# Patient Record
Sex: Female | Born: 1955 | Race: White | Marital: Married
Health system: Midwestern US, Academic
[De-identification: ages and names within clinical notes are randomized; demographics above are authoritative.]

## PROBLEM LIST (undated history)

## (undated) DIAGNOSIS — I5032 Chronic diastolic (congestive) heart failure: Principal | ICD-10-CM

## (undated) DIAGNOSIS — J302 Other seasonal allergic rhinitis: Secondary | ICD-10-CM

## (undated) DIAGNOSIS — N6452 Nipple discharge: Secondary | ICD-10-CM

## (undated) DIAGNOSIS — H8109 Meniere's disease, unspecified ear: Secondary | ICD-10-CM

## (undated) DIAGNOSIS — E1142 Type 2 diabetes mellitus with diabetic polyneuropathy: Secondary | ICD-10-CM

## (undated) DIAGNOSIS — K219 Gastro-esophageal reflux disease without esophagitis: Secondary | ICD-10-CM

## (undated) DIAGNOSIS — I10 Essential (primary) hypertension: Secondary | ICD-10-CM

## (undated) DIAGNOSIS — N1832 Chronic kidney disease, stage 3b (HCC): Secondary | ICD-10-CM

## (undated) DIAGNOSIS — H9311 Tinnitus, right ear: Secondary | ICD-10-CM

## (undated) DIAGNOSIS — R0602 Shortness of breath: Secondary | ICD-10-CM

## (undated) DIAGNOSIS — I2583 Coronary atherosclerosis due to lipid rich plaque: Secondary | ICD-10-CM

## (undated) DIAGNOSIS — G4710 Hypersomnia, unspecified: Secondary | ICD-10-CM

## (undated) DIAGNOSIS — K5900 Constipation, unspecified: Secondary | ICD-10-CM

## (undated) DIAGNOSIS — R0789 Other chest pain: Secondary | ICD-10-CM

## (undated) DIAGNOSIS — I2510 Atherosclerotic heart disease of native coronary artery without angina pectoris: Secondary | ICD-10-CM

## (undated) DIAGNOSIS — F419 Anxiety disorder, unspecified: Secondary | ICD-10-CM

## (undated) DIAGNOSIS — G4700 Insomnia, unspecified: Secondary | ICD-10-CM

## (undated) DIAGNOSIS — J309 Allergic rhinitis, unspecified: Secondary | ICD-10-CM

## (undated) DIAGNOSIS — C4359 Malignant melanoma of other part of trunk: Principal | ICD-10-CM

## (undated) DIAGNOSIS — F32A Depression, unspecified: Secondary | ICD-10-CM

## (undated) DIAGNOSIS — R9389 Abnormal findings on diagnostic imaging of other specified body structures: Secondary | ICD-10-CM

## (undated) DIAGNOSIS — I2089 Other forms of angina pectoris: Secondary | ICD-10-CM

## (undated) DIAGNOSIS — M51369 Other intervertebral disc degeneration, lumbar region without mention of lumbar back pain or lower extremity pain: Secondary | ICD-10-CM

## (undated) DIAGNOSIS — M545 Low back pain, unspecified: Secondary | ICD-10-CM

## (undated) DIAGNOSIS — R52 Pain, unspecified: Secondary | ICD-10-CM

## (undated) DIAGNOSIS — H9390 Unspecified disorder of ear, unspecified ear: Secondary | ICD-10-CM

## (undated) DIAGNOSIS — G609 Hereditary and idiopathic neuropathy, unspecified: Secondary | ICD-10-CM

## (undated) DIAGNOSIS — M5136 Other intervertebral disc degeneration, lumbar region: Secondary | ICD-10-CM

## (undated) DIAGNOSIS — E669 Obesity, unspecified: Secondary | ICD-10-CM

## (undated) DIAGNOSIS — M5134 Other intervertebral disc degeneration, thoracic region: Secondary | ICD-10-CM

## (undated) DIAGNOSIS — H6503 Acute serous otitis media, bilateral: Secondary | ICD-10-CM

## (undated) DIAGNOSIS — M5125 Other intervertebral disc displacement, thoracolumbar region: Secondary | ICD-10-CM

## (undated) DIAGNOSIS — L309 Dermatitis, unspecified: Secondary | ICD-10-CM

## (undated) DIAGNOSIS — N179 Acute kidney failure, unspecified: Secondary | ICD-10-CM

## (undated) DIAGNOSIS — E559 Vitamin D deficiency, unspecified: Secondary | ICD-10-CM

## (undated) DIAGNOSIS — M25511 Pain in right shoulder: Secondary | ICD-10-CM

## (undated) DIAGNOSIS — M791 Myalgia, unspecified site: Secondary | ICD-10-CM

## (undated) DIAGNOSIS — R112 Nausea with vomiting, unspecified: Secondary | ICD-10-CM

## (undated) DIAGNOSIS — Z1231 Encounter for screening mammogram for malignant neoplasm of breast: Secondary | ICD-10-CM

## (undated) DIAGNOSIS — R109 Unspecified abdominal pain: Secondary | ICD-10-CM

## (undated) DIAGNOSIS — M533 Sacrococcygeal disorders, not elsewhere classified: Secondary | ICD-10-CM

## (undated) DIAGNOSIS — S93492A Sprain of other ligament of left ankle, initial encounter: Secondary | ICD-10-CM

## (undated) DIAGNOSIS — E785 Hyperlipidemia, unspecified: Secondary | ICD-10-CM

## (undated) DIAGNOSIS — K9509 Other complications of gastric band procedure: Secondary | ICD-10-CM

## (undated) DIAGNOSIS — R899 Unspecified abnormal finding in specimens from other organs, systems and tissues: Secondary | ICD-10-CM

## (undated) DIAGNOSIS — L304 Erythema intertrigo: Secondary | ICD-10-CM

## (undated) DIAGNOSIS — M7918 Myalgia, other site: Secondary | ICD-10-CM

## (undated) DIAGNOSIS — Z6841 Body Mass Index (BMI) 40.0 and over, adult: Principal | ICD-10-CM

## (undated) DIAGNOSIS — R079 Chest pain, unspecified: Secondary | ICD-10-CM

## (undated) DIAGNOSIS — J069 Acute upper respiratory infection, unspecified: Secondary | ICD-10-CM

## (undated) DIAGNOSIS — M5124 Other intervertebral disc displacement, thoracic region: Secondary | ICD-10-CM

## (undated) DIAGNOSIS — N1831 Chronic kidney disease, stage 3a (HCC): Principal | ICD-10-CM

## (undated) DIAGNOSIS — Z1211 Encounter for screening for malignant neoplasm of colon: Secondary | ICD-10-CM

## (undated) DIAGNOSIS — M549 Dorsalgia, unspecified: Secondary | ICD-10-CM

## (undated) DIAGNOSIS — G8929 Other chronic pain: Secondary | ICD-10-CM

## (undated) DIAGNOSIS — M79671 Pain in right foot: Secondary | ICD-10-CM

## (undated) DIAGNOSIS — H60312 Diffuse otitis externa, left ear: Secondary | ICD-10-CM

## (undated) DIAGNOSIS — M79642 Pain in left hand: Secondary | ICD-10-CM

## (undated) DIAGNOSIS — I25118 Atherosclerotic heart disease of native coronary artery with other forms of angina pectoris: Secondary | ICD-10-CM

## (undated) DIAGNOSIS — R509 Fever, unspecified: Secondary | ICD-10-CM

## (undated) DIAGNOSIS — S62102D Fracture of unspecified carpal bone, left wrist, subsequent encounter for fracture with routine healing: Secondary | ICD-10-CM

## (undated) DIAGNOSIS — I208 Other forms of angina pectoris: Secondary | ICD-10-CM

## (undated) DIAGNOSIS — B372 Candidiasis of skin and nail: Secondary | ICD-10-CM

## (undated) DIAGNOSIS — E119 Type 2 diabetes mellitus without complications: Secondary | ICD-10-CM

## (undated) LAB — EKG 12-LEAD
Atrial Rate: 70 {beats}/min
P Axis: 54 degrees
P-R Interval: 136 ms
Q-T Interval: 432 ms
QRS Duration: 76 ms
QTc Calculation (Bazett): 466 ms
R Axis: -5 degrees
T Axis: 48 degrees
Ventricular Rate: 70 {beats}/min

## (undated) MED FILL — HYDROcodone/APAP 5-325MG TAB: 5-325 MG | 5 days supply | Qty: 20 | Fill #0 | Status: AC

## (undated) MED FILL — LINEZOLID 600MG TABS: 600 MG | 10 days supply | Qty: 20 | Fill #0 | Status: AC

---

## 2006-06-06 ENCOUNTER — Emergency Department (HOSPITAL_COMMUNITY): Admission: EM | Admit: 2006-06-06 | Discharge: 2006-06-06 | Payer: Self-pay | Admitting: Emergency Medicine

## 2008-10-27 ENCOUNTER — Emergency Department (HOSPITAL_BASED_OUTPATIENT_CLINIC_OR_DEPARTMENT_OTHER): Admission: EM | Admit: 2008-10-27 | Discharge: 2008-10-27 | Payer: Self-pay | Admitting: Emergency Medicine

## 2009-01-23 ENCOUNTER — Encounter

## 2009-01-23 NOTE — Progress Notes (Signed)
The pt is here for a f/u visit s/p adj band 9 months ago.  She started having abd pain on her right side and some vomiting at evening meal time.  After further history she is eating much too quickly at dinner time causing her occassional nausea.  Her hx of pain is c/w a pulled muscle esp. This far out from surgery.  She is still smoking which I explianed will substantially increase her risk of ulcer and erosion.      ON exam:  Her abdomen is soft and nontender.  The pain is not reproducible with palpation.  She states it hurts when she moves and lifts/moves heavy objects.  There is no erythema or wound infection which would indicate erosion.        A/P.  She was given dietary counseling re:  slowiong her eating down and told to contact us if her pain does not start improving.  We will keep her visit for 3 months from now to reasses.Marland Kitchen

## 2009-02-21 NOTE — Progress Notes (Signed)
Sheritta gained 0.6 lbs over 1 month. Healthy behaviors: portion control. Goal: Refocus. Replace protein shake drunk every day with protein-rich foods. Eat frequently, every 3-4 hours so as not to feel famished and want to overeat at night. Recommended checking out www.bariatriceating.com and www.realizemysuccess.com for recipe ideas. Recommended using the Realize website to document food intake to have analyzed by dietitians to improve weight loss outcomes. Handouts given: none. Will follow up as necessary.

## 2009-02-21 NOTE — Progress Notes (Signed)
The patient is here for a 10 month follow up status adj band.  She is having decreased restriction and increased hunger.  She underwent 30 minutes of dietary counseling and I have discussed, reviewed and agree with the dietary plan of action. She will need a fill today.  F/u in May at 1 year    The port was identified by palpation and needle aspiration to confirm needle placement in reservoir.  The fluid aspirated and injected easily.  0.5ccs were added to the band.  The patient was given dietary instructions and was able to tolerate water before leaving the office.  The patient was instructed to call for any problems.

## 2009-05-02 NOTE — Progress Notes (Signed)
Dietary Assessment Note    Vitals: Filed Vitals:    05/02/2009  2:27 PM   BP: 146/73   Pulse: 76   Height: 5\' 6"  (1.676 m)   Weight: 242 lb 5 oz (109.912 kg)      Patient lost 2.5 lbs over 2 months.    Labs reviewed: no lab studies available for review at time of visit    Protein intake: 60-80 grams/day     Fluid intake: 48-64 oz/day    Multivitamin/mineral intake: equate - how many/day: 2    Calcium intake: 1000 mg    Other: none    Exercise: Yes. How much: 2-3.5 miles a day. What kind: walking    Nutrition Assessment: 1 year post-op visit. Is engaging in some emotional eating around stress in her life. Was given a referral to a psychologist for emotional eating issues.    Food Intolerances/issues: none    Client Concerns: slow weight loss    Goals: Try fiber supplement or pearls IC     Plan: Will follow up in 3 months or as necessary.      Wilson Singer

## 2009-05-02 NOTE — Progress Notes (Signed)
The patient is here for a 1 year follow up status adj band.  She is doing well with diet, except for some emotional eating.  We have referred her to a outpatient psych for counseling.  Getting around 60gm of protein.  Exercise is 4-5 times /week.  She underwent 30 minutes of dietary counseling and I have discussed, reviewed and agree with the dietary plan of action.  She is having decreased restriction and increased hunger.  She will need a fill today.    She is off all her medications.      She will f/u in 6 months.  I spent over of counseling time with the patient.

## 2009-05-02 NOTE — Progress Notes (Signed)
Discussed referral to psychologist, patient agrees.  Also recommended f/u with PCP to discuss smoking cessation, possible use of Chantix.

## 2009-05-03 LAB — COMPREHENSIVE METABOLIC PANEL
ALT: 34 U/L (ref 10–40)
AST: 26 U/L (ref 15–37)
Albumin/Globulin Ratio: 1.4 (ref 1.1–2.2)
Albumin: 4.4 g/dL (ref 3.4–5.0)
Alkaline Phosphatase: 103 U/L — ABNORMAL HIGH (ref 25–100)
BUN: 15 mg/dL (ref 7–18)
CO2: 27 meq/L (ref 21–32)
Calcium: 10.1 mg/dL (ref 8.3–10.6)
Chloride: 108 meq/L (ref 99–110)
Creatinine: 0.9 mg/dL (ref 0.6–1.1)
GFR Est, African/Amer: >60
GFR, Estimated: >60 (ref >60)
Glucose: 86 mg/dL (ref 70–99)
Potassium: 4.1 meq/L (ref 3.5–5.1)
Sodium: 143 meq/L (ref 136–145)
Total Bilirubin: 0.5 mg/dL (ref 0.0–1.0)
Total Protein: 7.6 g/dL (ref 6.4–8.2)

## 2009-05-03 LAB — CBC
Hematocrit: 43.2 % (ref 36.0–48.0)
Hemoglobin: 14.4 g/dL (ref 12.0–16.0)
MCH: 28.8 pg (ref 26–34)
MCHC: 33.4 g/dL (ref 31–36)
MCV: 86.3 fl (ref 80–100)
MPV: 9.2 fl (ref 5.0–10.5)
Platelets: 208 10*3 (ref 135–450)
RBC: 5.01 10*6 (ref 4.0–5.2)
RDW: 14.1 % (ref 11.5–14.5)
WBC: 9.9 10*3 (ref 4.0–11.0)

## 2009-05-03 LAB — LIPID PANEL
Cholesterol, Total: 216 mg/dl — ABNORMAL HIGH (ref <200)
HDL: 53 mg/dL (ref 40–60)
LDL Calculated: 136 mg/dl — ABNORMAL HIGH (ref <100)
Triglycerides: 135 mg/dl (ref <150)
VLDL Cholesterol Calculated: 27 mg/dl

## 2009-05-03 LAB — IRON AND TIBC
Iron % Saturation: 18 % (ref 15–50)
Iron: 72 ug/dL (ref 35–150)
TIBC: 394 ug/dL (ref 260–445)

## 2009-05-03 LAB — FERRITIN: Ferritin: 61.9 ng/mL (ref 10–291)

## 2009-05-03 LAB — TSH: TSH: 1.71 u[IU]/mL (ref 0.35–5.5)

## 2009-05-03 LAB — VITAMIN B12 & FOLATE
Folate: >24 ng/ml
Vitamin B-12: 1809 pg/mL — ABNORMAL HIGH (ref 211–911)

## 2009-05-03 LAB — HEMOGLOBIN A1C
A1c: 5.3 (ref 4.0–6.0)
eAG: 105.4 mg/dl

## 2009-05-06 LAB — VITAMIN A
RETINYL PALMITATE: 0.03 mg/L (ref 0.00–0.10)
Vitamin A, Interp: NORMAL
Vitamin A: 0.67 mg/L (ref 0.30–1.20)

## 2009-05-06 LAB — ZINC: Zinc: 80 ug/dL (ref 60–120)

## 2009-05-06 LAB — VITAMIN D 25 HYDROXY: Vit D, 25-Hydroxy: 29 ng/mL — ABNORMAL LOW (ref 30–80)

## 2009-05-07 LAB — VITAMIN B1: Vit. B1, Plasma: 21 nmol/L (ref 8–30)

## 2009-07-07 DEATH — deceased

## 2009-08-07 NOTE — Progress Notes (Signed)
The patient is a 53 y.o. female who returns today for follow up. We discussed how her weight affects her overall health including:  Patient Active Problem List   Diagnoses Code   ??? Back Pain 724.5E   ??? Deep Vein Thrombosis 453.40J   ??? Depression 311L   ??? GERD (Gastroesophageal Reflux Disease) 530.81S   ??? High Cholesterol 272.0AY   ??? Hypertension 401.9AJ   ??? Pulmonary Embolism 415.19AD   ??? Nausea & Vomiting 787.01H   ??? Abdominal  Pain, Other Specified Site 789.09   ??? Morbid Obesity 278.01        The patient's current Body mass index is 39.71 kg/(m^2). (08/07/2009).  Since her last visit she has gained 3.5 lbs over 4 months. Zoua underwent thirty minutes of dietary counseling, and I have reviewed and agree with the dietary counseling, and I have reviewed and agree with the diet plan. There are no changes in the patients medical history or physical exam.   I spent a total of 15 minutes with the a patient and  Greater than 50% was face to face counseling time with the patient to discuss the issues covered in this note.    She has had some emontional eating issues with her mother passing away 1 1/2 months ago.  Not getting enough protein but exercinsg a great deal.  Needs to quit smoking and tighten up diet a bit.      The patient will follow up in 2 months

## 2009-08-07 NOTE — Progress Notes (Signed)
Dietary Assessment Note    Vitals: Filed Vitals:    08/07/2009 10:35 AM   BP: 161/89   Pulse: 74   Resp: 18   Height: 5\' 6"  (1.676 m)   Weight: 246 lb (111.585 kg)      Patient gained 3.5 lbs over 3 months.    Labs reviewed: no lab studies available for review at time of visit    Protein intake: Patient not tracking     Fluid intake: 48-64 oz/day    Multivitamin/mineral intake: Centrum equivalent - how many/day: 2    Calcium intake: 1000 mg calcium citrate    Other: Vitamin D3 and Citrucel    Exercise: Yes. How much: 3-4. What kind: treadmill and weights and racquetball.    Nutrition Assessment: band fill visit. Not tracking protein. Is craving sweets. Fluid intake optimal and compliant with MV/M.    Food Intolerances/issues: breads    Client Concerns: slow weight loss    Goals: Track protein, eliminate sweets    Plan: Will follow up as necessary.      Wilson Singer

## 2009-10-17 LAB — CBC
Hematocrit: 43.3 % (ref 36.0–48.0)
Hemoglobin: 14.6 g/dL (ref 12.0–16.0)
MCH: 29.1 pg (ref 26–34)
MCHC: 33.7 g/dL (ref 31–36)
MCV: 86.4 fl (ref 80–100)
MPV: 9.8 fl (ref 5.0–10.5)
Platelets: 209 10*3 (ref 135–450)
RBC: 5.01 10*6 (ref 4.0–5.2)
RDW: 14.3 % (ref 11.5–14.5)
WBC: 9.2 10*3 (ref 4.0–11.0)

## 2009-10-17 LAB — PHOSPHORUS: Phosphorus: 3.7 mg/dL (ref 2.5–4.9)

## 2009-10-17 LAB — TSH
T4 Free: 1.12 ng/dL (ref 0.9–1.8)
TSH: 1.45 u[IU]/mL (ref 0.35–5.5)

## 2009-10-17 LAB — LIPID PANEL
Cholesterol, Total: 224 mg/dl — ABNORMAL HIGH (ref <200)
HDL: 55 mg/dL (ref 40–60)
LDL Calculated: 139 mg/dl — ABNORMAL HIGH (ref <100)
Triglycerides: 155 mg/dl — ABNORMAL HIGH (ref <150)
VLDL Cholesterol Calculated: 31 mg/dl

## 2009-10-17 LAB — BASIC METABOLIC PANEL
BUN: 12 mg/dL (ref 7–18)
CO2: 29 meq/L (ref 21–32)
Calcium: 10.6 mg/dL (ref 8.3–10.6)
Chloride: 107 meq/L (ref 99–110)
Creatinine: 0.9 mg/dL (ref 0.6–1.1)
GFR Est, African/Amer: >60
GFR, Estimated: >60 (ref >60)
Glucose: 89 mg/dL (ref 70–99)
Potassium: 4.6 meq/L (ref 3.5–5.1)
Sodium: 144 meq/L (ref 136–145)

## 2009-10-17 LAB — HEPATIC FUNCTION PANEL
ALT: 35 U/L (ref 10–40)
AST: 27 U/L (ref 15–37)
Albumin: 4.5 g/dL (ref 3.4–5.0)
Alkaline Phosphatase: 109 U/L
Bilirubin, Direct: 0.14 mg/dL (ref 0.0–0.3)
Bilirubin, Indirect: 0.4 mg/dL (ref 0.0–1.0)
Total Bilirubin: 0.5 mg/dL (ref 0.0–1.0)
Total Protein: 7.6 g/dL (ref 6.4–8.2)

## 2009-10-17 LAB — T4, FREE: T4 Free: 1.12 ng/dL (ref 0.9–1.8)

## 2009-10-17 LAB — VITAMIN B12 & FOLATE
Folate: >24 ng/ml
Vitamin B-12: 955 pg/mL — ABNORMAL HIGH (ref 211–911)

## 2009-10-20 LAB — VITAMIN D 25 HYDROXY: Vit D, 25-Hydroxy: 33 ng/mL (ref 30–80)

## 2009-10-23 MED ORDER — TECHNETIUM TC 99M SESTAMIBI - CARDIOLITE
Freq: Once | Status: AC
Start: 2009-10-23 — End: 2009-10-23
  Administered 2009-10-23: 15:00:00 30 via INTRAVENOUS

## 2009-10-23 MED ORDER — TECHNETIUM TC 99M TETROFOSMIN IV KIT
Freq: Once | INTRAVENOUS | Status: AC
Start: 2009-10-23 — End: 2009-10-23
  Administered 2009-10-23: 14:00:00 10 via INTRAVENOUS

## 2009-10-23 NOTE — Progress Notes (Signed)
Stress test complete. Patient tolerated well. Windi Toro L Sanii Kukla,RN

## 2009-10-23 NOTE — Progress Notes (Signed)
Patient instructed on lexiscan protocol stress test procedure including possible side effects and adverse reactions. Patient verbalizes knowledge and understanding and denies having any questions.  Lynnleigh Soden L Jacoby Ritsema, RN

## 2009-10-24 MED FILL — NORMAL SALINE FLUSH 0.9 % IJ SOLN: 0.9 % | INTRAMUSCULAR | Qty: 10

## 2009-10-25 MED FILL — LEXISCAN 0.4 MG/5ML IV SOLN: 0.4 MG/5ML | INTRAVENOUS | Qty: 5

## 2009-11-07 NOTE — Progress Notes (Signed)
Dietary Assessment Note    Vitals: Filed Vitals:    11/07/2009  3:33 PM   BP: 157/90   Pulse: 81   Resp: 16   Height: 5\' 6"  (1.676 m)   Weight: 243 lb (110.224 kg)      Patient lost 3 lbs over the past three months.    Labs reviewed: labs are reviewed, up to date and normal    Protein intake: <60 grams/day     Fluid intake: 48-64 oz/day    Multivitamin/mineral intake: 2 MVI a day    Calcium intake: calcium citrate 1200mg     Other: none    Exercise: No. How much: NA. What kind: NA    Nutrition Assessment: Pt states she is very hungry and is not feeling any restriction. She states her sleeping pattern is off and she is eating larger amounts much less often than before. Her protein intake is not adequate and she is noticing some hair thinning. Encouraged pt to eat 4-5 times a day no more than 4 hours apart. Pt states she is drinking about one pop per week.    Food Intolerances/issues: hard breads    Client Concerns: not feeling restriction- fears band is almost full    Goals: cut out pop, take 30 mins to eat, eat 4-5 times a day    Plan: follow up as needed      Allin Frix [facility]

## 2009-11-07 NOTE — Progress Notes (Signed)
The patient is here for post-operative visit following a 19 month follow up status adj band.  She has been having a great deal of stress.  She has started smoking again.  She is drinking diet soda a great deal and is not exercising much.  She underwent an evaluation for cardiac problems due to her stress.  Her work up was negative (per patient)  We discussed how her weight affects their health and possible medical problems, including.    Past Medical History   Diagnosis Date   . Chronic back pain    . Deep vein thrombosis    . Depression    . GERD (gastroesophageal reflux disease)    . Hyperlipidemia    . HYPERTENSION    . Pulmonary embolism    . Obesity          Filed Vitals:    11/07/2009  3:33 PM   BP: 157/90   Pulse: 81   Resp: 16         Abdomen:  Soft NT/ND, no rebound/gaurding.  Wounds healing well.      The patient underwent 30 minutes of dietary counseling and I have discussed, reviewed and agree with the dietary plan of action.   We discussed the importance of following the dietary recommendations.  Also, we discussed the benefits of attending support groups and the online support program.    All questions were answered.    The patient will follow up in 2 months and have reviewed blood work.    I spent over 20 minutes of face to face counseling time with the patient.

## 2010-02-06 IMAGING — CR DG HIP (WITH OR WITHOUT PELVIS) 2-3V*L*
3 series · 3 of 3 positions shown · non-contrast
Comparison: None

CLINICAL DATA: Left leg pain.  Fall.

LEFT HIP - COMPLETE 2+ VIEW

[t pelvis a.p.]
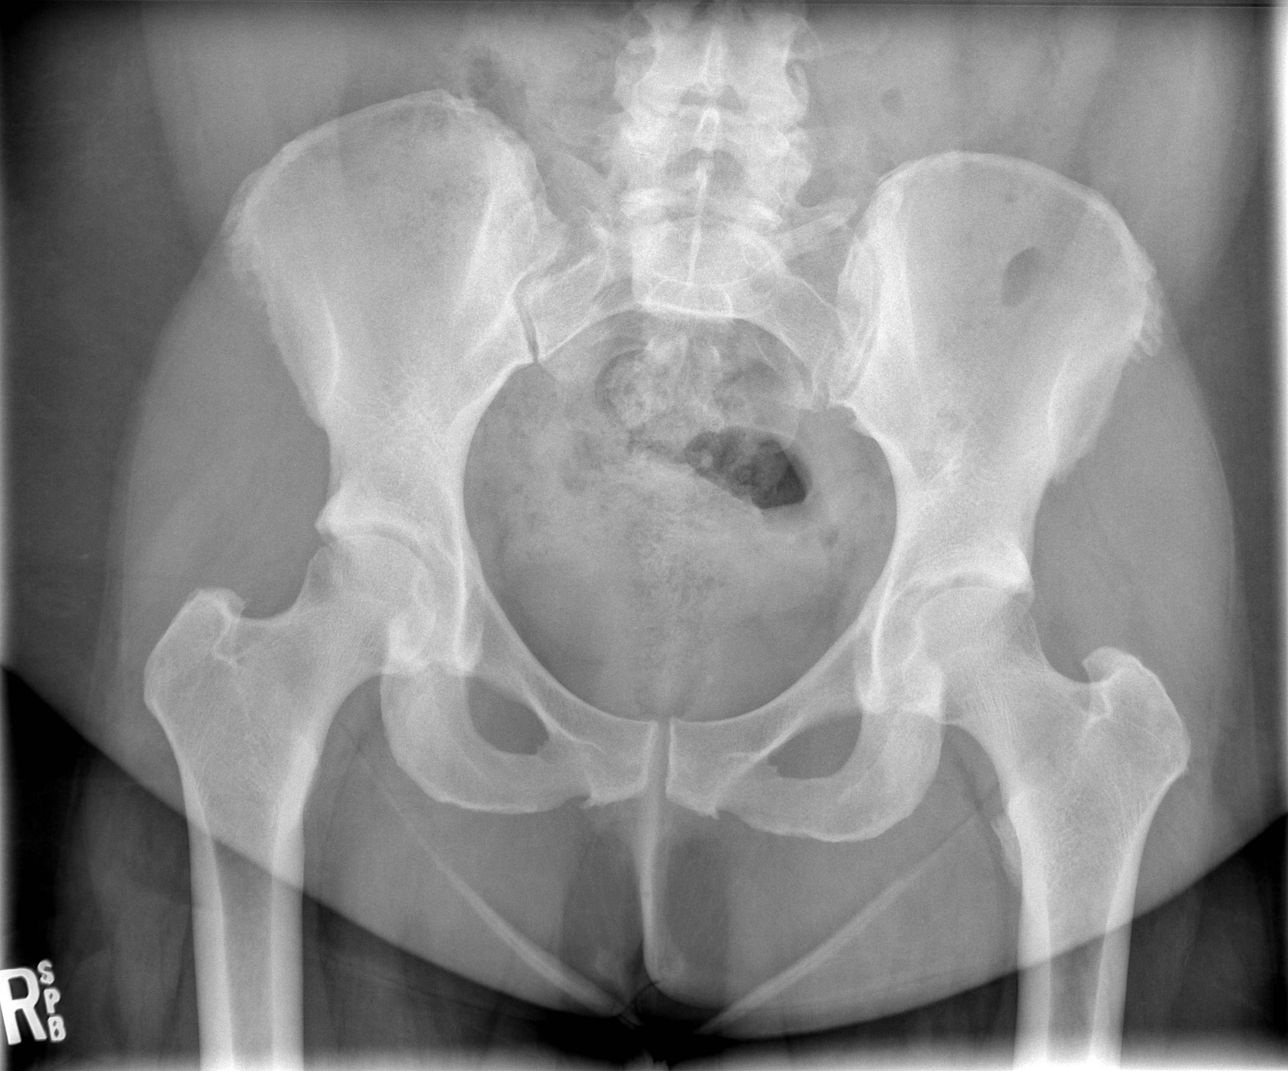

[t hip ap left]
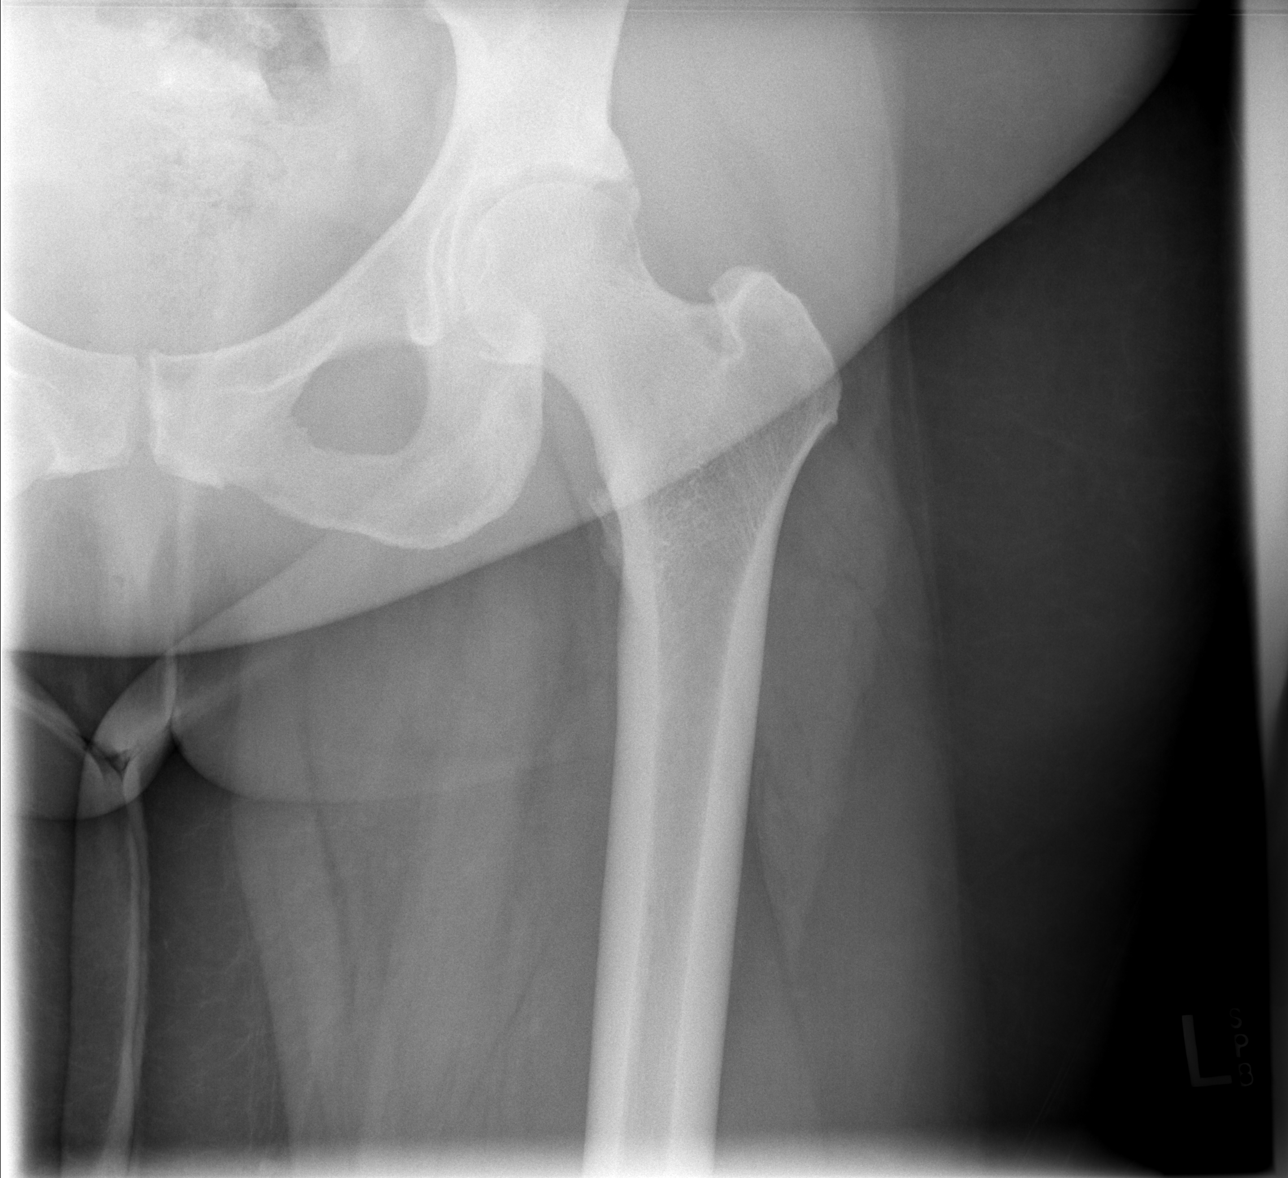

[t hip frog leg left]
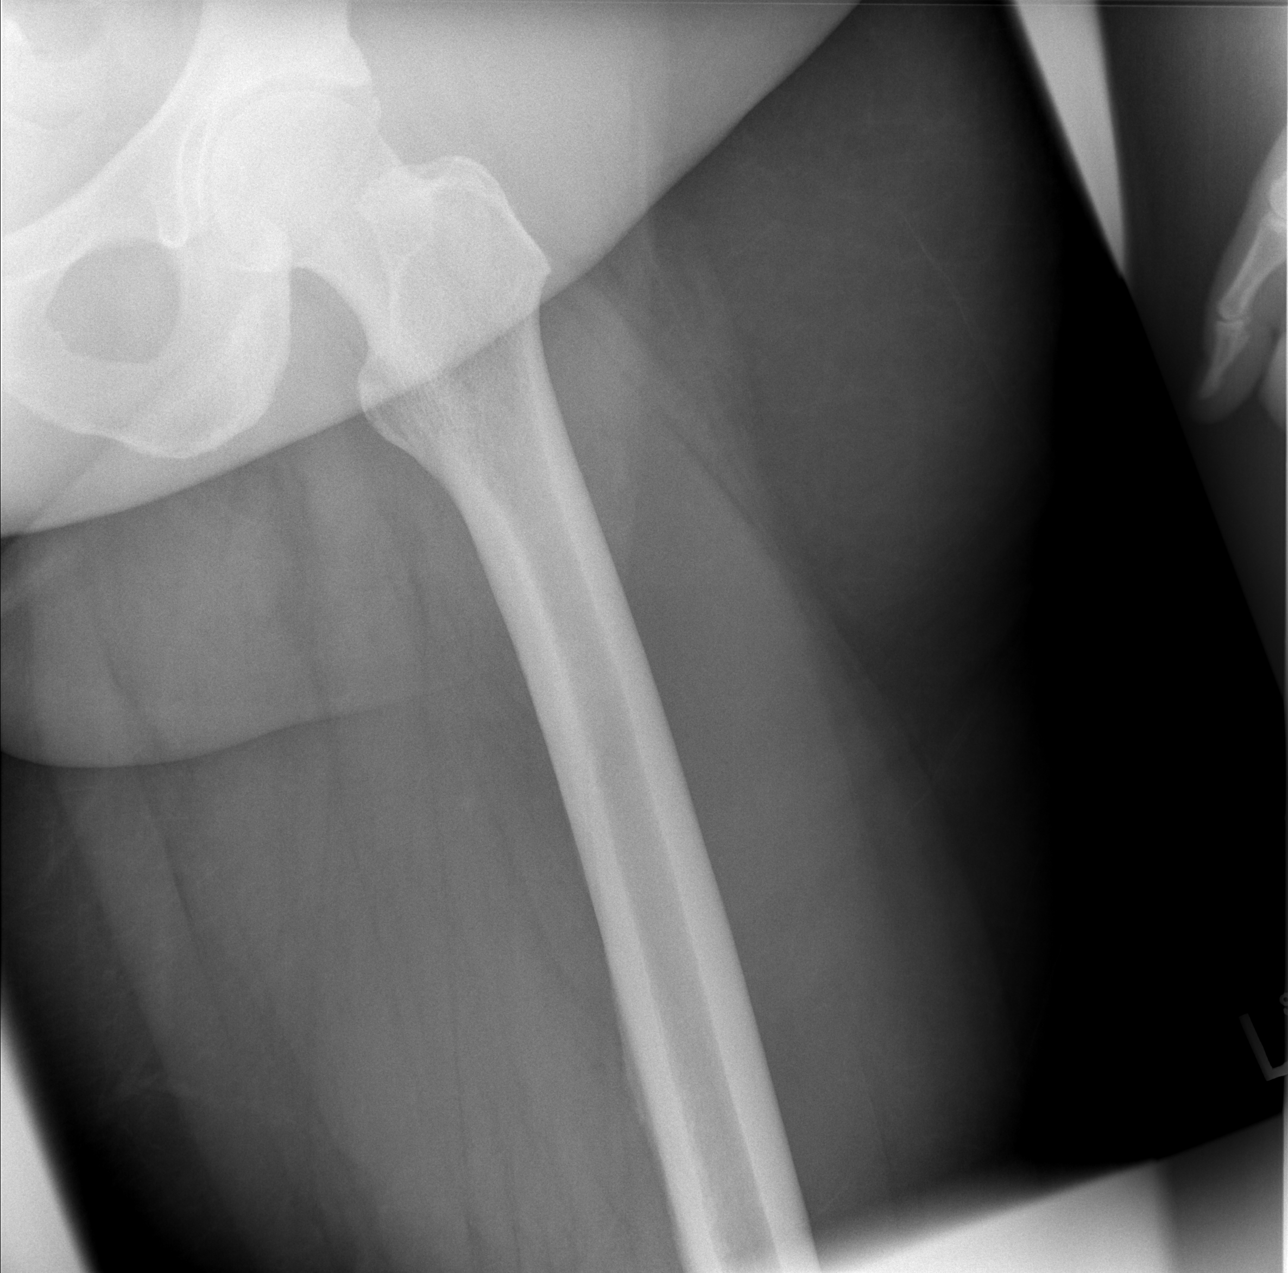

[3 of 3 positions shown; findings below may reference images not displayed]

FINDINGS: Pelvic rings appear intact.  Both hips appear located
with symmetric joint spaces.  Enthesopathy is noted at the
parasymphyseal pubis and iliac crests bilaterally.  The cortical
irregularity is present along the left lesser trochanter on the
frontal view but this appears normal on the lateral view.  No
fracture is identified.
IMPRESSION: No acute osseous trauma to the pelvis or left hip.

## 2010-02-06 IMAGING — CR DG KNEE COMPLETE 4+V*L*
4 series · 4 of 4 positions shown · non-contrast
Comparison: None available

CLINICAL DATA: Fall off ladder.  Left leg pain.

LEFT KNEE - COMPLETE 4+ VIEW

[t knee ap left]
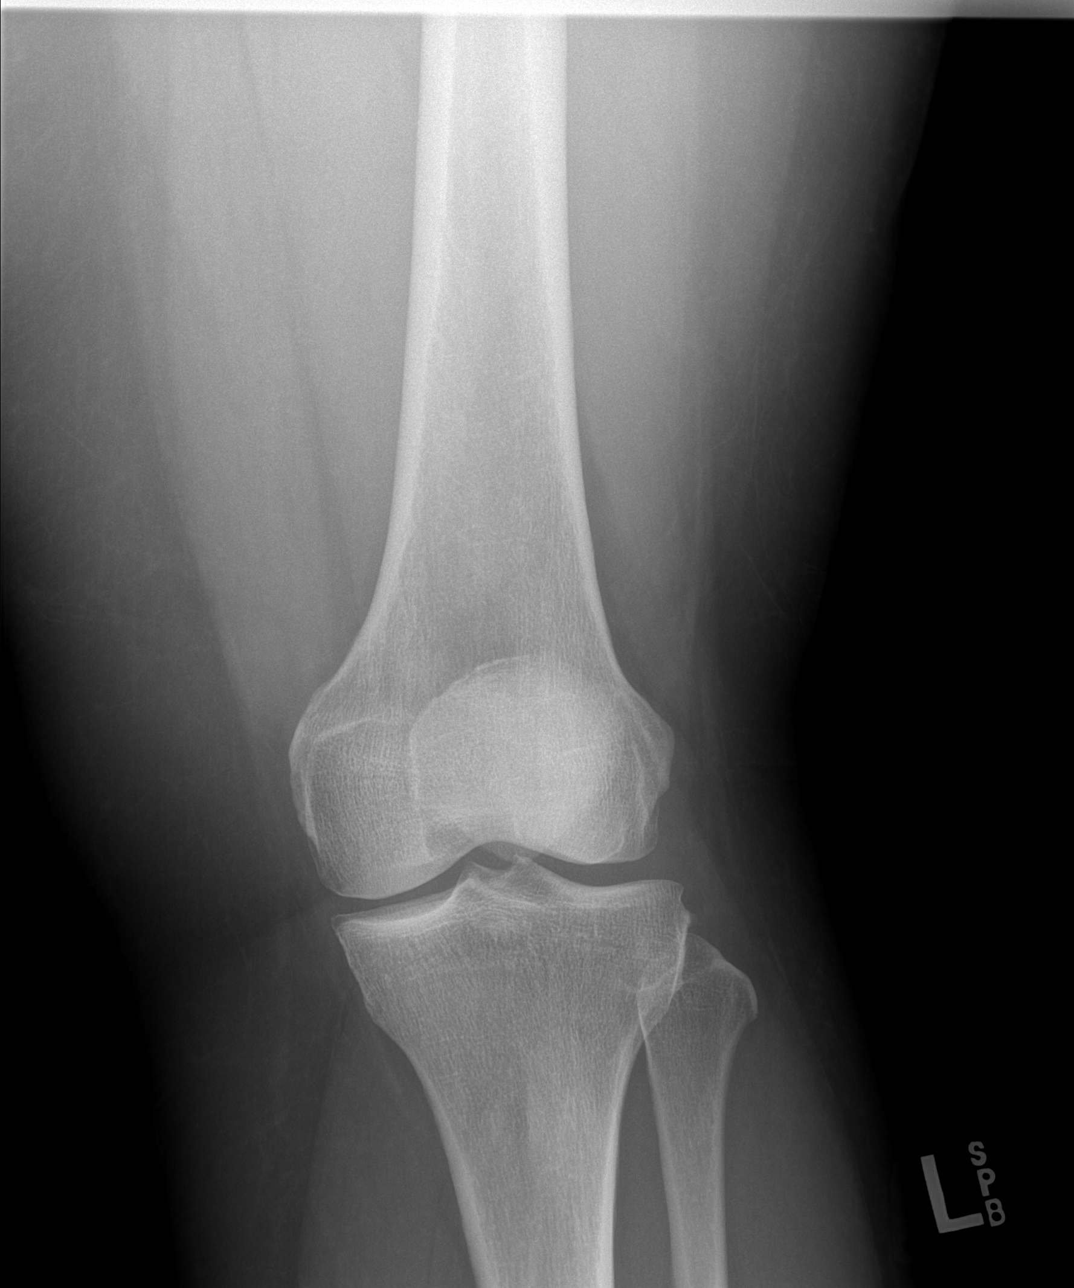

[t knee oblique left (1 of 2)]
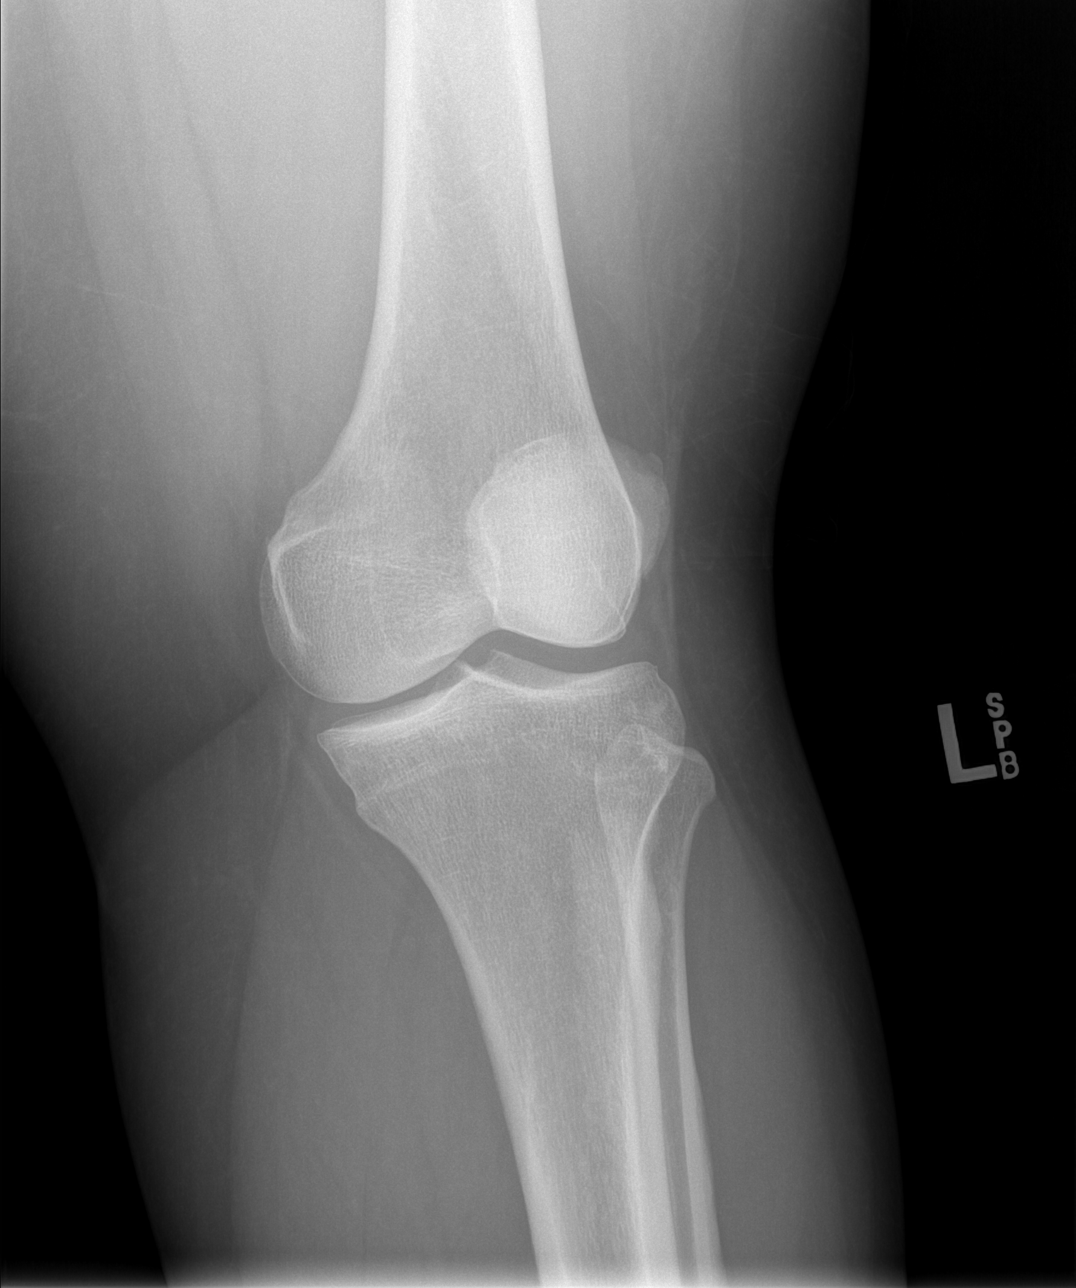

[t knee oblique left (2 of 2)]
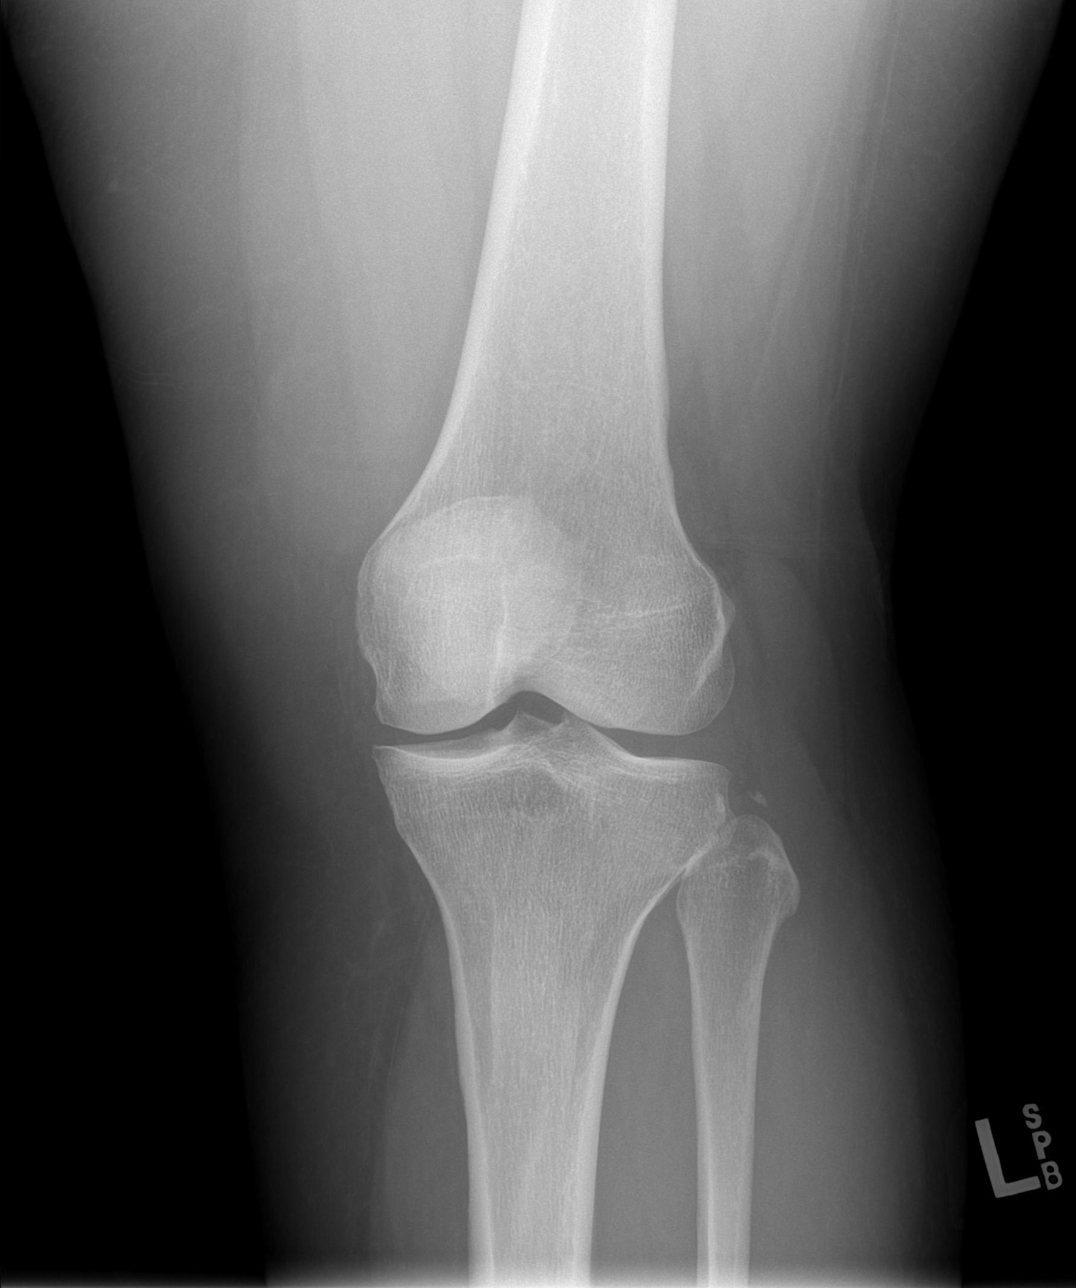

[t knee lat left]
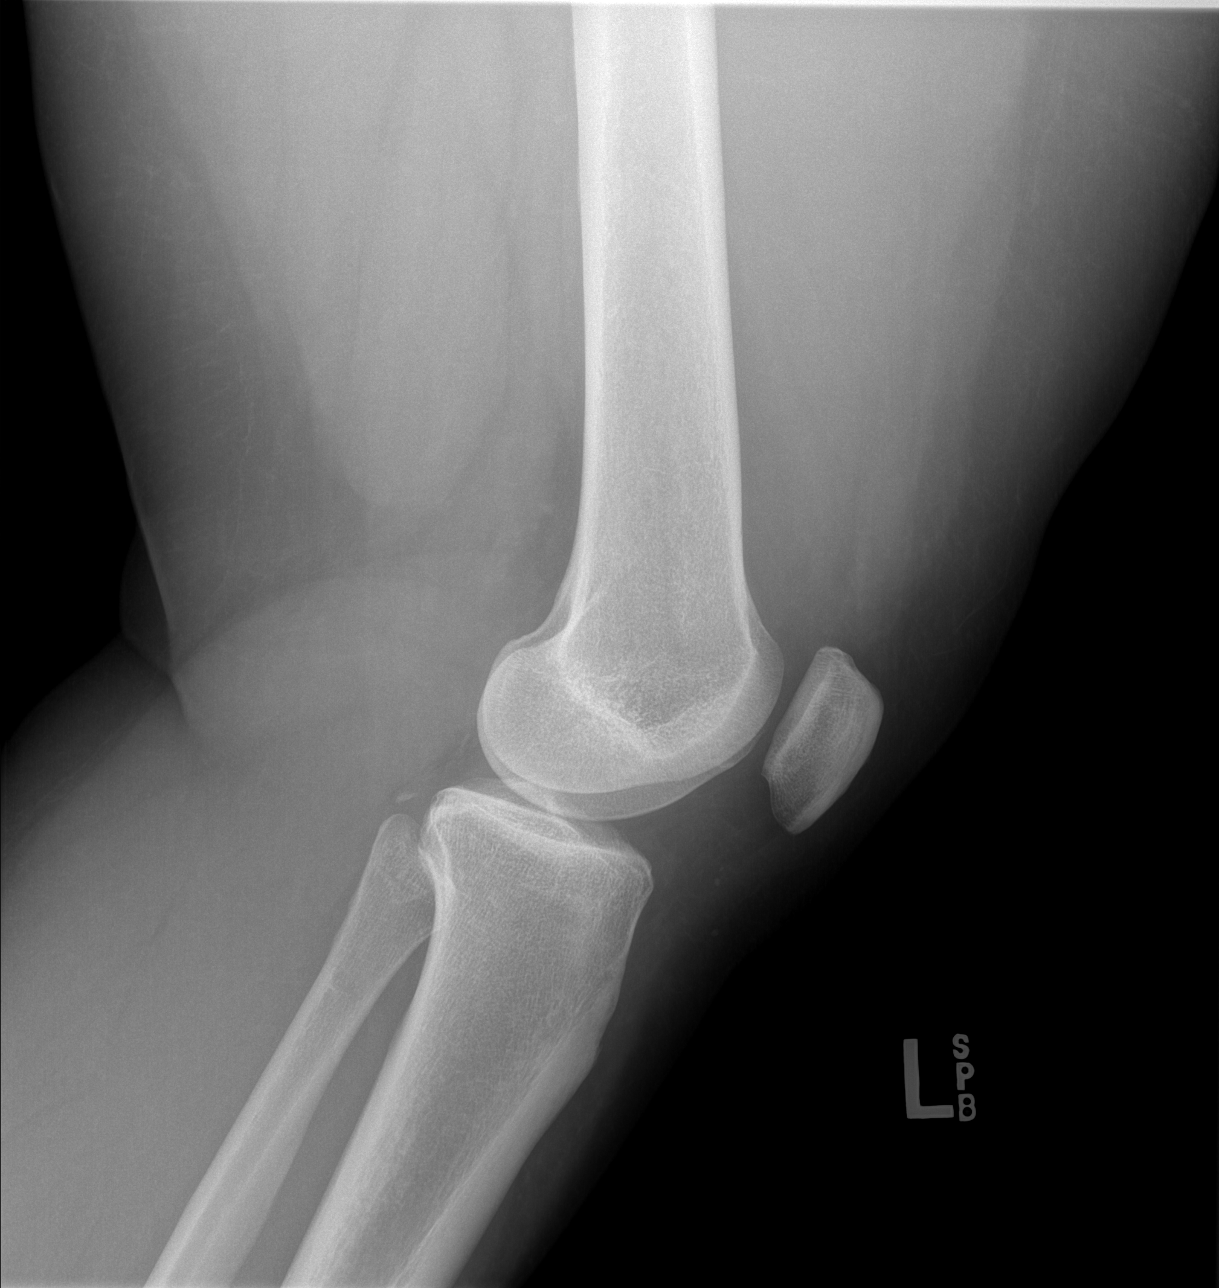

[4 of 4 positions shown; findings below may reference images not displayed]

FINDINGS: Anatomic alignment of the left knee.  No fracture or
osteonecrosis.  Tiny focus of ossification is noted adjacent to the
fibular head.  This is superior to the fibula on the lateral view.
This could represent a tiny avulsion.  Based on the margins, this
is age indeterminate.  No radiographic evidence of effusion.
IMPRESSION: 1.  No definite evidence of acute osseous trauma.
2.  Age indeterminate bone fragment adjacent fibular head possibly
representing avulsion.  Correlate clinically with site of
tenderness.

## 2010-02-06 NOTE — Progress Notes (Signed)
Dietary Assessment Note    Vitals: Filed Vitals:    02/06/2010  3:34 PM   BP: 177/89   Pulse: 89   Resp: 18   Height: 5\' 6"  (1.676 m)   Weight: 248 lb 3 oz (112.577 kg)      Patient gained 5 lbs over the past 3 months.    Labs reviewed: no lab studies available for review at time of visit    Protein intake: 60-80 grams/day     Fluid intake: 48-64 oz/day    Multivitamin/mineral intake: centrum    Calcium intake: calcium citrate 1200mg     Other: Vitamin D3    Exercise: Yes. How much: daily. What kind: video and walking    Nutrition Assessment:  post-op visit. Pt is eating only a few times daily. She is exercising everyday with an exercise video and walking daily. She is most likely under consuming and often waiting much longer than 4-5 hours between meals. She is not meeting her protein needs.     Food Intolerances/issues: none    Client Concerns: not losing    Goals: eat 4-5 times daily, include a protein at each meal, send diet recall after 1 month    Plan: follow up in 1 month      Cristie HemAshleigh N Melchor Kirchgessner

## 2010-02-06 NOTE — Progress Notes (Signed)
The patient is here for post-operative visit following a 2 year follow up status adj band.  She has had some nausea and diarrhea.  Does not seem to have any relation to her band.  She is down 60lbs since her surgery.  She is increasing her exercise.  Not eating meals frequent enough.  Needs to quit smoking. Overall doing well.    We discussed how her weight affects their health and possible medical problems, including.    Past Medical History   Diagnosis Date   ??? Chronic back pain    ??? Deep vein thrombosis    ??? Depression    ??? GERD (gastroesophageal reflux disease)    ??? Hyperlipidemia    ??? HYPERTENSION    ??? Pulmonary embolism    ??? Obesity          Filed Vitals:    02/06/2010  3:34 PM   BP: 177/89   Pulse: 89   Resp: 18         Abdomen:  Soft NT/ND, no rebound/gaurding.  Wounds healing well.      The patient underwent 30 minutes of dietary counseling and I have discussed, reviewed and agree with the dietary plan of action.   We discussed the importance of following the dietary recommendations.  Also, we discussed the benefits of attending support groups and the online support program.    All questions were answered.    The patient will follow up in 1 months and will send for blood work.    I spent over 20 minutes of face to face counseling time with the patient.

## 2010-02-17 NOTE — Telephone Encounter (Signed)
Pt emailed on 02/13/10 with a detailed food recall, asking for suggestions. The food recall is in the patient's record. My response email was as follows:    Hi Rachael Mays,  I looked over your diet recall, and the other dietitian, Trudie BucklerHeidi, looked through it as well. Here are some of the things we noticed:    There were a few times near the beginning of your recall where you were waiting a little too long to eat. Once on 3/3 from 2pm-9pm and once on 3/4 from 12am -6pm. Make sure that at any point you are not awake and waiting longer than 4-5 hours to eat.     The zone protein bar is approximately 200calories, 15g of sugar and 7g of fat. Given that it only provides 14g of protein, this is not a good choice. In general, protein bars are usually not a good choice. The only one that we have found that we will accept is Pure Protein. Don't be confused, this is not a great choice, but it is acceptable 1-2 times per week max.    Also, the chai tea. Where is that from? We had one patient ask for chai tea that was a good choice and it was very difficult to find. Usually, they are high in sugar, and if it is a latte from [employer]bucks or another coffee shop it is usually a bit worse.    Lastly, we noticed that the majority of your meals contain high fat or high sugar items. This may be what is holding you back the most. There also seem to be a lot of fried foods. Even in small portions, fried foods are unacceptable because they contain a lot of calories in the small amount.    Here are some examples of fried foods from your menu: chips, chimichanga, white castle, onion rings, hush puppies, fried fish, chicken livers    Additionally, high fat items from your menu: donuts (also high sugar) potato or macaroni salad, tartar sauce, mayonnaise, fruit pizza (also high sugar)    In general, aim to choose more whole foods. Examples: baked chicken, low fat ground meats, lean pork tenderloin, baked or broiled fish, tuna, whole grain breads, rice, or  pasta without high fat sauces, steamed or boiled vegetables (keep in mind that corn and peas have more carb than other vegetables), eggs, yogurt, low fat cheese/ cottage cheese, etc.     I hope this helps!

## 2010-03-26 NOTE — Progress Notes (Signed)
Subjective:      Patient ID: Rachael Mays is a 54 y.o. female.    HPI - 2 yrs s/p gastric banding    Review of Systems   Constitutional: Negative.    HENT: Negative.    Eyes: Negative.    Respiratory: Negative.    Cardiovascular: Negative.    Gastrointestinal: Negative.    Genitourinary: Negative.    Musculoskeletal: Negative.    Neurological: Negative.    Hematological: Negative.    Psychiatric/Behavioral: Negative.        Objective:   Physical Exam   Constitutional: She is oriented to person, place, and time. She appears well-developed and well-nourished.   HENT:   Head: Normocephalic.   Eyes: Conjunctivae are normal. Pupils are equal, round, and reactive to light.   Neck: Normal range of motion.   Pulmonary/Chest: Effort normal.   Abdominal: Soft.        Incisions well healed, port palpable.   Musculoskeletal: Normal range of motion.   Neurological: She is alert and oriented to person, place, and time.   Skin: Skin is warm and dry.   Psychiatric: She has a normal mood and affect. Her behavior is normal. Judgment and thought content normal.       Assessment:            Plan:      Patient is 2 yrs s/p gastric banding, down 58.3lbs.  She denies n/v/dysphagia or reflux.  She is doing well, she does struggle with dietary compliance at times.  She did start Lexapro for depression and has noticed an improvement in mood.  She did meet with registered dietitian for continued follow up.  I agree with recommendations and plan.  She does report increased hunger and less restriction, will add fluid to band.  The port was identified by palpation and needle aspiration to confirm needle placement in reservoir.?? The fluid aspirated and injected easily.?? 0.25ccs were added to the band for a total of 10.25ccs.?? The patient was given post adjustment dietary instructions and was able to tolerate water without difficulty before leaving the office.?? The patient was instructed to call for any problems.  Will recheck bloodwork.  I have  spent over 15 min counseling patient.  We will see her back in 2-3 months for continued follow up.

## 2010-03-26 NOTE — Progress Notes (Signed)
Dietary Assessment Note    Vitals:   Filed Vitals:    03/26/10 1540   BP: 151/77   Pulse: 66   Resp: 16   Height: 5\' 6"  (1.676 m)   Weight: 241 lb 2 oz (109.374 kg)    Patient lost 7.1 lbs over the past month.    Labs reviewed: labs are reviewed, up to date and normal    Protein intake: 60-80 grams/day     Fluid intake: 48-64 oz/day    Multivitamin/mineral intake: Centrum equivalent 2 a day    Calcium intake: calcium citrate 2 a day    Other: Vitamin D3    Exercise: Yes. How much: 2 miles a day. What kind: walking    Nutrition Assessment: 1 month follow up post-op visit. Pt states that she is eating about every 3 hours but gets sick sometimes because she is too full.  She states that she is tighter in the morning and does not always eat anything first thing in the morning.  She states that she eats sweets everyday as that is something that she has bee accustomed to all of her life.  She also consistently is eating protein bars.    Food Intolerances/issues: none    Client Concerns: Feels like she needs a fill today as she is eating more than a cup of food at the time.    Goals: Decrease sweets to not more than once a week.  Decrease use of protein bars to once or twice a month.  Keep a diet recall to assess food intake.    Plan: Follow up as needed.      Azzan Butler

## 2010-03-27 LAB — CBC
Hematocrit: 45.7 % (ref 36.0–48.0)
Hemoglobin: 15.1 gm/dl (ref 12.0–16.0)
MCH: 28.8 pg (ref 26–34)
MCHC: 33.1 gm/dl (ref 31–36)
MCV: 87.1 fl (ref 80–100)
MPV: 9.7 fl (ref 5.0–10.5)
Platelets: 193 10*3 (ref 135–450)
RBC: 5.25 10*6 — ABNORMAL HIGH (ref 4.0–5.2)
RDW: 14.1 % (ref 11.5–14.5)
WBC: 7.9 10*3 (ref 4.0–11.0)

## 2010-03-27 LAB — IRON AND TIBC
Iron Saturation: 24 % (ref 15–50)
Iron: 93 ug/dl (ref 35–150)
TIBC: 397 ug/dl (ref 260–445)

## 2010-03-27 LAB — HEMOGLOBIN A1C
A1c: 5.2 (ref 4.0–6.0)
eAG: 102.5 mg/dl

## 2010-03-27 LAB — LIPID PANEL
Cholesterol, Total: 219 mg/dl — ABNORMAL HIGH (ref <200)
HDL: 53 mg/dl (ref 40–60)
LDL Calculated: 141 mg/dl — ABNORMAL HIGH (ref <100)
Triglycerides: 126 mg/dl (ref <150)
VLDL Cholesterol Calculated: 25 mg/dl

## 2010-03-27 LAB — COMPREHENSIVE METABOLIC PANEL
ALT: 31 U/L (ref 10–40)
AST: 27 U/L (ref 15–37)
Albumin/Globulin Ratio: 1.4 (ref 1.1–2.2)
Albumin: 4.7 gm/dl (ref 3.4–5.0)
Alkaline Phosphatase: 115 U/L (ref 45–129)
BUN: 15 mg/dl (ref 7–18)
CO2: 28 mEq/L (ref 21–32)
Calcium: 10 mg/dl (ref 8.3–10.6)
Chloride: 107 mEq/L (ref 99–110)
Creatinine: 0.8 mg/dl (ref 0.6–1.1)
GFR Est, African/Amer: >60
GFR, Estimated: >60 (ref >60)
Glucose: 99 mg/dl (ref 70–99)
Potassium: 4 mEq/L (ref 3.5–5.1)
Sodium: 142 mEq/L (ref 136–145)
Total Bilirubin: 0.6 mg/dl (ref 0.0–1.0)
Total Protein: 8 gm/dl (ref 6.4–8.2)

## 2010-03-27 LAB — TSH: TSH: 1.31 u[IU]/mL (ref 0.35–5.5)

## 2010-03-27 LAB — VITAMIN B12 & FOLATE
Folate: >24 ng/ml
Vitamin B-12: 820 pg/ml (ref 211–911)

## 2010-03-28 LAB — VITAMIN D 25 HYDROXY: Vit D, 25-Hydroxy: 39 ng/ml (ref 30–80)

## 2010-03-30 LAB — VITAMIN B1: Vit. B1, Plasma: 21 nmol/L (ref 8–30)

## 2010-04-01 NOTE — Patient Instructions (Signed)
Name_______________________________________Printed:____________________  Date and time of surgery__5/3 0800______________________Arrival Time:_0645_______________   1. Do not eat or drink anything after 12 midnight (or____hours) prior to surgery. This includes no water, chewing gum or mints.   2. Take the following pills will a small sip of water on the morning of surgery_albuterol____________________________________________________   3. Aspirin, Ibuprofen, Advil, Naproxen, Vitamin E and other Anti-inflammatory products should be stopped for 5 days before surgery or as directed by your physician.   4. Check with your Doctor regarding stopping Plavix, Coumadin, Lovenox, Fragmin or other blood thinners   5. Do not smoke, and do not drink any alcoholic beverages 24 hours prior to surgery.  This includes NA Beer.   6. You may brush your teeth and gargle the morning of surgery.  DO NOT SWALLOW WATER   7. You MUST make arrangements for a responsible adult to take you home after your surgery. You will not be allowed to leave alone or drive yourself home.  It is strongly suggested someone stay with you the first 24 hrs. Your surgery will be cancelled if you do not have a ride home.   8. A parent/legal guardian must accompany a child scheduled for surgery and plan to stay at the hospital until the child is discharged.  Please do not bring other children with you.   9. Please wear simple, loose fitting clothing to the hospital.  Do not bring valuables (money, credit cards, checkbooks, etc.) Do not wear any makeup (including no eye makeup) or nail polish on your fingers or toes.   10. DO NOT wear any jewelry or piercings on day of surgery.  All body piercing jewelry must be removed.   11. If you have ___dentures, they will be removed before going to the OR; we will provide you a container.  If you wear ___contact lenses or ___glasses, they will be removed; please bring a case for them.   12. Please see your family  doctor/pediatrician for a history & physical and/or concerning medications.  Bring any test results/reports from your physician's office.   PCP__________________Phone___________H&P Appt. Date________   13. Remember to bring Blood Bank bracelet to the hospital on the day of surgery.   14. If you have a Living Will and Durable Power of Attorney for Healthcare, please bring in a copy.   15. Notify your Surgeon if you develop any illness between now and surgery  time, cough, cold, fever, sore throat, nausea, vomiting, etc.  Please notify your surgeon if you experience dizziness, shortness of breath or blurred vision between now & the time of your surgery   16. DO NOT shave your operative site 96 hours prior to surgery. For face & neck surgery, men may use an electric razor 48 hours prior to surgery.   17. Shower the night before surgery with ___Antibacterial soap ___Hibiclens   18. To provide excellent care visitors will be limited to one in the room at any given time.               19.  Please bring picture ID and insurance card.    20.  Visit our web site for additional information:  [email or web address].                21.During flu season no children under the age of 58 are permitted in the hospital for the safety of all patients.    Other___________________________________________________________     *Please call pre admission testing if you any further questions  Dareen Piano         [phone]   [place] [phone]   Vickii Chafe            [phone]    [place]  [phone]   [facility]   [phone]                                                                                                        Patient/Rep____________________                                                                       Per phone                                                             PRE OP INSTRUCTIONS

## 2010-04-08 MED ORDER — ONDANSETRON HCL 4 MG/2ML IJ SOLN
4 MG/2ML | Freq: Once | INTRAMUSCULAR | Status: AC | PRN
Start: 2010-04-08 — End: 2010-04-08

## 2010-04-08 MED ORDER — MIDAZOLAM HCL 1 MG/ML IJ SOLN
1 | INTRAMUSCULAR | Status: DC | PRN
Start: 2010-04-08 — End: 2010-04-09

## 2010-04-08 MED ORDER — HYDROMORPHONE HCL 2 MG/ML IJ SOLN
2 MG/ML | INTRAMUSCULAR | Status: DC | PRN
Start: 2010-04-08 — End: 2010-04-09

## 2010-04-08 MED ORDER — LIDOCAINE HCL (PF) 1 % IJ SOLN
1 | Freq: Once | INTRAMUSCULAR | Status: AC | PRN
Start: 2010-04-08 — End: 2010-04-08

## 2010-04-08 MED ORDER — HYDRALAZINE HCL 20 MG/ML IJ SOLN
20 MG/ML | INTRAMUSCULAR | Status: DC | PRN
Start: 2010-04-08 — End: 2010-04-09

## 2010-04-08 MED ORDER — LABETALOL HCL 5 MG/ML IV SOLN
5 MG/ML | INTRAVENOUS | Status: DC | PRN
Start: 2010-04-08 — End: 2010-04-09

## 2010-04-08 MED ADMIN — lactated ringers infusion: INTRAVENOUS | @ 12:00:00 | NDC 00338011704

## 2010-04-08 NOTE — Progress Notes (Signed)
ALL DISCHARGE INFORMATION EXPLAINED TO PT AND HER SISTER (PER PT'S PERMISSION) AND BOTH VOICED UNDERSTANDING. PT'S ABD. IS SOFT AND SHE IS PASSING FLATUS WITHOUT PROBLEM. INFORMATION EXPLAINED INCLUDED POC, PAIN MANAGEMENT, DIET AND ACTIVITY.

## 2010-04-08 NOTE — Anesthesia Pre-Procedure Evaluation (Signed)
Rachael Mays     Anesthesia Evaluation     Patient summary reviewed and Nursing notes reviewed    No history of anesthetic complications   Airway   Mallampati: II  TM distance: >3 FB  Neck ROM: full  Dental - normal exam     Pulmonary - normal exam   (+) asthma, shortness of breath,   (-) sleep apnea  ROS comment: History of pulm embolism s/p IVC filter  Smoking for 15 years  Cardiovascular - normal exam  Exercise tolerance: good  (-) hypertension, past MI, CAD    Neuro/Psych    GI/Hepatic/Renal    (-) GERD, PUD, hepatitis, liver disease, renal disease    Endo/Other    (-) hypothyroidism    Comments: Hyertension,GERD ,hyperlipidemia controlled with lap banding done a year ago.  Abdominal  - normal exam              Anesthesia Plan    ASA 3     MAC   (NPO after midnight)  Anesthetic plan and risks discussed with patient.    Plan discussed with CRNA.        Johney Maine  04/08/2010

## 2010-04-08 NOTE — Op Note (Signed)
[facility]  Endoscopy Note    Patient: Rachael Mays  DOB: 02-05-1956  Acct#: [id number]    Procedure: Colonoscopy    Date:  04/08/2010    Surgeon:  Dollene Primrose, MD    Referring Physician:  Kathrin Greathouse    Preoperative Diagnosis:    1) Personal history of colon polyps         2) Family history of colon cancer    Postoperative Diagnosis:  1) Normal colon.       2) Internal hemorrhoids    Anesthesia:  TIVA/MAC per anesthesia     Indications: This is a 54 y.o. year old female who presents today with previous adenomatous polyp  family history of colon cancer and rectal bleeding.      Procedure:   An informed consent was obtained from the patient after explanation of indications, benefits, possible risks and complications of the procedure.  The patient was then taken to the endoscopy suite, placed in the left lateral decubitus position, and the above IV anesthesia was administered.    A digital rectal examination was performed and revealed negative without mass, lesions or tenderness.      The Olympus CFQ-180-AL video colonoscope was placed in the patient's rectum under digital direction and advanced to the cecum. The cecum was identified by characteristic anatomy and ballottment.  The prep was good.      The scope was then withdrawn back through the cecum, ascending, transverse, descending and sigmoid colons.  Carefull circumferential examination of the mucosa in these areas demonstrated normal colonic mucosa throughout.   The scope was then withdrawn into the rectum and retroflexed.  The retroflexed view of the anal verge and rectum demonstrates no abnormalities.  The scope was straightened, the colon was decompressed and the scope was withdrawn from the patient.  The patient tolerated the procedure well and was taken to the PACU in good condition.    Impression:  Normal colonoscopy to the cecum with no evidence of neoplasia, diverticular disease or mucosal abnormality.    Recommendations:  Repeat  colonoscopy in 5 years.  Follow up with primary care physician.  Will have patient evaluated in office for hemorrhoid banding    Dollene Primrose, MD   [facility]  04/08/2010

## 2010-04-08 NOTE — Progress Notes (Signed)
PT TO PACU-PT RESPONDS TO VOICE-REPORT RECEIVED AND MONITORS IN PLACE.

## 2010-04-08 NOTE — Progress Notes (Signed)
PT MOVED TO PHASE 2.

## 2010-04-08 NOTE — Anesthesia Post-Procedure Evaluation (Signed)
Anesthesia Post-op Note    Patient: Rachael Mays    Procedure(s) Performed: colonoscopy    Anesthesia type: MAC    Patient location: PACU    Post-op pain: Adequate analgesia.        Post-op assessment: no apparent anesthetic complications.    Cardiovascular, respiratory function and airway patency are stable.    Nausea and Vomiting are controlled. Adequate perioperative fluid hydration.    Post-op vital signs: stable    BP 165/84   Pulse 74   Temp(Src) 99.4 ??F (37.4 ??C) (Temporal)   Resp 22   Ht 5\' 6"  (1.676 m)   Wt 240 lb (108.863 kg)   BMI 38.74 kg/m2   SpO2 97%   Breastfeeding? No     Level of consciousness: awake.  Mental status returned to baseline    Complications: none    Patient has met criteria for discharge.       Johney Maine, MD  04/08/2010

## 2010-04-08 NOTE — Progress Notes (Signed)
Report given to J. Benzing RN to complete hand-off procedure.

## 2010-04-08 NOTE — Discharge Instructions (Signed)
ANESTHESIA DISCHARGE INSTRUCTIONS     Wear your seatbelt home.   You are under the influence of drugs-do not drink alcohol,drive,operate machinery,or make any important decisions for 24 hours.Children should not ride [employer] or play on gym sets  for 24 hours after surgery.   A responsible adult needs to be with you for 24 hours.   You may experience lightheadedness,dizziness,or sleepiness following surgery.   Rest at home today- increase activity as tolerated.   Progress slowly to a regular diet unless your physician has instructed you otherwise.Drink plenty of water.   If nausea becomes a problem call your physician.   Coughing,sore throat,and muscle aches are other side effects of anesthesia,and should improve with time.   Do not drive,operate machinery while taking narcotics.       ENDOSCOPY DISCHARGE INSTRUCTIONS     You may experience some lightheadedness for the next several hours.   Plan on quiet relaxation for the rest of today.   A responsible adult needs to stay with you today.   Because of the medications you received today-do not drive,operate machinery,or sign any contractual agreement for the next 24 hours.   Do not drink any alcoholic beverages or take any unprescribed medications tonight.   Eat bland food and avoid anything greasy or spicy initially-progress to your normal diet gradually.   Diet restrictions as instructed.   If you have any of the following problems, notify your physician or return to the hospital emergency room : fever, chills, excessive bleeding, excessive vomiting, difficulty swallowing, uncontrolled pain, increased abdominal distention, shortness of breath or any other problems.   If you have a sore throat, you may use lozenges or salt water gargles.   Call your doctor for pathology results and/or follow up appointment.     CALL DR Daphine Deutscher FOR ANY QUESTIONS OR CONCERNS AT [phone]        Colonoscopy   WHAT YOU SHOULD KNOW:    A colonoscopy is a  procedure that examines the colon (large bowel). Caregivers use a colonoscope, which is a soft, bendable tube with a light and tiny camera on the end. A colonoscope takes pictures of the inside of the colon and may be seen on a TV-like screen. The colon is the long tube that connects the small bowel with the anus (opening through which stool passes). The colon absorbs water from digested foods and turns the digested food into stool. It stores the stool until it passes out through your anus.             In a colonoscopy, diseases and conditions that affect the colon may be diagnosed. These may include diseases that cause changes in bowel movements (BM), bleeding, blockages, inflammation (swelling), or infections. It is also used to look for polyps (growths) or early signs of cancer (tumor) in the colon or rectum (rear end). A biopsy may be done where a small amount of tissue is taken from the bowel and examined. Your caregiver may also remove stool that is blocking your colon. With a colonoscopy, conditions of the colon, such as blockages or polyps, may be diagnosed and treated, and their symptoms relieved.   AFTER YOU LEAVE:   Medicines:    Keep a written list of the medicines you take, the amounts, and when and why you take them. Bring the list of your medicines or the pill bottles when you see your caregivers. Learn why you take each medicine. Ask your caregiver for information about your medicine.  Do not use any medicines, over-the-counter drugs, vitamins, herbs, or food supplements without first talking to caregivers.     Always take your medicine as directed by caregivers. Call your caregiver if you think your medicines are not helping or if you feel you are having side effects. Do not quit taking your medicines until you discuss it with your caregiver. If you are taking medicine that makes you drowsy, do not drive or use heavy equipment.    Pain medicine: You may be given medicine to take at home to take  away or decrease pain. Your caregiver will tell you how much to take and how often to take it. Take the medicine exactly as directed by your caregiver. Do not wait until the pain is too bad before taking your medicine. The medicine may not work as well at controlling your pain if you wait too long to take it. Tell caregivers if the pain medicine does not help, or if your pain comes back too soon.      Stool softeners: You may be given stool softeners to soften your bowel movements, making them easier to pass.   Ask your caregiver when to return for a follow-up visit. Keep all appointments. Write down any questions you may have. This way you will remember to ask these questions during your next visit.   Ask your caregiver when the results of your procedure will be available.    Bowel movements: Exercise such as walking can help you have regular bowel movements. Including foods such as fruit, bran, and prune juice, and drinking enough water can also help. Caregivers may give you fiber medicine or a stool softener to help make your BMs softer and more regular.   Diet: Eat a variety of healthy foods from all the food groups every day. Include whole grain bread, cereal, rice and pasta. Eat a variety of fruits and vegetables, including dark green and orange vegetables and legumes (dry beans). Include dairy products such as low-fat milk, yogurt and cheese. Choose protein sources such as lean meat and poultry (chicken), fish, beans, eggs and nuts. Ask your caregiver how many servings of fats, oils, and sweets you may have each day, and if you need to be on a special diet.   Drinking liquids: Men 52 years old and older should drink about 3 Liters of liquid each day (about 13 eight-ounce cups). Women 52 years old and older should drink about 2 Liters of liquid each day (about 9 eight-ounce cups). Good choices for most people to drink include water, juice, and milk. If you are used to drinking liquids that contain caffeine,  such as coffee, these can also be counted in your daily liquid amount. Some food items such as soup and fruit also add liquid to your diet. Ask your caregiver how much liquid you should have each day.   Rest: You may feel like resting more. Slowly start to do more each day. Rest when you feel it is needed.   CONTACT A CAREGIVER IF:    You have a feeling of being too full or bloated.     You have a fever (increased body temperature).      You have nausea (upset stomach) or vomiting (throwing up).     You are unable to have a BM.     You have questions or concerns about your procedure, condition, or care.   SEEK CARE IMMEDIATELY IF:    You have problems having a bowel movement or  passing urine.     You have trouble breathing all of a sudden.     Your abdomen becomes tender and hard.     Your signs and symptoms are getting worse.     Your stools are black or have blood in them.     Your vomit (throw up) has blood or bile in it.   Copyright  2009. [employer]. All rights reserved. Information is for End User's use only and may not be sold, redistributed or otherwise used for commercial purposes.  The above information is an educational aid only. It is not intended as medical advice for individual conditions or treatments. Talk to your doctor, nurse or pharmacist before following any medical regimen to see if it is safe and effective for you.

## 2010-04-08 NOTE — Progress Notes (Signed)
Teaching / education initiated regarding perioperative experience, expectations, and pain management during stay. Patient verbalized understanding.

## 2010-04-08 NOTE — H&P (Signed)
[facility]    Pre-operative History and Physical    Patient: Rachael Mays  DOB: [birth year]  Acct#: [id number]    History Obtained From:  patient, electronic medical record    HISTORY OF PRESENT ILLNESS:    The patient is a 54 y.o. female with significant past medical history of asthma who presents with previous adenomatous polyp, family history of colon cancer, rectal bleeding.    Past Medical History:        Diagnosis Date   ??? Chronic back pain    ??? Deep vein thrombosis    ??? Depression    ??? GERD (gastroesophageal reflux disease)      NO LONGER SINCE LAP   ??? Hyperlipidemia    ??? Pulmonary embolism    ??? Obesity    ??? HYPERTENSION      no problems since lap SURGERY     Past Surgical History:        Procedure Date   ??? Cesarean section    ??? Cholecystectomy    ??? Hysterectomy    ??? Shoulder surgery    ??? Lap band 05/01/08     Dr. Suzi Roots   ??? Back surgery      neck  plates     Medications Prior to Admission:   Current Outpatient Prescriptions on File Prior to Encounter   Medication Sig Dispense Refill   ??? escitalopram (LEXAPRO) 10 MG tablet Take 10 mg by mouth daily.         ??? Multiple Vitamin (MULTIVITAMIN PO) Take  by mouth.       ??? albuterol (PROVENTIL;VENTOLIN) 90 MCG/ACT inhaler Inhale 2 puffs into the lungs every 6 hours as needed.       ??? calcium carbonate (OSCAL) 500 MG TABS tablet Take 500 mg by mouth daily.       ??? Cholecalciferol (VITAMIN D) 2000 UNIT TABS Take  by mouth.            Allergies:  Talwin    History of allergic reaction to anesthesia:  No    Social History:   TOBACCO:   reports that she has been smoking cigarettes.  She has a 17.5 pack-year smoking history. She has never used smokeless tobacco.  ETOH:   reports that she drinks alcohol.  DRUGS:   reports that she does not use illicit drugs.    Family History:       Problem Relation Age of Onset   ??? Diabetes Other    ??? High Blood Pressure Other    ??? Obesity Other    ??? Heart Disease Mother    ??? Cancer Father      colon       PHYSICAL  EXAM:      BP 165/84   Pulse 74   Temp(Src) 99.4 ??F (37.4 ??C) (Temporal)   Resp 22   Ht 5\' 6"  (1.676 m)   Wt 240 lb (108.863 kg)   BMI 38.74 kg/m2   SpO2 97%   Breastfeeding? No I        Heart:  Normal apical impulse, regular rate and rhythm, normal S1 and S2, no S3 or S4, and no murmur noted    Lungs:  No increased work of breathing, good air exchange, clear to auscultation bilaterally, no crackles or wheezing    Abdomen:  Normal bowel sounds, soft, non-distended, non-tender, no masses palpated, no hepatosplenomegally      ASA Grade:  ASA 3 - Patient with  moderate systemic disease with functional limitations      ASSESSMENT AND PLAN:    1.  Patient is a 54 y.o. female here for colonoscopy with anesthesia  2.  Procedure options, risks and benefits reviewed with patient.  Patient expresses understanding.

## 2010-04-08 NOTE — Progress Notes (Signed)
PT DENIES ANY CONCERNS-UNDERSTANDS THAT SHE MUST PASS FLATUS BUR HAS NOT YET-ABD SOFT WITH ACTIVE BOWEL SOUNDS. FAMILY AT BEDSIDE. DR Daphine DeutscherMARTIN IN TO SHARE REPORT WITH PT AND HER FAMILY.

## 2010-04-08 NOTE — Progress Notes (Signed)
Reviewed problem list, assessment, H&P note and checklist preoperatively.

## 2010-04-09 MED FILL — FENTANYL CITRATE 0.05 MG/ML IJ SOLN: 0.05 MG/ML | INTRAMUSCULAR | Qty: 5

## 2010-04-09 MED FILL — PROPOFOL 10 MG/ML IV EMUL: 10 MG/ML | INTRAVENOUS | Qty: 40

## 2011-03-31 LAB — COMPREHENSIVE METABOLIC PANEL
ALT: 31 U/L (ref 10–40)
AST: 28 U/L (ref 15–37)
Albumin/Globulin Ratio: 1.4 (ref 1.1–2.2)
Albumin: 4.3 gm/dl (ref 3.4–5.0)
Alkaline Phosphatase: 107 U/L (ref 45–129)
BUN: 16 mg/dl (ref 7–18)
CO2: 27 mEq/L (ref 21–32)
Calcium: 9.7 mg/dl (ref 8.3–10.6)
Chloride: 110 mEq/L (ref 99–110)
Creatinine: 0.9 mg/dl (ref 0.6–1.1)
GFR Est, African/Amer: >60
GFR, Estimated: >60 (ref >60)
Glucose: 85 mg/dl (ref 70–99)
Potassium: 4.4 mEq/L (ref 3.5–5.1)
Sodium: 143 mEq/L (ref 136–145)
Total Bilirubin: 0.6 mg/dl (ref 0.0–1.0)
Total Protein: 7.3 gm/dl (ref 6.4–8.2)

## 2011-03-31 LAB — CBC WITH AUTO DIFFERENTIAL
Basophils %: 0.4 % (ref 0.0–2.0)
Basophils Absolute: 0 10*3 (ref 0.0–0.2)
Eosinophils %: 3.7 % (ref 0.0–5.0)
Eosinophils Absolute: 0.3 10*3 (ref 0.0–0.6)
Granulocyte Absolute Count: 4.9 10*3 (ref 1.7–7.7)
Hematocrit: 41.8 % (ref 36.0–48.0)
Hemoglobin: 14.1 gm/dl (ref 12.0–16.0)
Lymphocytes %: 34.2 % (ref 25.0–40.0)
Lymphocytes Absolute: 3.1 10*3 (ref 1.0–5.1)
MCH: 28.8 pg (ref 26–34)
MCHC: 33.6 gm/dl (ref 31–36)
MCV: 85.5 fl (ref 80–100)
MPV: 9.6 fl (ref 5.0–10.5)
Monocytes %: 7.3 % (ref 0.0–12.0)
Monocytes Absolute: 0.7 10*3 (ref 0.0–1.3)
Platelets: 181 10*3 (ref 135–450)
RBC: 4.89 10*6 (ref 4.0–5.2)
RDW: 14.5 % (ref 11.5–14.5)
Segs Relative: 54.4 % (ref 42.0–63.0)
WBC: 9.1 10*3 (ref 4.0–11.0)

## 2011-03-31 LAB — LIPID PANEL
Cholesterol, Total: 204 mg/dl — ABNORMAL HIGH (ref <200)
HDL: 52 mg/dl (ref 40–60)
LDL Calculated: 125 mg/dl — ABNORMAL HIGH (ref <100)
Triglycerides: 132 mg/dl (ref <150)
VLDL Cholesterol Calculated: 26 mg/dl

## 2011-03-31 LAB — TSH: TSH: 1.42 u[IU]/mL (ref 0.35–5.5)

## 2011-05-15 ENCOUNTER — Inpatient Hospital Stay
Admit: 2011-05-15 | Disposition: A | Discharge status: Home / Self Care | Source: Home / Self Care | Admitting: Family Medicine

## 2011-05-15 LAB — POCT CHEM BASIC W ICA
GFR Est, African/Amer: >60
GFR, Estimated: >60 (ref >60)
POC BUN: 15 mg/dl (ref 7–18)
POC CO2: 22 mEq/L (ref 21–32)
POC Chloride: 108 mEq/L (ref 99–110)
POC Creatinine: 0.8 mg/dl (ref 0.6–1.1)
POC Glucose: 88 mg/dl (ref 70–99)
POC Ionized Calcium: 1.05 mmol/L — ABNORMAL LOW (ref 1.12–1.32)
POC Potassium: 3.5 mEq/L (ref 3.5–5.1)
POC Sodium: 142 mEq/L (ref 136–145)

## 2011-05-15 LAB — CBC WITH AUTO DIFFERENTIAL
Basophils %: 0.7 % (ref 0.0–2.0)
Basophils Absolute: 0.1 10*3 (ref 0.0–0.2)
Eosinophils %: 3.2 % (ref 0.0–5.0)
Eosinophils Absolute: 0.3 10*3 (ref 0.0–0.6)
Granulocyte Absolute Count: 5.1 10*3 (ref 1.7–7.7)
Hematocrit: 42.7 % (ref 36.0–48.0)
Hemoglobin: 14 gm/dl (ref 12.0–16.0)
Lymphocytes %: 38.1 % (ref 25.0–40.0)
Lymphocytes Absolute: 3.9 10*3 (ref 1.0–5.1)
MCH: 28.8 pg (ref 26–34)
MCHC: 32.8 gm/dl (ref 31–36)
MCV: 87.8 fl (ref 80–100)
MPV: 9.2 fl (ref 5.0–10.5)
Monocytes %: 7.9 % (ref 0.0–12.0)
Monocytes Absolute: 0.8 10*3 (ref 0.0–1.3)
Platelets: 206 10*3 (ref 135–450)
RBC: 4.86 10*6 (ref 4.0–5.2)
RDW: 14.3 % (ref 11.5–14.5)
Segs Relative: 50.1 % (ref 42.0–63.0)
WBC: 10.2 10*3 (ref 4.0–11.0)

## 2011-05-15 LAB — TROPONIN: Troponin I: <0.01 ng/ml

## 2011-05-15 LAB — POCT TROPONIN: POC Troponin I: 0.01 ng/ml (ref 0.00–0.10)

## 2011-05-15 LAB — PROTIME-INR
INR: 0.95
Protime: 10.9 s

## 2011-05-15 LAB — APTT: aPTT: 28.3 s

## 2011-05-15 MED ORDER — ONDANSETRON HCL 4 MG/2ML IJ SOLN
4 MG/2ML | Freq: Once | INTRAMUSCULAR | Status: AC
Start: 2011-05-15 — End: 2011-05-15
  Administered 2011-05-15: 05:00:00 via INTRAVENOUS

## 2011-05-15 MED ORDER — NITROGLYCERIN 0.4 MG SL SUBL
0.4 MG | SUBLINGUAL | Status: AC | PRN
Start: 2011-05-15 — End: 2011-05-15
  Administered 2011-05-15 (×3): 0.4 mg via SUBLINGUAL

## 2011-05-15 MED ORDER — OMEPRAZOLE 20 MG PO CPDR
20 MG | ORAL_CAPSULE | Freq: Every day | ORAL | Status: DC
Start: 2011-05-15 — End: 2011-11-07

## 2011-05-15 MED ORDER — SODIUM CHLORIDE 0.9 % IV SOLN
0.9 % | INTRAVENOUS | Status: DC
Start: 2011-05-15 — End: 2011-05-15
  Administered 2011-05-15: 05:00:00 via INTRAVENOUS

## 2011-05-15 MED ADMIN — technetium sestamibi (CARDIOLITE) injection 30 milli Curie: INTRAVENOUS | @ 19:00:00

## 2011-05-15 MED ADMIN — technetium tetrofosmin (Tc-MYOVIEW) injection 10 milli Curie: INTRAVENOUS | @ 14:00:00 | NDC 17156002405

## 2011-05-15 MED ADMIN — ondansetron (ZOFRAN) injection 4 mg: INTRAVENOUS | @ 17:00:00 | NDC 00703722101

## 2011-05-15 MED ADMIN — ioversol (OPTIRAY) 68 % injection 125 mL: INTRAVENOUS | @ 06:00:00 | NDC 00019132306

## 2011-05-15 MED ADMIN — acetaminophen (TYLENOL) tablet 650 mg: ORAL | @ 05:00:00 | NDC 50580050130

## 2011-05-15 MED ADMIN — omeprazole (PRILOSEC) capsule 20 mg: ORAL | @ 13:00:00 | NDC 51079000701

## 2011-05-15 MED ADMIN — HYDROmorphone (DILAUDID) injection 2 mg: 2 mg | INTRAVENOUS | @ 13:00:00 | NDC 00409131230

## 2011-05-15 MED ADMIN — HYDROmorphone (DILAUDID) injection 2 mg: 2 mg | INTRAVENOUS | @ 21:00:00 | NDC 00409131230

## 2011-05-15 MED ADMIN — HYDROmorphone (DILAUDID) injection 2 mg: 2 mg | INTRAVENOUS | @ 09:00:00 | NDC 00409131230

## 2011-05-15 MED FILL — NITROSTAT 0.4 MG SL SUBL: 0.4 MG | SUBLINGUAL | Qty: 1

## 2011-05-15 MED FILL — HYDROMORPHONE HCL 2 MG/ML IJ SOLN: 2 MG/ML | INTRAMUSCULAR | Qty: 1

## 2011-05-15 MED FILL — ONDANSETRON HCL 4 MG/2ML IJ SOLN: 4 MG/2ML | INTRAMUSCULAR | Qty: 2

## 2011-05-15 MED FILL — SODIUM CHLORIDE 0.9 % IV SOLN: 0.9 % | INTRAVENOUS | Qty: 1000

## 2011-05-15 MED FILL — OMEPRAZOLE 20 MG PO CPDR: 20 mg | ORAL | Qty: 1

## 2011-05-15 MED FILL — ACETAMINOPHEN 325 MG PO TABS: 325 MG | ORAL | Qty: 2

## 2011-05-15 MED FILL — NORMAL SALINE FLUSH 0.9 % IJ SOLN: 0.9 % | INTRAMUSCULAR | Qty: 10

## 2011-05-15 NOTE — Progress Notes (Signed)
Call placed to DR [facility] answering service re stress lab results

## 2011-05-15 NOTE — Progress Notes (Signed)
Patient discharged following review of discharge instructions. Patient denied any questions regarding discharge instructions, medications and verbalized understanding.. Patient discharged to home. Patient taken to car in wheelchair.  Macie Burows RN

## 2011-05-15 NOTE — Progress Notes (Signed)
Returns from stress lab  Denies pain states nausea some better

## 2011-05-15 NOTE — Progress Notes (Signed)
Cont to discomfort -chest into mid back steady ache not associated with sob or nausea  Med with dilaudid as that helped before  gxt explained

## 2011-05-15 NOTE — Progress Notes (Signed)
Pt reports same back discomfort-hard aching no associted sob or nausea requests pain shot and dilaudid given

## 2011-05-15 NOTE — H&P (Signed)
PATIENT NAME:                 PA #:            MR #DEALIE, KOELZER                 [id number]       [id number]            ATTENDING PHYSICIAN:                  ADM DATE:   DIS DATE:          Guss Bunde, MD                   05/15/2011                     DATE OF BIRTH:   AGE:           PATIENT TYPE:     RM #:              05/07/1956       54             IPF               [id number]                  HISTORY OF PRESENT ILLNESS:  This is a 55 year old female who presented to  the emergency room with severe sudden onset of back and chest pain.  She says  she was in her normal state of health, when she went to work.  She did not do  anything.  She was just doing her normal job, not a big deal.  She does not  recall any injury and all of the sudden she had severe, sharp, stabbing pain  across her bra line and all the way across her back.  It was really severe  for a time.  She felt short of breath.  It, kind of, felt like when she had a  pulmonary embolus in the past.   She got, kind of, diaphoretic and nauseous  with it but then it, kind of, calmed down but never really went away.  She  continued to work.  It did not get worse.  She actually was trying to walk  around to see if she could get it to go away, and it would not get better but  it did not get worse on activity.  Then she thought it was finally easing up  some and then came on again, much more strongly this time.  Into the chest as  well and down both arms, and she became concerned and so came into the  emergency room.  She states that otherwise she has been in her normal state  of heath.  She has had a little bit of allergy symptoms recently, but not a  big deal.  No cough, cold, fever, chills, nausea, vomiting, diarrhea.  Bowels  and bladder have been working okay until all of this started.  She did state  that when this all happened initially she had several episodes of watery  diarrhea.  No blood associated with the pain.       PAST MEDICAL  HISTORY:  Is significant for DVT after surgery with history of  PE when she was on OCPs.  She actually had a filter placed before she had her  Lap-Band surgery.       ALLERGIES:  She is allergic to TALWIN which causes hallucinations.       SOCIAL HISTORY:  She smokes less than a pack a day, does not drink alcohol.   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     PREVIOUS SURGERIES:  Include C-spine fusion, cholecystectomy, Lap-Band,  [place] filter, a lumpectomy which was benign in her left breast, a right  rotator cuff and a hysterectomy.     FAMILY HISTORY:  Ovarian cancer, breast cancer, colon cancer and coronary  artery disease in mother that died at 72 and maternal grandmother that died  in her late 66s.  She also had her father than had a heart attack as well.     MEDICATIONS:  Include: 1.  Allergy medications, over the counter.  2.  Citalopram 20 mg a day.  3.  Multivitamin.  4.  Calcium.       PHYSICAL EXAMINATION:  GENERAL:  Alert, pleasant, oriented x4, in no acute distress.  She still has  a little bit of back pain but no chest pain.  It has been gone since she was  in the ER.  VITAL SIGNS:  She is afebrile, respirations 18, pulse 58, blood pressure  148/80, 96% on room air, 233 pounds.  HEENT:  Pupils equal, round and reactive to light.  Extraocular movements are  intact; conjunctivae are slightly pink and watery.  Throat is clear.  NECK:  No adenopathy.  No thyromegaly.  No bruit.  LUNGS:  Are clear.  HEART:  Regular rate and rhythm without murmurs.  ABDOMEN:  Soft, nontender, nondistended without organomegaly and normal bowel  sounds.  EXTREMITIES:  Without edema.  BACK:  Is nontender to palpation.  SKIN:  No rashes or lesions are noted.     LABORATORY STUDIES:  She had 2 sets of negative cardiac enzymes, normal EKG,  all taken while she was having the pain in the ER.  She had normal chemistry  and CBC.  She had a CAT scan of the chest which showed no acute pulmonary  embolism, no aortic  dissection and otherwise was a normal CAT scan of the  chest.     IMPRESSION:  Chest pain and thoracic back pain.  We have ruled out all the  serious causes such as dissection, pulmonary embolus.  She has a stress test  later today, although her enzymes and electrocardiogram looked fine while she  was having the pain.  It could be gastrointestinal related, maybe esophageal  spasm.  It did not relieve with nitroglycerin.  She is felling better now,  has had no recurrence of the pain other than some lingering back tenderness.   If her stress test is negative, will plan on sending her home and I will  start her on a proton pump inhibitor.                                            Haislee Corso Georgina Snell, MD     [id number]  DD: 05/15/2011 14:28   DT: 05/15/2011 16:24   Job #: [id number]  CC: Guss Bunde, MD  CC: Usha Slager Georgina Snell, MD

## 2011-05-15 NOTE — Plan of Care (Signed)
Problem: KNOWLEDGE DEFICIT  Goal: Patient/S.O. demonstrates understanding of disease process, treatment plan, medications, and discharge instructions.  Intervention: PROVIDE TEACHING AT LEVEL OF UNDERSTANDING  Pt instructed on poc and gxt

## 2011-05-15 NOTE — Progress Notes (Signed)
Admission complete, see doc flow sheet. Patient resting in bed. VSS. Patient denies any needs at this time. Patient encouraged to call if needs arise.  Macie Burows RN

## 2011-05-15 NOTE — Progress Notes (Signed)
Returns from resting scan  No chest discomfort at this time

## 2011-05-15 NOTE — Progress Notes (Signed)
Patient instructed on Bruce Protocol Stress Test Procedure including possible side effects and adverse reactions.  Verbalizes knowledge and understanding and denies having any questions.

## 2011-05-15 NOTE — Progress Notes (Signed)
To stress lab by wc  No chest discomfort butt does co nausea-zofran given

## 2011-05-15 NOTE — Discharge Instructions (Signed)
Your information:  Name: Rachael Mays  DOB: May 24, 1956    Your instructions:    Thank you for choosing [facility]!  You will receive a survey in the mail regarding the care you received during your stay.  Your input is valuable to [place]. Our goal is that the care you received was excellent. We hope that you will definitely recommend [place] to your friends and family and choose [place] for your future healthcare needs.      What to do after you leave the hospital:    Recommended diet:general    Recommended activity: as tolerated    If you experience any of the following symptoms CHEST DISCOMFORT, DIFFICULTY BREATHING, NAUSEA , DIZZINESS, UNUSUAL WEAKNESSplease follow up with DR SLOUGH    Follow-up with DR SLOUGH  [phone].    The following personal items were collected during your admission and were returned to you:    Valuables  Dentures: None  Vision - Corrective Lenses: Glasses  Hearing Aid: None  Jewelry: Ring;Bracelet  Body Piercings Removed: Yes  Clothing: Footwear;Pants;Shirt;Undergarments (Comment)  Were All Patient Medications Collected?: Not Applicable  Other Valuables: Cell phone  Valuables Given To: Patient  Provide Name(s) of Who Valuable(s) Were Given To: none  Responsible person(s) in the waiting room?: none    Information obtained by:  By signing below, I understand that if any problems occur once I leave the hospital I am to contact MY DOCTOR OR GO TO THE EMERGENCY ROOM  I understand and acknowledge receipt of the instructions indicated above.     Tips to Help You Stop Smoking   Cigarette smoking is a preventable cause of death in the [place]. If you have thought about quitting but haven't been able to, here are some reasons why you should and some ways to do it.    Here's Why   Quitting smoking now can decrease your risk of getting smoking-related illnesses like:     Heart disease      Stroke     Several types of cancer, including:      Lung     Mouth     Esophagus     Larynx     Bladder      Pancreas     Kidney     Chronic lung diseases:      Bronchitis     Emphysema     Asthma     Cataracts      Macular degeneration      Thyroid conditions      Hearing loss      Erectile dysfunction      Dementia     Osteoporosis   Here's How    Once you've decided to quit smoking, set your target quit date a few weeks away. In the time leading up to your quit day, try some of these ideas offered by the Tobacco Control Research Branch of the National Cancer Institute to help you successfully quit smoking.    For the best results, work with your doctor. Together, you can test your lung function and compare the results to those of a nonsmoking person. The results can be given to you as your lung age. Finding out your lung age right after having the test done may help you to stop smoking.    Your doctor can also discuss with you all of your options and refer you to smoking-cessation support groups. You may wish to use nicotine replacement (gum, patches, inhaler) or one of  the prescription medications that have been shown to increase quit rates and prolong abstinence from smoking. But whatever you and your doctor decide on these matters, it will still be you who decides when an how to quit. Here are some techniques:    Switch Brands      Switch to a brand you find distasteful.       Change to a brand that is low in tar and nicotine a couple of weeks before your target quit date. This will help change your smoking behavior. However,  do not  smoke more cigarettes, inhale them more often or more deeply, or place your fingertips over the holes in the filters. All of these actions will increase your nicotine intake, and the idea is to get your body used to functioning without nicotine.      Cut Down the Number of Cigarettes You Smoke      Smoke only half of each cigarette.       Each day, postpone the lighting of your first cigarette by one hour.       Decide you'll only smoke during odd or even hours of  the day.       Decide beforehand how many cigarettes you'll smoke during the day. For each additional cigarette, give a dollar to your favorite charity.       Change your eating habits to help you cut down. For example, drink milk, which many people consider incompatible with smoking. End meals or snacks with something that won't lead to a cigarette.       Reach for a glass of juice instead of a cigarette for a "pick-me-up."       Remember: Cutting down can help you quit, but it's not a substitute for quitting. If you're down to about seven cigarettes a day, it's time to set your target quit date, and get ready to stick to it.      Don't Smoke "Automatically"      Smoke only those cigarettes you  really  want. Catch yourself before you light up a cigarette out of pure habit.       Don't empty your ashtrays. This will remind you of how many cigarettes you've smoked each day, and the sight and the smell of stale cigarettes butts will be very unpleasant.       Make yourself aware of each cigarette by using the opposite hand or putting cigarettes in an unfamiliar location or a different pocket to break the automatic reach.       If you light up many times during the day without even thinking about it, try to look in a mirror each time you put a match to your cigarette. You may decide you don't need it.      Make Smoking Inconvenient      Stop buying cigarettes by the carton. Wait until one pack is empty before you buy another.       Stop carrying cigarettes with you at home or at work. Make them difficult to get to.      Make Smoking Unpleasant      Smoke only under circumstances that aren't especially pleasurable for you. If you like to smoke with others, smoke alone. Turn your chair to an empty corner and focus only on the cigarette you are smoking and all its many negative effects.       Collect all your cigarette butts in one large glass container as a visual reminder of the  filth made by smoking.       Just Before Quitting      Practice going without cigarettes.       Don't think of  never  smoking again. Think of quitting in terms of  one day at a time  .       Tell yourself you won't smoke today, and then don't.       Clean your clothes to rid them of the cigarette smell, which can linger a long time.      On the Day You Quit      Throw away all your cigarettes and matches. Hide Engineer, mining.       Visit the dentist and have your teeth cleaned to get rid of tobacco stains. Notice how nice they look and resolve to keep them that way.       Make a list of things you'd like to buy for yourself or someone else. Estimate the cost in terms of packs of cigarettes, and put the money aside to buy these presents.       Keep very busy on the big day. Go to the movies, exercise, take long walks, or go bike riding.       Remind your family and friends that this is your quit date, and ask them to help you over the rough spots of the first couple of days and weeks.       Buy yourself a treat or do something special to celebrate.      Telephone and Internet Support     Telephone, web-, and computer-based programs can offer you the support that you need to quit and to stay smoke-free. You can find many programs online, like the American Lung Association's Freedom from Smoking .    Immediately After Quitting      Develop a clean, fresh, nonsmoking environment around yourselfat work and at home. Buy yourself flowersyou may be surprised how much you can enjoy their scent now.       The first few days after you quit, spend as much free time as possible in places where smoking isn't allowed, such as libraries, museums, theaters, department stores, and churches.       Drink large quantities of water and fruit juice (but avoid sodas that contain caffeine).       Try to avoid alcohol, coffee, and other beverages that you associate with cigarette smoking.       Strike up conversation instead of a Microbiologist  for a cigarette.       If you miss the sensation of having a cigarette in your hand, play with something elsea pencil, a paper clip, a marble.      If you miss having something in your mouth, try toothpicks or a fake cigarette.  Your information:  Name: Rachael Mays  DOB: March 10, 1956    Your instructions:        What to do after you leave the hospital:    Recommended diet:general    Recommended activity: astolerated    If you experience any of the following symptoms chest discomfort difficulty breathing, dizziness, unusual weakness nausea please follow up with DR SLOUGH    Follow-up with DR SLOUGH  1 week   [phone]    The following personal items were collected during your admission and were returned to you:    Valuables  Dentures: None  Vision - Corrective Lenses: Glasses  Hearing Aid: None  Jewelry: Ring;Bracelet  Body Piercings Removed: Yes  Clothing: Footwear;Pants;Shirt;Undergarments (Comment)  Were All Patient Medications Collected?: Not Applicable  Other Valuables: Cell phone  Valuables Given To: Patient  Provide Name(s) of Who Valuable(s) Were Given To: none  Responsible person(s) in the waiting room?: none    Information obtained by:   By signing below, I understand that if any problems occur once I leave the hospital I am to contact MY DOCTOR OR GO TO THE EMERGENCY ROOM I understand and acknowledge receipt of the instructions indicated above.

## 2011-05-15 NOTE — Progress Notes (Signed)
Stress test complete. Patient tolerated well. Darielys Giglia C Dale Ribeiro,RN

## 2011-05-15 NOTE — Progress Notes (Signed)
To stress lab by bed with tele

## 2011-05-15 NOTE — H&P (Signed)
1) CP-stress test today, if neg home later today    [id number]

## 2011-05-15 NOTE — ED Provider Notes (Signed)
CHIEF COMPLAINT  Chief Complaint   Patient presents with   ??? Chest Pain     c/o having an "attack" this morning @ 1000 "like someone was sticking knives in my back" then returned again tonight "right where my bra goes in the back"; feels "drained"; also c/o chest pain     HPI  Rachael Mays is a 55 y.o. female who presents Complaining of back and chest pain. At about 10:30 this morning she had an episode where she developed pain across her borderline in her back that lasted about 45 minutes it was a sharp intense pain that was worse with inspiration and made her feel short of breath. He was intense for a couple of hours but gradually eased up and never went away completely. At about 4:30 the pain started getting worse again. At one point she did get diaphoretic with it. On her way to the hospital she developed some tightness in the anterior chest that radiated down both arms to the elbows. That started about 15 minutes prior to arrival and has almost gone away on its own. Today she has also had some nausea and has had watery diarrhea about 5 times and brown in color no vomiting. The pain in her back feels similar to when she had a pulmonary embolism about 22 years ago when she was on birth control pills. She eventually ended up getting an IVC filter and also developed a DVT in 2003 after having some neck surgery. She had a normal imaging stress test in 2010 minutes not any other cardiac workup since then. She denies any cough. Denies any recent leg swelling or pain.  REVIEW OF SYSTEMS   constituitional- no fever, , GI- no abdominal pain, vomiting, diarrhea GU- no dysuria, Skin- no rash  ,remainder of Review of systems reviewed and otherwise negative.   PAST MEDICAL HISTORY  Past Medical History   Diagnosis Date   ??? Chronic back pain    ??? Deep vein thrombosis    ??? Depression    ??? GERD (gastroesophageal reflux disease)      NO LONGER SINCE LAP   ??? Hyperlipidemia    ??? Pulmonary embolism    ??? Obesity      hx of; had  lap band   ??? Hypertension      no problems since lap SURGERY     FAMILY HISTORY  Family History   Problem Relation Age of Onset   ??? Diabetes Other    ??? High Blood Pressure Other    ??? Obesity Other    ??? Heart Disease Mother    ??? Cancer Father      colon     SOCIAL HISTORY  History     Social History   ??? Marital Status: Married     Spouse Name: N/A     Number of Children: N/A   ??? Years of Education: N/A     Social History Main Topics   ??? Smoking status: Current Everyday Smoker -- 0.5 packs/day for 35 years     Types: Cigarettes   ??? Smokeless tobacco: Never Used    Comment: started smoking again   ??? Alcohol Use: No   ??? Drug Use: No   ??? Sexually Active: None     Other Topics Concern   ??? None     Social History Narrative   ??? None     SURGICAL HISTORY  Past Surgical History   Procedure Date   ??? Cesarean  section    ??? Cholecystectomy    ??? Hysterectomy    ??? Shoulder surgery    ??? Lap band 05/01/08     Dr. Suzi Roots   ??? Back surgery      neck  plates   ??? Colonoscopy 04/08/10     CURRENT MEDICATIONS  Current Outpatient Rx   Name Route Sig Dispense Refill   ??? CALCIUM CARBONATE 600 MG PO TABS Oral Take 1 tablet by mouth 2 times daily.       ??? VITAMIN D 1000 UNITS PO CAPS Oral Take 1,000 Units by mouth 2 times daily.       ??? CELEXA PO Oral Take 1 tablet by mouth nightly.       ??? MULTIVITAMIN PO Oral Take 1 tablet by mouth 2 times daily.     ??? ALBUTEROL 90 MCG/ACT IN AERS Inhalation Inhale 2 puffs into the lungs every 6 hours as needed.       ALLERGIES  Allergies   Allergen Reactions   ??? Talwin Rash     IMMUNIZATIONS; Noncontributory  PHYSICAL EXAM  VITAL SIGNS: BP 191/84   Pulse 67   Temp(Src) 98.1 ??F (36.7 ??C) (Oral)   Resp 18   Ht 5\' 6"  (1.676 m)   Wt 230 lb (104.327 kg)   BMI 37.12 kg/m2   SpO2 96%  Constitutional: Alert 55 y.o. female who does not appear toxic or acutely ill.  HENT: Normocephalic, Atraumatic, Bilateral external ears normal, Oropharynx moist, Nose normal.   Eyes: PERRLA, EOMI, Conjunctiva normal, No discharge.    Neck: Normal range of motion, no JVD or meningismus.   Cardiovascular: Normal heart rate, Normal rhythm, No murmurs, No rubs, No gallops.   Thorax & Lungs: Normal breath sounds, No respiratory distress, No wheezing, No chest tenderness.   Abdomen: Soft, No tenderness, No masses, No pulsatile masses.   Skin: Warm, Dry, No erythema, No rash.   Back: No tenderness, No CVA tenderness.   Genitalia: Deferred  Rectal: Deferred.   Extremities: Intact distal pulses, No edema, No tenderness, No cyanosis, No clubbing.   Musculoskeletal: Good range of motion in all major joints. No tenderness to palpation or major deformities noted.   Neurologic: Alert, Normal motor function, Normal sensory function, No focal deficits noted. Speech normal  Psychiatric: Affect normal, Mood normal.   DIAGNOSTIC RESULTS ed labs   Labs Reviewed   POCT CHEM BASIC W ICA - Abnormal; Notable for the following:     POC Ionized Calcium 1.05 (*)     All other components within normal limits    Narrative:     Decreased pH due to localized lactic acid production may cause increased ionized calcium. Increased pH due to loss of CO2 from the sample may cause decreased ionized calcium.   CBC WITH AUTO DIFFERENTIAL   PROTIME-INR   APTT   POCT TROPONIN         EKG read by me in the absence of a cardiologist shows: Sinus rhythm of 70, normal PR interval, QT interval 466 ms, axis -5??, no evidence of acute ischemia or injury pattern  RADIOLOGY Read by radiology and directly visualized by me: CT scan of the chest shows no evidence of aortic aneurysm aortic dissection or pulmonary artery embolism some atelectasis was present  ED COURSE: After 3 sublingual nitroglycerin she is pain-free and remained pain-free she was also given some nitro paste  PROCEDURES: None  CRITICAL CARE: None  CONSULTATIONS: I discussed the case with Dr. Ellwood Handler  who is giving admitting orders  MEDICAL DECISION MAKING: Pleuritic back pain, with her prior history of pulmonary embolism I felt we  needed to rule this out and i beliave the CT scan has adequately Done that, She has also had some anterior chest pain with shortness of breath and diaphoresis which is concerning for acute coronary syndrome she would seem to be "at low risk with her normal MP I study in November of 2010 but she does have risk factors of tobacco use, hyperlipidemia and age and I feel should she needs to be ruled out for this, At this time it does not appear that she has aortic dissection, pulmonary embolism, myocarditis, pericarditis, pneumothorax  IMPRESSION Chest pain rule out acute coronary syndrome  PLAN Admission    Please note that this chart was generated using Dragon dictation software. Although every effort was made to ensure the accuracy of this automated transcription, some errors in transcription may have occurred    Cyntha Brickman A Jamia Hoban  05/15/2011  12:15 AM        Shari Prows, MD  05/15/11 [phone]

## 2011-05-15 NOTE — Progress Notes (Signed)
MYOVIEW GXT   05/15/2011    Rachael Mays 55 y.o.     Patient walked on Treadmill for 10"35 minutes using bruce protocol.     There was no chest discomfort or ischemia.    <1 mm ST depression    Findings:  Normal test    Myoview pending

## 2011-05-18 MED FILL — SODIUM CHLORIDE 0.9 % IV SOLN: 0.9 % | INTRAVENOUS | Qty: 500

## 2011-11-03 LAB — POC URINE WITH MICROSCOPIC
Bilirubin Urine: 0 mg/dL
Blood, Urine: NEGATIVE
Glucose, Ur: NEGATIVE
Ketones, Urine: NEGATIVE
Leukocytes, UA: NEGATIVE
Nitrite, Urine: NEGATIVE
Protein, UA: NEGATIVE
Specific Gravity, UA: 1.03 (ref 1.005–1.030)
Urobilinogen, Urine: NORMAL
pH, UA: 6 (ref 4.5–8.0)

## 2011-11-03 MED ORDER — FLUCONAZOLE 150 MG PO TABS
150 MG | ORAL_TABLET | ORAL | Status: AC
Start: 2011-11-03 — End: 2011-11-25

## 2011-11-03 NOTE — Progress Notes (Signed)
Pt here today for ongoing rash located under breasts & groin. Itching cycles. Also pt c/o low back pain just the last 2 days and states her urine is a bright yellow in color.

## 2011-11-03 NOTE — Progress Notes (Signed)
Subjective:      Patient ID: Rachael Mays is a 55 y.o. female.    HPI    Presents with multiple complaints.  Made appt for rash under bilateral breasts and in groin.  Rx for nystatin caused burning and itching so this was only used x 1 day.  Uses Gold Bond powder and this seems to help but rash not entirely resolved.    Low back pain x 2-3 days.  Works at [employer] and [employer].  Think this is cause of sx but has noticed that urine is bright yellow.    Review of Systems    Objective:   Physical Exam   Constitutional: She appears well-developed and well-nourished.   Cardiovascular: Normal rate, regular rhythm and normal heart sounds.    Pulmonary/Chest: Effort normal and breath sounds normal. She has no wheezes. She has no rales.   Abdominal: There is no CVA tenderness.   Musculoskeletal:        Thoracic back: She exhibits no bony tenderness, no pain and no spasm.        Lumbar back: She exhibits no bony tenderness, no pain and no spasm.   Skin:        Skin under bilateral breasts with areas approx 5cm x 7cm of erythema; erythema bilateral groin under panus   Psychiatric: She has a normal mood and affect. Her behavior is normal.       Assessment:     1. Intertrigo  fluconazole (DIFLUCAN) 150 MG tablet   2. Urine abnormality  POC URINE with Microscopic   3. Back pain         Plan:   Continue Gold Bond for intertrigo and add diflucan once weekly x 4 weeks.  Check LFTs.  U/a normal but specific gravity 1.030.  Increase po fluids.  Back pain likely musculoskeletal.  Call if no improvement with heat, stretching.

## 2011-11-07 ENCOUNTER — Inpatient Hospital Stay
Admit: 2011-11-07 | Discharge: 2011-11-08 | Disposition: A | Discharge status: Home / Self Care | Attending: Emergency Medicine

## 2011-11-07 MED ADMIN — Tdap (ADACEL) injection 0.5 mL: INTRAMUSCULAR | NDC 49281040010

## 2011-11-07 MED FILL — LIDOCAINE HCL (PF) 1 % IJ SOLN: 1 % | INTRAMUSCULAR | Qty: 30

## 2011-11-07 MED FILL — ADACEL 5-2-15.5 LF-MCG/0.5 IM SUSP: INTRAMUSCULAR | Qty: 0.5

## 2011-11-07 NOTE — Discharge Instructions (Signed)
Suture removal in 7 days    Laceration Care  A laceration is a cut or lesion that goes through all layers of the skin and into the tissue just beneath the skin.    BEFORE THE PROCEDURE  The laceration will be inspected by your caregiver for amount and extent of tissue damage, for bleeding, for evidence of foreign bodies (pieces of dirt, glass, etc.), and for cleanliness. Pain medications can be used if necessary. The wound will then be cleansed to help prevent infection. Sutures, staples or skin adhesive strips will be used to close the wound, stop bleeding and speed healing. Sometimes this will decrease the likelihood of infection and bleeding.  LET YOUR CAREGIVERS KNOW ABOUT THE FOLLOWING:   Allergies.   Medications taken including herbs, eye drops, over the counter medications, and creams.    Use of steroids (by mouth or creams).    Previous problems with anesthetics or novocaine.   Possibility of pregnancy, if this applies.   History of blood clots (thrombophlebitis).    History of bleeding or blood problems.    Previous surgery.    Other health problems.    RISKS AND COMPLICATIONS  Most lacerations heal fully. The healing time required varies depending on location. Complications of a laceration can include pain, bleeding, infection, dehiscence (splitting open or separation of the wound edges) and scar formation. The likelihood of complication depends on wound complexity, location, and on how the laceration occurred.  HOME CARE INSTRUCTIONS   If you were given a dressing, you should change it at least once a day, or as instructed by your caregiver. If the bandage sticks, soak it off with water.    Twice a day, wash the area with soap and water and rinse with plain water to remove all soap. Pat (do not rub) dry with a clean towel. Look for signs of infection (see below).    Re-apply prescribed creams or ointments as instructed. This will help prevent infection. This also helps keep the bandage from  sticking.    If the bandage becomes wet, dirty, or develops a foul smell, change it as soon as possible.    Only take over-the-counter or prescription medicines for pain, discomfort, or fever as directed by your doctor.    Have your sutures, staples or skin adhesive strips removed as instructed. Skin adhesive strips will peel off from the outer edges toward the center and will eventually fall off. Report to your caregiver if the strips are falling off and the wound does not appear fully healed.   SEEK MEDICAL CARE IF:   There is redness, swelling, or increasing pain in the wound.    There is a red line that goes up your arm or leg.    Pus is coming from wound.    You develop an unexplained oral temperature above 102 F (38.9 C), or as your caregiver suggests.    You notice a foul smell coming from the wound or dressing.    There is a breaking open of the wound (edges not staying together) before or after sutures have been removed.    You notice something coming out of the wound such as wood or glass.    The wound is on your hand or foot and you find that you are unable to properly move a finger or toe.    There is severe swelling around the wound causing pain and numbness or a change in color in your arm, hand, leg, or foot.  SEEK IMMEDIATE MEDICAL CARE IF:   Pain is not controlled with prescribed medication or with acetaminophen or ibuprofen.    There is severe swelling around the wound site.    The wound splits open and bleeding recurs.    You experience worsening numbness, weakness, or loss of function of any joint around or beyond the wound.    You develop painful lumps near the wound or on the skin anywhere on your body.   If you did not receive a tetanus shot today because you did not recall when your last one was given, make sure to check with your caregiver when you have your sutures removed to determine if you need one.  MAKE SURE YOU:    Understand these instructions.    Will watch  your condition.    Will get help right away if you are not doing well or get worse.   Document Released: 11/23/2005 Document Re-Released: 02/17/2010  [facility] Patient Information 2012 [place].

## 2011-11-07 NOTE — ED Provider Notes (Signed)
HPI  Chief Complaint:    Chief Complaint   Patient presents with   ??? Hand Injury     WORK INJURY 430 PM. PT CUT LEFT THUMB ON SERRATED KNIFE THAT SHE WAS USING TO SLICE BREAD       Nursing Notes, Past Medical Hx, Past Surgical Hx, Social Hx, Allergies, and Family Hx were all reviewed and agreed with or any disagreements were addressed in the HPI.    HPI:    This is a  55 y.o. female that presents to the emergency room with a left thumb laceration.  She states that she accidentally cut her thumb at approximately 1630 with a bread knife at work.  She currently rates the pain at 2 of 10.  She denies any numbness, tingling or weakness.  Her tetanus is not up-to-date.      Review of Systems   Skin: Positive for wound.   All other systems reviewed and are negative.          Physical Exam   Nursing note and vitals reviewed.  Constitutional: She is oriented to person, place, and time. She appears well-developed and well-nourished.   HENT:   Head: Normocephalic and atraumatic.   Neck: Normal range of motion. Neck supple.   Pulmonary/Chest: Effort normal. No respiratory distress.   Abdominal: She exhibits no distension.   Musculoskeletal:        Left hand: She exhibits tenderness and laceration. She exhibits normal range of motion, no bony tenderness, normal two-point discrimination, normal capillary refill, no deformity and no swelling. normal sensation noted. Normal strength noted.        Hands:  Neurological: She is alert and oriented to person, place, and time.   Skin: Skin is warm and dry. No rash noted. She is not diaphoretic. No erythema. No pallor.   Psychiatric: She has a normal mood and affect. Her behavior is normal. Judgment and thought content normal.         Lac Repair  Date/Time: 11/07/2011 7:03 PM  Performed by: Ivor Reining R  Authorized by: Sabino Niemann T  Consent: Verbal consent obtained.  Risks and benefits: risks, benefits and alternatives were discussed  Consent given by: patient  Patient identity  confirmed: verbally with patient  Body area: upper extremity  Location details: left thumb  Laceration length: 1 cm  Foreign bodies: no foreign bodies  Tendon involvement: none  Nerve involvement: none  Vascular damage: no  Anesthesia: local infiltration  Local anesthetic: lidocaine 1% without epinephrine  Anesthetic total: 1.5 ml  Patient sedated: no  Preparation: Patient was prepped and draped in the usual sterile fashion.  Irrigation solution: saline  Irrigation method: jet lavage  Amount of cleaning: standard  Debridement: none  Degree of undermining: none  Skin closure: 5-0 nylon  Number of sutures: 4  Technique: simple  Approximation: close  Approximation difficulty: simple  Dressing: antibiotic ointment and tube gauze  Patient tolerance: Patient tolerated the procedure well with no immediate complications.          MDM    MEDICAL DECISION MAKING    Vitals:    Filed Vitals:    11/07/11 1757   BP: 154/80   Pulse: 72   Temp: 98.2 ??F (36.8 ??C)   Resp: 20   Height: 5\' 6"  (1.676 m)   Weight: 220 lb (99.791 kg)   SpO2: 99%       LABS: Labs Reviewed - No data to display     Remainder of  labs reviewed and were negative at this time or not returned at the time of this note.        EKG: N/A    X-RAYS: The attending, Bo Merino, MD, and I have reviewed radiologic plain film image(s).  The plain films will be read or overread by the radiologist.  ALL OTHER NON-PLAIN FILM IMAGES SUCH AS CT, ULTRASOUND AND MRI HAVE BEEN READ BY THE RADIOLOGIST.        No results found.     PROCEDURES: N/A    CRITICAL CARE:  As a Corporate treasurer I am not authorized to provide.  Please see the attending note for any critical care.    CONSULTATIONS: N/A    MEDICAL DECISION MAKING / ED COURSE:    The patient's tetanus was updated.  She was educated on laceration care and signs and symptoms of infection.  She was advised on suture removal in 7 days.  She was instructed to followup with her doctor in 3 days for wound recheck.  She was  instructed to return to the emergency room for new or worse symptoms, or any signs for infection    The patient tolerated their visit well.  They were seen and evaluated by the attending physician who agreed with the assessment and plan.  The patient and / or the family were informed of the results of any tests, a time was given to answer questions, a plan was proposed and they agreed with plan.      CLINICAL IMPRESSION:  1. Thumb laceration        PATIENT REFERRED TO:    Lum Keas, MD  [address] [place] [address]  [phone]          DISCHARGE MEDICATIONS:  New Prescriptions    No medications on file       (Please note the MDM and HPI sections of this note were completed with a voice recognition program.  Efforts were made to edit the dictations but occasionally words are mis-transcribed.)    Garald Balding, CNP  11/07/11 1942

## 2011-11-14 ENCOUNTER — Inpatient Hospital Stay
Admit: 2011-11-14 | Discharge: 2011-11-14 | Disposition: A | Discharge status: Home / Self Care | Attending: Emergency Medicine

## 2011-11-14 MED ORDER — SULFAMETHOXAZOLE-TRIMETHOPRIM 800-160 MG PO TABS
800-160 MG | ORAL_TABLET | Freq: Two times a day (BID) | ORAL | Status: AC
Start: 2011-11-14 — End: 2011-11-24

## 2011-11-14 MED ORDER — CEPHALEXIN 500 MG PO CAPS
500 MG | ORAL_CAPSULE | Freq: Four times a day (QID) | ORAL | Status: AC
Start: 2011-11-14 — End: 2011-11-24

## 2011-11-14 MED FILL — DOUBLE ANTIBIOTIC 500-10000 UNIT/GM EX OINT: 500-10000 UNIT/GM | CUTANEOUS | Qty: 1

## 2011-11-14 NOTE — Discharge Instructions (Signed)
Wound Infection  A wound infection has happened because germs (bacteria) have started growing in the wound. All wounds have some bacteria in them. An infection can occur when the skin has been damaged either by accident or surgically. A surgical example of this would be when your caregiver cuts your skin to remove a mole. If stitches need to be removed, and a wound infection develops, the stitches may have to be removed early. This means that the entire wound may break apart. This may look terrible to you but it is not dangerous. It will heal from the inside to the outside.  Wound infection is more common and can be more complicated for diabetics and for those patients with weakened immune systems (such as for those receiving treatment for cancer).  HOME CARE INSTRUCTIONS   Rinse your wound with warm, soapy water four times per day, or as directed by your caregiver.    When through washing, gently pat the wound dry with a soft fabric. As the wound is healing, millions of tiny new blood vessels are forming. Do not rub the wound. This may cause bleeding. Apply dressing to wound as directed.    Only take over-the-counter or prescription medicines for pain, discomfort, or fever as directed by your caregiver.    Do not soak wound, as in bathing or swimming, until it is healed or as directed by your caregiver. Avoid work out exercises that make you sweat heavily.    During healing the wound may itch. You may use over the counter antihistamines in a dosage as recommended on package. If this medication makes you sleepy then take it as a dose at bedtime.    If your caregiver has prescribed a medication that kills germs (antibiotics), take your medication as directed. Take it for the entire length of time it was prescribed, even if the wound appears to be doing well.   SEEK IMMEDIATE MEDICAL CARE IF:   There is increased swelling or increased pain near the wound.    There is increasing redness (inflammation) around  the wound.    There is an increase in the amount of pus coming from the wound.    More of the wound breaks open if only a small number of the stitches were removed.    You or your child has an oral temperature above 102 F (38.9 C), not controlled by medicine.    Your baby is older than 3 months with a rectal temperature of 102 F (38.9 C) or higher.    Your baby is 40 months old or younger with a rectal temperature of 100.4 F (38 C) or higher.   Return to your caregiver's office in seven days or as directed to have your wound rechecked.  Document Released: 09/20/2009   [facility] Patient Information 2012 [place].

## 2011-11-14 NOTE — ED Provider Notes (Signed)
I have personally performed and/or participated in the history, exam and medical decision making and agree with all pertinent clinical information.  I have also reviewed and agree with the past medical, family and social history unless otherwise noted.      Bo Merino, MD  11/14/11 [phone]

## 2011-11-14 NOTE — ED Provider Notes (Signed)
HPI  Chief Complaint:    Chief Complaint   Patient presents with   . Wound Infection     Pt. states she had stiches put in here last saturday, no wound looks infected and painful.       Nursing Notes, Past Medical Hx, Past Surgical Hx, Social Hx, Allergies, and Family Hx were all reviewed and agreed with or any disagreements were addressed in the HPI.    HPI:    This is a  55 y.o. female presents to the emergency department complaining of sutures that are infected. She states that she had stitches that were placed in her thumb this was one week ago. She states that since then she has had no issues until 3 days ago when she developed drainage. Denies any red streaking she does have increasing pain over these areas. She states she can move her left thumb with minimal difficulty.    Past Medical/Surgical History:      Diagnosis Date   . Chronic back pain    . Deep vein thrombosis    . Depression    . GERD (gastroesophageal reflux disease)      NO LONGER SINCE LAP   . Hyperlipidemia    . Pulmonary embolism    . Obesity      hx of; had lap band   . Hypertension      no problems since lap SURGERY         Procedure Date   . Cesarean section    . Cholecystectomy    . Hysterectomy    . Shoulder surgery    . Lap band 05/01/08     Dr. Suzi Roots   . Back surgery      neck  plates   . Colonoscopy 04/08/10       Medications:  Previous Medications    ALBUTEROL (PROVENTIL;VENTOLIN) 90 MCG/ACT INHALER    Inhale 2 puffs into the lungs every 6 hours as needed.    CALCIUM CARBONATE 600 MG TABS TABLET    Take 1 tablet by mouth 2 times daily.      CITALOPRAM (CELEXA) 20 MG TABLET    Take 10 mg by mouth nightly.      FLUCONAZOLE (DIFLUCAN) 150 MG TABLET    Take 1 tablet by mouth once a week for 4 doses.    MULTIPLE VITAMIN (MULTIVITAMIN PO)    Take 1 tablet by mouth 2 times daily.    VITAMIN D (CHOLECALCIFEROL) 1000 UNITS CAPS CAPSULE    Take 1,000 Units by mouth 2 times daily.           Review of Systems    Negative for weakness of tingling  headaches neck pain vomiting back pain remainder of review systems reviewed and is negative  Physical Exam   Nursing note and vitals reviewed.  Constitutional: She is oriented to person, place, and time. She appears well-developed and well-nourished. No distress.   HENT:   Head: Normocephalic and atraumatic.   Right Ear: External ear normal.   Left Ear: External ear normal.   Nose: Nose normal.   Eyes: Right eye exhibits no discharge. Left eye exhibits no discharge.   Neck: Normal range of motion. Neck supple.   Pulmonary/Chest: Effort normal.   Musculoskeletal: Normal range of motion.        Over her left thumb she does have what appears to be 4 sutures that are in place. There is some drainage and some erythema at the base of the  nail. The nailbed is intact there is no lymphangitic streaking she does have good flexion and extension of the IP joint.   Neurological: She is alert and oriented to person, place, and time.   Skin: Skin is warm and dry. She is not diaphoretic.   Psychiatric: She has a normal mood and affect. Her behavior is normal.         Procedures      MDM      MEDICAL DECISION MAKING    Vitals:    Filed Vitals:    11/14/11 1151   BP: 136/73   Pulse: 68   Temp: 98.5 F (36.9 C)   TempSrc: Oral   Resp: 16   Height: 5\' 6"  (1.676 m)   Weight: 220 lb (99.791 kg)   SpO2: 95%       LABS: Labs Reviewed - No data to display     Remainder of labs reviewed and were negative at this time or not returned at the time of this note.    Orders:  Orders Placed This Encounter   Procedures   . Nursing communication       EKG: N/A    X-RAYS: The attending, Bo Merino, MD, and I have reviewed radiologic plain film image(s).  The plain films will be read or overread by the radiologist.  ALL OTHER NON-PLAIN FILM IMAGES SUCH AS CT, ULTRASOUND AND MRI HAVE BEEN READ BY THE RADIOLOGIST.        No results found.     PROCEDURES: Sutures are removed without difficulty  CRITICAL CARE:  As a Mid-Level Provider I am not  authorized to provide.  Please see the attending note for any critical care.    CONSULTATIONS: N/A    MEDICAL DECISION MAKING / ED COURSE:      This patient does have some draining areas. This appears to be basically draining paronychia from a wound infection from sutures. There is no lymphangitic streaking she does have good range of motion movement of the thumb. She will be placed on antibiotic she'll followup with her physician 2 days return for any worsening symptoms.    The patient tolerated their visit well.  They were seen and evaluated by the attending physician who agreed with the assessment and plan.  The patient and / or the family were informed of the results of any tests, a time was given to answer questions, a plan was proposed and they agreed with plan.      CLINICAL IMPRESSION:  1. Wound infection    2. Encounter for removal of sutures        PATIENT REFERRED TO:  Lum Keas, MD  [address] [place] [address]  [phone]          DISCHARGE MEDICATIONS:  New Prescriptions    CEPHALEXIN (KEFLEX) 500 MG CAPSULE    Take 1 capsule by mouth 4 times daily for 10 days.    SULFAMETHOXAZOLE-TRIMETHOPRIM (BACTRIM DS) 800-160 MG PER TABLET    Take 1 tablet by mouth 2 times daily for 10 days.       (Please note the MDM and HPI sections of this note were completed with a voice recognition program.  Efforts were made to edit the dictations but occasionally words are mis-transcribed.)      Marrion Coy, PA  11/14/11 1438

## 2011-11-14 NOTE — ED Notes (Signed)
Pt is alert oriented time three. 4 Stitches to her left thumb noted. Around the wound it is red and swallow and is draining whitish fluid from the wound. Call light in reach.     Merri Brunette, RN  11/14/11 1209

## 2011-11-25 MED ORDER — HYDROCHLOROTHIAZIDE 25 MG PO TABS
25 MG | ORAL_TABLET | Freq: Every day | ORAL | Status: DC
Start: 2011-11-25 — End: 2012-02-04

## 2011-12-16 MED ORDER — FLUOCINONIDE 0.05 % EX CREA
0.05 % | CUTANEOUS | Status: DC
Start: 2011-12-16 — End: 2011-12-23

## 2011-12-16 NOTE — Telephone Encounter (Signed)
Requesting Fluocinodine .5% to be refilled

## 2011-12-23 MED ORDER — AMOXICILLIN 250 MG PO CHEW
250 MG | ORAL_TABLET | Freq: Three times a day (TID) | ORAL | Status: AC
Start: 2011-12-23 — End: 2012-01-02

## 2011-12-23 NOTE — Progress Notes (Signed)
Subjective:      Patient ID: Rachael Mays is a 56 y.o. female.    URI   The current episode started in the past 7 days. The problem has been unchanged. There has been no fever. Associated symptoms include congestion, coughing and headaches. Pertinent negatives include no ear pain. She has tried decongestant for the symptoms.       Review of Systems   HENT: Positive for congestion. Negative for ear pain.    Respiratory: Positive for cough.    Neurological: Positive for headaches.     Allergies   Allergen Reactions   . Nystatin Other (See Comments)     "welts"       . Talwin Rash       Objective:   Physical Exam   Constitutional: She appears well-developed and well-nourished. She is cooperative. No distress.   HENT:   Right Ear: Tympanic membrane and ear canal normal.   Left Ear: Tympanic membrane and ear canal normal.   Nose: Mucosal edema and rhinorrhea (purulent) present. Right sinus exhibits frontal sinus tenderness. Right sinus exhibits no maxillary sinus tenderness. Left sinus exhibits frontal sinus tenderness. Left sinus exhibits no maxillary sinus tenderness.   Mouth/Throat: Oropharynx is clear and moist and mucous membranes are normal.   Pulmonary/Chest: Effort normal and breath sounds normal.   Lymphadenopathy:     She has no cervical adenopathy.   Neurological: She is alert.       Assessment:      Rachael Mays was seen today for uri.    Diagnoses and associated orders for this visit:    Sinusitis    Other Orders  - amoxicillin (AMOXIL) 250 MG chewable tablet; Take 2 tablets by mouth 3 times daily for 10 days.               Plan:

## 2011-12-25 MED ORDER — PROAIR HFA 108 (90 BASE) MCG/ACT IN AERS
108 (90 Base) MCG/ACT | RESPIRATORY_TRACT | Status: DC
Start: 2011-12-25 — End: 2012-03-16

## 2012-02-04 MED ORDER — HYDROCHLOROTHIAZIDE 25 MG PO TABS
25 MG | ORAL_TABLET | Freq: Every day | ORAL | Status: DC
Start: 2012-02-04 — End: 2012-04-14

## 2012-02-08 MED ORDER — CITALOPRAM HYDROBROMIDE 20 MG PO TABS
20 MG | ORAL_TABLET | ORAL | Status: DC
Start: 2012-02-08 — End: 2012-03-16

## 2012-03-04 MED ORDER — ALPRAZOLAM 0.5 MG PO TABS
0.5 MG | ORAL_TABLET | Freq: Three times a day (TID) | ORAL | Status: DC | PRN
Start: 2012-03-04 — End: 2012-09-20

## 2012-03-16 MED ORDER — NYSTATIN 100000 UNIT/GM EX POWD
100000 UNIT/GM | CUTANEOUS | Status: DC
Start: 2012-03-16 — End: 2012-06-24

## 2012-03-16 MED ORDER — FLUCONAZOLE 100 MG PO TABS
100 MG | ORAL_TABLET | ORAL | Status: AC
Start: 2012-03-16 — End: 2012-03-30

## 2012-03-16 MED ORDER — ESCITALOPRAM OXALATE 20 MG PO TABS
20 MG | ORAL_TABLET | Freq: Every day | ORAL | Status: DC
Start: 2012-03-16 — End: 2012-03-18

## 2012-03-16 NOTE — Progress Notes (Signed)
Subjective:      Patient ID: Rachael Mays is a 56 y.o. female.    HPI  Patient is here for a rash on her lower abdomen and groin area.  She has seen Dr Mack Hook for the rash and has been treated but the rash never goes completely away.    Still having CP, has had it everyday since in hospital in June, workup then neg.  When takes xanax helps.  Describes as a tightness in chest.  States doesn't feel citalopram working for anxiety.  Thinks lexapro worked better.  Happens everyday, some days worse than others.          Review of Systems    Objective:   Physical Exam  Filed Vitals:    03/16/12 1401   BP: 140/80   Weight: 214 lb (97.07 kg)   Alert and oriented x 4 NAD, well nourished, well hydrated, well developed.  Lung clear  CV rrr  Under panus with erythematous moist rash with well defined borders, also into groing bilaterally    Assessment:     Rachael Mays was seen today for rash.    Diagnoses and associated orders for this visit:    Anxiety  Sounds like anxiety given daily for a year and neg workup last summer, change back to lexapro as felt worked better in past, f/u with TS in a month and see how doing.    Intertrigo  Trial oral meds again, once calmed down then try powder, recheck labs r/o DM, if cont to have may need to try to PA surgery to get rid of pannus    Chest pain  Does not sound cardiac, more anxiety, as above    Other Orders  - fluconazole (DIFLUCAN) 100 MG tablet; Take 2 day one then 1 daily x 2 weeks  - nystatin (MYCOSTATIN) powder; Apply 3 times daily.  - escitalopram (LEXAPRO) 20 MG tablet; Take 1 tablet by mouth daily.

## 2012-03-17 LAB — CBC
Hematocrit: 43.9 % (ref 36.0–48.0)
Hemoglobin: 14.5 gm/dl (ref 12.0–16.0)
MCH: 28.7 pg (ref 26–34)
MCHC: 33 gm/dl (ref 31–36)
MCV: 86.9 fl (ref 80–100)
MPV: 9.9 fl (ref 5.0–10.5)
Platelets: 208 10*3 (ref 135–450)
RBC: 5.05 10*6 (ref 4.0–5.2)
RDW: 14.3 % (ref 11.5–14.5)
WBC: 8.5 10*3 (ref 4.0–11.0)

## 2012-03-17 LAB — COMPREHENSIVE METABOLIC PANEL
ALT: 26 U/L (ref 10–40)
AST: 23 U/L (ref 15–37)
Albumin/Globulin Ratio: 1.3 (ref 1.1–2.2)
Albumin: 4.3 gm/dl (ref 3.4–5.0)
Alkaline Phosphatase: 95 U/L (ref 45–129)
BUN: 15 mg/dl (ref 7–18)
CO2: 30 mEq/L (ref 21–32)
Calcium: 9.9 mg/dl (ref 8.3–10.6)
Chloride: 107 mEq/L (ref 99–110)
Creatinine: 0.9 mg/dl (ref 0.6–1.1)
GFR Est, African/Amer: >60
GFR, Estimated: >60 (ref >60)
Glucose: 85 mg/dl (ref 70–99)
Potassium: 3.9 mEq/L (ref 3.5–5.1)
Sodium: 143 mEq/L (ref 136–145)
Total Bilirubin: 0.7 mg/dl (ref 0.0–1.0)
Total Protein: 7.5 gm/dl (ref 6.4–8.2)

## 2012-03-17 LAB — LIPID PANEL
Cholesterol, Total: 195 mg/dl (ref <200)
HDL: 62 mg/dl — ABNORMAL HIGH (ref 40–60)
LDL Calculated: 115 mg/dl — ABNORMAL HIGH (ref <100)
Triglycerides: 89 mg/dl (ref <150)
VLDL Cholesterol Calculated: 18 mg/dl

## 2012-03-17 NOTE — ED Provider Notes (Signed)
HPI  Chief Complaint:    Chief Complaint   Patient presents with   . Constipation     c/o constipation for two weeks but nothing per rectum in 3 days; states she's tried laxative, saline enema, 3 suppositories, 1 mineral oil enema, prunes, MOM, all without relief; EXPECTATION: "relief"       Nursing Notes, Past Medical Hx, Past Surgical Hx, Social Hx, Allergies, and Family Hx were all reviewed and agreed with or any disagreements were addressed in the HPI.    HPI:  (Location, Duration, Timing, Severity, Quality, Assoc Sx, Context, Modifying factors)  This is a  56 y.o. female presents with complaint of being constipated for the past several weeks.  She passed a small amount of stool 3 days ago, and perfomed a rectal exam on herself and could feel stool that felt stuck.  She tried using a mineral oil enema, eating prunes, and taking laxatives today without success.  She continues to pass flatus.  Denies abdominal pain.    Past Medical/Surgical History:      Diagnosis Date   . Chronic back pain    . Deep vein thrombosis    . Depression    . GERD (gastroesophageal reflux disease)      NO LONGER SINCE LAP   . Pulmonary embolism    . Obesity      hx of; had lap band   . Hypertension      no problems since lap SURGERY   . Hyperlipidemia      hx; resolved with lap band         Procedure Laterality Date   . Cesarean section     . Cholecystectomy     . Hysterectomy     . Shoulder surgery     . Lap band  05/01/08     Dr. Suzi Roots   . Back surgery       neck  plates   . Colonoscopy  04/08/10       Medications:  Previous Medications    ALBUTEROL (PROVENTIL;VENTOLIN) 90 MCG/ACT INHALER    Inhale 2 puffs into the lungs every 6 hours as needed.    ALPRAZOLAM (XANAX) 0.5 MG TABLET    Take 1 tablet by mouth 3 times daily as needed for Sleep or Anxiety.    CALCIUM CARBONATE 600 MG TABS TABLET    Take 1 tablet by mouth 2 times daily.      ESCITALOPRAM (LEXAPRO) 10 MG TABLET    Take 10 mg by mouth daily.    FLUCONAZOLE (DIFLUCAN) 100 MG  TABLET    Take 2 day one then 1 daily x 2 weeks    HYDROCHLOROTHIAZIDE (HYDRODIURIL) 25 MG TABLET    Take 1 tablet by mouth daily.    MULTIPLE VITAMIN (MULTIVITAMIN PO)    Take 1 tablet by mouth 2 times daily.    NYSTATIN (MYCOSTATIN) POWDER    Apply 3 times daily.    VITAMIN D (CHOLECALCIFEROL) 1000 UNITS CAPS CAPSULE    Take 1,000 Units by mouth 2 times daily.         Review of Systems   Constitutional: Negative for fever and chills.   Respiratory: Negative for shortness of breath.    Cardiovascular: Negative for chest pain.   Gastrointestinal: Positive for constipation. Negative for nausea, vomiting, abdominal pain and diarrhea.   Skin: Negative for pallor.   All other systems reviewed and are negative.        Physical Exam  Nursing note and vitals reviewed.  Constitutional: She is oriented to person, place, and time. She appears well-developed and well-nourished.   HENT:   Head: Normocephalic and atraumatic.   Neck: Normal range of motion. Neck supple.   Pulmonary/Chest: Effort normal. No respiratory distress.   Abdominal: Soft. She exhibits no distension and no mass. There is no tenderness. There is no rebound and no guarding.   Genitourinary:   Normal rectal tone.  Non-inflammed external hemorrhoids present.  Stool impaction present within the rectum with some manual disimpaction performed.  Stool broken up manually as well.   Musculoskeletal: Normal range of motion.   Neurological: She is alert and oriented to person, place, and time.   Skin: She is not diaphoretic. No pallor.       Procedures    MDM  MEDICAL DECISION MAKING    Vitals:    Filed Vitals:    03/17/12 2353   BP: 141/89   Pulse: 75   Temp: 99 F (37.2 C)   TempSrc: Oral   Resp: 18   Height: 5\' 4"  (1.626 m)   Weight: 213 lb (96.616 kg)   SpO2: 97%       LABS: Labs Reviewed - No data to display     Remainder of labs reviewed and were negative at this time or were not returned as of this dictation.    EKG: N/A    X-RAYS: I Drue Flirt, MD have  reviewed radiologic plain film image(s).  The plain films will be read or overread by the radiologist.  ALL OTHER NON-PLAIN FILM IMAGES SUCH AS CT, ULTRASOUND AND MRI HAVE BEEN READ BY THE RADIOLOGIST.        No results found.     MEDICAL DECISION MAKING / ED COURSE:      Patient presents with complaint of constipation with stool impaction present.  Manually disimpacted with some success, however unable to reach the majority of stool.  Abdomen is soft and benign without tenderness.  Do not suspect bowel obstruction at this time.  Soap suds enema given in the ER with good result and patient passed some stool.  She still feels some rectal pressure and that more stool is present.  Will prescribe GoLytely for home use, and follow up with Dr. Mack Hook as directed.  Return precautions given.    The patient tolerated their visit well. The patient and / or the family were informed of the results of any tests, a time was given to answer questions, a plan was proposed and they agreed with plan.      CLINICAL IMPRESSION:  1. Constipation          DISCHARGE MEDICATIONS:  New Prescriptions    POLYETHYLENE GLYCOL (GOLYTELY) 236 G SUSPENSION    Take 4,000 mLs by mouth once for 1 dose.       (Please note the MDM and HPI sections of this note were completed with a voice recognition program.  Efforts were made to edit the dictations but occasionally words are mis-transcribed.)        Drue Flirt, MD  03/18/12 [phone]

## 2012-03-18 ENCOUNTER — Inpatient Hospital Stay
Admit: 2012-03-18 | Discharge: 2012-03-18 | Disposition: A | Discharge status: Home / Self Care | Attending: Emergency Medicine

## 2012-03-18 MED ORDER — PEG 3350-KCL-NABCB-NACL-NASULF 236 G PO SOLR
236 g | Freq: Once | ORAL | Status: AC
Start: 2012-03-18 — End: 2012-03-18

## 2012-03-18 NOTE — Discharge Instructions (Signed)
Start drinking the GoLytely until bowel movements progress.  If you have any increasing abdominal pain or distension/bloating, vomiting, fever or other concerns, return to the ER.  Follow up with DR. Slough as directed.    Constipation in Adults  Constipation is having fewer than 2 bowel movements per week. Usually, the stools are hard. As we grow older, constipation is more common. If you try to fix constipation with laxatives, the problem may get worse. This is because laxatives taken over a long period of time make the colon muscles weaker. A low-fiber diet, not taking in enough fluids, and taking some medicines may make these problems worse.  MEDICATIONS THAT MAY CAUSE CONSTIPATION   Water pills (diuretics).   Calcium channel blockers (used to control blood pressure and for the heart).   Certain pain medicines (narcotics).   Anticholinergics.   Anti-inflammatory agents.   Antacids that contain aluminum.  DISEASES THAT CONTRIBUTE TO CONSTIPATION   Diabetes.   Parkinson's disease.   Dementia.   Stroke.   Depression.   Illnesses that cause problems with salt and water metabolism.  HOME CARE INSTRUCTIONS    Constipation is usually best cared for without medicines. Increasing dietary fiber and eating more fruits and vegetables is the best way to manage constipation.   Slowly increase fiber intake to 25 to 38 grams per day. Whole grains, fruits, vegetables, and legumes are good sources of fiber. A dietitian can further help you incorporate high-fiber foods into your diet.   Drink enough water and fluids to keep your urine clear or pale yellow.   A fiber supplement may be added to your diet if you cannot get enough fiber from foods.   Increasing your activities also helps improve regularity.   Suppositories, as suggested by your caregiver, will also help. If you are using antacids, such as aluminum or calcium containing products, it will be helpful to switch to products containing magnesium if your  caregiver says it is okay.   If you have been given a liquid injection (enema) today, this is only a temporary measure. It should not be relied on for treatment of longstanding (chronic) constipation.   Stronger measures, such as magnesium sulfate, should be avoided if possible. This may cause uncontrollable diarrhea. Using magnesium sulfate may not allow you time to make it to the bathroom.  SEEK IMMEDIATE MEDICAL CARE IF:    There is bright red blood in the stool.   The constipation stays for more than 4 days.   There is belly (abdominal) or rectal pain.   You do not seem to be getting better.   You have any questions or concerns.  MAKE SURE YOU:    Understand these instructions.   Will watch your condition.   Will get help right away if you are not doing well or get worse.  Document Released: 08/21/2004 Document Revised: 11/12/2011 Document Reviewed: 10/27/2011  [facility] Patient Information 2012 [place], [place].

## 2012-03-18 NOTE — ED Notes (Signed)
Pt discharge to home in stable condition.  All belongings in tow including discharge paperwork. Family at bedside.       Michiel Sites, RN  03/18/12 [phone]

## 2012-03-19 LAB — ZINC: Zinc: 81 ug/dl (ref 60–120)

## 2012-04-14 MED ORDER — LUBIPROSTONE 8 MCG PO CAPS
8 MCG | ORAL_CAPSULE | Freq: Two times a day (BID) | ORAL | Status: DC
Start: 2012-04-14 — End: 2012-04-26

## 2012-04-14 MED ORDER — HYDROCHLOROTHIAZIDE 25 MG PO TABS
25 MG | ORAL_TABLET | Freq: Every day | ORAL | Status: DC
Start: 2012-04-14 — End: 2012-12-22

## 2012-04-14 NOTE — Progress Notes (Signed)
Subjective:      Patient ID: Rachael Mays is a 56 y.o. female.    HPI  Patient presents with persistent rash.  Rash initially in groin, under panus, and beneath bilateral breasts.  Tx with diflucan helps but symptoms recurrent.  Nystatin tends to be irritating.  Patient hoping that symptoms would qualify her for panus removal (patient is s/p lap band).      Constipation also problematic.  Last colonoscopy 1 year ago.  Recently required disimpaction in ER.  Takes lactulose prn.  Sx present since lap band surgery.      Mood improved on lexapro.  Prefers this as opposed to citalopram.    Review of Systems    Objective:   Physical Exam   Constitutional: She is oriented to person, place, and time. She appears well-developed and well-nourished. No distress.   Neurological: She is oriented to person, place, and time.   Skin:   Beneath panus, mild erythema c/w intertrigo       Assessment:     1. Constipation  lubiprostone (AMITIZA) 8 MCG CAPS capsule   2. Intertrigo     3. Depression     4. Hypertension  hydrochlorothiazide (HYDRODIURIL) 25 MG tablet       Plan:     Referral to dermatology for opinion regarding preventative measures for recurrent intertrigo.  Hopefully, surgery can be avoided.  Add amitiza for chronic constipation.  Lexapro for depression.  HCTZ 3 month supply sent to pharmacy.

## 2012-04-26 ENCOUNTER — Telehealth

## 2012-04-26 MED ORDER — LUBIPROSTONE 24 MCG PO CAPS
24 MCG | ORAL_CAPSULE | Freq: Two times a day (BID) | ORAL | Status: DC
Start: 2012-04-26 — End: 2012-04-29

## 2012-04-26 NOTE — Telephone Encounter (Signed)
Ignore my previous message.  Change amitiza to bid.  When was last colonoscopy?  If no previous, needs referral GI.  Let me know.  Thanks!

## 2012-04-26 NOTE — Telephone Encounter (Signed)
Pt said she had a colonoscopy 2 years ago, and she is to repeat in 5 years.  Called in increased dose

## 2012-04-26 NOTE — Telephone Encounter (Signed)
TS prescribed Amitiza to treat constipation.  States it is not helping. She feels like she may even be worse than before.  Has been taking it BID. Believes she needs to up the dose or change meds.  Please advise

## 2012-04-26 NOTE — Telephone Encounter (Signed)
May try linzess daily.  When was colonoscopy (not seeing it in chart).  Needs GI referral.

## 2012-04-29 ENCOUNTER — Telehealth

## 2012-04-29 MED ORDER — LUBIPROSTONE 24 MCG PO CAPS
24 MCG | ORAL_CAPSULE | Freq: Two times a day (BID) | ORAL | Status: DC
Start: 2012-04-29 — End: 2012-04-29

## 2012-04-29 MED ORDER — LUBIPROSTONE 24 MCG PO CAPS
24 MCG | ORAL_CAPSULE | Freq: Two times a day (BID) | ORAL | Status: DC
Start: 2012-04-29 — End: 2012-12-15

## 2012-04-29 NOTE — Telephone Encounter (Signed)
Pt said that she spoke with TS on Tues, Amitiza was supposed to be sent to [facility].  [facility] said they do not have this rx.  (shows that it was sent).  [place] [facility] [place], requesting for this to be called into [facility] today.  Please call her when this is called in so that she can go p/u the rx.

## 2012-05-19 MED ORDER — GABAPENTIN 100 MG PO CAPS
100 MG | ORAL_CAPSULE | Freq: Every evening | ORAL | Status: DC
Start: 2012-05-19 — End: 2012-05-31

## 2012-05-19 NOTE — Progress Notes (Signed)
Subjective:      Patient ID: Rachael Mays is a 56 y.o. female.    HPIPatient presents today she's had off and on head neck and bilateral shoulder discomfort for quite some time.  She denies any radicular symptoms.  She had a cardiac evaluation year ago.  She feels her symptoms are worsened throughout the day but she always has discomfort.  She describes discomfort discomfort as a tightness in her neck that sometimes radiates down to her shoulders and both her arms.  She denies any shortness of breath or pleuritic pain to this her symptoms do not worsen with neck range of motion.  She has tried anti-inflammatory medications with no improvement.    Review of Systems  Allergies   Allergen Reactions   . Nystatin Other (See Comments)     "welts"       . Talwin Rash       Objective:   Physical Exam   Constitutional: She appears well-developed and well-nourished. No distress.   HENT:   Right Ear: Tympanic membrane, external ear and ear canal normal.   Left Ear: Tympanic membrane, external ear and ear canal normal.   Nose: Nose normal.   Mouth/Throat: Oropharynx is clear and moist. No oropharyngeal exudate.   Musculoskeletal:        Cervical back: She exhibits spasm. She exhibits no bony tenderness, no edema and no deformity.   Neurological: She has normal strength. No sensory deficit.   Reflex Scores:       Tricep reflexes are 2+ on the right side and 2+ on the left side.       Bicep reflexes are 2+ on the right side and 2+ on the left side.  Skin: She is not diaphoretic.       Assessment:      Rachael Mays was seen today for jaw pain.    Diagnoses and associated orders for this visit:    Cervical radiculopathy    Other Orders  - gabapentin (NEURONTIN) 100 MG capsule; Take 1 capsule by mouth nightly.               Plan:      Patient is to call back in 2 weeks if she does not make any improvement with the neck symptoms which increase the gabapentin 300 mg.

## 2012-05-31 NOTE — Progress Notes (Signed)
Subjective:      Patient ID: Rachael Mays is a 56 y.o. female.    HPI Two month history of bilateral jaw, neck, and arm pain.  Pain is constant but worse at times.  When pain becomes really bad feels nauseated.  Palpitations periodically lasting up to one hour.  Pain can occur at rest or with activity.  Associated at times with chest pain.  Neurontin did not help with symptoms.  No injury or triggering activity.  Sx associated with posterior headache, fatigue, dizziness.    Extremely worried due to family hx that underlying etiology of sx could be cardiac.  Stress test 06/2011 showed no evidence of ischemia.      FHx:  Mom MI @ 61, PGF and MGM with MI, brother with cardiomyopathy    Review of Systems    Objective:   Physical Exam   Constitutional: She is oriented to person, place, and time. She appears well-developed and well-nourished. No distress.   Neck: Neck supple. No thyromegaly present.   Cardiovascular: Normal rate, regular rhythm and normal heart sounds.    No murmur heard.  Pulmonary/Chest: Effort normal and breath sounds normal. She has no wheezes. She has no rales.   Neurological: She is alert and oriented to person, place, and time.   Psychiatric: She has a normal mood and affect. Her behavior is normal.       Assessment:     1. Chest pain  EKG 12 Lead       Plan:   Referral to cardiology for evaluation for possible heart cath.

## 2012-06-02 MED ORDER — VARENICLINE TARTRATE 0.5 MG X 11 & 1 MG X 42 PO MISC
0.5 MG X 11 & 1 MG X 42 | ORAL_TABLET | ORAL | Status: DC
Start: 2012-06-02 — End: 2012-10-20

## 2012-06-02 MED ORDER — VARENICLINE TARTRATE 1 MG PO TABS
1 MG | ORAL_TABLET | Freq: Two times a day (BID) | ORAL | Status: DC
Start: 2012-06-02 — End: 2012-12-15

## 2012-06-02 NOTE — Progress Notes (Signed)
[facility]   Cardiovascular Evaluation    PATIENT: Rachael Mays  DATE: 06/02/2012  MRN: [id number]  CSN: [id number]  DOB: 10/14/1956    Primary Care Doctor: TERESA M O'BRIEN SLOUGH, MD    Reason for evaluation:   Established New Doctor      History of present illness:   Rachael Mays is a 56 y.o. who presents with complaints of palpitations associated with chest and jaw pain. She also has dizziness which she describes as a spinning sensation. Her chest discomfort is described as a heaviness with radiation of an achiness in her jaw. She states that this discomfort occurs with exertion but is not relieved with rest. It continues all day until she goes to sleep. It can also occur at rest. She denies DOE/SOB. She has recently lost 100 pounds. She has had a normal stress test and echo in the past. 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 She is not on Coumadin any longer.     Cardiac Testing: I have reviewed the findings below.    ECHO: 11/10: Normal LV, Borderline LVH  STRESS TEST: 6/12: Normal myocardial perfusion        Patient Active Problem List   Diagnosis   ??? Back Pain   ??? Deep Vein Thrombosis   ??? Depression   ??? High Cholesterol   ??? Hypertension   ??? Pulmonary Embolism   ??? Abdominal  Pain, Other Specified Site   ??? Morbid Obesity   ??? Status post gastric banding       Past Medical History: has a past medical history of Chronic back pain; Deep vein thrombosis; Depression; GERD (gastroesophageal reflux disease); Pulmonary embolism; Obesity; Hypertension; and Hyperlipidemia.  Surgical History:   has past surgical history that includes Cesarean section; Cholecystectomy; Hysterectomy; shoulder surgery; Lap Band (05/01/08); back surgery; Colonoscopy (04/08/10); Spine surgery; and other surgical history. Social History: reports that she has been smoking Cigarettes.  She has a 17.5 pack-year smoking history. She has never used smokeless tobacco. She reports that she does not drink alcohol or use  illicit drugs.   Family History:No evidence for sudden cardiac death or premature CAD    Current Outpatient Prescriptions   Medication Sig Dispense Refill   ??? lubiprostone (AMITIZA) 24 MCG capsule Take 1 capsule by mouth 2 times daily (with meals).  180 capsule  1   ??? cyclobenzaprine (FLEXERIL) 10 MG tablet Take 10 mg by mouth 3 times daily as needed.       ??? hydrochlorothiazide (HYDRODIURIL) 25 MG tablet Take 1 tablet by mouth daily.  90 tablet  3   ??? escitalopram (LEXAPRO) 10 MG tablet Take 10 mg by mouth daily.       ??? nystatin (MYCOSTATIN) powder Apply 3 times daily.  1 Bottle  12   ??? ALPRAZolam (XANAX) 0.5 MG tablet Take 1 tablet by mouth 3 times daily as needed for Sleep or Anxiety.  30 tablet  0   ??? calcium carbonate 600 MG TABS tablet Take 1 tablet by mouth 2 times daily.         ??? Vitamin D (CHOLECALCIFEROL) 1000 UNITS CAPS capsule Take 1,000 Units by mouth 2 times daily.         ??? Multiple Vitamin (MULTIVITAMIN PO) Take 1 tablet by mouth 2 times daily.       ??? albuterol (PROVENTIL;VENTOLIN) 90 MCG/ACT inhaler Inhale 2 puffs into the lungs every 6 hours as needed.           No current facility-administered medications for this visit.     Allergies:Nystatin and Talwin     Review of Systems:   All 12 point review of symptoms completed. Pertinent positives identified in the HPI, all other review of symptoms negative as below.    Physical Examination:    Filed Vitals:    06/02/12 0849   BP: 108/64   Pulse: 60    Weight: 210 lb (95.255 kg)     Wt Readings from Last 3 Encounters:   06/02/12 210 lb (95.255 kg)   05/31/12 208 lb 3.2 oz (94.439 kg)   05/19/12 209 lb (94.802 kg)         General Appearance:  Alert, cooperative, no distress, appears stated age   Head:  Normocephalic, without obvious abnormality, atraumatic   Eyes:  PERRL, conjunctiva/corneas clear       Nose: Nares normal, no drainage or sinus tenderness   Throat: Lips, mucosa, and tongue normal   Neck: Supple, symmetrical, trachea midline, no  adenopathy, thyroid: not enlarged, symmetric, no tenderness/mass/nodules, no carotid bruit or JVD       Lungs:   Clear to auscultation bilaterally, respirations unlabored   Chest Wall:  No tenderness or deformity   Heart:  Regular rate and rhythm, S1, S2 normal, 1/6 SEM LLSB murmur, rub or gallop   Abdomen:   Soft, non-tender, bowel sounds active all four quadrants,  no masses, no organomegaly           Extremities: Extremities normal, atraumatic, no cyanosis or edema   Pulses: 2+ and symmetric   Skin: Skin color, texture, turgor normal, no rashes or lesions   Pysch: Normal mood and affect   Neurologic: Normal gross motor and sensory exam.         Labs  CBC:   Lab Results   Component Value Date    WBC 8.5 03/16/2012    RBC 5.05 03/16/2012    HGB 14.5 03/16/2012    HCT 43.9 03/16/2012    MCV 86.9 03/16/2012    RDW 14.3 03/16/2012    PLT 208 03/16/2012     CMP:    Lab Results   Component Value Date    NA 143 03/16/2012    K 3.9 03/16/2012    CL 107 03/16/2012    CO2 30 03/16/2012    BUN 15 03/16/2012    CREATININE 0.9 03/16/2012    CREATININE 0.8 05/15/2011    GFRAA >60 03/16/2012    AGRATIO 1.3 03/16/2012    GLUCOSE 85 03/16/2012    PROT 7.5 03/16/2012    PROT 7.3 03/31/2011    CALCIUM 9.9 03/16/2012    BILITOT 0.70 03/16/2012    ALKPHOS 95 03/16/2012    AST 23 03/16/2012    ALT 26 03/16/2012     PT/INR:    No components found with this basename: PTPATIENT, PTINR     Lab Results   Component Value Date    TROPONINI <0.01 05/15/2011     No components found with this basename: CHLPL     Lab Results   Component Value Date    TRIG 89 03/16/2012    TRIG 132 03/31/2011    TRIG 126 03/26/2010     Lab Results   Component Value Date    HDL 62* 03/16/2012    HDL 52 03/31/2011    HDL 53 03/26/2010     Lab Results   Component Value Date    LDLCALC 115* 03/16/2012    LDLCALC 125*   03/31/2011    LDLCALC 141* 03/26/2010     Lab Results   Component Value Date    LABVLDL 18 03/16/2012    LABVLDL 26 03/31/2011    LABVLDL 25 03/26/2010     Lab Results   Component Value Date       ALT 26 03/16/2012    AST 23 03/16/2012    ALKPHOS 95 03/16/2012    BILITOT 0.70 03/16/2012       Assessment:  1. HIGH CHOLESTEROL    2. Hypertension    3. Pulmonary embolism        Plan: I talked to Mrs. Froio at length regarding her symptoms. Due to her previous normal stress test but her concerning symptoms, I feel that a coronary angiogram would be appropriate. We discussed the risks, benefits and imponderables. We will apply a 24 hour Holter monitor today to assess her palpitations.       All questions and concerns were addressed to the patient/family. Alternatives to my treatment were discussed. The note was completed using EMR. Every effort was made to ensure accuracy; however, inadvertent computerized transcription errors may be present.    Suzi Hernan, MD,  FACC, FSCAI  [phone] Main central office  [phone] [place] office  [phone] [place] office  06/02/2012  8:57 AM

## 2012-06-02 NOTE — Communication Body (Signed)
[facility]   Cardiovascular Evaluation    PATIENT: Rachael Mays  DATE: 06/02/2012  MRN: [id number]  CSN: [id number]  DOB: Apr 10, 1956    Primary Care Doctor: Margart Sickles SLOUGH, MD    Reason for evaluation:   Established New Doctor      History of present illness:   Rachael Mays is a 56 y.o. who presents with complaints of palpitations associated with chest and jaw pain. She also has dizziness which she describes as a spinning sensation. Her chest discomfort is described as a heaviness with radiation of an achiness in her jaw. She states that this discomfort occurs with exertion but is not relieved with rest. It continues all day until she goes to sleep. It can also occur at rest. She denies DOE/SOB. She has recently lost 100 pounds. She has had a normal stress test and echo in the past. 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 She is not on Coumadin any longer.     Cardiac Testing: I have reviewed the findings below.    ECHO: 11/10: Normal LV, Borderline LVH  STRESS TEST: 6/12: Normal myocardial perfusion        Patient Active Problem List   Diagnosis   ??? Back Pain   ??? Deep Vein Thrombosis   ??? Depression   ??? High Cholesterol   ??? Hypertension   ??? Pulmonary Embolism   ??? Abdominal  Pain, Other Specified Site   ??? Morbid Obesity   ??? Status post gastric banding       Past Medical History: has a past medical history of Chronic back pain; Deep vein thrombosis; Depression; GERD (gastroesophageal reflux disease); Pulmonary embolism; Obesity; Hypertension; and Hyperlipidemia.  Surgical History:   has past surgical history that includes Cesarean section; Cholecystectomy; Hysterectomy; shoulder surgery; Lap Band (05/01/08); back surgery; Colonoscopy (04/08/10); Spine surgery; and other surgical history. Social History: reports that she has been smoking Cigarettes.  She has a 17.5 pack-year smoking history. She has never used smokeless tobacco. She reports that she does not drink alcohol or use  illicit drugs.   Family History:No evidence for sudden cardiac death or premature CAD    Current Outpatient Prescriptions   Medication Sig Dispense Refill   ??? lubiprostone (AMITIZA) 24 MCG capsule Take 1 capsule by mouth 2 times daily (with meals).  180 capsule  1   ??? cyclobenzaprine (FLEXERIL) 10 MG tablet Take 10 mg by mouth 3 times daily as needed.       ??? hydrochlorothiazide (HYDRODIURIL) 25 MG tablet Take 1 tablet by mouth daily.  90 tablet  3   ??? escitalopram (LEXAPRO) 10 MG tablet Take 10 mg by mouth daily.       ??? nystatin (MYCOSTATIN) powder Apply 3 times daily.  1 Bottle  12   ??? ALPRAZolam (XANAX) 0.5 MG tablet Take 1 tablet by mouth 3 times daily as needed for Sleep or Anxiety.  30 tablet  0   ??? calcium carbonate 600 MG TABS tablet Take 1 tablet by mouth 2 times daily.         ??? Vitamin D (CHOLECALCIFEROL) 1000 UNITS CAPS capsule Take 1,000 Units by mouth 2 times daily.         ??? Multiple Vitamin (MULTIVITAMIN PO) Take 1 tablet by mouth 2 times daily.       ??? albuterol (PROVENTIL;VENTOLIN) 90 MCG/ACT inhaler Inhale 2 puffs into the lungs every 6 hours as needed.  No current facility-administered medications for this visit.     Allergies:Nystatin and Talwin     Review of Systems:   All 12 point review of symptoms completed. Pertinent positives identified in the HPI, all other review of symptoms negative as below.    Physical Examination:    Filed Vitals:    06/02/12 0849   BP: 108/64   Pulse: 60    Weight: 210 lb (95.255 kg)     Wt Readings from Last 3 Encounters:   06/02/12 210 lb (95.255 kg)   05/31/12 208 lb 3.2 oz (94.439 kg)   05/19/12 209 lb (94.802 kg)         General Appearance:  Alert, cooperative, no distress, appears stated age   Head:  Normocephalic, without obvious abnormality, atraumatic   Eyes:  PERRL, conjunctiva/corneas clear       Nose: Nares normal, no drainage or sinus tenderness   Throat: Lips, mucosa, and tongue normal   Neck: Supple, symmetrical, trachea midline, no  adenopathy, thyroid: not enlarged, symmetric, no tenderness/mass/nodules, no carotid bruit or JVD       Lungs:   Clear to auscultation bilaterally, respirations unlabored   Chest Wall:  No tenderness or deformity   Heart:  Regular rate and rhythm, S1, S2 normal, 1/6 SEM LLSB murmur, rub or gallop   Abdomen:   Soft, non-tender, bowel sounds active all four quadrants,  no masses, no organomegaly           Extremities: Extremities normal, atraumatic, no cyanosis or edema   Pulses: 2+ and symmetric   Skin: Skin color, texture, turgor normal, no rashes or lesions   Pysch: Normal mood and affect   Neurologic: Normal gross motor and sensory exam.         Labs  CBC:   Lab Results   Component Value Date    WBC 8.5 03/16/2012    RBC 5.05 03/16/2012    HGB 14.5 03/16/2012    HCT 43.9 03/16/2012    MCV 86.9 03/16/2012    RDW 14.3 03/16/2012    PLT 208 03/16/2012     CMP:    Lab Results   Component Value Date    NA 143 03/16/2012    K 3.9 03/16/2012    CL 107 03/16/2012    CO2 30 03/16/2012    BUN 15 03/16/2012    CREATININE 0.9 03/16/2012    CREATININE 0.8 05/15/2011    GFRAA >60 03/16/2012    AGRATIO 1.3 03/16/2012    GLUCOSE 85 03/16/2012    PROT 7.5 03/16/2012    PROT 7.3 03/31/2011    CALCIUM 9.9 03/16/2012    BILITOT 0.70 03/16/2012    ALKPHOS 95 03/16/2012    AST 23 03/16/2012    ALT 26 03/16/2012     PT/INR:    No components found with this basename: PTPATIENT, PTINR     Lab Results   Component Value Date    TROPONINI <0.01 05/15/2011     No components found with this basename: CHLPL     Lab Results   Component Value Date    TRIG 89 03/16/2012    TRIG 132 03/31/2011    TRIG 126 03/26/2010     Lab Results   Component Value Date    HDL 62* 03/16/2012    HDL 52 03/31/2011    HDL 53 03/26/2010     Lab Results   Component Value Date    LDLCALC 115* 03/16/2012    LDLCALC 125*  03/31/2011    LDLCALC 141* 03/26/2010     Lab Results   Component Value Date    LABVLDL 18 03/16/2012    LABVLDL 26 03/31/2011    LABVLDL 25 03/26/2010     Lab Results   Component Value Date       ALT 26 03/16/2012    AST 23 03/16/2012    ALKPHOS 95 03/16/2012    BILITOT 0.70 03/16/2012       Assessment:  1. HIGH CHOLESTEROL    2. Hypertension    3. Pulmonary embolism        Plan: I talked to Mrs. Stavros at length regarding her symptoms. Due to her previous normal stress test but her concerning symptoms, I feel that a coronary angiogram would be appropriate. We discussed the risks, benefits and imponderables. We will apply a 24 hour Holter monitor today to assess her palpitations.       All questions and concerns were addressed to the patient/family. Alternatives to my treatment were discussed. The note was completed using EMR. Every effort was made to ensure accuracy; however, inadvertent computerized transcription errors may be present.    Wonda Cerise, MD,  [facility], [place]  [phone] Main central office  [phone] [facility] office  [phone] [facility] office  06/02/2012  8:57 AM

## 2012-06-03 LAB — WBC: WBC: 8 10*3/uL (ref 4.0–11.0)

## 2012-06-03 LAB — PLATELET COUNT: Platelets: 181 10*3/uL (ref 135–450)

## 2012-06-03 MED FILL — MIDAZOLAM HCL 2 MG/2ML IJ SOLN: 2 MG/ML | INTRAMUSCULAR | Qty: 2

## 2012-06-03 MED FILL — DIPHENHYDRAMINE HCL 50 MG/ML IJ SOLN: 50 MG/ML | INTRAMUSCULAR | Qty: 1

## 2012-06-03 MED FILL — HEPARIN (PORCINE) IN NACL 2-0.9 UNIT/ML-% IJ SOLN: INTRAMUSCULAR | Qty: 1000

## 2012-06-03 MED FILL — FENTANYL CITRATE 0.05 MG/ML IJ SOLN: 0.05 MG/ML | INTRAMUSCULAR | Qty: 2

## 2012-06-03 MED FILL — SODIUM CHLORIDE 0.9 % IV SOLN: 0.9 % | INTRAVENOUS | Qty: 50

## 2012-06-03 MED FILL — LIDOCAINE HCL (PF) 1 % IJ SOLN: 1 % | INTRAMUSCULAR | Qty: 30

## 2012-06-03 MED FILL — NORMAL SALINE FLUSH 0.9 % IV SOLN: 0.9 % | INTRAVENOUS | Qty: 10

## 2012-06-03 NOTE — H&P (Signed)
[facility]   Cardiovascular Evaluation    PATIENT: Rachael Mays  DATE: 06/03/2012  MRN: [id number]  CSN: [id number]  DOB: [birth year]    Primary Care Doctor: Margart Sickles SLOUGH, MD    Reason for evaluation:   No chief complaint on file.      History of present illness:   Rachael Mays is a 56 y.o. who presents with complaints of palpitations associated with chest and jaw pain. She also has dizziness which she describes as a spinning sensation. Her chest discomfort is described as a heaviness with radiation of an achiness in her jaw. She states that this discomfort occurs with exertion but is not relieved with rest. It continues all day until she goes to sleep. It can also occur at rest. She denies DOE/SOB. She has recently lost 100 pounds. She has had a normal stress test and echo in the past. 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 She is not on Coumadin any longer.     Cardiac Testing: I have reviewed the findings below.    ECHO: 11/10: Normal LV, Borderline LVH  STRESS TEST: 6/12: Normal myocardial perfusion        Patient Active Problem List   Diagnosis   ??? Back Pain   ??? Deep Vein Thrombosis   ??? Depression   ??? High Cholesterol   ??? Hypertension   ??? Pulmonary Embolism   ??? Abdominal  Pain, Other Specified Site   ??? Morbid Obesity   ??? Status post gastric banding       Past Medical History: has a past medical history of Chronic back pain; Deep vein thrombosis; Depression; GERD (gastroesophageal reflux disease); Pulmonary embolism; Obesity; Hypertension; and Hyperlipidemia.  Surgical History:   has past surgical history that includes Cesarean section; Cholecystectomy; Hysterectomy; shoulder surgery; Lap Band (05/01/08); back surgery; Colonoscopy (04/08/10); Spine surgery; and other surgical history. Social History: reports that she has been smoking Cigarettes.  She has a 17.5 pack-year smoking history. She has never used smokeless tobacco. She reports that she does not drink alcohol  or use illicit drugs.   Family History:No evidence for sudden cardiac death or premature CAD    Current Outpatient Prescriptions   Medication Sig Dispense Refill   ??? lubiprostone (AMITIZA) 24 MCG capsule Take 1 capsule by mouth 2 times daily (with meals).  180 capsule  1   ??? cyclobenzaprine (FLEXERIL) 10 MG tablet Take 10 mg by mouth 3 times daily as needed.       ??? hydrochlorothiazide (HYDRODIURIL) 25 MG tablet Take 1 tablet by mouth daily.  90 tablet  3   ??? escitalopram (LEXAPRO) 10 MG tablet Take 10 mg by mouth daily.       ??? nystatin (MYCOSTATIN) powder Apply 3 times daily.  1 Bottle  12   ??? ALPRAZolam (XANAX) 0.5 MG tablet Take 1 tablet by mouth 3 times daily as needed for Sleep or Anxiety.  30 tablet  0   ??? calcium carbonate 600 MG TABS tablet Take 1 tablet by mouth 2 times daily.         ??? Vitamin D (CHOLECALCIFEROL) 1000 UNITS CAPS capsule Take 1,000 Units by mouth 2 times daily.         ??? Multiple Vitamin (MULTIVITAMIN PO) Take 1 tablet by mouth 2 times daily.       ??? albuterol (PROVENTIL;VENTOLIN) 90 MCG/ACT inhaler Inhale 2 puffs into the lungs every 6 hours as  needed.       ??? varenicline (CHANTIX STARTING MONTH PAK) 0.5 MG X 11 & 1 MG X 42 tablet Take by mouth.  53 tablet  0   ??? varenicline (CHANTIX CONTINUING MONTH PAK) 1 MG tablet Take 1 tablet by mouth 2 times daily.  60 tablet  3     Current Facility-Administered Medications   Medication Dose Route Frequency Provider Last Rate Last Dose   ??? sodium chloride 0.9 % infusion              Allergies:Talwin     Review of Systems:   All 12 point review of symptoms completed. Pertinent positives identified in the HPI, all other review of symptoms negative as below.    Physical Examination:    There were no vitals filed for this visit.       Wt Readings from Last 3 Encounters:   06/02/12 210 lb (95.255 kg)   05/31/12 208 lb 3.2 oz (94.439 kg)   05/19/12 209 lb (94.802 kg)         General Appearance:  Alert, cooperative, no distress, appears stated age   Head:   Normocephalic, without obvious abnormality, atraumatic   Eyes:  PERRL, conjunctiva/corneas clear       Nose: Nares normal, no drainage or sinus tenderness   Throat: Lips, mucosa, and tongue normal   Neck: Supple, symmetrical, trachea midline, no adenopathy, thyroid: not enlarged, symmetric, no tenderness/mass/nodules, no carotid bruit or JVD       Lungs:   Clear to auscultation bilaterally, respirations unlabored   Chest Wall:  No tenderness or deformity   Heart:  Regular rate and rhythm, S1, S2 normal, 1/6 SEM LLSB murmur, rub or gallop   Abdomen:   Soft, non-tender, bowel sounds active all four quadrants,  no masses, no organomegaly           Extremities: Extremities normal, atraumatic, no cyanosis or edema   Pulses: 2+ and symmetric   Skin: Skin color, texture, turgor normal, no rashes or lesions   Pysch: Normal mood and affect   Neurologic: Normal gross motor and sensory exam.         Labs  CBC:   Lab Results   Component Value Date    WBC 8.0 06/03/2012    RBC 5.05 03/16/2012    HGB 14.5 03/16/2012    HCT 43.9 03/16/2012    MCV 86.9 03/16/2012    RDW 14.3 03/16/2012    PLT 181 06/03/2012     CMP:    Lab Results   Component Value Date    NA 143 03/16/2012    K 3.9 03/16/2012    CL 107 03/16/2012    CO2 30 03/16/2012    BUN 15 03/16/2012    CREATININE 0.9 03/16/2012    CREATININE 0.8 05/15/2011    GFRAA >60 03/16/2012    AGRATIO 1.3 03/16/2012    GLUCOSE 85 03/16/2012    PROT 7.5 03/16/2012    PROT 7.3 03/31/2011    CALCIUM 9.9 03/16/2012    BILITOT 0.70 03/16/2012    ALKPHOS 95 03/16/2012    AST 23 03/16/2012    ALT 26 03/16/2012     PT/INR:    No components found with this basename: PTPATIENT,  PTINR     Lab Results   Component Value Date    TROPONINI <0.01 05/15/2011     No components found with this basename: CHLPL     Lab Results   Component Value Date  TRIG 89 03/16/2012    TRIG 132 03/31/2011    TRIG 126 03/26/2010     Lab Results   Component Value Date    HDL 62* 03/16/2012    HDL 52 03/31/2011    HDL 53 03/26/2010     Lab Results      Component Value Date    LDLCALC 115* 03/16/2012    LDLCALC 125* 03/31/2011    LDLCALC 141* 03/26/2010     Lab Results   Component Value Date    LABVLDL 18 03/16/2012    LABVLDL 26 03/31/2011    LABVLDL 25 03/26/2010     Lab Results   Component Value Date    ALT 26 03/16/2012    AST 23 03/16/2012    ALKPHOS 95 03/16/2012    BILITOT 0.70 03/16/2012       Assessment:  1. HIGH CHOLESTEROL    2. Hypertension    3. Pulmonary embolism        Plan: I talked to Mrs. Kelso at length regarding her symptoms. Due to her previous normal stress test but her concerning symptoms, I feel that a coronary angiogram would be appropriate. We discussed the risks, benefits and imponderables. We will apply a 24 hour Holter monitor today to assess her palpitations.       All questions and concerns were addressed to the patient/family. Alternatives to my treatment were discussed. The note was completed using EMR. Every effort was made to ensure accuracy; however, inadvertent computerized transcription errors may be present.    Wonda Cerise, MD,  [facility], [place]  [phone] Main central office  [phone] [facility] office  [phone] [facility] office  06/03/2012  8:52 AM

## 2012-06-03 NOTE — Discharge Instructions (Signed)
LEFT HEART CATHETERIZATION    Care of your puncture site:   Remove bandage 24 hours after the procedure.   May shower in 24 hours but do not sit in a bathtub/pool of water for 5 days or until the wound is healed.   Inspect the site daily and gently clean using soap and water while standing in the shower.   Dry thoroughly and apply a Band-Aid that covers the entire site. Do not apply powder or lotion.    Normal Observations:   Soreness or tenderness which may last one week.   Mild oozing from the incision site.   Possible bruising that could last 2 weeks.    Activity:   You may resume driving 24 hours following the procedure.   You may resume normal activity in 5 days or after the wound heals.   Avoid lifting more than 10 pounds for 5 days or until the wound heals.   Avoid strenuous exercise or activity for 1 week.      Nutrition:   Regular diet    Drink at least 8 to 10 glasses of decaffeinated, non-alcoholic fluid for the next 24 hours to flush the x-ray dye used for your angiogram out of your body.    Call your doctor immediately if your condition worsens, for any other concerns, for a follow-up appointment or if you experience any of the following:   Significant bleeding that does not stop after 10 minutes of applying firm pressure on the puncture site.   Increased swelling on the groin or leg.   Unusual pain, numbness, or tingling of the groin or down the leg.   Any signs of infection such as: redness, yellow drainage at the site, swelling or pain.    Other Instructions:   Hold Metformin or Metformin containing drugs for 48 hours after procedure.

## 2012-06-03 NOTE — Brief Op Note (Signed)
Patient:  Rachael Mays   DOB:   1956/04/12    A pre-sedation re-evaluation was performed immediately prior to beginning of  the procedure.  Procedure: Left Heart Catheterization  Medications: Procedural sedation with minimal conscious sedation  Complications: None  Estimated Blood Loss: Minimal  Specimens: Were not obtained    Cath:  Normal coronaries and LVEF    Plan:  1. Continue Holter monitor        Witt Medication and Procedural Reconciliation:  I agree that the documented medications and procedures performed are true.  The medications were given under my order.  The procedures were performed under my direct supervision.

## 2012-06-03 NOTE — Plan of Care (Signed)
H&P Update    I have reviewed the history and physical and examined the patient and find no relevant changes.   I have reviewed with the patient and/or family the risks, benefits, and alternatives to the procedure.    Pre-sedation Assessment    Patient:  Rachael Mays   DOB:   1956/11/11  Intended Procedure: Left heart Catheterization      Witt nurses notes reviewed and agreed.  Medications reviewed  Allergies:   Allergies   Allergen Reactions   ??? Talwin Rash         Pre-Procedure Assessment/Plan:  ASA 2 - Patient with mild systemic disease with no functional limitations    Level of Sedation Plan:Mild sedation    Post Procedure plan: Return to same level of care

## 2012-06-06 NOTE — Telephone Encounter (Signed)
Received refill request for amitiza but pt should have plenty Amitiza left. Does she need a short term supply?

## 2012-06-06 NOTE — Telephone Encounter (Signed)
Pt advised Rx is at [facility]

## 2012-06-13 NOTE — Telephone Encounter (Signed)
Pt wants to know if she should start back on the Neurontin?  Please advise

## 2012-06-13 NOTE — Telephone Encounter (Signed)
lmtc

## 2012-06-14 MED ORDER — GABAPENTIN 300 MG PO CAPS
300 MG | ORAL_CAPSULE | Freq: Every evening | ORAL | Status: DC
Start: 2012-06-14 — End: 2012-10-21

## 2012-06-14 NOTE — Telephone Encounter (Signed)
Pt states it was helping and wants to know if she can have an increase in strength

## 2012-06-14 NOTE — Telephone Encounter (Signed)
Neurontin 100mg  nightly x 3 days, then 300mg  nightly.

## 2012-06-14 NOTE — Telephone Encounter (Signed)
Spoke with pt, she said she is still having jaw pain.  She had stopped the neurotin and wants to  Know if its ok to go back on it.  Please advise.  Call pt on cell- [phone].  Uses [facility] in [place]

## 2012-06-14 NOTE — Telephone Encounter (Signed)
Didn't think this helped with jaw pain in past so wouldn't restart it.

## 2012-06-17 NOTE — Addendum Note (Signed)
Addended by: Dion Saucier on: 06/17/2012 08:13 AM     Modules accepted: Orders

## 2012-06-24 NOTE — Progress Notes (Signed)
[name]     Outpatient Follow Up Note    Subjective:   CHIEF COMPLAINT / HPI: Hospital Follow Up secondary to undergoing a cardiac cath on June 28th    Hospital record has been reviewed  Hospital Course progressed as follows: elective procedure for complaints of jaw pain and dizziness. Her cath showed essentially normal epicardial arteries. She had worn a Holter monitor: NSR, brief episodes of SVT longest at 4 bts.      Rachael Mays is 56 y.o. female who presents today for a routine follow up after a recent hospitalization related to the above mentioned issues.  Since the time of discharge, the patient admits their symptoms have improved. She continues to have "bouts" when her jaw hurts. It has lessened in frequency from experiencing daily to 3-4 x / week. She denies chest discomfort/heaviness.      There is no SOB/DOE. The patient has occasional palpitations which makes her feel drained/tired. The frequency has decreased from a regular pattern (qod) to couple times in the past few weeks.   These symptoms are improving over the last few weeks.   With regard to medication therapy the patient has been compliant with prescribed regimen. They have tolerated therapy to date.     Past Medical History   Diagnosis Date   ??? Chronic back pain    ??? Deep vein thrombosis    ??? Depression    ??? GERD (gastroesophageal reflux disease)      NO LONGER SINCE LAP   ??? Pulmonary embolism    ??? Obesity      hx of; had lap band   ??? Hypertension      no problems since lap SURGERY   ??? Hyperlipidemia      hx; resolved with lap band     Social History:    History   Smoking status   ??? Current Everyday Smoker -- 0.50 packs/day for 35 years   ??? Types: Cigarettes   Smokeless tobacco   ??? Never Used   Comment: Less than a half a PPD     Current Medications:  Current Outpatient Prescriptions   Medication Sig Dispense Refill   ??? escitalopram (LEXAPRO) 20 MG tablet Take 20 mg by mouth daily.       ??? gabapentin (NEURONTIN) 300 MG  capsule Take 1 capsule by mouth nightly.  30 capsule  3   ??? varenicline (CHANTIX STARTING MONTH PAK) 0.5 MG X 11 & 1 MG X 42 tablet Take by mouth.  53 tablet  0   ??? varenicline (CHANTIX CONTINUING MONTH PAK) 1 MG tablet Take 1 tablet by mouth 2 times daily.  60 tablet  3   ??? lubiprostone (AMITIZA) 24 MCG capsule Take 1 capsule by mouth 2 times daily (with meals).  180 capsule  1   ??? cyclobenzaprine (FLEXERIL) 10 MG tablet Take 10 mg by mouth 3 times daily as needed.       ??? hydrochlorothiazide (HYDRODIURIL) 25 MG tablet Take 1 tablet by mouth daily.  90 tablet  3   ??? ALPRAZolam (XANAX) 0.5 MG tablet Take 1 tablet by mouth 3 times daily as needed for Sleep or Anxiety.  30 tablet  0   ??? calcium carbonate 600 MG TABS tablet Take 1 tablet by mouth 2 times daily.         ??? Vitamin D (CHOLECALCIFEROL) 1000 UNITS CAPS capsule Take 1,000 Units by mouth 2 times daily.         ???   Multiple Vitamin (MULTIVITAMIN PO) Take 1 tablet by mouth 2 times daily.       ??? albuterol (PROVENTIL;VENTOLIN) 90 MCG/ACT inhaler Inhale 2 puffs into the lungs every 6 hours as needed.         No current facility-administered medications for this visit.     REVIEW OF SYSTEMS:   CONSTITUTIONAL: No major weight gain or loss, fatigue, weakness, night sweats or fever. There's been no change in energy level, sleep pattern, or activity level.     HEENT: No new vision difficulties or ringing in the ears.  RESPIRATORY: No new SOB, PND, orthopnea or cough.   CARDIOVASCULAR: See HPI  GI: No nausea, vomiting, diarrhea, constipation, abdominal pain or changes in bowel habits.  GU: No urinary frequency, urgency, incontinence hematuria or dysuria.  SKIN: No cyanosis or skin lesions.  MUSCULOSKELETAL: No new muscle or joint pain.  NEUROLOGICAL: No syncope or TIA-like symptoms.  PSYCHIATRIC: No anxiety, pain, insomnia or depression    Objective:   PHYSICAL EXAM:    Filed Vitals:    06/24/12 0916 06/24/12 0941   BP: 130/62 132/62   Pulse: 60    Height: 5' 6" (1.676  m)    Weight: 212 lb (96.163 kg)    SpO2: 98%          VITALS:  BP 130/62   Pulse 60   Ht 5' 6" (1.676 m)   Wt 212 lb (96.163 kg)   BMI 34.23 kg/m2   SpO2 98%    CONSTITUTIONAL: Cooperative, no apparent distress, and appears well nourished / developed  NEUROLOGIC:  Awake and orientated to person, place and time.  PSYCH: Calm affect.  SKIN: Warm and dry.  HEENT: Sclera non-icteric, normocephalic, neck supple, no elevation of JVP, normal carotid pulses with no bruits and thyroid normal size.  LUNGS:  No increased work of breathing and clear to auscultation, no crackles or wheezing.  CARDIOVASCULAR:  Regular rate 60 and rhythm with no murmurs, gallops, rubs, or abnormal heart sounds, normal PMI. The apical impulses not displaced.                               Heart tones are crisp and normal                                                                                            Cervical veins are not engorged                 JVP less than 8 cm H2O                                                                              The carotid upstroke is normal in amplitude and contour without delay or bruit    ABDOMEN:    Normal bowel sounds, non-distended and non-tender to palpation   EXT: No edema, no calf tenderness. Pulses are present bilaterally.    DATA:    Lab Results   Component Value Date    ALT 26 03/16/2012    AST 23 03/16/2012    ALKPHOS 95 03/16/2012    BILITOT 0.70 03/16/2012     Lab Results   Component Value Date    CREATININE 0.9 03/16/2012    BUN 15 03/16/2012    NA 143 03/16/2012    K 3.9 03/16/2012    CL 107 03/16/2012    CO2 30 03/16/2012     Lab Results   Component Value Date    TSH 1.42 03/31/2011     Lab Results   Component Value Date    WBC 8.0 06/03/2012    HGB 14.5 03/16/2012    HCT 43.9 03/16/2012    MCV 86.9 03/16/2012    PLT 181 06/03/2012     No components found with this basename: CHLPL     Lab Results   Component Value Date    TRIG 89 03/16/2012    TRIG 132 03/31/2011    TRIG 126 03/26/2010     Lab Results      Component Value Date    HDL 62* 03/16/2012    HDL 52 03/31/2011    HDL 53 03/26/2010     Lab Results   Component Value Date    LDLCALC 115* 03/16/2012    LDLCALC 125* 03/31/2011    LDLCALC 141* 03/26/2010     Lab Results   Component Value Date    LABVLDL 18 03/16/2012    LABVLDL 26 03/31/2011    LABVLDL 25 03/26/2010     Radiology Review:  Pertinent images / reports were reviewed as a part of this visit and reveals the following:    Last Echo: Nov '10:  Right Ventricular Dimension-  Left Ventricular Dimension- 4.9/1.9  Posterior Wall Thickness- 1.3  Septal Wall Thickness- 1.3  Aortic Root- 3.3  Aortic Cusps Separation- 2.3  Left Atrial Dimension- 3.7    FINDINGS- This is a technically adequate 2-dimensional  echocardiogram with Doppler. Left ventricular size is normal. There  may be mild left ventricular hypertrophy. Left ventricular function  is normal. Ejection fraction is in the 60% range. Mitral valve is  normal. Left atrium is normal. Aortic valve is sclerotic. Aortic  root size is normal. Right ventricular size and function is normal.  Right atrium is normal. Tricuspid and pulmonic valves are  structurally normal. There is no pericardial effusion. Doppler study  fails to reveal significant valvular insufficiency.    IMPRESSION- Normal left ventricular function. Borderline left  ventricular hypertrophy.    Last Angiogram: 06/03/12:  Cath:   Normal coronaries and LVEF   Plan:   1. Continue Holter monitor      Assessment:     1. Other chest pain   *improved  *essentially normal epicardial arteries by cath   2. Palpitations   *improved: cannot identify food triggers  *Holter: NSR , short runs atrial tachy at which time did not correlate with her symptoms of dizziness and jaw pain   3. Essential hypertension, benign   *controlled   4. Other and unspecified hyperlipidemia   *HDL high; LDL reasonable   5. Tobacco abuse   *suboptimal: using Chantix       Patient  is stable since hospital discharge.    Plan: CPM   F/U in six  months; if   palpitations worsen in freq/duration she will call and come in for an EKG     I have addressed the patient's cardiac risk factors and adjusted pharmacologic treatment as needed. In addition, I have reinforced the need for patient directed risk factor modification.    Further evaluation will be based upon the patient's clinical course and testing results.    All questions and concerns were addressed to the patient. Alternatives to  treatment were discussed.     The patient  currently  is smoking. The risks related to smoking were reviewed with the patient. Recommend maintaining a smoke-free lifestyle. Products available for smoking cessation are being used: Chantix starter pack.    Dual Antiplatelet therapy has not been recommended / prescribed for this patient.     diet discussed: 6 small meals day d/t lap band procedure '10  Exercise program discussed: work related as a cook: [employer]'    Thank you for allowing to us to participate in the care of Gurbani S Lupinski.      [facility]

## 2012-06-24 NOTE — Communication Body (Signed)
[facility]     Outpatient Follow Up Note    Subjective:   CHIEF COMPLAINT / HPI: Hospital Follow Up secondary to undergoing a cardiac cath on June 28th    Hospital record has been reviewed  Hospital Course progressed as follows: elective procedure for complaints of jaw pain and dizziness. Her cath showed essentially normal epicardial arteries. She had worn a Holter monitor: NSR, brief episodes of SVT longest at 4 bts.      Rachael Mays is 56 y.o. female who presents today for a routine follow up after a recent hospitalization related to the above mentioned issues.  Since the time of discharge, the patient admits their symptoms have improved. She continues to have "bouts" when her jaw hurts. It has lessened in frequency from experiencing daily to 3-4 x / week. She denies chest discomfort/heaviness.      There is no SOB/DOE. The patient has occasional palpitations which makes her feel drained/tired. The frequency has decreased from a regular pattern (qod) to couple times in the past few weeks.   These symptoms are improving over the last few weeks.   With regard to medication therapy the patient has been compliant with prescribed regimen. They have tolerated therapy to date.     Past Medical History   Diagnosis Date   ??? Chronic back pain    ??? Deep vein thrombosis    ??? Depression    ??? GERD (gastroesophageal reflux disease)      NO LONGER SINCE LAP   ??? Pulmonary embolism    ??? Obesity      hx of; had lap band   ??? Hypertension      no problems since lap SURGERY   ??? Hyperlipidemia      hx; resolved with lap band     Social History:    History   Smoking status   ??? Current Everyday Smoker -- 0.50 packs/day for 35 years   ??? Types: Cigarettes   Smokeless tobacco   ??? Never Used   Comment: Less than a half a PPD     Current Medications:  Current Outpatient Prescriptions   Medication Sig Dispense Refill   ??? escitalopram (LEXAPRO) 20 MG tablet Take 20 mg by mouth daily.       ??? gabapentin (NEURONTIN) 300 MG  capsule Take 1 capsule by mouth nightly.  30 capsule  3   ??? varenicline (CHANTIX STARTING MONTH PAK) 0.5 MG X 11 & 1 MG X 42 tablet Take by mouth.  53 tablet  0   ??? varenicline (CHANTIX CONTINUING MONTH PAK) 1 MG tablet Take 1 tablet by mouth 2 times daily.  60 tablet  3   ??? lubiprostone (AMITIZA) 24 MCG capsule Take 1 capsule by mouth 2 times daily (with meals).  180 capsule  1   ??? cyclobenzaprine (FLEXERIL) 10 MG tablet Take 10 mg by mouth 3 times daily as needed.       ??? hydrochlorothiazide (HYDRODIURIL) 25 MG tablet Take 1 tablet by mouth daily.  90 tablet  3   ??? ALPRAZolam (XANAX) 0.5 MG tablet Take 1 tablet by mouth 3 times daily as needed for Sleep or Anxiety.  30 tablet  0   ??? calcium carbonate 600 MG TABS tablet Take 1 tablet by mouth 2 times daily.         ??? Vitamin D (CHOLECALCIFEROL) 1000 UNITS CAPS capsule Take 1,000 Units by mouth 2 times daily.         ???  Multiple Vitamin (MULTIVITAMIN PO) Take 1 tablet by mouth 2 times daily.       ??? albuterol (PROVENTIL;VENTOLIN) 90 MCG/ACT inhaler Inhale 2 puffs into the lungs every 6 hours as needed.         No current facility-administered medications for this visit.     REVIEW OF SYSTEMS:   CONSTITUTIONAL: No major weight gain or loss, fatigue, weakness, night sweats or fever. There's been no change in energy level, sleep pattern, or activity level.     HEENT: No new vision difficulties or ringing in the ears.  RESPIRATORY: No new SOB, PND, orthopnea or cough.   CARDIOVASCULAR: See HPI  GI: No nausea, vomiting, diarrhea, constipation, abdominal pain or changes in bowel habits.  GU: No urinary frequency, urgency, incontinence hematuria or dysuria.  SKIN: No cyanosis or skin lesions.  MUSCULOSKELETAL: No new muscle or joint pain.  NEUROLOGICAL: No syncope or TIA-like symptoms.  PSYCHIATRIC: No anxiety, pain, insomnia or depression    Objective:   PHYSICAL EXAM:    Filed Vitals:    06/24/12 0916 06/24/12 0941   BP: 130/62 132/62   Pulse: 60    Height: 5\' 6"  (1.676  m)    Weight: 212 lb (96.163 kg)    SpO2: 98%          VITALS:  BP 130/62   Pulse 60   Ht 5\' 6"  (1.676 m)   Wt 212 lb (96.163 kg)   BMI 34.23 kg/m2   SpO2 98%    CONSTITUTIONAL: Cooperative, no apparent distress, and appears well nourished / developed  NEUROLOGIC:  Awake and orientated to person, place and time.  PSYCH: Calm affect.  SKIN: Warm and dry.  HEENT: Sclera non-icteric, normocephalic, neck supple, no elevation of JVP, normal carotid pulses with no bruits and thyroid normal size.  LUNGS:  No increased work of breathing and clear to auscultation, no crackles or wheezing.  CARDIOVASCULAR:  Regular rate 60 and rhythm with no murmurs, gallops, rubs, or abnormal heart sounds, normal PMI. The apical impulses not displaced.                               Heart tones are crisp and normal                                                                                            Cervical veins are not engorged                 JVP less than 8 cm H2O                                                                              The carotid upstroke is normal in amplitude and contour without delay or bruit    ABDOMEN:  Normal bowel sounds, non-distended and non-tender to palpation   EXT: No edema, no calf tenderness. Pulses are present bilaterally.    DATA:    Lab Results   Component Value Date    ALT 26 03/16/2012    AST 23 03/16/2012    ALKPHOS 95 03/16/2012    BILITOT 0.70 03/16/2012     Lab Results   Component Value Date    CREATININE 0.9 03/16/2012    BUN 15 03/16/2012    NA 143 03/16/2012    K 3.9 03/16/2012    CL 107 03/16/2012    CO2 30 03/16/2012     Lab Results   Component Value Date    TSH 1.42 03/31/2011     Lab Results   Component Value Date    WBC 8.0 06/03/2012    HGB 14.5 03/16/2012    HCT 43.9 03/16/2012    MCV 86.9 03/16/2012    PLT 181 06/03/2012     No components found with this basename: CHLPL     Lab Results   Component Value Date    TRIG 89 03/16/2012    TRIG 132 03/31/2011    TRIG 126 03/26/2010     Lab Results      Component Value Date    HDL 62* 03/16/2012    HDL 52 03/31/2011    HDL 53 03/26/2010     Lab Results   Component Value Date    LDLCALC 115* 03/16/2012    LDLCALC 125* 03/31/2011    LDLCALC 141* 03/26/2010     Lab Results   Component Value Date    LABVLDL 18 03/16/2012    LABVLDL 26 03/31/2011    LABVLDL 25 03/26/2010     Radiology Review:  Pertinent images / reports were reviewed as a part of this visit and reveals the following:    Last Echo: Nov '10:  Right Ventricular Dimension-  Left Ventricular Dimension- 4.9/1.9  Posterior Wall Thickness- 1.3  Septal Wall Thickness- 1.3  Aortic Root- 3.3  Aortic Cusps Separation- 2.3  Left Atrial Dimension- 3.7    FINDINGS- This is a technically adequate 2-dimensional  echocardiogram with Doppler. Left ventricular size is normal. There  may be mild left ventricular hypertrophy. Left ventricular function  is normal. Ejection fraction is in the 60% range. Mitral valve is  normal. Left atrium is normal. Aortic valve is sclerotic. Aortic  root size is normal. Right ventricular size and function is normal.  Right atrium is normal. Tricuspid and pulmonic valves are  structurally normal. There is no pericardial effusion. Doppler study  fails to reveal significant valvular insufficiency.    IMPRESSION- Normal left ventricular function. Borderline left  ventricular hypertrophy.    Last Angiogram: 06/03/12:  Cath:   Normal coronaries and LVEF   Plan:   1. Continue Holter monitor      Assessment:     1. Other chest pain   *improved  *essentially normal epicardial arteries by cath   2. Palpitations   *improved: cannot identify food triggers  *Holter: NSR , short runs atrial tachy at which time did not correlate with her symptoms of dizziness and jaw pain   3. Essential hypertension, benign   *controlled   4. Other and unspecified hyperlipidemia   *HDL high; LDL reasonable   5. Tobacco abuse   *suboptimal: using Chantix       Patient  is stable since hospital discharge.    Plan: CPM   F/U in six  months; if  palpitations worsen in freq/duration she will call and come in for an EKG     I have addressed the patient's cardiac risk factors and adjusted pharmacologic treatment as needed. In addition, I have reinforced the need for patient directed risk factor modification.    Further evaluation will be based upon the patient's clinical course and testing results.    All questions and concerns were addressed to the patient. Alternatives to  treatment were discussed.     The patient  currently  is smoking. The risks related to smoking were reviewed with the patient. Recommend maintaining a smoke-free lifestyle. Products available for smoking cessation are being used: Chantix starter pack.    Dual Antiplatelet therapy has not been recommended / prescribed for this patient.     diet discussed: 6 small meals day d/t lap band procedure '10  Exercise program discussed: work related as a cook: [employer]'    Thank you for allowing to [place] to participate in the care of DELVINA MIZZELL.      [employer]

## 2012-09-20 MED ORDER — ALPRAZOLAM 0.5 MG PO TABS
0.5 MG | ORAL_TABLET | Freq: Two times a day (BID) | ORAL | Status: DC | PRN
Start: 2012-09-20 — End: 2012-10-20

## 2012-10-20 LAB — POC URINE WITH MICROSCOPIC
Bilirubin Urine: 0 mg/dL
Blood, Urine: POSITIVE
Glucose, Ur: NEGATIVE
Ketones, Urine: NEGATIVE
Leukocytes, UA: NEGATIVE
Nitrite, Urine: NEGATIVE
Protein, UA: POSITIVE — AB
Specific Gravity, UA: 1.025 (ref 1.005–1.030)
Urobilinogen, Urine: NORMAL
pH, UA: 6.5 (ref 4.5–8.0)

## 2012-10-20 MED ORDER — ALPRAZOLAM 0.5 MG PO TABS
0.5 MG | ORAL_TABLET | Freq: Two times a day (BID) | ORAL | Status: DC | PRN
Start: 2012-10-20 — End: 2013-08-22

## 2012-10-20 NOTE — Progress Notes (Signed)
Patient is here for dizziness, shakiness, headache, and right upper abdominal pain times 3 days.

## 2012-10-20 NOTE — Progress Notes (Signed)
Subjective:      Patient ID: Rachael Mays is a 57 y.o. female.    HPI  3 day ago felt drained.  Experienced right flank pain at that time and was dizzy and had trouble communicating.  Felt like she might pass out.  Took a xanax and that "took the edge off".  Yesterday evening felt similar symptoms.  BP normal and blood sugar 124 per patient.  Still feeling shaky at times and fatigued.    Review of Systems    Objective:   Physical Exam   Constitutional: She is oriented to person, place, and time. She appears well-developed and well-nourished. No distress.   Cardiovascular: Normal rate, regular rhythm and normal heart sounds.    No murmur heard.  Pulmonary/Chest: Effort normal and breath sounds normal. She has no wheezes. She has no rales.   Abdominal: Bowel sounds are normal. She exhibits no mass. There is no tenderness. There is no guarding.   Neurological: She is alert and oriented to person, place, and time.   Skin: Skin is warm and dry.   Psychiatric: She has a normal mood and affect. Her behavior is normal.       Assessment:     1. Fever         Plan:   Uncertain etiology of symptoms.  Only abnormal finding is slightly elevated temperature.  Check cmp, cbc with diff, TSH, blood cultures x 2, urine culture.  To ER with worsening symtoms.

## 2012-10-21 LAB — CBC WITH AUTO DIFFERENTIAL
Basophils %: 0.6 %
Basophils Absolute: 0 10*3/uL (ref 0.0–0.2)
Eosinophils %: 2.4 %
Eosinophils Absolute: 0.2 10*3/uL (ref 0.0–0.6)
Hematocrit: 43.6 % (ref 36.0–48.0)
Hemoglobin: 14.2 g/dL (ref 12.0–16.0)
Lymphocytes %: 31.6 %
Lymphocytes Absolute: 2.4 10*3/uL (ref 1.0–5.1)
MCH: 28.5 pg (ref 26.0–34.0)
MCHC: 32.5 g/dL (ref 31.0–36.0)
MCV: 87.5 fL (ref 80.0–100.0)
MPV: 8.9 fL (ref 5.0–10.5)
Monocytes %: 8 %
Monocytes Absolute: 0.6 10*3/uL (ref 0.0–1.3)
Neutrophils %: 57.4 %
Neutrophils Absolute: 4.4 10*3/uL (ref 1.7–7.7)
Platelets: 216 10*3/uL (ref 135–450)
RBC: 4.98 M/uL (ref 4.00–5.20)
RDW: 13.5 % (ref 12.4–15.4)
WBC: 7.8 10*3/uL (ref 4.0–11.0)

## 2012-10-21 LAB — COMPREHENSIVE METABOLIC PANEL
ALT: 27 U/L (ref 10–40)
AST: 32 U/L (ref 15–37)
Albumin/Globulin Ratio: 1.3 (ref 1.1–2.2)
Albumin: 4.4 g/dL (ref 3.4–5.0)
Alkaline Phosphatase: 92 U/L (ref 45–129)
BUN: 15 mg/dL (ref 7–18)
CO2: 29 mEq/L (ref 21–32)
Calcium: 10.3 mg/dL (ref 8.3–10.6)
Chloride: 106 mEq/L (ref 99–110)
Creatinine: 0.9 mg/dL (ref 0.6–1.1)
GFR African American: >60 (ref >60)
GFR Non-African American: >60 (ref >60)
Globulin: 3 g/dL
Glucose: 106 mg/dL — ABNORMAL HIGH (ref 70–99)
Potassium: 3.9 mEq/L (ref 3.5–5.1)
Sodium: 142 mEq/L (ref 136–145)
Total Bilirubin: 0.6 mg/dL (ref 0.00–1.00)
Total Protein: 7.7 g/dL (ref 6.4–8.2)

## 2012-10-21 LAB — TSH,NO REFLEX: TSH: 1.36 u[IU]/mL (ref 0.27–4.20)

## 2012-10-21 MED ORDER — GABAPENTIN 300 MG PO CAPS
300 MG | ORAL_CAPSULE | Freq: Every evening | ORAL | Status: DC
Start: 2012-10-21 — End: 2013-05-16

## 2012-10-21 NOTE — Telephone Encounter (Signed)
Pt said that TS was supposed to call this in for her yesterday when she was in the office.  She is requesting for 90 day supply - this is cheaper on her.

## 2012-12-15 MED ORDER — METHYLPREDNISOLONE ACETATE 80 MG/ML IJ SUSP
80 | Freq: Once | INTRAMUSCULAR | Status: AC
Start: 2012-12-15 — End: 2012-12-15
  Administered 2012-12-15: 21:00:00 via INTRAMUSCULAR

## 2012-12-15 NOTE — Progress Notes (Signed)
Subjective:      Patient ID: Rachael Mays is a 57 y.o. female.    HPI  Patient c/o productive cough, nasal congestion, feels short breath, dizzy feeling on and off.  Possible fever, but never checked.  Symptoms started about 3 months ago. She feels like it is stuck in her neck. She feels SOB at times. Her cough is dry and nasal drainage is clear. No n/v/d. She has taken advil cold/sinus, sudafed.  Review of Systems   Constitutional: Positive for fever (??) and fatigue. Negative for chills and appetite change.   HENT: Positive for congestion. Negative for ear pain, sore throat and neck pain.    Eyes: Negative for pain.   Respiratory: Positive for cough and shortness of breath. Negative for wheezing.    Gastrointestinal: Positive for diarrhea (resolved). Negative for nausea and vomiting.   Skin: Negative for rash.   Neurological: Positive for dizziness and headaches. Negative for weakness.       Objective:   Physical Exam   Constitutional: She is oriented to person, place, and time. Vital signs are normal. She appears well-developed and well-nourished. She is cooperative.   HENT:   Right Ear: Tympanic membrane and ear canal normal.   Left Ear: Tympanic membrane and ear canal normal.   Nose: Mucosal edema present. Right sinus exhibits no maxillary sinus tenderness and no frontal sinus tenderness. Left sinus exhibits no maxillary sinus tenderness and no frontal sinus tenderness.   Mouth/Throat: Oropharynx is clear and moist and mucous membranes are normal.   Neck: Neck supple.   Cardiovascular: Normal rate, regular rhythm and normal heart sounds.    Pulmonary/Chest: Effort normal and breath sounds normal. No respiratory distress. She has no decreased breath sounds.   Lymphadenopathy:     She has no cervical adenopathy.   Neurological: She is alert and oriented to person, place, and time.       Assessment:      Rachael Mays was seen today for uri.    Diagnoses and associated orders for this visit:    Allergic  rhinitis  - methylPREDNISolone acetate (DEPO-MEDROL) injection 80 mg; Inject 1 mL into the muscle once.               Plan:      Call if not improving. Take zyrtec daily.

## 2012-12-15 NOTE — Progress Notes (Signed)
I have reviewed the history and physical note and findings.

## 2012-12-15 NOTE — Patient Instructions (Addendum)
Rachael Mays was seen today for uri.    Diagnoses and associated orders for this visit:    Allergic rhinitis  - methylPREDNISolone acetate (DEPO-MEDROL) injection 80 mg; Inject 1 mL into the muscle once.         Call if not improving. Try zyrtec daily for at least a week.

## 2012-12-19 NOTE — Telephone Encounter (Signed)
Yes, fine.  Forgot to add to list.

## 2012-12-19 NOTE — Telephone Encounter (Signed)
This patient states that you ok'd her friend, Ned GraceBeverly Moore, as a NP???  Is that correct??  Did she say what insurance she has; [name] did not know.

## 2012-12-20 NOTE — Telephone Encounter (Signed)
Left Message to call back

## 2012-12-20 NOTE — Telephone Encounter (Signed)
Notified "ok" to take Ned GraceBeverly Moore as a NP.

## 2012-12-22 ENCOUNTER — Encounter

## 2012-12-22 MED ORDER — HYDROCHLOROTHIAZIDE 25 MG PO TABS
25 MG | ORAL_TABLET | Freq: Every day | ORAL | Status: DC
Start: 2012-12-22 — End: 2013-06-07

## 2012-12-28 NOTE — Progress Notes (Signed)
[facility]     Outpatient Follow Up Note    Subjective:   CHIEF COMPLAINT / HPI:Dizziness, palpitations      Hospital Course progressed as follows: 05/2012 elective procedure for complaints of jaw pain and dizziness. Her cath showed essentially normal epicardial arteries. She had worn a Holter monitor: NSR, brief episodes of SVT longest at 4 bts.      Rachael Mays is 57 y.o. female who presents today for complaints of dizziness.     There is no SOB/DOE. The patient has occasional palpitations which makes her feel drained/tired to the point where she feels that she has to sit down. She has also experienced dizziness. She has not smoked for 5 months with the help of Chantix. She has gained some weight since but is planning on exercising more.         Past Medical History   Diagnosis Date   ??? Chronic back pain    ??? Deep vein thrombosis    ??? Depression    ??? GERD (gastroesophageal reflux disease)      NO LONGER SINCE LAP   ??? Pulmonary embolism    ??? Obesity      hx of; had lap band   ??? Hypertension      no problems since lap SURGERY   ??? Hyperlipidemia      hx; resolved with lap band     Social History:    History   Smoking status   ??? Former Smoker -- 0.50 packs/day for 35 years   ??? Types: Cigarettes   ??? Quit date: 08/07/2012   Smokeless tobacco   ??? Never Used     Comment: Less than a half a PPD     Current Medications:  Current Outpatient Prescriptions   Medication Sig Dispense Refill   ??? hydrochlorothiazide (HYDRODIURIL) 25 MG tablet Take 1 tablet by mouth daily.  90 tablet  1   ??? gabapentin (NEURONTIN) 300 MG capsule Take 1 capsule by mouth nightly.  90 capsule  1   ??? ALPRAZolam (XANAX) 0.5 MG tablet Take 1 tablet by mouth 2 times daily as needed for Sleep or Anxiety.  60 tablet  0   ??? escitalopram (LEXAPRO) 20 MG tablet Take 20 mg by mouth daily.       ??? calcium carbonate 600 MG TABS tablet Take 1 tablet by mouth 2 times daily.         ??? Vitamin D (CHOLECALCIFEROL) 1000 UNITS CAPS capsule Take 1,000  Units by mouth 2 times daily.         ??? Multiple Vitamin (MULTIVITAMIN PO) Take 1 tablet by mouth 2 times daily.       ??? albuterol (PROVENTIL;VENTOLIN) 90 MCG/ACT inhaler Inhale 2 puffs into the lungs every 6 hours as needed.         No current facility-administered medications for this visit.     REVIEW OF SYSTEMS:   CONSTITUTIONAL: No major weight gain or loss, fatigue, weakness, night sweats or fever. There's been no change in energy level, sleep pattern, or activity level.     HEENT: No new vision difficulties or ringing in the ears.  RESPIRATORY: No new SOB, PND, orthopnea or cough.   CARDIOVASCULAR: See HPI  GI: No nausea, vomiting, diarrhea, constipation, abdominal pain or changes in bowel habits.  GU: No urinary frequency, urgency, incontinence hematuria or dysuria.  SKIN: No cyanosis or skin lesions.  MUSCULOSKELETAL: No new muscle or joint pain.  NEUROLOGICAL: No   syncope or TIA-like symptoms.  PSYCHIATRIC: No anxiety, pain, insomnia or depression    Objective:   PHYSICAL EXAM:    Filed Vitals:    12/28/12 1306   BP: 158/94   Pulse: 62   Height: 5' 6" (1.676 m)   Weight: 217 lb (98.431 kg)         VITALS:  BP 158/94   Pulse 62   Ht 5' 6" (1.676 m)   Wt 217 lb (98.431 kg)   BMI 35.04 kg/m2    CONSTITUTIONAL: Cooperative, no apparent distress, and appears well nourished / developed  NEUROLOGIC:  Awake and orientated to person, place and time.  PSYCH: Calm affect.  SKIN: Warm and dry.  HEENT: Sclera non-icteric, normocephalic, neck supple, no elevation of JVP, normal carotid pulses with no bruits and thyroid normal size.  LUNGS:  No increased work of breathing and clear to auscultation, no crackles or wheezing.  CARDIOVASCULAR:  Regular rate 60 and rhythm with no murmurs, gallops, rubs, or abnormal heart sounds, normal PMI. The apical impulses not displaced.                               Heart tones are crisp and normal                                                                                               Cervical veins are not engorged                 JVP less than 8 cm H2O                                                                              The carotid upstroke is normal in amplitude and contour without delay or bruit    ABDOMEN:  Normal bowel sounds, non-distended and non-tender to palpation   EXT: No edema, no calf tenderness. Pulses are present bilaterally.    DATA:    Lab Results   Component Value Date    ALT 27 10/20/2012    AST 32 10/20/2012    ALKPHOS 92 10/20/2012    BILITOT 0.60 10/20/2012     Lab Results   Component Value Date    CREATININE 0.9 10/20/2012    BUN 15 10/20/2012    NA 142 10/20/2012    K 3.9 10/20/2012    CL 106 10/20/2012    CO2 29 10/20/2012     Lab Results   Component Value Date    TSH 1.36 10/20/2012     Lab Results   Component Value Date    WBC 7.8 10/20/2012    HGB 14.2 10/20/2012    HCT 43.6 10/20/2012    MCV 87.5 10/20/2012    PLT 216 10/20/2012       No components found with this basename: CHLPL     Lab Results   Component Value Date    TRIG 89 03/16/2012    TRIG 132 03/31/2011    TRIG 126 03/26/2010     Lab Results   Component Value Date    HDL 62* 03/16/2012    HDL 52 03/31/2011    HDL 53 03/26/2010     Lab Results   Component Value Date    LDLCALC 115* 03/16/2012    LDLCALC 125* 03/31/2011    LDLCALC 141* 03/26/2010     Lab Results   Component Value Date    LABVLDL 18 03/16/2012    LABVLDL 26 03/31/2011    LABVLDL 25 03/26/2010     Radiology Review:  Pertinent images / reports were reviewed as a part of this visit and reveals the following:    Last Echo: Nov '10:  Right Ventricular Dimension-  Left Ventricular Dimension- 4.9/1.9  Posterior Wall Thickness- 1.3  Septal Wall Thickness- 1.3  Aortic Root- 3.3  Aortic Cusps Separation- 2.3  Left Atrial Dimension- 3.7    FINDINGS- This is a technically adequate 2-dimensional  echocardiogram with Doppler. Left ventricular size is normal. There  may be mild left ventricular hypertrophy. Left ventricular function  is normal. Ejection fraction  is in the 60% range. Mitral valve is  normal. Left atrium is normal. Aortic valve is sclerotic. Aortic  root size is normal. Right ventricular size and function is normal.  Right atrium is normal. Tricuspid and pulmonic valves are  structurally normal. There is no pericardial effusion. Doppler study  fails to reveal significant valvular insufficiency.    IMPRESSION- Normal left ventricular function. Borderline left  ventricular hypertrophy.    Last Angiogram: 06/03/12:  Cath:   Normal coronaries and LVEF   Plan:   1. Continue Holter monitor      Assessment:     1. Other chest pain   *improved  *essentially normal epicardial arteries by cath   2. Palpitations   *Holter: NSR , short runs atrial tachy at which time did not correlate with her symptoms of dizziness and jaw pain. Will have her 30 day event recorder.   3. Essential hypertension, benign   High today. She states that it is well controlled at home. Will continue to monitor.   4. Other and unspecified hyperlipidemia   *HDL high; LDL reasonable   5. Tobacco abuse   Tobacco free for 5 months.   6. Dizziness: 30 day event recorder.    .    Plan: 30 day event recorder and will follow up in 6 weeks.     Thank you for allowing to us to participate in the care of Rachael Mays.      [facility]

## 2012-12-28 NOTE — Communication Body (Signed)
[facility]     Outpatient Follow Up Note    Subjective:   CHIEF COMPLAINT / WUJ:[id number], palpitations      Hospital Course progressed as follows: 05/2012 elective procedure for complaints of jaw pain and dizziness. Her cath showed essentially normal epicardial arteries. She had worn a Holter monitor: NSR, brief episodes of SVT longest at 4 bts.      Rachael Mays is 57 y.o. female who presents today for complaints of dizziness.     There is no SOB/DOE. The patient has occasional palpitations which makes her feel drained/tired to the point where she feels that she has to sit down. She has also experienced dizziness. She has not smoked for 5 months with the help of Chantix. She has gained some weight since but is planning on exercising more.         Past Medical History   Diagnosis Date   ??? Chronic back pain    ??? Deep vein thrombosis    ??? Depression    ??? GERD (gastroesophageal reflux disease)      NO LONGER SINCE LAP   ??? Pulmonary embolism    ??? Obesity      hx of; had lap band   ??? Hypertension      no problems since lap SURGERY   ??? Hyperlipidemia      hx; resolved with lap band     Social History:    History   Smoking status   ??? Former Smoker -- 0.50 packs/day for 35 years   ??? Types: Cigarettes   ??? Quit date: 08/07/2012   Smokeless tobacco   ??? Never Used     Comment: Less than a half a PPD     Current Medications:  Current Outpatient Prescriptions   Medication Sig Dispense Refill   ??? hydrochlorothiazide (HYDRODIURIL) 25 MG tablet Take 1 tablet by mouth daily.  90 tablet  1   ??? gabapentin (NEURONTIN) 300 MG capsule Take 1 capsule by mouth nightly.  90 capsule  1   ??? ALPRAZolam (XANAX) 0.5 MG tablet Take 1 tablet by mouth 2 times daily as needed for Sleep or Anxiety.  60 tablet  0   ??? escitalopram (LEXAPRO) 20 MG tablet Take 20 mg by mouth daily.       ??? calcium carbonate 600 MG TABS tablet Take 1 tablet by mouth 2 times daily.         ??? Vitamin D (CHOLECALCIFEROL) 1000 UNITS CAPS capsule Take 1,000  Units by mouth 2 times daily.         ??? Multiple Vitamin (MULTIVITAMIN PO) Take 1 tablet by mouth 2 times daily.       ??? albuterol (PROVENTIL;VENTOLIN) 90 MCG/ACT inhaler Inhale 2 puffs into the lungs every 6 hours as needed.         No current facility-administered medications for this visit.     REVIEW OF SYSTEMS:   CONSTITUTIONAL: No major weight gain or loss, fatigue, weakness, night sweats or fever. There's been no change in energy level, sleep pattern, or activity level.     HEENT: No new vision difficulties or ringing in the ears.  RESPIRATORY: No new SOB, PND, orthopnea or cough.   CARDIOVASCULAR: See HPI  GI: No nausea, vomiting, diarrhea, constipation, abdominal pain or changes in bowel habits.  GU: No urinary frequency, urgency, incontinence hematuria or dysuria.  SKIN: No cyanosis or skin lesions.  MUSCULOSKELETAL: No new muscle or joint pain.  NEUROLOGICAL: No  syncope or TIA-like symptoms.  PSYCHIATRIC: No anxiety, pain, insomnia or depression    Objective:   PHYSICAL EXAM:    Filed Vitals:    12/28/12 1306   BP: 158/94   Pulse: 62   Height: 5\' 6"  (1.676 m)   Weight: 217 lb (98.431 kg)         VITALS:  BP 158/94   Pulse 62   Ht 5\' 6"  (1.676 m)   Wt 217 lb (98.431 kg)   BMI 35.04 kg/m2    CONSTITUTIONAL: Cooperative, no apparent distress, and appears well nourished / developed  NEUROLOGIC:  Awake and orientated to person, place and time.  PSYCH: Calm affect.  SKIN: Warm and dry.  HEENT: Sclera non-icteric, normocephalic, neck supple, no elevation of JVP, normal carotid pulses with no bruits and thyroid normal size.  LUNGS:  No increased work of breathing and clear to auscultation, no crackles or wheezing.  CARDIOVASCULAR:  Regular rate 60 and rhythm with no murmurs, gallops, rubs, or abnormal heart sounds, normal PMI. The apical impulses not displaced.                               Heart tones are crisp and normal                                                                                               Cervical veins are not engorged                 JVP less than 8 cm H2O                                                                              The carotid upstroke is normal in amplitude and contour without delay or bruit    ABDOMEN:  Normal bowel sounds, non-distended and non-tender to palpation   EXT: No edema, no calf tenderness. Pulses are present bilaterally.    DATA:    Lab Results   Component Value Date    ALT 27 10/20/2012    AST 32 10/20/2012    ALKPHOS 92 10/20/2012    BILITOT 0.60 10/20/2012     Lab Results   Component Value Date    CREATININE 0.9 10/20/2012    BUN 15 10/20/2012    NA 142 10/20/2012    K 3.9 10/20/2012    CL 106 10/20/2012    CO2 29 10/20/2012     Lab Results   Component Value Date    TSH 1.36 10/20/2012     Lab Results   Component Value Date    WBC 7.8 10/20/2012    HGB 14.2 10/20/2012    HCT 43.6 10/20/2012    MCV 87.5 10/20/2012    PLT 216 10/20/2012  No components found with this basename: CHLPL     Lab Results   Component Value Date    TRIG 89 03/16/2012    TRIG 132 03/31/2011    TRIG 126 03/26/2010     Lab Results   Component Value Date    HDL 62* 03/16/2012    HDL 52 03/31/2011    HDL 53 03/26/2010     Lab Results   Component Value Date    LDLCALC 115* 03/16/2012    LDLCALC 125* 03/31/2011    LDLCALC 141* 03/26/2010     Lab Results   Component Value Date    LABVLDL 18 03/16/2012    LABVLDL 26 03/31/2011    LABVLDL 25 03/26/2010     Radiology Review:  Pertinent images / reports were reviewed as a part of this visit and reveals the following:    Last Echo: Nov '10:  Right Ventricular Dimension-  Left Ventricular Dimension- 4.9/1.9  Posterior Wall Thickness- 1.3  Septal Wall Thickness- 1.3  Aortic Root- 3.3  Aortic Cusps Separation- 2.3  Left Atrial Dimension- 3.7    FINDINGS- This is a technically adequate 2-dimensional  echocardiogram with Doppler. Left ventricular size is normal. There  may be mild left ventricular hypertrophy. Left ventricular function  is normal. Ejection fraction  is in the 60% range. Mitral valve is  normal. Left atrium is normal. Aortic valve is sclerotic. Aortic  root size is normal. Right ventricular size and function is normal.  Right atrium is normal. Tricuspid and pulmonic valves are  structurally normal. There is no pericardial effusion. Doppler study  fails to reveal significant valvular insufficiency.    IMPRESSION- Normal left ventricular function. Borderline left  ventricular hypertrophy.    Last Angiogram: 06/03/12:  Cath:   Normal coronaries and LVEF   Plan:   1. Continue Holter monitor      Assessment:     1. Other chest pain   *improved  *essentially normal epicardial arteries by cath   2. Palpitations   *Holter: NSR , short runs atrial tachy at which time did not correlate with her symptoms of dizziness and jaw pain. Will have her 30 day event recorder.   3. Essential hypertension, benign   High today. She states that it is well controlled at home. Will continue to monitor.   4. Other and unspecified hyperlipidemia   *HDL high; LDL reasonable   5. Tobacco abuse   Tobacco free for 5 months.   6. Dizziness: 30 day event recorder.    .    Plan: 30 day event recorder and will follow up in 6 weeks.     Thank you for allowing to [place] to participate in the care of Rachael Mays.      [facility]

## 2013-01-17 MED ORDER — FLUOCINONIDE 0.05 % EX CREA
0.05 % | CUTANEOUS | Status: DC
Start: 2013-01-17 — End: 2013-04-03

## 2013-02-07 NOTE — Addendum Note (Signed)
Addended by: Fanny Skates on: 02/07/2013 04:31 PM     Modules accepted: Orders

## 2013-02-23 LAB — CBC WITH AUTO DIFFERENTIAL
Basophils %: 0.4 %
Basophils Absolute: 0 10*3/uL (ref 0.0–0.2)
Eosinophils %: 6.5 %
Eosinophils Absolute: 0.7 10*3/uL — ABNORMAL HIGH (ref 0.0–0.6)
Hematocrit: 42.6 % (ref 36.0–48.0)
Hemoglobin: 13.9 g/dL (ref 12.0–16.0)
Lymphocytes %: 33.7 %
Lymphocytes Absolute: 3.5 10*3/uL (ref 1.0–5.1)
MCH: 28.4 pg (ref 26.0–34.0)
MCHC: 32.7 g/dL (ref 31.0–36.0)
MCV: 86.8 fL (ref 80.0–100.0)
MPV: 9 fL (ref 5.0–10.5)
Monocytes %: 8.8 %
Monocytes Absolute: 0.9 10*3/uL (ref 0.0–1.3)
Neutrophils %: 50.6 %
Neutrophils Absolute: 5.2 10*3/uL (ref 1.7–7.7)
Platelets: 210 10*3/uL (ref 135–450)
RBC: 4.91 M/uL (ref 4.00–5.20)
RDW: 14.4 % (ref 12.4–15.4)
WBC: 10.4 10*3/uL (ref 4.0–11.0)

## 2013-02-23 LAB — COMPREHENSIVE METABOLIC PANEL
ALT: 27 U/L (ref 10–40)
AST: 20 U/L (ref 15–37)
Albumin/Globulin Ratio: 1.3 (ref 1.1–2.2)
Albumin: 4 g/dL (ref 3.4–5.0)
Alkaline Phosphatase: 102 U/L (ref 40–129)
BUN: 19 mg/dL (ref 7–20)
CO2: 26 mmol/L (ref 21–32)
Calcium: 10.2 mg/dL (ref 8.3–10.6)
Chloride: 103 mmol/L (ref 99–110)
Creatinine: 1 mg/dL (ref 0.6–1.1)
GFR African American: >60 (ref >60)
GFR Non-African American: >60 (ref >60)
Globulin: 3.1 g/dL
Glucose: 111 mg/dL — ABNORMAL HIGH (ref 70–99)
Potassium: 4.3 mmol/L (ref 3.5–5.1)
Sodium: 144 mmol/L (ref 136–145)
Total Bilirubin: 0.4 mg/dL (ref 0.0–1.0)
Total Protein: 7.1 g/dL (ref 6.4–8.2)

## 2013-02-23 LAB — TSH: TSH: 1.29 u[IU]/mL (ref 0.27–4.20)

## 2013-02-23 LAB — CORTISOL TOTAL: Cortisol: 13 ug/dL

## 2013-02-23 NOTE — Progress Notes (Signed)
Subjective:      Patient ID: Rachael Mays is a 57 y.o. female.    HPI  Patient has had episodes over the past several months where she feels suddenly nervous or anxious, "like an animal just ran out in front of me while I was driving".  Associated with dizziness.  Symptoms last 20-30 minutes.  She then feels shaky and physically drained for the rest of the day.  Associated with loss of balance at times.  Not provoked by any activity.  Not associated with headache.  No nausea or emesis.  No chest pain but feels heart race at times.  Feels fatigued.      Patient has had month long event recorder, cardiology consultation, and heart catheterization.  Etiology of symptoms remains unclear.         Review of Systems    Objective:   Physical Exam   Constitutional: She is oriented to person, place, and time. She appears well-developed and well-nourished. No distress.   Eyes: EOM are normal. Pupils are equal, round, and reactive to light.   Cardiovascular: Normal rate, regular rhythm and normal heart sounds.    No murmur heard.  Pulmonary/Chest: Effort normal and breath sounds normal. She has no wheezes. She has no rales.   Musculoskeletal:   5/5 bilateral upper extremity strength   Neurological: She is alert and oriented to person, place, and time. No cranial nerve deficit.   Psychiatric: She has a normal mood and affect. Her behavior is normal.       Assessment:     1. Fatigue    2. Palpitations    3. Dizziness        Plan:   Check cmp, cbc with diff, tsh, cortisol, and plasma metanephrines.  If lab w/u negative, will refer to ENT for further evaluation.

## 2013-02-23 NOTE — Progress Notes (Signed)
Pt is here due to dizziness, she states they have been getting worse, makes her feels like she's going to pass out and gets" jitters real bad"  and has been getting a ringing in her right ear for the last 2 weeks.

## 2013-02-28 LAB — METANEPHRINES PLASMA FREE
Metanephrine, Free: 0.14 nmol/L (ref 0.00–0.49)
Normetanephrine, Free: 0.39 nmol/L (ref 0.00–0.89)

## 2013-03-10 NOTE — Progress Notes (Signed)
Dietary Assessment Note    Vitals:   Filed Vitals:    03/10/13 1038   BP: 135/85   Pulse: 67   Resp: 18   Height: 5\' 6"  (1.676 m)   Weight: 227 lb 8 oz (103.193 kg)    Patient lost 13.7 lbs over the past 3 years.    Labs reviewed: no lab studies available for review at time of visit- no vitamin labs available    Protein intake: <60 grams/day     Fluid intake: 48-64 oz/day    Multivitamin/mineral intake: centrum equilavent 1 daily    Calcium intake: calcium citrate 2 daily    Other: Vitamin D3    Exercise: Yes. How much: daily. What kind: cardio, weights    Nutrition Assessment: 5 year post-op visit. Pt states she she has been struggling with her weight recently regardless of diet change. She has noticed a change in her exercise but plans to increase this and has recently. She states she stopped smoking 7 months ago. She is eating 2 meals and 2 snacks daily. Reviewed diet recall. Overall food choices seem good. She is doing well including protein at meals but not always at snacks- made recommendations. She is avoiding fried foods and is baking her meats.    Food Intolerances/issues: none    Client Concerns: would like to consider switching to sleeve    Goals: include protein with snacks- provided list    Plan: follow up monthly      Cristie HemAshleigh N Cassady Turano

## 2013-03-10 NOTE — Patient Instructions (Signed)
Patient received dietary handouts and education.

## 2013-03-10 NOTE — Progress Notes (Signed)
Subjective:      Patient ID: Rachael Mays is a 57 y.o. female.    HPI - 5 yrs s/p band    Review of Systems   Constitutional: Negative.    HENT: Negative.    Eyes: Negative.    Respiratory: Negative.    Cardiovascular: Negative.    Gastrointestinal: Negative.    Endocrine: Negative.    Genitourinary: Negative.    Allergic/Immunologic: Negative.    Neurological: Negative.    Hematological: Negative.    Psychiatric/Behavioral: Negative.        Objective:   Physical Exam   Constitutional: She is oriented to person, place, and time. She appears well-developed and well-nourished.   HENT:   Head: Normocephalic and atraumatic.   Eyes: Conjunctivae are normal. Pupils are equal, round, and reactive to light.   Neck: Normal range of motion. Neck supple.   Pulmonary/Chest: Effort normal.   Abdominal: Soft.   Musculoskeletal: Normal range of motion.   Neurological: She is alert and oriented to person, place, and time.   Skin: Skin is warm and dry.   Psychiatric: She has a normal mood and affect. Her behavior is normal. Judgment and thought content normal.       Assessment:            Plan:      Patient is 5 yrs s/p band, down 72lbs.  She is doing well, denies n/v/dsyphagia or reflux.  She is doing well with diet and exercise despite gastric banding.  She has 10.25ccs in her band.  Her BMI is still 36.72 and she has significant comorbidities of HTN, chronic back pain, depression and h/o PE.  She has stopped smoking 7 months ago.  Encouraged continued smoking cessation.  She is exercising, walking daily at least , she also does cardio and weights at the gym.  Discussed preop work up for sleeve gastrectomy.  We will see her back in 1 month for continued follow up.  I have spent over 15 min counseling patient.

## 2013-03-29 NOTE — Patient Instructions (Signed)
1. Proceed with audiogram and ENG testing.  This testing will take about 90 minutes, in this office.  2. You must not take any pain medication, sleeping pills, sedative, antihistamines, anxiety or depression medications, alcohol, or any medications that can make you drowsy for 48 to 72 hours before the ENG test.  3. Someone will need to drive you home after the ENG testing.   4. Wear comfortable loose fitting clothing on the day of testing.

## 2013-03-29 NOTE — Progress Notes (Signed)
[facility]   [facility]         PCP:  No primary provider on file.    Chief Complaint   Patient presents with   ??? Dizziness       HISTORY OF PRESENT ILLNESS:  Location: Ears head  Quality: dizziness ===> feel lightheadedness, everything spins, feel as if have to sit, if don't sit I stagger.    Severity: severe.  Duration: 6 months.  Timing: off and on, daily, 3 to 4 times, lasts 2 to 10 minutes.  Context:  Comes when sitting or moving or active.   Modifying factors:  No ppt factors, no alleviating or aggravating factors.    Associated symptoms: shakes, and heart races.  She stated that she often smells and tastes blood when she sneezes or coughs.      REVIEW OF SYSTEMS:  Constitutional:  Denied current fever, chills, and weight loss.     Eyes:   Denied current double vision, blurred vision, and ocular pain.     Ears, Nose, Throat:  Tinnitus, right ear for 6 months, comes and goes, not synchronous with dizziness.   Always feels stopped up and stuffy, nasal obstruction, feels obstructed at night after lies down, and nasal dyspnea.  Denied current otorrhea, otalgia, hearing loss, epistaxis, post nasal drainage, rhinorrhea, sore throat and chronic hoarseness.    Cardiovascular:  Denied current chest pains, palpitations, and syncope.     Respiratory:  Denied current dyspnea, shortness of breath, wheezing, hemoptysis, and chronic cough.   Gastrointestinal:  Denied current dysphagia, nausea, and vomiting.      Genitourinary:  Denied current dysuria and hematuria.  Musculoskeletal:  Denied current pain in muscles or bones or joints; Denied arthritis.     Integumentary (skin):  Rash, all over, due to eczema.  Denied current hives and non-healing skin ulcers/lesions.     Neurologic:  Denied current numbness, weakness or paralysis of any body parts.  Denied chronic or frequently recurrent headache.  Denied history of stroke.  Denied current mental confusion, loss of consciousness, difficulty with speech  or swallowing.     Psychiatric:  Reported anxiety disorder, treated by Dr. Mack Hook, Lexapro.  Denied current depression disorder, and panic disorder.    Endocrine:  Denied current diabetes and thyroid disorders.     Hematologic/Lymphatic:  Denied current anemia, unexplained or excessive bruising, or prolonged and excessive bleeding. Denied current enlarged lymph nodes.     Allergic/Immunologic:  H/O environmental allergies, sneezing, itching, rhinorrhea, off and on for past week.  Denied current hives, and frequent infections.         PAST MEDICAL, FAMILY, AND SOCIAL HISTORY:    Past Medical History   Diagnosis Date   ??? Anxiety    ??? Asthma    ??? Depression    ??? Hypertension    ??? Obesity        Past Surgical History   Procedure Laterality Date   ??? Cholecystectomy     ??? Colonoscopy     ??? Cesarean section     ??? Hysterectomy     ??? Spine surgery     ??? Upper gastrointestinal endoscopy         Family History   Problem Relation Age of Onset   ??? Arthritis Mother    ??? Cancer Mother    ??? Depression Mother    ??? Heart Disease Mother    ??? High Blood Pressure Mother    ??? Heart Disease Maternal Grandmother    ???  Early Death Paternal Grandmother    ??? Heart Disease Paternal Grandfather        History     Social History   ??? Marital Status: Married     Spouse Name: N/A     Number of Children: N/A   ??? Years of Education: N/A     Occupational History   ??? Not on file.     Social History Main Topics   ??? Smoking status: Former Smoker     Quit date: 07/19/2012   ??? Smokeless tobacco: Not on file   ??? Alcohol Use: Not on file   ??? Drug Use: Not on file   ??? Sexually Active: Not on file     Other Topics Concern   ??? Not on file     Social History Narrative   ??? No narrative on file       EXAMINATION, COMPREHENSIVE:  Filed Vitals:    03/29/13 1358   BP: 136/71   Pulse: 60     Constitutional:  ?? Vitals signs reviewed.  ?? General appearance: Well developed, well nourished, no apparent deformities.  ?? Ability to communicate/quality of Voice: Communicated  without difficulty.  Normal voice.  No hoarseness.  No hot potato voice.       Head and Face:  ?? Inspection: Normocephalic.  No evidence of trauma.  No tenderness.  Normal overall appearance with no scars, lesions or masses.  ?? Sinuses: The maxillary and frontal sinuses were nontender, bilaterally.         ?? Salivary glands:  Parotid, submandibular and sublingual glands normal.    ?? Facial strength, motion: Normal and equal for all five branches bilaterally.      Eyes:   ?? Extraocular motion: intact, normal primary gaze alignment.  Vision grossly intact to finger count.  No nystagmus at any point of gaze.     Ears, nose, mouth, & throat:  ?? Otoscopic exam:   Right ear:  External auditory canal was normal. Tympanic membrane was normal.   Left ear:  External auditory canal was normal. Tympanic membrane was normal.    ?? Hearing assessment:  Able to hear finger rub and soft voice bilaterally.  Tuning fork tests: Weber midline and Rinne air > bone bilaterally, 512 Hertz tuning fork.    ?? External Ear/Nose: Normal pinnae. Normal external nose. No scars, lesions or masses.  ?? Nose: The nasal mucosa, septum, inferior turbinates, and secretions were normal. No pus or polyps were seen.       ?? Lips, teeth and gums:  Unremarkable.   ?? Oropharynx: Oral mucosa, hard and soft palates, tongue, and pharynx were normal.    ?? Inspection of pharyngeal walls and pyriform sinuses:  Normal with no pooling of saliva asymmetry or lesions.   ?? Indirect Laryngoscopy, mirror examination:  The epiglottis, false vocal cords, true vocal cords,  mobility of the larynx, supraglottis, piriform sinuses, and base of tongue appeared normal.     ?? Nasopharynx, mirror examination:  mucosa, adenoids, posterior choanae, and eustachian tubes appeared normal.      Neck:   ?? Neck:  No masses, abnormal appearance, asymmetry or abnormal tracheal position.    ?? Thyroid:  No enlargement, tenderness or mass.       Respiratory:  ?? Chest, inspection:  Normal  symmetrical expansion, and respiratory effort.    ?? Lungs, auscultation: clear, with normal breath sounds and no rales, rhonchi, or rubs.     Cardiovascular:  ?? Heart,  auscultation:  normal S1 and S2.  No abnormal sounds or murmurs.      Lymphatic:   ?? Palpation of lymph nodes, cervical, facial and supraclavicular: Nontender, No lymphadenopathy.      Neurological/Psychiatric:    ?? Cranial nerves:  II - XII intact, with no apparent deficits.  ?? Orientation to time, place, and person: Oriented x 3.  ?? Mood and Affect: Normal.  No evidence of depression, anxiety or agitation.    Cerebellar testing: Finger to nose, heel to shin, and rapid alternating motion intact.  Rhomberg negative.

## 2013-04-03 ENCOUNTER — Encounter

## 2013-04-03 MED ORDER — CLOTRIMAZOLE 1 % EX CREA
1 % | CUTANEOUS | Status: AC
Start: 2013-04-03 — End: 2013-04-10

## 2013-04-03 NOTE — Progress Notes (Signed)
Subjective:      Patient ID: Rachael Mays is a 57 y.o. female.    HPI  Pain on tailbone when sitting over the last 1-2 weeks.  No injury.  Tenderness after sitting for prolonged periods of time.  Pain relieved by adjusting position.      Rash on abdomen that is itchy and circular.    Review of Systems    Objective:   Physical Exam   Constitutional: She is oriented to person, place, and time. She appears well-developed and well-nourished. No distress.   Cardiovascular: Normal rate, regular rhythm and normal heart sounds.    Pulmonary/Chest: Effort normal and breath sounds normal. She has no wheezes. She has no rales.   Musculoskeletal:   Tenderness (slight) in sacral spine but no tenderness directly over the coccyx   Neurological: She is alert and oriented to person, place, and time.   Skin:        Psychiatric: She has a normal mood and affect. Her behavior is normal.       Assessment:     1. Coccyodynia  X-ray sacrum and coccyx   2. Tinea corporis  clotrimazole (LOTRIMIN) 1 % cream        Plan:   Check xray sacrum and coccyx.  Donut and ibuprofen for relief.  Clotrimazole for suspected tinea on abdomen.

## 2013-04-06 NOTE — Telephone Encounter (Signed)
NGS does not require precert for psych eval

## 2013-04-11 NOTE — Progress Notes (Signed)
Rachael Mays presented for her presurgical psychological evaluation on 04/11/13. The evaluation consisted of a clinical interview, the Eating Habits Checklist, and the [employer] [facility] Diagnostic (MBMD).    Based on the evaluation, Rachael Mays is considered to be an appropriate candidate for bariatric surgery from a psychological standpoint. She acknowledges longstanding but intermittent depressive symptoms and anxiety which she attributes to marital stressors. She is currently prescribed Lexapro 20mg  and Xanax .5mg  prn by her PCP. She notes she rarely takes the Xanax, as the Lexapro is quite effective in managing her symptoms. She reports no history of psychological intervention. She denies a history of suicidal ideation or suicide attempts, and has never been hospitalized psychiatrically. There is no indication of chemical abuse or dependence, with the exception of a history of nicotine dependence. She states she quit smoking in 2013 after having smoked for 35-40 years; she reports no history of relapse. She denies a history of trauma.    Rachael Mays has never been diagnosed with an eating disorder, and her responses in the interview and on the Eating Habits Checklist do not warrant a clinical diagnosis. She acknowledges a strong tendency to overeat in response to emotional stress and occasionally to boredom. She endorsed self-punitive thoughts and feelings related to her weight and/or eating behaviors that likely heighten her preoccupation with food. She reports binge eating twice/month on average, but denies any history of purging behavior. While she has lost almost 100 lbs. since her lap band procedure in 2009, she reports chronic complications and a current lack of restriction. 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 She was last employed in 2013 and indicated she is looking for  work, although she states her husband would prefer that she not work. She currently resides with her second husband of 12 years. She maintains regular contact with her two grown daughters and her grandson.    Rachael Mays completed the MBMD as part of the evaluation. Her profile is valid. Results indicate inactivity as a possible problem area, and eating as a likely problem area at this time. There is no indication of acute psychiatric distress, including anxiety, depression, emotional lability, or cognitive dysfunction. Her profile is characterized by a sociable and self-assured manner that is only likely to give way under conditions of significant or prolonged stress. As a medical patient, her initial response to illness is likely to be one of denial; however, once she begins to take her illness seriously, she will be motivated to follow a prescribed treatment regimen, if only to alleviate her symptoms and regain a sense of invulnerability. She is inclined to be friendly and cooperative with providers, although some individuals with this profile struggle to follow the rules and expectations of others unless they consider them worthwhile and/or once their symptoms abate. Providers should reinforce the importance of her role in maintaining her self-care in order to draw upon her desire for self-mastery and enhance compliance. Ms. Cohrs endorsed more social isolation than the typical patient. Coping assets reflected in her profile include: functional competence, optimism, spiritual faith, medication conscientiousness, and openness to receiving feedback and/or discussing matters pertaining to her health.     Rachael Mays exhibited adequate awareness of the risks of bariatric surgery; however, she believes the benefits will outweigh the potential risks, as she hopes to reinforce and continue to make health improvements as she has  since her lap band surgery in 2009. She reports realistic expectations for the  procedure, as her overall goals include improved health and a goal weight of 150 lbs. She understands the need for permanent lifestyle change, including dietary modifications and a regular exercise program, and expressed confidence in her ability to implement the necessary changes. She expressed a commitment to comply with treatment recommendations through this office. She identified her husband and her daughters as a good support system in her efforts to meet her weight management goals.    In summary, Rachael Mays is considered to be an appropriate candidate for bariatric surgery from a psychological standpoint. She was encouraged to participate in support group activities through [facility], and to consult with our staff should she experience any significant post-surgical mood changes or have difficulty modifying her eating behaviors. Feel free to consult with me as needed with any further questions regarding this evaluation.

## 2013-04-17 NOTE — Progress Notes (Signed)
The patient is here for post-operative visit following a 4 year follow up status adj band.  She is interested in converting to a sleeve.  She is overall doing well, needs to eat more times/day.  She has done well with weight loss and is down a total of 72lbs since her surgery.  Does have pain at port site and does have food get stuck on occasion.    We discussed how her weight affects their health and possible medical problems, including.    Past Medical History   Diagnosis Date   ??? Chronic back pain    ??? Deep vein thrombosis    ??? Depression    ??? GERD (gastroesophageal reflux disease)      NO LONGER SINCE LAP   ??? Pulmonary embolism    ??? Obesity      hx of; had lap band   ??? Hypertension      no problems since lap SURGERY   ??? Hyperlipidemia      hx; resolved with lap band       Filed Vitals:    04/17/13 1534   BP: 141/79   Pulse: 57   Resp: 18       Abdomen:  Soft NT/ND, no rebound/gaurding.  Wounds healing well.      The patient underwent 30 minutes of dietary counseling and I have discussed, reviewed and agree with the dietary plan of action.   We discussed the importance of following the dietary recommendations.  Also, we discussed the benefits of attending support groups and the online support program.    All questions were answered.    She is also have a workup for vertigo.    Will get EGD to eval band.    The patient will follow up in 1 months    I spent over 20 minutes of face to face counseling time with the patient.

## 2013-04-17 NOTE — Patient Instructions (Signed)
Patient received dietary handouts and education.

## 2013-04-17 NOTE — Progress Notes (Signed)
Rachael Mays gained 2.5 lbs over the past month.  She feels that the band is not working.  Her portions are larger.  She is eating 3 times a day.  She eats an egg with 2 slices of toast with butter and jelly for breakfast.  She skips lunch but she might snack on popcorn.  She normally has a meat, potato with veggie and bread for dinner.  She might get up in the middle of the night to eat cake or cookies.   She avoids soda but she is drinking some caffeine.  She is walking daily.  Goal: Make sure she is eating 5-6 times a day. Handouts given: none. Will follow up as necessary.

## 2013-04-24 ENCOUNTER — Telehealth

## 2013-04-24 NOTE — Telephone Encounter (Signed)
Not sure what underlying cause is.  Could do MRI to better evaluate if pain persists.  Usually, insurance requires 6 weeks with no improvement.

## 2013-04-24 NOTE — Telephone Encounter (Signed)
Please advise

## 2013-04-24 NOTE — Telephone Encounter (Signed)
Pt wants to know what the cause is.. She said the pain has gotten worse.  Can not sit for long periods of time.

## 2013-04-24 NOTE — Telephone Encounter (Signed)
Okay for vicodin 5/325, 1 tab po q 4 hours prn pain, #30.  Let us know if this helps.

## 2013-04-24 NOTE — Telephone Encounter (Signed)
Pt called stating that she was recently in for tail bone pain.   Pt states she was prescribed some ibuprofen for pain.   Pt states her pain has gotten even worse and needs advice on what to do about this situation.   Please advise.

## 2013-04-25 MED ORDER — HYDROCODONE-ACETAMINOPHEN 5-325 MG PO TABS
5-325 MG | ORAL_TABLET | ORAL | Status: DC | PRN
Start: 2013-04-25 — End: 2013-07-14

## 2013-04-25 NOTE — Telephone Encounter (Signed)
Called in Vicodin.  MRI faxed to [facility] for precert and schedule.

## 2013-04-25 NOTE — Telephone Encounter (Signed)
Pt is calling back wanting to speak with the nurse about her tail bone issue.   Pt states she really don't like taking medication and would like to have an MRI. Please advise

## 2013-04-27 NOTE — Patient Instructions (Addendum)
1. Proceed with MRI scan of the brain with contrast.  2. We will consider vestibular rehabilitation referral/process, if vestibular exercises are not effective.  3. Avoid working at heights, on ladders or scaffolding or near the edges of platforms or using heavy or complicated machinery or operating a motorized vehicle if you are experiencing dizziness.    4. You should return for an annual hearing test to monitor your hearing, and whenever your hearing further decreases significantly. .  5. You should employ the tinnitus masking techniques and strategies we discussed, as needed.  6. You should avoid exposure to excessively high levels of noise/sound and use hearing protection measures we discussed, as needed, if such exposure is unavoidable.  7. You should never clean your ears with a Q-tip, cotton tipped applicator, safety pin, or any other instrument.  I recommend only use of one the several ear wax removal kits available "over the counter" if you feel a need to try to remove ear wax.   No other methods should be self used for this purpose as there is danger of injury to the ear and risk of irreparable and irreversible permanent hearing loss.  8. Return in about 6 weeks (around 06/08/2013) for recheck and sooner if condition worsens.      =========================================================          VESTIBULAR (DIZZINESS) EXERCISES        The following exercises are recommended to gradually decrease your positional vertigo and dizziness.    Perform these exercises once or twice a day.  Sit on a pad or blanket on the floor or on your bed.  Rapidly lie back on your back with your head straight in the middle position.  If you become dizzy, remain in that position and allow it to subside.  Next, sit up quickly with head in the same position.  Again, if you are dizzy, allow it to subside.  Repeat five or six times.  Then, repeat the entire sequence with your head turned to the right and, then, to the left.  Repeat  the sequence lying on your right side and on your left side.  Next, you should assume whatever position or make whatever motion tends to cause your dizziness and repeat 5-6 times.    Generally, you will find that after 4-6 repetitions, you will no longer become dizzy with that position or motion.      Gradually, these exercises will help your brain to compensate for, or ignore, the improper signals from your malfunctioning inner ear, which make you feel dizzy.  When that occurs, you will begin to experience less dizziness.    Please contact the office if you have any further questions or concerns.

## 2013-04-27 NOTE — Progress Notes (Signed)
AUDIOGRAM:  Normal.    ENG:   Normal.    COUNSELING:  Audiogram and ENG results reviewed and discussed with Rinaldo Cloud.   I discussed the possible etiology, vestibular dysfunction and impairment and possible disability, vestibular exercises, possible benefits of vestibular rehabilitation therapy, activity and safety precautions, fall prevention and avoidance, and need for continued follow-up.  I discussed possible etiologies of dizziness including, but not limited to: Meniere's disease, vestibular neuronitis, acute labyrinthitis, idiopathic peripheral vestibular dysfunction, benign paroxysmal positional vertigo, autoimmune hearing loss and vestibular dysfunction, multiple sclerosis, acoustic neuroma, CNS neoplasm, other CNS disease.      IMPRESSION:   1. Dizziness  MRI Brain W WO Contrast   2. Tinnitus, subjective, right  MRI Brain W WO Contrast       PLAN:  Patient Instructions   1. Proceed with MRI scan of the brain with contrast.  2. We will consider vestibular rehabilitation referral/process, if vestibular exercises are not effective.  3. Avoid working at heights, on ladders or scaffolding or near the edges of platforms or using heavy or complicated machinery or operating a motorized vehicle if you are experiencing dizziness.    4. You should return for an annual hearing test to monitor your hearing, and whenever your hearing further decreases significantly. .  5. You should employ the tinnitus masking techniques and strategies we discussed, as needed.  6. You should avoid exposure to excessively high levels of noise/sound and use hearing protection measures we discussed, as needed, if such exposure is unavoidable.  7. You should never clean your ears with a Q-tip, cotton tipped applicator, safety pin, or any other instrument.  I recommend only use of one the several ear wax removal kits available "over the counter" if you feel a need to try to remove ear wax.   No other methods should be self used for this purpose  as there is danger of injury to the ear and risk of irreparable and irreversible permanent hearing loss.  8. Return in about 6 weeks (around 06/08/2013) for recheck and sooner if condition worsens.      =========================================================          VESTIBULAR (DIZZINESS) EXERCISES        The following exercises are recommended to gradually decrease your positional vertigo and dizziness.    Perform these exercises once or twice a day.  Sit on a pad or blanket on the floor or on your bed.  Rapidly lie back on your back with your head straight in the middle position.  If you become dizzy, remain in that position and allow it to subside.  Next, sit up quickly with head in the same position.  Again, if you are dizzy, allow it to subside.  Repeat five or six times.  Then, repeat the entire sequence with your head turned to the right and, then, to the left.  Repeat the sequence lying on your right side and on your left side.  Next, you should assume whatever position or make whatever motion tends to cause your dizziness and repeat 5-6 times.    Generally, you will find that after 4-6 repetitions, you will no longer become dizzy with that position or motion.      Gradually, these exercises will help your brain to compensate for, or ignore, the improper signals from your malfunctioning inner ear, which make you feel dizzy.  When that occurs, you will begin to experience less dizziness.    Please contact the office if you have  any further questions or concerns.          Total visit time =  26 minutes; Counseling time = 26 minutes.

## 2013-05-02 ENCOUNTER — Encounter

## 2013-05-02 MED ORDER — ESCITALOPRAM OXALATE 20 MG PO TABS
20 MG | ORAL_TABLET | ORAL | Status: DC
Start: 2013-05-02 — End: 2013-05-18

## 2013-05-02 MED ORDER — GADOVERSETAMIDE 330.9 MG/ML IV SOLN
330.9 MG/ML | Freq: Once | INTRAVENOUS | Status: AC
Start: 2013-05-02 — End: 2013-05-02
  Administered 2013-05-02: 22:00:00 20 mL via INTRAVENOUS

## 2013-05-02 MED ADMIN — sodium chloride (PF) 0.9 % injection 10 mL: 10 mL | INTRAVENOUS | @ 22:00:00

## 2013-05-02 MED FILL — NORMAL SALINE FLUSH 0.9 % IV SOLN: 0.9 % | INTRAVENOUS | Qty: 10

## 2013-05-05 NOTE — Telephone Encounter (Addendum)
You may inform her that it was normal.  Per my last office visit note, she is to return on or about 06/08/13 for FU appt.

## 2013-05-05 NOTE — Telephone Encounter (Signed)
Patient phoned office requesting her MRI results. MRI result are final. I informed Patient we would call her back once they have been read by Dr. Mare Loan.  [phone] (home) [phone] (work)

## 2013-05-08 NOTE — Telephone Encounter (Signed)
Patient phoned office and was informed her results were normal. She has a follow up with [place] June 07, 2013.

## 2013-05-08 NOTE — Telephone Encounter (Signed)
Left message on patients home phone [phone], to phone our office back. (regarding MRI results and to schedule a f/u appt around the beginning of July)

## 2013-05-09 NOTE — Telephone Encounter (Signed)
Reassuring that MRI is normal.  Resolution can take weeks to months.  If debilitating, could refer to pain mgmt or PT.

## 2013-05-09 NOTE — Telephone Encounter (Signed)
Notified pt

## 2013-05-09 NOTE — Telephone Encounter (Signed)
Pt states she was told by our office that her MRI was negative, but says her tailbone pain in horrible and wants to know what to do next.  Specialist??

## 2013-05-10 NOTE — Telephone Encounter (Signed)
NPR for band fill w/ NGS

## 2013-05-16 MED ORDER — GABAPENTIN 300 MG PO CAPS
300 MG | ORAL_CAPSULE | ORAL | Status: DC
Start: 2013-05-16 — End: 2013-08-05

## 2013-05-18 NOTE — Progress Notes (Signed)
Rachael Mays gained 2 lbs over the past month.  Pt wishes to have the sleeve.  She is not feeling restriction and her portions are larger.  She is cutting back on her high sugar and high fat consumption.  She is eating 2 meals a day and 2 snacks a day.  She has an egg with toast and goetta for breakfast and she will snack on veggies mid morning.  She skips lunch and she might snack on crackers and chips in the afternoon.  She has chili with 1/2 grilled cheese.  She might snack on ff popcorn in the evening.  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 Goal: Include lunch and have protein at her afternoon snack. Handouts given: none. Will follow up as necessary.

## 2013-05-18 NOTE — Progress Notes (Signed)
The patient is a 57 y.o. female who returns today for follow up. We discussed how her weight affects her overall health including:  Patient Active Problem List   Diagnosis   ??? Back Pain   ??? Deep Vein Thrombosis   ??? Depression   ??? High Cholesterol   ??? Hypertension   ??? Pulmonary Embolism   ??? Abdominal  Pain, Other Specified Site   ??? Morbid Obesity   ??? Status post gastric banding   ??? Vertigo   ??? Dizziness   ??? Tinnitus, subjective      The patient's current Body mass index is 37.46 kg/(m^2). (05/18/2013).  Since her last visit she has gained 2 lbs over 1 month. Rachael Mays underwent thirty minutes of dietary counseling, and I have reviewed and agree with the dietary counseling, and I have reviewed and agree with the diet plan. There are no changes in the patients medical history or physical exam.   She is wokring on  Changes.  She has done well with the band and is down 70lbs, but wants to have a sleeve as she wants to lose more weight.    I spent a total of 20 minutes with the a patient and  Greater than 50% was face to face counseling time with the patient to discuss the issues covered in this note.    The patient will follow up in 1 month

## 2013-05-18 NOTE — Patient Instructions (Addendum)
Name_______________________________________Printed:____________________  Date and time of surgery____6/13/14  0900____________________Arrival Time:____0730 main hosp.____________   1. Do not eat or drink anything after 12 midnight (or____hours) prior to surgery. This includes no water, chewing gum or mints.   2. Take the following pills with a small sip of water on the morning of surgery__ Omeprazole,then zyrtec, xanax,pain pill and albuteral if needed__________________________________________________   3. Aspirin, Ibuprofen, Advil, Naproxen, Vitamin E and other Anti-inflammatory products should be stopped for 5 days before surgery or as directed by your physician.   4. Check with your Doctor regarding stopping Plavix, Coumadin,Eliquis, Lovenox,Effient,Pradaxa,Xarelto, Fragmin or other blood thinners and follow their instructions.   5. Do not smoke, and do not drink any alcoholic beverages 24 hours prior to surgery.  This includes NA Beer.   6. You may brush your teeth and gargle the morning of surgery.  DO NOT SWALLOW WATER   7. You MUST make arrangements for a responsible adult to take you home after your surgery. You will not be allowed to leave alone or drive yourself home.  It is strongly suggested someone stay with you the first 24 hrs. Your surgery will be cancelled if you do not have a ride home.   8. A parent/legal guardian must accompany a child scheduled for surgery and plan to stay at the hospital until the child is discharged.  Please do not bring other children with you.   9. Please wear simple, loose fitting clothing to the hospital.  Do not bring valuables (money, credit cards, checkbooks, etc.) Do not wear any makeup (including no eye makeup) or nail polish on your fingers or toes.             10. DO NOT wear any jewelry or piercings on day of surgery.  All body piercing jewelry must be removed.             11. If you have ___dentures, they will be removed before going to the OR; we will provide you  a container.  If you wear ___contact lenses or ___glasses, they will be removed; please bring a case for them.             12. Please see your family doctor/pediatrician for a history & physical and/or concerning medications.  Bring any test results/reports from your physician's office.   PCP__[facility]________________Phone___________H&P Appt. Date________             13 If you  have a Living Will and Durable Power of Attorney for Healthcare, please bring in a copy.             14. Notify your Surgeon if you develop any illness between now and surgery  time, cough, cold, fever, sore throat, nausea, vomiting, etc.  Please notify your surgeon if you experience dizziness, shortness of breath or blurred vision between now & the time of your surgery             15. DO NOT shave your operative site 96 hours prior to surgery. For face & neck surgery, men may use an electric razor 48 hours prior to surgery.             16. Shower the night before surgery with _x__Antibacterial soap ___Hibiclens             17. To provide excellent care visitors will be limited to one in the room at any given time.             18.  Please bring picture ID and insurance card.             19.  Visit our web site for additional information:  [email or web address]              20.During flu season no children under the age of 62 are permitted in the hospital for the safety of all patients.                              21. If you take a long acting insulin in the evening only  take half of your usual  dose the night  before your procedure              22. If you use a c-pap please bring DOS if staying overnight,             23.For your convenience [facility] has a pharmacy on site to fill your prescriptions.             24. If you use oxygen and have a portable tank please bring it  with you the DOS             25. Other ____________________________________________     *Please call pre admission testing if you any further questions   [place]          [place]   [place]    [place]  [id number]   [place]       All above information reviewed with patient in person or by phone.Patient verbalizes understanding.All questions and concerns addressed.                                                                                                 Patient/Rep____________________                                                                                                                                    PRE OP INSTRUCTIONS

## 2013-05-18 NOTE — Patient Instructions (Signed)
Patient received dietary handouts and education.

## 2013-05-19 LAB — COMPREHENSIVE METABOLIC PANEL
ALT: 19 U/L (ref 10–40)
AST: 25 U/L (ref 15–37)
Albumin/Globulin Ratio: 1.3 (ref 1.1–2.2)
Albumin: 3.9 g/dL (ref 3.4–5.0)
Alkaline Phosphatase: 84 U/L (ref 40–129)
BUN: 16 mg/dL (ref 7–20)
CO2: 26 mmol/L (ref 21–32)
Calcium: 8.9 mg/dL (ref 8.3–10.6)
Chloride: 103 mmol/L (ref 99–110)
Creatinine: 0.8 mg/dL (ref 0.6–1.1)
GFR African American: >60 (ref >60)
GFR Non-African American: >60 (ref >60)
Globulin: 2.9 g/dL
Glucose: 89 mg/dL (ref 70–99)
Potassium: 3.5 mmol/L (ref 3.5–5.1)
Sodium: 140 mmol/L (ref 136–145)
Total Bilirubin: 0.3 mg/dL (ref 0.0–1.0)
Total Protein: 6.8 g/dL (ref 6.4–8.2)

## 2013-05-19 LAB — CBC
Hematocrit: 38.5 % (ref 36.0–48.0)
Hemoglobin: 12.8 g/dL (ref 12.0–16.0)
MCH: 28.4 pg (ref 26.0–34.0)
MCHC: 33.1 g/dL (ref 31.0–36.0)
MCV: 85.8 fL (ref 80.0–100.0)
MPV: 9.1 fL (ref 5.0–10.5)
Platelets: 190 10*3/uL (ref 135–450)
RBC: 4.49 M/uL (ref 4.00–5.20)
RDW: 13.3 % (ref 12.4–15.4)
WBC: 7.3 10*3/uL (ref 4.0–11.0)

## 2013-05-19 MED ORDER — ONDANSETRON HCL 4 MG/2ML IJ SOLN
4 | Freq: Once | INTRAMUSCULAR | Status: AC | PRN
Start: 2013-05-19 — End: 2013-05-19

## 2013-05-19 MED ORDER — LIDOCAINE HCL (PF) 1 % IJ SOLN
1 | Freq: Once | INTRAMUSCULAR | Status: AC | PRN
Start: 2013-05-19 — End: 2013-05-19

## 2013-05-19 MED ADMIN — lactated ringers infusion: INTRAVENOUS | @ 12:00:00 | NDC 00338011704

## 2013-05-19 MED FILL — LACTATED RINGERS IV SOLN: INTRAVENOUS | Qty: 1000

## 2013-05-19 NOTE — Progress Notes (Signed)
Patient/report received from [place] [facility], vss ,a/o

## 2013-05-19 NOTE — Anesthesia Pre-Procedure Evaluation (Signed)
Rachael Mays     Anesthesia Evaluation     Patient summary reviewed    No history of anesthetic complications   Airway   Mallampati: II  TM distance: >3 FB  Neck ROM: full  Dental - normal exam     Pulmonary - normal exam    breath sounds clear to auscultation  (+) asthma (uses albuterol 2 times per month),   Cardiovascular - normal exam  (+) hypertension,   (-) pacemaker, CABG/stent, angina, CHF    Rhythm: regular  Rate: normal  ROS comment: Cath 2013 nrl coronary arteries  Patient states she wore halter monitor for a month following cath and was ok  2012 NM EF 55% WNL    Neuro/Psych    Comments: 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  Now on gabapentin.  No episodes since  GI/Hepatic/Renal    (+) GERD (omeprazole taken today),     Endo/Other    Abdominal   (+) obese,                   Allergies: Nystatin; Nystatin; Talwin; and Pentazocine lactate    NPO Status: more than 8 hrs                           Anesthesia Plan    ASA 3     general   (2 puffs of albuterol prophylaxis  Patient verbalizes understanding that there is the possibility of recall with TIVA.  Patient verbalizes her wishes to proceed with planned anesthetic.)  intravenous induction   Anesthetic plan and risks discussed with patient.    Plan discussed with CRNA.          Minerva Areola  05/19/2013

## 2013-05-19 NOTE — Progress Notes (Signed)
Patient d/c'd to home with daughter via w/c to car per [facility] PCA ,stable condition

## 2013-05-19 NOTE — Progress Notes (Addendum)
Pt arrived from endo to pacu. Awake. Resps adequate on RA. VSS. Abdomen soft.

## 2013-05-19 NOTE — Progress Notes (Addendum)
Teaching / education initiated regarding perioperative experience, expectations, and pain management during stay. Patient verbalized understanding.Report given to endoscopy RN to complete hand-off procedure.

## 2013-05-19 NOTE — Op Note (Signed)
Procedures  Preoperative Diagnosis:  GERD    Post-operative Diagnosis: band erosion, erosion into distal esophagus    Procedure: Esophagogastroduodenoscopy     Surgeon: Suzi Roots    ANES:  Diprivan    Complications:  None    EBL:  None    Indications:  The patient has done well with her lap band, but wishes to have a sleeve done to have further weight loss.  I explained all risks and benefits, and she requested to proceed with surgery.      PROCEDURE:  The patient was taken to the endoscopy suite and under monitored conditions was given adequate anesthesia until the patient was sedated.  The endoscope was passed easily into the oropharynx and into the upper esophagus.  The esophagus was completely normal and the z-line was at 40cm.  Just proximal to the GE jxn, there was a small abnormality in the esophagus.  After view, the band had eroded into the distal esophagus.  The scope was passed through the band, the scope entered the stomach easily which distended well.  The band was also, approx 50% eroded into the stomach.  It appears the band has slipped proximally and eroded into the stomach on one side and into the distal esophagus on the other end. The scope was passed through the pylorus and the duodenal bulb and 1st portion of the duodenum were within normal limits.  No abnormalities identified.  The body of the stomach was carefully inspected and there were no abnormalities identified.    The scope was retroflexed and no there was no evidence of a hiatal hernia.  There was no gastritis in the body of the stomach.  The patient tolerated the procedure well and was transferred to the PACU.  I personally performed this procedure.

## 2013-05-19 NOTE — Progress Notes (Signed)
Pt awake. Resps adequate on RA. VSS. Abdomen soft. Pain tolerable. Phase 1 discharge criteria met--seen by anesthesia.

## 2013-05-19 NOTE — H&P (Signed)
Subjective    Subjective    The patient is a 57 y.o. female presenting with a long standing history of GERD.  The patient is in the process of the evaluation regarding weight loss surgery.  Rachael Mays's current Body mass index is Body mass index is 37.42 kg/(m^2).. (@TODAY @).    [name] has band and wants conversion.  Needs to r/o erosion  Weight Loss Program History:  The patient states she has numerous attempts at diet and exercise..       Comorbid Conditions:  Significant diseases affecting this patient are Past Medical History   No diagnosis found.    Review of Systems -as above, otherwise negative        Objective     Physical Exam   Constitutional: she is oriented. Vital signs are normal. she appears well-developed and well-nourished. she is active.   HENT:   Head: Normocephalic.   Neck: No mass and no thyromegaly present.   Cardiovascular: Normal rate, regular rhythm, S1 normal and S2 normal.    Pulmonary/Chest: Effort normal and breath sounds normal.   Abdominal: Soft. Normal appearance. There is no organomegaly. No tenderness. She has no rigidity, no rebound, no guarding and no Murphy's sign.   Musculoskeletal:        Right lower leg: Normal.she  exhibits no tenderness and no edema.        Left lower leg: Normal. she exhibits no tenderness and no edema.   Lymphadenopathy:     she has no cervical adenopathy.   Neurological: she is alert and oriented.   Skin: Skin is warm, dry and intact.   Psychiatric: she has a normal mood and affect. Speech is normal and behavior is normal. Judgment and thought content normal. Cognition and memory are normal.     Assessment    Patient Active Problem List   Diagnosis   ??? Back Pain   ??? Deep Vein Thrombosis   ??? Depression   ??? High Cholesterol   ??? Hypertension   ??? Pulmonary Embolism   ??? Abdominal  Pain, Other Specified Site   ??? Morbid Obesity   ??? Status post gastric banding   ??? Vertigo   ??? Dizziness   ??? Tinnitus, subjective           Plan  I have explained all risk and  benefits of an EGD and the patient understands and requests to proceed.   Plan for EGD today

## 2013-05-19 NOTE — Discharge Instructions (Signed)
Call Dr. Northup for any problems and to schedule followup appt-[phone].    ENDOSCOPY DISCHARGE INSTRUCTIONS     You may experience some lightheadedness for the next several hours.   Plan on quiet relaxation for the rest of today.   A responsible adult needs to stay with you today.   Because of the medications you received today-do not drive,operate machinery,or sign any contractual agreement for the next 24 hours.   Do not drink any alcoholic beverages or take any unprescribed medications tonight.   Eat bland food and avoid anything greasy or spicy initially-progress to your normal diet gradually.   Diet restrictions as instructed.   If you have any of the following problems, notify your physician or return to the hospital emergency room : fever, chills, excessive bleeding, excessive vomiting, difficulty swallowing, uncontrolled pain, increased abdominal distention, shortness of breath or any other problems.   If you had a polyp removed, avoid strenuous activity for 48 hours.Avoid the use of aspirin or related compounds for one week, unless otherwise instructed by your physician.   If you have a sore throat, you may use lozenges or salt water gargles.   If you had a bronchoscopy and you experience sudden or continued shortness of breath, chest pain, spitting up or vomiting blood, notify your physician or return to the hospital emergency room.   Call your doctor for pathology results and/or follow up appointment.    ANESTHESIA DISCHARGE INSTRUCTIONS     Wear your seatbelt home.   You are under the influence of drugs-do not drink alcohol,drive,operate machinery,or make any important decisions or sign any legal documentsfor 24 hours   Children should not ride bikes,skate boards or play on gym sets  for 24 hours after surgery.   A responsible adult needs to be with you for 24 hours.   You may experience lightheadedness,dizziness,or sleepiness following surgery.   Rest at home today- increase activity  as tolerated.   Progress slowly to a regular diet unless your physician has instructed you otherwise.Drink plenty of water.   If nausea becomes a problem call your physician.   Coughing,sore throat,and muscle aches are other side effects of anesthesia,and should improve with time.   Do not drive,operate machinery while taking narcotics.

## 2013-05-19 NOTE — Progress Notes (Signed)
Discharge instructions reviewed and understanding verbalized per pt/family with copy given. All home medications/new prescriptions have been reviewed, questions answered and patient/family state understanding. Medication information sheet provided for new prescriptions received when applicable

## 2013-05-20 MED FILL — XYLOCAINE 2 % IJ SOLN: 2 % | INTRAMUSCULAR | Qty: 5

## 2013-05-20 MED FILL — PROPOFOL 10 MG/ML IV EMUL: 10 MG/ML | INTRAVENOUS | Qty: 20

## 2013-05-25 NOTE — Telephone Encounter (Signed)
Patient is calling wanting to know if Dr. Suzi RootsNorthup found out anything on her band yet. Please call her back at [phone]

## 2013-05-25 NOTE — Telephone Encounter (Signed)
Cieara please call her and explain that he is out of town until next week

## 2013-05-29 NOTE — Telephone Encounter (Signed)
Left a message for patient to call our office

## 2013-05-29 NOTE — Telephone Encounter (Signed)
Patient is wanting to know if Dr. Suzi RootsNorthup found anything out about her band with the EGD. Please call her at [phone]

## 2013-05-30 ENCOUNTER — Encounter

## 2013-05-30 MED ORDER — MONTELUKAST SODIUM 10 MG PO TABS
10 MG | ORAL_TABLET | Freq: Every day | ORAL | Status: DC
Start: 2013-05-30 — End: 2013-12-09

## 2013-05-30 MED ORDER — FLUOCINONIDE 0.05 % EX CREA
0.05 % | CUTANEOUS | Status: DC
Start: 2013-05-30 — End: 2014-03-05

## 2013-05-30 MED ORDER — INTERDRY AG TEXTILE 10"X12' EX MISC
Freq: Every day | CUTANEOUS | Status: DC
Start: 2013-05-30 — End: 2013-05-30

## 2013-05-30 MED ORDER — INTERDRY AG TEXTILE 10"X12' EX MISC
Freq: Every day | CUTANEOUS | Status: DC
Start: 2013-05-30 — End: 2013-09-14

## 2013-05-30 NOTE — Progress Notes (Signed)
The patient is here for a rash on her abdomen.  She says we have treated her for this before but the rash is getting bigger.

## 2013-05-30 NOTE — Telephone Encounter (Signed)
Spoke to patient, she said Dr Suzi Roots was going to speak with some other surgeons for suggestions on this case. I left a message for Dr Suzi Roots to call the patient to discuss options

## 2013-05-30 NOTE — Telephone Encounter (Signed)
Spoke with pt, refaxed rx to [facility]

## 2013-05-30 NOTE — Telephone Encounter (Signed)
LM for pt to call.  I was going to let the pt know we will send the RX to [facility] to see what the cost would be there if that is ok with her.

## 2013-05-30 NOTE — Telephone Encounter (Signed)
How much at [facility]?

## 2013-05-30 NOTE — Telephone Encounter (Signed)
The Interdry AG textile dressing costs $95.  Please send new rx for much cheaper dressing.

## 2013-05-30 NOTE — Progress Notes (Signed)
Subjective:      Patient ID: Rachael Mays is a 57 y.o. female.    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  Nystatin is irritating but gold bond helps.  Rash on anterior abdomen x 2 months.  Topical antifungal did not help.  Scalp gets eczema and lidex helps.      Itchy watery eyes during allergy season.  Zyrtec helps some.      Review of Systems    Objective:   Physical Exam   Constitutional: She is oriented to person, place, and time. She appears well-developed and well-nourished. No distress.   Neurological: She is alert and oriented to person, place, and time.   Skin:   Skin folds with erythema and macerated tissue; right anterior abdomen with approx 2.5cm diameter circular area of erythema; scalp with dry, red tissue at hairline periphery   Psychiatric: She has a normal mood and affect. Her behavior is normal.       Assessment:     1. Allergic rhinitis  montelukast (SINGULAIR) 10 MG tablet   2. Eczema  fluocinonide (LIDEX) 0.05 % cream   3. Intertrigo  Wound Dressings (INTERDRY AG TEXTILE 10"X12') MISC     Plan:     For intertrigo, add interdry to gold bond.  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  Call if no improvement and can try systemic antifungal.  Add singulair for seasonal allergies.

## 2013-05-31 NOTE — Telephone Encounter (Signed)
LM for pt to call

## 2013-05-31 NOTE — Telephone Encounter (Signed)
[facility] called and said they received a prescription for Interdry ag; pharmacy states they cannot get this for the pt and recommends the prescription be sent elsewhere.

## 2013-05-31 NOTE — Telephone Encounter (Signed)
Pt states rx is about $100 at [facility] and she will not pay for this. Is there anything else that can be rx that is less expensive?

## 2013-06-01 NOTE — Telephone Encounter (Signed)
No substitute.  Can find this at online pharmacy--about $30 for a roll.

## 2013-06-01 NOTE — Progress Notes (Signed)
The patient is a 57 y.o. female who returns today for follow up. We discussed how her weight affects her overall health including:  Patient Active Problem List   Diagnosis   ??? Back Pain   ??? Deep Vein Thrombosis   ??? Depression   ??? High Cholesterol   ??? Hypertension   ??? Pulmonary Embolism   ??? Abdominal  Pain, Other Specified Site   ??? Morbid Obesity   ??? Status post gastric banding   ??? Vertigo   ??? Dizziness   ??? Tinnitus, subjective      The patient's current Body mass index is 37.22 kg/(m^2). (06/01/2013).  Since her last visit she has stable / unchanged. Sakia underwent thirty minutes of dietary counseling, and I have reviewed and agree with the dietary counseling, and I have reviewed and agree with the diet plan. There are no changes in the patients medical history or physical exam.     Reviewed her EGD with her.  She has an asymptomatic band erosion.  She has a portion of the band eroded into the esophagus and also into the stomach.  Her band will need to be removed.  I explained that the band erosion in the esophagus is very atypical.  There certainly are increased risks to this band removal more so than typical.  I discussed options with her including stent on the table following removal vs. Get UGI and possible stent in the AM if leak.  Will discuss with GI and plan for band removal.  Also, with the degree of erosion, it may be very difficult to remove and may require gastrotomy.    I spent a total of 35 minutes with the a patient and  Greater than 50% was face to face counseling time with the patient to discuss the issues covered in this note.        The patient will follow up in 1 month

## 2013-06-02 NOTE — Telephone Encounter (Signed)
Notified pt to try online.

## 2013-06-07 ENCOUNTER — Encounter

## 2013-06-07 MED ORDER — LIPOFLAVONOID PO TABS
ORAL_TABLET | ORAL | Status: DC
Start: 2013-06-07 — End: 2013-07-14

## 2013-06-07 MED ORDER — HYDROCHLOROTHIAZIDE 25 MG PO TABS
25 MG | ORAL_TABLET | Freq: Every day | ORAL | Status: DC
Start: 2013-06-07 — End: 2013-07-30

## 2013-06-07 NOTE — Patient Instructions (Addendum)
1. Start Lipoflavonoid 2 tablets three times daily for 60 days, then 1 tablet three times daily.  2. You already are being treated with a diuretic, hydrochlorothiazide (Hidrodiuril), which is the standard treatment for Meniere's syndrome/disease.  This may be blunting the severity of your symptoms.  It is important to continue this therapy.  Please advise me if you are ever taken off of this medication by another physician.

## 2013-06-07 NOTE — Progress Notes (Signed)
REVIEW OF IMAGES:       I reviewed the images of the MRI scan of the brain/head, with contrast, showing no evidence of acoustic neuroma.  There was some mild mucosal thickening in the maxillary sinuses.  I agree with the radiologist's report.      INTERVAL HISTORY:    Rachael Mays stated that she is "the same" as she was at previous visits.  She has had no improvement, but also no worsening of symptoms.  She provided additional history, stating that she experiences a decrease in her hearing during episodes of dizziness.  She has no tinnitus or aural fullness, however.  Occupational:  Home maker.        EXAMINATION:    TMs intact with no perforations      COUNSELING:    I discussed the results of the MRI.  I discussed Meniere's syndrome/disease, natural history, nature of illness, symptoms, and treatment alternatives, especially pharmacologic therapy with a diuretic and lipoflavonoid.  I discussed vestibular rehabilitation/therapy.       IMPRESSION:    1. Meniere syndrome, unspecified laterality     episodic dizziness and fluctuant hearing loss.       RECOMMENDATIONS AND PLAN:  Patient Instructions   1. Start Lipoflavonoid 2 tablets three times daily for 60 days, then 1 tablet three times daily.  2. You already are being treated with a diuretic, hydrochlorothiazide (Hidrodiuril), which is the standard treatment for Meniere's syndrome/disease.  This may be blunting the severity of your symptoms.  It is important to continue this therapy.  Please advise me if you are ever taken off of this medication by another physician.      Total visit time = 25 minutes; Counseling time = 25 minutes.

## 2013-06-08 NOTE — Telephone Encounter (Signed)
Per Jasmine DecemberSharon she called and verified w/ [facility] that band removal ([phone]) NPR for precert.

## 2013-06-12 NOTE — Patient Instructions (Addendum)
Name_______________________________________Printed:____________________  Date and time of surgery__7-30-14______________________Arrival Time:_1000 main_______________   1. Do not eat or drink anything after 12 midnight (or____hours) prior to surgery. This includes no water, chewing gum or mints.   2. Take the following pills with a small sip of water on the morning of surgery____zantac, xanax, albuterol prn_________________________________________________   3. Aspirin, Ibuprofen, Advil, Naproxen, Vitamin E and other Anti-inflammatory products should be stopped for 5 days before surgery or as directed by your physician.   4. Check with your Doctor regarding stopping Plavix, Coumadin,Eliquis, Lovenox,Effient,Pradaxa,Xarelto, Fragmin or other blood thinners and follow their instructions.   5. Do not smoke, and do not drink any alcoholic beverages 24 hours prior to surgery.  This includes NA Beer.   6. You may brush your teeth and gargle the morning of surgery.  DO NOT SWALLOW WATER   7. You MUST make arrangements for a responsible adult to take you home after your surgery. You will not be allowed to leave alone or drive yourself home.  It is strongly suggested someone stay with you the first 24 hrs. Your surgery will be cancelled if you do not have a ride home.   8. A parent/legal guardian must accompany a child scheduled for surgery and plan to stay at the hospital until the child is discharged.  Please do not bring other children with you.   9. Please wear simple, loose fitting clothing to the hospital.  Do not bring valuables (money, credit cards, checkbooks, etc.) Do not wear any makeup (including no eye makeup) or nail polish on your fingers or toes.             10. DO NOT wear any jewelry or piercings on day of surgery.  All body piercing jewelry must be removed.             11. If you have ___dentures, they will be removed before going to the OR; we will provide you a container.  If you wear ___contact lenses or  ___glasses, they will be removed; please bring a case for them.             12. Please see your family doctor/pediatrician for a history & physical and/or concerning medications.  Bring any test results/reports from your physician's office.   PCP__________________Phone___________H&P Appt. Date________             13 If you  have a Living Will and Durable Power of Attorney for Healthcare, please bring in a copy.             14. Notify your Surgeon if you develop any illness between now and surgery  time, cough, cold, fever, sore throat, nausea, vomiting, etc.  Please notify your surgeon if you experience dizziness, shortness of breath or blurred vision between now & the time of your surgery             15. DO NOT shave your operative site 96 hours prior to surgery. For face & neck surgery, men may use an electric razor 48 hours prior to surgery.             16. Shower the night before surgery with ___Antibacterial soap ___Hibiclens             17. To provide excellent care visitors will be limited to one in the room at any given time.             18.  Please bring picture ID and insurance card.  19.  Visit our web site for additional information:  [email or web address]              20.During flu season no children under the age of 47 are permitted in the hospital for the safety of all patients.                              21. If you take a long acting insulin in the evening only  take half of your usual  dose the night  before your procedure              22. If you use a c-pap please bring DOS if staying overnight,             23.For your convenience [facility] has a pharmacy on site to fill your prescriptions.             24. If you use oxygen and have a portable tank please bring it  with you the DOS             25. Other ____________________________________________     *Please call pre admission testing if you any further questions   [facility]         [place]   [address]    [address]  [phone]   [place]       All above information reviewed with patient in person or by phone.Patient verbalizes understanding.All questions and concerns addressed.                                                                                                 Patient/Rep____________________                                                                                                                                    PRE OP INSTRUCTIONS

## 2013-06-13 NOTE — Telephone Encounter (Signed)
Pt called in regards to surgery tomorrow. Pt states she was not seen @ her appt yesterday. Pt has questions regarding surgery and wanted to know if CJN has talked to the GI Dr yet? I gave information to Thurston Holenne and CJN will contact pt.

## 2013-06-27 LAB — TYPE AND SCREEN
ABO/Rh: A NEG
Antibody Screen: NEGATIVE

## 2013-06-27 LAB — CBC WITH AUTO DIFFERENTIAL
Basophils %: 0.5 %
Basophils Absolute: 0 10*3/uL (ref 0.0–0.2)
Eosinophils %: 4.3 %
Eosinophils Absolute: 0.3 10*3/uL (ref 0.0–0.6)
Hematocrit: 38.9 % (ref 36.0–48.0)
Hemoglobin: 13.2 g/dL (ref 12.0–16.0)
Lymphocytes %: 32.8 %
Lymphocytes Absolute: 2.6 10*3/uL (ref 1.0–5.1)
MCH: 28.7 pg (ref 26.0–34.0)
MCHC: 34 g/dL (ref 31.0–36.0)
MCV: 84.5 fL (ref 80.0–100.0)
MPV: 9.9 fL (ref 5.0–10.5)
Monocytes %: 8.6 %
Monocytes Absolute: 0.7 10*3/uL (ref 0.0–1.3)
Neutrophils %: 53.8 %
Neutrophils Absolute: 4.2 10*3/uL (ref 1.7–7.7)
Platelets: 184 10*3/uL (ref 135–450)
RBC: 4.61 M/uL (ref 4.00–5.20)
RDW: 13.9 % (ref 12.4–15.4)
WBC: 7.8 10*3/uL (ref 4.0–11.0)

## 2013-06-28 LAB — COMPREHENSIVE METABOLIC PANEL
ALT: 27 U/L (ref 10–40)
AST: 24 U/L (ref 15–37)
Albumin/Globulin Ratio: 1.3 (ref 1.1–2.2)
Albumin: 3.9 g/dL (ref 3.4–5.0)
Alkaline Phosphatase: 90 U/L (ref 40–129)
BUN: 16 mg/dL (ref 7–20)
CO2: 27 mmol/L (ref 21–32)
Calcium: 9.6 mg/dL (ref 8.3–10.6)
Chloride: 102 mmol/L (ref 99–110)
Creatinine: 0.8 mg/dL (ref 0.6–1.1)
GFR African American: >60 (ref >60)
GFR Non-African American: >60 (ref >60)
Globulin: 3.1 g/dL
Glucose: 106 mg/dL — ABNORMAL HIGH (ref 70–99)
Potassium: 3.6 mmol/L (ref 3.5–5.1)
Sodium: 140 mmol/L (ref 136–145)
Total Bilirubin: 0.4 mg/dL (ref 0.0–1.0)
Total Protein: 7 g/dL (ref 6.4–8.2)

## 2013-06-28 LAB — EKG 12-LEAD
Atrial Rate: 60 {beats}/min
P Axis: 77 degrees
P-R Interval: 126 ms
Q-T Interval: 450 ms
QRS Duration: 72 ms
QTc Calculation (Bazett): 450 ms
R Axis: -10 degrees
T Axis: 25 degrees
Ventricular Rate: 60 {beats}/min

## 2013-06-29 NOTE — Progress Notes (Signed)
Preoperative Consultation    Rachael Mays  Date of Birth:  1956-03-04  Date of Service:  06/29/2013    Chief Complaint   Patient presents with   ??? Pre-op Exam       This patient presents to the office today for a preoperative consultation at the request of surgeon, Dr. Suzi Roots, who plans on performing lap band removal on July 30 at [facility].  On prep EGD for a desired gastric sleeve, Dr. Suzi Roots discovered that the current lap band had eroded into the esophagus.  Removal of this is planned by Dr. Suzi Roots with the cooperation of Dr. Daphine Deutscher, GI.      Patient reports reflux symptoms and malaise.  Her hearing loss and ringing in ears if followed by ENT and improved with lipoflavenoid.    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 Back Pain   ??? Deep Vein Thrombosis   ??? Depression   ??? High Cholesterol   ??? Hypertension   ??? Pulmonary Embolism   ??? Abdominal  Pain, Other Specified Site   ??? Morbid Obesity   ??? Status post gastric banding   ??? Vertigo   ??? Dizziness   ??? Tinnitus, subjective   ??? Meniere syndrome       Past Surgical History   Procedure Laterality Date   ??? Colonoscopy     ??? Upper gastrointestinal endoscopy     ??? Cesarean section     ??? Cholecystectomy     ??? Hysterectomy     ??? Shoulder surgery     ??? Lap band  05/01/08     Dr. Suzi Roots   ??? Back surgery       neck  plates   ??? Colonoscopy  04/08/10   ??? Spine surgery     ??? Other surgical history       [place] Filter: curently in place   ??? Upper gastrointestinal endoscopy  05/19/13     Current Outpatient Prescriptions   Medication Sig Dispense Refill   ??? hydrochlorothiazide (HYDRODIURIL) 25 MG tablet Take 1 tablet by mouth daily.  90 tablet  1   ??? Vitamins-Lipotropics  (LIPOFLAVONOID) TABS Take two tablets by mouth three times a day for 60 days, then one tablet three times a day.  60 tablet  0   ??? ranitidine (ZANTAC) 150 MG tablet Take 150 mg by mouth 2 times daily.       ??? montelukast (SINGULAIR) 10 MG tablet Take 1 tablet by mouth daily.  30 tablet  3   ??? fluocinonide (LIDEX) 0.05 % cream Apply topically 2 times daily.  1 Tube  1   ??? Wound Dressings (INTERDRY AG TEXTILE 10"X12') MISC Apply 1 each topically daily. Pt would like a call with price before filled  1 each  1   ??? gabapentin (NEURONTIN) 300 MG capsule TAKE ONE CAPSULE BY MOUTH NIGHTLY  90 capsule  0   ??? HYDROcodone-acetaminophen (NORCO) 5-325 MG per tablet Take 1 tablet by mouth every 4 hours as needed for Pain.  30 tablet  0   ??? cetirizine (ZYRTEC ALLERGY) 10 MG tablet Take 10 mg by mouth daily.       ???  ALPRAZolam (XANAX) 0.5 MG tablet Take 1 tablet by mouth 2 times daily as needed for Sleep or Anxiety.  60 tablet  0   ??? escitalopram (LEXAPRO) 20 MG tablet Take 20 mg by mouth nightly.       ??? calcium carbonate 600 MG TABS tablet Take 1 tablet by mouth 2 times daily.         ??? Vitamin D (CHOLECALCIFEROL) 1000 UNITS CAPS capsule Take 1,000 Units by mouth 2 times daily.         ??? Multiple Vitamin (MULTIVITAMIN PO) Take 1 tablet by mouth 2 times daily.       ??? albuterol (PROVENTIL;VENTOLIN) 90 MCG/ACT inhaler Inhale 2 puffs into the lungs every 6 hours as needed.         No current facility-administered medications for this visit.     Allergies   Allergen Reactions   ??? Nystatin Other (See Comments)     "welts"       ??? Nystatin Other (See Comments)     Yeast infection     ??? Talwin [Pentazocine] Other (See Comments)     dizzy   ??? Pentazocine Lactate Rash     TALWIN     No outpatient prescriptions have been marked as taking for the 06/29/13 encounter (Office Visit) with Lum Keas, MD.       History   Substance Use Topics   ??? Smoking status: Former Smoker -- 0.50 packs/day for 35 years     Types: Cigarettes      Quit date: 08/07/2012   ??? Smokeless tobacco: Never Used   ??? Alcohol Use: No       Review of Systems  A comprehensive review of systems was negative except for what was noted in the HPI.     Physical Exam   There were no vitals taken for this visit.      Constitutional: She is oriented to person, place, and time. She appears well-developed and well-nourished. No distress.   HENT:   Head: Normocephalic and atraumatic.   Mouth/Throat: Uvula is midline, oropharynx is clear and moist and mucous membranes are normal.   Eyes: Conjunctivae and EOM are normal. Pupils are equal, round, and reactive to light.   Neck: Trachea normal and normal range of motion. Neck supple.  Carotid bruit is not present. No mass and no thyromegaly present.   Cardiovascular: Normal rate, regular rhythm, normal heart sounds and intact distal pulses.  Exam reveals no gallop and no friction rub.  No murmur heard.  Pulmonary/Chest: Effort normal and breath sounds normal. No respiratory distress. She has no wheezes. She has no rales.   Abdominal: Soft. Normal aorta and bowel sounds are normal. She exhibits no distension and no mass. There is no hepatosplenomegaly. No tenderness.   Musculoskeletal: She exhibits no edema and no tenderness.   Neurological: She is alert and oriented to person, place, and time. She has normal strength. No cranial nerve deficit or sensory deficit. Coordination and gait normal.   Skin: Skin is warm and dry. No rash noted. No erythema.     EKG Interpretation:  Sinus rhythm, rate 60 bpm with PACs     Assessment:       There are no diagnoses linked to this encounter.  57 y.o. patient      1. Preop examination    2. Pulmonary Embolism    3. Hypertension    4. Morbid obesity    5. Status post gastric banding  6. Meniere syndrome, unspecified laterality         Plan:     1. Preoperative workup as follows: ECG  2. Change in medication regimen before surgery:  Hold lipoflavenoid until after surgery  3. No contraindications to  planned surgery.  Will call Dr. Suzi Roots to discuss extended DVT prophylaxis after surgery due to history of DVT and PE.  Would recommend 4 weeks of prophylactic Lovenox postop.

## 2013-06-30 LAB — LIPID PANEL
Cholesterol, Total: 217 mg/dL — ABNORMAL HIGH (ref 0–199)
HDL: 60 mg/dL (ref 40–60)
LDL Calculated: 138 mg/dL — ABNORMAL HIGH (ref <100)
Triglycerides: 95 mg/dL (ref 0–150)
VLDL Cholesterol Calculated: 19 mg/dL

## 2013-06-30 NOTE — Telephone Encounter (Signed)
Dr. Mack HookSlough left a voicemail regarding this patient, she has band removal scheduled on 07/05/13. Dr. Mack HookSlough said the patient has history of DVT and would like to speak with Dr. Suzi RootsNorthup about her. Dr. Mack HookSlough phone number is [phone] office and her cell is [phone]

## 2013-07-03 NOTE — Telephone Encounter (Signed)
Dr. Suzi Roots notified

## 2013-07-05 ENCOUNTER — Inpatient Hospital Stay
Admit: 2013-07-05 | Disposition: A | Discharge status: Home / Self Care | Source: Ambulatory Visit | Attending: Surgery | Admitting: Surgery

## 2013-07-05 MED ORDER — LIDOCAINE HCL (PF) 1 % IJ SOLN
1 | Freq: Once | INTRAMUSCULAR | Status: DC | PRN
Start: 2013-07-05 — End: 2013-07-05

## 2013-07-05 MED ORDER — DIPHENHYDRAMINE HCL 50 MG/ML IJ SOLN
50 | Freq: Once | INTRAMUSCULAR | Status: DC | PRN
Start: 2013-07-05 — End: 2013-07-05

## 2013-07-05 MED ORDER — FENTANYL CITRATE 0.05 MG/ML IJ SOLN
0.05 | INTRAMUSCULAR | Status: DC | PRN
Start: 2013-07-05 — End: 2013-07-05

## 2013-07-05 MED ORDER — HYDROMORPHONE HCL PF 2 MG/ML IJ SOLN
2 | INTRAMUSCULAR | Status: DC | PRN
Start: 2013-07-05 — End: 2013-07-05
  Administered 2013-07-05 (×2): 0.25 mg via INTRAVENOUS

## 2013-07-05 MED ORDER — HYDROCODONE-ACETAMINOPHEN 5-325 MG PO TABS
5-325 | ORAL | Status: DC | PRN
Start: 2013-07-05 — End: 2013-07-05

## 2013-07-05 MED ORDER — ACETAMINOPHEN 325 MG PO TABS
325 | ORAL | Status: DC | PRN
Start: 2013-07-05 — End: 2013-07-16

## 2013-07-05 MED ADMIN — diphenhydrAMINE (BENADRYL) injection 25 mg: INTRAVENOUS | NDC 63323066401

## 2013-07-05 MED ADMIN — ceFAZolin (ANCEF) 2 g in dextrose 3% 50mL IVPB (duplex): INTRAVENOUS | @ 12:00:00 | NDC 00264310511

## 2013-07-05 MED ADMIN — lidocaine 1 % (PF) injection 0.5 mL: INTRADERMAL | @ 11:00:00 | NDC 63323020102

## 2013-07-05 MED ADMIN — enoxaparin (LOVENOX) injection 30 mg: SUBCUTANEOUS | NDC 00075801301

## 2013-07-05 MED ADMIN — scopolamine (TRANSDERM-SCOP) 1.5 MG 1 patch: TRANSDERMAL | @ 11:00:00 | NDC 10019055388

## 2013-07-05 MED ADMIN — ondansetron (ZOFRAN) 4 MG/2ML injection: INTRAVENOUS | @ 14:00:00 | NDC 23155037831

## 2013-07-05 MED ADMIN — enoxaparin (LOVENOX) injection 30 mg: SUBCUTANEOUS | @ 11:00:00 | NDC 00075801301

## 2013-07-05 MED ADMIN — HYDROmorphone (DILAUDID) 10 mg/50 mL PCA: INTRAVENOUS | @ 14:00:00 | NDC 00000000124

## 2013-07-05 MED ADMIN — lactated ringers infusion: INTRAVENOUS | @ 11:00:00 | NDC 00338011704

## 2013-07-05 MED ADMIN — lactated ringers infusion: INTRAVENOUS | @ 19:00:00 | NDC 00338011704

## 2013-07-05 MED ADMIN — lactated ringers infusion: INTRAVENOUS | @ 14:00:00 | NDC 00338011704

## 2013-07-05 MED ADMIN — ondansetron (ZOFRAN) injection 4 mg: INTRAVENOUS | NDC 23155037831

## 2013-07-05 MED ADMIN — promethazine (PHENERGAN) injection 6.25 mg: INTRAVENOUS | @ 15:00:00 | NDC 00641092821

## 2013-07-05 MED FILL — DIPHENHYDRAMINE HCL 50 MG/ML IJ SOLN: 50 MG/ML | INTRAMUSCULAR | Qty: 1

## 2013-07-05 MED FILL — NORMAL SALINE FLUSH 0.9 % IV SOLN: 0.9 % | INTRAVENOUS | Qty: 10

## 2013-07-05 MED FILL — CEFAZOLIN SODIUM-DEXTROSE 2-3 GM-% IV SOLR: 2-3 GM-% | INTRAVENOUS | Qty: 2

## 2013-07-05 MED FILL — LOVENOX 30 MG/0.3ML SC SOLN: 30 MG/0.3ML | SUBCUTANEOUS | Qty: 0.3

## 2013-07-05 MED FILL — LACTATED RINGERS IV SOLN: INTRAVENOUS | Qty: 1000

## 2013-07-05 MED FILL — BUPIVACAINE-EPINEPHRINE (PF) 0.25-1:200000 % IJ SOLN: INTRAMUSCULAR | Qty: 30

## 2013-07-05 MED FILL — TRANSDERM-SCOP (1.5 MG) 1 MG/3DAYS TD PT72: 1 MG/3DAYS | TRANSDERMAL | Qty: 1

## 2013-07-05 MED FILL — HYDROMORPHONE 0.2 MG/ML 50 ML PCA: Qty: 50

## 2013-07-05 MED FILL — HYDROMORPHONE HCL PF 2 MG/ML IJ SOLN: 2 MG/ML | INTRAMUSCULAR | Qty: 1

## 2013-07-05 MED FILL — NORMAL SALINE FLUSH 0.9 % IV SOLN: 0.9 % | INTRAVENOUS | Qty: 30

## 2013-07-05 MED FILL — ONDANSETRON HCL 4 MG/2ML IJ SOLN: 4 MG/2ML | INTRAMUSCULAR | Qty: 2

## 2013-07-05 MED FILL — PROMETHAZINE HCL 25 MG/ML IJ SOLN: 25 MG/ML | INTRAMUSCULAR | Qty: 1

## 2013-07-05 NOTE — Progress Notes (Signed)
Room ready, pt. Stable, pain controlled with PCA, continues with some nausea, pt. States it is tolerable, floor notified of transfer

## 2013-07-05 NOTE — Progress Notes (Signed)
Medicated with zofran, benadryl.  Titrated oxygen off.  Patient 94% on room air.  Family at bedside.

## 2013-07-05 NOTE — Progress Notes (Signed)
Patient assessment, H&P, problem list, and check list has been reviewed.

## 2013-07-05 NOTE — Progress Notes (Signed)
Patient arrived to 4tower at 1300 from PACU, A&Ox4, sleeping but arouses easily, on 2 L NC, end tital co2 monitor on, pca dilaudid infusing prn, LR 250 x6 hours then was decreased to 125 ml/hr at 1600.  Oriented patient to room and call light, family at bedside.  Assisted patient to get out of bed and walk room, voided 300 ml, tolerated ambulation well.  Abdomen soft with 3 lap sites intact with dermabond no drainage, JP to right side of abdomen to bulb suction with minimal output.  Showed her how to use IS.  Bed alarm on, call light in reach.

## 2013-07-05 NOTE — Plan of Care (Signed)
Problem: SAFETY  Goal: Free from accidental physical injury  Outcome: Ongoing  D:  Patient continues to be a fall risk  A:  Bed alarm on, reminded to call before ambulation  R:  Patient calls before ambulation, Will continue to assess fall risk status and report changes.          Problem: PAIN  Goal: Patient???s pain/discomfort is manageable  Outcome: Ongoing  Patient c/o abdominal pain, on dilaudid pca pump, well controlled.  Will continue to assess and report changes.

## 2013-07-05 NOTE — Progress Notes (Addendum)
Teaching / education initiated regarding perioperative experience, expectations, and pain management during stay. Patient verbalized understanding.Possible EGD added to consent, pt. Agreeable.Report given to OR RN to complete hand-off procedure.

## 2013-07-05 NOTE — Progress Notes (Signed)
Assisted patient to bathroom and back to bed, ast x1 with daughter standby assist.  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  Will ambulate in hall tonight.  Requesting nausea and itching medicines.

## 2013-07-05 NOTE — Progress Notes (Signed)
Family updated

## 2013-07-05 NOTE — H&P (Signed)
Subjective    The patient Rachael Mays is a 57 y.o. female presenting on day of surgery for band removal.  Patient has band erosion into stomach and esphagus. The patient has completed all of the pre-operative evaluation and is felt to be a good candidate for surgery by our multidisciplinary team.  I have explained all risks/benefits of the surgical procedure and answered all questions.  The patient understands and has requested to proceed with surgery.  There are no changes to HPI, PMH, other history or physical exam from the original H&P. The patient's current Body mass index is Body mass index is 37.66 kg/(m^2).           Comorbid Conditions:    Past Medical History   Diagnosis Date   ??? Anxiety    ??? Asthma    ??? Obesity    ??? Chronic back pain    ??? Deep vein thrombosis    ??? Depression    ??? GERD (gastroesophageal reflux disease)      NO LONGER SINCE LAP   ??? Pulmonary embolism (HCC)    ??? Obesity      hx of; had lap band   ??? Hyperlipidemia      hx; resolved with lap band   ??? Back pain 01/23/2009   ??? GERD (gastroesophageal reflux disease) 01/23/2009   ??? HIGH CHOLESTEROL 01/23/2009   ??? Hypertension    ??? Hypertension          Past Surgical History   Procedure Laterality Date   ??? Colonoscopy     ??? Upper gastrointestinal endoscopy     ??? Cesarean section     ??? Cholecystectomy     ??? Hysterectomy     ??? Shoulder surgery     ??? Lap band  05/01/08     Dr. Suzi Roots   ??? Back surgery       neck  plates   ??? Colonoscopy  04/08/10   ??? Spine surgery     ??? Other surgical history       [place] Filter: curently in place   ??? Upper gastrointestinal endoscopy  05/19/13       Allergies   Allergen Reactions   ??? Nystatin Other (See Comments)     Yeast infection     ??? Talwin [Pentazocine] Other (See Comments)     dizzy       Family History   Problem Relation Age of Onset   ??? Arthritis Mother    ??? Cancer Mother    ??? Depression Mother    ??? Heart Disease Mother    ??? High Blood Pressure Mother    ??? Heart Disease Maternal Grandmother    ??? Early Death  Paternal Grandmother    ??? Heart Disease Paternal Grandfather    ??? Diabetes Other    ??? High Blood Pressure Other    ??? Obesity Other    ??? Heart Disease Mother 46     MI    ??? Ovarian Cancer Mother 62   ??? Cancer Father 39     colon           Objective     Physical Exam   Constitutional: she is oriented. Vital signs are normal. she appears well-developed and well-nourished. she is active.   HENT:   Head: Normocephalic.   Neck: No mass and no thyromegaly present.   Cardiovascular: Normal rate, regular rhythm, S1 normal and S2 normal.    Pulmonary/Chest: Effort normal and  breath sounds normal.   Abdominal: Soft. Normal appearance. There is no organomegaly. No tenderness. she has no rigidity, no rebound, no guarding and no Murphy's sign.   Musculoskeletal:        Right lower leg: Normal. she exhibits no tenderness and no edema.        Left lower leg: Normal. she exhibits no tenderness and no edema.   Lymphadenopathy:     she has no cervical adenopathy.   Neurological: she is alert and oriented.   Skin: Skin is warm, dry and intact.   Psychiatric: she has a normal mood and affect. she speech is normal and behavior is normal. Judgment and thought content normal. Cognition and memory are normal.     Assessment    Patient Active Problem List     Patient Active Problem List   Diagnosis   ??? Back Pain   ??? Deep Vein Thrombosis   ??? Depression   ??? High Cholesterol   ??? Hypertension   ??? Pulmonary Embolism   ??? Abdominal  Pain, Other Specified Site   ??? Morbid Obesity   ??? Status post gastric banding   ??? Vertigo   ??? Dizziness   ??? Tinnitus, subjective   ??? Meniere syndrome         Plan  Have had discussion with patient and w/ GI re: her erosion.  The plan will be to remove the band today and get UGI in AM.  If leak does not seal, will plan for stent in AM.  Discussed other option of stent during surgery, but she may not need a stent and could put her at risk of stent complication.  She and her family understands and requests to proceed with  surgery    Proceed with Bariatric Surgery

## 2013-07-05 NOTE — Progress Notes (Signed)
From OR arousable, respirations easy, abdomen soft, having nausea and pain, medicated by CRNA

## 2013-07-05 NOTE — Anesthesia Pre-Procedure Evaluation (Addendum)
Rachael Mays     Anesthesia Evaluation     Patient summary reviewed    No history of anesthetic complications   Airway   Mallampati: II  TM distance: >3 FB  Neck ROM: full  Comment: Mouth opening 3 FB  Dental - normal exam     Pulmonary - normal exam    breath sounds clear to auscultation  (+) asthma (uses albuterol 2 times per month, last attack 1 month ago,  inhaler this AM),   (-) pneumonia, COPD, shortness of breath, recent URI, sleep apnea  ROS comment: H/O PE  Cardiovascular - normal exam  Exercise tolerance: good  (+) hypertension well controlled, dysrhythmias (PVC),   (-) pacemaker, valvular problems/murmurs, past MI, CAD, CABG/stent, angina, CHF, orthopnea, PND, DOE, murmur    ECG reviewed  Rhythm: irregular  Rate: normal  ROS comment: Cath 2013 nrl coronary arteries  Patient states she wore halter monitor for a month following cath and was ok  2012 NM EF 55% WNL  ROS comment: HLD,Chronic Chest pain with negative cardiac workup,Cath 2013 nrl coronary arteries  Patient states she wore halter monitor for a month following cath and was ok  2012 NM EF 55% WNL  ECHO 2010 Normal function with LVH  Beta Blocker:  Not on Beta Blocker    Neuro/Psych    (+) seizures well controlled, psychiatric history (Depression)  (-) neuromuscular disease, TIA, CVA, headaches  Comments: Meneires DiseaseS/P ACDF B/L,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  Now on gabapentin.  No episodes since  GI/Hepatic/Renal    (+) GERD (omeprazole taken today) well controlled,   (-) PUD, hepatitis, liver disease, renal disease    Comments: Possible esophageal ulcer    Endo/Other - negative ROS   Abdominal   (+) obese,                     Allergies: Nystatin and Talwin    NPO Status: more than 8 hrs                           Anesthesia Plan    ASA 3     general   (Plan:GA, risks and benefits discussed;standard ASA monitor will be used; antiemetics and IV Narcotic will be used to control PONV and postoperative pain respectively; patient will be  recovered in PACU.  )  intravenous induction   Anesthetic plan and risks discussed with patient.  Use of blood products discussed with patient whom consented to blood products.   Plan discussed with CRNA.          Ciara Kagan Militante-Hilvano  07/05/2013

## 2013-07-05 NOTE — Progress Notes (Signed)
Patient desired to walk.  Patient was assisted up to standing position.  After assessing patient's ability to stand and ambulate safely, patient cleared to walk with daughters x2.  Patient tolerated x2 complete laps in hall, to bathroom and back to bed.

## 2013-07-05 NOTE — Anesthesia Post-Procedure Evaluation (Signed)
Anesthesia Post-op Note    Patient: Rachael Mays    Procedure(s) Performed: Lap Gastric Band Removal    Anesthesia type: General    Patient location: PACU    Post-op pain: Adequate analgesia    Post-op assessment: no apparent anesthetic complications    Last Vitals: BP 111/72   Pulse 83   Temp(Src) 97.6 ??F (36.4 ??C) (Oral)   Resp 16   Wt 233 lb 4 oz (105.8 kg)   BMI 37.66 kg/m2   SpO2 96%    Post-op vital signs: stable    Aldrete Phase I: Aldrete Score: 8    Aldrete Phase II:      Post-op Nausea & Vomiting: no nausea and no vomiting    Post-op Hydration: euvolemic    Level of consciousness: awake, alert  and oriented    Complications: none    Ashley Royalty, MD  2:43 PM

## 2013-07-05 NOTE — Op Note (Signed)
Brief Postoperative Note    Rachael Mays is a 57 y.o. female        Pre-operative Diagnosis: band erosion    Post-operative Diagnosis: Same    Procedure/Anesthesia: Laparoscopic Adjustable Band Removal w/ sub q components, lysis of adhesions,    Surgeons/Assistants: Darrall Dears    Estimated Blood Loss: Minimal    Complications: none    INDICATIONS  She had previous band placed and was identified to have a band erosion into the stomach and esophagus by previous workup  Explained she had increased risk of complication due to previous surgery. I had explained all of the risk, benefits and possible complications.  The patient understood and requested to proceed with the procedure.    PROCEDURE    The patient was given appropriate doses of antibiotics and lovenox.  The patient was taken to the OR and placed in a supine position then prepped and draped in a sterile fashion.  A left upper quadrant incision was made and a veres needle was inserted.  After adequate pneumoperitoneum was obtained, a 5mm trocar was inserted.  This was followed by a endoscope which confirmed intra-abdominal placement and there was no injury on initial trocar placement.  Additional ports were placed in the standard positions under direct vision.    A significant amount of dissection was done around the stomach.  There was an intense reaction around the stomach and an extensive adhesiolysis was done which took >75% of the case time.  The tubing was tracked back to the area of the band.  The scar was taken down until the band could be identified.  The band was eventually identified after further dissection. The buckle was identified and transected.  The band was removed and removed from the abdomen.  The anatomy was very difficult to identify due to the inflammation and I could not for certain identify a hold in the stomach.  There was a very raw area that was over sewn with multiple interrupted vicry sutures. Due to the significant  scarring I could not identify the area of the esophagus to see any anatomy in this area.  A drain was left in place.    There was no bleeding.  The band was removed completely under direct vision.  The abdomen was slightly deflated and the ports were removed under direct vision    The incision was opened and the port was removed with cautery.  All parts of the band/tubing/port were removed.    The subcutaneous tissue was closed with 3-0 vicryl and the skin was closed with 4-0 vicryl.  Dermabond was applied.  The patient tolerated the procedure well, all counts were correct and I personally performed this procedure.

## 2013-07-05 NOTE — Progress Notes (Signed)
VSS, respirations easy, abdomen soft, medicated for pain and nausea, seen by anesthesia, meets criteria for pacu discharge

## 2013-07-06 LAB — CBC WITH AUTO DIFFERENTIAL
Basophils %: 0.2 %
Basophils Absolute: 0 10*3/uL (ref 0.0–0.2)
Eosinophils %: 0 %
Eosinophils Absolute: 0 10*3/uL (ref 0.0–0.6)
Hematocrit: 36.3 % (ref 36.0–48.0)
Hemoglobin: 12 g/dL (ref 12.0–16.0)
Lymphocytes %: 9.1 %
Lymphocytes Absolute: 1.3 10*3/uL (ref 1.0–5.1)
MCH: 28.4 pg (ref 26.0–34.0)
MCHC: 33.1 g/dL (ref 31.0–36.0)
MCV: 85.8 fL (ref 80.0–100.0)
MPV: 8.9 fL (ref 5.0–10.5)
Monocytes %: 7 %
Monocytes Absolute: 1 10*3/uL (ref 0.0–1.3)
Neutrophils %: 83.7 %
Neutrophils Absolute: 11.6 10*3/uL — ABNORMAL HIGH (ref 1.7–7.7)
Platelets: 184 10*3/uL (ref 135–450)
RBC: 4.23 M/uL (ref 4.00–5.20)
RDW: 14.7 % (ref 12.4–15.4)
WBC: 13.9 10*3/uL — ABNORMAL HIGH (ref 4.0–11.0)

## 2013-07-06 LAB — COMPREHENSIVE METABOLIC PANEL
ALT: 81 U/L — ABNORMAL HIGH (ref 10–40)
AST: 72 U/L — ABNORMAL HIGH (ref 15–37)
Albumin/Globulin Ratio: 1.1 (ref 1.1–2.2)
Albumin: 3.5 g/dL (ref 3.4–5.0)
Alkaline Phosphatase: 86 U/L (ref 40–129)
BUN: 18 mg/dL (ref 7–20)
CO2: 33 mmol/L — ABNORMAL HIGH (ref 21–32)
Calcium: 9.2 mg/dL (ref 8.3–10.6)
Chloride: 103 mmol/L (ref 99–110)
Creatinine: 0.8 mg/dL (ref 0.6–1.1)
GFR African American: >60 (ref >60)
GFR Non-African American: >60 (ref >60)
Globulin: 3.1 g/dL
Glucose: 115 mg/dL — ABNORMAL HIGH (ref 70–99)
Potassium: 4.2 mmol/L (ref 3.5–5.1)
Sodium: 141 mmol/L (ref 136–145)
Total Bilirubin: 1 mg/dL (ref 0.0–1.0)
Total Protein: 6.6 g/dL (ref 6.4–8.2)

## 2013-07-06 MED ORDER — OXYCODONE-ACETAMINOPHEN 5-325 MG/5ML PO SOLN
5-325 | ORAL | Status: DC | PRN
Start: 2013-07-06 — End: 2013-07-16
  Administered 2013-07-07: 23:00:00 5 mL via ORAL

## 2013-07-06 MED ADMIN — lactated ringers infusion: INTRAVENOUS | @ 17:00:00 | NDC 00338011704

## 2013-07-06 MED ADMIN — enoxaparin (LOVENOX) injection 30 mg: SUBCUTANEOUS | @ 12:00:00 | NDC 00075801301

## 2013-07-06 MED ADMIN — lactated ringers infusion: INTRAVENOUS | @ 01:00:00 | NDC 00338011704

## 2013-07-06 MED ADMIN — diphenhydrAMINE (BENADRYL) injection 25 mg: INTRAVENOUS | @ 15:00:00 | NDC 00641037621

## 2013-07-06 MED ADMIN — iohexol (OMNIPAQUE 350) solution 50 mL: @ 12:00:00 | NDC 00407141489

## 2013-07-06 MED ADMIN — diphenhydrAMINE (BENADRYL) injection 25 mg: INTRAVENOUS | @ 21:00:00 | NDC 00641037621

## 2013-07-06 MED ADMIN — diphenhydrAMINE (BENADRYL) injection 25 mg: INTRAVENOUS | @ 07:00:00 | NDC 00641037621

## 2013-07-06 MED ADMIN — HYDROmorphone (DILAUDID) 10 mg/50 mL PCA: INTRAVENOUS | @ 17:00:00 | NDC 00000000124

## 2013-07-06 MED ADMIN — promethazine (PHENERGAN) injection 12.5 mg: INTRAVENOUS | @ 12:00:00 | NDC 00641092821

## 2013-07-06 MED ADMIN — oxyCODONE-acetaminophen (ROXICET) 5-325 MG/5ML solution 10 mL: 10 mL | ORAL | @ 22:00:00 | NDC 00054864816

## 2013-07-06 MED FILL — PROMETHAZINE HCL 25 MG/ML IJ SOLN: 25 MG/ML | INTRAMUSCULAR | Qty: 1

## 2013-07-06 MED FILL — LACTATED RINGERS IV SOLN: INTRAVENOUS | Qty: 1000

## 2013-07-06 MED FILL — HYDROMORPHONE 0.2 MG/ML 50 ML PCA: Qty: 50

## 2013-07-06 MED FILL — DIPHENHYDRAMINE HCL 50 MG/ML IJ SOLN: 50 MG/ML | INTRAMUSCULAR | Qty: 1

## 2013-07-06 MED FILL — MIDAZOLAM HCL 2 MG/2ML IJ SOLN: 2 MG/ML | INTRAMUSCULAR | Qty: 2

## 2013-07-06 MED FILL — FENTANYL CITRATE 0.05 MG/ML IJ SOLN: 0.05 MG/ML | INTRAMUSCULAR | Qty: 5

## 2013-07-06 MED FILL — HYDROMORPHONE HCL PF 2 MG/ML IJ SOLN: 2 MG/ML | INTRAMUSCULAR | Qty: 1

## 2013-07-06 MED FILL — ROXICET 5-325 MG/5ML PO SOLN: 5-325 MG/5ML | ORAL | Qty: 10

## 2013-07-06 MED FILL — NORMAL SALINE FLUSH 0.9 % IV SOLN: 0.9 % | INTRAVENOUS | Qty: 10

## 2013-07-06 MED FILL — LOVENOX 30 MG/0.3ML SC SOLN: 30 MG/0.3ML | SUBCUTANEOUS | Qty: 0.3

## 2013-07-06 NOTE — Progress Notes (Signed)
New order from Dr.Northup.  Patient provided with clear liquids.

## 2013-07-06 NOTE — Progress Notes (Signed)
Phenergan given ivp, see MAR for nausea.  Will monitor.

## 2013-07-06 NOTE — Progress Notes (Signed)
Patient to bathrooma nd back up in hall.  Will check on benedryl again.

## 2013-07-06 NOTE — Progress Notes (Signed)
paitent sent to xray in wheelchair.

## 2013-07-06 NOTE — Progress Notes (Signed)
Patient suspected IV leaking. Iv redressed, no leaking present. No s/s infiltration. Will monitor

## 2013-07-06 NOTE — Progress Notes (Signed)
Benadryl given ivp, see MAR for itching.  patient bathed per daughter.  No further needs.

## 2013-07-06 NOTE — Anesthesia Post-Procedure Evaluation (Signed)
Anesthesia Post-op Note    Patient: Rachael Mays    Procedure(s) Performed: Laparoscopy removal gastric band    Anesthesia type: General    Patient location: [facility] Floor    Post-op pain: Adequate analgesia.        Post-op assessment: no apparent anesthetic complications.    Cardiovascular, respiratory function and airway patency are stable.    Nausea and Vomiting are controlled. Adequate perioperative fluid hydration.    Post-op vital signs: stable    BP 111/68   Pulse 66   Temp(Src) 98.6 ??F (37 ??C) (Oral)   Resp 20   Ht 5\' 6"  (1.676 m)   Wt 233 lb (105.688 kg)   BMI 37.63 kg/m2   SpO2 92%     Level of consciousness: awake, alert  and oriented.  Mental status returned to baseline    Complications: none    Patient has met criteria for discharge.       Meredith Mody, CRNA  07/06/2013

## 2013-07-06 NOTE — Progress Notes (Signed)
Patient walked 2 laps in hall with daughter.  Back to bed, benedryl given, interdry applied to skin folds per request.  JP drain emptied. Call light in  reach.

## 2013-07-06 NOTE — Progress Notes (Signed)
Patient resting in bed.  Dr.Norhtup around to see patient.  Continue NPO except ice.  Patient aware.  Will monitor.

## 2013-07-06 NOTE — Progress Notes (Signed)
NUTRITION RISK ASSESSMENT  Admission Date: 07/05/2013 (consult for diet instruction for post-lab band removal    Rachael Mays is a 57 y.o. female admitted for removal of gastric band d/t erosion.  Pt is currently NPO post-op.  Denies need for nutrition intervention @ this time.  Has no further nutrition concerns to report.    Patient Active Problem List    Diagnosis Date Noted   ??? Complication of gastric banding 07/05/2013   ??? 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 Vertigo 04/11/2013   ??? Dizziness 03/29/2013   ??? 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 Morbid obesity 02/21/2009   ??? Back Pain 01/23/2009   ??? Deep Vein Thrombosis 01/23/2009   ??? Depression 01/23/2009   ??? High Cholesterol 01/23/2009   ??? Hypertension 01/23/2009   ??? Pulmonary Embolism 01/23/2009   ??? Abdominal  Pain, Other Specified Site 01/23/2009     Past Medical History   Diagnosis Date   ??? Anxiety    ??? Asthma    ??? Obesity    ??? Chronic back pain    ??? Deep vein thrombosis    ??? Depression    ??? GERD (gastroesophageal reflux disease)      NO LONGER SINCE LAP   ??? Pulmonary embolism (HCC)    ??? Obesity      hx of; had lap band   ??? Hyperlipidemia      hx; resolved with lap band   ??? Back pain 01/23/2009   ??? GERD (gastroesophageal reflux disease) 01/23/2009   ??? HIGH CHOLESTEROL 01/23/2009   ??? Hypertension    ??? Hypertension        Nutrition-focused Physical Findings                   Skin/Edema:  Skin Integrity (WDL): Within Defined Limits              Food/Nutrition-Related History  Pre-Admission/Home Diet: General         Allergies as of 07/05/2013 - Review Complete 07/05/2013   Allergen Reaction Noted   ??? Nystatin Other (See Comments) 03/29/2013   ??? Talwin [pentazocine] Other (See Comments) 03/29/2013       PO Diet Orders  Current diet order: Diet NPO Effective Now, Except for  Nursing Recorded PO:      Anthropometric Measures   Height: 5\' 6"  (167.6 cm)   Weight: 233 lb (105.688 kg)               Wt Readings from Last 50 Encounters:    07/05/13 233 lb (105.688 kg)   06/29/13 231 lb (104.781 kg)   06/12/13 225 lb (102.059 kg)   06/07/13 228 lb 8 oz (103.647 kg)   05/30/13 229 lb (103.874 kg)   05/22/13 230 lb 8 oz (104.554 kg)   05/19/13 231 lb 11.3 oz (105.1 kg)   05/18/13 225 lb (102.059 kg)   05/18/13 232 lb (105.235 kg)   04/27/13 229 lb 3.2 oz (103.964 kg)   04/17/13 230 lb (104.327 kg)   04/03/13 231 lb (104.781 kg)   03/29/13 228 lb 8 oz (103.647 kg)   03/10/13 227 lb 8 oz (103.193 kg)   02/23/13 227 lb 9.6 oz (103.239 kg)   12/28/12 217 lb (98.431 kg)   12/15/12 216 lb (97.977 kg)   10/20/12 213 lb (96.616 kg)   06/24/12 212 lb (96.163 kg)   06/02/12 210 lb (95.255 kg)   05/31/12 208  lb 3.2 oz (94.439 kg)   05/19/12 209 lb (94.802 kg)   04/14/12 211 lb (95.709 kg)   03/17/12 213 lb (96.616 kg)   03/16/12 214 lb (97.07 kg)   12/23/11 223 lb (101.152 kg)   11/14/11 220 lb (99.791 kg)   11/07/11 220 lb (99.791 kg)   11/03/11 226 lb (102.513 kg)   05/15/11 233 lb 12.8 oz (106.051 kg)   04/08/10 240 lb (108.863 kg)   04/01/10 240 lb (108.863 kg)   03/26/10 241 lb 2 oz (109.374 kg)   02/06/10 248 lb 3 oz (112.577 kg)   11/07/09 243 lb (110.224 kg)   10/23/09 235 lb (106.595 kg)   08/07/09 246 lb (111.585 kg)   05/02/09 242 lb 5 oz (109.912 kg)   02/21/09 245 lb (111.131 kg)   01/23/09 244 lb 4 oz (110.791 kg)   11/19/08 243 lb 9 oz (110.479 kg)       Body mass index is 37.63 kg/(m^2).     Patient with no significant nutrition risk factors at this time.  Will reassess in 4 - 7 days.   Consult dietitian if intervention is needed before that time.      Tzipora Mcinroy R Aayat Hajjar RD, LD  Pager: [phone]

## 2013-07-06 NOTE — Progress Notes (Signed)
roxicet given po, see MAR for pain in abdomen at 8/10.

## 2013-07-06 NOTE — Progress Notes (Signed)
Patient back from xray.  Asleep in bed.  Daughter at bedside.

## 2013-07-06 NOTE — Progress Notes (Signed)
Patient ambulating.  Pain controlled.  No nv    Filed Vitals:    07/06/13 1847   BP: 109/67   Pulse: 72   Temp: 99.5 ??F (37.5 ??C)   Resp: 12     Abd: soft minimal incision tenderness    AP:  POD#1 s/p eroded band removal  Doing well  UGI is normal  Will start clear liquds and observe until tomorrow

## 2013-07-06 NOTE — Progress Notes (Signed)
Patient denies needs.  Dark nail polish removed on pulse ox finger and sensor reapplied.  Oxygen titrated to effect at 2Lpm 95%.

## 2013-07-06 NOTE — Progress Notes (Signed)
Am assessment complete.  Patient complains of "soreness" in abdomen.  Using PCA dilaudid for pain control.  Incisions x3 with dermabond dry and intact.  JP drain intact with serosanginous drainage present.  Patient encouraged to use IS.  States she has been up walking throughout the night.  Assist to BR and back to side of bed.  Awaiting transport for UGI

## 2013-07-07 LAB — CBC
Hematocrit: 36.5 % (ref 36.0–48.0)
Hemoglobin: 11.9 g/dL — ABNORMAL LOW (ref 12.0–16.0)
MCH: 28.3 pg (ref 26.0–34.0)
MCHC: 32.7 g/dL (ref 31.0–36.0)
MCV: 86.5 fL (ref 80.0–100.0)
MPV: 8.6 fL (ref 5.0–10.5)
Platelets: 165 10*3/uL (ref 135–450)
RBC: 4.22 M/uL (ref 4.00–5.20)
RDW: 14.2 % (ref 12.4–15.4)
WBC: 8.5 10*3/uL (ref 4.0–11.0)

## 2013-07-07 MED ADMIN — furosemide (LASIX) injection 20 mg: INTRAVENOUS | @ 20:00:00 | NDC 00409610202

## 2013-07-07 MED ADMIN — enoxaparin (LOVENOX) injection 30 mg: SUBCUTANEOUS | NDC 00075801301

## 2013-07-07 MED ADMIN — enoxaparin (LOVENOX) injection 30 mg: SUBCUTANEOUS | @ 02:00:00 | NDC 00075801301

## 2013-07-07 MED ADMIN — ondansetron (ZOFRAN) injection 4 mg: INTRAVENOUS | @ 19:00:00 | NDC 23155037831

## 2013-07-07 MED ADMIN — promethazine (PHENERGAN) injection 12.5 mg: INTRAVENOUS | @ 13:00:00 | NDC 00641092821

## 2013-07-07 MED ADMIN — ondansetron (ZOFRAN) injection 4 mg: INTRAVENOUS | @ 11:00:00 | NDC 23155037831

## 2013-07-07 MED ADMIN — enoxaparin (LOVENOX) injection 30 mg: SUBCUTANEOUS | @ 13:00:00 | NDC 00075801301

## 2013-07-07 MED ADMIN — lactated ringers infusion: INTRAVENOUS | @ 10:00:00 | NDC 00338011704

## 2013-07-07 MED ADMIN — sodium chloride flush 0.9 % injection 10 mL: INTRAVENOUS

## 2013-07-07 MED ADMIN — oxyCODONE-acetaminophen (ROXICET) 5-325 MG/5ML solution 10 mL: 10 mL | ORAL | @ 11:00:00 | NDC 00054864816

## 2013-07-07 MED ADMIN — oxyCODONE-acetaminophen (ROXICET) 5-325 MG/5ML solution 10 mL: 10 mL | ORAL | @ 19:00:00 | NDC 00054864816

## 2013-07-07 MED ADMIN — oxyCODONE-acetaminophen (ROXICET) 5-325 MG/5ML solution 10 mL: 10 mL | ORAL | @ 07:00:00 | NDC 00054864816

## 2013-07-07 MED ADMIN — oxyCODONE-acetaminophen (ROXICET) 5-325 MG/5ML solution 10 mL: 10 mL | ORAL | @ 15:00:00 | NDC 00054864816

## 2013-07-07 MED ADMIN — oxyCODONE-acetaminophen (ROXICET) 5-325 MG/5ML solution 10 mL: 10 mL | ORAL | @ 03:00:00 | NDC 00054864816

## 2013-07-07 MED ADMIN — morphine (PF) injection 2 mg: 2 mg | INTRAVENOUS | @ 13:00:00 | NDC 00409189001

## 2013-07-07 MED ADMIN — morphine (PF) injection 2 mg: 2 mg | INTRAVENOUS | @ 04:00:00 | NDC 00409189001

## 2013-07-07 MED ADMIN — morphine (PF) injection 2 mg: 2 mg | INTRAVENOUS | @ 19:00:00 | NDC 00409189001

## 2013-07-07 MED FILL — ROXICET 5-325 MG/5ML PO SOLN: 5-325 MG/5ML | ORAL | Qty: 10

## 2013-07-07 MED FILL — TRANSDERM-SCOP (1.5 MG) 1 MG/3DAYS TD PT72: 1 MG/3DAYS | TRANSDERMAL | Qty: 1

## 2013-07-07 MED FILL — MORPHINE SULFATE (PF) 2 MG/ML IV SOLN: 2 mg/mL | INTRAVENOUS | Qty: 1

## 2013-07-07 MED FILL — LACTATED RINGERS IV SOLN: INTRAVENOUS | Qty: 1000

## 2013-07-07 MED FILL — NORMAL SALINE FLUSH 0.9 % IV SOLN: 0.9 % | INTRAVENOUS | Qty: 10

## 2013-07-07 MED FILL — PROMETHAZINE HCL 25 MG/ML IJ SOLN: 25 MG/ML | INTRAMUSCULAR | Qty: 1

## 2013-07-07 MED FILL — FUROSEMIDE 10 MG/ML IJ SOLN: 10 MG/ML | INTRAMUSCULAR | Qty: 2

## 2013-07-07 MED FILL — LOVENOX 30 MG/0.3ML SC SOLN: 30 MG/0.3ML | SUBCUTANEOUS | Qty: 0.3

## 2013-07-07 MED FILL — ONDANSETRON HCL 4 MG/2ML IJ SOLN: 4 MG/2ML | INTRAMUSCULAR | Qty: 2

## 2013-07-07 MED FILL — DEXAMETHASONE SODIUM PHOSPHATE 4 MG/ML IJ SOLN: 4 MG/ML | INTRAMUSCULAR | Qty: 1

## 2013-07-07 MED FILL — GLYCOPYRROLATE 0.2 MG/ML IJ SOLN: 0.2 MG/ML | INTRAMUSCULAR | Qty: 1

## 2013-07-07 MED FILL — ROCURONIUM BROMIDE 50 MG/5ML IV SOLN: 50 MG/5ML | INTRAVENOUS | Qty: 5

## 2013-07-07 MED FILL — XYLOCAINE 2 % IJ SOLN: 2 % | INTRAMUSCULAR | Qty: 10

## 2013-07-07 MED FILL — MORPHINE SULFATE (PF) 2 MG/ML IV SOLN: 2 MG/ML | INTRAVENOUS | Qty: 1

## 2013-07-07 MED FILL — PROPOFOL 10 MG/ML IV EMUL: 10 MG/ML | INTRAVENOUS | Qty: 20

## 2013-07-07 MED FILL — NEOSTIGMINE METHYLSULFATE 1 MG/ML IJ SOLN: 1 MG/ML | INTRAMUSCULAR | Qty: 10

## 2013-07-07 NOTE — Progress Notes (Signed)
roxicet given po, see MAR.  Temp noted at 101.5

## 2013-07-07 NOTE — Consults (Signed)
Rachael Mays CONSULT NOTE    07/07/2013 4:10 PM    Patient Information: Rachael Mays   Date of Admit:  07/05/2013  Primary Care Physician:  Lum Keas, MD  Requesting Physician:  Darrall Dears, MD    Reason for consult:   Medical evaluation and recommendations for hypoxia.    Chief complaint:  SOB    History of Present Illness:  Rachael Mays is a 57 y.o. female on Darrall Dears, MD service who was admitted on 07/05/2013 for removal of lap band.  The surgery was done without immediate complication on 7/30.  She required supplemental O2 during the night at 2L.  Today she c/o some chest tightness and was noted to be extremely hypoxic on room air with an spO2 in the 50%s.  Mays have been consulted for emergent evaluation of her hypoxia.    A CXR was done but is not yet read by the Radiologist.  She was given 20mg  IV lasix already and has urinated approx .  She denies chest pain and has not noticed any wheezing.  She still feels SOB and reports chest tightness.    She has been dx with asthma in the past.  She reports very good control of her symptoms chronically and is not on any controller medications.  She uses her albuteral inhaler very rarely, 1-2x/ month.    She has a remote hx of PE in the 1980s that was attributed to oral contraceptive use.    History obtained from patient.   Old records show a Left Heart Cath 06/03/12: EF 65% with normal coronaries.    REVIEW OF SYSTEMS:   Constitutional:  Negative for fever, chills or night sweats  Eyes:  Negative for exudate, itching  Ears:  Negative for tinnitus   Nose:  Negative for rhinorrhea, epistaxis  Mouth/Throat:  Negative for hoarseness, sore throat.  Respiratory:   See hopi  Cardiovascular: Negative for chest pain, palpitations   Gastrointestinal:  Negative for nausea, vomiting, diarrhea  Genitourinary:  Negative for polyuria, dysuria   Hematologic/Lymphatic:  Negative for  bleeding tendency, easy  bruising  Musculoskeletal:  Negative for myalgias, arthralgias  Neurologic:  Negative for  confusion,dysarthria.  Skin :  Negative for itching, rash  Psychiatric:  Negative for depression, anxiety.  Endocrine:  Negative for polydipsia, polyuria, heat /cold intolerance.    Past Medical History:   has a past medical history of Anxiety; Asthma; Obesity; Chronic back pain; Deep vein thrombosis; Depression; GERD (gastroesophageal reflux disease); Pulmonary embolism (HCC); Obesity; Hyperlipidemia; Back pain; GERD (gastroesophageal reflux disease); HIGH CHOLESTEROL; Hypertension; and Hypertension.     Past Surgical History:   has past surgical history that includes Colonoscopy; Upper gastrointestinal endoscopy; Cesarean section; Cholecystectomy; Hysterectomy; shoulder surgery; Lap Band (05/01/08); back surgery; Colonoscopy (04/08/10); Spine surgery; other surgical history; Upper gastrointestinal endoscopy (05/19/13); and other surgical history (07/05/13).     Medications:  ??? furosemide       ??? furosemide  20 mg Intravenous Once   ??? sodium chloride flush  10 mL Intravenous Q12H [facility]   ??? scopolamine  1 patch Transdermal Q72H   ??? enoxaparin  30 mg Subcutaneous Q12H SCH       Allergies:  Allergies   Allergen Reactions   ??? Nystatin Other (See Comments)     Yeast infection     ??? Talwin [Pentazocine] Other (See Comments)     dizzy        Social History:  reports that she quit smoking about 10 months ago. Her smoking use included Cigarettes. She has a 17.5 pack-year smoking history. She has never used smokeless tobacco. She reports that she does not drink alcohol or use illicit drugs.     Family History:  family history includes Arthritis in her mother; Cancer in her mother; Cancer (age of onset: 63) in her father; Depression in her mother; Diabetes in an other family member; Early Death in her paternal grandmother; Heart Disease in her maternal grandmother, mother, and paternal grandfather; Heart Disease (age of onset: 14) in her  mother; High Blood Pressure in her mother and another family member; Obesity in an other family member; and Ovarian Cancer (age of onset: 35) in her mother.    Physical Exam:  BP 138/71   Pulse 99   Temp(Src) 100.4 ??F (38 ??C) (Temporal)   Resp 22   Ht 5\' 6"  (1.676 m)   Wt 233 lb (105.688 kg)   BMI 37.63 kg/m2   SpO2 93%    General appearance: Appears comfortable but breathing is noticeably labored. Well nourished  Eyes: Sclera clear, pupils equal  ENT: Moist mucus membranes, no thrush  Neck: Trachea midline, symmetrical  Cardiovascular: Regular rhythm, normal S1, S2. No murmur, gallop, rub. No edema in  lower extremities  Respiratory: bibasilar rales.   No wheeze. Good inspiratory effort  Gastrointestinal:  abd not assessed.  Musculoskeletal: No cyanosis in digits, warm extremities  Neurologic: Cranial nerves grossly intact, no motor or speech deficits.  Psychiatric: Normal affect. Alert and oriented to time, place and person.  Skin: Warm, dry, normal turgor, no rash    Labs:  CBC:   Lab Results   Component Value Date    WBC 8.5 07/07/2013    RBC 4.22 07/07/2013    HGB 11.9 07/07/2013    HCT 36.5 07/07/2013    MCV 86.5 07/07/2013    MCH 28.3 07/07/2013    MCHC 32.7 07/07/2013    RDW 14.2 07/07/2013    PLT 165 07/07/2013    MPV 8.6 07/07/2013     BMP:    Lab Results   Component Value Date    NA 141 07/06/2013    K 4.2 07/06/2013    CL 103 07/06/2013    CO2 33 07/06/2013    BUN 18 07/06/2013    CREATININE 0.8 07/06/2013    CREATININE 0.8 05/15/2011    CALCIUM 9.2 07/06/2013    GFRAA >60 07/06/2013    GFRAA >60 03/16/2012    LABGLOM >60 07/06/2013    GLUCOSE 115 07/06/2013         CXR:  Per my review shows moderate pulmonary vascular congestion      Problem List:    Principal Problem:    Complication of gastric banding  Active Problems:    Morbid obesity       Assessment & Plan:     1. Hypoxia secondary to pulmonary edema: likely iatrogenic from aggressive perioperative IVF administration.  Normal EF and renal function.  D/c IVF.  Total of 40mg  IV  lasix x 1 now.  Check weight.  Re-assess after diuresis.    Will Follow.  Thank you for the opportunity to participate in the care of your patient.    Leane Platt, NP   07/07/2013 4:10 PM      I have reviewed the midlevel's findings and I agree with the above assessment and plan. I have personally performed a face to face diagnostic evaluation on this  patient. History and Exam by me shows:    Rachael Mays is seen at the bedside. History as per above  Pt was seen after receiving 40mg  Lasix, has since had 1.1L of urine out and is symptomatically feeling better. Sats had additionally improved to 94% on 4L (had been on 6L with sats of 91% prior to diuresis).  CXR was reviewed and is consistent with bilateral hila congestion consistent with Fluid overload    ??     Plan:  1. Continue with Diuresis, plan on repeat Lasix tomorrow am, will watch electrolytes while diuresing, labs in am  2. Symptomatically improving at this time, anticipate continued improvement in symptoms  3. Encouraged continued and consistent incentive spirometry use  Care plan discussed with patient and family at bedside, Nursing staff updated    Thank you for the above consult, will follow patient along with you    Orland Dec, MD   07/07/2013 5:54 PM

## 2013-07-07 NOTE — Progress Notes (Signed)
Patient ambulated hallways.

## 2013-07-07 NOTE — Progress Notes (Signed)
Patient c/o pain and nausea.  Morphine and Phenergan given ivp, see MAR. Patient demonstrated use of IS.  Congested cough noted, no sputum.  Will monitor.

## 2013-07-07 NOTE — Progress Notes (Signed)
Leane Platt NP around to see patient.  20mg  lasix ordered.  Patient 94% on 5LNC.  Continues to use IS.

## 2013-07-07 NOTE — Progress Notes (Addendum)
Spoke with Dr.Northup, STAT CXR ordered,  Lasix given per order.  patinet voided 400cc.  New consult for the hospitalist.

## 2013-07-07 NOTE — Plan of Care (Signed)
Problem: PAIN  Goal: Patient???s pain/discomfort is manageable  Outcome: Ongoing  Patient able to use pain rating scale 0/10 adequately without problems. Pain medications explained along with frequency and when to notify the nurse with possible side effects. Verbalizes understanding. Call light within reach. Resting quietly in bed. Denies needs at present.

## 2013-07-07 NOTE — Progress Notes (Signed)
Nausea relieved by zofran.  See emar.    Family in room.  Denies shortness of breath

## 2013-07-07 NOTE — Progress Notes (Signed)
The patient having incisional pain pain.  Ambulating frequently in the hall.  Having some nausea. Feeling hungry.    Filed Vitals:    07/07/13 0732   BP: 125/67   Pulse: 99   Temp: 99.9 ??F (37.7 ??C)   Resp: 16     Abd: soft incision /ND incision healing well      AP:  POD#2 s/p band removal    Will decrease her IVF  Her low grade temp I think is from her lungs.  Rechecked WBC and decreased to 8.5.  Her JP output is also just serous fluid.    Will cont liquids.  Hold d/c until pain better control and tolerating liquids w/o nausea.

## 2013-07-07 NOTE — Progress Notes (Signed)
Patient asleep in bed.  O2 sats low in the 60's.  Patient woke up, sat on the side of the bed.  Oxygen placed at 5L/NC, oxygen level increased to 90%.  Patient using IS. Patient denies any chest pain.  Will call Dr. Suzi Roots

## 2013-07-07 NOTE — Progress Notes (Signed)
ABG being drawn by respiratory.

## 2013-07-07 NOTE — Progress Notes (Signed)
abg x2 rt radial attempted. Both samples clotted. Will cont to monitor pt. Charge rn aware.

## 2013-07-07 NOTE — Progress Notes (Signed)
Patient resting in bed with eyes closed.  No needs at current.  Call light in reach.

## 2013-07-07 NOTE — Progress Notes (Signed)
Am assessment complete.  Patient sitting up in chair.  Encouraged to use IS.  Incisions to abdomen x3 dry and intact with derma-bond.  JP drain in place with serosang. Drainage present.  Call light in reach.  No needs at current

## 2013-07-07 NOTE — Progress Notes (Signed)
Patient bathed.  Patient resting in bed.  Respirations even and unlabored.  No signs of respiratory distress.  No signs of pain noted.  Call light in easy view and reach.  Fall precautions in place.

## 2013-07-07 NOTE — Progress Notes (Signed)
Patient ambulating in room.  roxicet given po, see MAR for pain in abdomen at 7/10

## 2013-07-08 LAB — CBC
Hematocrit: 36.8 % (ref 36.0–48.0)
Hemoglobin: 12.2 g/dL (ref 12.0–16.0)
MCH: 28.4 pg (ref 26.0–34.0)
MCHC: 33.1 g/dL (ref 31.0–36.0)
MCV: 85.8 fL (ref 80.0–100.0)
MPV: 8.8 fL (ref 5.0–10.5)
Platelets: 154 10*3/uL (ref 135–450)
RBC: 4.28 M/uL (ref 4.00–5.20)
RDW: 13.8 % (ref 12.4–15.4)
WBC: 6.8 10*3/uL (ref 4.0–11.0)

## 2013-07-08 LAB — BASIC METABOLIC PANEL
BUN: 10 mg/dL (ref 7–20)
CO2: 31 mmol/L (ref 21–32)
Calcium: 8.8 mg/dL (ref 8.3–10.6)
Chloride: 98 mmol/L — ABNORMAL LOW (ref 99–110)
Creatinine: 0.7 mg/dL (ref 0.6–1.1)
GFR African American: >60 (ref >60)
GFR Non-African American: >60 (ref >60)
Glucose: 110 mg/dL — ABNORMAL HIGH (ref 70–99)
Potassium: 3 mmol/L — ABNORMAL LOW (ref 3.5–5.1)
Sodium: 138 mmol/L (ref 136–145)

## 2013-07-08 MED ADMIN — furosemide (LASIX) tablet 40 mg: ORAL | @ 14:00:00 | NDC 00904579761

## 2013-07-08 MED ADMIN — potassium chloride (KAYCIEL) 20 MEQ/15ML (10%) solution 20 mEq: ORAL | @ 14:00:00 | NDC 00121146530

## 2013-07-08 MED ADMIN — enoxaparin (LOVENOX) injection 30 mg: SUBCUTANEOUS | @ 13:00:00 | NDC 00075801301

## 2013-07-08 MED ADMIN — sodium chloride flush 0.9 % injection 10 mL: INTRAVENOUS | @ 09:00:00

## 2013-07-08 MED ADMIN — potassium chloride (KAYCIEL) 20 MEQ/15ML (10%) solution 20 mEq: ORAL | @ 21:00:00 | NDC 00121146530

## 2013-07-08 MED ADMIN — ondansetron (ZOFRAN-ODT) disintegrating tablet 4 mg: ORAL | @ 22:00:00 | NDC 00781523806

## 2013-07-08 MED ADMIN — diphenhydrAMINE (BENADRYL) injection 25 mg: INTRAVENOUS | @ 09:00:00 | NDC 00641037621

## 2013-07-08 MED ADMIN — sodium chloride flush 0.9 % injection 10 mL: INTRAVENOUS | @ 02:00:00

## 2013-07-08 MED ADMIN — ondansetron (ZOFRAN-ODT) disintegrating tablet 4 mg: ORAL | @ 17:00:00 | NDC 00781523806

## 2013-07-08 MED ADMIN — ondansetron (ZOFRAN) injection 4 mg: INTRAVENOUS | @ 02:00:00 | NDC 23155037831

## 2013-07-08 MED ADMIN — sodium chloride flush 0.9 % injection 10 mL: INTRAVENOUS | @ 13:00:00

## 2013-07-08 MED ADMIN — ondansetron (ZOFRAN) injection 4 mg: INTRAVENOUS | @ 13:00:00 | NDC 23155037831

## 2013-07-08 MED ADMIN — oxyCODONE-acetaminophen (ROXICET) 5-325 MG/5ML solution 10 mL: 10 mL | ORAL | @ 08:00:00 | NDC 00054864816

## 2013-07-08 MED ADMIN — oxyCODONE-acetaminophen (ROXICET) 5-325 MG/5ML solution 10 mL: 10 mL | ORAL | @ 13:00:00 | NDC 00054864816

## 2013-07-08 MED ADMIN — oxyCODONE-acetaminophen (ROXICET) 5-325 MG/5ML solution 10 mL: 10 mL | ORAL | @ 21:00:00 | NDC 00054864816

## 2013-07-08 MED ADMIN — oxyCODONE-acetaminophen (ROXICET) 5-325 MG/5ML solution 10 mL: 10 mL | ORAL | @ 02:00:00 | NDC 00054864816

## 2013-07-08 MED FILL — ROXICET 5-325 MG/5ML PO SOLN: 5-325 MG/5ML | ORAL | Qty: 10

## 2013-07-08 MED FILL — NORMAL SALINE FLUSH 0.9 % IV SOLN: 0.9 % | INTRAVENOUS | Qty: 10

## 2013-07-08 MED FILL — LOVENOX 30 MG/0.3ML SC SOLN: 30 MG/0.3ML | SUBCUTANEOUS | Qty: 0.3

## 2013-07-08 MED FILL — DIPHENHYDRAMINE HCL 50 MG/ML IJ SOLN: 50 MG/ML | INTRAMUSCULAR | Qty: 1

## 2013-07-08 MED FILL — FUROSEMIDE 40 MG PO TABS: 40 MG | ORAL | Qty: 1

## 2013-07-08 MED FILL — POTASSIUM CHLORIDE 20 MEQ/15ML (10%) PO SOLN: 20 MEQ/15ML (10%) | ORAL | Qty: 30

## 2013-07-08 MED FILL — ONDANSETRON 4 MG PO TBDP: 4 MG | ORAL | Qty: 1

## 2013-07-08 MED FILL — ONDANSETRON HCL 4 MG/2ML IJ SOLN: 4 MG/2ML | INTRAMUSCULAR | Qty: 2

## 2013-07-08 NOTE — Progress Notes (Addendum)
Patient given pain medication for complaints of pain.  Encouraged patient to ambulate and use IS at this time while she is awake.  Patient complaining of small amount of nausea, not time for any nausea medication at this time.  Patient taking in clear liquids pretty frequently, encouraged patient to space out intake and to ambulate more often.  Dymond Gutt E Nesiah Jump

## 2013-07-08 NOTE — Progress Notes (Signed)
Patient out of bed walking in hallway.  Gait steady.  Family present.

## 2013-07-08 NOTE — Progress Notes (Signed)
Notified regarding temperature of 102.6. Reviewed Dr. Judithann Sheen note from today regarding fever. Normal WBC this am. No tachycardia and not currently with hypotension. CXR from 07/07/13 reviewed.     Plan: Will repeat CXR, obtain blood cultures and UA at this time. Obtain CBC in am. No indication for antibiotics pending above evaluation.

## 2013-07-08 NOTE — Progress Notes (Signed)
Patient resting in bed with eyes closed, no signs of distress, daughter with eyes closed at bedside, will let patient continue to rest at this time, call light in reach  [facility] E Deyci Gesell

## 2013-07-08 NOTE — Progress Notes (Signed)
Patient resting in bed.  Respirations even and unlabored.  No signs of respiratory distress.  No signs of pain noted.  Call light in easy view and reach.  Fall precautions in place.

## 2013-07-08 NOTE — Progress Notes (Addendum)
[facility] PROGRESS NOTE    07/08/2013 9:49 AM        Name: LAYONNA DOBIE .              Admitted: 07/05/2013  Primary Care Provider: Margart Sickles SLOUGH, MD (Tel: [phone])      Subjective:  Marland Kitchen    Breathing better s/p diuresis.   Weight was up 12lbs in 48 hours.  She lost 5lbs with diuresis.  No chest pain.    Reviewed interval ancillary notes    Current Medications    furosemide (LASIX) injection 40 mg Once   potassium chloride (KAYCIEL) 20 MEQ/15ML (10%) solution 20 mEq Q4H   morphine (PF) injection 2 mg Q2H PRN   oxyCODONE-acetaminophen (ROXICET) 5-325 MG/5ML solution 10 mL Q4H PRN   oxyCODONE-acetaminophen (ROXICET) 5-325 MG/5ML solution 5 mL Q4H PRN   sodium chloride flush 0.9 % injection 10 mL Q12H SCH   sodium chloride flush 0.9 % injection 10 mL PRN   acetaminophen (TYLENOL) tablet 650 mg Q4H PRN   naloxone (NARCAN) injection 0.4 mg PRN   ondansetron (ZOFRAN) injection 4 mg Q6H PRN   scopolamine (TRANSDERM-SCOP) 1.5 MG 1 patch Q72H   enoxaparin (LOVENOX) injection 30 mg Q12H SCH   diphenhydrAMINE (BENADRYL) injection 25 mg Q6H PRN   promethazine (PHENERGAN) injection 12.5 mg Q6H PRN       Objective:  BP 109/67   Pulse 65   Temp(Src) 97.8 ??F (36.6 ??C) (Oral)   Resp 16   Ht 5\' 6"  (1.676 m)   Wt 240 lb 4.8 oz (108.999 kg)   BMI 38.8 kg/m2   SpO2 96%    Intake/Output Summary (Last 24 hours) at 07/08/13 0949  Last data filed at 07/08/13 0530   Gross per 24 hour   Intake 1388.33 ml   Output    965 ml   Net 423.33 ml    Wt Readings from Last 3 Encounters:   07/08/13 240 lb 4.8 oz (108.999 kg)   06/29/13 231 lb (104.781 kg)   06/12/13 225 lb (102.059 kg)       General appearance:  Appears comfortable  Eyes: Sclera clear. Pupils equal.  ENT: Moist oral mucosa. Trachea midline, no adenopathy.  Cardiovascular: Regular rhythm, normal S1, S2. No murmur. No edema in lower extremities  Respiratory: Not using accessory muscles. Good inspiratory effort. Still with bibasilar rales, no wheeze.   GI:  Abdomen soft, no tenderness, not distended, normal bowel sounds  Musculoskeletal: No cyanosis in digits, neck supple  Neurology: CN 2-12 grossly intact. No speech or motor deficits  Psych: Normal affect. Alert and oriented in time, place and person  Skin: Warm, dry, normal turgor    Labs and Tests:  CBC:   Recent Labs      07/06/13   0633  07/07/13   0912  07/08/13   0546   WBC  13.9*  8.5  6.8   HGB  12.0  11.9*  12.2   PLT  184  165  154     BMP:  Recent Labs      07/06/13   0633  07/08/13   0546   NA  141  138   K  4.2  3.0*   CL  103  98*   CO2  33*  31   BUN  18  10   CREATININE  0.8  0.7   GLUCOSE  115*  110*     Hepatic: Recent Labs  07/06/13   0633   AST  72*   ALT  81*   BILITOT  1.0   ALKPHOS  86     Problem List  Principal Problem:    Complication of gastric banding  Active Problems:    Morbid obesity       Assessment & Plan:   1. Postoperative pulmonary edema with hypoxia: feeling better with diuresis.  Weight still up 7lbs since admission.  Will give one more dose Lasix 40mg  PO (IV access lost).  Wean O2.  2. Acute resp failure: O2 requirements decreasing.  Wean to room air.  3. Hypokalemia: due to lasix.  Replace with kayciel.    Diet: Diet NPO Effective Now, Except for  Code:Full Code    Leane Platt, NP   07/08/2013 9:49 AM    I have reviewed the midlevel's findings and I agree with the above assessment and plan. I have personally performed a face to face diagnostic evaluation on this patient. History and Exam by me shows:    Physical exam  BP 109/67   Pulse 65   Temp(Src) 97.8 ??F (36.6 ??C) (Oral)   Resp 16   Ht 5\' 6"  (1.676 m)   Wt 240 lb 4.8 oz (108.999 kg)   BMI 38.8 kg/m2   SpO2 98%  Chest: Clear to auscultations bilaterally.  CVS: S1S2 RRR, No MRG  Abd: Soft, NT, ND, +BS    Plan:  1. Continue on the management as discussed above      Lolita Lenz, MD   07/08/2013 3:26 PM

## 2013-07-08 NOTE — Plan of Care (Signed)
Problem: SAFETY  Goal: Free from accidental physical injury  Outcome: Ongoing  Goal: Free from intentional harm  Outcome: Ongoing    Problem: DAILY CARE  Goal: Daily care needs are met  Outcome: Ongoing    Problem: PAIN  Goal: Patient???s pain/discomfort is manageable  Outcome: Ongoing    Problem: SKIN INTEGRITY  Goal: Skin integrity is maintained or improved  Outcome: Ongoing    Problem: KNOWLEDGE DEFICIT  Goal: Patient/S.O. demonstrates understanding of disease process, treatment plan, medications, and discharge instructions.  Outcome: Ongoing    Problem: DISCHARGE BARRIERS  Goal: Patient???s continuum of care needs are met  Outcome: Ongoing    Problem: Discharge Planning  Goal: Knowledge of discharge instructions  Outcome: Ongoing  Goal: Able to perform ADL  Outcome: Ongoing  Goal: Absence of vomiting  Outcome: Ongoing  Goal: Knowledge of medication management  Outcome: Ongoing    Problem: Pain - Acute  Goal: Reduction in pain sensation  Outcome: Ongoing  Goal: Communication of presence of pain  Outcome: Ongoing    Problem: Nausea/Vomiting  Goal: Absence of nausea/vomiting  Outcome: Ongoing    Problem: Gas Exchange - Impaired  Goal: Adequate oxygenation  Outcome: Ongoing  Goal: Absence of hypoxia  Outcome: Ongoing  Goal: Spontaneous ventilation  Outcome: Ongoing    Problem: Body Temperature - Risk of, Imbalanced  Goal: Body temperature within specified parameters  Outcome: Ongoing  Goal: Absence of shivering  Outcome: Ongoing    Problem: Urinary Retention  Goal: Urinary elimination within specified parameters  Outcome: Ongoing    Problem: Fluid Volume - Risk of, Imbalanced  Goal: Absence of imbalanced fluid volume signs and symptoms  Outcome: Ongoing  Goal: Absence of active bleeding, surgical site  Outcome: Ongoing  Goal: Balanced intake and output  Outcome: Ongoing  Goal: Hemoglobin within specified parameters  Outcome: Ongoing    Problem: Bowel Function - Altered  Goal: Bowel function within specified  parameters  Outcome: Ongoing  Goal: Active bowel sounds  Outcome: Ongoing  Goal: Bowel elimination within specified parameters  Outcome: Ongoing    Problem: Infection - Risk of, Surgical Site Infection  Goal: Absence of surgical site infection  Outcome: Ongoing    Problem: Injury - Risk of, Adverse Drug Event  Goal: Absence of adverse drug events  Outcome: Ongoing  Goal: Absence of medication errors  Outcome: Ongoing  Goal: Knowledge of prescribed medications  Outcome: Ongoing    Problem: Mental Status - Risk of, Impaired  Goal: Mental status restored to baseline  Outcome: Ongoing    Problem: Nutrition Deficit - Risk of  Goal: Adequate nutritional intake  Outcome: Ongoing    Problem: Pressure Ulcer - Risk of  Goal: Absence of pressure ulcer  Outcome: Ongoing    Problem: Tissue Perfusion - Cardiopulmonary, Altered  Goal: Circulatory function within specified parameters  Outcome: Ongoing  Goal: Cardiac rhythm stable  Outcome: Ongoing    Problem: Venous Thromboembolism - Risk of  Goal: Absence of venous thromboembolism  Outcome: Ongoing  Goal: Knowledge of venous thromboembolism prevention  Outcome: Ongoing    Problem: Falls - Risk of  Goal: Absence of falls  Outcome: Ongoing

## 2013-07-08 NOTE — Progress Notes (Signed)
Patient with elevated temperature.  Filed Vitals:    07/08/13 2301   BP: 148/70   Pulse: 78   Temp: 102.6 ??F (39.2 ??C)   Resp: 16     Page placed to hospitalist awaiting call back.

## 2013-07-08 NOTE — Progress Notes (Signed)
Patient out of bed bowel movement 2x over night.  Large amounts of flatus.  Patient resting in bed.  Respirations even and unlabored.  No signs of respiratory distress.  No signs of pain noted.  Call light in easy view and reach.  Fall precautions in place.

## 2013-07-08 NOTE — Progress Notes (Signed)
45 minutes spent with patient during morning assessment.  Plan of care reviewed in detail, importance of frequent ambulation and use of IS reviewed with patient.  Patient and daughter at bedside verbally understand.  Patient on 4 liters of oxygen, monitor on finger, very loose, new monitor applied, oxygen turned off, patient able to remain above 92 % on room air and while ambulating in hallway, but once patient back in bed resting oxygen dropped to 88 %, 2 liters of oxygen applied, encouraged more frequent ambulation and deep breathing.  Lareen Mullings E Ami Thornsberry

## 2013-07-08 NOTE — Progress Notes (Signed)
Encouraged patient to ambulate in hallway, family at bedside also encouraging patient.  Rachael Mays E Rachael Mays

## 2013-07-08 NOTE — Progress Notes (Signed)
Patient out of bed to bathroom.  Linens changed.  Patient bathed.  Oral care  Done.  Patient tolerated well.  Denies needs at this time.  Patient tolerated applesauce.  Patient states food helped her nausea and headache.

## 2013-07-08 NOTE — Progress Notes (Signed)
Patient given zofran po for complaints of nausea, patient ambulated 2 laps in hallway with family, tolerated very well.  Oxygen saturation remain above 96 % on room air during and after ambulation.  Patients family request patient have phenergan, explained to family that she needs to try to not be sedated and try zofran first.  Patient with no emesis and family at bedside giving patient food.  Encouraged patient to decrease all stimulation and rest to allow nausea to pass.  And to only sip on water if she is nauseated.  Explained to patient that when she goes home she will be discharged with zofran and roxice for pain and nausea.Rachael Mays Rachael Mays

## 2013-07-08 NOTE — Progress Notes (Signed)
Patient given zofran po for nausea, states her nausea comes and goes and has not been too bad.  Patient ambulated in room, tolerated well.   Zachery Niswander E Geni Skorupski

## 2013-07-08 NOTE — Progress Notes (Signed)
Patient out of bed to walk in hall.  Gait steady.  Family in room.

## 2013-07-08 NOTE — Progress Notes (Signed)
Feeling better today.  +flatus and BM.  Pain less.  Ambulating and tolerating liquids.  Feels hungry and wants to advance diet.    Filed Vitals:    07/08/13 0710   BP: 109/67   Pulse: 65   Temp: 97.8 ??F (36.6 ??C)   Resp: 16     Abd: soft +incision tenderness/ND  Wound healing well  Minimal JP output yesteray, just serous      AP:  S/p band removal  Clinically appears to be improving  Had fever last night likely from atelectasis.  Encourage IS use. Is currently less than 1000 on IS  Increase activity.  If nausea remains under control, can advance to soft diet.

## 2013-07-08 NOTE — Plan of Care (Signed)
Problem: PAIN  Goal: Patient???s pain/discomfort is manageable  Outcome: Ongoing  Patient able to use pain rating scale 0/10 adequately without problems. Pain medications explained along with frequency and when to notify the nurse with possible side effects. Verbalizes understanding. Call light within reach. Resting quietly in bed. Denies needs at present.

## 2013-07-08 NOTE — Progress Notes (Signed)
Patient resting in bed.  Respirations even and unlabored.  No signs of respiratory distress.  No signs of pain noted.  Call light in easy view and reach.  Fall precautions in place.   Eating mashed potatos tolerating well.

## 2013-07-09 LAB — BASIC METABOLIC PANEL
BUN: 8 mg/dL (ref 7–20)
CO2: 35 mmol/L — ABNORMAL HIGH (ref 21–32)
Calcium: 8.6 mg/dL (ref 8.3–10.6)
Chloride: 99 mmol/L (ref 99–110)
Creatinine: 0.8 mg/dL (ref 0.6–1.1)
GFR African American: >60 (ref >60)
GFR Non-African American: >60 (ref >60)
Glucose: 111 mg/dL — ABNORMAL HIGH (ref 70–99)
Potassium: 3.9 mmol/L (ref 3.5–5.1)
Sodium: 137 mmol/L (ref 136–145)

## 2013-07-09 LAB — CBC WITH AUTO DIFFERENTIAL
Basophils %: 0.7 %
Basophils Absolute: 0 10*3/uL (ref 0.0–0.2)
Eosinophils %: 1.2 %
Eosinophils Absolute: 0.1 10*3/uL (ref 0.0–0.6)
Hematocrit: 36 % (ref 36.0–48.0)
Hemoglobin: 11.9 g/dL — ABNORMAL LOW (ref 12.0–16.0)
Lymphocytes %: 15 %
Lymphocytes Absolute: 0.8 10*3/uL — ABNORMAL LOW (ref 1.0–5.1)
MCH: 28 pg (ref 26.0–34.0)
MCHC: 33.1 g/dL (ref 31.0–36.0)
MCV: 84.8 fL (ref 80.0–100.0)
MPV: 8.6 fL (ref 5.0–10.5)
Monocytes %: 8.3 %
Monocytes Absolute: 0.4 10*3/uL (ref 0.0–1.3)
Neutrophils %: 74.8 %
Neutrophils Absolute: 3.8 10*3/uL (ref 1.7–7.7)
Platelets: 169 10*3/uL (ref 135–450)
RBC: 4.25 M/uL (ref 4.00–5.20)
RDW: 13.9 % (ref 12.4–15.4)
WBC: 5 10*3/uL (ref 4.0–11.0)

## 2013-07-09 LAB — URINALYSIS WITH REFLEX TO CULTURE
Glucose, Ur: NEGATIVE mg/dL
Ketones, Urine: 15 mg/dL — AB
Leukocyte Esterase, Urine: NEGATIVE
Nitrite, Urine: NEGATIVE
Protein, UA: 30 mg/dL — AB
Specific Gravity, UA: >=1.03 (ref 1.005–1.030)
Urobilinogen, Urine: >=8 E.U./dL — AB (ref <2.0)
pH, UA: 7 (ref 5.0–8.0)

## 2013-07-09 LAB — MICROSCOPIC URINALYSIS

## 2013-07-09 MED ADMIN — scopolamine (TRANSDERM-SCOP) 1.5 MG 1 patch: TRANSDERMAL | NDC 10019055388

## 2013-07-09 MED ADMIN — sodium chloride (PF) 0.9 % injection 20 mL: INTRAVENOUS | @ 16:00:00

## 2013-07-09 MED ADMIN — enoxaparin (LOVENOX) injection 30 mg: SUBCUTANEOUS | NDC 00075801301

## 2013-07-09 MED ADMIN — sodium chloride flush 0.9 % injection 10 mL: INTRAVENOUS | @ 13:00:00

## 2013-07-09 MED ADMIN — furosemide (LASIX) tablet 40 mg: ORAL | NDC 00904579761

## 2013-07-09 MED ADMIN — scopolamine (TRANSDERM-SCOP) 1.5 MG 1 patch: TRANSDERMAL | @ 19:00:00 | NDC 10019055388

## 2013-07-09 MED ADMIN — furosemide (LASIX) tablet 40 mg: ORAL | @ 12:00:00 | NDC 51079007320

## 2013-07-09 MED ADMIN — iohexol (OMNIPAQUE 240) injection 50 mL: ORAL | @ 15:00:00 | NDC 00407141230

## 2013-07-09 MED ADMIN — diphenhydrAMINE (BENADRYL) injection 25 mg: INTRAVENOUS | @ 15:00:00 | NDC 00641037621

## 2013-07-09 MED ADMIN — ioversol (OPTIRAY) 68 % injection 125 mL: INTRAVENOUS | @ 16:00:00 | NDC 00019132381

## 2013-07-09 MED ADMIN — piperacillin-tazobactam (ZOSYN) IVPB 3.375 g: INTRAVENOUS | @ 18:00:00 | NDC 00206886101

## 2013-07-09 MED ADMIN — 0.9 % sodium chloride infusion: INTRAVENOUS | @ 13:00:00 | NDC 00338004904

## 2013-07-09 MED ADMIN — promethazine (PHENERGAN) injection 12.5 mg: INTRAVENOUS | @ 23:00:00 | NDC 00641092821

## 2013-07-09 MED ADMIN — enoxaparin (LOVENOX) injection 30 mg: SUBCUTANEOUS | @ 12:00:00 | NDC 00075801301

## 2013-07-09 MED ADMIN — oxyCODONE-acetaminophen (ROXICET) 5-325 MG/5ML solution 10 mL: 10 mL | ORAL | @ 11:00:00 | NDC 00054864816

## 2013-07-09 MED ADMIN — oxyCODONE-acetaminophen (ROXICET) 5-325 MG/5ML solution 10 mL: 10 mL | ORAL | @ 03:00:00 | NDC 00054864816

## 2013-07-09 MED ADMIN — morphine (PF) injection 2 mg: 2 mg | INTRAVENOUS | @ 23:00:00 | NDC 00409189001

## 2013-07-09 MED ADMIN — morphine (PF) injection 2 mg: 2 mg | INTRAVENOUS | @ 18:00:00 | NDC 00409189001

## 2013-07-09 MED FILL — FUROSEMIDE 40 MG PO TABS: 40 MG | ORAL | Qty: 1

## 2013-07-09 MED FILL — NORMAL SALINE FLUSH 0.9 % IV SOLN: 0.9 % | INTRAVENOUS | Qty: 10

## 2013-07-09 MED FILL — SODIUM CHLORIDE 0.9 % IV SOLN: 0.9 % | INTRAVENOUS | Qty: 1000

## 2013-07-09 MED FILL — ROXICET 5-325 MG/5ML PO SOLN: 5-325 MG/5ML | ORAL | Qty: 10

## 2013-07-09 MED FILL — NORMAL SALINE FLUSH 0.9 % IV SOLN: 0.9 % | INTRAVENOUS | Qty: 20

## 2013-07-09 MED FILL — PROMETHAZINE HCL 25 MG/ML IJ SOLN: 25 MG/ML | INTRAMUSCULAR | Qty: 1

## 2013-07-09 MED FILL — TRANSDERM-SCOP (1.5 MG) 1 MG/3DAYS TD PT72: 1 MG/3DAYS | TRANSDERMAL | Qty: 1

## 2013-07-09 MED FILL — OMNIPAQUE 240 MG/ML IJ SOLN: 240 MG/ML | INTRAMUSCULAR | Qty: 50

## 2013-07-09 MED FILL — DIPHENHYDRAMINE HCL 50 MG/ML IJ SOLN: 50 MG/ML | INTRAMUSCULAR | Qty: 1

## 2013-07-09 MED FILL — LOVENOX 30 MG/0.3ML SC SOLN: 30 MG/0.3ML | SUBCUTANEOUS | Qty: 0.3

## 2013-07-09 MED FILL — MORPHINE SULFATE (PF) 2 MG/ML IV SOLN: 2 mg/mL | INTRAVENOUS | Qty: 1

## 2013-07-09 NOTE — Progress Notes (Signed)
Out of bed walking in hallway.  Gait steady. Family present.   Encouraged po intake.  Patient states her mouth is "so dry my lips are sticking to my teeth."  Encouraged small amounts often of water.  Advised patient to monitor for nausea and sip water.

## 2013-07-09 NOTE — Progress Notes (Signed)
VSS, pt states that she gets bradycardic and o2 sats drop in the evening at times here. Pt does not wear a cpap at home and does not know if this happens at home or not. Pt also has been having low grade temps, educated pt on the use of IS. Encouraged frequent use. Pt demonstrated x 10. Tolerated well. Will re-assess o2 throughout the night. Denies any needs at this time. Call light in reach, bed alarm on, family at the bedside.

## 2013-07-09 NOTE — Progress Notes (Signed)
PM assessment complete, see flowsheet. POC discussed with pt and family. Pt up in bed demonstrating correct use of IS. Tolerating well. Pt verbalizes understanding of plan. Denies any needs at this time. Call light in reach, bed alarm on, will continue to monitor.

## 2013-07-09 NOTE — Progress Notes (Signed)
Patient off continuous pulse oximeter per nursing. Patient's SpO2 is 94% on room air. May we discontinue this order?  Thank you,   York Ram, RRT

## 2013-07-09 NOTE — Progress Notes (Signed)
Reviewed CT scan with radiologist.    CT has ? Of gastric leak near cardia/ EG jxn.  See some contrast in the wall of stomach but no extravasation or abscess.    Discussed option of reoperation vs. Stenting.    I think in the face of the inflammation due to the band, this would be very difficult to fix.  The drain on CT appears to be in excellent position and there is no GI contents coming out of drain.     Will get GI to see her for potential to place partially covered stent for this to heal.    Start ABX.

## 2013-07-09 NOTE — Progress Notes (Signed)
Patient resting in room this am, BP 129/76   Pulse 71   Temp(Src) 101.2 ??F (38.4 ??C) (Oral)   Resp 16   Ht 5\' 6"  (1.676 m)   Wt 236 lb (107.049 kg)   BMI 38.11 kg/m2   SpO2 94%  No c/o nausea, c/o 7/10 abdominal pain and mild HA.  Made NPO per Dr. Suzi Roots and CT chest/abdomen ordered.  #20 gauge IV started, NS 75 ml/hr infusing.  Daughters at bedside, call light in reach.

## 2013-07-09 NOTE — Progress Notes (Signed)
Pt resting in bed with eyes closed. No s/s of distress. Breathing is easy and unlabored. Call light in reach, will continue to monitor

## 2013-07-09 NOTE — Progress Notes (Signed)
[place] [facility] PROGRESS NOTE    07/09/2013 9:04 AM        Name: UNDRA TREMBATH .              Admitted: 07/05/2013  Primary Care Provider: Margart Sickles SLOUGH, MD (Tel: [phone])      Subjective:  Marland Kitchen    Breathing much better.  Having fevers and associated chills and myalgias.  Reports loose diarrhea.  No dysuria.    Reviewed interval ancillary notes    Current Medications    ondansetron (ZOFRAN-ODT) disintegrating tablet 4 mg Q6H PRN   furosemide (LASIX) tablet 40 mg Daily   morphine (PF) injection 2 mg Q2H PRN   oxyCODONE-acetaminophen (ROXICET) 5-325 MG/5ML solution 10 mL Q4H PRN   oxyCODONE-acetaminophen (ROXICET) 5-325 MG/5ML solution 5 mL Q4H PRN   sodium chloride flush 0.9 % injection 10 mL Q12H SCH   sodium chloride flush 0.9 % injection 10 mL PRN   acetaminophen (TYLENOL) tablet 650 mg Q4H PRN   naloxone (NARCAN) injection 0.4 mg PRN   scopolamine (TRANSDERM-SCOP) 1.5 MG 1 patch Q72H   enoxaparin (LOVENOX) injection 30 mg Q12H SCH   diphenhydrAMINE (BENADRYL) injection 25 mg Q6H PRN   promethazine (PHENERGAN) injection 12.5 mg Q6H PRN       Objective:  BP 129/76   Pulse 71   Temp(Src) 101.2 ??F (38.4 ??C) (Oral)   Resp 16   Ht 5\' 6"  (1.676 m)   Wt 236 lb (107.049 kg)   BMI 38.11 kg/m2   SpO2 98%    Intake/Output Summary (Last 24 hours) at 07/09/13 0904  Last data filed at 07/09/13 0500   Gross per 24 hour   Intake    325 ml   Output    795 ml   Net   -470 ml      Wt Readings from Last 3 Encounters:   07/09/13 236 lb (107.049 kg)   06/29/13 231 lb (104.781 kg)   06/12/13 225 lb (102.059 kg)       General appearance:  Appears comfortable  Eyes: Sclera clear. Pupils equal.  ENT: Moist oral mucosa. Trachea midline, no adenopathy.  Cardiovascular: Regular rhythm, normal S1, S2. No murmur. No edema in lower extremities  Respiratory: Not using accessory muscles. Good inspiratory effort. Still with bibasilar rales, no wheeze.   GI: Abdomen soft, mild left side tenderness, not distended, normal  bowel sounds.  Surgical incisions unremarkable, no drainage or erythema.  JP in place with serosanguinous fluid.  Musculoskeletal: No cyanosis in digits, neck supple  Neurology: CN 2-12 grossly intact. No speech or motor deficits  Psych: Normal affect. Alert and oriented in time, place and person  Skin: Warm, dry, normal turgor    Labs and Tests:  CBC:   Recent Labs      07/07/13   0912  07/08/13   0546  07/09/13   0554   WBC  8.5  6.8  5.0   HGB  11.9*  12.2  11.9*   PLT  165  154  169     BMP:    Recent Labs      07/08/13   0546  07/09/13   0553   NA  138  137   K  3.0*  3.9   CL  98*  99   CO2  31  35*   BUN  10  8   CREATININE  0.7  0.8   GLUCOSE  110*  111*  Hepatic: No results found for this basename: AST, ALT, ALB, BILITOT, ALKPHOS,  in the last 72 hours  Problem List  Principal Problem:    Complication of gastric banding  Active Problems:    Morbid obesity       Assessment & Plan:   1. Postoperative pulmonary edema with hypoxia: resolved.  No further lasix needed.  Tolerating room air.  Continue IS, daily weights, monitor I&O.  2. Fever: check stool for cdiff.  CXR good.  Urine ok.  Blood cultures obtained.  CT abd/pelvis ordered by surgery.  3. Acute resp failure: resolved.  4. Hypokalemia: resolved.    Diet: Diet NPO Effective Now, Except for  Code:Full Code    Marquesa Rath, NP   07/09/2013 9:04 AM

## 2013-07-09 NOTE — Progress Notes (Signed)
Patient resting in bed.  Respirations even and unlabored.  No signs of respiratory distress.  No signs of pain noted.  Call light in easy view and reach.  Fall precautions in place.   Patient states she ate mashed potatoes, fruit, gram crackers, peanut butter, and denies nausea.

## 2013-07-09 NOTE — Progress Notes (Signed)
Patient having less pain today.  Nausea decreased.  +flatus and loose stool.  Ambulating in halls and doing better with IS.      Filed Vitals:    07/09/13 0654   BP:    Pulse:    Temp: 101.2 ??F (38.4 ??C)   Resp:      Abd: soft, decreased tenderness.  Pain localizing to left side incision where fascial defect was closed. No peritoneal signs.    AP:  S/p band removal for eroded band.    Patient with fever.  Not tachycardic.  WBCs are normal.  Clinincally she looks to be improving.  Her JP output is minimal.  Also is serosanguinous.  No bile or c/w GI leak.    Will get CT scan of chest/abdomen to r/o leak.  If this is negative, will consider treating for ? Mild UTI

## 2013-07-09 NOTE — Progress Notes (Signed)
Oral contrast started now, spoke with CT, plan to send her down at 1130

## 2013-07-10 MED ADMIN — enoxaparin (LOVENOX) injection 30 mg: SUBCUTANEOUS | @ 01:00:00 | NDC 00075801301

## 2013-07-10 MED ADMIN — piperacillin-tazobactam (ZOSYN) IVPB 3.375 g: INTRAVENOUS | @ 01:00:00 | NDC 00206886101

## 2013-07-10 MED ADMIN — 0.9 % sodium chloride infusion: INTRAVENOUS | @ 17:00:00 | NDC 00338004904

## 2013-07-10 MED ADMIN — piperacillin-tazobactam (ZOSYN) IVPB 3.375 g: INTRAVENOUS | @ 05:00:00 | NDC 00206886101

## 2013-07-10 MED ADMIN — piperacillin-tazobactam (ZOSYN) IVPB 3.375 g: INTRAVENOUS | @ 17:00:00 | NDC 00206886101

## 2013-07-10 MED ADMIN — promethazine (PHENERGAN) injection 12.5 mg: INTRAVENOUS | @ 18:00:00 | NDC 00641092821

## 2013-07-10 MED ADMIN — albuterol (PROVENTIL HFA;VENTOLIN HFA) 108 (90 BASE) MCG/ACT inhaler 2 puff: RESPIRATORY_TRACT | @ 23:00:00 | NDC 09999999900

## 2013-07-10 MED ADMIN — piperacillin-tazobactam (ZOSYN) IVPB 3.375 g: INTRAVENOUS | @ 22:00:00 | NDC 00206886101

## 2013-07-10 MED ADMIN — piperacillin-tazobactam (ZOSYN) IVPB 3.375 g: INTRAVENOUS | @ 10:00:00 | NDC 00206886101

## 2013-07-10 MED ADMIN — promethazine (PHENERGAN) injection 12.5 mg: INTRAVENOUS | @ 12:00:00 | NDC 00641092821

## 2013-07-10 MED ADMIN — enoxaparin (LOVENOX) injection 30 mg: SUBCUTANEOUS | @ 14:00:00 | NDC 00075801301

## 2013-07-10 MED ADMIN — furosemide (LASIX) tablet 40 mg: ORAL | @ 14:00:00 | NDC 00904579761

## 2013-07-10 MED ADMIN — morphine (PF) injection 2 mg: 2 mg | INTRAVENOUS | @ 18:00:00 | NDC 00409189001

## 2013-07-10 MED ADMIN — morphine (PF) injection 2 mg: 2 mg | INTRAVENOUS | @ 22:00:00 | NDC 00409189001

## 2013-07-10 MED ADMIN — morphine (PF) injection 2 mg: 2 mg | INTRAVENOUS | @ 12:00:00 | NDC 00409189001

## 2013-07-10 MED FILL — MORPHINE SULFATE (PF) 2 MG/ML IV SOLN: 2 mg/mL | INTRAVENOUS | Qty: 1

## 2013-07-10 MED FILL — ONDANSETRON 4 MG PO TBDP: 4 MG | ORAL | Qty: 1

## 2013-07-10 MED FILL — PROMETHAZINE HCL 25 MG/ML IJ SOLN: 25 MG/ML | INTRAMUSCULAR | Qty: 1

## 2013-07-10 MED FILL — FUROSEMIDE 40 MG PO TABS: 40 MG | ORAL | Qty: 1

## 2013-07-10 MED FILL — PROAIR HFA 108 (90 BASE) MCG/ACT IN AERS: 108 (90 Base) MCG/ACT | RESPIRATORY_TRACT | Qty: 8.5

## 2013-07-10 MED FILL — NORMAL SALINE FLUSH 0.9 % IV SOLN: 0.9 % | INTRAVENOUS | Qty: 10

## 2013-07-10 MED FILL — ZOSYN 3-0.375 GM/50ML IV SOLN: INTRAVENOUS | Qty: 50

## 2013-07-10 MED FILL — SODIUM CHLORIDE 0.9 % IV SOLN: 0.9 % | INTRAVENOUS | Qty: 1000

## 2013-07-10 MED FILL — LOVENOX 30 MG/0.3ML SC SOLN: 30 MG/0.3ML | SUBCUTANEOUS | Qty: 0.3

## 2013-07-10 NOTE — Progress Notes (Signed)
VSS. Pt O2 sats have been above 93. Pt wants to walk in halls. Ambulated in halls x 2 laps. Tolerated well. Back to bed. No complications. Denies any needs at this time. Call light in reach, bed alarm on, will continue to monitor.

## 2013-07-10 NOTE — Progress Notes (Signed)
Dr. Kaylor into see patient; see chart for notes and any orders.

## 2013-07-10 NOTE — Progress Notes (Signed)
Pt voiced need for pain medication. Pt medicated with morphine as ordered per pt request (See MAR) and phenergan for c/o nausea (See MAR) for pain rating 9/10. Will re-assess pain.

## 2013-07-10 NOTE — Progress Notes (Signed)
Dr. Northup into see patient; see chart for notes and any orders.

## 2013-07-10 NOTE — Progress Notes (Signed)
[place] [facility] PROGRESS NOTE    07/10/2013 10:46 PM        Name: Rachael Mays .              Admitted: 07/05/2013  Primary Care Provider: Margart Sickles SLOUGH, MD (Tel: [phone])      Subjective:  .  Seen earlier today.    Overall she is feeling much better than in previous days.  Breathing much better.  Fevers and chills are less.  No dysuria.    Reviewed interval ancillary notes    Current Medications    albuterol (PROVENTIL HFA;VENTOLIN HFA) 108 (90 BASE) MCG/ACT inhaler 2 puff Q4H PRN   0.9 % sodium chloride infusion Continuous   sodium chloride (PF) 0.9 % injection 20 mL PRN   piperacillin-tazobactam (ZOSYN) IVPB 3.375 g Q6H   [START ON 07/11/2013] scopolamine (TRANSDERM-SCOP) 1.5 MG 1 patch Q72H   ondansetron (ZOFRAN-ODT) disintegrating tablet 4 mg Q6H PRN   furosemide (LASIX) tablet 40 mg Daily   morphine (PF) injection 2 mg Q2H PRN   oxyCODONE-acetaminophen (ROXICET) 5-325 MG/5ML solution 10 mL Q4H PRN   oxyCODONE-acetaminophen (ROXICET) 5-325 MG/5ML solution 5 mL Q4H PRN   sodium chloride flush 0.9 % injection 10 mL Q12H SCH   sodium chloride flush 0.9 % injection 10 mL PRN   acetaminophen (TYLENOL) tablet 650 mg Q4H PRN   naloxone (NARCAN) injection 0.4 mg PRN   enoxaparin (LOVENOX) injection 30 mg Q12H SCH   diphenhydrAMINE (BENADRYL) injection 25 mg Q6H PRN   promethazine (PHENERGAN) injection 12.5 mg Q6H PRN         Objective:  Blood pressure 104/59, pulse 83, temperature 99.1 ??F (37.3 ??C), temperature source Oral, resp. rate 16, height 5\' 6"  (1.676 m), weight 236 lb (107.049 kg), SpO2 96 %.     Intake/Output Summary (Last 24 hours) at 07/10/13 2245  Last data filed at 07/10/13 1755   Gross per 24 hour   Intake   1195 ml   Output   2730 ml   Net  -1535 ml        General appearance:  Appears comfortable, sitting up in the chair.  Looks much better than in previous days.  Eyes: Sclera clear. Pupils equal.  ENT: Moist oral mucosa. Trachea midline, no adenopathy.  Cardiovascular: Regular  rhythm, normal S1, S2. No murmur. No edema in lower extremities  Respiratory: Not using accessory muscles. Good inspiratory effort. No wheeze, no rales.  Good aeration to all lung fields.   GI: Abdomen soft, mild left side tenderness, not distended, normal bowel sounds.  Surgical incisions unremarkable, no drainage or erythema.  JP in place with serosanguinous fluid.  Musculoskeletal: No cyanosis in digits, neck supple  Neurology: CN 2-12 grossly intact. No speech or motor deficits  Psych: Normal affect. Alert and oriented in time, place and person  Skin: Warm, dry, normal turgor    Labs and Tests:  CBC:   Recent Labs      07/07/13   0912  07/08/13   0546  07/09/13   0554   WBC  8.5  6.8  5.0   HGB  11.9*  12.2  11.9*   PLT  165  154  169     BMP:    Recent Labs      07/08/13   0546  07/09/13   0553   NA  138  137   K  3.0*  3.9   CL  98*  99  CO2  31  35*   BUN  10  8   CREATININE  0.7  0.8   GLUCOSE  110*  111*     Hepatic: No results found for this basename: AST, ALT, ALB, BILITOT, ALKPHOS,  in the last 72 hours  Problem List  Principal Problem:    Complication of gastric banding  Active Problems:    Morbid obesity       Assessment & Plan:   1. Postoperative pulmonary edema with hypoxia: resolved.  No further lasix needed.  Tolerating room air.    2. Fever: CT chest showed RUL pneumonia vs aspiration pneumonitis.  On Zosyn.  Fevers improving.  Transition to PO augmentin on discharge.. Could prescribe the compounded liquid formulation.  3. Possible gastric leak: GI will scope tomorrow and place a stent if needed.  4. Acute resp failure: resolved.  5. Hypokalemia: resolved.    Diet: Diet NPO Effective Now, Except for  Code:Full Code    07/10/2013 10:44 PM

## 2013-07-10 NOTE — Progress Notes (Signed)
Per an RN patient given IV Morphine for c/o abdomen pain.

## 2013-07-10 NOTE — Progress Notes (Signed)
Pt back in bed, in to make sure pt was positioned comfortably. Bed alarm reactivated. Denies any needs at this time. Call light in reach

## 2013-07-10 NOTE — Consults (Signed)
Gastroenterology Consult Note      Patient: Rachael Mays  DOB: 10/06/1956  Acct#:    Req: Dr. Suzi Roots  Indication: complication of gastric band      Date:  07/10/2013    Subjective:       History of Present Illness  Patient is a 57 yo  female admitted with COMPLICATIONS OF GASTRIC BAND who is seen in consult for complication of gastric band. She had a remote gastric band and then was having pain.  She was subsequently discovered to have a band erosion.  She was taken to the OR on 7/30 for removal of the hardware.  There was a lot of scar tissue in the area but the device was removed. She subsequently had fevers and although UGI did not reveal any leak, CT showed some contrast in the gastric wall but no leak.  We are asked to consider esophageal stent if possible.       She does have some abdominal pain but says it is better a little each day. She is also having nausea.         Past Medical History   Diagnosis Date   ??? Anxiety    ??? Asthma    ??? Chronic back pain    ??? Deep vein thrombosis    ??? Depression    ??? GERD (gastroesophageal reflux disease)      NO LONGER SINCE LAP   ??? Pulmonary embolism (HCC)    ??? Obesity      hx of; had lap band   ??? Hyperlipidemia      hx; resolved with lap band   ??? GERD (gastroesophageal reflux disease) 01/23/2009   ??? Hypertension       Past Surgical History   Procedure Laterality Date   ??? Colonoscopy     ??? Upper gastrointestinal endoscopy     ??? Cesarean section     ??? Cholecystectomy     ??? Hysterectomy     ??? Shoulder surgery     ??? Lap band  05/01/08     Dr. Suzi Roots   ??? Back surgery       neck  plates   ??? Colonoscopy  04/08/10   ??? Spine surgery     ??? Other surgical history       [place] Filter: curently in place   ??? Upper gastrointestinal endoscopy  05/19/13   ??? Other surgical history  07/05/13     lap band removal          Admission Meds  No current facility-administered medications on file prior to encounter.     Current Outpatient Prescriptions on File Prior to Encounter      Medication Sig Dispense Refill   ??? hydrochlorothiazide (HYDRODIURIL) 25 MG tablet Take 1 tablet by mouth daily.  90 tablet  1   ??? ranitidine (ZANTAC) 150 MG tablet Take 150 mg by mouth 2 times daily.       ??? montelukast (SINGULAIR) 10 MG tablet Take 1 tablet by mouth daily.  30 tablet  3   ??? fluocinonide (LIDEX) 0.05 % cream Apply topically 2 times daily.  1 Tube  1   ??? Wound Dressings (INTERDRY AG TEXTILE 10"X12') MISC Apply 1 each topically daily. Pt would like a call with price before filled  1 each  1   ??? gabapentin (NEURONTIN) 300 MG capsule TAKE ONE CAPSULE BY MOUTH NIGHTLY  90 capsule  0   ??? HYDROcodone-acetaminophen (NORCO) 5-325 MG per  tablet Take 1 tablet by mouth every 4 hours as needed for Pain.  30 tablet  0   ??? cetirizine (ZYRTEC ALLERGY) 10 MG tablet Take 10 mg by mouth daily.       ??? ALPRAZolam (XANAX) 0.5 MG tablet Take 1 tablet by mouth 2 times daily as needed for Sleep or Anxiety.  60 tablet  0   ??? escitalopram (LEXAPRO) 20 MG tablet Take 20 mg by mouth nightly.       ??? calcium carbonate 600 MG TABS tablet Take 1 tablet by mouth 2 times daily.         ??? Vitamin D (CHOLECALCIFEROL) 1000 UNITS CAPS capsule Take 2,000 Units by mouth daily.       ??? Multiple Vitamin (MULTIVITAMIN PO) Take 1 tablet by mouth daily.       ??? albuterol (PROVENTIL;VENTOLIN) 90 MCG/ACT inhaler Inhale 2 puffs into the lungs every 6 hours as needed.       ??? Vitamins-Lipotropics (LIPOFLAVONOID) TABS Take two tablets by mouth three times a day for 60 days, then one tablet three times a day.  60 tablet  0           Allergies  Allergies   Allergen Reactions   ??? Nystatin Other (See Comments)     Yeast infection     ??? Talwin [Pentazocine] Other (See Comments)     dizzy      Social   History   Substance Use Topics   ??? Smoking status: Former Smoker -- 0.50 packs/day for 35 years     Types: Cigarettes     Quit date: 08/07/2012   ??? Smokeless tobacco: Never Used   ??? Alcohol Use: No        Family History   Problem Relation Age of Onset    ??? Arthritis Mother    ??? Cancer Mother    ??? Depression Mother    ??? Heart Disease Mother    ??? High Blood Pressure Mother    ??? Heart Disease Maternal Grandmother    ??? Early Death Paternal Grandmother    ??? Heart Disease Paternal Grandfather    ??? Diabetes Other    ??? High Blood Pressure Other    ??? Obesity Other    ??? Heart Disease Mother 54     MI    ??? Ovarian Cancer Mother 41   ??? Cancer Father 74     colon          Review of Systems  10 systems neg except for that in hpi       Physical Exam  Blood pressure 118/70, pulse 65, temperature 99.2 ??F (37.3 ??C), temperature source Oral, resp. rate 16, height 5\' 6"  (1.676 m), weight 236 lb (107.049 kg), SpO2 92 %.    General appearance: alert, cooperative, no distress, appears stated age  Eyes: Anicteric  Head: Normocephalic, without obvious abnormality  Lungs: clear to auscultation bilaterally, Normal Effort  Heart: regular rate and rhythm, normal S1 and S2, no murmurs or rubs  Abdomen: soft, epigastric tenderness no rebound. Bowel sounds normal. No masses,  no organomegaly.  + scars and incisions.    Extremities: atraumatic, no cyanosis or edema  Skin: warm and dry, no jaundice  Neuro: Grossly intact, A&OX3  5/5 grip bue  Mood good affect congruent  No rash      Data Review:    Recent Labs      07/07/13   0912  07/08/13   0546  07/09/13   0554   WBC  8.5  6.8  5.0   HGB  11.9*  12.2  11.9*   HCT  36.5  36.8  36.0   MCV  86.5  85.8  84.8   PLT  165  154  169     Recent Labs      07/08/13   0546  07/09/13   0553   NA  138  137   K  3.0*  3.9   CL  98*  99   CO2  31  35*   BUN  10  8   CREATININE  0.7  0.8       Ct report noted.              Assessment:     Principal Problem:    Complication of gastric banding  Active Problems:    Morbid obesity    1. Complication of gastric banding- gastric band erosion s/p operative removal of device.  There was no leak on imaging but pt with fevers and some contrast in the gastric wall.        Recommendations:     1. Will review imaging with  radiology to see if the area in question could be covered by an esophageal stent.   2. If so an EGD with stent can be attempted tomorrow.  If not, may benefit from TPN and npo status for several days then repeat imaging.     Kellie Moor, MD  [facility]

## 2013-07-10 NOTE — Progress Notes (Signed)
Per an RN patient given IV Phenergan for c/o nausea and IV Dilaudid for c/o abdomen pain. Patient is up in a chair. Daughter remains at bedside. Call light in reach. Patient remains NPO. Patient expresses no other needs.

## 2013-07-10 NOTE — Progress Notes (Signed)
Patient "feeling better each day".  Ambulating and using IS.  Pain is decreased.  However, she describes her pain as a 9 but she is sitting in a chair watching TV and appears very comfortable.  Feels very hungry.  Mild nausea, but is decreased.      Filed Vitals:    07/10/13 0646   BP: 118/70   Pulse: 65   Temp: 99.2 ??F (37.3 ??C)   Resp: 16         Abd:  Soft minimal incision tenderness/ND.  Wounds healing well      AP:  S/p band removal   No further fever. Vitals stable.  Serous JP output.  ? Of leak on CT scan     Appreciate Dr Celedonio Miyamoto seeing patient.  Evaluate for placement of a stent as soon as possible.

## 2013-07-10 NOTE — Progress Notes (Signed)
Patient is resting in bed. Daily assessment completed. Patient given AM medications. Patient remains NPO; patient has mouth swabs at bedside for mouth care. Family at bedside. IS encouraged. Patient expresses no needs at this time. Call light in reach.

## 2013-07-10 NOTE — Progress Notes (Signed)
Per an RN patient given IV Morphine for c/o abdomen pain and IV Phenergan for c/o nausea.

## 2013-07-10 NOTE — Progress Notes (Signed)
Pt up in halls ambulating with daughter. Steady gait. Tolerating well.

## 2013-07-10 NOTE — Progress Notes (Signed)
In to re-assess pain, pt now rates pain 4/10, states relief from medication given. Denies any needs at this time. Call light in reach, bed alarm on, daughter at the bedside.

## 2013-07-10 NOTE — Progress Notes (Signed)
PM assessment complete, see flowsheet. POC discussed with pt and family. Both verbalize understanding. Denies any needs at this time. Call light in reach, bed alarm on, will continue to monitor.

## 2013-07-11 MED ORDER — LIDOCAINE HCL (PF) 1 % IJ SOLN
1 | Freq: Once | INTRAMUSCULAR | Status: AC | PRN
Start: 2013-07-11 — End: 2013-07-11

## 2013-07-11 MED ADMIN — furosemide (LASIX) tablet 40 mg: ORAL | @ 13:00:00 | NDC 00904579761

## 2013-07-11 MED ADMIN — 0.9 % sodium chloride infusion: INTRAVENOUS | @ 13:00:00 | NDC 00338004904

## 2013-07-11 MED ADMIN — enoxaparin (LOVENOX) injection 30 mg: SUBCUTANEOUS | NDC 00075801301

## 2013-07-11 MED ADMIN — piperacillin-tazobactam (ZOSYN) IVPB 3.375 g: INTRAVENOUS | @ 05:00:00 | NDC 00206886101

## 2013-07-11 MED ADMIN — promethazine (PHENERGAN) injection 12.5 mg: INTRAVENOUS | @ 02:00:00 | NDC 00641092821

## 2013-07-11 MED ADMIN — piperacillin-tazobactam (ZOSYN) IVPB 3.375 g: INTRAVENOUS | @ 17:00:00 | NDC 00206886101

## 2013-07-11 MED ADMIN — albuterol (PROVENTIL HFA;VENTOLIN HFA) 108 (90 BASE) MCG/ACT inhaler 2 puff: RESPIRATORY_TRACT | @ 06:00:00 | NDC 00173068220

## 2013-07-11 MED ADMIN — promethazine (PHENERGAN) injection 12.5 mg: INTRAVENOUS | @ 22:00:00 | NDC 00641092821

## 2013-07-11 MED ADMIN — piperacillin-tazobactam (ZOSYN) IVPB 3.375 g: INTRAVENOUS | @ 10:00:00 | NDC 00206886101

## 2013-07-11 MED ADMIN — enoxaparin (LOVENOX) injection 30 mg: SUBCUTANEOUS | @ 13:00:00 | NDC 00075801301

## 2013-07-11 MED ADMIN — promethazine (PHENERGAN) injection 12.5 mg: INTRAVENOUS | @ 10:00:00 | NDC 00641092821

## 2013-07-11 MED ADMIN — morphine (PF) injection 2 mg: 2 mg | INTRAVENOUS | @ 02:00:00 | NDC 00409189001

## 2013-07-11 MED ADMIN — morphine (PF) injection 2 mg: 2 mg | INTRAVENOUS | @ 05:00:00 | NDC 00409189001

## 2013-07-11 MED ADMIN — morphine (PF) injection 4 mg: 4 mg | INTRAVENOUS | @ 15:00:00 | NDC 00409189101

## 2013-07-11 MED ADMIN — morphine (PF) injection 4 mg: 4 mg | INTRAVENOUS | @ 22:00:00 | NDC 00409189101

## 2013-07-11 MED ADMIN — morphine (PF) injection 4 mg: 4 mg | INTRAVENOUS | @ 07:00:00 | NDC 00409189101

## 2013-07-11 MED ADMIN — morphine (PF) injection 4 mg: 4 mg | INTRAVENOUS | @ 10:00:00 | NDC 00409189101

## 2013-07-11 MED FILL — PROMETHAZINE HCL 25 MG/ML IJ SOLN: 25 MG/ML | INTRAMUSCULAR | Qty: 1

## 2013-07-11 MED FILL — FUROSEMIDE 40 MG PO TABS: 40 MG | ORAL | Qty: 1

## 2013-07-11 MED FILL — LOVENOX 30 MG/0.3ML SC SOLN: 30 MG/0.3ML | SUBCUTANEOUS | Qty: 0.3

## 2013-07-11 MED FILL — NORMAL SALINE FLUSH 0.9 % IV SOLN: 0.9 % | INTRAVENOUS | Qty: 10

## 2013-07-11 MED FILL — MORPHINE SULFATE (PF) 4 MG/ML IV SOLN: 4 mg/mL | INTRAVENOUS | Qty: 1

## 2013-07-11 MED FILL — SODIUM CHLORIDE 0.9 % IV SOLN: 0.9 % | INTRAVENOUS | Qty: 1000

## 2013-07-11 MED FILL — CLINIMIX E/DEXTROSE (5/20) 5 % IV SOLN: 5 % | INTRAVENOUS | Qty: 2000

## 2013-07-11 MED FILL — MORPHINE SULFATE (PF) 2 MG/ML IV SOLN: 2 mg/mL | INTRAVENOUS | Qty: 1

## 2013-07-11 NOTE — Progress Notes (Signed)
[place] [facility] PROGRESS NOTE    07/11/2013 2:23 PM        Name: Rachael Mays .              Admitted: 07/05/2013  Primary Care Provider: Margart Sickles SLOUGH, MD (Tel: [phone])      Subjective:  .  C/o some intermittent nausea and incisional pain, but comfortable currently. Family present in the room.    Reviewed interval ancillary notes    Current Medications    morphine (PF) injection 4 mg Q3H PRN   lidocaine 1 % (PF) injection 5 mL Once PRN   sodium chloride flush 0.9 % injection 10 mL Q12H SCH   sodium chloride flush 0.9 % injection 10 mL PRN   montelukast (SINGULAIR) tablet 10 mg Daily   escitalopram (LEXAPRO) tablet 10 mg Nightly   PN-Adult Premix 5/20 - Standard Electrolytes - Central Line Continuous TPN   glucose (GLUTOSE) 40 % oral gel 15 g PRN   dextrose 50 % solution 12.5 g PRN   glucagon (rDNA) injection 1 mg PRN   dextrose 5 % solution PRN   insulin lispro (HUMALOG) injection pen 0-6 Units Q4H   albuterol (PROVENTIL HFA;VENTOLIN HFA) 108 (90 BASE) MCG/ACT inhaler 2 puff Q4H PRN   0.9 % sodium chloride infusion Continuous   sodium chloride (PF) 0.9 % injection 20 mL PRN   piperacillin-tazobactam (ZOSYN) IVPB 3.375 g Q6H   scopolamine (TRANSDERM-SCOP) 1.5 MG 1 patch Q72H   ondansetron (ZOFRAN-ODT) disintegrating tablet 4 mg Q6H PRN   furosemide (LASIX) tablet 40 mg Daily   morphine (PF) injection 2 mg Q2H PRN   oxyCODONE-acetaminophen (ROXICET) 5-325 MG/5ML solution 10 mL Q4H PRN   oxyCODONE-acetaminophen (ROXICET) 5-325 MG/5ML solution 5 mL Q4H PRN   sodium chloride flush 0.9 % injection 10 mL Q12H SCH   sodium chloride flush 0.9 % injection 10 mL PRN   acetaminophen (TYLENOL) tablet 650 mg Q4H PRN   naloxone (NARCAN) injection 0.4 mg PRN   enoxaparin (LOVENOX) injection 30 mg Q12H SCH   diphenhydrAMINE (BENADRYL) injection 25 mg Q6H PRN   promethazine (PHENERGAN) injection 12.5 mg Q6H PRN       Objective:  BP 120/70   Pulse 72   Temp(Src) 99.4 ??F (37.4 ??C) (Oral)   Resp 16   Ht 5'  6" (1.676 m)   Wt 236 lb (107.049 kg)   BMI 38.11 kg/m2   SpO2 93%    Intake/Output Summary (Last 24 hours) at 07/11/13 1423  Last data filed at 07/11/13 1400   Gross per 24 hour   Intake   1856 ml   Output   3540 ml   Net  -1684 ml    Wt Readings from Last 3 Encounters:   07/09/13 236 lb (107.049 kg)   06/29/13 231 lb (104.781 kg)   06/12/13 225 lb (102.059 kg)       General appearance:  Appears comfortable  Eyes: Sclera clear. Pupils equal.  ENT: Moist oral mucosa. Trachea midline, no adenopathy.  Cardiovascular: Regular rhythm, normal S1, S2. No murmur. No edema in lower extremities  Respiratory: Not using accessory muscles. Good inspiratory effort. Clear to auscultation bilaterally, no wheeze or crackles.   GI: Abdomen soft,  Epigastric surgical drain site tenderness, not distended, normal bowel sounds, small amount of serosanginous drainage in collection device  Musculoskeletal: No cyanosis in digits, neck supple  Neurology: CN 2-12 grossly intact. No speech or motor deficits  Psych: Normal affect. Alert and  oriented in time, place and person  Skin: Warm, dry, normal turgor    Labs and Tests:  CBC:   Recent Labs      07/09/13   0554   WBC  5.0   HGB  11.9*   PLT  169     BMP:  Recent Labs      07/09/13   0553   NA  137   K  3.9   CL  99   CO2  35*   BUN  8   CREATININE  0.8   GLUCOSE  111*     Hepatic: No results found for this basename: AST, ALT, ALB, BILITOT, ALKPHOS,  in the last 72 hours    Discussed care with family  I viewed Chest CT results:  Sm. Bilateral Pleural Effusions with RUQ Airspace     Problem List  Principal Problem:    Complication of gastric banding  Active Problems:    Morbid obesity       Assessment & Plan:   1. Complications of Gastric Banding s/p removal of band: To remain NPO with TPN started per Dr. Celedonio Miyamoto  2.   Possible RUQ Pneumonia vs.atelectasis:  Continue Zosyn Day # 2  3.   DVT/ Pul Emboli prophylaxis: on Lovenox BID         2. Diet: Diet NPO, After Midnight, Except For  PN-Adult  Premix 5/20 - Standard Electrolytes - Central Line  Code:Full Code    MADONNA DECHRISTOPHER, NP   07/11/2013 2:23 PM

## 2013-07-11 NOTE — Progress Notes (Signed)
Pain medication given per pt request see MAR. Valetta Fuller

## 2013-07-11 NOTE — Progress Notes (Signed)
Pt voiced need for pain medication. Pt medicated with morphine as ordered per pt request (See MAR) for pain rating 10/10. Ice pack refreshed, pt demonstrating correct use of IS x 10. Encouraged frequent use. Denies any needs at this time. Call light in reach, bed alarm on.

## 2013-07-11 NOTE — Progress Notes (Signed)
Patient is resting comfortably. No c/o pain. Family remains at bedside. Patient continues to use IS and ambulate. No needs expressed.

## 2013-07-11 NOTE — Progress Notes (Signed)
Consent signed for PICC line placement.

## 2013-07-11 NOTE — Progress Notes (Signed)
Shift assessment completed. No s/s of distress noted. VSS. No complaints of pain at this time. Bed locked and in low position. Side rails up x 2. Bedside table and call light within reach. Pt states no further needs at this time. Will continue to monitor. Allexis Bordenave L Aashi Derrington

## 2013-07-11 NOTE — Plan of Care (Signed)
Problem: PAIN  Goal: Patient???s pain/discomfort is manageable  Outcome: Ongoing  Pt able to communicate pain using 0-10 pain scale. Medications given per pt request as ordered for pain. Pt states pain is managed thus far with medications given. Will continue to monitor and assess pain.

## 2013-07-11 NOTE — Progress Notes (Signed)
Message sent to hospitalist for pain 10/10. Awaiting return call.

## 2013-07-11 NOTE — Consults (Signed)
Clinical Pharmacy Note:    Pharmacy consulted to dose TPN.     Dietician recs: pending    Will start central standard Clinimix E 5/20 TPN @ 40 ml/hr. K, Mg, Phos levels ordered daily. POC glucose orderd q4h as well as low dose sliding scale insulin for coverage.     Will monitor daily and adjust as needed.     Kalika Smay PharmD, BCPS

## 2013-07-11 NOTE — Progress Notes (Signed)
Per an RN patient given IV Morphine for c/o abdomen pain and IV Phenergan for c/o nausea.

## 2013-07-11 NOTE — Progress Notes (Signed)
Patient is resting in bed. Daily assessment completed. Patient given AM medication with explanations on each. Patient denies any pain at this time. Call light in reach. Family at bedside. Patient expresses no needs. Patient remains NPO. JP drain intact.

## 2013-07-11 NOTE — Progress Notes (Signed)
Air mattress set up for patient d/t c/o the regular bed hurting her back and coccyx.

## 2013-07-11 NOTE — Progress Notes (Signed)
Dr. Northup into see patient; see chart for notes and any orders.

## 2013-07-11 NOTE — Progress Notes (Signed)
Since dose of morphine increased to 4mg , pt states pain is much more controlled. Up in bed watching tv, denies any needs at this time. Call light in reach, bed alarm on, will continue to monitor.

## 2013-07-11 NOTE — Progress Notes (Signed)
Call back received from hospitalist. Orders given to increase morphine to 4mg . Updated pt and husband.

## 2013-07-11 NOTE — Progress Notes (Signed)
Medical NP Madonna into see patient; see chart for notes and any orders.

## 2013-07-11 NOTE — Progress Notes (Signed)
Per an RN patient given IV Morphine for c/o abdomen pain.

## 2013-07-11 NOTE — Plan of Care (Signed)
Problem: SAFETY  Goal: Free from accidental physical injury  No injury noted.    Problem: PAIN  Goal: Patient???s pain/discomfort is manageable  Patient is able to rate pain on 0-10 pain scale. Pain level tolerable with PRN pain medications. Patient aware of medications, side effects and PRN schedule. Will continue to assess.        Problem: SKIN INTEGRITY  Goal: Skin integrity is maintained or improved  See doc flow sheets.    Problem: Nausea/Vomiting  Goal: Absence of nausea/vomiting  scoplolamine patch in place. No complaints of either.

## 2013-07-11 NOTE — Progress Notes (Signed)
Patient has some incisional pain at left side incision.  Unchanged from yesterday.  No n/v.  Ambulating and using IS    Filed Vitals:    07/11/13 0719   BP: 120/70   Pulse: 72   Temp: 99.4 ??F (37.4 ??C)   Resp: 16     Abd: soft.  Pain at left incision only.  ND      AP:  S/p band removal    Discussed EGD/ Stent w/ Dr. Celedonio Miyamoto.  After further review, he does not think the stent would be beneficial with the location of the ? Leak on CT  Will plan on PICC placment with TPN  Set up for home tpn and abx

## 2013-07-11 NOTE — Progress Notes (Signed)
Dr. Kaylor into see patient; see chart for notes and any orders.

## 2013-07-11 NOTE — Progress Notes (Signed)
[facility]  Gastroenterology Progress Note  Rachael Mays is a 57 y.o. female patient.  No diagnosis found.    SUBJECTIVE:  Some "soreness" in the abdomen. nausea    Physical    VITALS:  BP 120/70   Pulse 72   Temp(Src) 99.4 ??F (37.4 ??C) (Oral)   Resp 16   Ht 5\' 6"  (1.676 m)   Wt 236 lb (107.049 kg)   BMI 38.11 kg/m2   SpO2 93%  TEMPERATURE:  Current - Temp: 99.4 ??F (37.4 ??C); Max - Temp  Avg: 99.5 ??F (37.5 ??C)  Min: 99 ??F (37.2 ??C)  Max: 100.8 ??F (38.2 ??C)    Abdomen soft, ND, mild ttp. No rebound, no HSM, Bowel sounds normal   aaox 3 anicteric no jaundice  5/5 grip bue.   rrr  Data      Recent Labs      07/09/13   0554   WBC  5.0   HGB  11.9*   HCT  36.0   MCV  84.8   PLT  169     Recent Labs      07/09/13   0553   NA  137   K  3.9   CL  99   CO2  35*   BUN  8   CREATININE  0.8         ASSESSMENT   1. Complication of gastric band- small defect in gastric mucosa but it is out of reach of an esophageal stent.  D/w pt and Dr. Suzi Roots        PLAN    1. Npo, tpn, conservative mgt.  Repeat imaging in the future.    2. Call with questions.     Kellie Moor, MD  [facility]

## 2013-07-11 NOTE — Progress Notes (Signed)
IV removed d/t leaking and redness at site. New IV placed in R FA. No complications. Fluids reconnected and infusing.

## 2013-07-11 NOTE — Progress Notes (Signed)
Patient is resting comfortably with no c/o pain at this time. JP drain remains in place and draining. Family is at bedside. Call light in reach. Patient remains NPO. The PICC nurse is on the floor and will be starting patients PICC line shortly. Patient expresses no needs at this time.Marland Kitchen

## 2013-07-12 LAB — BASIC METABOLIC PANEL
BUN: 6 mg/dL — ABNORMAL LOW (ref 7–20)
CO2: 31 mmol/L (ref 21–32)
Calcium: 8.1 mg/dL — ABNORMAL LOW (ref 8.3–10.6)
Chloride: 99 mmol/L (ref 99–110)
Creatinine: 0.5 mg/dL — ABNORMAL LOW (ref 0.6–1.1)
GFR African American: >60 (ref >60)
GFR Non-African American: >60 (ref >60)
Glucose: 135 mg/dL — ABNORMAL HIGH (ref 70–99)
Potassium: 3.1 mmol/L — ABNORMAL LOW (ref 3.5–5.1)
Sodium: 136 mmol/L (ref 136–145)

## 2013-07-12 LAB — COMPREHENSIVE METABOLIC PANEL
ALT: 38 U/L (ref 10–40)
AST: 30 U/L (ref 15–37)
Albumin/Globulin Ratio: 1 — ABNORMAL LOW (ref 1.1–2.2)
Albumin: 2.9 g/dL — ABNORMAL LOW (ref 3.4–5.0)
Alkaline Phosphatase: 104 U/L (ref 40–129)
BUN: 6 mg/dL — ABNORMAL LOW (ref 7–20)
CO2: 30 mmol/L (ref 21–32)
Calcium: 8.1 mg/dL — ABNORMAL LOW (ref 8.3–10.6)
Chloride: 100 mmol/L (ref 99–110)
Creatinine: 0.6 mg/dL (ref 0.6–1.1)
GFR African American: >60 (ref >60)
GFR Non-African American: >60 (ref >60)
Globulin: 2.8 g/dL
Glucose: 143 mg/dL — ABNORMAL HIGH (ref 70–99)
Potassium: 2.6 mmol/L — CL (ref 3.5–5.1)
Sodium: 135 mmol/L — ABNORMAL LOW (ref 136–145)
Total Bilirubin: 0.6 mg/dL (ref 0.0–1.0)
Total Protein: 5.7 g/dL — ABNORMAL LOW (ref 6.4–8.2)

## 2013-07-12 LAB — POCT GLUCOSE
POC Glucose: 75 mg/dl (ref 70–99)
POC Glucose: 120 mg/dl — ABNORMAL HIGH (ref 70–99)
POC Glucose: 119 mg/dl — ABNORMAL HIGH (ref 70–99)
POC Glucose: 123 mg/dl — ABNORMAL HIGH (ref 70–99)
POC Glucose: 122 mg/dl — ABNORMAL HIGH (ref 70–99)
POC Glucose: 117 mg/dl — ABNORMAL HIGH (ref 70–99)

## 2013-07-12 LAB — PHOSPHORUS: Phosphorus: 2.5 mg/dL (ref 2.5–4.9)

## 2013-07-12 LAB — MAGNESIUM: Magnesium: 2 mg/dL (ref 1.80–2.40)

## 2013-07-12 MED ADMIN — PN-Adult Premix 5/20 - Standard Electrolytes - Central Line: INTRAVENOUS | @ 01:00:00 | NDC 00338112504

## 2013-07-12 MED ADMIN — enoxaparin (LOVENOX) injection 30 mg: SUBCUTANEOUS | @ 14:00:00 | NDC 00075801301

## 2013-07-12 MED ADMIN — potassium chloride 20 mEq/50 mL IVPB (Central Line): INTRAVENOUS | @ 17:00:00 | NDC 00338070341

## 2013-07-12 MED ADMIN — piperacillin-tazobactam (ZOSYN) IVPB 3.375 g: INTRAVENOUS | @ 19:00:00 | NDC 00206886101

## 2013-07-12 MED ADMIN — piperacillin-tazobactam (ZOSYN) IVPB 3.375 g: INTRAVENOUS | @ 01:00:00 | NDC 00206886101

## 2013-07-12 MED ADMIN — escitalopram (LEXAPRO) tablet 10 mg: ORAL | @ 01:00:00 | NDC 68084061711

## 2013-07-12 MED ADMIN — enoxaparin (LOVENOX) injection 30 mg: SUBCUTANEOUS | @ 01:00:00 | NDC 00075801301

## 2013-07-12 MED ADMIN — 0.9 % sodium chloride infusion: INTRAVENOUS | @ 05:00:00 | NDC 00338004904

## 2013-07-12 MED ADMIN — PN-Adult Premix 5/20 - Standard Electrolytes - Central Line: INTRAVENOUS | @ 22:00:00 | NDC 63323014397

## 2013-07-12 MED ADMIN — 0.9 % sodium chloride infusion: INTRAVENOUS | @ 16:00:00 | NDC 00338004904

## 2013-07-12 MED ADMIN — piperacillin-tazobactam (ZOSYN) IVPB 3.375 g: INTRAVENOUS | @ 05:00:00 | NDC 00206886101

## 2013-07-12 MED ADMIN — montelukast (SINGULAIR) tablet 10 mg: ORAL | @ 01:00:00 | NDC 68084062011

## 2013-07-12 MED ADMIN — sodium chloride flush 0.9 % injection 10 mL: INTRAVENOUS | @ 01:00:00

## 2013-07-12 MED ADMIN — furosemide (LASIX) tablet 40 mg: ORAL | @ 14:00:00 | NDC 00904579761

## 2013-07-12 MED ADMIN — piperacillin-tazobactam (ZOSYN) IVPB 3.375 g: INTRAVENOUS | @ 10:00:00 | NDC 00206886101

## 2013-07-12 MED ADMIN — montelukast (SINGULAIR) tablet 10 mg: ORAL | @ 14:00:00 | NDC 68084062011

## 2013-07-12 MED ADMIN — potassium chloride 20 mEq/50 mL IVPB (Central Line): INTRAVENOUS | @ 20:00:00 | NDC 00338070341

## 2013-07-12 MED ADMIN — scopolamine (TRANSDERM-SCOP) 1.5 MG 1 patch: TRANSDERMAL | @ 02:00:00 | NDC 10019055388

## 2013-07-12 MED ADMIN — promethazine (PHENERGAN) injection 12.5 mg: INTRAVENOUS | @ 16:00:00 | NDC 00641092821

## 2013-07-12 MED ADMIN — morphine (PF) injection 4 mg: 4 mg | INTRAVENOUS | @ 14:00:00 | NDC 00409189101

## 2013-07-12 MED ADMIN — morphine (PF) injection 4 mg: 4 mg | INTRAVENOUS | @ 02:00:00 | NDC 00409189101

## 2013-07-12 MED ADMIN — morphine (PF) injection 4 mg: 4 mg | INTRAVENOUS | @ 06:00:00 | NDC 00409189101

## 2013-07-12 MED ADMIN — morphine (PF) injection 4 mg: 4 mg | INTRAVENOUS | @ 10:00:00 | NDC 00409189101

## 2013-07-12 MED ADMIN — morphine (PF) injection 4 mg: 4 mg | INTRAVENOUS | @ 19:00:00 | NDC 00409189101

## 2013-07-12 MED ADMIN — morphine (PF) injection 4 mg: 4 mg | INTRAVENOUS | @ 22:00:00 | NDC 00409189101

## 2013-07-12 MED FILL — ZOSYN 3-0.375 GM/50ML IV SOLN: INTRAVENOUS | Qty: 50

## 2013-07-12 MED FILL — SINGULAIR 10 MG PO TABS: 10 MG | ORAL | Qty: 1

## 2013-07-12 MED FILL — POTASSIUM CHLORIDE 10 MEQ/100ML IV SOLN: 10 MEQ/0ML | INTRAVENOUS | Qty: 100

## 2013-07-12 MED FILL — SODIUM CHLORIDE 0.9 % IV SOLN: 0.9 % | INTRAVENOUS | Qty: 1000

## 2013-07-12 MED FILL — NORMAL SALINE FLUSH 0.9 % IV SOLN: 0.9 % | INTRAVENOUS | Qty: 10

## 2013-07-12 MED FILL — LEXAPRO 10 MG PO TABS: 10 MG | ORAL | Qty: 1

## 2013-07-12 MED FILL — PROMETHAZINE HCL 25 MG/ML IJ SOLN: 25 MG/ML | INTRAMUSCULAR | Qty: 1

## 2013-07-12 MED FILL — MORPHINE SULFATE (PF) 4 MG/ML IV SOLN: 4 mg/mL | INTRAVENOUS | Qty: 1

## 2013-07-12 MED FILL — LOVENOX 30 MG/0.3ML SC SOLN: 30 MG/0.3ML | SUBCUTANEOUS | Qty: 0.3

## 2013-07-12 MED FILL — NORMAL SALINE FLUSH 0.9 % IV SOLN: 0.9 % | INTRAVENOUS | Qty: 40

## 2013-07-12 MED FILL — FUROSEMIDE 40 MG PO TABS: 40 MG | ORAL | Qty: 1

## 2013-07-12 MED FILL — POTASSIUM CHLORIDE 20 MEQ/50ML IV SOLN: 20 MEQ/50ML | INTRAVENOUS | Qty: 50

## 2013-07-12 MED FILL — ONDANSETRON 4 MG PO TBDP: 4 MG | ORAL | Qty: 1

## 2013-07-12 MED FILL — CLINIMIX E/DEXTROSE (5/20) 5 % IV SOLN: 5 % | INTRAVENOUS | Qty: 2000

## 2013-07-12 MED FILL — TRANSDERM-SCOP (1.5 MG) 1 MG/3DAYS TD PT72: 1 MG/3DAYS | TRANSDERMAL | Qty: 1

## 2013-07-12 NOTE — Progress Notes (Signed)
[place] [facility] PROGRESS NOTE    07/12/2013 1:06 PM        Name: NIDYA BOUYER .              Admitted: 07/05/2013  Primary Care Provider: Margart Sickles SLOUGH, MD (Tel: [phone])      Subjective:    Patient is resting comfortably in bed. "Did not sleep well last pm". Denies pain and dyspnea. Ambulating in room to BR when needed.      Reviewed interval ancillary notes    Current Medications    PN-Adult Premix 5/20 - Standard Electrolytes - Central Line Continuous TPN   potassium chloride 20 mEq/50 mL IVPB (Central Line) Q2H   morphine (PF) injection 4 mg Q3H PRN   sodium chloride flush 0.9 % injection 10 mL Q12H SCH   sodium chloride flush 0.9 % injection 10 mL PRN   montelukast (SINGULAIR) tablet 10 mg Daily   escitalopram (LEXAPRO) tablet 10 mg Nightly   PN-Adult Premix 5/20 - Standard Electrolytes - Central Line Continuous TPN   glucose (GLUTOSE) 40 % oral gel 15 g PRN   dextrose 50 % solution 12.5 g PRN   glucagon (rDNA) injection 1 mg PRN   dextrose 5 % solution PRN   insulin lispro (HUMALOG) injection pen 0-6 Units Q4H   albuterol (PROVENTIL HFA;VENTOLIN HFA) 108 (90 BASE) MCG/ACT inhaler 2 puff Q4H PRN   0.9 % sodium chloride infusion Continuous   sodium chloride (PF) 0.9 % injection 20 mL PRN   piperacillin-tazobactam (ZOSYN) IVPB 3.375 g Q6H   scopolamine (TRANSDERM-SCOP) 1.5 MG 1 patch Q72H   ondansetron (ZOFRAN-ODT) disintegrating tablet 4 mg Q6H PRN   furosemide (LASIX) tablet 40 mg Daily   morphine (PF) injection 2 mg Q2H PRN   oxyCODONE-acetaminophen (ROXICET) 5-325 MG/5ML solution 10 mL Q4H PRN   oxyCODONE-acetaminophen (ROXICET) 5-325 MG/5ML solution 5 mL Q4H PRN   acetaminophen (TYLENOL) tablet 650 mg Q4H PRN   naloxone (NARCAN) injection 0.4 mg PRN   enoxaparin (LOVENOX) injection 30 mg Q12H SCH   diphenhydrAMINE (BENADRYL) injection 25 mg Q6H PRN   promethazine (PHENERGAN) injection 12.5 mg Q6H PRN       Objective:  BP 113/73   Pulse 67   Temp(Src) 98.3 ??F (36.8 ??C) (Oral)    Resp 18   Ht 5\' 6"  (1.676 m)   Wt 236 lb (107.049 kg)   BMI 38.11 kg/m2   SpO2 94%    Intake/Output Summary (Last 24 hours) at 07/12/13 1306  Last data filed at 07/12/13 2956   Gross per 24 hour   Intake   2746 ml   Output    850 ml   Net   1896 ml    Wt Readings from Last 3 Encounters:   07/09/13 236 lb (107.049 kg)   06/29/13 231 lb (104.781 kg)   06/12/13 225 lb (102.059 kg)       General appearance:  Appears comfortable  Eyes: Sclera clear. Pupils equal.  ENT: Moist oral mucosa. Trachea midline, no adenopathy.  Cardiovascular: Regular rhythm, normal S1, S2. No murmur. No edema in lower extremities  Respiratory: Not using accessory muscles. Good inspiratory effort. Clear to auscultation bilaterally, no wheeze or crackles.   GI: Abdomen soft, moderate tenderness around drainage tube, not distended, normal bowel sounds. Incision sites dry without erythema  Musculoskeletal: No cyanosis in digits, neck supple  Neurology: CN 2-12 grossly intact. No speech or motor deficits  Psych: Normal affect. Alert and oriented in  time, place and person  Skin: Warm, dry, normal turgor    Labs and Tests:  CBC: No results found for this basename: WBC, HGB, PLT,  in the last 72 hours  BMP:  Recent Labs      07/12/13   1030   NA  135*   K  2.6*   CL  100   CO2  30   BUN  6*   CREATININE  0.6   GLUCOSE  143*     Hepatic: Recent Labs      07/12/13   1030   AST  30   ALT  38   BILITOT  0.6   ALKPHOS  104       Discussed care with patient  Discussed plan with Dr. Freddie Apley    Problem List  Principal Problem:    Complication of gastric banding  Active Problems:    Morbid obesity       Assessment & Plan:   1. Complications of Gastric Banding s/p Band removal: Generally improving on TPN. Continue Zosyn Day #3  2. Hypokalemia: Will replace K+ and monitor electrolytes    Diet: Diet NPO, After Midnight, Except For  PN-Adult Premix 5/20 - Standard Electrolytes - Central Line  PN-Adult Premix 5/20 - Standard Electrolytes - Central Line  Code:Full  Code    Prudencio Burly, NP   07/12/2013 1:06 PM

## 2013-07-12 NOTE — Progress Notes (Signed)
Lab draw sent to lab, labeled and nurse patient verified in room.   Ander Slade

## 2013-07-12 NOTE — Progress Notes (Signed)
Assessment complete. VSS. Lung sounds clear with unlabored breathing, abdomen is soft with lap sites that are CD&I, JP is patent and draining serosanguinous fluid. Patient states her pain level is 9/10, pain medication given, see MAR. POC discussed with patient, pt verbalizes understanding. Call light within reach, will continue to monitor.  Rachael Mays

## 2013-07-12 NOTE — Progress Notes (Signed)
Blood draw sent to lab, nurse verify with patient and arm band in room.  Ander Slade

## 2013-07-12 NOTE — Progress Notes (Signed)
Pt resting in bed, with eyes closed. No s/s of distress noted. Pain medication given per pt request. VSS. Bed locked and in low position. Call light and bedside table within reach. Will continue to monitor and handoff to oncoming shift. Valetta Fuller

## 2013-07-12 NOTE — Progress Notes (Signed)
Discharge Planning:    Left message for Rachael Mays at [facility] regarding need for TPN at home.  Rachael Mays to investigate pt's insurance benefits for home TPN. [name]

## 2013-07-12 NOTE — Progress Notes (Signed)
Shift assessment completed. No s/s of distress noted. VSS. No complaints of pain at this time. Pt ambulating in the hallway with her husband at this time, tolerates very well. Bed locked and in low position. Side rails up x 2. Bedside table and call light within reach. Pt states no further needs at this time. Will continue to monitor. Valetta Fuller

## 2013-07-12 NOTE — Progress Notes (Signed)
Pain medication given per pt request, see MAR. Guido Comp L Melis Trochez

## 2013-07-12 NOTE — Progress Notes (Addendum)
Pt resting in bed. No distress noted. No complaints of pain at this time. Bed locked and in low position. Side rails up x2. Call light and bedside table within reach.Zaheer Wageman L Antwine Agosto

## 2013-07-12 NOTE — Progress Notes (Signed)
Spoke with Thurston Hole NP with Dr. Suzi Roots and no new orders on patient in room 4472 regarding headache. Notified Thurston Hole of panic Potassium level on patient in room 4473 Rachael Mays. Thurston Hole stated she will notify Dr. Suzi Roots of potassium level and call back.  Rachael Mays

## 2013-07-12 NOTE — Progress Notes (Signed)
Feeling better.  No n/v.  +flatus.  Feels hungry and wants to eat.  abmulating well.  Filed Vitals:    07/12/13 1139   BP: 113/73   Pulse: 67   Temp: 98.3 ??F (36.8 ??C)   Resp: 18     Abd: soft minimal incision tenderness/nd  JP with minimal serous output.  No bile or purulent fluid      AP:  S/p band removal  ? Of small gastric leak  On TPN and NPO  Hypokalemic  Will recheck and adjust TPN per pharmacy    Cont ambualation/ IS use  Home when stable on TPN

## 2013-07-12 NOTE — Progress Notes (Signed)
NUTRITION THERAPY ASSESSMENT     Admission date:07/05/2013      Timepoint: f/u assessment (consult for PN)    Rachael Michaelisamela S Mays is a 57 y.o. female who is admitted to have lap band removed d/t erosion.  Pt has been NPO x 7 days.  PN has been started @ 40 ml/hr.    OBJECTIVE DATA  Patient Active Problem List    Diagnosis Date Noted   ??? Complication of gastric banding 07/05/2013   ??? 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 Vertigo 04/11/2013   ??? Dizziness 03/29/2013   ??? 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 Morbid obesity 02/21/2009   ??? Back Pain 01/23/2009   ??? Deep Vein Thrombosis 01/23/2009   ??? Depression 01/23/2009   ??? High Cholesterol 01/23/2009   ??? Hypertension 01/23/2009   ??? Pulmonary Embolism 01/23/2009   ??? Abdominal  Pain, Other Specified Site 01/23/2009     Past Medical History   Diagnosis Date   ??? Anxiety    ??? Asthma    ??? Chronic back pain    ??? Deep vein thrombosis    ??? Depression    ??? GERD (gastroesophageal reflux disease)      NO LONGER SINCE LAP   ??? Pulmonary embolism (HCC)    ??? Obesity      hx of; had lap band   ??? Hyperlipidemia      hx; resolved with lap band   ??? GERD (gastroesophageal reflux disease) 01/23/2009   ??? Hypertension        Labs  Recent Labs      07/12/13   1030   NA  135*   K  2.6*   CL  100   CO2  30   BUN  6*   CREATININE  0.6   GLUCOSE  143*     Lab Results   Component Value Date    PHOS 2.5 07/12/2013       Recent Labs      07/12/13   1030   AST  30   ALT  38   BILITOT  0.6   ALKPHOS  104     Lab Results   Component Value Date    LABALBU 2.9 07/12/2013      Lab Results   Component Value Date    TRIG 95 06/30/2013    HDL 60 06/30/2013    HDL 52 03/31/2011    LDLCALC 138 06/30/2013    LABVLDL 19 06/30/2013     No results found for this basename: LIPASE,  in the last 72 hours  Lab Results   Component Value Date    LABA1C 5.2 03/26/2010       Medications  Continuous Medications:    ??? PN-Adult Premix 5/20 - Standard Electrolytes - Central Line     ??? PN-Adult Premix 5/20 -  Standard Electrolytes - Central Line 40 mL/hr at 07/11/13 2036   ??? dextrose     ??? sodium chloride 75 mL/hr at 07/12/13 1134     Scheduled Medications:   ??? potassium chloride  20 mEq Intravenous Q2H   ??? sodium chloride flush  10 mL Intravenous Q12H SCH   ??? montelukast  10 mg Oral Daily   ??? escitalopram  10 mg Oral Nightly   ??? insulin lispro  0-6 Units Subcutaneous Q4H   ??? piperacillin-tazobactam  3.375 g Intravenous Q6H   ??? scopolamine  1 patch Transdermal Q72H   ???  furosemide  40 mg Oral Daily   ??? enoxaparin  30 mg Subcutaneous Q12H [facility]       Anthropometric Measures   Height:  (167.6 cm)   Current Weight: Weight: 236 lb (107.049 kg)   Admission weight: 233 lb 4 oz (105.8 kg)  Weight Method: Actual  If applicable: n/a  Usual Body Weight:  lb       Ideal Body Weight:  lb      % Ideal Body Weight:  %  Adjusted Body Weight:  lb    Body mass index is 38.11 kg/(m^2).   Classified as:  Less than 18.5 Underweight  18.5-24.9 Normal Weight  25-29.9 Overweight  30-34.9 Obese Class I  35-39.9 Obese Class II  Greater than or equal to 40 Obese Class III    Patient Vitals for the past 96 hrs (Last 3 readings):   Weight   07/09/13 0300 236 lb (107.049 kg)     Wt Readings from Last 50 Encounters:   07/09/13 236 lb (107.049 kg)   06/29/13 231 lb (104.781 kg)   06/12/13 225 lb (102.059 kg)   06/07/13 228 lb 8 oz (103.647 kg)   05/30/13 229 lb (103.874 kg)   05/22/13 230 lb 8 oz (104.554 kg)   05/19/13 231 lb 11.3 oz (105.1 kg)   05/18/13 225 lb (102.059 kg)   05/18/13 232 lb (105.235 kg)   04/27/13 229 lb 3.2 oz (103.964 kg)   04/17/13 230 lb (104.327 kg)   04/03/13 231 lb (104.781 kg)   03/29/13 228 lb 8 oz (103.647 kg)   03/10/13 227 lb 8 oz (103.193 kg)   02/23/13 227 lb 9.6 oz (103.239 kg)   12/28/12 217 lb (98.431 kg)   12/15/12 216 lb (97.977 kg)   10/20/12 213 lb (96.616 kg)   06/24/12 212 lb (96.163 kg)   06/02/12 210 lb (95.255 kg)   05/31/12 208 lb 3.2 oz (94.439 kg)   05/19/12 209 lb (94.802 kg)   04/14/12 211 lb  (95.709 kg)   03/17/12 213 lb (96.616 kg)   03/16/12 214 lb (97.07 kg)   12/23/11 223 lb (101.152 kg)   11/14/11 220 lb (99.791 kg)   11/07/11 220 lb (99.791 kg)   11/03/11 226 lb (102.513 kg)   05/15/11 233 lb 12.8 oz (106.051 kg)   04/08/10 240 lb (108.863 kg)   04/01/10 240 lb (108.863 kg)   03/26/10 241 lb 2 oz (109.374 kg)   02/06/10 248 lb 3 oz (112.577 kg)   11/07/09 243 lb (110.224 kg)   10/23/09 235 lb (106.595 kg)   08/07/09 246 lb (111.585 kg)   05/02/09 242 lb 5 oz (109.912 kg)   02/21/09 245 lb (111.131 kg)   01/23/09 244 lb 4 oz (110.791 kg)   11/19/08 243 lb 9 oz (110.479 kg)       Comparative Standards  71 kg  Weight Used: adj wt  Estimated Calorie Needs: 1775-2130 (based on 25- 30kcal/kg)  Estimated Protein Needs: 85-106 (based on 1.2- 1.5gm/kg)  Estimated Fluid Needs: 1 mL/kcal    Nutrition-focused Physical Findings           Emesis:    none reported  Stool:    Stool Appearance: Loose    Skin/Edema:  Skin Integrity (WDL): Exceptions to WDL   Location: abdomen Skin Integrity: Other (Comment) (lap sites to abdomen)     Chewing / Swallowing:   no issues reported  Mental Status / Barriers:  no issues  reported    Food/Nutrition-Related History  Pre-Admission / Home Diet:  Pre-Admission/Home Diet: General   Home Supplements / Herbals:    none noted  Food Restrictions / Cultural Requests:    none noted  Allergies as of 07/05/2013 - Review Complete 07/05/2013   Allergen Reaction Noted   ??? Nystatin Other (See Comments) 03/29/2013   ??? Talwin [pentazocine] Other (See Comments) 03/29/2013       PO Diet Orders  Current diet order: Diet NPO, After Midnight, Except For  PN-Adult Premix 5/20 - Standard Electrolytes - Central Line  PN-Adult Premix 5/20 - Standard Electrolytes - Central Line   Current supplement order:  none  Feeding Assistance:        Feeding: Able to feed self    Room Service: selective  Nursing Recorded PO Intake:   n/a  Patient / Family Reported PO Intake: n/a  Observed PO Intake: n/a  Pain does  not affect PO intake.    Tube Feeding Order / Parenteral Order  PN @ 40 ml/hr      Intake/Output Summary (Last 24 hours) at 07/12/13 1217  Last data filed at 07/12/13 0981   Gross per 24 hour   Intake   2746 ml   Output    850 ml   Net   1896 ml       Intake vs. Needs: inadequate d/t NPO with PN    NUTRITION DIAGNOSIS and GOAL  Nutrition Deficit Risk.   ?? Problem: inadequate intake from PN  ?? Etiology/related to: PN not yet to goal  ?? Symptoms/Signs/as evidenced by: PN meeting less than 100% of needs     Goal: PN will provide adequate nutrition until pt can tolerate po intake  ?? Initiated: 8/6       ?? Progress: N/A       ?? Resolved: N/A    EDUCATION  NA      MALNUTRITION  Per AND/ASPEN guidelines, patient does not meet criteria for malnutrition.    INTERVENTION HISTORY  8/6 initial assessment      NUTRITION RECOMMENDATIONS / PRESCRIPTION / MONITORING / EVALUATION  1. rec'd goal rate for PN @ 83 ml/hr to provide 1992 ml; 1753 kcal, 100 g pro & 2.6 dex load  2. Diet advancement as tolerated (rec'd GI soft)    Pt is at high nutritional risk. Will follow up in 2-3 days to monitor PN tolerance vs ability to advance diet.    Rachael Mays RD, LD  Pager: [phone]

## 2013-07-12 NOTE — Plan of Care (Signed)
Problem: SAFETY  Goal: Free from accidental physical injury  No injury noted. Precautions in place.    Problem: PAIN  Goal: Patient???s pain/discomfort is manageable  Patient is able to rate pain on 0-10 pain scale. Pain level tolerable with PRN pain medications. Patient aware of medications, side effects and PRN schedule. Will continue to assess.        Problem: SKIN INTEGRITY  Goal: Skin integrity is maintained or improved  Surgical site to abdomen, dressing clean;dry;intact.    Problem: Nausea/Vomiting  Goal: Absence of nausea/vomiting  No complaints of either.    Problem: Bowel Function - Altered  Goal: Bowel function within specified parameters  Passing some gas, no BM, hypoactive bowel sounds.    Problem: Falls - Risk of  Goal: Absence of falls  No falls noted. Precautions in place.

## 2013-07-13 LAB — POCT GLUCOSE
POC Glucose: 123 mg/dl — ABNORMAL HIGH (ref 70–99)
POC Glucose: 134 mg/dl — ABNORMAL HIGH (ref 70–99)
POC Glucose: 115 mg/dl — ABNORMAL HIGH (ref 70–99)
POC Glucose: 101 mg/dl — ABNORMAL HIGH (ref 70–99)
POC Glucose: 115 mg/dl — ABNORMAL HIGH (ref 70–99)
POC Glucose: 107 mg/dl — ABNORMAL HIGH (ref 70–99)

## 2013-07-13 LAB — COMPREHENSIVE METABOLIC PANEL
ALT: 32 U/L (ref 10–40)
AST: 21 U/L (ref 15–37)
Albumin/Globulin Ratio: 1.1 (ref 1.1–2.2)
Albumin: 2.9 g/dL — ABNORMAL LOW (ref 3.4–5.0)
Alkaline Phosphatase: 96 U/L (ref 40–129)
BUN: 7 mg/dL (ref 7–20)
CO2: 30 mmol/L (ref 21–32)
Calcium: 8.4 mg/dL (ref 8.3–10.6)
Chloride: 105 mmol/L (ref 99–110)
Creatinine: 0.5 mg/dL — ABNORMAL LOW (ref 0.6–1.1)
GFR African American: >60 (ref >60)
GFR Non-African American: >60 (ref >60)
Globulin: 2.7 g/dL
Glucose: 139 mg/dL — ABNORMAL HIGH (ref 70–99)
Potassium: 2.9 mmol/L — CL (ref 3.5–5.1)
Sodium: 141 mmol/L (ref 136–145)
Total Bilirubin: 0.5 mg/dL (ref 0.0–1.0)
Total Protein: 5.6 g/dL — ABNORMAL LOW (ref 6.4–8.2)

## 2013-07-13 MED ADMIN — nystatin (MYCOSTATIN) powder: TOPICAL | @ 03:00:00 | NDC 00574200815

## 2013-07-13 MED ADMIN — enoxaparin (LOVENOX) injection 30 mg: SUBCUTANEOUS | @ 13:00:00 | NDC 00075801301

## 2013-07-13 MED ADMIN — nystatin (MYCOSTATIN) powder: TOPICAL | @ 14:00:00 | NDC 00574200815

## 2013-07-13 MED ADMIN — piperacillin-tazobactam (ZOSYN) IVPB 3.375 g: INTRAVENOUS | @ 02:00:00 | NDC 00206886101

## 2013-07-13 MED ADMIN — furosemide (LASIX) tablet 40 mg: ORAL | @ 13:00:00 | NDC 00904579761

## 2013-07-13 MED ADMIN — piperacillin-tazobactam (ZOSYN) IVPB 3.375 g: INTRAVENOUS | @ 17:00:00 | NDC 00206886101

## 2013-07-13 MED ADMIN — piperacillin-tazobactam (ZOSYN) IVPB 3.375 g: INTRAVENOUS | @ 10:00:00 | NDC 00206886101

## 2013-07-13 MED ADMIN — potassium chloride 10 mEq/100 mL IVPB (Peripheral Line): INTRAVENOUS | @ 13:00:00 | NDC 00338070948

## 2013-07-13 MED ADMIN — sodium chloride flush 0.9 % injection 10 mL: INTRAVENOUS | @ 01:00:00

## 2013-07-13 MED ADMIN — potassium chloride (KAYCIEL) 20 MEQ/15ML (10%) solution 40 mEq: ORAL | @ 14:00:00 | NDC 00121146530

## 2013-07-13 MED ADMIN — piperacillin-tazobactam (ZOSYN) IVPB 3.375 g: INTRAVENOUS | @ 22:00:00 | NDC 00206886101

## 2013-07-13 MED ADMIN — montelukast (SINGULAIR) tablet 10 mg: ORAL | @ 13:00:00 | NDC 68084062011

## 2013-07-13 MED ADMIN — potassium chloride 10 mEq/100 mL IVPB (Peripheral Line): INTRAVENOUS | @ 20:00:00 | NDC 00338070948

## 2013-07-13 MED ADMIN — potassium chloride 10 mEq/100 mL IVPB (Peripheral Line): INTRAVENOUS | @ 11:00:00 | NDC 00338070948

## 2013-07-13 MED ADMIN — potassium chloride 20 mEq/50 mL IVPB (Central Line): INTRAVENOUS | @ 02:00:00 | NDC 00338070341

## 2013-07-13 MED ADMIN — potassium chloride 10 mEq/100 mL IVPB (Peripheral Line): INTRAVENOUS | @ 21:00:00 | NDC 00338070948

## 2013-07-13 MED ADMIN — 0.9 % sodium chloride infusion: INTRAVENOUS | @ 10:00:00 | NDC 00338004904

## 2013-07-13 MED ADMIN — enoxaparin (LOVENOX) injection 30 mg: SUBCUTANEOUS | @ 01:00:00 | NDC 00075801301

## 2013-07-13 MED ADMIN — escitalopram (LEXAPRO) tablet 10 mg: ORAL | @ 01:00:00 | NDC 68084061711

## 2013-07-13 MED ADMIN — PN-Adult Premix 5/20 - Standard Electrolytes - Central Line: INTRAVENOUS | @ 21:00:00 | NDC 00409665305

## 2013-07-13 MED ADMIN — potassium chloride 10 mEq/100 mL IVPB (Peripheral Line): INTRAVENOUS | @ 17:00:00 | NDC 00338070948

## 2013-07-13 MED ADMIN — piperacillin-tazobactam (ZOSYN) IVPB 3.375 g: INTRAVENOUS | @ 05:00:00 | NDC 00206886101

## 2013-07-13 MED ADMIN — potassium chloride 10 mEq/100 mL IVPB (Peripheral Line): INTRAVENOUS | @ 15:00:00 | NDC 00338070948

## 2013-07-13 MED ADMIN — sodium chloride flush 0.9 % injection 10 mL: INTRAVENOUS | @ 13:00:00

## 2013-07-13 MED ADMIN — morphine (PF) injection 4 mg: 4 mg | INTRAVENOUS | @ 02:00:00 | NDC 00409189101

## 2013-07-13 MED ADMIN — morphine (PF) injection 4 mg: 4 mg | INTRAVENOUS | @ 07:00:00 | NDC 00409189101

## 2013-07-13 MED ADMIN — morphine (PF) injection 4 mg: 4 mg | INTRAVENOUS | @ 17:00:00 | NDC 00409189101

## 2013-07-13 MED ADMIN — morphine (PF) injection 4 mg: 4 mg | INTRAVENOUS | @ 11:00:00 | NDC 00409189101

## 2013-07-13 MED ADMIN — morphine (PF) injection 4 mg: 4 mg | INTRAVENOUS | @ 21:00:00 | NDC 00409189101

## 2013-07-13 MED FILL — MORPHINE SULFATE (PF) 4 MG/ML IV SOLN: 4 mg/mL | INTRAVENOUS | Qty: 1

## 2013-07-13 MED FILL — ZOSYN 3-0.375 GM/50ML IV SOLN: INTRAVENOUS | Qty: 50

## 2013-07-13 MED FILL — LEXAPRO 10 MG PO TABS: 10 MG | ORAL | Qty: 1

## 2013-07-13 MED FILL — POTASSIUM CHLORIDE 10 MEQ/100ML IV SOLN: 10 MEQ/0ML | INTRAVENOUS | Qty: 100

## 2013-07-13 MED FILL — NORMAL SALINE FLUSH 0.9 % IV SOLN: 0.9 % | INTRAVENOUS | Qty: 10

## 2013-07-13 MED FILL — LOVENOX 30 MG/0.3ML SC SOLN: 30 MG/0.3ML | SUBCUTANEOUS | Qty: 0.3

## 2013-07-13 MED FILL — SINGULAIR 10 MG PO TABS: 10 MG | ORAL | Qty: 1

## 2013-07-13 MED FILL — SODIUM CHLORIDE 0.9 % IV SOLN: 0.9 % | INTRAVENOUS | Qty: 1000

## 2013-07-13 MED FILL — NORMAL SALINE FLUSH 0.9 % IV SOLN: 0.9 % | INTRAVENOUS | Qty: 40

## 2013-07-13 MED FILL — MYCOSTATIN 100000 UNIT/GM EX POWD: 100000 UNIT/GM | CUTANEOUS | Qty: 15

## 2013-07-13 MED FILL — POTASSIUM CHLORIDE 20 MEQ/15ML (10%) PO SOLN: 20 MEQ/15ML (10%) | ORAL | Qty: 30

## 2013-07-13 MED FILL — FUROSEMIDE 40 MG PO TABS: 40 MG | ORAL | Qty: 1

## 2013-07-13 MED FILL — POTASSIUM CHLORIDE 20 MEQ/50ML IV SOLN: 20 MEQ/50ML | INTRAVENOUS | Qty: 50

## 2013-07-13 MED FILL — CLINIMIX E/DEXTROSE (5/20) 5 % IV SOLN: 5 % | INTRAVENOUS | Qty: 2000

## 2013-07-13 NOTE — Plan of Care (Signed)
Problem: SAFETY  Goal: Free from accidental physical injury  No injury noted. Precautions in place.    Problem: PAIN  Goal: Patient???s pain/discomfort is manageable  Patient is able to rate pain on 0-10 pain scale. Pain level tolerable with PRN pain medications. Patient aware of medications, side effects and PRN schedule. Will continue to assess.        Problem: SKIN INTEGRITY  Goal: Skin integrity is maintained or improved  Lap sites WDL, Drain site WDL    Problem: Nausea/Vomiting  Goal: Absence of nausea/vomiting  No complaints of nausea or vomiting.    Problem: Bowel Function - Altered  Goal: Bowel function within specified parameters  Bowel sounds audible, pt is passing gas.    Problem: Falls - Risk of  Goal: Absence of falls  No falls noted. Precautions in place.

## 2013-07-13 NOTE — Progress Notes (Signed)
Pt seem and examined  S/p lap band removal  On TPN  No new complaints  Abdomen is soft, minimally tender  Vitals stable    Continue TPN  Ambulate

## 2013-07-13 NOTE — Progress Notes (Signed)
Pt resting in bed, with eyes closed. No s/s of distress noted. No S/S of pain  Noted at this time. VSS. Bed locked and in low position. Call light and bedside table within reach. Will continue to monitor and handoff to oncoming shift. Rachael Mays L Rachael Mays

## 2013-07-13 NOTE — Progress Notes (Signed)
[facility] PROGRESS NOTE    07/13/2013 9:05 AM        Name: HADEN CAVENAUGH .              Admitted: 07/05/2013  Primary Care Provider: Margart Sickles SLOUGH, MD (Tel: [phone])      Subjective:      Hospitalists are following for medical management  She is S/P  band removal due to possible leak    She has been up walking in the halls  Remains NPO on TPN  She is hungry and wants to know she will be able to eat    Reviewed interval ancillary notes    Current Medications    potassium chloride SA (K-DUR;KLOR-CON M) tablet 40 mEq PRN   Or    potassium chloride (KAYCIEL) 20 MEQ/15ML (10%) solution 40 mEq PRN   Or    potassium chloride 10 mEq/100 mL IVPB (Peripheral Line) PRN   potassium chloride (KAYCIEL) 20 MEQ/15ML (10%) solution 40 mEq Once   PN-Adult Premix 5/20 - Standard Electrolytes - Central Line Continuous TPN   PN-Adult Premix 5/20 - Standard Electrolytes - Central Line Continuous TPN   nystatin (MYCOSTATIN) powder BID   morphine (PF) injection 4 mg Q3H PRN   sodium chloride flush 0.9 % injection 10 mL Q12H SCH   sodium chloride flush 0.9 % injection 10 mL PRN   montelukast (SINGULAIR) tablet 10 mg Daily   escitalopram (LEXAPRO) tablet 10 mg Nightly   glucose (GLUTOSE) 40 % oral gel 15 g PRN   dextrose 50 % solution 12.5 g PRN   glucagon (rDNA) injection 1 mg PRN   dextrose 5 % solution PRN   insulin lispro (HUMALOG) injection pen 0-6 Units Q4H   albuterol (PROVENTIL HFA;VENTOLIN HFA) 108 (90 BASE) MCG/ACT inhaler 2 puff Q4H PRN   0.9 % sodium chloride infusion Continuous   sodium chloride (PF) 0.9 % injection 20 mL PRN   piperacillin-tazobactam (ZOSYN) IVPB 3.375 g Q6H   scopolamine (TRANSDERM-SCOP) 1.5 MG 1 patch Q72H   ondansetron (ZOFRAN-ODT) disintegrating tablet 4 mg Q6H PRN   furosemide (LASIX) tablet 40 mg Daily   morphine (PF) injection 2 mg Q2H PRN   oxyCODONE-acetaminophen (ROXICET) 5-325 MG/5ML solution 10 mL Q4H PRN   oxyCODONE-acetaminophen (ROXICET) 5-325 MG/5ML solution 5  mL Q4H PRN   acetaminophen (TYLENOL) tablet 650 mg Q4H PRN   naloxone (NARCAN) injection 0.4 mg PRN   enoxaparin (LOVENOX) injection 30 mg Q12H SCH   diphenhydrAMINE (BENADRYL) injection 25 mg Q6H PRN   promethazine (PHENERGAN) injection 12.5 mg Q6H PRN       Objective:  BP 133/75   Pulse 67   Temp(Src) 98.4 ??F (36.9 ??C) (Oral)   Resp 18   Ht 5\' 6"  (1.676 m)   Wt 236 lb (107.049 kg)   BMI 38.11 kg/m2   SpO2 97%    Intake/Output Summary (Last 24 hours) at 07/13/13 0905  Last data filed at 07/13/13 0740   Gross per 24 hour   Intake   2851 ml   Output    930 ml   Net   1921 ml    Wt Readings from Last 3 Encounters:   07/09/13 236 lb (107.049 kg)   06/29/13 231 lb (104.781 kg)   06/12/13 225 lb (102.059 kg)       General appearance:  Appears comfortable in bed. She is obese  Eyes: Sclera clear. Pupils equal.  ENT: Moist oral mucosa. Trachea midline, no adenopathy.  Cardiovascular: Regular rhythm, normal S1, S2. No murmur. No edema in lower extremities  Respiratory: Not using accessory muscles,  decreased in bases  GI: Abdomen soft with active bowel sounds, JP with serous drainage. Bruising noted at incision  Musculoskeletal: No cyanosis in digits, neck supple  Neurology: CN 2-12 grossly intact. No speech or motor deficits  Psych: Normal affect. Alert and oriented in time, place and person  Skin: Warm, dry, normal turgor    Labs and Tests:  CBC: No results found for this basename: WBC, HGB, PLT,  in the last 72 hours  BMP:  Recent Labs      07/12/13   1030  07/12/13   1530  07/13/13   0610   NA  135*  136  141   K  2.6*  3.1*  2.9*   CL  100  99  105   CO2  30  31  30    BUN  6*  6*  7   CREATININE  0.6  0.5*  0.5*   GLUCOSE  143*  135*  139*     Hepatic: Recent Labs      07/12/13   1030  07/13/13   0610   AST  30  21   ALT  38  32   BILITOT  0.6  0.5   ALKPHOS  104  96     Results for KATRINIA, STRAKER (MRN [id number]) as of 07/13/2013 09:08   Ref. Range 07/12/2013 16:21 07/12/2013 21:02 07/13/2013 00:00 07/13/2013 03:59 07/13/2013  08:03   POC Glucose Latest Range: 70-99 mg/dl 098 (H) 119 (H) 147 (H) 115 (H) 101 (H)     CT chest from 8/3    IMPRESSION:    Small bilateral pleural effusions and basilar opacities most  consistent with atelectasis. Pneumonia is less likely.    Focus of airspace disease in the peripheral right upper lobe. This  may represent pneumonia or aspiration pneumonitis. Followup CT  chest is recommended in 3 months.      Problem List  Principal Problem:    Complication of gastric banding  Active Problems:    Morbid obesity       Assessment & Plan:   1. S/P band removal:  Care per surgery and GI.  She has been started on TPN  2. Hypokalemia:  On replacement therapy,  RX to consider adding more K to TPN  3. Possible PNA vx atelectasis: currently on zosyn.  CT suggests follow up CT scan in 3 months.  Use of IS and ambulation was encouraged.    Diet: Diet NPO, After Midnight, Except For  PN-Adult Premix 5/20 - Standard Electrolytes - Central Line  PN-Adult Premix 5/20 - Standard Electrolytes - Central Line  Code:Full Code    Eluzer Howdeshell, NP   07/13/2013 9:05 AM

## 2013-07-13 NOTE — Plan of Care (Signed)
Problem: KNOWLEDGE DEFICIT  Goal: Patient/S.O. demonstrates understanding of disease process, treatment plan, medications, and discharge instructions.  Pt is educated about potassium protocol.

## 2013-07-13 NOTE — Progress Notes (Signed)
[facility] Received home care referral. Requested to check home infusion/TPN benefits. Referral to [employer] [phone] to run benefits per payer. Pt has coverage: deduct met, 80% coverage til 3500. max oop (2890. Met) then 100% coverage. Discharge planner notified.

## 2013-07-13 NOTE — Progress Notes (Signed)
Shift assessment completed. No s/s of distress noted. VSS. No complaints of pain at this time. Complaining of some itching will let the MD know. Bed locked and in low position. Side rails up x 2. Bedside table and call light within reach. Pt states no further needs at this time. Visitor at bedside. Will continue to monitor. Valetta Fuller

## 2013-07-13 NOTE — Progress Notes (Signed)
Am assessment done, pt is alert and oriented, incision dressing clean, dry, intact, pt is on IV K replacement and one time dose of oral K this am, Pain at a tolerable level. Rachael Mays

## 2013-07-14 LAB — COMPREHENSIVE METABOLIC PANEL
ALT: 32 U/L (ref 10–40)
AST: 23 U/L (ref 15–37)
Albumin/Globulin Ratio: 0.9 — ABNORMAL LOW (ref 1.1–2.2)
Albumin: 3 g/dL — ABNORMAL LOW (ref 3.4–5.0)
Alkaline Phosphatase: 92 U/L (ref 40–129)
BUN: 9 mg/dL (ref 7–20)
CO2: 27 mmol/L (ref 21–32)
Calcium: 8.5 mg/dL (ref 8.3–10.6)
Chloride: 102 mmol/L (ref 99–110)
Creatinine: 0.6 mg/dL (ref 0.6–1.1)
GFR African American: >60 (ref >60)
GFR Non-African American: >60 (ref >60)
Globulin: 3.3 g/dL
Glucose: 132 mg/dL — ABNORMAL HIGH (ref 70–99)
Potassium: 3.3 mmol/L — ABNORMAL LOW (ref 3.5–5.1)
Sodium: 138 mmol/L (ref 136–145)
Total Bilirubin: 0.4 mg/dL (ref 0.0–1.0)
Total Protein: 6.3 g/dL — ABNORMAL LOW (ref 6.4–8.2)

## 2013-07-14 LAB — POCT GLUCOSE
POC Glucose: 118 mg/dl — ABNORMAL HIGH (ref 70–99)
POC Glucose: 120 mg/dl — ABNORMAL HIGH (ref 70–99)
POC Glucose: 121 mg/dl — ABNORMAL HIGH (ref 70–99)
POC Glucose: 105 mg/dl — ABNORMAL HIGH (ref 70–99)
POC Glucose: 105 mg/dl — ABNORMAL HIGH (ref 70–99)
POC Glucose: 120 mg/dl — ABNORMAL HIGH (ref 70–99)

## 2013-07-14 MED ORDER — ONDANSETRON HCL 4 MG PO TABS
4 MG | ORAL_TABLET | Freq: Three times a day (TID) | ORAL | Status: DC | PRN
Start: 2013-07-14 — End: 2013-09-14

## 2013-07-14 MED ORDER — AMOXICILLIN-POT CLAVULANATE 875-125 MG PO TABS
875-125 MG | ORAL_TABLET | Freq: Two times a day (BID) | ORAL | Status: AC
Start: 2013-07-14 — End: 2013-07-24

## 2013-07-14 MED ORDER — POTASSIUM CHLORIDE 20 MEQ/15ML (10%) PO LIQD
20 MEQ/15ML (10%) | Freq: Two times a day (BID) | ORAL | Status: DC
Start: 2013-07-14 — End: 2013-07-30

## 2013-07-14 MED ORDER — OXYCODONE-ACETAMINOPHEN 5-325 MG PO TABS
5-325 MG | ORAL_TABLET | ORAL | Status: AC | PRN
Start: 2013-07-14 — End: 2013-07-21

## 2013-07-14 MED ORDER — ENOXAPARIN SODIUM 30 MG/0.3ML SC SOLN
30 MG/0.3ML | Freq: Every day | SUBCUTANEOUS | Status: DC
Start: 2013-07-14 — End: 2013-08-20

## 2013-07-14 MED ADMIN — 0.9 % sodium chloride infusion: INTRAVENOUS | @ 09:00:00 | NDC 00338004904

## 2013-07-14 MED ADMIN — escitalopram (LEXAPRO) tablet 10 mg: ORAL | @ 01:00:00 | NDC 68084061711

## 2013-07-14 MED ADMIN — diphenhydrAMINE (BENADRYL) injection 25 mg: INTRAVENOUS | @ 01:00:00 | NDC 00641037621

## 2013-07-14 MED ADMIN — nystatin (MYCOSTATIN) powder: TOPICAL | NDC 00574200815

## 2013-07-14 MED ADMIN — sodium chloride flush 0.9 % injection 10 mL: INTRAVENOUS | @ 12:00:00

## 2013-07-14 MED ADMIN — nystatin (MYCOSTATIN) powder: TOPICAL | @ 12:00:00 | NDC 00574200815

## 2013-07-14 MED ADMIN — enoxaparin (LOVENOX) injection 30 mg: SUBCUTANEOUS | @ 12:00:00 | NDC 00075801301

## 2013-07-14 MED ADMIN — furosemide (LASIX) tablet 40 mg: ORAL | @ 12:00:00 | NDC 00904579761

## 2013-07-14 MED ADMIN — piperacillin-tazobactam (ZOSYN) IVPB 3.375 g: INTRAVENOUS | @ 23:00:00 | NDC 00206886101

## 2013-07-14 MED ADMIN — piperacillin-tazobactam (ZOSYN) IVPB 3.375 g: INTRAVENOUS | @ 16:00:00 | NDC 00206886101

## 2013-07-14 MED ADMIN — piperacillin-tazobactam (ZOSYN) IVPB 3.375 g: INTRAVENOUS | @ 04:00:00 | NDC 00206886101

## 2013-07-14 MED ADMIN — potassium chloride (KAYCIEL) 20 MEQ/15ML (10%) solution 40 mEq: ORAL | @ 14:00:00 | NDC 00121146530

## 2013-07-14 MED ADMIN — ondansetron (ZOFRAN-ODT) disintegrating tablet 4 mg: ORAL | @ 21:00:00 | NDC 68001024616

## 2013-07-14 MED ADMIN — potassium chloride (KAYCIEL) 20 MEQ/15ML (10%) solution 40 mEq: ORAL | @ 19:00:00 | NDC 00121146530

## 2013-07-14 MED ADMIN — montelukast (SINGULAIR) tablet 10 mg: ORAL | @ 12:00:00 | NDC 68084062011

## 2013-07-14 MED ADMIN — PN-Adult Premix 5/20 - Standard Electrolytes - Central Line: INTRAVENOUS | @ 22:00:00 | NDC 63323014397

## 2013-07-14 MED ADMIN — piperacillin-tazobactam (ZOSYN) IVPB 3.375 g: INTRAVENOUS | @ 10:00:00 | NDC 00206886101

## 2013-07-14 MED ADMIN — enoxaparin (LOVENOX) injection 30 mg: SUBCUTANEOUS | @ 01:00:00 | NDC 00075801301

## 2013-07-14 MED ADMIN — fluconazole (DIFLUCAN) 200 mg in 0.9 % NaCl 100 mL premix IVPB: INTRAVENOUS | NDC 00338604648

## 2013-07-14 MED ADMIN — magnesium sulfate 1 g in dextrose 5% 100 mL IVPB (premix): INTRAVENOUS | @ 19:00:00 | NDC 00409672723

## 2013-07-14 MED ADMIN — sodium chloride flush 0.9 % injection 10 mL: INTRAVENOUS | @ 01:00:00

## 2013-07-14 MED ADMIN — morphine (PF) injection 4 mg: 4 mg | INTRAVENOUS | @ 03:00:00 | NDC 00409189101

## 2013-07-14 MED ADMIN — morphine (PF) injection 4 mg: 4 mg | INTRAVENOUS | @ 06:00:00 | NDC 00409189101

## 2013-07-14 MED ADMIN — morphine (PF) injection 4 mg: 4 mg | INTRAVENOUS | @ 21:00:00 | NDC 00409189101

## 2013-07-14 MED FILL — MORPHINE SULFATE (PF) 4 MG/ML IV SOLN: 4 mg/mL | INTRAVENOUS | Qty: 1

## 2013-07-14 MED FILL — NORMAL SALINE FLUSH 0.9 % IV SOLN: 0.9 % | INTRAVENOUS | Qty: 10

## 2013-07-14 MED FILL — FLUCONAZOLE IN SODIUM CHLORIDE 200-0.9 MG/100ML-% IV SOLN: INTRAVENOUS | Qty: 100

## 2013-07-14 MED FILL — CLINIMIX E/DEXTROSE (5/20) 5 % IV SOLN: 5 % | INTRAVENOUS | Qty: 2000

## 2013-07-14 MED FILL — POTASSIUM CHLORIDE 20 MEQ/15ML (10%) PO SOLN: 20 MEQ/15ML (10%) | ORAL | Qty: 30

## 2013-07-14 MED FILL — ONDANSETRON 4 MG PO TBDP: 4 MG | ORAL | Qty: 1

## 2013-07-14 MED FILL — NORMAL SALINE FLUSH 0.9 % IV SOLN: 0.9 % | INTRAVENOUS | Qty: 40

## 2013-07-14 MED FILL — LEXAPRO 10 MG PO TABS: 10 MG | ORAL | Qty: 1

## 2013-07-14 MED FILL — SINGULAIR 10 MG PO TABS: 10 MG | ORAL | Qty: 1

## 2013-07-14 MED FILL — LOVENOX 30 MG/0.3ML SC SOLN: 30 MG/0.3ML | SUBCUTANEOUS | Qty: 0.3

## 2013-07-14 MED FILL — FUROSEMIDE 40 MG PO TABS: 40 MG | ORAL | Qty: 1

## 2013-07-14 MED FILL — MAGNESIUM SULFATE IN D5W 10-5 MG/ML-% IV SOLN: 10-5 MG/ML-% | INTRAVENOUS | Qty: 100

## 2013-07-14 MED FILL — ZOSYN 3-0.375 GM/50ML IV SOLN: INTRAVENOUS | Qty: 50

## 2013-07-14 MED FILL — SODIUM CHLORIDE 0.9 % IV SOLN: 0.9 % | INTRAVENOUS | Qty: 1000

## 2013-07-14 MED FILL — DIPHENHYDRAMINE HCL 50 MG/ML IJ SOLN: 50 MG/ML | INTRAMUSCULAR | Qty: 1

## 2013-07-14 NOTE — Progress Notes (Signed)
Assessment completed -see flowchart. Declines pain intervention. Abdominal dressing CD+I. JP in place to bulb suction, bloody drainage noted. Has been up walking in room frequently. Declines nausea. Has been using IS frequently. Voiding frequently, multiple loose BMs. Call bell in reach. Denies further needs. POC reviewed. Will continue to monitor.

## 2013-07-14 NOTE — Progress Notes (Signed)
[facility]  Aware discharge cancelled. [facility] [phone] in place for home infusion. Will need to call/fax final discharge orders to [facility] upon discharge. Discharge planner notified.

## 2013-07-14 NOTE — Progress Notes (Addendum)
[facility] Received home care referral. Spoke with pt re: home care plan of care/services. Agreeable. Pt aware of home TPN benefits. [employer] [phone] in place for home infusion.   Aware of discharge with home care. Home care/infusion orders faxed. Will plan start of care this pm.

## 2013-07-14 NOTE — Telephone Encounter (Signed)
Patient is s/p band removal 7/30/154, she is being discharged today. Her daughter Judeth Cornfield has concerns about incisions, drain tube, and potassium levels etc. She would like to speak with Dr Suzi Roots about these issues. Dr Suzi Roots spoke to her today

## 2013-07-14 NOTE — Progress Notes (Signed)
Morphine for pain -see MAR. Compression stockings on.

## 2013-07-14 NOTE — Progress Notes (Signed)
Pt resting in bed, with eyes closed. No s/s of distress noted. No S/S of pain  Noted at this time. VSS. Bed locked and in low position. Call light and bedside table within reach. Will continue to monitor and handoff to oncoming shift. Palin Tristan L Abdon Petrosky

## 2013-07-14 NOTE — Progress Notes (Signed)
Note discharge plan. OK to discharge with potassium as currently instituted. Medicine will sign off. Thank you for giving [place] the opportunity to participate in the care of this patient.

## 2013-07-14 NOTE — Discharge Summary (Signed)
Physician Discharge Summary     Patient ID:  Rachael Mays  [id number]  57 y.o.  June 04, 1956    Admit date: 07/05/2013    Discharge date and time: No discharge date for patient encounter.     Admitting Physician: Darrall Dears, MD     Discharge Physician: Suzi Roots    Admission Diagnoses: COMPLICATIONS OF GASTRIC BAND    Discharge Diagnoses: complication of gastric banding/band erosion    Admission Condition: good    Discharged Condition: good    Indication for Admission: Band Erosion    Hospital Course: per chart  Patient had lap band removal and surgery went well.  Had a post op fever.  CT scan showed a ? Of a gastric leak where the stomach may not have sealed.  clinincally looks very good.  Will treat as a leak with NPO and TPN.  Repeat UGI next week.    Consults: hospitalist    Significant Diagnostic Studies: Imaging    Treatments: surgery: band removal    Discharge Exam:  Per chart    Disposition: home    Patient Instructions:   Current Discharge Medication List      START taking these medications    Details   enoxaparin (LOVENOX) 30 MG/0.3ML injection Inject 0.4 mLs into the skin daily.  Qty: 40 mL, Refills: 0      oxyCODONE-acetaminophen (PERCOCET) 5-325 MG per tablet Take 1 tablet by mouth every 4 hours as needed for Pain for up to 7 days.  Qty: 30 tablet, Refills: 0      ondansetron (ZOFRAN) 4 MG tablet Take 1 tablet by mouth every 8 hours as needed for Nausea or Vomiting.  Qty: 30 tablet, Refills: 0      amoxicillin-clavulanate (AUGMENTIN) 875-125 MG per tablet Take 1 tablet by mouth 2 times daily for 10 days.  Qty: 20 tablet, Refills: 0      potassium chloride (KAYCIEL) 20 MEQ/15ML (10%) solution Take 30 mLs by mouth 2 times daily.  Qty: 600 mL, Refills: 0         CONTINUE these medications which have NOT CHANGED    Details   hydrochlorothiazide (HYDRODIURIL) 25 MG tablet Take 1 tablet by mouth daily.  Qty: 90 tablet, Refills: 1    Associated Diagnoses: Hypertension      ranitidine (ZANTAC) 150 MG  tablet Take 150 mg by mouth 2 times daily.      montelukast (SINGULAIR) 10 MG tablet Take 1 tablet by mouth daily.  Qty: 30 tablet, Refills: 3    Associated Diagnoses: Allergic rhinitis      fluocinonide (LIDEX) 0.05 % cream Apply topically 2 times daily.  Qty: 1 Tube, Refills: 1    Associated Diagnoses: Eczema      Wound Dressings (INTERDRY AG TEXTILE 10"X12') MISC Apply 1 each topically daily. Pt would like a call with price before filled  Qty: 1 each, Refills: 1    Associated Diagnoses: Intertrigo      gabapentin (NEURONTIN) 300 MG capsule TAKE ONE CAPSULE BY MOUTH NIGHTLY  Qty: 90 capsule, Refills: 0      cetirizine (ZYRTEC ALLERGY) 10 MG tablet Take 10 mg by mouth daily.      ALPRAZolam (XANAX) 0.5 MG tablet Take 1 tablet by mouth 2 times daily as needed for Sleep or Anxiety.  Qty: 60 tablet, Refills: 0    Comments: Called to [employer] [facility]      escitalopram (LEXAPRO) 20 MG tablet Take 20 mg by mouth nightly.  calcium carbonate 600 MG TABS tablet Take 1 tablet by mouth 2 times daily.        albuterol (PROVENTIL;VENTOLIN) 90 MCG/ACT inhaler Inhale 2 puffs into the lungs every 6 hours as needed.         STOP taking these medications       Vitamins-Lipotropics (LIPOFLAVONOID) TABS Comments:   Reason for Stopping:         HYDROcodone-acetaminophen (NORCO) 5-325 MG per tablet Comments:   Reason for Stopping:         Vitamin D (CHOLECALCIFEROL) 1000 UNITS CAPS capsule Comments:   Reason for Stopping:         Multiple Vitamin (MULTIVITAMIN PO) Comments:   Reason for Stopping:             Activity: no heavy lifting for 6 weeks  Diet: NPO/ TPN  Wound Care: keep wound clean and dry    Follow-up with Dr. Suzi Roots in 6 day.    Signed:  CJN   07/14/2013  10:46 AM

## 2013-07-14 NOTE — Progress Notes (Signed)
Pt wanted to stay one more night until home situation is set up, called Dr Suzi Roots, said it was ok to hold discharge. Rachael Mays

## 2013-07-14 NOTE — Progress Notes (Signed)
Am assessment done, pt is comfortable, am medications given. Ernest Haber

## 2013-07-14 NOTE — Progress Notes (Signed)
Patient pain controlled.  No n/v.  Ambulating and using IS use    Filed Vitals:    07/14/13 0655   BP: 119/70   Pulse: 66   Temp: 98 ??F (36.7 ??C)   Resp: 20     Abd: soft +mild incision tendernss at left incision/nd    JP minimal serous output    AP:  S/p band removal    ? Of small gastric leak.  She has been afebrile for 72+hrs  Will treat as if there is a small leak.  TPN/NPO  Hypokalemia- adjust per TPN    Will d/c on lovenox, oral abx, TPN and KCL replacement    Will get repeat UGI next week.  IF no leak, will pull JP and start liquids.  See in offie next thurs

## 2013-07-14 NOTE — Progress Notes (Signed)
Dressing changed at shift change. Rachael Mays

## 2013-07-15 LAB — POCT GLUCOSE
POC Glucose: 102 mg/dl — ABNORMAL HIGH (ref 70–99)
POC Glucose: 103 mg/dl — ABNORMAL HIGH (ref 70–99)
POC Glucose: 105 mg/dl — ABNORMAL HIGH (ref 70–99)
POC Glucose: 111 mg/dl — ABNORMAL HIGH (ref 70–99)
POC Glucose: 115 mg/dl — ABNORMAL HIGH (ref 70–99)
POC Glucose: 133 mg/dl — ABNORMAL HIGH (ref 70–99)

## 2013-07-15 LAB — BASIC METABOLIC PANEL
BUN: 10 mg/dL (ref 7–20)
CO2: 28 mmol/L (ref 21–32)
Calcium: 8.7 mg/dL (ref 8.3–10.6)
Chloride: 101 mmol/L (ref 99–110)
Creatinine: 0.7 mg/dL (ref 0.6–1.1)
GFR African American: >60 (ref >60)
GFR Non-African American: >60 (ref >60)
Glucose: 95 mg/dL (ref 70–99)
Potassium: 4 mmol/L (ref 3.5–5.1)
Sodium: 136 mmol/L (ref 136–145)

## 2013-07-15 MED ORDER — CEPHALEXIN 500 MG PO CAPS
500 MG | ORAL_CAPSULE | Freq: Four times a day (QID) | ORAL | Status: DC
Start: 2013-07-15 — End: 2013-07-15

## 2013-07-15 MED ADMIN — nystatin (MYCOSTATIN) powder: TOPICAL | @ 14:00:00 | NDC 00574200815

## 2013-07-15 MED ADMIN — sodium chloride flush 0.9 % injection 10 mL: INTRAVENOUS | @ 14:00:00

## 2013-07-15 MED ADMIN — piperacillin-tazobactam (ZOSYN) IVPB 3.375 g: INTRAVENOUS | @ 22:00:00 | NDC 00206886101

## 2013-07-15 MED ADMIN — enoxaparin (LOVENOX) injection 30 mg: SUBCUTANEOUS | @ 14:00:00 | NDC 00075801301

## 2013-07-15 MED ADMIN — PN-Adult Premix 5/20 - Standard Electrolytes - Central Line: INTRAVENOUS | @ 22:00:00 | NDC 00409665305

## 2013-07-15 MED ADMIN — piperacillin-tazobactam (ZOSYN) IVPB 3.375 g: INTRAVENOUS | @ 11:00:00 | NDC 00206886101

## 2013-07-15 MED ADMIN — escitalopram (LEXAPRO) tablet 10 mg: ORAL | @ 01:00:00 | NDC 68084061711

## 2013-07-15 MED ADMIN — enoxaparin (LOVENOX) injection 30 mg: SUBCUTANEOUS | @ 01:00:00 | NDC 00075801301

## 2013-07-15 MED ADMIN — montelukast (SINGULAIR) tablet 10 mg: ORAL | @ 14:00:00 | NDC 68084062011

## 2013-07-15 MED ADMIN — fluconazole (DIFLUCAN) 200 mg in 0.9 % NaCl 100 mL premix IVPB: INTRAVENOUS | @ 01:00:00 | NDC 00069003802

## 2013-07-15 MED ADMIN — promethazine (PHENERGAN) injection 12.5 mg: INTRAVENOUS | @ 14:00:00 | NDC 00641092821

## 2013-07-15 MED ADMIN — piperacillin-tazobactam (ZOSYN) IVPB 3.375 g: INTRAVENOUS | @ 05:00:00 | NDC 00206886101

## 2013-07-15 MED ADMIN — potassium chloride (KAYCIEL) 20 MEQ/15ML (10%) solution 20 mEq: ORAL | @ 14:00:00 | NDC 00121146530

## 2013-07-15 MED ADMIN — nystatin (MYCOSTATIN) powder: TOPICAL | @ 01:00:00 | NDC 00574200815

## 2013-07-15 MED ADMIN — scopolamine (TRANSDERM-SCOP) 1.5 MG 1 patch: TRANSDERMAL | @ 01:00:00 | NDC 10019055388

## 2013-07-15 MED ADMIN — PN-Adult Premix 5/20 - Standard Electrolytes - Central Line: INTRAVENOUS | @ 23:00:00 | NDC 00409665305

## 2013-07-15 MED ADMIN — sodium chloride flush 0.9 % injection 10 mL: INTRAVENOUS | @ 01:00:00

## 2013-07-15 MED ADMIN — furosemide (LASIX) tablet 40 mg: ORAL | @ 14:00:00 | NDC 00904579761

## 2013-07-15 MED ADMIN — diphenhydrAMINE (BENADRYL) injection 25 mg: INTRAVENOUS | @ 07:00:00 | NDC 00641037621

## 2013-07-15 MED ADMIN — 0.9 % sodium chloride infusion: INTRAVENOUS | @ 01:00:00 | NDC 00338004904

## 2013-07-15 MED ADMIN — oxyCODONE-acetaminophen (ROXICET) 5-325 MG/5ML solution 10 mL: 10 mL | ORAL | @ 16:00:00 | NDC 00054864816

## 2013-07-15 MED ADMIN — morphine (PF) injection 2 mg: 2 mg | INTRAVENOUS | @ 21:00:00 | NDC 00409189001

## 2013-07-15 MED ADMIN — morphine (PF) injection 4 mg: 4 mg | INTRAVENOUS | @ 07:00:00 | NDC 00409189101

## 2013-07-15 MED ADMIN — morphine (PF) injection 4 mg: 4 mg | INTRAVENOUS | @ 03:00:00 | NDC 00409189101

## 2013-07-15 MED ADMIN — morphine (PF) injection 4 mg: 4 mg | INTRAVENOUS | @ 14:00:00 | NDC 00409189101

## 2013-07-15 MED FILL — MORPHINE SULFATE (PF) 4 MG/ML IV SOLN: 4 mg/mL | INTRAVENOUS | Qty: 1

## 2013-07-15 MED FILL — NORMAL SALINE FLUSH 0.9 % IV SOLN: 0.9 % | INTRAVENOUS | Qty: 10

## 2013-07-15 MED FILL — TRANSDERM-SCOP (1.5 MG) 1 MG/3DAYS TD PT72: 1 MG/3DAYS | TRANSDERMAL | Qty: 1

## 2013-07-15 MED FILL — LOVENOX 30 MG/0.3ML SC SOLN: 30 MG/0.3ML | SUBCUTANEOUS | Qty: 0.3

## 2013-07-15 MED FILL — MORPHINE SULFATE (PF) 2 MG/ML IV SOLN: 2 mg/mL | INTRAVENOUS | Qty: 1

## 2013-07-15 MED FILL — SINGULAIR 10 MG PO TABS: 10 MG | ORAL | Qty: 1

## 2013-07-15 MED FILL — ROXICET 5-325 MG/5ML PO SOLN: 5-325 MG/5ML | ORAL | Qty: 10

## 2013-07-15 MED FILL — SODIUM CHLORIDE 0.9 % IV SOLN: 0.9 % | INTRAVENOUS | Qty: 1000

## 2013-07-15 MED FILL — CLINIMIX E/DEXTROSE (5/20) 5 % IV SOLN: 5 % | INTRAVENOUS | Qty: 2000

## 2013-07-15 MED FILL — DIPHENHYDRAMINE HCL 50 MG/ML IJ SOLN: 50 MG/ML | INTRAMUSCULAR | Qty: 1

## 2013-07-15 MED FILL — LEXAPRO 10 MG PO TABS: 10 MG | ORAL | Qty: 1

## 2013-07-15 MED FILL — FLUCONAZOLE IN SODIUM CHLORIDE 200-0.9 MG/100ML-% IV SOLN: INTRAVENOUS | Qty: 100

## 2013-07-15 MED FILL — PROMETHAZINE HCL 25 MG/ML IJ SOLN: 25 MG/ML | INTRAMUSCULAR | Qty: 1

## 2013-07-15 MED FILL — FUROSEMIDE 40 MG PO TABS: 40 MG | ORAL | Qty: 1

## 2013-07-15 MED FILL — POTASSIUM CHLORIDE 20 MEQ/15ML (10%) PO SOLN: 20 MEQ/15ML (10%) | ORAL | Qty: 30

## 2013-07-15 NOTE — Progress Notes (Signed)
Discharge Planning:  Tubbed a home care list to [facility], RN to give to patient.   Left a message for the [employer] Pharmacist to call me regarding the TPN that is scheduled to be delivered, awaiting response.  Will Continue to be followed through discharge.  Margarita Mail, MSW, [place]

## 2013-07-15 NOTE — Progress Notes (Signed)
Discharge Planning:  Spoke with Jerolyn Center from [facility], informed Felipe Drone that patient will be discharged today, on TPN and will need a visit from the nurse today.   Tamkia stated that she will call [employer] to inquire when they will be dropping off the TPN to patient's home, and will schedule a time for the nurse to visit with patient today, informed Hospital doctor, Electrical engineer.  Awaiting return telephone call from [facility].  Will Continue to be followed through discharge.  Margarita Mail, MSW, [place]

## 2013-07-15 NOTE — Progress Notes (Signed)
Patient resting in bed, A&Ox4, BP 102/65   Pulse 56   Temp(Src) 98 ??F (36.7 ??C) (Oral)   Resp 16   Ht 5\' 6"  (1.676 m)   Wt 236 lb (107.049 kg)   BMI 38.11 kg/m2   SpO2 95%  Dr. Rozanna Boer in to see patient and packed left abdominal incision with iodoform packing.  Iv morphine given during packing of wound.  Discharge orders in place.  Faxed coc home care forms including tpn orders to [employer] and [facility].  Spoke with [employer] and they said they would be delivering TPN at 1700-1800 tonight.  JP to bulb suction.  TPN currently infusing.  Call light in reach.

## 2013-07-15 NOTE — Plan of Care (Signed)
Problem: SAFETY  Goal: Free from accidental physical injury  Outcome: Ongoing  Free from injury so far this shift. Appropriate fall precautions in place. Call bell in reach. ID band on. Will continue to monitor.         Problem: PAIN  Goal: Patient???s pain/discomfort is manageable  Outcome: Ongoing  Medicated with morphine for pain -see MAR. patient calls with pain medication needs. Is up frequently ambulating in room. Ice pack for pain per patient request. Will continue to monitor.    Problem: SKIN INTEGRITY  Goal: Skin integrity is maintained or improved  Outcome: Ongoing  Turns and repositions self. Up frequently and walks in room. Skin clean and dry. Will continue to monitor.

## 2013-07-15 NOTE — Progress Notes (Signed)
Discharge Planning:  Left a telephone message with [employer]'s Answering Service, awaiting return call.  Will Continue to be followed through discharge.  Margarita Mail, MSW, [place]

## 2013-07-15 NOTE — Progress Notes (Signed)
Discharge Planning:  Received a return telephone call from Maurice at [employer], stating that agency can deliver the TPN to patient's home around 7:00 p.m. , informed Hospital doctor, Electrical engineer.  Spoke with Lupita Leash, nurse at [facility], they will not be able to see this patient today at discharge, due not having enough staff.  Lupita Leash suggested to hold discharge or find another home care agency.   Informed Amber, floor nurse that I will attempt to secure another home care agency.  Will Continue to be followed through discharge.    Jenny Reichmann, MSW, LSW

## 2013-07-15 NOTE — Progress Notes (Signed)
Discharge Planning:  Received a telephone call from Montague at [facility], agency does not have nursing.  Plan is for patient to discharge on 07/16/13 with [facility] and [facility], unable to secure nursing services for patient today.   Spoke with Misty Stanley from [employer], informed Misty Stanley that patient will not be discharged on today.  Misty Stanley stated that TPN can be delivered on tomorrow.   Will Continue to be followed through discharge.  Margarita Mail, MSW, [place]

## 2013-07-15 NOTE — Discharge Instructions (Signed)
All home medications have been reviewed, questions answered and patient voiced understanding    What to do after you leave the hospital:    Recommended diet: Follow Dr Judithann Sheen diet instructions, no eating or drinking until you follow up with Dr. Suzi Roots, you may have few ice chips to keep mouth moist, and it is ok to take medications by mouth.    Recommended activity: Walk as often as you feel able; Increase your activity slowly; Do not lift anything heavier than 10 pounds until okay by Dr. Suzi Roots; Avoid strenous chores, such as vacuuming or lifting full bags of garbage until okay with Dr. Suzi Roots  - Keep incisions clean and dry. Wash the incision gently with mild soap and warm water. Gently pat incision dry with a towel.  - No driving for 2 weeks after surgery or while you are getting narcotic pain medications      If you experience any of the following symptoms: fever greater than 101; fast pulse; night sweats; persistant pain, nausea, or vomitting after eating; diarrhea beyond the first week after you leave the hospital; pain in your upper back, chest, or left shoulder; persistant hiccups;  please follow up with Dr Suzi Roots.    Follow-up with Dr Suzi Roots in in 2 weeks. Office number [phone]    We always want to make sure you have the help you need when you go home.  If you do not feel you have the help you need when you go home then please ask your nurse or discharge planner prior to leaving the hospital.    We thank you for choosing [facility].  We hope that you had a positive experience and that we provided the very best care during your stay.     By signing below, I understand that if any problems occur once I leave the hospital I am to contact Dr Suzi Roots.  I understand and acknowledge receipt of the instructions indicated above.     [facility] [phone]   [facility] [phone] in place for home infusion.    Peripherally Inserted Central Catheter (PICC): After Your Visit  Your  Care Instructions  A peripherally inserted central catheter (PICC) is a soft, flexible tube that runs under your skin from a vein in your arm to a large vein near your heart. One end of the catheter stays outside your body. It is a type of central venous catheter, or central venous line. You may have it for weeks or months.  A PICC is used to give you medicine, blood products, nutrients, or fluids. A PICC makes doing these things more comfortable for you because they are put directly into the catheter. So you will not be stuck with a needle every time. A PICC also can be used to draw blood for tests. The end of the PICC sometimes has two or three openings so that you can get more than one type of fluid or medicine at a time.  Your doctor may give you medicine to make you feel relaxed. You may feel a little pain when your doctor numbs your arm. Your doctor will then thread the catheter up a vein in your arm to a larger vein. You will not feel any pain. The doctor may use stitches or other devices to hold the catheter in place where it exits your arm.  After the procedure, the site may be sore for a day or two.  Follow-up care is a key part of your  treatment and safety. Be sure to make and go to all appointments, and call your doctor if you are having problems. It's also a good idea to know your test results and keep a list of the medicines you take.  How can you care for yourself at home?   Do not wear jewelry, such as necklaces, that can catch on the catheter.   If the catheter breaks, follow the instructions your doctor gave you. If you have no instructions, clamp or tie off the catheter. Then see a doctor as soon as possible.   To help prevent infection, take a shower instead of a bath. Do not go swimming with the catheter.   Try to keep the area dry. When you shower, cover the area with waterproof material, such as plastic wrap.   Never touch the open end of the catheter if the cap is off.   Never use  scissors, knives, pins, or other sharp objects near the catheter or other tubing.   If your catheter has a clamp, keep it clamped when you are not using it.   Fasten or tape the catheter to your body to prevent pulling or dangling.   Avoid clothing that rubs or pulls on your catheter.   Avoid bending or crimping your catheter.   Always wash your hands before you touch your catheter.   Wear loose clothing over the catheter for the first 10 to 14 days. When getting dressed, be careful not to pull on the catheter.  When should you call for help?  Call 911 anytime you think you may need emergency care. For example, call if:   You passed out (lost consciousness).   You have severe trouble breathing.   You have sudden chest pain and shortness of breath, or you cough up blood.   You have a fast or uneven pulse.  Call your doctor now or seek immediate medical care if:   You have signs of infection, such as:   Increased pain, swelling, warmth, or redness.   Red streaks leading from the area.   Pus or blood draining from the area.   A fever.   You have swelling in your face, chest, neck, or arm on the side where the catheter is.   You have signs of a blood clot, such as bulging veins near the catheter.   Your catheter is leaking, cracked, or clogged.   You feel resistance when you inject medicine or fluids into your catheter.   Your catheter is out of place. This may happen after severe coughing or vomiting, or if you pull on the catheter.   You have chest pain or shortness of breath.  Watch closely for changes in your health, and be sure to contact your doctor if:   You have any concerns about your catheter.  How can you care for yourself at home? Fluid overload  You will get more details on the specific medicines your doctor prescribes.  Weight   Weigh yourself without clothing at the same time each day. Record your weight. Call your doctor if you gain more than 3 pounds in 2 to 3 days. A sudden  weight gain may mean that your 'overload' is getting worse.  Activity level   Start light exercise (if your doctor says it is okay). Even if you can only do a small amount, exercise will help you get stronger, have more energy, and manage your weight and your stress. Walking is an easy way to get  exercise. Start out by walking a little more than you did before. Bit by bit, increase the amount you walk.   When you exercise, watch for signs that your heart is working too hard. You are pushing yourself too hard if you cannot talk while you are exercising. If you become short of breath or dizzy or have chest pain, stop, sit down, and rest.   If you feel "wiped out" the day after you exercise, walk slower or for a shorter distance until you can work up to a better pace.   Get enough rest at night. Sleeping with 1 or 2 pillows under your upper body and head may help you breathe easier.  Lifestyle changes   Do not smoke. Smoking can make a heart condition worse. If you need help quitting, talk to your doctor about stop-smoking programs and medicines. These can increase your chances of quitting for good. Quitting smoking may be the most important step you can take to protect your heart.  When should you call for help?  Call 911 if you have symptoms of fluid overload such as:   You have severe trouble breathing.   You cough up pink, foamy mucus.   You have a new irregular or rapid heartbeat.  Call your doctor now or seek immediate medical care if:   You have new or increased shortness of breath.   You are dizzy or lightheaded, or you feel like you may faint.   You have sudden weight gain, such as 3 pounds or more in 2 to 3 days.   You have increased swelling in your legs, ankles, or feet.   You are suddenly so tired or weak that you cannot do your usual activities.  Watch closely for changes in your health, and be sure to contact your doctor if:   You develop new symptoms.  Surgical Drain Care: After Your  Visit  What is a surgical drain?  After a surgery, fluid may collect inside your body in the surgical area. This makes an infection or other problems more likely. A surgical drain allows the fluid to flow out.  The doctor will put a thin rubber tube into the area of your body where the fluid is likely to collect. The rubber tube will carry the fluid outside your body. The most common type of surgical drain carries the fluid into a collection bulb that you empty. This is called a Jackson-Pratt drain. The drain uses suction created by the bulb to pull the fluid from your body into the bulb.  The rubber tube will probably be held in place by one or two stitches in your skin. Most people attach the bulb with a safety pin to clothing or near the bandage so that it doesn't flip around or pull on the stitches.  When you first get the drain, the fluid will be bloody. It will change color from red to pink to a light yellow or clear as the wound heals and the fluid starts to go away.  How can you care for yourself at home?  Fluid collection  Follow any instructions your doctor gives you. How often you empty the bulb depends on how much fluid is draining. Empty the bulb when it is half full.  To empty the bulb:   Wash your hands with soap and water.   Take the plug out of the bulb.   Empty the bulb. If your doctor asks you to measure the fluid, empty the fluid into  a measuring cup, and write down how much you collected.   Clean the plug and put it back into the bulb.   Use alcohol to clean the plug.   Squeeze the bulb until it is flat. This removes all the air from the bulb. You may need to put the bulb on a table or a counter to flatten it.   Keep the bulb flat and put the plug in.   The bulb should stay flat after you put the plug back in. This creates the suction that pulls the fluid into the bulb.   Empty the fluid into the toilet.   Wash your hands.  Bandage care  You may have a bandage. Your doctor will tell  you how often to change it.   Wash your hands with soap and water.   Take off the bandage from around the drain.   Clean the drain site and the skin around it with soap and water. Use gauze or a cotton swab.   When the site is dry, put on a new bandage.  Drain care  Squeezing or "milking" the tube can help prevent clogs so that it drains correctly. Your doctor will tell you when you need to do this. In general, you do this when:   You see a clot in the tube that is preventing fluid from draining. The clot may look like a dark, stringy lining.   You see fluid leaking around the tube where it goes into the skin.   You think there is no suction in the drain.  When should you call for help?  Call your doctor now or seek immediate medical care if:   You have signs of infection, such as:   Increased pain, swelling, warmth, or redness around the area.   Red streaks leading from the area.   Pus draining from the area.   A fever.   You see a sudden change in the color or smell of the drainage.   The tube is coming loose where it leaves your skin.  Watch closely for changes in your health, and be sure to contact your doctor if:   You see a lot of fluid around the drain.

## 2013-07-15 NOTE — Progress Notes (Signed)
Patient will stay another night in hospital(see social work notes).  Patient ok with that.  Cyclic tpn started at 1800 at 50 ml/hr then increased to 190 ml/hr at 1900.  IVF stopped during increased rate of tpn.

## 2013-07-15 NOTE — Progress Notes (Signed)
Discharge Planning:  Met with patient to obtain other home care choices.  Patient would like referrals made to: [facility], not in office until Monday, 8/11, [facility], left a telephone message, [facility], spoke with Claris Che, agency can not accept this patient, no nurse available.  Spoke with Tammy from [facility], agency does not accept [facility], neither does [facility] per Tammy, which is the agency that they use.  Informed Hospital doctor, RN, she will inform patient, discharge most likely on tomorrow with [facility] and [facility].   Left a telephone message for Misty Stanley at [facility] to inform her that patient will be discharged on 07/16/13 per Joice Lofts, RN floor nurse.  Informed Judeth Cornfield, Press photographer.  Left a message for Burna Mortimer, RN with [facility] to follow up.  Will Continue to be followed through discharge.  Margarita Mail, MSW, [place]

## 2013-07-15 NOTE — Progress Notes (Signed)
Discharge Planning:  Received a telephone call from Tamkia at [facility] stating that she is still awaiting a return telephone call from [facility] as to when they will deliver the TPN, Felipe Drone will keep me updated.  Informed Hospital doctor, Electrical engineer.  Will Continue to be followed through discharge.  Margarita Mail, MSW, [place]

## 2013-07-15 NOTE — Progress Notes (Signed)
PM assessment complete, see flowsheet. POC discussed with and family. Both verbalize understanding. Denies any needs at this time. Call light in reach, bed alarm on, will continue to monitor.

## 2013-07-15 NOTE — Progress Notes (Signed)
Discharge Planning:  Received a telephone call from [place] at [facility], informed Misty Stanley that home care agency has not been secured as of yet.  Misty Stanley stated that she spoke with patient and with Hospital doctor, Charity fundraiser.   Will keep [facility] Informed.  Will Continue to be followed through discharge.  Margarita Mail, MSW, [place]

## 2013-07-15 NOTE — Progress Notes (Signed)
Patient given list of home care companies to chose from as [facility] is unable to come to her house tonight.  Family looking over choices.  Patient updated at this point that [facility] can deliver the TPN, we just need to find a home care company to come out tonight.  Pharmacy made aware here that patient may or may not need TPN here inpatient tonight.  Will continue to update patient of changes.

## 2013-07-15 NOTE — Progress Notes (Signed)
[facility]   [facility]         Pt seen and examined    S/p lap band removal for erosion     Denies any nausea, vomiting. Pain well controlled.     Filed Vitals:    07/14/13 2340 07/15/13 0306 07/15/13 0721 07/15/13 0923   BP: 121/75 115/73 102/65    Pulse: 60 59 56    Temp: 99.3 ??F (37.4 ??C) 99 ??F (37.2 ??C) 98 ??F (36.7 ??C)    TempSrc: Oral Oral Oral    Resp: 16 16 16     Height:       Weight:       SpO2: 98% 95% 96% 95%       Abdomen is soft, appropriately tender, incisions stable with erythema and drainage from left port site, 1 cm part of that wound opened and drained and packed.     Data  CBC:   Lab Results   Component Value Date    WBC 5.0 07/09/2013    RBC 4.25 07/09/2013    HGB 11.9 07/09/2013    HCT 36.0 07/09/2013    MCV 84.8 07/09/2013    MCH 28.0 07/09/2013    MCHC 33.1 07/09/2013    RDW 13.9 07/09/2013    PLT 169 07/09/2013    MPV 8.6 07/09/2013     CMP:    Lab Results   Component Value Date    NA 136 07/15/2013    K 4.0 07/15/2013    CL 101 07/15/2013    CO2 28 07/15/2013    BUN 10 07/15/2013    CREATININE 0.7 07/15/2013    CREATININE 0.8 05/15/2011    GFRAA >60 07/15/2013    GFRAA >60 03/16/2012    AGRATIO 0.9 07/14/2013    LABGLOM >60 07/15/2013    GLUCOSE 95 07/15/2013    PROT 6.3 07/14/2013    PROT 7.3 03/31/2011    LABALBU 3.0 07/14/2013    CALCIUM 8.7 07/15/2013    BILITOT 0.4 07/14/2013    ALKPHOS 92 07/14/2013    AST 23 07/14/2013    ALT 32 07/14/2013       Current Inpatient Medications    Current facility-administered medications:PN-Adult Premix 5/20 - Standard Electrolytes - Central Line, , Intravenous, Continuous TPN  potassium chloride (KAYCIEL) 20 MEQ/15ML (10%) solution 20 mEq, 20 mEq, Oral, Daily  fluconazole (DIFLUCAN) 200 mg in 0.9 % NaCl 100 mL premix IVPB, 200 mg, Intravenous, Q24H  nystatin (MYCOSTATIN) powder, , Topical, BID  morphine (PF) injection 4 mg, 4 mg, Intravenous, Q3H PRN  sodium chloride flush 0.9 % injection 10 mL, 10 mL, Intravenous, Q12H SCH  sodium chloride flush 0.9 % injection 10  mL, 10 mL, Intravenous, PRN  montelukast (SINGULAIR) tablet 10 mg, 10 mg, Oral, Daily  escitalopram (LEXAPRO) tablet 10 mg, 10 mg, Oral, Nightly  glucose (GLUTOSE) 40 % oral gel 15 g, 15 g, Oral, PRN  dextrose 50 % solution 12.5 g, 12.5 g, Intravenous, PRN  glucagon (rDNA) injection 1 mg, 1 mg, Intramuscular, PRN  dextrose 5 % solution, 100 mL/hr, Intravenous, PRN  insulin lispro (HUMALOG) injection pen 0-6 Units, 0-6 Units, Subcutaneous, Q4H  albuterol (PROVENTIL HFA;VENTOLIN HFA) 108 (90 BASE) MCG/ACT inhaler 2 puff, 2 puff, Inhalation, Q4H PRN  0.9 % sodium chloride infusion, , Intravenous, Continuous  sodium chloride (PF) 0.9 % injection 20 mL, 20 mL, Intravenous, PRN  piperacillin-tazobactam (ZOSYN) IVPB 3.375 g, 3.375 g, Intravenous, Q6H  scopolamine (TRANSDERM-SCOP) 1.5 MG  1 patch, 1 patch, Transdermal, Q72H  ondansetron (ZOFRAN-ODT) disintegrating tablet 4 mg, 4 mg, Oral, Q6H PRN  furosemide (LASIX) tablet 40 mg, 40 mg, Oral, Daily  morphine (PF) injection 2 mg, 2 mg, Intravenous, Q2H PRN  oxyCODONE-acetaminophen (ROXICET) 5-325 MG/5ML solution 10 mL, 10 mL, Oral, Q4H PRN  oxyCODONE-acetaminophen (ROXICET) 5-325 MG/5ML solution 5 mL, 5 mL, Oral, Q4H PRN  acetaminophen (TYLENOL) tablet 650 mg, 650 mg, Oral, Q4H PRN  naloxone (NARCAN) injection 0.4 mg, 0.4 mg, Intravenous, PRN  enoxaparin (LOVENOX) injection 30 mg, 30 mg, Subcutaneous, Q12H SCH  diphenhydrAMINE (BENADRYL) injection 25 mg, 25 mg, Intravenous, Q6H PRN  promethazine (PHENERGAN) injection 12.5 mg, 12.5 mg, Intravenous, Q6H PRN    Patient Active Problem List   Diagnosis   ??? Back Pain   ??? Deep Vein Thrombosis   ??? Depression   ??? High Cholesterol   ??? Hypertension   ??? Pulmonary Embolism   ??? Abdominal  Pain, Other Specified Site   ??? Morbid obesity   ??? Status post gastric banding   ??? Vertigo   ??? Dizziness   ??? Tinnitus, subjective   ??? Meniere syndrome   ??? Complication of gastric banding         A/P  Rachael Mays is a 57 y.o. female s/p lap band removal  , Stable overall.  Plan for home on TPN and wound packing QD.

## 2013-07-15 NOTE — Progress Notes (Signed)
Morphine for pain, benadryl for itching -see MAR. Will get ice chips per patient request. IS use encouraged.

## 2013-07-16 LAB — BASIC METABOLIC PANEL
BUN: 19 mg/dL (ref 7–20)
CO2: 27 mmol/L (ref 21–32)
Calcium: 9.1 mg/dL (ref 8.3–10.6)
Chloride: 101 mmol/L (ref 99–110)
Creatinine: 0.7 mg/dL (ref 0.6–1.1)
GFR African American: >60 (ref >60)
GFR Non-African American: >60 (ref >60)
Glucose: 81 mg/dL (ref 70–99)
Potassium: 4.9 mmol/L (ref 3.5–5.1)
Sodium: 136 mmol/L (ref 136–145)

## 2013-07-16 LAB — POCT GLUCOSE
POC Glucose: 105 mg/dl — ABNORMAL HIGH (ref 70–99)
POC Glucose: 108 mg/dl — ABNORMAL HIGH (ref 70–99)
POC Glucose: 108 mg/dl — ABNORMAL HIGH (ref 70–99)
POC Glucose: 80 mg/dl (ref 70–99)
POC Glucose: 84 mg/dl (ref 70–99)

## 2013-07-16 MED ADMIN — sodium chloride flush 0.9 % injection 10 mL: INTRAVENOUS | @ 13:00:00

## 2013-07-16 MED ADMIN — escitalopram (LEXAPRO) tablet 10 mg: ORAL | @ 01:00:00 | NDC 68084061711

## 2013-07-16 MED ADMIN — enoxaparin (LOVENOX) injection 30 mg: SUBCUTANEOUS | @ 13:00:00 | NDC 00075801301

## 2013-07-16 MED ADMIN — enoxaparin (LOVENOX) injection 30 mg: SUBCUTANEOUS | @ 01:00:00 | NDC 00075801301

## 2013-07-16 MED ADMIN — piperacillin-tazobactam (ZOSYN) IVPB 3.375 g: INTRAVENOUS | @ 06:00:00 | NDC 00206886101

## 2013-07-16 MED ADMIN — fluconazole (DIFLUCAN) 200 mg in 0.9 % NaCl 100 mL premix IVPB: INTRAVENOUS | @ 01:00:00 | NDC 00338604648

## 2013-07-16 MED ADMIN — nystatin (MYCOSTATIN) powder: TOPICAL | @ 01:00:00 | NDC 00574200815

## 2013-07-16 MED ADMIN — nystatin (MYCOSTATIN) powder: TOPICAL | @ 13:00:00 | NDC 00574200815

## 2013-07-16 MED ADMIN — piperacillin-tazobactam (ZOSYN) IVPB 3.375 g: INTRAVENOUS | @ 10:00:00 | NDC 00206886101

## 2013-07-16 MED ADMIN — diphenhydrAMINE (BENADRYL) injection 25 mg: INTRAVENOUS | @ 01:00:00 | NDC 00641037621

## 2013-07-16 MED ADMIN — diphenhydrAMINE (BENADRYL) injection 25 mg: INTRAVENOUS | @ 08:00:00 | NDC 00641037621

## 2013-07-16 MED ADMIN — montelukast (SINGULAIR) tablet 10 mg: ORAL | @ 13:00:00 | NDC 68084062011

## 2013-07-16 MED ADMIN — furosemide (LASIX) tablet 40 mg: ORAL | @ 13:00:00 | NDC 00904579761

## 2013-07-16 MED ADMIN — sodium chloride flush 0.9 % injection 10 mL: INTRAVENOUS | @ 01:00:00

## 2013-07-16 MED ADMIN — morphine (PF) injection 2 mg: 2 mg | INTRAVENOUS | @ 08:00:00 | NDC 00409189001

## 2013-07-16 MED ADMIN — morphine (PF) injection 2 mg: 2 mg | INTRAVENOUS | @ 13:00:00 | NDC 00409189001

## 2013-07-16 MED ADMIN — morphine (PF) injection 4 mg: 4 mg | INTRAVENOUS | @ 06:00:00 | NDC 00409189101

## 2013-07-16 MED ADMIN — morphine (PF) injection 4 mg: 4 mg | INTRAVENOUS | @ 01:00:00 | NDC 00409189101

## 2013-07-16 MED FILL — MORPHINE SULFATE (PF) 2 MG/ML IV SOLN: 2 mg/mL | INTRAVENOUS | Qty: 1

## 2013-07-16 MED FILL — NORMAL SALINE FLUSH 0.9 % IV SOLN: 0.9 % | INTRAVENOUS | Qty: 10

## 2013-07-16 MED FILL — SINGULAIR 10 MG PO TABS: 10 MG | ORAL | Qty: 1

## 2013-07-16 MED FILL — LOVENOX 30 MG/0.3ML SC SOLN: 30 MG/0.3ML | SUBCUTANEOUS | Qty: 0.3

## 2013-07-16 MED FILL — DIPHENHYDRAMINE HCL 50 MG/ML IJ SOLN: 50 MG/ML | INTRAMUSCULAR | Qty: 1

## 2013-07-16 MED FILL — LEXAPRO 10 MG PO TABS: 10 MG | ORAL | Qty: 1

## 2013-07-16 MED FILL — ZOSYN 3-0.375 GM/50ML IV SOLN: INTRAVENOUS | Qty: 50

## 2013-07-16 MED FILL — MORPHINE SULFATE (PF) 4 MG/ML IV SOLN: 4 mg/mL | INTRAVENOUS | Qty: 1

## 2013-07-16 MED FILL — FLUCONAZOLE IN SODIUM CHLORIDE 200-0.9 MG/100ML-% IV SOLN: INTRAVENOUS | Qty: 100

## 2013-07-16 MED FILL — SODIUM CHLORIDE 0.9 % IV SOLN: 0.9 % | INTRAVENOUS | Qty: 1000

## 2013-07-16 MED FILL — MORPHINE SULFATE (PF) 4 MG/ML IV SOLN: 4 MG/ML | INTRAVENOUS | Qty: 1

## 2013-07-16 MED FILL — FUROSEMIDE 40 MG PO TABS: 40 MG | ORAL | Qty: 1

## 2013-07-16 NOTE — Plan of Care (Signed)
Problem: PAIN  Goal: Patient???s pain/discomfort is manageable  Outcome: Ongoing  Pt able to communicate pain using 0-10 pain scale. Medications given per pt request as ordered for pain. Pt states pain is managed thus far with medications given. Will continue to monitor and assess pain.     Problem: Falls - Risk of  Goal: Absence of falls  Outcome: Ongoing  Pt free from falls this shift. Fall precautions in place: orange blanket visible, fall armband in place, call light in reach, bed alarm on, pt educated to use call light for assistance.

## 2013-07-16 NOTE — Progress Notes (Signed)
Pt seen and examined  Home health not available until today  Wound much better with drainage and packing   DC home

## 2013-07-16 NOTE — Progress Notes (Addendum)
I called and spoke with Burna Mortimer at [facility] home care to inform her the patient would be discharged today and need a nurse to start cyclic tpn at 1900 tonight.  Reminded her that patient had discharge order yesterday but could not go home as [facility] did not have a nurse to send to her house yesterday.  Burna Mortimer said there is a nurse scheduled to come to patient's house tonight, she just needs to page on-call nurse now to verify and find out which nurse it will be.  [facility] called to notify of discharge and need for tpn to be delivered to her house tonight.  Order will be faxed now.    1020  [facility] called back and I spoke with Burna Mortimer.  She said there is a nurse scheduled to be at patient's house this evening to administer TPN.  Discharge paperwork complete, abdominal dressing packed with iodoform strips.  Awaiting family to come pick her up.    1230  Patient given discharge instructions including to f/u with Dr. Suzi Roots in 4 days of Upper GI xray.  Also instructed on dressing care, JP drain care, lovenox administration to be done by home health nurse.  Home care to come out tonight to administer cyclic tpn.  Patient verbalized understanding, discharged home with husband.

## 2013-07-16 NOTE — Progress Notes (Signed)
Pt resting in bed with eyes closed. VSS. No s/s of distress. Breathing is easy and unlabored. Call light in reach, will continue to monitor.

## 2013-07-17 NOTE — Telephone Encounter (Signed)
Dr Suzi Roots spoke to the pharmacist

## 2013-07-18 LAB — CBC WITH AUTO DIFFERENTIAL
Basophils %: 0.4 %
Basophils Absolute: 0 10*3/uL (ref 0.0–0.2)
Eosinophils %: 2.6 %
Eosinophils Absolute: 0.3 10*3/uL (ref 0.0–0.6)
Hematocrit: 40.7 % (ref 36.0–48.0)
Hemoglobin: 13.1 g/dL (ref 12.0–16.0)
Lymphocytes %: 15.9 %
Lymphocytes Absolute: 1.6 10*3/uL (ref 1.0–5.1)
MCH: 27.6 pg (ref 26.0–34.0)
MCHC: 32.2 g/dL (ref 31.0–36.0)
MCV: 85.8 fL (ref 80.0–100.0)
MPV: 8.2 fL (ref 5.0–10.5)
Monocytes %: 10.3 %
Monocytes Absolute: 1.1 10*3/uL (ref 0.0–1.3)
Neutrophils %: 70.8 %
Neutrophils Absolute: 7.3 10*3/uL (ref 1.7–7.7)
Platelets: 369 10*3/uL (ref 135–450)
RBC: 4.74 M/uL (ref 4.00–5.20)
RDW: 14.7 % (ref 12.4–15.4)
WBC: 10.3 10*3/uL (ref 4.0–11.0)

## 2013-07-18 LAB — COMPREHENSIVE METABOLIC PANEL
ALT: 41 U/L — ABNORMAL HIGH (ref 10–40)
AST: 27 U/L (ref 15–37)
Albumin/Globulin Ratio: 1 — ABNORMAL LOW (ref 1.1–2.2)
Albumin: 3.6 g/dL (ref 3.4–5.0)
Alkaline Phosphatase: 112 U/L (ref 40–129)
BUN: 29 mg/dL — ABNORMAL HIGH (ref 7–20)
CO2: 25 mmol/L (ref 21–32)
Calcium: 9.5 mg/dL (ref 8.3–10.6)
Chloride: 100 mmol/L (ref 99–110)
Creatinine: 0.9 mg/dL (ref 0.6–1.1)
GFR African American: >60 (ref >60)
GFR Non-African American: >60 (ref >60)
Globulin: 3.6 g/dL
Glucose: 88 mg/dL (ref 70–99)
Potassium: 4.8 mmol/L (ref 3.5–5.1)
Sodium: 137 mmol/L (ref 136–145)
Total Bilirubin: 0.6 mg/dL (ref 0.0–1.0)
Total Protein: 7.2 g/dL (ref 6.4–8.2)

## 2013-07-18 LAB — OSMOLALITY: Osmolality: 297 mOsm/kg — ABNORMAL HIGH (ref 275–295)

## 2013-07-18 LAB — TRANSFERRIN: Transferrin: 297 mg/dL (ref 200.0–360.0)

## 2013-07-18 LAB — PREALBUMIN: Prealbumin: 16.4 mg/dL — ABNORMAL LOW (ref 20.0–40.0)

## 2013-07-18 LAB — PHOSPHORUS: Phosphorus: 3.8 mg/dL (ref 2.5–4.9)

## 2013-07-18 LAB — MAGNESIUM: Magnesium: 2.6 mg/dL — ABNORMAL HIGH (ref 1.80–2.40)

## 2013-07-18 LAB — TRIGLYCERIDES: Triglycerides: 231 mg/dL — ABNORMAL HIGH (ref 0–150)

## 2013-07-18 NOTE — Telephone Encounter (Signed)
Okay.

## 2013-07-18 NOTE — Telephone Encounter (Signed)
Patient just had surgery on 07/05/13 and just got out of the hospital on 07/16/13. She cancelled the appt for 6 week follow-up tomorrow due to complications after surgery. Her daughter rescheduled appt to 08/09/2013 but would like for her to be seen next week if appts open up.

## 2013-07-20 NOTE — Telephone Encounter (Signed)
Patients husband called wanting some questions answered. Pt is wanting to eat and wants to know when she can eat? Also pt has a drain tube in and is wanting to know when that will be removed. Please call and advise pt or husband Teacher, adult education([name]).

## 2013-07-20 NOTE — Patient Instructions (Signed)
Name_______________________________________Printed:____________________  Date and time of surgery_8/15 1230_______________________Arrival Time:_1100 masc_______________   1. Do not eat or drink anything after 12 midnight (or____hours) prior to surgery. This includes no water, chewing gum or mints.   2. Take the following pills with a small sip of water on the morning of surgery_inhaler pain med if needed____________________________________________________   3. Aspirin, Ibuprofen, Advil, Naproxen, Vitamin E and other Anti-inflammatory products should be stopped for 5 days before surgery or as directed by your physician.   4. Check with your Doctor regarding stopping Plavix, Coumadin,Eliquis, Lovenox,Effient,Pradaxa,Xarelto, Fragmin or other blood thinners and follow their instructions.   5. Do not smoke, and do not drink any alcoholic beverages 24 hours prior to surgery.  This includes NA Beer.   6. You may brush your teeth and gargle the morning of surgery.  DO NOT SWALLOW WATER   7. You MUST make arrangements for a responsible adult to take you home after your surgery. You will not be allowed to leave alone or drive yourself home.  It is strongly suggested someone stay with you the first 24 hrs. Your surgery will be cancelled if you do not have a ride home.   8. A parent/legal guardian must accompany a child scheduled for surgery and plan to stay at the hospital until the child is discharged.  Please do not bring other children with you.   9. Please wear simple, loose fitting clothing to the hospital.  Do not bring valuables (money, credit cards, checkbooks, etc.) Do not wear any makeup (including no eye makeup) or nail polish on your fingers or toes.             10. DO NOT wear any jewelry or piercings on day of surgery.  All body piercing jewelry must be removed.             11. If you have ___dentures, they will be removed before going to the OR; we will provide you a container.  If you wear ___contact lenses or  ___glasses, they will be removed; please bring a case for them.             12. Please see your family doctor/pediatrician for a history & physical and/or concerning medications.  Bring any test results/reports from your physician's office.   PCP__________________Phone___________H&P Appt. Date________             13 If you  have a Living Will and Durable Power of Attorney for Healthcare, please bring in a copy.             14. Notify your Surgeon if you develop any illness between now and surgery  time, cough, cold, fever, sore throat, nausea, vomiting, etc.  Please notify your surgeon if you experience dizziness, shortness of breath or blurred vision between now & the time of your surgery             15. DO NOT shave your operative site 96 hours prior to surgery. For face & neck surgery, men may use an electric razor 48 hours prior to surgery.             16. Shower the night before surgery with ___Antibacterial soap ___Hibiclens             17. To provide excellent care visitors will be limited to one in the room at any given time.             18.  Please bring picture ID and insurance  card.             19.  Visit our web site for additional information:  [email or web address]              20.During flu season no children under the age of 75 are permitted in the hospital for the safety of all patients.                              21. If you take a long acting insulin in the evening only  take half of your usual  dose the night  before your procedure              22. If you use a c-pap please bring DOS if staying overnight,             23.For your convenience [facility] has a pharmacy on site to fill your prescriptions.             24. If you use oxygen and have a portable tank please bring it  with you the DOS             25. Other ____________________________________________     *Please call pre admission testing if you any further questions   Dareen Piano         [phone]   [place] [phone]   Vickii Chafe             [phone]    [place]  [phone]   [facility]   [phone]       All above information reviewed with patient in person or by phone.Patient verbalizes understanding.All questions and concerns addressed.                                                                                                 Patient/Rep_per phone w/husband___________________                                                                                                                                    PRE OP INSTRUCTIONS

## 2013-07-20 NOTE — Telephone Encounter (Signed)
Dr. Suzi Roots, Can you answer these questions or call the patient back to discuss since you told her not to come in today.  Thanks.  Thurston Hole

## 2013-07-21 ENCOUNTER — Encounter

## 2013-07-21 MED ORDER — HYDROMORPHONE HCL PF 2 MG/ML IJ SOLN
2 | INTRAMUSCULAR | Status: DC | PRN
Start: 2013-07-21 — End: 2013-07-22

## 2013-07-21 MED ORDER — LIDOCAINE HCL (PF) 1 % IJ SOLN
1 | Freq: Once | INTRAMUSCULAR | Status: AC | PRN
Start: 2013-07-21 — End: 2013-07-21

## 2013-07-21 MED ORDER — OMEPRAZOLE 40 MG PO CPDR
40 MG | ORAL_CAPSULE | Freq: Every day | ORAL | Status: DC
Start: 2013-07-21 — End: 2016-04-09

## 2013-07-21 MED ORDER — OXYCODONE-ACETAMINOPHEN 5-325 MG PO TABS
5-325 MG | ORAL_TABLET | Freq: Four times a day (QID) | ORAL | Status: DC | PRN
Start: 2013-07-21 — End: 2013-07-30

## 2013-07-21 MED ORDER — METOCLOPRAMIDE HCL 5 MG/ML IJ SOLN
5 | Freq: Once | INTRAMUSCULAR | Status: AC | PRN
Start: 2013-07-21 — End: 2013-07-21

## 2013-07-21 MED ORDER — PROMETHAZINE HCL 25 MG PO TABS
25 MG | ORAL_TABLET | Freq: Four times a day (QID) | ORAL | Status: DC | PRN
Start: 2013-07-21 — End: 2013-07-30

## 2013-07-21 MED ADMIN — ondansetron (ZOFRAN) 4 MG/2ML injection: INTRAVENOUS | @ 20:00:00 | NDC 23155037831

## 2013-07-21 MED ADMIN — metoclopramide (REGLAN) injection 10 mg: INTRAVENOUS | @ 17:00:00 | NDC 00703450201

## 2013-07-21 MED ADMIN — pantoprazole (PROTONIX) injection 40 mg: INTRAVENOUS | @ 22:00:00 | NDC 00008400101

## 2013-07-21 MED ADMIN — promethazine (PHENERGAN) injection 12.5 mg: INTRAVENOUS | @ 22:00:00 | NDC 00641092821

## 2013-07-21 MED ADMIN — scopolamine (TRANSDERM-SCOP) 1.5 MG 1 patch: TRANSDERMAL | @ 16:00:00 | NDC 10019055388

## 2013-07-21 MED ADMIN — promethazine (PHENERGAN) injection 12.5 mg: INTRAVENOUS | @ 21:00:00 | NDC 00641092821

## 2013-07-21 MED ADMIN — metoclopramide (REGLAN) injection 10 mg: INTRAVENOUS | NDC 00703450201

## 2013-07-21 MED ADMIN — lactated ringers infusion: INTRAVENOUS | @ 16:00:00 | NDC 00338011704

## 2013-07-21 MED ADMIN — promethazine (PHENERGAN) 25 MG/ML injection: INTRAVENOUS | @ 21:00:00 | NDC 00641092821

## 2013-07-21 MED ADMIN — HYDROmorphone HCl PF (DILAUDID) injection 0.5 mg: 0.5 mg | INTRAVENOUS | @ 18:00:00 | NDC 00409131230

## 2013-07-21 MED FILL — NORMAL SALINE FLUSH 0.9 % IV SOLN: 0.9 % | INTRAVENOUS | Qty: 20

## 2013-07-21 MED FILL — HYDROMORPHONE HCL PF 2 MG/ML IJ SOLN: 2 MG/ML | INTRAMUSCULAR | Qty: 1

## 2013-07-21 MED FILL — ONDANSETRON HCL 4 MG/2ML IJ SOLN: 4 MG/2ML | INTRAMUSCULAR | Qty: 2

## 2013-07-21 MED FILL — METOCLOPRAMIDE HCL 5 MG/ML IJ SOLN: 5 MG/ML | INTRAMUSCULAR | Qty: 2

## 2013-07-21 MED FILL — TRANSDERM-SCOP (1.5 MG) 1 MG/3DAYS TD PT72: 1 MG/3DAYS | TRANSDERMAL | Qty: 1

## 2013-07-21 MED FILL — LACTATED RINGERS IV SOLN: INTRAVENOUS | Qty: 1000

## 2013-07-21 MED FILL — NORMAL SALINE FLUSH 0.9 % IV SOLN: 0.9 % | INTRAVENOUS | Qty: 10

## 2013-07-21 MED FILL — PROMETHAZINE HCL 25 MG/ML IJ SOLN: 25 MG/ML | INTRAMUSCULAR | Qty: 1

## 2013-07-21 MED FILL — PROTONIX 40 MG IV SOLR: 40 MG | INTRAVENOUS | Qty: 40

## 2013-07-21 NOTE — Progress Notes (Signed)
Scope number verified for HLD via label printout prior to use. H and P and consent completed. Verified using 2 person system. Update completed prior to procedure Family in waiting room.

## 2013-07-21 NOTE — Anesthesia Pre-Procedure Evaluation (Signed)
Rachael Mays     Anesthesia Evaluation     Patient summary reviewed and Nursing notes reviewed    Airway   Mallampati: II  TM distance: >3 FB  Neck ROM: full  Dental - normal exam     Pulmonary - normal exam   (+) asthma,   ROS comment: Hx of tobagism 17.5 pk yr hx  Cardiovascular - normal exam  (+) hypertension,     NYHA Classification: II  ECG reviewed  Beta Blocker:  Not on Beta Blocker    Neuro/Psych    (+) psychiatric history  GI/Hepatic/Renal    (+) GERD,     Endo/Other - negative ROS   Abdominal  - normal exam                  Allergies: Nystatin and Talwin    NPO Status:                              Anesthesia Plan    ASA 3     general   (GEN OETA)  intravenous induction   Anesthetic plan and risks discussed with patient.  Use of blood products discussed with whom consented to blood products.   Plan discussed with CRNA.          Rachael Mays  07/21/2013

## 2013-07-21 NOTE — Progress Notes (Signed)
Admitted to pacu from endo per cart awakening. Respirations normal & symmetrical. O2 per nc at 4 lpm. Abdomen rounded & soft. JP patent with dsg to site dry & intact. Dsg left abdomen dry & intact. Denies pain. C/O nausea. Fall risk assessment done. Bed in low position.

## 2013-07-21 NOTE — Progress Notes (Signed)
Patient more awake, less nauseated. Patient transitioned to phase II level of care.

## 2013-07-21 NOTE — Progress Notes (Signed)
Dr Daphine DeutscherMartin here to see pt. Will return to endo to remove stent.

## 2013-07-21 NOTE — Progress Notes (Signed)
Dr. Daphine DeutscherMartin here. Returned to endoscopy for procedure.

## 2013-07-21 NOTE — Progress Notes (Signed)
Patient resting with her eyes closed, doesn't exhibit any signs of nausea or vomiting at this time. Patient's husband out to dinner, will pursue sending patient home when he returns.

## 2013-07-21 NOTE — H&P (Signed)
[facility]   Pre-operative History and Physical    Patient: Rachael Mays  DOB: 05/20/1956  Acct#: [id number]    History Obtained From:  patient, electronic medical record    HISTORY OF PRESENT ILLNESS:    The patient is a 57 y.o. female with significant past medical history of GERD s/p recent removal of lap band who presents with persisting leak from GE junction for esophageal stenting  Past Medical History:        Diagnosis Date   ??? Anxiety    ??? Asthma    ??? Chronic back pain    ??? Deep vein thrombosis    ??? Depression    ??? GERD (gastroesophageal reflux disease)      NO LONGER SINCE LAP   ??? Pulmonary embolism (HCC)    ??? Obesity      hx of; had lap band   ??? Hyperlipidemia      hx; resolved with lap band   ??? GERD (gastroesophageal reflux disease) 01/23/2009   ??? Hypertension      Past Surgical History:        Procedure Laterality Date   ??? Colonoscopy     ??? Upper gastrointestinal endoscopy     ??? Cesarean section     ??? Cholecystectomy     ??? Hysterectomy     ??? Shoulder surgery     ??? Lap band  05/01/08     Dr. Suzi Roots   ??? Back surgery       neck  plates   ??? Colonoscopy  04/08/10   ??? Spine surgery     ??? Other surgical history       [place] Filter: curently in place   ??? Upper gastrointestinal endoscopy  05/19/13   ??? Other surgical history  07/05/13     lap band removal     Medications Prior to Admission:   Current Outpatient Prescriptions on File Prior to Encounter   Medication Sig Dispense Refill   ??? NONFORMULARY TPN overnight per picc line       ??? enoxaparin (LOVENOX) 30 MG/0.3ML injection Inject 0.4 mLs into the skin daily.  40 mL  0   ??? oxyCODONE-acetaminophen (PERCOCET) 5-325 MG per tablet Take 1 tablet by mouth every 4 hours as needed for Pain for up to 7 days.  30 tablet  0   ??? ondansetron (ZOFRAN) 4 MG tablet Take 1 tablet by mouth every 8 hours as needed for Nausea or Vomiting.  30 tablet  0   ??? amoxicillin-clavulanate (AUGMENTIN) 875-125 MG per tablet Take 1 tablet by mouth 2 times daily for 10 days.   20 tablet  0   ??? potassium chloride (KAYCIEL) 20 MEQ/15ML (10%) solution Take 30 mLs by mouth 2 times daily.  600 mL  0   ??? hydrochlorothiazide (HYDRODIURIL) 25 MG tablet Take 1 tablet by mouth daily.  90 tablet  1   ??? ranitidine (ZANTAC) 150 MG tablet Take 150 mg by mouth 2 times daily.       ??? montelukast (SINGULAIR) 10 MG tablet Take 1 tablet by mouth daily.  30 tablet  3   ??? fluocinonide (LIDEX) 0.05 % cream Apply topically 2 times daily.  1 Tube  1   ??? Wound Dressings (INTERDRY AG TEXTILE 10"X12') MISC Apply 1 each topically daily. Pt would like a call with price before filled  1 each  1   ??? gabapentin (NEURONTIN) 300 MG capsule TAKE ONE CAPSULE BY  MOUTH NIGHTLY  90 capsule  0   ??? cetirizine (ZYRTEC ALLERGY) 10 MG tablet Take 10 mg by mouth daily.       ??? ALPRAZolam (XANAX) 0.5 MG tablet Take 1 tablet by mouth 2 times daily as needed for Sleep or Anxiety.  60 tablet  0   ??? escitalopram (LEXAPRO) 20 MG tablet Take 20 mg by mouth nightly.       ??? calcium carbonate 600 MG TABS tablet Take 1 tablet by mouth 2 times daily.         ??? albuterol (PROVENTIL;VENTOLIN) 90 MCG/ACT inhaler Inhale 2 puffs into the lungs every 6 hours as needed.         No current facility-administered medications on file prior to encounter.     Allergies:  Nystatin and Talwin    History of allergic reaction to anesthesia:  No    Social History:   TOBACCO:   reports that she quit smoking about a year ago. Her smoking use included Cigarettes. She has a 17.5 pack-year smoking history. She has never used smokeless tobacco.  ETOH:   reports that she does not drink alcohol.  DRUGS:   reports that she does not use illicit drugs.  Family History:       Problem Relation Age of Onset   ??? Arthritis Mother    ??? Cancer Mother    ??? Depression Mother    ??? Heart Disease Mother    ??? High Blood Pressure Mother    ??? Heart Disease Maternal Grandmother    ??? Early Death Paternal Grandmother    ??? Heart Disease Paternal Grandfather    ??? Diabetes Other    ??? High  Blood Pressure Other    ??? Obesity Other    ??? Heart Disease Mother 85     MI    ??? Ovarian Cancer Mother 89   ??? Cancer Father 56     colon       PHYSICAL EXAM:      BP 133/73   Pulse 71   Temp(Src) 98.2 ??F (36.8 ??C) (Temporal)   Resp 16   SpO2 97% I        Heart:  Normal apical impulse, regular rate and rhythm, normal S1 and S2, no S3 or S4, and no murmur noted    Lungs:  No increased work of breathing, good air exchange, clear to auscultation bilaterally, no crackles or wheezing    Abdomen:  Normal bowel sounds, soft, non-distended, non-tender, no masses palpated, no hepatosplenomegally      ASA Grade:  ASA 3 - Patient with moderate systemic disease with functional limitations    Mallampati Class:  Class I: Soft palate, uvula, fauces, pillars visible  __________  Class II: Soft palate, uvula, fauces visible  ____X_____   Class III: Soft palate, base of uvula visible  __________  Class IV: Hard palate only visible   __________      ASSESSMENT AND PLAN:    1.  Patient is a 57 y.o. female here for EGD with esophageal stent placement under anesthesia.  2.  Procedure options, risks and benefits reviewed with patient.  Patient expresses understanding.     Kandice Robinsons, MD  [facility]  07/21/2013

## 2013-07-21 NOTE — Progress Notes (Signed)
Teaching/ education completed for home care including pain management, wound care and infection control. Patient verbalized understanding.

## 2013-07-21 NOTE — Progress Notes (Signed)
Dr Martin here to see pt.

## 2013-07-21 NOTE — Progress Notes (Signed)
Admitted to pacu from endo per cart awakening. O2 per mask at 4 lpm. BBS clear. Abdomen soft. Denies pain. Fall risk assessment done. Bed in low position.

## 2013-07-21 NOTE — Progress Notes (Signed)
Patient up to bathroom with assist, passed lots of gas and a small amount of loose brown stool. No further "retching" although patient cont with severe nausea.

## 2013-07-21 NOTE — Progress Notes (Signed)
Dr Daphine Deutscher stated that he reviewed the patient's xray and it "looks great". Patient still supposed to be discharged to home as soon as nausea settles down

## 2013-07-21 NOTE — Op Note (Signed)
[facility]  Endoscopy Note    Patient: Rachael Mays  DOB: 11-15-1956  Acct#: [id number]    Procedure: Esophagogastroduodenoscopy with replacement of esophageal stent    Date:  07/21/2013     Surgeon:  Dollene Primrose, MD    Referring Physician:   Zetta Bills    Preoperative Diagnosis:   1) Migrated esophageal stent  2) Gastric fistula    Postoperative Diagnosis:   Same    Anesthesia:  General per ETT.    Indications: This is a 57 y.o. year old female who presents today with gastric fistula treated earlier today with esophageal stent placement.  Here to remove covered stent and replace with partially covered stent.    Description of Procedure:  Informed consent was obtained from the patient after explanation of indications, benefits and possible risks and complications of the procedure.  The patient was then taken to the endoscopy suite, placed in the left lateral decubitus position and the above IV sedation was administrered.    The Olympus videoendoscope was placed in the patient's mouth and under direct visualization passed into the esophagus.  Visualization of the esophagus demonstrated normal mucosa.   The previously placed stent was at the GE junction and impacted against the contralateral gastric wall.  The proximal edge of the migrated esophageal stent was grasped with a grasping forceps and removed by withdrawing the scope.    The Olympus endoscope was reintroduced through the mouth into the esophagus.  The esophageal mucosa appeared normal.  The scope was then advanced into the stomach.    The pylorus was patent and the scope was advanced into the duodenum.  Visualization of the duodenal bulb demonstrated normal mucosa.  The second portion of the duodenum demonstrated normal mucosa.    The scope was withdrawn into stomach.    Using fluoroscopy, the length of the distal esophagus and gastroesophageal (GE) junction was marked and confirmed endoscopically.  A new 23 mm width x 125 mm  length partially-covered expandable metal mesh (WallFlex Esophageal) stent was successfully deployed across the distal esophagus/GE junction into the proximal stomach, again under fluoroscopic guidance.  Proper placement was confirmed with endoscopy.   The proximal edge of the stent was "anchored" to the proximal esophagus by grasping the suture on the proximal edge of the stent with a hemoclip and deploying the clip agains the esophageal wall.    The esophagus and stomach were then decompressed and the scope was completely withdrawn.    The patient tolerated the procedure well and was taken to the post anesthesia care unit in good condition.    Impression:   Successful replacement of esophageal stent with partially-covered stent      Recommendations:  1) Clear liquid diet as tolerated.     2) Soft diet per discharge instructions when feeling better.     3) Omeprazole 40 mg daily.     4) Repeat EGD for stent removal in 2 - 4 weeks.      Dollene Primrose, MD  [facility]

## 2013-07-21 NOTE — Progress Notes (Signed)
Zofran 4mg  ivp for c/o nausea & emesis of clear liquids. Portable chest xray done.

## 2013-07-21 NOTE — Progress Notes (Signed)
Assisted to bathroom to attempt to have bm. Report to B Hursell RN.

## 2013-07-21 NOTE — Progress Notes (Signed)
Has been up to br to have a scant loose stool with flatus - also almost cont retching with small amount green tinged emesis - returned to cart - phenrgan 6.25 mg given iv per anesthesia order

## 2013-07-21 NOTE — Progress Notes (Signed)
Dozing - nausea cont but no recent retching - handoff report to Concepcion Elk RN

## 2013-07-21 NOTE — Progress Notes (Signed)
Portable chest xray done.

## 2013-07-21 NOTE — Progress Notes (Signed)
Dilaudid titrated for c/o increasing pain.

## 2013-07-21 NOTE — Progress Notes (Signed)
Dr Daphine DeutscherMartin here to speak with pt & husband. States pain relieved to level 7 which she states is tolerable. O2 d/c'ed.

## 2013-07-21 NOTE — Progress Notes (Signed)
Reglan 10mg iv for c/o nausea.

## 2013-07-21 NOTE — Op Note (Signed)
[facility]  Endoscopy Note    Patient: Rachael Mays  DOB: Nov 16, 1956  Acct#: [id number]    Procedure: Esophagogastroduodenoscopy with esophageal stent placement    Date:  07/21/2013     Surgeon:  Dollene Primrose, MD    Referring Physician:   Zetta Bills    Preoperative Diagnosis:   Gastric fistula    Postoperative Diagnosis:   Gastric fistula    Anesthesia:  General per ETT    Indications: This is a 57 y.o. year old female who presents today with gastric fistula s/p eroded lap band removal.      Description of Procedure:  Informed consent was obtained from the patient after explanation of indications, benefits and possible risks and complications of the procedure.  The patient was then taken to the endoscopy suite, placed in the left lateral decubitus position and the above IV sedation was administrered.    The Olympus videoendoscope was placed in the patient's mouth and under direct visualization passed into the esophagus.  Visualization of the esophagus demonstrated normal mucosa.     The scope was then advanced into the stomach.    Visualization of the gastric body and antrum demonstrated normal mucosa.  A retroflexed exam of the gastric cardia and fundus demonstrated a single "pinhole" defect 1 cm distal to GE junction in cardia perhaps c/w fistula opening.  The pylorus was patent and the scope was advanced into the duodenum.  Visualization of the duodenal bulb demonstrated normal mucosa.  The second portion of the duodenum demonstrated normal.    Using fluoroscopy, the length of the distal esophgus and gastroesophageal (GE) junction was marked and confirmed endoscopically.  A 23 mm width x 125 mm length fully-covered expandable metal mesh (WallFlex Esophageal) stent was successfully deployed across the distal esophagus and gastroesophageal junction into the proximal stomach again under fluoroscopic guidance.  Proper placement was confirmed with endoscopy.     The scope was then withdrawn  back into the stomach, it was decompressed, and the scope was completely withdrawn.    The patient tolerated the procedure well and was taken to the post anesthesia care unit in good condition.    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      Recommendations: 1) Continue present meds and TPN.     2) Reinstitution of diet per Dr. Suzi Roots.     3) When diet restarted, should be soft diet as per discharge instruction.     4) EGD in 8 weeks to remove stent -- would get UGI prior to stent removal.    Dollene Primrose, MD  [facility]

## 2013-07-21 NOTE — Discharge Instructions (Signed)
Call Dr. Daphine DeutscherMartin for any problems and to schedule followup appointment-[phone].  Clear liquids today & advance to soft diet tomorrow if ok.  CLEAR LIQUID DIET    Your doctor has prescribed a clear liquid diet for you. This temporary diet is often prescribed right before or after surgery or medical testing. The clear liquid diet is easy to digest and helps the body gradually get used to food again.    FOODS ON CLEAR LIQUID DIET INCLUDE:    Water  Fruit juices without pulp, such as filtered juices or pulp free lemonade  Fruit punch or fruit drinks without pulp or pieces  Hot or cold coffee or tea (do not add milk or creamers of any type)  Sodas such as lemon-lime, cola, ginger ale  Sports drinks  Clear soup- bouillion  Plain or flavored gelatin (do not add fruits or toppings)  Frozen juice bars made from clear juices (no fruit pieces)  Popsicles                    Nutrition Guidelines Following Esophageal Stent Placement    Why Do I Need This Diet  After your stent placement, this diet is necessary to make foods easy to swallow. The texture of your food needs to be altered to a moist/soft consistency so foods will go down your esophagus with ease.    General Guidelines  . Chew all food thoroughly.  . Eat 5-6 small meals per day if needed.  . Eat slowly and take small bites.  . Sit upright while eating.  . Drink fluids in between meals if you feel "full" with meals.  . Remain in an upright position at least 30-60 minutes after eating.  . Foods should always be prepared so that they are moist, soft, and easily swallowed.  . If food ever feels "stuck" in your throat take a couple sips of Coca-Cola (not Pepsi)* This may                                                     help dislodge food from your esophagus. You may want to repeat this throughout the day, especially before and after each meal.  . If you are having trouble maintaining your weight, you may need to drink nutritional supplements (see below) or homemade  milkshakes as snacks/meal replacements. If you need ideas, ask to  meet with the "GI" nutritionist.    Food Group    Yes Foods     Avoid  Milk and Dairy Products   Milk--all kinds    Ice cream or yogurt with chunks of  Yogurt, custard, ice cream         fruit or nuts  Soft or melted cheese  Cottage cheese, cream cheese    Meat and Meat Substitutes   Soft eggs    Dry poultry  Tofu     Peanut butter  Casseroles    All tough red and white meats  Moist Fish  Strained baby meats (for easy preparation)  All other meats must be bite-size or ground--  suggest adding a gravy or sauce.    Fruits     All juices     Fresh fruits with skins--plums,  All canned fruits         peaches, oranges, apricots  Fresh fruits peeled--bananas  Dried fruits  Stewed dried fruits.  Strained baby fruits    Vegetables    Well-cooked soft or pureed   Raw vegetables  Should be "fork-tender"  Strained baby vegetables    Bread and Starchy Foods  Cooked cereal     Hard bread with thick crust  Mashed potatoes, sweet potatoes/yams  Dry cereals without milk  Baked potato without skin    Potato chips  Soft, moist rice     Popcorn  Noodles, macaroni, spaghetti    Crackers  Dry cereals softened in milk  Pancakes softened with syrup/butter  Waffles softened with syrup/butter  Crackers or breads added to soups    Fats     Butter, margarine, mayonnaise  Bacon  Salad dressings    Nuts  Gravy     Deep fried, crispy food  Cream: sour, whipping, coffee    Desserts     [place], ice cream, [place] ice, frozen  Cookies  Gelatin, puddings, mousse, custard  Pie crust  All cake type desserts     Meal Ideas      Breakfast  . Cereal softened with whole milk, canned fruit. Orange juice to drink.  . Scrambled eggs made with cheese and butter. Coffee to drink (creamer and sugar).  Corrie Mck[place] made with whole milk, yogurt, and banana. Grape juice to drink.  . Pancakes or [place] toast with butter, syrup, or fruit sauce. Orange juice to drink.      Lunch  . Creamed or vegetable  soup, applesauce with cinnamon and sugar. Ice tea to drink.  . Egg salad/tuna salad on soft, crust-less, buttered bread, melon. Lemonade to drink.  . Ground beef/pork with gravy, rice, and creamed corn or cooked/soft carrots. Milk to drink.  . Pasta or potato salad, soup or stew, canned/soft fruit. Water to drink.      Dinner  . Ground chicken with gravy, mashed potatoes with butter/cheese, soft green beans. Milk to drink.  Jobie Quaker and cheese, "bite-sized" hot dog with ketchup/mustard, baked beans. Ice tea to drink.  Marland Kitchen Spaghetti with ground meat sauce, soft cooked vegetables with cheese sauce. Water to drink.  Brien Mates or omelet made with cheese, spinach, or other cooked vegetables. Lemonade to drink.            *Try drinking sips of Coke if food feels stuck!    ENDOSCOPY DISCHARGE INSTRUCTIONS     You may experience some lightheadedness for the next several hours.   Plan on quiet relaxation for the rest of today.   A responsible adult needs to stay with you today.   Because of the medications you received today-do not drive,operate machinery,or sign any contractual agreement for the next 24 hours.   Do not drink any alcoholic beverages or take any unprescribed medications tonight.   Diet restrictions as instructed.   If you have any of the following problems, notify your physician or return to the hospital emergency room : fever, chills, excessive bleeding, excessive vomiting, difficulty swallowing, uncontrolled pain, increased abdominal distention, shortness of breath or any other problems.   If you had a polyp removed, avoid strenuous activity for 48 hours.Avoid the use of aspirin or related compounds for one week, unless otherwise instructed by your physician.   If you have a sore throat, you may use lozenges or salt water gargles.    ANESTHESIA DISCHARGE INSTRUCTIONS     Wear your seatbelt home.   You are under the influence of drugs-do not drink alcohol,drive,operate machinery,or make  any  important decisions or sign any legal documentsfor 24 hours   A responsible adult needs to be with you for 24 hours.   You may experience lightheadedness,dizziness,or sleepiness following surgery.   Rest at home today- increase activity as tolerated.   Progress slowly to a regular diet unless your physician has instructed you otherwise.Drink plenty of water.   If nausea becomes a problem call your physician.   Coughing,sore throat,and muscle aches are other side effects of anesthesia,and should improve with time.   Do not drive,operate machinery while taking narcotics.

## 2013-07-21 NOTE — Progress Notes (Signed)
Patient back to bed, 12.5 mg phenergan repeated as ordered by Dr Daphine Deutscher. Dr Daphine Deutscher at patient's bedside, scripts given for pain med and phenergan for home.

## 2013-07-21 NOTE — Progress Notes (Signed)
Patient cont without emesis. Cont to wait for patient's husband to return.

## 2013-07-22 ENCOUNTER — Inpatient Hospital Stay
Admit: 2013-07-22 | Disposition: A | Discharge status: Home / Self Care | Source: Home / Self Care | Admitting: Family Medicine

## 2013-07-22 LAB — CBC WITH AUTO DIFFERENTIAL
Basophils %: 0.3 %
Basophils Absolute: 0 10*3/uL (ref 0.0–0.2)
Eosinophils %: 0 %
Eosinophils Absolute: 0 10*3/uL (ref 0.0–0.6)
Hematocrit: 39.3 % (ref 36.0–48.0)
Hemoglobin: 12.8 g/dL (ref 12.0–16.0)
Lymphocytes %: 5.7 %
Lymphocytes Absolute: 0.7 10*3/uL — ABNORMAL LOW (ref 1.0–5.1)
MCH: 27.5 pg (ref 26.0–34.0)
MCHC: 32.6 g/dL (ref 31.0–36.0)
MCV: 84.4 fL (ref 80.0–100.0)
MPV: 7.9 fL (ref 5.0–10.5)
Monocytes %: 6 %
Monocytes Absolute: 0.7 10*3/uL (ref 0.0–1.3)
Neutrophils %: 88 %
Neutrophils Absolute: 10.1 10*3/uL — ABNORMAL HIGH (ref 1.7–7.7)
Platelets: 407 10*3/uL (ref 135–450)
RBC: 4.66 M/uL (ref 4.00–5.20)
RDW: 14.2 % (ref 12.4–15.4)
WBC: 11.5 10*3/uL — ABNORMAL HIGH (ref 4.0–11.0)

## 2013-07-22 LAB — BASIC METABOLIC PANEL
BUN: 17 mg/dL (ref 7–20)
CO2: 20 mmol/L — ABNORMAL LOW (ref 21–32)
Calcium: 9.9 mg/dL (ref 8.3–10.6)
Chloride: 96 mmol/L — ABNORMAL LOW (ref 99–110)
Creatinine: 0.7 mg/dL (ref 0.6–1.1)
GFR African American: >60 (ref >60)
GFR Non-African American: >60 (ref >60)
Glucose: 270 mg/dL — ABNORMAL HIGH (ref 70–99)
Potassium: 3.8 mmol/L (ref 3.5–5.1)
Sodium: 135 mmol/L — ABNORMAL LOW (ref 136–145)

## 2013-07-22 LAB — URINALYSIS
Bilirubin Urine: NEGATIVE mg/dL
Glucose, Ur: NEGATIVE mg/dL
Ketones, Urine: NEGATIVE mg/dL
Leukocyte Esterase, Urine: NEGATIVE
Nitrite, Urine: NEGATIVE
Protein, UA: 100 mg/dL — AB
Specific Gravity, UA: 1.025 (ref 1.005–1.030)
Urobilinogen, Urine: 0.2 E.U./dL (ref <2.0)
pH, UA: 7 (ref 5.0–8.0)

## 2013-07-22 LAB — HEPATIC FUNCTION PANEL
ALT: 53 U/L — ABNORMAL HIGH (ref 10–40)
AST: 40 U/L — ABNORMAL HIGH (ref 15–37)
Albumin: 3.8 g/dL (ref 3.4–5.0)
Alkaline Phosphatase: 127 U/L (ref 40–129)
Bilirubin, Direct: 0.3 mg/dL (ref 0.0–0.3)
Bilirubin, Indirect: 0.2 mg/dL (ref 0.0–1.0)
Total Bilirubin: 0.5 mg/dL (ref 0.0–1.0)
Total Protein: 8.5 g/dL — ABNORMAL HIGH (ref 6.4–8.2)

## 2013-07-22 LAB — POCT GLUCOSE
POC Glucose: 115 mg/dl — ABNORMAL HIGH (ref 70–99)
POC Glucose: 120 mg/dl — ABNORMAL HIGH (ref 70–99)
POC Glucose: 178 mg/dl — ABNORMAL HIGH (ref 70–99)

## 2013-07-22 LAB — MICROSCOPIC URINALYSIS
RBC, UA: NONE SEEN /HPF (ref 0–2)
WBC, UA: NONE SEEN /HPF (ref 0–5)

## 2013-07-22 LAB — LIPASE: Lipase: 56 U/L (ref 13.0–60.0)

## 2013-07-22 MED ORDER — ENALAPRILAT 1.25 MG/ML IV INJ
1.25 | Freq: Four times a day (QID) | INTRAVENOUS | Status: DC | PRN
Start: 2013-07-22 — End: 2013-08-02
  Administered 2013-07-22 – 2013-07-23 (×4): via INTRAVENOUS

## 2013-07-22 MED ORDER — HYDROMORPHONE 0.5MG/0.5ML IJ SOLN
1 | Status: DC | PRN
Start: 2013-07-22 — End: 2013-08-02

## 2013-07-22 MED ORDER — ACETAMINOPHEN 325 MG PO TABS
325 | ORAL | Status: DC | PRN
Start: 2013-07-22 — End: 2013-08-02

## 2013-07-22 MED ADMIN — ondansetron (ZOFRAN) injection 4 mg: INTRAVENOUS | @ 11:00:00 | NDC 23155037831

## 2013-07-22 MED ADMIN — sodium chloride flush 0.9 % injection 10 mL: INTRAVENOUS | @ 13:00:00

## 2013-07-22 MED ADMIN — 0.9 % sodium chloride infusion: INTRAVENOUS | @ 21:00:00 | NDC 00338004904

## 2013-07-22 MED ADMIN — HYDROmorphone HCl PF (DILAUDID) injection SOLN 1 mg: INTRAVENOUS | @ 09:00:00 | NDC 00409128331

## 2013-07-22 MED ADMIN — ampicillin-sulbactam (UNASYN) 1.5 g in sodium chloride 0.9 % 50 mL IVPB: INTRAVENOUS | @ 21:00:00 | NDC 00264180031

## 2013-07-22 MED ADMIN — 0.9 % sodium chloride infusion: INTRAVENOUS | @ 12:00:00 | NDC 00338004904

## 2013-07-22 MED ADMIN — ondansetron (ZOFRAN) injection 4 mg: INTRAVENOUS | @ 19:00:00 | NDC 23155037831

## 2013-07-22 MED ADMIN — 0.9 % sodium chloride infusion: INTRAVENOUS | @ 11:00:00 | NDC 00338004904

## 2013-07-22 MED ADMIN — 0.9 % sodium chloride infusion: INTRAVENOUS | @ 07:00:00 | NDC 00338004904

## 2013-07-22 MED ADMIN — ondansetron (ZOFRAN) injection 4 mg: INTRAVENOUS | @ 07:00:00 | NDC 23155037831

## 2013-07-22 MED ADMIN — sodium chloride flush 0.9 % injection 10 mL: INTRAVENOUS | @ 09:00:00

## 2013-07-22 MED ADMIN — famotidine premixed 20mg in NS 50mL ivpb: INTRAVENOUS | @ 14:00:00 | NDC 00338519741

## 2013-07-22 MED ADMIN — HYDROmorphone HCl PF (DILAUDID) injection SOLN 1 mg: INTRAVENOUS | @ 07:00:00 | NDC 00409128331

## 2013-07-22 MED ADMIN — promethazine (PHENERGAN) injection 12.5 mg: INTRAVENOUS | @ 21:00:00 | NDC 00641092821

## 2013-07-22 MED ADMIN — enoxaparin (LOVENOX) injection 40 mg: SUBCUTANEOUS | @ 13:00:00 | NDC 00075801301

## 2013-07-22 MED ADMIN — promethazine (PHENERGAN) 25 MG/ML injection: INTRAVENOUS | @ 09:00:00 | NDC 00641092821

## 2013-07-22 MED ADMIN — ioversol (OPTIRAY) 68 % injection 125 mL: INTRAVENOUS | @ 09:00:00 | NDC 00019132381

## 2013-07-22 MED ADMIN — promethazine (PHENERGAN) injection 12.5 mg: INTRAVENOUS | @ 16:00:00 | NDC 00641092821

## 2013-07-22 MED ADMIN — HYDROmorphone HCl PF (DILAUDID) injection SOLN 1 mg: INTRAVENOUS | @ 11:00:00 | NDC 00409128331

## 2013-07-22 MED ADMIN — PN-Adult Premix 5/20 - Standard Electrolytes - Central Line: INTRAVENOUS | @ 22:00:00 | NDC 00338112504

## 2013-07-22 MED ADMIN — ondansetron (ZOFRAN) injection 4 mg: INTRAVENOUS | @ 12:00:00 | NDC 23155037831

## 2013-07-22 MED ADMIN — ampicillin-sulbactam (UNASYN) 1.5 g in sodium chloride 0.9 % 50 mL IVPB: INTRAVENOUS | @ 13:00:00 | NDC 00264180031

## 2013-07-22 MED ADMIN — promethazine (PHENERGAN) injection 12.5 mg: INTRAVENOUS | @ 09:00:00 | NDC 00641149535

## 2013-07-22 MED ADMIN — HYDROmorphone HCl PF (DILAUDID) injection SOLN 1 mg: 1 mg | INTRAVENOUS | @ 13:00:00 | NDC 00409128331

## 2013-07-22 MED ADMIN — HYDROmorphone HCl PF (DILAUDID) injection SOLN 1 mg: 1 mg | INTRAVENOUS | @ 22:00:00 | NDC 00409128331

## 2013-07-22 MED ADMIN — HYDROmorphone HCl PF (DILAUDID) injection SOLN 1 mg: 1 mg | INTRAVENOUS | @ 16:00:00 | NDC 00409128331

## 2013-07-22 MED ADMIN — HYDROmorphone HCl PF (DILAUDID) injection SOLN 1 mg: 1 mg | INTRAVENOUS | @ 19:00:00 | NDC 00409128331

## 2013-07-22 MED FILL — ENALAPRILAT 1.25 MG/ML IV INJ: 1.25 MG/ML | INTRAVENOUS | Qty: 1

## 2013-07-22 MED FILL — UNASYN 1.5 (1-0.5) G IJ SOLR: 1.5 (1-0.5) g | INTRAMUSCULAR | Qty: 1.5

## 2013-07-22 MED FILL — ONDANSETRON HCL 4 MG/2ML IJ SOLN: 4 MG/2ML | INTRAMUSCULAR | Qty: 2

## 2013-07-22 MED FILL — HYDROMORPHONE HCL PF 1 MG/ML IJ SOLN: 1 MG/ML | INTRAMUSCULAR | Qty: 1

## 2013-07-22 MED FILL — NORMAL SALINE FLUSH 0.9 % IV SOLN: 0.9 % | INTRAVENOUS | Qty: 40

## 2013-07-22 MED FILL — FAMOTIDINE PREMIXED 20-0.9 MG/50ML-% IV SOLN: INTRAVENOUS | Qty: 1

## 2013-07-22 MED FILL — LOVENOX 30 MG/0.3ML SC SOLN: 30 MG/0.3ML | SUBCUTANEOUS | Qty: 0.6

## 2013-07-22 MED FILL — FENTANYL CITRATE 0.05 MG/ML IJ SOLN: 0.05 MG/ML | INTRAMUSCULAR | Qty: 5

## 2013-07-22 MED FILL — SODIUM CHLORIDE 0.9 % IV SOLN: 0.9 % | INTRAVENOUS | Qty: 1000

## 2013-07-22 MED FILL — CLINIMIX/DEXTROSE (5/20) 5 % IV SOLN: 5 % | INTRAVENOUS | Qty: 2000

## 2013-07-22 MED FILL — PROAIR HFA 108 (90 BASE) MCG/ACT IN AERS: 108 (90 Base) MCG/ACT | RESPIRATORY_TRACT | Qty: 8.5

## 2013-07-22 MED FILL — HUMALOG KWIKPEN 100 UNIT/ML SC SOPN: 100 UNIT/ML | SUBCUTANEOUS | Qty: 3

## 2013-07-22 MED FILL — CLINIMIX E/DEXTROSE (5/25) 5 % IV SOLN: 5 % | INTRAVENOUS | Qty: 2000

## 2013-07-22 MED FILL — NORMAL SALINE FLUSH 0.9 % IV SOLN: 0.9 % | INTRAVENOUS | Qty: 10

## 2013-07-22 MED FILL — PROMETHAZINE HCL 25 MG/ML IJ SOLN: 25 MG/ML | INTRAMUSCULAR | Qty: 1

## 2013-07-22 MED FILL — CLINIMIX E/DEXTROSE (5/20) 5 % IV SOLN: 5 % | INTRAVENOUS | Qty: 2000

## 2013-07-22 NOTE — ED Notes (Signed)
Pt states her nausea is not better, reports pain is the same, fluids infusing upon admission.      Rogue Bussing, RN  07/22/13 [phone]

## 2013-07-22 NOTE — ED Notes (Signed)
Pt medicated per MD order. No other needs expressed. Bed alarm in place and engaged. Bed in lowest position and locked. Call light in reach.      Seward Meth, RN  07/22/13 [phone]

## 2013-07-22 NOTE — Progress Notes (Signed)
Dressing to left lower abdomen changed, packed with iodiform, dressing to JP drain changed. Patient tolerated well.

## 2013-07-22 NOTE — Progress Notes (Signed)
PM assessment complete, see flowsheet. POC discussed with pt. Pt verbalizes understanding. Denies any needs at this time. Call light in reach, bed alarm on, will continue to monitor.

## 2013-07-22 NOTE — Progress Notes (Signed)
C/o nausea & pain, given phenergan & dilaudid, see MAR.

## 2013-07-22 NOTE — ED Notes (Signed)
Pt medicated per MD order. On O2 sensor. Bed alarm in place and engaged. Bed in lowest position and locked. Call light in reach.  Family at bedside.    Seward Meth, RN  07/22/13 [phone]

## 2013-07-22 NOTE — Consults (Signed)
Gastroenterology Consult Note      Patient: Rachael Mays  DOB: [birth year]  Acct#:  [id number]    Date:  07/22/2013    Subjective:       History of Present Illness  Patient is a 57 y.o. Caucasian female admitted with NAUSEA AND VOMITING who is seen in consult for same.  She is s/p lap band removal <2 weeks ago after band erosion into proximal stomach/distal esophagus noted.  Removal was complicated by a persistent small leak from proximal gastric cardia per UGI.  She was initially managed with NPO, home TPN, but repeat UGI showed persisting leak.  She underwent EGD with placement of an expandable metal mesh stent yesterday (07/21/2013) in hopes of occluding/covering fistula opening to allow spontaneous closure.  We placed a fully-covered stent initially.  She developed chest pain in the PACU and CXR demonstrated that the stent migrated into stomach.  The EGD was repeated, the fully-covered stent was removed, and a new "partially-covered" stent was placed across distal esophagus and gastroesophageal junction.  She experienced nausea thereafter, that was controlled with meds and was allowed to go home.  Overnight her nausea worsened and subxyphoid chest discomfort returned.  Her symptoms worsened despite po analgesics and antiemetics and she returned to ED.  She is admitted for symptom control.  Since admission, nausea and pain are improved with IV Meds.  She denies CP, SOB, fever or chills.        Past Medical History   Diagnosis Date   ??? Anxiety    ??? Asthma    ??? Chronic back pain    ??? Deep vein thrombosis    ??? Depression    ??? GERD (gastroesophageal reflux disease)      NO LONGER SINCE LAP   ??? Pulmonary embolism (HCC)    ??? Obesity      hx of; had lap band   ??? Hyperlipidemia      hx; resolved with lap band   ??? GERD (gastroesophageal reflux disease) 01/23/2009   ??? Hypertension       Past Surgical History   Procedure Laterality Date   ??? Colonoscopy     ??? Upper gastrointestinal endoscopy     ??? Cesarean  section     ??? Cholecystectomy     ??? Hysterectomy     ??? Shoulder surgery     ??? Lap band  05/01/08     Dr. Suzi Roots   ??? Back surgery       neck  plates   ??? Colonoscopy  04/08/10   ??? Spine surgery     ??? Other surgical history       [place] Filter: curently in place   ??? Upper gastrointestinal endoscopy  05/19/13   ??? Other surgical history  07/05/13     lap band removal   ??? Upper gastrointestinal endoscopy  07/21/13     ESOPHAGEAL STENT PLACEMENT   ??? Upper gastrointestinal endoscopy  07/21/13     WITH EXCHANGE OF ESOPHAGEAL STENT      Past Endoscopic History: See HPI.    Admission Meds  No current facility-administered medications on file prior to encounter.     Current Outpatient Prescriptions on File Prior to Encounter   Medication Sig Dispense Refill   ??? omeprazole (PRILOSEC) 40 MG capsule Take 1 capsule by mouth daily.  30 capsule  3   ??? oxyCODONE-acetaminophen (PERCOCET) 5-325 MG per tablet Take 1 tablet by mouth every 6 hours as needed for Pain.  20 tablet  0   ??? promethazine (PHENERGAN) 25 MG tablet Take 1 tablet by mouth every 6 hours as needed for Nausea for up to 7 days.  40 tablet  0   ??? NONFORMULARY TPN overnight per picc line       ??? enoxaparin (LOVENOX) 30 MG/0.3ML injection Inject 0.4 mLs into the skin daily.  40 mL  0   ??? ondansetron (ZOFRAN) 4 MG tablet Take 1 tablet by mouth every 8 hours as needed for Nausea or Vomiting.  30 tablet  0   ??? amoxicillin-clavulanate (AUGMENTIN) 875-125 MG per tablet Take 1 tablet by mouth 2 times daily for 10 days.  20 tablet  0   ??? potassium chloride (KAYCIEL) 20 MEQ/15ML (10%) solution Take 30 mLs by mouth 2 times daily.  600 mL  0   ??? hydrochlorothiazide (HYDRODIURIL) 25 MG tablet Take 1 tablet by mouth daily.  90 tablet  1   ??? ranitidine (ZANTAC) 150 MG tablet Take 150 mg by mouth 2 times daily.       ??? montelukast (SINGULAIR) 10 MG tablet Take 1 tablet by mouth daily.  30 tablet  3   ??? fluocinonide (LIDEX) 0.05 % cream Apply topically 2 times daily.  1 Tube  1   ??? Wound  Dressings (INTERDRY AG TEXTILE 10"X12') MISC Apply 1 each topically daily. Pt would like a call with price before filled  1 each  1   ??? gabapentin (NEURONTIN) 300 MG capsule TAKE ONE CAPSULE BY MOUTH NIGHTLY  90 capsule  0   ??? cetirizine (ZYRTEC ALLERGY) 10 MG tablet Take 10 mg by mouth daily.       ??? ALPRAZolam (XANAX) 0.5 MG tablet Take 1 tablet by mouth 2 times daily as needed for Sleep or Anxiety.  60 tablet  0   ??? escitalopram (LEXAPRO) 20 MG tablet Take 20 mg by mouth nightly.       ??? calcium carbonate 600 MG TABS tablet Take 1 tablet by mouth 2 times daily.         ??? albuterol (PROVENTIL;VENTOLIN) 90 MCG/ACT inhaler Inhale 2 puffs into the lungs every 6 hours as needed.           Patient denies ASA, NSAID use.    Allergies  Allergies   Allergen Reactions   ??? Nystatin Other (See Comments)     Yeast infection     ??? Talwin [Pentazocine] Other (See Comments)     dizzy      Social   History   Substance Use Topics   ??? Smoking status: Former Smoker -- 0.50 packs/day for 35 years     Types: Cigarettes     Quit date: 08/07/2012   ??? Smokeless tobacco: Never Used   ??? Alcohol Use: No        Family History   Problem Relation Age of Onset   ??? Arthritis Mother    ??? Cancer Mother    ??? Depression Mother    ??? High Blood Pressure Mother    ??? Heart Disease Maternal Grandmother    ??? Early Death Paternal Grandmother    ??? Heart Disease Paternal Grandfather    ??? Diabetes Other    ??? High Blood Pressure Other    ??? Obesity Other    ??? Heart Disease Mother 39     MI    ??? Ovarian Cancer Mother 37   ??? Cancer Father 17     colon   ??? Cancer  Mother       Review of Systems  Pertinent items are noted in HPI.       Physical Exam  Blood pressure 153/80, pulse 99, temperature 98.8 ??F (37.1 ??C), temperature source Oral, resp. rate 16, height 5\' 6"  (1.676 m), weight 220 lb (99.791 kg), SpO2 97 %, not currently breastfeeding.    General appearance: alert, cooperative, no distress, appears stated age  Eyes: Anicteric  Head: Normocephalic, without  obvious abnormality  Lungs: clear to auscultation bilaterally, Normal Effort  Heart: regular rate and rhythm, normal S1 and S2, no murmurs or rubs  Abdomen: soft, non-tender. Bowel sounds normal. No masses,  no organomegaly.  There is healing wound in LUQ and drain in epigastrium.  Extremities: atraumatic, no cyanosis or edema  Skin: warm and dry, no jaundice  Neuro: Grossly intact, A&OX3      Data Review:    Recent Labs      07/22/13   0300   WBC  11.5*   HGB  12.8   HCT  39.3   MCV  84.4   PLT  407     Recent Labs      07/22/13   0300   NA  135*   K  3.8   CL  96*   CO2  20*   BUN  17   CREATININE  0.7     Recent Labs      07/22/13   0300   AST  40*   ALT  53*   BILIDIR  0.3   BILITOT  0.5   ALKPHOS  127     Recent Labs      07/22/13   0300   LIPASE  56.0     No results found for this basename: PROTIME, INR,  in the last 72 hours  No results found for this basename: PTT,  in the last 72 hours  No results found for this basename: OCCULTBLD,  in the last 72 hours    Imaging Studies:                            CT-scan of abdomen and pelvis:  1. Esophagogastric stent placed, the stent in the expected location. Again, it is not clear whether the gastric defect will be sealed by this or not. 2. Continued evidence of small bubble of extraluminal gas noted adjacent to the gastric defect in the region of the surgical drain. 3. Mildly enlarged lymph node within the left periaortic space which presumably is reactive. This should be followed up to ensure stability or resolution. 4. Stable right adrenal adenoma.                   Assessment:     Principal Problem:    Nausea & vomiting  Active Problems:    Abdominal  pain, other specified site    Morbid obesity    Meniere syndrome    Complication of gastric banding    Wound infection (HCC)    Chest pain, Nausea and vomiting s/p Esophageal Stent placement - suspect result of stent in place +/- residual effects from 2 anesthesias yesterday.  Agree with admission for pain and  nausea control.  No apparent complication of stent on CT, so would leave in for 2 - 4 weeks in hopes of closing gastric fistula.  Would keep NPO or on clear liquids for now until she notes significant symptomatic improvement.  Thereafter, consider  advancing diet.  Would like to repeat UGI perhaps Monday to see of fistula persists with stent in place.    Recommendations:   1) Clear liquids ok from GI standpoint when nausea controlled.  2) IV Dilaudid, Zofran and Phenergan for symptom relief.  3) IV Protonix and then po PPI on discharge.  4) Consider UGI Monday to re-evaluate stent and fistula depending on her progress.    Thanks for consult.    Kandice Robinsons, MD  [facility]  07/22/2013

## 2013-07-22 NOTE — Progress Notes (Signed)
Admission complete, oriented to room & call light system. Very nauseas at this time. Given zofran for nausea & vasotec for elevated BP, see MAR. Friend at bedside. TPN running, patient started at home at 1130 & runs for 10 hours, will stop at 0930.

## 2013-07-22 NOTE — ED Notes (Signed)
Pt vomiting, Dr. Merrilyn Puma made aware, orders received for phenergan.    Toniann Ket, RN  07/22/13 [phone]

## 2013-07-22 NOTE — Progress Notes (Signed)
Given dilaudid for pain 8/10, see MAR. TPN infusing.

## 2013-07-22 NOTE — ED Notes (Signed)
Pt reports pain level 9/10 at this time.    Seward Meth, RN  07/22/13 [phone]

## 2013-07-22 NOTE — ED Notes (Signed)
Pt requested to stay in her gown and not a hospital gown.     Rogue Bussing, RN  07/22/13 [phone]

## 2013-07-22 NOTE — Consults (Signed)
Subjective    The patient is a 57 y.o. female being seen regarding inpatient consult for abdominal pain.  Rachael Mays is well known to [place] and had a band erosion and underwent lap band removal < 2 weeks ago.  Typically, these areas seal following removal.  However, she has a persistent, very small leak which is contained.  She has been stable on TPN, NPO and has a JP in place which is not draining.  She was readmitted yesterday following stent placement w/ n/v and pain.  She feels decreased pain today, but still having nausea.      Comorbid Conditions:  Significant diseases affecting this patient are   Past Medical History   Diagnosis Date   ??? Anxiety    ??? Asthma    ??? Chronic back pain    ??? Deep vein thrombosis    ??? Depression    ??? GERD (gastroesophageal reflux disease)      NO LONGER SINCE LAP   ??? Pulmonary embolism (HCC)    ??? Obesity      hx of; had lap band   ??? Hyperlipidemia      hx; resolved with lap band   ??? GERD (gastroesophageal reflux disease) 01/23/2009   ??? Hypertension    .    Review of Systems - Negative except per HPI.    Allergies:  Allergies   Allergen Reactions   ??? Nystatin Other (See Comments)     Yeast infection     ??? Talwin [Pentazocine] Other (See Comments)     dizzy       Past Surgical History:  Past Surgical History   Procedure Laterality Date   ??? Colonoscopy     ??? Upper gastrointestinal endoscopy     ??? Cesarean section     ??? Cholecystectomy     ??? Hysterectomy     ??? Shoulder surgery     ??? Lap band  05/01/08     Dr. Suzi Roots   ??? Back surgery       neck  plates   ??? Colonoscopy  04/08/10   ??? Spine surgery     ??? Other surgical history       [place] Filter: curently in place   ??? Upper gastrointestinal endoscopy  05/19/13   ??? Other surgical history  07/05/13     lap band removal   ??? Upper gastrointestinal endoscopy  07/21/13     ESOPHAGEAL STENT PLACEMENT   ??? Upper gastrointestinal endoscopy  07/21/13     WITH EXCHANGE OF ESOPHAGEAL STENT       Family History:  Family History   Problem Relation Age of Onset    ??? Arthritis Mother    ??? Cancer Mother    ??? Depression Mother    ??? High Blood Pressure Mother    ??? Heart Disease Maternal Grandmother    ??? Early Death Paternal Grandmother    ??? Heart Disease Paternal Grandfather    ??? Diabetes Other    ??? High Blood Pressure Other    ??? Obesity Other    ??? Heart Disease Mother 52     MI    ??? Ovarian Cancer Mother 4   ??? Cancer Father 27     colon   ??? Cancer Mother        Social History:  History     Social History   ??? Marital Status: Married     Spouse Name: N/A     Number of Children:  N/A   ??? Years of Education: N/A     Occupational History   ??? Not on file.     Social History Main Topics   ??? Smoking status: Former Smoker -- 0.50 packs/day for 35 years     Types: Cigarettes     Quit date: 08/07/2012   ??? Smokeless tobacco: Never Used   ??? Alcohol Use: No   ??? Drug Use: No   ??? Sexual Activity: Not on file     Other Topics Concern   ??? Not on file     Social History Narrative    ** Merged History Encounter **          Objective     Physical Exam   Constitutional:  Vital signs are normal. The patient appears well-developed and well-nourished.   HENT:   Head: Normocephalic.   Neck: No mass and no thyromegaly present.   Cardiovascular: Normal rate, regular rhythm, S1 normal and S2 normal.    Pulmonary/Chest: Effort normal and breath sounds normal.   Abdominal: Soft. Normal appearance. There is no organomegaly. No tenderness. There is no rigidity, no rebound, no guarding and no Murphy's sign.   Musculoskeletal:        Right lower leg: Normal. No tenderness and no edema.        Left lower leg: Normal. No tenderness and no edema.   Lymphadenopathy:     No cervical adenopathy, No Exrtemity Adenopathy.   Neurological: The patient is alert and oriented.   Skin: Skin is warm, dry and intact.   Psychiatric: The patient has a normal mood and affect. Speech is normal and behavior is normal. Judgment and thought content normal. Cognition and memory are normal.     Assessment    Patient Active Problem List    Diagnosis   ??? Back Pain   ??? Deep Vein Thrombosis   ??? Depression   ??? High Cholesterol   ??? Hypertension   ??? Pulmonary Embolism   ??? Nausea & vomiting   ??? Abdominal  pain, other specified site   ??? Morbid obesity   ??? Status post gastric banding   ??? Vertigo   ??? Dizziness   ??? Tinnitus, subjective   ??? Meniere syndrome   ??? Complication of gastric banding   ??? Wound infection (HCC)       Plan  S/p stent.  Stent looks ok per Dr. Daphine Deutscher.  There is frequently some pain and nausea associated with stent placement.  Hopefully would decrease with some time.  She very much needs to keep the stent in as this should allow the small area to heal.   Increase ambulation/ IS use  Would continue TPN  Will follow

## 2013-07-22 NOTE — Progress Notes (Signed)
Pt up in halls ambulating with husband, tolerating OK.

## 2013-07-22 NOTE — ED Provider Notes (Signed)
HPI  57 year old female presents with complaint of chest and abdominal pain and nausea and vomiting.  Patient states that 2 weeks ago she had a LAP-BAND removed by Dr. Dwain Sarna secondary to erosion into the esophagus and stomach.  She states she had perforation at that time.  Patient states that today she had esophageal stent placed by Dr. Daphine Deutscher.  After the procedure she had increased chest pain.  He took her back removed that stent in place a 2nd.  Patient was doing well after the 2nd procedure until she went home and has been nauseous and vomiting since that time.  Patient has a PICC line which she is receiving TPN through.  She has a abdominal wall JP drain and an area of abdominal wall packing is being monitored by Dr. Dwain Sarna.  Patient denies fevers or chills.  She states she just feels terrible.  She states she wants the esophageal stent out.    Review of Systems  Remainder of ROS reviewed and Negative except as noted above in the HPI.          Nursing notes reviewed and I agree with above.    Physical Exam  BP 171/86   Pulse 102   Temp(Src) 98 ??F (36.7 ??C) (Axillary)   Resp 18   Ht 5\' 6"  (1.676 m)   Wt 220 lb (99.791 kg)   BMI 35.53 kg/m2   SpO2 98%  Patient appears in moderate distress secondary to pain, age appropriate.  Interacts with the examiner in a normal age appropriate fashion.  Pupils round reactive to light, 3mm bilaterally nonicteric.  Clear speech, tacky mucous membranes, tongue midline. No elevation of floor of mouth.  No significant neck adenopathy, trachea midline, no obvious thyromegaly.  Neck supple, no signs of meningismus.  Lungs clear bilaterally. No wheezes, crackles, or rhonchi.  Heart tachycardic and regular rhythm. No murmurs, gallops, or rubs.  Abdomen soft, TTP in the epigastrium, JP drain in place in the mid abdomen with serous drainage in place, small incision in the left upper abdomen with packing, no surrounding erythema, serosanguinous drainage noted., nondistended. No  hepatosplenomegaly. No pulsatile masses.  Abdomen without rebound, guarding, or signs of peritonitis.  Nontender at Mcburney's point. No Murphy sign.  Skin without rashes or any other lesions.   Moves all 4 extremities purposefully. +2/4 DP and radial pulses.  NO C/C/E  No back tenderness.    Procedures    MDM  57 year old female presents with complaint of abdominal pain, chest pain nausea and vomiting.  Patient had stenting procedure of the esophagus yesterday.  Since she got home she has had intractable pain nausea and vomiting.    Patient with a benign abdominal exam.  I have low suspicion for ACS.  I do feel this is likely secondary to the procedure today.  Labs for evaluation shows slightly elevated white blood cell count.  She does have a slightly elevated but glucose and LFTs.  Chest x-ray shows no evidence of free air in the abdomen.    CT scan obtained to rule out microperforation or other acute abnormality.  Patient has had 3 rounds of pain and nausea medicine with only minimal improvement in her pain.  Her nausea has improved and she is no longer vomiting.  Given her symptoms I do feel she would benefit from admission for further evaluation treatment.  I spoke with Dr. Thora Lance, on-call for Dr. Mack Hook who will admit for further evaluation and treatment.  He is aware patient may  need GI and surgical consultation.    Impression:  Abdominal pain  Intractable nausea and vomiting    Labs  CBC with WBC at 11.5, ANC at 10.1  BMP with CO2 at 20, glucose at 270  LFT with ALT at 53, AST at 40  Lipase wnl    Radiology  The chest x-ray 1  Views  interpreted by me in the absence of a radiologist shows.  Normal Heart size.  Normal Mediastinum.  PICC line in place in the right atrium  Clear lungs bilaterally without evidence of focal infiltrate or failure.  No effusions.  Bony structures are unremarkable.  No pneumothorax identified.    EXAM: CT CHEST ABD PELVIS W CONTRAST - interpreted by [facility] radiology  HISTORY: chest  and abd pain  TECHNIQUE: Spiral CT scanning of the chest, abdomen and pelvis after the uneventful administration of IV contrast.  PRIORS: None.  FINDINGS:  Chest: The lungs show no discrete parenchymal mass, focal consolidation or pleural effusions. No apparent pneumothorax.  Small calcified granuloma in the right lung base.  Stent noted within the distal esophagus and extending into the stomach grossly intact and with low density material within  the lumen. No apparent aneurysm, pseudoaneurysm or aortic dissection. No apparent lymph node enlargement or axillary  adenopathy.  Abdomen: Percutaneous catheter tubing in the upper abdomen extending to the lesser curvature of the stomach. Illdefined  focus of soft tissue edema and emphysema noted in the left upper quadrant subcutaneous tissues, with possible  stent in and partially ring enhancing, low-density collection measuring approximately 2 x 1 cm, which may be postsurgical.  Gallbladder surgically absent with mild biliary duct prominence. Liver, spleen, pancreas, kidneys and adrenal glands are  within normal limits. IVC filter noted.  Pelvis: Visualized bowel grossly unremarkable. Appendix within normal limits. No significant free peritoneal fluid or  apparent adenopathy. Abdominal aorta is non-aneurysmal.  IMPRESSION:  1. Postsurgical changes, including esophageal stent and possible residual edema, emphysema and small seroma or  inflammatory collection in the left upper quadrant abdominal wall subcutaneous tissues.  2. No other acute findings.  Electronically Signed by: Wynetta Fines M.D.    EKG Interpretation.        Tonna Boehringer, MD  07/22/13 [phone]

## 2013-07-22 NOTE — Plan of Care (Signed)
Problem: Pain  Goal: Reduction in pain sensation  Outcome: Ongoing  Patient pain being managed. Reports decrease in pain. Respirations regular, easy, and unlabored.

## 2013-07-22 NOTE — Progress Notes (Addendum)
Clinical Pharmacy Note: TPN    Pharmacy consulted to dose TPN.  Will start standard central TPN Clinimix E 5/20   Will resume patient's cyclic rate at home:  Start at 6pm at 50ml x 1hr  Then 143ml/hr for 10 hours  Then 40ml/hr x1 hr    MVI and trace elements will be on Mon, Wed and Fri due to drug shortages.   Start lipids 8/18 1800 as patient has been on long-term PN. TG levels 6am 8/18. Do not give if TG > 400  Started q 4hr accuchecks with medium dose correction Humalog scale for coverage.  Daily renal function panel, Magnesium, and Phosphorus will be drawn while on TPN    Will monitor daily and adjust as needed.  Thank you for the consult,  Milas Hock PharmD

## 2013-07-22 NOTE — ED Notes (Signed)
Pt requesting more pain meds, Dr. Merrilyn Puma made aware, states will order additional pain meds.     Toniann Ket, RN  07/22/13 [phone]

## 2013-07-22 NOTE — Progress Notes (Signed)
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 rate 15, height 5\' 6"  (1.676 m), weight 220 lb (99.791 kg), SpO2 97 %, not currently breastfeeding.    obese,  alert and in moderate distress  clear to auscultation bilaterally  Cor RRR  normal bowel sounds, nontender and drain epigastrium and wound with small open area and packing left abdomen  Edema: No    Data  CBC:   Lab Results   Component Value Date    WBC 11.5 07/22/2013    RBC 4.66 07/22/2013    HGB 12.8 07/22/2013    HCT 39.3 07/22/2013    MCV 84.4 07/22/2013    MCH 27.5 07/22/2013    MCHC 32.6 07/22/2013    RDW 14.2 07/22/2013    PLT 407 07/22/2013    MPV 7.9 07/22/2013     BMP:    Lab Results   Component Value Date    NA 135 07/22/2013    K 3.8 07/22/2013    CL 96 07/22/2013    CO2 20 07/22/2013    BUN 17 07/22/2013    LABALBU 3.8 07/22/2013    CREATININE 0.7 07/22/2013    CREATININE 0.8 05/15/2011    CALCIUM 9.9 07/22/2013    GFRAA >60 07/22/2013    GFRAA >60 03/16/2012    LABGLOM >60 07/22/2013    GLUCOSE 270 07/22/2013         ASSESSMENT AND PLAN:    Patient Active Problem List   Diagnosis   ??? Back Pain   ??? Deep Vein Thrombosis   ??? Depression   ??? High Cholesterol   ??? Hypertension   ??? Pulmonary Embolism   ??? Nausea & vomiting   ??? Abdominal  pain, other specified site   ??? Morbid obesity   ??? Status post gastric banding   ??? Vertigo   ??? Dizziness   ??? Tinnitus, subjective   ??? Meniere syndrome   ??? Complication of gastric banding   ??? Wound infection (HCC)       ATB ordered IV due to  being unable to swallow, there is a suggestion of new infiltrate Left base.

## 2013-07-22 NOTE — H&P (Signed)
PATIENT NAME:                 PA #:            MR #Rachael Mays, Rachael Mays                 [id number]       [id number]            ATTENDING PHYSICIAN:                  ADM DATE:   DIS DATE:          Virl Cagey, MD               07/22/2013                     DATE OF BIRTH:   AGE:           PATIENT TYPE:     RM #:              1956/09/15       57             IPF               [id number]                  HISTORY OF PRESENT ILLNESS:  The patient presented to the emergency room with  abdominal pain, nausea, and vomiting.  A little over 2 weeks ago she had her  lap band removed because of erosion in the esophagus and the stomach.   Apparently there was a perforation at that time.  She has been unable to  swallow since that time and has been on nightly TPN.  Yesterday, she  apparently came in order to have an esophageal stent placed because of some  concerns about the patient having an erosion near the esophageal junction.   She states that on going home she had continued nausea and vomiting.  She has  also been unable to swallow anything by mouth and is spitting saliva.  She  also had uncontrolled pain, complaining of pain in the epigastric area.  The  patient had the lap band placed a long time ago and apparently was being  considered for other weight loss surgery when it was found that the lap band  has eroded and was no longer in the proper place.       PAST MEDICAL HISTORY:  She has had cervical disk surgery in the past.  She  has had routine colonoscopy done.  She apparently has a [place] filter in  place.  Her original lap band apparently was placed in 2009.  She has had  previous C-sections, previous hysterectomy.       ALLERGIES:  TALWIN CAUSES HALLUCINATIONS.     MEDICATIONS AT HOME:  Prior to admission:    1.  Omeprazole 40 mg a day.  2.  Percocet 1 every 6 hours as needed for pain.  3.  Phenergan 25 mg every 6 hours as needed.  4.  Lovenox 30 mg daily.  5.  Zofran 4 mg every 8 hours as needed for  vomiting.   6.  Augmentin 875/125 mg twice a day.  7.  Potassium chloride 10% solution, 30 mL twice a day.  8.  Singulair 10 mg a day.  9.  She  apparently has been off her Neurontin 300 mg at bedtime, which she  has taken for neuropathic pain.  She complains of numbness of both thumbs  from her previous cervical disk problems.    10.  She has been on Lexapro 20 mg a day and taking that with sips of water.      11.  She is also taking an albuterol inhaler as needed.       She reports having low-grade fever at home of about 100.  She has been  spitting up some white-colored sputum.  She is a former smoker having stopped  about a year ago.  She is a nondrinker.      FAMILY HISTORY:  Pertinent for her mother having diabetes and heart disease,  father having colon cancer.  Mother apparently also had ovarian cancer.   Mother died of an MI at age 42.  The patient reports no difficulty with  vision or hearing.  She has had dizziness.  She has been on  hydrochlorothiazide as an outpatient for her dizziness.  She states that she  has a history of DVT and pulmonary embolus a number of years ago when she was  on birth control pills.  She reports having diarrhea.  She has been using TPN  at night.  She was in the hospital for an extended time, just released  shortly.       PHYSICAL EXAMINATION:  GENERAL:  Alert middle-age female.  VITAL SIGNS:  Temperature 99.1, pulse 100, blood pressure 163/84,  respirations 15.  HEENT:  Sclerae are nonicteric.  Pupils are equal, round, and respond to  light.  Mouth and pharynx is normal.  She is spitting clear saliva.   LUNGS:  Clear; no rales are heard.  HEART:  Normal sinus rhythm.  ABDOMEN:  Appears soft.  She has a drain noted in the epigastric area and she  has a small open area of her incision in the left abdomen with some packing  in place; no significant surrounding erythema present.  The lower abdomen is  without any tenderness.    EXTREMITIES:  Full pulses, no edema.      LABORATORY  STUDIES:  Blood sugar 270; this is probably related to her TPN.   ALT is 53, AST 40, total protein 8.5, white count 11,500 with 88%  neutrophils.       CT of the chest was reported to be normal verbally.  Final report is still  pending.  Upper GI a couple days ago showed a small leak in the anterior  aspect of the cardia.      ASSESSMENT:  Pain, nausea, and vomiting, uncontrolled; history of leak to  gastroesophageal junction; previous erosion of lap band, obesity, Menieres  disease, family history of diabetes mellitus, possible pneumonia.      PLAN:  We will change her antibiotics to IV because of the possible  pneumonia, inability to swallow; consult with gastroenterology and Dr.  Dwain Sarna.  We will order her TPN continuously, provide pain medicine and  antiemetics.                                          Virl Cagey, MD     [id number]  DD: 07/22/2013 07:55   DT: 07/22/2013 10:32   Job #: [id number]  CC: Virl Cagey, MD

## 2013-07-22 NOTE — ED Notes (Signed)
Dr Thora Lance here to see pt.  Report called to 4T    Rogue Bussing, RN  07/22/13 [phone]

## 2013-07-22 NOTE — Progress Notes (Signed)
Given Vasotec for elevated BP, see MAR.

## 2013-07-22 NOTE — ED Notes (Signed)
Bed alarm in place and engaged. Bed in lowest position and locked. Call light in reach.      Seward Meth, RN  07/22/13 [phone]

## 2013-07-22 NOTE — Progress Notes (Signed)
Patient resting in bed, eyes closed. Call light within reach. No signs of acute distress. Respirations easy and unlabored.

## 2013-07-22 NOTE — Consults (Signed)
NUTRITION THERAPY ASSESSMENT     Admission date:07/22/2013      Timepoint: Initial-consult    Rachael Mays is a 57 y.o. female admitted d/t had esophageal stent placed yesterday presents with uncontrolled pain and nausea and vomiting, unable to swallow; She was recently admitted 7/30 for gastric band erosion requiring removal. She was started on PN during that admission. Has been on nightly TPN at home.     RD consult for PN. Currently NPO on PN @ 40 ml/hr.     OBJECTIVE DATA  Patient Active Problem List    Diagnosis Date Noted   ??? Wound infection (HCC) 07/22/2013   ??? Complication of gastric banding 07/05/2013   ??? 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 Vertigo 04/11/2013   ??? Dizziness 03/29/2013   ??? 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 Morbid obesity 02/21/2009   ??? Back Pain 01/23/2009   ??? Deep Vein Thrombosis 01/23/2009   ??? Depression 01/23/2009   ??? High Cholesterol 01/23/2009   ??? Hypertension 01/23/2009   ??? Pulmonary Embolism 01/23/2009   ??? Nausea & vomiting 01/23/2009   ??? Abdominal  pain, other specified site 01/23/2009     Past Medical History   Diagnosis Date   ??? Anxiety    ??? Asthma    ??? Chronic back pain    ??? Deep vein thrombosis    ??? Depression    ??? GERD (gastroesophageal reflux disease)      NO LONGER SINCE LAP   ??? Pulmonary embolism (HCC)    ??? Obesity      hx of; had lap band   ??? Hyperlipidemia      hx; resolved with lap band   ??? GERD (gastroesophageal reflux disease) 01/23/2009   ??? Hypertension        Labs  Recent Labs      07/22/13   0300   NA  135*   K  3.8   CL  96*   CO2  20*   BUN  17   CREATININE  0.7   GLUCOSE  270*     Lab Results   Component Value Date    PHOS 3.8 07/17/2013       Recent Labs      07/22/13   0300   AST  40*   ALT  53*   BILIDIR  0.3   BILITOT  0.5   ALKPHOS  127     Lab Results   Component Value Date    LABALBU 3.8 07/22/2013      Lab Results   Component Value Date    TRIG 231 07/17/2013    HDL 60 06/30/2013    HDL 52 03/31/2011    LDLCALC 138  06/30/2013    LABVLDL 19 06/30/2013     Recent Labs      07/22/13   0300   LIPASE  56.0     Lab Results   Component Value Date    LABA1C 5.2 03/26/2010       Medications  Continuous Medications:    ??? sodium chloride 100 mL/hr at 07/22/13 0818   ??? Adult Clinimix-E TPN - Central Line (Standard)       Scheduled Medications:   ??? enoxaparin  40 mg Subcutaneous Daily   ??? citalopram  40 mg Oral Daily   ??? gabapentin  300 mg Oral Nightly   ??? montelukast  10 mg Oral Daily   ??? sodium chloride  flush  10 mL Intravenous Q12H [facility]   ??? ampicillin-sulbactam  1.5 g Intravenous Q12H   ??? famotidine  20 mg Intravenous Q12H [facility]       Anthropometric Measures   Height: 5\' 6"  (167.6 cm)   Current Weight: Weight: 220 lb (99.791 kg)   Admission weight: 220 lb (99.791 kg)     If applicable: n/a  Usual Body Weight:  lb       Ideal Body Weight: 130 lb      % Ideal Body Weight: 169 %  Adjusted Body Weight: 69 lb    Body mass index is 35.53 kg/(m^2).   Classified as:  Less than 18.5 Underweight  18.5-24.9 Normal Weight  25-29.9 Overweight  30-34.9 Obese Class I  35-39.9 Obese Class II  Greater than or equal to 40 Obese Class III    Patient Vitals for the past 96 hrs (Last 3 readings):   Weight   07/22/13 0224 220 lb (99.791 kg)     Wt Readings from Last 50 Encounters:   07/22/13 220 lb (99.791 kg)   07/20/13 205 lb (92.987 kg)   07/09/13 236 lb (107.049 kg)   06/29/13 231 lb (104.781 kg)   06/12/13 225 lb (102.059 kg)   06/07/13 228 lb 8 oz (103.647 kg)   05/30/13 229 lb (103.874 kg)   05/22/13 230 lb 8 oz (104.554 kg)   05/19/13 231 lb 11.3 oz (105.1 kg)   05/18/13 225 lb (102.059 kg)   05/18/13 232 lb (105.235 kg)   04/27/13 229 lb 3.2 oz (103.964 kg)   04/17/13 230 lb (104.327 kg)   04/03/13 231 lb (104.781 kg)   03/29/13 228 lb 8 oz (103.647 kg)   03/10/13 227 lb 8 oz (103.193 kg)   02/23/13 227 lb 9.6 oz (103.239 kg)   12/28/12 217 lb (98.431 kg)   12/15/12 216 lb (97.977 kg)   10/20/12 213 lb (96.616 kg)   06/24/12 212 lb (96.163 kg)   06/02/12  210 lb (95.255 kg)   05/31/12 208 lb 3.2 oz (94.439 kg)   05/19/12 209 lb (94.802 kg)   04/14/12 211 lb (95.709 kg)   03/17/12 213 lb (96.616 kg)   03/16/12 214 lb (97.07 kg)   12/23/11 223 lb (101.152 kg)   11/14/11 220 lb (99.791 kg)   11/07/11 220 lb (99.791 kg)   11/03/11 226 lb (102.513 kg)   05/15/11 233 lb 12.8 oz (106.051 kg)   04/08/10 240 lb (108.863 kg)   04/01/10 240 lb (108.863 kg)   03/26/10 241 lb 2 oz (109.374 kg)   02/06/10 248 lb 3 oz (112.577 kg)   11/07/09 243 lb (110.224 kg)   10/23/09 235 lb (106.595 kg)   08/07/09 246 lb (111.585 kg)   05/02/09 242 lb 5 oz (109.912 kg)   02/21/09 245 lb (111.131 kg)   01/23/09 244 lb 4 oz (110.791 kg)   11/19/08 243 lb 9 oz (110.479 kg)       Comparative Standards   Weight Used: adjusted bodyweight 69 kg   Estimated Calorie Needs: 1725-2070 kcal (based on 25- 30 kcal/kg)  Estimated Protein Needs: 83-104g (based on 1.2- 1.5gm/kg)  Estimated Fluid Needs: 1725-2070 ml (based on 1 mL/kcal)    Nutrition-focused Physical Findings           Emesis:   none reported  Stool:      none reported  Skin/Edema:  Skin Integrity (WDL): Within Defined Limits (open incision on left side of  abdomen with packing)           Chewing / Swallowing:  Can't swallow s/p esoph. Stent placed 8/14.   Mental Status / Barriers:  no issues reported    Food/Nutrition-Related History  Pre-Admission / Home Diet:  Pre-Admission/Home Diet: NPO unknown  Home Supplements / Herbals:    none noted  Food Restrictions / Cultural Requests:    none noted  Allergies as of 07/22/2013 - Review Complete 07/22/2013   Allergen Reaction Noted   ??? Nystatin Other (See Comments) 03/29/2013   ??? Talwin [pentazocine] Other (See Comments) 03/29/2013       PO Diet Orders  Current diet order: Diet NPO Effective Now, Except for  Adult Clinimix-E TPN - Central Line (Standard)   Current supplement order:  none  Feeding Assistance:        Feeding:  (NPO)    Room Service: selective  Nursing Recorded PO Intake:   n/a  Patient /  Family Reported PO Intake: n/a  Observed PO Intake: n/a  Pain does not affect PO intake.    Tube Feeding Order / Parenteral Order  none    No intake or output data in the 24 hours ending 07/22/13 0843    Intake vs. Needs: intake less than needs     NUTRITION DIAGNOSIS and GOAL  Nutrition Deficit Risk.   ?? Problem: altered GI function  ?? Etiology/related to: NPO  ?? Symptoms/Signs/as evidenced by: on HPN; s/p esoph stent and diff swallowing; had lap band removal 2weeks ago.      Goal: patient will tolerate PN @ goal rate or diet adv to meet nutrition needs.   ?? Initiated: 08/16       ?? Progress: N/A       ?? Resolved: N/A    EDUCATION  n/a      MALNUTRITION  Per AND/ASPEN guidelines, patient does not meet criteria for malnutrition.    INTERVENTION HISTORY  08/16 initial assessment      NUTRITION RECOMMENDATIONS / PRESCRIPTION / MONITORING / EVALUATION  1. Consider PN @ goal rate 83 ml/hr continuous to meet 100% of needs. This provides 1755 kcal, 100 g protein and 2.76 dex load.   2. Please start lipids Mon 8/18 as patient has been on long-term PN. Obtain current TG levels. Do not give if TG > 400  3. If desire to transition PN to cyclic PM, recommend 50 ml/hr taper up/down first and last hour; 190 ml/hr x 10 hours.   4. Monitor for advancement of PN to goal rate and patient tolerance.     Pt is at high nutritional risk. Will follow up in 3-4 days to monitor PN advanced to goal rate to meet 100% of needs.     Vella Kohler RD, LD  Pager: [phone]

## 2013-07-22 NOTE — ED Notes (Signed)
Pt to CT via stretcher with CT tech.    Seward Meth, RN  07/22/13 [phone]

## 2013-07-23 LAB — POCT GLUCOSE
POC Glucose: 167 mg/dl — ABNORMAL HIGH (ref 70–99)
POC Glucose: 111 mg/dl — ABNORMAL HIGH (ref 70–99)
POC Glucose: 129 mg/dl — ABNORMAL HIGH (ref 70–99)
POC Glucose: 130 mg/dl — ABNORMAL HIGH (ref 70–99)
POC Glucose: 99 mg/dl (ref 70–99)

## 2013-07-23 LAB — CBC WITH AUTO DIFFERENTIAL
Basophils %: 0.5 %
Basophils Absolute: 0.1 10*3/uL (ref 0.0–0.2)
Eosinophils %: 0.8 %
Eosinophils Absolute: 0.1 10*3/uL (ref 0.0–0.6)
Hematocrit: 41.2 % (ref 36.0–48.0)
Hemoglobin: 13.3 g/dL (ref 12.0–16.0)
Lymphocytes %: 17.2 %
Lymphocytes Absolute: 2.2 10*3/uL (ref 1.0–5.1)
MCH: 27.8 pg (ref 26.0–34.0)
MCHC: 32.4 g/dL (ref 31.0–36.0)
MCV: 85.9 fL (ref 80.0–100.0)
MPV: 8.5 fL (ref 5.0–10.5)
Monocytes %: 9 %
Monocytes Absolute: 1.1 10*3/uL (ref 0.0–1.3)
Neutrophils %: 72.5 %
Neutrophils Absolute: 9.1 10*3/uL — ABNORMAL HIGH (ref 1.7–7.7)
Platelets: 363 10*3/uL (ref 135–450)
RBC: 4.8 M/uL (ref 4.00–5.20)
RDW: 13.8 % (ref 12.4–15.4)
WBC: 12.6 10*3/uL — ABNORMAL HIGH (ref 4.0–11.0)

## 2013-07-23 LAB — BASIC METABOLIC PANEL
BUN: 16 mg/dL (ref 7–20)
CO2: 26 mmol/L (ref 21–32)
Calcium: 9.4 mg/dL (ref 8.3–10.6)
Chloride: 101 mmol/L (ref 99–110)
Creatinine: 0.6 mg/dL (ref 0.6–1.1)
GFR African American: >60 (ref >60)
GFR Non-African American: >60 (ref >60)
Glucose: 120 mg/dL — ABNORMAL HIGH (ref 70–99)
Potassium: 4.2 mmol/L (ref 3.5–5.1)
Sodium: 137 mmol/L (ref 136–145)

## 2013-07-23 LAB — PHOSPHORUS: Phosphorus: 1.9 mg/dL — ABNORMAL LOW (ref 2.5–4.9)

## 2013-07-23 LAB — MAGNESIUM: Magnesium: 2.1 mg/dL (ref 1.80–2.40)

## 2013-07-23 MED ADMIN — ondansetron (ZOFRAN) injection 4 mg: INTRAVENOUS | @ 21:00:00 | NDC 23155037831

## 2013-07-23 MED ADMIN — sodium chloride flush 0.9 % injection 10 mL: INTRAVENOUS | @ 13:00:00

## 2013-07-23 MED ADMIN — ampicillin-sulbactam (UNASYN) 1.5 g in sodium chloride 0.9 % 50 mL IVPB: INTRAVENOUS | @ 13:00:00 | NDC 00264180031

## 2013-07-23 MED ADMIN — ondansetron (ZOFRAN) injection 4 mg: INTRAVENOUS | @ 14:00:00 | NDC 23155037831

## 2013-07-23 MED ADMIN — ampicillin-sulbactam (UNASYN) 1.5 g in sodium chloride 0.9 % 50 mL IVPB: INTRAVENOUS | @ 05:00:00 | NDC 00264180031

## 2013-07-23 MED ADMIN — dextrose 5 % in lactated ringers infusion: INTRAVENOUS | @ 14:00:00 | NDC 00338012504

## 2013-07-23 MED ADMIN — ondansetron (ZOFRAN) injection 4 mg: INTRAVENOUS | @ 01:00:00 | NDC 23155037831

## 2013-07-23 MED ADMIN — ondansetron (ZOFRAN) injection 4 mg: INTRAVENOUS | @ 07:00:00 | NDC 00781301095

## 2013-07-23 MED ADMIN — insulin lispro (HUMALOG) injection pen 0-12 Units: SUBCUTANEOUS | @ 05:00:00 | NDC 00002879901

## 2013-07-23 MED ADMIN — PN-Adult Premix 5/20 - Standard Electrolytes - Central Line: INTRAVENOUS | @ 22:00:00 | NDC 00338112504

## 2013-07-23 MED ADMIN — insulin lispro (HUMALOG) injection pen 0-6 Units: SUBCUTANEOUS | NDC 00002879901

## 2013-07-23 MED ADMIN — sodium phosphate 15 mmol in dextrose 5 % 250 mL IVPB: INTRAVENOUS | @ 17:00:00 | NDC 00517340525

## 2013-07-23 MED ADMIN — famotidine premixed 20mg in NS 50mL ivpb: INTRAVENOUS | @ 13:00:00 | NDC 00338519741

## 2013-07-23 MED ADMIN — famotidine premixed 20mg in NS 50mL ivpb: INTRAVENOUS | NDC 00338519741

## 2013-07-23 MED ADMIN — enoxaparin (LOVENOX) injection 40 mg: SUBCUTANEOUS | @ 13:00:00 | NDC 00075801301

## 2013-07-23 MED ADMIN — ampicillin-sulbactam (UNASYN) 1.5 g in sodium chloride 0.9 % 50 mL IVPB: INTRAVENOUS | @ 22:00:00 | NDC 00264180031

## 2013-07-23 MED ADMIN — HYDROmorphone HCl PF (DILAUDID) injection SOLN 1 mg: 1 mg | INTRAVENOUS | @ 07:00:00 | NDC 00409255201

## 2013-07-23 MED ADMIN — HYDROmorphone HCl PF (DILAUDID) injection SOLN 1 mg: 1 mg | INTRAVENOUS | @ 22:00:00 | NDC 00409128331

## 2013-07-23 MED ADMIN — HYDROmorphone HCl PF (DILAUDID) injection SOLN 1 mg: 1 mg | INTRAVENOUS | @ 01:00:00 | NDC 00409128331

## 2013-07-23 MED ADMIN — HYDROmorphone HCl PF (DILAUDID) injection SOLN 1 mg: 1 mg | INTRAVENOUS | @ 18:00:00 | NDC 00409128331

## 2013-07-23 MED ADMIN — HYDROmorphone HCl PF (DILAUDID) injection SOLN 1 mg: 1 mg | INTRAVENOUS | @ 14:00:00 | NDC 00409128331

## 2013-07-23 MED ADMIN — HYDROmorphone HCl PF (DILAUDID) injection SOLN 1 mg: 1 mg | INTRAVENOUS | @ 04:00:00 | NDC 00409128331

## 2013-07-23 MED ADMIN — HYDROmorphone HCl PF (DILAUDID) injection SOLN 1 mg: 1 mg | INTRAVENOUS | @ 11:00:00 | NDC 00409128331

## 2013-07-23 MED FILL — FAMOTIDINE PREMIXED 20-0.9 MG/50ML-% IV SOLN: INTRAVENOUS | Qty: 1

## 2013-07-23 MED FILL — NORMAL SALINE FLUSH 0.9 % IV SOLN: 0.9 % | INTRAVENOUS | Qty: 10

## 2013-07-23 MED FILL — HYDROMORPHONE HCL PF 1 MG/ML IJ SOLN: 1 MG/ML | INTRAMUSCULAR | Qty: 1

## 2013-07-23 MED FILL — UNASYN 1.5 (1-0.5) G IJ SOLR: 1.5 (1-0.5) g | INTRAMUSCULAR | Qty: 1.5

## 2013-07-23 MED FILL — ONDANSETRON HCL 4 MG/2ML IJ SOLN: 4 MG/2ML | INTRAMUSCULAR | Qty: 2

## 2013-07-23 MED FILL — SODIUM CHLORIDE 0.9 % IV SOLN: 0.9 % | INTRAVENOUS | Qty: 1000

## 2013-07-23 MED FILL — ENALAPRILAT 1.25 MG/ML IV INJ: 1.25 MG/ML | INTRAVENOUS | Qty: 1

## 2013-07-23 MED FILL — SODIUM PHOSPHATE 3 MMOLE/ML IV SOLN: 3 MMOLE/ML | INTRAVENOUS | Qty: 5

## 2013-07-23 MED FILL — LOVENOX 30 MG/0.3ML SC SOLN: 30 MG/0.3ML | SUBCUTANEOUS | Qty: 0.6

## 2013-07-23 MED FILL — DEXTROSE IN LACTATED RINGERS 5 % IV SOLN: 5 % | INTRAVENOUS | Qty: 1000

## 2013-07-23 MED FILL — CLINIMIX E/DEXTROSE (5/20) 5 % IV SOLN: 5 % | INTRAVENOUS | Qty: 2000

## 2013-07-23 NOTE — Progress Notes (Signed)
System Downtime Recovery  The EMR experienced a system downtime.  This downtime occurred on August 17th at 1 AM from 0100 to 0350. During this downtime paper charting was completed by me.  The following was documented on paper during the downtime and is now being back-entered: MAR, Flowsheets, & progress notes.

## 2013-07-23 NOTE — Plan of Care (Signed)
Problem: Pain  Goal: Reduction in pain sensation  Outcome: Ongoing  Patient pain being managed. Reports decrease in pain. Respirations regular, easy, and unlabored.

## 2013-07-23 NOTE — Progress Notes (Signed)
Assessment complete, see flowsheet. Patient c/o 7/10 pain in abdomen, states pain medicine helped. Dressing to abdomen CDI. JP drain in place, minimal output. BP elevated, given Vasotec & did bring BP down some. Will continue to monitor.

## 2013-07-23 NOTE — Progress Notes (Signed)
C/o pain & nausea, given zofran & dilaudid, see MAR.

## 2013-07-23 NOTE — Progress Notes (Signed)
[facility]  Gastroenterology Progress Note    Rachael Mays is a 57 y.o. female patient.  Principal Problem:    Nausea & vomiting  Active Problems:    Abdominal  pain, other specified site    Morbid obesity    Meniere syndrome    Complication of gastric banding    Wound infection (HCC)      SUBJECTIVE:  She feels better since IV Pepcid added this AM.  Still nausea and pyrosis, but better.    ROS: Denies dyspnea or cough.    Gastrointestinal ROS:  See above.    Physical    VITALS:  BP 160/74   Pulse 78   Temp(Src) 98.4 ??F (36.9 ??C) (Oral)   Resp 18   Ht 5\' 6"  (1.676 m)   Wt 220 lb (99.791 kg)   BMI 35.53 kg/m2   SpO2 98%  TEMPERATURE:  Current - Temp: 98.4 ??F (36.9 ??C); Max - Temp  Avg: 98.8 ??F (37.1 ??C)  Min: 98.2 ??F (36.8 ??C)  Max: 99.4 ??F (37.4 ??C)    NAD  RRR, Nl s1s2  Lungs CTA Bilaterally, normal effort  Abdomen soft, ND, NT, no HSM, Bowel sounds normal.    Data    Data Review:    Recent Labs      07/22/13   0300  07/23/13   0659   WBC  11.5*  12.6*   HGB  12.8  13.3   HCT  39.3  41.2   MCV  84.4  85.9   PLT  407  363     Recent Labs      07/22/13   0300  07/23/13   0659   NA  135*  137   K  3.8  4.2   CL  96*  101   CO2  20*  26   PHOS   --   1.9*   BUN  17  16   CREATININE  0.7  0.6     Recent Labs      07/22/13   0300   AST  40*   ALT  53*   BILIDIR  0.3   BILITOT  0.5   ALKPHOS  127     Recent Labs      07/22/13   0300   LIPASE  56.0     No results found for this basename: PROTIME, INR,  in the last 72 hours  No results found for this basename: PTT,  in the last 72 hours          ASSESSMENT : Chest pain, Nausea and vomiting s/p Esophageal Stent placement - suspect result of stent in place.  No apparent complication of stent on CT, so would leave in for 2 - 4 weeks in hopes of closing gastric fistula. She is improved today.  Will give trial of clears and plan UGI in AM.      PLAN  :  1) Clear liquids ok from GI standpoint.     2) IV Dilaudid, Zofran and Phenergan for symptom relief.     3) IV Protonix and  Pepcid for now.     4) Proceed with UGI Monday to re-evaluate stent and fistula.    Kandice Robinsons, MD  [facility]  07/23/2013

## 2013-07-23 NOTE — Plan of Care (Signed)
Problem: Pain  Goal: Control of acute pain  Outcome: Ongoing  Pt able to communicate pain using 0-10 pain scale. Medications given per pt request as ordered for pain. Ice therapy and repositioning used as non pharmacological interventions for pain mgmt. Will continue to monitor and assess pain.

## 2013-07-23 NOTE — Plan of Care (Signed)
Problem: Falls - Risk of  Goal: Absence of falls  Outcome: Ongoing  Medium fall risk. Patient will remain free of fall during stay.Room free of clutter,Bed low position.Encouraged to call nurse when needs to get out of the bed.Personal belongings and call light in reach.

## 2013-07-23 NOTE — Progress Notes (Signed)
Dressing to abdomen changed, packed with iodiform. Dressing around JP changed as well. Patient tolerated well.

## 2013-07-23 NOTE — Progress Notes (Signed)
Ambulating the room,gait steady.Family at bed side.Call light in reach.

## 2013-07-23 NOTE — Progress Notes (Signed)
Department of Family Practice  Adult Daily Progress Note      SUBJECTIVE  Still with nausea and emesis this AM.    OBJECTIVE    Physical    BP 172/82   Pulse 81   Temp(Src) 98.6 ??F (37 ??C) (Oral)   Resp 16   Ht 5\' 6"  (1.676 m)   Wt 220 lb (99.791 kg)   BMI 35.53 kg/m2   SpO2 98%         CONSTITUTIONAL:   alert and in mild distress  CHEST:  clear to auscultation bilaterally  CARDIOVASCULAR:  Cor RRR and No murmurs  ABDOMEN:  soft and normal bowel sounds  EXTREMITIES:  none edema bilateral    Data    CBC:   Lab Results   Component Value Date    WBC 12.6 07/23/2013    RBC 4.80 07/23/2013    HGB 13.3 07/23/2013    HCT 41.2 07/23/2013    MCV 85.9 07/23/2013    MCH 27.8 07/23/2013    MCHC 32.4 07/23/2013    RDW 13.8 07/23/2013    PLT 363 07/23/2013    MPV 8.5 07/23/2013     BMP:    Lab Results   Component Value Date    NA 137 07/23/2013    K 4.2 07/23/2013    CL 101 07/23/2013    CO2 26 07/23/2013    BUN 16 07/23/2013    LABALBU 3.8 07/22/2013    CREATININE 0.6 07/23/2013    CREATININE 0.8 05/15/2011    CALCIUM 9.4 07/23/2013    GFRAA >60 07/23/2013    GFRAA >60 03/16/2012    LABGLOM >60 07/23/2013    GLUCOSE 120 07/23/2013     PT/INR:    Lab Results   Component Value Date    PROTIME 10.9 05/15/2011    INR 0.95 05/15/2011       Labs & X-rays results reviewed      ASSESSMENT AND PLAN    Principal Problem:    Nausea & vomiting  Active Problems:    Abdominal  pain, other specified site    Morbid obesity    Meniere syndrome    Complication of gastric banding    Wound infection (HCC)        Will cont with IV fluid support   Agree with UGI Monday  Cont antiemetics

## 2013-07-23 NOTE — Progress Notes (Signed)
Clinical Pharmacy Note    Continue TPN at cyclic rate.   Phosphorus level = 1.9 Give Sodium phophate IVPB x1 today  Per TPN policy  Milas Hock PharmD

## 2013-07-23 NOTE — Progress Notes (Signed)
Still having nausea, but improved from yesterday.  No further emesis.  Pain is controlled on meds.  Ambulating this am.    Filed Vitals:    07/23/13 1024   BP: 160/74   Pulse: 78   Temp: 98.4 ??F (36.9 ??C)   Resp: 18     Abd: soft, wound looks good.         AP:  S/p band removal for erosion.  Stent placement to resolve area of contained leak  Clear liquids as tolerated  Cont TPN  UGI in AM

## 2013-07-24 LAB — CBC WITH AUTO DIFFERENTIAL
Basophils %: 0.4 %
Basophils Absolute: 0.1 10*3/uL (ref 0.0–0.2)
Eosinophils %: 1.4 %
Eosinophils Absolute: 0.2 10*3/uL (ref 0.0–0.6)
Hematocrit: 39.1 % (ref 36.0–48.0)
Hemoglobin: 12.7 g/dL (ref 12.0–16.0)
Lymphocytes %: 18.8 %
Lymphocytes Absolute: 2.3 10*3/uL (ref 1.0–5.1)
MCH: 27.7 pg (ref 26.0–34.0)
MCHC: 32.5 g/dL (ref 31.0–36.0)
MCV: 85 fL (ref 80.0–100.0)
MPV: 8.2 fL (ref 5.0–10.5)
Monocytes %: 10 %
Monocytes Absolute: 1.2 10*3/uL (ref 0.0–1.3)
Neutrophils %: 69.4 %
Neutrophils Absolute: 8.4 10*3/uL — ABNORMAL HIGH (ref 1.7–7.7)
Platelets: 383 10*3/uL (ref 135–450)
RBC: 4.59 M/uL (ref 4.00–5.20)
RDW: 14.3 % (ref 12.4–15.4)
WBC: 12.2 10*3/uL — ABNORMAL HIGH (ref 4.0–11.0)

## 2013-07-24 LAB — BASIC METABOLIC PANEL
BUN: 17 mg/dL (ref 7–20)
CO2: 26 mmol/L (ref 21–32)
Calcium: 9.3 mg/dL (ref 8.3–10.6)
Chloride: 100 mmol/L (ref 99–110)
Creatinine: 0.5 mg/dL — ABNORMAL LOW (ref 0.6–1.1)
GFR African American: >60 (ref >60)
GFR Non-African American: >60 (ref >60)
Glucose: 121 mg/dL — ABNORMAL HIGH (ref 70–99)
Potassium: 3.6 mmol/L (ref 3.5–5.1)
Sodium: 134 mmol/L — ABNORMAL LOW (ref 136–145)

## 2013-07-24 LAB — POCT GLUCOSE
POC Glucose: 166 mg/dl — ABNORMAL HIGH (ref 70–99)
POC Glucose: 142 mg/dl — ABNORMAL HIGH (ref 70–99)
POC Glucose: 100 mg/dl — ABNORMAL HIGH (ref 70–99)
POC Glucose: 117 mg/dl — ABNORMAL HIGH (ref 70–99)
POC Glucose: 101 mg/dl — ABNORMAL HIGH (ref 70–99)
POC Glucose: 94 mg/dl (ref 70–99)

## 2013-07-24 LAB — PHOSPHORUS: Phosphorus: 2.4 mg/dL — ABNORMAL LOW (ref 2.5–4.9)

## 2013-07-24 LAB — MAGNESIUM: Magnesium: 2.1 mg/dL (ref 1.80–2.40)

## 2013-07-24 LAB — TRIGLYCERIDES: Triglycerides: 102 mg/dL (ref 0–150)

## 2013-07-24 MED ADMIN — fat emulsion 20 % infusion 250 mL: INTRAVENOUS | @ 22:00:00 | NDC 00338051902

## 2013-07-24 MED ADMIN — ampicillin-sulbactam (UNASYN) 1.5 g in sodium chloride 0.9 % 50 mL IVPB: INTRAVENOUS | @ 22:00:00 | NDC 00264180031

## 2013-07-24 MED ADMIN — cloNIDine (CATAPRES) 0.1 MG/24HR 1 patch: TRANSDERMAL | @ 13:00:00 | NDC 00597003134

## 2013-07-24 MED ADMIN — PN-Adult Premix 5/20 - Standard Electrolytes - Central Line: INTRAVENOUS | @ 22:00:00 | NDC 63323014397

## 2013-07-24 MED ADMIN — enoxaparin (LOVENOX) injection 40 mg: SUBCUTANEOUS | @ 14:00:00 | NDC 00075801301

## 2013-07-24 MED ADMIN — famotidine premixed 20mg in NS 50mL ivpb: INTRAVENOUS | @ 15:00:00 | NDC 00338519741

## 2013-07-24 MED ADMIN — dextrose 5 % in lactated ringers infusion: INTRAVENOUS | @ 09:00:00 | NDC 00338012504

## 2013-07-24 MED ADMIN — ondansetron (ZOFRAN) injection 4 mg: INTRAVENOUS | @ 13:00:00 | NDC 23155037831

## 2013-07-24 MED ADMIN — ampicillin-sulbactam (UNASYN) 1.5 g in sodium chloride 0.9 % 50 mL IVPB: INTRAVENOUS | @ 06:00:00 | NDC 00264180031

## 2013-07-24 MED ADMIN — promethazine (PHENERGAN) injection 12.5 mg: INTRAVENOUS | @ 17:00:00 | NDC 00641092821

## 2013-07-24 MED ADMIN — promethazine (PHENERGAN) injection 12.5 mg: INTRAVENOUS | @ 21:00:00 | NDC 00641092821

## 2013-07-24 MED ADMIN — insulin lispro (HUMALOG) injection pen 0-12 Units: SUBCUTANEOUS | @ 01:00:00 | NDC 00002879901

## 2013-07-24 MED ADMIN — sodium chloride flush 0.9 % injection 10 mL: INTRAVENOUS | @ 01:00:00

## 2013-07-24 MED ADMIN — ampicillin-sulbactam (UNASYN) 1.5 g in sodium chloride 0.9 % 50 mL IVPB: INTRAVENOUS | @ 14:00:00 | NDC 00264180031

## 2013-07-24 MED ADMIN — dextrose 5 % in lactated ringers infusion: INTRAVENOUS | @ 21:00:00 | NDC 00338012504

## 2013-07-24 MED ADMIN — famotidine premixed 20mg in NS 50mL ivpb: INTRAVENOUS | @ 01:00:00 | NDC 00338519741

## 2013-07-24 MED ADMIN — ondansetron (ZOFRAN) injection 4 mg: INTRAVENOUS | @ 23:00:00 | NDC 23155037831

## 2013-07-24 MED ADMIN — sodium phosphate 15 mmol in dextrose 5 % 250 mL IVPB: INTRAVENOUS | @ 17:00:00 | NDC 00517340525

## 2013-07-24 MED ADMIN — ondansetron (ZOFRAN) injection 4 mg: INTRAVENOUS | @ 07:00:00 | NDC 23155037831

## 2013-07-24 MED ADMIN — HYDROmorphone HCl PF (DILAUDID) injection SOLN 1 mg: 1 mg | INTRAVENOUS | @ 01:00:00 | NDC 00409128331

## 2013-07-24 MED ADMIN — HYDROmorphone HCl PF (DILAUDID) injection SOLN 1 mg: 1 mg | INTRAVENOUS | @ 13:00:00 | NDC 00409128331

## 2013-07-24 MED ADMIN — HYDROmorphone HCl PF (DILAUDID) injection SOLN 1 mg: 1 mg | INTRAVENOUS | @ 10:00:00 | NDC 00409128331

## 2013-07-24 MED ADMIN — HYDROmorphone HCl PF (DILAUDID) injection SOLN 1 mg: 1 mg | INTRAVENOUS | @ 23:00:00 | NDC 00409128331

## 2013-07-24 MED ADMIN — HYDROmorphone HCl PF (DILAUDID) injection SOLN 1 mg: 1 mg | INTRAVENOUS | @ 07:00:00 | NDC 00409128331

## 2013-07-24 MED ADMIN — HYDROmorphone HCl PF (DILAUDID) injection SOLN 1 mg: 1 mg | INTRAVENOUS | @ 20:00:00 | NDC 00409128331

## 2013-07-24 MED ADMIN — HYDROmorphone HCl PF (DILAUDID) injection SOLN 1 mg: 1 mg | INTRAVENOUS | @ 04:00:00 | NDC 00409128331

## 2013-07-24 MED ADMIN — HYDROmorphone HCl PF (DILAUDID) injection SOLN 1 mg: 1 mg | INTRAVENOUS | @ 16:00:00 | NDC 00409128331

## 2013-07-24 MED FILL — HYDROMORPHONE HCL PF 1 MG/ML IJ SOLN: 1 MG/ML | INTRAMUSCULAR | Qty: 1

## 2013-07-24 MED FILL — ONDANSETRON HCL 4 MG/2ML IJ SOLN: 4 MG/2ML | INTRAMUSCULAR | Qty: 2

## 2013-07-24 MED FILL — PROMETHAZINE HCL 25 MG/ML IJ SOLN: 25 MG/ML | INTRAMUSCULAR | Qty: 1

## 2013-07-24 MED FILL — CLONIDINE HCL 0.1 MG/24HR TD PTWK: 0.1 MG/24HR | TRANSDERMAL | Qty: 1

## 2013-07-24 MED FILL — NORMAL SALINE FLUSH 0.9 % IV SOLN: 0.9 % | INTRAVENOUS | Qty: 10

## 2013-07-24 MED FILL — UNASYN 1.5 (1-0.5) G IJ SOLR: 1.5 (1-0.5) g | INTRAMUSCULAR | Qty: 1.5

## 2013-07-24 MED FILL — DEXTROSE IN LACTATED RINGERS 5 % IV SOLN: 5 % | INTRAVENOUS | Qty: 1000

## 2013-07-24 MED FILL — INTRALIPID 20 % IV EMUL: 20 % | INTRAVENOUS | Qty: 250

## 2013-07-24 MED FILL — FAMOTIDINE PREMIXED 20-0.9 MG/50ML-% IV SOLN: INTRAVENOUS | Qty: 1

## 2013-07-24 MED FILL — CLINIMIX E/DEXTROSE (5/20) 5 % IV SOLN: 5 % | INTRAVENOUS | Qty: 2000

## 2013-07-24 MED FILL — LOVENOX 30 MG/0.3ML SC SOLN: 30 MG/0.3ML | SUBCUTANEOUS | Qty: 0.6

## 2013-07-24 MED FILL — SODIUM PHOSPHATE 3 MMOLE/ML IV SOLN: 3 MMOLE/ML | INTRAVENOUS | Qty: 5

## 2013-07-24 NOTE — Progress Notes (Signed)
Patient in bed awake resting,respiratory easy.This shift pt still having nausea and pain,more nauseous and with emesis with movements(ambulation).Keep NPO for UG today.No need at this time, call light in reach, bed alarm on

## 2013-07-24 NOTE — Progress Notes (Signed)
Patient in bed awake alert, respiratory easy.Denies any current needs at this time, call light in reach, family at bed side

## 2013-07-24 NOTE — Progress Notes (Signed)
Dr. Daphine Deutscher at bedside, stated to encourage clear liquids, patient states she will try but everything makes her nauseated.  Ander Slade

## 2013-07-24 NOTE — Progress Notes (Signed)
Assessment complete. VSS. Lung sounds clear with unlabored breathing. Abdomen is soft, tender to touch, patient states she is still nauseated and rating pain level 7/10 after dilaudid given. Dressing to abdomen is CD&I. Call light within reach and bed alarm on, will continue to monitor.  Rachael Mays

## 2013-07-24 NOTE — Progress Notes (Signed)
[facility]  Gastroenterology Progress Note    Rachael Mays is a 57 y.o. female patient.  1. Abdominal pain    2. Nausea and vomiting        SUBJECTIVE:  Still nausea and subxyphoid pain.  Finished UGI short time ago.    ROS:  Cardiovascular ROS: positive for - chest pain  negative for - dyspnea on exertion, murmur or shortness of breath  Gastrointestinal ROS: positive for - abdominal pain, appetite loss and nausea/vomiting  negative for - diarrhea, hematemesis or melena  Respiratory ROS: no cough, shortness of breath, or wheezing    Physical    VITALS:  BP 168/87   Pulse 78   Temp(Src) 98.9 ??F (37.2 ??C) (Oral)   Resp 18   Ht 5\' 6"  (1.676 m)   Wt 226 lb (102.513 kg)   BMI 36.49 kg/m2   SpO2 97%  TEMPERATURE:  Current - Temp: 98.9 ??F (37.2 ??C); Max - Temp  Avg: 98.8 ??F (37.1 ??C)  Min: 97.4 ??F (36.3 ??C)  Max: 99.6 ??F (37.6 ??C)    NAD  RRR, Nl s1s2  Lungs CTA Bilaterally, normal effort  Abdomen soft, ND, NT, no HSM, Bowel sounds normal  AAOx3, No asterixis     Data    CBC:   Lab Results   Component Value Date    WBC 12.2 07/24/2013    RBC 4.59 07/24/2013    HGB 12.7 07/24/2013    HCT 39.1 07/24/2013    MCV 85.0 07/24/2013    MCH 27.7 07/24/2013    MCHC 32.5 07/24/2013    RDW 14.3 07/24/2013    PLT 383 07/24/2013    MPV 8.2 07/24/2013     Hepatic Function Panel:    Lab Results   Component Value Date    ALKPHOS 127 07/22/2013    ALT 53 07/22/2013    AST 40 07/22/2013    PROT 8.5 07/22/2013    PROT 7.3 03/31/2011    BILITOT 0.5 07/22/2013    BILIDIR 0.3 07/22/2013    IBILI 0.2 07/22/2013         Radiology Review:  UGI:    Unchanged position of the esophageal expandable mesh   stent as compared to earlier CT study of 07/22/2013. The distal end   of the stent is projecting directly along the lateral wall of the   stomach. No extravasation was observed.             ASSESSMENT :  Chest pain, Nausea and vomiting s/p Esophageal Stent placement - suspect result of stent in place. No apparent complication of stent on CT or UGI so would leave in for 2  - 4 weeks in hopes of closing gastric fistula. She seems mildy improved today. Will give trial of clears.    PLAN :   1) Clear liquids ok from GI standpoint.     2) IV Dilaudid, Zofran and Phenergan for symptom relief.     3) IV Protonix and Pepcid for now.    Kandice Robinsons, MD  [facility]  07/24/2013

## 2013-07-24 NOTE — Progress Notes (Signed)
Pain better now per pt.Rechecked BP 155/78,HR 88.Keep NPO,husband at bed side

## 2013-07-24 NOTE — Progress Notes (Signed)
Dilaudid given for pain  zofran given for nausea

## 2013-07-24 NOTE — Progress Notes (Signed)
[phone] this pt prior to admit with [facility] for home TPN. Discharge planner notified.  Will follow.

## 2013-07-24 NOTE — Progress Notes (Signed)
Patient continues to state she is in pain and nauseated, emesis consist of thick mucous. Daughter at bedside, will continue to monitor.  Rachael Mays

## 2013-07-24 NOTE — Progress Notes (Signed)
Patient states she is unable to go down to xray for UGI due to the amount of nausea she is having, will medicate and xray notified.   Ander Slade

## 2013-07-24 NOTE — Progress Notes (Signed)
Pain and nausea medication given to patient, patient stated she thinks she can do UGI, will call xray and send patient down.  Ander Slade

## 2013-07-24 NOTE — Progress Notes (Signed)
Department of Family Practice  Adult Daily Progress Note      SUBJECTIVE  Still with nausea and  Pain, blood pressure is remaining high, has continual heartburn    OBJECTIVE    Physical    BP 159/76   Pulse 71   Temp(Src) 97.4 ??F (36.3 ??C) (Temporal)   Resp 16   Ht 5\' 6"  (1.676 m)   Wt 226 lb (102.513 kg)   BMI 36.49 kg/m2   SpO2 99%         CONSTITUTIONAL:   alert and in mild distress  CHEST:  clear to auscultation bilaterally  CARDIOVASCULAR:  Cor RRR and No murmurs  ABDOMEN:  soft and normal bowel sounds  EXTREMITIES:  none edema bilateral    Data    CBC:   Lab Results   Component Value Date    WBC 12.2 07/24/2013    RBC 4.59 07/24/2013    HGB 12.7 07/24/2013    HCT 39.1 07/24/2013    MCV 85.0 07/24/2013    MCH 27.7 07/24/2013    MCHC 32.5 07/24/2013    RDW 14.3 07/24/2013    PLT 383 07/24/2013    MPV 8.2 07/24/2013     BMP:    Lab Results   Component Value Date    NA 134 07/24/2013    K 3.6 07/24/2013    CL 100 07/24/2013    CO2 26 07/24/2013    BUN 17 07/24/2013    LABALBU 3.8 07/22/2013    CREATININE 0.5 07/24/2013    CREATININE 0.8 05/15/2011    CALCIUM 9.3 07/24/2013    GFRAA >60 07/24/2013    GFRAA >60 03/16/2012    LABGLOM >60 07/24/2013    GLUCOSE 121 07/24/2013     PT/INR:    Lab Results   Component Value Date    PROTIME 10.9 05/15/2011    INR 0.95 05/15/2011       Labs & X-rays results reviewed      ASSESSMENT AND PLAN    Principal Problem:    Nausea & vomiting  Active Problems:    Abdominal  pain, other specified site    Morbid obesity    Meniere syndrome    Complication of gastric banding    Wound infection (HCC)  HTN      Add catapres patch

## 2013-07-25 LAB — POCT GLUCOSE
POC Glucose: 146 mg/dl — ABNORMAL HIGH (ref 70–99)
POC Glucose: 196 mg/dl — ABNORMAL HIGH (ref 70–99)
POC Glucose: 126 mg/dl — ABNORMAL HIGH (ref 70–99)
POC Glucose: 116 mg/dl — ABNORMAL HIGH (ref 70–99)
POC Glucose: 110 mg/dl — ABNORMAL HIGH (ref 70–99)
POC Glucose: 80 mg/dl (ref 70–99)

## 2013-07-25 LAB — BASIC METABOLIC PANEL
BUN: 16 mg/dL (ref 7–20)
CO2: 28 mmol/L (ref 21–32)
Calcium: 8.9 mg/dL (ref 8.3–10.6)
Chloride: 101 mmol/L (ref 99–110)
Creatinine: 0.6 mg/dL (ref 0.6–1.1)
GFR African American: >60 (ref >60)
GFR Non-African American: >60 (ref >60)
Glucose: 108 mg/dL — ABNORMAL HIGH (ref 70–99)
Potassium: 3.5 mmol/L (ref 3.5–5.1)
Sodium: 137 mmol/L (ref 136–145)

## 2013-07-25 LAB — PHOSPHORUS: Phosphorus: 3.2 mg/dL (ref 2.5–4.9)

## 2013-07-25 LAB — MAGNESIUM: Magnesium: 2.3 mg/dL (ref 1.80–2.40)

## 2013-07-25 MED ADMIN — promethazine (PHENERGAN) injection 12.5 mg: INTRAVENOUS | @ 14:00:00 | NDC 00641092821

## 2013-07-25 MED ADMIN — ampicillin-sulbactam (UNASYN) 1.5 g in sodium chloride 0.9 % 50 mL IVPB: INTRAVENOUS | @ 21:00:00 | NDC 00264180031

## 2013-07-25 MED ADMIN — ondansetron (ZOFRAN) injection 4 mg: INTRAVENOUS | @ 06:00:00 | NDC 23155037831

## 2013-07-25 MED ADMIN — dextrose 5 % in lactated ringers infusion: INTRAVENOUS | @ 18:00:00 | NDC 00338012504

## 2013-07-25 MED ADMIN — promethazine (PHENERGAN) injection 12.5 mg: INTRAVENOUS | @ 18:00:00 | NDC 00641092821

## 2013-07-25 MED ADMIN — ALPRAZolam (XANAX) tablet 0.5 mg: ORAL | @ 04:00:00 | NDC 68084067211

## 2013-07-25 MED ADMIN — promethazine (PHENERGAN) injection 12.5 mg: INTRAVENOUS | @ 21:00:00 | NDC 00641092821

## 2013-07-25 MED ADMIN — promethazine (PHENERGAN) injection 12.5 mg: INTRAVENOUS | @ 01:00:00 | NDC 00641092821

## 2013-07-25 MED ADMIN — enoxaparin (LOVENOX) injection 40 mg: SUBCUTANEOUS | @ 13:00:00 | NDC 00075801401

## 2013-07-25 MED ADMIN — PN-Adult Premix 5/20 - Standard Electrolytes - Central Line: INTRAVENOUS | @ 21:00:00 | NDC 00338112504

## 2013-07-25 MED ADMIN — promethazine (PHENERGAN) injection 12.5 mg: INTRAVENOUS | @ 09:00:00 | NDC 00641092821

## 2013-07-25 MED ADMIN — promethazine (PHENERGAN) injection 12.5 mg: INTRAVENOUS | @ 05:00:00 | NDC 00641092821

## 2013-07-25 MED ADMIN — insulin lispro (HUMALOG) injection pen 0-12 Units: SUBCUTANEOUS | @ 05:00:00 | NDC 00002879901

## 2013-07-25 MED ADMIN — ondansetron (ZOFRAN) injection 4 mg: INTRAVENOUS | @ 13:00:00 | NDC 23155037831

## 2013-07-25 MED ADMIN — sodium chloride flush 0.9 % injection 10 mL: INTRAVENOUS | @ 02:00:00

## 2013-07-25 MED ADMIN — citalopram (CELEXA) tablet 40 mg: ORAL | @ 13:00:00 | NDC 68084074411

## 2013-07-25 MED ADMIN — famotidine premixed 20mg in NS 50mL ivpb: INTRAVENOUS | @ 14:00:00 | NDC 00338519741

## 2013-07-25 MED ADMIN — ampicillin-sulbactam (UNASYN) 1.5 g in sodium chloride 0.9 % 50 mL IVPB: INTRAVENOUS | @ 13:00:00 | NDC 00264180031

## 2013-07-25 MED ADMIN — ampicillin-sulbactam (UNASYN) 1.5 g in sodium chloride 0.9 % 50 mL IVPB: INTRAVENOUS | @ 05:00:00 | NDC 00264180031

## 2013-07-25 MED ADMIN — famotidine premixed 20mg in NS 50mL ivpb: INTRAVENOUS | @ 02:00:00 | NDC 00338519741

## 2013-07-25 MED ADMIN — sodium chloride flush 0.9 % injection 10 mL: INTRAVENOUS

## 2013-07-25 MED ADMIN — HYDROmorphone HCl PF (DILAUDID) injection SOLN 1 mg: 1 mg | INTRAVENOUS | @ 18:00:00 | NDC 00409128331

## 2013-07-25 MED ADMIN — HYDROmorphone HCl PF (DILAUDID) injection SOLN 1 mg: 1 mg | INTRAVENOUS | NDC 00409128331

## 2013-07-25 MED ADMIN — HYDROmorphone HCl PF (DILAUDID) injection SOLN 1 mg: 1 mg | INTRAVENOUS | @ 21:00:00 | NDC 00409128331

## 2013-07-25 MED ADMIN — HYDROmorphone HCl PF (DILAUDID) injection SOLN 1 mg: 1 mg | INTRAVENOUS | @ 06:00:00 | NDC 00409128331

## 2013-07-25 MED ADMIN — HYDROmorphone HCl PF (DILAUDID) injection SOLN 1 mg: 1 mg | INTRAVENOUS | @ 02:00:00 | NDC 00409128331

## 2013-07-25 MED ADMIN — HYDROmorphone HCl PF (DILAUDID) injection SOLN 1 mg: 1 mg | INTRAVENOUS | @ 10:00:00 | NDC 00409128331

## 2013-07-25 MED ADMIN — HYDROmorphone HCl PF (DILAUDID) injection SOLN 1 mg: 1 mg | INTRAVENOUS | @ 14:00:00 | NDC 00409128331

## 2013-07-25 MED FILL — CLINIMIX E/DEXTROSE (5/20) 5 % IV SOLN: 5 % | INTRAVENOUS | Qty: 2000

## 2013-07-25 MED FILL — NORMAL SALINE FLUSH 0.9 % IV SOLN: 0.9 % | INTRAVENOUS | Qty: 10

## 2013-07-25 MED FILL — FAMOTIDINE PREMIXED 20-0.9 MG/50ML-% IV SOLN: INTRAVENOUS | Qty: 1

## 2013-07-25 MED FILL — XYLOCAINE 2 % IJ SOLN: 2 % | INTRAMUSCULAR | Qty: 10

## 2013-07-25 MED FILL — UNASYN 1.5 (1-0.5) G IJ SOLR: 1.5 (1-0.5) g | INTRAMUSCULAR | Qty: 1.5

## 2013-07-25 MED FILL — ONDANSETRON HCL 4 MG/2ML IJ SOLN: 4 MG/2ML | INTRAMUSCULAR | Qty: 2

## 2013-07-25 MED FILL — PROPOFOL 10 MG/ML IV EMUL: 10 MG/ML | INTRAVENOUS | Qty: 20

## 2013-07-25 MED FILL — PROMETHAZINE HCL 25 MG/ML IJ SOLN: 25 MG/ML | INTRAMUSCULAR | Qty: 1

## 2013-07-25 MED FILL — HYDROMORPHONE HCL PF 1 MG/ML IJ SOLN: 1 MG/ML | INTRAMUSCULAR | Qty: 1

## 2013-07-25 MED FILL — QUELICIN 20 MG/ML IJ SOLN: 20 MG/ML | INTRAMUSCULAR | Qty: 10

## 2013-07-25 MED FILL — DEXTROSE IN LACTATED RINGERS 5 % IV SOLN: 5 % | INTRAVENOUS | Qty: 1000

## 2013-07-25 MED FILL — NORMAL SALINE FLUSH 0.9 % IV SOLN: 0.9 % | INTRAVENOUS | Qty: 30

## 2013-07-25 MED FILL — CITALOPRAM HYDROBROMIDE 20 MG PO TABS: 20 MG | ORAL | Qty: 2

## 2013-07-25 MED FILL — NORMAL SALINE FLUSH 0.9 % IV SOLN: 0.9 % | INTRAVENOUS | Qty: 20

## 2013-07-25 MED FILL — LOVENOX 30 MG/0.3ML SC SOLN: 30 MG/0.3ML | SUBCUTANEOUS | Qty: 0.6

## 2013-07-25 MED FILL — ALPRAZOLAM 0.5 MG PO TABS: 0.5 MG | ORAL | Qty: 1

## 2013-07-25 NOTE — Progress Notes (Signed)
Pt given phenergan 12.5 mg ivp for nausea and 1 mg dilaudid ivp for c/o 7/10 abd pain. Pt up to bathroom for positive void and back to bed independently. Continues to tolerate clear liquid diet.

## 2013-07-25 NOTE — Progress Notes (Addendum)
[facility]  Gastroenterology Progress Note    Rachael Mays is a 57 y.o. female patient.  1. Abdominal pain    2. Nausea and vomiting        SUBJECTIVE:  Still nausea and subxyphoid pain.  No vomiting. Taking in some clear liquids.    ROS:  Cardiovascular ROS: positive for - chest pain  negative for - dyspnea on exertion, murmur or shortness of breath  Gastrointestinal ROS: positive for - abdominal pain, appetite loss and nausea/vomiting  negative for - diarrhea, hematemesis or melena  Respiratory ROS: no cough, shortness of breath, or wheezing    Physical    VITALS:  BP 158/90   Pulse 86   Temp(Src) 99 ??F (37.2 ??C) (Oral)   Resp 19   Ht 5\' 6"  (1.676 m)   Wt 225 lb (102.059 kg)   BMI 36.33 kg/m2   SpO2 97%  TEMPERATURE:  Current - Temp: 99 ??F (37.2 ??C); Max - Temp  Avg: 98.8 ??F (37.1 ??C)  Min: 98.3 ??F (36.8 ??C)  Max: 99 ??F (37.2 ??C)    NAD  RRR, Nl s1s2  Lungs CTA Bilaterally, normal effort  Abdomen soft, ND, mild epigastric tenderness, no HSM, Bowel sounds normal  AAOx3, No asterixis     Data    CBC:   Lab Results   Component Value Date    WBC 12.2 07/24/2013    RBC 4.59 07/24/2013    HGB 12.7 07/24/2013    HCT 39.1 07/24/2013    MCV 85.0 07/24/2013    MCH 27.7 07/24/2013    MCHC 32.5 07/24/2013    RDW 14.3 07/24/2013    PLT 383 07/24/2013    MPV 8.2 07/24/2013     Hepatic Function Panel:    Lab Results   Component Value Date    ALKPHOS 127 07/22/2013    ALT 53 07/22/2013    AST 40 07/22/2013    PROT 8.5 07/22/2013    PROT 7.3 03/31/2011    BILITOT 0.5 07/22/2013    BILIDIR 0.3 07/22/2013    IBILI 0.2 07/22/2013         Radiology Review:  UGI:    Unchanged position of the esophageal expandable mesh   stent as compared to earlier CT study of 07/22/2013. The distal end   of the stent is projecting directly along the lateral wall of the   stomach. No extravasation was observed.             ASSESSMENT :  Chest pain, Nausea and vomiting s/p Esophageal Stent placement - suspect result of stent in place. No apparent complication of stent on  CT or UGI so would leave in for 2 - 4 weeks in hopes of closing gastric fistula.      PLAN :   1) Clear liquids ok from GI standpoint.     2) IV Dilaudid, Zofran and Phenergan for symptom relief.     3) IV Protonix and Pepcid for now.    Soyla Dryer, PA-C  [facility]    [facility] STAFF  I have personally performed a face to face diagnostic evaluation on this patient.  I have interviewed and examined the patient and I agree with the findings and recommended plan of care.  In summary, my findings and plan are the following: Looks better, but states feeling same.  Now taking Clears.  Agree with Dr. Suzi Roots to attempt to convert to po meds and antiemetics.  Diet as  tolerated ok from GI standpoint.    Kandice Robinsons, MD  [facility]  07/25/2013

## 2013-07-25 NOTE — Progress Notes (Signed)
Patient ambulating the hallway at this time,gait steady,standby assist with daughter

## 2013-07-25 NOTE — Progress Notes (Signed)
Gave 4 mg zofran ivp for c/o persistent nausea. Pt is awake alert and oriented. Completed assessment at this time.

## 2013-07-25 NOTE — Progress Notes (Signed)
Patient in bed awake alert, respiratory easy.Denies any current needs at this time, call light in reach

## 2013-07-25 NOTE — Progress Notes (Signed)
Department of Family Practice  Adult Daily Progress Note      SUBJECTIVE  Still with nausea and  Pain, blood pressure is remaining high, has continual heartburn, vomiting has stopped, oral intake minimal    OBJECTIVE    Physical    BP 158/90   Pulse 86   Temp(Src) 99 ??F (37.2 ??C) (Oral)   Resp 19   Ht 5\' 6"  (1.676 m)   Wt 225 lb (102.059 kg)   BMI 36.33 kg/m2   SpO2 97%         CONSTITUTIONAL:   alert and in mild distress  CHEST:  clear to auscultation bilaterally  CARDIOVASCULAR:  Cor RRR and No murmurs  ABDOMEN:  soft and normal bowel sounds  EXTREMITIES:  none edema bilateral    Data    CBC:   Lab Results   Component Value Date    WBC 12.2 07/24/2013    RBC 4.59 07/24/2013    HGB 12.7 07/24/2013    HCT 39.1 07/24/2013    MCV 85.0 07/24/2013    MCH 27.7 07/24/2013    MCHC 32.5 07/24/2013    RDW 14.3 07/24/2013    PLT 383 07/24/2013    MPV 8.2 07/24/2013     BMP:    Lab Results   Component Value Date    NA 137 07/25/2013    K 3.5 07/25/2013    CL 101 07/25/2013    CO2 28 07/25/2013    BUN 16 07/25/2013    LABALBU 3.8 07/22/2013    CREATININE 0.6 07/25/2013    CREATININE 0.8 05/15/2011    CALCIUM 8.9 07/25/2013    GFRAA >60 07/25/2013    GFRAA >60 03/16/2012    LABGLOM >60 07/25/2013    GLUCOSE 108 07/25/2013     PT/INR:    Lab Results   Component Value Date    PROTIME 10.9 05/15/2011    INR 0.95 05/15/2011       Labs & X-rays results reviewed      ASSESSMENT AND PLAN    Principal Problem:    Nausea & vomiting  Active Problems:    Abdominal  pain, other specified site    Morbid obesity    Meniere syndrome    Complication of gastric banding    Wound infection (HCC)    HTN (hypertension)    Hyperglycemia  HTN    Recheck CXR to evaluate left base, no change in catapres as it was just started yesterday

## 2013-07-25 NOTE — Progress Notes (Signed)
Gave 12.5 mg phenergan in 9cc ns ivp for c/o nausea, gave 1 mg dilaudid ivp for c/o 7/10 abd pain. Pt had several visitors today, looks much better but still requiring frequent meds.

## 2013-07-25 NOTE — Progress Notes (Signed)
The patient still having pain and nausea, but seems improved from yesterday.  Not taking much PO.  Ambulating.    Filed Vitals:    07/25/13 0719   BP: 158/90   Pulse: 86   Temp: 99 ??F (37.2 ??C)   Resp: 19     Abd:  Soft nt/nd      AP:  S/p stent for small gastric leak  UGI looks good from yesterday.  Stent in place and contrast goes through easily.  Try to wean to oral pain medications  Liquids as tolerated

## 2013-07-25 NOTE — Progress Notes (Signed)
Patient in bed awake alert,breathing easily.Nausea better per pt.Complained of abdominal pain 7/10-dilaudid 1 mg IVP given see MAR,other needs attended, call light in reach

## 2013-07-25 NOTE — Progress Notes (Signed)
Gave 12.5 mg phenergan in 9cc ns for c/o nausea, gave 1 mg dilaudid ivp for pain. pepcid IVPB hung late as it was previously unavailable from pharmacy.

## 2013-07-26 LAB — POCT GLUCOSE
POC Glucose: 160 mg/dl — ABNORMAL HIGH (ref 70–99)
POC Glucose: 153 mg/dl — ABNORMAL HIGH (ref 70–99)
POC Glucose: 90 mg/dl (ref 70–99)
POC Glucose: 92 mg/dl (ref 70–99)
POC Glucose: 137 mg/dl — ABNORMAL HIGH (ref 70–99)
POC Glucose: 77 mg/dl (ref 70–99)

## 2013-07-26 MED ORDER — OXYCODONE-ACETAMINOPHEN 5-325 MG/5ML PO SOLN
5-325 | ORAL | Status: DC | PRN
Start: 2013-07-26 — End: 2013-08-02
  Administered 2013-07-26: 16:00:00 5 mL via ORAL

## 2013-07-26 MED ADMIN — ondansetron (ZOFRAN) injection 4 mg: INTRAVENOUS | @ 20:00:00 | NDC 23155037831

## 2013-07-26 MED ADMIN — ALPRAZolam (XANAX) tablet 0.5 mg: ORAL | @ 02:00:00 | NDC 68084067211

## 2013-07-26 MED ADMIN — ampicillin-sulbactam (UNASYN) 1.5 g in sodium chloride 0.9 % 50 mL IVPB: INTRAVENOUS | @ 06:00:00 | NDC 00264180031

## 2013-07-26 MED ADMIN — insulin lispro (HUMALOG) injection pen 0-12 Units: SUBCUTANEOUS | @ 01:00:00 | NDC 00002879901

## 2013-07-26 MED ADMIN — citalopram (CELEXA) tablet 40 mg: ORAL | @ 13:00:00 | NDC 68084074411

## 2013-07-26 MED ADMIN — promethazine (PHENERGAN) injection 12.5 mg: INTRAVENOUS | @ 10:00:00 | NDC 00641092821

## 2013-07-26 MED ADMIN — PN-Adult Premix 5/20 - Standard Electrolytes - Central Line: INTRAVENOUS | @ 22:00:00 | NDC 63323014397

## 2013-07-26 MED ADMIN — ampicillin-sulbactam (UNASYN) 1.5 g in sodium chloride 0.9 % 50 mL IVPB: INTRAVENOUS | @ 21:00:00 | NDC 00264180031

## 2013-07-26 MED ADMIN — promethazine (PHENERGAN) injection 12.5 mg: INTRAVENOUS | @ 02:00:00 | NDC 00641092821

## 2013-07-26 MED ADMIN — famotidine premixed 20mg in NS 50mL ivpb: INTRAVENOUS | @ 01:00:00 | NDC 00338519741

## 2013-07-26 MED ADMIN — ampicillin-sulbactam (UNASYN) 1.5 g in sodium chloride 0.9 % 50 mL IVPB: INTRAVENOUS | @ 13:00:00 | NDC 00264180031

## 2013-07-26 MED ADMIN — ondansetron (ZOFRAN) injection 4 mg: INTRAVENOUS | @ 04:00:00 | NDC 23155037831

## 2013-07-26 MED ADMIN — enoxaparin (LOVENOX) injection 40 mg: SUBCUTANEOUS | @ 13:00:00 | NDC 00075801401

## 2013-07-26 MED ADMIN — famotidine premixed 20mg in NS 50mL ivpb: INTRAVENOUS | @ 14:00:00 | NDC 00338519741

## 2013-07-26 MED ADMIN — dextrose 5 % in lactated ringers infusion: INTRAVENOUS | @ 21:00:00 | NDC 00338012504

## 2013-07-26 MED ADMIN — dextrose 5 % in lactated ringers infusion: INTRAVENOUS | @ 10:00:00 | NDC 00338012504

## 2013-07-26 MED ADMIN — oxyCODONE-acetaminophen (ROXICET) 5-325 MG/5ML solution 10 mL: 10 mL | ORAL | @ 20:00:00 | NDC 00054864816

## 2013-07-26 MED ADMIN — HYDROmorphone HCl PF (DILAUDID) injection SOLN 1 mg: 1 mg | INTRAVENOUS | @ 10:00:00 | NDC 00409128331

## 2013-07-26 MED ADMIN — HYDROmorphone HCl PF (DILAUDID) injection SOLN 1 mg: 1 mg | INTRAVENOUS | @ 14:00:00 | NDC 00409128331

## 2013-07-26 MED ADMIN — HYDROmorphone HCl PF (DILAUDID) injection SOLN 1 mg: 1 mg | INTRAVENOUS | @ 04:00:00 | NDC 00409128331

## 2013-07-26 MED FILL — NORMAL SALINE FLUSH 0.9 % IV SOLN: 0.9 % | INTRAVENOUS | Qty: 10

## 2013-07-26 MED FILL — NORMAL SALINE FLUSH 0.9 % IV SOLN: 0.9 % | INTRAVENOUS | Qty: 20

## 2013-07-26 MED FILL — ONDANSETRON HCL 4 MG/2ML IJ SOLN: 4 MG/2ML | INTRAMUSCULAR | Qty: 2

## 2013-07-26 MED FILL — FAMOTIDINE PREMIXED 20-0.9 MG/50ML-% IV SOLN: INTRAVENOUS | Qty: 1

## 2013-07-26 MED FILL — HYDROMORPHONE HCL PF 1 MG/ML IJ SOLN: 1 MG/ML | INTRAMUSCULAR | Qty: 1

## 2013-07-26 MED FILL — PROMETHAZINE HCL 25 MG/ML IJ SOLN: 25 MG/ML | INTRAMUSCULAR | Qty: 1

## 2013-07-26 MED FILL — CITALOPRAM HYDROBROMIDE 20 MG PO TABS: 20 MG | ORAL | Qty: 2

## 2013-07-26 MED FILL — UNASYN 1.5 (1-0.5) G IJ SOLR: 1.5 (1-0.5) g | INTRAMUSCULAR | Qty: 1.5

## 2013-07-26 MED FILL — LOVENOX 40 MG/0.4ML SC SOLN: 40 MG/0.4ML | SUBCUTANEOUS | Qty: 0.4

## 2013-07-26 MED FILL — DEXTROSE IN LACTATED RINGERS 5 % IV SOLN: 5 % | INTRAVENOUS | Qty: 1000

## 2013-07-26 MED FILL — ALPRAZOLAM 0.5 MG PO TABS: 0.5 MG | ORAL | Qty: 1

## 2013-07-26 MED FILL — CLINIMIX E/DEXTROSE (5/20) 5 % IV SOLN: 5 % | INTRAVENOUS | Qty: 2000

## 2013-07-26 MED FILL — ROXICET 5-325 MG/5ML PO SOLN: 5-325 MG/5ML | ORAL | Qty: 5

## 2013-07-26 MED FILL — ROXICET 5-325 MG/5ML PO SOLN: 5-325 MG/5ML | ORAL | Qty: 10

## 2013-07-26 NOTE — Progress Notes (Signed)
Assessment complete, VSS, BS hypoactive, pt states she is passing flatus abd burping, small amt of drainage noted on abd dressing, pt c/o heartburn and nausea, will give zofran when due and scheduled pepcid, see MAR, encouraged pt to ambulate, pt voiced understanding, family at the bedside, call light and belongings in reach, will continue monitoring.  Ranelle OysterAllison Renee Celestina Gironda

## 2013-07-26 NOTE — Progress Notes (Signed)
The patient still having pain and nausea.  She clinically appears very comfortable.  Ambulating  Filed Vitals:    07/27/13 0710   BP: 147/77   Pulse: 78   Temp: 98.3 ??F (36.8 ??C)   Resp: 16     Abd: soft nt/nd  Wound looks good      AP: stent in place  Wean off IV pain meds  Can have liquids as tolerated.  Increase activity

## 2013-07-26 NOTE — Progress Notes (Signed)
RN needs to change IVF bag,patient was asleep,now awakens and complained of abdominal pain 8/10 and nausea.PICC line both lumens good blood return.Dilaudid and phenergan IVP given per pt request,no further needs at this time, call light in reach

## 2013-07-26 NOTE — Consults (Signed)
NUTRITION THERAPY ASSESSMENT     Admission date:07/22/2013      Timepoint:f/u assessment     Rachael Mays is a 57 y.o. female admitted d/t had esophageal stent placed yesterday presents with uncontrolled pain and nausea and vomiting, unable to swallow; She was recently admitted 7/30 for gastric band erosion requiring removal. She was started on PN during that admission. Has been on nightly TPN at home.     RD consult for PN. Currently NPO on PN @ 40 ml/hr.     8/20:  Pt is tolerating small amounts of clear liquids.  Cyclic PN tolerated well @ goal.     OBJECTIVE DATA  Patient Active Problem List    Diagnosis Date Noted   ??? HTN (hypertension) 07/24/2013   ??? Hyperglycemia 07/24/2013   ??? Wound infection (HCC) 07/22/2013   ??? Complication of gastric banding 07/05/2013   ??? 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 Vertigo 04/11/2013   ??? Dizziness 03/29/2013   ??? 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 Morbid obesity 02/21/2009   ??? Back Pain 01/23/2009   ??? Deep Vein Thrombosis 01/23/2009   ??? Depression 01/23/2009   ??? High Cholesterol 01/23/2009   ??? Hypertension 01/23/2009   ??? Pulmonary Embolism 01/23/2009   ??? Nausea & vomiting 01/23/2009   ??? Abdominal  pain, other specified site 01/23/2009     Past Medical History   Diagnosis Date   ??? Anxiety    ??? Asthma    ??? Chronic back pain    ??? Deep vein thrombosis    ??? Depression    ??? GERD (gastroesophageal reflux disease)      NO LONGER SINCE LAP   ??? Pulmonary embolism (HCC)    ??? Obesity      hx of; had lap band   ??? Hyperlipidemia      hx; resolved with lap band   ??? GERD (gastroesophageal reflux disease) 01/23/2009   ??? Hypertension        Labs  Recent Labs      07/24/13   0631  07/25/13   0550   NA  134*  137   K  3.6  3.5   CL  100  101   CO2  26  28   BUN  17  16   CREATININE  0.5*  0.6   GLUCOSE  121*  108*     Lab Results   Component Value Date    PHOS 3.2 07/25/2013       No results found for this basename: AST, ALT, ALB, BILIDIR, BILITOT, ALKPHOS,  in  the last 72 hours  Lab Results   Component Value Date    LABALBU 3.8 07/22/2013      Lab Results   Component Value Date    TRIG 102 07/24/2013    HDL 60 06/30/2013    HDL 52 03/31/2011    LDLCALC 138 06/30/2013    LABVLDL 19 06/30/2013     No results found for this basename: LIPASE,  in the last 72 hours  Lab Results   Component Value Date    LABA1C 5.2 03/26/2010       Medications  Continuous Medications:      ??? PN-Adult Premix 5/20 - Standard Electrolytes - Central Line     ??? dextrose 5% in lactated ringers 100 mL/hr at 07/26/13 0620   ??? dextrose     ??? fat emulsion 250 mL (07/24/13 1805)  Scheduled Medications:   ??? enoxaparin  40 mg Subcutaneous Daily   ??? cloNIDine  1 patch Transdermal Weekly   ??? insulin lispro  0-12 Units Subcutaneous Q4H [facility]   ??? citalopram  40 mg Oral Daily   ??? sodium chloride flush  10 mL Intravenous Q12H [facility]   ??? famotidine  20 mg Intravenous Q12H [facility]   ??? ampicillin-sulbactam  1.5 g Intravenous Q8H       Anthropometric Measures   Height: 5\' 6"  (167.6 cm)   Current Weight: Weight: 240 lb 8.4 oz (109.1 kg)   Admission weight: 220 lb (99.791 kg)  Weight Method: Actual (standing scale)  If applicable: n/a  Usual Body Weight:  lb       Ideal Body Weight: 130 lb      % Ideal Body Weight: 169 %  Adjusted Body Weight: 69 lb    Body mass index is 38.84 kg/(m^2).   Classified as:  Less than 18.5 Underweight  18.5-24.9 Normal Weight  25-29.9 Overweight  30-34.9 Obese Class I  35-39.9 Obese Class II  Greater than or equal to 40 Obese Class III    Patient Vitals for the past 96 hrs (Last 3 readings):   Weight   07/26/13 0635 240 lb 8.4 oz (109.1 kg)   07/25/13 0618 225 lb (102.059 kg)   07/24/13 0733 226 lb (102.513 kg)     Wt Readings from Last 50 Encounters:   07/26/13 240 lb 8.4 oz (109.1 kg)   07/20/13 205 lb (92.987 kg)   07/09/13 236 lb (107.049 kg)   06/29/13 231 lb (104.781 kg)   06/12/13 225 lb (102.059 kg)   06/07/13 228 lb 8 oz (103.647 kg)   05/30/13 229 lb (103.874 kg)   05/22/13 230 lb 8 oz  (104.554 kg)   05/19/13 231 lb 11.3 oz (105.1 kg)   05/18/13 225 lb (102.059 kg)   05/18/13 232 lb (105.235 kg)   04/27/13 229 lb 3.2 oz (103.964 kg)   04/17/13 230 lb (104.327 kg)   04/03/13 231 lb (104.781 kg)   03/29/13 228 lb 8 oz (103.647 kg)   03/10/13 227 lb 8 oz (103.193 kg)   02/23/13 227 lb 9.6 oz (103.239 kg)   12/28/12 217 lb (98.431 kg)   12/15/12 216 lb (97.977 kg)   10/20/12 213 lb (96.616 kg)   06/24/12 212 lb (96.163 kg)   06/02/12 210 lb (95.255 kg)   05/31/12 208 lb 3.2 oz (94.439 kg)   05/19/12 209 lb (94.802 kg)   04/14/12 211 lb (95.709 kg)   03/17/12 213 lb (96.616 kg)   03/16/12 214 lb (97.07 kg)   12/23/11 223 lb (101.152 kg)   11/14/11 220 lb (99.791 kg)   11/07/11 220 lb (99.791 kg)   11/03/11 226 lb (102.513 kg)   05/15/11 233 lb 12.8 oz (106.051 kg)   04/08/10 240 lb (108.863 kg)   04/01/10 240 lb (108.863 kg)   03/26/10 241 lb 2 oz (109.374 kg)   02/06/10 248 lb 3 oz (112.577 kg)   11/07/09 243 lb (110.224 kg)   10/23/09 235 lb (106.595 kg)   08/07/09 246 lb (111.585 kg)   05/02/09 242 lb 5 oz (109.912 kg)   02/21/09 245 lb (111.131 kg)   01/23/09 244 lb 4 oz (110.791 kg)   11/19/08 243 lb 9 oz (110.479 kg)       Comparative Standards   Weight Used: adjusted bodyweight 69 kg   Estimated Calorie Needs: 1610-9604  kcal (based on 25- 30 kcal/kg)  Estimated Protein Needs: 83-104g (based on 1.2- 1.5gm/kg)  Estimated Fluid Needs: 1725-2070 ml (based on 1 mL/kcal)    Nutrition-focused Physical Findings           Emesis: Emesis Appearance: Green (minimal amount)   Stool:      none reported  Skin/Edema:  Skin Integrity (WDL): Exceptions to WDL (dressing on L side of abdomen)           Chewing / Swallowing:  Can't swallow s/p esoph. Stent placed 8/14.   Mental Status / Barriers:  no issues reported    Food/Nutrition-Related History  Pre-Admission / Home Diet:  Pre-Admission/Home Diet: NPO unknown  Home Supplements / Herbals:    none noted  Food Restrictions / Cultural Requests:    none  noted  Allergies as of 07/22/2013 - Review Complete 07/22/2013   Allergen Reaction Noted   ??? Nystatin Other (See Comments) 03/29/2013   ??? Talwin [pentazocine] Other (See Comments) 03/29/2013       PO Diet Orders  Current diet order: DIET CLEAR LIQUID;  PN-Adult Premix 5/20 - Standard Electrolytes - Central Line   Current supplement order:  none  Feeding Assistance:        Feeding:  (NPO)    Room Service: selective  Nursing Recorded PO Intake: PO Meals Eaten (%): 51 - 75%   Patient / Family Reported PO Intake: n/a  Observed PO Intake: n/a  Pain does not affect PO intake.    Tube Feeding Order / Parenteral Order  Cyclic PN:  50 ml taper plus 190 ml/hr x 10 hr      Intake/Output Summary (Last 24 hours) at 07/26/13 1402  Last data filed at 07/26/13 16100622   Gross per 24 hour   Intake   3058 ml   Output   1100 ml   Net   1958 ml       Intake vs. Needs: adequate with PN @ goal    NUTRITION DIAGNOSIS and GOAL  Nutrition Deficit Risk.   ?? Problem: altered GI function  ?? Etiology/related to: NPO  ?? Symptoms/Signs/as evidenced by: on HPN; s/p esoph stent and diff swallowing; had lap band removal 2weeks ago.      Goal: patient will tolerate PN @ goal rate or diet adv to meet nutrition needs.   ?? Initiated: 08/16       ?? Progress: 8/20: PN cyclic @ goal  ?? Resolved: N/A    EDUCATION  n/a      MALNUTRITION  Per AND/ASPEN guidelines, patient does not meet criteria for malnutrition.    INTERVENTION HISTORY  08/16 initial assessment  8/20 f/u assessment      NUTRITION RECOMMENDATIONS / PRESCRIPTION / MONITORING / EVALUATION  1.  PN @ goal rate 83 ml/hr continuous to meet 100% of needs. This provides 1755 kcal, 100 g protein and 2.76 dex load.   2. Please start lipids Mon 8/18 as patient has been on long-term PN. Obtain current TG levels. Do not give if TG > 400  3. If desire to transition PN to cyclic PM, recommend 50 ml/hr taper up/down first and last hour; 190 ml/hr x 10 hours.   4. Monitor for advancement of PN to goal rate and  patient tolerance.   5. Monitor tolerance to diet as advanced beyond clears.    Pt is at mod-high nutritional risk. Will follow up in 3-5 days to monitor PN advanced to goal rate to meet 100% of  needs.     Moksh Loomer R Corabelle Spackman RD, LD  Pager: [phone]

## 2013-07-26 NOTE — Progress Notes (Signed)
Pt c/o continued nausea, gave IV phenergan, see MAR.  Rachael Mays

## 2013-07-26 NOTE — Progress Notes (Signed)
[facility]  Gastroenterology Progress Note    Rachael Mays is a 57 y.o. female patient.  1. Abdominal pain    2. Nausea and vomiting        SUBJECTIVE:  Still nausea and pain.  States regurgitated orange jello and popsickle yesterday.    ROS:  Cardiovascular ROS: positive for - chest pain  negative for - rapid heart rate or shortness of breath  Gastrointestinal ROS: positive for - abdominal pain, constipation and nausea/vomiting  negative for - diarrhea or melena  Respiratory ROS: no cough, shortness of breath, or wheezing    Physical    VITALS:  BP 138/77   Pulse 83   Temp(Src) 98 ??F (36.7 ??C) (Oral)   Resp 16   Ht 5\' 6"  (1.676 m)   Wt 240 lb 8.4 oz (109.1 kg)   BMI 38.84 kg/m2   SpO2 98%  TEMPERATURE:  Current - Temp: 98 ??F (36.7 ??C); Max - Temp  Avg: 98.5 ??F (36.9 ??C)  Min: 97.9 ??F (36.6 ??C)  Max: 99.4 ??F (37.4 ??C)    NAD  RRR, Nl s1s2  Lungs CTA Bilaterally, normal effort  Abdomen soft, ND, subjective tenderness in epigastrium unchanged, no HSM, Bowel sounds normal  AAOx3, No asterixis     Data    CBC:   Lab Results   Component Value Date    WBC 12.2 07/24/2013    RBC 4.59 07/24/2013    HGB 12.7 07/24/2013    HCT 39.1 07/24/2013    MCV 85.0 07/24/2013    MCH 27.7 07/24/2013    MCHC 32.5 07/24/2013    RDW 14.3 07/24/2013    PLT 383 07/24/2013    MPV 8.2 07/24/2013     Hepatic Function Panel:    Lab Results   Component Value Date    ALKPHOS 127 07/22/2013    ALT 53 07/22/2013    AST 40 07/22/2013    PROT 8.5 07/22/2013    PROT 7.3 03/31/2011    BILITOT 0.5 07/22/2013    BILIDIR 0.3 07/22/2013    IBILI 0.2 07/22/2013         ASSESSMENT :  Chest pain, Nausea and vomiting s/p Esophageal Stent placement - suspect result of stent in place. No apparent complication of stent on CT or UGI so would leave in for 2 - 4 weeks in hopes of closing gastric fistula.   Instructed her to sit in chair when taking po and immediately stand and walk after eating/drinking to see if avoids regurgitation.    PLAN :   1) Clear liquids as tolerated.       2)  Attempt to transition from V Dilaudid, Zofran and Phenergan to po roxicet liquid, Zofran SL/Phenergan tabs for symptom relief.     3) Protonix and Pepcid for now.     4) Continue TPN.    Kandice Robinsons, MD  [facility]  07/26/2013

## 2013-07-26 NOTE — Progress Notes (Signed)
Gave zofran for nausea, see MAR.  Sheliah Fiorillo Renee Deanthony Maull

## 2013-07-26 NOTE — Progress Notes (Signed)
Pt ambulated in the hallway with family 2 large laps, tolerated well.  Ranelle OysterAllison Renee Peretz Thieme

## 2013-07-26 NOTE — Progress Notes (Signed)
Department of Family Practice  Adult Daily Progress Note      SUBJECTIVE  Still with nausea and abd pain.  No emesis since last PM.  BP elevated  But not critical.    OBJECTIVE    Physical    BP 138/77   Pulse 83   Temp(Src) 98 ??F (36.7 ??C) (Oral)   Resp 16   Ht 5\' 6"  (1.676 m)   Wt 240 lb 8.4 oz (109.1 kg)   BMI 38.84 kg/m2   SpO2 98%         CONSTITUTIONAL:   alert  CHEST:  clear to auscultation bilaterally  CARDIOVASCULAR:  Cor RRR  ABDOMEN:  soft and normal bowel sounds  EXTREMITIES:  none edema bilateral    Data    CBC:   Lab Results   Component Value Date    WBC 12.2 07/24/2013    RBC 4.59 07/24/2013    HGB 12.7 07/24/2013    HCT 39.1 07/24/2013    MCV 85.0 07/24/2013    MCH 27.7 07/24/2013    MCHC 32.5 07/24/2013    RDW 14.3 07/24/2013    PLT 383 07/24/2013    MPV 8.2 07/24/2013     BMP:    Lab Results   Component Value Date    NA 137 07/25/2013    K 3.5 07/25/2013    CL 101 07/25/2013    CO2 28 07/25/2013    BUN 16 07/25/2013    LABALBU 3.8 07/22/2013    CREATININE 0.6 07/25/2013    CREATININE 0.8 05/15/2011    CALCIUM 8.9 07/25/2013    GFRAA >60 07/25/2013    GFRAA >60 03/16/2012    LABGLOM >60 07/25/2013    GLUCOSE 108 07/25/2013     PT/INR:    Lab Results   Component Value Date    PROTIME 10.9 05/15/2011    INR 0.95 05/15/2011       Labs & X-rays results reviewed      ASSESSMENT AND PLAN    Principal Problem:    Nausea & vomiting  Active Problems:    Abdominal  pain, other specified site    Morbid obesity    Meniere syndrome    Complication of gastric banding    Wound infection (HCC)    HTN (hypertension)    Hyperglycemia        Will restart BP meds when tolerating diet  Cont IV fluids

## 2013-07-26 NOTE — Plan of Care (Signed)
Problem: Falls - Risk of  Intervention: Fall risk assessment  Pt moderate risk for falls  Intervention: Fall precautions      Patient placed on fall Precautions per Morse Fall Risk Assessment Scale. Fall risk armband on, orange blanket placed on foot of bed, S.A.F.E. sign displayed at door. Bed in locked and in lowest position, bed alarm armed and audible, call light and bedside table in reach. Patient acknowledges the need to call before getting out of bed.        Goal: Absence of falls  Outcome: Ongoing  Pt will remain free from falls

## 2013-07-26 NOTE — Progress Notes (Signed)
1 mg dilaudid given for pain of 9. No other needs voiced. Call light in reach. Will monitor.

## 2013-07-26 NOTE — Progress Notes (Signed)
5 mg roxicet given for pain of 10.Marland Kitchen Pt laying in bed. Call light in reach. Will monitor.

## 2013-07-26 NOTE — Progress Notes (Signed)
10 mg roxicet given for pain of 10. Encouraged pt to get up and walk and sit in chair. Pt verbalizes understanding. Call light in reach. Will monitor.

## 2013-07-26 NOTE — Progress Notes (Signed)
Assessment completed and documented. Pt sitting up on cell phone. JP draining clear, yellow fluid. Pt claims to have had BM last night. Call light i reach; will monitor.

## 2013-07-27 LAB — BASIC METABOLIC PANEL
BUN: 19 mg/dL (ref 7–20)
CO2: 28 mmol/L (ref 21–32)
Calcium: 8.7 mg/dL (ref 8.3–10.6)
Chloride: 103 mmol/L (ref 99–110)
Creatinine: 0.6 mg/dL (ref 0.6–1.1)
GFR African American: >60 (ref >60)
GFR Non-African American: >60 (ref >60)
Glucose: 104 mg/dL — ABNORMAL HIGH (ref 70–99)
Potassium: 3.5 mmol/L (ref 3.5–5.1)
Sodium: 138 mmol/L (ref 136–145)

## 2013-07-27 LAB — POCT GLUCOSE
POC Glucose: 131 mg/dl — ABNORMAL HIGH (ref 70–99)
POC Glucose: 134 mg/dl — ABNORMAL HIGH (ref 70–99)
POC Glucose: 141 mg/dl — ABNORMAL HIGH (ref 70–99)
POC Glucose: 94 mg/dl (ref 70–99)
POC Glucose: 105 mg/dl — ABNORMAL HIGH (ref 70–99)
POC Glucose: 91 mg/dl (ref 70–99)

## 2013-07-27 LAB — PHOSPHORUS: Phosphorus: 3.5 mg/dL (ref 2.5–4.9)

## 2013-07-27 LAB — MAGNESIUM: Magnesium: 2.1 mg/dL (ref 1.80–2.40)

## 2013-07-27 MED ORDER — DEXTROSE-SODIUM CHLORIDE 5-0.45 % IV SOLN
5-0.45 | INTRAVENOUS | Status: DC
Start: 2013-07-27 — End: 2013-07-28
  Administered 2013-07-27 (×2): via INTRAVENOUS

## 2013-07-27 MED ADMIN — ampicillin-sulbactam (UNASYN) 1.5 g in sodium chloride 0.9 % 50 mL IVPB: INTRAVENOUS | @ 21:00:00 | NDC 00264180031

## 2013-07-27 MED ADMIN — promethazine (PHENERGAN) tablet 12.5 mg: ORAL | @ 21:00:00 | NDC 68084015511

## 2013-07-27 MED ADMIN — insulin lispro (HUMALOG) injection pen 0-12 Units: SUBCUTANEOUS | @ 08:00:00 | NDC 00002879901

## 2013-07-27 MED ADMIN — PN-Adult Premix 5/20 - Standard Electrolytes - Central Line: INTRAVENOUS | @ 22:00:00 | NDC 00338112504

## 2013-07-27 MED ADMIN — ampicillin-sulbactam (UNASYN) 1.5 g in sodium chloride 0.9 % 50 mL IVPB: INTRAVENOUS | @ 15:00:00 | NDC 00264180031

## 2013-07-27 MED ADMIN — citalopram (CELEXA) tablet 40 mg: ORAL | @ 13:00:00 | NDC 68084074411

## 2013-07-27 MED ADMIN — promethazine (PHENERGAN) injection 12.5 mg: INTRAVENOUS | @ 11:00:00 | NDC 00641092821

## 2013-07-27 MED ADMIN — famotidine premixed 20mg in NS 50mL ivpb: INTRAVENOUS | @ 02:00:00 | NDC 00338519741

## 2013-07-27 MED ADMIN — dextrose 5 % in lactated ringers infusion: INTRAVENOUS | @ 07:00:00 | NDC 00338012504

## 2013-07-27 MED ADMIN — ampicillin-sulbactam (UNASYN) 1.5 g in sodium chloride 0.9 % 50 mL IVPB: INTRAVENOUS | @ 06:00:00 | NDC 00264180031

## 2013-07-27 MED ADMIN — pantoprazole (PROTONIX) injection 40 mg: INTRAVENOUS | @ 14:00:00 | NDC 00008400101

## 2013-07-27 MED ADMIN — enoxaparin (LOVENOX) injection 40 mg: SUBCUTANEOUS | @ 13:00:00 | NDC 00075801401

## 2013-07-27 MED ADMIN — sodium chloride flush 0.9 % injection 10 mL: INTRAVENOUS | @ 01:00:00

## 2013-07-27 MED ADMIN — ondansetron (ZOFRAN) injection 4 mg: INTRAVENOUS | @ 02:00:00 | NDC 23155037831

## 2013-07-27 MED ADMIN — sodium chloride flush 0.9 % injection 10 mL: INTRAVENOUS | @ 14:00:00

## 2013-07-27 MED ADMIN — promethazine (PHENERGAN) injection 12.5 mg: INTRAVENOUS | @ 03:00:00 | NDC 00641092821

## 2013-07-27 MED ADMIN — ondansetron (ZOFRAN-ODT) disintegrating tablet 8 mg: ORAL | @ 14:00:00 | NDC 00378773493

## 2013-07-27 MED ADMIN — fat emulsion 20 % infusion 250 mL: INTRAVENOUS | @ 23:00:00 | NDC 00338051902

## 2013-07-27 MED ADMIN — ondansetron (ZOFRAN) injection 4 mg: INTRAVENOUS | @ 08:00:00 | NDC 23155037831

## 2013-07-27 MED ADMIN — oxyCODONE-acetaminophen (ROXICET) 5-325 MG/5ML solution 10 mL: 10 mL | ORAL | @ 08:00:00 | NDC 00054864816

## 2013-07-27 MED ADMIN — oxyCODONE-acetaminophen (ROXICET) 5-325 MG/5ML solution 10 mL: 10 mL | ORAL | @ 13:00:00 | NDC 00054864816

## 2013-07-27 MED ADMIN — oxyCODONE-acetaminophen (ROXICET) 5-325 MG/5ML solution 10 mL: 10 mL | ORAL | @ 01:00:00 | NDC 00054864816

## 2013-07-27 MED FILL — ONDANSETRON HCL 4 MG/2ML IJ SOLN: 4 MG/2ML | INTRAMUSCULAR | Qty: 2

## 2013-07-27 MED FILL — UNASYN 1.5 (1-0.5) G IJ SOLR: 1.5 (1-0.5) g | INTRAMUSCULAR | Qty: 1.5

## 2013-07-27 MED FILL — NORMAL SALINE FLUSH 0.9 % IV SOLN: 0.9 % | INTRAVENOUS | Qty: 10

## 2013-07-27 MED FILL — ROXICET 5-325 MG/5ML PO SOLN: 5-325 MG/5ML | ORAL | Qty: 10

## 2013-07-27 MED FILL — INTRALIPID 20 % IV EMUL: 20 % | INTRAVENOUS | Qty: 250

## 2013-07-27 MED FILL — PROMETHAZINE HCL 25 MG/ML IJ SOLN: 25 MG/ML | INTRAMUSCULAR | Qty: 1

## 2013-07-27 MED FILL — LOVENOX 40 MG/0.4ML SC SOLN: 40 MG/0.4ML | SUBCUTANEOUS | Qty: 0.4

## 2013-07-27 MED FILL — FAMOTIDINE PREMIXED 20-0.9 MG/50ML-% IV SOLN: INTRAVENOUS | Qty: 1

## 2013-07-27 MED FILL — PROTONIX 40 MG IV SOLR: 40 MG | INTRAVENOUS | Qty: 40

## 2013-07-27 MED FILL — CITALOPRAM HYDROBROMIDE 20 MG PO TABS: 20 MG | ORAL | Qty: 2

## 2013-07-27 MED FILL — DEXTROSE-NACL 5-0.45 % IV SOLN: INTRAVENOUS | Qty: 1000

## 2013-07-27 MED FILL — CLINIMIX E/DEXTROSE (5/20) 5 % IV SOLN: 5 % | INTRAVENOUS | Qty: 2000

## 2013-07-27 MED FILL — PROMETHAZINE HCL 25 MG PO TABS: 25 MG | ORAL | Qty: 1

## 2013-07-27 MED FILL — DEXTROSE IN LACTATED RINGERS 5 % IV SOLN: 5 % | INTRAVENOUS | Qty: 1000

## 2013-07-27 MED FILL — ONDANSETRON 8 MG PO TBDP: 8 MG | ORAL | Qty: 1

## 2013-07-27 NOTE — Progress Notes (Signed)
Handoff received from [employer]. Introduced self to pt. All questions answered.

## 2013-07-27 NOTE — Progress Notes (Signed)
12.5 mg phenergan given for nausea.. Family in  Room. Call light in reahc. Will monitor.

## 2013-07-27 NOTE — Progress Notes (Signed)
10 mg roxicet given for pain of 9... Will monitor. Call lgiht in reach

## 2013-07-27 NOTE — Progress Notes (Signed)
Assessment complete, VSS, BS active, pt is passing flatus, replace abd dressing gauze and covered with tegaderm so pt can get washed up this evening, moderate amt of serosanguinous drainage noted on dry dressing, iodoform packing remains in place, will change later tonight, husband at the bedside, call light and belongings in reach, will continue monitoring.  Ranelle OysterAllison Renee Jalen Oberry

## 2013-07-27 NOTE — Progress Notes (Signed)
Phenergan given for nausea and heartburn, see MAR.  Ranelle OysterAllison Renee Handsome Anglin

## 2013-07-27 NOTE — Plan of Care (Signed)
Problem: Pain  Goal: Reduction in pain sensation  Outcome: Ongoing  Encouraged pt to rate pain on 0-10 scale pain controled with roxicet. And progressive ambulation

## 2013-07-27 NOTE — Progress Notes (Signed)
Changed abd dressing/packing, repacked with iodoform, covered with dry dressing, changed drain sponge, pt tolerated well, pt states she feels like she is getting spasms in her throat, states they come and go, declines pain med at this time, family at the bedside, will continue monitoring.  Ranelle OysterAllison Renee Peace Noyes

## 2013-07-27 NOTE — Progress Notes (Addendum)
[facility]  Gastroenterology Progress Note    Rachael Mays is a 57 y.o. female patient.  1. Abdominal pain    2. Nausea and vomiting        SUBJECTIVE:  Still nausea and pain. Has been getting roxicet instead of IV dilaudid.  Had some clear liquids.     ROS:  Cardiovascular ROS: positive for - chest pain  negative for - rapid heart rate or shortness of breath  Gastrointestinal ROS: positive for - abdominal pain, constipation and nausea/vomiting  negative for - diarrhea or melena  Respiratory ROS: no cough, shortness of breath, or wheezing    Physical    VITALS:  BP 147/77   Pulse 78   Temp(Src) 98.3 ??F (36.8 ??C) (Oral)   Resp 16   Ht 5\' 6"  (1.676 m)   Wt 231 lb 14.4 oz (105.189 kg)   BMI 37.45 kg/m2   SpO2 100%  TEMPERATURE:  Current - Temp: 98.3 ??F (36.8 ??C); Max - Temp  Avg: 98.4 ??F (36.9 ??C)  Min: 98 ??F (36.7 ??C)  Max: 99 ??F (37.2 ??C)    NAD  RRR, Nl s1s2  Lungs CTA Bilaterally, normal effort  Abdomen soft, ND, subjective tenderness in epigastrium unchanged, no HSM, Bowel sounds normal  AAOx3, No asterixis     Data    CBC:   Lab Results   Component Value Date    WBC 12.2 07/24/2013    RBC 4.59 07/24/2013    HGB 12.7 07/24/2013    HCT 39.1 07/24/2013    MCV 85.0 07/24/2013    MCH 27.7 07/24/2013    MCHC 32.5 07/24/2013    RDW 14.3 07/24/2013    PLT 383 07/24/2013    MPV 8.2 07/24/2013     Hepatic Function Panel:    Lab Results   Component Value Date    ALKPHOS 127 07/22/2013    ALT 53 07/22/2013    AST 40 07/22/2013    PROT 8.5 07/22/2013    PROT 7.3 03/31/2011    BILITOT 0.5 07/22/2013    BILIDIR 0.3 07/22/2013    IBILI 0.2 07/22/2013         ASSESSMENT :  Chest pain, Nausea and vomiting s/p Esophageal Stent placement - suspect result of stent in place. No apparent complication of stent on CT or UGI so would leave in for 2 - 4 weeks in hopes of closing gastric fistula.   Instructed her to sit in chair when taking po and immediately stand and walk after eating/drinking to see if avoids regurgitation.    PLAN :   1) Clear liquids as  tolerated.       2) Transitioning from IV Dilaudid, Zofran and Phenergan to po roxicet liquid, Zofran SL/Phenergan tabs for symptom relief.     3) Protonix (ordered) and Pepcid for now.     4) Continue TPN.    Soyla Dryer, PA-C  [facility]     GI STAFF  I have personally performed a face to face diagnostic evaluation on this patient.  I have interviewed and examined the patient and I agree with the findings and recommended plan of care.  In summary, my findings and plan are the following: She is tolerating liquids including mashed potatoes and milkshakes in small amounts.  Abd soft and nontender.  Will change to po meds and advance diet today.  Would plan discharge in AM if tolerates po.  Below is "Stent diet"  Nutrition Guidelines Following Esophageal Stent Placement    Why Do I Need This Diet  After your stent placement, this diet is necessary to make foods easy to swallow. The texture of your food needs to be altered to a moist/soft consistency so foods will go down your esophagus with ease.    General Guidelines  ??? Chew all food thoroughly.  ??? Eat 5-6 small meals per day if needed.  ??? Eat slowly and take small bites.  ??? Sit upright while eating.  ??? Drink fluids in between meals if you feel ???full??? with meals.  ??? Remain in an upright position at least 30-60 minutes after eating.  ??? Foods should always be prepared so that they are moist, soft, and easily swallowed.  ??? If food ever feels ???stuck??? in your throat take a couple sips of Coca-Cola?? (not Pepsi??)* This may                                                     help dislodge food from your esophagus. You may want to repeat this throughout the day, especially before and after each meal.  ??? If you are having trouble maintaining your weight, you may need to drink nutritional supplements (see below) or homemade milkshakes as snacks/meal replacements. If you need ideas, ask to  meet with the ???GI??? nutritionist.    Food  Group    Yes Foods     Avoid  Milk and Dairy Products   Milk--all kinds    Ice cream or yogurt with chunks of  Yogurt, custard, ice cream         fruit or nuts  Soft or melted cheese  Cottage cheese, cream cheese    Meat and Meat Substitutes   Soft eggs    Dry poultry  Tofu     Peanut butter  Casseroles    All tough red and white meats  Moist Fish  Strained baby meats (for easy preparation)  All other meats must be bite-size or ground--  suggest adding a gravy or sauce.    Fruits     All juices     Fresh fruits with skins--plums,  All canned fruits         peaches, oranges, apricots  Fresh fruits peeled--bananas   Dried fruits  Stewed dried fruits.  Strained baby fruits    Vegetables    Well-cooked soft or pureed   Raw vegetables  Should be ???fork-tender???  Strained baby vegetables    Bread and Starchy Foods  Cooked cereal     Hard bread with thick crust  Mashed potatoes, sweet potatoes/yams  Dry cereals without milk  Baked potato without skin    Potato chips  Soft, moist rice     Popcorn  Noodles, macaroni, spaghetti    Crackers  Dry cereals softened in milk  Pancakes softened with syrup/butter  Waffles softened with syrup/butter  Crackers or breads added to soups    Fats     Butter, margarine, mayonnaise  Bacon  Salad dressings    Nuts  Gravy     Deep fried, crispy food  Cream: sour, whipping, coffee    Desserts     Sherbet, ice cream, [place] ice, frozen  Cookies  Gelatin, puddings, mousse, custard  Pie crust  All cake type desserts  Meal Ideas      Breakfast  ??? Cereal softened with whole milk, canned fruit. Orange juice to drink.  ??? Scrambled eggs made with cheese and butter. Coffee to drink (creamer and sugar).  ??? Oatmeal made with whole milk, yogurt, and banana. Grape juice to drink.  ??? Pancakes or [place] toast with butter, syrup, or fruit sauce. Orange juice to drink.      Lunch  ??? Creamed or vegetable soup, applesauce with cinnamon and sugar. Ice tea to drink.  ??? Egg salad/tuna salad on soft, crust-less,  buttered bread, melon. Lemonade to drink.  ??? Ground beef/pork with gravy, rice, and creamed corn or cooked/soft carrots. Milk to drink.  ??? Pasta or potato salad, soup or stew, canned/soft fruit. Water to drink.      Dinner  ??? Ground chicken with gravy, mashed potatoes with butter/cheese, soft green beans. Milk to drink.  ??? Macaroni and cheese, ???bite-sized??? hot dog with ketchup/mustard, baked beans. Ice tea to drink.  ??? Spaghetti with ground meat sauce, soft cooked vegetables with cheese sauce. Water to drink.  ??? Quiche or omelet made with cheese, spinach, or other cooked vegetables. Lemonade to drink.            *Try drinking sips of Coke if food feels stuck!       Kandice Robinsons, MD  [facility]  07/27/2013

## 2013-07-27 NOTE — Progress Notes (Signed)
Assessment completed and documented. Pt claims to have had BM this AM. Good bowel sounds. Had discussion regarding walking in halls and Drs orders to sit up to eat and walk afterwards. Call light in reach. Will monitor.

## 2013-07-27 NOTE — Progress Notes (Signed)
Pt c/o 10/10 abd pain, Nikki RN gave roxicet, see MAR.  Rachael Mays

## 2013-07-27 NOTE — Progress Notes (Signed)
Shift re-assessment complete, see flowsheet. POC discussed with pt. Pt verbalizes understanding. Denies any needs at this time. Call light in reach, family at the bedside.

## 2013-07-27 NOTE — Progress Notes (Signed)
Able to flush pt PICC, no bloos return will call MD for cathflo and notify lab to drawn pt this AM.  Ranelle OysterAllison Mays Rachael Mays

## 2013-07-27 NOTE — Progress Notes (Signed)
Department of Family Practice  Adult Daily Progress Note      SUBJECTIVE  Still with nausea and abd pain.  Emesis this AM.  BP elevated  but not critical.    OBJECTIVE    Physical    BP 147/77   Pulse 78   Temp(Src) 98.3 ??F (36.8 ??C) (Oral)   Resp 16   Ht 5\' 6"  (1.676 m)   Wt 231 lb 14.4 oz (105.189 kg)   BMI 37.45 kg/m2   SpO2 100%         CONSTITUTIONAL:   alert  CHEST:  clear to auscultation bilaterally  CARDIOVASCULAR:  Cor RRR  ABDOMEN:  soft and normal bowel sounds  EXTREMITIES:  none edema bilateral    Data    CBC:   Lab Results   Component Value Date    WBC 12.2 07/24/2013    RBC 4.59 07/24/2013    HGB 12.7 07/24/2013    HCT 39.1 07/24/2013    MCV 85.0 07/24/2013    MCH 27.7 07/24/2013    MCHC 32.5 07/24/2013    RDW 14.3 07/24/2013    PLT 383 07/24/2013    MPV 8.2 07/24/2013     BMP:    Lab Results   Component Value Date    NA 137 07/25/2013    K 3.5 07/25/2013    CL 101 07/25/2013    CO2 28 07/25/2013    BUN 16 07/25/2013    LABALBU 3.8 07/22/2013    CREATININE 0.6 07/25/2013    CREATININE 0.8 05/15/2011    CALCIUM 8.9 07/25/2013    GFRAA >60 07/25/2013    GFRAA >60 03/16/2012    LABGLOM >60 07/25/2013    GLUCOSE 108 07/25/2013     PT/INR:    Lab Results   Component Value Date    PROTIME 10.9 05/15/2011    INR 0.95 05/15/2011       Labs & X-rays results reviewed      ASSESSMENT AND PLAN    Principal Problem:    Nausea & vomiting  Active Problems:    Abdominal  pain, other specified site    Morbid obesity    Meniere syndrome    Complication of gastric banding    Wound infection (HCC)    HTN (hypertension)    Hyperglycemia        Will restart BP meds when tolerating diet  Change IV fluids to D5.45  Diet per GI and surgery

## 2013-07-27 NOTE — Progress Notes (Signed)
Pt also c/o nausea, gave zofran, see MA.  Rachael OysterAllison Mays Rachael Mays

## 2013-07-27 NOTE — Progress Notes (Signed)
Gave Roxicet for abd pain, see MAR.  Ranelle OysterAllison Renee Celine Dishman

## 2013-07-28 LAB — POCT GLUCOSE
POC Glucose: 88 mg/dl (ref 70–99)
POC Glucose: 139 mg/dl — ABNORMAL HIGH (ref 70–99)
POC Glucose: 150 mg/dl — ABNORMAL HIGH (ref 70–99)
POC Glucose: 137 mg/dl — ABNORMAL HIGH (ref 70–99)
POC Glucose: 73 mg/dl (ref 70–99)
POC Glucose: 94 mg/dl (ref 70–99)
POC Glucose: 97 mg/dl (ref 70–99)

## 2013-07-28 LAB — BASIC METABOLIC PANEL
BUN: 15 mg/dL (ref 7–20)
CO2: 27 mmol/L (ref 21–32)
Calcium: 8.3 mg/dL (ref 8.3–10.6)
Chloride: 101 mmol/L (ref 99–110)
Creatinine: 0.6 mg/dL (ref 0.6–1.1)
GFR African American: >60 (ref >60)
GFR Non-African American: >60 (ref >60)
Glucose: 152 mg/dL — ABNORMAL HIGH (ref 70–99)
Potassium: 3.1 mmol/L — ABNORMAL LOW (ref 3.5–5.1)
Sodium: 135 mmol/L — ABNORMAL LOW (ref 136–145)

## 2013-07-28 MED ORDER — POTASSIUM CHLORIDE 20 MEQ/15ML (10%) PO LIQD
20 | Freq: Two times a day (BID) | ORAL | Status: DC
Start: 2013-07-28 — End: 2013-07-28

## 2013-07-28 MED ORDER — KCL IN DEXTROSE-NACL 20-5-0.45 MEQ/L-%-% IV SOLN
20-5-0.45 | INTRAVENOUS | Status: DC
Start: 2013-07-28 — End: 2013-08-02
  Administered 2013-07-28 – 2013-08-02 (×6): via INTRAVENOUS

## 2013-07-28 MED ADMIN — pantoprazole (PROTONIX) injection 40 mg: INTRAVENOUS | @ 14:00:00 | NDC 00008400101

## 2013-07-28 MED ADMIN — sodium chloride flush 0.9 % injection 10 mL: INTRAVENOUS | @ 02:00:00

## 2013-07-28 MED ADMIN — pantoprazole (PROTONIX) tablet 40 mg: ORAL | @ 20:00:00 | NDC 68084065711

## 2013-07-28 MED ADMIN — ampicillin-sulbactam (UNASYN) 1.5 g in sodium chloride 0.9 % 50 mL IVPB: INTRAVENOUS | @ 05:00:00 | NDC 00264180031

## 2013-07-28 MED ADMIN — PN-Adult Premix 5/20 - Standard Electrolytes - Central Line: INTRAVENOUS | @ 22:00:00 | NDC 63323014397

## 2013-07-28 MED ADMIN — magnesium hydroxide (MILK OF MAGNESIA) 400 MG/5ML suspension 30 mL: ORAL | @ 04:00:00 | NDC 00121043130

## 2013-07-28 MED ADMIN — promethazine (PHENERGAN) tablet 12.5 mg: ORAL | @ 10:00:00 | NDC 68084015511

## 2013-07-28 MED ADMIN — scopolamine (TRANSDERM-SCOP) 1.5 MG 1 patch: TRANSDERMAL | @ 04:00:00 | NDC 10019055388

## 2013-07-28 MED ADMIN — citalopram (CELEXA) tablet 40 mg: ORAL | @ 14:00:00 | NDC 68084074411

## 2013-07-28 MED ADMIN — ALPRAZolam (XANAX) tablet 0.5 mg: ORAL | @ 06:00:00 | NDC 68084067211

## 2013-07-28 MED ADMIN — ampicillin-sulbactam (UNASYN) 1.5 g in sodium chloride 0.9 % 50 mL IVPB: INTRAVENOUS | @ 14:00:00 | NDC 00264180031

## 2013-07-28 MED ADMIN — enoxaparin (LOVENOX) injection 40 mg: SUBCUTANEOUS | @ 14:00:00 | NDC 00075801401

## 2013-07-28 MED ADMIN — sucralfate (CARAFATE) tablet 1 g: ORAL | @ 20:00:00 | NDC 68084059301

## 2013-07-28 MED ADMIN — pantoprazole (PROTONIX) injection 40 mg: INTRAVENOUS | @ 02:00:00 | NDC 00008400101

## 2013-07-28 MED ADMIN — promethazine (PHENERGAN) tablet 12.5 mg: ORAL | @ 05:00:00 | NDC 68084015511

## 2013-07-28 MED ADMIN — ampicillin-sulbactam (UNASYN) 1.5 g in sodium chloride 0.9 % 50 mL IVPB: INTRAVENOUS | @ 22:00:00 | NDC 00264180031

## 2013-07-28 MED ADMIN — oxyCODONE-acetaminophen (ROXICET) 5-325 MG/5ML solution 10 mL: 10 mL | ORAL | @ 05:00:00 | NDC 00054864816

## 2013-07-28 MED ADMIN — oxyCODONE-acetaminophen (ROXICET) 5-325 MG/5ML solution 10 mL: 10 mL | ORAL | @ 01:00:00 | NDC 00054864816

## 2013-07-28 MED ADMIN — oxyCODONE-acetaminophen (ROXICET) 5-325 MG/5ML solution 10 mL: 10 mL | ORAL | @ 10:00:00 | NDC 00054864816

## 2013-07-28 MED ADMIN — oxyCODONE-acetaminophen (ROXICET) 5-325 MG/5ML solution 10 mL: 10 mL | ORAL | @ 19:00:00 | NDC 00054864816

## 2013-07-28 MED FILL — PROMETHAZINE HCL 25 MG PO TABS: 25 MG | ORAL | Qty: 1

## 2013-07-28 MED FILL — CLINIMIX E/DEXTROSE (5/20) 5 % IV SOLN: 5 % | INTRAVENOUS | Qty: 2000

## 2013-07-28 MED FILL — NORMAL SALINE FLUSH 0.9 % IV SOLN: 0.9 % | INTRAVENOUS | Qty: 10

## 2013-07-28 MED FILL — PROTONIX 40 MG IV SOLR: 40 MG | INTRAVENOUS | Qty: 40

## 2013-07-28 MED FILL — LOVENOX 40 MG/0.4ML SC SOLN: 40 MG/0.4ML | SUBCUTANEOUS | Qty: 0.4

## 2013-07-28 MED FILL — ROXICET 5-325 MG/5ML PO SOLN: 5-325 MG/5ML | ORAL | Qty: 10

## 2013-07-28 MED FILL — MILK OF MAGNESIA 7.75 % PO SUSP: 7.75 % | ORAL | Qty: 30

## 2013-07-28 MED FILL — SUCRALFATE 1 G PO TABS: 1 GM | ORAL | Qty: 1

## 2013-07-28 MED FILL — UNASYN 1.5 (1-0.5) G IJ SOLR: 1.5 (1-0.5) g | INTRAMUSCULAR | Qty: 1.5

## 2013-07-28 MED FILL — DEXTROSE-NACL 5-0.45 % IV SOLN: INTRAVENOUS | Qty: 1000

## 2013-07-28 MED FILL — KCL IN DEXTROSE-NACL 20-5-0.45 MEQ/L-%-% IV SOLN: INTRAVENOUS | Qty: 1000

## 2013-07-28 MED FILL — CITALOPRAM HYDROBROMIDE 20 MG PO TABS: 20 MG | ORAL | Qty: 2

## 2013-07-28 MED FILL — PANTOPRAZOLE SODIUM 40 MG PO TBEC: 40 MG | ORAL | Qty: 1

## 2013-07-28 MED FILL — TRANSDERM-SCOP (1.5 MG) 1 MG/3DAYS TD PT72: 1 MG/3DAYS | TRANSDERMAL | Qty: 1

## 2013-07-28 MED FILL — ALPRAZOLAM 0.5 MG PO TABS: 0.5 MG | ORAL | Qty: 1

## 2013-07-28 NOTE — Progress Notes (Signed)
Pain still with some pain, but seems to be comfortable and greatly improved.  Still having nausea and not taking in much PO    Filed Vitals:    07/28/13 0722   BP: 126/74   Pulse: 76   Temp: 98 ??F (36.7 ??C)   Resp: 16   ]     Abd: soft nt/nd wound looks good.      AP:  S/p stent placement with nausea and pain related to the stent    Ok for liquids and soft diet as tolerated.  On oral pain mediation and pain appears to be well controlled.    Can d/c home when tolerating liquids/soft food.      Will follow

## 2013-07-28 NOTE — Progress Notes (Signed)
Department of Family Practice  Adult Daily Progress Note      SUBJECTIVE  Still with nausea and abd pain.  Emesis this AM.  Potassium level low.    OBJECTIVE    Physical    BP 126/74   Pulse 76   Temp(Src) 98 ??F (36.7 ??C) (Oral)   Resp 16   Ht 5\' 6"  (1.676 m)   Wt 246 lb 4.1 oz (111.7 kg)   BMI 39.77 kg/m2   SpO2 96%         CONSTITUTIONAL:   alert  CHEST:  clear to auscultation bilaterally  CARDIOVASCULAR:  Cor RRR  ABDOMEN:  soft and normal bowel sounds  EXTREMITIES:  none edema bilateral    Data    CBC:   Lab Results   Component Value Date    WBC 12.2 07/24/2013    RBC 4.59 07/24/2013    HGB 12.7 07/24/2013    HCT 39.1 07/24/2013    MCV 85.0 07/24/2013    MCH 27.7 07/24/2013    MCHC 32.5 07/24/2013    RDW 14.3 07/24/2013    PLT 383 07/24/2013    MPV 8.2 07/24/2013     BMP:    Lab Results   Component Value Date    NA 135 07/28/2013    K 3.1 07/28/2013    CL 101 07/28/2013    CO2 27 07/28/2013    BUN 15 07/28/2013    LABALBU 3.8 07/22/2013    CREATININE 0.6 07/28/2013    CREATININE 0.8 05/15/2011    CALCIUM 8.3 07/28/2013    GFRAA >60 07/28/2013    GFRAA >60 03/16/2012    LABGLOM >60 07/28/2013    GLUCOSE 152 07/28/2013     PT/INR:    Lab Results   Component Value Date    PROTIME 10.9 05/15/2011    INR 0.95 05/15/2011       Labs & X-rays results reviewed      ASSESSMENT AND PLAN    Principal Problem:    Nausea & vomiting  Active Problems:    Abdominal  pain, other specified site    Morbid obesity    Meniere syndrome    Complication of gastric banding    Wound infection (HCC)    HTN (hypertension)    Hyperglycemia        Will add potassium oral and change IV  Change IV fluids to D5.45  Diet per GI and surgery  Home when tolerating diet better

## 2013-07-28 NOTE — Progress Notes (Signed)
Pt c/o reflux t/o afternoon. Contacted Dr Suzi Roots regarding. Order for po prilosec 40 mg bid and order for sucralfate noted.

## 2013-07-28 NOTE — Other (Signed)
07/28/13 0000   Cardiac   Telemetry Monitor On No   Wound 07/22/13 Surgical dehiscence Abdomen Lower;Left   Date First Assessed/Time First Assessed: 07/22/13 1537   Wound Type: Surgical dehiscence  Wound Event: Surgical  Location: Abdomen  Wound Location Orientation: Lower;Left  Pre-existing: Yes   Peri-wound Assessment Dry;Pink   Peri-Wound Moisture Bleeding;Painful   Peri-Wound Color Pink   Assessment Granulation tissue   Dressing Status Old drainage   Dressing Changed Changed/New   Dressing/Treatment Packing;Dry dressing;Tegaderm   Drainage Amount Small   Drainage Description Serosanguinous   Odor None   Open Drain Midline Abdomen   Placement Date/Time: 07/22/13 0829   Timeout: Patient  Orientation: Midline  Location: Abdomen   Site Description Healing   Dressing Status Old drainage;Other (Comment)  (New dressing applied)   Drainage Appearance Yellow   Pain Assessment   Clinical Progression Not changed   Response to Pain Intervention Patient Satisfied   Dressings changed as ordered, pt tolerated well. Denies any needs at this time. Call light in reach, bed alarm on, will continue to monitor.

## 2013-07-28 NOTE — Progress Notes (Signed)
Assessment completed and documented. JP draining yellow clear fluid. TPN infusing. Husband bedside. Call light i reach. Will monitor.  No needs at this time

## 2013-07-28 NOTE — Telephone Encounter (Signed)
Liquid Potassium was ordered for pt this morning. Pt states this makes her vomit & requesting for alternative. States she will take the pill form. Please call Kathlene NovemberMike

## 2013-07-28 NOTE — Progress Notes (Signed)
Dr Suzi Roots in to see pt, pt reported continuing nausea. No new orders at this time. Pt is awake alert and oriented. Reports good sleep overnight. states pain has been adequately controlled with po meds. Able to follow commands t/o assessment.

## 2013-07-28 NOTE — Progress Notes (Signed)
Pt requesting Xanax, medicated per pt request (See MAR). Pt with no c/o n/v at this time. States she would like to "try and rest" positioned for comfort. Denies any needs at this time. Call light in reach, bed alarm on, will continue to monitor.

## 2013-07-28 NOTE — Progress Notes (Signed)
Notified by nursing that pt may want to change home care agencies.  Attempted to meet with pt to discuss this.  Pt states she cannot get her head together.  Asked that social worker come back later.  Will meet with pt as time permits. [name]

## 2013-07-28 NOTE — Plan of Care (Signed)
Problem: Falls - Risk of  Intervention: Fall risk assessment  Pt low risk for falls  Intervention: Toileting assistance  Pt will remain independent    Goal: Absence of falls  Outcome: Ongoing  Pt will remain free from falls

## 2013-07-28 NOTE — Progress Notes (Signed)
[facility]  Notified per Nat/nurse manager pt request another home care agency. Susan/discharge planner notified.

## 2013-07-28 NOTE — Progress Notes (Signed)
Pt refusing liquid potassium stating it makes her vomit. States she would be willing to take in pill form. Call placed to Dr Tyler Aas to report this. Awaiting callback.

## 2013-07-28 NOTE — Progress Notes (Signed)
[facility]  Gastroenterology Progress Note    Rachael Mays is a 57 y.o. female patient.  Principal Problem:    Nausea & vomiting  Active Problems:    Abdominal  pain, other specified site    Morbid obesity    Meniere syndrome    Complication of gastric banding    Wound infection (HCC)    HTN (hypertension)    Hyperglycemia      SUBJECTIVE:  Feeling better.  Eating chips and other food in small amounts.  No vomiting.     ROS:  Denies cough, SOB.    Gastrointestinal ROS: positive for - abdominal pain, appetite loss and nausea  negative for - diarrhea, hematemesis or melena    Physical    VITALS:  BP 134/75   Pulse 68   Temp(Src) 98.3 ??F (36.8 ??C) (Oral)   Resp 18   Ht 5\' 6"  (1.676 m)   Wt 246 lb 4.1 oz (111.7 kg)   BMI 39.77 kg/m2   SpO2 96%  TEMPERATURE:  Current - Temp: 98.3 ??F (36.8 ??C); Max - Temp  Avg: 98.6 ??F (37 ??C)  Min: 98 ??F (36.7 ??C)  Max: 99 ??F (37.2 ??C)    NAD  RRR, Nl s1s2  Lungs CTA Bilaterally, normal effort  Abdomen soft, ND, NT, no HSM, Bowel sounds normal.    Data    Data Review:    No results found for this basename: WBC, HGB, HCT, MCV, PLT,  in the last 72 hours  Recent Labs      07/27/13   0802  07/28/13   0609   NA  138  135*   K  3.5  3.1*   CL  103  101   CO2  28  27   PHOS  3.5   --    BUN  19  15   CREATININE  0.6  0.6     No results found for this basename: AST, ALT, ALB, BILIDIR, BILITOT, ALKPHOS,  in the last 72 hours  No results found for this basename: LIPASE, AMYLASE,  in the last 72 hours  No results found for this basename: PROTIME, INR,  in the last 72 hours  No results found for this basename: PTT,  in the last 72 hours          ASSESSMENT :  Chest pain, Nausea and vomiting s/p Esophageal Stent placement - suspect result of stent in place. No apparent complication of stent on CT or UGI so would leave in for 2 - 4 weeks in hopes of closing gastric fistula. She is improved and tolerating po.      PLAN :   1) Soft diet as tolerated.      2) Continue roxicet liquid, Zofran SL/Phenergan  tabs for symptom relief.     3) Protonix (ordered) and Pepcid for now.     4) TPN per Dr. Suzi Roots.      5) Repeat EGD in 2 - 4 weeks for stent removal and repeat UGI.   I gave patient my card to schedule.     6) OK to discharge from GI standpoint.    Kandice Robinsons, MD  [facility]  07/28/2013

## 2013-07-28 NOTE — Telephone Encounter (Signed)
Called patient and spoke with her husband yesterday. I offered her an appt for today. Husband said that she is currently in the hospital, so she will keep her appt for 08/09/2013. I removed her from the waiting list.

## 2013-07-28 NOTE — Progress Notes (Signed)
Pt resting in bed with eyes closed. VSS. No s/s of distress. Breathing is easy and unlabored. Call light in reach, will continue to monitor

## 2013-07-29 LAB — BASIC METABOLIC PANEL
BUN: 16 mg/dL (ref 7–20)
CO2: 27 mmol/L (ref 21–32)
Calcium: 8.4 mg/dL (ref 8.3–10.6)
Chloride: 103 mmol/L (ref 99–110)
Creatinine: 0.6 mg/dL (ref 0.6–1.1)
GFR African American: >60 (ref >60)
GFR Non-African American: >60 (ref >60)
Glucose: 112 mg/dL — ABNORMAL HIGH (ref 70–99)
Potassium: 4 mmol/L (ref 3.5–5.1)
Sodium: 135 mmol/L — ABNORMAL LOW (ref 136–145)

## 2013-07-29 LAB — POCT GLUCOSE
POC Glucose: 110 mg/dl — ABNORMAL HIGH (ref 70–99)
POC Glucose: 136 mg/dl — ABNORMAL HIGH (ref 70–99)
POC Glucose: 154 mg/dl — ABNORMAL HIGH (ref 70–99)
POC Glucose: 172 mg/dl — ABNORMAL HIGH (ref 70–99)
POC Glucose: 86 mg/dl (ref 70–99)
POC Glucose: 88 mg/dl (ref 70–99)

## 2013-07-29 MED ADMIN — citalopram (CELEXA) tablet 40 mg: ORAL | @ 14:00:00 | NDC 68084074411

## 2013-07-29 MED ADMIN — insulin lispro (HUMALOG) injection pen 0-12 Units: SUBCUTANEOUS | @ 01:00:00 | NDC 00002879901

## 2013-07-29 MED ADMIN — ampicillin-sulbactam (UNASYN) 1.5 g in sodium chloride 0.9 % 50 mL IVPB: INTRAVENOUS | @ 14:00:00 | NDC 00264180031

## 2013-07-29 MED ADMIN — ondansetron (ZOFRAN) injection 4 mg: INTRAVENOUS | @ 21:00:00 | NDC 23155037831

## 2013-07-29 MED ADMIN — promethazine (PHENERGAN) tablet 12.5 mg: ORAL | @ 18:00:00 | NDC 68084015511

## 2013-07-29 MED ADMIN — promethazine (PHENERGAN) tablet 12.5 mg: ORAL | @ 08:00:00 | NDC 68084015511

## 2013-07-29 MED ADMIN — sucralfate (CARAFATE) tablet 1 g: ORAL | @ 20:00:00 | NDC 68084059301

## 2013-07-29 MED ADMIN — pantoprazole (PROTONIX) tablet 40 mg: ORAL | @ 20:00:00 | NDC 68084065711

## 2013-07-29 MED ADMIN — promethazine (PHENERGAN) tablet 12.5 mg: ORAL | @ 22:00:00 | NDC 68084015511

## 2013-07-29 MED ADMIN — sucralfate (CARAFATE) tablet 1 g: ORAL | @ 14:00:00 | NDC 68084059301

## 2013-07-29 MED ADMIN — insulin lispro (HUMALOG) injection pen 0-12 Units: SUBCUTANEOUS | @ 08:00:00 | NDC 00002879901

## 2013-07-29 MED ADMIN — enoxaparin (LOVENOX) injection 40 mg: SUBCUTANEOUS | @ 14:00:00 | NDC 00075801401

## 2013-07-29 MED ADMIN — ampicillin-sulbactam (UNASYN) 1.5 g in sodium chloride 0.9 % 50 mL IVPB: INTRAVENOUS | @ 05:00:00 | NDC 00264180031

## 2013-07-29 MED ADMIN — sucralfate (CARAFATE) tablet 1 g: ORAL | @ 02:00:00 | NDC 68084059301

## 2013-07-29 MED ADMIN — promethazine (PHENERGAN) tablet 12.5 mg: ORAL | @ 14:00:00 | NDC 68084015511

## 2013-07-29 MED ADMIN — sucralfate (CARAFATE) tablet 1 g: ORAL | @ 08:00:00 | NDC 68084059301

## 2013-07-29 MED ADMIN — ampicillin-sulbactam (UNASYN) 1.5 g in sodium chloride 0.9 % 50 mL IVPB: INTRAVENOUS | @ 21:00:00 | NDC 00264180031

## 2013-07-29 MED ADMIN — pantoprazole (PROTONIX) tablet 40 mg: ORAL | @ 10:00:00 | NDC 68084065711

## 2013-07-29 MED ADMIN — PN-Adult Premix 5/20 - Standard Electrolytes - Central Line: INTRAVENOUS | @ 22:00:00 | NDC 00338112504

## 2013-07-29 MED ADMIN — oxyCODONE-acetaminophen (ROXICET) 5-325 MG/5ML solution 10 mL: 10 mL | ORAL | @ 01:00:00 | NDC 00054864816

## 2013-07-29 MED ADMIN — oxyCODONE-acetaminophen (ROXICET) 5-325 MG/5ML solution 10 mL: 10 mL | ORAL | @ 08:00:00 | NDC 00054864816

## 2013-07-29 MED ADMIN — HYDROmorphone HCl PF (DILAUDID) injection SOLN 1 mg: 1 mg | INTRAVENOUS | NDC 00409128331

## 2013-07-29 MED FILL — LOVENOX 40 MG/0.4ML SC SOLN: 40 MG/0.4ML | SUBCUTANEOUS | Qty: 0.4

## 2013-07-29 MED FILL — SUCRALFATE 1 G PO TABS: 1 GM | ORAL | Qty: 1

## 2013-07-29 MED FILL — UNASYN 1.5 (1-0.5) G IJ SOLR: 1.5 (1-0.5) g | INTRAMUSCULAR | Qty: 1.5

## 2013-07-29 MED FILL — PROMETHAZINE HCL 25 MG PO TABS: 25 MG | ORAL | Qty: 1

## 2013-07-29 MED FILL — CITALOPRAM HYDROBROMIDE 20 MG PO TABS: 20 MG | ORAL | Qty: 2

## 2013-07-29 MED FILL — PANTOPRAZOLE SODIUM 40 MG PO TBEC: 40 MG | ORAL | Qty: 1

## 2013-07-29 MED FILL — ROXICET 5-325 MG/5ML PO SOLN: 5-325 MG/5ML | ORAL | Qty: 10

## 2013-07-29 MED FILL — NORMAL SALINE FLUSH 0.9 % IV SOLN: 0.9 % | INTRAVENOUS | Qty: 10

## 2013-07-29 MED FILL — ONDANSETRON HCL 4 MG/2ML IJ SOLN: 4 MG/2ML | INTRAMUSCULAR | Qty: 2

## 2013-07-29 MED FILL — HYDROMORPHONE HCL PF 1 MG/ML IJ SOLN: 1 MG/ML | INTRAMUSCULAR | Qty: 1

## 2013-07-29 MED FILL — KCL IN DEXTROSE-NACL 20-5-0.45 MEQ/L-%-% IV SOLN: INTRAVENOUS | Qty: 1000

## 2013-07-29 MED FILL — CLINIMIX E/DEXTROSE (5/20) 5 % IV SOLN: 5 % | INTRAVENOUS | Qty: 2000

## 2013-07-29 NOTE — Progress Notes (Signed)
Pt states little relief from zofran  Will give phenergan PO now that pt is not vomiting

## 2013-07-29 NOTE — Plan of Care (Signed)
Problem: Falls - Risk of  Goal: Absence of falls  Pt to remain free of falls during stay in the hospital. Bed locked in lowest position. Fall precautions initiated.    Problem: Pain  Goal: Reduction in pain sensation  Rn to assess pt's pain accordingly and will medicate with pain meds per md order.

## 2013-07-29 NOTE — Plan of Care (Signed)
Problem: Pain  Goal: Reduction in pain sensation  Outcome: Ongoing  Pt able to rate pain on scale 0-10 without difficulty. Pain managed with  repositioning to acceptable level per pt.

## 2013-07-29 NOTE — Progress Notes (Signed)
Department of Family Practice  Adult Daily Progress Note      SUBJECTIVE  Still with nausea and abd pain.  Emesis this AM.  Potassium level improved and BP stable    OBJECTIVE    Physical    BP 111/64   Pulse 71   Temp(Src) 98.3 ??F (36.8 ??C) (Oral)   Resp 16   Ht 5\' 6"  (1.676 m)   Wt 232 lb 6.4 oz (105.416 kg)   BMI 37.53 kg/m2   SpO2 97%         CONSTITUTIONAL:   alert  CHEST:  clear to auscultation bilaterally  CARDIOVASCULAR:  Cor RRR  ABDOMEN:  soft and normal bowel sounds  EXTREMITIES:  none edema bilateral    Data    CBC:   Lab Results   Component Value Date    WBC 12.2 07/24/2013    RBC 4.59 07/24/2013    HGB 12.7 07/24/2013    HCT 39.1 07/24/2013    MCV 85.0 07/24/2013    MCH 27.7 07/24/2013    MCHC 32.5 07/24/2013    RDW 14.3 07/24/2013    PLT 383 07/24/2013    MPV 8.2 07/24/2013     BMP:    Lab Results   Component Value Date    NA 135 07/29/2013    K 4.0 07/29/2013    CL 103 07/29/2013    CO2 27 07/29/2013    BUN 16 07/29/2013    LABALBU 3.8 07/22/2013    CREATININE 0.6 07/29/2013    CREATININE 0.8 05/15/2011    CALCIUM 8.4 07/29/2013    GFRAA >60 07/29/2013    GFRAA >60 03/16/2012    LABGLOM >60 07/29/2013    GLUCOSE 112 07/29/2013     PT/INR:    Lab Results   Component Value Date    PROTIME 10.9 05/15/2011    INR 0.95 05/15/2011       Labs & X-rays results reviewed      ASSESSMENT AND PLAN    Principal Problem:    Nausea & vomiting  Active Problems:    Abdominal  pain, other specified site    Morbid obesity    Meniere syndrome    Complication of gastric banding    Wound infection (HCC)    HTN (hypertension)    Hyperglycemia        Diet per GI and surgery  Home when tolerating diet better  No change of meds for now

## 2013-07-29 NOTE — Progress Notes (Signed)
12.5 mg phenergan PO given to pt for nausea and 10 mg roxicet given for pain of 8. Call light i reach. Will monitor.

## 2013-07-29 NOTE — Progress Notes (Signed)
Phenergan given for nausea  Pt states hasnt really eaten anything but wants to try once nausea subsides  Spitting up thick phlegm

## 2013-07-29 NOTE — Progress Notes (Signed)
zofran given  Will monitor

## 2013-07-29 NOTE — Progress Notes (Signed)
Spoke to Dr. Riley Lam concerning pt vomiting again unable to take in PO intake, zofran ordered, will try and monitor

## 2013-07-29 NOTE — Progress Notes (Signed)
Blood drawn and sent to lab' Pt tolerated well. No needs at this time. Call light in reach will montir.

## 2013-07-29 NOTE — Progress Notes (Signed)
Pt on bed, awake, alert, oriented x4, medicated w/ Dilaudid IV for complain of pain, unable to her po pain med. Assessment completed. Needs attended. Rn will continue to monitor pt. Call light within reach.

## 2013-07-29 NOTE — Progress Notes (Signed)
Met with pt again.  Pt now states she would like to continue using [facility] and [facility].  Asked pt to notify nursing if she changes her mind. Rachael Mays

## 2013-07-30 LAB — BASIC METABOLIC PANEL
BUN: 14 mg/dL (ref 7–20)
CO2: 27 mmol/L (ref 21–32)
Calcium: 8.5 mg/dL (ref 8.3–10.6)
Chloride: 103 mmol/L (ref 99–110)
Creatinine: 0.6 mg/dL (ref 0.6–1.1)
GFR African American: >60 (ref >60)
GFR Non-African American: >60 (ref >60)
Glucose: 134 mg/dL — ABNORMAL HIGH (ref 70–99)
Potassium: 3.7 mmol/L (ref 3.5–5.1)
Sodium: 137 mmol/L (ref 136–145)

## 2013-07-30 LAB — POCT GLUCOSE
POC Glucose: 104 mg/dl — ABNORMAL HIGH (ref 70–99)
POC Glucose: 107 mg/dl — ABNORMAL HIGH (ref 70–99)
POC Glucose: 127 mg/dl — ABNORMAL HIGH (ref 70–99)
POC Glucose: 180 mg/dl — ABNORMAL HIGH (ref 70–99)
POC Glucose: 76 mg/dl (ref 70–99)
POC Glucose: 92 mg/dl (ref 70–99)

## 2013-07-30 MED ORDER — MAGNESIUM HYDROXIDE 400 MG/5ML PO SUSP
400 MG/5ML | Freq: Every day | ORAL | Status: DC | PRN
Start: 2013-07-30 — End: 2014-03-12

## 2013-07-30 MED ORDER — SUCRALFATE 1 G PO TABS
1 GM | ORAL_TABLET | Freq: Four times a day (QID) | ORAL | Status: DC
Start: 2013-07-30 — End: 2014-04-20

## 2013-07-30 MED ORDER — CLONIDINE HCL 0.1 MG/24HR TD PTWK
0.1 MG/24HR | MEDICATED_PATCH | TRANSDERMAL | Status: DC
Start: 2013-07-30 — End: 2013-08-20

## 2013-07-30 MED ORDER — SCOPOLAMINE 1 MG/3DAYS TD PT72
1 MG/3DAYS | MEDICATED_PATCH | TRANSDERMAL | Status: DC
Start: 2013-07-30 — End: 2014-02-27

## 2013-07-30 MED ORDER — OXYCODONE-ACETAMINOPHEN 5-325 MG/5ML PO SOLN
5-325 MG/5ML | ORAL | Status: DC
Start: 2013-07-30 — End: 2013-09-14

## 2013-07-30 MED ORDER — PROMETHAZINE HCL 25 MG PO TABS
25 MG | ORAL_TABLET | Freq: Four times a day (QID) | ORAL | Status: DC | PRN
Start: 2013-07-30 — End: 2013-09-14

## 2013-07-30 MED ADMIN — sucralfate (CARAFATE) tablet 1 g: ORAL | @ 13:00:00 | NDC 68084059301

## 2013-07-30 MED ADMIN — citalopram (CELEXA) tablet 40 mg: ORAL | @ 13:00:00 | NDC 68084074411

## 2013-07-30 MED ADMIN — promethazine (PHENERGAN) tablet 12.5 mg: ORAL | @ 13:00:00 | NDC 68084015511

## 2013-07-30 MED ADMIN — insulin lispro (HUMALOG) injection pen 0-12 Units: SUBCUTANEOUS | @ 01:00:00 | NDC 00002879901

## 2013-07-30 MED ADMIN — promethazine (PHENERGAN) tablet 12.5 mg: ORAL | @ 09:00:00 | NDC 68084015511

## 2013-07-30 MED ADMIN — promethazine (PHENERGAN) tablet 12.5 mg: ORAL | @ 20:00:00 | NDC 68084015511

## 2013-07-30 MED ADMIN — ALPRAZolam (XANAX) tablet 0.5 mg: ORAL | @ 04:00:00 | NDC 68084067211

## 2013-07-30 MED ADMIN — sucralfate (CARAFATE) tablet 1 g: ORAL | @ 01:00:00 | NDC 68084059301

## 2013-07-30 MED ADMIN — sucralfate (CARAFATE) tablet 1 g: ORAL | @ 07:00:00 | NDC 68084059301

## 2013-07-30 MED ADMIN — ampicillin-sulbactam (UNASYN) 1.5 g in sodium chloride 0.9 % 50 mL IVPB: INTRAVENOUS | @ 04:00:00 | NDC 00264180031

## 2013-07-30 MED ADMIN — promethazine (PHENERGAN) tablet 12.5 mg: ORAL | @ 04:00:00 | NDC 68084015511

## 2013-07-30 MED ADMIN — pantoprazole (PROTONIX) tablet 40 mg: ORAL | @ 09:00:00 | NDC 00008060701

## 2013-07-30 MED ADMIN — ampicillin-sulbactam (UNASYN) 1.5 g in sodium chloride 0.9 % 50 mL IVPB: INTRAVENOUS | @ 13:00:00 | NDC 00264180031

## 2013-07-30 MED ADMIN — ampicillin-sulbactam (UNASYN) 1.5 g in sodium chloride 0.9 % 50 mL IVPB: INTRAVENOUS | @ 22:00:00 | NDC 00264180031

## 2013-07-30 MED ADMIN — PN-Adult Premix 5/20 - Standard Electrolytes - Central Line: INTRAVENOUS | @ 22:00:00 | NDC 00338112504

## 2013-07-30 MED ADMIN — enoxaparin (LOVENOX) injection 40 mg: SUBCUTANEOUS | @ 13:00:00 | NDC 00075801401

## 2013-07-30 MED ADMIN — ondansetron (ZOFRAN) injection 4 mg: INTRAVENOUS | @ 16:00:00 | NDC 23155037831

## 2013-07-30 MED ADMIN — oxyCODONE-acetaminophen (ROXICET) 5-325 MG/5ML solution 10 mL: 10 mL | ORAL | @ 09:00:00 | NDC 00054864816

## 2013-07-30 MED ADMIN — oxyCODONE-acetaminophen (ROXICET) 5-325 MG/5ML solution 10 mL: 10 mL | ORAL | @ 16:00:00 | NDC 00054864816

## 2013-07-30 MED ADMIN — HYDROmorphone HCl PF (DILAUDID) injection SOLN 1 mg: 1 mg | INTRAVENOUS | @ 04:00:00 | NDC 00409128331

## 2013-07-30 MED FILL — ONDANSETRON HCL 4 MG/2ML IJ SOLN: 4 MG/2ML | INTRAMUSCULAR | Qty: 2

## 2013-07-30 MED FILL — PROMETHAZINE HCL 25 MG PO TABS: 25 MG | ORAL | Qty: 1

## 2013-07-30 MED FILL — TRANSDERM-SCOP (1.5 MG) 1 MG/3DAYS TD PT72: 1 MG/3DAYS | TRANSDERMAL | Qty: 1

## 2013-07-30 MED FILL — SUCRALFATE 1 G PO TABS: 1 GM | ORAL | Qty: 1

## 2013-07-30 MED FILL — KCL IN DEXTROSE-NACL 20-5-0.45 MEQ/L-%-% IV SOLN: INTRAVENOUS | Qty: 1000

## 2013-07-30 MED FILL — ROXICET 5-325 MG/5ML PO SOLN: 5-325 MG/5ML | ORAL | Qty: 10

## 2013-07-30 MED FILL — NORMAL SALINE FLUSH 0.9 % IV SOLN: 0.9 % | INTRAVENOUS | Qty: 10

## 2013-07-30 MED FILL — CITALOPRAM HYDROBROMIDE 20 MG PO TABS: 20 MG | ORAL | Qty: 2

## 2013-07-30 MED FILL — HYDROMORPHONE HCL PF 1 MG/ML IJ SOLN: 1 MG/ML | INTRAMUSCULAR | Qty: 1

## 2013-07-30 MED FILL — ALPRAZOLAM 0.5 MG PO TABS: 0.5 MG | ORAL | Qty: 1

## 2013-07-30 MED FILL — LOVENOX 40 MG/0.4ML SC SOLN: 40 MG/0.4ML | SUBCUTANEOUS | Qty: 0.4

## 2013-07-30 MED FILL — UNASYN 1.5 (1-0.5) G IJ SOLR: 1.5 (1-0.5) g | INTRAMUSCULAR | Qty: 1.5

## 2013-07-30 MED FILL — CLINIMIX E/DEXTROSE (5/20) 5 % IV SOLN: 5 % | INTRAVENOUS | Qty: 2000

## 2013-07-30 MED FILL — PANTOPRAZOLE SODIUM 40 MG PO TBEC: 40 MG | ORAL | Qty: 1

## 2013-07-30 NOTE — Progress Notes (Signed)
Department of Family Practice  Adult Daily Progress Note      SUBJECTIVE      Feeling little better, still nausea but better, trying to eat some BF.    OBJECTIVE    Physical  SpO2: 96 %     Patient Vitals for the past 24 hrs:   BP Temp Temp src Pulse Resp SpO2 Weight   07/30/13 0653 151/77 mmHg 98.6 ??F (37 ??C) Oral 77 16 96 % -   07/30/13 0500 - - - - - - 233 lb 1.6 oz (105.733 kg)   07/30/13 0300 136/76 mmHg 98.5 ??F (36.9 ??C) Oral 65 16 - -   07/29/13 2215 135/76 mmHg 99 ??F (37.2 ??C) Oral 66 20 97 % -   07/29/13 1840 135/72 mmHg 100.2 ??F (37.9 ??C) Oral 58 16 96 % -   07/29/13 1420 156/77 mmHg 98.7 ??F (37.1 ??C) Oral 74 16 98 % -     Alert and oriented x 4 NAD, Obese, well hydrated, well developed.  Lung clear  CV rrr  No edema      Data    CBC:   Lab Results   Component Value Date    WBC 12.2 07/24/2013    RBC 4.59 07/24/2013    HGB 12.7 07/24/2013    HCT 39.1 07/24/2013    MCV 85.0 07/24/2013    MCH 27.7 07/24/2013    MCHC 32.5 07/24/2013    RDW 14.3 07/24/2013    PLT 383 07/24/2013    MPV 8.2 07/24/2013     BMP:    Lab Results   Component Value Date    NA 137 07/30/2013    K 3.7 07/30/2013    CL 103 07/30/2013    CO2 27 07/30/2013    BUN 14 07/30/2013    LABALBU 3.8 07/22/2013    CREATININE 0.6 07/30/2013    CREATININE 0.8 05/15/2011    CALCIUM 8.5 07/30/2013    GFRAA >60 07/30/2013    GFRAA >60 03/16/2012    LABGLOM >60 07/30/2013    GLUCOSE 134 07/30/2013       ASSESSMENT AND PLAN:    Principal Problem:    Nausea & vomiting  Active Problems:    Abdominal  pain, other specified site    Morbid obesity    Meniere syndrome    Complication of gastric banding    Wound infection (HCC)    HTN (hypertension)    Hyperglycemia    Home when able to tol eating w/o vomiting  BP well controlled  Sugars well controlled

## 2013-07-30 NOTE — Plan of Care (Signed)
Problem: Nausea/Vomiting  Goal: Absence of nausea/vomiting  Outcome: Ongoing  Nausea somewhat uncontrolled but phenergan and zforan provide some relief  Less vomiting today although little po intake so far

## 2013-07-30 NOTE — Progress Notes (Addendum)
Discharge Planning : called Terri at [facility], patient is active with their services.Spoke to patient nurse Leonarda Salon ) to get copy of AVS ready in.addition to COC will be on the floor to fax it to Terri at [facility], Jeannie Done RN CASE MANAGER      Faxed papers to Terri at [phone] and she confirmed receipt of the discharge papers. Notified the her that nurse said patient can be a possible discharge, Will call her befor 5.00 pm to let her know if patient is discharged. Will continue to be followed through discharge. Jeannie Done RN CASE MANAGER    Pt called and stated patient will discharge today around 4.00pm. Call ed Terri and notify her of the discharge time as discussed. Will continue to be followed through discharge. Jeannie Done RN CASE MANAGER    4.50 PM : Received a call from patient nurse about patient concerns about TPN tonight. Called [facility] to confirm and Sandi after hours coordinator said somebody will be out to hang the TPN tonight.     5.00pm Called patient's nurse and informed her [facility] response. She stated that patient said she does not have a phone tonight and will want to leave in AM. [facility] and left them a message.

## 2013-07-30 NOTE — Progress Notes (Signed)
Pt to be d/c'd today around 1600 when ride can pick her up  D/c planner notified who will notify home care company  abd dressing changed to JP drain which had small amount serosanguinous drainage on old dressing  LLQ dressing removed which was saturated with bloody drainage, packing removed, repacked with iodiform packing and new dry dressing applied and secured with tegaderm  PICC line dressing changed as well  Pt tolerated all well

## 2013-07-30 NOTE — Progress Notes (Signed)
roxicet given for pain  zofran given for nausea  Pt still has not vomited but still very nauseas, will monitor

## 2013-07-30 NOTE — Progress Notes (Signed)
No change in status. Pt resting at this time. Medicated w/ Roxicet po for pain and Phenergan for nausea. Will continue to monitor pt. Call light within reach.

## 2013-07-30 NOTE — Progress Notes (Signed)
Medicated pt w/ Dilaudid IV for pain, brought Roxicet po but unable to take it due to nausea.

## 2013-07-30 NOTE — Other (Signed)
07/30/13 0948   Provider Notification   Reason for Communication Evaluate   Provider Name [facility]   Provider Notification Physician   Method of Communication Call   Response Waiting for response   Notification Time [phone]   what would you like done with abdominal dressing/packing/jp drain upon discharge?    9:54 AM: spoke to Dr. Suzi Roots: leave JP in, dry dressing changes twice daily, pt has home health set up

## 2013-07-30 NOTE — Progress Notes (Addendum)
SW paged to ensure home care set up for impending possible d/c today, will wait for call back    10:04 AM: Spoke to [place] with discharge planning, she will call [facility] and call back with fax number to ensure care set up for possible d/c

## 2013-07-30 NOTE — Progress Notes (Addendum)
Morning assessment completed and medications given. Pain level stated at 0 at this time. Lungs clear bs active bm yesterday. Nausea persisting but with less vomiting last night. Phenergan given po now. Breakfast ordered will monitor for intake. Pt up independently to bathe and toilet. Abd dressing CDI with jp draining only small amount yellow-tinged clear drainage. POC discussed for the day and any concerns addressed. No further needs at this time. Call light in reach. Will continue to monitor  Shani Fitch A Lucendia Herrlich

## 2013-07-30 NOTE — Progress Notes (Signed)
Pt decided to stay tonight although feeling better, no one at home able to manage TPN, pt to leave tomorrow

## 2013-07-30 NOTE — Progress Notes (Signed)
Assessment completed. Dressing to abd dry and intact. Patient has +bowel sounds. Patient voiced concerns over being discharged in the morning. I told the patient I will speak with the dayshift nurse about her concerns and make sure discharge planning is completed.

## 2013-07-31 LAB — POCT GLUCOSE
POC Glucose: 173 mg/dl — ABNORMAL HIGH (ref 70–99)
POC Glucose: 135 mg/dl — ABNORMAL HIGH (ref 70–99)
POC Glucose: 119 mg/dl — ABNORMAL HIGH (ref 70–99)
POC Glucose: 90 mg/dl (ref 70–99)
POC Glucose: 127 mg/dl — ABNORMAL HIGH (ref 70–99)
POC Glucose: 92 mg/dl (ref 70–99)

## 2013-07-31 LAB — BASIC METABOLIC PANEL
BUN: 15 mg/dL (ref 7–20)
CO2: 27 mmol/L (ref 21–32)
Calcium: 8.8 mg/dL (ref 8.3–10.6)
Chloride: 103 mmol/L (ref 99–110)
Creatinine: 0.6 mg/dL (ref 0.6–1.1)
GFR African American: >60 (ref >60)
GFR Non-African American: >60 (ref >60)
Glucose: 119 mg/dL — ABNORMAL HIGH (ref 70–99)
Potassium: 3.8 mmol/L (ref 3.5–5.1)
Sodium: 137 mmol/L (ref 136–145)

## 2013-07-31 MED ADMIN — pantoprazole (PROTONIX) tablet 40 mg: ORAL | @ 22:00:00 | NDC 00008060701

## 2013-07-31 MED ADMIN — aluminum & magnesium hydroxide-simethicone (MAALOX) 200-200-20 MG/5ML suspension 30 mL: ORAL | @ 06:00:00 | NDC 00121176130

## 2013-07-31 MED ADMIN — PN-Adult Premix 5/20 - Standard Electrolytes - Central Line: INTRAVENOUS | @ 23:00:00 | NDC 63323014397

## 2013-07-31 MED ADMIN — citalopram (CELEXA) tablet 40 mg: ORAL | @ 14:00:00 | NDC 68084074411

## 2013-07-31 MED ADMIN — pantoprazole (PROTONIX) tablet 40 mg: ORAL | @ 10:00:00 | NDC 00008060701

## 2013-07-31 MED ADMIN — sucralfate (CARAFATE) tablet 1 g: ORAL | @ 19:00:00 | NDC 68084059301

## 2013-07-31 MED ADMIN — ampicillin-sulbactam (UNASYN) 1.5 g in sodium chloride 0.9 % 50 mL IVPB: INTRAVENOUS | @ 06:00:00 | NDC 00264180031

## 2013-07-31 MED ADMIN — enoxaparin (LOVENOX) injection 40 mg: SUBCUTANEOUS | @ 14:00:00 | NDC 00075801401

## 2013-07-31 MED ADMIN — sucralfate (CARAFATE) tablet 1 g: ORAL | @ 01:00:00 | NDC 68084059301

## 2013-07-31 MED ADMIN — promethazine (PHENERGAN) tablet 12.5 mg: ORAL | @ 06:00:00 | NDC 68084015511

## 2013-07-31 MED ADMIN — sucralfate (CARAFATE) tablet 1 g: ORAL | @ 10:00:00 | NDC 68084059301

## 2013-07-31 MED ADMIN — scopolamine (TRANSDERM-SCOP) 1.5 MG 1 patch: TRANSDERMAL | @ 01:00:00 | NDC 10019055388

## 2013-07-31 MED ADMIN — ampicillin-sulbactam (UNASYN) 1.5 g in sodium chloride 0.9 % 50 mL IVPB: INTRAVENOUS | @ 22:00:00 | NDC 00264180031

## 2013-07-31 MED ADMIN — fat emulsion 20 % infusion 250 mL: INTRAVENOUS | @ 23:00:00 | NDC 00338051902

## 2013-07-31 MED ADMIN — cloNIDine (CATAPRES) 0.1 MG/24HR 1 patch: TRANSDERMAL | @ 15:00:00 | NDC 00597003134

## 2013-07-31 MED ADMIN — aluminum & magnesium hydroxide-simethicone (MAALOX) 200-200-20 MG/5ML suspension 30 mL: ORAL | @ 19:00:00 | NDC 00121176130

## 2013-07-31 MED ADMIN — insulin lispro (HUMALOG) injection pen 0-12 Units: SUBCUTANEOUS | @ 02:00:00 | NDC 00002879901

## 2013-07-31 MED ADMIN — sucralfate (CARAFATE) tablet 1 g: ORAL | @ 14:00:00 | NDC 68084059301

## 2013-07-31 MED ADMIN — ampicillin-sulbactam (UNASYN) 1.5 g in sodium chloride 0.9 % 50 mL IVPB: INTRAVENOUS | @ 14:00:00 | NDC 00264180031

## 2013-07-31 MED ADMIN — oxyCODONE-acetaminophen (ROXICET) 5-325 MG/5ML solution 10 mL: 10 mL | ORAL | @ 16:00:00 | NDC 00054864816

## 2013-07-31 MED ADMIN — HYDROmorphone HCl PF (DILAUDID) injection SOLN 1 mg: 1 mg | INTRAVENOUS | @ 01:00:00 | NDC 00409128331

## 2013-07-31 MED ADMIN — HYDROmorphone HCl PF (DILAUDID) injection SOLN 1 mg: 1 mg | INTRAVENOUS | @ 06:00:00 | NDC 00409128331

## 2013-07-31 MED ADMIN — HYDROmorphone HCl PF (DILAUDID) injection SOLN 1 mg: 1 mg | INTRAVENOUS | @ 10:00:00 | NDC 00409128331

## 2013-07-31 MED FILL — UNASYN 1.5 (1-0.5) G IJ SOLR: 1.5 (1-0.5) g | INTRAMUSCULAR | Qty: 1.5

## 2013-07-31 MED FILL — CLINIMIX E/DEXTROSE (5/20) 5 % IV SOLN: 5 % | INTRAVENOUS | Qty: 2000

## 2013-07-31 MED FILL — SUCRALFATE 1 G PO TABS: 1 GM | ORAL | Qty: 1

## 2013-07-31 MED FILL — NORMAL SALINE FLUSH 0.9 % IV SOLN: 0.9 % | INTRAVENOUS | Qty: 10

## 2013-07-31 MED FILL — CLONIDINE HCL 0.1 MG/24HR TD PTWK: 0.1 MG/24HR | TRANSDERMAL | Qty: 1

## 2013-07-31 MED FILL — HYDROMORPHONE HCL PF 1 MG/ML IJ SOLN: 1 MG/ML | INTRAMUSCULAR | Qty: 1

## 2013-07-31 MED FILL — LOVENOX 40 MG/0.4ML SC SOLN: 40 MG/0.4ML | SUBCUTANEOUS | Qty: 0.4

## 2013-07-31 MED FILL — PROMETHAZINE HCL 25 MG PO TABS: 25 MG | ORAL | Qty: 1

## 2013-07-31 MED FILL — PANTOPRAZOLE SODIUM 40 MG PO TBEC: 40 MG | ORAL | Qty: 1

## 2013-07-31 MED FILL — INTRALIPID 20 % IV EMUL: 20 % | INTRAVENOUS | Qty: 250

## 2013-07-31 MED FILL — CITALOPRAM HYDROBROMIDE 20 MG PO TABS: 20 MG | ORAL | Qty: 2

## 2013-07-31 MED FILL — ROXICET 5-325 MG/5ML PO SOLN: 5-325 MG/5ML | ORAL | Qty: 10

## 2013-07-31 MED FILL — MAG-AL PLUS 200-200-20 MG/5ML PO LIQD: 200-200-20 MG/5ML | ORAL | Qty: 30

## 2013-07-31 NOTE — Discharge Instructions (Addendum)
[facility] [phone] in place for home infusion.   [facility] for dressing changes [phone]

## 2013-07-31 NOTE — Progress Notes (Signed)
[facility]  Continue to follow for home care.  [facility] [phone] in place for home infusion.

## 2013-07-31 NOTE — Progress Notes (Signed)
S:  Continues to have severe epigastric burning, relieved with dilaudid and maalox; ate yesterday without emesis; stools are loose    O:  BP 120/72   Pulse 71   Temp(Src) 98.3 ??F (36.8 ??C) (Oral)   Resp 16   Ht 5\' 6"  (1.676 m)   Wt 233 lb 1.6 oz (105.733 kg)   BMI 37.64 kg/m2   SpO2 98%  gen-alert, no distress  cvs-rrr, no murmur  resp-lungs cta bilaterally  Ext-no edema  abd-+bs, soft, drain in place with serous drainage    Lab Results   Component Value Date    CREATININE 0.6 07/31/2013    BUN 15 07/31/2013    NA 137 07/31/2013    K 3.8 07/31/2013    CL 103 07/31/2013    CO2 27 07/31/2013     FBS:  104, 76, 173, 135, 119, 90    A/P:  57 y.o. female  1.  Abdominal pain-s/p complicated lap band removal; required dilaudid overnight; continue maalox; TPN per surgery; home when pain controlled   2.  HTN-controlled

## 2013-07-31 NOTE — Progress Notes (Signed)
Patient medicated for epigastric pain. Labs were drawn and PICC was flushed.

## 2013-07-31 NOTE — Plan of Care (Signed)
Problem: Falls - Risk of  Goal: Absence of falls  Outcome: Completed Date Met:  07/31/13  Patient has steady gait. Room is free from clutter. Call light in reach. Patient is a low fall risk    Problem: Nausea/Vomiting  Goal: Adequate nutritional intake  Outcome: Ongoing  Patient has cyclic TPN for nutritional support.

## 2013-07-31 NOTE — Progress Notes (Signed)
States burning in esophagus from ches down to abdomen.   Refuses pain medication at present.

## 2013-07-31 NOTE — Consults (Signed)
NUTRITION THERAPY ASSESSMENT     Admission date:07/22/2013      Timepoint:f/u assessment     Rachael Mays is a 57 y.o. female admitted d/t had esophageal stent placed yesterday presents with uncontrolled pain and nausea and vomiting, unable to swallow; She was recently admitted 7/30 for gastric band erosion requiring removal. She was started on PN during that admission. Has been on nightly TPN at home.     RD consult for PN. Currently NPO on PN @ 40 ml/hr.     8/25:  Po intake is improved per pt.  Reports had bites of egg & hash browns, a piece of toast, & choc milk for bkf.  Cyclic PN will con't until pt able to consume adequate nutrition with po alone.     8/20:  Pt is tolerating small amounts of clear liquids.  Cyclic PN tolerated well @ goal.     OBJECTIVE DATA  Patient Active Problem List    Diagnosis Date Noted   ??? HTN (hypertension) 07/24/2013   ??? Hyperglycemia 07/24/2013   ??? Wound infection (HCC) 07/22/2013   ??? Complication of gastric banding 07/05/2013   ??? 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 Vertigo 04/11/2013   ??? Dizziness 03/29/2013   ??? 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 Morbid obesity 02/21/2009   ??? Back Pain 01/23/2009   ??? Deep Vein Thrombosis 01/23/2009   ??? Depression 01/23/2009   ??? High Cholesterol 01/23/2009   ??? Hypertension 01/23/2009   ??? Pulmonary Embolism 01/23/2009   ??? Nausea & vomiting 01/23/2009   ??? Abdominal  pain, other specified site 01/23/2009     Past Medical History   Diagnosis Date   ??? Anxiety    ??? Asthma    ??? Chronic back pain    ??? Deep vein thrombosis    ??? Depression    ??? GERD (gastroesophageal reflux disease)      NO LONGER SINCE LAP   ??? Pulmonary embolism (HCC)    ??? Obesity      hx of; had lap band   ??? Hyperlipidemia      hx; resolved with lap band   ??? GERD (gastroesophageal reflux disease) 01/23/2009   ??? Hypertension        Labs  Recent Labs      07/29/13   0610  07/30/13   0520  07/31/13   0621   NA  135*  137  137   K  4.0  3.7  3.8   CL   103  103  103   CO2  BUN  CREATININE  0.6  0.6  0.6   GLUCOSE  112*  134*  119*     Lab Results   Component Value Date    PHOS 3.5 07/27/2013       No results found for this basename: AST, ALT, ALB, BILIDIR, BILITOT, ALKPHOS,  in the last 72 hours  Lab Results   Component Value Date    LABALBU 3.8 07/22/2013      Lab Results   Component Value Date    TRIG 102 07/24/2013    HDL 60 06/30/2013    HDL 52 03/31/2011    LDLCALC 138 06/30/2013    LABVLDL 19 06/30/2013     No results found for this basename: LIPASE,  in the last 72 hours  Lab Results  Component Value Date    LABA1C 5.2 03/26/2010       Medications  Continuous Medications:      ??? PN-Adult Premix 5/20 - Standard Electrolytes - Central Line     ??? dextrose 5% and 0.45% NaCl with KCl 20 mEq 100 mL/hr at 07/30/13 1150   ??? dextrose     ??? fat emulsion 250 mL (07/27/13 1848)     Scheduled Medications:   ??? pantoprazole  40 mg Oral BID AC   ??? sucralfate  1 g Oral Q6H   ??? scopolamine  1 patch Transdermal Q72H   ??? enoxaparin  40 mg Subcutaneous Daily   ??? cloNIDine  1 patch Transdermal Weekly   ??? insulin lispro  0-12 Units Subcutaneous Q4H [facility]   ??? citalopram  40 mg Oral Daily   ??? sodium chloride flush  10 mL Intravenous Q12H SCH   ??? ampicillin-sulbactam  1.5 g Intravenous Q8H       Anthropometric Measures   Height: 5\' 6"  (167.6 cm)   Current Weight: Weight: 233 lb 1.6 oz (105.733 kg)   Admission weight: 220 lb (99.791 kg)  Weight Method: Actual  If applicable: n/a  Usual Body Weight:  lb       Ideal Body Weight: 130 lb      % Ideal Body Weight: 169 %  Adjusted Body Weight: 69 lb    Body mass index is 37.64 kg/(m^2).   Classified as:  Less than 18.5 Underweight  18.5-24.9 Normal Weight  25-29.9 Overweight  30-34.9 Obese Class I  35-39.9 Obese Class II  Greater than or equal to 40 Obese Class III    Patient Vitals for the past 96 hrs (Last 3 readings):   Weight   07/30/13 0500 233 lb 1.6 oz (105.733 kg)   07/29/13 0525 232 lb 6.4 oz (105.416 kg)    07/28/13 0714 246 lb 4.1 oz (111.7 kg)     Wt Readings from Last 50 Encounters:   07/30/13 233 lb 1.6 oz (105.733 kg)   07/20/13 205 lb (92.987 kg)   07/09/13 236 lb (107.049 kg)   06/29/13 231 lb (104.781 kg)   06/12/13 225 lb (102.059 kg)   06/07/13 228 lb 8 oz (103.647 kg)   05/30/13 229 lb (103.874 kg)   05/22/13 230 lb 8 oz (104.554 kg)   05/19/13 231 lb 11.3 oz (105.1 kg)   05/18/13 225 lb (102.059 kg)   05/18/13 232 lb (105.235 kg)   04/27/13 229 lb 3.2 oz (103.964 kg)   04/17/13 230 lb (104.327 kg)   04/03/13 231 lb (104.781 kg)   03/29/13 228 lb 8 oz (103.647 kg)   03/10/13 227 lb 8 oz (103.193 kg)   02/23/13 227 lb 9.6 oz (103.239 kg)   12/28/12 217 lb (98.431 kg)   12/15/12 216 lb (97.977 kg)   10/20/12 213 lb (96.616 kg)   06/24/12 212 lb (96.163 kg)   06/02/12 210 lb (95.255 kg)   05/31/12 208 lb 3.2 oz (94.439 kg)   05/19/12 209 lb (94.802 kg)   04/14/12 211 lb (95.709 kg)   03/17/12 213 lb (96.616 kg)   03/16/12 214 lb (97.07 kg)   12/23/11 223 lb (101.152 kg)   11/14/11 220 lb (99.791 kg)   11/07/11 220 lb (99.791 kg)   11/03/11 226 lb (102.513 kg)   05/15/11 233 lb 12.8 oz (106.051 kg)   04/08/10 240 lb (108.863 kg)   04/01/10 240 lb (108.863 kg)   03/26/10  241 lb 2 oz (109.374 kg)   02/06/10 248 lb 3 oz (112.577 kg)   11/07/09 243 lb (110.224 kg)   10/23/09 235 lb (106.595 kg)   08/07/09 246 lb (111.585 kg)   05/02/09 242 lb 5 oz (109.912 kg)   02/21/09 245 lb (111.131 kg)   01/23/09 244 lb 4 oz (110.791 kg)   11/19/08 243 lb 9 oz (110.479 kg)       Comparative Standards   Weight Used: adjusted bodyweight 69 kg   Estimated Calorie Needs: 1725-2070 kcal (based on 25- 30 kcal/kg)  Estimated Protein Needs: 83-104g (based on 1.2- 1.5gm/kg)  Estimated Fluid Needs: 1725-2070 ml (based on 1 mL/kcal)    Nutrition-focused Physical Findings           Emesis: Emesis Appearance: Green (minimal amount)   Stool:      none reported  Skin/Edema:  Skin Integrity (WDL): Within Defined Limits   Location: abd Skin  Integrity: Other (Comment) (dressing left side abd)     Chewing / Swallowing:  Can't swallow s/p esoph. Stent placed 8/14.   Mental Status / Barriers:  no issues reported    Food/Nutrition-Related History  Pre-Admission / Home Diet:  Pre-Admission/Home Diet: NPO   Home Supplements / Herbals:    none noted  Food Restrictions / Cultural Requests:    none noted  Allergies as of 07/22/2013 - Review Complete 07/22/2013   Allergen Reaction Noted   ??? Nystatin Other (See Comments) 03/29/2013   ??? Talwin [pentazocine] Other (See Comments) 03/29/2013       PO Diet Orders  Current diet order: DIET GI SOFT;  PN-Adult Premix 5/20 - Standard Electrolytes - Central Line   Current supplement order:  none  Feeding Assistance:        Feeding: Able to feed self    Room Service: selective  Nursing Recorded PO Intake: PO Meals Eaten (%): 26 - 50%   Patient / Family Reported PO Intake: n/a  Observed PO Intake: n/a  Pain does not affect PO intake.    Tube Feeding Order / Parenteral Order  Cyclic PN:  50 ml taper plus 190 ml/hr x 10 hr      Intake/Output Summary (Last 24 hours) at 07/31/13 1343  Last data filed at 07/31/13 1213   Gross per 24 hour   Intake   3625 ml   Output      0 ml   Net   3625 ml       Intake vs. Needs: adequate with PN @ goal    NUTRITION DIAGNOSIS and GOAL  Nutrition Deficit Risk.   ?? Problem: altered GI function  ?? Etiology/related to: NPO  ?? Symptoms/Signs/as evidenced by: on HPN; s/p esoph stent and diff swallowing; had lap band removal 2weeks ago.      Goal: patient will tolerate PN @ goal rate or diet adv to meet nutrition needs.   ?? Initiated: 08/16       ?? Progress: 8/20: PN cyclic @ goal  ?? Resolved: 8/25:  Starting to eat better; PN remains @ goal.    EDUCATION  8/25:  Reviewed basic GI soft guidelines.    MALNUTRITION  Per AND/ASPEN guidelines, patient does not meet criteria for malnutrition.    INTERVENTION HISTORY  08/16 initial assessment  8/20 f/u assessment  8/25 f/u assessment      NUTRITION  RECOMMENDATIONS / PRESCRIPTION / MONITORING / EVALUATION  1.  PN @ goal:  cyclic PM, recommend 50 ml/hr taper  up/down first and last hour; 190 ml/hr x 10 hours.   2. Monitor for advancement of PN to goal rate and patient tolerance.   3. Monitor tolerance to diet as advanced beyond clears.    Pt is at moderate nutritional risk. Will follow up in 4-7 days to monitor PN advanced to goal rate to meet 100% of needs.     Icelyn Navarrete R Kyndel Egger RD, LD  Pager: [phone]

## 2013-07-31 NOTE — Progress Notes (Signed)
Packing removed from wound with  Small amount of bloody drainage noted.  Wound repacked with iodoform gauze .

## 2013-07-31 NOTE — Progress Notes (Signed)
Shift assessment completed, see flow sheet. No s/s of distress, no needs at this time. Call light in reach, fall precaution in place.

## 2013-07-31 NOTE — Progress Notes (Signed)
Patient is having epigastric discomfort. I gave her dilaudid earlier in the night for it, but it has returned. I spoke with the on call doctor, new orders received. I gave patient maalox and dilaudid.

## 2013-07-31 NOTE — Progress Notes (Signed)
Maalox given for c/o  Burning from "ribs to ribs and chest to torso"

## 2013-08-01 LAB — CBC WITH AUTO DIFFERENTIAL
Basophils %: 0.8 %
Basophils Absolute: 0.1 10*3/uL (ref 0.0–0.2)
Eosinophils %: 5.6 %
Eosinophils Absolute: 0.5 10*3/uL (ref 0.0–0.6)
Hematocrit: 35.7 % — ABNORMAL LOW (ref 36.0–48.0)
Hemoglobin: 11.6 g/dL — ABNORMAL LOW (ref 12.0–16.0)
Lymphocytes %: 32.4 %
Lymphocytes Absolute: 3.1 10*3/uL (ref 1.0–5.1)
MCH: 27.8 pg (ref 26.0–34.0)
MCHC: 32.6 g/dL (ref 31.0–36.0)
MCV: 85.3 fL (ref 80.0–100.0)
MPV: 8.5 fL (ref 5.0–10.5)
Monocytes %: 12.4 %
Monocytes Absolute: 1.2 10*3/uL (ref 0.0–1.3)
Neutrophils %: 48.8 %
Neutrophils Absolute: 4.6 10*3/uL (ref 1.7–7.7)
Platelets: 161 10*3/uL (ref 135–450)
RBC: 4.18 M/uL (ref 4.00–5.20)
RDW: 14 % (ref 12.4–15.4)
WBC: 9.5 10*3/uL (ref 4.0–11.0)

## 2013-08-01 LAB — POCT GLUCOSE
POC Glucose: 163 mg/dl — ABNORMAL HIGH (ref 70–99)
POC Glucose: 155 mg/dl — ABNORMAL HIGH (ref 70–99)
POC Glucose: 111 mg/dl — ABNORMAL HIGH (ref 70–99)
POC Glucose: 102 mg/dl — ABNORMAL HIGH (ref 70–99)
POC Glucose: 102 mg/dl — ABNORMAL HIGH (ref 70–99)
POC Glucose: 78 mg/dl (ref 70–99)

## 2013-08-01 LAB — BASIC METABOLIC PANEL
BUN: 15 mg/dL (ref 7–20)
CO2: 31 mmol/L (ref 21–32)
Calcium: 9.3 mg/dL (ref 8.3–10.6)
Chloride: 99 mmol/L (ref 99–110)
Creatinine: 0.7 mg/dL (ref 0.6–1.1)
GFR African American: >60 (ref >60)
GFR Non-African American: >60 (ref >60)
Glucose: 80 mg/dL (ref 70–99)
Potassium: 4.8 mmol/L (ref 3.5–5.1)
Sodium: 134 mmol/L — ABNORMAL LOW (ref 136–145)

## 2013-08-01 LAB — AMYLASE: Amylase: 45 U/L (ref 25–115)

## 2013-08-01 LAB — LIPASE: Lipase: 57 U/L (ref 13.0–60.0)

## 2013-08-01 MED ADMIN — ALPRAZolam (XANAX) tablet 0.5 mg: ORAL | @ 12:00:00 | NDC 68084067211

## 2013-08-01 MED ADMIN — sucralfate (CARAFATE) tablet 1 g: ORAL | @ 22:00:00 | NDC 51079075301

## 2013-08-01 MED ADMIN — ampicillin-sulbactam (UNASYN) 1.5 g in sodium chloride 0.9 % 50 mL IVPB: INTRAVENOUS | @ 12:00:00 | NDC 00264180031

## 2013-08-01 MED ADMIN — sodium chloride flush 0.9 % injection 10 mL: INTRAVENOUS | @ 03:00:00

## 2013-08-01 MED ADMIN — pantoprazole (PROTONIX) 80 mg in sodium chloride 0.9 % 100 mL infusion: INTRAVENOUS | @ 03:00:00 | NDC 00338004938

## 2013-08-01 MED ADMIN — sodium chloride flush 0.9 % injection 10 mL: INTRAVENOUS | @ 12:00:00

## 2013-08-01 MED ADMIN — insulin lispro (HUMALOG) injection pen 0-12 Units: SUBCUTANEOUS | @ 02:00:00 | NDC 00002879901

## 2013-08-01 MED ADMIN — aluminum & magnesium hydroxide-simethicone (MAALOX) 200-200-20 MG/5ML suspension 30 mL: ORAL | @ 02:00:00 | NDC 00121176130

## 2013-08-01 MED ADMIN — ampicillin-sulbactam (UNASYN) 1.5 g in sodium chloride 0.9 % 50 mL IVPB: INTRAVENOUS | @ 06:00:00 | NDC 00264180031

## 2013-08-01 MED ADMIN — insulin lispro (HUMALOG) injection pen 0-12 Units: SUBCUTANEOUS | @ 06:00:00 | NDC 00002879901

## 2013-08-01 MED ADMIN — iohexol (OMNIPAQUE 240) injection 50 mL: ORAL | @ 17:00:00 | NDC 00407141230

## 2013-08-01 MED ADMIN — sucralfate (CARAFATE) tablet 1 g: ORAL | @ 02:00:00 | NDC 51079087101

## 2013-08-01 MED ADMIN — pantoprazole (PROTONIX) 80 mg in sodium chloride 0.9 % 100 mL infusion: INTRAVENOUS | @ 22:00:00 | NDC 00338004938

## 2013-08-01 MED ADMIN — citalopram (CELEXA) tablet 40 mg: ORAL | @ 12:00:00 | NDC 68084074411

## 2013-08-01 MED ADMIN — aluminum & magnesium hydroxide-simethicone (MAALOX) 200-200-20 MG/5ML suspension 30 mL: ORAL | @ 09:00:00 | NDC 00121176130

## 2013-08-01 MED ADMIN — sucralfate (CARAFATE) tablet 1 g: ORAL | @ 09:00:00 | NDC 68084059301

## 2013-08-01 MED ADMIN — ampicillin-sulbactam (UNASYN) 1.5 g in sodium chloride 0.9 % 50 mL IVPB: INTRAVENOUS | @ 22:00:00 | NDC 00264180031

## 2013-08-01 MED ADMIN — enoxaparin (LOVENOX) injection 40 mg: SUBCUTANEOUS | @ 12:00:00 | NDC 00075801401

## 2013-08-01 MED ADMIN — pantoprazole (PROTONIX) 80 mg in sodium chloride 0.9 % 100 mL infusion: INTRAVENOUS | @ 12:00:00 | NDC 00338004938

## 2013-08-01 MED ADMIN — ioversol (OPTIRAY) 68 % injection 100 mL: INTRAVENOUS | @ 19:00:00 | NDC 00019132383

## 2013-08-01 MED ADMIN — sucralfate (CARAFATE) tablet 1 g: ORAL | @ 12:00:00 | NDC 51079087101

## 2013-08-01 MED ADMIN — promethazine (PHENERGAN) tablet 12.5 mg: ORAL | @ 12:00:00 | NDC 68084015511

## 2013-08-01 MED ADMIN — promethazine (PHENERGAN) tablet 12.5 mg: ORAL | @ 17:00:00 | NDC 68084015511

## 2013-08-01 MED ADMIN — oxyCODONE-acetaminophen (ROXICET) 5-325 MG/5ML solution 10 mL: 10 mL | ORAL | @ 12:00:00 | NDC 00054864816

## 2013-08-01 MED ADMIN — oxyCODONE-acetaminophen (ROXICET) 5-325 MG/5ML solution 10 mL: 10 mL | ORAL | @ 17:00:00 | NDC 00054864816

## 2013-08-01 MED ADMIN — HYDROmorphone HCl PF (DILAUDID) injection SOLN 1 mg: 1 mg | INTRAVENOUS | @ 06:00:00 | NDC 00409128331

## 2013-08-01 MED ADMIN — HYDROmorphone HCl PF (DILAUDID) injection SOLN 1 mg: 1 mg | INTRAVENOUS | @ 02:00:00 | NDC 00409128331

## 2013-08-01 MED FILL — PROTONIX 40 MG IV SOLR: 40 MG | INTRAVENOUS | Qty: 80

## 2013-08-01 MED FILL — SUCRALFATE 1 G PO TABS: 1 GM | ORAL | Qty: 1

## 2013-08-01 MED FILL — PROMETHAZINE HCL 25 MG PO TABS: 25 MG | ORAL | Qty: 1

## 2013-08-01 MED FILL — MAG-AL PLUS 200-200-20 MG/5ML PO LIQD: 200-200-20 MG/5ML | ORAL | Qty: 30

## 2013-08-01 MED FILL — ROXICET 5-325 MG/5ML PO SOLN: 5-325 MG/5ML | ORAL | Qty: 10

## 2013-08-01 MED FILL — CLINIMIX E/DEXTROSE (5/20) 5 % IV SOLN: 5 % | INTRAVENOUS | Qty: 2000

## 2013-08-01 MED FILL — CITALOPRAM HYDROBROMIDE 20 MG PO TABS: 20 MG | ORAL | Qty: 2

## 2013-08-01 MED FILL — ALPRAZOLAM 0.5 MG PO TABS: 0.5 MG | ORAL | Qty: 1

## 2013-08-01 MED FILL — NORMAL SALINE FLUSH 0.9 % IV SOLN: 0.9 % | INTRAVENOUS | Qty: 10

## 2013-08-01 MED FILL — UNASYN 1.5 (1-0.5) G IJ SOLR: 1.5 (1-0.5) g | INTRAMUSCULAR | Qty: 1.5

## 2013-08-01 MED FILL — LOVENOX 40 MG/0.4ML SC SOLN: 40 MG/0.4ML | SUBCUTANEOUS | Qty: 0.4

## 2013-08-01 MED FILL — HYDROMORPHONE HCL PF 1 MG/ML IJ SOLN: 1 MG/ML | INTRAMUSCULAR | Qty: 1

## 2013-08-01 MED FILL — OMNIPAQUE 240 MG/ML IJ SOLN: 240 MG/ML | INTRAMUSCULAR | Qty: 50

## 2013-08-01 MED FILL — NORMAL SALINE FLUSH 0.9 % IV SOLN: 0.9 % | INTRAVENOUS | Qty: 20

## 2013-08-01 NOTE — Progress Notes (Addendum)
[facility]  Gastroenterology Progress Note    Rachael Mays is a 57 y.o. female patient.  Principal Problem:    Nausea & vomiting  Active Problems:    Abdominal  pain, other specified site    Morbid obesity    Meniere syndrome    Complication of gastric banding    Wound infection (HCC)    HTN (hypertension)    Hyperglycemia      SUBJECTIVE:  She reports stable pain from the chest to the umbilicus. Nausea persists and is stable. No vomiting in the past few days. She ate half of a quesadilla and some peas over several hours for dinner last night.     ROS:  Denies cough, SOB.    Gastrointestinal ROS: positive for - abdominal pain, appetite loss and nausea  negative for - diarrhea, hematemesis or melena    Physical    VITALS:  BP 152/78   Pulse 78   Temp(Src) 99.2 ??F (37.3 ??C) (Oral)   Resp 18   Ht 5\' 6"  (1.676 m)   Wt 240 lb 4.8 oz (109 kg)   BMI 38.8 kg/m2   SpO2 96%  TEMPERATURE:  Current - Temp: 99.2 ??F (37.3 ??C); Max - Temp  Avg: 98.8 ??F (37.1 ??C)  Min: 98.2 ??F (36.8 ??C)  Max: 99.2 ??F (37.3 ??C)    NAD  RRR, Nl s1s2  Lungs CTA Bilaterally, normal effort  Abdomen soft, ND, mild epigastric tenderness,  No guarding or rebound, no HSM, Bowel sounds normal.    Data    Data Review:    Recent Labs      08/01/13   0454   WBC  9.5   HGB  11.6*   HCT  35.7*   MCV  85.3   PLT  161     Recent Labs      07/30/13   0520  07/31/13   0621  08/01/13   0513   NA  137  137  134*   K  3.7  3.8  4.8   CL  103  103  99   CO2  27  27  31    BUN  14  15  15    CREATININE  0.6  0.6  0.7     No results found for this basename: AST, ALT, ALB, BILIDIR, BILITOT, ALKPHOS,  in the last 72 hours  No results found for this basename: LIPASE, AMYLASE,  in the last 72 hours  No results found for this basename: PROTIME, INR,  in the last 72 hours  No results found for this basename: PTT,  in the last 72 hours          ASSESSMENT :  Chest pain, Nausea and vomiting s/p Esophageal Stent placement - suspect result of stent in place. No apparent complication of  stent on prior CT or UGI so would leave in for 2 - 4 weeks in hopes of closing gastric fistula. Pain is stable but she is still requiring pain medications. She is tolerating po.      PLAN :   1) NPO     2) Continue PRN roxicet liquid, Zofran SL/Phenergan tabs for symptom relief.     3) Protonix and Pepcid      4) TPN per Dr. Suzi Roots.     5) check CT chest and abdomen W IV and PO (water soluble) contrast to eval for leak and eval for position of stent.     Soyla Dryer, PA-C  [facility]    I have personally performed a face to face diagnostic evaluation on this patient.  I have interviewed and examined the patient and I agree with the findings and recommended plan of care.  In summary, my findings and plan are the following: appears comfortable, abd soft, with continued sx will recheck imaging.      Karrie Doffing, MD  [facility]

## 2013-08-01 NOTE — Progress Notes (Signed)
[facility]  Continue to follow for home care.  [facility] [phone] in place for home infusion.

## 2013-08-01 NOTE — Progress Notes (Signed)
Patient PICC line fell out. IV started in left forearm.

## 2013-08-01 NOTE — Progress Notes (Signed)
Patient updated on plan of care for CT today, oral contrast started, patient given pain and nausea medication prior to starting PO contrast.  Patient sitting up in bed, no other needs stated at this time.    Rachael Mays E Arden Tinoco

## 2013-08-01 NOTE — Plan of Care (Signed)
Problem: Pain  Goal: Reduction in pain sensation  Outcome: Ongoing  Goal: Control of acute pain  Outcome: Ongoing  Goal: Control of chronic pain  Outcome: Ongoing    Problem: Nausea/Vomiting  Goal: Absence of nausea/vomiting  Outcome: Ongoing  Goal: Able to drink  Outcome: Ongoing  Goal: Able to eat  Outcome: Ongoing  Goal: Adequate nutritional intake  Outcome: Ongoing

## 2013-08-01 NOTE — Progress Notes (Signed)
Morning assessment completed, patient given pain and anxiety medication per patient request, plan of care reviewed, patient states the burning sensation in her throat seems to have gotten better with the IV protonix.  Plan of care to only use oral pain and nausea medication to help prep patient for home soon, patient verbally understands.  Encouraged patient to be up ambulating more frequently and using IS more.  Patient verbally understands.  Patient concerned to go home due to how much pain and nausea she is still experiencing.    Jathniel Smeltzer E Zailyn Rowser

## 2013-08-01 NOTE — Progress Notes (Signed)
Dr. Suzi Roots in to see patient to help ease patient anxiety, Dressing and JP site dressing changed while doctor at bedside.  Patient still okay to go home from surgery stand point, Dr. Cherene Altes for GI in to see patient, will do further work up today to rule out any other complications.  Patient with questions regarding when she will go home. Patient requests to have JP removed, explained importance of leaving drain in until after stent removed with follow up procedure.     Juliene Kirsh E Belvin Gauss

## 2013-08-01 NOTE — Progress Notes (Signed)
Patient medicated for pain.

## 2013-08-01 NOTE — Progress Notes (Signed)
S:  Continues to have severe epigastric burning, relieved some with dilaudid and maalox; little po yesterday; PICC fell out overnight    O:  BP 152/78   Pulse 78   Temp(Src) 99.2 ??F (37.3 ??C) (Oral)   Resp 18   Ht 5\' 6"  (1.676 m)   Wt 240 lb 4.8 oz (109 kg)   BMI 38.8 kg/m2   SpO2 96%  gen-alert, no distress  cvs-rrr, no murmur  resp-lungs cta bilaterally  Ext-no edema  abd-+bs, soft, drain in place with serous drainage    Lab Results   Component Value Date    CREATININE 0.7 08/01/2013    BUN 15 08/01/2013    NA 134* 08/01/2013    K 4.8 08/01/2013    CL 99 08/01/2013    CO2 31 08/01/2013     FBS:  127, 92, 163, 155, 111, 102    A/P:  57 y.o. female  1.  Abdominal pain-s/p complicated lap band removal; continues to require dilaudid for pain control; continue maalox; PPI ggt; TPN per surgery; check amylase and lipase; appreciate GI reevaluation; replace PICC for TPN  2.  HTN-up this morning but overall controlled

## 2013-08-01 NOTE — Progress Notes (Signed)
Patient tolerated PO intake the past 2 days with no emesis.  Continues to have mild nausea.  Complains of diffuse abdominal pain from sternum to pubis.  States Rachael Mays pain is a 7, but appears very comfortable.    Filed Vitals:    08/01/13 0714   BP: 152/78   Pulse: 78   Temp: 99.2 ??F (37.3 ??C)   Resp: 18     Abd: soft, NT/ND.  Wound in LUQ at port site looks good. No erythema.    JP- minimal serous output      AP:  S/p band removal with contained, small leak  Stent in place. Most of Rachael Mays symptoms are likely related to having the stent in place  Liquids/ diet as tolerated  Cont TPN until oral intake adequate/ replacing PICC today  Wean off IV pain meds, and plan to d/c  Will follow

## 2013-08-01 NOTE — Plan of Care (Signed)
Problem: Nausea/Vomiting  Goal: Able to drink  Outcome: Ongoing  Patient is able to drink. There are complaints of epigastric pain.

## 2013-08-01 NOTE — Progress Notes (Signed)
Patient back to room from CT, updated PICC line nurse.  Rachael Mays E [name]

## 2013-08-01 NOTE — Progress Notes (Signed)
Called and updated Dr. Cherene Altes on patients request for a regular diet.  Instructed patient on plan of care for full liquid diet tonight and GI to reevaluate in the morning.  Patient verbally understands.  Patient given oral pain medication and nausea medication, still waiting on picc line to be replaced.  Rachael Mays

## 2013-08-01 NOTE — Progress Notes (Signed)
Patient to CT in wheelchair, to have PICC placed once returned to floor.  Rachael Mays E Rachael Mays

## 2013-08-02 LAB — BASIC METABOLIC PANEL
BUN: 19 mg/dL (ref 7–20)
CO2: 28 mmol/L (ref 21–32)
Calcium: 8.8 mg/dL (ref 8.3–10.6)
Chloride: 102 mmol/L (ref 99–110)
Creatinine: 0.7 mg/dL (ref 0.6–1.1)
GFR African American: >60 (ref >60)
GFR Non-African American: >60 (ref >60)
Glucose: 123 mg/dL — ABNORMAL HIGH (ref 70–99)
Potassium: 3.7 mmol/L (ref 3.5–5.1)
Sodium: 135 mmol/L — ABNORMAL LOW (ref 136–145)

## 2013-08-02 LAB — POCT GLUCOSE
POC Glucose: 80 mg/dl (ref 70–99)
POC Glucose: 194 mg/dl — ABNORMAL HIGH (ref 70–99)
POC Glucose: 126 mg/dl — ABNORMAL HIGH (ref 70–99)
POC Glucose: 94 mg/dl (ref 70–99)
POC Glucose: 86 mg/dl (ref 70–99)

## 2013-08-02 MED ADMIN — promethazine (PHENERGAN) tablet 12.5 mg: ORAL | @ 03:00:00 | NDC 68084015511

## 2013-08-02 MED ADMIN — sodium chloride flush 0.9 % injection 10 mL: INTRAVENOUS | @ 03:00:00

## 2013-08-02 MED ADMIN — enoxaparin (LOVENOX) injection 40 mg: SUBCUTANEOUS | @ 14:00:00 | NDC 00075801401

## 2013-08-02 MED ADMIN — ampicillin-sulbactam (UNASYN) 1.5 g in sodium chloride 0.9 % 50 mL IVPB: INTRAVENOUS | @ 07:00:00 | NDC 00264180031

## 2013-08-02 MED ADMIN — sodium chloride flush 0.9 % injection 10 mL: INTRAVENOUS | @ 14:00:00

## 2013-08-02 MED ADMIN — sucralfate (CARAFATE) tablet 1 g: ORAL | @ 19:00:00 | NDC 68084059301

## 2013-08-02 MED ADMIN — sucralfate (CARAFATE) tablet 1 g: ORAL | @ 07:00:00 | NDC 51079075301

## 2013-08-02 MED ADMIN — ampicillin-sulbactam (UNASYN) 1.5 g in sodium chloride 0.9 % 50 mL IVPB: INTRAVENOUS | @ 14:00:00 | NDC 00264180031

## 2013-08-02 MED ADMIN — pantoprazole (PROTONIX) 80 mg in sodium chloride 0.9 % 100 mL infusion: INTRAVENOUS | @ 07:00:00 | NDC 00338004938

## 2013-08-02 MED ADMIN — insulin lispro (HUMALOG) injection pen 0-12 Units: SUBCUTANEOUS | @ 08:00:00 | NDC 00002879901

## 2013-08-02 MED ADMIN — promethazine (PHENERGAN) tablet 12.5 mg: ORAL | @ 07:00:00 | NDC 68084015511

## 2013-08-02 MED ADMIN — PN-Adult Premix 5/20 - Standard Electrolytes - Central Line: INTRAVENOUS | @ 03:00:00 | NDC 00338112504

## 2013-08-02 MED ADMIN — sucralfate (CARAFATE) tablet 1 g: ORAL | @ 14:00:00 | NDC 51079075301

## 2013-08-02 MED ADMIN — sucralfate (CARAFATE) tablet 1 g: ORAL | @ 03:00:00 | NDC 51079075301

## 2013-08-02 MED ADMIN — citalopram (CELEXA) tablet 40 mg: ORAL | @ 14:00:00 | NDC 68084074411

## 2013-08-02 MED ADMIN — oxyCODONE-acetaminophen (ROXICET) 5-325 MG/5ML solution 10 mL: 10 mL | ORAL | @ 07:00:00 | NDC 00054864816

## 2013-08-02 MED ADMIN — oxyCODONE-acetaminophen (ROXICET) 5-325 MG/5ML solution 10 mL: 10 mL | ORAL | @ 17:00:00 | NDC 00054864816

## 2013-08-02 MED ADMIN — oxyCODONE-acetaminophen (ROXICET) 5-325 MG/5ML solution 10 mL: 10 mL | ORAL | @ 03:00:00 | NDC 00054864816

## 2013-08-02 MED ADMIN — HYDROmorphone HCl PF (DILAUDID) injection SOLN 1 mg: 1 mg | INTRAVENOUS | @ 14:00:00 | NDC 00409128331

## 2013-08-02 MED FILL — KCL IN DEXTROSE-NACL 20-5-0.45 MEQ/L-%-% IV SOLN: INTRAVENOUS | Qty: 1000

## 2013-08-02 MED FILL — ROXICET 5-325 MG/5ML PO SOLN: 5-325 MG/5ML | ORAL | Qty: 10

## 2013-08-02 MED FILL — UNASYN 1.5 (1-0.5) G IJ SOLR: 1.5 (1-0.5) g | INTRAMUSCULAR | Qty: 1.5

## 2013-08-02 MED FILL — CITALOPRAM HYDROBROMIDE 20 MG PO TABS: 20 MG | ORAL | Qty: 2

## 2013-08-02 MED FILL — SUCRALFATE 1 G PO TABS: 1 GM | ORAL | Qty: 1

## 2013-08-02 MED FILL — LOVENOX 40 MG/0.4ML SC SOLN: 40 MG/0.4ML | SUBCUTANEOUS | Qty: 0.4

## 2013-08-02 MED FILL — NORMAL SALINE FLUSH 0.9 % IV SOLN: 0.9 % | INTRAVENOUS | Qty: 10

## 2013-08-02 MED FILL — PROTONIX 40 MG IV SOLR: 40 MG | INTRAVENOUS | Qty: 80

## 2013-08-02 MED FILL — MYCOSTATIN 100000 UNIT/GM EX POWD: 100000 UNIT/GM | CUTANEOUS | Qty: 15

## 2013-08-02 MED FILL — PROMETHAZINE HCL 25 MG PO TABS: 25 MG | ORAL | Qty: 1

## 2013-08-02 MED FILL — HYDROMORPHONE HCL PF 1 MG/ML IJ SOLN: 1 MG/ML | INTRAMUSCULAR | Qty: 1

## 2013-08-02 MED FILL — TRANSDERM-SCOP (1.5 MG) 1 MG/3DAYS TD PT72: 1 MG/3DAYS | TRANSDERMAL | Qty: 1

## 2013-08-02 NOTE — Progress Notes (Addendum)
[facility]  Gastroenterology Progress Note    Rachael Mays is a 57 y.o. female patient.  Principal Problem:    Nausea & vomiting  Active Problems:    Abdominal  pain, other specified site    Morbid obesity    Meniere syndrome    Complication of gastric banding    Wound infection (HCC)    HTN (hypertension)    Hyperglycemia      SUBJECTIVE:  Eager to go home.  Nausea and epigastric and chest burning are stable. Tolerating full liquids without vomiting.     ROS:  Denies cough, SOB.    Gastrointestinal ROS: positive for - abdominal pain, appetite loss and nausea  negative for - diarrhea, hematemesis or melena    Physical    VITALS:  BP 138/65   Pulse 76   Temp(Src) 97.7 ??F (36.5 ??C) (Oral)   Resp 16   Ht 5\' 6"  (1.676 m)   Wt 240 lb 4.8 oz (109 kg)   BMI 38.8 kg/m2   SpO2 97%  TEMPERATURE:  Current - Temp: 97.7 ??F (36.5 ??C); Max - Temp  Avg: 98.7 ??F (37.1 ??C)  Min: 97.7 ??F (36.5 ??C)  Max: 99.3 ??F (37.4 ??C)    NAD  RRR, Nl s1s2  Lungs CTA Bilaterally, normal effort  Abdomen soft, ND, mild epigastric tenderness,  No guarding or rebound, no HSM, Bowel sounds normal.    Data    Data Review:    Recent Labs      08/01/13   0454   WBC  9.5   HGB  11.6*   HCT  35.7*   MCV  85.3   PLT  161     Recent Labs      07/31/13   0621  08/01/13   0513  08/02/13   0645   NA  137  134*  135*   K  3.8  4.8  3.7   CL  103  99  102   CO2  27  31  28    BUN  15  15  19    CREATININE  0.6  0.7  0.7     No results found for this basename: AST, ALT, ALB, BILIDIR, BILITOT, ALKPHOS,  in the last 72 hours  Recent Labs      08/01/13   0513   LIPASE  57.0   AMYLASE  45     No results found for this basename: PROTIME, INR,  in the last 72 hours  No results found for this basename: PTT,  in the last 72 hours    CT chest and abdomen W IV and PO contrast: 08/01/13  IMPRESSION:      No evidence of residual or recurrent leak or contrast   extravasation with a well-positioned esophagogastric stent and   well placed adjacent external stent.         ASSESSMENT :   Chest pain, Nausea and vomiting s/p Esophageal Stent placement - suspect result of stent in place. No apparent complication of stent on prior CT or UGI so would leave in for 2 - 4 weeks in hopes of closing gastric fistula. Pain is stable. She is tolerating po. CT 07/12/13 with stent in position and no leak.     PLAN :   1) Would stick to a full liquid diet     2) Continue PRN PO pain meds and antiemetics     3) Protonix and Pepcid      4)  TPN per Dr. Suzi Roots.     5) card given to pt to call and set up endoscopy with Dr. Daphine Deutscher for stent removal in the next 2-4 weeks.        Soyla Dryer, PA-C  [facility]    I have personally performed a face to face diagnostic evaluation on this patient.  I have interviewed and examined the patient and I agree with the findings and recommended plan of care.  In summary, my findings and plan are the following: tolerating full liquid, abd exam soft, mild/stable tender but overall benign exam, ct yesterday with stent good position/no leak, discussed full liquid diet and already has tpn to maintain nutrition, can advance to very soft diet over time as continues improve, then f/u stent reeval with dr Daphine Deutscher as scheduled. Dc plan noted, call if needed.      Karrie Doffing, MD  [facility]

## 2013-08-02 NOTE — Progress Notes (Signed)
Department of Family Practice  Adult Daily Progress Note      SUBJECTIVE  Tolerating diet with no emesis.    OBJECTIVE    Physical    BP 138/65   Pulse 76   Temp(Src) 97.7 ??F (36.5 ??C) (Oral)   Resp 16   Ht 5\' 6"  (1.676 m)   Wt 240 lb 4.8 oz (109 kg)   BMI 38.8 kg/m2   SpO2 97%         CONSTITUTIONAL:   alert and  not in acute distress  CHEST:  clear to auscultation bilaterally  CARDIOVASCULAR:  Cor RRR  ABDOMEN:  soft, non-tender, without masses or organomegaly and normal bowel sounds  EXTREMITIES:  none edema bilateral    Data    CBC:   Lab Results   Component Value Date    WBC 9.5 08/01/2013    RBC 4.18 08/01/2013    HGB 11.6 08/01/2013    HCT 35.7 08/01/2013    MCV 85.3 08/01/2013    MCH 27.8 08/01/2013    MCHC 32.6 08/01/2013    RDW 14.0 08/01/2013    PLT 161 08/01/2013    MPV 8.5 08/01/2013     BMP:    Lab Results   Component Value Date    NA 135 08/02/2013    K 3.7 08/02/2013    CL 102 08/02/2013    CO2 28 08/02/2013    BUN 19 08/02/2013    LABALBU 3.8 07/22/2013    CREATININE 0.7 08/02/2013    CREATININE 0.8 05/15/2011    CALCIUM 8.8 08/02/2013    GFRAA >60 08/02/2013    GFRAA >60 03/16/2012    LABGLOM >60 08/02/2013    GLUCOSE 123 08/02/2013     PT/INR:    Lab Results   Component Value Date    PROTIME 10.9 05/15/2011    INR 0.95 05/15/2011       Labs & X-rays results reviewed      ASSESSMENT AND PLAN    Principal Problem:    Nausea & vomiting  Active Problems:    Abdominal  pain, other specified site    Morbid obesity    Meniere syndrome    Complication of gastric banding    Wound infection (HCC)    HTN (hypertension)    Hyperglycemia        Home today  Note dictated

## 2013-08-02 NOTE — Progress Notes (Signed)
Patient transported via W/C to lobby for discharge to home.  Correction, patient did not have a medication filled in pharmacy.

## 2013-08-02 NOTE — Progress Notes (Signed)
Abdominal dressing and packing removed from wound.  Small amount of bloody drainage on dressing.  Iodoform gauze packed into wound and covered with 4x4's and secured with Medipore tape.  Patient tolerated well.

## 2013-08-02 NOTE — Progress Notes (Signed)
Medicated with Roxicet 10mg  po.

## 2013-08-02 NOTE — Progress Notes (Signed)
Assessment complete.  Patient rates pain "6/10".  Denies nausea.  PICC LUA D&I, flushes without difficulty.  JP drain patent with yellow drainage.  Call light in reach.

## 2013-08-02 NOTE — Plan of Care (Signed)
Problem: Nausea/Vomiting  Goal: Able to eat  Outcome: Ongoing  Patient eat a meal that her daughter brought in, no complaints of nausea.

## 2013-08-02 NOTE — Progress Notes (Signed)
Patient resting comfortably in bed.  Respirations easy and unlabored, no signs of distress.  Call light in reach.

## 2013-08-02 NOTE — Progress Notes (Signed)
[facility] Aware of discharge with home care. [facility] [phone] in place for home infusion.    Home care/TPN orders faxed.

## 2013-08-02 NOTE — Discharge Summary (Signed)
PATIENT NAME:                 PA #:            MR #Rachael Mays, Rachael Mays                 [id number]       [id number]            ATTENDING PHYSICIAN:                  ADM DATE:   DIS DATE:          Virl Cagey, MD               07/22/2013  08/02/2013         DATE OF BIRTH:   AGE:           PATIENT TYPE:                         02/23/1956       57             IPF                                      DISCHARGE DIAGNOSES:   1.  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  2.  Wound infection.  3.  Hypertension.  4.  Hyperglycemia.  5.  Menieres disease.  6.  Morbid obesity.  7.  Abdominal pain.  8.  Nausea and vomiting.  9.  Status post gastric banding.     DISCHARGE MEDICATIONS:   1.  Include Roxicet 5 to 10 mL q.i.d. p.r.n. for pain.  2.  Scopolamine patch every 72 hours.  3.  Clonidine 0.1 mg patch weekly.  4.  Milk of magnesia p.r.n.  5.  Carafate q.i.d.   6.  Phenergan 25 mg q.i.d. p.r.n.  7.  Prilosec 40 mg daily.  8.  TPN overnight.  9.  Zofran 4 mg t.i.d. p.r.n.  10.  Singulair 10 mg daily.  11.  Lidex cream p.r.n.  12.  Gabapentin 300 mg nightly.  13.  Zyrtec 10 mg daily.  14.  Xanax 0.5 mg b.i.d. p.r.n.  15.  Lexapro 20 mg daily.  16.  Albuterol inhaler 2 puffs q.i.d. p.r.n.     She was advised to stay off her Kayexalate, hydrochlorothiazide, ranitidine,  calcium and Augmentin.      FOLLOWUP:  She is going to follow up with surgery in the next week, Dr. Roderic Ovens  and with Dr. Daphine Deutscher.       HOSPITAL COURSE:  This is a patient who presented to the emergency room with  abdominal pain, nausea and vomiting.  She had a lap band removed 2 weeks  prior.  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  Since that time she has had difficulty with swallowing, keeping  fluids/liquids down.  She had a gastric sleeve placed 1 day prior to  admission but unfortunately she still continued to have persistent vomiting  and therefore required  admission to the hospital.  During the hospitalization  she received TPN, anti-emetics and pain medication.       She slowly but  surely improved throughout the hospitalization.  By the time  of discharge she was able to tolerate a soft diet, had no further emesis and  her pain was much more manageable.  We thought she was medically stable to be  discharged home and follow up accordingly.                                            Marnee Spring, MD     [id number]  DD: 08/02/2013 07:35   DT: 08/02/2013 09:05   Job #: [id number]  CC: Virl Cagey, MD  CC: Marnee Spring, MD  CC: Harold Barban, MD

## 2013-08-02 NOTE — Progress Notes (Signed)
Discharge instructions given to patient.  Patient verbalized understanding.  Copy of instructions along with medication filled in outpatient pharmacy and a prescription given to patient.

## 2013-08-05 NOTE — ED Notes (Signed)
Assisted pt up to bathroom. Small amount of emesis noted in pan - greenish color fluid. IV infusing without difficulty. Pain meds given per orders. Siderails x2 up with bed in lowest position and locked. Call bell in reach.    Pamala Hurry, RN  08/05/13 2155

## 2013-08-05 NOTE — ED Notes (Signed)
Pt resting with pain easing but still there. To be admitted to 5T - will call report. No vomiting noted at present. IV infused without difficulty.    Pamala Hurry, RN  08/05/13 [phone]

## 2013-08-05 NOTE — ED Notes (Signed)
MD in to examine pt. Blood drawn and sent to lab. CXR obtained. Pt with clear small amount of emesis noted in pan. Family at bedside.    Pamala Hurry, RN  08/05/13 2056

## 2013-08-05 NOTE — ED Provider Notes (Signed)
The history is provided by the patient.        [facility] ED  Emergency Department Encounter    Pt Name: Rachael Mays  MRN: [id number]  Birthdate 1956/01/23  Date of evaluation: 08/05/2013  Provider: Arline Asp, PA    Chief Complaint   Patient presents with   ??? Emesis     discharged on Wednesday after being in the hospital since around July 29; can not keep anything down; called PCP and told to come here for admission       Nursing Notes, Past Medical Hx, Past Surgical Hx, Social Hx, Allergies, and Family Hx were all reviewed and agreed with or any disagreements were addressed in the HPI.    HPI:  (Location, Duration, Timing, Severity, Quality, Assoc Sx, Context, Modifying factors)  This is a  57 y.o. female      [facility] ED  Emergency Department Encounter    Pt Name: Rachael Mays  MRN: [id number]  Birthdate May 16, 1956  Date of evaluation: 08/05/2013  Provider: Arline Asp, PA    Chief Complaint   Patient presents with   ??? Emesis     discharged on Wednesday after being in the hospital since around July 29; can not keep anything down; called PCP and told to come here for admission       Nursing Notes, Past Medical Hx, Past Surgical Hx, Social Hx, Allergies, and Family Hx were all reviewed and agreed with or any disagreements were addressed in the HPI.    HPI:  (Location, Duration, Timing, Severity, Quality, Assoc Sx, Context, Modifying factors)  This is a  57 y.o. female presents emergency department complaining of abdominal pain, nausea and vomiting.  She is unable to keep any fluids down.  She had surgery to place an esophageal stent early last month and since then she has had difficulty with nausea vomiting and abdominal pain.  She was recently admitted to the hospital on July 29 and was discharged 2 days ago.  She states that she got home and since has been having difficulty holding anything down.  She is having generalized abdominal pain that she rates as a 9/10.  It's  stabbing in crampy sensation.  She denies any blood in vomitus.  She has felt a little feverish and chilled today.  No cough or chest pain.  She contacted her family physician and was told to come to the hospital and be admitted since she is still unable to keep anything down.  She denies any other complaints.    Past Medical/Surgical History:      Diagnosis Date   ??? Anxiety    ??? Asthma    ??? Chronic back pain    ??? Deep vein thrombosis    ??? Depression    ??? GERD (gastroesophageal reflux disease)      NO LONGER SINCE LAP   ??? Pulmonary embolism (HCC)    ??? Obesity      hx of; had lap band   ??? Hyperlipidemia      hx; resolved with lap band   ??? GERD (gastroesophageal reflux disease) 01/23/2009   ??? Hypertension          Procedure Laterality Date   ??? Colonoscopy     ??? Upper gastrointestinal endoscopy     ??? Cesarean section     ??? Cholecystectomy     ??? Hysterectomy     ??? Shoulder surgery     ??? Lap band  05/01/08     Dr. Suzi Roots   ??? Back surgery       neck  plates   ??? Colonoscopy  04/08/10   ??? Spine surgery     ??? Other surgical history       [place] Filter: curently in place   ??? Upper gastrointestinal endoscopy  05/19/13   ??? Other surgical history  07/05/13     lap band removal   ??? Upper gastrointestinal endoscopy  07/21/13     ESOPHAGEAL STENT PLACEMENT   ??? Upper gastrointestinal endoscopy  07/21/13     WITH EXCHANGE OF ESOPHAGEAL STENT       Medications:  No current facility-administered medications on file prior to encounter.     Current Outpatient Prescriptions on File Prior to Encounter   Medication Sig Dispense Refill   ??? oxyCODONE-acetaminophen (ROXICET) 5-325 MG/5ML solution 5-10 mL Q4 hr prn pain  300 mL  0   ??? scopolamine (TRANSDERM-SCOP) 1.5 MG Place 1 patch onto the skin every 72 hours.  3 patch  12   ??? promethazine (PHENERGAN) 25 MG tablet Take 1 tablet by mouth every 6 hours as needed for Nausea.  40 tablet  0   ??? cloNIDine (CATAPRES) 0.1 MG/24HR Place 1 patch onto the skin once a week.  4 patch  3   ??? magnesium  hydroxide (MILK OF MAGNESIA) 400 MG/5ML suspension Take 30 mLs by mouth daily as needed for Constipation.       ??? sucralfate (CARAFATE) 1 GM tablet Take 1 tablet by mouth every 6 hours.  120 tablet  3   ??? omeprazole (PRILOSEC) 40 MG capsule Take 1 capsule by mouth daily.  30 capsule  3   ??? NONFORMULARY TPN overnight per picc line       ??? enoxaparin (LOVENOX) 30 MG/0.3ML injection Inject 0.4 mLs into the skin daily.  40 mL  0   ??? ondansetron (ZOFRAN) 4 MG tablet Take 1 tablet by mouth every 8 hours as needed for Nausea or Vomiting.  30 tablet  0   ??? montelukast (SINGULAIR) 10 MG tablet Take 1 tablet by mouth daily.  30 tablet  3   ??? fluocinonide (LIDEX) 0.05 % cream Apply topically 2 times daily.  1 Tube  1   ??? Wound Dressings (INTERDRY AG TEXTILE 10"X12') MISC Apply 1 each topically daily. Pt would like a call with price before filled  1 each  1   ??? cetirizine (ZYRTEC ALLERGY) 10 MG tablet Take 10 mg by mouth daily.       ??? ALPRAZolam (XANAX) 0.5 MG tablet Take 1 tablet by mouth 2 times daily as needed for Sleep or Anxiety.  60 tablet  0   ??? escitalopram (LEXAPRO) 20 MG tablet Take 20 mg by mouth nightly.       ??? albuterol (PROVENTIL;VENTOLIN) 90 MCG/ACT inhaler Inhale 2 puffs into the lungs every 6 hours as needed.             Past Medical/Surgical History:      Diagnosis Date   ??? Anxiety    ??? Asthma    ??? Chronic back pain    ??? Deep vein thrombosis    ??? Depression    ??? GERD (gastroesophageal reflux disease)      NO LONGER SINCE LAP   ??? Pulmonary embolism (HCC)    ??? Obesity      hx of; had lap band   ??? Hyperlipidemia      hx;  resolved with lap band   ??? GERD (gastroesophageal reflux disease) 01/23/2009   ??? Hypertension          Procedure Laterality Date   ??? Colonoscopy     ??? Upper gastrointestinal endoscopy     ??? Cesarean section     ??? Cholecystectomy     ??? Hysterectomy     ??? Shoulder surgery     ??? Lap band  05/01/08     Dr. Suzi Roots   ??? Back surgery       neck  plates   ??? Colonoscopy  04/08/10   ??? Spine surgery     ???  Other surgical history       [place] Filter: curently in place   ??? Upper gastrointestinal endoscopy  05/19/13   ??? Other surgical history  07/05/13     lap band removal   ??? Upper gastrointestinal endoscopy  07/21/13     ESOPHAGEAL STENT PLACEMENT   ??? Upper gastrointestinal endoscopy  07/21/13     WITH EXCHANGE OF ESOPHAGEAL STENT       Medications:  No current facility-administered medications on file prior to encounter.     Current Outpatient Prescriptions on File Prior to Encounter   Medication Sig Dispense Refill   ??? oxyCODONE-acetaminophen (ROXICET) 5-325 MG/5ML solution 5-10 mL Q4 hr prn pain  300 mL  0   ??? scopolamine (TRANSDERM-SCOP) 1.5 MG Place 1 patch onto the skin every 72 hours.  3 patch  12   ??? promethazine (PHENERGAN) 25 MG tablet Take 1 tablet by mouth every 6 hours as needed for Nausea.  40 tablet  0   ??? cloNIDine (CATAPRES) 0.1 MG/24HR Place 1 patch onto the skin once a week.  4 patch  3   ??? magnesium hydroxide (MILK OF MAGNESIA) 400 MG/5ML suspension Take 30 mLs by mouth daily as needed for Constipation.       ??? sucralfate (CARAFATE) 1 GM tablet Take 1 tablet by mouth every 6 hours.  120 tablet  3   ??? omeprazole (PRILOSEC) 40 MG capsule Take 1 capsule by mouth daily.  30 capsule  3   ??? NONFORMULARY TPN overnight per picc line       ??? enoxaparin (LOVENOX) 30 MG/0.3ML injection Inject 0.4 mLs into the skin daily.  40 mL  0   ??? ondansetron (ZOFRAN) 4 MG tablet Take 1 tablet by mouth every 8 hours as needed for Nausea or Vomiting.  30 tablet  0   ??? montelukast (SINGULAIR) 10 MG tablet Take 1 tablet by mouth daily.  30 tablet  3   ??? fluocinonide (LIDEX) 0.05 % cream Apply topically 2 times daily.  1 Tube  1   ??? Wound Dressings (INTERDRY AG TEXTILE 10"X12') MISC Apply 1 each topically daily. Pt would like a call with price before filled  1 each  1   ??? cetirizine (ZYRTEC ALLERGY) 10 MG tablet Take 10 mg by mouth daily.       ??? ALPRAZolam (XANAX) 0.5 MG tablet Take 1 tablet by mouth 2 times daily as needed  for Sleep or Anxiety.  60 tablet  0   ??? escitalopram (LEXAPRO) 20 MG tablet Take 20 mg by mouth nightly.       ??? albuterol (PROVENTIL;VENTOLIN) 90 MCG/ACT inhaler Inhale 2 puffs into the lungs every 6 hours as needed.             Review of Systems   Constitutional: Positive for fever and chills.  HENT: Negative for neck pain and neck stiffness.    Respiratory: Negative for shortness of breath.    Cardiovascular: Negative for chest pain.   Gastrointestinal: Positive for nausea, vomiting and abdominal pain. Negative for diarrhea.   Musculoskeletal: Negative for back pain.   Skin: Negative for rash.   Neurological: Negative for dizziness, light-headedness and headaches.   All other systems reviewed and are negative.        Physical Exam   Constitutional: She is cooperative.  Non-toxic appearance.   HENT:   Mouth/Throat: Mucous membranes are normal.   Eyes: Conjunctivae are normal. No scleral icterus.   Neck: Phonation normal. Neck supple.   Cardiovascular: Normal rate and regular rhythm.    Pulmonary/Chest: Effort normal and breath sounds normal.   Abdominal: Soft. Bowel sounds are normal. There is generalized tenderness. There is no rigidity, no rebound and no guarding.   Neurological: She is alert.   Skin: Skin is warm and dry.   Psychiatric: She has a normal mood and affect.   Nursing note and vitals reviewed.      Procedures    MDM    MEDICAL DECISION MAKING    Vitals:    Filed Vitals:    08/05/13 2017 08/05/13 2150   BP: 137/63 132/65   Pulse: 80 91   Temp: 99 ??F (37.2 ??C)    TempSrc: Oral    Resp: 16 18   Height: 5\' 6"  (1.676 m)    Weight: 215 lb (97.523 kg)    SpO2: 97% 95%       LABS:   Labs Reviewed   HEPATIC FUNCTION PANEL - Abnormal; Notable for the following:     Alb 3.2 (*)     Alkaline Phosphatase 150 (*)     ALT 71 (*)     AST 51 (*)     All other components within normal limits   CBC WITH AUTO DIFFERENTIAL   LIPASE   URINALYSIS   POCT CHEM BASIC W ICA   POCT VENOUS        Remainder of labs reviewed and  were negative at this time or not returned at the time of this note.    Orders:  Orders Placed This Encounter   Procedures   ??? XR Chest Portable   ??? CBC Auto Differential   ??? Lipase   ??? Hepatic Function Panel   ??? Urinalysis   ??? Inpatient consult to Internal Medicine   ??? POCT chem basic w/ ICA   ??? POCT Venous   ??? Patient status Admit as Inpatient       EKG:   none    X-RAYS:   none        MEDICAL DECISION MAKING / ED COURSE:    Patient was given the following medications:  Medications   0.9 % sodium chloride infusion (1,000 mL/hr Intravenous New Bag 08/05/13 2140)   HYDROmorphone HCl PF (DILAUDID) injection SOLN 1 mg (1 mg Intravenous Given 08/05/13 2149)   ondansetron (ZOFRAN) injection 4 mg (4 mg Intravenous Given 08/05/13 2149)       The patient continues to have persistent nausea and some dry heaves here even though she was given Zofran.  I discussed the case with Dr. Mack Hook her family physician and she will be admitted.    The patient tolerated their visit well.  They were seen and evaluated by the attending physician who agreed with the assessment and plan.  The patient and / or the family  were informed of the results of any tests, a time was given to answer questions, a plan was proposed and they agreed with plan.      CLINICAL IMPRESSION:    1. Intractable vomiting    2. Intractable abdominal pain        DISPOSITION Admitted    PATIENT REFERRED TO:  Lum Keas, MD  [address] [place] [address]  [phone]            DISCHARGE MEDICATIONS:  New Prescriptions    No medications on file       (Please note the MDM and HPI sections of this note were completed with a voice recognition program.  Efforts were made to edit the dictations but occasionally words are mis-transcribed.Arline Asp, PA  08/05/13 [phone]

## 2013-08-05 NOTE — ED Notes (Signed)
Report given to 5T RN - Alyssa. Pt to be transferred to RM 575.    Pamala Hurry, RN  08/05/13 [phone]

## 2013-08-05 NOTE — Progress Notes (Signed)
Pt arrived to RM 5575 in stable condition. Pt complaining of 9/10 abdominal pain. Admission complete. Family at bedside. Pt oriented to room and surroundings. Call light in reach. Will cont to monitor.

## 2013-08-05 NOTE — ED Provider Notes (Signed)
I have personally performed a face to face diagnostic evaluation on this patient. I have fully participated in the care of this patient. I have reviewed and agree with all pertinent clinical information including history, physical exam, diagnostic tests, and the plan.     HPI: Rachael Mays presented with   Chief Complaint   Patient presents with   ??? Emesis     discharged on Wednesday after being in the hospital since around July 29; can not keep anything down; called PCP and told to come here for admission     Review of Systems: See PA note  Vital Signs: BP 137/63   Pulse 80   Temp(Src) 99 ??F (37.2 ??C) (Oral)   Resp 16   Ht 5\' 6"  (1.676 m)   Wt 215 lb (97.523 kg)   BMI 34.72 kg/m2   SpO2 97%    Alert 57 y.o. female who does not appear toxic or acutely ill  HENT: Atraumatic, oral mucosa moist  Neck: Grossly normal ROM  Chest/Lungs: respiratory effort normal , LCTAB  HRRR  Abdomen: soft, NT, ND  Musculoskeletal: Grossly normal ROM  Skin: No palor or diaphoresis    Medical Decision Making and Plan:  Pertinent Labs & Imaging studies reviewed. (See chart for details)  I evaluated this patient at 2040.    Patient with, but patient is from lap band removal with ulceration of stomach and esophagus.  She is on home TPN.  She is had intractable nausea and vomiting for last 2 days.  We'll obtain baseline labs.  Will contact primary care for possible admission.    X-ray chest single view interpreted by myself in the absence of a radiologist shows normal mediastinum, PICC line in place in the superior vena cava, no acute infiltrate, no failure, no collapsed lung.  I agree with PA Kendell Bane assessment and plan.        Tonna Boehringer, MD  08/05/13 2145

## 2013-08-06 ENCOUNTER — Inpatient Hospital Stay: Admit: 2013-08-06 | Discharge: 2013-08-06 | Source: Home / Self Care | Admitting: Family Medicine

## 2013-08-06 LAB — URINALYSIS
Bilirubin Urine: NEGATIVE mg/dL
Blood, Urine: NEGATIVE
Glucose, Ur: NEGATIVE mg/dL
Ketones, Urine: NEGATIVE mg/dL
Leukocyte Esterase, Urine: NEGATIVE
Nitrite, Urine: NEGATIVE
Protein, UA: NEGATIVE mg/dL
Specific Gravity, UA: 1.025 (ref 1.005–1.030)
Urobilinogen, Urine: 1 E.U./dL (ref <2.0)
pH, UA: 6 (ref 5.0–8.0)

## 2013-08-06 LAB — CBC WITH AUTO DIFFERENTIAL
Basophils %: 0.6 %
Basophils Absolute: 0.1 10*3/uL (ref 0.0–0.2)
Eosinophils %: 2 %
Eosinophils Absolute: 0.2 10*3/uL (ref 0.0–0.6)
Hematocrit: 38.6 % (ref 36.0–48.0)
Hemoglobin: 12.4 g/dL (ref 12.0–16.0)
Lymphocytes %: 18.1 %
Lymphocytes Absolute: 1.5 10*3/uL (ref 1.0–5.1)
MCH: 27.5 pg (ref 26.0–34.0)
MCHC: 32.2 g/dL (ref 31.0–36.0)
MCV: 85.3 fL (ref 80.0–100.0)
MPV: 8.5 fL (ref 5.0–10.5)
Monocytes %: 7.3 %
Monocytes Absolute: 0.6 10*3/uL (ref 0.0–1.3)
Neutrophils %: 72 %
Neutrophils Absolute: 6.1 10*3/uL (ref 1.7–7.7)
Platelets: 228 10*3/uL (ref 135–450)
RBC: 4.53 M/uL (ref 4.00–5.20)
RDW: 14.6 % (ref 12.4–15.4)
WBC: 8.5 10*3/uL (ref 4.0–11.0)

## 2013-08-06 LAB — HEPATIC FUNCTION PANEL
ALT: 71 U/L — ABNORMAL HIGH (ref 10–40)
AST: 51 U/L — ABNORMAL HIGH (ref 15–37)
Albumin: 3.2 g/dL — ABNORMAL LOW (ref 3.4–5.0)
Alkaline Phosphatase: 150 U/L — ABNORMAL HIGH (ref 40–129)
Bilirubin, Direct: 0.1 mg/dL (ref 0.0–0.3)
Bilirubin, Indirect: 0.3 mg/dL (ref 0.0–1.0)
Total Bilirubin: 0.4 mg/dL (ref 0.0–1.0)
Total Protein: 7 g/dL (ref 6.4–8.2)

## 2013-08-06 LAB — POCT VENOUS
CO2: 21 mEq/L (ref 21–32)
Calcium, Ionized: 1.15 mmol/L (ref 1.12–1.32)
GFR African American: >60 (ref >60)
GFR Non-African American: >60 (ref >60)
POC BUN: 17 mg/dL (ref 7–18)
POC Chloride: 104 mEq/L (ref 99–110)
POC Creatinine: 0.8 mg/dL (ref 0.6–1.1)
POC Glucose: 99 mg/dl (ref 70–99)
POC Potassium: 4 mEq/L (ref 3.5–5.1)
POC Sodium: 138 mEq/L (ref 136–145)

## 2013-08-06 LAB — BASIC METABOLIC PANEL
BUN: 13 mg/dL (ref 7–20)
CO2: 25 mmol/L (ref 21–32)
Calcium: 8.9 mg/dL (ref 8.3–10.6)
Chloride: 101 mmol/L (ref 99–110)
Creatinine: 0.6 mg/dL (ref 0.6–1.1)
GFR African American: >60 (ref >60)
GFR Non-African American: >60 (ref >60)
Glucose: 99 mg/dL (ref 70–99)
Potassium: 3.6 mmol/L (ref 3.5–5.1)
Sodium: 135 mmol/L — ABNORMAL LOW (ref 136–145)

## 2013-08-06 LAB — LIPASE: Lipase: 32 U/L (ref 13.0–60.0)

## 2013-08-06 MED ORDER — ACETAMINOPHEN 325 MG PO TABS
325 | ORAL | Status: DC | PRN
Start: 2013-08-06 — End: 2013-08-20

## 2013-08-06 MED ORDER — HYDROMORPHONE HCL PF 1 MG/ML IJ SOLN
1 | INTRAMUSCULAR | Status: DC | PRN
Start: 2013-08-06 — End: 2013-08-20
  Administered 2013-08-06 – 2013-08-20 (×73): 1 mg via INTRAVENOUS

## 2013-08-06 MED ADMIN — 0.9 % sodium chloride infusion: INTRAVENOUS | @ 02:00:00 | NDC 00338004904

## 2013-08-06 MED ADMIN — sucralfate (CARAFATE) 1 GM/10ML suspension 1 g: ORAL | @ 21:00:00 | NDC 68094017159

## 2013-08-06 MED ADMIN — montelukast (SINGULAIR) tablet 10 mg: ORAL | @ 13:00:00 | NDC 68084062011

## 2013-08-06 MED ADMIN — citalopram (CELEXA) tablet 40 mg: ORAL | @ 13:00:00 | NDC 68084074411

## 2013-08-06 MED ADMIN — dextrose 5 % and 0.9 % NaCl with KCl 20 mEq infusion: INTRAVENOUS | @ 19:00:00 | NDC 00338080304

## 2013-08-06 MED ADMIN — sodium chloride flush 0.9 % injection 10 mL: INTRAVENOUS | @ 13:00:00

## 2013-08-06 MED ADMIN — dextrose 5 % and 0.9 % NaCl with KCl 20 mEq infusion: INTRAVENOUS | @ 04:00:00 | NDC 00338080304

## 2013-08-06 MED ADMIN — sucralfate (CARAFATE) tablet 1 g: ORAL | @ 15:00:00 | NDC 00591078001

## 2013-08-06 MED ADMIN — promethazine (PHENERGAN) injection 25 mg: INTRAVENOUS | @ 21:00:00 | NDC 00641092821

## 2013-08-06 MED ADMIN — ondansetron (ZOFRAN) injection 4 mg: INTRAVENOUS | @ 02:00:00 | NDC 23155037831

## 2013-08-06 MED ADMIN — pantoprazole (PROTONIX) tablet 40 mg: ORAL | @ 13:00:00 | NDC 68084065711

## 2013-08-06 MED ADMIN — ondansetron (ZOFRAN) injection 4 mg: INTRAVENOUS | @ 17:00:00 | NDC 23155037831

## 2013-08-06 MED ADMIN — promethazine (PHENERGAN) injection 25 mg: INTRAVENOUS | @ 06:00:00 | NDC 00641092821

## 2013-08-06 MED ADMIN — enoxaparin (LOVENOX) injection 40 mg: SUBCUTANEOUS | @ 04:00:00 | NDC 00075801301

## 2013-08-06 MED ADMIN — PN-Adult Premix 5/20 - Standard Electrolytes - Central Line: INTRAVENOUS | @ 22:00:00 | NDC 00338112504

## 2013-08-06 MED ADMIN — cloNIDine (CATAPRES) 0.1 MG/24HR 1 patch: TRANSDERMAL | @ 13:00:00 | NDC 00555100916

## 2013-08-06 MED ADMIN — promethazine (PHENERGAN) injection 25 mg: INTRAVENOUS | @ 15:00:00 | NDC 00641092821

## 2013-08-06 MED ADMIN — HYDROmorphone HCl PF (DILAUDID) injection SOLN 1 mg: INTRAVENOUS | @ 02:00:00 | NDC 00409128331

## 2013-08-06 MED ADMIN — cetirizine (ZYRTEC) tablet 10 mg: ORAL | @ 13:00:00 | NDC 51079059701

## 2013-08-06 MED FILL — NORMAL SALINE FLUSH 0.9 % IV SOLN: 0.9 % | INTRAVENOUS | Qty: 10

## 2013-08-06 MED FILL — SODIUM CHLORIDE 0.9 % IV SOLN: 0.9 % | INTRAVENOUS | Qty: 1000

## 2013-08-06 MED FILL — HYDROMORPHONE HCL PF 1 MG/ML IJ SOLN: 1 MG/ML | INTRAMUSCULAR | Qty: 1

## 2013-08-06 MED FILL — ONDANSETRON HCL 4 MG/2ML IJ SOLN: 4 MG/2ML | INTRAMUSCULAR | Qty: 2

## 2013-08-06 MED FILL — PROMETHAZINE HCL 25 MG/ML IJ SOLN: 25 MG/ML | INTRAMUSCULAR | Qty: 1

## 2013-08-06 MED FILL — NORMAL SALINE FLUSH 0.9 % IV SOLN: 0.9 % | INTRAVENOUS | Qty: 30

## 2013-08-06 MED FILL — KCL IN DEXTROSE-NACL 20-5-0.9 MEQ/L-%-% IV SOLN: INTRAVENOUS | Qty: 1000

## 2013-08-06 MED FILL — SUCRALFATE 1 G PO TABS: 1 GM | ORAL | Qty: 1

## 2013-08-06 MED FILL — PANTOPRAZOLE SODIUM 40 MG PO TBEC: 40 MG | ORAL | Qty: 1

## 2013-08-06 MED FILL — SINGULAIR 10 MG PO TABS: 10 MG | ORAL | Qty: 1

## 2013-08-06 MED FILL — CLONIDINE HCL 0.1 MG/24HR TD PTWK: 0.1 MG/24HR | TRANSDERMAL | Qty: 1

## 2013-08-06 MED FILL — PROAIR HFA 108 (90 BASE) MCG/ACT IN AERS: 108 (90 Base) MCG/ACT | RESPIRATORY_TRACT | Qty: 8.5

## 2013-08-06 MED FILL — SUCRALFATE 1 GM/10ML PO SUSP: 1 GM/0ML | ORAL | Qty: 10

## 2013-08-06 MED FILL — CETIRIZINE HCL 10 MG PO TABS: 10 MG | ORAL | Qty: 1

## 2013-08-06 MED FILL — CITALOPRAM HYDROBROMIDE 20 MG PO TABS: 20 MG | ORAL | Qty: 2

## 2013-08-06 MED FILL — CLINIMIX E/DEXTROSE (5/20) 5 % IV SOLN: 5 % | INTRAVENOUS | Qty: 2000

## 2013-08-06 MED FILL — LOVENOX 30 MG/0.3ML SC SOLN: 30 MG/0.3ML | SUBCUTANEOUS | Qty: 0.6

## 2013-08-06 NOTE — Plan of Care (Signed)
Problem: Falls - Risk of  Goal: Absence of falls  Patient free of falls this shift. Bed low, locked, and alarmed at all times. Call light is always within reach. Orange blanket on bed, arm band on wrist, and fall sign posted in the room.

## 2013-08-06 NOTE — Progress Notes (Signed)
Spoke with Dr. Mack Hook about patient NPO status, wants her to remain NPO. Patient aware and agreeable.

## 2013-08-06 NOTE — Progress Notes (Signed)
Patient reported nausea, Phenergan given. Dr. Rozanna Boer ordered JP drain to come out. JP drain removed, pt tolerated well, dry dressing applied. Sterile dressing change done to surgical wound on abdomen. Pt resting in bed, denies any needs at this time. Call light within reach, will continue to monitor.

## 2013-08-06 NOTE — Progress Notes (Signed)
Shift assessment complete. VSS, breath sounds clear and unlabored. Dressing over JP drain, clean and intact. Dressing over surgical wound intact with scant drainage. Morning medications given. Pt reports pain 10/10, medication given, see MAR. Patient resting in bed, denies any needs at this time. Call light within reach, will continue to monitor.

## 2013-08-06 NOTE — Plan of Care (Signed)
Problem: Falls - Risk of  Goal: Absence of falls  Outcome: Ongoing  Fall assessment completed this shift.  Bed/chair alarm on entire shift. Non skid slippers on.  Call light in reach.        Problem: Risk for Impaired Skin Integrity  Goal: Tissue integrity - skin and mucous membranes  Structural intactness and normal physiological function of skin and  mucous membranes.   Outcome: Ongoing  Skin assessment completed, pt turns self, encouraged pt to reposition Q2 hours.

## 2013-08-06 NOTE — Progress Notes (Signed)
Bedside handoff completed with Hansel Starling, RN. Pt resting in bed, denies any needs at this time.

## 2013-08-06 NOTE — Progress Notes (Signed)
PRN dilaudid given for 10/10 abdominal pain. See eMAR. Labs drawn from PICC without difficulty. Sample sent to lab. Call light in reach. Bed alarm engaged. Will cont to monitor.

## 2013-08-06 NOTE — Progress Notes (Signed)
Pt seen and examined  S/p lap band removal for erosion   S/p endo stent   Still having nausea  Left port site wound healing well and almost healed  Recommend GI evaluation   No new  surgical recs at this point

## 2013-08-06 NOTE — Progress Notes (Signed)
Called Dr. Mack Hook regarding BS at 175 and no insulin orders while on TPN. Received order for low dose humalog every 6 hours, see eMAR.

## 2013-08-06 NOTE — Progress Notes (Signed)
PRN phenergan given for nausea. See eMAR.

## 2013-08-06 NOTE — Progress Notes (Signed)
Prn dilaudid and phenergan administered per pt request for pain and nausea, will monitor.

## 2013-08-06 NOTE — Progress Notes (Signed)
Pt reported nausea and pain 9/10, Zofran and Dilaudid given. Patient up to shower with assistance from daughters. Returned to bed, bed alarm on. Denies any needs at this time. Call light within reach, will continue to monitor.

## 2013-08-06 NOTE — Progress Notes (Signed)
VSS. BS 138 requiring no insulin at this time, will monitor.

## 2013-08-06 NOTE — Progress Notes (Signed)
Bedside handoff complete with Alyssa, RN. Patient in bed resting with eyes closed. Call light within reach, will continue to monitor.

## 2013-08-06 NOTE — Plan of Care (Signed)
Problem: Falls - Risk of  Goal: Absence of falls  Outcome: Ongoing  Pt successfully demonstrates steady ambulation with no weakness noted. Pt educated to notify this RN with any assistance needed, verbalizes understanding.     Problem: Risk for Impaired Skin Integrity  Goal: Tissue integrity - skin and mucous membranes  Structural intactness and normal physiological function of skin and  mucous membranes.   Outcome: Ongoing  Pt able to turn self to prevent skin breakdown, educated to notify this RN with any assistance with turning needed, will monitor.

## 2013-08-06 NOTE — Progress Notes (Signed)
Scheduled medications given per order. PRN dilaudid given for 9/10 pain. See eMAR. Dressing changed to L lower abdominal wound. Call light in reach. Bed alarm engaged. Will cont to monitor.

## 2013-08-06 NOTE — Progress Notes (Signed)
Shift assessment complete, VSS, pt c/o of pain 9/10 in abdomen, will administer pain med when available, A&O. Respirations even and unlabored, encouraged to cough and deep breathe. Positive bowel sounds in all four quadrants, pt experiencing nausea, prn Zofran administered. BS 175, page to MD regarding insulin orders if needed. Wound dressing to abdomen clean and intact. Plan of care discussed with pt, pt denies any further needs at this time, call light within reach.

## 2013-08-06 NOTE — H&P (Signed)
PATIENT NAME:                 PA #:            MR #Rachael Mays, Rachael Mays                 [id number]       [id number]            ATTENDING PHYSICIAN:                  ADM DATE:   DIS DATE:          Lavena Bullion SLOUGH, MD              08/05/2013                     DATE OF BIRTH:   AGE:           PATIENT TYPE:     RM #:              21-Sep-1956       57             IPF               [id number]                  REASON FOR ADMISSION:  Persistent abdominal pain with intractable nausea and  emesis.      HISTORY OF PRESENT ILLNESS:  The patient is a 57 year old female with a past  medical history significant for pulmonary embolism, morbid obesity,  hypertension, hyperlipidemia.  She also has a history of a Lap-Band procedure  and underwent a laparoscopic adjustable band removal and lysis of adhesions  on July 05, 2013, by Dr. Suzi Roots.  Since that time, the patient has had  considerable difficulty with recovery.  She has required EGD with esophageal  stent placement.  That was done by Dr. Daphine Deutscher on July 21, 2013.  The  patient has had persistent abdominal pain, nausea, and emesis since her  surgery.  She has required TPN which has been given even at home.  On the day  of admission the patient presented to the emergency room with a 24-hour  history of increased nausea and emesis and persistent abdominal pain.  She  was unable to tolerate anything by mouth and is on TPN; however, she  continues to have dry heaves.  The patient denies fever.  She has not had any  cough or shortness of breath.  She has not had any wheezing. She has not had  any bowel changes. She does report persistent abdominal pain, especially in  the epigastric area, but can radiate lower actually down the length of the  stomach down to the pubic area.       REVIEW OF SYSTEMS:   Negative, except for what is described in HPI.      PAST MEDICAL HISTORY:  Significant for PE, morbid obesity, vertigo,  hypertension, hyperlipidemia.      PAST SURGICAL  HISTORY:  Lap-Band in 2009, back surgery, [place] filter  placement, Lap-Band removal July 05, 2013, EGD with stent July 21, 2013.      FAMILY HISTORY:  Mom with a history of arthritis, cancer, depression and  hypertension.  Father with a history of colon cancer.       SOCIAL HISTORY:  The patient  smokes a half pack a day for 35 years.  No  alcohol use.       ALLERGIES:  POLLEN.      MEDICATIONS PRIOR TO ADMISSION:   1.  Roxicet solution 5/325.   2.  Transdermal scopolamine patch 1.5 mg every 72 hours.   3.  Phenergan 25 mg orally every 6 hours.   4.  Catapres 0.1 mg every 24 hours weekly.   5.  Milk of Magnesia as needed.   6.  Carafate 1 g every 6 hours.   7.  Prilosec 40 mg daily.   8.  Lovenox 40 mg subcutaneous daily.   9.  Zofran 4 mg every 8 hours as needed.   10.  Singulair 10 mg daily.   11.  Lidex.   12.  Zyrtec 10 mg daily.   13.  Xanax 0.5 mg b.i.d.   14.  Lexapro 20 mg nightly.   15.  Proventil inhaler 2 puffs inhaled every 6 hours as needed.   16.  Interdry topical.      PHYSICAL EXAMINATION:  VITAL SIGNS:  Temperature is 98.5, T-max since admission 100.2, respiratory  rate 16, pulse 76, blood pressure 142/80, O2 saturation is 96% on room air.   GENERAL:  The patient is alert and in no acute distress.  She appears  comfortable and is conversational.    CARDIOVASCULAR:  Regular rate and rhythm.  No murmur.   RESPIRATORY:  Lungs are clear to auscultation bilaterally.    ABDOMEN:  Positive bowel sounds, soft, nontender, obese.  Dressings in place  status post surgery removing post surgical drain this morning.   EXTREMITIES:  No edema of bilateral lower extremities.    SKIN:  Without rash.   NEUROLOGIC:  Cranial nerves 2 through 12 are grossly intact.   PSYCHIATRIC:  Normal mood and affect.       LABORATORY AND IMAGING STUDIES:  CBC on admission:  White count of 8500,  hemoglobin of 12.4 and 38.6, platelets of 226.  Lipase was normal at 32.   LFTs were slightly out of range.  ALT and AST were at 71  and 51 respectively.   Alkaline phosphatase elevated at 150, albumin low at 3.2.  Basic metabolic  panel:  Sodium 135, potassium 3.6, chloride 101, CO2 of 25, BUN 13 and  creatinine 0.6 respectively.  GFR of  greater than 60 and calcium of 8.9.     Imaging studies since admission:  Chest x-ray showed good position of the  left-sided PICC line, no evidence of pneumothorax.      ASSESSMENT AND PLAN:  The patient is a 57 year old female who has had  persistent nausea, vomiting and abdominal pain since Lap-Band removal in July  of 2014.  She is readmitted for the same.  She has past medical history  significant for pulmonary embolism, obesity, hypertension, hyperlipidemia.   1.  Abdominal pain.  The patient has been made NPO.  She is being hydrated  with IV fluids until her TPN can be arranged.  Vomiting and nutrition and  surgery have been consulted for this.  She has been given Dilaudid as needed  for pain and is currently comfortable.  We will ask GI to see the patient in  consultation so they can continue their management.  Their care, of course,  is appreciated.   2.  Nausea. Continue antiemetics.  The patient, again, comfortable this  morning.  Dutch Gray SLOUGH, MD     [id number]  DD: 08/06/2013 12:55   DT: 08/06/2013 15:19   Job #: [id number]  CC: Dutch Gray SLOUGH, MD  CC: Harold Barban, MD

## 2013-08-06 NOTE — Progress Notes (Signed)
Patient reporting pain 8/10, Dilaudid given. Transport here to take pt to X-ray.

## 2013-08-07 LAB — POCT GLUCOSE
POC Glucose: 175 mg/dl — ABNORMAL HIGH (ref 70–99)
POC Glucose: 138 mg/dl — ABNORMAL HIGH (ref 70–99)
POC Glucose: 136 mg/dl — ABNORMAL HIGH (ref 70–99)
POC Glucose: 84 mg/dl (ref 70–99)
POC Glucose: 98 mg/dl (ref 70–99)
POC Glucose: 93 mg/dl (ref 70–99)

## 2013-08-07 LAB — BASIC METABOLIC PANEL
BUN: 15 mg/dL (ref 7–20)
CO2: 26 mmol/L (ref 21–32)
Calcium: 8.2 mg/dL — ABNORMAL LOW (ref 8.3–10.6)
Chloride: 103 mmol/L (ref 99–110)
Creatinine: 0.5 mg/dL — ABNORMAL LOW (ref 0.6–1.1)
GFR African American: >60 (ref >60)
GFR Non-African American: >60 (ref >60)
Glucose: 120 mg/dL — ABNORMAL HIGH (ref 70–99)
Potassium: 3.7 mmol/L (ref 3.5–5.1)
Sodium: 135 mmol/L — ABNORMAL LOW (ref 136–145)

## 2013-08-07 LAB — CBC WITH AUTO DIFFERENTIAL
Basophils %: 0.4 %
Basophils Absolute: 0 10*3/uL (ref 0.0–0.2)
Eosinophils %: 3.7 %
Eosinophils Absolute: 0.3 10*3/uL (ref 0.0–0.6)
Hematocrit: 32.8 % — ABNORMAL LOW (ref 36.0–48.0)
Hemoglobin: 10.7 g/dL — ABNORMAL LOW (ref 12.0–16.0)
Lymphocytes %: 21.9 %
Lymphocytes Absolute: 2 10*3/uL (ref 1.0–5.1)
MCH: 27.5 pg (ref 26.0–34.0)
MCHC: 32.7 g/dL (ref 31.0–36.0)
MCV: 84.3 fL (ref 80.0–100.0)
MPV: 8.3 fL (ref 5.0–10.5)
Monocytes %: 10.7 %
Monocytes Absolute: 1 10*3/uL (ref 0.0–1.3)
Neutrophils %: 63.3 %
Neutrophils Absolute: 5.7 10*3/uL (ref 1.7–7.7)
Platelets: 187 10*3/uL (ref 135–450)
RBC: 3.9 M/uL — ABNORMAL LOW (ref 4.00–5.20)
RDW: 14.2 % (ref 12.4–15.4)
WBC: 9 10*3/uL (ref 4.0–11.0)

## 2013-08-07 LAB — PHOSPHORUS: Phosphorus: 3.3 mg/dL (ref 2.5–4.9)

## 2013-08-07 LAB — MAGNESIUM: Magnesium: 1.8 mg/dL (ref 1.80–2.40)

## 2013-08-07 MED ADMIN — PN-Adult Premix 5/20 - Standard Electrolytes - Central Line: INTRAVENOUS | @ 22:00:00 | NDC 63323014397

## 2013-08-07 MED ADMIN — gabapentin (NEURONTIN) capsule 300 mg: ORAL | @ 01:00:00 | NDC 68084056311

## 2013-08-07 MED ADMIN — sodium chloride flush 0.9 % injection 10 mL: INTRAVENOUS | @ 01:00:00

## 2013-08-07 MED ADMIN — promethazine (PHENERGAN) injection 25 mg: INTRAVENOUS | @ 04:00:00 | NDC 00641092821

## 2013-08-07 MED ADMIN — sucralfate (CARAFATE) 1 GM/10ML suspension 1 g: ORAL | @ 22:00:00 | NDC 68094017159

## 2013-08-07 MED ADMIN — promethazine (PHENERGAN) injection 25 mg: INTRAVENOUS | @ 13:00:00 | NDC 00641092821

## 2013-08-07 MED ADMIN — ALPRAZolam (XANAX) tablet 0.5 mg: ORAL | @ 06:00:00 | NDC 68084067211

## 2013-08-07 MED ADMIN — ondansetron (ZOFRAN) injection 4 mg: INTRAVENOUS | @ 01:00:00 | NDC 23155037831

## 2013-08-07 MED ADMIN — sucralfate (CARAFATE) 1 GM/10ML suspension 1 g: ORAL | @ 04:00:00 | NDC 66689079001

## 2013-08-07 MED ADMIN — sucralfate (CARAFATE) 1 GM/10ML suspension 1 g: ORAL | @ 11:00:00 | NDC 66689079001

## 2013-08-07 MED ADMIN — pantoprazole (PROTONIX) injection 40 mg: INTRAVENOUS | @ 13:00:00 | NDC 00008400101

## 2013-08-07 MED ADMIN — pantoprazole (PROTONIX) injection 40 mg: INTRAVENOUS | @ 01:00:00 | NDC 00008400101

## 2013-08-07 MED ADMIN — montelukast (SINGULAIR) tablet 10 mg: ORAL | @ 13:00:00 | NDC 68084062011

## 2013-08-07 MED ADMIN — enoxaparin (LOVENOX) injection 40 mg: SUBCUTANEOUS | @ 01:00:00 | NDC 00075062430

## 2013-08-07 MED ADMIN — ondansetron (ZOFRAN) injection 4 mg: INTRAVENOUS | @ 08:00:00 | NDC 23155037831

## 2013-08-07 MED ADMIN — sucralfate (CARAFATE) 1 GM/10ML suspension 1 g: ORAL | @ 15:00:00 | NDC 68094017159

## 2013-08-07 MED ADMIN — citalopram (CELEXA) tablet 40 mg: ORAL | @ 13:00:00 | NDC 68084074411

## 2013-08-07 MED ADMIN — promethazine (PHENERGAN) injection 25 mg: INTRAVENOUS | @ 19:00:00 | NDC 00641092821

## 2013-08-07 MED ADMIN — cetirizine (ZYRTEC) tablet 10 mg: ORAL | @ 13:00:00 | NDC 51079059701

## 2013-08-07 MED FILL — CETIRIZINE HCL 10 MG PO TABS: 10 MG | ORAL | Qty: 1

## 2013-08-07 MED FILL — CLINIMIX E/DEXTROSE (5/20) 5 % IV SOLN: 5 % | INTRAVENOUS | Qty: 2000

## 2013-08-07 MED FILL — NORMAL SALINE FLUSH 0.9 % IV SOLN: 0.9 % | INTRAVENOUS | Qty: 10

## 2013-08-07 MED FILL — SUCRALFATE 1 GM/10ML PO SUSP: 1 GM/0ML | ORAL | Qty: 10

## 2013-08-07 MED FILL — SINGULAIR 10 MG PO TABS: 10 MG | ORAL | Qty: 1

## 2013-08-07 MED FILL — PROTONIX 40 MG IV SOLR: 40 MG | INTRAVENOUS | Qty: 40

## 2013-08-07 MED FILL — HYDROMORPHONE HCL PF 1 MG/ML IJ SOLN: 1 MG/ML | INTRAMUSCULAR | Qty: 1

## 2013-08-07 MED FILL — LOVENOX 30 MG/0.3ML SC SOLN: 30 MG/0.3ML | SUBCUTANEOUS | Qty: 0.6

## 2013-08-07 MED FILL — NORMAL SALINE FLUSH 0.9 % IV SOLN: 0.9 % | INTRAVENOUS | Qty: 20

## 2013-08-07 MED FILL — PROMETHAZINE HCL 25 MG/ML IJ SOLN: 25 MG/ML | INTRAMUSCULAR | Qty: 1

## 2013-08-07 MED FILL — ONDANSETRON HCL 4 MG/2ML IJ SOLN: 4 MG/2ML | INTRAMUSCULAR | Qty: 2

## 2013-08-07 MED FILL — NORMAL SALINE FLUSH 0.9 % IV SOLN: 0.9 % | INTRAVENOUS | Qty: 30

## 2013-08-07 MED FILL — CITALOPRAM HYDROBROMIDE 20 MG PO TABS: 20 MG | ORAL | Qty: 2

## 2013-08-07 MED FILL — HUMALOG KWIKPEN 100 UNIT/ML SC SOPN: 100 UNIT/ML | SUBCUTANEOUS | Qty: 3

## 2013-08-07 MED FILL — ALPRAZOLAM 0.5 MG PO TABS: 0.5 MG | ORAL | Qty: 1

## 2013-08-07 MED FILL — GABAPENTIN 300 MG PO CAPS: 300 MG | ORAL | Qty: 1

## 2013-08-07 NOTE — Progress Notes (Signed)
FSBS 180. Covered per SSI. PRN phenergan given for nausea. See eMAR. Dressing change to L abdomen. Pt tolerated well. Call light in reach. Will cont to monitor.

## 2013-08-07 NOTE — Progress Notes (Signed)
Pt resting in bed, alert to voice, denies pain or needs. Appears in no distress. Will continue to monitor.     Electronically signed by Girtha RmLaura N Almer Littleton, RN, BSN on 08/07/2013 at 2:39 PM

## 2013-08-07 NOTE — Progress Notes (Signed)
Prn dilaudid administered per pt request for pain in abd and Zofran for nausea, will continue to monitor.

## 2013-08-07 NOTE — Progress Notes (Signed)
Pt resting in bed, reports pain in abd 10/10, PRN dilaudid given see eMAR. PRN phenergan given for nausea, see eMAR. All other medications administered as ordered. Dressing change completed to abd. JP site healing, dry dressing placed over top. Open wound to left abd packed with iodoform and dry dressing placed overtop, granulation noted in wound, minimal bloody drainage. VSS. Shift assessment complete. Call light in reach. Will continue to monitor.     Electronically signed by Girtha RmLaura N Velencia Lenart, RN, BSN on 08/07/2013 at 9:21 AM

## 2013-08-07 NOTE — Progress Notes (Signed)
Rachael Mays is a 57 y.o. female patient.    Current Facility-Administered Medications   Medication Dose Route Frequency Provider Last Rate Last Dose   ??? pantoprazole (PROTONIX) injection 40 mg  40 mg Intravenous BID Durene Romans O'Toole Jr., DO   40 mg at 08/06/13 2046   ??? sucralfate (CARAFATE) 1 GM/10ML suspension 1 g  1 g Oral Q6H [facility] Terrance M O'Toole Jr., DO   1 g at 08/07/13 9604   ??? insulin lispro (HUMALOG) injection pen 0-6 Units  0-6 Units Subcutaneous Q6H Lum Keas, MD       ??? albuterol (PROVENTIL HFA;VENTOLIN HFA) 108 (90 BASE) MCG/ACT inhaler 2 puff  2 puff Inhalation Q6H PRN Lum Keas, MD       ??? ALPRAZolam Prudy Feeler) tablet 0.5 mg  0.5 mg Oral BID PRN Lum Keas, MD   0.5 mg at 08/07/13 0138   ??? cetirizine (ZYRTEC) tablet 10 mg  10 mg Oral Daily Lum Keas, MD   10 mg at 08/06/13 5409   ??? cloNIDine (CATAPRES) 0.1 MG/24HR 1 patch  1 patch Transdermal Weekly Lum Keas, MD   1 patch at 08/06/13 [phone]   ??? citalopram (CELEXA) tablet 40 mg  40 mg Oral Daily Lum Keas, MD   40 mg at 08/06/13 1478   ??? gabapentin (NEURONTIN) capsule 300 mg  300 mg Oral Nightly Lum Keas, MD   300 mg at 08/06/13 2046   ??? montelukast (SINGULAIR) tablet 10 mg  10 mg Oral Daily Lum Keas, MD   10 mg at 08/06/13 0857   ??? [START ON 08/08/2013] scopolamine (TRANSDERM-SCOP) 1.5 MG 1 patch  1 patch Transdermal Q72H Lum Keas, MD       ??? sodium chloride flush 0.9 % injection 10 mL  10 mL Intravenous Q12H [facility] Lum Keas, MD   10 mL at 08/06/13 2046   ??? sodium chloride flush 0.9 % injection 10 mL  10 mL Intravenous PRN Lum Keas, MD       ??? acetaminophen (TYLENOL) tablet 650 mg  650 mg Oral Q4H PRN Lum Keas, MD       ??? ondansetron [facility]) injection 4 mg  4 mg Intravenous Q6H PRN Lum Keas, MD   4 mg at 08/07/13 0346   ??? promethazine (PHENERGAN) injection 25 mg  25 mg  Intravenous Q6H PRN Lum Keas, MD   25 mg at 08/06/13 2345   ??? HYDROmorphone HCl PF (DILAUDID) injection SOLN 1 mg  1 mg Intravenous Q4H PRN Lum Keas, MD   1 mg at 08/07/13 0346   ??? enoxaparin (LOVENOX) injection 40 mg  40 mg Subcutaneous Nightly Lum Keas, MD   40 mg at 08/06/13 2049     Allergies   Allergen Reactions   ??? Talwin [Pentazocine] Other (See Comments)     dizzy     Active Problems:    * No active hospital problems. *    Blood pressure 101/52, pulse 71, temperature 98.4 ??F (36.9 ??C), temperature source Oral, resp. rate 16, height 5\' 6"  (1.676 m), weight 215 lb (97.523 kg), SpO2 95 %.    Subjective:  Symptoms:  (Pt cont to have abd discomfort and nausea.  Some dry heaves last night but nothing today.  ).      Objective:  General Appearance:  In  no acute distress.    Vital signs: (most recent): Blood pressure 101/52, pulse 71, temperature 98.4 ??F (36.9 ??C), temperature source Oral, resp. rate 16, height 5\' 6"  (1.676 m), weight 215 lb (97.523 kg), SpO2 95 %.  Vital signs are normal.  No fever.    Output: Producing urine.    HEENT: Normal HEENT exam.    Lungs:  Normal effort.  Breath sounds clear to auscultation.    Heart: Normal rate.    Abdomen: Abdomen is soft and non-distended.  Hypoactive bowel sounds.   There is no abdominal tenderness tenderness.       Assessment:  (Clinically about the same.  Tolerating hyperal.  GI eval and poss EGD).       Guss Bunde, MD  08/07/2013

## 2013-08-07 NOTE — Progress Notes (Signed)
Pt resting in bed, eyes closed and resp easy, appears in no pain or distress. Will continue to monitor.     Electronically signed by Girtha RmLaura N Meaghann Choo, RN, BSN on 08/07/2013 at 10:12 AM

## 2013-08-07 NOTE — Progress Notes (Signed)
Report and bedside handoff complete with Marchelle FolksAmanda, RN. Pt resting in bed, eyes closed and resp easy, appears in no pain or distress. Will continue to monitor.     Electronically signed by Girtha RmLaura N Aidden Markovic, RN, BSN on 08/07/2013 at 7:27 AM

## 2013-08-07 NOTE — Progress Notes (Signed)
Prn xanax administered per pt request, will monitor.

## 2013-08-07 NOTE — Progress Notes (Signed)
Pt resting in bed, eyes closed and resp easy. Call light in reach. Will continue to monitor.     Electronically signed by Girtha RmLaura N Antuan Limes, RN, BSN on 08/07/2013 at 1:16 PM

## 2013-08-07 NOTE — Progress Notes (Signed)
PRN dilaudid given for pain. See eMAR.

## 2013-08-07 NOTE — Consults (Signed)
NUTRITION THERAPY ASSESSMENT     Admission date:08/05/2013      Timepoint: Initial, consult for home TPN    JAPJI KOK is a 57 y.o. female       REASON FOR ADMISSION / MEDICAL HISTORY:  Pt admitted for emesis.    Hx of lap band (04/2008), band removed (06/2013), metal mesh stent stomach (07/2013), HTN, back pain    SUBJECTIVE & INTERVAL HISTORY:  9/1 pt NPO and on PN at 190 cc/hr x 10 hr with 1 hr x 50cc/hr taper up and down. Per med record, pt s/p lap band removal after band erosion, pt cont w/ N&V, on home PN. Records reveal 10 lb wt loss x 2 months (5% BW).     Previous admission:  07/2013 WANDY BOSSLER is a 57 y.o. female admitted d/t had esophageal stent placed yesterday presents with uncontrolled pain and nausea and vomiting, unable to swallow; She was recently admitted 7/30 for gastric band erosion requiring removal. She was started on PN during that admission. Has been on nightly TPN at home. RD consult for PN. Currently NPO on PN @ 40 ml/hr.   8/25: Po intake is improved per pt. Reports had bites of egg & hash browns, a piece of toast, & choc milk for bkf. Cyclic PN will con't until pt able to consume adequate nutrition with po alone.   8/20: Pt is tolerating small amounts of clear liquids. Cyclic PN tolerated well @ goal. Cyclic PN: 50 ml taper plus 782 ml/hr x 10 hr    OBJECTIVE DATA  Patient Active Problem List    Diagnosis Date Noted   ??? HTN (hypertension) 07/24/2013   ??? Hyperglycemia 07/24/2013   ??? Wound infection (HCC) 07/22/2013   ??? Complication of gastric banding 07/05/2013   ??? 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 Vertigo 04/11/2013   ??? Dizziness 03/29/2013   ??? 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 Morbid obesity 02/21/2009   ??? Back Pain 01/23/2009   ??? Deep Vein Thrombosis 01/23/2009   ??? Depression 01/23/2009   ??? High Cholesterol 01/23/2009   ??? Hypertension 01/23/2009   ??? Pulmonary Embolism 01/23/2009   ??? Nausea & vomiting 01/23/2009   ??? Abdominal  pain, other  specified site 01/23/2009     Past Medical History   Diagnosis Date   ??? Anxiety    ??? Asthma    ??? Chronic back pain    ??? Deep vein thrombosis    ??? Depression    ??? GERD (gastroesophageal reflux disease)      NO LONGER SINCE LAP   ??? Pulmonary embolism (HCC)    ??? Obesity      hx of; had lap band   ??? Hyperlipidemia      hx; resolved with lap band   ??? GERD (gastroesophageal reflux disease) 01/23/2009   ??? Hypertension        Labs  Recent Labs      08/05/13   2057  08/06/13   0500  08/07/13   0655   NA   --   135*  135*   K   --   3.6  3.7   CL   --   101  103   CO2  21  25  26    BUN   --   13  15   CREATININE  0.8  0.6  0.5*   GLUCOSE   --   99  120*     Lab Results   Component Value Date    PHOS 3.3 08/07/2013       Recent Labs      08/05/13   2045   AST  51*   ALT  71*   BILIDIR  0.1   BILITOT  0.4   ALKPHOS  150*     Lab Results   Component Value Date    LABALBU 3.2 08/05/2013      Lab Results   Component Value Date    TRIG 102 07/24/2013    HDL 60 06/30/2013    HDL 52 03/31/2011    LDLCALC 138 06/30/2013    LABVLDL 19 06/30/2013     Recent Labs      08/05/13   2045   LIPASE  32.0     Lab Results   Component Value Date    LABA1C 5.2 03/26/2010       Medications  Continuous Medications:    ??? PN-Adult Premix 5/20 - Standard Electrolytes - [place]       Scheduled Medications:   ??? pantoprazole  40 mg Intravenous BID   ??? sucralfate  1 g Oral Q6H [facility]   ??? insulin lispro  0-6 Units Subcutaneous Q6H   ??? cetirizine  10 mg Oral Daily   ??? cloNIDine  1 patch Transdermal Weekly   ??? citalopram  40 mg Oral Daily   ??? gabapentin  300 mg Oral Nightly   ??? montelukast  10 mg Oral Daily   ??? [START ON 08/08/2013] scopolamine  1 patch Transdermal Q72H   ??? sodium chloride flush  10 mL Intravenous Q12H [facility]   ??? enoxaparin  40 mg Subcutaneous Nightly       Anthropometric Measures   Height: 5\' 6"  (167.6 cm)   Current Weight: Weight: 215 lb (97.523 kg)   Admission weight: 215 lb (97.523 kg)  Weight Method: Stated  If applicable: n/a  Usual Body Weight:  lb        Ideal Body Weight:  lb      % Ideal Body Weight:  %  Adjusted Body Weight:  lb    Body mass index is 34.72 kg/(m^2).   Classified as:  Less than 18.5 Underweight  18.5-24.9 Normal Weight  25-29.9 Overweight  30-34.9 Obese Class I  35-39.9 Obese Class II  Greater than or equal to 40 Obese Class III    Patient Vitals for the past 96 hrs (Last 3 readings):   Weight   08/05/13 2017 215 lb (97.523 kg)     Wt Readings from Last 50 Encounters:   08/05/13 215 lb (97.523 kg)   08/01/13 240 lb 4.8 oz (109 kg)   07/20/13 205 lb (92.987 kg)   07/09/13 236 lb (107.049 kg)   06/29/13 231 lb (104.781 kg)   06/12/13 225 lb (102.059 kg)   06/07/13 228 lb 8 oz (103.647 kg)   05/30/13 229 lb (103.874 kg)   05/22/13 230 lb 8 oz (104.554 kg)   05/19/13 231 lb 11.3 oz (105.1 kg)   05/18/13 225 lb (102.059 kg)   05/18/13 232 lb (105.235 kg)   04/27/13 229 lb 3.2 oz (103.964 kg)   04/17/13 230 lb (104.327 kg)   04/03/13 231 lb (104.781 kg)   03/29/13 228 lb 8 oz (103.647 kg)   03/10/13 227 lb 8 oz (103.193 kg)   02/23/13 227 lb 9.6 oz (103.239 kg)   12/28/12 217 lb (98.431 kg)   12/15/12  216 lb (97.977 kg)   10/20/12 213 lb (96.616 kg)   06/24/12 212 lb (96.163 kg)   06/02/12 210 lb (95.255 kg)   05/31/12 208 lb 3.2 oz (94.439 kg)   05/19/12 209 lb (94.802 kg)   04/14/12 211 lb (95.709 kg)   03/17/12 213 lb (96.616 kg)   03/16/12 214 lb (97.07 kg)   12/23/11 223 lb (101.152 kg)   11/14/11 220 lb (99.791 kg)   11/07/11 220 lb (99.791 kg)   11/03/11 226 lb (102.513 kg)   05/15/11 233 lb 12.8 oz (106.051 kg)   04/08/10 240 lb (108.863 kg)   04/01/10 240 lb (108.863 kg)   03/26/10 241 lb 2 oz (109.374 kg)   02/06/10 248 lb 3 oz (112.577 kg)   11/07/09 243 lb (110.224 kg)   10/23/09 235 lb (106.595 kg)   08/07/09 246 lb (111.585 kg)   05/02/09 242 lb 5 oz (109.912 kg)   02/21/09 245 lb (111.131 kg)   01/23/09 244 lb 4 oz (110.791 kg)   11/19/08 243 lb 9 oz (110.479 kg)       Comparative Standards    Weight Used: 69 kg ABW  Estimated  Calorie Needs: 1700-2000 (based on 25- 30kcal/kg)  Estimated Protein Needs: 82-103 (based on 1.2- 1.5gm/kg)  Estimated Fluid Needs: 1700-2000 (based on 18mL/kcal)    Nutrition-focused Physical Findings           Emesis:   on admission  Stool:       none reported  Skin/Edema:  Skin Integrity (WDL): Exceptions to WDL   Location: abd Skin Integrity: Other (Comment) (old JP site to abd and open wound to abd)     Chewing / Swallowing:   no issues reported  Mental Status / Barriers:  no issues reported    Food/Nutrition-Related History  Pre-Admission / Home Diet:  Pre-Admission/Home Diet: General   Home Supplements / Herbals:    none noted  Food Restrictions / Cultural Requests:    none noted  Allergies as of 08/05/2013 - Review Complete 08/05/2013   Allergen Reaction Noted   ??? Talwin [pentazocine] Other (See Comments) 03/29/2013       PO Diet Orders  Current diet order: Diet NPO Effective Now, Except for  PN-Adult Premix 5/20 - Standard Electrolytes - Central Line  Feeding Assistance:        Feeding: Able to feed self    Room Service: selective  Nursing Recorded PO Intake:     Patient / Family Reported PO Intake: n/a  Observed PO Intake: n/a  Pain and PO intake: n/a.    Tube Feeding Order / Parenteral Order  PN goal 190 cc/hr x 10 hr w/ 1 hr x 50 cc/hr taper up and down providing 1753 kcal, 99.6 g protein      Intake/Output Summary (Last 24 hours) at 08/07/13 1504  Last data filed at 08/06/13 1842   Gross per 24 hour   Intake   1250 ml   Output      0 ml   Net   1250 ml       Intake vs. Needs: meets needs w/ PN    NUTRITION DIAGNOSIS and GOAL  Nutrition Deficit Risk.   ?? Problem: adequate nutrient intake  ?? Etiology/related to: n/a  ?? Symptoms/Signs/as evidenced by: n/a    Goal: pt will tolerate adv of diet and consume greater than 50% of meals.   Initiated: 9/1     Progress: n/a  Resolved: n/a    EDUCATION  n/a      MALNUTRITION  Per AND/ASPEN guidelines, patient does not meet criteria for malnutrition, will monitor  for additional RD, RN and MD documentation re: weight status, nutritional intake, fluid accumulation, possible loss of body fat or muscle mass, or possible reduced grip strength.    INTERVENTION HISTORY  9/1 initial assessment    NUTRITION PRESCRIPTION / RECOMMENDATIONS / MONITORING / EVALUATION   1. Monitor for adv of diet   2. Will initiate oral nutr suppl if diet adv and po less than 50%  3. Continue PN at goal rate 190 cc/hr x 10 hr with 1 hr x 50 cc/hr taper up and down  4. Monitor treatment plan, weight, electrolytes, & po intake    Pt is at high nutritional risk. Will follow up in 2-4 days to monitor nutrition.  Consult dietitian if intervention is needed before that time.    Borghild Thaker MEd, RD, LD

## 2013-08-07 NOTE — Progress Notes (Signed)
Assessment complete. See doc flowsheets. Pt complains of 10/10 abdominal pain and nausea. PRN dilaudid and phenergan given. See eMAR. FSBS 93. Call light in reach. Husband at bedside. Will cont to monitor.

## 2013-08-07 NOTE — Progress Notes (Signed)
TPN hung and infusing without complications. See eMAR. Visitors at bedside. Call light in reach. Will cont to monitor.

## 2013-08-07 NOTE — Plan of Care (Signed)
Problem: Falls - Risk of  Goal: Absence of falls  Outcome: Ongoing  Pt remains free from falls. Able to ambulate independently. Fall precautions in place. Will continue to monitor.     Problem: Risk for Impaired Skin Integrity  Goal: Tissue integrity - skin and mucous membranes  Structural intactness and normal physiological function of skin and  mucous membranes.   JP site healing. Wound packed to left abd. Dressing changed this am. No further evidence of skin breakdown. Will continue to monitor.     Problem: Nutrition Deficit  Intervention: Nutritional intake assessment  Pt remains NPO with nocturnal TPN feedings. Blood glucose wnl. Will continue to monitor.

## 2013-08-07 NOTE — Progress Notes (Signed)
TPN stopped per eMAR.  Blood drawn and sent to lab, CLC2000 changed, flushed well with good blood return, no complications.

## 2013-08-07 NOTE — Consults (Signed)
Gastroenterology Consult Note      Patient: Rachael Mays  DOB: 01-28-1956  Acct#:      Date:  08/07/2013    Subjective:       History of Present Illness  Patient is a 57 y.o. Caucasian female admitted with EMESIS [phone] who is seen in consult for the same. She is s/p lap band removal after band erosion into proximal stomach/distal esophagus noted. Removal was complicated by a persistent small leak from proximal gastric cardia per UGI. She was initially managed with NPO, home TPN, but repeat UGI showed persisting leak. 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 She developed chest pain in the PACU and CXR demonstrated that the stent migrated into stomach. The EGD was repeated, the fully-covered stent was removed, and a new "partially-covered" stent was placed across distal esophagus and gastroesophageal junction. Despite this she has continued to have pc N/V and pain and has been unable to manage at home. She denies any new symptoms.  She is admitted for symptom control. She denies any fever, hematemesis or melena.         Past Medical History   Diagnosis Date   ??? Anxiety    ??? Asthma    ??? Chronic back pain    ??? Deep vein thrombosis    ??? Depression    ??? GERD (gastroesophageal reflux disease)      NO LONGER SINCE LAP   ??? Pulmonary embolism (HCC)    ??? Obesity      hx of; had lap band   ??? Hyperlipidemia      hx; resolved with lap band   ??? GERD (gastroesophageal reflux disease) 01/23/2009   ??? Hypertension       Past Surgical History   Procedure Laterality Date   ??? Colonoscopy     ??? Upper gastrointestinal endoscopy     ??? Cesarean section     ??? Cholecystectomy     ??? Hysterectomy     ??? Shoulder surgery     ??? Lap band  05/01/08     Dr. Suzi Roots   ??? Back surgery       neck  plates   ??? Colonoscopy  04/08/10   ??? Spine surgery     ??? Other surgical history       [place] Filter: curently in place   ??? Upper  gastrointestinal endoscopy  05/19/13   ??? Other surgical history  07/05/13     lap band removal   ??? Upper gastrointestinal endoscopy  07/21/13     ESOPHAGEAL STENT PLACEMENT   ??? Upper gastrointestinal endoscopy  07/21/13     WITH EXCHANGE OF ESOPHAGEAL STENT      Past Endoscopic History: as above    Admission Meds  No current facility-administered medications on file prior to encounter.     Current Outpatient Prescriptions on File Prior to Encounter   Medication Sig Dispense Refill   ??? oxyCODONE-acetaminophen (ROXICET) 5-325 MG/5ML solution 5-10 mL Q4 hr prn pain  300 mL  0   ??? scopolamine (TRANSDERM-SCOP) 1.5 MG Place 1 patch onto the skin every 72 hours.  3 patch  12   ??? promethazine (PHENERGAN) 25 MG tablet Take 1 tablet by mouth every 6 hours as needed for Nausea.  40 tablet  0   ??? cloNIDine (CATAPRES) 0.1 MG/24HR Place 1 patch onto the skin once a week.  4 patch  3   ??? magnesium hydroxide (MILK OF MAGNESIA) 400 MG/5ML suspension Take 30 mLs by mouth daily as needed for Constipation.       ??? sucralfate (CARAFATE) 1 GM tablet Take 1 tablet by mouth every 6 hours.  120 tablet  3   ??? omeprazole (PRILOSEC) 40 MG capsule Take 1 capsule by mouth daily.  30 capsule  3   ??? NONFORMULARY TPN overnight per picc line       ??? enoxaparin (LOVENOX) 30 MG/0.3ML injection Inject 0.4 mLs into the skin daily.  40 mL  0   ??? ondansetron (ZOFRAN) 4 MG tablet Take 1 tablet by mouth every 8 hours as needed for Nausea or Vomiting.  30 tablet  0   ??? montelukast (SINGULAIR) 10 MG tablet Take 1 tablet by mouth daily.  30 tablet  3   ??? fluocinonide (LIDEX) 0.05 % cream Apply topically 2 times daily.  1 Tube  1   ??? Wound Dressings (INTERDRY AG TEXTILE 10"X12') MISC Apply 1 each topically daily. Pt would like a call with price before filled  1 each  1   ??? cetirizine (ZYRTEC ALLERGY) 10 MG tablet Take 10 mg by mouth daily.       ??? ALPRAZolam (XANAX) 0.5 MG tablet Take 1 tablet by mouth 2 times daily as needed for Sleep or Anxiety.  60 tablet  0      ??? escitalopram (LEXAPRO) 20 MG tablet Take 20 mg by mouth nightly.       ??? albuterol (PROVENTIL;VENTOLIN) 90 MCG/ACT inhaler Inhale 2 puffs into the lungs every 6 hours as needed.           Patient denies ASA, NSAID use.    Allergies  Allergies   Allergen Reactions   ??? Talwin [Pentazocine] Other (See Comments)     dizzy      Social   History   Substance Use Topics   ??? Smoking status: Former Smoker -- 0.50 packs/day for 35 years     Types: Cigarettes     Quit date: 08/07/2012   ??? Smokeless tobacco: Never Used   ??? Alcohol Use: No        Family History   Problem Relation Age of Onset   ??? Arthritis Mother    ??? Cancer Mother    ??? Depression Mother    ??? High Blood Pressure Mother    ??? Heart Disease Maternal Grandmother    ??? Early Death Paternal Grandmother    ??? Heart Disease Paternal Grandfather    ??? Diabetes Other    ??? High Blood Pressure Other    ??? Obesity Other    ??? Heart Disease Mother 53     MI    ??? Ovarian Cancer Mother 46   ??? Cancer Father 66     colon   ??? Cancer Mother       No family history of colon cancer, Crohn's disease, or ulcerative colitis.    Review of Systems  Pertinent items are noted in HPI.       Physical Exam  Blood pressure 103/66, pulse 65, temperature 98.5 ??F (36.9 ??C), temperature source Temporal, resp. rate 12, height 5\' 6"  (1.676 m), weight 215 lb (97.523 kg), SpO2 95 %.    General appearance: alert, cooperative, no distress, appears stated age  Eyes: Anicteric  Head: Normocephalic, without obvious abnormality  Lungs: clear to auscultation bilaterally, Normal Effort  Heart: regular rate and rhythm, normal S1 and S2, no murmurs  or rubs  Abdomen: soft, mild tender epi. Bowel sounds normal. No masses,  no organomegaly.   Extremities: atraumatic, no cyanosis or edema  Skin: warm and dry, no jaundice  Neuro: Grossly intact, A&OX3      Data Review:    Recent Labs      08/05/13   2045  08/07/13   0655   WBC  8.5  9.0   HGB  12.4  10.7*   HCT  38.6  32.8*   MCV  85.3  84.3   PLT  228  187     Recent  Labs      08/05/13   2057  08/06/13   0500  08/07/13   0655   NA   --   135*  135*   K   --   3.6  3.7   CL   --   101  103   CO2  21  25  26    PHOS   --    --   3.3   BUN   --   13  15   CREATININE  0.8  0.6  0.5*     Recent Labs      08/05/13   2045   AST  51*   ALT  71*   BILIDIR  0.1   BILITOT  0.4   ALKPHOS  150*     Recent Labs      08/05/13   2045   LIPASE  32.0     No results found for this basename: PROTIME, INR,  in the last 72 hours  No results found for this basename: PTT,  in the last 72 hours  No results found for this basename: OCCULTBLD,  in the last 72 hours    Imaging Studies:                           CXR:   Impression:   1. Stable appearance and position of the esophagogastric   expandable mesh stent.   2. Good position of the left-sided PICC line.   3. Minimal atelectasis at the left lung base. No evidence of   failure , segmental infiltrate or free fluid.   4. No evidence of free air beneath the hemidiaphragms.   5. Indwelling inferior vena cava filter                    Assessment:     Active Problems:    * No active hospital problems. *    Persistent abd pain and N/V despite exchanging stent. No new symptoms, just not improving. ? All symptoms due to stent. Last UGI was 2 weeks ago.     Recommendations:   Repeat UGI to assess placement, r/o leak.    Brigid Re MD

## 2013-08-07 NOTE — Progress Notes (Signed)
Report and bedside handoff complete with Alissa, RN.     Electronically signed by Tija Biss N Quenisha Lovins, RN, BSN on 08/07/2013 at 3:27 PM

## 2013-08-07 NOTE — Progress Notes (Signed)
Pt resting in bed, denies pain or needs. VSS. Medications given as ordered. Will continue to monitor.     Electronically signed by Girtha RmLaura N Antwuan Eckley, RN, BSN on 08/07/2013 at 11:23 AM

## 2013-08-08 LAB — POCT GLUCOSE
POC Glucose: 180 mg/dl — ABNORMAL HIGH (ref 70–99)
POC Glucose: 130 mg/dl — ABNORMAL HIGH (ref 70–99)
POC Glucose: 85 mg/dl (ref 70–99)
POC Glucose: 104 mg/dl — ABNORMAL HIGH (ref 70–99)
POC Glucose: 78 mg/dl (ref 70–99)

## 2013-08-08 LAB — CBC WITH AUTO DIFFERENTIAL
Basophils %: 0.9 %
Basophils Absolute: 0.1 10*3/uL (ref 0.0–0.2)
Eosinophils %: 7.2 %
Eosinophils Absolute: 0.6 10*3/uL (ref 0.0–0.6)
Hematocrit: 34.4 % — ABNORMAL LOW (ref 36.0–48.0)
Hemoglobin: 11.2 g/dL — ABNORMAL LOW (ref 12.0–16.0)
Lymphocytes %: 25.4 %
Lymphocytes Absolute: 2.2 10*3/uL (ref 1.0–5.1)
MCH: 27.6 pg (ref 26.0–34.0)
MCHC: 32.4 g/dL (ref 31.0–36.0)
MCV: 85 fL (ref 80.0–100.0)
MPV: 8.3 fL (ref 5.0–10.5)
Monocytes %: 11.1 %
Monocytes Absolute: 1 10*3/uL (ref 0.0–1.3)
Neutrophils %: 55.4 %
Neutrophils Absolute: 4.8 10*3/uL (ref 1.7–7.7)
Platelets: 201 10*3/uL (ref 135–450)
RBC: 4.05 M/uL (ref 4.00–5.20)
RDW: 14.1 % (ref 12.4–15.4)
WBC: 8.6 10*3/uL (ref 4.0–11.0)

## 2013-08-08 LAB — BASIC METABOLIC PANEL
BUN: 20 mg/dL (ref 7–20)
CO2: 24 mmol/L (ref 21–32)
Calcium: 7.7 mg/dL — ABNORMAL LOW (ref 8.3–10.6)
Chloride: 106 mmol/L (ref 99–110)
Creatinine: 0.5 mg/dL — ABNORMAL LOW (ref 0.6–1.1)
GFR African American: >60 (ref >60)
GFR Non-African American: >60 (ref >60)
Glucose: 90 mg/dL (ref 70–99)
Potassium: 3.5 mmol/L (ref 3.5–5.1)
Sodium: 138 mmol/L (ref 136–145)

## 2013-08-08 MED ADMIN — sucralfate (CARAFATE) 1 GM/10ML suspension 1 g: ORAL | @ 23:00:00 | NDC 68094017159

## 2013-08-08 MED ADMIN — sodium chloride flush 0.9 % injection 10 mL: INTRAVENOUS | @ 15:00:00

## 2013-08-08 MED ADMIN — sodium chloride flush 0.9 % injection 10 mL: INTRAVENOUS | @ 13:00:00

## 2013-08-08 MED ADMIN — montelukast (SINGULAIR) tablet 10 mg: ORAL | @ 15:00:00 | NDC 68084062011

## 2013-08-08 MED ADMIN — PN-Adult Premix 5/20 - Standard Electrolytes - Central Line: INTRAVENOUS | @ 23:00:00 | NDC 00338112504

## 2013-08-08 MED ADMIN — promethazine (PHENERGAN) injection 25 mg: INTRAVENOUS | @ 07:00:00 | NDC 00641092821

## 2013-08-08 MED ADMIN — sucralfate (CARAFATE) 1 GM/10ML suspension 1 g: ORAL | @ 15:00:00 | NDC 00121074710

## 2013-08-08 MED ADMIN — insulin lispro (HUMALOG) injection pen 0-6 Units: SUBCUTANEOUS | @ 01:00:00 | NDC 00002879901

## 2013-08-08 MED ADMIN — sucralfate (CARAFATE) 1 GM/10ML suspension 1 g: ORAL | @ 09:00:00 | NDC 68094017159

## 2013-08-08 MED ADMIN — gabapentin (NEURONTIN) capsule 300 mg: ORAL | @ 01:00:00 | NDC 68084056311

## 2013-08-08 MED ADMIN — enoxaparin (LOVENOX) injection 40 mg: SUBCUTANEOUS | @ 01:00:00 | NDC 00075801301

## 2013-08-08 MED ADMIN — pantoprazole (PROTONIX) injection 40 mg: INTRAVENOUS | @ 01:00:00 | NDC 00008400101

## 2013-08-08 MED ADMIN — ondansetron (ZOFRAN) injection 4 mg: INTRAVENOUS | @ 13:00:00 | NDC 23155037831

## 2013-08-08 MED ADMIN — sodium chloride flush 0.9 % injection 10 mL: INTRAVENOUS | @ 17:00:00

## 2013-08-08 MED ADMIN — promethazine (PHENERGAN) injection 25 mg: INTRAVENOUS | @ 19:00:00 | NDC 00143986922

## 2013-08-08 MED ADMIN — ondansetron (ZOFRAN) injection 4 mg: INTRAVENOUS | @ 23:00:00 | NDC 23155037831

## 2013-08-08 MED ADMIN — iohexol (OMNIPAQUE 350) solution 50 mL: ORAL | @ 13:00:00 | NDC 00407141489

## 2013-08-08 MED ADMIN — sodium chloride flush 0.9 % injection 10 mL: INTRAVENOUS | @ 14:00:00

## 2013-08-08 MED ADMIN — sodium chloride flush 0.9 % injection 10 mL: INTRAVENOUS | @ 01:00:00

## 2013-08-08 MED ADMIN — promethazine (PHENERGAN) injection 25 mg: INTRAVENOUS | @ 01:00:00 | NDC 00641092821

## 2013-08-08 MED ADMIN — sucralfate (CARAFATE) 1 GM/10ML suspension 1 g: ORAL | @ 04:00:00 | NDC 68094017159

## 2013-08-08 MED ADMIN — scopolamine (TRANSDERM-SCOP) 1.5 MG 1 patch: TRANSDERMAL | @ 13:00:00 | NDC 10019055301

## 2013-08-08 MED ADMIN — pantoprazole (PROTONIX) injection 40 mg: INTRAVENOUS | @ 15:00:00 | NDC 00008400101

## 2013-08-08 MED ADMIN — citalopram (CELEXA) tablet 40 mg: ORAL | @ 15:00:00 | NDC 68084074411

## 2013-08-08 MED ADMIN — cetirizine (ZYRTEC) tablet 10 mg: ORAL | @ 15:00:00 | NDC 51079059701

## 2013-08-08 MED FILL — SINGULAIR 10 MG PO TABS: 10 MG | ORAL | Qty: 1

## 2013-08-08 MED FILL — ONDANSETRON HCL 4 MG/2ML IJ SOLN: 4 MG/2ML | INTRAMUSCULAR | Qty: 2

## 2013-08-08 MED FILL — NORMAL SALINE FLUSH 0.9 % IV SOLN: 0.9 % | INTRAVENOUS | Qty: 10

## 2013-08-08 MED FILL — GABAPENTIN 300 MG PO CAPS: 300 MG | ORAL | Qty: 1

## 2013-08-08 MED FILL — PROTONIX 40 MG IV SOLR: 40 MG | INTRAVENOUS | Qty: 40

## 2013-08-08 MED FILL — LOVENOX 30 MG/0.3ML SC SOLN: 30 MG/0.3ML | SUBCUTANEOUS | Qty: 0.6

## 2013-08-08 MED FILL — HYDROMORPHONE HCL PF 1 MG/ML IJ SOLN: 1 MG/ML | INTRAMUSCULAR | Qty: 1

## 2013-08-08 MED FILL — SUCRALFATE 1 GM/10ML PO SUSP: 1 GM/0ML | ORAL | Qty: 10

## 2013-08-08 MED FILL — TRANSDERM-SCOP (1.5 MG) 1 MG/3DAYS TD PT72: 1 MG/3DAYS | TRANSDERMAL | Qty: 1

## 2013-08-08 MED FILL — CETIRIZINE HCL 10 MG PO TABS: 10 MG | ORAL | Qty: 1

## 2013-08-08 MED FILL — CLINIMIX E/DEXTROSE (5/20) 5 % IV SOLN: 5 % | INTRAVENOUS | Qty: 2000

## 2013-08-08 MED FILL — PROMETHAZINE HCL 25 MG/ML IJ SOLN: 25 MG/ML | INTRAMUSCULAR | Qty: 1

## 2013-08-08 MED FILL — NORMAL SALINE FLUSH 0.9 % IV SOLN: 0.9 % | INTRAVENOUS | Qty: 20

## 2013-08-08 MED FILL — CITALOPRAM HYDROBROMIDE 20 MG PO TABS: 20 MG | ORAL | Qty: 2

## 2013-08-08 MED FILL — NORMAL SALINE FLUSH 0.9 % IV SOLN: 0.9 % | INTRAVENOUS | Qty: 50

## 2013-08-08 NOTE — Progress Notes (Signed)
Patient returned from upper GI. Patient states abdominal pain is 10/10. Dilaudid given at this time.

## 2013-08-08 NOTE — Progress Notes (Signed)
TPN started at this time.

## 2013-08-08 NOTE — Progress Notes (Signed)
Scheduled medications at this time.

## 2013-08-08 NOTE — Progress Notes (Signed)
Patient states pain is 9/10. Dilaudid given at this time.

## 2013-08-08 NOTE — Progress Notes (Signed)
PRN zofran and dilaudid given along with scheduled sucralfate for pt c/o nausea and abdominal pain rated 10/10 (see MAR). Pt sitting up in bed, family visiting at bedside. Pt requested to know whether or not she could have anything by mouth to eat/drink. Page sent to Dr. Altamese Carolina. Awaiting response, pt aware. Pt denies further wants or needs at this time. Low bed, call light w/in reach. Will continue to monitor. Electronically signed by Valma Cava, RN on 08/08/2013 at 7:02 PM

## 2013-08-08 NOTE — Progress Notes (Signed)
PRN dilaudid given for pain along with scheduled medications. See eMAR. TPN rate decreased to 18ml/hr x1 hour. Will cont to monitor.

## 2013-08-08 NOTE — Progress Notes (Signed)
PICC dressing changed at this time.

## 2013-08-08 NOTE — Progress Notes (Signed)
Bedside handoff report completed with Pat, RN.

## 2013-08-08 NOTE — Progress Notes (Signed)
[phone] with this pt prior to admit with [facility] [phone] for home TPN. Discharge planner notified.   Will follow.

## 2013-08-08 NOTE — Progress Notes (Signed)
VS obtained. PRN dilaudid given for 10/10 pain. Scheduled medications given per order. See eMAR. Call light in reach. Will cont to monitor.

## 2013-08-08 NOTE — Progress Notes (Signed)
Dressing changed at this time.

## 2013-08-08 NOTE — Progress Notes (Signed)
Patient assisted c/ bath and linen change at this time.

## 2013-08-08 NOTE — Plan of Care (Signed)
Problem: Risk for Impaired Skin Integrity  Goal: Tissue integrity - skin and mucous membranes  Structural intactness and normal physiological function of skin and  mucous membranes.   Pt. Encouraged and assisted with turning and repositioning frequently in bed. Skin is checked every shift and PRN.        Problem: Nutrition Deficit  Intervention: Nutritional intake assessment  Pt is NPO. Pt is on cyclic TPN. Pt tolerating well.

## 2013-08-08 NOTE — Progress Notes (Signed)
Bedside handoff report completed with Alyssa, RN.

## 2013-08-08 NOTE — Progress Notes (Signed)
Call to GI about pt wanting to eat. Return call from on call MD. No food or drink tonight. Informed pt. Verbalized understanding. Liana Camerer RN

## 2013-08-08 NOTE — Progress Notes (Signed)
Patient off floor for testing.      Labs reviewed:  Lab Results   Component Value Date    CREATININE 0.5* 08/08/2013    BUN 20 08/08/2013    NA 138 08/08/2013    K 3.5 08/08/2013    CL 106 08/08/2013    CO2 24 08/08/2013     Lab Results   Component Value Date    WBC 8.6 08/08/2013    HGB 11.2* 08/08/2013    HCT 34.4* 08/08/2013    MCV 85.0 08/08/2013    PLT 201 08/08/2013     Will f/u results of UGI this afternoon.

## 2013-08-08 NOTE — Progress Notes (Signed)
Complains of pain at 10/10 in chest and back. Medicated with dilaudid and zofran. Will continue to assess. Kinsler Soeder RN

## 2013-08-08 NOTE — Progress Notes (Signed)
VS obtained. FSBS 130. PRN phenergan given for nausea. See eMAR.

## 2013-08-08 NOTE — Progress Notes (Signed)
Assessment complete. See doc flowsheet. Lung sounds are clear, bowel sounds are active in all 4 quadrants. Dressing to abdomen is clean, dry and intact. Reports pain at 7/10. TPN infusing. picc line flushed with blood return noted.  Call light in reach. Belongings in reach. Marcianna Daily RN

## 2013-08-08 NOTE — Consults (Signed)
[facility]  Gastroenterology Progress Note    Rachael Mays is a 57 y.o. female patient.  1. Intractable vomiting    2. Intractable abdominal pain        SUBJECTIVE:  No change in symptoms. Still with chest pain and abd pain from mid chest to umbilicus.     ROS:  Cardiovascular ROS: positive for - chest pain  Gastrointestinal ROS: positive for - abdominal pain  Respiratory ROS: no cough, shortness of breath, or wheezing    Physical    VITALS:  BP 101/56   Pulse 75   Temp(Src) 98.5 ??F (36.9 ??C) (Oral)   Resp 16   Ht 5\' 6"  (1.676 m)   Wt 215 lb (97.523 kg)   BMI 34.72 kg/m2   SpO2 98%  TEMPERATURE:  Current - Temp: 98.5 ??F (36.9 ??C); Max - Temp  Avg: 98.2 ??F (36.8 ??C)  Min: 97.8 ??F (36.6 ??C)  Max: 98.5 ??F (36.9 ??C)    NAD  RRR, Nl s1s2  Lungs CTA Bilaterally, normal effort  Abdomen soft, ND, tender epi, no HSM, Bowel sounds normal  AAOx3, No asterixis     Data    CBC:   Lab Results   Component Value Date    WBC 8.6 08/08/2013    RBC 4.05 08/08/2013    HGB 11.2 08/08/2013    HCT 34.4 08/08/2013    MCV 85.0 08/08/2013    MCH 27.6 08/08/2013    MCHC 32.4 08/08/2013    RDW 14.1 08/08/2013    PLT 201 08/08/2013    MPV 8.3 08/08/2013     Hepatic Function Panel:    Lab Results   Component Value Date    ALKPHOS 150 08/05/2013    ALT 71 08/05/2013    AST 51 08/05/2013    PROT 7.0 08/05/2013    PROT 7.3 03/31/2011    BILITOT 0.4 08/05/2013    BILIDIR 0.1 08/05/2013    IBILI 0.3 08/05/2013         Radiology Review:        ASSESSMENT     Persistent pain - little changes since stent placement for fundal leak.      PLAN     UGI to assess placement of stent and to help exclude persistent leak.    Brigid Re MD

## 2013-08-08 NOTE — Progress Notes (Signed)
Patient assisted to restroom at this time. Shift assessment.

## 2013-08-08 NOTE — Progress Notes (Signed)
Patient states she has abdominal pain and nausea. Phenergan given via PICC at this time. See MAR.

## 2013-08-08 NOTE — Progress Notes (Signed)
Shift assessment and medications at this time.

## 2013-08-09 LAB — POCT GLUCOSE
POC Glucose: 130 mg/dl — ABNORMAL HIGH (ref 70–99)
POC Glucose: 138 mg/dl — ABNORMAL HIGH (ref 70–99)
POC Glucose: 127 mg/dl — ABNORMAL HIGH (ref 70–99)
POC Glucose: 94 mg/dl (ref 70–99)
POC Glucose: 100 mg/dl — ABNORMAL HIGH (ref 70–99)

## 2013-08-09 LAB — CBC WITH AUTO DIFFERENTIAL
Basophils %: 0.5 %
Basophils Absolute: 0 10*3/uL (ref 0.0–0.2)
Eosinophils %: 7.2 %
Eosinophils Absolute: 0.5 10*3/uL (ref 0.0–0.6)
Hematocrit: 32.9 % — ABNORMAL LOW (ref 36.0–48.0)
Hemoglobin: 10.8 g/dL — ABNORMAL LOW (ref 12.0–16.0)
Lymphocytes %: 25.7 %
Lymphocytes Absolute: 1.8 10*3/uL (ref 1.0–5.1)
MCH: 27.8 pg (ref 26.0–34.0)
MCHC: 32.7 g/dL (ref 31.0–36.0)
MCV: 85 fL (ref 80.0–100.0)
MPV: 8.6 fL (ref 5.0–10.5)
Monocytes %: 11.8 %
Monocytes Absolute: 0.8 10*3/uL (ref 0.0–1.3)
Neutrophils %: 54.8 %
Neutrophils Absolute: 3.8 10*3/uL (ref 1.7–7.7)
Platelets: 200 10*3/uL (ref 135–450)
RBC: 3.87 M/uL — ABNORMAL LOW (ref 4.00–5.20)
RDW: 13.9 % (ref 12.4–15.4)
WBC: 7 10*3/uL (ref 4.0–11.0)

## 2013-08-09 LAB — BASIC METABOLIC PANEL
BUN: 23 mg/dL — ABNORMAL HIGH (ref 7–20)
CO2: 29 mmol/L (ref 21–32)
Calcium: 8.7 mg/dL (ref 8.3–10.6)
Chloride: 103 mmol/L (ref 99–110)
Creatinine: 0.7 mg/dL (ref 0.6–1.1)
GFR African American: >60 (ref >60)
GFR Non-African American: >60 (ref >60)
Glucose: 120 mg/dL — ABNORMAL HIGH (ref 70–99)
Potassium: 4.2 mmol/L (ref 3.5–5.1)
Sodium: 137 mmol/L (ref 136–145)

## 2013-08-09 MED ORDER — OXYCODONE-ACETAMINOPHEN 5-325 MG/5ML PO SOLN
5-325 | ORAL | Status: DC | PRN
Start: 2013-08-09 — End: 2013-08-20
  Administered 2013-08-14 – 2013-08-19 (×4): 10 mL via ORAL

## 2013-08-09 MED ADMIN — alteplase (CATHFLO) injection 2 mg: @ 21:00:00 | NDC 50242004164

## 2013-08-09 MED ADMIN — ondansetron (ZOFRAN) injection 4 mg: INTRAVENOUS | @ 04:00:00 | NDC 23155037831

## 2013-08-09 MED ADMIN — gabapentin (NEURONTIN) capsule 300 mg: ORAL | NDC 68084056311

## 2013-08-09 MED ADMIN — sucralfate (CARAFATE) 1 GM/10ML suspension 1 g: ORAL | @ 22:00:00 | NDC 00121074710

## 2013-08-09 MED ADMIN — PN-Adult Premix 5/20 - Standard Electrolytes - Central Line: INTRAVENOUS | @ 22:00:00 | NDC 63323014397

## 2013-08-09 MED ADMIN — sodium chloride flush 0.9 % injection 10 mL: INTRAVENOUS

## 2013-08-09 MED ADMIN — promethazine (PHENERGAN) injection 25 mg: INTRAVENOUS | @ 22:00:00 | NDC 00641092821

## 2013-08-09 MED ADMIN — cetirizine (ZYRTEC) tablet 10 mg: ORAL | @ 13:00:00 | NDC 51079059701

## 2013-08-09 MED ADMIN — montelukast (SINGULAIR) tablet 10 mg: ORAL | @ 13:00:00 | NDC 00006011728

## 2013-08-09 MED ADMIN — alteplase (CATHFLO) injection 2 mg: @ 19:00:00 | NDC 50242004164

## 2013-08-09 MED ADMIN — promethazine (PHENERGAN) injection 25 mg: INTRAVENOUS | @ 16:00:00 | NDC 00641092821

## 2013-08-09 MED ADMIN — promethazine (PHENERGAN) injection 25 mg: INTRAVENOUS | @ 08:00:00 | NDC 00641092821

## 2013-08-09 MED ADMIN — sodium chloride flush 0.9 % injection 10 mL: INTRAVENOUS | @ 13:00:00

## 2013-08-09 MED ADMIN — ondansetron (ZOFRAN) injection 4 mg: INTRAVENOUS | @ 23:00:00 | NDC 23155037831

## 2013-08-09 MED ADMIN — sucralfate (CARAFATE) 1 GM/10ML suspension 1 g: ORAL | @ 16:00:00 | NDC 00121074710

## 2013-08-09 MED ADMIN — sodium chloride flush 0.9 % injection 10 mL: INTRAVENOUS | @ 04:00:00

## 2013-08-09 MED ADMIN — pantoprazole (PROTONIX) injection 40 mg: INTRAVENOUS | @ 13:00:00 | NDC 00008400101

## 2013-08-09 MED ADMIN — pantoprazole (PROTONIX) injection 40 mg: INTRAVENOUS | NDC 00008400101

## 2013-08-09 MED ADMIN — enoxaparin (LOVENOX) injection 40 mg: SUBCUTANEOUS | @ 02:00:00 | NDC 00075801301

## 2013-08-09 MED ADMIN — citalopram (CELEXA) tablet 40 mg: ORAL | @ 13:00:00 | NDC 68084074411

## 2013-08-09 MED ADMIN — sucralfate (CARAFATE) 1 GM/10ML suspension 1 g: ORAL | @ 04:00:00 | NDC 00121074710

## 2013-08-09 MED ADMIN — ondansetron (ZOFRAN) injection 4 mg: INTRAVENOUS | @ 13:00:00 | NDC 23155037831

## 2013-08-09 MED ADMIN — sucralfate (CARAFATE) 1 GM/10ML suspension 1 g: ORAL | @ 10:00:00 | NDC 00121074710

## 2013-08-09 MED FILL — NORMAL SALINE FLUSH 0.9 % IV SOLN: 0.9 % | INTRAVENOUS | Qty: 30

## 2013-08-09 MED FILL — NORMAL SALINE FLUSH 0.9 % IV SOLN: 0.9 % | INTRAVENOUS | Qty: 20

## 2013-08-09 MED FILL — PROMETHAZINE HCL 25 MG/ML IJ SOLN: 25 MG/ML | INTRAMUSCULAR | Qty: 1

## 2013-08-09 MED FILL — ONDANSETRON HCL 4 MG/2ML IJ SOLN: 4 MG/2ML | INTRAMUSCULAR | Qty: 2

## 2013-08-09 MED FILL — NORMAL SALINE FLUSH 0.9 % IV SOLN: 0.9 % | INTRAVENOUS | Qty: 60

## 2013-08-09 MED FILL — SINGULAIR 10 MG PO TABS: 10 MG | ORAL | Qty: 1

## 2013-08-09 MED FILL — SUCRALFATE 1 GM/10ML PO SUSP: 1 GM/0ML | ORAL | Qty: 10

## 2013-08-09 MED FILL — LOVENOX 30 MG/0.3ML SC SOLN: 30 MG/0.3ML | SUBCUTANEOUS | Qty: 0.6

## 2013-08-09 MED FILL — GABAPENTIN 300 MG PO CAPS: 300 MG | ORAL | Qty: 1

## 2013-08-09 MED FILL — CLINIMIX E/DEXTROSE (5/20) 5 % IV SOLN: 5 % | INTRAVENOUS | Qty: 2000

## 2013-08-09 MED FILL — NORMAL SALINE FLUSH 0.9 % IV SOLN: 0.9 % | INTRAVENOUS | Qty: 10

## 2013-08-09 MED FILL — HYDROMORPHONE HCL PF 1 MG/ML IJ SOLN: 1 MG/ML | INTRAMUSCULAR | Qty: 1

## 2013-08-09 MED FILL — PROTONIX 40 MG IV SOLR: 40 MG | INTRAVENOUS | Qty: 40

## 2013-08-09 MED FILL — CATHFLO ACTIVASE 2 MG IJ SOLR: 2 MG | INTRAMUSCULAR | Qty: 2

## 2013-08-09 MED FILL — CETIRIZINE HCL 10 MG PO TABS: 10 MG | ORAL | Qty: 1

## 2013-08-09 MED FILL — ROXICET 5-325 MG/5ML PO SOLN: 5-325 MG/5ML | ORAL | Qty: 10

## 2013-08-09 MED FILL — CITALOPRAM HYDROBROMIDE 20 MG PO TABS: 20 MG | ORAL | Qty: 2

## 2013-08-09 NOTE — Progress Notes (Signed)
Bedside handoff completed with Lynsie, RN.

## 2013-08-09 NOTE — Progress Notes (Signed)
TPN hung and infusing per orders. Positive blood return on PICC. Prn Phenergan administered per pt request. Pt denies further needs.

## 2013-08-09 NOTE — Progress Notes (Signed)
Shift assessment completed, see doc flowsheets. Night medications given, see MAR. No other needs expressed at this time. Dilaudid given as ordered, see MAR. Rating pain 10/10. Family in room. Call light is within reach. Will continue to monitor

## 2013-08-09 NOTE — Progress Notes (Signed)
Bedside handoff completed with Pat, RN.

## 2013-08-09 NOTE — Progress Notes (Signed)
VSS. Pt just showered. Dressing change completed. Pt tolerated well. C/o nausea. Prn Phenergan administered. Denies further needs.

## 2013-08-09 NOTE — Progress Notes (Signed)
Head to toe assessment completed. Pt c/o abdominal pain and nausea. Prn medication administered. Dressings to abdomen clean, dry, and intact. Pt given menu for full liquid diet. Declines further needs. Will continue to monitor.

## 2013-08-09 NOTE — Progress Notes (Signed)
Cath-flo unsuccessful. Pt given prn Dilaudid per request. Rating abdominal pain 10/10.

## 2013-08-09 NOTE — Progress Notes (Signed)
Insulin given as ordered, see MAR. No other needs at this time.

## 2013-08-09 NOTE — Plan of Care (Signed)
Problem: Nausea/Vomiting  Goal: Absence of nausea/vomiting  Outcome: Ongoing  Pt has remained from from vomiting. Prn interventions utilizing to prevent and rid patient of nausea.    Problem: Pain  Goal: Reduction in pain sensation  Outcome: Ongoing  Pt alert and able to effectively voice the presence of pain. Utilizes PRN pain medications as needed. Non-pharmacologic pain interventions discussed with patient. Pain frequently assessed and reassessed. Calls placed to MD if needed.

## 2013-08-09 NOTE — Progress Notes (Signed)
[facility]  Gastroenterology Progress Note    Rachael Mays is a 57 y.o. female patient.  1. Intractable vomiting    2. Intractable abdominal pain        SUBJECTIVE:  Still with pain but looks more comfortable. Wants to eat.     ROS:  Cardiovascular ROS: positive for - chest pain  Gastrointestinal ROS: abdominal pain, no change in bowel habits, or black or bloody stools  Respiratory ROS:     Physical    VITALS:  BP 107/49   Pulse 69   Temp(Src) 98.6 ??F (37 ??C) (Oral)   Resp 16   Ht 5\' 6"  (1.676 m)   Wt 215 lb (97.523 kg)   BMI 34.72 kg/m2   SpO2 99%  TEMPERATURE:  Current - Temp: 98.6 ??F (37 ??C); Max - Temp  Avg: 98.5 ??F (36.9 ??C)  Min: 97.9 ??F (36.6 ??C)  Max: 99 ??F (37.2 ??C)    NAD  RRR, Nl s1s2  Lungs CTA Bilaterally, normal effort  Abdomen soft, ND, NT, no HSM, Bowel sounds normal  AAOx3, No asterixis     Data    CBC:   Lab Results   Component Value Date    WBC 7.0 08/09/2013    RBC 3.87 08/09/2013    HGB 10.8 08/09/2013    HCT 32.9 08/09/2013    MCV 85.0 08/09/2013    MCH 27.8 08/09/2013    MCHC 32.7 08/09/2013    RDW 13.9 08/09/2013    PLT 200 08/09/2013    MPV 8.6 08/09/2013     Hepatic Function Panel:    Lab Results   Component Value Date    ALKPHOS 150 08/05/2013    ALT 71 08/05/2013    AST 51 08/05/2013    PROT 7.0 08/05/2013    PROT 7.3 03/31/2011    BILITOT 0.4 08/05/2013    BILIDIR 0.1 08/05/2013    IBILI 0.3 08/05/2013           ASSESSMENT     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 Pt has not tolerated stent since placed despite changing. Has now had for 2.5 weeks. Pt feels pain is no better and perhaps is getting worse. UGI shows stent to be in good position.       PLAN      Will discuss with surgical team but may need to remove stent and proceed with surgery if leak persists.     Brigid Re MD

## 2013-08-09 NOTE — Progress Notes (Signed)
Department of Family Practice  Adult Daily Progress Note      SUBJECTIVE    Nausea little better but still getting off and on, meds help.  No vomiting or dry heaves but has been NPO.  Still with "the worse heartburn ever".  Dilaudid helps for 2 hrs.      OBJECTIVE    Physical  SpO2: 99 %     Patient Vitals for the past 24 hrs:   BP Temp Temp src Pulse Resp SpO2   08/09/13 0746 107/49 mmHg 98.6 ??F (37 ??C) Oral 69 16 99 %   08/09/13 0414 113/67 mmHg 98.5 ??F (36.9 ??C) Oral 76 16 95 %   08/08/13 2336 91/48 mmHg 99 ??F (37.2 ??C) - 59 16 -   08/08/13 1929 113/69 mmHg 97.9 ??F (36.6 ??C) Oral 59 16 100 %   08/08/13 1500 105/61 mmHg 98.2 ??F (36.8 ??C) Oral 56 16 100 %   08/08/13 1100 92/57 mmHg 98.5 ??F (36.9 ??C) Oral 68 16 99 %     Alert and oriented x 4 NAD, Obese, well hydrated, well developed.  Lung clear  cv RRR  No edema    Data    CBC:   Lab Results   Component Value Date    WBC 7.0 08/09/2013    RBC 3.87 08/09/2013    HGB 10.8 08/09/2013    HCT 32.9 08/09/2013    MCV 85.0 08/09/2013    MCH 27.8 08/09/2013    MCHC 32.7 08/09/2013    RDW 13.9 08/09/2013    PLT 200 08/09/2013    MPV 8.6 08/09/2013     BMP:    Lab Results   Component Value Date    NA 137 08/09/2013    K 4.2 08/09/2013    CL 103 08/09/2013    CO2 29 08/09/2013    BUN 23 08/09/2013    LABALBU 3.2 08/05/2013    CREATININE 0.7 08/09/2013    CREATININE 0.8 08/05/2013    CALCIUM 8.7 08/09/2013    GFRAA >60 08/09/2013    GFRAA >60 03/16/2012    LABGLOM >60 08/09/2013    GLUCOSE 120 08/09/2013       ASSESSMENT AND PLAN:  Abd/chest pain and vomiting-    Discussed with GI, surgery to see, add roxicet orally, dilaudid for breakthru, on carafate and PPI BID, on TPN, monitor

## 2013-08-09 NOTE — Progress Notes (Signed)
Blood glucose at 127, no insulin coverage per order. Rates pain at 8/10, medicated with dilaudid and phenergan for nausea. Denies other needs at this time. Call light in reach. Eddy Liszewski RN

## 2013-08-09 NOTE — Progress Notes (Signed)
Rounded on pt. Pt currently resting in bed with eyes closed. Respirations even and unlabored. Appears comfortable. Call light in reach.

## 2013-08-09 NOTE — Progress Notes (Signed)
Rounded on pt. Pt resting in bed with eyes closed. Respirations even and unlabored. Appears comfortable.

## 2013-08-09 NOTE — Progress Notes (Signed)
Dilaudid and phenergan given as ordered, see MAR. C/o pain rating 10/10 and nausea.

## 2013-08-09 NOTE — Progress Notes (Signed)
VSS. Cath-flo instilled. Will monitor. Pt denies needs.

## 2013-08-09 NOTE — Progress Notes (Signed)
Attempted to draw labs from picc line. Only able to get minimal blood. Flushed both lumen multiple times with small amount of blood back. Will have lab draw labs. Verley Pariseau RN

## 2013-08-10 LAB — CBC WITH AUTO DIFFERENTIAL
Basophils %: 1 %
Basophils Absolute: 0.1 10*3/uL (ref 0.0–0.2)
Eosinophils %: 6.3 %
Eosinophils Absolute: 0.5 10*3/uL (ref 0.0–0.6)
Hematocrit: 33.5 % — ABNORMAL LOW (ref 36.0–48.0)
Hemoglobin: 11.2 g/dL — ABNORMAL LOW (ref 12.0–16.0)
Lymphocytes %: 26.5 %
Lymphocytes Absolute: 2.1 10*3/uL (ref 1.0–5.1)
MCH: 27.9 pg (ref 26.0–34.0)
MCHC: 33.3 g/dL (ref 31.0–36.0)
MCV: 83.7 fL (ref 80.0–100.0)
MPV: 8.3 fL (ref 5.0–10.5)
Monocytes %: 12 %
Monocytes Absolute: 0.9 10*3/uL (ref 0.0–1.3)
Neutrophils %: 54.2 %
Neutrophils Absolute: 4.2 10*3/uL (ref 1.7–7.7)
Platelets: 211 10*3/uL (ref 135–450)
RBC: 4 M/uL (ref 4.00–5.20)
RDW: 14.4 % (ref 12.4–15.4)
WBC: 7.8 10*3/uL (ref 4.0–11.0)

## 2013-08-10 LAB — BASIC METABOLIC PANEL
BUN: 21 mg/dL — ABNORMAL HIGH (ref 7–20)
CO2: 27 mmol/L (ref 21–32)
Calcium: 8.6 mg/dL (ref 8.3–10.6)
Chloride: 101 mmol/L (ref 99–110)
Creatinine: 0.6 mg/dL (ref 0.6–1.1)
GFR African American: >60 (ref >60)
GFR Non-African American: >60 (ref >60)
Glucose: 96 mg/dL (ref 70–99)
Potassium: 4 mmol/L (ref 3.5–5.1)
Sodium: 134 mmol/L — ABNORMAL LOW (ref 136–145)

## 2013-08-10 LAB — POCT GLUCOSE
POC Glucose: 149 mg/dl — ABNORMAL HIGH (ref 70–99)
POC Glucose: 127 mg/dl — ABNORMAL HIGH (ref 70–99)
POC Glucose: 110 mg/dl — ABNORMAL HIGH (ref 70–99)
POC Glucose: 92 mg/dl (ref 70–99)

## 2013-08-10 MED ADMIN — enoxaparin (LOVENOX) injection 40 mg: SUBCUTANEOUS | NDC 00075801401

## 2013-08-10 MED ADMIN — promethazine (PHENERGAN) injection 25 mg: INTRAVENOUS | @ 22:00:00 | NDC 00641092821

## 2013-08-10 MED ADMIN — pantoprazole (PROTONIX) injection 40 mg: INTRAVENOUS | NDC 00008400101

## 2013-08-10 MED ADMIN — promethazine (PHENERGAN) injection 25 mg: INTRAVENOUS | @ 16:00:00 | NDC 00641092821

## 2013-08-10 MED ADMIN — cetirizine (ZYRTEC) tablet 10 mg: ORAL | @ 13:00:00 | NDC 51079059701

## 2013-08-10 MED ADMIN — sodium chloride flush 0.9 % injection 10 mL: INTRAVENOUS

## 2013-08-10 MED ADMIN — sucralfate (CARAFATE) 1 GM/10ML suspension 1 g: ORAL | @ 22:00:00 | NDC 00121074710

## 2013-08-10 MED ADMIN — gabapentin (NEURONTIN) capsule 300 mg: ORAL | NDC 68084056311

## 2013-08-10 MED ADMIN — promethazine (PHENERGAN) injection 25 mg: INTRAVENOUS | @ 10:00:00 | NDC 00641092821

## 2013-08-10 MED ADMIN — sucralfate (CARAFATE) 1 GM/10ML suspension 1 g: ORAL | @ 16:00:00 | NDC 66689079001

## 2013-08-10 MED ADMIN — ondansetron (ZOFRAN) injection 4 mg: INTRAVENOUS | @ 08:00:00 | NDC 23155037831

## 2013-08-10 MED ADMIN — sucralfate (CARAFATE) 1 GM/10ML suspension 1 g: ORAL | @ 04:00:00 | NDC 00121074710

## 2013-08-10 MED ADMIN — pantoprazole (PROTONIX) injection 40 mg: INTRAVENOUS | @ 13:00:00 | NDC 00008400101

## 2013-08-10 MED ADMIN — sodium chloride flush 0.9 % injection 10 mL: INTRAVENOUS | @ 13:00:00

## 2013-08-10 MED ADMIN — sucralfate (CARAFATE) 1 GM/10ML suspension 1 g: ORAL | @ 09:00:00 | NDC 00121074710

## 2013-08-10 MED ADMIN — PN-Adult Premix 5/20 - Standard Electrolytes - Central Line: INTRAVENOUS | @ 22:00:00 | NDC 00338112504

## 2013-08-10 MED ADMIN — montelukast (SINGULAIR) tablet 10 mg: ORAL | @ 13:00:00 | NDC 68084062011

## 2013-08-10 MED ADMIN — ondansetron (ZOFRAN) injection 4 mg: INTRAVENOUS | @ 18:00:00 | NDC 23155037831

## 2013-08-10 MED ADMIN — insulin lispro (HUMALOG) injection pen 0-6 Units: SUBCUTANEOUS | @ 02:00:00 | NDC 00002879901

## 2013-08-10 MED ADMIN — citalopram (CELEXA) tablet 40 mg: ORAL | @ 13:00:00 | NDC 68084074411

## 2013-08-10 MED ADMIN — promethazine (PHENERGAN) injection 25 mg: INTRAVENOUS | @ 04:00:00 | NDC 00641092821

## 2013-08-10 MED ADMIN — ALPRAZolam (XANAX) tablet 0.5 mg: ORAL | @ 21:00:00 | NDC 68084067211

## 2013-08-10 MED FILL — CLINIMIX E/DEXTROSE (5/20) 5 % IV SOLN: 5 % | INTRAVENOUS | Qty: 2000

## 2013-08-10 MED FILL — HYDROMORPHONE HCL PF 1 MG/ML IJ SOLN: 1 MG/ML | INTRAMUSCULAR | Qty: 1

## 2013-08-10 MED FILL — ONDANSETRON HCL 4 MG/2ML IJ SOLN: 4 MG/2ML | INTRAMUSCULAR | Qty: 2

## 2013-08-10 MED FILL — PROTONIX 40 MG IV SOLR: 40 MG | INTRAVENOUS | Qty: 40

## 2013-08-10 MED FILL — SUCRALFATE 1 GM/10ML PO SUSP: 1 GM/0ML | ORAL | Qty: 10

## 2013-08-10 MED FILL — PROMETHAZINE HCL 25 MG/ML IJ SOLN: 25 MG/ML | INTRAMUSCULAR | Qty: 1

## 2013-08-10 MED FILL — CETIRIZINE HCL 10 MG PO TABS: 10 MG | ORAL | Qty: 1

## 2013-08-10 MED FILL — NORMAL SALINE FLUSH 0.9 % IV SOLN: 0.9 % | INTRAVENOUS | Qty: 20

## 2013-08-10 MED FILL — NORMAL SALINE FLUSH 0.9 % IV SOLN: 0.9 % | INTRAVENOUS | Qty: 10

## 2013-08-10 MED FILL — SINGULAIR 10 MG PO TABS: 10 MG | ORAL | Qty: 1

## 2013-08-10 MED FILL — GABAPENTIN 300 MG PO CAPS: 300 MG | ORAL | Qty: 1

## 2013-08-10 MED FILL — LOVENOX 40 MG/0.4ML SC SOLN: 40 MG/0.4ML | SUBCUTANEOUS | Qty: 0.4

## 2013-08-10 MED FILL — NORMAL SALINE FLUSH 0.9 % IV SOLN: 0.9 % | INTRAVENOUS | Qty: 60

## 2013-08-10 MED FILL — CITALOPRAM HYDROBROMIDE 20 MG PO TABS: 20 MG | ORAL | Qty: 2

## 2013-08-10 MED FILL — ALPRAZOLAM 0.5 MG PO TABS: 0.5 MG | ORAL | Qty: 1

## 2013-08-10 NOTE — Progress Notes (Signed)
Bedside handoff report complete with Britt Boozer, RN. Pt resting in bed, appears comfortable. Will continue to monitor. Call light in reach.

## 2013-08-10 NOTE — Progress Notes (Signed)
Bedside handoff with Jaime, RN. Pt resting in bed at this time. Pt does not express any needs at this time. Bed in lowest position, bed alarm engaged and call light in reach

## 2013-08-10 NOTE — Progress Notes (Signed)
VSS. Zofran and dilaudid prn IV meds administered for nausea and pain. Pt denies any further needs at this time. Will continue to monitor. Call light in reach.

## 2013-08-10 NOTE — Progress Notes (Signed)
VSS. Pt resting comfortably in bed. No needs at this time. Call light within reach. Will continue to monitor.

## 2013-08-10 NOTE — Progress Notes (Signed)
S:  Continues to have burning pain from epigastrium to navel; tolerated can of sprite yesterday without emesis; no retching though nausea continues; requiring IV pain medication for pain relief    O:  BP 107/68   Pulse 73   Temp(Src) 98.6 ??F (37 ??C) (Oral)   Resp 16   Ht 5\' 6"  (1.676 m)   Wt 215 lb (97.523 kg)   BMI 34.72 kg/m2   SpO2 98%  gen-alert, no distress  cvs-rrr, no murmur  resp-cta bilaterally  abd-+bs, soft, nt, nd    Lab Results   Component Value Date    CREATININE 0.6 08/10/2013    BUN 21* 08/10/2013    NA 134* 08/10/2013    K 4.0 08/10/2013    CL 101 08/10/2013    CO2 27 08/10/2013     Lab Results   Component Value Date    WBC 7.8 08/10/2013    HGB 11.2* 08/10/2013    HCT 33.5* 08/10/2013    MCV 83.7 08/10/2013    PLT 211 08/10/2013     A/P:  57 y.o. female  1.  Abdominal pain and nausea-continue pain control, TPN, and clears as tolerated; surgery and GI recs appreciated

## 2013-08-10 NOTE — Plan of Care (Signed)
Problem: Falls - Risk of  Goal: Absence of falls  Outcome: Ongoing  Pt does not score as a high fall risk.  Standard fall precautions in place. Will reassess fall risk each shift and PRN.

## 2013-08-10 NOTE — Progress Notes (Signed)
[facility]  Gastroenterology Progress Note    Rachael Mays is a 57 y.o. female patient.  1. Intractable vomiting    2. Intractable abdominal pain        SUBJECTIVE:  Still with pain but looks comfortable. Tolerating diet. No new c/o.     ROS:  Cardiovascular ROS: positive for - chest pain  Gastrointestinal ROS: abdominal pain, no change in bowel habits, or black or bloody stools  Respiratory ROS:     Physical    VITALS:  BP 107/68   Pulse 73   Temp(Src) 98.6 ??F (37 ??C) (Oral)   Resp 16   Ht 5\' 6"  (1.676 m)   Wt 215 lb (97.523 kg)   BMI 34.72 kg/m2   SpO2 98%  TEMPERATURE:  Current - Temp: 98.6 ??F (37 ??C); Max - Temp  Avg: 98.4 ??F (36.9 ??C)  Min: 98.1 ??F (36.7 ??C)  Max: 98.6 ??F (37 ??C)    NAD  RRR, Nl s1s2  Lungs CTA Bilaterally, normal effort  Abdomen soft, ND, NT, no HSM, Bowel sounds normal  AAOx3, No asterixis     Data    CBC:   Lab Results   Component Value Date    WBC 7.8 08/10/2013    RBC 4.00 08/10/2013    HGB 11.2 08/10/2013    HCT 33.5 08/10/2013    MCV 83.7 08/10/2013    MCH 27.9 08/10/2013    MCHC 33.3 08/10/2013    RDW 14.4 08/10/2013    PLT 211 08/10/2013    MPV 8.3 08/10/2013     Hepatic Function Panel:    Lab Results   Component Value Date    ALKPHOS 150 08/05/2013    ALT 71 08/05/2013    AST 51 08/05/2013    PROT 7.0 08/05/2013    PROT 7.3 03/31/2011    BILITOT 0.4 08/05/2013    BILIDIR 0.1 08/05/2013    IBILI 0.3 08/05/2013           ASSESSMENT     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 Pt has not tolerated stent since placed despite changing. Has now had for 2.5 weeks. Pt feels pain is no better. UGI shows stent to be in good position. She is tolerating a diet. I explained that it would be preferable to wait another 7-10 days to pull stent since if we pull it and the leak persists, she will require an involved surgery. She understands and does not object to waiting but would like better outpt pain control.       PLAN      Pt is tolerating a diet. If we can control pain with oral meds, ok from GI standpoint for  d/c. D/w Dr Suzi Roots.      Brigid Re MD

## 2013-08-10 NOTE — Progress Notes (Signed)
Shift assessment complete; pt resting comfortably in bed.

## 2013-08-10 NOTE — Progress Notes (Signed)
Pt resting comfortably in bed. No needs at this time. Call light within reach. Will continue to monitor.

## 2013-08-10 NOTE — Progress Notes (Signed)
Labs obtained, zofran and dilaudid given as ordered, see MAR. BS is WNL no coverage required. Will continue to monitor Wallie Char, RN

## 2013-08-10 NOTE — Progress Notes (Signed)
Bedside handoff with Lynsie, RN. Pt resting in bed at this time. Pt does not express any needs at this time. Bed in lowest position and call light in reach

## 2013-08-11 LAB — POCT GLUCOSE
POC Glucose: 100 mg/dl — ABNORMAL HIGH (ref 70–99)
POC Glucose: 107 mg/dl — ABNORMAL HIGH (ref 70–99)
POC Glucose: 119 mg/dl — ABNORMAL HIGH (ref 70–99)
POC Glucose: 142 mg/dl — ABNORMAL HIGH (ref 70–99)
POC Glucose: 142 mg/dl — ABNORMAL HIGH (ref 70–99)
POC Glucose: 84 mg/dl (ref 70–99)
POC Glucose: 95 mg/dl (ref 70–99)

## 2013-08-11 LAB — MAGNESIUM: Magnesium: 2.2 mg/dL (ref 1.80–2.40)

## 2013-08-11 LAB — CBC WITH AUTO DIFFERENTIAL
Basophils %: 0.5 %
Basophils Absolute: 0 10*3/uL (ref 0.0–0.2)
Eosinophils %: 6.2 %
Eosinophils Absolute: 0.4 10*3/uL (ref 0.0–0.6)
Hematocrit: 33.2 % — ABNORMAL LOW (ref 36.0–48.0)
Hemoglobin: 10.8 g/dL — ABNORMAL LOW (ref 12.0–16.0)
Lymphocytes %: 23.9 %
Lymphocytes Absolute: 1.7 10*3/uL (ref 1.0–5.1)
MCH: 27.5 pg (ref 26.0–34.0)
MCHC: 32.4 g/dL (ref 31.0–36.0)
MCV: 84.9 fL (ref 80.0–100.0)
MPV: 8 fL (ref 5.0–10.5)
Monocytes %: 11.5 %
Monocytes Absolute: 0.8 10*3/uL (ref 0.0–1.3)
Neutrophils %: 57.9 %
Neutrophils Absolute: 4.1 10*3/uL (ref 1.7–7.7)
Platelets: 226 10*3/uL (ref 135–450)
RBC: 3.9 M/uL — ABNORMAL LOW (ref 4.00–5.20)
RDW: 14 % (ref 12.4–15.4)
WBC: 7.1 10*3/uL (ref 4.0–11.0)

## 2013-08-11 LAB — BASIC METABOLIC PANEL
BUN: 19 mg/dL (ref 7–20)
CO2: 28 mmol/L (ref 21–32)
Calcium: 8.8 mg/dL (ref 8.3–10.6)
Chloride: 101 mmol/L (ref 99–110)
Creatinine: 0.8 mg/dL (ref 0.6–1.1)
GFR African American: >60 (ref >60)
GFR Non-African American: >60 (ref >60)
Glucose: 112 mg/dL — ABNORMAL HIGH (ref 70–99)
Potassium: 4.2 mmol/L (ref 3.5–5.1)
Sodium: 137 mmol/L (ref 136–145)

## 2013-08-11 LAB — PHOSPHORUS: Phosphorus: 3.4 mg/dL (ref 2.5–4.9)

## 2013-08-11 MED ADMIN — promethazine (PHENERGAN) injection 25 mg: INTRAVENOUS | @ 04:00:00 | NDC 00641092821

## 2013-08-11 MED ADMIN — ondansetron (ZOFRAN) injection 4 mg: INTRAVENOUS | @ 22:00:00 | NDC 23155037831

## 2013-08-11 MED ADMIN — montelukast (SINGULAIR) tablet 10 mg: ORAL | @ 13:00:00 | NDC 68084062011

## 2013-08-11 MED ADMIN — ondansetron (ZOFRAN) injection 4 mg: INTRAVENOUS | @ 02:00:00 | NDC 23155037831

## 2013-08-11 MED ADMIN — insulin lispro (HUMALOG) injection pen 0-6 Units: SUBCUTANEOUS | @ 08:00:00 | NDC 00002879901

## 2013-08-11 MED ADMIN — citalopram (CELEXA) tablet 40 mg: ORAL | @ 13:00:00 | NDC 68084074411

## 2013-08-11 MED ADMIN — PN-Adult Premix 5/20 - Standard Electrolytes - Central Line: INTRAVENOUS | @ 22:00:00 | NDC 63323014397

## 2013-08-11 MED ADMIN — sucralfate (CARAFATE) 1 GM/10ML suspension 1 g: ORAL | @ 17:00:00 | NDC 00121074710

## 2013-08-11 MED ADMIN — sucralfate (CARAFATE) 1 GM/10ML suspension 1 g: ORAL | @ 10:00:00 | NDC 00121074710

## 2013-08-11 MED ADMIN — ondansetron (ZOFRAN) injection 4 mg: INTRAVENOUS | @ 15:00:00 | NDC 23155037831

## 2013-08-11 MED ADMIN — sodium chloride flush 0.9 % injection 10 mL: INTRAVENOUS | @ 13:00:00

## 2013-08-11 MED ADMIN — scopolamine (TRANSDERM-SCOP) 1.5 MG 1 patch: TRANSDERMAL | @ 13:00:00 | NDC 10019055301

## 2013-08-11 MED ADMIN — gabapentin (NEURONTIN) capsule 300 mg: ORAL | @ 02:00:00 | NDC 68084056311

## 2013-08-11 MED ADMIN — cetirizine (ZYRTEC) tablet 10 mg: ORAL | @ 13:00:00 | NDC 51079059701

## 2013-08-11 MED ADMIN — sucralfate (CARAFATE) 1 GM/10ML suspension 1 g: ORAL | @ 22:00:00 | NDC 00121074710

## 2013-08-11 MED ADMIN — promethazine (PHENERGAN) injection 25 mg: INTRAVENOUS | @ 20:00:00 | NDC 00641092821

## 2013-08-11 MED ADMIN — ondansetron (ZOFRAN) injection 4 mg: INTRAVENOUS | @ 09:00:00 | NDC 23155037831

## 2013-08-11 MED ADMIN — pantoprazole (PROTONIX) injection 40 mg: INTRAVENOUS | @ 02:00:00 | NDC 00008400101

## 2013-08-11 MED ADMIN — pantoprazole (PROTONIX) injection 40 mg: INTRAVENOUS | @ 13:00:00 | NDC 00008400101

## 2013-08-11 MED ADMIN — sodium chloride flush 0.9 % injection 10 mL: INTRAVENOUS | @ 02:00:00

## 2013-08-11 MED ADMIN — sucralfate (CARAFATE) 1 GM/10ML suspension 1 g: ORAL | @ 04:00:00 | NDC 00121074710

## 2013-08-11 MED ADMIN — promethazine (PHENERGAN) injection 25 mg: INTRAVENOUS | @ 11:00:00 | NDC 00641092821

## 2013-08-11 MED ADMIN — enoxaparin (LOVENOX) injection 40 mg: SUBCUTANEOUS | @ 02:00:00 | NDC 00075801401

## 2013-08-11 MED FILL — ONDANSETRON HCL 4 MG/2ML IJ SOLN: 4 MG/2ML | INTRAMUSCULAR | Qty: 2

## 2013-08-11 MED FILL — PROMETHAZINE HCL 25 MG/ML IJ SOLN: 25 MG/ML | INTRAMUSCULAR | Qty: 1

## 2013-08-11 MED FILL — CITALOPRAM HYDROBROMIDE 20 MG PO TABS: 20 MG | ORAL | Qty: 2

## 2013-08-11 MED FILL — PROTONIX 40 MG IV SOLR: 40 MG | INTRAVENOUS | Qty: 40

## 2013-08-11 MED FILL — HYDROMORPHONE HCL PF 1 MG/ML IJ SOLN: 1 MG/ML | INTRAMUSCULAR | Qty: 1

## 2013-08-11 MED FILL — NORMAL SALINE FLUSH 0.9 % IV SOLN: 0.9 % | INTRAVENOUS | Qty: 20

## 2013-08-11 MED FILL — NORMAL SALINE FLUSH 0.9 % IV SOLN: 0.9 % | INTRAVENOUS | Qty: 10

## 2013-08-11 MED FILL — SUCRALFATE 1 GM/10ML PO SUSP: 1 GM/0ML | ORAL | Qty: 10

## 2013-08-11 MED FILL — LOVENOX 40 MG/0.4ML SC SOLN: 40 MG/0.4ML | SUBCUTANEOUS | Qty: 0.4

## 2013-08-11 MED FILL — TRANSDERM-SCOP (1.5 MG) 1 MG/3DAYS TD PT72: 1 MG/3DAYS | TRANSDERMAL | Qty: 1

## 2013-08-11 MED FILL — CLINIMIX E/DEXTROSE (5/20) 5 % IV SOLN: 5 % | INTRAVENOUS | Qty: 2000

## 2013-08-11 MED FILL — CETIRIZINE HCL 10 MG PO TABS: 10 MG | ORAL | Qty: 1

## 2013-08-11 MED FILL — SINGULAIR 10 MG PO TABS: 10 MG | ORAL | Qty: 1

## 2013-08-11 MED FILL — GABAPENTIN 300 MG PO CAPS: 300 MG | ORAL | Qty: 1

## 2013-08-11 MED FILL — NORMAL SALINE FLUSH 0.9 % IV SOLN: 0.9 % | INTRAVENOUS | Qty: 30

## 2013-08-11 NOTE — Progress Notes (Signed)
This RN rounded on pt. Pt awake in bed playing on tablet. Denies any other needs at this time. Call light in reach.

## 2013-08-11 NOTE — Progress Notes (Signed)
[facility]  Gastroenterology Progress Note    Rachael Mays is a 57 y.o. female patient.  1. Intractable vomiting    2. Intractable abdominal pain        SUBJECTIVE:  Still with pain but looks comfortable. Tolerating diet. No new c/o.     ROS:  Cardiovascular ROS: positive for - chest pain  Gastrointestinal ROS: abdominal pain, no change in bowel habits, or black or bloody stools  Respiratory ROS:     Physical    VITALS:  BP 117/71   Pulse 69   Temp(Src) 98.6 ??F (37 ??C) (Oral)   Resp 16   Ht 5\' 6"  (1.676 m)   Wt 215 lb (97.523 kg)   BMI 34.72 kg/m2   SpO2 96%  TEMPERATURE:  Current - Temp: 98.6 ??F (37 ??C); Max - Temp  Avg: 98.4 ??F (36.9 ??C)  Min: 98 ??F (36.7 ??C)  Max: 98.6 ??F (37 ??C)    NAD  RRR, Nl s1s2  Lungs CTA Bilaterally, normal effort  Abdomen soft, ND, NT, no HSM, Bowel sounds normal  AAOx3, No asterixis     Data    CBC:   Lab Results   Component Value Date    WBC 7.1 08/11/2013    RBC 3.90 08/11/2013    HGB 10.8 08/11/2013    HCT 33.2 08/11/2013    MCV 84.9 08/11/2013    MCH 27.5 08/11/2013    MCHC 32.4 08/11/2013    RDW 14.0 08/11/2013    PLT 226 08/11/2013    MPV 8.0 08/11/2013     Hepatic Function Panel:    Lab Results   Component Value Date    ALKPHOS 150 08/05/2013    ALT 71 08/05/2013    AST 51 08/05/2013    PROT 7.0 08/05/2013    PROT 7.3 03/31/2011    BILITOT 0.4 08/05/2013    BILIDIR 0.1 08/05/2013    IBILI 0.3 08/05/2013           ASSESSMENT / PLAN    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 Pt has not tolerated stent since placed despite changing. Has now had for 22 days. Pt feels pain is no better. UGI shows stent to be in good position. She is tolerating a diet. I explained that it would be preferable to wait another 7-10 days to pull stent. If, however, she is not able to tolerate over the weekend, I'll d/w Dr Daphine Deutscher who will be back in town and consider removing early next week with a f/u UGI.     Brigid Re MD

## 2013-08-11 NOTE — Progress Notes (Signed)
Pt in bed, denies any needs at this time. Will continue to monitor. Call light in reach.

## 2013-08-11 NOTE — Progress Notes (Signed)
[facility]  Continue to follow for home care.  [facility] [phone] in place for home infusion.

## 2013-08-11 NOTE — Progress Notes (Signed)
Assessment complete and morning medications given. Pt awake in bed watching tv. PRN dilaudid for pain rated 10/10.  Pt denies any other needs at this time. Call light in reach.

## 2013-08-11 NOTE — Plan of Care (Signed)
Problem: Nausea/Vomiting  Goal: Absence of nausea/vomiting  Outcome: Ongoing  Pt will remain free from N/V. Pt denies N/V at this time. PRN phenergan and zofran as needed.     Problem: Pain  Goal: Reduction in pain sensation  Outcome: Ongoing  Pt can rate pain on a 0-10 scale. Pt pain will remain at a level that is tolerable to pt. PRN pain medication as needed.

## 2013-08-11 NOTE — Progress Notes (Signed)
PRN phenergan IV med administered for nausea.

## 2013-08-11 NOTE — Progress Notes (Signed)
Prn dilaudid 1mg  IV med administered for pain. Pt denies any further needs at this time. Will continue to monitor. Call light in reach.

## 2013-08-11 NOTE — Progress Notes (Signed)
Shift assessment completed and documented per flow sheet.  Scheduled medications given according to e-MAR.  PRN Dilaudid given for pain rated at 10/10.  Red and purple lumen of PICC line flushed; TPN infusing w/o complication.  Dressings to abdomen and CDI.  Family visiting at bedside.  No other needs.  Call light and bedside table within reach.

## 2013-08-11 NOTE — Progress Notes (Signed)
This RN rounded on pt, pt awake in bed visiting with family. PRN dilaudid and zofran given. TPN started at 24ml/hr. Pt denies any other needs. Call light in reach.

## 2013-08-11 NOTE — Plan of Care (Signed)
Problem: Nausea/Vomiting  Goal: Absence of nausea/vomiting  Outcome: Ongoing  Patient has PRN Zofran and Phenergan for the relief of nausea.    Problem: Pain  Goal: Reduction in pain sensation  Outcome: Ongoing  Assess patient???s pain level and expectations for pain relief.  RN will administer analgesics as ordered, evaluating effectiveness thereafter.   Patient has PRN Dilaudid for pain relief and is able to request it when needed.

## 2013-08-11 NOTE — Plan of Care (Signed)
Problem: Falls - Risk of  Goal: Absence of falls  Pt will remain free of falls and use call light to notify RN of needs, call light in reach, bed in low position with brakes on and  top 2 side rails up.  Bed alarm on as needed.        Problem: Pain  Goal: Control of acute pain  Pain/discomfort being managed with PRN analgesics per MD orders. Pt able to express presence and absence of pain and rate pain appropriately using numerical scale.

## 2013-08-11 NOTE — Consults (Signed)
NUTRITION THERAPY ASSESSMENT     Admission date:08/05/2013      Timepoint: f/u eval     Rachael Mays is a 57 y.o. female  Pt admitted for emesis.  Hx of lap band (04/2008), band removed (06/2013), metal mesh stent stomach (07/2013), HTN, back pain    09/05- Patient diet was advanced to GI Soft and ensure started, however patient N/V continues and PO intake remains minimal. Patient reports less N today; had just ordered breakfast when RD arrived. Feeling hungry. Continues on PN overnight - 50 ml/hr taper up/down and 166 ml/hr x 12 hours.     9/1 Initial, consult for home TPN; pt NPO and on PN at 190 cc/hr x 10 hr with 1 hr x 50cc/hr taper up and down. Per med record, pt s/p lap band removal after band erosion, pt cont w/ N&V, on home PN. Records reveal 10 lb wt loss x 2 months (5% BW).     Previous admission:  07/2013 Rachael Mays is a 58 y.o. female admitted d/t had esophageal stent placed yesterday presents with uncontrolled pain and nausea and vomiting, unable to swallow; She was recently admitted 7/30 for gastric band erosion requiring removal. She was started on PN during that admission. Has been on nightly TPN at home. RD consult for PN. Currently NPO on PN @ 40 ml/hr.   8/25: Po intake is improved per pt. Reports had bites of egg & hash browns, a piece of toast, & choc milk for bkf. Cyclic PN will con't until pt able to consume adequate nutrition with po alone.   8/20: Pt is tolerating small amounts of clear liquids. Cyclic PN tolerated well @ goal. Cyclic PN: 50 ml taper plus 161 ml/hr x 10 hr    OBJECTIVE DATA  Patient Active Problem List    Diagnosis Date Noted   ??? HTN (hypertension) 07/24/2013   ??? Hyperglycemia 07/24/2013   ??? Wound infection (HCC) 07/22/2013   ??? Complication of gastric banding 07/05/2013   ??? 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 Vertigo 04/11/2013   ??? Dizziness 03/29/2013   ??? 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 Morbid obesity 02/21/2009   ??? Back Pain  01/23/2009   ??? Deep Vein Thrombosis 01/23/2009   ??? Depression 01/23/2009   ??? High Cholesterol 01/23/2009   ??? Hypertension 01/23/2009   ??? Pulmonary Embolism 01/23/2009   ??? Nausea & vomiting 01/23/2009   ??? Abdominal  pain, other specified site 01/23/2009     Past Medical History   Diagnosis Date   ??? Anxiety    ??? Asthma    ??? Chronic back pain    ??? Deep vein thrombosis    ??? Depression    ??? GERD (gastroesophageal reflux disease)      NO LONGER SINCE LAP   ??? Pulmonary embolism (HCC)    ??? Obesity      hx of; had lap band   ??? Hyperlipidemia      hx; resolved with lap band   ??? GERD (gastroesophageal reflux disease) 01/23/2009   ??? Hypertension        Labs  Recent Labs      08/09/13   0553  08/10/13   0405  08/11/13   0550   NA  137  134*  137   K  4.2  4.0  4.2   CL  103  101  101   CO2  29  27  28   BUN  23*  21*  19   CREATININE  0.7  0.6  0.8   GLUCOSE  120*  96  112*     Lab Results   Component Value Date    PHOS 3.4 08/11/2013       No results found for this basename: AST, ALT, ALB, BILIDIR, BILITOT, ALKPHOS,  in the last 72 hours  Lab Results   Component Value Date    LABALBU 3.2 08/05/2013      Lab Results   Component Value Date    TRIG 102 07/24/2013    HDL 60 06/30/2013    HDL 52 03/31/2011    LDLCALC 138 06/30/2013    LABVLDL 19 06/30/2013     No results found for this basename: LIPASE,  in the last 72 hours  Lab Results   Component Value Date    LABA1C 5.2 03/26/2010       Medications  Continuous Medications:      ??? PN-Adult Premix 5/20 - Standard Electrolytes - Central Line       Scheduled Medications:   ??? pantoprazole  40 mg Intravenous BID   ??? sucralfate  1 g Oral Q6H SCH   ??? insulin lispro  0-6 Units Subcutaneous Q6H   ??? cetirizine  10 mg Oral Daily   ??? cloNIDine  1 patch Transdermal Weekly   ??? citalopram  40 mg Oral Daily   ??? gabapentin  300 mg Oral Nightly   ??? montelukast  10 mg Oral Daily   ??? scopolamine  1 patch Transdermal Q72H   ??? sodium chloride flush  10 mL Intravenous Q12H [facility]   ??? enoxaparin  40 mg Subcutaneous  Nightly       Anthropometric Measures   Height: 5\' 6"  (167.6 cm)   Current Weight: Weight: 215 lb (97.523 kg)   Admission weight: 215 lb (97.523 kg)  Weight Method: Stated  If applicable: n/a  Usual Body Weight:  lb       Ideal Body Weight:  lb      % Ideal Body Weight:  %  Adjusted Body Weight:  lb    Body mass index is 34.72 kg/(m^2).   Classified as:  Less than 18.5 Underweight  18.5-24.9 Normal Weight  25-29.9 Overweight  30-34.9 Obese Class I  35-39.9 Obese Class II  Greater than or equal to 40 Obese Class III    No data found.    Wt Readings from Last 50 Encounters:   08/05/13 215 lb (97.523 kg)   08/01/13 240 lb 4.8 oz (109 kg)   07/20/13 205 lb (92.987 kg)   07/09/13 236 lb (107.049 kg)   06/29/13 231 lb (104.781 kg)   06/12/13 225 lb (102.059 kg)   06/07/13 228 lb 8 oz (103.647 kg)   05/30/13 229 lb (103.874 kg)   05/22/13 230 lb 8 oz (104.554 kg)   05/19/13 231 lb 11.3 oz (105.1 kg)   05/18/13 225 lb (102.059 kg)   05/18/13 232 lb (105.235 kg)   04/27/13 229 lb 3.2 oz (103.964 kg)   04/17/13 230 lb (104.327 kg)   04/03/13 231 lb (104.781 kg)   03/29/13 228 lb 8 oz (103.647 kg)   03/10/13 227 lb 8 oz (103.193 kg)   02/23/13 227 lb 9.6 oz (103.239 kg)   12/28/12 217 lb (98.431 kg)   12/15/12 216 lb (97.977 kg)   10/20/12 213 lb (96.616 kg)   06/24/12 212 lb (96.163 kg)  06/02/12 210 lb (95.255 kg)   05/31/12 208 lb 3.2 oz (94.439 kg)   05/19/12 209 lb (94.802 kg)   04/14/12 211 lb (95.709 kg)   03/17/12 213 lb (96.616 kg)   03/16/12 214 lb (97.07 kg)   12/23/11 223 lb (101.152 kg)   11/14/11 220 lb (99.791 kg)   11/07/11 220 lb (99.791 kg)   11/03/11 226 lb (102.513 kg)   05/15/11 233 lb 12.8 oz (106.051 kg)   04/08/10 240 lb (108.863 kg)   04/01/10 240 lb (108.863 kg)   03/26/10 241 lb 2 oz (109.374 kg)   02/06/10 248 lb 3 oz (112.577 kg)   11/07/09 243 lb (110.224 kg)   10/23/09 235 lb (106.595 kg)   08/07/09 246 lb (111.585 kg)   05/02/09 242 lb 5 oz (109.912 kg)   02/21/09 245 lb (111.131 kg)   01/23/09  244 lb 4 oz (110.791 kg)   11/19/08 243 lb 9 oz (110.479 kg)       Comparative Standards    Weight Used: 69 kg ABW  Estimated Calorie Needs: 1700-2000 (based on 25- 30kcal/kg)  Estimated Protein Needs: 82-103 (based on 1.2- 1.5gm/kg)  Estimated Fluid Needs: 1700-2000 (based on 29mL/kcal)    Nutrition-focused Physical Findings           Emesis:   on admission  Stool:       none reported  Skin/Edema:  Skin Integrity (WDL): Exceptions to WDL   Location: abd Skin Integrity: Other (Comment) (dressing)     Chewing / Swallowing:   no issues reported  Mental Status / Barriers:  no issues reported    Food/Nutrition-Related History  Pre-Admission / Home Diet:  Pre-Admission/Home Diet: General   Home Supplements / Herbals:    none noted  Food Restrictions / Cultural Requests:    none noted  Allergies as of 08/05/2013 - Review Complete 08/05/2013   Allergen Reaction Noted   ??? Talwin [pentazocine] Other (See Comments) 03/29/2013       PO Diet Orders  Current diet order: DIET GI SOFT;  Dietary Nutrition Supplements: Standard Oral Supplement  PN-Adult Premix 5/20 - Standard Electrolytes - Central Line  Feeding Assistance:        Feeding: Able to feed self    Room Service: selective  Nursing Recorded PO Intake: PO Meals Eaten (%): 26 - 50%   Patient / Family Reported PO Intake: eating breakfast this AM; feeling hungry  Observed PO Intake: eating eggs and hashbrowns   Pain and PO intake: n/a.    Tube Feeding Order / Parenteral Order  PN goal 166 ml/hr x 12 hours w/ 1 hr x 50 cc/hr taper up and down providing 1753 kcal, 99.6 g protein      Intake/Output Summary (Last 24 hours) at 08/11/13 1517  Last data filed at 08/11/13 1315   Gross per 24 hour   Intake    240 ml   Output      0 ml   Net    240 ml       Intake vs. Needs: meets needs w/ PN    NUTRITION DIAGNOSIS and GOAL  Nutrition Deficit Risk.   ?? Problem: adequate nutrient intake  ?? Etiology/related to: n/a  ?? Symptoms/Signs/as evidenced by: n/a    Goal: pt will tolerate adv of  diet and consume greater than 50% of meals.   Initiated: 9/1     Progress: n/a     Resolved: n/a    EDUCATION  n/a  MALNUTRITION  Per AND/ASPEN guidelines, patient does not meet criteria for malnutrition, will monitor for additional RD, RN and MD documentation re: weight status, nutritional intake, fluid accumulation, possible loss of body fat or muscle mass, or possible reduced grip strength.    INTERVENTION HISTORY  9/1 initial assessment    NUTRITION PRESCRIPTION / RECOMMENDATIONS / MONITORING / EVALUATION   1. Continue current diet and encourage PO intake.   2. Continue PN at goal rate 166 ml/hr x 10 hours with 1 hr x 50 cc/hr taper up and down      Pt is at moderate- high nutritional risk. Will follow up in 3-5 days to monitor nutrition.  Consult dietitian if intervention is needed  before that time.    Vella Kohler RD, LD  Pager [phone]

## 2013-08-11 NOTE — Progress Notes (Signed)
PRN phenergan IV med administered for nausea.

## 2013-08-11 NOTE — Progress Notes (Signed)
This RN rounded on pt. PRN phenergan given for nausea. Pt awake in bed visiting with her family. Denies any other needs. Call light in reach.

## 2013-08-11 NOTE — Progress Notes (Signed)
Rachael Mays is a 57 y.o. female patient.    Current Facility-Administered Medications   Medication Dose Route Frequency Provider Last Rate Last Dose   ??? oxyCODONE-acetaminophen (ROXICET) 5-325 MG/5ML solution 10 mL  10 mL Oral Q4H PRN Galen Manila, MD       ??? pantoprazole (PROTONIX) injection 40 mg  40 mg Intravenous BID Durene Romans O'Toole Jr., DO   40 mg at 08/10/13 2142   ??? sucralfate (CARAFATE) 1 GM/10ML suspension 1 g  1 g Oral Q6H [facility] Terrance Milda Smart., DO   1 g at 08/11/13 1610   ??? insulin lispro (HUMALOG) injection pen 0-6 Units  0-6 Units Subcutaneous Q6H Lum Keas, MD   1 Units at 08/11/13 9604   ??? albuterol (PROVENTIL HFA;VENTOLIN HFA) 108 (90 BASE) MCG/ACT inhaler 2 puff  2 puff Inhalation Q6H PRN Lum Keas, MD       ??? ALPRAZolam Prudy Feeler) tablet 0.5 mg  0.5 mg Oral BID PRN Lum Keas, MD   0.5 mg at 08/10/13 1645   ??? cetirizine (ZYRTEC) tablet 10 mg  10 mg Oral Daily Lum Keas, MD   10 mg at 08/10/13 5409   ??? cloNIDine (CATAPRES) 0.1 MG/24HR 1 patch  1 patch Transdermal Weekly Lum Keas, MD   1 patch at 08/06/13 [phone]   ??? citalopram (CELEXA) tablet 40 mg  40 mg Oral Daily Lum Keas, MD   40 mg at 08/10/13 1478   ??? gabapentin (NEURONTIN) capsule 300 mg  300 mg Oral Nightly Lum Keas, MD   300 mg at 08/10/13 2141   ??? montelukast (SINGULAIR) tablet 10 mg  10 mg Oral Daily Lum Keas, MD   10 mg at 08/10/13 0857   ??? scopolamine (TRANSDERM-SCOP) 1.5 MG 1 patch  1 patch Transdermal Q72H Lum Keas, MD   1 patch at 08/08/13 0831   ??? sodium chloride flush 0.9 % injection 10 mL  10 mL Intravenous Q12H [facility] Lum Keas, MD   10 mL at 08/10/13 2142   ??? sodium chloride flush 0.9 % injection 10 mL  10 mL Intravenous PRN Lum Keas, MD   10 mL at 08/08/13 2342   ??? acetaminophen (TYLENOL) tablet 650 mg  650 mg Oral Q4H PRN Lum Keas, MD       ???  ondansetron [facility]) injection 4 mg  4 mg Intravenous Q6H PRN Lum Keas, MD   4 mg at 08/11/13 0447   ??? promethazine (PHENERGAN) injection 25 mg  25 mg Intravenous Q6H PRN Lum Keas, MD   25 mg at 08/11/13 2956   ??? HYDROmorphone HCl PF (DILAUDID) injection SOLN 1 mg  1 mg Intravenous Q4H PRN Lum Keas, MD   1 mg at 08/11/13 0329   ??? enoxaparin (LOVENOX) injection 40 mg  40 mg Subcutaneous Nightly Lum Keas, MD   40 mg at 08/10/13 2142     Allergies   Allergen Reactions   ??? Talwin [Pentazocine] Other (See Comments)     dizzy     Active Problems:    * No active hospital problems. *    Blood pressure 117/71, pulse 69, temperature 98.6 ??F (37 ??C), temperature source Oral, resp. rate 16, height 5\' 6"  (1.676 m), weight 215 lb (97.523 kg), SpO2 96 %.    Subjective:  Symptoms:  (  Pt's procedure was 3 weeks ago today. She feels she is not doing any better and actually little worse. States the pain is worse; nausea persists; vomited again this AM.  Very frustrated.).      Objective:  General Appearance:  Uncomfortable.    Vital signs: (most recent): Blood pressure 117/71, pulse 69, temperature 98.6 ??F (37 ??C), temperature source Oral, resp. rate 16, height 5\' 6"  (1.676 m), weight 215 lb (97.523 kg), SpO2 96 %.  Vital signs are normal.  No fever.    Output: Producing urine and producing stool.    HEENT: Normal HEENT exam.    Lungs:  Normal effort.    Heart: Normal rate.    Extremities: There is no dependent edema.    Abdomen: Abdomen is soft.  There is epigastric (tenderness s guarding) tenderness.    Pulses: Distal pulses are intact.      Assessment:  (Cont abd pain, nausea, vomiting.  Will discuss c GI and surgery).       Guss Bunde, MD  08/11/2013

## 2013-08-11 NOTE — Progress Notes (Signed)
This RN rounded on pt. Pt awake in bed watching tv. Prn dilaudid given for pain. Denies any other needs. Call light in reach.

## 2013-08-11 NOTE — Progress Notes (Signed)
Bedside handoff report complete with Marylene Land, RN. Pt in bed, denies any further needs.

## 2013-08-11 NOTE — Progress Notes (Signed)
Bedside handoff and report completed with Britta Mccreedy, RN

## 2013-08-11 NOTE — Progress Notes (Signed)
Patient still with complaitnts of pain and n/v.  Seems very comfortable again today.  Has been drinking sprite today without problem, however, I stressed to her to avoid carbonated beverages with a stent.    Filed Vitals:    08/11/13 0718   BP: 117/71   Pulse: 69   Temp: 98.6 ??F (37 ??C)   Resp: 16     Abd: soft nt/nd  Wound healing well      AP:  I agree completely with Dr. Tressa Busman plan.  She should attempt to keep the stent as long as tolerated to increase likelihood of healing.  If she can not tolerate as an outpatient, I agree to have GI remove the stent.    I was clear with the patient today, if the leak does not heal with the stent, she will likely require a total gastrectomy to repair defect which would not be an optimal option for her.    No other recommendations at this time  Will follow

## 2013-08-11 NOTE — Progress Notes (Signed)
PRN Phenergan given for nausea.  Routine VSS.  No other needs.  Call light and bedside table within reach.

## 2013-08-12 LAB — POCT GLUCOSE
POC Glucose: 126 mg/dl — ABNORMAL HIGH (ref 70–99)
POC Glucose: 104 mg/dl — ABNORMAL HIGH (ref 70–99)
POC Glucose: 97 mg/dl (ref 70–99)
POC Glucose: 85 mg/dl (ref 70–99)

## 2013-08-12 LAB — CBC WITH AUTO DIFFERENTIAL
Basophils %: 0.5 %
Basophils Absolute: 0 10*3/uL (ref 0.0–0.2)
Eosinophils %: 6 %
Eosinophils Absolute: 0.5 10*3/uL (ref 0.0–0.6)
Hematocrit: 34.7 % — ABNORMAL LOW (ref 36.0–48.0)
Hemoglobin: 11.3 g/dL — ABNORMAL LOW (ref 12.0–16.0)
Lymphocytes %: 25.4 %
Lymphocytes Absolute: 2 10*3/uL (ref 1.0–5.1)
MCH: 27.6 pg (ref 26.0–34.0)
MCHC: 32.6 g/dL (ref 31.0–36.0)
MCV: 84.5 fL (ref 80.0–100.0)
MPV: 7.9 fL (ref 5.0–10.5)
Monocytes %: 10.5 %
Monocytes Absolute: 0.8 10*3/uL (ref 0.0–1.3)
Neutrophils %: 57.6 %
Neutrophils Absolute: 4.4 10*3/uL (ref 1.7–7.7)
Platelets: 244 10*3/uL (ref 135–450)
RBC: 4.11 M/uL (ref 4.00–5.20)
RDW: 13.8 % (ref 12.4–15.4)
WBC: 7.7 10*3/uL (ref 4.0–11.0)

## 2013-08-12 LAB — BASIC METABOLIC PANEL
BUN: 16 mg/dL (ref 7–20)
CO2: 30 mmol/L (ref 21–32)
Calcium: 9.1 mg/dL (ref 8.3–10.6)
Chloride: 98 mmol/L — ABNORMAL LOW (ref 99–110)
Creatinine: 0.7 mg/dL (ref 0.6–1.1)
GFR African American: >60 (ref >60)
GFR Non-African American: >60 (ref >60)
Glucose: 111 mg/dL — ABNORMAL HIGH (ref 70–99)
Potassium: 4.5 mmol/L (ref 3.5–5.1)
Sodium: 134 mmol/L — ABNORMAL LOW (ref 136–145)

## 2013-08-12 LAB — MAGNESIUM: Magnesium: 2 mg/dL (ref 1.80–2.40)

## 2013-08-12 LAB — PHOSPHORUS: Phosphorus: 3.8 mg/dL (ref 2.5–4.9)

## 2013-08-12 MED ADMIN — sucralfate (CARAFATE) 1 GM/10ML suspension 1 g: ORAL | @ 11:00:00 | NDC 00121074710

## 2013-08-12 MED ADMIN — alteplase (CATHFLO) injection 2 mg: @ 14:00:00 | NDC 50242004164

## 2013-08-12 MED ADMIN — citalopram (CELEXA) tablet 40 mg: ORAL | @ 14:00:00 | NDC 68084074411

## 2013-08-12 MED ADMIN — gabapentin (NEURONTIN) capsule 300 mg: ORAL | @ 02:00:00 | NDC 53746010201

## 2013-08-12 MED ADMIN — sucralfate (CARAFATE) 1 GM/10ML suspension 1 g: ORAL | @ 17:00:00 | NDC 00121074710

## 2013-08-12 MED ADMIN — ondansetron (ZOFRAN) injection 4 mg: INTRAVENOUS | @ 17:00:00 | NDC 23155037831

## 2013-08-12 MED ADMIN — sodium chloride flush 0.9 % injection 10 mL: INTRAVENOUS | @ 02:00:00

## 2013-08-12 MED ADMIN — sodium chloride flush 0.9 % injection 10 mL: INTRAVENOUS | @ 14:00:00

## 2013-08-12 MED ADMIN — ondansetron (ZOFRAN) injection 4 mg: INTRAVENOUS | @ 07:00:00 | NDC 23155037831

## 2013-08-12 MED ADMIN — sodium chloride flush 0.9 % injection 10 mL: INTRAVENOUS | @ 12:00:00

## 2013-08-12 MED ADMIN — sodium chloride flush 0.9 % injection 10 mL: INTRAVENOUS | @ 17:00:00

## 2013-08-12 MED ADMIN — promethazine (PHENERGAN) injection 25 mg: INTRAVENOUS | @ 21:00:00 | NDC 00641092821

## 2013-08-12 MED ADMIN — pantoprazole (PROTONIX) injection 40 mg: INTRAVENOUS | @ 02:00:00 | NDC 00008400101

## 2013-08-12 MED ADMIN — sodium chloride flush 0.9 % injection 10 mL: INTRAVENOUS | @ 21:00:00

## 2013-08-12 MED ADMIN — ondansetron (ZOFRAN) injection 4 mg: INTRAVENOUS | @ 23:00:00 | NDC 23155037831

## 2013-08-12 MED ADMIN — cetirizine (ZYRTEC) tablet 10 mg: ORAL | @ 14:00:00 | NDC 51079059701

## 2013-08-12 MED ADMIN — enoxaparin (LOVENOX) injection 40 mg: SUBCUTANEOUS | @ 02:00:00 | NDC 00075801401

## 2013-08-12 MED ADMIN — montelukast (SINGULAIR) tablet 10 mg: ORAL | @ 14:00:00 | NDC 68084062011

## 2013-08-12 MED ADMIN — sucralfate (CARAFATE) 1 GM/10ML suspension 1 g: ORAL | @ 04:00:00 | NDC 00121074710

## 2013-08-12 MED ADMIN — promethazine (PHENERGAN) injection 25 mg: INTRAVENOUS | @ 12:00:00 | NDC 00641092821

## 2013-08-12 MED ADMIN — PN-Adult Premix 5/20 - Standard Electrolytes - Central Line: INTRAVENOUS | @ 23:00:00 | NDC 00338112504

## 2013-08-12 MED ADMIN — ondansetron (ZOFRAN) injection 4 mg: INTRAVENOUS | @ 11:00:00 | NDC 23155037831

## 2013-08-12 MED ADMIN — sodium chloride flush 0.9 % injection 10 mL: INTRAVENOUS | @ 19:00:00

## 2013-08-12 MED ADMIN — sucralfate (CARAFATE) 1 GM/10ML suspension 1 g: ORAL | @ 23:00:00 | NDC 00121074710

## 2013-08-12 MED ADMIN — promethazine (PHENERGAN) injection 25 mg: INTRAVENOUS | @ 04:00:00 | NDC 00641092821

## 2013-08-12 MED ADMIN — pantoprazole (PROTONIX) injection 40 mg: INTRAVENOUS | @ 14:00:00 | NDC 00008400101

## 2013-08-12 MED FILL — ONDANSETRON HCL 4 MG/2ML IJ SOLN: 4 MG/2ML | INTRAMUSCULAR | Qty: 2

## 2013-08-12 MED FILL — NORMAL SALINE FLUSH 0.9 % IV SOLN: 0.9 % | INTRAVENOUS | Qty: 10

## 2013-08-12 MED FILL — PROMETHAZINE HCL 25 MG/ML IJ SOLN: 25 MG/ML | INTRAMUSCULAR | Qty: 1

## 2013-08-12 MED FILL — HYDROMORPHONE HCL PF 1 MG/ML IJ SOLN: 1 MG/ML | INTRAMUSCULAR | Qty: 1

## 2013-08-12 MED FILL — CATHFLO ACTIVASE 2 MG IJ SOLR: 2 MG | INTRAMUSCULAR | Qty: 2

## 2013-08-12 MED FILL — SUCRALFATE 1 GM/10ML PO SUSP: 1 GM/0ML | ORAL | Qty: 10

## 2013-08-12 MED FILL — NORMAL SALINE FLUSH 0.9 % IV SOLN: 0.9 % | INTRAVENOUS | Qty: 20

## 2013-08-12 MED FILL — CETIRIZINE HCL 10 MG PO TABS: 10 MG | ORAL | Qty: 1

## 2013-08-12 MED FILL — CLINIMIX E/DEXTROSE (5/20) 5 % IV SOLN: 5 % | INTRAVENOUS | Qty: 2000

## 2013-08-12 MED FILL — PROTONIX 40 MG IV SOLR: 40 MG | INTRAVENOUS | Qty: 40

## 2013-08-12 MED FILL — CITALOPRAM HYDROBROMIDE 20 MG PO TABS: 20 MG | ORAL | Qty: 2

## 2013-08-12 MED FILL — SINGULAIR 10 MG PO TABS: 10 MG | ORAL | Qty: 1

## 2013-08-12 MED FILL — GABAPENTIN 300 MG PO CAPS: 300 MG | ORAL | Qty: 1

## 2013-08-12 MED FILL — NORMAL SALINE FLUSH 0.9 % IV SOLN: 0.9 % | INTRAVENOUS | Qty: 50

## 2013-08-12 MED FILL — LOVENOX 40 MG/0.4ML SC SOLN: 40 MG/0.4ML | SUBCUTANEOUS | Qty: 0.4

## 2013-08-12 NOTE — Plan of Care (Signed)
Problem: Falls - Risk of  Goal: Absence of falls  Pt remains free from falls at this time.

## 2013-08-12 NOTE — Progress Notes (Signed)
Nurse retracted cath-flo from PICC line, no blood return noted. Will call MD    Zada Girt

## 2013-08-12 NOTE — Progress Notes (Signed)
Pt medicated for pain, see mar. Blood return now noted with both lumens of PICC line. Second dose of cathflo not given    Zada Girt

## 2013-08-12 NOTE — Progress Notes (Signed)
cathflo started in red lumen    Zada Girt

## 2013-08-12 NOTE — Progress Notes (Signed)
Cath flo checked, sill no blood return noted. Will continue to monitor    Rachael Mays

## 2013-08-12 NOTE — Progress Notes (Signed)
Assessment complete. VSS. Pt resting in bed resting in bed respirations even and easy. Call light in reach. No needs expressed at this time.

## 2013-08-12 NOTE — Progress Notes (Signed)
Spoke with Dr. Mack Hook regarding patient's PICC line; see new orders for Cathflow.

## 2013-08-12 NOTE — Progress Notes (Signed)
Called lab to come and draw morning blood work peripherally.

## 2013-08-12 NOTE — Progress Notes (Signed)
Pt assessment complete. Pt nauseated at this time, requested phenergan, given, see mar. TPN stopped per order. PICC flushing well. Pt in bed, call light within reach. Denies any needs at this time.    Rachael Mays

## 2013-08-12 NOTE — Progress Notes (Signed)
Bedside handoff and report completed with Thurston Hole, RN.  Patient awake in bed; denies needs at this time.  Call light and bedside table within reach.

## 2013-08-12 NOTE — Progress Notes (Signed)
Pt given am medications, see mar. Pt medicated for pain, see mar    Rachael Mays

## 2013-08-12 NOTE — Progress Notes (Signed)
No blood return from either Lumen of PICC line.  Call placed to MD to get Cath-flow order.

## 2013-08-12 NOTE — Progress Notes (Signed)
Call placed to MD to receive another order for cath flo. Pt given zofran for nausea, see mar. Pt denies any other needs at this time    Rachael Mays

## 2013-08-12 NOTE — Progress Notes (Signed)
S:  Continues to have burning pain in epigastric region; clears tolerated yesterday; emesis this morning brown (ate chocolate covered fruit yesterday)    O:  BP 111/60   Pulse 66   Temp(Src) 98.6 ??F (37 ??C) (Oral)   Resp 16   Ht 5\' 6"  (1.676 m)   Wt 215 lb (97.523 kg)   BMI 34.72 kg/m2   SpO2 98%  gen-alert, no distress  cvs-rrr, no murmur  resp-cta bilaterally  abd-+bs, soft, nt, nd    Lab Results   Component Value Date    CREATININE 0.7 08/12/2013    BUN 16 08/12/2013    NA 134* 08/12/2013    K 4.5 08/12/2013    CL 98* 08/12/2013    CO2 30 08/12/2013     Lab Results   Component Value Date    WBC 7.7 08/12/2013    HGB 11.3* 08/12/2013    HCT 34.7* 08/12/2013    MCV 84.5 08/12/2013    PLT 244 08/12/2013     A/P:  57 y.o. female  1.  Abdominal pain and nausea-continue pain control, TPN, and clears as tolerated; surgery and GI recs appreciated

## 2013-08-12 NOTE — Progress Notes (Signed)
Visual rounding completed.  Patient asleep in bed with no signs of distress.  Breathing even and unlabored.  Will continue to monitor. Call light and bedside table in reach.

## 2013-08-12 NOTE — Progress Notes (Signed)
[facility]  Gastroenterology Progress Note  Rachael Mays is a 57 y.o. female patient.  1. Intractable vomiting    2. Intractable abdominal pain        SUBJECTIVE:  Says the epigastric burning is about the same, maybe a little worse.  tol some po.     Physical    VITALS:  BP 100/64   Pulse 72   Temp(Src) 98.8 ??F (37.1 ??C) (Oral)   Resp 16   Ht 5\' 6"  (1.676 m)   Wt 215 lb (97.523 kg)   BMI 34.72 kg/m2   SpO2 95%  TEMPERATURE:  Current - Temp: 98.8 ??F (37.1 ??C); Max - Temp  Avg: 98.5 ??F (36.9 ??C)  Min: 98.1 ??F (36.7 ??C)  Max: 98.8 ??F (37.1 ??C)    Abdomen soft, ND, mild discomfort with palpation epig.  No r/g. , no HSM, Bowel sounds normal   aaox 3 anicteric no jaundice.   Cta bilat nl effort.     Data      Recent Labs      08/10/13   0405  08/11/13   0550  08/12/13   0657   WBC  7.8  7.1  7.7   HGB  11.2*  10.8*  11.3*   HCT  33.5*  33.2*  34.7*   MCV  83.7  84.9  84.5   PLT  211  226  244     Recent Labs      08/10/13   0405  08/11/13   0550  08/12/13   0657   NA  134*  137  134*   K  4.0  4.2  4.5   CL  101  101  98*   CO2  27  28  30    PHOS   --   3.4  3.8   BUN  21*  19  16   CREATININE  0.6  0.8  0.7         ASSESSMENT   1. Gastric leak s/p lap band removal and endoscopic stent placement.  Still with burning discomfort likely from the stent.  D/w her as the others have, can d/w Dr. Daphine Deutscher upon his return if she wishes stent to be removed.       Cont current mgt in the interim.     Kellie Moor, MD  [facility]

## 2013-08-12 NOTE — Progress Notes (Addendum)
Pt bathed by her daughters. Pt medicated for pain and nausea, see mar. Dressing to abd changed due to drainage, packing not changed.     Rachael Mays

## 2013-08-12 NOTE — Progress Notes (Signed)
Order received to try cathflo again on PICC line    Zada Girt

## 2013-08-12 NOTE — Progress Notes (Signed)
Bedside handoff completed with  Anne RN. Pt resting in bed respirations even and easy. Call light in reach. No needs expressed at this time.

## 2013-08-12 NOTE — Progress Notes (Signed)
Pt medicated for nausea with phenergan, see mar    Zada Girt

## 2013-08-12 NOTE — Progress Notes (Signed)
PRN zofran given for nausea and PRN Diladid given for pain rated at 9/10.  No 0400 insulin given, as blood sugar was 126.

## 2013-08-12 NOTE — Progress Notes (Signed)
Morning medication given and PRN Zofran for nausea.

## 2013-08-13 LAB — POCT GLUCOSE
POC Glucose: 134 mg/dl — ABNORMAL HIGH (ref 70–99)
POC Glucose: 160 mg/dl — ABNORMAL HIGH (ref 70–99)
POC Glucose: 118 mg/dl — ABNORMAL HIGH (ref 70–99)
POC Glucose: 94 mg/dl (ref 70–99)
POC Glucose: 81 mg/dl (ref 70–99)
POC Glucose: 85 mg/dl (ref 70–99)

## 2013-08-13 LAB — CBC WITH AUTO DIFFERENTIAL
Basophils %: 1.3 %
Basophils Absolute: 0.1 10*3/uL (ref 0.0–0.2)
Eosinophils %: 5.7 %
Eosinophils Absolute: 0.4 10*3/uL (ref 0.0–0.6)
Hematocrit: 33.1 % — ABNORMAL LOW (ref 36.0–48.0)
Hemoglobin: 10.9 g/dL — ABNORMAL LOW (ref 12.0–16.0)
Lymphocytes %: 25.5 %
Lymphocytes Absolute: 1.8 10*3/uL (ref 1.0–5.1)
MCH: 27.6 pg (ref 26.0–34.0)
MCHC: 32.9 g/dL (ref 31.0–36.0)
MCV: 83.9 fL (ref 80.0–100.0)
MPV: 8.1 fL (ref 5.0–10.5)
Monocytes %: 10.3 %
Monocytes Absolute: 0.7 10*3/uL (ref 0.0–1.3)
Neutrophils %: 57.2 %
Neutrophils Absolute: 4.1 10*3/uL (ref 1.7–7.7)
Platelets: 228 10*3/uL (ref 135–450)
RBC: 3.94 M/uL — ABNORMAL LOW (ref 4.00–5.20)
RDW: 13.8 % (ref 12.4–15.4)
WBC: 7.1 10*3/uL (ref 4.0–11.0)

## 2013-08-13 LAB — BASIC METABOLIC PANEL
BUN: 19 mg/dL (ref 7–20)
CO2: 27 mmol/L (ref 21–32)
Calcium: 8.7 mg/dL (ref 8.3–10.6)
Chloride: 102 mmol/L (ref 99–110)
Creatinine: 0.7 mg/dL (ref 0.6–1.1)
GFR African American: >60 (ref >60)
GFR Non-African American: >60 (ref >60)
Glucose: 145 mg/dL — ABNORMAL HIGH (ref 70–99)
Potassium: 4 mmol/L (ref 3.5–5.1)
Sodium: 137 mmol/L (ref 136–145)

## 2013-08-13 LAB — MAGNESIUM: Magnesium: 2 mg/dL (ref 1.80–2.40)

## 2013-08-13 LAB — PHOSPHORUS: Phosphorus: 3.7 mg/dL (ref 2.5–4.9)

## 2013-08-13 MED ADMIN — promethazine (PHENERGAN) injection 25 mg: INTRAVENOUS | @ 05:00:00 | NDC 00641092821

## 2013-08-13 MED ADMIN — PN-Adult Premix 5/20 - Standard Electrolytes - Central Line: INTRAVENOUS | @ 22:00:00 | NDC 00338112504

## 2013-08-13 MED ADMIN — montelukast (SINGULAIR) tablet 10 mg: ORAL | @ 13:00:00 | NDC 68084062011

## 2013-08-13 MED ADMIN — sucralfate (CARAFATE) 1 GM/10ML suspension 1 g: ORAL | @ 21:00:00 | NDC 00121074710

## 2013-08-13 MED ADMIN — promethazine (PHENERGAN) injection 25 mg: INTRAVENOUS | @ 13:00:00 | NDC 00641092821

## 2013-08-13 MED ADMIN — sucralfate (CARAFATE) 1 GM/10ML suspension 1 g: ORAL | @ 17:00:00 | NDC 66689079001

## 2013-08-13 MED ADMIN — sodium chloride flush 0.9 % injection: @ 13:00:00

## 2013-08-13 MED ADMIN — pantoprazole (PROTONIX) injection 40 mg: INTRAVENOUS | @ 13:00:00 | NDC 00008400101

## 2013-08-13 MED ADMIN — ondansetron (ZOFRAN) injection 4 mg: INTRAVENOUS | @ 08:00:00 | NDC 23155037831

## 2013-08-13 MED ADMIN — promethazine (PHENERGAN) injection 25 mg: INTRAVENOUS | @ 20:00:00 | NDC 00641092821

## 2013-08-13 MED ADMIN — sucralfate (CARAFATE) 1 GM/10ML suspension 1 g: ORAL | @ 10:00:00 | NDC 68094017159

## 2013-08-13 MED ADMIN — ondansetron (ZOFRAN) injection 4 mg: INTRAVENOUS | @ 23:00:00 | NDC 23155037831

## 2013-08-13 MED ADMIN — sodium chloride flush 0.9 % injection 10 mL: INTRAVENOUS | @ 01:00:00

## 2013-08-13 MED ADMIN — gabapentin (NEURONTIN) capsule 300 mg: ORAL | @ 01:00:00 | NDC 68084056311

## 2013-08-13 MED ADMIN — cetirizine (ZYRTEC) tablet 10 mg: ORAL | @ 13:00:00 | NDC 51079059701

## 2013-08-13 MED ADMIN — enoxaparin (LOVENOX) injection 40 mg: SUBCUTANEOUS | @ 01:00:00 | NDC 00075801401

## 2013-08-13 MED ADMIN — sucralfate (CARAFATE) 1 GM/10ML suspension 1 g: ORAL | @ 04:00:00 | NDC 68094017159

## 2013-08-13 MED ADMIN — cloNIDine (CATAPRES) 0.1 MG/24HR 1 patch: TRANSDERMAL | @ 14:00:00 | NDC 00555100916

## 2013-08-13 MED ADMIN — citalopram (CELEXA) tablet 40 mg: ORAL | @ 13:00:00 | NDC 68084074411

## 2013-08-13 MED ADMIN — pantoprazole (PROTONIX) injection 40 mg: INTRAVENOUS | @ 01:00:00 | NDC 00008400101

## 2013-08-13 MED ADMIN — sodium chloride flush 0.9 % injection 10 mL: INTRAVENOUS | @ 13:00:00

## 2013-08-13 MED FILL — NORMAL SALINE FLUSH 0.9 % IV SOLN: 0.9 % | INTRAVENOUS | Qty: 10

## 2013-08-13 MED FILL — LOVENOX 40 MG/0.4ML SC SOLN: 40 MG/0.4ML | SUBCUTANEOUS | Qty: 0.4

## 2013-08-13 MED FILL — HYDROMORPHONE HCL PF 1 MG/ML IJ SOLN: 1 MG/ML | INTRAMUSCULAR | Qty: 1

## 2013-08-13 MED FILL — PROTONIX 40 MG IV SOLR: 40 MG | INTRAVENOUS | Qty: 40

## 2013-08-13 MED FILL — ONDANSETRON HCL 4 MG/2ML IJ SOLN: 4 MG/2ML | INTRAMUSCULAR | Qty: 2

## 2013-08-13 MED FILL — SUCRALFATE 1 GM/10ML PO SUSP: 1 GM/0ML | ORAL | Qty: 10

## 2013-08-13 MED FILL — PROMETHAZINE HCL 25 MG/ML IJ SOLN: 25 MG/ML | INTRAMUSCULAR | Qty: 1

## 2013-08-13 MED FILL — CLONIDINE HCL 0.1 MG/24HR TD PTWK: 0.1 MG/24HR | TRANSDERMAL | Qty: 1

## 2013-08-13 MED FILL — CITALOPRAM HYDROBROMIDE 20 MG PO TABS: 20 MG | ORAL | Qty: 2

## 2013-08-13 MED FILL — SINGULAIR 10 MG PO TABS: 10 MG | ORAL | Qty: 1

## 2013-08-13 MED FILL — CETIRIZINE HCL 10 MG PO TABS: 10 MG | ORAL | Qty: 1

## 2013-08-13 MED FILL — GABAPENTIN 300 MG PO CAPS: 300 MG | ORAL | Qty: 1

## 2013-08-13 MED FILL — NORMAL SALINE FLUSH 0.9 % IV SOLN: 0.9 % | INTRAVENOUS | Qty: 30

## 2013-08-13 MED FILL — CLINIMIX E/DEXTROSE (5/20) 5 % IV SOLN: 5 % | INTRAVENOUS | Qty: 2000

## 2013-08-13 MED FILL — NORMAL SALINE FLUSH 0.9 % IV SOLN: 0.9 % | INTRAVENOUS | Qty: 50

## 2013-08-13 NOTE — Progress Notes (Signed)
Shift assessment complete. VSS. Lung sounds CTA. Abdomen soft, bowels active. Applied new dressing and packing to surgical wound on lower, left abdomen. Denies any other needs at this time. Bed alarm engaged. Call light within reach.

## 2013-08-13 NOTE — Progress Notes (Signed)
Pt complain of pain PRN dilaudid given see MAR

## 2013-08-13 NOTE — Progress Notes (Signed)
Medicated for pain with dilaudid, see eMAR. Elwyn Lade 12:40 PM

## 2013-08-13 NOTE — Progress Notes (Signed)
Pt complain of pain and nausea PRN dilaudid and zofran given see MAR

## 2013-08-13 NOTE — Progress Notes (Signed)
Bedside handoff completed with  Erin RN.

## 2013-08-13 NOTE — Plan of Care (Signed)
Problem: Falls - Risk of  Goal: Absence of falls  Outcome: Ongoing  Patient free of falls at this time, able to ambulate independently, will call for assistance if needed    Problem: Nausea/Vomiting  Goal: Absence of nausea/vomiting  Outcome: Ongoing  Patient continues to have nausea, patient is now asleep, will continue to monitor and medicate PRN

## 2013-08-13 NOTE — Progress Notes (Signed)
Report and bedside handoff with Denny Peon, Rn complete.

## 2013-08-13 NOTE — Progress Notes (Signed)
Patient given dilaudid per request, see eMAR, denies further needs. Elwyn Lade 5:14 PM

## 2013-08-13 NOTE — Progress Notes (Signed)
Rounded on patient, vitals stable, given phenergen per request. Elwyn Lade 3:39 PM

## 2013-08-13 NOTE — Progress Notes (Signed)
Morning assessment and med pass complete. Patient given phenergen for nausea, see eMAR. Page sent out to MD to clarify dressing changes. Elwyn Lade 9:12 AM

## 2013-08-13 NOTE — Progress Notes (Signed)
Spoke with Dr. Rozanna Boer regarding dressing changes. He said that if it is draining a lot, we should do aquacel dressings once a day. If it is not draining a lot, do wet to dry dressings BID. This RN assessed wound, measures 2cm x .5cm x 1.3 cm deep. This RN cleansed wound with saline, performed wet to dry dressing. Elwyn Lade 10:44 AM

## 2013-08-13 NOTE — Progress Notes (Signed)
Report and bedside handoff complete with Luther Parody and [place] RNs. Elwyn Lade 7:20 PM

## 2013-08-13 NOTE — Progress Notes (Signed)
Patient with complaint of pain, 10/10. Medicated as per order, see MAR. Up to bathroom, voided at this time.Rachael Mays

## 2013-08-13 NOTE — Progress Notes (Signed)
Pt still on the fence about stent removal.  Will d/w Dr. Daphine Deutscher.     Kellie Moor, MD  [facility]

## 2013-08-13 NOTE — Progress Notes (Signed)
TPN increased to 149mL/hr per orders, given zofran per request. Elwyn Lade 6:57 PM

## 2013-08-13 NOTE — Progress Notes (Signed)
Rachael Mays is a 57 y.o. female patient.    Current Facility-Administered Medications   Medication Dose Route Frequency Provider Last Rate Last Dose   ??? alteplase (CATHFLO) injection 2 mg  2 mg Intracatheter Once Guss Bunde, MD       ??? oxyCODONE-acetaminophen (ROXICET) 5-325 MG/5ML solution 10 mL  10 mL Oral Q4H PRN Galen Manila, MD       ??? pantoprazole (PROTONIX) injection 40 mg  40 mg Intravenous BID Durene Romans O'Toole Jr., DO   40 mg at 08/12/13 2053   ??? sucralfate (CARAFATE) 1 GM/10ML suspension 1 g  1 g Oral Q6H [facility] Terrance Milda Smart., DO   1 g at 08/13/13 0545   ??? insulin lispro (HUMALOG) injection pen 0-6 Units  0-6 Units Subcutaneous Q6H Lum Keas, MD   1 Units at 08/11/13 1610   ??? albuterol (PROVENTIL HFA;VENTOLIN HFA) 108 (90 BASE) MCG/ACT inhaler 2 puff  2 puff Inhalation Q6H PRN Lum Keas, MD       ??? ALPRAZolam Prudy Feeler) tablet 0.5 mg  0.5 mg Oral BID PRN Lum Keas, MD   0.5 mg at 08/10/13 1645   ??? cetirizine (ZYRTEC) tablet 10 mg  10 mg Oral Daily Lum Keas, MD   10 mg at 08/12/13 9604   ??? cloNIDine (CATAPRES) 0.1 MG/24HR 1 patch  1 patch Transdermal Weekly Lum Keas, MD   1 patch at 08/06/13 [phone]   ??? citalopram (CELEXA) tablet 40 mg  40 mg Oral Daily Lum Keas, MD   40 mg at 08/12/13 8119   ??? gabapentin (NEURONTIN) capsule 300 mg  300 mg Oral Nightly Lum Keas, MD   300 mg at 08/12/13 2053   ??? montelukast (SINGULAIR) tablet 10 mg  10 mg Oral Daily Lum Keas, MD   10 mg at 08/12/13 1478   ??? scopolamine (TRANSDERM-SCOP) 1.5 MG 1 patch  1 patch Transdermal Q72H Lum Keas, MD   1 patch at 08/11/13 [phone]   ??? sodium chloride flush 0.9 % injection 10 mL  10 mL Intravenous Q12H [facility] Lum Keas, MD   10 mL at 08/12/13 2054   ??? sodium chloride flush 0.9 % injection 10 mL  10 mL Intravenous PRN Lum Keas, MD   10 mL at 08/12/13 1708   ??? acetaminophen  (TYLENOL) tablet 650 mg  650 mg Oral Q4H PRN Lum Keas, MD       ??? ondansetron [facility]) injection 4 mg  4 mg Intravenous Q6H PRN Lum Keas, MD   4 mg at 08/13/13 0402   ??? promethazine (PHENERGAN) injection 25 mg  25 mg Intravenous Q6H PRN Lum Keas, MD   25 mg at 08/13/13 0124   ??? HYDROmorphone HCl PF (DILAUDID) injection SOLN 1 mg  1 mg Intravenous Q4H PRN Lum Keas, MD   1 mg at 08/13/13 0402   ??? enoxaparin (LOVENOX) injection 40 mg  40 mg Subcutaneous Nightly Lum Keas, MD   40 mg at 08/12/13 2053     Allergies   Allergen Reactions   ??? Talwin [Pentazocine] Other (See Comments)     dizzy     Active Problems:    * No active hospital problems. *    Blood pressure 117/67, pulse 79, temperature 98.6 ??F (37 ??C), temperature source Oral, resp. rate  16, height 5\' 6"  (1.676 m), weight 215 lb (97.523 kg), SpO2 99 %.    Subjective:  Symptoms:  (Cont to have pain, nausea, c interm vomiting.  Feels no different than she has for past week).      Objective  Assessment:  (Will see what Dr Daphine Deutscher plans this week. Pt anxious to have something done at this time since not improving c conservative tx.  Orders and labs reviewed.Nothing new to add.).       Guss Bunde, MD  08/13/2013

## 2013-08-13 NOTE — Progress Notes (Signed)
Scheduled medication given, see MAR. Pt c/o of 10/10 pain and nausea, PRN dilaudid and phenergan given. Pt denies any further needs. Call light within reach.

## 2013-08-13 NOTE — Progress Notes (Signed)
TPN hung at this time, going at 90mL/hr per orders for first hour. Elwyn Lade 5:52 PM

## 2013-08-13 NOTE — Progress Notes (Signed)
Report and bedside handoff complete with Norberta Keens. Patient asleep at this time, respirations even and unlabored. Elwyn Lade. 7:28 AM

## 2013-08-14 LAB — CBC WITH AUTO DIFFERENTIAL
Basophils %: 0.8 %
Basophils Absolute: 0.1 10*3/uL (ref 0.0–0.2)
Eosinophils %: 6.2 %
Eosinophils Absolute: 0.4 10*3/uL (ref 0.0–0.6)
Hematocrit: 35 % — ABNORMAL LOW (ref 36.0–48.0)
Hemoglobin: 11.6 g/dL — ABNORMAL LOW (ref 12.0–16.0)
Lymphocytes %: 29.7 %
Lymphocytes Absolute: 2.1 10*3/uL (ref 1.0–5.1)
MCH: 27.8 pg (ref 26.0–34.0)
MCHC: 33 g/dL (ref 31.0–36.0)
MCV: 84.1 fL (ref 80.0–100.0)
MPV: 8.2 fL (ref 5.0–10.5)
Monocytes %: 10.4 %
Monocytes Absolute: 0.7 10*3/uL (ref 0.0–1.3)
Neutrophils %: 52.9 %
Neutrophils Absolute: 3.8 10*3/uL (ref 1.7–7.7)
Platelets: 251 10*3/uL (ref 135–450)
RBC: 4.17 M/uL (ref 4.00–5.20)
RDW: 13.8 % (ref 12.4–15.4)
WBC: 7.2 10*3/uL (ref 4.0–11.0)

## 2013-08-14 LAB — BASIC METABOLIC PANEL
BUN: 19 mg/dL (ref 7–20)
CO2: 29 mmol/L (ref 21–32)
Calcium: 9.4 mg/dL (ref 8.3–10.6)
Chloride: 103 mmol/L (ref 99–110)
Creatinine: 0.7 mg/dL (ref 0.6–1.1)
GFR African American: >60 (ref >60)
GFR Non-African American: >60 (ref >60)
Glucose: 108 mg/dL — ABNORMAL HIGH (ref 70–99)
Potassium: 4.6 mmol/L (ref 3.5–5.1)
Sodium: 139 mmol/L (ref 136–145)

## 2013-08-14 LAB — POCT GLUCOSE
POC Glucose: 97 mg/dl (ref 70–99)
POC Glucose: 128 mg/dl — ABNORMAL HIGH (ref 70–99)
POC Glucose: 92 mg/dl (ref 70–99)
POC Glucose: 93 mg/dl (ref 70–99)
POC Glucose: 81 mg/dl (ref 70–99)

## 2013-08-14 LAB — PHOSPHORUS: Phosphorus: 3.8 mg/dL (ref 2.5–4.9)

## 2013-08-14 LAB — MAGNESIUM: Magnesium: 2.2 mg/dL (ref 1.80–2.40)

## 2013-08-14 MED ADMIN — sodium chloride flush 0.9 % injection 10 mL: INTRAVENOUS | @ 05:00:00

## 2013-08-14 MED ADMIN — sodium chloride flush 0.9 % injection 10 mL: INTRAVENOUS | @ 02:00:00

## 2013-08-14 MED ADMIN — ondansetron (ZOFRAN) injection 4 mg: INTRAVENOUS | @ 12:00:00 | NDC 23155037831

## 2013-08-14 MED ADMIN — sucralfate (CARAFATE) 1 GM/10ML suspension 1 g: ORAL | @ 17:00:00 | NDC 00121074710

## 2013-08-14 MED ADMIN — promethazine (PHENERGAN) injection 25 mg: INTRAVENOUS | @ 08:00:00 | NDC 00641092821

## 2013-08-14 MED ADMIN — sodium chloride flush 0.9 % injection 10 mL: INTRAVENOUS | @ 08:00:00

## 2013-08-14 MED ADMIN — pantoprazole (PROTONIX) injection 40 mg: INTRAVENOUS | @ 02:00:00 | NDC 00008400101

## 2013-08-14 MED ADMIN — pantoprazole (PROTONIX) injection 40 mg: INTRAVENOUS | @ 12:00:00 | NDC 00008400101

## 2013-08-14 MED ADMIN — PN-Adult Premix 5/20 - Standard Electrolytes - Central Line: INTRAVENOUS | @ 22:00:00 | NDC 63323014397

## 2013-08-14 MED ADMIN — promethazine (PHENERGAN) injection 25 mg: INTRAVENOUS | @ 02:00:00 | NDC 00641092821

## 2013-08-14 MED ADMIN — ondansetron (ZOFRAN) injection 4 mg: INTRAVENOUS | @ 05:00:00 | NDC 23155037831

## 2013-08-14 MED ADMIN — montelukast (SINGULAIR) tablet 10 mg: ORAL | @ 12:00:00 | NDC 68084062011

## 2013-08-14 MED ADMIN — enoxaparin (LOVENOX) injection 40 mg: SUBCUTANEOUS | @ 02:00:00 | NDC 00075801401

## 2013-08-14 MED ADMIN — scopolamine (TRANSDERM-SCOP) 1.5 MG 1 patch: TRANSDERMAL | @ 12:00:00 | NDC 10019055388

## 2013-08-14 MED ADMIN — ondansetron (ZOFRAN) injection 4 mg: INTRAVENOUS | @ 20:00:00 | NDC 23155037831

## 2013-08-14 MED ADMIN — sucralfate (CARAFATE) 1 GM/10ML suspension 1 g: ORAL | @ 05:00:00 | NDC 00121074710

## 2013-08-14 MED ADMIN — sodium chloride flush 0.9 % injection 10 mL: INTRAVENOUS | @ 12:00:00

## 2013-08-14 MED ADMIN — promethazine (PHENERGAN) injection 25 mg: INTRAVENOUS | @ 23:00:00 | NDC 00641092821

## 2013-08-14 MED ADMIN — sucralfate (CARAFATE) 1 GM/10ML suspension 1 g: ORAL | @ 10:00:00 | NDC 00121074710

## 2013-08-14 MED ADMIN — gabapentin (NEURONTIN) capsule 300 mg: ORAL | @ 02:00:00 | NDC 53746010201

## 2013-08-14 MED ADMIN — citalopram (CELEXA) tablet 40 mg: ORAL | @ 12:00:00 | NDC 68084074411

## 2013-08-14 MED ADMIN — cetirizine (ZYRTEC) tablet 10 mg: ORAL | @ 12:00:00 | NDC 51079059701

## 2013-08-14 MED ADMIN — sucralfate (CARAFATE) 1 GM/10ML suspension 1 g: ORAL | @ 22:00:00 | NDC 00121074710

## 2013-08-14 MED FILL — PROMETHAZINE HCL 25 MG/ML IJ SOLN: 25 MG/ML | INTRAMUSCULAR | Qty: 1

## 2013-08-14 MED FILL — CETIRIZINE HCL 10 MG PO TABS: 10 MG | ORAL | Qty: 1

## 2013-08-14 MED FILL — HYDROMORPHONE HCL PF 1 MG/ML IJ SOLN: 1 MG/ML | INTRAMUSCULAR | Qty: 1

## 2013-08-14 MED FILL — PROTONIX 40 MG IV SOLR: 40 MG | INTRAVENOUS | Qty: 40

## 2013-08-14 MED FILL — NORMAL SALINE FLUSH 0.9 % IV SOLN: 0.9 % | INTRAVENOUS | Qty: 10

## 2013-08-14 MED FILL — TRANSDERM-SCOP (1.5 MG) 1 MG/3DAYS TD PT72: 1 MG/3DAYS | TRANSDERMAL | Qty: 1

## 2013-08-14 MED FILL — NORMAL SALINE FLUSH 0.9 % IV SOLN: 0.9 % | INTRAVENOUS | Qty: 40

## 2013-08-14 MED FILL — NORMAL SALINE FLUSH 0.9 % IV SOLN: 0.9 % | INTRAVENOUS | Qty: 20

## 2013-08-14 MED FILL — CITALOPRAM HYDROBROMIDE 20 MG PO TABS: 20 MG | ORAL | Qty: 2

## 2013-08-14 MED FILL — SINGULAIR 10 MG PO TABS: 10 MG | ORAL | Qty: 1

## 2013-08-14 MED FILL — ONDANSETRON HCL 4 MG/2ML IJ SOLN: 4 MG/2ML | INTRAMUSCULAR | Qty: 2

## 2013-08-14 MED FILL — ROXICET 5-325 MG/5ML PO SOLN: 5-325 MG/5ML | ORAL | Qty: 10

## 2013-08-14 MED FILL — ALPRAZOLAM 0.5 MG PO TABS: 0.5 MG | ORAL | Qty: 1

## 2013-08-14 MED FILL — SUCRALFATE 1 GM/10ML PO SUSP: 1 GM/0ML | ORAL | Qty: 10

## 2013-08-14 MED FILL — GABAPENTIN 300 MG PO CAPS: 300 MG | ORAL | Qty: 1

## 2013-08-14 MED FILL — CLINIMIX E/DEXTROSE (5/20) 5 % IV SOLN: 5 % | INTRAVENOUS | Qty: 2000

## 2013-08-14 MED FILL — LOVENOX 40 MG/0.4ML SC SOLN: 40 MG/0.4ML | SUBCUTANEOUS | Qty: 0.4

## 2013-08-14 NOTE — Progress Notes (Signed)
Prn dilaudid given for pain 10/10 at this time.

## 2013-08-14 NOTE — Progress Notes (Signed)
SHIREEN RAYBURN is a 57 y.o. female patient.    Current Facility-Administered Medications   Medication Dose Route Frequency Provider Last Rate Last Dose   ??? alteplase (CATHFLO) injection 2 mg  2 mg Intracatheter Once Guss Bunde, MD       ??? oxyCODONE-acetaminophen (ROXICET) 5-325 MG/5ML solution 10 mL  10 mL Oral Q4H PRN Galen Manila, MD   10 mL at 08/14/13 0358   ??? pantoprazole (PROTONIX) injection 40 mg  40 mg Intravenous BID Durene Romans O'Toole Jr., DO   40 mg at 08/13/13 2147   ??? sucralfate (CARAFATE) 1 GM/10ML suspension 1 g  1 g Oral Q6H [facility] Terrance Milda Smart., DO   1 g at 08/14/13 1610   ??? insulin lispro (HUMALOG) injection pen 0-6 Units  0-6 Units Subcutaneous Q6H Lum Keas, MD   1 Units at 08/11/13 9604   ??? albuterol (PROVENTIL HFA;VENTOLIN HFA) 108 (90 BASE) MCG/ACT inhaler 2 puff  2 puff Inhalation Q6H PRN Lum Keas, MD       ??? ALPRAZolam Prudy Feeler) tablet 0.5 mg  0.5 mg Oral BID PRN Lum Keas, MD   0.5 mg at 08/10/13 1645   ??? cetirizine (ZYRTEC) tablet 10 mg  10 mg Oral Daily Lum Keas, MD   10 mg at 08/13/13 5409   ??? cloNIDine (CATAPRES) 0.1 MG/24HR 1 patch  1 patch Transdermal Weekly Lum Keas, MD   1 patch at 08/13/13 1000   ??? citalopram (CELEXA) tablet 40 mg  40 mg Oral Daily Lum Keas, MD   40 mg at 08/13/13 8119   ??? gabapentin (NEURONTIN) capsule 300 mg  300 mg Oral Nightly Lum Keas, MD   300 mg at 08/13/13 2147   ??? montelukast (SINGULAIR) tablet 10 mg  10 mg Oral Daily Lum Keas, MD   10 mg at 08/13/13 0857   ??? scopolamine (TRANSDERM-SCOP) 1.5 MG 1 patch  1 patch Transdermal Q72H Lum Keas, MD   1 patch at 08/11/13 [phone]   ??? sodium chloride flush 0.9 % injection 10 mL  10 mL Intravenous Q12H [facility] Lum Keas, MD   10 mL at 08/13/13 2148   ??? sodium chloride flush 0.9 % injection 10 mL  10 mL Intravenous PRN Lum Keas, MD   10 mL at 08/14/13 0359    ??? acetaminophen (TYLENOL) tablet 650 mg  650 mg Oral Q4H PRN Lum Keas, MD       ??? ondansetron [facility]) injection 4 mg  4 mg Intravenous Q6H PRN Lum Keas, MD   4 mg at 08/14/13 0037   ??? promethazine (PHENERGAN) injection 25 mg  25 mg Intravenous Q6H PRN Lum Keas, MD   25 mg at 08/14/13 0358   ??? HYDROmorphone HCl PF (DILAUDID) injection SOLN 1 mg  1 mg Intravenous Q4H PRN Lum Keas, MD   1 mg at 08/14/13 0240   ??? enoxaparin (LOVENOX) injection 40 mg  40 mg Subcutaneous Nightly Lum Keas, MD   40 mg at 08/13/13 2147     Allergies   Allergen Reactions   ??? Talwin [Pentazocine] Other (See Comments)     dizzy     Active Problems:    * No active hospital problems. *    Blood pressure 102/64, pulse 64, temperature 98.4 ??F (36.9 ??C), temperature source  Oral, resp. rate 16, height 5\' 6"  (1.676 m), weight 215 lb (97.523 kg), SpO2 96 %.    Subjective:  Symptoms:  (No significant change in abd pain and nausea).      Objective  Assessment:  (Cont current tx.  Orders and labs reviewed.).       Guss Bunde, MD  08/14/2013

## 2013-08-14 NOTE — Progress Notes (Signed)
Vital signs assessed and stable. Pt denies needs at this time.

## 2013-08-14 NOTE — Progress Notes (Signed)
Pt requested prn pain medication. Rating pain 10/10. Also c/o nausea. Medications administered. Pt denies further needs. Call light in reach.

## 2013-08-14 NOTE — Plan of Care (Signed)
Problem: Falls - Risk of  Goal: Absence of falls  Outcome: Ongoing  Pt will remain free from falls.  Bed will remain in lowest position, wheels locked.  Room will remain free of clutter.  Fall precautions in place.    Problem: Risk for Impaired Skin Integrity  Goal: Tissue integrity - skin and mucous membranes  Structural intactness and normal physiological function of skin and  mucous membranes.   Outcome: Met This Shift  Assess skin each shift.  Check for incontinence every 2 hours.  Turn and reposition every 2 hours.   No skin breakdown this shift.

## 2013-08-14 NOTE — Progress Notes (Signed)
Prn zofran given for nausea at this time.

## 2013-08-14 NOTE — Progress Notes (Signed)
Pt continues to rest quietly in bed. Appears comfortable. Respirations even and unlabored. Call light in reach.

## 2013-08-14 NOTE — Progress Notes (Signed)
Rounded on pt. Pt currently resting in bed with eyes closed. Appears comfortable. Respirations even and unlabored. Call light in reach.

## 2013-08-14 NOTE — Progress Notes (Signed)
Head to toe assessment completed. Pt c/o abdominal pain rated 9/10 along with nausea. Prn medication administered. Pt reports no BM in three days, will discuss with MD. Pt denies further needs. Call light in reach.

## 2013-08-14 NOTE — Progress Notes (Signed)
TPN hung and infusing. Pt resting in bed. Denies needs.

## 2013-08-14 NOTE — Progress Notes (Signed)
Shift assessment complete. VSS. A&O. Pain 10/10 in mid chest. Prn dilaudid given at this time. Respirations even and unlabored. Bowel sounds active in all 4 quadrants. C/o nausea. Prn zofran give at this time. Palpable pedal pulses noted. Cap refill brisk, no edema noted. Dressings to abdomen clean, dry, intact.POC discussed with patient. Pt denies any further needs. Call light within reach.

## 2013-08-14 NOTE — Progress Notes (Signed)
Patient up to shower. Back in bed. Family at bedside. Call light within reach.

## 2013-08-14 NOTE — Progress Notes (Signed)
Bedside handoff completed with Caitlin, RN.

## 2013-08-14 NOTE — Progress Notes (Signed)
Prn oxycodone given for pain 9/10 at this time. Prn phenergan given for nausea at this time.

## 2013-08-14 NOTE — Progress Notes (Signed)
[facility]  Gastroenterology Progress Note  Rachael Mays is a 57 y.o. female patient.  1. Intractable vomiting    2. Intractable abdominal pain        SUBJECTIVE:  She is tol po.  Says the burning and pain are tolerable with medication.  She wishes to wait til the 30 day to have the stent removed.     Physical    VITALS:  BP 102/64   Pulse 64   Temp(Src) 98.4 ??F (36.9 ??C) (Oral)   Resp 16   Ht 5\' 6"  (1.676 m)   Wt 215 lb (97.523 kg)   BMI 34.72 kg/m2   SpO2 96%  TEMPERATURE:  Current - Temp: 98.4 ??F (36.9 ??C); Max - Temp  Avg: 98.3 ??F (36.8 ??C)  Min: 98.1 ??F (36.7 ??C)  Max: 98.4 ??F (36.9 ??C)    Abdomen soft, ND, NT, no HSM, Bowel sounds normal   Mood good affect congruent  5/5 grip bue  cta bilat nl effort.     Data      Recent Labs      08/12/13   0657  08/13/13   0545  08/14/13   0616   WBC  7.7  7.1  7.2   HGB  11.3*  10.9*  11.6*   HCT  34.7*  33.1*  35.0*   MCV  84.5  83.9  84.1   PLT  244  228  251     Recent Labs      08/12/13   0657  08/13/13   0545  08/14/13   0616   NA  134*  137  139   K  4.5  4.0  4.6   CL  98*  102  103   CO2  30  27  29    PHOS  3.8  3.7  3.8   BUN  16  19  19    CREATININE  0.7  0.7  0.7           ASSESSMENT   1. Gastric leak s/p lap band removal and endoscopic stent placement. Still with burning discomfort likely from the stent.   She wishes to cont current mgt.       Cont current mgt in the interim.   Will d/w Dr. Daphine Deutscher about stent removal around day 30.     Kellie Moor, MD  [facility]

## 2013-08-14 NOTE — Progress Notes (Signed)
This RN agrees with all previous charting done by Caitlin, RN.

## 2013-08-15 LAB — POCT GLUCOSE
POC Glucose: 128 mg/dl — ABNORMAL HIGH (ref 70–99)
POC Glucose: 167 mg/dl — ABNORMAL HIGH (ref 70–99)
POC Glucose: 89 mg/dl (ref 70–99)
POC Glucose: 108 mg/dl — ABNORMAL HIGH (ref 70–99)
POC Glucose: 86 mg/dl (ref 70–99)

## 2013-08-15 LAB — PHOSPHORUS: Phosphorus: 3.4 mg/dL (ref 2.5–4.9)

## 2013-08-15 LAB — CBC WITH AUTO DIFFERENTIAL
Basophils %: 0.5 %
Basophils Absolute: 0 10*3/uL (ref 0.0–0.2)
Eosinophils %: 4 %
Eosinophils Absolute: 0.3 10*3/uL (ref 0.0–0.6)
Hematocrit: 35.4 % — ABNORMAL LOW (ref 36.0–48.0)
Hemoglobin: 11.4 g/dL — ABNORMAL LOW (ref 12.0–16.0)
Lymphocytes %: 25.9 %
Lymphocytes Absolute: 2 10*3/uL (ref 1.0–5.1)
MCH: 27.1 pg (ref 26.0–34.0)
MCHC: 32.3 g/dL (ref 31.0–36.0)
MCV: 84 fL (ref 80.0–100.0)
MPV: 8.2 fL (ref 5.0–10.5)
Monocytes %: 11.6 %
Monocytes Absolute: 0.9 10*3/uL (ref 0.0–1.3)
Neutrophils %: 58 %
Neutrophils Absolute: 4.4 10*3/uL (ref 1.7–7.7)
Platelets: 237 10*3/uL (ref 135–450)
RBC: 4.21 M/uL (ref 4.00–5.20)
RDW: 13.8 % (ref 12.4–15.4)
WBC: 7.7 10*3/uL (ref 4.0–11.0)

## 2013-08-15 LAB — BASIC METABOLIC PANEL
BUN: 20 mg/dL (ref 7–20)
CO2: 28 mmol/L (ref 21–32)
Calcium: 9.1 mg/dL (ref 8.3–10.6)
Chloride: 102 mmol/L (ref 99–110)
Creatinine: 0.7 mg/dL (ref 0.6–1.1)
GFR African American: >60 (ref >60)
GFR Non-African American: >60 (ref >60)
Glucose: 92 mg/dL (ref 70–99)
Potassium: 4.4 mmol/L (ref 3.5–5.1)
Sodium: 138 mmol/L (ref 136–145)

## 2013-08-15 LAB — MAGNESIUM: Magnesium: 2.1 mg/dL (ref 1.80–2.40)

## 2013-08-15 MED ADMIN — ondansetron (ZOFRAN) injection 4 mg: INTRAVENOUS | @ 02:00:00 | NDC 23155037831

## 2013-08-15 MED ADMIN — sucralfate (CARAFATE) 1 GM/10ML suspension 1 g: ORAL | @ 17:00:00 | NDC 00121074710

## 2013-08-15 MED ADMIN — ondansetron (ZOFRAN) injection 4 mg: INTRAVENOUS | @ 09:00:00 | NDC 23155037831

## 2013-08-15 MED ADMIN — PN-Adult Premix 5/20 - Standard Electrolytes - Central Line: INTRAVENOUS | @ 21:00:00 | NDC 00338112504

## 2013-08-15 MED ADMIN — montelukast (SINGULAIR) tablet 10 mg: ORAL | @ 14:00:00 | NDC 68084062011

## 2013-08-15 MED ADMIN — pantoprazole (PROTONIX) injection 40 mg: INTRAVENOUS | @ 03:00:00 | NDC 00008092355

## 2013-08-15 MED ADMIN — sodium chloride flush 0.9 % injection 10 mL: INTRAVENOUS | @ 05:00:00

## 2013-08-15 MED ADMIN — sucralfate (CARAFATE) 1 GM/10ML suspension 1 g: ORAL | @ 22:00:00 | NDC 00121074710

## 2013-08-15 MED ADMIN — sodium chloride flush 0.9 % injection 10 mL: INTRAVENOUS | @ 07:00:00

## 2013-08-15 MED ADMIN — sucralfate (CARAFATE) 1 GM/10ML suspension 1 g: ORAL | @ 10:00:00 | NDC 00121074710

## 2013-08-15 MED ADMIN — pantoprazole (PROTONIX) injection 40 mg: INTRAVENOUS | @ 14:00:00 | NDC 00008400101

## 2013-08-15 MED ADMIN — enoxaparin (LOVENOX) injection 40 mg: SUBCUTANEOUS | @ 03:00:00 | NDC 00075801401

## 2013-08-15 MED ADMIN — cetirizine (ZYRTEC) tablet 10 mg: ORAL | @ 14:00:00 | NDC 51079059701

## 2013-08-15 MED ADMIN — sucralfate (CARAFATE) 1 GM/10ML suspension 1 g: ORAL | @ 04:00:00 | NDC 00121074710

## 2013-08-15 MED ADMIN — sodium chloride flush 0.9 % injection 10 mL: INTRAVENOUS | @ 14:00:00

## 2013-08-15 MED ADMIN — promethazine (PHENERGAN) injection 25 mg: INTRAVENOUS | @ 05:00:00 | NDC 00641092821

## 2013-08-15 MED ADMIN — sodium chloride flush 0.9 % injection 10 mL: INTRAVENOUS | @ 09:00:00

## 2013-08-15 MED ADMIN — sodium chloride flush 0.9 % injection 10 mL: INTRAVENOUS | @ 16:00:00

## 2013-08-15 MED ADMIN — insulin lispro (HUMALOG) injection pen 0-6 Units: SUBCUTANEOUS | @ 09:00:00 | NDC 00002879901

## 2013-08-15 MED ADMIN — gabapentin (NEURONTIN) capsule 300 mg: ORAL | @ 02:00:00 | NDC 53746010201

## 2013-08-15 MED ADMIN — sodium chloride flush 0.9 % injection 10 mL: INTRAVENOUS | @ 02:00:00

## 2013-08-15 MED ADMIN — citalopram (CELEXA) tablet 40 mg: ORAL | @ 14:00:00 | NDC 68084074411

## 2013-08-15 MED ADMIN — ondansetron (ZOFRAN) injection 4 mg: INTRAVENOUS | @ 21:00:00 | NDC 23155037831

## 2013-08-15 MED FILL — CETIRIZINE HCL 10 MG PO TABS: 10 MG | ORAL | Qty: 1

## 2013-08-15 MED FILL — NORMAL SALINE FLUSH 0.9 % IV SOLN: 0.9 % | INTRAVENOUS | Qty: 10

## 2013-08-15 MED FILL — HYDROMORPHONE HCL PF 1 MG/ML IJ SOLN: 1 MG/ML | INTRAMUSCULAR | Qty: 1

## 2013-08-15 MED FILL — ONDANSETRON HCL 4 MG/2ML IJ SOLN: 4 MG/2ML | INTRAMUSCULAR | Qty: 2

## 2013-08-15 MED FILL — PROMETHAZINE HCL 25 MG/ML IJ SOLN: 25 MG/ML | INTRAMUSCULAR | Qty: 1

## 2013-08-15 MED FILL — NORMAL SALINE FLUSH 0.9 % IV SOLN: 0.9 % | INTRAVENOUS | Qty: 20

## 2013-08-15 MED FILL — ROXICET 5-325 MG/5ML PO SOLN: 5-325 MG/5ML | ORAL | Qty: 10

## 2013-08-15 MED FILL — PROTONIX 40 MG IV SOLR: 40 MG | INTRAVENOUS | Qty: 40

## 2013-08-15 MED FILL — CITALOPRAM HYDROBROMIDE 20 MG PO TABS: 20 MG | ORAL | Qty: 2

## 2013-08-15 MED FILL — SINGULAIR 10 MG PO TABS: 10 MG | ORAL | Qty: 1

## 2013-08-15 MED FILL — CLINIMIX E/DEXTROSE (5/20) 5 % IV SOLN: 5 % | INTRAVENOUS | Qty: 2000

## 2013-08-15 MED FILL — SUCRALFATE 1 GM/10ML PO SUSP: 1 GM/0ML | ORAL | Qty: 10

## 2013-08-15 MED FILL — NORMAL SALINE FLUSH 0.9 % IV SOLN: 0.9 % | INTRAVENOUS | Qty: 30

## 2013-08-15 MED FILL — LOVENOX 40 MG/0.4ML SC SOLN: 40 MG/0.4ML | SUBCUTANEOUS | Qty: 0.4

## 2013-08-15 MED FILL — GABAPENTIN 300 MG PO CAPS: 300 MG | ORAL | Qty: 1

## 2013-08-15 NOTE — Consults (Signed)
NUTRITION THERAPY ASSESSMENT     Admission date:08/05/2013      Timepoint: f/u eval     Rachael Mays is a 57 y.o. female  Pt admitted for emesis.  Hx of lap band (04/2008), band removed (06/2013), metal mesh stent stomach (07/2013), HTN, back pain.    9/9- Pt eating very little of Gi soft diet.  Dislikes ensure but is willing to try carnation instant bkft.  Cyclic PN continues.  Possible stent removal at the end of the week.    09/05- Patient diet was advanced to GI Soft and ensure started, however patient N/V continues and PO intake remains minimal. Patient reports less N today; had just ordered breakfast when RD arrived. Feeling hungry. Continues on PN overnight - 50 ml/hr taper up/down and 166 ml/hr x 12 hours.     9/1 Initial, consult for home TPN; pt NPO and on PN at 190 cc/hr x 10 hr with 1 hr x 50cc/hr taper up and down. Per med record, pt s/p lap band removal after band erosion, pt cont w/ N&V, on home PN. Records reveal 10 lb wt loss x 2 months (5% BW).     Previous admission:  07/2013 Rachael Mays is a 57 y.o. female admitted d/t had esophageal stent placed yesterday presents with uncontrolled pain and nausea and vomiting, unable to swallow; She was recently admitted 7/30 for gastric band erosion requiring removal. She was started on PN during that admission. Has been on nightly TPN at home. RD consult for PN. Currently NPO on PN @ 40 ml/hr.   8/25: Po intake is improved per pt. Reports had bites of egg & hash browns, a piece of toast, & choc milk for bkf. Cyclic PN will con't until pt able to consume adequate nutrition with po alone.   8/20: Pt is tolerating small amounts of clear liquids. Cyclic PN tolerated well @ goal. Cyclic PN: 50 ml taper plus 161 ml/hr x 10 hr    OBJECTIVE DATA  Patient Active Problem List    Diagnosis Date Noted   ??? HTN (hypertension) 07/24/2013   ??? Hyperglycemia 07/24/2013   ??? Wound infection (HCC) 07/22/2013   ??? Complication of gastric banding 07/05/2013   ??? 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 Vertigo 04/11/2013   ??? Dizziness 03/29/2013   ??? 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 Morbid obesity 02/21/2009   ??? Back Pain 01/23/2009   ??? Deep Vein Thrombosis 01/23/2009   ??? Depression 01/23/2009   ??? High Cholesterol 01/23/2009   ??? Hypertension 01/23/2009   ??? Pulmonary Embolism 01/23/2009   ??? Nausea & vomiting 01/23/2009   ??? Abdominal  pain, other specified site 01/23/2009     Past Medical History   Diagnosis Date   ??? Anxiety    ??? Asthma    ??? Chronic back pain    ??? Deep vein thrombosis    ??? Depression    ??? GERD (gastroesophageal reflux disease)      NO LONGER SINCE LAP   ??? Pulmonary embolism (HCC)    ??? Obesity      hx of; had lap band   ??? Hyperlipidemia      hx; resolved with lap band   ??? GERD (gastroesophageal reflux disease) 01/23/2009   ??? Hypertension        Labs  Recent Labs      08/13/13   0545  08/14/13   0616  08/15/13  0547   NA  137  139  138   K  4.0  4.6  4.4   CL  102  103  102   CO2  27  29  28    BUN  19  19  20    CREATININE  0.7  0.7  0.7   GLUCOSE  145*  108*  92     Lab Results   Component Value Date    PHOS 3.4 08/15/2013       No results found for this basename: AST, ALT, ALB, BILIDIR, BILITOT, ALKPHOS,  in the last 72 hours  Lab Results   Component Value Date    LABALBU 3.2 08/05/2013      Lab Results   Component Value Date    TRIG 102 07/24/2013    HDL 60 06/30/2013    HDL 52 03/31/2011    LDLCALC 138 06/30/2013    LABVLDL 19 06/30/2013     No results found for this basename: LIPASE,  in the last 72 hours  Lab Results   Component Value Date    LABA1C 5.2 03/26/2010       Medications  Continuous Medications:      ??? PN-Adult Premix 5/20 - Standard Electrolytes - Central Line       Scheduled Medications:   ??? alteplase  2 mg Intracatheter Once   ??? pantoprazole  40 mg Intravenous BID   ??? sucralfate  1 g Oral Q6H SCH   ??? insulin lispro  0-6 Units Subcutaneous Q6H   ??? cetirizine  10 mg Oral Daily   ??? cloNIDine  1 patch Transdermal Weekly   ??? citalopram   40 mg Oral Daily   ??? gabapentin  300 mg Oral Nightly   ??? montelukast  10 mg Oral Daily   ??? scopolamine  1 patch Transdermal Q72H   ??? sodium chloride flush  10 mL Intravenous Q12H [facility]   ??? enoxaparin  40 mg Subcutaneous Nightly       Anthropometric Measures   Height: 5\' 6"  (167.6 cm)   Current Weight: Weight: 215 lb (97.523 kg)   Admission weight: 215 lb (97.523 kg)  Weight Method: Stated  If applicable: n/a  Usual Body Weight:  lb       Ideal Body Weight:  lb      % Ideal Body Weight:  %  Adjusted Body Weight:  lb    Body mass index is 34.72 kg/(m^2).   Classified as:  Less than 18.5 Underweight  18.5-24.9 Normal Weight  25-29.9 Overweight  30-34.9 Obese Class I  35-39.9 Obese Class II  Greater than or equal to 40 Obese Class III    No data found.    Wt Readings from Last 50 Encounters:   08/05/13 215 lb (97.523 kg)   08/01/13 240 lb 4.8 oz (109 kg)   07/20/13 205 lb (92.987 kg)   07/09/13 236 lb (107.049 kg)   06/29/13 231 lb (104.781 kg)   06/12/13 225 lb (102.059 kg)   06/07/13 228 lb 8 oz (103.647 kg)   05/30/13 229 lb (103.874 kg)   05/22/13 230 lb 8 oz (104.554 kg)   05/19/13 231 lb 11.3 oz (105.1 kg)   05/18/13 225 lb (102.059 kg)   05/18/13 232 lb (105.235 kg)   04/27/13 229 lb 3.2 oz (103.964 kg)   04/17/13 230 lb (104.327 kg)   04/03/13 231 lb (104.781 kg)   03/29/13 228 lb 8 oz (103.647 kg)   03/10/13  227 lb 8 oz (103.193 kg)   02/23/13 227 lb 9.6 oz (103.239 kg)   12/28/12 217 lb (98.431 kg)   12/15/12 216 lb (97.977 kg)   10/20/12 213 lb (96.616 kg)   06/24/12 212 lb (96.163 kg)   06/02/12 210 lb (95.255 kg)   05/31/12 208 lb 3.2 oz (94.439 kg)   05/19/12 209 lb (94.802 kg)   04/14/12 211 lb (95.709 kg)   03/17/12 213 lb (96.616 kg)   03/16/12 214 lb (97.07 kg)   12/23/11 223 lb (101.152 kg)   11/14/11 220 lb (99.791 kg)   11/07/11 220 lb (99.791 kg)   11/03/11 226 lb (102.513 kg)   05/15/11 233 lb 12.8 oz (106.051 kg)   04/08/10 240 lb (108.863 kg)   04/01/10 240 lb (108.863 kg)   03/26/10 241 lb 2 oz  (109.374 kg)   02/06/10 248 lb 3 oz (112.577 kg)   11/07/09 243 lb (110.224 kg)   10/23/09 235 lb (106.595 kg)   08/07/09 246 lb (111.585 kg)   05/02/09 242 lb 5 oz (109.912 kg)   02/21/09 245 lb (111.131 kg)   01/23/09 244 lb 4 oz (110.791 kg)   11/19/08 243 lb 9 oz (110.479 kg)       Comparative Standards    Weight Used: 69 kg ABW  Estimated Calorie Needs: 1700-2000 (based on 25- 30kcal/kg)  Estimated Protein Needs: 82-103 (based on 1.2- 1.5gm/kg)  Estimated Fluid Needs: 1700-2000 (based on 36mL/kcal)    Nutrition-focused Physical Findings           Emesis: Emesis Appearance: Mucous, Yellow on admission  Stool:       none reported  Skin/Edema:  Skin Integrity (WDL): Exceptions to WDL   Location: left abd Skin Integrity: Other (Comment) (dressing)     Chewing / Swallowing:   Esophageal stent  Mental Status / Barriers:  no issues reported    Food/Nutrition-Related History  Pre-Admission / Home Diet:  Pre-Admission/Home Diet: General   Home Supplements / Herbals:    none noted  Food Restrictions / Cultural Requests:    none noted  Allergies as of 08/05/2013 - Review Complete 08/05/2013   Allergen Reaction Noted   ??? Talwin [pentazocine] Other (See Comments) 03/29/2013       PO Diet Orders  Current diet order: DIET GI SOFT;  PN-Adult Premix 5/20 - Standard Electrolytes - Central Line  Feeding Assistance:        Feeding: Able to feed self    Room Service: selective  Nursing Recorded PO Intake: PO Meals Eaten (%): 51 - 75%   Patient / Family Reported PO Intake: eating breakfast this AM; feeling hungry  Observed PO Intake:    Pain and PO intake: n/a.    Tube Feeding Order / Parenteral Order  PN goal 166 ml/hr x 12 hours w/ 1 hr x 50 cc/hr taper up and down providing 1753 kcal, 99.6 g protein      Intake/Output Summary (Last 24 hours) at 08/15/13 1115  Last data filed at 08/15/13 0452   Gross per 24 hour   Intake     70 ml   Output      0 ml   Net     70 ml       Intake vs. Needs: meets needs w/ PN    NUTRITION DIAGNOSIS  and GOAL  Nutrition Deficit Risk.   ?? Problem: adequate nutrient intake  ?? Etiology/related to: n/a  ?? Symptoms/Signs/as evidenced by: n/a  Goal: pt will tolerate adv of diet and consume greater than 50% of meals.   Initiated: 9/1     Progress:9/8 pm at 51-75% of meal, but not consistent     Resolved: n/a    EDUCATION  n/a      MALNUTRITION  Per AND/ASPEN guidelines, patient does not meet criteria for malnutrition, will monitor for additional RD, RN and MD documentation re: weight status, nutritional intake, fluid accumulation, possible loss of body fat or muscle mass, or possible reduced grip strength.    INTERVENTION HISTORY  9/1 initial assessment  9/5 consult  9/9 f/u assessment    NUTRITION PRESCRIPTION / RECOMMENDATIONS / MONITORING / EVALUATION   1. Continue current diet and encourage PO intake.   2. Continue PN at goal rate 166 ml/hr x 10 hours with 1 hr x 50 cc/hr taper up and down  3. Trial SF CIB ONS      Pt is at moderate nutritional risk. Will follow up in 4-7 days to monitor nutrition.  Consult dietitian if intervention is needed  before that time.    Deandrae Wajda MARIE Philis Doke RD, LD  Pager [phone]

## 2013-08-15 NOTE — Progress Notes (Signed)
Pt given PRN pain med per MD order. Pt requesting for this RN to do her dressing change later, after her pain medication has "kicked in". Pt denies any other needs at this time. Call light in reach.

## 2013-08-15 NOTE — Plan of Care (Signed)
Problem: Risk for Impaired Skin Integrity  Goal: Tissue integrity - skin and mucous membranes  Structural intactness and normal physiological function of skin and  mucous membranes.   Assess skin each shift.  Pt continent.  Encourage pt to turn and reposition every 2 hours.   No skin breakdown this shift.           Problem: Nausea/Vomiting  Goal: Absence of nausea/vomiting  Outcome: Not Met This Shift  Pt will be free from nausea and vomiting.   Encourage nonpharmacological methods of preventing nausea such as crackers and sprite.

## 2013-08-15 NOTE — Progress Notes (Signed)
Prn phenergan given for nausea at this time.

## 2013-08-15 NOTE — Progress Notes (Signed)
Bedside handoff report given to [place], Charity fundraiser. Pt denies any needs at this time. Call light in reach.

## 2013-08-15 NOTE — Progress Notes (Signed)
Assess complete. See flow sheet. Discussed POC for this shift. Patient c/o heartburn. Administered ivp protonix per order-see emar. Bedside table, phone and call light within reach.

## 2013-08-15 NOTE — Progress Notes (Signed)
Prn zofran given for nausea at this time.

## 2013-08-15 NOTE — Plan of Care (Signed)
Problem: Risk for Impaired Skin Integrity  Goal: Tissue integrity - skin and mucous membranes  Structural intactness and normal physiological function of skin and  mucous membranes.   Skin assessed QS. Encouraged patient to turn and reposition Q2H, assisting if needed to maintain skin integrity.         Problem: Pain  Goal: Reduction in pain sensation  Patient will maintain pain at a tolerable level. Pain assessed QS & PRN. Patient knowledgeable of pain scale, medication available & need to communicate w/ staff. Patient will be knowledgeable of side effects of pain medications.

## 2013-08-15 NOTE — Progress Notes (Signed)
PRN dilaudid given for 10/10 pain. Pt reminded that she can have Oxycodone as well and to call before pain reaches a 10/10, pt verbalizes understanding. Pt denies any further needs. Call light within reach.

## 2013-08-15 NOTE — Progress Notes (Signed)
Dressing to abdominal wound clean, dry, intact. Pt refused dressing change.

## 2013-08-15 NOTE — Progress Notes (Signed)
Pt resting in bed, denies any current needs. Call light in reach.

## 2013-08-15 NOTE — Progress Notes (Signed)
[facility]  Gastroenterology Progress Note  Rachael Mays is a 57 y.o. female patient.  1. Intractable vomiting    2. Intractable abdominal pain        SUBJECTIVE:  Stable no changes.     Physical    VITALS:  BP 116/67   Pulse 69   Temp(Src) 98.6 ??F (37 ??C) (Oral)   Resp 18   Ht 5\' 6"  (1.676 m)   Wt 215 lb (97.523 kg)   BMI 34.72 kg/m2   SpO2 98%  TEMPERATURE:  Current - Temp: 98.6 ??F (37 ??C); Max - Temp  Avg: 98.4 ??F (36.9 ??C)  Min: 98.1 ??F (36.7 ??C)  Max: 98.6 ??F (37 ??C)    Abdomen soft, ND, NT, no HSM, Bowel sounds normal     Data      Recent Labs      08/13/13   0545  08/14/13   0616  08/15/13   0547   WBC  7.1  7.2  7.7   HGB  10.9*  11.6*  11.4*   HCT  33.1*  35.0*  35.4*   MCV  83.9  84.1  84.0   PLT  228  251  237     Recent Labs      08/13/13   0545  08/14/13   0616  08/15/13   0547   NA  137  139  138   K  4.0  4.6  4.4   CL  102  103  102   CO2  27  29  28    PHOS  3.7  3.8  3.4   BUN  19  19  20    CREATININE  0.7  0.7  0.7               ASSESSMENT   1. Gastric leak s/p lap band removal and endoscopic stent placement. Still with burning discomfort likely from the stent. She wishes to cont current mgt.       Cont current mgt in the interim. Will d/w Dr. Daphine Deutscher may plan for stent removal around day 30.     Kellie Moor, MD  [facility]

## 2013-08-15 NOTE — Progress Notes (Signed)
Prn dilaudid given for pain 10/10 at this time. PICC line dressing changed. Call light within reach.

## 2013-08-15 NOTE — Progress Notes (Signed)
Patient c/o pain, nausea, heartburn and abdominal folds are red and painful. Administered with prn dilaudid, xanax and phenergan per request/order-see emar. Interdry placed between abdominal folds for comfort. Cool washcloth to forehead. Family at bedside. Will monitor.

## 2013-08-15 NOTE — Progress Notes (Signed)
VSS see flow sheet. Patient still having c/o heartburn-scheduled carafate administered at this time-see emar. Will monitor.

## 2013-08-15 NOTE — Progress Notes (Signed)
S:  Continues to have burning pain in epigastric region; emesis yesterday with acid sensation in mid chest  O:  BP 116/67   Pulse 69   Temp(Src) 98.6 ??F (37 ??C) (Oral)   Resp 18   Ht 5\' 6"  (1.676 m)   Wt 215 lb (97.523 kg)   BMI 34.72 kg/m2   SpO2 98%  gen-alert, no distress  cvs-rrr, no murmur  resp-cta bilaterally  abd-+bs, soft, nt, nd  Skin-mid abdomen with healing scale crust; left upper abdomen with dressing in place    Lab Results   Component Value Date    CREATININE 0.7 08/15/2013    BUN 20 08/15/2013    NA 138 08/15/2013    K 4.4 08/15/2013    CL 102 08/15/2013    CO2 28 08/15/2013     Lab Results   Component Value Date    WBC 7.7 08/15/2013    HGB 11.4* 08/15/2013    HCT 35.4* 08/15/2013    MCV 84.0 08/15/2013    PLT 237 08/15/2013     A/P:  57 y.o. female  1.  Abdominal pain and nausea-continue pain control, TPN, and clears as tolerated; surgery and GI recs appreciated; plan a this point is to consider esophageal stent removal around 30 days (end of this week)

## 2013-08-15 NOTE — Progress Notes (Signed)
This RN agrees with all previous charting done by Caitlin, RN.

## 2013-08-16 LAB — BASIC METABOLIC PANEL
BUN: 21 mg/dL — ABNORMAL HIGH (ref 7–20)
CO2: 28 mmol/L (ref 21–32)
Calcium: 9.2 mg/dL (ref 8.3–10.6)
Chloride: 101 mmol/L (ref 99–110)
Creatinine: 0.7 mg/dL (ref 0.6–1.1)
GFR African American: >60 (ref >60)
GFR Non-African American: >60 (ref >60)
Glucose: 96 mg/dL (ref 70–99)
Potassium: 4.5 mmol/L (ref 3.5–5.1)
Sodium: 136 mmol/L (ref 136–145)

## 2013-08-16 LAB — CBC WITH AUTO DIFFERENTIAL
Basophils %: 0.7 %
Basophils Absolute: 0.1 10*3/uL (ref 0.0–0.2)
Eosinophils %: 4.9 %
Eosinophils Absolute: 0.4 10*3/uL (ref 0.0–0.6)
Hematocrit: 36.5 % (ref 36.0–48.0)
Hemoglobin: 12 g/dL (ref 12.0–16.0)
Lymphocytes %: 31.7 %
Lymphocytes Absolute: 2.5 10*3/uL (ref 1.0–5.1)
MCH: 27.5 pg (ref 26.0–34.0)
MCHC: 32.9 g/dL (ref 31.0–36.0)
MCV: 83.8 fL (ref 80.0–100.0)
MPV: 8.4 fL (ref 5.0–10.5)
Monocytes %: 10.8 %
Monocytes Absolute: 0.8 10*3/uL (ref 0.0–1.3)
Neutrophils %: 51.9 %
Neutrophils Absolute: 4 10*3/uL (ref 1.7–7.7)
Platelets: 222 10*3/uL (ref 135–450)
RBC: 4.36 M/uL (ref 4.00–5.20)
RDW: 13.7 % (ref 12.4–15.4)
WBC: 7.8 10*3/uL (ref 4.0–11.0)

## 2013-08-16 LAB — POCT GLUCOSE
POC Glucose: 140 mg/dl — ABNORMAL HIGH (ref 70–99)
POC Glucose: 144 mg/dl — ABNORMAL HIGH (ref 70–99)
POC Glucose: 95 mg/dl (ref 70–99)
POC Glucose: 96 mg/dl (ref 70–99)
POC Glucose: 77 mg/dl (ref 70–99)

## 2013-08-16 LAB — PHOSPHORUS: Phosphorus: 4.4 mg/dL (ref 2.5–4.9)

## 2013-08-16 LAB — MAGNESIUM: Magnesium: 2 mg/dL (ref 1.80–2.40)

## 2013-08-16 MED ORDER — ALUM & MAG HYDROXIDE-SIMETH 200-200-20 MG/5ML PO SUSP
200-200-20 | ORAL | Status: DC | PRN
Start: 2013-08-16 — End: 2013-08-20
  Administered 2013-08-18 – 2013-08-20 (×3): via ORAL

## 2013-08-16 MED ORDER — MAGNESIUM HYDROXIDE 400 MG/5ML PO SUSP
400 | Freq: Every day | ORAL | Status: DC | PRN
Start: 2013-08-16 — End: 2013-08-20
  Administered 2013-08-17 (×2): via ORAL

## 2013-08-16 MED ADMIN — ondansetron (ZOFRAN) injection 4 mg: INTRAVENOUS | @ 17:00:00 | NDC 00641607801

## 2013-08-16 MED ADMIN — ondansetron (ZOFRAN) injection 4 mg: INTRAVENOUS | @ 07:00:00 | NDC 23155037831

## 2013-08-16 MED ADMIN — pantoprazole (PROTONIX) injection 40 mg: INTRAVENOUS | NDC 00008400101

## 2013-08-16 MED ADMIN — promethazine (PHENERGAN) injection 25 mg: INTRAVENOUS | @ 02:00:00 | NDC 00641092821

## 2013-08-16 MED ADMIN — sodium chloride flush 0.9 % injection 10 mL: INTRAVENOUS

## 2013-08-16 MED ADMIN — sodium chloride flush 0.9 % injection 10 mL: INTRAVENOUS | @ 07:00:00

## 2013-08-16 MED ADMIN — insulin lispro (HUMALOG) injection pen 0-6 Units: SUBCUTANEOUS | @ 08:00:00 | NDC 00002879901

## 2013-08-16 MED ADMIN — sucralfate (CARAFATE) 1 GM/10ML suspension 1 g: ORAL | @ 10:00:00 | NDC 66689079001

## 2013-08-16 MED ADMIN — sucralfate (CARAFATE) 1 GM/10ML suspension 1 g: ORAL | @ 03:00:00 | NDC 00121074710

## 2013-08-16 MED ADMIN — sodium chloride flush 0.9 % injection 10 mL: INTRAVENOUS | @ 11:00:00

## 2013-08-16 MED ADMIN — citalopram (CELEXA) tablet 40 mg: ORAL | @ 14:00:00 | NDC 68084074411

## 2013-08-16 MED ADMIN — ALPRAZolam (XANAX) tablet 0.5 mg: ORAL | @ 02:00:00 | NDC 68084067211

## 2013-08-16 MED ADMIN — sucralfate (CARAFATE) 1 GM/10ML suspension 1 g: ORAL | @ 23:00:00 | NDC 66689079001

## 2013-08-16 MED ADMIN — enoxaparin (LOVENOX) injection 40 mg: SUBCUTANEOUS | @ 02:00:00 | NDC 00075801401

## 2013-08-16 MED ADMIN — montelukast (SINGULAIR) tablet 10 mg: ORAL | @ 14:00:00 | NDC 68084062011

## 2013-08-16 MED ADMIN — pantoprazole (PROTONIX) injection 40 mg: INTRAVENOUS | @ 14:00:00 | NDC 00008400101

## 2013-08-16 MED ADMIN — insulin lispro (HUMALOG) injection pen 0-6 Units: SUBCUTANEOUS | @ 03:00:00 | NDC 00002879901

## 2013-08-16 MED ADMIN — sodium chloride flush 0.9 % injection 10 mL: INTRAVENOUS | @ 08:00:00

## 2013-08-16 MED ADMIN — sucralfate (CARAFATE) 1 GM/10ML suspension 1 g: ORAL | @ 17:00:00 | NDC 66689079001

## 2013-08-16 MED ADMIN — cetirizine (ZYRTEC) tablet 10 mg: ORAL | @ 14:00:00 | NDC 51079059701

## 2013-08-16 MED ADMIN — promethazine (PHENERGAN) injection 25 mg: INTRAVENOUS | @ 08:00:00 | NDC 00641092821

## 2013-08-16 MED ADMIN — sodium chloride flush 0.9 % injection 10 mL: INTRAVENOUS | @ 14:00:00

## 2013-08-16 MED FILL — CITALOPRAM HYDROBROMIDE 20 MG PO TABS: 20 MG | ORAL | Qty: 2

## 2013-08-16 MED FILL — PROTONIX 40 MG IV SOLR: 40 MG | INTRAVENOUS | Qty: 40

## 2013-08-16 MED FILL — CETIRIZINE HCL 10 MG PO TABS: 10 MG | ORAL | Qty: 1

## 2013-08-16 MED FILL — PROMETHAZINE HCL 25 MG/ML IJ SOLN: 25 MG/ML | INTRAMUSCULAR | Qty: 1

## 2013-08-16 MED FILL — SUCRALFATE 1 GM/10ML PO SUSP: 1 GM/0ML | ORAL | Qty: 10

## 2013-08-16 MED FILL — ONDANSETRON HCL 4 MG/2ML IJ SOLN: 4 MG/2ML | INTRAMUSCULAR | Qty: 2

## 2013-08-16 MED FILL — HYDROMORPHONE HCL PF 1 MG/ML IJ SOLN: 1 MG/ML | INTRAMUSCULAR | Qty: 1

## 2013-08-16 MED FILL — LOVENOX 40 MG/0.4ML SC SOLN: 40 MG/0.4ML | SUBCUTANEOUS | Qty: 0.4

## 2013-08-16 MED FILL — NORMAL SALINE FLUSH 0.9 % IV SOLN: 0.9 % | INTRAVENOUS | Qty: 20

## 2013-08-16 MED FILL — ALPRAZOLAM 0.5 MG PO TABS: 0.5 MG | ORAL | Qty: 1

## 2013-08-16 MED FILL — NORMAL SALINE FLUSH 0.9 % IV SOLN: 0.9 % | INTRAVENOUS | Qty: 40

## 2013-08-16 MED FILL — NORMAL SALINE FLUSH 0.9 % IV SOLN: 0.9 % | INTRAVENOUS | Qty: 30

## 2013-08-16 MED FILL — NORMAL SALINE FLUSH 0.9 % IV SOLN: 0.9 % | INTRAVENOUS | Qty: 10

## 2013-08-16 MED FILL — CLINIMIX E/DEXTROSE (5/20) 5 % IV SOLN: 5 % | INTRAVENOUS | Qty: 2000

## 2013-08-16 MED FILL — ROXICET 5-325 MG/5ML PO SOLN: 5-325 MG/5ML | ORAL | Qty: 10

## 2013-08-16 MED FILL — SINGULAIR 10 MG PO TABS: 10 MG | ORAL | Qty: 1

## 2013-08-16 NOTE — Progress Notes (Signed)
VSS see flow sheet. Carafate administered per order. See emar. Denies any other needs at this time. Will monitor.

## 2013-08-16 NOTE — Progress Notes (Signed)
Pt given PRN medication for c/o pain 10/10 in abdomen, reported upon waking. Pt denies any other needs at this time. Call light in reach.

## 2013-08-16 NOTE — Progress Notes (Signed)
Rachael Mays is a 57 y.o. female patient.    Current Facility-Administered Medications   Medication Dose Route Frequency Provider Last Rate Last Dose   ??? alteplase (CATHFLO) injection 2 mg  2 mg Intracatheter Once Guss Bunde, MD       ??? oxyCODONE-acetaminophen (ROXICET) 5-325 MG/5ML solution 10 mL  10 mL Oral Q4H PRN Galen Manila, MD   10 mL at 08/15/13 1019   ??? pantoprazole (PROTONIX) injection 40 mg  40 mg Intravenous BID Durene Romans O'Toole Jr., DO   40 mg at 08/15/13 2006   ??? sucralfate (CARAFATE) 1 GM/10ML suspension 1 g  1 g Oral Q6H [facility] Terrance Milda Smart., DO   1 g at 08/16/13 9604   ??? insulin lispro (HUMALOG) injection pen 0-6 Units  0-6 Units Subcutaneous Q6H Lum Keas, MD   1 Units at 08/16/13 [phone]   ??? albuterol (PROVENTIL HFA;VENTOLIN HFA) 108 (90 BASE) MCG/ACT inhaler 2 puff  2 puff Inhalation Q6H PRN Lum Keas, MD       ??? ALPRAZolam Prudy Feeler) tablet 0.5 mg  0.5 mg Oral BID PRN Lum Keas, MD   0.5 mg at 08/15/13 2136   ??? cetirizine (ZYRTEC) tablet 10 mg  10 mg Oral Daily Lum Keas, MD   10 mg at 08/15/13 1005   ??? cloNIDine (CATAPRES) 0.1 MG/24HR 1 patch  1 patch Transdermal Weekly Lum Keas, MD   1 patch at 08/13/13 1000   ??? citalopram (CELEXA) tablet 40 mg  40 mg Oral Daily Lum Keas, MD   40 mg at 08/15/13 1005   ??? gabapentin (NEURONTIN) capsule 300 mg  300 mg Oral Nightly Lum Keas, MD   300 mg at 08/14/13 2153   ??? montelukast (SINGULAIR) tablet 10 mg  10 mg Oral Daily Lum Keas, MD   10 mg at 08/15/13 1006   ??? scopolamine (TRANSDERM-SCOP) 1.5 MG 1 patch  1 patch Transdermal Q72H Lum Keas, MD   1 patch at 08/14/13 [phone]   ??? sodium chloride flush 0.9 % injection 10 mL  10 mL Intravenous Q12H [facility] Lum Keas, MD   10 mL at 08/15/13 2006   ??? sodium chloride flush 0.9 % injection 10 mL  10 mL Intravenous PRN Lum Keas, MD   10 mL at 08/16/13 1478    ??? acetaminophen (TYLENOL) tablet 650 mg  650 mg Oral Q4H PRN Lum Keas, MD       ??? ondansetron [facility]) injection 4 mg  4 mg Intravenous Q6H PRN Lum Keas, MD   4 mg at 08/16/13 0247   ??? promethazine (PHENERGAN) injection 25 mg  25 mg Intravenous Q6H PRN Lum Keas, MD   25 mg at 08/16/13 0351   ??? HYDROmorphone HCl PF (DILAUDID) injection SOLN 1 mg  1 mg Intravenous Q4H PRN Lum Keas, MD   1 mg at 08/16/13 2956   ??? enoxaparin (LOVENOX) injection 40 mg  40 mg Subcutaneous Nightly Lum Keas, MD   40 mg at 08/15/13 2136     Allergies   Allergen Reactions   ??? Talwin [Pentazocine] Other (See Comments)     dizzy     Active Problems:    * No active hospital problems. *    Blood pressure 112/73, pulse 73, temperature 98.6 ??F (37 ??C), temperature source  Oral, resp. rate 14, height 5\' 6"  (1.676 m), weight 215 lb (97.523 kg), SpO2 96 %.    Subjective:  Symptoms:  (No signif change -cont c the pain and nausea).      Objective  Assessment:  (Clinically same .  Orders and labs reviewed.).       Guss Bunde, MD  08/16/2013

## 2013-08-16 NOTE — Progress Notes (Signed)
Pt resting in bed, eyes closed, breathing easy and even. Call light in reach.

## 2013-08-16 NOTE — Progress Notes (Signed)
Dr. Tyler Aas called back right away and ordered milk of magnesia and some Maalox. No other new orders at this time.

## 2013-08-16 NOTE — Progress Notes (Signed)
Patient resting comfortably.  Tolerating liquids w/ one episode of emesis last night.      Filed Vitals:    08/16/13 0739   BP: 108/65   Pulse: 71   Temp: 98 ??F (36.7 ??C)   Resp: 16     Abd: soft nt/nd.  Wound healing well      AP:  Stent in place- removal per GI  Patient tol liquids with occassional n/v  Pain appears to be controlled.  Will follow

## 2013-08-16 NOTE — Progress Notes (Signed)
Assess complete. See flow sheet. Discussed POC for this shift. HS medications administered, as well as prn xanax, dilaudid, MOM and phenergan per order/request. See emar and flow sheet. Dressing changed per order. Small amount of serous drainage noted. Patient has multiple red areas-some with white appearing areas on top of the red around edge of tegaderm dressing. Placed medipore surgical tape over dressing with minimal contact with patients skin and avoiding the red raised areas. Inter dry placed in abdominal folds. Denies any other needs at this time. Will monitor.

## 2013-08-16 NOTE — Progress Notes (Signed)
Medicated with prn dilaudid and zofran per order/request. See emar and flow sheet. Patient had small amount of emesis.  VSS see flow sheet. Denies any other needs at this time. Will monitor.

## 2013-08-16 NOTE — Progress Notes (Signed)
Pt refusing TPN while family is here and they are going to eat dinner. She wants to get cleaned up with them and then states she will accept the TPN being hooked up to her.

## 2013-08-16 NOTE — Plan of Care (Signed)
Problem: Falls - Risk of  Goal: Absence of falls  Patient Remains free of falls or injury this shift. Fall risk assessment conducted QS & PRN. Visual rounding will be completed to verify pt is safe & free of falls. Bed will be in lowest position & pt and/or family will be educated on using call light to prevent falls. RN will utilize necessary equipment when moving a pt to prevent falls Fall prevention initiated. Bed in lowest position & wheels locked. Call light within reach.               Problem: Risk for Impaired Skin Integrity  Goal: Tissue integrity - skin and mucous membranes  Structural intactness and normal physiological function of skin and  mucous membranes.   Skin assessed QS. Encouraged patient to turn and reposition Q2H, assisting if needed to maintain skin integrity. Dressing change per order.        Problem: Pain  Goal: Reduction in pain sensation  Patient will maintain pain at a tolerable level. Pain assessed QS & PRN. Patient knowledgeable of pain scale, medication available & need to communicate w/ staff. Patient will be knowledgeable of side effects of pain medications.

## 2013-08-16 NOTE — Progress Notes (Signed)
Report and bedside handoff complete with Anna Murphy, RN

## 2013-08-16 NOTE — Progress Notes (Signed)
Medicated with prn dilaudid per order/request. See emar and flow sheet. Will monitor.

## 2013-08-16 NOTE — Progress Notes (Signed)
BG=144. Administered ss humalog per order and phenergan per request/order-see emar. Denies any other needs at this time. Bedside table, phone and call light within reach.

## 2013-08-16 NOTE — Progress Notes (Signed)
AM labs drawn per order/protocol. LPICC has brisk blood return and flushes without difficulty. Am medication administered per order. See emar.

## 2013-08-16 NOTE — Progress Notes (Signed)
Paging MD to ask for something to help pt with her acid reflux and having a bowel movement. Pt states it has now been a little over 3 days since she has had a bowel movement.

## 2013-08-17 LAB — CBC WITH AUTO DIFFERENTIAL
Basophils %: 0.4 %
Basophils Absolute: 0 10*3/uL (ref 0.0–0.2)
Eosinophils %: 6 %
Eosinophils Absolute: 0.4 10*3/uL (ref 0.0–0.6)
Hematocrit: 32.5 % — ABNORMAL LOW (ref 36.0–48.0)
Hemoglobin: 10.6 g/dL — ABNORMAL LOW (ref 12.0–16.0)
Lymphocytes %: 26.4 %
Lymphocytes Absolute: 1.7 10*3/uL (ref 1.0–5.1)
MCH: 27.8 pg (ref 26.0–34.0)
MCHC: 32.8 g/dL (ref 31.0–36.0)
MCV: 84.7 fL (ref 80.0–100.0)
MPV: 7.9 fL (ref 5.0–10.5)
Monocytes %: 11.3 %
Monocytes Absolute: 0.7 10*3/uL (ref 0.0–1.3)
Neutrophils %: 55.9 %
Neutrophils Absolute: 3.6 10*3/uL (ref 1.7–7.7)
Platelets: 200 10*3/uL (ref 135–450)
RBC: 3.83 M/uL — ABNORMAL LOW (ref 4.00–5.20)
RDW: 14.1 % (ref 12.4–15.4)
WBC: 6.4 10*3/uL (ref 4.0–11.0)

## 2013-08-17 LAB — POCT GLUCOSE
POC Glucose: 108 mg/dl — ABNORMAL HIGH (ref 70–99)
POC Glucose: 142 mg/dl — ABNORMAL HIGH (ref 70–99)
POC Glucose: 87 mg/dl (ref 70–99)
POC Glucose: 90 mg/dl (ref 70–99)

## 2013-08-17 LAB — BASIC METABOLIC PANEL
BUN: 22 mg/dL — ABNORMAL HIGH (ref 7–20)
CO2: 27 mmol/L (ref 21–32)
Calcium: 8.7 mg/dL (ref 8.3–10.6)
Chloride: 103 mmol/L (ref 99–110)
Creatinine: 0.6 mg/dL (ref 0.6–1.1)
GFR African American: >60 (ref >60)
GFR Non-African American: >60 (ref >60)
Glucose: 116 mg/dL — ABNORMAL HIGH (ref 70–99)
Potassium: 3.9 mmol/L (ref 3.5–5.1)
Sodium: 137 mmol/L (ref 136–145)

## 2013-08-17 LAB — PHOSPHORUS: Phosphorus: 3.5 mg/dL (ref 2.5–4.9)

## 2013-08-17 LAB — MAGNESIUM: Magnesium: 2 mg/dL (ref 1.80–2.40)

## 2013-08-17 MED ADMIN — PN-Adult Premix 5/20 - Standard Electrolytes - Central Line: INTRAVENOUS | NDC 63323014397

## 2013-08-17 MED ADMIN — citalopram (CELEXA) tablet 40 mg: ORAL | @ 14:00:00 | NDC 68084074411

## 2013-08-17 MED ADMIN — sucralfate (CARAFATE) 1 GM/10ML suspension 1 g: ORAL | @ 11:00:00 | NDC 68094017159

## 2013-08-17 MED ADMIN — sodium chloride flush 0.9 % injection 10 mL: INTRAVENOUS | @ 14:00:00

## 2013-08-17 MED ADMIN — scopolamine (TRANSDERM-SCOP) 1.5 MG 1 patch: TRANSDERMAL | @ 14:00:00 | NDC 10019055301

## 2013-08-17 MED ADMIN — sucralfate (CARAFATE) 1 GM/10ML suspension 1 g: ORAL | @ 22:00:00 | NDC 00121074710

## 2013-08-17 MED ADMIN — pantoprazole (PROTONIX) injection 40 mg: INTRAVENOUS | @ 02:00:00 | NDC 00008400101

## 2013-08-17 MED ADMIN — enoxaparin (LOVENOX) injection 40 mg: SUBCUTANEOUS | @ 02:00:00 | NDC 00075801401

## 2013-08-17 MED ADMIN — promethazine (PHENERGAN) injection 25 mg: INTRAVENOUS | @ 17:00:00 | NDC 00641092821

## 2013-08-17 MED ADMIN — pantoprazole (PROTONIX) injection 40 mg: INTRAVENOUS | @ 14:00:00 | NDC 00008400101

## 2013-08-17 MED ADMIN — ondansetron (ZOFRAN) injection 4 mg: INTRAVENOUS | @ 20:00:00 | NDC 23155037831

## 2013-08-17 MED ADMIN — PN-Adult Premix 5/20 - Standard Electrolytes - Central Line: INTRAVENOUS | @ 22:00:00 | NDC 00338112504

## 2013-08-17 MED ADMIN — sucralfate (CARAFATE) 1 GM/10ML suspension 1 g: ORAL | @ 17:00:00 | NDC 00121074710

## 2013-08-17 MED ADMIN — ALPRAZolam (XANAX) tablet 0.5 mg: ORAL | @ 02:00:00 | NDC 68084067211

## 2013-08-17 MED ADMIN — sodium chloride flush 0.9 % injection 10 mL: INTRAVENOUS | @ 07:00:00

## 2013-08-17 MED ADMIN — promethazine (PHENERGAN) injection 25 mg: INTRAVENOUS | @ 11:00:00 | NDC 00641092821

## 2013-08-17 MED ADMIN — sodium chloride flush 0.9 % injection 10 mL: INTRAVENOUS

## 2013-08-17 MED ADMIN — cetirizine (ZYRTEC) tablet 10 mg: ORAL | @ 14:00:00 | NDC 51079059701

## 2013-08-17 MED ADMIN — sucralfate (CARAFATE) 1 GM/10ML suspension 1 g: ORAL | @ 03:00:00 | NDC 00121074710

## 2013-08-17 MED ADMIN — insulin lispro (HUMALOG) injection pen 0-6 Units: SUBCUTANEOUS | @ 08:00:00 | NDC 00002879901

## 2013-08-17 MED ADMIN — sodium chloride flush 0.9 % injection 10 mL: INTRAVENOUS | @ 02:00:00

## 2013-08-17 MED ADMIN — promethazine (PHENERGAN) injection 25 mg: INTRAVENOUS | @ 02:00:00 | NDC 00641092821

## 2013-08-17 MED ADMIN — gabapentin (NEURONTIN) capsule 300 mg: ORAL | @ 02:00:00 | NDC 53746010201

## 2013-08-17 MED ADMIN — sodium chloride flush 0.9 % injection 10 mL: INTRAVENOUS | @ 11:00:00

## 2013-08-17 MED ADMIN — montelukast (SINGULAIR) tablet 10 mg: ORAL | @ 14:00:00 | NDC 68084062011

## 2013-08-17 MED ADMIN — promethazine (PHENERGAN) injection 25 mg: INTRAVENOUS | @ 23:00:00 | NDC 00641092821

## 2013-08-17 MED ADMIN — ondansetron (ZOFRAN) injection 4 mg: INTRAVENOUS | @ 14:00:00 | NDC 23155037831

## 2013-08-17 MED ADMIN — promethazine (PHENERGAN) injection 25 mg: INTRAVENOUS | @ 07:00:00 | NDC 00641092821

## 2013-08-17 MED FILL — HYDROMORPHONE HCL PF 1 MG/ML IJ SOLN: 1 MG/ML | INTRAMUSCULAR | Qty: 1

## 2013-08-17 MED FILL — GABAPENTIN 300 MG PO CAPS: 300 MG | ORAL | Qty: 1

## 2013-08-17 MED FILL — ONDANSETRON HCL 4 MG/2ML IJ SOLN: 4 MG/2ML | INTRAMUSCULAR | Qty: 2

## 2013-08-17 MED FILL — NORMAL SALINE FLUSH 0.9 % IV SOLN: 0.9 % | INTRAVENOUS | Qty: 20

## 2013-08-17 MED FILL — NORMAL SALINE FLUSH 0.9 % IV SOLN: 0.9 % | INTRAVENOUS | Qty: 60

## 2013-08-17 MED FILL — CETIRIZINE HCL 10 MG PO TABS: 10 MG | ORAL | Qty: 1

## 2013-08-17 MED FILL — CITALOPRAM HYDROBROMIDE 20 MG PO TABS: 20 MG | ORAL | Qty: 2

## 2013-08-17 MED FILL — NORMAL SALINE FLUSH 0.9 % IV SOLN: 0.9 % | INTRAVENOUS | Qty: 10

## 2013-08-17 MED FILL — PROTONIX 40 MG IV SOLR: 40 MG | INTRAVENOUS | Qty: 40

## 2013-08-17 MED FILL — TRANSDERM-SCOP (1.5 MG) 1 MG/3DAYS TD PT72: 1 MG/3DAYS | TRANSDERMAL | Qty: 1

## 2013-08-17 MED FILL — SUCRALFATE 1 GM/10ML PO SUSP: 1 GM/0ML | ORAL | Qty: 10

## 2013-08-17 MED FILL — SINGULAIR 10 MG PO TABS: 10 MG | ORAL | Qty: 1

## 2013-08-17 MED FILL — ALPRAZOLAM 0.5 MG PO TABS: 0.5 MG | ORAL | Qty: 1

## 2013-08-17 MED FILL — PROMETHAZINE HCL 25 MG/ML IJ SOLN: 25 MG/ML | INTRAMUSCULAR | Qty: 1

## 2013-08-17 MED FILL — MAGNESIUM HYDROXIDE 400 MG/5ML PO SUSP: 400 MG/5ML | ORAL | Qty: 30

## 2013-08-17 MED FILL — LOVENOX 40 MG/0.4ML SC SOLN: 40 MG/0.4ML | SUBCUTANEOUS | Qty: 0.4

## 2013-08-17 MED FILL — CLINIMIX E/DEXTROSE (5/20) 5 % IV SOLN: 5 % | INTRAVENOUS | Qty: 2000

## 2013-08-17 NOTE — Progress Notes (Signed)
Shift assessment complete; pt resting comfortably in bed.

## 2013-08-17 NOTE — Progress Notes (Signed)
VSS. Pt resting comfortably in bed. No needs at this time. Call light within reach. Will continue to monitor.

## 2013-08-17 NOTE — Plan of Care (Signed)
Problem: Falls - Risk of  Goal: Absence of falls  Outcome: Ongoing  Pt does not score as a high fall risk.  Standard fall precautions in place. Will reassess fall risk each shift and PRN.

## 2013-08-17 NOTE — Progress Notes (Signed)
Spoke with Dr. Daphine Deutscher and he will plan to remove the stent tomorrow.      Will make npo p midnight.     Kellie Moor, MD  [facility]

## 2013-08-17 NOTE — Progress Notes (Signed)
HS medications administered, as well as PRN dilaudid, maalox, xanax and zofran and  per order. See emar. Dressing change to abdomen performed per order. No drainage noted. The red raised areas from the previous day have scabs and appear to be healing and much less red/swollen. Placed medipore surgical tape over dressing with minimal contact with patients skin and avoiding the red areas. Inter dry placed in abdominal folds. VSS see flow sheet. Denies any other needs at this time. Will monitor

## 2013-08-17 NOTE — Progress Notes (Signed)
[facility]  Continue to follow for home care.  [facility] [phone] in place for home infusion.

## 2013-08-17 NOTE — Progress Notes (Signed)
Report and bedside handoff complete with Jennifer Schiele, RN

## 2013-08-17 NOTE — Progress Notes (Signed)
BG=142. SS humalog coverage per order-see emar.

## 2013-08-17 NOTE — Progress Notes (Signed)
Pt sleeping quietly in bed, but stirred easily for vs. VSS see flow sheet. Medicated with prn dilaudid and phenergan per order/request. See emar and flow sheet. Denies any other needs at this time. Will monitor. Bedside table, phone and call light within reach.

## 2013-08-17 NOTE — Progress Notes (Signed)
Department of Family Practice  Adult Daily Progress Note      SUBJECTIVE      Feeling ok, still lot pain, tol english muffin and scramble eggs last night w/o vomiting.    OBJECTIVE    Physical  SpO2: 96 %     Patient Vitals for the past 24 hrs:   BP Temp Temp src Pulse Resp SpO2   08/17/13 0709 115/66 mmHg 98.1 ??F (36.7 ??C) Oral 81 16 96 %   08/17/13 0246 122/70 mmHg 98.1 ??F (36.7 ??C) Oral 75 14 97 %   08/16/13 2300 104/63 mmHg 98.7 ??F (37.1 ??C) Oral 72 14 95 %   08/16/13 1916 107/68 mmHg 98.2 ??F (36.8 ??C) - 61 14 98 %     Alert and oriented x 4 NAD, Obese, well hydrated, well developed.  ;ung clear  CV rrr  abd benign      Data    CBC:   Lab Results   Component Value Date    WBC 6.4 08/17/2013    RBC 3.83 08/17/2013    HGB 10.6 08/17/2013    HCT 32.5 08/17/2013    MCV 84.7 08/17/2013    MCH 27.8 08/17/2013    MCHC 32.8 08/17/2013    RDW 14.1 08/17/2013    PLT 200 08/17/2013    MPV 7.9 08/17/2013     BMP:    Lab Results   Component Value Date    NA 137 08/17/2013    K 3.9 08/17/2013    CL 103 08/17/2013    CO2 27 08/17/2013    BUN 22 08/17/2013    LABALBU 3.2 08/05/2013    CREATININE 0.6 08/17/2013    CREATININE 0.8 08/05/2013    CALCIUM 8.7 08/17/2013    GFRAA >60 08/17/2013    GFRAA >60 03/16/2012    LABGLOM >60 08/17/2013    GLUCOSE 116 08/17/2013     Magnesium:    Lab Results   Component Value Date    MG 2.00 08/17/2013     Phosphorus:    Lab Results   Component Value Date    PHOS 3.5 08/17/2013       ASSESSMENT AND PLAN:    Chest pain/vomiting:      Feeling little better, tol PO better, still lot of pain in chest, burning, requiring reg IV pain meds, plan is to pull stent at day 30 (tomorrow? Next week?)  Supportive tx until then.

## 2013-08-17 NOTE — Progress Notes (Signed)
AM labs drawn per order/protocol. LPICC has brisk blood return and flushes without difficulty. Medicated with prn dilaudid and phenergan per order/request. See emar and flow sheet. Will monitor.

## 2013-08-17 NOTE — Progress Notes (Signed)
Pt resting comfortably in bed. No needs at this time. Call light within reach. Will continue to monitor.

## 2013-08-17 NOTE — Plan of Care (Signed)
Problem: Falls - Risk of  Goal: Absence of falls  Patient Remains free of falls or injury this shift. Fall risk assessment conducted QS & PRN. Visual rounding will be completed to verify pt is safe & free of falls. Bed will be in lowest position & pt and/or family will be educated on using call light to prevent falls. RN will utilize necessary equipment when moving a pt to prevent falls Fall prevention initiated. Bed in lowest position & wheels locked. Call light within reach.               Problem: Risk for Impaired Skin Integrity  Goal: Tissue integrity - skin and mucous membranes  Structural intactness and normal physiological function of skin and  mucous membranes.   Skin assessed QS. Encouraged patient to turn and reposition Q2H, assisting if needed to maintain skin integrity.         Problem: Pain  Goal: Reduction in pain sensation  Patient will maintain pain at a tolerable level. Pain assessed QS & PRN. Patient knowledgeable of pain scale, medication available & need to communicate w/ staff. Patient will be knowledgeable of side effects of pain medications.

## 2013-08-17 NOTE — Progress Notes (Signed)
Assess complete. See flow sheet. Discussed POC for this shift. Denies any needs at this time. Bedside table, phone and call light within reach.

## 2013-08-17 NOTE — Progress Notes (Signed)
Bedside handoff with Rosanna, RN. Pt resting in bed at this time. Pt does not express any needs at this time. Bed in lowest position and call light in reach

## 2013-08-18 LAB — BASIC METABOLIC PANEL
BUN: 18 mg/dL (ref 7–20)
CO2: 27 mmol/L (ref 21–32)
Calcium: 7.9 mg/dL — ABNORMAL LOW (ref 8.3–10.6)
Chloride: 106 mmol/L (ref 99–110)
Creatinine: 0.6 mg/dL (ref 0.6–1.1)
GFR African American: >60 (ref >60)
GFR Non-African American: >60 (ref >60)
Glucose: 93 mg/dL (ref 70–99)
Potassium: 3.5 mmol/L (ref 3.5–5.1)
Sodium: 139 mmol/L (ref 136–145)

## 2013-08-18 LAB — POCT GLUCOSE
POC Glucose: 127 mg/dl — ABNORMAL HIGH (ref 70–99)
POC Glucose: 117 mg/dl — ABNORMAL HIGH (ref 70–99)
POC Glucose: 102 mg/dl — ABNORMAL HIGH (ref 70–99)
POC Glucose: 91 mg/dl (ref 70–99)

## 2013-08-18 LAB — MAGNESIUM: Magnesium: 2 mg/dL (ref 1.80–2.40)

## 2013-08-18 LAB — PHOSPHORUS: Phosphorus: 3.3 mg/dL (ref 2.5–4.9)

## 2013-08-18 MED ADMIN — ondansetron (ZOFRAN) injection 4 mg: INTRAVENOUS | @ 03:00:00 | NDC 00641607801

## 2013-08-18 MED ADMIN — sodium chloride flush 0.9 % injection 10 mL: INTRAVENOUS | @ 03:00:00

## 2013-08-18 MED ADMIN — promethazine (PHENERGAN) injection 25 mg: INTRAVENOUS | @ 12:00:00 | NDC 00641092821

## 2013-08-18 MED ADMIN — gabapentin (NEURONTIN) capsule 300 mg: ORAL | @ 03:00:00 | NDC 53746010201

## 2013-08-18 MED ADMIN — pantoprazole (PROTONIX) injection 40 mg: INTRAVENOUS | @ 03:00:00 | NDC 00008400101

## 2013-08-18 MED ADMIN — PN-Adult Premix 5/20 - Standard Electrolytes - Central Line: INTRAVENOUS | @ 21:00:00 | NDC 63323014397

## 2013-08-18 MED ADMIN — promethazine (PHENERGAN) injection 25 mg: INTRAVENOUS | @ 20:00:00 | NDC 00641092821

## 2013-08-18 MED ADMIN — sodium chloride flush 0.9 % injection 10 mL: INTRAVENOUS | @ 13:00:00

## 2013-08-18 MED ADMIN — lactated ringers infusion: INTRAVENOUS | @ 17:00:00 | NDC 00338011704

## 2013-08-18 MED ADMIN — iohexol (OMNIPAQUE 350) solution 50 mL: ORAL | @ 19:00:00 | NDC 00407141489

## 2013-08-18 MED ADMIN — sodium chloride flush 0.9 % injection 10 mL: INTRAVENOUS | @ 07:00:00

## 2013-08-18 MED ADMIN — ondansetron (ZOFRAN) injection 4 mg: INTRAVENOUS | @ 16:00:00 | NDC 23155037831

## 2013-08-18 MED ADMIN — pantoprazole (PROTONIX) injection 40 mg: INTRAVENOUS | @ 13:00:00 | NDC 00008400101

## 2013-08-18 MED ADMIN — promethazine (PHENERGAN) injection 25 mg: INTRAVENOUS | @ 04:00:00 | NDC 00641092821

## 2013-08-18 MED ADMIN — sucralfate (CARAFATE) 1 GM/10ML suspension 1 g: ORAL | @ 03:00:00 | NDC 68094017159

## 2013-08-18 MED ADMIN — sucralfate (CARAFATE) 1 GM/10ML suspension 1 g: ORAL | @ 21:00:00 | NDC 68094017159

## 2013-08-18 MED ADMIN — ALPRAZolam (XANAX) tablet 0.5 mg: ORAL | @ 03:00:00 | NDC 68084067211

## 2013-08-18 MED ADMIN — enoxaparin (LOVENOX) injection 40 mg: SUBCUTANEOUS | @ 03:00:00 | NDC 00075801401

## 2013-08-18 MED FILL — PROTONIX 40 MG IV SOLR: 40 MG | INTRAVENOUS | Qty: 40

## 2013-08-18 MED FILL — LOVENOX 40 MG/0.4ML SC SOLN: 40 MG/0.4ML | SUBCUTANEOUS | Qty: 0.4

## 2013-08-18 MED FILL — ONDANSETRON HCL 4 MG/2ML IJ SOLN: 4 MG/2ML | INTRAMUSCULAR | Qty: 2

## 2013-08-18 MED FILL — ALPRAZOLAM 0.5 MG PO TABS: 0.5 MG | ORAL | Qty: 1

## 2013-08-18 MED FILL — NORMAL SALINE FLUSH 0.9 % IV SOLN: 0.9 % | INTRAVENOUS | Qty: 20

## 2013-08-18 MED FILL — NORMAL SALINE FLUSH 0.9 % IV SOLN: 0.9 % | INTRAVENOUS | Qty: 10

## 2013-08-18 MED FILL — LACTATED RINGERS IV SOLN: INTRAVENOUS | Qty: 1000

## 2013-08-18 MED FILL — HYDROMORPHONE HCL PF 1 MG/ML IJ SOLN: 1 MG/ML | INTRAMUSCULAR | Qty: 1

## 2013-08-18 MED FILL — CLINIMIX E/DEXTROSE (5/20) 5 % IV SOLN: 5 % | INTRAVENOUS | Qty: 2000

## 2013-08-18 MED FILL — MAG-AL PLUS 200-200-20 MG/5ML PO LIQD: 200-200-20 MG/5ML | ORAL | Qty: 30

## 2013-08-18 MED FILL — PROMETHAZINE HCL 25 MG/ML IJ SOLN: 25 MG/ML | INTRAMUSCULAR | Qty: 1

## 2013-08-18 MED FILL — NORMAL SALINE FLUSH 0.9 % IV SOLN: 0.9 % | INTRAVENOUS | Qty: 30

## 2013-08-18 MED FILL — SUCRALFATE 1 GM/10ML PO SUSP: 1 GM/0ML | ORAL | Qty: 10

## 2013-08-18 MED FILL — GABAPENTIN 300 MG PO CAPS: 300 MG | ORAL | Qty: 1

## 2013-08-18 NOTE — Progress Notes (Signed)
PM assessment complete and as charted in doc flow. Pt alert and oriented. Scheduled medications given per orders. Pt tolerated well. See eMAR. IVF infusing without difficulty with ordered TPN. Pt tolerating infusion well. Urinating without difficulty per report, states large BM following enema administration this shift. Updated on POC for this shift and agreeable at this time. Family at bedside. Denies any needs or complaints at present. Call light is within reach. Will continue to monitor.

## 2013-08-18 NOTE — Progress Notes (Signed)
Pt resting comfortably in bed. No needs at this time. Call light within reach. Will continue to monitor.

## 2013-08-18 NOTE — Progress Notes (Signed)
RECEIVED INTO PACU FROM ENDO. RESTING ON LEFT SIDE. SLEEPING. ASSESSMENT DONE

## 2013-08-18 NOTE — Plan of Care (Signed)
Problem: Falls - Risk of  Goal: Absence of falls  Outcome: Ongoing  Pt does not score as a high fall risk.  Standard fall precautions in place. Will reassess fall risk each shift and PRN.

## 2013-08-18 NOTE — Progress Notes (Signed)
PRN dilaudid given per request and MD orders. Pt tolerated well. See eMAR and doc flow. Will reassess and continue to monitor.

## 2013-08-18 NOTE — Progress Notes (Signed)
Medicated with prn dilaudid per order/request. See emar and flow sheet. Will monitor.

## 2013-08-18 NOTE — Progress Notes (Signed)
0400 insulin not given, as blood sugar was 117.  Primary nurse, Bethanie Dicker, RN notified.

## 2013-08-18 NOTE — Progress Notes (Signed)
To xray before back to floor.

## 2013-08-18 NOTE — Progress Notes (Signed)
Shift assessment complete; pt resting comfortably in bed.

## 2013-08-18 NOTE — Progress Notes (Signed)
Department of Family Practice  Adult Daily Progress Note      SUBJECTIVE      Feeling ok, still chest pain. Ate better yesterday and did not vomit.  Still no BM in many days.  Had MOM but no help.      OBJECTIVE    Physical  SpO2: 97 %     Patient Vitals for the past 24 hrs:   BP Temp Temp src Pulse Resp SpO2   08/18/13 0742 111/60 mmHg 98.2 ??F (36.8 ??C) Oral 73 16 97 %   08/18/13 0339 103/61 mmHg 98.1 ??F (36.7 ??C) Oral 67 16 93 %   08/17/13 2324 99/57 mmHg 98.4 ??F (36.9 ??C) Oral 67 16 97 %   08/17/13 1855 95/60 mmHg 97.8 ??F (36.6 ??C) Oral 73 16 96 %   08/17/13 1439 117/73 mmHg 98.2 ??F (36.8 ??C) Oral 68 16 99 %     Alert and oriented x 4 NAD, Obese, well hydrated, well developed.  Lung clear  CV rrr  No edema      Data    BMP:    Lab Results   Component Value Date    NA 139 08/18/2013    K 3.5 08/18/2013    CL 106 08/18/2013    CO2 27 08/18/2013    BUN 18 08/18/2013    LABALBU 3.2 08/05/2013    CREATININE 0.6 08/18/2013    CREATININE 0.8 08/05/2013    CALCIUM 7.9 08/18/2013    GFRAA >60 08/18/2013    GFRAA >60 03/16/2012    LABGLOM >60 08/18/2013    GLUCOSE 93 08/18/2013     Magnesium:    Lab Results   Component Value Date    MG 2.00 08/18/2013     Phosphorus:    Lab Results   Component Value Date    PHOS 3.3 08/18/2013       ASSESSMENT AND PLAN:    Esophageal stent removal today, hopefully helps with pain and will be ok home tomorrow.  supp and miralax for constipation.

## 2013-08-18 NOTE — Progress Notes (Signed)
Bedside handoff with Rosanna, RN. Pt resting in bed at this time. Pt does not express any needs at this time. Bed in lowest position and call light in reach

## 2013-08-18 NOTE — Progress Notes (Addendum)
[facility]  Continue to follow for home care.  [facility] [phone] in place for home infusion. Aware of pending discharge 9.13.14. Preliminary information faxed to [facility].   Will need to fax home TPN orders to [facility] upon discharge, if pt will return home with TPN.

## 2013-08-18 NOTE — Progress Notes (Signed)
Phenergan IVP administered per order/request by Georgina Snell, RN. See emar.

## 2013-08-18 NOTE — Progress Notes (Signed)
Pt back from endo, VSS, will continue to monitor

## 2013-08-18 NOTE — Progress Notes (Signed)
Returned to room, denies pain. Stead of feet. Report given to floor nurse.

## 2013-08-18 NOTE — Progress Notes (Signed)
New order received for soap suds enema per pt request.

## 2013-08-18 NOTE — Progress Notes (Signed)
Pt complaining of persistent constipation. Offered PRN suppository per MD orders. Does not feel suppository would be effective. Call placed to attending MD regarding pt request for enema-states this is what she does at home. Awaiting return call.

## 2013-08-18 NOTE — Op Note (Signed)
[facility]  Endoscopy Note    Patient: Rachael Mays  DOB: 1956-04-07  Acct#: [id number]    Procedure: Esophagogastroduodenoscopy with esophageal stent removal    Date:  08/18/2013     Surgeon:  Dollene Primrose, MD    Referring Physician:   Chiquita Loth, Zetta Bills    Preoperative Diagnosis:   Gastric fistula    Postoperative Diagnosis:   Gastric fistula    Anesthesia:  TIVA/MAC per anesthesia     Indications: This is a 57 y.o. 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  Here for stent removal.    Description of Procedure:  Informed consent was obtained from the patient after explanation of indications, benefits and possible risks and complications of the procedure.  The patient was then taken to the endoscopy suite, placed in the left lateral decubitus position and the above IV sedation was administrered.    The Olympus videoendoscope was placed in the patient's mouth and under direct visualization passed into the esophagus.  Visualization of the esophagus demonstrated the previously-placed esophageal stent in the mid esophagus.  The scope traversed the stent easily into the stomach     The scope was then advanced into the stomach.    Visualization of the gastric body and antrum demonstrated a white mucosal/submucosal exudate scattered throughout the stomach.  Biopsies were obtained.  A retroflexed exam of the gastric cardia and fundus demonstrated the esophageal stent traversing the GE junction.  The previously noted gastric fistula opening was seen in the cardia along the anterior wall/lesser curve side.  The pylorus was patent and the scope was advanced into the duodenum.  Visualization of the duodenal bulb demonstrated normal mucosa.  The second portion of the duodenum demonstrated normal mucosa.    The scope was then withdrawn back into the stomach, it was decompressed, and the scope was  withdrawn into the proximal esophagus proximal to the stent.    The proximal edge of the stent was embedded into the esophageal mucosa.  The drawstring suture was visualized and grasped with a grasping forceps passed through the scope.  The scope, forceps and stent were then completely withdrawn, removing the esophageal stent successfully.    The patient tolerated the procedure well and was taken to the post anesthesia care unit in good condition.    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     2) Water soluble contrast esophagram/UGI to re-evaluate gastric fistula.     3) Follow up as inpatient.     4) Can have a liquid diet if esophagram is favorable.    Dollene Primrose, MD  [facility]

## 2013-08-18 NOTE — Progress Notes (Signed)
Bedside handoff with Cody, RN. Pt resting in bed at this time. Pt does not express any needs at this time. Bed in lowest position and call light in reach

## 2013-08-18 NOTE — Progress Notes (Signed)
UP TO BR, STEADY ON FEET, PAST LG AMT FLATUS AND STATES SMALL FORMED STOOL

## 2013-08-18 NOTE — Progress Notes (Signed)
Scope number verified for HLD via label printout prior to use. H and P and consent completed. Verified using 2 person system.  Family in waiting room.

## 2013-08-18 NOTE — Anesthesia Pre-Procedure Evaluation (Signed)
Rachael Mays     Anesthesia Evaluation      Airway   Mallampati: II  TM distance: >3 FB  Neck ROM: full  Dental      Pulmonary - normal exam    breath sounds clear to auscultation  (+) asthma (last albuterol use several months ago),   ROS comment: DVT 12 yrs ago and PE 25 yrs ago, not on anticoag  Cardiovascular - normal exam  (+) hypertension,   (-) dysrhythmias, CHF    Rhythm: regular  Rate: normal  ROS comment: Cath 2013 coronaries WNL  No CP since  EF 60%  High cholesterol    Neuro/Psych    (-) seizures, TIA, CVA  Comments: "plates in neck" but FROM  GI/Hepatic/Renal    (+) GERD,     Comments: chronic nausea    Endo/Other    Abdominal                   Allergies: Talwin    NPO Status:  more than 8 hrs                            Anesthesia Plan    ASA 3     general     intravenous induction   Anesthetic plan and risks discussed with patient.    Plan discussed with CRNA.          Minerva Areola, MD  08/18/2013

## 2013-08-19 LAB — POCT GLUCOSE
POC Glucose: 137 mg/dl — ABNORMAL HIGH (ref 70–99)
POC Glucose: 145 mg/dl — ABNORMAL HIGH (ref 70–99)
POC Glucose: 107 mg/dl — ABNORMAL HIGH (ref 70–99)
POC Glucose: 95 mg/dl (ref 70–99)
POC Glucose: 110 mg/dl — ABNORMAL HIGH (ref 70–99)

## 2013-08-19 MED ORDER — ACYCLOVIR 5 % EX OINT
5 | Freq: Three times a day (TID) | CUTANEOUS | Status: DC
Start: 2013-08-19 — End: 2013-08-20
  Administered 2013-08-20 (×3): via TOPICAL

## 2013-08-19 MED ADMIN — cetirizine (ZYRTEC) tablet 10 mg: ORAL | @ 12:00:00 | NDC 51079059701

## 2013-08-19 MED ADMIN — citalopram (CELEXA) tablet 40 mg: ORAL | @ 12:00:00 | NDC 68084074411

## 2013-08-19 MED ADMIN — sodium chloride flush 0.9 % injection 10 mL: INTRAVENOUS | @ 02:00:00

## 2013-08-19 MED ADMIN — promethazine (PHENERGAN) injection 25 mg: INTRAVENOUS | @ 22:00:00 | NDC 00641092821

## 2013-08-19 MED ADMIN — gabapentin (NEURONTIN) capsule 300 mg: ORAL | @ 02:00:00 | NDC 53746010201

## 2013-08-19 MED ADMIN — enoxaparin (LOVENOX) injection 40 mg: SUBCUTANEOUS | @ 02:00:00 | NDC 00075801401

## 2013-08-19 MED ADMIN — insulin lispro (HUMALOG) injection pen 0-6 Units: SUBCUTANEOUS | @ 08:00:00 | NDC 00002879901

## 2013-08-19 MED ADMIN — promethazine (PHENERGAN) injection 25 mg: INTRAVENOUS | @ 06:00:00 | NDC 00641092821

## 2013-08-19 MED ADMIN — polyethylene glycol (GLYCOLAX) packet 17 g: ORAL | @ 12:00:00 | NDC 51079030601

## 2013-08-19 MED ADMIN — pantoprazole (PROTONIX) injection 40 mg: INTRAVENOUS | @ 12:00:00 | NDC 00008400101

## 2013-08-19 MED ADMIN — sucralfate (CARAFATE) 1 GM/10ML suspension 1 g: ORAL | @ 15:00:00 | NDC 00121074710

## 2013-08-19 MED ADMIN — pantoprazole (PROTONIX) injection 40 mg: INTRAVENOUS | @ 02:00:00 | NDC 00008400101

## 2013-08-19 MED ADMIN — sucralfate (CARAFATE) 1 GM/10ML suspension 1 g: ORAL | @ 05:00:00 | NDC 00121074710

## 2013-08-19 MED ADMIN — sodium chloride flush 0.9 % injection 10 mL: INTRAVENOUS | @ 19:00:00

## 2013-08-19 MED ADMIN — PN-Adult Premix 5/20 - Standard Electrolytes - Central Line: INTRAVENOUS | @ 22:00:00 | NDC 63323014397

## 2013-08-19 MED ADMIN — montelukast (SINGULAIR) tablet 10 mg: ORAL | @ 12:00:00 | NDC 68084062011

## 2013-08-19 MED ADMIN — sucralfate (CARAFATE) 1 GM/10ML suspension 1 g: ORAL | @ 10:00:00 | NDC 00121074710

## 2013-08-19 MED ADMIN — sodium chloride flush 0.9 % injection 10 mL: INTRAVENOUS | @ 15:00:00

## 2013-08-19 MED ADMIN — sodium chloride flush 0.9 % injection 10 mL: INTRAVENOUS | @ 12:00:00

## 2013-08-19 MED ADMIN — sodium chloride flush 0.9 % injection 10 mL: INTRAVENOUS | @ 22:00:00

## 2013-08-19 MED ADMIN — sodium chloride flush 0.9 % injection 10 mL: INTRAVENOUS | @ 10:00:00

## 2013-08-19 MED ADMIN — sucralfate (CARAFATE) 1 GM/10ML suspension 1 g: ORAL | @ 22:00:00 | NDC 68094017159

## 2013-08-19 MED ADMIN — ondansetron (ZOFRAN) injection 4 mg: INTRAVENOUS | @ 08:00:00 | NDC 00641607801

## 2013-08-19 MED FILL — NORMAL SALINE FLUSH 0.9 % IV SOLN: 0.9 % | INTRAVENOUS | Qty: 30

## 2013-08-19 MED FILL — PROMETHAZINE HCL 25 MG/ML IJ SOLN: 25 MG/ML | INTRAMUSCULAR | Qty: 1

## 2013-08-19 MED FILL — CLINIMIX E/DEXTROSE (5/20) 5 % IV SOLN: 5 % | INTRAVENOUS | Qty: 2000

## 2013-08-19 MED FILL — PROPOFOL 10 MG/ML IV EMUL: 10 MG/ML | INTRAVENOUS | Qty: 20

## 2013-08-19 MED FILL — HYDROMORPHONE HCL PF 1 MG/ML IJ SOLN: 1 MG/ML | INTRAMUSCULAR | Qty: 1

## 2013-08-19 MED FILL — ROXICET 5-325 MG/5ML PO SOLN: 5-325 MG/5ML | ORAL | Qty: 10

## 2013-08-19 MED FILL — NORMAL SALINE FLUSH 0.9 % IV SOLN: 0.9 % | INTRAVENOUS | Qty: 10

## 2013-08-19 MED FILL — SUCRALFATE 1 GM/10ML PO SUSP: 1 GM/0ML | ORAL | Qty: 10

## 2013-08-19 MED FILL — LACTATED RINGERS IV SOLN: INTRAVENOUS | Qty: 1000

## 2013-08-19 MED FILL — XYLOCAINE 2 % IJ SOLN: 2 % | INTRAMUSCULAR | Qty: 10

## 2013-08-19 MED FILL — METOCLOPRAMIDE HCL 5 MG/ML IJ SOLN: 5 MG/ML | INTRAMUSCULAR | Qty: 2

## 2013-08-19 MED FILL — ONDANSETRON HCL 4 MG/2ML IJ SOLN: 4 MG/2ML | INTRAMUSCULAR | Qty: 2

## 2013-08-19 MED FILL — CETIRIZINE HCL 10 MG PO TABS: 10 MG | ORAL | Qty: 1

## 2013-08-19 MED FILL — PROTONIX 40 MG IV SOLR: 40 MG | INTRAVENOUS | Qty: 40

## 2013-08-19 MED FILL — LOVENOX 40 MG/0.4ML SC SOLN: 40 MG/0.4ML | SUBCUTANEOUS | Qty: 0.4

## 2013-08-19 MED FILL — MAG-AL PLUS 200-200-20 MG/5ML PO LIQD: 200-200-20 MG/5ML | ORAL | Qty: 30

## 2013-08-19 MED FILL — GABAPENTIN 300 MG PO CAPS: 300 MG | ORAL | Qty: 1

## 2013-08-19 MED FILL — CITALOPRAM HYDROBROMIDE 20 MG PO TABS: 20 MG | ORAL | Qty: 2

## 2013-08-19 MED FILL — POLYETHYLENE GLYCOL 3350 17 G PO PACK: 17 g | ORAL | Qty: 1

## 2013-08-19 MED FILL — SINGULAIR 10 MG PO TABS: 10 MG | ORAL | Qty: 1

## 2013-08-19 NOTE — Progress Notes (Signed)
Pt given her morning medications. Pt rates pain 3/10 and denies wanting available pain medication. Pt denies any other needs at this time. Pt states she wants to sleep some more and would like this RN to change the dressing on her abdomen later.

## 2013-08-19 NOTE — Progress Notes (Signed)
Paging on call MD for lip balm, Abreva for pts cold sore on her lip, and some sort of mouth rinse to help with a sore developing on her bottom, inside of lip. Will await return phone call from Dr. Thora Lance.

## 2013-08-19 NOTE — Progress Notes (Signed)
Scheduled medications given per orders. Pt tolerated well. See eMAR. Routine VSS, see doc flow. TPN infusing without difficulty per orders. Will titrate as appropriate when indicated. Lemon-lime soda provided per pt request. Tolerating clear liquids well at this time. Denies needs. Will monitor.

## 2013-08-19 NOTE — Progress Notes (Signed)
[facility]  Gastroenterology Progress Note    Rachael Mays is a 57 y.o. female patient.  Active Problems:    * No active hospital problems. *      SUBJECTIVE:  Feels better.  No nausea or vomiting.  Denies pain.    ROS:  Denies CP or SOB    Gastrointestinal ROS: no abdominal pain, change in bowel habits, or black or bloody stools    Physical    VITALS:  BP 108/61   Pulse 63   Temp(Src) 98.1 ??F (36.7 ??C) (Oral)   Resp 16   Ht 5\' 6"  (1.676 m)   Wt 215 lb (97.523 kg)   BMI 34.72 kg/m2   SpO2 96%  TEMPERATURE:  Current - Temp: 98.1 ??F (36.7 ??C); Max - Temp  Avg: 98.1 ??F (36.7 ??C)  Min: 97.4 ??F (36.3 ??C)  Max: 98.6 ??F (37 ??C)    NAD  RRR, Nl s1s2  Lungs CTA Bilaterally, normal effort  Abdomen soft, ND, NT, no HSM, Bowel sounds normal.  Dressing/packing in LUQ wound intact.    Data    Data Review:    Recent Labs      08/17/13   0649   WBC  6.4   HGB  10.6*   HCT  32.5*   MCV  84.7   PLT  200     Recent Labs      08/17/13   0649  08/18/13   0813   NA  137  139   K  3.9  3.5   CL  103  106   CO2  27  27   PHOS  3.5  3.3   BUN  22*  18   CREATININE  0.6  0.6     No results found for this basename: AST, ALT, ALB, BILIDIR, BILITOT, ALKPHOS,  in the last 72 hours  No results found for this basename: LIPASE, AMYLASE,  in the last 72 hours  No results found for this basename: PROTIME, INR,  in the last 72 hours  No results found for this basename: PTT,  in the last 72 hours    Radiology Review:  Esophagram/UGI after stent removal:    Following ingestion of contrast material, note was made   of prompt advancement through the distal esophagus and into the   stomach without delay. There is mucosal irregularity at the   previous site of the proximal most aspect of the expandable mesh   stent. There is however, no evidence of extravasation of contrast   material from this site or more distally at the site of prior   esophageal leak. In addition, the patient described no esophageal   discomfort during this study stating that the  previous constant   pain through this region subsided immediately upon removal of the   stent. Note is again made of an indwelling inferior vena cava   filter.           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      PLAN  :  1) Soft diet as tolerated.     2) Agree with discharge when tolerating po.  Can f/u with me as needed.    Kandice Robinsons, MD  [facility]  08/19/2013

## 2013-08-19 NOTE — Progress Notes (Signed)
Bedside handoff report given to [place], [place]. Pt denies any needs at this time. Call light in reach.

## 2013-08-19 NOTE — Progress Notes (Signed)
Scheduled medications given per orders without difficulty. Pt tolerated well. See eMAR and doc flow. TPN continues to infuse without difficulty per orders. Pt tolerating well. FSBS 116, no SSI coverage indicated at this time. Will monitor.

## 2013-08-19 NOTE — Progress Notes (Signed)
PM assessment complete and as charted in doc flow. Pt alert and oriented. IVF infusing without difficulty with ordered TPN. Pt tolerating infusion well. Updated on POC for this shift and agreeable at this time. Denies any pain, discomfort, needs or complaints at present. Call light is within reach. Will continue to monitor.

## 2013-08-19 NOTE — Plan of Care (Signed)
Problem: Nausea/Vomiting  Goal: Absence of nausea/vomiting  Pt will remain free from s/s nausea/vomiting; tolerate prescribed anti-emetics as needed. Will tolerate diet advanced as appropriate per POC.     Problem: Pain  Goal: Control of acute pain  Pain/discomfort being managed with PRN analgesics per MD orders. Pt able to express presence and absence of pain and rate pain appropriately using numerical scale.

## 2013-08-19 NOTE — Progress Notes (Signed)
Rachael Mays is a 57 y.o. female patient.    Current Facility-Administered Medications   Medication Dose Route Frequency Provider Last Rate Last Dose   ??? bisacodyl (DULCOLAX) suppository 10 mg  10 mg Rectal Daily PRN Galen Manila, MD       ??? polyethylene glycol (GLYCOLAX) packet 17 g  17 g Oral Daily Galen Manila, MD       ??? magnesium hydroxide (MILK OF MAGNESIA) 400 MG/5ML suspension 30 mL  30 mL Oral Daily PRN Fanny Dance, MD   30 mL at 08/17/13 1558   ??? aluminum & magnesium hydroxide-simethicone (MAALOX) 200-200-20 MG/5ML suspension 15 mL  15 mL Oral Q1H PRN Fanny Dance, MD   15 mL at 08/19/13 0419   ??? alteplase (CATHFLO) injection 2 mg  2 mg Intracatheter Once Guss Bunde, MD       ??? oxyCODONE-acetaminophen (ROXICET) 5-325 MG/5ML solution 10 mL  10 mL Oral Q4H PRN Galen Manila, MD   10 mL at 08/16/13 1006   ??? pantoprazole (PROTONIX) injection 40 mg  40 mg Intravenous BID Durene Romans O'Toole Jr., DO   40 mg at 08/18/13 2144   ??? sucralfate (CARAFATE) 1 GM/10ML suspension 1 g  1 g Oral Q6H SCH Terrance M O'Toole Jr., DO   1 g at 08/19/13 1610   ??? insulin lispro (HUMALOG) injection pen 0-6 Units  0-6 Units Subcutaneous Q6H Lum Keas, MD   1 Units at 08/19/13 0420   ??? albuterol (PROVENTIL HFA;VENTOLIN HFA) 108 (90 BASE) MCG/ACT inhaler 2 puff  2 puff Inhalation Q6H PRN Lum Keas, MD       ??? ALPRAZolam Prudy Feeler) tablet 0.5 mg  0.5 mg Oral BID PRN Lum Keas, MD   0.5 mg at 08/17/13 2245   ??? cetirizine (ZYRTEC) tablet 10 mg  10 mg Oral Daily Lum Keas, MD   10 mg at 08/17/13 9604   ??? cloNIDine (CATAPRES) 0.1 MG/24HR 1 patch  1 patch Transdermal Weekly Lum Keas, MD   1 patch at 08/13/13 1000   ??? citalopram (CELEXA) tablet 40 mg  40 mg Oral Daily Lum Keas, MD   40 mg at 08/17/13 5409   ??? gabapentin (NEURONTIN) capsule 300 mg  300 mg Oral Nightly Lum Keas, MD   300 mg at 08/18/13 2143   ??? montelukast  (SINGULAIR) tablet 10 mg  10 mg Oral Daily Lum Keas, MD   10 mg at 08/17/13 0934   ??? scopolamine (TRANSDERM-SCOP) 1.5 MG 1 patch  1 patch Transdermal Q72H Lum Keas, MD   1 patch at 08/17/13 8119   ??? sodium chloride flush 0.9 % injection 10 mL  10 mL Intravenous Q12H [facility] Lum Keas, MD   10 mL at 08/18/13 2144   ??? sodium chloride flush 0.9 % injection 10 mL  10 mL Intravenous PRN Lum Keas, MD   10 mL at 08/19/13 0615   ??? acetaminophen (TYLENOL) tablet 650 mg  650 mg Oral Q4H PRN Lum Keas, MD       ??? ondansetron [facility]) injection 4 mg  4 mg Intravenous Q6H PRN Lum Keas, MD   4 mg at 08/19/13 1478   ??? promethazine (PHENERGAN) injection 25 mg  25 mg Intravenous Q6H PRN Lum Keas, MD   25 mg at 08/19/13 2956   ???  HYDROmorphone HCl PF (DILAUDID) injection SOLN 1 mg  1 mg Intravenous Q4H PRN Lum Keas, MD   1 mg at 08/19/13 0272   ??? enoxaparin (LOVENOX) injection 40 mg  40 mg Subcutaneous Nightly Lum Keas, MD   40 mg at 08/18/13 2144     Facility-Administered Medications Ordered in Other Encounters   Medication Dose Route Frequency Provider Last Rate Last Dose   ??? lactated ringers infusion   Intravenous Continuous Lu Duffel, MD 100 mL/hr at 08/18/13 1301       Allergies   Allergen Reactions   ??? Talwin [Pentazocine] Other (See Comments)     dizzy     Active Problems:    * No active hospital problems. *    Blood pressure 110/65, pulse 97, temperature 98.1 ??F (36.7 ??C), temperature source Oral, resp. rate 16, height 5\' 6"  (1.676 m), weight 215 lb (97.523 kg), SpO2 97 %.    Subjective:  Symptoms:  (Pt feels much better since removal of stent yest-less pain and nausea; no vomiting  Was constipated-got results from meds.).      Objective:  General Appearance:  In no acute distress.    Vital signs: (most recent): Blood pressure 110/65, pulse 97, temperature 98.1 ??F (36.7 ??C), temperature  source Oral, resp. rate 16, height 5\' 6"  (1.676 m), weight 215 lb (97.523 kg), SpO2 97 %.  Vital signs are normal.  No fever.    Output: Producing urine and producing stool.    HEENT: Normal HEENT exam.    Lungs:  Normal effort.  Breath sounds clear to auscultation.    Heart: Normal rate.    Abdomen: Abdomen is soft and non-distended.    Pulses: Distal pulses are intact.      Assessment:  (Clinically improved.    Will advance diet.  Pt has been in bed for weeks-will have PT work c pt for ambulation.).       Guss Bunde, MD  08/19/2013

## 2013-08-19 NOTE — Progress Notes (Signed)
Pt resting in bed, family is going to bring her dinner. Pt denies any other needs at this time.

## 2013-08-19 NOTE — Progress Notes (Signed)
PRN dilaudid and phenergan given per request and MD orders. Pt tolerated well. See eMAR and doc flow. Will reassess and continue to monitor.

## 2013-08-19 NOTE — Progress Notes (Signed)
Pt resting in bed and denies any needs at this time.  Pt states she is comfortable.

## 2013-08-19 NOTE — Progress Notes (Signed)
Patient is s/p stent removal.  Pain is significantly decreased.  Tolerating liquids.    Filed Vitals:    08/19/13 0747   BP: 93/56   Pulse: 66   Temp: 98 ??F (36.7 ??C)   Resp: 14     AbD: soft nt/nd.  Wound continues to improve.      AP:  S/p stent removal for leak following band removal    Doing well  Ok to d/c home when tolerating liquids/pain controlled.    Will have her f/u in my office in 2 weeks.

## 2013-08-19 NOTE — Progress Notes (Signed)
Call received from Attending MD regarding pt c/o cold sore area to mouth. New order received. See orders.

## 2013-08-19 NOTE — Progress Notes (Signed)
Pt resting in bed, denies any current needs. Call light in reach.

## 2013-08-20 LAB — POCT GLUCOSE
POC Glucose: 116 mg/dl — ABNORMAL HIGH (ref 70–99)
POC Glucose: 150 mg/dl — ABNORMAL HIGH (ref 70–99)
POC Glucose: 106 mg/dl — ABNORMAL HIGH (ref 70–99)

## 2013-08-20 MED ORDER — ACETAMINOPHEN 650 MG PO TABS
650 MG | ORAL_TABLET | ORAL | Status: DC | PRN
Start: 2013-08-20 — End: 2015-01-13

## 2013-08-20 MED ORDER — GABAPENTIN 300 MG PO CAPS
300 MG | ORAL_CAPSULE | ORAL | Status: DC
Start: 2013-08-20 — End: 2013-08-20

## 2013-08-20 MED ORDER — ESCITALOPRAM OXALATE 20 MG PO TABS
20 MG | ORAL_TABLET | Freq: Every evening | ORAL | Status: DC
Start: 2013-08-20 — End: 2013-08-21

## 2013-08-20 MED ADMIN — polyethylene glycol (GLYCOLAX) packet 17 g: ORAL | @ 13:00:00 | NDC 51079030601

## 2013-08-20 MED ADMIN — gabapentin (NEURONTIN) capsule 300 mg: ORAL | @ 01:00:00 | NDC 53746010201

## 2013-08-20 MED ADMIN — scopolamine (TRANSDERM-SCOP) 1.5 MG 1 patch: TRANSDERMAL | @ 13:00:00 | NDC 10019055301

## 2013-08-20 MED ADMIN — citalopram (CELEXA) tablet 40 mg: ORAL | @ 13:00:00 | NDC 68084074411

## 2013-08-20 MED ADMIN — ondansetron (ZOFRAN) injection 4 mg: INTRAVENOUS | @ 11:00:00 | NDC 23155037831

## 2013-08-20 MED ADMIN — enoxaparin (LOVENOX) injection 40 mg: SUBCUTANEOUS | @ 01:00:00 | NDC 00075801401

## 2013-08-20 MED ADMIN — sodium chloride flush 0.9 % injection 10 mL: INTRAVENOUS | @ 15:00:00

## 2013-08-20 MED ADMIN — promethazine (PHENERGAN) injection 25 mg: INTRAVENOUS | @ 06:00:00 | NDC 00641092821

## 2013-08-20 MED ADMIN — cetirizine (ZYRTEC) tablet 10 mg: ORAL | @ 13:00:00 | NDC 51079059701

## 2013-08-20 MED ADMIN — sodium chloride flush 0.9 % injection 10 mL: INTRAVENOUS | @ 13:00:00

## 2013-08-20 MED ADMIN — sodium chloride flush 0.9 % injection 10 mL: INTRAVENOUS | @ 01:00:00

## 2013-08-20 MED ADMIN — insulin lispro (HUMALOG) injection pen 0-6 Units: SUBCUTANEOUS | @ 09:00:00 | NDC 00002879901

## 2013-08-20 MED ADMIN — sucralfate (CARAFATE) 1 GM/10ML suspension 1 g: ORAL | @ 11:00:00 | NDC 68094017159

## 2013-08-20 MED ADMIN — cloNIDine (CATAPRES) 0.1 MG/24HR 1 patch: TRANSDERMAL | @ 13:00:00 | NDC 00555100916

## 2013-08-20 MED ADMIN — sucralfate (CARAFATE) 1 GM/10ML suspension 1 g: ORAL | @ 04:00:00 | NDC 00121074710

## 2013-08-20 MED ADMIN — montelukast (SINGULAIR) tablet 10 mg: ORAL | @ 13:00:00 | NDC 68084062011

## 2013-08-20 MED ADMIN — sucralfate (CARAFATE) 1 GM/10ML suspension 1 g: ORAL | @ 19:00:00 | NDC 68094017159

## 2013-08-20 MED ADMIN — pantoprazole (PROTONIX) injection 40 mg: INTRAVENOUS | @ 01:00:00 | NDC 00008400101

## 2013-08-20 MED ADMIN — pantoprazole (PROTONIX) injection 40 mg: INTRAVENOUS | @ 13:00:00 | NDC 00008400101

## 2013-08-20 MED FILL — PROMETHAZINE HCL 25 MG/ML IJ SOLN: 25 MG/ML | INTRAMUSCULAR | Qty: 1

## 2013-08-20 MED FILL — PROTONIX 40 MG IV SOLR: 40 MG | INTRAVENOUS | Qty: 40

## 2013-08-20 MED FILL — NORMAL SALINE FLUSH 0.9 % IV SOLN: 0.9 % | INTRAVENOUS | Qty: 20

## 2013-08-20 MED FILL — MAG-AL PLUS 200-200-20 MG/5ML PO LIQD: 200-200-20 MG/5ML | ORAL | Qty: 30

## 2013-08-20 MED FILL — NORMAL SALINE FLUSH 0.9 % IV SOLN: 0.9 % | INTRAVENOUS | Qty: 10

## 2013-08-20 MED FILL — HYDROMORPHONE HCL PF 1 MG/ML IJ SOLN: 1 MG/ML | INTRAMUSCULAR | Qty: 1

## 2013-08-20 MED FILL — CETIRIZINE HCL 10 MG PO TABS: 10 MG | ORAL | Qty: 1

## 2013-08-20 MED FILL — LACTATED RINGERS IV SOLN: INTRAVENOUS | Qty: 1000

## 2013-08-20 MED FILL — TRANSDERM-SCOP (1.5 MG) 1 MG/3DAYS TD PT72: 1 MG/3DAYS | TRANSDERMAL | Qty: 1

## 2013-08-20 MED FILL — LOVENOX 40 MG/0.4ML SC SOLN: 40 MG/0.4ML | SUBCUTANEOUS | Qty: 0.4

## 2013-08-20 MED FILL — ACYCLOVIR 5 % EX OINT: 5 % | CUTANEOUS | Qty: 15

## 2013-08-20 MED FILL — SUCRALFATE 1 GM/10ML PO SUSP: 1 GM/0ML | ORAL | Qty: 10

## 2013-08-20 MED FILL — SINGULAIR 10 MG PO TABS: 10 MG | ORAL | Qty: 1

## 2013-08-20 MED FILL — POLYETHYLENE GLYCOL 3350 17 G PO PACK: 17 g | ORAL | Qty: 1

## 2013-08-20 MED FILL — GABAPENTIN 300 MG PO CAPS: 300 MG | ORAL | Qty: 1

## 2013-08-20 MED FILL — CLONIDINE HCL 0.1 MG/24HR TD PTWK: 0.1 MG/24HR | TRANSDERMAL | Qty: 1

## 2013-08-20 MED FILL — ONDANSETRON HCL 4 MG/2ML IJ SOLN: 4 MG/2ML | INTRAMUSCULAR | Qty: 2

## 2013-08-20 MED FILL — CITALOPRAM HYDROBROMIDE 20 MG PO TABS: 20 MG | ORAL | Qty: 2

## 2013-08-20 NOTE — Discharge Instructions (Addendum)
[facility]  [phone]  [facility] [phone] for IV medication and supplies  My nurse asked me if I needed any help at home._______________________     My nurse or social worker helped me with my discharge needs._______________    My nurse reviewed with me all of the signs and symptoms to look for when I go home.____________  Your information:  Name: Rachael Mays  DOB: 03-03-1956    Your instructions:      What to do after you leave the hospital:    Recommended diet: general diet as tolerated    Recommended activity: activity as tolerated    If you experience any of the following symptoms persistent nausea or vomiting, pain or nausea not controlled by medication, chills or a fever greater than 100.4 degrees, inability to eat or drink regularly, please follow up with Dr. Daphine Deutscher and your primary care physician.    Follow-up with Dr. Daphine Deutscher as needed.  Call to make your follow-up appointment to see your primary care physician in one week.     The following personal items were collected during your admission and were returned to you:    Valuables  Dentures: None  Vision - Corrective Lenses: Glasses  Hearing Aid: None  Jewelry: None  Body Piercings Removed: N/A  Clothing: Dress  Were All Patient Medications Collected?: Not Applicable  Other Valuables: Purse  Valuables Given To: Patient, Family (Comment)  Provide Name(s) of Who Valuable(s) Were Given To: patient  Responsible person(s) in the waiting room?: n/a    Information obtained by:  By signing below, I understand that if any problems occur once I leave the hospital I am to contact my primary care physician.  I understand and acknowledge receipt of the instructions indicated above.

## 2013-08-20 NOTE — Progress Notes (Signed)
Scheduled medications given per orders. Pt tolerated well. TPN continues to infuse per orders. Visitors at bedside. Pt denies needs. Call light is within reach, will monitor.

## 2013-08-20 NOTE — Progress Notes (Signed)
Pt resting in bed, eyes closed, breathing easy and even. Call light in reach.

## 2013-08-20 NOTE — Progress Notes (Signed)
FSBS 150 per POCT. SSI insulin provided per orders. Pt tolerated well. See eMAR.

## 2013-08-20 NOTE — Progress Notes (Signed)
Reviewed discharge instructions and medications, both verbal and written, with patient. Pt verbalized understanding. Charge RN set up home health care, paperwork faxed. Home supplies sent with pt for wound care change of dressing.

## 2013-08-20 NOTE — Discharge Summary (Signed)
PATIENT NAME:                 PA #:            MR #JONICE, CERRA                 [id number]       [id number]            ATTENDING PHYSICIAN:                  ADM DATE:   DIS DATE:          Lavena Bullion SLOUGH, MD              08/05/2013  08/20/2013         DATE OF BIRTH:   AGE:           PATIENT TYPE:                         15-Apr-1956       57             IPF                                      ADMISSION DIAGNOSIS:  Intractable abdominal pain, intractable nausea and  vomiting, known perforation of the stomach, esophageal stent placed, status  post removal of eroding Lap-Band.      HOSPITAL COURSE:  This 57 year old female was treated in the hospital.  She  had nightly TPN performed.  She was given IV pain medicine, IV anti-emetics.   She had her stent removed several days prior to discharge and began  swallowing and eating well after that time.  Her electrolytes were normal  during the hospital course.  Blood sugars ranged from 95 to 116.  Her  hemoglobin was 10.6.  She underwent an esophagram after the esophageal stent  removal and at that time she had no evidence of any contrast leakage from  either the esophagus or from the known perforation of the stomach.  The  patient, at the time of discharge, is beginning to eat well.  She has a  couple of bags of TPN at home and since her eating has been inconsistent, we  will have her finish these bags.  Follow up in the office in several days.   If still doing well at that time, we plan that we will be able to stop her  TPN at that time.  She states she was taking her gabapentin for neck pain and  that resolved with the removal of the Lap-Band.  She is to use Tylenol or her  Roxicet for her pain.  She will be on Lexapro 40 mg a day, a transdermal  scopolamine patch.  She has Phenergan and Zofran to use for nausea.  She has  Carafate tablets that she is to take 4 times a day and omeprazole 40 mg a  day.  She has an albuterol inhaler to take as needed.   We stopped her  clonidine patch on discharge as her blood pressure is running around 100  systolic.  We also stopped her Lovenox at discharge. She is to follow up with  Dr. Mack Hook in several days in the office.  CONSULTATIONS DURING THE HOSPITAL COURSE:  Dr. Daphine Deutscher for GI and Dr. Suzi Roots  for surgery.  Dr. Daphine Deutscher performed the esophageal stent removal.  If she is  tolerating her oral diet, not only can her TPN be stopped, but we can also  pull her PICC line.                                             Virl Cagey, MD     [id number]  DD: 08/20/2013 09:14   DT: 08/20/2013 09:58   Job #: [id number]  CC: Virl Cagey, MD  CC: Harold Barban, MD

## 2013-08-20 NOTE — Progress Notes (Signed)
Pt sitting up in bed watching tv. She denies any current needs. Call light in reach.

## 2013-08-20 NOTE — Plan of Care (Signed)
Problem: Nausea/Vomiting  Goal: Absence of nausea/vomiting  Pt will remain free from s/s nausea/vomiting; tolerate prescribed anti-emetics as needed. Will tolerate diet advanced as appropriate per POC.    Problem: Pain  Goal: Reduction in pain sensation  Pain/discomfort being managed with PRN analgesics per MD orders. Pt able to express presence and absence of pain and rate pain appropriately using numerical scale.

## 2013-08-20 NOTE — Progress Notes (Signed)
Pt sitting up in bed watching tv. Pt states as soon as her husband wakes up he calls her (he works 3rd shift). She will then let me know when she is able to be picked up for discharge. picc line dressing was changed by this RN yesterday, using sterile procedure. picc line dressing due to be changed 9-20.

## 2013-08-20 NOTE — Progress Notes (Signed)
PRN dilaudid and phenergan given per request and MD orders. Pt tolerated well. See eMAR and doc flow. Routine VSS, see doc flow. TPN infusing, denies other needs at this time. Call light is within reach. Will monitor.

## 2013-08-20 NOTE — Progress Notes (Signed)
Physical Therapy  To pt room for PT eval.  Pt states she is ready to go home and will be getting Home care.  Feels she does not need PT.  PT spoke with charge nurse to confirm. Will discharge PT orders.  Thank you,  Renea Ee, PT [phone]

## 2013-08-20 NOTE — Progress Notes (Signed)
Faxed signed COC and TPN order today to [facility]. Discharging today.

## 2013-08-21 LAB — COMPREHENSIVE METABOLIC PANEL
ALT: 128 U/L — ABNORMAL HIGH (ref 10–40)
AST: 99 U/L — ABNORMAL HIGH (ref 15–37)
Albumin/Globulin Ratio: 1 — ABNORMAL LOW (ref 1.1–2.2)
Albumin: 3.2 g/dL — ABNORMAL LOW (ref 3.4–5.0)
Alkaline Phosphatase: 146 U/L — ABNORMAL HIGH (ref 40–129)
BUN: 20 mg/dL (ref 7–20)
CO2: 26 mmol/L (ref 21–32)
Calcium: 9.1 mg/dL (ref 8.3–10.6)
Chloride: 103 mmol/L (ref 99–110)
Creatinine: 0.7 mg/dL (ref 0.6–1.1)
GFR African American: >60 (ref >60)
GFR Non-African American: >60 (ref >60)
Globulin: 3.1 g/dL
Glucose: 101 mg/dL — ABNORMAL HIGH (ref 70–99)
Potassium: 4.1 mmol/L (ref 3.5–5.1)
Sodium: 138 mmol/L (ref 136–145)
Total Bilirubin: <0.2 mg/dL (ref 0.0–1.0)
Total Protein: 6.3 g/dL — ABNORMAL LOW (ref 6.4–8.2)

## 2013-08-21 LAB — CBC WITH AUTO DIFFERENTIAL
Basophils %: 0.8 %
Basophils Absolute: 0 10*3/uL (ref 0.0–0.2)
Eosinophils %: 5.1 %
Eosinophils Absolute: 0.3 10*3/uL (ref 0.0–0.6)
Hematocrit: 34.2 % — ABNORMAL LOW (ref 36.0–48.0)
Hemoglobin: 11.1 g/dL — ABNORMAL LOW (ref 12.0–16.0)
Lymphocytes %: 32.8 %
Lymphocytes Absolute: 2 10*3/uL (ref 1.0–5.1)
MCH: 27.1 pg (ref 26.0–34.0)
MCHC: 32.6 g/dL (ref 31.0–36.0)
MCV: 83.3 fL (ref 80.0–100.0)
MPV: 9.3 fL (ref 5.0–10.5)
Monocytes %: 9.8 %
Monocytes Absolute: 0.6 10*3/uL (ref 0.0–1.3)
Neutrophils %: 51.5 %
Neutrophils Absolute: 3.2 10*3/uL (ref 1.7–7.7)
Platelets: 219 10*3/uL (ref 135–450)
RBC: 4.11 M/uL (ref 4.00–5.20)
RDW: 14.1 % (ref 12.4–15.4)
WBC: 6.2 10*3/uL (ref 4.0–11.0)

## 2013-08-21 LAB — OSMOLALITY: Osmolality: 301 mOsm/kg — ABNORMAL HIGH (ref 275–295)

## 2013-08-21 LAB — MAGNESIUM: Magnesium: 2 mg/dL (ref 1.80–2.40)

## 2013-08-21 LAB — PREALBUMIN: Prealbumin: 18.6 mg/dL — ABNORMAL LOW (ref 20.0–40.0)

## 2013-08-21 LAB — PHOSPHORUS: Phosphorus: 3.8 mg/dL (ref 2.5–4.9)

## 2013-08-21 MED ORDER — ESCITALOPRAM OXALATE 20 MG PO TABS
20 MG | ORAL_TABLET | ORAL | Status: DC
Start: 2013-08-21 — End: 2014-01-18

## 2013-08-21 MED FILL — LACTATED RINGERS IV SOLN: INTRAVENOUS | Qty: 1000

## 2013-08-22 LAB — TRANSFERRIN: Transferrin: 280 mg/dL (ref 200.0–360.0)

## 2013-08-22 LAB — TRIGLYCERIDES: Triglycerides: 153 mg/dL — ABNORMAL HIGH (ref 0–150)

## 2013-08-22 MED ORDER — ALPRAZOLAM 0.5 MG PO TABS
0.5 MG | ORAL_TABLET | Freq: Two times a day (BID) | ORAL | Status: DC | PRN
Start: 2013-08-22 — End: 2013-09-18

## 2013-08-22 MED FILL — LACTATED RINGERS IV SOLN: INTRAVENOUS | Qty: 1000

## 2013-08-22 NOTE — Progress Notes (Signed)
Patient is here for hospital follow up, lap band was eroding stomach and esophagus, feeling very weak, has the shakes, and nauseous. Depression is worsening.

## 2013-08-22 NOTE — Progress Notes (Signed)
Subjective:      Patient ID: Rachael Mays is a 57 y.o. female.    HPI   Patient here for hospital follow up after admission from 08/05/2013-08/20/2013 with intractable abdominal pina, nausea, and emesis.  Symptoms after lap band removal with complication of perforation of stomach requiring stent placement.  Before discharge, esophageal stent was removed.  She was sent home on TPN and po as tolerated.      Abdominal pain resolved with removal of stent.  Tolerating diet including sandwiches, soup, chips.  Depressed with prolonged recovery but lots of support at home from daughters and husband.  Tearful the night she came home from hospital but this has improved.  No SI.      Patient Active Problem List   Diagnosis   ??? Back Pain   ??? Deep Vein Thrombosis   ??? Depression   ??? High Cholesterol   ??? Hypertension   ??? Pulmonary Embolism   ??? Nausea & vomiting   ??? Abdominal  pain, other specified site   ??? Morbid obesity   ??? Status post gastric banding   ??? Vertigo   ??? Dizziness   ??? Tinnitus, subjective   ??? Meniere syndrome   ??? Complication of gastric banding   ??? Wound infection (HCC)   ??? HTN (hypertension)   ??? Hyperglycemia       Outpatient Prescriptions Marked as Taking for the 08/22/13 encounter (Office Visit) with Lum Keas, MD   Medication Sig Dispense Refill   ??? escitalopram (LEXAPRO) 20 MG tablet Take one tablet daily  30 tablet  3   ??? acetaminophen 650 MG TABS Take 650 mg by mouth every 4 hours as needed.  120 tablet  3   ??? oxyCODONE-acetaminophen (ROXICET) 5-325 MG/5ML solution 5-10 mL Q4 hr prn pain  300 mL  0   ??? scopolamine (TRANSDERM-SCOP) 1.5 MG Place 1 patch onto the skin every 72 hours.  3 patch  12   ??? promethazine (PHENERGAN) 25 MG tablet Take 1 tablet by mouth every 6 hours as needed for Nausea.  40 tablet  0   ??? magnesium hydroxide (MILK OF MAGNESIA) 400 MG/5ML suspension Take 30 mLs by mouth daily as needed for Constipation.       ??? sucralfate (CARAFATE) 1 GM tablet Take 1 tablet by mouth every 6  hours.  120 tablet  3   ??? omeprazole (PRILOSEC) 40 MG capsule Take 1 capsule by mouth daily.  30 capsule  3   ??? NONFORMULARY TPN overnight per picc line       ??? ondansetron (ZOFRAN) 4 MG tablet Take 1 tablet by mouth every 8 hours as needed for Nausea or Vomiting.  30 tablet  0   ??? montelukast (SINGULAIR) 10 MG tablet Take 1 tablet by mouth daily.  30 tablet  3   ??? fluocinonide (LIDEX) 0.05 % cream Apply topically 2 times daily.  1 Tube  1   ??? Wound Dressings (INTERDRY AG TEXTILE 10"X12') MISC Apply 1 each topically daily. Pt would like a call with price before filled  1 each  1   ??? cetirizine (ZYRTEC ALLERGY) 10 MG tablet Take 10 mg by mouth daily.       ??? ALPRAZolam (XANAX) 0.5 MG tablet Take 1 tablet by mouth 2 times daily as needed for Sleep or Anxiety.  60 tablet  0   ??? albuterol (PROVENTIL;VENTOLIN) 90 MCG/ACT inhaler Inhale 2 puffs into the lungs every 6 hours as needed.  Allergies   Allergen Reactions   ??? Talwin [Pentazocine] Other (See Comments)     dizzy       History   Substance Use Topics   ??? Smoking status: Former Smoker -- 0.50 packs/day for 35 years     Types: Cigarettes     Quit date: 08/07/2012   ??? Smokeless tobacco: Never Used   ??? Alcohol Use: No       BP 110/74   Pulse 81   Temp(Src) 97.9 ??F (36.6 ??C) (Tympanic)   Wt 230 lb 3.2 oz (104.418 kg)   BMI 37.17 kg/m2   SpO2 99%      Review of Systems    Objective:   Physical Exam   Constitutional: She is oriented to person, place, and time. She appears well-developed and well-nourished. No distress.   Cardiovascular: Normal rate, regular rhythm and normal heart sounds.    No murmur heard.  Pulmonary/Chest: Effort normal and breath sounds normal. She has no wheezes. She has no rales.   Abdominal:   Right mid abdomen with approx 1cm diameter scale curst (site of tube previously); left mid abdomen with approx 1cm open wound with packing, no surrounding erythema (site of laparoscopic instrumentation previously)   Neurological: She is alert and  oriented to person, place, and time.   Psychiatric: She has a normal mood and affect. Her behavior is normal.       Assessment:     1. Abdominal  pain, other specified site    2. Nausea & vomiting    3. Anxiety        Plan:   Home nurse to remove PICC line tomorrow.  Diet advance as tolerated.  Continue lexapro.  Wound care per home health.

## 2013-08-23 MED FILL — LACTATED RINGERS IV SOLN: INTRAVENOUS | Qty: 1000

## 2013-08-23 NOTE — Telephone Encounter (Signed)
Pt was seen on 08/22/13 for  hosp f/u

## 2013-08-23 NOTE — Telephone Encounter (Signed)
-----   Message from Marlane Hatcher sent at 08/21/2013  8:44 AM EDT -----  Contact: HOSPITAL DISCHARGE      ----- Message -----     From: Moss Mc     Sent: 08/21/2013   8:21 AM       To: [facility] Clinical Staff    The following patients have been discharged from a [facility] within the past 24-48 hours.  Please discuss a follow-up plan with each patient???s PCP and document in a telephone note using the smartphrase .tcmpdc. (May need to share it with yourself from the [facility]). Reason for call is "hospital follow up"    If you have a Care Coordinator- please review the list of patients with your Care Coordinator to ensure no duplication occurs. Thank you    [facility] SLOUGH WILLELLA, HARDING [id number] 09-14-1956 Inpatient - [facility] Discharged Emergency Aug 05 2013 11:19PM Aug 20 2013  4:14PM Intractable vomiting

## 2013-08-24 MED FILL — LACTATED RINGERS IV SOLN: INTRAVENOUS | Qty: 1000

## 2013-08-25 MED FILL — LACTATED RINGERS IV SOLN: INTRAVENOUS | Qty: 1000

## 2013-08-26 MED FILL — LACTATED RINGERS IV SOLN: INTRAVENOUS | Qty: 1000

## 2013-08-27 MED FILL — LACTATED RINGERS IV SOLN: INTRAVENOUS | Qty: 1000

## 2013-08-28 MED FILL — LACTATED RINGERS IV SOLN: INTRAVENOUS | Qty: 1000

## 2013-08-29 MED FILL — LACTATED RINGERS IV SOLN: INTRAVENOUS | Qty: 1000

## 2013-08-30 MED FILL — LACTATED RINGERS IV SOLN: INTRAVENOUS | Qty: 1000

## 2013-08-31 MED FILL — LACTATED RINGERS IV SOLN: INTRAVENOUS | Qty: 1000

## 2013-09-01 MED FILL — LACTATED RINGERS IV SOLN: INTRAVENOUS | Qty: 1000

## 2013-09-02 MED FILL — LACTATED RINGERS IV SOLN: INTRAVENOUS | Qty: 1000

## 2013-09-03 MED FILL — LACTATED RINGERS IV SOLN: INTRAVENOUS | Qty: 1000

## 2013-09-04 MED FILL — LACTATED RINGERS IV SOLN: INTRAVENOUS | Qty: 1000

## 2013-09-05 MED FILL — LACTATED RINGERS IV SOLN: INTRAVENOUS | Qty: 1000

## 2013-09-06 MED FILL — LACTATED RINGERS IV SOLN: INTRAVENOUS | Qty: 1000

## 2013-09-07 MED FILL — LACTATED RINGERS IV SOLN: INTRAVENOUS | Qty: 1000

## 2013-09-07 NOTE — Telephone Encounter (Signed)
Would see surgeon--Dr. Suzi Roots or Dahman.

## 2013-09-07 NOTE — Telephone Encounter (Signed)
Pt spent 2 months in the hospital and is now having incision pain. The pain is "real deep."    Please advise Pt.

## 2013-09-07 NOTE — Telephone Encounter (Signed)
Advised pt she needs to see her surgeon-Dr Suzi RootsNorthup or Dr Rozanna Boerahman.

## 2013-09-07 NOTE — Telephone Encounter (Signed)
APPT?? Please advise

## 2013-09-08 MED FILL — LACTATED RINGERS IV SOLN: INTRAVENOUS | Qty: 1000

## 2013-09-08 NOTE — Telephone Encounter (Signed)
Dr.Northup was text w/pt's info and phone #, he states he will call pt.

## 2013-09-08 NOTE — Telephone Encounter (Signed)
Pt states she had lap band removed and her port stretched and she believes it's beginning to heal but she is having a lot of severe pain, it hurts to sit, stand, walk, lay, breathing, if she takes a deep breathe she has shooting pain, this pain started yesterday. The pain is real deep, if she's walking she'll have to stop and bend over until is subsides. Pt is requesting to be seen today. Pt informed Dr.Northup will be contacted and she would receive a call back. Pls advise.     Pt# [phone]

## 2013-09-09 ENCOUNTER — Inpatient Hospital Stay: Admit: 2013-09-09 | Discharge: 2013-09-10 | Attending: Emergency Medicine

## 2013-09-09 MED FILL — LACTATED RINGERS IV SOLN: INTRAVENOUS | Qty: 1000

## 2013-09-09 NOTE — ED Notes (Signed)
Patient medicated for pain relief, IV NS currently infusing, Tiffany tech assisted patient to restroom, stayed by patient's side.     Nichola Sizer, RN  09/09/13 [phone]

## 2013-09-09 NOTE — ED Notes (Signed)
Patient back in room, states pain medication did nothing to her pain, Josh PA made aware. Will continue to monitor.     Mason Jim, RN  09/09/13 [phone]

## 2013-09-09 NOTE — Discharge Instructions (Signed)
Abdominal Pain: After Your Visit  Your Care Instructions     Abdominal pain has many possible causes. Some aren't serious and get better on their own in a few days. Others need more testing and treatment. If your pain continues or gets worse, you need to be rechecked and may need more tests to find out what is wrong. You may need surgery to correct the problem.  Don't ignore new symptoms, such as fever, nausea and vomiting, urination problems, pain that gets worse, and dizziness. These may be signs of a more serious problem.  Your doctor may have recommended a follow-up visit in the next 8 to 12 hours. If you are not getting better, you may need more tests or treatment.  The doctor has checked you carefully, but problems can develop later. If you notice any problems or new symptoms, get medical treatment right away.  Follow-up care is a key part of your treatment and safety. Be sure to make and go to all appointments, and call your doctor if you are having problems. It's also a good idea to know your test results and keep a list of the medicines you take.  How can you care for yourself at home?   Rest until you feel better.   To prevent dehydration, drink plenty of fluids, enough so that your urine is light yellow or clear like water. Choose water and other caffeine-free clear liquids until you feel better. If you have kidney, heart, or liver disease and have to limit fluids, talk with your doctor before you increase the amount of fluids you drink.   If your stomach is upset, eat mild foods, such as rice, dry toast or crackers, bananas, and applesauce. Try eating several small meals instead of two or three large ones.   Wait until 48 hours after all symptoms have gone away before you have spicy foods, alcohol, and drinks that contain caffeine.   Do not eat foods that are high in fat.   Avoid anti-inflammatory medicines such as aspirin, ibuprofen (Advil, Motrin), and naproxen (Aleve). These can cause stomach  upset. Talk to your doctor if you take daily aspirin for another health problem.  When should you call for help?  Call 911 anytime you think you may need emergency care. For example, call if:   You passed out (lost consciousness).   You pass maroon or very bloody stools.   You vomit blood or what looks like coffee grounds.   You have new, severe belly pain.  Call your doctor now or seek immediate medical care if:   Your pain gets worse, especially if it becomes focused in one area of your belly.   You have a new or higher fever.   Your stools are black and look like tar, or they have streaks of blood.   You have unexpected vaginal bleeding.   You have symptoms of a urinary tract infection. These may include:   Pain when you urinate.   Urinating more often than usual.   Blood in your urine.   You are dizzy or lightheaded, or you feel like you may faint.  Watch closely for changes in your health, and be sure to contact your doctor if:   You are not getting better after 1 day (24 hours).   Where can you learn more?   Go to [email or web address] and sign in to your MyChart account. Enter E907 in the Search Health Information box to learn more about "Abdominal Pain: After Your Visit."      If you do not have an account, please click on the "Sign Up Now" link.      2006-2014 [employer]. Care instructions adapted under license by [facility]. This care instruction is for use with your licensed healthcare professional. If you have questions about a medical condition or this instruction, always ask your healthcare professional. [employer] disclaims any warranty or liability for your use of this information.  Content Version: 10.1.311062; Current as of: February 15, 2013                Nausea and Vomiting: After Your Visit  Your Care Instructions     When you are nauseated, you may feel weak and sweaty and notice a lot of saliva in your mouth. Nausea often  leads to vomiting. Most of the time you do not need to worry about nausea and vomiting, but they can be signs of other illnesses.  Two common causes of nausea and vomiting are stomach flu and food poisoning. Nausea and vomiting from viral stomach flu will usually start to improve within 24 hours. Nausea and vomiting from food poisoning may last from 12 to 48 hours.  The doctor has checked you carefully, but problems can develop later. If you notice any problems or new symptoms, get medical treatment right away.  Follow-up care is a key part of your treatment and safety. Be sure to make and go to all appointments, and call your doctor if you are having problems. It's also a good idea to know your test results and keep a list of the medicines you take.  How can you care for yourself at home?   To prevent dehydration, drink plenty of fluids, enough so that your urine is light yellow or clear like water. Choose water and other caffeine-free clear liquids until you feel better. If you have kidney, heart, or liver disease and have to limit fluids, talk with your doctor before you increase the amount of fluids you drink.   Rest in bed until you feel better.   When you are able to eat, try clear soups, mild foods, and liquids until all symptoms are gone for 12 to 48 hours. Other good choices include dry toast, crackers, cooked cereal, and gelatin dessert, such as Jell-O.  When should you call for help?  Call 911 anytime you think you may need emergency care. For example, call if:   You passed out (lost consciousness).  Call your doctor now or seek immediate medical care if:   You have symptoms of dehydration, such as:   Dry eyes and a dry mouth.   Passing only a little dark urine.   Feeling thirstier than usual.   You have new or worsening belly pain.   You have a new or higher fever.   You vomit blood or what looks like coffee grounds.  Watch closely for changes in your health, and be sure to contact your doctor  if:   You have ongoing nausea and vomiting.   Your vomiting is getting worse.   Your vomiting lasts longer than 2 days.   You are not getting better as expected.   Where can you learn more?   Go to [email or web address] and sign in to your MyChart account. Enter H591 in the Search Health Information box to learn more about "Nausea and Vomiting: After Your Visit."    If you do not have an account, please click on the "Sign Up Now" link.        2006-2014 [employer]. Care instructions adapted under license by [facility]. This care instruction is for use with your licensed healthcare professional. If you have questions about a medical condition or this instruction, always ask your healthcare professional. [employer] disclaims any warranty or liability for your use of this information.  Content Version: 10.1.311062; Current as of: February 15, 2013

## 2013-09-09 NOTE — ED Notes (Signed)
Patient given discharge instructions, verbalized understanding, denies any questions at this time. Patient discharged home with daughter.     Nichola Sizer, RN  09/09/13 [phone]

## 2013-09-09 NOTE — ED Notes (Signed)
Urine sent to lab.     Mason Jim, RN  09/09/13 2050

## 2013-09-09 NOTE — ED Provider Notes (Signed)
[facility]  Emergency Department Encounter    Pt Name: Rachael Mays  MRN: [id number]  [place] [birth year]  Date of evaluation: 09/09/2013  Provider: Trenda Moots, PA    Chief Complaint   Patient presents with   ??? Post-op Problem     Pt had esophageal stent removal 2 weeks ago, and reports left sided pain from shoulders down to her feet that started 2 days ago. Denies any n/v. Denies any chest pain or SOB.        Nursing Notes, Past Medical Hx, Past Surgical Hx, Social Hx, Allergies, and Family Hx were all reviewed and agreed with or any disagreements were addressed in the HPI.    HPI:  (Location, Duration, Timing, Severity, Quality, Assoc Sx, Context, Modifying factors)  This is a  56 y.o. female presents emergency Department with left-sided abdominal pain.  Patient states that she did have a lap band removed July 30 because it had eroded holes in her esophagus and stomach.  She states that Dr. Hassell Done had placed in esophageal stent and removed approximately 4 weeks ago.  She states that she has been pain-free since yesterday.  She states that her pain is a 10 out of 10 stabbing pain 9/10 in her mid left abdomen that radiates to her back.  She states that she also feels achy all over.  She has been nauseous, but has not vomited.  She has felt cold, but denies fever.  No chest pain, shortness of breath, diarrhea, hematochezia, melena, constipation, urinary symptoms, vaginal pain, vaginal discharge or abnormal bleeding.  She states that she is postmenopausal.  She has had cesarean section, cholecystectomy, hysterectomy, lap band surgery, and upper GI.    Past Medical/Surgical History:      Diagnosis Date   ??? Anxiety    ??? Asthma    ??? Chronic back pain    ??? Deep vein thrombosis    ??? Depression    ??? GERD (gastroesophageal reflux disease)      NO LONGER SINCE LAP   ??? Pulmonary embolism (HCC)    ??? Obesity      hx of; had lap band   ??? Hyperlipidemia      hx; resolved with lap band   ??? GERD  (gastroesophageal reflux disease) 01/23/2009   ??? Hypertension          Procedure Laterality Date   ??? Colonoscopy     ??? Upper gastrointestinal endoscopy     ??? Cesarean section     ??? Cholecystectomy     ??? Hysterectomy     ??? Shoulder surgery     ??? Lap band  05/01/08     Dr. Nicole Cella   ??? Back surgery       neck  plates   ??? Colonoscopy  04/08/10   ??? Spine surgery     ??? Other surgical history       [place] Filter: curently in place   ??? Upper gastrointestinal endoscopy  05/19/13   ??? Other surgical history  07/05/13     lap band removal   ??? Upper gastrointestinal endoscopy  07/21/13     ESOPHAGEAL STENT PLACEMENT   ??? Upper gastrointestinal endoscopy  07/21/13     WITH EXCHANGE OF ESOPHAGEAL STENT   ??? Partial hysterectomy         Medications:  Discharge Medication List as of 09/09/2013 10:51 PM      CONTINUE these medications which  have NOT CHANGED    Details   ALPRAZolam (XANAX) 0.5 MG tablet Take 1 tablet by mouth 2 times daily as needed for Sleep or Anxiety., Disp-60 tablet, R-0      escitalopram (LEXAPRO) 20 MG tablet Take one tablet daily, Disp-30 tablet, R-3CORRECTED RX      acetaminophen 650 MG TABS Take 650 mg by mouth every 4 hours as needed., Disp-120 tablet, R-3      scopolamine (TRANSDERM-SCOP) 1.5 MG Place 1 patch onto the skin every 72 hours., Disp-3 patch, R-12      promethazine (PHENERGAN) 25 MG tablet Take 1 tablet by mouth every 6 hours as needed for Nausea., Disp-40 tablet, R-0      magnesium hydroxide (MILK OF MAGNESIA) 400 MG/5ML suspension Take 30 mLs by mouth daily as needed for Constipation.      sucralfate (CARAFATE) 1 GM tablet Take 1 tablet by mouth every 6 hours., Disp-120 tablet, R-3      omeprazole (PRILOSEC) 40 MG capsule Take 1 capsule by mouth daily., Disp-30 capsule, R-3      ondansetron (ZOFRAN) 4 MG tablet Take 1 tablet by mouth every 8 hours as needed for Nausea or Vomiting., Disp-30 tablet, R-0      montelukast (SINGULAIR) 10 MG tablet Take 1 tablet by mouth daily., Disp-30 tablet, R-3         fluocinonide (LIDEX) 0.05 % cream Apply topically 2 times daily., Disp-1 Tube, R-1, Normal      Wound Dressings (INTERDRY AG TEXTILE 10"X12') MISC Apply 1 each topically daily. Pt would like a call with price before filled, Disp-1 each, R-1      cetirizine (ZYRTEC ALLERGY) 10 MG tablet Take 10 mg by mouth daily.      albuterol (PROVENTIL;VENTOLIN) 90 MCG/ACT inhaler Inhale 2 puffs into the lungs every 6 hours as needed.      oxyCODONE-acetaminophen (ROXICET) 5-325 MG/5ML solution 5-10 mL Q4 hr prn pain, Disp-300 mL, R-0      NONFORMULARY TPN overnight per picc line               Review of Systems   Constitutional: Positive for chills. Negative for fever.   Respiratory: Negative for shortness of breath.    Cardiovascular: Negative for chest pain.   Gastrointestinal: Positive for nausea and abdominal pain. Negative for vomiting, diarrhea, constipation, blood in stool and anal bleeding.   Genitourinary: Negative for dysuria, urgency, frequency, hematuria, vaginal bleeding, vaginal discharge and vaginal pain.   Musculoskeletal: Positive for myalgias. Negative for back pain and gait problem.   Skin: Negative for rash.   Neurological: Negative for weakness and numbness.   All other systems reviewed and are negative.        Physical Exam   Constitutional: She is oriented to person, place, and time. She appears well-developed and well-nourished. No distress.   HENT:   Head: Normocephalic and atraumatic.   Right Ear: External ear normal.   Left Ear: External ear normal.   Cardiovascular: Normal rate, regular rhythm, normal heart sounds and intact distal pulses.  Exam reveals no gallop and no friction rub.    No murmur heard.  Pulmonary/Chest: Effort normal and breath sounds normal. No respiratory distress. She has no wheezes. She has no rales.   Abdominal: Soft. Normal appearance and bowel sounds are normal. She exhibits no distension and no mass. There is tenderness in the epigastric area, left upper quadrant and left  lower quadrant. There is no rigidity, no rebound, no guarding, no CVA  tenderness, no tenderness at McBurney's point and negative Murphy's sign.       Musculoskeletal: Normal range of motion.   Neurological: She is alert and oriented to person, place, and time.   Skin: Skin is warm and dry. She is not diaphoretic.   Psychiatric: She has a normal mood and affect. Her behavior is normal. Thought content normal.   Nursing note and vitals reviewed.      Procedures  N/A    MDM  MEDICAL DECISION MAKING    Vitals:    Filed Vitals:    09/09/13 1935 09/09/13 1936   BP: 163/79    Pulse: 77    Temp: 98.5 ??F (36.9 ??C) 98.5 ??F (36.9 ??C)   TempSrc: Oral    Resp: 19    Height: 5' 6"$  (1.676 m)    Weight: 230 lb (104.327 kg)    SpO2: 98%        LABS:   Labs Reviewed   HEPATIC FUNCTION PANEL - Abnormal; Notable for the following:     Alkaline Phosphatase 181 (*)     ALT 83 (*)     AST 72 (*)     All other components within normal limits   POCT VENOUS - Abnormal; Notable for the following:     POC Glucose 104 (*)     POC BUN 21 (*)     All other components within normal limits   CBC WITH AUTO DIFFERENTIAL   LIPASE   URINE RT REFLEX TO CULTURE   PREGNANCY, URINE   POCT CHEM BASIC W ICA        Remainder of labs reviewed and were negative at this time or not returned at the time of this note.    Orders:  Orders Placed This Encounter   Procedures   ??? CT ABDOMEN PELVIS W IV CONTRAST   ??? CBC Auto Differential   ??? Hepatic Function Panel   ??? Lipase   ??? Urine reflex to culture   ??? Pregnancy, Urine   ??? POCT chem basic w/ ICA   ??? POCT Venous   ??? Saline lock IV       EKG: N/A    RADIOLOGY:   Non-plain film images such as CT, Ultrasound and MRI are read by the radiologist. The attending, Clarise Cruz, MD, and I, Abelardo Diesel Mariona Scholes, PA, have visualized the radiologic plain film image(s) with the below findings:      Interpretation per the Radiologist below, if available at the time of this note:    [place]    (Results  Pending)   FINDINGS:  Calcified granuloma right lung base. Mild subsegmental atelectasis at the lung bases. Liver, spleen, pancreas grossly  unremarkable. Stable right adrenal nodule, indeterminate measuring 2 centimeters. Stable prominence of the common bile  duct which may relate to patient's post cholecystectomy state.  No solid renal lesion. 5 millimeter left renal cyst. No hydronephrosis. Infrarenal IVC filter is in place. The aorta is normal in  caliber. Mild-to-moderate calcification of the aorta. Urinary bladder is grossly unremarkable. Uterus is surgically absent.  Few pelvic phleboliths. These are separate from the distal ureters. Moderate stool in the colon. Bowel loops are normal in  caliber. Normal appendix. Urinary bladder is grossly remarkable.  There is subcutaneous stranding along the anterior left abdominal wall, prior surgical drainage catheter. Previously seen  stent within the distal esophagus has been removed and the drainage catheter has been removed. No free air,  free fluid, or  abscess.  IMPRESSION:  Previously seen stent within the distal esophagus and surgical drainage catheters in the abdomen have been removed.  Esophagus is grossly unremarkable. Bowel loops are normal in caliber. Normal appendix.  Stable prominence of the biliary tree likely relating to patient's post cholecystectomy state.  Stable 2 cm indeterminate right adrenal nodule.  No gross acute findings.     Fl Esophagram Complete    08/18/2013   ESOPHAGRAM:   HISTORY: Followup esophagogastric stent removal.   Total fluoroscopy time utilized: 3 minutes, 37 seconds.   FINDINGS: Following ingestion of contrast material, note was made of prompt advancement through the distal esophagus and into the stomach without delay. There is mucosal irregularity at the previous site of the proximal most aspect of the expandable mesh stent. There is however, no evidence of extravasation of contrast material from this site or more distally at the  site of prior esophageal leak. In addition, the patient described no esophageal discomfort during this study stating that the previous constant pain through this region subsided immediately upon removal of the stent. Note is again made of an indwelling inferior vena cava filter.      08/18/2013   IMPRESSION: No current evidence of contrast leakage from the distal esophagus status post esophagogastric stent removal.         PROCEDURES: N/A    CRITICAL CARE:  N/A    CONSULTATIONS: N/A    MEDICAL DECISION MAKING / ED COURSE:    Patient was given the following medications:  Medications   sodium chloride (PF) 0.9 % injection 20 mL (not administered)   ioversol (OPTIRAY) 68 % injection 125 mL (not administered)   0.9 % sodium chloride bolus (0 mLs Intravenous Stop Time 09/09/13 2258)   ondansetron (ZOFRAN) injection 4 mg (4 mg Intravenous Given 09/09/13 2013)   morphine (PF) injection 4 mg (4 mg Intravenous Given 09/09/13 2013)   HYDROmorphone HCl PF (DILAUDID) injection SOLN 1 mg (1 mg Intravenous Given 09/09/13 2152)   oxyCODONE-acetaminophen (PERCOCET) 5-325 MG per tablet 1 tablet (1 tablet Oral Given 09/09/13 2326)     The patient presents with abdominal pain.  The patient had an IV started and was given IV morphine, Zofran and fluids.  She stated that she was thrown more pain and was given Dilaudid.  CT of the abdomen and pelvis results as described above.  Urinalysis was unremarkable.  Patient is not pregnant.  BMP shows glucose 104 and BUN of 21.  CBC is unremarkable.  Hepatic function panel shows alk phos of 181, ALT of 83 and AST is 72, which are better than previous studies.  Lipase is unremarkable.  The patient is feeling better with a benign repeat examination.  I see nothing that would suggest an acute abdomen at this time.  Based on history, physical exam, risk factors, and tests; my suspicion for bowel obstruction, acute pancreatitis, abscess, perforated viscous, diverticulitis, appendicitis is very low and I  feel the patient can be managed as an outpatient with follow up.  Instructions have been given for the patient to return to the ED for worsening of the pain, high fevers, intractable vomiting, or bleeding.  I am unsure what is causing the patient's pain.  She stated that she does have an appointment with the doctor who placed her gastric bypass on Monday.  I will have her follow-up then, but if she still needs to have further evaluation I informed her she needs to follow-up with Dr.  Hassell Done.  I feel that it is safe to discharge the patient home with the prescription for Bentyl and Zofran.  She states that she would like something for pain before she leaves and I will give her 1 Percocet before leaving.    The patient tolerated their visit well.  They were seen and evaluated by the attending physician who agreed with the assessment and plan.  The patient and / or the family were informed of the results of any tests, a time was given to answer questions, a plan was proposed and they agreed with plan.      CLINICAL IMPRESSION:    1. Abdominal pain    2. Nausea        DISPOSITION Decision to Discharge    PATIENT REFERRED TO:  Nor[address] Levo, [address] [place] [address]  [phone]    Call in 2 days  to make a follow-up appointment      DISCHARGE MEDICATIONS:  Discharge Medication List as of 09/09/2013 10:51 PM      START taking these medications    Details   ondansetron (ZOFRAN ODT) 4 MG disintegrating tablet Take 1-2 tablets by mouth every 12 hours as needed for Nausea., Disp-12 tablet, R-0      dicyclomine (BENTYL) 10 MG capsule Take 1 capsule by mouth 4 times daily (before meals and nightly)., Disp-30 capsule, R-0             (Please note the MDM and HPI sections of this note were completed with a voice recognition program.  Efforts were made to edit the dictations but occasionally words are mis-transcribed.)    Abelardo Diesel Zaedyn Covin, [place], [place]  09/10/13 [phone]

## 2013-09-09 NOTE — ED Notes (Signed)
Blood sent to lab, patient medicated per MAR, IV NS currently infusing, patient on b/p cuff set to cycle q73mnutes, continuous pulse ox, both side rails up, call light at reach, bed alarm on and activated, vss, RR easy and unlabored, skin warm and dry to touch, will continue to monitor.     NNichola Sizer RN  09/09/13 2025

## 2013-09-10 LAB — CBC WITH AUTO DIFFERENTIAL
Basophils %: 1.2 %
Basophils Absolute: 0.1 10*3/uL (ref 0.0–0.2)
Eosinophils %: 3.8 %
Eosinophils Absolute: 0.4 10*3/uL (ref 0.0–0.6)
Hematocrit: 39.1 % (ref 36.0–48.0)
Hemoglobin: 12.5 g/dL (ref 12.0–16.0)
Lymphocytes %: 25.9 %
Lymphocytes Absolute: 2.7 10*3/uL (ref 1.0–5.1)
MCH: 26.4 pg (ref 26.0–34.0)
MCHC: 32 g/dL (ref 31.0–36.0)
MCV: 82.5 fL (ref 80.0–100.0)
MPV: 8.6 fL (ref 5.0–10.5)
Monocytes %: 7.2 %
Monocytes Absolute: 0.7 10*3/uL (ref 0.0–1.3)
Neutrophils %: 61.9 %
Neutrophils Absolute: 6.3 10*3/uL (ref 1.7–7.7)
Platelets: 204 10*3/uL (ref 135–450)
RBC: 4.74 M/uL (ref 4.00–5.20)
RDW: 14.9 % (ref 12.4–15.4)
WBC: 10.3 10*3/uL (ref 4.0–11.0)

## 2013-09-10 LAB — URINALYSIS WITH REFLEX TO CULTURE
Bilirubin Urine: NEGATIVE
Blood, Urine: NEGATIVE
Glucose, Ur: NEGATIVE mg/dL
Ketones, Urine: NEGATIVE mg/dL
Leukocyte Esterase, Urine: NEGATIVE
Nitrite, Urine: NEGATIVE
Protein, UA: NEGATIVE mg/dL
Specific Gravity, UA: 1.02 (ref 1.005–1.030)
Urobilinogen, Urine: 0.2 E.U./dL (ref <2.0)
pH, UA: 6.5 (ref 5.0–8.0)

## 2013-09-10 LAB — HEPATIC FUNCTION PANEL
ALT: 83 U/L — ABNORMAL HIGH (ref 10–40)
AST: 72 U/L — ABNORMAL HIGH (ref 15–37)
Albumin: 3.6 g/dL (ref 3.4–5.0)
Alkaline Phosphatase: 181 U/L — ABNORMAL HIGH (ref 40–129)
Bilirubin, Direct: 0.2 mg/dL (ref 0.0–0.3)
Bilirubin, Indirect: 0.2 mg/dL (ref 0.0–1.0)
Total Bilirubin: 0.4 mg/dL (ref 0.0–1.0)
Total Protein: 7.5 g/dL (ref 6.4–8.2)

## 2013-09-10 LAB — POCT VENOUS
CO2: 22 mEq/L (ref 21–32)
Calcium, Ionized: 1.19 mmol/L (ref 1.12–1.32)
GFR African American: >60 (ref >60)
GFR Non-African American: >60 (ref >60)
POC BUN: 21 mg/dL — ABNORMAL HIGH (ref 7–18)
POC Chloride: 105 mEq/L (ref 99–110)
POC Creatinine: 1 mg/dL (ref 0.6–1.1)
POC Glucose: 104 mg/dl — ABNORMAL HIGH (ref 70–99)
POC Potassium: 4.2 mEq/L (ref 3.5–5.1)
POC Sodium: 142 mEq/L (ref 136–145)

## 2013-09-10 LAB — LIPASE: Lipase: 41 U/L (ref 13.0–60.0)

## 2013-09-10 LAB — PREGNANCY, URINE: HCG(Urine) Pregnancy Test: NEGATIVE

## 2013-09-10 MED ORDER — ONDANSETRON 4 MG PO TBDP
4 MG | ORAL_TABLET | Freq: Two times a day (BID) | ORAL | Status: DC | PRN
Start: 2013-09-10 — End: 2014-02-01

## 2013-09-10 MED ORDER — DICYCLOMINE HCL 10 MG PO CAPS
10 MG | ORAL_CAPSULE | Freq: Four times a day (QID) | ORAL | Status: DC
Start: 2013-09-10 — End: 2013-09-14

## 2013-09-10 MED ADMIN — HYDROmorphone HCl PF (DILAUDID) injection SOLN 1 mg: INTRAVENOUS | @ 02:00:00 | NDC 00409128331

## 2013-09-10 MED ADMIN — 0.9 % sodium chloride bolus: INTRAVENOUS | NDC 00338004904

## 2013-09-10 MED ADMIN — morphine (PF) injection 4 mg: INTRAVENOUS | NDC 00409189101

## 2013-09-10 MED ADMIN — ondansetron (ZOFRAN) injection 4 mg: INTRAVENOUS | NDC 23155037831

## 2013-09-10 MED ADMIN — oxyCODONE-acetaminophen (PERCOCET) 5-325 MG per tablet 1 tablet: ORAL | @ 03:00:00 | NDC 00406051223

## 2013-09-10 MED FILL — MORPHINE SULFATE (PF) 4 MG/ML IV SOLN: 4 mg/mL | INTRAVENOUS | Qty: 1

## 2013-09-10 MED FILL — NORMAL SALINE FLUSH 0.9 % IV SOLN: 0.9 % | INTRAVENOUS | Qty: 20

## 2013-09-10 MED FILL — OXYCODONE-ACETAMINOPHEN 5-325 MG PO TABS: 5-325 MG | ORAL | Qty: 1

## 2013-09-10 MED FILL — LACTATED RINGERS IV SOLN: INTRAVENOUS | Qty: 1000

## 2013-09-10 MED FILL — HYDROMORPHONE HCL PF 1 MG/ML IJ SOLN: 1 MG/ML | INTRAMUSCULAR | Qty: 1

## 2013-09-10 MED FILL — ONDANSETRON HCL 4 MG/2ML IJ SOLN: 4 MG/2ML | INTRAMUSCULAR | Qty: 2

## 2013-09-10 MED FILL — SODIUM CHLORIDE 0.9 % IV SOLN: 0.9 % | INTRAVENOUS | Qty: 1000

## 2013-09-10 NOTE — ED Provider Notes (Signed)
I have personally performed a face to face diagnostic evaluation on this patient. I have fully participated in the care of this patient. I have reviewed and agree with all pertinent clinical information including history, physical exam, diagnostic tests, and the plan.     History and Physical as follows:  57 year old female here in the emergency department with some left-sided abdominal pain.  Past medical history significant for multiple procedures including the lap band, lap band removal, esophageal stent, stent removal.  She has also had a C-section, cholecystectomy, hysterectomy.  No known history of diverticulitis.  No fevers.  She has had nausea without vomiting.  No bloody stools.  No diarrhea or constipation.  No other complaints.    On physical exam she is lying on her left side.  She was able to lay on her back for abdominal examination.  She has tenderness noted in the left lower quadrant and mildly in the left upper quadrant.  No guarding, rebound, rigidity.    Given her complicated medical history and abdominal pain described as severe a repeat CT scan was obtained.  CT and labs reviewed.  No evidence for UTI.  No evidence for acute abdominal process.  No evidence for surgical complication.  Pain was controlled with IV medication.  Follow-up with gastroenterology outpatient.  Agree with PA management.    Clarise Cruz, MD  09/10/13 0100

## 2013-09-11 MED FILL — LACTATED RINGERS IV SOLN: INTRAVENOUS | Qty: 1000

## 2013-09-11 NOTE — Care Coordination-Inpatient (Signed)
Sent message to MD to see if patient is appropriate for care coordination.

## 2013-09-11 NOTE — Telephone Encounter (Signed)
Perfect.  Thanks!

## 2013-09-12 MED FILL — LACTATED RINGERS IV SOLN: INTRAVENOUS | Qty: 1000

## 2013-09-12 NOTE — Care Coordination-Inpatient (Signed)
[facility] ED Follow up Call    Reason for ED visit:  Severe Left sided abdominal pain, chills, nausea.   Status:     Improved    Did you receive a discharge instructions from the Emergency Room? Yes  Review of Instructions:     Understands what to report/when to return?:  Yes   Understands discharge instructions?:  Yes   Following discharge instructions?:  Yes   If not why? NA    Are there any new complaints of pain? No, she still has pain in left side of abdomen 5/10 (at hospital was 10/10) and pt states at night it is worse.    If you have a wound is the dressing clean, dry, and intact? Yes, wound from stent removal is almost healed  Understands wound care regimen? Yes    Are there any other complaints/concerns that you wish to tell your provider?   None at this time    FU appts/Provider:    PCP next appt:  Visit date not found   Specialist FU scheduled:Yes  When:  Thursday 09/14/13 at 2:45 pm with Dr Brain Hilts who did her lap band surgery      New Medications?:   Yes    Zofran 4mg  tablets, take 1 to 2 tablets PO every 12 hours PRN for pain - pt already has a previous prescription for Zofran 4mg  take PO every 8 hrs PRN for nausea    Bentyl 10 mg capsules, take 1 capsule PO 4 times daily and at night - pt states she never got this filled and is not taking.  Pt wants to wait until she sees Dr Dwain Sarna on 09/14/13 Thursday.    Medication Reconciliation by phone - Yes     Understands Medications?  Yes  Taking Medications? Yes, except for new medication Bentyl see note above      Any further needs in the home i.e. Equipment?  No    Link to services in community?:  Yes, [facility]    Which services:  TPN, skilled nursing    Pt states the aching pain in her left abdomen is less 5/10 during the day, and at night is greater 10/10.  Pt had esphageal stent removed 2 weeks ago.  Pt was to follow up with Dr Dollene Primrose, gastroenterologist.  Pt made appointment but Dr Ermalene Searing office called her to cancel  it and requested she see Dr. Brain Hilts first, MD who did her lap band surgery.  Pt has appointment on 09/14/13 with Dr Dwain Sarna.  Pt will ask Dr Dwain Sarna about getting pain medication.

## 2013-09-14 MED FILL — LACTATED RINGERS IV SOLN: INTRAVENOUS | Qty: 1000

## 2013-09-14 NOTE — Patient Instructions (Signed)
Patient received dietary handouts and education.

## 2013-09-14 NOTE — Progress Notes (Signed)
Rachael Mays gained 8.5 lbs over the past 4 months.  Pt states that she tires very easily and she has constant pain in  Her left side.  She drove for the first time today.  She was in a great deal of pain and joint pain throughout her body.  By late afternoon she could not walk and her family brought her to the ER.  She had an IV morphine.  She is eating 4-5 times a day.  Nothing sounds good to her so it depends on the day on what she will eat.  She does avoid fried foods and high sugar foods.  She does admit to drinking one soda a day. She states the depression has been bad and she has been restarted on lexapro.  She does not wish to have the sleeve.  Goal: Make sure that she is having protein at meals and snacks. Handouts given: none. Will follow up as necessary.

## 2013-09-15 MED FILL — LACTATED RINGERS IV SOLN: INTRAVENOUS | Qty: 1000

## 2013-09-15 NOTE — Progress Notes (Signed)
The patient is a 57 y.o. female who returns today for follow up. We discussed how her weight affects her overall health including:  Patient Active Problem List   Diagnosis   ??? Back Pain   ??? Deep Vein Thrombosis   ??? Depression   ??? High Cholesterol   ??? Hypertension   ??? Pulmonary Embolism   ??? Nausea & vomiting   ??? Abdominal  pain, other specified site   ??? Morbid obesity   ??? Status post gastric banding   ??? Vertigo   ??? Dizziness   ??? Tinnitus, subjective   ??? Meniere syndrome   ??? Complication of gastric banding   ??? Wound infection (HCC)   ??? HTN (hypertension)   ??? Hyperglycemia      The patient's current Body mass index is 38.43 kg/(m^2). (09/15/2013).  Since her last visit she has stable / unchanged. Rachael Mays underwent thirty minutes of dietary counseling, and I have reviewed and agree with the dietary counseling, and I have reviewed and agree with the diet plan. There are no changes in the patients medical history or physical exam.     She went to the ED with diffuse joint pain and had a CT scan which was normal.  I think she is having some pain issues post hospitalization.  Discussed this with her and states she has greatly reduced her pain med intake.  No further issues s/p band removal.  Doing well from that stand point.  Will see again for non-surgical weight loss options.    The patient will follow up in 3-6 months

## 2013-09-15 NOTE — Care Coordination-Inpatient (Signed)
Called to follow up with patient about current health issues. No answer. Left general message with call back phone number

## 2013-09-16 MED FILL — LACTATED RINGERS IV SOLN: INTRAVENOUS | Qty: 1000

## 2013-09-17 MED FILL — LACTATED RINGERS IV SOLN: INTRAVENOUS | Qty: 1000

## 2013-09-18 MED ORDER — ALPRAZOLAM 0.5 MG PO TABS
0.5 MG | ORAL_TABLET | ORAL | Status: DC
Start: 2013-09-18 — End: 2014-03-12

## 2013-09-18 MED FILL — LACTATED RINGERS IV SOLN: INTRAVENOUS | Qty: 1000

## 2013-09-22 NOTE — Care Coordination-Inpatient (Signed)
Called to follow up with patient about current health issues. No answer. Left general message with call back phone number

## 2013-09-29 NOTE — Care Coordination-Inpatient (Signed)
Daughter answered cell phone number and will give message to patient to call [facility].

## 2013-10-06 NOTE — Care Coordination-Inpatient (Signed)
Called to follow up with patient about current health issues. No answer. Left general message with call back phone number  Patient has called [facility] a couple times and left message since [facility] reached out to daughter.  We continue to miss each other--will continue trying to reach patient.

## 2013-10-11 NOTE — Care Coordination-Inpatient (Signed)
Called to follow up with patient about current health issues. No answer. Left general message with call back phone number

## 2013-10-12 NOTE — Care Coordination-Inpatient (Signed)
Received call from patient to establish care. She reviewed her various health issues. States that she had gastric sleeve in her stomach and it corroded in her body.   She had multiple issues and was hospitalized for 2 months.  She states she almost died.  She states she is now starting to put on weight again. She also is having issues with getting her strength back.  She would very much like to work with care coordination team to help manage her weight and health.    Action: made referral to dietician.  Talked about benefits of working with care coordination. Established relationship.  Patient's grandson is getting off school bus so she needs to end conversation.  Will complete initial eval at later date.

## 2013-10-17 MED ORDER — AMITRIPTYLINE HCL 25 MG PO TABS
25 MG | ORAL_TABLET | Freq: Every evening | ORAL | Status: DC
Start: 2013-10-17 — End: 2014-02-16

## 2013-10-17 NOTE — Progress Notes (Signed)
Subjective:      Patient ID: Rachael Mays is a 57 y.o. female.    HPI     Patient presents with decreased endurance since discharge from the hospital after complicated lap band removal over the summer requiring almost 2 months of hospitalization.  She has difficulty walking for prolonged periods of time.  She hurts all over.      Patient Active Problem List   Diagnosis   ??? Back Pain   ??? Deep Vein Thrombosis   ??? Depression   ??? High Cholesterol   ??? Hypertension   ??? Pulmonary Embolism   ??? Nausea & vomiting   ??? Abdominal  pain, other specified site   ??? Morbid obesity (HCC)   ??? Status post gastric banding   ??? Vertigo   ??? Dizziness   ??? Tinnitus, subjective   ??? Meniere syndrome   ??? Complication of gastric banding   ??? Wound infection (HCC)   ??? HTN (hypertension)   ??? Hyperglycemia       Outpatient Prescriptions Marked as Taking for the 10/17/13 encounter (Office Visit) with Lum Keas, MD   Medication Sig Dispense Refill   ??? amitriptyline (ELAVIL) 25 MG tablet Take 1 tablet by mouth nightly.  30 tablet  3       Allergies   Allergen Reactions   ??? Talwin [Pentazocine] Other (See Comments)     dizzy       History   Substance Use Topics   ??? Smoking status: Former Smoker -- 0.50 packs/day for 35 years     Types: Cigarettes     Quit date: 08/07/2012   ??? Smokeless tobacco: Never Used   ??? Alcohol Use: No       BP 130/80   Pulse 87   Wt 250 lb (113.399 kg)   SpO2 98%      Review of Systems    Objective:   Physical Exam   Constitutional: She is oriented to person, place, and time. She appears well-developed and well-nourished. No distress.   Cardiovascular: Normal rate, regular rhythm and normal heart sounds.    No murmur heard.  Pulmonary/Chest: Effort normal and breath sounds normal. She has no wheezes. She has no rales.   Neurological: She is alert and oriented to person, place, and time.   Psychiatric: She has a normal mood and affect. Her behavior is normal.       Assessment:     1. Physical deconditioning     2.  Chronic pain  amitriptyline (ELAVIL) 25 MG tablet     Plan:   PT at [facility].  Trial elavil for chronic pain.

## 2013-10-23 NOTE — Telephone Encounter (Signed)
Likely viral.  Lots of this going around right now.

## 2013-10-23 NOTE — Telephone Encounter (Signed)
Please advise

## 2013-10-23 NOTE — Telephone Encounter (Signed)
Fever today was 101.3, and c/o extreme pain with swallowing. Feels like her "throat is closing".  Please advise

## 2013-10-23 NOTE — Telephone Encounter (Signed)
Daughter is calling in ref to her mother being in last week( 10-17-13)  with her son and for herself,   Patient is now running a fever as well as grandson . No vomiting or Diarrhea     They would like to know if something can be called in         Ph[facility]:  [facility] [address] - [place], [place] - [address] - P [phone] F [phone]

## 2013-10-23 NOTE — Telephone Encounter (Signed)
Pt advised she needs a rapid strep-nurse visit.

## 2013-10-24 LAB — POCT RAPID STREP A: Strep A Ag: POSITIVE — AB

## 2013-10-24 MED ORDER — AMOXICILLIN 875 MG PO TABS
875 MG | ORAL_TABLET | Freq: Two times a day (BID) | ORAL | Status: AC
Start: 2013-10-24 — End: 2013-11-03

## 2013-11-09 NOTE — Telephone Encounter (Signed)
Spoke with pt and she schedule an appt for tomorrow with Dr. Riley LamKoehl. Pt stated that it was not a serious problem and that she would go to the ER if it gets to that point

## 2013-11-09 NOTE — Telephone Encounter (Signed)
Pt is complaining of SOB and stabbing pain in her upper back.  I tried to transfer her for triage but she hung up.    Can you call her back to evaluate--we have NO appts left at all today except 10 mins with WM

## 2013-11-10 ENCOUNTER — Encounter

## 2013-11-10 MED ORDER — LIDOCAINE 5 % EX PTCH
5 % | MEDICATED_PATCH | Freq: Every day | CUTANEOUS | Status: DC
Start: 2013-11-10 — End: 2014-05-17

## 2013-11-10 NOTE — Progress Notes (Signed)
Subjective:      Patient ID: Rachael Mays is a 57 y.o. female.    HPI back pain not work related,on and off for a while.center back bra area pt cant lfit arm its pain sitting and walking reaching its painful.    Pain in back, right in center, right where bra runs.  Sharp pains if lifts arms above the head.  Feeling more SOB but doesn't hurt to breathe.  Has had this for about a year, has seen TS for it and had work up but nothing found.  Pain is getting worse over past week.  More stabbing.  Feels it all day long.    Hasn't been on clonidine patch for few weeks, should be in mail today.    Review of Systems    Objective:   Physical Exam  Alert and oriented x 4 NAD, Obese, well hydrated, well developed.  FROM arms and neck, some pain across mid spine with motion.  Mild tender along mid back at bra line and around shoulder blades, no specific point tenderness, normal UE strength    Assessment:     Rachael Mays was seen today for back pain.    Diagnoses and associated orders for this visit:    Thoracic spine pain  Get XR  Cont current sx tx  lidoderm patches  May need PT    HTN (hypertension)  Back on BP meds    Other Orders  - lidocaine (LIDODERM) 5 %; Place 1 patch onto the skin daily. 12 hours on, 12 hours off.

## 2013-11-20 NOTE — Telephone Encounter (Signed)
Tramadol 50mg  q 6 hours prn pain #60.  Would really recommend PT.  They can even do therapy with heat and massage which could be helpful.

## 2013-11-20 NOTE — Telephone Encounter (Signed)
Did she try lidoderm patches and PT as recommended by Dr. Riley Lam?

## 2013-11-20 NOTE — Telephone Encounter (Signed)
Spoke with pt and she states that the patches make her feel a little better, but the spasms she gets makes her bend over and cannot do PT due to the extreme pain she has.

## 2013-11-20 NOTE — Telephone Encounter (Signed)
Rx has been called in. Will Rx for PT tomorrow per pt request to [facility]

## 2013-11-20 NOTE — Telephone Encounter (Signed)
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  [facility] [address]

## 2013-11-21 NOTE — Telephone Encounter (Signed)
PT order has been fax

## 2013-12-09 ENCOUNTER — Encounter

## 2013-12-09 MED ORDER — MONTELUKAST SODIUM 10 MG PO TABS
10 MG | ORAL_TABLET | Freq: Every day | ORAL | Status: DC
Start: 2013-12-09 — End: 2013-12-15

## 2013-12-15 ENCOUNTER — Encounter

## 2013-12-15 MED ORDER — MONTELUKAST SODIUM 10 MG PO TABS
10 MG | ORAL_TABLET | Freq: Every day | ORAL | Status: DC
Start: 2013-12-15 — End: 2014-03-16

## 2014-01-09 MED ORDER — TRAMADOL HCL 50 MG PO TABS
50 MG | ORAL_TABLET | Freq: Three times a day (TID) | ORAL | Status: DC | PRN
Start: 2014-01-09 — End: 2014-05-17

## 2014-01-09 NOTE — Telephone Encounter (Signed)
Pt is requesting a refill on her Tramadol.  I do not see it listed in her meds.  She said it was given to her after her 11/11 hospital follow up visit.    She is still in a lot of back pain.  She is currently taking 50mg .  She wants to know if she can take two 50mg  or perhaps increase the milligrams b/c the 50mg  isn't taking away the pain.    If it can be called in she would like it called into [facility] [address].      She would like a callback to let her know.

## 2014-01-09 NOTE — Telephone Encounter (Signed)
Please advise

## 2014-01-09 NOTE — Telephone Encounter (Signed)
Tramadol 50mg , 2 tabs tid prn pain, #60

## 2014-01-09 NOTE — Telephone Encounter (Signed)
Rx called to [facility].  Pt informed of info below.

## 2014-01-18 ENCOUNTER — Encounter

## 2014-01-18 MED ORDER — ESCITALOPRAM OXALATE 20 MG PO TABS
20 MG | ORAL_TABLET | ORAL | Status: DC
Start: 2014-01-18 — End: 2014-02-01

## 2014-01-18 NOTE — Telephone Encounter (Signed)
Ok for this?

## 2014-01-18 NOTE — Telephone Encounter (Signed)
Okay 

## 2014-02-01 ENCOUNTER — Encounter

## 2014-02-01 MED ORDER — ONDANSETRON 4 MG PO TBDP
4 MG | ORAL_TABLET | Freq: Three times a day (TID) | ORAL | Status: DC | PRN
Start: 2014-02-01 — End: 2014-05-17

## 2014-02-01 MED ORDER — DULOXETINE HCL 60 MG PO CPEP
60 MG | ORAL_CAPSULE | Freq: Every day | ORAL | Status: DC
Start: 2014-02-01 — End: 2014-04-02

## 2014-02-01 NOTE — Progress Notes (Deleted)
Subjective:      Patient ID: Rachael Mays is a 58 y.o. female.    HPI Pt having pain where port was at left side lower left severe rib pain,tramadol not working.    Review of Systems    Objective:   Physical Exam    Assessment:      ***      Plan:      ***        1

## 2014-02-01 NOTE — Care Coordination-Inpatient (Signed)
Rachael Mays  02/01/2014                Nurse Care Coordinator Progress Note    Assessment: Rachael Mays is a 58 y.o. female. Met with patient in office.  She has c/o continued abd pain.     Goals    ??? Exercise 3x per week (30 min per time)     ??? Weight < 200 lb (90.719 kg)         Barriers to meeting goals: impairment:  physical: chronic pain and overwhelmed by complexity of regimen  PHQ9 TESTING 02/01/2014   PHQ9 Doc Flowsheet 18     Interpretation of Total Score Depression Severity: 1-4 = Minimal depression, 5-9 = Mild depression, 10-14 = Moderate depression, 15-19 = Moderately severe depression, 20-27 = Severe depression-      Medication Reconciliation performed: Yes    Action:  - reviewed goals, completed CC flowsheet.  Completed phq-9.  Assessed for patient issues.  Set up appt with dietician.  Gave patient information on 1500 and 1800 cal diets plans and invited patient to healthy eating class at [employer] next week.  Offered opportunity for patient to see psychiatrist or counselor. Declined at this time. Gave patient [name] to fill out and return  Plan of Care:  - follow monthly and PRN. Assist with goals for weight loss and exercise.          Arlice Colt  Nurse Care Coordinator

## 2014-02-01 NOTE — Progress Notes (Signed)
Subjective:      Patient ID: Rachael Mays is a 58 y.o. female.    HPI     Patient presents to discuss depression.  Since complicated lap band removal last fall, she has had a prolonged recovery requiring prolonged hospitalizations.  Currently home but frustrated with living situation (grown children have moved back in and she is responsible often for grandson).  Not working.  Tries to crochet to keep busy but tailbone hurts and she has to get up and move every few minutes (following with neurosurgery).  Used to be able to get out of house and hangout with girlfriend but has not felt well enough to do this.  Eating less.  Nauseated at times.  Will not follow up with surgeon due to frustration with surgical outcome.  Has not followed up with GI.  Soreness still at site of previous port in left abdomen.  No SI or HI.    Patient Active Problem List   Diagnosis   ??? Back Pain   ??? Deep Vein Thrombosis   ??? Depression   ??? High Cholesterol   ??? Hypertension   ??? Pulmonary Embolism   ??? Nausea & vomiting   ??? Abdominal  pain, other specified site   ??? Morbid obesity ([place])   ??? Status post gastric banding   ??? Vertigo   ??? Dizziness   ??? Tinnitus, subjective   ??? Meniere syndrome   ??? Complication of gastric banding   ??? Wound infection ([place])   ??? HTN (hypertension)   ??? Hyperglycemia       Outpatient Prescriptions Marked as Taking for the 02/01/14 encounter (Office Visit) with Harvel Quale, MD   Medication Sig Dispense Refill   ??? ondansetron (ZOFRAN ODT) 4 MG disintegrating tablet Take 1 tablet by mouth every 8 hours as needed for Nausea or Vomiting.  90 tablet  0   ??? DULoxetine (CYMBALTA) 60 MG capsule Take 1 capsule by mouth daily.  30 capsule  3   ??? traMADol (ULTRAM) 50 MG tablet Take 2 tablets by mouth 3 times daily as needed for Pain.  60 tablet  0   ??? montelukast (SINGULAIR) 10 MG tablet Take 1 tablet by mouth daily.  90 tablet  0   ??? lidocaine (LIDODERM) 5 % Place 1 patch onto the skin daily. 12 hours on, 12 hours off.   30 patch  6   ??? amitriptyline (ELAVIL) 25 MG tablet Take 1 tablet by mouth nightly.  30 tablet  3   ??? ALPRAZolam (XANAX) 0.5 MG tablet TAKE ONE TABLET BY MOUTH TWICE A DAY AS NEEDED FOR ANXIETY OR SLEEP  60 tablet  0   ??? acetaminophen 650 MG TABS Take 650 mg by mouth every 4 hours as needed.  120 tablet  3   ??? scopolamine (TRANSDERM-SCOP) 1.5 MG Place 1 patch onto the skin every 72 hours.  3 patch  12   ??? magnesium hydroxide (MILK OF MAGNESIA) 400 MG/5ML suspension Take 30 mLs by mouth daily as needed for Constipation.       ??? sucralfate (CARAFATE) 1 GM tablet Take 1 tablet by mouth every 6 hours.  120 tablet  3   ??? omeprazole (PRILOSEC) 40 MG capsule Take 1 capsule by mouth daily.  30 capsule  3   ??? fluocinonide (LIDEX) 0.05 % cream Apply topically 2 times daily.  1 Tube  1   ??? cetirizine (ZYRTEC ALLERGY) 10 MG tablet Take 10 mg by mouth  daily.       ??? albuterol (PROVENTIL;VENTOLIN) 90 MCG/ACT inhaler Inhale 2 puffs into the lungs every 6 hours as needed.           Allergies   Allergen Reactions   ??? Talwin [Pentazocine] Other (See Comments)     dizzy       History   Substance Use Topics   ??? Smoking status: Former Smoker -- 0.50 packs/day for 35 years     Types: Cigarettes     Quit date: 08/07/2012   ??? Smokeless tobacco: Never Used   ??? Alcohol Use: No       BP 158/88    Pulse 83    Temp(Src) 99.8 ??F (37.7 ??C)    Wt 272 lb (123.378 kg)    SpO2 98%    Breastfeeding? No         Review of Systems    Objective:   Physical Exam   Constitutional: She is oriented to person, place, and time. She appears well-developed and well-nourished. No distress.   Cardiovascular: Normal rate, regular rhythm and normal heart sounds.    No murmur heard.  Pulmonary/Chest: Effort normal and breath sounds normal. She has no wheezes. She has no rales.   Abdominal: Soft. Bowel sounds are normal. There is no tenderness.   obese   Neurological: She is alert and oriented to person, place, and time.   Psychiatric: She has a normal mood and affect.  Her behavior is normal.       Assessment:     1. Depression  DULoxetine (CYMBALTA) 60 MG capsule   2. Nausea  ondansetron (ZOFRAN ODT) 4 MG disintegrating tablet   3. Chronic pain  DULoxetine (CYMBALTA) 60 MG capsule     Plan:     Change lexapro to cymbalta.  Schedule with Dr. Beacher May.  Check cbc and tsh.    Zofran for nausea.

## 2014-02-02 ENCOUNTER — Encounter

## 2014-02-02 LAB — CBC WITH AUTO DIFFERENTIAL
Basophils %: 0.6 %
Basophils Absolute: 0 10*3/uL (ref 0.0–0.2)
Eosinophils %: 4.5 %
Eosinophils Absolute: 0.4 10*3/uL (ref 0.0–0.6)
Hematocrit: 40.9 % (ref 36.0–48.0)
Hemoglobin: 13.5 g/dL (ref 12.0–16.0)
Lymphocytes %: 38.9 %
Lymphocytes Absolute: 3.2 10*3/uL (ref 1.0–5.1)
MCH: 26.5 pg (ref 26.0–34.0)
MCHC: 32.9 g/dL (ref 31.0–36.0)
MCV: 80.5 fL (ref 80.0–100.0)
MPV: 9.5 fL (ref 5.0–10.5)
Monocytes %: 8.4 %
Monocytes Absolute: 0.7 10*3/uL (ref 0.0–1.3)
Neutrophils %: 47.6 %
Neutrophils Absolute: 3.9 10*3/uL (ref 1.7–7.7)
Platelets: 214 10*3/uL (ref 135–450)
RBC: 5.08 M/uL (ref 4.00–5.20)
RDW: 16.3 % — ABNORMAL HIGH (ref 12.4–15.4)
WBC: 8.1 10*3/uL (ref 4.0–11.0)

## 2014-02-02 LAB — COMPREHENSIVE METABOLIC PANEL
ALT: 39 U/L (ref 10–40)
AST: 31 U/L (ref 15–37)
Albumin/Globulin Ratio: 1.1 (ref 1.1–2.2)
Albumin: 3.8 g/dL (ref 3.4–5.0)
Alkaline Phosphatase: 124 U/L (ref 40–129)
Anion Gap: 16 (ref 3–16)
BUN: 12 mg/dL (ref 7–20)
CO2: 25 mmol/L (ref 21–32)
Calcium: 9.4 mg/dL (ref 8.3–10.6)
Chloride: 101 mmol/L (ref 99–110)
Creatinine: 0.7 mg/dL (ref 0.6–1.1)
GFR African American: >60 (ref >60)
GFR Non-African American: >60 (ref >60)
Globulin: 3.6 g/dL
Glucose: 94 mg/dL (ref 70–99)
Potassium: 4 mmol/L (ref 3.5–5.1)
Sodium: 142 mmol/L (ref 136–145)
Total Bilirubin: 0.4 mg/dL (ref 0.0–1.0)
Total Protein: 7.4 g/dL (ref 6.4–8.2)

## 2014-02-02 LAB — TSH: TSH: 1.78 u[IU]/mL (ref 0.27–4.20)

## 2014-02-06 NOTE — Care Coordination-Inpatient (Signed)
Received completed [name] survey.    Overarching Coaching Goals: [name] Level 2    -  Close any knowledge gaps  -  Help individual overcome feeling of overwhelmed  -  Skill-build to enhance the individual's realization that slight changes in behavior can lead to meaningful improvements in health and wellbeing  -  Build confidence by providing skill-building/problem solving opportunities that can lead to small, positive changes  -  Individual identifies inconsistencies with health self-management  -  Focus on current health issues  - Use positive message, avoid disagreement, show empathy  -  Build awareness of their health targets/numbers  -  Individual identifies major stressors, identify a potential strategy for stress reduction, implements plan, and tracks results  - Let the individual choose the area to work on, and then guide toward appropriate goals/steps      Score 52.9

## 2014-02-15 MED ORDER — CLONIDINE HCL 0.1 MG/24HR TD PTWK
0.1 MG/24HR | MEDICATED_PATCH | TRANSDERMAL | Status: DC
Start: 2014-02-15 — End: 2014-04-10

## 2014-02-16 ENCOUNTER — Encounter

## 2014-02-16 MED ORDER — AMITRIPTYLINE HCL 25 MG PO TABS
25 MG | ORAL_TABLET | Freq: Every evening | ORAL | Status: DC
Start: 2014-02-16 — End: 2014-02-27

## 2014-02-19 ENCOUNTER — Encounter

## 2014-02-19 NOTE — Care Coordination-Inpatient (Signed)
Met with patient in office for teaching on dietary guidelines.  Patient states that she is having a great deal of pain and has continued weakness ever since her lapband revision last year.  She has not ff with Dr. Janey Genta in spite of being directed to do so by her PCP.  Advised patient that since she is unable to tolerate physical activity and is still having issues with pain in her abdomen, she truly needs to be seen by Dr. Janey Genta prior to trying work on losing weight.    She agrees to do this and will call [facility] when this appt is made.

## 2014-02-27 MED ORDER — FUROSEMIDE 40 MG PO TABS
40 MG | ORAL_TABLET | ORAL | Status: DC
Start: 2014-02-27 — End: 2014-03-12

## 2014-02-27 NOTE — Progress Notes (Signed)
Subjective:      Patient ID: Rachael Mays is a 58 y.o. female.    HPI Pt presents with bilateral leg edema, also swelling in both hands x 1 week.  Pt states she has chronic fatigue.  Patient states that her hands are swollen in the morning she has difficulty getting her rings on and off and then as the day progresses she has swelling in her feet and legs.  She denies any changes of her medication and she's not use any additional over-the-counter medications.  She has not really been following any stringent diet in regards to her salt intake.  Her fatigue problems has gone on for some time and medications were adjusted with an last several months.  She's been very compliant with her medications.    Review of Systems  Patient Active Problem List   Diagnosis   . Back Pain   . Deep Vein Thrombosis   . Depression   . High Cholesterol   . Hypertension   . Pulmonary Embolism   . Nausea & vomiting   . Abdominal  pain, other specified site   . Morbid obesity ([facility])   . Status post gastric banding   . Vertigo   . Dizziness   . Tinnitus, subjective   . Meniere syndrome   . Complication of gastric banding   . Wound infection ([facility])   . HTN (hypertension)   . Hyperglycemia       Outpatient Prescriptions Marked as Taking for the 02/27/14 encounter (Office Visit) with Fanny Dance, MD   Medication Sig Dispense Refill   .       . cloNIDine (CATAPRES) 0.1 MG/24HR Place 1 patch onto the skin once a week.  4 patch  5   . ondansetron (ZOFRAN ODT) 4 MG disintegrating tablet Take 1 tablet by mouth every 8 hours as needed for Nausea or Vomiting.  90 tablet  0   . DULoxetine (CYMBALTA) 60 MG capsule Take 1 capsule by mouth daily.  30 capsule  3   . traMADol (ULTRAM) 50 MG tablet Take 2 tablets by mouth 3 times daily as needed for Pain.  60 tablet  0   . montelukast (SINGULAIR) 10 MG tablet Take 1 tablet by mouth daily.  90 tablet  0   . lidocaine (LIDODERM) 5 % Place 1 patch onto the skin daily. 12 hours on, 12 hours off.  30 patch  6    . ALPRAZolam (XANAX) 0.5 MG tablet TAKE ONE TABLET BY MOUTH TWICE A DAY AS NEEDED FOR ANXIETY OR SLEEP  60 tablet  0   . acetaminophen 650 MG TABS Take 650 mg by mouth every 4 hours as needed.  120 tablet  3   . magnesium hydroxide (MILK OF MAGNESIA) 400 MG/5ML suspension Take 30 mLs by mouth daily as needed for Constipation.       . sucralfate (CARAFATE) 1 GM tablet Take 1 tablet by mouth every 6 hours.  120 tablet  3   . omeprazole (PRILOSEC) 40 MG capsule Take 1 capsule by mouth daily.  30 capsule  3   . [DISCONTINUED] fluocinonide (LIDEX) 0.05 % cream Apply topically 2 times daily.  1 Tube  1   . cetirizine (ZYRTEC ALLERGY) 10 MG tablet Take 10 mg by mouth daily.       Marland Kitchen albuterol (PROVENTIL;VENTOLIN) 90 MCG/ACT inhaler Inhale 2 puffs into the lungs every 6 hours as needed.  Allergies   Allergen Reactions   . Talwin [Pentazocine] Other (See Comments)     dizzy       History   Substance Use Topics   . Smoking status: Former Smoker -- 0.50 packs/day for 35 years     Types: Cigarettes     Quit date: 08/07/2012   . Smokeless tobacco: Never Used   . Alcohol Use: No       BP 150/90   Pulse 92   Wt 277 lb (125.646 kg)   SpO2 98%         Objective:   Physical Exam   Constitutional: She appears well-developed and well-nourished. She is cooperative.   Neck: Carotid bruit is not present. No thyromegaly present.   Cardiovascular: Normal rate, regular rhythm and normal heart sounds.    No murmur heard.  Pulses:       Dorsalis pedis pulses are 2+ on the right side, and 2+ on the left side.        Posterior tibial pulses are 2+ on the right side, and 2+ on the left side.   Pulmonary/Chest: Effort normal and breath sounds normal.   Musculoskeletal: Normal range of motion. She exhibits edema (2+ edemaof the mid ankle  to the top of the foot  bilaterally).   Lymphadenopathy:     She has no cervical adenopathy.   Neurological: She is alert. She has normal strength and normal reflexes. No sensory deficit.        Assessment:      Rachael Mays was seen today for edema.    Diagnoses and associated orders for this visit:    Edema    Fatigue    Other Orders  - furosemide (LASIX) 40 MG tablet; BID for 1 week then 1 qAM             Plan:      Laboratory profiling from March was reviewed with patient.  I discussed with her of staying on a low-salt diet and information handout were given to her in regards to this.  Maintain her current medications.  She was advised to see the bariatric surgeon for follow-up.  She has a follow-up appointment set up in the next week with her primary care physician.    Please note that this chart was generated using Scientist, clinical (histocompatibility and immunogenetics). Although every effort was made to ensure the accuracy of this automated transcription, some errors in transcription may have occurred.

## 2014-03-05 ENCOUNTER — Encounter

## 2014-03-05 MED ORDER — FLUOCINONIDE 0.05 % EX CREA
0.05 % | CUTANEOUS | Status: DC
Start: 2014-03-05 — End: 2015-02-11

## 2014-03-12 ENCOUNTER — Encounter

## 2014-03-12 MED ORDER — ALPRAZOLAM 0.5 MG PO TABS
0.5 MG | ORAL_TABLET | Freq: Two times a day (BID) | ORAL | Status: DC | PRN
Start: 2014-03-12 — End: 2014-04-02

## 2014-03-12 MED ORDER — NYSTATIN 100000 UNIT/GM EX POWD
100000 UNIT/GM | CUTANEOUS | Status: DC
Start: 2014-03-12 — End: 2014-11-12

## 2014-03-12 NOTE — Progress Notes (Signed)
Patient is still having swelling in both legs, hands, and arms.  The swelling has not gotten any better.

## 2014-03-12 NOTE — Care Coordination-Inpatient (Signed)
MITA VALLO  03/12/2014                Nurse Care Coordinator Progress Note    Assessment: Brantley is a 58 y.o. female.  Met with patient in office.  States she has made f/u appt with bariatric surgeon for evaluation of abd. Pain.  She states she has lost 6# in the past 2 weeks by watching her diet very closely.  She is wanting to get this issue taken care of prior to meeting with dietician.  She is also concerned about some chronic joint pain issues.  States PCP told her it could be fibromyalgia or RhA.  States she is not sure she wants to know!     Goals    ??? Exercise 3x per week (30 min per time)     ??? Weight < 200 lb (90.719 kg)         Barriers to meeting goals: impairment:  physical: chronic pain, overwhelmed by complexity of regimen, stress and lack of education    Medication Reconciliation performed: Yes    Action:  - gave patient affirmation for weight loss. Encouraged maintaining this regimen.  Encouraged ff with whatever testing might be needed for RhA or Fibromyalgia to ensure proper tx.      Plan of Care:  -follow as patient goes through necessary steps to get dx for abd pain.  Assist with education/ needs as needed. Set appt with dietician when patient is ready.      Arlice Colt  Nurse Care Coordinator

## 2014-03-15 NOTE — Progress Notes (Signed)
The patient is s/p band removal for an erosion.      She has been doing well, but developed left sided pain at her old port site.    On exam, there is mild tenderness.  I do not feel an obvious hernia, but with her high BMI, could still be a hernia      Will get a CT scan of abdomen to confirm for sure.    I spent a total of 20 minutes with the a patient and  Greater than 50% was face to face counseling time with the patient to discuss the issues covered in this note.

## 2014-03-16 MED ORDER — MONTELUKAST SODIUM 10 MG PO TABS
10 MG | ORAL_TABLET | ORAL | Status: DC
Start: 2014-03-16 — End: 2015-04-15

## 2014-03-21 NOTE — Telephone Encounter (Signed)
CT scan showed no hernia

## 2014-03-21 NOTE — Telephone Encounter (Signed)
Pt states she had a CT scan done on 4/13 per Dr.Northup and is now calling to see if the results are back. Pls advise.     Pt# [phone]

## 2014-03-21 NOTE — Telephone Encounter (Signed)
I called pt back and informed her of msg below, she wants to know what it is then, why she's having pain and what she should do now or what the next step would be, she states she started having this pain only after sx.

## 2014-03-22 NOTE — Telephone Encounter (Signed)
Pt is calling very emotional and upset because she feels like no one is listening to her  She says she has been having pain in her left side since last year (7/30) when her port was taken out   Pt says TS referred her to Dr. Nicole Cella (whom pt does not like) and he did a ct that was normal    Pt is calling saying something is wrong and she needs help finding out why she is in pain    Please advise

## 2014-03-23 NOTE — Telephone Encounter (Signed)
Advised pt to make an appt to discuss.

## 2014-03-26 NOTE — Progress Notes (Signed)
Subjective:      Patient ID: Rachael Mays is a 58 y.o. female.    HPI     Left sided abdominal pain persists.  Symptoms are intermittent, worse with bending.  Aching and burning at rest but sharp with bending.  No fever.  Pain since surgical removal of lap band several months ago.  Saw Dr. Nicole Cella for persisent pain and CT abdomen/pelvis was performed but showed only fatty infiltration of the liver.    Patient Active Problem List   Diagnosis   ??? Back Pain   ??? Deep Vein Thrombosis   ??? Depression   ??? High Cholesterol   ??? Hypertension   ??? Pulmonary Embolism   ??? Nausea & vomiting   ??? Abdominal  pain, other specified site   ??? Morbid obesity ([place])   ??? Status post gastric banding   ??? Vertigo   ??? Dizziness   ??? Tinnitus, subjective   ??? Meniere syndrome   ??? Complication of gastric banding   ??? Wound infection ([place])   ??? HTN (hypertension)   ??? Hyperglycemia       Outpatient Prescriptions Marked as Taking for the 03/26/14 encounter (Office Visit) with Harvel Quale, MD   Medication Sig Dispense Refill   ??? montelukast (SINGULAIR) 10 MG tablet TAKE ONE TABLET BY MOUTH DAILY  90 tablet  3   ??? nystatin (MYCOSTATIN) 100000 UNIT/GM powder Apply 3 times daily.  3 Bottle  5   ??? ALPRAZolam (XANAX) 0.5 MG tablet Take 1 tablet by mouth 2 times daily as needed for Anxiety.  60 tablet  0   ??? fluocinonide (LIDEX) 0.05 % cream Apply topically 2 times daily.  1 Tube  1   ??? cloNIDine (CATAPRES) 0.1 MG/24HR Place 1 patch onto the skin once a week.  4 patch  5   ??? ondansetron (ZOFRAN ODT) 4 MG disintegrating tablet Take 1 tablet by mouth every 8 hours as needed for Nausea or Vomiting.  90 tablet  0   ??? DULoxetine (CYMBALTA) 60 MG capsule Take 1 capsule by mouth daily.  30 capsule  3   ??? traMADol (ULTRAM) 50 MG tablet Take 2 tablets by mouth 3 times daily as needed for Pain.  60 tablet  0   ??? lidocaine (LIDODERM) 5 % Place 1 patch onto the skin daily. 12 hours on, 12 hours off.  30 patch  6   ??? acetaminophen 650 MG TABS Take 650 mg by  mouth every 4 hours as needed.  120 tablet  3   ??? sucralfate (CARAFATE) 1 GM tablet Take 1 tablet by mouth every 6 hours.  120 tablet  3   ??? omeprazole (PRILOSEC) 40 MG capsule Take 1 capsule by mouth daily.  30 capsule  3   ??? cetirizine (ZYRTEC ALLERGY) 10 MG tablet Take 10 mg by mouth daily.       ??? albuterol (PROVENTIL;VENTOLIN) 90 MCG/ACT inhaler Inhale 2 puffs into the lungs every 6 hours as needed.           Allergies   Allergen Reactions   ??? Talwin [Pentazocine] Other (See Comments)     dizzy       History   Substance Use Topics   ??? Smoking status: Former Smoker -- 0.50 packs/day for 35 years     Types: Cigarettes     Quit date: 08/07/2012   ??? Smokeless tobacco: Never Used   ??? Alcohol Use: No  BP 142/94    Ht 5\' 6"  (1.676 m)    Wt 277 lb 6.4 oz (125.828 kg)    BMI 44.79 kg/m2      Breastfeeding? No           Review of Systems    Objective:   Physical Exam   Constitutional: She is oriented to person, place, and time. She appears well-developed and well-nourished. No distress.   Cardiovascular: Normal rate, regular rhythm and normal heart sounds.    No murmur heard.  Pulmonary/Chest: Effort normal and breath sounds normal. She has no wheezes. She has no rales.   Abdominal: Soft. Bowel sounds are normal. She exhibits no mass. There is no tenderness. There is no rebound and no guarding.   Obese; no tenderness with palpation, but patient indicates that pain is in left mid abdomen when it does occur   Neurological: She is alert and oriented to person, place, and time.   Psychiatric: She has a normal mood and affect. Her behavior is normal.       Assessment:     1. Abdominal pain        Plan:   Exam reassuring.  CT abdomen/pelvis shows no cause for pain.  Patient to discuss with pain mgmt MD at next appt.

## 2014-03-26 NOTE — Progress Notes (Signed)
Patient is here to go over CT results which are in the chart.  Patient is still having pain on the left side of her abdomen where she had a port in the past.

## 2014-03-28 NOTE — Telephone Encounter (Signed)
May be just scar tissue.  We can have her follow up in the office in a month and see if there is any change.

## 2014-03-29 NOTE — Telephone Encounter (Signed)
Spoke with patient that it may be scar tissue, discussed with Dr. Nicole Cella and he asked that if she is still not feeling better to return to the office in a month to see him.

## 2014-03-29 NOTE — ED Provider Notes (Signed)
[facility]  Emergency Department Encounter    Pt Name: Rachael Mays  MRN: [id number]  [place] 04-06-1956  Date of evaluation: 03/29/2014  Provider: Trenda Moots, PA    Chief Complaint   Patient presents with   ??? Leg Pain     patient c/o left leg pain that started yesterday. states she has hx of DVT.        Nursing Notes, Past Medical Hx, Past Surgical Hx, Social Hx, Allergies, and Family Hx were all reviewed and agreed with or any disagreements were addressed in the HPI.    HPI:  (Location, Duration, Timing, Severity, Quality, Assoc Sx, Context, Modifying factors)  This is a  58 y.o. female presents to the emergency department with left lower leg pain.  The patient states that she started noticing swelling on Monday.  She did see Dr. Gillermo Murdoch gave her Lasix, but this did not help.  She states then she felt a tearing sensation in her calf had an 8 out of 10 burning/tingling pain since.  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  She states that she is a former smoker, but denies immobilization, recent surgery and recent travel.  She is not on any hormones and has no history of malignancy.  Denies any trauma to her left lower extremity.  She states that it is mildly swollen and tender.  Patient also states she is having shortness of breath, but she has asthma.  She does use Singulair and albuterol inhaler.  She did not use her albuterol inhaler.  Denies wheezing, cough, rhinorrhea and congestion.  She states that she's also had intermittent chest pain or shortness of breath is not related.  She denies any exertional or pleuritic aspect.  She states last time she had chest pain was yesterday.  Denies visual changes, nausea, vomiting, weakness and numbness with her pain.  She states that she does get sweaty at times with it.  She does have a history of hypertension and use Klonopin patches for this.  She states  that her mother had a heart attack at the age of 14 and died from that.  Denies any other personal or family history of heart disease.  She did have a heart catheterization done May 26, 2012 here that was completely normal with ejection fraction of 65%.    Past Medical/Surgical History:      Diagnosis Date   ??? Anxiety    ??? Asthma    ??? Chronic back pain    ??? Deep vein thrombosis    ??? Depression    ??? GERD (gastroesophageal reflux disease)      NO LONGER SINCE LAP   ??? Pulmonary embolism (HCC)    ??? Obesity      hx of; had lap band   ??? Hyperlipidemia      hx; resolved with lap band   ??? GERD (gastroesophageal reflux disease) 01/23/2009   ??? Hypertension          Procedure Laterality Date   ??? Colonoscopy     ??? Upper gastrointestinal endoscopy     ??? Cesarean section     ??? Cholecystectomy     ??? Hysterectomy     ??? Shoulder surgery     ??? Lap band  05/01/08     Dr. Nicole Cella   ??? Back surgery  neck  plates   ??? Colonoscopy  04/08/10   ??? Spine surgery     ??? Other surgical history       [place] Filter: curently in place   ??? Upper gastrointestinal endoscopy  05/19/13   ??? Other surgical history  07/05/13     lap band removal   ??? Upper gastrointestinal endoscopy  07/21/13     ESOPHAGEAL STENT PLACEMENT   ??? Upper gastrointestinal endoscopy  07/21/13     WITH EXCHANGE OF ESOPHAGEAL STENT   ??? Partial hysterectomy         Medications:  Previous Medications    ACETAMINOPHEN 650 MG TABS    Take 650 mg by mouth every 4 hours as needed.    ALBUTEROL (PROVENTIL;VENTOLIN) 90 MCG/ACT INHALER    Inhale 2 puffs into the lungs every 6 hours as needed.    ALPRAZOLAM (XANAX) 0.5 MG TABLET    Take 1 tablet by mouth 2 times daily as needed for Anxiety.    CETIRIZINE (ZYRTEC ALLERGY) 10 MG TABLET    Take 10 mg by mouth daily.    CLONIDINE (CATAPRES) 0.1 MG/24HR    Place 1 patch onto the skin once a week.    DULOXETINE (CYMBALTA) 60 MG CAPSULE    Take 1 capsule by mouth daily.    FLUOCINONIDE (LIDEX) 0.05 % CREAM    Apply topically 2 times daily.     HYDROCODONE-ACETAMINOPHEN (VICODIN) 5-300 MG TABS    Take by mouth 3 times daily    LIDOCAINE (LIDODERM) 5 %    Place 1 patch onto the skin daily. 12 hours on, 12 hours off.    METHYLPREDNISOLONE (MEDROL) 4 MG TABLET    Take 4 mg by mouth daily    MONTELUKAST (SINGULAIR) 10 MG TABLET    TAKE ONE TABLET BY MOUTH DAILY    NYSTATIN (MYCOSTATIN) 100000 UNIT/GM POWDER    Apply 3 times daily.    OMEPRAZOLE (PRILOSEC) 40 MG CAPSULE    Take 1 capsule by mouth daily.    ONDANSETRON (ZOFRAN ODT) 4 MG DISINTEGRATING TABLET    Take 1 tablet by mouth every 8 hours as needed for Nausea or Vomiting.    SUCRALFATE (CARAFATE) 1 GM TABLET    Take 1 tablet by mouth every 6 hours.    TRAMADOL (ULTRAM) 50 MG TABLET    Take 2 tablets by mouth 3 times daily as needed for Pain.         Review of Systems   Constitutional: Negative for fever and chills.   HENT: Negative for congestion and rhinorrhea.    Eyes: Negative for visual disturbance.   Respiratory: Positive for shortness of breath. Negative for cough and wheezing.    Cardiovascular: Positive for chest pain, palpitations and leg swelling.   Gastrointestinal: Negative for nausea, vomiting and abdominal pain.   Musculoskeletal: Negative for gait problem.        Left leg pain   Skin: Negative for rash and wound.   Neurological: Negative for dizziness, syncope, weakness, light-headedness and headaches.   All other systems reviewed and are negative.      Physical Exam   Constitutional: She is oriented to person, place, and time. She appears well-developed and well-nourished. No distress.   HENT:   Head: Normocephalic and atraumatic.   Right Ear: External ear normal.   Left Ear: External ear normal.   Cardiovascular: Normal rate, regular rhythm, normal heart sounds and intact distal pulses.  Exam reveals no gallop and no  friction rub.    No murmur heard.  Pulmonary/Chest: Effort normal and breath sounds normal. No respiratory distress. She has no wheezes. She has no rales. She exhibits  tenderness (extremely tender over sternum where the patient states she is having her chest pain).   Abdominal: Soft. Normal appearance and bowel sounds are normal. She exhibits no distension and no mass. There is no tenderness. There is no rigidity, no rebound, no guarding, no CVA tenderness, no tenderness at McBurney's point and negative Murphy's sign.   Musculoskeletal: Normal range of motion. She exhibits no edema.        Left lower leg: She exhibits tenderness and swelling (mild). She exhibits no bony tenderness, no edema, no deformity and no laceration.   Neurological: She is alert and oriented to person, place, and time.   Skin: Skin is warm and dry. No rash noted. She is not diaphoretic.   Psychiatric: She has a normal mood and affect. Her behavior is normal. Thought content normal.   Nursing note and vitals reviewed.      Procedures  N/A    MDM  MEDICAL DECISION MAKING    Vitals:    Filed Vitals:    03/29/14 2230   BP: 178/103   Pulse: 97   Temp: 98.4 ??F (36.9 ??C)   TempSrc: Oral   Resp: 15   Height: 5\' 6"  (1.676 m)   Weight: 250 lb (113.399 kg)   SpO2: 96%       LABS:   Labs Reviewed   CBC WITH AUTO DIFFERENTIAL - Abnormal; Notable for the following:     RDW 16.3 (*)     All other components within normal limits   BASIC METABOLIC PANEL - Abnormal; Notable for the following:     Glucose 163 (*)     All other components within normal limits   D-DIMER, QUANTITATIVE - Abnormal; Notable for the following:     D-Dimer, Quant 535 (*)     All other components within normal limits   TROPONIN        Remainder of labs reviewed and were negative at this time or not returned at the time of this note.    Orders:  Orders Placed This Encounter   Procedures   ??? XR Chest Standard TWO VW   ??? CTA Pulmonary With Contrast   ??? VL Extremity Venous Left   ??? CBC Auto Differential   ??? Basic Metabolic Panel   ??? Troponin   ??? D-Dimer, Quantitative   ??? EKG 12 Lead   ??? Saline lock IV       EKG: Interpreted by the attending and myself in  the absence of a real-time interpretation by the cardiologist.   EKG shows normal sinus rhythm with a ventricular rate of 92 beats per minute.  Intervals are within normal range.  Normal axis.  Minimal voltage criteria for LVH.  No evidence for acute ischemia or ectopy.  This is compared to an old EKG done June 27, 2013 showing sinus rhythm with PACs.  No other significant difference.    RADIOLOGY:   Non-plain film images such as CT, Ultrasound and MRI are read by the radiologist. The attending, Rogelia MireMarc S Wise, MD, and I, Varney BaasJoshua J Arriona Prest, PA, have visualized the radiologic plain film image(s) with the below findings:    Interpretation per the Radiologist below, if available at the time of this note:    XR CHEST STANDARD TWO VW    Final Result: IMPRESSION:  Cardiac silhouette is at the upper limits of normal.  No acute    cardiopulmonary process identified.       CTA PULMONARY WITH CONTRAST    Final Result: IMPRESSION:     No evidence of pulmonary embolism or acute pulmonary abnormality.            Ct Abdomen Wo Iv Contrast Additional Contrast?: None    03/19/2014   CT SCAN ABDOMEN WITHOUT CONTRAST, March 19, 2014 03:09:22 PM   INDICATION:  Abdominal pain, other specified site.    COMPARISON:  09/09/2013.   FINDINGS:  There is some limitation without intravenous contrast.   Lung Bases:  Unremarkable.   Liver:  There is moderate fatty infiltration throughout the liver. Average density only 18 Hounsfield units. No focal liver lesion.   Gallbladder/Biliary:  Absent.   Spleen:  Normal.   Pancreas:  Unremarkable with noncontrast imaging.    Kidneys:  No renal calculus or hydronephrosis. Limited evaluation without intravenous contrast.     Adrenals:  Normal.   GI Tract:  There is no obstruction, inflammatory change or free fluid.    Mesentery/Retroperitoneum:  Normal   Vasculature:  Unremarkable on noncontrast imaging. IVC filter is seen unchanged.   Pelvis:  Not scanned.   Bones:  Normal for age.   Abdominal Wall:   Normal.      03/19/2014   IMPRESSION:  No acute abnormality with above findings.    Moderate to severe fatty infiltration of the liver.         PROCEDURES: N/A    CRITICAL CARE:  N/A    CONSULTATIONS: N/A    MEDICAL DECISION MAKING / ED COURSE:    Patient was given the following medications:  Medications   oxyCODONE-acetaminophen (PERCOCET) 5-325 MG per tablet 2 tablet (not administered)   aspirin chewable tablet 324 mg (324 mg Oral Given 03/29/14 2343)   sodium chloride (PF) 0.9 % injection 10 mL (10 mLs Intravenous Given 03/30/14 0034)   ioversol (OPTIRAY) 68 % injection 100 mL (125 mLs Intravenous Given 03/30/14 0034)   morphine (PF) injection 4 mg (4 mg Intravenous Given 03/30/14 0039)   enoxaparin (LOVENOX) injection 120 mg (120 mg Subcutaneous Given 03/30/14 0115)     The patient presents with chest pain and left leg pain.  She was given aspirin and IV morphine here.  EKG is stable.  Chest x-ray had results as described above.  CBC shows RDW of 16.3.  BMP shows glucose 163.  Troponin is within normal range.  D-dimer is elevated.  CT was ordered after this showing no evidence for PE.  The patient has been evaluated and the history and physical exam suggest a benign etiology.  I see nothing to suggest coronary ischemia, myocardial infarction, pulmonary embolism, thoracic aortic dissection, significant pericarditis, pneumonia, pneumothorax, or acute abdomen.  I feel the patient can be safely discharged to home with outpatient follow up.  Instructions have been given for the patient to return to the ED for any worsening of the symptoms, including but not limited to increased pain, shortness of breath, abdominal pain or weakness.  I feel that the patient most likely has atypical chest pain was likely being musculoskeletal with or tenderness on exam.    The patient did have some mild swelling as well as elevated d-dimer.  She was given Lovenox here.  There is suspicion for DVT since she does have a history of it.  No  evidence for cellulitis, erysipelas, abscess or necrotizing fasciitis.  We'll discharge the patient with an order for an outpatient ultrasound tomorrow.  She will return for any new, concerning or worsening symptoms.    The patient tolerated their visit well.  They were seen and evaluated by the attending physician who agreed with the assessment and plan.  The patient and / or the family were informed of the results of any tests, a time was given to answer questions, a plan was proposed and they agreed with plan.      CLINICAL IMPRESSION:    1. Atypical chest pain    2. Left leg pain        DISPOSITION Decision to Discharge    PATIENT REFERRED TO:  Lum Keaseresa M Slough O'Brien, MD  [address] [place]  [place] [id number]  [phone]    Call in 1 day  for follow-up       DISCHARGE MEDICATIONS:  New Prescriptions    No medications on file       (Please note the MDM and HPI sections of this note were completed with a voice recognition program.  Efforts were made to edit the dictations but occasionally words are mis-transcribed.)    Varney BaasJoshua J Jerin Franzel, PA        Varney BaasJoshua J Teshaun Olarte, [place]  03/30/14 [phone]

## 2014-03-29 NOTE — ED Notes (Signed)
Patient alert and oriented times 3. C/o "electricity" pain to LLE when touched. No swelling, no obvious deformity noted. LLE warm, +sensation. +left pedal pulse    Rayburn Go, RN  03/29/14 [phone]

## 2014-03-30 ENCOUNTER — Inpatient Hospital Stay: Admit: 2014-03-30 | Discharge: 2014-03-30 | Attending: Emergency Medicine

## 2014-03-30 LAB — EKG 12-LEAD
Atrial Rate: 92 {beats}/min
P Axis: 64 degrees
P-R Interval: 140 ms
Q-T Interval: 374 ms
QRS Duration: 76 ms
QTc Calculation (Bazett): 462 ms
R Axis: 4 degrees
T Axis: 62 degrees
Ventricular Rate: 92 {beats}/min

## 2014-03-30 LAB — BASIC METABOLIC PANEL
Anion Gap: 13 (ref 3–16)
BUN: 16 mg/dL (ref 7–20)
CO2: 24 mmol/L (ref 21–32)
Calcium: 9.5 mg/dL (ref 8.3–10.6)
Chloride: 103 mmol/L (ref 99–110)
Creatinine: 0.9 mg/dL (ref 0.6–1.1)
GFR African American: >60 (ref >60)
GFR Non-African American: >60 (ref >60)
Glucose: 163 mg/dL — ABNORMAL HIGH (ref 70–99)
Potassium: 4.3 mmol/L (ref 3.5–5.1)
Sodium: 140 mmol/L (ref 136–145)

## 2014-03-30 LAB — D-DIMER, QUANTITATIVE: D-Dimer, Quant: 535 ng/mL DDU — ABNORMAL HIGH (ref 0–229)

## 2014-03-30 LAB — CBC WITH AUTO DIFFERENTIAL
Basophils %: 0.4 %
Basophils Absolute: 0 10*3/uL (ref 0.0–0.2)
Eosinophils %: 0.1 %
Eosinophils Absolute: 0 10*3/uL (ref 0.0–0.6)
Hematocrit: 40 % (ref 36.0–48.0)
Hemoglobin: 13.1 g/dL (ref 12.0–16.0)
Lymphocytes %: 20 %
Lymphocytes Absolute: 1.8 10*3/uL (ref 1.0–5.1)
MCH: 26.5 pg (ref 26.0–34.0)
MCHC: 32.6 g/dL (ref 31.0–36.0)
MCV: 81.2 fL (ref 80.0–100.0)
MPV: 8.6 fL (ref 5.0–10.5)
Monocytes %: 4.3 %
Monocytes Absolute: 0.4 10*3/uL (ref 0.0–1.3)
Neutrophils %: 75.2 %
Neutrophils Absolute: 6.7 10*3/uL (ref 1.7–7.7)
Platelets: 211 10*3/uL (ref 135–450)
RBC: 4.93 M/uL (ref 4.00–5.20)
RDW: 16.3 % — ABNORMAL HIGH (ref 12.4–15.4)
WBC: 8.9 10*3/uL (ref 4.0–11.0)

## 2014-03-30 LAB — TROPONIN: Troponin: <0.01 ng/mL (ref <0.01)

## 2014-03-30 MED ADMIN — sodium chloride (PF) 0.9 % injection 10 mL: 10 mL | INTRAVENOUS | @ 05:00:00

## 2014-03-30 MED ADMIN — ioversol (OPTIRAY) 68 % injection 100 mL: 125 mL | INTRAVENOUS | @ 05:00:00 | NDC 00019132381

## 2014-03-30 MED ADMIN — enoxaparin (LOVENOX) injection 120 mg: 120 mg/kg | SUBCUTANEOUS | @ 05:00:00 | NDC 00075802201

## 2014-03-30 MED ADMIN — aspirin chewable tablet 324 mg: 324 mg | ORAL | @ 04:00:00 | NDC 63739043401

## 2014-03-30 MED ADMIN — oxyCODONE-acetaminophen (PERCOCET) 5-325 MG per tablet 2 tablet: 2 | ORAL | @ 06:00:00 | NDC 00406051223

## 2014-03-30 MED ADMIN — morphine (PF) injection 4 mg: 4 mg | INTRAVENOUS | @ 05:00:00 | NDC 00409189101

## 2014-03-30 MED FILL — OXYCODONE-ACETAMINOPHEN 5-325 MG PO TABS: 5-325 MG | ORAL | Qty: 2

## 2014-03-30 MED FILL — LOVENOX 120 MG/0.8ML SC SOLN: 120 MG/0.8ML | SUBCUTANEOUS | Qty: 0.8

## 2014-03-30 MED FILL — ASPIRIN 81 MG PO CHEW: 81 MG | ORAL | Qty: 4

## 2014-03-30 MED FILL — NORMAL SALINE FLUSH 0.9 % IV SOLN: 0.9 % | INTRAVENOUS | Qty: 10

## 2014-03-30 MED FILL — MORPHINE SULFATE (PF) 4 MG/ML IV SOLN: 4 mg/mL | INTRAVENOUS | Qty: 1

## 2014-03-30 NOTE — Discharge Instructions (Signed)
Chest Pain: After Your Visit  Your Care Instructions  There are many things that can cause chest pain. Some are not serious and will get better on their own in a few days. But some kinds of chest pain need more testing and treatment. Your doctor may have recommended a follow-up visit in the next 8 to 12 hours. If you are not getting better, you may need more tests or treatment.  Even though your doctor has released you, you still need to watch for any problems. The doctor carefully checked you, but sometimes problems can develop later. If you have new symptoms or if your symptoms do not get better, get medical care right away.  If you have worse or different chest pain or pressure that lasts more than 5 minutes or you passed out (lost consciousness), call 911 or seek other emergency help right away.   A medical visit is only one step in your treatment. Even if you feel better, you still need to do what your doctor recommends, such as going to all suggested follow-up appointments and taking medicines exactly as directed. This will help you recover and help prevent future problems.  How can you care for yourself at home?   Rest until you feel better.   Take your medicine exactly as prescribed. Call your doctor if you think you are having a problem with your medicine.   Do not drive after taking a prescription pain medicine.  When should you call for help?  Call 911 if:    You passed out (lost consciousness).   You have severe difficulty breathing.   You have symptoms of a heart attack. These may include:   Chest pain or pressure, or a strange feeling in your chest.   Sweating.   Shortness of breath.   Nausea or vomiting.   Pain, pressure, or a strange feeling in your back, neck, jaw, or upper belly or in one or both shoulders or arms.   Lightheadedness or sudden weakness.   A fast or irregular heartbeat.  After you call 911, the operator may tell you to chew 1 adult-strength or 2 to 4 low-dose aspirin.  Wait for an ambulance. Do not try to drive yourself.  Call your doctor today if:    You have any trouble breathing.   Your chest pain gets worse.   You are dizzy or lightheaded, or you feel like you may faint.   You are not getting better as expected.   You are having new or different chest pain.   Where can you learn more?   Go to [email or web address] and sign in to your MyChart account. Enter A120 in the [place] box to learn more about "Chest Pain: After Your Visit."    If you do not have an account, please click on the "Sign Up Now" link.      2006-2015 [employer]. Care instructions adapted under license by [facility]. This care instruction is for use with your licensed healthcare professional. If you have questions about a medical condition or this instruction, always ask your healthcare professional. [place] any warranty or liability for your use of this information.  Content Version: 10.4.390249; Current as of: May 10, 2013                  Leg Pain: After Your Visit  Your Care Instructions  Many things can cause leg pain. Too much exercise or overuse can cause a muscle cramp (or charley  horse). You can get leg cramps from not eating a balanced diet that has enough potassium, calcium, and other minerals. If you do not drink enough fluids or are taking certain medicines, you may develop leg cramps. Other causes of leg pain include injuries, blood flow problems, nerve damage, and twisted and enlarged veins (varicose veins).  You can usually ease pain with self-care. Your doctor may recommend that you rest your leg and keep it elevated.  Follow-up care is a key part of your treatment and safety. Be sure to make and go to all appointments, and call your doctor if you are having problems. It's also a good idea to know your test results and keep a list of the medicines you take.  How can you care for yourself at home?   Take pain  medicines exactly as directed.   If the doctor gave you a prescription medicine for pain, take it as prescribed.   If you are not taking a prescription pain medicine, ask your doctor if you can take an over-the-counter medicine.   Take any other medicines exactly as prescribed. Call your doctor if you think you are having a problem with your medicine.   Rest your leg while you have pain, and avoid standing for long periods of time.   Prop up your leg at or above the level of your heart when possible.   Make sure you are eating a balanced diet that is rich in calcium, potassium, and magnesium, especially if you are pregnant.   If directed by your doctor, put ice or a cold pack on the area for 10 to 20 minutes at a time. Put a thin cloth between the ice and your skin.   Your leg may be in a splint, a brace, or an elastic bandage, and you may have crutches to help you walk. Follow your doctor's directions about how long to wear supports and how to use the crutches.  When should you call for help?  Call 911 anytime you think you may need emergency care. For example, call if:   You have sudden chest pain and shortness of breath, or you cough up blood.   Your leg is cool or pale or changes color.  Call your doctor now or seek immediate medical care if:   You have increasing or severe pain.   Your leg suddenly feels weak and you cannot move it.   You have signs of a blood clot, such as:   Pain in your calf, back of the knee, thigh, or groin.   Redness and swelling in your leg or groin.   You have signs of infection, such as:   Increased pain, swelling, warmth, or redness.   Red streaks leading from the sore area.   Pus draining from a place on your leg.   A fever.   You cannot bear weight on your leg.  Watch closely for changes in your health, and be sure to contact your doctor if:   You do not get better as expected.   Where can you learn more?   Go to [email or web address] and sign  in to your MyChart account. Enter [phone] in the [place] box to learn more about "Leg Pain: After Your Visit."    If you do not have an account, please click on the "Sign Up Now" link.      2006-2015 [employer]wise, [employer]. Care instructions adapted under license by [facility]. This care instruction is for use with  your licensed healthcare professional. If you have questions about a medical condition or this instruction, always ask your healthcare professional. [employer]wise, Inc[employer] disclaims any warranty or liability for your use of this information.  Content Version: 10.4.390249; Current as of: October 20, 2013

## 2014-03-30 NOTE — ED Provider Notes (Signed)
I have personally performed and/or participated in the history, exam and medical decision making and agree with all pertinent clinical information.  I have also reviewed and agree with the past medical, family and social history unless otherwise noted.    Patient presents with some leg pain and swelling but also some chest discomfort. She has no evidence of pulmonary embolism on her chest CT. She has had a heart catheterization not too long ago that showed normal coronary arteries. There is no reason to suspect that her chest pain is cardiac at this time. She presents during the evening hours when Doppler ultrasound is unavailable. We will give her Lovenox and bring her back in the morning for the ultrasound.    Sadie Haber, MD  03/30/14 [phone]

## 2014-04-02 MED ORDER — ESCITALOPRAM OXALATE 20 MG PO TABS
20 MG | ORAL_TABLET | Freq: Every day | ORAL | Status: DC
Start: 2014-04-02 — End: 2014-05-07

## 2014-04-02 MED ORDER — ALPRAZOLAM 0.5 MG PO TABS
0.5 MG | ORAL_TABLET | Freq: Two times a day (BID) | ORAL | Status: DC | PRN
Start: 2014-04-02 — End: 2014-10-31

## 2014-04-02 MED ORDER — DULOXETINE HCL 30 MG PO CPEP
30 MG | ORAL_CAPSULE | Freq: Every day | ORAL | Status: DC
Start: 2014-04-02 — End: 2014-05-04

## 2014-04-02 NOTE — Telephone Encounter (Signed)
Please call pt with doppler results performed 4/24 @ [facility]

## 2014-04-02 NOTE — Progress Notes (Signed)
Psychiatry Consultation  Loralie Champagne., M.D.  04/02/2014  1:06 PM      Time spent with Patient: 60 min    Controlled Substances Monitoring: Attestation: The Prescription Monitoring Report for this patient was reviewed today. Nori Riis Delsa Grana., MD)  Documentation: No signs of potential drug abuse or diversion identified. Nori Riis Delsa Grana., MD)    Reason for Consult:  depression  Referring Provider: Harvel Quale, [place] [address]       Recommendations:    ?? Will switch from Cymbalta back to Lexapro and reassess.    Diagnosis:    Axis I  Major depressive disorder; recurrent    Axis II  Coping Skills:  fair    Axis III       Diagnosis Date   ??? Anxiety    ??? Asthma    ??? Chronic back pain    ??? Deep vein thrombosis    ??? Depression    ??? GERD (gastroesophageal reflux disease)      NO LONGER SINCE LAP   ??? Pulmonary embolism (HCC)    ??? Obesity      hx of; had lap band   ??? Hyperlipidemia      hx; resolved with lap band   ??? GERD (gastroesophageal reflux disease) 01/23/2009   ??? Hypertension         Axis IV  Other psychosocial and environmental problems    Axis V  51-60 moderate symptoms    Examination:      Vital Signs BP 162/102    Pulse 86    Ht 5\' 6"  (1.676 m)    Wt 275 lb (124.739 kg)    BMI 44.41 kg/m2       Appearance    alert, cooperative, moderate distress  Speech    spontaneous, normal rate, normal volume and well articulated  Mood    depressed  Affect    depressed affect  Thought Content    intact  Thought Process    linear, goal directed and coherent  Associations    logical connections  Insight    Fair  Judgment    Intact  No abnormal movements, tics or mannerisms.    Orientation    oriented to person, place, time, and general circumstances  Memory    recent and remote memory intact  Attention/Concentration    intact  Language    0 - no aphasia, normal  Fund of Knowledge    intact  Suicide Assessment    no suicidal ideation    Clinical Interview: Ms. Bernardy  presents with a history of depression previously treated with Lexapro, but she is currently prescribed Cymbalta which has not been working for either depression or pain.  She has experienced a number of setbacks from medical issues including a failed lap band originally done 6 years ago that ended up with complications that resulted in an extended hospital stay more recently because it had to be removed.  She has also been experiencing claudication in her left calf with intermittent unilateral edema but a negative doppler.  The pain seems to go away at rest and is exacerbated by movement which suggests more of a vascular character along with the reported swelling of the extremity.  She discussed some of the frustration she has experiecned with this problem because she felt as if she had not been heard regarding her concerns, an that she had been moved from provider  to provider.  I did review the ultrasound with her and helped her understand why the doctor had not yet initiated treatment, but advised her that if her condition would worsen, she should have it looked at again.  Al lot of her distress was directly connected to the weight gain from the reversal of her lap band surgery, chronic pain since that hospitalization, and a decrease in activity.  She described anhedonia, low energy, poor focus and memory, and sad mood persisting since then.  She said that she had a better response initially with Lexapro, and that she had fallen back into depression while taking Cymbalta.    I discussed that even a partial effect from Lexapro could help, and advised that we go back to Lexapro and consider augmentation if it achieves only a partial response at maximum dose.  Will reassess in 4-6 weeks.    HPI:     Location: brain    Duration: chronic    Timing: continuous    Severity: moderate    Quality: sad mood, poor energy, poor focus (memory problems), lack of motivation, anhedonia    Assoc Sx: as above    Context: not  responding to current medication    Modifying factors: failure of lap band surgery and subsequent complications      History:      Past Medical History:    Past Medical History   Diagnosis Date   ??? Anxiety    ??? Asthma    ??? Chronic back pain    ??? Deep vein thrombosis    ??? Depression    ??? GERD (gastroesophageal reflux disease)      NO LONGER SINCE LAP   ??? Pulmonary embolism (HCC)    ??? Obesity      hx of; had lap band   ??? Hyperlipidemia      hx; resolved with lap band   ??? GERD (gastroesophageal reflux disease) 01/23/2009   ??? Hypertension        Past Surgical History:    Past Surgical History   Procedure Laterality Date   ??? Colonoscopy     ??? Upper gastrointestinal endoscopy     ??? Cesarean section     ??? Cholecystectomy     ??? Hysterectomy     ??? Shoulder surgery     ??? Lap band  05/01/08     Dr. Suzi [facility]   ??? Back surgery       neck  plates   ??? Colonoscopy  04/08/10   ??? Spine surgery     ??? Other surgical history       [place] Filter: curently in place   ??? Upper gastrointestinal endoscopy  05/19/13   ??? Other surgical history  07/05/13     lap band removal   ??? Upper gastrointestinal endoscopy  07/21/13     ESOPHAGEAL STENT PLACEMENT   ??? Upper gastrointestinal endoscopy  07/21/13     WITH EXCHANGE OF ESOPHAGEAL STENT   ??? Partial hysterectomy         Medications:   Current Outpatient Prescriptions   Medication Sig Dispense Refill   ??? methylPREDNISolone (MEDROL) 4 MG tablet Take 4 mg by mouth daily       ??? HYDROcodone-acetaminophen (VICODIN) 5-300 MG TABS Take by mouth 3 times daily       ??? montelukast (SINGULAIR) 10 MG tablet TAKE ONE TABLET BY MOUTH DAILY  90 tablet  3   ??? nystatin (MYCOSTATIN) 100000 UNIT/GM powder Apply 3 times  daily.  3 Bottle  5   ??? ALPRAZolam (XANAX) 0.5 MG tablet Take 1 tablet by mouth 2 times daily as needed for Anxiety.  60 tablet  0   ??? fluocinonide (LIDEX) 0.05 % cream Apply topically 2 times daily.  1 Tube  1   ??? cloNIDine (CATAPRES) 0.1 MG/24HR Place 1 patch onto the skin once a week.  4 patch  5   ???  ondansetron (ZOFRAN ODT) 4 MG disintegrating tablet Take 1 tablet by mouth every 8 hours as needed for Nausea or Vomiting.  90 tablet  0   ??? DULoxetine (CYMBALTA) 60 MG capsule Take 1 capsule by mouth daily.  30 capsule  3   ??? traMADol (ULTRAM) 50 MG tablet Take 2 tablets by mouth 3 times daily as needed for Pain.  60 tablet  0   ??? acetaminophen 650 MG TABS Take 650 mg by mouth every 4 hours as needed.  120 tablet  3   ??? sucralfate (CARAFATE) 1 GM tablet Take 1 tablet by mouth every 6 hours.  120 tablet  3   ??? omeprazole (PRILOSEC) 40 MG capsule Take 1 capsule by mouth daily.  30 capsule  3   ??? cetirizine (ZYRTEC ALLERGY) 10 MG tablet Take 10 mg by mouth daily.       ??? albuterol (PROVENTIL;VENTOLIN) 90 MCG/ACT inhaler Inhale 2 puffs into the lungs every 6 hours as needed.       ??? lidocaine (LIDODERM) 5 % Place 1 patch onto the skin daily. 12 hours on, 12 hours off.  30 patch  6     No current facility-administered medications for this visit.       Allergies:  Talwin    Social History:   History     Social History   ??? Marital Status: Married     Spouse Name: N/A     Number of Children: N/A   ??? Years of Education: N/A     Occupational History   ??? Not on file.     Social History Main Topics   ??? Smoking status: Former Smoker -- 0.50 packs/day for 35 years     Types: Cigarettes     Quit date: 08/07/2012   ??? Smokeless tobacco: Never Used   ??? Alcohol Use: No   ??? Drug Use: No   ??? Sexual Activity: Not on file     Other Topics Concern   ??? Not on file     Social History Narrative    ** Merged History Encounter **        TOBACCO:   reports that she quit smoking about 19 months ago. Her smoking use included Cigarettes. She has a 17.5 pack-year smoking history. She has never used smokeless tobacco.  ETOH:   reports that she does not drink alcohol.    Family History:   Family History   Problem Relation Age of Onset   ??? Arthritis Mother    ??? Cancer Mother    ??? Depression Mother    ??? High Blood Pressure Mother    ??? Heart Disease  Maternal Grandmother    ??? Early Death Paternal Grandmother    ??? Heart Disease Paternal Grandfather    ??? Diabetes Other    ??? High Blood Pressure Other    ??? Obesity Other    ??? Heart Disease Mother 55     MI    ??? Ovarian Cancer Mother 12   ??? Cancer Father 84  colon   ??? Cancer Mother

## 2014-04-02 NOTE — Telephone Encounter (Signed)
Results in chart,please advise.

## 2014-04-02 NOTE — Telephone Encounter (Signed)
Patient was not called with doppler result from our office because we did not order it.  Result should have been called by ordering physician.  Dr. Venetia Constable was concerned about possible DVT though doppler looked negative.  Will need to see vascular surgery.

## 2014-04-03 NOTE — Telephone Encounter (Signed)
lmtc

## 2014-04-04 NOTE — Telephone Encounter (Signed)
Pt notified and was referred to Dr. Mellody Drown.

## 2014-04-10 MED ORDER — HYDROCHLOROTHIAZIDE 25 MG PO TABS
25 MG | ORAL_TABLET | Freq: Every day | ORAL | Status: DC
Start: 2014-04-10 — End: 2015-06-03

## 2014-04-10 MED ORDER — LISINOPRIL 10 MG PO TABS
10 MG | ORAL_TABLET | Freq: Every day | ORAL | Status: DC
Start: 2014-04-10 — End: 2015-05-15

## 2014-04-10 NOTE — Progress Notes (Signed)
Subjective:      Patient ID: Rachael Mays is a 58 y.o. female.    HPI     Patient has had high blood pressure.  Not checking at home.  No cp or sob.    Patient was seen 03/29/2014 for pain in left lower extremity.  Hx of remote DVT not on anticoagulation.  CT chest was negative for PE.  Treated with dose of lovenox and doppler left lower extremity ordered the following day which showed not DVT.  Still with pain the the left lower extremity at times.  She has been asked to see Dr. Mellody Drown for opinion.    Patient Active Problem List   Diagnosis   ??? Back Pain   ??? Deep Vein Thrombosis   ??? Depression   ??? High Cholesterol   ??? Hypertension   ??? Pulmonary Embolism   ??? Nausea & vomiting   ??? Abdominal  pain, other specified site   ??? Morbid obesity ([place])   ??? Status post gastric banding   ??? Vertigo   ??? Dizziness   ??? Tinnitus, subjective   ??? Meniere syndrome   ??? Complication of gastric banding   ??? Wound infection ([place])   ??? HTN (hypertension)   ??? Hyperglycemia   ??? Chest pain       Outpatient Prescriptions Marked as Taking for the 04/10/14 encounter (Office Visit) with Harvel Quale, MD   Medication Sig Dispense Refill   ??? hydrochlorothiazide (HYDRODIURIL) 25 MG tablet Take 1 tablet by mouth daily  90 tablet  3   ??? lisinopril (PRINIVIL;ZESTRIL) 10 MG tablet Take 1 tablet by mouth daily  90 tablet  3   ??? ALPRAZolam (XANAX) 0.5 MG tablet Take 1 tablet by mouth 2 times daily as needed for Anxiety  60 tablet  2   ??? [DISCONTINUED] DULoxetine (CYMBALTA) 30 MG capsule Take 1 capsule by mouth daily Take 30mg  daily for one week, then discontinue of tolerated.  14 capsule  0   ??? [DISCONTINUED] escitalopram (LEXAPRO) 20 MG tablet Take 1 tablet by mouth daily Take 0.5 tablets by mouth daily for a week then increase to a full tablet daily on week 2.  30 tablet  3   ??? [DISCONTINUED] methylPREDNISolone (MEDROL) 4 MG tablet Take 4 mg by mouth daily       ??? [DISCONTINUED] HYDROcodone-acetaminophen (VICODIN) 5-300 MG TABS Take by mouth 3  times daily       ??? montelukast (SINGULAIR) 10 MG tablet TAKE ONE TABLET BY MOUTH DAILY  90 tablet  3   ??? nystatin (MYCOSTATIN) 100000 UNIT/GM powder Apply 3 times daily.  3 Bottle  5   ??? fluocinonide (LIDEX) 0.05 % cream Apply topically 2 times daily.  1 Tube  1   ??? [DISCONTINUED] ondansetron (ZOFRAN ODT) 4 MG disintegrating tablet Take 1 tablet by mouth every 8 hours as needed for Nausea or Vomiting.  90 tablet  0   ??? [DISCONTINUED] traMADol (ULTRAM) 50 MG tablet Take 2 tablets by mouth 3 times daily as needed for Pain.  60 tablet  0   ??? [DISCONTINUED] lidocaine (LIDODERM) 5 % Place 1 patch onto the skin daily. 12 hours on, 12 hours off.  30 patch  6   ??? acetaminophen 650 MG TABS Take 650 mg by mouth every 4 hours as needed.  120 tablet  3   ??? [DISCONTINUED] sucralfate (CARAFATE) 1 GM tablet Take 1 tablet by mouth every 6 hours.  120 tablet  3   ??? omeprazole (PRILOSEC) 40 MG capsule Take 1 capsule by mouth daily.  30 capsule  3   ??? cetirizine (ZYRTEC ALLERGY) 10 MG tablet Take 10 mg by mouth daily.       ??? [DISCONTINUED] albuterol (PROVENTIL;VENTOLIN) 90 MCG/ACT inhaler Inhale 2 puffs into the lungs every 6 hours as needed.           Allergies   Allergen Reactions   ??? Morphine Itching   ??? Talwin [Pentazocine] Other (See Comments)     dizzy       History   Substance Use Topics   ??? Smoking status: Former Smoker -- 0.50 packs/day for 35 years     Types: Cigarettes     Quit date: 08/07/2012   ??? Smokeless tobacco: Never Used   ??? Alcohol Use: No       BP 150/88    Pulse 84    Wt 277 lb (125.646 kg)    SpO2 98%         Review of Systems    Objective:   Physical Exam   Constitutional: She is oriented to person, place, and time. She appears well-developed and well-nourished. No distress.   Cardiovascular: Normal rate, regular rhythm and normal heart sounds.    No murmur heard.  No swelling bilateral lower extremities.  No discoloration.  Bilateral lower extremities warm and well-perfused.   Pulmonary/Chest: Effort normal  and breath sounds normal. She has no wheezes. She has no rales.   Neurological: She is alert and oriented to person, place, and time.   Psychiatric: She has a normal mood and affect. Her behavior is normal.       Assessment:     1. Hypertension    2. Leg pain       Plan:     Monitor home bps.    Follow up with Dr. Mellody Drown for lower leg pain.

## 2014-04-10 NOTE — Progress Notes (Signed)
Patient is here for a follow-up on her hypertension.  Patient saw Dr. Beacher May and she states that she has been feeling better.

## 2014-04-10 NOTE — Care Coordination-Inpatient (Signed)
Called to follow up with patient about current health issues. No answer. Left general message with call back phone number

## 2014-04-17 ENCOUNTER — Encounter

## 2014-04-17 LAB — TSH: TSH: 3.76 u[IU]/mL (ref 0.27–4.20)

## 2014-04-17 LAB — T4: T4, Total: 6.6 ug/dL (ref 4.5–10.9)

## 2014-04-17 MED ORDER — METAXALONE 800 MG PO TABS
800 MG | ORAL_TABLET | Freq: Three times a day (TID) | ORAL | Status: AC
Start: 2014-04-17 — End: 2014-04-27

## 2014-04-17 NOTE — Progress Notes (Signed)
Subjective:      Patient ID: Rachael Mays is a 58 y.o. female.    Abdominal Pain  This is a recurrent problem. The current episode started more than 1 month ago. The onset quality is gradual. The pain is located in the RUQ and right flank. Pertinent negatives include no constipation, diarrhea, dysuria, fever, frequency, hematuria, nausea or vomiting.   No urinary issue. It is a constant ache in RUQ but then it worsens with cough, sneeze, laugh, bending over. No SOB. No recent sicknesses. This has been going on for a year, but worse in the last month or so. When she laughs, she has to hold it.     Review of Systems   Constitutional: Negative for fever, chills and appetite change.   Respiratory: Negative for shortness of breath.    Cardiovascular: Positive for chest pain (right lateral chest wall pain).   Gastrointestinal: Negative for nausea, vomiting, abdominal pain, diarrhea and constipation.   Genitourinary: Negative for dysuria, urgency, frequency, hematuria and difficulty urinating.   Musculoskeletal: Negative for back pain.       Objective:   Physical Exam   Constitutional: She is oriented to person, place, and time. Vital signs are normal. She appears well-developed and well-nourished. She is cooperative.   Cardiovascular: Normal rate, regular rhythm and normal heart sounds.    Pulmonary/Chest: Effort normal and breath sounds normal. No respiratory distress. She has no decreased breath sounds.   Right lateral chest wall tender to palpate   Abdominal: Soft. Normal appearance and bowel sounds are normal. She exhibits no distension and no mass. There is no hepatosplenomegaly. There is no tenderness. There is no rigidity, no rebound, no guarding and no CVA tenderness. No hernia.   Neurological: She is alert and oriented to person, place, and time.   Skin: Skin is warm, dry and intact. No rash noted.   Psychiatric: Her mood appears anxious.       Assessment:      Rachael Mays was seen today for abdominal  pain.    Diagnoses and associated orders for this visit:    Chest wall pain  - metaxalone (SKELAXIN) 800 MG tablet; Take 1 tablet by mouth 3 times daily for 10 days               Plan:      Try a new muscle relaxer. Follow up with pain doctor.

## 2014-04-17 NOTE — Progress Notes (Signed)
I have reviewed the history and physical note and findings.

## 2014-04-17 NOTE — Care Coordination-Inpatient (Signed)
Met briefly with patient in office.  Offered opportunity for shopping excursion to [employer] .she needs to check her schedule and will call [place] if she can come.

## 2014-04-17 NOTE — Patient Instructions (Signed)
Rachael Mays was seen today for abdominal pain.    Diagnoses and associated orders for this visit:    Chest wall pain  - metaxalone (SKELAXIN) 800 MG tablet; Take 1 tablet by mouth 3 times daily for 10 days

## 2014-04-18 ENCOUNTER — Encounter

## 2014-04-18 NOTE — Care Coordination-Inpatient (Signed)
Met briefly with patient in office.  Invited to grocery store tour at [employer]  States she needs to check calendar first but will call [place] to confirm or decline invite.

## 2014-04-20 MED ORDER — SUCRALFATE 1 G PO TABS
1 GM | ORAL_TABLET | ORAL | Status: DC
Start: 2014-04-20 — End: 2014-11-12

## 2014-04-20 NOTE — Progress Notes (Signed)
Subjective:      Patient ID: Rachael Mays is a 58 y.o. female.    HPI  Patient here for follow up.  She saw Dr. Gracy Bruins about two weeks ago for edema in bilateral hands and feet.  She was advised to limit salt in diet.  She has noticed improvement in edema.  No dyspnea.  Labs in 01/2013 included cmp which was normal.      Still with pain in left abdomen which has been ongoing since lap band removal.  Plans to see Dr. Nicole Cella for f/u.    Continues to get erythematous rash under panus, in skin folds.    Patient Active Problem List   Diagnosis   ??? Back Pain   ??? Deep Vein Thrombosis   ??? Depression   ??? High Cholesterol   ??? Hypertension   ??? Pulmonary Embolism   ??? Nausea & vomiting   ??? Abdominal  pain, other specified site   ??? Morbid obesity ([place])   ??? Status post gastric banding   ??? Vertigo   ??? Dizziness   ??? Tinnitus, subjective   ??? Meniere syndrome   ??? Complication of gastric banding   ??? Wound infection ([place])   ??? HTN (hypertension)   ??? Hyperglycemia       Outpatient Prescriptions Marked as Taking for the 03/12/14 encounter (Office Visit) with Harvel Quale, MD   Medication Sig Dispense Refill   ??? nystatin (MYCOSTATIN) 100000 UNIT/GM powder Apply 3 times daily.  3 Bottle  5   ??? [DISCONTINUED] ALPRAZolam (XANAX) 0.5 MG tablet Take 1 tablet by mouth 2 times daily as needed for Anxiety.  60 tablet  0   ??? fluocinonide (LIDEX) 0.05 % cream Apply topically 2 times daily.  1 Tube  1   ??? [DISCONTINUED] cloNIDine (CATAPRES) 0.1 MG/24HR Place 1 patch onto the skin once a week.  4 patch  5   ??? ondansetron (ZOFRAN ODT) 4 MG disintegrating tablet Take 1 tablet by mouth every 8 hours as needed for Nausea or Vomiting.  90 tablet  0   ??? [DISCONTINUED] DULoxetine (CYMBALTA) 60 MG capsule Take 1 capsule by mouth daily.  30 capsule  3   ??? traMADol (ULTRAM) 50 MG tablet Take 2 tablets by mouth 3 times daily as needed for Pain.  60 tablet  0   ??? [DISCONTINUED] montelukast (SINGULAIR) 10 MG tablet Take 1 tablet by mouth daily.  90  tablet  0   ??? lidocaine (LIDODERM) 5 % Place 1 patch onto the skin daily. 12 hours on, 12 hours off.  30 patch  6   ??? acetaminophen 650 MG TABS Take 650 mg by mouth every 4 hours as needed.  120 tablet  3   ??? [DISCONTINUED] sucralfate (CARAFATE) 1 GM tablet Take 1 tablet by mouth every 6 hours.  120 tablet  3   ??? omeprazole (PRILOSEC) 40 MG capsule Take 1 capsule by mouth daily.  30 capsule  3   ??? cetirizine (ZYRTEC ALLERGY) 10 MG tablet Take 10 mg by mouth daily.       ??? albuterol (PROVENTIL;VENTOLIN) 90 MCG/ACT inhaler Inhale 2 puffs into the lungs every 6 hours as needed.           Allergies   Allergen Reactions   ??? Talwin [Pentazocine] Other (See Comments)     dizzy       History   Substance Use Topics   ??? Smoking status: Former Smoker -- 0.50 packs/day  for 35 years     Types: Cigarettes     Quit date: 08/07/2012   ??? Smokeless tobacco: Never Used   ??? Alcohol Use: No       BP 120/80    Pulse 84    Wt 272 lb 3.2 oz (123.469 kg)       Review of Systems    Objective:   Physical Exam   Constitutional: She is oriented to person, place, and time. She appears well-developed and well-nourished. No distress.   Cardiovascular: Normal rate, regular rhythm and normal heart sounds.    No murmur heard.  No appreciable edema bilateral hand and legs   Pulmonary/Chest: Effort normal and breath sounds normal. She has no wheezes. She has no rales.   Neurological: She is alert and oriented to person, place, and time.   Psychiatric: She has a normal mood and affect. Her behavior is normal.       Assessment:     1. Edema     2. Dermatitis  nystatin (MYCOSTATIN) 100000 UNIT/GM powder   3. Chronic pain          Plan:   Edema improved.  Continue to limit salt.   Agree with Dr. Nicole Cella f/u.  Nystatin for dermatitis.

## 2014-05-03 NOTE — ED Provider Notes (Signed)
The history is provided by the patient.          [facility]UR[facility]  Emergency Department Encounter    Pt Name: Rachael Mays  MRN: [id number]  [name] 08/31/1956  Date of evaluation: 05/03/2014  Provider: Rolan Bucco, PA    Chief Complaint   Patient presents with   ??? Chest Pain     Chest pain about an hour ago. Pain radiating down her left arm and neck       Nursing Triage Note, Past Medical Hx, Past Surgical Hx, Social Hx, Allergies, and Family Hx were all reviewed and agreed with or any disagreements were addressed in the HPI.    HPI:  (Location, Duration, Timing, Severity, Quality, Assoc Sx, Context, Modifying factors)  This is a  59 y.o. female presents to the emergency department complaining of left-sided chest pain radiates to the neck and also to the arm.  She describes this as a pressure sensation.  She has, shortness of breath.  No fever, chills or cough.  She had a similar but not as much pressure sensation chest pain month ago and had a CT scan of the chest for possible pulmonary embolus that was negative.  Nothing seems to make the pain better or worse.  It's only been present for an hour prior to her arrival.  She is concerned because this is been at least 2 and possibly 3 years since she's had her last stress testing or catheterization.  She did not take any medicine for this.    Past Medical/Surgical History:      Diagnosis Date   ??? Anxiety    ??? Asthma    ??? Chronic back pain    ??? Deep vein thrombosis    ??? Depression    ??? GERD (gastroesophageal reflux disease)      NO LONGER SINCE LAP   ??? Pulmonary embolism (HCC)    ??? Obesity      hx of; had lap band   ??? Hyperlipidemia      hx; resolved with lap band   ??? GERD (gastroesophageal reflux disease) 01/23/2009   ??? Hypertension          Procedure Laterality Date   ??? Colonoscopy     ??? Upper gastrointestinal endoscopy     ??? Cesarean section     ??? Cholecystectomy     ??? Hysterectomy     ??? Shoulder surgery     ??? Lap band  05/01/08     Dr. Nicole Cella   ??? Back surgery        neck  plates   ??? Colonoscopy  04/08/10   ??? Spine surgery     ??? Other surgical history       [place] Filter: curently in place   ??? Upper gastrointestinal endoscopy  05/19/13   ??? Other surgical history  07/05/13     lap band removal   ??? Upper gastrointestinal endoscopy  07/21/13     ESOPHAGEAL STENT PLACEMENT   ??? Upper gastrointestinal endoscopy  07/21/13     WITH EXCHANGE OF ESOPHAGEAL STENT   ??? Partial hysterectomy         Medications:  Current Discharge Medication List      CONTINUE these medications which have NOT CHANGED    Details   sucralfate (CARAFATE) 1 GM tablet TAKE ONE TABLET BY MOUTH EVERY 6 HOURS  Qty: 120 tablet, Refills: 1      hydrochlorothiazide (HYDRODIURIL) 25 MG tablet Take 1  tablet by mouth daily  Qty: 90 tablet, Refills: 3      lisinopril (PRINIVIL;ZESTRIL) 10 MG tablet Take 1 tablet by mouth daily  Qty: 90 tablet, Refills: 3      escitalopram (LEXAPRO) 20 MG tablet Take 1 tablet by mouth daily Take 0.5 tablets by mouth daily for a week then increase to a full tablet daily on week 2.  Qty: 30 tablet, Refills: 3      ALPRAZolam (XANAX) 0.5 MG tablet Take 1 tablet by mouth 2 times daily as needed for Anxiety  Qty: 60 tablet, Refills: 2      montelukast (SINGULAIR) 10 MG tablet TAKE ONE TABLET BY MOUTH DAILY  Qty: 90 tablet, Refills: 3      nystatin (MYCOSTATIN) 100000 UNIT/GM powder Apply 3 times daily.  Qty: 3 Bottle, Refills: 5    Associated Diagnoses: Dermatitis      ondansetron (ZOFRAN ODT) 4 MG disintegrating tablet Take 1 tablet by mouth every 8 hours as needed for Nausea or Vomiting.  Qty: 90 tablet, Refills: 0    Associated Diagnoses: Nausea      omeprazole (PRILOSEC) 40 MG capsule Take 1 capsule by mouth daily.  Qty: 30 capsule, Refills: 3      cetirizine (ZYRTEC ALLERGY) 10 MG tablet Take 10 mg by mouth daily.      albuterol (PROVENTIL;VENTOLIN) 90 MCG/ACT inhaler Inhale 2 puffs into the lungs every 6 hours as needed.      DULoxetine (CYMBALTA) 30 MG capsule Take 1 capsule by mouth  daily Take 30mg  daily for one week, then discontinue of tolerated.  Qty: 14 capsule, Refills: 0    Associated Diagnoses: Depression; Chronic pain      methylPREDNISolone (MEDROL) 4 MG tablet Take 4 mg by mouth daily      HYDROcodone-acetaminophen (VICODIN) 5-300 MG TABS Take by mouth 3 times daily      fluocinonide (LIDEX) 0.05 % cream Apply topically 2 times daily.  Qty: 1 Tube, Refills: 1    Associated Diagnoses: Eczema      traMADol (ULTRAM) 50 MG tablet Take 2 tablets by mouth 3 times daily as needed for Pain.  Qty: 60 tablet, Refills: 0      lidocaine (LIDODERM) 5 % Place 1 patch onto the skin daily. 12 hours on, 12 hours off.  Qty: 30 patch, Refills: 6      acetaminophen 650 MG TABS Take 650 mg by mouth every 4 hours as needed.  Qty: 120 tablet, Refills: 3               Review of Systems   Constitutional: Negative for fever.   HENT: Negative for sore throat.    Respiratory: Positive for shortness of breath.    Cardiovascular: Positive for chest pain.   Gastrointestinal: Negative for abdominal pain.   Musculoskeletal: Negative for back pain.   Skin: Negative for rash.   Neurological: Negative for dizziness, light-headedness and headaches.   All other systems reviewed and are negative.      Physical Exam   Constitutional: She is cooperative.  Non-toxic appearance.   HENT:   Mouth/Throat: Mucous membranes are normal.   Eyes: Conjunctivae are normal.   Neck: Phonation normal.   Cardiovascular: Normal rate and regular rhythm.    Pulmonary/Chest: Effort normal and breath sounds normal. She exhibits tenderness and bony tenderness.   Abdominal: Soft. Bowel sounds are normal. There is no tenderness. There is no rigidity, no rebound and no guarding.   Neurological: She is  alert.   Skin: Skin is warm and dry.   Psychiatric: She has a normal mood and affect.   Nursing note and vitals reviewed.      Procedures    MDM    MEDICAL DECISION MAKING    Vitals:    Filed Vitals:    05/04/14 0102 05/04/14 0117 05/04/14 0217  05/04/14 0245   BP: 150/67 144/67 141/81 155/79   Pulse: 92 91 83 83   Temp:   97 ??F (36.1 ??C) 97.9 ??F (36.6 ??C)   TempSrc:   Oral Temporal   Resp: 17 22 17 16    Height:    5\' 6"  (1.676 m)   Weight:    274 lb 9.6 oz (124.558 kg)   SpO2: 95% 95% 94%        LABS:   Labs Reviewed   CBC WITH AUTO DIFFERENTIAL - Abnormal; Notable for the following:     RDW 15.8 (*)     All other components within normal limits   POCT VENOUS - Abnormal; Notable for the following:     POC Potassium 3.3 (*)     POC Glucose 125 (*)     POC BUN 22 (*)     POC Creatinine 1.2 (*)     GFR Non-African American 46 (*)     GFR African American 56 (*)     All other components within normal limits   TROPONIN   TROPONIN   TROPONIN   POCT CHEM BASIC W ICA   POCT TROPONIN   POCT VENOUS        Remainder of labs reviewed and were negative at this time or not returned at the time of this note.    Orders:  Orders Placed This Encounter   Procedures   ??? XR Chest Portable   ??? CBC Auto Differential   ??? Troponin   ??? Telemetry monitoring   ??? Inpatient consult to Internal Medicine   ??? Inpatient consult to Cardiology   ??? Oxygen Therapy   ??? Oxygen Therapy   ??? MDI Treatment   ??? POCT chem basic w/ ICA   ??? POCT troponin   ??? POCT Venous   ??? POCT Venous   ??? EKG 12 Lead   ??? ECHO Exercise Stress Test   ??? Saline lock IV   ??? Saline lock IV   ??? Patient status Admit as Inpatient       EKG:   The Ekg interpreted by Dr Ronette Deter  and myself in the absence of a cardiologist shows.  Normal Sinus rhythm   Rate of   89  Axis is   Normal  QTc is  normal  Intervals and Durations are unremarkable.      Nonspecific ST-T wave changes appreciated.  No evidence of acute ischemia.   This was compared to a previous EKG.       X-RAYS:     XR Chest Portable (Final result) Result time: 05/04/14 00:22:55    Final result by Fulton Mole, MD (05/04/14 00:22:55)    Impression:     IMPRESSION:  Mild right lower lobe reticulonodular densities which may represent  bronchitis/bronchiolitis.        Narrative:     EXAMINATION:  SINGLE VIEW OF THE CHEST    05/04/2014 12:06 AM    COMPARISON:  CT PA 03/30/2014. Chest radiograph 03/29/2014.    HISTORY:  chest pain    FINDINGS:  Stable normal mediastinal and cardiac silhouettes. Normal lung volumes  with  no lobar consolidation or effusion. No pneumothorax. There is some mild  right lower lobe reticular nodular densities. Prior anterior cervical fusion              MEDICAL DECISION MAKING / ED COURSE:    Patient was given the following medications:  Medications   albuterol (PROVENTIL HFA;VENTOLIN HFA) 108 (90 BASE) MCG/ACT inhaler 2 puff (not administered)   ALPRAZolam (XANAX) tablet 0.5 mg (not administered)   cetirizine (ZYRTEC) tablet 10 mg (not administered)   escitalopram (LEXAPRO) tablet 20 mg (not administered)   hydrochlorothiazide (HYDRODIURIL) tablet 25 mg (not administered)   lisinopril (PRINIVIL;ZESTRIL) tablet 10 mg (not administered)   montelukast (SINGULAIR) tablet 10 mg (10 mg Oral Not Given 05/04/14 0322)   nystatin (MYCOSTATIN) powder (not administered)   pantoprazole (PROTONIX) tablet 40 mg (not administered)   ondansetron (ZOFRAN-ODT) disintegrating tablet 4 mg (4 mg Oral Given 05/04/14 0322)   sucralfate (CARAFATE) tablet 1 g (not administered)   HYDROmorphone (DILAUDID) injection 0.5 mg (0.5 mg Intravenous Given 05/04/14 0220)   aspirin tablet 325 mg (325 mg Oral Given 05/04/14 0002)   nitroGLYCERIN (NITROSTAT) SL tablet 0.4 mg (0.4 mg Sublingual Given 05/04/14 0039)   ondansetron (ZOFRAN) injection 4 mg (4 mg Intravenous Given 05/04/14 0118)   morphine (PF) injection 2 mg (2 mg Intravenous Given 05/04/14 0118)   diphenhydrAMINE (BENADRYL) injection 25 mg (25 mg Intravenous Given 05/04/14 0132)       We are concerned that the patient's chest pain may be cardiac in origin.  The gallbladder better with the nitroglycerin.  When I gave her morphine she developed hives and itching arm.  Today her 25 Benadryl which did help.  I discussed the case with Dr.  Kennon Holter who is on-call for the patient's family physician and the patient will be admitted for further evaluation and treatment.    The patient tolerated their visit well.  Unless otherwise indicated at the beginning of the chart, they were seen and evaluated by the attending physician who agreed with the assessment and plan.  The patient and / or the family were informed of the results of any tests, a time was given to answer questions, a plan was proposed and they agreed with plan.      CLINICAL IMPRESSION:    1. Chest pain        DISPOSITION Admitted    PATIENT REFERRED TO:  Harvel Quale, [place] [address]  [phone]            DISCHARGE MEDICATIONS:  Current Discharge Medication List          (Please note the MDM and HPI sections of this note were completed with a voice recognition program.  Efforts were made to edit the dictations but occasionally words are mis-transcribed.Rolan Bucco, PA  05/04/14 [phone]

## 2014-05-04 ENCOUNTER — Inpatient Hospital Stay
Admission: EM | Admit: 2014-05-04 | Disposition: A | Discharge status: Home / Self Care | Payer: PRIVATE HEALTH INSURANCE | Source: Home / Self Care | Admitting: Family Medicine

## 2014-05-04 DIAGNOSIS — R079 Chest pain, unspecified: Secondary | ICD-10-CM

## 2014-05-04 LAB — POCT VENOUS
CO2: 25 mEq/L (ref 21–32)
Calcium, Ionized: 1.16 mmol/L (ref 1.12–1.32)
GFR African American: 56 — AB (ref >60)
GFR Non-African American: 46 — AB (ref >60)
POC Anion Gap: 12
POC BUN: 22 mg/dL — ABNORMAL HIGH (ref 7–18)
POC Chloride: 106 mEq/L (ref 99–110)
POC Creatinine: 1.2 mg/dL — ABNORMAL HIGH (ref 0.6–1.1)
POC Glucose: 125 mg/dl — ABNORMAL HIGH (ref 70–99)
POC Potassium: 3.3 mEq/L — ABNORMAL LOW (ref 3.5–5.1)
POC Sodium: 143 mEq/L (ref 136–145)
POC Troponin I: 0 ng/mL (ref 0.00–0.10)

## 2014-05-04 LAB — BASIC METABOLIC PANEL
Anion Gap: 15 (ref 3–16)
BUN: 22 mg/dL — ABNORMAL HIGH (ref 7–20)
CO2: 24 mmol/L (ref 21–32)
Calcium: 9.4 mg/dL (ref 8.3–10.6)
Chloride: 105 mmol/L (ref 99–110)
Creatinine: 1 mg/dL (ref 0.6–1.1)
GFR African American: >60 (ref >60)
GFR Non-African American: 57 — AB (ref >60)
Glucose: 115 mg/dL — ABNORMAL HIGH (ref 70–99)
Potassium: 3.9 mmol/L (ref 3.5–5.1)
Sodium: 144 mmol/L (ref 136–145)

## 2014-05-04 LAB — EKG 12-LEAD
Atrial Rate: 89 {beats}/min
P Axis: 26 degrees
P-R Interval: 146 ms
Q-T Interval: 376 ms
QRS Duration: 78 ms
QTc Calculation (Bazett): 457 ms
R Axis: -12 degrees
T Axis: 65 degrees
Ventricular Rate: 89 {beats}/min

## 2014-05-04 LAB — CBC WITH AUTO DIFFERENTIAL
Basophils %: 0.2 %
Basophils Absolute: 0 10*3/uL (ref 0.0–0.2)
Eosinophils %: 3 %
Eosinophils Absolute: 0.2 10*3/uL (ref 0.0–0.6)
Hematocrit: 41.4 % (ref 36.0–48.0)
Hemoglobin: 13.2 g/dL (ref 12.0–16.0)
Lymphocytes %: 37 %
Lymphocytes Absolute: 2.4 10*3/uL (ref 1.0–5.1)
MCH: 26 pg (ref 26.0–34.0)
MCHC: 31.8 g/dL (ref 31.0–36.0)
MCV: 81.8 fL (ref 80.0–100.0)
MPV: 8.6 fL (ref 5.0–10.5)
Monocytes %: 10.3 %
Monocytes Absolute: 0.7 10*3/uL (ref 0.0–1.3)
Neutrophils %: 49.5 %
Neutrophils Absolute: 3.2 10*3/uL (ref 1.7–7.7)
Platelets: 200 10*3/uL (ref 135–450)
RBC: 5.06 M/uL (ref 4.00–5.20)
RDW: 15.8 % — ABNORMAL HIGH (ref 12.4–15.4)
WBC: 6.5 10*3/uL (ref 4.0–11.0)

## 2014-05-04 LAB — LIPID PANEL
Cholesterol, Total: 208 mg/dL — ABNORMAL HIGH (ref 0–199)
HDL: 49 mg/dL (ref 40–60)
LDL Calculated: 132 mg/dL — ABNORMAL HIGH (ref <100)
Triglycerides: 137 mg/dL (ref 0–150)
VLDL Cholesterol Calculated: 27 mg/dL

## 2014-05-04 LAB — TROPONIN
Troponin: <0.01 ng/mL (ref <0.01)
Troponin: <0.01 ng/mL (ref <0.01)

## 2014-05-04 MED ORDER — METOCLOPRAMIDE HCL 5 MG PO TABS
5 MG | ORAL_TABLET | Freq: Four times a day (QID) | ORAL | Status: DC
Start: 2014-05-04 — End: 2015-01-09

## 2014-05-04 MED ORDER — DIPHENHYDRAMINE HCL 25 MG PO TABS
25 MG | Freq: Four times a day (QID) | ORAL | Status: DC | PRN
Start: 2014-05-04 — End: 2014-07-02

## 2014-05-04 MED ADMIN — sucralfate (CARAFATE) tablet 1 g: 1 g | ORAL | @ 19:00:00 | NDC 68084059301

## 2014-05-04 MED ADMIN — pantoprazole (PROTONIX) tablet 40 mg: 40 mg | ORAL | @ 11:00:00 | NDC 51079005101

## 2014-05-04 MED ADMIN — morphine (PF) injection 2 mg: 2 mg | INTRAVENOUS | @ 05:00:00 | NDC 00409189001

## 2014-05-04 MED ADMIN — sodium chloride flush 0.9 % injection: 10 | @ 19:00:00

## 2014-05-04 MED ADMIN — metoclopramide (REGLAN) tablet 5 mg: 5 mg | ORAL | @ 20:00:00 | NDC 63739048210

## 2014-05-04 MED ADMIN — HYDROmorphone (DILAUDID) injection 0.5 mg: 0.5 mg | INTRAVENOUS | @ 19:00:00 | NDC 00409128305

## 2014-05-04 MED ADMIN — pantoprazole (PROTONIX) tablet 40 mg: 40 mg | ORAL | @ 20:00:00 | NDC 51079005101

## 2014-05-04 MED ADMIN — lisinopril (PRINIVIL;ZESTRIL) tablet 10 mg: 10 mg | ORAL | @ 15:00:00 | NDC 68084019711

## 2014-05-04 MED ADMIN — diphenhydrAMINE (BENADRYL) injection 25 mg: 25 mg | INTRAVENOUS | @ 06:00:00 | NDC 63323066401

## 2014-05-04 MED ADMIN — nitroGLYCERIN (NITROSTAT) SL tablet 0.4 mg: 0.4 mg | SUBLINGUAL | @ 05:00:00 | NDC 00071041813

## 2014-05-04 MED ADMIN — sucralfate (CARAFATE) tablet 1 g: 1 g | ORAL | @ 11:00:00 | NDC 68084059301

## 2014-05-04 MED ADMIN — HYDROmorphone (DILAUDID) injection 0.5 mg: 0.5 mg | INTRAVENOUS | @ 11:00:00 | NDC 00409128305

## 2014-05-04 MED ADMIN — diphenhydrAMINE (BENADRYL) tablet 25 mg: 25 mg | ORAL | @ 11:00:00 | NDC 00904555159

## 2014-05-04 MED ADMIN — ondansetron (ZOFRAN) injection 4 mg: 4 mg | INTRAVENOUS | @ 05:00:00 | NDC 00409475518

## 2014-05-04 MED ADMIN — ondansetron (ZOFRAN-ODT) disintegrating tablet 4 mg: 4 mg | ORAL | @ 07:00:00 | NDC 00781523806

## 2014-05-04 MED ADMIN — aspirin 325 MG tablet: 325 | ORAL | @ 04:00:00 | NDC 57896090101

## 2014-05-04 MED ADMIN — nitroGLYCERIN (NITROSTAT) SL tablet 0.4 mg: 0.4 mg | SUBLINGUAL | @ 04:00:00 | NDC 00071041813

## 2014-05-04 MED ADMIN — escitalopram (LEXAPRO) tablet 20 mg: 20 mg | ORAL | @ 13:00:00 | NDC 68084061711

## 2014-05-04 MED ADMIN — HYDROmorphone (DILAUDID) injection 0.5 mg: 0.5 mg | INTRAVENOUS | @ 15:00:00 | NDC 00409128305

## 2014-05-04 MED ADMIN — nitroGLYCERIN (NITROSTAT) 0.4 MG SL tablet: 0.4 | SUBLINGUAL | @ 04:00:00 | NDC 00071041813

## 2014-05-04 MED ADMIN — hydrochlorothiazide (HYDRODIURIL) tablet 25 mg: 25 mg | ORAL | @ 15:00:00 | NDC 68084008611

## 2014-05-04 MED ADMIN — HYDROmorphone (DILAUDID) 1 MG/ML injection: 0.5 | INTRAVENOUS | @ 06:00:00 | NDC 00409128305

## 2014-05-04 MED ADMIN — metoclopramide (REGLAN) tablet 5 mg: 5 mg | ORAL | @ 14:00:00 | NDC 63739048210

## 2014-05-04 MED ADMIN — cetirizine (ZYRTEC) tablet 10 mg: 10 mg | ORAL | @ 13:00:00 | NDC 51079059701

## 2014-05-04 MED FILL — LEXAPRO 10 MG PO TABS: 10 MG | ORAL | Qty: 2

## 2014-05-04 MED FILL — MYCOSTATIN 100000 UNIT/GM EX POWD: 100000 UNIT/GM | CUTANEOUS | Qty: 15

## 2014-05-04 MED FILL — HYDROMORPHONE HCL PF 1 MG/ML IJ SOLN: 1 MG/ML | INTRAMUSCULAR | Qty: 0.5

## 2014-05-04 MED FILL — ONDANSETRON 4 MG PO TBDP: 4 MG | ORAL | Qty: 1

## 2014-05-04 MED FILL — METOCLOPRAMIDE HCL 10 MG PO TABS: 10 MG | ORAL | Qty: 1

## 2014-05-04 MED FILL — SUCRALFATE 1 G PO TABS: 1 GM | ORAL | Qty: 1

## 2014-05-04 MED FILL — MORPHINE SULFATE (PF) 2 MG/ML IV SOLN: 2 mg/mL | INTRAVENOUS | Qty: 1

## 2014-05-04 MED FILL — NITROSTAT 0.4 MG SL SUBL: 0.4 MG | SUBLINGUAL | Qty: 25

## 2014-05-04 MED FILL — NORMAL SALINE FLUSH 0.9 % IV SOLN: 0.9 % | INTRAVENOUS | Qty: 10

## 2014-05-04 MED FILL — CETIRIZINE HCL 10 MG PO TABS: 10 MG | ORAL | Qty: 1

## 2014-05-04 MED FILL — LISINOPRIL 10 MG PO TABS: 10 MG | ORAL | Qty: 1

## 2014-05-04 MED FILL — PANTOPRAZOLE SODIUM 40 MG PO TBEC: 40 MG | ORAL | Qty: 1

## 2014-05-04 MED FILL — DIPHENHYDRAMINE HCL 25 MG PO TABS: 25 MG | ORAL | Qty: 1

## 2014-05-04 MED FILL — ONDANSETRON HCL 4 MG/2ML IJ SOLN: 4 MG/2ML | INTRAMUSCULAR | Qty: 2

## 2014-05-04 MED FILL — ASPIRIN 325 MG PO TABS: 325 MG | ORAL | Qty: 1

## 2014-05-04 MED FILL — PROAIR HFA 108 (90 BASE) MCG/ACT IN AERS: 108 (90 Base) MCG/ACT | RESPIRATORY_TRACT | Qty: 8.5

## 2014-05-04 MED FILL — HYDROCHLOROTHIAZIDE 25 MG PO TABS: 25 MG | ORAL | Qty: 1

## 2014-05-04 NOTE — Discharge Summary (Signed)
PATIENT NAME:                 PA #:            MR #Rachael Mays, Rachael Mays                 [id number]       [id number]            ATTENDING PHYSICIAN:                  ADM DATE:   DIS DATE:          Kerry Dory, MD                    05/04/2014  05/04/2014         DATE OF BIRTH:   AGE:           PATIENT TYPE:                         December 22, 1955       57             IPF                                      ADMISSION DIAGNOSIS:  Chest pain.     ADDITIONAL DIAGNOSES:  1.  Chronic abdominal pain.  2.  Depression.  3.  Hypertension.  4.  Morbid obesity.    5.  Nausea and vomiting.     CONSULTATIONS:  Dr. Susy Manor.       OPERATIONS AND PROCEDURE:  Stress echocardiogram.       SUMMARY OF HISTORY AND PHYSICAL:  This 58 year old female came to the  emergency room with severe left upper chest pain radiating to the neck and  the arm.  Here in the hospital, her EKG was unremarkable.  She underwent a  stress echocardiogram.  It showed augmentation of her ejection fraction.  She  had no chest wall tenderness noted, just had a lot of GI complaints for the  last year following the erosion of her Lap-Band.  The patient complains of  chronic nausea.  We added Reglan during the hospital course to see if it  could assist with her chronic nausea and vomiting.  She is being discharged  home on her usual medicines with the addition of Reglan 5 mg 4 times a day  prior to meals and bedtime to see if we can help with the nausea.       MEDICATIONS:  She is to continue on her previous medicines of:  1.  Sucralfate.    2.  Hydrochlorothiazide.     3.  Lisinopril.  4.  Lexapro.    5.  Xanax.    6.  Singulair.    7.  Omeprazole.   8.  Zyrtec.  9.  Albuterol.    10.  Vicodin.     She is to follow up with Dr. Murrell Converse in the office here next week.                                 Jamal Collin, MD     [id number]  DD: 05/04/2014 12:46  DT: 05/04/2014 19:04   Job #: [id number]  CC: Kerry Dory, MD  CC: Jamal Collin, MD  CC:  Carma Lair, MD

## 2014-05-04 NOTE — ED Provider Notes (Addendum)
I have personally performed a face to face diagnostic evaluation on this patient. I have fully participated in the care of this patient. I have reviewed and agree with all pertinent clinical information including history, physical exam, diagnostic tests, and the plan.     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 Chest Pain     Chest pain about an hour ago. Pain radiating down her left arm and neck     Review of Systems: See PA note  Vital Signs: BP 127/63    Pulse 89    Temp(Src) 98.9 ??F (37.2 ??C) (Oral)    Resp 20    Ht 5\' 6"  (1.676 m)    Wt 260 lb (117.935 kg)    BMI 41.99 kg/m2      SpO2 94%   Alert 58 y.o. female who does not appear toxic or acutely ill  HENT: Atraumatic, oral mucosa moist  Neck: No JVD  Chest/Lungs: respiratory effort normal clear to auscultation breath sounds equal bilaterally  Cardiovascular regular rate and rhythm without murmur rub or gallop  Neurologic grossly nonfocal Abdomen: Non-distended, soft  Extremities: No edema  RADIOLOGY Read by radiology and directly visualized by me: Xr Chest Portable    05/04/2014   EXAMINATION: SINGLE VIEW  OF THE CHEST  05/04/2014 12:06 AM  COMPARISON: CT PA 03/30/2014.  Chest radiograph 03/29/2014.  HISTORY: chest pain  FINDINGS: Stable normal mediastinal and cardiac silhouettes.  Normal lung volumes with no lobar consolidation or effusion.  No pneumothorax.  There is some mild right lower lobe reticular nodular densities.  Prior anterior cervical fusion     05/04/2014   IMPRESSION: Mild right lower lobe reticulonodular densities which may represent bronchitis/bronchiolitis.   EKG read by myself without a cardiologist reading this time reveals a normal sinus rhythm of 89 bpm voltage criteria for LVH, poor R-wave progression possibly consistent with anterior injury of undetermined age, QT correct interval 457 ms, QRS axis -12??, no semen change compared to a study done 03/29/14  Medical Decision Making and Plan: Chest pain rule out acute coronary syndrome at this point I do not believe her symptoms are secondary to pulmonary embolism, myocarditis, pericarditis, thoracic aortic dissection, I don't see evidence at time of an ST elevation MI but I believes she needs admitted to rule out acute coronary syndrome her risk factors include obesity hypertension and hyperlipidemia, she has risk for pulmonary embolism also had similar symptoms about one month ago and had a  negative CT PE study and negative DVT studies on her legs  Pertinent Labs & Imaging studies reviewed. (See chart for details)  I agree with PA [facility] assessment and plan.          Larena Sox, MD  05/04/14 0107    Larena Sox, MD  05/04/14 8242    Larena Sox, MD  05/04/14 3536    Larena Sox, MD  05/04/14 1443    Larena Sox, MD  05/04/14 [phone]

## 2014-05-04 NOTE — ED Notes (Signed)
Nursing report called to RN on receiving unit. RN accepts patient and has no further questions. Floor verified that pt is visible on tele, NSR.   [facility], RN      [place], [place]  05/04/14 [phone]

## 2014-05-04 NOTE — Progress Notes (Signed)
Pt back from stress/echo. Family at bedside. Will continue to monitor.

## 2014-05-04 NOTE — Progress Notes (Signed)
Department of Family Practice  Adult Daily Progress Note      SUBJECTIVE  58 yo female c/o left upper chest pain like a dull ache that occurred while sitting at the table. Pain radiated to the left neck and left arm, + nausea, no SOB, diaphoresis, came to ER and given morphine with partial relief of pain but complications of itching, she had normal coronary angiogram 2 years ago. Had lap band removed a year ago and has had left abdominal pain and nausea since then.   the chest pain was not changed with deep breath, had normal CTA and doppler of legs a month ago, She has limited ambulation due to weak legs   has depression and HTN, former smoker      OBJECTIVE    Physical  Blood pressure 155/79, pulse 83, temperature 97.9 ??F (36.6 ??C), temperature source Temporal, resp. rate 16, height 5\' 6"  (1.676 m), weight 274 lb 9.6 oz (124.558 kg), SpO2 94 %, not currently breastfeeding.    obese,  alert and in mild distress  clear to auscultation bilaterally  Cor RRR, no chest wall tenderness  soft, non-tender, without masses or organomegaly  Edema: No    Data  CBC:   Lab Results   Component Value Date    WBC 6.5 05/04/2014    RBC 5.06 05/04/2014    HGB 13.2 05/04/2014    HCT 41.4 05/04/2014    MCV 81.8 05/04/2014    MCH 26.0 05/04/2014    MCHC 31.8 05/04/2014    RDW 15.8 05/04/2014    PLT 200 05/04/2014    MPV 8.6 05/04/2014     BMP:    Lab Results   Component Value Date    NA 140 03/29/2014    K 4.3 03/29/2014    CL 103 03/29/2014    CO2 25 05/04/2014    BUN 16 03/29/2014    LABALBU 3.8 02/01/2014    CREATININE 1.2 05/04/2014    CREATININE 0.9 03/29/2014    CALCIUM 9.5 03/29/2014    GFRAA 56 05/04/2014    GFRAA >60 03/16/2012    LABGLOM 46 05/04/2014    GLUCOSE 163 03/29/2014     Last 3 Troponin:    Lab Results   Component Value Date    TROPONINI <0.01 05/04/2014    TROPONINI 0.00 05/04/2014    TROPONINI <0.01 03/29/2014    TROPONINI <0.01 05/15/2011    TROPONINI 0.01 05/15/2011         ASSESSMENT AND PLAN:    Patient Active Problem List   Diagnosis   ???  Back Pain   ??? Deep Vein Thrombosis   ??? Depression   ??? High Cholesterol   ??? Hypertension   ??? Pulmonary Embolism   ??? Nausea & vomiting   ??? Abdominal  pain, other specified site   ??? Morbid obesity ([place])   ??? Status post gastric banding   ??? Vertigo   ??? Dizziness   ??? Tinnitus, subjective   ??? Meniere syndrome   ??? Complication of gastric banding   ??? Wound infection ([place])   ??? HTN (hypertension)   ??? Hyperglycemia   ??? Chest pain       Chest pain, nausea, obesity  Will ask cardiology to see, I doubt that this is coronary in etiology   will add reglan to see if nausea can be improved, benadryl for itching

## 2014-05-04 NOTE — Progress Notes (Signed)
Pt down to stress lab/echo in wheelchair.

## 2014-05-04 NOTE — Progress Notes (Signed)
Pt admitted with Dx of chest pain. A&O x4. Pt ambulated from stretcher to the bed without difficulty, gait steady. Pt stated that she had some relief, but still reports pain at a 7 on 1-10 scale. Pt stated that after receiving Morphine IVP in ER, she started to itch, Benadryl administered. Denied any reaction to Dilaudid. Pt stated that she tripped going up stairs at home, but refused the bed alarm. Call light within reach. Will monitor.

## 2014-05-04 NOTE — Progress Notes (Signed)
MD paged regarding POC, cardiology signed off. Awaiting callback. Will continue to monitor.

## 2014-05-04 NOTE — Telephone Encounter (Signed)
Calling to see what the plan is for pt. Cardiology already signed off & wanting to know if you want to keep pt or d/c?  Please advise

## 2014-05-04 NOTE — Progress Notes (Signed)
Pt assessment completed, see flowsheet. Pt A/O x4, VSS, afebrile. Updated on POC. Denies pain at this time. Pt refusing bed alarm. Pt instructed to call RN with any needs. Call light within reach. Will continue to monitor.

## 2014-05-04 NOTE — Telephone Encounter (Signed)
Hospital call

## 2014-05-04 NOTE — Discharge Instructions (Signed)
Your information:  Name: Rachael Mays  DOB: [birth year]    Your instructions:    metoclopramide  Pronunciation: MET oh Noah Delaine pra mide  Brand: Metozolv ODT, Reglan  What is metoclopramide?  Metoclopramide increases muscle contractions in the upper digestive tract. This speeds up the rate at which the stomach empties into the intestines.  Metoclopramide is used short-term to treat heartburn caused by gastroesophageal reflux in people who have used other medications without relief of symptoms.  Metoclopramide is also used to treat slow gastric emptying in people with diabetes (also called diabetic gastroparesis), which can cause nausea, vomiting, heartburn, loss of appetite, and a feeling of fullness after meals.  Metoclopramide may also be used for purposes not listed in this medication guide.  What should I discuss with my healthcare provider before taking metoclopramide?  You should not take this medication if you are allergic to metoclopramide, or if you have:   bleeding or blockage in your stomach or intestines;   a perforation (hole) in your stomach or intestines;   epilepsy or other seizure disorder; or   an adrenal gland tumor (pheochromocytoma).  To make sure you can safely take metoclopramide, tell your doctor if you have any of these other conditions:   kidney disease;   liver disease (especially cirrhosis);   congestive heart failure, a heart rhythm disorder;   high blood pressure;   breast cancer;   Parkinson's disease;   diabetes (your insulin dose may need adjusting); or   depression or mental illness.  FDA pregnancy category B. This medication is not expected to be harmful to an unborn baby. Tell your doctor if you are pregnant or plan to become pregnant during treatment.  Metoclopramide can pass into breast milk and may harm a nursing baby. Do not use this medication without telling your doctor if you are breast-feeding a baby.  The metoclopramide orally disintegrating tablet (ODT) may  contain phenylalanine. Talk to your doctor before using this form of metoclopramide if you have phenylketonuria (PKU).  Metoclopramide should not be given to a child.  How should I take metoclopramide?  Take exactly as prescribed by your doctor. Metoclopramide is usually taken for only 4 to 12 weeks. Follow the directions on your prescription label.  NEVER TAKE METOCLOPRAMIDE IN LARGER AMOUNTS THAN RECOMMENDED, OR FOR LONGER THAN 12 WEEKS. High doses or long-term use of metoclopramide can cause a serious movement disorder that may not be reversible. Symptoms of this disorder include uncontrollable muscle movements of your lips, tongue, eyes, face, arms, or legs. The longer you take metoclopramide, the more likely you are to develop a serious movement disorder. The risk of this side effect is higher in women, diabetics, and older adults.  Take metoclopramide 30 minutes before eating. Metoclopramide is usually taken before meals and at bedtime. Your doctor may want you to take the medication as needed only with meals that usually cause heartburn. Follow your doctor's instructions.  Measure the liquid medicine with a special dose-measuring spoon or cup, not a regular table spoon. If you do not have a dose-measuring device, ask your pharmacist for one.  To take metoclopramide orally disintegrating tablet (ODT):   Keep the tablet in its bottle or blister pack until you are ready to take the medicine. Make sure your hands are dry before handling a tablet. If the tablet breaks or melts in your hand, throw it away and use a new tablet.   Place the tablet on your tongue. It will begin to  melt right away. Do not swallow the tablet whole. Allow it to melt in your mouth without chewing.   Swallow several times as the tablet melt. You do not need to drink liquid to help the tablet melt.  Do not take two different forms of metoclopramide (such as tablets and oral syrup) at the same time.  Store at room temperature away from  moisture and heat. Keep the bottle tightly closed when not in use.  After you stop taking metoclopramide, you may have unpleasant withdrawal symptoms such as headache, dizziness, or nervousness. Talk to your doctor about how to avoid withdrawal symptoms when stopping the medication.  What happens if I miss a dose?  Take the missed dose as soon as you remember. Skip the missed dose if it is almost time for your next scheduled dose. Do not take extra medicine to make up the missed dose.  What happens if I overdose?  Seek emergency medical attention or call the Poison Help line at [phone]. Overdose symptoms may include drowsiness, confusion, tremors or uncontrolled muscle movements in your face or neck, or seizure (convulsions).  What should I avoid while taking metoclopramide?  Avoid drinking alcohol. It can increase some of the side effects of metoclopramide.  Metoclopramide may impair your thinking or reactions. Be careful if you drive or do anything that requires you to be alert.  What are the possible side effects of metoclopramide?  Get emergency medical help if you have any of these signs of an allergic reaction:  hives; difficulty breathing; swelling of your face, lips, tongue, or throat.  Stop taking metoclopramide and call your doctor at once if you have any of these Browns, which may occur within the first 2 days of treatment:    tremors or shaking in your arms or legs;   uncontrolled muscle movements in your face (chewing, lip smacking, frowning, tongue movement, blinking or eye movement); or   any new or unusual muscle movements you cannot control.  Stop taking metoclopramide and call your doctor at once if you have any of these other serious side effects:    slow or jerky muscle movements, problems with balance or walking;   mask-like appearance in your face;   very stiff (rigid) muscles, high fever, sweating, confusion, fast or uneven heartbeats, tremors,  feeling like you might pass out;   depressed mood, thoughts of suicide or hurting yourself;   hallucinations, anxiety, agitation, jittery feeling, trouble staying still;   swelling, feeling short of breath, rapid weight gain;   jaundice (yellowing of your skin or eyes); or   seizure (convulsions).  Less serious side effects may include:   feeling restless, drowsy, tired, or dizzy;   headache, sleep problems (insomnia);   nausea, vomiting, diarrhea;   breast tenderness or swelling;   changes in your menstrual periods; or   urinating more than usual.  This is not a complete list of side effects and others may occur. Call your doctor for medical advice about side effects. You may report side effects to FDA at 1-800-FDA-1088.  What other drugs will affect metoclopramide?  Before using metoclopramide, tell your doctor if you regularly use other medicines that make you sleepy (such as cold or allergy medicine, sedatives, narcotic pain medicine, sleeping pills, muscle relaxers, and medicine for seizures, depression, or anxiety). They can add to sleepiness caused by metoclopramide.  Tell your doctor about all other medications you use, especially:   acetaminophen (Tylenol);   cyclosporine (  Gengraf, Neoral, Sandimmune);   digoxin (digitalis, Lanoxin);   glycopyrrolate (Robinul);   insulin;   levodopa (Larodopa, Atamet, Parcopa, Sinemet);   mepenzolate (Cantil);   tetracycline (Ala-Tet, Brodspec, Panmycin, Sumycin, Tetracap);   atropine (Donnatal, and others), benztropine (Cogentin), dimenhydrinate (Dramamine), methscopolamine (Pamine), or scopolamine (Transderm-Scop);   bladder or urinary medications such as darifenacin (Enablex), flavoxate (Urispas), oxybutynin (Ditropan, Oxytrol), tolterodine (Detrol), or solifenacin (Vesicare);   blood pressure medications;   bronchodilators such as ipratroprium (Atrovent) or tiotropium (Spiriva);   irritable bowel medications such as dicyclomine (Bentyl),  hyoscyamine (Anaspaz, Cystospaz, Levsin), or propantheline (Pro-Banthine);   an MAO inhibitor such as furazolidone (Furoxone), isocarboxazid (Marplan), phenelzine (Nardil), rasagiline (Azilect), selegiline (Eldepryl, Emsam, Zelapar), or tranylcypromine (Parnate); or   medicines to treat psychiatric disorders, such as chlorpromazine (Thorazine), clozapine (Clozaril, FazaClo), haloperidol (Haldol), olanzapine (Zyprexa, Symbyax), prochlorperazine (Compazine), risperidone (Risperdal), thiothixene (Navane), and others.  This list is not complete and there are many other drugs that can interact with metoclopramide. Tell your doctor about all medications you use. This includes prescription, over-the-counter, vitamin, and herbal products. Do not start a new medication without telling your doctor. Keep a list of all your medicines and show it to any healthcare provider who treats you.  Where can I get more information?  Your pharmacist can provide more information about metoclopramide.    Remember, keep this and all other medicines out of the reach of children, never share your medicines with others, and use this medication only for the indication prescribed.   Every effort has been made to ensure that the information provided by [facility] is accurate, up-to-date, and complete, but no guarantee is made to that effect. Drug information contained herein may be time sensitive. [employer] information has been compiled for use by healthcare practitioners and consumers in the [place] and therefore [employer] does not warrant that uses outside of the [place] are appropriate, unless specifically indicated otherwise. [employer]'s drug information does not endorse drugs, diagnose patients or recommend therapy. [employer]'s drug information is an Scientist, research (physical sciences) to assist licensed healthcare practitioners in caring for their patients and/or to serve consumers viewing this service as a supplement to,  and not a substitute for, the expertise, skill, knowledge and judgment of healthcare practitioners. The absence of a warning for a given drug or drug combination in no way should be construed to indicate that the drug or drug combination is safe, effective or appropriate for any given patient. [employer] does not assume any responsibility for any aspect of healthcare administered with the aid of information [employer] provides. The information contained herein is not intended to cover all possible uses, directions, precautions, warnings, drug interactions, allergic reactions, or adverse effects. If you have questions about the drugs you are taking, check with your doctor, nurse or pharmacist.  Copyright [phone] [employer] [place]: 11.01. Revision date: 01/21/2011.  This information does not replace the advice of a doctor. [employer]orporated disclaims any warranty or liability for your use of this information.   Content Version: 10.4.390249          What to do after you leave the hospital:    Recommended diet: regular diet    Recommended activity: activity as tolerated      The following personal items were collected during your admission and were returned to you:    Valuables  Dentures: None  Vision - Corrective Lenses: Glasses  Hearing Aid: None  Jewelry: Ring, Bracelet (3 of each)  Body Piercings Removed:  No  Clothing: Pants, Shirt, Footwear, Undergarments (Comment)  Were All Patient Medications Collected?: No (at home)  Other Valuables: Purse, Cell phone  Valuables Given To: Other (Comment) (Nothing to send to security)  Provide Name(s) of Who Valuable(s) Were Given To:  (n/a)  Responsible person(s) in the waiting room?:  (n/a)  Patient approves for provider to speak to responsible person post operatively:  (n/a)    Information obtained by:  By signing below, I understand that if any problems occur once I leave the hospital I am to contact ***.  I understand and acknowledge receipt of the instructions  indicated above.

## 2014-05-04 NOTE — Progress Notes (Signed)
Data- discharge order received, pt verbalized agreement to discharge, disposition to previous residence, no needs for HHC/DME.     Action- discharge instructions prepared and given to pt, pt verbalized understanding. Medication information packet given r/t NEW and/or CHANGED prescriptions emphasizing name/purpose/side effects, pt verbalized understanding. Discharge instruction summary: Diet- regular, Activity- as tol, Primary Care Physician as follows: Acquanetta Sit SLOUGH, MD [phone] f/u appointment in one week.    Response- Pt belongings gathered, IV removed. Disposition is home (no HHC/DME needs), transported with all belongings, taken to lobby via w/c, no complications.

## 2014-05-04 NOTE — H&P (Signed)
PATIENT NAME:                 PA #:            MR #CRYSTALLEE, WERDEN                 [id number]       [id number]            ATTENDING PHYSICIAN:                  ADM DATE:   DIS DATE:          Kerry Dory, MD                    05/04/2014                     DATE OF BIRTH:   AGE:           PATIENT TYPE:     RM #:              1955/12/09       58             IPF               [id number]                  CHIEF COMPLAINT:  Chest pain.     HISTORY OF PRESENT ILLNESS:  This 58 year old female came to the emergency  room having pain in the left upper chest.  This pain was described as a dull  ache, severe, radiating to the left neck and the left shoulder and arm.  Did  not change with movement or taking a deep breath.  The patient reports that  she felt nauseated during the spell but did not vomit.  Denied any  diaphoresis or shortness of breath.  She came to the ER because of concerns  about her heart.  The patient had had a normal angiogram 2 years ago.  Had  also had a CTA of the chest performed a month ago to rule out a PE.  She has  also had Dopplers of her legs at that time and has a Greenfield filter.  She  does have a history of having a pulmonary embolism in the past.  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  She reports that  she has persisted with nausea since this episode and persisted with pain in  the left side.       MEDICATIONS:  She has been on multiple medications at home, namely:  1.  Albuterol.  2.  Carafate.  3.  HydroDIURIL 25 mg a day.  4.  Lisinopril 10 a day.  5.  Lexapro 20 a day.  6.  Xanax 0.5 mg twice a day.  7.  Singulair 10 mg a day.  8.  Zofran 4 mg every 8 hours.  9.  Prilosec 40 mg daily.  10.  She is weaned off of her Cymbalta.       She required surgery last year for removal of the gastric band and require  stenting multiple times in order to seal the leak at the  distal esophagus.  She reports that she has nausea frequently, rarely has vomiting.  She reports  that she has not really had exertional chest pain but her ability to ambulate  is very limited.  She states if she walks with her husband less than a block  she has to stop multiple times.       SOCIAL HISTORY:  She is a former smoker.     PAST MEDICAL HISTORY:  Pertinent for the anxiety, the asthma, chronic back  pain.  Had an MRI of her spine performed a few months ago showing some minor  disk disease.  GERD seems to be better since her lap band was removed.  She  had a previous pulmonary embolism, has chronic obesity, chronic  hyperlipidemia, chronic hypertension.     PREVIOUS SURGERIES:  Include a colonoscopy, upper gastrointestinal endoscopy,  a C-section, cholecystectomy, hysterectomy, previous neck surgery.  She has a  Greenfield filter in place, and has had placement of a stent in her esophagus  previously.     FAMILY HISTORY:  The patients family history is pertinent for ovarian cancer  in her mother, heart disease in her mother at 53.  Father had colon cancer.   She has had normal colonoscopies in the past.      REVIEW OF SYSTEMS:  Pertinent for the patient having the chronic abdominal  pain and nausea, has weakness in her legs, difficulty ambulating.  No  exertional chest pain.  She reports no shortness of breath or diaphoresis.   Vision has been normal.  No difficulty swallowing.  Does belch a lot.   Occasionally gets some coughing spells but cannot relate this to any reflux.   She denies any dysuria in passing her urine.  She does have some chronic pain  all over in her back and in her legs.  Ambulation primarily limited due to  her weakness.  Review of systems otherwise unremarkable.     PHYSICAL EXAMINATION:    VITAL SIGNS:  The patient, on physical exam, shows a weight of 274.  Her  blood pressure 141/81, pulse 83, respirations 17.  She is afebrile.   HEENT:  Pupils are equal, round, responsive to  light.  Mouth and pharynx  normal.    NECK:  Without any adenopathy.    LUNGS:  Clear.    HEART:  Normal sinus rhythm.  CHEST:  No chest wall tenderness ascertained.  ABDOMEN:  Multiple healed scars over the abdomen.  There are no abnormal  abnormalities noted of any of these healed scars.  Bowel sounds are active.   Palpation shows no masses, tenderness, or hepatosplenomegaly.  The exam  limited by the morbid obesity.   EXTREMITIES:  Pulses are full in the extremities.  There is no edema present.        LABORATORY STUDIES:  Potassium 3.3.  Hemoglobin 13.2.     Chest x-ray unremarkable, with some mild right lower lobe reticular nodular  densities but this is certainly not ascertaining to her left-sided chest  pain.     Her EKG is unremarkable.     PLAN:  We will obtain Cardiology consultation.  We will try and add some  Reglan for her nausea.  Discussed the benefits and risks of this.  She has  been on everything else for her nausea without any improvement.  Jamal Collin, MD     [phone]  DD: 05/04/2014 08:09   DT: 05/04/2014 09:04   Job #: [id number]  CC: Kerry Dory, MD  CC: Jamal Collin, MD

## 2014-05-04 NOTE — Plan of Care (Signed)
Problem: Pain:  Goal: Control of acute pain  Control of acute pain  Outcome: Ongoing  Pt rates pain on 0-10 scale, medication availble PRN, respirations even and unlabored, will continue to monitor.

## 2014-05-04 NOTE — Consults (Signed)
[facility]  [phone]        Reason for Consultation/Chief Complaint: "I have been having chest pain."    History of Present Illness:  Rachael Mays is a 58 y.o. patient who presented to the hospital with complaints of chest pain. Left side of her chest radiates into her left arm and left side of her neck she had some nausea and shortness of breath with it.  The chest pain has been present since yesterday and still not resolved.  There is no clear exertional pattern.  Her stress test is negative.  She had problems with erosion of her lap band which had to be removed.  Since then she has had problems with some nausea and chest pain.    Past Medical History:   has a past medical history of Anxiety; Asthma; Chronic back pain; Deep vein thrombosis; Depression; GERD (gastroesophageal reflux disease); Pulmonary embolism ([place]); Obesity; Hyperlipidemia; GERD (gastroesophageal reflux disease); and Hypertension.    Surgical History:   has past surgical history that includes Colonoscopy; Upper gastrointestinal endoscopy; Cesarean section; Cholecystectomy; Hysterectomy; shoulder surgery; Lap Band (05/01/08); back surgery; Colonoscopy (04/08/10); Spine surgery; other surgical history; Upper gastrointestinal endoscopy (05/19/13); other surgical history (07/05/13); Upper gastrointestinal endoscopy (07/21/13); Upper gastrointestinal endoscopy (07/21/13); and partial hysterectomy.     Social History:   reports that she quit smoking about 20 months ago. Her smoking use included Cigarettes. She has a 17.5 pack-year smoking history. She has never used smokeless tobacco. She reports that she does not drink alcohol or use illicit drugs.     Family History:  family history includes Arthritis in her mother; Cancer in her mother and mother; Cancer (age of onset: 59) in her father; Depression in her mother; Diabetes in an other family member; Early Death in her paternal grandmother; Heart Disease in her maternal  grandmother and paternal grandfather; Heart Disease (age of onset: 42) in her mother; High Blood Pressure in her mother and another family member; Obesity in an other family member; Ovarian Cancer (age of onset: 49) in her mother.    Home Medications:  Were reviewed and are listed in nursing record. and/or listed below  Prior to Admission medications    Medication Sig Start Date End Date Taking? Authorizing Provider   sucralfate (CARAFATE) 1 GM tablet TAKE ONE TABLET BY MOUTH EVERY 6 HOURS 04/20/14  Yes Darrick Penna, MD   hydrochlorothiazide (HYDRODIURIL) 25 MG tablet Take 1 tablet by mouth daily 04/10/14  Yes Harvel Quale, MD   lisinopril (PRINIVIL;ZESTRIL) 10 MG tablet Take 1 tablet by mouth daily 04/10/14  Yes Harvel Quale, MD   escitalopram (LEXAPRO) 20 MG tablet Take 1 tablet by mouth daily Take 0.5 tablets by mouth daily for a week then increase to a full tablet daily on week 2. 04/02/14  Yes Carla Drape., MD   ALPRAZolam Duanne Moron) 0.5 MG tablet Take 1 tablet by mouth 2 times daily as needed for Anxiety 04/02/14  Yes Carla Drape., MD   montelukast (SINGULAIR) 10 MG tablet TAKE ONE TABLET BY MOUTH DAILY 03/16/14  Yes Jamal Collin, MD   nystatin (MYCOSTATIN) 100000 UNIT/GM powder Apply 3 times daily. 03/12/14  Yes Harvel Quale, MD   ondansetron [name] ODT) 4 MG disintegrating tablet Take 1 tablet by mouth every 8 hours as needed for Nausea or Vomiting. 02/01/14  Yes Harvel Quale, MD   omeprazole (PRILOSEC) 40 MG capsule  Take 1 capsule by mouth daily. 07/21/13  Yes Norwood Levo, MD   cetirizine (ZYRTEC ALLERGY) 10 MG tablet Take 10 mg by mouth daily.   Yes Historical Provider, MD   albuterol (PROVENTIL;VENTOLIN) 90 MCG/ACT inhaler Inhale 2 puffs into the lungs every 6 hours as needed.   Yes Historical Provider, MD   DULoxetine (CYMBALTA) 30 MG capsule Take 1 capsule by mouth daily Take 30mg  daily for one week, then discontinue of tolerated.  04/02/14   Carla Drape., MD   methylPREDNISolone (MEDROL) 4 MG tablet Take 4 mg by mouth daily    Historical Provider, MD   HYDROcodone-acetaminophen (VICODIN) 5-300 MG TABS Take by mouth 3 times daily    Historical Provider, MD   fluocinonide (LIDEX) 0.05 % cream Apply topically 2 times daily. 03/05/14   Harvel Quale, MD   traMADol (ULTRAM) 50 MG tablet Take 2 tablets by mouth 3 times daily as needed for Pain. 01/09/14   Harvel Quale, MD   lidocaine (LIDODERM) 5 % Place 1 patch onto the skin daily. 12 hours on, 12 hours off. 11/10/13   Darrick Penna, MD   acetaminophen 650 MG TABS Take 650 mg by mouth every 4 hours as needed. 08/20/13   Jamal Collin, MD        Allergies:  Morphine and Talwin     Review of Systems:   All 12 point review of symptoms completed. Pertinent positives identified in the HPI, all other review of symptoms negative.    ??     Physical Examination:    Filed Vitals:    05/04/14 0837   BP:    Pulse:    Temp:    Resp: 16    Weight: 274 lb 9.6 oz (124.558 kg)         General Appearance:  Alert, cooperative, no distress, appears stated age   Head:  Normocephalic, without obvious abnormality, atraumatic   Eyes:  EOMI, conjunctiva/corneas clear       Nose: Nares normal   Throat: Lips normal   Neck: Supple, symmetrical, trachea midline,  no carotid bruit or JVD       Lungs:   Clear to auscultation bilaterally, respirations unlabored   Chest Wall:  No tenderness or deformity   Heart:  Regular rate and rhythm, S1, S2 normal, no murmur, rub or gallop   Abdomen:   Soft, non-tender, bowel sounds active all four quadrants,  no masses, no organomegaly           Extremities: Extremities normal, atraumatic, no cyanosis or edema   Pulses: 2+ and symmetric   Skin: Skin color, texture, turgor normal, no rashes or lesions   Pysch: Normal mood and affect   Neurologic: Normal gross motor and sensory exam.         Labs  CBC:   Lab Results   Component Value Date    WBC 6.5  05/04/2014    RBC 5.06 05/04/2014    HGB 13.2 05/04/2014    HCT 41.4 05/04/2014    MCV 81.8 05/04/2014    RDW 15.8 05/04/2014    PLT 200 05/04/2014     CMP:    Lab Results   Component Value Date    NA 144 05/04/2014    K 3.9 05/04/2014    CL 105 05/04/2014    CO2 24 05/04/2014    BUN 22 05/04/2014    CREATININE 1.0 05/04/2014    CREATININE 1.2 05/04/2014  GFRAA >60 05/04/2014    GFRAA >60 03/16/2012    AGRATIO 1.1 02/01/2014    LABGLOM 57 05/04/2014    GLUCOSE 115 05/04/2014    PROT 7.4 02/01/2014    PROT 7.3 03/31/2011    CALCIUM 9.4 05/04/2014    BILITOT 0.4 02/01/2014    ALKPHOS 124 02/01/2014    AST 31 02/01/2014    ALT 39 02/01/2014     PT/INR:    No results found for this basename: PTPATIENT, PTINR     Lab Results   Component Value Date    TROPONINI <0.01 05/04/2014       EKG:  I have reviewed EKG with the following interpretation:  Impression:    03-May-2014 23:22:37 [facility]  Normal sinus rhythm  Moderate voltage criteria for LVH, may be normal variant    Exercise stress echo 05/04/14:  Summary  No EKG evidence for ischemia with exercise  No echocardiographic evidence for ischemia with exercise    Echo 05/04/14:  Summary  Normal left ventricle size, wall thickness and systolic function with an EF of 55% .  There is mild tricuspid regurgitation with RVSP estimated at 28 mmHg.      Event monitor 12/29/12:  1 episode of 15 beats of atrial tachycardia otherwise unremarkable    Cardiac cath June 03, 2012:  Normal coronaries and LVEF  Plan:  1. Continue Holter monitor    Myoview stress test 05/15/11:  IMPRESSION-       Normal myocardial perfusion scan.       Assessment  Patient Active Problem List   Diagnosis   ??? Back Pain   ??? Deep Vein Thrombosis   ??? Depression   ??? High Cholesterol   ??? Hypertension   ??? Pulmonary Embolism   ??? Nausea & vomiting   ??? Abdominal  pain, other specified site   ??? Morbid obesity ([place])   ??? Status post gastric banding   ??? Vertigo   ??? Dizziness   ??? Tinnitus, subjective   ??? Meniere syndrome   ??? Complication of gastric  banding   ??? Wound infection ([place])   ??? HTN (hypertension)   ??? Hyperglycemia   ??? Chest pain         Plan:    I had the opportunity to review the clinical symptoms and presentation of Rachael Mays.  Her chest pain sounds noncardiac in nature.  She has a negative ischemic workup.    I recommend that the patient undergo no further cardiac workup.  She can be discharged home from a cardiac standpoint.  Suggest GI evaluation on an inpatient or outpatient basis.    Thank you for allowing [place] to participate in the care of Rachael Mays. Further evaluation will be based upon the patient's clinical course and testing results.    All questions and concerns were addressed to the patient/family. Alternatives to my treatment were discussed. The note was completed using EMR. Every effort was made to ensure accuracy; however, inadvertent computerized transcription errors may be present.

## 2014-05-07 MED ORDER — ESCITALOPRAM OXALATE 20 MG PO TABS
20 MG | ORAL_TABLET | Freq: Every day | ORAL | Status: DC
Start: 2014-05-07 — End: 2014-07-16

## 2014-05-07 NOTE — Progress Notes (Signed)
Psychiatry Consultation  Loralie Champagne., M.D.  05/07/2014  10:43 AM      Time spent with Patient: 30 min    Controlled Substances Monitoring: Attestation: The Prescription Monitoring Report for this patient was reviewed today. Nori Riis Delsa Grana., MD)  Documentation: No signs of potential drug abuse or diversion identified. Nori Riis Delsa Grana., MD)    Reason for Consult:  depression  Referring Provider: Harvel Quale, [place] [address]    Recommendations:    ?? Continue current medications and follow    Diagnosis:    Axis I  Major depressive disorder; recurrent    Axis II  Coping Skills:  good    Axis III       Diagnosis Date   ??? Anxiety    ??? Asthma    ??? Chronic back pain    ??? Deep vein thrombosis    ??? Depression    ??? GERD (gastroesophageal reflux disease)      NO LONGER SINCE LAP   ??? Pulmonary embolism (HCC)    ??? Obesity      hx of; had lap band   ??? Hyperlipidemia      hx; resolved with lap band   ??? GERD (gastroesophageal reflux disease) 01/23/2009   ??? Hypertension         Axis IV  Other psychosocial and environmental problems    Axis V  61-70 mild symptoms    Examination:      Vital Signs BP 142/92    Pulse 78    Wt 279 lb (126.554 kg)     Appearance    alert, cooperative, no distress  Speech    spontaneous, normal rate, normal volume and well articulated  Mood    euthymic  Affect    normal affect  Thought Content    intact  Thought Process    linear, goal directed and coherent  Associations    logical connections  Insight    Good  Judgment    Intact  No abnormal movements, tics or mannerisms.    Orientation    oriented to person, place, time, and general circumstances  Memory    recent and remote memory intact  Attention/Concentration    intact  Language    0 - no aphasia, normal  Fund of Knowledge    intact  Suicide Assessment    no suicidal ideation    Clinical Interview: Ms. Scullin returned noting significant improvement.  Her irritability is much less and  she feels she handles stress much better with the current medications.  She has felt more energy, good interest, and good motivation.  She is tolerating the medication well without side effects.  We discussed her situation at length including some issues that she is dealing with regarding a child she has been raising for her daughter's former same-sex partner, and that partner's relapse on drugs after three years.  She is trying to obtain custody because she fears for the child's well-being if he is given back to his mother, and she has been stealing from the home and even stole her son's Adderall to sell it for other drugs.  Ms. Kirshner wants to continue to raise the child because she is the primary attachment figure that he has right now.    HPI:     Location: brain    Duration: chronic    Timing: continuous    Severity: mild  Quality: good improvement of symptoms    Assoc Sx: none    Context:good response t otreatment    Modifying factors: trouble with social/family situation      History:      Past Medical History:    Past Medical History   Diagnosis Date   ??? Anxiety    ??? Asthma    ??? Chronic back pain    ??? Deep vein thrombosis    ??? Depression    ??? GERD (gastroesophageal reflux disease)      NO LONGER SINCE LAP   ??? Pulmonary embolism (HCC)    ??? Obesity      hx of; had lap band   ??? Hyperlipidemia      hx; resolved with lap band   ??? GERD (gastroesophageal reflux disease) 01/23/2009   ??? Hypertension        Past Surgical History:    Past Surgical History   Procedure Laterality Date   ??? Colonoscopy     ??? Upper gastrointestinal endoscopy     ??? Cesarean section     ??? Cholecystectomy     ??? Hysterectomy     ??? Shoulder surgery     ??? Lap band  05/01/08     Dr. Nicole Cella   ??? Back surgery       neck  plates   ??? Colonoscopy  04/08/10   ??? Spine surgery     ??? Other surgical history       [place] Filter: curently in place   ??? Upper gastrointestinal endoscopy  05/19/13   ??? Other surgical history  07/05/13     lap band removal   ???  Upper gastrointestinal endoscopy  07/21/13     ESOPHAGEAL STENT PLACEMENT   ??? Upper gastrointestinal endoscopy  07/21/13     WITH EXCHANGE OF ESOPHAGEAL STENT   ??? Partial hysterectomy         Medications:   Current Outpatient Prescriptions   Medication Sig Dispense Refill   ??? diphenhydrAMINE (BENADRYL) 25 MG tablet Take 1 tablet by mouth every 6 hours as needed for Itching       ??? metoclopramide (REGLAN) 5 MG tablet Take 1 tablet by mouth 4 times daily (before meals and nightly)  120 tablet  3   ??? sucralfate (CARAFATE) 1 GM tablet TAKE ONE TABLET BY MOUTH EVERY 6 HOURS  120 tablet  1   ??? hydrochlorothiazide (HYDRODIURIL) 25 MG tablet Take 1 tablet by mouth daily  90 tablet  3   ??? lisinopril (PRINIVIL;ZESTRIL) 10 MG tablet Take 1 tablet by mouth daily  90 tablet  3   ??? escitalopram (LEXAPRO) 20 MG tablet Take 1 tablet by mouth daily Take 0.5 tablets by mouth daily for a week then increase to a full tablet daily on week 2.  30 tablet  3   ??? ALPRAZolam (XANAX) 0.5 MG tablet Take 1 tablet by mouth 2 times daily as needed for Anxiety  60 tablet  2   ??? HYDROcodone-acetaminophen (VICODIN) 5-300 MG TABS Take by mouth 3 times daily       ??? montelukast (SINGULAIR) 10 MG tablet TAKE ONE TABLET BY MOUTH DAILY  90 tablet  3   ??? nystatin (MYCOSTATIN) 100000 UNIT/GM powder Apply 3 times daily.  3 Bottle  5   ??? fluocinonide (LIDEX) 0.05 % cream Apply topically 2 times daily.  1 Tube  1   ??? ondansetron (ZOFRAN ODT) 4 MG disintegrating tablet Take 1 tablet by mouth  every 8 hours as needed for Nausea or Vomiting.  90 tablet  0   ??? traMADol (ULTRAM) 50 MG tablet Take 2 tablets by mouth 3 times daily as needed for Pain.  60 tablet  0   ??? lidocaine (LIDODERM) 5 % Place 1 patch onto the skin daily. 12 hours on, 12 hours off.  30 patch  6   ??? acetaminophen 650 MG TABS Take 650 mg by mouth every 4 hours as needed.  120 tablet  3   ??? omeprazole (PRILOSEC) 40 MG capsule Take 1 capsule by mouth daily.  30 capsule  3   ??? cetirizine (ZYRTEC  ALLERGY) 10 MG tablet Take 10 mg by mouth daily.       ??? albuterol (PROVENTIL;VENTOLIN) 90 MCG/ACT inhaler Inhale 2 puffs into the lungs every 6 hours as needed.         No current facility-administered medications for this visit.       Allergies:  Morphine and Talwin    Social History:   History     Social History   ??? Marital Status: Married     Spouse Name: N/A     Number of Children: N/A   ??? Years of Education: N/A     Occupational History   ??? Not on file.     Social History Main Topics   ??? Smoking status: Former Smoker -- 0.50 packs/day for 35 years     Types: Cigarettes     Quit date: 08/07/2012   ??? Smokeless tobacco: Never Used   ??? Alcohol Use: No   ??? Drug Use: No   ??? Sexual Activity: Not on file     Other Topics Concern   ??? Not on file     Social History Narrative    ** Merged History Encounter **            TOBACCO:   reports that she quit smoking about 20 months ago. Her smoking use included Cigarettes. She has a 17.5 pack-year smoking history. She has never used smokeless tobacco.  ETOH:   reports that she does not drink alcohol.    Family History:   Family History   Problem Relation Age of Onset   ??? Arthritis Mother    ??? Cancer Mother    ??? Depression Mother    ??? High Blood Pressure Mother    ??? Heart Disease Maternal Grandmother    ??? Early Death Paternal Grandmother    ??? Heart Disease Paternal Grandfather    ??? Diabetes Other    ??? High Blood Pressure Other    ??? Obesity Other    ??? Heart Disease Mother 68     MI    ??? Ovarian Cancer Mother 5   ??? Cancer Father 72     colon   ??? Cancer Mother

## 2014-05-07 NOTE — Care Coordination-Inpatient (Signed)
Called to follow up with patient about current health issues. No answer. Left general message with call back phone number

## 2014-05-08 NOTE — Care Coordination-Inpatient (Signed)
[facility] IP Discharge Follow up Call    Date of discharge: 5/31  Facility: [facility]  Non-face-to-face services provided:  Obtained and reviewed discharge summary and/or continuity of care documents    Reason for Hospital Visit:  cp  Discharged with [place]?:  No    Date of first visit after discharge:    Status:     improved    Did you receive a discharge summary with list of medication from the hospital? Yes  Review of Instructions:     Understands what to report/when to return?:  Yes   Understands discharge instructions?:  Yes   Following discharge instructions?:  Yes   If not why?   Is there any lingering symptoms? Yes  Still with abdominal pain  Are you eating and drinking OK? Yes  Any other problems i.e. Constipation, other symptoms?  Some diarrhea  Are there any new complaints of pain? No  If you have a wound is the dressing clean, dry, and intact? N/A  Understands wound care regimen? N/A  Are there any other complaints/concerns that you wish to tell your provider?      FU appts/Provider:    Future Appointments  Date Time Provider Malcolm   07/02/2014 11:30 AM Carla Drape., MD [facility]       New Medications at discharge?:     Yes                      Medication Reconciliation by phone -     Each medication was reviewed (both pre and post hospitalization)  Yes    Were there discrepancies in medications?  No  If YES, were discrepancies addressed? Not Applicable    Understands Medications?  Yes   Questions about medications from pt or caregiver?   Not Applicable   Questions addressed? Not Applicable                Has all of medication and/or prescriptions   Yes  Taking Medications? Yes  Can you swallow your pills?  Yes    Is the patient having any trouble with ADL's or IADL's? No  Is the patient able to move around as expected? Yes  Needs Equipment?  No    Link to services in community?:  N/A   Which services:    Transferred to staff to make f/u  appt.

## 2014-05-08 NOTE — Telephone Encounter (Signed)
-----   Message from Melissa Noon sent at 05/05/2014  8:37 AM EDT -----  Contact: discharge      ----- Message -----     From: Jaci Standard     Sent: 05/05/2014   8:27 AM       To: [facility] Staff    The following patients have been discharged from a [facility] within the past 24-48 hours.  Please discuss a follow-up plan with each patient???s PCP and document in a telephone note using the smartphrase .[email or web address]. (May need to share it with yourself from the [place]). Reason for call is "hospital follow up"    If you have a Care Coordinator- please review the list of patients with your Care Coordinator to ensure no duplication occurs. Thank you  [facility] [place] SHAKISHA, ABEND [id number] June 07, 1956 Inpatient - [place] Discharged Emergency May 04 2014  2:36AM May 04 2014  6:10PM Chest pain

## 2014-05-08 NOTE — Telephone Encounter (Signed)
lmm

## 2014-05-09 NOTE — Progress Notes (Signed)
Rachael Mays is well known to [place].  She had a band and band erosion with removal.  She has done well with surgery.  She has some pain on her left side if she lays on it.  She was recently admitted for admitted for chest pain and pain in left arm.  She was worked up for cardiac issues.  She has regained some of her weight back and had been down over 70lbs.    She denies emesis but has occassional nausea.      ON exam, her abd is benign. Soft and NT      I think it is unlikely that her pain is from her preious band removal.    Will go ahead and get UGI and Ct scan to eval her pain.    I spent a total of 20 minutes with the a patient and  Greater than 50% was face to face counseling time with the patient to discuss the issues covered in this note.

## 2014-05-15 NOTE — Care Coordination-Inpatient (Signed)
Called for hospital f/u.  Daughter answers phone and reports there was a death in the family.  Offered condolences and advised RNCC would call another time.

## 2014-05-17 MED ORDER — ALBUTEROL SULFATE HFA 108 (90 BASE) MCG/ACT IN AERS
108 (90 Base) MCG/ACT | Freq: Four times a day (QID) | RESPIRATORY_TRACT | Status: DC | PRN
Start: 2014-05-17 — End: 2015-12-19

## 2014-05-17 NOTE — Progress Notes (Signed)
Subjective:      Patient ID: Rachael Mays is a 58 y.o. female.    HPI     Patient presents for hospital follow up after admission for chest pain.  She was seen by cardiology and stress echocardiogram was ordered and normal.  She is following with Dr. Nicole Cella and UGI and CT abdomen/pelvis planned.  She thinks that she is going to have and EGD also.      Chest pain resolved.    Patient Active Problem List   Diagnosis   ??? Back Pain   ??? Deep Vein Thrombosis   ??? Depression   ??? High Cholesterol   ??? Hypertension   ??? Pulmonary Embolism   ??? Nausea & vomiting   ??? Abdominal  pain, other specified site   ??? Morbid obesity ([place])   ??? Status post gastric banding   ??? Vertigo   ??? Dizziness   ??? Tinnitus, subjective   ??? Meniere syndrome   ??? Complication of gastric banding   ??? Wound infection ([place])   ??? HTN (hypertension)   ??? Hyperglycemia   ??? Chest pain       Outpatient Prescriptions Marked as Taking for the 05/17/14 encounter (Office Visit) with Harvel Quale, MD   Medication Sig Dispense Refill   ??? Multiple Vitamins-Minerals (MULTIVITAMIN PO) Take by mouth       ??? CALCIUM-VITAMIN D PO Take by mouth       ??? albuterol (PROVENTIL HFA) 108 (90 BASE) MCG/ACT inhaler Inhale 2 puffs into the lungs every 6 hours as needed for Wheezing  3 Inhaler  3   ??? escitalopram (LEXAPRO) 20 MG tablet Take 1 tablet by mouth daily Take 0.5 tablets by mouth daily for a week then increase to a full tablet daily on week 2. (Patient taking differently:   Take 20 mg by mouth daily )  30 tablet  6   ??? diphenhydrAMINE (BENADRYL) 25 MG tablet Take 1 tablet by mouth every 6 hours as needed for Itching       ??? metoclopramide (REGLAN) 5 MG tablet Take 1 tablet by mouth 4 times daily (before meals and nightly)  120 tablet  3   ??? sucralfate (CARAFATE) 1 GM tablet TAKE ONE TABLET BY MOUTH EVERY 6 HOURS  120 tablet  1   ??? hydrochlorothiazide (HYDRODIURIL) 25 MG tablet Take 1 tablet by mouth daily  90 tablet  3   ??? lisinopril (PRINIVIL;ZESTRIL) 10 MG tablet Take  1 tablet by mouth daily  90 tablet  3   ??? ALPRAZolam (XANAX) 0.5 MG tablet Take 1 tablet by mouth 2 times daily as needed for Anxiety  60 tablet  2   ??? montelukast (SINGULAIR) 10 MG tablet TAKE ONE TABLET BY MOUTH DAILY  90 tablet  3   ??? nystatin (MYCOSTATIN) 100000 UNIT/GM powder Apply 3 times daily.  3 Bottle  5   ??? fluocinonide (LIDEX) 0.05 % cream Apply topically 2 times daily.  1 Tube  1   ??? acetaminophen 650 MG TABS Take 650 mg by mouth every 4 hours as needed.  120 tablet  3   ??? omeprazole (PRILOSEC) 40 MG capsule Take 1 capsule by mouth daily.  30 capsule  3   ??? cetirizine (ZYRTEC ALLERGY) 10 MG tablet Take 10 mg by mouth daily.           Allergies   Allergen Reactions   ??? Morphine Itching   ??? Talwin [Pentazocine] Other (See Comments)  dizzy       History   Substance Use Topics   ??? Smoking status: Former Smoker -- 0.50 packs/day for 35 years     Types: Cigarettes     Quit date: 08/07/2012   ??? Smokeless tobacco: Never Used   ??? Alcohol Use: No       BP 130/80    Pulse 78    Wt 280 lb (127.007 kg)    SpO2 97%         Review of Systems    Objective:   Physical Exam   Constitutional: She is oriented to person, place, and time. She appears well-developed and well-nourished. No distress.   Cardiovascular: Normal rate, regular rhythm and normal heart sounds.    No murmur heard.  Pulmonary/Chest: Effort normal and breath sounds normal. She has no wheezes. She has no rales.   Neurological: She is alert and oriented to person, place, and time.   Psychiatric: She has a normal mood and affect. Her behavior is normal.       Assessment:     1. Chest pain       Plan:   CP improved.  Plans further testing through Dr. Nicole Cella for left abdominal pain including UGI and CT by notes.  Patient believes that she is also to have an EGD, so will check with Dr. Antonieta Pert office.

## 2014-05-17 NOTE — Progress Notes (Deleted)
Subjective:      Patient ID: Rachael Mays is a 58 y.o. female.    HPI  Patient is here for a hospital follow up from [facility].  Review of Systems    Objective:   Physical Exam    Assessment:      ***      Plan:      ***

## 2014-05-18 NOTE — Telephone Encounter (Signed)
FYI

## 2014-05-18 NOTE — Telephone Encounter (Signed)
All appts with Janey Genta are made and EGD is sched for 7/17 @ 12:30    Please call if you have any questions

## 2014-05-24 NOTE — Care Coordination-Inpatient (Signed)
[facility] IP Discharge Follow up Call - Subsequent    Reason for Hospital/ED visit:  chest pain  Discharged with Home Health?:  N/A      Status:     not changed    Review of Instructions:                                  Understands what to report/when to return?:  Yes              Following discharge instructions?:  Yes   If not why?     Is there any lingering symptoms? Yes continues with ABD pain  Are you eating and drinking OK? Yes  Any other problems i.e. Constipation, other symptoms?  No  Are there any new complaints of pain? No    Understands wound care regimen and has supplies/equipment? N/A  Are there any other complaints/concerns that you wish to tell your provider?      [facility] appts/Provider:    Future Appointments  Date Time Provider [place]   05/28/2014 10:00 AM [facility] Rm 3 [facility] [place] [facility]   05/28/2014 11:30 AM [facility] Ct Rm 1 [facility]   07/02/2014 11:30 AM Carla Drape., MD [facility]   07/02/2014 3:45 PM Alwyn Ren, MD [facility]         Medication Reconciliation by phone - Yes  Understands Medications?  Yes  Taking Medications? Yes   If not taking medications why?   Can you swallow your pills?  Yes    Is the patient having any trouble with ADL's or IADL's? No  Needs Equipment?  No    Link to services in community?:  N/A              Which services:

## 2014-05-25 LAB — POC URINE WITH MICROSCOPIC
Bilirubin Urine: 1 mg/dL
Blood, Urine: POSITIVE
Casts Urine, POC: NEGATIVE
Epi Cells Urine, POC: NEGATIVE
Glucose, Ur: NEGATIVE
Ketones, Urine: NEGATIVE
Nitrite, Urine: POSITIVE
RBC Urine, POC: NEGATIVE
Specific Gravity, UA: 1.03 (ref 1.005–1.030)
Urobilinogen, Urine: NORMAL
crystals urine, poc: NEGATIVE
pH, UA: 5.5 (ref 4.5–8.0)
yeast urine, poc: NEGATIVE

## 2014-05-25 MED ORDER — CIPROFLOXACIN HCL 500 MG PO TABS
500 MG | ORAL_TABLET | Freq: Two times a day (BID) | ORAL | Status: AC
Start: 2014-05-25 — End: 2014-06-04

## 2014-05-25 MED ORDER — FLUCONAZOLE 150 MG PO TABS
150 MG | ORAL_TABLET | Freq: Once | ORAL | Status: AC
Start: 2014-05-25 — End: 2014-05-25

## 2014-05-25 NOTE — Progress Notes (Signed)
Subjective:      Patient ID: Rachael Mays is a 58 y.o. female.    Urinary Tract Infection   This is a new problem. Episode onset: 2 days ago. The problem occurs every urination. The problem has been unchanged. The quality of the pain is described as burning. The pain is at a severity of 5/10. The pain is moderate. There has been no fever. She is not sexually active. There is no history of pyelonephritis. Associated symptoms include flank pain and frequency. Pertinent negatives include no chills, hematuria, nausea, urgency or vomiting. Associated symptoms comments: Bad odor. She has tried nothing for the symptoms.       Review of Systems   Constitutional: Negative for fever, chills and appetite change.   Respiratory: Negative for shortness of breath.    Cardiovascular: Negative for chest pain.   Gastrointestinal: Positive for abdominal pain. Negative for nausea, vomiting, diarrhea and constipation.   Genitourinary: Positive for dysuria, frequency and flank pain. Negative for urgency, hematuria and difficulty urinating.   Musculoskeletal: Negative for back pain.       Objective:   Physical Exam   Constitutional: She is oriented to person, place, and time. Vital signs are normal. She appears well-developed and well-nourished. She is cooperative.   Cardiovascular: Normal rate, regular rhythm and normal heart sounds.    Pulmonary/Chest: Effort normal and breath sounds normal. No respiratory distress. She has no decreased breath sounds.   Abdominal: Soft. Normal appearance and bowel sounds are normal. She exhibits no distension and no mass. There is no hepatosplenomegaly. There is no tenderness. There is no rigidity, no rebound, no guarding and no CVA tenderness.   Neurological: She is alert and oriented to person, place, and time.       Assessment:      Rachael Mays was seen today for urinary tract infection.    Diagnoses and associated orders for this visit:    UTI (lower urinary tract infection)  - POC URINE with  Microscopic  - ciprofloxacin (CIPRO) 500 MG tablet; Take 1 tablet by mouth 2 times daily for 10 days    Other Orders  - fluconazole (DIFLUCAN) 150 MG tablet; Take 1 tablet by mouth once for 1 dose               Plan:      Push fluids, call if symptoms persist after treatment.

## 2014-05-25 NOTE — Progress Notes (Signed)
I have reviewed the history and physical note and findings.

## 2014-05-25 NOTE — Patient Instructions (Signed)
Rachael Mays was seen today for urinary tract infection.    Diagnoses and associated orders for this visit:    UTI (lower urinary tract infection)  - POC URINE with Microscopic  - ciprofloxacin (CIPRO) 500 MG tablet; Take 1 tablet by mouth 2 times daily for 10 days    Other Orders  - fluconazole (DIFLUCAN) 150 MG tablet; Take 1 tablet by mouth once for 1 dose         Push fluids, call if symptoms persist after treatment.

## 2014-05-28 ENCOUNTER — Encounter

## 2014-05-28 MED ADMIN — iopamidol (ISOVUE-370) 76 % injection 100 mL: 100 mL | INTRAVENOUS | @ 16:00:00 | NDC 00270131635

## 2014-05-28 MED ADMIN — sodium chloride (PF) 0.9 % injection 10 mL: 10 mL | INTRAVENOUS | @ 16:00:00

## 2014-05-28 MED FILL — ISOVUE-370 76 % IV SOLN: 76 % | INTRAVENOUS | Qty: 100

## 2014-05-28 MED FILL — OMNIPAQUE 240 MG/ML IJ SOLN: 240 MG/ML | INTRAMUSCULAR | Qty: 50

## 2014-05-28 MED FILL — NORMAL SALINE FLUSH 0.9 % IV SOLN: 0.9 % | INTRAVENOUS | Qty: 50

## 2014-05-29 ENCOUNTER — Encounter

## 2014-05-30 NOTE — Care Coordination-Inpatient (Signed)
Called to follow up with patient about current health issues. No answer. Left general message with call back phone number

## 2014-06-05 NOTE — Care Coordination-Inpatient (Signed)
Called to follow up with patient about current health issues. No answer. Left general message with call back phone number

## 2014-06-11 MED ORDER — METAXALONE 800 MG PO TABS
800 MG | ORAL_TABLET | ORAL | Status: DC
Start: 2014-06-11 — End: 2014-08-21

## 2014-06-13 NOTE — Telephone Encounter (Signed)
LM for pt to r/s EGD from 06/22/14 to 07/25/14. Stated to call and confirm. Also sent email

## 2014-06-28 NOTE — Telephone Encounter (Signed)
Spoke with patient about UGI and CT results, will discuss further with Dr. Nicole Cella on Monday

## 2014-06-28 NOTE — Telephone Encounter (Signed)
Pt calling wanting to know her results from Upper GI and CT scan. Pt has appt on Monday but rather get results before that so she doesn't have to come in on Monday. Had another appt that day with other provider. Plz advise [phone]

## 2014-07-02 MED ORDER — LIOTHYRONINE SODIUM 25 MCG PO TABS
25 MCG | ORAL_TABLET | Freq: Every day | ORAL | Status: DC
Start: 2014-07-02 — End: 2014-08-27

## 2014-07-02 NOTE — Telephone Encounter (Signed)
LM for patient to continue tx plan of EGD with Dr. Hassell Done 8/25 and then if normal, recommended she f/u with pain specialist, Dr. Diana Eves [phone]    Patient to call if s/s persist or worsen.

## 2014-07-02 NOTE — Progress Notes (Signed)
Psychiatry Consultation  Loralie Champagne., M.D.  07/03/2014  9:51 PM      Time spent with Patient: 30 min    Controlled Substances Monitoring: Attestation: The Prescription Monitoring Report was requested today but not available. Nori Riis Delsa Grana., MD)  Documentation: No signs of potential drug abuse or diversion identified. Nori Riis Delsa Grana., MD)    Reason for Consult:  depression  Referring Provider: Harvel Quale, [place] [address]    Recommendations:    ?? Will augment with Cytomel and follow    Diagnosis:    Axis I  Major depressive disorder; recurrent    Axis II  Coping Skills:  fair    Axis III       Diagnosis Date   ??? Anxiety    ??? Asthma    ??? Chronic back pain    ??? Deep vein thrombosis    ??? Depression    ??? GERD (gastroesophageal reflux disease)      NO LONGER SINCE LAP   ??? Pulmonary embolism (H[facility])    ??? Obesity      hx of; had lap band   ??? Hyperlipidemia      hx; resolved with lap band   ??? GERD (gastroesophageal reflux disease) 01/23/2009   ??? Hypertension         Axis IV  Other psychosocial and environmental problems    Axis V  51-60 moderate symptoms    Examination:      Vital Signs BP 142/94    Wt 285 lb (129.275 kg)     Appearance    alert  Speech    spontaneous, normal rate, normal volume and well articulated  Mood    depressed  Affect    normal affect  Thought Content    intact  Thought Process    linear, goal directed and coherent  Associations    logical connections  Insight    Good  Judgment    Intact  No abnormal movements, tics or mannerisms.    Orientation    oriented to person, place, time, and general circumstances  Memory    recent and remote memory intact  Attention/Concentration    intact  Language    0 - no aphasia, normal  Fund of Knowledge    intact  Suicide Assessment    no suicidal ideation    Clinical Interview: [name] returns with residual depression, primarily lack of energy, despite her antidepressant treatment.  We did check  thyroid labs at the last visit which did not indicate a primary thyroid problem, and I had reviewed (and reviewed again) the possibilities for augmentation of her current agent which was giving a partial response.  We decided to try Cytomel to help with energy and depression.  I discussed the risks, benefits and side effects and she agreed to try the medication.  Will follow.    HPI:     Location: brain    Duration: chronic    Timing: continuous    Severity: moderate    Quality: sad mood, poor energy    Assoc Sx: none additional    Context: partial response to treatment    Modifying factors: none      History:      Past Medical History:    Past Medical History   Diagnosis Date   ??? Anxiety    ??? Asthma    ??? Chronic back pain    ???  Deep vein thrombosis    ??? Depression    ??? GERD (gastroesophageal reflux disease)      NO LONGER SINCE LAP   ??? Pulmonary embolism (HCC)    ??? Obesity      hx of; had lap band   ??? Hyperlipidemia      hx; resolved with lap band   ??? GERD (gastroesophageal reflux disease) 01/23/2009   ??? Hypertension        Past Surgical History:    Past Surgical History   Procedure Laterality Date   ??? Colonoscopy     ??? Upper gastrointestinal endoscopy     ??? Cesarean section     ??? Cholecystectomy     ??? Hysterectomy     ??? Shoulder surgery     ??? Lap band  05/01/08     Dr. Nicole Cella   ??? Back surgery       neck  plates   ??? Colonoscopy  04/08/10   ??? Spine surgery     ??? Other surgical history       [place] Filter: curently in place   ??? Upper gastrointestinal endoscopy  05/19/13   ??? Other surgical history  07/05/13     lap band removal   ??? Upper gastrointestinal endoscopy  07/21/13     ESOPHAGEAL STENT PLACEMENT   ??? Upper gastrointestinal endoscopy  07/21/13     WITH EXCHANGE OF ESOPHAGEAL STENT   ??? Partial hysterectomy         Medications:   Current Outpatient Prescriptions   Medication Sig Dispense Refill   ??? liothyronine (CYTOMEL) 25 MCG tablet Take 1 tablet by mouth daily  30 tablet  3   ??? metaxalone (SKELAXIN) 800 MG tablet  TAKE ONE TABLET BY MOUTH 3 TIMES DAILY  30 tablet  3   ??? Multiple Vitamins-Minerals (MULTIVITAMIN PO) Take by mouth       ??? CALCIUM-VITAMIN D PO Take by mouth       ??? albuterol (PROVENTIL HFA) 108 (90 BASE) MCG/ACT inhaler Inhale 2 puffs into the lungs every 6 hours as needed for Wheezing  3 Inhaler  3   ??? escitalopram (LEXAPRO) 20 MG tablet Take 1 tablet by mouth daily Take 0.5 tablets by mouth daily for a week then increase to a full tablet daily on week 2. (Patient taking differently:   Take 20 mg by mouth daily )  30 tablet  6   ??? metoclopramide (REGLAN) 5 MG tablet Take 1 tablet by mouth 4 times daily (before meals and nightly)  120 tablet  3   ??? sucralfate (CARAFATE) 1 GM tablet TAKE ONE TABLET BY MOUTH EVERY 6 HOURS  120 tablet  1   ??? hydrochlorothiazide (HYDRODIURIL) 25 MG tablet Take 1 tablet by mouth daily  90 tablet  3   ??? lisinopril (PRINIVIL;ZESTRIL) 10 MG tablet Take 1 tablet by mouth daily  90 tablet  3   ??? ALPRAZolam (XANAX) 0.5 MG tablet Take 1 tablet by mouth 2 times daily as needed for Anxiety  60 tablet  2   ??? montelukast (SINGULAIR) 10 MG tablet TAKE ONE TABLET BY MOUTH DAILY  90 tablet  3   ??? nystatin (MYCOSTATIN) 100000 UNIT/GM powder Apply 3 times daily.  3 Bottle  5   ??? fluocinonide (LIDEX) 0.05 % cream Apply topically 2 times daily.  1 Tube  1   ??? acetaminophen 650 MG TABS Take 650 mg by mouth every 4 hours as needed.  120 tablet  3   ??? omeprazole (PRILOSEC) 40 MG capsule Take 1 capsule by mouth daily.  30 capsule  3   ??? cetirizine (ZYRTEC ALLERGY) 10 MG tablet Take 10 mg by mouth daily.         No current facility-administered medications for this visit.       Allergies:  Morphine and Talwin    Social History:   History     Social History   ??? Marital Status: Married     Spouse Name: N/A     Number of Children: N/A   ??? Years of Education: N/A     Occupational History   ??? Not on file.     Social History Main Topics   ??? Smoking status: Former Smoker -- 0.50 packs/day for 35 years     Types:  Cigarettes     Quit date: 08/07/2012   ??? Smokeless tobacco: Never Used   ??? Alcohol Use: No   ??? Drug Use: No   ??? Sexual Activity: Not on file     Other Topics Concern   ??? Not on file     Social History Narrative    ** Merged History Encounter **            TOBACCO:   reports that she quit smoking about 22 months ago. Her smoking use included Cigarettes. She has a 17.5 pack-year smoking history. She has never used smokeless tobacco.  ETOH:   reports that she does not drink alcohol.    Family History:   Family History   Problem Relation Age of Onset   ??? Arthritis Mother    ??? Cancer Mother    ??? Depression Mother    ??? High Blood Pressure Mother    ??? Heart Disease Maternal Grandmother    ??? Early Death Paternal Grandmother    ??? Heart Disease Paternal Grandfather    ??? Diabetes Other    ??? High Blood Pressure Other    ??? Obesity Other    ??? Heart Disease Mother 86     MI    ??? Ovarian Cancer Mother 64   ??? Cancer Father 48     colon   ??? Cancer Mother

## 2014-07-10 NOTE — Telephone Encounter (Signed)
Pt states she is experiencing increased anxiety towards the end of the day    When she goes to bed she cannot relax - is constantly moving.    Please call and advise if there is something else she can take instead  Or if these side effects are just temporary.

## 2014-07-12 NOTE — Telephone Encounter (Signed)
Patient advised to continue prescribed medication until Dr. Venetia Constable returns from vacation.

## 2014-07-16 ENCOUNTER — Inpatient Hospital Stay: Admit: 2014-07-16 | Discharge: 2014-07-17 | Attending: Emergency Medicine

## 2014-07-16 LAB — EKG 12-LEAD
Atrial Rate: 88 {beats}/min
P Axis: 26 degrees
P-R Interval: 136 ms
Q-T Interval: 364 ms
QRS Duration: 76 ms
QTc Calculation (Bazett): 440 ms
R Axis: -6 degrees
T Axis: 61 degrees
Ventricular Rate: 88 {beats}/min

## 2014-07-16 LAB — URINALYSIS WITH REFLEX TO CULTURE
Blood, Urine: NEGATIVE
Glucose, Ur: NEGATIVE mg/dL
Leukocyte Esterase, Urine: NEGATIVE
Nitrite, Urine: NEGATIVE
Protein, UA: 30 mg/dL — AB
Specific Gravity, UA: 1.025 (ref 1.005–1.030)
Urobilinogen, Urine: 0.2 E.U./dL (ref <2.0)
pH, UA: 5.5 (ref 5.0–8.0)

## 2014-07-16 LAB — CBC
Hematocrit: 44.1 % (ref 36.0–48.0)
Hemoglobin: 14.1 g/dL (ref 12.0–16.0)
MCH: 27.1 pg (ref 26.0–34.0)
MCHC: 31.9 g/dL (ref 31.0–36.0)
MCV: 84.8 fL (ref 80.0–100.0)
MPV: 8.7 fL (ref 5.0–10.5)
Platelets: 192 10*3/uL (ref 135–450)
RBC: 5.2 M/uL (ref 4.00–5.20)
RDW: 16.2 % — ABNORMAL HIGH (ref 12.4–15.4)
WBC: 7.6 10*3/uL (ref 4.0–11.0)

## 2014-07-16 LAB — HEPATIC FUNCTION PANEL
ALT: 60 U/L — ABNORMAL HIGH (ref 10–40)
AST: 49 U/L — ABNORMAL HIGH (ref 15–37)
Albumin: 4.3 g/dL (ref 3.4–5.0)
Alkaline Phosphatase: 100 U/L (ref 40–129)
Bilirubin, Direct: <0.2 mg/dL (ref 0.0–0.3)
Total Bilirubin: 0.3 mg/dL (ref 0.0–1.0)
Total Protein: 7.8 g/dL (ref 6.4–8.2)

## 2014-07-16 LAB — MICROSCOPIC URINALYSIS
Epithelial Cells, UA: 7 /HPF — ABNORMAL HIGH (ref 0–5)
RBC, UA: 2 /HPF (ref 0–4)
WBC, UA: 4 /HPF (ref 0–5)

## 2014-07-16 LAB — TROPONIN: Troponin: <0.01 ng/mL (ref <0.01)

## 2014-07-16 LAB — BASIC METABOLIC PANEL
Anion Gap: 12 (ref 3–16)
BUN: 15 mg/dL (ref 7–20)
CO2: 24 mmol/L (ref 21–32)
Calcium: 9.6 mg/dL (ref 8.3–10.6)
Chloride: 104 mmol/L (ref 99–110)
Creatinine: 0.8 mg/dL (ref 0.6–1.1)
GFR African American: >60 (ref >60)
GFR Non-African American: >60 (ref >60)
Glucose: 108 mg/dL — ABNORMAL HIGH (ref 70–99)
Potassium: 4 mmol/L (ref 3.5–5.1)
Sodium: 140 mmol/L (ref 136–145)

## 2014-07-16 LAB — LIPASE: Lipase: 32 U/L (ref 13.0–60.0)

## 2014-07-16 MED ADMIN — ondansetron (ZOFRAN) injection 4 mg: 4 mg | INTRAVENOUS | @ 22:00:00 | NDC 23155037831

## 2014-07-16 MED ADMIN — dimenhyDRINATE injection 75 mg: 75 mg | INTRAVENOUS | @ 22:00:00 | NDC 63323036601

## 2014-07-16 MED ADMIN — meclizine (ANTIVERT) tablet 25 mg: 25 mg | ORAL | @ 23:00:00 | NDC 68084049011

## 2014-07-16 MED ADMIN — 0.9 % sodium chloride bolus: 1000 mL | INTRAVENOUS | @ 22:00:00 | NDC 00338004904

## 2014-07-16 MED FILL — ONDANSETRON HCL 4 MG/2ML IJ SOLN: 4 MG/2ML | INTRAMUSCULAR | Qty: 2

## 2014-07-16 MED FILL — DIMENHYDRINATE 50 MG/ML IJ SOLN: 50 MG/ML | INTRAMUSCULAR | Qty: 2

## 2014-07-16 MED FILL — SODIUM CHLORIDE 0.9 % IV SOLN: 0.9 % | INTRAVENOUS | Qty: 1000

## 2014-07-16 MED FILL — MECLIZINE HCL 12.5 MG PO TABS: 12.5 MG | ORAL | Qty: 2

## 2014-07-16 NOTE — Discharge Instructions (Signed)
Dizziness: After Your Visit  Your Care Instructions  Dizziness is the feeling of unsteadiness or fuzziness in your head. It is different than having vertigo, which is a feeling that the room is spinning or that you are moving or falling. It is also different from lightheadedness, which is the feeling that you are about to faint.  It can be hard to know what causes dizziness. Some people feel dizzy when they have migraine headaches. Sometimes bouts of flu can make you feel dizzy. Some medical conditions, such as heart problems or high blood pressure, can make you feel dizzy. Many medicines can cause dizziness, including medicines for high blood pressure, pain, or anxiety.  If a medicine causes your symptoms, your doctor may recommend that you stop or change the medicine. If it is a problem with your heart, you may need medicine to help your heart work better. If there is no clear reason for your symptoms, your doctor may suggest watching and waiting for a while to see if the dizziness goes away on its own.  Follow-up care is a key part of your treatment and safety. Be sure to make and go to all appointments, and call your doctor if you are having problems. It's also a good idea to know your test results and keep a list of the medicines you take.  How can you care for yourself at home?   If your doctor recommends or prescribes medicine, take it exactly as directed. Call your doctor if you think you are having a problem with your medicine.   Do not drive while you feel dizzy.   Try to prevent falls. Steps you can take include:   Using nonskid mats, adding grab bars near the tub, and using night-lights.   Clearing your home so that walkways are free of anything you might trip on.   Letting family and friends know that you have been feeling dizzy. This will help them know how to help you.  When should you call for help?  Call 911 anytime you think you may need emergency care. For example, call if:   You passed  out (lost consciousness).   You have dizziness along with symptoms of a heart attack. These may include:   Chest pain or pressure, or a strange feeling in the chest.   Sweating.   Shortness of breath.   Nausea or vomiting.   Pain, pressure, or a strange feeling in the back, neck, jaw, or upper belly or in one or both shoulders or arms.   Lightheadedness or sudden weakness.   A fast or irregular heartbeat.   Dizziness occurs with signs of a stroke. These may include:   Sudden numbness, paralysis, or weakness in your face, arm, or leg, especially on only one side of your body.   New problems with walking or balance.   Drooling or slurred speech.   Sudden vision changes.   New problems speaking or understanding simple statements, or feeling confused.   A sudden, severe headache that is different from past headaches.  Call your doctor now or seek immediate medical care if:   You feel dizzy and have a fever, headache, or ringing in your ears.   You have new or increased nausea and vomiting.   Your dizziness does not go away or comes back.  Watch closely for changes in your health, and be sure to contact your doctor if:   You do not get better as expected.   Where can you   learn more?   Go to [email or web address] and sign in to your MyChart account. Enter [phone] in the [place] box to learn more about "Dizziness: After Your Visit."    If you do not have an account, please click on the "Sign Up Now" link.      2006-2015 [employer]. Care instructions adapted under license by [facility]. This care instruction is for use with your licensed healthcare professional. If you have questions about a medical condition or this instruction, always ask your healthcare professional. [place] any warranty or liability for your use of this information.  Content Version: 10.5.422740; Current as of: October 20, 2013                Abdominal Pain:  After Your Visit  Your Care Instructions     Abdominal pain has many possible causes. Some aren't serious and get better on their own in a few days. Others need more testing and treatment. If your pain continues or gets worse, you need to be rechecked and may need more tests to find out what is wrong. You may need surgery to correct the problem.  Don't ignore new symptoms, such as fever, nausea and vomiting, urination problems, pain that gets worse, and dizziness. These may be signs of a more serious problem.  Your doctor may have recommended a follow-up visit in the next 8 to 12 hours. If you are not getting better, you may need more tests or treatment.  The doctor has checked you carefully, but problems can develop later. If you notice any problems or new symptoms, get medical treatment right away.  Follow-up care is a key part of your treatment and safety. Be sure to make and go to all appointments, and call your doctor if you are having problems. It's also a good idea to know your test results and keep a list of the medicines you take.  How can you care for yourself at home?   Rest until you feel better.   To prevent dehydration, drink plenty of fluids, enough so that your urine is light yellow or clear like water. Choose water and other caffeine-free clear liquids until you feel better. If you have kidney, heart, or liver disease and have to limit fluids, talk with your doctor before you increase the amount of fluids you drink.   If your stomach is upset, eat mild foods, such as rice, dry toast or crackers, bananas, and applesauce. Try eating several small meals instead of two or three large ones.   Wait until 48 hours after all symptoms have gone away before you have spicy foods, alcohol, and drinks that contain caffeine.   Do not eat foods that are high in fat.   Avoid anti-inflammatory medicines such as aspirin, ibuprofen (Advil, Motrin), and naproxen (Aleve). These can cause stomach upset. Talk to  your doctor if you take daily aspirin for another health problem.  When should you call for help?  Call 911 anytime you think you may need emergency care. For example, call if:   You passed out (lost consciousness).   You pass maroon or very bloody stools.   You vomit blood or what looks like coffee grounds.   You have new, severe belly pain.  Call your doctor now or seek immediate medical care if:   Your pain gets worse, especially if it becomes focused in one area of your belly.   You have a new or higher fever.   Your  stools are black and look like tar, or they have streaks of blood.   You have unexpected vaginal bleeding.   You have symptoms of a urinary tract infection. These may include:   Pain when you urinate.   Urinating more often than usual.   Blood in your urine.   You are dizzy or lightheaded, or you feel like you may faint.  Watch closely for changes in your health, and be sure to contact your doctor if:   You are not getting better after 1 day (24 hours).   Where can you learn more?   Go to [email or web address] and sign in to your MyChart account. Enter [id number] in the [place] box to learn more about "Abdominal Pain: After Your Visit."    If you do not have an account, please click on the "Sign Up Now" link.      2006-2015 [employer]. Care instructions adapted under license by [facility]. This care instruction is for use with your licensed healthcare professional. If you have questions about a medical condition or this instruction, always ask your healthcare professional. [place] any warranty or liability for your use of this information.  Content Version: 10.5.422740; Current as of: May 10, 2013

## 2014-07-16 NOTE — ED Notes (Signed)
Pt ambulated to bathroom with standby assist of RN. Stable on feet.     Emerson Monte, RN  07/16/14 [id number]

## 2014-07-16 NOTE — Telephone Encounter (Signed)
I spoke to the Pt. She will be going to [facility] ER when her husband (who works at [employer]) goes later today. Pt still crying. Pt stating she is grateful for the help.

## 2014-07-16 NOTE — ED Provider Notes (Signed)
I have personally performed a face to face diagnostic evaluation on this patient. I have fully participated in the care of this patient. I have reviewed and agree with all pertinent clinical information including history, physical exam, diagnostic tests, and the plan.     History and Physical as follows:  The patient had lap band placement by Dr. Nicole Cella about one year ago.  There was an infection, and the lap band was then removed.  Ever since then, the patient has generalized fatigue, intermittent nausea, and variable degree of generalized abdominal pain and pain almost everywhere in her body.  Her abdominal pain today is no worse than her baseline or chronic abdominal pain.    Vertigo also started about one year ago, and the patient says that as a separate problem.  Vertigo initially happened only occasionally, but now has been coming daily for the past 2 weeks.  Vertigo is experienced as a spinning sensation, and it is "a lot worse" when the patient moves her head.  Today she called Dr. Murrell Converse, was told to go on to the emergency department.  The patient takes multiple medicines daily, including ondansetron by mouth twice daily, every day.  She says that ondansetron does very little to relieve her nausea, and that Phenergan does not work any better.  Review of systems: Difficulty swallowing for the past one year, blamed on scar tissue.  No tinnitus, no muffled hearing, no diplopia, no slurred speech.  My examination, the patient is alert.  Extraocular movements conjugate, without nystagmus.  TMs clear.  Neck supple, no JVD, no bruit.  Breath sounds clear and equal.  Heart rhythm regular.  No murmur.  Abdomen obese, with bowel sounds present, his surgical incisional scar is well-healed.  The abdomen is nontender in all 4 quadrants.  5 out of 5 strength intact upper and lower extremities bilaterally.  Vestibulo-occular reflex intact. No truncal ataxia, no limb ataxia.  Cranial nerves II through VII intact.  Deep  tendon reflexes 2+ bilateral biceps and patellar.  CBC and metabolic were unremarkable.  Urinalysis showed 2+ oxalate crystals.  Chest PA and lateral x-rays were read by the radiologist as showing no acute intrathoracic abnormality.  Impression:  1.  Generalized body pain on chronic basis-- consider possible fibromyalgia.  2.  Diffuse abdominal pain, no worse than the patient's baseline or chronic.  3.  Positional vertigo without any cerebellar symptom or sign, strongly suggesting peripheral cause.    Julaine Hua, MD  07/18/14 [id number]

## 2014-07-16 NOTE — Telephone Encounter (Signed)
Pt is calling about her body aches x 1 year. Pt also has fatigue. Pt sits at night and cries, every single night. Pt hurts from head to toe. Pt gets no relief from the pain. Pt told me she wants to jump off a bridge. Pt now crying on the phone. Pt stating she has trouble laying in bed.

## 2014-07-17 MED ADMIN — oxyCODONE-acetaminophen (PERCOCET) 5-325 MG per tablet 1 tablet: 1 | ORAL | @ 02:00:00 | NDC 68084035511

## 2014-07-17 MED FILL — OXYCODONE-ACETAMINOPHEN 5-325 MG PO TABS: 5-325 MG | ORAL | Qty: 1

## 2014-07-17 NOTE — Telephone Encounter (Signed)
Noted.  Was evaluated in the ER.

## 2014-07-17 NOTE — ED Provider Notes (Signed)
[facility]  eMERGENCY dEPARTMENT eNCOUnter        Pt Name: Rachael Mays  MRN: [id number]  [place] 20-Dec-1955  Date of evaluation: 07/16/2014  Provider: Jeral Fruit, PA  PCP: Harvel Quale, MD  ED Attending: Julaine Hua, MD    CHIEF COMPLAINT       Chief Complaint   Patient presents with   ??? Dizziness     pt c/o dizziness, aching, "can't sleep" ongoing for the past year. pt states she feels like the room is spinning.       HISTORY OF PRESENT ILLNESS   (Location/Symptom, Timing/Onset, Context/Setting, Quality, Duration, Modifying Factors, Severity)  Note limiting factors.     Rachael Mays is a 58 y.o. female  who presents to ED with multiple complaints.  Patient states she has had abdominal pain, body aches, and dizziness.  Patient states current symptoms have been present for the past year and "they can't find out what's wrong".  Patient states she had a lap band placed by Dr. Ilean Skill last year that got infected and eroded.  Patient states since then she has had symptoms that include vertigo, abdominal pain, and diffuse body aches.  Patient states pain is diffuse and cannot be located and rates 8/10.  Patient describes the pain as a diffuse aching that is constant.  Patient states she has associated nausea without vomiting.  Patient denies any headaches, fever/chills, chest pain, shortness of breath, urinary symptoms, or change in bowel habits.  Patient states she called her primary care provider, Dr. Murrell Converse, today who told her to go to the ED.  Patient states she does not feel any worse than normal and is just "tired of the constant pain."  Patient states the past medical history of anxiety, asthma, PE, DVT, and hypertension.     Nursing Notes were all reviewed and agreed with or any disagreements were addressed  in the HPI.    REVIEW OF SYSTEMS    (2-9 systems for level 4, 10 or more for level 5)     Review of Systems   Constitutional: Positive for activity change  and fatigue. Negative for fever, chills and appetite change.   Respiratory: Negative.  Negative for cough, chest tightness and shortness of breath.    Cardiovascular: Negative.  Negative for chest pain.   Gastrointestinal: Negative for nausea, vomiting, abdominal pain, diarrhea and constipation.   Genitourinary: Negative for dysuria, urgency, frequency, hematuria, vaginal bleeding, vaginal discharge and difficulty urinating.   Musculoskeletal: Positive for myalgias. Negative for joint swelling, arthralgias, gait problem, neck pain and neck stiffness.   Skin: Negative for rash.   Neurological: Positive for dizziness and weakness. Negative for syncope, facial asymmetry, speech difficulty, light-headedness, numbness and headaches.   Psychiatric/Behavioral: Negative for confusion and decreased concentration.       Except as noted above the remainder of the review of systems was reviewed and negative.       PAST MEDICAL HISTORY     Past Medical History   Diagnosis Date   ??? Anxiety    ??? Asthma    ??? Chronic back pain    ??? Deep vein thrombosis    ??? Depression    ??? GERD (gastroesophageal reflux disease)      NO LONGER SINCE LAP   ??? Pulmonary embolism (HCC)    ??? Obesity      hx of; had lap band   ??? Hyperlipidemia      hx;  resolved with lap band   ??? GERD (gastroesophageal reflux disease) 01/23/2009   ??? Hypertension          SURGICAL HISTORY       Past Surgical History   Procedure Laterality Date   ??? Colonoscopy     ??? Upper gastrointestinal endoscopy     ??? Cesarean section     ??? Cholecystectomy     ??? Hysterectomy     ??? Shoulder surgery     ??? Lap band  05/01/08     Dr. Nicole Cella   ??? Back surgery       neck  plates   ??? Colonoscopy  04/08/10   ??? Spine surgery     ??? Other surgical history       [place] Filter: curently in place   ??? Upper gastrointestinal endoscopy  05/19/13   ??? Other surgical history  07/05/13     lap band removal   ??? Upper gastrointestinal endoscopy  07/21/13     ESOPHAGEAL STENT PLACEMENT   ??? Upper gastrointestinal  endoscopy  07/21/13     WITH EXCHANGE OF ESOPHAGEAL STENT   ??? Partial hysterectomy           CURRENT MEDICATIONS       Discharge Medication List as of 07/16/2014  9:55 PM      CONTINUE these medications which have NOT CHANGED    Details   liothyronine (CYTOMEL) 25 MCG tablet Take 1 tablet by mouth daily, Disp-30 tablet, R-3      metaxalone (SKELAXIN) 800 MG tablet TAKE ONE TABLET BY MOUTH 3 TIMES DAILY, Disp-30 tablet, R-3      Multiple Vitamins-Minerals (MULTIVITAMIN PO) Take by mouth      CALCIUM-VITAMIN D PO Take by mouth      albuterol (PROVENTIL HFA) 108 (90 BASE) MCG/ACT inhaler Inhale 2 puffs into the lungs every 6 hours as needed for Wheezing, Disp-3 Inhaler, R-3      metoclopramide (REGLAN) 5 MG tablet Take 1 tablet by mouth 4 times daily (before meals and nightly), Disp-120 tablet, R-3      sucralfate (CARAFATE) 1 GM tablet TAKE ONE TABLET BY MOUTH EVERY 6 HOURS, Disp-120 tablet, R-1      hydrochlorothiazide (HYDRODIURIL) 25 MG tablet Take 1 tablet by mouth daily, Disp-90 tablet, R-3      lisinopril (PRINIVIL;ZESTRIL) 10 MG tablet Take 1 tablet by mouth daily, Disp-90 tablet, R-3      ALPRAZolam (XANAX) 0.5 MG tablet Take 1 tablet by mouth 2 times daily as needed for Anxiety, Disp-60 tablet, R-2      montelukast (SINGULAIR) 10 MG tablet TAKE ONE TABLET BY MOUTH DAILY, Disp-90 tablet, R-3      nystatin (MYCOSTATIN) 100000 UNIT/GM powder Apply 3 times daily., Disp-3 Bottle, R-5, Normal      fluocinonide (LIDEX) 0.05 % cream Apply topically 2 times daily., Disp-1 Tube, R-1, Normal      acetaminophen 650 MG TABS Take 650 mg by mouth every 4 hours as needed., Disp-120 tablet, R-3      omeprazole (PRILOSEC) 40 MG capsule Take 1 capsule by mouth daily., Disp-30 capsule, R-3      cetirizine (ZYRTEC ALLERGY) 10 MG tablet Take 10 mg by mouth daily.             ALLERGIES     Morphine and Talwin    FAMILY HISTORY       Family History   Problem Relation Age of Onset   ??? Arthritis Mother    ???  Cancer Mother    ??? Depression  Mother    ??? High Blood Pressure Mother    ??? Heart Disease Maternal Grandmother    ??? Early Death Paternal Grandmother    ??? Heart Disease Paternal Grandfather    ??? Diabetes Other    ??? High Blood Pressure Other    ??? Obesity Other    ??? Heart Disease Mother 2     MI    ??? Ovarian Cancer Mother 51   ??? Cancer Father 55     colon   ??? Cancer Mother           SOCIAL HISTORY       History     Social History   ??? Marital Status: Married     Spouse Name: N/A     Number of Children: N/A   ??? Years of Education: N/A     Social History Main Topics   ??? Smoking status: Former Smoker -- 0.50 packs/day for 35 years     Types: Cigarettes     Quit date: 08/07/2012   ??? Smokeless tobacco: Never Used   ??? Alcohol Use: No   ??? Drug Use: No   ??? Sexual Activity: None     Other Topics Concern   ??? None     Social History Narrative    ** Merged History Encounter **            SCREENINGS             PHYSICAL EXAM    (up to 7 for level 4, 8 or more for level 5)   ED Triage Vitals   BP Temp Temp Source Pulse Resp SpO2 Height Weight   07/16/14 1523 07/16/14 1523 07/16/14 1523 07/16/14 1523 07/16/14 1523 07/16/14 1523 07/16/14 1523 07/16/14 1523   161/82 mmHg 99.1 ??F (37.3 ??C) Oral 92 16 94 % 5\' 6"  (1.676 m) 270 lb (122.471 kg)       Physical Exam   Constitutional: She is oriented to person, place, and time. She appears well-developed and well-nourished.   HENT:   Head: Normocephalic and atraumatic.   Right Ear: External ear normal.   Left Ear: External ear normal.   Eyes: EOM are normal. Pupils are equal, round, and reactive to light. Right eye exhibits no discharge. Left eye exhibits no discharge.   Neck: Normal range of motion. Neck supple.   Cardiovascular: Normal rate, regular rhythm and normal heart sounds.  Exam reveals no gallop and no friction rub.    No murmur heard.  Pulmonary/Chest: Effort normal and breath sounds normal. No stridor. No respiratory distress. She has no wheezes. She has no rales.   Abdominal: Soft. Normal appearance and bowel  sounds are normal. She exhibits no distension. There is generalized tenderness. There is no rigidity, no rebound, no guarding, no CVA tenderness, no tenderness at McBurney's point and negative Murphy's sign.   Musculoskeletal: Normal range of motion.   Neurological: She is alert and oriented to person, place, and time. She has normal strength and normal reflexes. No cranial nerve deficit or sensory deficit.   Skin: Skin is warm and dry. She is not diaphoretic. No erythema. No pallor.   Psychiatric: She has a normal mood and affect. Her behavior is normal.       DIAGNOSTIC RESULTS   LABS:    Labs Reviewed   CBC - Abnormal; Notable for the following:     RDW 16.2 (*)     All  other components within normal limits   BASIC METABOLIC PANEL - Abnormal; Notable for the following:     Glucose 108 (*)     All other components within normal limits   URINE RT REFLEX TO CULTURE - Abnormal; Notable for the following:     Bilirubin Urine SMALL (*)     Ketones, Urine TRACE (*)     Protein, UA 30 (*)     All other components within normal limits   HEPATIC FUNCTION PANEL - Abnormal; Notable for the following:     ALT 60 (*)     AST 49 (*)     All other components within normal limits   MICROSCOPIC URINALYSIS - Abnormal; Notable for the following:     Bacteria, UA 2+ (*)     Crystals 2+ Ca. Oxalate (*)     Epi Cells 7 (*)     All other components within normal limits   TROPONIN   LIPASE       All other labs were within normal range or not returned as of this dictation.    EKG: All EKG's are interpreted by the Emergency Department Physician who either signs or Co-signs this chart in the absence of a cardiologist.    Normal sinus rhythm with ventricular rate of 88 bpm.    RADIOLOGY:   Non-plain film images such as CT, Ultrasound and MRI are read by the radiologist. Plain radiographic images are visualized and preliminarily interpreted by the  ED Provider with the below findings:      Interpretation per the Radiologist below, if  available at the time of this note:    XR CHEST STANDARD TWO VW    Final Result: IMPRESSION:           1. No acute intrathoracic abnormality is detected.   XR CHEST PORTABLE    (Canceled)     Xr Chest Standard Two Vw    07/16/2014   XR CHEST STANDARD TWO VW      Jul 16, 2014 04:29:10 PM   INDICATION:   "numbness "   COMPARISON: 05/03/2014   FINDINGS: The cardiomediastinal silhouette and pulmonary vascularity are normal. The lungs are clear without consolidation, nodule or pleural effusion.      07/16/2014   IMPRESSION:    1. No acute intrathoracic abnormality is detected.      PROCEDURES   Unless otherwise noted below, none     Procedures    CRITICAL CARE TIME   N/A    CONSULTS:  IP CONSULT TO INTERNAL MEDICINE  IP CONSULT TO INTERNAL MEDICINE    EMERGENCY DEPARTMENT COURSE and DIFFERENTIAL DIAGNOSIS/MDM:   Vitals:    Filed Vitals:    07/16/14 1523   BP: 161/82   Pulse: 92   Temp: 99.1 ??F (37.3 ??C)   TempSrc: Oral   Resp: 16   Height: 5\' 6"  (1.676 m)   Weight: 270 lb (122.471 kg)   SpO2: 94%       Patient was given the following medications:  Medications   ondansetron (ZOFRAN) injection 4 mg (4 mg Intravenous Given 07/16/14 1802)   dimenhyDRINATE injection 75 mg (75 mg Intravenous Given 07/16/14 1801)   0.9 % sodium chloride bolus (0 mLs Intravenous Stopped 07/16/14 1919)   meclizine (ANTIVERT) tablet 25 mg (25 mg Oral Given 07/16/14 1923)   oxyCODONE-acetaminophen (PERCOCET) 5-325 MG per tablet 1 tablet (1 tablet Oral Given 07/16/14 2222)     Patient is a 58 year old female presents with complaint  of dizziness, abdominal pain, and diffuse body aches.  Patient states symptoms have been going on for the past year after she had an infected lap band removed.  Patient states she has seen multiple different specialists and her primary care provider extensively with no relief of her symptoms.  Patient states current symptoms are about her baseline and do not seem to be any worse.  Patient states she called her primary care  provider who told her to come on in and she would get her admitted.  I paged patient's primary care provider, Dr. Murrell Converse, who stated she had patient, and because patient stated "the pain was so bad she felt like jumping off a bridge."  Dr. Murrell Converse states she had the patient come the ED to rule out any suicidal behavior.  After further discussion with Dr. Murrell Converse I told her I did feel the patient was suicidal or at risk for harm to herself or others.  Dr. Murrell Converse stated if her workup was normal then she could be discharged home with outpatient follow-up.  Dr. Murrell Converse did request that I further clarified with the patient any risk for suicidal or homicidal behavior.  After further discussion with the patient there is no evidence of suicidal or homicidal behavior.  I attempted to re-page Dr. Murrell Converse multiple times for consultation.  Dr. Murrell Converse did not return my page and the patient will be discharged home with instructions to follow up with her primary care provider for further evaluation and treatment.  Patient is neurologically intact throughout.  Patient was able to ambulate without assistance.  Patient was stable on her feet.  The patient is likely suffering from chronic pain and vertigo.  Patient was given Zofran, dimenhydrinate, fluids, Antivert, and Percocet here in the ED with minimal relief.  Patient states she has taken all his medications with no relief in the past.  I offered to give her prescription of Antivert to go home with and she states it doesn't work and doesn't want the prescription.  No evidence of ACS, PE, pneumonia, CVA/TIA, subarachnoid hemorrhage, cerebellar infarct, surgical abdomen, meningitis, or encephalitis.  Patient will be discharged home with instructions to follow with her primary care provider for further evaluation and treatment.    The patient tolerated their visit well.  They were seen and evaluated by the attending physician who agreed with the assessment and plan.  The patient and /  or the family were informed of the results of any tests, a time was given to answer questions, a plan was proposed and they agreed with plan.        FINAL IMPRESSION      1. Dizziness    2. Abdominal pain, right upper quadrant          DISPOSITION/PLAN   DISPOSITION Decision to Discharge    PATIENT REFERRED TO:  Harvel Quale, [place] [address]  [phone]      For a Re-check in   7-10   days.      DISCHARGE MEDICATIONS:  Discharge Medication List as of 07/16/2014  9:55 PM             (Please note that portions of this note were completed with a voice recognition program.  Efforts were made to edit the dictations but occasionally words are mis-transcribed.)    Jeral Fruit, PA (electronically signed)            Chaya Jan, PA  07/17/14  1813

## 2014-07-17 NOTE — Telephone Encounter (Signed)
Pt called on 8/4 regarding her medication.  (see encounter from 8/4)      She said the medication she is taking starts w/ a C, but she doesn't know what its called.     She feels that it is making her anxiety worse.  She is crying and moody.    She said she was told that when she called on the 4th that Dr. Beacher May was on vacation and she would hear something when he returned.    Is he back yet?    Her # is [phone]

## 2014-07-17 NOTE — Care Coordination-Inpatient (Signed)
Called to follow up with patient about current health issues. No answer. Left general message with call back phone number

## 2014-07-17 NOTE — Telephone Encounter (Signed)
For your review

## 2014-07-18 NOTE — Telephone Encounter (Signed)
Pt stated that since she started the Cytomel 7/27 she's been having crying spells, palpitations, and more anxiety. She d/c the lexapro because she stated that she thought that's what you wanted her to do but she stated that when she read about this drug online it's to be taking with an antidepressant. Now she believes that she was suppose to continue the lexapro. I reviewed your note and it does state that the Cytomel will be used to augment. So patient should start back taking the Lexapro? She's been off the lexapro since she started the Cytomel.Please advise.

## 2014-07-18 NOTE — Telephone Encounter (Signed)
Patient notified. Patient is aware [facility] will have the Lexapro ready for her in approx 30 minutes.

## 2014-07-18 NOTE — Telephone Encounter (Signed)
I'm guess patient is referring to the Cytomel. I called patient left her a voicemail letting patient know that Dr. Venetia Constable is back in the office but I needed to gather more information regarding her side effects to this drug. I left the number to the [place] office since I'll be here all day.

## 2014-07-18 NOTE — Telephone Encounter (Signed)
Yes, she can restart the Lexapro.  Cytomel is not a stand alone medication for depression and she was never supposed to discontinue the Lexapro.  My instruction to her was to take both.

## 2014-07-19 NOTE — Telephone Encounter (Signed)
Pt wants to know if we have samples of Cytomel 25mg . (prescribed by Dr. Keturah Barre)  Pt is waiting on mail order to arrive.

## 2014-07-19 NOTE — Telephone Encounter (Signed)
Patient is informed that we do not carry samples.

## 2014-07-20 NOTE — Telephone Encounter (Signed)
PAT stated pt cancelled her EGD with [place] due to having an appt with Dr. Earlie Server office. I sent pt email to confirm this.

## 2014-07-23 NOTE — Care Coordination-Inpatient (Signed)
Rachael Mays  07/23/2014                Nurse Care Coordinator Progress Note  Summary:    - patient s/p bariatric surgery among other issues.    Assessment: Rachael Mays is a 58 y.o. female. Called for 1 month f/u and ed f/u. States there was confusion about her medications and she was having some issues with crying and suicidal thoughts. She is now feeling like she has more energy and is more positive about her health.  She is having EGD on 8/25.  She is wanting to get her physical issues resolved and then would be interested in some counseling.      Goals    ??? Exercise 3x per week (30 min per time)     ??? Weight < 200 lb (90.719 kg)         Barriers to meeting goals: impairment:  physical: chronic pain and GI issues and mental health: depression    Medication Reconciliation performed: yes    Action:  - reviewed med changes.  Assessed for any immediate needs.  Patient states she is stable.  Wants to go deeper with counseling s/p physical tx.    Plan of Care:  -follow monthly and PRN.  Assist with needs as they arise.   Future Appointments  Date Time Provider [place]   07/31/2014 10:30 AM Rachael Levo, MD [facility] None   08/27/2014 11:30 AM Rachael Mays., MD [facility]             Rachael Mays  Nurse Care Coordinator

## 2014-07-27 NOTE — Patient Instructions (Signed)
Rachael Mays was seen today for pharyngitis.    Diagnoses and associated orders for this visit:    Sore throat  - POCT rapid strep A         Treat symptomatically and call next week if symptoms worsen.

## 2014-07-27 NOTE — Progress Notes (Signed)
Subjective:      Patient ID: Rachael Mays is a 58 y.o. female.    Pharyngitis  This is a new problem. The current episode started yesterday. Associated symptoms include coughing, fatigue and a sore throat. Pertinent negatives include no chills, congestion, fever, headaches, nausea, rash or vomiting. Associated symptoms comments: Ears feel clogged.   She has a mild cough and congestion. She has an EGD next week so she wanted to make sure she didn't have anything and she doesn't know if she wants to do it if she still has a ST. She hasn't taken anything for it because it just started last night. No n/v/d.    Review of Systems   Constitutional: Positive for fatigue. Negative for fever, chills and appetite change.   HENT: Positive for postnasal drip, sore throat and trouble swallowing. Negative for congestion and ear pain.    Eyes: Negative for pain.   Respiratory: Positive for cough. Negative for shortness of breath and wheezing.    Gastrointestinal: Negative for nausea, vomiting and diarrhea.   Skin: Negative for rash.   Neurological: Negative for dizziness and headaches.   Psychiatric/Behavioral: Negative for sleep disturbance.       Objective:   Physical Exam   Constitutional: She is oriented to person, place, and time. Vital signs are normal. She appears well-developed and well-nourished. She is cooperative.   HENT:   Right Ear: Tympanic membrane and ear canal normal.   Left Ear: Tympanic membrane and ear canal normal.   Nose: Mucosal edema present. Right sinus exhibits no maxillary sinus tenderness and no frontal sinus tenderness. Left sinus exhibits no maxillary sinus tenderness and no frontal sinus tenderness.   Mouth/Throat: Mucous membranes are normal. Posterior oropharyngeal erythema present. No oropharyngeal exudate or posterior oropharyngeal edema.   Neck: Neck supple.   Cardiovascular: Normal rate, regular rhythm and normal heart sounds.    Pulmonary/Chest: Effort normal and breath sounds normal. No  respiratory distress. She has no decreased breath sounds.   Lymphadenopathy:     She has no cervical adenopathy.   Neurological: She is alert and oriented to person, place, and time.       Assessment:      Rachael Mays was seen today for pharyngitis.    Diagnoses and associated orders for this visit:    Sore throat  - POCT rapid strep A               Plan:      Treat symptomatically and call next week if not improving.

## 2014-07-27 NOTE — Progress Notes (Signed)
I have reviewed the history and physical note and findings.

## 2014-07-30 NOTE — Patient Instructions (Signed)
preop instructions left on vmail per protocol.

## 2014-07-31 MED ORDER — HYDROMORPHONE HCL PF 2 MG/ML IJ SOLN
2 | INTRAMUSCULAR | Status: DC | PRN
Start: 2014-07-31 — End: 2014-08-01

## 2014-07-31 MED ORDER — LIDOCAINE HCL (PF) 1 % IJ SOLN
1 | Freq: Once | INTRAMUSCULAR | Status: AC | PRN
Start: 2014-07-31 — End: 2014-07-31

## 2014-07-31 MED ADMIN — lactated ringers infusion: INTRAVENOUS | @ 16:00:00 | NDC 00338011704

## 2014-07-31 MED ADMIN — midazolam (VERSED) injection 1 mg: 1 mg | INTRAVENOUS | @ 15:00:00 | NDC 00641605710

## 2014-07-31 MED ADMIN — ondansetron (ZOFRAN) injection 4 mg: 4 mg | INTRAVENOUS | @ 15:00:00 | NDC 23155019631

## 2014-07-31 MED FILL — MIDAZOLAM HCL 2 MG/2ML IJ SOLN: 2 MG/ML | INTRAMUSCULAR | Qty: 2

## 2014-07-31 MED FILL — ONDANSETRON HCL 4 MG/2ML IJ SOLN: 4 MG/2ML | INTRAMUSCULAR | Qty: 2

## 2014-07-31 MED FILL — LACTATED RINGERS IV SOLN: INTRAVENOUS | Qty: 1000

## 2014-07-31 NOTE — Progress Notes (Signed)
Teaching/ education completed for home care including pain management, wound care,activity,safety precautions and infection control. Patient and spouse verbalized understanding.

## 2014-07-31 NOTE — Progress Notes (Signed)
Scope number verified for HLD via label printout prior to use. H and P and consent completed. Verified using 2 person system.family in waiting room.

## 2014-07-31 NOTE — Anesthesia Pre-Procedure Evaluation (Signed)
Rachael Mays     Anesthesia Evaluation      Airway   Mallampati: II  TM distance: >3 FB  Neck ROM: full  Dental      Pulmonary - normal exam    breath sounds clear to auscultation  (+) asthma (last albuterol use several months ago),   ROS comment: DVT 12 yrs ago and PE 25 yrs ago, not on anticoag  Cardiovascular - normal exam  (+) hypertension,   (-) dysrhythmias, CHF    Rhythm: regular  Rate: normal  ROS comment: Cath 2013 coronaries WNL  No CP since  EF 60%  High cholesterol  ROS comment: Cath 2013 coronaries WNL  No CP since  EF 60%  High cholesterol    Neuro/Psych    (-) seizures, TIA, CVA  Comments: "plates in neck" but FROM  GI/Hepatic/Renal    (+) GERD,     Comments: chronic nausea    Endo/Other    Abdominal                     Allergies: Morphine and Talwin    NPO Status: Time of last liquid consumption: 270more than 8 hrs                       Time of last solid food consumption: 2130    Anesthesia Plan    ASA 3     general   (GEN Natural airway)  intravenous induction   Anesthetic plan and risks discussed with patient.    Plan discussed with CRNA.      MEDICATIONS:  Current Outpatient Prescriptions   Medication Sig Dispense Refill   ??? liothyronine (CYTOMEL) 25 MCG tablet Take 1 tablet by mouth daily  30 tablet  3   ??? metaxalone (SKELAXIN) 800 MG tablet TAKE ONE TABLET BY MOUTH 3 TIMES DAILY  30 tablet  3   ??? Multiple Vitamins-Minerals (MULTIVITAMIN PO) Take by mouth       ??? CALCIUM-VITAMIN D PO Take by mouth       ??? albuterol (PROVENTIL HFA) 108 (90 BASE) MCG/ACT inhaler Inhale 2 puffs into the lungs every 6 hours as needed for Wheezing  3 Inhaler  3   ??? metoclopramide (REGLAN) 5 MG tablet Take 1 tablet by mouth 4 times daily (before meals and nightly)  120 tablet  3   ??? sucralfate (CARAFATE) 1 GM tablet TAKE ONE TABLET BY MOUTH EVERY 6 HOURS  120 tablet  1   ??? hydrochlorothiazide (HYDRODIURIL) 25 MG tablet Take 1 tablet by mouth daily  90 tablet  3   ??? lisinopril (PRINIVIL;ZESTRIL) 10 MG tablet Take 1  tablet by mouth daily  90 tablet  3   ??? ALPRAZolam (XANAX) 0.5 MG tablet Take 1 tablet by mouth 2 times daily as needed for Anxiety  60 tablet  2   ??? montelukast (SINGULAIR) 10 MG tablet TAKE ONE TABLET BY MOUTH DAILY  90 tablet  3   ??? nystatin (MYCOSTATIN) 100000 UNIT/GM powder Apply 3 times daily.  3 Bottle  5   ??? fluocinonide (LIDEX) 0.05 % cream Apply topically 2 times daily.  1 Tube  1   ??? acetaminophen 650 MG TABS Take 650 mg by mouth every 4 hours as needed.  120 tablet  3   ??? omeprazole (PRILOSEC) 40 MG capsule Take 1 capsule by mouth daily.  30 capsule  3   ??? cetirizine (ZYRTEC ALLERGY) 10 MG tablet Take  10 mg by mouth daily.         Current Facility-Administered Medications   Medication Dose Route Frequency Provider Last Rate Last Dose   ??? HYDROmorphone HCl PF (DILAUDID) injection 0.25 mg  0.25 mg Intravenous Q5 Min PRN Georges Mouse, MD       ??? HYDROmorphone HCl PF (DILAUDID) injection 0.5 mg  0.5 mg Intravenous Q5 Min PRN Georges Mouse, MD       ??? HYDROmorphone HCl PF (DILAUDID) injection 0.5 mg  0.5 mg Intravenous Q5 Min PRN Georges Mouse, MD       ??? meperidine (DEMEROL) injection 12.5 mg  12.5 mg Intravenous Q5 Min PRN Georges Mouse, MD            LABS:  Lab Results   Component Value Date    WBC 7.6 07/16/2014    HGB 14.1 07/16/2014    HCT 44.1 07/16/2014    MCV 84.8 07/16/2014    PLT 192 07/16/2014    GLUCOSE 108* 07/16/2014    PROTIME 10.9 05/15/2011    INR 0.95 05/15/2011    APTT 28.3 05/15/2011       Lab Results   Component Value Date    NA 140 07/16/2014    K 4.0 07/16/2014    CL 104 07/16/2014    CO2 24 07/16/2014    PHOS 3.8 08/21/2013    BUN 15 07/16/2014    CREATININE 0.8 07/16/2014       Problem List:  Patient Active Problem List   Diagnosis   ??? Back Pain   ??? Deep Vein Thrombosis   ??? Depression   ??? High Cholesterol   ??? Hypertension   ??? Pulmonary Embolism   ??? Nausea & vomiting   ??? Abdominal  pain, other specified site   ??? Morbid obesity ([place])   ??? Status post gastric banding   ??? Vertigo   ??? Dizziness   ???  Tinnitus, subjective   ??? Meniere syndrome   ??? Complication of gastric banding   ??? Wound infection ([place])   ??? HTN (hypertension)   ??? Hyperglycemia   ??? Chest pain         Georges Mouse, MD  07/31/2014

## 2014-07-31 NOTE — Progress Notes (Signed)
Discharge instructions reviewed with patient and spouse. All home medications have been reviewed, questions answered and patient and spouse verbalized understanding.  Discharge instructions signed and copies given. Patient discharged per w/c with belongings.spouse transporting home

## 2014-07-31 NOTE — Progress Notes (Signed)
Dr. Martin at bedside talking to patient and spouse

## 2014-07-31 NOTE — Progress Notes (Signed)
Taking ice chips ,passing gas.

## 2014-07-31 NOTE — Anesthesia Post-Procedure Evaluation (Signed)
Anesthesia Post-op Note    Patient: DUA MEHLER    Procedure(s) Performed: Esophagogastroduodenoscopy with esophageal balloon dilation   Anesthesia type: General    Patient location: PACU    Post-op pain: Adequate analgesia    Post-op assessment: no apparent anesthetic complications, tolerated procedure well and no evidence of recall    Last Vitals: BP 147/77 mmHg   Pulse 76   Temp(Src) 97.5 ??F (36.4 ??C) (Temporal)   Resp 16   Ht 5\' 6"  (1.676 m)   Wt 278 lb (126.1 kg)   BMI 44.89 kg/m2   SpO2 97%    Post-op vital signs: stable    Aldrete Phase I: Aldrete Score: 10    Aldrete Phase II:      Post-op Nausea & Vomiting: no nausea and no vomiting    Post-op Hydration: euvolemic    Level of consciousness: awake, alert  and oriented    Complications: none    Georges Mouse, MD  12:54 PM

## 2014-07-31 NOTE — Op Note (Signed)
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 year old female who presents today with persisting epigastric pain and dysphagia.      Description of Procedure:  Informed consent was obtained from the patient after explanation of indications, benefits and possible risks and complications of the procedure.  The patient was then taken to the endoscopy suite, placed in the left lateral decubitus position and the above IV sedation was administrered.    The Olympus videoendoscope was placed in the patient's mouth and under direct visualization passed into the esophagus.  Visualization of the esophagus demonstrated granulation tissue from 34 - 36 cm c/w deformity left from previous esophageal stent.  No stenosis was noted.  Just proximal to the gastroesophageal junction, a second lumen was noted laterally c/w diverticulum, but likely represents defect left by eroded lap band.       The scope was then advanced into the stomach.    Visualization of the gastric body and antrum demonstrated a moderate amount of retained food.  A retroflexed exam of the gastric cardia and fundus demonstrated normal mucosa.  The pylorus was patent and the scope was advanced into the duodenum.  Visualization of the duodenal bulb demonstrated normal mucosa.  The second portion of the duodenum demonstrated normal mucosa.    The scope was then withdrawn back into the stomach, it was decompressed, and the scope was completely withdrawn.    The patient tolerated the  procedure well and was taken to the post anesthesia care unit in good condition.    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     3) Follow up with me in 2 - 3 months.    Norwood Levo, [place]

## 2014-07-31 NOTE — Progress Notes (Signed)
PHASE 2 - AWAKE,ROOM AIR,CONSUMING PO FLUIDS TOLERATING WELL,DENIES PAIN,SPOUSE AT BEDSIDE,VSS.

## 2014-07-31 NOTE — Progress Notes (Signed)
Teaching / education initiated regarding perioperative experience, expectations, and pain management during stay. Patient verbalized understanding.

## 2014-07-31 NOTE — H&P (Signed)
[facility]   Pre-operative History and Physical    Patient: Rachael Mays  DOB: 1956-05-26  Acct#: [id number]    History Obtained From:  patient, electronic medical record    HISTORY OF PRESENT ILLNESS:    The patient is a 58 y.o. 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    Past Medical History:        Diagnosis Date   ??? Anxiety    ??? Asthma    ??? Chronic back pain    ??? Deep vein thrombosis    ??? Depression    ??? GERD (gastroesophageal reflux disease)      NO LONGER SINCE LAP   ??? Pulmonary embolism (HCC)    ??? Obesity      hx of; had lap band   ??? Hyperlipidemia      hx; resolved with lap band   ??? GERD (gastroesophageal reflux disease) 01/23/2009   ??? Hypertension      Past Surgical History:        Procedure Laterality Date   ??? Colonoscopy     ??? Upper gastrointestinal endoscopy     ??? Cesarean section     ??? Cholecystectomy     ??? Hysterectomy     ??? Shoulder surgery     ??? Lap band  05/01/08     Dr. Nicole Cella   ??? Back surgery       neck  plates   ??? Colonoscopy  04/08/10   ??? Spine surgery     ??? Other surgical history       [place] Filter: curently in place   ??? Upper gastrointestinal endoscopy  05/19/13   ??? Other surgical history  07/05/13     lap band removal   ??? Upper gastrointestinal endoscopy  07/21/13     ESOPHAGEAL STENT PLACEMENT   ??? Upper gastrointestinal endoscopy  07/21/13     WITH EXCHANGE OF ESOPHAGEAL STENT   ??? Partial hysterectomy       Medications Prior to Admission:   Current Outpatient Prescriptions on File Prior to Encounter   Medication Sig Dispense Refill   ??? liothyronine (CYTOMEL) 25 MCG tablet Take 1 tablet by mouth daily  30 tablet  3   ??? metaxalone (SKELAXIN) 800 MG tablet TAKE ONE TABLET BY MOUTH 3 TIMES DAILY  30 tablet  3   ??? Multiple Vitamins-Minerals (MULTIVITAMIN PO) Take by mouth       ??? CALCIUM-VITAMIN D PO Take by mouth       ???  albuterol (PROVENTIL HFA) 108 (90 BASE) MCG/ACT inhaler Inhale 2 puffs into the lungs every 6 hours as needed for Wheezing  3 Inhaler  3   ??? metoclopramide (REGLAN) 5 MG tablet Take 1 tablet by mouth 4 times daily (before meals and nightly)  120 tablet  3   ??? sucralfate (CARAFATE) 1 GM tablet TAKE ONE TABLET BY MOUTH EVERY 6 HOURS  120 tablet  1   ??? hydrochlorothiazide (HYDRODIURIL) 25 MG tablet Take 1 tablet by mouth daily  90 tablet  3   ??? lisinopril (PRINIVIL;ZESTRIL) 10 MG tablet Take 1 tablet by mouth daily  90 tablet  3   ??? ALPRAZolam (XANAX) 0.5 MG tablet Take 1 tablet by mouth 2 times daily as needed for Anxiety  60 tablet  2   ??? montelukast (SINGULAIR) 10 MG tablet  TAKE ONE TABLET BY MOUTH DAILY  90 tablet  3   ??? nystatin (MYCOSTATIN) 100000 UNIT/GM powder Apply 3 times daily.  3 Bottle  5   ??? fluocinonide (LIDEX) 0.05 % cream Apply topically 2 times daily.  1 Tube  1   ??? acetaminophen 650 MG TABS Take 650 mg by mouth every 4 hours as needed.  120 tablet  3   ??? omeprazole (PRILOSEC) 40 MG capsule Take 1 capsule by mouth daily.  30 capsule  3   ??? cetirizine (ZYRTEC ALLERGY) 10 MG tablet Take 10 mg by mouth daily.         No current facility-administered medications on file prior to encounter.     Allergies:  Morphine and Talwin    History of allergic reaction to anesthesia:  No    Social History:   TOBACCO:   reports that she quit smoking about 1 years ago. Her smoking use included Cigarettes. She smoked 0.00 packs per day for 35 years. She has never used smokeless tobacco.  ETOH:   reports that she does not drink alcohol.  DRUGS:   reports that she does not use illicit drugs.  Family History:       Problem Relation Age of Onset   ??? Arthritis Mother    ??? Cancer Mother    ??? Depression Mother    ??? High Blood Pressure Mother    ??? Heart Disease Maternal Grandmother    ??? Early Death Paternal Grandmother    ??? Heart Disease Paternal Grandfather    ??? Diabetes Other    ??? High Blood Pressure Other    ??? Obesity Other     ??? Heart Disease Mother 67     MI    ??? Ovarian Cancer Mother 35   ??? Cancer Father 24     colon   ??? Cancer Mother        PHYSICAL EXAM:      BP 153/80 mmHg   Pulse 76   Temp(Src) 98.9 ??F (37.2 ??C) (Temporal)   Resp 16   Ht 5\' 6"  (1.676 m)   Wt 278 lb (126.1 kg)   BMI 44.89 kg/m2   SpO2 96% I        Heart:  Normal apical impulse, regular rate and rhythm, normal S1 and S2, no S3 or S4, and no murmur noted    Lungs:  No increased work of breathing, good air exchange, clear to auscultation bilaterally, no crackles or wheezing    Abdomen:  Normal bowel sounds, soft, non-distended, non-tender, no masses palpated, no hepatosplenomegally      ASA Grade:  ASA 3 - Patient with moderate systemic disease with functional limitations    Mallampati Class:  Class I: Soft palate, uvula, fauces, pillars visible  __________  Class II: Soft palate, uvula, fauces visible  ____X_____   Class III: Soft palate, base of uvula visible  __________  Class IV: Hard palate only visible   __________      ASSESSMENT AND PLAN:    1.  Patient is a 58 y.o. female here for EGD with anesthesia  2.  Procedure options, risks and benefits reviewed with patient.  Patient expresses understanding.     Norwood Levo, MD  [facility]  07/31/2014

## 2014-07-31 NOTE — Discharge Instructions (Signed)
ENDOSCOPY DISCHARGE INSTRUCTIONS     You may experience some lightheadedness for the next several hours.   Plan on quiet relaxation for the rest of today.   A responsible adult needs to stay with you today.   Because of the medications you received today-do not drive,operate machinery,or sign any contractual agreement for the next 24 hours.   Do not drink any alcoholic beverages or take any unprescribed medications tonight.   Eat bland food and avoid anything greasy or spicy initially-progress to your normal diet gradually.   Diet restrictions as instructed.   If you have any of the following problems, notify your physician or return to the hospital emergency room : fever, chills, excessive bleeding, excessive vomiting, difficulty swallowing, uncontrolled pain, increased abdominal distention, shortness of breath or any other problems.   If you have a sore throat, you may use lozenges or salt water gargles.   Call your doctor for  follow up appointment.Call Dr. Hassell Done for any problems and to schedule followup [phone].    CLEAR LIQUID DIET    Your doctor has prescribed a clear liquid diet for you. This temporary diet is often prescribed right before or after surgery or medical testing. The clear liquid diet is easy to digest and helps the body gradually get used to food again.    FOODS ON CLEAR LIQUID DIET INCLUDE:EXAMPLES    Water  Fruit juices without pulp, such as filtered juices or pulp free lemonade  Fruit punch or fruit drinks without pulp or pieces  Hot or cold coffee or tea (do not add milk or creamers of any type)  Sodas such as lemon-lime, cola, ginger ale  Sports drinks  Clear soup- bouillion  Plain or flavored gelatin (do not add fruits or toppings)  Frozen juice bars made from clear juices (no fruit pieces)  Popsicles    ANESTHESIA DISCHARGE INSTRUCTIONS     Wear your seatbelt home.   You are under the influence of drugs-do not drink alcohol,drive,operate machinery,or make any  important decisions or sign any legal documentsfor 24 hours   A responsible adult needs to be with you for 24 hours.   You may experience lightheadedness,dizziness,or sleepiness following surgery.   Rest at home today- increase activity as tolerated.   Progress slowly to a regular diet unless your physician has instructed you otherwise.Drink plenty of water.   If nausea becomes a problem call your physician.   Coughing,sore throat,and muscle aches are other side effects of anesthesia,and should improve with time.   Do not drive,operate machinery while taking narcotics.    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  3) Follow up with me in 2 - 3 months.

## 2014-07-31 NOTE — Progress Notes (Signed)
Received from or - drowsy,nasal cannula.respirations easy and regular,abdomen soft non tender,denies pain,vss

## 2014-08-02 MED FILL — LIDOCAINE HCL (CARDIAC) 20 MG/ML IV SOLN: 20 MG/ML | INTRAVENOUS | Qty: 5

## 2014-08-02 MED FILL — PROPOFOL 10 MG/ML IV EMUL: 10 MG/ML | INTRAVENOUS | Qty: 20

## 2014-08-21 MED ORDER — METAXALONE 800 MG PO TABS
800 MG | ORAL_TABLET | ORAL | Status: DC
Start: 2014-08-21 — End: 2014-09-24

## 2014-08-27 MED ORDER — LIOTHYRONINE SODIUM 25 MCG PO TABS
25 MCG | ORAL_TABLET | Freq: Every day | ORAL | Status: DC
Start: 2014-08-27 — End: 2015-01-12

## 2014-08-27 MED ORDER — ESCITALOPRAM OXALATE 20 MG PO TABS
20 MG | ORAL_TABLET | Freq: Every day | ORAL | Status: DC
Start: 2014-08-27 — End: 2015-06-03

## 2014-08-27 MED ORDER — LIOTHYRONINE SODIUM 25 MCG PO TABS
25 MCG | ORAL_TABLET | Freq: Every day | ORAL | Status: DC
Start: 2014-08-27 — End: 2014-08-27

## 2014-08-27 MED ORDER — ESCITALOPRAM OXALATE 20 MG PO TABS
20 MG | ORAL_TABLET | Freq: Every day | ORAL | Status: DC
Start: 2014-08-27 — End: 2014-08-27

## 2014-08-27 NOTE — Progress Notes (Signed)
Psychiatry Consultation  Loralie Champagne., M.D.  09/28/2014  5:19 AM      Time spent with Patient: 15 min    Controlled Substances Monitoring: Attestation: The Prescription Monitoring Report for this patient was reviewed today. Nori Riis Delsa Grana., MD)  Documentation: No signs of potential drug abuse or diversion identified. Nori Riis Delsa Grana., MD)    Reason for Consult:  depression  Referring Provider: Harvel Quale, [place] [address]    Recommendations:    ?? Continue current medications and follow    Diagnosis:    Axis I  Major depressive disorder; recurrent    Axis II  Coping Skills:  good    Axis III       Diagnosis Date   ??? Anxiety    ??? Asthma    ??? Chronic back pain    ??? Deep vein thrombosis    ??? Depression    ??? GERD (gastroesophageal reflux disease)      NO LONGER SINCE LAP   ??? Pulmonary embolism ([facility])    ??? Obesity      hx of; had lap band   ??? Hyperlipidemia      hx; resolved with lap band   ??? GERD (gastroesophageal reflux disease) 01/23/2009   ??? Hypertension         Axis IV  Other psychosocial and environmental problems    Axis V  61-70 mild symptoms    Examination:      Vital Signs BP 138/86 mmHg   Pulse 78   Wt 284 lb (128.822 kg)    Appearance    alert, cooperative, no distress  Speech    spontaneous, normal rate, normal volume and well articulated  Mood    euthymic  Affect    normal affect  Thought Content    intact  Thought Process    linear, goal directed and coherent  Associations    logical connections  Insight    Good  Judgment    Intact  No abnormal movements, tics or mannerisms.    Orientation    oriented to person, place, time, and general circumstances  Memory    recent and remote memory intact  Attention/Concentration    intact  Language    0 - no aphasia, normal  Fund of Knowledge    intact  Suicide Assessment    no suicidal ideation    Clinical Interview: Ms. Liverman returns with no complaints, good resolution of depression, no side  effects.    HPI:     Location: brain    Duration: chronic    Timing: continuous    Severity: in remission    Quality: good mood, energy, and focus    Assoc Sx: none    Context: good response to treatment    Modifying factors: none      History:      Past Medical History:    Past Medical History   Diagnosis Date   ??? Anxiety    ??? Asthma    ??? Chronic back pain    ??? Deep vein thrombosis    ??? Depression    ??? GERD (gastroesophageal reflux disease)      NO LONGER SINCE LAP   ??? Pulmonary embolism ([facility])    ??? Obesity      hx of; had lap band   ??? Hyperlipidemia      hx; resolved with lap band   ??? GERD (  gastroesophageal reflux disease) 01/23/2009   ??? Hypertension        Past Surgical History:    Past Surgical History   Procedure Laterality Date   ??? Colonoscopy     ??? Upper gastrointestinal endoscopy     ??? Cesarean section     ??? Cholecystectomy     ??? Hysterectomy     ??? Shoulder surgery     ??? Lap band  05/01/08     Dr. Nicole Cella   ??? Back surgery       neck  plates   ??? Colonoscopy  04/08/10   ??? Spine surgery     ??? Other surgical history       [place] Filter: curently in place   ??? Upper gastrointestinal endoscopy  05/19/13   ??? Other surgical history  07/05/13     lap band removal   ??? Upper gastrointestinal endoscopy  07/21/13     ESOPHAGEAL STENT PLACEMENT   ??? Upper gastrointestinal endoscopy  07/21/13     WITH EXCHANGE OF ESOPHAGEAL STENT   ??? Partial hysterectomy     ??? Upper gastrointestinal endoscopy N/A 07/31/2014     Esophagogastroduodenoscopy with esophageal balloon dilation       Medications:   Current Outpatient Prescriptions   Medication Sig Dispense Refill   ??? escitalopram (LEXAPRO) 20 MG tablet Take 1 tablet by mouth daily 30 tablet 6   ??? liothyronine (CYTOMEL) 25 MCG tablet Take 1 tablet by mouth daily 30 tablet 6   ??? Multiple Vitamins-Minerals (MULTIVITAMIN PO) Take by mouth     ??? CALCIUM-VITAMIN D PO Take by mouth     ??? albuterol (PROVENTIL HFA) 108 (90 BASE) MCG/ACT inhaler Inhale 2 puffs into the lungs every 6 hours as  needed for Wheezing 3 Inhaler 3   ??? metoclopramide (REGLAN) 5 MG tablet Take 1 tablet by mouth 4 times daily (before meals and nightly) 120 tablet 3   ??? sucralfate (CARAFATE) 1 GM tablet TAKE ONE TABLET BY MOUTH EVERY 6 HOURS 120 tablet 1   ??? hydrochlorothiazide (HYDRODIURIL) 25 MG tablet Take 1 tablet by mouth daily 90 tablet 3   ??? lisinopril (PRINIVIL;ZESTRIL) 10 MG tablet Take 1 tablet by mouth daily 90 tablet 3   ??? ALPRAZolam (XANAX) 0.5 MG tablet Take 1 tablet by mouth 2 times daily as needed for Anxiety 60 tablet 2   ??? montelukast (SINGULAIR) 10 MG tablet TAKE ONE TABLET BY MOUTH DAILY 90 tablet 3   ??? nystatin (MYCOSTATIN) 100000 UNIT/GM powder Apply 3 times daily. 3 Bottle 5   ??? fluocinonide (LIDEX) 0.05 % cream Apply topically 2 times daily. 1 Tube 1   ??? acetaminophen 650 MG TABS Take 650 mg by mouth every 4 hours as needed. 120 tablet 3   ??? omeprazole (PRILOSEC) 40 MG capsule Take 1 capsule by mouth daily. 30 capsule 3   ??? cetirizine (ZYRTEC ALLERGY) 10 MG tablet Take 10 mg by mouth daily.     ??? metaxalone (SKELAXIN) 800 MG tablet TAKE ONE TABLET BY MOUTH 3 TIMES DAILY 30 tablet 1     No current facility-administered medications for this visit.       Allergies:  Morphine and Talwin    Social History:   History     Social History   ??? Marital Status: Married     Spouse Name: N/A     Number of Children: N/A   ??? Years of Education: N/A     Occupational History   ???  Not on file.     Social History Main Topics   ??? Smoking status: Former Smoker -- 0.00 packs/day for 35 years     Types: Cigarettes     Quit date: 08/07/2012   ??? Smokeless tobacco: Never Used   ??? Alcohol Use: No   ??? Drug Use: No   ??? Sexual Activity: Not on file     Other Topics Concern   ??? Not on file     Social History Narrative    ** Merged History Encounter **            TOBACCO:   reports that she quit smoking about 2 years ago. Her smoking use included Cigarettes. She smoked 0.00 packs per day for 35 years. She has never used smokeless  tobacco.  ETOH:   reports that she does not drink alcohol.    Family History:   Family History   Problem Relation Age of Onset   ??? Arthritis Mother    ??? Cancer Mother    ??? Depression Mother    ??? High Blood Pressure Mother    ??? Heart Disease Maternal Grandmother    ??? Early Death Paternal Grandmother    ??? Heart Disease Paternal Grandfather    ??? Diabetes Other    ??? High Blood Pressure Other    ??? Obesity Other    ??? Heart Disease Mother 63     MI    ??? Ovarian Cancer Mother 37   ??? Cancer Father 38     colon   ??? Cancer Mother

## 2014-09-04 NOTE — Telephone Encounter (Signed)
I need to know if patient requested a refill on her grandson Adderall.  I left message for patient to call back.

## 2014-09-04 NOTE — Telephone Encounter (Signed)
Done

## 2014-09-24 MED ORDER — METAXALONE 800 MG PO TABS
800 MG | ORAL_TABLET | ORAL | Status: DC
Start: 2014-09-24 — End: 2014-11-28

## 2014-10-05 ENCOUNTER — Telehealth

## 2014-10-05 NOTE — Telephone Encounter (Signed)
Yes she needs a diagnostic mammogram

## 2014-10-05 NOTE — Telephone Encounter (Signed)
Please advise

## 2014-10-05 NOTE — Telephone Encounter (Signed)
Pt is experiencing discharge from the nipple and soreness in her right breast.  Pt went to [facility] to have Mammo, but was instructed to get order for diagnostic mammo from PCP.  Pt would like to pick the order up Tuesday morning.

## 2014-10-08 NOTE — Telephone Encounter (Signed)
Order ready at front

## 2014-10-18 NOTE — Telephone Encounter (Signed)
Per office advised patient to go to ER for symptoms she called in about (chest tightening, pain in arms, fatigue

## 2014-10-23 ENCOUNTER — Encounter

## 2014-10-23 ENCOUNTER — Telehealth

## 2014-10-23 ENCOUNTER — Inpatient Hospital Stay: Admit: 2014-10-23 | Attending: Family Medicine | Primary: Student in an Organized Health Care Education/Training Program

## 2014-10-23 ENCOUNTER — Inpatient Hospital Stay: Admit: 2014-10-23 | Primary: Student in an Organized Health Care Education/Training Program

## 2014-10-23 DIAGNOSIS — N6452 Nipple discharge: Secondary | ICD-10-CM

## 2014-10-23 NOTE — Telephone Encounter (Signed)
Pt is at [facility]  Order is wrong    Please fax new order for Bilateral Diagnostic Mammo  FAX:  [phone] (or input EPIC order)

## 2014-10-23 NOTE — Telephone Encounter (Signed)
Order placed in EPIC and faxed to [facility]. [phone]

## 2014-10-29 ENCOUNTER — Ambulatory Visit
Admit: 2014-10-29 | Discharge: 2014-10-29 | Payer: PRIVATE HEALTH INSURANCE | Attending: Surgery | Primary: Student in an Organized Health Care Education/Training Program

## 2014-10-29 DIAGNOSIS — N6452 Nipple discharge: Secondary | ICD-10-CM

## 2014-10-29 NOTE — Progress Notes (Addendum)
CC: right breast nipple discharge    HPI: Rachael Mays is a 58 y.o. woman who presents today with new right breast nipple discharge. She reports that since the end of September she has noticed that when she squeezes on her right nipple she discovers a new discharge. Discharge began as milky in appearance and has now become greenish in appearance. She did have one episode of a drop of blood. When she underwent diagnostic imaging there is also a green-appearing discharge that was noted. She reports that she can only identify this discharge when she squeezes on her nipple and never see spontaneously. She has never noticed anything from the left breast. She also reports that the discharge typically appears to be coming from multiple ducts. She states that she does perform routine self breast evaluations and has not noticed any new abnormalities such as masses, skin changes, color changes, or changes to the nipple-areolar complex. She has had an abnormal mammogram in 2004 when she underwent a left breast excisional biopsy for what resulted in benign disease. Since that point in time she has underwent annual mammography without any abnormalities detected. She has a family history of ovarian cancer in her mother who was diagnosed at age 61.      INTERVAL HISTORY:  On 10/23/2014 she underwent bilateral diagnostic breast imaging. There were no suspicious findings suggestive of malignancy bilaterally. Ultrasound of the right breast subareolar region showed no ductal dilation or intraductal lesions. Discharge was noted during examination which was green in appearance. BI-RADS 2.      Review of Systems   Constitutional: Positive for malaise/fatigue.   Eyes:        Occasional change in vision   Respiratory: Positive for shortness of breath.    Cardiovascular: Positive for chest pain and palpitations.   Gastrointestinal: Positive for vomiting.   Genitourinary: Positive for urgency and frequency.   Musculoskeletal: Positive for  myalgias and back pain.   Neurological: Positive for tingling and weakness.   Endo/Heme/Allergies: Positive for environmental allergies.   Psychiatric/Behavioral: Positive for depression. The patient is nervous/anxious.      The patient follows with her PCP for the majority of her medical ailments. She does report that she had a significant hospitalization a few months back when her lap band eroded through her stomach. She required hospitalization for greater than 2 months.      Past Medical History   Diagnosis Date   ??? Anxiety    ??? Asthma    ??? Chronic back pain    ??? Deep vein thrombosis    ??? Depression    ??? GERD (gastroesophageal reflux disease)      NO LONGER SINCE LAP   ??? Pulmonary embolism (HCC)    ??? Obesity      hx of; had lap band   ??? Hyperlipidemia      hx; resolved with lap band   ??? GERD (gastroesophageal reflux disease) 01/23/2009   ??? Hypertension        Past Surgical History   Procedure Laterality Date   ??? Colonoscopy     ??? Upper gastrointestinal endoscopy     ??? Cesarean section     ??? Cholecystectomy     ??? Hysterectomy     ??? Shoulder surgery     ??? Lap band  05/01/08     Dr. Nicole Cella   ??? Back surgery       neck  plates   ??? Colonoscopy  04/08/10   ??? Spine  surgery     ??? Other surgical history       [place] Filter: curently in place   ??? Upper gastrointestinal endoscopy  05/19/13   ??? Other surgical history  07/05/13     lap band removal   ??? Upper gastrointestinal endoscopy  07/21/13     ESOPHAGEAL STENT PLACEMENT   ??? Upper gastrointestinal endoscopy  07/21/13     WITH EXCHANGE OF ESOPHAGEAL STENT   ??? Partial hysterectomy     ??? Upper gastrointestinal endoscopy N/A 07/31/2014     Esophagogastroduodenoscopy with esophageal balloon dilation       Allergies as of 10/29/2014 - Review Complete 08/27/2014   Allergen Reaction Noted   ??? Morphine Itching 05/04/2014   ??? Talwin [pentazocine] Other (See Comments) 03/29/2013       History   Substance Use Topics   ??? Smoking status: Former Smoker -- 0.00 packs/day for 35 years      Types: Cigarettes     Quit date: 08/07/2012   ??? Smokeless tobacco: Never Used   ??? Alcohol Use: No         Family History:  Mother: Ovarian cancer diagnosed at age 34  Father: Colon cancer diagnosed at age 40  No additional family history of breast or ovarian cancer.    Hormonal History:  G2 P2 a.0  m.0  Menarche at age 64. First pregnancy at age 69. did not breastfeed.  Postmenopausal at age 28 with partial hysterectomy, ovaries intact  Hysterectomy: at age 27, ovaries intact  Denies estrogen supplementation  No OCP use    Medications:  Medication documentation has been reviewed in the electronic medical record and patient office intake form.        Exam:  There were no vitals taken for this visit.    Physical Exam      Constitutional: She appears well-nourished. No apparent distress.  Breast: The patient was examined in the upright and supine position.  She has a "D" cup breast. Breasts are symmetrically ptotic.        Right: There were no discrete masses or changes in breast contour.  There were no skin changes of the breast or nipple areolar complex.  There was no nipple inversion. On significant compression of the nipple there were 2 ducts with a drop of milky discharge.       Left: There were no discrete masses or changes in breast contour.  There were no skin changes of the breast or nipple areolar complex.  There was no nipple inversion or discharge.   There is no axillary lymphadenopathy palpated bilaterally.   Head: Normocephalic and atraumatic.   Eyes: EOM are normal. Pupils are equal, round, and reactive to light.   Neck: Neck supple. No tracheal deviation present. Cardiovascular: regular rate.  Extremities appear well perfused.  Pulmonary: respirations are non-labored and without audible distress  Lymphatics: no palpable axillary or cervical lymphadenopathy.  Skin: No rash noted. No erythema.   Neurologic: alert and oriented.        Assessment/Plan: Ms. Laumann is a 58 y.o. year-old woman unilateral right  nipple discharge    I reviewed with Ms. Smithhart her most recent breast imaging.    Nipple Discharge: We discussed that nipple discharge is most commonly associated with an underlying physiologic abnormality rather than pathology. Physiologic nipple discharge is usually bilateral, green/brown/milky in color and non-spontaneous. It can be caused by hormonal variations such as those that occur with pregnancy, the menstrual cycle or menopause.  It can also occur with medications. These reasons comprise about 90% of nipple discharge and are not concerning. Pathologic nipple discharge is usually unilateral, single duct, clear to bloody in nature, spontaneous and persistent.    Based on her description the etiology is more likely physiologic based on the characteristics she described. We reviewed the NCCN guidelines today during her visit. I reviewed with her abstaining from triggering the discharge. We also discussed the importance of a supportive bra. We discussed the importance of follow-up and I encouraged her to alert our office of any change in the discharge. We reviewed signs of pathologic discharge for which she should be more concerned, specifically if she should notice spontaneous discharge.    With her family history of ovarian cancer I will refer her to genetics.    I discussed with Ms. Doetsch that I could not guarantee that she did not have a breast cancer now nor could I guarantee she would not develop one in the future.     Self breast evaluation was reviewed.    We will plan to have her follow-up in the breast surgery office in approximately 3 months for a reevaluation. Her next annual screening mammography is November 2016.      All of the patient's questions were answered at this time however, she was encouraged to call the office with any further inquiries. Approximately 30 minutes of time were spent in this visit of which 50% or more of the time was related to coordination of care.

## 2014-10-29 NOTE — Telephone Encounter (Signed)
LOV 08-27-14  Follow up scheduled after Dr. Lillette Boxer last day

## 2014-10-29 NOTE — Telephone Encounter (Signed)
Out of Xanax - will be out of Cytomel on Sunday    Dr D's office has not returned her call about getting refill - is requesting TS fill until she can get another psychiatrist

## 2014-10-30 NOTE — Telephone Encounter (Signed)
Advised pt she must get refills of her medication thru Dr Beacher May.

## 2014-11-05 MED ORDER — ALPRAZOLAM 0.5 MG PO TABS
0.5 MG | ORAL_TABLET | Freq: Two times a day (BID) | ORAL | Status: DC | PRN
Start: 2014-11-05 — End: 2015-06-03

## 2014-11-05 NOTE — Telephone Encounter (Signed)
Could you call this in to her pharmacy?

## 2014-11-05 NOTE — Telephone Encounter (Signed)
Called in Rx to [facility] ([facility] Left message on machine notifying patient.

## 2014-11-07 NOTE — Telephone Encounter (Signed)
Ok for this?

## 2014-11-08 NOTE — Telephone Encounter (Signed)
Long time ago.  Needs appt.

## 2014-11-08 NOTE — Telephone Encounter (Signed)
No idea what she needs this for

## 2014-11-08 NOTE — Telephone Encounter (Signed)
Looks like Rachael Mays put patient on this for chest wall pain back in May, please advise.

## 2014-11-09 NOTE — Telephone Encounter (Signed)
Pt has an appt on 12/7, will discuss then

## 2014-11-12 ENCOUNTER — Ambulatory Visit
Admit: 2014-11-12 | Discharge: 2014-11-12 | Payer: PRIVATE HEALTH INSURANCE | Attending: Family Medicine | Primary: Student in an Organized Health Care Education/Training Program

## 2014-11-12 DIAGNOSIS — M791 Myalgia, unspecified site: Secondary | ICD-10-CM

## 2014-11-12 MED ORDER — NYSTATIN 100000 UNIT/GM EX POWD
100000 UNIT/GM | CUTANEOUS | Status: DC
Start: 2014-11-12 — End: 2015-04-07

## 2014-11-12 NOTE — Progress Notes (Signed)
Subjective:      Patient ID: Rachael Mays is a 58 y.o. female.    HPI    Everything hurts.  Symptoms x months-years.  Was walking at the [employer] last night and was really sore and exhausted afterward.  Feels old, sore, and tired.  No specific joint pain, just hurts all over.  No fever or chills.  No rash.      Chronic cough.  Has maintained smoke-free lifestyle x 2.5 years!  Cough a couple of times/day.  Non productive.  No hemoptysis.  No fever.      Review of Systems     Patient Active Problem List   Diagnosis   ??? Back Pain   ??? Deep Vein Thrombosis   ??? Depression   ??? High Cholesterol   ??? Hypertension   ??? Pulmonary Embolism   ??? Nausea & vomiting   ??? Abdominal  pain, other specified site   ??? Morbid obesity ([place])   ??? Status post gastric banding   ??? Vertigo   ??? Dizziness   ??? Tinnitus, subjective   ??? Meniere syndrome   ??? Complication of gastric banding   ??? Wound infection ([place])   ??? HTN (hypertension)   ??? Hyperglycemia   ??? Chest pain       Outpatient Prescriptions Marked as Taking for the 11/12/14 encounter (Office Visit) with Harvel Quale, MD   Medication Sig Dispense Refill   ??? nystatin (MYCOSTATIN) 100000 UNIT/GM powder Apply 3 times daily. 3 Bottle 5   ??? ALPRAZolam (XANAX) 0.5 MG tablet Take 1 tablet by mouth 2 times daily as needed for Anxiety 60 tablet 2   ??? metaxalone (SKELAXIN) 800 MG tablet TAKE ONE TABLET BY MOUTH 3 TIMES DAILY (Patient taking differently: TAKE ONE TABLET BY MOUTH 2 times daily) 30 tablet 1   ??? escitalopram (LEXAPRO) 20 MG tablet Take 1 tablet by mouth daily 30 tablet 6   ??? liothyronine (CYTOMEL) 25 MCG tablet Take 1 tablet by mouth daily 30 tablet 6   ??? Multiple Vitamins-Minerals (MULTIVITAMIN PO) Take by mouth     ??? CALCIUM-VITAMIN D PO Take by mouth     ??? albuterol (PROVENTIL HFA) 108 (90 BASE) MCG/ACT inhaler Inhale 2 puffs into the lungs every 6 hours as needed for Wheezing 3 Inhaler 3   ??? metoclopramide (REGLAN) 5 MG tablet Take 1 tablet by mouth 4 times  daily (before meals and nightly) 120 tablet 3   ??? hydrochlorothiazide (HYDRODIURIL) 25 MG tablet Take 1 tablet by mouth daily 90 tablet 3   ??? lisinopril (PRINIVIL;ZESTRIL) 10 MG tablet Take 1 tablet by mouth daily 90 tablet 3   ??? montelukast (SINGULAIR) 10 MG tablet TAKE ONE TABLET BY MOUTH DAILY 90 tablet 3   ??? fluocinonide (LIDEX) 0.05 % cream Apply topically 2 times daily. 1 Tube 1   ??? acetaminophen 650 MG TABS Take 650 mg by mouth every 4 hours as needed. 120 tablet 3   ??? omeprazole (PRILOSEC) 40 MG capsule Take 1 capsule by mouth daily. 30 capsule 3   ??? cetirizine (ZYRTEC ALLERGY) 10 MG tablet Take 10 mg by mouth daily.         Allergies   Allergen Reactions   ??? Morphine Itching   ??? Talwin [Pentazocine] Other (See Comments)     dizzy       History   Substance Use Topics   ??? Smoking status: Former Smoker -- 0.00 packs/day for 35  years     Types: Cigarettes     Quit date: 08/07/2012   ??? Smokeless tobacco: Never Used   ??? Alcohol Use: No     Objective:   Physical Exam   Constitutional: She is oriented to person, place, and time. She appears well-developed and well-nourished. No distress.   Cardiovascular: Normal rate, regular rhythm and normal heart sounds.    Pulmonary/Chest: Effort normal and breath sounds normal. She has no wheezes. She has no rales.   Neurological: She is alert and oriented to person, place, and time.   Psychiatric: She has a normal mood and affect. Her behavior is normal.     BP 124/82 mmHg   Pulse 72   Wt 290 lb 9.6 oz (131.815 kg)   SpO2 98%    Assessment:     1. Muscle pain     2. Dermatitis  nystatin (MYCOSTATIN) 100000 UNIT/GM powder   3. Dyspnea, unspecified dyspnea        Plan:     Low dose CT chest not indicated as she had CT chest/abdomen/pelvis 07/2013.  Check PFTs.  Dr. Oneal Deputy referral for muscle pain.

## 2014-11-12 NOTE — Progress Notes (Signed)
Patient is here for a follow-up on her medications. Patient is having issues with fatigue, and not sleeping.  She feels like her legs are going to hold her up.

## 2014-11-13 ENCOUNTER — Telehealth

## 2014-11-13 NOTE — Telephone Encounter (Signed)
Faxed referral

## 2014-11-13 NOTE — Telephone Encounter (Signed)
Pt states a referral for Dr Oneal Deputy for muscle pain was to have been faxed yesterday to their office - states she called today and they have not received anything    Please fax the referral to:  [phone]

## 2014-11-15 NOTE — Telephone Encounter (Signed)
-----   Message from [place]., MD sent at 11/13/2014  8:29 AM EST -----  Indefinitely, but I would track the TSH, t3, and t4 over time.  What you may notice is that the t4 will be low and the t3 will be high (because we are adding t3 and it will tell the pituitary to slow down the t4 production) and some people will have an asymptomatic suppression of TSH.  If the hormone levels, particularly the TSH, start heading too far outside the reference ranges or the patient becomes symptomatic for hyperthyroidism, I would back off of it.      ----- Message -----     From: Harvel Quale, MD     Sent: 11/12/2014   3:46 PM       To: Carla Drape., MD    Hi, Dr. Venetia Constable,    Saw Rachael Mays today.  Just wondering how long patient should stay on cytomel?  Do we need to taper off at some point.    Thanks!  Tess

## 2014-11-20 NOTE — Telephone Encounter (Signed)
Records in epic.

## 2014-11-20 NOTE — Telephone Encounter (Addendum)
Referral from Dr Maurilio Lovely ---called pt made NP appt for 1/25--consult requsted for muscle pain. Records should be in epic.

## 2014-11-21 ENCOUNTER — Inpatient Hospital Stay: Admit: 2014-11-21 | Attending: Family Medicine | Primary: Student in an Organized Health Care Education/Training Program

## 2014-11-21 MED ORDER — ALBUTEROL SULFATE HFA 108 (90 BASE) MCG/ACT IN AERS
108 (90 Base) MCG/ACT | Freq: Once | RESPIRATORY_TRACT | Status: AC
Start: 2014-11-21 — End: 2014-11-21
  Administered 2014-11-21: 23:00:00 4 via RESPIRATORY_TRACT

## 2014-11-22 NOTE — Procedures (Signed)
PATIENT NAME:                   PA #:             MR #Rachael Mays, Rachael Mays                   [id number]        [id number]         ATTENDING PHYSICIAN:                     DATE:        DIS DATE:       Hyman Hopes SLOUGH, MD                 11/21/2014   11/21/2014      DATE OF BIRTH:     AGE:            PATIENT TYPE:      RM #:           1956/07/14         58              OPF                                   INDICATION:  Dyspnea.       INTERPRETATION:  Spirometry showed normal FVC of 2.83 L, 85% of predicted,  and normal FEV1 of 2.23 L, 83% of predicted.  FEV1/FVC ratio was normal.  No  response to bronchodilators demonstrated on spirometry.  Lung volumes showed  normal total lung capacity of 4.2%, 79% of predicted.  Diffusion capacity  showed decreased DLCO of 71% of predicted.       IMPRESSION:  No obstructive defect on spirometry with no response to  bronchodilators.    Normal lung volumes and mild decrease in diffusion capacity.                                            Lacretia Leigh, MD     [id number]  DD: 11/22/2014 07:55   DT: 11/22/2014 08:50   Job #: [id number]  CC: Hyman Hopes SLOUGH, MD

## 2014-11-26 ENCOUNTER — Encounter: Attending: Forensic Psychiatry | Primary: Student in an Organized Health Care Education/Training Program

## 2014-11-28 MED ORDER — METAXALONE 800 MG PO TABS
800 MG | ORAL_TABLET | Freq: Three times a day (TID) | ORAL | Status: DC
Start: 2014-11-28 — End: 2015-02-26

## 2014-11-28 MED ORDER — METAXALONE 800 MG PO TABS
800 MG | ORAL_TABLET | Freq: Three times a day (TID) | ORAL | Status: DC
Start: 2014-11-28 — End: 2014-11-28

## 2014-11-28 NOTE — Telephone Encounter (Signed)
Please advise, looked at last office note and Dr. Murrell Converse did order this test.  Please advise.

## 2014-11-28 NOTE — Telephone Encounter (Signed)
lmtc

## 2014-11-28 NOTE — Telephone Encounter (Signed)
overall normal spirometry, there is a decrease in lung volumes and mild decrease in diffusion capacity   the decreased lung volumes comes from being overweight  Taking a breathing treatment did not help

## 2014-11-28 NOTE — Telephone Encounter (Signed)
Pt states TS ordered this test but has Dr Jeb Levering showing as provider - pt states she does not know who this Dr is and is requesting a call back from this office for the results    Please call pt and advise

## 2014-12-13 ENCOUNTER — Encounter

## 2014-12-13 NOTE — Telephone Encounter (Signed)
Advised pt of PFT result, which per Dr Hassie Bruce overall look s"OK" shows some decrease in lung volume and capacity. This issue stems from pt being overweight.

## 2014-12-13 NOTE — Care Coordination-Inpatient (Signed)
Received call from patient.  states "I'm ready to get on the bandwagon!"  She would like to meet with dietician to improve her diet, lose weight.  Review of labs shows she is probably pre-diabetic.  She would also like to prevent full-blown diabetes.   Sent referral to RD, Earney Hamburg per patient request.

## 2014-12-14 NOTE — Care Coordination-Inpatient (Signed)
TC to pt d/t RD needs to reschedule 01/01/14 appt.  No answer, left VM requesting return call.    Earney Hamburg, [place], LD  Care Coordination Team Dietitian  [phone]

## 2014-12-14 NOTE — Telephone Encounter (Signed)
Referral received and confirming appt for 01/01/15 at 3p at PCP office.    Earney Hamburg, [place], LD  Care Coordination Team Dietitian  [phone]

## 2014-12-19 NOTE — Telephone Encounter (Signed)
This CCSS will make reminder call to pt on 12/28/2014.

## 2014-12-19 NOTE — Care Coordination-Inpatient (Signed)
Repeat TC placed to pt to reschedule 1:1 with Care Coord RD.  Appt rescheduled for Monday 12/31/14 at 3p at PCP office. Pt requests reminder call; will request [place] [facility] reminder call on Friday 12/28/14.    Earney Hamburg, [place], LD  Care Coordination Team Dietitian  [phone]

## 2014-12-24 ENCOUNTER — Ambulatory Visit
Admit: 2014-12-24 | Discharge: 2014-12-24 | Payer: PRIVATE HEALTH INSURANCE | Attending: Physician Assistant | Primary: Student in an Organized Health Care Education/Training Program

## 2014-12-24 ENCOUNTER — Encounter

## 2014-12-24 DIAGNOSIS — J069 Acute upper respiratory infection, unspecified: Secondary | ICD-10-CM

## 2014-12-24 MED ORDER — GUAIFENESIN-CODEINE 100-10 MG/5ML PO SYRP
100-10 MG/5ML | ORAL | Status: AC | PRN
Start: 2014-12-24 — End: 2014-12-31

## 2014-12-24 MED ORDER — BENZONATATE 200 MG PO CAPS
200 MG | ORAL_CAPSULE | Freq: Three times a day (TID) | ORAL | Status: AC | PRN
Start: 2014-12-24 — End: 2015-01-03

## 2014-12-24 MED ORDER — AZITHROMYCIN 250 MG PO TABS
250 MG | PACK | ORAL | Status: AC
Start: 2014-12-24 — End: 2015-01-03

## 2014-12-24 MED ORDER — LORCASERIN HCL 10 MG PO TABS
10 MG | ORAL_TABLET | Freq: Every day | ORAL | Status: DC
Start: 2014-12-24 — End: 2015-01-12

## 2014-12-24 NOTE — Progress Notes (Signed)
Subjective:      Patient ID: Rachael Mays is a 59 y.o. female.    HPI  Patient c/o a productive cough, nasal congestion, sore throat, general body aches.  Symptoms started about a week ago.   Has felt feverish, but not checked her temperature. No n/v but has had diarrhea. Her ribs and back hurt from coughing. She has taken dayquil and nyquil.  Review of Systems   Constitutional: Positive for fatigue. Negative for fever, chills and appetite change.   HENT: Positive for congestion, postnasal drip and sore throat. Negative for ear pain, rhinorrhea, sinus pressure and trouble swallowing.    Respiratory: Positive for cough and shortness of breath. Negative for wheezing.    Gastrointestinal: Negative for nausea, vomiting and diarrhea.   Skin: Negative for rash.   Neurological: Negative for dizziness and headaches.   Hematological: Negative for adenopathy.   Psychiatric/Behavioral: Negative for sleep disturbance.       Objective:   Physical Exam   Constitutional: She is oriented to person, place, and time. Vital signs are normal. She appears well-developed and well-nourished. She is cooperative.   HENT:   Head: Normocephalic.   Right Ear: Tympanic membrane and ear canal normal.   Left Ear: Tympanic membrane and ear canal normal.   Nose: Mucosal edema present. Right sinus exhibits no maxillary sinus tenderness and no frontal sinus tenderness. Left sinus exhibits no maxillary sinus tenderness and no frontal sinus tenderness.   Mouth/Throat: Oropharynx is clear and moist and mucous membranes are normal.   Neck: Neck supple.   Cardiovascular: Normal rate, regular rhythm and normal heart sounds.    Pulmonary/Chest: Effort normal and breath sounds normal. No respiratory distress. She has no decreased breath sounds.   Lymphadenopathy:     She has no cervical adenopathy.   Neurological: She is alert and oriented to person, place, and time.       Assessment:      Rachael Mays was seen today for uri.    Diagnoses and associated orders  for this visit:    Upper respiratory tract infection, unspecified type  - azithromycin (ZITHROMAX) 250 MG tablet; Take 2 tabs (500 mg) on Day 1, and take 1 tab (250 mg) on days 2 through 5.  - guaiFENesin-codeine (CHERATUSSIN AC) 100-10 MG/5ML syrup; Take 5 mLs by mouth every 4 hours as needed for Cough  - benzonatate (TESSALON) 200 MG capsule; Take 1 capsule by mouth 3 times daily as needed for Cough               Plan:      Call if not resolving.

## 2014-12-24 NOTE — Progress Notes (Signed)
I have reviewed the history and physical note and findings.

## 2014-12-24 NOTE — Patient Instructions (Signed)
Rachael Mays was seen today for uri.    Diagnoses and associated orders for this visit:    Upper respiratory tract infection, unspecified type  - azithromycin (ZITHROMAX) 250 MG tablet; Take 2 tabs (500 mg) on Day 1, and take 1 tab (250 mg) on days 2 through 5.  - guaiFENesin-codeine (CHERATUSSIN AC) 100-10 MG/5ML syrup; Take 5 mLs by mouth every 4 hours as needed for Cough  - benzonatate (TESSALON) 200 MG capsule; Take 1 capsule by mouth 3 times daily as needed for Cough         Call if not resolving.

## 2014-12-25 NOTE — Care Coordination-Inpatient (Signed)
Met with patient in office.  Here today for URI.  Patient is looking forward to meeting with dietician on 1/25.  She states she knows she needs help on shopping and making wiser choices about the food that is in her pantry.  States she is willing to do the work necessary to improve her health.  No other needs at this time.

## 2014-12-28 MED ORDER — LEVOFLOXACIN 500 MG PO TABS
500 MG | ORAL_TABLET | Freq: Every day | ORAL | Status: AC
Start: 2014-12-28 — End: 2015-01-07

## 2014-12-28 NOTE — Telephone Encounter (Signed)
Please advise

## 2014-12-28 NOTE — Telephone Encounter (Signed)
levaquin 500mg, 1 po daily x 10 days, #10, NR

## 2014-12-28 NOTE — Telephone Encounter (Signed)
Patient notified.  rx sent to the pharmacy.

## 2014-12-28 NOTE — Telephone Encounter (Signed)
This CCSS LVM with patient reminding her of her appt with Caitlyn Troklus CCR[facility] on 12/31/2014 at 3pm at Dr. Gonce Mince office.

## 2014-12-28 NOTE — Telephone Encounter (Signed)
Pt states she has finished her zpac and she still feels the same as before she started taking it     Please advise next steps

## 2014-12-31 ENCOUNTER — Encounter: Attending: Rheumatology | Primary: Student in an Organized Health Care Education/Training Program

## 2015-01-02 MED ORDER — METAXALONE 800 MG PO TABS
800 MG | ORAL_TABLET | ORAL | Status: DC
Start: 2015-01-02 — End: 2015-09-18

## 2015-01-03 NOTE — Care Coordination-Inpatient (Signed)
Registered Dietitian Initial Assessment for [place]  December 31, 2014    Initial Referral Reason: Referral received from [facility] for nutrition counseling and education r/t implementation of a reduced kcal diet to promote wt loss and improve pre-DM state.     Patient Care Team:  Harvel Quale, MD as PCP - General  Leodis Binet, MD as Consulting Physician (Cardiology)  Sherrian Divers, [place] (Physician Assistant)  Alwyn Ren, MD (Bariatrics)  Denice Paradise, MD as Consulting Physician (Otolaryngology)  Arlice Colt, RN as Case Manager (Care Coordinator)    Patient Active Problem List   Diagnosis   ??? Back Pain   ??? Deep Vein Thrombosis   ??? Depression   ??? High Cholesterol   ??? Hypertension   ??? Pulmonary Embolism   ??? Nausea & vomiting   ??? Abdominal  pain, other specified site   ??? Morbid obesity ([place])   ??? Status post gastric banding   ??? Vertigo   ??? Dizziness   ??? Tinnitus, subjective   ??? Meniere syndrome   ??? Complication of gastric banding   ??? Wound infection ([place])   ??? HTN (hypertension)   ??? Hyperglycemia   ??? Chest pain       Current Outpatient Prescriptions   Medication Sig Dispense Refill   ??? metaxalone (SKELAXIN) 800 MG tablet TAKE ONE TABLET BY MOUTH THREE TIMES A DAY 30 tablet 1   ??? levofloxacin (LEVAQUIN) 500 MG tablet Take 1 tablet by mouth daily for 10 days 10 tablet 0   ??? azithromycin (ZITHROMAX) 250 MG tablet Take 2 tabs (500 mg) on Day 1, and take 1 tab (250 mg) on days 2 through 5. 1 packet 0   ??? benzonatate (TESSALON) 200 MG capsule Take 1 capsule by mouth 3 times daily as needed for Cough 30 capsule 1   ??? Lorcaserin HCl (BELVIQ) 10 MG TABS Take 10 mg by mouth daily 30 tablet 2   ??? metaxalone (SKELAXIN) 800 MG tablet Take 1 tablet by mouth 3 times daily 30 tablet 0   ??? nystatin (MYCOSTATIN) 100000 UNIT/GM powder Apply 3 times daily. 3 Bottle 5   ??? ALPRAZolam (XANAX) 0.5 MG tablet Take 1 tablet by mouth 2 times daily as needed for Anxiety 60 tablet 2    ??? escitalopram (LEXAPRO) 20 MG tablet Take 1 tablet by mouth daily 30 tablet 6   ??? liothyronine (CYTOMEL) 25 MCG tablet Take 1 tablet by mouth daily 30 tablet 6   ??? Multiple Vitamins-Minerals (MULTIVITAMIN PO) Take by mouth     ??? CALCIUM-VITAMIN D PO Take by mouth     ??? albuterol (PROVENTIL HFA) 108 (90 BASE) MCG/ACT inhaler Inhale 2 puffs into the lungs every 6 hours as needed for Wheezing 3 Inhaler 3   ??? metoclopramide (REGLAN) 5 MG tablet Take 1 tablet by mouth 4 times daily (before meals and nightly) 120 tablet 3   ??? hydrochlorothiazide (HYDRODIURIL) 25 MG tablet Take 1 tablet by mouth daily 90 tablet 3   ??? lisinopril (PRINIVIL;ZESTRIL) 10 MG tablet Take 1 tablet by mouth daily 90 tablet 3   ??? montelukast (SINGULAIR) 10 MG tablet TAKE ONE TABLET BY MOUTH DAILY 90 tablet 3   ??? fluocinonide (LIDEX) 0.05 % cream Apply topically 2 times daily. 1 Tube 1   ??? acetaminophen 650 MG TABS Take 650 mg by mouth every 4 hours as needed. 120 tablet 3   ??? omeprazole (  PRILOSEC) 40 MG capsule Take 1 capsule by mouth daily. 30 capsule 3   ??? cetirizine (ZYRTEC ALLERGY) 10 MG tablet Take 10 mg by mouth daily.       No current facility-administered medications for this visit.           NUTRITION ASSESSMENT    Biochemical Data, Medical Tests and Procedures:    Lab Results   Component Value Date    LABA1C 5.2 03/26/2010     Lab Results   Component Value Date    EAG 102.5 03/26/2010       Lab Results   Component Value Date    CHOL 208* 05/04/2014    CHOL 217* 06/30/2013    CHOL 195 03/16/2012     Lab Results   Component Value Date    TRIG 137 05/04/2014    TRIG 153* 08/21/2013    TRIG 102 07/24/2013     Lab Results   Component Value Date    HDL 49 05/04/2014    HDL 60 06/30/2013    HDL 62* 03/16/2012     Lab Results   Component Value Date    LDLCALC 132* 05/04/2014    LDLCALC 138* 06/30/2013    LDLCALC 115* 03/16/2012     Lab Results   Component Value Date    LABVLDL 27 05/04/2014    LABVLDL 19 06/30/2013    LABVLDL 18 03/16/2012      No results found for: CHOLHDLRATIO    Lab Results   Component Value Date    WBC 7.6 07/16/2014    HGB 14.1 07/16/2014    HCT 44.1 07/16/2014    MCV 84.8 07/16/2014    PLT 192 07/16/2014       Lab Results   Component Value Date    CREATININE 0.8 07/16/2014    BUN 15 07/16/2014    NA 140 07/16/2014    K 4.0 07/16/2014    CL 104 07/16/2014    CO2 24 07/16/2014         Anthropometric Measurements:    Ht: 5'6" (167.64 cm)  Wt: 287# (130.5 kg)  BMI: 46.35  IBW: 130# +/-10%  % IBW: 221 %  AdBW: 169.25# (76.9 kg)      Physical Exam Findings:    Deferred      Food and Nutrition History:   -Pt lives at home w/ spouse, dtr & dtr's partner, grandson.   -Grocery shopping and meal prep duties: unable to determine, but pt seems to prep some meals.  -Pt starting Belvique to encourage wt loss  -Usual meals & beverages: unable to determine  -Poor sleep patterns  -Works for [employer]      Client History: Naraya is a 59 y.o. female; OV today for nutrition counseling & education r/t implementation of a reduced kcal, consistent CHO diet to promote wt loss and improved glycemic control r/t pt labs suggestive of pre-diabetes.     Gathered relevant information r/t pt's past attempts at wt loss which include [employer], [employer], a lap-band removed r/t complications of gastric banding (band removed 07/05/13; pt reports 120# decrease in wt w/ band & 80# regain; noted pt wt at band removal= 236#).  Pt reports chronic pain with undiagnosed etiology.  Pt is tearful at times when she describes her pain level & frustration with wt management.    Reviewed concept r/t physiology of wt loss and attempted to have pt state nutrition behaviors that promoted wt loss with lap-band.  Pt  cannot state any positive nutrition behaviors and states she believes wt loss was due to her regular exercise.  Pt seems to harbour harmful beliefs about food as she identifies several foods/food groups as causing wt gain (bread, carbs) instead of  acknowledging the impact of net kcals and benefit of consuming a balanced, healthful reduced kcal intake.   States she "does not like the right foods" to encourage wt loss.  States her excess wt was caused by administration of steroid medication.  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      Pt would likely benefit from nutrition education regarding a balanced, healthful intake & close review of current intake to encourage discussion about various foods/food groups.  She is open to recording her intake for ~3 days and returning to office for collaborative review with RD.  F/u is scheduled for next week.       Educational Resources provided:  None.     Notes for future nutrition education/counseling: Incorporate ME exercises as appropriate.            NUTRITION DIAGNOSIS    #1 Problem  Overweight/Obesity       Etiology  related to excess energy intake and decreased energy expenditure       Signs/Symptoms  as evidenced by BMI= 46.35.     #2 Problem  Harmful beliefs/attitudes about food or nutrition-related topics       Etiology  related to possible lack of exposure to accurate nutrition-related information       Signs/Symptoms  as evidenced by pt attribution of excess wt gain to consumption of particular foods/food groups &  steroid medication regimen.       NUTRITION INTERVENTION  Nutrition Prescription:    Consistent CHO, heart healthy, reduced kcal diet providing 1697 kcals (13 kcals/kg ) to lose weight; goal for rate of weight loss: not established.    Estimated daily CHO Needs: 191 gm (@45 % kcals from CHO choices)  Estimated daily Protein Needs: 104 gm (based on 0.8 gm/kg)  Estimated daily Fluid Needs: 1623mls (57 oz)  (based on RDA method of 1 ml/kcal)    Intervention #1  Nutrition Education: Clearly defined the benefits of nutrition therapy.  Summarized and affirmed positive aspects of current nutrition patterns.  Provided  introductory education regarding basic principles of a reduced kcal, healthful & balanced intake.  Explored ideas for small, incremental goals to initiate behavior change.      Goal(s) Pt will apply education to define small goals that will help to implement reduced kcal nutrition therapy.       Intervention #2  Nutrition Counseling: Used open-ended questions to assess pt's perceived susceptibility and/or severity of disease state. Discussed potential impact of health threat on pt's lifestyle.  Used open-ended questions to assess pt's perception regarding benefits of and barriers to implementation of nutrition therapy.    Goal(s) Pt will apply constructs discussed during nutrition counseling to initiate or encourage behavior change to positively affect disease state/nutrition status.          NUTRITION MONITORING AND EVALUATION  Indicator Criteria   #1  Continuing nutrition education and counseling  #1 Pt will keep f/u appt with RD   #2   #2    #3   #3      Follow Up: F/u appt w/ RD scheduled for 01/07/15 at 1pm; [facility] aware.     Sierra Spargo MS,  RD, LD  Registered Dietitian, Care Coordination Team

## 2015-01-07 NOTE — Care Coordination-Inpatient (Signed)
Received call from patient.  Very apologetic.  States her daughter needed to use her car. States she called the main office line. This sent her to central scheduling and they could not assist her with cancelling appt with [facility] and dietician.  Advised patient that central scheduling does not come to this office so they do not know CC team  Gave patient contact information for [facility] and re-scheduled appt with dietician.

## 2015-01-09 MED ORDER — METOCLOPRAMIDE HCL 5 MG PO TABS
5 MG | ORAL_TABLET | Freq: Four times a day (QID) | ORAL | Status: DC
Start: 2015-01-09 — End: 2015-03-17

## 2015-01-09 NOTE — Telephone Encounter (Signed)
Noted and confirming rescheduled appt.  Thank you Tammie!    Earney Hamburg, [place], LD  Care Coordination Team Dietitian  [phone]

## 2015-01-11 ENCOUNTER — Emergency Department: Admit: 2015-01-12 | Primary: Student in an Organized Health Care Education/Training Program

## 2015-01-11 NOTE — Care Coordination-Inpatient (Signed)
Received call from patient.  States she is in pain, very dizzy, especially at HS, having chest tightness.  States pain radiates upward from her lower back.  She is tearful.  States "I know something is wrong with my body but I feel like no one is listening!".  Is very willing to see any specialist to try to figure out her s/s.  Sent message to PCP and PA who has also seen patient.

## 2015-01-11 NOTE — Care Coordination-Inpatient (Signed)
Call pt- per AJ  Pt was advised to come in to see TS   Pt wants to go to the ER  Says if the pain isn't as severe tonight then she will call tomorrow for an appt    FYI

## 2015-01-11 NOTE — ED Provider Notes (Signed)
[facility]  eMERGENCY dEPARTMENT eNCOUnter        Pt Name: Rachael Mays  MRN: [id number]  [place] 1956/05/22  Date of evaluation: 01/11/2015  Provider: Karl Pock, DO  PCP: Acquanetta Sit SLOUGH, MD      CHIEF COMPLAINT       Chief Complaint   Patient presents with   ??? Shortness of Breath     pt presents with c/o SOB and dizziness for past couple days.  pt states hx of PEs.  pt states she talked to PCP but today she having heaviness in chest.       HISTORY OF PRESENT ILLNESS   (Location/Symptom, Timing/Onset, Context/Setting, Quality, Duration, Modifying Factors, Severity)  Note limiting factors.     Rachael Mays is a 59 y.o. female  with numerous medical problems including remote PE and DVT cigarettes or oral contraceptive use as well as postoperative state, as well as a history of anxiety, depression, hypertension, high cholesterol, asthma without any known history of cardiac disease who presented today because of chest tightness and pressure.  She states she had a stress test on a treadmill couple years ago which was normal, does have a history of cardiac disease in her mother, she is a former smoker.  Rule out chronic rib pain for the past couple years, this is not new, however she is now concerned about shortness of breath and chest tightness which as been there for 2 days on and off.  If getting worse.  She's never had this pain before the past.  Denies it any injury or trauma.  States that she is having some tightness and pressure to her chest trauma to both shoulders but not the back area.  It does make her short of breath, specifically when she climbs up steps.  If more tightness and pressure, mild to moderate in intensity associated with nausea, dizziness and occasional diaphoresis.  She is not passing out, she is not vomiting.  Denies eating or drinking or cough indicative throat.  Denies any weight gain, swelling, unilateral calf tenderness or other risk factor at this point time  for PE or DVT.  She offers no other complaints at this time.  Otherwise been well in her usual state of health before this happened    Nursing Notes were all reviewed and agreed with or any disagreements were addressed  in the HPI.    REVIEW OF SYSTEMS    (2-9 systems for level 4, 10 or more for level 5)     Review of Systems    Except as noted above in the ROS, all other systems were reviewed and negative.       PAST MEDICAL HISTORY     Past Medical History   Diagnosis Date   ??? Anxiety    ??? Asthma    ??? Chronic back pain    ??? Deep vein thrombosis    ??? Depression    ??? GERD (gastroesophageal reflux disease)      NO LONGER SINCE LAP   ??? Pulmonary embolism (HCC)    ??? Obesity      hx of; had lap band   ??? Hyperlipidemia      hx; resolved with lap band   ??? GERD (gastroesophageal reflux disease) 01/23/2009   ??? Hypertension          SURGICAL HISTORY       Past Surgical History   Procedure Laterality Date   ??? Colonoscopy     ???  Upper gastrointestinal endoscopy     ??? Cesarean section     ??? Cholecystectomy     ??? Hysterectomy     ??? Shoulder surgery     ??? Lap band  05/01/08     Dr. Nicole Cella   ??? Back surgery       neck  plates   ??? Colonoscopy  04/08/10   ??? Spine surgery     ??? Other surgical history       [place] Filter: curently in place   ??? Upper gastrointestinal endoscopy  05/19/13   ??? Other surgical history  07/05/13     lap band removal   ??? Upper gastrointestinal endoscopy  07/21/13     ESOPHAGEAL STENT PLACEMENT   ??? Upper gastrointestinal endoscopy  07/21/13     WITH EXCHANGE OF ESOPHAGEAL STENT   ??? Partial hysterectomy     ??? Upper gastrointestinal endoscopy N/A 07/31/2014     Esophagogastroduodenoscopy with esophageal balloon dilation         CURRENT MEDICATIONS       Current Discharge Medication List      CONTINUE these medications which have NOT CHANGED    Details   metoclopramide (REGLAN) 5 MG tablet Take 1 tablet by mouth 4 times daily (before meals and nightly)  Qty: 120 tablet, Refills: 0      !! metaxalone (SKELAXIN) 800 MG  tablet TAKE ONE TABLET BY MOUTH THREE TIMES A DAY  Qty: 30 tablet, Refills: 1      !! metaxalone (SKELAXIN) 800 MG tablet Take 1 tablet by mouth 3 times daily  Qty: 30 tablet, Refills: 0      nystatin (MYCOSTATIN) 100000 UNIT/GM powder Apply 3 times daily.  Qty: 3 Bottle, Refills: 5    Associated Diagnoses: Dermatitis      ALPRAZolam (XANAX) 0.5 MG tablet Take 1 tablet by mouth 2 times daily as needed for Anxiety  Qty: 60 tablet, Refills: 2      escitalopram (LEXAPRO) 20 MG tablet Take 1 tablet by mouth daily  Qty: 30 tablet, Refills: 6      Multiple Vitamins-Minerals (MULTIVITAMIN PO) Take by mouth      CALCIUM-VITAMIN D PO Take by mouth      albuterol (PROVENTIL HFA) 108 (90 BASE) MCG/ACT inhaler Inhale 2 puffs into the lungs every 6 hours as needed for Wheezing  Qty: 3 Inhaler, Refills: 3      hydrochlorothiazide (HYDRODIURIL) 25 MG tablet Take 1 tablet by mouth daily  Qty: 90 tablet, Refills: 3      lisinopril (PRINIVIL;ZESTRIL) 10 MG tablet Take 1 tablet by mouth daily  Qty: 90 tablet, Refills: 3      montelukast (SINGULAIR) 10 MG tablet TAKE ONE TABLET BY MOUTH DAILY  Qty: 90 tablet, Refills: 3      fluocinonide (LIDEX) 0.05 % cream Apply topically 2 times daily.  Qty: 1 Tube, Refills: 1    Associated Diagnoses: Eczema      omeprazole (PRILOSEC) 40 MG capsule Take 1 capsule by mouth daily.  Qty: 30 capsule, Refills: 3      cetirizine (ZYRTEC ALLERGY) 10 MG tablet Take 10 mg by mouth daily.      acetaminophen 650 MG TABS Take 650 mg by mouth every 4 hours as needed.  Qty: 120 tablet, Refills: 3       !! - Potential duplicate medications found. Please discuss with provider.          ALLERGIES     Morphine and  Talwin    FAMILY HISTORY       Family History   Problem Relation Age of Onset   ??? Arthritis Mother    ??? Cancer Mother    ??? Depression Mother    ??? High Blood Pressure Mother    ??? Heart Disease Maternal Grandmother    ??? Early Death Paternal Grandmother    ??? Heart Disease Paternal Grandfather    ??? Diabetes  Other    ??? High Blood Pressure Other    ??? Obesity Other    ??? Heart Disease Mother 63     MI    ??? Ovarian Cancer Mother 56   ??? Cancer Father 110     colon   ??? Cancer Mother           SOCIAL HISTORY       History     Social History   ??? Marital Status: Married     Spouse Name: N/A     Number of Children: N/A   ??? Years of Education: N/A     Social History Main Topics   ??? Smoking status: Former Smoker -- 0.00 packs/day for 35 years     Types: Cigarettes     Quit date: 08/07/2012   ??? Smokeless tobacco: Never Used   ??? Alcohol Use: No   ??? Drug Use: No   ??? Sexual Activity: None     Other Topics Concern   ??? None     Social History Narrative    ** Merged History Encounter **            SCREENINGS             PHYSICAL EXAM    (up to 7 for level 4, 8 or more for level 5)   ED Triage Vitals   BP Temp Temp Source Pulse Resp SpO2 Height Weight   01/11/15 2054 01/11/15 2054 01/11/15 2054 01/11/15 2054 01/11/15 2054 01/11/15 2054 01/11/15 2054 01/11/15 2054   159/96 mmHg 98.1 ??F (36.7 ??C) Oral 87 14 98 % 5\' 6"  (1.676 m) 280 lb (127.007 kg)       Physical Exam       General Appearance:  Alert, cooperative, no distress, non-toxic   Head:  Normocephalic, without obvious abnormality, atraumatic,    Eyes:  conjunctiva/corneas clear, EOM's intact.  Sclera anicteric.   ENT: Mucous membranes moist.  No stridor   Neck: Supple, symmetrical, trachea midline, no adenopathy.  No rigidity   Lungs:   No Respiratory Distress.  Clear bilaterally without wheezing/rales/rhonchi   Chest Wall:  symmetrical   Heart:   S1-S2 regular rate and rhythm without any murmur, gallop.  There is no JVD.     Abdomen:    soft nontender, habitus markedly limits exam.     Extremities:  Full range of motion.  No pain noted over any major bone or joint, no edema or cyanosis or clubbing   Pulses:  equal and symmetric    Skin:  No rashes or lesions to exposed skin.  Cap refill normal   Neurologic: Alert and oriented X 3.   CN 2-12 intact, strength and sensation symmetrical,  reflexes intact bilaterally, no focal deficits, cerebellar function intact       DIAGNOSTIC RESULTS   LABS:    Labs Reviewed   CBC WITH AUTO DIFFERENTIAL   BASIC METABOLIC PANEL   TROPONIN   APTT   PROTIME-INR   CK   MAGNESIUM   TROPONIN  Narrative:     Collection has been rescheduled by [facility] at 01/12/15 01:41. Reason: add on   to previous labs    [place] RT REFLEX TO CULTURE   TROPONIN   TROPONIN       All other labs were within normal range or not returned as of this dictation.    EKG: All EKG's are interpreted by the Emergency Department Physician who either signs or Co-signs this chart in the absence of a cardiologist.    Performed and read by myself initially 2049 shows sinus rhythm 80 beats a minute.  Axis leftward in leads 1 and aVF, with progression is slightly prolonged.  No pathological ST or T-wave changes noted, intervals within normal limits, no other pattern of injury identified.  Compared to EKG July 16, 2014 shows no changes    RADIOLOGY:   Non-plain film images such as CT, Ultrasound and MRI are read by the radiologist. Plain radiographic images are visualized and preliminarily interpreted by the  ED Provider with the below findings:        Interpretation per the Radiologist below, if available at the time of this note:    CTA Pulmonary With Contrast   Final Result   IMPRESSION:   No definite scan evidence for pulmonary embolus.         XR Chest Portable   Final Result   IMPRESSION:   Findings suggest congestive heart failure         Stress/Rest NM Myocardial SPECT - do not uncheck    (Results Pending)         PROCEDURES   Unless otherwise noted below, none     Procedures    CRITICAL CARE TIME   30 minutes of critical care time discharged this patient for frequent hemodynamic, cardiovascular, neurological and respiratory monitoring due to their life-threatening condition that required the diagnostics and treatment that is noted.  This is independent of all procedures,  family discussions and consultations      CONSULTS:  None    EMERGENCY DEPARTMENT COURSE and DIFFERENTIAL DIAGNOSIS/MDM:   Vitals:    Filed Vitals:    01/12/15 0001 01/12/15 0100 01/12/15 0312 01/12/15 0536   BP: 122/73 108/67  119/73   Pulse: 87 87  82   Temp:  99.5 ??F (37.5 ??C)  97.6 ??F (36.4 ??C)   TempSrc:  Temporal  Oral   Resp: 16 16 16 16    Height:       Weight:  289 lb 8 oz (131.316 kg)     SpO2: 93% 95% 93% 96%       Patient was given the following medications:  Medications   acetaminophen (TYLENOL) tablet 650 mg (not administered)   ALPRAZolam (XANAX) tablet 0.5 mg (not administered)   cetirizine (ZYRTEC) tablet 10 mg (not administered)   escitalopram (LEXAPRO) tablet 20 mg (not administered)   hydrochlorothiazide (HYDRODIURIL) tablet 25 mg (not administered)   liothyronine (CYTOMEL) tablet 25 mcg (not administered)   lisinopril (PRINIVIL;ZESTRIL) tablet 10 mg (not administered)   metaxalone (SKELAXIN) tablet 800 mg (not administered)   metoclopramide (REGLAN) tablet 5 mg (5 mg Oral Given 01/12/15 0551)   montelukast (SINGULAIR) tablet 10 mg (10 mg Oral Not Given 01/12/15 0221)   nystatin (MYCOSTATIN) powder (not administered)   pantoprazole (PROTONIX) tablet 40 mg (40 mg Oral Given 01/12/15 0551)   sodium chloride flush 0.9 % injection 10 mL (not administered)   sodium chloride flush 0.9 % injection 10  mL (not administered)   oxyCODONE (ROXICODONE) immediate release tablet 5 mg (not administered)     Or   oxyCODONE (ROXICODONE) immediate release tablet 10 mg (not administered)   HYDROmorphone (DILAUDID) injection 0.5 mg ( Intravenous See Alternative 01/12/15 0550)     Or   HYDROmorphone (DILAUDID) injection 1 mg (1 mg Intravenous Given 01/12/15 0550)   magnesium hydroxide (MILK OF MAGNESIA) 400 MG/5ML suspension 30 mL (not administered)   docusate sodium (COLACE) capsule 100 mg (not administered)   bisacodyl (DULCOLAX) suppository 10 mg (not administered)   ondansetron (ZOFRAN) injection 4 mg (4 mg Intravenous Given  01/12/15 0201)   enoxaparin (LOVENOX) injection 40 mg (not administered)   albuterol sulfate HFA 108 (90 BASE) MCG/ACT inhaler 2 puff (not administered)   aspirin tablet 325 mg (325 mg Oral Given 01/11/15 2152)   nitroglycerin (NITRO-BID) 2 % ointment 1 inch (1 inch Topical Given 01/11/15 2257)   iopamidol (ISOVUE-370) 76 % injection 100 mL ( Intravenous Given 01/11/15 2239)   HYDROmorphone (DILAUDID) injection 1 mg (1 mg Intravenous Given 01/11/15 2311)   nitroGLYCERIN (NITROSTAT) 0.4 MG SL tablet (  Given 01/12/15 0542)       Very pleasant 59 year old female presented today with chest pain.  Differential would include MI, PE, dissection, bronchitis or pneumonia, heart failure, reflux disease or other acute process.  IV established placed in the monitor, given aspirin and topical nitrates for pain.  EKG unremarkable.  X-ray of the chest shows findings concerning for pulmonary edema however this may also be under inflation because of the fact she has had no swelling, no weight gain, her lungs are clear on exam although decreased because of poor inspiration and habitus, and she has no JVD.  Laboratory studies are performed.  CBC, BMP, troponin and coagulation studies are unremarkable.  BMP also unremarkable.  CTA of the chest was performed here today read by the radiologist and shows no evidence of PE.  There is no focal consolidation, no pleural effusion, lungs are otherwise clear without any acute amount.  This point time she is feeling better.  Given risk factors including hypertension, high cholesterol, former smoker, obesity and cardiac disease in her family with new onset chest pain and do feel she requires consultation for cardiac monitoring, she agrees.  Therefore did speak to the on-call physician for her primary care doctor, and she is admitted to the medical service in stable and appropriate condition    The patient tolerated their visit well.   The patient and / or the family were informed of the results of any  tests, a time was given to answer questions.    FINAL IMPRESSION      1. Chest pain, unspecified chest pain type          DISPOSITION/PLAN   DISPOSITION Admitted    PATIENT REFERRED TO:  Lum Keaseresa M Slough O'Brien, MD  [address] [place]  [place] [id number]  [phone]            DISCHARGE MEDICATIONS:  Current Discharge Medication List          DISCONTINUED MEDICATIONS:  Current Discharge Medication List      STOP taking these medications       Lorcaserin HCl (BELVIQ) 10 MG TABS Comments:   Reason for Stopping:         liothyronine (CYTOMEL) 25 MCG tablet Comments:   Reason for Stopping:                      (  Please note that portions of this note were completed with a voice recognition program.  Efforts were made to edit the dictations but occasionally words are mis-transcribed.)    Karl Pock, DO (electronically signed)              Gustavus Bryant, DO  01/12/15 [phone]

## 2015-01-11 NOTE — Telephone Encounter (Signed)
I saw her for URI stuff the last time. So sounds like it is something different at this time. Not sure what Dr. Murrell Converse wants to do but I would suggest coming in for an appointment. Dr. Murrell Converse is in the office tomorrow, she can come in then.

## 2015-01-12 ENCOUNTER — Inpatient Hospital Stay
Admission: EM | Admit: 2015-01-12 | Discharge: 2015-01-13 | Disposition: A | Discharge status: Home / Self Care | Payer: PRIVATE HEALTH INSURANCE | Source: Home / Self Care | Admitting: Family Medicine

## 2015-01-12 DIAGNOSIS — R079 Chest pain, unspecified: Secondary | ICD-10-CM

## 2015-01-12 LAB — MAGNESIUM: Magnesium: 2.1 mg/dL (ref 1.80–2.40)

## 2015-01-12 LAB — CBC WITH AUTO DIFFERENTIAL
Basophils %: 1.3 %
Basophils Absolute: 0.1 10*3/uL (ref 0.0–0.2)
Eosinophils %: 5.6 %
Eosinophils Absolute: 0.5 10*3/uL (ref 0.0–0.6)
Hematocrit: 43.5 % (ref 36.0–48.0)
Hemoglobin: 14.3 g/dL (ref 12.0–16.0)
Lymphocytes %: 34.4 %
Lymphocytes Absolute: 3.1 10*3/uL (ref 1.0–5.1)
MCH: 28.9 pg (ref 26.0–34.0)
MCHC: 32.9 g/dL (ref 31.0–36.0)
MCV: 87.8 fL (ref 80.0–100.0)
MPV: 9.3 fL (ref 5.0–10.5)
Monocytes %: 10 %
Monocytes Absolute: 0.9 10*3/uL (ref 0.0–1.3)
Neutrophils %: 48.7 %
Neutrophils Absolute: 4.4 10*3/uL (ref 1.7–7.7)
Platelets: 193 10*3/uL (ref 135–450)
RBC: 4.95 M/uL (ref 4.00–5.20)
RDW: 14.9 % (ref 12.4–15.4)
WBC: 9.1 10*3/uL (ref 4.0–11.0)

## 2015-01-12 LAB — EKG 12-LEAD
Atrial Rate: 80 {beats}/min
Atrial Rate: 88 {beats}/min
P Axis: 26 degrees
P Axis: 32 degrees
P-R Interval: 142 ms
P-R Interval: 146 ms
Q-T Interval: 380 ms
Q-T Interval: 388 ms
QRS Duration: 72 ms
QRS Duration: 74 ms
QTc Calculation (Bazett): 438 ms
QTc Calculation (Bazett): 469 ms
R Axis: -10 degrees
R Axis: 4 degrees
T Axis: 39 degrees
T Axis: 50 degrees
Ventricular Rate: 80 {beats}/min
Ventricular Rate: 88 {beats}/min

## 2015-01-12 LAB — BASIC METABOLIC PANEL
Anion Gap: 13 (ref 3–16)
BUN: 14 mg/dL (ref 7–20)
CO2: 27 mmol/L (ref 21–32)
Calcium: 9.8 mg/dL (ref 8.3–10.6)
Chloride: 100 mmol/L (ref 99–110)
Creatinine: 0.8 mg/dL (ref 0.6–1.1)
GFR African American: >60 (ref >60)
GFR Non-African American: >60 (ref >60)
Glucose: 95 mg/dL (ref 70–99)
Potassium: 3.9 mmol/L (ref 3.5–5.1)
Sodium: 140 mmol/L (ref 136–145)

## 2015-01-12 LAB — TROPONIN
Troponin: <0.01 ng/mL (ref <0.01)
Troponin: <0.01 ng/mL (ref <0.01)
Troponin: <0.01 ng/mL (ref <0.01)
Troponin: <0.01 ng/mL (ref <0.01)

## 2015-01-12 LAB — PROTIME-INR
INR: 0.93 (ref 0.85–1.16)
Protime: 10.6 s (ref 9.8–13.0)

## 2015-01-12 LAB — BRAIN NATRIURETIC PEPTIDE: Pro-BNP: 24 pg/mL (ref 0–124)

## 2015-01-12 LAB — APTT: aPTT: 30 s (ref 25.2–36.4)

## 2015-01-12 LAB — CK: Total CK: 189 U/L (ref 26–192)

## 2015-01-12 MED ORDER — PANTOPRAZOLE SODIUM 40 MG PO TBEC
40 MG | Freq: Two times a day (BID) | ORAL | Status: DC
Start: 2015-01-12 — End: 2015-01-13
  Administered 2015-01-12 – 2015-01-13 (×4): 40 mg via ORAL

## 2015-01-12 MED ORDER — MAGNESIUM HYDROXIDE 400 MG/5ML PO SUSP
400 MG/5ML | Freq: Every day | ORAL | Status: DC | PRN
Start: 2015-01-12 — End: 2015-01-13

## 2015-01-12 MED ORDER — OXYCODONE HCL 5 MG PO TABS
5 MG | ORAL | Status: DC | PRN
Start: 2015-01-12 — End: 2015-01-13
  Administered 2015-01-12 – 2015-01-13 (×2): 10 mg via ORAL

## 2015-01-12 MED ORDER — NORMAL SALINE FLUSH 0.9 % IV SOLN
0.9 % | Freq: Two times a day (BID) | INTRAVENOUS | Status: DC
Start: 2015-01-12 — End: 2015-01-13
  Administered 2015-01-12 – 2015-01-13 (×4): 10 mL via INTRAVENOUS

## 2015-01-12 MED ORDER — MONTELUKAST SODIUM 10 MG PO TABS
10 MG | Freq: Every day | ORAL | Status: DC
Start: 2015-01-12 — End: 2015-01-13
  Administered 2015-01-13: 03:00:00 10 mg via ORAL

## 2015-01-12 MED ORDER — CETIRIZINE HCL 10 MG PO TABS
10 MG | Freq: Every day | ORAL | Status: DC
Start: 2015-01-12 — End: 2015-01-13
  Administered 2015-01-12 – 2015-01-13 (×2): 10 mg via ORAL

## 2015-01-12 MED ORDER — METAXALONE 800 MG PO TABS
800 MG | Freq: Three times a day (TID) | ORAL | Status: DC
Start: 2015-01-12 — End: 2015-01-13
  Administered 2015-01-12 – 2015-01-13 (×5): 800 mg via ORAL

## 2015-01-12 MED ORDER — ACETAMINOPHEN 325 MG PO TABS
325 MG | ORAL | Status: DC | PRN
Start: 2015-01-12 — End: 2015-01-13

## 2015-01-12 MED ORDER — HYDROMORPHONE 0.5MG/0.5ML IJ SOLN
1 MG/ML | Status: DC | PRN
Start: 2015-01-12 — End: 2015-01-13

## 2015-01-12 MED ORDER — IOPAMIDOL 76 % IV SOLN
76 % | Freq: Once | INTRAVENOUS | Status: AC | PRN
Start: 2015-01-12 — End: 2015-01-11
  Administered 2015-01-12: 04:00:00 via INTRAVENOUS

## 2015-01-12 MED ORDER — OXYCODONE HCL 5 MG PO TABS
5 MG | ORAL | Status: DC | PRN
Start: 2015-01-12 — End: 2015-01-13

## 2015-01-12 MED ORDER — ESCITALOPRAM OXALATE 10 MG PO TABS
10 MG | Freq: Every day | ORAL | Status: DC
Start: 2015-01-12 — End: 2015-01-13
  Administered 2015-01-12 – 2015-01-13 (×2): 20 mg via ORAL

## 2015-01-12 MED ORDER — DOCUSATE SODIUM 100 MG PO CAPS
100 MG | Freq: Two times a day (BID) | ORAL | Status: DC | PRN
Start: 2015-01-12 — End: 2015-01-13

## 2015-01-12 MED ORDER — LISINOPRIL 10 MG PO TABS
10 MG | Freq: Every day | ORAL | Status: DC
Start: 2015-01-12 — End: 2015-01-13
  Administered 2015-01-12 – 2015-01-13 (×2): 10 mg via ORAL

## 2015-01-12 MED ORDER — NITROGLYCERIN 0.4 MG SL SUBL
0.4 MG | SUBLINGUAL | Status: AC
Start: 2015-01-12 — End: 2015-01-12
  Administered 2015-01-12: 11:00:00

## 2015-01-12 MED ORDER — HYDROMORPHONE HCL 1 MG/ML IJ SOLN
1 MG/ML | Freq: Once | INTRAMUSCULAR | Status: AC
Start: 2015-01-12 — End: 2015-01-11
  Administered 2015-01-12: 04:00:00 1 mg via INTRAVENOUS

## 2015-01-12 MED ORDER — ALBUTEROL SULFATE HFA 108 (90 BASE) MCG/ACT IN AERS
108 (90 Base) MCG/ACT | Freq: Four times a day (QID) | RESPIRATORY_TRACT | Status: DC | PRN
Start: 2015-01-12 — End: 2015-01-13

## 2015-01-12 MED ORDER — HYDROMORPHONE HCL 1 MG/ML IJ SOLN
1 MG/ML | INTRAMUSCULAR | Status: DC | PRN
Start: 2015-01-12 — End: 2015-01-13
  Administered 2015-01-12 – 2015-01-13 (×4): 1 mg via INTRAVENOUS

## 2015-01-12 MED ORDER — HYDROCHLOROTHIAZIDE 25 MG PO TABS
25 MG | Freq: Every day | ORAL | Status: DC
Start: 2015-01-12 — End: 2015-01-13
  Administered 2015-01-12 – 2015-01-13 (×2): 25 mg via ORAL

## 2015-01-12 MED ORDER — FUROSEMIDE 10 MG/ML IJ SOLN
10 MG/ML | Freq: Once | INTRAMUSCULAR | Status: DC
Start: 2015-01-12 — End: 2015-01-11

## 2015-01-12 MED ORDER — NITROGLYCERIN 2 % TD OINT
2 % | Freq: Once | TRANSDERMAL | Status: AC
Start: 2015-01-12 — End: 2015-01-11
  Administered 2015-01-12: 04:00:00 1 [in_us] via TOPICAL

## 2015-01-12 MED ORDER — ASPIRIN 325 MG PO TABS
325 MG | Freq: Once | ORAL | Status: AC
Start: 2015-01-12 — End: 2015-01-11
  Administered 2015-01-12: 03:00:00 325 mg via ORAL

## 2015-01-12 MED ORDER — NORMAL SALINE FLUSH 0.9 % IV SOLN
0.9 % | INTRAVENOUS | Status: DC | PRN
Start: 2015-01-12 — End: 2015-01-13

## 2015-01-12 MED ORDER — METOCLOPRAMIDE HCL 10 MG PO TABS
10 MG | Freq: Four times a day (QID) | ORAL | Status: DC
Start: 2015-01-12 — End: 2015-01-13
  Administered 2015-01-12 – 2015-01-13 (×7): 5 mg via ORAL

## 2015-01-12 MED ORDER — ALBUTEROL SULFATE HFA 108 (90 BASE) MCG/ACT IN AERS
108 (90 Base) MCG/ACT | Freq: Four times a day (QID) | RESPIRATORY_TRACT | Status: DC | PRN
Start: 2015-01-12 — End: 2015-01-12

## 2015-01-12 MED ORDER — ENOXAPARIN SODIUM 40 MG/0.4ML SC SOLN
40 MG/0.4ML | Freq: Every day | SUBCUTANEOUS | Status: DC
Start: 2015-01-12 — End: 2015-01-13
  Administered 2015-01-12 – 2015-01-13 (×2): 40 mg via SUBCUTANEOUS

## 2015-01-12 MED ORDER — ACETAMINOPHEN 325 MG PO TABS
325 MG | ORAL | Status: DC | PRN
Start: 2015-01-12 — End: 2015-01-12

## 2015-01-12 MED ORDER — BISACODYL 10 MG RE SUPP
10 MG | Freq: Every day | RECTAL | Status: DC | PRN
Start: 2015-01-12 — End: 2015-01-13

## 2015-01-12 MED ORDER — LIOTHYRONINE SODIUM 25 MCG PO TABS
25 MCG | Freq: Every day | ORAL | Status: DC
Start: 2015-01-12 — End: 2015-01-12

## 2015-01-12 MED ORDER — ONDANSETRON HCL 4 MG/2ML IJ SOLN
4 MG/2ML | Freq: Four times a day (QID) | INTRAMUSCULAR | Status: DC | PRN
Start: 2015-01-12 — End: 2015-01-13
  Administered 2015-01-12 – 2015-01-13 (×4): 4 mg via INTRAVENOUS

## 2015-01-12 MED ORDER — AMITRIPTYLINE HCL 25 MG PO TABS
25 MG | Freq: Every evening | ORAL | Status: DC
Start: 2015-01-12 — End: 2015-01-13
  Administered 2015-01-13: 03:00:00 25 mg via ORAL

## 2015-01-12 MED ORDER — NYSTATIN 100000 UNIT/GM EX POWD
100000 UNIT/GM | Freq: Three times a day (TID) | CUTANEOUS | Status: DC
Start: 2015-01-12 — End: 2015-01-13
  Administered 2015-01-12 – 2015-01-13 (×3): via TOPICAL

## 2015-01-12 MED ORDER — ALPRAZOLAM 0.5 MG PO TABS
0.5 MG | Freq: Two times a day (BID) | ORAL | Status: DC | PRN
Start: 2015-01-12 — End: 2015-01-13

## 2015-01-12 MED FILL — METOCLOPRAMIDE HCL 10 MG PO TABS: 10 MG | ORAL | Qty: 1

## 2015-01-12 MED FILL — NORMAL SALINE FLUSH 0.9 % IV SOLN: 0.9 % | INTRAVENOUS | Qty: 10

## 2015-01-12 MED FILL — SKELAXIN 800 MG PO TABS: 800 MG | ORAL | Qty: 1

## 2015-01-12 MED FILL — NITRO-BID 2 % TD OINT: 2 % | TRANSDERMAL | Qty: 1

## 2015-01-12 MED FILL — ASPIRIN 325 MG PO TABS: 325 mg | ORAL | Qty: 1

## 2015-01-12 MED FILL — PROAIR HFA 108 (90 BASE) MCG/ACT IN AERS: 108 (90 Base) MCG/ACT | RESPIRATORY_TRACT | Qty: 8.5

## 2015-01-12 MED FILL — HYDROCHLOROTHIAZIDE 25 MG PO TABS: 25 MG | ORAL | Qty: 1

## 2015-01-12 MED FILL — PANTOPRAZOLE SODIUM 40 MG PO TBEC: 40 MG | ORAL | Qty: 1

## 2015-01-12 MED FILL — ONDANSETRON HCL 4 MG/2ML IJ SOLN: 4 MG/2ML | INTRAMUSCULAR | Qty: 2

## 2015-01-12 MED FILL — ZYRTEC 10 MG PO TABS: 10 MG | ORAL | Qty: 1

## 2015-01-12 MED FILL — HYDROMORPHONE HCL 1 MG/ML IJ SOLN: 1 MG/ML | INTRAMUSCULAR | Qty: 0.5

## 2015-01-12 MED FILL — LISINOPRIL 10 MG PO TABS: 10 MG | ORAL | Qty: 1

## 2015-01-12 MED FILL — LEXAPRO 10 MG PO TABS: 10 MG | ORAL | Qty: 2

## 2015-01-12 MED FILL — MYCOSTATIN 100000 UNIT/GM EX POWD: 100000 UNIT/GM | CUTANEOUS | Qty: 15

## 2015-01-12 MED FILL — HYDROMORPHONE HCL 1 MG/ML IJ SOLN: 1 MG/ML | INTRAMUSCULAR | Qty: 1

## 2015-01-12 MED FILL — LOVENOX 40 MG/0.4ML SC SOLN: 40 MG/0.4ML | SUBCUTANEOUS | Qty: 0.4

## 2015-01-12 MED FILL — NITROSTAT 0.4 MG SL SUBL: 0.4 MG | SUBLINGUAL | Qty: 25

## 2015-01-12 MED FILL — ISOVUE-370 76 % IV SOLN: 76 % | INTRAVENOUS | Qty: 100

## 2015-01-12 MED FILL — OXYCODONE HCL 5 MG PO TABS: 5 MG | ORAL | Qty: 2

## 2015-01-12 NOTE — ED Notes (Signed)
Report given to Bethena Roys RN on 3T. Bethena Roys aware that CT must be read before pt can come to the unit. Will continue to monitor and place pt on telemetry before pt goes to the floor. Electronically signed by Daisy Lazar, RN on 01/12/2015 at 12:22 AM       Daisy Lazar, RN  01/12/15 [phone]

## 2015-01-12 NOTE — Progress Notes (Signed)
Persistent musculoskeletal chest pain  Aortic sclerosis with normal EF as cause of cardiac murmur  H/o gastric sleeve with removal. CT scan findings likely residual from that    Rec- no further cardiac testing needed. Analgesia for therapy

## 2015-01-12 NOTE — Progress Notes (Signed)
ekg done

## 2015-01-12 NOTE — Progress Notes (Signed)
Pt admitted from ED, arrived on floor at around 2450 on stretcher, several family members at bedside.  Oriented to room, call light in reach, plan of care reviewed, questions answered, VU.  C/O bilateral rib cage pain that radiates to the back.  Denies SOB but says she has diff taking deep breaths, oxygen 2L n/c applied.   VSS.  Oxygen applied at 2L N/C, pt says this helps with her breathing.  Gave Dilaudid and Zofran per prn orders.  Assessment completed, see flow charts.  No distress noted.jmitchell

## 2015-01-12 NOTE — Other (Signed)
Patient Acct Nbr:  [id number]  Primary AUTH/CERT:    Maury Name:   [employer]/PREFERRED PPO/UMR/BUTL  Primary Insurance Plan Name:  [employer] - [employer] [place]  Primary Insurance Group Number:  [id number]  Primary Insurance Plan Type: N  Primary Insurance Policy Number:  [id number]

## 2015-01-12 NOTE — Progress Notes (Signed)
Problem--resp    Intervention--reviewed report by bedside--resp easy and even--o2 off without change in o2 sats noted--color remains pink--will con't to monitor    Goal--maintain o2 sats

## 2015-01-12 NOTE — Plan of Care (Signed)
Problem: Pain Control  Intervention: IM/PO/IV pain medication  Prn pain med requested and given as ordered, see MAR.

## 2015-01-12 NOTE — Consults (Signed)
PATIENT NAME:                 PA #:            MR #Rachael Mays, Rachael Mays                 [id number]       [id number]            ATTENDING PHYSICIAN:                     CONSULTATION DATE:           Kerry Dory, MD                       01/12/2015                   CONSULTING PHYSICIAN:                    DISCHARGE DATE:              Almon Hercules, MD                                                 DATE OF BIRTH:   AGE:           PATIENT TYPE:     RM #:              03-09-1956       58             IPF               29                  REASON FOR CONSULTATION:  Evaluate a patient with chest pain.       HISTORY OF PRESENT ILLNESS:  The patient is a pleasant 59 year old lady, who  has had a 3 to 4 week history of worsening shortness of breath with activity,  worsening pain on inspiration.  The pain is located over right lower chest  area, mid back.  It is sometimes worsened with exertion, but also occurs at  rest.  She has been tried on outpatient treatment for the plast several weeks  without improvement.  She has been evaluated by our practice with last stress  echocardiogram being done last May which was normal.  At that time her  echocardiogram showed aortic sclerosis with normal valve function and likely  that is the cause of her soft systolic murmur.  She does have a history of  cigarette use.  She quit 2 years ago.  She has also had a history of chronic  obesity.  She has weighed as much as 330 pounds.  She had a gastric band  placed and then due to erosion had to have the gastric lap band removed  approximately 2  to 3 years ago.  This was a very complicated course.       MEDICATIONS:  Prior to admission she is on:   1.  Reglan p.r.n.   2.  Skelaxin p.r.n.   3.  Xanax p.r.n.   4.  Lexapro 20 mg daily.    5.  Multivitamin.  6.  Albuterol p.r.n.   7.  HydroDIURIL.   8.  Lisinopril.    9.  Singulair.    10.  Lidex cream.    11.  Prilosec daily.     12.  Zyrtec.    13.  Tylenol p.r.n.       In  the hospital she has had an EKG done which was essentially normal.  She  has had several troponins which have been normal.  Telemetry has been  unremarkable.       PAST MEDICAL HISTORY:  Notable for lap band surgery with lap band removal  approximately 2 to 3 years ago, lap band done 8 years ago.  With this she had  approximately an 80 to 100 pound weight loss.  Now she has regained 50  pounds.  She also quit smoking 2 years ago with the use of Chantix.       SOCIAL HISTORY:  Lives with her husband.  She works full time as a Advertising account executive.       FAMILY HISTORY:  Noncontributory.       PHYSICAL EXAMINATION:    GENERAL:  She states she does have some pain when she takes in a breath.    LUNGS:  Currently are clear.    CARDIAC:  She has a soft systolic ejection murmur.    ABDOMEN:  Obese, no masses are felt.    EXTREMITIES:  Without edema.       CT scan is reviewed.  There is no evidence of for acute pulmonary embolus.   There is a concern about a hypervascular structure adjacent to the adrenal  gland, but I am sure that it is some residual scar tissue from the lap band  surgery and lap band removal.  This is an acute abnormality.       Magnesium 2.1, cholesterol 20 with an LDL of 132.  That is from May of 2015.   Glucose was normal on admission.       IMPRESSION:  Difficult problem with chest symptoms, vert likely a  musculoskeletal etiology.  Do not suspect acute coronary syndrome based on  testing, the persistence of the pain and lack of cardiac findings.       RECOMMENDATIONS:  Agree with analgesia.                                              Almon Hercules, MD     [id number]  DD: 01/12/2015 11:18   DT: 01/12/2015 11:38   Job #:  [id number]  CC: Kerry Dory, MD  CC: Almon Hercules, MD  CC: Carma Lair, MD

## 2015-01-12 NOTE — Progress Notes (Signed)
Awake, c/o level 7 chest pain, says it feels like something is sitting on her chest.  Also C/O bilateral ribcage pain which radiates to upper back.  Denies any nausea or SOB.  VSS.  Gave Nitro SL x1 without any relief.  Gave Dilaudid per pt's request.  EKG ordered.  On reassessment pt says her pain is down to 5 and tolerable.jmitchell

## 2015-01-12 NOTE — H&P (Signed)
PATIENT NAME:                 PA #:            MR #Rachael Mays, Rachael Mays                 [id number]       [id number]            ATTENDING PHYSICIAN:                  ADM DATE:   DIS DATE:          Kerry Dory, MD                    01/12/2015                     DATE OF BIRTH:   AGE:           PATIENT TYPE:     RM #:              February 07, 1956       58             IPF               [id number]                  HISTORY OF PRESENT ILLNESS:  This is a 59 year old female who presented to  the emergency room with chest pain. The patient has had chest pain ongoing  for the past 1-1/2 years since she had her lap band removed in July 2014.   She has had pain off and on.  She states that it usually would come every  week or so and then go away but for the past several weeks it has been every  day getting increasingly worse in the past couple days. It has been so severe  that she was not able to handle it anymore. She says it feels like heaviness,  like someone sitting on her chest.  It is worse when she does things but is  also at rest.  She says that it hurts to take deep breaths.  She has had a  cardiac workup in the past last May.  In June she had a stress echo which was  normal and in 2013 she had a cardiac catheterization which showed normal  coronary arteries with no coronary atherosclerosis.  She states that she is  having some shortness of breath.  She says the pain radiates from under her  ribs, it comes around into her back and goes 1/2 way up to her back above her  bra line.  She has not had any cough, cold, runny nose recently. She did have  a cold a few weeks ago but she says that is completely resolved.  Ears and  throat have been fine.  She has been eating and drinking okay although she  has had some nausea with the pain but no vomiting. No abdominal pain. No  bowel or bladder symptoms.  She says she has been gaining weight and she is  not sure why.     PAST MEDICAL HISTORY:   Significant for the chronic  chest pain.  History of  hypertension, hyperlipidemia, morbid obesity, chronic back pain.  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  She had a gastric band that she  eventually had removed because it was causing her problems. History of  depression, vertigo, tinnitus, [place]. History of hyperglycemia without  diabetes.       SURGERIES:  Include a C-section, gallbladder, hysterectomy, shoulder surgery.  She had her lap band surgery in 2009.  She had neck surgery and had plates  placed.  She has had a [place] filter.  In July 2014 she had her lap band  removed.  She has had an esophageal stent after the lap band.     FAMILY HISTORY:   Arthritis, depression, hypertension, heart disease,  diabetes, obesity, ovarian cancer and colon cancer.     SOCIAL HISTORY:  She is a former smoker. She quit 2 years ago.  She has a 35  pack year history.  She does not drink alcohol or use any drugs.     ALLERGIES:    MORPHINE which causes itching and TALWIN which makes her dizzy.     MEDICATIONS:  Prior to admission include:  1.  Reglan 5 mg 4 times a day before meals and nightly.  2.  Skelaxin 800 mg 3 times a day as needed.  3.  Nystatin powder p.r.n.   4.  Xanax 0.5 mg twice a day as needed.  5.  Lexapro 20 mg a day.  6.  Multivitamins.  7.  Calcium.  8.  Vitamin D.  9.  Hydrochlorothiazide 25 mg daily.  10.  Lisinopril 10 mg a day.  11.  Albuterol p.r.n.   12.  Singulair 10 mg a day.  13.  Omeprazole 40 mg a day.  14.  Zyrtec 10 mg a day.  15.  Tylenol p.r.n.      PHYSICAL EXAMINATION:    GENERAL: She is alert, pleasant and oriented x4 in no acute distress.   Morbidly obese female.    VITAL SIGNS:  She is afebrile.  Respirations are 16, pulse 75, blood pressure  127/70. She is 98% saturated on 2 L, weight is 289 pounds.  Her BMI is 46.8.  HEENT:   Pupils are equal, round and reactive to light.  Extraocular  movements are intact.  Mucous membranes are moist.  Throat is without  erythema.  NECK:   No adenopathy. No  thyromegaly.  LUNGS:   Clear.  HEART:    Regular rate and rhythm.  I do not appreciate any murmurs.  ABDOMEN:   Morbidly obese.  She has normal bowel sounds and is soft and  nontender to palpation.   CHEST:  Her chest wall is nontender to palpation.  No bruises or rashes are  noted.  EXTREMITIES:   Without edema. She has good pedal pulses bilaterally.     LABORATORY STUDIES:  Show normal chemistry, 3 sets of negative cardiac  enzymes.  A negative BNP.  A normal white count with normal differential,  normal INR.  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  Chest x-ray is  normal.  EKG showed normal sinus rhythm.     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  Chest pain  does not seem cardiac.  Her enzymes are negative.  EKG looks okay and given  her symptoms it sounds like her chronic chest pain.  We will  have cardiology  see her just to make sure that they have no concerns either.  IN talking with  her she has tried Gabapentin in the past.  She had not tried amitriptyline so  we will try her on some amitriptyline for chronic pain management.  I  discussed with her seeing pain management as an outpatient as well, however,  there is a questionable aneurysm on CT scan and we will have vascular surgery  come by and evaluate.  We will work on managing her pain and hopefully home  in the a.m.                                            Laymon Stockert Marliss Czar, MD     [phone]  DD: 01/12/2015 14:32   DT: 01/12/2015 15:08   Job #: [id number]  CC: Kerry Dory, MD  CC: Kentarius Partington Marliss Czar, MD

## 2015-01-12 NOTE — Progress Notes (Signed)
RESPIRATORY THERAPY ASSESSMENT    Rachael Mays   [id number]  03-08-1956      Recent Surgical Procedures:  none  Pulmonary History:  Asthma, PE  Home Respiratory Therapy:  Albuterol MDI Q6PRN  Home Oxygen Therapy:  none  Current Respiratory Therapy:  Albuterol MDI Q6PRN   Filed Vitals:    01/12/15 0312   BP:    Pulse:    Temp:    Resp: 16   SpO2: 93%       MPVC: na mL  Actual VC: na mL  Sputum:  ,  ,      ASSESSMENT OF RESPIRATORY SEVERITY INDEX (RSI)    Patients with orders for inhalation medications, oxygen, or any therapeutic treatment modality will be placed on Respiratory Protocol.  They will be assessed with the first treatment and at least every 72 hours thereafter.  The following severity scale will be used to determine frequency of treatment intervention.    Respiratory History: Smoking History Less than 1ppd or less than 15 pack year = 1    Recent Surgical History: None = 0    Level of Consciousness: Alert, Oriented, and Cooperative = 0    Level of Activity: Walking unassisted = 0    Respiratory Pattern: Regular Pattern; RR 8-20 = 0    Breath Sounds: Diminshed bilaterally and/or crackles = 2    Cough: Strong, spontaneous, non-productive = 0    SPO2 (COPD values may differ): 90-91% on room air or greater than 92% on FiO2 24- 28% = 1    Peak Flow (asthma only): not applicable = 0    RSI: 0-5 = See once and convert to home regimen or discontinue  PLAN    Goals: improve oxygenation    Patient educated on: Deep Breath and Cough     Comment / Outcomes: To improve oxygenation and ventilation    Plan of Care: Patient is on home treatment regimen.  No further assessment needed unless patient condition changes.        Is patient being placed on Home Treatment Regimen? Yes      Respiratory Protocol Guidelines    1. Assessment and treatment by Respiratory Therapy will be initiated for medication and therapeutic interventions upon initiation of aerosolized medication.  2. Physician will be contacted for  respiratory rate (RR) greater than 35 breaths per minute. Therapy will be held for heart rate (HR) greater than 140 beats per minute, pending direction from physician.  3. Bronchodilators will be administered via Metered Dose Inhaler (MDI) or Hydrofluoroalkane (HFA) with spacer when the following criteria are met:  a. Alert and cooperative     b. HR < 140 bpm  c. RR < 30 bpm                d. Can demonstrate a 2-3 second inspiratory hold  4. Bronchodilators will be administered via Hand Held Nebulizer [facility]) to patients when ANY of the following criteria are met  a. Incogizant or uncooperative          b. Patients treated with HHN at Home        c. Unable to demonstrate proper MDI or HFA technique     d. RR > 30 bpm   5. Bronchodilators will be delivered via Metered Dose Inhaler (MDI) or Hydrofluoroalkane (HFA) with spacer to intubated patients on mechanical ventilation.  6. Inhalation medication orders will be delivered and/or substituted as outlined below.    Aerosolized Medications Ordering and Administration  Guidelines:    1. All Medications will be ordered by a physician, and their frequency and/or modality will be adjusted as defined by the patients Respiratory Severity Index (RSI) score.  2. If the patient does not have documented COPD, consider discontinuing anticholinergics when RSI is less than 9.  3. If the bronchospasm worsens (increased RSI), then the bronchodilator frequency can be increased to a maximum of every 4 hours.  If greater than every 4 hours is required, the physician will be contacted.  4. If the bronchospasm improves, the frequency of the bronchodilator can be decreased, based on the patient's RSI, but not less than home treatment regimen frequency.  5. Bronchodilator(s) will be discontinued if patient has a RSI less than 9 and has received no scheduled or as needed treatment for 72  Hrs.    Patients Ordered on a Mucolytic Agent:    1. Must always be administered with a  bronchodilator.    2. Discontinue if patient experiences worsened bronchospasm, or secretions have lessened to the point that the patient is able to clear them with a cough.    Anti-inflammatory and Combination Medications:    1. If the patient lacks prior history of lung disease, is not using inhaled anti-inflammatory medication at home, and lacks wheezing by examination or by history for at least 24 hours, contact physician for possible discontinuation.      Pharmacy formulary interchanges for Respiratory medications can also be found by using the Smart Phrase .[email or web address] in any EPIC comment field.

## 2015-01-12 NOTE — H&P (Signed)
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

## 2015-01-12 NOTE — Consults (Signed)
Vascular Surgery Consultation    Date of Consultation:  01/12/2015    PCP:  Acquanetta Sit SLOUGH, MD       Chief Complaint: Chest wall and shoulder pain with upper extremity paresthesias     History of Present Illness:   We are asked to see this patient in consultation by Dr.Koehl  regarding  Rachael Mays is a 59 y.o. female who presents with a chronic history of having chest, back, substernal discomfort unrelated to positioning or activity who returns to the hospital with similar symptoms in addition with complaints of paresthesias into her upper extremities and hands.  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  The patient denies any fevers or chills and has no history to suggest a mycotic process.  She has had a previous lap band procedure that eventually eroded requiring major intra-abdominal surgery with a long protracted hospital course.  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  There is suggestion that this has increased in size.    This patient complains of chest pain with associated tenderness and is had episodes of pleuritic components.  She does state that the discomfort radiates into her shoulders and down into her hands most recently.  She has had a previous 2 level cervical neck fusion by Dr. Darlis Loan approximately 10 years ago at [facility].  She has not complained of any weakness in her arms but is had some limited mobility of her neck and is disabled.  She is undergone a cardiac evaluation.  She vocalizes no complaints of nausea or vomiting, belching or symptoms that are compatible  with reflux and is had no issues with regard to swallowing.  She is status post cholecystectomy.  She denies any history of pancreatitis.  She has had a previous esophageal stent in August 2014.     Past Medical History:  Past Medical History   Diagnosis Date   ??? Anxiety    ??? Asthma    ??? Chronic back pain    ??? Deep vein thrombosis    ??? Depression    ??? GERD (gastroesophageal reflux disease)      NO LONGER SINCE LAP   ??? Pulmonary embolism (HCC)    ??? Obesity      hx of; had lap band   ??? Hyperlipidemia      hx; resolved with lap band   ??? GERD (gastroesophageal reflux disease) 01/23/2009   ??? Hypertension        Past Surgical History:  Past Surgical History   Procedure Laterality Date   ??? Colonoscopy     ??? Upper gastrointestinal endoscopy     ??? Cesarean section     ??? Cholecystectomy     ??? Hysterectomy     ??? Shoulder surgery     ??? Lap band  05/01/08     Dr. Nicole Cella   ??? Back surgery       neck  plates   ??? Colonoscopy  04/08/10   ??? Other surgical history       [place] Filter: curently in place   ???  Upper gastrointestinal endoscopy  05/19/13   ??? Other surgical history  07/05/13     lap band removal   ??? Upper gastrointestinal endoscopy  07/21/13     ESOPHAGEAL STENT PLACEMENT   ??? Upper gastrointestinal endoscopy  07/21/13     WITH EXCHANGE OF ESOPHAGEAL STENT   ??? Partial hysterectomy     ??? Upper gastrointestinal endoscopy N/A 07/31/2014     Esophagogastroduodenoscopy with esophageal balloon dilation       Home Medications:   Prior to Admission medications    Medication Sig Start Date End Date Taking? Authorizing Provider   metoclopramide (REGLAN) 5 MG tablet Take 1 tablet by mouth 4 times daily (before meals and nightly) 01/09/15  Yes Harvel Quale, MD   metaxalone [facility]) 800 MG tablet TAKE ONE TABLET BY MOUTH THREE TIMES A DAY 01/02/15  Yes Jamal Collin, MD   metaxalone [facility]) 800 MG tablet Take 1 tablet by mouth 3 times daily 11/28/14  Yes Harvel Quale, MD   nystatin (MYCOSTATIN) 100000 UNIT/GM  powder Apply 3 times daily. 11/12/14  Yes Harvel Quale, MD   ALPRAZolam Duanne Moron) 0.5 MG tablet Take 1 tablet by mouth 2 times daily as needed for Anxiety 11/05/14  Yes Ulla Gallo Erle Crocker., MD   escitalopram [facility]) 20 MG tablet Take 1 tablet by mouth daily 08/27/14  Yes Carla Drape., MD   Multiple Vitamins-Minerals (MULTIVITAMIN PO) Take by mouth   Yes Historical Provider, MD   CALCIUM-VITAMIN D PO Take by mouth   Yes Historical Provider, MD   albuterol (PROVENTIL HFA) 108 (90 BASE) MCG/ACT inhaler Inhale 2 puffs into the lungs every 6 hours as needed for Wheezing 05/17/14  Yes Harvel Quale, MD   hydrochlorothiazide (HYDRODIURIL) 25 MG tablet Take 1 tablet by mouth daily 04/10/14  Yes Harvel Quale, MD   lisinopril (PRINIVIL;ZESTRIL) 10 MG tablet Take 1 tablet by mouth daily 04/10/14  Yes Harvel Quale, MD   montelukast (SINGULAIR) 10 MG tablet TAKE ONE TABLET BY MOUTH DAILY 03/16/14  Yes Jamal Collin, MD   fluocinonide (LIDEX) 0.05 % cream Apply topically 2 times daily. 03/05/14  Yes Harvel Quale, MD   omeprazole (PRILOSEC) 40 MG capsule Take 1 capsule by mouth daily. 07/21/13  Yes Norwood Levo, MD   cetirizine (ZYRTEC ALLERGY) 10 MG tablet Take 10 mg by mouth daily.   Yes Historical Provider, MD   acetaminophen 650 MG TABS Take 650 mg by mouth every 4 hours as needed. 08/20/13   Jamal Collin, MD        Facility Administered Medications:   ??? cetirizine  10 mg Oral Daily   ??? escitalopram  20 mg Oral Daily   ??? hydrochlorothiazide  25 mg Oral Daily   ??? lisinopril  10 mg Oral Daily   ??? metaxalone  800 mg Oral TID   ??? metoclopramide  5 mg Oral 4x Daily AC & HS   ??? montelukast  10 mg Oral Daily   ??? nystatin   Topical TID   ??? pantoprazole  40 mg Oral BID AC   ??? sodium chloride flush  10 mL Intravenous 2 times per day   ??? enoxaparin  40 mg Subcutaneous Daily   ??? amitriptyline  25 mg Oral Nightly       Allergies:  Morphine and Talwin     Social  History:    Working:   Caffeine:  Lifestyle:    History     Social History   ??? Marital Status: Married     Spouse Name: N/A     Number of Children: N/A   ??? Years of Education: N/A     Occupational History   ??? Not on file.     Social History Main Topics   ??? Smoking status: Former Smoker -- 0.00 packs/day for 35 years     Types: Cigarettes     Quit date: 08/07/2012   ??? Smokeless tobacco: Never Used   ??? Alcohol Use: No   ??? Drug Use: No   ??? Sexual Activity: Not on file     Other Topics Concern   ??? Not on file     Social History Narrative    ** Merged History Encounter **            Family History:  Heart Disease:   Stroke:   Cancer:   Diabetes:   Hypertension:   Aneurysm/PVD:         Problem Relation Age of Onset   ??? Arthritis Mother    ??? Cancer Mother    ??? Depression Mother    ??? High Blood Pressure Mother    ??? Heart Disease Maternal Grandmother    ??? Early Death Paternal Grandmother    ??? Heart Disease Paternal Grandfather    ??? Diabetes Other    ??? High Blood Pressure Other    ??? Obesity Other    ??? Heart Disease Mother 62     MI    ??? Ovarian Cancer Mother 60   ??? Cancer Father 2     colon   ??? Cancer Mother          The following symptoms have been addressed with the patient and are negative:    Constitutional:fever,chills,weight loss,gain,night sweats     HEENT: blurred vision,eye pain, floaters, cataracts, nose bleeds, discharge,earache, discharge, tinnitus,sore throat,hoarseness       Respiratory: cough,wheezing,SOB, TB exposure,hemoptysis,sputum production       Cardiac:,diaphoresis,palpitations ,DOE,PND  History of endocarditis,history of valvular disorder        GI: nausea,dyspepsia, jaundice,stool caliber, const/diarrhea   food fear,reflux        GU: kidney stones,urgency,malignancy incontinence,dysuria,frequency,hematuria         Musculo: fractures, joint pain, myalgias,arthralgia,assymmetry,spasticity,atrophy        Skin:rashes,moles,ulcers,mass             Neuro: dizziness,vertigo,nystagmus,diplopia,amurosis,  hemiparesis, hemiplegia, dementia,tremor ,memory change        Psych: ideations,ruminations,anger,sadness            Endo: excessive thirst,hunger,polyuria,polydypsia,heat  And cold intolerance,sweating, fatigue,loss of energy        Heme: clotting/bleeding disorders,enlarged lymph glands    Solid cancers, blood borne cancer             Allergy: itching ,hives,sneezing,dust /pollen          Physical Examination:    BP 101/57 mmHg   Pulse 71   Temp(Src) 98.6 ??F (37 ??C) (Temporal)   Resp 16   Ht 5\' 6"  (1.676 m)   Wt 289 lb 8 oz (131.316 kg)   BMI 46.75 kg/m2   SpO2 93%   BP RUE:  BP LUE:      Admission Weight: 280 lb (127.007 kg)   Hand dominance:    HEENT: Head is atraumatic, normocephalic, extraocular muscles intact, no xanthelasmas or scleral icterus, no temporal wasting.  The external orifice disease of the ears  appear free of any obstruction, the nose does not demonstrate any signs of obstruction or drainage.  The oropharynx is without injection or mass    Neck: The neck is supple, decreased  range of motion, no thyromegaly or cervical adenopathy.  There is no stridor or jugular venous distention.  She has an operative incision on her neck which is well-healed.    Chest: There is mild chest wall tenderness, inspiratory and expiratory effort appears to be normal, no evidence of stridor with clear lung fields noted.    Cor: The rhythm appears to be regular with a rate that is normal, and S1 and S2 heart sounds are normal with no S4 or S3 gallop and no murmur that can be appreciated.  The PMI does not appear to be displaced.    Abdomen: Soft, nontender, no hepatosplenomegaly, no prominent abdominal aortic pulsation, no evidence of any hernia or mass effect.  Bowel sounds are normal active.  Abdominal incision is noted    Musculoskeletal: Normal range of motion of the upper and lower extremities without evidence of crepitus, no bony deformity or localized bony tenderness    Vascular: There are bilateral 2+ brachial  and radial artery pulses, bilateral 2+ femoral and dorsalis pedis artery pulses, no evidence of any prominent abdominal pulsation or bruit nor any femoral bruits.  There is mild edema noted of the lower extremities.    Lymph: There is no evidence of any cervical, infraclavicular or inguinal adenopathy.    Neuro: The patient is alert, oriented to time place and person.  Cranial nerves are without deficit.  Upper and lower extremity sensation appears symmetric and intact.  Upper and lower extremity motor function appears symmetric and intact.  There is no abnormal tone or spasticity.    Psychiatric: Mood and affect appear to be normal,  short-term, long-term memory appear intact.  Thought content, judgment and behavior appear normal    Pulses:    carotid brachial radial femoral popliteal DP PT   RIGHT          LEFT              MEDICAL DECISION MAKING/TESTING      CT: I personally reviewed the CT angiogram of the pulmonary arteries that was performed in comparison to studies that she had had previously as well as CT scans of the abdomen and pelvis with contrast.  I did discuss these studies with Dr. Maia Petties, radiologist who reviewed them also.  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  One can see a small vessel that is likely some type of adrenal artery which enters off the celiac artery coursing behind the pancreas.  This well enhanced mass does not have any rim of calcium nor any suggestion of mural thrombus    Carotid duplex:     Venous duplex:    Labs:   CBC:   Recent Labs      01/11/15   2146   WBC  9.1   HGB  14.3   HCT  43.5   MCV  87.8   PLT  193     BMP:   Recent Labs      01/11/15   2146   NA  140   K  3.9   CL  100   CO2  27   BUN  14  CREATININE  0.8   CALCIUM  9.8   MG  2.10     Cardiac Enzymes:   Recent Labs      01/11/15   2146  01/12/15   0536  01/12/15   1322    CKTOTAL  189   --    --    TROPONINI  <0.01  <0.01  <0.01     PT/INR:   Recent Labs      01/11/15   2146   PROTIME  10.6   INR  0.93     APTT:   Recent Labs      01/11/15   2146   APTT  30.0     Liver Profile:  Lab Results   Component Value Date    AST 49 07/16/2014    ALT 60 07/16/2014    BILIDIR <0.2 07/16/2014    BILITOT 0.3 07/16/2014    ALKPHOS 100 07/16/2014     Lab Results   Component Value Date    CHOL 208 05/04/2014    HDL 49 05/04/2014    HDL 52 03/31/2011    TRIG 137 05/04/2014     TSH:  Lab Results   Component Value Date    TSH 3.76 04/17/2014    TSH 1.42 03/31/2011     UA:   Lab Results   Component Value Date    COLORU Yellow 07/16/2014    PHUR 5.5 07/16/2014    LABCAST 0-1 Hyaline 07/09/2013    WBCUA 4 07/16/2014    RBCUA 2 07/16/2014    RBCUA neg 05/25/2014    MUCUS 1+ 07/09/2013    YEAST neg 05/25/2014    BACTERIA 2+ 07/16/2014    BACTERIA 1+ 05/25/2014    CLARITYU Clear 07/16/2014    SPECGRAV 1.025 07/16/2014    LEUKOCYTESUR Negative 07/16/2014    LEUKOCYTESUR small 05/25/2014    UROBILINOGEN 0.2 07/16/2014    BILIRUBINUR SMALL 07/16/2014    BLOODU Negative 07/16/2014    GLUCOSEU Negative 07/16/2014       History obtained: I personally reviewed the patient's laboratory work, performed independent visualization of radiographic or ultrasound images , reviewed and with summarization of old records via [place] as well as discussed the radiographic findings with the radiologist    Risk factors:     Diagnosis: This patient demonstrates a 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  There clearly is no rim of calcification which one would  likely suspect it present for more than 7 years.  I suppose it is possible that this could represent a pseudoaneurysm, not likely a mycotic aneurysm as the patient is not acting as if she has any chronic septic history.  Its retroperitoneal location would suggest it is not likely posttraumatic from her extensive abdominal surgical procedure in 2014 when her lap band extruded through her stomach requiring a prolonged hospital course.  I suppose this could also represent a hypervascular adrenal mass, nonfunctional pheochromocytoma.  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    As relates to the patient's chest wall shoulder and paresthesias of the upper extremities , this finding  has no relevance to her complaints.  She has had previous anterior cervical neck fusion and perhaps an EMG or CT scan of the cervical neck may be helpful in determining whether her complaints could be radicular from her cervical thoracic spine disease.    Complexity of encounter:    Plan: At some point in time I suspect an elective angiogram of her visceral vessels would be helpful in elucidating whether this is a hypervascular mass or a visceral branch aneurysm.  I will let Dr. Bertram Millard know about hospitalization so he can follow-up with her in the future.

## 2015-01-12 NOTE — Progress Notes (Signed)
Problem--pain    Intervention--frequent c/o back rib cage pain--request for pain medication--medication appears to be effective against the pain--able to ambulate with slight difficulty--will con't to observe    Goal--manage pain

## 2015-01-12 NOTE — Progress Notes (Signed)
C/o nausea without emesis--medicated with zofran per request

## 2015-01-13 MED ORDER — HYDROCODONE-ACETAMINOPHEN 5-325 MG PO TABS
5-325 MG | ORAL_TABLET | Freq: Four times a day (QID) | ORAL | Status: AC | PRN
Start: 2015-01-13 — End: 2015-01-20

## 2015-01-13 MED ORDER — ONDANSETRON HCL 4 MG PO TABS
4 MG | ORAL_TABLET | Freq: Every day | ORAL | Status: DC | PRN
Start: 2015-01-13 — End: 2015-06-03

## 2015-01-13 MED FILL — SKELAXIN 800 MG PO TABS: 800 MG | ORAL | Qty: 1

## 2015-01-13 MED FILL — ONDANSETRON HCL 4 MG/2ML IJ SOLN: 4 MG/2ML | INTRAMUSCULAR | Qty: 2

## 2015-01-13 MED FILL — NORMAL SALINE FLUSH 0.9 % IV SOLN: 0.9 % | INTRAVENOUS | Qty: 10

## 2015-01-13 MED FILL — METOCLOPRAMIDE HCL 10 MG PO TABS: 10 MG | ORAL | Qty: 1

## 2015-01-13 MED FILL — HYDROMORPHONE HCL 1 MG/ML IJ SOLN: 1 MG/ML | INTRAMUSCULAR | Qty: 1

## 2015-01-13 MED FILL — AMITRIPTYLINE HCL 25 MG PO TABS: 25 MG | ORAL | Qty: 1

## 2015-01-13 MED FILL — LEXAPRO 10 MG PO TABS: 10 MG | ORAL | Qty: 2

## 2015-01-13 MED FILL — SINGULAIR 10 MG PO TABS: 10 MG | ORAL | Qty: 1

## 2015-01-13 MED FILL — PANTOPRAZOLE SODIUM 40 MG PO TBEC: 40 MG | ORAL | Qty: 1

## 2015-01-13 MED FILL — HYDROCHLOROTHIAZIDE 25 MG PO TABS: 25 MG | ORAL | Qty: 1

## 2015-01-13 MED FILL — ZYRTEC 10 MG PO TABS: 10 MG | ORAL | Qty: 1

## 2015-01-13 MED FILL — LOVENOX 40 MG/0.4ML SC SOLN: 40 MG/0.4ML | SUBCUTANEOUS | Qty: 0.4

## 2015-01-13 MED FILL — OXYCODONE HCL 5 MG PO TABS: 5 MG | ORAL | Qty: 2

## 2015-01-13 MED FILL — LISINOPRIL 10 MG PO TABS: 10 MG | ORAL | Qty: 1

## 2015-01-13 MED FILL — NORMAL SALINE FLUSH 0.9 % IV SOLN: 0.9 % | INTRAVENOUS | Qty: 20

## 2015-01-13 NOTE — Discharge Summary (Signed)
PATIENT NAME:                 PA #:            MR #Rachael Mays, Rachael Mays                 [id number]       [id number]            ATTENDING PHYSICIAN:                  ADM DATE:   DIS DATE:          Kerry Dory, MD                    01/12/2015  01/13/2015         DATE OF BIRTH:   AGE:           PATIENT TYPE:                         12-17-1955       58             IPF                                      ADMISSION DIAGNOSIS:  Chest pain.     DISMISSAL DIAGNOSES:    1.  Chronic chest wall pain.   2.  Hypertension.   3.  Morbid obesity.   4.  Celiac artery aneurysm.   5.  Hyperlipidemia.      The patient is a 59 year old white female admitted to the hospital after  presenting to the emergency room with chest pain.  The patient was evaluated  in the hospital.  Serial troponins were negative x4.  Electrolytes were  normal.  BUN 14, creatinine 0.8.  Blood sugar was normal.  The patients CPK  was normal.  White count 9.1, hemoglobin 14.3.  The patients INR and pro-time  were normal.  The patients chest x-ray showed no evidence of active disease.   CTA pulmonary with contrast was negative.      The patient has undergone stress testing within the last 6 months that was  normal.  The patient was seen in consultation by Dr. Luiz Blare for cardiology, who  felt this was noncardiac in nature.      The patient was clinically stable at the time of dismissal from the hospital.  The feeling is that the patient had chest wall pain.  This did not seem to be  exacerbated by position, activity, food, stress, weather changes.  Difficult  to evaluate the etiology of the symptoms.      Patient dismissed on her preadmission medications but given prescription for  Norco 5/325, #30, and Zofran.  The patients Norco is for short-term usage, as  explained to the patient.  As she has chronic pain, needs to get in to a pain  specialist for further evaluation and treatment, but not by [place].                                             Kerry Dory, MD  [id number]  DD: 01/13/2015 09:15   DT: 01/13/2015 10:44   Job #: [id number]  CC: Kerry Dory, MD  CC: Carma Lair, MD

## 2015-01-13 NOTE — Progress Notes (Signed)
Problem--pain    Intervention--resting quietly--stated pain improved with medication--but con't to have pain    Goal--manage pain

## 2015-01-13 NOTE — Progress Notes (Signed)
Rachael Mays is a 59 y.o. female patient.    Current Facility-Administered Medications   Medication Dose Route Frequency Provider Last Rate Last Dose   ??? acetaminophen (TYLENOL) tablet 650 mg  650 mg Oral Q4H PRN Darrick Penna, MD       ??? ALPRAZolam Duanne Moron) tablet 0.5 mg  0.5 mg Oral BID PRN Darrick Penna, MD       ??? cetirizine (ZYRTEC) tablet 10 mg  10 mg Oral Daily Darrick Penna, MD   10 mg at 01/13/15 7253   ??? escitalopram (LEXAPRO) tablet 20 mg  20 mg Oral Daily Darrick Penna, MD   20 mg at 01/13/15 6644   ??? hydrochlorothiazide (HYDRODIURIL) tablet 25 mg  25 mg Oral Daily Darrick Penna, MD   25 mg at 01/13/15 0347   ??? lisinopril (PRINIVIL;ZESTRIL) tablet 10 mg  10 mg Oral Daily Darrick Penna, MD   10 mg at 01/13/15 0829   ??? metaxalone (SKELAXIN) tablet 800 mg  800 mg Oral TID Darrick Penna, MD   800 mg at 01/13/15 4259   ??? metoclopramide (REGLAN) tablet 5 mg  5 mg Oral 4x Daily AC & HS Darrick Penna, MD   5 mg at 01/13/15 5638   ??? montelukast (SINGULAIR) tablet 10 mg  10 mg Oral Daily Darrick Penna, MD   10 mg at 01/12/15 2143   ??? nystatin (MYCOSTATIN) powder   Topical TID Darrick Penna, MD       ??? pantoprazole (PROTONIX) tablet 40 mg  40 mg Oral BID AC Darrick Penna, MD   40 mg at 01/13/15 7564   ??? sodium chloride flush 0.9 % injection 10 mL  10 mL Intravenous 2 times per day Darrick Penna, MD   10 mL at 01/13/15 3329   ??? sodium chloride flush 0.9 % injection 10 mL  10 mL Intravenous PRN Darrick Penna, MD       ??? oxyCODONE (ROXICODONE) immediate release tablet 5 mg  5 mg Oral Q4H PRN Darrick Penna, MD        Or   ??? oxyCODONE (ROXICODONE) immediate release tablet 10 mg  10 mg Oral Q4H PRN Darrick Penna, MD   10 mg at 01/12/15 1810   ??? HYDROmorphone (DILAUDID) injection 0.5 mg  0.5 mg Intravenous Q3H PRN Darrick Penna, MD        Or   ??? HYDROmorphone (DILAUDID) injection 1 mg  1 mg Intravenous Q3H PRN Darrick Penna, MD   1 mg at 01/13/15 5188   ??? magnesium hydroxide (MILK OF  MAGNESIA) 400 MG/5ML suspension 30 mL  30 mL Oral Daily PRN Darrick Penna, MD       ??? docusate sodium (COLACE) capsule 100 mg  100 mg Oral BID PRN Darrick Penna, MD       ??? bisacodyl (DULCOLAX) suppository 10 mg  10 mg Rectal Daily PRN Darrick Penna, MD       ??? ondansetron [facility]) injection 4 mg  4 mg Intravenous Q6H PRN Darrick Penna, MD   4 mg at 01/13/15 4166   ??? enoxaparin (LOVENOX) injection 40 mg  40 mg Subcutaneous Daily Darrick Penna, MD   40 mg at 01/13/15 0829   ??? albuterol sulfate HFA 108 (90 BASE) MCG/ACT inhaler 2 puff  2 puff Inhalation Q6H PRN Kerry Dory, MD       ??? amitriptyline (ELAVIL) tablet  25 mg  25 mg Oral Nightly Darrick Penna, MD   25 mg at 01/12/15 2144     Allergies   Allergen Reactions   ??? Morphine Itching   ??? Talwin [Pentazocine] Other (See Comments)     dizzy     Principal Problem:    Chest pain  Active Problems:    Hyperlipidemia    Morbid obesity (HCC)    HTN (hypertension)    Celiac artery aneurysm (HCC)    Adrenal mass, left (HCC)    Blood pressure 115/67, pulse 89, temperature 98 ??F (36.7 ??C), temperature source Temporal, resp. rate 16, height 5\' 6"  (1.676 m), weight 291 lb 11.2 oz (132.314 kg), SpO2 90 %, not currently breastfeeding.    Subjective:  Symptoms:  (Pt cont to have chest pain-states the pain is worse as the day continues; no correlation c diet, position, activity, weather, stresses, etc.  Has nausea c this a lot).      Objective:  General Appearance:  Uncomfortable.    Vital signs: (most recent): Blood pressure 115/67, pulse 89, temperature 98 ??F (36.7 ??C), temperature source Temporal, resp. rate 16, height 5\' 6"  (1.676 m), weight 291 lb 11.2 oz (132.314 kg), SpO2 90 %, not currently breastfeeding.  Vital signs are normal.  Fever.    Output: Producing urine and producing stool.    Lungs:  Normal effort.  Breath sounds clear to auscultation.    Heart: Normal rate.    Chest: No chest wall tenderness.    Neurological: Patient is alert and oriented to  person, place and time.    Abdomen: Abdomen is soft and non-distended.  Bowel sounds are normal.       Assessment:    Condition: Unchanged.   (Cont to have chest pain-workup totally unremarkable. Discussed c Dr Esmeralda Arthur not feel cardiac etiol.  Has reached max benefit of hospitalization- will dismiss to home and further eval and tx as OP  ? Aneurysm-no tx; will be further eval as OP    Norco -short term; then must see pain specialist    Disch summ dict.  F/u Dr Murrell Converse).       Kerry Dory, MD  01/13/2015

## 2015-01-13 NOTE — Progress Notes (Signed)
Able to have some relief with po pain meds--pt appears comfortable--will con't to observe

## 2015-01-13 NOTE — Progress Notes (Signed)
Data- discharge order received, pt verbalized agreement to discharge, disposition to previous residence, no needs for HHC/DME.     Action- discharge instructions prepared and given to patient, pt verbalized understanding. Medication information packet given r/t NEW and/or CHANGED prescriptions emphasizing name/purpose/side effects, pt verbalized understanding. Discharge instruction summary: Diet- low salt, Activity- as tolerated, Primary Care Physician as follows: Rachael Sit SLOUGH, MD [phone] f/u appointment 2 weeks, immunizations reviewed and updated, prescription medications filled at local pharm    Response- Pt belongings gathered, IV removed. Disposition is home (no HHC/DME needs), transported with instructions, taken to lobby via w/c w/ belongings, no complications.

## 2015-01-14 NOTE — Care Coordination-Inpatient (Signed)
[facility] IP Discharge Follow up Call    Date of [place]: [facility]  Non-face-to-face services provided:  Obtained and reviewed discharge summary and/or continuity of care documents    Reason for Hospital Visit:  Chest pain  Discharged with Home Health?:  No    Date of first visit after discharge:    Status:     not changed    Did you receive a discharge summary with list of medication from the hospital? Yes  Review of Instructions:     Understands what to report/when to return?:  Yes   Understands discharge instructions?:  Yes   Following discharge instructions?:  Yes   If not why?   Is there any lingering symptoms? Yes continued chest pain  Are you eating and drinking OK? Yes  Any other problems i.e. Constipation, other symptoms?  No  Are there any new complaints of pain? No  If you have a wound is the dressing clean, dry, and intact? N/A  Understands wound care regimen? N/A  Are there any other complaints/concerns that you wish to tell your provider?      FU appts/Provider:    Future Appointments  Date Time Provider [place]   01/28/2015 1:00 PM Kate Sable, MD [facility] None       New Medications at discharge?:     Yes                      Medication Reconciliation by phone -     Each medication was reviewed (both pre and post hospitalization)  Yes    Were there discrepancies in medications?  No  If YES, were discrepancies addressed? Not Applicable    Understands Medications?  Yes   Questions about medications from pt or caregiver?   No   Questions addressed? Not Applicable                Has all of medication and/or prescriptions   No  Taking Medications? Except new meds--hasn't gotten these filled yet  Can you swallow your pills?  Yes    Is the patient having any trouble with ADL's or IADL's? No  Is the patient able to move around as expected? Yes  Needs Equipment?  No    Link to services in community?:  N/A   Which  services:  Patient states she is getting so frustrated with her continued health issues.  She is wondering if she should go somewhere "like the [facility]" to be evaluated.  Will discuss with PCP.  Encouraged patient to make hsop f/u appt with PCP

## 2015-01-15 NOTE — Telephone Encounter (Signed)
Spoke to pt; pt wants to see "heart doctor" first then will call PCP office back to schedule hosp f/u

## 2015-01-15 NOTE — Telephone Encounter (Signed)
-----   Message from Eda Paschal sent at 01/14/2015  4:13 PM EST -----  The following patients have been discharged from a [facility] within the past 24-48 hours.  Please discuss a follow-up plan with each patient???s PCP and document in a telephone note using the smartphrase .tcmpdc. (May need to share it with yourself from the [place]). Reason for call is "hospital follow up"    If you have a Care Coordinator- please review the list of patients with your Care Coordinator to ensure no duplication occurs. Thank you    [id number] [facility] S 7.75 Chest pain [facility] [place] [facility] [place] Acquanetta Sit Jan 13 2015  5:49PM [phone]

## 2015-01-16 NOTE — Telephone Encounter (Signed)
Patient called, she feels like Dr. Kennon Holter was released from the hospital too soon.  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  She was to see [facility] again but she was "booted" out of the hospital  Too quickly and didn't get a chance to see Cardiology. She is not sure   Who she should follow up with or if she needs to go back into the hospital

## 2015-01-16 NOTE — Telephone Encounter (Signed)
Patient notified and will call Dr Lucendia Herrlich office for a follow up appointment.

## 2015-01-16 NOTE — Telephone Encounter (Signed)
Please advise

## 2015-01-16 NOTE — Telephone Encounter (Signed)
This is crap!    Pt was seen by Dr Luiz Blare and Eldridge Dace c him there-he felt there was no cardiac issue and needed no further card eval.    Vasc consult did not feel there was a serious prob c cicculation, but should folloup c Dr Lucendia Herrlich in the office.    She wasn't "booted" out of anywhere- her dismissal was appropriate.

## 2015-01-17 NOTE — Telephone Encounter (Signed)
LM to schedule appt

## 2015-01-17 NOTE — Telephone Encounter (Signed)
-----   Message from Murvin Donning, [place] sent at 01/16/2015  4:44 PM EST -----      ----- Message -----     From: Arnette Felts     Sent: 01/16/2015   4:13 PM       To: [facility] Clinical Staff     [facility] np referred by Dr. Kennon Holter testing done [facility] artery aneurysm adrenal mass. Very concerned wants to go to [facility] can we get her in sooner.

## 2015-01-17 NOTE — Care Coordination-Inpatient (Signed)
LVM for patient reminding her of appointment with Earney Hamburg, MS, RD, LD at 12:30 pm at Dr. Musil Mince office.

## 2015-01-18 NOTE — Telephone Encounter (Signed)
Thank you Danae Chen.  Informed by [facility] this a.m. that pt has cxl'd RD appt r/t recent d/c from hospital.  RD will remain available for reschedule as pt requests.    Earney Hamburg, [place], LD  Care Coordination Team Dietitian  [phone]

## 2015-01-21 ENCOUNTER — Ambulatory Visit
Admit: 2015-01-21 | Discharge: 2015-01-21 | Payer: PRIVATE HEALTH INSURANCE | Attending: Vascular Surgery | Primary: Student in an Organized Health Care Education/Training Program

## 2015-01-21 DIAGNOSIS — I728 Aneurysm of other specified arteries: Secondary | ICD-10-CM

## 2015-01-21 NOTE — Telephone Encounter (Signed)
Left message for patient to call back.

## 2015-01-21 NOTE — Progress Notes (Signed)
[facility]  Consultation/History & Physical    Date of Admission:  (Not on file)  Date of Consultation:  01/21/2015    PCP:  Acquanetta Sit [place], MD     Chief Complaint:    Chief Complaint   Patient presents with   ??? New Patient           History of Present Illness:  Rachael Mays is a 59 y.o. 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  This was found on CT scan of the pulmonary arteries to rule out pulmonary embolus secondary to her presenting with chest discomfort.  It has been noted with increasing size and studies back in 2009.  She is referred here for further evaluation.  She complains of chest wall, shoulder and paresthesias of her upper extremities.  He has had a complicated history with lap band in the past.  He has noted a significant amount of weight gain over the last several months.      PMH:   has a past medical history of Anxiety; Asthma; Chronic back pain; Deep vein thrombosis; Depression; GERD (gastroesophageal reflux disease); Pulmonary embolism ([place]); Obesity; Hyperlipidemia; GERD (gastroesophageal reflux disease); and Hypertension.    PSH:   has past surgical history that includes Colonoscopy; Upper gastrointestinal endoscopy; Cesarean section; Cholecystectomy; Hysterectomy; shoulder surgery; Lap Band (05/01/08); back surgery; Colonoscopy (04/08/10); other surgical history; Upper gastrointestinal endoscopy (05/19/13); other surgical history (07/05/13); Upper gastrointestinal endoscopy (07/21/13); Upper gastrointestinal endoscopy (07/21/13); partial hysterectomy; and Upper gastrointestinal endoscopy (N/A, 07/31/2014).    Allergies:    Allergies   Allergen Reactions   ??? Morphine Itching   ??? Talwin [Pentazocine] Other (See Comments)     dizzy        Home Meds:    Prior to Admission medications    Medication Sig Start Date End Date Taking? Authorizing Provider   ondansetron (ZOFRAN) 4 MG tablet Take 1 tablet by mouth daily as needed for Nausea or Vomiting 01/13/15   Yes Kerry Dory, MD   metoclopramide (REGLAN) 5 MG tablet Take 1 tablet by mouth 4 times daily (before meals and nightly) 01/09/15  Yes Harvel Quale, MD   metaxalone [facility]) 800 MG tablet TAKE ONE TABLET BY MOUTH THREE TIMES A DAY 01/02/15  Yes Jamal Collin, MD   metaxalone [facility]) 800 MG tablet Take 1 tablet by mouth 3 times daily 11/28/14  Yes Harvel Quale, MD   nystatin (MYCOSTATIN) 100000 UNIT/GM powder Apply 3 times daily. 11/12/14  Yes Harvel Quale, MD   ALPRAZolam Duanne Moron) 0.5 MG tablet Take 1 tablet by mouth 2 times daily as needed for Anxiety 11/05/14  Yes Ulla Gallo Erle Crocker., MD   escitalopram [facility]) 20 MG tablet Take 1 tablet by mouth daily 08/27/14  Yes Carla Drape., MD   Multiple Vitamins-Minerals (MULTIVITAMIN PO) Take by mouth   Yes Historical Provider, MD   CALCIUM-VITAMIN D PO Take by mouth   Yes Historical Provider, MD   albuterol (PROVENTIL HFA) 108 (90 BASE) MCG/ACT inhaler Inhale 2 puffs into the lungs every 6 hours as needed for Wheezing 05/17/14  Yes Harvel Quale, MD   hydrochlorothiazide (HYDRODIURIL) 25 MG tablet Take 1 tablet by mouth daily 04/10/14  Yes Harvel Quale, MD   lisinopril (PRINIVIL;ZESTRIL) 10 MG tablet Take 1 tablet by mouth daily 04/10/14  Yes Harvel Quale, MD   montelukast (SINGULAIR) 10 MG tablet  TAKE ONE TABLET BY MOUTH DAILY 03/16/14  Yes Jamal Collin, MD   fluocinonide (LIDEX) 0.05 % cream Apply topically 2 times daily. 03/05/14  Yes Harvel Quale, MD   omeprazole (PRILOSEC) 40 MG capsule Take 1 capsule by mouth daily. 07/21/13  Yes Norwood Levo, MD   cetirizine (ZYRTEC ALLERGY) 10 MG tablet Take 10 mg by mouth daily.   Yes Historical Provider, MD        Hospital Meds:    Current Outpatient Prescriptions   Medication Sig Dispense Refill   ??? ondansetron (ZOFRAN) 4 MG tablet Take 1 tablet by mouth daily as needed for Nausea or Vomiting 30 tablet 0   ??? metoclopramide  (REGLAN) 5 MG tablet Take 1 tablet by mouth 4 times daily (before meals and nightly) 120 tablet 0   ??? metaxalone (SKELAXIN) 800 MG tablet TAKE ONE TABLET BY MOUTH THREE TIMES A DAY 30 tablet 1   ??? metaxalone (SKELAXIN) 800 MG tablet Take 1 tablet by mouth 3 times daily 30 tablet 0   ??? nystatin (MYCOSTATIN) 100000 UNIT/GM powder Apply 3 times daily. 3 Bottle 5   ??? ALPRAZolam (XANAX) 0.5 MG tablet Take 1 tablet by mouth 2 times daily as needed for Anxiety 60 tablet 2   ??? escitalopram (LEXAPRO) 20 MG tablet Take 1 tablet by mouth daily 30 tablet 6   ??? Multiple Vitamins-Minerals (MULTIVITAMIN PO) Take by mouth     ??? CALCIUM-VITAMIN D PO Take by mouth     ??? albuterol (PROVENTIL HFA) 108 (90 BASE) MCG/ACT inhaler Inhale 2 puffs into the lungs every 6 hours as needed for Wheezing 3 Inhaler 3   ??? hydrochlorothiazide (HYDRODIURIL) 25 MG tablet Take 1 tablet by mouth daily 90 tablet 3   ??? lisinopril (PRINIVIL;ZESTRIL) 10 MG tablet Take 1 tablet by mouth daily 90 tablet 3   ??? montelukast (SINGULAIR) 10 MG tablet TAKE ONE TABLET BY MOUTH DAILY 90 tablet 3   ??? fluocinonide (LIDEX) 0.05 % cream Apply topically 2 times daily. 1 Tube 1   ??? omeprazole (PRILOSEC) 40 MG capsule Take 1 capsule by mouth daily. 30 capsule 3   ??? cetirizine (ZYRTEC ALLERGY) 10 MG tablet Take 10 mg by mouth daily.       No current facility-administered medications for this visit.       Social History:       History     Social History   ??? Marital Status: Married     Spouse Name: N/A     Number of Children: N/A   ??? Years of Education: N/A     Social History Main Topics   ??? Smoking status: Former Smoker -- 0.00 packs/day for 35 years     Types: Cigarettes     Quit date: 08/07/2012   ??? Smokeless tobacco: Never Used   ??? Alcohol Use: No   ??? Drug Use: No   ??? Sexual Activity: None     Other Topics Concern   ??? None     Social History Narrative    ** Merged History Encounter **            Family Histroy:      Family History   Problem Relation Age of Onset   ???  Arthritis Mother    ??? Cancer Mother    ??? Depression Mother    ??? High Blood Pressure Mother    ??? Heart Disease Maternal Grandmother    ??? Early Death Paternal Grandmother    ???  Heart Disease Paternal Grandfather    ??? Diabetes Other    ??? High Blood Pressure Other    ??? Obesity Other    ??? Heart Disease Mother 57     MI    ??? Ovarian Cancer Mother 86   ??? Cancer Father 42     colon   ??? Cancer Mother        Review of Systems:  ?? Constitutional: there has been no unanticipated weight loss. There's been no change in energy level, sleep pattern, or activity level.     ?? Eyes: No visual changes or diplopia. No scleral icterus.  ?? ENT: No Headaches, hearing loss or vertigo. No mouth sores or sore throat.  ?? Cardiovascular: Reviewed in HPI  ?? Respiratory: No cough or wheezing, no sputum production. No hematemesis.    ?? Gastrointestinal: No abdominal pain, appetite loss, blood in stools. No change in bowel or bladder habits.  ?? Genitourinary: No dysuria, trouble voiding, or hematuria.  ?? Musculoskeletal:  No gait disturbance, weakness or joint complaints.  ?? Integumentary: No rash or pruritis.  ?? Neurological: No headache, diplopia, change in muscle strength, numbness or tingling. No change in gait, balance, coordination, mood, affect, memory, mentation, behavior.  ?? Psychiatric: No anxiety, no depression.  ?? Endocrine: No malaise, fatigue or temperature intolerance. No excessive thirst, fluid intake, or urination. No tremor.  ?? Hematologic/Lymphatic: No abnormal bruising or bleeding, blood clots or swollen lymph nodes.  ?? Allergic/Immunologic: No nasal congestion or hives.        Physical Exam   Vital Signs: BP 158/78 mmHg   Ht 5\' 6"  (1.676 m)   Wt 294 lb (133.358 kg)   BMI 47.48 kg/m2       Admission Weight: 294 lb (133.358 kg)     General appearance: alert, appears stated age, cooperative and no distress  Head: Normocephalic, without obvious abnormality, atraumatic  Eyes: conjunctivae/corneas clear. PERRL, EOM's intact. Fundi  benign  Neck: no adenopathy, no carotid bruit, no JVD, supple, symmetrical, trachea midline and thyroid: not enlarged, symmetric, no tenderness/mass/nodules  Lungs: clear to auscultation bilaterally  Heart: regular rate and rhythm  Abdomen: soft, non-tender. Bowel sounds normal. No masses,  no organomegaly  Extremities: extremities normal, atraumatic, no cyanosis or edema  Neurologic: Grossly normal     MEDICAL DECISION MAKING/TESTING    CT Scan:      Pulmonary Arteries: Evaluation is limited by motion artifact. No definite   pulmonary embolus is identified.      Mediastinum: No evidence of mediastinal lymphadenopathy. The heart and   pericardium demonstrate no acute abnormality. There is no acute abnormality   of the thoracic aorta.      Lungs/pleura: The lungs are without acute process. No focal consolidation or   pulmonary edema. No evidence of pleural effusion or pneumothorax.      Upper Abdomen: Fatty infiltration of the liver is again noted. Right adrenal   gland nodule is again seen. A hypervascular soft tissue nodule measuring 1.6   x 1.9 cm is again seen anterior to the left adrenal gland. 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      Soft Tissues/Bones: No acute bone or soft tissue abnormality.             Labs:    BMP:   Lab Results   Component Value Date    NA 140 01/11/2015    K 3.9 01/11/2015  CL 100 01/11/2015    CO2 27 01/11/2015    PHOS 3.8 08/21/2013    BUN 14 01/11/2015    CREATININE 0.8 01/11/2015    CREATININE 1.2 05/04/2014      No components found for: GLU  Lab Results   Component Value Date    MG 2.10 01/11/2015      CBC:   Lab Results   Component Value Date    WBC 9.1 01/11/2015    HGB 14.3 01/11/2015    HCT 43.5 01/11/2015    MCV 87.8 01/11/2015    PLT 193 01/11/2015      Coagulation:   Lab Results   Component Value Date    INR 0.93 01/11/2015    INR 0.95 05/15/2011    APTT 30.0 01/11/2015    APTT 28.3 05/15/2011     Cardiac markers:   Lab Results    Component Value Date    CKTOTAL 189 01/11/2015    TROPONINI <0.01 01/12/2015    TROPONINI 0.00 05/04/2014    TROPONINI <0.01 05/15/2011     Lab Results   Component Value Date    LABA1C 5.2 03/26/2010    No results found for: ALB    Lab Results   Component Value Date    BILITOT 0.3 07/16/2014    BILIDIR <0.2 07/16/2014    AST 49 07/16/2014    ALT 60 07/16/2014    ALKPHOS 100 07/16/2014      Lab Results   Component Value Date    CHOL 208 05/04/2014    HDL 49 05/04/2014    HDL 52 03/31/2011    LDLCALC 132 05/04/2014    TRIG 137 05/04/2014          Diagnosis:  Patient Active Problem List   Diagnosis   ??? Back Pain   ??? Deep Vein Thrombosis   ??? Depression   ??? Hyperlipidemia   ??? Pulmonary Embolism   ??? Nausea & vomiting   ??? Abdominal  pain, other specified site   ??? Morbid obesity ([place])   ??? Status post gastric banding   ??? Vertigo   ??? Dizziness   ??? Tinnitus, subjective   ??? Complication of gastric banding   ??? HTN (hypertension)   ??? Hyperglycemia   ??? Chest pain   ??? 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 Adrenal mass, left ([place])           Plan:  Patient has a small 1.8centimeter nodule anterior to the left adrenal gland.  It is unclear if this is related to her visceral branches aneurysm versus a nodule of the adrenal gland.  Would recommend analysis to ensure this is not functional tumor such as a pheochromocytoma.  If lab evaluation is negative, would recommend selective visceral angiogram to try and further identify and potentially treat.      Deletha Jaffee B. Lucendia Herrlich M.D., FACS.  01/21/2015  10:24 AM

## 2015-01-21 NOTE — Telephone Encounter (Signed)
Patient has adrenal mass vs small aneurysm.  Discussed with Dr. Lucendia Herrlich.  He would like endocrine w/u before vascular evaluation.  Patient needs to see Dr. Nicole Cella for this.  Please inform patient and refer.

## 2015-01-22 NOTE — Telephone Encounter (Signed)
Need to speak to Dr [facility] Thursday.

## 2015-01-23 NOTE — Care Coordination-Inpatient (Signed)
[facility] IP Discharge Follow up Call - Subsequent    Reason for Hospital/ED visit:  abd pain  Discharged with Home Health?:  N/A      Status:     not changed    Review of Instructions:                                  Understands what to report/when to return?:  Yes              Following discharge instructions?:  Yes   If not why?     Is there any lingering symptoms? Yes continues with abd pain.  Is seeing surgery and endocrine for this  Are you eating and drinking OK? Yes  Any other problems i.e. Constipation, other symptoms?  No  Are there any new complaints of pain? No    Understands wound care regimen and has supplies/equipment? N/A  Are there any other complaints/concerns that you wish to tell your provider? Patient needs to see endocrine and was told she needs lab work done.  Wants to know if she should have this done prior to going to see  DR. Aktar or should she make appt and get lab slip from him?      FU appts/Provider:    Future Appointments  Date Time Provider [place]   01/28/2015 1:00 PM Jacquelyn Jerene Pitch, MD [facility] None       Tel-Assurance:  Currently active in Tel-Assurance   No  Has daily survey line phone number [phone])  N/A    Questions regarding Tel-Assurance  N/A      Medication Reconciliation:  Medication Reconciliation by phone - Yes  Understands Medications?  Yes  Taking Medications? Yes   If not taking medications why?   Can you swallow your pills?  Yes    Is the patient having any trouble with ADL's or IADL's? No  Needs Equipment?  No    Link to services in community?:  N/A              Which services:  main question is about labs prior to or after seeing endocrine?  PCP in office tomorrow.  Will call with answer at that time.

## 2015-01-24 NOTE — Telephone Encounter (Signed)
Labs per endocrine.  They will have very specific labs they want for thorough w/u.

## 2015-01-24 NOTE — Telephone Encounter (Signed)
No norco.

## 2015-01-24 NOTE — Telephone Encounter (Signed)
Notified pt Dr Murrell Converse denied Norco.  Pt wanted the request of Norco to be turned over to Dr Kennon Holter.  Provider please advise.

## 2015-01-24 NOTE — Telephone Encounter (Signed)
Needs to see endo but does not need to see me.

## 2015-01-24 NOTE — Care Coordination-Inpatient (Signed)
Called patient and LM that PCP is deferring to endocrine for labs as they will order specific and detailed labs.

## 2015-01-24 NOTE — Telephone Encounter (Signed)
Pt is scheduled for 3/22 with Dr Nicole Cella.  She says she is having a lot of pain and wants to know if you will refill Norco.

## 2015-01-24 NOTE — Telephone Encounter (Signed)
Not my pt

## 2015-01-25 NOTE — Care Coordination-Inpatient (Signed)
Patient called to express dissatisfaction with PCP.  States she is frustrated and feels that PCP is not listening to her or helping her manage her s/s.  States she is going to change doctors.  Allowed patient to vent frustrations.  Advised her that Dr. Kennon Holter will not write RX for her as he only saw her in the hospital.  Explained that [facility] has talked with PCP and she is adamant that patient not get pain medicine through her.  Offered to contact CC at new office if patient decides to change PCP's to facilitate smooth transition and continued assistance.

## 2015-01-28 ENCOUNTER — Encounter: Attending: Surgery | Primary: Student in an Organized Health Care Education/Training Program

## 2015-01-29 NOTE — Telephone Encounter (Signed)
Left message for patient with details.

## 2015-01-31 NOTE — Care Coordination-Inpatient (Signed)
[facility] IP Discharge Follow up Call - Subsequent    Reason for Hospital/ED visit:  abd pain, chest pain  Discharged with Home Health?:  No      Status:     not changed    Review of Instructions:                                  Understands what to report/when to return?:  Yes              Following discharge instructions?:  Yes   If not why?     Is there any lingering symptoms? Yes continues with abd pain  Are you eating and drinking OK? No: having pain with food intake.  Taking zofran AC  Any other problems i.e. Constipation, other symptoms?  No  Are there any new complaints of pain? No    Understands wound care regimen and has supplies/equipment? N/A  Are there any other complaints/concerns that you wish to tell your provider?      FU appts/Provider:    Future Appointments  Date Time Provider [place]   02/26/2015 1:00 PM Lynder Parents, MD [facility] Endo [facility]   02/28/2015 2:30 PM [place] Olivia Canter, CNP [facility]       Tel-Assurance:  Currently active in Tel-Assurance   No  Has daily survey line phone number [phone])  N/A    Questions regarding Tel-Assurance  N/A      Medication Reconciliation:  Medication Reconciliation by phone - Yes  Understands Medications?  Yes  Taking Medications? Yes   If not taking medications why?   Can you swallow your pills?  Yes    Is the patient having any trouble with ADL's or IADL's? No  Needs Equipment?  No    Link to services in community?:  N/A              Which services:  patient reports she is going to see another PCP to see if she can "start over". She feels like she is not getting her needs met at her current practice.  She is not changing practices yet, but will wait until she sees this new practitioner prior to finalizing decision.  denies any other needs.  Advised patient that if she does decide to change that there is RNCC at the new office also and care will be transitioned to her.

## 2015-02-06 NOTE — Care Coordination-Inpatient (Signed)
Called to follow up with patient about current health issues. No answer. Left general message with call back phone number

## 2015-02-11 ENCOUNTER — Encounter

## 2015-02-11 MED ORDER — FLUOCINONIDE 0.05 % EX CREA
0.05 % | CUTANEOUS | Status: DC
Start: 2015-02-11 — End: 2015-11-21

## 2015-02-26 ENCOUNTER — Ambulatory Visit
Admit: 2015-02-26 | Discharge: 2015-02-26 | Payer: PRIVATE HEALTH INSURANCE | Attending: "Endocrinology | Primary: Student in an Organized Health Care Education/Training Program

## 2015-02-26 DIAGNOSIS — E278 Other specified disorders of adrenal gland: Secondary | ICD-10-CM

## 2015-02-26 MED ORDER — DEXAMETHASONE 1 MG PO TABS
1 MG | ORAL_TABLET | Freq: Once | ORAL | Status: AC
Start: 2015-02-26 — End: 2015-02-26

## 2015-02-26 NOTE — Progress Notes (Signed)
Subjective:      59 y/o WF who is here for adrenal evaluation.     She has a h/o right adrenal nodule found as incidental finding. She has had multiple CT scan for other reasons.    CT 8/14  Showed right 1.6cm nodule stable since 09  CT 10/14 Stable  CT 4/15 Adrenal nodule not mentioned  CT 6/15 Stable right adrenal nodule  CT 2/16    Right adrenal   gland nodule is again seen. A hypervascular soft tissue nodule measuring 1.6   x 1.9 cm is again seen anterior to the left adrenal gland. 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     She has h/o HTN , on lisinopril and HCTZ  HTN for 30 years    No h/o DM    H/o Lap band, gained 90 lb after lab band was removed. No DM. No fractures.  14lb gain in last 6 months    Reports episodes of palpitations, once a month, + tremors, no sweats, + nervousness    Past Medical History   Diagnosis Date   ??? Anxiety    ??? Asthma    ??? Chronic back pain    ??? Deep vein thrombosis    ??? Depression    ??? GERD (gastroesophageal reflux disease)      NO LONGER SINCE LAP   ??? Pulmonary embolism (HCC)    ??? Obesity      hx of; had lap band   ??? Hyperlipidemia      hx; resolved with lap band   ??? GERD (gastroesophageal reflux disease) 01/23/2009   ??? Hypertension      Past Surgical History   Procedure Laterality Date   ??? Colonoscopy     ??? Upper gastrointestinal endoscopy     ??? Cesarean section     ??? Cholecystectomy     ??? Hysterectomy     ??? Shoulder surgery     ??? Lap band  05/01/08     Dr. Nicole Cella   ??? Back surgery       neck  plates   ??? Colonoscopy  04/08/10   ??? Other surgical history       [place] Filter: curently in place   ??? Upper gastrointestinal endoscopy  05/19/13   ??? Other surgical history  07/05/13     lap band removal   ??? Upper gastrointestinal endoscopy  07/21/13     ESOPHAGEAL STENT PLACEMENT   ??? Upper gastrointestinal endoscopy  07/21/13     WITH EXCHANGE OF ESOPHAGEAL STENT   ??? Partial hysterectomy     ??? Upper gastrointestinal endoscopy N/A 07/31/2014      Esophagogastroduodenoscopy with esophageal balloon dilation     Current Outpatient Prescriptions   Medication Sig Dispense Refill   ??? fluocinonide (LIDEX) 0.05 % cream Apply topically 2 times daily. 1 Tube 1   ??? ondansetron (ZOFRAN) 4 MG tablet Take 1 tablet by mouth daily as needed for Nausea or Vomiting 30 tablet 0   ??? metoclopramide (REGLAN) 5 MG tablet Take 1 tablet by mouth 4 times daily (before meals and nightly) 120 tablet 0   ??? metaxalone (SKELAXIN) 800 MG tablet TAKE ONE TABLET BY MOUTH THREE TIMES A DAY 30 tablet 1   ??? nystatin (MYCOSTATIN) 100000 UNIT/GM powder Apply 3 times daily. 3 Bottle 5   ??? ALPRAZolam (XANAX) 0.5 MG tablet Take 1 tablet by mouth 2 times daily as needed  for Anxiety 60 tablet 2   ??? escitalopram (LEXAPRO) 20 MG tablet Take 1 tablet by mouth daily 30 tablet 6   ??? Multiple Vitamins-Minerals (MULTIVITAMIN PO) Take by mouth     ??? CALCIUM-VITAMIN D PO Take by mouth     ??? albuterol (PROVENTIL HFA) 108 (90 BASE) MCG/ACT inhaler Inhale 2 puffs into the lungs every 6 hours as needed for Wheezing 3 Inhaler 3   ??? hydrochlorothiazide (HYDRODIURIL) 25 MG tablet Take 1 tablet by mouth daily 90 tablet 3   ??? lisinopril (PRINIVIL;ZESTRIL) 10 MG tablet Take 1 tablet by mouth daily 90 tablet 3   ??? montelukast (SINGULAIR) 10 MG tablet TAKE ONE TABLET BY MOUTH DAILY 90 tablet 3   ??? omeprazole (PRILOSEC) 40 MG capsule Take 1 capsule by mouth daily. 30 capsule 3   ??? cetirizine (ZYRTEC ALLERGY) 10 MG tablet Take 10 mg by mouth daily.       No current facility-administered medications for this visit.       Review of Systems  Please see scanned     Objective:    There were no vitals taken for this visit.  Wt Readings from Last 3 Encounters:   01/21/15 294 lb (133.358 kg)   01/13/15 291 lb 11.2 oz (132.314 kg)   12/24/14 287 lb (130.182 kg)       Constitutional: Well-developed, appears stated age, cooperative, in no acute distress  H/E/N/M/T:atraumatic, normocephalic, external ears, nose, lips normal without  lesions  Eyes: Lids, lashes, conjunctivae and sclerae normal, No proptosis, no lid lag  Neck: supple, symmetrical, trachea midline   Thyroid: gland size normal, non-tender on palpation  Respiratory: clear to auscultation bilaterally and breathing is unlabored  Cardiovascular: regular rate and rhythm, S1, S2, regular rate and rhythm, no murmur, rub or gallop.   Skin: No obvious rashes or lesions present.  Skin and hair texture normal  Psychiatric: Judgement and Insight:  judgement and insight appear normal  Neuro: Normal without focal findings, speech is normal normal, speech is spontaneous    Lab Review  Lab Results   Component Value Date    TSH 3.76 04/17/2014    TSH 1.42 03/31/2011     No results found for: FREET4      Assessment:     1.Adrenal Nodule: She has a right 1.6cm adrenal nodule, stable since 09. 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 Will order hormone evaluation for adrenal nodule  2.Celiac Artery Aneurysm  3.H/o Lap Band  4.HTN  5.H/o DVT/PE     Plan:     1.Check plasma metanephrine  2.1mg  Dex Suppression, Renin and Aldosterone level  3.Call patient with results

## 2015-02-26 NOTE — Patient Instructions (Signed)
Take dexamethasone at midnight  Labs the next morning at 8am

## 2015-02-28 ENCOUNTER — Encounter: Attending: Family | Primary: Student in an Organized Health Care Education/Training Program

## 2015-02-28 LAB — CORTISOL AM, TOTAL: Cortisol - AM: 4.2 ug/dL — ABNORMAL LOW (ref 4.3–22.4)

## 2015-03-01 LAB — RENIN: Renin Activity: 0.5 ng/mL/hr

## 2015-03-01 LAB — ALDOSTERONE: Aldosterone: 11.1 ng/dL

## 2015-03-03 LAB — METANEPHRINES PLASMA FREE
Metanephrine, Free: 0.16 nmol/L (ref 0.00–0.49)
Normetanephrine, Free: 0.38 nmol/L (ref 0.00–0.89)

## 2015-03-04 NOTE — Addendum Note (Signed)
Addended by: Lynder Parents on: 03/04/2015 12:54 PM     Modules accepted: Level of Service

## 2015-03-04 NOTE — Progress Notes (Signed)
3/16 MN / NMN normal  Ald/renin normal  1mg  Dex  Cortisol level 4.2  No evidence of hormonal hypersecretion, patient can have angiogram if needed per CT surgery as pheo ruled out

## 2015-03-18 MED ORDER — METOCLOPRAMIDE HCL 5 MG PO TABS
5 MG | ORAL_TABLET | ORAL | Status: DC
Start: 2015-03-18 — End: 2015-06-03

## 2015-04-08 ENCOUNTER — Ambulatory Visit
Admit: 2015-04-08 | Discharge: 2015-04-08 | Payer: PRIVATE HEALTH INSURANCE | Attending: Vascular Surgery | Primary: Student in an Organized Health Care Education/Training Program

## 2015-04-08 DIAGNOSIS — I728 Aneurysm of other specified arteries: Secondary | ICD-10-CM

## 2015-04-08 LAB — CBC
Hematocrit: 42.9 % (ref 36.0–48.0)
Hemoglobin: 14.1 g/dL (ref 12.0–16.0)
MCH: 28.2 pg (ref 26.0–34.0)
MCHC: 32.9 g/dL (ref 31.0–36.0)
MCV: 85.8 fL (ref 80.0–100.0)
MPV: 9 fL (ref 5.0–10.5)
Platelets: 188 10*3/uL (ref 135–450)
RBC: 5 M/uL (ref 4.00–5.20)
RDW: 14.2 % (ref 12.4–15.4)
WBC: 8.2 10*3/uL (ref 4.0–11.0)

## 2015-04-08 LAB — BASIC METABOLIC PANEL
Anion Gap: 13 (ref 3–16)
BUN: 18 mg/dL (ref 7–20)
CO2: 27 mmol/L (ref 21–32)
Calcium: 9.8 mg/dL (ref 8.3–10.6)
Chloride: 102 mmol/L (ref 99–110)
Creatinine: 0.9 mg/dL (ref 0.6–1.1)
GFR African American: >60 (ref >60)
GFR Non-African American: >60 (ref >60)
Glucose: 107 mg/dL — ABNORMAL HIGH (ref 70–99)
Potassium: 4 mmol/L (ref 3.5–5.1)
Sodium: 142 mmol/L (ref 136–145)

## 2015-04-08 LAB — PROTIME-INR
INR: 0.95 (ref 0.85–1.16)
Protime: 10.8 s (ref 9.8–13.0)

## 2015-04-08 LAB — APTT: aPTT: 30.2 s (ref 25.2–36.4)

## 2015-04-08 MED ORDER — NYSTOP 100000 UNIT/GM EX POWD
100000 UNIT/GM | CUTANEOUS | Status: DC
Start: 2015-04-08 — End: 2016-03-23

## 2015-04-08 NOTE — Progress Notes (Signed)
[facility]   Vascular Surgery Followup    Referring Provider:  Acquanetta Sit SLOUGH, MD     Chief Complaint   Patient presents with   ??? Follow-up        History of Present Illness:   patient here today for follow-up with concerns for a small aneurysm off a branch of the celiac artery.  Hypervascular workup for an active adrenal was negative.  No other new complaints with the exception of persistent fatigue.     Past Medical History:   has a past medical history of Anxiety; Asthma; Chronic back pain; Deep vein thrombosis; Depression; GERD (gastroesophageal reflux disease); Pulmonary embolism ([place]); Obesity; Hyperlipidemia; GERD (gastroesophageal reflux disease); and Hypertension.    Surgical History:   has past surgical history that includes Colonoscopy; Upper gastrointestinal endoscopy; Cesarean section; Cholecystectomy; Hysterectomy; shoulder surgery; Lap Band (05/01/08); back surgery; Colonoscopy (04/08/10); other surgical history; Upper gastrointestinal endoscopy (05/19/13); other surgical history (07/05/13); Upper gastrointestinal endoscopy (07/21/13); Upper gastrointestinal endoscopy (07/21/13); partial hysterectomy; and Upper gastrointestinal endoscopy (N/A, 07/31/2014).     Social History:   reports that she quit smoking about 2 years ago. Her smoking use included Cigarettes. She smoked 0.00 packs per day for 35 years. She has never used smokeless tobacco. She reports that she does not drink alcohol or use illicit drugs.     Family History:  family history includes Arthritis in her mother; Cancer in her mother and mother; Cancer (age of onset: 33) in her father; Depression in her mother; Diabetes in an other family member; Early Death in her paternal grandmother; Heart Disease in her maternal grandmother and paternal grandfather; Heart Disease (age of onset: 10) in her mother; High Blood Pressure in her mother and another family member; Obesity in an other family member; Ovarian Cancer (age of onset: 48) in  her mother.     Home Medications:  Current Outpatient Prescriptions   Medication Sig Dispense Refill   ??? metoclopramide (REGLAN) 5 MG tablet TAKE ONE TABLET BY MOUTH FOUR TIMES A DAY BEFORE MEALS AND NIGHTLY 120 tablet 0   ??? fluocinonide (LIDEX) 0.05 % cream Apply topically 2 times daily. 1 Tube 1   ??? ondansetron (ZOFRAN) 4 MG tablet Take 1 tablet by mouth daily as needed for Nausea or Vomiting 30 tablet 0   ??? metaxalone (SKELAXIN) 800 MG tablet TAKE ONE TABLET BY MOUTH THREE TIMES A DAY 30 tablet 1   ??? nystatin (MYCOSTATIN) 100000 UNIT/GM powder Apply 3 times daily. 3 Bottle 5   ??? ALPRAZolam (XANAX) 0.5 MG tablet Take 1 tablet by mouth 2 times daily as needed for Anxiety 60 tablet 2   ??? escitalopram (LEXAPRO) 20 MG tablet Take 1 tablet by mouth daily 30 tablet 6   ??? Multiple Vitamins-Minerals (MULTIVITAMIN PO) Take by mouth     ??? CALCIUM-VITAMIN D PO Take by mouth     ??? albuterol (PROVENTIL HFA) 108 (90 BASE) MCG/ACT inhaler Inhale 2 puffs into the lungs every 6 hours as needed for Wheezing 3 Inhaler 3   ??? hydrochlorothiazide (HYDRODIURIL) 25 MG tablet Take 1 tablet by mouth daily 90 tablet 3   ??? lisinopril (PRINIVIL;ZESTRIL) 10 MG tablet Take 1 tablet by mouth daily 90 tablet 3   ??? montelukast (SINGULAIR) 10 MG tablet TAKE ONE TABLET BY MOUTH DAILY 90 tablet 3   ??? omeprazole (PRILOSEC) 40 MG capsule Take 1 capsule by mouth daily. 30 capsule 3   ??? cetirizine (ZYRTEC ALLERGY) 10 MG tablet Take 10  mg by mouth daily.       No current facility-administered medications for this visit.       Allergies:  Morphine and Talwin     Review of Systems:   ?? Constitutional: there has been no unanticipated weight loss. There's been no change in energy level, sleep pattern, or activity level.     ?? Eyes: No visual changes or diplopia. No scleral icterus.  ?? ENT: No Headaches, hearing loss or vertigo. No mouth sores or sore throat.  ?? Cardiovascular: Reviewed in HPI  ?? Respiratory: No cough or wheezing, no sputum production. No  hematemesis.    ?? Gastrointestinal: No abdominal pain, appetite loss, blood in stools. No change in bowel or bladder habits.  ?? Genitourinary: No dysuria, trouble voiding, or hematuria.  ?? Musculoskeletal:  No gait disturbance, weakness or joint complaints.  ?? Integumentary: No rash or pruritis.  ?? Neurological: No headache, diplopia, change in muscle strength, numbness or tingling. No change in gait, balance, coordination, mood, affect, memory, mentation, behavior.  ?? Psychiatric: No anxiety, no depression.  ?? Endocrine: No malaise, fatigue or temperature intolerance. No excessive thirst, fluid intake, or urination. No tremor.  ?? Hematologic/Lymphatic: No abnormal bruising or bleeding, blood clots or swollen lymph nodes.  ?? Allergic/Immunologic: No nasal congestion or hives.    Physical Examination:    Filed Vitals:    04/08/15 1031   BP: 128/78          General appearance: alert, appears stated age, cooperative and no distress  Head: Normocephalic, without obvious abnormality, atraumatic  Eyes: conjunctivae/corneas clear. PERRL, EOM's intact. Fundi benign  Neck: no adenopathy, no carotid bruit, no JVD, supple, symmetrical, trachea midline and thyroid: not enlarged, symmetric, no tenderness/mass/nodules  Lungs: clear to auscultation bilaterally  Heart: regular rate and rhythm  Abdomen: soft, non-tender. Bowel sounds normal. No masses,  no organomegaly  Extremities: extremities normal, atraumatic, no cyanosis or edema    Assessment:     Patient Active Problem List   Diagnosis   ??? Back Pain   ??? Deep Vein Thrombosis   ??? Depression   ??? Hyperlipidemia   ??? Pulmonary Embolism   ??? Nausea & vomiting   ??? Abdominal  pain, other specified site   ??? Morbid obesity ([place])   ??? Status post gastric banding   ??? Vertigo   ??? Dizziness   ??? Tinnitus, subjective   ??? Complication of gastric banding   ??? HTN (hypertension)   ??? Hyperglycemia   ??? Chest pain   ??? 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 Adrenal mass, left ([place])   ??? Adrenal nodule (HCC)        Plan:  Have recommended visceral angiogram with dedication to the celiac artery and its branches.  Hopes to identify and rule out this as being an aneurysm.  I did discuss with her possible coiling.    Thank you for allowing me to participate in the care of this individual.  Please do not hesitate to contact me with any questions.    Bridgit Eynon B. Lucendia Herrlich M.D., FACS.  04/08/2015  10:32 AM

## 2015-04-15 MED ORDER — MONTELUKAST SODIUM 10 MG PO TABS
10 MG | ORAL_TABLET | ORAL | Status: DC
Start: 2015-04-15 — End: 2015-07-30

## 2015-04-24 ENCOUNTER — Inpatient Hospital Stay
Admit: 2015-04-24 | Discharge: 2015-04-29 | Payer: PRIVATE HEALTH INSURANCE | Attending: Vascular Surgery | Primary: Student in an Organized Health Care Education/Training Program

## 2015-04-24 DIAGNOSIS — I728 Aneurysm of other specified arteries: Secondary | ICD-10-CM

## 2015-04-24 MED ORDER — MIDAZOLAM HCL 2 MG/2ML IJ SOLN
2 MG/ML | INTRAMUSCULAR | Status: AC
Start: 2015-04-24 — End: ?

## 2015-04-24 MED ORDER — FENTANYL CITRATE (PF) 100 MCG/2ML IJ SOLN
100 MCG/2ML | INTRAMUSCULAR | Status: AC
Start: 2015-04-24 — End: ?

## 2015-04-24 MED ORDER — SODIUM CHLORIDE 0.9 % IV SOLN
0.9 % | INTRAVENOUS | Status: AC
Start: 2015-04-24 — End: ?

## 2015-04-24 MED ORDER — LIDOCAINE HCL (PF) 1 % IJ SOLN
1 % | INTRAMUSCULAR | Status: AC
Start: 2015-04-24 — End: ?

## 2015-04-24 MED ORDER — HEPARIN (PORCINE) IN NACL 2-0.9 UNIT/ML-% IJ SOLN
INTRAMUSCULAR | Status: AC
Start: 2015-04-24 — End: ?

## 2015-04-24 MED FILL — FENTANYL CITRATE (PF) 100 MCG/2ML IJ SOLN: 100 MCG/2ML | INTRAMUSCULAR | Qty: 2

## 2015-04-24 MED FILL — MIDAZOLAM HCL 2 MG/2ML IJ SOLN: 2 MG/ML | INTRAMUSCULAR | Qty: 2

## 2015-04-24 MED FILL — HEPARIN (PORCINE) IN NACL 2-0.9 UNIT/ML-% IJ SOLN: INTRAMUSCULAR | Qty: 500

## 2015-04-24 MED FILL — LIDOCAINE HCL (PF) 1 % IJ SOLN: 1 % | INTRAMUSCULAR | Qty: 30

## 2015-04-24 MED FILL — SODIUM CHLORIDE 0.9 % IV SOLN: 0.9 % | INTRAVENOUS | Qty: 1000

## 2015-04-24 NOTE — Op Note (Signed)
PATIENT NAME:                 PA #:            MR #[id number], VOGT                 [id number]       [id number]            SURGEON:                              SURG DATE:  DIS DATE:          Damien Fusi II, MD            04/24/2015                     DATE OF BIRTH:   AGE:           PATIENT TYPE:     RM #:              1956-06-02       58             OIF               [facility]                   PREOPERATIVE DIAGNOSIS:  Celiac artery branch aneurysm.     POSTOPERATIVE DIAGNOSIS:  Celiac artery branch aneurysm.     PROCEDURE:  Abdominal aortogram with selective celiac artery angiography.     ANESTHESIA:  Local/IV.     ESTIMATED BLOOD LOSS:  Minimal.     COMPLICATIONS:  None.       INDICATION:  The patient is 59 year old white female who, on CT scan, was  noted to have concerns for a 1.6-cm aneurysm near the left adrenal artery and  presents for further evaluation and possible intervention.  All risks,  benefits, and alternatives were discussed and all questions were answered.     DESCRIPTION OF PROCEDURE:  After witnessed, informed consent was obtained the  patient was brought to the catheterization lab were IV sedation was  administered.  Her right groin was carefully prepped and draped.  Lidocaine,  1%, was infiltrated locally into the area overlying the right common femoral  artery and using a 4-French micropuncture kit access was obtained without  difficulty using ultrasound guidance.  A Bentson wire was introduced and a  5-French sheath was placed.  An Omni Flush catheter was inserted to the level  of the supraceliac aorta and an abdominal aortogram was performed.  A renal  SOS catheter was inserted and selected the celiac artery and a glidewire was  advanced out into the celiac artery, specifically the splenic branch.  The  catheter was removed and a Glidecath was advanced and selective angiograms  were performed inside the splenic artery with good visualization of the area  near the adrenal  as well as the kidney, both on selective angiography and  routine aortography.  No appreciable aneurysm was able to be identified.   There was tortuosity seen and double density may cause a similar appearance  on CT scan.  A 5-French Mynx was placed in the right groin.  The patient  tolerated the procedure well and was transferred to the recovery room in  stable  condition.     FINDINGS:  Normal celiac, SMA, and renal arteriogram if renal abdominal aorta  is normal.  Bilateral common iliac arteries that are visualized appear to be  normal.                                            Damien Fusi II, MD     [phone]  DD: 04/24/2015 10:10   DT: 04/24/2015 11:31   Job #: [id number]  CC: Damien Fusi II, MD  CC: Carma Lair, MD

## 2015-04-24 NOTE — Brief Op Note (Signed)
Brief Postoperative Note    Rachael Mays  Date of Birth:  1955/12/10  [id number]    Pre-operative Diagnosis: branch celiac artery aneurysm    Post-operative Diagnosis: Same    Procedure: abdominal aortogram with selective celiac artery aneurysm    Anesthesia: Local    Surgeons/Assistants: fries    Estimated Blood Loss: less than 50     Complications: None    Specimens: Was Not Obtained    Findings: no aneurysm identified    Electronically signed by Ulyses Jarred, MD on 04/24/2015 at 10:06 AM

## 2015-04-24 NOTE — H&P (Signed)
H&P Update    I have reviewed the history and physical and examined the patient and find no relevant changes.   I have reviewed with the patient and/or family the risks, benefits, and alternatives to the procedure.    Pre-sedation Assessment    Patient:  Rachael Mays   DOB:   04/11/1956  Intended Procedure:  Abdominal aortogram with lower extremity evaluation and possible intervention.      Margaretmary Bayley nurses notes reviewed and agreed.  Medications reviewed  Allergies:   Allergies   Allergen Reactions   ??? Morphine Itching   ??? Talwin [Pentazocine] Other (See Comments)     dizzy         Pre-Procedure Assessment/Plan:  ASA 2 - Patient with mild systemic disease with no functional limitations    Level of Sedation Plan:Mild sedation    Post Procedure plan: Return to same level of care      Signed:  Domenick Bookbinder II, 04/24/2015, 9:09 AM

## 2015-04-24 NOTE — Other (Signed)
Patient Acct Nbr:  [id number]  Primary AUTH/CERT:    [place] Name:   [employer]/PREFERRED PPO/UMR/BUTL  Primary Insurance Plan Name:  [employer] - [place] [place]  Primary Insurance Group Number:  [id number]  Primary Insurance Plan Type: N  Primary Insurance Policy Number:  [id number]

## 2015-04-26 NOTE — Progress Notes (Signed)
No answer to follow up call.

## 2015-05-03 ENCOUNTER — Emergency Department
Admit: 2015-05-03 | Payer: PRIVATE HEALTH INSURANCE | Primary: Student in an Organized Health Care Education/Training Program

## 2015-05-03 ENCOUNTER — Inpatient Hospital Stay: Admit: 2015-05-03 | Discharge: 2015-05-04 | Payer: PRIVATE HEALTH INSURANCE | Attending: Emergency Medicine

## 2015-05-03 DIAGNOSIS — R079 Chest pain, unspecified: Secondary | ICD-10-CM

## 2015-05-03 LAB — CBC
Hematocrit: 43.4 % (ref 36.0–48.0)
Hemoglobin: 13.9 g/dL (ref 12.0–16.0)
MCH: 27.7 pg (ref 26.0–34.0)
MCHC: 32.1 g/dL (ref 31.0–36.0)
MCV: 86.2 fL (ref 80.0–100.0)
MPV: 8.8 fL (ref 5.0–10.5)
Platelets: 171 10*3/uL (ref 135–450)
RBC: 5.03 M/uL (ref 4.00–5.20)
RDW: 14.7 % (ref 12.4–15.4)
WBC: 13.1 10*3/uL — ABNORMAL HIGH (ref 4.0–11.0)

## 2015-05-03 LAB — COMPREHENSIVE METABOLIC PANEL
ALT: 74 U/L — ABNORMAL HIGH (ref 10–40)
AST: 46 U/L — ABNORMAL HIGH (ref 15–37)
Albumin/Globulin Ratio: 0.9 — ABNORMAL LOW (ref 1.1–2.2)
Albumin: 3.7 g/dL (ref 3.4–5.0)
Alkaline Phosphatase: 90 U/L (ref 40–129)
Anion Gap: 13 (ref 3–16)
BUN: 14 mg/dL (ref 7–20)
CO2: 25 mmol/L (ref 21–32)
Calcium: 8.9 mg/dL (ref 8.3–10.6)
Chloride: 102 mmol/L (ref 99–110)
Creatinine: 0.7 mg/dL (ref 0.6–1.1)
GFR African American: >60 (ref >60)
GFR Non-African American: >60 (ref >60)
Globulin: 4 g/dL
Glucose: 119 mg/dL — ABNORMAL HIGH (ref 70–99)
Potassium: 4.2 mmol/L (ref 3.5–5.1)
Sodium: 140 mmol/L (ref 136–145)
Total Bilirubin: 0.9 mg/dL (ref 0.0–1.0)
Total Protein: 7.7 g/dL (ref 6.4–8.2)

## 2015-05-03 LAB — TROPONIN: Troponin: <0.01 ng/mL (ref <0.01)

## 2015-05-03 LAB — RAPID INFLUENZA A/B ANTIGENS
Rapid Influenza A Ag: NEGATIVE
Rapid Influenza B Ag: NEGATIVE

## 2015-05-03 MED ORDER — ONDANSETRON 4 MG PO TBDP
4 MG | Freq: Once | ORAL | Status: AC
Start: 2015-05-03 — End: 2015-05-03
  Administered 2015-05-03: 18:00:00 4 mg via ORAL

## 2015-05-03 MED ORDER — SODIUM CHLORIDE 0.9 % IV BOLUS
0.9 % | Freq: Once | INTRAVENOUS | Status: AC
Start: 2015-05-03 — End: 2015-05-03
  Administered 2015-05-03: 21:00:00 1000 mL via INTRAVENOUS

## 2015-05-03 MED ORDER — SODIUM CHLORIDE 0.9 % IV BOLUS
0.9 % | Freq: Once | INTRAVENOUS | Status: AC
Start: 2015-05-03 — End: 2015-05-03

## 2015-05-03 MED ORDER — HYDROMORPHONE HCL 1 MG/ML IJ SOLN
1 MG/ML | INTRAMUSCULAR | Status: DC | PRN
Start: 2015-05-03 — End: 2015-05-04
  Administered 2015-05-03: 23:00:00 1 mg via INTRAVENOUS

## 2015-05-03 MED ORDER — ONDANSETRON HCL 4 MG/2ML IJ SOLN
4 MG/2ML | INTRAMUSCULAR | Status: DC | PRN
Start: 2015-05-03 — End: 2015-05-04
  Administered 2015-05-03: 23:00:00 4 mg via INTRAVENOUS

## 2015-05-03 MED ORDER — IOPAMIDOL 76 % IV SOLN
76 % | Freq: Once | INTRAVENOUS | Status: AC | PRN
Start: 2015-05-03 — End: 2015-05-03
  Administered 2015-05-03: 20:00:00 100 mL via INTRAVENOUS

## 2015-05-03 MED ORDER — NITROGLYCERIN 0.4 MG SL SUBL
0.4 MG | Freq: Once | SUBLINGUAL | Status: AC
Start: 2015-05-03 — End: 2015-05-03
  Administered 2015-05-03 (×2): 0.4 mg via SUBLINGUAL

## 2015-05-03 MED ORDER — NITROGLYCERIN 2 % TD OINT
2 % | Freq: Once | TRANSDERMAL | Status: AC
Start: 2015-05-03 — End: 2015-05-03
  Administered 2015-05-03: 21:00:00 1 [in_us] via TOPICAL

## 2015-05-03 MED ORDER — SODIUM CHLORIDE 0.9 % IV SOLN
0.9 % | INTRAVENOUS | Status: AC
Start: 2015-05-03 — End: 2015-05-03
  Administered 2015-05-03: 19:00:00 1000 via INTRAVENOUS

## 2015-05-03 MED ORDER — ASPIRIN 81 MG PO CHEW
81 MG | Freq: Once | ORAL | Status: AC
Start: 2015-05-03 — End: 2015-05-03
  Administered 2015-05-03: 18:00:00 324 mg via ORAL

## 2015-05-03 MED FILL — SODIUM CHLORIDE 0.9 % IV SOLN: 0.9 % | INTRAVENOUS | Qty: 1000

## 2015-05-03 MED FILL — ISOVUE-370 76 % IV SOLN: 76 % | INTRAVENOUS | Qty: 100

## 2015-05-03 MED FILL — NITROSTAT 0.4 MG SL SUBL: 0.4 MG | SUBLINGUAL | Qty: 25

## 2015-05-03 MED FILL — HYDROMORPHONE HCL 1 MG/ML IJ SOLN: 1 MG/ML | INTRAMUSCULAR | Qty: 1

## 2015-05-03 MED FILL — ONDANSETRON HCL 4 MG/2ML IJ SOLN: 4 MG/2ML | INTRAMUSCULAR | Qty: 2

## 2015-05-03 MED FILL — NITRO-BID 2 % TD OINT: 2 % | TRANSDERMAL | Qty: 1

## 2015-05-03 MED FILL — ONDANSETRON 4 MG PO TBDP: 4 MG | ORAL | Qty: 1

## 2015-05-03 MED FILL — ASPIRIN 81 MG PO CHEW: 81 MG | ORAL | Qty: 4

## 2015-05-03 NOTE — Telephone Encounter (Signed)
Called patient to discuss condition. Patient states she had chest pains and numbness and was short of breath , and her body was weak. I advised the patient to go to the Emergency Room /   Rachael Mays

## 2015-05-03 NOTE — ED Notes (Signed)
Pt placed on continuous pulse oximetry and telemetry monitoring. Pt placed on cycling blood pressure. Fall risk precautions in place, call light in reach, bed side table within reach, bed alarm on, will continue to monitor.        Guinevere Ferrari, RN  05/03/15 1916

## 2015-05-03 NOTE — ED Provider Notes (Signed)
HPI Comments: Rachael Mays is a 59 year old female who reports generalized weakness and myalgia "sore all over" since yesterday. She has some heavy left chest "pressure" that radiates to her LUE with some SOB. She has a remote history of PE. No history of CAD. She has a history of asthma, but denies productive cough or fever. Pain is not worse with movement or palpation. Patient had a coronary angiogram in 2013 which showed no coronary artery disease. She had a renal angiogram recently.     The history is provided by the patient.     BP 141/64 mmHg   Pulse 80   Temp(Src) 99.6 ??F (37.6 ??C)   Resp 19   Ht 5\' 6"  (1.676 m)   Wt 280 lb (127.007 kg)   BMI 45.21 kg/m2   SpO2 93%    I have reviewed the following from the nursing documentation:      Prior to Admission medications    Medication Sig Start Date End Date Taking? Authorizing Provider   montelukast (SINGULAIR) 10 MG tablet TAKE ONE TABLET BY MOUTH DAILY 04/15/15  Yes Jamal Collin, MD   nystatin 100000 UNIT/GM POWD APPLY 3 TIMES DAILY 04/08/15  Yes Harvel Quale, MD   metoclopramide (REGLAN) 5 MG tablet TAKE ONE TABLET BY MOUTH FOUR TIMES A DAY BEFORE MEALS AND NIGHTLY 03/18/15  Yes Harvel Quale, MD   fluocinonide (LIDEX) 0.05 % cream Apply topically 2 times daily. 02/11/15  Yes Harvel Quale, MD   ondansetron [facility]) 4 MG tablet Take 1 tablet by mouth daily as needed for Nausea or Vomiting 01/13/15  Yes Kerry Dory, MD   metaxalone [facility]) 800 MG tablet TAKE ONE TABLET BY MOUTH THREE TIMES A DAY 01/02/15  Yes Jamal Collin, MD   ALPRAZolam Duanne Moron) 0.5 MG tablet Take 1 tablet by mouth 2 times daily as needed for Anxiety 11/05/14  Yes Ulla Gallo Erle Crocker., MD   escitalopram (LEXAPRO) 20 MG tablet Take 1 tablet by mouth daily 08/27/14  Yes Carla Drape., MD   Multiple Vitamins-Minerals (MULTIVITAMIN PO) Take by mouth   Yes Historical Provider, MD   CALCIUM-VITAMIN D PO Take by mouth   Yes Historical Provider,  MD   albuterol (PROVENTIL HFA) 108 (90 BASE) MCG/ACT inhaler Inhale 2 puffs into the lungs every 6 hours as needed for Wheezing 05/17/14  Yes Harvel Quale, MD   hydrochlorothiazide (HYDRODIURIL) 25 MG tablet Take 1 tablet by mouth daily 04/10/14  Yes Harvel Quale, MD   lisinopril (PRINIVIL;ZESTRIL) 10 MG tablet Take 1 tablet by mouth daily 04/10/14  Yes Harvel Quale, MD   omeprazole (PRILOSEC) 40 MG capsule Take 1 capsule by mouth daily. 07/21/13  Yes Norwood Levo, MD   cetirizine (ZYRTEC ALLERGY) 10 MG tablet Take 10 mg by mouth daily.   Yes Historical Provider, MD       Allergies as of 05/03/2015 - Review Complete 05/03/2015   Allergen Reaction Noted   ??? Morphine Itching 05/04/2014   ??? Talwin [pentazocine] Other (See Comments) 03/29/2013       Past Medical History   Diagnosis Date   ??? Anxiety    ??? Asthma    ??? Chronic back pain    ??? Deep vein thrombosis    ??? Depression    ??? GERD (gastroesophageal reflux disease)      NO LONGER SINCE LAP   ??? Pulmonary embolism (HCC)    ???  Obesity      hx of; had lap band   ??? Hyperlipidemia      hx; resolved with lap band   ??? GERD (gastroesophageal reflux disease) 01/23/2009   ??? Hypertension         Surgical History:   Past Surgical History   Procedure Laterality Date   ??? Colonoscopy     ??? Upper gastrointestinal endoscopy     ??? Cesarean section     ??? Cholecystectomy     ??? Hysterectomy     ??? Shoulder surgery     ??? Lap band  05/01/08     Dr. Nicole Cella   ??? Back surgery       neck  plates   ??? Colonoscopy  04/08/10   ??? Other surgical history       [place] Filter: curently in place   ??? Upper gastrointestinal endoscopy  05/19/13   ??? Other surgical history  07/05/13     lap band removal   ??? Upper gastrointestinal endoscopy  07/21/13     ESOPHAGEAL STENT PLACEMENT   ??? Upper gastrointestinal endoscopy  07/21/13     WITH EXCHANGE OF ESOPHAGEAL STENT   ??? Partial hysterectomy     ??? Upper gastrointestinal endoscopy N/A 07/31/2014     Esophagogastroduodenoscopy with  esophageal balloon dilation        Family History:    Family History   Problem Relation Age of Onset   ??? Arthritis Mother    ??? Cancer Mother    ??? Depression Mother    ??? High Blood Pressure Mother    ??? Heart Disease Maternal Grandmother    ??? Early Death Paternal Grandmother    ??? Heart Disease Paternal Grandfather    ??? Diabetes Other    ??? High Blood Pressure Other    ??? Obesity Other    ??? Heart Disease Mother 35     MI    ??? Ovarian Cancer Mother 73   ??? Cancer Father 45     colon   ??? Cancer Mother        History     Social History   ??? Marital Status: Married     Spouse Name: N/A     Number of Children: N/A   ??? Years of Education: N/A     Occupational History   ??? Not on file.     Social History Main Topics   ??? Smoking status: Former Smoker -- 0.00 packs/day for 35 years     Types: Cigarettes     Quit date: 08/07/2012   ??? Smokeless tobacco: Never Used   ??? Alcohol Use: No   ??? Drug Use: No   ??? Sexual Activity: Not on file     Other Topics Concern   ??? Not on file     Social History Narrative    ** Merged History Encounter **            Review of Systems   Constitutional: Negative for fever, chills, diaphoresis, activity change, appetite change, fatigue and unexpected weight change.   HENT: Negative for congestion, ear pain, mouth sores, rhinorrhea, sinus pressure, sore throat, tinnitus, trouble swallowing and voice change.    Eyes: Negative for photophobia, pain, discharge, redness and visual disturbance.   Respiratory: Positive for shortness of breath. Negative for apnea, cough, chest tightness, wheezing and stridor.    Cardiovascular: Positive for chest pain. Negative for palpitations and leg swelling.   Gastrointestinal: Negative for nausea, vomiting,  abdominal pain, diarrhea, constipation, blood in stool, abdominal distention and rectal pain.   Genitourinary: Negative for dysuria, urgency, frequency, flank pain, vaginal bleeding, vaginal discharge, difficulty urinating, genital sores, vaginal pain, menstrual problem,  pelvic pain and dyspareunia.   Musculoskeletal: Negative for back pain, joint swelling, arthralgias, neck pain and neck stiffness.   Skin: Negative for color change and rash.   Neurological: Negative for dizziness, tremors, seizures, syncope, facial asymmetry, speech difficulty, weakness, light-headedness, numbness and headaches.   Hematological: Negative for adenopathy. Does not bruise/bleed easily.   Psychiatric/Behavioral: Negative for suicidal ideas, hallucinations, confusion, sleep disturbance, self-injury, dysphoric mood and agitation.   All other systems reviewed and are negative.      Physical Exam   Constitutional: She is oriented to person, place, and time. She appears well-developed and well-nourished. No distress.   HENT:   Head: Normocephalic and atraumatic.   Left Ear: External ear normal.   Mouth/Throat: Oropharynx is clear and moist. No oropharyngeal exudate.   Eyes: Conjunctivae and EOM are normal. Pupils are equal, round, and reactive to light. Right eye exhibits no discharge. Left eye exhibits no discharge.   Neck: Normal range of motion. No JVD present. No tracheal deviation present.   Cardiovascular: Normal rate, regular rhythm, normal heart sounds and intact distal pulses.  Exam reveals no gallop and no friction rub.    No murmur heard.  Pulmonary/Chest: Effort normal and breath sounds normal. No stridor. No respiratory distress. She has no wheezes. She has no rales. She exhibits no tenderness.   Abdominal: Soft. Bowel sounds are normal. She exhibits no distension and no mass. There is no tenderness. There is no rebound and no guarding.   Musculoskeletal: Normal range of motion. She exhibits no edema or tenderness.   Lymphadenopathy:     She has no cervical adenopathy.   Neurological: She is alert and oriented to person, place, and time. She displays normal reflexes. No cranial nerve deficit. She exhibits normal muscle tone. Coordination normal.   Skin: No rash noted.   Psychiatric: She has a  normal mood and affect. Her behavior is normal. Judgment and thought content normal.       Procedures    MDM  Results for orders placed or performed during the hospital encounter of 05/03/15   Rapid influenza A/B antigens   Result Value Ref Range    Rapid Influenza A Ag Negative Negative    Rapid Influenza B Ag Negative Negative   CBC   Result Value Ref Range    WBC 13.1 (H) 4.0 - 11.0 K/uL    RBC 5.03 4.00 - 5.20 M/uL    Hemoglobin 13.9 12.0 - 16.0 g/dL    Hematocrit 43.4 36.0 - 48.0 %    MCV 86.2 80.0 - 100.0 fL    MCH 27.7 26.0 - 34.0 pg    MCHC 32.1 31.0 - 36.0 g/dL    RDW 14.7 12.4 - 15.4 %    Platelets 171 135 - 450 K/uL    MPV 8.8 5.0 - 10.5 fL   Comprehensive Metabolic Panel   Result Value Ref Range    Sodium 140 136 - 145 mmol/L    Potassium 4.2 3.5 - 5.1 mmol/L    Chloride 102 99 - 110 mmol/L    CO2 25 21 - 32 mmol/L    Anion Gap 13 3 - 16    Glucose 119 (H) 70 - 99 mg/dL    BUN 14 7 - 20 mg/dL    CREATININE  0.7 0.6 - 1.1 mg/dL    GFR Non-African American >60 >60    GFR African American >60 >60    Calcium 8.9 8.3 - 10.6 mg/dL    Total Protein 7.7 6.4 - 8.2 g/dL    Alb 3.7 3.4 - 5.0 g/dL    Albumin/Globulin Ratio 0.9 (L) 1.1 - 2.2    Total Bilirubin 0.9 0.0 - 1.0 mg/dL    Alkaline Phosphatase 90 40 - 129 U/L    ALT 74 (H) 10 - 40 U/L    AST 46 (H) 15 - 37 U/L    Globulin 4.0 g/dL   Troponin   Result Value Ref Range    Troponin <0.01 <0.01 ng/mL   Troponin   Result Value Ref Range    Troponin <0.01 <0.01 ng/mL   EKG 12 Lead   Result Value Ref Range    Ventricular Rate 91 BPM    Atrial Rate 91 BPM    P-R Interval 136 ms    QRS Duration 70 ms    Q-T Interval 360 ms    QTc Calculation (Bazett) 442 ms    P Axis 25 degrees    R Axis -2 degrees    T Axis 56 degrees    Diagnosis       Normal sinus rhythm  Cannot rule out Anterior infarct , age undetermined    Confirmed by Jacqualyn Posey MD, [place] [phone]) on 05/03/2015 3:07:52 PM         I estimate there is LOW risk for PULMONARY EMBOLISM, ACUTE CORONARY SYNDROME, OR  THORACIC AORTIC DISSECTION, thus I consider the discharge disposition reasonable. [name] and I have discussed the diagnosis and risks, and we agree with discharging home to follow-up with their primary doctor. We also discussed returning to the Emergency Department immediately if new or worsening symptoms occur. We have discussed the symptoms which are most concerning (e.g., bloody sputum, fever, worsening pain or shortness of breath, vomiting) that necessitate immediate return.     FINAL Impression    1. Chest pain, unspecified chest pain type    2. Generalized weakness    3. Fatigue, unspecified type    4. Transient hypotension    5. Dyspnea and respiratory abnormalities    6. Elevated LFTs    7. Leukocytosis, unspecified        Blood pressure 141/64, pulse 80, temperature 99.6 ??F (37.6 ??C), resp. rate 19, height 5\' 6"  (1.676 m), weight 280 lb (127.007 kg), SpO2 93 %, not currently breastfeeding.          Radiology  Xr Chest Standard Two Vw    05/03/2015   EXAMINATION: TWO VIEWS OF THE CHEST  05/03/2015 12:06 pm  COMPARISON: CT chest, 01/11/2015  HISTORY: ORDERING SYSTEM PROVIDED HISTORY: left chest sore TECHNOLOGIST PROVIDED HISTORY: Ordering Physician Provided Reason for Exam: left chest sore Acuity: Unknown Type of Exam: Unknown  FINDINGS: Anterior plates are noted over the mid and lower cervical spine.  The cardiac silhouette, mediastinal and hilar contours are normal.  No focal airspace disease or pleural effusion is seen is visualize.  The conical tip of a caval filter is present over the upper abdomen.  There is moderate spondylosis in the thoracic spine.  RECOMMENDATION: No acute cardiopulmonary disease.       EKG Interpretation.  The Ekg interpreted by me in the absence of a cardiologist shows.  normal sinus rhythm with a rate of 91  Axis is   Normal  QTc is  within an acceptable range  Intervals and Durations are unremarkable.      No specific ST-T wave changes appreciated.  No evidence of acute  ischemia.   No old EKG available for comparison.     EKG #2  The Ekg interpreted by me in the absence of a cardiologist shows.  normal sinus rhythm with a rate of 76  Axis is   Normal  QTc is  within an acceptable range  Intervals and Durations are unremarkable.      No specific ST-T wave changes appreciated.  No evidence of acute ischemia.   No significant change from prior EKG dated today as above.        Consultation 7:40 with Dr. Murrell Converse. Patient had angiogram in 2013 with normal coronary artery disease. She requests repeat EKG and troponin. Consult Dr. Susy Manor if negative.     940 pm Consultation with cardiologist, Dr. Derald Macleod. He does not feel that this patient's pain is cardiac in etiology. I then spoke with Dr. Murrell Converse. She recommends outpatient follow up and management. I discussed this with the patient and she is in agreement.       Heriberto Antigua, MD  05/03/15 2142

## 2015-05-03 NOTE — ED Notes (Signed)
Pt c/o sore throat, weakness, generalized body aches, sore all over, soreness in chest. Pt A&O. Call light within reach.  Bed in lowest position with appropriate side rails up.      Adele Barthel Tritt-Schoen, RN  05/03/15 [phone]

## 2015-05-03 NOTE — ED Notes (Signed)
Spoke with Dr Delon [place] regarding pt SBP in 70's. Orders noted for fluid bolus.     Adele Barthel Tritt-Schoen, RN  05/03/15 [phone]

## 2015-05-03 NOTE — Telephone Encounter (Signed)
For you my dear

## 2015-05-03 NOTE — ED Notes (Signed)
Bolus infusing. BP improving.     Adele Barthel Tritt-Schoen, RN  05/03/15 1451

## 2015-05-03 NOTE — Telephone Encounter (Signed)
Patient called in with Head hurts running fever 102, bodyache, weakness in the limbs, hard to swallow. Patient's husband get home around 3 pm.  Please follow up with the patient.

## 2015-05-04 LAB — EKG 12-LEAD
Atrial Rate: 76 {beats}/min
Atrial Rate: 82 {beats}/min
Atrial Rate: 91 {beats}/min
P Axis: 21 degrees
P Axis: 25 degrees
P Axis: 29 degrees
P-R Interval: 134 ms
P-R Interval: 136 ms
P-R Interval: 142 ms
Q-T Interval: 360 ms
Q-T Interval: 408 ms
Q-T Interval: 420 ms
QRS Duration: 70 ms
QRS Duration: 72 ms
QRS Duration: 74 ms
QTc Calculation (Bazett): 442 ms
QTc Calculation (Bazett): 459 ms
QTc Calculation (Bazett): 490 ms
R Axis: -2 degrees
R Axis: 3 degrees
R Axis: 6 degrees
T Axis: 48 degrees
T Axis: 56 degrees
T Axis: 59 degrees
Ventricular Rate: 76 {beats}/min
Ventricular Rate: 82 {beats}/min
Ventricular Rate: 91 {beats}/min

## 2015-05-04 LAB — TROPONIN: Troponin: <0.01 ng/mL (ref <0.01)

## 2015-05-04 MED ORDER — OXYCODONE-ACETAMINOPHEN 5-325 MG PO TABS
5-325 MG | Freq: Once | ORAL | Status: AC
Start: 2015-05-04 — End: 2015-05-03
  Administered 2015-05-04: 02:00:00 1 via ORAL

## 2015-05-04 MED ORDER — OXYCODONE-ACETAMINOPHEN 5-325 MG PO TABS
5-325 MG | ORAL_TABLET | ORAL | Status: AC | PRN
Start: 2015-05-04 — End: 2015-05-10

## 2015-05-04 MED FILL — OXYCODONE-ACETAMINOPHEN 5-325 MG PO TABS: 5-325 MG | ORAL | Qty: 1

## 2015-05-07 ENCOUNTER — Ambulatory Visit
Admit: 2015-05-07 | Discharge: 2015-05-07 | Payer: PRIVATE HEALTH INSURANCE | Attending: Physician Assistant | Primary: Student in an Organized Health Care Education/Training Program

## 2015-05-07 DIAGNOSIS — J020 Streptococcal pharyngitis: Secondary | ICD-10-CM

## 2015-05-07 LAB — POCT RAPID STREP A: Strep A Ag: POSITIVE — AB

## 2015-05-07 MED ORDER — PENICILLIN V POTASSIUM 500 MG PO TABS
500 MG | ORAL_TABLET | Freq: Four times a day (QID) | ORAL | Status: AC
Start: 2015-05-07 — End: 2015-05-17

## 2015-05-07 NOTE — Patient Instructions (Signed)
Rachael Mays was seen today for follow-up from hospital.    Diagnoses and associated orders for this visit:    Strep throat  - POCT rapid strep A  - penicillin v potassium (VEETID) 500 MG tablet; Take 1 tablet by mouth 4 times daily for 10 days         Treat pain as needed.

## 2015-05-07 NOTE — Progress Notes (Signed)
Subjective:      Patient ID: Rachael Mays is a 59 y.o. female.    HPI  Patient presents to the office today for a hospital follow up from [facility], where she presented on 5/27 with complaints of chest pain, fever, sore throat, and headache.  Was not given antibiotics, however, prescribed Percocet.  Patient states that she was checked for Influenza=Neg.  She still has a ST. She doesn't have nasal congestion or cough. No n/v/d.     Review of Systems   Constitutional: Positive for fatigue. Negative for fever, chills and appetite change.   HENT: Positive for sore throat and trouble swallowing. Negative for congestion and ear pain.    Respiratory: Negative for cough, shortness of breath and wheezing.    Gastrointestinal: Negative for nausea, vomiting and diarrhea.   Musculoskeletal: Positive for myalgias.   Skin: Negative for rash.   Neurological: Negative for dizziness and headaches.   Hematological: Positive for adenopathy.       Objective:   Physical Exam   Constitutional: She is oriented to person, place, and time. Vital signs are normal. She appears well-developed and well-nourished. She is cooperative.   HENT:   Head: Normocephalic.   Right Ear: Tympanic membrane and ear canal normal.   Left Ear: Tympanic membrane and ear canal normal.   Nose: Nose normal. Right sinus exhibits no maxillary sinus tenderness and no frontal sinus tenderness. Left sinus exhibits no maxillary sinus tenderness and no frontal sinus tenderness.   Mouth/Throat: Uvula is midline.   Pharyngeal and uvula erythemetous, edemetous   Neck: Neck supple.   Cardiovascular: Normal rate, regular rhythm and normal heart sounds.    Pulmonary/Chest: Effort normal and breath sounds normal. No respiratory distress. She has no decreased breath sounds.   Lymphadenopathy:     She has cervical adenopathy.   Neurological: She is alert and oriented to person, place, and time.       Assessment:      [name] was seen today for follow-up from hospital.    Diagnoses and  associated orders for this visit:    Strep throat  - POCT rapid strep A  - penicillin v potassium (VEETID) 500 MG tablet; Take 1 tablet by mouth 4 times daily for 10 days               Plan:      Treat pain/fever prn, call if not improving/resolving.

## 2015-05-07 NOTE — Care Coordination-Inpatient (Signed)
Ambulatory Care Coordination  ED Follow up Call    Reason for ED visit:  Chest pain SOB  Status:     not changed    Did you call your PCP prior to going to the ED?  Yes      Did you receive a discharge instructions from the Emergency Room? Yes  Review of Instructions:     Understands what to report/when to return?:  Yes   Understands discharge instructions?:  Yes   Following discharge instructions?:  Yes   If not why?     Are there any new complaints of pain? No  New Pain Meds? Yes    Constipation prophylaxis needed?  No    If you have a wound is the dressing clean, dry, and intact? N/A  Understands wound care regimen? N/A    Are there any other complaints/concerns that you wish to tell your provider?       FU appts/Provider:    Future Appointments  Date Time Provider [place]   05/14/2015 11:00 AM Ulyses Jarred, MD [facility]           New Medications?:   Yes      Medication Reconciliation by phone - Yes  Understands Medications?  Yes  Taking Medications? Yes  Can you swallow your pills?  Yes    Any further needs in the home i.e. Equipment?  No    Link to services in community?:  N/A   Which services:  Patient states she feels no better and fears she may have Strep.  Wants to see PCP today if possible.  transferred to staff to set appt.

## 2015-05-07 NOTE — Progress Notes (Signed)
I have reviewed the history and physical note and findings.

## 2015-05-14 ENCOUNTER — Ambulatory Visit
Admit: 2015-05-14 | Discharge: 2015-05-14 | Payer: PRIVATE HEALTH INSURANCE | Attending: Vascular Surgery | Primary: Student in an Organized Health Care Education/Training Program

## 2015-05-14 DIAGNOSIS — I728 Aneurysm of other specified arteries: Secondary | ICD-10-CM

## 2015-05-14 NOTE — Progress Notes (Signed)
Presented for follow-up visit from angiogram.  Angiogram revealed normal visceral vessels.  Denies any complaints.  Can follow up with me as needed.        Karlei Waldo B. Lucendia Herrlich M.D., FACS.  05/14/2015  11:29 AM

## 2015-05-15 MED ORDER — LISINOPRIL 10 MG PO TABS
10 MG | ORAL_TABLET | ORAL | Status: DC
Start: 2015-05-15 — End: 2015-06-03

## 2015-06-03 ENCOUNTER — Ambulatory Visit
Admit: 2015-06-03 | Discharge: 2015-06-03 | Payer: PRIVATE HEALTH INSURANCE | Attending: Family Medicine | Primary: Student in an Organized Health Care Education/Training Program

## 2015-06-03 DIAGNOSIS — F32A Depression, unspecified: Secondary | ICD-10-CM

## 2015-06-03 MED ORDER — ONDANSETRON HCL 4 MG PO TABS
4 MG | ORAL_TABLET | Freq: Every day | ORAL | 1 refills | Status: DC | PRN
Start: 2015-06-03 — End: 2017-01-27

## 2015-06-03 MED ORDER — ALPRAZOLAM 0.5 MG PO TABS
0.5 MG | ORAL_TABLET | Freq: Two times a day (BID) | ORAL | 0 refills | Status: DC | PRN
Start: 2015-06-03 — End: 2016-03-23

## 2015-06-03 MED ORDER — ESCITALOPRAM OXALATE 20 MG PO TABS
20 MG | ORAL_TABLET | ORAL | 0 refills | Status: DC
Start: 2015-06-03 — End: 2015-06-03

## 2015-06-03 MED ORDER — VENLAFAXINE HCL ER 150 MG PO CP24
150 MG | ORAL_CAPSULE | Freq: Every day | ORAL | 3 refills | Status: DC
Start: 2015-06-03 — End: 2016-06-10

## 2015-06-03 MED ORDER — LISINOPRIL 10 MG PO TABS
10 MG | ORAL_TABLET | Freq: Every day | ORAL | 3 refills | Status: DC
Start: 2015-06-03 — End: 2015-07-16

## 2015-06-03 MED ORDER — METOCLOPRAMIDE HCL 5 MG PO TABS
5 MG | ORAL_TABLET | ORAL | 0 refills | Status: DC
Start: 2015-06-03 — End: 2015-06-03

## 2015-06-03 NOTE — Patient Instructions (Signed)
Dermatologists:      DERMATOLOGY [phone]   Jacquelyne Balint, MD (General Dermatology)   Duffy Bruce, MD [facility])   Jackquline Denmark, [place], [place]       [facility]  [address]  [place], [place] [address]  Phone: [phone]   Billing: [phone]   Fax: [phone]       Dr. Fonnie Jarvis  [phone]  [address] [place], [place]     Betsey Holiday, MD  [phone]  [address] [place] [place] [address]    Jeanie Cooks. Chauncy Passy, [place], MD  [phone]  [place], [place] [address], [place]    Dr. Michel Santee  [address] [place]  [address]  [phone]
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
## 2015-06-03 NOTE — Progress Notes (Signed)
Pt presents with chronic fatigue, and feeling tired all the time. Also c/o "panic attacks" associated with nausea. Requesting medication refills.

## 2015-07-04 ENCOUNTER — Ambulatory Visit
Admit: 2015-07-04 | Discharge: 2015-07-04 | Payer: PRIVATE HEALTH INSURANCE | Attending: Family Medicine | Primary: Student in an Organized Health Care Education/Training Program

## 2015-07-04 DIAGNOSIS — F32A Depression, unspecified: Secondary | ICD-10-CM

## 2015-07-04 MED ORDER — TRAZODONE HCL 100 MG PO TABS
100 MG | ORAL_TABLET | Freq: Every evening | ORAL | 3 refills | Status: DC
Start: 2015-07-04 — End: 2015-07-04

## 2015-07-04 MED ORDER — TRAZODONE HCL 100 MG PO TABS
100 MG | ORAL_TABLET | Freq: Every evening | ORAL | 1 refills | Status: DC
Start: 2015-07-04 — End: 2016-01-02

## 2015-07-04 NOTE — Progress Notes (Signed)
Subjective:      Patient ID: Rachael Mays is a 59 y.o. female.    HPI    Feeling better on the Effexor than the Lexapro.  Energy better.  Concentrating better.  Crying some but infrequently.  Still having trouble falling asleep at night and getting up the morning.  Appetite good.  No SI or HI recently.  Had SI once a few weeks ago.  No plan.      Review of Systems    Patient Active Problem List   Diagnosis   ??? Back pain   ??? Deep vein thrombosis ([place])   ??? Depression   ??? Hyperlipidemia   ??? Pulmonary embolism ([place])   ??? Nausea & vomiting   ??? Abdominal  pain, other specified site   ??? Morbid obesity ([place])   ??? Status post gastric banding   ??? Vertigo   ??? Dizziness   ??? Tinnitus, subjective   ??? Complication of gastric banding   ??? HTN (hypertension)   ??? Hyperglycemia   ??? Chest pain   ??? 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 Adrenal mass, left ([place])   ??? Adrenal nodule [facility])       Outpatient Prescriptions Marked as Taking for the 07/04/15 encounter (Office Visit) with Harvel Quale, MD   Medication Sig Dispense Refill   ??? ALPRAZolam (XANAX) 0.5 MG tablet Take 1 tablet by mouth 2 times daily as needed for Anxiety 60 tablet 0   ??? ondansetron (ZOFRAN) 4 MG tablet Take 1 tablet by mouth daily as needed for Nausea or Vomiting 30 tablet 1   ??? lisinopril (PRINIVIL;ZESTRIL) 10 MG tablet Take 2 tablets by mouth daily 90 tablet 3   ??? venlafaxine (EFFEXOR XR) 150 MG XR capsule Take 1 capsule by mouth daily 90 capsule 3   ??? montelukast (SINGULAIR) 10 MG tablet TAKE ONE TABLET BY MOUTH DAILY 90 tablet 0   ??? nystatin 100000 UNIT/GM POWD APPLY 3 TIMES DAILY 30 g 4   ??? fluocinonide (LIDEX) 0.05 % cream Apply topically 2 times daily. 1 Tube 1   ??? metaxalone (SKELAXIN) 800 MG tablet TAKE ONE TABLET BY MOUTH THREE TIMES A DAY 30 tablet 1   ??? Multiple Vitamins-Minerals (MULTIVITAMIN PO) Take by mouth     ??? CALCIUM-VITAMIN D PO Take by mouth     ??? albuterol (PROVENTIL HFA) 108 (90 BASE) MCG/ACT inhaler Inhale 2 puffs into the lungs every 6  hours as needed for Wheezing 3 Inhaler 3   ??? omeprazole (PRILOSEC) 40 MG capsule Take 1 capsule by mouth daily. 30 capsule 3   ??? cetirizine (ZYRTEC ALLERGY) 10 MG tablet Take 10 mg by mouth daily.         Allergies   Allergen Reactions   ??? Morphine Itching   ??? Talwin [Pentazocine] Other (See Comments)     dizzy       Social History   Substance Use Topics   ??? Smoking status: Former Smoker     Packs/day: 0.00     Years: 35.00     Types: Cigarettes     Quit date: 08/07/2012   ??? Smokeless tobacco: Never Used   ??? Alcohol use No       Objective:     Visit Vitals   ??? BP 140/90   ??? Pulse 76   ??? Wt 295 lb (133.8 kg)   ??? SpO2 98%   ??? BMI 47.61 kg/m2       Physical  Exam   Constitutional: She is oriented to person, place, and time. She appears well-developed and well-nourished.   Neurological: She is alert and oriented to person, place, and time.   Psychiatric: She has a normal mood and affect. Her behavior is normal.       Assessment:     1. Depression, unspecified depression type     2. Insomnia, unspecified type  traZODone (DESYREL) 100 MG tablet         Plan:     Continue Effexor.  Try trazodone for insomnia.  Discussed sleep hygiene.

## 2015-07-04 NOTE — Progress Notes (Signed)
Pt presents for a follow up, started a new medication Effexor 06-03-15.

## 2015-07-15 ENCOUNTER — Telehealth

## 2015-07-15 NOTE — Telephone Encounter (Signed)
Increase lisinopril to 40mg  daily. Add HCTZ 25mg  daily. Continue log and report to office 2 weeks. Check bmp 2 weeks.         Me   ?? 07/15/15 5:05 PM   Note      lft vm message.

## 2015-07-15 NOTE — Telephone Encounter (Signed)
lft vm message.

## 2015-07-15 NOTE — Telephone Encounter (Signed)
Pt was seen 7/28 by TS  She was told to call back with her bp readings    Pt readings were:  7/31= 64/82  8/1= 153/91  8/2= 173/93  8/3= 179/98  8/4= 124/74  8/5= 175/98  8/6= 174/96  8/7= 175/98    Today her reading was 170/99  Pt is feeling very anxious and jittery  Says she has been having headaches  She is concerned because she never has headaches    What should she do?  Pt uses [facility] on [address] if a med is given for more than 46mos she needs to use [facility] order)    Please advise

## 2015-07-15 NOTE — Telephone Encounter (Signed)
Increase lisinopril to 40mg  daily.  Add HCTZ 25mg  daily.  Continue log and report to office 2 weeks.  Check bmp 2 weeks.

## 2015-07-15 NOTE — Telephone Encounter (Signed)
Provider please advise.

## 2015-07-16 ENCOUNTER — Encounter

## 2015-07-16 MED ORDER — HYDROCHLOROTHIAZIDE 25 MG PO TABS
25 MG | ORAL_TABLET | Freq: Every day | ORAL | 0 refills | Status: DC
Start: 2015-07-16 — End: 2015-10-25

## 2015-07-16 NOTE — Telephone Encounter (Signed)
lft vm message.

## 2015-07-16 NOTE — Telephone Encounter (Signed)
Pt notified and she states she has been taking  40 mg since her July appt, pt also states she will restart HCTZ 25 mg as she still have some left form previous Rx. Pt will start recording new BP's tomorrow.

## 2015-07-16 NOTE — Telephone Encounter (Signed)
Okay.  Thanks.  Please make sure meds are updated in chart.

## 2015-07-16 NOTE — Telephone Encounter (Signed)
Done.

## 2015-07-31 MED ORDER — MONTELUKAST SODIUM 10 MG PO TABS
10 MG | ORAL_TABLET | ORAL | 1 refills | Status: DC
Start: 2015-07-31 — End: 2016-03-18

## 2015-07-31 NOTE — Telephone Encounter (Signed)
Patient called in with a 2 week Blood pressure reading    07/16/15 175/99  07/17/15  173/96  07/18/15  155/99  07/19/15  175/78  07/20/15  156/80  07/21/15  149/77  07/22/15  156/98  07/23/15  155/97  07/24/15  166/97  07/25/15  147/90  07/26/15  143/73  07/27/15  158/93  07/28/15  129/61  07/29/15  158/98  07/30/15  157/82

## 2015-07-31 NOTE — Telephone Encounter (Signed)
Please advise.

## 2015-08-04 NOTE — Telephone Encounter (Signed)
Begin metoprolol 25mg  bid.  Follow bp and pulse and reprot log in 2 weeks.

## 2015-08-05 MED ORDER — METOPROLOL TARTRATE 25 MG PO TABS
25 MG | ORAL_TABLET | Freq: Two times a day (BID) | ORAL | 3 refills | Status: DC
Start: 2015-08-05 — End: 2015-08-22

## 2015-08-05 NOTE — Telephone Encounter (Signed)
Patient notified and rx has been sent to the pharmacy.

## 2015-08-06 MED ORDER — METOCLOPRAMIDE HCL 5 MG PO TABS
5 MG | ORAL_TABLET | ORAL | 0 refills | Status: DC
Start: 2015-08-06 — End: 2015-10-10

## 2015-08-22 ENCOUNTER — Inpatient Hospital Stay: Attending: Family Medicine | Primary: Student in an Organized Health Care Education/Training Program

## 2015-08-22 ENCOUNTER — Ambulatory Visit
Admit: 2015-08-22 | Discharge: 2015-08-22 | Payer: PRIVATE HEALTH INSURANCE | Attending: Family Medicine | Primary: Student in an Organized Health Care Education/Training Program

## 2015-08-22 ENCOUNTER — Encounter: Admit: 2015-08-22 | Primary: Student in an Organized Health Care Education/Training Program

## 2015-08-22 DIAGNOSIS — I10 Essential (primary) hypertension: Secondary | ICD-10-CM

## 2015-08-22 DIAGNOSIS — S20419A Abrasion of unspecified back wall of thorax, initial encounter: Secondary | ICD-10-CM

## 2015-08-22 MED ORDER — METOPROLOL TARTRATE 50 MG PO TABS
50 MG | ORAL_TABLET | Freq: Two times a day (BID) | ORAL | 3 refills | Status: DC
Start: 2015-08-22 — End: 2015-08-30

## 2015-08-22 NOTE — Progress Notes (Signed)
Subjective:      Patient ID: Rachael Mays is a 59 y.o. female.    HPI  Feeling bad x 2 months.  Feeling fatigued.  Vision has been blurry x 1 month.  Sleeping until 1pm.  Sleeping for over 11 hours at night.  Compliant with Effexor but mood is depressed.  Daughter will not allow her to see her step grandchild which is distressing.  Stems from daughter thinking that [name] does not like her partner.  No HI but has thought "might be better" if she was not here.  No plan.      Review of Systems    Patient Active Problem List   Diagnosis   ??? Back pain   ??? Deep vein thrombosis ([place])   ??? Depression   ??? Hyperlipidemia   ??? Pulmonary embolism ([place])   ??? Nausea & vomiting   ??? Abdominal  pain, other specified site   ??? Morbid obesity ([place])   ??? Status post gastric banding   ??? Vertigo   ??? Dizziness   ??? Tinnitus, subjective   ??? Complication of gastric banding   ??? HTN (hypertension)   ??? Hyperglycemia   ??? Chest pain   ??? 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 Adrenal mass, left ([place])   ??? Adrenal nodule [facility])       Outpatient Prescriptions Marked as Taking for the 08/22/15 encounter (Office Visit) with Harvel Quale, MD   Medication Sig Dispense Refill   ??? metoclopramide (REGLAN) 5 MG tablet TAKE ONE TABLET BY MOUTH FOUR TIMES A DAY BEFORE MEALS AND AT BEDTIME 120 tablet 0   ??? metoprolol tartrate (LOPRESSOR) 25 MG tablet Take 1 tablet by mouth 2 times daily 180 tablet 3   ??? montelukast (SINGULAIR) 10 MG tablet TAKE 1 TABLET BY MOUTH DAILY 90 tablet 1   ??? hydrochlorothiazide (HYDRODIURIL) 25 MG tablet Take 1 tablet by mouth daily 90 tablet 0   ??? lisinopril (PRINIVIL;ZESTRIL) 40 MG tablet Take 40 mg by mouth daily     ??? traZODone (DESYREL) 100 MG tablet Take 1 tablet by mouth nightly 90 tablet 1   ??? ALPRAZolam (XANAX) 0.5 MG tablet Take 1 tablet by mouth 2 times daily as needed for Anxiety 60 tablet 0   ??? ondansetron (ZOFRAN) 4 MG tablet Take 1 tablet by mouth daily as needed for Nausea or Vomiting 30 tablet 1   ??? venlafaxine (EFFEXOR  XR) 150 MG XR capsule Take 1 capsule by mouth daily 90 capsule 3   ??? nystatin 100000 UNIT/GM POWD APPLY 3 TIMES DAILY 30 g 4   ??? fluocinonide (LIDEX) 0.05 % cream Apply topically 2 times daily. 1 Tube 1   ??? metaxalone (SKELAXIN) 800 MG tablet TAKE ONE TABLET BY MOUTH THREE TIMES A DAY 30 tablet 1   ??? Multiple Vitamins-Minerals (MULTIVITAMIN PO) Take by mouth     ??? CALCIUM-VITAMIN D PO Take by mouth     ??? albuterol (PROVENTIL HFA) 108 (90 BASE) MCG/ACT inhaler Inhale 2 puffs into the lungs every 6 hours as needed for Wheezing 3 Inhaler 3   ??? omeprazole (PRILOSEC) 40 MG capsule Take 1 capsule by mouth daily. 30 capsule 3   ??? cetirizine (ZYRTEC ALLERGY) 10 MG tablet Take 10 mg by mouth daily.         Allergies   Allergen Reactions   ??? Morphine Itching   ??? Talwin [Pentazocine] Other (See Comments)     dizzy  Social History   Substance Use Topics   ??? Smoking status: Former Smoker     Packs/day: 0.00     Years: 35.00     Types: Cigarettes     Quit date: 08/07/2012   ??? Smokeless tobacco: Never Used   ??? Alcohol use No       Objective:     Visit Vitals   ??? BP (!) 168/92 (Site: Right Arm, Position: Sitting, Cuff Size: Large Adult)   ??? Pulse 94   ??? Wt 297 lb (134.7 kg)   ??? SpO2 97%   ??? BMI 47.94 kg/m2       Physical Exam   Constitutional: She is oriented to person, place, and time. She appears well-developed and well-nourished. No distress.   Cardiovascular: Normal rate, regular rhythm and normal heart sounds.    No murmur heard.  Pulmonary/Chest: Effort normal and breath sounds normal. She has no wheezes. She has no rales.   Neurological: She is alert and oriented to person, place, and time.   Psychiatric: She has a normal mood and affect. Her behavior is normal.       Assessment:     1. Essential hypertension  metoprolol tartrate (LOPRESSOR) 50 MG tablet   2. Other fatigue  XR Chest PA and Lateral   3. Back abrasion, unspecified laterality, initial encounter  XR Thoracic Spine Standard     Plan:     Increase metoprolol.   Follow home bp and pulse.  Check cmp, cbc, HgA1c, flp, tsh.    Check chest xray and thoracic spine xray.

## 2015-08-22 NOTE — Progress Notes (Signed)
Patient presents today for fatigue, feeling jittery, blurred vision, rib pain on and off for 2 months on the right and radiates to the back.

## 2015-08-23 ENCOUNTER — Telehealth

## 2015-08-23 LAB — COMPREHENSIVE METABOLIC PANEL
ALT: 99 U/L — ABNORMAL HIGH (ref 10–40)
AST: 78 U/L — ABNORMAL HIGH (ref 15–37)
Albumin/Globulin Ratio: 1.2 (ref 1.1–2.2)
Albumin: 3.9 g/dL (ref 3.4–5.0)
Alkaline Phosphatase: 96 U/L (ref 40–129)
Anion Gap: 12 (ref 3–16)
BUN: 14 mg/dL (ref 7–20)
CO2: 28 mmol/L (ref 21–32)
Calcium: 9.5 mg/dL (ref 8.3–10.6)
Chloride: 100 mmol/L (ref 99–110)
Creatinine: 0.8 mg/dL (ref 0.6–1.1)
GFR African American: >60 (ref >60)
GFR Non-African American: >60 (ref >60)
Globulin: 3.2 g/dL
Glucose: 131 mg/dL — ABNORMAL HIGH (ref 70–99)
Potassium: 4 mmol/L (ref 3.5–5.1)
Sodium: 140 mmol/L (ref 136–145)
Total Bilirubin: 0.4 mg/dL (ref 0.0–1.0)
Total Protein: 7.1 g/dL (ref 6.4–8.2)

## 2015-08-23 LAB — CBC WITH AUTO DIFFERENTIAL
Basophils %: 0.9 %
Basophils Absolute: 0.1 10*3/uL (ref 0.0–0.2)
Eosinophils %: 4.8 %
Eosinophils Absolute: 0.4 10*3/uL (ref 0.0–0.6)
Hematocrit: 42.6 % (ref 36.0–48.0)
Hemoglobin: 14.1 g/dL (ref 12.0–16.0)
Lymphocytes %: 36.2 %
Lymphocytes Absolute: 3 10*3/uL (ref 1.0–5.1)
MCH: 28.7 pg (ref 26.0–34.0)
MCHC: 33.1 g/dL (ref 31.0–36.0)
MCV: 86.8 fL (ref 80.0–100.0)
MPV: 10.1 fL (ref 5.0–10.5)
Monocytes %: 9.6 %
Monocytes Absolute: 0.8 10*3/uL (ref 0.0–1.3)
Neutrophils %: 48.5 %
Neutrophils Absolute: 3.9 10*3/uL (ref 1.7–7.7)
Platelets: 166 10*3/uL (ref 135–450)
RBC: 4.9 M/uL (ref 4.00–5.20)
RDW: 14.3 % (ref 12.4–15.4)
WBC: 8.1 10*3/uL (ref 4.0–11.0)

## 2015-08-23 LAB — HEMOGLOBIN A1C
Hemoglobin A1C: 5.6 %
eAG: 114 mg/dL

## 2015-08-23 LAB — VITAMIN B12 & FOLATE
Folate: >20 ng/mL — ABNORMAL HIGH (ref 3.10–17.50)
Vitamin B-12: 958 pg/mL — ABNORMAL HIGH (ref 211–911)

## 2015-08-23 LAB — TSH: TSH: 1.37 u[IU]/mL (ref 0.27–4.20)

## 2015-08-23 NOTE — Telephone Encounter (Signed)
Patient notified of test results and has an appointment to see GI next week.  Patient would like to know what to do about the rib pain.  Sleep study has been faxed to [facility] and they will call to schedule her.

## 2015-08-23 NOTE — Progress Notes (Signed)
Patient advised of results.

## 2015-08-23 NOTE — Telephone Encounter (Signed)
Patient called and would like the results of her lab tests and  X-ray's. She was informed that her labs were not read yet,  But there are notations on her X-ray results.

## 2015-08-25 NOTE — Telephone Encounter (Signed)
Rib pain?  The pain that she described to me was mid/upper back with some radiation to both sides.  Is that what she means?  Something else?

## 2015-08-26 NOTE — Telephone Encounter (Signed)
According to patient this is the same pain as described at the visit.

## 2015-08-26 NOTE — Telephone Encounter (Signed)
PT

## 2015-08-27 NOTE — Telephone Encounter (Signed)
Left vm message.  Placed PT papers at front desk for pt to pick up.

## 2015-08-27 NOTE — Telephone Encounter (Signed)
Patient notified.

## 2015-08-29 NOTE — Telephone Encounter (Signed)
lft vm message.

## 2015-08-29 NOTE — Telephone Encounter (Signed)
Patient called in Blood Pressure readings for the week    09/15  AM 148/81  HR 65  PM  138/72  HR 60  09/16 AM  163/82  HR 70  PM forgot to do  09/17  AM  156/77  HR 77  PM forgot to do  09/18  AM  154/92  HR  69  PM 153/82  HR 73  09/19  AM  154/84  HR  70  PM  172/90  HR 73  09/20  AM   157/98  HR 69  PM  163/78  HR  71  09/21 AM   157/87  HR  75   PM 154/79  HR 65  09/22  Am  146/94  HR  66

## 2015-08-29 NOTE — Telephone Encounter (Signed)
Increase metoprolol to 100mg  bid.

## 2015-08-29 NOTE — Telephone Encounter (Signed)
Provider please advise.

## 2015-08-30 MED ORDER — METOPROLOL TARTRATE 100 MG PO TABS
100 MG | ORAL_TABLET | Freq: Two times a day (BID) | ORAL | 3 refills | Status: DC
Start: 2015-08-30 — End: 2015-09-10

## 2015-08-30 NOTE — Telephone Encounter (Signed)
Pt informed.  Requested new script be sent to [facility] mail order.

## 2015-09-03 NOTE — Patient Instructions (Signed)
Name_______________________________________Printed:____________________  Date and time of surgery_____9/28/16  0830___________________Arrival Time:____0700  [facility]EC___________   1. Do not eat or drink anything after 12 midnight (or____hours) prior to surgery. This includes no water, chewing gum or mints.   2. Take the following pills with a small sip of water on the morning of surgery_xanax prn, inhaler prn, singulair, omeprazole, reglan____________________________________________________   3. Aspirin, Ibuprofen, Advil, Naproxen, Vitamin E and other Anti-inflammatory products should be stopped for 5 days before surgery or as directed by your physician.   4. Check with your Doctor regarding stopping Plavix, Coumadin,Eliquis, Lovenox,Effient,Pradaxa,Xarelto, Fragmin or other blood thinners and follow their instructions.   5. Do not smoke, and do not drink any alcoholic beverages 24 hours prior to surgery.  This includes NA Beer.   6. You may brush your teeth and gargle the morning of surgery.  DO NOT SWALLOW WATER   7. You MUST make arrangements for a responsible adult to take you home after your surgery. You will not be allowed to leave alone or drive yourself home.  It is strongly suggested someone stay with you the first 24 hrs. Your surgery will be cancelled if you do not have a ride home.   8. A parent/legal guardian must accompany a child scheduled for surgery and plan to stay at the hospital until the child is discharged.  Please do not bring other children with you.   9. Please wear simple, loose fitting clothing to the hospital.  Do not bring valuables (money, credit cards, checkbooks, etc.) Do not wear any makeup (including no eye makeup) or nail polish on your fingers or toes.             10. DO NOT wear any jewelry or piercings on day of surgery.  All body piercing jewelry must be removed.             11. If you have ___dentures, they will be removed before going to the OR; we will provide you a container.  If  you wear ___contact lenses or ___glasses, they will be removed; please bring a case for them.             12. Please see your family doctor/pediatrician for a history & physical and/or concerning medications.  Bring any test results/reports from your physician's office.   PCP__________________Phone___________H&P Appt. Date________             13 If you  have a Living Will and Durable Power of Attorney for Healthcare, please bring in a copy.             44. Notify your Surgeon if you develop any illness between now and surgery  time, cough, cold, fever, sore throat, nausea, vomiting, etc.  Please notify your surgeon if you experience dizziness, shortness of breath or blurred vision between now & the time of your surgery             15. DO NOT shave your operative site 96 hours prior to surgery. For face & neck surgery, men may use an electric razor 48 hours prior to surgery.             16. Shower the night before surgery with ___Antibacterial soap ___Hibiclens             17. To provide excellent care visitors will be limited to one in the room at any given time.             18.  Please bring  picture ID and insurance card.             19.  Visit our web site for additional information:  [email or web address]              20.During flu season no children under the age of 22 are permitted in the hospital for the safety of all patients.                              21. If you take a long acting insulin in the evening only  take half of your usual  dose the night  before your procedure              22. If you use a c-pap please bring DOS if staying overnight,             23.For your convenience [facility] has a pharmacy on site to fill your prescriptions.             24. If you use oxygen and have a portable tank please bring it  with you the DOS             25. Bring a complete list of all your medications with name and dose include any supplements.             26.  Other__________________________________________   *Please call pre admission testing if you any further questions   Rachael Mays         [place]   [place]    [address] [place]  [phone]   [place]       All above information reviewed with patient in person or by phone.Patient verbalizes understanding.All questions and concerns addressed.                                                                                                 Patient/Rep___pt________________                                                                                                                                    PRE OP INSTRUCTIONS

## 2015-09-04 ENCOUNTER — Inpatient Hospital Stay: Attending: Gastroenterology | Primary: Student in an Organized Health Care Education/Training Program

## 2015-09-04 MED ORDER — SODIUM CHLORIDE 0.45 % IV SOLN
0.45 % | INTRAVENOUS | Status: AC
Start: 2015-09-04 — End: ?

## 2015-09-04 MED FILL — SODIUM CHLORIDE 0.45 % IV SOLN: 0.45 % | INTRAVENOUS | Qty: 1000

## 2015-09-04 MED FILL — MIDAZOLAM HCL 2 MG/2ML IJ SOLN: 2 MG/ML | INTRAMUSCULAR | Qty: 2

## 2015-09-04 MED FILL — PROPOFOL 200 MG/20ML IV EMUL: 200 MG/20ML | INTRAVENOUS | Qty: 40

## 2015-09-04 MED FILL — LIDOCAINE HCL (CARDIAC) 20 MG/ML IV SOLN: 20 MG/ML | INTRAVENOUS | Qty: 5

## 2015-09-04 NOTE — Anesthesia Pre-Procedure Evaluation (Signed)
Department of Anesthesiology  Preprocedure Note       Name:  Rachael Mays   Age:  59 y.o.  DOB:  1955/12/21                                          MRN:  [id number]         Date:  09/04/2015      Surgeon:    Procedure:    Medications prior to admission:   Prior to Admission medications    Medication Sig Start Date End Date Taking? Authorizing Provider   metoprolol (LOPRESSOR) 100 MG tablet Take 1 tablet by mouth 2 times daily 08/30/15   Harvel Quale, MD   metoclopramide (REGLAN) 5 MG tablet TAKE ONE TABLET BY MOUTH FOUR TIMES A DAY BEFORE MEALS AND AT BEDTIME 08/06/15   Harvel Quale, MD   montelukast (SINGULAIR) 10 MG tablet TAKE 1 TABLET BY MOUTH DAILY 07/31/15   Jamal Collin, MD   hydrochlorothiazide (HYDRODIURIL) 25 MG tablet Take 1 tablet by mouth daily 07/16/15   Harvel Quale, MD   lisinopril (PRINIVIL;ZESTRIL) 40 MG tablet Take 40 mg by mouth daily    Historical Provider, MD   traZODone (DESYREL) 100 MG tablet Take 1 tablet by mouth nightly 07/04/15   Harvel Quale, MD   ALPRAZolam Duanne Moron) 0.5 MG tablet Take 1 tablet by mouth 2 times daily as needed for Anxiety 06/03/15   Harvel Quale, MD   ondansetron [facility]) 4 MG tablet Take 1 tablet by mouth daily as needed for Nausea or Vomiting 06/03/15   Harvel Quale, MD   venlafaxine (EFFEXOR XR) 150 MG XR capsule Take 1 capsule by mouth daily 06/03/15   Harvel Quale, MD   nystatin 100000 UNIT/GM POWD APPLY 3 TIMES DAILY 04/08/15   Harvel Quale, MD   fluocinonide (LIDEX) 0.05 % cream Apply topically 2 times daily. 02/11/15   Harvel Quale, MD   metaxalone [facility]) 800 MG tablet TAKE ONE TABLET BY MOUTH THREE TIMES A DAY 01/02/15   Jamal Collin, MD   Multiple Vitamins-Minerals (MULTIVITAMIN PO) Take by mouth    Historical Provider, MD   CALCIUM-VITAMIN D PO Take by mouth    Historical Provider, MD   albuterol (PROVENTIL HFA) 108 (90 BASE) MCG/ACT inhaler Inhale 2 puffs into  the lungs every 6 hours as needed for Wheezing 05/17/14   Harvel Quale, MD   omeprazole (PRILOSEC) 40 MG capsule Take 1 capsule by mouth daily. 07/21/13   Norwood Levo, MD   cetirizine (ZYRTEC ALLERGY) 10 MG tablet Take 10 mg by mouth daily.    Historical Provider, MD       Current medications:    Current Outpatient Prescriptions   Medication Sig Dispense Refill   ??? metoprolol (LOPRESSOR) 100 MG tablet Take 1 tablet by mouth 2 times daily 60 tablet 3   ??? metoclopramide (REGLAN) 5 MG tablet TAKE ONE TABLET BY MOUTH FOUR TIMES A DAY BEFORE MEALS AND AT BEDTIME 120 tablet 0   ??? montelukast (SINGULAIR) 10 MG tablet TAKE 1 TABLET BY MOUTH DAILY 90 tablet 1   ??? hydrochlorothiazide (HYDRODIURIL) 25 MG tablet Take 1 tablet by mouth daily 90 tablet 0   ??? lisinopril (PRINIVIL;ZESTRIL) 40 MG tablet Take 40 mg by mouth  daily     ??? traZODone (DESYREL) 100 MG tablet Take 1 tablet by mouth nightly 90 tablet 1   ??? ALPRAZolam (XANAX) 0.5 MG tablet Take 1 tablet by mouth 2 times daily as needed for Anxiety 60 tablet 0   ??? ondansetron (ZOFRAN) 4 MG tablet Take 1 tablet by mouth daily as needed for Nausea or Vomiting 30 tablet 1   ??? venlafaxine (EFFEXOR XR) 150 MG XR capsule Take 1 capsule by mouth daily 90 capsule 3   ??? nystatin 100000 UNIT/GM POWD APPLY 3 TIMES DAILY 30 g 4   ??? fluocinonide (LIDEX) 0.05 % cream Apply topically 2 times daily. 1 Tube 1   ??? metaxalone (SKELAXIN) 800 MG tablet TAKE ONE TABLET BY MOUTH THREE TIMES A DAY 30 tablet 1   ??? Multiple Vitamins-Minerals (MULTIVITAMIN PO) Take by mouth     ??? CALCIUM-VITAMIN D PO Take by mouth     ??? albuterol (PROVENTIL HFA) 108 (90 BASE) MCG/ACT inhaler Inhale 2 puffs into the lungs every 6 hours as needed for Wheezing 3 Inhaler 3   ??? omeprazole (PRILOSEC) 40 MG capsule Take 1 capsule by mouth daily. 30 capsule 3   ??? cetirizine (ZYRTEC ALLERGY) 10 MG tablet Take 10 mg by mouth daily.       No current facility-administered medications for this encounter.         Allergies:    Allergies   Allergen Reactions   ??? Morphine Itching   ??? Talwin [Pentazocine] Other (See Comments)     dizzy       Problem List:    Patient Active Problem List   Diagnosis Code   ??? Back pain M54.9   ??? Deep vein thrombosis (HCC) I82.409   ??? Depression F32.9   ??? Hyperlipidemia E78.5   ??? Pulmonary embolism (HCC) I26.99   ??? Nausea & vomiting R11.2   ??? Abdominal  pain, other specified site R10.9   ??? Morbid obesity ([place]) E66.01   ??? 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 Vertigo R42   ??? Dizziness R42   ??? Tinnitus, subjective X10.62   ??? Complication of gastric banding K95.09   ??? HTN (hypertension) I10   ??? Hyperglycemia R73.9   ??? Chest pain R07.9   ??? 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 Adrenal mass, left (HCC) E27.9   ??? Adrenal nodule (HCC) E27.9       Past Medical History:        Diagnosis Date   ??? Anxiety    ??? Asthma    ??? Chronic back pain    ??? Deep vein thrombosis    ??? Depression    ??? GERD (gastroesophageal reflux disease)      NO LONGER SINCE LAP   ??? GERD (gastroesophageal reflux disease) 01/23/2009   ??? Hyperlipidemia      hx; resolved with lap band   ??? Hypertension    ??? Obesity      hx of; had lap band   ??? Pulmonary embolism [facility])        Past Surgical History:        Procedure Laterality Date   ??? Colonoscopy     ??? Upper gastrointestinal endoscopy     ??? Cesarean section     ??? Cholecystectomy     ??? Hysterectomy     ??? Shoulder surgery     ??? Lap band  05/01/08     Dr. Nicole Cella   ??? Back surgery  neck  plates   ??? Colonoscopy  04/08/10   ??? Other surgical history       [place] Filter: curently in place   ??? Upper gastrointestinal endoscopy  05/19/13   ??? Other surgical history  07/05/13     lap band removal   ??? Upper gastrointestinal endoscopy  07/21/13     ESOPHAGEAL STENT PLACEMENT   ??? Upper gastrointestinal endoscopy  07/21/13     WITH EXCHANGE OF ESOPHAGEAL STENT   ??? Partial hysterectomy     ??? Upper gastrointestinal endoscopy N/A 07/31/2014     Esophagogastroduodenoscopy with esophageal balloon dilation        Social History:    Social History   Substance Use Topics   ??? Smoking status: Former Smoker     Packs/day: 0.00     Years: 35.00     Types: Cigarettes     Quit date: 08/07/2012   ??? Smokeless tobacco: Never Used   ??? Alcohol use No                                Counseling given: Not Answered      Vital Signs (Current): There were no vitals filed for this visit.                                           BP Readings from Last 3 Encounters:   08/22/15 (!) 168/92   07/04/15 152/88   06/03/15 (!) 140/98       NPO Status:  finished prep 330 am, npo all else more than 8 hrs                                                                               BMI:   Wt Readings from Last 3 Encounters:   09/03/15 290 lb (131.5 kg)   08/22/15 297 lb (134.7 kg)   07/04/15 295 lb (133.8 kg)     There is no height or weight on file to calculate BMI.    Anesthesia Evaluation  Patient summary reviewed no history of anesthetic complications:   Airway: Mallampati: III  TM distance: >3 FB   Neck ROM: full  Mouth opening: > = 3 FB Dental: normal exam     Comment: Missing one lower molar    Pulmonary:normal exam  breath sounds clear to auscultation  (+) asthma (last albuterol use 2 mo ago):     ROS comment: Symptoms of OSA  H/o PE   Cardiovascular:    (+) hypertension:,     (-) CABG/stent, dysrhythmias and  angina      Rhythm: regular  Rate: normal           ROS comment: Can climb one flight of stairs before DOE  05/04/14 stress echo -no ischemia     Beta Blocker:  Dose within 24 Hrs   Neuro/Psych:   (+) psychiatric history (anxiety, tearful about procedure):   (-) seizures, TIA and CVA  GI/Hepatic/Renal:   (+) GERD: well controlled,  Comments: Lap band removal    Endo/Other:          Abdominal:   (+) obese,                  Anesthesia Plan    ASA 3     TIVA   (Patient verbalizes understanding that there is the possibility of recall with TIVA.  Patient verbalizes her wishes to proceed with planned anesthetic.)  intravenous induction    Anesthetic plan and risks discussed with patient.    Plan discussed with CRNA.            Everlene Farrier, MD   09/04/2015

## 2015-09-04 NOTE — Anesthesia Post-Procedure Evaluation (Signed)
Anesthesia Post-op Note    Patient: Rachael Mays  MRN: [id number]  Birthdate: 1956/01/29  Date of evaluation: 09/04/2015  Time:  12:54 PM     Procedure(s) Performed:     Last Vitals: There were no vitals taken for this visit.    Aldrete Phase I:      Aldrete Phase II:      Anesthesia Post Evaluation    Final anesthesia type: TIVA  Patient location during evaluation: procedure area  Patient participation: complete - patient participated  Level of consciousness: awake and alert  Airway patency: patent  Nausea & Vomiting: no vomiting  Complications: no  Cardiovascular status: hemodynamically stable  Respiratory status: acceptable  Hydration status: euvolemic        Everlene Farrier, MD  12:54 PM

## 2015-09-06 ENCOUNTER — Inpatient Hospital Stay: Attending: Gastroenterology | Primary: Student in an Organized Health Care Education/Training Program

## 2015-09-06 ENCOUNTER — Encounter

## 2015-09-06 ENCOUNTER — Encounter: Admit: 2015-09-06 | Primary: Student in an Organized Health Care Education/Training Program

## 2015-09-06 DIAGNOSIS — R109 Unspecified abdominal pain: Secondary | ICD-10-CM

## 2015-09-06 LAB — CBC WITH AUTO DIFFERENTIAL
Basophils %: 0.8 %
Basophils Absolute: 0.1 10*3/uL (ref 0.0–0.2)
Eosinophils %: 5.2 %
Eosinophils Absolute: 0.4 10*3/uL (ref 0.0–0.6)
Hematocrit: 41.5 % (ref 36.0–48.0)
Hemoglobin: 14 g/dL (ref 12.0–16.0)
Lymphocytes %: 33.5 %
Lymphocytes Absolute: 2.7 10*3/uL (ref 1.0–5.1)
MCH: 29.4 pg (ref 26.0–34.0)
MCHC: 33.8 g/dL (ref 31.0–36.0)
MCV: 86.7 fL (ref 80.0–100.0)
MPV: 9.6 fL (ref 5.0–10.5)
Monocytes %: 9.1 %
Monocytes Absolute: 0.7 10*3/uL (ref 0.0–1.3)
Neutrophils %: 51.4 %
Neutrophils Absolute: 4.1 10*3/uL (ref 1.7–7.7)
Platelets: 165 10*3/uL (ref 135–450)
RBC: 4.79 M/uL (ref 4.00–5.20)
RDW: 14.5 % (ref 12.4–15.4)
WBC: 8 10*3/uL (ref 4.0–11.0)

## 2015-09-06 LAB — BASIC METABOLIC PANEL
Anion Gap: 14 (ref 3–16)
BUN: 11 mg/dL (ref 7–20)
CO2: 27 mmol/L (ref 21–32)
Calcium: 9 mg/dL (ref 8.3–10.6)
Chloride: 101 mmol/L (ref 99–110)
Creatinine: 0.8 mg/dL (ref 0.6–1.1)
GFR African American: >60 (ref >60)
GFR Non-African American: >60 (ref >60)
Glucose: 98 mg/dL (ref 70–99)
Potassium: 4.2 mmol/L (ref 3.5–5.1)
Sodium: 142 mmol/L (ref 136–145)

## 2015-09-06 NOTE — Telephone Encounter (Signed)
Please advise.

## 2015-09-06 NOTE — Telephone Encounter (Signed)
Rachael Mays called in with her weekly BP readings    09/23  AM 135/85  P 70  PM no reading  09/24  AM 164/98  P 66  PM 150/86  P 67  09/25  AM  136/74  P 73  PM 142/73  P  67  09/26  AM  138/98  P 70  PM  132/74  P  72  09/27  AM  159/86  P 68  PM  147/81  P  60  09/28 No reading, had a colonoscopy this day  09/29  AM  155/96  P  72  PM 160/75  P 81   09/30  AM  168/93  P  74    Patient has been having a lot of pain in her  Right hip ever since the Colonoscopy  She had a couple polyps removed possible pre-cancerous  cells  She is still having Diarrhea and severe pain   In her hip. They are sending her for stool samples  For possible C-Diff and an x-ray for possible   perforation in her colon.

## 2015-09-07 LAB — C DIFF TOXIN/ANTIGEN: C difficile Toxin, EIA: NEGATIVE

## 2015-09-09 NOTE — Telephone Encounter (Signed)
Okay 

## 2015-09-09 NOTE — Telephone Encounter (Signed)
Pt states about 3 weeks ago she was advised she could try PT or pain management - she would like to try the pain management route and needs the name of the clinic to go to    Please call and advise.

## 2015-09-09 NOTE — Telephone Encounter (Signed)
Ok for this?  Please advise

## 2015-09-10 MED ORDER — METOPROLOL TARTRATE 100 MG PO TABS
100 MG | ORAL_TABLET | Freq: Two times a day (BID) | ORAL | 3 refills | Status: DC
Start: 2015-09-10 — End: 2015-09-26

## 2015-09-10 NOTE — Telephone Encounter (Signed)
Patient has blood pressures in her chat.. I dont think you have seen these yet.Marland Kitchen Please advise

## 2015-09-10 NOTE — Telephone Encounter (Signed)
Increase metoprolol to 150mg  bid.  Continue log of bp and pulse for me x 1-2 weeks please.

## 2015-09-10 NOTE — Telephone Encounter (Signed)
Patient advised

## 2015-09-12 ENCOUNTER — Encounter: Admit: 2015-09-13 | Primary: Student in an Organized Health Care Education/Training Program

## 2015-09-12 DIAGNOSIS — M546 Pain in thoracic spine: Secondary | ICD-10-CM

## 2015-09-12 NOTE — ED Provider Notes (Signed)
[facility]  eMERGENCY dEPARTMENT eNCOUnter        Pt Name: Rachael Mays  MRN: [id number]  [place] May 31, 1956  Date of evaluation: 09/12/2015  Provider: Venora Maples, MD  PCP: Harvel Quale, MD  ED Attending: Rocco Serene, MD    CHIEF COMPLAINT       Chief Complaint   Patient presents with   ??? Rib Pain     pt presents with c/o rib pain in back and states it started about 3 wks ago.  pain is 8/10.       HISTORY OF PRESENT ILLNESS   (Location/Symptom, Timing/Onset, Context/Setting, Quality, Duration, Modifying Factors, Severity)  Note limiting factors.     Rachael Mays is a 59 y.o. female  Presenting with back pain, reports bilateral thoracic back pain, symptoms for the past 4-5 days, no worsening or no improvement, worse with movement and improves with rest, denies injury.  Pain is burning sensation, mild to moderate.  Denies fevers chills chest pain or shortness of breath.  No dysuria hematuria lower extremity or pain.     Nursing Notes were all reviewed and agreed with or any disagreements were addressed  in the HPI.    REVIEW OF SYSTEMS    (2-9 systems for level 4, 10 or more for level 5)     Review of Systems   Constitutional: Negative for appetite change, chills, fatigue and fever.   HENT: Negative for congestion and rhinorrhea.    Eyes: Negative for photophobia and visual disturbance.   Respiratory: Negative for cough, chest tightness and shortness of breath.    Cardiovascular: Negative for chest pain, palpitations and leg swelling.   Gastrointestinal: Negative for abdominal distention, abdominal pain, constipation, diarrhea, nausea and vomiting.   Genitourinary: Negative for difficulty urinating and dysuria.   Musculoskeletal: Positive for back pain. Negative for myalgias, neck pain and neck stiffness.   Skin: Negative for color change and rash.   Neurological: Negative for dizziness, weakness, light-headedness, numbness and headaches.       Positives and  Pertinent negatives as per HPI.  Except as noted above in the ROS, all other systems were reviewed and negative.       PAST MEDICAL HISTORY     Past Medical History   Diagnosis Date   ??? Anxiety    ??? Asthma    ??? Chronic back pain    ??? Deep vein thrombosis    ??? Depression    ??? GERD (gastroesophageal reflux disease)      NO LONGER SINCE LAP   ??? GERD (gastroesophageal reflux disease) 01/23/2009   ??? Hyperlipidemia      hx; resolved with lap band   ??? Hypertension    ??? Obesity      hx of; had lap band   ??? Pulmonary embolism [facility])          SURGICAL HISTORY       Past Surgical History   Procedure Laterality Date   ??? Colonoscopy     ??? Upper gastrointestinal endoscopy     ??? Cesarean section     ??? Cholecystectomy     ??? Hysterectomy     ??? Shoulder surgery     ??? Lap band  05/01/08     Dr. Nicole Cella   ??? Back surgery       neck  plates   ??? Colonoscopy  04/08/10   ??? Other surgical history  [place] Filter: curently in place   ??? Upper gastrointestinal endoscopy  05/19/13   ??? Other surgical history  07/05/13     lap band removal   ??? Upper gastrointestinal endoscopy  07/21/13     ESOPHAGEAL STENT PLACEMENT   ??? Upper gastrointestinal endoscopy  07/21/13     WITH EXCHANGE OF ESOPHAGEAL STENT   ??? Partial hysterectomy     ??? Upper gastrointestinal endoscopy N/A 07/31/2014     Esophagogastroduodenoscopy with esophageal balloon dilation         CURRENT MEDICATIONS       Previous Medications    ALBUTEROL (PROVENTIL HFA) 108 (90 BASE) MCG/ACT INHALER    Inhale 2 puffs into the lungs every 6 hours as needed for Wheezing    ALPRAZOLAM (XANAX) 0.5 MG TABLET    Take 1 tablet by mouth 2 times daily as needed for Anxiety    CALCIUM-VITAMIN D PO    Take by mouth    CETIRIZINE (ZYRTEC ALLERGY) 10 MG TABLET    Take 10 mg by mouth daily.    FLUOCINONIDE (LIDEX) 0.05 % CREAM    Apply topically 2 times daily.    HYDROCHLOROTHIAZIDE (HYDRODIURIL) 25 MG TABLET    Take 1 tablet by mouth daily    LISINOPRIL (PRINIVIL;ZESTRIL) 40 MG TABLET    Take 40 mg by mouth  daily    METAXALONE (SKELAXIN) 800 MG TABLET    TAKE ONE TABLET BY MOUTH THREE TIMES A DAY    METOCLOPRAMIDE (REGLAN) 5 MG TABLET    TAKE ONE TABLET BY MOUTH FOUR TIMES A DAY BEFORE MEALS AND AT BEDTIME    METOPROLOL (LOPRESSOR) 100 MG TABLET    Take 1.5 tablets by mouth 2 times daily    MONTELUKAST (SINGULAIR) 10 MG TABLET    TAKE 1 TABLET BY MOUTH DAILY    MULTIPLE VITAMINS-MINERALS (MULTIVITAMIN PO)    Take by mouth    NYSTATIN 100000 UNIT/GM POWD    APPLY 3 TIMES DAILY    OMEPRAZOLE (PRILOSEC) 40 MG CAPSULE    Take 1 capsule by mouth daily.    ONDANSETRON (ZOFRAN) 4 MG TABLET    Take 1 tablet by mouth daily as needed for Nausea or Vomiting    TRAZODONE (DESYREL) 100 MG TABLET    Take 1 tablet by mouth nightly    VENLAFAXINE (EFFEXOR XR) 150 MG XR CAPSULE    Take 1 capsule by mouth daily         ALLERGIES     Morphine and Talwin [pentazocine]    FAMILY HISTORY       Family History   Problem Relation Age of Onset   ??? Arthritis Mother    ??? Cancer Mother    ??? Depression Mother    ??? High Blood Pressure Mother    ??? Heart Disease Mother 86     MI    ??? Ovarian Cancer Mother 29   ??? Cancer Father 51     colon   ??? Heart Disease Maternal Grandmother    ??? Early Death Paternal Grandmother    ??? Heart Disease Paternal Grandfather    ??? Diabetes Other    ??? High Blood Pressure Other    ??? Obesity Other           SOCIAL HISTORY       Social History     Social History   ??? Marital status: Married     Spouse name: N/A   ??? Number of children: N/A   ???  Years of education: N/A     Social History Main Topics   ??? Smoking status: Former Smoker     Packs/day: 0.00     Years: 35.00     Types: Cigarettes     Quit date: 08/07/2012   ??? Smokeless tobacco: Never Used   ??? Alcohol use No   ??? Drug use: No   ??? Sexual activity: Not Asked     Other Topics Concern   ??? None     Social History Narrative    ** Merged History Encounter **            SCREENINGS             PHYSICAL EXAM    (up to 7 for level 4, 8 or more for level 5)   ED Triage Vitals   BP Temp  Temp src Pulse Resp SpO2 Height Weight   09/12/15 2230 09/12/15 2230 -- 09/12/15 2230 09/12/15 2230 09/12/15 2230 09/12/15 2230 09/12/15 2230   178/100 99.1 ??F (37.3 ??C)  66 18 95 % 5\' 6"  (1.676 m) 290 lb (131.5 kg)       Physical Exam   Constitutional: She is oriented to person, place, and time. She appears well-developed and well-nourished. No distress.   HENT:   Head: Normocephalic and atraumatic.   Eyes: Conjunctivae and EOM are normal. Pupils are equal, round, and reactive to light. Right eye exhibits no discharge. Left eye exhibits no discharge.   Neck: Normal range of motion. Neck supple.   Cardiovascular: Regular rhythm, normal heart sounds and intact distal pulses.  Exam reveals no gallop and no friction rub.    No murmur heard.  Pulmonary/Chest: Effort normal and breath sounds normal. No respiratory distress. She has no wheezes. She has no rales.   Abdominal: Soft. Bowel sounds are normal. She exhibits no distension. There is no tenderness. There is no rebound, no guarding and no CVA tenderness.   Musculoskeletal: Normal range of motion. She exhibits no edema or deformity.        Cervical back: Normal.        Thoracic back: She exhibits tenderness. She exhibits normal range of motion, no bony tenderness, no swelling, no edema, no deformity, no laceration and no spasm.        Lumbar back: Normal.   Neurological: She is oriented to person, place, and time. She has normal strength. No sensory deficit. Coordination and gait normal.   Skin: Skin is warm. She is not diaphoretic.   Nursing note and vitals reviewed.      EKG RESULTS      -        CRITICAL CARE TIME   N/A      EMERGENCY DEPARTMENT COURSE and DIFFERENTIAL DIAGNOSIS/MDM:   MDM: patient presenting with back pain, exam was consistent with muscular skeletal back pain, chest x-ray no acute findings, laboratory workup negative for UTI, pyelonephritis, AKI: Early signs of inflammation or infection.  Symptoms improved.  Vital signs remain stable.  Patient  discharged for symptom control and instructions for outpatient management follow-up.    Patient was given the following medications:  Medications   oxyCODONE-acetaminophen (PERCOCET) 5-325 MG per tablet 2 tablet (2 tablets Oral Given 09/13/15 0042)   cyclobenzaprine (FLEXERIL) tablet 10 mg (10 mg Oral Given 09/13/15 0211)         Test results, diagnosis and treatment options were discussed with the patient and/or the family, shared decision making was incorporated to conform to patient  goals of care, the patient and/or verbalized understanding and agreement with plan. Resources were given for follow-up, we also discussed returning to the Emergency Department immediately if new or worsening symptoms occur. We have discussed the symptoms which are most concerning that necessitate immediate return.        FINAL IMPRESSION      1. Acute bilateral thoracic back pain            (Please note that portions of this note were completed with a voice recognition program.  Efforts were made to edit the dictations but occasionally words are mis-transcribed.)    Venora Maples, MD (electronically signed)             Rocco Serene, MD  09/13/15 [phone]

## 2015-09-13 ENCOUNTER — Inpatient Hospital Stay
Admit: 2015-09-13 | Discharge: 2015-09-13 | Disposition: A | Discharge status: Home / Self Care | Attending: Emergency Medicine

## 2015-09-13 LAB — CBC WITH AUTO DIFFERENTIAL
Basophils %: 1.5 %
Basophils Absolute: 0.1 10*3/uL (ref 0.0–0.2)
Eosinophils %: 5.4 %
Eosinophils Absolute: 0.5 10*3/uL (ref 0.0–0.6)
Hematocrit: 42.3 % (ref 36.0–48.0)
Hemoglobin: 14.1 g/dL (ref 12.0–16.0)
Lymphocytes %: 38.9 %
Lymphocytes Absolute: 3.5 10*3/uL (ref 1.0–5.1)
MCH: 28.5 pg (ref 26.0–34.0)
MCHC: 33.4 g/dL (ref 31.0–36.0)
MCV: 85.5 fL (ref 80.0–100.0)
MPV: 9.2 fL (ref 5.0–10.5)
Monocytes %: 9 %
Monocytes Absolute: 0.8 10*3/uL (ref 0.0–1.3)
Neutrophils %: 45.2 %
Neutrophils Absolute: 4 10*3/uL (ref 1.7–7.7)
Platelets: 171 10*3/uL (ref 135–450)
RBC: 4.95 M/uL (ref 4.00–5.20)
RDW: 14.7 % (ref 12.4–15.4)
WBC: 8.9 10*3/uL (ref 4.0–11.0)

## 2015-09-13 LAB — URINALYSIS WITH REFLEX TO CULTURE
Bilirubin Urine: NEGATIVE
Blood, Urine: NEGATIVE
Glucose, Ur: NEGATIVE mg/dL
Ketones, Urine: NEGATIVE mg/dL
Leukocyte Esterase, Urine: NEGATIVE
Nitrite, Urine: NEGATIVE
Protein, UA: NEGATIVE mg/dL
Specific Gravity, UA: 1.022 (ref 1.005–1.030)
Urobilinogen, Urine: 1 E.U./dL (ref <2.0)
pH, UA: 6 (ref 5.0–8.0)

## 2015-09-13 LAB — BASIC METABOLIC PANEL
Anion Gap: 12 (ref 3–16)
BUN: 15 mg/dL (ref 7–20)
CO2: 31 mmol/L (ref 21–32)
Calcium: 10.1 mg/dL (ref 8.3–10.6)
Chloride: 100 mmol/L (ref 99–110)
Creatinine: 0.9 mg/dL (ref 0.6–1.1)
GFR African American: >60 (ref >60)
GFR Non-African American: >60 (ref >60)
Glucose: 131 mg/dL — ABNORMAL HIGH (ref 70–99)
Potassium: 4 mmol/L (ref 3.5–5.1)
Sodium: 143 mmol/L (ref 136–145)

## 2015-09-13 MED ORDER — TRAMADOL HCL 50 MG PO TABS
50 MG | ORAL_TABLET | Freq: Four times a day (QID) | ORAL | 0 refills | Status: AC | PRN
Start: 2015-09-13 — End: 2015-09-23

## 2015-09-13 MED ORDER — CYCLOBENZAPRINE HCL 10 MG PO TABS
10 MG | ORAL_TABLET | Freq: Three times a day (TID) | ORAL | 0 refills | Status: AC | PRN
Start: 2015-09-13 — End: 2015-09-23

## 2015-09-13 MED ORDER — CYCLOBENZAPRINE HCL 10 MG PO TABS
10 MG | Freq: Once | ORAL | Status: AC
Start: 2015-09-13 — End: 2015-09-13
  Administered 2015-09-13: 06:00:00 10 mg via ORAL

## 2015-09-13 MED ORDER — OXYCODONE-ACETAMINOPHEN 5-325 MG PO TABS
5-325 MG | Freq: Once | ORAL | Status: AC
Start: 2015-09-13 — End: 2015-09-13
  Administered 2015-09-13: 05:00:00 2 via ORAL

## 2015-09-13 MED ORDER — NAPROXEN 500 MG PO TABS
500 MG | ORAL_TABLET | Freq: Two times a day (BID) | ORAL | 0 refills | Status: DC
Start: 2015-09-13 — End: 2015-09-18

## 2015-09-13 MED FILL — OXYCODONE-ACETAMINOPHEN 5-325 MG PO TABS: 5-325 MG | ORAL | Qty: 2

## 2015-09-13 MED FILL — CYCLOBENZAPRINE HCL 10 MG PO TABS: 10 MG | ORAL | Qty: 1

## 2015-09-13 NOTE — Care Coordination-Inpatient (Signed)
Ambulatory Care Coordination  ED Follow up Call    Reason for ED visit:  Acute bilateral thoracic back pain   Outgoing telephone call, no answer. Left message to call. Reminded to call the office for a follow up appointment.    Future Appointments  Date Time Provider [place]   11/11/2015 2:15 PM Tobin Chad, MD [facility]   03/23/2016 11:00 AM Francisca December, MD [facility]

## 2015-09-17 ENCOUNTER — Emergency Department: Admit: 2015-09-18 | Primary: Student in an Organized Health Care Education/Training Program

## 2015-09-17 ENCOUNTER — Inpatient Hospital Stay
Admission: EM | Admit: 2015-09-18 | Discharge: 2015-09-21 | Disposition: A | Discharge status: Home / Self Care | Payer: PRIVATE HEALTH INSURANCE | Source: Home / Self Care | Admitting: Family Medicine

## 2015-09-17 ENCOUNTER — Encounter: Admit: 2015-09-17 | Primary: Student in an Organized Health Care Education/Training Program

## 2015-09-17 LAB — CBC WITH AUTO DIFFERENTIAL
Basophils %: 1 %
Basophils Absolute: 0.1 10*3/uL (ref 0.0–0.2)
Eosinophils %: 6.4 %
Eosinophils Absolute: 0.5 10*3/uL (ref 0.0–0.6)
Hematocrit: 40.9 % (ref 36.0–48.0)
Hemoglobin: 13.4 g/dL (ref 12.0–16.0)
Lymphocytes %: 37.8 %
Lymphocytes Absolute: 3 10*3/uL (ref 1.0–5.1)
MCH: 28.4 pg (ref 26.0–34.0)
MCHC: 32.6 g/dL (ref 31.0–36.0)
MCV: 87 fL (ref 80.0–100.0)
MPV: 9.4 fL (ref 5.0–10.5)
Monocytes %: 9.8 %
Monocytes Absolute: 0.8 10*3/uL (ref 0.0–1.3)
Neutrophils %: 45 %
Neutrophils Absolute: 3.6 10*3/uL (ref 1.7–7.7)
Platelets: 155 10*3/uL (ref 135–450)
RBC: 4.7 M/uL (ref 4.00–5.20)
RDW: 14.7 % (ref 12.4–15.4)
WBC: 8 10*3/uL (ref 4.0–11.0)

## 2015-09-17 NOTE — ED Provider Notes (Signed)
[facility]  eMERGENCY dEPARTMENT eNCOUnter        Pt Name: Rachael Mays  MRN: [id number]  [place] [birth year]  Date of evaluation: 09/17/2015  Provider: Catha Gosselin, DO  PCP: Acquanetta Sit SLOUGH, MD      [place]       Chief Complaint   Patient presents with   ??? Chest Pain     pt presents with c/o sharp pain with deep breaths and states she cannot get a deep breath.  pt was seen here for samething on the 09/12/15--dx with thoracic back pain.  pain is 8/10.       HISTORY OF PRESENT ILLNESS   (Location/Symptom, Timing/Onset, Context/Setting, Quality, Duration, Modifying Factors, Severity)  Note limiting factors.     Rachael Mays is a 59 y.o. female  History of asthma hypertension blood clots presents to the ED with chest pain dyspnea since afternoon.  Symptoms are worse with moving around better with rest.  Patient has been on anticoagulation the past mental taken off of it after 6 months.  Denies fevers chills vomiting but swelling noted.  No other pain or  complaints  Nursing Notes were all reviewed and agreed with or any disagreements were addressed  in the HPI.    REVIEW OF SYSTEMS    (2-9 systems for level 4, 10 or more for level 5)     Review of Systems    Positives and Pertinent negatives as per HPI.  Except as noted above in the ROS, all other systems were reviewed and negative.       PAST MEDICAL HISTORY     Past Medical History   Diagnosis Date   ??? Anxiety    ??? Asthma    ??? Chronic back pain    ??? Deep vein thrombosis    ??? Depression    ??? GERD (gastroesophageal reflux disease)      NO LONGER SINCE LAP   ??? GERD (gastroesophageal reflux disease) 01/23/2009   ??? Hyperlipidemia      hx; resolved with lap band   ??? Hypertension    ??? Obesity      hx of; had lap band   ??? Pulmonary embolism [facility])          SURGICAL HISTORY       Past Surgical History   Procedure Laterality Date   ??? Colonoscopy     ??? Upper gastrointestinal endoscopy     ??? Cesarean section     ???  Cholecystectomy     ??? Hysterectomy     ??? Shoulder surgery     ??? Lap band  05/01/08     Dr. Nicole Cella   ??? Back surgery       neck  plates   ??? Colonoscopy  04/08/10   ??? Other surgical history       [place] Filter: curently in place   ??? Upper gastrointestinal endoscopy  05/19/13   ??? Other surgical history  07/05/13     lap band removal   ??? Upper gastrointestinal endoscopy  07/21/13     ESOPHAGEAL STENT PLACEMENT   ??? Upper gastrointestinal endoscopy  07/21/13     WITH EXCHANGE OF ESOPHAGEAL STENT   ??? Partial hysterectomy     ??? Upper gastrointestinal endoscopy N/A 07/31/2014     Esophagogastroduodenoscopy with esophageal balloon dilation         CURRENT MEDICATIONS       Previous Medications  ALBUTEROL (PROVENTIL HFA) 108 (90 BASE) MCG/ACT INHALER    Inhale 2 puffs into the lungs every 6 hours as needed for Wheezing    ALPRAZOLAM (XANAX) 0.5 MG TABLET    Take 1 tablet by mouth 2 times daily as needed for Anxiety    CALCIUM-VITAMIN D PO    Take by mouth    CETIRIZINE (ZYRTEC ALLERGY) 10 MG TABLET    Take 10 mg by mouth daily.    CYCLOBENZAPRINE (FLEXERIL) 10 MG TABLET    Take 1 tablet by mouth 3 times daily as needed for Muscle spasms    FLUOCINONIDE (LIDEX) 0.05 % CREAM    Apply topically 2 times daily.    HYDROCHLOROTHIAZIDE (HYDRODIURIL) 25 MG TABLET    Take 1 tablet by mouth daily    LISINOPRIL (PRINIVIL;ZESTRIL) 40 MG TABLET    Take 40 mg by mouth daily    METAXALONE (SKELAXIN) 800 MG TABLET    TAKE ONE TABLET BY MOUTH THREE TIMES A DAY    METOCLOPRAMIDE (REGLAN) 5 MG TABLET    TAKE ONE TABLET BY MOUTH FOUR TIMES A DAY BEFORE MEALS AND AT BEDTIME    METOPROLOL (LOPRESSOR) 100 MG TABLET    Take 1.5 tablets by mouth 2 times daily    MONTELUKAST (SINGULAIR) 10 MG TABLET    TAKE 1 TABLET BY MOUTH DAILY    MULTIPLE VITAMINS-MINERALS (MULTIVITAMIN PO)    Take by mouth    NAPROXEN (NAPROSYN) 500 MG TABLET    Take 1 tablet by mouth 2 times daily (with meals)    NYSTATIN 100000 UNIT/GM POWD    APPLY 3 TIMES DAILY    OMEPRAZOLE  (PRILOSEC) 40 MG CAPSULE    Take 1 capsule by mouth daily.    ONDANSETRON (ZOFRAN) 4 MG TABLET    Take 1 tablet by mouth daily as needed for Nausea or Vomiting    TRAMADOL (ULTRAM) 50 MG TABLET    Take 1 tablet by mouth every 6 hours as needed for Pain    TRAZODONE (DESYREL) 100 MG TABLET    Take 1 tablet by mouth nightly    VENLAFAXINE (EFFEXOR XR) 150 MG XR CAPSULE    Take 1 capsule by mouth daily       ALLERGIES     Morphine and Talwin [pentazocine]    FAMILY HISTORY       Family History   Problem Relation Age of Onset   ??? Arthritis Mother    ??? Cancer Mother    ??? Depression Mother    ??? High Blood Pressure Mother    ??? Heart Disease Mother 31     MI    ??? Ovarian Cancer Mother 74   ??? Cancer Father 68     colon   ??? Heart Disease Maternal Grandmother    ??? Early Death Paternal Grandmother    ??? Heart Disease Paternal Grandfather    ??? Diabetes Other    ??? High Blood Pressure Other    ??? Obesity Other           SOCIAL HISTORY       Social History     Social History   ??? Marital status: Married     Spouse name: N/A   ??? Number of children: N/A   ??? Years of education: N/A     Social History Main Topics   ??? Smoking status: Former Smoker     Packs/day: 0.00     Years: 35.00     Types:  Cigarettes     Quit date: 08/07/2012   ??? Smokeless tobacco: Never Used   ??? Alcohol use No   ??? Drug use: No   ??? Sexual activity: Not Asked     Other Topics Concern   ??? None     Social History Narrative    ** Merged History Encounter **            SCREENINGS             PHYSICAL EXAM    (up to 7 for level 4, 8 or more for level 5)   ED Triage Vitals   BP Temp Temp src Pulse Resp SpO2 Height Weight   09/17/15 1933 09/17/15 1933 -- 09/17/15 1933 09/17/15 1933 09/17/15 1933 09/17/15 1933 09/17/15 1933   161/80 97.9 ??F (36.6 ??C)  60 18 96 % 5\' 6"  (1.676 m) 290 lb (131.5 kg)       Physical Exam   Constitutional: She is oriented to person, place, and time. She appears well-developed and well-nourished. No distress.   HENT:   Head: Normocephalic and  atraumatic.   Eyes: EOM are normal.   Neck: Normal range of motion. No tracheal deviation present.   Cardiovascular: Normal rate and regular rhythm.    Pulmonary/Chest: Effort normal and breath sounds normal. No stridor. No respiratory distress.   Abdominal: Soft. She exhibits no distension.   Musculoskeletal: She exhibits no edema or tenderness.   Neurological: She is alert and oriented to person, place, and time.   Skin: Skin is warm and dry.   Vitals reviewed.      DIAGNOSTIC RESULTS   LABS:    Labs Reviewed   COMPREHENSIVE METABOLIC PANEL - Abnormal; Notable for the following:        Result Value    Glucose 103 (*)     Albumin/Globulin Ratio 1.0 (*)     ALT 86 (*)     AST 68 (*)     All other components within normal limits   BRAIN NATRIURETIC PEPTIDE - Abnormal; Notable for the following:     Pro-BNP 349 (*)     All other components within normal limits   D-DIMER, QUANTITATIVE - Abnormal; Notable for the following:     D-Dimer, Quant 311 (*)     All other components within normal limits   CBC WITH AUTO DIFFERENTIAL   PROTIME-INR   APTT   TROPONIN   TROPONIN   URINE RT REFLEX TO CULTURE       All other labs were within normal range or not returned as of this dictation.    EKG: All EKG's are interpreted by the Emergency Department Physician who either signs or Co-signs this chart in the absence of a cardiologist.    Normal sinus rhythm rate 62 normal intervals no ischemic changes    RADIOLOGY:   Non-plain film images such as CT, Ultrasound and MRI are read by the radiologist. Plain radiographic images are visualized and preliminarily interpreted by the  ED Provider with the below findings:        Interpretation per the Radiologist below, if available at the time of this note:    CTA Pulmonary With Contrast   Final Result   No evidence of pulmonary embolism or acute pulmonary abnormality.         XR Chest Standard TWO VW   Final Result   Mild bronchial wall thickening centrally, increased since previous exam.  No    focal consolidation.  PROCEDURES   Unless otherwise noted below, none     Procedures    CRITICAL CARE TIME   N/A    CONSULTS:  IP CONSULT TO PRIMARY CARE PROVIDER    EMERGENCY DEPARTMENT COURSE and DIFFERENTIAL DIAGNOSIS/MDM:   Vitals:    Vitals:    09/17/15 2302 09/17/15 2320 09/17/15 2332 09/17/15 2347   BP: 132/61 136/72 132/72 127/66   Pulse: 63 72 67 63   Resp: 15 26 14 23    Temp:       SpO2: 94% 93% 97% 93%   Weight:       Height:           Patient was given the following medications:  Medications   oxyCODONE-acetaminophen (PERCOCET) 5-325 MG per tablet 1 tablet (not administered)   iopamidol (ISOVUE-370) 76 % injection 100 mL (100 mLs Intravenous Given 09/17/15 2309)       Patient with history of asthma blood clotting presents with chest pain shortness of breath patient noted to have elevated d-dimer will check CTA and reassess anticipate admission for ACS rule out CT negative    CTA negative for pulmonary embolism.  Discussed with covering physician patient to be admitted for ACS rule out    The patient tolerated their visit well.   The patient and / or the family were informed of the results of any tests, a time was given to answer questions.    FINAL IMPRESSION      1. Chest pain, unspecified type    2. Dyspnea and respiratory abnormalities          DISPOSITION/PLAN   DISPOSITION Decision to Admit    PATIENT REFERRED TO:  No follow-up provider specified.    DISCHARGE MEDICATIONS:  New Prescriptions    No medications on file       DISCONTINUED MEDICATIONS:  Discontinued Medications    No medications on file              (Please note that portions of this note were completed with a voice recognition program.  Efforts were made to edit the dictations but occasionally words are mis-transcribed.)    Catha Gosselin, DO (electronically signed)           Catha Gosselin, DO  09/18/15 0015

## 2015-09-17 NOTE — ED Provider Notes (Signed)
I performed a medical screening history and physical exam on this patient.    HISTORY OF PRESENT ILLNESS  Rachael Mays is a 59 y.o. female presents with chest pain.  The chest pain is sharp and worse with deep breathing.  She states it became much worse today, although she has had it for several days.  It is localized to the middle of her chest.  She does feel short of breath as well.Marland Kitchen      PHYSICAL EXAM  ED Triage Vitals   BP Temp Temp src Pulse Resp SpO2 Height Weight   09/17/15 1933 09/17/15 1933 -- 09/17/15 1933 09/17/15 1933 09/17/15 1933 09/17/15 1933 09/17/15 1933   161/80 97.9 ??F (36.6 ??C)  60 18 96 % 5\' 6"  (1.676 m) 290 lb (131.5 kg)       On exam, the patient appears uncomfortable, holding the middle of her chest.  She does not appear to be in respiratory distress.  She is nontoxic.  She has mildly diminished breath sounds bilaterally, but her lungs are relatively clear.  Her heart is regular rate and rhythm.  Gait is steady.  Coordination is normal.  Face symmetric.  Speech is normal.  Mucous membranes are moist.           Roxan Diesel, MD  09/17/15 1950

## 2015-09-18 DIAGNOSIS — G4733 Obstructive sleep apnea (adult) (pediatric): Secondary | ICD-10-CM

## 2015-09-18 DIAGNOSIS — R079 Chest pain, unspecified: Principal | ICD-10-CM

## 2015-09-18 LAB — COMPREHENSIVE METABOLIC PANEL
ALT: 86 U/L — ABNORMAL HIGH (ref 10–40)
AST: 68 U/L — ABNORMAL HIGH (ref 15–37)
Albumin/Globulin Ratio: 1 — ABNORMAL LOW (ref 1.1–2.2)
Albumin: 3.7 g/dL (ref 3.4–5.0)
Alkaline Phosphatase: 88 U/L (ref 40–129)
Anion Gap: 12 (ref 3–16)
BUN: 15 mg/dL (ref 7–20)
CO2: 29 mmol/L (ref 21–32)
Calcium: 9.4 mg/dL (ref 8.3–10.6)
Chloride: 100 mmol/L (ref 99–110)
Creatinine: 0.9 mg/dL (ref 0.6–1.1)
GFR African American: >60 (ref >60)
GFR Non-African American: >60 (ref >60)
Globulin: 3.6 g/dL
Glucose: 103 mg/dL — ABNORMAL HIGH (ref 70–99)
Potassium: 4.1 mmol/L (ref 3.5–5.1)
Sodium: 141 mmol/L (ref 136–145)
Total Bilirubin: 0.4 mg/dL (ref 0.0–1.0)
Total Protein: 7.3 g/dL (ref 6.4–8.2)

## 2015-09-18 LAB — PROTIME-INR
INR: 0.99 (ref 0.85–1.16)
Protime: 11.3 s (ref 9.8–13.0)

## 2015-09-18 LAB — URINALYSIS WITH REFLEX TO CULTURE
Bilirubin Urine: NEGATIVE
Blood, Urine: NEGATIVE
Glucose, Ur: NEGATIVE mg/dL
Ketones, Urine: NEGATIVE mg/dL
Leukocyte Esterase, Urine: NEGATIVE
Nitrite, Urine: NEGATIVE
Protein, UA: NEGATIVE mg/dL
Specific Gravity, UA: 1.019 (ref 1.005–1.030)
Urobilinogen, Urine: 1 E.U./dL (ref <2.0)
pH, UA: 7 (ref 5.0–8.0)

## 2015-09-18 LAB — TROPONIN
Troponin: <0.01 ng/mL (ref <0.01)
Troponin: <0.01 ng/mL (ref <0.01)
Troponin: <0.01 ng/mL (ref <0.01)
Troponin: <0.01 ng/mL (ref <0.01)
Troponin: <0.01 ng/mL (ref <0.01)

## 2015-09-18 LAB — APTT: aPTT: 32.3 s (ref 25.2–36.4)

## 2015-09-18 LAB — BRAIN NATRIURETIC PEPTIDE: Pro-BNP: 349 pg/mL — ABNORMAL HIGH (ref 0–124)

## 2015-09-18 LAB — NM MYOCARDIAL SPECT REST EXERCISE OR RX: Left Ventricular Ejection Fraction: 81

## 2015-09-18 LAB — D-DIMER, QUANTITATIVE: D-Dimer, Quant: 311 ng/mL DDU — ABNORMAL HIGH (ref 0–229)

## 2015-09-18 MED ORDER — NITROGLYCERIN 0.4 MG SL SUBL
0.4 MG | SUBLINGUAL | Status: DC | PRN
Start: 2015-09-18 — End: 2015-09-21

## 2015-09-18 MED ORDER — PREDNISONE 20 MG PO TABS
20 MG | ORAL_TABLET | Freq: Every day | ORAL | 0 refills | Status: DC
Start: 2015-09-18 — End: 2015-09-23

## 2015-09-18 MED ORDER — ACETAMINOPHEN 325 MG PO TABS
325 MG | ORAL | Status: DC | PRN
Start: 2015-09-18 — End: 2015-09-21

## 2015-09-18 MED ORDER — TRAZODONE HCL 50 MG PO TABS
50 MG | Freq: Every evening | ORAL | Status: DC
Start: 2015-09-18 — End: 2015-09-21
  Administered 2015-09-18 – 2015-09-21 (×4): 100 mg via ORAL

## 2015-09-18 MED ORDER — MAGNESIUM HYDROXIDE 400 MG/5ML PO SUSP
400 MG/5ML | Freq: Every day | ORAL | Status: DC | PRN
Start: 2015-09-18 — End: 2015-09-21
  Administered 2015-09-20: 02:00:00 30 mL via ORAL

## 2015-09-18 MED ORDER — DOCUSATE SODIUM 100 MG PO CAPS
100 MG | Freq: Two times a day (BID) | ORAL | Status: DC | PRN
Start: 2015-09-18 — End: 2015-09-21

## 2015-09-18 MED ORDER — TRAMADOL HCL 50 MG PO TABS
50 MG | Freq: Four times a day (QID) | ORAL | Status: DC | PRN
Start: 2015-09-18 — End: 2015-09-21

## 2015-09-18 MED ORDER — PANTOPRAZOLE SODIUM 40 MG PO TBEC
40 MG | Freq: Every day | ORAL | Status: DC
Start: 2015-09-18 — End: 2015-09-21
  Administered 2015-09-18 – 2015-09-21 (×4): 40 mg via ORAL

## 2015-09-18 MED ORDER — TECHNETIUM TC 99M TETROFOSMIN IV KIT
Freq: Once | INTRAVENOUS | Status: AC | PRN
Start: 2015-09-18 — End: 2015-09-18
  Administered 2015-09-18: 18:00:00 35 via INTRAVENOUS

## 2015-09-18 MED ORDER — PREDNISONE 20 MG PO TABS
20 MG | Freq: Every day | ORAL | Status: DC
Start: 2015-09-18 — End: 2015-09-21
  Administered 2015-09-18 – 2015-09-21 (×4): 40 mg via ORAL

## 2015-09-18 MED ORDER — OXYCODONE HCL 5 MG PO TABS
5 MG | ORAL | Status: DC | PRN
Start: 2015-09-18 — End: 2015-09-21

## 2015-09-18 MED ORDER — MONTELUKAST SODIUM 10 MG PO TABS
10 MG | Freq: Every day | ORAL | Status: DC
Start: 2015-09-18 — End: 2015-09-21
  Administered 2015-09-19 – 2015-09-21 (×3): 10 mg via ORAL

## 2015-09-18 MED ORDER — CETIRIZINE HCL 10 MG PO TABS
10 MG | Freq: Every day | ORAL | Status: DC
Start: 2015-09-18 — End: 2015-09-21
  Administered 2015-09-18 – 2015-09-21 (×4): 10 mg via ORAL

## 2015-09-18 MED ORDER — HYDROMORPHONE 0.5MG/0.5ML IJ SOLN
1 MG/ML | Status: DC | PRN
Start: 2015-09-18 — End: 2015-09-21
  Administered 2015-09-18 – 2015-09-21 (×21): 0.5 mg via INTRAVENOUS

## 2015-09-18 MED ORDER — REGADENOSON 0.4 MG/5ML IV SOLN
0.4 MG/5ML | Freq: Once | INTRAVENOUS | Status: AC | PRN
Start: 2015-09-18 — End: 2015-09-18
  Administered 2015-09-18: 18:00:00 0.4 mg via INTRAVENOUS

## 2015-09-18 MED ORDER — BISACODYL 10 MG RE SUPP
10 MG | Freq: Every day | RECTAL | Status: DC | PRN
Start: 2015-09-18 — End: 2015-09-21

## 2015-09-18 MED ORDER — CYCLOBENZAPRINE HCL 10 MG PO TABS
10 MG | Freq: Three times a day (TID) | ORAL | Status: DC | PRN
Start: 2015-09-18 — End: 2015-09-21

## 2015-09-18 MED ORDER — HYDROMORPHONE 0.5MG/0.5ML IJ SOLN
1 MG/ML | Status: DC | PRN
Start: 2015-09-18 — End: 2015-09-21

## 2015-09-18 MED ORDER — HYDROCHLOROTHIAZIDE 25 MG PO TABS
25 MG | Freq: Every day | ORAL | Status: DC
Start: 2015-09-18 — End: 2015-09-21
  Administered 2015-09-19 – 2015-09-21 (×2): 25 mg via ORAL

## 2015-09-18 MED ORDER — ALPRAZOLAM 0.5 MG PO TABS
0.5 MG | Freq: Two times a day (BID) | ORAL | Status: DC | PRN
Start: 2015-09-18 — End: 2015-09-21
  Administered 2015-09-19 – 2015-09-21 (×3): 0.5 mg via ORAL

## 2015-09-18 MED ORDER — ALBUTEROL SULFATE HFA 108 (90 BASE) MCG/ACT IN AERS
108 (90 Base) MCG/ACT | Freq: Four times a day (QID) | RESPIRATORY_TRACT | Status: DC | PRN
Start: 2015-09-18 — End: 2015-09-21

## 2015-09-18 MED ORDER — HYDROMORPHONE 0.5MG/0.5ML IJ SOLN
1 MG/ML | Status: AC
Start: 2015-09-18 — End: 2015-09-18

## 2015-09-18 MED ORDER — ENOXAPARIN SODIUM 40 MG/0.4ML SC SOLN
40 MG/0.4ML | Freq: Every day | SUBCUTANEOUS | Status: DC
Start: 2015-09-18 — End: 2015-09-21

## 2015-09-18 MED ORDER — METOCLOPRAMIDE HCL 10 MG PO TABS
10 MG | Freq: Four times a day (QID) | ORAL | Status: DC
Start: 2015-09-18 — End: 2015-09-21
  Administered 2015-09-18 – 2015-09-21 (×11): 5 mg via ORAL

## 2015-09-18 MED ORDER — VENLAFAXINE HCL ER 150 MG PO CP24
150 MG | Freq: Every day | ORAL | Status: DC
Start: 2015-09-18 — End: 2015-09-21
  Administered 2015-09-18 – 2015-09-21 (×4): 150 mg via ORAL

## 2015-09-18 MED ORDER — ONDANSETRON HCL 4 MG/2ML IJ SOLN
4 MG/2ML | Freq: Four times a day (QID) | INTRAMUSCULAR | Status: DC | PRN
Start: 2015-09-18 — End: 2015-09-21
  Administered 2015-09-20: 20:00:00 4 mg via INTRAVENOUS

## 2015-09-18 MED ORDER — IOPAMIDOL 76 % IV SOLN
76 % | Freq: Once | INTRAVENOUS | Status: AC | PRN
Start: 2015-09-18 — End: 2015-09-17
  Administered 2015-09-18: 03:00:00 100 mL via INTRAVENOUS

## 2015-09-18 MED ORDER — NORMAL SALINE FLUSH 0.9 % IV SOLN
0.9 % | INTRAVENOUS | Status: DC | PRN
Start: 2015-09-18 — End: 2015-09-21
  Administered 2015-09-18 – 2015-09-19 (×2): 10 mL via INTRAVENOUS

## 2015-09-18 MED ORDER — OXYCODONE-ACETAMINOPHEN 5-325 MG PO TABS
5-325 MG | Freq: Once | ORAL | Status: DC
Start: 2015-09-18 — End: 2015-09-21

## 2015-09-18 MED ORDER — LISINOPRIL 20 MG PO TABS
20 MG | Freq: Every day | ORAL | Status: DC
Start: 2015-09-18 — End: 2015-09-21
  Administered 2015-09-18 – 2015-09-21 (×4): 40 mg via ORAL

## 2015-09-18 MED ORDER — METOPROLOL TARTRATE 50 MG PO TABS
50 MG | Freq: Two times a day (BID) | ORAL | Status: DC
Start: 2015-09-18 — End: 2015-09-21
  Administered 2015-09-19 – 2015-09-21 (×4): 150 mg via ORAL

## 2015-09-18 MED ORDER — NORMAL SALINE FLUSH 0.9 % IV SOLN
0.9 % | Freq: Two times a day (BID) | INTRAVENOUS | Status: DC
Start: 2015-09-18 — End: 2015-09-21
  Administered 2015-09-18 – 2015-09-21 (×7): 10 mL via INTRAVENOUS

## 2015-09-18 MED FILL — NORMAL SALINE FLUSH 0.9 % IV SOLN: 0.9 % | INTRAVENOUS | Qty: 10

## 2015-09-18 MED FILL — METOCLOPRAMIDE HCL 10 MG PO TABS: 10 MG | ORAL | Qty: 1

## 2015-09-18 MED FILL — HYDROMORPHONE HCL 1 MG/ML IJ SOLN: 1 MG/ML | INTRAMUSCULAR | Qty: 0.5

## 2015-09-18 MED FILL — PROAIR HFA 108 (90 BASE) MCG/ACT IN AERS: 108 (90 Base) MCG/ACT | RESPIRATORY_TRACT | Qty: 8.5

## 2015-09-18 MED FILL — CETIRIZINE HCL 10 MG PO TABS: 10 MG | ORAL | Qty: 1

## 2015-09-18 MED FILL — TRAZODONE HCL 50 MG PO TABS: 50 MG | ORAL | Qty: 2

## 2015-09-18 MED FILL — ISOVUE-370 76 % IV SOLN: 76 % | INTRAVENOUS | Qty: 100

## 2015-09-18 MED FILL — PREDNISONE 20 MG PO TABS: 20 MG | ORAL | Qty: 2

## 2015-09-18 MED FILL — EFFEXOR XR 150 MG PO CP24: 150 MG | ORAL | Qty: 1

## 2015-09-18 MED FILL — LISINOPRIL 20 MG PO TABS: 20 MG | ORAL | Qty: 2

## 2015-09-18 MED FILL — OXYCODONE-ACETAMINOPHEN 5-325 MG PO TABS: 5-325 MG | ORAL | Qty: 1

## 2015-09-18 MED FILL — PANTOPRAZOLE SODIUM 40 MG PO TBEC: 40 MG | ORAL | Qty: 1

## 2015-09-18 NOTE — Progress Notes (Signed)
RESPIRATORY THERAPY ASSESSMENT    Rachael Mays   [id number]  [birth year]      Recent Surgical Procedures:  none  Pulmonary History:  none  Home Respiratory Therapy:  none  Home Oxygen Therapy:  none  Current Respiratory Therapy:  mdi albuterol Q6 PRN  Vitals:    09/18/15 0315   BP:    Pulse:    Resp:    Temp:    SpO2: 96%       MPVC:818 mL  Actual VC: UTA mL  Sputum:  ,  ,      ASSESSMENT OF RESPIRATORY SEVERITY INDEX (RSI)    Patients with orders for inhalation medications, oxygen, or any therapeutic treatment modality will be placed on Respiratory Protocol.  They will be assessed with the first treatment and at least every 72 hours thereafter.  The following severity scale will be used to determine frequency of treatment intervention.    Respiratory History: Smoking History Less than 1ppd or less than 15 pack year = 1    Recent Surgical History: None = 0    Level of Consciousness: Alert, Oriented, and Cooperative = 0    Level of Activity: Walking unassisted = 0    Respiratory Pattern: Regular Pattern; RR 8-20 = 0    Breath Sounds: Clear = 0    Cough: Strong, spontaneous, non-productive = 0    SPO2 (COPD values may differ): 90-91% on room air or greater than 92% on FiO2 24- 28% = 1    Peak Flow (asthma only): not applicable = 0    RSI: 0-5 = See once and convert to home regimen or discontinue  PLAN    Goals: volume expansion and improve oxygenation    Patient educated on: Oxygenation and Deep Breath and Cough     Comment / Outcomes: IMPROVE VENTILATION    Plan of Care: MDI ALBUTEROL Q6 PRN    Is patient being placed on Home Treatment Regimen? No      Respiratory Protocol Guidelines    1. Assessment and treatment by Respiratory Therapy will be initiated for medication and therapeutic interventions upon initiation of aerosolized medication.  2. Physician will be contacted for respiratory rate (RR) greater than 35 breaths per minute. Therapy will be held for heart rate (HR) greater than 140 beats per minute,  pending direction from physician.  3. Bronchodilators will be administered via Metered Dose Inhaler (MDI) or Hydrofluoroalkane (HFA) with spacer when the following criteria are met:  a. Alert and cooperative     b. HR < 140 bpm  c. RR < 30 bpm                d. Can demonstrate a 2???3 second inspiratory hold  4. Bronchodilators will be administered via Hand Held Nebulizer [facility]) to patients when ANY of the following criteria are met  a. Incogizant or uncooperative          b. Patients treated with HHN at Home        c. Unable to demonstrate proper MDI or HFA technique     d. RR > 30 bpm   5. Bronchodilators will be delivered via Metered Dose Inhaler (MDI) or Hydrofluoroalkane (HFA) with spacer to intubated patients on mechanical ventilation.  6. Inhalation medication orders will be delivered and/or substituted as outlined below.    Aerosolized Medications Ordering and Administration Guidelines:    1. All Medications will be ordered by a physician, and their frequency and/or modality will be adjusted  as defined by the patients Respiratory Severity Index (RSI) score.  2. If the patient does not have documented COPD, consider discontinuing anticholinergics when RSI is less than 9.  3. If the bronchospasm worsens (increased RSI), then the bronchodilator frequency can be increased to a maximum of every 4 hours.  If greater than every 4 hours is required, the physician will be contacted.  4. If the bronchospasm improves, the frequency of the bronchodilator can be decreased, based on the patient's RSI, but not less than home treatment regimen frequency.  5. Bronchodilator(s) will be discontinued if patient has a RSI less than 9 and has received no scheduled or as needed treatment for 72  Hrs.    Patients Ordered on a Mucolytic Agent:    1. Must always be administered with a bronchodilator.    2. Discontinue if patient experiences worsened bronchospasm, or secretions have lessened to the point that the patient is able to clear  them with a cough.    Anti-inflammatory and Combination Medications:    1. If the patient lacks prior history of lung disease, is not using inhaled anti-inflammatory medication at home, and lacks wheezing by examination or by history for at least 24 hours, contact physician for possible discontinuation.      Pharmacy formulary interchanges for Respiratory medications can also be found by using the Smart Phrase .[email or web address] in any EPIC comment field.

## 2015-09-18 NOTE — Progress Notes (Signed)
Stress test portion of testing completed. Awaiting second set of nuclear images at this time. See epic cardiology for results.

## 2015-09-18 NOTE — Telephone Encounter (Signed)
noted 

## 2015-09-18 NOTE — Progress Notes (Signed)
Returned from stress testing. Requesting pain medication. States had sharp shooting pain during stress test. States was unable to complete testing on the treadmill. Stress test ing reported that the images are to be completed tomorrow using a 2 day protocol.

## 2015-09-18 NOTE — Telephone Encounter (Signed)
Pt was rounded on this morning by RK  RK was planning on discharging pt after stress test    Horris Latino wanted to call with an update  Pt stress test was split in two parts  He second test isn't until tomorrow    FYI

## 2015-09-18 NOTE — H&P (Addendum)
Department of Family Practice  Admission History and Physical      Rachael Mays is a 59 y.o. year old female patient of Dr. Murrell Converse, who presents to the ED with chest pain. Pt states woke up yesterday AM not feeing well. Was fixing coffee and had onset of chest tightness and sob. Tried drinking coffee as sometimes will help when gets this but didn't improve so came to ED. STates continues to have a tightness in chest and feeling like can not take a deep breath. She states does not get worse with exertion or deep breathing. Has had a dry cough but nothing dif from her usual. No ST, no RN, no ear pain. + palpitations off and on but this is also normal for her and no worse than normal.     SHe has had chest pain off and on, has not had stress test yet. Rest of workup has been negative.  Sounds like has been referred to pain mgmt. Has sleep doctor eval for OSA scheduled in December.    REVIEW OF SYSTEMS:    Constitutional:  Not Feeling well, No fever, chills, no weight change, no fatigue  Eyes:  no vision loss/disturbance  Ears, nose, mouth, throat, and face:    No hearing change, no sore throat, no rhinorrhea, no nasal congestion  Respiratory:   + chronic dry cough, +shortness of breath, no dyspnea on exertion,   Cardiovascular:   + chest pain, + chronic intermittent palpitations, no irregular heart beat, no edema  Gastrointestinal:   No change in bowels, no pain, no nausea or vomiting, no blood or black tarry stool  Genitourinary:   No change in bladder, no nocturia  Hematologic/lymphatic:   No bleeding, no easy bruising  Musculoskeletal:    No joint pain  Behavioral/Psych:    No depression, no anxiety  Skin:    No rashes, no new lesions      Patient Active Problem List   Diagnosis   ??? Back pain   ??? Deep vein thrombosis ([place])   ??? Depression   ??? Hyperlipidemia   ??? Pulmonary embolism ([place])   ??? Nausea & vomiting   ??? Abdominal  pain, other specified site   ??? Morbid obesity ([place])   ??? Status post gastric banding   ???  Vertigo   ??? Dizziness   ??? Tinnitus, subjective   ??? Complication of gastric banding   ??? HTN (hypertension)   ??? Hyperglycemia   ??? Chest pain   ??? 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 Adrenal mass, left ([place])   ??? Adrenal nodule ([place])   ??? OSA (obstructive sleep apnea)       Past Medical History   Diagnosis Date   ??? Anxiety    ??? Asthma    ??? Chronic back pain    ??? Deep vein thrombosis    ??? Depression    ??? GERD (gastroesophageal reflux disease)      NO LONGER SINCE LAP   ??? GERD (gastroesophageal reflux disease) 01/23/2009   ??? Hyperlipidemia      hx; resolved with lap band   ??? Hypertension    ??? Obesity      hx of; had lap band   ??? Pulmonary embolism [facility])      Past Surgical History   Procedure Laterality Date   ??? Colonoscopy     ??? Cesarean section     ??? Cholecystectomy     ??? Hysterectomy     ???  Shoulder surgery     ??? Lap band  05/01/08     Dr. Nicole Cella   ??? Back surgery       neck  plates   ??? Colonoscopy  04/08/10   ??? Other surgical history       [place] Filter: curently in place   ??? Upper gastrointestinal endoscopy  05/19/13   ??? Other surgical history  07/05/13     lap band removal   ??? Upper gastrointestinal endoscopy  07/21/13     ESOPHAGEAL STENT PLACEMENT   ??? Partial hysterectomy     ??? Upper gastrointestinal endoscopy N/A 07/31/2014     Esophagogastroduodenoscopy with esophageal balloon dilation   ??? Vascular surgery  04/2015     Lucendia Herrlich, celiac artery angiogram, normal abd arteries     Family History   Problem Relation Age of Onset   ??? Arthritis Mother    ??? Cancer Mother    ??? Depression Mother    ??? High Blood Pressure Mother    ??? Heart Disease Mother 60     MI    ??? Ovarian Cancer Mother 43   ??? Cancer Father 42     colon   ??? Heart Disease Maternal Grandmother    ??? Early Death Paternal Grandmother    ??? Heart Disease Paternal Grandfather    ??? Diabetes Other    ??? High Blood Pressure Other    ??? Obesity Other        Allergies   Allergen Reactions   ??? Morphine Itching   ??? Talwin [Pentazocine] Other (See Comments)     dizzy     Outpatient  Prescriptions Marked as Taking for the 09/18/15 encounter [facility] Encounter) with Darrick Penna, MD   Medication Sig Dispense Refill   ??? naproxen (NAPROSYN) 500 MG tablet Take 1 tablet by mouth 2 times daily (with meals) 30 tablet 0   ??? cyclobenzaprine (FLEXERIL) 10 MG tablet Take 1 tablet by mouth 3 times daily as needed for Muscle spasms 30 tablet 0   ??? traMADol (ULTRAM) 50 MG tablet Take 1 tablet by mouth every 6 hours as needed for Pain 20 tablet 0   ??? metoprolol (LOPRESSOR) 100 MG tablet Take 1.5 tablets by mouth 2 times daily 90 tablet 3   ??? metoclopramide (REGLAN) 5 MG tablet TAKE ONE TABLET BY MOUTH FOUR TIMES A DAY BEFORE MEALS AND AT BEDTIME 120 tablet 0   ??? montelukast (SINGULAIR) 10 MG tablet TAKE 1 TABLET BY MOUTH DAILY 90 tablet 1   ??? hydrochlorothiazide (HYDRODIURIL) 25 MG tablet Take 1 tablet by mouth daily 90 tablet 0   ??? lisinopril (PRINIVIL;ZESTRIL) 40 MG tablet Take 40 mg by mouth daily     ??? traZODone (DESYREL) 100 MG tablet Take 1 tablet by mouth nightly 90 tablet 1   ??? ALPRAZolam (XANAX) 0.5 MG tablet Take 1 tablet by mouth 2 times daily as needed for Anxiety 60 tablet 0   ??? venlafaxine (EFFEXOR XR) 150 MG XR capsule Take 1 capsule by mouth daily 90 capsule 3   ??? nystatin 100000 UNIT/GM POWD APPLY 3 TIMES DAILY 30 g 4   ??? fluocinonide (LIDEX) 0.05 % cream Apply topically 2 times daily. 1 Tube 1   ??? metaxalone (SKELAXIN) 800 MG tablet TAKE ONE TABLET BY MOUTH THREE TIMES A DAY 30 tablet 1   ??? Multiple Vitamins-Minerals (MULTIVITAMIN PO) Take by mouth     ??? CALCIUM-VITAMIN D PO Take by mouth     ???  albuterol (PROVENTIL HFA) 108 (90 BASE) MCG/ACT inhaler Inhale 2 puffs into the lungs every 6 hours as needed for Wheezing 3 Inhaler 3   ??? omeprazole (PRILOSEC) 40 MG capsule Take 1 capsule by mouth daily. 30 capsule 3   ??? cetirizine (ZYRTEC ALLERGY) 10 MG tablet Take 10 mg by mouth daily.         Social History     Social History   ??? Marital status: Married     Spouse name: N/A   ??? Number of  children: N/A   ??? Years of education: N/A     Occupational History   ??? Not on file.     Social History Main Topics   ??? Smoking status: Former Smoker     Packs/day: 0.00     Years: 35.00     Types: Cigarettes     Quit date: 08/07/2012   ??? Smokeless tobacco: Never Used   ??? Alcohol use No   ??? Drug use: No   ??? Sexual activity: Not on file     Other Topics Concern   ??? Not on file     Social History Narrative    ** Merged History Encounter **                  PHYSICAL EXAM  SpO2: 96 %  O2 Flow Rate (L/min): 2 L/min    Patient Vitals for the past 24 hrs:   BP Temp Temp src Pulse Resp SpO2 Height Weight   09/18/15 0815 146/75 98 ??F (36.7 ??C) Temporal 60 16 - - -   09/18/15 0730 - - - - - - - (!) 304 lb 9.6 oz (138.2 kg)   09/18/15 0623 137/82 - - 60 - - - -   09/18/15 0315 - - - - - 96 % - -   09/18/15 0215 137/69 98.8 ??F (37.1 ??C) Oral 62 16 91 % - (!) 305 lb 5.4 oz (138.5 kg)   09/18/15 0146 142/68 - - 65 14 95 % - -   09/18/15 0132 139/74 - - 63 16 92 % - -   09/18/15 0117 135/71 - - 63 15 94 % - -   09/18/15 0102 136/63 - - 61 12 96 % - -   09/18/15 0047 135/63 - - 61 18 93 % - -   09/18/15 0032 137/67 - - 63 16 94 % - -   09/18/15 0017 131/69 - - 64 14 95 % - -   09/18/15 0002 129/63 - - 63 16 94 % - -   09/17/15 2347 127/66 - - 63 23 93 % - -   09/17/15 2332 132/72 - - 67 14 97 % - -   09/17/15 2320 136/72 - - 72 26 93 % - -   09/17/15 2302 132/61 - - 63 15 94 % - -   09/17/15 2252 126/52 - - 62 13 95 % - -   09/17/15 1933 161/80 97.9 ??F (36.6 ??C) - 60 18 96 % 5\' 6"  (1.676 m) 290 lb (131.5 kg)       CONSTITUTIONAL:  Alert and oriented x 4 NAD, well nourished, well hydrated, well developed.obese    Eyes:  Lids and lashes normal, pupils equal, round and reactive to light, extra ocular muscles intact, sclera clear, conjunctiva normal    Head/ENT:  Normocephalic, without obvious abnormality, atramatic, external ears without lesions, oral pharynx with moist mucus membranes, tonsils without erythema or exudates  Neck:   Supple, symmetrical, trachea midline, no adenopathy, thyroid symmetric, not enlarged and no tenderness, skin normal, No carotid bruit    Heart: Regular rate and rhythm, normal S1 and S2, and no murmur noted, chest wall NT to palpation    Lungs:  No increased work of breathing, good air exchange, clear to auscultation bilaterally    Abdomen:  Normal bowel sounds, soft, non-distended, non-tender, no masses palpated, no hepatosplenomegally    Extremities:  No loss of hair, trace edema, nl pedal pulses bilaterally, no skin lesions    NEUROLOGIC:Cranial nerves II-XII are grossly intact.  Motor is 5 out of 5 bilaterally.      EKG: normal EKG, normal sinus rhythm.    CBC:   Lab Results   Component Value Date    WBC 8.0 09/17/2015    RBC 4.70 09/17/2015    HGB 13.4 09/17/2015    HCT 40.9 09/17/2015    MCV 87.0 09/17/2015    MCH 28.4 09/17/2015    MCHC 32.6 09/17/2015    RDW 14.7 09/17/2015    PLT 155 09/17/2015    MPV 9.4 09/17/2015     CMP:    Lab Results   Component Value Date    NA 141 09/17/2015    K 4.1 09/17/2015    CL 100 09/17/2015    CO2 29 09/17/2015    BUN 15 09/17/2015    CREATININE 0.9 09/17/2015    CREATININE 1.2 05/04/2014    GFRAA >60 09/17/2015    GFRAA >60 03/16/2012    AGRATIO 1.0 09/17/2015    LABGLOM >60 09/17/2015    GLUCOSE 103 09/17/2015    PROT 7.3 09/17/2015    PROT 7.3 03/31/2011    LABALBU 3.7 09/17/2015    CALCIUM 9.4 09/17/2015    BILITOT 0.4 09/17/2015    ALKPHOS 88 09/17/2015    AST 68 09/17/2015    ALT 86 09/17/2015     Last 3 Troponin:    Lab Results   Component Value Date    TROPONINI <0.01 09/18/2015    TROPONINI <0.01 09/17/2015    TROPONINI <0.01 09/17/2015    TROPONINI 0.00 05/04/2014    TROPONINI <0.01 05/15/2011    TROPONINI 0.01 05/15/2011     BNP 349    Assessment and Plan:    Principal Problem:    Chest pain  Active Problems:    Hyperlipidemia    HTN (hypertension)    OSA (obstructive sleep apnea)      Chest pain does not seem cardiac but given recurrent nature and risk factors  will get stress test.  Lungs sound fine and CT neg for any pathology  Lab work up negative  Has hx of gastric surgeries with complications, wonder if that may be contributing to her chest sx  sats dropped some overnight, likely has OSA, already set up for eval  bp stable, cont home meds  Home later today if stress test neg.  Trial prednisone for pain

## 2015-09-18 NOTE — Progress Notes (Signed)
Dr Molli Hazard office called , wanted to make them aware that stress test is to be completed tomorrow -

## 2015-09-18 NOTE — Plan of Care (Signed)
Problem: Pain:  Goal: Pain level will decrease  Pain level will decrease   Assessment complete. Continues to have difficulty taking a deep breath. Medicated at this time. Visiting with family and in good spirits. Aware of second part of stress in am. No new problems.

## 2015-09-18 NOTE — Plan of Care (Addendum)
Problem: Pain:  Goal: Pain level will decrease  Pain level will decrease   Outcome: Ongoing      C/O of Left sided chest pain which radiates to bilateral flank areas, says pain increases when trying to take deep breaths.  Gave Dilaudid per prn order.jmitchell

## 2015-09-18 NOTE — Progress Notes (Signed)
Pt educated on stress test protocol, v/u, all questions answered at this time

## 2015-09-18 NOTE — ED Notes (Signed)
Report called to floor. Pt ready to go upstairs on a portable monitor.     Azalia Bilis, RN  09/18/15 [phone]

## 2015-09-18 NOTE — Other (Addendum)
Patient Acct Nbr:  [id number]  Primary AUTH/CERT:  [place] Name:   HEALTHSPAN/PREFERRED PPO/UMR/BUTL  Primary Insurance Plan Name:  HEALTHSPAN - MHP/CHP [place]  Primary Insurance Group Number:  [id number]  Primary Insurance Plan Type: N  Primary Insurance Policy Number:  [id number]

## 2015-09-18 NOTE — Progress Notes (Signed)
Pt admitted from ED, arrived on floor at around Rachael Mays on stretcher.  Alert and oriented x4, spouse accompanied pt to room.  Plan of care reviewed with pt, questions answered, VU.  Oriented to room, call light in reach.  VSS.  Assessment completed, see flow charts.jmitchell

## 2015-09-18 NOTE — Progress Notes (Signed)
Pt NPO for stress testing this AM. Test explained and process reviewed. Questions answered

## 2015-09-18 NOTE — Progress Notes (Signed)
Pt unable to reach THR with Bruce protocol. Pt refusing advancing to next stage of protocol r/t SOB.  Test changed to Lexiscan protocol.

## 2015-09-18 NOTE — Progress Notes (Signed)
To satress test per wheeelchair

## 2015-09-18 NOTE — Progress Notes (Signed)
Stress lab called. Plan to do 2 part stress test, will call for patient around 2pm. OK for patient to have light breakfast now. No caffeine. Patient informed of Plan of Care.

## 2015-09-19 MED ORDER — TECHNETIUM TC 99M TETROFOSMIN IV KIT
Freq: Once | INTRAVENOUS | Status: AC | PRN
Start: 2015-09-19 — End: 2015-09-19
  Administered 2015-09-19: 15:00:00 30 via INTRAVENOUS

## 2015-09-19 MED ORDER — NORMAL SALINE FLUSH 0.9 % IV SOLN
0.9 % | INTRAVENOUS | Status: AC
Start: 2015-09-19 — End: ?

## 2015-09-19 MED ORDER — NORMAL SALINE FLUSH 0.9 % IV SOLN
0.9 % | INTRAVENOUS | Status: DC | PRN
Start: 2015-09-19 — End: 2015-09-21
  Administered 2015-09-20 (×3): 10 mL via INTRAVENOUS

## 2015-09-19 MED ORDER — NORMAL SALINE FLUSH 0.9 % IV SOLN
0.9 % | Freq: Two times a day (BID) | INTRAVENOUS | Status: DC
Start: 2015-09-19 — End: 2015-09-21
  Administered 2015-09-20 – 2015-09-21 (×3): 10 mL via INTRAVENOUS

## 2015-09-19 MED ORDER — DEXTROSE 5 % IV SOLN
5 % | INTRAVENOUS | Status: DC
Start: 2015-09-19 — End: 2015-09-21

## 2015-09-19 MED FILL — NORMAL SALINE FLUSH 0.9 % IV SOLN: 0.9 % | INTRAVENOUS | Qty: 10

## 2015-09-19 MED FILL — LOVENOX 40 MG/0.4ML SC SOLN: 40 MG/0.4ML | SUBCUTANEOUS | Qty: 0.4

## 2015-09-19 MED FILL — HYDROMORPHONE HCL 1 MG/ML IJ SOLN: 1 MG/ML | INTRAMUSCULAR | Qty: 0.5

## 2015-09-19 MED FILL — PANTOPRAZOLE SODIUM 40 MG PO TBEC: 40 MG | ORAL | Qty: 1

## 2015-09-19 MED FILL — METOCLOPRAMIDE HCL 10 MG PO TABS: 10 MG | ORAL | Qty: 1

## 2015-09-19 MED FILL — CETIRIZINE HCL 10 MG PO TABS: 10 MG | ORAL | Qty: 1

## 2015-09-19 MED FILL — HYDROCHLOROTHIAZIDE 25 MG PO TABS: 25 MG | ORAL | Qty: 1

## 2015-09-19 MED FILL — ALPRAZOLAM 0.5 MG PO TABS: 0.5 MG | ORAL | Qty: 1

## 2015-09-19 MED FILL — METOPROLOL TARTRATE 50 MG PO TABS: 50 MG | ORAL | Qty: 3

## 2015-09-19 MED FILL — SINGULAIR 10 MG PO TABS: 10 MG | ORAL | Qty: 1

## 2015-09-19 MED FILL — LISINOPRIL 20 MG PO TABS: 20 MG | ORAL | Qty: 2

## 2015-09-19 MED FILL — PREDNISONE 20 MG PO TABS: 20 MG | ORAL | Qty: 2

## 2015-09-19 MED FILL — EFFEXOR XR 150 MG PO CP24: 150 MG | ORAL | Qty: 1

## 2015-09-19 NOTE — Consults (Signed)
[place]  [phone]        Reason for Consultation/Chief Complaint: "I have been having shortness of breath and chest pain."    History of Present Illness:  Rachael Mays is a 59 y.o. patient who presented to the hospital with complaints of knife like sharp pain but also a dull component left chest and both lower ribs. +Nausea.  No diaphoresis. + Shortness of breath.   She describes approximately a two-week history of progressive shortness of breath and cough and the chest discomfort is been the past 3-4 days.  She has had intermittent episodes of coughing.  She has a history of lap band in July 2014.  Stress test was performed revealing an abnormality.    Past Medical History:   has a past medical history of Anxiety; Asthma; Chronic back pain; Deep vein thrombosis; Depression; GERD (gastroesophageal reflux disease); GERD (gastroesophageal reflux disease); Hyperlipidemia; Hypertension; Obesity; and Pulmonary embolism ([place]).    Surgical History:   has a past surgical history that includes Colonoscopy; Cesarean section; Cholecystectomy; Hysterectomy; shoulder surgery; Lap Band (05/01/08); back surgery; Colonoscopy (04/08/10); other surgical history; Upper gastrointestinal endoscopy (05/19/13); other surgical history (07/05/13); Upper gastrointestinal endoscopy (07/21/13); partial hysterectomy; Upper gastrointestinal endoscopy (N/A, 07/31/2014); and vascular surgery (04/2015).     Social History:   reports that she quit smoking about 3 years ago. Her smoking use included Cigarettes. She smoked 0.00 packs per day for 35.00 years. She has never used smokeless tobacco. She reports that she does not drink alcohol or use illicit drugs.     Family History:  family history includes Arthritis in her mother; Cancer in her mother; Cancer (age of onset: 46) in her father; Depression in her mother; Diabetes in an other family member; Early Death in her paternal grandmother; Heart Disease in her maternal  grandmother and paternal grandfather; Heart Disease (age of onset: 48) in her mother; High Blood Pressure in her mother and another family member; Obesity in an other family member; Ovarian Cancer (age of onset: 46) in her mother.    Home Medications:  Were reviewed and are listed in nursing record. and/or listed below  Prior to Admission medications    Medication Sig Start Date End Date Taking? Authorizing Provider   predniSONE (DELTASONE) 20 MG tablet Take 2 tablets by mouth daily for 5 days 09/18/15 09/23/15 Yes Darrick Penna, MD   cyclobenzaprine (FLEXERIL) 10 MG tablet Take 1 tablet by mouth 3 times daily as needed for Muscle spasms 09/13/15 09/23/15 Yes Rocco Serene, MD   traMADol Veatrice [place]bon) 50 MG tablet Take 1 tablet by mouth every 6 hours as needed for Pain 09/13/15 09/23/15 Yes Rocco Serene, MD   metoprolol (LOPRESSOR) 100 MG tablet Take 1.5 tablets by mouth 2 times daily 09/10/15  Yes Harvel Quale, MD   metoclopramide (REGLAN) 5 MG tablet TAKE ONE TABLET BY MOUTH FOUR TIMES A DAY BEFORE MEALS AND AT BEDTIME 08/06/15  Yes Harvel Quale, MD   montelukast (SINGULAIR) 10 MG tablet TAKE 1 TABLET BY MOUTH DAILY 07/31/15  Yes Jamal Collin, MD   hydrochlorothiazide (HYDRODIURIL) 25 MG tablet Take 1 tablet by mouth daily 07/16/15  Yes Harvel Quale, MD   lisinopril (PRINIVIL;ZESTRIL) 40 MG tablet Take 40 mg by mouth daily   Yes Historical Provider, MD   traZODone (DESYREL) 100 MG tablet Take 1 tablet by mouth nightly 07/04/15  Yes Harvel Quale, MD  ALPRAZolam (XANAX) 0.5 MG tablet Take 1 tablet by mouth 2 times daily as needed for Anxiety 06/03/15  Yes Lucky Rathke Rayford Halsted, MD   venlafaxine (EFFEXOR XR) 150 MG XR capsule Take 1 capsule by mouth daily 06/03/15  Yes Harvel Quale, MD   nystatin 100000 UNIT/GM POWD APPLY 3 TIMES DAILY 04/08/15  Yes Harvel Quale, MD   fluocinonide (LIDEX) 0.05 % cream Apply topically 2 times daily. 02/11/15  Yes Harvel Quale, MD   Multiple Vitamins-Minerals (MULTIVITAMIN PO) Take by mouth   Yes Historical Provider, MD   CALCIUM-VITAMIN D PO Take by mouth   Yes Historical Provider, MD   albuterol (PROVENTIL HFA) 108 (90 BASE) MCG/ACT inhaler Inhale 2 puffs into the lungs every 6 hours as needed for Wheezing 05/17/14  Yes Harvel Quale, MD   omeprazole (PRILOSEC) 40 MG capsule Take 1 capsule by mouth daily. 07/21/13  Yes Norwood Levo, MD   cetirizine (ZYRTEC ALLERGY) 10 MG tablet Take 10 mg by mouth daily.   Yes Historical Provider, MD   ondansetron (ZOFRAN) 4 MG tablet Take 1 tablet by mouth daily as needed for Nausea or Vomiting 06/03/15   Harvel Quale, MD        Allergies:  Morphine and Talwin [pentazocine]     Review of Systems:   A 10 point review of symptoms is negative except as noted in history of present illness.    Physical Examination:    Vitals:    09/19/15 1230   BP: 120/60   Pulse: 62   Resp: 16   Temp: 98 ??F (36.7 ??C)   SpO2:     Weight: (!) 309 lb 6.4 oz (140.3 kg)         General Appearance:  Alert, cooperative, no distress, appears stated age   Head:  Normocephalic, without obvious abnormality, atraumatic   Eyes:  EOMI, conjunctiva/corneas clear       Nose: Nares normal   Throat: Lips normal   Neck: Supple, symmetrical, trachea midline,  no carotid bruit or JVD       Lungs:   Clear to auscultation bilaterally, respirations unlabored   Chest Wall:  No tenderness or deformity   Heart:  Regular rate and rhythm, S1, S2 normal, no murmur, rub or gallop   Abdomen:   Soft, non-tender, bowel sounds active all four quadrants,  no masses, no organomegaly           Extremities: Extremities normal, atraumatic, no cyanosis or edema   Pulses: 2+ and symmetric   Skin: Skin color, texture, turgor normal, no rashes or lesions   Pysch: Normal mood and affect   Neurologic: Normal gross motor and sensory exam.         Labs  CBC:   Lab Results   Component Value Date    WBC 8.0 09/17/2015    RBC 4.70  09/17/2015    HGB 13.4 09/17/2015    HCT 40.9 09/17/2015    MCV 87.0 09/17/2015    RDW 14.7 09/17/2015    PLT 155 09/17/2015     CMP:    Lab Results   Component Value Date    NA 141 09/17/2015    K 4.1 09/17/2015    CL 100 09/17/2015    CO2 29 09/17/2015    BUN 15 09/17/2015    CREATININE 0.9 09/17/2015    CREATININE 1.2 05/04/2014    GFRAA >60 09/17/2015  GFRAA >60 03/16/2012    AGRATIO 1.0 09/17/2015    LABGLOM >60 09/17/2015    GLUCOSE 103 09/17/2015    PROT 7.3 09/17/2015    PROT 7.3 03/31/2011    CALCIUM 9.4 09/17/2015    BILITOT 0.4 09/17/2015    ALKPHOS 88 09/17/2015    AST 68 09/17/2015    ALT 86 09/17/2015     PT/INR:  No results found for: [facility]  Lab Results   Component Value Date    CKTOTAL 189 01/11/2015    TROPONINI <0.01 09/18/2015       EKG:  I have reviewed EKG with the following interpretation:  Impression:  17-Sep-2015 19:32:37 [facility]  Normal sinus rhythm  Poor R wave progression  Abnormal ECG    Myoview stress test 09/19/15:  Summary   ??-Moderate sized anterior mostly reversible defect consistent with ischemia   ??in the territory of the mid LAD .   ??-Normal LV function.   ??-Study is limited by breast attenuation.     CTPA 09/17/15:  No evidence of pulmonary embolism or acute pulmonary abnormality.    Cardiac cath 06/03/12:  Normal coronaries and LVEF  Plan:  1. Continue Holter monitor     Myoview stress test 05/15/11:  IMPRESSION-    ??   Normal myocardial perfusion scan.     Myoview stress test 10/23/09:  IMPRESSION- ??Normal pharmacological Myoview with tomography. ?? ?? ??   ??       Assessment  Patient Active Problem List   Diagnosis   ??? Back pain   ??? Deep vein thrombosis ([place])   ??? Depression   ??? Hyperlipidemia   ??? Pulmonary embolism ([place])   ??? Nausea & vomiting   ??? Abdominal  pain, other specified site   ??? Morbid obesity ([place])   ??? Status post gastric banding   ??? Vertigo   ??? Dizziness   ??? Tinnitus, subjective   ??? Other complications of gastric band procedure   ??? Essential hypertension   ???  Hyperglycemia   ??? Chest pain   ??? 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 Adrenal mass, left ([place])   ??? Adrenal nodule ([place])   ??? OSA (obstructive sleep apnea)   ??? Dyspnea and respiratory abnormalities   ??? Abnormal stress test         Plan:    I had the opportunity to review the clinical symptoms and presentation of MARTIE MUHLBAUER.  She presents with progressive shortness of breath and more recently chest discomfort.  Stress test performed and is abnormal which represents a change from her prior stress test.  She has a history of a normal cardiac cath in 2013.  Previous stress tests have been normal.    I recommend that the patient undergo cardiac catheterization to define coronary anatomy.  Her complaints of shortness of breath seen atypical for symptomatic CAD but I think it'll be difficult to not repeat cardiac catheterization.    Thank you for allowing [place] to participate in the care of MYRTLE HALLER. Further evaluation will be based upon the patient's clinical course and testing results.    All questions and concerns were addressed to the patient/family. Alternatives to my treatment were discussed. The note was completed using EMR. Every effort was made to ensure accuracy; however, inadvertent computerized transcription errors may be present.

## 2015-09-19 NOTE — Care Coordination-Inpatient (Signed)
[place] Inpatient Visit: Care Transition     Patient Interviewed: Carlisle Beers  Contact/Support Person:    Living Situation:   Living arrangements: with family:  spouse and daughter                   Home: 1-story house/ trailer   Social support:a good social support network   Any concerns about safety in home? (stairs, rugs or Trip Hazards?) none    Readmission Risk:  Has the following: 10+ medications (frequent admissions, frequent ED visits, Homeless, lack of support, drives, age >34, history of falls, compliance issues, balance issues, 10+ medications, uninsured, under insured)     Chronic Illness: anxiety, asthma, chronic back pain, deep vein thrombosis, depression, GERD, hyperlipidemia, HTN, Obesity, pulmonary embolism      Self-Care, Fall Risk & DME:   Any previous falls or injuries in the home?  No   Visual Impairement:  Glasses   Difficulty hearing: no (hard of hearing, Deaf, Wears hearing aids)   Patients memory & judgement adequate to safely complete daily activities? Yes   Able to express needs/desires? Yes              Any difficulty with activities of daily living? No  Walks in Home: Independent   DME/Home Equipment: none    Financial:   Afford medications? Yes   Government assistance?No   Who prepares meals & grocery shops? self   Adequate housing? Yes   Connected with the following services? none    Mode of transportation? car     Health Literacy:   Primary Language: English   Does anyone help with medications at home?  No  Anything preventing from taking medications at home?  No     Summary:  59 year old female in with difficulty breathing & pain.       Follow up appointments:    Future Appointments  Date Time Provider [place]   09/23/2015 1:50 PM Harvel Quale, MD [facility]   11/11/2015 2:15 PM Tobin Chad, MD [facility]   03/23/2016 11:00 AM Francisca December, MD [facility]       Review Due Health Maintenance:  Health Maintenance Due   Topic Date Due   ??? Hepatitis C  screen  07/01/1958   ??? HIV screen  07/02/1973   ??? Cervical cancer screen  07/03/1979

## 2015-09-19 NOTE — Plan of Care (Signed)
Problem: Pain:  Goal: Pain level will decrease  Pain level will decrease   Pain level reduced to level 5 after first dose of dilaudid. Medicated again for same c/o pain. Xanax given for rest. Is sleeping quietly at this time.

## 2015-09-19 NOTE — Progress Notes (Signed)
Department of Family Practice  Adult Daily Progress Note      SUBJECTIVE      Feeling ok, still cp but meds help. Still feels like hard to take a deep breath.    OBJECTIVE    Physical  SpO2: 95 %  O2 Flow Rate (L/min): 2 L/min    Patient Vitals for the past 24 hrs:   BP Temp Temp src Pulse Resp SpO2 Weight   09/19/15 0845 142/78 97.6 ??F (36.4 ??C) Temporal 60 16 - -   09/19/15 0650 - - - - - - (!) 309 lb 6.4 oz (140.3 kg)   09/18/15 2342 147/84 96.4 ??F (35.8 ??C) Temporal 60 18 95 % -   09/18/15 1957 144/80 96.6 ??F (35.9 ??C) Temporal 69 16 - -   09/18/15 1502 133/79 98.2 ??F (36.8 ??C) Temporal 65 18 - -   09/18/15 1300 152/81 98 ??F (36.7 ??C) Temporal 76 16 - -     Alert, oriented x 4 NAD, obese  Lung clear  CV rrr  No edema    Data      ASSESSMENT AND PLAN:    Principal Problem:    Chest pain  Active Problems:    Hyperlipidemia    Other complications of gastric band procedure    HTN (hypertension)    OSA (obstructive sleep apnea)    Dyspnea and respiratory abnormalities    Finish stress test today  Home later today if ok  Consider outpt PFT for her sob sx

## 2015-09-19 NOTE — Progress Notes (Signed)
Abnormal stress test result received. Call placed to Dr Leandro Reasoner to report result.

## 2015-09-19 NOTE — Care Coordination-Inpatient (Signed)
Patient plans to return home with her daughter and denied any needs.

## 2015-09-19 NOTE — Plan of Care (Signed)
Problem: Pain:  Goal: Pain level will decrease  Pain level will decrease   Assessment complete. Continues to c/o the same pain. Will medicate at 2130. Also c/o constipation. MOM and prune juice given. Up to the shower with her daughter's help. Tolerated well.

## 2015-09-19 NOTE — Telephone Encounter (Signed)
Please call Horris Latino at the stress lab regarding patient's abnormal stress test results.  Needs a call back ASAP. Will need consult orders and things.  Her number is [phone].

## 2015-09-20 ENCOUNTER — Encounter: Attending: Cardiovascular Disease | Primary: Student in an Organized Health Care Education/Training Program

## 2015-09-20 LAB — CBC
Hematocrit: 39.7 % (ref 36.0–48.0)
Hemoglobin: 13 g/dL (ref 12.0–16.0)
MCH: 28.5 pg (ref 26.0–34.0)
MCHC: 32.8 g/dL (ref 31.0–36.0)
MCV: 87.1 fL (ref 80.0–100.0)
MPV: 9.5 fL (ref 5.0–10.5)
Platelets: 158 10*3/uL (ref 135–450)
RBC: 4.56 M/uL (ref 4.00–5.20)
RDW: 14.7 % (ref 12.4–15.4)
WBC: 11.1 10*3/uL — ABNORMAL HIGH (ref 4.0–11.0)

## 2015-09-20 LAB — PROTIME-INR
INR: 1.03 (ref 0.85–1.16)
Protime: 11.8 s (ref 9.8–13.0)

## 2015-09-20 MED ORDER — MIDAZOLAM HCL 2 MG/2ML IJ SOLN
2 MG/ML | INTRAMUSCULAR | Status: AC
Start: 2015-09-20 — End: ?

## 2015-09-20 MED ORDER — FENTANYL CITRATE (PF) 100 MCG/2ML IJ SOLN
100 MCG/2ML | INTRAMUSCULAR | Status: AC
Start: 2015-09-20 — End: ?

## 2015-09-20 MED ORDER — ACETAMINOPHEN 325 MG PO TABS
325 MG | ORAL | Status: DC | PRN
Start: 2015-09-20 — End: 2015-09-21

## 2015-09-20 MED ORDER — NORMAL SALINE FLUSH 0.9 % IV SOLN
0.9 % | INTRAVENOUS | Status: DC | PRN
Start: 2015-09-20 — End: 2015-09-21
  Administered 2015-09-20: 22:00:00 10 mL via INTRAVENOUS

## 2015-09-20 MED ORDER — SODIUM CHLORIDE 0.9 % IV SOLN
0.9 % | INTRAVENOUS | Status: AC
Start: 2015-09-20 — End: 2015-09-20
  Administered 2015-09-20: 18:00:00 1000

## 2015-09-20 MED ORDER — LIDOCAINE HCL (PF) 1 % IJ SOLN
1 % | INTRAMUSCULAR | Status: AC
Start: 2015-09-20 — End: ?

## 2015-09-20 MED ORDER — NORMAL SALINE FLUSH 0.9 % IV SOLN
0.9 % | Freq: Two times a day (BID) | INTRAVENOUS | Status: DC
Start: 2015-09-20 — End: 2015-09-21
  Administered 2015-09-21 (×2): 10 mL via INTRAVENOUS

## 2015-09-20 MED ORDER — HEPARIN (PORCINE) IN NACL 2-0.9 UNIT/ML-% IJ SOLN
INTRAMUSCULAR | Status: AC
Start: 2015-09-20 — End: ?

## 2015-09-20 MED ORDER — ONDANSETRON HCL 4 MG/2ML IJ SOLN
4 MG/2ML | INTRAMUSCULAR | Status: AC
Start: 2015-09-20 — End: ?

## 2015-09-20 MED FILL — PREDNISONE 20 MG PO TABS: 20 MG | ORAL | Qty: 2

## 2015-09-20 MED FILL — TRAZODONE HCL 50 MG PO TABS: 50 MG | ORAL | Qty: 2

## 2015-09-20 MED FILL — EFFEXOR XR 150 MG PO CP24: 150 MG | ORAL | Qty: 1

## 2015-09-20 MED FILL — MILK OF MAGNESIA 7.75 % PO SUSP: 7.75 % | ORAL | Qty: 30

## 2015-09-20 MED FILL — PANTOPRAZOLE SODIUM 40 MG PO TBEC: 40 MG | ORAL | Qty: 1

## 2015-09-20 MED FILL — ONDANSETRON HCL 4 MG/2ML IJ SOLN: 4 MG/2ML | INTRAMUSCULAR | Qty: 2

## 2015-09-20 MED FILL — NORMAL SALINE FLUSH 0.9 % IV SOLN: 0.9 % | INTRAVENOUS | Qty: 10

## 2015-09-20 MED FILL — HYDROMORPHONE HCL 1 MG/ML IJ SOLN: 1 MG/ML | INTRAMUSCULAR | Qty: 0.5

## 2015-09-20 MED FILL — ALPRAZOLAM 0.5 MG PO TABS: 0.5 MG | ORAL | Qty: 1

## 2015-09-20 MED FILL — SODIUM CHLORIDE 0.9 % IV SOLN: 0.9 % | INTRAVENOUS | Qty: 1000

## 2015-09-20 MED FILL — MIDAZOLAM HCL 2 MG/2ML IJ SOLN: 2 MG/ML | INTRAMUSCULAR | Qty: 2

## 2015-09-20 MED FILL — LIDOCAINE HCL (PF) 1 % IJ SOLN: 1 % | INTRAMUSCULAR | Qty: 30

## 2015-09-20 MED FILL — FENTANYL CITRATE (PF) 100 MCG/2ML IJ SOLN: 100 MCG/2ML | INTRAMUSCULAR | Qty: 2

## 2015-09-20 MED FILL — METOPROLOL TARTRATE 50 MG PO TABS: 50 MG | ORAL | Qty: 3

## 2015-09-20 MED FILL — SINGULAIR 10 MG PO TABS: 10 MG | ORAL | Qty: 1

## 2015-09-20 MED FILL — LISINOPRIL 20 MG PO TABS: 20 MG | ORAL | Qty: 2

## 2015-09-20 MED FILL — METOCLOPRAMIDE HCL 10 MG PO TABS: 10 MG | ORAL | Qty: 1

## 2015-09-20 MED FILL — CETIRIZINE HCL 10 MG PO TABS: 10 MG | ORAL | Qty: 1

## 2015-09-20 MED FILL — DEXTROSE 5 % IV SOLN: 5 % | INTRAVENOUS | Qty: 1000

## 2015-09-20 MED FILL — HEPARIN (PORCINE) IN NACL 2-0.9 UNIT/ML-% IJ SOLN: INTRAMUSCULAR | Qty: 1000

## 2015-09-20 NOTE — Plan of Care (Signed)
Pre-op visit to bedside nurse completed, pt asleep.  Procedure was explained thoroughly and all questions answered.  Approximate time of procedure given and RN made aware of status.

## 2015-09-20 NOTE — Progress Notes (Signed)
Pt had light breakfast with anticipation of 1500 cardiac cath time today. Will continue to monitor.

## 2015-09-20 NOTE — Pre Sedation (Signed)
Brief Pre-Op Note/Sedation Assessment      Rachael Mays  [birth year]  [id number]  [id number]  5:06 PM    Planned Procedure: Cardiac Catheterization Procedure    Post Procedure Plan: Return to same level of care    Consent: I have discussed with the patient and/or the patient representative the indication, alternatives, and the possible risks and/or complications of the planned procedure and the anesthesia methods. The patient and/or patient representative appear to understand and agree to proceed.    Chief Complaint: Chest Pain/Pressure      Indications for the Procedure:   CAD Presentation:  Symptoms unlikely to be ischemic  Anginal Classification within 2 weeks:  CCS III - Symptoms with everyday living activities, i.e., moderate limitation  NYHA Heart Failure Class within 2 weeks: No symptoms      Anti- Anginal Meds within 2 weeks:   ANTI-ANGINAL MEDS: No      Stress or Imaging Studies Performed:  Stress Test with SPECT Result: Positive:  anterior Risk/Extent of Ischemia:  Intermediate    Vital Signs:  Visit Vitals   ??? BP 144/86   ??? Pulse (!) 46   ??? Temp 96.8 ??F (36 ??C) (Temporal)   ??? Resp 16   ??? Ht 5\' 6"  (1.676 m)   ??? Wt (!) 304 lb 11.2 oz (138.2 kg)   ??? SpO2 92%   ??? BMI 49.18 kg/m2       Allergies:  Allergies   Allergen Reactions   ??? Morphine Itching   ??? Talwin [Pentazocine] Other (See Comments)     dizzy       Past Medical History:  Past Medical History   Diagnosis Date   ??? Anxiety    ??? Asthma    ??? Chronic back pain    ??? Deep vein thrombosis    ??? Depression    ??? GERD (gastroesophageal reflux disease)      NO LONGER SINCE LAP   ??? GERD (gastroesophageal reflux disease) 01/23/2009   ??? Hyperlipidemia      hx; resolved with lap band   ??? Hypertension    ??? Obesity      hx of; had lap band   ??? Pulmonary embolism [facility])          Surgical History:  Past Surgical History   Procedure Laterality Date   ??? Colonoscopy     ??? Cesarean section     ??? Cholecystectomy     ??? Hysterectomy     ??? Shoulder surgery     ??? Lap band   05/01/08     Dr. Nicole Cella   ??? Back surgery       neck  plates   ??? Colonoscopy  04/08/10   ??? Other surgical history       [place] Filter: curently in place   ??? Upper gastrointestinal endoscopy  05/19/13   ??? Other surgical history  07/05/13     lap band removal   ??? Upper gastrointestinal endoscopy  07/21/13     ESOPHAGEAL STENT PLACEMENT   ??? Partial hysterectomy     ??? Upper gastrointestinal endoscopy N/A 07/31/2014     Esophagogastroduodenoscopy with esophageal balloon dilation   ??? Vascular surgery  04/2015     Lucendia Herrlich, celiac artery angiogram, normal abd arteries         Medications:  Current Facility-Administered Medications   Medication Dose Route Frequency Provider Last Rate Last Dose   ??? sodium chloride flush 0.9 % injection 10 mL  10 mL Intravenous 2 times per day Leodis Binet, MD   10 mL at 09/20/15 0839   ??? sodium chloride flush 0.9 % injection 10 mL  10 mL Intravenous PRN Leodis Binet, MD   10 mL at 09/20/15 1426   ??? dextrose 5 % 1,000 mL infusion   Intravenous Continuous Leodis Binet, MD       ??? albuterol sulfate HFA 108 (90 BASE) MCG/ACT inhaler 2 puff  2 puff Inhalation Q6H PRN Darrick Penna, MD       ??? ALPRAZolam Duanne Moron) tablet 0.5 mg  0.5 mg Oral BID PRN Darrick Penna, MD   0.5 mg at 09/20/15 0037   ??? cetirizine (ZYRTEC) tablet 10 mg  10 mg Oral Daily Darrick Penna, MD   10 mg at 09/20/15 1610   ??? cyclobenzaprine (FLEXERIL) tablet 10 mg  10 mg Oral TID PRN Darrick Penna, MD       ??? hydrochlorothiazide (HYDRODIURIL) tablet 25 mg  25 mg Oral Daily Darrick Penna, MD   25 mg at 09/19/15 9604   ??? lisinopril (PRINIVIL;ZESTRIL) tablet 40 mg  40 mg Oral Daily Darrick Penna, MD   40 mg at 09/20/15 0840   ??? metoclopramide (REGLAN) tablet 5 mg  5 mg Oral 4x Daily AC & HS Darrick Penna, MD   5 mg at 09/20/15 5409   ??? metoprolol tartrate (LOPRESSOR) tablet 150 mg  150 mg Oral BID Darrick Penna, MD   150 mg at 09/19/15 2050   ??? montelukast (SINGULAIR) tablet 10 mg  10 mg Oral Daily Darrick Penna,  MD   10 mg at 09/20/15 0840   ??? pantoprazole (PROTONIX) tablet 40 mg  40 mg Oral QAM AC Darrick Penna, MD   40 mg at 09/20/15 8119   ??? traMADol (ULTRAM) tablet 50 mg  50 mg Oral Q6H PRN Darrick Penna, MD       ??? traZODone (DESYREL) tablet 100 mg  100 mg Oral Nightly Darrick Penna, MD   100 mg at 09/20/15 0037   ??? venlafaxine (EFFEXOR XR) extended release capsule 150 mg  150 mg Oral Daily Darrick Penna, MD   150 mg at 09/20/15 0840   ??? sodium chloride flush 0.9 % injection 10 mL  10 mL Intravenous 2 times per day Darrick Penna, MD   10 mL at 09/20/15 0844   ??? sodium chloride flush 0.9 % injection 10 mL  10 mL Intravenous PRN Darrick Penna, MD   10 mL at 09/19/15 0418   ??? acetaminophen (TYLENOL) tablet 650 mg  650 mg Oral Q4H PRN Darrick Penna, MD       ??? oxyCODONE (ROXICODONE) immediate release tablet 5 mg  5 mg Oral Q4H PRN Darrick Penna, MD        Or   ??? oxyCODONE (ROXICODONE) immediate release tablet 10 mg  10 mg Oral Q4H PRN Darrick Penna, MD       ??? HYDROmorphone (DILAUDID) injection 0.25 mg  0.25 mg Intravenous Q3H PRN Darrick Penna, MD        Or   ??? HYDROmorphone (DILAUDID) injection 0.5 mg  0.5 mg Intravenous Q3H PRN Darrick Penna, MD   0.5 mg at 09/20/15 1426   ??? magnesium hydroxide (MILK OF MAGNESIA) 400 MG/5ML suspension 30 mL  30 mL Oral Daily PRN Darrick Penna, MD   30 mL at 09/19/15 2137   ???  docusate sodium (COLACE) capsule 100 mg  100 mg Oral BID PRN Darrick Penna, MD       ??? bisacodyl (DULCOLAX) suppository 10 mg  10 mg Rectal Daily PRN Darrick Penna, MD       ??? ondansetron [facility] injection 4 mg  4 mg Intravenous Q6H PRN Darrick Penna, MD   4 mg at 09/20/15 1559   ??? enoxaparin (LOVENOX) injection 40 mg  40 mg Subcutaneous Daily Darrick Penna, MD   40 mg at 09/18/15 0947   ??? nitroGLYCERIN (NITROSTAT) SL tablet 0.4 mg  0.4 mg Sublingual Q5 Min PRN Darrick Penna, MD       ??? predniSONE (DELTASONE) tablet 40 mg  40 mg Oral Daily Darrick Penna, MD   40 mg at 09/20/15  0840   ??? oxyCODONE-acetaminophen (PERCOCET) 5-325 MG per tablet 1 tablet  1 tablet Oral Once Catha Gosselin, DO   1 tablet at 09/18/15 0104           Pre-Sedation:    Pre-Sedation Documentation and Exam:  I have personally completed a history, physical exam & review of systems for this patient (see notes).  I have assessed the patient and agree with the H&P present on the chart.    Prior History of Anesthesia Complications:   none    Modified Mallampati:  I (soft palate, uvula, fauces, tonsillar pillars visible)    ASA Classification:  Class 2 ??? A normal healthy patient with mild systemic disease and Class 2 -- A normal healthy patient with mild systemic disease    Aldrete Scale:  Activity:  2 - Able to move 4 extremities voluntarily on command  Respiration:  2 - Able to breathe deeply and cough freely  Circulation:  2 - BP+/- 38mmHg of normal  Consciousness:  2 - Fully awake  Oxygen Saturation (color):  2 - Able to maintain oxygen saturation >92% on room air    Sedation/Anesthesia Plan:  Guard the patient's safety and welfare.  Minimize physical discomfort and pain.  Minimize negative psychological responses to treatment by providing sedation and analgesia and maximize the potential amnesia.  Patient to meet pre-procedure discharge plan.    Medication Planned:  midazolam intravenously, fentanyl intravenously and midazolam intravenously, fentanyl intravenously    Patient is an appropriate candidate for plan of sedation: yes      Electronically signed by Leodis Binet, MD on 09/20/2015 at 5:06 PM

## 2015-09-20 NOTE — Progress Notes (Signed)
Pt to cath lab via bed with transport and this RN. Family waiting in pt room.

## 2015-09-20 NOTE — Brief Op Note (Signed)
Cardiac Cath 09/20/15:  Anatomy:   LM-normal  LAD-normal  Cx-normal  OM1- normal  RCA-normal, codominant  RPDA- normal  LVEF- 60%    Impression:  1.  Normal coronaries  2.  Normal LV systolic function  3.  False positive stress test    Plan:  1.  Medical management  2.  Workup for noncardiac causes of chest pain and shortness of breath

## 2015-09-20 NOTE — Progress Notes (Signed)
Department of Family Practice  Adult Daily Progress Note      SUBJECTIVE      Stress test abnl yesterday, going down for cath at [facility] today.  States feeling ok, still the chest pressure.    OBJECTIVE    Physical  SpO2: 91 %  O2 Flow Rate (L/min): 2 L/min    Patient Vitals for the past 24 hrs:   BP Temp Temp src Pulse Resp SpO2 Weight   09/20/15 0835 132/86 97.8 ??F (36.6 ??C) Temporal 51 16 91 % -   09/20/15 0704 - - - - - - (!) 304 lb 11.2 oz (138.2 kg)   09/20/15 0411 132/76 97.6 ??F (36.4 ??C) Oral 54 16 95 % -   09/19/15 2029 125/66 98 ??F (36.7 ??C) Temporal 68 16 94 % -   09/19/15 1745 136/74 97.4 ??F (36.3 ??C) Temporal 66 18 - -   09/19/15 1230 120/60 98 ??F (36.7 ??C) Oral 62 16 - -     Alert, oriented x 4, NAD, obese  Lung clear  CV rrr  No edema    Data  ??Conclusions   ??   ??Summary   ??-Moderate sized anterior mostly reversible defect consistent with ischemia   ??in the territory of the mid LAD .   ??-Normal LV function.   ??-Study is limited by breast attenuation.       ASSESSMENT AND PLAN:    Principal Problem:    Chest pain  Active Problems:    Hyperlipidemia    Other complications of gastric band procedure    Essential hypertension    OSA (obstructive sleep apnea)    Dyspnea and respiratory abnormalities    Abnormal stress test    Cath today  Do not think current pain is due to cardiac, likely 2 separate things going on, discussed with pt  bp controlled

## 2015-09-21 LAB — BASIC METABOLIC PANEL
Anion Gap: 10 (ref 3–16)
BUN: 18 mg/dL (ref 7–20)
CO2: 26 mmol/L (ref 21–32)
Calcium: 8.3 mg/dL (ref 8.3–10.6)
Chloride: 107 mmol/L (ref 99–110)
Creatinine: 0.7 mg/dL (ref 0.6–1.1)
GFR African American: >60 (ref >60)
GFR Non-African American: >60 (ref >60)
Glucose: 152 mg/dL — ABNORMAL HIGH (ref 70–99)
Potassium: 3.8 mmol/L (ref 3.5–5.1)
Sodium: 143 mmol/L (ref 136–145)

## 2015-09-21 LAB — CBC
Hematocrit: 37.1 % (ref 36.0–48.0)
Hemoglobin: 11.9 g/dL — ABNORMAL LOW (ref 12.0–16.0)
MCH: 28.5 pg (ref 26.0–34.0)
MCHC: 32.1 g/dL (ref 31.0–36.0)
MCV: 88.8 fL (ref 80.0–100.0)
MPV: 10.1 fL (ref 5.0–10.5)
Platelets: 145 10*3/uL (ref 135–450)
RBC: 4.18 M/uL (ref 4.00–5.20)
RDW: 14.9 % (ref 12.4–15.4)
WBC: 10.6 10*3/uL (ref 4.0–11.0)

## 2015-09-21 MED ORDER — DEXTROSE 5 % IV SOLN
5 % | INTRAVENOUS | Status: DC
Start: 2015-09-21 — End: 2015-09-21

## 2015-09-21 MED FILL — DEXTROSE 5 % IV SOLN: 5 % | INTRAVENOUS | Qty: 1000

## 2015-09-21 MED FILL — CETIRIZINE HCL 10 MG PO TABS: 10 MG | ORAL | Qty: 1

## 2015-09-21 MED FILL — NORMAL SALINE FLUSH 0.9 % IV SOLN: 0.9 % | INTRAVENOUS | Qty: 10

## 2015-09-21 MED FILL — HYDROMORPHONE HCL 1 MG/ML IJ SOLN: 1 MG/ML | INTRAMUSCULAR | Qty: 0.5

## 2015-09-21 MED FILL — PREDNISONE 20 MG PO TABS: 20 MG | ORAL | Qty: 2

## 2015-09-21 MED FILL — METOCLOPRAMIDE HCL 10 MG PO TABS: 10 MG | ORAL | Qty: 1

## 2015-09-21 MED FILL — METOPROLOL TARTRATE 50 MG PO TABS: 50 MG | ORAL | Qty: 3

## 2015-09-21 MED FILL — PANTOPRAZOLE SODIUM 40 MG PO TBEC: 40 MG | ORAL | Qty: 1

## 2015-09-21 MED FILL — HYDROCHLOROTHIAZIDE 25 MG PO TABS: 25 MG | ORAL | Qty: 1

## 2015-09-21 MED FILL — LISINOPRIL 20 MG PO TABS: 20 MG | ORAL | Qty: 2

## 2015-09-21 MED FILL — EFFEXOR XR 150 MG PO CP24: 150 MG | ORAL | Qty: 1

## 2015-09-21 MED FILL — SINGULAIR 10 MG PO TABS: 10 MG | ORAL | Qty: 1

## 2015-09-21 MED FILL — TRAZODONE HCL 50 MG PO TABS: 50 MG | ORAL | Qty: 2

## 2015-09-21 MED FILL — LOVENOX 40 MG/0.4ML SC SOLN: 40 MG/0.4ML | SUBCUTANEOUS | Qty: 0.4

## 2015-09-21 MED FILL — ALPRAZOLAM 0.5 MG PO TABS: 0.5 MG | ORAL | Qty: 1

## 2015-09-21 NOTE — Plan of Care (Signed)
Problem: Pain:  Goal: Pain level will decrease  Pain level will decrease   Outcome: Ongoing  Pt given prn 0.5 mg dilaudid for 8/10 pain. Will continue to monitor pain level. Rachael Mays

## 2015-09-21 NOTE — Discharge Instructions (Signed)
Chest Pain: Care Instructions  Your Care Instructions  There are many things that can cause chest pain. Some are not serious and will get better on their own in a few days. But some kinds of chest pain need more testing and treatment. Your doctor may have recommended a follow-up visit in the next 8 to 12 hours. If you are not getting better, you may need more tests or treatment.  Even though your doctor has released you, you still need to watch for any problems. The doctor carefully checked you, but sometimes problems can develop later. If you have new symptoms or if your symptoms do not get better, get medical care right away.  If you have worse or different chest pain or pressure that lasts more than 5 minutes or you passed out (lost consciousness), call 911 or seek other emergency help right away.   A medical visit is only one step in your treatment. Even if you feel better, you still need to do what your doctor recommends, such as going to all suggested follow-up appointments and taking medicines exactly as directed. This will help you recover and help prevent future problems.  How can you care for yourself at home?   Rest until you feel better.   Take your medicine exactly as prescribed. Call your doctor if you think you are having a problem with your medicine.   Do not drive after taking a prescription pain medicine.  When should you call for help?  Call 911 if:    You passed out (lost consciousness).   You have severe difficulty breathing.   You have symptoms of a heart attack. These may include:   Chest pain or pressure, or a strange feeling in your chest.   Sweating.   Shortness of breath.   Nausea or vomiting.   Pain, pressure, or a strange feeling in your back, neck, jaw, or upper belly or in one or both shoulders or arms.   Lightheadedness or sudden weakness.   A fast or irregular heartbeat.  After you call 911, the operator may tell you to chew 1 adult-strength or 2 to 4 low-dose aspirin.  Wait for an ambulance. Do not try to drive yourself.  Call your doctor today if:    You have any trouble breathing.   Your chest pain gets worse.   You are dizzy or lightheaded, or you feel like you may faint.   You are not getting better as expected.   You are having new or different chest pain.  Where can you learn more?  Go to [email or web address] and sign in to your MyChart account. Enter A120 in the Search Health Information box to learn more about "Chest Pain: Care Instructions."    If you do not have an account, please click on the "Sign Up Now" link.   2006-2016 [employer]. Care instructions adapted under license by [facility]. This care instruction is for use with your licensed healthcare professional. If you have questions about a medical condition or this instruction, always ask your healthcare professional. [employer] Inc[employer] disclaims any warranty or liability for your use of this information.  Content Version: 10.9.538570; Current as of: Apr 27, 2014

## 2015-09-21 NOTE — Plan of Care (Signed)
Problem: Pain:  Goal: Pain level will decrease  Pain level will decrease   Patient is having pain. Pain score is 9 out of 10. Patient denied any needs for pain medicine. Will continue to monitor.

## 2015-09-21 NOTE — Progress Notes (Signed)
Cardiology all right with discharge awaiting confirmation on pt's ride to determine discharge time. Will continue to monitor.

## 2015-09-21 NOTE — Progress Notes (Signed)
RESPIRATORY THERAPY ASSESSMENT    Rachael Mays   [id number]  1956/03/29      Recent Surgical Procedures:  none  Pulmonary History:  none  Home Respiratory Therapy:  MDI albuterol PRN  Home Oxygen Therapy:  none  Current Respiratory Therapy: Albuterol PRN  Vitals:    09/21/15 0047   BP: 136/66   Pulse: 63   Resp: 18   Temp: 96.9 ??F (36.1 ??C)   SpO2: 97%       MPVC: uta   Actual VC: uta   Sputum:  ,  ,      ASSESSMENT OF RESPIRATORY SEVERITY INDEX (RSI)    Patients with orders for inhalation medications, oxygen, or any therapeutic treatment modality will be placed on Respiratory Protocol.  They will be assessed with the first treatment and at least every 72 hours thereafter.  The following severity scale will be used to determine frequency of treatment intervention.    Respiratory History: Smoking History Less than 1ppd or less than 15 pack year = 1    Recent Surgical History: None = 0    Level of Consciousness: Alert, Oriented, and Cooperative = 0    Level of Activity: Walking unassisted = 0    Respiratory Pattern: Regular Pattern; RR 8-20 = 0    Breath Sounds: Diminshed bilaterally and/or crackles = 2    Cough: Strong, spontaneous, non-productive = 0    SPO2 (COPD values may differ): 88-89% on room air or greater than 92% on FiO2 28- 35% = 2    Peak Flow (asthma only): not applicable = 0    RSI: 0-5 = See once and convert to home regimen or discontinue  PLAN    Goals: volume expansion and improve oxygenation    Patient educated on: Oxygenation, Incentive Spirometry and Deep Breath and Cough     Comment / Outcomes: improve oxygenation    Plan of Care:Albuterol PRN    Is patient being placed on Home Treatment Regimen? Yes      Respiratory Protocol Guidelines    1. Assessment and treatment by Respiratory Therapy will be initiated for medication and therapeutic interventions upon initiation of aerosolized medication.  2. Physician will be contacted for respiratory rate (RR) greater than 35 breaths per minute.  Therapy will be held for heart rate (HR) greater than 140 beats per minute, pending direction from physician.  3. Bronchodilators will be administered via Metered Dose Inhaler (MDI) or Hydrofluoroalkane (HFA) with spacer when the following criteria are met:  a. Alert and cooperative     b. HR < 140 bpm  c. RR < 30 bpm                d. Can demonstrate a 2???3 second inspiratory hold  4. Bronchodilators will be administered via Hand Held Nebulizer [facility]) to patients when ANY of the following criteria are met  a. Incogizant or uncooperative          b. Patients treated with HHN at Home        c. Unable to demonstrate proper MDI or HFA technique     d. RR > 30 bpm   5. Bronchodilators will be delivered via Metered Dose Inhaler (MDI) or Hydrofluoroalkane (HFA) with spacer to intubated patients on mechanical ventilation.  6. Inhalation medication orders will be delivered and/or substituted as outlined below.    Aerosolized Medications Ordering and Administration Guidelines:    1. All Medications will be ordered by a physician, and their frequency  and/or modality will be adjusted as defined by the patients Respiratory Severity Index (RSI) score.  2. If the patient does not have documented COPD, consider discontinuing anticholinergics when RSI is less than 9.  3. If the bronchospasm worsens (increased RSI), then the bronchodilator frequency can be increased to a maximum of every 4 hours.  If greater than every 4 hours is required, the physician will be contacted.  4. If the bronchospasm improves, the frequency of the bronchodilator can be decreased, based on the patient's RSI, but not less than home treatment regimen frequency.  5. Bronchodilator(s) will be discontinued if patient has a RSI less than 9 and has received no scheduled or as needed treatment for 72  Hrs.    Patients Ordered on a Mucolytic Agent:    1. Must always be administered with a bronchodilator.    2. Discontinue if patient experiences worsened  bronchospasm, or secretions have lessened to the point that the patient is able to clear them with a cough.    Anti-inflammatory and Combination Medications:    1. If the patient lacks prior history of lung disease, is not using inhaled anti-inflammatory medication at home, and lacks wheezing by examination or by history for at least 24 hours, contact physician for possible discontinuation.      Pharmacy formulary interchanges for Respiratory medications can also be found by using the Smart Phrase .[email or web address] in any EPIC comment field.

## 2015-09-21 NOTE — Discharge Summary (Signed)
Data- discharge order received, pt verbalized agreement to discharge, disposition to previous residence, no needs for HHC/DME.     Action- discharge instructions prepared and given to patient, pt verbalized understanding. Medication information packet given r/t NEW and/or CHANGED prescriptions emphasizing name/purpose/side effects, pt verbalized understanding. Discharge instruction summary: Diet- general, Activity- as tolerated, Primary Care Physician as follows: Acquanetta Sit [place], [place] [phone] f/u appointment scheduled, immunizations reviewed, prescription medications filled n/a. Inpatient surgical procedure precautions reviewed: n/a    Response- Pt belongings gathered, IV removed. Disposition is home (no HHC/DME needs), transported with belongings, taken to lobby via w/c w/ RN, no complications.

## 2015-09-21 NOTE — Discharge Summary (Signed)
[place]  Discharge Summary    Date of Admission:    09/18/2015    Date of Discharge:     09/21/15     Admission Diagnoses:    Patient Active Problem List   Diagnosis   ??? Back pain   ??? Deep vein thrombosis ([place])   ??? Depression   ??? Hyperlipidemia   ??? Pulmonary embolism ([place])   ??? Nausea & vomiting   ??? Abdominal  pain, other specified site   ??? Morbid obesity ([place])   ??? Status post gastric banding   ??? Vertigo   ??? Dizziness   ??? Tinnitus, subjective   ??? Other complications of gastric band procedure   ??? Essential hypertension   ??? Hyperglycemia   ??? Chest pain   ??? 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 Adrenal mass, left ([place])   ??? Adrenal nodule ([place])   ??? OSA (obstructive sleep apnea)   ??? Dyspnea and respiratory abnormalities   ??? Abnormal stress test       Discharge Diagnoses:    Active Hospital Problems    Diagnosis Date Noted   ??? Abnormal stress test [R94.39] 09/19/2015   ??? OSA (obstructive sleep apnea) [G47.33] 09/18/2015   ??? Dyspnea and respiratory abnormalities [R06.00, R06.89]    ??? Chest pain [R07.9] 01/12/2015   ??? Essential hypertension [I10] 07/24/2013   ??? Other complications of gastric band procedure [K95.09] 07/05/2013   ??? Hyperlipidemia [E78.5] 01/23/2009       Consultants:    Dr. Susy Manor, cardiology    Reason for Admission:    Rachael Mays is a 59 y.o. year old female who presented to the ED with chest pain. Patient stated that she woke up morning prior to admission not feeing well. She was fixing coffee and had onset of chest tightness and sob. She tried drinking coffee as sometimes this will help when she gets this, but it didn't improve so she came to the ED. She continued to have a tightness in her chest and a feeling like she could not take a deep breath. No worsening with exertion or deep breathing. She had a dry cough but nothing different from her usual.      Hospital Course:    D dimer positive but CTA chest without evidence of PE.  Cardiology consult obtained.  Stress test was performed and  abnormal as below.  Cardiac cath was recommended and revealed normal coronaries and normal LV systolic function.    Procedures/Tests Performed:    Cardiac Perfusion Imaging   ??   ??Demographics   ??   ??Patient Name ?? ?? ??[name]   ??   ??Date of Study ?? ?? 09/19/2015 ?? ?? ?? ?? ??Gender ?? ?? ?? ?? ?? ?? ??Female   ??   ??Patient Number ?? ??[id number] ?? ?? ?? ?? ??Date of Birth ?? ?? ?? [birth year]   ??   ??Visit Number ?? ?? ??[id number] ?? ?? ?? ?? Age ?? ?? ?? ?? ?? ?? ?? ?? 59 year(s)   ??   ??Accession Number ??[id number] ?? ?? ?? ?? ?? Room Number ?? ?? ?? ?? 3357   ??   ??Corporate ID ?? ?? ??[id number] ?? ?? ?? ?? ?? ??NM Technician ?? ?? ?? Behymer, Beverly,   ???? ?? ?? ?? ?? ?? ?? ?? ?? ?? ?? ?? ?? ?? ?? ?? ?? ?? ?? ?? ?? ?? ?? ?? ?? ?? ?? ?? ?? CNMT   ??   ??  Nurse ?? ?? ?? ?? ?? ?? [place], [place], Interpreting ?? ?? ?? ??Clement Sayres M,   ???? ?? ?? ?? ?? ?? ?? ?? ?? RN ?? ?? ?? ?? ?? ?? ?? ?? ??Physician ?? ?? ?? ?? ?? MD, [facility]   ??   ??Ordering ?? ?? ?? ?? ??Farley Ly,   ??Physician ?? ?? ?? ?? MD   ??   ??The procedure was explained in detail to the patient. Risks,   ??complications and alternative treatments were reviewed. Written consent   ??was obtained.   ??   Procedure   Procedure Type:   ??   ??Nuclear Stress Test:Pharmacological, NM MYOCARDIAL SPECT REST EXERCISE OR   ??RX   ??   ??Study location: [place]   ??   ??Indications: Chest pain.   ??   ???? ?? ?? ?? ?? ??Hospital Status: Inpatient.   ??   ??Risk Factors   ??   ??The patient risk factors include:peripheral arterial disease, obesity,   ??former tobacco use, treated and controlled hypercholesterolemia, treated and   ??controlled hypertension, family history of premature CAD appeared at age 57,   ??chronic lung disease, dyslipidemia and ( years not smoking: 3).   ??   ??Conclusions   ??   ??Summary   ??-Moderate sized anterior mostly reversible defect consistent with ischemia   ??in the territory of the mid LAD .   ??-Normal LV function.   ??-Study is limited by breast attenuation.   ??     Narrative   EXAMINATION:   CTA OF THE CHEST 09/17/2015 11:14 pm   ??    TECHNIQUE:   CTA of the chest was performed after the administration of intravenous   contrast. ??Multiplanar reformatted images are provided for review. ??MIP   images are provided for review. Dose modulation, iterative reconstruction,   and/or weight based adjustment of the mA/kV was utilized to reduce the   radiation dose to as low as reasonably achievable.   ??   COMPARISON:   05/03/2015   ??   HISTORY:   ORDERING SYSTEM PROVIDED HISTORY: CHEST PAIN, ACUTE, PULMONARY EMBOLISM   SUSPECTED   TECHNOLOGIST PROVIDED HISTORY:   If patient is on cardiac monitor and/or pulse ox, they may be taken off   cardiac monitor and pulse ox, left on O2 if currently on. All monitors   reattached when patient returns to room.   Ordering Physician Provided Reason for Exam: chest pain   CHEST PAIN, ACUTE, PULMONARY EMBOLISM SUSPECTED   Acuity: Acute   Type of Exam: Initial   Relevant Medical/Surgical History: pt presents with c/o sharp pain with deep   breaths and states she cannot get a deep breath. pt was seen here for   samething on the 09/12/15--dx with thoracic back pain. pain is 8/10.   ??   FINDINGS:   Pulmonary Arteries: Pulmonary arteries are adequately opacified for   evaluation. ??No evidence of intraluminal filling defect to suggest pulmonary   embolism. ??Main pulmonary artery is normal in caliber.   ??   Mediastinum: No evidence of mediastinal lymphadenopathy. ??The heart and   pericardium demonstrate no acute abnormality. ??There is no acute abnormality   of the thoracic aorta.   ??   Lungs/pleura: The lungs are without acute process. ??No focal consolidation or   pulmonary edema. ??No evidence of pleural effusion or pneumothorax.   ??   Upper Abdomen: Small right adrenal nodule again seen measuring approximately  1 cm. ??There is a small splenule.   ??   Soft Tissues/Bones: No acute bone or soft tissue abnormality.   ??   ??   Impression   No evidence of pulmonary embolism or acute pulmonary abnormality.     Narrative   EXAMINATION:    TWO VIEWS OF THE CHEST   ??   09/17/2015 7:53 pm   ??   COMPARISON:   September 12, 2015   ??   HISTORY:   ORDERING SYSTEM PROVIDED HISTORY: cp with deep inhalations.   TECHNOLOGIST PROVIDED HISTORY:   Ordering Physician Provided Reason for Exam: cp with deep inhalations.   Acuity: Unknown   Type of Exam: Unknown   ??   FINDINGS:   Cardiac silhouette is mildly enlarged. ??No pneumothorax. ??No pleural   effusion. ??No consolidation. ??No acute bony abnormality. ??Mild bronchial wall   thickening centrally.   ??   ??   Impression   Mild bronchial wall thickening centrally, increased since previous exam. ??No   focal consolidation.   ??     Leodis Binet, MD Physician Signed Cardiology Brief Op Note Date of Service: 09/20/2015 ??5:41 PM           Cardiac Cath 09/20/15:  Anatomy:   LM-normal  LAD-normal  Cx-normal  OM1- normal  RCA-normal, codominant  RPDA- normal  LVEF- 60%  ??  Impression:  1. Normal coronaries  2. Normal LV systolic function  3. False positive stress test  ??  Plan:  1. Medical management  2. Workup for noncardiac causes of chest pain and shortness of breath            Discharge Exam:    Visit Vitals   ??? BP 131/81   ??? Pulse 58   ??? Temp 97.6 ??F (36.4 ??C) (Temporal)   ??? Resp 16   ??? Ht 5\' 6"  (1.676 m)   ??? Wt (!) 308 lb 12.8 oz (140.1 kg)  Comment: bed   ??? SpO2 96%   ??? BMI 49.84 kg/m2     General-alert, no acute distress  Cardiovascular system-regular rate and rhythm, no murmur  Respiratory-clear to auscultation bilaterally, no wheezes, rales, or rhonchi  Abdomen-+bs, soft, nt, nd  Extremities-no edema bilateral lower extremities  Skin-no rash    Discharge Medications:       Rachael, Mays   Home Medication Instructions VWU:[id number]    Printed on:09/21/15 1124   Medication Information                      albuterol (PROVENTIL HFA) 108 (90 BASE) MCG/ACT inhaler  Inhale 2 puffs into the lungs every 6 hours as needed for Wheezing             ALPRAZolam (XANAX) 0.5 MG tablet  Take 1 tablet by mouth 2 times daily as  needed for Anxiety             CALCIUM-VITAMIN D PO  Take by mouth             cetirizine (ZYRTEC ALLERGY) 10 MG tablet  Take 10 mg by mouth daily.             cyclobenzaprine (FLEXERIL) 10 MG tablet  Take 1 tablet by mouth 3 times daily as needed for Muscle spasms             fluocinonide (LIDEX) 0.05 % cream  Apply topically 2 times daily.  hydrochlorothiazide (HYDRODIURIL) 25 MG tablet  Take 1 tablet by mouth daily             lisinopril (PRINIVIL;ZESTRIL) 40 MG tablet  Take 40 mg by mouth daily             metoclopramide (REGLAN) 5 MG tablet  TAKE ONE TABLET BY MOUTH FOUR TIMES A DAY BEFORE MEALS AND AT BEDTIME             metoprolol (LOPRESSOR) 100 MG tablet  Take 1.5 tablets by mouth 2 times daily             montelukast (SINGULAIR) 10 MG tablet  TAKE 1 TABLET BY MOUTH DAILY             Multiple Vitamins-Minerals (MULTIVITAMIN PO)  Take by mouth             nystatin 100000 UNIT/GM POWD  APPLY 3 TIMES DAILY             omeprazole (PRILOSEC) 40 MG capsule  Take 1 capsule by mouth daily.             ondansetron (ZOFRAN) 4 MG tablet  Take 1 tablet by mouth daily as needed for Nausea or Vomiting             predniSONE (DELTASONE) 20 MG tablet  Take 2 tablets by mouth daily for 5 days             traMADol (ULTRAM) 50 MG tablet  Take 1 tablet by mouth every 6 hours as needed for Pain             traZODone (DESYREL) 100 MG tablet  Take 1 tablet by mouth nightly             venlafaxine (EFFEXOR XR) 150 MG XR capsule  Take 1 capsule by mouth daily                 Pertinent Labs on Discharge:    Lab Results   Component Value Date    WBC 10.6 09/21/2015    HGB 11.9 (L) 09/21/2015    HCT 37.1 09/21/2015    MCV 88.8 09/21/2015    PLT 145 09/21/2015     Lab Results   Component Value Date    CREATININE 0.7 09/21/2015    BUN 18 09/21/2015    NA 143 09/21/2015    K 3.8 09/21/2015    CL 107 09/21/2015    CO2 26 09/21/2015     Lab Results   Component Value Date    DDIMER 311 (H) 09/17/2015     Discharge Instructions:       Follow up with Dr. Murrell Converse within 1 week.

## 2015-09-21 NOTE — Progress Notes (Signed)
[place]              Progress Note      Admit Date 09/18/2015  HPI: presented to the hospital with complaints of knife like sharp pain but also a dull component left chest and both lower ribs. +Nausea. No diaphoresis. + Shortness of breath. She describes approximately a two-week history of progressive shortness of breath and cough and the chest discomfort is been the past 3-4 days. She has had intermittent episodes of coughing.     Interval history:  S/P cardiac cath : POD #1: essentially normal epi arteries; false positive stress test    [name]   Subjective: continues to c/o left chest/breast pain with back radiation that is constant in duration; does not worsen with deep breaths. C/O SOB at rest. Plans for sleep study in December       Scheduled Meds:  ??? sodium chloride flush  10 mL Intravenous 2 times per day   ??? sodium chloride flush  10 mL Intravenous 2 times per day   ??? cetirizine  10 mg Oral Daily   ??? hydrochlorothiazide  25 mg Oral Daily   ??? lisinopril  40 mg Oral Daily   ??? metoclopramide  5 mg Oral 4x Daily AC & HS   ??? metoprolol  150 mg Oral BID   ??? montelukast  10 mg Oral Daily   ??? pantoprazole  40 mg Oral QAM AC   ??? traZODone  100 mg Oral Nightly   ??? venlafaxine  150 mg Oral Daily   ??? sodium chloride flush  10 mL Intravenous 2 times per day   ??? enoxaparin  40 mg Subcutaneous Daily   ??? predniSONE  40 mg Oral Daily   ??? oxyCODONE-acetaminophen  1 tablet Oral Once     Continuous Infusions:  ??? IV infusion builder       PRN Meds:sodium chloride flush, acetaminophen, sodium chloride flush, albuterol sulfate HFA, ALPRAZolam, cyclobenzaprine, traMADol, sodium chloride flush, acetaminophen, oxyCODONE **OR** oxyCODONE, HYDROmorphone **OR** HYDROmorphone, magnesium hydroxide, docusate sodium, bisacodyl, ondansetron, nitroGLYCERIN       Objective:     Wt Readings from Last 3 Encounters:   09/21/15 (!) 308 lb 12.8 oz (140.1 kg)   09/12/15 290 lb (131.5 kg)   09/03/15 290 lb (131.5 kg)   Admit  weight: Weight: 290 lb (131.5 kg)      Temperature range over 24hrs:   Temp  Avg: 96.9 ??F (36.1 ??C)  Min: 96.6 ??F (35.9 ??C)  Max: 97.6 ??F (36.4 ??C)  Current Respiratory Rate:  Resp: 16  Current Pulse:  Pulse: 58  Current Blood Pressure:  BP: 131/81  24hr Blood Pressure Range:  Systolic (28UXL), KGM:010 , Min:127 , UVO:536   ; Diastolic (64QIH), KVQ:25, Min:66, Max:88    Current Pulse Oximetry:  SpO2: 96 %    No intake or output data in the 24 hours ending 09/21/15 1232    Telemetry monitor:     Physical Exam:  General:  Awake, alert, NAD  Skin:  Warm and dry; Rt groin unremarkable  Neck:  No JVD  Chest:  Clear to auscultation, respiration easy  Cardiovascular:  RRR S1S2 no murmur to auscultation  Abdomen:  Bowel sounds normal, abd soft, non-tender  Extremities:  No edema  GU: unremarkable      Imaging    Nuclear stress: 09/18/15:  Summary   ??-Moderate sized anterior mostly reversible defect consistent with ischemia   ??in the territory of the  mid LAD .   ??-Normal LV function.   ??-Study is limited by breast attenuation.       Cardiac Cath 09/20/15:  Anatomy:   LM-normal  LAD-normal  Cx-normal  OM1- normal  RCA-normal, codominant  RPDA- normal  LVEF- 60%  ??  Impression:  1. Normal coronaries  2. Normal LV systolic function  3. False positive stress test  ??  Plan:  1. Medical management  2. Workup for noncardiac causes of chest pain and shortness of breath    Lab Review     Renal Profile:   Lab Results   Component Value Date    CREATININE 0.7 09/21/2015    CREATININE 1.2 05/04/2014    BUN 18 09/21/2015    NA 143 09/21/2015    K 3.8 09/21/2015    CL 107 09/21/2015    CO2 26 09/21/2015     CBC:    Lab Results   Component Value Date    WBC 10.6 09/21/2015    RBC 4.18 09/21/2015    HGB 11.9 09/21/2015    HCT 37.1 09/21/2015    MCV 88.8 09/21/2015    RDW 14.9 09/21/2015    PLT 145 09/21/2015     BNP:  No results found for: BNP  Fasting Lipid Panel:    Lab Results   Component Value Date    CHOL 208 05/04/2014    HDL 49  05/04/2014    HDL 52 03/31/2011    TRIG 137 05/04/2014     Cardiac Enzymes:    Lab Results   Component Value Date    CKTOTAL 189 01/11/2015    TROPONINI <0.01 09/18/2015    TROPONINI 0.00 05/04/2014    TROPONINI <0.01 05/15/2011     PT/ INR   Lab Results   Component Value Date    INR 1.03 09/19/2015    INR 0.99 09/17/2015    INR 0.95 04/08/2015    INR 0.95 05/15/2011    PROTIME 11.8 09/19/2015    PROTIME 11.3 09/17/2015    PROTIME 10.8 04/08/2015     PTT No results found for: PTT   Lab Results   Component Value Date    MG 2.10 01/11/2015      Lab Results   Component Value Date    TSH 1.37 08/22/2015    TSH 1.42 03/31/2011       Assessment:     Patient Active Problem List    Diagnosis Date Noted   ??? Abnormal stress test  ~false positive: neg cath for disease 09/19/2015        ??? Dyspnea and respiratory abnormalities  ~singular / prednisone   ~neg to exam for crackles / wheezing         ??? Chest pain  ~atypical / constant in duration  ~neg cor disease by cath 01/12/2015             ??? Essential hypertension  ~controlled 07/24/2013       Plan:     1. No further cardiac testing needed. Pursue non-cardiac causes of symptoms   2. Plans for discharge today

## 2015-09-22 NOTE — Care Coordination-Inpatient (Signed)
2nd attempt to reach patient for follow up from discharge. Left voice mail message for return call.     Toni Arthurs RN, BSN   Care Transition Coordinator  [phone]

## 2015-09-22 NOTE — Care Coordination-Inpatient (Signed)
Attempted to reach patient for transition follow up. Left a voice message on home phone for return call.    Toni Arthurs RN, BSN   Care Transition Coordinator  [phone]

## 2015-09-23 ENCOUNTER — Ambulatory Visit
Admit: 2015-09-23 | Discharge: 2015-09-23 | Payer: PRIVATE HEALTH INSURANCE | Attending: Family Medicine | Primary: Student in an Organized Health Care Education/Training Program

## 2015-09-23 DIAGNOSIS — R0600 Dyspnea, unspecified: Secondary | ICD-10-CM

## 2015-09-23 NOTE — Progress Notes (Signed)
Patient is here for a routine check up appt.    Is having increased shortness of breath and dizziness.    Went to [facility] last week, had a CT and heart cath.  Told these came normal.

## 2015-09-23 NOTE — Progress Notes (Signed)
Subjective:      Patient ID: Rachael Mays is a 59 y.o. female.    HPI    Here for follow up after hospitalization 09/18/2015-09/21/2015.  She presented to the ED with chest pain. Patient stated that she woke up morning prior to admission not feeing well. She was fixing coffee and had onset of chest tightness and sob. She tried drinking coffee as sometimes this will help when she gets this, but it didn't improve so she came to the ED. She continued to have a tightness in her chest and a feeling like she could not take a deep breath. No worsening with exertion or deep breathing. She had a dry cough but nothing different from her usual.     D dimer positive but CTA chest without evidence of PE. Cardiology consult obtained. Stress test was performed and abnormal as below. Cardiac cath was recommended and revealed normal coronaries and normal LV systolic function.    Continues to have dyspnea.  Feels like there is a bag over her head.  No cp.    Review of Systems    Patient Active Problem List   Diagnosis   ??? Back pain   ??? Deep vein thrombosis ([place])   ??? Depression   ??? Hyperlipidemia   ??? Pulmonary embolism ([place])   ??? Nausea & vomiting   ??? Abdominal  pain, other specified site   ??? Morbid obesity ([place])   ??? Status post gastric banding   ??? Vertigo   ??? Dizziness   ??? Tinnitus, subjective   ??? Other complications of gastric band procedure   ??? Essential hypertension   ??? Hyperglycemia   ??? Chest pain   ??? 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 Adrenal mass, left ([place])   ??? Adrenal nodule ([place])   ??? OSA (obstructive sleep apnea)   ??? Dyspnea and respiratory abnormalities   ??? Abnormal stress test       Outpatient Prescriptions Marked as Taking for the 09/23/15 encounter (Office Visit) with Harvel Quale, MD   Medication Sig Dispense Refill   ??? cyclobenzaprine (FLEXERIL) 10 MG tablet Take 1 tablet by mouth 3 times daily as needed for Muscle spasms 30 tablet 0   ??? traMADol (ULTRAM) 50 MG tablet Take 1 tablet by mouth every 6 hours as  needed for Pain 20 tablet 0   ??? metoprolol (LOPRESSOR) 100 MG tablet Take 1.5 tablets by mouth 2 times daily 90 tablet 3   ??? metoclopramide (REGLAN) 5 MG tablet TAKE ONE TABLET BY MOUTH FOUR TIMES A DAY BEFORE MEALS AND AT BEDTIME 120 tablet 0   ??? montelukast (SINGULAIR) 10 MG tablet TAKE 1 TABLET BY MOUTH DAILY 90 tablet 1   ??? hydrochlorothiazide (HYDRODIURIL) 25 MG tablet Take 1 tablet by mouth daily 90 tablet 0   ??? lisinopril (PRINIVIL;ZESTRIL) 40 MG tablet Take 40 mg by mouth daily     ??? traZODone (DESYREL) 100 MG tablet Take 1 tablet by mouth nightly 90 tablet 1   ??? ALPRAZolam (XANAX) 0.5 MG tablet Take 1 tablet by mouth 2 times daily as needed for Anxiety 60 tablet 0   ??? ondansetron (ZOFRAN) 4 MG tablet Take 1 tablet by mouth daily as needed for Nausea or Vomiting 30 tablet 1   ??? venlafaxine (EFFEXOR XR) 150 MG XR capsule Take 1 capsule by mouth daily 90 capsule 3   ??? nystatin 100000 UNIT/GM POWD APPLY 3 TIMES DAILY 30 g 4   ??? fluocinonide (  LIDEX) 0.05 % cream Apply topically 2 times daily. 1 Tube 1   ??? Multiple Vitamins-Minerals (MULTIVITAMIN PO) Take by mouth     ??? CALCIUM-VITAMIN D PO Take by mouth     ??? albuterol (PROVENTIL HFA) 108 (90 BASE) MCG/ACT inhaler Inhale 2 puffs into the lungs every 6 hours as needed for Wheezing 3 Inhaler 3   ??? omeprazole (PRILOSEC) 40 MG capsule Take 1 capsule by mouth daily. 30 capsule 3   ??? cetirizine (ZYRTEC ALLERGY) 10 MG tablet Take 10 mg by mouth daily.         Allergies   Allergen Reactions   ??? Morphine Itching   ??? Talwin [Pentazocine] Other (See Comments)     dizzy       Social History   Substance Use Topics   ??? Smoking status: Former Smoker     Packs/day: 0.00     Years: 35.00     Types: Cigarettes     Quit date: 08/07/2012   ??? Smokeless tobacco: Never Used   ??? Alcohol use No       Objective:     Visit Vitals   ??? BP 120/70   ??? Pulse 64   ??? Wt (!) 306 lb (138.8 kg)   ??? SpO2 98%   ??? BMI 49.39 kg/m2       Physical Exam   Constitutional: She is oriented to person, place,  and time. She appears well-developed and well-nourished. No distress.   Cardiovascular: Normal rate, regular rhythm and normal heart sounds.    No murmur heard.  Pulmonary/Chest: Effort normal and breath sounds normal. She has no wheezes. She has no rales.   Neurological: She is alert and oriented to person, place, and time.   Psychiatric: She has a normal mood and affect. Her behavior is normal.       Assessment:     1. Dyspnea, unspecified type       Plan:     Needs to see pulmonology--reduced diffusion capacity on PFTs 11/2014.  Echocardiogram to evaluate for diastolic dysfunction.

## 2015-09-23 NOTE — Care Coordination-Inpatient (Signed)
CTC attempted call, left message with contact information requesting call back and also notified this was final attempt to reach.    Jonna Clark, Care Transition Coordinator [phone]

## 2015-09-23 NOTE — Addendum Note (Signed)
Addended by: Burns Spain on: 09/23/2015 02:54 PM     Modules accepted: Orders

## 2015-09-24 LAB — EKG 12-LEAD
Atrial Rate: 62 {beats}/min
Diagnosis: NORMAL
P Axis: 46 degrees
P-R Interval: 134 ms
Q-T Interval: 432 ms
QRS Duration: 74 ms
QTc Calculation (Bazett): 438 ms
R Axis: 55 degrees
T Axis: 59 degrees
Ventricular Rate: 62 {beats}/min

## 2015-09-26 MED ORDER — METOPROLOL TARTRATE 100 MG PO TABS
100 MG | ORAL_TABLET | Freq: Two times a day (BID) | ORAL | 1 refills | Status: DC
Start: 2015-09-26 — End: 2015-09-26

## 2015-09-26 MED ORDER — METOPROLOL TARTRATE 100 MG PO TABS
100 MG | ORAL_TABLET | Freq: Two times a day (BID) | ORAL | 0 refills | Status: DC
Start: 2015-09-26 — End: 2015-09-26

## 2015-09-26 MED ORDER — METOPROLOL TARTRATE 100 MG PO TABS
100 MG | ORAL_TABLET | Freq: Two times a day (BID) | ORAL | 0 refills | Status: DC
Start: 2015-09-26 — End: 2016-04-08

## 2015-09-26 NOTE — Telephone Encounter (Signed)
rxs sent

## 2015-09-26 NOTE — Telephone Encounter (Signed)
Pt states TS increased her Metoprolol at last visit and since increased she ran out of med early.  Please send new rx for increased dose to [facility] AND to patient's mail order pharmacy. [facility] mail order)

## 2015-10-07 ENCOUNTER — Inpatient Hospital Stay: Attending: Family Medicine | Primary: Student in an Organized Health Care Education/Training Program

## 2015-10-07 DIAGNOSIS — R0600 Dyspnea, unspecified: Secondary | ICD-10-CM

## 2015-10-07 LAB — ECHOCARDIOGRAM COMPLETE 2D W DOPPLER W COLOR: Left Ventricular Ejection Fraction: 55

## 2015-10-07 LAB — EJECTION FRACTION PERCENTAGE: Left Ventricular Ejection Fraction High Value: 55 %

## 2015-10-09 NOTE — Telephone Encounter (Signed)
Patient called with Blood pressure readings    10/18  AM 179/93  P 60    10/19  AM  179/97 P 62  PM  158/81  P 61  10/20  AM  164/81 P 63  PM  173/81  P 63  10/21  AM  176/98  P 58    10/22  AM  159/94  P 68  PM  140/82 P 68  10/23  AM  148/88  P 74  PM  150/88 P  65  10/24  AM  179/90  P 65  PM  171/95  P 65  1025   AM  171/82  P  64  PM  174/87  P 67  10/26  AM  169/92  P 66  PM  172/88  P 63  10/27  AM  151/91  P  64  PM  154/78  P 69  10/28 AM  141/86  P 65  PM  172/86  P 70  10/29  AM  179/93 P 71  PM  136/76  P 69  10/30  AM 158/99  P 69  PM  121/72  P 78  10/31 AM  159/96 P 69  PM  152/95 P 75  11/01 AM 175/97 P 70  PM  143/83 P 63  11/02  AM 136/69 P 58

## 2015-10-09 NOTE — Telephone Encounter (Signed)
See below

## 2015-10-10 MED ORDER — METOCLOPRAMIDE HCL 5 MG PO TABS
5 MG | ORAL_TABLET | ORAL | 0 refills | Status: DC
Start: 2015-10-10 — End: 2015-12-19

## 2015-10-10 NOTE — Telephone Encounter (Signed)
Left message for patient to call back.

## 2015-10-10 NOTE — Telephone Encounter (Signed)
norvasc 5mg  daily

## 2015-10-10 NOTE — Telephone Encounter (Signed)
Yes

## 2015-10-10 NOTE — Telephone Encounter (Signed)
Referral ok?  Please advise

## 2015-10-10 NOTE — Telephone Encounter (Signed)
Patient called concerning a referral to a pulmonologist.  She can't breath and she really wants to get this going.  Please call her at either [phone] or [phone].

## 2015-10-11 ENCOUNTER — Encounter

## 2015-10-11 MED ORDER — AMLODIPINE BESYLATE 5 MG PO TABS
5 MG | ORAL_TABLET | Freq: Every day | ORAL | 3 refills | Status: DC
Start: 2015-10-11 — End: 2015-10-28

## 2015-10-11 NOTE — Telephone Encounter (Signed)
Left message for patient to call back and see note in result notes

## 2015-10-11 NOTE — Telephone Encounter (Signed)
Patient is informed that referral has been sent.

## 2015-10-11 NOTE — Telephone Encounter (Signed)
Patient notified and states to let her know when the referral has been sent

## 2015-10-11 NOTE — Telephone Encounter (Signed)
Patient notified and medication has been sent to the pharmacy.

## 2015-10-16 LAB — URINE DRUG SCREEN
Amphetamine Screen, Urine: NEGATIVE
Barbiturate Screen, Ur: NEGATIVE (ref <200)
Benzodiazepine Screen, Urine: NEGATIVE (ref <200)
Cannabinoid Scrn, Ur: NEGATIVE (ref <50)
Cocaine Metabolite Screen, Urine: NEGATIVE (ref <300)
Methadone Screen, Urine: NEGATIVE (ref <300)
Opiate Scrn, Ur: NEGATIVE (ref <300)
Oxycodone Urine: NEGATIVE (ref <100)
PCP Screen, Urine: NEGATIVE (ref <25)
Propoxyphene Scrn, Ur: NEGATIVE (ref <300)
pH, UA: 5

## 2015-10-23 ENCOUNTER — Encounter

## 2015-10-23 ENCOUNTER — Inpatient Hospital Stay: Admit: 2015-10-23 | Attending: Anesthesiology | Primary: Sports Medicine

## 2015-10-23 DIAGNOSIS — M5125 Other intervertebral disc displacement, thoracolumbar region: Secondary | ICD-10-CM

## 2015-10-25 MED ORDER — HYDROCHLOROTHIAZIDE 25 MG PO TABS
25 MG | ORAL_TABLET | ORAL | 1 refills | Status: DC
Start: 2015-10-25 — End: 2016-03-10

## 2015-10-25 NOTE — Telephone Encounter (Signed)
Patient called to give last few weeks of BP readings.    Date:  BP AM P     BP PM      P  11/3  143/84         70 145/80           67  11/4  139/15         67 139/76           72  11/5   166/98         72 141/67           70  11/6  126/59         66 140/76           55  11/7  165/80         72  140/78           63  11/8  132/78         65 157/92           68  11/9  161/89         65 154/80           67  11/10  172/97         64 135/66           57    11/11  154/63         61 133/72           66 - TS added another BP med on this day.  11/12  159/77         66 160/97           63  11/13  144/96         66 147/77           63   11/14  162/86         72 135/81           64  11/15   149/80         58 139/73           60  11/16  159/90         90 147/84           61  11/17  165/88         72 152/75           65   11/18  146/96         65

## 2015-10-26 NOTE — Telephone Encounter (Signed)
Increase Norvasc to 10mg  daily.

## 2015-10-28 MED ORDER — AMLODIPINE BESYLATE 10 MG PO TABS
10 MG | ORAL_TABLET | Freq: Every day | ORAL | 3 refills | Status: DC
Start: 2015-10-28 — End: 2015-10-28

## 2015-10-28 MED ORDER — AMLODIPINE BESYLATE 10 MG PO TABS
10 MG | ORAL_TABLET | Freq: Every day | ORAL | 3 refills | Status: DC
Start: 2015-10-28 — End: 2015-11-12

## 2015-10-28 NOTE — Telephone Encounter (Signed)
Patient notified and rx was sent per patient request.

## 2015-10-28 NOTE — Telephone Encounter (Signed)
Left message for patient to call back.

## 2015-11-06 ENCOUNTER — Ambulatory Visit
Admit: 2015-11-06 | Discharge: 2015-11-06 | Payer: PRIVATE HEALTH INSURANCE | Attending: Critical Care Medicine | Primary: Sports Medicine

## 2015-11-06 DIAGNOSIS — R0602 Shortness of breath: Secondary | ICD-10-CM

## 2015-11-06 NOTE — Communication Body (Signed)
Assessment:    Assessment:    1. SOB (shortness of breath)     2. OSA (obstructive sleep apnea)     3. Chronic obstructive pulmonary disease, unspecified COPD type ([place])             Plan:    Plan:    1. I discussed with patient the above  2. I reviewed her CTA and explained findings  3. No clear disease on CT chest  4. I will repeat full PFT and try Anoro for possible COPD  5. Her SOB might be only due to morbid obesity(gained 80 lbs over last year) and deconditioning  6. She is scheduled to see sleep medicine for OSA treatment  7. RTC in 1 month

## 2015-11-06 NOTE — Progress Notes (Signed)
Rachael Mays is 59 y.o. female here for Pulmonary Medicine consultation referred by Dr. Rayford Halsted for evaluation of had concerns including Shortness of Breath.   Patient has hx of PE over 30 years ago while on birth control pills  She failed lap band and gained 80 lbs over last year  She has been complaining of SOB  She was admitted last month for chest pain as well and angiogram was negative  Her CTA was read as:  EXAMINATION:   CTA OF THE CHEST 09/17/2015 11:14 pm   ??   TECHNIQUE:   CTA of the chest was performed after the administration of intravenous   contrast. ??Multiplanar reformatted images are provided for review. ??MIP   images are provided for review. Dose modulation, iterative reconstruction,   and/or weight based adjustment of the mA/kV was utilized to reduce the   radiation dose to as low as reasonably achievable.   ??   COMPARISON:   05/03/2015   ??   HISTORY:   ORDERING SYSTEM PROVIDED HISTORY: CHEST PAIN, ACUTE, PULMONARY EMBOLISM   SUSPECTED   TECHNOLOGIST PROVIDED HISTORY:   If patient is on cardiac monitor and/or pulse ox, they may be taken off   cardiac monitor and pulse ox, left on O2 if currently on. All monitors   reattached when patient returns to room.   Ordering Physician Provided Reason for Exam: chest pain   CHEST PAIN, ACUTE, PULMONARY EMBOLISM SUSPECTED   Acuity: Acute   Type of Exam: Initial   Relevant Medical/Surgical History: pt presents with c/o sharp pain with deep   breaths and states she cannot get a deep breath. pt was seen here for   samething on the 09/12/15--dx with thoracic back pain. pain is 8/10.   ??   FINDINGS:   Pulmonary Arteries: Pulmonary arteries are adequately opacified for   evaluation. ??No evidence of intraluminal filling defect to suggest pulmonary   embolism. ??Main pulmonary artery is normal in caliber.   ??   Mediastinum: No evidence of mediastinal lymphadenopathy. ??The heart and   pericardium demonstrate no acute abnormality. ??There is no acute abnormality    of the thoracic aorta.   ??   Lungs/pleura: The lungs are without acute process. ??No focal consolidation or   pulmonary edema. ??No evidence of pleural effusion or pneumothorax.   ??   Upper Abdomen: Small right adrenal nodule again seen measuring approximately   1 cm. ??There is a small splenule.   ??   Soft Tissues/Bones: No acute bone or soft tissue abnormality.   ??   ??   Impression   No evidence of pulmonary embolism or acute pulmonary abnormality     She smoked for 40 years and quitted in 2013  Her last PFT 11-2014 showed no clear obstructive changes    Past Medical History   Diagnosis Date   ??? Anxiety    ??? Asthma    ??? Chronic back pain    ??? Deep vein thrombosis    ??? Depression    ??? GERD (gastroesophageal reflux disease)      NO LONGER SINCE LAP   ??? GERD (gastroesophageal reflux disease) 01/23/2009   ??? Hyperlipidemia      hx; resolved with lap band   ??? Hypertension    ??? Obesity      hx of; had lap band   ??? Pulmonary embolism [facility])      Current Outpatient Prescriptions   Medication Sig Dispense Refill   ??? naproxen (  NAPROSYN) 500 MG tablet Take 500 mg by mouth 2 times daily (with meals)     ??? cyclobenzaprine (FLEXERIL) 10 MG tablet Take 10 mg by mouth as needed for Muscle spasms     ??? amLODIPine (NORVASC) 10 MG tablet Take 1 tablet by mouth daily 90 tablet 3   ??? hydrochlorothiazide (HYDRODIURIL) 25 MG tablet TAKE ONE TABLET BY MOUTH DAILY 90 tablet 1   ??? metoclopramide (REGLAN) 5 MG tablet TAKE ONE TABLET BY MOUTH FOUR TIMES A DAY BEFORE MEALS AND AT BEDTIME 120 tablet 0   ??? metoprolol (LOPRESSOR) 100 MG tablet Take 1.5 tablets by mouth 2 times daily 270 tablet 0   ??? montelukast (SINGULAIR) 10 MG tablet TAKE 1 TABLET BY MOUTH DAILY 90 tablet 1   ??? lisinopril (PRINIVIL;ZESTRIL) 40 MG tablet Take 40 mg by mouth daily     ??? traZODone (DESYREL) 100 MG tablet Take 1 tablet by mouth nightly 90 tablet 1   ??? ALPRAZolam (XANAX) 0.5 MG tablet Take 1 tablet by mouth 2 times daily as needed for Anxiety 60 tablet 0   ???  ondansetron (ZOFRAN) 4 MG tablet Take 1 tablet by mouth daily as needed for Nausea or Vomiting 30 tablet 1   ??? venlafaxine (EFFEXOR XR) 150 MG XR capsule Take 1 capsule by mouth daily 90 capsule 3   ??? nystatin 100000 UNIT/GM POWD APPLY 3 TIMES DAILY 30 g 4   ??? fluocinonide (LIDEX) 0.05 % cream Apply topically 2 times daily. 1 Tube 1   ??? Multiple Vitamins-Minerals (MULTIVITAMIN PO) Take by mouth     ??? CALCIUM-VITAMIN D PO Take by mouth     ??? albuterol (PROVENTIL HFA) 108 (90 BASE) MCG/ACT inhaler Inhale 2 puffs into the lungs every 6 hours as needed for Wheezing 3 Inhaler 3   ??? omeprazole (PRILOSEC) 40 MG capsule Take 1 capsule by mouth daily. 30 capsule 3   ??? cetirizine (ZYRTEC ALLERGY) 10 MG tablet Take 10 mg by mouth daily.       No current facility-administered medications for this visit.        Family History   Problem Relation Age of Onset   ??? Arthritis Mother    ??? Cancer Mother    ??? Depression Mother    ??? High Blood Pressure Mother    ??? Heart Disease Mother 33     MI    ??? Ovarian Cancer Mother 45   ??? Cancer Father 39     colon   ??? Heart Disease Maternal Grandmother    ??? Early Death Paternal Grandmother    ??? Heart Disease Paternal Grandfather    ??? Diabetes Other    ??? High Blood Pressure Other    ??? Obesity Other      Social History     Social History   ??? Marital status: Married     Spouse name: N/A   ??? Number of children: N/A   ??? Years of education: N/A     Occupational History   ??? Not on file.     Social History Main Topics   ??? Smoking status: Former Smoker     Packs/day: 0.50     Years: 40.00     Types: Cigarettes     Quit date: 08/07/2012   ??? Smokeless tobacco: Never Used      Comment: started to smoke at age 82 / only smoked 0.5 p.p.d    ??? Alcohol use No   ??? Drug use: No   ???  Sexual activity: Not on file     Other Topics Concern   ??? Not on file     Social History Narrative    ** Merged History Encounter **              Review of Systems   Constitutional: Negative.  Negative for fever, chills, diaphoresis,  activity change, appetite change, fatigue and unexpected weight change.   HENT: Negative.  Negative for hearing loss, ear pain, nosebleeds, congestion, facial swelling, rhinorrhea, sneezing, neck pain, neck stiffness, postnasal drip, sinus pressure and ear discharge.    Eyes: Negative.  Negative for photophobia, pain, discharge, redness, itching and visual disturbance.   Respiratory: As per HPI  Cardiovascular: Negative.  Negative for chest pain, palpitations and leg swelling.   Gastrointestinal: Negative.  Negative for abdominal pain, blood in stool, abdominal distention and anal bleeding.   Genitourinary: Negative.  Negative for dysuria, urgency, frequency, hematuria, decreased urine volume, enuresis and difficulty urinating.   Musculoskeletal: Negative.  Negative for myalgias, back pain, joint swelling, arthralgias and gait problem.   Skin: Negative.  Negative for color change and pallor.   Neurological: Negative.  Negative for dizziness, tremors, seizures, syncope, facial asymmetry, speech difficulty, weakness, light-headedness, numbness and headaches.   Hematological: Negative.  Negative for adenopathy.   Psychiatric/Behavioral: Negative.  Negative for hallucinations, behavioral problems, confusion and agitation. The patient is not nervous/anxious and is not hyperactive.      Blood pressure 113/73, pulse 68, resp. rate 18, height 5\' 6"  (1.676 m), weight (!) 308 lb (139.7 kg), SpO2 94 %, not currently breastfeeding.    Physical Exam   Constitutional: Oriented. Morbidly obese. No distress.   HENT:   Head: Normocephalic and atraumatic.   Mouth/Throat: Oropharynx is clear and moist. No oropharyngeal exudate.   Eyes: Conjunctivae and extraocular motions are normal. Pupils are equal, round, and reactive to light. Right eye exhibits no discharge. Left eye exhibits no discharge.   Neck: Normal range of motion. Neck supple. No JVD present. Carotid bruit is not present. No rigidity. No tracheal deviation present. No  thyromegaly present.   Cardiovascular: Normal rate, regular rhythm, S1&S2 and intact distal pulses.    Pulmonary/Chest: Effort normal and breath sounds normal. No stridor. No respiratory distress. Has no wheezes. Has no rhonchi. Has no rales. Exhibits no tenderness.   Abdominal: Soft. Bowel sounds are normal. Exhibits no shifting dullness, no distension and no mass. No tenderness. Has no rebound and no guarding.   Musculoskeletal: Normal range of motion. Exhibits no edema and no tenderness.   Lymphadenopathy:     Has no cervical adenopathy.     Has no axillary adenopathy.   Neurological: Alert and oriented. Has normal reflexes. No cranial nerve deficit. Exhibits normal muscle tone. Coordination normal.   Skin: Skin is warm and dry. No rash noted. No erythema.   Psychiatric: Has a normal mood and affect. Behavior is normal. Thought content normal.      Assessment:    1. SOB (shortness of breath)     2. OSA (obstructive sleep apnea)     3. Chronic obstructive pulmonary disease, unspecified COPD type ([place])               Plan:    1. I discussed with patient the above  2. I reviewed her CTA and explained findings  3. No clear disease on CT chest  4. I will repeat full PFT and try Anoro for possible COPD  5. Her SOB might be  only due to morbid obesity(gained 80 lbs over last year) and deconditioning  6. She is scheduled to see sleep medicine for OSA treatment  7. RTC in 1 month

## 2015-11-08 ENCOUNTER — Inpatient Hospital Stay: Admit: 2015-11-08 | Attending: Critical Care Medicine | Primary: Sports Medicine

## 2015-11-08 DIAGNOSIS — R0602 Shortness of breath: Secondary | ICD-10-CM

## 2015-11-08 MED ORDER — ALBUTEROL SULFATE HFA 108 (90 BASE) MCG/ACT IN AERS
108 (90 Base) MCG/ACT | Freq: Once | RESPIRATORY_TRACT | Status: AC
Start: 2015-11-08 — End: 2015-11-08
  Administered 2015-11-08: 20:00:00 4 via RESPIRATORY_TRACT

## 2015-11-08 NOTE — Telephone Encounter (Signed)
Patient calling with her BP Readings.      AM   PM    11/19 146/96  65  132/71   68  11/20 148/82  70  137/83   68  11/21 138/80  68  155/86   67  11/22 143/86  68  156/86   67    11/23 139/75  78  159/80   73  11/24 147/83  70  148/77   78     11/25  131/86  68  132/73   75  11/26 146/72  66  144/68   72  11/27 142/76  76   139/77   63  11/28 146/79  70  134/75   67  11/29 134/75  66  136/75   67  11/30  136/75  67  128/69   60  12/1 137/77  71  139/89   57  12/2 133/76  63

## 2015-11-08 NOTE — Telephone Encounter (Signed)
Patient states that she was get 2 prescriptions sent to the pharmacy. They are not there. Please call.

## 2015-11-08 NOTE — Procedures (Signed)
[facility]                       [place]  [address]                                PULMONARY FUNCTION TEST    PATIENT NAME:  Rachael Mays, Rachael Mays.                 DOB:      1956/11/05  MED REC NO:    [id number]                       ROOM:        ACCOUNT NO:    [id number]                       ADMISSION DATE: 11/08/2015  PHYSICIAN:     Lacretia Leigh, MD                     DATE OF STUDY:  11/08/2015    INDICATION:  COPD.    INTERPRETATION:  Spirometry showed decreased FVC of 2.59 liters, 71% of  predicted and decreased FEV1 of 2.13 liters, 75% predicted.  FEV1/FVC ratio  was normal.  No response to bronchodilators demonstrated on spirometry.  Lung  volumes showed normal total lung capacity of 88% predicted.  Diffusion  capacity showed decreased DLCO of 57% predicted.    IMPRESSION:  No obstructive or restrictive pattern seen on this pulmonary  function test with no response to bronchodilators.  There was moderate  decrease in diffusion capacity noted.  In comparison to the test that was done  a year ago the total lung capacity has increased by 12%, but DLCO has  decreased by 16%.        Lacretia Leigh, MD      D: 11/08/2015 15:49:49  T: 11/08/2015 16:35:40  FH/nts  Job#: [id number]  Doc#: [id number]

## 2015-11-09 NOTE — Telephone Encounter (Signed)
Let's try to increase the metoprolol to 150mg  bid.  If pulse running below 60 on a regular basis call me for alternate medication.  However, we would need to add another med in that case as already maxed out on current meds.  Really try to limit salt to <2g/day.

## 2015-11-11 ENCOUNTER — Ambulatory Visit
Admit: 2015-11-11 | Discharge: 2015-11-11 | Payer: PRIVATE HEALTH INSURANCE | Attending: Critical Care Medicine | Primary: Student in an Organized Health Care Education/Training Program

## 2015-11-11 DIAGNOSIS — G4710 Hypersomnia, unspecified: Secondary | ICD-10-CM

## 2015-11-11 NOTE — Telephone Encounter (Signed)
Patient notified.  Patient states that she is already taking 150mg  bid of the metoprolol.  Please advise.  Patient is coming in to see TS tomorrow.

## 2015-11-11 NOTE — Telephone Encounter (Signed)
Left message for patient to call back.

## 2015-11-11 NOTE — Progress Notes (Signed)
Rachael River MD, FAASM, FCCP  [facility] CNP [place]  [address]  [address], [place]  [address]  [place] [phone]   [place] [address]  F- [phone]       [facility]    Subjective:     Patient ID: Rachael Mays is a 59 y.o. female.    Chief Complaint   Patient presents with   ??? Snoring   ??? Daytime Sleepiness       HPI:        Rachael Mays is a 59 y.o. female referred by Acquanetta Sit SLOUGH, MD for a sleep evaluation. She complains of snoring, snorting, tossing and turning, excessive daytime sleepiness, awakening in the middle of the night because of urination, feels sleepy during the day but she denies knees buckling with laughing, completely or partially paralyzed while falling asleep or waking up.  Symptoms began >10 years ago, gradually worsening since that time.     Previous evaluation and treatment has included- none.    DOT/CDL - No  FAA/Pilot's license - No      Previous Report(s) Reviewed: historical medical records, office notes and referral letter/letters     Epworth - Total score: 14    Caffeine Intake - 1 cups of caffeinated coffee per day(s)    Social History     Social History   ??? Marital status: Married     Spouse name: N/A   ??? Number of children: N/A   ??? Years of education: N/A     Occupational History   ??? Not on file.     Social History Main Topics   ??? Smoking status: Former Smoker     Packs/day: 0.50     Years: 40.00     Types: Cigarettes     Quit date: 08/07/2012   ??? Smokeless tobacco: Never Used      Comment: started to smoke at age 79 / only smoked 0.5 p.p.d    ??? Alcohol use No   ??? Drug use: No   ??? Sexual activity: Not on file     Other Topics Concern   ??? Not on file     Social History Narrative    ** Merged History Encounter **            Prior to Admission medications    Medication Sig Start Date End Date Taking? Authorizing Provider   naproxen (NAPROSYN)  500 MG tablet Take 500 mg by mouth 2 times daily (with meals)   Yes Historical Provider, MD   cyclobenzaprine (FLEXERIL) 10 MG tablet Take 10 mg by mouth as needed for Muscle spasms   Yes Historical Provider, MD   amLODIPine (NORVASC) 10 MG tablet Take 1 tablet by mouth daily 10/28/15  Yes Harvel Quale, MD   hydrochlorothiazide (HYDRODIURIL) 25 MG tablet TAKE ONE TABLET BY MOUTH DAILY 10/25/15  Yes Harvel Quale, MD   metoclopramide (REGLAN) 5 MG tablet TAKE ONE TABLET BY MOUTH FOUR TIMES A DAY BEFORE MEALS AND AT BEDTIME 10/10/15  Yes Harvel Quale, MD   metoprolol (LOPRESSOR) 100 MG tablet Take 1.5 tablets by mouth 2 times daily 09/26/15  Yes Harvel Quale, MD   montelukast (SINGULAIR) 10 MG tablet TAKE 1 TABLET BY MOUTH DAILY 07/31/15  Yes Jamal Collin, MD   lisinopril (  PRINIVIL;ZESTRIL) 40 MG tablet Take 40 mg by mouth daily   Yes Historical Provider, MD   traZODone (DESYREL) 100 MG tablet Take 1 tablet by mouth nightly 07/04/15  Yes Harvel Quale, MD   ALPRAZolam Duanne Moron) 0.5 MG tablet Take 1 tablet by mouth 2 times daily as needed for Anxiety 06/03/15  Yes Harvel Quale, MD   ondansetron [facility]) 4 MG tablet Take 1 tablet by mouth daily as needed for Nausea or Vomiting 06/03/15  Yes Lucky Rathke Rayford Halsted, MD   venlafaxine (EFFEXOR XR) 150 MG XR capsule Take 1 capsule by mouth daily 06/03/15  Yes Harvel Quale, MD   nystatin 100000 UNIT/GM POWD APPLY 3 TIMES DAILY 04/08/15  Yes Harvel Quale, MD   fluocinonide (LIDEX) 0.05 % cream Apply topically 2 times daily. 02/11/15  Yes Harvel Quale, MD   Multiple Vitamins-Minerals (MULTIVITAMIN PO) Take by mouth   Yes Historical Provider, MD   CALCIUM-VITAMIN D PO Take by mouth   Yes Historical Provider, MD   albuterol (PROVENTIL HFA) 108 (90 BASE) MCG/ACT inhaler Inhale 2 puffs into the lungs every 6 hours as needed for Wheezing 05/17/14  Yes Harvel Quale, MD   omeprazole  (PRILOSEC) 40 MG capsule Take 1 capsule by mouth daily. 07/21/13  Yes Norwood Levo, MD   cetirizine (ZYRTEC ALLERGY) 10 MG tablet Take 10 mg by mouth daily.   Yes Historical Provider, MD       Allergies as of 11/11/2015 - Review Complete 11/11/2015   Allergen Reaction Noted   ??? Morphine Itching 05/04/2014   ??? Talwin [pentazocine] Other (See Comments) 03/29/2013       Patient Active Problem List   Diagnosis   ??? Back pain   ??? Deep vein thrombosis ([place])   ??? Depression   ??? Hyperlipidemia   ??? Pulmonary embolism ([place])   ??? Nausea & vomiting   ??? Abdominal  pain, other specified site   ??? Morbid obesity ([place])   ??? Status post gastric banding   ??? Vertigo   ??? Dizziness   ??? Tinnitus, subjective   ??? Other complications of gastric band procedure   ??? Essential hypertension   ??? Hyperglycemia   ??? Chest pain   ??? 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 Adrenal mass, left ([place])   ??? Adrenal nodule ([place])   ??? OSA (obstructive sleep apnea)   ??? Dyspnea and respiratory abnormalities   ??? Abnormal stress test       Past Medical History   Diagnosis Date   ??? Anxiety    ??? Asthma    ??? Chronic back pain    ??? Deep vein thrombosis    ??? Depression    ??? GERD (gastroesophageal reflux disease)      NO LONGER SINCE LAP   ??? GERD (gastroesophageal reflux disease) 01/23/2009   ??? Hyperlipidemia      hx; resolved with lap band   ??? Hypertension    ??? Obesity      hx of; had lap band   ??? Pulmonary embolism [facility])        Past Surgical History   Procedure Laterality Date   ??? Colonoscopy     ??? Cesarean section     ??? Cholecystectomy     ??? Hysterectomy     ??? Shoulder surgery     ??? Lap band  05/01/08     Dr. Nicole Cella   ??? Back surgery  neck  plates   ??? Colonoscopy  04/08/10   ??? Other surgical history       [place] Filter: curently in place   ??? Upper gastrointestinal endoscopy  05/19/13   ??? Other surgical history  07/05/13     lap band removal   ??? Upper gastrointestinal endoscopy  07/21/13     ESOPHAGEAL STENT PLACEMENT   ??? Partial hysterectomy     ??? Upper gastrointestinal  endoscopy N/A 07/31/2014     Esophagogastroduodenoscopy with esophageal balloon dilation   ??? Vascular surgery  04/2015     Lucendia Herrlich, celiac artery angiogram, normal abd arteries       Family History   Problem Relation Age of Onset   ??? Arthritis Mother    ??? Cancer Mother    ??? Depression Mother    ??? High Blood Pressure Mother    ??? Heart Disease Mother 58     MI    ??? Ovarian Cancer Mother 35   ??? Cancer Father 35     colon   ??? Heart Disease Maternal Grandmother    ??? Early Death Paternal Grandmother    ??? Heart Disease Paternal Grandfather    ??? Diabetes Other    ??? High Blood Pressure Other    ??? Obesity Other    ??? Other Sister      OSA   ??? Other Brother      OSA   ??? Other Brother      OSA   ??? Other Daughter      OSA       Review of Systems   Constitutional: Positive for fatigue. Negative for activity change and appetite change.   HENT: Positive for congestion. Negative for nosebleeds, postnasal drip, rhinorrhea and sneezing.    Eyes: Negative for photophobia, pain and visual disturbance.   Respiratory: Negative for choking, chest tightness and shortness of breath.    Cardiovascular: Negative.    Gastrointestinal: Negative for abdominal distention, abdominal pain, nausea and vomiting.   Endocrine: Negative for cold intolerance and heat intolerance.   Genitourinary: Positive for frequency. Negative for difficulty urinating, dysuria and urgency.   Musculoskeletal: Negative.  Negative for neck pain and neck stiffness.   Skin: Negative.    Allergic/Immunologic: Negative.    Neurological: Negative for tremors, seizures, syncope and weakness.   Hematological: Negative for adenopathy. Does not bruise/bleed easily.   Psychiatric/Behavioral: Positive for sleep disturbance. Negative for agitation, behavioral problems and confusion.       Objective:     Vitals:  Weight BMI   Wt Readings from Last 3 Encounters:   11/11/15 (!) 312 lb (141.5 kg)   11/06/15 (!) 308 lb (139.7 kg)   09/23/15 (!) 306 lb (138.8 kg)    Body mass index is 50.36  kg/(m^2).     BP HR SaO2   BP Readings from Last 3 Encounters:   11/11/15 122/74   11/06/15 113/73   09/23/15 120/70    Pulse Readings from Last 3 Encounters:   11/11/15 68   11/08/15 57   11/06/15 68    SpO2 Readings from Last 3 Encounters:   11/11/15 97%   11/08/15 94%   11/06/15 94%        Physical Exam   Constitutional: She is oriented to person, place, and time. Vital signs are normal. She appears well-developed and well-nourished.  Non-toxic appearance. She does not have a sickly appearance.   HENT:   Head: Normocephalic and atraumatic. Not macrocephalic and  not microcephalic.   Right Ear: External ear normal.   Left Ear: External ear normal.   Nose: Mucosal edema and septal deviation present.   Mouth/Throat: Uvula is midline and mucous membranes are normal. Posterior oropharyngeal edema present. No oropharyngeal exudate or tonsillar abscesses.   Tonsils:   normal size, normal appearance  Post. Pharynx:   normal mucosa  Neck circumference: 17.5  Inches   Eyes: Conjunctivae, EOM and lids are normal. Pupils are equal, round, and reactive to light.   Neck: Trachea normal and normal range of motion. Neck supple. No tracheal deviation present. No thyroid mass and no thyromegaly present.   Cardiovascular: Normal rate, regular rhythm, S1 normal, S2 normal and normal heart sounds.    Pulmonary/Chest: Effort normal and breath sounds normal. No apnea. No respiratory distress. She has no wheezes. She has no rhonchi. She has no rales.   Abdominal: Soft. Bowel sounds are normal. She exhibits no distension. There is no tenderness.   Musculoskeletal: Normal range of motion. She exhibits no edema.   Lymphadenopathy:        Head (right side): No submental, no submandibular and no tonsillar adenopathy present.        Head (left side): No submental, no submandibular and no tonsillar adenopathy present.   Neurological: She is alert and oriented to person, place, and time.   Skin: Skin is warm, dry and intact. No cyanosis. Nails  show no clubbing.   Psychiatric: She has a normal mood and affect. Her speech is normal and behavior is normal. Thought content normal.   Nursing note and vitals reviewed.      Assessment:     1. Hypersomnia  Baseline Diagnostic Sleep Study    Sleep Study with PAP Titration    New problem, needs w/u   2. Snoring  Baseline Diagnostic Sleep Study    Sleep Study with PAP Titration    New problem, needs w/u   3. Essential hypertension Stable    4. Uncomplicated asthma, unspecified asthma severity Stable    5. Depression, unspecified depression type Stable    6. Morbid obesity, unspecified obesity type ([place]) Stable      Plan:     Reviewed OSA: pathophysiology, diagnosis, complications and treatment. Instructed her not to drive if drowsy. Continue medications per her PCP and other physicians. Limit caffeine use after 3pm. Will do PSG to rule-out OSA and other sleep disorders. Given severity of his Sx and medical Hx as well has very high probability for OSA will do CPAP titration after PSG to expedite therapy for his OSA.  8 wk f/u after titration.       Orders Placed This Encounter   Procedures   ??? Baseline Diagnostic Sleep Study   ??? Sleep Study with PAP Titration       Rachael River MD, [place]a [place], [place] Director ??? [facility] [place] [place]  Interim Director ??? [facility]

## 2015-11-12 ENCOUNTER — Ambulatory Visit
Admit: 2015-11-12 | Discharge: 2015-11-12 | Payer: PRIVATE HEALTH INSURANCE | Attending: Family Medicine | Primary: Student in an Organized Health Care Education/Training Program

## 2015-11-12 DIAGNOSIS — I10 Essential (primary) hypertension: Secondary | ICD-10-CM

## 2015-11-12 MED ORDER — HYDRALAZINE HCL 25 MG PO TABS
25 MG | ORAL_TABLET | Freq: Three times a day (TID) | ORAL | 3 refills | Status: DC
Start: 2015-11-12 — End: 2016-06-17

## 2015-11-12 MED ORDER — HYDRALAZINE HCL 25 MG PO TABS
25 MG | ORAL_TABLET | Freq: Three times a day (TID) | ORAL | 3 refills | Status: DC
Start: 2015-11-12 — End: 2015-11-12

## 2015-11-12 NOTE — Progress Notes (Signed)
Subjective:      Patient ID: Rachael Mays is a 59 y.o. female.    HPI   Weight up 6# in a week per patient.  Swelling in feet, ankles, and hands.  No dyspnea or cough.  Limiting salt in diet and eating more healthy meals as the whole family is trying to lose weight.  Recording home bps and these are up (see phone note from 11/11/2015).    Review of Systems    Patient Active Problem List   Diagnosis   ??? Back pain   ??? Deep vein thrombosis ([place])   ??? Depression   ??? Hyperlipidemia   ??? Pulmonary embolism ([place])   ??? Nausea & vomiting   ??? Abdominal  pain, other specified site   ??? Morbid obesity ([place])   ??? Status post gastric banding   ??? Vertigo   ??? Dizziness   ??? Tinnitus, subjective   ??? Other complications of gastric band procedure   ??? Essential hypertension   ??? Hyperglycemia   ??? Chest pain   ??? 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 Adrenal mass, left ([place])   ??? Adrenal nodule ([place])   ??? OSA (obstructive sleep apnea)   ??? Dyspnea and respiratory abnormalities   ??? Abnormal stress test       Outpatient Prescriptions Marked as Taking for the 11/12/15 encounter (Office Visit) with Harvel Quale, MD   Medication Sig Dispense Refill   ??? cyclobenzaprine (FLEXERIL) 10 MG tablet Take 10 mg by mouth as needed for Muscle spasms     ??? amLODIPine (NORVASC) 10 MG tablet Take 1 tablet by mouth daily 90 tablet 3   ??? hydrochlorothiazide (HYDRODIURIL) 25 MG tablet TAKE ONE TABLET BY MOUTH DAILY 90 tablet 1   ??? metoclopramide (REGLAN) 5 MG tablet TAKE ONE TABLET BY MOUTH FOUR TIMES A DAY BEFORE MEALS AND AT BEDTIME 120 tablet 0   ??? metoprolol (LOPRESSOR) 100 MG tablet Take 1.5 tablets by mouth 2 times daily 270 tablet 0   ??? montelukast (SINGULAIR) 10 MG tablet TAKE 1 TABLET BY MOUTH DAILY 90 tablet 1   ??? lisinopril (PRINIVIL;ZESTRIL) 40 MG tablet Take 40 mg by mouth daily     ??? traZODone (DESYREL) 100 MG tablet Take 1 tablet by mouth nightly 90 tablet 1   ??? ALPRAZolam (XANAX) 0.5 MG tablet Take 1 tablet by mouth 2 times daily as needed for  Anxiety 60 tablet 0   ??? ondansetron (ZOFRAN) 4 MG tablet Take 1 tablet by mouth daily as needed for Nausea or Vomiting 30 tablet 1   ??? venlafaxine (EFFEXOR XR) 150 MG XR capsule Take 1 capsule by mouth daily 90 capsule 3   ??? nystatin 100000 UNIT/GM POWD APPLY 3 TIMES DAILY 30 g 4   ??? fluocinonide (LIDEX) 0.05 % cream Apply topically 2 times daily. 1 Tube 1   ??? Multiple Vitamins-Minerals (MULTIVITAMIN PO) Take by mouth     ??? CALCIUM-VITAMIN D PO Take by mouth     ??? omeprazole (PRILOSEC) 40 MG capsule Take 1 capsule by mouth daily. 30 capsule 3   ??? cetirizine (ZYRTEC ALLERGY) 10 MG tablet Take 10 mg by mouth daily.         Allergies   Allergen Reactions   ??? Morphine Itching   ??? Talwin [Pentazocine] Other (See Comments)     dizzy       Social History   Substance Use Topics   ??? Smoking status: Former Smoker  Packs/day: 0.50     Years: 40.00     Types: Cigarettes     Quit date: 08/07/2012   ??? Smokeless tobacco: Never Used      Comment: started to smoke at age 58 / only smoked 0.5 p.p.d    ??? Alcohol use No       Objective:     Visit Vitals   ??? BP 130/72 (Site: Right Arm, Position: Sitting, Cuff Size: Large Adult)   ??? Pulse 65   ??? Wt (!) 311 lb (141.1 kg)   ??? SpO2 97%   ??? BMI 50.2 kg/m2       Physical Exam   Constitutional: She is oriented to person, place, and time. She appears well-developed and well-nourished. She appears distressed.   Neck: Neck supple. No thyromegaly present.   Cardiovascular: Normal rate, regular rhythm and normal heart sounds.    No murmur heard.  2+ pitting edema bilateral lower extremities   Pulmonary/Chest: Effort normal and breath sounds normal. She has no wheezes. She has no rales.   Neurological: She is alert and oriented to person, place, and time.   Psychiatric: She has a normal mood and affect. Her behavior is normal.       Assessment:     1. Essential hypertension  hydrALAZINE (APRESOLINE) 25 MG tablet    DISCONTINUED: hydrALAZINE (APRESOLINE) 25 MG tablet   2. Edema, unspecified type        Plan:     Suspect that edema is related to amlodipine.  Echo 09/2015 normal.  D/c amlodipine.  Add hydralazine 25mg  tid for improved control of bp.  May double the HCTZ for the next couple of days to help with diuresis.  Call with bp log 1 week.

## 2015-11-12 NOTE — Progress Notes (Signed)
Patient complains of swelling in legs, feet, and hands for four days.  Patient has eliminate salt from diet.  Patient is not currently taking any OTC medications.

## 2015-11-14 NOTE — Telephone Encounter (Signed)
I spoke with the pt and she said that the Anoro did help her - pt given samples to hold her until her follow up appt - pt will call her insurance co to see which inhaler is covered

## 2015-11-15 NOTE — Telephone Encounter (Signed)
Patient is informed and message is being forward to Dr. Murrell Converse.  Please advise

## 2015-11-15 NOTE — Telephone Encounter (Signed)
Continue with the changes in meds TS made  I am not sure what TS was doing with the pulse, 53 is probably ok, forward to TS about that

## 2015-11-15 NOTE — Telephone Encounter (Signed)
Patient calling  Her pulse reading today was 53  She was told to call in and report to TS when pulse was below 60.  Patient also stating that her ankles are still swollen, but a little smaller than a few days ago.  Please advise.

## 2015-11-15 NOTE — Telephone Encounter (Signed)
Please advise

## 2015-11-18 NOTE — Telephone Encounter (Signed)
lmtc

## 2015-11-18 NOTE — Telephone Encounter (Signed)
Okay as long as not light headed or dizzy.

## 2015-11-18 NOTE — Telephone Encounter (Signed)
Patient calling back to give cell number to contact her.  [phone].

## 2015-11-18 NOTE — Telephone Encounter (Signed)
Need some more readings for pulse.  Keep a log for a couple of days.  If still low, will need to decease metoprolol.

## 2015-11-18 NOTE — Telephone Encounter (Signed)
Patient is informed and states that she has been dizzy since her pulse has been low.  Patient wants to know the next step.  Please advise

## 2015-11-19 NOTE — Care Coordination-Inpatient (Signed)
Sent letter for engagement

## 2015-11-19 NOTE — Telephone Encounter (Signed)
Notified pt

## 2015-11-21 ENCOUNTER — Encounter

## 2015-11-22 MED ORDER — FLUOCINONIDE 0.05 % EX CREA
0.05 % | CUTANEOUS | 1 refills | Status: DC
Start: 2015-11-22 — End: 2016-03-23

## 2015-11-26 NOTE — Telephone Encounter (Signed)
Patient called with the following symptoms of stopped up nose, sore throat and cold symptoms for 2 days.  No available appointment for today as patient requested at the time of the call with Dr. Murrell Converse or Ebony Hail.   Patient would like a prescription called to her local pharmacy   Please call the patient if you need anything further.   [address]  [phone]

## 2015-11-26 NOTE — Telephone Encounter (Signed)
Needs an appointment, please call pt to schedule.

## 2015-11-27 ENCOUNTER — Encounter: Attending: Family Medicine | Primary: Student in an Organized Health Care Education/Training Program

## 2015-11-28 ENCOUNTER — Ambulatory Visit
Admit: 2015-11-28 | Discharge: 2015-11-28 | Payer: PRIVATE HEALTH INSURANCE | Attending: Family Medicine | Primary: Student in an Organized Health Care Education/Training Program

## 2015-11-28 DIAGNOSIS — J449 Chronic obstructive pulmonary disease, unspecified: Secondary | ICD-10-CM

## 2015-11-28 MED ORDER — PREDNISONE 20 MG PO TABS
20 MG | ORAL_TABLET | ORAL | 0 refills | Status: DC
Start: 2015-11-28 — End: 2015-12-19

## 2015-11-28 MED ORDER — AZITHROMYCIN 250 MG PO TABS
250 MG | PACK | ORAL | 0 refills | Status: AC
Start: 2015-11-28 — End: 2015-12-08

## 2015-11-28 NOTE — Progress Notes (Signed)
Subjective:      Patient ID: Rachael Mays is a 59 y.o. female.    HPI     Four day history of cough, congestion, shortness of breath, runny nose, and chest pain with cough.  No OTC meds tried.  Hx of COPD possibly and follows with Dr. Jeb Levering.  On anoro and Singulair.  Using albuterol currently once or twice a day.      Review of Systems    Patient Active Problem List   Diagnosis   ??? Back pain   ??? Deep vein thrombosis ([place])   ??? Depression   ??? Hyperlipidemia   ??? Pulmonary embolism ([place])   ??? Nausea & vomiting   ??? Abdominal  pain, other specified site   ??? Morbid obesity ([place])   ??? Status post gastric banding   ??? Vertigo   ??? Dizziness   ??? Tinnitus, subjective   ??? Other complications of gastric band procedure   ??? Essential hypertension   ??? Hyperglycemia   ??? Chest pain   ??? 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 Adrenal mass, left ([place])   ??? Adrenal nodule ([place])   ??? OSA (obstructive sleep apnea)   ??? Dyspnea and respiratory abnormalities   ??? Abnormal stress test       Outpatient Prescriptions Marked as Taking for the 11/28/15 encounter (Office Visit) with Harvel Quale, MD   Medication Sig Dispense Refill   ??? pregabalin (LYRICA) 50 MG capsule Take 50 mg by mouth 2 times daily     ??? Umeclidinium-Vilanterol (ANORO ELLIPTA IN) Inhale into the lungs     ??? predniSONE (DELTASONE) 20 MG tablet 60mg  daily x 2 days, then 40mg  daily x 2 days, then 20mg  daily x 2 days, then 10mg  daily x 2 days 13 tablet 0   ??? azithromycin (ZITHROMAX) 250 MG tablet Take 2 tabs (500 mg) on Day 1, and take 1 tab (250 mg) on days 2 through 5. 1 packet 0   ??? fluocinonide (LIDEX) 0.05 % cream APPLY TOPICALLY TWICE A DAY TO AFFECTED AREA 15 g 1   ??? hydrALAZINE (APRESOLINE) 25 MG tablet Take 1 tablet by mouth 3 times daily 270 tablet 3   ??? cyclobenzaprine (FLEXERIL) 10 MG tablet Take 10 mg by mouth as needed for Muscle spasms     ??? hydrochlorothiazide (HYDRODIURIL) 25 MG tablet TAKE ONE TABLET BY MOUTH DAILY 90 tablet 1   ??? metoclopramide (REGLAN) 5 MG  tablet TAKE ONE TABLET BY MOUTH FOUR TIMES A DAY BEFORE MEALS AND AT BEDTIME 120 tablet 0   ??? metoprolol (LOPRESSOR) 100 MG tablet Take 1.5 tablets by mouth 2 times daily 270 tablet 0   ??? montelukast (SINGULAIR) 10 MG tablet TAKE 1 TABLET BY MOUTH DAILY 90 tablet 1   ??? lisinopril (PRINIVIL;ZESTRIL) 40 MG tablet Take 40 mg by mouth daily     ??? traZODone (DESYREL) 100 MG tablet Take 1 tablet by mouth nightly 90 tablet 1   ??? ALPRAZolam (XANAX) 0.5 MG tablet Take 1 tablet by mouth 2 times daily as needed for Anxiety 60 tablet 0   ??? ondansetron (ZOFRAN) 4 MG tablet Take 1 tablet by mouth daily as needed for Nausea or Vomiting 30 tablet 1   ??? venlafaxine (EFFEXOR XR) 150 MG XR capsule Take 1 capsule by mouth daily 90 capsule 3   ??? nystatin 100000 UNIT/GM POWD APPLY 3 TIMES DAILY 30 g 4   ??? Multiple Vitamins-Minerals (MULTIVITAMIN PO) Take by mouth     ???  CALCIUM-VITAMIN D PO Take by mouth     ??? albuterol (PROVENTIL HFA) 108 (90 BASE) MCG/ACT inhaler Inhale 2 puffs into the lungs every 6 hours as needed for Wheezing 3 Inhaler 3   ??? omeprazole (PRILOSEC) 40 MG capsule Take 1 capsule by mouth daily. 30 capsule 3   ??? cetirizine (ZYRTEC ALLERGY) 10 MG tablet Take 10 mg by mouth daily.         Allergies   Allergen Reactions   ??? Morphine Itching   ??? Talwin [Pentazocine] Other (See Comments)     dizzy       Social History   Substance Use Topics   ??? Smoking status: Former Smoker     Packs/day: 0.50     Years: 40.00     Types: Cigarettes     Quit date: 08/07/2012   ??? Smokeless tobacco: Never Used      Comment: started to smoke at age 53 / only smoked 0.5 p.p.d    ??? Alcohol use No       Objective:     Visit Vitals   ??? BP 124/70 (Site: Right Arm, Position: Sitting, Cuff Size: Large Adult)   ??? Pulse 63   ??? Temp 99.2 ??F (37.3 ??C) (Temporal)   ??? Wt (!) 309 lb 3.2 oz (140.3 kg)   ??? SpO2 98%   ??? BMI 49.91 kg/m2       Physical Exam   Constitutional: She is oriented to person, place, and time. She appears well-developed and well-nourished. No  distress.   HENT:   Right Ear: Tympanic membrane and external ear normal.   Left Ear: Tympanic membrane and external ear normal.   Mouth/Throat: Oropharynx is clear and moist. No oropharyngeal exudate or posterior oropharyngeal erythema.   Cardiovascular: Normal rate, regular rhythm and normal heart sounds.    No murmur heard.  Pulmonary/Chest: Effort normal. Respiratory distress: faint expiratory wheezes at bases bilaterally. She has wheezes. She has no rales.   Neurological: She is alert and oriented to person, place, and time.   Psychiatric: She has a normal mood and affect. Her behavior is normal.       Assessment:     1. Chronic obstructive pulmonary disease, unspecified COPD type ([place])     2. Bronchitis  predniSONE (DELTASONE) 20 MG tablet    azithromycin (ZITHROMAX) 250 MG tablet       Plan:     prednisone taper over 8 days.  Zpack.  Can use albuterol 4x daily while sick.

## 2015-11-28 NOTE — Progress Notes (Signed)
Patient complains of URI symptoms since Monday.  Associated symptoms are cough, congestion, hot flashes, headaches, shortness of breath, sore throat, rhinorrhea, chest pain, diarrhea,and nausea.  Patient is not currently taking any OTC medications.

## 2015-11-28 NOTE — Care Coordination-Inpatient (Signed)
LM to inquire if [facility] engagement.

## 2015-12-03 ENCOUNTER — Inpatient Hospital Stay
Admit: 2015-12-04 | Discharge: 2015-12-16 | Payer: PRIVATE HEALTH INSURANCE | Attending: Critical Care Medicine | Primary: Student in an Organized Health Care Education/Training Program

## 2015-12-03 DIAGNOSIS — G4710 Hypersomnia, unspecified: Secondary | ICD-10-CM

## 2015-12-05 ENCOUNTER — Inpatient Hospital Stay
Admit: 2015-12-06 | Discharge: 2015-12-16 | Payer: PRIVATE HEALTH INSURANCE | Attending: Critical Care Medicine | Primary: Student in an Organized Health Care Education/Training Program

## 2015-12-05 DIAGNOSIS — G4710 Hypersomnia, unspecified: Secondary | ICD-10-CM

## 2015-12-13 ENCOUNTER — Ambulatory Visit
Admit: 2015-12-13 | Discharge: 2015-12-13 | Payer: PRIVATE HEALTH INSURANCE | Attending: Critical Care Medicine | Primary: Student in an Organized Health Care Education/Training Program

## 2015-12-13 DIAGNOSIS — R0602 Shortness of breath: Secondary | ICD-10-CM

## 2015-12-13 NOTE — Progress Notes (Signed)
Rachael Mays is 60 y.o. female here for F/U visit, SOB. She was seen for Pulmonary Medicine consultation referred by Dr. Rayford Halsted for evaluation of SOB  Patient has hx of PE over 30 years ago while on birth control pills  She failed lap band and gained 80 lbs over last year  She has been complaining of SOB  She was admitted last month for chest pain as well and angiogram was negative  Her CTA was read as:  EXAMINATION:   CTA OF THE CHEST 09/17/2015 11:14 pm   ??   TECHNIQUE:   CTA of the chest was performed after the administration of intravenous   contrast. ??Multiplanar reformatted images are provided for review. ??MIP   images are provided for review. Dose modulation, iterative reconstruction,   and/or weight based adjustment of the mA/kV was utilized to reduce the   radiation dose to as low as reasonably achievable.   ??   COMPARISON:   05/03/2015   ??   HISTORY:   ORDERING SYSTEM PROVIDED HISTORY: CHEST PAIN, ACUTE, PULMONARY EMBOLISM   SUSPECTED   TECHNOLOGIST PROVIDED HISTORY:   If patient is on cardiac monitor and/or pulse ox, they may be taken off   cardiac monitor and pulse ox, left on O2 if currently on. All monitors   reattached when patient returns to room.   Ordering Physician Provided Reason for Exam: chest pain   CHEST PAIN, ACUTE, PULMONARY EMBOLISM SUSPECTED   Acuity: Acute   Type of Exam: Initial   Relevant Medical/Surgical History: pt presents with c/o sharp pain with deep   breaths and states she cannot get a deep breath. pt was seen here for   samething on the 09/12/15--dx with thoracic back pain. pain is 8/10.   ??   FINDINGS:   Pulmonary Arteries: Pulmonary arteries are adequately opacified for   evaluation. ??No evidence of intraluminal filling defect to suggest pulmonary   embolism. ??Main pulmonary artery is normal in caliber.   ??   Mediastinum: No evidence of mediastinal lymphadenopathy. ??The heart and   pericardium demonstrate no acute abnormality. ??There is no acute abnormality   of the  thoracic aorta.   ??   Lungs/pleura: The lungs are without acute process. ??No focal consolidation or   pulmonary edema. ??No evidence of pleural effusion or pneumothorax.   ??   Upper Abdomen: Small right adrenal nodule again seen measuring approximately   1 cm. ??There is a small splenule.   ??   Soft Tissues/Bones: No acute bone or soft tissue abnormality.   ??   ??   Impression   No evidence of pulmonary embolism or acute pulmonary abnormality     She smoked for 40 years and quitted in 2013  Her last PFT 11-2014 showed no clear obstructive changes  Repeat PFT showed no obstructive changes as well but decreased DLCO    Past Medical History   Diagnosis Date   ??? Anxiety    ??? Asthma    ??? Chronic back pain    ??? Deep vein thrombosis    ??? Depression    ??? GERD (gastroesophageal reflux disease)      NO LONGER SINCE LAP   ??? GERD (gastroesophageal reflux disease) 01/23/2009   ??? Hyperlipidemia      hx; resolved with lap band   ??? Hypertension    ??? Obesity      hx of; had lap band   ??? Pulmonary embolism ([place])  Current Outpatient Prescriptions   Medication Sig Dispense Refill   ??? pregabalin (LYRICA) 50 MG capsule Take 50 mg by mouth 2 times daily     ??? Umeclidinium-Vilanterol (ANORO ELLIPTA IN) Inhale into the lungs     ??? predniSONE (DELTASONE) 20 MG tablet 60mg  daily x 2 days, then 40mg  daily x 2 days, then 20mg  daily x 2 days, then 10mg  daily x 2 days 13 tablet 0   ??? fluocinonide (LIDEX) 0.05 % cream APPLY TOPICALLY TWICE A DAY TO AFFECTED AREA 15 g 1   ??? hydrALAZINE (APRESOLINE) 25 MG tablet Take 1 tablet by mouth 3 times daily 270 tablet 3   ??? cyclobenzaprine (FLEXERIL) 10 MG tablet Take 10 mg by mouth as needed for Muscle spasms     ??? hydrochlorothiazide (HYDRODIURIL) 25 MG tablet TAKE ONE TABLET BY MOUTH DAILY 90 tablet 1   ??? metoclopramide (REGLAN) 5 MG tablet TAKE ONE TABLET BY MOUTH FOUR TIMES A DAY BEFORE MEALS AND AT BEDTIME 120 tablet 0   ??? metoprolol (LOPRESSOR) 100 MG tablet Take 1.5 tablets by mouth 2 times daily 270  tablet 0   ??? montelukast (SINGULAIR) 10 MG tablet TAKE 1 TABLET BY MOUTH DAILY 90 tablet 1   ??? lisinopril (PRINIVIL;ZESTRIL) 40 MG tablet Take 40 mg by mouth daily     ??? traZODone (DESYREL) 100 MG tablet Take 1 tablet by mouth nightly 90 tablet 1   ??? ALPRAZolam (XANAX) 0.5 MG tablet Take 1 tablet by mouth 2 times daily as needed for Anxiety 60 tablet 0   ??? ondansetron (ZOFRAN) 4 MG tablet Take 1 tablet by mouth daily as needed for Nausea or Vomiting 30 tablet 1   ??? venlafaxine (EFFEXOR XR) 150 MG XR capsule Take 1 capsule by mouth daily 90 capsule 3   ??? nystatin 100000 UNIT/GM POWD APPLY 3 TIMES DAILY 30 g 4   ??? Multiple Vitamins-Minerals (MULTIVITAMIN PO) Take by mouth     ??? CALCIUM-VITAMIN D PO Take by mouth     ??? albuterol (PROVENTIL HFA) 108 (90 BASE) MCG/ACT inhaler Inhale 2 puffs into the lungs every 6 hours as needed for Wheezing 3 Inhaler 3   ??? omeprazole (PRILOSEC) 40 MG capsule Take 1 capsule by mouth daily. 30 capsule 3   ??? cetirizine (ZYRTEC ALLERGY) 10 MG tablet Take 10 mg by mouth daily.       No current facility-administered medications for this visit.        Family History   Problem Relation Age of Onset   ??? Arthritis Mother    ??? Cancer Mother    ??? Depression Mother    ??? High Blood Pressure Mother    ??? Heart Disease Mother 66     MI    ??? Ovarian Cancer Mother 75   ??? Cancer Father 69     colon   ??? Heart Disease Maternal Grandmother    ??? Early Death Paternal Grandmother    ??? Heart Disease Paternal Grandfather    ??? Diabetes Other    ??? High Blood Pressure Other    ??? Obesity Other    ??? Other Sister      OSA   ??? Other Brother      OSA   ??? Other Brother      OSA   ??? Other Daughter      OSA     Social History     Social History   ??? Marital status: Married  Spouse name: N/A   ??? Number of children: N/A   ??? Years of education: N/A     Occupational History   ??? Not on file.     Social History Main Topics   ??? Smoking status: Former Smoker     Packs/day: 0.50     Years: 40.00     Types: Cigarettes     Quit date:  08/07/2012   ??? Smokeless tobacco: Never Used      Comment: started to smoke at age 81 / only smoked 0.5 p.p.d    ??? Alcohol use No   ??? Drug use: No   ??? Sexual activity: Not on file     Other Topics Concern   ??? Not on file     Social History Narrative    ** Merged History Encounter **              Review of Systems   Constitutional: Negative.  Negative for fever, chills, diaphoresis, activity change, appetite change, fatigue and unexpected weight change.   HENT: Negative.  Negative for hearing loss, ear pain, nosebleeds, congestion, facial swelling, rhinorrhea, sneezing, neck pain, neck stiffness, postnasal drip, sinus pressure and ear discharge.    Eyes: Negative.  Negative for photophobia, pain, discharge, redness, itching and visual disturbance.   Respiratory: As per HPI  Cardiovascular: Negative.  Negative for chest pain, palpitations and leg swelling.   Gastrointestinal: Negative.  Negative for abdominal pain, blood in stool, abdominal distention and anal bleeding.   Genitourinary: Negative.  Negative for dysuria, urgency, frequency, hematuria, decreased urine volume, enuresis and difficulty urinating.   Musculoskeletal: Negative.  Negative for myalgias, back pain, joint swelling, arthralgias and gait problem.   Skin: Negative.  Negative for color change and pallor.   Neurological: Negative.  Negative for dizziness, tremors, seizures, syncope, facial asymmetry, speech difficulty, weakness, light-headedness, numbness and headaches.   Hematological: Negative.  Negative for adenopathy.   Psychiatric/Behavioral: Negative.  Negative for hallucinations, behavioral problems, confusion and agitation. The patient is not nervous/anxious and is not hyperactive.      Blood pressure 129/61, pulse 64, weight (!) 312 lb (141.5 kg), not currently breastfeeding.    Physical Exam   Constitutional: Oriented. Morbidly obese. No distress.   HENT:   Head: Normocephalic and atraumatic.   Mouth/Throat: Oropharynx is clear and moist. No  oropharyngeal exudate.   Eyes: Conjunctivae and extraocular motions are normal. Pupils are equal, round, and reactive to light. Right eye exhibits no discharge. Left eye exhibits no discharge.   Neck: Normal range of motion. Neck supple. No JVD present. Carotid bruit is not present. No rigidity. No tracheal deviation present. No thyromegaly present.   Cardiovascular: Normal rate, regular rhythm, S1&S2 and intact distal pulses.    Pulmonary/Chest: Effort normal and breath sounds normal. No stridor. No respiratory distress. Has no wheezes. Has no rhonchi. Has no rales. Exhibits no tenderness.   Abdominal: Soft. Bowel sounds are normal. Exhibits no shifting dullness, no distension and no mass. No tenderness. Has no rebound and no guarding.   Musculoskeletal: Normal range of motion. Exhibits no edema and no tenderness.   Lymphadenopathy:     Has no cervical adenopathy.     Has no axillary adenopathy.   Neurological: Alert and oriented. Has normal reflexes. No cranial nerve deficit. Exhibits normal muscle tone. Coordination normal.   Skin: Skin is warm and dry. No rash noted. No erythema.   Psychiatric: Has a normal mood and affect. Behavior is normal. Thought content  normal.      Assessment:    1. SOB (shortness of breath)               Plan:    1. I discussed with patient the above  2. I reviewed her PFT and explained results  3. No clear pulmonary cause for her SOB, with her previous hx of PE and decreased DLCO on recent PFT I will order V/Q scan to complete work up  4. She did not respond well to bronchodilators  5. Her SOB might be only due to morbid obesity(gained 80 lbs over last year) and deconditioning  6. She is scheduled to start PAP treatment for OSA  7. RTC if needed unless V/Q scan is abnormal

## 2015-12-13 NOTE — Communication Body (Signed)
Assessment:    Assessment:    1. SOB (shortness of breath)             Plan:    Plan:    1. I discussed with patient the above  2. I reviewed her PFT and explained results  3. No clear pulmonary cause for her SOB, with her previous hx of PE and decreased DLCO on recent PFT I will order V/Q scan to complete work up  4. She did not respond well to bronchodilators  5. Her SOB might be only due to morbid obesity(gained 80 lbs over last year) and deconditioning  6. She is scheduled to start PAP treatment for OSA  7. RTC if needed unless V/Q scan is abnormal

## 2015-12-15 ENCOUNTER — Emergency Department: Admit: 2015-12-16 | Primary: Student in an Organized Health Care Education/Training Program

## 2015-12-15 ENCOUNTER — Encounter: Admit: 2015-12-16 | Primary: Student in an Organized Health Care Education/Training Program

## 2015-12-15 DIAGNOSIS — R079 Chest pain, unspecified: Secondary | ICD-10-CM

## 2015-12-15 NOTE — ED Notes (Signed)
After x2 SL nitro patient currently denies pain but reports starting to get headache. Spouse currently at bedside, updated Dr. Tamala Julian on patient's status, patient on monitor with b/p cuff set to cycle q30 minutes, continuous pulse ox, RR even and unlabored, skin warm and dry to touch, denies any needs at this time, will continue to monitor.      Nichola Sizer, RN  12/15/15 [phone]

## 2015-12-15 NOTE — ED Provider Notes (Signed)
Triage Chief Complaint:   Chest Pain (Pt states she has some swelling in lower legs and left hand for " a while now" Pt adds she feels SOB and this afternoon she has developed some CP. )    HOPI:  Rachael Mays is a 60 y.o. female that presents to the emergency department for evaluation of some edema along with shortness of breath and chest pain.  Patient tells me she just B in the lazy today was in bed all day that about 4 in the afternoon when she got up she noticed she U [place] had swelling in her legs.  She tells me this will just, comes and goes but usually comes to the days she's up on them.  When she woke up she realized she follow short of breath though she denies any coughing fever or chills.  She continued to feel short of breath since about 4 this afternoon.  She tells me she then got out of bed and realized that she also had some chest pain she describes more of a sharp pain on the left lateral chest that goes into her back, armpit area.  She does tell me it's been somewhat of a constant pain..    Patient does tell me she's had a history of blood clots in quite some time and was felt it was related to birth control.  She is not on any birth control this time.    ROS:  At least 10 systems reviewed and otherwise acutely negative except as in the [place].    Past Medical History   Diagnosis Date   ??? Anxiety    ??? Asthma    ??? Chronic back pain    ??? Deep vein thrombosis    ??? Depression    ??? GERD (gastroesophageal reflux disease)      NO LONGER SINCE LAP   ??? GERD (gastroesophageal reflux disease) 01/23/2009   ??? Hyperlipidemia      hx; resolved with lap band   ??? Hypertension    ??? Obesity      hx of; had lap band   ??? Pulmonary embolism [facility])      Past Surgical History   Procedure Laterality Date   ??? Colonoscopy     ??? Cesarean section     ??? Cholecystectomy     ??? Hysterectomy     ??? Shoulder surgery     ??? Lap band  05/01/08     Dr. Nicole Cella   ??? Back surgery       neck  plates   ??? Colonoscopy  04/08/10   ??? Other surgical  history       [place] Filter: curently in place   ??? Upper gastrointestinal endoscopy  05/19/13   ??? Other surgical history  07/05/13     lap band removal   ??? Upper gastrointestinal endoscopy  07/21/13     ESOPHAGEAL STENT PLACEMENT   ??? Partial hysterectomy     ??? Upper gastrointestinal endoscopy N/A 07/31/2014     Esophagogastroduodenoscopy with esophageal balloon dilation   ??? Vascular surgery  04/2015     Lucendia Herrlich, celiac artery angiogram, normal abd arteries     Family History   Problem Relation Age of Onset   ??? Arthritis Mother    ??? Cancer Mother    ??? Depression Mother    ??? High Blood Pressure Mother    ??? Heart Disease Mother 79     MI    ??? Ovarian Cancer  Mother 59   ??? Cancer Father 77     colon   ??? Heart Disease Maternal Grandmother    ??? Early Death Paternal Grandmother    ??? Heart Disease Paternal Grandfather    ??? Diabetes Other    ??? High Blood Pressure Other    ??? Obesity Other    ??? Other Sister      OSA   ??? Other Brother      OSA   ??? Other Brother      OSA   ??? Other Daughter      OSA     Social History     Social History   ??? Marital status: Married     Spouse name: N/A   ??? Number of children: N/A   ??? Years of education: N/A     Occupational History   ??? Not on file.     Social History Main Topics   ??? Smoking status: Former Smoker     Packs/day: 0.50     Years: 40.00     Types: Cigarettes     Quit date: 08/07/2012   ??? Smokeless tobacco: Never Used      Comment: started to smoke at age 50 / only smoked 0.5 p.p.d    ??? Alcohol use 0.0 oz/week     0 Standard drinks or equivalent per week      Comment: occasionally    ??? Drug use: No   ??? Sexual activity: Not on file     Other Topics Concern   ??? Not on file     Social History Narrative    ** Merged History Encounter **          No current facility-administered medications for this encounter.      Current Outpatient Prescriptions   Medication Sig Dispense Refill   ??? HYDROcodone-acetaminophen (NORCO) 5-325 MG per tablet Take 1 tablet by mouth 4 times daily as needed (pain) . 15  tablet 0   ??? pregabalin (LYRICA) 50 MG capsule Take 50 mg by mouth 2 times daily     ??? Umeclidinium-Vilanterol (ANORO ELLIPTA IN) Inhale into the lungs     ??? predniSONE (DELTASONE) 20 MG tablet 60mg  daily x 2 days, then 40mg  daily x 2 days, then 20mg  daily x 2 days, then 10mg  daily x 2 days 13 tablet 0   ??? fluocinonide (LIDEX) 0.05 % cream APPLY TOPICALLY TWICE A DAY TO AFFECTED AREA 15 g 1   ??? hydrALAZINE (APRESOLINE) 25 MG tablet Take 1 tablet by mouth 3 times daily 270 tablet 3   ??? cyclobenzaprine (FLEXERIL) 10 MG tablet Take 10 mg by mouth as needed for Muscle spasms     ??? hydrochlorothiazide (HYDRODIURIL) 25 MG tablet TAKE ONE TABLET BY MOUTH DAILY 90 tablet 1   ??? metoclopramide (REGLAN) 5 MG tablet TAKE ONE TABLET BY MOUTH FOUR TIMES A DAY BEFORE MEALS AND AT BEDTIME 120 tablet 0   ??? metoprolol (LOPRESSOR) 100 MG tablet Take 1.5 tablets by mouth 2 times daily 270 tablet 0   ??? montelukast (SINGULAIR) 10 MG tablet TAKE 1 TABLET BY MOUTH DAILY 90 tablet 1   ??? lisinopril (PRINIVIL;ZESTRIL) 40 MG tablet Take 40 mg by mouth daily     ??? traZODone (DESYREL) 100 MG tablet Take 1 tablet by mouth nightly 90 tablet 1   ??? ALPRAZolam (XANAX) 0.5 MG tablet Take 1 tablet by mouth 2 times daily as needed for Anxiety 60 tablet 0   ???  ondansetron (ZOFRAN) 4 MG tablet Take 1 tablet by mouth daily as needed for Nausea or Vomiting 30 tablet 1   ??? venlafaxine (EFFEXOR XR) 150 MG XR capsule Take 1 capsule by mouth daily 90 capsule 3   ??? nystatin 100000 UNIT/GM POWD APPLY 3 TIMES DAILY 30 g 4   ??? Multiple Vitamins-Minerals (MULTIVITAMIN PO) Take by mouth     ??? CALCIUM-VITAMIN D PO Take by mouth     ??? albuterol (PROVENTIL HFA) 108 (90 BASE) MCG/ACT inhaler Inhale 2 puffs into the lungs every 6 hours as needed for Wheezing 3 Inhaler 3   ??? omeprazole (PRILOSEC) 40 MG capsule Take 1 capsule by mouth daily. 30 capsule 3   ??? cetirizine (ZYRTEC ALLERGY) 10 MG tablet Take 10 mg by mouth daily.       Allergies   Allergen Reactions   ??? Morphine  Itching   ??? Talwin [Pentazocine] Other (See Comments)     dizzy       Nursing Notes Reviewed    Physical Exam:  ED Triage Vitals   Enc Vitals Group      BP 12/15/15 2029 132/77      Pulse 12/15/15 2029 69      Resp 12/15/15 2029 16      Temp 12/15/15 2029 98.2 ??F (36.8 ??C)      Temp Source 12/15/15 2029 Oral      SpO2 12/15/15 2029 96 %      Weight 12/15/15 2027 305 lb (138.3 kg)      Height 12/15/15 2027 5\' 6"  (1.676 m)      Head Cir --       Peak Flow --       Pain Score --       Pain Loc --       Pain Edu? --       Excl. in [place]? --      GENERAL APPEARANCE: Awake and alert. Cooperative. No acute distress.   HEAD: Normocephalic. Atraumatic.  EYES: EOM's grossly intact.  Conjunctiva is clear  ENT: Mucous membranes are moist.   NECK: Supple.  Trachea midline.  HEART: RRR.   LUNGS: Respirations unlabored. CTAB  ABDOMEN: Soft. Non-tender. No guarding or rebound.  EXTREMITIES: No acute deformities.  There is some nonpitting edema noted lower extremity.  No erythema or obvious calf tenderness.  SKIN: Warm and dry.  NEUROLOGICAL: No acute focal deficits appreciated  PSYCHIATRIC: Normal mood.    I have reviewed and interpreted all of the currently available lab results from this visit (if applicable):  Results for orders placed or performed during the hospital encounter of 12/15/15   CBC auto differential   Result Value Ref Range    WBC 6.5 4.0 - 11.0 K/uL    RBC 4.73 4.00 - 5.20 M/uL    Hemoglobin 13.7 12.0 - 16.0 g/dL    Hematocrit 42.5 36.0 - 48.0 %    MCV 89.7 80.0 - 100.0 fL    MCH 29.0 26.0 - 34.0 pg    MCHC 32.3 31.0 - 36.0 g/dL    RDW 14.8 12.4 - 15.4 %    Platelets 163 135 - 450 K/uL    MPV 9.5 5.0 - 10.5 fL    Neutrophils % 46.5 %    Lymphocytes Relative 34.3 %    Monocytes % 12.8 %    Eosinophils Relative Percent 5.6 %    Basophils % 0.8 %    Neutrophils # 3.0 1.7 - 7.7 K/uL  Lymphocytes # 2.2 1.0 - 5.1 K/uL    Monocytes # 0.8 0.0 - 1.3 K/uL    Eosinophils # 0.4 0.0 - 0.6 K/uL    Basophils # 0.1 0.0 - 0.2 K/uL    Basic metabolic panel   Result Value Ref Range    Sodium 142 136 - 145 mmol/L    Potassium 3.9 3.5 - 5.1 mmol/L    Chloride 103 99 - 110 mmol/L    CO2 27 21 - 32 mmol/L    Anion Gap 12 3 - 16    Glucose 187 (H) 70 - 99 mg/dL    BUN 13 7 - 20 mg/dL    CREATININE 0.7 0.6 - 1.1 mg/dL    GFR Non-African American >60 >60    GFR African American >60 >60    Calcium 9.0 8.3 - 10.6 mg/dL   Brain Natriuretic Peptide   Result Value Ref Range    Pro-BNP 210 (H) 0 - 124 pg/mL   Troponin   Result Value Ref Range    Troponin <0.01 <0.01 ng/mL   D-dimer, quantitative   Result Value Ref Range    D-Dimer, Quant 364 (H) 0 - 229 ng/mL DDU   Hepatic function panel   Result Value Ref Range    Total Protein 6.8 6.4 - 8.2 g/dL    Alb 3.6 3.4 - 5.0 g/dL    Alkaline Phosphatase 88 40 - 129 U/L    ALT 85 (H) 10 - 40 U/L    AST 66 (H) 15 - 37 U/L    Total Bilirubin 0.3 0.0 - 1.0 mg/dL    Bilirubin, Direct <0.2 0.0 - 0.3 mg/dL    Bilirubin, Indirect see below 0.0 - 1.0 mg/dL        Radiographs (if obtained):  []  The following radiograph was interpreted by myself in the absence of a radiologist:  [x]  Radiologist's Report Reviewed:  CTA Chest With Contrast   Final Result   1. No definite scan evidence for pulmonary embolus or other acute process.   2. Coronary artery disease.         XR Chest Standard TWO VW   Final Result   No acute process.             EKG (if obtained): (All EKG's are interpreted by myself in the absence of a cardiologist)  Normal sinus rhythm at 60 bpm unremarkable axis and intervals no obvious ischemic changes when compared to previous EKG    MDM:  On my initial evaluation the patient she looked good that she described more of a sharp left-sided chest pain though I do see where she is got multiple risk factors when started with a basic cardiac workup.  Her workup was benign I initial thought was we admit her as she is got to coronary disease and initially saw her she had abnormal stress in the past.  Though in talking with  her she tells me she's been in the hospital multiple times with chest pain.  She seems like this is similar pain when she's had before.  I did a little deeper into her records and talking with her looks like she had an abnormal stress related a cardiac cath less than 3 months ago and it was normal.  She does have a history of PE so I didn't a d-dimer initially was elevated so went ahead and did a CT of her chest which didn't show any significant pulmonary edema no PE.  Her BNP  was only slightly elevated to 210.  I consider doing a delta troponin she's had pain all day so I'm not sure that that would really make a difference.  When everything was back she was a little better though the pain was starting to come back again discussed admission though I really don't think he'll do much at this point she's been worked up multiple times she is comfortable with this.  With regards to her edema she is concerned about the weight gain over the last year she does have edema in her legs but not that impressed.  She started on hydrochlorothiazide I think that she can discuss with her physician if they feel Lasix is appropriate but they can follow that.  Once again we discussed possible admission though I she's comfortable being discharged home I'll give her something for the pain.  She follow-up with her doctor and her cardiologist next several days which should not hesitate to return if any concerns arise.      Clinical Impression:  1. Chest pain, unspecified type    2. Peripheral edema          Discharge Medication List as of 12/16/2015  1:25 AM      START taking these medications    Details   HYDROcodone-acetaminophen (NORCO) 5-325 MG per tablet Take 1 tablet by mouth 4 times daily as needed (pain) ., Disp-15 tablet, R-0             (Please note that portions of this note may have been completed with a voice recognition program. Efforts were made to edit the dictations but occasionally words are mis-transcribed.)      Anselmo Pickler, [place], [place]  12/16/15 [phone]

## 2015-12-15 NOTE — ED Notes (Signed)
Patient from home with c/o cp, SOB and bilateral lower extremity edema that started today. Patient reports she's been having intermittent bilateral lower extremity edema for a while, reports it to pcp but nothing has been done about it. EKG completed in triage, IV access obtained, blood sent to lab, patient taken to xray in wheelchair. Will continue to monitor.      Nichola Sizer, RN  12/15/15 2052

## 2015-12-16 ENCOUNTER — Inpatient Hospital Stay
Admit: 2015-12-16 | Discharge: 2015-12-16 | Disposition: A | Discharge status: Home / Self Care | Payer: PRIVATE HEALTH INSURANCE | Attending: Emergency Medicine

## 2015-12-16 LAB — HEPATIC FUNCTION PANEL
ALT: 85 U/L — ABNORMAL HIGH (ref 10–40)
AST: 66 U/L — ABNORMAL HIGH (ref 15–37)
Albumin: 3.6 g/dL (ref 3.4–5.0)
Alkaline Phosphatase: 88 U/L (ref 40–129)
Bilirubin, Direct: <0.2 mg/dL (ref 0.0–0.3)
Total Bilirubin: 0.3 mg/dL (ref 0.0–1.0)
Total Protein: 6.8 g/dL (ref 6.4–8.2)

## 2015-12-16 LAB — BASIC METABOLIC PANEL
Anion Gap: 12 (ref 3–16)
BUN: 13 mg/dL (ref 7–20)
CO2: 27 mmol/L (ref 21–32)
Calcium: 9 mg/dL (ref 8.3–10.6)
Chloride: 103 mmol/L (ref 99–110)
Creatinine: 0.7 mg/dL (ref 0.6–1.1)
GFR African American: >60 (ref >60)
GFR Non-African American: >60 (ref >60)
Glucose: 187 mg/dL — ABNORMAL HIGH (ref 70–99)
Potassium: 3.9 mmol/L (ref 3.5–5.1)
Sodium: 142 mmol/L (ref 136–145)

## 2015-12-16 LAB — CBC WITH AUTO DIFFERENTIAL
Basophils %: 0.8 %
Basophils Absolute: 0.1 10*3/uL (ref 0.0–0.2)
Eosinophils %: 5.6 %
Eosinophils Absolute: 0.4 10*3/uL (ref 0.0–0.6)
Hematocrit: 42.5 % (ref 36.0–48.0)
Hemoglobin: 13.7 g/dL (ref 12.0–16.0)
Lymphocytes %: 34.3 %
Lymphocytes Absolute: 2.2 10*3/uL (ref 1.0–5.1)
MCH: 29 pg (ref 26.0–34.0)
MCHC: 32.3 g/dL (ref 31.0–36.0)
MCV: 89.7 fL (ref 80.0–100.0)
MPV: 9.5 fL (ref 5.0–10.5)
Monocytes %: 12.8 %
Monocytes Absolute: 0.8 10*3/uL (ref 0.0–1.3)
Neutrophils %: 46.5 %
Neutrophils Absolute: 3 10*3/uL (ref 1.7–7.7)
Platelets: 163 10*3/uL (ref 135–450)
RBC: 4.73 M/uL (ref 4.00–5.20)
RDW: 14.8 % (ref 12.4–15.4)
WBC: 6.5 10*3/uL (ref 4.0–11.0)

## 2015-12-16 LAB — BRAIN NATRIURETIC PEPTIDE: Pro-BNP: 210 pg/mL — ABNORMAL HIGH (ref 0–124)

## 2015-12-16 LAB — D-DIMER, QUANTITATIVE: D-Dimer, Quant: 364 ng/mL DDU — ABNORMAL HIGH (ref 0–229)

## 2015-12-16 LAB — TROPONIN: Troponin: <0.01 ng/mL (ref <0.01)

## 2015-12-16 MED ORDER — ASPIRIN 81 MG PO CHEW
81 MG | Freq: Once | ORAL | Status: AC
Start: 2015-12-16 — End: 2015-12-15
  Administered 2015-12-16: 02:00:00 via ORAL

## 2015-12-16 MED ORDER — HYDROCODONE-ACETAMINOPHEN 5-325 MG PO TABS
5-325 MG | Freq: Once | ORAL | Status: AC
Start: 2015-12-16 — End: 2015-12-16
  Administered 2015-12-16: 06:00:00 via ORAL

## 2015-12-16 MED ORDER — IOPAMIDOL 76 % IV SOLN
76 % | Freq: Once | INTRAVENOUS | Status: AC | PRN
Start: 2015-12-16 — End: 2015-12-15
  Administered 2015-12-16: 04:00:00 via INTRAVENOUS

## 2015-12-16 MED ORDER — KETOROLAC TROMETHAMINE 30 MG/ML IJ SOLN
30 MG/ML | Freq: Once | INTRAMUSCULAR | Status: AC
Start: 2015-12-16 — End: 2015-12-15
  Administered 2015-12-16: 04:00:00 via INTRAVENOUS

## 2015-12-16 MED ORDER — NITROGLYCERIN 0.4 MG SL SUBL
0.4 MG | Freq: Once | SUBLINGUAL | Status: AC
Start: 2015-12-16 — End: 2015-12-15
  Administered 2015-12-16: 02:00:00 via SUBLINGUAL

## 2015-12-16 MED ORDER — HYDROCODONE-ACETAMINOPHEN 5-325 MG PO TABS
5-325 MG | ORAL_TABLET | Freq: Four times a day (QID) | ORAL | 0 refills | Status: DC | PRN
Start: 2015-12-16 — End: 2016-03-23

## 2015-12-16 MED FILL — KETOROLAC TROMETHAMINE 30 MG/ML IJ SOLN: 30 MG/ML | INTRAMUSCULAR | Qty: 1

## 2015-12-16 MED FILL — HYDROCODONE-ACETAMINOPHEN 5-325 MG PO TABS: 5-325 MG | ORAL | Qty: 1

## 2015-12-16 MED FILL — ASPIRIN 81 MG PO CHEW: 81 MG | ORAL | Qty: 4

## 2015-12-16 MED FILL — ISOVUE-370 76 % IV SOLN: 76 % | INTRAVENOUS | Qty: 100

## 2015-12-16 MED FILL — NITROSTAT 0.4 MG SL SUBL: 0.4 MG | SUBLINGUAL | Qty: 25

## 2015-12-16 NOTE — Care Coordination-Inpatient (Signed)
Outgoing telephone call, no answer. Phone continued to ring, then went to a busy signal.

## 2015-12-16 NOTE — ED Notes (Signed)
Patient given discharge instructions, verbalized understanding, denies any questions at this time. Pt discharged home with spouse.      Nichola Sizer, RN  12/16/15 [phone]

## 2015-12-17 LAB — EKG 12-LEAD
Atrial Rate: 68 {beats}/min
Diagnosis: NORMAL
P Axis: 3 degrees
P-R Interval: 132 ms
Q-T Interval: 410 ms
QRS Duration: 76 ms
QTc Calculation (Bazett): 435 ms
R Axis: -7 degrees
T Axis: -2 degrees
Ventricular Rate: 68 {beats}/min

## 2015-12-17 NOTE — Telephone Encounter (Signed)
Pt called back to go over results. Gave her results and let her know that we ordered her machine today to [facility] and that she should her from her within 1-2 weeks and reminded her of her cnfu appointment in February. Pt understood and had no further questions.

## 2015-12-17 NOTE — Telephone Encounter (Signed)
-----   Message from Abner Greenspan sent at 12/17/2015  9:58 AM EST -----  Hi girls,   Rachael Mays dob Feb 06, 2056 called this morning and left a VM on the labs phone.   She had her 2 studies done 12/27 & 12/29 and I just scanned them in yesterday I believe.   She's wondering why the DME company doesn't have her paperwork, etc.   She might not know the flow of things... and needs a phone call back.     Sorry for another msg   Her number is [phone]  Thanks!   Lattie Haw

## 2015-12-17 NOTE — Telephone Encounter (Signed)
Lmom, her order for a new machine was faxed today to [facility]

## 2015-12-17 NOTE — Telephone Encounter (Signed)
-----   Message from Abner Greenspan sent at 12/17/2015  9:58 AM EST -----  Hi girls,   Rachael Mays dob March 04, 2056 called this morning and left a VM on the labs phone.   She had her 2 studies done 12/27 & 12/29 and I just scanned them in yesterday I believe.   She's wondering why the DME company doesn't have her paperwork, etc.   She might not know the flow of things... and needs a phone call back.     Sorry for another msg   Her number is [phone]  Thanks!   Lattie Haw

## 2015-12-17 NOTE — Telephone Encounter (Signed)
Called and left message for pt to go over sleep study results and explain the process of ordering machine.

## 2015-12-18 ENCOUNTER — Encounter: Attending: Physician Assistant | Primary: Student in an Organized Health Care Education/Training Program

## 2015-12-19 ENCOUNTER — Ambulatory Visit
Admit: 2015-12-19 | Discharge: 2015-12-19 | Payer: PRIVATE HEALTH INSURANCE | Attending: Family Medicine | Primary: Student in an Organized Health Care Education/Training Program

## 2015-12-19 DIAGNOSIS — R5381 Other malaise: Secondary | ICD-10-CM

## 2015-12-19 LAB — POC URINE WITH MICROSCOPIC
Bacteria Urine, POC: 0
Bilirubin Urine: 0 mg/dL
Blood, Urine: NEGATIVE
Casts Urine, POC: 0
Epi Cells Urine, POC: 0
Glucose, Ur: 0
Ketones, Urine: NEGATIVE
Leukocytes, UA: 0
Nitrite, Urine: NEGATIVE
Protein, UA: NEGATIVE
RBC Urine, POC: 0
Specific Gravity, UA: 1.03 (ref 1.005–1.030)
Urobilinogen, Urine: NORMAL
WBC Urine, POC: 0
crystals urine, poc: 0
pH, UA: 6 (ref 4.5–8.0)
yeast urine, poc: 0

## 2015-12-19 MED ORDER — ALBUTEROL SULFATE HFA 108 (90 BASE) MCG/ACT IN AERS
108 (90 Base) MCG/ACT | Freq: Four times a day (QID) | RESPIRATORY_TRACT | 3 refills | Status: DC | PRN
Start: 2015-12-19 — End: 2015-12-27

## 2015-12-19 MED ORDER — CYCLOBENZAPRINE HCL 10 MG PO TABS
10 | ORAL_TABLET | ORAL | 0 refills | Status: DC | PRN
Start: 2015-12-19 — End: 2015-12-20

## 2015-12-19 NOTE — Progress Notes (Signed)
Patient is here for an emergency room follow-up from [facility] for chest pain.  Patient states that she is still having chest pain.   Associated symptoms are dizziness and palpitations.  Patient states that her symptoms are gradually worsening.  Patient was taking flexeril and norco that provides mild relief.

## 2015-12-19 NOTE — Progress Notes (Signed)
Subjective:      Patient ID: Rachael Mays is a 60 y.o. female.    HPI     Here for ER follow up after visit 12/15/2015 for chest pain, leg swelling, and dyspnea.  D dimer was elevated to 364 so CTA chest performed showing no acute PE or other acute process.  BNP 210, ALT/AST 85/66.  Glucose 187.  Remainder of labs normal.      Since ER visit, sharp chest pain continues.  Bilateral lower legs with swelling.  Feeling tired but hoping that getting CPAP machine will help as she has been told that she has severe sleep apnea.  No diet changes.  Not eating much salt.  Diarrhea 4x/day but no nausea or emesis.  Drinking fluids well.      Cardiac Cath 09/20/15:  Anatomy:   LM-normal  LAD-normal  Cx-normal  OM1- normal  RCA-normal, codominant  RPDA- normal  LVEF- 60%    Review of Systems    Patient Active Problem List   Diagnosis   ??? Back pain   ??? Deep vein thrombosis ([place])   ??? Depression   ??? Hyperlipidemia   ??? Pulmonary embolism ([place])   ??? Nausea & vomiting   ??? Abdominal  pain, other specified site   ??? Morbid obesity ([place])   ??? Status post gastric banding   ??? Vertigo   ??? Dizziness   ??? Tinnitus, subjective   ??? Other complications of gastric band procedure   ??? Essential hypertension   ??? Hyperglycemia   ??? Chest pain   ??? 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 Adrenal mass, left ([place])   ??? Adrenal nodule ([place])   ??? Obstructive sleep apnea (adult) (pediatric)   ??? Dyspnea and respiratory abnormalities   ??? Abnormal stress test       Outpatient Prescriptions Marked as Taking for the 12/19/15 encounter (Office Visit) with Harvel Quale, MD   Medication Sig Dispense Refill   ??? HYDROcodone-acetaminophen (NORCO) 5-325 MG per tablet Take 1 tablet by mouth 4 times daily as needed (pain) . 15 tablet 0   ??? pregabalin (LYRICA) 50 MG capsule Take 50 mg by mouth 2 times daily     ??? fluocinonide (LIDEX) 0.05 % cream APPLY TOPICALLY TWICE A DAY TO AFFECTED AREA 15 g 1   ??? hydrALAZINE (APRESOLINE) 25 MG tablet Take 1 tablet by mouth 3 times daily  270 tablet 3   ??? cyclobenzaprine (FLEXERIL) 10 MG tablet Take 10 mg by mouth as needed for Muscle spasms     ??? hydrochlorothiazide (HYDRODIURIL) 25 MG tablet TAKE ONE TABLET BY MOUTH DAILY 90 tablet 1   ??? metoclopramide (REGLAN) 5 MG tablet TAKE ONE TABLET BY MOUTH FOUR TIMES A DAY BEFORE MEALS AND AT BEDTIME 120 tablet 0   ??? metoprolol (LOPRESSOR) 100 MG tablet Take 1.5 tablets by mouth 2 times daily 270 tablet 0   ??? montelukast (SINGULAIR) 10 MG tablet TAKE 1 TABLET BY MOUTH DAILY 90 tablet 1   ??? lisinopril (PRINIVIL;ZESTRIL) 40 MG tablet Take 40 mg by mouth daily     ??? traZODone (DESYREL) 100 MG tablet Take 1 tablet by mouth nightly 90 tablet 1   ??? ALPRAZolam (XANAX) 0.5 MG tablet Take 1 tablet by mouth 2 times daily as needed for Anxiety 60 tablet 0   ??? ondansetron (ZOFRAN) 4 MG tablet Take 1 tablet by mouth daily as needed for Nausea or Vomiting 30 tablet 1   ??? venlafaxine [facility]  XR) 150 MG XR capsule Take 1 capsule by mouth daily 90 capsule 3   ??? nystatin 100000 UNIT/GM POWD APPLY 3 TIMES DAILY 30 g 4   ??? Multiple Vitamins-Minerals (MULTIVITAMIN PO) Take by mouth     ??? CALCIUM-VITAMIN D PO Take by mouth     ??? albuterol (PROVENTIL HFA) 108 (90 BASE) MCG/ACT inhaler Inhale 2 puffs into the lungs every 6 hours as needed for Wheezing 3 Inhaler 3   ??? omeprazole (PRILOSEC) 40 MG capsule Take 1 capsule by mouth daily. 30 capsule 3   ??? cetirizine (ZYRTEC ALLERGY) 10 MG tablet Take 10 mg by mouth daily.         Allergies   Allergen Reactions   ??? Morphine Itching   ??? Talwin [Pentazocine] Other (See Comments)     dizzy       Social History   Substance Use Topics   ??? Smoking status: Former Smoker     Packs/day: 0.50     Years: 40.00     Types: Cigarettes     Quit date: 08/07/2012   ??? Smokeless tobacco: Never Used      Comment: started to smoke at age 63 / only smoked 0.5 p.p.d    ??? Alcohol use 0.0 oz/week     0 Standard drinks or equivalent per week      Comment: occasionally        Objective:     Visit Vitals   ??? BP  116/68   ??? Pulse 70   ??? Wt (!) 320 lb 6.4 oz (145.3 kg)   ??? SpO2 97%   ??? BMI 51.71 kg/m2       Physical Exam   Constitutional: She is oriented to person, place, and time. She appears well-developed and well-nourished. No distress.   Cardiovascular: Normal rate, regular rhythm and normal heart sounds.    No murmur heard.  Trace to 1+ edema bilateral lower extremities   Pulmonary/Chest: Effort normal and breath sounds normal. She has no wheezes. She has no rales.   Neurological: She is alert and oriented to person, place, and time.   Psychiatric: She has a normal mood and affect. Her behavior is normal.       Assessment:     1. Malaise  POC URINE with Microscopic   2. Chest pain, unspecified type     3. Edema, unspecified type       Plan:     Uncertain etiology of symptoms.    Extensive w/u has been negative.  U/a normal.  D/c Reglan as she has been on this long-term.  Avoid salt.  Add compression stocking.

## 2015-12-19 NOTE — Patient Instructions (Signed)
Stop Reglan.  Hold lisinopril for 2-3 days.    New bp cuff with readings called to the office .

## 2015-12-20 MED ORDER — BLOOD PRESSURE MONITOR KIT
PACK | 0 refills | Status: DC
Start: 2015-12-20 — End: 2017-01-27

## 2015-12-20 MED ORDER — CYCLOBENZAPRINE HCL 10 MG PO TABS
10 MG | ORAL_TABLET | Freq: Three times a day (TID) | ORAL | 0 refills | Status: DC | PRN
Start: 2015-12-20 — End: 2016-03-23

## 2015-12-20 NOTE — Telephone Encounter (Signed)
rx for blood pressure  kit sent to [facility]

## 2015-12-20 NOTE — Telephone Encounter (Signed)
Patient calling  Requesting an order for a blood pressure cuff be sent to [facility] on [address].  She says that she can use her benecard to pay for it this way.  Please advise.

## 2015-12-27 MED ORDER — ALBUTEROL SULFATE HFA 108 (90 BASE) MCG/ACT IN AERS
108 (90 Base) MCG/ACT | Freq: Four times a day (QID) | RESPIRATORY_TRACT | 3 refills | Status: DC | PRN
Start: 2015-12-27 — End: 2019-07-13

## 2015-12-27 NOTE — Telephone Encounter (Signed)
Patient had the follow ing prescription sent to [facility],  But Her insurance will only pay for it if it comes from  [facility].    Can this be sent to [facility] Please    albuterol sulfate HFA (PROVENTIL HFA) 108 (90 BASE) MCG/ACT inhaler [id number]

## 2016-01-02 ENCOUNTER — Encounter

## 2016-01-02 MED ORDER — TRAZODONE HCL 100 MG PO TABS
100 MG | ORAL_TABLET | ORAL | 1 refills | Status: DC
Start: 2016-01-02 — End: 2016-08-02

## 2016-01-20 NOTE — Telephone Encounter (Signed)
Pt would like to know if [facility] would be willing to call in a Rx to her local pharmacy. She is having trouble breathing,bad cough, fever,yellow drainage from nose, pain in shoulder/back area from coughing possibly. This started about 4 days ago, has been treating with OTC medication that offers little to no relief.     Pharmacy Confirmed-[facility])    Please advise  Pt callback-[phone]

## 2016-01-20 NOTE — Telephone Encounter (Signed)
Advised pt that her sx's are likely viral, needs to give about 1 week then should improve.

## 2016-01-20 NOTE — Telephone Encounter (Signed)
This is likely a viral infection.  Needs a week as least to improve.

## 2016-01-20 NOTE — Telephone Encounter (Signed)
Please advise

## 2016-02-03 ENCOUNTER — Ambulatory Visit
Admit: 2016-02-03 | Discharge: 2016-02-03 | Payer: PRIVATE HEALTH INSURANCE | Attending: Family | Primary: Student in an Organized Health Care Education/Training Program

## 2016-02-03 DIAGNOSIS — G4733 Obstructive sleep apnea (adult) (pediatric): Secondary | ICD-10-CM

## 2016-02-03 NOTE — Progress Notes (Signed)
Magdalene River MD, FAASM, FCCP  Judithann Graves, MSN, RN, CNP [place]  [address]  [address], [place]  [address]  [place] [phone]   [facility] [address]  F- [phone]       [place] [facility]    Subjective:     Patient ID: Rachael Mays is a 60 y.o. female.    Chief Complaint   Patient presents with   ??? Sleep Apnea       HPI:    HPI:   ??  PSG done on 12/03/2015 AHI 48.1/hr with Low O2 88% time less than 90% was <61mn  ??  Titration done on 12/05/2015  ??  Bilevel settings: Auto mode IPAPmax-25 EPAPmin-11 PSmin-3 PSmax-6 Flex-3 Hum-5 Average P- 16.7/12.9 Comp 7.5hrs/night Download AHI - 10.1/hr Mask- wisp Machine download/SD card data reviewed and documented.  ??  Epworth - Total score: 10  ??  Follow-up :   ??  Last Visit : December 2016  ????  Co-morbidities are well controlled and stable per the patient at this time.   ??  Subjective Health Changes: None    ??  Follow-up :   Patient is compliant with the machine Yes   Feeling rested when using the machine Yes  Pressure is comfortable with inspiration and expiration Yes  Mask is fitting well, Yes  Noting air leak Yes  ????  Using trazodone- 1045mnightly per PMD  ????  Having painful Aerophagia occasionally  Nocturia x 0-1 per night.  Having HA No , Dry mouth No , Congestion Yes, or nose bleeds No per the patient.   ????  Understands how to change humidification and/or tubing temperature for comfort while at home Yes .  ????  Difficulties falling asleep No, or staying asleep No.  Approximate time to bed 2am and rising approximately 2pm  Taking no naps, usual length na  Drowsy when driving No .   ??  DOT/CDL: No  FAA/[employer] license: No  ??  Any concerns noted with the machine at this time No.  ??  Review of Systems   Constitutional: Negative for appetite change, chills, fatigue and fever.   HENT: Negative for congestion, nosebleeds, rhinorrhea and sinus pressure.    Respiratory: Negative for apnea, cough and shortness of breath.   Cardiovascular: Negative for chest pain and palpitations.   Gastrointestinal: Negative for abdominal distention and abdominal pain.   Neurological: Negative for dizziness and headaches.   ??  Review of Systems   Constitutional: Negative for appetite change, chills, fatigue and fever.   HENT: Negative for congestion, nosebleeds, rhinorrhea and sinus pressure.    Respiratory: Negative for apnea, cough and shortness of breath.    Cardiovascular: Negative for chest pain and palpitations.   Gastrointestinal: Negative for abdominal distention and abdominal pain.   Neurological: Negative for dizziness and headaches.       Social History     Social History   ??? Marital status: Married     Spouse name: N/A   ??? Number of children: N/A   ??? Years of education: N/A     Occupational History   ??? Not on file.     Social History Main Topics   ??? Smoking status: Former Smoker     Packs/day: 0.50     Years: 40.00     Types: Cigarettes     Quit date:  08/07/2012   ??? Smokeless tobacco: Never Used      Comment: started to smoke at age 31 / only smoked 0.5 p.p.d    ??? Alcohol use 0.0 oz/week     0 Standard drinks or equivalent per week      Comment: occasionally    ??? Drug use: No   ??? Sexual activity: Not on file     Other Topics Concern   ??? Not on file     Social History Narrative    ** Merged History Encounter **            Prior to Admission medications    Medication Sig Start Date End Date Taking? Authorizing Provider   traZODone (DESYREL) 100 MG tablet TAKE 1 TABLET BY MOUTH NIGHTLY 01/02/16  Yes Harvel Quale, MD   albuterol sulfate HFA (PROVENTIL HFA) 108 (90 BASE) MCG/ACT inhaler Inhale 2 puffs into the lungs every 6 hours as needed for Wheezing 12/27/15  Yes Harvel Quale, MD   Blood Pressure Monitor KIT Use daily as needed 12/20/15  Yes Harvel Quale, MD   cyclobenzaprine (FLEXERIL) 10 MG tablet Take 1 tablet by mouth 3 times daily as needed  for Muscle spasms 12/20/15  Yes Jamal Collin, MD   HYDROcodone-acetaminophen (NORCO) 5-325 MG per tablet Take 1 tablet by mouth 4 times daily as needed (pain) . 12/16/15  Yes Nigel Bridgeman, DO   pregabalin (LYRICA) 50 MG capsule Take 50 mg by mouth 2 times daily   Yes Historical Provider, MD   Umeclidinium-Vilanterol ([place]) Inhale into the lungs   Yes Historical Provider, MD   fluocinonide (LIDEX) 0.05 % cream APPLY TOPICALLY TWICE A DAY TO AFFECTED AREA 11/22/15  Yes Harvel Quale, MD   hydrALAZINE (APRESOLINE) 25 MG tablet Take 1 tablet by mouth 3 times daily 11/12/15  Yes Harvel Quale, MD   hydrochlorothiazide (HYDRODIURIL) 25 MG tablet TAKE ONE TABLET BY MOUTH DAILY 10/25/15  Yes Harvel Quale, MD   metoprolol (LOPRESSOR) 100 MG tablet Take 1.5 tablets by mouth 2 times daily 09/26/15  Yes Harvel Quale, MD   montelukast (SINGULAIR) 10 MG tablet TAKE 1 TABLET BY MOUTH DAILY 07/31/15  Yes Jamal Collin, MD   lisinopril (PRINIVIL;ZESTRIL) 40 MG tablet Take 40 mg by mouth daily   Yes Historical Provider, MD   ALPRAZolam Duanne Moron) 0.5 MG tablet Take 1 tablet by mouth 2 times daily as needed for Anxiety 06/03/15  Yes Harvel Quale, MD   ondansetron [facility]) 4 MG tablet Take 1 tablet by mouth daily as needed for Nausea or Vomiting 06/03/15  Yes Lucky Rathke Rayford Halsted, MD   venlafaxine (EFFEXOR XR) 150 MG XR capsule Take 1 capsule by mouth daily 06/03/15  Yes Harvel Quale, MD   nystatin 100000 UNIT/GM POWD APPLY 3 TIMES DAILY 04/08/15  Yes Harvel Quale, MD   Multiple Vitamins-Minerals (MULTIVITAMIN PO) Take by mouth   Yes Historical Provider, MD   CALCIUM-VITAMIN D PO Take by mouth   Yes Historical Provider, MD   omeprazole (PRILOSEC) 40 MG capsule Take 1 capsule by mouth daily. 07/21/13  Yes Norwood Levo, MD   cetirizine (ZYRTEC ALLERGY) 10 MG tablet Take 10 mg by mouth daily.   Yes Historical Provider, MD       Allergies as of  02/03/2016 - Review Complete 02/03/2016   Allergen Reaction Noted   ???  Morphine Itching 05/04/2014   ??? Talwin [pentazocine] Other (See Comments) 03/29/2013       Patient Active Problem List   Diagnosis   ??? Back pain   ??? Deep vein thrombosis ([place])   ??? Depression   ??? Hyperlipidemia   ??? Pulmonary embolism ([place])   ??? Nausea & vomiting   ??? Abdominal  pain, other specified site   ??? Morbid obesity ([place])   ??? Status post gastric banding   ??? Vertigo   ??? Dizziness   ??? Tinnitus, subjective   ??? Other complications of gastric band procedure   ??? Essential hypertension   ??? Hyperglycemia   ??? Chest pain   ??? 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 Adrenal mass, left ([place])   ??? Adrenal nodule ([place])   ??? Obstructive sleep apnea (adult) (pediatric)   ??? Dyspnea and respiratory abnormalities   ??? Abnormal stress test       Past Medical History   Diagnosis Date   ??? Anxiety    ??? Asthma    ??? Chronic back pain    ??? Deep vein thrombosis    ??? Depression    ??? GERD (gastroesophageal reflux disease)      NO LONGER SINCE LAP   ??? GERD (gastroesophageal reflux disease) 01/23/2009   ??? Hyperlipidemia      hx; resolved with lap band   ??? Hypertension    ??? Obesity      hx of; had lap band   ??? Obstructive sleep apnea (adult) (pediatric) 09/18/2015   ??? Pulmonary embolism [facility])        Past Surgical History   Procedure Laterality Date   ??? Colonoscopy     ??? Cesarean section     ??? Cholecystectomy     ??? Hysterectomy     ??? Shoulder surgery     ??? Lap band  05/01/08     Dr. Nicole Cella   ??? Back surgery       neck  plates   ??? Colonoscopy  04/08/10   ??? Other surgical history       [place] Filter: curently in place   ??? Upper gastrointestinal endoscopy  05/19/13   ??? Other surgical history  07/05/13     lap band removal   ??? Upper gastrointestinal endoscopy  07/21/13     ESOPHAGEAL STENT PLACEMENT   ??? Partial hysterectomy     ??? Upper gastrointestinal endoscopy N/A 07/31/2014     Esophagogastroduodenoscopy with esophageal balloon dilation   ??? Vascular surgery  04/2015     Lucendia Herrlich, celiac artery  angiogram, normal abd arteries       Family History   Problem Relation Age of Onset   ??? Arthritis Mother    ??? Cancer Mother    ??? Depression Mother    ??? High Blood Pressure Mother    ??? Heart Disease Mother 69     MI    ??? Ovarian Cancer Mother 53   ??? Cancer Father 73     colon   ??? Heart Disease Maternal Grandmother    ??? Early Death Paternal Grandmother    ??? Heart Disease Paternal Grandfather    ??? Diabetes Other    ??? High Blood Pressure Other    ??? Obesity Other    ??? Other Sister      OSA   ??? Other Brother      OSA   ??? Other Brother      OSA   ???  Other Daughter      OSA       Vitals:  Weight BMI   Wt Readings from Last 3 Encounters:   02/03/16 (!) 316 lb (143.3 kg)   12/19/15 (!) 320 lb 6.4 oz (145.3 kg)   12/15/15 (!) 305 lb (138.3 kg)    Body mass index is 51 kg/(m^2).     BP HR SaO2   BP Readings from Last 3 Encounters:   02/03/16 128/78   12/19/15 116/68   12/16/15 (!) 147/76    Pulse Readings from Last 3 Encounters:   02/03/16 63   12/19/15 70   12/16/15 63    SpO2 Readings from Last 3 Encounters:   02/03/16 98%   12/19/15 97%   12/16/15 94%        Assessment:     1. Obstructive sleep apnea syndrome      New Diagnosis with treatment    2. Essential hypertension Stable    3. Uncomplicated asthma, unspecified asthma severity Stable    4. Depression, unspecified depression type Stable    5. Morbid obesity, unspecified obesity type ([place]) Stable    6. Chronic obstructive pulmonary disease, unspecified COPD type ([place]) Stable      Plan:   - Reviewed compliance download with pt.   -Supplies and parts as needed for his machine, these are medically necessary.   - Patient using [facility] [employer] for supplies  -Continue medications per his PCP and other physicians.   -Limit caffeine use after 3pm.   -Encouraged him to work on weight loss through diet and exercise.  - Will see back to monitor AHI  - Ramp 8  - Needs to try the Nasal Pillow  - Spoke about sleep compression  -F/U: 3 month.  ??    No orders of the defined types were  placed in this encounter.      No orders of the defined types were placed in this encounter.      No orders of the defined types were placed in this encounter.        Judithann Graves, MSN, RN, CNP

## 2016-02-04 LAB — DRUG PANEL-PM-HI RES-UR INTERP-A
6-Acetylmorphine: NOT DETECTED
7-aminoclonazepam: NOT DETECTED
Alpha-OH-alprazolam: NOT DETECTED
Alprazolam: NOT DETECTED
Amphetamine: NOT DETECTED
Barbiturates: NOT DETECTED
Benzoylecgonine: NOT DETECTED
Buprenorphine: NOT DETECTED
CARISOPRODOL: NOT DETECTED
Clonazepam: NOT DETECTED
Codeine: NOT DETECTED
Creatinine, Ur: 228.2 mg/dL (ref 20.0–400.0)
Diazepam: NOT DETECTED
Ethyl Glucuronide: NOT DETECTED
Fentanyl: NOT DETECTED
Hydrocodone: NOT DETECTED
Hydromorphone: NOT DETECTED
Lorazepam: NOT DETECTED
MDA: NOT DETECTED
MDEA: NOT DETECTED
MDMA, Urine: NOT DETECTED
Marijuana Metabolite: NOT DETECTED
Meperidine: NOT DETECTED
Methadone: NOT DETECTED
Methamphetamine: NOT DETECTED
Methylphenidate: NOT DETECTED
Midazolam: NOT DETECTED
Morphine: NOT DETECTED
Norbuprenorphine: NOT DETECTED
Nordiazepam: NOT DETECTED
Norfentanyl: NOT DETECTED
Norhydrocodone, Urine: NOT DETECTED
Noroxycodone: NOT DETECTED
Noroxymorphone, Urine: NOT DETECTED
OXAZEPAM: NOT DETECTED
Oxycodone: NOT DETECTED
Oxymorphone: NOT DETECTED
PCP: NOT DETECTED
Phentermine: NOT DETECTED
Propoxyphene: NOT DETECTED
TEMAZEPAM: NOT DETECTED
Tapentadol, Urine: NOT DETECTED
Tapentadol-O-Sulfate, Urine: NOT DETECTED
Zolpidem: NOT DETECTED

## 2016-02-13 ENCOUNTER — Ambulatory Visit
Admit: 2016-02-13 | Discharge: 2016-02-13 | Payer: PRIVATE HEALTH INSURANCE | Attending: Family Medicine | Primary: Student in an Organized Health Care Education/Training Program

## 2016-02-13 ENCOUNTER — Inpatient Hospital Stay: Attending: Family Medicine | Primary: Student in an Organized Health Care Education/Training Program

## 2016-02-13 ENCOUNTER — Encounter: Admit: 2016-02-13 | Primary: Student in an Organized Health Care Education/Training Program

## 2016-02-13 DIAGNOSIS — R509 Fever, unspecified: Secondary | ICD-10-CM

## 2016-02-13 LAB — POCT INFLUENZA A/B
Influenza A Ab: NEGATIVE
Influenza B Ab: NEGATIVE

## 2016-02-13 MED ORDER — OSELTAMIVIR PHOSPHATE 75 MG PO CAPS
75 MG | ORAL_CAPSULE | Freq: Two times a day (BID) | ORAL | 0 refills | Status: AC
Start: 2016-02-13 — End: 2016-02-18

## 2016-02-13 NOTE — Progress Notes (Signed)
Left message to call back.

## 2016-02-13 NOTE — Progress Notes (Signed)
Subjective:      Patient ID: Rachael Mays is a 60 y.o. female.    HPI Patient presents today for difficulty breathing, light headedness, chest pain, states that she staggers into walls when walking at home, running fever.  Sx's began yesterday afternoon.    Yesterday started with dizziness, pain in ribs, sharp pain in chest when takes deep breath. Nausea but no vomiting or diarrhea. No cough, no RN. Ears and throat ok. More SOB.      Review of Systems    Objective:   Physical Exam    Allergies   Allergen Reactions   ??? Morphine Itching   ??? Talwin [Pentazocine] Other (See Comments)     dizzy       Vitals:    02/13/16 1650   BP: 114/62   Site: Right Arm   Position: Sitting   Cuff Size: Large Adult   Pulse: 64   Temp: 101.4 ??F (38.6 ??C)   TempSrc: Temporal   SpO2: 94%   Weight: (!) 313 lb (142 kg)     Wt Readings from Last 3 Encounters:   02/13/16 (!) 313 lb (142 kg)   02/03/16 (!) 316 lb (143.3 kg)   12/19/15 (!) 320 lb 6.4 oz (145.3 kg)     Body mass index is 50.52 kg/(m^2).    Alert and oriented x 4 NAD, obese, well hydrated, well developed.  Left TM nl, canal nl and pinna nl  Right TM nl, canal nl and pinna nl  No nodes neck  Nares mild red, no drainage  OP mild erythema, no exudate, no swelling  Lung clear with fair movement  CV rrr      Assessment:      Eleasha was seen today for breathing problem.    Diagnoses and all orders for this visit:    Fever, unspecified fever cause  -     POCT Influenza A/B NEGATIVE  -     XR Chest Standard TWO VW; Future  Flu neg but given sx still concerned may be, check cxr, start tamiflu. If cxr + will stop tamiflu    SOB (shortness of breath)  -     XR Chest Standard TWO VW; Future    Other orders  -     oseltamivir (TAMIFLU) 75 MG capsule; Take 1 capsule by mouth 2 times daily for 5 days

## 2016-02-14 ENCOUNTER — Telehealth

## 2016-02-14 NOTE — Telephone Encounter (Signed)
Having sharp chest pain and high fever   cough is minimal;  Reviewed cxr findings and negative flu   told patient that flu test is often negative even when patient has the flu   suggested getting tamiflu and get cbc and BNP done

## 2016-02-17 ENCOUNTER — Telehealth

## 2016-02-17 ENCOUNTER — Inpatient Hospital Stay: Attending: Family Medicine | Primary: Sports Medicine

## 2016-02-17 DIAGNOSIS — R509 Fever, unspecified: Secondary | ICD-10-CM

## 2016-02-17 NOTE — Telephone Encounter (Signed)
-----   Message from Darrick Penna, MD sent at 02/13/2016  9:14 PM EST -----  Shows ? Fluid in lungs, given fever possibly viral but heart failure another cause  Get CBC with dif and BNP to help confirm dx

## 2016-02-17 NOTE — Telephone Encounter (Signed)
Patient notified and orders have been placed in [facility].  Patient will be going to [facility].

## 2016-02-18 LAB — CBC WITH AUTO DIFFERENTIAL
Basophils %: 0.7 %
Basophils Absolute: 0.1 10*3/uL (ref 0.0–0.2)
Eosinophils %: 6.4 %
Eosinophils Absolute: 0.5 10*3/uL (ref 0.0–0.6)
Hematocrit: 42.5 % (ref 36.0–48.0)
Hemoglobin: 13.7 g/dL (ref 12.0–16.0)
Lymphocytes %: 35.7 %
Lymphocytes Absolute: 2.8 10*3/uL (ref 1.0–5.1)
MCH: 28.5 pg (ref 26.0–34.0)
MCHC: 32.3 g/dL (ref 31.0–36.0)
MCV: 88.4 fL (ref 80.0–100.0)
MPV: 10.3 fL (ref 5.0–10.5)
Monocytes %: 9.8 %
Monocytes Absolute: 0.8 10*3/uL (ref 0.0–1.3)
Neutrophils %: 47.4 %
Neutrophils Absolute: 3.7 10*3/uL (ref 1.7–7.7)
Platelets: 149 10*3/uL (ref 135–450)
RBC: 4.81 M/uL (ref 4.00–5.20)
RDW: 14.8 % (ref 12.4–15.4)
WBC: 7.7 10*3/uL (ref 4.0–11.0)

## 2016-02-18 LAB — BRAIN NATRIURETIC PEPTIDE: Pro-BNP: 196 pg/mL — ABNORMAL HIGH (ref 0–124)

## 2016-02-18 NOTE — Telephone Encounter (Signed)
Please call pt with results of BW in chart completed  3/13

## 2016-02-18 NOTE — Telephone Encounter (Signed)
Please advise

## 2016-02-21 ENCOUNTER — Encounter: Attending: Family Medicine | Primary: Sports Medicine

## 2016-02-21 ENCOUNTER — Ambulatory Visit
Admit: 2016-02-21 | Discharge: 2016-02-21 | Payer: PRIVATE HEALTH INSURANCE | Attending: Adult Health | Primary: Sports Medicine

## 2016-02-21 DIAGNOSIS — R0789 Other chest pain: Secondary | ICD-10-CM

## 2016-02-21 LAB — SEDIMENTATION RATE: Sed Rate: 17 mm/Hr (ref 0–30)

## 2016-02-21 MED ORDER — FUROSEMIDE 20 MG PO TABS
20 MG | ORAL_TABLET | Freq: Every day | ORAL | 0 refills | Status: DC
Start: 2016-02-21 — End: 2016-03-09

## 2016-02-21 NOTE — Patient Instructions (Signed)
Labs today    Hold HCTZ and take furosemide 20 mg daily    appt in 2-3 weeks

## 2016-02-21 NOTE — Progress Notes (Signed)
[place]     Outpatient Follow Up Note    Rachael Mays is 60 y.o. female who presents today with a history of non-cardiac CP, abnormal myoview stress with normal epicardial arteries by cath Oct '16 and HTN. Her other history includes OSA     CHIEF COMPLAINT / HPI:  Follow Up secondary to calling with c/o SOB and rib pain when breathing. She had seen her PCP who felt her symptoms were viral in nature. Her CXR suggested HF; proBNP returned at 196.    Subjective:   Her mid-chest is tender with palpation. She had a temperature of 101.7 earlier this week. Her face felt flushed.     She denies significant chest pain. There is SOB/DOE with activities most noticeable over the past month. Its gradually gotten worse. The patient denies orthopnea/PND. She uses a BiPAP at HS. She follows with Dr. Jeb Levering. The patient has swelling on/off and lately more on. The patients weight is up. She had a lap ban removal after poor outcomes '13. The patient is not experiencing palpitations. She's been having a lot of dizziness and loses her balance.    These symptoms are worsening since the last OV.   With regard to medication therapy the patient has been compliant with prescribed regimen. They have tolerated therapy to date.     Past Medical History   Diagnosis Date   ??? Anxiety    ??? Asthma    ??? Chronic back pain    ??? Deep vein thrombosis    ??? Depression    ??? GERD (gastroesophageal reflux disease)      NO LONGER SINCE LAP   ??? GERD (gastroesophageal reflux disease) 01/23/2009   ??? Hyperlipidemia      hx; resolved with lap band   ??? Hypertension    ??? Obesity      hx of; had lap band   ??? Obstructive sleep apnea (adult) (pediatric) 09/18/2015   ??? Pulmonary embolism ([place])      Social History:    History   Smoking Status   ??? Former Smoker   ??? Packs/day: 0.50   ??? Years: 40.00   ??? Types: Cigarettes   ??? Quit date: 08/07/2012   Smokeless Tobacco   ??? Never Used     Comment: started to smoke at age 51 / only smoked 0.5 p.p.d      Current  Medications:  Current Outpatient Prescriptions   Medication Sig Dispense Refill   ??? traZODone (DESYREL) 100 MG tablet TAKE 1 TABLET BY MOUTH NIGHTLY 90 tablet 1   ??? albuterol sulfate HFA (PROVENTIL HFA) 108 (90 BASE) MCG/ACT inhaler Inhale 2 puffs into the lungs every 6 hours as needed for Wheezing 3 Inhaler 3   ??? Blood Pressure Monitor KIT Use daily as needed 1 kit 0   ??? cyclobenzaprine (FLEXERIL) 10 MG tablet Take 1 tablet by mouth 3 times daily as needed for Muscle spasms 30 tablet 0   ??? HYDROcodone-acetaminophen (NORCO) 5-325 MG per tablet Take 1 tablet by mouth 4 times daily as needed (pain) . 15 tablet 0   ??? pregabalin (LYRICA) 50 MG capsule Take 50 mg by mouth 2 times daily     ??? Umeclidinium-Vilanterol (ANORO ELLIPTA IN) Inhale into the lungs     ??? fluocinonide (LIDEX) 0.05 % cream APPLY TOPICALLY TWICE A DAY TO AFFECTED AREA 15 g 1   ??? hydrALAZINE (APRESOLINE) 25 MG tablet Take 1 tablet by mouth 3 times daily 270 tablet  3   ??? hydrochlorothiazide (HYDRODIURIL) 25 MG tablet TAKE ONE TABLET BY MOUTH DAILY 90 tablet 1   ??? metoprolol (LOPRESSOR) 100 MG tablet Take 1.5 tablets by mouth 2 times daily 270 tablet 0   ??? montelukast (SINGULAIR) 10 MG tablet TAKE 1 TABLET BY MOUTH DAILY 90 tablet 1   ??? lisinopril (PRINIVIL;ZESTRIL) 40 MG tablet Take 40 mg by mouth daily     ??? ALPRAZolam (XANAX) 0.5 MG tablet Take 1 tablet by mouth 2 times daily as needed for Anxiety 60 tablet 0   ??? ondansetron (ZOFRAN) 4 MG tablet Take 1 tablet by mouth daily as needed for Nausea or Vomiting 30 tablet 1   ??? venlafaxine (EFFEXOR XR) 150 MG XR capsule Take 1 capsule by mouth daily 90 capsule 3   ??? nystatin 100000 UNIT/GM POWD APPLY 3 TIMES DAILY 30 g 4   ??? Multiple Vitamins-Minerals (MULTIVITAMIN PO) Take by mouth     ??? CALCIUM-VITAMIN D PO Take by mouth     ??? omeprazole (PRILOSEC) 40 MG capsule Take 1 capsule by mouth daily. 30 capsule 3   ??? cetirizine (ZYRTEC ALLERGY) 10 MG tablet Take 10 mg by mouth daily.       No current  facility-administered medications for this visit.      REVIEW OF SYSTEMS:    CONSTITUTIONAL: + major weight gain ; - fatigue, weakness, night sweats; + fever.  HEENT: No new vision difficulties or ringing in the ears.  RESPIRATORY: + new SOB, - PND, - orthopnea, + cough.   CARDIOVASCULAR: See HPI  GI: No nausea, vomiting, diarrhea, constipation, abdominal pain or changes in bowel habits.  GU: No urinary frequency, urgency, incontinence hematuria or dysuria.  SKIN: No cyanosis or skin lesions.  MUSCULOSKELETAL: No new muscle or joint pain.  NEUROLOGICAL: No syncope or TIA-like symptoms.  PSYCHIATRIC: No anxiety, pain, insomnia or depression    Objective:   PHYSICAL EXAM:        Vitals:    02/21/16 1135 02/21/16 1140   BP: 102/64 126/78   Site: Right Arm Left Arm   Position: Sitting    Cuff Size: Large Adult Large Adult   Pulse: 52    Resp: 20    SpO2: 98%    Weight: (!) 310 lb 4.8 oz (140.8 kg)    Height: '5\' 6"'$  (1.676 m)        ??? BMI 50.08 kg/m2     CONSTITUTIONAL: Cooperative, no apparent distress, and appears well nourished / obese  NEUROLOGIC:  Awake and orientated to person, place and time.  PSYCH: Calm affect.  SKIN: Warm and dry.  HEENT: Sclera non-icteric, normocephalic, neck supple, no elevation of JVP, normal carotid pulses with no bruits and thyroid normal size.  LUNGS:  No increased work of breathing and diminished   CARDIOVASCULAR:  Regular rate 72 and rhythm with no murmurs, gallops, rubs, or abnormal heart sounds, normal PMI.The apical impulses not displaced  JVP less than 8 cm H2O  Heart tones are crisp and normal  Cervical veins are not engorged  The carotid upstroke is normal in amplitude and contour without delay or bruit  JVP is not elevated  ABDOMEN:  Normal bowel sounds, non-distended and non-tender to palpation  EXT: bil LE edema, no calf tenderness. Pulses are present bilaterally.    DATA:    Lab Results   Component Value Date    ALT 85 (H) 12/15/2015    AST 66 (H) 12/15/2015    ALKPHOS 88  12/15/2015    BILITOT 0.3 12/15/2015     Lab Results   Component Value Date    CREATININE 0.7 12/15/2015    BUN 13 12/15/2015    NA 142 12/15/2015    K 3.9 12/15/2015    CL 103 12/15/2015    CO2 27 12/15/2015     Lab Results   Component Value Date    TSH 1.37 08/22/2015    T4TOTAL 6.6 04/17/2014     Lab Results   Component Value Date    WBC 7.7 02/17/2016    HGB 13.7 02/17/2016    HCT 42.5 02/17/2016    MCV 88.4 02/17/2016    PLT 149 02/17/2016     No components found for: CHLPL  Lab Results   Component Value Date    TRIG 137 05/04/2014    TRIG 153 (H) 08/21/2013    TRIG 102 07/24/2013     Lab Results   Component Value Date    HDL 49 05/04/2014    HDL 60 06/30/2013    HDL 62 (H) 03/16/2012     Lab Results   Component Value Date    LDLCALC 132 (H) 05/04/2014    LDLCALC 138 (H) 06/30/2013    LDLCALC 115 (H) 03/16/2012     Lab Results   Component Value Date    LABVLDL 27 05/04/2014    LABVLDL 19 06/30/2013    LABVLDL 18 03/16/2012     proBNP  196      02/17/2016       Radiology Review:  Pertinent images / reports were reviewed as a part of this visit and reveals the following:    02/13/16: CXR  FINDINGS:   Cardiomegaly. ??There appears be pulmonary vascular congestion and   interstitial edema. ??No focal airspace disease.   ??     ??   Impression   Findings suggest congestive heart failure   ??     Angiogram: 06/03/12:  Cath:   Normal coronaries and LVEF     Last Echo: Oct '16:  Summary  ??Technically limited study due to body habitus.  ??Normal left ventricle size and systolic function with an estimated??ejection fraction of 55%. No regional wall motion abnormalities are seen.  ??There is mild concentric left ventricular hypertrophy.    Last Stress Test: Oct '16:  Summary   ??-Moderate sized anterior mostly reversible defect consistent with ischemia   ??in the territory of the mid LAD .   ??-Normal LV function.   ??-Study is limited by breast attenuation.     Cardiac Cath 09/20/15:  Anatomy:   LM-normal  LAD-normal  Cx-normal  OM1-  normal  RCA-normal, codominant  RPDA- normal  LVEF- 60%  ??  Impression:  1. Normal coronaries  2. Normal LV systolic function  3. False positive stress test      Assessment:     1. Other chest pain   ~atypical pain reproducible with palp  ~neg cor disease by cath x2 EKG 12 lead    Sedimentation rate, automated   2. Essential hypertension   ~controlled    3. Shortness of breath   ~CXR suggest HF, proBNP mildly elevated vs viral infx  ~diminshed BS and edema to exam          I had the opportunity to review the clinical symptoms and presentation of Rachael Mays.   Plan:     1. EKG : sinus rhythm, no acute changes  2. ESR  3. Hold HCTZ and  take furosemide 20 mg daily (until next seen) : edema and worsened SOB with mildly elevated proBNP  4. F/U in 2-3 weeks    Overall the patient is stable from CV standpoint    I have addresed the patient's cardiac risk factors and adjusted pharmacologic treatment as needed. In addition, I have reinforced the need for patient directed risk factor modification.    Further evaluation will be based upon the patient's clinical course and testing results.    All questions and concerns were addressed to the patient/family. Alternatives to my treatment were discussed.      The patient is not currently smoking. The risks related to smoking were reviewed with the patient. Recommend maintaining a smoke-free lifestyle. Products available for smoking cessation were discussed in detail.    Patient is not on a beta-blocker: neg MI  Patient is on an ace-i  Patient is not on a statin: neg CAD/hyperlipidemia    Dual Antiplatelet therapy / anti-coagulation has not been recommended / prescribed for this patient.     The patient verbalizes understanding not to stop medications without discussing with [place].    Discussed exercise: 30-60 minutes 7 days/week  Discussed NAS diet.     Thank you for allowing to [place] to participate in the care of Rachael Mays.    Luiz Iron, CNP    Documentation of today's  visit sent to PCP

## 2016-02-24 NOTE — Telephone Encounter (Addendum)
-----   Message from Luiz Iron, NP sent at 02/22/2016  2:39 PM EDT -----  Normal ; notify pt please   unable to leave detailed message per HIPPA   Please advise pt labs are normal

## 2016-03-09 MED ORDER — FUROSEMIDE 20 MG PO TABS
20 MG | ORAL_TABLET | ORAL | 0 refills | Status: DC
Start: 2016-03-09 — End: 2016-03-13

## 2016-03-09 NOTE — Telephone Encounter (Signed)
Last OV 02/21/16  Labs 02/17/16

## 2016-03-10 ENCOUNTER — Encounter: Admit: 2016-03-10 | Primary: Sports Medicine

## 2016-03-10 ENCOUNTER — Emergency Department: Admit: 2016-03-10 | Primary: Sports Medicine

## 2016-03-10 ENCOUNTER — Inpatient Hospital Stay
Admit: 2016-03-10 | Discharge: 2016-03-10 | Disposition: A | Discharge status: Home / Self Care | Payer: PRIVATE HEALTH INSURANCE | Attending: Emergency Medicine

## 2016-03-10 DIAGNOSIS — R079 Chest pain, unspecified: Secondary | ICD-10-CM

## 2016-03-10 LAB — CBC WITH AUTO DIFFERENTIAL
Basophils %: 0.7 %
Basophils Absolute: 0 10*3/uL (ref 0.0–0.2)
Eosinophils %: 6.3 %
Eosinophils Absolute: 0.5 10*3/uL (ref 0.0–0.6)
Hematocrit: 41.6 % (ref 36.0–48.0)
Hemoglobin: 13.5 g/dL (ref 12.0–16.0)
Lymphocytes %: 33.6 %
Lymphocytes Absolute: 2.5 10*3/uL (ref 1.0–5.1)
MCH: 28.4 pg (ref 26.0–34.0)
MCHC: 32.5 g/dL (ref 31.0–36.0)
MCV: 87.5 fL (ref 80.0–100.0)
MPV: 9.6 fL (ref 5.0–10.5)
Monocytes %: 9.5 %
Monocytes Absolute: 0.7 10*3/uL (ref 0.0–1.3)
Neutrophils %: 49.9 %
Neutrophils Absolute: 3.7 10*3/uL (ref 1.7–7.7)
Platelets: 149 10*3/uL (ref 135–450)
RBC: 4.76 M/uL (ref 4.00–5.20)
RDW: 14.4 % (ref 12.4–15.4)
WBC: 7.4 10*3/uL (ref 4.0–11.0)

## 2016-03-10 LAB — BASIC METABOLIC PANEL
Anion Gap: 9 (ref 3–16)
BUN: 9 mg/dL (ref 7–20)
CO2: 29 mmol/L (ref 21–32)
Calcium: 8.7 mg/dL (ref 8.3–10.6)
Chloride: 104 mmol/L (ref 99–110)
Creatinine: 0.7 mg/dL (ref 0.6–1.1)
GFR African American: >60 (ref >60)
GFR Non-African American: >60 (ref >60)
Glucose: 111 mg/dL — ABNORMAL HIGH (ref 70–99)
Potassium: 3.7 mmol/L (ref 3.5–5.1)
Sodium: 142 mmol/L (ref 136–145)

## 2016-03-10 LAB — PROTIME-INR
INR: 1.06 (ref 0.85–1.15)
Protime: 12 s (ref 9.6–13.0)

## 2016-03-10 LAB — RAPID INFLUENZA A/B ANTIGENS
Rapid Influenza A Ag: NEGATIVE
Rapid Influenza B Ag: NEGATIVE

## 2016-03-10 LAB — APTT: aPTT: 26.5 s (ref 21.0–31.8)

## 2016-03-10 LAB — TROPONIN
Troponin: <0.01 ng/mL (ref <0.01)
Troponin: <0.01 ng/mL (ref <0.01)

## 2016-03-10 LAB — D-DIMER, QUANTITATIVE: D-Dimer, Quant: 461 ng/mL DDU — ABNORMAL HIGH (ref 0–229)

## 2016-03-10 LAB — BRAIN NATRIURETIC PEPTIDE: Pro-BNP: 558 pg/mL — ABNORMAL HIGH (ref 0–124)

## 2016-03-10 MED ORDER — IOPAMIDOL 76 % IV SOLN
76 % | Freq: Once | INTRAVENOUS | Status: AC | PRN
Start: 2016-03-10 — End: 2016-03-10
  Administered 2016-03-10: 21:00:00 100 mL via INTRAVENOUS

## 2016-03-10 MED ORDER — SODIUM CHLORIDE 0.9 % IV BOLUS
0.9 % | Freq: Once | INTRAVENOUS | Status: AC
Start: 2016-03-10 — End: 2016-03-10
  Administered 2016-03-10: 20:00:00 500 mL via INTRAVENOUS

## 2016-03-10 MED ORDER — PROCHLORPERAZINE EDISYLATE 5 MG/ML IJ SOLN
5 MG/ML | Freq: Once | INTRAMUSCULAR | Status: AC
Start: 2016-03-10 — End: 2016-03-10
  Administered 2016-03-10: 20:00:00 10 mg via INTRAVENOUS

## 2016-03-10 MED ORDER — KETOROLAC TROMETHAMINE 30 MG/ML IJ SOLN
30 MG/ML | Freq: Once | INTRAMUSCULAR | Status: AC
Start: 2016-03-10 — End: 2016-03-10
  Administered 2016-03-10: 20:00:00 30 mg via INTRAVENOUS

## 2016-03-10 MED ORDER — DIPHENHYDRAMINE HCL 50 MG/ML IJ SOLN
50 MG/ML | Freq: Once | INTRAMUSCULAR | Status: AC
Start: 2016-03-10 — End: 2016-03-10
  Administered 2016-03-10: 20:00:00 25 mg via INTRAVENOUS

## 2016-03-10 MED ORDER — SODIUM CHLORIDE 0.9 % IV SOLN
0.9 % | INTRAVENOUS | Status: DC
Start: 2016-03-10 — End: 2016-03-10

## 2016-03-10 MED FILL — ISOVUE-370 76 % IV SOLN: 76 % | INTRAVENOUS | Qty: 100

## 2016-03-10 MED FILL — SODIUM CHLORIDE 0.9 % IV SOLN: 0.9 % | INTRAVENOUS | Qty: 1000

## 2016-03-10 MED FILL — PROCHLORPERAZINE EDISYLATE 5 MG/ML IJ SOLN: 5 MG/ML | INTRAMUSCULAR | Qty: 2

## 2016-03-10 MED FILL — KETOROLAC TROMETHAMINE 30 MG/ML IJ SOLN: 30 MG/ML | INTRAMUSCULAR | Qty: 1

## 2016-03-10 MED FILL — DIPHENHYDRAMINE HCL 50 MG/ML IJ SOLN: 50 MG/ML | INTRAMUSCULAR | Qty: 1

## 2016-03-10 NOTE — Telephone Encounter (Signed)
See below

## 2016-03-10 NOTE — Telephone Encounter (Signed)
Pt called stating she has SOB, chest feels tight, legs are weak, HA and blurry vision    Pt was advised to call 911 - stated her husband has to be at work at 3 and works at the hospital she will hurry up and get ready and have him go in early

## 2016-03-10 NOTE — ED Notes (Signed)
Pt states understanding of discharge instructions. Pt agrees to follow up with referral. Pt denies any further needs at this time.      Sheilah Pigeon, RN  03/10/16 [id number]

## 2016-03-10 NOTE — ED Provider Notes (Signed)
[place] [facility]  eMERGENCY dEPARTMENT eNCOUnter        Pt Name: Rachael Mays  MRN: [id number]  [place] 06-05-1956  Date of evaluation: 03/10/2016  Provider: Bess Harvest, PA-C  PCP: Harvel Quale, MD  ED Attending: Molli Barrows, MD    [place]       Chief Complaint   Patient presents with   ??? Chest Pain     pt c/o chest pain, sob, headache x 2 days       HISTORY OF PRESENT ILLNESS   (Location/Symptom, Timing/Onset, Context/Setting, Quality, Duration, Modifying Factors, Severity)  Note limiting factors.     Rachael Mays is a 60 y.o. female  with a history of hypertension, hyperlipidemia, asthma, OSA, DVT, PE, and chronic pain who presents to the emergency department tonight complaining of chest pain, shortness of breath, headache and fevers and chills.  She states she has had chest pain intermittently for the last week with shortness of breath.  She describes her pain as a sharp, constant, 7/10 pain that localizes to her midsternum and radiates to her left chest.  She denies modifying factors.  She denies coughing.  She has had no recent travel.  She states she has had a temperature as high as 102.7??F however is nonfebrile here.  She is hemodynamically stable and oxygenating well.  She states she began having a headache today that she rates a 9/10.  It is a frontal headache.  She denies this being the worst headache of her life and states headache was gradual onset.  She denies lightheadedness or dizziness.  She has no GI or urinary complaints.    Nursing Notes were all reviewed and agreed with or any disagreements were addressed  in the HPI.    REVIEW OF SYSTEMS    (2-9 systems for level 4, 10 or more for level 5)     Review of Systems   Constitutional: Positive for fever. Negative for chills, diaphoresis and fatigue.   Respiratory: Positive for shortness of breath. Negative for cough, chest tightness, wheezing and stridor.    Cardiovascular: Positive for chest pain.  Negative for palpitations and leg swelling.   Gastrointestinal: Negative for abdominal pain, diarrhea, nausea and vomiting.   Genitourinary: Negative for difficulty urinating, dysuria, flank pain, frequency, hematuria, urgency, vaginal bleeding, vaginal discharge and vaginal pain.   Musculoskeletal: Negative for arthralgias, back pain, neck pain and neck stiffness.   Neurological: Positive for headaches. Negative for dizziness, seizures, facial asymmetry, speech difficulty, weakness, light-headedness and numbness.   All other systems reviewed and are negative.      Positives and Pertinent negatives as per HPI.  Except as noted above in the ROS, all other systems were reviewed and negative.       PAST MEDICAL HISTORY     Past Medical History:   Diagnosis Date   ??? Anxiety    ??? Asthma    ??? Chronic back pain    ??? Deep vein thrombosis    ??? Depression    ??? GERD (gastroesophageal reflux disease)     NO LONGER SINCE LAP   ??? GERD (gastroesophageal reflux disease) 01/23/2009   ??? Hyperlipidemia     hx; resolved with lap band   ??? Hypertension    ??? Obesity     hx of; had lap band   ??? Obstructive sleep apnea (adult) (pediatric) 09/18/2015   ??? Pulmonary embolism ([place])  SURGICAL HISTORY       Past Surgical History:   Procedure Laterality Date   ??? BACK SURGERY      neck  plates   ??? CESAREAN SECTION     ??? CHOLECYSTECTOMY     ??? COLONOSCOPY     ??? COLONOSCOPY  04/08/10   ??? HYSTERECTOMY     ??? LAP BAND  05/01/08    Dr. Nicole Cella   ??? OTHER SURGICAL HISTORY      [place] Filter: curently in place   ??? OTHER SURGICAL HISTORY  07/05/13    lap band removal   ??? PARTIAL HYSTERECTOMY     ??? SHOULDER SURGERY     ??? UPPER GASTROINTESTINAL ENDOSCOPY  05/19/13   ??? UPPER GASTROINTESTINAL ENDOSCOPY  07/21/13    ESOPHAGEAL STENT PLACEMENT   ??? UPPER GASTROINTESTINAL ENDOSCOPY N/A 07/31/2014    Esophagogastroduodenoscopy with esophageal balloon dilation   ??? VASCULAR SURGERY  04/2015    Lucendia Herrlich, celiac artery angiogram, normal abd arteries         CURRENT  MEDICATIONS       Discharge Medication List as of 03/10/2016  7:13 PM      CONTINUE these medications which have NOT CHANGED    Details   furosemide (LASIX) 20 MG tablet TAKE ONE TABLET BY MOUTH DAILY, Disp-5 tablet, R-0Normal      traZODone (DESYREL) 100 MG tablet TAKE 1 TABLET BY MOUTH NIGHTLY, Disp-90 tablet, R-1      albuterol sulfate HFA (PROVENTIL HFA) 108 (90 BASE) MCG/ACT inhaler Inhale 2 puffs into the lungs every 6 hours as needed for Wheezing, Disp-3 Inhaler, R-3      Blood Pressure Monitor KIT Disp-1 kit, R-0, NormalUse daily as needed      cyclobenzaprine (FLEXERIL) 10 MG tablet Take 1 tablet by mouth 3 times daily as needed for Muscle spasms, Disp-30 tablet, R-0      HYDROcodone-acetaminophen (NORCO) 5-325 MG per tablet Take 1 tablet by mouth 4 times daily as needed (pain) ., Disp-15 tablet, R-0      fluocinonide (LIDEX) 0.05 % cream APPLY TOPICALLY TWICE A DAY TO AFFECTED AREA, Disp-15 g, R-1, Normal      hydrALAZINE (APRESOLINE) 25 MG tablet Take 1 tablet by mouth 3 times daily, Disp-270 tablet, R-3      metoprolol (LOPRESSOR) 100 MG tablet Take 1.5 tablets by mouth 2 times daily, Disp-270 tablet, R-0      montelukast (SINGULAIR) 10 MG tablet TAKE 1 TABLET BY MOUTH DAILY, Disp-90 tablet, R-1      ALPRAZolam (XANAX) 0.5 MG tablet Take 1 tablet by mouth 2 times daily as needed for Anxiety, Disp-60 tablet, R-0      ondansetron (ZOFRAN) 4 MG tablet Take 1 tablet by mouth daily as needed for Nausea or Vomiting, Disp-30 tablet, R-1      venlafaxine (EFFEXOR XR) 150 MG XR capsule Take 1 capsule by mouth daily, Disp-90 capsule, R-3      nystatin 100000 UNIT/GM POWD APPLY 3 TIMES DAILY, Disp-30 g, R-4      Multiple Vitamins-Minerals (MULTIVITAMIN PO) Take by mouth      CALCIUM-VITAMIN D PO Take by mouth      omeprazole (PRILOSEC) 40 MG capsule Take 1 capsule by mouth daily., Disp-30 capsule, R-3      cetirizine (ZYRTEC ALLERGY) 10 MG tablet Take 10 mg by mouth daily.               ALLERGIES     Morphine and  Talwin [pentazocine]  FAMILY HISTORY       Family History   Problem Relation Age of Onset   ??? Arthritis Mother    ??? Cancer Mother    ??? Depression Mother    ??? High Blood Pressure Mother    ??? Heart Disease Mother 49     MI    ??? Ovarian Cancer Mother 106   ??? Cancer Father 74     colon   ??? Heart Disease Maternal Grandmother    ??? Early Death Paternal Grandmother    ??? Heart Disease Paternal Grandfather    ??? Diabetes Other    ??? High Blood Pressure Other    ??? Obesity Other    ??? Other Sister      OSA   ??? Other Brother      OSA   ??? Other Brother      OSA   ??? Other Daughter      OSA          SOCIAL HISTORY       Social History     Social History   ??? Marital status: Married     Spouse name: N/A   ??? Number of children: N/A   ??? Years of education: N/A     Social History Main Topics   ??? Smoking status: Former Smoker     Packs/day: 0.50     Years: 40.00     Types: Cigarettes     Quit date: 08/07/2012   ??? Smokeless tobacco: Never Used      Comment: started to smoke at age 11 / only smoked 0.5 p.p.d    ??? Alcohol use 0.0 oz/week     0 Standard drinks or equivalent per week      Comment: occasionally    ??? Drug use: No   ??? Sexual activity: No     Other Topics Concern   ??? None     Social History Narrative    ** Merged History Encounter **            SCREENINGS             PHYSICAL EXAM    (up to 7 for level 4, 8 or more for level 5)   ED Triage Vitals   BP Temp Temp Source Pulse Resp SpO2 Height Weight   03/10/16 1510 03/10/16 1510 03/10/16 1510 03/10/16 1510 03/10/16 1510 03/10/16 1510 03/10/16 1508 03/10/16 1508   158/81 98.2 ??F (36.8 ??C) Oral 62 20 94 % _0  (1.702 m) 311 lb 5 oz (141.2 kg)       Physical Exam   Constitutional: She is oriented to person, place, and time. She appears well-developed and well-nourished. No distress.   HENT:   Head: Normocephalic and atraumatic.   Eyes: Conjunctivae and EOM are normal. Pupils are equal, round, and reactive to light. Right eye exhibits no discharge. Left eye exhibits no discharge.   Neck:  Normal range of motion. Neck supple. No JVD present.   Cardiovascular: Normal rate, regular rhythm, normal heart sounds and intact distal pulses.  Exam reveals no gallop and no friction rub.    No murmur heard.  Pulmonary/Chest: Effort normal and breath sounds normal. No respiratory distress. She has no wheezes. She has no rales.   Abdominal: Soft. Bowel sounds are normal. She exhibits no distension. There is no tenderness. There is no rebound and no guarding.   Musculoskeletal: Normal range of motion. She exhibits no edema, tenderness or deformity.  Lymphadenopathy:     She has no cervical adenopathy.   Neurological: She is alert and oriented to person, place, and time. She has normal strength. No cranial nerve deficit or sensory deficit. Coordination and gait normal. GCS eye subscore is 4. GCS verbal subscore is 5. GCS motor subscore is 6.   Skin: Skin is warm and dry. She is not diaphoretic. No pallor.   Psychiatric: She has a normal mood and affect. Her behavior is normal.   Nursing note and vitals reviewed.      DIAGNOSTIC RESULTS   LABS:    Labs Reviewed   BASIC METABOLIC PANEL - Abnormal; Notable for the following:        Result Value    Glucose 111 (*)     All other components within normal limits   BRAIN NATRIURETIC PEPTIDE - Abnormal; Notable for the following:     Pro-BNP 558 (*)     All other components within normal limits   D-DIMER, QUANTITATIVE - Abnormal; Notable for the following:     D-Dimer, Quant 461 (*)     All other components within normal limits   RAPID INFLUENZA A/B ANTIGENS   CBC WITH AUTO DIFFERENTIAL   TROPONIN   APTT   PROTIME-INR   TROPONIN       All other labs were within normal range or not returned as of this dictation.    EKG: All EKG's are interpreted by the Emergency Department Physician who either signs or Co-signs this chart in the absence of a cardiologist.  Please see their note for interpretation of EKG.    Please see attending physician's note    RADIOLOGY:   Non-plain film  images such as CT, Ultrasound and MRI are read by the radiologist. Plain radiographic images are visualized and preliminarily interpreted by the  ED Provider with the below findings:    Interpretation per the Radiologist below, if available at the time of this note:    CTA Pulmonary With Contrast   Final Result   No evidence of pulmonary embolus.      Small right and trace left pleural effusions, new as well as interlobular   septal wall thickening.  Findings likely related to edema.      Mild mediastinal adenopathy, new.  Findings may be reactive.  Recommend   follow-up to ensure resolution.         XR Chest Standard TWO VW   Final Result   No acute process.           Xr Chest Standard Two Vw    Result Date: 03/10/2016  EXAMINATION: TWO VIEWS OF THE CHEST 03/10/2016 3:22 pm COMPARISON: 02/13/2016 HISTORY: ORDERING SYSTEM PROVIDED HISTORY: chest pain , sob TECHNOLOGIST PROVIDED HISTORY: Ordering Physician Provided Reason for Exam: pt c/o chest pain, sob, headache x 2 days Acuity: Acute Type of Encounter: Initial FINDINGS: Cervical spinal hardware is noted. The lungs are without acute focal process.  There is no effusion or pneumothorax. The cardiomediastinal silhouette is without acute process. The osseous structures are without acute process.     No acute process.         PROCEDURES   Unless otherwise noted below, none     Procedures    CRITICAL CARE TIME   N/A    CONSULTS:  None      EMERGENCY DEPARTMENT COURSE and DIFFERENTIAL DIAGNOSIS/MDM:   Vitals:    Vitals:    03/10/16 1832 03/10/16 1847 03/10/16 1902 03/10/16 5277  BP: (!) 143/74 (!) 141/78 (!) 155/76 (!) 158/80   Pulse: 57 58 60 56   Resp: '19 18 19 16   '$ Temp:       TempSrc:       SpO2: 91% 92% 91% 93%   Weight:       Height:           Patient was given the following medications:  Medications   0.9 % sodium chloride bolus (0 mLs Intravenous Stopped 03/10/16 1721)   ketorolac (TORADOL) injection 30 mg (30 mg Intravenous Given 03/10/16 1614)   prochlorperazine  (COMPAZINE) injection 10 mg (10 mg Intravenous Given 03/10/16 1614)   diphenhydrAMINE (BENADRYL) injection 25 mg (25 mg Intravenous Given 03/10/16 1614)   iopamidol (ISOVUE-370) 76 % injection 100 mL (100 mLs Intravenous Given 03/10/16 1717)     Patient with a history of hypertension, hyperlipidemia, asthma, OSA, DVT, PE, and chronic pain and presented to the emergency department tonight complaining of chest pain and shortness of breath she has had for one week intermittently but has been constant since yesterday.  She denies recent cough but states she had a fever of 102.7??F recently.  She has had no recent travel but does have a history of DVT and PE.  She has no GI complaints.  She states she did start having a headache today that she rates a 9/10.  She denies this being the worst headache of her life.  She is alert and oriented ??3 with GCS 15 and has a normal neurologic exam.  Her EKG is negative for signs of acute ischemia or infarction.  Troponin <0.01.  ProBNP is mildly elevated at 558 and chest x-ray shows no acute cardiopulmonary disease. Her d-dimer is elevated at 461 and CTA pulmonary shows no evidence of pulmonary emboli.  CBC, BMP and coags are unremarkable.  Rapid influenza is negative.  Patient is given fluids along with Toradol, Compazine and Benadryl. Patient had a normal cath in September 2016.    As this is the end of my shift the attending physician will continue care of this patient. Rachael Mays was signed out in stable condition. Please see Molli Barrows, MD note for further details, including diagnosis and disposition.    FINAL IMPRESSION      1. Chest pain, unspecified type          DISPOSITION/PLAN   DISPOSITION     PATIENT REFERRED TO:  Harvel Quale, [place] [address]  [phone]    In 2 days      [facility]  [place] [address]  [phone]    As needed, If symptoms worsen      DISCHARGE MEDICATIONS:  Discharge Medication  List as of 03/10/2016  7:13 PM          DISCONTINUED MEDICATIONS:  Discharge Medication List as of 03/10/2016  7:13 PM      STOP taking these medications       hydrochlorothiazide (HYDRODIURIL) 25 MG tablet Comments:   Reason for Stopping:         lisinopril (PRINIVIL;ZESTRIL) 40 MG tablet Comments:   Reason for Stopping:                      (Please note that portions of this note were completed with a voice recognition program.  Efforts were made to edit the dictations but occasionally words are mis-transcribed.)  Bess Harvest, PA-C (electronically signed)           Bess Harvest, PA-C  03/16/16 [phone]

## 2016-03-10 NOTE — ED Provider Notes (Signed)
I independently performed a history and physical on Rachael Mays. All diagnostic, treatment, and disposition decisions were made by myself in conjunction with the advanced practice provider. I have participated in the medical decision making and directed the treatment plan and disposition of the patient.     For further details of Rachael Mays [facility] emergency department encounter, please see the advanced practice provider's Abran Duke  documentation.     CHIEF COMPLAINT:  Chief Complaint   Patient presents with   ??? Chest Pain     pt c/o chest pain, sob, headache x 2 days        HPI:  Briefly, Rachael Mays is a 60 y.o. female who presents to the ED complaining of chest pain.  This been intermittent for many weeks.  Although has been worse over last 2 days.  Associated with shortness of breath.  The pain radiates into the left breast and shoulder, now up into the neck.  It is moderate to severe.  She is nose no aggravating or alleviating factors.  She was threaded Xanax and did not relieve it.  She had seen her private care doctor previously, and she was told "nothing".  She's also had some nausea, but no vomiting.  Per the daughter she's had decreased oral intake.  She reports bilateral leg swelling as well as hand swelling.  She's had a remote history of blood clots, that was instigated by estrogen, and she is not currently on any Coumadin.  This was in the 80s.  She has history of hypertension, no cholesterolemia, no diabetes.  She has no personal significant cardiac history, but her mom did have an MI.     FOCUSED PHYSICAL EXAMINATION   Visit Vitals   ??? BP (!) 158/80   ??? Pulse 56   ??? Temp 98.2 ??F (36.8 ??C) (Oral)   ??? Resp 16   ??? Ht 5\' 7"  (1.702 m)   ??? Wt (!) 311 lb 5 oz (141.2 kg)   ??? SpO2 93%   ??? BMI 48.76 kg/m2        Patient is in no acute distress.  She is obese.  Lungs are clear to auscultation throughout all lung fields.  Cardiac exam reveals a normal rate and regular rhythm, with no significant venous  distention, and intact radial pulses.  Abdomen soft, nontender, nondistended.  There is very mild lower extremity edema, without discoloration, erythema, warmth.  Neither leg is tender.      EKG:  12- lead ECG reveals normal sinus rhythm, normal axis, normal PR and QTc intervals, normal QRS duration, no T wave abnormalities, and no evidence of ST segment elevation/depression.  This compared to previous EKG from December 15, 2015, without significant changes    MDM:   60 year old female presenting with chest pain going on for last 2 weeks or more, and worse over last 2 days.  She presents with normal vital signs, and is in no acute distress.This is a patient that presents with symptoms concerning for ACS.  Workup is undertaken that includes ECG and troponin.  We will also assess for electrolyte or hematologic disturbance.  Chest Xray is obtained to rule out pneumonia, pneumothorax, pulmonary edema, or indirect signs of acute cardiopulmonary pathology. At this time, based on the patient's signs and symptoms, my clinical suspicion of other emergent causes of chest pain such as aortic dissection, cardiac tamponade, esophageal rupture are low and do not require specific additional testing.  Based on this patient's signs,  symptoms, and vital signs, pulmonary embolus is a considered diagnosis.  They are low risk by Well's criteria, but cannot be adequately and safely ruled out using the [facility] risk stratification. D-dimer is ordered, and is positive.  CT of the chest performed, and reveals no evidence of pulmonary embolus.    ECG shows no acute changes, and initial troponin is negative.   CBC reveals no significant evidence of leukocytosis, anemia, or thrombocytopenia.  Basic metabolic panel reveals no signficiant electrolyte abnormalities, there is normal renal function, and glucose level that does not require intervention.  Her BNP is elevated at 558 which is higher than previous levels 100s and 200s.   Chest x-ray reveals  no evidence of pulmonary edema or consolidation.  So far, there is no specific cause of her chest pain identified.  Due to her risk factors, we will perform a delta troponin, at 3 hours.  This remains negative.  The patient's chart is reviewed, she did have a normal cardiac cath in October 2016, after an abnormal stress test.  Further testing with stress testing at this time I don't feel would be beneficial for the patient as she previously has an abnormal stress test with a normal cardiac cath.  I do discuss the results with the patient at length, and stressed it is imperative that she follows up with her primary care physician and cardiologist, to evaluate for any further necessary testing, to both evaluate for any additional cardiac causes, or other additional sources of chest pain that we're unable to identify here in the emergency department.  The patient remains here dynamically stable throughout this process, and is stable for discharge.  Questions were solicited and answered in full.  Diagnosis and discharge plan were discussed with the patient/family.  The patient is instructed to follow up with primary care physician in 2-3 days. Strict return precautions are given if symptoms persist, worsen, or new concerning symptoms develop.               During the patient's ED course, the patient was given:   Medications   0.9 % sodium chloride bolus (0 mLs Intravenous Stopped 03/10/16 1721)   ketorolac (TORADOL) injection 30 mg (30 mg Intravenous Given 03/10/16 1614)   prochlorperazine (COMPAZINE) injection 10 mg (10 mg Intravenous Given 03/10/16 1614)   diphenhydrAMINE (BENADRYL) injection 25 mg (25 mg Intravenous Given 03/10/16 1614)   iopamidol (ISOVUE-370) 76 % injection 100 mL (100 mLs Intravenous Given 03/10/16 1717)        CLINICAL IMPRESSION   1. Chest pain, unspecified type         DISPOSITION   DISPOSITION Decision to Discharge    This chart was created using Dragon dictation software. Efforts were made by me to  ensure accuracy, however some errors may be present due to limitations of this technology.       Molli Barrows, MD  03/11/16 [phone]

## 2016-03-10 NOTE — ED Notes (Signed)
Troponin drawn and sent. Pt requesting update. Dr. Mickie Hillier made aware.      Luster Landsberg, RN  03/10/16 [phone]

## 2016-03-11 NOTE — Care Coordination-Inpatient (Signed)
Ambulatory Care Coordination  ED Follow up Call    Reason for ED visit:  61 y.o. female who presents to the ED complaining of chest pain. This been intermittent for many weeks. Although has been worse over last 2 days. Associated with shortness of breath. The pain radiates into the left breast and shoulder, now up into the neck. It is moderate to severe.          Outgoing telephone call, no answer. Left message to call. Reminded to call the office for a follow up appointment.      FU appts/Provider:    Future Appointments  Date Time Provider [place]   03/13/2016 11:30 AM Luiz Iron, NP [facility] Cardio [facility]   03/23/2016 11:00 AM Francisca December, MD [facility]   05/05/2016 3:30 PM Tiffanie Rita Ohara, CNP [facility] SLEEP [facility]           Lambert Mody RN,BSN  Nurse Care Coordinator  [phone]  [place]  [facility]  [email or web address]

## 2016-03-11 NOTE — Telephone Encounter (Signed)
Agree with ER evaluation

## 2016-03-12 ENCOUNTER — Encounter

## 2016-03-13 ENCOUNTER — Ambulatory Visit
Admit: 2016-03-13 | Discharge: 2016-03-13 | Payer: PRIVATE HEALTH INSURANCE | Attending: Adult Health | Primary: Sports Medicine

## 2016-03-13 DIAGNOSIS — R0602 Shortness of breath: Secondary | ICD-10-CM

## 2016-03-13 LAB — EKG 12-LEAD
Atrial Rate: 66 {beats}/min
Diagnosis: NORMAL
P Axis: 2 degrees
P-R Interval: 134 ms
Q-T Interval: 438 ms
QRS Duration: 76 ms
QTc Calculation (Bazett): 459 ms
R Axis: -1 degrees
T Axis: 24 degrees
Ventricular Rate: 66 {beats}/min

## 2016-03-13 MED ORDER — FUROSEMIDE 40 MG PO TABS
40 | ORAL_TABLET | Freq: Every day | ORAL | 2 refills | Status: DC
Start: 2016-03-13 — End: 2016-03-27

## 2016-03-13 NOTE — Progress Notes (Signed)
[place]     Outpatient Follow Up Note    Rachael Mays is 60 y.o. female who presents today with a history of non-cardiac CP, abnormal myoview stress with normal epicardial arteries by cath Oct '16 and HTN. Her other history includes OSA       ER visit: 03/10/16:  chest pain going on for last 2 weeks or more, and worse over last 2 days. She presents with normal vital signs, and is in no acute distress.This is a patient that presents with symptoms concerning for ACS. Workup is undertaken that includes ECG and troponin. We will also assess for electrolyte or hematologic disturbance. Chest Xray is obtained to rule out pneumonia, pneumothorax, pulmonary edema, or indirect signs of acute cardiopulmonary pathology.   Based on this patient's signs, symptoms, and vital signs, pulmonary embolus is a considered diagnosis. They are low risk by Well's criteria, but cannot be adequately and safely ruled out using the [facility] risk stratification. D-dimer is ordered, and is positive. CT of the chest performed, and reveals no evidence of pulmonary embolus.   ECG shows no acute changes, and initial troponin is negative.   Her BNP is elevated at 558 which is higher than previous levels 100s and 200s.   Chest x-ray reveals no evidence of pulmonary edema or consolidation.  So far, there is no specific cause of her chest pain identified. Due to her risk factors, we will perform a delta troponin, at 3 hours. This remains negative.  The patient's chart is reviewed, she did have a normal cardiac cath in October 2016, after an abnormal stress test. Further testing with stress testing at this time I don't feel would be beneficial for the patient as she previously has an abnormal stress test with a normal cardiac cath. I do discuss the results with the patient at length, and stressed it is imperative that she follows up with her primary care physician and cardiologist, to evaluate for any further necessary testing, to both evaluate  for any additional cardiac causes, or other additional sources of chest pain that we're unable to identify here in the emergency department.  The patient remains here dynamically stable throughout this process, and is stable for discharge.  Questions were solicited and answered in full. Diagnosis and discharge plan were discussed with the patient/family. The patient is instructed to follow up with primary care physician in 2-3 days. Strict return precautions are given if symptoms persist, worsen, or new concerning symptoms develop.     CHIEF COMPLAINT / HPI:  Follow Up secondary to SOB and CP. Her BNP was 558 up from 196 and ESR 17. Her HCTZ was changed to low dose lasix. It had help her swelling but not her SOB.   She recalls going to the ER because she couldn't breath.     Subjective:   She continues to have SOB with activities. She has an ache that starts in her left chest and travels around to her left arm / axilla. It lingers on. It is brought on when she tries to take a breath. She is SOB walking through her house described as a small living area. She sleeps with 2 pillows and her BiPAP. She denies PND.  She's been afebrile. She no longer has tenderness in her chest when touching. The swelling in her legs is pretty much gone to down. The patient is not experiencing palpitations. She's been having a lot of dizziness and loses her balance.  She follows with Dr.  Hajjar. The patients weight is up. She had a lap ban removal after poor outcomes '13.     These symptoms have not changed since the last OV.   With regard to medication therapy the patient has been compliant with prescribed regimen. They have tolerated therapy to date.     Past Medical History   Diagnosis Date   ??? Anxiety    ??? Asthma    ??? Chronic back pain    ??? Deep vein thrombosis    ??? Depression    ??? GERD (gastroesophageal reflux disease)      NO LONGER SINCE LAP   ??? GERD (gastroesophageal reflux disease) 01/23/2009   ??? Hyperlipidemia      hx; resolved  with lap band   ??? Hypertension    ??? Obesity      hx of; had lap band   ??? Obstructive sleep apnea (adult) (pediatric) 09/18/2015   ??? Pulmonary embolism ([place])      Social History:    History   Smoking Status   ??? Former Smoker   ??? Packs/day: 0.50   ??? Years: 40.00   ??? Types: Cigarettes   ??? Quit date: 08/07/2012   Smokeless Tobacco   ??? Never Used     Comment: started to smoke at age 62 / only smoked 0.5 p.p.d      Current Medications:  Current Outpatient Prescriptions   Medication Sig Dispense Refill   ??? furosemide (LASIX) 20 MG tablet TAKE ONE TABLET BY MOUTH DAILY 5 tablet 0   ??? traZODone (DESYREL) 100 MG tablet TAKE 1 TABLET BY MOUTH NIGHTLY 90 tablet 1   ??? albuterol sulfate HFA (PROVENTIL HFA) 108 (90 BASE) MCG/ACT inhaler Inhale 2 puffs into the lungs every 6 hours as needed for Wheezing 3 Inhaler 3   ??? Blood Pressure Monitor KIT Use daily as needed 1 kit 0   ??? cyclobenzaprine (FLEXERIL) 10 MG tablet Take 1 tablet by mouth 3 times daily as needed for Muscle spasms 30 tablet 0   ??? HYDROcodone-acetaminophen (NORCO) 5-325 MG per tablet Take 1 tablet by mouth 4 times daily as needed (pain) . 15 tablet 0   ??? fluocinonide (LIDEX) 0.05 % cream APPLY TOPICALLY TWICE A DAY TO AFFECTED AREA 15 g 1   ??? hydrALAZINE (APRESOLINE) 25 MG tablet Take 1 tablet by mouth 3 times daily 270 tablet 3   ??? metoprolol (LOPRESSOR) 100 MG tablet Take 1.5 tablets by mouth 2 times daily 270 tablet 0   ??? montelukast (SINGULAIR) 10 MG tablet TAKE 1 TABLET BY MOUTH DAILY 90 tablet 1   ??? ALPRAZolam (XANAX) 0.5 MG tablet Take 1 tablet by mouth 2 times daily as needed for Anxiety 60 tablet 0   ??? ondansetron (ZOFRAN) 4 MG tablet Take 1 tablet by mouth daily as needed for Nausea or Vomiting 30 tablet 1   ??? venlafaxine (EFFEXOR XR) 150 MG XR capsule Take 1 capsule by mouth daily 90 capsule 3   ??? nystatin 100000 UNIT/GM POWD APPLY 3 TIMES DAILY 30 g 4   ??? Multiple Vitamins-Minerals (MULTIVITAMIN PO) Take by mouth     ??? CALCIUM-VITAMIN D PO Take by mouth      ??? omeprazole (PRILOSEC) 40 MG capsule Take 1 capsule by mouth daily. 30 capsule 3   ??? cetirizine (ZYRTEC ALLERGY) 10 MG tablet Take 10 mg by mouth daily.       No current facility-administered medications for this visit.      REVIEW OF  SYSTEMS:    CONSTITUTIONAL: + weight gain ; - fatigue, weakness, night sweats; + fever.  HEENT: No new vision difficulties or ringing in the ears.  RESPIRATORY: +  SOB, - PND, - orthopnea, + cough.   CARDIOVASCULAR: See HPI  GI: No nausea, vomiting, diarrhea, constipation, abdominal pain or changes in bowel habits.  GU: No urinary frequency, urgency, incontinence hematuria or dysuria.  SKIN: No cyanosis or skin lesions.  MUSCULOSKELETAL: No new muscle or joint pain.  NEUROLOGICAL: No syncope or TIA-like symptoms.  PSYCHIATRIC: No anxiety, pain, insomnia or depression    Objective:   PHYSICAL EXAM:        Vitals:    03/13/16 1152 03/13/16 1213   BP: 108/64 132/72   Site: Right Arm Right Arm   Position: Sitting    Cuff Size: Large Adult Large Adult   Pulse: 68    SpO2: 97%    Weight: (!) 313 lb (142 kg)    Height: '5\' 6"'  (1.676 m)        ??? BMI 50.08 kg/m2     CONSTITUTIONAL: Cooperative, no apparent distress, and appears well nourished / obese  NEUROLOGIC:  Awake and orientated to person, place and time.  PSYCH: Calm affect.  SKIN: Warm and dry.  HEENT: Sclera non-icteric, normocephalic, neck supple, no elevation of JVP, normal carotid pulses with no bruits and thyroid normal size.  LUNGS:  No increased work of breathing and diminished   CARDIOVASCULAR:  Regular rate 64 and rhythm with no murmurs, gallops, rubs, or abnormal heart sounds, normal PMI.The apical impulses not displaced  JVP less than 8 cm H2O  Heart tones are crisp and normal  Cervical veins are not engorged  The carotid upstroke is normal in amplitude and contour without delay or bruit  JVP is not elevated  ABDOMEN:  Normal bowel sounds, non-distended and non-tender to palpation  EXT: bil LE edema, no calf tenderness.  Pulses are present bilaterally.    DATA:    Lab Results   Component Value Date    ALT 85 (H) 12/15/2015    AST 66 (H) 12/15/2015    ALKPHOS 88 12/15/2015    BILITOT 0.3 12/15/2015     Lab Results   Component Value Date    CREATININE 0.7 03/10/2016    BUN 9 03/10/2016    NA 142 03/10/2016    K 3.7 03/10/2016    CL 104 03/10/2016    CO2 29 03/10/2016     Lab Results   Component Value Date    TSH 1.37 08/22/2015    T4TOTAL 6.6 04/17/2014     Lab Results   Component Value Date    WBC 7.4 03/10/2016    HGB 13.5 03/10/2016    HCT 41.6 03/10/2016    MCV 87.5 03/10/2016    PLT 149 03/10/2016     No components found for: CHLPL  Lab Results   Component Value Date    TRIG 137 05/04/2014    TRIG 153 (H) 08/21/2013    TRIG 102 07/24/2013     Lab Results   Component Value Date    HDL 49 05/04/2014    HDL 60 06/30/2013    HDL 62 (H) 03/16/2012     Lab Results   Component Value Date    LDLCALC 132 (H) 05/04/2014    LDLCALC 138 (H) 06/30/2013    LDLCALC 115 (H) 03/16/2012     Lab Results   Component Value Date    LABVLDL 27  05/04/2014    LABVLDL 19 06/30/2013    LABVLDL 18 03/16/2012     proBNP  196      02/17/2016      558      03/10/2016    Radiology Review:  Pertinent images / reports were reviewed as a part of this visit and reveals the following:    03/10/16: CXR:  FINDINGS:   Cervical spinal hardware is noted.   ??   The lungs are without acute focal process. ??There is no effusion or   pneumothorax. The cardiomediastinal silhouette is without acute process. The   osseous structures are without acute process.     02/13/16: CXR  FINDINGS:   Cardiomegaly. ??There appears be pulmonary vascular congestion and   interstitial edema. ??No focal airspace disease.   ??     ??   Impression   Findings suggest congestive heart failure   ??     Last Echo: Oct '16:  Summary  ??Technically limited study due to body habitus.  ??Normal left ventricle size and systolic function with an estimated??ejection fraction of 55%. No regional wall motion  abnormalities are seen.  ??There is mild concentric left ventricular hypertrophy.    Last Stress Test: Oct '16:  Summary   ??-Moderate sized anterior mostly reversible defect consistent with ischemia   ??in the territory of the mid LAD .   ??-Normal LV function.   ??-Study is limited by breast attenuation.     Cardiac Cath 09/20/15:  Anatomy:   LM-normal  LAD-normal  Cx-normal  OM1- normal  RCA-normal, codominant  RPDA- normal  LVEF- 60%  ??  Impression:  1. Normal coronaries  2. Normal LV systolic function  3. False positive stress test      Assessment:     1. Shortness of breath   ~neg PE by CTA ; H/H WNL   ~CXR without acute processes 03/10/16, proBNP elevated   ~diminshed BS and edema to exam     2. Other chest pain   ~resolved   ~previously complained of pain reproducible with palp  ~neg cor disease by cath x2        3. Essential hypertension   ~controlled               I had the opportunity to review the clinical symptoms and presentation of CIJI BOSTON.   Plan:     1. Stop HCTZ  2. Resume lasix at 40 mg daily  3. F/U in 3 weeks    Overall the patient is stable from CV standpoint    I have addresed the patient's cardiac risk factors and adjusted pharmacologic treatment as needed. In addition, I have reinforced the need for patient directed risk factor modification.    Further evaluation will be based upon the patient's clinical course and testing results.    All questions and concerns were addressed to the patient. Alternatives to my treatment were discussed.      The patient is not currently smoking. The risks related to smoking were reviewed with the patient. Recommend maintaining a smoke-free lifestyle.     Patient is not on a beta-blocker: neg MI  Patient is on an ace-i  Patient is not on a statin: neg CAD/hyperlipidemia    Dual Antiplatelet therapy / anti-coagulation has not been recommended / prescribed for this patient.     The patient verbalizes understanding not to stop medications without discussing with  [place].    Discussed exercise: 30-60 minutes 7  days/week  Discussed NAS diet.     Thank you for allowing to [place] to participate in the care of JODE LIPPE.    Luiz Iron, CNP    Documentation of today's visit sent to PCP

## 2016-03-13 NOTE — Patient Instructions (Signed)
Stop HCTZ and resume lasix at 40 mg once daily    appt in three weeks

## 2016-03-16 NOTE — Telephone Encounter (Signed)
Last OV 03/13/16  Labs 03/10/16

## 2016-03-17 NOTE — Telephone Encounter (Signed)
Spoke with pharmacy this was an error

## 2016-03-18 MED ORDER — MONTELUKAST SODIUM 10 MG PO TABS
10 MG | ORAL_TABLET | ORAL | 1 refills | Status: DC
Start: 2016-03-18 — End: 2017-01-03

## 2016-03-23 ENCOUNTER — Ambulatory Visit
Admit: 2016-03-23 | Discharge: 2016-03-23 | Payer: PRIVATE HEALTH INSURANCE | Attending: Family Medicine | Primary: Sports Medicine

## 2016-03-23 ENCOUNTER — Ambulatory Visit
Admit: 2016-03-23 | Discharge: 2016-03-23 | Payer: PRIVATE HEALTH INSURANCE | Attending: Dermatology | Primary: Sports Medicine

## 2016-03-23 DIAGNOSIS — L304 Erythema intertrigo: Secondary | ICD-10-CM

## 2016-03-23 DIAGNOSIS — F419 Anxiety disorder, unspecified: Secondary | ICD-10-CM

## 2016-03-23 MED ORDER — FLUOCINONIDE 0.05 % EX SOLN
0.05 % | CUTANEOUS | 1 refills | Status: DC
Start: 2016-03-23 — End: 2016-03-23

## 2016-03-23 MED ORDER — BLOOD PRESSURE KIT
PACK | Freq: Once | 0 refills | Status: DC
Start: 2016-03-23 — End: 2016-04-06

## 2016-03-23 MED ORDER — ALPRAZOLAM 0.25 MG PO TABS
0.25 | ORAL_TABLET | Freq: Two times a day (BID) | ORAL | 0 refills | Status: DC | PRN
Start: 2016-03-23 — End: 2016-06-22

## 2016-03-23 MED ORDER — BUSPIRONE HCL 10 MG PO TABS
10 MG | ORAL_TABLET | Freq: Three times a day (TID) | ORAL | 3 refills | Status: DC
Start: 2016-03-23 — End: 2016-04-09

## 2016-03-23 MED ORDER — TRIAMCINOLONE ACETONIDE 0.1 % EX CREA
0.1 % | CUTANEOUS | 1 refills | Status: DC
Start: 2016-03-23 — End: 2016-12-14

## 2016-03-23 NOTE — Progress Notes (Signed)
Subjective:      Patient ID: Rachael Mays is a 60 y.o. female.    HPI     Here for refill of xanax.  Last fill 05/2015.  She uses these approximately 2-3 times/week.  Reports that anxiety stems from daughters and their significant others.  She is helping raise a grandson (maternal rights surrendered from his mother to patient's daughter).  He is doing well.    She is following with cardiology for pulmonary effusions.    Review of Systems    Patient Active Problem List   Diagnosis   ??? Back pain   ??? Deep vein thrombosis ([place])   ??? Depression   ??? Hyperlipidemia   ??? Pulmonary embolism ([place])   ??? Nausea & vomiting   ??? Abdominal  pain, other specified site   ??? Morbid obesity ([place])   ??? Status post gastric banding   ??? Vertigo   ??? Dizziness   ??? Tinnitus, subjective   ??? Other complications of gastric band procedure   ??? Essential hypertension   ??? Hyperglycemia   ??? Chest pain   ??? 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 Adrenal mass, left ([place])   ??? Adrenal nodule ([place])   ??? Obstructive sleep apnea (adult) (pediatric)   ??? Dyspnea and respiratory abnormalities   ??? Abnormal stress test       Outpatient Prescriptions Marked as Taking for the 03/23/16 encounter (Office Visit) with Harvel Quale, MD   Medication Sig Dispense Refill   ??? triamcinolone (KENALOG) 0.1 % cream Apply to affected areas twice daily for up to 2 weeks or until improved. 80 g 1   ??? ALPRAZolam (XANAX) 0.25 MG tablet Take 1 tablet by mouth 2 times daily as needed for Anxiety 60 tablet 0   ??? busPIRone (BUSPAR) 10 MG tablet Take 1 tablet by mouth 3 times daily 90 tablet 3   ??? Blood Pressure KIT 1 each by Does not apply route once for 1 dose 1 kit 0   ??? montelukast (SINGULAIR) 10 MG tablet TAKE ONE TABLET BY MOUTH ONE TIME A DAY 90 tablet 1   ??? lisinopril (PRINIVIL;ZESTRIL) 40 MG tablet Take 40 mg by mouth daily     ??? furosemide (LASIX) 40 MG tablet Take 1 tablet by mouth daily 30 tablet 2   ??? traZODone (DESYREL) 100 MG tablet TAKE 1 TABLET BY MOUTH NIGHTLY 90 tablet  1   ??? albuterol sulfate HFA (PROVENTIL HFA) 108 (90 BASE) MCG/ACT inhaler Inhale 2 puffs into the lungs every 6 hours as needed for Wheezing 3 Inhaler 3   ??? Blood Pressure Monitor KIT Use daily as needed 1 kit 0   ??? hydrALAZINE (APRESOLINE) 25 MG tablet Take 1 tablet by mouth 3 times daily 270 tablet 3   ??? metoprolol (LOPRESSOR) 100 MG tablet Take 1.5 tablets by mouth 2 times daily 270 tablet 0   ??? ondansetron (ZOFRAN) 4 MG tablet Take 1 tablet by mouth daily as needed for Nausea or Vomiting 30 tablet 1   ??? venlafaxine (EFFEXOR XR) 150 MG XR capsule Take 1 capsule by mouth daily 90 capsule 3   ??? Multiple Vitamins-Minerals (MULTIVITAMIN PO) Take by mouth     ??? CALCIUM-VITAMIN D PO Take by mouth     ??? omeprazole (PRILOSEC) 40 MG capsule Take 1 capsule by mouth daily. 30 capsule 3   ??? cetirizine (ZYRTEC ALLERGY) 10 MG tablet Take 10 mg by mouth daily.  Allergies   Allergen Reactions   ??? Morphine Itching   ??? Talwin [Pentazocine] Other (See Comments)     dizzy       Social History   Substance Use Topics   ??? Smoking status: Former Smoker     Packs/day: 0.50     Years: 40.00     Types: Cigarettes     Quit date: 08/07/2012   ??? Smokeless tobacco: Never Used      Comment: started to smoke at age 46 / only smoked 0.5 p.p.d    ??? Alcohol use 0.0 oz/week     0 Standard drinks or equivalent per week      Comment: occasionally        Objective:   BP (!) 142/86 (Site: Left Arm, Position: Sitting, Cuff Size: Large Adult)   Pulse 68   Wt (!) 307 lb (139.3 kg)   SpO2 97%   BMI 49.55 kg/m2    Physical Exam   Constitutional: She is oriented to person, place, and time. She appears well-developed and well-nourished. No distress.   Cardiovascular: Regular rhythm.    Neurological: She is alert and oriented to person, place, and time.   Psychiatric: She has a normal mood and affect. Her behavior is normal.       Assessment:     1. Anxiety  ALPRAZolam (XANAX) 0.25 MG tablet    busPIRone (BUSPAR) 10 MG tablet   2. Depression, unspecified  depression type         Plan:   Discussed risks of xanax long-term.  Will refill but at 0.73m bid prn rather than the 0.545mbid prn.  Encouraged her to use buspar instead.       Controlled Substances Monitoring: Attestation: The Prescription Monitoring Report was requested today but not available. (THelene Kelp SlRayford HalstedMD)  Documentation: Possible medication side effects, risk of tolerance and/or dependence, and alternative treatments discussed, No signs of potential drug abuse or diversion identified. (TGrant Surgicenter LLClRayford HalstedMD)

## 2016-03-23 NOTE — Progress Notes (Signed)
Patient is here for a hypertension follow-up with no new concerns.  Patient is due for a cervical cancer screening.

## 2016-03-23 NOTE — Progress Notes (Signed)
[facility]  Antonietta Breach, MD  [place]  1956-06-14    60 y.o. female     Date of Visit: 03/23/2016    Chief Complaint: rash    History of Present Illness:    1.  2 year history of a worsening pruritic eruption in the groin, under the breasts.      2.  She also c/o a pruritic eruption on the scalp.    3.  Had lesion on the left lateral breast - recently traumatized.     4.  She also c/o lesions on the face.       Review of Systems:  Skin: No new or changing moles.    Past Medical History, Family History, Surgical History, Medications and Allergies reviewed.    Past Medical History:   Diagnosis Date   ??? Anxiety    ??? Asthma    ??? Chronic back pain    ??? Deep vein thrombosis    ??? Depression    ??? GERD (gastroesophageal reflux disease)     NO LONGER SINCE LAP   ??? GERD (gastroesophageal reflux disease) 01/23/2009   ??? Hyperlipidemia     hx; resolved with lap band   ??? Hypertension    ??? Obesity     hx of; had lap band   ??? Obstructive sleep apnea (adult) (pediatric) 09/18/2015   ??? Pulmonary embolism [facility])      Past Surgical History:   Procedure Laterality Date   ??? BACK SURGERY      neck  plates   ??? CESAREAN SECTION     ??? CHOLECYSTECTOMY     ??? COLONOSCOPY     ??? COLONOSCOPY  04/08/10   ??? HYSTERECTOMY     ??? LAP BAND  05/01/08    Dr. Nicole Cella   ??? OTHER SURGICAL HISTORY      [place] Filter: curently in place   ??? OTHER SURGICAL HISTORY  07/05/13    lap band removal   ??? PARTIAL HYSTERECTOMY     ??? SHOULDER SURGERY     ??? UPPER GASTROINTESTINAL ENDOSCOPY  05/19/13   ??? UPPER GASTROINTESTINAL ENDOSCOPY  07/21/13    ESOPHAGEAL STENT PLACEMENT   ??? UPPER GASTROINTESTINAL ENDOSCOPY N/A 07/31/2014    Esophagogastroduodenoscopy with esophageal balloon dilation   ??? VASCULAR SURGERY  04/2015    Lucendia Herrlich, celiac artery angiogram, normal abd arteries       Allergies   Allergen Reactions   ??? Morphine Itching   ??? Talwin [Pentazocine] Other (See Comments)     dizzy     Outpatient Prescriptions Marked as Taking  for the 03/23/16 encounter (Office Visit) with Francisca December, MD   Medication Sig Dispense Refill   ??? triamcinolone (KENALOG) 0.1 % cream Apply to affected areas twice daily for up to 2 weeks or until improved. 80 g 1   ??? fluocinonide (LIDEX) 0.05 % external solution Apply sparingly to the back of the scalp daily if needed for itching. 60 mL 1   ??? montelukast (SINGULAIR) 10 MG tablet TAKE ONE TABLET BY MOUTH ONE TIME A DAY 90 tablet 1   ??? lisinopril (PRINIVIL;ZESTRIL) 40 MG tablet Take 40 mg by mouth daily     ??? furosemide (LASIX) 40 MG tablet Take 1 tablet by mouth daily 30 tablet 2   ??? traZODone (DESYREL) 100 MG tablet TAKE 1 TABLET BY MOUTH NIGHTLY 90 tablet 1   ??? albuterol sulfate HFA (PROVENTIL HFA)  108 (90 BASE) MCG/ACT inhaler Inhale 2 puffs into the lungs every 6 hours as needed for Wheezing 3 Inhaler 3   ??? Blood Pressure Monitor KIT Use daily as needed 1 kit 0   ??? cyclobenzaprine (FLEXERIL) 10 MG tablet Take 1 tablet by mouth 3 times daily as needed for Muscle spasms 30 tablet 0   ??? HYDROcodone-acetaminophen (NORCO) 5-325 MG per tablet Take 1 tablet by mouth 4 times daily as needed (pain) . 15 tablet 0   ??? hydrALAZINE (APRESOLINE) 25 MG tablet Take 1 tablet by mouth 3 times daily 270 tablet 3   ??? metoprolol (LOPRESSOR) 100 MG tablet Take 1.5 tablets by mouth 2 times daily 270 tablet 0   ??? ALPRAZolam (XANAX) 0.5 MG tablet Take 1 tablet by mouth 2 times daily as needed for Anxiety 60 tablet 0   ??? ondansetron (ZOFRAN) 4 MG tablet Take 1 tablet by mouth daily as needed for Nausea or Vomiting 30 tablet 1   ??? venlafaxine (EFFEXOR XR) 150 MG XR capsule Take 1 capsule by mouth daily 90 capsule 3   ??? nystatin 100000 UNIT/GM POWD APPLY 3 TIMES DAILY 30 g 4   ??? Multiple Vitamins-Minerals (MULTIVITAMIN PO) Take by mouth     ??? CALCIUM-VITAMIN D PO Take by mouth     ??? omeprazole (PRILOSEC) 40 MG capsule Take 1 capsule by mouth daily. 30 capsule 3   ??? cetirizine (ZYRTEC ALLERGY) 10 MG tablet Take 10 mg by mouth daily.            Physical Examination       The following were examined and determined to be normal: Psych/Neuro, Head/face, Conjunctivae/eyelids, Gums/teeth/lips, Neck, Abdomen, Back, RUE, LUE, RLE and LLE.    The following were examined and determined to be abnormal: Scalp/hair, Breast/axilla/chest and Genitalia/groin/buttocks.     Well appearing.    1.  Inframammary regions, inguinal folds with few macerated mildly erythematous patches.    2.  Lower occipital scalp - ill-defined scaly erythematous patch.    3.  Left lateral breast with a round hypopigmented scar.    4.  Right malar cheek with a stuck on appearing verrucous brown papule.        Assessment and Plan     1. Intertrigo  -    Education.  Drying measures discussed.      2. Seborrheic dermatitis of the scalp - mild today    Lidex solution daily until improved and then as needed for recurrence.      3. Scar     Reassurance.      4. SK (seborrheic keratosis)     Reassurance.

## 2016-03-25 ENCOUNTER — Inpatient Hospital Stay
Admit: 2016-03-25 | Discharge: 2016-03-26 | Disposition: A | Discharge status: Home / Self Care | Payer: PRIVATE HEALTH INSURANCE | Attending: Emergency Medicine

## 2016-03-25 ENCOUNTER — Emergency Department: Admit: 2016-03-25 | Primary: Sports Medicine

## 2016-03-25 DIAGNOSIS — R0789 Other chest pain: Secondary | ICD-10-CM

## 2016-03-25 LAB — CBC WITH AUTO DIFFERENTIAL
Basophils %: 1.8 %
Basophils Absolute: 0.1 10*3/uL (ref 0.0–0.2)
Eosinophils %: 4.6 %
Eosinophils Absolute: 0.4 10*3/uL (ref 0.0–0.6)
Hematocrit: 41.8 % (ref 36.0–48.0)
Hemoglobin: 13.6 g/dL (ref 12.0–16.0)
Lymphocytes %: 35.6 %
Lymphocytes Absolute: 2.9 10*3/uL (ref 1.0–5.1)
MCH: 28.3 pg (ref 26.0–34.0)
MCHC: 32.5 g/dL (ref 31.0–36.0)
MCV: 87 fL (ref 80.0–100.0)
MPV: 9.9 fL (ref 5.0–10.5)
Monocytes %: 7.6 %
Monocytes Absolute: 0.6 10*3/uL (ref 0.0–1.3)
Neutrophils %: 50.4 %
Neutrophils Absolute: 4.1 10*3/uL (ref 1.7–7.7)
Platelets: 157 10*3/uL (ref 135–450)
RBC: 4.8 M/uL (ref 4.00–5.20)
RDW: 14.2 % (ref 12.4–15.4)
WBC: 8.2 10*3/uL (ref 4.0–11.0)

## 2016-03-25 LAB — COMPREHENSIVE METABOLIC PANEL
ALT: 53 U/L — ABNORMAL HIGH (ref 10–40)
AST: 50 U/L — ABNORMAL HIGH (ref 15–37)
Albumin/Globulin Ratio: 1.1 (ref 1.1–2.2)
Albumin: 3.9 g/dL (ref 3.4–5.0)
Alkaline Phosphatase: 80 U/L (ref 40–129)
Anion Gap: 12 (ref 3–16)
BUN: 13 mg/dL (ref 7–20)
CO2: 28 mmol/L (ref 21–32)
Calcium: 9.1 mg/dL (ref 8.3–10.6)
Chloride: 101 mmol/L (ref 99–110)
Creatinine: 0.8 mg/dL (ref 0.6–1.1)
GFR African American: >60 (ref >60)
GFR Non-African American: >60 (ref >60)
Globulin: 3.7 g/dL
Glucose: 90 mg/dL (ref 70–99)
Potassium: 3.3 mmol/L — ABNORMAL LOW (ref 3.5–5.1)
Sodium: 141 mmol/L (ref 136–145)
Total Bilirubin: 0.4 mg/dL (ref 0.0–1.0)
Total Protein: 7.6 g/dL (ref 6.4–8.2)

## 2016-03-25 LAB — PROTIME-INR
INR: 1.03 (ref 0.85–1.15)
Protime: 11.6 s (ref 9.6–13.0)

## 2016-03-25 MED ORDER — HYDROMORPHONE HCL 1 MG/ML IJ SOLN
1 MG/ML | INTRAMUSCULAR | Status: DC | PRN
Start: 2016-03-25 — End: 2016-03-26
  Administered 2016-03-26: 1 mg via INTRAVENOUS

## 2016-03-25 MED ORDER — IOPAMIDOL 76 % IV SOLN
76 % | Freq: Once | INTRAVENOUS | Status: AC | PRN
Start: 2016-03-25 — End: 2016-03-25
  Administered 2016-03-25: 100 mL via INTRAVENOUS

## 2016-03-25 MED ORDER — FUROSEMIDE 10 MG/ML IJ SOLN
10 MG/ML | Freq: Once | INTRAMUSCULAR | Status: AC
Start: 2016-03-25 — End: 2016-03-25
  Administered 2016-03-26: 40 mg via INTRAVENOUS

## 2016-03-25 MED ORDER — NITROGLYCERIN 2 % TD OINT
2 % | Freq: Once | TRANSDERMAL | Status: AC
Start: 2016-03-25 — End: 2016-03-25
  Administered 2016-03-26: 1 [in_us] via TOPICAL

## 2016-03-25 MED ORDER — ASPIRIN 81 MG PO CHEW
81 MG | Freq: Once | ORAL | Status: AC
Start: 2016-03-25 — End: 2016-03-25
  Administered 2016-03-26: 324 mg via ORAL

## 2016-03-25 MED FILL — ISOVUE-370 76 % IV SOLN: 76 % | INTRAVENOUS | Qty: 100

## 2016-03-25 MED FILL — HYDROMORPHONE HCL 1 MG/ML IJ SOLN: 1 MG/ML | INTRAMUSCULAR | Qty: 1

## 2016-03-25 MED FILL — ASPIRIN 81 MG PO CHEW: 81 MG | ORAL | Qty: 4

## 2016-03-25 MED FILL — NITRO-BID 2 % TD OINT: 2 % | TRANSDERMAL | Qty: 1

## 2016-03-25 MED FILL — FUROSEMIDE 10 MG/ML IJ SOLN: 10 MG/ML | INTRAMUSCULAR | Qty: 4

## 2016-03-25 NOTE — Telephone Encounter (Signed)
Patietn has had a bad problem with SOB today. Patient has swelling in her legs and hands. She states she has felt bloated all day. When she takes deep breaths she gets a sharp pain in her left breast area that does go into her shoulder and halfway down her left arm. I talked to [facility] and I let her know that he advises that she go to the ED. She said okay thank you for the help.

## 2016-03-25 NOTE — ED Notes (Signed)
Pt ambulated independently without assist. O2 level maintained 95-98%.     Wynelle [place]lane, RN  03/25/16 2113

## 2016-03-25 NOTE — ED Provider Notes (Addendum)
[facility] Emergency Department    CHIEF COMPLAINT  Chief Complaint   Patient presents with   ??? Shortness of Breath     states started to have increased SOB and swelling from this am. Dr. Bunnie Domino sent to ER.         HISTORY OF PRESENT ILLNESS  Rachael Mays is a 60 y.o. female  who presents to the ED complaining of several days of worsening shortness of breath and chest discomfort.  She describes the chest discomfort is left-sided and quite severe.  She was sent here while at an outpatient pain specialist appointment having told her pain specialist about the discomfort she is having.  He recommended she call his cardiologist which she did and was referred to the ED by phone from cardiology.  She has been having a mild cough.  No fevers.  No new leg swelling although she does have some chronic leg swelling.  She has a history of blood clots but is not currently anticoagulated.  No abdominal pain, n/v/d.    No other complaints, modifying factors or associated symptoms.     I have reviewed the following from the nursing documentation.    Past Medical History:   Diagnosis Date   ??? Anxiety    ??? Asthma    ??? Chronic back pain    ??? Deep vein thrombosis    ??? Depression    ??? GERD (gastroesophageal reflux disease)     NO LONGER SINCE LAP   ??? GERD (gastroesophageal reflux disease) 01/23/2009   ??? Hyperlipidemia     hx; resolved with lap band   ??? Hypertension    ??? Obesity     hx of; had lap band   ??? Obstructive sleep apnea (adult) (pediatric) 09/18/2015   ??? Pulmonary embolism [facility])      Past Surgical History:   Procedure Laterality Date   ??? BACK SURGERY      neck  plates   ??? CESAREAN SECTION     ??? CHOLECYSTECTOMY     ??? COLONOSCOPY     ??? COLONOSCOPY  04/08/10   ??? HYSTERECTOMY     ??? LAP BAND  05/01/08    Dr. Nicole Cella   ??? OTHER SURGICAL HISTORY      [place] Filter: curently in place   ??? OTHER SURGICAL HISTORY  07/05/13    lap band removal   ??? PARTIAL HYSTERECTOMY     ??? SHOULDER SURGERY     ??? UPPER  GASTROINTESTINAL ENDOSCOPY  05/19/13   ??? UPPER GASTROINTESTINAL ENDOSCOPY  07/21/13    ESOPHAGEAL STENT PLACEMENT   ??? UPPER GASTROINTESTINAL ENDOSCOPY N/A 07/31/2014    Esophagogastroduodenoscopy with esophageal balloon dilation   ??? VASCULAR SURGERY  04/2015    Lucendia Herrlich, celiac artery angiogram, normal abd arteries     Family History   Problem Relation Age of Onset   ??? Arthritis Mother    ??? Cancer Mother    ??? Depression Mother    ??? High Blood Pressure Mother    ??? Heart Disease Mother 32     MI    ??? Ovarian Cancer Mother 81   ??? Cancer Father 38     colon   ??? Heart Disease Maternal Grandmother    ??? Early Death Paternal Grandmother    ??? Heart Disease Paternal Grandfather    ??? Diabetes Other    ??? High Blood Pressure Other    ??? Obesity Other    ??? Other Sister  OSA   ??? Other Brother      OSA   ??? Other Brother      OSA   ??? Other Daughter      OSA     Social History     Social History   ??? Marital status: Married     Spouse name: N/A   ??? Number of children: N/A   ??? Years of education: N/A     Occupational History   ??? Not on file.     Social History Main Topics   ??? Smoking status: Former Smoker     Packs/day: 0.50     Years: 40.00     Types: Cigarettes     Quit date: 08/07/2012   ??? Smokeless tobacco: Never Used      Comment: started to smoke at age 32 / only smoked 0.5 p.p.d    ??? Alcohol use 0.0 oz/week     0 Standard drinks or equivalent per week      Comment: occasionally    ??? Drug use: No   ??? Sexual activity: No     Other Topics Concern   ??? Not on file     Social History Narrative    ** Merged History Encounter **          Current Facility-Administered Medications   Medication Dose Route Frequency Provider Last Rate Last Dose   ??? HYDROmorphone (DILAUDID) injection 1 mg  1 mg Intravenous Q1H PRN Waldo Laine, MD   1 mg at 03/25/16 2002   ??? HYDROcodone-acetaminophen (NORCO) 5-325 MG per tablet 2 tablet  2 tablet Oral Once Waldo Laine, MD         Current Outpatient Prescriptions   Medication Sig Dispense Refill   ???  triamcinolone (KENALOG) 0.1 % cream Apply to affected areas twice daily for up to 2 weeks or until improved. 80 g 1   ??? ALPRAZolam (XANAX) 0.25 MG tablet Take 1 tablet by mouth 2 times daily as needed for Anxiety 60 tablet 0   ??? busPIRone (BUSPAR) 10 MG tablet Take 1 tablet by mouth 3 times daily 90 tablet 3   ??? Blood Pressure KIT 1 each by Does not apply route once for 1 dose 1 kit 0   ??? montelukast (SINGULAIR) 10 MG tablet TAKE ONE TABLET BY MOUTH ONE TIME A DAY 90 tablet 1   ??? lisinopril (PRINIVIL;ZESTRIL) 40 MG tablet Take 40 mg by mouth daily     ??? furosemide (LASIX) 40 MG tablet Take 1 tablet by mouth daily 30 tablet 2   ??? traZODone (DESYREL) 100 MG tablet TAKE 1 TABLET BY MOUTH NIGHTLY 90 tablet 1   ??? albuterol sulfate HFA (PROVENTIL HFA) 108 (90 BASE) MCG/ACT inhaler Inhale 2 puffs into the lungs every 6 hours as needed for Wheezing 3 Inhaler 3   ??? Blood Pressure Monitor KIT Use daily as needed 1 kit 0   ??? hydrALAZINE (APRESOLINE) 25 MG tablet Take 1 tablet by mouth 3 times daily 270 tablet 3   ??? metoprolol (LOPRESSOR) 100 MG tablet Take 1.5 tablets by mouth 2 times daily 270 tablet 0   ??? ondansetron (ZOFRAN) 4 MG tablet Take 1 tablet by mouth daily as needed for Nausea or Vomiting 30 tablet 1   ??? venlafaxine (EFFEXOR XR) 150 MG XR capsule Take 1 capsule by mouth daily 90 capsule 3   ??? Multiple Vitamins-Minerals (MULTIVITAMIN PO) Take by mouth     ??? CALCIUM-VITAMIN D PO  Take by mouth     ??? omeprazole (PRILOSEC) 40 MG capsule Take 1 capsule by mouth daily. 30 capsule 3   ??? cetirizine (ZYRTEC ALLERGY) 10 MG tablet Take 10 mg by mouth daily.       Allergies   Allergen Reactions   ??? Morphine Itching   ??? Talwin [Pentazocine] Other (See Comments)     dizzy       REVIEW OF SYSTEMS  10 systems reviewed, pertinent positives per HPI otherwise noted to be negative.    PHYSICAL EXAM  BP (!) 153/74   Pulse 64   Temp 98.3 ??F (36.8 ??C)   Resp 18   Ht '5\' 6"'  (1.676 m)   Wt (!) 308 lb 8 oz (139.9 kg)   SpO2 92%   BMI 49.79  kg/m2   GENERAL APPEARANCE: Awake and alert. Cooperative. No distress.  HENT: Normocephalic. Atraumatic. Mucous membranes are moist.  NECK: Supple.  EYES: PERRL. EOM's grossly intact.  HEART/CHEST: RRR. No murmurs.  No chest wall tenderness.  LUNGS: Respirations unlabored. CTAB. Good air exchange. Speaking comfortably in full sentences.   ABDOMEN: No tenderness. Soft. Non-distended. No masses. No organomegaly. No guarding or rebound. Normal bowel sounds throughout.  MUSCULOSKELETAL: No extremity edema. Compartments soft.  No deformity.  No tenderness in the extremities.  All extremities neurovascularly intact.  SKIN: Warm and dry. No acute rashes.   NEUROLOGICAL: Alert and oriented. CN's 2-12 intact. No gross facial drooping. Strength 5/5, sensation intact. 2 plus DTR's in knees bilaterally.  Gait normal.  PSYCHIATRIC: Normal mood and affect.    LABS  I have reviewed all labs for this visit.   Results for orders placed or performed during the hospital encounter of 03/25/16   CBC Auto Differential   Result Value Ref Range    WBC 8.2 4.0 - 11.0 K/uL    RBC 4.80 4.00 - 5.20 M/uL    Hemoglobin 13.6 12.0 - 16.0 g/dL    Hematocrit 41.8 36.0 - 48.0 %    MCV 87.0 80.0 - 100.0 fL    MCH 28.3 26.0 - 34.0 pg    MCHC 32.5 31.0 - 36.0 g/dL    RDW 14.2 12.4 - 15.4 %    Platelets 157 135 - 450 K/uL    MPV 9.9 5.0 - 10.5 fL    Neutrophils % 50.4 %    Lymphocytes % 35.6 %    Monocytes % 7.6 %    Eosinophils % 4.6 %    Basophils % 1.8 %    Neutrophils # 4.1 1.7 - 7.7 K/uL    Lymphocytes # 2.9 1.0 - 5.1 K/uL    Monocytes # 0.6 0.0 - 1.3 K/uL    Eosinophils # 0.4 0.0 - 0.6 K/uL    Basophils # 0.1 0.0 - 0.2 K/uL   Comprehensive Metabolic Panel   Result Value Ref Range    Sodium 141 136 - 145 mmol/L    Potassium 3.3 (L) 3.5 - 5.1 mmol/L    Chloride 101 99 - 110 mmol/L    CO2 28 21 - 32 mmol/L    Anion Gap 12 3 - 16    Glucose 90 70 - 99 mg/dL    BUN 13 7 - 20 mg/dL    CREATININE 0.8 0.6 - 1.1 mg/dL    GFR Non-African American >60 >60     GFR African American >60 >60    Calcium 9.1 8.3 - 10.6 mg/dL    Total Protein 7.6 6.4 -  8.2 g/dL    Alb 3.9 3.4 - 5.0 g/dL    Albumin/Globulin Ratio 1.1 1.1 - 2.2    Total Bilirubin 0.4 0.0 - 1.0 mg/dL    Alkaline Phosphatase 80 40 - 129 U/L    ALT 53 (H) 10 - 40 U/L    AST 50 (H) 15 - 37 U/L    Globulin 3.7 g/dL   Troponin   Result Value Ref Range    Troponin <0.01 <0.01 ng/mL   Brain Natriuretic Peptide   Result Value Ref Range    Pro-BNP 521 (H) 0 - 124 pg/mL   Protime-INR   Result Value Ref Range    Protime 11.6 9.6 - 13.0 sec    INR 1.03 0.85 - 1.15     The 12 lead EKG was interpreted by me as follows:  Rate: bradycardia with a rate of 53  Rhythm: sinus  Axis: normal  Intervals: normal PR, narrow QRS, normal QTc  ST segments: no ST elevations or depressions  T waves: no abnormal inversions  Non-specific T wave changes: not present  Prior EKG comparison: EKG dated 03/10/16 is not significantly different      RADIOLOGY    Xr Chest Standard Two Vw    Result Date: 03/25/2016  EXAMINATION: TWO VIEWS OF THE CHEST 03/25/2016 6:31 pm COMPARISON: CT chest and chest radiograph March 10, 2016 HISTORY: ORDERING PHYSICIAN PROVIDED HISTORY: sob TECHNOLOGIST PROVIDED HISTORY: Technologist Provided Reason for Exam: states started to have increased SOB and swelling from this am. Dr. Bunnie Domino sent to ER.  PT HAS HISTORY OF ASTHMA, HYPERTENSION, PULMONARY EMBOLISM.  FORMER SMOKER QUIT IN 2013 Acuity: Unknown Type of Encounter: Unknown FINDINGS: Previously seen mild edema has resolved.  There is no focal acute process in the lungs.  No pneumothorax or pleural effusion identified.  Cardiac silhouette is at the upper limits of normal.  Postsurgical changes again seen in the lower cervical spine.  No acute osseous abnormality.     No acute process.     Xr Chest Standard Two Vw    Result Date: 03/10/2016  EXAMINATION: TWO VIEWS OF THE CHEST 03/10/2016 3:22 pm COMPARISON: 02/13/2016 HISTORY: ORDERING SYSTEM PROVIDED HISTORY: chest pain , sob  TECHNOLOGIST PROVIDED HISTORY: Ordering Physician Provided Reason for Exam: pt c/o chest pain, sob, headache x 2 days Acuity: Acute Type of Encounter: Initial FINDINGS: Cervical spinal hardware is noted. The lungs are without acute focal process.  There is no effusion or pneumothorax. The cardiomediastinal silhouette is without acute process. The osseous structures are without acute process.     No acute process.     Cta Pulmonary With Contrast    Result Date: 03/25/2016  EXAMINATION: CTA OF THE CHEST 03/25/2016 7:58 pm TECHNIQUE: CTA of the chest was performed after the administration of intravenous contrast.  Multiplanar reformatted images are provided for review.  MIP images are provided for review. Dose modulation, iterative reconstruction, and/or weight based adjustment of the mA/kV was utilized to reduce the radiation dose to as low as reasonably achievable. COMPARISON: Chest radiograph same date and CT of the chest March 10, 2016. HISTORY: ORDERING PHYSICIAN PROVIDED HISTORY: sob cp TECHNOLOGIST PROVIDED HISTORY: Technologist Provided Reason for Exam: sob cp Acuity: Acute Type of Encounter: Initial Additional signs and symptoms: states started to have increased SOB and swelling from this am. Dr. Bunnie Domino sent to ER. FINDINGS: Pulmonary Arteries: Pulmonary arteries are adequately opacified for evaluation.  No evidence of intraluminal filling defect to suggest pulmonary embolism.  Main pulmonary  artery is normal in caliber. Mediastinum: No evidence of mediastinal lymphadenopathy.  The heart and pericardium demonstrate no acute abnormality.  There is no acute abnormality of the thoracic aorta. Lungs/pleura: The central airways are patent.  Calcified granulomas again seen in the right lower lobe.  The previously seen small bilateral pleural effusions and mild septal thickening and ground-glass opacity have resolved. No pneumothorax or pleural effusion identified. Upper Abdomen: Limited images of the upper abdomen is  without acute process. Soft Tissues/Bones: No acute bone or soft tissue abnormality.     No evidence of pulmonary embolism or acute pulmonary abnormality.     Cta Pulmonary With Contrast    Result Date: 03/10/2016  EXAMINATION: CTA OF THE CHEST 03/10/2016 5:24 pm TECHNIQUE: CTA of the chest was performed after the administration of intravenous contrast.  Multiplanar reformatted images are provided for review.  MIP images are provided for review. Dose modulation, iterative reconstruction, and/or weight based adjustment of the mA/kV was utilized to reduce the radiation dose to as low as reasonably achievable. COMPARISON: December 15, 2015 HISTORY: ORDERING SYSTEM PROVIDED HISTORY: Chest pain and shortness of breath TECHNOLOGIST PROVIDED HISTORY: If patient is on cardiac monitor and/or pulse ox, they may be taken off cardiac monitor and pulse ox, left on O2 if currently on. All monitors reattached when patient returns to room. Ordering Physician Provided Reason for Exam: Chest pain and shortness of breath Acuity: Unknown Type of Encounter: Unknown FINDINGS: Pulmonary Arteries: Pulmonary arteries are adequately opacified for evaluation.  No evidence of intraluminal filling defect to suggest pulmonary embolism.  Main pulmonary artery is normal in caliber. Mediastinum: No thoracic aortic aneurysm.  Visualized thyroid gland is unremarkable.  No pericardial effusion.  Cardiac chambers are mildly enlarged, unchanged.  Interval increase in mediastinal lymph nodes.  These measure up to 1.5 cm in short axis. Lungs/pleura: No pneumothorax.  Small right and trace left pleural effusions, new.  Calcified granuloma in the right lower lobe.  Interlobular septal wall thickening throughout the lungs, new. Upper Abdomen: 1.4 x 1.7 cm right adrenal gland nodule, unchanged. Soft Tissues/Bones: No acute bone or soft tissue abnormality.     No evidence of pulmonary embolus. Small right and trace left pleural effusions, new as well as interlobular  septal wall thickening.  Findings likely related to edema. Mild mediastinal adenopathy, new.  Findings may be reactive.  Recommend follow-up to ensure resolution.       ED COURSE/MDM  Patient seen and evaluated. Old records reviewed. Labs and imaging reviewed and results discussed with patient.      After initial evaluation, differential diagnostic considerations included: acute coronary syndrome, pulmonary embolism, COPD/asthma, pneumonia, musculoskeletal, reflux/PUD/gastritis, pneumothorax, CHF, thoracic aortic dissection, anxiety    The patient's ED workup was notable for chest discomfort which she says is relieved by nitro ointment.  She'll also given Lasix and a dose of aspirin here. Had a negative troponin x2 and EKG without any acutely ischemic changes.  CT pulmonary angiogram was obtained based on her history and negative for PE or other acute pathology such as infection.  She was ambulatory without hypoxia.  After initial consideration of admission and my discussion with Dr. Rosette Reveal, I reviewed her cath from 6 months ago with clean coronaries, making ACS highly unlikely.  I suspect reflux or anxiety etiology for her pain.  F/u with her pain specialist.    During the patient's ED course, the patient was given:  Medications   HYDROmorphone (DILAUDID) injection 1 mg (1 mg Intravenous  Given 03/25/16 2002)   HYDROcodone-acetaminophen (NORCO) 5-325 MG per tablet 2 tablet (not administered)   aspirin chewable tablet 324 mg (324 mg Oral Given 03/25/16 2003)   furosemide (LASIX) injection 40 mg (40 mg Intravenous Given 03/25/16 2002)   nitroglycerin (NITRO-BID) 2 % ointment 1 inch (1 inch Topical Given 03/25/16 2002)   iopamidol (ISOVUE-370) 76 % injection 100 mL (100 mLs Intravenous Given 03/25/16 1953)        CLINICAL IMPRESSION  1. Chest discomfort    2. SOB (shortness of breath)        Blood pressure (!) 153/74, pulse 64, temperature 98.3 ??F (36.8 ??C), resp. rate 18, height '5\' 6"'  (1.676 m), weight (!) 308 lb 8 oz  (139.9 kg), SpO2 92 %, not currently breastfeeding.    DISPOSITION  Rachael Mays was discharged to home in stable condition.    I have discussed the findings of today's workup with the patient and addressed the patient's questions and concerns.  Important warning signs as well as new or worsening symptoms which would necessitate immediate return to the ED were discussed.  The plan is to discharge from the ED at this time, and the patient is in stable condition.  The patient acknowledged understanding is agreeable with this plan.    DISCLAIMER: This chart was created using [employer].  Efforts were made by me to ensure accuracy, however some errors may be present due to limitations of this technology and occasionally words are not transcribed correctly.        Waldo Laine, MD  03/25/16 2147       Waldo Laine, MD  03/25/16 2230

## 2016-03-26 LAB — EKG 12-LEAD
Atrial Rate: 53 {beats}/min
P Axis: 26 degrees
P-R Interval: 150 ms
Q-T Interval: 442 ms
QRS Duration: 78 ms
QTc Calculation (Bazett): 414 ms
R Axis: 42 degrees
T Axis: 27 degrees
Ventricular Rate: 53 {beats}/min

## 2016-03-26 LAB — BRAIN NATRIURETIC PEPTIDE: Pro-BNP: 521 pg/mL — ABNORMAL HIGH (ref 0–124)

## 2016-03-26 LAB — TROPONIN
Troponin: <0.01 ng/mL (ref <0.01)
Troponin: <0.01 ng/mL (ref <0.01)

## 2016-03-26 MED ORDER — HYDROCODONE-ACETAMINOPHEN 5-325 MG PO TABS
5-325 MG | Freq: Once | ORAL | Status: AC
Start: 2016-03-26 — End: 2016-03-25
  Administered 2016-03-26: 03:00:00 2 via ORAL

## 2016-03-26 MED FILL — HYDROCODONE-ACETAMINOPHEN 5-325 MG PO TABS: 5-325 MG | ORAL | Qty: 2

## 2016-03-26 NOTE — Telephone Encounter (Signed)
Went to [facility] ER yesterday . Would like [facility] to look at all testing done yesterday and call her

## 2016-03-26 NOTE — Care Coordination-Inpatient (Signed)
LMVM attempting to reach pt for ED f/u phone call.     CHIEF COMPLAINT       Chief Complaint   Patient presents with   ??? Shortness of Breath   ?? ?? states started to have increased SOB and swelling from this am. Dr. Bunnie Domino sent to ER.      Encouraged to call and make f/u OV.     Floyce Stakes, RN,BSN,CCM  Nurse Care Coordinator  [facility]  [phone]

## 2016-03-27 MED ORDER — FUROSEMIDE 40 MG PO TABS
40 | ORAL_TABLET | Freq: Two times a day (BID) | ORAL | 5 refills | Status: DC
Start: 2016-03-27 — End: 2016-04-02

## 2016-03-27 MED ORDER — POTASSIUM CHLORIDE CRYS ER 20 MEQ PO TBCR
20 MEQ | ORAL_TABLET | Freq: Every day | ORAL | 5 refills | Status: DC
Start: 2016-03-27 — End: 2016-04-09

## 2016-03-27 NOTE — Telephone Encounter (Signed)
Called and spoke with patient.  Increase lasix to 40 mg po Bid and add K+ 20 Meq po daily. Have her see me Monday morning.  Patient instructed that if shortness of breath gets worse, she should go to the ER and should then be admitted.

## 2016-03-27 NOTE — Telephone Encounter (Signed)
Dr., C., this patient was seen in the ER 03/10/16 and 03/25/16 for CP and SOB. She said this last ER visit, they discussed possibly admitting her for "more testing," but she ended up going home. She said they told her to f/u with PCP and cardiologist.      She has seen Dr. Thornell Mule for "breathing" issues and is due to see [facility] pulmonology 05/05/16. She says inhalers and nebulizers are not helping with her SOB, and she is very concerned. However, this seems like it's been progressing for several months now.    She had an abnormal stress last 09/2015 followed by a LHC that you performed which showed normal cors/EF. ECHO also normal with LVH at that time.     She has seen Tami twice in the last 6 weeks and due to see her next Thursday, all for same symptoms. I can't find where you've seen her in office, but I might have missed it.  I'm not sure what to tell her -- I think she wants to be admitted.........      Here is the end of the most recent ER note:  After initial evaluation, differential diagnostic considerations included: acute coronary syndrome, pulmonary embolism, COPD/asthma, pneumonia, musculoskeletal, reflux/PUD/gastritis, pneumothorax, CHF, thoracic aortic dissection, anxiety  ??  The patient's ED workup was notable for chest discomfort which she says is relieved by nitro ointment. She'll also given Lasix and a dose of aspirin here. Had a negative troponin x2 and EKG without any acutely ischemic changes. CT pulmonary angiogram was obtained based on her history and negative for PE or other acute pathology such as infection. She was ambulatory without hypoxia. After initial consideration of admission and my discussion with Dr. Rosette Reveal, I reviewed her cath from 6 months ago with clean coronaries, making ACS highly unlikely. I suspect reflux or anxiety etiology for her pain. F/u with her pain specialist.    Any advice on what I should tell her???

## 2016-03-27 NOTE — Telephone Encounter (Signed)
Dr. Susy Manor spoke to the patient as I was sending this message. His orders to her were> Double Lasix 40mg  from once a day to bid.     Due to recent K+ of 3.3, she is also to take KCL 18meq daily. He wants to see her Monday 03/30/16 early morning before he starts his procedures.    I called Riella and re-affirmed the twice a day Lasix. She agreed to also take KCL and to come in on Monday at 7:30am.    Klor-con 37meq E-rx'd to [facility]. She does not need any Lasix at this time.

## 2016-03-27 NOTE — Telephone Encounter (Signed)
Pt calling back has not heard from [facility] . She is having trouble catching her breath and needs to know what to do? Pls call to advise Thank you

## 2016-03-30 ENCOUNTER — Ambulatory Visit
Admit: 2016-03-30 | Discharge: 2016-03-30 | Payer: PRIVATE HEALTH INSURANCE | Attending: Cardiovascular Disease | Primary: Sports Medicine

## 2016-03-30 DIAGNOSIS — R0602 Shortness of breath: Secondary | ICD-10-CM

## 2016-03-30 NOTE — Patient Instructions (Signed)
Right and left cardiac catherization to evaluate coronary and pulmonary artery.  Will refer to Dr. Della Goo pulmonology for another opinion of shortness of breath.  Follow up afterwards.

## 2016-03-30 NOTE — Progress Notes (Signed)
Rachael Mays   Cardiac Follow up    Referring Provider:  Harvel Quale, MD     Chief Complaint   Patient presents with   ??? Hypertension   ??? Shortness of Breath     all the time   ??? Chest Pain   ??? Nausea   ??? Dizziness     limb heaviness        History of Present Illness:   Rachael Mays presents for evaluation for chest pain and shortness of breath. She had normal coronaries and grams in October 2016 in June 2013.  She was doing okay until around Christmas and she had swelling at that time that progressed to generalized edema by Valentine's day. She had worsening associated shortness of breath. Her chest pain is constant  aching and sharp at times. She has generalized weakness. In comparison to chest pain she had when she got the angiogram it's sharper. Lab result reviewed and Pro bnp 521. Sh has morbid obesity. She was seen by Dr. Jeb Levering who did a follow up CTA of her chest and CTPA  for continued shortness of breath 03/25/16 that showed no evidence of pulmonary embolism or acute pulmonary abnormality. A chest xray on the same day suggested congestive heart failure.  Her diuretics were increased and her swelling improved but she essentially had no improvement in her shortness of breath and chest pain.        Past Medical History:   has a past medical history of Anxiety; Asthma; Chronic back pain; Deep vein thrombosis; Depression; GERD (gastroesophageal reflux disease); GERD (gastroesophageal reflux disease); Hyperlipidemia; Hypertension; Obesity; Obstructive sleep apnea (adult) (pediatric); and Pulmonary embolism ([place]).    Surgical History:   has a past surgical history that includes Colonoscopy; Cesarean section; Cholecystectomy; Hysterectomy; shoulder surgery; Lap Band (05/01/08); back surgery; Colonoscopy (04/08/10); other surgical history; Upper gastrointestinal endoscopy (05/19/13); other surgical history (07/05/13); Upper gastrointestinal endoscopy (07/21/13); partial hysterectomy (cervix not  removed); Upper gastrointestinal endoscopy (N/A, 07/31/2014); and vascular surgery (04/2015).     Social History:   reports that she quit smoking about 3 years ago. Her smoking use included Cigarettes. She has a 20.00 pack-year smoking history. She has never used smokeless tobacco. She reports that she drinks alcohol. She reports that she does not use illicit drugs.     Family History:  family history includes Arthritis in her mother; Cancer in her mother; Cancer (age of onset: 19) in her father; Depression in her mother; Diabetes in an other family member; Early Death in her paternal grandmother; Heart Disease in her maternal grandmother and paternal grandfather; Heart Disease (age of onset: 23) in her mother; High Blood Pressure in her mother and another family member; Obesity in an other family member; Other in her brother, brother, daughter, and sister; Ovarian Cancer (age of onset: 19) in her mother.     Home Medications:  Prior to Admission medications    Medication Sig Start Date End Date Taking? Authorizing Provider   traMADol (ULTRAM) 50 MG tablet Take 100 mg by mouth every 8 hours as needed for Pain   Yes Historical Provider, MD   furosemide (LASIX) 40 MG tablet Take 1 tablet by mouth 2 times daily 03/27/16  Yes Leodis Binet, MD   potassium chloride (KLOR-CON M) 20 MEQ extended release tablet Take 1 tablet by mouth daily 03/27/16  Yes Leodis Binet, MD   triamcinolone (KENALOG) 0.1 % cream Apply to affected areas twice daily for up to  2 weeks or until improved. 03/23/16  Yes Francisca December, MD   ALPRAZolam Duanne Moron) 0.25 MG tablet Take 1 tablet by mouth 2 times daily as needed for Anxiety 03/23/16  Yes Harvel Quale, MD   busPIRone (BUSPAR) 10 MG tablet Take 1 tablet by mouth 3 times daily 03/23/16  Yes Harvel Quale, MD   montelukast (SINGULAIR) 10 MG tablet TAKE ONE TABLET BY MOUTH ONE TIME A DAY 03/18/16  Yes Jamal Collin, MD   lisinopril (PRINIVIL;ZESTRIL) 40 MG tablet Take 40 mg  by mouth daily   Yes Historical Provider, MD   traZODone (DESYREL) 100 MG tablet TAKE 1 TABLET BY MOUTH NIGHTLY 01/02/16  Yes Harvel Quale, MD   albuterol sulfate HFA (PROVENTIL HFA) 108 (90 BASE) MCG/ACT inhaler Inhale 2 puffs into the lungs every 6 hours as needed for Wheezing 12/27/15  Yes Harvel Quale, MD   Blood Pressure Monitor KIT Use daily as needed 12/20/15  Yes Harvel Quale, MD   hydrALAZINE (APRESOLINE) 25 MG tablet Take 1 tablet by mouth 3 times daily 11/12/15  Yes Harvel Quale, MD   metoprolol (LOPRESSOR) 100 MG tablet Take 1.5 tablets by mouth 2 times daily 09/26/15  Yes Harvel Quale, MD   ondansetron [facility]) 4 MG tablet Take 1 tablet by mouth daily as needed for Nausea or Vomiting 06/03/15  Yes Lucky Rathke Rayford Halsted, MD   venlafaxine (EFFEXOR XR) 150 MG XR capsule Take 1 capsule by mouth daily 06/03/15  Yes Harvel Quale, MD   CALCIUM-VITAMIN D PO Take by mouth   Yes Historical Provider, MD   omeprazole (PRILOSEC) 40 MG capsule Take 1 capsule by mouth daily. 07/21/13  Yes Norwood Levo, MD   cetirizine (ZYRTEC ALLERGY) 10 MG tablet Take 10 mg by mouth daily.   Yes Historical Provider, MD   Blood Pressure KIT 1 each by Does not apply route once for 1 dose 03/23/16 03/23/16  Harvel Quale, MD   Multiple Vitamins-Minerals (MULTIVITAMIN PO) Take by mouth    Historical Provider, MD        Allergies:  Morphine and Talwin [pentazocine]     Review of Systems:   A 10 point review of systems is negative except as noted in the history of present illness.      Physical Examination:    Vitals:    03/30/16 0738   BP: 114/80   Pulse: 54   SpO2: 97%        Constitutional and General Appearance: NAD   Respiratory:  ?? Normal excursion and expansion without use of accessory muscles  ?? Resp Auscultation: Normal breath sounds without dullness  Cardiovascular:  ?? The apical impulses not displaced  ?? JVD is normal  ?? The carotid upstroke is normal in  amplitude and contour without delay or bruit  ?? Normal S1S2, No S3, No Murmur  ?? Peripheral pulses are symmetrical and full  ?? There is no clubbing, cyanosis of the extremities.  ?? Trace edema  ?? Pedal Pulses: 2+ and equal   Abdomen:  ?? No masses or tenderness  ?? Liver/Spleen: No Abnormalities Noted  Neurological/Psychiatric:  ?? Alert and oriented in all spheres  ?? Moves all extremities well  ?? Exhibits normal gait balance and coordination  ?? No abnormalities of mood, affect, memory, mentation, or behavior are noted    CTPA 03/25/16:  No evidence of pulmonary embolism or acute  pulmonary abnormality.    Chest x-ray 03/25/16:  No acute process.    ECG 25-Mar-2016 17:28:21 [facility]:  Sinus bradycardia  Otherwise normal ECG  No significant change    CTPA 03/10/16:  No evidence of pulmonary embolus.   Small right and trace left pleural effusions, new as well as interlobular   septal wall thickening. ??Findings likely related to edema.   Mild mediastinal adenopathy, new. ??Findings may be reactive. ??Recommend   follow-up to ensure resolution.         Chest x-ray 02/13/16:  FINDINGS:   Cardiomegaly. ??There appears be pulmonary vascular congestion and   interstitial edema. ??No focal airspace disease.   ??     ??   Impression   Findings suggest congestive heart failure   ??     Echo 10/07/15:  Summary  ??Technically limited study due to body habitus.  ??Normal left ventricle size and systolic function with an estimated??ejection fraction of 55%. No regional wall motion abnormalities are seen.  ??There is mild concentric left ventricular hypertrophy.    Cardiac Cath 09/20/15:  Anatomy:   LM-normal  LAD-normal  Cx-normal  OM1- normal  RCA-normal, codominant  RPDA- normal  LVEF- 60%  ??  Impression:  1. Normal coronaries  2. Normal LV systolic function  3. False positive stress test      Myoview Stress Test  09/18/15:  Summary   ??-Moderate sized anterior mostly reversible defect consistent with ischemia   ??in the territory of the mid LAD .    ??-Normal LV function.   ??-Study is limited by breast attenuation.     Cardiac cath 06/03/12:  Normal coronaries and LVEF       Assessment:   1. Shortness of breath    2. Other chest pain    3. Acute diastolic congestive heart failure (HCC)            Plan:  Right and left cardiac angiogram to evaluate coronary and pulmonary artery.  Will refer to Dr. Della Goo pulmonology for another opinion of shortness of breath.  Discussed cardiac angiogram.  Good and pertinent questions asked and answered.  Follow up afterwards.    Thank you for allowing me to participate in the care of this individual.      Stephannie Li. Susy Manor, M.D., [facility]

## 2016-03-31 LAB — EKG 12-LEAD
Atrial Rate: 58 {beats}/min
Atrial Rate: 64 {beats}/min
Diagnosis: NORMAL
P Axis: -2 degrees
P Axis: 44 degrees
P-R Interval: 136 ms
P-R Interval: 148 ms
Q-T Interval: 438 ms
Q-T Interval: 442 ms
QRS Duration: 78 ms
QRS Duration: 76 ms
QTc Calculation (Bazett): 429 ms
QTc Calculation (Bazett): 455 ms
R Axis: 2 degrees
R Axis: -1 degrees
T Axis: 40 degrees
T Axis: 31 degrees
Ventricular Rate: 58 {beats}/min
Ventricular Rate: 64 {beats}/min

## 2016-03-31 LAB — WBC: WBC: 6.8 10*3/uL (ref 4.0–11.0)

## 2016-03-31 LAB — PLATELET COUNT: Platelets: 152 10*3/uL (ref 135–450)

## 2016-03-31 MED ORDER — CETIRIZINE HCL 10 MG PO TABS
10 MG | Freq: Every day | ORAL | Status: DC
Start: 2016-03-31 — End: 2016-04-02
  Administered 2016-03-31 – 2016-04-02 (×3): 10 mg via ORAL

## 2016-03-31 MED ORDER — FENTANYL CITRATE (PF) 100 MCG/2ML IJ SOLN
100 | INTRAMUSCULAR | Status: AC
Start: 2016-03-31 — End: 2016-03-31

## 2016-03-31 MED ORDER — ALBUTEROL SULFATE HFA 108 (90 BASE) MCG/ACT IN AERS
108 (90 Base) MCG/ACT | Freq: Four times a day (QID) | RESPIRATORY_TRACT | Status: DC | PRN
Start: 2016-03-31 — End: 2016-04-02

## 2016-03-31 MED ORDER — SODIUM CHLORIDE 0.9 % IV SOLN
0.9 | INTRAVENOUS | Status: AC
Start: 2016-03-31 — End: 2016-03-31

## 2016-03-31 MED ORDER — TIROFIBAN HCL IN NACL 5-0.9 MG/100ML-% IV SOLN
INTRAVENOUS | Status: AC
Start: 2016-03-31 — End: 2016-04-01
  Administered 2016-03-31 – 2016-04-01 (×2): 0.15 ug/kg/min via INTRAVENOUS

## 2016-03-31 MED ORDER — ALPRAZOLAM 0.25 MG PO TABS
0.25 MG | Freq: Two times a day (BID) | ORAL | Status: DC | PRN
Start: 2016-03-31 — End: 2016-04-02
  Administered 2016-03-31 – 2016-04-01 (×3): 0.25 mg via ORAL

## 2016-03-31 MED ORDER — ADENOSINE (DIAGNOSTIC) 3 MG/ML IV SOLN
3 | INTRAVENOUS | Status: AC
Start: 2016-03-31 — End: 2016-03-31

## 2016-03-31 MED ORDER — NITROGLYCERIN IN D5W 200-5 MCG/ML-% IV SOLN
200-5 | INTRAVENOUS | Status: AC
Start: 2016-03-31 — End: 2016-03-31

## 2016-03-31 MED ORDER — HEPARIN (PORCINE) IN NACL 2-0.9 UNIT/ML-% IJ SOLN
2-0.9 | INTRAMUSCULAR | Status: AC
Start: 2016-03-31 — End: 2016-03-31

## 2016-03-31 MED ORDER — TRAMADOL HCL 50 MG PO TABS
50 MG | Freq: Three times a day (TID) | ORAL | Status: DC | PRN
Start: 2016-03-31 — End: 2016-04-02
  Administered 2016-03-31: 23:00:00 100 mg via ORAL

## 2016-03-31 MED ORDER — MAGNESIUM HYDROXIDE 400 MG/5ML PO SUSP
400 MG/5ML | Freq: Every day | ORAL | Status: DC | PRN
Start: 2016-03-31 — End: 2016-04-02

## 2016-03-31 MED ORDER — POTASSIUM CHLORIDE CRYS ER 20 MEQ PO TBCR
20 MEQ | Freq: Every day | ORAL | Status: DC
Start: 2016-03-31 — End: 2016-04-02
  Administered 2016-03-31 – 2016-04-02 (×3): 20 meq via ORAL

## 2016-03-31 MED ORDER — MIDAZOLAM HCL 2 MG/2ML IJ SOLN
2 | INTRAMUSCULAR | Status: AC
Start: 2016-03-31 — End: 2016-03-31

## 2016-03-31 MED ORDER — ASPIRIN 81 MG PO TBEC
81 MG | Freq: Every day | ORAL | Status: DC
Start: 2016-03-31 — End: 2016-04-02
  Administered 2016-04-01 – 2016-04-02 (×2): 81 mg via ORAL

## 2016-03-31 MED ORDER — LISINOPRIL 20 MG PO TABS
20 MG | Freq: Every day | ORAL | Status: DC
Start: 2016-03-31 — End: 2016-04-02
  Administered 2016-03-31 – 2016-04-02 (×3): 40 mg via ORAL

## 2016-03-31 MED ORDER — ACETAMINOPHEN 325 MG PO TABS
325 MG | ORAL | Status: DC | PRN
Start: 2016-03-31 — End: 2016-04-02
  Administered 2016-04-01 – 2016-04-02 (×2): 650 mg via ORAL

## 2016-03-31 MED ORDER — ONDANSETRON 4 MG PO TBDP
4 MG | Freq: Every day | ORAL | Status: DC | PRN
Start: 2016-03-31 — End: 2016-04-02

## 2016-03-31 MED ORDER — PRASUGREL HCL 10 MG PO TABS
10 | ORAL | Status: AC
Start: 2016-03-31 — End: 2016-03-31

## 2016-03-31 MED ORDER — NORMAL SALINE FLUSH 0.9 % IV SOLN
0.9 % | INTRAVENOUS | Status: DC | PRN
Start: 2016-03-31 — End: 2016-04-02
  Administered 2016-03-31 (×2): 10 mL via INTRAVENOUS

## 2016-03-31 MED ORDER — NORMAL SALINE FLUSH 0.9 % IV SOLN
0.9 % | Freq: Two times a day (BID) | INTRAVENOUS | Status: DC
Start: 2016-03-31 — End: 2016-04-02
  Administered 2016-04-01 – 2016-04-02 (×4): 10 mL via INTRAVENOUS

## 2016-03-31 MED ORDER — TIROFIBAN HCL IN NACL 12.5-0.9 MG/250ML-% IV SOLN
12.5-0.9 | INTRAVENOUS | Status: AC
Start: 2016-03-31 — End: 2016-03-31

## 2016-03-31 MED ORDER — POTASSIUM CHLORIDE 20 MEQ/15ML (10%) PO SOLN
20 MEQ/15ML (10%) | ORAL | Status: DC | PRN
Start: 2016-03-31 — End: 2016-04-02

## 2016-03-31 MED ORDER — VENLAFAXINE HCL ER 150 MG PO CP24
150 MG | Freq: Every day | ORAL | Status: DC
Start: 2016-03-31 — End: 2016-04-02
  Administered 2016-04-01 – 2016-04-02 (×2): 150 mg via ORAL

## 2016-03-31 MED ORDER — POTASSIUM CHLORIDE CRYS ER 20 MEQ PO TBCR
20 MEQ | ORAL | Status: DC | PRN
Start: 2016-03-31 — End: 2016-04-02

## 2016-03-31 MED ORDER — LIDOCAINE HCL (PF) 1 % IJ SOLN
1 | INTRAMUSCULAR | Status: AC
Start: 2016-03-31 — End: 2016-03-31

## 2016-03-31 MED ORDER — PRASUGREL HCL 10 MG PO TABS
10 MG | Freq: Every day | ORAL | Status: DC
Start: 2016-03-31 — End: 2016-04-02
  Administered 2016-04-01 – 2016-04-02 (×2): 10 mg via ORAL

## 2016-03-31 MED ORDER — ATORVASTATIN CALCIUM 80 MG PO TABS
80 MG | Freq: Every evening | ORAL | Status: DC
Start: 2016-03-31 — End: 2016-04-02
  Administered 2016-04-01 – 2016-04-02 (×2): 80 mg via ORAL

## 2016-03-31 MED ORDER — PANTOPRAZOLE SODIUM 40 MG PO TBEC
40 MG | Freq: Every day | ORAL | Status: DC
Start: 2016-03-31 — End: 2016-04-02
  Administered 2016-04-01 – 2016-04-02 (×2): 40 mg via ORAL

## 2016-03-31 MED ORDER — HEPARIN SODIUM (PORCINE) 1000 UNIT/ML IJ SOLN
1000 | INTRAMUSCULAR | Status: AC
Start: 2016-03-31 — End: 2016-03-31

## 2016-03-31 MED ORDER — TRAZODONE HCL 50 MG PO TABS
50 MG | Freq: Every evening | ORAL | Status: DC
Start: 2016-03-31 — End: 2016-04-02
  Administered 2016-04-01 – 2016-04-02 (×2): 100 mg via ORAL

## 2016-03-31 MED ORDER — ONDANSETRON HCL 4 MG/2ML IJ SOLN
4 MG/2ML | Freq: Four times a day (QID) | INTRAMUSCULAR | Status: DC | PRN
Start: 2016-03-31 — End: 2016-04-02
  Administered 2016-03-31 – 2016-04-01 (×2): 4 mg via INTRAVENOUS

## 2016-03-31 MED ORDER — HYDRALAZINE HCL 25 MG PO TABS
25 MG | Freq: Three times a day (TID) | ORAL | Status: DC
Start: 2016-03-31 — End: 2016-04-02
  Administered 2016-03-31 – 2016-04-02 (×6): 25 mg via ORAL

## 2016-03-31 MED ORDER — METOPROLOL TARTRATE 50 MG PO TABS
50 MG | Freq: Two times a day (BID) | ORAL | Status: DC
Start: 2016-03-31 — End: 2016-04-02
  Administered 2016-03-31 – 2016-04-02 (×3): 150 mg via ORAL

## 2016-03-31 MED ORDER — MONTELUKAST SODIUM 10 MG PO TABS
10 MG | Freq: Every evening | ORAL | Status: DC
Start: 2016-03-31 — End: 2016-04-02
  Administered 2016-04-01 – 2016-04-02 (×2): 10 mg via ORAL

## 2016-03-31 MED ORDER — HYDROMORPHONE HCL 1 MG/ML IJ SOLN
1 MG/ML | Freq: Once | INTRAMUSCULAR | Status: AC
Start: 2016-03-31 — End: 2016-03-31
  Administered 2016-03-31: 19:00:00 1 mg via INTRAVENOUS

## 2016-03-31 MED ORDER — FUROSEMIDE 10 MG/ML IJ SOLN
10 MG/ML | Freq: Two times a day (BID) | INTRAMUSCULAR | Status: DC
Start: 2016-03-31 — End: 2016-04-02
  Administered 2016-04-01 – 2016-04-02 (×4): 40 mg via INTRAVENOUS

## 2016-03-31 MED ORDER — POTASSIUM CHLORIDE 10 MEQ/100ML IV SOLN
10 MEQ/0ML | INTRAVENOUS | Status: DC | PRN
Start: 2016-03-31 — End: 2016-04-02

## 2016-03-31 MED ORDER — BLOOD PRESSURE KIT
Freq: Once | Status: DC
Start: 2016-03-31 — End: 2016-03-31

## 2016-03-31 MED ORDER — BUSPIRONE HCL 5 MG PO TABS
5 MG | Freq: Three times a day (TID) | ORAL | Status: DC
Start: 2016-03-31 — End: 2016-04-02
  Administered 2016-03-31 – 2016-04-02 (×6): 10 mg via ORAL

## 2016-03-31 MED FILL — MIDAZOLAM HCL 2 MG/2ML IJ SOLN: 2 MG/ML | INTRAMUSCULAR | Qty: 2

## 2016-03-31 MED FILL — HEPARIN SODIUM (PORCINE) 1000 UNIT/ML IJ SOLN: 1000 UNIT/ML | INTRAMUSCULAR | Qty: 10

## 2016-03-31 MED FILL — ALPRAZOLAM 0.25 MG PO TABS: 0.25 MG | ORAL | Qty: 1

## 2016-03-31 MED FILL — TRAMADOL HCL 50 MG PO TABS: 50 MG | ORAL | Qty: 2

## 2016-03-31 MED FILL — ONDANSETRON HCL 4 MG/2ML IJ SOLN: 4 MG/2ML | INTRAMUSCULAR | Qty: 2

## 2016-03-31 MED FILL — NITROGLYCERIN IN D5W 200-5 MCG/ML-% IV SOLN: 200-5 MCG/ML-% | INTRAVENOUS | Qty: 250

## 2016-03-31 MED FILL — METOPROLOL TARTRATE 50 MG PO TABS: 50 MG | ORAL | Qty: 3

## 2016-03-31 MED FILL — NORMAL SALINE FLUSH 0.9 % IV SOLN: 0.9 % | INTRAVENOUS | Qty: 10

## 2016-03-31 MED FILL — AGGRASTAT 5-0.9 MG/100ML-% IV SOLN: INTRAVENOUS | Qty: 100

## 2016-03-31 MED FILL — FENTANYL CITRATE (PF) 100 MCG/2ML IJ SOLN: 100 MCG/2ML | INTRAMUSCULAR | Qty: 2

## 2016-03-31 MED FILL — BUSPIRONE HCL 5 MG PO TABS: 5 MG | ORAL | Qty: 2

## 2016-03-31 MED FILL — HYDRALAZINE HCL 25 MG PO TABS: 25 MG | ORAL | Qty: 1

## 2016-03-31 MED FILL — KLOR-CON M20 20 MEQ PO TBCR: 20 MEQ | ORAL | Qty: 1

## 2016-03-31 MED FILL — CETIRIZINE HCL 10 MG PO TABS: 10 MG | ORAL | Qty: 1

## 2016-03-31 MED FILL — EFFIENT 10 MG PO TABS: 10 MG | ORAL | Qty: 6

## 2016-03-31 MED FILL — LIDOCAINE HCL (PF) 1 % IJ SOLN: 1 % | INTRAMUSCULAR | Qty: 30

## 2016-03-31 MED FILL — AGGRASTAT 12.5-0.9 MG/250ML-% IV SOLN: INTRAVENOUS | Qty: 250

## 2016-03-31 MED FILL — HEPARIN (PORCINE) IN NACL 2-0.9 UNIT/ML-% IJ SOLN: INTRAMUSCULAR | Qty: 1000

## 2016-03-31 MED FILL — LISINOPRIL 20 MG PO TABS: 20 MG | ORAL | Qty: 2

## 2016-03-31 MED FILL — PROAIR HFA 108 (90 BASE) MCG/ACT IN AERS: 108 (90 Base) MCG/ACT | RESPIRATORY_TRACT | Qty: 8.5

## 2016-03-31 MED FILL — SODIUM CHLORIDE 0.9 % IV SOLN: 0.9 % | INTRAVENOUS | Qty: 50

## 2016-03-31 MED FILL — HYDROMORPHONE HCL 1 MG/ML IJ SOLN: 1 MG/ML | INTRAMUSCULAR | Qty: 1

## 2016-03-31 MED FILL — SODIUM CHLORIDE 0.9 % IV SOLN: 0.9 % | INTRAVENOUS | Qty: 1000

## 2016-03-31 MED FILL — ADENOSCAN 3 MG/ML IV SOLN: 3 MG/ML | INTRAVENOUS | Qty: 30

## 2016-03-31 NOTE — Progress Notes (Signed)
Patient brought over to CVU from cath lab and attached to CVU monitoring.  Report reviewed with cath lab RN.  right groin soft and no hematoma or oozing noted.  Occlusive dressing dry and intact.  Pedal pulses easily palpated.  Primary RN Cherlyn Roberts.

## 2016-03-31 NOTE — Progress Notes (Signed)
Pt reports 9/10 anterior chest pain radiating to left arm. "Aches." Also reports 9/10 lower back pain, nausea, and "a little bit" of shortness of breath. O2 sat 94% on RA. Bed placed in reverse trendelenburg. Pt NPO post procedure. Sheath in right groin. Call placed to Dr. Susy Manor for IV pain med. Spoke with Andee Poles, RN, Cardiology Coordinator.

## 2016-03-31 NOTE — Progress Notes (Signed)
Pt reports persistent pain 10/10. Dilaudid administered.

## 2016-03-31 NOTE — Progress Notes (Signed)
RESPIRATORY THERAPY ASSESSMENT    Rachael Mays   [id number]  1956/06/24      Recent Surgical Procedures:  none  Pulmonary History:  asthma  Home Respiratory Therapy:  PRN albuterol 2 puffs  Home Oxygen Therapy:  none  Current Respiratory Therapy:  2 puffs PRN albuterol   Vitals:    03/31/16 1500   BP: (!) 151/89   Pulse: 60   Resp: 14   Temp:    SpO2: 97%       MPVC: uta  Actual VC: uta  Sputum:  ,  ,      ASSESSMENT OF RESPIRATORY SEVERITY INDEX (RSI)    Patients with orders for inhalation medications, oxygen, or any therapeutic treatment modality will be placed on Respiratory Protocol.  They will be assessed with the first treatment and at least every 72 hours thereafter.  The following severity scale will be used to determine frequency of treatment intervention.    Respiratory History: Smoking History Less than 1ppd or less than 15 pack year = 1    Recent Surgical History: None = 0    Level of Consciousness: Alert, Oriented, and Cooperative = 0    Level of Activity: Walking with assistance = 1    Respiratory Pattern: Regular Pattern; RR 8-20 = 0    Breath Sounds: Clear = 0    Cough: Strong, productive = 1    SPO2 (COPD values may differ): Greater than or equal to 92% on room air = 0    Peak Flow (asthma only): not applicable = 0    RSI: 3  PLAN    Goals: medication delivery    Patient educated on: Disease Process     Comment / Outcomes: continue home regimine     Plan of Care: continue home regimine and support pt needs    Is patient being placed on Home Treatment Regimen? Yes      Respiratory Protocol Guidelines    1. Assessment and treatment by Respiratory Therapy will be initiated for medication and therapeutic interventions upon initiation of aerosolized medication.  2. Physician will be contacted for respiratory rate (RR) greater than 35 breaths per minute. Therapy will be held for heart rate (HR) greater than 140 beats per minute, pending direction from physician.  3. Bronchodilators will be  administered via Metered Dose Inhaler (MDI) or Hydrofluoroalkane (HFA) with spacer when the following criteria are met:  a. Alert and cooperative     b. HR < 140 bpm  c. RR < 30 bpm                d. Can demonstrate a 2???3 second inspiratory hold  4. Bronchodilators will be administered via Hand Held Nebulizer [facility]) to patients when ANY of the following criteria are met  a. Incogizant or uncooperative          b. Patients treated with HHN at Home        c. Unable to demonstrate proper MDI or HFA technique     d. RR > 30 bpm   5. Bronchodilators will be delivered via Metered Dose Inhaler (MDI) or Hydrofluoroalkane (HFA) with spacer to intubated patients on mechanical ventilation.  6. Inhalation medication orders will be delivered and/or substituted as outlined below.    Aerosolized Medications Ordering and Administration Guidelines:    1. All Medications will be ordered by a physician, and their frequency and/or modality will be adjusted as defined by the patients Respiratory Severity Index (RSI) score.  2. If the patient does not have documented COPD, consider discontinuing anticholinergics when RSI is less than 9.  3. If the bronchospasm worsens (increased RSI), then the bronchodilator frequency can be increased to a maximum of every 4 hours.  If greater than every 4 hours is required, the physician will be contacted.  4. If the bronchospasm improves, the frequency of the bronchodilator can be decreased, based on the patient's RSI, but not less than home treatment regimen frequency.  5. Bronchodilator(s) will be discontinued if patient has a RSI less than 9 and has received no scheduled or as needed treatment for 72  Hrs.    Patients Ordered on a Mucolytic Agent:    1. Must always be administered with a bronchodilator.    2. Discontinue if patient experiences worsened bronchospasm, or secretions have lessened to the point that the patient is able to clear them with a cough.    Anti-inflammatory and Combination  Medications:    1. If the patient lacks prior history of lung disease, is not using inhaled anti-inflammatory medication at home, and lacks wheezing by examination or by history for at least 24 hours, contact physician for possible discontinuation.

## 2016-03-31 NOTE — Brief Op Note (Signed)
Patient:  EMIRETH SAFRIT   DOB:   Sep 01, 1956    A pre-sedation re-evaluation was performed immediately prior to beginning of  the procedure.  Procedure: Right and left heart cath, PCI, ultrasound guided venous access  Medications: Procedural sedation with minimal conscious sedation  Complications: None  Estimated Blood Loss: Less than 25 ML  Specimens: Were not obtained    Cardiac Cath 03/31/16:  Anatomy:   LM-normal   LAD-70% mid with FFR 0.77  Cx-normal, codominant  OM1- normal  RCA-normal  RPDA- normal  LVEF- 70%, LVEDP 19   PCI: LAD 70% to 0% with 3.25 mm x 18 mm Xience Alpine drug-eluting stent    Hemodynamics:  RA-15/12 mean 9  RV- 60/0, 9  PAWP-20/26 mean 19  PA- 60/12 mean 33      Impression:  1.  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  2.  Hyperdynamic LV systolic function.  3.  Mildly decompensated diastolic CHF.  4.  Moderate pulmonary hypertension.    Plan:  1.  Aspirin indefinitely.  2.  Effient/equivalent for minimum of 1 year.  3.  IV diuresis while in the hospital and then discharged on Lasix 40 mg by mouth twice a day.  4.  Continue BiPAP therapy.  5.  Outpatient cardiac rehab.  6.  CHF service follow-up on discharge.

## 2016-03-31 NOTE — Progress Notes (Signed)
Pt repositioned for back pain. Zofran administered for nausea.

## 2016-03-31 NOTE — Progress Notes (Signed)
ACT 183.

## 2016-03-31 NOTE — H&P (Signed)
[place]   Cardiac Follow up    Referring Provider:  Acquanetta Sit SLOUGH, MD     Chief complaint:  Shortness of breath       History of Present Illness:   Ms. Levenhagen presents for evaluation for chest pain and shortness of breath. She had normal coronaries and grams in October 2016 in June 2013.  She was doing okay until around Christmas and she had swelling at that time that progressed to generalized edema by Valentine's day. She had worsening associated shortness of breath. Her chest pain is constant  aching and sharp at times. She has generalized weakness. In comparison to chest pain she had when she got the angiogram it's sharper. Lab result reviewed and Pro bnp 521. Sh has morbid obesity. She was seen by Dr. Jeb Levering who did a follow up CTA of her chest and CTPA  for continued shortness of breath 03/25/16 that showed no evidence of pulmonary embolism or acute pulmonary abnormality. A chest xray on the same day suggested congestive heart failure.  Her diuretics were increased and her swelling improved but she essentially had no improvement in her shortness of breath and chest pain.        Past Medical History:   has a past medical history of Anxiety; Asthma; Chronic back pain; Deep vein thrombosis; Depression; GERD (gastroesophageal reflux disease); GERD (gastroesophageal reflux disease); Hyperlipidemia; Hypertension; Obesity; Obstructive sleep apnea (adult) (pediatric); and Pulmonary embolism ([place]).    Surgical History:   has a past surgical history that includes Colonoscopy; Cesarean section; Cholecystectomy; Hysterectomy; shoulder surgery; Lap Band (05/01/08); back surgery; Colonoscopy (04/08/10); other surgical history; Upper gastrointestinal endoscopy (05/19/13); other surgical history (07/05/13); Upper gastrointestinal endoscopy (07/21/13); partial hysterectomy (cervix not removed); Upper gastrointestinal endoscopy (N/A, 07/31/2014); and vascular surgery (04/2015).     Social History:   reports that  she quit smoking about 3 years ago. Her smoking use included Cigarettes. She has a 20.00 pack-year smoking history. She has never used smokeless tobacco. She reports that she drinks alcohol. She reports that she does not use illicit drugs.     Family History:  family history includes Arthritis in her mother; Cancer in her mother; Cancer (age of onset: 73) in her father; Depression in her mother; Diabetes in an other family member; Early Death in her paternal grandmother; Heart Disease in her maternal grandmother and paternal grandfather; Heart Disease (age of onset: 8) in her mother; High Blood Pressure in her mother and another family member; Obesity in an other family member; Other in her brother, brother, daughter, and sister; Ovarian Cancer (age of onset: 3) in her mother.     Home Medications:  Prior to Admission medications    Medication Sig Start Date End Date Taking? Authorizing Provider   traMADol (ULTRAM) 50 MG tablet Take 100 mg by mouth every 8 hours as needed for Pain   Yes Historical Provider, MD   furosemide (LASIX) 40 MG tablet Take 1 tablet by mouth 2 times daily 03/27/16  Yes Leodis Binet, MD   potassium chloride (KLOR-CON M) 20 MEQ extended release tablet Take 1 tablet by mouth daily 03/27/16  Yes Leodis Binet, MD   ALPRAZolam Duanne Moron) 0.25 MG tablet Take 1 tablet by mouth 2 times daily as needed for Anxiety 03/23/16  Yes Harvel Quale, MD   busPIRone (BUSPAR) 10 MG tablet Take 1 tablet by mouth 3 times daily 03/23/16  Yes Harvel Quale, MD   montelukast [facility])  10 MG tablet TAKE ONE TABLET BY MOUTH ONE TIME A DAY 03/18/16  Yes Jamal Collin, MD   lisinopril (PRINIVIL;ZESTRIL) 40 MG tablet Take 40 mg by mouth daily   Yes Historical Provider, MD   traZODone (DESYREL) 100 MG tablet TAKE 1 TABLET BY MOUTH NIGHTLY 01/02/16  Yes Harvel Quale, MD   albuterol sulfate HFA (PROVENTIL HFA) 108 (90 BASE) MCG/ACT inhaler Inhale 2 puffs into the lungs every 6 hours as  needed for Wheezing 12/27/15  Yes Harvel Quale, MD   hydrALAZINE (APRESOLINE) 25 MG tablet Take 1 tablet by mouth 3 times daily 11/12/15  Yes Harvel Quale, MD   metoprolol (LOPRESSOR) 100 MG tablet Take 1.5 tablets by mouth 2 times daily 09/26/15  Yes Harvel Quale, MD   ondansetron [facility]) 4 MG tablet Take 1 tablet by mouth daily as needed for Nausea or Vomiting 06/03/15  Yes Lucky Rathke Rayford Halsted, MD   venlafaxine (EFFEXOR XR) 150 MG XR capsule Take 1 capsule by mouth daily 06/03/15  Yes Harvel Quale, MD   Multiple Vitamins-Minerals (MULTIVITAMIN PO) Take by mouth   Yes Historical Provider, MD   CALCIUM-VITAMIN D PO Take by mouth   Yes Historical Provider, MD   omeprazole (PRILOSEC) 40 MG capsule Take 1 capsule by mouth daily. 07/21/13  Yes Norwood Levo, MD   cetirizine (ZYRTEC ALLERGY) 10 MG tablet Take 10 mg by mouth daily.   Yes Historical Provider, MD   triamcinolone (KENALOG) 0.1 % cream Apply to affected areas twice daily for up to 2 weeks or until improved. 03/23/16   Francisca December, MD   Blood Pressure KIT 1 each by Does not apply route once for 1 dose 03/23/16 03/23/16  Harvel Quale, MD   Blood Pressure Monitor KIT Use daily as needed 12/20/15   Harvel Quale, MD        Allergies:  Morphine and Talwin [pentazocine]     Review of Systems:   A 10 point review of systems is negative except as noted in the history of present illness.      Physical Examination:    Vitals:    03/31/16 0800   Weight: (!) 306 lb (138.8 kg)   Height: '5\' 6"'  (1.676 m)       Constitutional and General Appearance: NAD   Respiratory:  ?? Normal excursion and expansion without use of accessory muscles  ?? Resp Auscultation: Normal breath sounds without dullness  Cardiovascular:  ?? The apical impulses not displaced  ?? JVD is normal  ?? The carotid upstroke is normal in amplitude and contour without delay or bruit  ?? Normal S1S2, No S3, No Murmur  ?? Peripheral pulses are  symmetrical and full  ?? There is no clubbing, cyanosis of the extremities.  ?? Trace edema  ?? Pedal Pulses: 2+ and equal   Abdomen:  ?? No masses or tenderness  ?? Liver/Spleen: No Abnormalities Noted  Neurological/Psychiatric:  ?? Alert and oriented in all spheres  ?? Moves all extremities well  ?? Exhibits normal gait balance and coordination  ?? No abnormalities of mood, affect, memory, mentation, or behavior are noted    CTPA 03/25/16:  No evidence of pulmonary embolism or acute pulmonary abnormality.    Chest x-ray 03/25/16:  No acute process.    ECG 25-Mar-2016 17:28:21 [place]:  Sinus bradycardia  Otherwise normal ECG  No significant change    CTPA 03/10/16:  No evidence of pulmonary embolus.   Small right and trace left pleural effusions, new as well as interlobular   septal wall thickening. ??Findings likely related to edema.   Mild mediastinal adenopathy, new. ??Findings may be reactive. ??Recommend   follow-up to ensure resolution.         Chest x-ray 02/13/16:  FINDINGS:   Cardiomegaly. ??There appears be pulmonary vascular congestion and   interstitial edema. ??No focal airspace disease.   ??     ??   Impression   Findings suggest congestive heart failure   ??     Echo 10/07/15:  Summary  ??Technically limited study due to body habitus.  ??Normal left ventricle size and systolic function with an estimated??ejection fraction of 55%. No regional wall motion abnormalities are seen.  ??There is mild concentric left ventricular hypertrophy.    Cardiac Cath 09/20/15:  Anatomy:   LM-normal  LAD-normal  Cx-normal  OM1- normal  RCA-normal, codominant  RPDA- normal  LVEF- 60%  ??  Impression:  1. Normal coronaries  2. Normal LV systolic function  3. False positive stress test      Myoview Stress Test  09/18/15:  Summary   ??-Moderate sized anterior mostly reversible defect consistent with ischemia   ??in the territory of the mid LAD .   ??-Normal LV function.   ??-Study is limited by breast attenuation.     Cardiac cath 06/03/12:  Normal  coronaries and LVEF       Assessment:   1.  Acute diastolic CHF  2.  Shortness of breath      Plan:  Right and left cardiac angiogram to evaluate coronary and pulmonary artery.  Will refer to Dr. Della Goo pulmonology for another opinion of shortness of breath.  Discussed cardiac angiogram.  Good and pertinent questions asked and answered.  Follow up afterwards.    Thank you for allowing me to participate in the care of this individual.      Stephannie Li. Susy Manor, M.D., [facility]    Patient seen and examined.  H&P reviewed and no changes.    Hilary Hertz.D.

## 2016-03-31 NOTE — Progress Notes (Signed)
2nd call placed to Dr. Susy Manor re: pain. Spoke with Physicist, medical. States she Spoke with Dr. Susy Manor who stated he is "thinking about it, and will call."

## 2016-03-31 NOTE — Pre Sedation (Signed)
Brief Pre-Op Note/Sedation Assessment      Rachael Mays  1956/04/19  [id number]  [id number]  9:45 AM    Planned Procedure: Cardiac Catheterization Procedure    Post Procedure Plan: Return to same level of care    Consent: I have discussed with the patient and/or the patient representative the indication, alternatives, and the possible risks and/or complications of the planned procedure and the anesthesia methods. The patient and/or patient representative appear to understand and agree to proceed.    Chief Complaint: Dyspnea on Exertion  Dyspnea      Indications for the Procedure:   CAD Presentation:  Shortness of breath  Anginal Classification within 2 weeks:  CCS III - Symptoms with everyday living activities, i.e., moderate limitation  NYHA Heart Failure Class within 2 weeks: CCS III - Symptoms of HF on less-than-ordinary exertion       Anti- Anginal Meds within 2 weeks:   ANTI-ANGINAL MEDS: Yes: Beta Blockers      Stress or Imaging Studies Performed:  None    Vital Signs:  Ht 5\' 6"  (1.676 m)   Wt (!) 306 lb (138.8 kg)   BMI 49.39 kg/m2    Allergies:  Allergies   Allergen Reactions   ??? Morphine Itching   ??? Talwin [Pentazocine] Other (See Comments)     dizzy       Past Medical History:  Past Medical History:   Diagnosis Date   ??? Anxiety    ??? Asthma    ??? Chronic back pain    ??? Deep vein thrombosis    ??? Depression    ??? GERD (gastroesophageal reflux disease)     NO LONGER SINCE LAP   ??? GERD (gastroesophageal reflux disease) 01/23/2009   ??? Hyperlipidemia     hx; resolved with lap band   ??? Hypertension    ??? Obesity     hx of; had lap band   ??? Obstructive sleep apnea (adult) (pediatric) 09/18/2015   ??? Pulmonary embolism [facility])          Surgical History:  Past Surgical History:   Procedure Laterality Date   ??? BACK SURGERY      neck  plates   ??? CESAREAN SECTION     ??? CHOLECYSTECTOMY     ??? COLONOSCOPY     ??? COLONOSCOPY  04/08/10   ??? HYSTERECTOMY     ??? LAP BAND  05/01/08    Dr. Nicole Cella   ??? OTHER SURGICAL HISTORY       [place] Filter: curently in place   ??? OTHER SURGICAL HISTORY  07/05/13    lap band removal   ??? PARTIAL HYSTERECTOMY     ??? SHOULDER SURGERY     ??? UPPER GASTROINTESTINAL ENDOSCOPY  05/19/13   ??? UPPER GASTROINTESTINAL ENDOSCOPY  07/21/13    ESOPHAGEAL STENT PLACEMENT   ??? UPPER GASTROINTESTINAL ENDOSCOPY N/A 07/31/2014    Esophagogastroduodenoscopy with esophageal balloon dilation   ??? VASCULAR SURGERY  04/2015    Lucendia Herrlich, celiac artery angiogram, normal abd arteries         Medications:  No current facility-administered medications for this encounter.            Pre-Sedation:    Pre-Sedation Documentation and Exam:  I have personally completed a history, physical exam & review of systems for this patient (see notes).  I have assessed the patient and agree with the H&P present on the chart.    Prior History of Anesthesia Complications:   none  Modified Mallampati:  II (soft palate, uvula, fauces visible)    ASA Classification:  Class 2 -- A normal healthy patient with mild systemic disease    Aldrete Scale:  Activity:  2 - Able to move 4 extremities voluntarily on command  Respiration:  2 - Able to breathe deeply and cough freely  Circulation:  2 - BP+/- 32mmHg of normal  Consciousness:  2 - Fully awake  Oxygen Saturation (color):  2 - Able to maintain oxygen saturation >92% on room air    Sedation/Anesthesia Plan:  Guard the patient's safety and welfare.  Minimize physical discomfort and pain.  Minimize negative psychological responses to treatment by providing sedation and analgesia and maximize the potential amnesia.  Patient to meet pre-procedure discharge plan.    Medication Planned:  midazolam intravenously, fentanyl intravenously    Patient is an appropriate candidate for plan of sedation: yes      Electronically signed by Leodis Binet, MD on 03/31/2016 at 9:45 AM

## 2016-03-31 NOTE — Progress Notes (Signed)
Repeat ACT 132.

## 2016-04-01 LAB — EKG 12-LEAD
Atrial Rate: 58 {beats}/min
Atrial Rate: 59 {beats}/min
P Axis: 11 degrees
P Axis: 18 degrees
P-R Interval: 148 ms
P-R Interval: 150 ms
Q-T Interval: 458 ms
Q-T Interval: 482 ms
QRS Duration: 76 ms
QRS Duration: 76 ms
QTc Calculation (Bazett): 449 ms
QTc Calculation (Bazett): 477 ms
R Axis: -5 degrees
R Axis: 41 degrees
T Axis: 20 degrees
T Axis: 26 degrees
Ventricular Rate: 58 {beats}/min
Ventricular Rate: 59 {beats}/min

## 2016-04-01 LAB — CBC
Hematocrit: 39.9 % (ref 36.0–48.0)
Hemoglobin: 12.8 g/dL (ref 12.0–16.0)
MCH: 28.1 pg (ref 26.0–34.0)
MCHC: 32.1 g/dL (ref 31.0–36.0)
MCV: 87.4 fL (ref 80.0–100.0)
MPV: 9.6 fL (ref 5.0–10.5)
Platelets: 153 10*3/uL (ref 135–450)
RBC: 4.56 M/uL (ref 4.00–5.20)
RDW: 13.8 % (ref 12.4–15.4)
WBC: 6.2 10*3/uL (ref 4.0–11.0)

## 2016-04-01 LAB — BASIC METABOLIC PANEL
Anion Gap: 11 (ref 3–16)
BUN: 13 mg/dL (ref 7–20)
CO2: 28 mmol/L (ref 21–32)
Calcium: 8.8 mg/dL (ref 8.3–10.6)
Chloride: 103 mmol/L (ref 99–110)
Creatinine: 0.7 mg/dL (ref 0.6–1.1)
GFR African American: >60 (ref >60)
GFR Non-African American: >60 (ref >60)
Glucose: 106 mg/dL — ABNORMAL HIGH (ref 70–99)
Potassium: 3.7 mmol/L (ref 3.5–5.1)
Sodium: 142 mmol/L (ref 136–145)

## 2016-04-01 MED FILL — ONDANSETRON HCL 4 MG/2ML IJ SOLN: 4 MG/2ML | INTRAMUSCULAR | Qty: 2

## 2016-04-01 MED FILL — AGGRASTAT 5-0.9 MG/100ML-% IV SOLN: INTRAVENOUS | Qty: 100

## 2016-04-01 MED FILL — HYDRALAZINE HCL 25 MG PO TABS: 25 MG | ORAL | Qty: 1

## 2016-04-01 MED FILL — BUSPIRONE HCL 5 MG PO TABS: 5 MG | ORAL | Qty: 2

## 2016-04-01 MED FILL — EFFEXOR XR 150 MG PO CP24: 150 MG | ORAL | Qty: 1

## 2016-04-01 MED FILL — NORMAL SALINE FLUSH 0.9 % IV SOLN: 0.9 % | INTRAVENOUS | Qty: 10

## 2016-04-01 MED FILL — EFFIENT 10 MG PO TABS: 10 MG | ORAL | Qty: 1

## 2016-04-01 MED FILL — METOPROLOL TARTRATE 50 MG PO TABS: 50 MG | ORAL | Qty: 3

## 2016-04-01 MED FILL — FUROSEMIDE 10 MG/ML IJ SOLN: 10 MG/ML | INTRAMUSCULAR | Qty: 4

## 2016-04-01 MED FILL — PANTOPRAZOLE SODIUM 40 MG PO TBEC: 40 MG | ORAL | Qty: 1

## 2016-04-01 MED FILL — ALPRAZOLAM 0.25 MG PO TABS: 0.25 MG | ORAL | Qty: 1

## 2016-04-01 MED FILL — SINGULAIR 10 MG PO TABS: 10 MG | ORAL | Qty: 1

## 2016-04-01 MED FILL — ASPIRIN EC 81 MG PO TBEC: 81 MG | ORAL | Qty: 1

## 2016-04-01 MED FILL — CETIRIZINE HCL 10 MG PO TABS: 10 MG | ORAL | Qty: 1

## 2016-04-01 MED FILL — KLOR-CON M20 20 MEQ PO TBCR: 20 MEQ | ORAL | Qty: 1

## 2016-04-01 MED FILL — LISINOPRIL 20 MG PO TABS: 20 MG | ORAL | Qty: 2

## 2016-04-01 MED FILL — LIPITOR 80 MG PO TABS: 80 MG | ORAL | Qty: 1

## 2016-04-01 MED FILL — TRAZODONE HCL 50 MG PO TABS: 50 MG | ORAL | Qty: 2

## 2016-04-01 MED FILL — ACETAMINOPHEN 325 MG PO TABS: 325 MG | ORAL | Qty: 2

## 2016-04-01 NOTE — Plan of Care (Signed)
Problem: FLUID AND ELECTROLYTE IMBALANCE  Goal: Fluid and electrolyte balance are achieved/maintained  Intervention: INSTRUCT PATIENT TO DRINK PRESCRIBED FLUID AMOUNT  Pt will maintain prescribed fluid amount

## 2016-04-01 NOTE — Progress Notes (Signed)
[place] Daily Progress Note      Admit Date:  03/31/2016    Subjective:  Ms. Atchley still notes some left-sided chest discomfort.  She denies leg swelling.  Breathing is comfortable.    Objective:   BP 128/61   Pulse 58   Temp 97.5 ??F (36.4 ??C) (Temporal)    Resp 18   Ht 5\' 6"  (1.676 m)   Wt (!) 308 lb 6.8 oz (139.9 kg)   SpO2 94%   BMI 49.78 kg/m2    Intake/Output Summary (Last 24 hours) at 04/01/16 1031  Last data filed at 03/31/16 1826   Gross per 24 hour   Intake                0 ml   Output             1000 ml   Net            -1000 ml       TELEMETRY: Sinus     Physical Exam:  General:  Awake, alert, oriented x 3, NAD  Skin:  Warm and dry  Neck:  JVD normal  Chest:  normal air entry  Cardiovascular:  RRR S1S2, no S3, no significant murmur  Abdomen:  Soft, ND, NT, No HSM  Extremities:  No edema  Musculoskeletal: + tenderness to palpation of chest    Medications:   ??? cetirizine  10 mg Oral Daily   ??? pantoprazole  40 mg Oral QAM AC   ??? venlafaxine  150 mg Oral Daily   ??? metoprolol  150 mg Oral BID   ??? hydrALAZINE  25 mg Oral TID   ??? traZODone  100 mg Oral Nightly   ??? lisinopril  40 mg Oral Daily   ??? montelukast  10 mg Oral Nightly   ??? busPIRone  10 mg Oral TID   ??? potassium chloride  20 mEq Oral Daily   ??? sodium chloride flush  10 mL Intravenous 2 times per day   ??? aspirin  81 mg Oral Daily   ??? prasugrel  10 mg Oral Daily   ??? atorvastatin  80 mg Oral Nightly   ??? furosemide  40 mg Intravenous BID        ondansetron, albuterol sulfate HFA, ALPRAZolam, traMADol, sodium chloride flush, acetaminophen, magnesium hydroxide, ondansetron, potassium chloride **OR** potassium chloride **OR** potassium chloride    Lab Data:  CBC: Recent Labs      03/31/16   0825  04/01/16   0645   WBC  6.8  6.2   HGB   --   12.8   HCT   --   39.9   MCV   --   87.4   PLT  152  153     BMP: Recent Labs      04/01/16   0645   NA  142   K  3.7   CL  103   CO2  28   BUN  13   CREATININE  0.7     LIVER PROFILE: No results for  input(s): AST, ALT, LIPASE, BILIDIR, BILITOT, ALKPHOS in the last 72 hours.    Invalid input(s):  AMYLASE,  ALB  PT/INR: No results for input(s): PROTIME, INR in the last 72 hours.  APTT: No results for input(s): APTT in the last 72 hours.  BNP:  No results for input(s): BNP in the last 72 hours.  IMAGING:     Cardiac Cath 03/31/16:  Anatomy:  LM-normal   LAD-70% mid with FFR 0.77  Cx-normal, codominant  OM1- normal  RCA-normal  RPDA- normal  LVEF- 70%, LVEDP 19   PCI: LAD 70% to 0% with 3.25 mm x 18 mm Xience Alpine drug-eluting stent     Hemodynamics:  RA-15/12 mean 9  RV- 60/0, 9  PAWP-20/26 mean 19  PA- 60/12 mean 33        Impression:  1.  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  2.  Hyperdynamic LV systolic function.  3.  Mildly decompensated diastolic CHF.  4.  Moderate pulmonary hypertension.     Plan:  1.  Aspirin indefinitely.  2.  Effient/equivalent for minimum of 1 year.  3.  IV diuresis while in the hospital and then discharged on Lasix 40 mg by mouth twice a day.  4.  Continue BiPAP therapy.  5.  Outpatient cardiac rehab.  6.  CHF service follow-up on discharge.    CTPA 03/25/16:  No evidence of pulmonary embolism or acute pulmonary abnormality.     Chest x-ray 03/25/16:  No acute process.     ECG 25-Mar-2016 17:28:21 [facility]:  Sinus bradycardia  Otherwise normal ECG  No significant change     CTPA 03/10/16:  No evidence of pulmonary embolus.   Small right and trace left pleural effusions, new as well as interlobular   septal wall thickening.  Findings likely related to edema.   Mild mediastinal adenopathy, new.  Findings may be reactive.  Recommend   follow-up to ensure resolution.            Chest x-ray 02/13/16:  FINDINGS:   Cardiomegaly.  There appears be pulmonary vascular congestion and   interstitial edema.  No focal airspace disease.                Impression   Findings suggest congestive heart failure           Echo 10/07/15:  Summary   Technically  limited study due to body habitus.   Normal left ventricle size and systolic function with an estimated ejection fraction of 55%. No regional wall motion abnormalities are seen.   There is mild concentric left ventricular hypertrophy.     Cardiac Cath 09/20/15:  Anatomy:   LM-normal  LAD-normal  Cx-normal  OM1- normal  RCA-normal, codominant  RPDA- normal  LVEF- 60%      Impression:  1. Normal coronaries  2. Normal LV systolic function  3. False positive stress test        Myoview Stress Test  09/18/15:  Summary    -Moderate sized anterior mostly reversible defect consistent with ischemia    in the territory of the mid LAD .    -Normal LV function.    -Study is limited by breast attenuation.      Cardiac cath 06/03/12:  Normal coronaries and LVEF       Assessment:  Patient Active Problem List    Diagnosis Date Noted   ??? Coronary artery disease due to lipid rich plaque 03/31/2016     Priority: High   ??? Pulmonary hypertension ([place]) 03/31/2016     Priority: High   ??? Unstable angina ([place]) 03/31/2016     Priority: High   ??? Status post insertion of drug-eluting stent into left anterior descending (LAD) artery 03/31/2016     Priority: High   ??? Shortness of breath 03/30/2016     Priority: High   ??? Acute diastolic  congestive heart failure ([place]) 03/30/2016     Priority: High   ??? Abnormal stress test 09/19/2015     Priority: Low   ??? Obstructive sleep apnea (adult) (pediatric) 09/18/2015     Priority: Low   ??? Dyspnea and respiratory abnormalities      Priority: Low   ??? Adrenal nodule (HCC) 02/26/2015     Priority: Low   ??? Atypical chest pain 01/12/2015     Priority: Low   ??? 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 Adrenal mass, left ([place]) 01/12/2015     Priority: Low   ??? Essential hypertension 07/24/2013     Priority: Low   ??? Hyperglycemia 07/24/2013     Priority: Low   ??? Other complications of gastric band procedure 07/05/2013     Priority: Low   ??? Vertigo 04/11/2013     Priority: Low   ??? Dizziness  03/29/2013     Priority: Low   ??? Tinnitus, subjective 03/29/2013     Priority: Low   ??? Status post gastric banding 03/26/2010     Priority: Low   ??? Morbid obesity ([place]) 02/21/2009     Priority: Low   ??? Back pain 01/23/2009     Priority: Low   ??? Deep vein thrombosis ([place]) 01/23/2009     Priority: Low   ??? Depression 01/23/2009     Priority: Low   ??? Hyperlipidemia 01/23/2009     Priority: Low   ??? Pulmonary embolism ([place]) 01/23/2009     Priority: Low   ??? Nausea & vomiting 01/23/2009     Priority: Low   ??? Abdominal  pain, other specified site 01/23/2009     Priority: Low       Admitted with unstable angina.  Cardiac cath 03/31/16 revealed significant medical LAD disease which was successfully revascularized with drug-eluting stent implantation.  She is also noted to have moderate pulmonary hypertension and decompensated diastolic CHF.  Left chest discomfort has tenderness to palpation suggesting musculoskeletal etiology.    Plan:  1. Continue IV diuresis today.  2. Increase activity.  3. Anticipate home tomorrow.    Core Measures:  ?? Discharge instructions:   ?? LVEF documented:   ?? ACEI for LV dysfunction:   ?? Smoking Cessation:    Leodis Binet, MD 04/01/2016 10:31 AM

## 2016-04-01 NOTE — Progress Notes (Signed)
[place]. Coord. Returned call.  States she will speak with Dr. Susy Manor.

## 2016-04-01 NOTE — Plan of Care (Signed)
Post procedure site check completed. Surgical site is within normal limits and appears to be free of complications. All concerns were reviewed and questions answered. Patient verbalized understanding.

## 2016-04-01 NOTE — Plan of Care (Signed)
Problem: Tissue Perfusion - Peripheral, Altered:  Goal: Absence of hematoma at arterial access site  Absence of hematoma at arterial access site   Outcome: Ongoing

## 2016-04-01 NOTE — Plan of Care (Signed)
Problem: Pain:  Intervention: Promote participation in pain management plan  Pt alert and oriented. Pt able to communicate present pain and use the pain scale appropriately. Nonpharmacological pain reducers and pain medication offered as needed. Will cont to monitor.

## 2016-04-01 NOTE — Plan of Care (Signed)
Problem: OXYGENATION/RESPIRATORY FUNCTION  Goal: Patient will achieve/maintain normal respiratory rate/effort  Respiratory rate and effort will be within normal limits for the patient  Outcome: Met This Shift  resp unlabored , sat's 93 % on room air , tolerated hospital Bipap machine well overnight. Spoke to pt about need for continued use of BIPAP  Mask @ home (she has her own ) discussed issues with pulmonary  hypertension & CHF .continue to monitor .    Problem: FLUID AND ELECTROLYTE IMBALANCE  Goal: Fluid and electrolyte balance are achieved/maintained  Outcome: Met This Shift  Pt  Talked about how  She likes to drink lots of fluids , discussed with her about fluid restriction in regards to CHF, (8 oz glasses x 8 or 64 fluid oz /day), to weight daily & report excess wt gain to MD ( 3 lbs/day or 5 lbs in a week )The current medical regimen is effective;  continue present plan and medications.  Continue to educate pt prior to discharge & answer all questions.

## 2016-04-01 NOTE — Plan of Care (Signed)
Problem: Falls - Risk of  Intervention: Fall precautions  Pt remains free of falls. Fall risk protocol in place. Bed locked in lowest position. Call light in reach. Pt instructed to call for assistance, verbalizes understanding. Will continue to monitor.

## 2016-04-01 NOTE — Progress Notes (Signed)
Pt is not ready to be placed on Bipap at this time

## 2016-04-01 NOTE — Progress Notes (Signed)
Pt resting in bed.  Complaining of steady sharp left chest pain radiating to back. pain 8/10.  Pain doesn't change with inspiration, expiration or change in position.  VSS.  NSR.  PT requesting pain med and States ultram and tylenol doesn't help.  Left message with Cardiology coordinator with information.

## 2016-04-01 NOTE — Progress Notes (Signed)
CHF education:  Discussed Activity, meds, s/s Heart Failure, Diet, and Daily WT with pt.  Questions answered.  Pt stated she understood CHF information given to her.

## 2016-04-01 NOTE — Progress Notes (Signed)
Pt is not ready for Bipap at this time

## 2016-04-01 NOTE — Other (Signed)
Introduced self to patient. Initial shift assessment completed, see complex assessment in doc flowsheet. Questions answered. Call light within reach. Bed in low position with wheels locked. Encouraged to call for nurse as needed throughout the shift. No c/o pain at this time. But c/o nausea - gave Zofran IV.

## 2016-04-01 NOTE — Progress Notes (Signed)
Initiated Cardiac Rehab phase I teaching and reviewed Cardiac Rehab Phase II and risk factors with patient and family. Left brochure at bedside.

## 2016-04-01 NOTE — Progress Notes (Signed)
Am labs sent . VSS sat 92 % RA . Am weight down to 139.3kg (141.2). EKG done , pt up to void  Then prefers to go back to bed for awhile , bipap mask replaced.

## 2016-04-01 NOTE — Plan of Care (Signed)
Problem: Pain:  Goal: Control of acute pain  Control of acute pain   Outcome: Ongoing  No c/o pain at this time.

## 2016-04-01 NOTE — Care Coordination-Inpatient (Signed)
Discharge planning-    Spoke with the patient re: discharge planning needs. Patient reports that she lives at home with her family. Patient identifies no discharge planning needs at this time. Patient reports that she uses Dr. Murrell Converse as her PCP in the community. Patient reports that she is able to afford all of her prescriptions, she identifies no financial concerns. Patient identifies no other needs at this time.    Plan- Home with family.    Will continue to follow for support and discharge planning.    -Gershon Mussel, MSW, LSW

## 2016-04-02 ENCOUNTER — Encounter: Attending: Adult Health | Primary: Sports Medicine

## 2016-04-02 ENCOUNTER — Inpatient Hospital Stay
Admit: 2016-04-02 | Discharge: 2016-04-02 | Disposition: A | Discharge status: Home / Self Care | Payer: PRIVATE HEALTH INSURANCE | Source: Ambulatory Visit | Attending: Cardiovascular Disease | Admitting: Cardiovascular Disease | Primary: Sports Medicine

## 2016-04-02 MED ORDER — ATORVASTATIN CALCIUM 80 MG PO TABS
80 MG | ORAL_TABLET | Freq: Every day | ORAL | 3 refills | Status: DC
Start: 2016-04-02 — End: 2016-04-30

## 2016-04-02 MED ORDER — ATORVASTATIN CALCIUM 80 MG PO TABS
80 MG | ORAL_TABLET | Freq: Every day | ORAL | 3 refills | Status: DC
Start: 2016-04-02 — End: 2016-04-02

## 2016-04-02 MED ORDER — PRASUGREL HCL 10 MG PO TABS
10 MG | ORAL_TABLET | Freq: Every day | ORAL | 0 refills | Status: DC
Start: 2016-04-02 — End: 2016-04-02

## 2016-04-02 MED ORDER — PRASUGREL HCL 10 MG PO TABS
10 MG | ORAL_TABLET | Freq: Every day | ORAL | 3 refills | Status: DC
Start: 2016-04-02 — End: 2017-02-16

## 2016-04-02 MED ORDER — ASPIRIN 81 MG PO TBEC
81 MG | ORAL_TABLET | Freq: Every day | ORAL | 11 refills | Status: DC
Start: 2016-04-02 — End: 2017-02-16

## 2016-04-02 MED ORDER — ATORVASTATIN CALCIUM 80 MG PO TABS
80 MG | ORAL_TABLET | Freq: Every day | ORAL | 0 refills | Status: DC
Start: 2016-04-02 — End: 2016-04-02

## 2016-04-02 MED ORDER — FUROSEMIDE 40 MG PO TABS
40 MG | ORAL_TABLET | Freq: Every day | ORAL | 3 refills | Status: DC
Start: 2016-04-02 — End: 2016-04-09

## 2016-04-02 MED FILL — PANTOPRAZOLE SODIUM 40 MG PO TBEC: 40 MG | ORAL | Qty: 1

## 2016-04-02 MED FILL — KLOR-CON M20 20 MEQ PO TBCR: 20 MEQ | ORAL | Qty: 1

## 2016-04-02 MED FILL — CETIRIZINE HCL 10 MG PO TABS: 10 MG | ORAL | Qty: 1

## 2016-04-02 MED FILL — ACETAMINOPHEN 325 MG PO TABS: 325 MG | ORAL | Qty: 2

## 2016-04-02 MED FILL — ASPIRIN EC 81 MG PO TBEC: 81 MG | ORAL | Qty: 1

## 2016-04-02 MED FILL — HYDRALAZINE HCL 25 MG PO TABS: 25 MG | ORAL | Qty: 1

## 2016-04-02 MED FILL — FUROSEMIDE 10 MG/ML IJ SOLN: 10 MG/ML | INTRAMUSCULAR | Qty: 4

## 2016-04-02 MED FILL — BUSPIRONE HCL 5 MG PO TABS: 5 MG | ORAL | Qty: 2

## 2016-04-02 MED FILL — LISINOPRIL 20 MG PO TABS: 20 MG | ORAL | Qty: 2

## 2016-04-02 MED FILL — METOPROLOL TARTRATE 50 MG PO TABS: 50 MG | ORAL | Qty: 3

## 2016-04-02 MED FILL — EFFIENT 10 MG PO TABS: 10 MG | ORAL | Qty: 1

## 2016-04-02 NOTE — Progress Notes (Signed)
Took pt off cpap. Vss. 54/16/97% on ra

## 2016-04-02 NOTE — Discharge Summary (Signed)
[place]    DISCHARGE SUMMARY      Patient ID:  Rachael Mays  [id number] y.o. 11-11-1956    Admit date: 03/31/2016    Discharge date:  04/02/16    Admitting Physician: Leodis Binet, MD     Discharge Physician: Leodis Binet     Admission Diagnoses: SOB, CP    Discharge Diagnoses:   Patient Active Problem List   Diagnosis   ??? Back pain   ??? Deep vein thrombosis ([place])   ??? Depression   ??? Hyperlipidemia   ??? Pulmonary embolism ([place])   ??? Nausea & vomiting   ??? Abdominal  pain, other specified site   ??? Morbid obesity ([place])   ??? Status post gastric banding   ??? Vertigo   ??? Dizziness   ??? Tinnitus, subjective   ??? Other complications of gastric band procedure   ??? Essential hypertension   ??? Hyperglycemia   ??? Atypical chest pain   ??? Celiac artery aneurysm ([place])   ??? Adrenal mass, left ([place])   ??? Adrenal nodule ([place])   ??? Obstructive sleep apnea (adult) (pediatric)   ??? Dyspnea and respiratory abnormalities   ??? Abnormal stress test   ??? Shortness of breath   ??? Acute diastolic congestive heart failure ([place])   ??? Coronary artery disease due to lipid rich plaque   ??? Pulmonary hypertension ([place])   ??? Unstable angina ([place])   ??? Status post insertion of drug-eluting stent into left anterior descending (LAD) artery        Discharged Condition: good    Hospital Course: Rachael Mays was admitted with shortness of breath and chest discomfort.  She underwent cardiac cath revealing moderate pulmonary hypertension, diastolic CHF, significant 1 vessel CAD.  She underwent PCI with drug-eluting stent implantation in the mid LAD.  She was diuresed with IV Lasix while in the hospital.  On discharge she did have continued left upper back discomfort, not of cardiac origin.  She was recommended to follow-up with her pain management specialist regarding.    Consults:  IP CONSULT TO CARDIAC REHAB    Significant Diagnostic Studies:   Echocardiography: not done  Stress test:  not done    Cardiac Cath 03/31/16:  Anatomy:   LM-normal   LAD-70%  mid with FFR 0.77  Cx-normal, codominant  OM1- normal  RCA-normal  RPDA- normal  LVEF- 70%, LVEDP 19   PCI: LAD 70% to 0% with 3.25 mm x 18 mm Xience Alpine drug-eluting stent     Hemodynamics:  RA-15/12 mean 9  RV- 60/0, 9  PAWP-20/26 mean 19  PA- 60/12 mean 33        Impression:  1.  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  2.  Hyperdynamic LV systolic function.  3.  Mildly decompensated diastolic CHF.  4.  Moderate pulmonary hypertension.     Plan:  1.  Aspirin indefinitely.  2.  Effient/equivalent for minimum of 1 year.  3.  IV diuresis while in the hospital and then discharged on Lasix 40 mg by mouth twice a day.  4.  Continue BiPAP therapy.  5.  Outpatient cardiac rehab.  6.  CHF service follow-up on discharge.      Labs:   Lab Results   Component Value Date    CREATININE 0.7 04/01/2016    BUN 13 04/01/2016    NA 142 04/01/2016    K 3.7 04/01/2016  CL 103 04/01/2016    CO2 28 04/01/2016      Lab Results   Component Value Date    WBC 6.2 04/01/2016    HGB 12.8 04/01/2016    HCT 39.9 04/01/2016    MCV 87.4 04/01/2016    PLT 153 04/01/2016      Lab Results   Component Value Date    INR 1.03 03/25/2016    PROTIME 11.6 03/25/2016    No results found for: BNP    Disposition: home    Patient Instructions:   Current Discharge Medication List      START taking these medications    Details   aspirin 81 MG EC tablet Take 1 tablet by mouth daily  Qty: 30 tablet, Refills: 11      prasugrel (EFFIENT) 10 MG TABS Take 1 tablet by mouth daily  Qty: 90 tablet, Refills: 3      atorvastatin (LIPITOR) 80 MG tablet Take 1 tablet by mouth daily  Qty: 90 tablet, Refills: 3         CONTINUE these medications which have CHANGED    Details   furosemide (LASIX) 40 MG tablet Take 1 tablet by mouth daily  Qty: 90 tablet, Refills: 3         CONTINUE these medications which have NOT CHANGED    Details   traMADol (ULTRAM) 50 MG tablet Take 100 mg by mouth every 8 hours as needed for Pain       potassium chloride (KLOR-CON M) 20 MEQ extended release tablet Take 1 tablet by mouth daily  Qty: 30 tablet, Refills: 5      ALPRAZolam (XANAX) 0.25 MG tablet Take 1 tablet by mouth 2 times daily as needed for Anxiety  Qty: 60 tablet, Refills: 0    Associated Diagnoses: Anxiety      busPIRone (BUSPAR) 10 MG tablet Take 1 tablet by mouth 3 times daily  Qty: 90 tablet, Refills: 3    Associated Diagnoses: Anxiety      montelukast (SINGULAIR) 10 MG tablet TAKE ONE TABLET BY MOUTH ONE TIME A DAY  Qty: 90 tablet, Refills: 1      lisinopril (PRINIVIL;ZESTRIL) 40 MG tablet Take 40 mg by mouth daily      traZODone (DESYREL) 100 MG tablet TAKE 1 TABLET BY MOUTH NIGHTLY  Qty: 90 tablet, Refills: 1    Associated Diagnoses: Insomnia, unspecified type      albuterol sulfate HFA (PROVENTIL HFA) 108 (90 BASE) MCG/ACT inhaler Inhale 2 puffs into the lungs every 6 hours as needed for Wheezing  Qty: 3 Inhaler, Refills: 3      hydrALAZINE (APRESOLINE) 25 MG tablet Take 1 tablet by mouth 3 times daily  Qty: 270 tablet, Refills: 3    Associated Diagnoses: Essential hypertension      metoprolol (LOPRESSOR) 100 MG tablet Take 1.5 tablets by mouth 2 times daily  Qty: 270 tablet, Refills: 0      ondansetron (ZOFRAN) 4 MG tablet Take 1 tablet by mouth daily as needed for Nausea or Vomiting  Qty: 30 tablet, Refills: 1      venlafaxine (EFFEXOR XR) 150 MG XR capsule Take 1 capsule by mouth daily  Qty: 90 capsule, Refills: 3    Associated Diagnoses: Depression, unspecified depression type      Multiple Vitamins-Minerals (MULTIVITAMIN PO) Take by mouth      CALCIUM-VITAMIN D PO Take by mouth      omeprazole (PRILOSEC) 40 MG capsule Take 1 capsule by mouth  daily.  Qty: 30 capsule, Refills: 3      cetirizine (ZYRTEC ALLERGY) 10 MG tablet Take 10 mg by mouth daily.      triamcinolone (KENALOG) 0.1 % cream Apply to affected areas twice daily for up to 2 weeks or until improved.  Qty: 80 g, Refills: 1      Blood Pressure Monitor KIT Use daily as  needed  Qty: 1 kit, Refills: 0             is on a beta-blocker  is on an ace-i/ARB  is on a statin  is on antiplatelet therapy (aspirin and Effient)  is not on anticoagulant     Follow-up with [facility] in 2 weeks.    Signed:  Leodis Binet, 04/02/2016, 8:51 AM

## 2016-04-02 NOTE — Plan of Care (Signed)
Problem: Falls - Risk of  Intervention: Fall precautions  Pt remains free of falls. Fall risk protocol in place. Bed locked in lowest position. Call light in reach. Pt instructed to call for assistance, verbalizes understanding. Will continue to monitor.

## 2016-04-02 NOTE — Progress Notes (Addendum)
Discharge instructions reviewed with patient and family member.  Patient and family verbalized understanding.  All home medications have been reviewed, questions answered and patient voiced understanding. All medication side effects reviewed and patient and family verbalized understanding. Patient given prescriptions, discharge instructions, and appointment times.  Patient discharged to home with family via private car.  Taken to lobby via wheelchair.      Follow up appointment(s) reviewed with patient and all attempts made to schedule within 7 days of discharge.      [facility]  Depression Screen    1. During the past month, have you often been bothered by feeling down, depressed, or hopeless?   no  2.  During the past month, have you often been bothered by little interest or pleasure in       doing things?   no

## 2016-04-02 NOTE — Progress Notes (Signed)
Pt resting sitting at side of bed. VSS. No complaints of sob or pain.

## 2016-04-02 NOTE — Other (Addendum)
Patient Acct Nbr:  [id number]  Primary AUTH/CERT:    [place] Name:   [facility] MED  Primary Insurance Plan Name:  [facility] EMP  Primary Insurance Group Number:  [id number]  Primary Insurance Plan Type: N  Primary Insurance Policy Number:  [id number]

## 2016-04-03 NOTE — Care Coordination-Inpatient (Signed)
Care Transition [facility] Follow Up Call    Date:  04/03/2016   Patient: [name]  DOB: 05/22/1956  MRN: <[id number]>    Date of discharge:  04/02/16  [facility]  Non-face-to-face services provided:  Obtained and reviewed discharge summary and/or continuity of care documents    [name], patient    1. How have you been feeling since you left the hospital/experiencing any symptoms? States she had some chest pains last night with some dyspnea and pain in her arms.  She states she does not have pain today except for her back which MD did not think was cardiac related  Any intervention needed? no     2. Do you have a copy of your discharge instructions? yes     3. What questions do you have about these instructions? See Below    4. Were you able to get all of your new prescriptions filled? yes        5. Has a Sports administrator called?  no If No, please ask below:    May we review the medications you've been taking since you got home?        yes     Would you tell me how you are taking each medication and what you are taking it for?     yes    Current Outpatient Prescriptions   Medication Sig Dispense Refill   ??? aspirin 81 MG EC tablet Take 1 tablet by mouth daily 30 tablet 11   ??? furosemide (LASIX) 40 MG tablet Take 1 tablet by mouth daily 90 tablet 3   ??? prasugrel (EFFIENT) 10 MG TABS Take 1 tablet by mouth daily 90 tablet 3   ??? atorvastatin (LIPITOR) 80 MG tablet Take 1 tablet by mouth daily 90 tablet 3   ??? traMADol (ULTRAM) 50 MG tablet Take 100 mg by mouth every 8 hours as needed for Pain     ??? potassium chloride (KLOR-CON M) 20 MEQ extended release tablet Take 1 tablet by mouth daily 30 tablet 5   ??? triamcinolone (KENALOG) 0.1 % cream Apply to affected areas twice daily for up to 2 weeks or until improved. 80 g 1   ??? ALPRAZolam (XANAX) 0.25 MG tablet Take 1 tablet by mouth 2 times daily as needed for Anxiety 60 tablet 0   ??? busPIRone (BUSPAR) 10 MG tablet Take 1 tablet by  mouth 3 times daily 90 tablet 3   ??? Blood Pressure KIT 1 each by Does not apply route once for 1 dose 1 kit 0   ??? montelukast (SINGULAIR) 10 MG tablet TAKE ONE TABLET BY MOUTH ONE TIME A DAY 90 tablet 1   ??? lisinopril (PRINIVIL;ZESTRIL) 40 MG tablet Take 40 mg by mouth daily     ??? traZODone (DESYREL) 100 MG tablet TAKE 1 TABLET BY MOUTH NIGHTLY 90 tablet 1   ??? albuterol sulfate HFA (PROVENTIL HFA) 108 (90 BASE) MCG/ACT inhaler Inhale 2 puffs into the lungs every 6 hours as needed for Wheezing 3 Inhaler 3   ??? Blood Pressure Monitor KIT Use daily as needed 1 kit 0   ??? hydrALAZINE (APRESOLINE) 25 MG tablet Take 1 tablet by mouth 3 times daily 270 tablet 3   ??? metoprolol (LOPRESSOR) 100 MG tablet Take 1.5 tablets by mouth 2 times daily 270 tablet 0   ??? ondansetron (ZOFRAN) 4 MG tablet Take 1 tablet by mouth daily as needed for Nausea or Vomiting 30  tablet 1   ??? venlafaxine (EFFEXOR XR) 150 MG XR capsule Take 1 capsule by mouth daily 90 capsule 3   ??? Multiple Vitamins-Minerals (MULTIVITAMIN PO) Take by mouth     ??? CALCIUM-VITAMIN D PO Take by mouth     ??? omeprazole (PRILOSEC) 40 MG capsule Take 1 capsule by mouth daily. 30 capsule 3   ??? cetirizine (ZYRTEC ALLERGY) 10 MG tablet Take 10 mg by mouth daily.       No current facility-administered medications for this visit.      6. Have you scheduled your follow up appointment? No, was encouraged to schedule with PCP within 1 week.  She has a cardiology appointment on Monday      How are you going to get there? car    7. Has someone from home health been in contact with you?  NA    8. Who else is helping you at home? Spouse  Does anyone in your home need your help? No    9. Do you feel like you have everything you need to keep you well at home? Yes  DME? none    Contact information provided to patient and encouraged them to call me with any needs/concerns.    Follow up appointments: Future Appointments  Date Time Provider [place]   04/07/2016 11:15 AM Luiz Iron, NP [facility]   04/21/2016 10:30 AM Dorian Furnace, CNP [facility]   05/05/2016 3:30 PM Tiffanie Rita Ohara, CNP [facility]   06/25/2016 11:30 AM Francisca December, MD [facility]     Spoke with patient, reconciled medications.  Patient does not have Lisinopril at home and states this was discontinued by her doctor quite some time back.  She was advised to speak with cardiologist about this medication.  Review of chart shows that it was not Lisinopril that was discontinued but rather Amlodipine.  Patient was called back and explained that Lisinopril was never stopped.  Patient has not been taking for 5 months now.  She reports her last bp was around 114/41.  She was advised to call PCP office and give current blood pressure reading and ask what she should do about this medication.  She was also advised to mention this to her cardiologist on Tuesday.  Today she has not symptoms but was advised to go to ED for chest pain or worsening cardiac symptoms as she had last evening.    Lamonte Sakai, RN

## 2016-04-03 NOTE — Telephone Encounter (Signed)
[place]  TELEPHONE ENCOUNTER FORM    Rachael Mays 1956/02/23    ASSESSMENT:   1. Baseline weight:302 lb  2. Current weight: Patient has not yet weighed self today-  3. Symptoms:Patient denying any symptoms, a little bit "tired"  4. Diet history: following a low sodium diet Yes  5. Medication history: taking medications as instructed Yes; medication side effects noted No  6. Tobacco history: never  7. Activity history: normal activities of daily living  8. Have you made a lifestyle change since being home? Yes-- patient is watching sodium intake at home    RECOMMENDATIONS:   1. Medication: patient has not concerns, was able to get filled and is taking  2. Diet: following no added salt diet  3. MD/ Clinic appointment: 04/07/16 with Jenell Milliner CNP  4. Other: Patient doing well. She has not yet weighed herself. Had been weighing daily prior to admission. Reinforced knowing when to call and to follow up with cardiology next week.           Tai Syfert 04/03/2016 2:43 PM

## 2016-04-04 ENCOUNTER — Emergency Department: Admit: 2016-04-05 | Primary: Sports Medicine

## 2016-04-04 NOTE — ED Provider Notes (Signed)
[place] ED PROVIDER NOTE    Patient Identification  Pt Name: Rachael Mays  MRN: [id number]  [place] February 20, 1956  Date of evaluation: 04/04/2016  Provider: Roxan Diesel, MD  PCP: Acquanetta Sit SLOUGH, MD    Chief Complaint  Chest Pain (L sided chest pain and SOB that started around 1500 today, getting worse. had a stent in Tuesday )      HPI  (History provided by patient)  This is a 60 y.o. female who was brought in by self for chest pain shortness of breath.  This started around 3:00 today.  It has been steadily worsening or since.  Chest pain is localized to her left side and is described as a dull, aching pain. She has a history of coronary artery disease. She just had a cath done this week with stents placed by Dr. Susy Manor. Her chest pain is similar to previous symptoms. She has not had swelling, abdominal pain, vomiting, or fever. Her pain is severe.    ROS  10 systems reviewed, pertinent positives/negatives per HPI otherwise noted to be negative.    I have reviewed the following nursing documentation:  Allergies: Morphine and Talwin [pentazocine]    Past medical history:   Past Medical History:   Diagnosis Date   ??? Anxiety    ??? Asthma    ??? Chronic back pain    ??? Deep vein thrombosis    ??? Depression    ??? GERD (gastroesophageal reflux disease)     NO LONGER SINCE LAP   ??? GERD (gastroesophageal reflux disease) 01/23/2009   ??? Hx of blood clots    ??? Hyperlipidemia     hx; resolved with lap band   ??? Hypertension    ??? Obesity     hx of; had lap band   ??? Obstructive sleep apnea (adult) (pediatric) 09/18/2015   ??? Pulmonary embolism ([place])      Past surgical history:   Past Surgical History:   Procedure Laterality Date   ??? BACK SURGERY      neck  plates   ??? CESAREAN SECTION     ??? CHOLECYSTECTOMY     ??? COLONOSCOPY     ??? COLONOSCOPY  04/08/10   ??? HYSTERECTOMY     ??? LAP BAND  05/01/08    Dr. Nicole Cella   ??? OTHER SURGICAL HISTORY      [place] Filter: curently in place   ??? OTHER SURGICAL HISTORY  07/05/13     lap band removal   ??? PARTIAL HYSTERECTOMY     ??? SHOULDER SURGERY     ??? UPPER GASTROINTESTINAL ENDOSCOPY  05/19/13   ??? UPPER GASTROINTESTINAL ENDOSCOPY  07/21/13    ESOPHAGEAL STENT PLACEMENT   ??? UPPER GASTROINTESTINAL ENDOSCOPY N/A 07/31/2014    Esophagogastroduodenoscopy with esophageal balloon dilation   ??? VASCULAR SURGERY  04/2015    Lucendia Herrlich, celiac artery angiogram, normal abd arteries       Home medications:   Previous Medications    ALBUTEROL SULFATE HFA (PROVENTIL HFA) 108 (90 BASE) MCG/ACT INHALER    Inhale 2 puffs into the lungs every 6 hours as needed for Wheezing    ALPRAZOLAM (XANAX) 0.25 MG TABLET    Take 1 tablet by mouth 2 times daily as needed for Anxiety    ASPIRIN 81 MG EC TABLET    Take 1 tablet by mouth daily    ATORVASTATIN (LIPITOR) 80 MG TABLET    Take 1 tablet by mouth daily  BLOOD PRESSURE KIT    1 each by Does not apply route once for 1 dose    BLOOD PRESSURE MONITOR KIT    Use daily as needed    BUSPIRONE (BUSPAR) 10 MG TABLET    Take 1 tablet by mouth 3 times daily    CALCIUM-VITAMIN D PO    Take by mouth    CETIRIZINE (ZYRTEC ALLERGY) 10 MG TABLET    Take 10 mg by mouth daily.    FUROSEMIDE (LASIX) 40 MG TABLET    Take 1 tablet by mouth daily    HYDRALAZINE (APRESOLINE) 25 MG TABLET    Take 1 tablet by mouth 3 times daily    LISINOPRIL (PRINIVIL;ZESTRIL) 40 MG TABLET    Take 40 mg by mouth daily Indications: Patient thought this was dc'd in December by PCP     METOPROLOL (LOPRESSOR) 100 MG TABLET    Take 1.5 tablets by mouth 2 times daily    MONTELUKAST (SINGULAIR) 10 MG TABLET    TAKE ONE TABLET BY MOUTH ONE TIME A DAY    MULTIPLE VITAMINS-MINERALS (MULTIVITAMIN PO)    Take by mouth    OMEPRAZOLE (PRILOSEC) 40 MG CAPSULE    Take 1 capsule by mouth daily.    ONDANSETRON (ZOFRAN) 4 MG TABLET    Take 1 tablet by mouth daily as needed for Nausea or Vomiting    POTASSIUM CHLORIDE (KLOR-CON M) 20 MEQ EXTENDED RELEASE TABLET    Take 1 tablet by mouth daily    PRASUGREL (EFFIENT) 10 MG TABS     Take 1 tablet by mouth daily    TRAMADOL (ULTRAM) 50 MG TABLET    Take 100 mg by mouth every 8 hours as needed for Pain    TRAZODONE (DESYREL) 100 MG TABLET    TAKE 1 TABLET BY MOUTH NIGHTLY    TRIAMCINOLONE (KENALOG) 0.1 % CREAM    Apply to affected areas twice daily for up to 2 weeks or until improved.    VENLAFAXINE (EFFEXOR XR) 150 MG XR CAPSULE    Take 1 capsule by mouth daily       Social history:  reports that she quit smoking about 3 years ago. Her smoking use included Cigarettes. She has a 20.00 pack-year smoking history. She has never used smokeless tobacco. She reports that she drinks alcohol. She reports that she does not use illicit drugs.    Family history:    Family History   Problem Relation Age of Onset   ??? Arthritis Mother    ??? Cancer Mother    ??? Depression Mother    ??? High Blood Pressure Mother    ??? Heart Disease Mother 17     MI    ??? Ovarian Cancer Mother 62   ??? Cancer Father 4     colon   ??? Heart Disease Maternal Grandmother    ??? Early Death Paternal Grandmother    ??? Heart Disease Paternal Grandfather    ??? Diabetes Other    ??? High Blood Pressure Other    ??? Obesity Other    ??? Other Sister      OSA   ??? Other Brother      OSA   ??? Other Brother      OSA   ??? Other Daughter      OSA       Exam  ED Triage Vitals   BP Temp Temp Source Pulse Resp SpO2 Height Weight   04/04/16 2337 04/04/16 2337  04/04/16 2337 04/04/16 2337 04/04/16 2337 04/04/16 2337 04/04/16 2337 04/04/16 2337   168/65 98.7 ??F (37.1 ??C) Oral 68 18 97 % '5\' 6"'  (1.676 m) 298 lb (135.2 kg)     Nursing note and vitals reviewed.  Constitutional: Well developed, well nourished. Non-toxic in appearance.  HENT:      Head: Normocephalic and atraumatic.      Ears: External ears normal.      Nose: Nose normal.     Mouth: Membrane mucosa moist and pink.  Eyes: Anicteric sclera. No discharge.   Neck: Supple. Trachea midline.   Cardiovascular: RRR; no murmurs, rubs, or gallops.  Pulmonary/Chest: Effort normal. No respiratory distress. CTAB. No  stridor. No wheezes. No rales.   Abdominal: Soft. No distension. Non-tender to palpation.   Musculoskeletal: Moves all extremities. No gross deformity. No lower extremity edema.   Neurological: Alert and oriented to person, place, and time.    Skin: Warm and dry. No rash.  Psychiatric: Normal mood and affect. Behavior is normal.    Procedures    EKG  The Ekg interpreted by me in the absence of a cardiologist shows.  normal sinus rhythm with a rate of 64  Axis is   Left axis deviation  QTc is  normal  Intervals and Durations are unremarkable.      No specific ST-T wave changes appreciated.  No evidence of acute ischemia.   No significant change from prior EKG dated 03/31/2016      Radiology  XR Chest Standard TWO VW   Final Result   Stable borderline cardiomegaly.  No acute disease.             Labs  Results for orders placed or performed during the hospital encounter of 05/30/75   Basic Metabolic Panel   Result Value Ref Range    Sodium 139 136 - 145 mmol/L    Potassium 4.2 3.5 - 5.1 mmol/L    Chloride 101 99 - 110 mmol/L    CO2 25 21 - 32 mmol/L    Anion Gap 13 3 - 16    Glucose 143 (H) 70 - 99 mg/dL    BUN 16 7 - 20 mg/dL    CREATININE 1.0 0.6 - 1.1 mg/dL    GFR Non-African American 57 (A) >60    GFR African American >60 >60    Calcium 9.1 8.3 - 10.6 mg/dL   Troponin   Result Value Ref Range    Troponin <0.01 <0.01 ng/mL   CBC auto differential   Result Value Ref Range    WBC 8.1 4.0 - 11.0 K/uL    RBC 4.83 4.00 - 5.20 M/uL    Hemoglobin 13.7 12.0 - 16.0 g/dL    Hematocrit 41.8 36.0 - 48.0 %    MCV 86.6 80.0 - 100.0 fL    MCH 28.4 26.0 - 34.0 pg    MCHC 32.8 31.0 - 36.0 g/dL    RDW 14.3 12.4 - 15.4 %    Platelets 184 135 - 450 K/uL    MPV 9.7 5.0 - 10.5 fL    Neutrophils % 52.5 %    Lymphocytes % 33.2 %    Monocytes % 8.3 %    Eosinophils % 4.8 %    Basophils % 1.2 %    Neutrophils # 4.3 1.7 - 7.7 K/uL    Lymphocytes # 2.7 1.0 - 5.1 K/uL    Monocytes # 0.7 0.0 - 1.3 K/uL    Eosinophils # 0.4  0.0 - 0.6 K/uL     Basophils # 0.1 0.0 - 0.2 K/uL   APTT   Result Value Ref Range    aPTT 26.7 21.0 - 31.8 sec   Protime-INR   Result Value Ref Range    Protime 10.9 9.6 - 13.0 sec    INR 0.96 0.85 - 1.15   Troponin   Result Value Ref Range    Troponin <0.01 <0.01 ng/mL       Cath Report  Impression:  1. 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  2. Hyperdynamic LV systolic function.  3. Mildly decompensated diastolic CHF.  4. Moderate pulmonary hypertension.  ??  Plan:  1. Aspirin indefinitely.  2. Effient/equivalent for minimum of 1 year.  3. IV diuresis while in the hospital and then discharged on Lasix 40 mg by mouth twice a day.  4. Continue BiPAP therapy.  5. Outpatient cardiac rehab.  6. CHF service follow-up on discharge.  ??     MDM and ED Course  I reviewed the patient's records and reevaluated her multiple times.  I consulted with Cardiology and spoke to Dr. Olen Pel. We discussed the case. She recommended admission with further monitoring.    I spoke with Dr. Posey Pronto. We thoroughly discussed the history, physical exam, laboratory and imaging studies, as well as, emergency department course. Based upon that discussion, we've decided to admit Rachael Mays for further observation and evaluation of Rachael Mays's chest pain.  As I have deemed necessary from their history, physical, and studies, I have considered and evaluated Rachael Mays for the following diagnoses:  PULMONARY EMBOLISM, ACUTE CORONARY SYNDROME, CARDIAC TAMPONADE, PNEUMOTHORAX, PNEUMONIA, PERICARDITIS, AORTIC DISSECTION/ANEURYSM, HEMOTHORAX        Final Impression  1. Chest pain, unspecified type        Blood pressure (!) 145/73, pulse 63, temperature 96.7 ??F (35.9 ??C), temperature source Temporal, resp. rate 18, height '5\' 6"'  (1.676 m), weight (!) 310 lb 8 oz (140.8 kg), SpO2 94 %, not currently breastfeeding.     Disposition:  Admit        The total Critical Care time is 30 minutes which excludes separately  billable procedures.      This chart was generated using the [place] dictation system. I created this record but it may contain dictation errors given the limitations of this technology.       Roxan Diesel, MD  04/07/16 [phone]

## 2016-04-05 ENCOUNTER — Inpatient Hospital Stay
Admit: 2016-04-05 | Discharge: 2016-04-07 | Payer: PRIVATE HEALTH INSURANCE | Source: Home / Self Care | Admitting: Emergency Medicine

## 2016-04-05 DIAGNOSIS — R079 Chest pain, unspecified: Secondary | ICD-10-CM

## 2016-04-05 LAB — CBC WITH AUTO DIFFERENTIAL
Basophils %: 1.2 %
Basophils Absolute: 0.1 10*3/uL (ref 0.0–0.2)
Eosinophils %: 4.8 %
Eosinophils Absolute: 0.4 10*3/uL (ref 0.0–0.6)
Hematocrit: 41.8 % (ref 36.0–48.0)
Hemoglobin: 13.7 g/dL (ref 12.0–16.0)
Lymphocytes %: 33.2 %
Lymphocytes Absolute: 2.7 10*3/uL (ref 1.0–5.1)
MCH: 28.4 pg (ref 26.0–34.0)
MCHC: 32.8 g/dL (ref 31.0–36.0)
MCV: 86.6 fL (ref 80.0–100.0)
MPV: 9.7 fL (ref 5.0–10.5)
Monocytes %: 8.3 %
Monocytes Absolute: 0.7 10*3/uL (ref 0.0–1.3)
Neutrophils %: 52.5 %
Neutrophils Absolute: 4.3 10*3/uL (ref 1.7–7.7)
Platelets: 184 10*3/uL (ref 135–450)
RBC: 4.83 M/uL (ref 4.00–5.20)
RDW: 14.3 % (ref 12.4–15.4)
WBC: 8.1 10*3/uL (ref 4.0–11.0)

## 2016-04-05 LAB — TROPONIN
Troponin: <0.01 ng/mL (ref <0.01)
Troponin: <0.01 ng/mL (ref <0.01)
Troponin: <0.01 ng/mL (ref <0.01)

## 2016-04-05 LAB — BASIC METABOLIC PANEL
Anion Gap: 13 (ref 3–16)
BUN: 16 mg/dL (ref 7–20)
CO2: 25 mmol/L (ref 21–32)
Calcium: 9.1 mg/dL (ref 8.3–10.6)
Chloride: 101 mmol/L (ref 99–110)
Creatinine: 1 mg/dL (ref 0.6–1.1)
GFR African American: >60 (ref >60)
GFR Non-African American: 57 — AB (ref >60)
Glucose: 143 mg/dL — ABNORMAL HIGH (ref 70–99)
Potassium: 4.2 mmol/L (ref 3.5–5.1)
Sodium: 139 mmol/L (ref 136–145)

## 2016-04-05 LAB — EKG 12-LEAD
Atrial Rate: 64 {beats}/min
Diagnosis: NORMAL
P Axis: 50 degrees
P-R Interval: 142 ms
Q-T Interval: 442 ms
QRS Duration: 76 ms
QTc Calculation (Bazett): 455 ms
R Axis: -1 degrees
T Axis: 32 degrees
Ventricular Rate: 64 {beats}/min

## 2016-04-05 LAB — PROTIME-INR
INR: 0.96 (ref 0.85–1.15)
Protime: 10.9 s (ref 9.6–13.0)

## 2016-04-05 LAB — APTT: aPTT: 26.7 s (ref 21.0–31.8)

## 2016-04-05 MED ORDER — ONDANSETRON HCL 4 MG/2ML IJ SOLN
4 MG/2ML | Freq: Once | INTRAMUSCULAR | Status: AC
Start: 2016-04-05 — End: 2016-04-05
  Administered 2016-04-05: 05:00:00 8 mg via INTRAVENOUS

## 2016-04-05 MED ORDER — VENLAFAXINE HCL ER 150 MG PO CP24
150 MG | Freq: Every day | ORAL | Status: DC
Start: 2016-04-05 — End: 2016-04-07
  Administered 2016-04-05 – 2016-04-07 (×3): 150 mg via ORAL

## 2016-04-05 MED ORDER — METOPROLOL TARTRATE 50 MG PO TABS
50 MG | Freq: Two times a day (BID) | ORAL | Status: DC
Start: 2016-04-05 — End: 2016-04-07
  Administered 2016-04-05 – 2016-04-06 (×3): 150 mg via ORAL

## 2016-04-05 MED ORDER — ONDANSETRON HCL 4 MG/2ML IJ SOLN
4 MG/2ML | Freq: Four times a day (QID) | INTRAMUSCULAR | Status: DC | PRN
Start: 2016-04-05 — End: 2016-04-07
  Administered 2016-04-07: 14:00:00 4 mg via INTRAVENOUS

## 2016-04-05 MED ORDER — NORMAL SALINE FLUSH 0.9 % IV SOLN
0.9 % | INTRAVENOUS | Status: DC | PRN
Start: 2016-04-05 — End: 2016-04-07

## 2016-04-05 MED ORDER — CETIRIZINE HCL 10 MG PO TABS
10 MG | Freq: Every day | ORAL | Status: DC
Start: 2016-04-05 — End: 2016-04-07
  Administered 2016-04-05 – 2016-04-07 (×3): 10 mg via ORAL

## 2016-04-05 MED ORDER — TRAZODONE HCL 50 MG PO TABS
50 MG | Freq: Every evening | ORAL | Status: DC
Start: 2016-04-05 — End: 2016-04-07
  Administered 2016-04-06 (×2): 100 mg via ORAL

## 2016-04-05 MED ORDER — ASPIRIN 81 MG PO TBEC
81 MG | Freq: Every day | ORAL | Status: DC
Start: 2016-04-05 — End: 2016-04-07
  Administered 2016-04-05 – 2016-04-07 (×3): 81 mg via ORAL

## 2016-04-05 MED ORDER — MONTELUKAST SODIUM 10 MG PO TABS
10 MG | Freq: Every evening | ORAL | Status: DC
Start: 2016-04-05 — End: 2016-04-07
  Administered 2016-04-06 (×2): 10 mg via ORAL

## 2016-04-05 MED ORDER — MAGNESIUM HYDROXIDE 400 MG/5ML PO SUSP
400 MG/5ML | Freq: Every day | ORAL | Status: DC | PRN
Start: 2016-04-05 — End: 2016-04-07

## 2016-04-05 MED ORDER — BUSPIRONE HCL 5 MG PO TABS
5 MG | Freq: Three times a day (TID) | ORAL | Status: DC
Start: 2016-04-05 — End: 2016-04-07
  Administered 2016-04-05 – 2016-04-07 (×8): 10 mg via ORAL

## 2016-04-05 MED ORDER — POTASSIUM CHLORIDE CRYS ER 20 MEQ PO TBCR
20 MEQ | Freq: Every day | ORAL | Status: DC
Start: 2016-04-05 — End: 2016-04-07
  Administered 2016-04-05 – 2016-04-07 (×3): 20 meq via ORAL

## 2016-04-05 MED ORDER — KETOROLAC TROMETHAMINE 15 MG/ML IJ SOLN
15 MG/ML | Freq: Once | INTRAMUSCULAR | Status: AC
Start: 2016-04-05 — End: 2016-04-05
  Administered 2016-04-05: 05:00:00 15 mg via INTRAVENOUS

## 2016-04-05 MED ORDER — FUROSEMIDE 40 MG PO TABS
40 MG | Freq: Two times a day (BID) | ORAL | Status: DC
Start: 2016-04-05 — End: 2016-04-07
  Administered 2016-04-05 – 2016-04-07 (×5): 40 mg via ORAL

## 2016-04-05 MED ORDER — ALPRAZOLAM 0.25 MG PO TABS
0.25 MG | Freq: Two times a day (BID) | ORAL | Status: DC | PRN
Start: 2016-04-05 — End: 2016-04-07
  Administered 2016-04-05 – 2016-04-07 (×4): 0.25 mg via ORAL

## 2016-04-05 MED ORDER — PRASUGREL HCL 10 MG PO TABS
10 MG | Freq: Every day | ORAL | Status: DC
Start: 2016-04-05 — End: 2016-04-07
  Administered 2016-04-05 – 2016-04-07 (×3): 10 mg via ORAL

## 2016-04-05 MED ORDER — HYDRALAZINE HCL 25 MG PO TABS
25 MG | Freq: Three times a day (TID) | ORAL | Status: DC
Start: 2016-04-05 — End: 2016-04-07
  Administered 2016-04-05 – 2016-04-07 (×8): 25 mg via ORAL

## 2016-04-05 MED ORDER — ACETAMINOPHEN 325 MG PO TABS
325 MG | ORAL | Status: DC | PRN
Start: 2016-04-05 — End: 2016-04-07

## 2016-04-05 MED ORDER — OXYCODONE-ACETAMINOPHEN 5-325 MG PO TABS
5-325 MG | ORAL | Status: DC | PRN
Start: 2016-04-05 — End: 2016-04-07
  Administered 2016-04-05 – 2016-04-07 (×9): 1 via ORAL

## 2016-04-05 MED ORDER — LISINOPRIL 20 MG PO TABS
20 MG | Freq: Every day | ORAL | Status: DC
Start: 2016-04-05 — End: 2016-04-07
  Administered 2016-04-05 – 2016-04-07 (×3): 40 mg via ORAL

## 2016-04-05 MED ORDER — ATORVASTATIN CALCIUM 80 MG PO TABS
80 MG | Freq: Every day | ORAL | Status: DC
Start: 2016-04-05 — End: 2016-04-07
  Administered 2016-04-05 – 2016-04-07 (×3): 80 mg via ORAL

## 2016-04-05 MED ORDER — NITROGLYCERIN 0.4 MG SL SUBL
0.4 MG | SUBLINGUAL | Status: DC | PRN
Start: 2016-04-05 — End: 2016-04-07

## 2016-04-05 MED ORDER — ALBUTEROL SULFATE HFA 108 (90 BASE) MCG/ACT IN AERS
108 (90 Base) MCG/ACT | Freq: Four times a day (QID) | RESPIRATORY_TRACT | Status: DC | PRN
Start: 2016-04-05 — End: 2016-04-07

## 2016-04-05 MED ORDER — NITROGLYCERIN 0.4 MG SL SUBL
0.4 MG | SUBLINGUAL | Status: DC | PRN
Start: 2016-04-05 — End: 2016-04-05
  Administered 2016-04-05 (×2): 0.4 mg via SUBLINGUAL

## 2016-04-05 MED ORDER — ENOXAPARIN SODIUM 40 MG/0.4ML SC SOLN
40 MG/0.4ML | Freq: Every day | SUBCUTANEOUS | Status: DC
Start: 2016-04-05 — End: 2016-04-07

## 2016-04-05 MED ORDER — HYDRALAZINE HCL 25 MG PO TABS
25 MG | Freq: Three times a day (TID) | ORAL | Status: DC
Start: 2016-04-05 — End: 2016-04-05

## 2016-04-05 MED ORDER — ALBUTEROL SULFATE HFA 108 (90 BASE) MCG/ACT IN AERS
108 (90 Base) MCG/ACT | Freq: Four times a day (QID) | RESPIRATORY_TRACT | Status: DC | PRN
Start: 2016-04-05 — End: 2016-04-05

## 2016-04-05 MED ORDER — NORMAL SALINE FLUSH 0.9 % IV SOLN
0.9 % | Freq: Two times a day (BID) | INTRAVENOUS | Status: DC
Start: 2016-04-05 — End: 2016-04-07
  Administered 2016-04-05 – 2016-04-07 (×5): 10 mL via INTRAVENOUS

## 2016-04-05 MED ORDER — PROMETHAZINE HCL 25 MG/ML IJ SOLN
25 MG/ML | Freq: Four times a day (QID) | INTRAMUSCULAR | Status: DC | PRN
Start: 2016-04-05 — End: 2016-04-07
  Administered 2016-04-05 – 2016-04-07 (×6): 12.5 mg via INTRAVENOUS

## 2016-04-05 MED ORDER — ASPIRIN 81 MG PO CHEW
81 MG | Freq: Once | ORAL | Status: AC
Start: 2016-04-05 — End: 2016-04-05
  Administered 2016-04-05: 05:00:00 324 mg via ORAL

## 2016-04-05 MED ORDER — PANTOPRAZOLE SODIUM 40 MG PO TBEC
40 MG | Freq: Every day | ORAL | Status: DC
Start: 2016-04-05 — End: 2016-04-07
  Administered 2016-04-05 – 2016-04-07 (×3): 40 mg via ORAL

## 2016-04-05 MED FILL — OXYCODONE-ACETAMINOPHEN 5-325 MG PO TABS: 5-325 MG | ORAL | Qty: 1

## 2016-04-05 MED FILL — BUSPIRONE HCL 5 MG PO TABS: 5 MG | ORAL | Qty: 2

## 2016-04-05 MED FILL — EFFEXOR XR 150 MG PO CP24: 150 MG | ORAL | Qty: 1

## 2016-04-05 MED FILL — ASPIRIN 81 MG PO CHEW: 81 MG | ORAL | Qty: 4

## 2016-04-05 MED FILL — NORMAL SALINE FLUSH 0.9 % IV SOLN: 0.9 % | INTRAVENOUS | Qty: 10

## 2016-04-05 MED FILL — FUROSEMIDE 40 MG PO TABS: 40 MG | ORAL | Qty: 1

## 2016-04-05 MED FILL — NITROSTAT 0.4 MG SL SUBL: 0.4 MG | SUBLINGUAL | Qty: 25

## 2016-04-05 MED FILL — ONDANSETRON HCL 4 MG/2ML IJ SOLN: 4 MG/2ML | INTRAMUSCULAR | Qty: 4

## 2016-04-05 MED FILL — PROAIR HFA 108 (90 BASE) MCG/ACT IN AERS: 108 (90 Base) MCG/ACT | RESPIRATORY_TRACT | Qty: 8.5

## 2016-04-05 MED FILL — KLOR-CON M20 20 MEQ PO TBCR: 20 MEQ | ORAL | Qty: 1

## 2016-04-05 MED FILL — PROMETHAZINE HCL 25 MG/ML IJ SOLN: 25 MG/ML | INTRAMUSCULAR | Qty: 1

## 2016-04-05 MED FILL — HYDRALAZINE HCL 25 MG PO TABS: 25 MG | ORAL | Qty: 1

## 2016-04-05 MED FILL — METOPROLOL TARTRATE 50 MG PO TABS: 50 MG | ORAL | Qty: 3

## 2016-04-05 MED FILL — EFFIENT 10 MG PO TABS: 10 MG | ORAL | Qty: 1

## 2016-04-05 MED FILL — LISINOPRIL 20 MG PO TABS: 20 MG | ORAL | Qty: 2

## 2016-04-05 MED FILL — ASPIRIN EC 81 MG PO TBEC: 81 MG | ORAL | Qty: 1

## 2016-04-05 MED FILL — ALPRAZOLAM 0.25 MG PO TABS: 0.25 MG | ORAL | Qty: 1

## 2016-04-05 MED FILL — LIPITOR 80 MG PO TABS: 80 MG | ORAL | Qty: 1

## 2016-04-05 MED FILL — CETIRIZINE HCL 10 MG PO TABS: 10 MG | ORAL | Qty: 1

## 2016-04-05 MED FILL — PANTOPRAZOLE SODIUM 40 MG PO TBEC: 40 MG | ORAL | Qty: 1

## 2016-04-05 MED FILL — KETOROLAC TROMETHAMINE 15 MG/ML IJ SOLN: 15 MG/ML | INTRAMUSCULAR | Qty: 1

## 2016-04-05 NOTE — Plan of Care (Signed)
Problem: Pain:  Goal: Pain level will decrease  Pain level will decrease   Outcome: Ongoing  Pt resting comfortably in bed at this time. Instructed pt to let this RN know if any pain occurs. Will continue to monitor.

## 2016-04-05 NOTE — Plan of Care (Signed)
Substernal chest pressure persists. Cards recs noted.   Echo to be done in am.   Dr Susy Manor to see her tomorrow

## 2016-04-05 NOTE — Consults (Addendum)
[place]   Cardiology Consultation   Date: 04/05/2016  Reason for Consultation: Chest pain   Consult Requesting Physician: Dimas Chyle, MD     Chief Complaint   Patient presents with   ??? Chest Pain     L sided chest pain and SOB that started around 1500 today, getting worse. had a stent in Tuesday      HPI: Rachael Mays is a 60 y.o. with history of CAD s/p recent cath and LAD stent, morbid obesity, OSA on CPAP, HTN who has presented to the hospital with chest pain. Pain is substernal, constant with no radiation. She states that she had chest pain when left hospital after PCI. Pain is similar. She is compliant with her medications. She has lost weight with lasix therapy. This morning she feels slightly better. No palpitation, syncope.     Past Medical History:   Diagnosis Date   ??? Anxiety    ??? Asthma    ??? Chronic back pain    ??? Deep vein thrombosis    ??? Depression    ??? GERD (gastroesophageal reflux disease)     NO LONGER SINCE LAP   ??? GERD (gastroesophageal reflux disease) 01/23/2009   ??? Hx of blood clots    ??? Hyperlipidemia     hx; resolved with lap band   ??? Hypertension    ??? Obesity     hx of; had lap band   ??? Obstructive sleep apnea (adult) (pediatric) 09/18/2015   ??? Pulmonary embolism [facility])         Past Surgical History:   Procedure Laterality Date   ??? BACK SURGERY      neck  plates   ??? CESAREAN SECTION     ??? CHOLECYSTECTOMY     ??? COLONOSCOPY     ??? COLONOSCOPY  04/08/10   ??? HYSTERECTOMY     ??? LAP BAND  05/01/08    Dr. Nicole Cella   ??? OTHER SURGICAL HISTORY      [place] Filter: curently in place   ??? OTHER SURGICAL HISTORY  07/05/13    lap band removal   ??? PARTIAL HYSTERECTOMY     ??? SHOULDER SURGERY     ??? UPPER GASTROINTESTINAL ENDOSCOPY  05/19/13   ??? UPPER GASTROINTESTINAL ENDOSCOPY  07/21/13    ESOPHAGEAL STENT PLACEMENT   ??? UPPER GASTROINTESTINAL ENDOSCOPY N/A 07/31/2014    Esophagogastroduodenoscopy with esophageal balloon dilation   ??? VASCULAR SURGERY  04/2015    Lucendia Herrlich, celiac artery angiogram,  normal abd arteries       Allergies   Allergen Reactions   ??? Morphine Itching   ??? Talwin [Pentazocine] Other (See Comments)     dizzy       Social History:  Reviewed.  reports that she quit smoking about 3 years ago. Her smoking use included Cigarettes. She has a 20.00 pack-year smoking history. She has never used smokeless tobacco. She reports that she drinks alcohol. She reports that she does not use illicit drugs.     Family History:  Reviewed. family history includes Arthritis in her mother; Cancer in her mother; Cancer (age of onset: 54) in her father; Depression in her mother; Diabetes in an other family member; Early Death in her paternal grandmother; Heart Disease in her maternal grandmother and paternal grandfather; Heart Disease (age of onset: 44) in her mother; High Blood Pressure in her mother and another family member; Obesity in an other family member; Other in her brother, brother, daughter, and  sister; Ovarian Cancer (age of onset: 85) in her mother.     Review of System:  All other systems reviewed Pertinent negatives and positives are:     ?? General: - fever, - chills  + weight loss   ?? Ophthalmic ROS: - eye pain or loss of vision  ?? ENT - headaches, - sore throat   ?? Respiratory: - cough, - sputum + DOE +shortness of breath   ?? Cardiovascular: Reviewed in HPI  ?? Gastrointestinal: - abdominal pain, - diarrhea, - N/V   ?? Hematology: - bleeding, - blood clots, - bruising - jaundice    ?? Genito-Urinary:  - Dysuria or incontinence   ?? Musculoskeletal: - Joint swelling, - muscle pain   ?? Neurological: - confusion, - dizziness, - headaches   ?? Psychiatric: - anxiety, - depression   ?? Dermatological: - rash       Physical Examination:  Vitals:    04/05/16 1000   BP:    Pulse: 59   Resp:    Temp:    SpO2:           Wt Readings from Last 3 Encounters:   04/05/16 (!) 305 lb 5.4 oz (138.5 kg)   04/02/16 (!) 302 lb 14.6 oz (137.4 kg)   03/30/16 (!) 306 lb 8 oz (139 kg)     BP  Min: 131/72  Max: 168/65  Temp   Avg: 98.1 ??F (36.7 ??C)  Min: 97.7 ??F (36.5 ??C)  Max: 98.7 ??F (37.1 ??C)  Pulse  Avg: 62.4  Min: 57  Max: 68  SpO2  Avg: 96.1 %  Min: 96 %  Max: 97 %  FiO2   Avg: 21 %  Min: 21 %  Max: 21 %  No intake or output data in the 24 hours ending 04/05/16 1057    ?? Telemetry:  Sinus rhythm   ?? Constitutional: No major distress.   ?? Head: Atraumatic.   ?? Mouth/Throat: Oropharynx is clear   ?? Eyes: Conjunctivae normal.   ?? Neck: Neck supple.   ?? Cardiovascular: Normal rate regular rhythm, S1&S2.    ?? Pulmonary/Chest: Bilateral respiratory sounds.   ?? Abdominal: soft. bowel sounds present, No tenderness ++  Obese  ?? Musculoskeletal: No edema    ?? Lymphadenopathy: No obvious adenopathy noted.   ?? Neurological: Alert and oriented. Moves all extremities, Follows command, No Gross deficit   ?? Skin: No rash noted.   ?? Psychiatric: Normal behavior       Labs:  Reviewed.   Recent Labs      04/04/16   2345   NA  139   K  4.2   CL  101   CO2  25   BUN  16   CREATININE  1.0     Recent Labs      04/04/16   2345   WBC  8.1   HGB  13.7   HCT  41.8   MCV  86.6   PLT  184     Lab Results   Component Value Date    CKTOTAL 189 01/11/2015    TROPONINI <0.01 04/05/2016     No results found for: BNP  Lab Results   Component Value Date    PROTIME 10.9 04/04/2016    PROTIME 11.6 03/25/2016    PROTIME 12.0 03/10/2016    INR 0.96 04/04/2016    INR 1.03 03/25/2016    INR 1.06 03/10/2016     Lab Results  Component Value Date    CHOL 208 05/04/2014    HDL 49 05/04/2014    HDL 52 03/31/2011    TRIG 137 05/04/2014       Diagnostic and imaging results reviewed.     ECG: Sinus rhythm   Echo:  Technically limited study due to body habitus.  ??  ??Normal left ventricle size and systolic function with an estimated  ??ejection fraction of 55%. No regional wall motion abnormalities are seen.  ??  ??There is mild concentric left ventricular hypertrophy.    Cath:   Cardiac Cath 03/31/16:  Anatomy:??  LM-normal??  LAD-70% mid with FFR 0.77  Cx-normal, codominant  OM1-  normal  RCA-normal  RPDA- normal  LVEF- 70%, LVEDP 19   PCI: LAD 70% to 0% with 3.25 mm x 18 mm Xience Alpine drug-eluting stent  ??  Hemodynamics:  RA-15/12 mean 9  RV- 60/0, 9  PAWP-20/26 mean??19  PA- 60/12 mean 33  ??  ??  Impression:  1. ??Severe 1 vessel CAD involving the LAD with successful drug-eluting stent implantation.  2. ??Hyperdynamic LV systolic function.  3. ??Mildly decompensated diastolic CHF.  4. ??Moderate pulmonary hypertension.  ??  Plan:  1. ??Aspirin indefinitely.  2. ??Effient/equivalent for minimum of 1 year.    Scheduled Meds:  ??? aspirin  81 mg Oral Daily   ??? atorvastatin  80 mg Oral Daily   ??? busPIRone  10 mg Oral TID   ??? cetirizine  10 mg Oral Daily   ??? furosemide  40 mg Oral BID   ??? lisinopril  40 mg Oral Daily   ??? metoprolol  150 mg Oral BID   ??? montelukast  10 mg Oral Nightly   ??? pantoprazole  40 mg Oral QAM AC   ??? potassium chloride  20 mEq Oral Daily   ??? prasugrel  10 mg Oral Daily   ??? traZODone  100 mg Oral Nightly   ??? venlafaxine  150 mg Oral Daily   ??? sodium chloride flush  10 mL Intravenous 2 times per day   ??? enoxaparin  40 mg Subcutaneous Daily   ??? hydrALAZINE  25 mg Oral TID     Continuous Infusions:   PRN Meds:.ALPRAZolam, sodium chloride flush, acetaminophen, magnesium hydroxide, ondansetron, nitroGLYCERIN, oxyCODONE-acetaminophen, albuterol sulfate HFA     Assessment and plan:   Patient Active Problem List    Diagnosis Date Noted   ??? Coronary artery disease due to lipid rich plaque 03/31/2016     Priority: High   ??? Pulmonary hypertension ([place]) 03/31/2016     Priority: High   ??? Unstable angina ([place]) 03/31/2016     Priority: High   ??? Status post insertion of drug-eluting stent into left anterior descending (LAD) artery 03/31/2016     Priority: High   ??? Shortness of breath 03/30/2016     Priority: High   ??? Acute diastolic congestive heart failure ([place]) 03/30/2016     Priority: High   ??? Chest pain 04/05/2016   ??? Abnormal stress test 09/19/2015   ??? Obstructive sleep apnea (adult)  (pediatric) 09/18/2015   ??? Dyspnea and respiratory abnormalities    ??? Adrenal nodule ([place]) 02/26/2015   ??? Atypical chest pain 01/12/2015   ??? 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 Adrenal mass, left ([place]) 01/12/2015   ??? Essential hypertension 07/24/2013   ??? Hyperglycemia 07/24/2013   ??? Other complications of gastric band procedure 07/05/2013   ??? Vertigo 04/11/2013   ??? Dizziness 03/29/2013   ???  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 Morbid obesity ([place]) 02/21/2009   ??? Back pain 01/23/2009   ??? Deep vein thrombosis ([place]) 01/23/2009   ??? Depression 01/23/2009   ??? Hyperlipidemia 01/23/2009   ??? Pulmonary embolism ([place]) 01/23/2009   ??? Nausea & vomiting 01/23/2009   ??? Abdominal  pain, other specified site 01/23/2009      Active Hospital Problems    Diagnosis Date Noted   ??? Chest pain [R07.9] 04/05/2016     - Chest pain:    - Atypical. Constant chest pain. She had chest pain when left the hospital.    - ECG with no changes and troponin negative.    - Continue aggressive medical therapy with ASA, BB, and ACE-i.    - Will have Dr. Susy Manor to follow up tomorrow.      - Echo to rule out pericardial effusion.      - CAD: s/p recent PCI   - on DAPT and statin.     - HTN: Controlled.     - Morbid obesity: Body mass index is 49.28 kg/(m^2).     - OSA on CPAP.       Thank you for allowing me to participate in the care of Rocco Pauls, MD, MPH  [facility]   Office: [phone]

## 2016-04-05 NOTE — Progress Notes (Signed)
Patients shift assessment completed, VSS; see flowsheets. Patient alert and oriented x4. Patient in bed awake with no acute distress noted. Patient updated on plan of care for today, all questions answered. Patient instructed to call out with any needs, verbalized understanding. Patient voices no needs at this time. Call light within reach, will continue to monitor.

## 2016-04-05 NOTE — Progress Notes (Signed)
Pt is on Vision CPAP of 14 at 21% for HS. Order is for BIPAP with no settings and last visit pt was on CPAP of 14

## 2016-04-05 NOTE — H&P (Signed)
[place] [facility]    04/05/2016 1:49 AM    PCP:  Acquanetta Sit SLOUGH, MD (Tel: [phone] )    Chief complaint:  Chest pain     History of Present Illness:  Rachael Mays is a 60 y.o. female with morbid obesity, GERD, CAD s/p PCI with DES to mid-LAD on 03/31/16, hyperlipidemia, diastolic CHF, PHTN, OSA on BiPAP who presented with sudden onset of chest pressure and then developed moderate to severe left sided chest pain with radiation to her left axilla at 3 pm today. She had associated nausea and dyspnea along with development of a mild throbbing frontal headache. Chest pain and pressure did not resolve with SL nitro, although at present she reports her chest pain and pressure has improved slightly. She still has some mild dyspnea and nausea. Her headache has worsened some after the SL nitro but remains frontal. Denies any fever, chills, diaphoresis, lightheadedness or dizziness, syncope, visual changes, cough or sputum production, back or abdominal pain or unchanged trace bilateral lower extremity edema. She reports that she has been taking her BP at home as instructed, however she can not recall the readings but reports that they have not been out of the parameters given to her to call her cardiologist. She reports compliance with her BP medications, statin, ASA and Effient along with her diuretics. Of note patient does have a remote history of PE in the 1980s and thought to be due to oral contraceptives. She then also had an acute DVT following knee surgery 'years ago'. Both times she was on anticoagulation for 3 months and it was stopped. She denies any pleuritic nature to her chest pain, right sided chest pain, calf pain or swelling. She is not tachycardiac or hypoxic in ER.   ??  Of note patient does have a remote  History obtained from patient and EMR.     REVIEW OF SYSTEMS:   Pertient positives and negatives per HPI, otherwise all systems reviewed and are negative.      Past Medical History:   has a past medical history of Anxiety; Asthma; Chronic back pain; Deep vein thrombosis; Depression; GERD (gastroesophageal reflux disease); GERD (gastroesophageal reflux disease); Hx of blood clots; Hyperlipidemia; Hypertension; Obesity; Obstructive sleep apnea (adult) (pediatric); and Pulmonary embolism ([place]).     Past Surgical History:   has a past surgical history that includes Colonoscopy; Cesarean section; Cholecystectomy; Hysterectomy; shoulder surgery; Lap Band (05/01/08); back surgery; Colonoscopy (04/08/10); other surgical history; Upper gastrointestinal endoscopy (05/19/13); other surgical history (07/05/13); Upper gastrointestinal endoscopy (07/21/13); partial hysterectomy (cervix not removed); Upper gastrointestinal endoscopy (N/A, 07/31/2014); and vascular surgery (04/2015).     Medications Prior to Admission:  Prior to Admission medications    Medication Sig Start Date End Date Taking? Authorizing Provider   aspirin 81 MG EC tablet Take 1 tablet by mouth daily 04/02/16  Yes Leodis Binet, MD   furosemide (LASIX) 40 MG tablet Take 1 tablet by mouth daily  Patient taking differently: Take 40 mg by mouth 2 times daily Indications: as directed by cardiology  04/02/16  Yes Leodis Binet, MD   prasugrel (EFFIENT) 10 MG TABS Take 1 tablet by mouth daily 04/02/16  Yes Leodis Binet, MD   atorvastatin (LIPITOR) 80 MG tablet Take 1 tablet by mouth daily 04/02/16  Yes Leodis Binet, MD   potassium chloride (KLOR-CON M) 20 MEQ extended release tablet Take 1 tablet by mouth daily 03/27/16  Yes Leodis Binet, MD  ALPRAZolam (XANAX) 0.25 MG tablet Take 1 tablet by mouth 2 times daily as needed for Anxiety 03/23/16  Yes Harvel Quale, MD   busPIRone (BUSPAR) 10 MG tablet Take 1 tablet by mouth 3 times daily 03/23/16  Yes Harvel Quale, MD   montelukast (SINGULAIR) 10 MG tablet TAKE ONE TABLET BY MOUTH ONE TIME A DAY 03/18/16  Yes Jamal Collin, MD   lisinopril  (PRINIVIL;ZESTRIL) 40 MG tablet Take 40 mg by mouth daily Indications: Patient thought this was dc'd in December by PCP    Yes Historical Provider, MD   traZODone (DESYREL) 100 MG tablet TAKE 1 TABLET BY MOUTH NIGHTLY 01/02/16  Yes Harvel Quale, MD   albuterol sulfate HFA (PROVENTIL HFA) 108 (90 BASE) MCG/ACT inhaler Inhale 2 puffs into the lungs every 6 hours as needed for Wheezing 12/27/15  Yes Harvel Quale, MD   hydrALAZINE (APRESOLINE) 25 MG tablet Take 1 tablet by mouth 3 times daily 11/12/15  Yes Harvel Quale, MD   metoprolol (LOPRESSOR) 100 MG tablet Take 1.5 tablets by mouth 2 times daily 09/26/15  Yes Harvel Quale, MD   ondansetron [facility]) 4 MG tablet Take 1 tablet by mouth daily as needed for Nausea or Vomiting 06/03/15  Yes Lucky Rathke Rayford Halsted, MD   venlafaxine (EFFEXOR XR) 150 MG XR capsule Take 1 capsule by mouth daily 06/03/15  Yes Harvel Quale, MD   Multiple Vitamins-Minerals (MULTIVITAMIN PO) Take by mouth   Yes Historical Provider, MD   CALCIUM-VITAMIN D PO Take by mouth   Yes Historical Provider, MD   omeprazole (PRILOSEC) 40 MG capsule Take 1 capsule by mouth daily. 07/21/13  Yes Norwood Levo, MD   cetirizine (ZYRTEC ALLERGY) 10 MG tablet Take 10 mg by mouth daily.   Yes Historical Provider, MD   traMADol (ULTRAM) 50 MG tablet Take 100 mg by mouth every 8 hours as needed for Pain    Historical Provider, MD   triamcinolone (KENALOG) 0.1 % cream Apply to affected areas twice daily for up to 2 weeks or until improved. 03/23/16   Francisca December, MD   Blood Pressure KIT 1 each by Does not apply route once for 1 dose 03/23/16 03/23/16  Harvel Quale, MD   Blood Pressure Monitor KIT Use daily as needed 12/20/15   Harvel Quale, MD       Allergies:  Allergies   Allergen Reactions   ??? Morphine Itching   ??? Talwin [Pentazocine] Other (See Comments)     dizzy        Social History:  The patient currently lives at home   Tobacco:   reports that she quit smoking about 3 years ago. Her smoking use included Cigarettes. She has a 20.00 pack-year smoking history. She has never used smokeless tobacco.  ETOH:  reports that she drinks alcohol.    Family History: Reviewed in detail and positive as follows:  family history includes Arthritis in her mother; Cancer in her mother; Cancer (age of onset: 68) in her father; Depression in her mother; Diabetes in an other family member; Early Death in her paternal grandmother; Heart Disease in her maternal grandmother and paternal grandfather; Heart Disease (age of onset: 49) in her mother; High Blood Pressure in her mother and another family member; Obesity in an other family member; Other in her brother, brother, daughter, and sister; Ovarian Cancer (age of onset: 11) in  her mother.     PHYSICAL EXAM:  BP (!) 155/67   Pulse 58   Temp 98.7 ??F (37.1 ??C) (Oral)    Resp (!) 4   Ht _0  (1.676 m)   Wt 298 lb (135.2 kg)   SpO2 96%   BMI 48.1 kg/m2  General appearance: No apparent distress appears stated age and cooperative.  HEENT Normal cephalic, atraumatic without obvious deformity.  Pupils equal, round, and reactive to light.  Extra ocular muscles intact.  Conjunctivae/corneas clear.  Neck: Supple, No jugular venous distention/bruits.  Trachea midline without thyromegaly or adenopathy with full range of motion.  Lungs: Clear to auscultation, bilaterally without Rales/Wheezes/Rhonchi with good respiratory effort.  Heart: Regular rate and rhythm with Normal S1/S2 without murmurs, rubs or gallops, point of maximum impulse non-displaced  Abdomen: Soft, non-tender or non-distended without rigidity or guarding and positive bowel sounds all four quadrants.  Extremities: No clubbing, cyanosis. Trace pedal and PT edema bilaterally.  Full range of motion without deformity.  Skin: Skin color, texture, turgor normal.  No rashes or lesions.  Neurologic: Alert and oriented X 3, neurovascularly intact with sensory/motor  intact upper extremities/lower extremities, bilaterally.  Cranial nerves: II-XII intact, grossly non-focal.  Mental status: Alert, oriented, thought content appropriate.  Capillary Refill: Acceptable  < 3 seconds  Peripheral Pulses: +3 Easily felt, not easily obliterated with pressure    CXR:  I have reviewed the CXR with the following interpretation: No acute infiltrates or vascular congestion. Stable borderline cardiomegaly.   EKG:  I have reviewed the EKG with the following interpretation: NSR with no acute ischemic changes. No changes from most recent EKG on file.     CBC   Recent Labs      04/04/16   2345   WBC  8.1   HGB  13.7   HCT  41.8   PLT  184      RENAL  Recent Labs      04/04/16   2345   NA  139   K  4.2   CL  101   CO2  25   BUN  16   CREATININE  1.0     COAG  Recent Labs      04/04/16   2340   INR  0.96     CARDIAC ENZYMES  Recent Labs      04/04/16   2345   TROPONINI  <0.01     ??  Cardiac Cath 03/31/16:  Anatomy:??  LM-normal??  LAD-70% mid with FFR 0.77  Cx-normal, codominant  OM1- normal  RCA-normal  RPDA- normal  LVEF- 70%, LVEDP 19   PCI: LAD 70% to 0% with 3.25 mm x 18 mm Xience Alpine drug-eluting stent    Hemodynamics:  RA-15/12 mean 9  RV- 60/0, 9  PAWP-20/26 mean??19  PA- 60/12 mean 33      Assessment/Plan:   ?? Chest pain in a patient with known CAD s/p PCI with DES to mid-LAD on 03/31/16. Initial troponin negative. EKG showed no acute ischemic changes. Patient reports compliance with Effient, ASA and her BP medications.  BP has been running a little high in ED though. Will admit to observation with cardiac monitoring, serial troponins, continue ASA daily, Effient, beta blocker, ace inhibitor, hydralazine and her statin. Cardiology was called from ER and will see patient in am, no indication for anticoagulation at present. Will remain NPO until seen by cardiology.   ?? Hypertension. Semi controlled in ED. Will resume home  antihypertensives and monitor. Am dose of hydralazine PO now to be given.    ?? Hyperlipidemia. Continue statin therapy.   ?? Diastolic heart failure. Appears compensated at present. Continue outpatient diuretics and management.   ?? OSA on BiPAP nocturnal therapy. BiPAP nightly ordered.   ?? Morbid obesity, BMI 48.2. Weight loss advised.         DVT Prophylaxis: Lovenox   Diet:  NPO  Code Status: Full    Admit as Observation. I anticipate hospitalization for less than two midnights based on the above assessment and plan.     Koleen Distance, NP    04/05/2016 1:49 AM     Addendum    Discussed with APRN and agree with plan of care. I did not see patient. Hospitalist in AM to assume care.     Rutherford Limerick, MD

## 2016-04-05 NOTE — Plan of Care (Signed)
Problem: Pain:  Goal: Pain level will decrease  Pain level will decrease  Outcome: Ongoing  Pt resting in bed comfortably at this time. No c/o pain at this time. Call light within reach, will continue to monitor.

## 2016-04-05 NOTE — ED Notes (Signed)
Pt given SL nitro x2 with no relief. Pt is c/o worsening headache. Third SL nitro held at this time.      Lyndal Rainbow, RN  04/05/16 [phone]

## 2016-04-05 NOTE — Progress Notes (Signed)
RESPIRATORY THERAPY ASSESSMENT    Rachael Mays   [id number]  1956/04/13      Recent Surgical Procedures:  NONE  Pulmonary History: OSA, Asthma  Home Respiratory Therapy:  Albuterol MDI Q6 PRN  Home Oxygen Therapy:  NONE  Current Respiratory Therapy:  Albuterol MDI Q6 PRN  Vitals:    04/05/16 0241   BP: (!) 159/77   Pulse: 60   Resp: 18   Temp: 97.9 ??F (36.6 ??C)   SpO2: 96%       MPVC: UTA  mL  Actual VC: UTA  mL  Sputum:  ,  ,      ASSESSMENT OF RESPIRATORY SEVERITY INDEX (RSI)    Patients with orders for inhalation medications, oxygen, or any therapeutic treatment modality will be placed on Respiratory Protocol.  They will be assessed with the first treatment and at least every 72 hours thereafter.  The following severity scale will be used to determine frequency of treatment intervention.    Respiratory History: Mild Exacerbation = 3    Recent Surgical History: None = 0    Level of Consciousness: Alert, Oriented, and Cooperative = 0    Level of Activity: Walking with assistance = 1    Respiratory Pattern: Increased; RR 21-30 = 1    Breath Sounds: Diminshed bilaterally and/or crackles = 2    Cough: Strong, spontaneous, non-productive = 0    SPO2 (COPD values may differ): Greater than or equal to 92% on room air = 0    Peak Flow (asthma only): not applicable = 0    RSI: 6-8 = Q4hr PRN (every four hours as needed) for dyspnea  PLAN    Goals: medication delivery    Patient educated on: Medications     Comment / Outcomes: Delivery of Medications to help aeration    Plan of Care: Albuterol MDI Q6 PRN    Is patient being placed on Home Treatment Regimen? Yes      Respiratory Protocol Guidelines    1. Assessment and treatment by Respiratory Therapy will be initiated for medication and therapeutic interventions upon initiation of aerosolized medication.  2. Physician will be contacted for respiratory rate (RR) greater than 35 breaths per minute. Therapy will be held for heart rate (HR) greater than 140 beats per  minute, pending direction from physician.  3. Bronchodilators will be administered via Metered Dose Inhaler (MDI) or Hydrofluoroalkane (HFA) with spacer when the following criteria are met:  a. Alert and cooperative     b. HR < 140 bpm  c. RR < 30 bpm                d. Can demonstrate a 2???3 second inspiratory hold  4. Bronchodilators will be administered via Hand Held Nebulizer [facility]) to patients when ANY of the following criteria are met  a. Incogizant or uncooperative          b. Patients treated with HHN at Home        c. Unable to demonstrate proper MDI or HFA technique     d. RR > 30 bpm   5. Bronchodilators will be delivered via Metered Dose Inhaler (MDI) or Hydrofluoroalkane (HFA) with spacer to intubated patients on mechanical ventilation.  6. Inhalation medication orders will be delivered and/or substituted as outlined below.    Aerosolized Medications Ordering and Administration Guidelines:    1. All Medications will be ordered by a physician, and their frequency and/or modality will be adjusted as defined by  the patients Respiratory Severity Index (RSI) score.  2. If the patient does not have documented COPD, consider discontinuing anticholinergics when RSI is less than 9.  3. If the bronchospasm worsens (increased RSI), then the bronchodilator frequency can be increased to a maximum of every 4 hours.  If greater than every 4 hours is required, the physician will be contacted.  4. If the bronchospasm improves, the frequency of the bronchodilator can be decreased, based on the patient's RSI, but not less than home treatment regimen frequency.  5. Bronchodilator(s) will be discontinued if patient has a RSI less than 9 and has received no scheduled or as needed treatment for 72  Hrs.    Patients Ordered on a Mucolytic Agent:    1. Must always be administered with a bronchodilator.    2. Discontinue if patient experiences worsened bronchospasm, or secretions have lessened to the point that the patient is able  to clear them with a cough.    Anti-inflammatory and Combination Medications:    1. If the patient lacks prior history of lung disease, is not using inhaled anti-inflammatory medication at home, and lacks wheezing by examination or by history for at least 24 hours, contact physician for possible discontinuation.      Pharmacy formulary interchanges for Respiratory medications can also be found by using the Smart Phrase .[email or web address] in any EPIC comment field.

## 2016-04-05 NOTE — Progress Notes (Signed)
Shift assessment complete. VSS. A/o x4. Pt updated on POC, no further questions at this time. Pt denies any further needs at this time. Call light within reach, will continue to monitor.

## 2016-04-05 NOTE — Progress Notes (Signed)
Pt admitted to room 319. Pt able to ambulate in room without assistance. Family at bedside. Admission assessment complete. VSS. Pt updated on POC, no further questions at this time. Pt denies any further needs at this time. Call light within reach, will continue to monitor.

## 2016-04-05 NOTE — Progress Notes (Signed)
Pt is not ready for Cpap at this time

## 2016-04-05 NOTE — Plan of Care (Signed)
Problem: Pain:  Goal: Control of acute pain  Control of acute pain   Pt reported 6/10 chest pain this am, medicated per orders, patient currently in bed resting with eyes closed, no s/s of pain, no s/s of acute distress. Will continue to monitor and assess for pain.

## 2016-04-06 ENCOUNTER — Encounter: Attending: Family Medicine | Primary: Sports Medicine

## 2016-04-06 LAB — BASIC METABOLIC PANEL
Anion Gap: 11 (ref 3–16)
BUN: 20 mg/dL (ref 7–20)
CO2: 27 mmol/L (ref 21–32)
Calcium: 8.7 mg/dL (ref 8.3–10.6)
Chloride: 103 mmol/L (ref 99–110)
Creatinine: 0.9 mg/dL (ref 0.6–1.1)
GFR African American: >60 (ref >60)
GFR Non-African American: >60 (ref >60)
Glucose: 135 mg/dL — ABNORMAL HIGH (ref 70–99)
Potassium: 4 mmol/L (ref 3.5–5.1)
Sodium: 141 mmol/L (ref 136–145)

## 2016-04-06 LAB — EJECTION FRACTION PERCENTAGE: Left Ventricular Ejection Fraction High Value: 65 %

## 2016-04-06 LAB — ECHOCARDIOGRAM COMPLETE 2D W DOPPLER W COLOR: Left Ventricular Ejection Fraction: 65

## 2016-04-06 LAB — MAGNESIUM: Magnesium: 2 mg/dL (ref 1.80–2.40)

## 2016-04-06 MED ORDER — NITROGLYCERIN 0.4 MG SL SUBL
0.4 MG | ORAL_TABLET | SUBLINGUAL | 3 refills | Status: DC
Start: 2016-04-06 — End: 2017-02-16

## 2016-04-06 MED FILL — HYDRALAZINE HCL 25 MG PO TABS: 25 MG | ORAL | Qty: 1

## 2016-04-06 MED FILL — LISINOPRIL 20 MG PO TABS: 20 MG | ORAL | Qty: 2

## 2016-04-06 MED FILL — METOPROLOL TARTRATE 50 MG PO TABS: 50 MG | ORAL | Qty: 3

## 2016-04-06 MED FILL — OXYCODONE-ACETAMINOPHEN 5-325 MG PO TABS: 5-325 MG | ORAL | Qty: 1

## 2016-04-06 MED FILL — KLOR-CON M20 20 MEQ PO TBCR: 20 MEQ | ORAL | Qty: 1

## 2016-04-06 MED FILL — PROMETHAZINE HCL 25 MG/ML IJ SOLN: 25 MG/ML | INTRAMUSCULAR | Qty: 1

## 2016-04-06 MED FILL — NORMAL SALINE FLUSH 0.9 % IV SOLN: 0.9 % | INTRAVENOUS | Qty: 10

## 2016-04-06 MED FILL — EFFIENT 10 MG PO TABS: 10 MG | ORAL | Qty: 1

## 2016-04-06 MED FILL — CETIRIZINE HCL 10 MG PO TABS: 10 MG | ORAL | Qty: 1

## 2016-04-06 MED FILL — LIPITOR 80 MG PO TABS: 80 MG | ORAL | Qty: 1

## 2016-04-06 MED FILL — BUSPIRONE HCL 5 MG PO TABS: 5 MG | ORAL | Qty: 2

## 2016-04-06 MED FILL — TRAZODONE HCL 50 MG PO TABS: 50 MG | ORAL | Qty: 2

## 2016-04-06 MED FILL — SINGULAIR 10 MG PO TABS: 10 MG | ORAL | Qty: 1

## 2016-04-06 MED FILL — PANTOPRAZOLE SODIUM 40 MG PO TBEC: 40 MG | ORAL | Qty: 1

## 2016-04-06 MED FILL — EFFEXOR XR 150 MG PO CP24: 150 MG | ORAL | Qty: 1

## 2016-04-06 MED FILL — FUROSEMIDE 40 MG PO TABS: 40 MG | ORAL | Qty: 1

## 2016-04-06 MED FILL — ALPRAZOLAM 0.25 MG PO TABS: 0.25 MG | ORAL | Qty: 1

## 2016-04-06 MED FILL — ASPIRIN EC 81 MG PO TBEC: 81 MG | ORAL | Qty: 1

## 2016-04-06 NOTE — Plan of Care (Signed)
Problem: Pain:  Goal: Control of acute pain  Control of acute pain   Outcome: Ongoing  Patient c/o dull chest pain. Percocet given for pain management- relieved pain somewhat per patient. NSR/SB on the monitor. Awaiting cardiology consult. Troponin's negative. Will monitor.

## 2016-04-06 NOTE — Progress Notes (Signed)
[place] Daily Progress Note       Admit Date:  04/05/2016    Subjective:  [name] still complains of chest discomfort.  Left-sided, constant.  Has been present and unrelieved for weeks.  She had a similar discomfort prior to PCI, during PCI and after PCI.  She does have significant thoracic degenerative disc disease which has led to referral and management to pain management.  As part of this back disease it is believed that the bilateral lower rib pain that she has also experienced is related to it.  She did state that she had a recent mother in sensation as well.    Objective:   BP (!) 135/54   Pulse 58   Temp 97 ??F (36.1 ??C) (Temporal)    Resp 18   Ht 5\' 6"  (1.676 m)   Wt (!) 310 lb 3 oz (140.7 kg)   SpO2 96%   BMI 50.07 kg/m2    Intake/Output Summary (Last 24 hours) at 04/06/16 1803  Last data filed at 04/06/16 1507   Gross per 24 hour   Intake              720 ml   Output                0 ml   Net              720 ml       TELEMETRY: Sinus     Physical Exam:  General:  Awake, alert, oriented x 3, NAD  Skin:  Warm and dry  Neck:  JVD 4-5 cm  Chest:  normal air entry  Cardiovascular:  RRR S1S2, no S3, no significant murmur  Abdomen:  Soft, ND, NT, No HSM  Extremities:  No edema    Medications:   ??? aspirin  81 mg Oral Daily   ??? atorvastatin  80 mg Oral Daily   ??? busPIRone  10 mg Oral TID   ??? cetirizine  10 mg Oral Daily   ??? furosemide  40 mg Oral BID   ??? lisinopril  40 mg Oral Daily   ??? metoprolol  150 mg Oral BID   ??? montelukast  10 mg Oral Nightly   ??? pantoprazole  40 mg Oral QAM AC   ??? potassium chloride  20 mEq Oral Daily   ??? prasugrel  10 mg Oral Daily   ??? traZODone  100 mg Oral Nightly   ??? venlafaxine  150 mg Oral Daily   ??? sodium chloride flush  10 mL Intravenous 2 times per day   ??? enoxaparin  40 mg Subcutaneous Daily   ??? hydrALAZINE  25 mg Oral TID        ALPRAZolam, sodium chloride flush, acetaminophen, magnesium hydroxide, ondansetron, nitroGLYCERIN, oxyCODONE-acetaminophen, albuterol  sulfate HFA, promethazine    Lab Data:  CBC: Recent Labs      04/04/16   2345   WBC  8.1   HGB  13.7   HCT  41.8   MCV  86.6   PLT  184     BMP: Recent Labs      04/04/16   2345  04/06/16   0459   NA  139  141   K  4.2  4.0   CL  101  103   CO2  25  27   BUN  16  20   CREATININE  1.0  0.9     LIVER PROFILE: No results for input(s):  AST, ALT, LIPASE, BILIDIR, BILITOT, ALKPHOS in the last 72 hours.    Invalid input(s):  AMYLASE,  ALB  PT/INR:   Recent Labs      04/04/16   2340   PROTIME  10.9   INR  0.96     APTT:   Recent Labs      04/04/16   2340   APTT  26.7     BNP:  No results for input(s): BNP in the last 72 hours.  IMAGING:     Cardiac Cath 03/31/16:  Anatomy:??  LM-normal??  LAD-70% mid with FFR 0.77  Cx-normal, codominant  OM1- normal  RCA-normal  RPDA- normal  LVEF- 70%, LVEDP 19   PCI: LAD 70% to 0% with 3.25 mm x 18 mm Xience Alpine drug-eluting stent  ??  Hemodynamics:  RA-15/12 mean 9  RV- 60/0, 9  PAWP-20/26 mean??19  PA- 60/12 mean 33  ??  ??  Impression:  1. ??Severe 1 vessel CAD involving the LAD with successful drug-eluting stent implantation.  2. ??Hyperdynamic LV systolic function.  3. ??Mildly decompensated diastolic CHF.  4. ??Moderate pulmonary hypertension.  ??  Plan:  1. ??Aspirin indefinitely.  2. ??Effient/equivalent for minimum of 1 year.  3. ??IV diuresis while in the hospital and then discharged on Lasix 40 mg by mouth twice a day.  4. ??Continue BiPAP therapy.  5. ??Outpatient cardiac rehab.  6. ??CHF service follow-up on discharge.    CTPA 03/25/16:  No evidence of pulmonary embolism or acute pulmonary abnormality.  ??  Chest x-ray 03/25/16:  No acute process.  ??  ECG 25-Mar-2016 17:28:21 [facility]:  Sinus bradycardia  Otherwise normal ECG  No significant change  ??  CTPA 03/10/16:  No evidence of pulmonary embolus.   Small right and trace left pleural effusions, new as well as interlobular   septal wall thickening. ??Findings likely related to edema.   Mild mediastinal adenopathy, new. ??Findings may be  reactive. ??Recommend   follow-up to ensure resolution.   ??  ??  ??  Chest x-ray 02/13/16:  FINDINGS:   Cardiomegaly. ??There appears be pulmonary vascular congestion and   interstitial edema. ??No focal airspace disease.   ??   ??   Impression   Findings suggest congestive heart failure   ??   ??  Echo 10/07/15:  Summary  ??Technically limited study due to body habitus.  ??Normal left ventricle size and systolic function with an estimated??ejection fraction of 55%. No regional wall motion abnormalities are seen.  ??There is mild concentric left ventricular hypertrophy.  ??  Cardiac Cath 09/20/15:  Anatomy:??  LM-normal  LAD-normal  Cx-normal  OM1- normal  RCA-normal, codominant  RPDA- normal  LVEF- 60%  ??  Impression:  1. Normal coronaries  2. Normal LV systolic function  3. False positive stress test  ??  ??  Myoview Stress Test ??09/18/15:  Summary   ??-Moderate sized anterior mostly reversible defect consistent with ischemia   ??in the territory of the mid LAD .   ??-Normal LV function.   ??-Study is limited by breast attenuation.   ??  Cardiac cath 06/03/12:  Normal coronaries and LVEF       Assessment:  Patient Active Problem List    Diagnosis Date Noted   ??? Coronary artery disease due to lipid rich plaque 03/31/2016     Priority: High   ??? Pulmonary hypertension ([place]) 03/31/2016     Priority: High   ??? Unstable angina ([place]) 03/31/2016  Priority: High   ??? Status post insertion of drug-eluting stent into left anterior descending (LAD) artery 03/31/2016     Priority: High   ??? Shortness of breath 03/30/2016     Priority: High   ??? Acute diastolic congestive heart failure ([place]) 03/30/2016     Priority: High   ??? Chest pain 04/05/2016     Priority: Low   ??? Abnormal stress test 09/19/2015     Priority: Low   ??? Obstructive sleep apnea (adult) (pediatric) 09/18/2015     Priority: Low   ??? Dyspnea and respiratory abnormalities      Priority: Low   ??? Adrenal nodule (HCC) 02/26/2015     Priority: Low   ??? Atypical chest pain 01/12/2015     Priority:  Low   ??? 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 Adrenal mass, left ([place]) 01/12/2015     Priority: Low   ??? Essential hypertension 07/24/2013     Priority: Low   ??? Hyperglycemia 07/24/2013     Priority: Low   ??? Other complications of gastric band procedure 07/05/2013     Priority: Low   ??? Vertigo 04/11/2013     Priority: Low   ??? Dizziness 03/29/2013     Priority: Low   ??? Tinnitus, subjective 03/29/2013     Priority: Low   ??? Status post gastric banding 03/26/2010     Priority: Low   ??? Morbid obesity due to excess calories ([place]) 02/21/2009     Priority: Low   ??? Back pain 01/23/2009     Priority: Low   ??? Deep vein thrombosis ([place]) 01/23/2009     Priority: Low   ??? Depression 01/23/2009     Priority: Low   ??? Hyperlipidemia 01/23/2009     Priority: Low   ??? Pulmonary embolism ([place]) 01/23/2009     Priority: Low   ??? Nausea & vomiting 01/23/2009     Priority: Low   ??? Abdominal  pain, other specified site 01/23/2009     Priority: Low       Admitted with continued chest pain which is atypical.  She was recently admitted and underwent cardiac catheterization identifying a single significant LAD stenosis which was revascularized with drug-eluting stent implantation.    Plan:  1. Chest pain is noncardiac.  Volume status appears to be euvolemic.  Recommend evaluation by pain management Dr. Diana Eves.    Core Measures:  ?? Discharge instructions:   ?? LVEF documented:   ?? ACEI for LV dysfunction:   ?? Smoking Cessation:    Leodis Binet, MD 04/06/2016 6:03 PM

## 2016-04-06 NOTE — Progress Notes (Addendum)
[facility] PROGRESS NOTE    04/06/2016 10:32 AM        Name: Rachael Mays .              Admitted: 04/05/2016  Primary Care Provider: Acquanetta Sit SLOUGH, MD (Tel: [phone])      Subjective:  .    No overnight event resting well  Denies any chest pain sob or weakness  States ready to go home.     Reviewed interval ancillary notes    Current Medications    ALPRAZolam (XANAX) tablet 0.25 mg BID PRN   aspirin EC tablet 81 mg Daily   atorvastatin (LIPITOR) tablet 80 mg Daily   busPIRone (BUSPAR) tablet 10 mg TID   cetirizine (ZYRTEC) tablet 10 mg Daily   furosemide (LASIX) tablet 40 mg BID   lisinopril (PRINIVIL;ZESTRIL) tablet 40 mg Daily   metoprolol tartrate (LOPRESSOR) tablet 150 mg BID   montelukast (SINGULAIR) tablet 10 mg Nightly   pantoprazole (PROTONIX) tablet 40 mg QAM AC   potassium chloride (KLOR-CON M) extended release tablet 20 mEq Daily   prasugrel (EFFIENT) tablet 10 mg Daily   traZODone (DESYREL) tablet 100 mg Nightly   venlafaxine (EFFEXOR XR) extended release capsule 150 mg Daily   sodium chloride flush 0.9 % injection 10 mL 2 times per day   sodium chloride flush 0.9 % injection 10 mL PRN   acetaminophen (TYLENOL) tablet 650 mg Q4H PRN   magnesium hydroxide (MILK OF MAGNESIA) 400 MG/5ML suspension 30 mL Daily PRN   ondansetron (ZOFRAN) injection 4 mg Q6H PRN   enoxaparin (LOVENOX) injection 40 mg Daily   nitroGLYCERIN (NITROSTAT) SL tablet 0.4 mg Q5 Min PRN   hydrALAZINE (APRESOLINE) tablet 25 mg TID   oxyCODONE-acetaminophen (PERCOCET) 5-325 MG per tablet 1 tablet Q4H PRN   albuterol sulfate HFA 108 (90 BASE) MCG/ACT inhaler 2 puff Q6H PRN   promethazine (PHENERGAN) injection 12.5 mg Q6H PRN       Objective:  BP 134/66   Pulse 51   Temp 96.8 ??F (36 ??C) (Temporal)    Resp 18   Ht 5\' 6"  (1.676 m)   Wt (!) 310 lb 3 oz (140.7 kg)   SpO2 92%   BMI 50.07 kg/m2    Intake/Output Summary (Last 24 hours) at 04/06/16  1032  Last data filed at 04/05/16 1947   Gross per 24 hour   Intake              240 ml   Output                0 ml   Net              240 ml    Wt Readings from Last 3 Encounters:   04/06/16 (!) 310 lb 3 oz (140.7 kg)   04/02/16 (!) 302 lb 14.6 oz (137.4 kg)   03/30/16 (!) 306 lb 8 oz (139 kg)       General appearance:  Appears comfortable  Eyes: Sclera clear. Pupils equal.  ENT: Moist oral mucosa. Trachea midline, no adenopathy.  Cardiovascular: Regular rhythm, normal S1, S2. No murmur. No edema in lower extremities  Respiratory: Not using accessory muscles. Good inspiratory effort. Clear to auscultation bilaterally, no wheeze or crackles.   GI: Abdomen soft, no tenderness, not distended, normal bowel sounds  Musculoskeletal: No cyanosis in digits, neck supple  Neurology: CN 2-12 grossly intact. No speech or motor deficits  Psych: Normal affect. Alert  and oriented in time, place and person  Skin: Warm, dry, normal turgor    Labs and Tests:  CBC:   Recent Labs      04/04/16   2345   WBC  8.1   HGB  13.7   PLT  184     BMP:  Recent Labs      04/04/16   2345  04/06/16   0459   NA  139  141   K  4.2  4.0   CL  101  103   CO2  25  27   BUN  16  20   CREATININE  1.0  0.9   GLUCOSE  143*  135*     Hepatic: No results for input(s): AST, ALT, ALB, BILITOT, ALKPHOS in the last 72 hours.      Problem List  Active Problems:    Chest pain       Assessment & Plan:   Chest pain  - Chest pain:   - Atypical. Constant chest pain  - ECG with no changes and troponin negative.   - Continue aggressive medical therapy with ASA, BB, and ACE-i.   - await  Dr. Susy Manor trecs .     Hyperlipidemia. Continue statin therapy.  Diastolic heart failure. Appears compensated at present. Continue outpatient diuretics and management.   OSA on BiPAP nocturnal therapy. BiPAP nightly ordered.   Morbid obesity, BMI 48.2. Weight loss advised.       Diet: DIET CARDIAC;  Code:Full Code  DVT PPX    Dispo home fully home today       Trena Platt, MD    04/06/2016 10:32 AM

## 2016-04-06 NOTE — Discharge Instructions (Signed)
Resume activity as tolerated.

## 2016-04-06 NOTE — Other (Addendum)
Patient Acct Nbr:  [id number]  Primary AUTH/CERT:    [place] Name:   [place] [facility]  Primary Insurance Plan Name:  [place] [facility] EMP  Primary Insurance Group Number:  [id number]  Primary Insurance Plan Type: N  Primary Insurance Policy Number:  [id number]

## 2016-04-06 NOTE — Discharge Instructions (Signed)

## 2016-04-06 NOTE — Progress Notes (Signed)
AM assessment complete- see doc flow sheet. Patient alert & oriented x 4. VSS, Afebrile. Patient still c/o dull chest pain 7/10. Patient updated on POC- v/u. No other needs voiced at this time. Call light within reach. Instructed to call if needs assistance. Will monitor.

## 2016-04-07 ENCOUNTER — Encounter: Attending: Adult Health | Primary: Sports Medicine

## 2016-04-07 LAB — EKG 12-LEAD
Atrial Rate: 56 {beats}/min
P Axis: 43 degrees
P-R Interval: 142 ms
Q-T Interval: 458 ms
QRS Duration: 76 ms
QTc Calculation (Bazett): 441 ms
R Axis: -6 degrees
T Axis: 30 degrees
Ventricular Rate: 56 {beats}/min

## 2016-04-07 MED ORDER — OXYCODONE-ACETAMINOPHEN 5-325 MG PO TABS
5-325 MG | ORAL_TABLET | Freq: Four times a day (QID) | ORAL | 0 refills | Status: DC | PRN
Start: 2016-04-07 — End: 2018-06-14

## 2016-04-07 MED FILL — LOVENOX 40 MG/0.4ML SC SOLN: 40 MG/0.4ML | SUBCUTANEOUS | Qty: 0.4

## 2016-04-07 MED FILL — PROMETHAZINE HCL 25 MG/ML IJ SOLN: 25 MG/ML | INTRAMUSCULAR | Qty: 1

## 2016-04-07 MED FILL — NORMAL SALINE FLUSH 0.9 % IV SOLN: 0.9 % | INTRAVENOUS | Qty: 10

## 2016-04-07 MED FILL — METOPROLOL TARTRATE 50 MG PO TABS: 50 MG | ORAL | Qty: 3

## 2016-04-07 MED FILL — HYDRALAZINE HCL 25 MG PO TABS: 25 MG | ORAL | Qty: 1

## 2016-04-07 MED FILL — EFFEXOR XR 150 MG PO CP24: 150 MG | ORAL | Qty: 1

## 2016-04-07 MED FILL — BUSPIRONE HCL 5 MG PO TABS: 5 MG | ORAL | Qty: 2

## 2016-04-07 MED FILL — PANTOPRAZOLE SODIUM 40 MG PO TBEC: 40 MG | ORAL | Qty: 1

## 2016-04-07 MED FILL — KLOR-CON M20 20 MEQ PO TBCR: 20 MEQ | ORAL | Qty: 1

## 2016-04-07 MED FILL — LIPITOR 80 MG PO TABS: 80 MG | ORAL | Qty: 1

## 2016-04-07 MED FILL — ASPIRIN EC 81 MG PO TBEC: 81 MG | ORAL | Qty: 1

## 2016-04-07 MED FILL — EFFIENT 10 MG PO TABS: 10 MG | ORAL | Qty: 1

## 2016-04-07 MED FILL — LISINOPRIL 20 MG PO TABS: 20 MG | ORAL | Qty: 2

## 2016-04-07 MED FILL — CETIRIZINE HCL 10 MG PO TABS: 10 MG | ORAL | Qty: 1

## 2016-04-07 MED FILL — OXYCODONE-ACETAMINOPHEN 5-325 MG PO TABS: 5-325 MG | ORAL | Qty: 1

## 2016-04-07 MED FILL — ALPRAZOLAM 0.25 MG PO TABS: 0.25 MG | ORAL | Qty: 1

## 2016-04-07 MED FILL — ONDANSETRON HCL 4 MG/2ML IJ SOLN: 4 MG/2ML | INTRAMUSCULAR | Qty: 2

## 2016-04-07 MED FILL — FUROSEMIDE 40 MG PO TABS: 40 MG | ORAL | Qty: 1

## 2016-04-07 NOTE — Discharge Summary (Signed)
[facility] DISCHARGE SUMMARY    Patient Demographics    Patient. Rachael Mays  Date of Birth. Oct 29, 1956  MRN. [id number]     Primary care provider. Acquanetta Sit SLOUGH, MD  (Tel: [phone])    Admit date: 04/05/2016    Discharge date (blank if same as Note Date):   Note Date: 04/07/2016     Reason for Hospitalization.   Chief Complaint   Patient presents with   ??? Chest Pain     L sided chest pain and SOB that started around 1500 today, getting worse. had a stent in Tuesday          Significant Findings.   Active Problems:    Chest pain           Problem-based Hospital Course.  Chest pain  - Chest pain:   - Atypical. Constant chest pain  - ECG with no changes and troponin negative.   - Continue aggressive medical therapy with ASA, BB, and ACE-i.   Stable at discharge .   ??  Hyperlipidemia. Continue statin therapy.  Diastolic heart failure. Appears compensated at present. Continue outpatient diuretics and management.   OSA on BiPAP nocturnal therapy. BiPAP nightly ordered.   Morbid obesity, BMI 48.2. Weight loss advised    Consults.  IP CONSULT TO CARDIOLOGY  IP CONSULT TO HOSPITALIST  IP CONSULT TO CARDIOLOGY  IP CONSULT TO PAIN MANAGEMENT    Physical examination on discharge day.   BP 112/71   Pulse 56   Temp 97.4 ??F (36.3 ??C) (Temporal)    Resp 18   Ht '5\' 6"'  (1.676 m)   Wt (!) 310 lb 8 oz (140.8 kg)   SpO2 96%   BMI 50.12 kg/m2  General appearance.  Alert. Looks comfortable.  HEENT. Sclera clear. Moist mucus membranes.  Cardiovascular. Regular rate and rhythm, normal S1, S2. No murmur.   Respiratory. Not using accessory muscles.Clear to auscultation bilaterally, no wheeze.  Gastrointestinal. Abdomen soft, non-tender, not distended, normal bowel sounds  Neurology. Facial symmetry. No speech deficits. Moving all extremities equally.  Extremities. No edema in lower extremities.  Skin. Warm, dry, normal  turgor    Medication instructions provided to patient at discharge.     Medication List      START taking these medications          nitroGLYCERIN 0.4 MG SL tablet   Commonly known as:  NITROSTAT   up to max of 3 total doses. If no relief after 1 dose, call 911.   Notes to Patient:  Use: Treat chest pain  Side effects: headache, flushing, dizziness    ** Take 1 tablet every 5 minutes for chest pain up to 3 times.  Call doctor right away.           CHANGE how you take these medications          Blood Pressure Monitor Kit   Use daily as needed   What changed:  Another medication with the same name was removed. Continue taking this medication, and follow the directions you see here.       furosemide 40 MG tablet   Commonly known as:  LASIX   Take 1 tablet by mouth daily   What changed:  when to take this   Notes to Patient:  Use: treat heart failure, fluid retention, lower blood pressure.       Side effects: frequent urination, weakness, muscle cramps, increased sensitivity to light, nausea, and dizziness  CONTINUE taking these medications          albuterol sulfate HFA 108 (90 BASE) MCG/ACT inhaler   Commonly known as:  PROVENTIL HFA   Inhale 2 puffs into the lungs every 6 hours as needed for Wheezing   Notes to Patient:  VHQ:[id number], to open airways, rescue inhaler  Side effects: nervousness, jitteriness, shakiness, headache, fast heartbeat       ALPRAZolam 0.25 MG tablet   Commonly known as:  XANAX   Take 1 tablet by mouth 2 times daily as needed for Anxiety   Notes to Patient:  Use: eases anxiety and calms the nerves  Side effects: feeling lightheaded, sleepy, having blurred eyesight, confusion         aspirin 81 MG EC tablet   Take 1 tablet by mouth daily   Notes to Patient:  Use: prevents heart attacks and strokes.  Side effects: bleeding or bruising more easily, stomach upset.         atorvastatin 80 MG tablet   Commonly known as:  LIPITOR   Take 1 tablet by mouth daily   Notes to Patient:  Use:  lower cholesterol; prevent heart attack/stroke  Side effects: headache, muscle pain/weakness         busPIRone 10 MG tablet   Commonly known as:  BUSPAR   Take 1 tablet by mouth 3 times daily   Notes to Patient:  Use: lower  Blood pressure, prevent heart failure.  Side effects: cough, change in taste, and dizziness.  Call MD right away if lips or throats begin to swell         CALCIUM-VITAMIN D PO   Notes to Patient:  Use: prevention and treatment of low vitamin D  Side effects: muscle weakness/twitching fatigue, constipation, upset stomach         hydrALAZINE 25 MG tablet   Commonly known as:  APRESOLINE   Take 1 tablet by mouth 3 times daily   Notes to Patient:  Use: treat heart failure, lower blood pressure, prevent and treat chest pain  Side effects: headache and dizziness         lisinopril 40 MG tablet   Commonly known as:  PRINIVIL;ZESTRIL   Notes to Patient:  Use: lower  Blood pressure, prevent heart failure.  Side effects: cough, change in taste, and dizziness.  Call MD right away if lips or throats begin to swell         metoprolol 100 MG tablet   Commonly known as:  LOPRESSOR   Take 1.5 tablets by mouth 2 times daily   Notes to Patient:  Use: treat heart failure, prevent future heart attacks, lower blood pressure and heart rate, treat chest pain  Side effects: dizziness, fatigue, and diarrhea         montelukast 10 MG tablet   Commonly known as:  SINGULAIR   TAKE ONE TABLET BY MOUTH ONE TIME A DAY   Notes to Patient:  Use: prevention and treatment of asthma  Side effects: diarrhea, headache, stomach upset, sore throat         MULTIVITAMIN PO   Notes to Patient:  Use: Dietary supplement  Side Effects: Constipation, diarrhea, stool discoloration         omeprazole 40 MG delayed release capsule   Commonly known as:  PRILOSEC   Take 1 capsule by mouth daily.   Notes to Patient:  Use: Prevention and treatment of gastric ulcers  Side Effects: Headache, fatigue, constipation, dry  mouth  ondansetron 4 MG  tablet   Commonly known as:  ZOFRAN   Take 1 tablet by mouth daily as needed for Nausea or Vomiting   Notes to Patient:  Use: nausea and vomiting  Side effects: headache, weakness or dizziness         potassium chloride 20 MEQ extended release tablet   Commonly known as:  KLOR-CON M   Take 1 tablet by mouth daily   Notes to Patient:  Use: potassium supplement  Side effect: nausea, diarrhea         prasugrel 10 MG Tabs   Commonly known as:  EFFIENT   Take 1 tablet by mouth daily   Notes to Patient:  Use: prevents closing of your stent.   Side effects: Bleeding or bruising more easily         traZODone 100 MG tablet   Commonly known as:  DESYREL   TAKE 1 TABLET BY MOUTH NIGHTLY   Notes to Patient:  Use: Treatment of depressive disorder  Side effects: Fatigue, constipation, insomnia         triamcinolone 0.1 % cream   Commonly known as:  KENALOG   Apply to affected areas twice daily for up to 2 weeks or until improved.       ULTRAM 50 MG tablet   Generic drug:  traMADol   Notes to Patient:  Use:  Lessens pain  Side effects: Drowsiness, lightheadedness or fainting, constipation         venlafaxine 150 MG extended release capsule   Commonly known as:  EFFEXOR XR   Take 1 capsule by mouth daily   Notes to Patient:  Use: Treatment of generalized anxiety disorder  Side effects: Insomnia, weight loss, lack of appetite, fatigue, constipation         ZYRTEC ALLERGY 10 MG tablet   Generic drug:  cetirizine   Notes to Patient:  Use: Allergies  Side effects: Headache, back pain, dizziness              Where to Get Your Medications      You can get these medications from any pharmacy     Bring a paper prescription for each of these medications    ??? nitroGLYCERIN 0.4 MG SL tablet             Discharge recommendations given to patient.  Follow Up. pcpin 1 week   Disposition.  home  Activity. activity as tolerated  Diet: DIET CARDIAC;      Spent 45  minutes in discharge process.    Signed:  Trena Platt, MD     04/07/2016 10:58 AM

## 2016-04-07 NOTE — Progress Notes (Signed)
[name]   Daily Progress Note      Admit Date:  04/05/2016      Subjective: patient denies any chest pain. Right groin site unremarkable.    HPI:    Rachael Mays is a 60 y.o. with history of CAD s/p recent cath and LAD stent, morbid obesity, OSA on CPAP, HTN who has presented to the hospital with chest pain. Pain is substernal, constant with no radiation. She states that she had chest pain when left hospital after PCI. Pain is similar. She is compliant with her medications. She has lost weight with lasix therapy. This morning she denies any chest pain. No palpitation, syncope.   ??    The patient was seen and examined. Notes, labs and recent testing reviewed.   There were not complications over night.    The patient is being seen for coronary artery disease.      Objective:     BP 128/75   Pulse 61   Temp 98 ??F (36.7 ??C) (Oral)    Resp 17   Ht 5\' 6"  (1.676 m)   Wt (!) 310 lb 8 oz (140.8 kg)   SpO2 97%   BMI 50.12 kg/m2     Intake/Output Summary (Last 24 hours) at 04/07/16 0730  Last data filed at 04/06/16 1906   Gross per 24 hour   Intake              960 ml   Output                0 ml   Net              960 ml       Physical Exam:  General:  Awake, alert, NAD  Skin:  Warm and dry  Neck:  JVD<8, no bruit  Chest:  Clear to auscultation, no wheezes/rhonchi/rales  Cardiovascular:  RRR S1S2  Abdomen:  Soft, nontender, +bowel sounds  Extremities:  generalized edema, right groin site unremarkable.     Medications:   ??? aspirin  81 mg Oral Daily   ??? atorvastatin  80 mg Oral Daily   ??? busPIRone  10 mg Oral TID   ??? cetirizine  10 mg Oral Daily   ??? furosemide  40 mg Oral BID   ??? lisinopril  40 mg Oral Daily   ??? metoprolol  150 mg Oral BID   ??? montelukast  10 mg Oral Nightly   ??? pantoprazole  40 mg Oral QAM AC   ??? potassium chloride  20 mEq Oral Daily   ??? prasugrel  10 mg Oral Daily   ??? traZODone  100 mg Oral Nightly   ??? venlafaxine  150 mg Oral Daily   ??? sodium chloride flush  10 mL Intravenous 2 times per  day   ??? enoxaparin  40 mg Subcutaneous Daily   ??? hydrALAZINE  25 mg Oral TID        ALPRAZolam, sodium chloride flush, acetaminophen, magnesium hydroxide, ondansetron, nitroGLYCERIN, oxyCODONE-acetaminophen, albuterol sulfate HFA, promethazine    Lab Data:  CBC:   Recent Labs      04/04/16   2345   WBC  8.1   HGB  13.7   PLT  184     BMP:  Recent Labs      04/04/16   2345  04/06/16   0459   NA  139  141   K  4.2  4.0   CO2  25  27   BUN  16  20   CREATININE  1.0  0.9     LIVR: No results for input(s): AST, ALT in the last 72 hours.  PT/INR:   Recent Labs      04/04/16   2340   INR  0.96     APTT:   Recent Labs      04/04/16   2340   APTT  26.7     BNP:  No results for input(s): BNP in the last 72 hours.  CARDIAC ENZYMES:  Recent Labs      04/04/16   2345  04/05/16   0528  04/05/16   1107   TROPONINI  <0.01  <0.01  <0.01     FASTING LIPID PANEL:  Lab Results   Component Value Date    CHOL 208 05/04/2014    HDL 49 05/04/2014    HDL 52 03/31/2011    TRIG 137 05/04/2014       Diagnostics:    ECHO: 04/06/16     Summary   Normal left ventricle size and systolic function with an estimated ejection   fraction of 65%.   There is mild concentric left ventricular hypertrophy.   Mild mitral regurgitation is present.   The aortic valve appears sclerotic but opens well.   There is mild tricuspid regurgitation with RVSP estimated at 42 mmHg.   Mild pulmonic regurgitation present.   Definity was used to better delineate endocardium.     Cardiac Cath 03/31/16:  Anatomy:   LM-normal   LAD-70% mid with FFR 0.77  Cx-normal, codominant  OM1- normal  RCA-normal  RPDA- normal  LVEF- 70%, LVEDP 19   PCI: LAD 70% to 0% with 3.25 mm x 18 mm Xience Alpine drug-eluting stent      Hemodynamics:  RA-15/12 mean 9  RV- 60/0, 9  PAWP-20/26 mean 19  PA- 60/12 mean 33          Impression:  1.  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  2.  Hyperdynamic LV systolic function.  3.  Mildly decompensated diastolic  CHF.  4.  Moderate pulmonary hypertension.      Plan:  1.  Aspirin indefinitely.  2.  Effient/equivalent for minimum of 1 year.  3.  IV diuresis while in the hospital and then discharged on Lasix 40 mg by mouth twice a day.  4.  Continue BiPAP therapy.  5.  Outpatient cardiac rehab.  6.  CHF service follow-up on discharge.     CTPA 03/25/16:  No evidence of pulmonary embolism or acute pulmonary abnormality.      Chest x-ray 03/25/16:  No acute process.      ECG 25-Mar-2016 17:28:21 [facility]:  Sinus bradycardia  Otherwise normal ECG  No significant change      CTPA 03/10/16:  No evidence of pulmonary embolus.   Small right and trace left pleural effusions, new as well as interlobular   septal wall thickening.  Findings likely related to edema.   Mild mediastinal adenopathy, new.  Findings may be reactive.  Recommend   follow-up to ensure resolution.               Chest x-ray 02/13/16:  FINDINGS:   Cardiomegaly.  There appears be pulmonary vascular congestion and   interstitial edema.  No focal airspace disease.          Echo 10/07/15:  Summary   Technically limited study due to body  habitus.   Normal left ventricle size and systolic function with an estimated ejection fraction of 55%. No regional wall motion abnormalities are seen.   There is mild concentric left ventricular hypertrophy.      Cardiac Cath 09/20/15:  Anatomy:   LM-normal  LAD-normal  Cx-normal  OM1- normal  RCA-normal, codominant  RPDA- normal  LVEF- 60%      Impression:  1. Normal coronaries  2. Normal LV systolic function  3. False positive stress test          Myoview Stress Test  09/18/15:  Summary    -Moderate sized anterior mostly reversible defect consistent with ischemia    in the territory of the mid LAD .    -Normal LV function.    -Study is limited by breast attenuation.       Cardiac cath 06/03/12:  Normal coronaries and LVEF          Patient Active Problem List   Diagnosis   ??? Back pain   ??? Deep vein thrombosis ([place])   ??? Depression   ???  Hyperlipidemia   ??? Pulmonary embolism ([place])   ??? Nausea & vomiting   ??? Abdominal  pain, other specified site   ??? Morbid obesity due to excess calories ([place])   ??? Status post gastric banding   ??? Vertigo   ??? Dizziness   ??? Tinnitus, subjective   ??? Other complications of gastric band procedure   ??? Essential hypertension   ??? Hyperglycemia   ??? Atypical chest pain   ??? 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 Adrenal mass, left ([place])   ??? Adrenal nodule ([place])   ??? Obstructive sleep apnea (adult) (pediatric)   ??? Dyspnea and respiratory abnormalities   ??? Abnormal stress test   ??? Shortness of breath   ??? Acute diastolic congestive heart failure ([place])   ??? Coronary artery disease due to lipid rich plaque   ??? Pulmonary hypertension ([place])   ??? Unstable angina ([place])   ??? 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 Chest pain       Assessment/plan:   - Chest pain:                         - Atypical. Constant chest pain. She had chest pain when left the hospital.                          - currently the patient denies any chest pain.                        - ECG with no changes and troponin negative.                         - Continue aggressive medical therapy with ASA, BB, and ACE-i.                        - 04/06/16 ECHO showed stable findings.                        - Plan: refer the patient to pain management.     - CAD: s/p recent PCI                        -  on DAPT and statin.      - HTN: Controlled.      - Morbid obesity: Body mass index is 49.28 kg/(m^2).      - OSA on CPAP.       Dispo:    Cardiology standpoint S/O, please call with any questions.  Please see orders.  Discussed with patient and nursing.      The patient is on a beta-blocker : Lopressor  The patient is on an ace-i/ARB : Lisinopril  The patient is on a statin : Lipitor  The patient is on antiplatelet therapy : ASA and Effient  The patient does not have history of AF, and is not on anticoagulation    Signed:  Adele Barthel,  [place]  [phone]

## 2016-04-07 NOTE — Progress Notes (Signed)
Data- discharge order received, pt verbalized agreement to discharge, disposition to previous residence, no needs for HHC/DME.     Action- discharge instructions prepared and given to patient, pt verbalized understanding. Medication information packet given r/t NEW and/or CHANGED prescriptions emphasizing name/purpose/side effects, pt verbalized understanding. Discharge instruction summary: Diet-Cardiac, Activity- As tolerated, Primary Care Physician as follows: Acquanetta Sit SLOUGH, MD [phone] f/u appointment in 1-2 weeks, immunizations reviewed, prescription medications filled at [facility]. Inpatient surgical procedure precautions reviewed: N/A     Response- Pt belongings gathered, IV/Tele removed. Disposition is home (no HHC/DME needs), transported with Husband, taken to lobby via w/c w/ Transport, no complications.

## 2016-04-08 MED ORDER — METOPROLOL TARTRATE 100 MG PO TABS
100 MG | ORAL_TABLET | ORAL | 1 refills | Status: DC
Start: 2016-04-08 — End: 2016-07-17

## 2016-04-08 NOTE — Telephone Encounter (Signed)
lisinopril (PRINIVIL;ZESTRIL) 40 MG tablet        ?? Sig - Route: Take 40 mg by mouth daily Indications     omeprazole (PRILOSEC) 40 MG capsule 30 capsule 3 07/21/2013     ?? Sig - Route: Take 1 capsule by mouth daily. - Oral     [facility] mail order in chart

## 2016-04-08 NOTE — Care Coordination-Inpatient (Signed)
Left message to call transition care coordinator from [facility] @ [phone].    Need to inform appt scheduled with PCP 04/09/16 @ 3:30 PM

## 2016-04-08 NOTE — Telephone Encounter (Signed)
Has appointment tomorrow will refill at that time.

## 2016-04-09 ENCOUNTER — Ambulatory Visit
Admit: 2016-04-09 | Discharge: 2016-04-09 | Payer: PRIVATE HEALTH INSURANCE | Attending: Family Medicine | Primary: Sports Medicine

## 2016-04-09 DIAGNOSIS — I2510 Atherosclerotic heart disease of native coronary artery without angina pectoris: Secondary | ICD-10-CM

## 2016-04-09 MED ORDER — OMEPRAZOLE 40 MG PO CPDR
40 | ORAL_CAPSULE | Freq: Every day | ORAL | 3 refills | Status: DC
Start: 2016-04-09 — End: 2017-02-16

## 2016-04-09 MED ORDER — BUSPIRONE HCL 10 MG PO TABS
10 | ORAL_TABLET | Freq: Three times a day (TID) | ORAL | 3 refills | Status: DC
Start: 2016-04-09 — End: 2016-10-08

## 2016-04-09 MED ORDER — LISINOPRIL 40 MG PO TABS
40 | ORAL_TABLET | Freq: Every day | ORAL | 3 refills | Status: DC
Start: 2016-04-09 — End: 2017-02-16

## 2016-04-09 MED ORDER — POTASSIUM CHLORIDE CRYS ER 20 MEQ PO TBCR
20 | ORAL_TABLET | Freq: Every day | ORAL | 3 refills | Status: DC
Start: 2016-04-09 — End: 2016-04-30

## 2016-04-09 MED ORDER — FUROSEMIDE 40 MG PO TABS
40 MG | ORAL_TABLET | Freq: Every day | ORAL | 3 refills | Status: DC
Start: 2016-04-09 — End: 2016-04-30

## 2016-04-09 NOTE — Progress Notes (Signed)
Subjective:      Patient ID: Rachael Mays is a 60 y.o. female.    HPI  Patient presents for hospital follow-up. She was admitted from 03/31/2016 04/02/2016. During that admission she complained of shortness of breath and chest pain. She underwent a cardiac catheter that showed moderate pulmonary hypertension, diastolic dysfunction, and a significant one vessel CAD. She underwent heart catheterization with drug-eluting stent implantation in the mid LAD. She required IV Lasix for diuresis during her admission. She was advised to stay on aspirin indefinitely. Effient for at least one year was begun. She was discharged on 40 mg of Lasix daily and asked to continue her home BiPAP. Patient returned to the hospital on 04/05/2016 and was admitted through 04/07/2016 for chest pain. EKG and troponins negative. No change made to medications.    Today patient feels overall improved. She is happy that her swelling is down. There is some confusion about her blood pressure medicines. She had not been taking her lisinopril prior to recent hospitalizations even though it was documented in on her medication list. She sometimes only takes 2 doses of her hydralazine as she feels like her blood pressure goes down too low, down to 112/47 at one point.    Still with some general stressors at home. She would like to continue BuSpar 3 times a day as needed for anxiety. She reports this was strongly encouraged by her cardiologist as well.  Review of Systems    Patient Active Problem List   Diagnosis   ??? Back pain   ??? Deep vein thrombosis ([place])   ??? Depression   ??? Hyperlipidemia   ??? Pulmonary embolism ([place])   ??? Nausea & vomiting   ??? Abdominal  pain, other specified site   ??? Morbid obesity due to excess calories ([place])   ??? Status post gastric banding   ??? Vertigo   ??? Dizziness   ??? Tinnitus, subjective   ??? Other complications of gastric band procedure   ??? Essential hypertension   ??? Hyperglycemia   ??? Atypical chest pain   ??? 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 Adrenal mass, left ([place])   ??? Adrenal nodule ([place])   ??? Obstructive sleep apnea (adult) (pediatric)   ??? Dyspnea and respiratory abnormalities   ??? Abnormal stress test   ??? Shortness of breath   ??? Acute diastolic congestive heart failure ([place])   ??? Coronary artery disease due to lipid rich plaque   ??? Pulmonary hypertension ([place])   ??? Unstable angina ([place])   ??? 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 Chest pain       Outpatient Prescriptions Marked as Taking for the 04/09/16 encounter (Office Visit) with Harvel Quale, MD   Medication Sig Dispense Refill   ??? lisinopril (PRINIVIL;ZESTRIL) 40 MG tablet Take 1 tablet by mouth daily Indications: Patient thought this was dc'd in December by PCP 90 tablet 3   ??? omeprazole (PRILOSEC) 40 MG delayed release capsule Take 1 capsule by mouth daily 90 capsule 3   ??? potassium chloride (KLOR-CON M) 20 MEQ extended release tablet Take 1 tablet by mouth daily 90 tablet 3   ??? busPIRone (BUSPAR) 10 MG tablet Take 1 tablet by mouth 3 times daily 270 tablet 3   ??? furosemide (LASIX) 40 MG tablet Take 1 tablet by mouth daily 90 tablet 3   ??? metoprolol (LOPRESSOR) 100 MG tablet TAKE TAKE 1 AND 1/2  TABLET BY MOUTH TABLETS BY MOUTH TWO TIMES A DAY 270 tablet 1   ??? oxyCODONE-acetaminophen (PERCOCET) 5-325 MG per tablet Take 1 tablet by mouth every 6 hours as needed for Pain . 40 tablet 0   ??? aspirin 81 MG EC tablet Take 1 tablet by mouth daily 30 tablet 11   ??? prasugrel (EFFIENT) 10 MG TABS Take 1 tablet by mouth daily 90 tablet 3   ??? atorvastatin (LIPITOR) 80 MG tablet Take 1 tablet by mouth daily 90 tablet 3   ??? traMADol (ULTRAM) 50 MG tablet Take 100 mg by mouth every 8 hours as needed for Pain     ??? triamcinolone (KENALOG) 0.1 % cream Apply to affected areas twice daily for up to 2 weeks or until improved. 80 g 1   ??? ALPRAZolam (XANAX) 0.25 MG tablet Take 1 tablet by mouth 2 times daily as needed for Anxiety 60 tablet 0   ???  montelukast (SINGULAIR) 10 MG tablet TAKE ONE TABLET BY MOUTH ONE TIME A DAY 90 tablet 1   ??? traZODone (DESYREL) 100 MG tablet TAKE 1 TABLET BY MOUTH NIGHTLY 90 tablet 1   ??? albuterol sulfate HFA (PROVENTIL HFA) 108 (90 BASE) MCG/ACT inhaler Inhale 2 puffs into the lungs every 6 hours as needed for Wheezing 3 Inhaler 3   ??? Blood Pressure Monitor KIT Use daily as needed 1 kit 0   ??? hydrALAZINE (APRESOLINE) 25 MG tablet Take 1 tablet by mouth 3 times daily 270 tablet 3   ??? ondansetron (ZOFRAN) 4 MG tablet Take 1 tablet by mouth daily as needed for Nausea or Vomiting 30 tablet 1   ??? venlafaxine (EFFEXOR XR) 150 MG XR capsule Take 1 capsule by mouth daily 90 capsule 3   ??? Multiple Vitamins-Minerals (MULTIVITAMIN PO) Take by mouth     ??? CALCIUM-VITAMIN D PO Take by mouth     ??? cetirizine (ZYRTEC ALLERGY) 10 MG tablet Take 10 mg by mouth daily.         Allergies   Allergen Reactions   ??? Morphine Itching   ??? [place]win [Pentazocine] Other (See Comments)     dizzy       Social History   Substance Use Topics   ??? Smoking status: Former Smoker     Packs/day: 0.50     Years: 40.00     Types: Cigarettes     Quit date: 08/07/2012   ??? Smokeless tobacco: Never Used      Comment: started to smoke at age 35 / only smoked 0.5 p.p.d    ??? Alcohol use 0.0 oz/week     0 Standard drinks or equivalent per week      Comment: occasionally        Objective:   BP 120/78   Pulse 66   Wt (!) 304 lb 12.8 oz (138.3 kg)   SpO2 98%   BMI 49.2 kg/m2    Physical Exam   Constitutional: She is oriented to person, place, and time. She appears well-developed and well-nourished. No distress.   Cardiovascular: Normal rate, regular rhythm and normal heart sounds.    No murmur heard.  Pulmonary/Chest: Effort normal and breath sounds normal. She has no wheezes. She has no rales.   Neurological: She is alert and oriented to person, place, and time.   Psychiatric: She has a normal mood and affect. Her behavior is normal.       Assessment:     1. Coronary artery disease  involving  native heart without angina pectoris, unspecified vessel or lesion type     2. Acute diastolic congestive heart failure (HCC)  potassium chloride (KLOR-CON M) 20 MEQ extended release tablet   3. Essential hypertension  lisinopril (PRINIVIL;ZESTRIL) 40 MG tablet   4. Hyperlipidemia, unspecified hyperlipidemia type     5. Obstructive sleep apnea (adult) (pediatric)     6. Gastroesophageal reflux disease without esophagitis  omeprazole (PRILOSEC) 40 MG delayed release capsule   7. Anxiety  busPIRone (BUSPAR) 10 MG tablet       Plan:     Continue current medications.  Continue aspirin and Effient for new drug-eluting stent.  Lasix 40 mg daily and potassium 20 mEq daily refilled. Check BMP in 2-4 weeks.  Continue statin Lipitor at 80 mg daily.  Continue BiPAP nightly.  Omeprazole refilled for GERD.  Okay for BuSpar 3 times a day when necessary anxiety.

## 2016-04-09 NOTE — Care Coordination-Inpatient (Signed)
Care Transition [facility] Follow Up Call    Date:  04/09/2016   Patient: Rachael Mays  DOB: 09-28-1956  MRN: <[id number]>    Date of discharge:  04/07/16  [facility]ility[facility]  Non-face-to-face services provided:  Scheduled appointment with PCP-04/09/16 @ 3:30 PM  Obtained and reviewed discharge summary and/or continuity of care documents    Spoke with: [name]    1. How have you been feeling since you left the hospital/experiencing any symptoms? Pt stated she was feeling better, denied any chest pain today.  Any intervention needed? no     2. Do you have a copy of your discharge instructions? yes     3. What questions do you have about these instructions? No questions    4. Were you able to get all of your new prescriptions filled? yes   If Not, why? na        5. Has a Sports administrator called?  NA If No, please ask below:    May we review the medications you've been taking since you got home?        yes     Would you tell me how you are taking each medication and what you are taking it for?     Reviewed meds with the pt.  Pt stated she needs 2 R/F has appt with PCP this afternoon, will discuss then.  Also pt has been taking hydralazine BID, ordered TID    Current Outpatient Prescriptions   Medication Sig Dispense Refill   ??? metoprolol (LOPRESSOR) 100 MG tablet TAKE TAKE 1 AND 1/2 TABLET BY MOUTH TABLETS BY MOUTH TWO TIMES A DAY 270 tablet 1   ??? oxyCODONE-acetaminophen (PERCOCET) 5-325 MG per tablet Take 1 tablet by mouth every 6 hours as needed for Pain . 40 tablet 0   ??? nitroGLYCERIN (NITROSTAT) 0.4 MG SL tablet up to max of 3 total doses. If no relief after 1 dose, call 911. 25 tablet 3   ??? aspirin 81 MG EC tablet Take 1 tablet by mouth daily 30 tablet 11   ??? furosemide (LASIX) 40 MG tablet Take 1 tablet by mouth daily (Patient taking differently: Take 40 mg by mouth 2 times daily Indications: as directed by cardiology ) 90 tablet 3   ??? prasugrel (EFFIENT) 10 MG TABS Take 1 tablet by mouth daily 90 tablet 3    ??? atorvastatin (LIPITOR) 80 MG tablet Take 1 tablet by mouth daily 90 tablet 3   ??? traMADol (ULTRAM) 50 MG tablet Take 100 mg by mouth every 8 hours as needed for Pain     ??? potassium chloride (KLOR-CON M) 20 MEQ extended release tablet Take 1 tablet by mouth daily 30 tablet 5   ??? triamcinolone (KENALOG) 0.1 % cream Apply to affected areas twice daily for up to 2 weeks or until improved. 80 g 1   ??? ALPRAZolam (XANAX) 0.25 MG tablet Take 1 tablet by mouth 2 times daily as needed for Anxiety 60 tablet 0   ??? busPIRone (BUSPAR) 10 MG tablet Take 1 tablet by mouth 3 times daily 90 tablet 3   ??? montelukast (SINGULAIR) 10 MG tablet TAKE ONE TABLET BY MOUTH ONE TIME A DAY 90 tablet 1   ??? lisinopril (PRINIVIL;ZESTRIL) 40 MG tablet Take 40 mg by mouth daily Indications: Patient thought this was dc'd in December by PCP      ??? traZODone (DESYREL) 100 MG tablet TAKE 1 TABLET BY MOUTH NIGHTLY 90 tablet 1   ???  albuterol sulfate HFA (PROVENTIL HFA) 108 (90 BASE) MCG/ACT inhaler Inhale 2 puffs into the lungs every 6 hours as needed for Wheezing 3 Inhaler 3   ??? Blood Pressure Monitor KIT Use daily as needed 1 kit 0   ??? hydrALAZINE (APRESOLINE) 25 MG tablet Take 1 tablet by mouth 3 times daily 270 tablet 3   ??? ondansetron (ZOFRAN) 4 MG tablet Take 1 tablet by mouth daily as needed for Nausea or Vomiting 30 tablet 1   ??? venlafaxine (EFFEXOR XR) 150 MG XR capsule Take 1 capsule by mouth daily 90 capsule 3   ??? Multiple Vitamins-Minerals (MULTIVITAMIN PO) Take by mouth     ??? CALCIUM-VITAMIN D PO Take by mouth     ??? omeprazole (PRILOSEC) 40 MG capsule Take 1 capsule by mouth daily. 30 capsule 3   ??? cetirizine (ZYRTEC ALLERGY) 10 MG tablet Take 10 mg by mouth daily.       No current facility-administered medications for this visit.      6. Have you scheduled your follow up appointment? yes      How are you going to get there? car    7. Has someone from home health been in contact with you?  no    8. Who else is helping you at home?   Does  anyone in your home need your help?     9. Do you feel like you have everything you need to keep you well at home? yes  DME?    Contact information provided to patient and encouraged them to call me with any needs/concerns.    Follow up appointments: Future Appointments  Date Time Provider [place]   04/09/2016 3:30 PM Harvel Quale, MD [facility]   04/21/2016 10:30 AM Dorian Furnace, CNP [facility] Card[facility]   05/05/2016 3:30 PM Orson Ape, CNP [facility] SLEEP [facility]   06/25/2016 11:30 AM Francisca December, MD [facility] [place] RN  Care Transition Coordinator  [phone]

## 2016-04-09 NOTE — Progress Notes (Signed)
Patient is here for a hospital follow-up from [facility] for chest pain for four days.  Patient states that her chest pain has subsided.  Patient states that she is a little weak but feeling better overall.

## 2016-04-09 NOTE — Care Coordination-Inpatient (Signed)
Left message to call transition care coordinator from [facility] @ [phone].

## 2016-04-13 NOTE — Telephone Encounter (Signed)
Patient just got a Chief Operating Officer in the mail.She needs Dr.   Murrell Converse to write a letter for her excusing her from [employer].  She just spent 8 days in the hospital getting a Stent in her heart.    The letter needs to be in as soon as possible. She got the   Letter while she was in the hospital  Her Juror Number is [id number]  Fax # Attn Jenny Reichmann  [phone]

## 2016-04-13 NOTE — Telephone Encounter (Signed)
Printed.  Please fax.

## 2016-04-13 NOTE — Telephone Encounter (Signed)
Left message stating letter is ready for pick-up at front desk.

## 2016-04-13 NOTE — Telephone Encounter (Signed)
Please advise

## 2016-04-21 ENCOUNTER — Encounter: Attending: Acute Care | Primary: Sports Medicine

## 2016-04-23 NOTE — Telephone Encounter (Signed)
Pt calling she has been having chest pains, and SOB is this normal 2 wks after having a stent? Pls call to advise Thank you

## 2016-04-23 NOTE — Telephone Encounter (Signed)
Rachael Mays had one stent placed 03/31/16. She states the pain is the same as before the stent. She awakened with sharp pains in chest, sometimes heavy. She hasn't done much physically. Pain is constant with some SOB when up and about. She tried a NTG just now with mild relief.     I discussed with Dr. Susy Manor. He said she has "chronic" chest pains that are likely related to a cervical orthopedic issue. She should just see how she feels today and tomorrow. NTG likely won't be much help.    I gave her the instructions and told her to call if anything worsens or go to the ER. Otherwise, she will keep app't for 04/30/16.

## 2016-04-29 NOTE — Telephone Encounter (Signed)
atorvastatin (LIPITOR) 80 MG tablet 90 tablet 3 04/02/2016      ?? Sig - Route: Take 1 tablet by mouth daily - Oral    ?? E-Prescribing Status: Receipt confirmed by pharmacy (04/02/2016 ??8:47 AM EDT)      I let patient know that pharmacy already has a years worth of prescription. And to call them to see what is talking so long.     Patient said that she would call tomorrow.

## 2016-04-29 NOTE — Telephone Encounter (Deleted)
atorvastatin (LIPITOR) 80 MG tablet 90 tablet 3 04/02/2016     Sig - Route: Take 1 tablet by mouth daily - Oral    E-Prescribing Status: Receipt confirmed by pharmacy (04/02/2016 ??8:47 AM EDT)

## 2016-04-29 NOTE — Telephone Encounter (Signed)
Medication Refill    When was your last appointment with cardiology?03/30/16  (if 1year or longer, please schedule an appointment)    Medication needing refilled:atorvastatin (LIPITOR) 80 MG tablet    Doseage of the medication:80 mg    How are you taking this medication (QD, BID, TID, QID, PRN):1 tab once a day    Patient want a 30 or 90 day supply called in:90    Which Pharmacy are we sending the medication MQ:[id number], [place]    Pharmacy Phone number:    Pharmacy Fax number:

## 2016-04-30 ENCOUNTER — Encounter: Admit: 2016-04-30 | Discharge: 2016-04-30 | Payer: PRIVATE HEALTH INSURANCE | Primary: Sports Medicine

## 2016-04-30 ENCOUNTER — Ambulatory Visit
Admit: 2016-04-30 | Discharge: 2016-04-30 | Payer: PRIVATE HEALTH INSURANCE | Attending: Cardiovascular Disease | Primary: Sports Medicine

## 2016-04-30 DIAGNOSIS — R0602 Shortness of breath: Secondary | ICD-10-CM

## 2016-04-30 DIAGNOSIS — R0789 Other chest pain: Secondary | ICD-10-CM

## 2016-04-30 MED ORDER — ATORVASTATIN CALCIUM 80 MG PO TABS
80 | ORAL_TABLET | Freq: Every day | ORAL | 3 refills | Status: DC
Start: 2016-04-30 — End: 2016-07-27

## 2016-04-30 MED ORDER — FUROSEMIDE 40 MG PO TABS
40 | ORAL_TABLET | Freq: Two times a day (BID) | ORAL | 3 refills | Status: DC
Start: 2016-04-30 — End: 2016-05-21

## 2016-04-30 MED ORDER — POTASSIUM CHLORIDE CRYS ER 20 MEQ PO TBCR
20 | ORAL_TABLET | Freq: Two times a day (BID) | ORAL | 3 refills | Status: DC
Start: 2016-04-30 — End: 2016-05-21

## 2016-04-30 NOTE — Patient Instructions (Signed)
Cardiac event monitor continuous monitoring.  Increase lasix 40 mg to twice daily.  Increase potassium 20 meq.  Follow up in 6 weeks.  Labs before next visit cmp, cbc and fasting lipid.

## 2016-04-30 NOTE — Progress Notes (Signed)
[facility]   Cardiac Follow up    Referring Provider:  Harvel Quale, MD     Chief Complaint   Patient presents with   ??? Follow-Up from Hospital     still has SOB, chest pain since stent placed.        History of Present Illness:  Rachael Mays presents for follow up for chest pain and shortness of breath. She had normal coronaries angiogram in October 2016 and June 2013.  She underwent repeat cardiac cath in April 2017 and at that time was noted to have significant stenosis in the mid LAD which was revascularized with drug-eluting stent implantation.  This had no impact on her chest pain in terms of resolution.  She has morbid obesity. She was seen by Dr. Jeb Levering who did a follow up CTA of her chest and CTPA  for continued shortness of breath 03/25/16 that showed no evidence of pulmonary embolism or acute pulmonary abnormality. A chest xray on the same day suggested congestive heart failure.  Her diuretics were increased and her swelling improved but she essentially had no improvement in her shortness of breath and chest pain.  The chest pain did have a component of tenderness on palpation in the past.  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  It was hypothesized that this may be contributing to the left-sided chest discomfort.  She has been seen by Dr. Diana Eves, pain management.    Today, she continues to have the chest discomfort but there is not a component of tenderness on palpation.  She has not had resolution from the chest discomfort since its onset which was prior to her cardiac cath.  She still notes some shortness of breath.  She has been having intermittent irregular heart beats. The palpitations randomly occur.  They are associated with recent flushing.    Past Medical History:   has a past medical history of Anxiety; Asthma; CAD (coronary artery disease); Chronic back pain; Deep vein thrombosis; Depression; GERD  (gastroesophageal reflux disease); GERD (gastroesophageal reflux disease); Hx of blood clots; Hyperlipidemia; Hypertension; Obesity; Obstructive sleep apnea (adult) (pediatric); and Pulmonary embolism ([place]).    Surgical History:   has a past surgical history that includes Colonoscopy; Cesarean section; Cholecystectomy; Hysterectomy; shoulder surgery; Lap Band (05/01/08); back surgery; Colonoscopy (04/08/10); other surgical history; Upper gastrointestinal endoscopy (05/19/13); other surgical history (07/05/13); Upper gastrointestinal endoscopy (07/21/13); partial hysterectomy (cervix not removed); Upper gastrointestinal endoscopy (N/A, 07/31/2014); vascular surgery (04/2015); Diagnostic Cardiac Cath Lab Procedure; and Coronary angioplasty.     Social History:   reports that she quit smoking about 3 years ago. Her smoking use included Cigarettes. She has a 20.00 pack-year smoking history. She has never used smokeless tobacco. She reports that she drinks alcohol. She reports that she does not use illicit drugs.     Family History:  family history includes Arthritis in her mother; Cancer in her mother; Cancer (age of onset: 33) in her father; Depression in her mother; Diabetes in an other family member; Early Death in her paternal grandmother; Heart Disease in her maternal grandmother and paternal grandfather; Heart Disease (age of onset: 42) in her mother; High Blood Pressure in her mother and another family member; Obesity in an other family member; Other in her brother, brother, daughter, and sister; Ovarian Cancer (age of onset: 23) in her mother.     Home Medications:  Prior to Admission medications  Medication Sig Start Date End Date Taking? Authorizing Provider   potassium chloride (KLOR-CON M) 20 MEQ extended release tablet Take 1 tablet by mouth 2 times daily 04/30/16  Yes Leodis Binet, MD   furosemide (LASIX) 40 MG tablet Take 1 tablet by mouth 2 times daily 04/30/16  Yes Leodis Binet, MD   atorvastatin  (LIPITOR) 80 MG tablet Take 1 tablet by mouth daily 04/30/16  Yes Leodis Binet, MD   lisinopril (PRINIVIL;ZESTRIL) 40 MG tablet Take 1 tablet by mouth daily Indications: Patient thought this was dc'd in December by PCP 04/09/16  Yes Harvel Quale, MD   omeprazole [facility]) 40 MG delayed release capsule Take 1 capsule by mouth daily 04/09/16  Yes Harvel Quale, MD   busPIRone (BUSPAR) 10 MG tablet Take 1 tablet by mouth 3 times daily 04/09/16  Yes Harvel Quale, MD   metoprolol (LOPRESSOR) 100 MG tablet TAKE TAKE 1 AND 1/2 TABLET BY MOUTH TABLETS BY MOUTH TWO TIMES A DAY 04/08/16  Yes Harvel Quale, MD   oxyCODONE-acetaminophen (PERCOCET) 5-325 MG per tablet Take 1 tablet by mouth every 6 hours as needed for Pain . 04/07/16  Yes Bhumit Patel, MD   nitroGLYCERIN (NITROSTAT) 0.4 MG SL tablet up to max of 3 total doses. If no relief after 1 dose, call 911. 04/06/16  Yes Bhumit Patel, MD   aspirin 81 MG EC tablet Take 1 tablet by mouth daily 04/02/16  Yes Leodis Binet, MD   prasugrel (EFFIENT) 10 MG TABS Take 1 tablet by mouth daily 04/02/16  Yes Leodis Binet, MD   triamcinolone (KENALOG) 0.1 % cream Apply to affected areas twice daily for up to 2 weeks or until improved. 03/23/16  Yes Francisca December, MD   ALPRAZolam Duanne Moron) 0.25 MG tablet Take 1 tablet by mouth 2 times daily as needed for Anxiety 03/23/16  Yes Harvel Quale, MD   montelukast (SINGULAIR) 10 MG tablet TAKE ONE TABLET BY MOUTH ONE TIME A DAY 03/18/16  Yes Jamal Collin, MD   traZODone (DESYREL) 100 MG tablet TAKE 1 TABLET BY MOUTH NIGHTLY 01/02/16  Yes Harvel Quale, MD   albuterol sulfate HFA (PROVENTIL HFA) 108 (90 BASE) MCG/ACT inhaler Inhale 2 puffs into the lungs every 6 hours as needed for Wheezing 12/27/15  Yes Harvel Quale, MD   Blood Pressure Monitor KIT Use daily as needed 12/20/15  Yes Harvel Quale, MD   hydrALAZINE (APRESOLINE) 25 MG tablet Take 1 tablet by mouth 3  times daily 11/12/15  Yes Harvel Quale, MD   ondansetron [facility]) 4 MG tablet Take 1 tablet by mouth daily as needed for Nausea or Vomiting 06/03/15  Yes Lucky Rathke Rayford Halsted, MD   venlafaxine (EFFEXOR XR) 150 MG XR capsule Take 1 capsule by mouth daily 06/03/15  Yes Harvel Quale, MD   Multiple Vitamins-Minerals (MULTIVITAMIN PO) Take by mouth   Yes Historical Provider, MD   CALCIUM-VITAMIN D PO Take by mouth   Yes Historical Provider, MD   cetirizine (ZYRTEC ALLERGY) 10 MG tablet Take 10 mg by mouth daily.   Yes Historical Provider, MD   traMADol (ULTRAM) 50 MG tablet Take 100 mg by mouth every 8 hours as needed for Pain    Historical Provider, MD        Allergies:  Morphine and Talwin [pentazocine]     Review of Systems:  A 10 point review of systems is negative except as noted in the history of present illness.      Physical Examination:    Vitals:    04/30/16 0826   BP: 122/80   Pulse: 64   SpO2: 97%        Constitutional and General Appearance: NAD   Respiratory:  ?? Normal excursion and expansion without use of accessory muscles  ?? Resp Auscultation: Normal breath sounds without dullness  Cardiovascular:  ?? The apical impulses not displaced  ?? JVD is normal  ?? The carotid upstroke is normal in amplitude and contour without delay or bruit  ?? Normal S1S2, No S3, No Murmur  ?? Peripheral pulses are symmetrical and full  ?? There is no clubbing, cyanosis of the extremities.  ?? Trace edema  ?? Pedal Pulses: 2+ and equal   Abdomen:  ?? No masses or tenderness  ?? Liver/Spleen: No Abnormalities Noted  Neurological/Psychiatric:  ?? Alert and oriented in all spheres  ?? Moves all extremities well  ?? Exhibits normal gait balance and coordination  ?? No abnormalities of mood, affect, memory, mentation, or behavior are noted    Cardiac Cath 03/31/16:  Anatomy:   LM-normal   LAD-70% mid with FFR 0.77  Cx-normal, codominant  OM1- normal  RCA-normal  RPDA- normal  LVEF- 70%, LVEDP 19   PCI: LAD 70% to 0% with  3.25 mm x 18 mm Xience Alpine drug-eluting stent     Hemodynamics:  RA-15/12 mean 9  RV- 60/0, 9  PAWP-20/26 mean 19  PA- 60/12 mean 33        Impression:  1.  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  2.  Hyperdynamic LV systolic function.  3.  Mildly decompensated diastolic CHF.  4.  Moderate pulmonary hypertension.     Plan:  1.  Aspirin indefinitely.  2.  Effient/equivalent for minimum of 1 year.  3.  IV diuresis while in the hospital and then discharged on Lasix 40 mg by mouth twice a day.  4.  Continue BiPAP therapy.  5.  Outpatient cardiac rehab.  6.  CHF service follow-up on discharge.      CTPA 03/25/16:  No evidence of pulmonary embolism or acute pulmonary abnormality.    Chest x-ray 03/25/16:  No acute process.    ECG 25-Mar-2016 17:28:21 [facility]:  Sinus bradycardia  Otherwise normal ECG  No significant change    CTPA 03/10/16:  No evidence of pulmonary embolus.   Small right and trace left pleural effusions, new as well as interlobular   septal wall thickening. ??Findings likely related to edema.   Mild mediastinal adenopathy, new. ??Findings may be reactive. ??Recommend   follow-up to ensure resolution.         Chest x-ray 02/13/16:  FINDINGS:   Cardiomegaly. ??There appears be pulmonary vascular congestion and   interstitial edema. ??No focal airspace disease.   ??     ??   Impression   Findings suggest congestive heart failure   ??     Echo 10/07/15:  Summary  ??Technically limited study due to body habitus.  ??Normal left ventricle size and systolic function with an estimated??ejection fraction of 55%. No regional wall motion abnormalities are seen.  ??There is mild concentric left ventricular hypertrophy.    Cardiac Cath 09/20/15:  Anatomy:   LM-normal  LAD-normal  Cx-normal  OM1- normal  RCA-normal, codominant  RPDA- normal  LVEF- 60%  ??  Impression:  1. Normal coronaries  2. Normal LV systolic function  3. False positive stress test      Myoview Stress Test   09/18/15:  Summary   ??-Moderate sized anterior mostly reversible defect consistent with ischemia   ??in the territory of the mid LAD .   ??-Normal LV function.   ??-Study is limited by breast attenuation.     Cardiac cath 06/03/12:  Normal coronaries and LVEF       Assessment:   1. Other chest pain    2. Shortness of breath    3. Coronary artery disease due to lipid rich plaque    4. Status post insertion of drug-eluting stent into left anterior descending (LAD) artery    5. Acute diastolic congestive heart failure ([place])    6. Essential hypertension    7. Hyperlipidemia, unspecified hyperlipidemia type        Plan:  Cardiac event monitor for continuous monitoring.  Increase lasix 40 mg daily to twice daily.  Increase potassium 20 meq to twice daily.  Follow up in 6 weeks to discuss results and reassess.  Labs before next visit cmp, cbc and fasting lipid.     Thank you for allowing me to participate in the care of this individual.    Stephannie Li. Susy [place], M.D., [facility]

## 2016-05-05 ENCOUNTER — Ambulatory Visit
Admit: 2016-05-05 | Discharge: 2016-05-05 | Payer: PRIVATE HEALTH INSURANCE | Attending: Family | Primary: Sports Medicine

## 2016-05-05 DIAGNOSIS — G4733 Obstructive sleep apnea (adult) (pediatric): Secondary | ICD-10-CM

## 2016-05-05 MED ORDER — NYSTATIN 100000 UNIT/GM EX CREA
100000 UNIT/GM | CUTANEOUS | 2 refills | Status: DC
Start: 2016-05-05 — End: 2017-02-04

## 2016-05-05 NOTE — Telephone Encounter (Signed)
Will add nystatin cream to try as well.

## 2016-05-05 NOTE — Progress Notes (Signed)
Magdalene River MD, FAASM, FCCP  Judithann Graves, MSN, RN, CNP [facility]  [address]  [address], [place]  [address]  [facility] [phone]   [place] [address]  F- [phone]       [facility]    Subjective:     Patient ID: Rachael Mays is a 60 y.o. female.    Chief Complaint   Patient presents with   ??? Sleep Apnea       HPI:      Bilevel settings: Auto mode IPAPmax-25 EPAPmin-11 PSmin-3 PSmax-6 Flex-3 Hum-5  Average P- 16.1/12.2 Comp 7.25hrs/night  Download AHI - 6.1 (down from 10.1)/hr  Mask- Wisp (S)  Machine download/SD card data reviewed and documented.    Epworth - Total score: 9    Follow-up :     Last Visit : February 2017      Co-morbidities are well controlled and stable per the patient at this time.     Subjective Health Changes: Carotid stenting     Follow-up :    Patient is compliant with the machine Yes    Feeling rested when using the machine  No, still feeling tired upon waking   Pressure is comfortable with inspiration and expiration Yes   Noticed changes in pressure did not change her resting patterns   Mask is fitting well, Yes   Noting air leak  No      Using trazodone- 174m nightly per PMD      Having painful Aerophagia No   Nocturia x 1 per night.   Having  HA No , Dry mouth Yes , Congestion No, or nose bleeds No per the patient.       Understands how to change humidification and/or tubing temperature for comfort while at home Yes .      Difficulties falling asleep No, or staying asleep No.   Approximate time to bed 1-3am and rising approximately 1-3pm   Taking no naps, usual length na   Drowsy when driving No  .      DOT/CDL: No   FAA/Pilots license:  No     Any concerns noted with the machine at this time  No.    Review of Systems   Constitutional: Positive for fatigue. Negative for appetite change, chills and fever.   HENT: Negative for congestion, nosebleeds, rhinorrhea  and sinus pressure.    Respiratory: Negative for apnea, cough and shortness of breath.    Cardiovascular: Negative for chest pain and palpitations.   Gastrointestinal: Negative for abdominal distention and abdominal pain.   Neurological: Negative for dizziness and headaches.       Social History     Social History   ??? Marital status: Married     Spouse name: N/A   ??? Number of children: N/A   ??? Years of education: N/A     Occupational History   ??? Not on file.     Social History Main Topics   ??? Smoking status: Former Smoker     Packs/day: 0.50     Years: 40.00     Types: Cigarettes     Quit date: 08/07/2012   ??? Smokeless tobacco: Never Used      Comment: started to smoke at age 60/ only smoked 0.5 p.p.d    ??? Alcohol use 0.0 oz/week     0 Standard drinks  or equivalent per week      Comment: occasionally    ??? Drug use: No   ??? Sexual activity: No     Other Topics Concern   ??? Not on file     Social History Narrative    ** Merged History Encounter **            Prior to Admission medications    Medication Sig Start Date End Date Taking? Authorizing Provider   potassium chloride (KLOR-CON M) 20 MEQ extended release tablet Take 1 tablet by mouth 2 times daily 04/30/16  Yes Leodis Binet, MD   furosemide (LASIX) 40 MG tablet Take 1 tablet by mouth 2 times daily 04/30/16  Yes Leodis Binet, MD   atorvastatin (LIPITOR) 80 MG tablet Take 1 tablet by mouth daily 04/30/16  Yes Leodis Binet, MD   lisinopril (PRINIVIL;ZESTRIL) 40 MG tablet Take 1 tablet by mouth daily Indications: Patient thought this was dc'd in December by PCP 04/09/16  Yes Harvel Quale, MD   omeprazole [facility]) 40 MG delayed release capsule Take 1 capsule by mouth daily 04/09/16  Yes Harvel Quale, MD   busPIRone (BUSPAR) 10 MG tablet Take 1 tablet by mouth 3 times daily 04/09/16  Yes Harvel Quale, MD   metoprolol (LOPRESSOR) 100 MG tablet TAKE TAKE 1 AND 1/2 TABLET BY MOUTH TABLETS BY MOUTH TWO TIMES A DAY 04/08/16  Yes Harvel Quale, MD   oxyCODONE-acetaminophen (PERCOCET) 5-325 MG per tablet Take 1 tablet by mouth every 6 hours as needed for Pain . 04/07/16  Yes Bhumit Patel, MD   nitroGLYCERIN (NITROSTAT) 0.4 MG SL tablet up to max of 3 total doses. If no relief after 1 dose, call 911. 04/06/16  Yes Bhumit Patel, MD   aspirin 81 MG EC tablet Take 1 tablet by mouth daily 04/02/16  Yes Leodis Binet, MD   prasugrel (EFFIENT) 10 MG TABS Take 1 tablet by mouth daily 04/02/16  Yes Leodis Binet, MD   traMADol (ULTRAM) 50 MG tablet Take 100 mg by mouth every 8 hours as needed for Pain   Yes Historical Provider, MD   triamcinolone (KENALOG) 0.1 % cream Apply to affected areas twice daily for up to 2 weeks or until improved. 03/23/16  Yes Francisca December, MD   ALPRAZolam Duanne Moron) 0.25 MG tablet Take 1 tablet by mouth 2 times daily as needed for Anxiety 03/23/16  Yes Harvel Quale, MD   montelukast (SINGULAIR) 10 MG tablet TAKE ONE TABLET BY MOUTH ONE TIME A DAY 03/18/16  Yes Jamal Collin, MD   traZODone (DESYREL) 100 MG tablet TAKE 1 TABLET BY MOUTH NIGHTLY 01/02/16  Yes Harvel Quale, MD   albuterol sulfate HFA (PROVENTIL HFA) 108 (90 BASE) MCG/ACT inhaler Inhale 2 puffs into the lungs every 6 hours as needed for Wheezing 12/27/15  Yes Harvel Quale, MD   Blood Pressure Monitor KIT Use daily as needed 12/20/15  Yes Harvel Quale, MD   hydrALAZINE (APRESOLINE) 25 MG tablet Take 1 tablet by mouth 3 times daily 11/12/15  Yes Harvel Quale, MD   ondansetron [facility]) 4 MG tablet Take 1 tablet by mouth daily as needed for Nausea or Vomiting 06/03/15  Yes Lucky Rathke Rayford Halsted, MD   venlafaxine (EFFEXOR XR) 150 MG XR capsule Take 1 capsule by mouth daily 06/03/15  Yes Harvel Quale, MD  Multiple Vitamins-Minerals (MULTIVITAMIN PO) Take by mouth   Yes Historical Provider, MD   CALCIUM-VITAMIN D PO Take by mouth   Yes Historical Provider, MD   cetirizine (ZYRTEC ALLERGY) 10 MG tablet Take  10 mg by mouth daily.   Yes Historical Provider, MD       Allergies as of 05/05/2016 - Review Complete 05/05/2016   Allergen Reaction Noted   ??? Morphine Itching 05/04/2014   ??? Talwin [pentazocine] Other (See Comments) 03/29/2013       Patient Active Problem List   Diagnosis   ??? Back pain   ??? Deep vein thrombosis ([place])   ??? Depression   ??? Hyperlipidemia   ??? Pulmonary embolism ([place])   ??? Nausea & vomiting   ??? Abdominal  pain, other specified site   ??? Morbid obesity due to excess calories ([place])   ??? Status post gastric banding   ??? Vertigo   ??? Dizziness   ??? Tinnitus, subjective   ??? Other complications of gastric band procedure   ??? Essential hypertension   ??? Hyperglycemia   ??? Atypical chest pain   ??? 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 Adrenal mass, left ([place])   ??? Adrenal nodule ([place])   ??? Obstructive sleep apnea (adult) (pediatric)   ??? Dyspnea and respiratory abnormalities   ??? Abnormal stress test   ??? Shortness of breath   ??? Acute diastolic congestive heart failure ([place])   ??? Coronary artery disease due to lipid rich plaque   ??? Pulmonary hypertension ([place])   ??? Unstable angina ([place])   ??? 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 Chest pain       Past Medical History:   Diagnosis Date   ??? Anxiety    ??? Asthma    ??? CAD (coronary artery disease)    ??? Chronic back pain    ??? Deep vein thrombosis    ??? Depression    ??? GERD (gastroesophageal reflux disease)     NO LONGER SINCE LAP   ??? GERD (gastroesophageal reflux disease) 01/23/2009   ??? Hx of blood clots    ??? Hyperlipidemia     hx; resolved with lap band   ??? Hypertension    ??? Obesity     hx of; had lap band   ??? Obstructive sleep apnea (adult) (pediatric) 09/18/2015   ??? Pulmonary embolism [facility])        Past Surgical History:   Procedure Laterality Date   ??? BACK SURGERY      neck  plates   ??? CESAREAN SECTION     ??? CHOLECYSTECTOMY     ??? COLONOSCOPY     ??? COLONOSCOPY  04/08/10   ??? CORONARY ANGIOPLASTY     ??? DIAGNOSTIC CARDIAC CATH LAB PROCEDURE      ??? HYSTERECTOMY     ??? LAP BAND  05/01/08    Dr. Nicole Cella   ??? OTHER SURGICAL HISTORY      [facility] Filter: curently in place   ??? OTHER SURGICAL HISTORY  07/05/13    lap band removal   ??? PARTIAL HYSTERECTOMY     ??? SHOULDER SURGERY     ??? UPPER GASTROINTESTINAL ENDOSCOPY  05/19/13   ??? UPPER GASTROINTESTINAL ENDOSCOPY  07/21/13    ESOPHAGEAL STENT PLACEMENT   ??? UPPER GASTROINTESTINAL ENDOSCOPY N/A 07/31/2014    Esophagogastroduodenoscopy with esophageal balloon dilation   ??? VASCULAR SURGERY  04/2015    Lucendia Herrlich, celiac artery  angiogram, normal abd arteries       Family History   Problem Relation Age of Onset   ??? Arthritis Mother    ??? Cancer Mother    ??? Depression Mother    ??? High Blood Pressure Mother    ??? Heart Disease Mother 66     MI    ??? Ovarian Cancer Mother 47   ??? Cancer Father 42     colon   ??? Heart Disease Maternal Grandmother    ??? Early Death Paternal Grandmother    ??? Heart Disease Paternal Grandfather    ??? Diabetes Other    ??? High Blood Pressure Other    ??? Obesity Other    ??? Other Sister      OSA   ??? Other Brother      OSA   ??? Other Brother      OSA   ??? Other Daughter      OSA       Vitals:  Weight BMI   Wt Readings from Last 3 Encounters:   05/05/16 (!) 305 lb 12.8 oz (138.7 kg)   04/30/16 (!) 304 lb 1.9 oz (137.9 kg)   04/09/16 (!) 304 lb 12.8 oz (138.3 kg)    Body mass index is 49.36 kg/(m^2).     BP HR SaO2   BP Readings from Last 3 Encounters:   05/05/16 118/70   04/30/16 122/80   04/09/16 120/78    Pulse Readings from Last 3 Encounters:   05/05/16 57   04/30/16 64   04/09/16 66    SpO2 Readings from Last 3 Encounters:   05/05/16 97%   04/30/16 97%   04/09/16 98%        Assessment:     1. Obstructive sleep apnea syndrome      Improving   2. Essential hypertension Stable    3. Uncomplicated asthma, unspecified asthma severity Stable    4. Depression, unspecified depression type Stable    5. Morbid obesity, unspecified obesity type ([place]) Stable    6. Chronic obstructive pulmonary disease, unspecified COPD type  ([place]) Stable        The chronic medical conditions listed are directly related to the primary diagnosis listed above.  The management of the primary diagnosis affects the secondary diagnosis and vice versa.  Plan:   - Reviewed compliance download with pt.    -Supplies and parts as needed for her machine, these are medically necessary.    - Patient using [facility] [employer] for supplies  -Continue medications per her PCP and other physicians.   -Limit caffeine use after 3pm.    -Encouraged her to work on weight loss through diet and exercise.  - Will see back to monitor AHI  - EPAP 10, PSmin 2, PSmax 4,   - Can call back in a week if not feeing any better with the pressure change   - Needs heated tubing for dry mouth  -F/U: 6 month.    No orders of the defined types were placed in this encounter.      No orders of the defined types were placed in this encounter.      No orders of the defined types were placed in this encounter.      Judithann Graves, MSN, RN, CNP

## 2016-05-05 NOTE — Telephone Encounter (Signed)
Patient calling and her rash is worse.  She is not sure if it is a reaction from triamcinolone (KENALOG) 0.1 % cream.  It is now in her groin area as well.  She can be reached at [phone]

## 2016-05-05 NOTE — Telephone Encounter (Signed)
Spoke to pt she states that the rash is spreading around her breasts and some on her abdomen, pt states it is not painful at all. Pt has been using TAC on it and it isn't helping. Pt states it flares when she is sweating.

## 2016-05-06 NOTE — Telephone Encounter (Signed)
Left message for pt that Dr. [name] sent in a rx for Nystatin cream to use as well

## 2016-05-12 ENCOUNTER — Ambulatory Visit
Admit: 2016-05-12 | Discharge: 2016-05-12 | Payer: PRIVATE HEALTH INSURANCE | Attending: Physician Assistant | Primary: Sports Medicine

## 2016-05-12 DIAGNOSIS — H6692 Otitis media, unspecified, left ear: Secondary | ICD-10-CM

## 2016-05-12 MED ORDER — NEOMYCIN-POLYMYXIN-HC 3.5-10000-1 OT SOLN
3.5-10000-1 | Freq: Three times a day (TID) | OTIC | 0 refills | Status: AC
Start: 2016-05-12 — End: 2016-05-22

## 2016-05-12 MED ORDER — AMOXICILLIN-POT CLAVULANATE 875-125 MG PO TABS
875-125 MG | ORAL_TABLET | Freq: Two times a day (BID) | ORAL | 0 refills | Status: DC
Start: 2016-05-12 — End: 2016-05-21

## 2016-05-12 NOTE — Progress Notes (Signed)
Subjective:      Patient ID: Rachael Mays is a 60 y.o. female.    HPI  Patient is here with a 5-7 day history of left ear pain, internally, feels pressure and swelling and drainage. No fever, body aches or chills.     Review of Systems   Constitutional: Negative for appetite change, chills and fever.   HENT: Positive for ear discharge, ear pain and hearing loss.    Skin: Positive for rash (left ear).   Neurological: Negative for dizziness and headaches.   Hematological: Positive for adenopathy.       Objective:   Physical Exam   Constitutional: She is oriented to person, place, and time. Vital signs are normal. She appears well-developed and well-nourished. She is cooperative.   HENT:   Head: Normocephalic.   Right Ear: Tympanic membrane and ear canal normal.   Left ear canal with erythema, edema and some purulent drainage, TM not visible due to pain when advancing the speculum   Lymphadenopathy:        Head (left side): Posterior auricular adenopathy present.   Neurological: She is alert and oriented to person, place, and time.       Assessment:      Rachael Mays was seen today for abscess.    Diagnoses and all orders for this visit:    Left otitis media, unspecified chronicity, unspecified otitis media type  -     amoxicillin-clavulanate (AUGMENTIN) 875-125 MG per tablet; Take 1 tablet by mouth 2 times daily for 10 days    Acute otitis externa of left ear, unspecified type  -     neomycin-polymyxin-hydrocortisone (CORTISPORIN) 3.5-10000-1 otic solution; Place 3 drops in ear(s) 3 times daily for 10 days             Plan:      Call if not resolving.

## 2016-05-12 NOTE — Progress Notes (Signed)
I have reviewed the history and physical note and findings.

## 2016-05-12 NOTE — Patient Instructions (Signed)
Rachael Mays was seen today for abscess.    Diagnoses and all orders for this visit:    Left otitis media, unspecified chronicity, unspecified otitis media type  -     amoxicillin-clavulanate (AUGMENTIN) 875-125 MG per tablet; Take 1 tablet by mouth 2 times daily for 10 days    Acute otitis externa of left ear, unspecified type  -     neomycin-polymyxin-hydrocortisone (CORTISPORIN) 3.5-10000-1 otic solution; Place 3 drops in ear(s) 3 times daily for 10 days       Call with any concerns.

## 2016-05-14 LAB — URINE DRUG SCREEN
Amphetamine Screen, Urine: NEGATIVE
Barbiturate Screen, Ur: NEGATIVE (ref <200)
Benzodiazepine Screen, Urine: POSITIVE — AB (ref <200)
Cannabinoid Scrn, Ur: NEGATIVE (ref <50)
Cocaine Metabolite Screen, Urine: NEGATIVE (ref <300)
Methadone Screen, Urine: NEGATIVE (ref <300)
Opiate Scrn, Ur: NEGATIVE (ref <300)
Oxycodone Urine: POSITIVE — AB (ref <100)
PCP Screen, Urine: NEGATIVE (ref <25)
Propoxyphene Scrn, Ur: NEGATIVE (ref <300)
pH, UA: 6

## 2016-05-18 NOTE — Telephone Encounter (Signed)
See previous documentation

## 2016-05-18 NOTE — Telephone Encounter (Signed)
Spoke with pt see notes

## 2016-05-18 NOTE — Telephone Encounter (Signed)
Diflucan 150mg , take 1 po daily x 1, #1, NR    Refer to ENT

## 2016-05-18 NOTE — Telephone Encounter (Signed)
Spoke with pt to advise her that  Dr. Posey Pronto wants pt to cut back her trazadone dose to 50 mg. P Pt agrees and will call if sleepiness does not resolve.

## 2016-05-18 NOTE — Telephone Encounter (Signed)
Patient advised.  Gave Dr. Marlane Hatcher number.  She said since she called her symptoms for the yeast infection have gotten somewhat better. . Is using OTC monistat.Marland Kitchen She will continue this for now.  I advised her to call and let us know if it does not continue to get better.

## 2016-05-18 NOTE — Telephone Encounter (Signed)
Patient's pressure was recently adjusted and she does not feel a difference. Patient states it makes her sleep more and she is requesting an adjustment.

## 2016-05-18 NOTE — Telephone Encounter (Signed)
Patient was in 6/6 for ear problems saw Alexis Frock was prescribed antibiotic and drops.  Calling today because it is worse and now in both ears.  And she has also got a yeast infection from the antibiotics.  Can someone please call and advise her what she needs to do.

## 2016-05-18 NOTE — Telephone Encounter (Signed)
Please advise

## 2016-05-20 ENCOUNTER — Encounter: Payer: PRIVATE HEALTH INSURANCE | Attending: Audiologist | Primary: Sports Medicine

## 2016-05-20 ENCOUNTER — Ambulatory Visit
Admit: 2016-05-20 | Discharge: 2016-05-20 | Payer: PRIVATE HEALTH INSURANCE | Attending: Plastic Surgery within the Head & Neck | Primary: Sports Medicine

## 2016-05-20 DIAGNOSIS — H60513 Acute actinic otitis externa, bilateral: Secondary | ICD-10-CM

## 2016-05-20 MED ORDER — CIPROFLOXACIN HCL 500 MG PO TABS
500 MG | ORAL_TABLET | Freq: Two times a day (BID) | ORAL | 0 refills | Status: DC
Start: 2016-05-20 — End: 2016-06-16

## 2016-05-20 MED ORDER — HYDROCORTISONE-ACETIC ACID 1-2 % OT SOLN
1-2 % | Freq: Three times a day (TID) | OTIC | 1 refills | Status: DC
Start: 2016-05-20 — End: 2016-06-16

## 2016-05-20 NOTE — Telephone Encounter (Signed)
Patient needs to be told that she can pick up the papers she dropped off last week and that her Oncology doctor needs to fill that out.      LMOVM for patient to call back.

## 2016-05-20 NOTE — Progress Notes (Signed)
Subjective:      Patient ID: Rachael Mays is a 60 y.o. female.  Last Monday the patient noted left-sided earache which was treated with Augmentin as well as eardrops.  However, since that time, she has noticed some decrease in hearing with some stuffiness.  She denied any sinus complaints and has had no fever.  She had some similar episode in the past in terms of tinnitus but none recently.  She has had some slight lightheadedness but no true vertigo.  There is been no smoking  HPI    Review of Systems   Constitutional: Negative.  Negative for fever.   HENT: Positive for ear pain, hearing loss and tinnitus. Negative for ear discharge, rhinorrhea, sinus pressure, sore throat and voice change.    Eyes: Negative.    Respiratory: Negative.    Cardiovascular: Negative.    Endocrine: Negative.    Skin: Negative.    Allergic/Immunologic: Negative.    Neurological: Positive for dizziness.   Hematological: Negative.    Psychiatric/Behavioral: Negative.        Objective:   Physical Exam   Constitutional: She is oriented to person, place, and time. She appears well-developed and well-nourished.   HENT:   Head: Normocephalic and atraumatic.   Nose: Nose normal.   Mouth/Throat: Oropharynx is clear and moist. No oropharyngeal exudate.   Indirect laryngoscopy is unremarkable.  Amylase on the left reveals significant inflammation of the ear canal skin with evidence of  infection.  The tympanic membrane appears to be entirely normal, however.  The right there is some mild inflammation of the external canal as well.  The tympanic membrane is also normal.   Eyes: Conjunctivae are normal. Pupils are equal, round, and reactive to light.   No nystagmus   Neck: Normal range of motion. Neck supple. No tracheal deviation present. No thyromegaly present.   Cardiovascular: Normal rate and regular rhythm.    Pulmonary/Chest: Effort normal.   Lymphadenopathy:     She has no cervical adenopathy.   Neurological: She is alert and oriented to  person, place, and time.   Skin: Skin is warm and dry.   Psychiatric: She has a normal mood and affect. Her behavior is normal. Judgment and thought content normal.       Assessment:      Findings are those of an acute external otitis primarily on the left side but also somewhat on the right.      Plan:      I have cleaned the ear canal skin with peroxide.  We'll treat this with the Acetasol HC otic drops as well as a course of Cipro by mouth.  I've asked that she contact me in 1 week to determine her complete resolution.

## 2016-05-20 NOTE — Telephone Encounter (Signed)
Spoke to the pt. She will see Chryl Heck., CNP, 05/20/16, and have her look at the paper work.

## 2016-05-21 ENCOUNTER — Inpatient Hospital Stay: Attending: Cardiovascular Disease | Primary: Sports Medicine

## 2016-05-21 ENCOUNTER — Ambulatory Visit
Admit: 2016-05-21 | Discharge: 2016-05-21 | Payer: PRIVATE HEALTH INSURANCE | Attending: Acute Care | Primary: Sports Medicine

## 2016-05-21 DIAGNOSIS — R0602 Shortness of breath: Secondary | ICD-10-CM

## 2016-05-21 DIAGNOSIS — I272 Pulmonary hypertension, unspecified: Secondary | ICD-10-CM

## 2016-05-21 LAB — CBC WITH AUTO DIFFERENTIAL
Basophils %: 0.5 %
Basophils Absolute: 0 10*3/uL (ref 0.0–0.2)
Eosinophils %: 5.2 %
Eosinophils Absolute: 0.4 10*3/uL (ref 0.0–0.6)
Hematocrit: 41.1 % (ref 36.0–48.0)
Hemoglobin: 13.2 g/dL (ref 12.0–16.0)
Lymphocytes %: 35.4 %
Lymphocytes Absolute: 2.6 10*3/uL (ref 1.0–5.1)
MCH: 27.2 pg (ref 26.0–34.0)
MCHC: 32.2 g/dL (ref 31.0–36.0)
MCV: 84.6 fL (ref 80.0–100.0)
MPV: 8.9 fL (ref 5.0–10.5)
Monocytes %: 8.7 %
Monocytes Absolute: 0.6 10*3/uL (ref 0.0–1.3)
Neutrophils %: 50.2 %
Neutrophils Absolute: 3.7 10*3/uL (ref 1.7–7.7)
Platelets: 187 10*3/uL (ref 135–450)
RBC: 4.86 M/uL (ref 4.00–5.20)
RDW: 14.1 % (ref 12.4–15.4)
WBC: 7.3 10*3/uL (ref 4.0–11.0)

## 2016-05-21 LAB — LIPID PANEL
Cholesterol, Total: 127 mg/dL (ref 0–199)
HDL: 42 mg/dL (ref 40–60)
LDL Calculated: 65 mg/dL (ref <100)
Triglycerides: 100 mg/dL (ref 0–150)
VLDL Cholesterol Calculated: 20 mg/dL

## 2016-05-21 LAB — COMPREHENSIVE METABOLIC PANEL
ALT: 26 U/L (ref 10–40)
AST: 30 U/L (ref 15–37)
Albumin/Globulin Ratio: 1.2 (ref 1.1–2.2)
Albumin: 3.9 g/dL (ref 3.4–5.0)
Alkaline Phosphatase: 114 U/L (ref 40–129)
Anion Gap: 14 (ref 3–16)
BUN: 10 mg/dL (ref 7–20)
CO2: 27 mmol/L (ref 21–32)
Calcium: 9.6 mg/dL (ref 8.3–10.6)
Chloride: 101 mmol/L (ref 99–110)
Creatinine: 0.9 mg/dL (ref 0.6–1.1)
GFR African American: >60 (ref >60)
GFR Non-African American: >60 (ref >60)
Globulin: 3.3 g/dL
Glucose: 107 mg/dL — ABNORMAL HIGH (ref 70–99)
Potassium: 4.8 mmol/L (ref 3.5–5.1)
Sodium: 142 mmol/L (ref 136–145)
Total Bilirubin: 0.4 mg/dL (ref 0.0–1.0)
Total Protein: 7.2 g/dL (ref 6.4–8.2)

## 2016-05-21 MED ORDER — FUROSEMIDE 40 MG PO TABS
40 | ORAL_TABLET | ORAL | 3 refills | Status: DC
Start: 2016-05-21 — End: 2016-07-27

## 2016-05-21 MED ORDER — SPIRONOLACTONE 25 MG PO TABS
25 MG | ORAL_TABLET | Freq: Every day | ORAL | 0 refills | Status: DC
Start: 2016-05-21 — End: 2016-06-17

## 2016-05-21 MED ORDER — POTASSIUM CHLORIDE CRYS ER 20 MEQ PO TBCR
20 | ORAL_TABLET | Freq: Every day | ORAL | 3 refills | Status: DC
Start: 2016-05-21 — End: 2016-06-16

## 2016-05-21 NOTE — Progress Notes (Addendum)
[facility] - Cardiology      Chief Complaint: "shortness of breath and chest pain"    Chief Complaint   Patient presents with   ??? Follow-Up from [facility]     04/05/16   ??? Coronary Artery Disease   ??? Hypertension   ??? Congestive Heart Failure   ??? Hyperlipidemia       History of present illness: Rachael Mays is a 60 y.o. 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 She has been experiencing chronic chest pain and shortness of breath since April 2017. She underwent LHC that revealed significant stenosis of the LAD and a DES was placed. RHC was also performed which showed PA mean 33 mmHg, PCWP 18 mmHg (transpulmonary gradient 14), PVR 2.134 Wood units.  Symptoms have not improved post stent. She has been seen by Dr Jeb Levering and CTA/CTPA (03/2016) with no evidence of PE or acute abnormality. CXR (02/2016) suggestive of CHF and her diuretics were increased. Echo 04/06/16 with concentric LVH, EF 65% and RVSP 42 mmHg. Patient has been referred to HF clinic for further management. She is on lisinopril at 40 mg daily and Lasix 40 mg BID. Last labs 04/06/2016 with stable renal function and potassium.     Patient presents today for follow up dCHF and PHTN. She continues to experience shortness of breath with minimal activity even ADLs (showering, walking room to room in home). She has "constant" chest discomfort/pressure described as a sensation of "drowning." Denies orthopnea or PND but on bipap at night (since Jan this year).        Rachael Mays describes symptoms including chest pain, dyspnea, fatigue, palpitations, early satiety, edema but denies orthopnea, PND, syncope. Patient reports adherence with medication regimen. She follows a no added salt diet and estimates she drinks about 48 ounces a day. Weight appears stable.        Past Medical History:   Diagnosis Date   ??? Anxiety    ??? Asthma    ??? CAD  (coronary artery disease)    ??? Chronic back pain    ??? Deep vein thrombosis    ??? Depression    ??? GERD (gastroesophageal reflux disease)     NO LONGER SINCE LAP   ??? GERD (gastroesophageal reflux disease) 01/23/2009   ??? Hx of blood clots    ??? Hyperlipidemia     hx; resolved with lap band   ??? Hypertension    ??? Obesity     hx of; had lap band   ??? Obstructive sleep apnea (adult) (pediatric) 09/18/2015   ??? Pulmonary embolism [facility])      Past Surgical History:   Procedure Laterality Date   ??? BACK SURGERY      neck  plates   ??? CESAREAN SECTION     ??? CHOLECYSTECTOMY     ??? COLONOSCOPY     ??? COLONOSCOPY  04/08/10   ??? CORONARY ANGIOPLASTY     ??? DIAGNOSTIC CARDIAC CATH LAB PROCEDURE     ??? HYSTERECTOMY     ??? LAP BAND  05/01/08    Dr. Nicole Cella   ??? OTHER SURGICAL HISTORY      Greenfield Filter: curently in place   ??? OTHER SURGICAL HISTORY  07/05/13    lap band removal   ??? PARTIAL HYSTERECTOMY     ??? SHOULDER SURGERY     ??? UPPER GASTROINTESTINAL ENDOSCOPY  05/19/13   ???  UPPER GASTROINTESTINAL ENDOSCOPY  07/21/13    ESOPHAGEAL STENT PLACEMENT   ??? UPPER GASTROINTESTINAL ENDOSCOPY N/A 07/31/2014    Esophagogastroduodenoscopy with esophageal balloon dilation   ??? VASCULAR SURGERY  04/2015    Lucendia Herrlich, celiac artery angiogram, normal abd arteries     Social History   Substance Use Topics   ??? Smoking status: Former Smoker     Packs/day: 0.50     Years: 40.00     Types: Cigarettes     Quit date: 08/07/2012   ??? Smokeless tobacco: Never Used      Comment: started to smoke at age 28 / only smoked 0.5 p.p.d    ??? Alcohol use 0.0 oz/week     0 Standard drinks or equivalent per week      Comment: occasionally      Review of Systems:   Constitutional: No significant change in weight, + fatigue   HEENT: No change in vision or ringing in the ears.  Respiratory: + DOE, no PND, orthopnea or cough.   Cardiovascular: See HPI + chest pressure/congestion, + edema, + palpitations (event monitor)  GI: No n/v, abdominal pain or changes in bowel habits.  No melena, no  hematochezia  GU: No dysuria or hematuria.  Skin: No rash or new skin lesions.  Musculoskeletal: No new muscle or joint pain.  Neurological: No lightheadedness, dizziness, syncope or TIA-like symptoms.  Psychiatric: No anxiety, insomnia or depression    Physical Exam:  BP 130/72 (Site: Left Arm, Position: Sitting, Cuff Size: Large Adult)   Pulse 58   Resp 16   Ht '5\' 6"'  (1.676 m)   Wt (!) 305 lb 12.8 oz (138.7 kg)   SpO2 95%   BMI 49.36 kg/m2    General:  Awake, alert, oriented in NAD  Skin:  Warm and dry.  No unusual bruising or rash  HEENT: Normocephalic and atraumatic. Oral mucosa moist, no cyanosis.  Neck:  Supple.  No JVP appreciated but difficult to assess  Chest:  Normal effort.  Clear to auscultation  Cardiovascular:  RRR, S1/S2, no murmur/gallop/rub  Abdomen:  Soft, nontender, +bowel sounds  Extremities:  1+ BLE edema  Neurological: No focal deficits  Psychological: Normal mood and affect      Current Outpatient Prescriptions   Medication Sig Dispense Refill   ??? acetic acid-hydrocortisone (VOSOL-HC) 1-2 % otic solution Place 3 drops into both ears 3 times daily 15 mL 1   ??? ciprofloxacin (CIPRO) 500 MG tablet Take 1 tablet by mouth 2 times daily 14 tablet 0   ??? amoxicillin-clavulanate (AUGMENTIN) 875-125 MG per tablet Take 1 tablet by mouth 2 times daily for 10 days 20 tablet 0   ??? neomycin-polymyxin-hydrocortisone (CORTISPORIN) 3.5-10000-1 otic solution Place 3 drops in ear(s) 3 times daily for 10 days 1 each 0   ??? nystatin (MYCOSTATIN) 100000 UNIT/GM cream Apply topically 2 times daily. 30 g 2   ??? potassium chloride (KLOR-CON M) 20 MEQ extended release tablet Take 1 tablet by mouth 2 times daily 180 tablet 3   ??? furosemide (LASIX) 40 MG tablet Take 1 tablet by mouth 2 times daily 180 tablet 3   ??? atorvastatin (LIPITOR) 80 MG tablet Take 1 tablet by mouth daily 90 tablet 3   ??? lisinopril (PRINIVIL;ZESTRIL) 40 MG tablet Take 1 tablet by mouth daily Indications: Patient thought this was dc'd in December by PCP  90 tablet 3   ??? omeprazole (PRILOSEC) 40 MG delayed release capsule Take 1 capsule by mouth daily 90  capsule 3   ??? busPIRone (BUSPAR) 10 MG tablet Take 1 tablet by mouth 3 times daily 270 tablet 3   ??? metoprolol (LOPRESSOR) 100 MG tablet TAKE TAKE 1 AND 1/2 TABLET BY MOUTH TABLETS BY MOUTH TWO TIMES A DAY 270 tablet 1   ??? oxyCODONE-acetaminophen (PERCOCET) 5-325 MG per tablet Take 1 tablet by mouth every 6 hours as needed for Pain . 40 tablet 0   ??? nitroGLYCERIN (NITROSTAT) 0.4 MG SL tablet up to max of 3 total doses. If no relief after 1 dose, call 911. 25 tablet 3   ??? aspirin 81 MG EC tablet Take 1 tablet by mouth daily 30 tablet 11   ??? prasugrel (EFFIENT) 10 MG TABS Take 1 tablet by mouth daily 90 tablet 3   ??? traMADol (ULTRAM) 50 MG tablet Take 100 mg by mouth every 8 hours as needed for Pain     ??? triamcinolone (KENALOG) 0.1 % cream Apply to affected areas twice daily for up to 2 weeks or until improved. 80 g 1   ??? ALPRAZolam (XANAX) 0.25 MG tablet Take 1 tablet by mouth 2 times daily as needed for Anxiety 60 tablet 0   ??? montelukast (SINGULAIR) 10 MG tablet TAKE ONE TABLET BY MOUTH ONE TIME A DAY 90 tablet 1   ??? traZODone (DESYREL) 100 MG tablet TAKE 1 TABLET BY MOUTH NIGHTLY 90 tablet 1   ??? albuterol sulfate HFA (PROVENTIL HFA) 108 (90 BASE) MCG/ACT inhaler Inhale 2 puffs into the lungs every 6 hours as needed for Wheezing 3 Inhaler 3   ??? Blood Pressure Monitor KIT Use daily as needed 1 kit 0   ??? hydrALAZINE (APRESOLINE) 25 MG tablet Take 1 tablet by mouth 3 times daily 270 tablet 3   ??? ondansetron (ZOFRAN) 4 MG tablet Take 1 tablet by mouth daily as needed for Nausea or Vomiting 30 tablet 1   ??? venlafaxine (EFFEXOR XR) 150 MG XR capsule Take 1 capsule by mouth daily 90 capsule 3   ??? Multiple Vitamins-Minerals (MULTIVITAMIN PO) Take by mouth     ??? CALCIUM-VITAMIN D PO Take by mouth     ??? cetirizine (ZYRTEC ALLERGY) 10 MG tablet Take 10 mg by mouth daily.       No current facility-administered medications for  this visit.        Labs:   Lab Results   Component Value Date    WBC 8.1 04/04/2016    HGB 13.7 04/04/2016    HCT 41.8 04/04/2016    MCV 86.6 04/04/2016    PLT 184 04/04/2016     Lab Results   Component Value Date    NA 141 04/06/2016    K 4.0 04/06/2016    CL 103 04/06/2016    CO2 27 04/06/2016    BUN 20 04/06/2016    CREATININE 0.9 04/06/2016    GLUCOSE 135 04/06/2016    CALCIUM 8.7 04/06/2016      Lab Results   Component Value Date    TRIG 137 05/04/2014    HDL 49 05/04/2014    HDL 52 03/31/2011    LDLCALC 132 05/04/2014    LABVLDL 27 05/04/2014       Diagnostics:    Echo 04/06/16:  Summary   Normal left ventricle size and systolic function with an estimated ejection   fraction of 65%.   There is mild concentric left ventricular hypertrophy.   Mild mitral regurgitation is present.   The aortic valve appears sclerotic but  opens well.   There is mild tricuspid regurgitation with RVSP estimated at 42 mmHg.   Mild pulmonic regurgitation present.   Definity was used to better delineate endocardium.     R/L Cardiac Cath 03/31/16:  Anatomy:   LM-normal   LAD-70% mid with FFR 0.77  Cx-normal, codominant  OM1- normal  RCA-normal  RPDA- normal  LVEF- 70%, LVEDP 19   PCI: LAD 70% to 0% with 3.25 mm x 18 mm Xience Alpine drug-eluting stent     Hemodynamics:  RA-15/12 mean 9  RV- 60/0, 9  PAWP-20/26 mean 19  PA- 60/12 mean 33        Impression:  1.  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  2.  Hyperdynamic LV systolic function.  3.  Mildly decompensated diastolic CHF.  4.  Moderate pulmonary hypertension.        Assessment:  1. Pulmonary HTN. RVSP 42 mmHg on echo, mean PA pressure 33 mmHg on RHC. Characteristic of left heart disease with wedge pressure 18 mmHg.     2.  Shortness of breath. Likely multifactorial: dCHF, COPD, possible obesity related hypoventilation, deconditioning.   3. Essential hypertension. Controlled.    4. CAD. Stable. On DAPT, statin and beta blocker therapy.        Plan:  1. Increase Lasix to 80 mg in the am (2 pills) and 40 mg in pm (1 pill).  2. Start aldactone (spironolactone) 1/2 tablet daily.  3. Decrease potassium to once a day when you start aldactone (spironolactone).  4. Check labs today (BMP/CBC/lipids) and in two weeks (BMP/BNP).  5. Limit fluids to under 64 ounces a day.  6. Continue no added salt diet.  7. Follow up in 4 weeks with Dr Jacqualyn Posey on July 18th, earlier for worsening.    Thank you for allowing me to participate in the care of your patient.    Jacqualine Code, CNP        QUALITY MEASURES  1. Tobacco Cessation Counseling: N/A  2. BP retake if >140/90: yes  3. Communication to PCP: Office note forwarded/faxed to PCP  4. CAD antiplatelet therapy: yes  5. CAD lipid lowering therapy: yes  6. HF A. Fib on anticoagulation: N/A

## 2016-05-21 NOTE — Patient Instructions (Addendum)
1. Increase Lasix to 80 mg in the am (2 pills) and 40 mg in pm (1 pill).    2. Start aldactone (spironolactone) 1/2 tablet daily.    3. Decrease potassium to once a day when you start aldactone (spironolactone).    4. Check labs today and in two weeks (kidney and potassium).    5. Limit fluids to under 64 ounces a day.    6. Continue no added salt.    7. Follow up in 4 weeks with Dr Jacqualyn Posey on July 18th.

## 2016-05-28 NOTE — Telephone Encounter (Signed)
I gave patient her lab results. She cannot get into her MyChart for some reason. So I let her know PER [facility] that her glucose is minimally high and it improve that she can watch her sweets intake. I let her know otherwise her labs look good. Patient gave a verbal transferred her to [facility] to make an appointment. Patient also asked for her paperwork and if she is going to get that back. I let her know that she was supposed to get that back at her OV with [facility]. She didn't get it back then I let her know that I will look for it.

## 2016-05-28 NOTE — Telephone Encounter (Signed)
I called patient to make sure she got her recent lab results from [facility] Left the office number if she has not gotten them.

## 2016-06-04 ENCOUNTER — Telehealth

## 2016-06-04 NOTE — Telephone Encounter (Signed)
CardioNet montior released.

## 2016-06-04 NOTE — Telephone Encounter (Signed)
I lmovm for patient to give [place] a call back for results PER [facility]IPPA.     PER [facility] patient needs to be told that Turner monitor showed predominantly normal rhythm with occasional premature beats.

## 2016-06-05 ENCOUNTER — Telehealth

## 2016-06-05 MED ORDER — PREDNISONE 10 MG PO TABS
10 MG | ORAL_TABLET | ORAL | 0 refills | Status: DC
Start: 2016-06-05 — End: 2016-06-16

## 2016-06-05 NOTE — Telephone Encounter (Signed)
Ears feels stuffy

## 2016-06-05 NOTE — Telephone Encounter (Signed)
Patient notified

## 2016-06-05 NOTE — Telephone Encounter (Signed)
Pt was here 3 weeks ago, got better, but when she blows nose she can still feel fluid in ear and gets sharp pains and feels stopped up again.  Please advise

## 2016-06-10 ENCOUNTER — Encounter

## 2016-06-10 MED ORDER — VENLAFAXINE HCL ER 150 MG PO CP24
150 MG | ORAL_CAPSULE | ORAL | 1 refills | Status: DC
Start: 2016-06-10 — End: 2016-07-27

## 2016-06-11 ENCOUNTER — Encounter: Attending: Cardiovascular Disease | Primary: Sports Medicine

## 2016-06-16 ENCOUNTER — Inpatient Hospital Stay
Admit: 2016-06-17 | Discharge: 2016-06-18 | Payer: PRIVATE HEALTH INSURANCE | Source: Home / Self Care | Admitting: Emergency Medicine

## 2016-06-16 ENCOUNTER — Encounter: Admit: 2016-06-16 | Primary: Sports Medicine

## 2016-06-16 DIAGNOSIS — R079 Chest pain, unspecified: Secondary | ICD-10-CM

## 2016-06-16 LAB — COMPREHENSIVE METABOLIC PANEL
ALT: 41 U/L — ABNORMAL HIGH (ref 10–40)
AST: 27 U/L (ref 15–37)
Albumin/Globulin Ratio: 1.3 (ref 1.1–2.2)
Albumin: 4 g/dL (ref 3.4–5.0)
Alkaline Phosphatase: 117 U/L (ref 40–129)
Anion Gap: 11 (ref 3–16)
BUN: 13 mg/dL (ref 7–20)
CO2: 28 mmol/L (ref 21–32)
Calcium: 9.3 mg/dL (ref 8.3–10.6)
Chloride: 102 mmol/L (ref 99–110)
Creatinine: 0.9 mg/dL (ref 0.6–1.1)
GFR African American: >60 (ref >60)
GFR Non-African American: >60 (ref >60)
Globulin: 3.2 g/dL
Glucose: 133 mg/dL — ABNORMAL HIGH (ref 70–99)
Potassium: 3.9 mmol/L (ref 3.5–5.1)
Sodium: 141 mmol/L (ref 136–145)
Total Bilirubin: 0.7 mg/dL (ref 0.0–1.0)
Total Protein: 7.2 g/dL (ref 6.4–8.2)

## 2016-06-16 LAB — CBC
Hematocrit: 43.5 % (ref 36.0–48.0)
Hemoglobin: 14.3 g/dL (ref 12.0–16.0)
MCH: 27.6 pg (ref 26.0–34.0)
MCHC: 32.8 g/dL (ref 31.0–36.0)
MCV: 84.2 fL (ref 80.0–100.0)
MPV: 9.2 fL (ref 5.0–10.5)
Platelets: 177 10*3/uL (ref 135–450)
RBC: 5.17 M/uL (ref 4.00–5.20)
RDW: 15.2 % (ref 12.4–15.4)
WBC: 8.9 10*3/uL (ref 4.0–11.0)

## 2016-06-16 LAB — PROTIME-INR
INR: 0.97 (ref 0.85–1.15)
Protime: 11 s (ref 9.6–13.0)

## 2016-06-16 LAB — BRAIN NATRIURETIC PEPTIDE: Pro-BNP: 216 pg/mL — ABNORMAL HIGH (ref 0–124)

## 2016-06-16 LAB — TROPONIN: Troponin: <0.01 ng/mL (ref <0.01)

## 2016-06-16 MED ORDER — IOPAMIDOL 76 % IV SOLN
76 % | Freq: Once | INTRAVENOUS | Status: AC | PRN
Start: 2016-06-16 — End: 2016-06-16
  Administered 2016-06-16: 23:00:00 100 mL via INTRAVENOUS

## 2016-06-16 MED ORDER — HYDROMORPHONE 0.5MG/0.5ML IJ SOLN
1 MG/ML | Freq: Once | Status: AC
Start: 2016-06-16 — End: 2016-06-16
  Administered 2016-06-16: 0.5 mg via INTRAVENOUS

## 2016-06-16 MED ORDER — NITROGLYCERIN 2 % TD OINT
2 % | Freq: Once | TRANSDERMAL | Status: AC
Start: 2016-06-16 — End: 2016-06-16
  Administered 2016-06-16: 23:00:00 1 [in_us] via TOPICAL

## 2016-06-16 MED FILL — NITRO-BID 2 % TD OINT: 2 % | TRANSDERMAL | Qty: 1

## 2016-06-16 MED FILL — ISOVUE-370 76 % IV SOLN: 76 % | INTRAVENOUS | Qty: 100

## 2016-06-16 MED FILL — HYDROMORPHONE HCL 1 MG/ML IJ SOLN: 1 MG/ML | INTRAMUSCULAR | Qty: 0.5

## 2016-06-16 NOTE — ED Notes (Signed)
Bed: 23-01  Expected date:   Expected time:   Means of arrival:   Comments:  Triage       Earlie Lou, RN  06/16/16 Bosie Helper

## 2016-06-16 NOTE — Progress Notes (Signed)
Admission assessment completed. Awake and alert. Monitor shows SR/SB. Lungs CTA. No edema noted. C/O level 6 mid chest and left shoulder pain. Describes it as an aching. Also c/o nausea. Family with pt. Call light in reach.

## 2016-06-16 NOTE — ED Provider Notes (Addendum)
Physician Supervisory Note    I saw this patient in triage for their initial evaluation and also I independently performed a history and physical on Rachael Mays.   All diagnostic, treatment, and disposition decisions were made by myself in conjunction with the advanced practice provider.  I have participated in the medical decision making and directed the treatment plan and disposition of the patient.    In brief, Rachael Mays is a 60 y.o. female who presents to the ED complaining of symptoms of substernal chest pains since last night going to the left shoulder, described as an achy sensation.  Nauseated as well.  SL NTG helped a little though.  No fevers objectively but has been coughing, feels short of breath.  Feels like cardiac pain, follows with Dr. Susy Manor, was also at her pain specialist Dr. Diana Eves at the time.  Reports bilateral leg swelling.  Took aspirin already today.    BP (!) 149/61   Pulse 59   Temp 98.3 ??F (36.8 ??C)   Resp 12   Ht 5\' 6"  (1.676 m)   Wt (!) 303 lb (137.4 kg)   SpO2 96%   BMI 48.91 kg/m2  Focused physical examination notable for no acute distress, well-appearing, well-nourished, normal speech and mentation without obvious facial droop, no obvious rash.  No obvious cranial nerve deficits on my initial exam.  Regular rate and rhythm, clear to auscultation bilaterally, trace bipedal edema.  Obese.     The 12 lead EKG was interpreted by me as follows:  Rate: bradycardia with a rate of 58  Rhythm: sinus  Axis: normal  Intervals: normal PR, narrow QRS, normal QTc  ST segments: no ST elevations or depressions  T waves: no abnormal inversions  Non-specific T wave changes: not present  Prior EKG comparison: EKG dated 04/06/16 is not significantly different    MDM:  ED course was notable for chest discomfort concerning for possible cardiac etiology based in her history and comorbidities.  Although she does have some element of chronic pain, today feels different and "cardiac" to her and she is  high risk.  Got pain medication and nitro ointment, already took asprin, and will admit for cardiac rule out.  Doubt PE clinically, not hypoxic or tachycardic or tachypneic.    HEART SCORE:    History: +2 for high suspicion  EKG: +0 for normal EKG   Age: +1 for age 30-65 years  Risk factors (includes HLD, HTN, DM, tobacco use, obesity, and +FHx): +2 for known CAD or 3+ risk factors  Initial troponin: +0 for negative troponin    Heart score: 5.  This falls under the following category: Score of 4-6, which indicates low/moderate risk for major adverse cardiac event and supports observation with repeated troponins and/or non-invasive testing    During the patient's ED course, the patient was given:  Medications   nitroglycerin (NITRO-BID) 2 % ointment 1 inch (1 inch Topical Given 06/16/16 1905)   HYDROmorphone (DILAUDID) injection 0.5 mg (0.5 mg Intravenous Given 06/16/16 1953)   iopamidol (ISOVUE-370) 76 % injection 100 mL (100 mLs Intravenous Given 06/16/16 1910)        CLINICAL IMPRESSION  1. Chest pain, unspecified type        DISPOSITION  Rachael Mays was admitted in stable condition.    I have discussed the findings of today's workup with the patient and addressed the patient's questions and concerns.  The plan is to admit to the hospital at this time  under the hospitalist service.  The advanced practice provider spoke with hospitalist who accepted the patient and will take over the patient's care.  The patient is agreeable with this plan.    This chart was created using L[employer]ist.  Efforts were made by me to ensure accuracy, however some errors may be present due to limitations of this technology.              Waldo Laine, MD  06/16/16 2020       Waldo Laine, MD  06/16/16 2020

## 2016-06-16 NOTE — ED Provider Notes (Signed)
[facility] NURSING  eMERGENCY dEPARTMENT eNCOUnter        Pt Name: Rachael Mays  MRN: [id number]  [place] 27-Jun-1956  Date of evaluation: 06/16/2016  Provider: Jeral Fruit, PA  PCP: Harvel Quale, MD  ED Attending: No att. providers found    [place]       Chief Complaint   Patient presents with   ??? Chest Pain     c/o chest tightness yesterday, took one NTG with very little relief. Still having the pain, sob today       HISTORY OF PRESENT ILLNESS   (Location/Symptom, Timing/Onset, Context/Setting, Quality, Duration, Modifying Factors, Severity)  Note limiting factors.     Rachael Mays is a 60 y.o. female with a past medical history of coronary artery disease, CHF, chronic back pain, depression, GERD, hyperlipidemia, history of blood clots, hypertension, obesity and obstructive sleep apnea who presents ED implant or chest pain.  Patient states she has had chest tightness has been going on throughout the day.  States tightness has gradually worsened and described as an 8/10.  Patient her pain management doctor who told her to come the ED for further evaluation and treatment.  Patient states he does have a significant history of cardiac disease and actually had a cardiac catheterization about 2 months ago and had stents placed.  Patient states she does not room #1 her last stress test was but states it was unremarkable prior to the catheterization.  Patient states she is a former smoker.  States she does have a significant history of familial heart disease.  Patient states she takes aspirin but denies any other blood thinning medication.  Denies any recent travel, trips or surgery.  Patient's that she does follow-up with a cardiologist, Dr. Susy Manor for her stent and Dr. Earleen Newport for her congestive heart failure.  States she last saw her cardiologist about a month ago.  Patient states she is taking all medications as prescribed.  Patient states she has had associated chest tightness,  shortness of breath, nausea, becoming diaphoretic and associated swelling to her hands and feet.  Patient states this feels similar to the last time she had the stent placed.  Patient denies any orthopnea, cough, fever/chills, pleuritic chest pain, lightheadedness/dizziness, headache, abdominal pain or vomiting.  Patient states she took a nitroglycerin and aspirin prior to arrival with minimal relief of symptoms.    Nursing Notes were all reviewed and agreed with or any disagreements were addressed  in the HPI.    REVIEW OF SYSTEMS    (2-9 systems for level 4, 10 or more for level 5)     Review of Systems   Constitutional: Positive for diaphoresis. Negative for chills and fever.   Respiratory: Positive for shortness of breath. Negative for cough.    Cardiovascular: Positive for chest pain and leg swelling. Negative for palpitations.   Gastrointestinal: Positive for nausea. Negative for abdominal pain, constipation, diarrhea and vomiting.   Genitourinary: Negative for difficulty urinating and dysuria.   Musculoskeletal: Negative for arthralgias, myalgias, neck pain and neck stiffness.   Skin: Negative for color change, pallor, rash and wound.   Neurological: Negative for dizziness, weakness, light-headedness, numbness and headaches.       Positives and Pertinent negatives as per HPI.  Except as noted above in the ROS, all other systems were reviewed and negative.       PAST MEDICAL HISTORY     Past Medical History:   Diagnosis Date   ???  Anxiety    ??? Asthma    ??? Blood circulation, collateral    ??? CAD (coronary artery disease)    ??? CHF (congestive heart failure) ([place])    ??? Chronic back pain    ??? Deep vein thrombosis    ??? Depression    ??? GERD (gastroesophageal reflux disease)     NO LONGER SINCE LAP   ??? GERD (gastroesophageal reflux disease) 01/23/2009   ??? Hx of blood clots    ??? Hyperlipidemia     hx; resolved with lap band   ??? Hypertension    ??? Obesity     hx of; had lap band   ??? Obstructive sleep apnea (adult)  (pediatric) 09/18/2015   ??? Pulmonary embolism ([place])          SURGICAL HISTORY       Past Surgical History:   Procedure Laterality Date   ??? BACK SURGERY      neck  plates   ??? BREAST SURGERY      left lumpectomy   ??? CESAREAN SECTION     ??? CHOLECYSTECTOMY     ??? COLONOSCOPY     ??? COLONOSCOPY  04/08/10   ??? CORONARY ANGIOPLASTY     ??? DIAGNOSTIC CARDIAC CATH LAB PROCEDURE     ??? DILATATION, ESOPHAGUS     ??? ENDOSCOPY, COLON, DIAGNOSTIC     ??? HYSTERECTOMY     ??? LAP BAND  05/01/08    Dr. Nicole Cella   ??? OTHER SURGICAL HISTORY      [place] Filter: curently in place   ??? OTHER SURGICAL HISTORY  07/05/13    lap band removal   ??? PARTIAL HYSTERECTOMY     ??? SHOULDER SURGERY     ??? UPPER GASTROINTESTINAL ENDOSCOPY  05/19/13   ??? UPPER GASTROINTESTINAL ENDOSCOPY  07/21/13    ESOPHAGEAL STENT PLACEMENT   ??? UPPER GASTROINTESTINAL ENDOSCOPY N/A 07/31/2014    Esophagogastroduodenoscopy with esophageal balloon dilation   ??? VASCULAR SURGERY  04/2015    Lucendia Herrlich, celiac artery angiogram, normal abd arteries         CURRENT MEDICATIONS       Current Discharge Medication List      CONTINUE these medications which have NOT CHANGED    Details   venlafaxine (EFFEXOR XR) 150 MG extended release capsule TAKE ONE CAPSULE BY MOUTH ONE TIME A DAY  Qty: 90 capsule, Refills: 1    Associated Diagnoses: Depression, unspecified depression type      furosemide (LASIX) 40 MG tablet Take 80 mg in am and 40 mg in pm  Qty: 180 tablet, Refills: 3      spironolactone (ALDACTONE) 25 MG tablet Take 0.5 tablets by mouth daily  Qty: 30 tablet, Refills: 0      nystatin (MYCOSTATIN) 100000 UNIT/GM cream Apply topically 2 times daily.  Qty: 30 g, Refills: 2      atorvastatin (LIPITOR) 80 MG tablet Take 1 tablet by mouth daily  Qty: 90 tablet, Refills: 3    Associated Diagnoses: Shortness of breath; Other chest pain; Acute diastolic congestive heart failure ([place]); Essential hypertension; Hyperlipidemia, unspecified hyperlipidemia type      lisinopril (PRINIVIL;ZESTRIL) 40 MG tablet  Take 1 tablet by mouth daily Indications: Patient thought this was dc'd in December by PCP  Qty: 90 tablet, Refills: 3    Associated Diagnoses: Essential hypertension      omeprazole (PRILOSEC) 40 MG delayed release capsule Take 1 capsule by mouth daily  Qty: 90 capsule, Refills: 3  Associated Diagnoses: Gastroesophageal reflux disease without esophagitis      busPIRone (BUSPAR) 10 MG tablet Take 1 tablet by mouth 3 times daily  Qty: 270 tablet, Refills: 3    Associated Diagnoses: Anxiety      metoprolol (LOPRESSOR) 100 MG tablet TAKE TAKE 1 AND 1/2 TABLET BY MOUTH TABLETS BY MOUTH TWO TIMES A DAY  Qty: 270 tablet, Refills: 1      oxyCODONE-acetaminophen (PERCOCET) 5-325 MG per tablet Take 1 tablet by mouth every 6 hours as needed for Pain .  Qty: 40 tablet, Refills: 0      nitroGLYCERIN (NITROSTAT) 0.4 MG SL tablet up to max of 3 total doses. If no relief after 1 dose, call 911.  Qty: 25 tablet, Refills: 3      aspirin 81 MG EC tablet Take 1 tablet by mouth daily  Qty: 30 tablet, Refills: 11      prasugrel (EFFIENT) 10 MG TABS Take 1 tablet by mouth daily  Qty: 90 tablet, Refills: 3      triamcinolone (KENALOG) 0.1 % cream Apply to affected areas twice daily for up to 2 weeks or until improved.  Qty: 80 g, Refills: 1      ALPRAZolam (XANAX) 0.25 MG tablet Take 1 tablet by mouth 2 times daily as needed for Anxiety  Qty: 60 tablet, Refills: 0    Associated Diagnoses: Anxiety      montelukast (SINGULAIR) 10 MG tablet TAKE ONE TABLET BY MOUTH ONE TIME A DAY  Qty: 90 tablet, Refills: 1      traZODone (DESYREL) 100 MG tablet TAKE 1 TABLET BY MOUTH NIGHTLY  Qty: 90 tablet, Refills: 1    Associated Diagnoses: Insomnia, unspecified type      Blood Pressure Monitor KIT Use daily as needed  Qty: 1 kit, Refills: 0      hydrALAZINE (APRESOLINE) 25 MG tablet Take 1 tablet by mouth 3 times daily  Qty: 270 tablet, Refills: 3    Associated Diagnoses: Essential hypertension      ondansetron (ZOFRAN) 4 MG tablet Take 1 tablet by  mouth daily as needed for Nausea or Vomiting  Qty: 30 tablet, Refills: 1      Multiple Vitamins-Minerals (MULTIVITAMIN PO) Take by mouth      CALCIUM-VITAMIN D PO Take by mouth      cetirizine (ZYRTEC ALLERGY) 10 MG tablet Take 10 mg by mouth daily.      albuterol sulfate HFA (PROVENTIL HFA) 108 (90 BASE) MCG/ACT inhaler Inhale 2 puffs into the lungs every 6 hours as needed for Wheezing  Qty: 3 Inhaler, Refills: 3               ALLERGIES     Morphine and Talwin [pentazocine]    FAMILY HISTORY       Family History   Problem Relation Age of Onset   ??? Arthritis Mother    ??? Cancer Mother    ??? Depression Mother    ??? High Blood Pressure Mother    ??? Heart Disease Mother 16     MI    ??? Ovarian Cancer Mother 68   ??? Cancer Father 35     colon   ??? Heart Disease Maternal Grandmother    ??? Early Death Paternal Grandmother    ??? Heart Disease Paternal Grandfather    ??? Diabetes Other    ??? High Blood Pressure Other    ??? Obesity Other    ??? Other Sister  OSA   ??? Other Brother      OSA   ??? Other Brother      OSA   ??? Other Daughter      OSA          SOCIAL HISTORY       Social History     Social History   ??? Marital status: Married     Spouse name: [name]   ??? Number of children: 2   ??? Years of education: 23     Social History Main Topics   ??? Smoking status: Former Smoker     Packs/day: 0.50     Years: 40.00     Types: Cigarettes     Quit date: 08/07/2012   ??? Smokeless tobacco: Former Systems developer     Quit date: 06/16/2013      Comment: started to smoke at age 61 / only smoked 0.5 p.p.d    ??? Alcohol use 0.0 oz/week     0 Standard drinks or equivalent per week      Comment: occasionally    ??? Drug use: No   ??? Sexual activity: No     Other Topics Concern   ??? None     Social History Narrative    ** Merged History Encounter **            SCREENINGS    Glasgow Coma Scale  Eye Opening: Spontaneous  Best Verbal Response: Oriented  Best Motor Response: Obeys commands  Glasgow Coma Scale Score: 15        PHYSICAL EXAM    (up to 7 for level 4, 8 or more for  level 5)   ED Triage Vitals   BP Temp Temp src Pulse Resp SpO2 Height Weight   06/16/16 1748 06/16/16 1748 -- 06/16/16 1748 06/16/16 1748 06/16/16 1748 06/16/16 1748 06/16/16 1748   146/83 98.3 ??F (36.8 ??C)  58 20 100 % '5\' 6"'  (1.676 m) 303 lb (137.4 kg)       Physical Exam   Constitutional: She is oriented to person, place, and time. She appears well-developed and well-nourished.   HENT:   Head: Normocephalic and atraumatic.   Right Ear: External ear normal.   Left Ear: External ear normal.   Eyes: Right eye exhibits no discharge. Left eye exhibits no discharge.   Neck: Normal range of motion. Neck supple.   Cardiovascular: Normal rate, regular rhythm, normal heart sounds and intact distal pulses.  Exam reveals no gallop and no friction rub.    No murmur heard.  Pulmonary/Chest: Effort normal and breath sounds normal. No stridor. No respiratory distress. She has no wheezes. She has no rales. She exhibits no tenderness.   2+ radial pulses bilaterally.  Mild pedal edema.  No calf tenderness.  Negative Homans sign bilaterally.   Abdominal: Soft. She exhibits no distension. There is no tenderness. There is no rebound and no guarding.   Musculoskeletal: Normal range of motion.   Neurological: She is alert and oriented to person, place, and time.   Skin: Skin is warm and dry. She is not diaphoretic. No erythema. No pallor.   Psychiatric: She has a normal mood and affect. Her behavior is normal.       DIAGNOSTIC RESULTS   LABS:    Labs Reviewed   COMPREHENSIVE METABOLIC PANEL - Abnormal; Notable for the following:        Result Value    Glucose 133 (*)     ALT 41 (*)  All other components within normal limits   BRAIN NATRIURETIC PEPTIDE - Abnormal; Notable for the following:     Pro-BNP 216 (*)     All other components within normal limits   CBC   TROPONIN   PROTIME-INR   TROPONIN   TROPONIN   BASIC METABOLIC PANEL   LIPID PANEL   CBC WITH AUTO DIFFERENTIAL       All other labs were within normal range or not returned  as of this dictation.    EKG: All EKG's are interpreted by the Emergency Department Physician who either signs or Co-signs this chart in the absence of a cardiologist.  Please see their note for interpretation of EKG.      RADIOLOGY:   Non-plain film images such as CT, Ultrasound and MRI are read by the radiologist. Plain radiographic images are visualized and preliminarily interpreted by the  ED Provider with the below findings:        Interpretation per the Radiologist below, if available at the time of this note:    CTA Pulmonary With Contrast   Final Result   No evidence of pulmonary embolism.      No acute abnormality.         XR Chest Standard TWO VW   Final Result   No acute process.      Stable cardiomegaly           No results found.      PROCEDURES   Unless otherwise noted below, none     Procedures    CRITICAL CARE TIME   N/A    CONSULTS:  IP CONSULT TO HOSPITALIST  IP CONSULT TO CARDIOLOGY      EMERGENCY DEPARTMENT COURSE and DIFFERENTIAL DIAGNOSIS/MDM:   Vitals:    Vitals:    06/17/16 0015 06/17/16 0216 06/17/16 0222 06/17/16 0437   BP:  (!) 155/69     Pulse: 58 52 52    Resp:  15  14   Temp:  97.2 ??F (36.2 ??C)     TempSrc:  Temporal     SpO2:  96%  95%   Weight:       Height:           Patient was given the following medications:  Medications   albuterol sulfate HFA 108 (90 Base) MCG/ACT inhaler 2 puff (not administered)   aspirin EC tablet 81 mg (not administered)   atorvastatin (LIPITOR) tablet 80 mg (80 mg Oral Given 06/16/16 2207)   busPIRone (BUSPAR) tablet 10 mg (10 mg Oral Given 06/16/16 2207)   hydrALAZINE (APRESOLINE) tablet 25 mg (25 mg Oral Given 06/16/16 2207)   lisinopril (PRINIVIL;ZESTRIL) tablet 40 mg (not administered)   metoprolol tartrate (LOPRESSOR) tablet 100 mg (100 mg Oral Given 06/16/16 2207)   montelukast (SINGULAIR) tablet 10 mg (10 mg Oral Given 06/16/16 2207)   nitroGLYCERIN (NITROSTAT) SL tablet 0.4 mg (not administered)   pantoprazole (PROTONIX) tablet 40 mg (not administered)    prasugrel (EFFIENT) tablet 10 mg (not administered)   spironolactone (ALDACTONE) tablet 12.5 mg (not administered)   traZODone (DESYREL) tablet 100 mg (100 mg Oral Given 06/16/16 2206)   venlafaxine (EFFEXOR XR) extended release capsule 150 mg (not administered)   sodium chloride flush 0.9 % injection 10 mL (10 mLs Intravenous Given 06/16/16 2209)   sodium chloride flush 0.9 % injection 10 mL (not administered)   acetaminophen (TYLENOL) tablet 650 mg (not administered)   magnesium hydroxide (MILK OF MAGNESIA) 400 MG/5ML suspension 30  mL (not administered)   ondansetron (ZOFRAN) injection 4 mg (4 mg Intravenous Given 06/16/16 2059)   enoxaparin (LOVENOX) injection 40 mg (not administered)   furosemide (LASIX) tablet 40 mg (not administered)   furosemide (LASIX) tablet 80 mg (not administered)   oxyCODONE-acetaminophen (PERCOCET) 5-325 MG per tablet 1 tablet (1 tablet Oral Given 06/17/16 0218)   nitroglycerin (NITRO-BID) 2 % ointment 1 inch (1 inch Topical Given 06/16/16 1905)   HYDROmorphone (DILAUDID) injection 0.5 mg (0.5 mg Intravenous Given 06/16/16 1953)   iopamidol (ISOVUE-370) 76 % injection 100 mL (100 mLs Intravenous Given 06/16/16 1910)   ketorolac (TORADOL) injection 15 mg (15 mg Intravenous Given 06/17/16 0218)     Patient is a 60 year old female who presenting plan chest pain.  Upon examination patient afebrile stable vital signs.  Nontoxic in appearance.  Heart regular rate and rhythm.  Lungs respiratory.  Abdomen benign.  EKG showed no acute changes.  Troponin unremarkable.  BNP elevated at 216.  Coags, CMP and CBC relatively unremarkable.  Chest x-ray showed no acute abnormalities.  Given patient's history of previous PE CTA was obtained.  CTA showed no evidence of pulmonary embolism.  No other acute arthralgias noted.  CTA did show coronary calcification of the proximal LAD and to a lesser degree the proximal circumflex.  Given patient's history and physical examination do not believe patient be safely  discharged home at this time.  Patient does have cardiac risk factors and believe patient would benefit from admission for further cardiac evaluation at this time.  Case discussed with the on-call nurse practitioner for the hospitalist team, Georgina Peer, who agreed to admit the patient for further evaluation and treatment.  Patient admitted in stable condition.  Low suspicion for STEMI, PE, dissection, AAA, pneumonia, pneumothorax, respiratory distress, GERD, anxiety, CHF, COPD, asthma, surgical abdomen or other emergent etiology at this time.  Patient was given Dilaudid here in the ED for symptom control.  Did take aspirin prior to arrival.  Did take nitro and was given Nitropaste here in the ED.    The patient tolerated their visit well.  They were seen and evaluated by the attending physician, No att. providers found who agreed with the assessment and plan.  The patient and / or the family were informed of the results of any tests, a time was given to answer questions, a plan was proposed and they agreed with plan.        FINAL IMPRESSION      1. Chest pain, unspecified type          DISPOSITION/PLAN   DISPOSITION Admitted    PATIENT REFERRED TO:  Harvel Quale, [place] [address]  [phone]            DISCHARGE MEDICATIONS:  Current Discharge Medication List          DISCONTINUED MEDICATIONS:  Current Discharge Medication List      STOP taking these medications       predniSONE (DELTASONE) 10 MG tablet Comments:   Reason for Stopping:         acetic acid-hydrocortisone (VOSOL-HC) 1-2 % otic solution Comments:   Reason for Stopping:         ciprofloxacin (CIPRO) 500 MG tablet Comments:   Reason for Stopping:                      (Please note that portions of this note were completed with a  voice recognition program.  Efforts were made to edit the dictations but occasionally words are mis-transcribed.)    Jeral Fruit, PA (electronically signed)         Chaya Jan,  PA  06/17/16 [phone]

## 2016-06-16 NOTE — ED Notes (Signed)
Nursing report called to RN on receiving unit.  RN accepts patient and has no further questions. Joann validates that pt is visible on tele. NSR.      Herbert Pun, RN  06/16/16 2056

## 2016-06-16 NOTE — H&P (Signed)
HOSPITALISTS HISTORY AND PHYSICAL    06/16/2016 10:58 PM    PCP:  Acquanetta Sit SLOUGH, MD (Tel: [phone] )    Chief complaint:  Chest pain    History of Present Illness:  Rachael Mays is a 60 y.o. female coronary artery disease, CHF, chronic back pain, depression, GERD, hyperlipidemia, history of blood clots, hypertension, obesity and obstructive sleep apnea who presented with chest pain that has been ongoing since LAD stent placement in April of this year but that became acutely worse today.  Pain is just left of sternum, aching and radiates into left shoulder.  No exertional component.  No aggrevating factors.  Not releived with NTG.   She has associated shortness of breath and nausea.  She describes feeling like she is drowning, like she cant get a deep breath and then she panics.  She does take PRN xanax and scheduled buspar.  She notes very mild relief with xanax sometimes.  She also notes worsening edema although I don't appreciate this on exam.  Patient was readmitted days after LAD stent placement in April  and again evaluated by cardiology.  They did not feel pain was cardiac in nature and referred her to Dr. Diana Eves of pain management.  She was actually at his office being evaluated by him today and he advised her to come to the ED because of the above pain.  Chest CT without evidence of PE, or pulmonary or acute airspace disease.  There is no evidence of ischemia on EKG and troponin is negative.  She reports compliance with all home medications and low salt diet       History obtained from patient and EMR.     REVIEW OF SYSTEMS:   Constitutional: positive for diaphoresis.  Negative for fever,chills or night sweats  ENT: Negative for rhinorrhea, epistaxis, hoarseness, sore throat.  Respiratory: positive for shortness of breath,no wheezing  Cardiovascular: positive for chest pain, no palpitations   Gastrointestinal: Negative for nausea, negative for vomiting, diarrhea  Genitourinary:  Negative for polyuria, dysuria   Hematologic/Lymphatic: Negative for bleeding tendency, easy bruising  Musculoskeletal: Negative for myalgias and arthralgias  Neurologic: Negative for confusion,dysarthria.  Skin: Negative for itching,rash  Psychiatric: Negative for depression,anxiety, agitation.  Endocrine: Negative for polydipsia,polyuria,heat /cold intolerance.    Past Medical History:   has a past medical history of Anxiety; Asthma; Blood circulation, collateral; CAD (coronary artery disease); CHF (congestive heart failure) ([place]); Chronic back pain; Deep vein thrombosis; Depression; GERD (gastroesophageal reflux disease); GERD (gastroesophageal reflux disease); Hx of blood clots; Hyperlipidemia; Hypertension; Obesity; Obstructive sleep apnea (adult) (pediatric); and Pulmonary embolism ([place]).     Past Surgical History:   has a past surgical history that includes Colonoscopy; Cesarean section; Cholecystectomy; Hysterectomy; shoulder surgery; Lap Band (05/01/08); back surgery; Colonoscopy (04/08/10); other surgical history; Upper gastrointestinal endoscopy (05/19/13); other surgical history (07/05/13); Upper gastrointestinal endoscopy (07/21/13); partial hysterectomy (cervix not removed); Upper gastrointestinal endoscopy (N/A, 07/31/2014); vascular surgery (04/2015); Diagnostic Cardiac Cath Lab Procedure; Coronary angioplasty; Endoscopy, colon, diagnostic; Breast surgery; and Dilatation, esophagus.     Medications Prior to Admission:  Prior to Admission medications    Medication Sig Start Date End Date Taking? Authorizing Provider   venlafaxine (EFFEXOR XR) 150 MG extended release capsule TAKE ONE CAPSULE BY MOUTH ONE TIME A DAY 06/10/16  Yes Harvel Quale, MD   furosemide (LASIX) 40 MG tablet Take 80 mg in am and 40 mg in pm 05/21/16  Yes Dorian Furnace, CNP  spironolactone (ALDACTONE) 25 MG tablet Take 0.5 tablets by mouth daily 05/21/16  Yes Maebelle Munroe Helferich, CNP   nystatin (MYCOSTATIN) 100000 UNIT/GM cream  Apply topically 2 times daily. 05/05/16  Yes Francisca December, MD   atorvastatin (LIPITOR) 80 MG tablet Take 1 tablet by mouth daily 04/30/16  Yes Leodis Binet, MD   lisinopril (PRINIVIL;ZESTRIL) 40 MG tablet Take 1 tablet by mouth daily Indications: Patient thought this was dc'd in December by PCP 04/09/16  Yes Harvel Quale, MD   omeprazole [facility]) 40 MG delayed release capsule Take 1 capsule by mouth daily 04/09/16  Yes Harvel Quale, MD   busPIRone (BUSPAR) 10 MG tablet Take 1 tablet by mouth 3 times daily 04/09/16  Yes Harvel Quale, MD   metoprolol (LOPRESSOR) 100 MG tablet TAKE TAKE 1 AND 1/2 TABLET BY MOUTH TABLETS BY MOUTH TWO TIMES A DAY 04/08/16  Yes Harvel Quale, MD   oxyCODONE-acetaminophen (PERCOCET) 5-325 MG per tablet Take 1 tablet by mouth every 6 hours as needed for Pain . 04/07/16  Yes Bhumit Patel, MD   nitroGLYCERIN (NITROSTAT) 0.4 MG SL tablet up to max of 3 total doses. If no relief after 1 dose, call 911. 04/06/16  Yes Bhumit Patel, MD   aspirin 81 MG EC tablet Take 1 tablet by mouth daily 04/02/16  Yes Leodis Binet, MD   prasugrel (EFFIENT) 10 MG TABS Take 1 tablet by mouth daily 04/02/16  Yes Leodis Binet, MD   triamcinolone (KENALOG) 0.1 % cream Apply to affected areas twice daily for up to 2 weeks or until improved. 03/23/16  Yes Francisca December, MD   ALPRAZolam Duanne Moron) 0.25 MG tablet Take 1 tablet by mouth 2 times daily as needed for Anxiety 03/23/16  Yes Harvel Quale, MD   montelukast (SINGULAIR) 10 MG tablet TAKE ONE TABLET BY MOUTH ONE TIME A DAY 03/18/16  Yes Jamal Collin, MD   traZODone (DESYREL) 100 MG tablet TAKE 1 TABLET BY MOUTH NIGHTLY 01/02/16  Yes Harvel Quale, MD   Blood Pressure Monitor KIT Use daily as needed 12/20/15  Yes Harvel Quale, MD   hydrALAZINE (APRESOLINE) 25 MG tablet Take 1 tablet by mouth 3 times daily 11/12/15  Yes Harvel Quale, MD   ondansetron [facility]) 4 MG tablet Take 1 tablet  by mouth daily as needed for Nausea or Vomiting 06/03/15  Yes Harvel Quale, MD   Multiple Vitamins-Minerals (MULTIVITAMIN PO) Take by mouth   Yes Historical Provider, MD   CALCIUM-VITAMIN D PO Take by mouth   Yes Historical Provider, MD   cetirizine (ZYRTEC ALLERGY) 10 MG tablet Take 10 mg by mouth daily.   Yes Historical Provider, MD   albuterol sulfate HFA (PROVENTIL HFA) 108 (90 BASE) MCG/ACT inhaler Inhale 2 puffs into the lungs every 6 hours as needed for Wheezing 12/27/15   Harvel Quale, MD       Allergies:  Allergies   Allergen Reactions   ??? Morphine Itching   ??? Talwin [Pentazocine] Other (See Comments)     dizzy        Social History:  The patient currently lives at home   Tobacco:  reports that she quit smoking about 3 years ago. Her smoking use included Cigarettes. She has a 20.00 pack-year smoking history. She quit smokeless tobacco use about 3 years ago.  ETOH:  reports that she drinks alcohol.  Family History:  Positive as follows:  family history includes Arthritis in her mother; Cancer in her mother; Cancer (age of onset: 82) in her father; Depression in her mother; Diabetes in an other family member; Early Death in her paternal grandmother; Heart Disease in her maternal grandmother and paternal grandfather; Heart Disease (age of onset: 67) in her mother; High Blood Pressure in her mother and another family member; Obesity in an other family member; Other in her brother, brother, daughter, and sister; Ovarian Cancer (age of onset: 52) in her mother.     PHYSICAL EXAM:  BP (!) 156/71   Pulse 58   Temp 98.2 ??F (36.8 ??C) (Temporal)    Resp 16   Ht '5\' 6"'  (1.676 m)   Wt (!) 303 lb (137.4 kg)   SpO2 95%   BMI 48.91 kg/m2  General appearance: No apparent distress appears stated age and cooperative.  HEENT Normal cephalic, atraumatic without obvious deformity.  Pupils equal, round, and reactive to light.  Extra ocular muscles intact.  Conjunctivae/corneas clear.  Neck: Supple, No jugular  venous distention/bruits.  Trachea midline without thyromegaly or adenopathy with full range of motion.  Lungs: Clear to auscultation, bilaterally without Rales/Wheezes/Rhonchi with good respiratory effort.  Heart: Regular rate and rhythm with Normal S1/S2 without murmurs, rubs or gallops, point of maximum impulse non-displaced  Abdomen: Soft, non-tender or non-distended without rigidity or guarding and positive bowel sounds all four quadrants.  Extremities: trace bilateral LE edema.  No clubbing, cyanosis.  Full range of motion without deformity.  Skin: Skin color, texture, turgor normal.  No rashes or lesions.  Neurologic: Alert and oriented X 3, neurovascularly intact with sensory/motor intact upper extremities/lower extremities, bilaterally.  Cranial nerves: II-XII intact, grossly non-focal.  Mental status: Alert, oriented, thought content appropriate.  Capillary Refill: Acceptable  < 3 seconds  Peripheral Pulses: +3 Easily felt, not easily obliterated with pressure    CXR:  I have reviewed the CXR with the following interpretation: no acute process, stable cardiomegaly   EKG:  I have reviewed the EKG with the following interpretation: sinus bradycardia     CBC   Recent Labs      06/16/16   1833   WBC  8.9   HGB  14.3   HCT  43.5   PLT  177      RENAL  Recent Labs      06/16/16   1833   NA  141   K  3.9   CL  102   CO2  28   BUN  13   CREATININE  0.9     LFT'S  Recent Labs      06/16/16   1833   AST  27   ALT  41*   BILITOT  0.7   ALKPHOS  117     COAG  Recent Labs      06/16/16   1833   INR  0.97     CARDIAC ENZYMES  Recent Labs      06/16/16   1833  06/16/16   2128   TROPONINI  <0.01  <0.01       Assessment/Plan:   ?? Chest pain, acute on chronic.  Pain is identical to the pain she had in April when stent was placed.  She continued to have this pain post stent placement and was actually readmitted a few days post discharge and was again evaluated by cardiology who felt pain was not cardiac in nature and  referred her to Dr. Diana Eves of pain management.  She was being evaluated by him today and he sent her to ED because of chest pain.  ? Anxiety component, she does have some relief with PRN Xanax.  Will consult cardiology per patient request although I am not sure what additional work up is warranted. Monitor on tele, trend troponin.    ?? CAD, s/p LAD DES placement 03/13/16, continue asa, effient, and BB  ?? Chronic diastolic heart failure, compensated.   Continue Lasix, spironolactone, and ACEI  ?? Hypertension, controlled.  Continue home Lisinopril, hydralazine, and metoprolol   ?? OSA, bipap nightly with home unit or settings of 25/11  ?? HLD, continue statin   ?? Morbid obesity, recommend life style modification and weight loss  ?? DVT/PE, s/p greenfield filter   ?? Pulmonary hypertension     DVT Prophylaxis: Lovenox  Diet: Diet NPO, After Midnight  DIET CARDIAC;cardiac, low salt.    Code Status: Full Code  PT/OT Eval Status: not ordered    Admit as Observation. I anticipate hospitalization for less than two midnights based on the above assessment and plan.     Yolonda Kida, APRN    06/16/2016 10:58 PM

## 2016-06-17 LAB — CBC WITH AUTO DIFFERENTIAL
Basophils %: 0.5 %
Basophils Absolute: 0 10*3/uL (ref 0.0–0.2)
Eosinophils %: 6.1 %
Eosinophils Absolute: 0.4 10*3/uL (ref 0.0–0.6)
Hematocrit: 39.4 % (ref 36.0–48.0)
Hemoglobin: 12.5 g/dL (ref 12.0–16.0)
Lymphocytes %: 37.8 %
Lymphocytes Absolute: 2.7 10*3/uL (ref 1.0–5.1)
MCH: 27.4 pg (ref 26.0–34.0)
MCHC: 31.7 g/dL (ref 31.0–36.0)
MCV: 86.2 fL (ref 80.0–100.0)
MPV: 9.7 fL (ref 5.0–10.5)
Monocytes %: 11.6 %
Monocytes Absolute: 0.8 10*3/uL (ref 0.0–1.3)
Neutrophils %: 44 %
Neutrophils Absolute: 3.2 10*3/uL (ref 1.7–7.7)
Platelets: 152 10*3/uL (ref 135–450)
RBC: 4.58 M/uL (ref 4.00–5.20)
RDW: 15.4 % (ref 12.4–15.4)
WBC: 7.3 10*3/uL (ref 4.0–11.0)

## 2016-06-17 LAB — BASIC METABOLIC PANEL
Anion Gap: 10 (ref 3–16)
BUN: 16 mg/dL (ref 7–20)
CO2: 28 mmol/L (ref 21–32)
Calcium: 9 mg/dL (ref 8.3–10.6)
Chloride: 106 mmol/L (ref 99–110)
Creatinine: 1 mg/dL (ref 0.6–1.1)
GFR African American: >60 (ref >60)
GFR Non-African American: 57 — AB (ref >60)
Glucose: 114 mg/dL — ABNORMAL HIGH (ref 70–99)
Potassium: 3.7 mmol/L (ref 3.5–5.1)
Sodium: 144 mmol/L (ref 136–145)

## 2016-06-17 LAB — EKG 12-LEAD
Atrial Rate: 49 {beats}/min
Atrial Rate: 58 {beats}/min
P Axis: 1 degrees
P Axis: 33 degrees
P-R Interval: 126 ms
P-R Interval: 142 ms
Q-T Interval: 432 ms
Q-T Interval: 478 ms
QRS Duration: 70 ms
QRS Duration: 78 ms
QTc Calculation (Bazett): 424 ms
QTc Calculation (Bazett): 431 ms
R Axis: -12 degrees
R Axis: 37 degrees
T Axis: 12 degrees
T Axis: 17 degrees
Ventricular Rate: 49 {beats}/min
Ventricular Rate: 58 {beats}/min

## 2016-06-17 LAB — LIPID PANEL
Cholesterol, Total: 118 mg/dL (ref 0–199)
HDL: 43 mg/dL (ref 40–60)
LDL Calculated: 52 mg/dL (ref <100)
Triglycerides: 114 mg/dL (ref 0–150)
VLDL Cholesterol Calculated: 23 mg/dL

## 2016-06-17 LAB — TROPONIN
Troponin: <0.01 ng/mL (ref <0.01)
Troponin: <0.01 ng/mL (ref <0.01)

## 2016-06-17 MED ORDER — ACETAMINOPHEN 325 MG PO TABS
325 MG | ORAL | Status: DC | PRN
Start: 2016-06-17 — End: 2016-06-18

## 2016-06-17 MED ORDER — ONDANSETRON HCL 4 MG/2ML IJ SOLN
4 MG/2ML | INTRAMUSCULAR | Status: AC
Start: 2016-06-17 — End: 2016-06-16
  Administered 2016-06-17: 01:00:00 4 via INTRAVENOUS

## 2016-06-17 MED ORDER — OXYCODONE-ACETAMINOPHEN 5-325 MG PO TABS
5-325 MG | ORAL | Status: DC | PRN
Start: 2016-06-17 — End: 2016-06-18
  Administered 2016-06-17 – 2016-06-18 (×5): 1 via ORAL

## 2016-06-17 MED ORDER — SPIRONOLACTONE 25 MG PO TABS
25 MG | Freq: Every day | ORAL | Status: DC
Start: 2016-06-17 — End: 2016-06-17

## 2016-06-17 MED ORDER — KETOROLAC TROMETHAMINE 30 MG/ML IJ SOLN
30 MG/ML | Freq: Once | INTRAMUSCULAR | Status: AC
Start: 2016-06-17 — End: 2016-06-17
  Administered 2016-06-17: 06:00:00 15 mg via INTRAVENOUS

## 2016-06-17 MED ORDER — FUROSEMIDE 40 MG PO TABS
40 MG | Freq: Every day | ORAL | Status: DC
Start: 2016-06-17 — End: 2016-06-18
  Administered 2016-06-17 – 2016-06-18 (×2): 80 mg via ORAL

## 2016-06-17 MED ORDER — PRASUGREL HCL 10 MG PO TABS
10 MG | Freq: Every day | ORAL | Status: DC
Start: 2016-06-17 — End: 2016-06-18
  Administered 2016-06-17 – 2016-06-18 (×2): 10 mg via ORAL

## 2016-06-17 MED ORDER — NITROGLYCERIN 0.4 MG SL SUBL
0.4 MG | SUBLINGUAL | Status: DC | PRN
Start: 2016-06-17 — End: 2016-06-18

## 2016-06-17 MED ORDER — ATORVASTATIN CALCIUM 80 MG PO TABS
80 MG | Freq: Every evening | ORAL | Status: DC
Start: 2016-06-17 — End: 2016-06-18
  Administered 2016-06-17 – 2016-06-18 (×2): 80 mg via ORAL

## 2016-06-17 MED ORDER — TRAMADOL HCL 50 MG PO TABS
50 MG | Freq: Four times a day (QID) | ORAL | Status: DC | PRN
Start: 2016-06-17 — End: 2016-06-16

## 2016-06-17 MED ORDER — ASPIRIN 81 MG PO TBEC
81 MG | Freq: Every day | ORAL | Status: DC
Start: 2016-06-17 — End: 2016-06-18
  Administered 2016-06-17 – 2016-06-18 (×2): 81 mg via ORAL

## 2016-06-17 MED ORDER — MONTELUKAST SODIUM 10 MG PO TABS
10 MG | Freq: Every evening | ORAL | Status: DC
Start: 2016-06-17 — End: 2016-06-18
  Administered 2016-06-17 – 2016-06-18 (×2): 10 mg via ORAL

## 2016-06-17 MED ORDER — ALPRAZOLAM 0.25 MG PO TABS
0.25 MG | Freq: Two times a day (BID) | ORAL | Status: DC | PRN
Start: 2016-06-17 — End: 2016-06-16

## 2016-06-17 MED ORDER — OXYCODONE-ACETAMINOPHEN 5-325 MG PO TABS
5-325 MG | Freq: Three times a day (TID) | ORAL | Status: DC | PRN
Start: 2016-06-17 — End: 2016-06-17
  Administered 2016-06-17: 02:00:00 1 via ORAL

## 2016-06-17 MED ORDER — BUSPIRONE HCL 5 MG PO TABS
5 MG | Freq: Three times a day (TID) | ORAL | Status: DC
Start: 2016-06-17 — End: 2016-06-18
  Administered 2016-06-17 – 2016-06-18 (×5): 10 mg via ORAL

## 2016-06-17 MED ORDER — FUROSEMIDE 40 MG PO TABS
40 MG | Freq: Every day | ORAL | Status: DC
Start: 2016-06-17 — End: 2016-06-16

## 2016-06-17 MED ORDER — PANTOPRAZOLE SODIUM 40 MG PO TBEC
40 MG | Freq: Every day | ORAL | Status: DC
Start: 2016-06-17 — End: 2016-06-18
  Administered 2016-06-17 – 2016-06-18 (×2): 40 mg via ORAL

## 2016-06-17 MED ORDER — HYDRALAZINE HCL 25 MG PO TABS
25 MG | Freq: Three times a day (TID) | ORAL | Status: DC
Start: 2016-06-17 — End: 2016-06-17
  Administered 2016-06-17: 02:00:00 25 mg via ORAL

## 2016-06-17 MED ORDER — NORMAL SALINE FLUSH 0.9 % IV SOLN
0.9 % | INTRAVENOUS | Status: DC | PRN
Start: 2016-06-17 — End: 2016-06-18
  Administered 2016-06-17: 21:00:00 10 mL via INTRAVENOUS

## 2016-06-17 MED ORDER — FUROSEMIDE 40 MG PO TABS
40 MG | Freq: Every evening | ORAL | Status: DC
Start: 2016-06-17 — End: 2016-06-18
  Administered 2016-06-17: 21:00:00 40 mg via ORAL

## 2016-06-17 MED ORDER — VENLAFAXINE HCL ER 150 MG PO CP24
150 MG | Freq: Every day | ORAL | Status: DC
Start: 2016-06-17 — End: 2016-06-18
  Administered 2016-06-17 – 2016-06-18 (×2): 150 mg via ORAL

## 2016-06-17 MED ORDER — METOPROLOL TARTRATE 50 MG PO TABS
50 MG | Freq: Two times a day (BID) | ORAL | Status: DC
Start: 2016-06-17 — End: 2016-06-18
  Administered 2016-06-17 – 2016-06-18 (×3): 100 mg via ORAL

## 2016-06-17 MED ORDER — TRAZODONE HCL 50 MG PO TABS
50 MG | Freq: Every evening | ORAL | Status: DC | PRN
Start: 2016-06-17 — End: 2016-06-18
  Administered 2016-06-17 – 2016-06-18 (×2): 100 mg via ORAL

## 2016-06-17 MED ORDER — MAGNESIUM HYDROXIDE 400 MG/5ML PO SUSP
400 MG/5ML | Freq: Every day | ORAL | Status: DC | PRN
Start: 2016-06-17 — End: 2016-06-18
  Administered 2016-06-18: 07:00:00 30 mL via ORAL

## 2016-06-17 MED ORDER — LISINOPRIL 20 MG PO TABS
20 MG | Freq: Every day | ORAL | Status: DC
Start: 2016-06-17 — End: 2016-06-18
  Administered 2016-06-17 – 2016-06-18 (×2): 40 mg via ORAL

## 2016-06-17 MED ORDER — ENOXAPARIN SODIUM 40 MG/0.4ML SC SOLN
40 MG/0.4ML | Freq: Every day | SUBCUTANEOUS | Status: DC
Start: 2016-06-17 — End: 2016-06-18
  Administered 2016-06-17 – 2016-06-18 (×2): 40 mg via SUBCUTANEOUS

## 2016-06-17 MED ORDER — ONDANSETRON HCL 4 MG/2ML IJ SOLN
4 MG/2ML | Freq: Four times a day (QID) | INTRAMUSCULAR | Status: DC | PRN
Start: 2016-06-17 — End: 2016-06-18
  Administered 2016-06-17 – 2016-06-18 (×3): 4 mg via INTRAVENOUS

## 2016-06-17 MED ORDER — NORMAL SALINE FLUSH 0.9 % IV SOLN
0.9 % | Freq: Two times a day (BID) | INTRAVENOUS | Status: DC
Start: 2016-06-17 — End: 2016-06-18
  Administered 2016-06-17 – 2016-06-18 (×4): 10 mL via INTRAVENOUS

## 2016-06-17 MED ORDER — ALBUTEROL SULFATE HFA 108 (90 BASE) MCG/ACT IN AERS
108 (90 Base) MCG/ACT | Freq: Four times a day (QID) | RESPIRATORY_TRACT | Status: DC | PRN
Start: 2016-06-17 — End: 2016-06-18

## 2016-06-17 MED FILL — BUSPIRONE HCL 5 MG PO TABS: 5 MG | ORAL | Qty: 2

## 2016-06-17 MED FILL — OXYCODONE-ACETAMINOPHEN 5-325 MG PO TABS: 5-325 MG | ORAL | Qty: 1

## 2016-06-17 MED FILL — NORMAL SALINE FLUSH 0.9 % IV SOLN: 0.9 % | INTRAVENOUS | Qty: 10

## 2016-06-17 MED FILL — ONDANSETRON HCL 4 MG/2ML IJ SOLN: 4 MG/2ML | INTRAMUSCULAR | Qty: 2

## 2016-06-17 MED FILL — KETOROLAC TROMETHAMINE 30 MG/ML IJ SOLN: 30 MG/ML | INTRAMUSCULAR | Qty: 1

## 2016-06-17 MED FILL — FUROSEMIDE 40 MG PO TABS: 40 MG | ORAL | Qty: 2

## 2016-06-17 MED FILL — PROAIR HFA 108 (90 BASE) MCG/ACT IN AERS: 108 (90 Base) MCG/ACT | RESPIRATORY_TRACT | Qty: 8.5

## 2016-06-17 MED FILL — SINGULAIR 10 MG PO TABS: 10 MG | ORAL | Qty: 1

## 2016-06-17 MED FILL — LIPITOR 80 MG PO TABS: 80 MG | ORAL | Qty: 1

## 2016-06-17 MED FILL — EFFEXOR XR 150 MG PO CP24: 150 MG | ORAL | Qty: 1

## 2016-06-17 MED FILL — LISINOPRIL 20 MG PO TABS: 20 MG | ORAL | Qty: 2

## 2016-06-17 MED FILL — TRAZODONE HCL 50 MG PO TABS: 50 MG | ORAL | Qty: 2

## 2016-06-17 MED FILL — METOPROLOL TARTRATE 50 MG PO TABS: 50 MG | ORAL | Qty: 2

## 2016-06-17 MED FILL — FUROSEMIDE 40 MG PO TABS: 40 MG | ORAL | Qty: 1

## 2016-06-17 MED FILL — PANTOPRAZOLE SODIUM 40 MG PO TBEC: 40 MG | ORAL | Qty: 1

## 2016-06-17 MED FILL — ASPIRIN EC 81 MG PO TBEC: 81 MG | ORAL | Qty: 1

## 2016-06-17 MED FILL — LOVENOX 40 MG/0.4ML SC SOLN: 40 MG/0.4ML | SUBCUTANEOUS | Qty: 0.4

## 2016-06-17 MED FILL — HYDRALAZINE HCL 25 MG PO TABS: 25 MG | ORAL | Qty: 1

## 2016-06-17 MED FILL — EFFIENT 10 MG PO TABS: 10 MG | ORAL | Qty: 1

## 2016-06-17 NOTE — Progress Notes (Signed)
Pt sleeping in bed, lying on right side, BiPap in use. RR easy and unlabored. Call light within reach. Will continue to monitor.

## 2016-06-17 NOTE — Plan of Care (Signed)
Problem: Pain:  Goal: Control of acute pain  Control of acute pain   Outcome: Ongoing  PRN pain medication available, educated on meaning of PRN, educated on pain scale, educated on nonpharmacologic pain control, call light within reach, q2h rounding.

## 2016-06-17 NOTE — Consults (Signed)
[place]   Cardiovascular Evaluation    PATIENT: Rachael Mays  DATE: 06/17/2016  MRN: [id number]  CSN: [id number]  DOB: 02-Dec-1956    Primary Care Doctor: Acquanetta Sit SLOUGH, MD    Reason for evaluation:   Chest Pain (c/o chest tightness yesterday, took one NTG with very little relief. Still having the pain, sob today)      Subjective:    History of present illness on initial date of evaluation:   Rachael Mays is a 60 y.o. patient who presents to the hospital with complaints of chest pain, SOB, and swelling for the past couple days. She states that she has noticed these symptoms regularily but these where a little more worse.  .        Patient Active Problem List   Diagnosis   ??? Back pain   ??? Deep vein thrombosis ([place])   ??? Depression   ??? Hyperlipidemia   ??? Pulmonary embolism ([place])   ??? Nausea & vomiting   ??? Abdominal  pain, other specified site   ??? Morbid obesity due to excess calories ([place])   ??? Status post gastric banding   ??? Vertigo   ??? Dizziness   ??? Tinnitus, subjective   ??? Other complications of gastric band procedure   ??? Essential hypertension   ??? Hyperglycemia   ??? Atypical chest pain   ??? 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 Adrenal mass, left ([place])   ??? Adrenal nodule ([place])   ??? Obstructive sleep apnea (adult) (pediatric)   ??? Dyspnea and respiratory abnormalities   ??? Abnormal stress test   ??? Shortness of breath   ??? Acute diastolic congestive heart failure ([place])   ??? Coronary artery disease due to lipid rich plaque   ??? Pulmonary hypertension ([place])   ??? Unstable angina ([place])   ??? 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 Chest pain   ??? Acute actinic otitis externa of both ears   ??? Bilateral acute serous otitis media         Cardiac Testing: I have reviewed the findings below.  EKG:  ECHO:   STRESS TEST:  CATH:  BYPASS:  VASCULAR:    Past Medical History:   has a past medical history of Anxiety; Asthma; Blood circulation, collateral; CAD  (coronary artery disease); CHF (congestive heart failure) ([place]); Chronic back pain; Deep vein thrombosis; Depression; GERD (gastroesophageal reflux disease); GERD (gastroesophageal reflux disease); Hx of blood clots; Hyperlipidemia; Hypertension; Obesity; Obstructive sleep apnea (adult) (pediatric); and Pulmonary embolism ([place]).    Surgical History:   has a past surgical history that includes Colonoscopy; Cesarean section; Cholecystectomy; Hysterectomy; shoulder surgery; Lap Band (05/01/08); back surgery; Colonoscopy (04/08/10); other surgical history; Upper gastrointestinal endoscopy (05/19/13); other surgical history (07/05/13); Upper gastrointestinal endoscopy (07/21/13); partial hysterectomy (cervix not removed); Upper gastrointestinal endoscopy (N/A, 07/31/2014); vascular surgery (04/2015); Diagnostic Cardiac Cath Lab Procedure; Coronary angioplasty; Endoscopy, colon, diagnostic; Breast surgery; and Dilatation, esophagus.     Social History:   reports that she quit smoking about 3 years ago. Her smoking use included Cigarettes. She has a 20.00 pack-year smoking history. She quit smokeless tobacco use about 3 years ago. She reports that she drinks alcohol. She reports that she does not use illicit drugs.     Family History:  No evidence for sudden cardiac death or premature CAD    Medications:  Reviewed and are listed in nursing record. and/or  listed below  Outpatient Medications:  Prior to Admission medications    Medication Sig Start Date End Date Taking? Authorizing Provider   venlafaxine (EFFEXOR XR) 150 MG extended release capsule TAKE ONE CAPSULE BY MOUTH ONE TIME A DAY 06/10/16  Yes Harvel Quale, MD   furosemide (LASIX) 40 MG tablet Take 80 mg in am and 40 mg in pm 05/21/16  Yes Dorian Furnace, CNP   spironolactone (ALDACTONE) 25 MG tablet Take 0.5 tablets by mouth daily 05/21/16  Yes Maebelle Munroe Helferich, CNP   nystatin (MYCOSTATIN) 100000 UNIT/GM cream Apply topically 2 times daily. 05/05/16  Yes  Francisca December, MD   atorvastatin (LIPITOR) 80 MG tablet Take 1 tablet by mouth daily 04/30/16  Yes Leodis Binet, MD   lisinopril (PRINIVIL;ZESTRIL) 40 MG tablet Take 1 tablet by mouth daily Indications: Patient thought this was dc'd in December by PCP 04/09/16  Yes Harvel Quale, MD   omeprazole [facility]) 40 MG delayed release capsule Take 1 capsule by mouth daily 04/09/16  Yes Harvel Quale, MD   busPIRone (BUSPAR) 10 MG tablet Take 1 tablet by mouth 3 times daily 04/09/16  Yes Harvel Quale, MD   metoprolol (LOPRESSOR) 100 MG tablet TAKE TAKE 1 AND 1/2 TABLET BY MOUTH TABLETS BY MOUTH TWO TIMES A DAY 04/08/16  Yes Harvel Quale, MD   oxyCODONE-acetaminophen (PERCOCET) 5-325 MG per tablet Take 1 tablet by mouth every 6 hours as needed for Pain . 04/07/16  Yes Bhumit Patel, MD   nitroGLYCERIN (NITROSTAT) 0.4 MG SL tablet up to max of 3 total doses. If no relief after 1 dose, call 911. 04/06/16  Yes Bhumit Patel, MD   aspirin 81 MG EC tablet Take 1 tablet by mouth daily 04/02/16  Yes Leodis Binet, MD   prasugrel (EFFIENT) 10 MG TABS Take 1 tablet by mouth daily 04/02/16  Yes Leodis Binet, MD   triamcinolone (KENALOG) 0.1 % cream Apply to affected areas twice daily for up to 2 weeks or until improved. 03/23/16  Yes Francisca December, MD   ALPRAZolam Duanne Moron) 0.25 MG tablet Take 1 tablet by mouth 2 times daily as needed for Anxiety 03/23/16  Yes Harvel Quale, MD   montelukast (SINGULAIR) 10 MG tablet TAKE ONE TABLET BY MOUTH ONE TIME A DAY 03/18/16  Yes Jamal Collin, MD   traZODone (DESYREL) 100 MG tablet TAKE 1 TABLET BY MOUTH NIGHTLY 01/02/16  Yes Harvel Quale, MD   Blood Pressure Monitor KIT Use daily as needed 12/20/15  Yes Harvel Quale, MD   hydrALAZINE (APRESOLINE) 25 MG tablet Take 1 tablet by mouth 3 times daily 11/12/15  Yes Harvel Quale, MD   ondansetron [facility]) 4 MG tablet Take 1 tablet by mouth daily as needed for Nausea or  Vomiting 06/03/15  Yes Harvel Quale, MD   Multiple Vitamins-Minerals (MULTIVITAMIN PO) Take by mouth   Yes Historical Provider, MD   CALCIUM-VITAMIN D PO Take by mouth   Yes Historical Provider, MD   cetirizine (ZYRTEC ALLERGY) 10 MG tablet Take 10 mg by mouth daily.   Yes Historical Provider, MD   albuterol sulfate HFA (PROVENTIL HFA) 108 (90 BASE) MCG/ACT inhaler Inhale 2 puffs into the lungs every 6 hours as needed for Wheezing 12/27/15   Harvel Quale, MD       In-patient schedule medications:  ??? aspirin  81  mg Oral Daily   ??? atorvastatin  80 mg Oral Nightly   ??? busPIRone  10 mg Oral TID   ??? hydrALAZINE  25 mg Oral TID   ??? lisinopril  40 mg Oral Daily   ??? metoprolol  100 mg Oral BID   ??? montelukast  10 mg Oral Nightly   ??? pantoprazole  40 mg Oral QAM AC   ??? prasugrel  10 mg Oral Daily   ??? spironolactone  12.5 mg Oral Daily   ??? venlafaxine  150 mg Oral Daily with breakfast   ??? sodium chloride flush  10 mL Intravenous 2 times per day   ??? enoxaparin  40 mg Subcutaneous Daily   ??? furosemide  40 mg Oral Nightly   ??? furosemide  80 mg Oral Daily         Infusion Medications:         Allergies:  Morphine and Talwin [pentazocine]     Review of Systems:   All 12 point review of symptoms completed. Pertinent positives identified in the HPI, all other review of symptoms negative as below.    Physical Examination:    Vitals:    06/17/16 0910   BP: (!) 101/53   Pulse: 53   Resp: 16   Temp: 97.2 ??F (36.2 ??C)   SpO2: 97%    Weight: (!) 303 lb (137.4 kg)     Wt Readings from Last 3 Encounters:   06/16/16 (!) 303 lb (137.4 kg)   05/21/16 (!) 305 lb 12.8 oz (138.7 kg)   05/20/16 (!) 304 lb (137.9 kg)     No intake or output data in the 24 hours ending 06/17/16 1039    General Appearance:  Alert, cooperative, no distress, appears stated age   Head:  Normocephalic, without obvious abnormality, atraumatic   Eyes:  PERRL, conjunctiva/corneas clear       Nose: Nares normal, no drainage or sinus tenderness   Throat:  Lips, mucosa, and tongue normal   Neck: Supple, symmetrical, trachea midline, no adenopathy, thyroid: not enlarged, symmetric, no tenderness/mass/nodules, no carotid bruit or JVD       Lungs:   Clear to auscultation bilaterally, respirations unlabored   Chest Wall:  No tenderness or deformity   Heart:  Regular rhythm and normal rate; S1, S2 are normal; no murmur noted; no rub or gallop   Abdomen:   Obese,soft, non-tender, bowel sounds active all four quadrants,  no masses, no organomegaly           Extremities: Extremities normal, atraumatic, no cyanosis or edema   Pulses: 2+ and symmetric   Skin: Skin color, texture, turgor normal, no rashes or lesions   Pysch: Normal mood and affect   Neurologic: Normal gross motor and sensory exam.         Labs  Recent Labs      06/16/16   1833  06/17/16   0542   WBC  8.9  7.3   HGB  14.3  12.5   HCT  43.5  39.4   MCV  84.2  86.2   PLT  177  152     Recent Labs      06/16/16   1833  06/17/16   0542   CREATININE  0.9  1.0   BUN  13  16   NA  141  144   K  3.9  3.7   CL  102  106   CO2  28  28  Recent Labs      06/16/16   1833   INR  0.97   PROTIME  11.0     Recent Labs      06/16/16   1833  06/16/16   2128  06/17/16   0117   TROPONINI  <0.01  <0.01  <0.01     Invalid input(s): PRO-BNP  Recent Labs      06/17/16   0542   CHOL  118   HDL  43         Imaging:  I have reviewed the below testing personally and my interpretation is below.  EKG: Sinus bradycardia  CXR:      Assessment:  60 y.o. patient with:  1. Chest pain    ~atypical vs typical   ~appears small vessel angina  2. Hypertension   ~labile   ~possible over-medication    Plan:  1.  The patient's symptoms and associated risk factors suggest angina from small vessel disease.  2. Serial troponin levels will be ordered and reviewed  3. The patient has been given instructions on addressing diet, regular exercise, weight control, smoking abstention, medication compliance, and stress minimization.  4. DC hydralazine  5. DC  aldactgone  6. She can go home later today if feeling better  7. See [place] in a few weeks      All questions and concerns were addressed to the patient/family. Alternatives to my treatment were discussed. The note was completed using EMR. Every effort was made to ensure accuracy; however, inadvertent computerized transcription errors may be present.    Truman Hayward, MD, [place], [facility], [place]  [phone] [place] office  [phone] Main central  06/17/2016  10:39 AM

## 2016-06-17 NOTE — Progress Notes (Signed)
VSS. Shift assessment completed. Pt A&O x4. Pt c/o pain, will medicate when due. POC reviewed, all questions answered. Pt denies any further needs. Bed alarm engaged. Call light in reach.

## 2016-06-17 NOTE — Progress Notes (Signed)
Shift assessment complete-see doc flowsheets. VSS. BP low, held all BP medications until cardiology rounds. Held all other medications as well pending cardiology recs. Instructed pt to dangle before walking, pt v/u. No needs expressed at this time. Call light within reach. Will continue to monitor.

## 2016-06-17 NOTE — Discharge Instructions (Signed)
As tolerated

## 2016-06-17 NOTE — Other (Addendum)
Patient Acct Nbr:  [id number]  Primary AUTH/CERT:    [place] Name:   Rachael Mays MED  Primary Insurance Plan Name:  [place] [facility] [id number]  Primary Insurance Group Number:  [id number]  Primary Insurance Plan Type: N  Primary Insurance Policy Number:  [id number]

## 2016-06-17 NOTE — Progress Notes (Signed)
RESPIRATORY THERAPY ASSESSMENT    Rachael Mays   [id number]  06/20/1956      Recent Surgical Procedures:  None  Pulmonary History:  PE, OSA, Pulm. HTN  Home Respiratory Therapy:  Alb q6 prn  Home Oxygen Therapy:  None  Current Respiratory Therapy:  Alb q6 prn, Bipap nightly  Vitals:    06/16/16 2350   BP: (!) 91/50   Pulse: 54   Resp:    Temp:    SpO2:        MPVC: 884   mL  Actual VC: UTA mL  Sputum:  ,  ,      ASSESSMENT OF RESPIRATORY SEVERITY INDEX (RSI)    Patients with orders for inhalation medications, oxygen, or any therapeutic treatment modality will be placed on Respiratory Protocol.  They will be assessed with the first treatment and at least every 72 hours thereafter.  The following severity scale will be used to determine frequency of treatment intervention.    Respiratory History: Pulmonary Disease or Smoking History, Greater than 15 pack year = 2    Recent Surgical History: None = 0    Level of Consciousness: Alert, Oriented, and Cooperative = 0    Level of Activity: Walking unassisted = 0    Respiratory Pattern: Regular Pattern; RR 8-20 = 0    Breath Sounds: Clear = 0    Cough: Strong, spontaneous, non-productive = 0    SPO2 (COPD values may differ): Greater than or equal to 92% on room air = 0    Peak Flow (asthma only): not applicable = 0    RSI: 0-4 = See once and convert to home regimen or discontinue  PLAN    Goals: volume expansion    Patient educated on: Oxygenation and Deep Breath and Cough     Comment / Outcomes: Pt admitted for chest pain. No resp. Complaints at this time  Plan of Care: Continue current regimen    Is patient being placed on Home Treatment Regimen? Yes      Respiratory Protocol Guidelines    1. Assessment and treatment by Respiratory Therapy will be initiated for medication and therapeutic interventions upon initiation of aerosolized medication.  2. Physician will be contacted for respiratory rate (RR) greater than 35 breaths per minute. Therapy will be held for heart  rate (HR) greater than 140 beats per minute, pending direction from physician.  3. Bronchodilators will be administered via Metered Dose Inhaler (MDI) or Hydrofluoroalkane (HFA) with spacer when the following criteria are met:  a. Alert and cooperative     b. HR < 140 bpm  c. RR < 30 bpm                d. Can demonstrate a 2???3 second inspiratory hold  4. Bronchodilators will be administered via Hand Held Nebulizer [facility]) to patients when ANY of the following criteria are met  a. Incogizant or uncooperative          b. Patients treated with HHN at Home        c. Unable to demonstrate proper MDI or HFA technique     d. RR > 30 bpm   5. Bronchodilators will be delivered via Metered Dose Inhaler (MDI) or Hydrofluoroalkane (HFA) with spacer to intubated patients on mechanical ventilation.  6. Inhalation medication orders will be delivered and/or substituted as outlined below.    Aerosolized Medications Ordering and Administration Guidelines:    1. All Medications will be ordered by a  physician, and their frequency and/or modality will be adjusted as defined by the patients Respiratory Severity Index (RSI) score.  2. If the patient does not have documented COPD, consider discontinuing anticholinergics when RSI is less than 9.  3. If the bronchospasm worsens (increased RSI), then the bronchodilator frequency can be increased to a maximum of every 4 hours.  If greater than every 4 hours is required, the physician will be contacted.  4. If the bronchospasm improves, the frequency of the bronchodilator can be decreased, based on the patient's RSI, but not less than home treatment regimen frequency.  5. Bronchodilator(s) will be discontinued if patient has a RSI less than 9 and has received no scheduled or as needed treatment for 72  Hrs.    Patients Ordered on a Mucolytic Agent:    1. Must always be administered with a bronchodilator.    2. Discontinue if patient experiences worsened bronchospasm, or secretions have lessened  to the point that the patient is able to clear them with a cough.    Anti-inflammatory and Combination Medications:    1. If the patient lacks prior history of lung disease, is not using inhaled anti-inflammatory medication at home, and lacks wheezing by examination or by history for at least 24 hours, contact physician for possible discontinuation.

## 2016-06-17 NOTE — Progress Notes (Signed)
VSS. Pt resting in bed with eyes open, respirations easy and unlabored. Pt still c/o constant chest pain. Will medicate when due. Pt denies any further needs. Bed alarm engaged. Call light in reach.

## 2016-06-17 NOTE — Care Coordination-Inpatient (Signed)
[place] Transitions Interview     06/17/2016    Patient: Rachael Mays Patient DOB: 1956/11/02   MRN: [id number]  Reason for Admission: chest pain   RARS: Geisinger Risk Score: 23.75       [name]      Readmission Risk  Patient Active Problem List   Diagnosis   ??? Back pain   ??? Deep vein thrombosis ([place])   ??? Depression   ??? Hyperlipidemia   ??? Pulmonary embolism ([place])   ??? Nausea & vomiting   ??? Abdominal  pain, other specified site   ??? Morbid obesity due to excess calories ([place])   ??? Status post gastric banding   ??? Vertigo   ??? Dizziness   ??? Tinnitus, subjective   ??? Other complications of gastric band procedure   ??? Essential hypertension   ??? Hyperglycemia   ??? Atypical chest pain   ??? 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 Adrenal mass, left ([place])   ??? Adrenal nodule ([place])   ??? Obstructive sleep apnea (adult) (pediatric)   ??? Dyspnea and respiratory abnormalities   ??? Abnormal stress test   ??? Shortness of breath   ??? Acute diastolic congestive heart failure ([place])   ??? Coronary artery disease due to lipid rich plaque   ??? Pulmonary hypertension ([place])   ??? Unstable angina ([place])   ??? 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 Chest pain   ??? Acute actinic otitis externa of both ears   ??? Bilateral acute serous otitis media       Inpatient Assessment  Care Transitions Summary    Care Transitions Inpatient Review   Medication Review   Are you able to afford your medications?:  Yes   How often do you have difficulty taking your medications?:  I always take them as prescribed.         Housing Review   Who do you live with?:  Partner/Spouse/SO   Are you an active caregiver in your home?:  No            Social Support   Do you have a Case Worker?:  No   Do you have a Interior and spatial designer?:  No            Durable Medical Equipment            Functional Review   Ability to seek help/take action for Emergent/Urgent situations i.e. fire, crime, inclement weather or  health crisis.:  Independent   Ability handle personal hygiene needs (bathing/dressing/grooming):  Independent   Ability to manage medications:  Independent   Ability to prepare food:  Independent   Ability to maintain home (clean home, laundry):  Independent   Ability to drive and/or has transportation:  Independent   Ability to do shopping:  Independent   Ability to manage finances:  Independent   Is patient able to live independently?:  Yes            Hearing and Vision   Visual Impairment:  None   Hearing Impairment:  None            Care Transitions Interventions   No Identified Needs               plans to return home with spouse, no HHC or DME needs at this time. Agreed to CTC f/u calls after d/c    Follow Up  Future  Appointments  Date Time Provider [place]   06/23/2016 3:00 PM Connye Burkitt, MD [facility]   06/25/2016 11:30 AM Francisca December, MD [facility]   07/07/2016 1:15 PM Leodis Binet, MD [facility]   07/28/2016 10:00 AM Tobin Chad, MD [facility]   11/10/2016 12:20 PM Tiffanie Rita Ohara, CNP [facility]       Health Maintenance  There are no preventive care reminders to display for this patient.    Iver Nestle, RN

## 2016-06-17 NOTE — Plan of Care (Signed)
Problem: Falls - Risk of  Goal: Absence of falls  Outcome: Ongoing  Plan of care reviewed. Reports no questions. Pt remains free from falls. Bed alarm engaged. Call light in reach. Orange blanket tied to bed. Fall risk armband on pt wrist.

## 2016-06-17 NOTE — Progress Notes (Signed)
Pt is medically stable for d/c per MD, discharge order in place. Pt refusing to leave until the AM. Pt states that her husband has to work tonight and she does not want to be home alone while she is having chest pain. MD notified. Will continue to monitor.

## 2016-06-17 NOTE — Progress Notes (Addendum)
Patient placed on Vision Bipap.Patient could not tolerate home Bipap settings of 25/11. Settings titrated until patient was able to tolerate, final settings are 12/6. SpO2 93. Will continue to monitor.

## 2016-06-17 NOTE — Discharge Instructions (Signed)
???   Good nutrition is important when healing from an illness, injury, or surgery.  Follow any nutrition recommendations given to you during your hospital stay.   ??? If you were given an oral nutrition supplement while in the hospital, continue to take this supplement at home.  You can take it with meals, in-between meals, and/or before bedtime. These supplements can be purchased at most local grocery stores, pharmacies, and chain super-stores.   ??? If you have any questions about your diet or nutrition, call the hospital and ask for the dietitian.    Cardiac diet

## 2016-06-17 NOTE — Discharge Summary (Signed)
[facility] DISCHARGE SUMMARY    Patient Demographics    Patient. Rachael Mays  Date of Birth. [birth year]  MRN. [id number]     Primary care provider. Acquanetta Sit SLOUGH, MD  (Tel: [phone])    Admit date: 06/16/2016    Discharge date (blank if same as Note Date):   Note Date: 06/17/2016     Reason for Hospitalization.   Chief Complaint   Patient presents with   ??? Chest Pain     c/o chest tightness yesterday, took one NTG with very little relief. Still having the pain, sob today           Active Problems:    Chest pain  CAD  Chronic diastolic chf  HTN  Hyperlipidemia  Morbid obesity  Hx of PE     Problems and results from this hospitalization that need follow up.  1. above    Significant test results and incidental findings.  1. CTA no PE    Invasive procedures and treatments.   1. None     Hospital Course.  PT admitted with chest pain.  Has had recurrent CP since stenting in April.  Readmitted again with CP.  Cardiology evaluation recommended med adjustments but no intervention.  Pt cleared for dc later today.  Will need follow up with cardiology.  Also has PCP and pain management doc to follow up with.  I think anxiety is also contributing some to her pain as her benzo helps some    Condition at discharge:stable    Consults.  IP CONSULT TO HOSPITALIST  IP CONSULT TO CARDIOLOGY    Physical examination on discharge day.   BP (!) 151/80   Pulse 54   Temp 98 ??F (36.7 ??C) (Temporal)    Resp 16   Ht '5\' 6"'  (1.676 m)   Wt (!) 303 lb (137.4 kg)   SpO2 97%   BMI 48.91 kg/m2  General appearance.  Alert. Looks comfortable.  HEENT. Moist mucus membranes.  Cardiovascular. Regular rate and rhythm, normal S1, S2. No murmur.   Respiratory. Not using accessory muscles.Clear to auscultation bilaterally, no wheeze.  Gastrointestinal. Abdomen soft, non-tender, not distended, normal bowel sounds; obese  Neurology. Facial  symmetry. No speech deficits. Moving all extremities equally.  Extremities. No edema in lower extremities.  Skin. Warm, dry, normal turgor    Medication instructions provided to patient at discharge.     Medication List      CONTINUE taking these medications          albuterol sulfate HFA 108 (90 Base) MCG/ACT inhaler   Commonly known as:  PROVENTIL HFA   Inhale 2 puffs into the lungs every 6 hours as needed for Wheezing       ALPRAZolam 0.25 MG tablet   Commonly known as:  XANAX   Take 1 tablet by mouth 2 times daily as needed for Anxiety       aspirin 81 MG EC tablet   Take 1 tablet by mouth daily       atorvastatin 80 MG tablet   Commonly known as:  LIPITOR   Take 1 tablet by mouth daily       Blood Pressure Monitor Kit   Use daily as needed       busPIRone 10 MG tablet   Commonly known as:  BUSPAR   Take 1 tablet by mouth 3 times daily       CALCIUM-VITAMIN D PO  furosemide 40 MG tablet   Commonly known as:  LASIX   Take 80 mg in am and 40 mg in pm       lisinopril 40 MG tablet   Commonly known as:  PRINIVIL;ZESTRIL   Take 1 tablet by mouth daily Indications: Patient thought this was dc'd in December by PCP       metoprolol 100 MG tablet   Commonly known as:  LOPRESSOR   TAKE TAKE 1 AND 1/2 TABLET BY MOUTH TABLETS BY MOUTH TWO TIMES A DAY       montelukast 10 MG tablet   Commonly known as:  SINGULAIR   TAKE ONE TABLET BY MOUTH ONE TIME A DAY       MULTIVITAMIN PO       nitroGLYCERIN 0.4 MG SL tablet   Commonly known as:  NITROSTAT   up to max of 3 total doses. If no relief after 1 dose, call 911.       nystatin 100000 UNIT/GM cream   Commonly known as:  MYCOSTATIN   Apply topically 2 times daily.       omeprazole 40 MG delayed release capsule   Commonly known as:  PRILOSEC   Take 1 capsule by mouth daily       ondansetron 4 MG tablet   Commonly known as:  ZOFRAN   Take 1 tablet by mouth daily as needed for Nausea or Vomiting       oxyCODONE-acetaminophen 5-325 MG per tablet   Commonly known as:  PERCOCET    Take 1 tablet by mouth every 6 hours as needed for Pain .       prasugrel 10 MG Tabs   Commonly known as:  EFFIENT   Take 1 tablet by mouth daily       traZODone 100 MG tablet   Commonly known as:  DESYREL   TAKE 1 TABLET BY MOUTH NIGHTLY       triamcinolone 0.1 % cream   Commonly known as:  KENALOG   Apply to affected areas twice daily for up to 2 weeks or until improved.       venlafaxine 150 MG extended release capsule   Commonly known as:  EFFEXOR XR   TAKE ONE CAPSULE BY MOUTH ONE TIME A DAY       ZYRTEC ALLERGY 10 MG tablet   Generic drug:  cetirizine         STOP taking these medications          acetic acid-hydrocortisone 1-2 % otic solution   Commonly known as:  VOSOL-HC       ciprofloxacin 500 MG tablet   Commonly known as:  CIPRO       hydrALAZINE 25 MG tablet   Commonly known as:  APRESOLINE       predniSONE 10 MG tablet   Commonly known as:  DELTASONE       spironolactone 25 MG tablet   Commonly known as:  ALDACTONE             Discharge recommendations given to patient.  Follow Up. pcp in 1 week ;cardiology 2 weeks; pain management as scheduled  Disposition.  home  Activity. activity as tolerated  Diet: DIET CARDIAC; Low Sodium (2 GM)          Signed:  Alyson Locket, MD     06/17/2016 12:50 PM

## 2016-06-18 LAB — POTASSIUM: Potassium: 3.8 mmol/L (ref 3.5–5.1)

## 2016-06-18 MED FILL — NORMAL SALINE FLUSH 0.9 % IV SOLN: 0.9 % | INTRAVENOUS | Qty: 10

## 2016-06-18 MED FILL — EFFIENT 10 MG PO TABS: 10 MG | ORAL | Qty: 1

## 2016-06-18 MED FILL — MAGNESIUM HYDROXIDE 400 MG/5ML PO SUSP: 400 MG/5ML | ORAL | Qty: 30

## 2016-06-18 MED FILL — FUROSEMIDE 40 MG PO TABS: 40 MG | ORAL | Qty: 2

## 2016-06-18 MED FILL — LIPITOR 80 MG PO TABS: 80 MG | ORAL | Qty: 1

## 2016-06-18 MED FILL — ASPIRIN EC 81 MG PO TBEC: 81 MG | ORAL | Qty: 1

## 2016-06-18 MED FILL — LISINOPRIL 20 MG PO TABS: 20 MG | ORAL | Qty: 2

## 2016-06-18 MED FILL — EFFEXOR XR 150 MG PO CP24: 150 MG | ORAL | Qty: 1

## 2016-06-18 MED FILL — BUSPIRONE HCL 5 MG PO TABS: 5 MG | ORAL | Qty: 2

## 2016-06-18 MED FILL — TRAZODONE HCL 50 MG PO TABS: 50 MG | ORAL | Qty: 2

## 2016-06-18 MED FILL — METOPROLOL TARTRATE 50 MG PO TABS: 50 MG | ORAL | Qty: 2

## 2016-06-18 MED FILL — OXYCODONE-ACETAMINOPHEN 5-325 MG PO TABS: 5-325 MG | ORAL | Qty: 1

## 2016-06-18 MED FILL — SINGULAIR 10 MG PO TABS: 10 MG | ORAL | Qty: 1

## 2016-06-18 MED FILL — LOVENOX 40 MG/0.4ML SC SOLN: 40 MG/0.4ML | SUBCUTANEOUS | Qty: 0.4

## 2016-06-18 MED FILL — ONDANSETRON HCL 4 MG/2ML IJ SOLN: 4 MG/2ML | INTRAMUSCULAR | Qty: 2

## 2016-06-18 NOTE — Progress Notes (Signed)
Shift assessment completed. Pt is a/o X4. VSS. POC discussed and all questions answered. Pt has belongings and call light in reach. Denies further needs, will continue to monitor.

## 2016-06-18 NOTE — Progress Notes (Signed)
Data- discharge order received, pt verbalized agreement to discharge, disposition to previous residence, no needs for HHC/DME.     Action- discharge instructions prepared and given to patient, pt verbalized understanding. Medication information packet given r/t NEW and/or CHANGED prescriptions emphasizing name/purpose/side effects, pt verbalized understanding. Discharge instruction summary: Diet- PTA, Activity- PTA, Primary Care Physician as follows: Acquanetta Sit SLOUGH, MD [phone] f/u appointment to be made by pt at their convenience, immunizations reviewed and unchanged, prescription medications n/a. n/asurgical/procedural precautions at d/c.    Response- Pt belongings gathered, IV removed. Disposition is home (no HHC/DME needs), transported with daughter, taken to lobby via w/c w/ RN, no complications.

## 2016-06-18 NOTE — Progress Notes (Signed)
Patient placed on Vision BIPAP at this time. Settings are 12 IPAP and 6 EPAP on RA.

## 2016-06-18 NOTE — Progress Notes (Signed)
Patient not ready to be placed on overnight BIPAP at this time. Patient is awake on room air. Will continue to monitor.

## 2016-06-19 NOTE — Care Coordination-Inpatient (Signed)
[place] Transitions Initial Follow Up Call    Call within 2 business days of discharge: Yes    Patient: Rachael Mays Patient DOB: 10/09/1956   MRN: [id number]  Reason for Admission: chest pain  Discharge Date: 06/18/16 RARS: Geisinger Risk Score: 23.75     Spoke with: husband, Immunologist, pt sleeping    Facility:[facility]    Non-face-to-face services provided:  Obtained and reviewed discharge summary and/or continuity of care documents    Inpatient Assessment  Care Transitions 24 Hour Call    Do you have any ongoing symptoms?:  No   Do you have a copy of your discharge instructions?:  Yes   Do you have all of your prescriptions and are they filled?:  Yes   Have you been contacted by a [place]?:  No   Have you scheduled your follow up appointment?:  Yes   How are you going to get to your appointment?:  Car - family or friend to transport   Were you discharged with any Home Care or Post Acute Services:  No         Do you have support at home?:  Partner/Spouse/SO   Do you feel like you have everything you need to keep you well at home?:  Yes   Are you an active caregiver in your home?:  No   Care Transitions Interventions   No Identified Needs                        pt's husband states she is doing well, no further chest pain, f/u appts below. Agreed to further CTC f/u calls    Follow Up  Future Appointments  Date Time Provider [place]   06/22/2016 3:30 PM Harvel Quale, MD [facility]   06/23/2016 3:00 PM Connye Burkitt, MD [facility]   06/25/2016 11:30 AM Francisca December, MD [facility]   07/07/2016 1:15 PM Leodis Binet, MD [facility]   07/28/2016 10:00 AM Tobin Chad, MD [facility]   11/10/2016 12:20 PM Tiffanie Rita Ohara, CNP [facility]       Iver Nestle, RN

## 2016-06-19 NOTE — Telephone Encounter (Signed)
I spoke with patient and she states that her lasix was decreased and her potassium was stopped at discharge after her admit to [facility] 7/11-7/12/17. She currently denies chest pain, shortness of breath or weight gain since discharge. Overall she feels okay.  Per her discharge note patient was admitted with chest pain.  Has had recurrent CP since stenting in April. Readmitted again with CP.  Cardiology evaluation recommended med adjustments but no intervention.  It was thought that  anxiety was also contributing to some to her pain as her benzo helps some.  I spoke with [facility] and she gave instructions to have patient monitor symptoms and call if she has more swelling, shortness of breath, chest pain or weight gain. She is to follow Dr.Puri medication change. She has an appointment in 2 weeks with Dr.cochran and wants to keep it  But will agree to coming in earlier if she has a problem. She verbalizes understanding.

## 2016-06-19 NOTE — Care Coordination-Inpatient (Signed)
[place] Transitions Initial Follow Up Call    Call within 2 business days of discharge: Yes    Patient: Rachael Mays Patient DOB: 07/24/1956   MRN: [id number]  Reason for Admission: chest pain  Discharge Date: 06/18/16 RARS: Geisinger Risk Score: 23.75     Initial 24hr transition call attempted, left contact info on vm    Follow Up  Future Appointments  Date Time Provider [place]   06/22/2016 3:30 PM Harvel Quale, MD [facility]   06/23/2016 3:00 PM Connye Burkitt, MD [facility]   06/25/2016 11:30 AM Francisca December, MD [facility]   07/07/2016 1:15 PM Leodis Binet, MD [facility]   07/28/2016 10:00 AM Tobin Chad, MD [facility]   11/10/2016 12:20 PM Tiffanie Rita Ohara, CNP [facility]       Iver Nestle, RN

## 2016-06-19 NOTE — Telephone Encounter (Signed)
 Patient have questions regarding her medication potassium would like for karen to give her a call pls call to advise

## 2016-06-22 ENCOUNTER — Ambulatory Visit
Admit: 2016-06-22 | Discharge: 2016-06-22 | Payer: PRIVATE HEALTH INSURANCE | Attending: Family Medicine | Primary: Sports Medicine

## 2016-06-22 DIAGNOSIS — R079 Chest pain, unspecified: Secondary | ICD-10-CM

## 2016-06-22 MED ORDER — POTASSIUM CHLORIDE CRYS ER 20 MEQ PO TBCR
20 MEQ | ORAL_TABLET | Freq: Two times a day (BID) | ORAL | 3 refills | Status: DC
Start: 2016-06-22 — End: 2016-07-17

## 2016-06-22 MED ORDER — ALPRAZOLAM 0.25 MG PO TABS
0.25 | ORAL_TABLET | Freq: Two times a day (BID) | ORAL | 0 refills | Status: DC | PRN
Start: 2016-06-22 — End: 2017-01-27

## 2016-06-22 NOTE — Progress Notes (Signed)
Subjective:      Patient ID: Rachael Mays is a 60 y.o. female.    HPI  She presents for hospital follow-up. She was admitted from 06/16/2016 through 06/19/2013. Her chief complaint on admission was chest pain. BMP was slightly elevated on admission at 216. Her troponin was normal ??3. Basic metabolic panel and CBC were within normal limits. Cholesterol was very good. Patient had a chest x-ray that was normal. She had a CT of her chest rule out PE which was negative. Patient was seen by cardiology in consultation. Her hydralazine and spironolactone were discontinued as her blood pressure during admission was around 098 systolic. Her Lasix was increased to 80 mg each morning and 40 mg each evening. She has not been taking any potassium at home.     Patient continues to struggle with anxiety. Xanax seems to help. She did not have any significant relief with BuSpar.  Review of Systems    Patient Active Problem List   Diagnosis   ??? Back pain   ??? Deep vein thrombosis ([place])   ??? Depression   ??? Hyperlipidemia   ??? Pulmonary embolism ([place])   ??? Nausea & vomiting   ??? Abdominal  pain, other specified site   ??? Morbid obesity due to excess calories ([place])   ??? Status post gastric banding   ??? Vertigo   ??? Dizziness   ??? Tinnitus, subjective   ??? Other complications of gastric band procedure   ??? Essential hypertension   ??? Hyperglycemia   ??? Atypical chest pain   ??? 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 Adrenal mass, left ([place])   ??? Adrenal nodule ([place])   ??? Obstructive sleep apnea (adult) (pediatric)   ??? Dyspnea and respiratory abnormalities   ??? Abnormal stress test   ??? Shortness of breath   ??? Acute diastolic congestive heart failure ([place])   ??? Coronary artery disease due to lipid rich plaque   ??? Pulmonary hypertension ([place])   ??? Unstable angina ([place])   ??? 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 Chest pain   ??? Acute actinic otitis externa of both ears   ??? Bilateral acute serous otitis media        Outpatient Prescriptions Marked as Taking for the 06/22/16 encounter (Office Visit) with Harvel Quale, MD   Medication Sig Dispense Refill   ??? venlafaxine (EFFEXOR XR) 150 MG extended release capsule TAKE ONE CAPSULE BY MOUTH ONE TIME A DAY 90 capsule 1   ??? furosemide (LASIX) 40 MG tablet Take 80 mg in am and 40 mg in pm 180 tablet 3   ??? nystatin (MYCOSTATIN) 100000 UNIT/GM cream Apply topically 2 times daily. 30 g 2   ??? atorvastatin (LIPITOR) 80 MG tablet Take 1 tablet by mouth daily 90 tablet 3   ??? lisinopril (PRINIVIL;ZESTRIL) 40 MG tablet Take 1 tablet by mouth daily Indications: Patient thought this was dc'd in December by PCP 90 tablet 3   ??? omeprazole (PRILOSEC) 40 MG delayed release capsule Take 1 capsule by mouth daily 90 capsule 3   ??? busPIRone (BUSPAR) 10 MG tablet Take 1 tablet by mouth 3 times daily 270 tablet 3   ??? metoprolol (LOPRESSOR) 100 MG tablet TAKE TAKE 1 AND 1/2 TABLET BY MOUTH TABLETS BY MOUTH TWO TIMES A DAY 270 tablet 1   ??? oxyCODONE-acetaminophen (PERCOCET) 5-325 MG per tablet Take 1 tablet by mouth every 6 hours as needed  for Pain . 40 tablet 0   ??? nitroGLYCERIN (NITROSTAT) 0.4 MG SL tablet up to max of 3 total doses. If no relief after 1 dose, call 911. 25 tablet 3   ??? aspirin 81 MG EC tablet Take 1 tablet by mouth daily 30 tablet 11   ??? prasugrel (EFFIENT) 10 MG TABS Take 1 tablet by mouth daily 90 tablet 3   ??? triamcinolone (KENALOG) 0.1 % cream Apply to affected areas twice daily for up to 2 weeks or until improved. 80 g 1   ??? ALPRAZolam (XANAX) 0.25 MG tablet Take 1 tablet by mouth 2 times daily as needed for Anxiety 60 tablet 0   ??? montelukast (SINGULAIR) 10 MG tablet TAKE ONE TABLET BY MOUTH ONE TIME A DAY 90 tablet 1   ??? traZODone (DESYREL) 100 MG tablet TAKE 1 TABLET BY MOUTH NIGHTLY (Patient taking differently: TAKE 1/2 TABLET BY MOUTH NIGHTLY) 90 tablet 1   ??? albuterol sulfate HFA (PROVENTIL HFA) 108 (90 BASE) MCG/ACT inhaler Inhale 2 puffs into the lungs every  6 hours as needed for Wheezing 3 Inhaler 3   ??? Blood Pressure Monitor KIT Use daily as needed 1 kit 0   ??? ondansetron (ZOFRAN) 4 MG tablet Take 1 tablet by mouth daily as needed for Nausea or Vomiting 30 tablet 1   ??? Multiple Vitamins-Minerals (MULTIVITAMIN PO) Take by mouth     ??? CALCIUM-VITAMIN D PO Take by mouth     ??? cetirizine (ZYRTEC ALLERGY) 10 MG tablet Take 10 mg by mouth daily.         Allergies   Allergen Reactions   ??? Morphine Itching   ??? Talwin [Pentazocine] Other (See Comments)     dizzy       Social History   Substance Use Topics   ??? Smoking status: Former Smoker     Packs/day: 0.50     Years: 40.00     Types: Cigarettes     Quit date: 08/07/2012   ??? Smokeless tobacco: Former Systems developer     Quit date: 06/16/2013      Comment: started to smoke at age 58 / only smoked 0.5 p.p.d    ??? Alcohol use 0.0 oz/week     0 Standard drinks or equivalent per week      Comment: occasionally        Objective:   BP 130/72 (Site: Left Arm, Position: Sitting, Cuff Size: Large Adult)   Pulse 62   Wt (!) 304 lb 6.4 oz (138.1 kg)   SpO2 98%   BMI 49.13 kg/m2    Physical Exam   Constitutional: She is oriented to person, place, and time. She appears well-developed and well-nourished. No distress.   Cardiovascular: Normal rate, regular rhythm and normal heart sounds.    No murmur heard.  Trace to 1+ edema bilateral lower extremities.   Pulmonary/Chest: Effort normal and breath sounds normal. She has no wheezes. She has no rales.   Neurological: She is alert and oriented to person, place, and time.   Psychiatric: She has a normal mood and affect. Her behavior is normal.       Assessment:     1. Chest pain, unspecified type     2. Anxiety  ALPRAZolam (XANAX) 0.25 MG tablet     Plan:   Patient's chest pain from cardiology report thought to be related to small vessel disease. She has follow-up scheduled with Dr. Susy Manor next week. For now, we'll continue medications as ordered  at discharge but I will add potassium supplement 40 mEq twice a  day. Will check BMP this week to follow kidney function and electrolytes.  Xanax refilled.   Controlled Substances Monitoring: Attestation: The Prescription Monitoring Report for this patient was reviewed today. Helene Kelp M Rayford Halsted, MD)  Documentation: Possible medication side effects, risk of tolerance and/or dependence, and alternative treatments discussed, No signs of potential drug abuse or diversion identified. [facility] Rayford Halsted, MD)

## 2016-06-22 NOTE — Progress Notes (Signed)
Patient is here for a hospital follow-up from [facility] chest pain.  Patient states that she feels very weak, light-headed, dizzy, blurred vision.   Patient is not currently taking any OTC medications.  Patient also complains of abdominal pain for one week.  Patient states that she feels nauseas.  Patient also complains of swelling in both legs and feet.  Patient states that her legs feel tight. Patient wants a refill on her Xanax.

## 2016-06-23 ENCOUNTER — Encounter: Attending: Cardiovascular Disease | Primary: Sports Medicine

## 2016-06-24 NOTE — Care Coordination-Inpatient (Signed)
[place] [facility] Follow Up Call    06/24/2016    Patient: Rachael Mays  Patient DOB: [birth year]   MRN: [id number]  Reason for Admission: chest pain  Discharge Date: 06/18/16 RARS: Risk Score: 23.75       F/u call attempted, left contact info on vm    Follow Up  Future Appointments  Date Time Provider [place]   06/25/2016 11:30 AM Francisca December, MD [facility]   07/07/2016 1:15 PM Leodis Binet, MD [facility]   07/28/2016 10:00 AM Tobin Chad, MD [facility]   11/10/2016 12:20 PM Tiffanie Rita Ohara, CNP [facility]       Iver Nestle, RN

## 2016-06-25 ENCOUNTER — Ambulatory Visit
Admit: 2016-06-25 | Discharge: 2016-06-25 | Payer: PRIVATE HEALTH INSURANCE | Attending: Dermatology | Primary: Sports Medicine

## 2016-06-25 DIAGNOSIS — L219 Seborrheic dermatitis, unspecified: Secondary | ICD-10-CM

## 2016-06-25 NOTE — Care Coordination-Inpatient (Signed)
[place] [facility] Follow Up Call    06/25/2016    Patient: Rachael Mays  Patient DOB: [birth year]   MRN: [id number]  Reason for Admission: chest pain  Discharge Date: 06/18/16 RARS: Risk Score: 23.75       Spoke with: [facility] [place] [facility] Subsequent and Final Call    Subsequent and Final Calls   Do you have any ongoing symptoms?:  Yes   Onset of Patient-reported symptoms:  Other   Patient-reported symptoms:  Chest Pain   Have your medications changed?:  No   Do you have any questions related to your medications?:  No   Do you currently have any active services?:  No   Do you have any needs or concerns that I can assist you with?:  No   Identified Barriers:  None   Care Transitions Interventions   No Identified Needs   Other Interventions:                          pt states still having intermittent chest pain, doctors can not seem to know why according to pt, so feeling a little frustrated, f/u again with cardiologist 8/1. Has seen PCP. Agreed to another CTC f/u call next week    Follow Up  Future Appointments  Date Time Provider [place]   07/07/2016 1:15 PM Leodis Binet, MD [facility]   07/28/2016 10:00 AM Tobin Chad, MD [facility]   11/10/2016 12:20 PM Tiffanie Rita Ohara, CNP [facility]       Iver Nestle, RN

## 2016-06-25 NOTE — Progress Notes (Signed)
[facility]  Antonietta Breach, MD  [place]  1955/12/18    60 y.o. female     Date of Visit: 06/25/2016    Chief Complaint: seborrheic dermatitis, intertrigo    History of Present Illness:    1.  F/u for seborrheic dermatitis of the scalp - reports good control with intermittent use of Lidex solution.     2.  Follow up for intertrigo below the breasts and in the groin - improved with nystatin cream.  Initially improved but subsequently worsened with nystatin.        Review of Systems:  Gen: Feels well, good sense of health.    Past Medical History, Family History, Surgical History, Medications and Allergies reviewed.    Past Medical History:   Diagnosis Date   ??? Anxiety    ??? Asthma    ??? Blood circulation, collateral    ??? CAD (coronary artery disease)    ??? CHF (congestive heart failure) ([place])    ??? Chronic back pain    ??? Deep vein thrombosis    ??? Depression    ??? GERD (gastroesophageal reflux disease)     NO LONGER SINCE LAP   ??? GERD (gastroesophageal reflux disease) 01/23/2009   ??? Hx of blood clots    ??? Hyperlipidemia     hx; resolved with lap band   ??? Hypertension    ??? Obesity     hx of; had lap band   ??? Obstructive sleep apnea (adult) (pediatric) 09/18/2015   ??? Pulmonary embolism [facility])      Past Surgical History:   Procedure Laterality Date   ??? BACK SURGERY      neck  plates   ??? BREAST SURGERY      left lumpectomy   ??? CESAREAN SECTION     ??? CHOLECYSTECTOMY     ??? COLONOSCOPY     ??? COLONOSCOPY  04/08/10   ??? CORONARY ANGIOPLASTY     ??? DIAGNOSTIC CARDIAC CATH LAB PROCEDURE     ??? DILATATION, ESOPHAGUS     ??? ENDOSCOPY, COLON, DIAGNOSTIC     ??? HYSTERECTOMY     ??? LAP BAND  05/01/08    Dr. Nicole Cella   ??? OTHER SURGICAL HISTORY      [place] Filter: curently in place   ??? OTHER SURGICAL HISTORY  07/05/13    lap band removal   ??? PARTIAL HYSTERECTOMY     ??? SHOULDER SURGERY     ??? UPPER GASTROINTESTINAL ENDOSCOPY  05/19/13   ??? UPPER GASTROINTESTINAL ENDOSCOPY  07/21/13    ESOPHAGEAL STENT  PLACEMENT   ??? UPPER GASTROINTESTINAL ENDOSCOPY N/A 07/31/2014    Esophagogastroduodenoscopy with esophageal balloon dilation   ??? VASCULAR SURGERY  04/2015    Lucendia Herrlich, celiac artery angiogram, normal abd arteries       Allergies   Allergen Reactions   ??? Morphine Itching   ??? Talwin [Pentazocine] Other (See Comments)     dizzy     Outpatient Prescriptions Marked as Taking for the 06/25/16 encounter (Office Visit) with Francisca December, MD   Medication Sig Dispense Refill   ??? ALPRAZolam (XANAX) 0.25 MG tablet Take 1 tablet by mouth 2 times daily as needed for Anxiety 60 tablet 0   ??? potassium chloride (KLOR-CON M) 20 MEQ extended release tablet Take 2 tablets by mouth 2 times daily 120 tablet 3   ??? venlafaxine (EFFEXOR XR) 150 MG extended release capsule TAKE ONE  CAPSULE BY MOUTH ONE TIME A DAY 90 capsule 1   ??? furosemide (LASIX) 40 MG tablet Take 80 mg in am and 40 mg in pm 180 tablet 3   ??? nystatin (MYCOSTATIN) 100000 UNIT/GM cream Apply topically 2 times daily. 30 g 2   ??? atorvastatin (LIPITOR) 80 MG tablet Take 1 tablet by mouth daily 90 tablet 3   ??? lisinopril (PRINIVIL;ZESTRIL) 40 MG tablet Take 1 tablet by mouth daily Indications: Patient thought this was dc'd in December by PCP 90 tablet 3   ??? omeprazole (PRILOSEC) 40 MG delayed release capsule Take 1 capsule by mouth daily 90 capsule 3   ??? metoprolol (LOPRESSOR) 100 MG tablet TAKE TAKE 1 AND 1/2 TABLET BY MOUTH TABLETS BY MOUTH TWO TIMES A DAY 270 tablet 1   ??? oxyCODONE-acetaminophen (PERCOCET) 5-325 MG per tablet Take 1 tablet by mouth every 6 hours as needed for Pain . 40 tablet 0   ??? nitroGLYCERIN (NITROSTAT) 0.4 MG SL tablet up to max of 3 total doses. If no relief after 1 dose, call 911. 25 tablet 3   ??? aspirin 81 MG EC tablet Take 1 tablet by mouth daily 30 tablet 11   ??? prasugrel (EFFIENT) 10 MG TABS Take 1 tablet by mouth daily 90 tablet 3   ??? triamcinolone (KENALOG) 0.1 % cream Apply to affected areas twice daily for up to 2 weeks or until improved. 80 g 1    ??? montelukast (SINGULAIR) 10 MG tablet TAKE ONE TABLET BY MOUTH ONE TIME A DAY 90 tablet 1   ??? traZODone (DESYREL) 100 MG tablet TAKE 1 TABLET BY MOUTH NIGHTLY (Patient taking differently: TAKE 1/2 TABLET BY MOUTH NIGHTLY) 90 tablet 1   ??? albuterol sulfate HFA (PROVENTIL HFA) 108 (90 BASE) MCG/ACT inhaler Inhale 2 puffs into the lungs every 6 hours as needed for Wheezing 3 Inhaler 3   ??? Blood Pressure Monitor KIT Use daily as needed 1 kit 0   ??? ondansetron (ZOFRAN) 4 MG tablet Take 1 tablet by mouth daily as needed for Nausea or Vomiting 30 tablet 1   ??? Multiple Vitamins-Minerals (MULTIVITAMIN PO) Take by mouth     ??? CALCIUM-VITAMIN D PO Take by mouth     ??? cetirizine (ZYRTEC ALLERGY) 10 MG tablet Take 10 mg by mouth daily.         Physical Examination       The following were examined and determined to be normal: Psych/Neuro, Scalp/hair, Head/face, Conjunctivae/eyelids, Gums/teeth/lips and Neck.    The following were examined and determined to be abnormal: Breast/axilla/chest and Genitalia/groin/buttocks.     Well appearing.    1.  Scalp clear.      2.  Inframammary region >> inguinal region - few small macerated erythematous patches with few papules.         Assessment and Plan     1. Seborrheic dermatitis of the scalp - under good control/improved    Continue intermittent Lidex solution as needed.      2. Intertrigo with likely candidal infection - improved    Nystatin and triamcinolone cream twice daily for flares.    Try Zeasorb AF powder daily.          Return in about 6 months (around 12/26/2016).

## 2016-06-30 LAB — BASIC METABOLIC PANEL
Anion Gap: 12 (ref 3–16)
BUN: 14 mg/dL (ref 7–20)
CO2: 28 mmol/L (ref 21–32)
Calcium: 10 mg/dL (ref 8.3–10.6)
Chloride: 102 mmol/L (ref 99–110)
Creatinine: 0.8 mg/dL (ref 0.6–1.1)
GFR African American: >60 (ref >60)
GFR Non-African American: >60 (ref >60)
Glucose: 119 mg/dL — ABNORMAL HIGH (ref 70–99)
Potassium: 4.4 mmol/L (ref 3.5–5.1)
Sodium: 142 mmol/L (ref 136–145)

## 2016-06-30 LAB — MAGNESIUM: Magnesium: 2.1 mg/dL (ref 1.80–2.40)

## 2016-07-07 ENCOUNTER — Ambulatory Visit
Admit: 2016-07-07 | Discharge: 2016-07-07 | Payer: PRIVATE HEALTH INSURANCE | Attending: Cardiovascular Disease | Primary: Sports Medicine

## 2016-07-07 DIAGNOSIS — I2583 Coronary atherosclerosis due to lipid rich plaque: Secondary | ICD-10-CM

## 2016-07-07 NOTE — Progress Notes (Signed)
[place]   Cardiac Follow up    Referring Provider:  Acquanetta Sit SLOUGH, MD     Chief Complaint   Patient presents with   ??? Follow-up     6 week    ??? Coronary Artery Disease   ??? Hypertension   ??? Congestive Heart Failure        History of Present Illness:  Rachael Mays presents for follow up for chest pain and shortness of breath. She had normal coronaries angiogram in October 2016 and June 2013.  She underwent repeat cardiac cath in April 2017 and at that time was noted to have significant stenosis in the mid LAD which was revascularized with drug-eluting stent implantation.  This had no impact on her chest pain in terms of resolution.  She has morbid obesity and underwent lap band surgery.  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  She was seen by Dr. Jeb Levering who did a follow up CTA of her chest and CTPA  for continued shortness of breath 03/25/16 that showed no evidence of pulmonary embolism or acute pulmonary abnormality. A chest xray on the same day suggested congestive heart failure.  Her diuretics were increased and her swelling improved but she essentially had no improvement in her shortness of breath and chest pain.  The chest pain did have a component of tenderness on palpation in the past.  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  It was hypothesized that this may be contributing to the left-sided chest discomfort.  She has been seen by Dr. Diana Eves, pain management.  She was hospitalized 06/16/16-06/18/16 for chest pain.    Today, she is post hospitalization for chest pain.  She ruled out for MI.  She underwent medication adjustment which helped some.  She continues to have chest discomfort which is not has bad as before her hospitalization.  The chest pain is left sided, sharp in nature, no reproducible/identifiable trigger.  NTG sl seems to help her symptoms.  She uses Bi-PAP for sleep apnea,  but is still very fatigued.  She is trying to get back into a walking routine by walking in her neighborhood and also works out in her yard.  She is followed by Dr Jacqualyn Posey for management of diastolic heart failure.      Past Medical History:   has a past medical history of Anxiety; Asthma; Blood circulation, collateral; CAD (coronary artery disease); CHF (congestive heart failure) ([place]); Chronic back pain; Deep vein thrombosis; Depression; GERD (gastroesophageal reflux disease); GERD (gastroesophageal reflux disease); Hx of blood clots; Hyperlipidemia; Hypertension; Obesity; Obstructive sleep apnea (adult) (pediatric); and Pulmonary embolism ([place]).    Surgical History:   has a past surgical history that includes Colonoscopy; Cesarean section; Cholecystectomy; Hysterectomy; shoulder surgery; Lap Band (05/01/08); back surgery; Colonoscopy (04/08/10); other surgical history; Upper gastrointestinal endoscopy (05/19/13); other surgical history (07/05/13); Upper gastrointestinal endoscopy (07/21/13); partial hysterectomy (cervix not removed); Upper gastrointestinal endoscopy (N/A, 07/31/2014); vascular surgery (04/2015); Diagnostic Cardiac Cath Lab Procedure; Coronary angioplasty; Endoscopy, colon, diagnostic; Breast surgery; and Dilatation, esophagus.     Social History:   reports that she quit smoking about 3 years ago. Her smoking use included Cigarettes. She has a 20.00 pack-year smoking history. She quit smokeless tobacco use about 3 years ago. She reports that she drinks alcohol. She reports that she does not use illicit drugs.     Family History:  family  history includes Arthritis in her mother; Cancer in her mother; Cancer (age of onset: 63) in her father; Depression in her mother; Diabetes in an other family member; Early Death in her paternal grandmother; Heart Disease in her maternal grandmother and paternal grandfather; Heart Disease (age of onset: 9) in her mother; High Blood Pressure in her mother and another  family member; Obesity in an other family member; Other in her brother, brother, daughter, and sister; Ovarian Cancer (age of onset: 86) in her mother.     Home Medications:  Prior to Admission medications    Medication Sig Start Date End Date Taking? Authorizing Provider   ALPRAZolam Duanne Moron) 0.25 MG tablet Take 1 tablet by mouth 2 times daily as needed for Anxiety 06/22/16  Yes Harvel Quale, MD   potassium chloride (KLOR-CON M) 20 MEQ extended release tablet Take 2 tablets by mouth 2 times daily 06/22/16  Yes Harvel Quale, MD   venlafaxine (EFFEXOR XR) 150 MG extended release capsule TAKE ONE CAPSULE BY MOUTH ONE TIME A DAY 06/10/16  Yes Harvel Quale, MD   furosemide (LASIX) 40 MG tablet Take 80 mg in am and 40 mg in pm 05/21/16  Yes Maebelle Munroe Helferich, CNP   nystatin (MYCOSTATIN) 100000 UNIT/GM cream Apply topically 2 times daily. 05/05/16  Yes Francisca December, MD   atorvastatin (LIPITOR) 80 MG tablet Take 1 tablet by mouth daily 04/30/16  Yes Leodis Binet, MD   lisinopril (PRINIVIL;ZESTRIL) 40 MG tablet Take 1 tablet by mouth daily Indications: Patient thought this was dc'd in December by PCP 04/09/16  Yes Harvel Quale, MD   omeprazole [facility]) 40 MG delayed release capsule Take 1 capsule by mouth daily 04/09/16  Yes Harvel Quale, MD   busPIRone (BUSPAR) 10 MG tablet Take 1 tablet by mouth 3 times daily 04/09/16  Yes Harvel Quale, MD   metoprolol (LOPRESSOR) 100 MG tablet TAKE TAKE 1 AND 1/2 TABLET BY MOUTH TABLETS BY MOUTH TWO TIMES A DAY 04/08/16  Yes Harvel Quale, MD   oxyCODONE-acetaminophen (PERCOCET) 5-325 MG per tablet Take 1 tablet by mouth every 6 hours as needed for Pain . 04/07/16  Yes Bhumit Patel, MD   nitroGLYCERIN (NITROSTAT) 0.4 MG SL tablet up to max of 3 total doses. If no relief after 1 dose, call 911. 04/06/16  Yes Bhumit Patel, MD   aspirin 81 MG EC tablet Take 1 tablet by mouth daily 04/02/16  Yes Leodis Binet, MD   prasugrel  (EFFIENT) 10 MG TABS Take 1 tablet by mouth daily 04/02/16  Yes Leodis Binet, MD   triamcinolone (KENALOG) 0.1 % cream Apply to affected areas twice daily for up to 2 weeks or until improved. 03/23/16  Yes Francisca December, MD   montelukast (SINGULAIR) 10 MG tablet TAKE ONE TABLET BY MOUTH ONE TIME A DAY 03/18/16  Yes Jamal Collin, MD   traZODone (DESYREL) 100 MG tablet TAKE 1 TABLET BY MOUTH NIGHTLY  Patient taking differently: TAKE 1/2 TABLET BY MOUTH NIGHTLY 01/02/16  Yes Harvel Quale, MD   albuterol sulfate HFA (PROVENTIL HFA) 108 (90 BASE) MCG/ACT inhaler Inhale 2 puffs into the lungs every 6 hours as needed for Wheezing 12/27/15  Yes Harvel Quale, MD   ondansetron [facility]) 4 MG tablet Take 1 tablet by mouth daily as needed for Nausea or Vomiting 06/03/15  Yes Harvel Quale, MD  Multiple Vitamins-Minerals (MULTIVITAMIN PO) Take by mouth   Yes Historical Provider, MD   CALCIUM-VITAMIN D PO Take by mouth   Yes Historical Provider, MD   cetirizine (ZYRTEC ALLERGY) 10 MG tablet Take 10 mg by mouth daily.   Yes Historical Provider, MD   Blood Pressure Monitor KIT Use daily as needed 12/20/15   Harvel Quale, MD        Allergies:  Morphine and Talwin [pentazocine]     Review of Systems:   A 10 point review of systems is negative except as noted in the history of present illness.      Physical Examination:    Vitals:    07/07/16 1410   BP: 132/80   Pulse: 64        Constitutional and General Appearance: NAD   Respiratory:  ?? Normal excursion and expansion without use of accessory muscles  ?? Resp Auscultation: Normal breath sounds without dullness  Cardiovascular:  ?? The apical impulses not displaced  ?? JVD is normal  ?? The carotid upstroke is normal in amplitude and contour without delay or bruit  ?? Normal S1S2, No S3, No Murmur, No rub  ?? Peripheral pulses are symmetrical and full  ?? There is no clubbing, cyanosis of the extremities.  ?? Trace edema  ?? Pedal Pulses: 2+ and  equal   Abdomen:  ?? No masses or tenderness  ?? Liver/Spleen: No Abnormalities Noted  Neurological/Psychiatric:  ?? Alert and oriented in all spheres  ?? Moves all extremities well  ?? Exhibits normal gait balance and coordination  ?? No abnormalities of mood, affect, memory, mentation, or behavior are noted    CTPA 06/16/16:  No evidence of pulmonary embolism.   ??   No acute abnormality.     Cardiac monitor 06/01/16:  Predominantly sinus rhythm with occasional PACs and PVCs    Echo 04/06/16:  Summary  ??Normal left ventricle size and systolic function with an estimated ejection??fraction of 65%.  ??There is mild concentric left ventricular hypertrophy.  ??Mild mitral regurgitation is present.  ??The aortic valve appears sclerotic but opens well.  ??There is mild tricuspid regurgitation with RVSP estimated at 42 mmHg.  ??Mild pulmonic regurgitation present.  ??Definity was used to better delineate endocardium.    Cardiac Cath 03/31/16:  Anatomy:   LM-normal   LAD-70% mid with FFR 0.77  Cx-normal, codominant  OM1- normal  RCA-normal  RPDA- normal  LVEF- 70%, LVEDP 19   PCI: LAD 70% to 0% with 3.25 mm x 18 mm Xience Alpine drug-eluting stent     Hemodynamics:  RA-15/12 mean 9  RV- 60/0, 9  PAWP-20/26 mean 19  PA- 60/12 mean 33        Impression:  1.  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  2.  Hyperdynamic LV systolic function.  3.  Mildly decompensated diastolic CHF.  4.  Moderate pulmonary hypertension.     Plan:  1.  Aspirin indefinitely.  2.  Effient/equivalent for minimum of 1 year.  3.  IV diuresis while in the hospital and then discharged on Lasix 40 mg by mouth twice a day.  4.  Continue BiPAP therapy.  5.  Outpatient cardiac rehab.  6.  CHF service follow-up on discharge.      CTPA 03/25/16:  No evidence of pulmonary embolism or acute pulmonary abnormality.    Chest x-ray 03/25/16:  No acute process.    ECG 25-Mar-2016 17:28:21 [place]:  Sinus bradycardia  Otherwise normal  ECG  No significant change    CTPA 03/10/16:  No evidence of pulmonary embolus.   Small right and trace left pleural effusions, new as well as interlobular   septal wall thickening. ??Findings likely related to edema.   Mild mediastinal adenopathy, new. ??Findings may be reactive. ??Recommend   follow-up to ensure resolution.         Chest x-ray 02/13/16:  FINDINGS:   Cardiomegaly. ??There appears be pulmonary vascular congestion and   interstitial edema. ??No focal airspace disease.   ??     ??   Impression   Findings suggest congestive heart failure   ??     Echo 10/07/15:  Summary  ??Technically limited study due to body habitus.  ??Normal left ventricle size and systolic function with an estimated??ejection fraction of 55%. No regional wall motion abnormalities are seen.  ??There is mild concentric left ventricular hypertrophy.    Cardiac Cath 09/20/15:  Anatomy:   LM-normal  LAD-normal  Cx-normal  OM1- normal  RCA-normal, codominant  RPDA- normal  LVEF- 60%  ??  Impression:  1. Normal coronaries  2. Normal LV systolic function  3. False positive stress test      Myoview Stress Test  09/18/15:  Summary   ??-Moderate sized anterior mostly reversible defect consistent with ischemia   ??in the territory of the mid LAD .   ??-Normal LV function.   ??-Study is limited by breast attenuation.       Cardiac cath 06/03/12:  Normal coronaries and LVEF       Assessment:   1. Coronary artery disease due to lipid rich plaque    2. Status post insertion of drug-eluting stent into left anterior descending (LAD) artery    3. Pulmonary hypertension ([place])    4. Chronic diastolic congestive heart failure ([place])    5. Hyperlipidemia, unspecified hyperlipidemia type    6. Atypical chest pain        Plan:  Ms. Gathers continues to have intermittent chest pain with no clear etiology.  Discomfort improves with use of NTG sl.  She has a history of CAD, but her symptoms are atypical and possibly related to GI source such as esophageal spasm. Lipids are well  controlled (LDL 52) in July 2017.   1.  No change in medication  2.  Risk factor modification discussed including increasing activity level as much as possible.   3.  Follow up in six months.       Thank you for allowing me to participate in the care of this individual.    Stephannie Li. Susy [place], M.D., [facility]

## 2016-07-14 NOTE — Telephone Encounter (Signed)
PT called with following symptoms SOB, trouble breathing, fatigue, legs swollen, pain in ribs and across the back    Per SS advised pt to go to ED

## 2016-07-14 NOTE — Telephone Encounter (Signed)
CLINICAL PHARMACY NOTE - Medication Review  Rachael Mays is a 60 y.o. female referred to a clinical pharmacy specialist given history of COPD and number of home medications. A    Left message on home & #s for patient to return call.    Renaldo Reel, PharmD, [place]  Direct: [phone]  Department, toll free: [phone], option 7

## 2016-07-14 NOTE — Care Coordination-Inpatient (Signed)
[facility] Transitions Follow Up Call    07/14/2016    Patient: Rachael Mays  Patient DOB: [id number]   MRN: [id number]  Reason for Admission: Chest Pain  Discharge Date: 06/18/16 RARS: Risk Score: 23.75    Spoke with: Dineen Kid    Non-face-to-face services provided:  Obtained and reviewed discharge summary and/or continuity of care documents    Inpatient Assessment  Care Transitions Subsequent and Final Call    Subsequent and Final Calls   Do you have any ongoing symptoms?:  Yes   Onset of Patient-reported symptoms:  In the past 7 days   Patient-reported symptoms:  Chest Pain, Shortness of Breath, Fatigue   Have your medications changed?:  No   Do you have any questions related to your medications?:  No   Do you currently have any active services?:  No   Do you have any needs or concerns that I can assist you with?:  No   Identified Barriers:  None   Care Transitions Interventions   No Identified Needs   Other Interventions:                            CTC spoke with the Pt who reports still feeling SOB. Denies swelling or other edema.   No meds have changed since discharge, saw both PCP and Cardiologist, has concern with discomfort.   She had not taken BP today and did not weigh herself today, Encouraged to weigh daily each morning after using the bathroom, and to take BP daily.   Patient will call Dr. Garant Mince office today and will schedule another appt to address ongoing issue.       Follow Up  Future Appointments  Date Time Provider [place]   07/28/2016 10:00 AM Tobin Chad, MD [facility]   11/10/2016 12:20 PM Tiffanie Rita Ohara, CNP [facility]   12/28/2016 2:00 PM Francisca December, MD [facility]       Ethel Meisenheimer Loura Pardon, RN

## 2016-07-15 ENCOUNTER — Encounter: Primary: Sports Medicine

## 2016-07-15 ENCOUNTER — Inpatient Hospital Stay
Admit: 2016-07-16 | Discharge: 2016-07-17 | Payer: PRIVATE HEALTH INSURANCE | Source: Home / Self Care | Admitting: Emergency Medicine

## 2016-07-15 ENCOUNTER — Encounter: Admit: 2016-07-15 | Primary: Sports Medicine

## 2016-07-15 DIAGNOSIS — R0602 Shortness of breath: Secondary | ICD-10-CM

## 2016-07-15 LAB — BLOOD GAS, VENOUS
Base Excess, Ven: 3.1 mmol/L — ABNORMAL HIGH (ref <=3.0)
Carboxyhemoglobin: 2.9 % — ABNORMAL HIGH (ref 0.0–1.5)
HCO3, Venous: 28.3 mmol/L (ref 23.0–29.0)
MetHgb, Ven: 0.1 % (ref <1.5)
O2 Content, Ven: 20 VOL %
O2 Sat, Ven: 99 %
TC02 (Calc), Ven: 30 mmol/L
pCO2, Ven: 45.2 mmHg (ref 40.0–50.0)
pH, Ven: 7.41 (ref 7.35–7.45)
pO2, Ven: 124.3 mmHg — ABNORMAL HIGH (ref 25.0–40.0)

## 2016-07-15 LAB — BASIC METABOLIC PANEL
Anion Gap: 13 (ref 3–16)
BUN: 12 mg/dL (ref 7–20)
CO2: 27 mmol/L (ref 21–32)
Calcium: 9.6 mg/dL (ref 8.3–10.6)
Chloride: 103 mmol/L (ref 99–110)
Creatinine: 0.8 mg/dL (ref 0.6–1.1)
GFR African American: >60 (ref >60)
GFR Non-African American: >60 (ref >60)
Glucose: 112 mg/dL — ABNORMAL HIGH (ref 70–99)
Potassium: 3.9 mmol/L (ref 3.5–5.1)
Sodium: 143 mmol/L (ref 136–145)

## 2016-07-15 LAB — CBC WITH AUTO DIFFERENTIAL
Basophils %: 1.1 %
Basophils Absolute: 0.1 10*3/uL (ref 0.0–0.2)
Eosinophils %: 6.1 %
Eosinophils Absolute: 0.5 10*3/uL (ref 0.0–0.6)
Hematocrit: 42.6 % (ref 36.0–48.0)
Hemoglobin: 13.7 g/dL (ref 12.0–16.0)
Lymphocytes %: 34.8 %
Lymphocytes Absolute: 3 10*3/uL (ref 1.0–5.1)
MCH: 27.6 pg (ref 26.0–34.0)
MCHC: 32.2 g/dL (ref 31.0–36.0)
MCV: 85.7 fL (ref 80.0–100.0)
MPV: 9.2 fL (ref 5.0–10.5)
Monocytes %: 9.6 %
Monocytes Absolute: 0.8 10*3/uL (ref 0.0–1.3)
Neutrophils %: 48.4 %
Neutrophils Absolute: 4.2 10*3/uL (ref 1.7–7.7)
Platelets: 170 10*3/uL (ref 135–450)
RBC: 4.97 M/uL (ref 4.00–5.20)
RDW: 16.1 % — ABNORMAL HIGH (ref 12.4–15.4)
WBC: 8.6 10*3/uL (ref 4.0–11.0)

## 2016-07-15 LAB — HEPATIC FUNCTION PANEL
ALT: 32 U/L (ref 10–40)
AST: 38 U/L — ABNORMAL HIGH (ref 15–37)
Albumin: 3.8 g/dL (ref 3.4–5.0)
Alkaline Phosphatase: 122 U/L (ref 40–129)
Bilirubin, Direct: <0.2 mg/dL (ref 0.0–0.3)
Total Bilirubin: 0.6 mg/dL (ref 0.0–1.0)
Total Protein: 7.6 g/dL (ref 6.4–8.2)

## 2016-07-15 LAB — MAGNESIUM: Magnesium: 2 mg/dL (ref 1.80–2.40)

## 2016-07-15 LAB — BRAIN NATRIURETIC PEPTIDE: Pro-BNP: 151 pg/mL — ABNORMAL HIGH (ref 0–124)

## 2016-07-15 LAB — CK: Total CK: 157 U/L (ref 26–192)

## 2016-07-15 LAB — TROPONIN: Troponin: <0.01 ng/mL (ref <0.01)

## 2016-07-15 MED ORDER — ASPIRIN 325 MG PO TABS
325 MG | Freq: Once | ORAL | Status: AC
Start: 2016-07-15 — End: 2016-07-15
  Administered 2016-07-16: 325 mg via ORAL

## 2016-07-15 MED ORDER — NITROGLYCERIN 2 % TD OINT
2 % | Freq: Once | TRANSDERMAL | Status: AC
Start: 2016-07-15 — End: 2016-07-15
  Administered 2016-07-16: 1 [in_us] via TOPICAL

## 2016-07-15 NOTE — ED Notes (Signed)
Bed: 23-01  Expected date:   Expected time:   Means of arrival:   Comments:  Rachael Mays  07/15/16 [id number]

## 2016-07-15 NOTE — H&P (Signed)
Hospital Medicine History & Physical      PCP: Acquanetta Sit SLOUGH, MD    Date of Admission: 07/15/2016    Date of Service: Pt seen/examined on 8/9 and Placed in Observation.    Chief Complaint:  Chest pain      History Of Present Illness:    60 y.o. female w/ known CAD who presented to [facility] with chest pain. She has chronic L-sided chest pain but states it was more intense today, also said she felt like she couldn't breathe - like her lungs are filled up. Has had some nausea but no vomiting, also has had lightheadedness for past 2 days. States she does have the "sweats" sometimes.  Pt is s/p cardiac cath in April 2017, which showed significant stenosis mid LAD - she is s/p DES but this did not resolve her CP.  She was hospitalized a month ago again for CP, was seen by Cards and CP thought to be from small vessel dz, discharged home. She was seen outpt by her cardiologist, Dr. Susy Manor, 8 days ago on Aug 1 for hospital f/u - no med changes.  Pt's CXR does show new R basilar dz that could be pneumonia - however pt w/out fever or leukocytosis or change in cough so clinically do not think this is pneumonia - no abx at this time.    Past Medical History:          Diagnosis Date   ??? Anxiety    ??? Asthma    ??? Blood circulation, collateral    ??? CAD (coronary artery disease)    ??? CHF (congestive heart failure) ([place])    ??? Chronic back pain    ??? Deep vein thrombosis    ??? Depression    ??? GERD (gastroesophageal reflux disease)     NO LONGER SINCE LAP   ??? GERD (gastroesophageal reflux disease) 01/23/2009   ??? Hx of blood clots    ??? Hyperlipidemia     hx; resolved with lap band   ??? Hypertension    ??? Obesity     hx of; had lap band   ??? Obstructive sleep apnea (adult) (pediatric) 09/18/2015   ??? Pulmonary embolism [facility])        Past Surgical History:          Procedure Laterality Date   ??? BACK SURGERY      neck  plates   ??? BREAST SURGERY      left lumpectomy   ??? CESAREAN SECTION     ??? CHOLECYSTECTOMY     ??? COLONOSCOPY      ??? COLONOSCOPY  04/08/10   ??? CORONARY ANGIOPLASTY     ??? DIAGNOSTIC CARDIAC CATH LAB PROCEDURE     ??? DILATATION, ESOPHAGUS     ??? ENDOSCOPY, COLON, DIAGNOSTIC     ??? HYSTERECTOMY     ??? LAP BAND  05/01/08    Dr. Nicole Cella   ??? OTHER SURGICAL HISTORY      [place] Filter: curently in place   ??? OTHER SURGICAL HISTORY  07/05/13    lap band removal   ??? PARTIAL HYSTERECTOMY     ??? SHOULDER SURGERY     ??? UPPER GASTROINTESTINAL ENDOSCOPY  05/19/13   ??? UPPER GASTROINTESTINAL ENDOSCOPY  07/21/13    ESOPHAGEAL STENT PLACEMENT   ??? UPPER GASTROINTESTINAL ENDOSCOPY N/A 07/31/2014    Esophagogastroduodenoscopy with esophageal balloon dilation   ??? VASCULAR SURGERY  04/2015    Lucendia Herrlich, celiac artery angiogram, normal  abd arteries       Medications Prior to Admission:      Prior to Admission medications    Medication Sig Start Date End Date Taking? Authorizing Provider   ALPRAZolam Duanne Moron) 0.25 MG tablet Take 1 tablet by mouth 2 times daily as needed for Anxiety 06/22/16  Yes Harvel Quale, MD   potassium chloride (KLOR-CON M) 20 MEQ extended release tablet Take 2 tablets by mouth 2 times daily 06/22/16  Yes Harvel Quale, MD   venlafaxine (EFFEXOR XR) 150 MG extended release capsule TAKE ONE CAPSULE BY MOUTH ONE TIME A DAY 06/10/16  Yes Harvel Quale, MD   furosemide (LASIX) 40 MG tablet Take 80 mg in am and 40 mg in pm 05/21/16  Yes Maebelle Munroe Helferich, CNP   nystatin (MYCOSTATIN) 100000 UNIT/GM cream Apply topically 2 times daily. 05/05/16  Yes Francisca December, MD   atorvastatin (LIPITOR) 80 MG tablet Take 1 tablet by mouth daily 04/30/16  Yes Leodis Binet, MD   lisinopril (PRINIVIL;ZESTRIL) 40 MG tablet Take 1 tablet by mouth daily Indications: Patient thought this was dc'd in December by PCP 04/09/16  Yes Harvel Quale, MD   omeprazole [facility]) 40 MG delayed release capsule Take 1 capsule by mouth daily 04/09/16  Yes Harvel Quale, MD   busPIRone (BUSPAR) 10 MG tablet Take 1 tablet by mouth 3 times  daily 04/09/16  Yes Harvel Quale, MD   metoprolol (LOPRESSOR) 100 MG tablet TAKE TAKE 1 AND 1/2 TABLET BY MOUTH TABLETS BY MOUTH TWO TIMES A DAY 04/08/16  Yes Harvel Quale, MD   oxyCODONE-acetaminophen (PERCOCET) 5-325 MG per tablet Take 1 tablet by mouth every 6 hours as needed for Pain . 04/07/16  Yes Bhumit Patel, MD   nitroGLYCERIN (NITROSTAT) 0.4 MG SL tablet up to max of 3 total doses. If no relief after 1 dose, call 911. 04/06/16  Yes Bhumit Patel, MD   aspirin 81 MG EC tablet Take 1 tablet by mouth daily 04/02/16  Yes Leodis Binet, MD   prasugrel (EFFIENT) 10 MG TABS Take 1 tablet by mouth daily 04/02/16  Yes Leodis Binet, MD   triamcinolone (KENALOG) 0.1 % cream Apply to affected areas twice daily for up to 2 weeks or until improved. 03/23/16  Yes Francisca December, MD   montelukast (SINGULAIR) 10 MG tablet TAKE ONE TABLET BY MOUTH ONE TIME A DAY 03/18/16  Yes Jamal Collin, MD   traZODone (DESYREL) 100 MG tablet TAKE 1 TABLET BY MOUTH NIGHTLY  Patient taking differently: TAKE 1/2 TABLET BY MOUTH NIGHTLY 01/02/16  Yes Harvel Quale, MD   albuterol sulfate HFA (PROVENTIL HFA) 108 (90 BASE) MCG/ACT inhaler Inhale 2 puffs into the lungs every 6 hours as needed for Wheezing 12/27/15  Yes Harvel Quale, MD   Blood Pressure Monitor KIT Use daily as needed 12/20/15  Yes Harvel Quale, MD   ondansetron [facility]) 4 MG tablet Take 1 tablet by mouth daily as needed for Nausea or Vomiting 06/03/15  Yes Harvel Quale, MD   Multiple Vitamins-Minerals (MULTIVITAMIN PO) Take by mouth   Yes Historical Provider, MD   CALCIUM-VITAMIN D PO Take by mouth   Yes Historical Provider, MD   cetirizine (ZYRTEC ALLERGY) 10 MG tablet Take 10 mg by mouth daily.   Yes Historical Provider, MD       Allergies:  Morphine and  Talwin [pentazocine]    Social History:      The patient currently lives at home w/ her husband.    TOBACCO:   reports that she quit smoking about 3 years ago. Her  smoking use included Cigarettes. She has a 20.00 pack-year smoking history. She quit smokeless tobacco use about 3 years ago.  ETOH:   reports that she drinks alcohol.      Family History:      Reviewed in detail and Positive as follows:        Problem Relation Age of Onset   ??? Arthritis Mother    ??? Cancer Mother    ??? Depression Mother    ??? High Blood Pressure Mother    ??? Heart Disease Mother 22     MI    ??? Ovarian Cancer Mother 29   ??? Cancer Father 84     colon   ??? Heart Disease Maternal Grandmother    ??? Early Death Paternal Grandmother    ??? Heart Disease Paternal Grandfather    ??? Diabetes Other    ??? High Blood Pressure Other    ??? Obesity Other    ??? Other Sister      OSA   ??? Other Brother      OSA   ??? Other Brother      OSA   ??? Other Daughter      OSA       REVIEW OF SYSTEMS:   Pertinent positives as noted in the HPI. All other systems reviewed and negative.    PHYSICAL EXAM:    BP (!) 140/58   Pulse 56   Temp 98.1 ??F (36.7 ??C)   Resp 20   Ht '5\' 6"'$  (1.676 m)   Wt (!) 304 lb (137.9 kg)   SpO2 95%   BMI 49.07 kg/m2    General appearance:  No apparent distress, appears stated age and cooperative. Morbidly obese.  HEENT:  Normal cephalic, atraumatic without obvious deformity. Pupils equal, round, and reactive to light.  Extra ocular muscles intact. Conjunctivae/corneas clear.  Neck: Supple, with full range of motion. No jugular venous distention. Trachea midline.  Respiratory:  Normal respiratory effort. Clear to auscultation, bilaterally without Rales/Wheezes/Rhonchi.  Cardiovascular:  Regular rate and rhythm with normal S1/S2 without murmurs, rubs or gallops.  Abdomen: Soft, non-tender, non-distended with normal bowel sounds.  Musculoskeletal: 1+ BLE edema. Good range of motion without deformity.  Skin: Skin color, texture, turgor normal.  No rashes or lesions.  Neurologic:  Neurovascularly intact without any focal sensory/motor deficits. Cranial nerves: II-XII intact, grossly non-focal.  Psychiatric:  Alert and  oriented, thought content appropriate, normal insight  Capillary Refill: Brisk,< 3 seconds   Peripheral Pulses: +2 palpable, equal bilaterally       Labs:     Recent Labs      07/15/16   1938   WBC  8.6   HGB  13.7   HCT  42.6   PLT  170     Recent Labs      07/15/16   1938   NA  143   K  3.9   CL  103   CO2  27   BUN  12   CREATININE  0.8   CALCIUM  9.6     Recent Labs      07/15/16   1938   AST  38*   ALT  32   BILIDIR  <0.2   BILITOT  0.6  ALKPHOS  122     Recent Labs      07/15/16   1939   INR  1.06     Recent Labs      07/15/16   1938   CKTOTAL  157   TROPONINI  <0.01       Urinalysis:      Lab Results   Component Value Date    NITRU Negative 07/15/2016    WBCUA 4 07/16/2014    BACTERIA 0 12/19/2015    BACTERIA 2+ 07/16/2014    RBCUA 0 12/19/2015    RBCUA 2 07/16/2014    BLOODU Negative 07/15/2016    SPECGRAV 1.007 07/15/2016    GLUCOSEU Negative 07/15/2016       Radiology:     CXR: I have reviewed the CXR with the following interpretation: normal.  EKG:  I have reviewed the EKG with the following interpretation: NSR.    XR Chest Portable   Final Result   New mild right basilar airspace disease concerning for pneumonia.             ASSESSMENT:    Active Hospital Problems    Diagnosis Date Noted   ??? Status post insertion of drug-eluting stent into left anterior descending (LAD) artery [Z95.5] 03/31/2016     Priority: High   ??? Pulmonary hypertension ([place]) [I27.2] 03/31/2016     Priority: High   ??? Coronary artery disease due to lipid rich plaque [I25.10, I25.83] 03/31/2016     Priority: High   ??? Chronic diastolic congestive heart failure ([place]) [I50.32] 03/30/2016     Priority: High   ??? Anxiety and depression [F41.9, F32.9] 07/15/2016   ??? Chest pain [R07.9] 04/05/2016   ??? Obstructive sleep apnea (adult) (pediatric) [G47.33] 09/18/2015   ??? Essential hypertension [I10] 07/24/2013   ??? Morbid obesity due to excess calories ([place]) [E66.01] 02/21/2009   ??? Hyperlipidemia [E78.5] 01/23/2009         PLAN:  1. Chest pain -  in pt w/ known CAD. EKG NSR, initial troponin negative. Hospitalized a month ago for the same and had negative workup. S/P cath in April w/ LAD stent but w/out resolution of CP. On-call cardiologist contacted by ED and rec'd admission to r/o ACS. Will check serial troponins and consult pt's cardiologist (Dr. Susy Manor), doubt think stress test would be beneficial - will defer to Cards. Had normal lipid panel a month ago. Cont statin, ASA/effient, BB, ACE-I. Suspect anxiety is playing role in pt's chronic CP.  2. CAD - s/p LAD stent in April 2017. Cont current home meds - ASA/effient, BB, ACE-I, statin, prn NTG.   3. Chronic diastolic CHF - BNP 578 today, no edema on CXR. Cont home doses of lasix - '80mg'$  po QAM, '40mg'$  po QPM. Followed by Dr. Jacqualyn Posey.  4. Anxiety/depression - cont home meds - effexor XR, buspar, trazodone, prn xanax.  5. HTN - on metoprolol, lisinopril.  6. HLD - on statin. Normal lipid panel a month ago.  7. GERD - on PPI.  8. Chronic back pain - cont home dose of prn percocet.   9. Morbid obesity      DVT Prophylaxis: lovenox  Diet:  cardiac  Code Status: Prior    PT/OT Eval Status: not ordered    Dispo - overnight       Brayton Mars, MD    Thank you Acquanetta Sit SLOUGH, MD for the opportunity to be involved in this patient's care. If you have any questions  or concerns please feel free to contact me at [phone].

## 2016-07-15 NOTE — ED Notes (Signed)
Report called to 3A at this time   Pt status, plan of care, vitals, lab and imaging results, and future orders discussed with RN at this time.   RN denies any questions or concerns. Pt pending transport to unit.   See flow sheet and MAR for medication administration and vitals prior to departure.   Pt without any s/s distress upon departure from ED.     Tele verified with unit. Sinus Brady rate of 50.      Glendora Score, RN  07/15/16 [phone]

## 2016-07-15 NOTE — Progress Notes (Signed)
RESPIRATORY THERAPY ASSESSMENT    Name:  [place] Record Number:  [id number]  Age: 60 y.o.   Gender: female  DOB: 03/12/1956  Today's Date:  07/15/2016  Room:  3AN-3318/3318-01    Assessment   Patient Admission Diagnosis      Allergies  Allergies   Allergen Reactions   ??? Morphine Itching   ??? Talwin [Pentazocine] Other (See Comments)     dizzy       Minimum Predicted Vital Capacity:     UTA          Actual Vital Capacity:      UTA          Pulmonary History: Asthma   Home Oxygen Therapy:  room air  Home Respiratory Therapy: Albuterol  Current Respiratory Therapy:  Albterol MDI Q6 PRN          Respiratory Severity Index(RSI)   Patients with orders for inhalation medications, oxygen, or any therapeutic treatment modality will be placed on Respiratory Protocol.  They will be assessed with the first treatment and at least every 72 hours thereafter.  The following severity scale will be used to determine frequency of treatment intervention.    Smoking History: Mild Exacerbation = 3    Social History  Social History   Substance Use Topics   ??? Smoking status: Former Smoker     Packs/day: 0.50     Years: 40.00     Types: Cigarettes     Quit date: 08/07/2012   ??? Smokeless tobacco: Former Systems developer     Quit date: 06/16/2013      Comment: started to smoke at age 81 / only smoked 0.5 p.p.d    ??? Alcohol use 0.0 oz/week     0 Standard drinks or equivalent per week      Comment: occasionally        Recent Surgical History: None = 0  Past Surgical History  Past Surgical History:   Procedure Laterality Date   ??? BACK SURGERY      neck  plates   ??? BREAST SURGERY      left lumpectomy   ??? CESAREAN SECTION     ??? CHOLECYSTECTOMY     ??? COLONOSCOPY     ??? COLONOSCOPY  04/08/10   ??? CORONARY ANGIOPLASTY     ??? DIAGNOSTIC CARDIAC CATH LAB PROCEDURE     ??? DILATATION, ESOPHAGUS     ??? ENDOSCOPY, COLON, DIAGNOSTIC     ??? HYSTERECTOMY     ??? LAP BAND  05/01/08    Dr. Nicole Cella   ??? OTHER SURGICAL HISTORY      [place] Filter: curently in place   ??? OTHER  SURGICAL HISTORY  07/05/13    lap band removal   ??? PARTIAL HYSTERECTOMY     ??? SHOULDER SURGERY     ??? UPPER GASTROINTESTINAL ENDOSCOPY  05/19/13   ??? UPPER GASTROINTESTINAL ENDOSCOPY  07/21/13    ESOPHAGEAL STENT PLACEMENT   ??? UPPER GASTROINTESTINAL ENDOSCOPY N/A 07/31/2014    Esophagogastroduodenoscopy with esophageal balloon dilation   ??? VASCULAR SURGERY  04/2015    Lucendia Herrlich, celiac artery angiogram, normal abd arteries       Level of Consciousness: Alert, Oriented, and Cooperative = 0    Level of Activity: Walking unassisted = 0    Respiratory Pattern: Regular Pattern; RR 8-20 = 0    Breath Sounds: Clear = 0    Sputum   ,  ,    Cough: Strong,  spontaneous, non-productive = 0    Vital Signs   BP 111/70   Pulse 56   Temp 97.2 ??F (36.2 ??C) (Temporal)    Resp 18   Ht '5\' 6"'  (1.676 m)   Wt (!) 302 lb 6.4 oz (137.2 kg)   SpO2 93%   BMI 48.81 kg/m2  SPO2 (COPD values may differ): Greater than or equal to 92% on room air = 0    Peak Flow (asthma only): not applicable = 0    RSI: 0-4 = See once and convert to home regimen or discontinue        Plan       Goals: medication delivery  Plan of Care: Albuterol MDI Q6 PRN    Patient/caregiver was educated on the proper method of use:  Yes      Level of patient/caregiver understanding able to:   Verbalize understanding   Demonstrate understanding       Teach back        Needs reinforcement         No available caregiver                 Other:    Response to education:  Excellent     Teaching Time:  0  minutes      Is patient being placed on Home Treatment Regimen?  Yes     Electronically signed by Christy Gentles, RCP on 07/15/2016 at 11:20 PM    Respiratory Protocol Guidelines     1. Assessment and treatment by Respiratory Therapy will be initiated for medication and therapeutic interventions upon initiation of aerosolized medication.  2. Physician will be contacted for respiratory rate (RR) greater than 35 breaths per minute. Therapy will be held for heart rate (HR) greater than 140  beats per minute, pending direction from physician.  3. Bronchodilators will be administered via Metered Dose Inhaler (MDI) with spacer when the following criteria are met:  a. Alert and cooperative     b. HR < 140 bpm  c. RR < 30 bpm                d. Can demonstrate a 2???3 second inspiratory hold  4. Bronchodilators will be administered via Hand Held Nebulizer [facility]) to patients when ANY of the following criteria are met  a. Incognizant or uncooperative          b. Patients treated with HHN at Home        c. Unable to demonstrate proper MDI or HFA technique     d. RR > 30 bpm   5. Bronchodilators will be delivered via Metered Dose Inhaler (MDI) or Hydrofluoroalkane (HFA) with spacer to intubated patients on mechanical ventilation.  6. Inhalation medication orders will be delivered and/or substituted as outlined below.    Aerosolized Medications Ordering and Administration Guidelines:    1. All Medications will be ordered by a physician, and their frequency and/or modality will be adjusted as defined by the patients Respiratory Severity Index (RSI) score.  2. If the patient does not have documented COPD, consider discontinuing anticholinergics when RSI is less than 9.  3. If the bronchospasm worsens (increased RSI), then the bronchodilator frequency can be increased to a maximum of every 4 hours.  If greater than every 4 hours is required, the physician will be contacted.  4. If the bronchospasm improves, the frequency of the bronchodilator can be decreased, based on the patient's RSI, but not less than home treatment  regimen frequency.  5. Bronchodilator(s) will be discontinued if patient has a RSI less than 9 and has received no scheduled or as needed treatment for 72  Hrs.    Patients Ordered on a Mucolytic Agent:    1. Must always be administered with a bronchodilator.    2. Discontinue if patient experiences worsened bronchospasm, or secretions have lessened to the point that the patient is able to clear them  with a cough.    Anti-inflammatory and Combination Medications:    1. If the patient lacks prior history of lung disease, is not using inhaled anti-inflammatory medication at home, and lacks wheezing by examination or by history for at least 24 hours, contact physician for possible discontinuation.

## 2016-07-15 NOTE — ED Provider Notes (Signed)
eMERGENCY dEPARTMENT eNCOUnter        Pt Name: Rachael Mays  MRN: [id number]  Lamar [birth year]  Date of evaluation: 07/15/2016  Provider: Vickki Hearing, PA  PCP: Harvel Quale, MD  ED Attending: Gustavus Bryant    CHIEF COMPLAINT       Chief Complaint   Patient presents with   ??? Shortness of Breath     Pt c/o SOB and CP for 2/3 days.        HISTORY OF PRESENT ILLNESS   (Location/Symptom, Timing/Onset, Context/Setting, Quality, Duration, Modifying Factors, Severity)  Note limiting factors.     Rachael Mays is a 60 y.o. female who presents here to the emergency department, she states that she's been short of breath dyspnea with exertion, feeling like she is drowning.  The last 2 days.  She has a history of CHF, sees Dr. Earleen Newport.  She has had similar issues in the past but this time she says that her chest pain is worse, pain level 8/10, and nothing seems to make her completely better.  She denies any recent long car rides, travel, trips or recent surgeries.  She denies any prolonged immobilization.     Nursing Notes were all reviewed and agreed with or any disagreements were addressed  in the HPI.    REVIEW OF SYSTEMS    (2-9 systems for level 4, 10 or more for level 5)     Review of Systems   Constitutional: Negative for chills and fever.   Respiratory: Positive for shortness of breath. Negative for cough.    Cardiovascular: Positive for chest pain.   Gastrointestinal: Negative for abdominal pain, nausea and vomiting.   Genitourinary: Negative for dysuria.   Musculoskeletal: Negative for neck pain.   All other systems reviewed and are negative.      Positives and Pertinent negatives as per HPI.  Except as noted above in the ROS, all other systems were reviewed and negative.       PAST MEDICAL HISTORY     Past Medical History:   Diagnosis Date   ??? Anxiety    ??? Asthma    ??? Blood circulation, collateral    ??? CAD (coronary artery disease)    ??? CHF (congestive heart failure) ([place])    ???  Chronic back pain    ??? Deep vein thrombosis    ??? Depression    ??? GERD (gastroesophageal reflux disease)     NO LONGER SINCE LAP   ??? GERD (gastroesophageal reflux disease) 01/23/2009   ??? Hx of blood clots    ??? Hyperlipidemia     hx; resolved with lap band   ??? Hypertension    ??? Obesity     hx of; had lap band   ??? Obstructive sleep apnea (adult) (pediatric) 09/18/2015   ??? Pulmonary embolism ([place])          SURGICAL HISTORY       Past Surgical History:   Procedure Laterality Date   ??? BACK SURGERY      neck  plates   ??? BREAST SURGERY      left lumpectomy   ??? CESAREAN SECTION     ??? CHOLECYSTECTOMY     ??? COLONOSCOPY     ??? COLONOSCOPY  04/08/10   ??? CORONARY ANGIOPLASTY     ??? DIAGNOSTIC CARDIAC CATH LAB PROCEDURE     ??? DILATATION, ESOPHAGUS     ??? ENDOSCOPY, COLON, DIAGNOSTIC     ???  HYSTERECTOMY     ??? LAP BAND  05/01/08    Dr. Nicole Cella   ??? OTHER SURGICAL HISTORY      [place] Filter: curently in place   ??? OTHER SURGICAL HISTORY  07/05/13    lap band removal   ??? PARTIAL HYSTERECTOMY     ??? SHOULDER SURGERY     ??? UPPER GASTROINTESTINAL ENDOSCOPY  05/19/13   ??? UPPER GASTROINTESTINAL ENDOSCOPY  07/21/13    ESOPHAGEAL STENT PLACEMENT   ??? UPPER GASTROINTESTINAL ENDOSCOPY N/A 07/31/2014    Esophagogastroduodenoscopy with esophageal balloon dilation   ??? VASCULAR SURGERY  04/2015    Lucendia Herrlich, celiac artery angiogram, normal abd arteries         CURRENT MEDICATIONS       Current Discharge Medication List      CONTINUE these medications which have NOT CHANGED    Details   ALPRAZolam (XANAX) 0.25 MG tablet Take 1 tablet by mouth 2 times daily as needed for Anxiety  Qty: 60 tablet, Refills: 0    Associated Diagnoses: Anxiety      potassium chloride (KLOR-CON M) 20 MEQ extended release tablet Take 2 tablets by mouth 2 times daily  Qty: 120 tablet, Refills: 3      venlafaxine (EFFEXOR XR) 150 MG extended release capsule TAKE ONE CAPSULE BY MOUTH ONE TIME A DAY  Qty: 90 capsule, Refills: 1    Associated Diagnoses: Depression, unspecified depression type       furosemide (LASIX) 40 MG tablet Take 80 mg in am and 40 mg in pm  Qty: 180 tablet, Refills: 3      nystatin (MYCOSTATIN) 100000 UNIT/GM cream Apply topically 2 times daily.  Qty: 30 g, Refills: 2      atorvastatin (LIPITOR) 80 MG tablet Take 1 tablet by mouth daily  Qty: 90 tablet, Refills: 3    Associated Diagnoses: Shortness of breath; Other chest pain; Acute diastolic congestive heart failure ([place]); Essential hypertension; Hyperlipidemia, unspecified hyperlipidemia type      lisinopril (PRINIVIL;ZESTRIL) 40 MG tablet Take 1 tablet by mouth daily Indications: Patient thought this was dc'd in December by PCP  Qty: 90 tablet, Refills: 3    Associated Diagnoses: Essential hypertension      omeprazole (PRILOSEC) 40 MG delayed release capsule Take 1 capsule by mouth daily  Qty: 90 capsule, Refills: 3    Associated Diagnoses: Gastroesophageal reflux disease without esophagitis      busPIRone (BUSPAR) 10 MG tablet Take 1 tablet by mouth 3 times daily  Qty: 270 tablet, Refills: 3    Associated Diagnoses: Anxiety      metoprolol (LOPRESSOR) 100 MG tablet TAKE TAKE 1 AND 1/2 TABLET BY MOUTH TABLETS BY MOUTH TWO TIMES A DAY  Qty: 270 tablet, Refills: 1      oxyCODONE-acetaminophen (PERCOCET) 5-325 MG per tablet Take 1 tablet by mouth every 6 hours as needed for Pain .  Qty: 40 tablet, Refills: 0      nitroGLYCERIN (NITROSTAT) 0.4 MG SL tablet up to max of 3 total doses. If no relief after 1 dose, call 911.  Qty: 25 tablet, Refills: 3      aspirin 81 MG EC tablet Take 1 tablet by mouth daily  Qty: 30 tablet, Refills: 11      prasugrel (EFFIENT) 10 MG TABS Take 1 tablet by mouth daily  Qty: 90 tablet, Refills: 3      triamcinolone (KENALOG) 0.1 % cream Apply to affected areas twice daily for up to 2 weeks  or until improved.  Qty: 80 g, Refills: 1      montelukast (SINGULAIR) 10 MG tablet TAKE ONE TABLET BY MOUTH ONE TIME A DAY  Qty: 90 tablet, Refills: 1      traZODone (DESYREL) 100 MG tablet TAKE 1 TABLET BY MOUTH  NIGHTLY  Qty: 90 tablet, Refills: 1    Associated Diagnoses: Insomnia, unspecified type      albuterol sulfate HFA (PROVENTIL HFA) 108 (90 BASE) MCG/ACT inhaler Inhale 2 puffs into the lungs every 6 hours as needed for Wheezing  Qty: 3 Inhaler, Refills: 3      Blood Pressure Monitor KIT Use daily as needed  Qty: 1 kit, Refills: 0      ondansetron (ZOFRAN) 4 MG tablet Take 1 tablet by mouth daily as needed for Nausea or Vomiting  Qty: 30 tablet, Refills: 1      Multiple Vitamins-Minerals (MULTIVITAMIN PO) Take by mouth      CALCIUM-VITAMIN D PO Take by mouth      cetirizine (ZYRTEC ALLERGY) 10 MG tablet Take 10 mg by mouth daily.               ALLERGIES     Morphine and Talwin [pentazocine]    FAMILY HISTORY       Family History   Problem Relation Age of Onset   ??? Arthritis Mother    ??? Cancer Mother    ??? Depression Mother    ??? High Blood Pressure Mother    ??? Heart Disease Mother 67     MI    ??? Ovarian Cancer Mother 9   ??? Cancer Father 24     colon   ??? Heart Disease Maternal Grandmother    ??? Early Death Paternal Grandmother    ??? Heart Disease Paternal Grandfather    ??? Diabetes Other    ??? High Blood Pressure Other    ??? Obesity Other    ??? Other Sister      OSA   ??? Other Brother      OSA   ??? Other Brother      OSA   ??? Other Daughter      OSA          SOCIAL HISTORY       Social History     Social History   ??? Marital status: Married     Spouse name: [name]   ??? Number of children: 2   ??? Years of education: 44     Social History Main Topics   ??? Smoking status: Former Smoker     Packs/day: 0.50     Years: 40.00     Types: Cigarettes     Quit date: 08/07/2012   ??? Smokeless tobacco: Former Systems developer     Quit date: 06/16/2013      Comment: started to smoke at age 41 / only smoked 0.5 p.p.d    ??? Alcohol use 0.0 oz/week     0 Standard drinks or equivalent per week      Comment: occasionally    ??? Drug use: No   ??? Sexual activity: No     Other Topics Concern   ??? None     Social History Narrative    ** Merged History Encounter **             SCREENINGS    Glasgow Coma Scale  Eye Opening: Spontaneous  Best Verbal Response: Oriented  Best Motor Response: Obeys commands  Glasgow Coma Scale  Score: 15 Heart Score for chest pain patients  History: Moderately Suspicious  ECG: Normal  Patient Age: > 54 and < 65 years  *Risk factors for Atherosclerotic disease: Obesity, Hypertension, Hypercholesterolemia  Risk Factors: > 3 Risk factors or history of atherosclerotic disease*  Troponin: < 1X normal limit  Heart Score Total: 4      PHYSICAL EXAM    (up to 7 for level 4, 8 or more for level 5)   ED Triage Vitals   BP Temp Temp Source Pulse Resp SpO2 Height Weight   07/15/16 1859 07/15/16 1859 07/15/16 2229 07/15/16 1859 07/15/16 1859 07/15/16 1859 07/15/16 1859 07/15/16 1859   128/76 98.1 ??F (36.7 ??C) Temporal 55 18 96 % '5\' 6"'  (1.676 m) 304 lb (137.9 kg)       Physical Exam   Constitutional: She is oriented to person, place, and time. She appears well-developed and well-nourished.   Body mass index is 48.81 kg/(m^2).    HENT:   Head: Normocephalic and atraumatic.   Right Ear: External ear normal.   Left Ear: External ear normal.   Nose: Nose normal.   Eyes: Right eye exhibits no discharge. Left eye exhibits no discharge.   Neck: Normal range of motion. Neck supple.   Cardiovascular: Normal rate, regular rhythm, normal heart sounds and intact distal pulses.  Exam reveals no gallop and no friction rub.    No murmur heard.  Pulmonary/Chest: Effort normal and breath sounds normal. No stridor. No respiratory distress. She has no wheezes. She has no rales. She exhibits no tenderness.   Abdominal: Soft. Normal appearance and bowel sounds are normal. She exhibits no distension, no abdominal bruit, no pulsatile midline mass and no mass. There is no tenderness. There is no rigidity, no rebound, no guarding, no CVA tenderness, no tenderness at McBurney's point and negative Murphy's sign.   Musculoskeletal: Normal range of motion.   Neurological: She is alert and oriented  to person, place, and time.   Skin: Skin is warm and dry. She is not diaphoretic. No pallor.   Psychiatric: She has a normal mood and affect. Her behavior is normal.   Nursing note and vitals reviewed.      DIAGNOSTIC RESULTS   LABS:    Labs Reviewed   CBC WITH AUTO DIFFERENTIAL - Abnormal; Notable for the following:        Result Value    RDW 16.1 (*)     All other components within normal limits   BASIC METABOLIC PANEL - Abnormal; Notable for the following:     Glucose 112 (*)     All other components within normal limits   HEPATIC FUNCTION PANEL - Abnormal; Notable for the following:     AST 38 (*)     All other components within normal limits   BRAIN NATRIURETIC PEPTIDE - Abnormal; Notable for the following:     Pro-BNP 151 (*)     All other components within normal limits   BLOOD GAS, VENOUS - Abnormal; Notable for the following:     pO2, Ven 124.3 (*)     Base Excess, Ven 3.1 (*)     Carboxyhemoglobin 2.9 (*)     All other components within normal limits   URINE RT REFLEX TO CULTURE   TROPONIN   CK   MAGNESIUM   APTT   PROTIME-INR   TROPONIN   TROPONIN       All other labs were within normal range or not returned as  of this dictation.    EKG: All EKG's are interpreted by the Emergency Department Physician who either signs or Co-signs this chart in the absence of a cardiologist.  Please see their note for interpretation of EKG.      RADIOLOGY:   Non-plain film images such as CT, Ultrasound and MRI are read by the radiologist. Plain radiographic images are visualized and preliminarily interpreted by the  ED Provider with the below findings:        Interpretation per the Radiologist below, if available at the time of this note:    XR Chest Portable   Final Result   New mild right basilar airspace disease concerning for pneumonia.           Xr Chest Portable    Result Date: 07/15/2016  EXAMINATION: SINGLE VIEW OF THE CHEST 07/15/2016 7:28 pm COMPARISON: CT chest and chest radiograph June 16, 2016 HISTORY: ORDERING  PHYSICIAN PROVIDED HISTORY: SOB TECHNOLOGIST PROVIDED HISTORY: Technologist Provided Reason for Exam: Pt c/o SOB and CP for 2/3 days. Acuity: Chronic Type of Encounter: Unknown FINDINGS: Study is limited due to patient body habitus and underpenetration.  There appears to be new mild right basilar airspace disease.  No pneumothorax or pleural effusion identified.  Cardiac silhouette is at the upper limits of normal, similar in appearance.  Postsurgical changes again seen in the cervical spine.  No acute osseous abnormality.     New mild right basilar airspace disease concerning for pneumonia.         PROCEDURES   Unless otherwise noted below, none     Procedures    CRITICAL CARE TIME   N/A    CONSULTS:  IP CONSULT TO HOSPITALIST  IP CONSULT TO CARDIOLOGY  IP CONSULT TO CARDIOLOGY      EMERGENCY DEPARTMENT COURSE and DIFFERENTIAL DIAGNOSIS/MDM:   Vitals:    Vitals:    07/15/16 2045 07/15/16 2156 07/15/16 2229 07/15/16 2235   BP:  111/63 111/70    Pulse: 56 58 56 56   Resp: '20 16 18    ' Temp:   97.2 ??F (36.2 ??C)    TempSrc:   Temporal    SpO2: 95% 94% 93%    Weight:   (!) 302 lb 6.4 oz (137.2 kg)    Height:   '5\' 6"'  (1.676 m)        Patient was given the following medications:  Medications   sodium chloride flush 0.9 % injection 10 mL (not administered)   sodium chloride flush 0.9 % injection 10 mL (not administered)   acetaminophen (TYLENOL) tablet 650 mg (not administered)   magnesium hydroxide (MILK OF MAGNESIA) 400 MG/5ML suspension 30 mL (not administered)   ondansetron (ZOFRAN) injection 4 mg (not administered)   aspirin chewable tablet 81 mg (not administered)   enoxaparin (LOVENOX) injection 40 mg (not administered)   nitroGLYCERIN (NITROSTAT) SL tablet 0.4 mg (not administered)   ALPRAZolam (XANAX) tablet 0.25 mg (not administered)   albuterol sulfate HFA 108 (90 Base) MCG/ACT inhaler 2 puff (not administered)   atorvastatin (LIPITOR) tablet 80 mg (not administered)   busPIRone (BUSPAR) tablet 10 mg (not  administered)   calcium-vitamin D (OSCAL-500) 500-200 MG-UNIT per tablet 1 tablet (not administered)   cetirizine (ZYRTEC) tablet 10 mg (not administered)   furosemide (LASIX) tablet 80 mg (not administered)   lisinopril (PRINIVIL;ZESTRIL) tablet 40 mg (not administered)   metoprolol tartrate (LOPRESSOR) tablet 150 mg (not administered)   montelukast (SINGULAIR) tablet 10 mg (not administered)  nystatin (MYCOSTATIN) cream (not administered)   pantoprazole (PROTONIX) tablet 40 mg (not administered)   oxyCODONE-acetaminophen (PERCOCET) 5-325 MG per tablet 1 tablet (not administered)   potassium chloride (KLOR-CON M) extended release tablet 40 mEq (not administered)   prasugrel (EFFIENT) tablet 10 mg (not administered)   traZODone (DESYREL) tablet 50 mg (not administered)   triamcinolone (KENALOG) 0.1 % cream (not administered)   venlafaxine (EFFEXOR XR) extended release capsule 150 mg (not administered)   aspirin tablet 325 mg (325 mg Oral Given 07/15/16 2017)   nitroglycerin (NITRO-BID) 2 % ointment 1 inch (1 inch Topical Given 07/15/16 2017)   furosemide (LASIX) injection 40 mg (40 mg Intravenous Given 07/15/16 2017)   promethazine (PHENERGAN) injection 12.5 mg (12.5 mg Intravenous Given 07/15/16 2017)       Heart Score:  Heart Score  Heart Score for chest pain patients  History: Moderately Suspicious  ECG: Normal  Patient Age: > 51 and < 65 years  *Risk factors for Atherosclerotic disease: Obesity, Hypertension, Hypercholesterolemia  Risk Factors: > 3 Risk factors or history of atherosclerotic disease*  Troponin: < 1X normal limit  Heart Score Total: 4    Chest Pain:  The differential diagnosis of the chest pain includes Acute Coronary Syndrome, Stable and Unstable Angina, Gastro-Esophageal and musculoskeletal etiologies.  I suspect the chest pain is potentially cardiac in etiology, the patient has known microvascular disease however she states this feels very different, consulted cardiology who felt that it was  reasonable for her to be admitted to the hospital.        The patient tolerated their visit well.  They were seen and evaluated by the attending physician, Brayton Mars, MD who agreed with the assessment and plan.  The patient and / or the family were informed of the results of any tests, a time was given to answer questions, a plan was proposed and they agreed with plan.        FINAL IMPRESSION      1. Shortness of breath    2. Chest pain, unspecified type          DISPOSITION/PLAN   DISPOSITION Admitted    PATIENT REFERRED TO:  Harvel Quale, [place] [address]  [phone]            DISCHARGE MEDICATIONS:  Current Discharge Medication List          DISCONTINUED MEDICATIONS:  Current Discharge Medication List                 (Please note that portions of this note were completed with a voice recognition program.  Efforts were made to edit the dictations but occasionally words are mis-transcribed.)    Vickki Hearing, PA (electronically signed)           Vickki Hearing, PA  07/15/16 2337

## 2016-07-15 NOTE — Progress Notes (Signed)
Admission and assessment completed.

## 2016-07-15 NOTE — Progress Notes (Signed)
Pt admit to room 3A, room 3318. SB on tele. Awake, alert and oriented x4. Sister at bedside, updated on POC, questions answered, v/u. Provided pt with boxed sandwich dinner. Pt denies needs at this time. Call light within reach, oriented to room. Will monitor.

## 2016-07-15 NOTE — ED Provider Notes (Signed)
Attending Supervisory Note/Shared Visit   60 year old female with a history of numerous medical problems including asthma, chronic back pain in pain management, DVT, high cholesterol, PE, known heart disease, history of heart failure who presented today because of shortness of breath and chest pain.    Although the initial history and physical examination information was obtained by the advanced practice provider who also documents a record of this visit, I independently examined the patient and made all diagnostic, treatment and disposition decisions.  She is very concerned that she may back and heart failure again, she feels very short of breath with some chest tightness when she walks around, is relieved with rest.  She deficit be very much at all, she's not been able to use her BiPAP mask as she wakes up in the middle night with obviously off.  She short of breath at night as well.  She says that her legs are swollen, she feels very overload with fluid and even her hands are swollen.  Her Lasix regimen has been unchanged.  Denies any coughing, wheezing.  Denies fever.  Denies abdominal pain or vomiting.  She will occasionally get some rib pain however when she takes deep breaths in.  On exam vital signs reviewed.  In general this is a morbidly obese female sitting in the hospital chair and she is nontoxic pleasant and reactive.  Lungs are clear bilaterally without wheezing or respiratory difficulty, heart is S1 and S2 sinus bradycardia, decreased heart sounds are noted.  No rub or gallop.  No obvious JVD, extremities do show pitting edema.  Differential would include MI, PE, dissection, pleural or pericardial disease, heart failure, or other acute processes.  We'll give her aspirin and topical nitrates, EKG is unremarkable.      Basic laboratory studies are performed.  Coags unremarkable, CBC unremarkable,   BMP, electrolytes unremarkable, urine unremarkable, no signs of renal failure.  Troponin normal.  BNP  slightly elevated, venous blood gas is unremarkable, x-ray chest shows what is been described as new right basilar airspace disease concerning for pneumonia but at this time she has no fevers or chills she has no cough or congestion is consistent with abdominal processes.  She complains of being overloaded, short of breath with exertional chest pain or dyspnea.  Therefore this point, she will be admitted to the medical service for inpatient   Monitored on a cardiac basis.  Given one dose of Lasix intake as well as Phenergan, and she is admitted to medical service in stable condition    35 minutes of critical care time discharged this patient for frequent hemodynamic, cardiovascular, neurological and respiratory monitoring due to their life-threatening condition that required the diagnostics and treatment that is noted.  This is independent of all procedures, family discussions and consultations      EKG performed interpreted by myself initially at 1854 shows sinus bradycardia 53 beats a minute.  Axis leftward, R-wave progression is normal.  No other pathological ST or T-wave changes are noted, intervals normal, no other pattern of injury identified.  Compared to EKG on June 17, 2016 demonstrates no changes    For further details of my evaluation of this patient, please see the advance practice provider's documentation.      Karl Pock, DO  Attending Emergency Physician         Gustavus Bryant, DO  07/16/16 [phone]

## 2016-07-16 LAB — URINALYSIS WITH REFLEX TO CULTURE
Bilirubin Urine: NEGATIVE
Blood, Urine: NEGATIVE
Glucose, Ur: NEGATIVE mg/dL
Ketones, Urine: NEGATIVE mg/dL
Leukocyte Esterase, Urine: NEGATIVE
Nitrite, Urine: NEGATIVE
Protein, UA: NEGATIVE mg/dL
Specific Gravity, UA: 1.007 (ref 1.005–1.030)
Urobilinogen, Urine: 0.2 E.U./dL (ref <2.0)
pH, UA: 6.5 (ref 5.0–8.0)

## 2016-07-16 LAB — APTT: aPTT: 28.4 s (ref 24.1–34.9)

## 2016-07-16 LAB — TROPONIN
Troponin: <0.01 ng/mL (ref <0.01)
Troponin: <0.01 ng/mL (ref <0.01)

## 2016-07-16 LAB — PROTIME-INR
INR: 1.06 (ref 0.85–1.15)
Protime: 12 s (ref 9.6–13.0)

## 2016-07-16 MED ORDER — METOPROLOL TARTRATE 25 MG PO TABS
25 MG | Freq: Two times a day (BID) | ORAL | Status: DC
Start: 2016-07-16 — End: 2016-07-17
  Administered 2016-07-17 (×2): 25 mg via ORAL

## 2016-07-16 MED ORDER — MONTELUKAST SODIUM 10 MG PO TABS
10 MG | Freq: Every day | ORAL | Status: DC
Start: 2016-07-16 — End: 2016-07-17
  Administered 2016-07-16 – 2016-07-17 (×2): 10 mg via ORAL

## 2016-07-16 MED ORDER — NYSTATIN 100000 UNIT/GM EX CREA
100000 UNIT/GM | Freq: Two times a day (BID) | CUTANEOUS | Status: DC
Start: 2016-07-16 — End: 2016-07-17
  Administered 2016-07-16 – 2016-07-17 (×3): via TOPICAL

## 2016-07-16 MED ORDER — PANTOPRAZOLE SODIUM 40 MG PO TBEC
40 MG | Freq: Every day | ORAL | Status: DC
Start: 2016-07-16 — End: 2016-07-17
  Administered 2016-07-16 – 2016-07-17 (×2): 40 mg via ORAL

## 2016-07-16 MED ORDER — NORMAL SALINE FLUSH 0.9 % IV SOLN
0.9 % | Freq: Two times a day (BID) | INTRAVENOUS | Status: DC
Start: 2016-07-16 — End: 2016-07-17
  Administered 2016-07-16 – 2016-07-17 (×3): 10 mL via INTRAVENOUS

## 2016-07-16 MED ORDER — ENOXAPARIN SODIUM 40 MG/0.4ML SC SOLN
40 MG/0.4ML | Freq: Every day | SUBCUTANEOUS | Status: DC
Start: 2016-07-16 — End: 2016-07-17
  Administered 2016-07-16 – 2016-07-17 (×2): 40 mg via SUBCUTANEOUS

## 2016-07-16 MED ORDER — ALPRAZOLAM 0.25 MG PO TABS
0.25 MG | Freq: Two times a day (BID) | ORAL | Status: DC | PRN
Start: 2016-07-16 — End: 2016-07-17

## 2016-07-16 MED ORDER — POTASSIUM CHLORIDE CRYS ER 20 MEQ PO TBCR
20 MEQ | Freq: Every evening | ORAL | Status: DC
Start: 2016-07-16 — End: 2016-07-17
  Administered 2016-07-16: 22:00:00 20 meq via ORAL

## 2016-07-16 MED ORDER — ONDANSETRON HCL 4 MG/2ML IJ SOLN
4 MG/2ML | Freq: Four times a day (QID) | INTRAMUSCULAR | Status: DC | PRN
Start: 2016-07-16 — End: 2016-07-17

## 2016-07-16 MED ORDER — PROMETHAZINE HCL 25 MG PO TABS
25 MG | Freq: Four times a day (QID) | ORAL | Status: DC | PRN
Start: 2016-07-16 — End: 2016-07-17
  Administered 2016-07-16 – 2016-07-17 (×4): 12.5 mg via ORAL

## 2016-07-16 MED ORDER — ATORVASTATIN CALCIUM 80 MG PO TABS
80 MG | Freq: Every day | ORAL | Status: DC
Start: 2016-07-16 — End: 2016-07-17
  Administered 2016-07-16 – 2016-07-17 (×2): 80 mg via ORAL

## 2016-07-16 MED ORDER — TRIAMCINOLONE ACETONIDE 0.1 % EX CREA
0.1 % | Freq: Two times a day (BID) | CUTANEOUS | Status: DC
Start: 2016-07-16 — End: 2016-07-17
  Administered 2016-07-16 – 2016-07-17 (×3): via TOPICAL

## 2016-07-16 MED ORDER — PROMETHAZINE HCL 25 MG/ML IJ SOLN
25 MG/ML | Freq: Once | INTRAMUSCULAR | Status: AC
Start: 2016-07-16 — End: 2016-07-15
  Administered 2016-07-16: 12.5 mg via INTRAVENOUS

## 2016-07-16 MED ORDER — FUROSEMIDE 10 MG/ML IJ SOLN
10 MG/ML | Freq: Once | INTRAMUSCULAR | Status: AC
Start: 2016-07-16 — End: 2016-07-15
  Administered 2016-07-16: 40 mg via INTRAVENOUS

## 2016-07-16 MED ORDER — NORMAL SALINE FLUSH 0.9 % IV SOLN
0.9 % | INTRAVENOUS | Status: DC | PRN
Start: 2016-07-16 — End: 2016-07-17

## 2016-07-16 MED ORDER — CETIRIZINE HCL 10 MG PO TABS
10 MG | Freq: Every day | ORAL | Status: DC
Start: 2016-07-16 — End: 2016-07-17
  Administered 2016-07-16 – 2016-07-17 (×2): 10 mg via ORAL

## 2016-07-16 MED ORDER — NITROGLYCERIN 0.4 MG SL SUBL
0.4 MG | SUBLINGUAL | Status: DC | PRN
Start: 2016-07-16 — End: 2016-07-17

## 2016-07-16 MED ORDER — LISINOPRIL 20 MG PO TABS
20 MG | Freq: Every day | ORAL | Status: DC
Start: 2016-07-16 — End: 2016-07-17
  Administered 2016-07-16 – 2016-07-17 (×2): 40 mg via ORAL

## 2016-07-16 MED ORDER — BUSPIRONE HCL 5 MG PO TABS
5 MG | Freq: Three times a day (TID) | ORAL | Status: DC
Start: 2016-07-16 — End: 2016-07-17
  Administered 2016-07-16 – 2016-07-17 (×5): 10 mg via ORAL

## 2016-07-16 MED ORDER — SPIRONOLACTONE 25 MG PO TABS
25 MG | Freq: Every day | ORAL | Status: DC
Start: 2016-07-16 — End: 2016-07-17
  Administered 2016-07-16 – 2016-07-17 (×2): 25 mg via ORAL

## 2016-07-16 MED ORDER — OXYCODONE-ACETAMINOPHEN 5-325 MG PO TABS
5-325 MG | Freq: Four times a day (QID) | ORAL | Status: DC | PRN
Start: 2016-07-16 — End: 2016-07-17
  Administered 2016-07-16 – 2016-07-17 (×5): 1 via ORAL

## 2016-07-16 MED ORDER — METOPROLOL TARTRATE 50 MG PO TABS
50 MG | Freq: Two times a day (BID) | ORAL | Status: DC
Start: 2016-07-16 — End: 2016-07-16
  Administered 2016-07-16: 04:00:00 150 mg via ORAL

## 2016-07-16 MED ORDER — MAGNESIUM HYDROXIDE 400 MG/5ML PO SUSP
400 MG/5ML | Freq: Every day | ORAL | Status: DC | PRN
Start: 2016-07-16 — End: 2016-07-17

## 2016-07-16 MED ORDER — CALCIUM CARBONATE-VITAMIN D 500-200 MG-UNIT PO TABS
500-200 MG-UNIT | Freq: Every day | ORAL | Status: DC
Start: 2016-07-16 — End: 2016-07-17
  Administered 2016-07-16 – 2016-07-17 (×2): 1 via ORAL

## 2016-07-16 MED ORDER — PRASUGREL HCL 10 MG PO TABS
10 MG | Freq: Every day | ORAL | Status: DC
Start: 2016-07-16 — End: 2016-07-17
  Administered 2016-07-16 – 2016-07-17 (×2): 10 mg via ORAL

## 2016-07-16 MED ORDER — ALBUTEROL SULFATE HFA 108 (90 BASE) MCG/ACT IN AERS
108 (90 Base) MCG/ACT | Freq: Four times a day (QID) | RESPIRATORY_TRACT | Status: DC | PRN
Start: 2016-07-16 — End: 2016-07-17

## 2016-07-16 MED ORDER — ACETAMINOPHEN 325 MG PO TABS
325 MG | ORAL | Status: DC | PRN
Start: 2016-07-16 — End: 2016-07-17

## 2016-07-16 MED ORDER — ASPIRIN 81 MG PO CHEW
81 MG | Freq: Every day | ORAL | Status: DC
Start: 2016-07-16 — End: 2016-07-17
  Administered 2016-07-16 – 2016-07-17 (×2): 81 mg via ORAL

## 2016-07-16 MED ORDER — POTASSIUM CHLORIDE CRYS ER 20 MEQ PO TBCR
20 MEQ | Freq: Every evening | ORAL | Status: DC
Start: 2016-07-16 — End: 2016-07-16
  Administered 2016-07-16: 04:00:00 40 meq via ORAL

## 2016-07-16 MED ORDER — TRAZODONE HCL 50 MG PO TABS
50 MG | Freq: Every evening | ORAL | Status: DC
Start: 2016-07-16 — End: 2016-07-17
  Administered 2016-07-16 – 2016-07-17 (×2): 50 mg via ORAL

## 2016-07-16 MED ORDER — FUROSEMIDE 40 MG PO TABS
40 MG | Freq: Two times a day (BID) | ORAL | Status: DC
Start: 2016-07-16 — End: 2016-07-17
  Administered 2016-07-16 – 2016-07-17 (×3): 80 mg via ORAL

## 2016-07-16 MED ORDER — VENLAFAXINE HCL ER 150 MG PO CP24
150 MG | Freq: Every evening | ORAL | Status: DC
Start: 2016-07-16 — End: 2016-07-17
  Administered 2016-07-16 – 2016-07-17 (×2): 150 mg via ORAL

## 2016-07-16 MED FILL — TRIAMCINOLONE ACETONIDE 0.1 % EX CREA: 0.1 % | CUTANEOUS | Qty: 15

## 2016-07-16 MED FILL — BUSPIRONE HCL 5 MG PO TABS: 5 MG | ORAL | Qty: 2

## 2016-07-16 MED FILL — LIPITOR 80 MG PO TABS: 80 MG | ORAL | Qty: 1

## 2016-07-16 MED FILL — KLOR-CON M20 20 MEQ PO TBCR: 20 MEQ | ORAL | Qty: 1

## 2016-07-16 MED FILL — SPIRONOLACTONE 25 MG PO TABS: 25 MG | ORAL | Qty: 1

## 2016-07-16 MED FILL — ASPIRIN 81 MG PO CHEW: 81 MG | ORAL | Qty: 1

## 2016-07-16 MED FILL — FUROSEMIDE 40 MG PO TABS: 40 MG | ORAL | Qty: 2

## 2016-07-16 MED FILL — EFFEXOR XR 150 MG PO CP24: 150 MG | ORAL | Qty: 1

## 2016-07-16 MED FILL — NORMAL SALINE FLUSH 0.9 % IV SOLN: 0.9 % | INTRAVENOUS | Qty: 10

## 2016-07-16 MED FILL — TRAZODONE HCL 50 MG PO TABS: 50 MG | ORAL | Qty: 1

## 2016-07-16 MED FILL — OXYCODONE-ACETAMINOPHEN 5-325 MG PO TABS: 5-325 MG | ORAL | Qty: 1

## 2016-07-16 MED FILL — NITRO-BID 2 % TD OINT: 2 % | TRANSDERMAL | Qty: 1

## 2016-07-16 MED FILL — LISINOPRIL 20 MG PO TABS: 20 MG | ORAL | Qty: 2

## 2016-07-16 MED FILL — SINGULAIR 10 MG PO TABS: 10 MG | ORAL | Qty: 1

## 2016-07-16 MED FILL — EFFIENT 10 MG PO TABS: 10 MG | ORAL | Qty: 1

## 2016-07-16 MED FILL — PROAIR HFA 108 (90 BASE) MCG/ACT IN AERS: 108 (90 Base) MCG/ACT | RESPIRATORY_TRACT | Qty: 8.5

## 2016-07-16 MED FILL — PROMETHAZINE HCL 25 MG/ML IJ SOLN: 25 MG/ML | INTRAMUSCULAR | Qty: 1

## 2016-07-16 MED FILL — FUROSEMIDE 10 MG/ML IJ SOLN: 10 MG/ML | INTRAMUSCULAR | Qty: 4

## 2016-07-16 MED FILL — METOPROLOL TARTRATE 50 MG PO TABS: 50 MG | ORAL | Qty: 3

## 2016-07-16 MED FILL — NYSTATIN 100000 UNIT/GM EX CREA: 100000 UNIT/GM | CUTANEOUS | Qty: 15

## 2016-07-16 MED FILL — PROMETHAZINE HCL 25 MG PO TABS: 25 MG | ORAL | Qty: 1

## 2016-07-16 MED FILL — PANTOPRAZOLE SODIUM 40 MG PO TBEC: 40 MG | ORAL | Qty: 1

## 2016-07-16 MED FILL — ASPIRIN 325 MG PO TABS: 325 MG | ORAL | Qty: 1

## 2016-07-16 MED FILL — LOVENOX 40 MG/0.4ML SC SOLN: 40 MG/0.4ML | SUBCUTANEOUS | Qty: 0.4

## 2016-07-16 MED FILL — KLOR-CON M20 20 MEQ PO TBCR: 20 MEQ | ORAL | Qty: 2

## 2016-07-16 MED FILL — CETIRIZINE HCL 10 MG PO TABS: 10 MG | ORAL | Qty: 1

## 2016-07-16 MED FILL — OYST-CAL-D 500 500-200 MG-UNIT PO TABS: 500-200 MG-UNIT | ORAL | Qty: 1

## 2016-07-16 NOTE — Care Coordination-Inpatient (Signed)
[place] Transitions Interview     07/16/2016    Patient: Rachael Mays Patient DOB: September 19, 1956   MRN: [id number]  Reason for Admission: chest pain   RARS: [facility] Risk Score: 22.5       [name]      Readmission Risk  Patient Active Problem List   Diagnosis   ??? Back pain   ??? Deep vein thrombosis ([place])   ??? Depression   ??? Hyperlipidemia   ??? Pulmonary embolism ([place])   ??? Nausea & vomiting   ??? Abdominal  pain, other specified site   ??? Morbid obesity due to excess calories ([place])   ??? Status post gastric banding   ??? Vertigo   ??? Dizziness   ??? Tinnitus, subjective   ??? Other complications of gastric band procedure   ??? Essential hypertension   ??? Hyperglycemia   ??? Atypical chest pain   ??? 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 Adrenal mass, left ([place])   ??? Adrenal nodule ([place])   ??? Obstructive sleep apnea (adult) (pediatric)   ??? Dyspnea and respiratory abnormalities   ??? Abnormal stress test   ??? Shortness of breath   ??? Chronic diastolic congestive heart failure ([place])   ??? Coronary artery disease due to lipid rich plaque   ??? Pulmonary hypertension ([place])   ??? 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 Chest pain   ??? Acute actinic otitis externa of both ears   ??? Bilateral acute serous otitis media   ??? Anxiety and depression       Inpatient Assessment  Care Transitions Summary    Care Transitions Inpatient Review   Medication Review   Do you have all of your prescriptions and are they filled?:  Yes   Are you able to afford your medications?:  Yes   How often do you have difficulty taking your medications?:  I always take them as prescribed.         Housing Review   Who do you live with?:  Partner/Spouse/SO   Are you an active caregiver in your home?:  No            Social Support   Do you have a Case Worker?:  No   Do you have a Interior and spatial designer?:  No            Durable Medical Equipment            Functional Review   Ability to seek help/take action for  Emergent/Urgent situations i.e. fire, crime, inclement weather or health crisis.:  Independent   Ability handle personal hygiene needs (bathing/dressing/grooming):  Independent   Ability to manage medications:  Independent   Ability to prepare food:  Independent   Ability to maintain home (clean home, laundry):  Independent   Ability to drive and/or has transportation:  Independent   Ability to do shopping:  Independent   Ability to manage finances:  Independent   Is patient able to live independently?:  Yes            Hearing and Vision   Visual Impairment:  None   Hearing Impairment:  None            Care Transitions Interventions   No Identified Needs                 plans to return home with husband. No HHC or DME needed  at this time. Agreed to further CTC f/u calls after d/c    Follow Up  Future Appointments  Date Time Provider [place]   07/28/2016 10:00 AM Tobin Chad, MD [facility]   11/10/2016 12:20 PM Tiffanie Rita Ohara, CNP [facility]   12/28/2016 2:00 PM Francisca December, MD [facility]  There are no preventive care reminders to display for this patient.    Iver Nestle, RN

## 2016-07-16 NOTE — Consults (Signed)
[place]  [phone]        Reason for Consultation/Chief Complaint: "I have been having shortness of breath and chest pain."    History of Present Illness:  Rachael Mays is a 60 y.o. patient who presented to the hospital with complaints of 3 days of progressive shortness of breath and worsening chest pain.  She has history of chronic, recurrent left-sided chest discomfort.  In April 2017 she had cardiac cath with significant LAD stenosis.  She underwent PCI with DES of the LAD but continued to have chest discomfort.  She was also not to have CHF.  She was recently seen by me in office and was doing fairly well.  Approximately 3 days prior to admission she had onset of progressive shortness of breath and orthopnea.  She states that she felt as though she was swelling in her hands and feet.  Her chest pain intensified.  He currently still has chest discomfort and shortness of breath.    Past medical history includes normal coronaries angiogram in October 2016 and June 2013.  She underwent repeat cardiac cath in April 2017 and at that time was noted to have significant stenosis in the mid LAD which was revascularized with drug-eluting stent implantation.  This had no impact on her chest pain in terms of resolution.  She has morbid obesity and underwent lap band surgery.  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  She was seen by Dr. Jeb Levering who did a follow up CTA of her chest and CTPA  for continued shortness of breath 03/25/16 that showed no evidence of pulmonary embolism or acute pulmonary abnormality. A chest xray on the same day suggested congestive heart failure.  Her diuretics were increased and her swelling improved but she essentially had no improvement in her shortness of breath and chest pain.  The chest pain did have a component of tenderness on palpation in the past.  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  It was hypothesized that this may be contributing to the left-sided chest discomfort.  She has been seen by Dr. Diana Eves, pain management.  She was hospitalized 06/16/16-06/18/16 for chest pain.         Past Medical History:   has a past medical history of Anxiety; Asthma; Blood circulation, collateral; CAD (coronary artery disease); CHF (congestive heart failure) ([place]); Chronic back pain; Deep vein thrombosis; Depression; GERD (gastroesophageal reflux disease); GERD (gastroesophageal reflux disease); Hx of blood clots; Hyperlipidemia; Hypertension; Obesity; Obstructive sleep apnea (adult) (pediatric); and Pulmonary embolism ([place]).    Surgical History:   has a past surgical history that includes Colonoscopy; Cesarean section; Cholecystectomy; Hysterectomy; shoulder surgery; Lap Band (05/01/08); back surgery; Colonoscopy (04/08/10); other surgical history; Upper gastrointestinal endoscopy (05/19/13); other surgical history (07/05/13); Upper gastrointestinal endoscopy (07/21/13); partial hysterectomy (cervix not removed); Upper gastrointestinal endoscopy (N/A, 07/31/2014); vascular surgery (04/2015); Diagnostic Cardiac Cath Lab Procedure; Coronary angioplasty; Endoscopy, colon, diagnostic; Breast surgery; and Dilatation, esophagus.     Social History:   reports that she quit smoking about 3 years ago. Her smoking use included Cigarettes. She has a 20.00 pack-year smoking history. She quit smokeless tobacco use about 3 years ago. She reports that she drinks alcohol. She reports that she does not use illicit drugs.     Family History:  family history includes Arthritis in her mother; Cancer in her mother; Cancer (age  of onset: 71) in her father; Depression in her mother; Diabetes in an other family member; Early Death in her paternal grandmother; Heart Disease in her maternal grandmother and paternal grandfather; Heart Disease (age of onset: 48) in her mother; High Blood Pressure in her mother  and another family member; Obesity in an other family member; Other in her brother, brother, daughter, and sister; Ovarian Cancer (age of onset: 52) in her mother.    Home Medications:  Were reviewed and are listed in nursing record. and/or listed below  Prior to Admission medications    Medication Sig Start Date End Date Taking? Authorizing Provider   ALPRAZolam Duanne Moron) 0.25 MG tablet Take 1 tablet by mouth 2 times daily as needed for Anxiety 06/22/16  Yes Harvel Quale, MD   potassium chloride (KLOR-CON M) 20 MEQ extended release tablet Take 2 tablets by mouth 2 times daily 06/22/16  Yes Harvel Quale, MD   venlafaxine (EFFEXOR XR) 150 MG extended release capsule TAKE ONE CAPSULE BY MOUTH ONE TIME A DAY 06/10/16  Yes Harvel Quale, MD   furosemide (LASIX) 40 MG tablet Take 80 mg in am and 40 mg in pm 05/21/16  Yes Maebelle Munroe Helferich, CNP   nystatin (MYCOSTATIN) 100000 UNIT/GM cream Apply topically 2 times daily. 05/05/16  Yes Francisca December, MD   atorvastatin (LIPITOR) 80 MG tablet Take 1 tablet by mouth daily 04/30/16  Yes Leodis Binet, MD   lisinopril (PRINIVIL;ZESTRIL) 40 MG tablet Take 1 tablet by mouth daily Indications: Patient thought this was dc'd in December by PCP 04/09/16  Yes Harvel Quale, MD   omeprazole [facility]) 40 MG delayed release capsule Take 1 capsule by mouth daily 04/09/16  Yes Harvel Quale, MD   busPIRone (BUSPAR) 10 MG tablet Take 1 tablet by mouth 3 times daily 04/09/16  Yes Harvel Quale, MD   metoprolol (LOPRESSOR) 100 MG tablet TAKE TAKE 1 AND 1/2 TABLET BY MOUTH TABLETS BY MOUTH TWO TIMES A DAY 04/08/16  Yes Harvel Quale, MD   oxyCODONE-acetaminophen (PERCOCET) 5-325 MG per tablet Take 1 tablet by mouth every 6 hours as needed for Pain . 04/07/16  Yes Bhumit Patel, MD   nitroGLYCERIN (NITROSTAT) 0.4 MG SL tablet up to max of 3 total doses. If no relief after 1 dose, call 911. 04/06/16  Yes Bhumit Patel, MD   aspirin 81 MG EC  tablet Take 1 tablet by mouth daily 04/02/16  Yes Leodis Binet, MD   prasugrel (EFFIENT) 10 MG TABS Take 1 tablet by mouth daily 04/02/16  Yes Leodis Binet, MD   triamcinolone (KENALOG) 0.1 % cream Apply to affected areas twice daily for up to 2 weeks or until improved. 03/23/16  Yes Francisca December, MD   montelukast (SINGULAIR) 10 MG tablet TAKE ONE TABLET BY MOUTH ONE TIME A DAY 03/18/16  Yes Jamal Collin, MD   traZODone (DESYREL) 100 MG tablet TAKE 1 TABLET BY MOUTH NIGHTLY  Patient taking differently: TAKE 1/2 TABLET BY MOUTH NIGHTLY 01/02/16  Yes Harvel Quale, MD   albuterol sulfate HFA (PROVENTIL HFA) 108 (90 BASE) MCG/ACT inhaler Inhale 2 puffs into the lungs every 6 hours as needed for Wheezing 12/27/15  Yes Harvel Quale, MD   Blood Pressure Monitor KIT Use daily as needed 12/20/15  Yes Harvel Quale, MD   ondansetron (ZOFRAN) 4 MG tablet Take 1 tablet by  mouth daily as needed for Nausea or Vomiting 06/03/15  Yes Harvel Quale, MD   Multiple Vitamins-Minerals (MULTIVITAMIN PO) Take by mouth   Yes Historical Provider, MD   CALCIUM-VITAMIN D PO Take by mouth   Yes Historical Provider, MD   cetirizine (ZYRTEC ALLERGY) 10 MG tablet Take 10 mg by mouth daily.   Yes Historical Provider, MD        Allergies:  Morphine and Talwin [pentazocine]     Review of Systems:   A 10 point review of symptoms is negative except as noted in history of present illness.    Physical Examination:    Vitals:    07/16/16 0607   BP:    Pulse: 56   Resp:    Temp:    SpO2:     Weight: (!) 302 lb 6.4 oz (137.2 kg)         General Appearance:  Alert, cooperative, no distress, appears stated age   Head:  Normocephalic, without obvious abnormality, atraumatic   Eyes:  EOMI, conjunctiva/corneas clear       Nose: Nares normal   Throat: Lips normal   Neck: Supple, symmetrical, trachea midline,  no carotid bruit or JVD       Lungs:   Clear to auscultation bilaterally, respirations unlabored   Chest  Wall:  No tenderness or deformity   Heart:  Regular rate and rhythm, S1, S2 normal, no murmur, rub or gallop   Abdomen:   Soft, non-tender, bowel sounds active all four quadrants,  no masses, no organomegaly           Extremities: Extremities normal, atraumatic, no cyanosis or edema   Pulses: 2+ and symmetric   Skin: Skin color, texture, turgor normal, no rashes or lesions   Pysch: Normal mood and affect   Neurologic: Normal gross motor and sensory exam.         Labs  CBC:   Lab Results   Component Value Date    WBC 8.6 07/15/2016    RBC 4.97 07/15/2016    HGB 13.7 07/15/2016    HCT 42.6 07/15/2016    MCV 85.7 07/15/2016    RDW 16.1 07/15/2016    PLT 170 07/15/2016     CMP:    Lab Results   Component Value Date    NA 143 07/15/2016    K 3.9 07/15/2016    CL 103 07/15/2016    CO2 27 07/15/2016    BUN 12 07/15/2016    CREATININE 0.8 07/15/2016    GFRAA >60 07/15/2016    GFRAA >60 03/16/2012    AGRATIO 1.3 06/16/2016    LABGLOM >60 07/15/2016    GLUCOSE 112 07/15/2016    PROT 7.6 07/15/2016    PROT 7.3 03/31/2011    CALCIUM 9.6 07/15/2016    BILITOT 0.6 07/15/2016    ALKPHOS 122 07/15/2016    AST 38 07/15/2016    ALT 32 07/15/2016     PT/INR:  No results found for: PTINR  Lab Results   Component Value Date    CKTOTAL 157 07/15/2016    TROPONINI <0.01 07/16/2016       EKG:  I have reviewed EKG with the following interpretation:  Impression:  Eyes bradycardia otherwise normal    CTPA 06/16/16:  No evidence of pulmonary embolism.        No acute abnormality.      Cardiac monitor 06/01/16:  Predominantly sinus rhythm with occasional PACs and PVCs  Echo 04/06/16:  Summary   Normal left ventricle size and systolic function with an estimated ejection fraction of 65%.   There is mild concentric left ventricular hypertrophy.   Mild mitral regurgitation is present.   The aortic valve appears sclerotic but opens well.   There is mild tricuspid regurgitation with RVSP estimated at 42 mmHg.   Mild pulmonic regurgitation present.    Definity was used to better delineate endocardium.     Cardiac Cath 03/31/16:  Anatomy:   LM-normal   LAD-70% mid with FFR 0.77  Cx-normal, codominant  OM1- normal  RCA-normal  RPDA- normal  LVEF- 70%, LVEDP 19   PCI: LAD 70% to 0% with 3.25 mm x 18 mm Xience Alpine drug-eluting stent     Hemodynamics:  RA-15/12 mean 9  RV- 60/0, 9  PAWP-20/26 mean 19  PA- 60/12 mean 33        Impression:  1.  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  2.  Hyperdynamic LV systolic function.  3.  Mildly decompensated diastolic CHF.  4.  Moderate pulmonary hypertension.     Plan:  1.  Aspirin indefinitely.  2.  Effient/equivalent for minimum of 1 year.  3.  IV diuresis while in the hospital and then discharged on Lasix 40 mg by mouth twice a day.  4.  Continue BiPAP therapy.  5.  Outpatient cardiac rehab.  6.  CHF service follow-up on discharge.        CTPA 03/25/16:  No evidence of pulmonary embolism or acute pulmonary abnormality.     Chest x-ray 03/25/16:  No acute process.     ECG 25-Mar-2016 17:28:21 [facility]:  Sinus bradycardia  Otherwise normal ECG  No significant change     CTPA 03/10/16:  No evidence of pulmonary embolus.   Small right and trace left pleural effusions, new as well as interlobular   septal wall thickening.  Findings likely related to edema.   Mild mediastinal adenopathy, new.  Findings may be reactive.  Recommend   follow-up to ensure resolution.            Chest x-ray 02/13/16:  FINDINGS:   Cardiomegaly.  There appears be pulmonary vascular congestion and   interstitial edema.  No focal airspace disease.                Impression   Findings suggest congestive heart failure           Echo 10/07/15:  Summary   Technically limited study due to body habitus.   Normal left ventricle size and systolic function with an estimated ejection fraction of 55%. No regional wall motion abnormalities are seen.   There is mild concentric left ventricular hypertrophy.     Cardiac Cath  09/20/15:  Anatomy:   LM-normal  LAD-normal  Cx-normal  OM1- normal  RCA-normal, codominant  RPDA- normal  LVEF- 60%      Impression:  1. Normal coronaries  2. Normal LV systolic function  3. False positive stress test        Myoview Stress Test  09/18/15:  Summary    -Moderate sized anterior mostly reversible defect consistent with ischemia    in the territory of the mid LAD .    -Normal LV function.    -Study is limited by breast attenuation.         Cardiac cath 06/03/12:  Normal coronaries and LVEF        Assessment  Patient Active Problem List   Diagnosis   ??? Back pain   ??? Deep vein thrombosis ([place])   ??? Depression   ??? Hyperlipidemia   ??? Pulmonary embolism ([place])   ??? Nausea & vomiting   ??? Abdominal  pain, other specified site   ??? Morbid obesity due to excess calories ([place])   ??? Status post gastric banding   ??? Vertigo   ??? Dizziness   ??? Tinnitus, subjective   ??? Other complications of gastric band procedure   ??? Essential hypertension   ??? Hyperglycemia   ??? Atypical chest pain   ??? 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 Adrenal mass, left ([place])   ??? Adrenal nodule ([place])   ??? Obstructive sleep apnea (adult) (pediatric)   ??? Dyspnea and respiratory abnormalities   ??? Abnormal stress test   ??? Shortness of breath   ??? Chronic diastolic congestive heart failure ([place])   ??? Coronary artery disease due to lipid rich plaque   ??? Pulmonary hypertension ([place])   ??? 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 Chest pain   ??? Acute actinic otitis externa of both ears   ??? Bilateral acute serous otitis media   ??? Anxiety and depression         Plan:    I had the opportunity to review the clinical symptoms and presentation of Rachael Mays.  Complex situation.  She has chronic recurrent chest discomfort which mostly sounds atypical.  Intervention on her LAD did not significantly change symptoms.  She has the additional complaint of shortness of breath and describes orthopnea which is concerning for  possible CHF.    I recommend that the patient undergo repeat cardiac cath to evaluate the LAD.  I do not think small vessel disease is likely to be the cause of her chest pain.  Recommend diuresis with IV Lasix.  Will ask Dr. Jacqualyn Posey to see regarding possible CHF.    Thank you for allowing [place] to participate in the care of Rachael Mays. Further evaluation will be based upon the patient's clinical course and testing results.    All questions and concerns were addressed to the patient/family. Alternatives to my treatment were discussed. The note was completed using EMR. Every effort was made to ensure accuracy; however, inadvertent computerized transcription errors may be present.

## 2016-07-16 NOTE — Other (Addendum)
Patient Acct Nbr:  [id number]  Primary AUTH/CERT:    [place]netta Name:   [place] [facility]  Primary Insurance Plan Name:  [facility] [id number]  Primary Insurance Group Number:  [id number]  Primary Insurance Plan Type: N  Primary Insurance Policy Number:  [id number]

## 2016-07-16 NOTE — Progress Notes (Signed)
Perfect serve message sent to hospitalist x2 per pt request for phenergan vs zofran. Awaiting response.

## 2016-07-16 NOTE — Care Coordination-Inpatient (Signed)
Discharge Planning:  Per Leretha Dykes, RN Floor Nurse, patient was independent prior to admission, no discharge needs have been identified at this time.    Will Continue to be followed through discharge.  Onalee Hua, MSW, [place]

## 2016-07-16 NOTE — Progress Notes (Signed)
Hospitalist Progress Note      PCP: Harvel Quale, MD    Date of Admission: 07/15/2016    Chief Complaint: chest pain    Hospital Course:   61 y.o. female w/ known CAD who presented to [facility] with chest pain. She has chronic L-sided chest pain but states it was more intense today, also said she felt like she couldn't breathe - like her lungs are filled up. Has had some nausea but no vomiting, also has had lightheadedness for past 2 days. States she does have the "sweats" sometimes.  Pt is s/p cardiac cath in April 2017, which showed significant stenosis mid LAD - she is s/p DES but this did not resolve her CP. ??She was hospitalized a month ago again for CP, was seen by Cards and CP thought to be from small vessel dz, discharged home. She was seen outpt by her cardiologist, Dr. Susy Manor, 8 days ago on Aug 1 for hospital f/u - no med changes.  Pt's CXR does show new R basilar dz that could be pneumonia - however pt w/out fever or leukocytosis or change in cough so clinically do not think this is pneumonia - no abx at this time.  ??  Subjective:   Doing better today  Plan is for angiogram tomorrow.      Medications:  Reviewed    Infusion Medications    Scheduled Medications   ??? metoprolol  25 mg Oral BID   ??? spironolactone  25 mg Oral Daily   ??? potassium chloride  20 mEq Oral QPM   ??? sodium chloride flush  10 mL Intravenous 2 times per day   ??? aspirin  81 mg Oral Daily   ??? enoxaparin  40 mg Subcutaneous Daily   ??? atorvastatin  80 mg Oral Daily   ??? busPIRone  10 mg Oral TID   ??? calcium-vitamin D  1 tablet Oral Daily   ??? cetirizine  10 mg Oral Daily   ??? furosemide  80 mg Oral BID   ??? lisinopril  40 mg Oral Daily   ??? montelukast  10 mg Oral QAM AC   ??? nystatin   Topical BID   ??? pantoprazole  40 mg Oral QAM AC   ??? prasugrel  10 mg Oral Daily   ??? traZODone  50 mg Oral Nightly   ??? triamcinolone   Topical BID   ??? venlafaxine  150 mg Oral QPM     PRN Meds: promethazine, sodium chloride flush,  acetaminophen, magnesium hydroxide, ondansetron, nitroGLYCERIN, ALPRAZolam, albuterol sulfate HFA, oxyCODONE-acetaminophen      Intake/Output Summary (Last 24 hours) at 07/16/16 1857  Last data filed at 07/16/16 1819   Gross per 24 hour   Intake             1320 ml   Output             1350 ml   Net              -30 ml       Exam:    BP 126/63   Pulse 62   Temp 96.8 ??F (36 ??C) (Temporal)    Resp 18   Ht 5\' 6"  (1.676 m)   Wt (!) 302 lb 6.4 oz (137.2 kg)   SpO2 96%   BMI 48.81 kg/m2    General appearance: No apparent distress, appears stated age and cooperative.  HEENT: Pupils equal, round, and reactive to light. Conjunctivae/corneas clear.  Neck: Supple, with full range of motion. No jugular venous distention. Trachea midline.  Respiratory:  Normal respiratory effort. Clear to auscultation, bilaterally without Rales/Wheezes/Rhonchi.  Cardiovascular: Regular rate and rhythm with normal S1/S2 without murmurs, rubs or gallops.  Abdomen: Soft, non-tender, non-distended with normal bowel sounds.  Musculoskeletal: No clubbing, cyanosis or edema bilaterally.  Full range of motion without deformity.  Skin: Skin color, texture, turgor normal.  No rashes or lesions.  Neurologic:  Neurovascularly intact without any focal sensory/motor deficits. Cranial nerves: II-XII intact, grossly non-focal.  Psychiatric: Alert and oriented, thought content appropriate, normal insight  Capillary Refill: Brisk,< 3 seconds   Peripheral Pulses: +2 palpable, equal bilaterally       Labs:   Recent Labs      07/15/16   1938   WBC  8.6   HGB  13.7   HCT  42.6   PLT  170     Recent Labs      07/15/16   1938   NA  143   K  3.9   CL  103   CO2  27   BUN  12   CREATININE  0.8   CALCIUM  9.6     Recent Labs      07/15/16   1938   AST  38*   ALT  32   BILIDIR  <0.2   BILITOT  0.6   ALKPHOS  122     Recent Labs      07/15/16   1939   INR  1.06     Recent Labs      07/15/16   1938  07/15/16   2336  07/16/16   0318   CKTOTAL  157   --    --    TROPONINI   <0.01  <0.01  <0.01       Urinalysis:    Lab Results   Component Value Date    NITRU Negative 07/15/2016    WBCUA 4 07/16/2014    BACTERIA 0 12/19/2015    BACTERIA 2+ 07/16/2014    RBCUA 0 12/19/2015    RBCUA 2 07/16/2014    BLOODU Negative 07/15/2016    SPECGRAV 1.007 07/15/2016    GLUCOSEU Negative 07/15/2016       Radiology:  XR Chest Portable   Final Result   New mild right basilar airspace disease concerning for pneumonia.                 Assessment/Plan:    Active Hospital Problems    Diagnosis Date Noted   ??? Status post insertion of drug-eluting stent into left anterior descending (LAD) artery [Z95.5] 03/31/2016     Priority: High   ??? Pulmonary hypertension ([place]) [I27.2] 03/31/2016     Priority: High   ??? Coronary artery disease due to lipid rich plaque [I25.10, I25.83] 03/31/2016     Priority: High   ??? Chronic diastolic congestive heart failure ([place]) [I50.32] 03/30/2016     Priority: High   ??? Bradycardia [R00.1]    ??? Anxiety and depression [F41.9, F32.9] 07/15/2016   ??? Chest pain [R07.9] 04/05/2016   ??? Obstructive sleep apnea (adult) (pediatric) [G47.33] 09/18/2015   ??? Essential hypertension [I10] 07/24/2013   ??? Morbid obesity due to excess calories ([place]) [E66.01] 02/21/2009   ??? Hyperlipidemia [E78.5] 01/23/2009     1. SOB  - due to DIASTOLIC CHF  - HAS BEEN DIURESSING     2. CP  - HX of DUS.  -  CATH TOMORROW    3. Anxiety/depression - cont home meds - effexor XR, buspar, trazodone, prn xanax.  5. HTN - on metoprolol, lisinopril.  6. HLD - on statin. Normal lipid panel a month ago.  7. GERD - on PPI.  8. Chronic back pain - cont home dose of prn percocet.   9. Morbid obesity due to excess calories (Body mass index is 48.81 kg/(m^2).) - Complicating assessment and treatment. Placing patient at risk for multiple co-morbidities as well as early death and contributing to the patient's presentation.     DVT Prophylaxis: lovenox  Diet:  cardiacCode Status: Full Code    PT/OT Eval Status: not at this time     Dispo  [facility]    Vicente Masson, MD

## 2016-07-16 NOTE — Progress Notes (Signed)
Pt was very uncomfortable with the Medium/Large Nasel mask and requested to just keep Bipap off for the remaining of the night. Pt is stable at this time. 96% on RA

## 2016-07-16 NOTE — Consults (Signed)
[place]  Advanced CHF/Pulmonary Hypertension   Cardiac Evaluation      Rachael Mays  Date of Birth:  11/14/1956    Requesting Physician:  Dr. Susy Manor      Chief Complaint   Patient presents with   ??? Shortness of Breath     Pt c/o SOB and CP for 2/3 days.         History of Present Illness:  Rachael Mays is a 60 y.o. female w/ known CAD who presented to [facility] with chest pain. She has chronic L-sided chest pain but states it was more intense today, also said she felt like she couldn't breathe - like her lungs are filled up. Has had some nausea but no vomiting, also has had lightheadedness for past 2 days. States she does have the "sweats" sometimes.  Pt is s/p cardiac cath in April 2017, which showed significant stenosis mid LAD - she is s/p DES but this did not resolve her CP. ??She was hospitalized a month ago again for CP, was seen by Cards and CP thought to be from small vessel dz, discharged home. She was seen outpt by her cardiologist, Dr. Susy Manor, 8 days ago on Aug 1 for hospital f/u - no med changes.  Pt's CXR does show new R basilar dz that could be pneumonia - however pt w/out fever or leukocytosis or change in cough so clinically do not think this is pneumonia - no abx at this time.  She has been treated for chronic diastolic HF without significant improvement in symptoms.  She has very low heart rate on a high dose of metoprolol.  Was tolerating spironolactone but it was stopped by her PCP.    Allergies   Allergen Reactions   ??? Morphine Itching   ??? Talwin [Pentazocine] Other (See Comments)     dizzy     Current Facility-Administered Medications   Medication Dose Route Frequency Provider Last Rate Last Dose   ??? promethazine (PHENERGAN) tablet 12.5 mg  12.5 mg Oral Q6H PRN Vicente Masson, MD   12.5 mg at 07/16/16 0912   ??? sodium chloride flush 0.9 % injection 10 mL  10 mL Intravenous 2 times per day Brayton Mars, MD   10 mL at 07/16/16 [id number]   ??? sodium chloride flush 0.9 % injection  10 mL  10 mL Intravenous PRN Brayton Mars, MD       ??? acetaminophen (TYLENOL) tablet 650 mg  650 mg Oral Q4H PRN Brayton Mars, MD       ??? magnesium hydroxide (MILK OF MAGNESIA) 400 MG/5ML suspension 30 mL  30 mL Oral Daily PRN Brayton Mars, MD       ??? ondansetron [facility] injection 4 mg  4 mg Intravenous Q6H PRN Brayton Mars, MD       ??? aspirin chewable tablet 81 mg  81 mg Oral Daily Brayton Mars, MD   81 mg at 07/16/16 0911   ??? enoxaparin (LOVENOX) injection 40 mg  40 mg Subcutaneous Daily Brayton Mars, MD   40 mg at 07/16/16 [id number]   ??? nitroGLYCERIN (NITROSTAT) SL tablet 0.4 mg  0.4 mg Sublingual Q5 Min PRN Brayton Mars, MD       ??? ALPRAZolam Duanne Moron) tablet 0.25 mg  0.25 mg Oral BID PRN Brayton Mars, MD       ??? albuterol sulfate HFA 108 (90 Base) MCG/ACT inhaler 2 puff  2 puff Inhalation Q6H PRN Brayton Mars, MD       ???  atorvastatin (LIPITOR) tablet 80 mg  80 mg Oral Daily Brayton Mars, MD   80 mg at 07/16/16 0912   ??? busPIRone (BUSPAR) tablet 10 mg  10 mg Oral TID Brayton Mars, MD   10 mg at 07/16/16 0912   ??? calcium-vitamin D (OSCAL-500) 500-200 MG-UNIT per tablet 1 tablet  1 tablet Oral Daily Brayton Mars, MD   1 tablet at 07/16/16 0914   ??? cetirizine (ZYRTEC) tablet 10 mg  10 mg Oral Daily Brayton Mars, MD   10 mg at 07/16/16 0914   ??? furosemide (LASIX) tablet 80 mg  80 mg Oral BID Brayton Mars, MD   80 mg at 07/16/16 0913   ??? lisinopril (PRINIVIL;ZESTRIL) tablet 40 mg  40 mg Oral Daily Brayton Mars, MD   40 mg at 07/16/16 0911   ??? metoprolol tartrate (LOPRESSOR) tablet 150 mg  150 mg Oral BID Brayton Mars, MD   150 mg at 07/15/16 2339   ??? montelukast (SINGULAIR) tablet 10 mg  10 mg Oral QAM AC Brayton Mars, MD   10 mg at 07/16/16 0550   ??? nystatin (MYCOSTATIN) cream   Topical BID Brayton Mars, MD       ??? pantoprazole (PROTONIX) tablet 40 mg  40 mg Oral QAM AC Brayton Mars, MD   40 mg at 07/16/16 0550   ??? oxyCODONE-acetaminophen (PERCOCET) 5-325 MG per tablet 1 tablet  1 tablet Oral Q6H PRN Brayton Mars, MD   1  tablet at 07/16/16 0547   ??? potassium chloride (KLOR-CON M) extended release tablet 40 mEq  40 mEq Oral QPM Brayton Mars, MD   40 mEq at 07/15/16 2340   ??? prasugrel (EFFIENT) tablet 10 mg  10 mg Oral Daily Brayton Mars, MD   10 mg at 07/16/16 0912   ??? traZODone (DESYREL) tablet 50 mg  50 mg Oral Nightly Brayton Mars, MD   50 mg at 07/15/16 2340   ??? triamcinolone (KENALOG) 0.1 % cream   Topical BID Brayton Mars, MD       ??? venlafaxine (EFFEXOR XR) extended release capsule 150 mg  150 mg Oral QPM Brayton Mars, MD   150 mg at 07/15/16 2340       Past Medical History:   Diagnosis Date   ??? Anxiety    ??? Asthma    ??? Blood circulation, collateral    ??? CAD (coronary artery disease)    ??? CHF (congestive heart failure) ([place])    ??? Chronic back pain    ??? Deep vein thrombosis    ??? Depression    ??? GERD (gastroesophageal reflux disease)     NO LONGER SINCE LAP   ??? GERD (gastroesophageal reflux disease) 01/23/2009   ??? Hx of blood clots    ??? Hyperlipidemia     hx; resolved with lap band   ??? Hypertension    ??? Obesity     hx of; had lap band   ??? Obstructive sleep apnea (adult) (pediatric) 09/18/2015   ??? Pulmonary embolism [facility])      Past Surgical History:   Procedure Laterality Date   ??? BACK SURGERY      neck  plates   ??? BREAST SURGERY      left lumpectomy   ??? CESAREAN SECTION     ??? CHOLECYSTECTOMY     ??? COLONOSCOPY     ??? COLONOSCOPY  04/08/10   ??? CORONARY ANGIOPLASTY     ??? DIAGNOSTIC CARDIAC CATH LAB PROCEDURE     ???  DILATATION, ESOPHAGUS     ??? ENDOSCOPY, COLON, DIAGNOSTIC     ??? HYSTERECTOMY     ??? LAP BAND  05/01/08    Dr. Nicole Cella   ??? OTHER SURGICAL HISTORY      [place] Filter: curently in place   ??? OTHER SURGICAL HISTORY  07/05/13    lap band removal   ??? PARTIAL HYSTERECTOMY     ??? SHOULDER SURGERY     ??? UPPER GASTROINTESTINAL ENDOSCOPY  05/19/13   ??? UPPER GASTROINTESTINAL ENDOSCOPY  07/21/13    ESOPHAGEAL STENT PLACEMENT   ??? UPPER GASTROINTESTINAL ENDOSCOPY N/A 07/31/2014    Esophagogastroduodenoscopy with esophageal balloon dilation   ???  VASCULAR SURGERY  04/2015    Lucendia Herrlich, celiac artery angiogram, normal abd arteries     Family History   Problem Relation Age of Onset   ??? Arthritis Mother    ??? Cancer Mother    ??? Depression Mother    ??? High Blood Pressure Mother    ??? Heart Disease Mother 14     MI    ??? Ovarian Cancer Mother 97   ??? Cancer Father 38     colon   ??? Heart Disease Maternal Grandmother    ??? Early Death Paternal Grandmother    ??? Heart Disease Paternal Grandfather    ??? Diabetes Other    ??? High Blood Pressure Other    ??? Obesity Other    ??? Other Sister      OSA   ??? Other Brother      OSA   ??? Other Brother      OSA   ??? Other Daughter      OSA     Social History     Social History   ??? Marital status: Married     Spouse name: [name]   ??? Number of children: 2   ??? Years of education: 12     Occupational History   ??? Not on file.     Social History Main Topics   ??? Smoking status: Former Smoker     Packs/day: 0.50     Years: 40.00     Types: Cigarettes     Quit date: 08/07/2012   ??? Smokeless tobacco: Former Systems developer     Quit date: 06/16/2013      Comment: started to smoke at age 52 / only smoked 0.5 p.p.d    ??? Alcohol use 0.0 oz/week     0 Standard drinks or equivalent per week      Comment: occasionally    ??? Drug use: No   ??? Sexual activity: No     Other Topics Concern   ??? Not on file     Social History Narrative    ** Merged History Encounter **            Review of Systems:   ?? Constitutional: there has been no unanticipated weight loss. There's been no change in energy level, sleep pattern, or activity level.     ?? Eyes: No visual changes or diplopia. No scleral icterus.  ?? ENT: No Headaches, hearing loss or vertigo. No mouth sores or sore throat.  ?? Cardiovascular: Reviewed in HPI  ?? Respiratory: No cough or wheezing, no sputum production. No hematemesis.    ?? Gastrointestinal: No abdominal pain, appetite loss, blood in stools. No change in bowel or bladder habits.  ?? Genitourinary: No dysuria, trouble voiding, or hematuria.  ?? Musculoskeletal:  No gait  disturbance, weakness or  joint complaints.  ?? Integumentary: No rash or pruritis.  ?? Neurological: No headache, diplopia, change in muscle strength, numbness or tingling. No change in gait, balance, coordination, mood, affect, memory, mentation, behavior.  ?? Psychiatric: No anxiety, no depression.  ?? Endocrine: No malaise, fatigue or temperature intolerance. No excessive thirst, fluid intake, or urination. No tremor.  ?? Hematologic/Lymphatic: No abnormal bruising or bleeding, blood clots or swollen lymph nodes.  ?? Allergic/Immunologic: No nasal congestion or hives.    Physical Examination:    Vitals:    07/16/16 0415 07/16/16 0607 07/16/16 0900 07/16/16 1247   BP: (!) 154/79  125/70 124/77   Pulse: 60 56 54 56   Resp: 16  18 18    Temp: 96.8 ??F (36 ??C)  97.2 ??F (36.2 ??C) 96.1 ??F (35.6 ??C)   TempSrc: Temporal  Temporal Temporal   SpO2:   93% 95%   Weight: (!) 302 lb 6.4 oz (137.2 kg)      Height:         Body mass index is 48.81 kg/(m^2).     Wt Readings from Last 3 Encounters:   07/16/16 (!) 302 lb 6.4 oz (137.2 kg)   07/07/16 (!) 307 lb 1.9 oz (139.3 kg)   06/22/16 (!) 304 lb 6.4 oz (138.1 kg)     BP Readings from Last 3 Encounters:   07/16/16 124/77   07/07/16 132/80   06/22/16 130/72     Constitutional and General Appearance:   WD/WN in NAD, morbidly obese  HEENT:  NC/AT  Skin:  Warm, dry  Respiratory:  ?? Normal excursion and expansion without use of accessory muscles  ?? Resp Auscultation: Normal breath sounds without dullness  Cardiovascular:  ?? The apical impulses not displaced  ?? Heart tones are crisp and normal  ?? Cervical veins are not engorged  ?? The carotid upstroke is normal in amplitude and contour without delay or bruit  ?? JVP less than 8 cm H2O  RRR with nl S1 and S2 without m,r,g  ?? Peripheral pulses are symmetrical and full  ?? There is no clubbing, cyanosis of the extremities.  ?? No edema  ?? Femoral Arteries: 2+ and equal  ?? Pedal Pulses: 2+ and equal   Neck:  ?? JVP less than 8 cm H2O  ?? No  thyromegaly  Abdomen:  ?? No masses or tenderness  ?? Liver/Spleen: No Abnormalities Noted  Neurological/Psychiatric:  ?? Alert and oriented in all spheres  ?? Moves all extremities well  ?? Exhibits normal gait balance and coordination  ?? No abnormalities of mood, affect, memory, mentation, or behavior are noted    Echo:  5/17  Transthoracic Echocardiography Report (TTE)    ??Demographics  ??  ??Patient Name ?? ?? ?? Manasco Vernetta S  ??  ??Date of Study ?? ?? ??04/06/2016 ?? ?? ?? ??Gender ?? ?? ?? ?? ?? ?? ??Female  ??  ??Patient Number ?? ?? [id number] ?? ?? ?? ??Date of Birth ?? ?? ?? 25-Dec-1955  ??  ??Visit Number ?? ?? ?? [id number] ?? ?? ?? Age ?? ?? ?? ?? ?? ?? ?? ?? 59 year(s)  ??  ??Accession Number ?? [id number] ?? ?? ?? ?? Room Number ?? ?? ?? ?? 3319  ??  ??Corporate ID ?? ?? ?? [id number] ?? ?? ?? ?? ??Sonographer ?? ?? ?? ?? [employer], [place], [place]  ??  ??Ordering Physician Marygrace Drought, MD ??Interpreting ?? ?? ?? ??Hollie Beach, MD,  ???? ?? ?? ?? ?? ?? ?? ?? ?? ?? ?? ?? ?? ?? ?? ?? ?? ?? ??  Physician ?? ?? ?? ?? ?? FACC  ??  Procedure    Type of Study  ??  ??TTE procedure:ECHOCARDIOGRAM COMPLETE 2D W DOPPLER W COLOR.  ??  Procedure Date  Date: 04/06/2016 Start: 09:56 AM    Study Location: [facility] - Echo Lab  Technical Quality: Adequate visualization    Indications:Chest pain.    Patient Status: Routine    Height: 66 inches Weight: 310.01 pounds BSA: 2.41 m2 BMI: 50.04 kg/m2    BP: 120/73 mmHg    ??Conclusions  ??  ??Summary  ??Normal left ventricle size and systolic function with an estimated ejection  ??fraction of 65%.  ??There is mild concentric left ventricular hypertrophy.  ??Mild mitral regurgitation is present.  ??The aortic valve appears sclerotic but opens well.  ??There is mild tricuspid regurgitation with RVSP estimated at 42 mmHg.  ??Mild pulmonic regurgitation present.  ??Definity was used to better delineate endocardium.  Assessment:    1. Shortness of breath    2. Chest pain, unspecified type     3.  Chronic diastolic HF:  Appears compensated  4.  Bradycardia with high dose metoprolol    Plan:  1.   Reduce dose of metoprolol to 50 mg po bid  2.  Add spironolactone 25 mg po qd  3.  Reduce potassium to 20 meq po qd  4.  Cardiac cath tomorrow per Dr. Susy Manor.  5.  Continue other meds.    I appreciate the opportunity of cooperating in the care of this patient.    Marissa Nestle, M.D., [facility]

## 2016-07-17 ENCOUNTER — Encounter: Admit: 2016-07-17 | Attending: Cardiovascular Disease | Primary: Sports Medicine

## 2016-07-17 LAB — EKG 12-LEAD
Atrial Rate: 53 {beats}/min
P Axis: -9 degrees
P-R Interval: 144 ms
Q-T Interval: 450 ms
QRS Duration: 76 ms
QTc Calculation (Bazett): 422 ms
R Axis: -3 degrees
T Axis: 29 degrees
Ventricular Rate: 53 {beats}/min

## 2016-07-17 LAB — EJECTION FRACTION PERCENTAGE: Left Ventricular Ejection Fraction: 60 %

## 2016-07-17 MED ORDER — POTASSIUM CHLORIDE CRYS ER 20 MEQ PO TBCR
20 MEQ | ORAL_TABLET | Freq: Every day | ORAL | 3 refills | Status: DC
Start: 2016-07-17 — End: 2016-07-17

## 2016-07-17 MED ORDER — ACETAMINOPHEN 325 MG PO TABS
325 MG | ORAL | Status: DC | PRN
Start: 2016-07-17 — End: 2016-07-17

## 2016-07-17 MED ORDER — METOPROLOL TARTRATE 100 MG PO TABS
100 MG | ORAL_TABLET | Freq: Two times a day (BID) | ORAL | 1 refills | Status: DC
Start: 2016-07-17 — End: 2017-02-16

## 2016-07-17 MED ORDER — NORMAL SALINE FLUSH 0.9 % IV SOLN
0.9 % | INTRAVENOUS | Status: DC | PRN
Start: 2016-07-17 — End: 2016-07-17

## 2016-07-17 MED ORDER — MIDAZOLAM HCL 2 MG/2ML IJ SOLN
2 | INTRAMUSCULAR | Status: AC
Start: 2016-07-17 — End: 2016-07-17

## 2016-07-17 MED ORDER — FENTANYL CITRATE (PF) 100 MCG/2ML IJ SOLN
100 | INTRAMUSCULAR | Status: AC
Start: 2016-07-17 — End: 2016-07-17

## 2016-07-17 MED ORDER — HEPARIN (PORCINE) IN NACL 2-0.9 UNIT/ML-% IJ SOLN
2-0.9 | INTRAMUSCULAR | Status: AC
Start: 2016-07-17 — End: 2016-07-17

## 2016-07-17 MED ORDER — NITROGLYCERIN IN D5W 200-5 MCG/ML-% IV SOLN
200-5 | INTRAVENOUS | Status: AC
Start: 2016-07-17 — End: 2016-07-17

## 2016-07-17 MED ORDER — SPIRONOLACTONE 25 MG PO TABS
25 MG | ORAL_TABLET | Freq: Every day | ORAL | 3 refills | Status: DC
Start: 2016-07-17 — End: 2016-07-22

## 2016-07-17 MED ORDER — POTASSIUM CHLORIDE CRYS ER 20 MEQ PO TBCR
20 MEQ | ORAL_TABLET | Freq: Every day | ORAL | 3 refills | Status: DC
Start: 2016-07-17 — End: 2016-08-13

## 2016-07-17 MED ORDER — VERAPAMIL HCL 2.5 MG/ML IV SOLN
2.5 | INTRAVENOUS | Status: AC
Start: 2016-07-17 — End: 2016-07-17

## 2016-07-17 MED ORDER — SODIUM CHLORIDE 0.9 % IV SOLN
0.9 | INTRAVENOUS | Status: AC
Start: 2016-07-17 — End: 2016-07-17

## 2016-07-17 MED ORDER — NORMAL SALINE FLUSH 0.9 % IV SOLN
0.9 % | Freq: Two times a day (BID) | INTRAVENOUS | Status: DC
Start: 2016-07-17 — End: 2016-07-17

## 2016-07-17 MED ORDER — LIDOCAINE HCL (PF) 1 % IJ SOLN
1 | INTRAMUSCULAR | Status: AC
Start: 2016-07-17 — End: 2016-07-17

## 2016-07-17 MED ORDER — HEPARIN SODIUM (PORCINE) 1000 UNIT/ML IJ SOLN
1000 | INTRAMUSCULAR | Status: AC
Start: 2016-07-17 — End: 2016-07-17

## 2016-07-17 MED FILL — HEPARIN SODIUM (PORCINE) 1000 UNIT/ML IJ SOLN: 1000 UNIT/ML | INTRAMUSCULAR | Qty: 10

## 2016-07-17 MED FILL — PANTOPRAZOLE SODIUM 40 MG PO TBEC: 40 MG | ORAL | Qty: 1

## 2016-07-17 MED FILL — PROMETHAZINE HCL 25 MG PO TABS: 25 MG | ORAL | Qty: 1

## 2016-07-17 MED FILL — BUSPIRONE HCL 5 MG PO TABS: 5 MG | ORAL | Qty: 2

## 2016-07-17 MED FILL — LOVENOX 40 MG/0.4ML SC SOLN: 40 MG/0.4ML | SUBCUTANEOUS | Qty: 0.4

## 2016-07-17 MED FILL — EFFIENT 10 MG PO TABS: 10 MG | ORAL | Qty: 1

## 2016-07-17 MED FILL — FENTANYL CITRATE (PF) 100 MCG/2ML IJ SOLN: 100 MCG/2ML | INTRAMUSCULAR | Qty: 2

## 2016-07-17 MED FILL — VERAPAMIL HCL 2.5 MG/ML IV SOLN: 2.5 MG/ML | INTRAVENOUS | Qty: 2

## 2016-07-17 MED FILL — OXYCODONE-ACETAMINOPHEN 5-325 MG PO TABS: 5-325 MG | ORAL | Qty: 1

## 2016-07-17 MED FILL — MIDAZOLAM HCL 2 MG/2ML IJ SOLN: 2 MG/ML | INTRAMUSCULAR | Qty: 2

## 2016-07-17 MED FILL — EFFEXOR XR 150 MG PO CP24: 150 MG | ORAL | Qty: 1

## 2016-07-17 MED FILL — HEPARIN (PORCINE) IN NACL 2-0.9 UNIT/ML-% IJ SOLN: INTRAMUSCULAR | Qty: 1000

## 2016-07-17 MED FILL — SPIRONOLACTONE 25 MG PO TABS: 25 MG | ORAL | Qty: 1

## 2016-07-17 MED FILL — SINGULAIR 10 MG PO TABS: 10 MG | ORAL | Qty: 1

## 2016-07-17 MED FILL — LIDOCAINE HCL (PF) 1 % IJ SOLN: 1 % | INTRAMUSCULAR | Qty: 30

## 2016-07-17 MED FILL — NORMAL SALINE FLUSH 0.9 % IV SOLN: 0.9 % | INTRAVENOUS | Qty: 10

## 2016-07-17 MED FILL — LISINOPRIL 20 MG PO TABS: 20 MG | ORAL | Qty: 2

## 2016-07-17 MED FILL — LIPITOR 80 MG PO TABS: 80 MG | ORAL | Qty: 1

## 2016-07-17 MED FILL — CETIRIZINE HCL 10 MG PO TABS: 10 MG | ORAL | Qty: 1

## 2016-07-17 MED FILL — NITROGLYCERIN IN D5W 200-5 MCG/ML-% IV SOLN: 200-5 MCG/ML-% | INTRAVENOUS | Qty: 250

## 2016-07-17 MED FILL — OYST-CAL-D 500 500-200 MG-UNIT PO TABS: 500-200 MG-UNIT | ORAL | Qty: 1

## 2016-07-17 MED FILL — SODIUM CHLORIDE 0.9 % IV SOLN: 0.9 % | INTRAVENOUS | Qty: 1000

## 2016-07-17 MED FILL — ASPIRIN 81 MG PO CHEW: 81 MG | ORAL | Qty: 1

## 2016-07-17 MED FILL — METOPROLOL TARTRATE 25 MG PO TABS: 25 MG | ORAL | Qty: 1

## 2016-07-17 MED FILL — TRAZODONE HCL 50 MG PO TABS: 50 MG | ORAL | Qty: 1

## 2016-07-17 MED FILL — FUROSEMIDE 40 MG PO TABS: 40 MG | ORAL | Qty: 2

## 2016-07-17 NOTE — Progress Notes (Signed)
Data- discharge order received, pt verbalized agreement to discharge, disposition to previous residence, no needs for HHC/DME.     Action- discharge instructions prepared and given to patient, pt verbalized understanding. Medication information packet given r/t NEW and/or CHANGED prescriptions emphasizing name/purpose/side effects, pt verbalized understanding. Discharge instruction summary: Diet- cardiac, Activity- as tolerated, Primary Care Physician as follows: Harvel Quale, MD [phone] f/u appointment 9/7 with cardiology, immunizations reviewed and n/a, prescription medications filled here at outpatient pharmacy. Inpatient surgical procedure precautions reviewed: n/a CHF Education reviewed  30 minutes spent    Response- Pt belongings gathered, IV removed. Disposition is home (no HHC/DME needs), transported with husband, taken to lobby via w/c w/ RN, no complications.

## 2016-07-17 NOTE — Plan of Care (Signed)
Problem: OXYGENATION/RESPIRATORY FUNCTION  Goal: Patient will maintain patent airway  Intervention: POSITION PATIENT FOR MAXIMUM VENTILATORY EFFICIENCY  Pt tolerates room air and sats are stable. Pt denies SOB or chest pain currently.   Cardiac cath completed, medical management of chest pain and chf to continue.   Aldactone was added to her medications at d/c, potassium is 3.9.   No edema in lower extremities. resp even/easy.

## 2016-07-17 NOTE — Discharge Summary (Signed)
[place] [facility] DISCHARGE SUMMARY    Patient Demographics    Patient. Rachael Mays  Date of Birth. 10-15-1956  MRN. [id number]     Primary care provider. Acquanetta Sit SLOUGH, MD  (Tel: [phone])    Admit date: 07/15/2016    Discharge date (blank if same as Note Date):   Note Date: 07/17/2016     Reason for Hospitalization.   Chief Complaint   Patient presents with   ??? Shortness of Breath     Pt c/o SOB and CP for 2/3 days.          Significant Findings.   Principal Problem:    Chest pain  Active Problems:    Chronic diastolic congestive heart failure (HCC)    Coronary artery disease due to lipid rich plaque    Pulmonary hypertension (HCC)    Status post insertion of drug-eluting stent into left anterior descending (LAD) artery    Hyperlipidemia    Morbid obesity due to excess calories (HCC)    Essential hypertension    Obstructive sleep apnea (adult) (pediatric)    Anxiety and depression    Bradycardia       Problems and results from this hospitalization that need follow up.  1. None  Significant test results and incidental findings.  1.     Invasive procedures and treatments.   Cardiac Cath 07/17/16:  Anatomy:   LM-normal  LAD-widely patent stent otherwise normal  Cx-normal, dominant  OM1- normal  RCA-normal  LVEF- 70%, LVEDP 15 mmHg  ??  Impression:  1.  Widely patent LAD stent otherwise normal coronary arteries  2.  Hyperdynamic LV systolic function  3.  Non-cardiac chest pain  ??  Plan:  1.  Medical management  ??   ??     History Of Present Illness:    60 y.o. female w/ known CAD who presented to [facility] with chest pain. She has chronic L-sided chest pain but states it was more intense today, also said she felt like she couldn't breathe - like her lungs are filled up. Has had some nausea but no vomiting, also has had lightheadedness for past 2 days. States she does have the "sweats" sometimes.  Pt is s/p  cardiac cath in April 2017, which showed significant stenosis mid LAD - she is s/p DES but this did not resolve her CP. ??She was hospitalized a month ago again for CP, was seen by Cards and CP thought to be from small vessel dz, discharged home. She was seen outpt by her cardiologist, Dr. Susy Manor, 8 days ago on Aug 1 for hospital f/u - no med changes.  Pt's CXR does show new R basilar dz that could be pneumonia - however pt w/out fever or leukocytosis or change in cough so clinically do not think this is pneumonia - no abx at this time.  ??  [facility] Course.  Ms Lanese was admitted with chest pain. She underwent cath which showed her LAD stent to be patent. Cardiology was consulted during the stay. We did made medications adjustment with cutting down lopressor sec to bradycardia. Add aldactone and cutting down the dose of KCL replacement.        3. Anxiety/depression - cont home meds - effexor XR, buspar, trazodone, prn xanax.  5. HTN - on metoprolol, lisinopril.  6. HLD - on statin. Normal lipid panel a month ago.  7. GERD - on PPI.  8. Chronic back pain - cont home dose of  prn percocet.   9. Morbid obesity due to excess calories (Body mass index is 48.81 kg/(m^2).) - Complicating assessment and treatment. Placing patient at risk for multiple co-morbidities as well as early death and contributing to the patient's presentation.   Consults.  IP CONSULT TO HOSPITALIST  IP CONSULT TO CARDIOLOGY  IP CONSULT TO CARDIOLOGY    Physical examination on discharge day.   BP 110/67   Pulse 56   Temp 97 ??F (36.1 ??C) (Temporal)    Resp 16   Ht '5\' 6"'  (1.676 m)   Wt (!) 303 lb 8 oz (137.7 kg)   SpO2 91%   BMI 48.99 kg/m2  General appearance.  Alert. Looks comfortable.  HEENT. Sclera clear. Moist mucus membranes.  Cardiovascular. Regular rate and rhythm, normal S1, S2. No murmur.   Respiratory. Not using accessory muscles.Clear to auscultation bilaterally, no wheeze.  Gastrointestinal. Abdomen soft, non-tender, not  distended, normal bowel sounds  Neurology. Facial symmetry. No speech deficits. Moving all extremities equally.  Extremities. No edema in lower extremities.  Skin. Warm, dry, normal turgor    Condition at time of discharge stable     Medication instructions provided to patient at discharge.     Medication List      START taking these medications          spironolactone 25 MG tablet   Commonly known as:  ALDACTONE   Take 1 tablet by mouth daily         CHANGE how you take these medications          metoprolol 100 MG tablet   Commonly known as:  LOPRESSOR   Take 0.5 tablets by mouth 2 times daily   What changed:  See the new instructions.       potassium chloride 20 MEQ extended release tablet   Commonly known as:  KLOR-CON M   Take 1 tablet by mouth daily   What changed:    - how much to take  - when to take this       traZODone 100 MG tablet   Commonly known as:  DESYREL   TAKE 1 TABLET BY MOUTH NIGHTLY   What changed:  See the new instructions.         CONTINUE taking these medications          albuterol sulfate HFA 108 (90 Base) MCG/ACT inhaler   Commonly known as:  PROVENTIL HFA   Inhale 2 puffs into the lungs every 6 hours as needed for Wheezing       ALPRAZolam 0.25 MG tablet   Commonly known as:  XANAX   Take 1 tablet by mouth 2 times daily as needed for Anxiety       aspirin 81 MG EC tablet   Take 1 tablet by mouth daily       atorvastatin 80 MG tablet   Commonly known as:  LIPITOR   Take 1 tablet by mouth daily       Blood Pressure Monitor Kit   Use daily as needed       busPIRone 10 MG tablet   Commonly known as:  BUSPAR   Take 1 tablet by mouth 3 times daily       CALCIUM-VITAMIN D PO       furosemide 40 MG tablet   Commonly known as:  LASIX   Take 80 mg in am and 40 mg in pm       lisinopril 40 MG tablet  Commonly known as:  PRINIVIL;ZESTRIL   Take 1 tablet by mouth daily Indications: Patient thought this was dc'd in December by PCP       montelukast 10 MG tablet   Commonly known as:  SINGULAIR   TAKE  ONE TABLET BY MOUTH ONE TIME A DAY       MULTIVITAMIN PO       nitroGLYCERIN 0.4 MG SL tablet   Commonly known as:  NITROSTAT   up to max of 3 total doses. If no relief after 1 dose, call 911.       nystatin 100000 UNIT/GM cream   Commonly known as:  MYCOSTATIN   Apply topically 2 times daily.       omeprazole 40 MG delayed release capsule   Commonly known as:  PRILOSEC   Take 1 capsule by mouth daily       ondansetron 4 MG tablet   Commonly known as:  ZOFRAN   Take 1 tablet by mouth daily as needed for Nausea or Vomiting       oxyCODONE-acetaminophen 5-325 MG per tablet   Commonly known as:  PERCOCET   Take 1 tablet by mouth every 6 hours as needed for Pain .       prasugrel 10 MG Tabs   Commonly known as:  EFFIENT   Take 1 tablet by mouth daily       triamcinolone 0.1 % cream   Commonly known as:  KENALOG   Apply to affected areas twice daily for up to 2 weeks or until improved.       venlafaxine 150 MG extended release capsule   Commonly known as:  EFFEXOR XR   TAKE ONE CAPSULE BY MOUTH ONE TIME A DAY       ZYRTEC ALLERGY 10 MG tablet   Generic drug:  cetirizine            Where to Get Your Medications      These medications were sent to [facility] [address], [place] [phone] Wanda Plump [phone]  [address], [place] [address]     Phone:  [phone]    ??? metoprolol 100 MG tablet   ??? potassium chloride 20 MEQ extended release tablet         You can get these medications from any pharmacy     Bring a paper prescription for each of these medications    ??? spironolactone 25 MG tablet             Discharge recommendations given to patient.  Follow Up.PCP in 1 week and as mentioned in discharge instructions    Disposition.  home  Activity. activity as tolerated  Diet: DIET CARDIAC;      Spent 25 minutes in discharge process.    Signed:  Auburn Bilberry, MD     07/17/2016 11:29 AM

## 2016-07-17 NOTE — Progress Notes (Signed)
Pt returned from cath lab. Right radial site has no signs of bleeding. Remove 2 ml/air from TR band q15 min until all 10 Ml have been removed. Will monitor.

## 2016-07-17 NOTE — Brief Op Note (Signed)
Cardiac Cath 07/17/16:  Anatomy:   LM-normal  LAD-widely patent stent otherwise normal  Cx-normal, dominant  OM1- normal  RCA-normal  LVEF- 70%, LVEDP 15 mmHg    Impression:  1.  Widely patent LAD stent otherwise normal coronary arteries  2.  Hyperdynamic LV systolic function  3.  Non-cardiac chest pain    Plan:  1.  Medical management

## 2016-07-17 NOTE — Plan of Care (Signed)
Problem: Cardiovascular  Goal: Hemodynamic stability  Outcome: Ongoing  Continue to monitor labs vitals heart rate and rhythm daily weights I&O's peripheral edema lung sounds and medications as directed

## 2016-07-17 NOTE — Pre Sedation (Signed)
Brief Pre-Op Note/Sedation Assessment      Rachael Mays  Aug 22, 1956  [id number]  [id number]  9:15 AM    Planned Procedure: Cardiac Catheterization Procedure    Post Procedure Plan: Return to same level of care    Consent: I have discussed with the patient and/or the patient representative the indication, alternatives, and the possible risks and/or complications of the planned procedure and the anesthesia methods. The patient and/or patient representative appear to understand and agree to proceed.    Chief Complaint: Chest Pain/Pressure      Indications for the Procedure:   CAD Presentation:  Unstable Angina -  New onset angina (within the past 2 months of at least class III severity)  Anginal Classification within 2 weeks:  CCS III - Symptoms with everyday living activities, i.e., moderate limitation  NYHA Heart Failure Class within 2 weeks: No symptoms      Anti- Anginal Meds within 2 weeks:   ANTI-ANGINAL MEDS: Yes: Beta Blockers      Stress or Imaging Studies Performed:  None    Vital Signs:  BP (!) 177/68   Pulse 72   Temp 97.7 ??F (36.5 ??C) (Temporal)    Resp 16   Ht 5\' 6"  (1.676 m)   Wt (!) 303 lb 8 oz (137.7 kg)   SpO2 97%   BMI 48.99 kg/m2    Allergies:  Allergies   Allergen Reactions   ??? Morphine Itching   ??? Talwin [Pentazocine] Other (See Comments)     dizzy       Past Medical History:  Past Medical History:   Diagnosis Date   ??? Anxiety    ??? Asthma    ??? Blood circulation, collateral    ??? CAD (coronary artery disease)    ??? CHF (congestive heart failure) ([place])    ??? Chronic back pain    ??? Deep vein thrombosis    ??? Depression    ??? GERD (gastroesophageal reflux disease)     NO LONGER SINCE LAP   ??? GERD (gastroesophageal reflux disease) 01/23/2009   ??? Hx of blood clots    ??? Hyperlipidemia     hx; resolved with lap band   ??? Hypertension    ??? Obesity     hx of; had lap band   ??? Obstructive sleep apnea (adult) (pediatric) 09/18/2015   ??? Pulmonary embolism [facility])          Surgical History:  Past Surgical  History:   Procedure Laterality Date   ??? BACK SURGERY      neck  plates   ??? BREAST SURGERY      left lumpectomy   ??? CESAREAN SECTION     ??? CHOLECYSTECTOMY     ??? COLONOSCOPY     ??? COLONOSCOPY  04/08/10   ??? CORONARY ANGIOPLASTY     ??? DIAGNOSTIC CARDIAC CATH LAB PROCEDURE     ??? DILATATION, ESOPHAGUS     ??? ENDOSCOPY, COLON, DIAGNOSTIC     ??? HYSTERECTOMY     ??? LAP BAND  05/01/08    Dr. Nicole Cella   ??? OTHER SURGICAL HISTORY      [place] Filter: curently in place   ??? OTHER SURGICAL HISTORY  07/05/13    lap band removal   ??? PARTIAL HYSTERECTOMY     ??? SHOULDER SURGERY     ??? UPPER GASTROINTESTINAL ENDOSCOPY  05/19/13   ??? UPPER GASTROINTESTINAL ENDOSCOPY  07/21/13    ESOPHAGEAL STENT PLACEMENT   ??? UPPER GASTROINTESTINAL  ENDOSCOPY N/A 07/31/2014    Esophagogastroduodenoscopy with esophageal balloon dilation   ??? VASCULAR SURGERY  04/2015    Lucendia Herrlich, celiac artery angiogram, normal abd arteries         Medications:  Current Facility-Administered Medications   Medication Dose Route Frequency Provider Last Rate Last Dose   ??? promethazine (PHENERGAN) tablet 12.5 mg  12.5 mg Oral Q6H PRN Vicente Masson, MD   12.5 mg at 07/17/16 0226   ??? metoprolol tartrate (LOPRESSOR) tablet 25 mg  25 mg Oral BID Connye Burkitt, MD   25 mg at 07/17/16 0845   ??? spironolactone (ALDACTONE) tablet 25 mg  25 mg Oral Daily Connye Burkitt, MD   25 mg at 07/16/16 1818   ??? potassium chloride (KLOR-CON M) extended release tablet 20 mEq  20 mEq Oral QPM Connye Burkitt, MD   20 mEq at 07/16/16 1819   ??? sodium chloride flush 0.9 % injection 10 mL  10 mL Intravenous 2 times per day Brayton Mars, MD   10 mL at 07/16/16 2340   ??? sodium chloride flush 0.9 % injection 10 mL  10 mL Intravenous PRN Brayton Mars, MD       ??? acetaminophen (TYLENOL) tablet 650 mg  650 mg Oral Q4H PRN Brayton Mars, MD       ??? magnesium hydroxide (MILK OF MAGNESIA) 400 MG/5ML suspension 30 mL  30 mL Oral Daily PRN Brayton Mars, MD       ??? ondansetron [facility]) injection 4 mg  4 mg Intravenous Q6H PRN  Brayton Mars, MD       ??? aspirin chewable tablet 81 mg  81 mg Oral Daily Brayton Mars, MD   81 mg at 07/17/16 0845   ??? enoxaparin (LOVENOX) injection 40 mg  40 mg Subcutaneous Daily Brayton Mars, MD   40 mg at 07/16/16 0914   ??? nitroGLYCERIN (NITROSTAT) SL tablet 0.4 mg  0.4 mg Sublingual Q5 Min PRN Brayton Mars, MD       ??? ALPRAZolam Duanne Moron) tablet 0.25 mg  0.25 mg Oral BID PRN Brayton Mars, MD       ??? albuterol sulfate HFA 108 (90 Base) MCG/ACT inhaler 2 puff  2 puff Inhalation Q6H PRN Brayton Mars, MD       ??? atorvastatin (LIPITOR) tablet 80 mg  80 mg Oral Daily Brayton Mars, MD   80 mg at 07/16/16 0912   ??? busPIRone (BUSPAR) tablet 10 mg  10 mg Oral TID Brayton Mars, MD   10 mg at 07/16/16 2341   ??? calcium-vitamin D (OSCAL-500) 500-200 MG-UNIT per tablet 1 tablet  1 tablet Oral Daily Brayton Mars, MD   1 tablet at 07/16/16 0914   ??? cetirizine (ZYRTEC) tablet 10 mg  10 mg Oral Daily Brayton Mars, MD   10 mg at 07/16/16 0914   ??? furosemide (LASIX) tablet 80 mg  80 mg Oral BID Brayton Mars, MD   80 mg at 07/16/16 1819   ??? lisinopril (PRINIVIL;ZESTRIL) tablet 40 mg  40 mg Oral Daily Brayton Mars, MD   40 mg at 07/17/16 0845   ??? montelukast (SINGULAIR) tablet 10 mg  10 mg Oral QAM AC Brayton Mars, MD   10 mg at 07/17/16 0530   ??? nystatin (MYCOSTATIN) cream   Topical BID Brayton Mars, MD       ??? pantoprazole (PROTONIX) tablet 40 mg  40 mg Oral QAM AC Brayton Mars, MD   40 mg at 07/17/16 0530   ??? oxyCODONE-acetaminophen (PERCOCET)  5-325 MG per tablet 1 tablet  1 tablet Oral Q6H PRN Brayton Mars, MD   1 tablet at 07/17/16 0530   ??? prasugrel (EFFIENT) tablet 10 mg  10 mg Oral Daily Brayton Mars, MD   10 mg at 07/16/16 0912   ??? traZODone (DESYREL) tablet 50 mg  50 mg Oral Nightly Brayton Mars, MD   50 mg at 07/16/16 2341   ??? triamcinolone (KENALOG) 0.1 % cream   Topical BID Brayton Mars, MD       ??? venlafaxine (EFFEXOR XR) extended release capsule 150 mg  150 mg Oral QPM Brayton Mars, MD   150 mg at 07/16/16 2341            Pre-Sedation:    Pre-Sedation Documentation and Exam:  I have personally completed a history, physical exam & review of systems for this patient (see notes).  I have assessed the patient and agree with the H&P present on the chart.    Prior History of Anesthesia Complications:   none    Modified Mallampati:  III (soft palate, base of uvula visible)    ASA Classification:  Class 2 -- A normal healthy patient with mild systemic disease    Aldrete Scale:  Activity:  2 - Able to move 4 extremities voluntarily on command  Respiration:  2 - Able to breathe deeply and cough freely  Circulation:  2 - BP+/- 73mmHg of normal  Consciousness:  2 - Fully awake  Oxygen Saturation (color):  2 - Able to maintain oxygen saturation >92% on room air    Sedation/Anesthesia Plan:  Guard the patient's safety and welfare.  Minimize physical discomfort and pain.  Minimize negative psychological responses to treatment by providing sedation and analgesia and maximize the potential amnesia.  Patient to meet pre-procedure discharge plan.    Medication Planned:  midazolam intravenously, fentanyl intravenously    Patient is an appropriate candidate for plan of sedation: yes      Electronically signed by Leodis Binet, MD on 07/17/2016 at 9:15 AM

## 2016-07-17 NOTE — Discharge Instructions - Pharmacy (Signed)
Thank you for choosing [facility]!  You will receive a survey in the mail regarding the care you received during your stay.  Your input is valuable to us. Our goal is that the care you received was a 10/10. We hope that you will definitely recommend us to your friends and family and choose us for your future healthcare needs

## 2016-07-17 NOTE — Progress Notes (Signed)
Spoke with Optometrist.  Follow up appointment for August 13, 2016 is okay per Dr. Susy Manor.

## 2016-07-17 NOTE — Progress Notes (Signed)
[facility]   Progress Note  CHF/Pulmonary Hypertension Cardiology    Chief complaint:  We are following this patient for chronic diastolic HF  HPI:  Rachael Mays is a 60 y.o.??female??w/ known CAD who presented to [facility]??with chest pain. She has chronic L-sided chest pain but states it was more intense today, also said she felt like she couldn't breathe - like her lungs are filled up. Has had some nausea but no vomiting, also has had lightheadedness for past 2 days. States she does have the "sweats" sometimes.  Pt is s/p cardiac cath in April 2017, which showed??significant stenosis mid LAD - she is s/p DES but this did not resolve her CP. ??She was??hospitalized a month ago again??for CP, was seen by Cards and CP thought to be from small vessel dz, discharged home. She was seen outpt by her cardiologist, Dr. Susy Manor, 8 days ago on Aug 1 for hospital f/u - no med changes.  Pt's CXR does show new R basilar dz that could be pneumonia - however pt w/out fever or leukocytosis or change in cough so clinically do not think this is pneumonia - no abx at this time.  She has been treated for chronic diastolic HF without significant improvement in symptoms.  She has very low heart rate on a high dose of metoprolol.  Was tolerating spironolactone but it was stopped by her PCP.    ROS:  She had a cardiac cath today that showed widely patent coronary arteries and stent.  She looks better.  We have reduced her metoprolol significantly to see if this give her more breath.  She is going home tonight  Spironolactone also started    Medications/Labs all Reviewed    Lab Results   Component Value Date    WBC 8.6 07/15/2016    HGB 13.7 07/15/2016    HCT 42.6 07/15/2016    MCV 85.7 07/15/2016    PLT 170 07/15/2016     Lab Results   Component Value Date    CREATININE 0.8 07/15/2016    BUN 12 07/15/2016    NA 143 07/15/2016    K 3.9 07/15/2016    CL 103 07/15/2016    CO2 27 07/15/2016     Lab Results   Component Value  Date    INR 1.06 07/15/2016    PROTIME 12.0 07/15/2016        Physical Examination:    BP 130/62   Pulse 57   Temp 97.9 ??F (36.6 ??C) (Temporal)    Resp 16   Ht 5\' 6"  (1.676 m)   Wt (!) 303 lb 8 oz (137.7 kg)   SpO2 93%   BMI 48.99 kg/m2     Morbidly obese  Respiratory:  ?? Resp Assessment: Normal respiratory effort  ?? Resp Auscultation: Clear to auscultation bilaterally   Cardiovascular:  ?? Auscultation: regular rate and rhythm, normal S1S2, no murmur, rub or gallop  ?? Palpation:  Nl PMI  ?? JVP:  normal  ?? Extremities: No Edema  Abdomen:  ?? Soft, non-tender  ?? Normal bowel sounds  Extremities:  ??  No Cyanosis or Clubbing  Neurological/Psychiatric:  ?? Oriented to time, place, and person  ?? Non-anxious  Skin Warm and dry    Lab Results   Component Value Date    NA 143 07/15/2016    NA 142 06/29/2016    NA 144 06/17/2016    K 3.9 07/15/2016    K 4.4 06/29/2016    K  3.8 06/17/2016    BUN 12 07/15/2016    BUN 14 06/29/2016    BUN 16 06/17/2016    CREATININE 0.8 07/15/2016    CREATININE 0.8 06/29/2016    CREATININE 1.0 06/17/2016    GLUCOSE 112 07/15/2016    GLUCOSE 119 06/29/2016     Lab Results   Component Value Date    PROBNP 151 (H) 07/15/2016    PROBNP 216 (H) 06/16/2016    PROBNP 521 (H) 03/25/2016     Lab Results   Component Value Date    ALT 32 07/15/2016    ALT 41 (H) 06/16/2016    AST 38 (H) 07/15/2016    AST 27 06/16/2016     Lab Results   Component Value Date    HGB 13.7 07/15/2016    HGB 12.5 06/17/2016    HCT 42.6 07/15/2016    HCT 39.4 06/17/2016    PLT 170 07/15/2016    PLT 152 06/17/2016     Lab Results   Component Value Date    TRIG 114 06/17/2016    TRIG 100 05/21/2016    HDL 43 06/17/2016    HDL 42 05/21/2016    HDL 52 03/31/2011    HDL 53 03/26/2010    LDLCALC 52 06/17/2016    LDLCALC 65 05/21/2016       Assessment:    1. Shortness of breath    2. Chest pain, unspecified type     3.  Chronic diastolic HF:  Appears compensated  4.  Bradycardia with high dose metoprolol  ??  Plan:  1. Continue metoprolol  50 mg po bid (down from 150 bid)  2.  continuespironolactone 25 mg po qd  3.  continue potassium to 20 meq po qd  4.  Needs bmp in 1 week  5.  Continue other meds.  6.  Follow up Sept 7.      NYHA Class: 3    Connye Burkitt, MD, 07/17/2016 4:28 PM

## 2016-07-18 NOTE — Care Coordination-Inpatient (Signed)
[facility] Transitions Initial Follow Up Call    Call within 2 business days of discharge: Yes    Patient: Rachael Mays Patient DOB: December 26, 1955   MRN: [id number]    Reason for Admission: Chest Pain  Discharge Date: 07/17/16   RARS: Geisinger Risk Score: 22.5      Facility: [facility]    Non-face-to-face services provided:  Obtained and reviewed discharge summary and/or continuity of care documents      Attempted to reach patient by phone for post acute care 24 hour follow up call. No answer , left a message to return call to Aretta Nip, RN at [phone]    Follow Up  Future Appointments  Date Time Provider [place]   07/28/2016 10:00 AM Tobin Chad, MD [facility]   08/13/2016 1:00 PM Dorian Furnace, CNP [facility]   11/10/2016 12:20 PM Tiffanie Rita Ohara, CNP [facility]   12/28/2016 2:00 PM Francisca December, MD [facility]       Adiva Boettner Loura Pardon, RN

## 2016-07-19 NOTE — Care Coordination-Inpatient (Signed)
[facility] Transitions Initial Follow Up Call    Call within 2 business days of discharge: Yes    Patient: Rachael Mays Patient DOB: [birth year]   MRN: [id number]  Reason for Admission: Chest Pain  Discharge Date: 07/17/16 RARS: Geisinger Risk Score: 22.5     Spoke with: No answer, left message on voice mail on two different numbers, both home and mobile    Facility: [facility]    Non-face-to-face services provided:  Obtained and reviewed discharge summary and/or continuity of care documents    CTC second attempt to reach patient or patient family member for discharge follow up call . Voice mail left with reason for call and contact information. Encouraged patient or family to return call.    Follow Up  Future Appointments  Date Time Provider [place]   07/28/2016 10:00 AM Tobin Chad, MD [facility]   08/13/2016 1:00 PM Dorian Furnace, CNP [facility]   11/10/2016 12:20 PM Tiffanie Rita Ohara, CNP [facility]   12/28/2016 2:00 PM Francisca December, MD [facility]       Angla Delahunt Loura Pardon, RN

## 2016-07-20 NOTE — Care Coordination-Inpatient (Signed)
[facility] Transitions Initial Follow Up Call    Call within 2 business days of discharge: Yes    Patient: Rachael Mays Patient DOB: 1956/05/29   MRN: [id number]  Reason for Admission: chest pain  Discharge Date: 07/17/16 RARS: Geisinger Risk Score: 22.5       3rd and final attempt to reach pt for initial transition call    Follow Up  Future Appointments  Date Time Provider [place]   07/28/2016 10:00 AM Tobin Chad, MD [facility]   08/13/2016 1:00 PM Dorian Furnace, CNP [facility]   11/10/2016 12:20 PM Tiffanie Rita Ohara, CNP [facility]   12/28/2016 2:00 PM Francisca December, MD [facility]       Iver Nestle, RN

## 2016-07-21 NOTE — Telephone Encounter (Signed)
Dorian Furnace, CNP,  - Ok to update spironolactone rx to 90-day supply for copay savings to the patient? Order pended for your modification/signature if you agree.    Thank you,  Charlesetta Garibaldi Seidy Labreck, PharmD, [place]  Direct: [phone]  Department, toll free: [phone], option 7   =======================================================  CLINICAL PHARMACY NOTE - Medication Review  Patient outreach to review medications - Spoke with patient.      SUBJECTIVE/OBJECTIVE:   Rachael Mays is a 60 y.o. female referred to a clinical pharmacy specialist given history of COPD (OSA, dyspnea and SOB dx's on problem list; as per PFTs below - no obstructive lung disease) and number of home medications.    Medications as per home medication list, also noting:  - Reports taking 1/4 (25mg ) metoprolol BID because recent hospital discharge AVS listed last dose as 25mg   - Asks for spironolactone rx to be #90 for Mail Order Pharmacy  - Reports infrequent use of albuterol - "I can't remember the last time I used that"  - Has increased trazodone to full/whole tablet nightly since recent hospital discharge - reports improvement in symptoms    BP Readings from Last 3 Encounters:   07/17/16 130/62   07/07/16 132/80   06/22/16 130/72     Pulse Readings from Last 3 Encounters:   07/17/16 57   07/07/16 64   06/22/16 62     Tobacco History:   History   Smoking Status   ??? Former Smoker   ??? Packs/day: 0.50   ??? Years: 40.00   ??? Types: Cigarettes   ??? Quit date: 08/07/2012   Smokeless Tobacco   ??? Former User   ??? Quit date: 06/16/2013     Comment: started to smoke at age 28 / only smoked 0.5 p.p.d      Immunizations:   Most Recent Immunizations   Administered Date(s) Administered   ??? Tdap (Boostrix, Adacel) 11/07/2011     Pulmonary Functions Testing Results:  11/09/15: No obstructive or restrictive pattern seen on this pulmonary  function test with no response to bronchodilators.   11/20/14: No obstructive  defect on spirometry with no response to  bronchodilators.    ASSESSMENT/PLAN:   - Hypertension: Is at BP target of 140/90.  - Lipids: on high intensity statin  - Non-formulary or medications with cost-effective alternatives  ?? Denies cost concerns  ?? Asking for 90-day rx (to save on copays) of spironolactone to [facility] (mail order) Pharmacy  - Medication Interactions: The following clinically significant interactions were identified via Lexicomp Interaction Analysis as category D or higher:    ?? Cetirizine anticholinergic effects can increase ulcerogenic potential of potassium chloride - has been on both medications - monitor   ?? Cetirizine, oxycodone-APAP, alprazolam, buspirone - CNS depression - has been on medications - monitor   ?? Spironolactone and potassium chloride - increased change of hyperkalemia, spironolactone restarted - monitor (PCP appt next week)  ?? Buspirone, venlafaxine, trazodone - increase serotonergic effects - has been on medications, but recently increased trazodone - monitor   - Other Clinical Pharmacy Opportunities Assessed:  ?? Advised metoprolol should be 50mg  (1/2 tab) BID per most recent hospital discharge instructions

## 2016-07-22 MED ORDER — SPIRONOLACTONE 25 MG PO TABS
25 | ORAL_TABLET | Freq: Every day | ORAL | 0 refills | Status: DC
Start: 2016-07-22 — End: 2016-09-28

## 2016-07-22 NOTE — Telephone Encounter (Signed)
Spoke to patient; advised of below - no 90-day rx for spironolactone yet.   Realized I overlooked documenting below that patient is interested in influenza vaccine and PPSV23 - discussed with her; she will ask at next weeks appt/s.    Charlesetta Garibaldi Esmay Amspacher, PharmD, [place]  Direct: [phone]  Department, toll free: [phone], option 7

## 2016-07-22 NOTE — Telephone Encounter (Signed)
This is new med for patient. Need to monitor potassium level. If she tolerates it without issue and follows up with labs will consider 90 day supply.

## 2016-07-23 MED ORDER — FLUOCINONIDE 0.05 % EX SOLN
0.05 | CUTANEOUS | 1 refills | Status: DC
Start: 2016-07-23 — End: 2017-02-02

## 2016-07-23 NOTE — Telephone Encounter (Signed)
Please send in to [facility]

## 2016-07-23 NOTE — Telephone Encounter (Signed)
Rx sent.

## 2016-07-23 NOTE — Telephone Encounter (Signed)
Patient called in regards to a having a refill of Fluocinonide sent to [facility]. Patient says pharmacy says that she would have to call us in order to have Rx switched to another pharmacy. Thank You

## 2016-07-27 ENCOUNTER — Ambulatory Visit
Admit: 2016-07-27 | Discharge: 2016-07-27 | Payer: PRIVATE HEALTH INSURANCE | Attending: Family Medicine | Primary: Sports Medicine

## 2016-07-27 DIAGNOSIS — I10 Essential (primary) hypertension: Secondary | ICD-10-CM

## 2016-07-27 MED ORDER — VENLAFAXINE HCL ER 150 MG PO CP24
150 | ORAL_CAPSULE | Freq: Every day | ORAL | 3 refills | Status: DC
Start: 2016-07-27 — End: 2017-01-28

## 2016-07-27 MED ORDER — FUROSEMIDE 40 MG PO TABS
40 | ORAL_TABLET | ORAL | 3 refills | Status: DC
Start: 2016-07-27 — End: 2016-10-08

## 2016-07-27 MED ORDER — VENLAFAXINE HCL ER 75 MG PO CP24
75 MG | ORAL_CAPSULE | Freq: Every day | ORAL | 3 refills | Status: DC
Start: 2016-07-27 — End: 2017-01-27

## 2016-07-27 NOTE — Patient Instructions (Signed)
BP well-controlled at home.  Readings scanned to chart.  Bradycardia resolved with decreased dose of metoprolol.  Follow up as scheduled with Dr. Jacqualyn Posey.  Hold Lipitor for 2 weeks to see if this improved leg pain.  Call office 2 weeks with update.  Increase Effexor to 225mg  daily.  Refilled lasix to reflect dose at hospital discharge.    Check bmp.

## 2016-07-27 NOTE — Progress Notes (Signed)
Patient is here for a hospital follow-up from [facility] for three days for congestive heart failure.  Patient states that she is struggling to breath.  Patient is currently using an inhaler that provides no relief.

## 2016-07-27 NOTE — Progress Notes (Addendum)
Subjective:      Patient ID: Rachael Mays is a 60 y.o. female.    HPI    Patient presents for hospital follow up after admission from 07/15/2016-07/17/2016 at [facility] with chest pain.  She underwent a left heart catheterization which shoed patent LAD stent.  Lopressor was cut back during stay due to bradycardia.  Aldactone was added.  KCl replacement was decreased.      Since discharge, monitoring home bp and pulse.  Feels chronically fatigued and legs ache.  No cp or sob.     Asking for increased dose of effexor.  Mood is slightly depressed.  Recently, all adult kids and grandson are out of the house. Feels lonely at times as husband works second shift.       Review of Systems    Patient Active Problem List   Diagnosis   ??? Back pain   ??? Deep vein thrombosis ([place])   ??? Depression   ??? Hyperlipidemia   ??? Pulmonary embolism ([place])   ??? Nausea & vomiting   ??? Abdominal  pain, other specified site   ??? Morbid obesity due to excess calories ([place])   ??? Status post gastric banding   ??? Vertigo   ??? Dizziness   ??? Tinnitus, subjective   ??? Other complications of gastric band procedure   ??? Essential hypertension   ??? Hyperglycemia   ??? Atypical chest pain   ??? 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 Adrenal mass, left ([place])   ??? Adrenal nodule ([place])   ??? Obstructive sleep apnea (adult) (pediatric)   ??? Dyspnea and respiratory abnormalities   ??? Abnormal stress test   ??? Shortness of breath   ??? Chronic diastolic congestive heart failure ([place])   ??? Coronary artery disease due to lipid rich plaque   ??? Pulmonary hypertension ([place])   ??? 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 Chest pain   ??? Acute actinic otitis externa of both ears   ??? Bilateral acute serous otitis media   ??? Anxiety and depression   ??? Bradycardia       Outpatient Prescriptions Marked as Taking for the 07/27/16 encounter (Office Visit) with Harvel Quale, MD   Medication Sig Dispense Refill   ??? fluocinonide (LIDEX) 0.05 %  external solution Apply sparingly to the back of the scalp daily if needed for itching. 60 mL 1   ??? spironolactone (ALDACTONE) 25 MG tablet Take 1 tablet by mouth daily 30 tablet 0   ??? metoprolol (LOPRESSOR) 100 MG tablet Take 0.5 tablets by mouth 2 times daily 270 tablet 1   ??? potassium chloride (KLOR-CON M) 20 MEQ extended release tablet Take 1 tablet by mouth daily 120 tablet 3   ??? ALPRAZolam (XANAX) 0.25 MG tablet Take 1 tablet by mouth 2 times daily as needed for Anxiety 60 tablet 0   ??? venlafaxine (EFFEXOR XR) 150 MG extended release capsule TAKE ONE CAPSULE BY MOUTH ONE TIME A DAY 90 capsule 1   ??? furosemide (LASIX) 40 MG tablet Take 80 mg in am and 40 mg in pm 180 tablet 3   ??? nystatin (MYCOSTATIN) 100000 UNIT/GM cream Apply topically 2 times daily. 30 g 2   ??? atorvastatin (LIPITOR) 80 MG tablet Take 1 tablet by mouth daily 90 tablet 3   ??? lisinopril (PRINIVIL;ZESTRIL) 40 MG tablet Take 1 tablet by mouth daily Indications: Patient thought this was dc'd in December by  PCP (Patient taking differently: Take 40 mg by mouth daily ) 90 tablet 3   ??? omeprazole (PRILOSEC) 40 MG delayed release capsule Take 1 capsule by mouth daily 90 capsule 3   ??? busPIRone (BUSPAR) 10 MG tablet Take 1 tablet by mouth 3 times daily 270 tablet 3   ??? oxyCODONE-acetaminophen (PERCOCET) 5-325 MG per tablet Take 1 tablet by mouth every 6 hours as needed for Pain . 40 tablet 0   ??? aspirin 81 MG EC tablet Take 1 tablet by mouth daily 30 tablet 11   ??? prasugrel (EFFIENT) 10 MG TABS Take 1 tablet by mouth daily 90 tablet 3   ??? triamcinolone (KENALOG) 0.1 % cream Apply to affected areas twice daily for up to 2 weeks or until improved. 80 g 1   ??? montelukast (SINGULAIR) 10 MG tablet TAKE ONE TABLET BY MOUTH ONE TIME A DAY 90 tablet 1   ??? traZODone (DESYREL) 100 MG tablet TAKE 1 TABLET BY MOUTH NIGHTLY 90 tablet 1   ??? Blood Pressure Monitor KIT Use daily as needed 1 kit 0   ??? ondansetron (ZOFRAN) 4 MG tablet Take 1 tablet by mouth daily as  needed for Nausea or Vomiting 30 tablet 1   ??? Multiple Vitamins-Minerals (MULTIVITAMIN PO) Take 1 tablet by mouth daily      ??? CALCIUM-VITAMIN D PO Take 1 tablet by mouth daily      ??? cetirizine (ZYRTEC ALLERGY) 10 MG tablet Take 10 mg by mouth daily.         Allergies   Allergen Reactions   ??? Morphine Itching   ??? Talwin [Pentazocine] Other (See Comments)     dizzy       Social History   Substance Use Topics   ??? Smoking status: Former Smoker     Packs/day: 0.50     Years: 40.00     Types: Cigarettes     Quit date: 08/07/2012   ??? Smokeless tobacco: Former Systems developer     Quit date: 06/16/2013      Comment: started to smoke at age 37 / only smoked 0.5 p.p.d    ??? Alcohol use 0.0 oz/week     0 Standard drinks or equivalent per week      Comment: occasionally        Objective:   BP (!) 140/80 (Site: Right Arm, Position: Sitting, Cuff Size: Large Adult)   Pulse 78   Wt (!) 301 lb 9.6 oz (136.8 kg)   SpO2 96%   BMI 48.68 kg/m2    Physical Exam   Constitutional: She is oriented to person, place, and time. She appears well-developed and well-nourished. No distress.   Cardiovascular: Normal rate, regular rhythm and normal heart sounds.    No murmur heard.  Pulmonary/Chest: Effort normal and breath sounds normal. She has no wheezes. She has no rales.   Neurological: She is alert and oriented to person, place, and time.   Psychiatric: She has a normal mood and affect. Her behavior is normal.       Assessment:     1. Essential hypertension     2. Status post insertion of drug-eluting stent into left anterior descending (LAD) artery     3. Hyperlipidemia, unspecified hyperlipidemia type     4. Depression, unspecified depression type  venlafaxine (EFFEXOR XR) 150 MG extended release capsule    venlafaxine (EFFEXOR XR) 75 MG extended release capsule   5. Chronic diastolic congestive heart failure (HCC)  furosemide (LASIX) 40  MG tablet     Plan:     BP well-controlled at home.  Readings scanned to chart.  Bradycardia resolved with decreased  dose of metoprolol.  Follow up as scheduled with Dr. Jacqualyn Posey.  Hold Lipitor for 2 weeks to see if this improved leg pain.  Call office 2 weeks with update.  Increase Effexor to 268m daily.  Refilled lasix to reflect dose at hospital discharge.    Check bmp.        Initial post-discharge communication occurred between nurse care coordinator and patient on 07/18/2016 and 07/20/2016--see documentation in chart: telephone encounter.    Diagnostic test results reviewed: inpatient labs, chest x-ray and CT angiogram--07/17/2016    Patient risk of morbidity and mortality: moderate    Medical Decision Making: moderate complexity

## 2016-07-28 ENCOUNTER — Encounter: Attending: Critical Care Medicine | Primary: Sports Medicine

## 2016-07-28 NOTE — Telephone Encounter (Signed)
CLINICAL PHARMACY CONSULT: MED RECONCILIATION/REVIEW ADDENDUM    For Pharmacy Admin Tracking Only    PHSO: Yes  Total # of Interventions Recommended: 3  - Increased Dose #: 1  - Refills Provided #: 1  Total Interventions Accepted: 2  Time Spent (min): 35

## 2016-08-02 ENCOUNTER — Encounter

## 2016-08-03 MED ORDER — TRAZODONE HCL 100 MG PO TABS
100 MG | ORAL_TABLET | ORAL | 1 refills | Status: DC
Start: 2016-08-03 — End: 2017-01-28

## 2016-08-06 ENCOUNTER — Inpatient Hospital Stay: Attending: Cardiovascular Disease | Primary: Sports Medicine

## 2016-08-06 DIAGNOSIS — R0602 Shortness of breath: Secondary | ICD-10-CM

## 2016-08-06 NOTE — Telephone Encounter (Signed)
Having problem breathing today . Her feet and hands are swelling . She is taking lasix 80mg  QAM and 6pm takes 40mg  . Please call to let her know if she should increase her lasix

## 2016-08-06 NOTE — Telephone Encounter (Signed)
venlafaxine (EFFEXOR XR) 150 MG extended release capsule 90 capsule 3 07/27/2016     ?? Sig - Route: Take 1 capsule by mouth daily - Oral       Pt received the above and didn't think she was supposed to receive this as pt was prescribed 75 mg as a new medication        venlafaxine (EFFEXOR XR) 75 MG extended release capsule 90 capsule 3 07/27/2016     ?? Sig - Route: Take 2 capsules by mouth daily - Oral     Pt did not receive the above but will double check with the pharmarcy 9/1 and call back 9/1 to verify whether she received this medication

## 2016-08-06 NOTE — Telephone Encounter (Signed)
She can increase to 80 mg twice a day, but needs to be seen in the office next week as scheduled.  LEW

## 2016-08-07 LAB — BASIC METABOLIC PANEL
Anion Gap: 15 (ref 3–16)
BUN: 14 mg/dL (ref 7–20)
CO2: 26 mmol/L (ref 21–32)
Calcium: 10.2 mg/dL (ref 8.3–10.6)
Chloride: 105 mmol/L (ref 99–110)
Creatinine: 0.9 mg/dL (ref 0.6–1.1)
GFR African American: >60 (ref >60)
GFR Non-African American: >60 (ref >60)
Glucose: 116 mg/dL — ABNORMAL HIGH (ref 70–99)
Potassium: 5.2 mmol/L — ABNORMAL HIGH (ref 3.5–5.1)
Sodium: 146 mmol/L — ABNORMAL HIGH (ref 136–145)

## 2016-08-07 LAB — BRAIN NATRIURETIC PEPTIDE: Pro-BNP: 103 pg/mL (ref 0–124)

## 2016-08-07 NOTE — Telephone Encounter (Signed)
lmor as per LEW. To call if any questions.

## 2016-08-13 ENCOUNTER — Ambulatory Visit
Admit: 2016-08-13 | Discharge: 2016-08-13 | Payer: PRIVATE HEALTH INSURANCE | Attending: Acute Care | Primary: Sports Medicine

## 2016-08-13 DIAGNOSIS — I5032 Chronic diastolic (congestive) heart failure: Secondary | ICD-10-CM

## 2016-08-13 NOTE — Progress Notes (Signed)
[place] - Cardiology      Chief Complaint: "shortness of breath"    Chief Complaint   Patient presents with   ??? 1 Month Follow-Up     (PHTN)    ??? Coronary Artery Disease   ??? Hypertension   ??? Hyperlipidemia   ??? Chest Pain     aching in left chest    ??? Shoulder Pain     left side from shoulder at times   ??? Shortness of Breath     always seems to be sob; the more she does the worse it gets   ??? Edema     legs and hands   ??? Fatigue   ??? Dizziness     once up and moving around, a couple times a day       History of present illness: Rachael Mays is a 60 y.o. 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 She has been experiencing chronic chest pain and shortness of breath since April 2017. She underwent LHC (03/2016) that revealed significant stenosis of the LAD and a DES was placed. RHC was also performed which showed PA mean 33 mmHg, PCWP 18 mmHg (transpulmonary gradient 14), PVR 2.134 Wood units.  Symptoms have not improved post stent. She has been seen by Dr Jeb Levering and CTA/CTPA (03/2016) with no evidence of PE or acute abnormality. Echo 04/06/16 with concentric LVH, EF 65% and RVSP 42 mmHg. She was hospitalized 8/9-8/11 after presenting with shortness of breath and chest pain. Repeat LHC demonstrated patent stent LAD. Lopressor was decreased secondary bradycardia and aldactone added.       Patient presents today for follow up dCHF. She phoned the office 8/31 to report increased swelling and shortness of breath. Lasix was increased to 80 mg BID (from 80/40). She continues to experience shortness of breath with minimal activity; she was short of breath walking into office today. She is frustrated because she used to walk 3 miles a day and now gets winded walking from room to room in her home. She has constant chest discomfort/pressure described as a sensation of "drowning." Denies orthopnea or PND but  on bipap at night (since 12/2015).        Rachael Mays describes symptoms including chest discomfort, dyspnea, fatigue, edema but denies orthopnea, PND, syncope. Patient reports adherence with medication regimen. She follows a no added salt diet and estimates she drinks about 48 ounces a day. Weight appears stable.        Past Medical History:   Diagnosis Date   ??? Anxiety    ??? Asthma    ??? Blood circulation, collateral    ??? CAD (coronary artery disease)    ??? CHF (congestive heart failure) ([place])    ??? Chronic back pain    ??? Deep vein thrombosis    ??? Depression    ??? GERD (gastroesophageal reflux disease)     NO LONGER SINCE LAP   ??? GERD (gastroesophageal reflux disease) 01/23/2009   ??? Hx of blood clots    ??? Hyperlipidemia     hx; resolved with lap band   ??? Hypertension    ??? Obesity     hx of; had lap band   ??? Obstructive sleep apnea (adult) (pediatric) 09/18/2015   ??? Pulmonary embolism [facility])      Past Surgical History:   Procedure Laterality Date   ??? BACK SURGERY  neck  plates   ??? BREAST SURGERY      left lumpectomy   ??? CESAREAN SECTION     ??? CHOLECYSTECTOMY     ??? COLONOSCOPY     ??? COLONOSCOPY  04/08/10   ??? CORONARY ANGIOPLASTY     ??? DIAGNOSTIC CARDIAC CATH LAB PROCEDURE     ??? DILATATION, ESOPHAGUS     ??? ENDOSCOPY, COLON, DIAGNOSTIC     ??? HYSTERECTOMY     ??? LAP BAND  05/01/08    Dr. Nicole Cella   ??? OTHER SURGICAL HISTORY      Greenfield Filter: curently in place   ??? OTHER SURGICAL HISTORY  07/05/13    lap band removal   ??? PARTIAL HYSTERECTOMY     ??? SHOULDER SURGERY     ??? UPPER GASTROINTESTINAL ENDOSCOPY  05/19/13   ??? UPPER GASTROINTESTINAL ENDOSCOPY  07/21/13    ESOPHAGEAL STENT PLACEMENT   ??? UPPER GASTROINTESTINAL ENDOSCOPY N/A 07/31/2014    Esophagogastroduodenoscopy with esophageal balloon dilation   ??? VASCULAR SURGERY  04/2015    Lucendia Herrlich, celiac artery angiogram, normal abd arteries     Social History   Substance Use Topics   ??? Smoking status: Former Smoker     Packs/day: 0.50     Years: 40.00     Types: Cigarettes     Quit  date: 08/07/2012   ??? Smokeless tobacco: Former Systems developer     Quit date: 06/16/2013      Comment: started to smoke at age 73 / only smoked 0.5 p.p.d    ??? Alcohol use No     Review of Systems:   Constitutional: No significant change in weight, + fatigue   HEENT: No change in vision or ringing in the ears.  Respiratory: + DOE, no PND, orthopnea or cough.   Cardiovascular: See HPI + chest pressure, + edema (hands/feet)  GI: No n/v, abdominal pain or changes in bowel habits.  No melena, no hematochezia  GU: No dysuria or hematuria.  Skin: No rash or new skin lesions.  Musculoskeletal: No new muscle or joint pain.  Neurological: No lightheadedness, dizziness, syncope or TIA-like symptoms.  Psychiatric: No anxiety, insomnia or depression    Physical Exam:  BP 110/64 (Site: Left Arm, Position: Sitting, Cuff Size: Large Adult)   Pulse 64   Resp 15   Ht '5\' 6"'  (1.676 m)   Wt (!) 301 lb 1.9 oz (136.6 kg)   SpO2 95%   BMI 48.6 kg/m2    General:  Awake, alert, oriented in NAD  Skin:  Warm and dry.  No unusual bruising or rash  HEENT: Normocephalic and atraumatic. Oral mucosa moist, no cyanosis.  Neck:  Supple.  No JVP appreciated but difficult to assess  Chest:  Normal effort.  Clear to auscultation  Cardiovascular:  RRR, S1/S2, no murmur/gallop/rub  Abdomen:  Soft, nontender, +bowel sounds  Extremities:  1+ BLE edema mostly involving feet and ankles  Neurological: No focal deficits  Psychological: Normal mood and affect      Current Outpatient Prescriptions   Medication Sig Dispense Refill   ??? traZODone (DESYREL) 100 MG tablet TAKE ONE TABLET BY MOUTH NIGHTLY 90 tablet 1   ??? furosemide (LASIX) 40 MG tablet Take 80 mg in am and 40 mg in pm (Patient taking differently: 80 mg 2 times daily Take 80 mg in am and 40 mg in pm) 270 tablet 3   ??? venlafaxine (EFFEXOR XR) 150 MG extended release capsule Take 1 capsule by mouth  daily 90 capsule 3   ??? venlafaxine (EFFEXOR XR) 75 MG extended release capsule Take 2 capsules by mouth daily 90 capsule 3    ??? fluocinonide (LIDEX) 0.05 % external solution Apply sparingly to the back of the scalp daily if needed for itching. 60 mL 1   ??? spironolactone (ALDACTONE) 25 MG tablet Take 1 tablet by mouth daily 30 tablet 0   ??? metoprolol (LOPRESSOR) 100 MG tablet Take 0.5 tablets by mouth 2 times daily 270 tablet 1   ??? potassium chloride (KLOR-CON M) 20 MEQ extended release tablet Take 1 tablet by mouth daily 120 tablet 3   ??? ALPRAZolam (XANAX) 0.25 MG tablet Take 1 tablet by mouth 2 times daily as needed for Anxiety 60 tablet 0   ??? nystatin (MYCOSTATIN) 100000 UNIT/GM cream Apply topically 2 times daily. 30 g 2   ??? lisinopril (PRINIVIL;ZESTRIL) 40 MG tablet Take 1 tablet by mouth daily Indications: Patient thought this was dc'd in December by PCP (Patient taking differently: Take 40 mg by mouth daily ) 90 tablet 3   ??? omeprazole (PRILOSEC) 40 MG delayed release capsule Take 1 capsule by mouth daily 90 capsule 3   ??? busPIRone (BUSPAR) 10 MG tablet Take 1 tablet by mouth 3 times daily 270 tablet 3   ??? oxyCODONE-acetaminophen (PERCOCET) 5-325 MG per tablet Take 1 tablet by mouth every 6 hours as needed for Pain . 40 tablet 0   ??? nitroGLYCERIN (NITROSTAT) 0.4 MG SL tablet up to max of 3 total doses. If no relief after 1 dose, call 911. 25 tablet 3   ??? aspirin 81 MG EC tablet Take 1 tablet by mouth daily 30 tablet 11   ??? prasugrel (EFFIENT) 10 MG TABS Take 1 tablet by mouth daily 90 tablet 3   ??? triamcinolone (KENALOG) 0.1 % cream Apply to affected areas twice daily for up to 2 weeks or until improved. 80 g 1   ??? montelukast (SINGULAIR) 10 MG tablet TAKE ONE TABLET BY MOUTH ONE TIME A DAY 90 tablet 1   ??? albuterol sulfate HFA (PROVENTIL HFA) 108 (90 BASE) MCG/ACT inhaler Inhale 2 puffs into the lungs every 6 hours as needed for Wheezing 3 Inhaler 3   ??? Blood Pressure Monitor KIT Use daily as needed 1 kit 0   ??? ondansetron (ZOFRAN) 4 MG tablet Take 1 tablet by mouth daily as needed for Nausea or Vomiting 30 tablet 1   ??? Multiple  Vitamins-Minerals (MULTIVITAMIN PO) Take 1 tablet by mouth daily      ??? CALCIUM-VITAMIN D PO Take 1 tablet by mouth daily      ??? cetirizine (ZYRTEC ALLERGY) 10 MG tablet Take 10 mg by mouth daily.       No current facility-administered medications for this visit.        Labs:   Lab Results   Component Value Date    WBC 8.6 07/15/2016    HGB 13.7 07/15/2016    HCT 42.6 07/15/2016    MCV 85.7 07/15/2016    PLT 170 07/15/2016     Lab Results   Component Value Date    NA 146 08/06/2016    K 5.2 08/06/2016    CL 105 08/06/2016    CO2 26 08/06/2016    BUN 14 08/06/2016    CREATININE 0.9 08/06/2016    GLUCOSE 116 08/06/2016    CALCIUM 10.2 08/06/2016      Lab Results   Component Value Date    TRIG  114 06/17/2016    HDL 43 06/17/2016    HDL 52 03/31/2011    LDLCALC 52 06/17/2016    LABVLDL 23 06/17/2016       Diagnostics:    Echo 04/06/16:  Summary   Normal left ventricle size and systolic function with an estimated ejection   fraction of 65%.   There is mild concentric left ventricular hypertrophy.   Mild mitral regurgitation is present.   The aortic valve appears sclerotic but opens well.   There is mild tricuspid regurgitation with RVSP estimated at 42 mmHg.   Mild pulmonic regurgitation present.   Definity was used to better delineate endocardium.    Cardiac Cath 07/17/16:  Anatomy:   LM-normal  LAD-widely patent stent otherwise normal  Cx-normal, dominant  OM1- normal  RCA-normal  LVEF- 70%, LVEDP 15 mmHg  ??  Impression:  1.  Widely patent LAD stent otherwise normal coronary arteries  2.  Hyperdynamic LV systolic function  3.  Non-cardiac chest pain     R/L Cardiac Cath 03/31/16:  Anatomy:   LM-normal   LAD-70% mid with FFR 0.77  Cx-normal, codominant  OM1- normal  RCA-normal  RPDA- normal  LVEF- 70%, LVEDP 19   PCI: LAD 70% to 0% with 3.25 mm x 18 mm Xience Alpine drug-eluting stent     Hemodynamics:  RA-15/12 mean 9  RV- 60/0, 9  PAWP-20/26 mean 19  PA- 60/12 mean 33     Assessment:  1. Chronic dCHF. Appears mildly  hypervolemic considering edema.     2.  Shortness of breath. Likely multifactorial: dCHF, COPD, obesity related hypoventilation and deconditioning.   3. Essential hypertension. Controlled.    4. CAD s/p DES 03/2016. Stable. On DAPT, statin and beta blocker therapy.       Plan:  1. Discontinue potassium pill secondary hyperkalemia (5.2).   2. Continue Lasix 80 mg twice a day until weight at baseline.  3. Recheck labs in one week (approximately 9/11).  4. Anticipate increase in aldactone (spironolactone) if kidney and potassium level stable.   5. Follow up in 4 weeks, earlier for worsening.      Thank you for allowing me to participate in the care of your patient.    Jacqualine Code, CNP        QUALITY MEASURES  1. Tobacco Cessation Counseling: N/A  2. BP retake if >140/90: yes  3. Communication to PCP: Office note forwarded/faxed to PCP  4. CAD antiplatelet therapy: yes  5. CAD lipid lowering therapy: yes  6. HF A. Fib on anticoagulation: N/A

## 2016-08-13 NOTE — Progress Notes (Deleted)
[facility] - Cardiology      Chief Complaint:     Chief Complaint   Patient presents with   ??? 1 Month Follow-Up     (PHTN)    ??? Coronary Artery Disease   ??? Hypertension   ??? Hyperlipidemia   ??? Chest Pain     aching in left chest    ??? Shoulder Pain     left side from shoulder at times   ??? Shortness of Breath     always seems to be sob; the more she does the worse it gets   ??? Edema     legs and hands   ??? Fatigue   ??? Dizziness     once up and moving around, a couple times a day       History of present illness: Rachael Mays is a 60 y.o. female with past medical history significant for CAD/PCI (03/2016), chronic diastolic HF, chronic chest pain, HTN and OSA. She was hospitalized 8/9-8/11 after presenting with shortness of breath and chest pain. LHC (07/17/2016) demonstrated patent stent LAD. Patient phoned office 8/31 with increased swelling 80 mg BID (was 80 mg/40 mg).      Patient presents today for hospital follow up accompanied by      [name] describes symptoms including {CARDIAC PNTIRWER:[id number]} but denies {CARDIAC PPJKDTOI:[id number]}. Patient reports adherence with medication regimen and sodium/fluid restrictions. Weight on DC  Lbs, home weight today        Past Medical History:   Diagnosis Date   ??? Anxiety    ??? Asthma    ??? Blood circulation, collateral    ??? CAD (coronary artery disease)    ??? CHF (congestive heart failure) ([place])    ??? Chronic back pain    ??? Deep vein thrombosis    ??? Depression    ??? GERD (gastroesophageal reflux disease)     NO LONGER SINCE LAP   ??? GERD (gastroesophageal reflux disease) 01/23/2009   ??? Hx of blood clots    ??? Hyperlipidemia     hx; resolved with lap band   ??? Hypertension    ??? Obesity     hx of; had lap band   ??? Obstructive sleep apnea (adult) (pediatric) 09/18/2015   ??? Pulmonary embolism ([place])         has a past surgical history that includes Colonoscopy; Cesarean section; Cholecystectomy; Hysterectomy; shoulder surgery; Lap Band  (05/01/08); back surgery; Colonoscopy (04/08/10); other surgical history; Upper gastrointestinal endoscopy (05/19/13); other surgical history (07/05/13); Upper gastrointestinal endoscopy (07/21/13); partial hysterectomy (cervix not removed); Upper gastrointestinal endoscopy (N/A, 07/31/2014); vascular surgery (04/2015); Diagnostic Cardiac Cath Lab Procedure; Coronary angioplasty; Endoscopy, colon, diagnostic; Breast surgery; and Dilatation, esophagus.    Social History     Social History   ??? Marital status: Married     Spouse name: [name]   ??? Number of children: 2   ??? Years of education: 12     Occupational History   ??? Not on file.     Social History Main Topics   ??? Smoking status: Former Smoker     Packs/day: 0.50     Years: 40.00     Types: Cigarettes     Quit date: 08/07/2012   ??? Smokeless tobacco: Former Systems developer     Quit date: 06/16/2013      Comment: started to smoke at age 49 / only smoked 0.5 p.p.d    ??? Alcohol use No   ???  Drug use: No   ??? Sexual activity: No     Other Topics Concern   ??? Not on file     Social History Narrative    ** Merged History Encounter **            Review of Systems:   Constitutional: No significant change in weight, fatigue or weakness.  HEENT: No change in vision or ringing in the ears.  Respiratory: No DOE, PND, orthopnea or cough.   Cardiovascular: See HPI  GI: No n/v, abdominal pain or changes in bowel habits.  No melena, no hematochezia  GU: No dysuria or hematuria.  Skin: No rash or new skin lesions.  Musculoskeletal: No new muscle or joint pain.  Neurological: No lightheadedness, dizziness, syncope or TIA-like symptoms.  Psychiatric: No anxiety, insomnia or depression    Physical Exam:  BP 110/64 (Site: Left Arm, Position: Sitting, Cuff Size: Large Adult)   Pulse 64   Resp 15   Ht '5\' 6"'  (1.676 m)   Wt (!) 301 lb 1.9 oz (136.6 kg)   SpO2 95%   BMI 48.6 kg/m2    General:  Awake, alert, oriented in NAD  Skin:  Warm and dry.  No unusual bruising or rash  HEENT: Normocephalic and atraumatic. Oral  mucosa moist, no cyanosis.  Neck:  Supple.  No JVP or carotid bruit appreciated  Chest:  Normal effort.  Clear to auscultation, no wheezes/rhonchi/rales  Cardiovascular:  RRR, S1/S2, no murmur/gallop/rub  Abdomen:  Soft, nontender, +bowel sounds  Extremities:  No edema  Neurological: No focal deficits  Psychological: Normal mood and affect      Current Outpatient Prescriptions   Medication Sig Dispense Refill   ??? traZODone (DESYREL) 100 MG tablet TAKE ONE TABLET BY MOUTH NIGHTLY 90 tablet 1   ??? furosemide (LASIX) 40 MG tablet Take 80 mg in am and 40 mg in pm (Patient taking differently: 80 mg 2 times daily Take 80 mg in am and 40 mg in pm) 270 tablet 3   ??? venlafaxine (EFFEXOR XR) 150 MG extended release capsule Take 1 capsule by mouth daily 90 capsule 3   ??? venlafaxine (EFFEXOR XR) 75 MG extended release capsule Take 2 capsules by mouth daily 90 capsule 3   ??? fluocinonide (LIDEX) 0.05 % external solution Apply sparingly to the back of the scalp daily if needed for itching. 60 mL 1   ??? spironolactone (ALDACTONE) 25 MG tablet Take 1 tablet by mouth daily 30 tablet 0   ??? metoprolol (LOPRESSOR) 100 MG tablet Take 0.5 tablets by mouth 2 times daily 270 tablet 1   ??? potassium chloride (KLOR-CON M) 20 MEQ extended release tablet Take 1 tablet by mouth daily 120 tablet 3   ??? ALPRAZolam (XANAX) 0.25 MG tablet Take 1 tablet by mouth 2 times daily as needed for Anxiety 60 tablet 0   ??? nystatin (MYCOSTATIN) 100000 UNIT/GM cream Apply topically 2 times daily. 30 g 2   ??? lisinopril (PRINIVIL;ZESTRIL) 40 MG tablet Take 1 tablet by mouth daily Indications: Patient thought this was dc'd in December by PCP (Patient taking differently: Take 40 mg by mouth daily ) 90 tablet 3   ??? omeprazole (PRILOSEC) 40 MG delayed release capsule Take 1 capsule by mouth daily 90 capsule 3   ??? busPIRone (BUSPAR) 10 MG tablet Take 1 tablet by mouth 3 times daily 270 tablet 3   ??? oxyCODONE-acetaminophen (PERCOCET) 5-325 MG per tablet Take 1 tablet by  mouth every 6 hours  as needed for Pain . 40 tablet 0   ??? nitroGLYCERIN (NITROSTAT) 0.4 MG SL tablet up to max of 3 total doses. If no relief after 1 dose, call 911. 25 tablet 3   ??? aspirin 81 MG EC tablet Take 1 tablet by mouth daily 30 tablet 11   ??? prasugrel (EFFIENT) 10 MG TABS Take 1 tablet by mouth daily 90 tablet 3   ??? triamcinolone (KENALOG) 0.1 % cream Apply to affected areas twice daily for up to 2 weeks or until improved. 80 g 1   ??? montelukast (SINGULAIR) 10 MG tablet TAKE ONE TABLET BY MOUTH ONE TIME A DAY 90 tablet 1   ??? albuterol sulfate HFA (PROVENTIL HFA) 108 (90 BASE) MCG/ACT inhaler Inhale 2 puffs into the lungs every 6 hours as needed for Wheezing 3 Inhaler 3   ??? Blood Pressure Monitor KIT Use daily as needed 1 kit 0   ??? ondansetron (ZOFRAN) 4 MG tablet Take 1 tablet by mouth daily as needed for Nausea or Vomiting 30 tablet 1   ??? Multiple Vitamins-Minerals (MULTIVITAMIN PO) Take 1 tablet by mouth daily      ??? CALCIUM-VITAMIN D PO Take 1 tablet by mouth daily      ??? cetirizine (ZYRTEC ALLERGY) 10 MG tablet Take 10 mg by mouth daily.       No current facility-administered medications for this visit.        Labs:   Lab Results   Component Value Date    WBC 8.6 07/15/2016    HGB 13.7 07/15/2016    HCT 42.6 07/15/2016    MCV 85.7 07/15/2016    PLT 170 07/15/2016     Lab Results   Component Value Date    NA 146 08/06/2016    K 5.2 08/06/2016    CL 105 08/06/2016    CO2 26 08/06/2016    BUN 14 08/06/2016    CREATININE 0.9 08/06/2016    GLUCOSE 116 08/06/2016    CALCIUM 10.2 08/06/2016      Lab Results   Component Value Date    TRIG 114 06/17/2016    HDL 43 06/17/2016    HDL 52 03/31/2011    LDLCALC 52 06/17/2016    LABVLDL 23 06/17/2016       Diagnostics:    Echo 04/06/16:  Summary   Normal left ventricle size and systolic function with an estimated ejection fraction of 65%.   There is mild concentric left ventricular hypertrophy.   Mild mitral regurgitation is present.   The aortic valve appears sclerotic  but opens well.   There is mild tricuspid regurgitation with RVSP estimated at 42 mmHg.   Mild pulmonic regurgitation present.   Definity was used to better delineate endocardium.     Cardiac Cath 03/31/16:  Anatomy:   LM-normal   LAD-70% mid with FFR 0.77  Cx-normal, codominant  OM1- normal  RCA-normal  RPDA- normal  LVEF- 70%, LVEDP 19   PCI: LAD 70% to 0% with 3.25 mm x 18 mm Xience Alpine drug-eluting stent     Hemodynamics:  RA-15/12 mean 9  RV- 60/0, 9  PAWP-20/26 mean 19  PA- 60/12 mean 33     Cardiac Cath 07/17/16:  Anatomy:   LM-normal  LAD-widely patent stent otherwise normal  Cx-normal, dominant  OM1- normal  RCA-normal  LVEF- 70%, LVEDP 15 mmHg     Impression:  1.  Widely patent LAD stent otherwise normal coronary arteries  2.  Hyperdynamic LV systolic  function  3.  Non-cardiac chest pain      Assessment:      Plan:    earlier for worsening.    Thank you for allowing me to participate in the care of your patient.    Jacqualine Code, CNP        QUALITY MEASURES  1. Tobacco Cessation Counseling: N/A  2. BP retake if >140/90: N/A  3. Communication to PCP: Office note forwarded/faxed to PCP/[facility]W  4. CAD antiplatelet therapy: N/A  5. CAD lipid lowering therapy: N/A  6. HF A. Fib on anticoagulation: N/A

## 2016-08-13 NOTE — Patient Instructions (Signed)
1. Discontinue potassium pill (potassium level high on last labs).     2. Continue Lasix 80 mg twice a day until weight at baseline.    3. Recheck labs in one week (approximately 9/11).    4. Anticipate increase in aldactone (spironolactone) if kidney and potassium level stable.     5. Follow up in 4 weeks, earlier for worsening.

## 2016-08-17 ENCOUNTER — Inpatient Hospital Stay: Attending: Acute Care | Primary: Sports Medicine

## 2016-08-17 DIAGNOSIS — I5032 Chronic diastolic (congestive) heart failure: Secondary | ICD-10-CM

## 2016-08-18 ENCOUNTER — Encounter

## 2016-08-18 LAB — BASIC METABOLIC PANEL
Anion Gap: 14 (ref 3–16)
BUN: 15 mg/dL (ref 7–20)
CO2: 27 mmol/L (ref 21–32)
Calcium: 9.7 mg/dL (ref 8.3–10.6)
Chloride: 103 mmol/L (ref 99–110)
Creatinine: 0.9 mg/dL (ref 0.6–1.1)
GFR African American: >60 (ref >60)
GFR Non-African American: >60 (ref >60)
Glucose: 104 mg/dL — ABNORMAL HIGH (ref 70–99)
Potassium: 4.5 mmol/L (ref 3.5–5.1)
Sodium: 144 mmol/L (ref 136–145)

## 2016-08-19 ENCOUNTER — Encounter

## 2016-08-19 MED ORDER — METOLAZONE 2.5 MG PO TABS
2.5 | ORAL_TABLET | ORAL | 0 refills | Status: DC
Start: 2016-08-19 — End: 2016-08-19

## 2016-08-19 MED ORDER — METOLAZONE 2.5 MG PO TABS
2.5 | ORAL_TABLET | ORAL | 0 refills | Status: DC
Start: 2016-08-19 — End: 2016-09-02

## 2016-08-19 MED ORDER — METOLAZONE 2.5 MG PO TABS
2.5 MG | ORAL_TABLET | Freq: Every day | ORAL | 0 refills | Status: DC
Start: 2016-08-19 — End: 2016-08-19

## 2016-08-19 NOTE — Telephone Encounter (Signed)
Pt called to adv that she feels like she is drowning, she has sob with chest pain this has been going on  for a couple days.

## 2016-08-19 NOTE — Telephone Encounter (Signed)
??    Plan:  1. Discontinue potassium pill secondary hyperkalemia (5.2).   2. Continue Lasix 80 mg twice a day until weight at baseline.  3. Recheck labs in one week (approximately 9/11).  4. Anticipate increase in aldactone (spironolactone) if kidney and potassium level stable.   5. Follow up in 4 weeks, earlier for worsening.  ??

## 2016-08-19 NOTE — Telephone Encounter (Signed)
See if patient is willing to come to office today.

## 2016-08-19 NOTE — Telephone Encounter (Signed)
Spoke with patient. She reports worsening shortness of breath which began yesterday and has gradually been worsening. Says she feels as if she is smothering. Accompanied with chest discomfort. Says similar to previous HF episodes.      Patient with history of diastolic heart failure and CAD s/p stent 03/2016. She was hospitalized last month after presenting with shortness of breath and chest pain. LHC (07/17/2016) demonstrated patent stent. CTA pulmonary (06/16/2016) with no evidence of pulmonary embolism.     She is currently on Lasix 80 mg BID and spironolactone 25 mg daily. Labs 9/11 with stable renal function (BUN 15/creat 0.9).     Patient is unable to come to office today for evaluation. Will attempt  Metolazone 2.5 mg today. Instructed to take 30 minutes before next Lasix dose. I asked her to call with update in the morning. Patient expresses understanding.

## 2016-08-19 NOTE — Telephone Encounter (Signed)
Pt can't come in today. She has another apt at another office. She would like a call from [facility] to discuss other options.

## 2016-08-20 LAB — URINE DRUG SCREEN
Amphetamine Screen, Urine: NEGATIVE
Barbiturate Screen, Ur: NEGATIVE (ref <200)
Benzodiazepine Screen, Urine: NEGATIVE (ref <200)
Cannabinoid Scrn, Ur: NEGATIVE (ref <50)
Cocaine Metabolite Screen, Urine: NEGATIVE (ref <300)
Methadone Screen, Urine: NEGATIVE (ref <300)
Opiate Scrn, Ur: NEGATIVE (ref <300)
Oxycodone Urine: POSITIVE — AB (ref <100)
PCP Screen, Urine: NEGATIVE (ref <25)
Propoxyphene Scrn, Ur: NEGATIVE (ref <300)
pH, UA: 5

## 2016-08-20 NOTE — Telephone Encounter (Signed)
Spoke with patient; relayed information. Forwarded to scheduling line, it was busy. Patient will try calling back in a few minutes to schedule

## 2016-08-20 NOTE — Telephone Encounter (Signed)
Please review and advise

## 2016-08-20 NOTE — Telephone Encounter (Signed)
Per [facility] pt calling back with update:  Pt is still the same, SOB, did lose a good amount fluid.  Pt's weight 302 now 298 down 4# in 24 hours.  Pt still has a lot of swelling in her hands and calfs and by evening it is in both ankles.  Please call to adv.  Thank you CSF

## 2016-08-20 NOTE — Telephone Encounter (Signed)
I had prescribed two metolazone pills. She can repeat dose tomorrow. Remind her to take it thirty minutes before the furosemide. Is she available to be seen in office next week?

## 2016-08-27 NOTE — Telephone Encounter (Signed)
Error

## 2016-08-28 ENCOUNTER — Ambulatory Visit
Admit: 2016-08-28 | Discharge: 2016-08-28 | Payer: PRIVATE HEALTH INSURANCE | Attending: Acute Care | Primary: Sports Medicine

## 2016-08-28 DIAGNOSIS — I5032 Chronic diastolic (congestive) heart failure: Secondary | ICD-10-CM

## 2016-08-28 MED ORDER — HANDICAP PLACARD
0 refills | Status: DC
Start: 2016-08-28 — End: 2017-01-27

## 2016-08-28 NOTE — Progress Notes (Signed)
[facility] - Cardiology      Chief Complaint: "I have good days and bad"    Chief Complaint   Patient presents with   ??? Check-Up   ??? Coronary Artery Disease   ??? Hypertension   ??? Congestive Heart Failure   ??? Edema     c/o swelling in hands, stomach and ankles   ??? Chest Pain     off and on; unchanged   ??? Dizziness     off and on   ??? Fatigue     always tired   ??? Shortness of Breath     with any exertion       History of present illness: Rachael Mays is a 60 y.o. 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 She has been experiencing chronic chest pain and shortness of breath since April 2017. She underwent LHC (03/2016) that revealed significant stenosis of the LAD and a DES was placed. RHC was also performed which showed PA mean 33 mmHg, PCWP 18 mmHg (transpulmonary gradient 14), PVR 2.134 Wood units.  Symptoms have not improved post stent. She has been seen by Dr Jeb Levering and CTA/CTPA (03/2016) with no evidence of PE or acute abnormality. Echo 04/06/16 with concentric LVH, EF 65% and RVSP 42 mmHg. She was hospitalized 8/9-8/11 after presenting with shortness of breath and chest pain. Repeat LHC demonstrated patent stent LAD.        Patient presents today for follow up dCHF. She continues to experience chronic chest discomfort to varying degrees. She continues to experience shortness of breath with minimal activity, she was short of breath walking into office today. She is frustrated because she does not feel as if any progress is being made. Denies orthopnea or PND but on bipap at night (since 12/2015). No palpitations or lightheadedness.        Rachael Mays describes symptoms including chest discomfort, dyspnea, fatigue, edema but denies orthopnea, PND, syncope. Patient reports adherence with medication regimen. She follows a no added salt diet and estimates she drinks about 48 ounces a day. Weight  appears stable.        Past Medical History:   Diagnosis Date   ??? Anxiety    ??? Asthma    ??? Blood circulation, collateral    ??? CAD (coronary artery disease)    ??? CHF (congestive heart failure) ([place])    ??? Chronic back pain    ??? Deep vein thrombosis    ??? Depression    ??? GERD (gastroesophageal reflux disease)     NO LONGER SINCE LAP   ??? GERD (gastroesophageal reflux disease) 01/23/2009   ??? Hx of blood clots    ??? Hyperlipidemia     hx; resolved with lap band   ??? Hypertension    ??? Obesity     hx of; had lap band   ??? Obstructive sleep apnea (adult) (pediatric) 09/18/2015   ??? Pulmonary embolism [facility])      Past Surgical History:   Procedure Laterality Date   ??? BACK SURGERY      neck  plates   ??? BREAST SURGERY      left lumpectomy   ??? CESAREAN SECTION     ??? CHOLECYSTECTOMY     ??? COLONOSCOPY     ??? COLONOSCOPY  04/08/10   ??? CORONARY ANGIOPLASTY     ??? DIAGNOSTIC CARDIAC CATH LAB PROCEDURE     ???  DILATATION, ESOPHAGUS     ??? ENDOSCOPY, COLON, DIAGNOSTIC     ??? HYSTERECTOMY     ??? LAP BAND  05/01/08    Dr. Nicole Cella   ??? OTHER SURGICAL HISTORY      Greenfield Filter: curently in place   ??? OTHER SURGICAL HISTORY  07/05/13    lap band removal   ??? PARTIAL HYSTERECTOMY     ??? SHOULDER SURGERY     ??? UPPER GASTROINTESTINAL ENDOSCOPY  05/19/13   ??? UPPER GASTROINTESTINAL ENDOSCOPY  07/21/13    ESOPHAGEAL STENT PLACEMENT   ??? UPPER GASTROINTESTINAL ENDOSCOPY N/A 07/31/2014    Esophagogastroduodenoscopy with esophageal balloon dilation   ??? VASCULAR SURGERY  04/2015    Lucendia Herrlich, celiac artery angiogram, normal abd arteries     Social History   Substance Use Topics   ??? Smoking status: Former Smoker     Packs/day: 0.50     Years: 40.00     Types: Cigarettes     Quit date: 08/07/2012   ??? Smokeless tobacco: Former Systems developer     Quit date: 06/16/2013      Comment: started to smoke at age 20 / only smoked 0.5 p.p.d    ??? Alcohol use No     Review of Systems:   Constitutional: No significant change in weight, + fatigue   HEENT: No change in vision or ringing in the  ears.  Respiratory: + DOE, no PND, orthopnea or cough.   Cardiovascular: See HPI + chest pressure, + edema (hands/feet)  GI: No n/v, abdominal pain or changes in bowel habits.  No melena, no hematochezia  GU: No dysuria or hematuria.  Skin: No rash or new skin lesions.  Musculoskeletal: No new muscle or joint pain.  Neurological: No lightheadedness, dizziness, syncope or TIA-like symptoms.  Psychiatric: No anxiety, insomnia or depression    Physical Exam:  BP 122/64 (Site: Left Arm, Position: Sitting, Cuff Size: Medium Adult) Comment (Site): forearm BP taken   Pulse 68   Resp 16   Ht '5\' 6"'  (1.676 m)   Wt 300 lb (136.1 kg)   SpO2 96%   BMI 48.42 kg/m2    General:  Awake, alert, oriented in NAD  Skin:  Warm and dry.  No unusual bruising or rash  HEENT: Normocephalic and atraumatic. Oral mucosa moist, no cyanosis.  Neck:  Supple.  No JVP appreciated but difficult to assess  Chest:  Normal effort.  Clear to auscultation  Cardiovascular:  RRR, S1/S2, no murmur/gallop/rub  Abdomen:  Soft, nontender, +bowel sounds  Extremities:  1+ BLE edema mostly involving feet and ankles  Neurological: No focal deficits  Psychological: Normal mood and affect      Current Outpatient Prescriptions   Medication Sig Dispense Refill   ??? metolazone (ZAROXOLYN) 2.5 MG tablet Take 1 tablet by mouth every other day 2 tablet 0   ??? traZODone (DESYREL) 100 MG tablet TAKE ONE TABLET BY MOUTH NIGHTLY 90 tablet 1   ??? furosemide (LASIX) 40 MG tablet Take 80 mg in am and 40 mg in pm (Patient taking differently: 80 mg 2 times daily ) 270 tablet 3   ??? venlafaxine (EFFEXOR XR) 150 MG extended release capsule Take 1 capsule by mouth daily 90 capsule 3   ??? venlafaxine (EFFEXOR XR) 75 MG extended release capsule Take 2 capsules by mouth daily (Patient taking differently: Take 75 mg by mouth daily ) 90 capsule 3   ??? fluocinonide (LIDEX) 0.05 % external solution Apply sparingly to the back  of the scalp daily if needed for itching. 60 mL 1   ??? spironolactone  (ALDACTONE) 25 MG tablet Take 1 tablet by mouth daily 30 tablet 0   ??? metoprolol (LOPRESSOR) 100 MG tablet Take 0.5 tablets by mouth 2 times daily 270 tablet 1   ??? ALPRAZolam (XANAX) 0.25 MG tablet Take 1 tablet by mouth 2 times daily as needed for Anxiety 60 tablet 0   ??? nystatin (MYCOSTATIN) 100000 UNIT/GM cream Apply topically 2 times daily. 30 g 2   ??? lisinopril (PRINIVIL;ZESTRIL) 40 MG tablet Take 1 tablet by mouth daily Indications: Patient thought this was dc'd in December by PCP (Patient taking differently: Take 40 mg by mouth daily ) 90 tablet 3   ??? omeprazole (PRILOSEC) 40 MG delayed release capsule Take 1 capsule by mouth daily 90 capsule 3   ??? busPIRone (BUSPAR) 10 MG tablet Take 1 tablet by mouth 3 times daily 270 tablet 3   ??? oxyCODONE-acetaminophen (PERCOCET) 5-325 MG per tablet Take 1 tablet by mouth every 6 hours as needed for Pain . 40 tablet 0   ??? nitroGLYCERIN (NITROSTAT) 0.4 MG SL tablet up to max of 3 total doses. If no relief after 1 dose, call 911. 25 tablet 3   ??? aspirin 81 MG EC tablet Take 1 tablet by mouth daily 30 tablet 11   ??? prasugrel (EFFIENT) 10 MG TABS Take 1 tablet by mouth daily 90 tablet 3   ??? triamcinolone (KENALOG) 0.1 % cream Apply to affected areas twice daily for up to 2 weeks or until improved. 80 g 1   ??? montelukast (SINGULAIR) 10 MG tablet TAKE ONE TABLET BY MOUTH ONE TIME A DAY 90 tablet 1   ??? albuterol sulfate HFA (PROVENTIL HFA) 108 (90 BASE) MCG/ACT inhaler Inhale 2 puffs into the lungs every 6 hours as needed for Wheezing 3 Inhaler 3   ??? Blood Pressure Monitor KIT Use daily as needed 1 kit 0   ??? ondansetron (ZOFRAN) 4 MG tablet Take 1 tablet by mouth daily as needed for Nausea or Vomiting 30 tablet 1   ??? Multiple Vitamins-Minerals (MULTIVITAMIN PO) Take 1 tablet by mouth daily      ??? CALCIUM-VITAMIN D PO Take 1 tablet by mouth daily      ??? cetirizine (ZYRTEC ALLERGY) 10 MG tablet Take 10 mg by mouth daily.       No current facility-administered medications for this  visit.        Labs:   Lab Results   Component Value Date    WBC 8.6 07/15/2016    HGB 13.7 07/15/2016    HCT 42.6 07/15/2016    MCV 85.7 07/15/2016    PLT 170 07/15/2016     Lab Results   Component Value Date    NA 144 08/17/2016    K 4.5 08/17/2016    CL 103 08/17/2016    CO2 27 08/17/2016    BUN 15 08/17/2016    CREATININE 0.9 08/17/2016    GLUCOSE 104 08/17/2016    CALCIUM 9.7 08/17/2016      Lab Results   Component Value Date    TRIG 114 06/17/2016    HDL 43 06/17/2016    HDL 52 03/31/2011    LDLCALC 52 06/17/2016    LABVLDL 23 06/17/2016       Diagnostics:    Echo 04/06/16:  Summary   Normal left ventricle size and systolic function with an estimated ejection   fraction of 65%.  There is mild concentric left ventricular hypertrophy.   Mild mitral regurgitation is present.   The aortic valve appears sclerotic but opens well.   There is mild tricuspid regurgitation with RVSP estimated at 42 mmHg.   Mild pulmonic regurgitation present.   Definity was used to better delineate endocardium.    Cardiac Cath 07/17/16:  Anatomy:   LM-normal  LAD-widely patent stent otherwise normal  Cx-normal, dominant  OM1- normal  RCA-normal  LVEF- 70%, LVEDP 15 mmHg  ??  Impression:  1.  Widely patent LAD stent otherwise normal coronary arteries  2.  Hyperdynamic LV systolic function  3.  Non-cardiac chest pain     R/L Cardiac Cath 03/31/16:  Anatomy:   LM-normal   LAD-70% mid with FFR 0.77  Cx-normal, codominant  OM1- normal  RCA-normal  RPDA- normal  LVEF- 70%, LVEDP 19   PCI: LAD 70% to 0% with 3.25 mm x 18 mm Xience Alpine drug-eluting stent     Hemodynamics:  RA-15/12 mean 9  RV- 60/0, 9  PAWP-20/26 mean 19  PA- 60/12 mean 33     Assessment:  1. Chronic dCHF. NYHA class III. Appears compensated. On ACE, beta blocker and diuretics.    2.  Shortness of breath. Multifactorial: dCHF, COPD, obesity related hypoventilation and deconditioning.   3. Essential hypertension. Controlled. Goal under age 46, < 140/90   4. CAD s/p DES 03/2016.  Stable. On DAPT, statin and beta blocker therapy.       Plan:  1. Continue current medications.  2. Check labs (BMP) in one month (last labs Sept 11).  3. Referred to cardiac rehab.   4. Follow up with Dr Susy Manor in one month, HF in 2 mos.       Thank you for allowing me to participate in the care of your patient.    Jacqualine Code, CNP        QUALITY MEASURES  1. Tobacco Cessation Counseling: N/A  2. BP retake if >140/90: N/A  3. Communication to PCP: Office note forwarded/faxed to PCP  4. CAD antiplatelet therapy: yes  5. CAD lipid lowering therapy: yes  6. HF A. Fib on anticoagulation: N/A

## 2016-08-28 NOTE — Patient Instructions (Addendum)
1. Continue current medications.    2. Check labs (kidneys) in one month (last labs Sept 11).    3. Referred to cardiac rehab.     4. Follow up with Dr Susy Manor in one month, HF in 2 mos.

## 2016-09-01 ENCOUNTER — Telehealth

## 2016-09-01 ENCOUNTER — Inpatient Hospital Stay: Attending: Cardiovascular Disease | Primary: Sports Medicine

## 2016-09-01 ENCOUNTER — Encounter: Admit: 2016-09-02 | Primary: Sports Medicine

## 2016-09-01 ENCOUNTER — Emergency Department: Primary: Sports Medicine

## 2016-09-01 DIAGNOSIS — I5032 Chronic diastolic (congestive) heart failure: Secondary | ICD-10-CM

## 2016-09-01 DIAGNOSIS — R079 Chest pain, unspecified: Secondary | ICD-10-CM

## 2016-09-01 NOTE — ED Notes (Signed)
Pt refusing CT scan.  States she has had several CT scan before in the past and was told by Dr. Susy Manor not to get another scan.  Dr. Loree Fee and [place] PA-C notified     Jerrel Ivory, RN  09/01/16 [phone]

## 2016-09-01 NOTE — Telephone Encounter (Signed)
See if patient can come to lab. I would like to check BMP and BNP. Orders placed.  Thanks.

## 2016-09-01 NOTE — ED Notes (Signed)
Pt states she recently diagnosed with CHF back in April and take 80mg  of lasix for her swelling.  Pt states she began to notice some chest pressure, SOB and increased swelling in her lower extremities, hands and abd.  Pt called PCP and was told to take nitro x2 and did not help.  Pt came dto ED for eval and blood work.  Pt resting comfortably, does not appear to be in any severe distress, friend at bedside, NSR on monitr, VSS, call light in reach, will continue to monitor     Jerrel Ivory, RN  09/01/16 2141

## 2016-09-01 NOTE — ED Provider Notes (Signed)
I independently performed a history and physical on Rachael Mays.   All diagnostic, treatment, and disposition decisions were made by myself in conjunction with the advanced practice provider.     Briefly, this is a 60 y.o. female here for chest pain shortness of breath.  The patient has a history of just heart failure.  She states she developed suddenly worsening shortness of breath, particularly with exertion, since last Wednesday.  She is also given 9 pounds.  She does have pressure in her chest, but denies pain..    On exam, patient appears well and in no acute distress.  Her heart is regular rate and rhythm.  Breath sounds are diminished bilaterally.  She has +1 to +2 pitting edema in bilateral lower extremities. Breathing is unlabored. Speech is clear.     EKG  The Ekg interpreted by me in the absence of a cardiologist shows.  normal sinus rhythm with a rate of 69  Axis is   Left axis deviation  QTc is  normal  Intervals and Durations are unremarkable.      No specific ST-T wave changes appreciated.  No evidence of acute ischemia.   No significant change from prior EKG dated 07/15/2016      For further details of [facility] emergency department encounter, please see documentation by advanced practice provider.       Roxan Diesel, MD  09/01/16 2128

## 2016-09-01 NOTE — Telephone Encounter (Signed)
I spoke with pt swelling hands ankles , calves , SOB, chest feels heavy, fatigue.  Started this morning, weight 305. She states she is very lightheaded, radiaiting from chest to L arm. Takes Lasix 80 mg BID & Spironolactone. She gained 9 lb in a week.

## 2016-09-01 NOTE — ED Notes (Signed)
Bed: 12-01  Expected date:   Expected time:   Means of arrival:   Comments:  PS shortness of breath     Rachael Mays  09/01/16 2111

## 2016-09-01 NOTE — ED Provider Notes (Signed)
[facility]  eMERGENCY dEPARTMENT eNCOUnter        Pt Name: Rachael Mays  MRN: [id number]  [place] 02-Apr-1956  Date of evaluation: 09/01/2016  Provider: Bess Harvest, PA-C  PCP: Harvel Quale, MD  ED Attending: Roxan Diesel, MD    CHIEF COMPLAINT       Chief Complaint   Patient presents with   ??? Shortness of Breath     pt has CHF, gained 9 pounds since last wednesday, increased SOB and chest heaviness which pt states is normal when she becomes fluid overloaded       HISTORY OF PRESENT ILLNESS   (Location/Symptom, Timing/Onset, Context/Setting, Quality, Duration, Modifying Factors, Severity)  Note limiting factors.     Rachael Mays is a 60 y.o. female with a history of hypertension, hyperlipidemia, CAD, CHF, asthma, tobacco abuse, DVT and PE who presents to the emergency department tonight complaining of shortness of breath and chest heaviness that began today.  Patient states this is related to her CHF as she has gained 9 pounds in the last week.  She states her chest only feels heavy when she becomes short of breath.  She denies palpitations, diaphoresis, lightheadedness or dizziness.  She denies unilateral leg swelling or recent travel.  She denies fever, chills, recent illness or coughing.     Nursing Notes were all reviewed and agreed with or any disagreements were addressed  in the HPI.    REVIEW OF SYSTEMS    (2-9 systems for level 4, 10 or more for level 5)     Review of Systems   Constitutional: Negative for chills, diaphoresis, fatigue and fever.   Respiratory: Positive for shortness of breath. Negative for cough, chest tightness, wheezing and stridor.    Cardiovascular: Positive for chest pain. Negative for palpitations and leg swelling.   Gastrointestinal: Negative for abdominal distention, abdominal pain, diarrhea, nausea and vomiting.   Genitourinary: Negative for difficulty urinating, dysuria, flank pain, frequency, hematuria and urgency.    Musculoskeletal: Negative for arthralgias, back pain, neck pain and neck stiffness.   Neurological: Negative for dizziness, weakness, light-headedness, numbness and headaches.   All other systems reviewed and are negative.      Positives and Pertinent negatives as per HPI.  Except as noted above in the ROS, all other systems were reviewed and negative.       PAST MEDICAL HISTORY     Past Medical History:   Diagnosis Date   ??? Anxiety    ??? Asthma    ??? Blood circulation, collateral    ??? CAD (coronary artery disease)    ??? CHF (congestive heart failure) ([place])    ??? Chronic back pain    ??? Deep vein thrombosis    ??? Depression    ??? GERD (gastroesophageal reflux disease)     NO LONGER SINCE LAP   ??? GERD (gastroesophageal reflux disease) 01/23/2009   ??? Hx of blood clots    ??? Hyperlipidemia     hx; resolved with lap band   ??? Hypertension    ??? Obesity     hx of; had lap band   ??? Obstructive sleep apnea (adult) (pediatric) 09/18/2015   ??? Pulmonary embolism ([place])          SURGICAL HISTORY       Past Surgical History:   Procedure Laterality Date   ??? BACK SURGERY      neck  plates   ??? BREAST SURGERY  left lumpectomy   ??? CESAREAN SECTION     ??? CHOLECYSTECTOMY     ??? COLONOSCOPY     ??? COLONOSCOPY  04/08/10   ??? CORONARY ANGIOPLASTY     ??? DIAGNOSTIC CARDIAC CATH LAB PROCEDURE     ??? DILATATION, ESOPHAGUS     ??? ENDOSCOPY, COLON, DIAGNOSTIC     ??? HYSTERECTOMY     ??? LAP BAND  05/01/08    Dr. Nicole Cella   ??? OTHER SURGICAL HISTORY      [place] Filter: curently in place   ??? OTHER SURGICAL HISTORY  07/05/13    lap band removal   ??? PARTIAL HYSTERECTOMY     ??? SHOULDER SURGERY     ??? UPPER GASTROINTESTINAL ENDOSCOPY  05/19/13   ??? UPPER GASTROINTESTINAL ENDOSCOPY  07/21/13    ESOPHAGEAL STENT PLACEMENT   ??? UPPER GASTROINTESTINAL ENDOSCOPY N/A 07/31/2014    Esophagogastroduodenoscopy with esophageal balloon dilation   ??? VASCULAR SURGERY  04/2015    Lucendia Herrlich, celiac artery angiogram, normal abd arteries         CURRENT MEDICATIONS       Discharge  Medication List as of 09/02/2016  1:49 AM      CONTINUE these medications which have NOT CHANGED    Details   Handicap Placard MISC Starting 08/28/2016, Until Discontinued, Disp-1 each, R-0, PrintDX: chronic diastolic heart failure, dyspnea on exertion  Duration: 1 year      metolazone (ZAROXOLYN) 2.5 MG tablet Take 1 tablet by mouth every other day, Disp-2 tablet, R-0Normal      traZODone (DESYREL) 100 MG tablet TAKE ONE TABLET BY MOUTH NIGHTLY, Disp-90 tablet, R-1Normal      furosemide (LASIX) 40 MG tablet Take 80 mg in am and 40 mg in pm, Disp-270 tablet, R-3Normal      !! venlafaxine (EFFEXOR XR) 150 MG extended release capsule Take 1 capsule by mouth daily, Disp-90 capsule, R-3Normal      !! venlafaxine (EFFEXOR XR) 75 MG extended release capsule Take 2 capsules by mouth daily, Disp-90 capsule, R-3Normal      fluocinonide (LIDEX) 0.05 % external solution Apply sparingly to the back of the scalp daily if needed for itching., Disp-60 mL, R-1, Normal      spironolactone (ALDACTONE) 25 MG tablet Take 1 tablet by mouth daily, Disp-30 tablet, R-0Normal      metoprolol (LOPRESSOR) 100 MG tablet Take 0.5 tablets by mouth 2 times daily, Disp-270 tablet, R-1Normal      ALPRAZolam (XANAX) 0.25 MG tablet Take 1 tablet by mouth 2 times daily as needed for Anxiety, Disp-60 tablet, R-0Normal      nystatin (MYCOSTATIN) 100000 UNIT/GM cream Apply topically 2 times daily., Disp-30 g, R-2, Normal      lisinopril (PRINIVIL;ZESTRIL) 40 MG tablet Take 1 tablet by mouth daily Indications: Patient thought this was dc'd in December by PCP, Disp-90 tablet, R-3Normal      omeprazole (PRILOSEC) 40 MG delayed release capsule Take 1 capsule by mouth daily, Disp-90 capsule, R-3Normal      busPIRone (BUSPAR) 10 MG tablet Take 1 tablet by mouth 3 times daily, Disp-270 tablet, R-3Normal      oxyCODONE-acetaminophen (PERCOCET) 5-325 MG per tablet Take 1 tablet by mouth every 6 hours as needed for Pain ., Disp-40 tablet, R-0Print      nitroGLYCERIN  (NITROSTAT) 0.4 MG SL tablet up to max of 3 total doses. If no relief after 1 dose, call 911., Disp-25 tablet, R-3Print      aspirin 81 MG EC  tablet Take 1 tablet by mouth daily, Disp-30 tablet, R-11OTC      prasugrel (EFFIENT) 10 MG TABS Take 1 tablet by mouth daily, Disp-90 tablet, R-3Normal      triamcinolone (KENALOG) 0.1 % cream Apply to affected areas twice daily for up to 2 weeks or until improved., Disp-80 g, R-1, Normal      montelukast (SINGULAIR) 10 MG tablet TAKE ONE TABLET BY MOUTH ONE TIME A DAY, Disp-90 tablet, R-1Normal      albuterol sulfate HFA (PROVENTIL HFA) 108 (90 BASE) MCG/ACT inhaler Inhale 2 puffs into the lungs every 6 hours as needed for Wheezing, Disp-3 Inhaler, R-3      Blood Pressure Monitor KIT Disp-1 kit, R-0, NormalUse daily as needed      ondansetron (ZOFRAN) 4 MG tablet Take 1 tablet by mouth daily as needed for Nausea or Vomiting, Disp-30 tablet, R-1      Multiple Vitamins-Minerals (MULTIVITAMIN PO) Take 1 tablet by mouth daily Historical Med      CALCIUM-VITAMIN D PO Take 1 tablet by mouth daily Historical Med      cetirizine (ZYRTEC ALLERGY) 10 MG tablet Take 10 mg by mouth daily.       !! - Potential duplicate medications found. Please discuss with provider.            ALLERGIES     Morphine; Seasonal; and Talwin [pentazocine]    FAMILY HISTORY       Family History   Problem Relation Age of Onset   ??? Arthritis Mother    ??? Cancer Mother    ??? Depression Mother    ??? High Blood Pressure Mother    ??? Heart Disease Mother 47     MI    ??? Ovarian Cancer Mother 70   ??? Cancer Father 46     colon   ??? Heart Disease Maternal Grandmother    ??? Early Death Paternal Grandmother    ??? Heart Disease Paternal Grandfather    ??? Diabetes Other    ??? High Blood Pressure Other    ??? Obesity Other    ??? Other Sister      OSA   ??? Other Brother      OSA   ??? Other Brother      OSA   ??? Other Daughter      OSA          SOCIAL HISTORY       Social History     Social History   ??? Marital status: Married     Spouse  name: [name]   ??? Number of children: 2   ??? Years of education: 32     Social History Main Topics   ??? Smoking status: Former Smoker     Packs/day: 0.50     Years: 40.00     Types: Cigarettes     Quit date: 08/07/2012   ??? Smokeless tobacco: Former Systems developer     Quit date: 06/16/2013      Comment: started to smoke at age 62 / only smoked 0.5 p.p.d    ??? Alcohol use No   ??? Drug use: No   ??? Sexual activity: No     Other Topics Concern   ??? None     Social History Narrative    ** Merged History Encounter **            SCREENINGS             PHYSICAL EXAM    (  up to 7 for level 4, 8 or more for level 5)   ED Triage Vitals   BP Temp Temp Source Pulse Resp SpO2 Height Weight   09/01/16 2102 09/01/16 2102 09/01/16 2102 09/01/16 2102 09/01/16 2102 09/01/16 2102 09/01/16 2102 09/01/16 2106   136/72 98.2 ??F (36.8 ??C) Oral 68 16 94 % '5\' 6"'  (1.676 m) 290 lb (131.5 kg)       Physical Exam   Constitutional: She is oriented to person, place, and time. She appears well-developed and well-nourished. No distress.   HENT:   Head: Normocephalic and atraumatic.   Eyes: Right eye exhibits no discharge. Left eye exhibits no discharge.   Neck: Normal range of motion. Neck supple.   Cardiovascular: Normal rate, regular rhythm, normal heart sounds and intact distal pulses.  Exam reveals no gallop and no friction rub.    No murmur heard.  Pulmonary/Chest: Effort normal and breath sounds normal. No respiratory distress. She has no wheezes. She has no rales.   Abdominal: Soft. Bowel sounds are normal. She exhibits no distension. There is no tenderness. There is no rebound and no guarding.   Musculoskeletal: Normal range of motion.        Right lower leg: She exhibits edema.        Left lower leg: She exhibits edema.   Patient has a 1+ symmetric lower extremity edema bilaterally   Neurological: She is alert and oriented to person, place, and time.   Skin: Skin is warm and dry. She is not diaphoretic. No pallor.   Psychiatric: She has a normal mood and affect.  Her behavior is normal.   Nursing note and vitals reviewed.      DIAGNOSTIC RESULTS   LABS:    Labs Reviewed   CBC WITH AUTO DIFFERENTIAL - Abnormal; Notable for the following:        Result Value    RDW 16.0 (*)     All other components within normal limits    Narrative:     Performed at:  [facility]  [address]  [place] [address]   Phone [phone]   BRAIN NATRIURETIC PEPTIDE - Abnormal; Notable for the following:     Pro-BNP 293 (*)     All other components within normal limits    Narrative:     Performed at:  [facility]  [address]  [place] [address]   Phone [phone]   BASIC METABOLIC PANEL - Abnormal; Notable for the following:     Glucose 130 (*)     All other components within normal limits    Narrative:     Performed at:  [facility]  [address],  [place] [address]   Phone [phone]   TROPONIN    Narrative:     Performed at:  [facility]  [address],  [place] [address]   Phone [phone]   TROPONIN    Narrative:     Performed at:  [facility]  [address]  [place] [address]   Phone [phone]       All other labs were within normal range or not returned as of this dictation.    EKG: All EKG's are interpreted by the Emergency Department Physician who either signs or Co-signs this chart in the absence of a cardiologist.  Please see  their note for interpretation of EKG.    Please see attending physician's note    RADIOLOGY:   Non-plain film images such as CT, Ultrasound and MRI are read by the radiologist. Plain radiographic images are visualized and preliminarily interpreted by the  ED Provider with the below findings:    Interpretation per the Radiologist below, if available at the time of this note:    XR CHEST STANDARD (2 VW)   Final Result   No acute disease.           Xr Chest Standard  (2 Vw)    Result Date: 09/01/2016  EXAMINATION: TWO VIEWS OF THE CHEST 09/01/2016 9:06 pm COMPARISON: 07/15/2016 HISTORY: ORDERING PHYSICIAN PROVIDED HISTORY: short of breath, possible CHF TECHNOLOGIST PROVIDED HISTORY: Technologist Provided Reason for Exam: Pt has CHF, gained 9 pounds since last wednesday, increased SOB and chest heaviness which pt states is normal when she becomes fluid overloaded. Acuity: Chronic Type of Encounter: Initial History of asthma, deep venous thrombosis, hyperlipidemia, gastroesophageal reflux, hypertension FINDINGS: No acute or focal bony abnormality.  The heart size and mediastinal contours are normal.  The lungs are clear.     No acute disease.     PROCEDURES   Unless otherwise noted below, none     Procedures    CRITICAL CARE TIME   N/A    CONSULTS:  IP CONSULT TO INTERNAL MEDICINE  IP CONSULT TO CARDIOLOGY      EMERGENCY DEPARTMENT COURSE and DIFFERENTIAL DIAGNOSIS/MDM:   Vitals:    Vitals:    09/01/16 2102 09/01/16 2106 09/01/16 2345 09/02/16 0145   BP: 136/72  129/65 119/73   Pulse: 68  63 61   Resp: '16  16 17   ' Temp: 98.2 ??F (36.8 ??C)      TempSrc: Oral      SpO2: 94%  95% 95%   Weight:  290 lb (131.5 kg)     Height: '5\' 6"'  (1.676 m)          Patient was given the following medications:  Medications   iopamidol (ISOVUE-370) 76 % injection 75 mL (not administered)   promethazine (PHENERGAN) tablet 25 mg (25 mg Oral Given 09/02/16 0204)     Patient with a history of hypertension, hyperlipidemia, CAD, CHF, asthma, tobacco abuse, DVT and PE presented to the emergency department tonight complaining of shortness of breath and occasional chest heaviness she has had since earlier today.  She believes she has gained 9 pounds and is concerned for a CHF exacerbation.  Patient is hemodynamically stable and nonfebrile on arrival.  Her lungs are CTA bilaterally.  She has a very mild lower extremity edema that is very symmetric.  Her EKG is negative for signs of acute ischemia or infarction.   Initial troponin is <0.01.  ProBNP is 293 and chest x-ray shows no acute cardiopulmonary disease.  CBC and BMP are unremarkable.  With her complaint of chest pain and shortness of breath and history of DVT/PE a CTA pulmonary is completed.  Patient refuses to have this completed as she states she was told by her cardiologist that she has had multiple CTs of her chest and states she does not believe she has a blood clot.  A 3 hour troponin is again <0.01.  I did consult patient's cardiologist, Dr. Earleen Newport, who requested that the patient follow-up in the office today.  She also does not believe admission is warranted at this time.  Patient has had 6 admissions this year for chest  pain/shortness of breath.  She had a cardiac cath last month that was normal. Patient's oxygen saturations are good.  I do not believe the patient's symptoms today are due to pulmonary embolism, pulmonary edema, pneumonia, pneumothorax, ACS, CHF, status asthmaticus, acute respiratory failure, profound anemia or metabolic abnormality, amongst other more emergent diagnostic considerations.  Patient was advised to immediately return to emergency department if symptoms worsen.     The patient tolerated their visit well.  They were seen and evaluated by the attending physician, Roxan Diesel, MD who agreed with the assessment and plan.  The patient and / or the family were informed of the results of any tests, a time was given to answer questions, a plan was proposed and they agreed with plan.        FINAL IMPRESSION      1. Chest pain, unspecified type    2. SOB (shortness of breath)          DISPOSITION/PLAN   DISPOSITION Decision to Discharge    PATIENT REFERRED TO:  Harvel Quale, [place] [address]  [phone]    Schedule an appointment as soon as possible for a visit      Connye Burkitt, MD  [address]  [address]  [place] [address]  [phone]    Schedule an appointment as soon as possible for a  visit        DISCHARGE MEDICATIONS:  Discharge Medication List as of 09/02/2016  1:49 AM          DISCONTINUED MEDICATIONS:  Discharge Medication List as of 09/02/2016  1:49 AM                 (Please note that portions of this note were completed with a voice recognition program.  Efforts were made to edit the dictations but occasionally words are mis-transcribed.)    Bess Harvest, PA-C (electronically signed)           Bess Harvest, PA-C  09/02/16 [phone]

## 2016-09-01 NOTE — Telephone Encounter (Signed)
I spoke with pt and relayed message per [facility]. She verbalized understanding. I suggested going today and she should go to the ER with worsening s/s

## 2016-09-01 NOTE — Telephone Encounter (Signed)
Pt called to adv that she has gained 9 lbs with in a week, she has sob, her stomach is hard and swollen, her ankles, calves, and hands are swollen as well. Please call to adv, thank you

## 2016-09-02 ENCOUNTER — Ambulatory Visit
Admit: 2016-09-02 | Discharge: 2016-09-02 | Payer: PRIVATE HEALTH INSURANCE | Attending: Clinical Nurse Specialist | Primary: Sports Medicine

## 2016-09-02 ENCOUNTER — Inpatient Hospital Stay
Admit: 2016-09-02 | Discharge: 2016-09-02 | Disposition: A | Discharge status: Home / Self Care | Payer: PRIVATE HEALTH INSURANCE

## 2016-09-02 DIAGNOSIS — I5032 Chronic diastolic (congestive) heart failure: Secondary | ICD-10-CM

## 2016-09-02 LAB — BASIC METABOLIC PANEL
Anion Gap: 11 (ref 3–16)
Anion Gap: 12 (ref 3–16)
BUN: 15 mg/dL (ref 7–20)
BUN: 15 mg/dL (ref 7–20)
CO2: 27 mmol/L (ref 21–32)
CO2: 29 mmol/L (ref 21–32)
Calcium: 9.2 mg/dL (ref 8.3–10.6)
Calcium: 9.4 mg/dL (ref 8.3–10.6)
Chloride: 100 mmol/L (ref 99–110)
Chloride: 104 mmol/L (ref 99–110)
Creatinine: 0.8 mg/dL (ref 0.6–1.1)
Creatinine: 0.9 mg/dL (ref 0.6–1.1)
GFR African American: >60 (ref >60)
GFR African American: >60 (ref >60)
GFR Non-African American: >60 (ref >60)
GFR Non-African American: >60 (ref >60)
Glucose: 130 mg/dL — ABNORMAL HIGH (ref 70–99)
Glucose: 155 mg/dL — ABNORMAL HIGH (ref 70–99)
Potassium: 4.1 mmol/L (ref 3.5–5.1)
Potassium: 4.2 mmol/L (ref 3.5–5.1)
Sodium: 140 mmol/L (ref 136–145)
Sodium: 143 mmol/L (ref 136–145)

## 2016-09-02 LAB — TROPONIN
Troponin: <0.01 ng/mL (ref <0.01)
Troponin: <0.01 ng/mL (ref <0.01)

## 2016-09-02 LAB — CBC WITH AUTO DIFFERENTIAL
Basophils %: 0.6 %
Basophils Absolute: 0 10*3/uL (ref 0.0–0.2)
Eosinophils %: 4.1 %
Eosinophils Absolute: 0.3 10*3/uL (ref 0.0–0.6)
Hematocrit: 37.9 % (ref 36.0–48.0)
Hemoglobin: 12.5 g/dL (ref 12.0–16.0)
Lymphocytes %: 39.3 %
Lymphocytes Absolute: 3 10*3/uL (ref 1.0–5.1)
MCH: 28.2 pg (ref 26.0–34.0)
MCHC: 33 g/dL (ref 31.0–36.0)
MCV: 85.6 fL (ref 80.0–100.0)
MPV: 8.8 fL (ref 5.0–10.5)
Monocytes %: 9.8 %
Monocytes Absolute: 0.7 10*3/uL (ref 0.0–1.3)
Neutrophils %: 46.2 %
Neutrophils Absolute: 3.5 10*3/uL (ref 1.7–7.7)
Platelets: 184 10*3/uL (ref 135–450)
RBC: 4.43 M/uL (ref 4.00–5.20)
RDW: 16 % — ABNORMAL HIGH (ref 12.4–15.4)
WBC: 7.6 10*3/uL (ref 4.0–11.0)

## 2016-09-02 LAB — BRAIN NATRIURETIC PEPTIDE
Pro-BNP: 266 pg/mL — ABNORMAL HIGH (ref 0–124)
Pro-BNP: 293 pg/mL — ABNORMAL HIGH (ref 0–124)

## 2016-09-02 MED ORDER — PROMETHAZINE HCL 25 MG PO TABS
25 MG | Freq: Once | ORAL | Status: AC
Start: 2016-09-02 — End: 2016-09-02
  Administered 2016-09-02: 06:00:00 25 mg via ORAL

## 2016-09-02 MED ORDER — IOPAMIDOL 76 % IV SOLN
76 % | Freq: Once | INTRAVENOUS | Status: DC | PRN
Start: 2016-09-02 — End: 2016-09-02

## 2016-09-02 MED FILL — PROMETHAZINE HCL 25 MG PO TABS: 25 MG | ORAL | Qty: 1

## 2016-09-02 NOTE — ED Notes (Signed)
VS obtained. Pt medicated per MAR. Reviewed discharge instructions, home meds, and follow up care with patient, verbalized understanding.      Zara Chess, RN  09/02/16 [phone]

## 2016-09-02 NOTE — Progress Notes (Signed)
[place]  Progress Note    Primary Care Doctor:  Harvel Quale, MD    Chief Complaint   Patient presents with   ??? Check-Up     ED follow up (09/01/16)     ??? Coronary Artery Disease   ??? Hypertension   ??? Hyperlipidemia   ??? Edema     swelling in feet, hands and abdomen   ??? Chest Pain     steady ache - with numbness in L arm; patient has had this ache in chest prior to ED visit   ??? Shortness of Breath   ??? Fatigue   ??? Dizziness        History of Present Illness:  Rachael Mays is a 60 y.o. 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 She has been experiencing chronic chest pain and shortness of breath since April 2017. She underwent LHC (03/2016) that revealed significant stenosis of the LAD and a DES was placed. RHC was also performed which showed PA mean 33 mmHg, PCWP 18 mmHg (transpulmonary gradient 14), PVR 2.134 Wood units.  Symptoms have not improved post stent. She has been seen by Dr Jeb Levering and CTA/CTPA (03/2016) with no evidence of PE or acute abnormality. Echo 04/06/16 with concentric LVH, EF 65% and RVSP 42 mmHg. She was hospitalized 8/9-8/11 after presenting with shortness of breath and chest pain. Repeat LHC demonstrated patent stent LAD. Lopressor was decreased secondary bradycardia and aldactone added.    I had the pleasure of seeing Rachael Mays in follow up for ER visit last night for left shoulder pain and heaviness with numbness in her fingers.  She had called yesterday to the office with complaints of weight gain, recommended to have labs done which were done at 430p.  She went to ER around 9pm with complaints of the above pain.  Troponin levels were normal and remaining labs normal.  Chest xray showed no acute disease.   She has several other complaints of fatigue, sob, dizziness, edema but the left shoulder pain has almost resolved.  She was seen in the office 5 days earlier.   She has had titanium  plates in her neck placed about 11 years ago.  She lives at home with her husband, does not work.  Her husband stayed in the waiting room.      Past Medical History:   has a past medical history of Anxiety; Asthma; Blood circulation, collateral; CAD (coronary artery disease); CHF (congestive heart failure) ([place]); Chronic back pain; Deep vein thrombosis; Depression; GERD (gastroesophageal reflux disease); GERD (gastroesophageal reflux disease); Hx of blood clots; Hyperlipidemia; Hypertension; Obesity; Obstructive sleep apnea (adult) (pediatric); and Pulmonary embolism ([place]).  Surgical History:   has a past surgical history that includes Colonoscopy; Cesarean section; Cholecystectomy; Hysterectomy; shoulder surgery; Lap Band (05/01/08); back surgery; Colonoscopy (04/08/10); other surgical history; Upper gastrointestinal endoscopy (05/19/13); other surgical history (07/05/13); Upper gastrointestinal endoscopy (07/21/13); partial hysterectomy (cervix not removed); Upper gastrointestinal endoscopy (N/A, 07/31/2014); vascular surgery (04/2015); Diagnostic Cardiac Cath Lab Procedure; Coronary angioplasty; Endoscopy, colon, diagnostic; Breast surgery; and Dilatation, esophagus.   Social History:   reports that she quit smoking about 4 years ago. Her smoking use included Cigarettes. She has a 20.00 pack-year smoking history. She quit smokeless tobacco use about 3 years ago. She reports that she does not drink alcohol or use illicit drugs.   Family History:   Family History   Problem Relation  Age of Onset   ??? Arthritis Mother    ??? Cancer Mother    ??? Depression Mother    ??? High Blood Pressure Mother    ??? Heart Disease Mother 51     MI    ??? Ovarian Cancer Mother 64   ??? Cancer Father 12     colon   ??? Heart Disease Maternal Grandmother    ??? Early Death Paternal Grandmother    ??? Heart Disease Paternal Grandfather    ??? Diabetes Other    ??? High Blood Pressure Other    ??? Obesity Other    ??? Other Sister      OSA   ??? Other Brother      OSA    ??? Other Brother      OSA   ??? Other Daughter      OSA       Home Medications:  Prior to Admission medications    Medication Sig Start Date End Date Taking? Authorizing Provider   Handicap Placard MISC by Does not apply route DX: chronic diastolic heart failure, dyspnea on exertion    Duration: 1 year 08/28/16  Yes Dorian Furnace, CNP   traZODone (DESYREL) 100 MG tablet TAKE ONE TABLET BY MOUTH NIGHTLY 08/03/16  Yes Harvel Quale, MD   furosemide (LASIX) 40 MG tablet Take 80 mg in am and 40 mg in pm  Patient taking differently: 80 mg 2 times daily  07/27/16  Yes Harvel Quale, MD   venlafaxine (EFFEXOR XR) 150 MG extended release capsule Take 1 capsule by mouth daily 07/27/16  Yes Harvel Quale, MD   venlafaxine (EFFEXOR XR) 75 MG extended release capsule Take 2 capsules by mouth daily  Patient taking differently: Take 75 mg by mouth daily  07/27/16  Yes Harvel Quale, MD   fluocinonide (LIDEX) 0.05 % external solution Apply sparingly to the back of the scalp daily if needed for itching. 07/23/16  Yes Francisca December, MD   spironolactone (ALDACTONE) 25 MG tablet Take 1 tablet by mouth daily 07/22/16  Yes Dorian Furnace, CNP   metoprolol (LOPRESSOR) 100 MG tablet Take 0.5 tablets by mouth 2 times daily 07/17/16  Yes Rana Anders Grant, MD   ALPRAZolam Duanne Moron) 0.25 MG tablet Take 1 tablet by mouth 2 times daily as needed for Anxiety 06/22/16  Yes Harvel Quale, MD   nystatin (MYCOSTATIN) 100000 UNIT/GM cream Apply topically 2 times daily. 05/05/16  Yes Francisca December, MD   lisinopril (PRINIVIL;ZESTRIL) 40 MG tablet Take 1 tablet by mouth daily Indications: Patient thought this was dc'd in December by PCP  Patient taking differently: Take 40 mg by mouth daily  04/09/16  Yes Harvel Quale, MD   omeprazole [facility]) 40 MG delayed release capsule Take 1 capsule by mouth daily 04/09/16  Yes Harvel Quale, MD   busPIRone (BUSPAR) 10 MG tablet Take 1 tablet by  mouth 3 times daily 04/09/16  Yes Harvel Quale, MD   oxyCODONE-acetaminophen (PERCOCET) 5-325 MG per tablet Take 1 tablet by mouth every 6 hours as needed for Pain . 04/07/16  Yes Bhumit Patel, MD   nitroGLYCERIN (NITROSTAT) 0.4 MG SL tablet up to max of 3 total doses. If no relief after 1 dose, call 911. 04/06/16  Yes Bhumit Patel, MD   aspirin 81 MG EC tablet Take 1 tablet by mouth daily 04/02/16  Yes Leodis Binet,  MD   prasugrel (EFFIENT) 10 MG TABS Take 1 tablet by mouth daily 04/02/16  Yes Leodis Binet, MD   triamcinolone (KENALOG) 0.1 % cream Apply to affected areas twice daily for up to 2 weeks or until improved. 03/23/16  Yes Francisca December, MD   montelukast (SINGULAIR) 10 MG tablet TAKE ONE TABLET BY MOUTH ONE TIME A DAY 03/18/16  Yes Jamal Collin, MD   albuterol sulfate HFA (PROVENTIL HFA) 108 (90 BASE) MCG/ACT inhaler Inhale 2 puffs into the lungs every 6 hours as needed for Wheezing 12/27/15  Yes Harvel Quale, MD   Blood Pressure Monitor KIT Use daily as needed 12/20/15  Yes Harvel Quale, MD   ondansetron [facility]) 4 MG tablet Take 1 tablet by mouth daily as needed for Nausea or Vomiting 06/03/15  Yes Harvel Quale, MD   Multiple Vitamins-Minerals (MULTIVITAMIN PO) Take 1 tablet by mouth daily    Yes Historical Provider, MD   CALCIUM-VITAMIN D PO Take 1 tablet by mouth daily    Yes Historical Provider, MD   cetirizine (ZYRTEC ALLERGY) 10 MG tablet Take 10 mg by mouth daily.   Yes Historical Provider, MD        Allergies:  Morphine; Seasonal; and Talwin [pentazocine]     Review of Systems:   ?? Constitutional: there has been no unanticipated weight loss. There's been no change in energy level, sleep pattern, or activity level.   +fatigue  ?? Eyes: No visual changes or diplopia. No scleral icterus.  ?? ENT: No Headaches, hearing loss or vertigo. No mouth sores or sore throat.  ?? Cardiovascular: Reviewed in HPI  ?? Respiratory: No cough or wheezing, no sputum production.  No hematemesis.  +sob  ?? Gastrointestinal: No abdominal pain, appetite loss, blood in stools. No change in bowel or bladder habits.  ?? Genitourinary: No dysuria, trouble voiding, or hematuria.  ?? Musculoskeletal:  No gait disturbance, weakness or joint complaints.+edema  ?? Integumentary: No rash or pruritis.  ?? Neurological: No headache, diplopia, change in muscle strength, numbness or tingling.+dizziness No change in gait, balance, coordination, mood, affect, memory, mentation, behavior.  ?? Psychiatric: No anxiety, no depression.  ?? Endocrine: No malaise, fatigue or temperature intolerance. No excessive thirst, fluid intake, or urination. No tremor.  ?? Hematologic/Lymphatic: No abnormal bruising or bleeding, blood clots or swollen lymph nodes.  ?? Allergic/Immunologic: No nasal congestion or hives.    Physical Examination:    Vitals:    09/02/16 1131   BP: 100/60   Site: Left Arm   Position: Sitting   Cuff Size: Large Adult   Pulse: 62   Resp: 16   SpO2: 95%   Weight: (!) 302 lb 12.8 oz (137.3 kg)   Height: _0  (1.676 m)        Constitutional and General Appearance: Warm and dry, no apparent distress, normal coloration  HEENT:  Normocephalic, atraumatic  Respiratory:  ?? Normal excursion and expansion without use of accessory muscles  ?? Resp Auscultation: Normal breath sounds without dullness  Cardiovascular:  ?? The apical impulses not displaced  ?? Heart tones are crisp and normal  ?? JVP normal cm H2O  ?? Regular rate and rhythm  ?? Peripheral pulses are symmetrical and full  ?? There is no clubbing, cyanosis of the extremities.  ?? no edema  ?? Pedal Pulses: 2+ and equal   Abdomen:  ?? No masses or tenderness  ?? Liver/Spleen: No Abnormalities Noted  Neurological/Psychiatric:  ??  Alert and oriented in all spheres  ?? Moves all extremities well  ?? Exhibits normal gait balance and coordination  ?? No abnormalities of mood, affect, memory, mentation, or behavior are noted    Lab Data:    CBC:   Lab Results   Component Value  Date    WBC 7.6 09/01/2016    WBC 8.6 07/15/2016    WBC 7.3 06/17/2016    RBC 4.43 09/01/2016    RBC 4.97 07/15/2016    RBC 4.58 06/17/2016    HGB 12.5 09/01/2016    HGB 13.7 07/15/2016    HGB 12.5 06/17/2016    HCT 37.9 09/01/2016    HCT 42.6 07/15/2016    HCT 39.4 06/17/2016    MCV 85.6 09/01/2016    MCV 85.7 07/15/2016    MCV 86.2 06/17/2016    RDW 16.0 09/01/2016    RDW 16.1 07/15/2016    RDW 15.4 06/17/2016    PLT 184 09/01/2016    PLT 170 07/15/2016    PLT 152 06/17/2016     BMP:  Lab Results   Component Value Date    NA 140 09/01/2016    NA 143 09/01/2016    NA 144 08/17/2016    K 4.2 09/01/2016    K 4.1 09/01/2016    K 4.5 08/17/2016    CL 100 09/01/2016    CL 104 09/01/2016    CL 103 08/17/2016    CO2 29 09/01/2016    CO2 27 09/01/2016    CO2 27 08/17/2016    PHOS 3.8 08/21/2013    PHOS 3.3 08/18/2013    PHOS 3.5 08/17/2013    BUN 15 09/01/2016    BUN 15 09/01/2016    BUN 15 08/17/2016    CREATININE 0.9 09/01/2016    CREATININE 0.8 09/01/2016    CREATININE 0.9 08/17/2016     BNP:   Lab Results   Component Value Date    PROBNP 293 09/01/2016    PROBNP 266 09/01/2016    PROBNP 103 08/06/2016     Cardiac Imaging:    Echo 04/06/16:  Summary   Normal left ventricle size and systolic function with an estimated ejection   fraction of 65%.   There is mild concentric left ventricular hypertrophy.   Mild mitral regurgitation is present.   The aortic valve appears sclerotic but opens well.   There is mild tricuspid regurgitation with RVSP estimated at 42 mmHg.   Mild pulmonic regurgitation present.   Definity was used to better delineate endocardium.   Cardiac Cath 07/17/16:  Anatomy:   LM-normal  LAD-widely patent stent otherwise normal  Cx-normal, dominant  OM1- normal  RCA-normal  LVEF- 70%, LVEDP 15 mmHg      Impression:  1.  Widely patent LAD stent otherwise normal coronary arteries  2.  Hyperdynamic LV systolic function  3.  Non-cardiac chest pain     R/L Cardiac Cath 03/31/16:  Anatomy:   LM-normal   LAD-70% mid  with FFR 0.77  Cx-normal, codominant  OM1- normal  RCA-normal  RPDA- normal  LVEF- 70%, LVEDP 19   PCI: LAD 70% to 0% with 3.25 mm x 18 mm Xience Alpine drug-eluting stent     Hemodynamics:  RA-15/12 mean 9  RV- 60/0, 9  PAWP-20/26 mean 19  PA- 60/12 mean 33      Assessment:    1. Chronic diastolic congestive heart failure (HCC) , stable   2. Shortness of breath    3. Essential hypertension ,  controlled   4. Status post insertion of drug-eluting stent into left anterior descending (LAD) artery , on effient, lipitor not on list       Plan:   1. Continue all current medications  2. Cardiac rehab order at last appointment  3. Follow up with Santiago Glad 10/08/16 at 2pm  4. Recommend her follow up with ortho regarding neck pain and tingling  5. She needs to be on lipitor.  I have asked the office to see when, why or if she is taking this.  She needs to be on 80 mg daily due to DES placed on 03/2016      I appreciate the opportunity of cooperating in the care of this individual.    Ernesto Rutherford, CNS, 09/02/2016, 1:58 PM    QUALITY MEASURES  1. Tobacco Cessation Counseling: NA  2. Retake of BP if >140/90:   NA  3. Documentation to PCP/referring for new patient:  Sent to PCP at close of office visit  4. CAD patient on anti-platelet: Yes  5. CAD patient on STATIN therapy:  No  6. Patient with CHF and aFib on anticoagulation:  NA

## 2016-09-02 NOTE — Discharge Instructions (Signed)
Chest Pain: Care Instructions  Your Care Instructions  There are many things that can cause chest pain. Some are not serious and will get better on their own in a few days. But some kinds of chest pain need more testing and treatment. Your doctor may have recommended a follow-up visit in the next 8 to 12 hours. If you are not getting better, you may need more tests or treatment.  Even though your doctor has released you, you still need to watch for any problems. The doctor carefully checked you, but sometimes problems can develop later. If you have new symptoms or if your symptoms do not get better, get medical care right away.  If you have worse or different chest pain or pressure that lasts more than 5 minutes or you passed out (lost consciousness), call 911 or seek other emergency help right away.   A medical visit is only one step in your treatment. Even if you feel better, you still need to do what your doctor recommends, such as going to all suggested follow-up appointments and taking medicines exactly as directed. This will help you recover and help prevent future problems.  How can you care for yourself at home?   Rest until you feel better.   Take your medicine exactly as prescribed. Call your doctor if you think you are having a problem with your medicine.   Do not drive after taking a prescription pain medicine.  When should you call for help?  Call 911 if:   You passed out (lost consciousness).   You have severe difficulty breathing.   You have symptoms of a heart attack. These may include:   Chest pain or pressure, or a strange feeling in your chest.   Sweating.   Shortness of breath.   Nausea or vomiting.   Pain, pressure, or a strange feeling in your back, neck, jaw, or upper belly or in one or both shoulders or arms.   Lightheadedness or sudden weakness.   A fast or irregular heartbeat.  After you call 911, the operator may tell you to chew 1 adult-strength or 2 to 4 low-dose  aspirin. Wait for an ambulance. Do not try to drive yourself.  Call your doctor today if:   You have any trouble breathing.   Your chest pain gets worse.   You are dizzy or lightheaded, or you feel like you may faint.   You are not getting better as expected.   You are having new or different chest pain.  Where can you learn more?  Go to [email or web address] and sign in to your MyChart account. Enter A120 in the Search Health Information box to learn more about "Chest Pain: Care Instructions."     If you do not have an account, please click on the "Sign Up Now" link.  Current as of: February 24, 2016  Content Version: 11.3   2006-2017 [employer]. Care instructions adapted under license by [facility]. If you have questions about a medical condition or this instruction, always ask your healthcare professional. [employer], Inc[employer] disclaims any warranty or liability for your use of this information.         Shortness of Breath: Care Instructions  Your Care Instructions  Shortness of breath has many causes. Sometimes conditions such as anxiety can lead to shortness of breath. Some people get mild shortness of breath when they exercise. Trouble breathing also can be a symptom of a serious problem, such as asthma, lung disease, emphysema, heart   problems, and pneumonia.  If your shortness of breath continues, you may need tests and treatment. Watch for any changes in your breathing and other symptoms.  Follow-up care is a key part of your treatment and safety. Be sure to make and go to all appointments, and call your doctor if you are having problems. It's also a good idea to know your test results and keep a list of the medicines you take.  How can you care for yourself at home?   Do not smoke or allow others to smoke around you. If you need help quitting, talk to your doctor about stop-smoking programs and medicines. These can increase your chances of quitting for good.   Get  plenty of rest and sleep.   Take your medicines exactly as prescribed. Call your doctor if you think you are having a problem with your medicine.   Find healthy ways to deal with stress.   Exercise daily.   Get plenty of sleep.   Eat regularly and well.  When should you call for help?  Call 911 anytime you think you may need emergency care. For example, call if:   You have severe shortness of breath.   You have symptoms of a heart attack. These may include:   Chest pain or pressure, or a strange feeling in the chest.   Sweating.   Shortness of breath.   Nausea or vomiting.   Pain, pressure, or a strange feeling in the back, neck, jaw, or upper belly or in one or both shoulders or arms.   Lightheadedness or sudden weakness.   A fast or irregular heartbeat.  After you call 911, the operator may tell you to chew 1 adult-strength or 2 to 4 low-dose aspirin. Wait for an ambulance. Do not try to drive yourself.  Call your doctor now or seek immediate medical care if:   Your shortness of breath gets worse or you start to wheeze. Wheezing is a high-pitched sound when you breathe.   You wake up at night out of breath or have to prop your head up on several pillows to breathe.   You are short of breath after only light activity or while at rest.  Watch closely for changes in your health, and be sure to contact your doctor if:   You do not get better over the next 1 to 2 days.  Where can you learn more?  Go to [email or web address] and sign in to your MyChart account. Enter S780 in the Search Health Information box to learn more about "Shortness of Breath: Care Instructions."     If you do not have an account, please click on the "Sign Up Now" link.  Current as of: February 29, 2016  Content Version: 11.3   2006-2017 [employer]. Care instructions adapted under license by [facility]. If you have questions about a medical condition or this instruction, always ask your healthcare  professional. [employer] disclaims any warranty or liability for your use of this information.

## 2016-09-02 NOTE — Patient Instructions (Signed)
1. Continue all current medications  2. Cardiac rehab order at last appointment  3. Follow up with Santiago Glad 10/08/16 at 2pm  4. Recommend her follow up with ortho regarding neck pain and tingling

## 2016-09-02 NOTE — Care Coordination-Inpatient (Signed)
Ambulatory Care Coordination ED Follow up Call       Reason for ED Visit:  Shortness of Breath   Care Management Risk Score: CMRS 15  How are you feeling? :     improved  Patient Reports the following:  none             Contact RNCC regarding any worsening symptoms from above.     Did you call your PCP prior to going to the ED?  No contacted Cardiologist office         Post Discharge Status:  What health concerns since you left the Emergency Room?  None     Do you have wounds that you are caring for at home? No    Do you have a follow up appt scheduled?  Yes followed up with Cardiologist office today. Did not want to schedule a follow up appointment with PCP office     Review of Instructions:                                 Do you have any questions regarding your discharge instructions?:  No  Medications:    What questions do you have about your medications?  None   Are you taking your medications as directed? If not - why? Yes   Can you afford your medications?  yes  ADLS:  Do you need assistance of any kind at home? No   What assistance is needed?        FU appts/Provider:    Future Appointments  Date Time Provider [place]   10/08/2016 2:00 PM Dorian Furnace, CNP FF Cardio [facility]   11/10/2016 12:20 PM Tiffanie Rita Ohara, CNP FF SLEEP MED [facility]   12/28/2016 2:00 PM Francisca December, MD [place] Maintenance Due   Topic Date Due   ??? Hepatitis C screen  [birth year]   ??? HIV screen  07/02/1973   ??? Pneumococcal med risk (1 of 1 - PPSV23) 07/02/1977   ??? Cervical cancer screen  07/03/1979   ??? Flu vaccine (1) 08/07/2016     Patient advised to contact PCP office to have HM items/records faxed to PCP Office directly?  yes     Instructed patient to contact PCP office with any needs or concerns, she voiced understanding and was agreeable.     [place] Coordination Support Specialist  [phone]

## 2016-09-02 NOTE — Telephone Encounter (Signed)
LVM for patient to return my call.

## 2016-09-02 NOTE — Telephone Encounter (Signed)
Please call patient and see if she is taking her lipitor 80 mg daily.  I was completing my note and I do not see this on her medication list  If she is not, then she needs to resume and if she stopped it, why??  thanks

## 2016-09-03 LAB — EKG 12-LEAD
Atrial Rate: 69 {beats}/min
Diagnosis: NORMAL
P Axis: 55 degrees
P-R Interval: 138 ms
Q-T Interval: 420 ms
QRS Duration: 78 ms
QTc Calculation (Bazett): 450 ms
R Axis: 0 degrees
T Axis: 28 degrees
Ventricular Rate: 69 {beats}/min

## 2016-09-03 NOTE — Telephone Encounter (Signed)
Attempted to reach patient again, LVM to please call our office

## 2016-09-07 NOTE — Telephone Encounter (Signed)
Patient returned call.  Her PCP discontinued the lipitor on 07/28/16 due to patient having leg cramps/muscle pain.

## 2016-09-07 NOTE — Telephone Encounter (Signed)
Call placed to patient.  Had to leave another VM asking her to please call our office.

## 2016-09-11 NOTE — Care Coordination-Inpatient (Signed)
Attempted to reach patient for follow up. Message left on home phone. Attempted cell number but VM not set up.

## 2016-09-16 NOTE — Care Coordination-Inpatient (Signed)
Left second message on patient's home phone. Left my contact info and requested call back. Mobile VM is not set up. Will attempt to reach patient again next week.

## 2016-09-28 MED ORDER — SPIRONOLACTONE 25 MG PO TABS
25 | ORAL_TABLET | Freq: Every day | ORAL | 0 refills | Status: DC
Start: 2016-09-28 — End: 2017-01-11

## 2016-09-28 NOTE — Telephone Encounter (Signed)
LF8/16/17 #30, 0RF    LOV 09/02/16  FU scheduled 10/08/16  Labs 09/01/16

## 2016-09-30 NOTE — Care Coordination-Inpatient (Signed)
CC intro letter sent.

## 2016-10-08 ENCOUNTER — Ambulatory Visit
Admit: 2016-10-08 | Discharge: 2016-10-08 | Payer: PRIVATE HEALTH INSURANCE | Attending: Acute Care | Primary: Sports Medicine

## 2016-10-08 DIAGNOSIS — I5032 Chronic diastolic (congestive) heart failure: Secondary | ICD-10-CM

## 2016-10-08 MED ORDER — TORSEMIDE 20 MG PO TABS
20 MG | ORAL_TABLET | Freq: Every day | ORAL | 3 refills | Status: DC
Start: 2016-10-08 — End: 2016-11-27

## 2016-10-08 NOTE — Progress Notes (Signed)
[place] - Cardiology      Chief Complaint: "swelling"    Chief Complaint   Patient presents with   ??? Check-Up   ??? Congestive Heart Failure   ??? Coronary Artery Disease   ??? Hypertension   ??? Chest Pain     unchanged; sharp steady pain   ??? Shortness of Breath     when exerting self   ??? Edema     she can feel it in hands   ??? Fatigue   ??? Dizziness     some but not as much       History of present illness: Rachael Mays is a 60 y.o. female with past medical history significant for CAD/stent (03/13/16), dCHF, DVT/PE, s/p Greenfield filter, HTN, HLD, OSA on bipap, and obesity. She has been experiencing chronic chest pain and shortness of breath since April 2017. She underwent LHC (03/2016) that revealed significant stenosis of the LAD and a DES was placed. RHC was also performed which showed PA mean 33 mmHg, PCWP 18 mmHg (transpulmonary gradient 15), PVR 2.134 Wood units.  Symptoms have not improved post stent. She has seen Dr Jeb Levering and CTA/CTPA (03/2016) with no evidence of PE or acute abnormality. Echo 04/06/16 with concentric LVH, EF 65% and RVSP 42 mmHg. Repeat LHC (07/2016) for shortness of breath and chest pain demonstrated patent stent LAD. She was seen in ER 09/01/2016 after presenting with shortness of breath, chest heaviness and weight gain, workup was non-revealing.           Patient presents today for follow up dCHF. She continues to experience chronic chest discomfort to varying degrees and exertional shortness of breath. Main complaint seems to be fluid retention despite taking furosemide 80 mg BID. She c/o swelling in hands and bloating in abdomen. Denies orthopnea or PND but on bipap at night (since 12/2015). No palpitations or lightheadedness. Office weight today 299 lbs compared to 302 last visit.         [name] describes symptoms including chest discomfort, dyspnea, fatigue, edema but denies orthopnea, PND, syncope. Patient reports adherence with medication  regimen. She follows a no added salt diet and estimates she drinks about 24 ounces a day.         Past Medical History:   Diagnosis Date   ??? Anxiety    ??? Asthma    ??? Blood circulation, collateral    ??? CAD (coronary artery disease)    ??? CHF (congestive heart failure) ([place])    ??? Chronic back pain    ??? Deep vein thrombosis    ??? Depression    ??? GERD (gastroesophageal reflux disease)     NO LONGER SINCE LAP   ??? GERD (gastroesophageal reflux disease) 01/23/2009   ??? Hx of blood clots    ??? Hyperlipidemia     hx; resolved with lap band   ??? Hypertension    ??? Obesity     hx of; had lap band   ??? Obstructive sleep apnea (adult) (pediatric) 09/18/2015   ??? Pulmonary embolism [facility])      Past Surgical History:   Procedure Laterality Date   ??? BACK SURGERY      neck  plates   ??? BREAST SURGERY      left lumpectomy   ??? CESAREAN SECTION     ??? CHOLECYSTECTOMY     ??? COLONOSCOPY     ??? COLONOSCOPY  04/08/10   ??? CORONARY ANGIOPLASTY     ???  DIAGNOSTIC CARDIAC CATH LAB PROCEDURE     ??? DILATATION, ESOPHAGUS     ??? ENDOSCOPY, COLON, DIAGNOSTIC     ??? HYSTERECTOMY     ??? LAP BAND  05/01/08    Dr. Nicole Cella   ??? OTHER SURGICAL HISTORY      Greenfield Filter: curently in place   ??? OTHER SURGICAL HISTORY  07/05/13    lap band removal   ??? PARTIAL HYSTERECTOMY     ??? SHOULDER SURGERY     ??? UPPER GASTROINTESTINAL ENDOSCOPY  05/19/13   ??? UPPER GASTROINTESTINAL ENDOSCOPY  07/21/13    ESOPHAGEAL STENT PLACEMENT   ??? UPPER GASTROINTESTINAL ENDOSCOPY N/A 07/31/2014    Esophagogastroduodenoscopy with esophageal balloon dilation   ??? VASCULAR SURGERY  04/2015    Lucendia Herrlich, celiac artery angiogram, normal abd arteries     Social History   Substance Use Topics   ??? Smoking status: Former Smoker     Packs/day: 0.50     Years: 40.00     Types: Cigarettes     Quit date: 08/07/2012   ??? Smokeless tobacco: Former Systems developer     Quit date: 06/16/2013      Comment: started to smoke at age 43 / only smoked 0.5 p.p.d    ??? Alcohol use No     Review of Systems:   Constitutional: No significant change in  weight, + fatigue   HEENT: No change in vision or ringing in the ears.  Respiratory: + DOE, no PND, orthopnea or cough.   Cardiovascular: See HPI + chest pressure (chronic), + edema (hands/feet)  GI: No n/v, abdominal pain or changes in bowel habits.  No melena, no hematochezia + abdominal bloating  GU: No dysuria or hematuria.  Skin: No rash or new skin lesions.  Musculoskeletal: No new muscle or joint pain.  Neurological: No lightheadedness, dizziness, syncope or TIA-like symptoms.  Psychiatric: No anxiety, insomnia or depression    Physical Exam:  BP 136/72 (Site: Left Arm, Position: Sitting, Cuff Size: Large Adult)    Pulse 68    Resp 16    Ht '5\' 6"'  (1.676 m)    Wt 299 lb 12.8 oz (136 kg)    SpO2 95%    BMI 48.39 kg/m??     General:  Awake, alert, oriented in NAD  Skin:  Warm and dry.  No unusual bruising or rash  HEENT: Normocephalic and atraumatic. Oral mucosa moist, no cyanosis.  Neck:  Supple.  No JVP appreciated but difficult to assess  Chest:  Normal effort.  Clear to auscultation  Cardiovascular:  RRR, S1/S2, no murmur/gallop/rub  Abdomen:  Soft, nontender, +bowel sounds  Extremities:  1+ BLE edema mostly involving feet and ankles  Neurological: No focal deficits  Psychological: Normal mood and affect      Current Outpatient Prescriptions   Medication Sig Dispense Refill   ??? spironolactone (ALDACTONE) 25 MG tablet Take 1 tablet by mouth daily 30 tablet 0   ??? Handicap Placard MISC by Does not apply route DX: chronic diastolic heart failure, dyspnea on exertion    Duration: 1 year 1 each 0   ??? traZODone (DESYREL) 100 MG tablet TAKE ONE TABLET BY MOUTH NIGHTLY 90 tablet 1   ??? furosemide (LASIX) 40 MG tablet Take 80 mg in am and 40 mg in pm (Patient taking differently: 80 mg 2 times daily ) 270 tablet 3   ??? venlafaxine (EFFEXOR XR) 150 MG extended release capsule Take 1 capsule by mouth daily 90 capsule 3   ???  venlafaxine (EFFEXOR XR) 75 MG extended release capsule Take 2 capsules by mouth daily (Patient taking  differently: Take 75 mg by mouth daily ) 90 capsule 3   ??? fluocinonide (LIDEX) 0.05 % external solution Apply sparingly to the back of the scalp daily if needed for itching. 60 mL 1   ??? metoprolol (LOPRESSOR) 100 MG tablet Take 0.5 tablets by mouth 2 times daily 270 tablet 1   ??? ALPRAZolam (XANAX) 0.25 MG tablet Take 1 tablet by mouth 2 times daily as needed for Anxiety 60 tablet 0   ??? nystatin (MYCOSTATIN) 100000 UNIT/GM cream Apply topically 2 times daily. 30 g 2   ??? lisinopril (PRINIVIL;ZESTRIL) 40 MG tablet Take 1 tablet by mouth daily Indications: Patient thought this was dc'd in December by PCP (Patient taking differently: Take 40 mg by mouth daily ) 90 tablet 3   ??? omeprazole (PRILOSEC) 40 MG delayed release capsule Take 1 capsule by mouth daily 90 capsule 3   ??? oxyCODONE-acetaminophen (PERCOCET) 5-325 MG per tablet Take 1 tablet by mouth every 6 hours as needed for Pain . 40 tablet 0   ??? nitroGLYCERIN (NITROSTAT) 0.4 MG SL tablet up to max of 3 total doses. If no relief after 1 dose, call 911. 25 tablet 3   ??? aspirin 81 MG EC tablet Take 1 tablet by mouth daily 30 tablet 11   ??? prasugrel (EFFIENT) 10 MG TABS Take 1 tablet by mouth daily 90 tablet 3   ??? triamcinolone (KENALOG) 0.1 % cream Apply to affected areas twice daily for up to 2 weeks or until improved. 80 g 1   ??? montelukast (SINGULAIR) 10 MG tablet TAKE ONE TABLET BY MOUTH ONE TIME A DAY 90 tablet 1   ??? albuterol sulfate HFA (PROVENTIL HFA) 108 (90 BASE) MCG/ACT inhaler Inhale 2 puffs into the lungs every 6 hours as needed for Wheezing 3 Inhaler 3   ??? Blood Pressure Monitor KIT Use daily as needed 1 kit 0   ??? ondansetron (ZOFRAN) 4 MG tablet Take 1 tablet by mouth daily as needed for Nausea or Vomiting 30 tablet 1   ??? Multiple Vitamins-Minerals (MULTIVITAMIN PO) Take 1 tablet by mouth daily      ??? CALCIUM-VITAMIN D PO Take 1 tablet by mouth daily      ??? cetirizine (ZYRTEC ALLERGY) 10 MG tablet Take 10 mg by mouth daily.       No current  facility-administered medications for this visit.        Labs:   Lab Results   Component Value Date    WBC 7.6 09/01/2016    HGB 12.5 09/01/2016    HCT 37.9 09/01/2016    MCV 85.6 09/01/2016    PLT 184 09/01/2016     Lab Results   Component Value Date    NA 140 09/01/2016    K 4.2 09/01/2016    CL 100 09/01/2016    CO2 29 09/01/2016    BUN 15 09/01/2016    CREATININE 0.9 09/01/2016    GLUCOSE 130 09/01/2016    CALCIUM 9.4 09/01/2016      Lab Results   Component Value Date    TRIG 114 06/17/2016    HDL 43 06/17/2016    HDL 52 03/31/2011    LDLCALC 52 06/17/2016    LABVLDL 23 06/17/2016       Diagnostics:    Echo 04/06/16:  Summary   Normal left ventricle size and systolic function with an estimated ejection  fraction of 65%.   There is mild concentric left ventricular hypertrophy.   Mild mitral regurgitation is present.   The aortic valve appears sclerotic but opens well.   There is mild tricuspid regurgitation with RVSP estimated at 42 mmHg.   Mild pulmonic regurgitation present.   Definity was used to better delineate endocardium.    Cardiac Cath 07/17/16:  Anatomy:   LM-normal  LAD-widely patent stent otherwise normal  Cx-normal, dominant  OM1- normal  RCA-normal  LVEF- 70%, LVEDP 15 mmHg  ??  Impression:  1.  Widely patent LAD stent otherwise normal coronary arteries  2.  Hyperdynamic LV systolic function  3.  Non-cardiac chest pain     R/L Cardiac Cath 03/31/16:  Anatomy:   LM-normal   LAD-70% mid with FFR 0.77  Cx-normal, codominant  OM1- normal  RCA-normal  RPDA- normal  LVEF- 70%, LVEDP 19   PCI: LAD 70% to 0% with 3.25 mm x 18 mm Xience Alpine drug-eluting stent     Hemodynamics:  RA-15/12 mean 9  RV- 60/0, 9  PAWP-20/26 mean 19  PA- 60/12 mean 33     Assessment:  1. Chronic dCHF. NYHA class III. Appears compensated. On ACE, beta blocker and diuretics.    2.  Shortness of breath. Multifactorial: dCHF, COPD, obesity related hypoventilation and deconditioning.   3. Essential hypertension. Controlled. Goal under  age 59, < 140/90   4. CAD s/p DES 03/2016. Stable. On DAPTand beta blocker therapy. Intolerant of statins (myalgias)..        Plan:  1. Discontinue furosemide (Lasix).  2. Begin torsemide 40 mg daily.   3. Continue to monitor weight and notify office for weight gain 3 lbs overnight or 5 lbs in a week.   4. Other medications unchanged.  5. Check labs in one week after starting torsemide.   6. Follow up in January or earlier for worsening.     Thank you for allowing me to participate in the care of your patient.    Jacqualine Code, CNP        QUALITY MEASURES  1. Tobacco Cessation Counseling: N/A  2. BP retake if >140/90: N/A  3. Communication to PCP: Office note forwarded/faxed to PCP  4. CAD antiplatelet therapy: no (Intolerant)  5. CAD lipid lowering therapy: yes  6. HF A. Fib on anticoagulation: N/A

## 2016-10-08 NOTE — Patient Instructions (Signed)
1. Discontinue furosemide (Lasix).    2. Begin torsemide 40 mg daily.     3. Continue to monitor weight and notify office for weight gain 3 lbs overnight or 5 lbs in a week.     4. Other medications unchanged.    5. Check labs in one week after starting torsemide.     6. Follow up in January or earlier for worsening.

## 2016-10-14 NOTE — Telephone Encounter (Signed)
Patient seen in office last week and switched to torsemide. Please call patient and see how it is working for her, what is her weight, how is the swelling. Remind her of need for lab recheck one week after starting torsemide. There is an order in Epic. Thanks.

## 2016-10-14 NOTE — Telephone Encounter (Signed)
LVM for patient to call our office.

## 2016-10-15 NOTE — Telephone Encounter (Signed)
Attempted to contact patient again, had to LVM for her to call our office.

## 2016-10-16 NOTE — Care Coordination-Inpatient (Signed)
[place] spoke with patient via telephone and she is agreeable to participate in care coordination program.   [facility] reviewed that the patient is supposed to have lab work one week after starting torsemide. Patient reports that she just received it in the mail yesterday and will start taking it tomorrow. She said she will go to [facility] lab on Monday 11/20 for blood work.     Patient mentioned that he has been having stabbing chest pain for the past 3 days. She said she had relief after 3 nitro tabs. Patient reports she has experienced this pain in the past. Patient considered going to the ER for this, but when she has gone to ER for chest pain in the past, she doesn't feel like anyone believes her or they do anything to help. RNCC instructed patient to hang up and call cardiology office directly for instruction on how to address chest pain. Patient agreeable .

## 2016-10-16 NOTE — Telephone Encounter (Signed)
Contacted the patient and informed her of [facility]'s comments on CP.  Advised of weight parameters/when to call office if gains.  Also reminded her that labs will need to be checked 1 week after starting the torsemide (next week).  She verbalized understanding

## 2016-10-16 NOTE — Telephone Encounter (Signed)
spoke to the pt. She just received the Torsemide in the mail today. Weight, and swelling are good, but she she has had stabbing pain/ache in her chest, for the last three days. Goes a way with Nitro. Not short of breath.

## 2016-10-16 NOTE — Telephone Encounter (Signed)
Chest pain is likely secondary to fluid overload and hopefully will improve with torsemide. LHC 07/2016 showed patent stent. Ask her to weigh daily and call for weight gain 3 lbs overnight or 5 lbs in a week, may need to adjust torsemide dose depending on response. Remind her of need to have labs checked one week after starting torsemide. Thank you.

## 2016-10-16 NOTE — Care Coordination-Inpatient (Signed)
RNCC placed follow up to patient. Rachael Mays spoke with cardiology regarding her chest pain and they think it is related to fluid overload.     Patient discussed how frustrating and scary her chest pain is. Her mother died suddenly of a massive heart attack, and she states she is worried she is going to die of a heart attack also.     Patient reports that she has all her medications and she takes them as prescribed. Patient weighs self daily and keep a log of her weights. Patient has been trying to lose weight by reducing her carbs and exercising.     RNCC discussed S/S of CHF exacerbation and when to notify provider. RNCC discussed following a low sodium diet to avoid fluid retention. RNCC discussed the CHF resource line and mailed patient CHF intro, CHF and a low sodium diet handout, and CHF ZONE management handout. Patient declines attending the CHF education class at this time, but may attend once the holidays are over.

## 2016-10-19 NOTE — Telephone Encounter (Signed)
I agree with the Care Coordinator's Plan of Care

## 2016-10-28 ENCOUNTER — Encounter

## 2016-10-28 LAB — BASIC METABOLIC PANEL
Anion Gap: 14 (ref 3–16)
BUN: 15 mg/dL (ref 7–20)
CO2: 28 mmol/L (ref 21–32)
Calcium: 9.7 mg/dL (ref 8.3–10.6)
Chloride: 103 mmol/L (ref 99–110)
Creatinine: 0.9 mg/dL (ref 0.6–1.1)
GFR African American: >60 (ref >60)
GFR Non-African American: >60 (ref >60)
Glucose: 95 mg/dL (ref 70–99)
Potassium: 4 mmol/L (ref 3.5–5.1)
Sodium: 145 mmol/L (ref 136–145)

## 2016-10-28 NOTE — Care Coordination-Inpatient (Signed)
RNCC placed call to patient. Message left advising patient to monitor sodium intake over Thanksgiving and to call PCP or cardiology if she see weight increase.

## 2016-10-28 NOTE — Telephone Encounter (Signed)
Notes Recorded by Dorian Furnace, CNP on 10/28/2016 at 8:43 AM EST  Notify patient kidney numbers are stable. How is the swelling with the torsemide? Need to recheck labs again in 3 weeks for stability. Orders placed. Thanks.      I contacted the patient and relayed results to her.  She will get labs rechecked in 3 weeks.  States she loves the torsemide.  She doesn't have much swelling at all now.

## 2016-10-30 NOTE — Care Coordination-Inpatient (Signed)
RNCC placed call to patient- no answer. Message left and call back requested.

## 2016-11-03 ENCOUNTER — Ambulatory Visit
Admit: 2016-11-03 | Discharge: 2016-11-03 | Payer: PRIVATE HEALTH INSURANCE | Attending: Family Medicine | Primary: Sports Medicine

## 2016-11-03 DIAGNOSIS — S91301A Unspecified open wound, right foot, initial encounter: Secondary | ICD-10-CM

## 2016-11-03 NOTE — Progress Notes (Signed)
Rachael Mays  DOB: 05-21-1956  Encounter date: 11/03/2016    This is a 60 y.o. female who presents with  Chief Complaint   Patient presents with   ??? Laceration     Patient was cutting a calus off the bottom of her right with a razor blade.   Also, would like a flu shot and discuss getting a pnuemoina vaccine.       History of present illness:    Cutting off callous on right foot; went too deep with razor. Foot has been oozing since last evening. Finally has slowed down. Has some tenderness in area. States foot and razor were sterilized. This is something she does regularly; hasn't cut self before. Last tetanus was 2012.     States that she was told due to heart failure issues that she should get flu/pneumonia shots.      Allergies   Allergen Reactions   ??? Morphine Itching   ??? Seasonal    ??? Talwin [Pentazocine] Other (See Comments)     dizzy     Outpatient Prescriptions Marked as Taking for the 11/03/16 encounter (Office Visit) with Caren Macadam, MD   Medication Sig Dispense Refill   ??? torsemide (DEMADEX) 20 MG tablet Take 2 tablets by mouth daily 30 tablet 3   ??? spironolactone (ALDACTONE) 25 MG tablet Take 1 tablet by mouth daily 30 tablet 0   ??? Handicap Placard MISC by Does not apply route DX: chronic diastolic heart failure, dyspnea on exertion    Duration: 1 year 1 each 0   ??? traZODone (DESYREL) 100 MG tablet TAKE ONE TABLET BY MOUTH NIGHTLY 90 tablet 1   ??? venlafaxine (EFFEXOR XR) 150 MG extended release capsule Take 1 capsule by mouth daily 90 capsule 3   ??? venlafaxine (EFFEXOR XR) 75 MG extended release capsule Take 2 capsules by mouth daily (Patient taking differently: Take 75 mg by mouth daily ) 90 capsule 3   ??? fluocinonide (LIDEX) 0.05 % external solution Apply sparingly to the back of the scalp daily if needed for itching. 60 mL 1   ??? metoprolol (LOPRESSOR) 100 MG tablet Take 0.5 tablets by mouth 2 times daily 270 tablet 1   ??? ALPRAZolam (XANAX) 0.25 MG tablet Take 1 tablet by mouth 2 times daily  as needed for Anxiety 60 tablet 0   ??? nystatin (MYCOSTATIN) 100000 UNIT/GM cream Apply topically 2 times daily. 30 g 2   ??? lisinopril (PRINIVIL;ZESTRIL) 40 MG tablet Take 1 tablet by mouth daily Indications: Patient thought this was dc'd in December by PCP (Patient taking differently: Take 40 mg by mouth daily ) 90 tablet 3   ??? omeprazole (PRILOSEC) 40 MG delayed release capsule Take 1 capsule by mouth daily 90 capsule 3   ??? oxyCODONE-acetaminophen (PERCOCET) 5-325 MG per tablet Take 1 tablet by mouth every 6 hours as needed for Pain . 40 tablet 0   ??? nitroGLYCERIN (NITROSTAT) 0.4 MG SL tablet up to max of 3 total doses. If no relief after 1 dose, call 911. 25 tablet 3   ??? aspirin 81 MG EC tablet Take 1 tablet by mouth daily 30 tablet 11   ??? prasugrel (EFFIENT) 10 MG TABS Take 1 tablet by mouth daily 90 tablet 3   ??? triamcinolone (KENALOG) 0.1 % cream Apply to affected areas twice daily for up to 2 weeks or until improved. 80 g 1   ??? montelukast (SINGULAIR) 10 MG tablet TAKE ONE TABLET BY MOUTH ONE  TIME A DAY 90 tablet 1   ??? albuterol sulfate HFA (PROVENTIL HFA) 108 (90 BASE) MCG/ACT inhaler Inhale 2 puffs into the lungs every 6 hours as needed for Wheezing 3 Inhaler 3   ??? Blood Pressure Monitor KIT Use daily as needed 1 kit 0   ??? ondansetron (ZOFRAN) 4 MG tablet Take 1 tablet by mouth daily as needed for Nausea or Vomiting 30 tablet 1   ??? Multiple Vitamins-Minerals (MULTIVITAMIN PO) Take 1 tablet by mouth daily      ??? CALCIUM-VITAMIN D PO Take 1 tablet by mouth daily      ??? cetirizine (ZYRTEC ALLERGY) 10 MG tablet Take 10 mg by mouth daily.         Review of Systems   Constitutional: Negative for chills and fever.   Skin: Positive for wound (see above).       Objective:    BP 130/80    Pulse 64    Wt 287 lb (130.2 kg)    SpO2 98%    BMI 46.32 kg/m??   Weight: 287 lb (130.2 kg)     BP Readings from Last 3 Encounters:   11/03/16 130/80   10/08/16 136/72   09/02/16 100/60     Wt Readings from Last 3 Encounters:    11/03/16 287 lb (130.2 kg)   10/08/16 299 lb 12.8 oz (136 kg)   09/02/16 (!) 302 lb 12.8 oz (137.3 kg)       Physical Exam   Constitutional: She appears well-developed and well-nourished. No distress.   Skin:   Ball of right foot there is circular 0.25cm erythematous, non-bleeding wound. Wound bed appears healthy. No surrounding edema or erythema. Light tenderness to palpation.         Assessment/Plan    1. Unspecified open wound, right foot, initial encounter  Wound is not actively bleeding. Recommended staying off feet as much as possible over next 48 hours. Consider corn pads/gauze on foot to limit pressure on wound. Use triple antibiotic ointment. If oozing again can return to office for chemical cautery. Let us know if any worsening in wound.    2. Need for influenza vaccination  completed    3. Need for pneumococcal vaccination  completed      Return if symptoms worsen or fail to improve.

## 2016-11-10 ENCOUNTER — Encounter: Attending: Family | Primary: Sports Medicine

## 2016-11-10 NOTE — Care Coordination-Inpatient (Signed)
Ambulatory Care Coordination Note  11/10/2016  CM Risk Score: 11  Gagne Mortality Risk Score:      ACC: Roderic Scarce, RN    Summary Note: [place] placed follow up to patient. She reports she is doing well. Patient continues to weigh herself daily. She denies SOB. She does not have swelling in her ankles and feet but reports that her calves feel "tight". Patient continues to weigh herself daily.  Patient reported her last seven weights:  11/29: 289  11/30: 287.6  12/1: 288.6  12/2: 286.6  12/3: 286  12/4 287.6  12/5 (today) 289.    Patient reviewed when to notify PCP for weight gain/ other CHF exacerbation S/S. Patient verbalized understanding. Patient has been compliant with her medications. She is taking her spironolactone and torsemide as ordered. Patient states she "loves" the torsemide compared to lasix. She said it does not cause the same urinary urgency that the lasix did and she thinks that the torsemide removes more fluid than the lasix did. Patient continues to monitor her sodium intake.     Patient continues to experience intermittent chest pain. She reports this the same pain she has been experiencing for years. She take PRN nitro which offers "sligh relief." RNCC instructed patient to notify MD if the pain feels worse or different.     Patient is worried about her brother who recently had a carotid endarterectomy d/t 95% occulusion in his carotid artery. Patient wonders if she should have carotid dopplers done. RNCC told patient to discuss this with PCP and cardiology at next visit. Patient agreeable.     Patient denies any needs at this time.               Care Coordination Interventions    Program Enrollment:  Complex Care  Referral from Primary Care Provider:  No  Suggested Interventions and Community Resources  Zone Management Tools:  In Process  Other Services or Interventions:  declined CHF class at this time.          Goals Addressed     None          Prior to Admission medications    Medication Sig  Start Date End Date Taking? Authorizing Provider   torsemide (DEMADEX) 20 MG tablet Take 2 tablets by mouth daily 10/08/16   Dorian Furnace, CNP   spironolactone (ALDACTONE) 25 MG tablet Take 1 tablet by mouth daily 09/28/16   Ernesto Rutherford, NP   Handicap Placard MISC by Does not apply route DX: chronic diastolic heart failure, dyspnea on exertion    Duration: 1 year 08/28/16   Dorian Furnace, CNP   traZODone (DESYREL) 100 MG tablet TAKE ONE TABLET BY MOUTH NIGHTLY 08/03/16   Harvel Quale, MD   venlafaxine (EFFEXOR XR) 150 MG extended release capsule Take 1 capsule by mouth daily 07/27/16   Harvel Quale, MD   venlafaxine (EFFEXOR XR) 75 MG extended release capsule Take 2 capsules by mouth daily  Patient taking differently: Take 75 mg by mouth daily  07/27/16   Harvel Quale, MD   fluocinonide (LIDEX) 0.05 % external solution Apply sparingly to the back of the scalp daily if needed for itching. 07/23/16   Francisca December, MD   metoprolol (LOPRESSOR) 100 MG tablet Take 0.5 tablets by mouth 2 times daily 07/17/16   Rana Anders Grant, MD   ALPRAZolam Duanne Moron) 0.25 MG tablet Take 1 tablet by mouth 2 times daily as  needed for Anxiety 06/22/16   Harvel Quale, MD   nystatin (MYCOSTATIN) 100000 UNIT/GM cream Apply topically 2 times daily. 05/05/16   Francisca December, MD   lisinopril (PRINIVIL;ZESTRIL) 40 MG tablet Take 1 tablet by mouth daily Indications: Patient thought this was dc'd in December by PCP  Patient taking differently: Take 40 mg by mouth daily  04/09/16   Harvel Quale, MD   omeprazole [facility]) 40 MG delayed release capsule Take 1 capsule by mouth daily 04/09/16   Harvel Quale, MD   oxyCODONE-acetaminophen (PERCOCET) 5-325 MG per tablet Take 1 tablet by mouth every 6 hours as needed for Pain . 04/07/16   Bhumit Patel, MD   nitroGLYCERIN (NITROSTAT) 0.4 MG SL tablet up to max of 3 total doses. If no relief after 1 dose, call 911. 04/06/16   Trena Platt, MD   aspirin 81 MG EC tablet Take 1 tablet by mouth daily 04/02/16   Leodis Binet, MD   prasugrel (EFFIENT) 10 MG TABS Take 1 tablet by mouth daily 04/02/16   Leodis Binet, MD   triamcinolone (KENALOG) 0.1 % cream Apply to affected areas twice daily for up to 2 weeks or until improved. 03/23/16   Francisca December, MD   montelukast (SINGULAIR) 10 MG tablet TAKE ONE TABLET BY MOUTH ONE TIME A DAY 03/18/16   Jamal Collin, MD   albuterol sulfate HFA (PROVENTIL HFA) 108 (90 BASE) MCG/ACT inhaler Inhale 2 puffs into the lungs every 6 hours as needed for Wheezing 12/27/15   Harvel Quale, MD   Blood Pressure Monitor KIT Use daily as needed 12/20/15   Harvel Quale, MD   ondansetron [facility]) 4 MG tablet Take 1 tablet by mouth daily as needed for Nausea or Vomiting 06/03/15   Harvel Quale, MD   Multiple Vitamins-Minerals (MULTIVITAMIN PO) Take 1 tablet by mouth daily     Historical Provider, MD   CALCIUM-VITAMIN D PO Take 1 tablet by mouth daily     Historical Provider, MD   cetirizine (ZYRTEC ALLERGY) 10 MG tablet Take 10 mg by mouth daily.    Historical Provider, MD       Future Appointments  Date Time Provider [place]   12/25/2016 3:30 PM Leodis Binet, MD [facility]   12/28/2016 2:00 PM Francisca December, MD [facility]   02/08/2017 1:20 PM Tiffanie Rita Ohara, CNP [facility] SLEEP [facility]     Congestive Heart Failure Assessment    Are you currently restricting fluids?:  2000cc  Do you understand a low sodium diet?:  Yes  Do you understand how to read food labels?:  Yes  Do you salt your food before tasting it?:  No         Symptoms:   CHF associated leg swelling: Pos      Symptom course:  stable  Weight trend:  fluctuating minimally  Salt intake watch compared to last visit:  stable      and

## 2016-11-12 NOTE — Telephone Encounter (Signed)
I agree with the Care Coordinator's Plan of Care

## 2016-11-19 NOTE — Telephone Encounter (Signed)
Instruct her she can take an extra 1/2 tab torsemide this afternoon. Remind her she needs to have labs checked. Thanks.

## 2016-11-19 NOTE — Telephone Encounter (Signed)
Relayed information to patient. She will come in early tomorrow am to have labs done

## 2016-11-19 NOTE — Telephone Encounter (Signed)
I contacted the patient and gave her instructions from [facility].  She will take the extra 1/2 tablet of torsemide today.  She stated she is due to get her labs done on the 18th of this month - I checked with [facility] and she said that will be fine.

## 2016-11-19 NOTE — Telephone Encounter (Signed)
Pt called to adv 3# weight gain overnight.  Please call to adv.    Thank you CSF

## 2016-11-24 LAB — BASIC METABOLIC PANEL
Anion Gap: 12 (ref 3–16)
BUN: 16 mg/dL (ref 7–20)
CO2: 30 mmol/L (ref 21–32)
Calcium: 9.4 mg/dL (ref 8.3–10.6)
Chloride: 104 mmol/L (ref 99–110)
Creatinine: 0.8 mg/dL (ref 0.6–1.1)
GFR African American: >60 (ref >60)
GFR Non-African American: >60 (ref >60)
Glucose: 112 mg/dL — ABNORMAL HIGH (ref 70–99)
Potassium: 3.8 mmol/L (ref 3.5–5.1)
Sodium: 146 mmol/L — ABNORMAL HIGH (ref 136–145)

## 2016-11-25 NOTE — Telephone Encounter (Signed)
Spoke to the pt. She has been up and down all week. Takes an extra Furosemide, the weight goes down, but then it returns. Has been feeling run, tired.

## 2016-11-25 NOTE — Telephone Encounter (Signed)
Pt calling to adv that she gained 3.4lbs over night, pt adv she is having trouble breathing and feels real anxious. Please call to adv thank you

## 2016-11-25 NOTE — Telephone Encounter (Signed)
Notes Recorded by Dorian Furnace, CNP on 11/24/2016 at 5:33 PM EST  Notify patient labs are stable. Continue current medications. Thanks.    LVM with results for patient.  Instructed to call our office if any questions.

## 2016-11-26 NOTE — Telephone Encounter (Signed)
Called patient and relayed information.

## 2016-11-26 NOTE — Telephone Encounter (Signed)
Clarify how much and what diuretic she is taking?

## 2016-11-26 NOTE — Telephone Encounter (Signed)
LM for patient to call our office for clarification

## 2016-11-26 NOTE — Telephone Encounter (Signed)
Ask her to increase torsemide to 40 mg daily for the next 4 days and see if that helps

## 2016-11-26 NOTE — Telephone Encounter (Signed)
Torsemide 20 mg 1 tablet daily per pt.  Pt says she is still SOB and feels swollen in the back of her legs and stomach along with her hands

## 2016-11-27 MED ORDER — TORSEMIDE 20 MG PO TABS
20 | ORAL_TABLET | Freq: Every day | ORAL | 0 refills | Status: DC
Start: 2016-11-27 — End: 2017-01-11

## 2016-11-27 NOTE — Care Coordination-Inpatient (Signed)
RNCC placed follow up call to patient to see where she would like 7 day supply of torsemide sent.

## 2016-11-27 NOTE — Care Coordination-Inpatient (Signed)
Ambulatory Care Coordination Note  11/27/2016  CM Risk Score: 11  Gagne Mortality Risk Score:      ACC: Roderic Scarce, RN    Summary Note: [place] placed follow up call to patient. She is currently taking 28m torsemide x4 days d/t fluid retention per cardiology. Patient reported that her weight on 12/21 was 292.2 when she started the increased dose of torsemide. After the first day of the increased dose, her weight was down to 290.4 pounds. Patient reports the abd and BLE edema is starting to improve. RNCC reviewed that is the patient doe not continue to see improvement with her weight that she needs to notify cardiology or PCP office. Patient reported that she will be out of her torsemide pills after her 12/24 dose and that she will not receive more from her mail order pharmacy for 7-10 days. RNCC clarified that the patient will be completely out of her diuretic pills- patient said yes. Patient wondered if she could take her left over lasix in the interim. [place] L[place] [facility]     RNCC reviewed the importance of monitoring fluid and sodium intake. Patient verbalized understanding. Patient denies any other needs at this time.         Care Coordination Interventions    Program Enrollment:  Complex Care  Referral from Primary Care Provider:  No  Suggested Interventions and Community Resources  Zone Management Tools:  In Process  Other Services or Interventions:  declined CHF class at this time.          Goals Addressed     None          Prior to Admission medications    Medication Sig Start Date End Date Taking? Authorizing Provider   torsemide (DEMADEX) 20 MG tablet Take 2 tablets by mouth daily 10/08/16   KDorian Furnace CNP   spironolactone (ALDACTONE) 25 MG tablet Take 1 tablet by mouth daily 09/28/16   RErnesto Rutherford NP   Handicap Placard MISC by Does not apply route DX: chronic diastolic heart failure, dyspnea on  exertion    Duration: 1 year 08/28/16   KDorian Furnace CNP   traZODone (DESYREL) 100 MG tablet TAKE ONE TABLET BY MOUTH NIGHTLY 08/03/16   THarvel Quale MD   venlafaxine (EFFEXOR XR) 150 MG extended release capsule Take 1 capsule by mouth daily 07/27/16   THarvel Quale MD   venlafaxine (EFFEXOR XR) 75 MG extended release capsule Take 2 capsules by mouth daily  Patient taking differently: Take 75 mg by mouth daily  07/27/16   THarvel Quale MD   fluocinonide (LIDEX) 0.05 % external solution Apply sparingly to the back of the scalp daily if needed for itching. 07/23/16   MFrancisca December MD   metoprolol (LOPRESSOR) 100 MG tablet Take 0.5 tablets by mouth 2 times daily 07/17/16   Rana KAnders Grant MD   ALPRAZolam (Duanne Moron 0.25 MG tablet Take 1 tablet by mouth 2 times daily as needed for Anxiety 06/22/16   THarvel Quale MD   nystatin (MYCOSTATIN) 100000 UNIT/GM cream Apply topically 2 times daily. 05/05/16   MFrancisca December MD   lisinopril (PRINIVIL;ZESTRIL) 40 MG tablet Take 1 tablet by mouth daily Indications: Patient thought this was dc'd in December by PCP  Patient taking differently: Take 40 mg by mouth daily  04/09/16   THarvel Quale MD  omeprazole (PRILOSEC) 40 MG delayed release capsule Take 1 capsule by mouth daily 04/09/16   Harvel Quale, MD   oxyCODONE-acetaminophen (PERCOCET) 5-325 MG per tablet Take 1 tablet by mouth every 6 hours as needed for Pain . 04/07/16   Bhumit Patel, MD   nitroGLYCERIN (NITROSTAT) 0.4 MG SL tablet up to max of 3 total doses. If no relief after 1 dose, call 911. 04/06/16   Trena Platt, MD   aspirin 81 MG EC tablet Take 1 tablet by mouth daily 04/02/16   Leodis Binet, MD   prasugrel (EFFIENT) 10 MG TABS Take 1 tablet by mouth daily 04/02/16   Leodis Binet, MD   triamcinolone (KENALOG) 0.1 % cream Apply to affected areas twice daily for up to 2 weeks or until improved. 03/23/16   Francisca December, MD   montelukast  (SINGULAIR) 10 MG tablet TAKE ONE TABLET BY MOUTH ONE TIME A DAY 03/18/16   Jamal Collin, MD   albuterol sulfate HFA (PROVENTIL HFA) 108 (90 BASE) MCG/ACT inhaler Inhale 2 puffs into the lungs every 6 hours as needed for Wheezing 12/27/15   Harvel Quale, MD   Blood Pressure Monitor KIT Use daily as needed 12/20/15   Harvel Quale, MD   ondansetron [facility]) 4 MG tablet Take 1 tablet by mouth daily as needed for Nausea or Vomiting 06/03/15   Harvel Quale, MD   Multiple Vitamins-Minerals (MULTIVITAMIN PO) Take 1 tablet by mouth daily     Historical Provider, MD   CALCIUM-VITAMIN D PO Take 1 tablet by mouth daily     Historical Provider, MD   cetirizine (ZYRTEC ALLERGY) 10 MG tablet Take 10 mg by mouth daily.    Historical Provider, MD       Future Appointments  Date Time Provider [place]   12/25/2016 3:30 PM Leodis Binet, MD [facility] Card[facility]   12/28/2016 2:00 PM Francisca December, MD [facility]   02/08/2017 1:20 PM Tiffanie Rita Ohara, CNP [facility] SLEEP [facility]     Congestive Heart Failure Assessment    Are you currently restricting fluids?:  2000cc  Do you understand a low sodium diet?:  Yes  Do you understand how to read food labels?:  Yes  Do you salt your food before tasting it?:  No     Increase in weights (more than 3lbs overnight or 5lbs in one week), Shortness of breath (worse than baseline), Swelling (worse than baseline) in hands, feet/legs or around abdomen (Comment: Cardiology aware and titrated torsemide)      Symptoms:   CHF associated leg swelling: Pos, CHF associated shortness of breath: Pos      Symptom course:  improving  Patient-reported weight (lb):  290.4  Weight trend:  increasing steadily  Salt intake watch compared to last visit:  worse      and

## 2016-11-27 NOTE — Telephone Encounter (Signed)
Roderic Scarce, Care Coordinator at [facility] received a call from patient:  Per Vicente Males:   She just called and told me she will be out of her torsemide on 12/24 and that her mail refill order won't come in until several days after that. Can she have an interim prescription sent to a local pharmacy so that she doesn't run out? [facility] in [place].    Taking torsemide 20 mg tablets, 2 po daily.    LOV 10/08/16  BMP 11/24/16

## 2016-12-02 NOTE — Telephone Encounter (Signed)
 Pt calling states she has a rash on the left side of her leg it is itching want to know if she can get something for it pls call pt back to discuss @ [phone] thank you

## 2016-12-02 NOTE — Care Coordination-Inpatient (Signed)
RNCC placed call to both and mobile numbers- no answer. Message left at home and call back requested.

## 2016-12-03 NOTE — Telephone Encounter (Signed)
I agree with the Care Coordinator's Plan of Care

## 2016-12-03 NOTE — Telephone Encounter (Signed)
Called pt unable to leave a message, voicemail is not set up. If pt calls please offer apt for 12/08/16 at 8:30, Dr. [name] will need to see the rash.

## 2016-12-11 NOTE — Telephone Encounter (Signed)
Pt scheduled for 12/14/16

## 2016-12-14 ENCOUNTER — Ambulatory Visit
Admit: 2016-12-14 | Discharge: 2016-12-14 | Payer: PRIVATE HEALTH INSURANCE | Attending: Dermatology | Primary: Sports Medicine

## 2016-12-14 DIAGNOSIS — B372 Candidiasis of skin and nail: Secondary | ICD-10-CM

## 2016-12-14 MED ORDER — CLOTRIMAZOLE 1 % EX CREA
1 % | CUTANEOUS | 2 refills | Status: AC
Start: 2016-12-14 — End: 2016-12-21

## 2016-12-14 NOTE — Care Coordination-Inpatient (Signed)
Ambulatory Care Coordination Note  12/14/2016  CM Risk Score: 11  Gagne Mortality Risk Score:      ACC: Roderic Scarce, RN    Summary Note: [place] placed call to patient. Patient reports that she experiencing swelling in her hands and abdominal fullness. She also feels more SOB than usual. Her current weight is 290.8. Her previous weights are listed below:  1/3 292.6  1/4 291.2  1/5 291.1  1/6 291.2  1/7 290.8    Patient reports that she took 2 torsemide pills today. She reports she normally only takes one pill a day. RNCC reviewed that the patient's medication order states 2 52m pills every day.     RNCC instructed patient to contact cardiology in the AM if she still feels the abd fullness and SOB. Patient agreeable.            Care Coordination Interventions    Program Enrollment:  Complex Care  Referral from Primary Care Provider:  No  Suggested Interventions and Community Resources  Zone Management Tools:  In Process  Other Services or Interventions:  declined CHF class at this time.          Goals Addressed             Most Recent       Patient Stated    ??? Patient Stated (pt-stated)   On track (12/13/2016)             Patient wants to walk three times a week.    Barriers: Leg stiffness  Plan for overcoming my barriers: N/A  Confidence: 10/10  Anticipated Goal Completion Date: 07/27/16         Other    ??? Self Monitoring   On track (12/13/2016)             Daily Weights - I will notify my provider of any increase in weight by 3 or more pounds in 2 days OR 5 or more pounds in a week.    None Recently Recorded    Barriers: none  Plan for overcoming my barriers: N/A  Confidence: 10/10  Anticipated Goal Completion Date: 3 months                Prior to Admission medications    Medication Sig Start Date End Date Taking? Authorizing Provider   torsemide (DEMADEX) 20 MG tablet Take 2 tablets by mouth daily 11/27/16   RErnesto Rutherford NP   spironolactone (ALDACTONE) 25 MG tablet Take 1 tablet by mouth daily 09/28/16   RErnesto Rutherford NP   Handicap Placard MISC by Does not apply route DX: chronic diastolic heart failure, dyspnea on exertion    Duration: 1 year 08/28/16   KDorian Furnace CNP   traZODone (DESYREL) 100 MG tablet TAKE ONE TABLET BY MOUTH NIGHTLY 08/03/16   THarvel Quale MD   venlafaxine (EFFEXOR XR) 150 MG extended release capsule Take 1 capsule by mouth daily 07/27/16   THarvel Quale MD   venlafaxine (EFFEXOR XR) 75 MG extended release capsule Take 2 capsules by mouth daily  Patient taking differently: Take 75 mg by mouth daily  07/27/16   THarvel Quale MD   fluocinonide (LIDEX) 0.05 % external solution Apply sparingly to the back of the scalp daily if needed for itching. 07/23/16   MFrancisca December MD   metoprolol (LOPRESSOR) 100 MG tablet Take 0.5 tablets by mouth 2 times daily 07/17/16   Rana  Anders Grant, MD   ALPRAZolam Duanne Moron) 0.25 MG tablet Take 1 tablet by mouth 2 times daily as needed for Anxiety 06/22/16   Harvel Quale, MD   nystatin (MYCOSTATIN) 100000 UNIT/GM cream Apply topically 2 times daily. 05/05/16   Francisca December, MD   lisinopril (PRINIVIL;ZESTRIL) 40 MG tablet Take 1 tablet by mouth daily Indications: Patient thought this was dc'd in December by PCP  Patient taking differently: Take 40 mg by mouth daily  04/09/16   Harvel Quale, MD   omeprazole [facility]) 40 MG delayed release capsule Take 1 capsule by mouth daily 04/09/16   Harvel Quale, MD   oxyCODONE-acetaminophen (PERCOCET) 5-325 MG per tablet Take 1 tablet by mouth every 6 hours as needed for Pain . 04/07/16   Bhumit Patel, MD   nitroGLYCERIN (NITROSTAT) 0.4 MG SL tablet up to max of 3 total doses. If no relief after 1 dose, call 911. 04/06/16   Trena Platt, MD   aspirin 81 MG EC tablet Take 1 tablet by mouth daily 04/02/16   Leodis Binet, MD   prasugrel (EFFIENT) 10 MG TABS Take 1 tablet by mouth daily 04/02/16   Leodis Binet, MD   triamcinolone (KENALOG) 0.1 % cream Apply to affected  areas twice daily for up to 2 weeks or until improved. 03/23/16   Francisca December, MD   montelukast (SINGULAIR) 10 MG tablet TAKE ONE TABLET BY MOUTH ONE TIME A DAY 03/18/16   Jamal Collin, MD   albuterol sulfate HFA (PROVENTIL HFA) 108 (90 BASE) MCG/ACT inhaler Inhale 2 puffs into the lungs every 6 hours as needed for Wheezing 12/27/15   Harvel Quale, MD   Blood Pressure Monitor KIT Use daily as needed 12/20/15   Harvel Quale, MD   ondansetron [facility]) 4 MG tablet Take 1 tablet by mouth daily as needed for Nausea or Vomiting 06/03/15   Harvel Quale, MD   Multiple Vitamins-Minerals (MULTIVITAMIN PO) Take 1 tablet by mouth daily     Historical Provider, MD   CALCIUM-VITAMIN D PO Take 1 tablet by mouth daily     Historical Provider, MD   cetirizine (ZYRTEC ALLERGY) 10 MG tablet Take 10 mg by mouth daily.    Historical Provider, MD       Future Appointments  Date Time Provider [place]   12/14/2016 3:00 PM Francisca December, MD [facility]   12/25/2016 3:30 PM Leodis Binet, MD [facility]   12/28/2016 2:00 PM Francisca December, MD [facility]   02/08/2017 1:20 PM Tiffanie Rita Ohara, CNP [facility] SLEEP [facility]     General Assessment    Do you have any symptoms that are causing concern?:  No      and   Congestive Heart Failure Assessment    Are you currently restricting fluids?:  2000cc  Do you understand a low sodium diet?:  Yes  Do you understand how to read food labels?:  Yes  Do you salt your food before tasting it?:  No         Symptoms:      Symptom course:  stable  Patient-reported weight (lb):  290.8  Weight trend:  stable  Salt intake watch compared to last visit:  stable     ,

## 2016-12-14 NOTE — Telephone Encounter (Signed)
LMOM for Rachael Mays per RX :  Sig: Take 2 tablets by mouth daily. I advised her to call back with questions or concerns.

## 2016-12-14 NOTE — Telephone Encounter (Signed)
Patient calling, spoke with Roderic Scarce who advised she make an appointment for a UTI.  Patient is c/o pelvic pain, urgency with little output, a lot of pain and pressure.  Patient has an appointment with Dr. Jacquelyne Balint at 3 and is unable to make an appointment after 3 today since she will be out in [facility] please advise.  She would prefer not to wait until tomorrow due to the pain.

## 2016-12-14 NOTE — Care Coordination-Inpatient (Signed)
RNCC placed call to cardiology to clarify patient's torsemide dose. Patient reports that she only takes one 20mg  torsemide daily and the order reads 2 20mg  pills daily. Awaiting call back from provider.

## 2016-12-14 NOTE — Telephone Encounter (Signed)
I agree with the Care Coordinator's Plan of Care

## 2016-12-14 NOTE — Telephone Encounter (Signed)
Nurse at PCP office needs to clarify order for this patients torsemide . Please call nurse Vicente Males at [facility] office

## 2016-12-14 NOTE — Telephone Encounter (Signed)
Yes, sorry.  Need sample for u/a and culture.

## 2016-12-14 NOTE — Telephone Encounter (Signed)
Patient has an appointment tomorrow.

## 2016-12-14 NOTE — Care Coordination-Inpatient (Signed)
RNCC placed follow up call to patient. Patient's weight is down to 289.6 and she reports the SOB and swelling has improved.    The patient is complaining of issues urinating and feeling like she can't empty her bladder. RNCC instructed patient to call office to make acute visit- patient agreeable.

## 2016-12-14 NOTE — Telephone Encounter (Signed)
Please advise

## 2016-12-14 NOTE — Care Coordination-Inpatient (Signed)
RNCC received call back from cardiology- patient is supposed to be taking 40mg  torsemide daily.     [place] spoke with patient and scheduled acute visit for 1500 on 12/15/16 to address urinary issues.

## 2016-12-14 NOTE — Progress Notes (Signed)
[facility]  Antonietta Breach, MD  [place]  11-10-1956    61 y.o. female     Date of Visit: 12/14/2016      Chief Complaint: rash follow up    History of Present Illness:    1.  She returns today to follow up for intertrigo of the breasts and groin.  She reports minimal improvement with nystatin and triamcinolone creams.  She complains of pain and pruritus.     2.  She also complains of a lesion on the right cheek.     Review of Systems:  Skin: No new or changing moles.    Past Medical History, Family History, Surgical History, Medications and Allergies reviewed.    Past Medical History:   Diagnosis Date   ??? Anxiety    ??? Asthma    ??? Blood circulation, collateral    ??? CAD (coronary artery disease)    ??? CHF (congestive heart failure) ([place])    ??? Chronic back pain    ??? Deep vein thrombosis    ??? Depression    ??? GERD (gastroesophageal reflux disease)     NO LONGER SINCE LAP   ??? GERD (gastroesophageal reflux disease) 01/23/2009   ??? Hx of blood clots    ??? Hyperlipidemia     hx; resolved with lap band   ??? Hypertension    ??? Obesity     hx of; had lap band   ??? Obstructive sleep apnea (adult) (pediatric) 09/18/2015   ??? Pulmonary embolism [facility])      Past Surgical History:   Procedure Laterality Date   ??? BACK SURGERY      neck  plates   ??? BREAST SURGERY      left lumpectomy   ??? CESAREAN SECTION     ??? CHOLECYSTECTOMY     ??? COLONOSCOPY     ??? COLONOSCOPY  04/08/10   ??? CORONARY ANGIOPLASTY     ??? DIAGNOSTIC CARDIAC CATH LAB PROCEDURE     ??? DILATATION, ESOPHAGUS     ??? ENDOSCOPY, COLON, DIAGNOSTIC     ??? HYSTERECTOMY     ??? LAP BAND  05/01/08    Dr. Nicole Cella   ??? OTHER SURGICAL HISTORY      [place] Filter: curently in place   ??? OTHER SURGICAL HISTORY  07/05/13    lap band removal   ??? PARTIAL HYSTERECTOMY     ??? SHOULDER SURGERY     ??? UPPER GASTROINTESTINAL ENDOSCOPY  05/19/13   ??? UPPER GASTROINTESTINAL ENDOSCOPY  07/21/13    ESOPHAGEAL STENT PLACEMENT   ??? UPPER GASTROINTESTINAL ENDOSCOPY N/A 07/31/2014     Esophagogastroduodenoscopy with esophageal balloon dilation   ??? VASCULAR SURGERY  04/2015    Lucendia Herrlich, celiac artery angiogram, normal abd arteries       Allergies   Allergen Reactions   ??? Morphine Itching   ??? Seasonal    ??? Talwin [Pentazocine] Other (See Comments)     dizzy     Outpatient Prescriptions Marked as Taking for the 12/14/16 encounter (Office Visit) with Francisca December, MD   Medication Sig Dispense Refill   ??? clotrimazole (LOTRIMIN AF) 1 % cream Apply to affected areas under the breast and in the groin twice daily until improved. 100 g 2   ??? torsemide (DEMADEX) 20 MG tablet Take 2 tablets by mouth daily 20 tablet 0   ??? spironolactone (ALDACTONE) 25 MG tablet Take 1 tablet by mouth  daily 30 tablet 0   ??? Handicap Placard MISC by Does not apply route DX: chronic diastolic heart failure, dyspnea on exertion    Duration: 1 year 1 each 0   ??? traZODone (DESYREL) 100 MG tablet TAKE ONE TABLET BY MOUTH NIGHTLY 90 tablet 1   ??? venlafaxine (EFFEXOR XR) 150 MG extended release capsule Take 1 capsule by mouth daily 90 capsule 3   ??? venlafaxine (EFFEXOR XR) 75 MG extended release capsule Take 2 capsules by mouth daily (Patient taking differently: Take 75 mg by mouth daily ) 90 capsule 3   ??? fluocinonide (LIDEX) 0.05 % external solution Apply sparingly to the back of the scalp daily if needed for itching. 60 mL 1   ??? metoprolol (LOPRESSOR) 100 MG tablet Take 0.5 tablets by mouth 2 times daily 270 tablet 1   ??? ALPRAZolam (XANAX) 0.25 MG tablet Take 1 tablet by mouth 2 times daily as needed for Anxiety 60 tablet 0   ??? nystatin (MYCOSTATIN) 100000 UNIT/GM cream Apply topically 2 times daily. 30 g 2   ??? lisinopril (PRINIVIL;ZESTRIL) 40 MG tablet Take 1 tablet by mouth daily Indications: Patient thought this was dc'd in December by PCP (Patient taking differently: Take 40 mg by mouth daily ) 90 tablet 3   ??? omeprazole (PRILOSEC) 40 MG delayed release capsule Take 1 capsule by mouth daily 90 capsule 3   ???  oxyCODONE-acetaminophen (PERCOCET) 5-325 MG per tablet Take 1 tablet by mouth every 6 hours as needed for Pain . 40 tablet 0   ??? nitroGLYCERIN (NITROSTAT) 0.4 MG SL tablet up to max of 3 total doses. If no relief after 1 dose, call 911. 25 tablet 3   ??? aspirin 81 MG EC tablet Take 1 tablet by mouth daily 30 tablet 11   ??? prasugrel (EFFIENT) 10 MG TABS Take 1 tablet by mouth daily 90 tablet 3   ??? montelukast (SINGULAIR) 10 MG tablet TAKE ONE TABLET BY MOUTH ONE TIME A DAY 90 tablet 1   ??? albuterol sulfate HFA (PROVENTIL HFA) 108 (90 BASE) MCG/ACT inhaler Inhale 2 puffs into the lungs every 6 hours as needed for Wheezing 3 Inhaler 3   ??? Blood Pressure Monitor KIT Use daily as needed 1 kit 0   ??? ondansetron (ZOFRAN) 4 MG tablet Take 1 tablet by mouth daily as needed for Nausea or Vomiting 30 tablet 1   ??? Multiple Vitamins-Minerals (MULTIVITAMIN PO) Take 1 tablet by mouth daily      ??? CALCIUM-VITAMIN D PO Take 1 tablet by mouth daily      ??? cetirizine (ZYRTEC ALLERGY) 10 MG tablet Take 10 mg by mouth daily.         Physical Examination       The following were examined and determined to be normal: Psych/Neuro, Head/face, Conjunctivae/eyelids, Gums/teeth/lips and Neck.    The following were examined and determined to be abnormal: Breast/axilla/chest and Genitalia/groin/buttocks.     Well appearing.     1.  Below the pannus, inguinal folds and to a lesser extent the inframammary regions are macerated erythematous patches with satellite papules and pustules.      2.  Right lateral cheek with a stuck on appearing verrucous brown papule.        Assessment and Plan     1. Candidiasis of skin     Clotrimazole cream twice daily until improved.    Consider oral Diflucan in the future if needed.     Call with  update in 4 weeks.      2. SK (seborrheic keratosis)     Reassurance.

## 2016-12-15 ENCOUNTER — Ambulatory Visit
Admit: 2016-12-15 | Discharge: 2016-12-15 | Payer: PRIVATE HEALTH INSURANCE | Attending: Physician Assistant | Primary: Sports Medicine

## 2016-12-15 DIAGNOSIS — N390 Urinary tract infection, site not specified: Secondary | ICD-10-CM

## 2016-12-15 LAB — POC URINE WITH MICROSCOPIC
Bacteria Urine, POC: NEGATIVE
Bilirubin Urine: 0 mg/dL
Blood, Urine: POSITIVE
Casts Urine, POC: NEGATIVE
Epi Cells Urine, POC: POSITIVE
Glucose, Ur: NEGATIVE
Ketones, Urine: NEGATIVE
Leukocytes, UA: NEGATIVE
Nitrite, Urine: NEGATIVE
Protein, UA: NEGATIVE
RBC Urine, POC: NEGATIVE
Specific Gravity, UA: 1.015 (ref 1.005–1.030)
Urobilinogen, Urine: NORMAL
WBC Urine, POC: POSITIVE
crystals urine, poc: NEGATIVE
pH, UA: 6 (ref 4.5–8.0)
yeast urine, poc: NEGATIVE

## 2016-12-15 MED ORDER — FLUCONAZOLE 150 MG PO TABS
150 MG | ORAL_TABLET | Freq: Once | ORAL | 0 refills | Status: AC
Start: 2016-12-15 — End: 2016-12-15

## 2016-12-15 MED ORDER — CIPROFLOXACIN HCL 500 MG PO TABS
500 MG | ORAL_TABLET | Freq: Two times a day (BID) | ORAL | 0 refills | Status: AC
Start: 2016-12-15 — End: 2016-12-25

## 2016-12-15 NOTE — Progress Notes (Signed)
Subjective:      Patient ID: Rachael Mays is a 61 y.o. female.    HPI Patient is here today with a 2 day history of urinary frequency, urinary urgency, low abdominal pain, low grade fever. No blood in her urine that she has seen. The urine has been darker in the mornings. She has never had a UTI before.      She also has left ear clogging. She feels like it is wet. She can't hear out of it. No URI symptoms.     Review of Systems   Constitutional: Negative for appetite change, chills and fever.   HENT: Positive for ear discharge and hearing loss.    Respiratory: Negative for shortness of breath.    Cardiovascular: Negative for chest pain.   Gastrointestinal: Positive for abdominal pain. Negative for constipation, diarrhea, nausea and vomiting.   Genitourinary: Positive for difficulty urinating, frequency and urgency. Negative for dysuria and hematuria.   Musculoskeletal: Positive for back pain.       Objective:   Physical Exam   Constitutional: She is oriented to person, place, and time. Vital signs are normal. She appears well-developed and well-nourished. She is cooperative.   HENT:   Left canal and TM covered with wet drainage, not sure if wax or not, she hasn't used drops in 2 weeks   Cardiovascular: Normal rate, regular rhythm and normal heart sounds.    Pulmonary/Chest: Effort normal and breath sounds normal. No respiratory distress. She has no decreased breath sounds.   Abdominal: Soft. Normal appearance and bowel sounds are normal. She exhibits no distension and no mass. There is no hepatosplenomegaly. There is tenderness in the suprapubic area. There is no rigidity, no rebound, no guarding and no CVA tenderness. No hernia.   Neurological: She is alert and oriented to person, place, and time.       Assessment:       Lavonda was seen today for polyuria.    Diagnoses and all orders for this visit:    Urinary tract infection without hematuria, site unspecified  -     POC URINE with Microscopic  -     URINE  CULTURE  -     ciprofloxacin (CIPRO) 500 MG tablet; Take 1 tablet by mouth 2 times daily for 10 days    Impacted cerumen of left ear    Other orders  -     fluconazole (DIFLUCAN) 150 MG tablet; Take 1 tablet by mouth once for 1 dose               Plan:      Push fluids, culture urine.

## 2016-12-15 NOTE — Patient Instructions (Addendum)
Rachael Mays was seen today for polyuria.    Diagnoses and all orders for this visit:    Urinary tract infection without hematuria, site unspecified  -     POC URINE with Microscopic  -     URINE CULTURE  -     ciprofloxacin (CIPRO) 500 MG tablet; Take 1 tablet by mouth 2 times daily for 10 days    Impacted cerumen of left ear    Other orders  -     fluconazole (DIFLUCAN) 150 MG tablet; Take 1 tablet by mouth once for 1 dose            Push fluids.

## 2016-12-15 NOTE — Progress Notes (Signed)
I have reviewed the history and physical note and findings.

## 2016-12-17 LAB — CULTURE, URINE: Urine Culture, Routine: NO GROWTH

## 2016-12-20 ENCOUNTER — Emergency Department: Primary: Sports Medicine

## 2016-12-20 ENCOUNTER — Encounter: Admit: 2016-12-21 | Primary: Sports Medicine

## 2016-12-20 DIAGNOSIS — R079 Chest pain, unspecified: Secondary | ICD-10-CM

## 2016-12-20 NOTE — ED Provider Notes (Signed)
[facility] PROVIDER NOTE    Patient Identification  Pt Name: Rachael Mays  MRN: [id number]  Birthdate Jun 21, 1956  Date of evaluation: 12/20/2016  Provider: Herminio Commons, MD  PCP: Margart Sickles SLOUGH, MD    Chief Complaint  Shortness of Breath (cough congestion CP fatigue SOB dizzy since this morning. history of CHF )      HPI  (History provided by patient and spouse/SO)  This is a 61 y.o. female who was brought in by self for shortness of breath, cough, congestion, and chest pain.  This started this morning.  Patient is also felt lightheaded.  She has a history of CHF and is concerned that she may be having a CHF exacerbation.  She is also concerned about her chest pain.  It is substernal without radiation.  It is moderate to severe.  She is not having fever or chills.    ROS  10 systems reviewed, pertinent positives/negatives per HPI otherwise noted to be negative.    I have reviewed the following nursing documentation:  Allergies: Morphine; Seasonal; and Talwin [pentazocine]    Past medical history:   Past Medical History:   Diagnosis Date   . Anxiety    . Asthma    . Blood circulation, collateral    . CAD (coronary artery disease)    . CHF (congestive heart failure) ([facility])    . Chronic back pain    . Deep vein thrombosis    . Depression    . GERD (gastroesophageal reflux disease)     NO LONGER SINCE LAP   . GERD (gastroesophageal reflux disease) 01/23/2009   . Hx of blood clots    . Hyperlipidemia     hx; resolved with lap band   . Hypertension    . Obesity     hx of; had lap band   . Obstructive sleep apnea (adult) (pediatric) 09/18/2015   . Pulmonary embolism [facility])      Past surgical history:   Past Surgical History:   Procedure Laterality Date   . BACK SURGERY      neck  plates   . BREAST SURGERY      left lumpectomy   . CESAREAN SECTION     . CHOLECYSTECTOMY     . COLONOSCOPY     . COLONOSCOPY  04/08/10   . CORONARY ANGIOPLASTY     . DIAGNOSTIC CARDIAC CATH LAB PROCEDURE     .  DILATATION, ESOPHAGUS     . ENDOSCOPY, COLON, DIAGNOSTIC     . HYSTERECTOMY     . LAP BAND  05/01/08    Dr. Suzi Roots   . OTHER SURGICAL HISTORY      [place] Filter: curently in place   . OTHER SURGICAL HISTORY  07/05/13    lap band removal   . PARTIAL HYSTERECTOMY     . SHOULDER SURGERY     . UPPER GASTROINTESTINAL ENDOSCOPY  05/19/13   . UPPER GASTROINTESTINAL ENDOSCOPY  07/21/13    ESOPHAGEAL STENT PLACEMENT   . UPPER GASTROINTESTINAL ENDOSCOPY N/A 07/31/2014    Esophagogastroduodenoscopy with esophageal balloon dilation   . VASCULAR SURGERY  04/2015    Donzetta Sprung, celiac artery angiogram, normal abd arteries       Home medications:   Previous Medications    ALBUTEROL SULFATE HFA (PROVENTIL HFA) 108 (90 BASE) MCG/ACT INHALER    Inhale 2 puffs into the lungs every 6 hours as needed for Wheezing    ALPRAZOLAM (XANAX) 0.25  MG TABLET    Take 1 tablet by mouth 2 times daily as needed for Anxiety    ASPIRIN 81 MG EC TABLET    Take 1 tablet by mouth daily    BLOOD PRESSURE MONITOR KIT    Use daily as needed    CALCIUM-VITAMIN D PO    Take 1 tablet by mouth daily     CETIRIZINE (ZYRTEC ALLERGY) 10 MG TABLET    Take 10 mg by mouth daily.    CIPROFLOXACIN (CIPRO) 500 MG TABLET    Take 1 tablet by mouth 2 times daily for 10 days    CLOTRIMAZOLE (LOTRIMIN AF) 1 % CREAM    Apply to affected areas under the breast and in the groin twice daily until improved.    FLUOCINONIDE (LIDEX) 0.05 % EXTERNAL SOLUTION    Apply sparingly to the back of the scalp daily if needed for itching.    HANDICAP PLACARD MISC    by Does not apply route DX: chronic diastolic heart failure, dyspnea on exertion    Duration: 1 year    LISINOPRIL (PRINIVIL;ZESTRIL) 40 MG TABLET    Take 1 tablet by mouth daily Indications: Patient thought this was dc'd in December by PCP    METOPROLOL (LOPRESSOR) 100 MG TABLET    Take 0.5 tablets by mouth 2 times daily    MONTELUKAST (SINGULAIR) 10 MG TABLET    TAKE ONE TABLET BY MOUTH ONE TIME A DAY    MULTIPLE VITAMINS-MINERALS  (MULTIVITAMIN PO)    Take 1 tablet by mouth daily     NITROGLYCERIN (NITROSTAT) 0.4 MG SL TABLET    up to max of 3 total doses. If no relief after 1 dose, call 911.    NYSTATIN (MYCOSTATIN) 100000 UNIT/GM CREAM    Apply topically 2 times daily.    OMEPRAZOLE (PRILOSEC) 40 MG DELAYED RELEASE CAPSULE    Take 1 capsule by mouth daily    ONDANSETRON (ZOFRAN) 4 MG TABLET    Take 1 tablet by mouth daily as needed for Nausea or Vomiting    OXYCODONE-ACETAMINOPHEN (PERCOCET) 5-325 MG PER TABLET    Take 1 tablet by mouth every 6 hours as needed for Pain .    PRASUGREL (EFFIENT) 10 MG TABS    Take 1 tablet by mouth daily    SPIRONOLACTONE (ALDACTONE) 25 MG TABLET    Take 1 tablet by mouth daily    TORSEMIDE (DEMADEX) 20 MG TABLET    Take 2 tablets by mouth daily    TRAZODONE (DESYREL) 100 MG TABLET    TAKE ONE TABLET BY MOUTH NIGHTLY    VENLAFAXINE (EFFEXOR XR) 150 MG EXTENDED RELEASE CAPSULE    Take 1 capsule by mouth daily    VENLAFAXINE (EFFEXOR XR) 75 MG EXTENDED RELEASE CAPSULE    Take 2 capsules by mouth daily       Social history:  reports that she quit smoking about 4 years ago. Her smoking use included Cigarettes. She has a 20.00 pack-year smoking history. She quit smokeless tobacco use about 3 years ago. She reports that she does not drink alcohol or use drugs.    Family history:    Family History   Problem Relation Age of Onset   . Arthritis Mother    . Cancer Mother    . Depression Mother    . High Blood Pressure Mother    . Heart Disease Mother 79     MI    . Ovarian Cancer Mother 59   .  Cancer Father 36     colon   . Heart Disease Maternal Grandmother    . Early Death Paternal Grandmother    . Heart Disease Paternal Grandfather    . Diabetes Other    . High Blood Pressure Other    . Obesity Other    . Other Sister      OSA   . Other Brother      OSA   . Other Brother      OSA   . Other Daughter      OSA       Exam  ED Triage Vitals [12/20/16 1948]   BP Temp Temp src Pulse Resp SpO2 Height Weight   119/79 97.8 F  (36.6 C) -- 71 18 96 % 5\' 6"  (1.676 m) 292 lb (132.5 kg)     Nursing note and vitals reviewed.  Constitutional: Well developed, well nourished. Non-toxic in appearance.  HENT:      Head: Normocephalic and atraumatic.      Ears: External ears normal.      Nose: Nose normal.     Mouth: Membrane mucosa moist and pink.  Eyes: Anicteric sclera. No discharge.   Neck: Supple. Trachea midline.   Cardiovascular: RRR; no murmurs, rubs, or gallops.  Pulmonary/Chest: Effort normal. No respiratory distress. CTAB. No stridor. No wheezes. No rales.   Abdominal: Soft. No distension. Non-tender  Musculoskeletal: Moves all extremities. No gross deformity. Trace pitting edema in the bilateral lower extremities.  Neurological: Alert and oriented. Face symmetric. Speech is clear.  Skin: Warm and dry. No rash.  Psychiatric: Normal mood and affect. Behavior is normal.    EKG  The Ekg interpreted by me in the absence of a cardiologist shows.  normal sinus rhythm with a rate of 64  Axis is   Left axis deviation  QTc is  normal  Intervals and Durations are unremarkable.      No specific ST-T wave changes appreciated.  No evidence of acute ischemia.   No significant change from prior EKG dated 09/01/2016      Radiology  XR CHEST STANDARD (2 VW)   Final Result   Cardiomegaly.  No definite radiographic evidence of acute pulmonary disease.   Suboptimal lung expansion for the radiograph series.         CTA PULMONARY W CONTRAST    (Results Pending)       Labs  Results for orders placed or performed during the hospital encounter of 12/20/16   Rapid influenza A/B antigens   Result Value Ref Range    Rapid Influenza A Ag Negative Negative    Rapid Influenza B Ag Negative Negative   Lactic acid, plasma   Result Value Ref Range    Lactic Acid 2.0 0.4 - 2.0 mmol/L   CBC Auto Differential   Result Value Ref Range    WBC 7.0 4.0 - 11.0 K/uL    RBC 4.48 4.00 - 5.20 M/uL    Hemoglobin 13.5 12.0 - 16.0 g/dL    Hematocrit 16.1 09.6 - 48.0 %    MCV 88.7 80.0 -  100.0 fL    MCH 30.2 26.0 - 34.0 pg    MCHC 34.0 31.0 - 36.0 g/dL    RDW 04.5 40.9 - 81.1 %    Platelets 197 135 - 450 K/uL    MPV 8.6 5.0 - 10.5 fL    Neutrophils % 44.0 %    Lymphocytes % 41.2 %    Monocytes % 8.7 %  Eosinophils % 5.1 %    Basophils % 1.0 %    Neutrophils # 3.1 1.7 - 7.7 K/uL    Lymphocytes # 2.9 1.0 - 5.1 K/uL    Monocytes # 0.6 0.0 - 1.3 K/uL    Eosinophils # 0.4 0.0 - 0.6 K/uL    Basophils # 0.1 0.0 - 0.2 K/uL   Comprehensive metabolic panel   Result Value Ref Range    Sodium 141 136 - 145 mmol/L    Potassium 4.0 3.5 - 5.1 mmol/L    Chloride 99 99 - 110 mmol/L    CO2 28 21 - 32 mmol/L    Anion Gap 14 3 - 16    Glucose 133 (H) 70 - 99 mg/dL    BUN 17 7 - 20 mg/dL    CREATININE 1.0 0.6 - 1.1 mg/dL    GFR Non-African American 57 (A) >60    GFR African American >60 >60    Calcium 9.8 8.3 - 10.6 mg/dL    Total Protein 7.4 6.4 - 8.2 g/dL    Alb 4.0 3.4 - 5.0 g/dL    Albumin/Globulin Ratio 1.2 1.1 - 2.2    Total Bilirubin 0.4 0.0 - 1.0 mg/dL    Alkaline Phosphatase 86 40 - 129 U/L    ALT 48 (H) 10 - 40 U/L    AST 44 (H) 15 - 37 U/L    Globulin 3.4 g/dL   Troponin   Result Value Ref Range    Troponin <0.01 <0.01 ng/mL   Brain Natriuretic Peptide   Result Value Ref Range    Pro-BNP 50 0 - 124 pg/mL   Troponin   Result Value Ref Range    Troponin <0.01 <0.01 ng/mL   D-dimer, quantitative   Result Value Ref Range    D-Dimer, Quant 407 (H) 0 - 229 ng/mL DDU       Previous Records  Cardiac Cath 07/17/16:  Anatomy:   LM-normal  LAD-widely patent stent otherwise normal  Cx-normal, dominant  OM1- normal  RCA-normal  LVEF- 70%, LVEDP 15 mmHg    Impression:  1.  Widely patent LAD stent otherwise normal coronary arteries  2.  Hyperdynamic LV systolic function  3.  Non-cardiac chest pain    Plan:  1.  Medical management      MDM and ED Course  The patient improved with treatment. I reviewed the patient's chart.  She had a cardiac catheterization performed in August that was unremarkable.  Serial troponins were  normal.  Her EKG was without acute ischemic change.  Her d-dimer was elevated.  She initially refused to CTA.  It was canceled.  When went to speak to her, we discussed risks and benefits of the procedure, particularly in light of her elevated d-dimer.  The patient changed her mind and I reordered the CTA. If this is normal, I anticipate discharge home to follow up with her PCP.    1:00 AM  I have signed out [facility] Emergency Department care to Dr. Magnus Sinning. We discussed the pertinent history, physical exam, completed/pending test results (if applicable) and current treatment plan. Please refer to his/her chart for the patients remaining Emergency Department course and final disposition.      Final Impression  1. Chest pain, unspecified type        Blood pressure 119/79, pulse 71, temperature 97.8 F (36.6 C), resp. rate 16, height 5\' 6"  (1.676 m), weight 292 lb (132.5 kg), SpO2 96 %, not currently breastfeeding.  Disposition:  Discharge home if CTA is normal.        This chart was generated using the Dragon dictation system. I created this record but it may contain dictation errors given the limitations of this technology.       Herminio Commons, MD  12/21/16 0110

## 2016-12-20 NOTE — ED Provider Notes (Signed)
Physician-In-Triage Note    I performed a medical screening examination and evaluated this patient briefly with the purpose of initiating their ED workup in an expeditious manner.  Please see notes from other ED providers regarding comprehensive evaluation including full history, physical exam, interpretation of results, and medical decision making/disposition.    In brief, Rachael Mays is a 61 y.o. female who presents to the ED complaining of chest pain, cough, nausea, fevers subjectively.  Did get flu shot.    Focused physical examination notable for no acute distress, well-appearing, well-nourished, normal speech and mentation without obvious facial droop, no obvious rash.  No obvious cranial nerve deficits on my initial exam.  Faint wheezing throughout.  RRR.  Abd soft NTND.  Tearful.     Vitals reviewed per nursing documentation.    Triage orders placed, including flu, labs, CXR, EKG.    I did not personally review any of the results of these tests, which will be reviewed and interpreted later by ED providers at the time of the patient's more comprehensive evaluation.       Dalbert Batman, MD  12/20/16 [phone]

## 2016-12-21 ENCOUNTER — Emergency Department: Admit: 2016-12-21 | Primary: Sports Medicine

## 2016-12-21 ENCOUNTER — Inpatient Hospital Stay
Admit: 2016-12-21 | Discharge: 2016-12-21 | Disposition: A | Discharge status: Home / Self Care | Payer: PRIVATE HEALTH INSURANCE | Attending: Emergency Medicine

## 2016-12-21 LAB — COMPREHENSIVE METABOLIC PANEL
ALT: 48 U/L — ABNORMAL HIGH (ref 10–40)
AST: 44 U/L — ABNORMAL HIGH (ref 15–37)
Albumin/Globulin Ratio: 1.2 (ref 1.1–2.2)
Albumin: 4 g/dL (ref 3.4–5.0)
Alkaline Phosphatase: 86 U/L (ref 40–129)
Anion Gap: 14 (ref 3–16)
BUN: 17 mg/dL (ref 7–20)
CO2: 28 mmol/L (ref 21–32)
Calcium: 9.8 mg/dL (ref 8.3–10.6)
Chloride: 99 mmol/L (ref 99–110)
Creatinine: 1 mg/dL (ref 0.6–1.1)
GFR African American: >60 (ref >60)
GFR Non-African American: 57 — AB (ref >60)
Globulin: 3.4 g/dL
Glucose: 133 mg/dL — ABNORMAL HIGH (ref 70–99)
Potassium: 4 mmol/L (ref 3.5–5.1)
Sodium: 141 mmol/L (ref 136–145)
Total Bilirubin: 0.4 mg/dL (ref 0.0–1.0)
Total Protein: 7.4 g/dL (ref 6.4–8.2)

## 2016-12-21 LAB — CBC WITH AUTO DIFFERENTIAL
Basophils %: 1 %
Basophils Absolute: 0.1 10*3/uL (ref 0.0–0.2)
Eosinophils %: 5.1 %
Eosinophils Absolute: 0.4 10*3/uL (ref 0.0–0.6)
Hematocrit: 39.8 % (ref 36.0–48.0)
Hemoglobin: 13.5 g/dL (ref 12.0–16.0)
Lymphocytes %: 41.2 %
Lymphocytes Absolute: 2.9 10*3/uL (ref 1.0–5.1)
MCH: 30.2 pg (ref 26.0–34.0)
MCHC: 34 g/dL (ref 31.0–36.0)
MCV: 88.7 fL (ref 80.0–100.0)
MPV: 8.6 fL (ref 5.0–10.5)
Monocytes %: 8.7 %
Monocytes Absolute: 0.6 10*3/uL (ref 0.0–1.3)
Neutrophils %: 44 %
Neutrophils Absolute: 3.1 10*3/uL (ref 1.7–7.7)
Platelets: 197 10*3/uL (ref 135–450)
RBC: 4.48 M/uL (ref 4.00–5.20)
RDW: 14 % (ref 12.4–15.4)
WBC: 7 10*3/uL (ref 4.0–11.0)

## 2016-12-21 LAB — RAPID INFLUENZA A/B ANTIGENS
Rapid Influenza A Ag: NEGATIVE
Rapid Influenza B Ag: NEGATIVE

## 2016-12-21 LAB — LACTIC ACID: Lactic Acid: 2 mmol/L (ref 0.4–2.0)

## 2016-12-21 LAB — TROPONIN
Troponin: <0.01 ng/mL (ref <0.01)
Troponin: <0.01 ng/mL (ref <0.01)

## 2016-12-21 LAB — BRAIN NATRIURETIC PEPTIDE: Pro-BNP: 50 pg/mL (ref 0–124)

## 2016-12-21 LAB — D-DIMER, QUANTITATIVE: D-Dimer, Quant: 407 ng/mL DDU — ABNORMAL HIGH (ref 0–229)

## 2016-12-21 MED ORDER — KETOROLAC TROMETHAMINE 30 MG/ML IJ SOLN
30 MG/ML | Freq: Once | INTRAMUSCULAR | Status: AC
Start: 2016-12-21 — End: 2016-12-20
  Administered 2016-12-21: 02:00:00 30 mg via INTRAVENOUS

## 2016-12-21 MED ORDER — METHYLPREDNISOLONE SODIUM SUCC 125 MG IJ SOLR
125 MG | Freq: Once | INTRAMUSCULAR | Status: AC
Start: 2016-12-21 — End: 2016-12-20
  Administered 2016-12-21: 02:00:00 125 mg via INTRAVENOUS

## 2016-12-21 MED ORDER — ONDANSETRON HCL 4 MG/2ML IJ SOLN
4 MG/2ML | INTRAMUSCULAR | Status: DC | PRN
Start: 2016-12-21 — End: 2016-12-21

## 2016-12-21 MED ORDER — PROMETHAZINE HCL 25 MG/ML IJ SOLN
25 MG/ML | Freq: Once | INTRAMUSCULAR | Status: AC
Start: 2016-12-21 — End: 2016-12-20
  Administered 2016-12-21: 02:00:00 12.5 mg via INTRAVENOUS

## 2016-12-21 MED ORDER — HYDROCODONE-ACETAMINOPHEN 7.5-325 MG PO TABS
Freq: Once | ORAL | Status: AC
Start: 2016-12-21 — End: 2016-12-21
  Administered 2016-12-21: 05:00:00 1 via ORAL

## 2016-12-21 MED ORDER — IPRATROPIUM-ALBUTEROL 0.5-2.5 (3) MG/3ML IN SOLN
Freq: Once | RESPIRATORY_TRACT | Status: AC
Start: 2016-12-21 — End: 2016-12-20
  Administered 2016-12-21: 02:00:00 1 via RESPIRATORY_TRACT

## 2016-12-21 MED ORDER — HYDROMORPHONE 0.5MG/0.5ML IJ SOLN
1 MG/ML | Freq: Once | Status: AC
Start: 2016-12-21 — End: 2016-12-20
  Administered 2016-12-21: 03:00:00 0.5 mg via INTRAVENOUS

## 2016-12-21 MED ORDER — IOPAMIDOL 76 % IV SOLN
76 % | Freq: Once | INTRAVENOUS | Status: AC | PRN
Start: 2016-12-21 — End: 2016-12-21
  Administered 2016-12-21: 06:00:00 100 mL via INTRAVENOUS

## 2016-12-21 MED FILL — HYDROMORPHONE HCL 1 MG/ML IJ SOLN: 1 MG/ML | INTRAMUSCULAR | Qty: 0.5

## 2016-12-21 MED FILL — ONDANSETRON HCL 4 MG/2ML IJ SOLN: 4 MG/2ML | INTRAMUSCULAR | Qty: 2

## 2016-12-21 MED FILL — HYDROCODONE-ACETAMINOPHEN 7.5-325 MG PO TABS: ORAL | Qty: 1

## 2016-12-21 MED FILL — IPRATROPIUM-ALBUTEROL 0.5-2.5 (3) MG/3ML IN SOLN: RESPIRATORY_TRACT | Qty: 3

## 2016-12-21 MED FILL — KETOROLAC TROMETHAMINE 30 MG/ML IJ SOLN: 30 MG/ML | INTRAMUSCULAR | Qty: 1

## 2016-12-21 MED FILL — PROMETHAZINE HCL 25 MG/ML IJ SOLN: 25 MG/ML | INTRAMUSCULAR | Qty: 1

## 2016-12-21 MED FILL — SOLU-MEDROL 125 MG IJ SOLR: 125 MG | INTRAMUSCULAR | Qty: 125

## 2016-12-21 NOTE — ED Notes (Signed)
Assumed care - pt resting at present with husband at bedside. Respirations even and unlabored. SR noted on monitor. Call bell in reach.     Pamala Hurry, RN  12/21/16 0120

## 2016-12-21 NOTE — Progress Notes (Signed)
Done with CT, patient back in room

## 2016-12-21 NOTE — ED Notes (Signed)
Pt in [place], RN  12/21/16 [phone]

## 2016-12-21 NOTE — ED Notes (Signed)
Resting at present - no complaints verbalized. Pt to be discharged home.     Christean Grief, RN  12/21/16 0230

## 2016-12-22 LAB — BASIC METABOLIC PANEL
Anion Gap: 13 (ref 3–16)
BUN: 23 mg/dL — ABNORMAL HIGH (ref 7–20)
CO2: 29 mmol/L (ref 21–32)
Calcium: 9.7 mg/dL (ref 8.3–10.6)
Chloride: 101 mmol/L (ref 99–110)
Creatinine: 1.3 mg/dL — ABNORMAL HIGH (ref 0.6–1.1)
GFR African American: 51 — AB (ref >60)
GFR Non-African American: 42 — AB (ref >60)
Glucose: 141 mg/dL — ABNORMAL HIGH (ref 70–99)
Potassium: 4.4 mmol/L (ref 3.5–5.1)
Sodium: 143 mmol/L (ref 136–145)

## 2016-12-23 NOTE — Telephone Encounter (Signed)
Notes Recorded by Dorian Furnace, CNP on 12/23/2016 at 8:21 AM EST  Notify patient labs suggest she getting a little on dry side and we need to back off on torsemide dose. Records show she is taking 40 mg daily. Please confirm this and ask if she has taken any extra doses. If dose is correct and she has not taken any extra doses or been ill with GI symptoms (nausea/vomiting/diarrhea) then ask her to decrease torsemide to 40 mg on Mon-Wed-Fri and 20 mg remaining days. We need to recheck labs in two weeks. Thanks.      Call placed for the patient. LVM for her to return call for results/instructions.

## 2016-12-24 LAB — EKG 12-LEAD
Atrial Rate: 64 {beats}/min
Diagnosis: NORMAL
P Axis: 54 degrees
P-R Interval: 142 ms
Q-T Interval: 416 ms
QRS Duration: 78 ms
QTc Calculation (Bazett): 429 ms
R Axis: -6 degrees
T Axis: 30 degrees
Ventricular Rate: 64 {beats}/min

## 2016-12-24 NOTE — Telephone Encounter (Signed)
Attempted to contact the patient, LVM to call our office.

## 2016-12-24 NOTE — Telephone Encounter (Signed)
Spoke to the pt. Confirmed that the Torsemide dosage instructions are 40 mg, BID. Her hands, abdomen, calves, and feet are swollen. Complains of feeling weak. Has an office visit with Dr. Diana Eves this afternoon.   Please call her cell phone, [phone].

## 2016-12-25 ENCOUNTER — Ambulatory Visit
Admit: 2016-12-25 | Discharge: 2016-12-25 | Payer: PRIVATE HEALTH INSURANCE | Attending: Cardiovascular Disease | Primary: Sports Medicine

## 2016-12-25 ENCOUNTER — Inpatient Hospital Stay: Attending: Cardiovascular Disease | Primary: Sports Medicine

## 2016-12-25 DIAGNOSIS — R0602 Shortness of breath: Secondary | ICD-10-CM

## 2016-12-25 DIAGNOSIS — R0789 Other chest pain: Secondary | ICD-10-CM

## 2016-12-25 NOTE — Telephone Encounter (Signed)
Pt calling back. Stated her weight is up four pounds. She will see Dr. Susy Manor today at 3:30. Asking if Santiago Glad can stop in, and speak to her.

## 2016-12-25 NOTE — Telephone Encounter (Signed)
Attempted to return patient call. Message left requesting call back.

## 2016-12-25 NOTE — Progress Notes (Signed)
[place]   Cardiac Follow up    Referring Provider:  Harvel Quale, MD     Chief Complaint   Patient presents with   ??? Coronary Artery Disease     C/o sob,edema in hands,stomach,and face.F/up labs   ??? Hypertension   ??? Hyperlipidemia   ??? Congestive Heart Failure        History of Present Illness:  Ms. Boggan presents for a follow up visit.  She had normal coronaries angiogram in October 2016 and June 2013.  She underwent repeat cardiac cath in April 2017 and at that time was noted to have significant stenosis in the mid LAD which was revascularized with drug-eluting stent implantation.  This had no impact on her chest pain in terms of resolution.  She has morbid obesity and underwent lap band surgery.  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  She was seen by Dr. Jeb Levering who did a follow up CTA of her chest and CTPA  for continued shortness of breath 03/25/16 that showed no evidence of pulmonary embolism or acute pulmonary abnormality. A chest xray on the same day suggested congestive heart failure.  Her diuretics were increased and her swelling improved but she essentially had no improvement in her shortness of breath and chest pain.  The chest pain did have a component of tenderness on palpation in the past.  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  It was hypothesized that this may be contributing to the left-sided chest discomfort.  She has been seen by Dr. Diana Eves, pain management.  She was hospitalized 06/16/16-06/18/16 for chest pain.    She was hospitalized in August 2017 with chest pain and shortness of breath.  She ruled out for MI.  She underwent medication adjustment which helped some.  She was seen recent in the ER on 12/20/16 with chest pain.  She has had chest pain that is left sided, sharp in nature, no reproducible/identifiable trigger.  She also has episodes of chest discomfort  described as heaviness.  NTG has helped in the past but not with 12/20/16 chest pain episode.  She continues to follow with Dr. Diana Eves for pain management.  She feels like she is swelling in her face and hands, but avoids sodium/salt and eats a lot of fresh fruits and vegetables.  She is compliant with Bi-PAP machine.  She follows with heart failure NP for management of diastolic heart failure, but she has not seen Dr. Jacqualyn Posey.  She wants to exercise, but has been unable to due to current symptoms.      Past Medical History:   has a past medical history of Anxiety; Asthma; Blood circulation, collateral; CAD (coronary artery disease); CHF (congestive heart failure) ([place]); Chronic back pain; Deep vein thrombosis; Depression; GERD (gastroesophageal reflux disease); GERD (gastroesophageal reflux disease); Hx of blood clots; Hyperlipidemia; Hypertension; Obesity; Obstructive sleep apnea (adult) (pediatric); and Pulmonary embolism ([place]).    Surgical History:   has a past surgical history that includes Colonoscopy; Cesarean section; Cholecystectomy; Hysterectomy; shoulder surgery; Lap Band (05/01/08); back surgery; Colonoscopy (04/08/10); other surgical history; Upper gastrointestinal endoscopy (05/19/13); other surgical history (07/05/13); Upper gastrointestinal endoscopy (07/21/13); partial hysterectomy (cervix not removed); Upper gastrointestinal endoscopy (N/A, 07/31/2014); vascular surgery (04/2015); Diagnostic Cardiac Cath Lab Procedure; Coronary angioplasty; Endoscopy, colon, diagnostic; Breast surgery; and Dilatation, esophagus.     Social History:   reports that she quit  smoking about 4 years ago. Her smoking use included Cigarettes. She has a 20.00 pack-year smoking history. She quit smokeless tobacco use about 3 years ago. She reports that she does not drink alcohol or use drugs.     Family History:  family history includes Arthritis in her mother; Cancer in her mother; Cancer (age of onset: 38) in her father;  Depression in her mother; Diabetes in an other family member; Early Death in her paternal grandmother; Heart Disease in her maternal grandmother and paternal grandfather; Heart Disease (age of onset: 36) in her mother; High Blood Pressure in her mother and another family member; Obesity in an other family member; Other in her brother, brother, daughter, and sister; Ovarian Cancer (age of onset: 51) in her mother.     Home Medications:  Prior to Admission medications    Medication Sig Start Date End Date Taking? Authorizing Provider   torsemide (DEMADEX) 20 MG tablet Take 2 tablets by mouth daily 11/27/16  Yes Rosann Vernell Leep, NP   spironolactone (ALDACTONE) 25 MG tablet Take 1 tablet by mouth daily 09/28/16  Yes Rosann Vernell Leep, NP   Handicap Placard MISC by Does not apply route DX: chronic diastolic heart failure, dyspnea on exertion    Duration: 1 year 08/28/16  Yes Dorian Furnace, CNP   traZODone (DESYREL) 100 MG tablet TAKE ONE TABLET BY MOUTH NIGHTLY 08/03/16  Yes Harvel Quale, MD   venlafaxine (EFFEXOR XR) 150 MG extended release capsule Take 1 capsule by mouth daily 07/27/16  Yes Harvel Quale, MD   venlafaxine (EFFEXOR XR) 75 MG extended release capsule Take 2 capsules by mouth daily  Patient taking differently: Take 75 mg by mouth daily  07/27/16  Yes Harvel Quale, MD   metoprolol (LOPRESSOR) 100 MG tablet Take 0.5 tablets by mouth 2 times daily 07/17/16  Yes Rana Anders Grant, MD   ALPRAZolam Duanne Moron) 0.25 MG tablet Take 1 tablet by mouth 2 times daily as needed for Anxiety 06/22/16  Yes Harvel Quale, MD   lisinopril (PRINIVIL;ZESTRIL) 40 MG tablet Take 1 tablet by mouth daily Indications: Patient thought this was dc'd in December by PCP  Patient taking differently: Take 40 mg by mouth daily  04/09/16  Yes Harvel Quale, MD   omeprazole (PRILOSEC) 40 MG delayed release capsule Take 1 capsule by mouth daily 04/09/16  Yes Harvel Quale, MD    oxyCODONE-acetaminophen (PERCOCET) 5-325 MG per tablet Take 1 tablet by mouth every 6 hours as needed for Pain . 04/07/16  Yes Bhumit Patel, MD   nitroGLYCERIN (NITROSTAT) 0.4 MG SL tablet up to max of 3 total doses. If no relief after 1 dose, call 911. 04/06/16  Yes Bhumit Patel, MD   aspirin 81 MG EC tablet Take 1 tablet by mouth daily 04/02/16  Yes Leodis Binet, MD   prasugrel (EFFIENT) 10 MG TABS Take 1 tablet by mouth daily 04/02/16  Yes Leodis Binet, MD   montelukast (SINGULAIR) 10 MG tablet TAKE ONE TABLET BY MOUTH ONE TIME A DAY 03/18/16  Yes Jamal Collin, MD   albuterol sulfate HFA (PROVENTIL HFA) 108 (90 BASE) MCG/ACT inhaler Inhale 2 puffs into the lungs every 6 hours as needed for Wheezing 12/27/15  Yes Harvel Quale, MD   Blood Pressure Monitor KIT Use daily as needed 12/20/15  Yes Harvel Quale, MD   ondansetron (ZOFRAN) 4 MG tablet Take 1 tablet  by mouth daily as needed for Nausea or Vomiting 06/03/15  Yes Harvel Quale, MD   Multiple Vitamins-Minerals (MULTIVITAMIN PO) Take 1 tablet by mouth daily    Yes Historical Provider, MD   CALCIUM-VITAMIN D PO Take 1 tablet by mouth daily    Yes Historical Provider, MD   cetirizine (ZYRTEC ALLERGY) 10 MG tablet Take 10 mg by mouth daily.   Yes Historical Provider, MD   ciprofloxacin (CIPRO) 500 MG tablet Take 1 tablet by mouth 2 times daily for 10 days 12/15/16 12/25/16  Darrick Penna, MD   fluocinonide (LIDEX) 0.05 % external solution Apply sparingly to the back of the scalp daily if needed for itching. 07/23/16   Francisca December, MD   nystatin (MYCOSTATIN) 100000 UNIT/GM cream Apply topically 2 times daily. 05/05/16   Francisca December, MD        Allergies:  Morphine; Seasonal; and Talwin [pentazocine]     Review of Systems:   A 10 point review of systems is negative except as noted in the history of present illness.      Physical Examination:    Vitals:    12/25/16 1603   BP: 110/70   Pulse: 64   SpO2: 98%        Constitutional  and General Appearance: NAD   Respiratory:  ?? Normal excursion and expansion without use of accessory muscles  ?? Resp Auscultation: Normal breath sounds without dullness  Cardiovascular:  ?? The apical impulses not displaced  ?? JVD is normal  ?? The carotid upstroke is normal in amplitude and contour without delay or bruit  ?? Normal S1S2, No S3, No Murmur, No rub  ?? Peripheral pulses are symmetrical and full  ?? There is no clubbing, cyanosis of the extremities.  ?? Trace edema - unchanged from last visit  ?? Pedal Pulses: 2+ and equal   Abdomen:  ?? No masses or tenderness  ?? Liver/Spleen: No Abnormalities Noted  Neurological/Psychiatric:  ?? Alert and oriented in all spheres  ?? Moves all extremities well  ?? Exhibits normal gait balance and coordination  ?? No abnormalities of mood, affect, memory, mentation, or behavior are noted    CTPA 12/21/16:  Negative for acute pulmonary embolism.   ??   Mild airway inflammation. ??No consolidative pneumonia.     CTPA 06/16/16:  No evidence of pulmonary embolism.   ??   No acute abnormality.     Cardiac monitor 06/01/16:  Predominantly sinus rhythm with occasional PACs and PVCs    Echo 04/06/16:  Summary  ??Normal left ventricle size and systolic function with an estimated ejection??fraction of 65%.  ??There is mild concentric left ventricular hypertrophy.  ??Mild mitral regurgitation is present.  ??The aortic valve appears sclerotic but opens well.  ??There is mild tricuspid regurgitation with RVSP estimated at 42 mmHg.  ??Mild pulmonic regurgitation present.  ??Definity was used to better delineate endocardium.    Cardiac Cath 03/31/16:  Anatomy:   LM-normal   LAD-70% mid with FFR 0.77  Cx-normal, codominant  OM1- normal  RCA-normal  RPDA- normal  LVEF- 70%, LVEDP 19   PCI: LAD 70% to 0% with 3.25 mm x 18 mm Xience Alpine drug-eluting stent     Hemodynamics:  RA-15/12 mean 9  RV- 60/0, 9  PAWP-20/26 mean 19  PA- 60/12 mean 33        Impression:  1.  Severe 1 vessel CAD involving the LAD with  successful drug-eluting stent implantation.  2.  Hyperdynamic LV systolic function.  3.  Mildly decompensated diastolic CHF.  4.  Moderate pulmonary hypertension.     Plan:  1.  Aspirin indefinitely.  2.  Effient/equivalent for minimum of 1 year.  3.  IV diuresis while in the hospital and then discharged on Lasix 40 mg by mouth twice a day.  4.  Continue BiPAP therapy.  5.  Outpatient cardiac rehab.  6.  CHF service follow-up on discharge.      CTPA 03/25/16:  No evidence of pulmonary embolism or acute pulmonary abnormality.    Chest x-ray 03/25/16:  No acute process.    ECG 25-Mar-2016 17:28:21 [facility]:  Sinus bradycardia  Otherwise normal ECG  No significant change    CTPA 03/10/16:  No evidence of pulmonary embolus.   Small right and trace left pleural effusions, new as well as interlobular   septal wall thickening. ??Findings likely related to edema.   Mild mediastinal adenopathy, new. ??Findings may be reactive. ??Recommend   follow-up to ensure resolution.         Chest x-ray 02/13/16:  FINDINGS:   Cardiomegaly. ??There appears be pulmonary vascular congestion and   interstitial edema. ??No focal airspace disease.   ??     ??   Impression   Findings suggest congestive heart failure   ??     Echo 10/07/15:  Summary  ??Technically limited study due to body habitus.  ??Normal left ventricle size and systolic function with an estimated??ejection fraction of 55%. No regional wall motion abnormalities are seen.  ??There is mild concentric left ventricular hypertrophy.    Cardiac Cath 09/20/15:  Anatomy:   LM-normal  LAD-normal  Cx-normal  OM1- normal  RCA-normal, codominant  RPDA- normal  LVEF- 60%  ??  Impression:  1. Normal coronaries  2. Normal LV systolic function  3. False positive stress test      Myoview Stress Test  09/18/15:  Summary   ??-Moderate sized anterior mostly reversible defect consistent with ischemia   ??in the territory of the mid LAD .   ??-Normal LV function.   ??-Study is limited by breast attenuation.        Cardiac cath 06/03/12:  Normal coronaries and LVEF       Assessment:   1. Atypical chest pain    2. Coronary artery disease due to lipid rich plaque    3. Status post insertion of drug-eluting stent into left anterior descending (LAD) artery    4. Pulmonary hypertension    5. Chronic diastolic congestive heart failure ([place])    6. Shortness of breath    7. Edema, unspecified type    8. Essential hypertension    9. Dyslipidemia      Chest pain:  Atypical for angina, at times has musculoskeletal component to it.  Unclear if pulmonary hypertension is contributing to chest discomfort.    Sees pain management specialist.     Coronary artery disease  Hx of DES to LAD in 03/2016.    Treated with ASA 81 mg, Effient 10 mg daily, Metoprolol  Do not think current chest pain symptoms are anginal in nature    Pulmonary hypertension/Diastolic heart failure:  04/9562 RHC showed PA- 60/12 mean 33  04/2016 Echo showed RVSP 47 mmHg  Patient has shortness of breath with exertion  Also reports swelling in face and hands, although appears compensated on exam.      On Demadex, Spironolactone, and Lisinopril  Will have patient see Dr. Jacqualyn Posey for  evaluation > does patient need med adjustment of is repeat RHC needed?   Will  Check BNP, BMP as well as ANA, sed rate, CRP looking for autoimmune or connective tissue disorder.      HTN:  Blood pressure 110/70, pulse 64, height 5' 6" (1.676 m), weight 298 lb (135.2 kg), SpO2 98 %, not currently breastfeeding.  stable    HLD:   06/2016 TC- 118, TG- 114, HDL 43, LDL 52  No longer taking Lipitor due to leg cramps (stopped by PCP)    Plan:    1.  No change in medication  2.  Labs soon - CRP, ANA, sed rate, BMP, BNP  3.  See Dr. Jacqualyn Posey for evaluation of shortness of breath, edema, pulmonary hypertension etc  4.  Follow up with me in 8 weeks.       Thank you for allowing me to participate in the care of this individual.    Stephannie Li. Susy Manor, M.D., [facility]

## 2016-12-26 LAB — BASIC METABOLIC PANEL
Anion Gap: 14 (ref 3–16)
BUN: 20 mg/dL (ref 7–20)
CO2: 26 mmol/L (ref 21–32)
Calcium: 9.6 mg/dL (ref 8.3–10.6)
Chloride: 104 mmol/L (ref 99–110)
Creatinine: 1 mg/dL (ref 0.6–1.1)
GFR African American: >60 (ref >60)
GFR Non-African American: 57 — AB (ref >60)
Glucose: 103 mg/dL — ABNORMAL HIGH (ref 70–99)
Potassium: 4.7 mmol/L (ref 3.5–5.1)
Sodium: 144 mmol/L (ref 136–145)

## 2016-12-26 LAB — SEDIMENTATION RATE: Sed Rate: 23 mm/Hr (ref 0–30)

## 2016-12-26 LAB — C-REACTIVE PROTEIN: CRP: 5.2 mg/L — ABNORMAL HIGH (ref 0.0–5.1)

## 2016-12-26 LAB — BRAIN NATRIURETIC PEPTIDE: Pro-BNP: 86 pg/mL (ref 0–124)

## 2016-12-26 NOTE — Telephone Encounter (Signed)
Patient c/o pain left upper chest . Does not change with movement   was just in ER and saw Dr Susy Manor yesterday, was having pain then. Reviewed chart, tests were noirmal. Patient not short of breath on hte phone, reassured, recommend heating pad and f/u as needed office

## 2016-12-26 NOTE — Care Coordination-Inpatient (Signed)
Ambulatory Care Coordination Note  12/26/2016  CM Risk Score: 11  Gagne Mortality Risk Score:      ACC: Roderic Scarce, RN    Summary Note: RNCC received text message from patient on work cell phone saying she had a question. RNCC placed call to patient. Rachael Mays reported that she was in the ER last Sunday (12/20/16) for SOB and chest pain. She reports she was given pain medication, but no IV diuretics in the ER.   She reports she is experiencing similar symptoms now. She reports chest pain, left arm pain, SOB, BLE edema and abd fullness. She reports her weight is up to 297.8 (when [facility] last spoke to patient on 12/13/16 she was 289.6 lbs). She reports that her weight keeps "creeping up" and that she gained 3.5lbs overnight on Thursday. She notified  Jacqualine Code, heart failure CNP with cardiology and saw Dr. Susy Manor on Friday and reported her weight gain and chest pain. He instructed the patient to follow up with Dr. Jacqualyn Posey to address her concerns.   Patient reports she has been compliant with her torsemide.   RNCC instructed the patient to call MD on call at [place] to discuss her symptoms. Patient agreeable.       Care Coordination Interventions    Program Enrollment:  Complex Care  Referral from Primary Care Provider:  No  Suggested Interventions and Community Resources  Zone Management Tools:  In Process  Other Services or Interventions:  declined CHF class at this time.          Goals Addressed             Most Recent    ??? Self Monitoring   On track (12/26/2016)             Daily Weights - I will notify my provider of any increase in weight by 3 or more pounds in 2 days OR 5 or more pounds in a week.    None Recently Recorded    Barriers: none  Plan for overcoming my barriers: N/A  Confidence: 10/10  Anticipated Goal Completion Date: 3 months                Prior to Admission medications    Medication Sig Start Date End Date Taking? Authorizing Provider   torsemide (DEMADEX) 20 MG tablet Take 2 tablets  by mouth daily 11/27/16   Ernesto Rutherford, NP   spironolactone (ALDACTONE) 25 MG tablet Take 1 tablet by mouth daily 09/28/16   Ernesto Rutherford, NP   Handicap Placard MISC by Does not apply route DX: chronic diastolic heart failure, dyspnea on exertion    Duration: 1 year 08/28/16   Dorian Furnace, CNP   traZODone (DESYREL) 100 MG tablet TAKE ONE TABLET BY MOUTH NIGHTLY 08/03/16   Harvel Quale, MD   venlafaxine (EFFEXOR XR) 150 MG extended release capsule Take 1 capsule by mouth daily 07/27/16   Harvel Quale, MD   venlafaxine (EFFEXOR XR) 75 MG extended release capsule Take 2 capsules by mouth daily  Patient taking differently: Take 75 mg by mouth daily  07/27/16   Harvel Quale, MD   fluocinonide (LIDEX) 0.05 % external solution Apply sparingly to the back of the scalp daily if needed for itching. 07/23/16   Francisca December, MD   metoprolol (LOPRESSOR) 100 MG tablet Take 0.5 tablets by mouth 2 times daily 07/17/16   Rana Anders Grant, MD   ALPRAZolam (  XANAX) 0.25 MG tablet Take 1 tablet by mouth 2 times daily as needed for Anxiety 06/22/16   Harvel Quale, MD   nystatin (MYCOSTATIN) 100000 UNIT/GM cream Apply topically 2 times daily. 05/05/16   Francisca December, MD   lisinopril (PRINIVIL;ZESTRIL) 40 MG tablet Take 1 tablet by mouth daily Indications: Patient thought this was dc'd in December by PCP  Patient taking differently: Take 40 mg by mouth daily  04/09/16   Harvel Quale, MD   omeprazole [facility]) 40 MG delayed release capsule Take 1 capsule by mouth daily 04/09/16   Harvel Quale, MD   oxyCODONE-acetaminophen (PERCOCET) 5-325 MG per tablet Take 1 tablet by mouth every 6 hours as needed for Pain . 04/07/16   Bhumit Patel, MD   nitroGLYCERIN (NITROSTAT) 0.4 MG SL tablet up to max of 3 total doses. If no relief after 1 dose, call 911. 04/06/16   Trena Platt, MD   aspirin 81 MG EC tablet Take 1 tablet by mouth daily 04/02/16   Leodis Binet, MD    prasugrel (EFFIENT) 10 MG TABS Take 1 tablet by mouth daily 04/02/16   Leodis Binet, MD   montelukast (SINGULAIR) 10 MG tablet TAKE ONE TABLET BY MOUTH ONE TIME A DAY 03/18/16   Jamal Collin, MD   albuterol sulfate HFA (PROVENTIL HFA) 108 (90 BASE) MCG/ACT inhaler Inhale 2 puffs into the lungs every 6 hours as needed for Wheezing 12/27/15   Harvel Quale, MD   Blood Pressure Monitor KIT Use daily as needed 12/20/15   Harvel Quale, MD   ondansetron [facility]) 4 MG tablet Take 1 tablet by mouth daily as needed for Nausea or Vomiting 06/03/15   Harvel Quale, MD   Multiple Vitamins-Minerals (MULTIVITAMIN PO) Take 1 tablet by mouth daily     Historical Provider, MD   CALCIUM-VITAMIN D PO Take 1 tablet by mouth daily     Historical Provider, MD   cetirizine (ZYRTEC ALLERGY) 10 MG tablet Take 10 mg by mouth daily.    Historical Provider, MD       Future Appointments  Date Time Provider [place]   01/11/2017 11:30 AM Connye Burkitt, MD [facility]   02/08/2017 1:20 PM Tiffanie Rita Ohara, CNP [facility]   03/08/2017 10:45 AM Leodis Binet, MD [facility]     General Assessment    Do you have any symptoms that are causing concern?:  Yes  Progression since Onset:  Gradually Worsening  Reported Symptoms:  Weight Gain, Chest Pain      and   Congestive Heart Failure Assessment    Are you currently restricting fluids?:  2000cc  Do you understand a low sodium diet?:  Yes  Do you understand how to read food labels?:  Yes  Do you salt your food before tasting it?:  No     Increase in weights (more than 3lbs overnight or 5lbs in one week), Shortness of breath (worse than baseline), Swelling (worse than baseline) in hands, feet/legs or around abdomen, Other symptoms causing concern (Comment: chest pain )      Symptoms:   CHF associated angina: Pos, CHF associated fatigue: Pos, CHF associated leg swelling: Pos, CHF associated shortness of breath: Pos      Symptom course:   worsening  Patient-reported weight (lb):  297  Weight trend:  increasing steadily  Salt intake watch compared to last visit:  stable     ,

## 2016-12-27 NOTE — Telephone Encounter (Signed)
I agree with the Care Coordinator's Plan of Care

## 2016-12-28 ENCOUNTER — Encounter: Attending: Dermatology | Primary: Sports Medicine

## 2016-12-28 LAB — ANA: ANA: NEGATIVE

## 2016-12-30 LAB — DRUG PANEL-PM-HI RES-UR INTERP-A
6-Acetylmorphine: NOT DETECTED
7-aminoclonazepam: NOT DETECTED
Alprazolam: NOT DETECTED
Amphetamine: NOT DETECTED
Barbiturates: NOT DETECTED
Benzoylecgonine: NOT DETECTED
Buprenorphine: NOT DETECTED
CARISOPRODOL: NOT DETECTED
Clonazepam: NOT DETECTED
Codeine: NOT DETECTED
Creatinine, Ur: 109 mg/dL (ref 20.0–400.0)
Diazepam: NOT DETECTED
Ethyl Glucuronide: NOT DETECTED
Fentanyl: NOT DETECTED
Hydrocodone: NOT DETECTED
Hydromorphone: NOT DETECTED
Lorazepam: NOT DETECTED
MDA: NOT DETECTED
MDEA: NOT DETECTED
MDMA, Urine: NOT DETECTED
Marijuana Metabolite: NOT DETECTED
Meperidine: NOT DETECTED
Methadone: NOT DETECTED
Methamphetamine: NOT DETECTED
Methylphenidate: NOT DETECTED
Midazolam: NOT DETECTED
Morphine: NOT DETECTED
Norbuprenorphine: NOT DETECTED
Nordiazepam: NOT DETECTED
Norfentanyl: NOT DETECTED
Norhydrocodone, Urine: NOT DETECTED
Noroxymorphone, Urine: NOT DETECTED
OXAZEPAM: NOT DETECTED
Oxymorphone: NOT DETECTED
PCP: NOT DETECTED
Phentermine: NOT DETECTED
Propoxyphene: NOT DETECTED
TEMAZEPAM: NOT DETECTED
Tapentadol, Urine: NOT DETECTED
Tapentadol-O-Sulfate, Urine: NOT DETECTED
Tramadol: NOT DETECTED
Zolpidem: NOT DETECTED

## 2017-01-04 ENCOUNTER — Ambulatory Visit
Admit: 2017-01-04 | Discharge: 2017-01-04 | Payer: PRIVATE HEALTH INSURANCE | Attending: Plastic Surgery within the Head & Neck | Primary: Sports Medicine

## 2017-01-04 DIAGNOSIS — H608X3 Other otitis externa, bilateral: Secondary | ICD-10-CM

## 2017-01-04 MED ORDER — TRIAMCINOLONE ACETONIDE 0.1 % EX CREA
0.1 % | CUTANEOUS | 1 refills | Status: DC
Start: 2017-01-04 — End: 2019-11-01

## 2017-01-04 MED ORDER — METHYLPREDNISOLONE 4 MG PO TBPK
4 MG | PACK | ORAL | 0 refills | Status: DC
Start: 2017-01-04 — End: 2017-01-27

## 2017-01-04 MED ORDER — MUPIROCIN 2 % EX OINT
2 % | CUTANEOUS | 0 refills | Status: DC
Start: 2017-01-04 — End: 2019-11-01

## 2017-01-04 NOTE — Progress Notes (Signed)
Over the past week, the patient has had some pain in the left ear unassociated with drainage.  She had some decrease in hearing as well.  She tried some drops that she had at home but this did not result in any particular improvement.  She has had some slight discomfort in the right ear as well.  There has been no fever.  She has had some vertigo in the past with no significant increase.  She does not smoke.  Currently she appears to be in no obvious acute distress.  Examination of the uterus does reveal some chronic inflammation of both external canals.  The tympanic membranes on both sides appear to be normal.  The ears are cleaned carefully with peroxide and then painted with gentian violet.  The oral examination is unremarkable.  The neck is free of adenopathy, mass, thyroid enlargement.  The nasal mucosa appears normal as well.  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  She has also complained of some dryness and irritation in her nose likely related to some mild mucositis at the opening of each nares.  We will treat the nose with Bactroban to be used nightly.  The ears will be treated with triamcinolone cream twice daily.  We'll give her a course of Medrol Dosepak.  I have asked that she contact me in 1 week to determine her response.

## 2017-01-05 MED ORDER — MONTELUKAST SODIUM 10 MG PO TABS
10 MG | ORAL_TABLET | ORAL | 1 refills | Status: DC
Start: 2017-01-05 — End: 2017-02-16

## 2017-01-05 NOTE — Telephone Encounter (Signed)
Ok for this?

## 2017-01-08 NOTE — Care Coordination-Inpatient (Signed)
RNCC attempted call to patient- no answer. RNCC left message requesting call back.

## 2017-01-11 ENCOUNTER — Ambulatory Visit
Admit: 2017-01-11 | Discharge: 2017-01-11 | Payer: PRIVATE HEALTH INSURANCE | Attending: Cardiovascular Disease | Primary: Sports Medicine

## 2017-01-11 ENCOUNTER — Telehealth

## 2017-01-11 DIAGNOSIS — R0789 Other chest pain: Secondary | ICD-10-CM

## 2017-01-11 MED ORDER — TORSEMIDE 20 MG PO TABS
20 | ORAL_TABLET | Freq: Every day | ORAL | 3 refills | Status: DC
Start: 2017-01-11 — End: 2018-03-17

## 2017-01-11 MED ORDER — IMIPRAMINE HCL 10 MG PO TABS
10 MG | ORAL_TABLET | ORAL | 11 refills | Status: DC
Start: 2017-01-11 — End: 2017-01-11

## 2017-01-11 MED ORDER — IMIPRAMINE HCL 10 MG PO TABS
10 | ORAL_TABLET | ORAL | 11 refills | Status: DC
Start: 2017-01-11 — End: 2017-01-11

## 2017-01-11 MED ORDER — SPIRONOLACTONE 25 MG PO TABS
25 | ORAL_TABLET | Freq: Every day | ORAL | 3 refills | Status: DC
Start: 2017-01-11 — End: 2017-02-04

## 2017-01-11 MED ORDER — IMIPRAMINE HCL 25 MG PO TABS
25 | ORAL_TABLET | ORAL | 11 refills | Status: DC
Start: 2017-01-11 — End: 2017-01-14

## 2017-01-11 NOTE — Progress Notes (Signed)
[facility]  Advanced CHF/Pulmonary Hypertension   Cardiac       Rachael Mays  Date of Birth:  02/11/1956    Date of Visit:  01/11/17      Chief Complaint   Patient presents with   ??? Shortness of Breath     Ref by [facility]   ??? Dizziness   ??? Extremity Weakness     bilateral arms        History of Present Illness:  Rachael Mays is a 61 y.o. female who presents for a follow up. She has a past medical history significant for CAD/stent (03/13/16), dCHF, DVT/PE, s/p Greenfield filter, HTN, HLD, OSA on bipap, and obesity. She had been experiencing chronic chest pain and shortness of breath since April 2017. She underwent LHC (03/2016) that revealed significant stenosis of the LAD and a DES was placed. RHC was also performed which showed PA mean 33 mmHg, PCWP 18 mmHg (transpulmonary gradient 15), PVR 2.134 Wood units.  Symptoms did not improved post stent. She was seen by Dr Jeb Levering and had a  CTA/CTPA (03/2016) with no evidence of PE or acute abnormality.An echo 04/06/16 with concentric LVH, EF 65% and RVSP 42 mmHg. Repeat LHC (07/2016) for shortness of breath and chest pain demonstrated patent stent LAD. She presented to ER 09/01/2016 after presenting with shortness of breath, chest heaviness and weight gain, workup was non-revealing.  She has been seen by Dr. Diana Eves, pain management.  She was hospitalized 06/16/16-06/18/16 for chest pain. She was hospitalized in August 2017 with chest pain and shortness of breath.  She ruled out for MI.  She underwent medication adjustment which helped some.  She was seen recent in the ER on 12/20/16 with chest pain.  She has had chest pain that is left sided, sharp in nature, no reproducible/identifiable trigger.  She also has episodes of chest discomfort described as heaviness.  NTG has helped in the past but not with 12/20/16 chest pain episode.  She continues to follow with Dr. Diana Eves for pain management.    Today, she reports that she is still having swelling, and recurrent sharp  chest pain that radiated to her arm and it becomes numb. She has been seen by Dr. Susy Manor and has done [facility] above note states). She is s/p stent. No changed in medications, except she takes 1 torsemide daily was decreased from 2 tablets a day. In addition to heart medications she takes Effexor for her depression. She gets sob with the chest pain but feels both (CP and SOB) occurs at the same time.y both She denies dizziness or palpitations.         Allergies   Allergen Reactions   ??? Morphine Itching   ??? Seasonal    ??? Talwin [Pentazocine] Other (See Comments)     dizzy     Current Outpatient Prescriptions   Medication Sig Dispense Refill   ??? montelukast (SINGULAIR) 10 MG tablet TAKE ONE TABLET BY MOUTH ONE TIME A DAY 90 tablet 1   ??? triamcinolone (KENALOG) 0.1 % cream Apply topically 2 times daily.Apply with Q tip in each ear BID for 1 week. 15 g 1   ??? mupirocin (BACTROBAN) 2 % ointment Apply topically  Daily.Apply with Q tip in each naris at night 15 g 0   ??? torsemide (DEMADEX) 20 MG tablet Take 2 tablets by mouth daily 20 tablet 0   ??? spironolactone (ALDACTONE) 25 MG tablet Take 1 tablet by mouth daily  30 tablet 0   ??? traZODone (DESYREL) 100 MG tablet TAKE ONE TABLET BY MOUTH NIGHTLY 90 tablet 1   ??? venlafaxine (EFFEXOR XR) 150 MG extended release capsule Take 1 capsule by mouth daily 90 capsule 3   ??? fluocinonide (LIDEX) 0.05 % external solution Apply sparingly to the back of the scalp daily if needed for itching. 60 mL 1   ??? metoprolol (LOPRESSOR) 100 MG tablet Take 0.5 tablets by mouth 2 times daily 270 tablet 1   ??? ALPRAZolam (XANAX) 0.25 MG tablet Take 1 tablet by mouth 2 times daily as needed for Anxiety 60 tablet 0   ??? lisinopril (PRINIVIL;ZESTRIL) 40 MG tablet Take 1 tablet by mouth daily Indications: Patient thought this was dc'd in December by PCP (Patient taking differently: Take 40 mg by mouth daily ) 90 tablet 3   ??? omeprazole (PRILOSEC) 40 MG delayed release capsule Take 1 capsule by mouth daily 90  capsule 3   ??? oxyCODONE-acetaminophen (PERCOCET) 5-325 MG per tablet Take 1 tablet by mouth every 6 hours as needed for Pain . 40 tablet 0   ??? nitroGLYCERIN (NITROSTAT) 0.4 MG SL tablet up to max of 3 total doses. If no relief after 1 dose, call 911. 25 tablet 3   ??? aspirin 81 MG EC tablet Take 1 tablet by mouth daily 30 tablet 11   ??? prasugrel (EFFIENT) 10 MG TABS Take 1 tablet by mouth daily 90 tablet 3   ??? albuterol sulfate HFA (PROVENTIL HFA) 108 (90 BASE) MCG/ACT inhaler Inhale 2 puffs into the lungs every 6 hours as needed for Wheezing 3 Inhaler 3   ??? ondansetron (ZOFRAN) 4 MG tablet Take 1 tablet by mouth daily as needed for Nausea or Vomiting 30 tablet 1   ??? Multiple Vitamins-Minerals (MULTIVITAMIN PO) Take 1 tablet by mouth daily      ??? CALCIUM-VITAMIN D PO Take 1 tablet by mouth daily      ??? cetirizine (ZYRTEC ALLERGY) 10 MG tablet Take 10 mg by mouth daily.     ??? methylPREDNISolone (MEDROL, PAK,) 4 MG tablet Take 1 tablet by mouth See Admin Instructions Take by mouth. 1 kit 0   ??? Handicap Placard MISC by Does not apply route DX: chronic diastolic heart failure, dyspnea on exertion    Duration: 1 year 1 each 0   ??? venlafaxine (EFFEXOR XR) 75 MG extended release capsule Take 2 capsules by mouth daily (Patient taking differently: Take 75 mg by mouth daily ) 90 capsule 3   ??? nystatin (MYCOSTATIN) 100000 UNIT/GM cream Apply topically 2 times daily. 30 g 2   ??? Blood Pressure Monitor KIT Use daily as needed 1 kit 0     No current facility-administered medications for this visit.        Past Medical History:   Diagnosis Date   ??? Anxiety    ??? Asthma    ??? Blood circulation, collateral    ??? CAD (coronary artery disease)    ??? CHF (congestive heart failure) ([place])    ??? Chronic back pain    ??? Deep vein thrombosis    ??? Depression    ??? GERD (gastroesophageal reflux disease)     NO LONGER SINCE LAP   ??? GERD (gastroesophageal reflux disease) 01/23/2009   ??? Hx of blood clots    ??? Hyperlipidemia     hx; resolved with lap band    ??? Hypertension    ??? Obesity     hx of; had  lap band   ??? Obstructive sleep apnea (adult) (pediatric) 09/18/2015   ??? Pulmonary embolism [facility])      Past Surgical History:   Procedure Laterality Date   ??? BACK SURGERY      neck  plates   ??? BREAST SURGERY      left lumpectomy   ??? CESAREAN SECTION     ??? CHOLECYSTECTOMY     ??? COLONOSCOPY     ??? COLONOSCOPY  04/08/10   ??? CORONARY ANGIOPLASTY     ??? DIAGNOSTIC CARDIAC CATH LAB PROCEDURE     ??? DILATATION, ESOPHAGUS     ??? ENDOSCOPY, COLON, DIAGNOSTIC     ??? HYSTERECTOMY     ??? LAP BAND  05/01/08    Dr. Nicole Cella   ??? OTHER SURGICAL HISTORY      Greenfield Filter: curently in place   ??? OTHER SURGICAL HISTORY  07/05/13    lap band removal   ??? PARTIAL HYSTERECTOMY     ??? SHOULDER SURGERY     ??? UPPER GASTROINTESTINAL ENDOSCOPY  05/19/13   ??? UPPER GASTROINTESTINAL ENDOSCOPY  07/21/13    ESOPHAGEAL STENT PLACEMENT   ??? UPPER GASTROINTESTINAL ENDOSCOPY N/A 07/31/2014    Esophagogastroduodenoscopy with esophageal balloon dilation   ??? VASCULAR SURGERY  04/2015    Lucendia Herrlich, celiac artery angiogram, normal abd arteries     Family History   Problem Relation Age of Onset   ??? Arthritis Mother    ??? Cancer Mother    ??? Depression Mother    ??? High Blood Pressure Mother    ??? Heart Disease Mother 28     MI    ??? Ovarian Cancer Mother 42   ??? Cancer Father 2     colon   ??? Heart Disease Maternal Grandmother    ??? Early Death Paternal Grandmother    ??? Heart Disease Paternal Grandfather    ??? Diabetes Other    ??? High Blood Pressure Other    ??? Obesity Other    ??? Other Sister      OSA   ??? Other Brother      OSA   ??? Other Brother      OSA   ??? Other Daughter      OSA     Social History     Social History   ??? Marital status: Married     Spouse name: [name]   ??? Number of children: 2   ??? Years of education: 12     Occupational History   ??? Not on file.     Social History Main Topics   ??? Smoking status: Former Smoker     Packs/day: 0.50     Years: 40.00     Types: Cigarettes     Quit date: 08/07/2012   ??? Smokeless tobacco: Former  Systems developer     Quit date: 06/16/2013      Comment: started to smoke at age 88 / only smoked 0.5 p.p.d    ??? Alcohol use No   ??? Drug use: No   ??? Sexual activity: No     Other Topics Concern   ??? Not on file     Social History Narrative    ** Merged History Encounter **            Review of Systems:   ?? Constitutional: there has been no unanticipated weight loss. There's been no change in energy level, sleep pattern, or activity level.     ?? Eyes:  No visual changes or diplopia. No scleral icterus.  ?? ENT: No Headaches, hearing loss or vertigo. No mouth sores or sore throat.  ?? Cardiovascular: Reviewed in HPI  ?? Respiratory: No cough or wheezing, no sputum production. No hematemesis.    ?? Gastrointestinal: No abdominal pain, appetite loss, blood in stools. No change in bowel or bladder habits.  ?? Genitourinary: No dysuria, trouble voiding, or hematuria.  ?? Musculoskeletal:  No gait disturbance, weakness or joint complaints.  ?? Integumentary: No rash or pruritis.  ?? Neurological: No headache, diplopia, change in muscle strength, numbness or tingling. No change in gait, balance, coordination, mood, affect, memory, mentation, behavior.  ?? Psychiatric: No anxiety, no depression.  ?? Endocrine: No malaise, fatigue or temperature intolerance. No excessive thirst, fluid intake, or urination. No tremor.  ?? Hematologic/Lymphatic: No abnormal bruising or bleeding, blood clots or swollen lymph nodes.  ?? Allergic/Immunologic: No nasal congestion or hives.    Physical Examination:    Vitals:    01/11/17 1148   BP: 110/70   Pulse: 64   Weight: (!) 301 lb (136.5 kg)   Height: '5\' 6"'  (1.676 m)     Body mass index is 48.58 kg/m??.     Wt Readings from Last 3 Encounters:   01/11/17 (!) 301 lb (136.5 kg)   12/25/16 298 lb (135.2 kg)   12/20/16 292 lb (132.5 kg)     BP Readings from Last 3 Encounters:   01/11/17 110/70   01/04/17 128/80   12/25/16 110/70     Constitutional and General Appearance:   WD/WN in NAD  HEENT:  NC/AT  PERLA  No problems with  hearing  Skin:  Warm, dry  Respiratory:  ?? Normal excursion and expansion without use of accessory muscles  ?? Resp Auscultation: Normal breath sounds without dullness  Cardiovascular:  ?? The apical impulses not displaced  ?? Heart tones are crisp and normal  ?? Cervical veins are not engorged  ?? The carotid upstroke is normal in amplitude and contour without delay or bruit  ?? JVP less than 8 cm H2O  RRR with nl S1 and S2 without m,r,g  ?? Peripheral pulses are symmetrical and full  ?? There is no clubbing, cyanosis of the extremities.  ?? No edema  ?? Femoral Arteries: 2+ and equal  ?? Pedal Pulses: 2+ and equal   Neck:  ?? No thyromegaly  Abdomen:  ?? No masses or tenderness  ?? Liver/Spleen: No Abnormalities Noted  Neurological/Psychiatric:  ?? Alert and oriented in all spheres  ?? Moves all extremities well  ?? Exhibits normal gait balance and coordination  ?? No abnormalities of mood, affect, memory, mentation, or behavior are noted    Assessment:    1. Shortness of breath--stable.    2. Chronic diastolic congestive heart failure ([place])-- compensated on exam. Taking on torsemide, metoprolol, lisinopril and aldactone.   3. Coronary artery disease due to lipid rich plaque-- Negative cath s/p stent. On effient.   4. Essential hypertension--stable.    5. Angina --recurrent. Negative cardiac testing.\\  6. SOB--associated with CP and discomfort.  7.  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  I think she may have small vessel disease.  There is data on using imipramine in women with chronic chest pain (Syndrome X)    Plan:  Start imipramine 25 mg #30, take 1 tablet at night time   I discussed with Dr. Murrell Converse  who thinks it is okay to use low dose imipramine with Effexor.    Continue all other medications.  RTO in 6-8 weeks.    QUALITY MEASURES  1. Tobacco Cessation Counseling:   2. Retake of BP if >140/90:     3. Documentation to PCP/referring for new patient:  Sent to PCP at  close of office visit  4. CAD patient on anti-platelet: Yes  5. CAD patient on STATIN therapy:  Yes  6. Patient with CHF and aFib on anticoagulation: NA       I appreciate the opportunity of cooperating in the care of this patient.    Marissa Nestle, M.D., [facility]

## 2017-01-11 NOTE — Patient Instructions (Addendum)
Start imipramine 25 mg #30, take 1 tablet at night time and will call to say when to start.    Continue all other medications.  I will discuss her taking imipramine  with Dr. Murrell Converse before she starts it.  RTO in 6-8 weeks.

## 2017-01-11 NOTE — Telephone Encounter (Addendum)
I spoke with patient and she was told that Dr. Murrell Converse is okay with her starting imipramine and she is to continue the Effexor as well. We reviewed  imipramine 25 mg dose to take  nightly. Escribed to [employer] order. She verbalizes understanding.

## 2017-01-14 ENCOUNTER — Telehealth

## 2017-01-14 MED ORDER — IMIPRAMINE HCL 25 MG PO TABS
25 | ORAL_TABLET | ORAL | 3 refills | Status: DC
Start: 2017-01-14 — End: 2017-01-15

## 2017-01-14 NOTE — Telephone Encounter (Signed)
Done. New rx sent to [facility].

## 2017-01-14 NOTE — Telephone Encounter (Signed)
Rachael Mays has question about dose of Imipramine that LEW wrote rx for . Please call to clarify

## 2017-01-14 NOTE — Telephone Encounter (Signed)
Order for imipramine escribed to [facility]

## 2017-01-15 ENCOUNTER — Telehealth

## 2017-01-15 MED ORDER — IMIPRAMINE HCL 25 MG PO TABS
25 | ORAL_TABLET | ORAL | 3 refills | Status: DC
Start: 2017-01-15 — End: 2017-03-26

## 2017-01-15 NOTE — Telephone Encounter (Signed)
Pt is asking for refill of ibuprofen 800mg  - Dr Diana Eves had advised pt to take for her back (OTC) but it is not helping and his office is closed    Is there any way she could get this sent to     [facility] on [address] in chart    Please call and advise

## 2017-01-15 NOTE — Telephone Encounter (Signed)
Please advise

## 2017-01-15 NOTE — Telephone Encounter (Signed)
This is OTC.  Just take 4 of the OTC 200mg  tabs.

## 2017-01-15 NOTE — Telephone Encounter (Signed)
Rachael Mays spoke with [name] at [facility] pharmacy and Imipramine was clarified as it was escribed to [facility] and not this pharmacy for a 30 day trial to see if patient tolerates it before it is sent to mail order. He verbalizes understanding.

## 2017-01-16 NOTE — Telephone Encounter (Signed)
Left message for patient to call back.

## 2017-01-18 NOTE — Telephone Encounter (Signed)
Patient advised.

## 2017-01-18 NOTE — Telephone Encounter (Signed)
Left message for patient to call back.

## 2017-01-19 NOTE — Telephone Encounter (Signed)
Wt is 297.6 today which is a 3# weight gain since yesterday.  Weight gain seems to to be constant for one week.  SOB is constant.  Pt is propped on two pillows which is constant.  Pt has bloating in stomach.  Edema in calves and some in ankles.    Pt currently taking Torsemide 20mg  QD

## 2017-01-19 NOTE — Telephone Encounter (Signed)
Is she drinking more since starting the antidepressant?  She can take an extra torsemide 20 mg and see if she looses some weight  Remind her only to drink <64 ounces of fluid a day

## 2017-01-19 NOTE — Telephone Encounter (Signed)
Pt calling to let lew know she has gained 3lbs overnight and has been sob since yesterday. Wants to know what should she do? Please call to advise,Tahnk You!

## 2017-01-19 NOTE — Telephone Encounter (Signed)
I contacted the patient and relayed instructions from [facility].  She stated she drinks about 48 oz of fluid/day and has been watching her sodium intake.   She will take the extra 20 mg of torsemide and see if that reduces weight.  Will let us know if not effective.

## 2017-01-20 LAB — BASIC METABOLIC PANEL
Anion Gap: 12 (ref 3–16)
BUN: 17 mg/dL (ref 7–20)
CO2: 26 mmol/L (ref 21–32)
Calcium: 9.4 mg/dL (ref 8.3–10.6)
Chloride: 106 mmol/L (ref 99–110)
Creatinine: 1.1 mg/dL (ref 0.6–1.1)
GFR African American: >60 (ref >60)
GFR Non-African American: 51 — AB (ref >60)
Glucose: 118 mg/dL — ABNORMAL HIGH (ref 70–99)
Potassium: 4.8 mmol/L (ref 3.5–5.1)
Sodium: 144 mmol/L (ref 136–145)

## 2017-01-20 NOTE — Telephone Encounter (Signed)
LVM with results for patient.  (OK per HIPAA)

## 2017-01-20 NOTE — Telephone Encounter (Signed)
-----   Message from Dorian Furnace, CNP sent at 01/19/2017 10:15 PM EST -----  Notify patient labs show stable kidney function. Continue current medications. Thanks.

## 2017-01-21 ENCOUNTER — Emergency Department: Admit: 2017-01-22 | Primary: Sports Medicine
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
## 2017-01-22 ENCOUNTER — Inpatient Hospital Stay: Admit: 2017-01-22 | Discharge: 2017-01-22 | Disposition: A | Discharge status: Home / Self Care

## 2017-01-22 MED ORDER — HYDROCODONE-ACETAMINOPHEN 5-325 MG PO TABS
5-325 MG | Freq: Once | ORAL | Status: AC
Start: 2017-01-22 — End: 2017-01-21
  Administered 2017-01-22: 04:00:00 1 via ORAL

## 2017-01-22 MED ORDER — CYCLOBENZAPRINE HCL 10 MG PO TABS
10 MG | ORAL_TABLET | Freq: Three times a day (TID) | ORAL | 0 refills | Status: AC | PRN
Start: 2017-01-22 — End: 2017-01-31

## 2017-01-22 MED ORDER — IBUPROFEN 800 MG PO TABS
800 MG | Freq: Once | ORAL | Status: AC
Start: 2017-01-22 — End: 2017-01-21
  Administered 2017-01-22: 04:00:00 800 mg via ORAL

## 2017-01-22 MED ORDER — CYCLOBENZAPRINE HCL 10 MG PO TABS
10 MG | Freq: Once | ORAL | Status: AC
Start: 2017-01-22 — End: 2017-01-21
  Administered 2017-01-22: 04:00:00 10 mg via ORAL

## 2017-01-22 MED FILL — HYDROCODONE-ACETAMINOPHEN 5-325 MG PO TABS: 5-325 MG | ORAL | Qty: 1

## 2017-01-22 MED FILL — IBUPROFEN 800 MG PO TABS: 800 MG | ORAL | Qty: 1

## 2017-01-22 MED FILL — CYCLOBENZAPRINE HCL 10 MG PO TABS: 10 MG | ORAL | Qty: 1

## 2017-01-22 NOTE — ED Notes (Signed)
Discharge instructions given, patient acknowledged understanding, rx given x1, patient ambulated out of ed upon discharge with no wants or needs      Jasper Riling, RN  01/22/17 0001

## 2017-01-22 NOTE — Care Coordination-Inpatient (Signed)
RNCC placed call to patient to follow up on ED visit- no answer. Message left and call back requested.

## 2017-01-24 ENCOUNTER — Encounter

## 2017-01-27 ENCOUNTER — Ambulatory Visit
Admit: 2017-01-27 | Discharge: 2017-01-27 | Payer: PRIVATE HEALTH INSURANCE | Attending: Family Medicine | Primary: Sports Medicine

## 2017-01-27 DIAGNOSIS — M778 Other enthesopathies, not elsewhere classified: Secondary | ICD-10-CM

## 2017-01-27 MED ORDER — ALPRAZOLAM 0.25 MG PO TABS
0.25 | ORAL_TABLET | Freq: Two times a day (BID) | ORAL | 2 refills | Status: AC | PRN
Start: 2017-01-27 — End: 2017-02-26

## 2017-01-27 MED ORDER — WRIST SPLINT/COCK-UP/LEFT M MISC
Freq: Every day | 0 refills | Status: DC | PRN
Start: 2017-01-27 — End: 2017-04-29
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
## 2017-01-28 ENCOUNTER — Encounter

## 2017-01-28 MED ORDER — TRAZODONE HCL 100 MG PO TABS
100 | ORAL_TABLET | ORAL | 3 refills | Status: DC
Start: 2017-01-28 — End: 2017-02-11

## 2017-01-28 MED ORDER — VENLAFAXINE HCL ER 150 MG PO CP24
150 | ORAL_CAPSULE | Freq: Every day | ORAL | 3 refills | Status: DC
Start: 2017-01-28 — End: 2017-02-16

## 2017-01-28 NOTE — Telephone Encounter (Signed)
venlafaxine (EFFEXOR XR) 150 MG extended release capsule 90 capsule 3 07/27/2016     Sig - Route: Take 1 capsule by mouth daily - Oral      traZODone (DESYREL) 100 MG tablet 90 tablet 1 08/03/2016     Sig: TAKE ONE TABLET BY MOUTH NIGHTLY      Pt is requesting the following meds above, She has a OV on 3/8 and is wanting enough meds to cover for until her appointment.     [facility] [employer]

## 2017-01-29 ENCOUNTER — Telehealth

## 2017-01-29 NOTE — Telephone Encounter (Signed)
I spoke to pt and gave instructions.

## 2017-01-29 NOTE — Telephone Encounter (Signed)
Have her increase torsemide 40 mg twice a day over the weekend.  Let us know how she is doing on Monday.    Will need to get a bmp and bnp and cbc early next week.  LEW

## 2017-01-29 NOTE — Telephone Encounter (Signed)
Please advise.

## 2017-01-29 NOTE — Telephone Encounter (Addendum)
Pt calling to adv that she had a 4lb weight gain in the last 2 days and she is having sob. Pt adv she took her water pill this morning and has only used the bathroom twice today.Pt would like to know what to do, Please call to adv thank you

## 2017-02-01 ENCOUNTER — Ambulatory Visit: Admit: 2017-02-02 | Primary: Sports Medicine

## 2017-02-01 ENCOUNTER — Encounter

## 2017-02-01 DIAGNOSIS — M79642 Pain in left hand: Secondary | ICD-10-CM

## 2017-02-01 NOTE — Telephone Encounter (Signed)
Pt called to report she still has edema in both legs and abdomin and hand.    Pt is not sob today.  Pt took the increased dosage of torsemide (DEMADEX) as instructed.    Having labs done today 02/01/17    Pt has been unable to get the edema to go down.    Please call to adv.  Thank you CSF

## 2017-02-01 NOTE — Telephone Encounter (Signed)
Xray left hand and wrist.

## 2017-02-01 NOTE — Telephone Encounter (Signed)
Patient has been informed.
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
## 2017-02-01 NOTE — Telephone Encounter (Signed)
Please advise

## 2017-02-02 ENCOUNTER — Encounter

## 2017-02-02 ENCOUNTER — Inpatient Hospital Stay: Admit: 2017-02-02 | Discharge: 2017-02-02

## 2017-02-02 ENCOUNTER — Inpatient Hospital Stay: Attending: Family Medicine | Primary: Sports Medicine

## 2017-02-02 LAB — BASIC METABOLIC PANEL
Anion Gap: 11 (ref 3–16)
BUN: 25 mg/dL — ABNORMAL HIGH (ref 7–20)
CO2: 30 mmol/L (ref 21–32)
Calcium: 9.8 mg/dL (ref 8.3–10.6)
Chloride: 103 mmol/L (ref 99–110)
Creatinine: 1.2 mg/dL — ABNORMAL HIGH (ref 0.6–1.1)
GFR African American: 56 — AB (ref >60)
GFR Non-African American: 46 — AB (ref >60)
Glucose: 114 mg/dL — ABNORMAL HIGH (ref 70–99)
Potassium: 4 mmol/L (ref 3.5–5.1)
Sodium: 144 mmol/L (ref 136–145)

## 2017-02-02 MED ORDER — FLUOCINONIDE 0.05 % EX SOLN
0.05 % | CUTANEOUS | 1 refills | Status: DC
Start: 2017-02-02 — End: 2017-02-16

## 2017-02-02 NOTE — Telephone Encounter (Signed)
Needs to be seen.  See if CHF NP can see.  LEW

## 2017-02-02 NOTE — Telephone Encounter (Signed)
Rachael Mays, [place] [facility] Staff    Notify patient labs show stable renal function. Continue current medications. Recheck labs in one month (BMP). Orders placed.     Called patient and left message, per HIPPA, relayed message

## 2017-02-03 NOTE — Telephone Encounter (Signed)
[name] is in [place].  Patient will need to call to make appointment.  If [facility] can not get her in, she will need to call us back so we can try an alternative.

## 2017-02-03 NOTE — Telephone Encounter (Signed)
Pt will be seen by [facility] tomorrow (02/04/17)

## 2017-02-03 NOTE — Telephone Encounter (Signed)
Patient advised.

## 2017-02-03 NOTE — Telephone Encounter (Signed)
Pt is calling to request a referral to a orthopetic doctor for closed fracture of left wrist. Dr. Ethlyn Gallery suggested her see one and she doesn't know who to go to.     Please call and advise.   [phone]

## 2017-02-04 ENCOUNTER — Ambulatory Visit
Admit: 2017-02-04 | Discharge: 2017-02-04 | Payer: PRIVATE HEALTH INSURANCE | Attending: Hand Surgery | Primary: Sports Medicine

## 2017-02-04 ENCOUNTER — Ambulatory Visit
Admit: 2017-02-04 | Discharge: 2017-02-04 | Payer: PRIVATE HEALTH INSURANCE | Attending: Acute Care | Primary: Sports Medicine

## 2017-02-04 DIAGNOSIS — S63521A Sprain of radiocarpal joint of right wrist, initial encounter: Secondary | ICD-10-CM

## 2017-02-04 DIAGNOSIS — I5032 Chronic diastolic (congestive) heart failure: Secondary | ICD-10-CM

## 2017-02-04 MED ORDER — SPIRONOLACTONE 25 MG PO TABS
25 | ORAL_TABLET | Freq: Every day | ORAL | 3 refills | Status: DC
Start: 2017-02-04 — End: 2017-02-16

## 2017-02-04 NOTE — Progress Notes (Addendum)
[place] - Cardiology      Chief Complaint: "swelling"    Chief Complaint   Patient presents with   ??? Check-Up     Denies chest pain   ??? Edema     abdomen, legs   ??? Congestive Heart Failure   ??? Coronary Artery Disease   ??? Hypertension   ??? Shortness of Breath     with any exertion   ??? Dizziness   ??? Fatigue       History of present illness: Rachael Mays is a 61 y.o. female with past medical history significant for CAD/stent (03/13/16), dCHF, DVT/PE, s/p Greenfield filter, HTN, HLD, OSA on bipap, and obesity. She has been experiencing chronic chest pain and shortness of breath since April 2017. She underwent LHC (03/2016) that revealed significant stenosis of the LAD and a DES was placed. RHC was also performed which showed PA mean 33 mmHg, PCWP 18 mmHg (transpulmonary gradient 15), PVR 2.134 Wood units.  Symptoms have not improved post stent. She has seen Dr Jeb Levering and CTA/CTPA (03/2016) with no evidence of PE or acute abnormality. Echo 04/06/16 with concentric LVH, EF 65% and RVSP 42 mmHg. Repeat LHC (07/2016) for shortness of breath and chest pain demonstrated patent stent LAD. She was seen in office 01/11/2017 by Dr Jacqualyn Posey and imipramine was to start for chronic chest pain.            Patient presents today for follow up dCHF. She continues to experience chronic chest discomfort to varying degrees and exertional shortness of breath. She reports weight gain and swelling despite taking torsemide 40 mg BID over the weekend. She c/o swelling in face, hands and bloating in abdomen. She is short of breath with minimal exertion including ADLs. Denies orthopnea or PND. Compliant with bipap. No palpitations or lightheadedness.       Past Medical History:   Diagnosis Date   ??? Anxiety    ??? Asthma    ??? Blood circulation, collateral    ??? CAD (coronary artery disease)    ??? CHF (congestive heart failure) ([place])    ??? Chronic back pain    ??? Deep vein thrombosis    ??? Depression    ??? GERD  (gastroesophageal reflux disease)     NO LONGER SINCE LAP   ??? GERD (gastroesophageal reflux disease) 01/23/2009   ??? Hx of blood clots    ??? Hyperlipidemia     hx; resolved with lap band   ??? Hypertension    ??? Obesity     hx of; had lap band   ??? Obstructive sleep apnea (adult) (pediatric) 09/18/2015   ??? Pulmonary embolism [facility])      Past Surgical History:   Procedure Laterality Date   ??? BACK SURGERY      neck  plates   ??? BREAST SURGERY      left lumpectomy   ??? CESAREAN SECTION     ??? CHOLECYSTECTOMY     ??? COLONOSCOPY     ??? COLONOSCOPY  04/08/10   ??? CORONARY ANGIOPLASTY     ??? DIAGNOSTIC CARDIAC CATH LAB PROCEDURE     ??? DILATATION, ESOPHAGUS     ??? ENDOSCOPY, COLON, DIAGNOSTIC     ??? HYSTERECTOMY     ??? LAP BAND  05/01/08    Dr. Nicole Cella   ??? OTHER SURGICAL HISTORY      Greenfield Filter: curently in place   ??? OTHER SURGICAL HISTORY  07/05/13  lap band removal   ??? PARTIAL HYSTERECTOMY     ??? SHOULDER SURGERY     ??? UPPER GASTROINTESTINAL ENDOSCOPY  05/19/13   ??? UPPER GASTROINTESTINAL ENDOSCOPY  07/21/13    ESOPHAGEAL STENT PLACEMENT   ??? UPPER GASTROINTESTINAL ENDOSCOPY N/A 07/31/2014    Esophagogastroduodenoscopy with esophageal balloon dilation   ??? VASCULAR SURGERY  04/2015    Lucendia Herrlich, celiac artery angiogram, normal abd arteries     Social History   Substance Use Topics   ??? Smoking status: Former Smoker     Packs/day: 0.50     Years: 40.00     Types: Cigarettes     Quit date: 08/07/2012   ??? Smokeless tobacco: Former Systems developer     Quit date: 06/16/2013      Comment: started to smoke at age 59 / only smoked 0.5 p.p.d    ??? Alcohol use No     Review of Systems:   Constitutional: + weight gain, + fatigue   HEENT: No change in vision or ringing in the ears.  Respiratory: + DOE, no PND, orthopnea or cough.   Cardiovascular: See HPI + chest pressure (chronic), + edema (face/hands/feet)  GI: No n/v, abdominal pain or changes in bowel habits.  No melena, no hematochezia + abdominal bloating  GU: No dysuria or hematuria.  Skin: No rash or new skin  lesions.  Musculoskeletal: No new muscle or joint pain.  Neurological: No lightheadedness, dizziness, syncope or TIA-like symptoms.  Psychiatric: No anxiety, insomnia or depression    Physical Exam:  BP 136/66 (Site: Left Arm, Position: Sitting, Cuff Size: Large Adult)    Pulse 60    Resp 16    Ht 5\' 6"  (1.676 m)    Wt (!) 309 lb 6.4 oz (140.3 kg)    SpO2 97%    BMI 49.94 kg/m??     General:  Awake, alert, oriented in NAD  Skin:  Warm and dry.  No unusual bruising or rash  HEENT: Normocephalic and atraumatic. Oral mucosa moist, no cyanosis.  Neck:  Supple.  No JVP appreciated but difficult to assess  Chest:  Normal effort.  Clear to auscultation  Cardiovascular:  RRR, S1/S2, no murmur/gallop/rub  Abdomen:  Soft, nontender, +bowel sounds  Extremities:  1+ BLE edema   Neurological: No focal deficits  Psychological: Normal mood and affect      Current Outpatient Prescriptions   Medication Sig Dispense Refill   ??? fluocinonide (LIDEX) 0.05 % external solution APPLY SPARINGLY TO THE BACK OF THE SCALP DAILY IF NEEDED FOR ITCHING 60 mL 1   ??? venlafaxine (EFFEXOR XR) 150 MG extended release capsule Take 1 capsule by mouth daily 90 capsule 3   ??? traZODone (DESYREL) 100 MG tablet TAKE ONE TABLET BY MOUTH NIGHTLY 90 tablet 3   ??? ALPRAZolam (XANAX) 0.25 MG tablet Take 1 tablet by mouth 2 times daily as needed for Anxiety for up to 30 days. 60 tablet 2   ??? Elastic Bandages & Supports (WRIST SPLINT/COCK-UP/LEFT M) MISC 1 Device by Does not apply route daily as needed (wrist pain) 1 each 0   ??? imipramine (TOFRANIL) 25 MG tablet Take 1 tablet daily 90 tablet 3   ??? torsemide (DEMADEX) 20 MG tablet Take 2 tablets by mouth daily 180 tablet 3   ??? spironolactone (ALDACTONE) 25 MG tablet Take 1 tablet by mouth daily 90 tablet 3   ??? montelukast (SINGULAIR) 10 MG tablet TAKE ONE TABLET BY MOUTH ONE TIME A DAY 90 tablet  1   ??? triamcinolone (KENALOG) 0.1 % cream Apply topically 2 times daily.Apply with Q tip in each ear BID for 1 week. 15 g 1    ??? mupirocin (BACTROBAN) 2 % ointment Apply topically  Daily.Apply with Q tip in each naris at night 15 g 0   ??? metoprolol (LOPRESSOR) 100 MG tablet Take 0.5 tablets by mouth 2 times daily 270 tablet 1   ??? nystatin (MYCOSTATIN) 100000 UNIT/GM cream Apply topically 2 times daily. 30 g 2   ??? lisinopril (PRINIVIL;ZESTRIL) 40 MG tablet Take 1 tablet by mouth daily Indications: Patient thought this was dc'd in December by PCP (Patient taking differently: Take 40 mg by mouth daily ) 90 tablet 3   ??? omeprazole (PRILOSEC) 40 MG delayed release capsule Take 1 capsule by mouth daily 90 capsule 3   ??? oxyCODONE-acetaminophen (PERCOCET) 5-325 MG per tablet Take 1 tablet by mouth every 6 hours as needed for Pain . 40 tablet 0   ??? nitroGLYCERIN (NITROSTAT) 0.4 MG SL tablet up to max of 3 total doses. If no relief after 1 dose, call 911. 25 tablet 3   ??? aspirin 81 MG EC tablet Take 1 tablet by mouth daily 30 tablet 11   ??? prasugrel (EFFIENT) 10 MG TABS Take 1 tablet by mouth daily 90 tablet 3   ??? albuterol sulfate HFA (PROVENTIL HFA) 108 (90 BASE) MCG/ACT inhaler Inhale 2 puffs into the lungs every 6 hours as needed for Wheezing 3 Inhaler 3   ??? Multiple Vitamins-Minerals (MULTIVITAMIN PO) Take 1 tablet by mouth daily      ??? CALCIUM-VITAMIN D PO Take 1 tablet by mouth daily      ??? cetirizine (ZYRTEC ALLERGY) 10 MG tablet Take 10 mg by mouth daily.       No current facility-administered medications for this visit.        Labs:   Lab Results   Component Value Date    WBC 7.0 12/20/2016    HGB 13.5 12/20/2016    HCT 39.8 12/20/2016    MCV 88.7 12/20/2016    PLT 197 12/20/2016     Lab Results   Component Value Date    NA 144 02/01/2017    K 4.0 02/01/2017    CL 103 02/01/2017    CO2 30 02/01/2017    BUN 25 02/01/2017    CREATININE 1.2 02/01/2017    GLUCOSE 114 02/01/2017    CALCIUM 9.8 02/01/2017      Lab Results   Component Value Date    TRIG 114 06/17/2016    HDL 43 06/17/2016    HDL 52 03/31/2011    LDLCALC 52 06/17/2016    LABVLDL  23 06/17/2016       Diagnostics:    Echo 04/06/16:  Summary   Normal left ventricle size and systolic function with an estimated ejection   fraction of 65%.   There is mild concentric left ventricular hypertrophy.   Mild mitral regurgitation is present.   The aortic valve appears sclerotic but opens well.   There is mild tricuspid regurgitation with RVSP estimated at 42 mmHg.   Mild pulmonic regurgitation present.   Definity was used to better delineate endocardium.    Cardiac Cath 07/17/16:  Anatomy:   LM-normal  LAD-widely patent stent otherwise normal  Cx-normal, dominant  OM1- normal  RCA-normal  LVEF- 70%, LVEDP 15 mmHg  ??  Impression:  1.  Widely patent LAD stent otherwise normal coronary arteries  2.  Hyperdynamic LV systolic function  3.  Non-cardiac chest pain     R/L Cardiac Cath 03/31/16:  Anatomy:   LM-normal   LAD-70% mid with FFR 0.77  Cx-normal, codominant  OM1- normal  RCA-normal  RPDA- normal  LVEF- 70%, LVEDP 19   PCI: LAD 70% to 0% with 3.25 mm x 18 mm Xience Alpine drug-eluting stent     Hemodynamics:  RA-15/12 mean 9  RV- 60/0, 9  PAWP-20/26 mean 19  PA- 60/12 mean 33     Assessment:  1. Chronic dCHF. NYHA class III. Appears compensated. On ACE, beta blocker and diuretics.    2.  Shortness of breath. Multifactorial: dCHF, COPD, obesity related hypoventilation and deconditioning.   3. Essential hypertension. Controlled. Goal under age 25, < 140/90   4. CAD s/p DES 03/2016. Stable. On DAPTand beta blocker therapy. Intolerant of statins (myalgias)..      Discussed with Dr Jacqualyn Posey    Plan:  1. Increase spironolactone to 1 and 1/2 tablets daily (37.5 mg).  2. Other medications unchanged.  3. Check labs in one week (CMP).  4. Keep follow up as scheduled with Dr Jacqualyn Posey for 3/23.     Thank you for allowing me to participate in the care of your patient.    Jacqualine Code, CNP        QUALITY MEASURES  1. Tobacco Cessation Counseling: N/A  2. BP retake if >140/90: N/A  3. Communication to PCP: Office note  forwarded/faxed to PCP  4. CAD antiplatelet therapy: no (Intolerant)  5. CAD lipid lowering therapy: yes  6. HF A. Fib on anticoagulation: N/A
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
## 2017-02-04 NOTE — Patient Instructions (Signed)
1. Increase spironolactone to 1 and 1/2 tablets daily.    2. Other medications unchanged.    3. Check labs in one week (CMP).    4. Keep follow up as scheduled with Dr Jacqualyn Posey for 3/23.

## 2017-02-04 NOTE — Progress Notes (Signed)
Thought interesting.  Just an FYI.

## 2017-02-04 NOTE — Telephone Encounter (Signed)
Think you saw her for this.  Thanks!
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
## 2017-02-05 NOTE — Telephone Encounter (Signed)
Patient advised.  She said the headaches are better.  Some days are worse than others, but they are improving.  I asked her to check back with [place] in a week to let us know how she is feeling.
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
## 2017-02-05 NOTE — Telephone Encounter (Signed)
Sounds great. Thanks!

## 2017-02-08 ENCOUNTER — Ambulatory Visit: Admit: 2017-02-08 | Payer: PRIVATE HEALTH INSURANCE | Attending: Family | Primary: Sports Medicine

## 2017-02-08 DIAGNOSIS — G4733 Obstructive sleep apnea (adult) (pediatric): Secondary | ICD-10-CM

## 2017-02-08 NOTE — Progress Notes (Signed)
Magdalene River MD, FAASM, FCCP  Judithann Graves, MSN, RN, CNP [place]  [address]  [address], [place]  [address]  [place] [phone]   [facility] [place] [address]  F- [phone]       [facility]    Subjective:     Patient ID: Rachael Mays is a 61 y.o. female.    Chief Complaint   Patient presents with   ??? Sleep Apnea     BFU       HPI:      Machine Present today:  Yes    Machine Modem/Download Info:  Compliance (hours/night): 5.5 hrs/night  Download AHI (/hour): 1.7 /HR     Average IPAP Pressure: 13.5 cmH2O  Average EPAP Pressure: 10.4 cmH2O         AUTO BIPAP - Settings ([place])  IPAP Max: 25 cmH2O  EPAP Min: 10 cmH2O  Pressure Support Min: 2  Pressure Support Max: 4             Comfort Settings  Humidity Level (0-8): 3  Heated Tubing (Yes/No): No  Flex/EPR (0-3): 3 PAP Mask  Mask Type: Nasal mask  Mask Model: Wisp  Mask Size: s     Epworth - Total score: 15    Follow-up :     Last Visit : February 2017      Patient reports the listed Co-morbidities are well controlled and stable at this time.     Subjective Health Changes: None       Follow-up :      Patient is compliant with the machine  [x]  Yes  []  No   Feeling rested when using the machine   [x]  Yes  []  No     Pressure is comfortable with inspiration and expiration  []  Yes  [x]  No having to ramp every night      Noticed changes in pressure   []  Yes  [x]  No  []  NA   Mask is fitting well  [x]  Yes  []  No   Noting Mask Air Leak  []  Yes  [x]  No   Having painful Aerophagia  []  Yes  [x]  No   Nocturia   0-1  per night.   Having  HA upon waking  []  Yes  [x]  No   Dry mouth upon waking   [x]  Yes  []  No   Congestion upon waking   []  Yes  [x]  No    Nose Bleeds  []  Yes  [x]  No   Using Sleep Aides  trazodone- 100mg     Understands how to change humidification and/or tubing temperature for comfort while at home  [x]  Yes  []  No     Difficulties falling asleep   []  Yes  [x]  No   Difficulties staying asleep  []  Yes  [x]  No   Approximate time to bed  2-3am   Approximate wake time  10am   Taking Naps  no   If taking naps usual length    [x]  NA   If taking naps using the machine  []  Yes  []  No  [x]  NA   Drowsy when driving  []  Yes  [x]  No     Does patient carry a DOT/CDL  []  Yes  [x]  No     Does patient carry FAA/P[employer] License   []  Yes  [x]  No  Any concerns noted with the machine at this time  []  Yes  [x]  No         Review of Systems   Constitutional: Positive for fatigue. Negative for appetite change, chills and fever.   HENT: Negative for congestion, nosebleeds, rhinorrhea and sinus pressure.    Respiratory: Negative for apnea, cough and shortness of breath.    Cardiovascular: Negative for chest pain and palpitations.   Gastrointestinal: Negative for abdominal distention and abdominal pain.   Neurological: Negative for dizziness and headaches.       Social History     Social History   ??? Marital status: Married     Spouse name: [name]   ??? Number of children: 2   ??? Years of education: 12     Occupational History   ??? Not on file.     Social History Main Topics   ??? Smoking status: Former Smoker     Packs/day: 0.50     Years: 40.00     Types: Cigarettes     Quit date: 08/07/2012   ??? Smokeless tobacco: Former Systems developer     Quit date: 06/16/2013      Comment: started to smoke at age 4 / only smoked 0.5 p.p.d    ??? Alcohol use No   ??? Drug use: No   ??? Sexual activity: No     Other Topics Concern   ??? Not on file     Social History Narrative    ** Merged History Encounter **            Prior to Admission medications    Medication Sig Start Date End Date Taking? Authorizing Provider   spironolactone (ALDACTONE) 25 MG tablet Take 1.5 tablets by mouth daily 02/04/17  Yes Maebelle Munroe Helferich, CNP   fluocinonide (LIDEX) 0.05 % external solution APPLY SPARINGLY TO THE BACK OF THE SCALP DAILY IF NEEDED FOR ITCHING 02/02/17  Yes Francisca December, MD   venlafaxine (EFFEXOR XR) 150 MG extended release  capsule Take 1 capsule by mouth daily 01/28/17  Yes Harvel Quale, MD   traZODone (DESYREL) 100 MG tablet TAKE ONE TABLET BY MOUTH NIGHTLY 01/28/17  Yes Harvel Quale, MD   ALPRAZolam Duanne Moron) 0.25 MG tablet Take 1 tablet by mouth 2 times daily as needed for Anxiety for up to 30 days. 01/27/17 02/26/17 Yes Caren Macadam, MD   Elastic Bandages & Supports (WRIST SPLINT/COCK-UP/LEFT M) MISC 1 Device by Does not apply route daily as needed (wrist pain) 01/27/17  Yes Caren Macadam, MD   imipramine (TOFRANIL) 25 MG tablet Take 1 tablet daily 01/15/17  Yes Connye Burkitt, MD   torsemide (DEMADEX) 20 MG tablet Take 2 tablets by mouth daily 01/11/17  Yes Connye Burkitt, MD   montelukast (SINGULAIR) 10 MG tablet TAKE ONE TABLET BY MOUTH ONE TIME A DAY 01/05/17  Yes Harvel Quale, MD   triamcinolone (KENALOG) 0.1 % cream Apply topically 2 times daily.Apply with Q tip in each ear BID for 1 week. 01/04/17  Yes Alvina Chou, MD   mupirocin (BACTROBAN) 2 % ointment Apply topically  Daily.Apply with Q tip in each naris at night 01/04/17  Yes Alvina Chou, MD   metoprolol (LOPRESSOR) 100 MG tablet Take 0.5 tablets by mouth 2 times daily 07/17/16  Yes Rana Anders Grant, MD   lisinopril (PRINIVIL;ZESTRIL) 40 MG tablet Take 1 tablet by mouth daily Indications: Patient thought this was dc'd in December  by PCP  Patient taking differently: Take 40 mg by mouth daily  04/09/16  Yes Harvel Quale, MD   omeprazole [facility]) 40 MG delayed release capsule Take 1 capsule by mouth daily 04/09/16  Yes Harvel Quale, MD   oxyCODONE-acetaminophen (PERCOCET) 5-325 MG per tablet Take 1 tablet by mouth every 6 hours as needed for Pain . 04/07/16  Yes Bhumit Patel, MD   nitroGLYCERIN (NITROSTAT) 0.4 MG SL tablet up to max of 3 total doses. If no relief after 1 dose, call 911. 04/06/16  Yes Bhumit Patel, MD   aspirin 81 MG EC tablet Take 1 tablet by mouth daily 04/02/16  Yes Leodis Binet, MD   prasugrel  (EFFIENT) 10 MG TABS Take 1 tablet by mouth daily 04/02/16  Yes Leodis Binet, MD   albuterol sulfate HFA (PROVENTIL HFA) 108 (90 BASE) MCG/ACT inhaler Inhale 2 puffs into the lungs every 6 hours as needed for Wheezing 12/27/15  Yes Harvel Quale, MD   Multiple Vitamins-Minerals (MULTIVITAMIN PO) Take 1 tablet by mouth daily    Yes Historical Provider, MD   CALCIUM-VITAMIN D PO Take 1 tablet by mouth daily    Yes Historical Provider, MD   cetirizine (ZYRTEC ALLERGY) 10 MG tablet Take 10 mg by mouth daily.   Yes Historical Provider, MD       Allergies as of 02/08/2017 - Review Complete 02/08/2017   Allergen Reaction Noted   ??? Morphine Itching 05/04/2014   ??? Seasonal  08/28/2016   ??? Talwin [pentazocine] Other (See Comments) 03/29/2013       Patient Active Problem List   Diagnosis   ??? Back pain   ??? Deep vein thrombosis ([place])   ??? Depression   ??? Pulmonary embolism ([place])   ??? Nausea & vomiting   ??? Abdominal  pain, other specified site   ??? Morbid obesity due to excess calories ([place])   ??? Status post gastric banding   ??? Vertigo   ??? Dizziness   ??? Tinnitus, subjective   ??? Other complications of gastric band procedure   ??? Essential hypertension   ??? Hyperglycemia   ??? Atypical chest pain   ??? 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 Adrenal mass, left ([place])   ??? Adrenal nodule ([place])   ??? Obstructive sleep apnea   ??? Dyspnea and respiratory abnormalities   ??? Abnormal stress test   ??? Shortness of breath   ??? Chronic diastolic congestive heart failure ([place])   ??? Coronary artery disease due to lipid rich plaque   ??? Pulmonary hypertension   ??? 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 Chest pain   ??? Acute actinic otitis externa of both ears   ??? Bilateral acute serous otitis media   ??? Anxiety and depression   ??? Bradycardia   ??? Dyslipidemia   ??? Chronic eczematous otitis externa of both ears   ??? Mucositis   ??? Sprain of radiocarpal joint of right wrist       Past Medical History:   Diagnosis Date    ??? Anxiety    ??? Asthma    ??? Blood circulation, collateral    ??? CAD (coronary artery disease)    ??? CHF (congestive heart failure) ([place])    ??? Chronic back pain    ??? Deep vein thrombosis    ??? Depression    ??? GERD (gastroesophageal reflux disease)     NO  LONGER SINCE LAP   ??? GERD (gastroesophageal reflux disease) 01/23/2009   ??? Hx of blood clots    ??? Hyperlipidemia     hx; resolved with lap band   ??? Hypertension    ??? Obesity     hx of; had lap band   ??? Obstructive sleep apnea (adult) (pediatric) 09/18/2015   ??? Pulmonary embolism [facility])        Past Surgical History:   Procedure Laterality Date   ??? BACK SURGERY      neck  plates   ??? BREAST SURGERY      left lumpectomy   ??? CESAREAN SECTION     ??? CHOLECYSTECTOMY     ??? COLONOSCOPY     ??? COLONOSCOPY  04/08/10   ??? CORONARY ANGIOPLASTY     ??? DIAGNOSTIC CARDIAC CATH LAB PROCEDURE     ??? DILATATION, ESOPHAGUS     ??? ENDOSCOPY, COLON, DIAGNOSTIC     ??? HYSTERECTOMY     ??? LAP BAND  05/01/08    Dr. Nicole Cella   ??? OTHER SURGICAL HISTORY      [place] Filter: curently in place   ??? OTHER SURGICAL HISTORY  07/05/13    lap band removal   ??? PARTIAL HYSTERECTOMY     ??? SHOULDER SURGERY     ??? UPPER GASTROINTESTINAL ENDOSCOPY  05/19/13   ??? UPPER GASTROINTESTINAL ENDOSCOPY  07/21/13    ESOPHAGEAL STENT PLACEMENT   ??? UPPER GASTROINTESTINAL ENDOSCOPY N/A 07/31/2014    Esophagogastroduodenoscopy with esophageal balloon dilation   ??? VASCULAR SURGERY  04/2015    Lucendia Herrlich, celiac artery angiogram, normal abd arteries       Family History   Problem Relation Age of Onset   ??? Arthritis Mother    ??? Cancer Mother    ??? Depression Mother    ??? High Blood Pressure Mother    ??? Heart Disease Mother 42     MI    ??? Ovarian Cancer Mother 58   ??? Cancer Father 64     colon   ??? Heart Disease Maternal Grandmother    ??? Early Death Paternal Grandmother    ??? Heart Disease Paternal Grandfather    ??? Diabetes Other    ??? High Blood Pressure Other    ??? Obesity Other    ??? Other Sister      OSA   ??? Other Brother      OSA   ??? Other Brother       OSA   ??? Other Daughter      OSA       Vitals:  Weight BMI   Wt Readings from Last 3 Encounters:   02/08/17 (!) 301 lb 6.4 oz (136.7 kg)   02/04/17 300 lb (136.1 kg)   02/04/17 (!) 309 lb 6.4 oz (140.3 kg)    Body mass index is 48.65 kg/m??.     BP HR SaO2   BP Readings from Last 3 Encounters:   02/08/17 116/62   02/04/17 113/60   02/04/17 136/66    Pulse Readings from Last 3 Encounters:   02/08/17 62   02/04/17 64   02/04/17 60    SpO2 Readings from Last 3 Encounters:   02/08/17 96%   02/04/17 97%   01/27/17 98%        Assessment:     1. Obstructive sleep apnea syndrome Stable    2. Essential hypertension Stable    3. Chronic obstructive pulmonary disease, unspecified COPD type ([place])  Stable    4. Depression, unspecified depression type Stable    5. Morbid obesity, unspecified obesity type ([place]) Stable        The chronic medical conditions listed are directly related to the primary diagnosis listed above.  The management of the primary diagnosis affects the secondary diagnosis and vice versa.  Plan:   - Educated patient and reviewed compliance download with pt.    -Supplies and parts as needed for her machine, these are medically necessary.    - Patient using [facility] [employer] for supplies  -Continue medications per her PCP and other physicians.   -Limit caffeine use after 3pm.    -Encouraged her to work on weight loss through diet and exercise.  - EPAP 8, Psmin 1, Psmax 3  - Will ask [employer] to send heated tubing to cut down on the rain out   -F/U: 12 month.    No orders of the defined types were placed in this encounter.      No orders of the defined types were placed in this encounter.      No orders of the defined types were placed in this encounter.      Judithann Graves, MSN, RN, CNP

## 2017-02-09 ENCOUNTER — Encounter: Attending: Family Medicine | Primary: Sports Medicine

## 2017-02-11 ENCOUNTER — Ambulatory Visit
Admit: 2017-02-11 | Discharge: 2017-02-11 | Payer: PRIVATE HEALTH INSURANCE | Attending: Family Medicine | Primary: Sports Medicine

## 2017-02-11 DIAGNOSIS — R519 Headache, unspecified: Secondary | ICD-10-CM

## 2017-02-11 MED ORDER — TRAZODONE HCL 100 MG PO TABS
100 | ORAL_TABLET | ORAL | 3 refills | Status: DC
Start: 2017-02-11 — End: 2017-02-16
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
## 2017-02-11 NOTE — Telephone Encounter (Signed)
Pt c/b [phone]  Pt states:   - medication Clotrimazole Creamis not working for her   - seen Dr. Thora Lance in Jan and it was a mail order medication   - rash has not gone away   - if she does not put the exact same amount on it gets worse   - he gets blood on her underpants in the crotch area and left hip of underpants   - under the breast is about the same    - in the groaning is worse  Pharm [facility]  [phone]  Please call to discuss thanks

## 2017-02-11 NOTE — Telephone Encounter (Signed)
Pt was seen in January for candidiasis and advised to call with an update in 1 month. Pt is not any better in the groin, it is worse. Per office note you were to consider Diflucan. Please advise
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
## 2017-02-12 MED ORDER — FLUCONAZOLE 150 MG PO TABS
150 MG | ORAL_TABLET | ORAL | 0 refills | Status: DC
Start: 2017-02-12 — End: 2017-03-26

## 2017-02-12 NOTE — Telephone Encounter (Signed)
Will send Rx for Diflucan - take 1 weekly for 2 weeks.

## 2017-02-12 NOTE — Telephone Encounter (Signed)
Spoke to pt and advised rx has been sent in to pharmacy. Pt notified

## 2017-02-15 ENCOUNTER — Inpatient Hospital Stay: Attending: Acute Care | Primary: Sports Medicine

## 2017-02-15 DIAGNOSIS — I5032 Chronic diastolic (congestive) heart failure: Secondary | ICD-10-CM
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
## 2017-02-16 ENCOUNTER — Telehealth

## 2017-02-16 ENCOUNTER — Encounter

## 2017-02-16 LAB — COMPREHENSIVE METABOLIC PANEL
ALT: 44 U/L — ABNORMAL HIGH (ref 10–40)
AST: 42 U/L — ABNORMAL HIGH (ref 15–37)
Albumin/Globulin Ratio: 1.3 (ref 1.1–2.2)
Albumin: 4.3 g/dL (ref 3.4–5.0)
Alkaline Phosphatase: 84 U/L (ref 40–129)
Anion Gap: 17 — ABNORMAL HIGH (ref 3–16)
BUN: 18 mg/dL (ref 7–20)
CO2: 25 mmol/L (ref 21–32)
Calcium: 9.3 mg/dL (ref 8.3–10.6)
Chloride: 102 mmol/L (ref 99–110)
Creatinine: 1 mg/dL (ref 0.6–1.1)
GFR African American: >60 (ref >60)
GFR Non-African American: 57 — AB (ref >60)
Globulin: 3.2 g/dL
Glucose: 133 mg/dL — ABNORMAL HIGH (ref 70–99)
Potassium: 4.2 mmol/L (ref 3.5–5.1)
Sodium: 144 mmol/L (ref 136–145)
Total Bilirubin: 0.3 mg/dL (ref 0.0–1.0)
Total Protein: 7.5 g/dL (ref 6.4–8.2)

## 2017-02-16 MED ORDER — ASPIRIN 81 MG PO TBEC
81 | ORAL_TABLET | Freq: Every day | ORAL | 11 refills | Status: AC
Start: 2017-02-16 — End: ?

## 2017-02-16 MED ORDER — LISINOPRIL 40 MG PO TABS
40 | ORAL_TABLET | Freq: Every day | ORAL | 2 refills | Status: DC
Start: 2017-02-16 — End: 2017-04-12

## 2017-02-16 MED ORDER — VENLAFAXINE HCL ER 150 MG PO CP24
150 | ORAL_CAPSULE | Freq: Every day | ORAL | 3 refills | Status: DC
Start: 2017-02-16 — End: 2018-03-17

## 2017-02-16 MED ORDER — PRASUGREL HCL 10 MG PO TABS
10 | ORAL_TABLET | Freq: Every day | ORAL | 3 refills | Status: DC
Start: 2017-02-16 — End: 2017-03-29

## 2017-02-16 MED ORDER — FLUOCINONIDE 0.05 % EX SOLN
0.05 | CUTANEOUS | 1 refills | Status: DC
Start: 2017-02-16 — End: 2017-12-16

## 2017-02-16 MED ORDER — OMEPRAZOLE 40 MG PO CPDR
40 | ORAL_CAPSULE | Freq: Every day | ORAL | 3 refills | Status: DC
Start: 2017-02-16 — End: 2017-05-04

## 2017-02-16 MED ORDER — SPIRONOLACTONE 25 MG PO TABS
25 | ORAL_TABLET | Freq: Every day | ORAL | 3 refills | Status: DC
Start: 2017-02-16 — End: 2017-12-16

## 2017-02-16 MED ORDER — MONTELUKAST SODIUM 10 MG PO TABS
10 | ORAL_TABLET | ORAL | 1 refills | Status: DC
Start: 2017-02-16 — End: 2017-08-13

## 2017-02-16 MED ORDER — NITROGLYCERIN 0.4 MG SL SUBL
0.4 | ORAL_TABLET | SUBLINGUAL | 3 refills | Status: DC
Start: 2017-02-16 — End: 2017-12-31

## 2017-02-16 MED ORDER — TRAZODONE HCL 100 MG PO TABS
100 | ORAL_TABLET | ORAL | 3 refills | Status: DC
Start: 2017-02-16 — End: 2018-03-17

## 2017-02-16 MED ORDER — METOPROLOL TARTRATE 100 MG PO TABS
100 | ORAL_TABLET | Freq: Two times a day (BID) | ORAL | 1 refills | Status: DC
Start: 2017-02-16 — End: 2017-04-12

## 2017-02-16 NOTE — Telephone Encounter (Signed)
Pt stopped in at front desk wanting to know if:  1 we have received a fax from [facility]; and  2 how long will it take to fill out the fax from [facility] and return it to them?    Pt would like a call today

## 2017-02-16 NOTE — Telephone Encounter (Signed)
-----   Message from Ronal Fear, CNP sent at 02/16/2017  9:37 AM EDT -----  Notify patient labs show stable potassium and renal function. Continue current medications including increased spironolactone dose (37.5 mg) as we discussed in office. Recheck BMP in one month. Thanks.

## 2017-02-16 NOTE — Telephone Encounter (Signed)
LVM with results for patient.  Instructed to call if any questions.

## 2017-02-16 NOTE — Telephone Encounter (Signed)
Please advise

## 2017-02-16 NOTE — Telephone Encounter (Signed)
Received records request from [facility]. Faxed to [place]. Request, fax confirmation and ROI scanned into chart

## 2017-02-16 NOTE — Telephone Encounter (Signed)
Patient advised we do not have paper work

## 2017-02-17 NOTE — Telephone Encounter (Signed)
Pt calling states she has had a 4lb weight gain overnight, trouble breathing,hand and feet swelling and wants to know what to do. Please call to advise,Thank You!

## 2017-02-17 NOTE — Telephone Encounter (Signed)
Informed pt to take extra dose and she stated she will. She said she didn't want to come in for an appointment because she goes into work tomorrow at 45 and it costs her 55 dollars a visit and she feels as if it wouldn't help

## 2017-02-17 NOTE — Telephone Encounter (Signed)
Remind patient of need to recheck labs (BMP) in one week. Order in [place]. If stable renal function and potassium, will likely increase spironolactone further. Remind her of up coming appt with Dr Jacqualyn Posey 3/23. Thanks.

## 2017-02-17 NOTE — Telephone Encounter (Signed)
SOB--can't breath  Four pound weight gain overnight.  Calves, feet and stomach swollen  Chest pain that aches in arms  Very tearful and upset.  Since about 11am today.  Took nitro today and eased the pain a bit.  Takes torsemide 40mg  daily.  Weight today at 301.1  Yesterday 296.4

## 2017-02-17 NOTE — Telephone Encounter (Signed)
Did she increase her aldactone to 37.5 mg as [place] had instructed if yes then she can take an extra torsemide 20 mg now and see if this improves her weight  64 ounces fluid a day and 4000 mg sodium diet  [facility] has an opening at 9 am tomorrow if no improvement  thanks

## 2017-02-18 NOTE — Telephone Encounter (Signed)
LVM on patient's mobile number with reminders on labs and upcoming appointment and [facility]'s comments.  Instructed to call office if any questions

## 2017-02-23 NOTE — Telephone Encounter (Signed)
Per Ivin Booty with [employer] would like a call regarding pt.    States pt has had 8 ER visits in the past year, Would like to know if you are actively following pt?    Please call when able.  She states she has a Health visitor.

## 2017-02-23 NOTE — Care Coordination-Inpatient (Signed)
RNCC attempted call to patient- no answer. Message left and call back requested.

## 2017-02-24 NOTE — Care Coordination-Inpatient (Signed)
RNCC received message that Gatha Mayer, [facility] Case Manager wanted to discuss patient d/r high utilization. RNCC placed call back to [facility] and left message.

## 2017-02-26 ENCOUNTER — Encounter: Attending: Cardiovascular Disease | Primary: Sports Medicine

## 2017-03-02 NOTE — Care Coordination-Inpatient (Signed)
Initially, she was taking torsemide 40 mg daily. I am okay with decrease to 20 mg daily as long as breathing and weight is stable. She should consider compression stockings which would help with the swelling she is experiencing in legs. She has follow up with Dr Susy Manor 4/2 and we can see how she is doing at that time. Thanks for the update.
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
## 2017-03-08 ENCOUNTER — Encounter: Attending: Cardiovascular Disease | Primary: Sports Medicine

## 2017-03-09 NOTE — Telephone Encounter (Signed)
Rachael Mays was prescribed Diflucan on 3/8 and it did not help her rash, she is still the same. She is wondering if Dr. Thora Lance would recommend another medication?

## 2017-03-09 NOTE — Telephone Encounter (Signed)
I'm happy to see her back in the office.     Can go back to using triamcinolone cream. Does she have any at home?

## 2017-03-09 NOTE — Care Coordination-Inpatient (Signed)
Ambulatory Care Coordination Note  03/09/2017  CM Risk Score: 11  Gagne Mortality Risk Score:      ACC: Roderic Scarce, RN    Summary Note: [place] placed call to patient to notify her of Santiago Glad Helferich's suggestions to wear compression stockings while working and that she was okay with the decrease in torsemide as long as the patient did not have weight gain or swelling issues.     Rachael Mays reports that her weight increased 3 lbs overnight. Patient denies SOB and chest pain. RNCC instructed patient to call cardiology office. Rachael Mays said that she didn't call because every time she calls with a weight gain, they instruct her to take an additional torsemide, which she is already planning to do. RNCC encouraged patient to call cardiology tomorrow if her weight does not start to trend down after the extra dose of torsdemide today.Patient agreeable.     PLAN: RNCC will call in 2 days to follow up on weight.       Care Coordination Interventions    Program Enrollment:  Complex Care  Referral from Primary Care Provider:  No  Suggested Interventions and Community Resources  Zone Management Tools:  In Process  Other Services or Interventions:  declined CHF class at this time.          Goals Addressed             Most Recent       Patient Stated    ??? COMPLETED: Patient Stated (pt-stated)   On track (03/09/2017)             Patient wants to walk three times a week.    Barriers: Leg stiffness  Plan for overcoming my barriers: N/A  Confidence: 10/10  Anticipated Goal Completion Date: 07/27/16         Other    ??? Self Monitoring   On track (03/09/2017)             Daily Weights - I will notify my provider of any increase in weight by 3 or more pounds in 2 days OR 5 or more pounds in a week.    None Recently Recorded    Barriers: none  Plan for overcoming my barriers: N/A  Confidence: 10/10  Anticipated Goal Completion Date: 3 months                Prior to Admission medications    Medication Sig Start Date End Date Taking? Authorizing Provider    aspirin 81 MG EC tablet Take 1 tablet by mouth daily 02/16/17   Harvel Quale, MD   fluocinonide (LIDEX) 0.05 % external solution APPLY SPARINGLY TO THE BACK OF THE SCALP DAILY IF NEEDED FOR ITCHING 02/16/17   Harvel Quale, MD   montelukast (SINGULAIR) 10 MG tablet TAKE ONE TABLET BY MOUTH ONE TIME A DAY 02/16/17   Harvel Quale, MD   spironolactone (ALDACTONE) 25 MG tablet Take 1.5 tablets by mouth daily 02/16/17   Harvel Quale, MD   omeprazole [facility]) 40 MG delayed release capsule Take 1 capsule by mouth daily 02/16/17   Harvel Quale, MD   traZODone (DESYREL) 100 MG tablet TAKE ONE TABLET BY MOUTH NIGHTLY 02/16/17   Harvel Quale, MD   venlafaxine (EFFEXOR XR) 150 MG extended release capsule Take 1 capsule by mouth daily 02/16/17   Harvel Quale, MD   nitroGLYCERIN (NITROSTAT) 0.4 MG SL tablet up to max  of 3 total doses. If no relief after 1 dose, call 911. 02/16/17   Harvel Quale, MD   metoprolol [facility]) 100 MG tablet Take 0.5 tablets by mouth 2 times daily 02/16/17   Harvel Quale, MD   prasugrel (EFFIENT) 10 MG TABS Take 1 tablet by mouth daily 02/16/17   Harvel Quale, MD   lisinopril (PRINIVIL;ZESTRIL) 40 MG tablet Take 1 tablet by mouth daily 02/16/17   Harvel Quale, MD   fluconazole (DIFLUCAN) 150 MG tablet Take 1 by mouth weekly for 2 weeks. 02/12/17   Francisca December, MD   Elastic Bandages & Supports (WRIST SPLINT/COCK-UP/LEFT M) MISC 1 Device by Does not apply route daily as needed (wrist pain) 01/27/17   Caren Macadam, MD   imipramine (TOFRANIL) 25 MG tablet Take 1 tablet daily 01/15/17   Connye Burkitt, MD   torsemide [facility]) 20 MG tablet Take 2 tablets by mouth daily 01/11/17   Connye Burkitt, MD   triamcinolone (KENALOG) 0.1 % cream Apply topically 2 times daily.Apply with Q tip in each ear BID for 1 week. 01/04/17   Alvina Chou, MD   mupirocin (BACTROBAN) 2 % ointment Apply topically   Daily.Apply with Q tip in each naris at night 01/04/17   Alvina Chou, MD   oxyCODONE-acetaminophen (PERCOCET) 5-325 MG per tablet Take 1 tablet by mouth every 6 hours as needed for Pain . 04/07/16   Bhumit Patel, MD   albuterol sulfate HFA (PROVENTIL HFA) 108 (90 BASE) MCG/ACT inhaler Inhale 2 puffs into the lungs every 6 hours as needed for Wheezing 12/27/15   Harvel Quale, MD   Multiple Vitamins-Minerals (MULTIVITAMIN PO) Take 1 tablet by mouth daily     Historical Provider, MD   CALCIUM-VITAMIN D PO Take 1 tablet by mouth daily     Historical Provider, MD   cetirizine (ZYRTEC ALLERGY) 10 MG tablet Take 10 mg by mouth daily.    Historical Provider, MD       Future Appointments  Date Time Provider [place]   04/28/2017 4:00 PM Leodis Binet, MD FF Cardio MMA   02/07/2018 2:00 PM Tiffanie Rita Ohara, CNP FF SLEEP MED MMA     ,   Congestive Heart Failure Assessment    Are you currently restricting fluids?:  2000cc  Do you understand a low sodium diet?:  Yes  Do you understand how to read food labels?:  Yes  Do you salt your food before tasting it?:  No     Increase in weights (more than 3lbs overnight or 5lbs in one week)      Symptoms:   CHF associated leg swelling: Pos      Symptom course:  worsening  Patient-reported weight (lb):  297  Weight trend:  increasing steadily      and   General Assessment    Do you have any symptoms that are causing concern?:  Yes  Progression since Onset:  Gradually Worsening  Reported Symptoms:  Weight Gain
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
## 2017-03-10 LAB — BASIC METABOLIC PANEL
Anion Gap: 13 (ref 3–16)
BUN: 17 mg/dL (ref 7–20)
CO2: 29 mmol/L (ref 21–32)
Calcium: 9.7 mg/dL (ref 8.3–10.6)
Chloride: 102 mmol/L (ref 99–110)
Creatinine: 1.1 mg/dL (ref 0.6–1.1)
GFR African American: >60 (ref >60)
GFR Non-African American: 51 — AB (ref >60)
Glucose: 98 mg/dL (ref 70–99)
Potassium: 4.3 mmol/L (ref 3.5–5.1)
Sodium: 144 mmol/L (ref 136–145)

## 2017-03-10 NOTE — Telephone Encounter (Signed)
Left message for pt to call to schedule apt. Please offer an apt on 03/22/17 any time that is available. Pt was advised she may go back to using the TAC cream.

## 2017-03-10 NOTE — Telephone Encounter (Signed)
I agree with the Care Coordinator's Plan of Care

## 2017-03-11 NOTE — Telephone Encounter (Signed)
-----   Message from Dorian Furnace, CNP sent at 03/10/2017  9:12 PM EDT -----  Notify patient labs show stable renal function. Continue current medications. Thanks.

## 2017-03-11 NOTE — Telephone Encounter (Signed)
LVM with results/instructions for the patient.  Instructed to call us if any questions.

## 2017-03-15 NOTE — Telephone Encounter (Signed)
Left message for pt to call, if apt is needed please offer 03/22/17 in an open slot

## 2017-03-22 NOTE — Telephone Encounter (Signed)
Pt has never returned phone calls, closing encounter

## 2017-03-23 NOTE — Telephone Encounter (Signed)
Noted  

## 2017-03-23 NOTE — Telephone Encounter (Signed)
PA REQUEST FOR SPIRONOLACTONE HAS BEEN COMPLETED IN CMM   SENT TO THE PLAN

## 2017-03-26 ENCOUNTER — Ambulatory Visit
Admit: 2017-03-26 | Discharge: 2017-03-26 | Payer: PRIVATE HEALTH INSURANCE | Attending: Family Medicine | Primary: Sports Medicine

## 2017-03-26 DIAGNOSIS — G609 Hereditary and idiopathic neuropathy, unspecified: Secondary | ICD-10-CM
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
## 2017-03-26 NOTE — Telephone Encounter (Signed)
PA IS NOT NEEDED FOR SPIRONOLACTONE .  THIS IS A COVERED MEDICATION .

## 2017-03-26 NOTE — Patient Instructions (Addendum)
Get blood tests done and use thumb spica splint, tylenol at night for pain

## 2017-03-29 ENCOUNTER — Inpatient Hospital Stay: Attending: Family Medicine | Primary: Sports Medicine

## 2017-03-29 DIAGNOSIS — G609 Hereditary and idiopathic neuropathy, unspecified: Secondary | ICD-10-CM

## 2017-03-29 MED ORDER — PRASUGREL HCL 10 MG PO TABS
10 MG | ORAL_TABLET | Freq: Every day | ORAL | 3 refills | Status: DC
Start: 2017-03-29 — End: 2018-03-17

## 2017-03-29 NOTE — Telephone Encounter (Signed)
02/04/17 ov with NP Santiago Glad.

## 2017-03-30 LAB — KAPPA/LAMBDA QUANTITATIVE FREE LIGHT CHAINS, SERUM
K/L RATIO: 1.14 (ref 0.26–1.65)
KAPPA, FREE LIGHT CHAINS, SERUM: 21.62 mg/L — ABNORMAL HIGH (ref 3.30–19.40)
LAMBDA, FREE LIGHT CHAINS, SERUM: 18.94 mg/L (ref 5.71–26.30)

## 2017-03-30 LAB — BASIC METABOLIC PANEL
Anion Gap: 13 (ref 3–16)
BUN: 24 mg/dL — ABNORMAL HIGH (ref 7–20)
CO2: 30 mmol/L (ref 21–32)
Calcium: 9.5 mg/dL (ref 8.3–10.6)
Chloride: 103 mmol/L (ref 99–110)
Creatinine: 1 mg/dL (ref 0.6–1.1)
GFR African American: >60 (ref >60)
GFR Non-African American: 57 — AB (ref >60)
Glucose: 108 mg/dL — ABNORMAL HIGH (ref 70–99)
Potassium: 4.4 mmol/L (ref 3.5–5.1)
Sodium: 146 mmol/L — ABNORMAL HIGH (ref 136–145)

## 2017-03-30 LAB — VITAMIN B12 & FOLATE
Folate: >20 ng/mL (ref 4.78–24.20)
Vitamin B-12: 529 pg/mL (ref 211–911)

## 2017-03-30 LAB — TSH WITH REFLEX TO FT4: TSH Reflex FT4: 1.24 u[IU]/mL (ref 0.27–4.20)

## 2017-03-30 LAB — HEMOGLOBIN A1C
Hemoglobin A1C: 5.6 %
eAG: 114 mg/dL

## 2017-03-31 LAB — ELECTROPHORESIS PROTEIN, SERUM
Albumin: 3.6 g/dL (ref 3.1–4.9)
Alpha-1-Globulin: 0.2 g/dL (ref 0.2–0.4)
Alpha-2-Globulin: 0.7 g/dL (ref 0.4–1.1)
Beta Globulin: 1.2 g/dL (ref 0.9–1.6)
Gamma Globulin: 1.2 g/dL (ref 0.6–1.8)
Total Protein: 7 g/dL (ref 6.4–8.2)

## 2017-04-01 LAB — LEAD, BLOOD: Lead: <2 ug/dL (ref 0.0–4.9)

## 2017-04-02 ENCOUNTER — Telehealth

## 2017-04-02 NOTE — Telephone Encounter (Signed)
Left a message informing pt as per understanding

## 2017-04-02 NOTE — Telephone Encounter (Signed)
I spoke with Rachael Mays. She has been taking 20 of furosemide  BID till she got a new job, Then took 1 daily. She noted an increase in swelling all over her body so she alternated 20 daily and 40 daily. Her weight was 293.4 on Tuesday, 297.6 today increased swelling sob. Denies CP, radiating pain, dizziness , syncope.

## 2017-04-02 NOTE — Telephone Encounter (Signed)
Calling states pt 4.2 lbs since Tuesday,has been sob,and has increased swelling. Pt wants to know what to do. Please call to advise,Thank You!

## 2017-04-02 NOTE — Care Coordination-Inpatient (Signed)
Ambulatory Care Coordination Note  04/02/2017  CM Risk Score: 11  Gagne Mortality Risk Score:      ACC: Roderic Scarce, RN    Summary Note: [place] placed follow up to patient. Patient reports she is "good", but reports weight gain and increased SOB. Her weight on 4/24 was 293.4 lbs. Her weight today is 207.6 lbs. She reports she has swelling in her BLE, abd, hands, and face. Patient reports she has been taking 20mg  torsemide daily, and an additional 20 mg of torsemide every other day.     RNCC reviewed the importance of notifying cardiology of increased swelling, SOB and weight gain. Patient voiced frustration by saying "all they do is tell me to take an extra torsemide, so I might as well do it myself."     [facility] placed call to cardiology and received call back from [place] with [place] office. Deneise Lever said she would discuss with Santiago Glad and call back.         Care Coordination Interventions    Program Enrollment:  Complex Care  Referral from Primary Care Provider:  No  Suggested Interventions and Community Resources  Disease Specific Clinic:  Completed (Comment: CHF at [facility])  Registered Dietician:  Declined  Zone Management Tools:  Completed  Other Services or Interventions:  declined CHF class at this time.          Goals Addressed             Most Recent    . Self Monitoring   No change (04/02/2017)             Daily Weights - I will notify my provider of any increase in weight by 3 or more pounds in 2 days OR 5 or more pounds in a week.    None Recently Recorded    Barriers: none  Plan for overcoming my barriers: N/A  Confidence: 10/10  Anticipated Goal Completion Date: 3 months                Prior to Admission medications    Medication Sig Start Date End Date Taking? Authorizing Provider   prasugrel (EFFIENT) 10 MG TABS TAKE 1 TABLET BY MOUTH DAILY 03/29/17   Leodis Binet, MD   aspirin 81 MG EC tablet Take 1 tablet by mouth daily 02/16/17   Harvel Quale, MD   fluocinonide (LIDEX) 0.05 % external  solution APPLY SPARINGLY TO THE BACK OF THE SCALP DAILY IF NEEDED FOR ITCHING 02/16/17   Harvel Quale, MD   montelukast (SINGULAIR) 10 MG tablet TAKE ONE TABLET BY MOUTH ONE TIME A DAY 02/16/17   Harvel Quale, MD   spironolactone (ALDACTONE) 25 MG tablet Take 1.5 tablets by mouth daily 02/16/17   Harvel Quale, MD   omeprazole [facility]) 40 MG delayed release capsule Take 1 capsule by mouth daily 02/16/17   Harvel Quale, MD   traZODone (DESYREL) 100 MG tablet TAKE ONE TABLET BY MOUTH NIGHTLY 02/16/17   Harvel Quale, MD   venlafaxine (EFFEXOR XR) 150 MG extended release capsule Take 1 capsule by mouth daily 02/16/17   Harvel Quale, MD   nitroGLYCERIN (NITROSTAT) 0.4 MG SL tablet up to max of 3 total doses. If no relief after 1 dose, call 911. 02/16/17   Harvel Quale, MD   metoprolol [facility]) 100 MG tablet Take 0.5 tablets by mouth 2 times daily 02/16/17   Lucky Rathke  Rayford Halsted, MD   lisinopril (PRINIVIL;ZESTRIL) 40 MG tablet Take 1 tablet by mouth daily 02/16/17   Harvel Quale, MD   Elastic Bandages & Supports (WRIST SPLINT/COCK-UP/LEFT M) MISC 1 Device by Does not apply route daily as needed (wrist pain) 01/27/17   Caren Macadam, MD   torsemide (DEMADEX) 20 MG tablet Take 2 tablets by mouth daily 01/11/17   Connye Burkitt, MD   triamcinolone (KENALOG) 0.1 % cream Apply topically 2 times daily.Apply with Q tip in each ear BID for 1 week. 01/04/17   Alvina Chou, MD   mupirocin (BACTROBAN) 2 % ointment Apply topically  Daily.Apply with Q tip in each naris at night 01/04/17   Alvina Chou, MD   oxyCODONE-acetaminophen (PERCOCET) 5-325 MG per tablet Take 1 tablet by mouth every 6 hours as needed for Pain . 04/07/16   Bhumit Patel, MD   albuterol sulfate HFA (PROVENTIL HFA) 108 (90 BASE) MCG/ACT inhaler Inhale 2 puffs into the lungs every 6 hours as needed for Wheezing 12/27/15   Harvel Quale, MD   Multiple  Vitamins-Minerals (MULTIVITAMIN PO) Take 1 tablet by mouth daily     Historical Provider, MD   CALCIUM-VITAMIN D PO Take 1 tablet by mouth daily     Historical Provider, MD   cetirizine (ZYRTEC ALLERGY) 10 MG tablet Take 10 mg by mouth daily.    Historical Provider, MD       Future Appointments  Date Time Provider [place]   04/28/2017 4:00 PM Leodis Binet, MD FF Cardio MMA   02/07/2018 2:00 PM Tiffanie Rita Ohara, APRN - CNP FF SLEEP MED MMA     ,   Congestive Heart Failure Assessment    Are you currently restricting fluids?:  2000cc  Do you understand a low sodium diet?:  Yes  Do you understand how to read food labels?:  Yes  How many restaurant meals do you eat per week?:  3-4  Do you salt your food before tasting it?:  No     Increase in weights (more than 3lbs overnight or 5lbs in one week), Swelling (worse than baseline) in hands, feet/legs or around abdomen, Shortness of breath (worse than baseline)      Symptoms:   CHF associated dyspnea on exertion: Pos, CHF associated leg swelling: Pos, CHF associated shortness of breath: Pos      Symptom course:  worsening  Patient-reported weight (lb):  297.6 (Comment: UP FROM 293.4 ON 4/24)  Weight trend:  increasing steadily  Salt intake watch compared to last visit:  worse      and   General Assessment    Do you have any symptoms that are causing concern?:  Yes  Progression since Onset:  Gradually Worsening  Reported Symptoms:  Weight Gain, Shortness of Breath

## 2017-04-02 NOTE — Telephone Encounter (Signed)
Clarify with patient that she is taking torsemide and not furosemide. Instruct her to take torsemide 40 mg daily dosing, either 20 mg BID or 40 mg daily over the weekend. She is to call Monday with update and I ned her to have labs checked either tomorrow (Saturday) or Monday. Orders placed. Thanks. Marland Kitchen

## 2017-04-05 NOTE — Care Coordination-Inpatient (Signed)
RNCC attempted follow up call to patient to review weights- no answer. RNCC left message and requested call back.

## 2017-04-05 NOTE — Telephone Encounter (Signed)
Patient informed of instructions on torsemide and weights.  Has not gotten labs, plans to do this tomorrow.

## 2017-04-05 NOTE — Care Coordination-Inpatient (Signed)
RNCC received call back from patient. She reports her weight is down from 297.2 lbs on Friday to 293.6 lbs today. Her SOB has improved, although she states "I am still holding fluid; I can feel it." [facility] reviewed that cardiology had ordered blood work to be done ASAP. Patient states, "I have a sick kitty and I have to take her to the vet at 76, and I have a bunch of other running around to do, but I will try." [facility] explained the importance of the lab work. Patient states "I will try to get it done." Patient inquired about how to continue with her torsemide dose. RNCC instructed patient to reach out to cardiology for clarification. Per chart review, patient did contact cardiology.       PLAN: RNCC will follow up with patient later this week for daily weight. RNCC instructed patient to call with any needs/concerns.

## 2017-04-05 NOTE — Telephone Encounter (Signed)
Glad to see she has responded to increase in torsemide dose. Did she have her labs done as requested? Instruct her to resume torsemide 20 mg daily dose and continue to weigh daily.

## 2017-04-05 NOTE — Telephone Encounter (Signed)
Pt calling for [facility] to give her her wt on Fri 295 lbs, Sat 297 lbs, today 293 lbs. What should pt do with her medications now? Pls call to advise Thank you

## 2017-04-05 NOTE — Telephone Encounter (Signed)
I agree with the Care Coordinator's Plan of Care

## 2017-04-05 NOTE — Care Coordination-Inpatient (Signed)
encounter made in error

## 2017-04-05 NOTE — Care Coordination-Inpatient (Signed)
04/02/17 1600 RNCC spoke with Rachael Mays, Rachael Mays patient's SOB and weight gain. Rachael Mays spoke with Rachael Mays, Rachael Mays, who ordered to increase torsemide to 40 mg daily and monitor weight.     RNCC later received call from patient. RNCC explained that Mays would be call with specific instructions, but that at this time, the plan is to increase torsemide to 40 mg daily and monitor weight trend. Patient verbalized understanding.

## 2017-04-06 ENCOUNTER — Inpatient Hospital Stay: Attending: Acute Care | Primary: Sports Medicine

## 2017-04-06 DIAGNOSIS — I5032 Chronic diastolic (congestive) heart failure: Secondary | ICD-10-CM

## 2017-04-06 LAB — BASIC METABOLIC PANEL
Anion Gap: 11 (ref 3–16)
BUN: 21 mg/dL — ABNORMAL HIGH (ref 7–20)
CO2: 28 mmol/L (ref 21–32)
Calcium: 9.7 mg/dL (ref 8.3–10.6)
Chloride: 102 mmol/L (ref 99–110)
Creatinine: 1 mg/dL (ref 0.6–1.1)
GFR African American: >60 (ref >60)
GFR Non-African American: 57 — AB (ref >60)
Glucose: 106 mg/dL — ABNORMAL HIGH (ref 70–99)
Potassium: 4.3 mmol/L (ref 3.5–5.1)
Sodium: 141 mmol/L (ref 136–145)

## 2017-04-06 LAB — BRAIN NATRIURETIC PEPTIDE: Pro-BNP: 63 pg/mL (ref 0–124)

## 2017-04-07 NOTE — Telephone Encounter (Signed)
LVM with results/instructions.    Encouraged to call if any questions

## 2017-04-07 NOTE — Telephone Encounter (Signed)
-----   Message from Dorian Furnace, APRN - CNP sent at 04/07/2017  9:00 AM EDT -----  Notify patient labs look good, kidney numbers stable. Continue torsemide dose at 20 mg daily. Thanks.

## 2017-04-09 ENCOUNTER — Ambulatory Visit
Admit: 2017-04-09 | Discharge: 2017-04-09 | Payer: PRIVATE HEALTH INSURANCE | Attending: Family Medicine | Primary: Sports Medicine

## 2017-04-09 DIAGNOSIS — Z01818 Encounter for other preprocedural examination: Secondary | ICD-10-CM

## 2017-04-09 LAB — DRUG PANEL-PM-HI RES-UR INTERP-A
6-Acetylmorphine: NOT DETECTED
7-aminoclonazepam: NOT DETECTED
Alpha-OH-alprazolam: NOT DETECTED
Alprazolam: NOT DETECTED
Amphetamine: NOT DETECTED
Barbiturates: NOT DETECTED
Benzoylecgonine: NOT DETECTED
Buprenorphine: NOT DETECTED
CARISOPRODOL: NOT DETECTED
Clonazepam: NOT DETECTED
Codeine: NOT DETECTED
Creatinine, Ur: 127.4 mg/dL (ref 20.0–400.0)
Diazepam: NOT DETECTED
Ethyl Glucuronide: NOT DETECTED
Fentanyl: NOT DETECTED
Hydrocodone: NOT DETECTED
Hydromorphone: NOT DETECTED
Lorazepam: NOT DETECTED
MDA: NOT DETECTED
MDEA: NOT DETECTED
MDMA, Urine: NOT DETECTED
Marijuana Metabolite: NOT DETECTED
Meperidine: NOT DETECTED
Methadone: NOT DETECTED
Methamphetamine: NOT DETECTED
Methylphenidate: NOT DETECTED
Midazolam: NOT DETECTED
Morphine: NOT DETECTED
Norbuprenorphine: NOT DETECTED
Nordiazepam: NOT DETECTED
Norfentanyl: NOT DETECTED
Norhydrocodone, Urine: NOT DETECTED
Noroxymorphone, Urine: NOT DETECTED
OXAZEPAM: NOT DETECTED
Oxymorphone: NOT DETECTED
PCP: NOT DETECTED
Phentermine: NOT DETECTED
Propoxyphene: NOT DETECTED
TEMAZEPAM: NOT DETECTED
Tapentadol, Urine: NOT DETECTED
Tapentadol-O-Sulfate, Urine: NOT DETECTED
Tramadol: NOT DETECTED
Zolpidem: NOT DETECTED

## 2017-04-09 MED ORDER — CEPHALEXIN 500 MG PO CAPS
500 MG | ORAL_CAPSULE | Freq: Four times a day (QID) | ORAL | 0 refills | Status: DC
Start: 2017-04-09 — End: 2017-04-29

## 2017-04-09 MED ORDER — FLUCONAZOLE 150 MG PO TABS
150 MG | ORAL_TABLET | Freq: Once | ORAL | 0 refills | Status: AC
Start: 2017-04-09 — End: 2017-04-09

## 2017-04-09 NOTE — Patient Instructions (Addendum)
1. Preoperative workup as follows: none  2. Change in medication regimen before surgery: Take omeprazole, metoprolol, lisinopril on morning of surgery with sip of water, and hold all other medications until after surgery. Take other morning meds when you get home  3. No contraindications to planned surgery  4. Warm compresses for ear lobe   come back if getting worse

## 2017-04-09 NOTE — Progress Notes (Signed)
Preoperative Consultation    [name]  Date of Birth:  [birth year]  Date of Service:  04/09/2017    Chief Complaint   Patient presents with   . Pre-op Exam       This patient presents to the office today for a preoperative consultation at the request of surgeon, Dr. Jake Bathe, who plans on performing left eye Vitrectomy on May 7 at [place]. Fax # [phone].     Planned anesthesia: IV sedation   Known anesthesia problems: None   Bleeding risk: Anticoagulant therapy- asa, effient  Personal or FH of DVT/PE: Yes - PE in 1988 ( BCP), 2002 post op      Patient Active Problem List   Diagnosis   . Back pain   . Deep vein thrombosis ([place])   . Depression   . Pulmonary embolism ([place])   . Nausea & vomiting   . Abdominal  pain, other specified site   . Morbid obesity due to excess calories ([place])   . Status post gastric banding   . Vertigo   . Dizziness   . Tinnitus, subjective   . Other complications of gastric band procedure   . Essential hypertension   . Hyperglycemia   . Atypical chest pain   . 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 Adrenal mass, left ([place])   . Adrenal nodule ([place])   . Obstructive sleep apnea   . Dyspnea and respiratory abnormalities   . Abnormal stress test   . Shortness of breath   . Chronic diastolic congestive heart failure ([place])   . Coronary artery disease due to lipid rich plaque   . Pulmonary hypertension   . 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 Chest pain   . Acute actinic otitis externa of both ears   . Bilateral acute serous otitis media   . Anxiety and depression   . Bradycardia   . Dyslipidemia   . Chronic eczematous otitis externa of both ears   . Mucositis   . Sprain of radiocarpal joint of right wrist   . Idiopathic peripheral neuropathy       Past Surgical History:   Procedure Laterality Date   . BACK SURGERY      neck  plates   . BREAST SURGERY      left lumpectomy   . CESAREAN SECTION     . CHOLECYSTECTOMY     . COLONOSCOPY     .  COLONOSCOPY  04/08/10   . CORONARY ANGIOPLASTY     . DIAGNOSTIC CARDIAC CATH LAB PROCEDURE     . DILATATION, ESOPHAGUS     . ENDOSCOPY, COLON, DIAGNOSTIC     . HYSTERECTOMY     . LAP BAND  05/01/08    Dr. Nicole Cella   . OTHER SURGICAL HISTORY      [place] Filter: curently in place   . OTHER SURGICAL HISTORY  07/05/13    lap band removal   . PARTIAL HYSTERECTOMY     . SHOULDER SURGERY     . UPPER GASTROINTESTINAL ENDOSCOPY  05/19/13   . UPPER GASTROINTESTINAL ENDOSCOPY  07/21/13    ESOPHAGEAL STENT PLACEMENT   . UPPER GASTROINTESTINAL ENDOSCOPY N/A 07/31/2014    Esophagogastroduodenoscopy with esophageal balloon dilation   . VASCULAR SURGERY  04/2015    Lucendia Herrlich, celiac artery angiogram, normal abd arteries     Current Outpatient Prescriptions   Medication Sig Dispense Refill   . prasugrel (  EFFIENT) 10 MG TABS TAKE 1 TABLET BY MOUTH DAILY 90 tablet 3   . aspirin 81 MG EC tablet Take 1 tablet by mouth daily 30 tablet 11   . fluocinonide (LIDEX) 0.05 % external solution APPLY SPARINGLY TO THE BACK OF THE SCALP DAILY IF NEEDED FOR ITCHING 60 mL 1   . montelukast (SINGULAIR) 10 MG tablet TAKE ONE TABLET BY MOUTH ONE TIME A DAY 90 tablet 1   . spironolactone (ALDACTONE) 25 MG tablet Take 1.5 tablets by mouth daily 90 tablet 3   . omeprazole (PRILOSEC) 40 MG delayed release capsule Take 1 capsule by mouth daily 90 capsule 3   . traZODone (DESYREL) 100 MG tablet TAKE ONE TABLET BY MOUTH NIGHTLY 90 tablet 3   . venlafaxine (EFFEXOR XR) 150 MG extended release capsule Take 1 capsule by mouth daily 90 capsule 3   . nitroGLYCERIN (NITROSTAT) 0.4 MG SL tablet up to max of 3 total doses. If no relief after 1 dose, call 911. 25 tablet 3   . metoprolol (LOPRESSOR) 100 MG tablet Take 0.5 tablets by mouth 2 times daily 270 tablet 1   . lisinopril (PRINIVIL;ZESTRIL) 40 MG tablet Take 1 tablet by mouth daily 90 tablet 2   . Elastic Bandages & Supports (WRIST SPLINT/COCK-UP/LEFT M) MISC 1 Device by Does not apply route daily as needed (wrist  pain) 1 each 0   . torsemide (DEMADEX) 20 MG tablet Take 2 tablets by mouth daily 180 tablet 3   . triamcinolone (KENALOG) 0.1 % cream Apply topically 2 times daily.Apply with Q tip in each ear BID for 1 week. 15 g 1   . mupirocin (BACTROBAN) 2 % ointment Apply topically  Daily.Apply with Q tip in each naris at night 15 g 0   . oxyCODONE-acetaminophen (PERCOCET) 5-325 MG per tablet Take 1 tablet by mouth every 6 hours as needed for Pain . 40 tablet 0   . albuterol sulfate HFA (PROVENTIL HFA) 108 (90 BASE) MCG/ACT inhaler Inhale 2 puffs into the lungs every 6 hours as needed for Wheezing 3 Inhaler 3   . Multiple Vitamins-Minerals (MULTIVITAMIN PO) Take 1 tablet by mouth daily      . CALCIUM-VITAMIN D PO Take 1 tablet by mouth daily      . cetirizine (ZYRTEC ALLERGY) 10 MG tablet Take 10 mg by mouth daily.       No current facility-administered medications for this visit.        Allergies   Allergen Reactions   . Morphine Itching   . Seasonal    . [place] [Pentazocine] Other (See Comments)     dizzy     Outpatient Prescriptions Marked as Taking for the 04/09/17 encounter (Office Visit) with Jamal Collin, MD   Medication Sig Dispense Refill   . prasugrel (EFFIENT) 10 MG TABS TAKE 1 TABLET BY MOUTH DAILY 90 tablet 3   . aspirin 81 MG EC tablet Take 1 tablet by mouth daily 30 tablet 11   . fluocinonide (LIDEX) 0.05 % external solution APPLY SPARINGLY TO THE BACK OF THE SCALP DAILY IF NEEDED FOR ITCHING 60 mL 1   . montelukast (SINGULAIR) 10 MG tablet TAKE ONE TABLET BY MOUTH ONE TIME A DAY 90 tablet 1   . spironolactone (ALDACTONE) 25 MG tablet Take 1.5 tablets by mouth daily 90 tablet 3   . omeprazole (PRILOSEC) 40 MG delayed release capsule Take 1 capsule by mouth daily 90 capsule 3   . traZODone (DESYREL) 100 MG  tablet TAKE ONE TABLET BY MOUTH NIGHTLY 90 tablet 3   . venlafaxine (EFFEXOR XR) 150 MG extended release capsule Take 1 capsule by mouth daily 90 capsule 3   . nitroGLYCERIN (NITROSTAT) 0.4 MG SL tablet up  to max of 3 total doses. If no relief after 1 dose, call 911. 25 tablet 3   . metoprolol (LOPRESSOR) 100 MG tablet Take 0.5 tablets by mouth 2 times daily 270 tablet 1   . lisinopril (PRINIVIL;ZESTRIL) 40 MG tablet Take 1 tablet by mouth daily 90 tablet 2   . Elastic Bandages & Supports (WRIST SPLINT/COCK-UP/LEFT M) MISC 1 Device by Does not apply route daily as needed (wrist pain) 1 each 0   . torsemide (DEMADEX) 20 MG tablet Take 2 tablets by mouth daily 180 tablet 3   . triamcinolone (KENALOG) 0.1 % cream Apply topically 2 times daily.Apply with Q tip in each ear BID for 1 week. 15 g 1   . mupirocin (BACTROBAN) 2 % ointment Apply topically  Daily.Apply with Q tip in each naris at night 15 g 0   . oxyCODONE-acetaminophen (PERCOCET) 5-325 MG per tablet Take 1 tablet by mouth every 6 hours as needed for Pain . 40 tablet 0   . albuterol sulfate HFA (PROVENTIL HFA) 108 (90 BASE) MCG/ACT inhaler Inhale 2 puffs into the lungs every 6 hours as needed for Wheezing 3 Inhaler 3   . Multiple Vitamins-Minerals (MULTIVITAMIN PO) Take 1 tablet by mouth daily      . CALCIUM-VITAMIN D PO Take 1 tablet by mouth daily      . cetirizine (ZYRTEC ALLERGY) 10 MG tablet Take 10 mg by mouth daily.         Social History   Substance Use Topics   . Smoking status: Former Smoker     Packs/day: 0.50     Years: 40.00     Types: Cigarettes     Quit date: 08/07/2012   . Smokeless tobacco: Former Systems developer     Quit date: 06/16/2013      Comment: started to smoke at age 69 / only smoked 0.5 p.p.d    . Alcohol use No   we consulted Dr Wesley Desanctis office about hre oral anticoagulants and they did not feel that she had to stop them    Review of Systems  A comprehensive review of systems was negative except for: Eyes: positive for visual disturbance  Cardiovascular: positive for diastolic CHF controlled with salt restriction and meds  Neurological: positive for paresthesia and peripheral neuropathy     Physical Exam   BP 126/76 (Site: Right Arm, Position:  Sitting, Cuff Size: Large Adult)   Pulse 68   Ht 5' 5.75" (1.67 m)   Wt 294 lb (133.4 kg)   SpO2 97%   BMI 47.81 kg/m   Weight: 294 lb (133.4 kg)   Constitutional: She is oriented to person, place, and time. She appears well-developed and well-nourished. No distress.   HENT: infected cyst left ear lobe  Head: Normocephalic and atraumatic.   Mouth/Throat: Uvula is midline, oropharynx is clear and moist and mucous membranes are normal.   Eyes: Conjunctivae and EOM are normal. Pupils are equal, round, and reactive to light.   Neck: Trachea normal and normal range of motion. Neck supple. No JVD present. Carotid bruit is not present. No mass and no thyromegaly present.   Cardiovascular: Normal rate, regular rhythm, normal heart sounds and intact distal pulses.  Exam reveals no gallop and no friction rub.  No murmur heard.  Pulmonary/Chest: Effort normal and breath sounds normal. No respiratory distress. She has no wheezes. She has no rales.   Abdominal: Soft. Normal aorta and bowel sounds are normal. She exhibits no distension and no mass. There is no hepatosplenomegaly. No tenderness.   Musculoskeletal: She exhibits no edema and no tenderness.   Neurological: She is alert and oriented to person, place, and time. She has normal strength. No cranial nerve deficit or sensory deficit. Coordination and gait normal.   Skin: Skin is warm and dry. No rash noted. No erythema.     EKG Interpretation:  normal EKG, normal sinus rhythm, poor r -wave progression, unchanged from previous tracings.    Lab Review Yes< BNP anf BMP good 3 days ago 5/1       Assessment:        Diagnosis Orders   1. Pre-op exam     2. Chronic diastolic congestive heart failure ([place])     3. Coronary artery disease due to lipid rich plaque     4. Morbid obesity due to excess calories ([place])     5. Obstructive sleep apnea     6. Infected sebaceous cyst         There are no diagnoses linked to this encounter.  61 y.o. patient  approved for Surgery          Plan:     1. Preoperative workup as follows: none  2. Change in medication regimen before surgery: Take omeprazole, metoprolol, lisinopril on morning of surgery with sip of water, and hold all other medications until after surgery. Take other morning meds when you get home  3. No contraindications to planned surgery  4. Keflex and diflucan and warm compresses for earlobe infection      Note electronically signed by provider.

## 2017-04-11 ENCOUNTER — Encounter

## 2017-04-12 MED ORDER — METOPROLOL TARTRATE 100 MG PO TABS
100 MG | ORAL_TABLET | ORAL | 1 refills | Status: DC
Start: 2017-04-12 — End: 2019-02-24

## 2017-04-12 MED ORDER — LISINOPRIL 40 MG PO TABS
40 MG | ORAL_TABLET | ORAL | 1 refills | Status: DC
Start: 2017-04-12 — End: 2017-11-14

## 2017-04-28 ENCOUNTER — Encounter: Attending: Cardiovascular Disease | Primary: Sports Medicine

## 2017-04-29 ENCOUNTER — Ambulatory Visit
Admit: 2017-04-29 | Discharge: 2017-04-29 | Payer: PRIVATE HEALTH INSURANCE | Attending: Adult Health | Primary: Sports Medicine

## 2017-04-29 DIAGNOSIS — I2583 Coronary atherosclerosis due to lipid rich plaque: Secondary | ICD-10-CM

## 2017-04-29 MED ORDER — RANOLAZINE ER 500 MG PO TB12
500 MG | ORAL_TABLET | Freq: Two times a day (BID) | ORAL | 3 refills | Status: DC
Start: 2017-04-29 — End: 2017-07-06

## 2017-04-29 NOTE — Progress Notes (Signed)
Rachael Mays     Outpatient Follow Up Note    Rachael Mays is 61 y.o. female who presents today with a history of CAD s/p PTCA LAD April '17, d-HF, DVT/PE, s/p Green[place] filter, HTN and hyperlipidemia    She has been experiencing chronic chest pain and shortness of breath since April 2017  (03/2016) RHC was also performed which showed PA mean 33 mmHg, PCWP 18 mmHg (transpulmonary gradient 15), PVR 2.134 Wood units.  Symptoms have not improved post stent. She has seen Dr Jeb Levering and CTA/CTPA (03/2016) with no evidence of PE or acute abnormality.   Echo 04/06/16 with concentric LVH, EF 65% and RVSP 42 mmHg.   Repeat LHC (07/2016) for shortness of breath and chest pain demonstrated patent stent LAD.   She was seen in office 01/11/2017 by Dr Jacqualyn Posey and imipramine was to start for chronic chest pain. imipramine in women with chronic chest pain (Syndrome X)    CHIEF COMPLAINT / HPI:  Follow Up secondary to CAD    Subjective:   she feels like crap. She has pain in her chest and when it gets bad her arms hurt & left shoulder, legs get week and has trouble breathing. It comes on irregardless of activities. She takes NTG SL and it eases up then takes it away. She has pain a couple of times throughout the day.     There is SOB at times, ie going up steps and walking once around her block (or 5-6 minutes). When she works out in her yard, she develops jaw pain, difficulty breathing then her chest hurts. She sleeps in a recliner for comfort and to breath easier; she actually moved to the recliner after having eye surgery a couple of weeks ago. She uses her BiPAP. She denies PND. When she wakes her legs feel tight which is associated with a weight gain. She'll take an extra Demadex then the swelling goes down. The patients weight was 296# today; her baseline wt is in the higher 80s . The patient is not experiencing palpitations. She has a lot of light headedness unrelated to a BP change. Her home BP runs ~140/80.     These  symptoms show no change since the last OV.   With regard to medication therapy the patient has been compliant with prescribed regimen. They have tolerated therapy to date.     Past Medical History:   Diagnosis Date   . Anxiety    . Asthma    . Blood circulation, collateral    . CAD (coronary artery disease)    . CHF (congestive heart failure) ([place])    . Chronic back pain    . Deep vein thrombosis    . Depression    . GERD (gastroesophageal reflux disease)     NO LONGER SINCE LAP   . GERD (gastroesophageal reflux disease) 01/23/2009   . Hx of blood clots    . Hyperlipidemia     hx; resolved with lap band   . Hypertension    . Idiopathic peripheral neuropathy 03/26/2017   . Obesity     hx of; had lap band   . Obstructive sleep apnea (adult) (pediatric) 09/18/2015   . Pulmonary embolism ([place])      Social History:    History   Smoking Status   . Former Smoker   . Packs/day: 0.50   . Years: 40.00   . Types: Cigarettes   . Quit date: 08/07/2012   Smokeless Tobacco   .  Former Systems developer   . Quit date: 06/16/2013     Comment: started to smoke at age 35 / only smoked 0.5 p.p.d      Current Medications:  Current Outpatient Prescriptions   Medication Sig Dispense Refill   . metoprolol (LOPRESSOR) 100 MG tablet TAKE 1 AND 1/2 TABLET BY MOUTH TWO TIMES A DAY 270 tablet 1   . lisinopril (PRINIVIL;ZESTRIL) 40 MG tablet TAKE ONE TABLET BY MOUTH ONE TIME A DAY 90 tablet 1   . prasugrel (EFFIENT) 10 MG TABS TAKE 1 TABLET BY MOUTH DAILY 90 tablet 3   . aspirin 81 MG EC tablet Take 1 tablet by mouth daily 30 tablet 11   . fluocinonide (LIDEX) 0.05 % external solution APPLY SPARINGLY TO THE BACK OF THE SCALP DAILY IF NEEDED FOR ITCHING 60 mL 1   . montelukast (SINGULAIR) 10 MG tablet TAKE ONE TABLET BY MOUTH ONE TIME A DAY 90 tablet 1   . spironolactone (ALDACTONE) 25 MG tablet Take 1.5 tablets by mouth daily 90 tablet 3   . omeprazole (PRILOSEC) 40 MG delayed release capsule Take 1 capsule by mouth daily 90 capsule 3   . traZODone (DESYREL) 100  MG tablet TAKE ONE TABLET BY MOUTH NIGHTLY 90 tablet 3   . venlafaxine (EFFEXOR XR) 150 MG extended release capsule Take 1 capsule by mouth daily 90 capsule 3   . nitroGLYCERIN (NITROSTAT) 0.4 MG SL tablet up to max of 3 total doses. If no relief after 1 dose, call 911. 25 tablet 3   . torsemide (DEMADEX) 20 MG tablet Take 2 tablets by mouth daily (Patient taking differently: Take 20 mg by mouth daily Takes an extra dose for a wt gain > 3#) 180 tablet 3   . triamcinolone (KENALOG) 0.1 % cream Apply topically 2 times daily.Apply with Q tip in each ear BID for 1 week. 15 g 1   . mupirocin (BACTROBAN) 2 % ointment Apply topically  Daily.Apply with Q tip in each naris at night 15 g 0   . oxyCODONE-acetaminophen (PERCOCET) 5-325 MG per tablet Take 1 tablet by mouth every 6 hours as needed for Pain . 40 tablet 0   . Multiple Vitamins-Minerals (MULTIVITAMIN PO) Take 1 tablet by mouth daily      . CALCIUM-VITAMIN D PO Take 1 tablet by mouth daily      . cetirizine (ZYRTEC ALLERGY) 10 MG tablet Take 10 mg by mouth daily.     Marland Kitchen albuterol sulfate HFA (PROVENTIL HFA) 108 (90 BASE) MCG/ACT inhaler Inhale 2 puffs into the lungs every 6 hours as needed for Wheezing 3 Inhaler 3     No current facility-administered medications for this visit.      REVIEW OF SYSTEMS:    CONSTITUTIONAL: No major weight gain or loss, fatigue, weakness, night sweats or fever.  HEENT: No new vision difficulties or ringing in the ears.  RESPIRATORY: No new SOB, PND, orthopnea or cough.   CARDIOVASCULAR: See HPI  GI: No nausea, vomiting, diarrhea, constipation, abdominal pain or changes in bowel habits.  GU: No urinary frequency, urgency, incontinence hematuria or dysuria.  SKIN: No cyanosis or skin lesions.  MUSCULOSKELETAL: No new muscle or joint pain.  NEUROLOGICAL: No syncope or TIA-like symptoms.  PSYCHIATRIC: No anxiety, pain, insomnia or depression    Objective:   PHYSICAL EXAM:        Vitals:    04/29/17 1530 04/29/17 1605   BP: 102/60 110/64    Site: Left Arm Right Arm  Position: Sitting    Cuff Size: Large Adult Large Adult   Pulse: 62    Weight: 295 lb (133.8 kg)    Height: 5\' 6"  (1.676 m)        VITALS:  BP 110/64 (Site: Right Arm, Cuff Size: Large Adult)   Pulse 62   Ht 5\' 6"  (1.676 m)   Wt 295 lb (133.8 kg)   BMI 47.61 kg/m   CONSTITUTIONAL: Cooperative, no apparent distress, and appears well nourished / obese  NEUROLOGIC:  Awake and orientated to person, place and time.  PSYCH: Calm affect.  SKIN: Warm and dry.  HEENT: Sclera non-icteric, normocephalic, neck supple, no elevation of JVP, normal carotid pulses with no bruits and thyroid normal size.  LUNGS:  No increased work of breathing and clear to auscultation, no crackles or wheezing  CARDIOVASCULAR:  Regular rate 68 and rhythm with no murmurs, gallops, rubs, or abnormal heart sounds, normal PMI.The apical impulses not displaced  JVP less than 8 cm H2O  Heart tones are crisp and normal  Cervical veins are not engorged  The carotid upstroke is normal in amplitude and contour without delay or bruit  JVP is not elevated  ABDOMEN:  Normal bowel sounds, non-distended and non-tender to palpation  EXT: No edema, no calf tenderness. Pulses are present bilaterally.    DATA:    Lab Results   Component Value Date    ALT 44 (H) 02/15/2017    AST 42 (H) 02/15/2017    ALKPHOS 84 02/15/2017    BILITOT 0.3 02/15/2017     Lab Results   Component Value Date    CREATININE 1.0 04/06/2017    BUN 21 (H) 04/06/2017    NA 141 04/06/2017    K 4.3 04/06/2017    CL 102 04/06/2017    CO2 28 04/06/2017     Lab Results   Component Value Date    WBC 7.0 12/20/2016    HGB 13.5 12/20/2016    HCT 39.8 12/20/2016    MCV 88.7 12/20/2016    PLT 197 12/20/2016     No components found for: CHLPL  Lab Results   Component Value Date    TRIG 114 06/17/2016    TRIG 100 05/21/2016    TRIG 137 05/04/2014     Lab Results   Component Value Date    HDL 43 06/17/2016    HDL 42 05/21/2016    HDL 49 05/04/2014     Lab Results   Component  Value Date    LDLCALC 52 06/17/2016    LDLCALC 65 05/21/2016    LDLCALC 132 (H) 05/04/2014     Lab Results   Component Value Date    LABVLDL 23 06/17/2016    LABVLDL 20 05/21/2016    LABVLDL 27 05/04/2014     Radiology Review:  Pertinent images / reports were reviewed as a part of this visit and reveals the following:    Cardiac Cath 07/17/16:  Anatomy:   LM-normal  LAD-widely patent stent otherwise normal  Cx-normal, dominant  OM1- normal  RCA-normal  LVEF- 70%, LVEDP 15 mmHg    Impression:  1.  Widely patent LAD stent otherwise normal coronary arteries  2.  Hyperdynamic LV systolic function  3.  Non-cardiac chest pain     R/L Cardiac Cath 03/31/16:  Anatomy:   LM-normal   LAD-70% mid with FFR 0.77  Cx-normal, codominant  OM1- normal  RCA-normal  RPDA- normal  LVEF- 70%, LVEDP 19   PCI:  LAD 70% to 0% with 3.25 mm x 18 mm Xience Alpine drug-eluting stent     Hemodynamics:  RA-15/12 mean 9  RV- 60/0, 9  PAWP-20/26 mean 19  PA- 60/12 mean 33       Last Echo: May '17:  Summary  Normal left ventricle size and systolic function with an estimated ejectionfraction of 65%.  There is mild concentric left ventricular hypertrophy.  Mild mitral regurgitation is present.  The aortic valve appears sclerotic but opens well.  There is mild tricuspid regurgitation with RVSP estimated at 42 mmHg.  Mild pulmonic regurgitation present.  Definity was used to better delineate endocardium.    Last Stress Test: '16:  Summary   -Moderate sized anterior mostly reversible defect consistent with ischemia   in the territory of the mid LAD .   -Normal LV function.   -Study is limited by breast attenuation.       Assessment:      Diagnosis Orders   1. Coronary artery disease due to lipid rich plaque   ~s/p PTCA LAD '17  ~continues to have chest discomfort: does not recognize imipramine recommended and therefore not taking  ~has remained on DAPT / statin / BB    2. Chronic diastolic congestive heart failure ([place])   ~appears compensated  on current regimen. Neg to exam for crackles/edema  ~routinely takes torsemide / spironolactone / lisinopril   ~SOB multifactorial: deconditioning with body habitus vs OSA    3. Essential hypertension   ~controlled     4. Dyslipidemia   ~controlled : intolerant to statins          I had the opportunity to review the clinical symptoms and presentation of YADHIRA MCKNEELY.   Plan:     1. Trial of Ranexa 500 mg bid: syndrome X  2. f/u in 4 weeks    Overall the patient is stable from CV standpoint    I have addresed the patient's cardiac risk factors and adjusted pharmacologic treatment as needed. In addition, I have reinforced the need for patient directed risk factor modification.    Further evaluation will be based upon the patient's clinical course and testing results.    All questions and concerns were addressed to the patient. Alternatives to my treatment were discussed.      The patient is not currently smoking. The risks related to smoking were reviewed with the patient. Recommend maintaining a smoke-free lifestyle.    Patient is on a beta-blocker  Patient is on an ace-i  Patient is not on a statin: intolerant     Dual Antiplatelet therapy has been recommended / prescribed for this patient. Education conducted on adverse reactions including bleeding was discussed.     Daily weight, low sodium diet were discussed. Patient instructed to call the office with a weight gain: > 3 # over night or 5# in one week; swelling, SOB/orthopnea/PND    The patient verbalizes understanding not to stop medications without discussing with [place].    Discussed exercise: 30-60 minutes 7 days/week  Discussed Low saturated fat/NAS diet.     Thank you for allowing to [place] to participate in the care of JAQUASHA CARNEVALE.    Luiz Iron, APRN    Documentation of today's visit sent to PCP

## 2017-04-29 NOTE — Patient Instructions (Signed)
Trial of Ranexa 500 mg twice a day    appt in 4 weeks

## 2017-04-30 NOTE — Telephone Encounter (Signed)
Pt called and when she received the text from [facility] to pick up the script for Ranexa she was advised it was $70 and pt can not afford it.  Please adv if there is a cheaper alternative or an assistance program.    Please call to adv.  Thank you [facility]

## 2017-04-30 NOTE — Telephone Encounter (Signed)
Rachael Mays just started her on Ranexa 500mg  bid yesterday, gave her two weeks samples, has f/u with her OV 06/01/17.    Thx!

## 2017-05-02 ENCOUNTER — Encounter

## 2017-05-04 ENCOUNTER — Encounter: Admit: 2017-05-05 | Primary: Sports Medicine

## 2017-05-04 MED ORDER — OMEPRAZOLE 40 MG PO CPDR
40 MG | ORAL_CAPSULE | ORAL | 0 refills | Status: DC
Start: 2017-05-04 — End: 2017-05-07

## 2017-05-04 NOTE — ED Provider Notes (Signed)
[facility] Emergency Department      Pt Name: Rachael Mays  MRN: [id number]  [place] 1956-02-11  Date of evaluation: 05/04/2017  Provider: Uvaldo Rising, MD  CHIEF COMPLAINT  Chief Complaint   Patient presents with   . Chest Pain     and SOB x1 hour. 2 325mg  asa and 1 nitro with relief but now coming back. left sided into left arm.      HPI  Rachael Mays is a 61 y.o. female who presents because of chest discomfort.  Pain character is an ache and is left sided radiating to her left arm.  It feels similar to pain she had associated with her heart.  She has a history of coronary artery disease and had a stent placed over a year ago.  She is on aspirin daily.  She took a dose of aspirin tonight and also nitroglycerin.  The pain initially went away but then it came back.  She was at rest when the pain started.  She also has some shortness of breath and nausea.  She has a history of congestive heart failure and has noticed that her weight has increased 7 pounds.  She also notes some swelling in her legs.  She does have a history of blood clot twice in her life time.  One time she was using birth control pills and another time it was after surgical procedure.  She says that this pain is different and recalls having sharp chest pain the times when she had a blood clot.    REVIEW OF SYSTEMS:  No fever, no abdominal pain, no trauma, no recent travel, swelling Pertinent positives and negatives as per the HPI.  All other review of systems reviewed and negative.  Nursing notes reviewed.    PAST MEDICAL HISTORY  Past Medical History:   Diagnosis Date   . Anxiety    . Asthma    . Blood circulation, collateral    . CAD (coronary artery disease)    . CHF (congestive heart failure) ([place])    . Chronic back pain    . Deep vein thrombosis    . Depression    . GERD (gastroesophageal reflux disease)     NO LONGER SINCE LAP   . GERD (gastroesophageal reflux disease) 01/23/2009   . Hx of blood clots    . Hyperlipidemia      hx; resolved with lap band   . Hypertension    . Idiopathic peripheral neuropathy 03/26/2017   . Obesity     hx of; had lap band   . Obstructive sleep apnea (adult) (pediatric) 09/18/2015   . Pulmonary embolism ([place])      SURGICAL HISTORY  Past Surgical History:   Procedure Laterality Date   . BACK SURGERY      neck  plates   . BREAST SURGERY      left lumpectomy   . CESAREAN SECTION     . CHOLECYSTECTOMY     . COLONOSCOPY     . COLONOSCOPY  04/08/10   . CORONARY ANGIOPLASTY     . DIAGNOSTIC CARDIAC CATH LAB PROCEDURE     . DILATATION, ESOPHAGUS     . ENDOSCOPY, COLON, DIAGNOSTIC     . HYSTERECTOMY     . LAP BAND  05/01/08    Dr. Nicole Cella   . OTHER SURGICAL HISTORY      [place] Filter: curently in place   . OTHER SURGICAL HISTORY  07/05/13  lap band removal   . PARTIAL HYSTERECTOMY     . SHOULDER SURGERY     . UPPER GASTROINTESTINAL ENDOSCOPY  05/19/13   . UPPER GASTROINTESTINAL ENDOSCOPY  07/21/13    ESOPHAGEAL STENT PLACEMENT   . UPPER GASTROINTESTINAL ENDOSCOPY N/A 07/31/2014    Esophagogastroduodenoscopy with esophageal balloon dilation   . VASCULAR SURGERY  04/2015    Lucendia Herrlich, celiac artery angiogram, normal abd arteries     MEDICATIONS:  No current facility-administered medications on file prior to encounter.      Current Outpatient Prescriptions on File Prior to Encounter   Medication Sig Dispense Refill   . omeprazole (PRILOSEC) 40 MG delayed release capsule TAKE ONE CAPSULE BY MOUTH ONE TIME A DAY 90 capsule 0   . ranolazine (RANEXA) 500 MG extended release tablet Take 1 tablet by mouth 2 times daily 60 tablet 3   . metoprolol (LOPRESSOR) 100 MG tablet TAKE 1 AND 1/2 TABLET BY MOUTH TWO TIMES A DAY 270 tablet 1   . lisinopril (PRINIVIL;ZESTRIL) 40 MG tablet TAKE ONE TABLET BY MOUTH ONE TIME A DAY 90 tablet 1   . prasugrel (EFFIENT) 10 MG TABS TAKE 1 TABLET BY MOUTH DAILY 90 tablet 3   . aspirin 81 MG EC tablet Take 1 tablet by mouth daily 30 tablet 11   . fluocinonide (LIDEX) 0.05 % external solution APPLY  SPARINGLY TO THE BACK OF THE SCALP DAILY IF NEEDED FOR ITCHING 60 mL 1   . montelukast (SINGULAIR) 10 MG tablet TAKE ONE TABLET BY MOUTH ONE TIME A DAY 90 tablet 1   . spironolactone (ALDACTONE) 25 MG tablet Take 1.5 tablets by mouth daily 90 tablet 3   . traZODone (DESYREL) 100 MG tablet TAKE ONE TABLET BY MOUTH NIGHTLY 90 tablet 3   . venlafaxine (EFFEXOR XR) 150 MG extended release capsule Take 1 capsule by mouth daily 90 capsule 3   . nitroGLYCERIN (NITROSTAT) 0.4 MG SL tablet up to max of 3 total doses. If no relief after 1 dose, call 911. 25 tablet 3   . torsemide (DEMADEX) 20 MG tablet Take 2 tablets by mouth daily (Patient taking differently: Take 20 mg by mouth daily Takes an extra dose for a wt gain > 3#) 180 tablet 3   . triamcinolone (KENALOG) 0.1 % cream Apply topically 2 times daily.Apply with Q tip in each ear BID for 1 week. 15 g 1   . mupirocin (BACTROBAN) 2 % ointment Apply topically  Daily.Apply with Q tip in each naris at night 15 g 0   . oxyCODONE-acetaminophen (PERCOCET) 5-325 MG per tablet Take 1 tablet by mouth every 6 hours as needed for Pain . 40 tablet 0   . albuterol sulfate HFA (PROVENTIL HFA) 108 (90 BASE) MCG/ACT inhaler Inhale 2 puffs into the lungs every 6 hours as needed for Wheezing 3 Inhaler 3   . Multiple Vitamins-Minerals (MULTIVITAMIN PO) Take 1 tablet by mouth daily      . CALCIUM-VITAMIN D PO Take 1 tablet by mouth daily      . cetirizine (ZYRTEC ALLERGY) 10 MG tablet Take 10 mg by mouth daily.       ALLERGIES  Morphine; Seasonal; and Talwin [pentazocine]  FAMILY HISTORY:  Family History   Problem Relation Age of Onset   . Arthritis Mother    . Cancer Mother    . Depression Mother    . High Blood Pressure Mother    . Heart Disease Mother 26  MI    . Ovarian Cancer Mother 12   . Cancer Father 76     colon   . Heart Disease Maternal Grandmother    . Early Death Paternal Grandmother    . Heart Disease Paternal Grandfather    . Diabetes Other    . High Blood Pressure Other     . Obesity Other    . Other Sister      OSA   . Other Brother      OSA   . Other Brother      OSA   . Other Daughter      OSA     SOCIAL HISTORY:  Social History   Substance Use Topics   . Smoking status: Former Smoker     Packs/day: 0.50     Years: 40.00     Types: Cigarettes     Quit date: 08/07/2012   . Smokeless tobacco: Former Systems developer     Quit date: 06/16/2013      Comment: started to smoke at age 38 / only smoked 0.5 p.p.d    . Alcohol use No     IMMUNIZATIONS:  Noncontributory    PHYSICAL EXAM  VITAL SIGNS:  BP 125/73   Pulse 76   Temp 98.8 F (37.1 C)   Resp 18   SpO2 95%   Constitutional:  61 y.o. female who does not appear toxic  HENT:  Atraumatic, mucous membranes moist  Eyes:   Conjunctiva clear, no icterus  Neck:  Supple, no adenopathy, no visible JVD  Cardiovascular:  Regular, no rubs, no discernible murmur  Thorax & Lungs:  No accessory muscle usage, clear  Abdomen:  Soft, non distended, no pulsatility or bruits, bowel sounds present, no tenderness  Back:  No deformity  Genitalia:  Deferred  Rectal:  Deferred  Extremities:  No cyanosis, radial pulses symmetric, no venous cords or calf tenderness, trace edema  Skin:  Warm, dry  Neurologic:  Alert, no slurred speech, no focal deficits noted  Psychiatric:  Affect appropriate    DIAGNOSTIC RESULTS:  Labs resulted at the time of this note reviewed.  Labs Reviewed   COMPREHENSIVE METABOLIC PANEL W/ REFLEX TO MG FOR LOW K - Abnormal; Notable for the following:        Result Value    Glucose 150 (*)     BUN 23 (*)     CREATININE 1.2 (*)     GFR Non-African American 46 (*)     GFR African American 56 (*)     ALT 41 (*)     All other components within normal limits    Narrative:     Performed at:  [facility]  [address],  [place] [address]   Phone [phone]   APTT - Abnormal; Notable for the following:     aPTT 23.4 (*)     All other components within normal limits    Narrative:     Performed at:  [facility]  [address],  [place] [address]   Phone [phone]   D-DIMER, QUANTITATIVE - Abnormal; Notable for the following:     D-Dimer, Quant 414 (*)     All other components within normal limits    Narrative:     Performed at:  [facility]  [address]  [place] [address]   Phone [phone]   [place]  DIFFERENTIAL    Narrative:     Performed at:  [facility]  [address]  [place] [address]   Phone [phone]   TROPONIN    Narrative:     Performed at:  [facility]  [address],  [place] [address]   Phone [phone]   PROTIME-INR    Narrative:     Performed at:  [facility]  [address]  [place] [address]   Phone [phone]   TROPONIN     EKG:  Read by me in the absence of a cardiologist shows:  Sinus rhythm, rate 78, no ectopy, intervals normal, axis -12, no acute injury pattern, delayed R-wave progression, comparison with 20 December 2016 shows no major change    RADIOLOGY:    Plain x-rays were viewed by me:   XR CHEST STANDARD (2 VW)   Final Result   No acute disease.           ED COURSE:    Medications administered:  Medications   nitroGLYCERIN (NITROSTAT) SL tablet 0.4 mg (not administered)   iopamidol (ISOVUE-370) 76 % injection 90 mL (not administered)   HYDROmorphone (DILAUDID) injection 1 mg (not administered)   HYDROmorphone (DILAUDID) injection 0.5 mg (0.5 mg Intravenous Given 05/05/17 0317)   ondansetron (ZOFRAN) injection 4 mg (4 mg Intravenous Given 05/05/17 0317)   nitroglycerin (NITRO-BID) 2 % ointment 1 inch (1 inch Topical Given 05/05/17 0524)   HYDROmorphone (DILAUDID) injection 0.5 mg (0.5 mg Intravenous Given 05/05/17 0524)     PROCEDURES:  None    CRITICAL CARE:  None    CONSULTATIONS:  Hospitalist     MEDICAL DECISION MAKING: EMMANUELLE HIBBITTS is a 61 y.o. female who presented because of chest  discomfort.  She equates her discomfort with anginal pain.  She has a history of PE and d-dimer was elevated but upon reviewing her records, she has never had a normal d-dimer and has had multiple negative CT scans of her chest to look for PE.  She refused CT scan today.  Initial troponin is negative.  I discussed the case in full with the admitting physician.      Differential Diagnosis: ACS, pneumonia, pneumothorax, PE, aortic dissection, myocarditis, pericarditis, other    FINAL IMPRESSION:    1. Chest pain, unspecified type        (Please note that I used voice recognition software to generate this note.  Occasionally words are mistranscribed despite my efforts to edit errors.)        Dionne Milo, MD  05/05/17 [phone]

## 2017-05-05 ENCOUNTER — Inpatient Hospital Stay
Admission: EM | Admit: 2017-05-05 | Discharge: 2017-05-07 | Disposition: A | Discharge status: Home / Self Care | Payer: PRIVATE HEALTH INSURANCE | Source: Home / Self Care | Admitting: Internal Medicine

## 2017-05-05 ENCOUNTER — Emergency Department: Primary: Sports Medicine

## 2017-05-05 DIAGNOSIS — R079 Chest pain, unspecified: Principal | ICD-10-CM

## 2017-05-05 LAB — PROTIME-INR
INR: 0.88 (ref 0.85–1.15)
Protime: 10 s (ref 9.6–13.0)

## 2017-05-05 LAB — COMPREHENSIVE METABOLIC PANEL W/ REFLEX TO MG FOR LOW K
ALT: 41 U/L — ABNORMAL HIGH (ref 10–40)
AST: 28 U/L (ref 15–37)
Albumin/Globulin Ratio: 1.2 (ref 1.1–2.2)
Albumin: 3.9 g/dL (ref 3.4–5.0)
Alkaline Phosphatase: 89 U/L (ref 40–129)
Anion Gap: 13 (ref 3–16)
BUN: 23 mg/dL — ABNORMAL HIGH (ref 7–20)
CO2: 25 mmol/L (ref 21–32)
Calcium: 9.8 mg/dL (ref 8.3–10.6)
Chloride: 103 mmol/L (ref 99–110)
Creatinine: 1.2 mg/dL — ABNORMAL HIGH (ref 0.6–1.1)
GFR African American: 56 — AB (ref >60)
GFR Non-African American: 46 — AB (ref >60)
Globulin: 3.2 g/dL
Glucose: 150 mg/dL — ABNORMAL HIGH (ref 70–99)
Potassium reflex Magnesium: 3.9 mmol/L (ref 3.5–5.1)
Sodium: 141 mmol/L (ref 136–145)
Total Bilirubin: <0.2 mg/dL (ref 0.0–1.0)
Total Protein: 7.1 g/dL (ref 6.4–8.2)

## 2017-05-05 LAB — CBC WITH AUTO DIFFERENTIAL
Basophils %: 0.6 %
Basophils Absolute: 0 10*3/uL (ref 0.0–0.2)
Eosinophils %: 4.2 %
Eosinophils Absolute: 0.3 10*3/uL (ref 0.0–0.6)
Hematocrit: 38.9 % (ref 36.0–48.0)
Hemoglobin: 13.1 g/dL (ref 12.0–16.0)
Lymphocytes %: 40.3 %
Lymphocytes Absolute: 3 10*3/uL (ref 1.0–5.1)
MCH: 29.6 pg (ref 26.0–34.0)
MCHC: 33.8 g/dL (ref 31.0–36.0)
MCV: 87.6 fL (ref 80.0–100.0)
MPV: 8.6 fL (ref 5.0–10.5)
Monocytes %: 9.7 %
Monocytes Absolute: 0.7 10*3/uL (ref 0.0–1.3)
Neutrophils %: 45.2 %
Neutrophils Absolute: 3.4 10*3/uL (ref 1.7–7.7)
Platelets: 179 10*3/uL (ref 135–450)
RBC: 4.43 M/uL (ref 4.00–5.20)
RDW: 13.4 % (ref 12.4–15.4)
WBC: 7.4 10*3/uL (ref 4.0–11.0)

## 2017-05-05 LAB — BRAIN NATRIURETIC PEPTIDE: Pro-BNP: 31 pg/mL (ref 0–124)

## 2017-05-05 LAB — TROPONIN
Troponin: <0.01 ng/mL (ref <0.01)
Troponin: <0.01 ng/mL (ref <0.01)

## 2017-05-05 LAB — NM MYOCARDIAL SPECT REST EXERCISE OR RX: Left Ventricular Ejection Fraction: 66

## 2017-05-05 LAB — D-DIMER, QUANTITATIVE: D-Dimer, Quant: 414 ng/mL DDU — ABNORMAL HIGH (ref 0–229)

## 2017-05-05 LAB — APTT: aPTT: 23.4 s — ABNORMAL LOW (ref 24.1–34.9)

## 2017-05-05 MED ORDER — VENLAFAXINE HCL ER 150 MG PO CP24
150 MG | Freq: Every day | ORAL | Status: DC
Start: 2017-05-05 — End: 2017-05-07
  Administered 2017-05-05 – 2017-05-07 (×3): 150 mg via ORAL

## 2017-05-05 MED ORDER — NORMAL SALINE FLUSH 0.9 % IV SOLN
0.9 % | INTRAVENOUS | Status: DC | PRN
Start: 2017-05-05 — End: 2017-05-07
  Administered 2017-05-07: 17:00:00 10 mL via INTRAVENOUS

## 2017-05-05 MED ORDER — IOPAMIDOL 76 % IV SOLN
76 % | Freq: Once | INTRAVENOUS | Status: DC | PRN
Start: 2017-05-05 — End: 2017-05-07

## 2017-05-05 MED ORDER — OMEPRAZOLE 10 MG PO CPDR
10 MG | Freq: Every morning | ORAL | Status: DC
Start: 2017-05-05 — End: 2017-05-05

## 2017-05-05 MED ORDER — TECHNETIUM TC 99M TETROFOSMIN IV KIT
Freq: Once | INTRAVENOUS | Status: AC | PRN
Start: 2017-05-05 — End: 2017-05-05
  Administered 2017-05-05: 18:00:00 35 via INTRAVENOUS

## 2017-05-05 MED ORDER — ENOXAPARIN SODIUM 40 MG/0.4ML SC SOLN
40 MG/0.4ML | Freq: Every day | SUBCUTANEOUS | Status: DC
Start: 2017-05-05 — End: 2017-05-06

## 2017-05-05 MED ORDER — NITROGLYCERIN 2 % TD OINT
2 % | Freq: Once | TRANSDERMAL | Status: AC
Start: 2017-05-05 — End: 2017-05-05
  Administered 2017-05-05: 09:00:00 1 [in_us] via TOPICAL

## 2017-05-05 MED ORDER — MONTELUKAST SODIUM 10 MG PO TABS
10 MG | Freq: Every evening | ORAL | Status: DC
Start: 2017-05-05 — End: 2017-05-07
  Administered 2017-05-06 – 2017-05-07 (×2): 10 mg via ORAL

## 2017-05-05 MED ORDER — SPIRONOLACTONE 25 MG PO TABS
25 MG | Freq: Every day | ORAL | Status: DC
Start: 2017-05-05 — End: 2017-05-07
  Administered 2017-05-05 – 2017-05-06 (×2): 37.5 mg via ORAL

## 2017-05-05 MED ORDER — ONDANSETRON HCL 4 MG/2ML IJ SOLN
4 MG/2ML | Freq: Once | INTRAMUSCULAR | Status: AC
Start: 2017-05-05 — End: 2017-05-05
  Administered 2017-05-05: 07:00:00 4 mg via INTRAVENOUS

## 2017-05-05 MED ORDER — RANOLAZINE ER 500 MG PO TB12
500 MG | Freq: Two times a day (BID) | ORAL | Status: DC
Start: 2017-05-05 — End: 2017-05-07
  Administered 2017-05-05 – 2017-05-07 (×5): 500 mg via ORAL

## 2017-05-05 MED ORDER — TORSEMIDE 20 MG PO TABS
20 MG | Freq: Every day | ORAL | Status: DC
Start: 2017-05-05 — End: 2017-05-07
  Administered 2017-05-05 – 2017-05-06 (×2): 20 mg via ORAL

## 2017-05-05 MED ORDER — TRAZODONE HCL 50 MG PO TABS
50 MG | Freq: Every evening | ORAL | Status: DC
Start: 2017-05-05 — End: 2017-05-07
  Administered 2017-05-06 – 2017-05-07 (×2): 100 mg via ORAL

## 2017-05-05 MED ORDER — NITROGLYCERIN 0.4 MG SL SUBL
0.4 MG | SUBLINGUAL | Status: DC | PRN
Start: 2017-05-05 — End: 2017-05-07

## 2017-05-05 MED ORDER — HYDROMORPHONE 0.5MG/0.5ML IJ SOLN
1 MG/ML | Freq: Once | Status: AC
Start: 2017-05-05 — End: 2017-05-05
  Administered 2017-05-05: 09:00:00 0.5 mg via INTRAVENOUS

## 2017-05-05 MED ORDER — PANTOPRAZOLE SODIUM 40 MG PO TBEC
40 MG | Freq: Every day | ORAL | Status: DC
Start: 2017-05-05 — End: 2017-05-07
  Administered 2017-05-05 – 2017-05-06 (×2): 40 mg via ORAL

## 2017-05-05 MED ORDER — ONDANSETRON HCL 4 MG/2ML IJ SOLN
4 MG/2ML | Freq: Four times a day (QID) | INTRAMUSCULAR | Status: DC | PRN
Start: 2017-05-05 — End: 2017-05-07

## 2017-05-05 MED ORDER — PRASUGREL HCL 10 MG PO TABS
10 MG | Freq: Every day | ORAL | Status: DC
Start: 2017-05-05 — End: 2017-05-07
  Administered 2017-05-05 – 2017-05-07 (×3): 10 mg via ORAL

## 2017-05-05 MED ORDER — NORMAL SALINE FLUSH 0.9 % IV SOLN
0.9 % | Freq: Two times a day (BID) | INTRAVENOUS | Status: DC
Start: 2017-05-05 — End: 2017-05-07
  Administered 2017-05-06 – 2017-05-07 (×4): 10 mL via INTRAVENOUS

## 2017-05-05 MED ORDER — SODIUM CHLORIDE 0.9 % IV SOLN
0.9 % | INTRAVENOUS | Status: DC
Start: 2017-05-05 — End: 2017-05-05

## 2017-05-05 MED ORDER — HYDROMORPHONE HCL 2 MG/ML IJ SOLN
2 MG/ML | Freq: Once | INTRAMUSCULAR | Status: AC
Start: 2017-05-05 — End: 2017-05-05
  Administered 2017-05-05: 12:00:00 1 mg via INTRAVENOUS

## 2017-05-05 MED ORDER — CETIRIZINE HCL 10 MG PO TABS
10 MG | Freq: Every day | ORAL | Status: DC
Start: 2017-05-05 — End: 2017-05-07
  Administered 2017-05-05 – 2017-05-07 (×3): 10 mg via ORAL

## 2017-05-05 MED ORDER — MAGNESIUM HYDROXIDE 400 MG/5ML PO SUSP
400 MG/5ML | Freq: Every day | ORAL | Status: DC | PRN
Start: 2017-05-05 — End: 2017-05-07

## 2017-05-05 MED ORDER — HYDROMORPHONE 0.5MG/0.5ML IJ SOLN
1 MG/ML | Freq: Once | Status: AC
Start: 2017-05-05 — End: 2017-05-05
  Administered 2017-05-05: 07:00:00 0.5 mg via INTRAVENOUS

## 2017-05-05 MED ORDER — OXYCODONE-ACETAMINOPHEN 5-325 MG PO TABS
5-325 MG | Freq: Four times a day (QID) | ORAL | Status: DC | PRN
Start: 2017-05-05 — End: 2017-05-07
  Administered 2017-05-05 – 2017-05-06 (×4): 1 via ORAL

## 2017-05-05 MED ORDER — ASPIRIN 81 MG PO TBEC
81 MG | Freq: Every day | ORAL | Status: DC
Start: 2017-05-05 — End: 2017-05-07
  Administered 2017-05-05 – 2017-05-07 (×3): 81 mg via ORAL

## 2017-05-05 MED ORDER — ALBUTEROL SULFATE HFA 108 (90 BASE) MCG/ACT IN AERS
108 (90 Base) MCG/ACT | Freq: Four times a day (QID) | RESPIRATORY_TRACT | Status: DC | PRN
Start: 2017-05-05 — End: 2017-05-07

## 2017-05-05 MED ORDER — LISINOPRIL 20 MG PO TABS
20 MG | Freq: Every day | ORAL | Status: DC
Start: 2017-05-05 — End: 2017-05-07
  Administered 2017-05-05 – 2017-05-07 (×3): 20 mg via ORAL

## 2017-05-05 MED FILL — CETIRIZINE HCL 10 MG PO TABS: 10 MG | ORAL | Qty: 1

## 2017-05-05 MED FILL — EFFEXOR XR 150 MG PO CP24: 150 MG | ORAL | Qty: 1

## 2017-05-05 MED FILL — TORSEMIDE 20 MG PO TABS: 20 MG | ORAL | Qty: 1

## 2017-05-05 MED FILL — PANTOPRAZOLE SODIUM 40 MG PO TBEC: 40 MG | ORAL | Qty: 1

## 2017-05-05 MED FILL — DILAUDID 2 MG/ML IJ SOLN: 2 MG/ML | INTRAMUSCULAR | Qty: 1

## 2017-05-05 MED FILL — NITROSTAT 0.4 MG SL SUBL: 0.4 MG | SUBLINGUAL | Qty: 25

## 2017-05-05 MED FILL — OXYCODONE-ACETAMINOPHEN 5-325 MG PO TABS: 5-325 MG | ORAL | Qty: 1

## 2017-05-05 MED FILL — PROAIR HFA 108 (90 BASE) MCG/ACT IN AERS: 108 (90 Base) MCG/ACT | RESPIRATORY_TRACT | Qty: 8.5

## 2017-05-05 MED FILL — ASPIRIN EC 81 MG PO TBEC: 81 MG | ORAL | Qty: 1

## 2017-05-05 MED FILL — EFFIENT 10 MG PO TABS: 10 MG | ORAL | Qty: 1

## 2017-05-05 MED FILL — SPIRONOLACTONE 25 MG PO TABS: 25 MG | ORAL | Qty: 2

## 2017-05-05 MED FILL — HYDROMORPHONE HCL 1 MG/ML IJ SOLN: 1 MG/ML | INTRAMUSCULAR | Qty: 0.5

## 2017-05-05 MED FILL — NITRO-BID 2 % TD OINT: 2 % | TRANSDERMAL | Qty: 1

## 2017-05-05 MED FILL — SODIUM CHLORIDE FLUSH 0.9 % IV SOLN: 0.9 % | INTRAVENOUS | Qty: 10

## 2017-05-05 MED FILL — LISINOPRIL 20 MG PO TABS: 20 MG | ORAL | Qty: 1

## 2017-05-05 MED FILL — RANEXA 500 MG PO TB12: 500 MG | ORAL | Qty: 1

## 2017-05-05 MED FILL — ONDANSETRON HCL 4 MG/2ML IJ SOLN: 4 MG/2ML | INTRAMUSCULAR | Qty: 2

## 2017-05-05 NOTE — Progress Notes (Signed)
Advanced Care Planning Note.    Purpose of Encounter: Advanced care planning in light of CAD  Parties In Attendance: Patient  Decisional Capacity: Yes  Subjective: Patient complaining of L CP  Objective: Trop < 0.01  Goals of Care Determination: Patient wants full support (CPR, vent, surgery, HD, trach, PEG)  Plan:  SPECT stress.  Code Status: Full code   Time spent on Advanced care Planning: 20 minutes    Catalina Gravel, MD  05/05/2017 7:58 AM

## 2017-05-05 NOTE — Progress Notes (Signed)
Patient to finish rest imaging for 2 day protocol stress test in the morning. No caffeine restriction, NPO after midnight.

## 2017-05-05 NOTE — ED Notes (Signed)
Patient placed on 2L O2 due to sats dropping down to 88-89%     Knute Neu, RN  05/05/17 [phone]

## 2017-05-05 NOTE — ED Notes (Signed)
Bed: 23-01  Expected date:   Expected time:   Means of arrival: Personal Transport  Comments:     Alex Gardener, RN  05/05/17 [phone]

## 2017-05-05 NOTE — Progress Notes (Signed)
Patient admitted to room 3323- VSS. A&O. Admission completed. Updated on plan of care, denies needs. Call light within reach. Will continue to monitor.

## 2017-05-05 NOTE — H&P (Signed)
HOSPITALISTS HISTORY AND PHYSICAL    05/05/2017 7:58 AM    Patient Information:  Rachael Mays is a 61 y.o. female [id number]  PCP:  Acquanetta Sit SLOUGH, MD (Tel: [phone] )    Chief complaint:    Chief Complaint   Patient presents with   . Chest Pain     and SOB x1 hour. 2 325mg  asa and 1 nitro with relief but now coming back. left sided into left arm.        History of Present Illness:  Rachael Mays is a 61 y.o. female with history of morbid obesity, CAD s/p LAD stent with chronic angina on Ranexa, OSA on BiPap, remote history of PE/DVT s/p multiple negative CTA Chest in past, chronic D CHF on Demadex, GERD, HTN, hyperlipidemia, anxiety, depression who came to ER with acute onset of L sided CP.  CP began last night around 10 PM.  Radiating to L arm.  Associated with SOB.  No hemoptysis, cough, pleurisy, fevers, chills, NS, diarrhea, melena or hematochezia.  Denies GERD, dysphagia or odynophagia.  Last stress was over a year ago.  No change in functional capacity.  No orthopnea or PND.  Otherwise complete ROS is negative unless listed above.    REVIEW OF SYSTEMS:   Pertinent positives as noted in HPI.  All other systems were reviewed and are negative.    Past Medical History:   has a past medical history of Anxiety; Asthma; Blood circulation, collateral; CAD (coronary artery disease); CHF (congestive heart failure) ([place]); Chronic back pain; Deep vein thrombosis; Depression; GERD (gastroesophageal reflux disease); GERD (gastroesophageal reflux disease); Hx of blood clots; Hyperlipidemia; Hypertension; Idiopathic peripheral neuropathy; Obesity; Obstructive sleep apnea (adult) (pediatric); and Pulmonary embolism ([place]).     Past Surgical History:   has a past surgical history that includes Colonoscopy; Cesarean section; Cholecystectomy; Hysterectomy; shoulder surgery; Lap Band (05/01/08); back surgery; Colonoscopy (04/08/10); other surgical history;  Upper gastrointestinal endoscopy (05/19/13); other surgical history (07/05/13); Upper gastrointestinal endoscopy (07/21/13); partial hysterectomy (cervix not removed); Upper gastrointestinal endoscopy (N/A, 07/31/2014); vascular surgery (04/2015); Diagnostic Cardiac Cath Lab Procedure; Coronary angioplasty; Endoscopy, colon, diagnostic; Breast surgery; and Dilatation, esophagus.     Medications:  No current facility-administered medications on file prior to encounter.      Current Outpatient Prescriptions on File Prior to Encounter   Medication Sig Dispense Refill   . omeprazole (PRILOSEC) 40 MG delayed release capsule TAKE ONE CAPSULE BY MOUTH ONE TIME A DAY 90 capsule 0   . ranolazine (RANEXA) 500 MG extended release tablet Take 1 tablet by mouth 2 times daily 60 tablet 3   . metoprolol (LOPRESSOR) 100 MG tablet TAKE 1 AND 1/2 TABLET BY MOUTH TWO TIMES A DAY 270 tablet 1   . lisinopril (PRINIVIL;ZESTRIL) 40 MG tablet TAKE ONE TABLET BY MOUTH ONE TIME A DAY 90 tablet 1   . prasugrel (EFFIENT) 10 MG TABS TAKE 1 TABLET BY MOUTH DAILY 90 tablet 3   . aspirin 81 MG EC tablet Take 1 tablet by mouth daily 30 tablet 11   . fluocinonide (LIDEX) 0.05 % external solution APPLY SPARINGLY TO THE BACK OF THE SCALP DAILY IF NEEDED FOR ITCHING 60 mL 1   . montelukast (SINGULAIR) 10 MG tablet TAKE ONE TABLET BY MOUTH ONE TIME A DAY 90 tablet 1   . spironolactone (ALDACTONE) 25 MG tablet Take 1.5 tablets by mouth daily 90 tablet 3   . traZODone (DESYREL) 100 MG tablet TAKE ONE TABLET BY MOUTH  NIGHTLY 90 tablet 3   . venlafaxine (EFFEXOR XR) 150 MG extended release capsule Take 1 capsule by mouth daily 90 capsule 3   . nitroGLYCERIN (NITROSTAT) 0.4 MG SL tablet up to max of 3 total doses. If no relief after 1 dose, call 911. 25 tablet 3   . torsemide (DEMADEX) 20 MG tablet Take 2 tablets by mouth daily (Patient taking differently: Take 20 mg by mouth daily Takes an extra dose for a wt gain > 3#) 180 tablet 3   . triamcinolone (KENALOG)  0.1 % cream Apply topically 2 times daily.Apply with Q tip in each ear BID for 1 week. 15 g 1   . mupirocin (BACTROBAN) 2 % ointment Apply topically  Daily.Apply with Q tip in each naris at night 15 g 0   . oxyCODONE-acetaminophen (PERCOCET) 5-325 MG per tablet Take 1 tablet by mouth every 6 hours as needed for Pain . 40 tablet 0   . albuterol sulfate HFA (PROVENTIL HFA) 108 (90 BASE) MCG/ACT inhaler Inhale 2 puffs into the lungs every 6 hours as needed for Wheezing 3 Inhaler 3   . Multiple Vitamins-Minerals (MULTIVITAMIN PO) Take 1 tablet by mouth daily      . CALCIUM-VITAMIN D PO Take 1 tablet by mouth daily      . cetirizine (ZYRTEC ALLERGY) 10 MG tablet Take 10 mg by mouth daily.         Allergies:  Allergies   Allergen Reactions   . Morphine Itching   . Seasonal    . Talwin [Pentazocine] Other (See Comments)     dizzy        Social History:   reports that she quit smoking about 4 years ago. Her smoking use included Cigarettes. She has a 20.00 pack-year smoking history. She quit smokeless tobacco use about 3 years ago. She reports that she does not drink alcohol or use drugs.     Family History:  family history includes Arthritis in her mother; Cancer in her mother; Cancer (age of onset: 80) in her father; Depression in her mother; Diabetes in an other family member; Early Death in her paternal grandmother; Heart Disease in her maternal grandmother and paternal grandfather; Heart Disease (age of onset: 83) in her mother; High Blood Pressure in her mother and another family member; Obesity in an other family member; Other in her brother, brother, daughter, and sister; Ovarian Cancer (age of onset: 74) in her mother.     Physical Exam:  BP (!) 123/57   Pulse 93   Temp 98.8 F (37.1 C)   Resp 15   SpO2 95%     General appearance:  Appears comfortable, morbidly obese, pleasant  Eyes: Sclera injected, pupils equal  ENT: Moist mucus membranes, no thrush. Trachea midline.  Cardiovascular: Regular rhythm, normal  S1, S2. No murmur, gallop, rub. No edema in lower extremities  Respiratory: Clear to auscultation bilaterally, no wheeze, good inspiratory effort  Gastrointestinal: Abdomen soft, obese, non-tender, not distended, normal bowel sounds  Musculoskeletal: No cyanosis in digits, neck supple.  Chest pain not reproducible on exam.  Neurology: Grossly intact. Alert and oriented in time, place and person. No speech or motor deficits  Psychiatry: Anxious affect. Not agitated  Skin: Warm, dry, normal turgor, no rash    Labs:  CBC:   Lab Results   Component Value Date    WBC 7.4 05/05/2017    RBC 4.43 05/05/2017    HGB 13.1 05/05/2017    HCT 38.9 05/05/2017  MCV 87.6 05/05/2017    MCH 29.6 05/05/2017    MCHC 33.8 05/05/2017    RDW 13.4 05/05/2017    PLT 179 05/05/2017    MPV 8.6 05/05/2017     BMP:    Lab Results   Component Value Date    NA 141 05/05/2017    K 3.9 05/05/2017    CL 103 05/05/2017    CO2 25 05/05/2017    BUN 23 05/05/2017    CREATININE 1.2 05/05/2017    CALCIUM 9.8 05/05/2017    GFRAA 56 05/05/2017    GFRAA >60 03/16/2012    LABGLOM 46 05/05/2017    GLUCOSE 150 05/05/2017         Problem List  Principal Problem:    Chest pain in adult  Active Problems:    Chronic diastolic congestive heart failure (HCC)    Coronary artery disease due to lipid rich plaque    Pulmonary hypertension    Status post insertion of drug-eluting stent into left anterior descending (LAD) artery    Dyslipidemia    Pulmonary embolism (HCC)    Nausea & vomiting    Morbid obesity with BMI of 40.0-44.9, adult ([place])    Status post gastric banding    Dizziness    Other complications of gastric band procedure    Essential hypertension    Obstructive sleep apnea    Anxiety and depression    Idiopathic peripheral neuropathy    Hypoxia    Chronic pain syndrome  Resolved Problems:    * No resolved hospital problems. *        Assessment/Plan:     61 yo F with CP in setting of known CAD s/p LAD stent, OSA, h/o PE/DVT    1. Repeat troponin  2. If  troponin negative, SPECT stress today per Dr Susy Manor (I discussed with him on phone, he agrees with plan of care and recommends against CTA Chest (D dimer elevated to 300-400s in past with multiple negative CTA Chest)  3. NPO  4. IVF  5. Dilaudid IV PRN pain  6. Nitropaste placed in ED  7. Cont home cardiac meds including Ranexa  8. Lovenox for DVT ppx   9. Full code    Place in Observation. I anticipate hospitalization spanning less than two midnights for investigation and treatment of the above medically necessary diagnoses.    Discussed with patient and Dr Susy Manor (Cardio).    Home if work up negative.    Catalina Gravel, MD    05/05/2017 7:58 AM

## 2017-05-05 NOTE — Progress Notes (Signed)
Patient instructed on Bruce Protocol Stress Test Procedure including possible side effects and adverse reactions.  Verbalizes knowledge and understanding and denies having any questions. Lotoya Casella RN

## 2017-05-05 NOTE — Progress Notes (Signed)
RESPIRATORY THERAPY ASSESSMENT    Name:  [place] Record Number:  [id number]  Age: 61 y.o.   Gender: female  DOB: 07-13-1956  Today's Date:  05/05/2017  Room:  3AN-3323/3323-01    Assessment   Patient Admission Diagnosis      Allergies  Allergies   Allergen Reactions   . Morphine Itching   . Seasonal    . Talwin [Pentazocine] Other (See Comments)     dizzy       Minimum Predicted Vital Capacity:     0          Actual Vital Capacity:      0          Pulmonary History: Asthma, Pulmonary Hypertension and CHF/Pulmonary Edema   Home Oxygen Therapy:  room air  Home Respiratory Therapy: Albuterol  Current Respiratory Therapy:  alb mdi prn          Respiratory Severity Index(RSI)   Patients with orders for inhalation medications, oxygen, or any therapeutic treatment modality will be placed on Respiratory Protocol.  They will be assessed with the first treatment and at least every 72 hours thereafter.  The following severity scale will be used to determine frequency of treatment intervention.    Smoking History: Pulmonary Disease or Smoking History, Greater than 15 pack year = 2    Social History  Social History   Substance Use Topics   . Smoking status: Former Smoker     Packs/day: 0.50     Years: 40.00     Types: Cigarettes     Quit date: 08/07/2012   . Smokeless tobacco: Former Systems developer     Quit date: 06/16/2013      Comment: started to smoke at age 79 / only smoked 0.5 p.p.d    . Alcohol use No       Recent Surgical History: None = 0  Past Surgical History  Past Surgical History:   Procedure Laterality Date   . BACK SURGERY      neck  plates   . BREAST SURGERY      left lumpectomy   . CESAREAN SECTION     . CHOLECYSTECTOMY     . COLONOSCOPY     . COLONOSCOPY  04/08/10   . CORONARY ANGIOPLASTY     . DIAGNOSTIC CARDIAC CATH LAB PROCEDURE     . DILATATION, ESOPHAGUS     . ENDOSCOPY, COLON, DIAGNOSTIC     . HYSTERECTOMY     . LAP BAND  05/01/08    Dr. Nicole Cella   . OTHER SURGICAL HISTORY      [place] Filter: curently in  place   . OTHER SURGICAL HISTORY  07/05/13    lap band removal   . PARTIAL HYSTERECTOMY     . SHOULDER SURGERY     . UPPER GASTROINTESTINAL ENDOSCOPY  05/19/13   . UPPER GASTROINTESTINAL ENDOSCOPY  07/21/13    ESOPHAGEAL STENT PLACEMENT   . UPPER GASTROINTESTINAL ENDOSCOPY N/A 07/31/2014    Esophagogastroduodenoscopy with esophageal balloon dilation   . VASCULAR SURGERY  04/2015    Lucendia Herrlich, celiac artery angiogram, normal abd arteries       Level of Consciousness: Alert, Oriented, and Cooperative = 0    Level of Activity: Mostly sedentary, minimal walking = 2    Respiratory Pattern: Regular Pattern; RR 8-20 = 0    Breath Sounds: Clear = 0    Sputum   ,  ,    Cough: Strong, spontaneous,  non-productive = 0    Vital Signs   BP 115/64   Pulse 91   Temp 97.4 F (36.3 C) (Temporal)   Resp 16   Ht 5' 6" (1.676 m)   Wt 290 lb (131.5 kg)   SpO2 92%   BMI 46.81 kg/m   SPO2 (COPD values may differ): 90-91% on room air or greater than 92% on FiO2 24- 28% = 1    Peak Flow (asthma only): not applicable = 0    RSI: 5-6 = Q4hr PRN (every four hours as needed) for dyspnea        Plan       Goals: medication delivery  Plan of Care: per rsi prn    Patient/caregiver was educated on the proper method of use of Respiratory Therapy device:  Yes      Level of understanding, patient was able to:(check appropriate box(es))   [x] Verbalize understanding   [] Demonstrate understanding       [] Teach back        [] Needs reinforcement       []  No available caregiver               []  Other:     Response to education:  Good     Teaching Time:  10  minutes      Is patient being placed on Home Treatment Regimen?  Yes     Electronically signed by Dionisio Paschal, RCP on 05/05/2017 at 1:43 PM    Respiratory Protocol Guidelines     1. Assessment and treatment by Respiratory Therapy will be initiated for medication and therapeutic interventions upon initiation of aerosolized medication.  2. Physician will be contacted for respiratory rate (RR)  greater than 35 breaths per minute. Therapy will be held for heart rate (HR) greater than 140 beats per minute, pending direction from physician.  3. Bronchodilators will be administered via Metered Dose Inhaler (MDI) with spacer when the following criteria are met:  a. Alert and cooperative     b. HR < 140 bpm  c. RR < 30 bpm                d. Can demonstrate a 2-3 second inspiratory hold  4. Bronchodilators will be administered via Hand Held Nebulizer [facility]) to patients when ANY of the following criteria are met  a. Incognizant or uncooperative          b. Patients treated with HHN at Home        c. Unable to demonstrate proper MDI or HFA technique     d. RR > 30 bpm   5. Bronchodilators will be delivered via Metered Dose Inhaler (MDI) or Hydrofluoroalkane (HFA) with spacer to intubated patients on mechanical ventilation.  6. Inhalation medication orders will be delivered and/or substituted as outlined below.    Aerosolized Medications Ordering and Administration Guidelines:    1. All Medications will be ordered by a physician, and their frequency and/or modality will be adjusted as defined by the patients Respiratory Severity Index (RSI) score.  2. If the patient does not have documented COPD, consider discontinuing anticholinergics when RSI is less than 9.  3. If the bronchospasm worsens (increased RSI), then the bronchodilator frequency can be increased to a maximum of every 4 hours.  If greater than every 4 hours is required, the physician will be contacted.  4. If the bronchospasm improves, the frequency of the bronchodilator can be decreased, based  on the patient's RSI, but not less than home treatment regimen frequency.  5. Bronchodilator(s) will be discontinued if patient has a RSI less than 9 and has received no scheduled or as needed treatment for 72  Hrs.    Patients Ordered on a Mucolytic Agent:    1. Must always be administered with a bronchodilator.    2. Discontinue if patient experiences worsened  bronchospasm, or secretions have lessened to the point that the patient is able to clear them with a cough.    Anti-inflammatory and Combination Medications:    1. If the patient lacks prior history of lung disease, is not using inhaled anti-inflammatory medication at home, and lacks wheezing by examination or by history for at least 24 hours, contact physician for possible discontinuation.

## 2017-05-05 NOTE — Progress Notes (Incomplete)
Chest pain originating in chest then radiating to left arm 7/10.  NTG SL x1

## 2017-05-05 NOTE — Care Coordination-Inpatient (Signed)
[place] Transitions Interview     05/05/2017    Patient: Rachael Mays Patient DOB: 07/17/1956   MRN: [id number]  Reason for Admission: CP  RARS: Readmission Risk Score: 14     Pt out of room, will attempt to revisit    Follow Up  Future Appointments  Date Time Provider [place]   06/01/2017 3:00 PM Luiz Iron, APRN - CNP FF Cardio [facility]   02/07/2018 2:00 PM Tiffanie Rita Ohara, APRN - CNP [facility] SLEEP [facility]       Health Maintenance  Health Maintenance Due   Topic Date Due   . Breast cancer screen  10/23/2016       Iver Nestle, RN

## 2017-05-05 NOTE — ED Notes (Signed)
Hospital MD bedside to evaluate pt and update on plan of care.      Elmer Bales, RN  05/05/17 [phone]

## 2017-05-05 NOTE — Care Coordination-Inpatient (Signed)
SW reviewed pt's electronic medical chart and discussed pt with bedside RN during morning huddles.     Pt is in OBSERVATION status for chest pain. Pt will go for stress test today. Per bedside RN, pt not likely to have any discharge needs.     Sharol Roussel MSW, LSW  [phone]

## 2017-05-06 LAB — BASIC METABOLIC PANEL
Anion Gap: 15 (ref 3–16)
BUN: 20 mg/dL (ref 7–20)
CO2: 24 mmol/L (ref 21–32)
Calcium: 8.9 mg/dL (ref 8.3–10.6)
Chloride: 102 mmol/L (ref 99–110)
Creatinine: 1.1 mg/dL (ref 0.6–1.1)
GFR African American: >60 (ref >60)
GFR Non-African American: 51 — AB (ref >60)
Glucose: 99 mg/dL (ref 70–99)
Potassium: 5 mmol/L (ref 3.5–5.1)
Sodium: 141 mmol/L (ref 136–145)

## 2017-05-06 LAB — APTT: aPTT: 30.6 s (ref 26.0–36.0)

## 2017-05-06 LAB — CBC WITH AUTO DIFFERENTIAL
Basophils %: 1 %
Basophils Absolute: 0.1 10*3/uL (ref 0.0–0.2)
Eosinophils %: 3 %
Eosinophils Absolute: 0.2 10*3/uL (ref 0.0–0.6)
Hematocrit: 40.1 % (ref 36.0–48.0)
Hemoglobin: 13.2 g/dL (ref 12.0–16.0)
Lymphocytes %: 35 %
Lymphocytes Absolute: 2.2 10*3/uL (ref 1.0–5.1)
MCH: 29.8 pg (ref 26.0–34.0)
MCHC: 32.8 g/dL (ref 31.0–36.0)
MCV: 90.9 fL (ref 80.0–100.0)
MPV: 9.9 fL (ref 5.0–10.5)
Monocytes %: 11 %
Monocytes Absolute: 0.7 10*3/uL (ref 0.0–1.3)
Neutrophils %: 50 %
Neutrophils Absolute: 3.2 10*3/uL (ref 1.7–7.7)
PLATELET SLIDE REVIEW: ADEQUATE
Platelets: 184 10*3/uL (ref 135–450)
RBC Morphology: NORMAL
RBC: 4.41 M/uL (ref 4.00–5.20)
RDW: 13.6 % (ref 12.4–15.4)
WBC: 6.3 10*3/uL (ref 4.0–11.0)

## 2017-05-06 MED ORDER — HEPARIN SOD (PORCINE) IN D5W 100 UNIT/ML IV SOLN
100 UNIT/ML | INTRAVENOUS | Status: DC
Start: 2017-05-06 — End: 2017-05-07
  Administered 2017-05-06: 21:00:00 7.5 [IU]/kg/h via INTRAVENOUS
  Administered 2017-05-07: 19:00:00 9.52 [IU]/kg/h via INTRAVENOUS

## 2017-05-06 MED ORDER — LACTULOSE 10 GM/15ML PO SOLN
10 GM/15ML | Freq: Every day | ORAL | Status: DC | PRN
Start: 2017-05-06 — End: 2017-05-07
  Administered 2017-05-06: 23:00:00 10 g via ORAL

## 2017-05-06 MED ORDER — TECHNETIUM TC 99M TETROFOSMIN IV KIT
Freq: Once | INTRAVENOUS | Status: AC | PRN
Start: 2017-05-06 — End: 2017-05-06
  Administered 2017-05-06: 18:00:00 35 via INTRAVENOUS

## 2017-05-06 MED ORDER — PILL SPLITTER MISC
Status: AC
Start: 2017-05-06 — End: 2017-05-07

## 2017-05-06 MED ORDER — HEPARIN SODIUM (PORCINE) 1000 UNIT/ML IJ SOLN
1000 UNIT/ML | INTRAMUSCULAR | Status: DC | PRN
Start: 2017-05-06 — End: 2017-05-07
  Administered 2017-05-07: 05:00:00 2000 [IU] via INTRAVENOUS

## 2017-05-06 MED ORDER — KETOROLAC TROMETHAMINE 0.5 % OP SOLN
0.5 % | Freq: Four times a day (QID) | OPHTHALMIC | Status: DC
Start: 2017-05-06 — End: 2017-05-07
  Administered 2017-05-07 (×4): 1 [drp] via OPHTHALMIC

## 2017-05-06 MED ORDER — HEPARIN SODIUM (PORCINE) 1000 UNIT/ML IJ SOLN
1000 UNIT/ML | Freq: Once | INTRAMUSCULAR | Status: DC
Start: 2017-05-06 — End: 2017-05-06

## 2017-05-06 MED ORDER — PREDNISOLONE ACETATE 1 % OP SUSP
1 % | Freq: Four times a day (QID) | OPHTHALMIC | Status: DC
Start: 2017-05-06 — End: 2017-05-07
  Administered 2017-05-07 (×4): 1 [drp] via OPHTHALMIC

## 2017-05-06 MED ORDER — HEPARIN SODIUM (PORCINE) 1000 UNIT/ML IJ SOLN
1000 UNIT/ML | Freq: Once | INTRAMUSCULAR | Status: AC
Start: 2017-05-06 — End: 2017-05-06
  Administered 2017-05-06: 21:00:00 4000 [IU] via INTRAVENOUS

## 2017-05-06 MED ORDER — MORPHINE SULFATE 2 MG/ML IJ SOLN
2 MG/ML | INTRAMUSCULAR | Status: DC | PRN
Start: 2017-05-06 — End: 2017-05-07
  Administered 2017-05-06 – 2017-05-07 (×5): 2 mg via INTRAVENOUS

## 2017-05-06 MED ORDER — HEPARIN SODIUM (PORCINE) 1000 UNIT/ML IJ SOLN
1000 UNIT/ML | INTRAMUSCULAR | Status: DC | PRN
Start: 2017-05-06 — End: 2017-05-07

## 2017-05-06 MED FILL — OXYCODONE-ACETAMINOPHEN 5-325 MG PO TABS: 5-325 MG | ORAL | Qty: 1

## 2017-05-06 MED FILL — LISINOPRIL 20 MG PO TABS: 20 MG | ORAL | Qty: 1

## 2017-05-06 MED FILL — CETIRIZINE HCL 10 MG PO TABS: 10 MG | ORAL | Qty: 1

## 2017-05-06 MED FILL — SODIUM CHLORIDE FLUSH 0.9 % IV SOLN: 0.9 % | INTRAVENOUS | Qty: 10

## 2017-05-06 MED FILL — SPIRONOLACTONE 25 MG PO TABS: 25 MG | ORAL | Qty: 2

## 2017-05-06 MED FILL — HEPARIN SODIUM (PORCINE) 1000 UNIT/ML IJ SOLN: 1000 UNIT/ML | INTRAMUSCULAR | Qty: 10

## 2017-05-06 MED FILL — RANEXA 500 MG PO TB12: 500 MG | ORAL | Qty: 1

## 2017-05-06 MED FILL — PANTOPRAZOLE SODIUM 40 MG PO TBEC: 40 MG | ORAL | Qty: 1

## 2017-05-06 MED FILL — EFFIENT 10 MG PO TABS: 10 MG | ORAL | Qty: 1

## 2017-05-06 MED FILL — MORPHINE SULFATE 2 MG/ML IJ SOLN: 2 mg/mL | INTRAMUSCULAR | Qty: 1

## 2017-05-06 MED FILL — TORSEMIDE 20 MG PO TABS: 20 MG | ORAL | Qty: 1

## 2017-05-06 MED FILL — PILL SPLITTER MISC: Qty: 1

## 2017-05-06 MED FILL — PRED FORTE 1 % OP SUSP: 1 % | OPHTHALMIC | Qty: 100

## 2017-05-06 MED FILL — TRAZODONE HCL 50 MG PO TABS: 50 MG | ORAL | Qty: 2

## 2017-05-06 MED FILL — EFFEXOR XR 150 MG PO CP24: 150 MG | ORAL | Qty: 1

## 2017-05-06 MED FILL — HEPARIN SOD (PORCINE) IN D5W 100 UNIT/ML IV SOLN: 100 UNIT/ML | INTRAVENOUS | Qty: 250

## 2017-05-06 MED FILL — KETOROLAC TROMETHAMINE 0.5 % OP SOLN: 0.5 % | OPHTHALMIC | Qty: 3

## 2017-05-06 MED FILL — SINGULAIR 10 MG PO TABS: 10 MG | ORAL | Qty: 1

## 2017-05-06 MED FILL — LACTULOSE 10 GM/15ML PO SOLN: 10 GM/15ML | ORAL | Qty: 30

## 2017-05-06 MED FILL — ASPIRIN EC 81 MG PO TBEC: 81 MG | ORAL | Qty: 1

## 2017-05-06 NOTE — Plan of Care (Signed)
Problem: Pain:  Goal: Pain level will decrease  Pain level will decrease   Outcome: Ongoing  Pt c/o constant chest pain. Pt medicated per order. Pt is sleeping.

## 2017-05-06 NOTE — Progress Notes (Signed)
[facility]PITALISTS PROGRESS NOTE    05/06/2017 3:41 PM        Name: Rachael Mays .              Admitted: 05/05/2017  Primary Care Provider: Acquanetta Sit SLOUGH, MD (Tel: [phone])      Subjective:  CC:  CP    Patient with ongoing CP.  No HA, SOB, abdominal pain or fevers.    Reviewed interval ancillary notes    Current Medications    prednisoLONE acetate (PRED FORTE) 1 % ophthalmic suspension 1 drop 4 times per day   ketorolac (ACULAR) 0.5 % ophthalmic solution 1 drop 4x Daily   heparin (porcine) injection 8,000 Units Once   heparin (porcine) injection 8,000 Units PRN   heparin (porcine) injection 4,000 Units PRN   heparin 25,000 units in dextrose 5% 250 mL infusion Continuous   nitroGLYCERIN (NITROSTAT) SL tablet 0.4 mg Q5 Min PRN   iopamidol (ISOVUE-370) 76 % injection 90 mL ONCE PRN   albuterol sulfate HFA 108 (90 Base) MCG/ACT inhaler 2 puff Q6H PRN   aspirin EC tablet 81 mg Daily   cetirizine (ZYRTEC) tablet 10 mg Daily   lisinopril (PRINIVIL;ZESTRIL) tablet 20 mg Daily   montelukast (SINGULAIR) tablet 10 mg Nightly   oxyCODONE-acetaminophen (PERCOCET) 5-325 MG per tablet 1 tablet Q6H PRN   prasugrel (EFFIENT) tablet 10 mg Daily   ranolazine (RANEXA) extended release tablet 500 mg BID   spironolactone (ALDACTONE) tablet 37.5 mg Daily   torsemide (DEMADEX) tablet 20 mg Daily   traZODone (DESYREL) tablet 100 mg Nightly   venlafaxine (EFFEXOR XR) extended release capsule 150 mg Daily   sodium chloride flush 0.9 % injection 10 mL 2 times per day   sodium chloride flush 0.9 % injection 10 mL PRN   magnesium hydroxide (MILK OF MAGNESIA) 400 MG/5ML suspension 30 mL Daily PRN   ondansetron (ZOFRAN) injection 4 mg Q6H PRN   pantoprazole (PROTONIX) tablet 40 mg QAM AC       Objective:  BP 123/65   Pulse 70   Temp 96.8 F (36 C) (Temporal)   Resp 18   Ht 5\' 6"  (1.676 m)   Wt 294 lb 1.6 oz (133.4 kg)   SpO2 96%   BMI 47.47 kg/m    No intake or output data in the 24 hours ending 05/06/17 1541 Wt Readings from Last 3 Encounters:   05/06/17 294 lb 1.6 oz (133.4 kg)   04/29/17 295 lb (133.8 kg)   04/09/17 294 lb (133.4 kg)       General appearance:  Appears comfortable, morbidly obese, pleasant  Eyes: Sclera injected, pupils equal  ENT: Moist mucus membranes, no thrush. Trachea midline.  Cardiovascular: Regular rhythm, normal S1, S2. No murmur, gallop, rub. No edema in lower extremities  Respiratory: Clear to auscultation bilaterally, no wheeze, good inspiratory effort  Gastrointestinal: Abdomen soft, obese, non-tender, not distended, normal bowel sounds  Musculoskeletal: No cyanosis in digits, neck supple.  Chest pain not reproducible on exam.  Neurology: Grossly intact. Alert and oriented in time, place and person. No speech or motor deficits  Psychiatry: Anxious affect. Not agitated  Skin: Warm, dry, normal turgor, no rash    Labs and Tests:  CBC:   Recent Labs      05/05/17   0321  05/06/17   0542   WBC  7.4  6.3   HGB  13.1  13.2   PLT  179  184  BMP:  Recent Labs      05/05/17   0321  05/06/17   0542   NA  141  141   K  3.9  5.0   CL  103  102   CO2  25  24   BUN  23*  20   CREATININE  1.2*  1.1   GLUCOSE  150*  99     Hepatic: Recent Labs      05/05/17   0321   AST  28   ALT  41*   BILITOT  <0.2   ALKPHOS  89       Problem List  Principal Problem:    Unstable angina (HCC)  Active Problems:    Chronic diastolic congestive heart failure (HCC)    Coronary artery disease due to lipid rich plaque    Pulmonary hypertension    Status post insertion of drug-eluting stent into left anterior descending (LAD) artery    Dyslipidemia    Pulmonary embolism (HCC)    Nausea & vomiting    Morbid obesity with BMI of 40.0-44.9, adult (HCC)    Status post gastric banding    Dizziness    Other complications of gastric band procedure    Essential hypertension    Obstructive sleep apnea    Anxiety and depression    Idiopathic peripheral neuropathy    Chest  pain in adult    Hypoxia    Chronic pain syndrome  Resolved Problems:    * No resolved hospital problems. *       Assessment & Plan:   1. SPECT is positive  2. Change to inpatient status  3. Cardiology consult for unstable angina  4. Hep gtt  5. NPO p MN for LHC  6. DC Lovneox  7. Morphine IV PRN pain      Diet: Diet NPO, After Midnight  DIET CARDIAC;  Code:Full Code  DVT PPX  Hep gtt    Discussed with patient, Dr Susy Manor (Cardio), nursing and CM.    Will monitor closely.    LHC in AM.    Critical care time was 35 minutes excluding procedures.      Catalina Gravel, MD   05/06/2017 3:41 PM

## 2017-05-06 NOTE — Progress Notes (Signed)
Stress test positive, dr. Posey Pronto and cardiology nurse aware.

## 2017-05-06 NOTE — Progress Notes (Signed)
VSS. Shift assessment completed. Pt A&O x4. POC reviewed, all questions answered. Pt c/o constant chest pain, medicated with percocet per order. Pt denies any further needs. Call light in reach.

## 2017-05-06 NOTE — Progress Notes (Signed)
Pt gone down for stress test via wc, pt npo.

## 2017-05-06 NOTE — Care Coordination-Inpatient (Signed)
[place] Transitions Interview     05/06/2017    Patient: Rachael Mays Patient DOB: [birth year]   MRN: [id number]  Reason for Admission: chest pain  RARS: Readmission Risk Score: 14       [name]      Readmission Risk  Patient Active Problem List   Diagnosis   . Back pain   . Deep vein thrombosis ([place])   . Depression   . Pulmonary embolism ([place])   . Nausea & vomiting   . Abdominal  pain, other specified site   . Morbid obesity with BMI of 40.0-44.9, adult ([place])   . Status post gastric banding   . Vertigo   . Dizziness   . Tinnitus, subjective   . Other complications of gastric band procedure   . Essential hypertension   . Hyperglycemia   . Atypical chest pain   . 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 Adrenal mass, left ([place])   . Adrenal nodule ([place])   . Obstructive sleep apnea   . Dyspnea and respiratory abnormalities   . Abnormal stress test   . Shortness of breath   . Chronic diastolic congestive heart failure ([place])   . Coronary artery disease due to lipid rich plaque   . Pulmonary hypertension   . 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 Chest pain   . Acute actinic otitis externa of both ears   . Anxiety and depression   . Bradycardia   . Dyslipidemia   . Chronic eczematous otitis externa of both ears   . Mucositis   . Sprain of radiocarpal joint of right wrist   . Idiopathic peripheral neuropathy   . Chest pain in adult   . Hypoxia   . Chronic pain syndrome       Inpatient Assessment  Care Transitions Summary    Care Transitions Inpatient Review  Medication Review  Do you have all of your prescriptions and are they filled?:  Yes   Are you able to afford your medications?:  Yes  How often do you have difficulty taking your medications?:  I always take them as prescribed.  Housing Review  Who do you live with?:  Partner/Spouse/SO  Are you an active caregiver in your home?:  No  Social Support  Do you have a Case Worker?:  No  Do you  have a Interior and spatial designer?:  No  Durable Medical Equipment  Functional Review  Ability to seek help/take action for Emergent/Urgent situations i.e. fire, crime, inclement weather or health crisis.:  Independent  Ability handle personal hygiene needs (bathing/dressing/grooming):  Independent  Ability to manage medications:  Independent  Ability to prepare food:  Independent  Ability to maintain home (clean home, laundry):  Independent  Ability to drive and/or has transportation:  Independent  Ability to do shopping:  Independent  Ability to manage finances:  Independent  Is patient able to live independently?:  Yes  Hearing and Vision  Visual Impairment:  None  Hearing Impairment:  None  Care Transitions Interventions  No Identified Needs       Plan to return home with spouse, no HHC or DME needs at this time. Agreed to CTC f/u calls after d/c.        Follow Up  Future Appointments  Date Time Provider [place]   06/01/2017 3:00 PM Luiz Iron, APRN - CNP [facility]   02/07/2018 2:00 PM  Tiffanie Rita Ohara, APRN - CNP [facility]       Health Maintenance  Health Maintenance Due   Topic Date Due   . Breast cancer screen  10/23/2016       Iver Nestle, RN

## 2017-05-07 LAB — BASIC METABOLIC PANEL
Anion Gap: 11 (ref 3–16)
BUN: 17 mg/dL (ref 7–20)
CO2: 30 mmol/L (ref 21–32)
Calcium: 9.2 mg/dL (ref 8.3–10.6)
Chloride: 101 mmol/L (ref 99–110)
Creatinine: 1.1 mg/dL (ref 0.6–1.1)
GFR African American: >60 (ref >60)
GFR Non-African American: 51 — AB (ref >60)
Glucose: 112 mg/dL — ABNORMAL HIGH (ref 70–99)
Potassium: 3.8 mmol/L (ref 3.5–5.1)
Sodium: 142 mmol/L (ref 136–145)

## 2017-05-07 LAB — CBC WITH AUTO DIFFERENTIAL
Basophils %: 0.5 %
Basophils Absolute: 0 10*3/uL (ref 0.0–0.2)
Eosinophils %: 4 %
Eosinophils Absolute: 0.2 10*3/uL (ref 0.0–0.6)
Hematocrit: 39.1 % (ref 36.0–48.0)
Hemoglobin: 13 g/dL (ref 12.0–16.0)
Lymphocytes %: 38.5 %
Lymphocytes Absolute: 2.2 10*3/uL (ref 1.0–5.1)
MCH: 29.3 pg (ref 26.0–34.0)
MCHC: 33.3 g/dL (ref 31.0–36.0)
MCV: 88 fL (ref 80.0–100.0)
MPV: 8.6 fL (ref 5.0–10.5)
Monocytes %: 10.6 %
Monocytes Absolute: 0.6 10*3/uL (ref 0.0–1.3)
Neutrophils %: 46.4 %
Neutrophils Absolute: 2.7 10*3/uL (ref 1.7–7.7)
Platelets: 168 10*3/uL (ref 135–450)
RBC: 4.44 M/uL (ref 4.00–5.20)
RDW: 13.5 % (ref 12.4–15.4)
WBC: 5.8 10*3/uL (ref 4.0–11.0)

## 2017-05-07 LAB — APTT
aPTT: 38.7 s — ABNORMAL HIGH (ref 26.0–36.0)
aPTT: 65.7 s — ABNORMAL HIGH (ref 26.0–36.0)
aPTT: 60 s — ABNORMAL HIGH (ref 26.0–36.0)

## 2017-05-07 LAB — EJECTION FRACTION PERCENTAGE: Left Ventricular Ejection Fraction High Value: 60 %

## 2017-05-07 LAB — MAGNESIUM: Magnesium: 2.2 mg/dL (ref 1.80–2.40)

## 2017-05-07 MED ORDER — SUCRALFATE 1 G PO TABS
1 GM | ORAL_TABLET | Freq: Four times a day (QID) | ORAL | 0 refills | Status: DC
Start: 2017-05-07 — End: 2017-08-26

## 2017-05-07 MED ORDER — NORMAL SALINE FLUSH 0.9 % IV SOLN
0.9 % | Freq: Two times a day (BID) | INTRAVENOUS | Status: DC
Start: 2017-05-07 — End: 2017-05-07

## 2017-05-07 MED ORDER — HEPARIN (PORCINE) IN NACL 2-0.9 UNIT/ML-% IJ SOLN
2-0.9 | INTRAMUSCULAR | Status: AC
Start: 2017-05-07 — End: 2017-05-07

## 2017-05-07 MED ORDER — FENTANYL CITRATE (PF) 100 MCG/2ML IJ SOLN
100 | INTRAMUSCULAR | Status: AC
Start: 2017-05-07 — End: 2017-05-07

## 2017-05-07 MED ORDER — PANTOPRAZOLE SODIUM 40 MG PO TBEC
40 MG | ORAL_TABLET | Freq: Two times a day (BID) | ORAL | 0 refills | Status: DC
Start: 2017-05-07 — End: 2018-03-17

## 2017-05-07 MED ORDER — LIDOCAINE HCL (PF) 1 % IJ SOLN
1 | INTRAMUSCULAR | Status: AC
Start: 2017-05-07 — End: 2017-05-07

## 2017-05-07 MED ORDER — MIDAZOLAM HCL 2 MG/2ML IJ SOLN
2 | INTRAMUSCULAR | Status: AC
Start: 2017-05-07 — End: 2017-05-07

## 2017-05-07 MED ORDER — NORMAL SALINE FLUSH 0.9 % IV SOLN
0.9 % | INTRAVENOUS | Status: DC | PRN
Start: 2017-05-07 — End: 2017-05-07

## 2017-05-07 MED ORDER — ACETAMINOPHEN 325 MG PO TABS
325 MG | ORAL | Status: DC | PRN
Start: 2017-05-07 — End: 2017-05-07

## 2017-05-07 MED FILL — HEPARIN SOD (PORCINE) IN D5W 100 UNIT/ML IV SOLN: 100 UNIT/ML | INTRAVENOUS | Qty: 250

## 2017-05-07 MED FILL — SINGULAIR 10 MG PO TABS: 10 MG | ORAL | Qty: 1

## 2017-05-07 MED FILL — LIDOCAINE HCL (PF) 1 % IJ SOLN: 1 % | INTRAMUSCULAR | Qty: 30

## 2017-05-07 MED FILL — MORPHINE SULFATE 2 MG/ML IJ SOLN: 2 mg/mL | INTRAMUSCULAR | Qty: 1

## 2017-05-07 MED FILL — FENTANYL CITRATE (PF) 100 MCG/2ML IJ SOLN: 100 MCG/2ML | INTRAMUSCULAR | Qty: 2

## 2017-05-07 MED FILL — MORPHINE SULFATE 2 MG/ML IJ SOLN: 2 MG/ML | INTRAMUSCULAR | Qty: 1

## 2017-05-07 MED FILL — SODIUM CHLORIDE FLUSH 0.9 % IV SOLN: 0.9 % | INTRAVENOUS | Qty: 10

## 2017-05-07 MED FILL — MIDAZOLAM HCL 2 MG/2ML IJ SOLN: 2 MG/ML | INTRAMUSCULAR | Qty: 2

## 2017-05-07 MED FILL — CETIRIZINE HCL 10 MG PO TABS: 10 MG | ORAL | Qty: 1

## 2017-05-07 MED FILL — EFFIENT 10 MG PO TABS: 10 MG | ORAL | Qty: 1

## 2017-05-07 MED FILL — EFFEXOR XR 150 MG PO CP24: 150 MG | ORAL | Qty: 1

## 2017-05-07 MED FILL — RANEXA 500 MG PO TB12: 500 MG | ORAL | Qty: 1

## 2017-05-07 MED FILL — ASPIRIN EC 81 MG PO TBEC: 81 MG | ORAL | Qty: 1

## 2017-05-07 MED FILL — ONDANSETRON HCL 4 MG/2ML IJ SOLN: 4 MG/2ML | INTRAMUSCULAR | Qty: 2

## 2017-05-07 MED FILL — LISINOPRIL 20 MG PO TABS: 20 MG | ORAL | Qty: 1

## 2017-05-07 MED FILL — HEPARIN (PORCINE) IN NACL 2-0.9 UNIT/ML-% IJ SOLN: INTRAMUSCULAR | Qty: 1000

## 2017-05-07 MED FILL — HEPARIN SODIUM (PORCINE) 1000 UNIT/ML IJ SOLN: 1000 UNIT/ML | INTRAMUSCULAR | Qty: 10

## 2017-05-07 MED FILL — TRAZODONE HCL 50 MG PO TABS: 50 MG | ORAL | Qty: 2

## 2017-05-07 NOTE — Other (Signed)
NOTICE OF ADMISSION WILL BE COMPLETED BY FACILITY UR DEPT VIA REVIEW LINK

## 2017-05-07 NOTE — Pre Sedation (Signed)
Brief Pre-Op Note/Sedation Assessment      Rachael Mays  1956/09/01  [id number]  [id number]  9:20 AM    Planned Procedure: Cardiac Catheterization Procedure    Post Procedure Plan: Return to same level of care    Consent: I have discussed with the patient and/or the patient representative the indication, alternatives, and the possible risks and/or complications of the planned procedure and the anesthesia methods. The patient and/or patient representative appear to understand and agree to proceed.    Chief Complaint: Chest Pain/Pressure  Anginal Equivalent      Indications for Cath Procedure:  Suspected CAD  Anginal Classification within 2 weeks:  CCS III - Symptoms with everyday living activities, i.e., moderate limitation  NYHA Heart Failure Class within 2 weeks: No symptoms    Anti- Anginal Meds within 2 weeks:   Yes: Ranolazine    Stress or Imaging Studies Performed:  Stress Test with SPECT Result: Positive:  anterior Risk/Extent of Ischemia:  Intermediate    Vital Signs:  BP (!) 155/117   Pulse 63   Temp 98.2 F (36.8 C) (Temporal)   Resp 16   Ht 5\' 6"  (1.676 m)   Wt 295 lb 1.6 oz (133.9 kg)   SpO2 97%   BMI 47.63 kg/m     Allergies:  Allergies   Allergen Reactions   . Morphine Itching   . Seasonal    . Talwin [Pentazocine] Other (See Comments)     dizzy       Past Medical History:  Past Medical History:   Diagnosis Date   . Anxiety    . Asthma    . Blood circulation, collateral    . CAD (coronary artery disease)    . CHF (congestive heart failure) ([place])    . Chronic back pain    . Deep vein thrombosis    . Depression    . GERD (gastroesophageal reflux disease)     NO LONGER SINCE LAP   . GERD (gastroesophageal reflux disease) 01/23/2009   . Hx of blood clots    . Hyperlipidemia     hx; resolved with lap band   . Hypertension    . Idiopathic peripheral neuropathy 03/26/2017   . Obesity     hx of; had lap band   . Obstructive sleep apnea (adult) (pediatric) 09/18/2015   . Pulmonary embolism [facility])           Surgical History:  Past Surgical History:   Procedure Laterality Date   . BACK SURGERY      neck  plates   . BREAST SURGERY      left lumpectomy   . CESAREAN SECTION     . CHOLECYSTECTOMY     . COLONOSCOPY     . COLONOSCOPY  04/08/10   . CORONARY ANGIOPLASTY     . DIAGNOSTIC CARDIAC CATH LAB PROCEDURE     . DILATATION, ESOPHAGUS     . ENDOSCOPY, COLON, DIAGNOSTIC     . HYSTERECTOMY     . LAP BAND  05/01/08    Dr. Nicole Cella   . OTHER SURGICAL HISTORY      [place] Filter: curently in place   . OTHER SURGICAL HISTORY  07/05/13    lap band removal   . PARTIAL HYSTERECTOMY     . SHOULDER SURGERY     . UPPER GASTROINTESTINAL ENDOSCOPY  05/19/13   . UPPER GASTROINTESTINAL ENDOSCOPY  07/21/13    ESOPHAGEAL STENT PLACEMENT   . UPPER GASTROINTESTINAL ENDOSCOPY  N/A 07/31/2014    Esophagogastroduodenoscopy with esophageal balloon dilation   . VASCULAR SURGERY  04/2015    Lucendia Herrlich, celiac artery angiogram, normal abd arteries         Medications:  Current Facility-Administered Medications   Medication Dose Route Frequency Provider Last Rate Last Dose   . prednisoLONE acetate (PRED FORTE) 1 % ophthalmic suspension 1 drop  1 drop Left Eye 4 times per day Catalina Gravel, MD   1 drop at 05/07/17 0713   . ketorolac (ACULAR) 0.5 % ophthalmic solution 1 drop  1 drop Left Eye 4x Daily Catalina Gravel, MD   1 drop at 05/06/17 2255   . heparin (porcine) injection 4,000 Units  4,000 Units Intravenous PRN Catalina Gravel, MD       . heparin (porcine) injection 2,000 Units  2,000 Units Intravenous PRN Catalina Gravel, MD   2,000 Units at 05/07/17 0037   . heparin 25,000 units in dextrose 5% 250 mL infusion  7.5 Units/kg/hr Intravenous Continuous Catalina Gravel, MD 12.7 mL/hr at 05/07/17 0038 9.5 Units/kg/hr at 05/07/17 0038   . morphine injection 2 mg  2 mg Intravenous Q4H PRN Catalina Gravel, MD   2 mg at 05/07/17 0502   . lactulose (CHRONULAC) 10 GM/15ML solution 10 g  10 g Oral Daily PRN Catalina Gravel, MD   10 g at 05/06/17 1840   . nitroGLYCERIN (NITROSTAT) SL  tablet 0.4 mg  0.4 mg Sublingual Q5 Min PRN Dionne Milo, MD       . iopamidol (ISOVUE-370) 76 % injection 90 mL  90 mL Intravenous ONCE PRN Dionne Milo, MD       . albuterol sulfate HFA 108 (90 Base) MCG/ACT inhaler 2 puff  2 puff Inhalation Q6H PRN Mompoloki N[place], MD       . aspirin EC tablet 81 mg  81 mg Oral Daily Mompoloki N[place]um[place], MD   81 mg at 05/06/17 1008   . cetirizine (ZYRTEC) tablet 10 mg  10 mg Oral Daily Mompoloki N[place]um[place], MD   10 mg at 05/06/17 1008   . lisinopril (PRINIVIL;ZESTRIL) tablet 20 mg  20 mg Oral Daily Mompoloki N[place], MD   20 mg at 05/06/17 1740   . montelukast (SINGULAIR) tablet 10 mg  10 mg Oral Nightly Mompoloki N[place]um[place], MD   10 mg at 05/06/17 2238   . oxyCODONE-acetaminophen (PERCOCET) 5-325 MG per tablet 1 tablet  1 tablet Oral Q6H PRN Mompoloki N[place]um[place], MD   1 tablet at 05/06/17 1023   . prasugrel (EFFIENT) tablet 10 mg  10 mg Oral Daily Mompoloki N[place]um[place], MD   10 mg at 05/06/17 1008   . ranolazine (RANEXA) extended release tablet 500 mg  500 mg Oral BID Mompoloki N[place]um[place], MD   500 mg at 05/06/17 2236   . spironolactone (ALDACTONE) tablet 37.5 mg  37.5 mg Oral Daily Mompoloki N[place]um[place], MD   37.5 mg at 05/06/17 1741   . torsemide (DEMADEX) tablet 20 mg  20 mg Oral Daily Mompoloki N[place]um[place], MD   20 mg at 05/06/17 1740   . traZODone (DESYREL) tablet 100 mg  100 mg Oral Nightly Mompoloki N[place]um[place], MD   100 mg at 05/06/17 2236   . venlafaxine (EFFEXOR XR) extended release capsule 150 mg  150 mg Oral Daily Mompoloki N[place]umane, MD   150 mg at 05/06/17 2238   . sodium chloride flush 0.9 % injection 10 mL  10 mL Intravenous 2 times per day Charlane Ferretti, MD   10 mL at 05/06/17 2237   .  sodium chloride flush 0.9 % injection 10 mL  10 mL Intravenous PRN Mompoloki N[place], MD       . magnesium hydroxide (MILK OF MAGNESIA) 400 MG/5ML suspension 30 mL  30 mL Oral Daily PRN Mompoloki N[place], MD       . ondansetron (ZOFRAN) injection 4 mg  4 mg Intravenous  Q6H PRN Mompoloki N[place], MD       . pantoprazole (PROTONIX) tablet 40 mg  40 mg Oral QAM AC Mompoloki N[place], MD   40 mg at 05/06/17 1010           Pre-Sedation:    Pre-Sedation Documentation and Exam:  I have personally completed a history, physical exam & review of systems for this patient (see notes).  I have assessed the patient and agree with the H&P present on the chart.    Prior History of Anesthesia Complications:   none    Modified Mallampati:  III (soft palate, base of uvula visible)    ASA Classification:  Class 2 - A normal healthy patient with mild systemic disease and Class 2 -- A normal healthy patient with mild systemic disease    Aldrete Scale:  Activity:  2 - Able to move 4 extremities voluntarily on command  Respiration:  2 - Able to breathe deeply and cough freely  Circulation:  2 - BP+/- 4mmHg of normal  Consciousness:  2 - Fully awake  Oxygen Saturation (color):  2 - Able to maintain oxygen saturation >92% on room air    Sedation/Anesthesia Plan:  Guard the patient's safety and welfare.  Minimize physical discomfort and pain.  Minimize negative psychological responses to treatment by providing sedation and analgesia and maximize the potential amnesia.  Patient to meet pre-procedure discharge plan.    Medication Planned:  midazolam intravenously and fentanyl intravenously    Patient is an appropriate candidate for plan of sedation: yes      Electronically signed by Leodis Binet, MD on 05/07/2017 at 9:20 AM

## 2017-05-07 NOTE — Plan of Care (Signed)
Problem: PAIN  Goal: Pain control  Patient will demonstrate personal actions to control pain.     Outcome: Ongoing  Pt c/o chest pain. Pain is controlled with IV morphine, medicated per order. Will monitor.

## 2017-05-07 NOTE — Consults (Signed)
[place]  [phone]        Reason for Consultation/Chief Complaint: "I have been having chest pain."    History of Present Illness:  Rachael Mays is a 61 y.o. patient who presented to the hospital with complaints of chest pain.  She presents with left-sided chest discomfort similar to past complaints.  She states at times there is complete resolution with nitroglycerin but chronicity has been bad in the past 2 months.  The discomfort seems to be present more than it is absent.  She is a bit of a difficult historian.    Past history:  She had normal coronaries angiogram in October 2016 and June 2013.  She underwent repeat cardiac cath in April 2017 and at that time was noted to have significant stenosis in the mid LAD which was revascularized with drug-eluting stent implantation.  This had no impact on her chest pain in terms of resolution.  She has morbid obesity and underwent lap band surgery.  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  She was seen by Dr. Jeb Levering who did a follow up CTA of her chest and CTPA  for continued shortness of breath 03/25/16 that showed no evidence of pulmonary embolism or acute pulmonary abnormality. A chest xray on the same day suggested congestive heart failure.  Her diuretics were increased and her swelling improved but she essentially had no improvement in her shortness of breath and chest pain.  The chest pain did have a component of tenderness on palpation in the past.  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  It was hypothesized that this may be contributing to the left-sided chest discomfort.  She has been seen by Dr. Diana Eves, pain management.  She was hospitalized 06/16/16-06/18/16 for chest pain.    She was hospitalized in August 2017 with chest pain and shortness of breath.  She ruled out for MI.  She underwent medication adjustment which helped  some.  She was seen recent in the ER on 12/20/16 with chest pain.  She has had chest pain that is left sided, sharp in nature, no reproducible/identifiable trigger.  She also has episodes of chest discomfort described as heaviness.  NTG has helped in the past but not with 12/20/16 chest pain episode.  She continues to follow with Dr. Diana Eves for pain management.  She feels like she is swelling in her face and hands, but avoids sodium/salt and eats a lot of fresh fruits and vegetables.  She is compliant with Bi-PAP machine.  She follows with heart failure NP for management of diastolic heart failure, but she has not seen Dr. Jacqualyn Posey.  She wants to exercise, but has been unable to due to current symptoms.      Past Medical History:   has a past medical history of Anxiety; Asthma; Blood circulation, collateral; CAD (coronary artery disease); CHF (congestive heart failure) ([place]); Chronic back pain; Deep vein thrombosis; Depression; GERD (gastroesophageal reflux disease); GERD (gastroesophageal reflux disease); Hx of blood clots; Hyperlipidemia; Hypertension; Idiopathic peripheral neuropathy; Obesity; Obstructive sleep apnea (adult) (pediatric); and Pulmonary embolism ([place]).    Surgical History:   has a past surgical history that includes Colonoscopy; Cesarean section; Cholecystectomy; Hysterectomy; shoulder surgery; Lap Band (05/01/08); back surgery; Colonoscopy (04/08/10); other surgical history; Upper gastrointestinal endoscopy (05/19/13); other surgical history (07/05/13); Upper gastrointestinal endoscopy (07/21/13); partial hysterectomy (cervix not removed); Upper gastrointestinal endoscopy (  N/A, 07/31/2014); vascular surgery (04/2015); Diagnostic Cardiac Cath Lab Procedure; Coronary angioplasty; Endoscopy, colon, diagnostic; Breast surgery; and Dilatation, esophagus.     Social History:   reports that she quit smoking about 4 years ago. Her smoking use included Cigarettes. She has a 20.00 pack-year smoking history. She quit  smokeless tobacco use about 3 years ago. She reports that she does not drink alcohol or use drugs.     Family History:  family history includes Arthritis in her mother; Cancer in her mother; Cancer (age of onset: 75) in her father; Depression in her mother; Diabetes in an other family member; Early Death in her paternal grandmother; Heart Disease in her maternal grandmother and paternal grandfather; Heart Disease (age of onset: 42) in her mother; High Blood Pressure in her mother and another family member; Obesity in an other family member; Other in her brother, brother, daughter, and sister; Ovarian Cancer (age of onset: 65) in her mother.    Home Medications:  Were reviewed and are listed in nursing record. and/or listed below  Prior to Admission medications    Medication Sig Start Date End Date Taking? Authorizing Provider   ALPRAZolam (XANAX) 0.25 MG tablet Take 0.25 mg by mouth 3 times daily as needed for Sleep..   Yes Historical Provider, MD   omeprazole (PRILOSEC) 40 MG delayed release capsule TAKE ONE CAPSULE BY MOUTH ONE TIME A DAY 05/04/17  Yes Harvel Quale, MD   ranolazine [facility]) 500 MG extended release tablet Take 1 tablet by mouth 2 times daily 04/29/17  Yes Luiz Iron, APRN - CNP   metoprolol (LOPRESSOR) 100 MG tablet TAKE 1 AND 1/2 TABLET BY MOUTH TWO TIMES A DAY 04/12/17  Yes Harvel Quale, MD   lisinopril (PRINIVIL;ZESTRIL) 40 MG tablet TAKE ONE TABLET BY MOUTH ONE TIME A DAY 04/12/17  Yes Harvel Quale, MD   prasugrel (EFFIENT) 10 MG TABS TAKE 1 TABLET BY MOUTH DAILY 03/29/17  Yes Leodis Binet, MD   aspirin 81 MG EC tablet Take 1 tablet by mouth daily 02/16/17  Yes Harvel Quale, MD   fluocinonide (LIDEX) 0.05 % external solution APPLY SPARINGLY TO THE BACK OF THE SCALP DAILY IF NEEDED FOR ITCHING 02/16/17  Yes Harvel Quale, MD   montelukast (SINGULAIR) 10 MG tablet TAKE ONE TABLET BY MOUTH ONE TIME A DAY 02/16/17  Yes Harvel Quale, MD    spironolactone (ALDACTONE) 25 MG tablet Take 1.5 tablets by mouth daily 02/16/17  Yes Harvel Quale, MD   traZODone (DESYREL) 100 MG tablet TAKE ONE TABLET BY MOUTH NIGHTLY 02/16/17  Yes Lucky Rathke Rayford Halsted, MD   venlafaxine (EFFEXOR XR) 150 MG extended release capsule Take 1 capsule by mouth daily 02/16/17  Yes Harvel Quale, MD   nitroGLYCERIN (NITROSTAT) 0.4 MG SL tablet up to max of 3 total doses. If no relief after 1 dose, call 911. 02/16/17  Yes Harvel Quale, MD   torsemide [facility]) 20 MG tablet Take 2 tablets by mouth daily  Patient taking differently: Take 20 mg by mouth daily Takes an extra dose for a wt gain > 3# 01/11/17  Yes Connye Burkitt, MD   triamcinolone (KENALOG) 0.1 % cream Apply topically 2 times daily.Apply with Q tip in each ear BID for 1 week. 01/04/17  Yes Alvina Chou, MD   mupirocin (BACTROBAN) 2 % ointment Apply topically  Daily.Apply with Q tip in each naris  at night 01/04/17  Yes Alvina Chou, MD   oxyCODONE-acetaminophen (PERCOCET) 5-325 MG per tablet Take 1 tablet by mouth every 6 hours as needed for Pain .  Patient taking differently: Take 1 tablet by mouth every 8 hours as needed for Pain. . 04/07/16  Yes Bhumit Patel, MD   albuterol sulfate HFA (PROVENTIL HFA) 108 (90 BASE) MCG/ACT inhaler Inhale 2 puffs into the lungs every 6 hours as needed for Wheezing 12/27/15  Yes Harvel Quale, MD   Multiple Vitamins-Minerals (MULTIVITAMIN PO) Take 1 tablet by mouth daily    Yes Historical Provider, MD   CALCIUM-VITAMIN D PO Take 1 tablet by mouth daily    Yes Historical Provider, MD   cetirizine (ZYRTEC ALLERGY) 10 MG tablet Take 10 mg by mouth daily.   Yes Historical Provider, MD        Allergies:  Morphine; Seasonal; and [place]win [pentazocine]     Review of Systems:   A complete review of systems has been reviewed and updated today and is negative except as noted in the history of present illness.      Physical Examination:    Vitals:    05/07/17  0501   BP: (!) 162/72   Pulse: 73   Resp: 15   Temp: 97.2 F (36.2 C)   SpO2: 98%    Weight: 294 lb 1.6 oz (133.4 kg)         General Appearance:  Alert, cooperative, no distress, appears stated age   Head:  Normocephalic, without obvious abnormality, atraumatic   Eyes:  EOMI, conjunctiva/corneas clear       Nose: Nares normal   Throat: Lips normal   Neck: Supple, symmetrical, trachea midline,  no carotid bruit or JVD       Lungs:   Clear to auscultation bilaterally, respirations unlabored   Chest Wall:  No tenderness or deformity   Heart:  Regular rate and rhythm, S1, S2 normal, no significant murmur, rub or gallop   Abdomen:   Soft, non-tender, bowel sounds active all four quadrants,  no masses, no organomegaly           Extremities: Extremities normal, atraumatic, no cyanosis or edema   Pulses: 2+ and symmetric   Skin: Skin color, texture, turgor normal, no rashes or lesions   Pysch: Normal mood and affect   Neurologic: Normal gross motor and sensory exam.         Labs  CBC:   Lab Results   Component Value Date    WBC 5.8 05/07/2017    RBC 4.44 05/07/2017    HGB 13.0 05/07/2017    HCT 39.1 05/07/2017    MCV 88.0 05/07/2017    RDW 13.5 05/07/2017    PLT 168 05/07/2017     CMP:    Lab Results   Component Value Date    NA 142 05/07/2017    K 3.8 05/07/2017    K 3.9 05/05/2017    CL 101 05/07/2017    CO2 30 05/07/2017    BUN 17 05/07/2017    CREATININE 1.1 05/07/2017    GFRAA >60 05/07/2017    GFRAA >60 03/16/2012    AGRATIO 1.2 05/05/2017    LABGLOM 51 05/07/2017    GLUCOSE 112 05/07/2017    PROT 7.1 05/05/2017    PROT 7.3 03/31/2011    CALCIUM 9.2 05/07/2017    BILITOT <0.2 05/05/2017    ALKPHOS 89 05/05/2017    AST 28 05/05/2017  ALT 41 05/05/2017     PT/INR:  No results found for: PTINR  Lab Results   Component Value Date    CKTOTAL 157 07/15/2016    TROPONINI <0.01 05/05/2017       EKG:  I have reviewed EKG with the following interpretation:  Impression:  Sinus rhythm, poor wave progression cannot rule out  anterior infarct age undetermined.    Myoview stress test 05/06/17:  Summary      No EKG evidence for ischemia with exercise      Normal LV size and systolic function.      Myocardial perfusion imaging with moderate sized, moderate severity area of   decreased uptake in the anterior, anteroapical and anterolateral walls with   stress with redistribution at rest consistent with ischemia in the territory   of the LAD.       Cardiac Cath 07/17/16:  Anatomy:   LM-normal  LAD-widely patent stent otherwise normal  Cx-normal, dominant  OM1- normal  RCA-normal  LVEF- 70%, LVEDP 15 mmHg    Impression:  1.  Widely patent LAD stent otherwise normal coronary arteries  2.  Hyperdynamic LV systolic function  3.  Non-cardiac chest pain    Plan:  1.  Medical management    CTPA 12/21/16:  Negative for acute pulmonary embolism.      Mild airway inflammation. No consolidative pneumonia.     CTPA 06/16/16:  No evidence of pulmonary embolism.      No acute abnormality.     Cardiac monitor 06/01/16:  Predominantly sinus rhythm with occasional PACs and PVCs    Echo 04/06/16:  Summary  Normal left ventricle size and systolic function with an estimated ejectionfraction of 65%.  There is mild concentric left ventricular hypertrophy.  Mild mitral regurgitation is present.  The aortic valve appears sclerotic but opens well.  There is mild tricuspid regurgitation with RVSP estimated at 42 mmHg.  Mild pulmonic regurgitation present.  Definity was used to better delineate endocardium.    Cardiac Cath 03/31/16:  Anatomy:  LM-normal  LAD-70% mid with FFR 0.77  Cx-normal, codominant  OM1- normal  RCA-normal  RPDA- normal  LVEF- 70%, LVEDP 19   PCI: LAD 70% to 0% with 3.25 mm x 18 mm Xience Alpine drug-eluting stent    Hemodynamics:  RA-15/12 mean 9  RV- 60/0, 9  PAWP-20/26 mean19  PA- 60/12 mean 33      Impression:  1. 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  2. Hyperdynamic LV  systolic function.  3. Mildly decompensated diastolic CHF.  4. Moderate pulmonary hypertension.    Plan:  1. Aspirin indefinitely.  2. Effient/equivalent for minimum of 1 year.  3. IV diuresis while in the hospital and then discharged on Lasix 40 mg by mouth twice a day.  4. Continue BiPAP therapy.  5. Outpatient cardiac rehab.  6. CHF service follow-up on discharge.      CTPA 03/25/16:  No evidence of pulmonary embolism or acute pulmonary abnormality.    Chest x-ray 03/25/16:  No acute process.    ECG 25-Mar-2016 17:28:21 [facility]:  Sinus bradycardia  Otherwise normal ECG  No significant change    CTPA 03/10/16:  No evidence of pulmonary embolus.   Small right and trace left pleural effusions, new as well as interlobular   septal wall thickening. Findings likely related to edema.   Mild mediastinal adenopathy, new. Findings may be reactive. Recommend   follow-up to ensure resolution.  Chest x-ray 02/13/16:  FINDINGS:   Cardiomegaly. There appears be pulmonary vascular congestion and   interstitial edema. No focal airspace disease.           Impression   Findings suggest congestive heart failure        Echo 10/07/15:  Summary  Technically limited study due to body habitus.  Normal left ventricle size and systolic function with an estimatedejection fraction of 55%. No regional wall motion abnormalities are seen.  There is mild concentric left ventricular hypertrophy.    Cardiac Cath 09/20/15:  Anatomy:   LM-normal  LAD-normal  Cx-normal  OM1- normal  RCA-normal, codominant  RPDA- normal  LVEF- 60%    Impression:  1. Normal coronaries  2. Normal LV systolic function  3. False positive stress test      Myoview Stress Test  09/18/15:  Summary   -Moderate sized anterior mostly reversible defect consistent with ischemia   in the territory of the mid LAD .   -Normal LV function.   -Study is limited by breast attenuation.       Cardiac cath 06/03/12:  Normal coronaries and LVEF        Assessment  Patient Active Problem List   Diagnosis   . Back pain   . Deep vein thrombosis ([place])   . Depression   . Pulmonary embolism ([place])   . Nausea & vomiting   . Abdominal  pain, other specified site   . Morbid obesity with BMI of 40.0-44.9, adult ([place])   . Status post gastric banding   . Vertigo   . Dizziness   . Tinnitus, subjective   . Other complications of gastric band procedure   . Essential hypertension   . Hyperglycemia   . Atypical chest pain   . 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 Adrenal mass, left ([place])   . Adrenal nodule ([place])   . Obstructive sleep apnea   . Dyspnea and respiratory abnormalities   . Abnormal stress test   . Shortness of breath   . Chronic diastolic congestive heart failure ([place])   . Coronary artery disease due to lipid rich plaque   . Pulmonary hypertension   . 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 Chest pain   . Acute actinic otitis externa of both ears   . Anxiety and depression   . Bradycardia   . Dyslipidemia   . Chronic eczematous otitis externa of both ears   . Mucositis   . Sprain of radiocarpal joint of right wrist   . Idiopathic peripheral neuropathy   . Chest pain in adult   . Hypoxia   . Chronic pain syndrome   . Unstable angina ([place])       1.  Chest pain.  Recurrent.  Doubtful to be cardiac in etiology but when cardiac cath did show a significant LAD stenosis which was stented.  2.  Abnormal stress test.  Similar to past false positive stress test.  3.  CAD status post PCI of LAD with DES.      Plan:    I had the opportunity to review the clinical symptoms and presentation of Rachael Mays.  She presents with recurrent chest discomfort.  While her heart score risk is elevated the story is felt to be cardiac.  Given the presence of LAD stent, I think we have to proceed with cardiac cath for definitive assessment.  If there is no significant CAD,  we really need to focus on noncardiac etiologies of her chest  pain.    Thank you for allowing [place] to participate in the care of Rachael Mays. Further evaluation will be based upon the patient's clinical course and testing results.    All questions and concerns were addressed to the patient/family. Alternatives to my treatment were discussed. The note was completed using EMR. Every effort was made to ensure accuracy; however, inadvertent computerized transcription errors may be present.

## 2017-05-07 NOTE — Progress Notes (Addendum)
Patient's aPTT 60.0. No bolus/ No change. 2nd consecutive aPTT therapeutic. Next aPTT draw tomorrow a.m. Will monitor.

## 2017-05-07 NOTE — Progress Notes (Signed)
[facility] PROGRESS NOTE    05/07/2017 3:10 PM        Name: Rachael Mays .              Admitted: 05/05/2017  Primary Care Provider: Acquanetta Sit SLOUGH, MD (Tel: [phone])      Subjective:  CC:  CP    CP controlled with Morphine IV.  No HA, SOB, abdominal pain or fevers.    Reviewed interval ancillary notes    Current Medications    prednisoLONE acetate (PRED FORTE) 1 % ophthalmic suspension 1 drop 4 times per day   ketorolac (ACULAR) 0.5 % ophthalmic solution 1 drop 4x Daily   heparin (porcine) injection 4,000 Units PRN   heparin (porcine) injection 2,000 Units PRN   heparin 25,000 units in dextrose 5% 250 mL infusion Continuous   morphine injection 2 mg Q4H PRN   lactulose (CHRONULAC) 10 GM/15ML solution 10 g Daily PRN   nitroGLYCERIN (NITROSTAT) SL tablet 0.4 mg Q5 Min PRN   iopamidol (ISOVUE-370) 76 % injection 90 mL ONCE PRN   albuterol sulfate HFA 108 (90 Base) MCG/ACT inhaler 2 puff Q6H PRN   aspirin EC tablet 81 mg Daily   cetirizine (ZYRTEC) tablet 10 mg Daily   lisinopril (PRINIVIL;ZESTRIL) tablet 20 mg Daily   montelukast (SINGULAIR) tablet 10 mg Nightly   oxyCODONE-acetaminophen (PERCOCET) 5-325 MG per tablet 1 tablet Q6H PRN   prasugrel (EFFIENT) tablet 10 mg Daily   ranolazine (RANEXA) extended release tablet 500 mg BID   spironolactone (ALDACTONE) tablet 37.5 mg Daily   torsemide (DEMADEX) tablet 20 mg Daily   traZODone (DESYREL) tablet 100 mg Nightly   venlafaxine (EFFEXOR XR) extended release capsule 150 mg Daily   sodium chloride flush 0.9 % injection 10 mL 2 times per day   sodium chloride flush 0.9 % injection 10 mL PRN   magnesium hydroxide (MILK OF MAGNESIA) 400 MG/5ML suspension 30 mL Daily PRN   ondansetron (ZOFRAN) injection 4 mg Q6H PRN   pantoprazole (PROTONIX) tablet 40 mg QAM AC       Objective:  BP 120/71   Pulse 80   Temp 98.2 F (36.8 C) (Temporal)   Resp 18   Ht 5\' 6"  (1.676 m)   Wt  295 lb 1.6 oz (133.9 kg)   SpO2 97%   BMI 47.63 kg/m   No intake or output data in the 24 hours ending 05/07/17 1510   Wt Readings from Last 3 Encounters:   05/07/17 295 lb 1.6 oz (133.9 kg)   04/29/17 295 lb (133.8 kg)   04/09/17 294 lb (133.4 kg)       General appearance:  Appears comfortable, morbidly obese, pleasant  Eyes: Sclera injected, pupils equal  ENT: Moist mucus membranes, no thrush. Trachea midline.  Cardiovascular: Regular rhythm, normal S1, S2. No murmur, gallop, rub. No edema in lower extremities  Respiratory: Clear to auscultation bilaterally, no wheeze, good inspiratory effort  Gastrointestinal: Abdomen soft, obese, non-tender, not distended, normal bowel sounds  Musculoskeletal: No cyanosis in digits, neck supple.  Chest pain not reproducible on exam.  Neurology: Grossly intact. Alert and oriented in time, place and person. No speech or motor deficits  Psychiatry: Anxious affect. Not agitated  Skin: Warm, dry, normal turgor, no rash    Labs and Tests:  CBC:   Recent Labs      05/05/17   0321  05/06/17   0542  05/07/17   0445   WBC  7.4  6.3  5.8   HGB  13.1  13.2  13.0   PLT  179  184  168     BMP:    Recent Labs      05/05/17   0321  05/06/17   0542  05/07/17   0445   NA  141  141  142   K  3.9  5.0  3.8   CL  103  102  101   CO2  25  24  30    BUN  23*  20  17   CREATININE  1.2*  1.1  1.1   GLUCOSE  150*  99  112*     Hepatic:   Recent Labs      05/05/17   0321   AST  28   ALT  41*   BILITOT  <0.2   ALKPHOS  89       Problem List  Principal Problem:    Unstable angina (HCC)  Active Problems:    Chronic diastolic congestive heart failure (HCC)    Coronary artery disease due to lipid rich plaque    Pulmonary hypertension    Status post insertion of drug-eluting stent into left anterior descending (LAD) artery    Dyslipidemia    Pulmonary embolism (HCC)    Nausea & vomiting    Morbid obesity with BMI of 40.0-44.9, adult (HCC)    Status post gastric banding    Dizziness    Other complications of  gastric band procedure    Essential hypertension    Obstructive sleep apnea    Anxiety and depression    Idiopathic peripheral neuropathy    Chest pain in adult    Hypoxia    Chronic pain syndrome  Resolved Problems:    * No resolved hospital problems. *       Assessment & Plan:   1. Hep gtt  2. NPO p MN for LHC  3. Morphine IV PRN pain      Diet: Diet NPO, After Midnight  Code:Full Code  DVT PPX  Hep gtt    Discussed with patient, Dr Susy Manor (Cardio), nursing and CM.    LHC today.  Will see what shows.  Possibly home in AM.      Catalina Gravel, MD   05/07/2017 3:10 PM

## 2017-05-07 NOTE — Progress Notes (Signed)
Patient transported to CATH lab.

## 2017-05-07 NOTE — Other (Addendum)
Patient Acct Nbr:  [id number]  Primary AUTH/CERT:    [place] Name:   [employer]  Primary Insurance Plan Name:  [facility] EMP  Primary Insurance Group Number:  [id number]  Primary Insurance Plan Type: N  Primary Insurance Policy Number:  [id number]

## 2017-05-07 NOTE — Brief Op Note (Signed)
Cardiac Cath 05/07/17:  Anatomy:   LM-normal  LAD-normal, widely patent stent mid  Cx-normal  OM1- normal  RCA-normal, right dominant  RPDA- normal  LVEF- 60%    Impression:  1.  Normal coronary artery anatomy.  2.  Widely patent mid LAD stent.  3.  Normal LV systolic function.  4.  False positive stress test.  Likely breast artifact.    Plan:  1.  Medical management.  2.  She needs workup of noncardiac causes of chest pain.  She now has to stress test with similar abnormalities most consistent with breast artifact.  The majority of her coronary angiograms have demonstrated normal anatomy except for the one that had a 70% mid LAD stenosis which was stented.  Her chest pain has atypical features.  Recommend rheumatology and possibly orthopedic evaluation.

## 2017-05-07 NOTE — Progress Notes (Signed)
BP (!) 155/117   Pulse 63   Temp 98.2 F (36.8 C) (Temporal)   Resp 16   Ht 5\' 6"  (1.676 m)   Wt 295 lb 1.6 oz (133.9 kg)   SpO2 97%   BMI 47.63 kg/m  Assessment complete. Patient alert & oriented x 4. VSS, Afebrile. Patient c/o chest pain 7/10- medicated per MAR. POC discussed with patient- v/u. Patient NPO for Cath- Consent signed and in chart- Heparin gtt infusing, last aPTT therapeutic- next aPTT draw due at 10:45am. No other needs voiced at this time. Call light within reach. Will continue to monitor.

## 2017-05-07 NOTE — Discharge Summary (Signed)
Hospital Medicine Discharge Summary    Patient: Rachael Mays     Gender: female  DOB: [birth year]   Age: 61 y.o.  MRN: [id number]    Admitting Physician: Charlane Ferretti, MD  Discharge Physician: Catalina Gravel, MD     Code Status: Full Code    Admit Date: 05/05/2017   Discharge Date:   05/07/17    Disposition:  Home    Discharge Diagnoses:    Active Hospital Problems    Diagnosis Date Noted   . Dyslipidemia [E78.5] 12/25/2016     Priority: High   . Pulmonary hypertension [I27.20] 03/31/2016     Priority: High   . Status post insertion of drug-eluting stent into left anterior descending (LAD) artery [Z95.5] 03/31/2016     Priority: High   . Coronary artery disease due to lipid rich plaque [I25.10, I25.83] 03/31/2016     Priority: High   . Chronic diastolic congestive heart failure ([place]) [I50.32] 03/30/2016     Priority: High   . Unstable angina ([place]) [I20.0] 05/06/2017   . Chest pain in adult [R07.9] 05/05/2017   . Hypoxia [R09.02] 05/05/2017   . Chronic pain syndrome [G89.4] 05/05/2017   . Idiopathic peripheral neuropathy [G60.9] 03/26/2017   . Anxiety and depression [F41.8] 07/15/2016   . Obstructive sleep apnea [G47.33] 09/18/2015   . Essential hypertension [I10] 07/24/2013   . Other complications of gastric band procedure [K95.09] 07/05/2013   . Dizziness [R42] 03/29/2013   . 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 Morbid obesity with BMI of 40.0-44.9, adult ([place]) [E66.01, Z68.41] 02/21/2009   . Pulmonary embolism ([place]) [I26.99] 01/23/2009   . Nausea & vomiting [R11.2] 01/23/2009       Follow-up appointments:  one week    Outpatient to do list: PCP should place consult to Rheum as outpatient for evaluation, r/o RA or fibromyalgia    Condition at Discharge:  Stable    Hospital Course:   61 y.o. female with history of morbid obesity, CAD s/p LAD stent with chronic angina on Ranexa, OSA on BiPap, remote history of PE/DVT s/p multiple negative CTA Chest in past, chronic D CHF on Demadex, GERD, HTN,  hyperlipidemia, anxiety, depression who came to ER with acute onset of L sided CP.  Placed in observation.  SPECT stress was positive.  Patient changed to inpatient status for unstable angina.  Seen by Dr Susy Manor (Cardio).  Underwent LHC that was normal.  Suspect SPECT was abnormal due to breast attenuation.  Started on aggressive GERD regimen.  PCP should refer to Rheum as OP for fibromyalgia vs other autoimmune disease work up.    Discharge Medications:   Current Discharge Medication List      START taking these medications    Details   pantoprazole (PROTONIX) 40 MG tablet Take 1 tablet by mouth 2 times daily  Qty: 60 tablet, Refills: 0      sucralfate (CARAFATE) 1 GM tablet Take 1 tablet by mouth 4 times daily for 7 days  Qty: 28 tablet, Refills: 0           Current Discharge Medication List        Current Discharge Medication List      CONTINUE these medications which have NOT CHANGED    Details   ALPRAZolam (XANAX) 0.25 MG tablet Take 0.25 mg by mouth 3 times daily as needed for Sleep..      ranolazine (RANEXA) 500 MG extended release tablet Take 1 tablet by mouth 2  times daily  Qty: 60 tablet, Refills: 3      metoprolol (LOPRESSOR) 100 MG tablet TAKE 1 AND 1/2 TABLET BY MOUTH TWO TIMES A DAY  Qty: 270 tablet, Refills: 1      lisinopril (PRINIVIL;ZESTRIL) 40 MG tablet TAKE ONE TABLET BY MOUTH ONE TIME A DAY  Qty: 90 tablet, Refills: 1    Associated Diagnoses: Essential hypertension      prasugrel (EFFIENT) 10 MG TABS TAKE 1 TABLET BY MOUTH DAILY  Qty: 90 tablet, Refills: 3      aspirin 81 MG EC tablet Take 1 tablet by mouth daily  Qty: 30 tablet, Refills: 11      fluocinonide (LIDEX) 0.05 % external solution APPLY SPARINGLY TO THE BACK OF THE SCALP DAILY IF NEEDED FOR ITCHING  Qty: 60 mL, Refills: 1      montelukast (SINGULAIR) 10 MG tablet TAKE ONE TABLET BY MOUTH ONE TIME A DAY  Qty: 90 tablet, Refills: 1      spironolactone (ALDACTONE) 25 MG tablet Take 1.5 tablets by mouth daily  Qty: 90 tablet, Refills: 3     Associated Diagnoses: Chronic diastolic congestive heart failure ([place]); Shortness of breath; Coronary artery disease due to lipid rich plaque; Essential hypertension      traZODone (DESYREL) 100 MG tablet TAKE ONE TABLET BY MOUTH NIGHTLY  Qty: 90 tablet, Refills: 3    Associated Diagnoses: Insomnia, unspecified type      venlafaxine (EFFEXOR XR) 150 MG extended release capsule Take 1 capsule by mouth daily  Qty: 90 capsule, Refills: 3    Associated Diagnoses: Depression, unspecified depression type      nitroGLYCERIN (NITROSTAT) 0.4 MG SL tablet up to max of 3 total doses. If no relief after 1 dose, call 911.  Qty: 25 tablet, Refills: 3      torsemide (DEMADEX) 20 MG tablet Take 2 tablets by mouth daily  Qty: 180 tablet, Refills: 3    Comments: --patient will run out of medication before mail order received; this is just an interim Rx.----  Associated Diagnoses: Chronic diastolic congestive heart failure ([place]); Shortness of breath; Coronary artery disease due to lipid rich plaque; Essential hypertension      triamcinolone (KENALOG) 0.1 % cream Apply topically 2 times daily.Apply with Q tip in each ear BID for 1 week.  Qty: 15 g, Refills: 1    Associated Diagnoses: Chronic eczematous otitis externa of both ears      mupirocin (BACTROBAN) 2 % ointment Apply topically  Daily.Apply with Q tip in each naris at night  Qty: 15 g, Refills: 0    Associated Diagnoses: Mucositis      oxyCODONE-acetaminophen (PERCOCET) 5-325 MG per tablet Take 1 tablet by mouth every 6 hours as needed for Pain .  Qty: 40 tablet, Refills: 0      albuterol sulfate HFA (PROVENTIL HFA) 108 (90 BASE) MCG/ACT inhaler Inhale 2 puffs into the lungs every 6 hours as needed for Wheezing  Qty: 3 Inhaler, Refills: 3      Multiple Vitamins-Minerals (MULTIVITAMIN PO) Take 1 tablet by mouth daily       CALCIUM-VITAMIN D PO Take 1 tablet by mouth daily       cetirizine (ZYRTEC ALLERGY) 10 MG tablet Take 10 mg by mouth daily.           Current Discharge  Medication List      STOP taking these medications       omeprazole (PRILOSEC) 40 MG delayed release capsule Comments:  Reason for Stopping:               Discharge ROS:  A complete review of systems was asked and negative except for CP    Discharge Exam:    BP 124/78   Pulse 77   Temp 97 F (36.1 C) (Temporal)   Resp 18   Ht 5\' 6"  (1.676 m)   Wt 295 lb 1.6 oz (133.9 kg)   SpO2 92%   BMI 47.63 kg/m   General appearance: Appears comfortable, morbidly obese, pleasant  Eyes: Sclera injected, pupils equal  ENT: Moist mucus membranes, no thrush. Trachea midline.  Cardiovascular: Regular rhythm, normal S1, S2. No murmur, gallop, rub. No edema in lower extremities  Respiratory: Clear to auscultation bilaterally, no wheeze, good inspiratory effort  Gastrointestinal: Abdomen soft, obese,non-tender, not distended, normal bowel sounds  Musculoskeletal: No cyanosis in digits, neck supple. Chest pain not reproducible on exam.  Neurology: Grossly intact. Alert and oriented in time, place and person. No speech or motor deficits  Psychiatry: Anxious affect. Not agitated  Skin: Warm, dry, normal turgor, no rash    Labs: For convenience and continuity at follow-up the following most recent labs are provided:    Lab Results   Component Value Date    WBC 5.8 05/07/2017    HGB 13.0 05/07/2017    HCT 39.1 05/07/2017    MCV 88.0 05/07/2017    PLT 168 05/07/2017    NA 142 05/07/2017    K 3.8 05/07/2017    K 3.9 05/05/2017    CL 101 05/07/2017    CO2 30 05/07/2017    BUN 17 05/07/2017    CREATININE 1.1 05/07/2017    CALCIUM 9.2 05/07/2017    PHOS 3.8 08/21/2013    ALKPHOS 89 05/05/2017    ALT 41 05/05/2017    AST 28 05/05/2017    BILITOT <0.2 05/05/2017    BILIDIR <0.2 07/15/2016    LABALBU 3.9 05/05/2017    LDLCALC 52 06/17/2016    TRIG 114 06/17/2016     Lab Results   Component Value Date    INR 0.88 05/05/2017    INR 1.06 07/15/2016    INR 0.97 06/16/2016       Radiology:  Xr Chest Standard (2 Vw)    Result Date:  05/05/2017  EXAMINATION: TWO VIEWS OF THE CHEST 05/04/2017 11:46 pm COMPARISON: January 2018 CT, 12/20/2016 radiographs HISTORY: ORDERING PHYSICIAN PROVIDED HISTORY: cp, sob TECHNOLOGIST PROVIDED HISTORY: Technologist Provided Reason for Exam: chest pain and shortness of breath Acuity: Acute Type of Encounter: Initial Relevant Medical/Surgical History: previous CAD,CHF,pulmonary embolism,hypertension,and asthma History of asthma, gastroesophageal reflux, pulmonary embolism FINDINGS: Metallic hardware projects over the lower cervical spine.  The heart size and mediastinal contours are stable.  Again noted is a prominent left epicardial fat pad. The lungs are clear.     No acute disease.     Nm Myocardial Spect Rest Exercise Or Rx    Result Date: 05/06/2017  Cardiac Perfusion Imaging  Demographics   Patient Name      RETAL TONKINSON   Date of Study     05/06/2017          Gender              Female   Patient Number    [id number]          Date of Birth       Nov 30, 1956   Visit Number      [id number]  Age                 89 year(s)   Accession Number  [id number]           Room Number         7353   Corporate ID      [id number]            NM Technician       Chrisandra Carota,                                                            CNMT   Nurse             Marval Regal, RN Interpreting        Vianne Bulls, MD                                        Physician   Ordering          Catalina Gravel, MD    Stress Interpreting Vianne Bulls, MD  Physician                             Physician   The procedure was explained in detail to the patient. Risks,  complications and alternative treatments were reviewed. Written consent  was obtained.  Procedure Procedure Type:   Nuclear Stress Test:Exercise, NM MYOCARDIAL SPECT REST EXERCISE OR RX   Study location: [facility] - Nuclear Medicine   Indications: Chest pain.             Hospital Status: Outpatient.   Risk Factors   The patient risk factors include:obesity,  treated and controlled  hypertension and chronic lung disease.   Conclusions   Summary  No EKG evidence for ischemia with exercise  Normal LV size and systolic function.  Myocardial perfusion imaging with moderate sized, moderate severity area of  decreased uptake in the anterior, anteroapical and anterolateral walls with  stress with redistribution at rest consistent with ischemia in the territory  of the LAD.  Stress Protocols   Resting ECG  NSR   Resting HR:81 bpm     Resting BP:141/71 mmHg   Pre-stress physical exam: Resting pulse ox 95%.  Stress Protocol:Exercise - Bruce  Peak HR:141 bpm                          HR/BP product:19881  Peak BP:141/71 mmHg                      Max exercise: 7 METS  Predicted HR: 162 bpm  % of predicted HR: 87  Test duration:4 min and 31 sec  Reason for termination:Target heart rate   ECG Findings  Sinus tachycardia, <62mm ST depression   Symptoms  There was stress induced chest pain 7/10, SOB.  Symptoms resolved with sublingual nitroglycerine, and rest.   Complications  Procedure complication was none.   Stress Interpretation  No EKG evidence for ischemia with exercise   Imaging Protocols   - Two Day   Rest  Stress   Isotope:Myoview/Tetrofosmin   Isotope: Myoview/Tetrofosmin  Isotope dose:30.7 mCi         Isotope dose:31.3 mCi  Administration Route:I.V.     Administration Route:I.V.  Date:05/06/2017 00:00         Date:05/05/2017 00:00                                 Technique:      Gated  Imaging Results     Study artifacts     1) Breast    attenuation     Stress ejection    Ejection fraction:66 %    EDV :68 ml    ESV :23 ml    Stroke volume :45 ml    LV mass :113 gr  Medical History  Signatures   ------------------------------------------------------------------  Electronically signed by Vianne Bulls, MD (Interpreting  physician) on 05/06/2017 at 14:52  ------------------------------------------------------------------      The patient was seen and  examined on day of discharge and this discharge summary is in conjunction with any daily progress note from day of discharge.Time Spent on discharge is 1 hour  in the examination, evaluation, counseling and review of medications and discharge plan.      Note that more than 30 minutes was spent in preparing discharge papers, discussing discharge with patient, medication review, etc.       Signed:    Catalina Gravel, MD   05/07/2017      Thank you TERESA M O'BRIEN SLOUGH, MD for the opportunity to be involved in this patient's care. If you have any questions or concerns please feel free to contact me at [place], [facility].

## 2017-05-07 NOTE — Plan of Care (Signed)
Problem: Pain:  Goal: Control of acute pain  Control of acute pain   Outcome: Ongoing  Chest pain. PRN Morphine Q 4 hr. Heparin gtt infusing. Cardiology following. CATH later this afternoon.

## 2017-05-07 NOTE — Progress Notes (Signed)
Patient returned from [facility] lab. Patient drowsy. VSS. Right femoral site- C/D/I. CATH restrictions up at 1630 per CATH lab RN. Patient updated on POC. Will monitor.

## 2017-05-07 NOTE — Progress Notes (Signed)
Data- discharge order received, pt verbalized agreement to discharge, disposition to previous residence, no needs for HHC/DME.     Action- discharge instructions prepared and given to patient, pt verbalized understanding. Medication information packet given r/t NEW and/or CHANGED prescriptions emphasizing name/purpose/side effects, pt verbalized understanding. Discharge instruction summary: Diet- Cardiac, Activity- Post cath restrictions, Primary Care Physician as follows: Acquanetta Sit SLOUGH, MD [phone] f/u appointment in 1-2 weeks, F/U with Cardiology on 6/26, prescription medications called into [facility] (Protonix/ Carafate). Inpatient surgical procedure precautions reviewed: Post CATH restritcions    Response- Pt belongings gathered, IV/Tele removed. Disposition is home (no HHC/DME needs), transported with Sister, taken to lobby via w/c w/ Transport, no complications.

## 2017-05-08 NOTE — Care Coordination-Inpatient (Signed)
[place] Transitions Initial Follow Up Call    Call within 2 business days of discharge: Yes    Patient: Rachael Mays Patient DOB: [birth year]   MRN: [id number]  Reason for Admission: Unstable Angina  Discharge Date: 05/07/17 RARS: Readmission Risk Score: 17    Facility:[facility]  CTC attempted first 24 hour Care transition follow up call, no answer,  left message on  Voicemail  with reason for call and contact information.     Follow Up  Future Appointments  Date Time Provider [place]   06/01/2017 3:00 PM Luiz Iron, APRN - CNP FF Cardio [facility]   02/07/2018 2:00 PM Tiffanie Rita Ohara, APRN - CNP [facility] SLEEP MED [facility]       Dariela Stoker Loura Pardon, RN   Care Transition Coordinator  [phone]

## 2017-05-09 NOTE — Care Coordination-Inpatient (Signed)
[place] Transitions Initial Follow Up Call    Call within 2 business days of discharge: Yes    Patient: Rachael Mays Patient DOB: 16-Nov-1956   MRN: [id number]  Reason for Admission: Unstable Angina  Discharge Date: 05/07/17 RARS: Readmission Risk Score: 17  Facility: [facility]    CTC second attempt to reach patient for 24 hour discharge call . Voice mail left with reason for call and contact information.    Follow Up  Future Appointments  Date Time Provider [place]   06/01/2017 3:00 PM Luiz Iron, APRN - CNP FF Cardio [facility]   02/07/2018 2:00 PM Tiffanie Rita Ohara, APRN - CNP [facility] SLEEP MED [facility]       Regine Christian Loura Pardon, RN

## 2017-05-10 LAB — EKG 12-LEAD
Atrial Rate: 78 {beats}/min
Diagnosis: NORMAL
P Axis: 60 degrees
P-R Interval: 136 ms
Q-T Interval: 400 ms
QRS Duration: 72 ms
QTc Calculation (Bazett): 456 ms
R Axis: -12 degrees
T Axis: 30 degrees
Ventricular Rate: 78 {beats}/min

## 2017-05-10 NOTE — Telephone Encounter (Signed)
[facility] OOT please advise thank you

## 2017-05-10 NOTE — Care Coordination-Inpatient (Addendum)
[place] Transitions Initial Follow Up Call    Call within 2 business days of discharge: Yes    Patient: Rachael Mays Patient DOB: Jan 02, 1956   MRN: [id number]  Reason for Admission: There are no discharge diagnoses documented for the most recent discharge.  Discharge Date: 05/07/17 RARS: Readmission Risk Score: 17     3rd and final attempt at an initial 24 hour call, contact info left on vm. Will notify [facility]    Follow Up  Future Appointments  Date Time Provider [place]   06/01/2017 3:00 PM Luiz Iron, APRN - CNP FF Cardio [facility]   02/07/2018 2:00 PM Tiffanie Rita Ohara, APRN - CNP [facility] SLEEP MED [facility]       Iver Nestle, RN

## 2017-05-10 NOTE — Telephone Encounter (Signed)
Called patient, she has had unchanged pain in her groin since the cath, no bleeding,no flank pain.  Bilateral legs/feet same color, no palor weakness. 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

## 2017-05-10 NOTE — Telephone Encounter (Signed)
Pt calling wants to know is it ok to go back to work since being discharged from the hospital on Thursday. Pt needs a note. Please call to advise,Thank You!

## 2017-05-11 NOTE — Telephone Encounter (Signed)
Pt has been admitted and discharged from [facility] since this note.

## 2017-05-12 ENCOUNTER — Ambulatory Visit
Admit: 2017-05-12 | Discharge: 2017-05-12 | Payer: PRIVATE HEALTH INSURANCE | Attending: Adult Health | Primary: Sports Medicine

## 2017-05-12 DIAGNOSIS — R0789 Other chest pain: Secondary | ICD-10-CM

## 2017-05-12 NOTE — Progress Notes (Signed)
[place]     Outpatient Follow Up Note    CHIEF COMPLAINT / HPI: Hospital Follow Up secondary to [name] record has been reviewed  Hospital Course progressed as follows per discharge summary: 5/30 - 05/07/17:   came to ER with acute onset of L sided CP.  Placed in observation.  SPECT stress was positive.  Patient changed to inpatient status for unstable angina.  Seen by Dr Susy Manor (Cardio).  Underwent LHC that was normal.  Suspect SPECT was abnormal due to breast attenuation.  Started on aggressive GERD regimen.  PCP should refer to Rheum as OP for fibromyalgia vs other autoimmune disease work up.     Rachael Mays is 61 y.o. female who presents today for a routine follow up after a recent hospitalization related to the above mentioned issues. She recalls having had bad CP with arm radiation. Her arms/legs felt weak, she couldn't breath and felt sick to her stomach.  Subjective:   Since the time of discharge, the patient admits their symptoms have improved.  She's feeling better. She woke this morning sick to her stomach and gaggy / dry heaves. She took her Carafate and Protonix and felt better.    She denies significant chest pain. There is no SOB/DOE. The patient is not experiencing palpitations. She's been dizzy since getting up this morning / room spinning. She has this off/on.   These symptoms are improving over the last few days.   With regard to medication therapy the patient has been compliant with prescribed regimen. They have tolerated therapy to date.     Past Medical History:   Diagnosis Date   . Anxiety    . Asthma    . Blood circulation, collateral    . CAD (coronary artery disease)    . CHF (congestive heart failure) ([place])    . Chronic back pain    . Deep vein thrombosis    . Depression    . GERD (gastroesophageal reflux disease)     NO LONGER SINCE LAP   . GERD (gastroesophageal reflux disease) 01/23/2009   . Hx of blood clots    . Hyperlipidemia     hx; resolved with lap band    . Hypertension    . Idiopathic peripheral neuropathy 03/26/2017   . Obesity     hx of; had lap band   . Obstructive sleep apnea (adult) (pediatric) 09/18/2015   . Pulmonary embolism ([place])      Social History:    History   Smoking Status   . Former Smoker   . Packs/day: 0.50   . Years: 40.00   . Types: Cigarettes   . Quit date: 08/07/2012   Smokeless Tobacco   . Former Systems developer   . Quit date: 06/16/2013     Comment: started to smoke at age 74 / only smoked 0.5 p.p.d      Current Medications:  Current Outpatient Prescriptions   Medication Sig Dispense Refill   . pantoprazole (PROTONIX) 40 MG tablet Take 1 tablet by mouth 2 times daily 60 tablet 0   . sucralfate (CARAFATE) 1 GM tablet Take 1 tablet by mouth 4 times daily for 7 days 28 tablet 0   . ALPRAZolam (XANAX) 0.25 MG tablet Take 0.25 mg by mouth 3 times daily as needed for Sleep..     . ranolazine (RANEXA) 500 MG extended release tablet Take 1 tablet by mouth 2 times daily 60 tablet 3   .  metoprolol (LOPRESSOR) 100 MG tablet TAKE 1 AND 1/2 TABLET BY MOUTH TWO TIMES A DAY 270 tablet 1   . lisinopril (PRINIVIL;ZESTRIL) 40 MG tablet TAKE ONE TABLET BY MOUTH ONE TIME A DAY 90 tablet 1   . prasugrel (EFFIENT) 10 MG TABS TAKE 1 TABLET BY MOUTH DAILY 90 tablet 3   . aspirin 81 MG EC tablet Take 1 tablet by mouth daily 30 tablet 11   . fluocinonide (LIDEX) 0.05 % external solution APPLY SPARINGLY TO THE BACK OF THE SCALP DAILY IF NEEDED FOR ITCHING 60 mL 1   . montelukast (SINGULAIR) 10 MG tablet TAKE ONE TABLET BY MOUTH ONE TIME A DAY 90 tablet 1   . spironolactone (ALDACTONE) 25 MG tablet Take 1.5 tablets by mouth daily 90 tablet 3   . traZODone (DESYREL) 100 MG tablet TAKE ONE TABLET BY MOUTH NIGHTLY 90 tablet 3   . venlafaxine (EFFEXOR XR) 150 MG extended release capsule Take 1 capsule by mouth daily 90 capsule 3   . nitroGLYCERIN (NITROSTAT) 0.4 MG SL tablet up to max of 3 total doses. If no relief after 1 dose, call 911. 25 tablet 3   . torsemide (DEMADEX) 20 MG tablet  Take 2 tablets by mouth daily (Patient taking differently: Take 20 mg by mouth daily Takes an extra dose for a wt gain > 3#) 180 tablet 3   . triamcinolone (KENALOG) 0.1 % cream Apply topically 2 times daily.Apply with Q tip in each ear BID for 1 week. 15 g 1   . mupirocin (BACTROBAN) 2 % ointment Apply topically  Daily.Apply with Q tip in each naris at night 15 g 0   . oxyCODONE-acetaminophen (PERCOCET) 5-325 MG per tablet Take 1 tablet by mouth every 6 hours as needed for Pain . (Patient taking differently: Take 1 tablet by mouth every 8 hours as needed for Pain. Marland Kitchen) 40 tablet 0   . albuterol sulfate HFA (PROVENTIL HFA) 108 (90 BASE) MCG/ACT inhaler Inhale 2 puffs into the lungs every 6 hours as needed for Wheezing 3 Inhaler 3   . Multiple Vitamins-Minerals (MULTIVITAMIN PO) Take 1 tablet by mouth daily      . CALCIUM-VITAMIN D PO Take 1 tablet by mouth daily      . cetirizine (ZYRTEC ALLERGY) 10 MG tablet Take 10 mg by mouth daily.       No current facility-administered medications for this visit.      REVIEW OF SYSTEMS:   CONSTITUTIONAL: No major weight gain or loss, fatigue, weakness, night sweats or fever. There's been no change in energy level, sleep pattern, or activity level.     HEENT: No new vision difficulties or ringing in the ears.  RESPIRATORY: No new SOB, PND, orthopnea or cough.   CARDIOVASCULAR: See HPI  GI: No nausea, vomiting, diarrhea, constipation, abdominal pain or changes in bowel habits.  GU: No urinary frequency, urgency, incontinence hematuria or dysuria.  SKIN: No cyanosis or skin lesions.  MUSCULOSKELETAL: No new muscle or joint pain.  NEUROLOGICAL: No syncope or TIA-like symptoms.  PSYCHIATRIC: No anxiety, pain, insomnia or depression    Objective:   PHYSICAL EXAM:       Vitals:    05/12/17 1339 05/12/17 1354   BP: 110/70 100/62   Site: Right Arm    Position: Sitting    Cuff Size: Large Adult    Pulse: 72    Weight: 294 lb 4.8 oz (133.5 kg)    Height: 5\' 6"  (1.676 m)  VITALS:  BP  110/70 (Site: Right Arm, Position: Sitting, Cuff Size: Large Adult)   Pulse 72   Ht 5\' 6"  (1.676 m)   Wt 294 lb 4.8 oz (133.5 kg)   BMI 47.50 kg/m     CONSTITUTIONAL: Cooperative, no apparent distress, and appears well nourished / obesity   NEUROLOGIC:  Awake and orientated to person, place and time.  PSYCH: Calm affect.  SKIN: Warm and dry; Rt groin unremarkable .  HEENT: Sclera non-icteric, normocephalic, neck supple, no elevation of JVP, normal carotid pulses with no bruits and thyroid normal size.  LUNGS:  No increased work of breathing and clear to auscultation, no crackles or wheezing.  CARDIOVASCULAR:  Regular rate 72 and rhythm with no murmurs, gallops, rubs, or abnormal heart sounds, normal PMI. The apical impulses not displaced.                             Heart tones are crisp and normal                                                                                            Cervical veins are not engorged                 JVP less than 8 cm H2O                                                                              The carotid upstroke is normal in amplitude and contour without delay or bruit    ABDOMEN:  Normal bowel sounds, non-distended and non-tender to palpation   EXT: No edema, no calf tenderness. Pulses are present bilaterally.    DATA:    Lab Results   Component Value Date    ALT 41 (H) 05/05/2017    AST 28 05/05/2017    ALKPHOS 89 05/05/2017    BILITOT <0.2 05/05/2017     Lab Results   Component Value Date    CREATININE 1.1 05/07/2017    BUN 17 05/07/2017    NA 142 05/07/2017    K 3.8 05/07/2017    CL 101 05/07/2017    CO2 30 05/07/2017       Lab Results   Component Value Date    WBC 5.8 05/07/2017    HGB 13.0 05/07/2017    HCT 39.1 05/07/2017    MCV 88.0 05/07/2017    PLT 168 05/07/2017     No components found for: CHLPL  Lab Results   Component Value Date    TRIG 114 06/17/2016    TRIG 100 05/21/2016    TRIG 137 05/04/2014     Lab Results   Component Value Date    HDL 43 06/17/2016     HDL 42 05/21/2016    HDL 49  05/04/2014     Lab Results   Component Value Date    LDLCALC 52 06/17/2016    LDLCALC 65 05/21/2016    LDLCALC 132 (H) 05/04/2014     Lab Results   Component Value Date    LABVLDL 23 06/17/2016    LABVLDL 20 05/21/2016    LABVLDL 27 05/04/2014     Radiology Review:  Pertinent images / reports were reviewed as a part of this visit and reveals the following:        Last Stress Test: 05/06/17:  Summary  No EKG evidence for ischemia with exercise  Normal LV size and systolic function.  Myocardial perfusion imaging with moderate sized, moderate severity area of  decreased uptake in the anterior, anteroapical and anterolateral walls with  stress with redistribution at rest consistent with ischemia in the territory  of the LAD.      Last Angiogram: 05/07/17:  Anatomy:   LM-normal  LAD-normal, widely patent stent mid  Cx-normal  OM1- normal  RCA-normal, right dominant  RPDA- normal  LVEF- 60%    Impression:  1.  Normal coronary artery anatomy.  2.  Widely patent mid LAD stent.  3.  Normal LV systolic function.  4.  False positive stress test.  Likely breast artifact      Assessment:      Diagnosis Orders   1. Other chest pain   ~non-cardiac in origin. Has felt better with carafate and protonix    2. Coronary artery disease due to lipid rich plaque   ~stable by cath: patent stent LAD  ~DAPT / BB  Has remained on Ranexa     3. Essential hypertension   ~well controlled         Patient  is stable since hospital discharge.    Plan:  Continue present management    f/u in 3 months       I have addressed the patient's cardiac risk factors and adjusted pharmacologic treatment as needed. In addition, I have reinforced the need for patient directed risk factor modification.    Further evaluation will be based upon the patient's clinical course and testing results.    All questions and concerns were addressed to the patient. Alternatives to  treatment were discussed.     The patient  currently  is not  smoking. The risks related to smoking were reviewed with the patient. Recommend maintaining a smoke-free lifestyle.     Dual Antiplatelet therapy has been recommended / prescribed for this patient. Education conducted on adverse reactions including bleeding was discussed. The patient verbalizes understanding.    Pt is on a BB  Pt is on an ace-i  Pt is not on a statin : intolerant     Saturated fat diet discussed  Exercise program discussed    Thank you for allowing to [place] to participate in the care of ROYALTY FAKHOURI.      [facility]  Documentation of today's visit sent to PCP

## 2017-05-13 NOTE — Care Coordination-Inpatient (Signed)
Ambulatory Care Coordination Note  05/13/2017  CM Risk Score: 11  Gagne Mortality Risk Score:      ACC: Roderic Scarce, RN    Summary Note: Call to pt, reports that she's doing well post-dc. Pt reports that she had a cardiac cath done and per MD- sh no longer has CHF. RN encouraged pt to continue with Demedex and weight monitoring as prescribed. Pt states she has been compliant with monitoring fluid and Na intake. To return to week on Monday. RN encouraged pt to continue to wear her knee-high compression stockings, especially at work. Pt reports that she will call Cardiology for an MD note to allow her to sit while at work. Pt denies any needs at this time. RN notified pt of transition of ACC.        Care Coordination Interventions    Program Enrollment:  Complex Care  Referral from Primary Care Provider:  No  Suggested Interventions and Community Resources  Disease Specific Clinic:  Completed (Comment: CHF at [facility])  Registered Dietician:  Declined  Zone Management Tools:  Completed  Other Services or Interventions:  declined CHF class at this time.          Goals Addressed             Most Recent    . Self Monitoring   On track (05/13/2017)             Daily Weights - I will notify my provider of any increase in weight by 3 or more pounds in 2 days OR 5 or more pounds in a week.    None Recently Recorded    Barriers: none  Plan for overcoming my barriers: N/A  Confidence: 10/10  Anticipated Goal Completion Date: 3 months                Prior to Admission medications    Medication Sig Start Date End Date Taking? Authorizing Provider   pantoprazole (PROTONIX) 40 MG tablet Take 1 tablet by mouth 2 times daily 05/07/17 06/06/17  Catalina Gravel, MD   sucralfate (CARAFATE) 1 GM tablet Take 1 tablet by mouth 4 times daily for 7 days 05/07/17 05/14/17  Catalina Gravel, MD   ALPRAZolam Duanne Moron) 0.25 MG tablet Take 0.25 mg by mouth 3 times daily as needed for Sleep.Marland Kitchen    Historical Provider, MD   ranolazine (RANEXA) 500 MG extended release tablet  Take 1 tablet by mouth 2 times daily 04/29/17   Luiz Iron, APRN - CNP   metoprolol (LOPRESSOR) 100 MG tablet TAKE 1 AND 1/2 TABLET BY MOUTH TWO TIMES A DAY 04/12/17   Harvel Quale, MD   lisinopril (PRINIVIL;ZESTRIL) 40 MG tablet TAKE ONE TABLET BY MOUTH ONE TIME A DAY 04/12/17   Harvel Quale, MD   prasugrel (EFFIENT) 10 MG TABS TAKE 1 TABLET BY MOUTH DAILY 03/29/17   Leodis Binet, MD   aspirin 81 MG EC tablet Take 1 tablet by mouth daily 02/16/17   Harvel Quale, MD   fluocinonide (LIDEX) 0.05 % external solution APPLY SPARINGLY TO THE BACK OF THE SCALP DAILY IF NEEDED FOR ITCHING 02/16/17   Harvel Quale, MD   montelukast (SINGULAIR) 10 MG tablet TAKE ONE TABLET BY MOUTH ONE TIME A DAY 02/16/17   Harvel Quale, MD   spironolactone (ALDACTONE) 25 MG tablet Take 1.5 tablets by mouth daily 02/16/17   Harvel Quale, MD   traZODone (DESYREL) 100  MG tablet TAKE ONE TABLET BY MOUTH NIGHTLY 02/16/17   Harvel Quale, MD   venlafaxine (EFFEXOR XR) 150 MG extended release capsule Take 1 capsule by mouth daily 02/16/17   Harvel Quale, MD   nitroGLYCERIN (NITROSTAT) 0.4 MG SL tablet up to max of 3 total doses. If no relief after 1 dose, call 911. 02/16/17   Harvel Quale, MD   torsemide [facility]) 20 MG tablet Take 2 tablets by mouth daily  Patient taking differently: Take 20 mg by mouth daily Takes an extra dose for a wt gain > 3# 01/11/17   Connye Burkitt, MD   triamcinolone (KENALOG) 0.1 % cream Apply topically 2 times daily.Apply with Q tip in each ear BID for 1 week. 01/04/17   Alvina Chou, MD   mupirocin (BACTROBAN) 2 % ointment Apply topically  Daily.Apply with Q tip in each naris at night 01/04/17   Alvina Chou, MD   oxyCODONE-acetaminophen (PERCOCET) 5-325 MG per tablet Take 1 tablet by mouth every 6 hours as needed for Pain .  Patient taking differently: Take 1 tablet by mouth every 8 hours as needed for Pain. . 04/07/16    Bhumit Patel, MD   albuterol sulfate HFA (PROVENTIL HFA) 108 (90 BASE) MCG/ACT inhaler Inhale 2 puffs into the lungs every 6 hours as needed for Wheezing 12/27/15   Harvel Quale, MD   Multiple Vitamins-Minerals (MULTIVITAMIN PO) Take 1 tablet by mouth daily     Historical Provider, MD   CALCIUM-VITAMIN D PO Take 1 tablet by mouth daily     Historical Provider, MD   cetirizine (ZYRTEC ALLERGY) 10 MG tablet Take 10 mg by mouth daily.    Historical Provider, MD       Future Appointments  Date Time Provider [place]   08/12/2017 1:30 PM Luiz Iron, APRN - CNP FF Cardio [facility]   02/07/2018 2:00 PM Tiffanie Rita Ohara, APRN - CNP FF SLEEP MED [facility]     ,   Congestive Heart Failure Assessment    Are you currently restricting fluids?:  2000cc  Do you understand a low sodium diet?:  Yes  Do you understand how to read food labels?:  Yes  How many restaurant meals do you eat per week?:  3-4  Do you salt your food before tasting it?:  No     No patient-reported symptoms      Symptoms:   None:  Yes      Symptom course:  stable  Patient-reported weight (lb):  294     ,   COPD Assessment       No patient-reported symptoms         Symptoms:          and   General Assessment    Do you have any symptoms that are causing concern?:  No

## 2017-05-13 NOTE — Telephone Encounter (Signed)
I agree with the Care Coordinator's Plan of Care

## 2017-05-17 NOTE — Telephone Encounter (Signed)
She said that if she stands to long her ankles and feet swell. She is asking that we do this for her please.

## 2017-05-17 NOTE — Telephone Encounter (Signed)
Pt says she was cleared to go back to work, but in order for her to be able to sit on stool she needs note for them, she is a Scientist, water quality.

## 2017-05-17 NOTE — Telephone Encounter (Signed)
I spoke with pt and she stated that she would pick up letter. I did not see letter in letters tab. Please write letter for her to pick up tomorrow.

## 2017-05-17 NOTE — Telephone Encounter (Signed)
I re-did the letter. Don't know where to send it. No fax #

## 2017-05-17 NOTE — Telephone Encounter (Signed)
Left message on pts machine to call back    When I spoke with pt earlier she wants to pick up letter

## 2017-05-17 NOTE — Telephone Encounter (Signed)
She should get that from her PCP; not sure why she needs to sit unless she's miserable with her fibromyalgia

## 2017-05-26 NOTE — Telephone Encounter (Signed)
Left a message for pt to find out why we are filling out FMLA and what the start and end dates are.

## 2017-05-26 NOTE — Telephone Encounter (Signed)
Pt unsure. Gave document to NPTS.

## 2017-06-01 ENCOUNTER — Encounter: Attending: Adult Health | Primary: Sports Medicine

## 2017-06-15 NOTE — Care Coordination-Inpatient (Signed)
RNCC attempted to reach patient for follow up call. Message left requesting a return phone call at patients convenience. Will continue to follow.

## 2017-06-17 NOTE — Telephone Encounter (Signed)
Left a message for pt that her disability form is ready to be picked up and there is a $35 charge.

## 2017-06-25 NOTE — Care Coordination-Inpatient (Signed)
.  Ambulatory Care Coordination Note  06/25/2017  CM Risk Score: 11  Gagne Mortality Risk Score:      ACC: Lyanne Co, RN     Spoke with pt. Discussed ZONE tools specific to her chronic illnesses. Explained again how pt can benefit from [facility] (FREE) services. RNCC to F/U with pt x30month. No change in currentl POC,                                                               Care Coordination Interventions    Program Enrollment:  Complex Care  Referral from Primary Care Provider:  No  Suggested Interventions and Community Resources  Disease Specific Clinic:  Completed (Comment: CHF at [facility])  Registered Dietician:  Declined  Zone Management Tools:  Completed  Other Services or Interventions:  declined CHF class at this time.          Goals Addressed     None          Prior to Admission medications    Medication Sig Start Date End Date Taking? Authorizing Provider   pantoprazole (PROTONIX) 40 MG tablet Take 1 tablet by mouth 2 times daily 05/07/17 06/06/17  Catalina Gravel, MD   sucralfate (CARAFATE) 1 GM tablet Take 1 tablet by mouth 4 times daily for 7 days 05/07/17 05/14/17  Catalina Gravel, MD   ALPRAZolam Duanne Moron) 0.25 MG tablet Take 0.25 mg by mouth 3 times daily as needed for Sleep.Marland Kitchen    Historical Provider, MD   ranolazine (RANEXA) 500 MG extended release tablet Take 1 tablet by mouth 2 times daily 04/29/17   Luiz Iron, APRN - CNP   metoprolol (LOPRESSOR) 100 MG tablet TAKE 1 AND 1/2 TABLET BY MOUTH TWO TIMES A DAY 04/12/17   Harvel Quale, MD   lisinopril (PRINIVIL;ZESTRIL) 40 MG tablet TAKE ONE TABLET BY MOUTH ONE TIME A DAY 04/12/17   Harvel Quale, MD   prasugrel (EFFIENT) 10 MG TABS TAKE 1 TABLET BY MOUTH DAILY 03/29/17   Leodis Binet, MD   aspirin 81 MG EC tablet Take 1 tablet by mouth daily 02/16/17   Harvel Quale, MD   fluocinonide (LIDEX) 0.05 % external solution APPLY SPARINGLY TO THE BACK OF THE SCALP DAILY IF NEEDED FOR ITCHING 02/16/17   Harvel Quale, MD   montelukast  (SINGULAIR) 10 MG tablet TAKE ONE TABLET BY MOUTH ONE TIME A DAY 02/16/17   Harvel Quale, MD   spironolactone (ALDACTONE) 25 MG tablet Take 1.5 tablets by mouth daily 02/16/17   Harvel Quale, MD   traZODone (DESYREL) 100 MG tablet TAKE ONE TABLET BY MOUTH NIGHTLY 02/16/17   Harvel Quale, MD   venlafaxine (EFFEXOR XR) 150 MG extended release capsule Take 1 capsule by mouth daily 02/16/17   Harvel Quale, MD   nitroGLYCERIN (NITROSTAT) 0.4 MG SL tablet up to max of 3 total doses. If no relief after 1 dose, call 911. 02/16/17   Harvel Quale, MD   torsemide [facility]) 20 MG tablet Take 2 tablets by mouth daily  Patient taking differently: Take 20 mg by mouth daily Takes an extra dose for a wt gain > 3# 01/11/17   Connye Burkitt, MD  triamcinolone (KENALOG) 0.1 % cream Apply topically 2 times daily.Apply with Q tip in each ear BID for 1 week. 01/04/17   Alvina Chou, MD   mupirocin (BACTROBAN) 2 % ointment Apply topically  Daily.Apply with Q tip in each naris at night 01/04/17   Alvina Chou, MD   oxyCODONE-acetaminophen (PERCOCET) 5-325 MG per tablet Take 1 tablet by mouth every 6 hours as needed for Pain .  Patient taking differently: Take 1 tablet by mouth every 8 hours as needed for Pain. . 04/07/16   Bhumit Patel, MD   albuterol sulfate HFA (PROVENTIL HFA) 108 (90 BASE) MCG/ACT inhaler Inhale 2 puffs into the lungs every 6 hours as needed for Wheezing 12/27/15   Harvel Quale, MD   Multiple Vitamins-Minerals (MULTIVITAMIN PO) Take 1 tablet by mouth daily     Historical Provider, MD   CALCIUM-VITAMIN D PO Take 1 tablet by mouth daily     Historical Provider, MD   cetirizine (ZYRTEC ALLERGY) 10 MG tablet Take 10 mg by mouth daily.    Historical Provider, MD       Future Appointments  Date Time Provider [place]   08/12/2017 1:30 PM Luiz Iron, APRN - CNP FF Cardio [facility]   02/07/2018 2:00 PM Tiffanie Rita Ohara, APRN - CNP FF SLEEP MED [facility]     ,    Diabetes Assessment            and   Congestive Heart Failure Assessment    Are you currently restricting fluids?:  2000cc  Do you understand a low sodium diet?:  Yes  Do you understand how to read food labels?:  Yes  How many restaurant meals do you eat per week?:  3-4  Do you salt your food before tasting it?:  No         Symptoms:

## 2017-07-06 NOTE — Telephone Encounter (Signed)
Medication Refill    Pt would like to know if we have samples that she can pick up until medication get to her in the mail. Please call to adv thank you     When was your last appointment with cardiology?  (if 1year or longer, please schedule an appointment)    Medication needing refilled: ranolazine (RANEXA) 500 MG extended release tablet     Doseage of the medication:    How are you taking this medication (QD, BID, TID, QID, PRN): Take 1 tablet by mouth 2 times daily    Patient want a 30 or 90 day supply called in: 90 day     Which Pharmacy are we sending the medication to: [facility] [phone]    Pharmacy Phone number:    Pharmacy Fax number:

## 2017-07-06 NOTE — Telephone Encounter (Signed)
Gave pt's husband samples    Last ov: 05/12/17  Last labs:   Next appointment:08/12/17

## 2017-07-07 MED ORDER — RANOLAZINE ER 500 MG PO TB12
500 | ORAL_TABLET | Freq: Two times a day (BID) | ORAL | 3 refills | Status: DC
Start: 2017-07-07 — End: 2018-08-10

## 2017-07-30 NOTE — Telephone Encounter (Signed)
Please advise

## 2017-07-30 NOTE — Telephone Encounter (Signed)
Pt requesting script for handicap placard     If ok to do please call and advise when ready for pickup

## 2017-07-30 NOTE — Telephone Encounter (Signed)
Spoke to the pt, with instructions per Tami, CNP. Advised to have the PCP notified.

## 2017-07-30 NOTE — Telephone Encounter (Signed)
Would need to get from PCP

## 2017-07-30 NOTE — Telephone Encounter (Signed)
Pt calling to adv that handicap placard expires in September and would like to know if NPTS can give her a new one. Please call to adv thank you

## 2017-07-31 NOTE — Telephone Encounter (Signed)
What for?

## 2017-08-02 ENCOUNTER — Inpatient Hospital Stay: Attending: Family Medicine | Primary: Sports Medicine

## 2017-08-02 ENCOUNTER — Encounter

## 2017-08-02 ENCOUNTER — Encounter: Admit: 2017-08-02 | Primary: Sports Medicine

## 2017-08-02 ENCOUNTER — Ambulatory Visit
Admit: 2017-08-02 | Discharge: 2017-08-02 | Payer: PRIVATE HEALTH INSURANCE | Attending: Family Medicine | Primary: Sports Medicine

## 2017-08-02 DIAGNOSIS — S93492A Sprain of other ligament of left ankle, initial encounter: Secondary | ICD-10-CM

## 2017-08-02 NOTE — Telephone Encounter (Signed)
Okay 

## 2017-08-02 NOTE — Progress Notes (Signed)
Subjective:      Patient ID: Rachael Mays is a 61 y.o. female.  CC: Patient presents for acute medical problem-possible fracture of ankle . Medical assistant notes reviewed.    HPI patient is here due to thinking her ankle is possibly broken. Patient twisted her ankle on Saturday. Area is slightly bruised and swollen. Patient is able to slight pressure on her ankle.  Difficulty bearing weight to the ankle    Review of Systems     Allergies   Allergen Reactions   . Morphine Itching   . Seasonal    . Talwin [Pentazocine] Other (See Comments)     dizzy       Objective:   Physical Exam   Constitutional: She appears well-developed and well-nourished. No distress.   Musculoskeletal:        Left ankle: She exhibits decreased range of motion (secondary to pain), swelling and deformity. Tenderness. AITFL and CF ligament tenderness found. No lateral malleolus and no medial malleolus tenderness found. Achilles tendon normal.   Neurological: She is alert.       Assessment:      Rachael Mays was seen today for other.    Diagnoses and all orders for this visit:    Sprain of anterior talofibular ligament of left ankle, initial encounter            Plan:      X-ray examination  Rice therapy  Informational handout provided  RTC PRN

## 2017-08-02 NOTE — Patient Instructions (Signed)
Patient Education        Ankle Sprain: Care Instructions  Your Care Instructions    An ankle sprain can happen when you twist your ankle. The ligaments that support the ankle can get stretched and torn. Often the ankle is swollen and painful.  Ankle sprains may take from several weeks to several months to heal. Usually, the more pain and swelling you have, the more severe your ankle sprain is and the longer it will take to heal. You can heal faster and regain strength in your ankle with good home treatment.  It is very important to give your ankle time to heal completely, so that you do not easily hurt your ankle again.  Follow-up care is a key part of your treatment and safety. Be sure to make and go to all appointments, and call your doctor if you are having problems. It's also a good idea to know your test results and keep a list of the medicines you take.  How can you care for yourself at home?   Prop up your foot on pillows as much as possible for the next 3 days. Try to keep your ankle above the level of your heart. This will help reduce the swelling.   Follow your doctor's directions for wearing a splint or elastic bandage. Wrapping the ankle may help reduce or prevent swelling.   Your doctor may give you a splint, a brace, an air stirrup, or another form of ankle support to protect your ankle until it is healed. Wear it as directed while your ankle is healing. Do not remove it unless your doctor tells you to. After your ankle has healed, ask your doctor whether you should wear the brace when you exercise.   Put ice or cold packs on your injured ankle for 10 to 20 minutes at a time. Try to do this every 1 to 2 hours for the next 3 days (when you are awake) or until the swelling goes down. Put a thin cloth between the ice and your skin.   You may need to use crutches until you can walk without pain. If you do use crutches, try to bear some weight on your injured ankle if you can do so without pain. This  helps the ankle heal.   Take pain medicines exactly as directed.   If the doctor gave you a prescription medicine for pain, take it as prescribed.   If you are not taking a prescription pain medicine, ask your doctor if you can take an over-the-counter medicine.   If you have been given ankle exercises to do at home, do them exactly as instructed. These can promote healing and help prevent lasting weakness.  When should you call for help?  Call your doctor now or seek immediate medical care if:    Your pain is getting worse.     Your swelling is getting worse.     Your splint feels too tight or you are unable to loosen it.   Watch closely for changes in your health, and be sure to contact your doctor if:    You are not getting better after 1 week.   Where can you learn more?  Go to [email or web address] and sign in to your MyChart account. Enter [phone] in the Search Health Information box to learn more about "Ankle Sprain: Care Instructions."     If you do not have an account, please click on the "Sign Up Now" link.  Current as  of: November 04, 2016  Content Version: 11.7   2006-2018 [employer]. Care instructions adapted under license by [facility]. If you have questions about a medical condition or this instruction, always ask your healthcare professional. [employer] disclaims any warranty or liability for your use of this information.

## 2017-08-02 NOTE — Telephone Encounter (Signed)
Patient states that it is for her congestive heart failure.  Patient states that she gets weak, no leg strength, and shortness of breath.  Please advise

## 2017-08-03 NOTE — Telephone Encounter (Signed)
Left message stating placard is ready for pick up.

## 2017-08-03 NOTE — Care Coordination-Inpatient (Signed)
RNCC attempted to reach patient for follow up call. HIPAA compliant message left requesting a return phone call at patients convenience. Will continue to follow.

## 2017-08-04 ENCOUNTER — Encounter

## 2017-08-05 NOTE — Telephone Encounter (Signed)
Medication:   Requested Prescriptions     Pending Prescriptions Disp Refills   . ALPRAZolam (XANAX) 0.25 MG tablet [Pharmacy Med Name: ALPRAZolam 0.25 MG TABLET] 60 tablet 0     Sig: TAKE ONE TABLET BY MOUTH TWICE A DAY AS NEEDED FOR ANXIETY      Last Filled:  01/27/2017    Patient Phone Number: [phone] (home)     Last appt: 08/02/2017   Next appt: Visit date not found    Last OARRS:   RX Monitoring 02/11/2017   Attestation The Prescription Monitoring Report for this patient was reviewed today.   Documentation Possible medication side effects, risk of tolerance/dependence & alternative treatments discussed.;No signs of potential drug abuse or diversion identified.   Medication Contracts Medication contract signed today.       Preferred Pharmacy:   [facility] [address] [place] - [address] - P [phone] - F [phone]  [address]  [place] [address]  Phone: [phone] Fax: [phone]

## 2017-08-09 ENCOUNTER — Encounter

## 2017-08-12 ENCOUNTER — Encounter

## 2017-08-12 ENCOUNTER — Encounter: Attending: Adult Health | Primary: Sports Medicine

## 2017-08-12 NOTE — Telephone Encounter (Signed)
Pt called office and has scheduled appt for 9/20 w/Dr. Murrell Converse.     Pt is asking for enough meds to make it through to her appt on 9/20.     Please advise.

## 2017-08-12 NOTE — Telephone Encounter (Signed)
Message sent to [facility]

## 2017-08-12 NOTE — Telephone Encounter (Signed)
Medication:   Requested Prescriptions     Pending Prescriptions Disp Refills   . ALPRAZolam (XANAX) 0.25 MG tablet [Pharmacy Med Name: ALPRAZolam 0.25 MG TABLET] 60 tablet 1     Sig: TAKE ONE TABLET BY MOUTH TWICE A DAY AS NEEDED FOR ANXIETY      Last Filled:  01/27/17    Patient Phone Number: [phone] (home)     Last appt: 08/02/2017   Next appt: Visit date not found    Last OARRS:   RX Monitoring 02/11/2017   Attestation The Prescription Monitoring Report for this patient was reviewed today.   Documentation Possible medication side effects, risk of tolerance/dependence & alternative treatments discussed.;No signs of potential drug abuse or diversion identified.   Medication Contracts Medication contract signed today.       Preferred Pharmacy:   [facility] [address], [place] [phone] Wanda Plump [phone]  [address]  [place] [address]  Phone: [phone] Fax: [place], [place] - [place] [phone] Wanda Plump [phone]  [place]  [place] [address]  Phone: [phone] Fax: [phone]    [place], [place] - [place] [phone] Wanda Plump [phone]  [place]  [address]  [place] [address]  Phone: [phone] Fax: [phone]

## 2017-08-12 NOTE — Telephone Encounter (Signed)
error 

## 2017-08-13 MED ORDER — MONTELUKAST SODIUM 10 MG PO TABS
10 MG | ORAL_TABLET | ORAL | 0 refills | Status: DC
Start: 2017-08-13 — End: 2017-11-14

## 2017-08-13 MED ORDER — OMEPRAZOLE 40 MG PO CPDR
40 MG | ORAL_CAPSULE | ORAL | 0 refills | Status: DC
Start: 2017-08-13 — End: 2017-11-14

## 2017-08-13 NOTE — Telephone Encounter (Signed)
Patient advised of all information.

## 2017-08-13 NOTE — Telephone Encounter (Signed)
Ankle sprains take 6 weeks or longer to completely heal.  If she's having that much difficulty still that I would recommend an air stirrup or ankle brace to be worn all the time except when sleeping

## 2017-08-13 NOTE — Telephone Encounter (Signed)
Please advise

## 2017-08-13 NOTE — Telephone Encounter (Signed)
Pt was seen on 8/27 by Dr. Gracy Bruins for her ankle. She states that it has never gotten any better. She was out today, and it's bigger than it was in the past, and it hurts more than before. She has been elevating it most of the time.     Please advise.

## 2017-08-14 NOTE — Telephone Encounter (Signed)
Ok for this?  Please advise

## 2017-08-14 NOTE — Telephone Encounter (Signed)
No.  Can't fill as has not been in for this.  Needs visits q 3 months.  In meantime, can have hydoxyzine 50mg  tid prn anxiety.  #90.

## 2017-08-14 NOTE — Telephone Encounter (Signed)
Left message for patient to call back

## 2017-08-15 ENCOUNTER — Encounter

## 2017-08-18 MED ORDER — HYDROXYZINE HCL 50 MG PO TABS
50 MG | ORAL_TABLET | Freq: Three times a day (TID) | ORAL | 0 refills | Status: AC | PRN
Start: 2017-08-18 — End: 2017-08-27

## 2017-08-18 NOTE — Telephone Encounter (Signed)
Patient notified, only wanted a supply to last until her appointment on 08/26/17 so she can then refill her xanax.

## 2017-08-18 NOTE — Addendum Note (Signed)
Addended by: Magdalen Spatz on: 08/18/2017 03:48 PM     Modules accepted: Orders

## 2017-08-26 ENCOUNTER — Ambulatory Visit
Admit: 2017-08-26 | Discharge: 2017-08-26 | Payer: PRIVATE HEALTH INSURANCE | Attending: Family Medicine | Primary: Sports Medicine

## 2017-08-26 DIAGNOSIS — S93402D Sprain of unspecified ligament of left ankle, subsequent encounter: Secondary | ICD-10-CM

## 2017-08-26 MED ORDER — ZOSTER VAC RECOMB ADJUVANTED 50 MCG/0.5ML IM SUSR
50 MCG/0.5ML | INTRAMUSCULAR | 0 refills | Status: DC
Start: 2017-08-26 — End: 2019-09-15

## 2017-08-26 MED ORDER — ALPRAZOLAM 0.25 MG PO TABS
0.25 | ORAL_TABLET | Freq: Three times a day (TID) | ORAL | 0 refills | Status: AC | PRN
Start: 2017-08-26 — End: 2018-08-26

## 2017-08-26 NOTE — Progress Notes (Signed)
Subjective:      Patient ID: Rachael Mays is a 61 y.o. female.    HPI  Going through divorce.  Mood overall good.  Has a couple of interests online.  Has met up with one of these.      Hurt left ankle about a month ago.  Foot inverted off of sandals.  Ended up with xray that showed soft tissue swelling.  Left ankle still more swollen than the right.  Getting a litter better with time.    Vertigo over the last couple of weeks.  Worse if turning head quickly.      On trazodone for insomnia.  Taking 75m nightly.      Would like a reill for xanax.  Taking this rarely despite script being written for bid prn use.  Review of Systems   Constitutional: Negative for chills and fever.   Respiratory: Positive for shortness of breath (mild, baseline). Negative for cough.    Cardiovascular: Negative for chest pain and palpitations.   Musculoskeletal: Positive for arthralgias (right ankle).       Patient Active Problem List   Diagnosis   . Back pain   . Deep vein thrombosis ([place]   . Depression   . Pulmonary embolism ([facility]   . Nausea & vomiting   . Abdominal  pain, other specified site   . Morbid obesity with BMI of 40.0-44.9, adult ([place]   . Status post gastric banding   . Vertigo   . Dizziness   . Tinnitus, subjective   . Other complications of gastric band procedure   . Essential hypertension   . Hyperglycemia   . Atypical chest pain   . 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 Adrenal mass, left ([place]   . Adrenal nodule ([place]   . Obstructive sleep apnea   . Dyspnea and respiratory abnormalities   . Abnormal stress test   . Shortness of breath   . Chronic diastolic congestive heart failure ([facility]   . Coronary artery disease due to lipid rich plaque   . Pulmonary hypertension   . 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 Chest pain   . Acute actinic otitis externa of both ears   . Anxiety and depression   . Bradycardia   . Dyslipidemia   . Chronic eczematous otitis externa of both ears    . Mucositis   . Sprain of radiocarpal joint of right wrist   . Idiopathic peripheral neuropathy   . Chest pain in adult   . Hypoxia   . Chronic pain syndrome   . Unstable angina ([facility]       Outpatient Prescriptions Marked as Taking for the 08/26/17 encounter (Office Visit) with THarvel Quale MD   Medication Sig Dispense Refill   . hydrOXYzine (ATARAX) 50 MG tablet Take 1 tablet by mouth 3 times daily as needed for Anxiety 27 tablet 0   . montelukast (SINGULAIR) 10 MG tablet TAKE ONE TABLET BY MOUTH ONE TIME A DAY 90 tablet 0   . omeprazole (PRILOSEC) 40 MG delayed release capsule TAKE 1 CAPSULE BY MOUTH ONE TIME DAILY 90 capsule 0   . ranolazine (RANEXA) 500 MG extended release tablet Take 1 tablet by mouth 2 times daily 180 tablet 3   . pantoprazole (PROTONIX) 40 MG tablet Take 1 tablet by mouth 2 times daily 60 tablet 0   . ALPRAZolam (XANAX) 0.25 MG tablet Take 0.25 mg by  mouth 3 times daily as needed for Sleep..     . metoprolol (LOPRESSOR) 100 MG tablet TAKE 1 AND 1/2 TABLET BY MOUTH TWO TIMES A DAY 270 tablet 1   . lisinopril (PRINIVIL;ZESTRIL) 40 MG tablet TAKE ONE TABLET BY MOUTH ONE TIME A DAY 90 tablet 1   . prasugrel (EFFIENT) 10 MG TABS TAKE 1 TABLET BY MOUTH DAILY 90 tablet 3   . aspirin 81 MG EC tablet Take 1 tablet by mouth daily 30 tablet 11   . fluocinonide (LIDEX) 0.05 % external solution APPLY SPARINGLY TO THE BACK OF THE SCALP DAILY IF NEEDED FOR ITCHING 60 mL 1   . spironolactone (ALDACTONE) 25 MG tablet Take 1.5 tablets by mouth daily 90 tablet 3   . traZODone (DESYREL) 100 MG tablet TAKE ONE TABLET BY MOUTH NIGHTLY 90 tablet 3   . venlafaxine (EFFEXOR XR) 150 MG extended release capsule Take 1 capsule by mouth daily 90 capsule 3   . torsemide (DEMADEX) 20 MG tablet Take 2 tablets by mouth daily (Patient taking differently: Take 20 mg by mouth daily Takes an extra dose for a wt gain > 3#) 180 tablet 3   . triamcinolone (KENALOG) 0.1 % cream Apply topically 2 times daily.Apply with Q  tip in each ear BID for 1 week. 15 g 1   . mupirocin (BACTROBAN) 2 % ointment Apply topically  Daily.Apply with Q tip in each naris at night 15 g 0   . oxyCODONE-acetaminophen (PERCOCET) 5-325 MG per tablet Take 1 tablet by mouth every 6 hours as needed for Pain . (Patient taking differently: Take 1 tablet by mouth every 8 hours as needed for Pain. Marland Kitchen) 40 tablet 0   . albuterol sulfate HFA (PROVENTIL HFA) 108 (90 BASE) MCG/ACT inhaler Inhale 2 puffs into the lungs every 6 hours as needed for Wheezing 3 Inhaler 3   . Multiple Vitamins-Minerals (MULTIVITAMIN PO) Take 1 tablet by mouth daily      . CALCIUM-VITAMIN D PO Take 1 tablet by mouth daily      . cetirizine (ZYRTEC ALLERGY) 10 MG tablet Take 10 mg by mouth daily.         Allergies   Allergen Reactions   . Morphine Itching   . Seasonal    . Talwin [Pentazocine] Other (See Comments)     dizzy       Social History   Substance Use Topics   . Smoking status: Former Smoker     Packs/day: 0.50     Years: 40.00     Types: Cigarettes     Quit date: 08/07/2012   . Smokeless tobacco: Former Systems developer     Quit date: 06/16/2013      Comment: started to smoke at age 61 / only smoked 0.5 p.p.d    . Alcohol use No       Objective:   BP 120/72 (Site: Left Upper Arm, Position: Sitting, Cuff Size: Large Adult)   Pulse 65   Wt 298 lb 12.8 oz (135.5 kg)   SpO2 95%   BMI 48.23 kg/m     Physical Exam   Constitutional: She is oriented to person, place, and time. She appears well-developed and well-nourished. No distress.   Cardiovascular: Normal rate, regular rhythm and normal heart sounds.    No murmur heard.  Left lower extremity with 1+ edema, right lower extremity without edema   Pulmonary/Chest: Effort normal and breath sounds normal. She has no wheezes. She has no rales.  Neurological: She is alert and oriented to person, place, and time.   Psychiatric: She has a normal mood and affect. Her behavior is normal.       Assessment:      Diagnosis Orders   1. Sprain of left ankle,  unspecified ligament, subsequent encounter     2. Need for vaccination  zoster recombinant adjuvanted vaccine (SHINGRIX) 50 MCG SUSR injection    INFLUENZA, QUADV, RECOMBINANT, 18 YRS AND OLDER, IM, PF, PREFILL SYR OR SDV, 0.5ML (FLUBLOK QUADV, PF)   3. Breast cancer screening  MAM DIGITAL SCREEN W OR WO CAD BILATERAL   4. Anxiety  ALPRAZolam (XANAX) 0.25 MG tablet       Plan:   encouraged internet safety.   Ankle likely needs more time, rest, and eventually stretching.  Shingrix Rx provided.  Influenza vaccine today.  Mammogram ordered.  Ordered xanax 0.110m for prn use.  This rx should last her quite a while.  Discussed risks of xanax use long term.  We would like to avoid this.  Xanax #30 should cover her for a year.  Stop zyrtec and trazodone to see if this is causing vertigo.    Controlled Substances Monitoring:     RX Monitoring 08/29/2017   Attestation The Prescription Monitoring Report for this patient was reviewed today.   Documentation Possible medication side effects, risk of tolerance/dependence & alternative treatments discussed.;No signs of potential drug abuse or diversion identified.   Medication Contracts -

## 2017-08-26 NOTE — Progress Notes (Signed)
Vaccine Information Sheet, "Influenza - Inactivated"  given to Rachael Mays, or parent/legal guardian of  Rachael Mays and verbalized understanding.    Patient responses:    Have you ever had a reaction to a flu vaccine? No  Are you able to eat eggs without adverse effects?  Yes  Do you have any current illness?  No  Have you ever had Guillian Barre Syndrome?  No    Flu vaccine given per order. Please see immunization tab.    Immunization(s) given during visit:     Immunizations     Name Date Dose Route    Influenza, Quadv, Recombinant, IM PF (Flublok 18 yrs and older) 08/26/2017 0.5 mL Intramuscular    Site: Deltoid- Left    Lot: [id number]    NDC: [phone]

## 2017-08-26 NOTE — Patient Instructions (Signed)
Dr. Barnet Pall  [phone]

## 2017-08-30 MED ORDER — METOPROLOL TARTRATE 100 MG PO TABS
100 MG | ORAL_TABLET | ORAL | 1 refills | Status: DC
Start: 2017-08-30 — End: 2017-09-30

## 2017-09-06 NOTE — Care Coordination-Inpatient (Signed)
RNCC attempted to reach patient for follow up call. HIPAA compliant message left requesting a return phone call at patients convenience. Will continue to follow.

## 2017-09-27 NOTE — Telephone Encounter (Signed)
Patient has been informed.

## 2017-09-27 NOTE — Telephone Encounter (Signed)
Sure

## 2017-09-27 NOTE — Telephone Encounter (Signed)
Same day ok?  Please advise

## 2017-09-27 NOTE — Telephone Encounter (Signed)
Pt Called in looking to get a preop for eye surgery . Dr. Murrell Converse doesn't have the availability, but pt would like to get fit in . Pls advise ,thanks !      Apt notes:preop , 11/6,eye surgery , [facility]          Best contact :[phone]

## 2017-09-29 ENCOUNTER — Inpatient Hospital Stay: Admit: 2017-09-29 | Payer: PRIVATE HEALTH INSURANCE | Primary: Sports Medicine

## 2017-09-29 DIAGNOSIS — Z1231 Encounter for screening mammogram for malignant neoplasm of breast: Secondary | ICD-10-CM

## 2017-09-29 NOTE — Progress Notes (Signed)
Left message to call back.

## 2017-09-30 ENCOUNTER — Ambulatory Visit
Admit: 2017-09-30 | Discharge: 2017-09-30 | Payer: PRIVATE HEALTH INSURANCE | Attending: Family Medicine | Primary: Sports Medicine

## 2017-09-30 DIAGNOSIS — Z01818 Encounter for other preprocedural examination: Secondary | ICD-10-CM

## 2017-09-30 MED ORDER — GABAPENTIN 100 MG PO CAPS
100 MG | ORAL_CAPSULE | Freq: Every evening | ORAL | 3 refills | Status: DC
Start: 2017-09-30 — End: 2017-12-30

## 2017-09-30 NOTE — Progress Notes (Signed)
Preoperative Consultation    Rachael Mays  Date of Birth:  Oct 01, 1956    This patient presents to the office today for a preoperative consultation at the request of surgeon, Dr. Ephraim Hamburger, who plans on performing Left eye phacoemulsification with IOL implantation on October 12, 2017 at [facility].      He says a history of chronic diastolic heart failure, coronary artery disease, and pulmonary hypertension. She has a drug-eluting stent to the left LAD. She denies chest pain and shortness of breath. She is on Ranexa, metoprolol, lisinopril, aspirin, and Effient. She is also on Demadex.    Patient reports pain in her bilateral feet. This is most bothersome at night when he keeps her weight. She seems to do okay throughout the day. She had a TSH and B12 earlier this year which were normal. She is wondering if she can try a medication to help with this.    Patient is a history of anxiety and depression. She is currently well-controlled with these. She takes Effexor 150 mg daily. She also takes Xanax 0.25 mg 3 times a day as needed.    Planned anesthesia:  Local and IV sedation   Known anesthesia problems:  None   Bleeding risk:  No recent or remote history of abnormal bleeding  Personal or FH of DVT/PE:  Yes - 1980s PE on OCP; 2002 DVT after cervical spine surgery    Patient Active Problem List   Diagnosis   . Back pain   . Deep vein thrombosis ([place])   . Depression   . Pulmonary embolism ([place])   . Nausea & vomiting   . Abdominal  pain, other specified site   . Morbid obesity with BMI of 40.0-44.9, adult ([place])   . Status post gastric banding   . Vertigo   . Dizziness   . Tinnitus, subjective   . Other complications of gastric band procedure   . Essential hypertension   . Hyperglycemia   . Atypical chest pain   . 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 Adrenal mass, left ([place])   . Adrenal nodule ([place])   . Obstructive sleep apnea   . Dyspnea and respiratory abnormalities   . Abnormal stress test   . Shortness of breath    . Chronic diastolic congestive heart failure ([place])   . Coronary artery disease due to lipid rich plaque   . Pulmonary hypertension   . 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 Chest pain   . Acute actinic otitis externa of both ears   . Anxiety and depression   . Bradycardia   . Dyslipidemia   . Chronic eczematous otitis externa of both ears   . Mucositis   . Sprain of radiocarpal joint of right wrist   . Idiopathic peripheral neuropathy   . Chest pain in adult   . Hypoxia   . Chronic pain syndrome   . Unstable angina [facility])     Past Surgical History:   Procedure Laterality Date   . BACK SURGERY      neck  plates   . BREAST SURGERY      left lumpectomy   . CESAREAN SECTION     . CHOLECYSTECTOMY     . COLONOSCOPY     . COLONOSCOPY  04/08/10   . CORONARY ANGIOPLASTY     . DIAGNOSTIC CARDIAC CATH LAB PROCEDURE     . DILATATION, ESOPHAGUS     .  ENDOSCOPY, COLON, DIAGNOSTIC     . HYSTERECTOMY     . LAP BAND  05/01/08    Dr. Nicole Cella   . OTHER SURGICAL HISTORY      [place] Filter: curently in place   . OTHER SURGICAL HISTORY  07/05/13    lap band removal   . PARTIAL HYSTERECTOMY     . SHOULDER SURGERY     . UPPER GASTROINTESTINAL ENDOSCOPY  05/19/13   . UPPER GASTROINTESTINAL ENDOSCOPY  07/21/13    ESOPHAGEAL STENT PLACEMENT   . UPPER GASTROINTESTINAL ENDOSCOPY N/A 07/31/2014    Esophagogastroduodenoscopy with esophageal balloon dilation   . VASCULAR SURGERY  04/2015    Lucendia Herrlich, celiac artery angiogram, normal abd arteries       Allergies   Allergen Reactions   . Morphine Itching   . Seasonal    . Talwin [Pentazocine] Other (See Comments)     dizzy     Outpatient Prescriptions Marked as Taking for the 09/30/17 encounter (Office Visit) with Harvel Quale, MD   Medication Sig Dispense Refill   . ALPRAZolam (XANAX) 0.25 MG tablet Take 1 tablet by mouth 3 times daily as needed for Sleep.. 30 tablet 0   . montelukast (SINGULAIR) 10 MG tablet TAKE ONE TABLET BY MOUTH ONE  TIME A DAY 90 tablet 0   . omeprazole (PRILOSEC) 40 MG delayed release capsule TAKE 1 CAPSULE BY MOUTH ONE TIME DAILY 90 capsule 0   . ranolazine (RANEXA) 500 MG extended release tablet Take 1 tablet by mouth 2 times daily 180 tablet 3   . metoprolol (LOPRESSOR) 100 MG tablet TAKE 1 AND 1/2 TABLET BY MOUTH TWO TIMES A DAY 270 tablet 1   . lisinopril (PRINIVIL;ZESTRIL) 40 MG tablet TAKE ONE TABLET BY MOUTH ONE TIME A DAY 90 tablet 1   . prasugrel (EFFIENT) 10 MG TABS TAKE 1 TABLET BY MOUTH DAILY 90 tablet 3   . aspirin 81 MG EC tablet Take 1 tablet by mouth daily 30 tablet 11   . fluocinonide (LIDEX) 0.05 % external solution APPLY SPARINGLY TO THE BACK OF THE SCALP DAILY IF NEEDED FOR ITCHING 60 mL 1   . spironolactone (ALDACTONE) 25 MG tablet Take 1.5 tablets by mouth daily 90 tablet 3   . traZODone (DESYREL) 100 MG tablet TAKE ONE TABLET BY MOUTH NIGHTLY 90 tablet 3   . venlafaxine (EFFEXOR XR) 150 MG extended release capsule Take 1 capsule by mouth daily 90 capsule 3   . torsemide (DEMADEX) 20 MG tablet Take 2 tablets by mouth daily (Patient taking differently: Take 20 mg by mouth daily Takes an extra dose for a wt gain > 3#) 180 tablet 3   . triamcinolone (KENALOG) 0.1 % cream Apply topically 2 times daily.Apply with Q tip in each ear BID for 1 week. 15 g 1   . mupirocin (BACTROBAN) 2 % ointment Apply topically  Daily.Apply with Q tip in each naris at night 15 g 0   . oxyCODONE-acetaminophen (PERCOCET) 5-325 MG per tablet Take 1 tablet by mouth every 6 hours as needed for Pain . (Patient taking differently: Take 1 tablet by mouth every 8 hours as needed for Pain. Marland Kitchen) 40 tablet 0   . albuterol sulfate HFA (PROVENTIL HFA) 108 (90 BASE) MCG/ACT inhaler Inhale 2 puffs into the lungs every 6 hours as needed for Wheezing 3 Inhaler 3   . Multiple Vitamins-Minerals (MULTIVITAMIN PO) Take 1 tablet by mouth daily      . CALCIUM-VITAMIN D  PO Take 1 tablet by mouth daily      . cetirizine (ZYRTEC ALLERGY) 10 MG tablet Take 10  mg by mouth daily.         Social History   Substance Use Topics   . Smoking status: Former Smoker     Packs/day: 0.50     Years: 40.00     Types: Cigarettes     Quit date: 08/07/2012   . Smokeless tobacco: Former Systems developer     Quit date: 06/16/2013      Comment: started to smoke at age 59 / only smoked 0.5 p.p.d    . Alcohol use No     Family History   Problem Relation Age of Onset   . Arthritis Mother    . Cancer Mother    . Depression Mother    . High Blood Pressure Mother    . Heart Disease Mother 19        MI    . Ovarian Cancer Mother 2   . Cancer Father 55        colon   . Heart Disease Maternal Grandmother    . Early Death Paternal Grandmother    . Heart Disease Paternal Grandfather    . Diabetes Other    . High Blood Pressure Other    . Obesity Other    . Other Sister         OSA   . Other Brother         OSA   . Other Brother         OSA   . Other Daughter         OSA       Review of Systems  A comprehensive review of systems was negative except for what was noted in the HPI.     Physical Exam   BP 92/62 (Site: Left Upper Arm, Position: Sitting, Cuff Size: Large Adult)   Pulse 56   Temp 97.8 F (36.6 C) (Tympanic)   Wt 298 lb 9.6 oz (135.4 kg)   SpO2 96%   BMI 48.20 kg/m   Weight: 298 lb 9.6 oz (135.4 kg)   Constitutional: She is oriented to person, place, and time. She appears well-developed and well-nourished. No distress.   HENT:   Head: Normocephalic and atraumatic.   Mouth/Throat: Uvula is midline, oropharynx is clear and moist and mucous membranes are normal.   Eyes: Conjunctivae and EOM are normal. Pupils are equal, round, and reactive to light.   Neck: Trachea normal and normal range of motion. Neck supple. Carotid bruit is not present. No mass and no thyromegaly present.   Cardiovascular: Normal rate, regular rhythm, normal heart sounds and intact distal pulses.  Exam reveals no gallop and no friction rub.  No murmur heard.  Pulmonary/Chest: Effort normal and breath sounds normal. No respiratory  distress. She has no wheezes. She has no rales.   Abdominal: Soft. Normal aorta and bowel sounds are normal. She exhibits no distension and no mass. There is no hepatosplenomegaly. No tenderness.   Musculoskeletal: She exhibits no edema and no tenderness.   Neurological: She is alert and oriented to person, place, and time. She has normal strength. No cranial nerve deficit or sensory deficit. Coordination and gait normal.   Skin: Skin is warm and dry. No rash noted. No erythema.      Assessment:       There are no diagnoses linked to this encounter.  61 y.o. patient  Diagnosis Orders   1. Preop examination     2. Chronic diastolic congestive heart failure (HCC)     3. Other pulmonary embolism without acute cor pulmonale, unspecified chronicity ([place])     4. Deep vein thrombosis (DVT) of other vein of lower extremity, unspecified chronicity, unspecified laterality (HCC)     5. Morbid obesity with BMI of 40.0-44.9, adult ([place])     6. Pulmonary hypertension     7. Status post insertion of drug-eluting stent into left anterior descending (LAD) artery     8. Coronary artery disease due to lipid rich plaque     9. Essential hypertension     10. Depression, unspecified depression type     11. Hyperglycemia     12. Obstructive sleep apnea     13. Anxiety and depression          Plan:     1. Preoperative workup as follows:  none  2. Change in medication regimen before surgery:  May begin gabapentin 200 mg nightly for bilateral lower extremity pain.  3. No contraindications to planned surgery    Controlled Substances Monitoring:     RX Monitoring 09/30/2017   Attestation The Prescription Monitoring Report for this patient was reviewed today.   Documentation Possible medication side effects, risk of tolerance/dependence & alternative treatments discussed.;No signs of potential drug abuse or diversion identified.   Medication Contracts -

## 2017-09-30 NOTE — Telephone Encounter (Signed)
Name: Rachael Mays    DOB: [birth year]  Payor: [employer] /  /  / Note: This is the primary coverage, but no account was found for this location or the patient's primary location.  Referring provider: SELF  PCP: SLOUGH, TESS  How did you hear about the practice?: [facility]  Where is the pain? BACK  Have you seen Dr. Deloria Lair in the past? NO  Are you interested in trying injections to help with the pain?: N  Previous Pain Management?: Y  If yes, physican and when?: [facility]  Still treating or no longer with that provider? If not, why?: NO DON'T TAKE HER INS ANYMORE  Current Pain medications: PC 5-325 TID  MRI: Y [facility]  CT: Y  [place]  OTHER: NO  Have you previously had an injection for the area to be treated?: NO  If yes, what was the date, name of procedure and percentage of relief?: NA  COMMENTS NONE

## 2017-10-15 ENCOUNTER — Ambulatory Visit
Admit: 2017-10-15 | Discharge: 2017-10-15 | Payer: PRIVATE HEALTH INSURANCE | Attending: Interventional Pain Medicine | Primary: Sports Medicine

## 2017-10-15 DIAGNOSIS — M5134 Other intervertebral disc degeneration, thoracic region: Secondary | ICD-10-CM

## 2017-10-15 MED ORDER — OXYCODONE-ACETAMINOPHEN 5-325 MG PO TABS
5-325 MG | ORAL_TABLET | Freq: Three times a day (TID) | ORAL | 0 refills | Status: DC | PRN
Start: 2017-10-15 — End: 2017-11-12

## 2017-10-15 NOTE — Progress Notes (Signed)
[facility]  [address] . [address] . [place], [place] [address]   . Phone [phone] . Fax [phone]      Rachael Paganini, MD      Rachael Bucy, MSN, CNP  [facility]      Board Certified in Pain Management  Board Certified in Anesthesia    10/15/2017    RE: Rachael Mays D.O.B.: 1956-06-06    Dear Rachael Mays,    Thank you for consulting [facility] regarding Rachael Mays. As you are aware, the patient is a 61 y.o., pleasant, Caucasian female with the chief complaint of pain in her back.     C/O PAIN IN T SPINE T10-T12 EQUALLY AND RAD TO THE FRONT    STARTED IN 2015 [facility]      Onset of pain wassudden onset and is constant.   Patient describes the pain as KNIFE LIKE      FACTORS THAT MAKE THE PAIN BETTER:  PAIN MEDS    FACTORS THAT MAKE THE PAIN WORSE:  ACTIVITY    MODALITIES DONE SO FAR FOR PAIN MANAGEMENT: Please see history and plan below for details.    INVESTIGATIONS: MRI scan of the T spine was reviewed.     OARRS/KASPER was reviewed.Yes   Does the pain wake the patient up at night? Yes  Does the pain make the patient feel depressed? No  Has the patient had suicidal thoughts or behaviors? No    Worst Pain on a scale of 0-10  is 8.   The patient reports that she is able to function only 50% of her full function capability.   PATIENT WORKING? No  Currently the patient on disability or Applying. No   Does litigation (lawsuit) exist regarding this pain problem? No    Red flag questionnaire  The patient has No history of cancer.  The patient has no unexplained weight loss.  The patient has no current infection or immunosuppression.  The patient has no  fever within the last week.  The patient has had no recent injury, major fall or motor vehicle accident with a suspected or actual fracture.  The patient has no accidents of bladder.   The patient has no accidents of bowels.       PAST MEDICAL HISTORY:    Past Medical History:    Diagnosis Date   . Anxiety    . Asthma    . Blood circulation, collateral    . CAD (coronary artery disease)    . CHF (congestive heart failure) ([place])    . Chronic back pain    . Deep vein thrombosis    . Degeneration of thoracic intervertebral disc 10/15/2017   . Depression    . GERD (gastroesophageal reflux disease)     NO LONGER SINCE LAP   . GERD (gastroesophageal reflux disease) 01/23/2009   . Hx of blood clots    . Hyperlipidemia     hx; resolved with lap band   . Hypertension    . Idiopathic peripheral neuropathy 03/26/2017   . Obesity     hx of; had lap band   . Obstructive sleep apnea (adult) (pediatric) 09/18/2015   . Pulmonary embolism ([place])    .     PAST SURGICAL HISTORY:    Past Surgical History:   Procedure Laterality Date   . BACK SURGERY      neck  plates   . BREAST SURGERY  left lumpectomy   . CESAREAN SECTION     . CHOLECYSTECTOMY     . COLONOSCOPY     . COLONOSCOPY  04/08/10   . CORONARY ANGIOPLASTY     . DIAGNOSTIC CARDIAC CATH LAB PROCEDURE     . DILATATION, ESOPHAGUS     . ENDOSCOPY, COLON, DIAGNOSTIC     . HYSTERECTOMY     . LAP BAND  05/01/08    Dr. Nicole Mays   . OTHER SURGICAL HISTORY      [place] Filter: curently in place   . OTHER SURGICAL HISTORY  07/05/13    lap band removal   . PARTIAL HYSTERECTOMY     . SHOULDER SURGERY     . UPPER GASTROINTESTINAL ENDOSCOPY  05/19/13   . UPPER GASTROINTESTINAL ENDOSCOPY  07/21/13    ESOPHAGEAL STENT PLACEMENT   . UPPER GASTROINTESTINAL ENDOSCOPY N/A 07/31/2014    Esophagogastroduodenoscopy with esophageal balloon dilation   . VASCULAR SURGERY  04/2015    Rachael Mays, celiac artery angiogram, normal abd arteries   .     MEDICATIONS:  List is noted in the chart.  Meds of importance are noted in plan.    ALLERGIES:    Allergies   Allergen Reactions   . Adhesive Tape      Local itching   . Morphine Itching   . Seasonal    . Talwin [Pentazocine] Other (See Comments)     dizzy   .     SOCIAL HISTORY:   Social History     Social History   . Marital status: Married      Spouse name: Immunologist   . Number of children: 2   . Years of education: 12     Occupational History   . Not on file.     Social History Main Topics   . Smoking status: Former Smoker     Packs/day: 0.50     Years: 40.00     Types: Cigarettes     Quit date: 08/07/2012   . Smokeless tobacco: Former Systems developer     Quit date: 06/16/2013      Comment: started to smoke at age 19 / only smoked 0.5 p.p.d    . Alcohol use No   . Drug use: No   . Sexual activity: No     Other Topics Concern   . Not on file     Social History Narrative    ** Merged History Encounter **        .   Has the patient abused street drugs? No  Is there any drug abuse in the family? No    PHYSICAL EXAMINATION: The patient was awake, alert, oriented, in no acute distress, well developed, well nourished and in no acute distress  PT CONVERSANT  EYES: anicteric sclerae, moist conjunctivae; no lid-lag  EARS: NO HEARING PROBLEMS  NECK: Trachea midline; supple, no thyromegaly or lymphadenopathy  LUNGS: CTA, with normal respiratory effort and no intercostal retractions  CV: RRR, no murmers appreciated  Abdomen: Soft, non-tender; no masses or tenderness or rigidity or guarding  LEGS: No peripheral edema         No scar.    No Tenderness was seen in the spinous processes.   No tenderness was seen in PARASPINAL muscles on the R. No tenderness was seen in paraspinal muscles on the L. .   No tenderness was seen in FACETS on R.  No tenderness was seen in facets on L side. No tenderness was  seen in Cassel on R side. No tenderness was seen in SI joints on L side    Lower extremities had normal MOTOR strength on R. Lower extremities had normal motor strength on  L side.    SENSORY deficits were not appreciated in the lower extremities on R  side. Sensory deficits were not appreciated in the lower extremities on L side.  PT had normal  Dorsiflexion on R side. PT had normal Dorsiflexion on  L side.   Normal plantar flexion on the R . Normal plantar flexion on the L side  Gait  was normal.     DIAGNOSIS:    1. Degeneration of thoracic intervertebral disc    2. 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  I will titrate opioids up to a certain extent if needed.     OARS/KASPER CHECKED AND FINE 10/15/2017  MEDICATIONS: ON EFFEXOR AND EFFIENT AND ASA AND XANAX AND TRAZ. FAILED NSAIDS AND TYL [FAILED BOTH 10/18] AND UNLFS.  INVESTIGATIONS  MRI T HAS CHANGES [place] [address]  PHYSICAL THERAPY: PTNI  CHIROPRACTOR: PTNI  SURGERY:  AS A LAST RESORT   TENS:   PAIN PSYCHOLOGY: PT NOT INTERESTED     Complete pain management programme was addressed with her as described above.    No ref. provider found  TERESA M O'BRIEN SLOUGH, MD    I hope you find this information useful.  If you have any questions or concerns, please feel free to call me. Thank you for letting us be a part of Rachael Mays's pain management.    Sincerely,  Rachael Mays, M.D., F.I.P.P.   Board Certified in Pain Management

## 2017-10-20 NOTE — Care Coordination-Inpatient (Signed)
Call to pt: states that she is doing well. Denies edema and SOB. Has been exercising and watching what she eats. Saw her gynecologist- had pap done and other labs. Does report "bloating" in her abdomen, states that her gynecologist has ordered an [place], since her mother had ovarian cancer. She has ordered genetic testing as well. Snyder congratulated pt on making her health and herself a priority.   POC: RNCC to f/u with pt x1 month: see how gyno work-up turned out.

## 2017-11-12 ENCOUNTER — Ambulatory Visit
Admit: 2017-11-12 | Discharge: 2017-11-12 | Payer: PRIVATE HEALTH INSURANCE | Attending: Interventional Pain Medicine | Primary: Sports Medicine

## 2017-11-12 DIAGNOSIS — M5124 Other intervertebral disc displacement, thoracic region: Secondary | ICD-10-CM

## 2017-11-12 MED ORDER — OXYCODONE-ACETAMINOPHEN 5-325 MG PO TABS
5-325 | ORAL_TABLET | Freq: Three times a day (TID) | ORAL | 0 refills | Status: DC | PRN
Start: 2017-11-12 — End: 2017-11-25

## 2017-11-12 MED ORDER — OXYCODONE-ACETAMINOPHEN 5-325 MG PO TABS
5-325 MG | ORAL_TABLET | Freq: Three times a day (TID) | ORAL | 0 refills | Status: DC | PRN
Start: 2017-11-12 — End: 2017-11-25
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
## 2017-11-14 ENCOUNTER — Encounter

## 2017-11-15 MED ORDER — MONTELUKAST SODIUM 10 MG PO TABS
10 MG | ORAL_TABLET | ORAL | 0 refills | Status: DC
Start: 2017-11-15 — End: 2018-02-24

## 2017-11-15 MED ORDER — OMEPRAZOLE 40 MG PO CPDR
40 MG | ORAL_CAPSULE | ORAL | 0 refills | Status: DC
Start: 2017-11-15 — End: 2018-02-01

## 2017-11-15 MED ORDER — LISINOPRIL 40 MG PO TABS
40 MG | ORAL_TABLET | ORAL | 1 refills | Status: DC
Start: 2017-11-15 — End: 2018-05-25

## 2017-11-22 NOTE — Care Coordination-Inpatient (Signed)
[place] call to pt: Reviewed signs and symptoms of worsening CHF and the importance of monitoring weight daily. Ptminded to inform PCP with weight gain of 3lbs in 2 days or 5lbs in one week. Pt has LE edema at baseline- currently no change/ increased/ decreased.) Currently taking spironolactone. PT no c/o SOB (above baseline.)    Plan: RNCC to f/u on CHF symptoms and management.

## 2017-11-23 NOTE — Telephone Encounter (Signed)
Pt has been sch'd with TS for 12/20.   Did not want to go to [place] 12/19 to see Evangeline

## 2017-11-23 NOTE — Telephone Encounter (Signed)
Pt called in looking to be seen today for a poss UTI. No provider in office has available apts but pt would like to be fit. Pls advise ,thanks !      Best contact :[phone](VK pt dont answer leave schedule info in vm)

## 2017-11-23 NOTE — Telephone Encounter (Signed)
Can you schedule patient for me please

## 2017-11-25 ENCOUNTER — Ambulatory Visit
Admit: 2017-11-25 | Discharge: 2017-11-25 | Payer: PRIVATE HEALTH INSURANCE | Attending: Family Medicine | Primary: Sports Medicine

## 2017-11-25 ENCOUNTER — Encounter

## 2017-11-25 DIAGNOSIS — N390 Urinary tract infection, site not specified: Secondary | ICD-10-CM

## 2017-11-25 LAB — POC URINE WITH MICROSCOPIC
Bacteria Urine, POC: 0
Bilirubin Urine: 4 mg/dL
Blood, Urine: NEGATIVE
Casts Urine, POC: 0
Epi Cells Urine, POC: 0
Glucose, Ur: NEGATIVE
Ketones, Urine: POSITIVE
Leukocytes, UA: NEGATIVE
Nitrite, Urine: NEGATIVE
Protein, UA: NEGATIVE
RBC Urine, POC: 0
Specific Gravity, UA: 1.03 (ref 1.005–1.030)
Urobilinogen, Urine: NORMAL
WBC Urine, POC: 0
crystals urine, poc: 0
pH, UA: 5.5 (ref 4.5–8.0)
yeast urine, poc: 0

## 2017-11-25 NOTE — Addendum Note (Signed)
Addended by: Colette Ribas on: 11/25/2017 04:29 PM     Modules accepted: Orders

## 2017-11-25 NOTE — Progress Notes (Signed)
Patient complains of a UTI for two weeks.  Associated symptoms consist of dysuria, hesitancy, back pain, and urine is brown with a foul smell.  Patient is not currently taking any OTC medications.

## 2017-11-25 NOTE — Progress Notes (Signed)
Subjective:      Patient ID: Rachael Mays is a 61 y.o. female.    HPI     GU Symptoms:  Patient complains of a 2 week(s) history of dysuria, foul-smelling urine and cloudy urine.  She denies abdominal/flank pain, fever and nausea/vomiting. Symptoms have been unchanged with time.  Relevant medical history: none. Treatment to date: none.    Review of Systems    Patient Active Problem List   Diagnosis   . Back pain   . Deep vein thrombosis ([place])   . Depression   . Pulmonary embolism ([place])   . Nausea & vomiting   . Abdominal  pain, other specified site   . Morbid obesity with BMI of 40.0-44.9, adult ([place])   . Status post gastric banding   . Vertigo   . Dizziness   . Tinnitus, subjective   . Other complications of gastric band procedure   . Essential hypertension   . Hyperglycemia   . Atypical chest pain   . 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 Adrenal mass, left ([place])   . Adrenal nodule ([place])   . Obstructive sleep apnea   . Dyspnea and respiratory abnormalities   . Abnormal stress test   . Shortness of breath   . Chronic diastolic congestive heart failure ([place])   . Coronary artery disease due to lipid rich plaque   . Pulmonary hypertension   . 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 Chest pain   . Acute actinic otitis externa of both ears   . Anxiety and depression   . Bradycardia   . Dyslipidemia   . Chronic eczematous otitis externa of both ears   . Mucositis   . Sprain of radiocarpal joint of right wrist   . Idiopathic peripheral neuropathy   . Chest pain in adult   . Hypoxia   . Chronic pain syndrome   . Unstable angina ([place])   . Degeneration of thoracic intervertebral disc   . Displacement of thoracic intervertebral disc without myelopathy       Outpatient Prescriptions Marked as Taking for the 11/25/17 encounter (Office Visit) with Harvel Quale, MD   Medication Sig Dispense Refill   . lisinopril (PRINIVIL;ZESTRIL) 40 MG tablet TAKE ONE TABLET BY MOUTH ONE  TIME A DAY 90 tablet 1   . omeprazole (PRILOSEC) 40 MG delayed release capsule TAKE 1 CAPSULE BY MOUTH ONE TIME A DAY 90 capsule 0   . montelukast (SINGULAIR) 10 MG tablet TAKE 1 TABLET BY MOUTH ONE TIME A DAY 90 tablet 0   . gabapentin (NEURONTIN) 100 MG capsule Take 2 capsules by mouth nightly.. 180 capsule 3   . ALPRAZolam (XANAX) 0.25 MG tablet Take 1 tablet by mouth 3 times daily as needed for Sleep.. 30 tablet 0   . ranolazine (RANEXA) 500 MG extended release tablet Take 1 tablet by mouth 2 times daily 180 tablet 3   . metoprolol (LOPRESSOR) 100 MG tablet TAKE 1 AND 1/2 TABLET BY MOUTH TWO TIMES A DAY 270 tablet 1   . prasugrel (EFFIENT) 10 MG TABS TAKE 1 TABLET BY MOUTH DAILY 90 tablet 3   . aspirin 81 MG EC tablet Take 1 tablet by mouth daily 30 tablet 11   . fluocinonide (LIDEX) 0.05 % external solution APPLY SPARINGLY TO THE BACK OF THE SCALP DAILY IF NEEDED FOR ITCHING 60 mL 1   . spironolactone (ALDACTONE) 25 MG tablet Take  1.5 tablets by mouth daily 90 tablet 3   . traZODone (DESYREL) 100 MG tablet TAKE ONE TABLET BY MOUTH NIGHTLY 90 tablet 3   . venlafaxine (EFFEXOR XR) 150 MG extended release capsule Take 1 capsule by mouth daily 90 capsule 3   . torsemide (DEMADEX) 20 MG tablet Take 2 tablets by mouth daily (Patient taking differently: Take 20 mg by mouth daily Takes an extra dose for a wt gain > 3#) 180 tablet 3   . triamcinolone (KENALOG) 0.1 % cream Apply topically 2 times daily.Apply with Q tip in each ear BID for 1 week. 15 g 1   . mupirocin (BACTROBAN) 2 % ointment Apply topically  Daily.Apply with Q tip in each naris at night 15 g 0   . oxyCODONE-acetaminophen (PERCOCET) 5-325 MG per tablet Take 1 tablet by mouth every 6 hours as needed for Pain . (Patient taking differently: Take 1 tablet by mouth every 8 hours as needed for Pain. Marland Kitchen) 40 tablet 0   . albuterol sulfate HFA (PROVENTIL HFA) 108 (90 BASE) MCG/ACT inhaler Inhale 2 puffs into the lungs every 6 hours as needed for Wheezing 3 Inhaler  3   . Multiple Vitamins-Minerals (MULTIVITAMIN PO) Take 1 tablet by mouth daily      . CALCIUM-VITAMIN D PO Take 1 tablet by mouth daily      . cetirizine (ZYRTEC ALLERGY) 10 MG tablet Take 10 mg by mouth daily.         Allergies   Allergen Reactions   . Adhesive Tape      Local itching   . Morphine Itching   . Seasonal    . Talwin [Pentazocine] Other (See Comments)     dizzy       Social History   Substance Use Topics   . Smoking status: Former Smoker     Packs/day: 0.50     Years: 40.00     Types: Cigarettes     Quit date: 08/07/2012   . Smokeless tobacco: Former Systems developer     Quit date: 06/16/2013      Comment: started to smoke at age 59 / only smoked 0.5 p.p.d    . Alcohol use No       Objective:   BP 132/72 (Site: Right Upper Arm, Position: Sitting, Cuff Size: Large Adult)   Pulse 63   Temp 97.8 F (36.6 C) (Temporal)   Wt (!) 302 lb 12.8 oz (137.3 kg)   SpO2 93%   BMI 48.87 kg/m     Physical Exam   Constitutional: She is oriented to person, place, and time. She appears well-developed and well-nourished. No distress.   Cardiovascular: Normal rate, regular rhythm and normal heart sounds.    No murmur heard.  Pulmonary/Chest: Effort normal and breath sounds normal. No respiratory distress. She has no wheezes. She has no rales.   Abdominal: Soft. Bowel sounds are normal. There is no guarding.   Obese, no CVA tenderness   Neurological: She is alert and oriented to person, place, and time.   Psychiatric: She has a normal mood and affect. Her behavior is normal.       Assessment:      Diagnosis Orders   1. Urinary tract infection without hematuria, site unspecified         Plan:     Suspect that patient has UTI. She was unable to leave a urine sample today. She was given a specimen container and asked to return to the office with  a specimen later today. If positive, we'll treat with Macrobid 100 mg twice a day for 7 days and culture urine.

## 2017-11-25 NOTE — Addendum Note (Signed)
Addended by: Colette Ribas on: 11/25/2017 04:40 PM     Modules accepted: Orders

## 2017-11-27 LAB — CULTURE, URINE: Urine Culture, Routine: <50000

## 2017-12-08 NOTE — Telephone Encounter (Signed)
Patient called and the rash she has is not going away.  It is from hip to hip and under breasts.  It has a bad order to it as well.    Call back # [phone]

## 2017-12-09 NOTE — Telephone Encounter (Signed)
Patient called back and is scheduled

## 2017-12-09 NOTE — Telephone Encounter (Signed)
Called and LM for pt to call the office.

## 2017-12-13 ENCOUNTER — Encounter

## 2017-12-14 ENCOUNTER — Inpatient Hospital Stay: Payer: PRIVATE HEALTH INSURANCE | Primary: Sports Medicine

## 2017-12-14 ENCOUNTER — Encounter

## 2017-12-14 DIAGNOSIS — R899 Unspecified abnormal finding in specimens from other organs, systems and tissues: Secondary | ICD-10-CM

## 2017-12-14 LAB — BASIC METABOLIC PANEL
Anion Gap: 15 (ref 3–16)
BUN: 14 mg/dL (ref 7–20)
CO2: 24 mmol/L (ref 21–32)
Calcium: 9.5 mg/dL (ref 8.3–10.6)
Chloride: 107 mmol/L (ref 99–110)
Creatinine: 1.1 mg/dL (ref 0.6–1.1)
GFR African American: >60 (ref >60)
GFR Non-African American: 51 — AB (ref >60)
Glucose: 192 mg/dL — ABNORMAL HIGH (ref 70–99)
Potassium: 4.4 mmol/L (ref 3.5–5.1)
Sodium: 146 mmol/L — ABNORMAL HIGH (ref 136–145)

## 2017-12-14 NOTE — Telephone Encounter (Signed)
Pt last seen 6/6 with recommended f/u 08/2017.  Pt cancelled 08/2017 f/u appt with NPTS.    Filling for 90 days with statement of need for OV.

## 2017-12-16 ENCOUNTER — Telehealth

## 2017-12-16 ENCOUNTER — Ambulatory Visit
Admit: 2017-12-16 | Discharge: 2017-12-16 | Payer: PRIVATE HEALTH INSURANCE | Attending: Dermatology | Primary: Sports Medicine

## 2017-12-16 DIAGNOSIS — L821 Other seborrheic keratosis: Secondary | ICD-10-CM

## 2017-12-16 MED ORDER — SPIRONOLACTONE 25 MG PO TABS
25 | ORAL_TABLET | Freq: Every day | ORAL | 1 refills | Status: DC
Start: 2017-12-16 — End: 2017-12-18

## 2017-12-16 MED ORDER — FLUOCINONIDE 0.05 % EX SOLN
0.05 | CUTANEOUS | 1 refills | Status: DC
Start: 2017-12-16 — End: 2018-02-16

## 2017-12-16 NOTE — Progress Notes (Signed)
[facility]  Antonietta Breach, MD  Rachael Mays  06/18/1956    62 y.o. female     Date of Visit: 12/16/2017    Chief Complaint: rash and skin lesions    History of Present Illness:    1.  She c/o 2 lesions on the right cheek - neither is symptomatic.    2.  Unknown duration of a newly noted lesion on the back - friend noticed recently.     3.  Follow up for seborrheic dermatitis of the scalp - still comes and goes.  Improves with Lidex solution.    4.  F/u for intertrigo - still waxes and wanes.  Has improved with use of Degree antiperspirant under her breasts.      Review of Systems:  Skin: No new or changing moles.    Past Medical History, Family History, Surgical History, Medications and Allergies reviewed.    Past Medical History:   Diagnosis Date   . Anxiety    . Asthma    . Blood circulation, collateral    . CAD (coronary artery disease)    . CHF (congestive heart failure) ([place])    . Chronic back pain    . Deep vein thrombosis    . Degeneration of thoracic intervertebral disc 10/15/2017   . Depression    . GERD (gastroesophageal reflux disease)     NO LONGER SINCE LAP   . GERD (gastroesophageal reflux disease) 01/23/2009   . Hx of blood clots    . Hyperlipidemia     hx; resolved with lap band   . Hypertension    . Idiopathic peripheral neuropathy 03/26/2017   . Obesity     hx of; had lap band   . Obstructive sleep apnea (adult) (pediatric) 09/18/2015   . Pulmonary embolism [facility])      Past Surgical History:   Procedure Laterality Date   . BACK SURGERY      neck  plates   . BREAST SURGERY      left lumpectomy   . CESAREAN SECTION     . CHOLECYSTECTOMY     . COLONOSCOPY     . COLONOSCOPY  04/08/10   . CORONARY ANGIOPLASTY     . DIAGNOSTIC CARDIAC CATH LAB PROCEDURE     . DILATATION, ESOPHAGUS     . ENDOSCOPY, COLON, DIAGNOSTIC     . HYSTERECTOMY     . LAP BAND  05/01/08    Dr. Nicole Cella   . OTHER SURGICAL HISTORY      [place] Filter: curently in place   . OTHER SURGICAL HISTORY   07/05/13    lap band removal   . PARTIAL HYSTERECTOMY     . SHOULDER SURGERY     . UPPER GASTROINTESTINAL ENDOSCOPY  05/19/13   . UPPER GASTROINTESTINAL ENDOSCOPY  07/21/13    ESOPHAGEAL STENT PLACEMENT   . UPPER GASTROINTESTINAL ENDOSCOPY N/A 07/31/2014    Esophagogastroduodenoscopy with esophageal balloon dilation   . VASCULAR SURGERY  04/2015    Lucendia Herrlich, celiac artery angiogram, normal abd arteries       Allergies   Allergen Reactions   . Adhesive Tape      Local itching   . Morphine Itching     Per PT she has had recently w/no reaction 12/16/17   . Seasonal    . Talwin [Pentazocine] Other (See Comments)     dizzy     Outpatient Prescriptions Marked as Taking  for the 12/16/17 encounter (Office Visit) with Francisca December, MD   Medication Sig Dispense Refill   . spironolactone (ALDACTONE) 25 MG tablet Take 1.5 tablets by mouth daily 135 tablet 1   . lisinopril (PRINIVIL;ZESTRIL) 40 MG tablet TAKE ONE TABLET BY MOUTH ONE TIME A DAY 90 tablet 1   . omeprazole (PRILOSEC) 40 MG delayed release capsule TAKE 1 CAPSULE BY MOUTH ONE TIME A DAY 90 capsule 0   . montelukast (SINGULAIR) 10 MG tablet TAKE 1 TABLET BY MOUTH ONE TIME A DAY 90 tablet 0   . gabapentin (NEURONTIN) 100 MG capsule Take 2 capsules by mouth nightly.. 180 capsule 3   . ALPRAZolam (XANAX) 0.25 MG tablet Take 1 tablet by mouth 3 times daily as needed for Sleep.. 30 tablet 0   . zoster recombinant adjuvanted vaccine (SHINGRIX) 50 MCG SUSR injection .38mL IM X 1 followed by .25mL IM 2-6 months after dose #1 0.5 mL 0   . ranolazine (RANEXA) 500 MG extended release tablet Take 1 tablet by mouth 2 times daily 180 tablet 3   . metoprolol (LOPRESSOR) 100 MG tablet TAKE 1 AND 1/2 TABLET BY MOUTH TWO TIMES A DAY 270 tablet 1   . prasugrel (EFFIENT) 10 MG TABS TAKE 1 TABLET BY MOUTH DAILY 90 tablet 3   . aspirin 81 MG EC tablet Take 1 tablet by mouth daily 30 tablet 11   . fluocinonide (LIDEX) 0.05 % external solution APPLY SPARINGLY TO THE BACK OF THE SCALP DAILY IF  NEEDED FOR ITCHING 60 mL 1   . traZODone (DESYREL) 100 MG tablet TAKE ONE TABLET BY MOUTH NIGHTLY 90 tablet 3   . venlafaxine (EFFEXOR XR) 150 MG extended release capsule Take 1 capsule by mouth daily 90 capsule 3   . nitroGLYCERIN (NITROSTAT) 0.4 MG SL tablet up to max of 3 total doses. If no relief after 1 dose, call 911. 25 tablet 3   . torsemide (DEMADEX) 20 MG tablet Take 2 tablets by mouth daily (Patient taking differently: Take 20 mg by mouth daily Takes an extra dose for a wt gain > 3#) 180 tablet 3   . triamcinolone (KENALOG) 0.1 % cream Apply topically 2 times daily.Apply with Q tip in each ear BID for 1 week. 15 g 1   . mupirocin (BACTROBAN) 2 % ointment Apply topically  Daily.Apply with Q tip in each naris at night 15 g 0   . oxyCODONE-acetaminophen (PERCOCET) 5-325 MG per tablet Take 1 tablet by mouth every 6 hours as needed for Pain . (Patient taking differently: Take 1 tablet by mouth every 8 hours as needed for Pain. Marland Kitchen) 40 tablet 0   . albuterol sulfate HFA (PROVENTIL HFA) 108 (90 BASE) MCG/ACT inhaler Inhale 2 puffs into the lungs every 6 hours as needed for Wheezing 3 Inhaler 3   . Multiple Vitamins-Minerals (MULTIVITAMIN PO) Take 1 tablet by mouth daily      . CALCIUM-VITAMIN D PO Take 1 tablet by mouth daily      . cetirizine (ZYRTEC ALLERGY) 10 MG tablet Take 10 mg by mouth daily.       Physical Examination       The following were examined and determined to be normal: Psych/Neuro, Head/face, Conjunctivae/eyelids and Gums/teeth/lips.    The following were examined and determined to be abnormal: Scalp/hair, Breast/axilla/chest, Abdomen, Back and Genitalia/groin/buttocks.     Well appearing.    1.  R medial cheek and right lateral cheek - 2 stuck on  appearing verrucous brown papules.     2.  Right central upper back - 1.5 cm pink brown patch.           3.  Right occipital scalp - well defined scaly erythematous patch.     4.  Below the pannus and medial inframammary regions - macerated  erythematous patches.       Assessment and Plan     1. SK (seborrheic keratosis)     Reassurance.      2. Neoplasm of uncertain behavior of skin, right upper back - ? Lichenoid keratosis, doubt BCC/MM    Discussed possible diagnosis; patient agreeable to biopsy (verbal consent obtained).    The area(s) to be biopsied were marked with a surgical pen.  Alcohol was used to cleanse the site. Local anesthesia was acheived with 1% lidocaine with epinephrine. Shave biopsy was performed using a razor blade.  Hemostasis was achieved with aluminum chloride. The wound(s) were dressed with petrolatum and covered with a bandage.  Wound care instructions were reviewed.  1 Specimen (s) sent to pathology.  The specimen bottles were appropriately labeled.  We also reviewed the risks of bleeding, scar, and infection.       3. Seborrheic dermatitis of the scalp - focal    Lidex solution daily as needed.     4. Intertrigo     Encouraged weight loss.     Encouraged drying measures.     Triamcinolone cream twice daily as needed for flares.

## 2017-12-16 NOTE — Telephone Encounter (Signed)
Medication Refill    When was your last appointment with cardiology?  (if 1year or longer, please schedule an appointment)    Medication needing refilled: spironolactone     Doseage of the medication:    How are you taking this medication (QD, BID, TID, QID, PRN):    Patient want a 30 or 90 day supply called in:    Which Pharmacy are we sending the medication NI:OEVOJ mail order     Pharmacy Phone number:    Pharmacy Fax number:rx for this was sent to pill pack 02/2017 but patient is an employee of [employer] so she has to get rx from [facility]

## 2017-12-16 NOTE — Telephone Encounter (Signed)
Last ov: 05/12/17  Last labs: 12/14/17  Next appointment:

## 2017-12-16 NOTE — Patient Instructions (Signed)
Biopsy Wound Care Instructions     Keep the bandage in place for 24 hours.    Cleanse the wound with mild soapy water daily  . Gently dry the area.  . Apply Vaseline or petroleum jelly to the wound using a cotton tipped applicator.  . Cover with a clean bandage.  . Repeat this process until the biopsy site is healed.  . If you had stitches placed, continue treating the site until the stitches are removed. Remember to make an appointment to return to have your stitches removed by our staff.  . You may shower and bathe as usual.       ** Biopsy results generally take around 7 business days to come back.  If you have not heard from us by then, please call the office at [phone] between 8AM and 4PM Monday through Friday.

## 2017-12-18 MED ORDER — SPIRONOLACTONE 25 MG PO TABS
25 MG | ORAL_TABLET | Freq: Every day | ORAL | 0 refills | Status: DC
Start: 2017-12-18 — End: 2018-03-31

## 2017-12-18 NOTE — Telephone Encounter (Signed)
Needs labs and appt for additional refills

## 2017-12-20 NOTE — Care Coordination-Inpatient (Signed)
Rachael Mays call to pt for f/u. Pt states that she is doing well. Pt denies ACC needs at this time. RNCC to f/u x1 month. If pt still stable/ without ACC need, will consider closing this ACC episode.

## 2017-12-21 LAB — DERMATOLOGY PATHOLOGY

## 2017-12-22 ENCOUNTER — Telehealth

## 2017-12-22 NOTE — Telephone Encounter (Signed)
Reviewed results of the biopsy with the patient.    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    Plan:     Wide local excision with Dr. Caryn Section.     Discussed need for full skin exams with me every 6 mos.      The patient expressed understanding of the plan.      Rachael Mays - she cannot come tomorrow afternoon b/c she's in [place] and won't be back until early afternoon tomorrow (thurs).  She is available any other day prior to 5 PM.

## 2017-12-23 ENCOUNTER — Encounter: Attending: Dermatology | Primary: Sports Medicine

## 2017-12-24 NOTE — Telephone Encounter (Signed)
Spoke with [facility] and they do not accept her insurance for pathologies. Informed Rachael Mays. Her upcoming appointment 12/29/17 will be sent to [facility].

## 2017-12-27 NOTE — Telephone Encounter (Signed)
Excision - R central upper back scheduled for 1/23 at 2 pm per Dr. Caryn Section

## 2017-12-28 NOTE — Telephone Encounter (Signed)
Pt stated that the way she is to take her gabapentin has changed and she needs a new script sent to the pharmacy

## 2017-12-29 ENCOUNTER — Ambulatory Visit
Admit: 2017-12-29 | Discharge: 2017-12-29 | Payer: PRIVATE HEALTH INSURANCE | Attending: Dermatology | Primary: Sports Medicine

## 2017-12-29 DIAGNOSIS — C4359 Malignant melanoma of other part of trunk: Secondary | ICD-10-CM

## 2017-12-29 NOTE — Progress Notes (Signed)
PRE-PROCEDURE SCREENING    Pacemaker/ICD: No  Difficulty with numbing in the past: No  Local Anesthesia Reaction/passing out: No  Latex or adhesive allergy:  Yes, adhesive tape sensitve  Bleeding/Clotting Disorders: Yes, hx of blood clots  Anticoagulant Therapy: Yes, asa 81 mg  Joint prosthesis: No  Artificial Heart Valve: No  Stroke or Seizures: No  Organ Transplant or Lymphoma: No  Immunosuppression: No  Respiratory Problems: No

## 2017-12-29 NOTE — Patient Instructions (Signed)
[facility] Office Hours:    Monday-Thursday  7:30 AM-4:30 PM    Friday  9:00 AM-3:00 PM    POST-OPERATIVE CARE FOR STITCHES  Bandage change in 48 hours    CARING FOR YOUR SURGICAL SITE  Keep our bandage on and dry for the first 48 hours. Do not get the bandage wet.          1.  After 48 hours, gently remove the remaining part of the bandage. It can be helpful to moisten the  bandage edges in the shower. Steri-strips may still be on the wound. They will fall off slowly with the daily wound care.         2.  Gently clean the wound daily with mild soap and water. Try to clean off crust and debris.        3.  Dry (pat) the area with a clean Q-tip or gauze.        4.  Apply a layer of Vaseline/ Aquaphor (or Bacitracin if your doctor recommends) to the wound area only.        5.  Cut a piece of Telfa (or any non-stick dressing) to fit just over the wound and secure it with paper tape. If the wound is small you may use a Band-Aid. Keep area covered for 2 weeks.    If the dressing comes off or if you have questions, or concerns about the dressing, please call the office for instructions!    POST-OPERATIVE INSTRUCTIONS     1.  Activity: Do not lift anything heavier than a gallon of milk for 1 week. Also, avoid  strenuous activity such as running, power walking or contact sports.        2.  Eating and drinking: Do not drink alcohol for 48 hours after your procedure. Alcohol increases the chances of bleeding.        3.  Medicines                -If you have discomfort, take acetaminophen (Tylenol or Extra Strength Tylenol).  Follow the instructions and warning on the bottle.                -If your doctor has prescribed you an aspirin daily, please keep taking it. Do not take extra aspirin or medicines containing aspirin (such as Alka-Seltzer and Excedrin) for 48 hours after your procedure.    ? Bleeding: If bleeding occurs, DO NOT remove the bandage. Put firm pressure on the area with gauze for 20  minutes without peeking. If the bleeding continues, apply pressure for 20 minutes more.  ? If the bleeding does not stop after you apply pressure, call us right away. If you can't call, go to the nearest emergency room or urgent care facility.      What to Expect:  You may have these symptoms. They are normal and should get better with time:        1.  Swelling. Swelling usually increases for 24 to 48 hours after your procedure and then begins to improve.  Some soreness and redness around your wound.        2.  Bruising, which could last 1 week or more.        3.  Pink and bumpy appearance to the scar. This may happen a few weeks after your procedure. After 4 weeks, you may gently massage the area each day with a facial moisturizer or petroleum jelly (Vaseline) or  Aquaphor. This will help to smooth the skin and improve the appearance of the scar. The color of your scar will fade over time, but may be pink for several months after the procedure. The scar may take 6 months to 1 year to reach its final color and appearance.        4. "Spitting" suture. Occasionally, an inside suture (stitch) does not completely dissolve. When this happens, (generally 4-8 weeks after surgery), it causes a bump or "pimple" to form on the scar. This is easily removed and is not at all serious. It does not mean the skin cancer has returned. Contact us if it happens, but do not be alarmed.        Vitamin E oil is NOT necessary. A good moisturizer is just as effective.   Sunscreen IS necessary. Use at least an SPF 30 sunscreen daily- even in the winter.     Call us at [phone] right away if you have any of the following symptoms:  . Bleeding that you can't stop (see above)  . Pain that lasts longer than 48 hours  . Your wound becomes more painful, red or hot  . Bruising and swelling that does not improve within 48 hours or gets worse suddenly

## 2017-12-29 NOTE — Progress Notes (Signed)
EXCISION OPERATIVE PROCEDURE NOTE    SURGEON: Mirian Mo. 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     OPERATIVE PROCEDURE: EXCISION    RECONSTRUCTION OF DEFECT: Complex layered closure    LOCATION: Right central upper back     SIZE OF LESION: 17x15 MM     SIZE OF LESION PLUS CIRCUMFERENTIAL MARGIN: 27x25 MM     FINAL REPAIR LENGTH:  72 MM    ANESTHESIA: 31 CC XYLOCAINE 1% WITH EPINEPHRINE 1:100,000, BUFFERED.      DURATION OF PROCEDURE: 30 MINUTES     POSTOPERATIVE OBSERVATION: 30 MINUTES     EBL: MINIMAL.     SPECIMENS: One    COMPLICATIONS: NONE     DESCRIPTION OF PROCEDURE: The patient was given a mirror and the biopsy site was identified, marked with surgical marking pen, and verified with the patient. Written consent was obtained. There was a time out for person and procedure verification. The operative site was cleansed with Chlorhexidine gluconate 4% solution, then cleaned off, dried, and draped sterilely. The lesion was excised in a fusiform design down to subcutaneous fat with 10 mm margins. Hemostasis was achieved with electrosurgery.     DEFECT MANAGEMENT:  Various closure modalities were discussed with the patient, and it was decided that an Complex layered closure would best preserve normal anatomic and functional relationships. Additional risk of wound dehiscence was discussed.     The final incision lines were placed with respect for the patient's natural skin tension lines in a linear configuration to avoid functional and aesthetic distortion of adjacent free margins. Following extensive undermining, meticulous hemostasis was obtained with spot monopolar electrocoagulation.  Subcutaneous dead space and dermis were closed using 3-0 Vicryl buried subcutaneous interrupted suture and the epidermis was approximated with 4-0  Prolene interrupted stitches.    The wound was cleaned with normal saline solution, dried off, Aquaphor ointment was applied, and the wound was covered.  A dressing was applied for stabilization and light pressure.  The patient was given detailed oral and written instructions on postoperative care.    There were no complications.  The patient left the Unit in good medical condition and was scheduled to return for suture removal/wound check in 14 days.

## 2017-12-30 MED ORDER — GABAPENTIN 300 MG PO CAPS
300 | ORAL_CAPSULE | Freq: Every evening | ORAL | 3 refills | Status: DC
Start: 2017-12-30 — End: 2018-09-01

## 2017-12-30 NOTE — Telephone Encounter (Signed)
Left message for patient to call back.

## 2017-12-30 NOTE — Telephone Encounter (Signed)
The patient was in the office on 12/29/17 for an excision located on the right central upper back with CLC repair.  The patient tolerated the procedure well and left the office in good condition.    A post-operative telephone call was placed at 1312 in order to check on the patient's recovery process.  The patient reported doing well and had no complaints.  All of the patient's questions were answered.

## 2017-12-30 NOTE — Telephone Encounter (Signed)
Patient called back, she is out of the gabapentin since she was told to take 300mg  at night, she wants to know if she can have a new prescription sent to the [facility] mail order and it needs to have the new instructions on it.

## 2017-12-30 NOTE — Telephone Encounter (Signed)
Ok for this?  Please advise

## 2017-12-31 MED ORDER — NITROGLYCERIN 0.4 MG SL SUBL
0.4 | ORAL_TABLET | SUBLINGUAL | 3 refills | Status: DC
Start: 2017-12-31 — End: 2019-03-31

## 2017-12-31 NOTE — Telephone Encounter (Signed)
Patient is still waiting on refill of gabapentin since it was increased to 300mg  at bedtime. Can you check on this for her?

## 2017-12-31 NOTE — Telephone Encounter (Signed)
Patient has been informed that her medication has been sent to the pharmacy.

## 2018-01-04 LAB — DERMATOLOGY PATHOLOGY

## 2018-01-04 NOTE — Telephone Encounter (Signed)
-----   Message from Darryl Nestle, MD sent at 01/04/2018 11:16 AM EST -----  Residual melanoma, completely excised,  Parameters do not indicate any further tx or diagnostic measures.

## 2018-01-04 NOTE — Telephone Encounter (Signed)
I reviewed results of the biopsy with the patient.    Date of biopsy: 12/29/17  Site of biopsy: Right central upper back  Result: Residual invasive melanoma, superficial spreading, completely excised    Plan: Completely excised and no further treatment. Explained to patient parameters do not indicate any further treatment or diagnostic measures.    The patient expressed understanding of the plan.

## 2018-01-12 NOTE — Telephone Encounter (Signed)
Patient called back

## 2018-01-13 ENCOUNTER — Encounter: Admit: 2018-01-13 | Discharge: 2018-01-13 | Payer: PRIVATE HEALTH INSURANCE | Primary: Sports Medicine

## 2018-01-13 DIAGNOSIS — Z4802 Encounter for removal of sutures: Secondary | ICD-10-CM

## 2018-01-13 NOTE — Patient Instructions (Signed)
[facility] Office Hours:    Monday-Thursday  7:30 AM-4:30 PM    Friday  9:00 AM-3:00 PM    WOUND CARE AFTER SUTURE REMOVAL    After your stiches have been removed, your scar is still very fragile. In fact, scars continue to change and evolve, what we call remodel, for about a year after your procedure.  Follow the following steps below to ensure that your scar heals well.    Instructions    1. If Steri-strips were applied, keep them on until they fall off on their own.  2. Protect your scar from the sun. Use a sunscreen or bandage to cover your scar. Sun exposure can cause your scar to become discolored and appear red or brown.  3. To help soften your scar more rapidly, it is helpful, but not necessary, for you to   massage the scar gently each night for twenty minutes.   4. "Spitting" suture. Occasionally, an inside suture (stitch) does not completely dissolve. When this happens, (generally 4-8 weeks after surgery), it causes a bump or "pimple" to form on the scar. This is easily removed and is not at all serious. It   does not mean the skin cancer has returned. Contact us if it happens, but do not be alarmed.      5. If you scar becomes tender, itchy or becomes very large, let Dr. Fisher know.  There    are treatments that can improve the appearance of your scar or help make it more comfortable.

## 2018-01-13 NOTE — Progress Notes (Signed)
Patient is here for suture removal s/p excision of an MMIS on the Right central upper back. The site appears well-healed without signs of infection (redness, pain or discharge). The sutures were removed. Steri-strips were not applied.  Wound care and activity instructions given.     Pathology results were reviewed with the patient.  Further treatment is not indicated given the lesion was completely excised.    F/u prn.

## 2018-01-14 ENCOUNTER — Encounter: Attending: Nurse Practitioner | Primary: Sports Medicine

## 2018-01-18 ENCOUNTER — Ambulatory Visit
Admit: 2018-01-18 | Discharge: 2018-01-18 | Payer: PRIVATE HEALTH INSURANCE | Attending: Nurse Practitioner | Primary: Sports Medicine

## 2018-01-18 DIAGNOSIS — M5124 Other intervertebral disc displacement, thoracic region: Secondary | ICD-10-CM

## 2018-01-18 MED ORDER — NALOXONE HCL 4 MG/0.1ML NA LIQD
4 | NASAL | 0 refills | Status: DC | PRN
Start: 2018-01-18 — End: 2018-03-17

## 2018-01-18 MED ORDER — OXYCODONE-ACETAMINOPHEN 5-325 MG PO TABS
5-325 | ORAL_TABLET | Freq: Three times a day (TID) | ORAL | 0 refills | Status: DC
Start: 2018-01-18 — End: 2018-03-17

## 2018-01-18 MED ORDER — OXYCODONE-ACETAMINOPHEN 5-325 MG PO TABS
5-325 | ORAL_TABLET | Freq: Three times a day (TID) | ORAL | 0 refills | Status: DC
Start: 2018-01-18 — End: 2018-04-29

## 2018-01-18 NOTE — Progress Notes (Signed)
[place]  1956-08-20  01/18/18    Chief Complaint   Patient presents with   . Back Pain       Social History     Social History   . Marital status: Married     Spouse name: Immunologist   . Number of children: 2   . Years of education: 12     Social History Main Topics   . Smoking status: Former Smoker     Packs/day: 0.50     Years: 40.00     Types: Cigarettes     Quit date: 08/07/2012   . Smokeless tobacco: Former Systems developer     Quit date: 06/16/2013      Comment: started to smoke at age 65 / only smoked 0.5 p.p.d    . Alcohol use No   . Drug use: No   . Sexual activity: No     Other Topics Concern   . Not on file     Social History Narrative    ** Merged History Encounter **          Past Medical History:   Diagnosis Date   . Anxiety    . Asthma    . Blood circulation, collateral    . CAD (coronary artery disease)    . CHF (congestive heart failure) ([place])    . Chronic back pain    . Deep vein thrombosis    . Degeneration of thoracic intervertebral disc 10/15/2017   . Depression    . GERD (gastroesophageal reflux disease)     NO LONGER SINCE LAP   . GERD (gastroesophageal reflux disease) 01/23/2009   . Hx of blood clots    . Hyperlipidemia     hx; resolved with lap band   . Hypertension    . Idiopathic peripheral neuropathy 03/26/2017   . Melanoma ([place])     upper back   . Obesity     hx of; had lap band   . Obstructive sleep apnea (adult) (pediatric) 09/18/2015   . Pulmonary embolism ([place])      HISTORY OF PRESENT ILLNESS:   The patient reports pain to the thoracic spine.  Pain is reported from T 1 to T7 on across side. Right = left.     Rates pain as 8/10 on NRS,  constant.  Describes as:  Aching  yes  Burning    Stabbing   Cramping    Sharp    Dull    Deep    Throbbing    Shooting yes  Electric     Pins and Needles      Recent excision melanoma upper thoracic back, per Dr Cammy Brochure, sutures just removed    Review of Systems   Constitutional: Negative for fever and unexpected weight  change.   Psychiatric/Behavioral: Negative for agitation.       REVIEW OF SYSTEMS:  Genitourinary:  There are not accidents of thebladder.  Gastrointestinal:  There are not accidents of the bowel.    NEUROLOGICAL:    The patient does  report numbness to both lower extremity.  The patient does  report tingling to  both lower extremity.  The patient does  report weakness to  both lower extremity.    OPIOID MEDICATION HISTORY:  The patient is currently prescribed opioid medication.  The patient reports that the opioid medication improves their ability to perform  :  Personal care:  yes  House work:  yes  Physical activity: yes  Shop:  yes  Social activity:  yes  Work : yes    Pt reports no side effects from their opioid medication, except constipation controlled.    The patient reports that the medication is helping their pain. The medication is helping them to be more functional.  They need opioids for pain relief.    No issues of abuse or addiction were noted by provider.      INVESTIGATIONS:  MRI scan of the thorcic spine done on 11/16 was reviewed. Some changes    Physical Exam   Constitutional: She is oriented to person, place, and time. She appears well-developed and well-nourished.   Pulmonary/Chest: Effort normal.   Neurological: She is alert and oriented to person, place, and time.   Skin: Skin is warm and dry.   Psychiatric: She has a normal mood and affect. Her behavior is normal. Judgment and thought content normal.     PHYSICAL EXAMINATION:(THORACIC)  The patient has  scars.  Has no scoliosis.  Has no kyphosis.  Has  tenderness in the spinous process from T1 to T 9.   Has  tenderness in the paraspinal muscles on both side.        1. Disc displacement, thoracic    2. Chronic thoracic back pain, unspecified back pain laterality        PLAN:    1. PT does not NEED PROCEDURE.  2. CONTINUE OPIOIDS.  PT IS A [facility] CANDIDATE FOR OPIATE THERAPY.    3.Follow up 2 months.  4. Denies feeling has addiction to  medication  5. OD precautions:  Patient was given RX for naloxone 2/19  Have you filled RX    N/a  Have you used RX n/a  Does patient need new RX n/a               CARE SHEET    OARRS 2/19 prescription history reviewed, and was as expected.  UDS      PROCEDURES:   PTNI  OPIOIDS: Patient is a good candidate for opioids.  I will titrate opioids up to a certain extent if needed.   MEDICATIONS: ON EFFEXOR AND EFFIENT AND ASA AND XANAX AND TRAZ. FAILED NSAIDS AND TYL [FAILED BOTH 10/18] AND UNLFS.  PHYSICAL THERAPY: PTNI  CHIROPRACTOR: PTNI  SURGERY:  AS A LAST RESORT   TENS:   PAIN PSYCHOLOGY: PT NOT INTERESTED   INVESTIGATIONS: MRI-T 11/16 some chgs,    UDS:      No ref. provider found  Toshiye Kever Dione Housekeeper SLOUGH, MD    [facility]

## 2018-01-27 NOTE — Care Coordination-Inpatient (Signed)
[place] call to f/u with pt. States that she is doing well, but on way to another appointment. States she had a "skin cancer" removed. Currently denies need for ACC.      POC: call x1 month, if no ACC needs, close episode of care

## 2018-02-01 ENCOUNTER — Encounter

## 2018-02-02 MED ORDER — OMEPRAZOLE 40 MG PO CPDR
40 MG | ORAL_CAPSULE | ORAL | 0 refills | Status: DC
Start: 2018-02-02 — End: 2018-05-12

## 2018-02-07 ENCOUNTER — Encounter: Payer: PRIVATE HEALTH INSURANCE | Attending: Family | Primary: Sports Medicine

## 2018-02-16 MED ORDER — FLUOCINONIDE 0.05 % EX SOLN
0.05 % | CUTANEOUS | 1 refills | Status: DC
Start: 2018-02-16 — End: 2019-03-20

## 2018-02-16 NOTE — Telephone Encounter (Signed)
Patient called and needs fluocinonide (LIDEX) 0.05 % external solution sent to [facility]    Call back # [phone]

## 2018-02-24 MED ORDER — MONTELUKAST SODIUM 10 MG PO TABS
10 MG | ORAL_TABLET | ORAL | 3 refills | Status: DC
Start: 2018-02-24 — End: 2019-02-24

## 2018-03-01 ENCOUNTER — Ambulatory Visit
Admit: 2018-03-01 | Discharge: 2018-03-01 | Payer: PRIVATE HEALTH INSURANCE | Attending: Dermatology | Primary: Sports Medicine

## 2018-03-01 DIAGNOSIS — D224 Melanocytic nevi of scalp and neck: Secondary | ICD-10-CM

## 2018-03-01 NOTE — Progress Notes (Signed)
[facility]  Antonietta Breach, MD  [place]  1956/10/08    62 y.o. female     Date of Visit: 03/01/2018    Chief Complaint: skin lesions, follow up for melanoma    History of Present Illness:    1.  She presents today for recently bleeding and tender lesion on the neck.    2.  She also points of multiple pigmented lesions on the upper trunk.    3.  She completed to multiple lesions on the eyelids and neck.    4.  She has a recent history of a stage Ia superficial spreading malignant melanoma (0.5 mm) on the right central upper back status post wide local excision by Dr. Caryn Section on 12/29/2017.  [place] class 1A signature.      No family history of melanoma.   No tanning bed use.   Has history of sunburns on the back.      Review of Systems:  Skin: No new or changing moles.    Past Medical History, Family History, Surgical History, Medications and Allergies reviewed.    Past Medical History:   Diagnosis Date   ??? Anxiety    ??? Asthma    ??? Blood circulation, collateral    ??? CAD (coronary artery disease)    ??? CHF (congestive heart failure) ([place])    ??? Chronic back pain    ??? Deep vein thrombosis    ??? 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 Depression    ??? GERD (gastroesophageal reflux disease)     NO LONGER SINCE LAP   ??? GERD (gastroesophageal reflux disease) 01/23/2009   ??? Hx of blood clots    ??? Hyperlipidemia     hx; resolved with lap band   ??? Hypertension    ??? Idiopathic peripheral neuropathy 03/26/2017   ??? Melanoma ([place])     upper back   ??? Obesity     hx of; had lap band   ??? Obstructive sleep apnea (adult) (pediatric) 09/18/2015   ??? Pulmonary embolism [facility])      Past Surgical History:   Procedure Laterality Date   ??? BACK SURGERY      neck  plates   ??? BREAST SURGERY      left lumpectomy   ??? CESAREAN SECTION     ??? CHOLECYSTECTOMY     ??? COLONOSCOPY     ??? COLONOSCOPY  04/08/10   ??? CORONARY ANGIOPLASTY     ??? DIAGNOSTIC CARDIAC CATH LAB PROCEDURE     ??? DILATATION,  ESOPHAGUS     ??? ENDOSCOPY, COLON, DIAGNOSTIC     ??? HYSTERECTOMY     ??? LAP BAND  05/01/08    Dr. Nicole Cella   ??? OTHER SURGICAL HISTORY      [place] Filter: curently in place   ??? OTHER SURGICAL HISTORY  07/05/13    lap band removal   ??? PARTIAL HYSTERECTOMY     ??? SHOULDER SURGERY     ??? SKIN CANCER EXCISION  12/29/2017   ??? UPPER GASTROINTESTINAL ENDOSCOPY  05/19/13   ??? UPPER GASTROINTESTINAL ENDOSCOPY  07/21/13    ESOPHAGEAL STENT PLACEMENT   ??? UPPER GASTROINTESTINAL ENDOSCOPY N/A 07/31/2014    Esophagogastroduodenoscopy with esophageal balloon dilation   ??? VASCULAR SURGERY  04/2015    Lucendia Herrlich, celiac artery angiogram, normal abd arteries       Allergies   Allergen Reactions   ??? Adhesive Tape  Local itching   ??? Morphine Itching     Per PT she has had recently w/no reaction 12/16/17   ??? Seasonal    ??? Talwin [Pentazocine] Other (See Comments)     dizzy     Outpatient Medications Marked as Taking for the 03/01/18 encounter (Office Visit) with Francisca December, MD   Medication Sig Dispense Refill   ??? montelukast (SINGULAIR) 10 MG tablet TAKE 1 TABLET BY MOUTH ONE TIME A DAY 90 tablet 3   ??? fluocinonide (LIDEX) 0.05 % external solution APPLY SPARINGLY TO THE BACK OF THE SCALP DAILY IF NEEDED FOR ITCHING 60 mL 1   ??? omeprazole (PRILOSEC) 40 MG delayed release capsule TAKE 1 CAPSULE BY MOUTH ONE TIME A DAY 90 capsule 0   ??? oxyCODONE-acetaminophen (PERCOCET) 5-325 MG per tablet Take 1 tablet by mouth 3 times daily for 30 days.Esther Hardy Date: 02/16/18 90 tablet 0   ??? naloxone 4 MG/0.1ML LIQD nasal spray 1 spray by Nasal route as needed for Opioid Reversal 1 each 0   ??? nitroGLYCERIN (NITROSTAT) 0.4 MG SL tablet up to max of 3 total doses. If no relief after 1 dose, call 911. 25 tablet 3   ??? gabapentin (NEURONTIN) 300 MG capsule Take 1 capsule by mouth nightly.. 90 capsule 3   ??? spironolactone (ALDACTONE) 25 MG tablet Take 1.5 tablets by mouth daily 135 tablet 0   ??? lisinopril (PRINIVIL;ZESTRIL) 40 MG tablet TAKE ONE TABLET BY  MOUTH ONE TIME A DAY 90 tablet 1   ??? ALPRAZolam (XANAX) 0.25 MG tablet Take 1 tablet by mouth 3 times daily as needed for Sleep.. 30 tablet 0   ??? zoster recombinant adjuvanted vaccine (SHINGRIX) 50 MCG SUSR injection .62mL IM X 1 followed by .5mL IM 2-6 months after dose #1 0.5 mL 0   ??? ranolazine (RANEXA) 500 MG extended release tablet Take 1 tablet by mouth 2 times daily 180 tablet 3   ??? metoprolol (LOPRESSOR) 100 MG tablet TAKE 1 AND 1/2 TABLET BY MOUTH TWO TIMES A DAY 270 tablet 1   ??? prasugrel (EFFIENT) 10 MG TABS TAKE 1 TABLET BY MOUTH DAILY 90 tablet 3   ??? aspirin 81 MG EC tablet Take 1 tablet by mouth daily 30 tablet 11   ??? traZODone (DESYREL) 100 MG tablet TAKE ONE TABLET BY MOUTH NIGHTLY 90 tablet 3   ??? venlafaxine (EFFEXOR XR) 150 MG extended release capsule Take 1 capsule by mouth daily 90 capsule 3   ??? torsemide (DEMADEX) 20 MG tablet Take 2 tablets by mouth daily (Patient taking differently: Take 20 mg by mouth daily Takes an extra dose for a wt gain > 3#) 180 tablet 3   ??? triamcinolone (KENALOG) 0.1 % cream Apply topically 2 times daily.Apply with Q tip in each ear BID for 1 week. 15 g 1   ??? mupirocin (BACTROBAN) 2 % ointment Apply topically  Daily.Apply with Q tip in each naris at night 15 g 0   ??? oxyCODONE-acetaminophen (PERCOCET) 5-325 MG per tablet Take 1 tablet by mouth every 6 hours as needed for Pain . (Patient taking differently: Take 1 tablet by mouth every 8 hours as needed for Pain. Marland Kitchen) 40 tablet 0   ??? albuterol sulfate HFA (PROVENTIL HFA) 108 (90 BASE) MCG/ACT inhaler Inhale 2 puffs into the lungs every 6 hours as needed for Wheezing 3 Inhaler 3   ??? Multiple Vitamins-Minerals (MULTIVITAMIN PO) Take 1 tablet by mouth daily      ???  CALCIUM-VITAMIN D PO Take 1 tablet by mouth daily      ??? cetirizine (ZYRTEC ALLERGY) 10 MG tablet Take 10 mg by mouth daily.           Physical Examination       The following were examined and determined to be normal: Psych/Neuro, Scalp/hair, Head/face,  Conjunctivae/eyelids, Gums/teeth/lips, Breast/axilla/chest, Abdomen, Back, RUE, LUE, RLE, LLE and Nails/digits.    The following were examined and determined to be abnormal: Neck.     Well-appearing.    1.  Left anterior lateral neck with a 5 mm round erythematous brown papule.        2.  Upper aspect of the chest and shoulders with round and slightly irregularly shaped smooth brown macules.    3.  Upper eyelids, neck with a pedunculated skin-colored papules.    4.  Central upper back with a large linear surgical scar.  There is no cervical or supraclavicular LAD.         Assessment and Plan     1. Irritated nevus of neck - 5 mm    The lesion to be removed was marked with a surgical pen.  Alcohol was used to cleanse the site. Local anesthesia was acheived with 1% lidocaine with epinephrine. Shave removal of the lesion was performed down to mid dermis using a razor blade.  Hemostasis was achieved with aluminum chloride. The wound was dressed with petrolatum and covered with a bandage.  Wound care instructions were reviewed.  1 Specimen (s) sent to pathology.  The specimen bottle(s) were appropriately labeled.  We also reviewed the risks of bleeding, scar, and infection      2. Solar lentigines    Encouraged sun protective behaviors.      3. Cutaneous skin tags     Reassurance.      4. History of stage IA melanoma of the central upper back - no signs of recurrence, well healed after surgery    I encouraged monthly self skin examinations and to call with any new or concerning lesions.  Continue sun protective behaviors.  Return for full skin examination in the next 4-6 months.           Return in about 4 months (around 07/01/2018).

## 2018-03-01 NOTE — Patient Instructions (Signed)
Biopsy Wound Care Instructions    ?? Keep the bandage in place for 24 hours.   ?? Cleanse the wound with mild soapy water daily  ??? Gently dry the area.  ??? Apply Vaseline or petroleum jelly to the wound using a cotton tipped applicator.  ??? Cover with a clean bandage.  ??? Repeat this process until the biopsy site is healed.  ??? If you had stitches placed, continue treating the site until the stitches are removed. Remember to make an appointment to return to have your stitches removed by our staff.  ??? You may shower and bathe as usual.       ** Biopsy results generally take around 7 business days to come back.  If you have not heard from us by then, please call the office at [phone] between 8AM and 4PM Monday through Friday.

## 2018-03-04 LAB — DERMATOLOGY PATHOLOGY

## 2018-03-10 NOTE — Telephone Encounter (Signed)
I agree with the Care Coordinator's Plan of Care

## 2018-03-16 ENCOUNTER — Encounter: Attending: Internal Medicine | Primary: Sports Medicine

## 2018-03-16 ENCOUNTER — Encounter: Attending: Interventional Pain Medicine | Primary: Sports Medicine

## 2018-03-17 ENCOUNTER — Ambulatory Visit
Admit: 2018-03-17 | Discharge: 2018-03-17 | Payer: PRIVATE HEALTH INSURANCE | Attending: Family Medicine | Primary: Sports Medicine

## 2018-03-17 DIAGNOSIS — R5383 Other fatigue: Secondary | ICD-10-CM

## 2018-03-17 MED ORDER — METOPROLOL TARTRATE 100 MG PO TABS
100 MG | ORAL_TABLET | ORAL | 1 refills | Status: DC
Start: 2018-03-17 — End: 2018-07-04

## 2018-03-17 NOTE — Progress Notes (Signed)
Subjective:      Patient ID: Rachael Mays is a 62 y.o. female.    HPI     Tired x 3 months. Sleeps 7-9 hours nightly. Uses bipap for OSA. Settings last checked about a year ago. Could nap daily. Concerned about falling asleep while driving.    Review of sedating medication--Takes Neurontin nightly. Takes xanax very rarely (none in a monthly). Takes metoprolol. Takes percocet bid.     Emotional today as divorce will be finalized tomorrow. She initiated the divorce but still sad about how everything turned out. Kids and friend are supportive.    Recently had melanoma removed.    Review of Systems    Patient Active Problem List   Diagnosis   ??? Back pain   ??? Deep vein thrombosis ([place])   ??? Depression   ??? Pulmonary embolism ([place])   ??? Nausea & vomiting   ??? Abdominal  pain, other specified site   ??? Morbid obesity with BMI of 40.0-44.9, adult ([place])   ??? Status post gastric banding   ??? Vertigo   ??? Dizziness   ??? Tinnitus, subjective   ??? Other complications of gastric band procedure   ??? Essential hypertension   ??? Hyperglycemia   ??? Atypical chest pain   ??? 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 Adrenal mass, left ([place])   ??? Adrenal nodule ([place])   ??? Obstructive sleep apnea   ??? Dyspnea and respiratory abnormalities   ??? Abnormal stress test   ??? Shortness of breath   ??? Chronic diastolic congestive heart failure ([place])   ??? Coronary artery disease due to lipid rich plaque   ??? Pulmonary hypertension   ??? 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 Chest pain   ??? Acute actinic otitis externa of both ears   ??? Anxiety and depression   ??? Bradycardia   ??? Dyslipidemia   ??? Chronic eczematous otitis externa of both ears   ??? Mucositis   ??? Sprain of radiocarpal joint of right wrist   ??? Idiopathic peripheral neuropathy   ??? Chest pain in adult   ??? Hypoxia   ??? Chronic pain syndrome   ??? Unstable angina (HCC)   ??? Degeneration of thoracic intervertebral disc   ??? Displacement of thoracic intervertebral disc without  myelopathy   ??? Visit for suture removal       Outpatient Medications Marked as Taking for the 03/17/18 encounter (Office Visit) with Harvel Quale, MD   Medication Sig Dispense Refill   ??? montelukast (SINGULAIR) 10 MG tablet TAKE 1 TABLET BY MOUTH ONE TIME A DAY 90 tablet 3   ??? fluocinonide (LIDEX) 0.05 % external solution APPLY SPARINGLY TO THE BACK OF THE SCALP DAILY IF NEEDED FOR ITCHING 60 mL 1   ??? omeprazole (PRILOSEC) 40 MG delayed release capsule TAKE 1 CAPSULE BY MOUTH ONE TIME A DAY 90 capsule 0   ??? gabapentin (NEURONTIN) 300 MG capsule Take 1 capsule by mouth nightly.. 90 capsule 3   ??? spironolactone (ALDACTONE) 25 MG tablet Take 1.5 tablets by mouth daily 135 tablet 0   ??? lisinopril (PRINIVIL;ZESTRIL) 40 MG tablet TAKE ONE TABLET BY MOUTH ONE TIME A DAY 90 tablet 1   ??? ALPRAZolam (XANAX) 0.25 MG tablet Take 1 tablet by mouth 3 times daily as needed for Sleep.. 30 tablet 0   ??? ranolazine (RANEXA) 500 MG extended release tablet Take 1 tablet by mouth 2  times daily 180 tablet 3   ??? metoprolol (LOPRESSOR) 100 MG tablet TAKE 1 AND 1/2 TABLET BY MOUTH TWO TIMES A DAY 270 tablet 1   ??? aspirin 81 MG EC tablet Take 1 tablet by mouth daily 30 tablet 11   ??? mupirocin (BACTROBAN) 2 % ointment Apply topically  Daily.Apply with Q tip in each naris at night 15 g 0   ??? oxyCODONE-acetaminophen (PERCOCET) 5-325 MG per tablet Take 1 tablet by mouth every 6 hours as needed for Pain . (Patient taking differently: Take 1 tablet by mouth every 8 hours as needed for Pain. Marland Kitchen) 40 tablet 0   ??? albuterol sulfate HFA (PROVENTIL HFA) 108 (90 BASE) MCG/ACT inhaler Inhale 2 puffs into the lungs every 6 hours as needed for Wheezing 3 Inhaler 3   ??? Multiple Vitamins-Minerals (MULTIVITAMIN PO) Take 1 tablet by mouth daily      ??? CALCIUM-VITAMIN D PO Take 1 tablet by mouth daily      ??? cetirizine (ZYRTEC ALLERGY) 10 MG tablet Take 10 mg by mouth daily.         Allergies   Allergen Reactions   ??? Adhesive Tape      Local itching   ???  Morphine Itching     Per PT she has had recently w/no reaction 12/16/17   ??? Seasonal    ??? Talwin [Pentazocine] Other (See Comments)     dizzy       Social History     Tobacco Use   ??? Smoking status: Former Smoker     Packs/day: 0.50     Years: 40.00     Pack years: 20.00     Types: Cigarettes     Last attempt to quit: 08/07/2012     Years since quitting: 5.6   ??? Smokeless tobacco: Former Systems developer     Quit date: 06/16/2013   ??? Tobacco comment: started to smoke at age 70 / only smoked 0.5 p.p.d    Substance Use Topics   ??? Alcohol use: No     Alcohol/week: 0.0 oz       Objective:   BP 138/70    Pulse 62    Wt (!) 306 lb (138.8 kg)    SpO2 96%    BMI 49.39 kg/m??     Physical Exam   Constitutional: She is oriented to person, place, and time. She appears well-developed and well-nourished. No distress.   Cardiovascular: Normal rate, regular rhythm and normal heart sounds.   No murmur heard.  Pulmonary/Chest: Effort normal and breath sounds normal. No stridor. No respiratory distress. She has no rales.   Neurological: She is alert and oriented to person, place, and time.   Psychiatric: She has a normal mood and affect. Her behavior is normal.     Assessment:      Diagnosis Orders   1. Other fatigue  TSH with Reflex    CBC WITH AUTO DIFFERENTIAL   2. 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   Needs bipap reviewed.  Dicussed sedating medications. Would hold gabapentin to see if this is contributing. Discussed that Percocet could also be contributing. Would use this as infrequently as possible.  Cautioned about driving and discussed that if she is feeling sleepy she should not drive.

## 2018-03-17 NOTE — Progress Notes (Signed)
Patient complains of fatigue for three months.  It is gradually worsening.  Patient is not currently taking any OTC medications.

## 2018-03-24 ENCOUNTER — Encounter: Attending: Interventional Pain Medicine | Primary: Sports Medicine

## 2018-03-31 ENCOUNTER — Encounter

## 2018-03-31 MED ORDER — SPIRONOLACTONE 25 MG PO TABS
25 MG | ORAL_TABLET | ORAL | 0 refills | Status: DC
Start: 2018-03-31 — End: 2019-02-24

## 2018-03-31 NOTE — Telephone Encounter (Signed)
Needs an appt for additional refills

## 2018-03-31 NOTE — Telephone Encounter (Signed)
Last ov: 05/12/17  Last labs:   Next appointment:    LM for pt that yearly is coming up and that she is due for labs.

## 2018-04-04 NOTE — Telephone Encounter (Signed)
LM for patient to call for appt for further refills

## 2018-04-05 NOTE — Telephone Encounter (Signed)
LM for pt to call to sch fu for medication refills. JAT

## 2018-04-05 NOTE — Telephone Encounter (Signed)
From: Luiz Iron, APRN - CNP   Sent: 03/31/2018 ?? 6:38 PM   To: Coralie Carpen, MA     Steph, spironolactone was given as #135 (3 months) when the pt hasn't been here since June '18. She needs an ov !   Dineen Kid for spironolactone.[birth year]       LM for pt to call to sch yrly fu/medication refills appt with NPT per NPT msg above. JAT

## 2018-04-06 NOTE — Telephone Encounter (Signed)
Lm for pt to call to schedule fu with NPTS. nms

## 2018-04-07 NOTE — Telephone Encounter (Signed)
lmom to call to sched

## 2018-04-08 NOTE — Telephone Encounter (Signed)
Pt called multiple times without response.  Closing encounter.  mg

## 2018-04-08 NOTE — Telephone Encounter (Signed)
I spoke with pt and relayed message per NPTS. I also told her about [facility] and down load the ap to help with the cost.

## 2018-04-08 NOTE — Telephone Encounter (Signed)
It was probably given for BP control ; if she can't see [place], then have her f/u with her PCP

## 2018-04-08 NOTE — Telephone Encounter (Signed)
Pt called multiple times without response. Closing encounter. mg

## 2018-04-08 NOTE — Telephone Encounter (Signed)
She just got divorced and now has no insurance so cant afford to come in for office visit . NPST wanted her to make appt because her spironolactone was refilled . Patient wants to know how important is it that she takes the spironolactone ? What does it do ? Can she do without it for a while ? She has been working that is why she has not called sooner .

## 2018-04-14 ENCOUNTER — Encounter: Attending: Dermatology | Primary: Sports Medicine

## 2018-04-29 ENCOUNTER — Ambulatory Visit
Admit: 2018-04-29 | Discharge: 2018-05-03 | Payer: PRIVATE HEALTH INSURANCE | Attending: Interventional Pain Medicine | Primary: Sports Medicine

## 2018-04-29 DIAGNOSIS — M5414 Radiculopathy, thoracic region: Secondary | ICD-10-CM

## 2018-04-29 MED ORDER — OXYCODONE-ACETAMINOPHEN 5-325 MG PO TABS
5-325 MG | ORAL_TABLET | Freq: Three times a day (TID) | ORAL | 0 refills | Status: DC
Start: 2018-04-29 — End: 2018-07-06

## 2018-04-29 MED ORDER — OXYCODONE-ACETAMINOPHEN 5-325 MG PO TABS
5-325 MG | ORAL_TABLET | Freq: Three times a day (TID) | ORAL | 0 refills | Status: DC
Start: 2018-04-29 — End: 2018-06-16

## 2018-04-29 NOTE — Progress Notes (Signed)
[place]  September 11, 1956  04/29/18    Chief Complaint   Patient presents with   ??? Back Pain       Social History     Socioeconomic History   ??? Marital status: Married     Spouse name: [name]   ??? Number of children: 2   ??? Years of education: 72   ??? Highest education level: None   Occupational History   ??? None   Social Needs   ??? Financial resource strain: None   ??? Food insecurity:     Worry: None     Inability: None   ??? Transportation needs:     Medical: None     Non-medical: None   Tobacco Use   ??? Smoking status: Former Smoker     Packs/day: 0.50     Years: 40.00     Pack years: 20.00     Types: Cigarettes     Last attempt to quit: 08/07/2012     Years since quitting: 5.7   ??? Smokeless tobacco: Former Systems developer     Quit date: 06/16/2013   ??? Tobacco comment: started to smoke at age 7 / only smoked 0.5 p.p.d    Substance and Sexual Activity   ??? Alcohol use: No     Alcohol/week: 0.0 oz   ??? Drug use: No   ??? Sexual activity: Never   Lifestyle   ??? Physical activity:     Days per week: None     Minutes per session: None   ??? Stress: None   Relationships   ??? Social connections:     Talks on phone: None     Gets together: None     Attends religious service: None     Active member of club or organization: None     Attends meetings of clubs or organizations: None     Relationship status: None   ??? Intimate partner violence:     Fear of current or ex partner: None     Emotionally abused: None     Physically abused: None     Forced sexual activity: None   Other Topics Concern   ??? None   Social History Narrative    ** Merged History Encounter **          Past Medical History:   Diagnosis Date   ??? Anxiety    ??? Asthma    ??? Blood circulation, collateral    ??? CAD (coronary artery disease)    ??? CHF (congestive heart failure) ([place])    ??? Chronic back pain    ??? Deep vein thrombosis    ??? 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 Depression    ??? GERD (gastroesophageal reflux disease)     NO  LONGER SINCE LAP   ??? GERD (gastroesophageal reflux disease) 01/23/2009   ??? Hx of blood clots    ??? Hyperlipidemia     hx; resolved with lap band   ??? Hypertension    ??? Idiopathic peripheral neuropathy 03/26/2017   ??? Melanoma ([place])     upper back   ??? Obesity     hx of; had lap band   ??? Obstructive sleep apnea (adult) (pediatric) 09/18/2015   ??? Pulmonary embolism (HCC)      HISTORY OF PRESENT ILLNESS:   The patient reports pain to the thoracic spine.  Pain is reported from T 1 to T7 on across side. Right =  left.     Rates pain as 7/10 on NRS,  constant.  Describes as:  Aching  yes  Burning    Stabbing   Cramping    Sharp    Dull    Deep    Throbbing    Shooting yes  Electric     Pins and Needles      Recent excision melanoma upper thoracic back, per Dr Cammy Brochure, sutures just removed    Review of Systems   Constitutional: Negative for fever and unexpected weight change.   Psychiatric/Behavioral: Negative for agitation.       REVIEW OF SYSTEMS:  Genitourinary:  There are not accidents of thebladder.  Gastrointestinal:  There are not accidents of the bowel.    NEUROLOGICAL:    The patient does  report numbness to both lower extremity.  The patient does  report tingling to  both lower extremity.  The patient does  report weakness to  both lower extremity.    OPIOID MEDICATION HISTORY:  The patient is currently prescribed opioid medication.  The patient reports that the opioid medication improves their ability to perform  :  Personal care:  yes  House work:  yes  Physical activity: yes  Shop:  yes  Social activity:  yes  Work : yes    Pt reports no side effects from their opioid medication, except constipation controlled.    The patient reports that the medication is helping their pain. The medication is helping them to be more functional.  They need opioids for pain relief.    No issues of abuse or addiction were noted by provider.      INVESTIGATIONS:  MRI scan of the thorcic spine done on 11/16 was reviewed. Some  changes    Physical Exam   Constitutional: She is oriented to person, place, and time. She appears well-developed and well-nourished.   Pulmonary/Chest: Effort normal.   Neurological: She is alert and oriented to person, place, and time.   Skin: Skin is warm and dry.   Psychiatric: She has a normal mood and affect. Her behavior is normal. Judgment and thought content normal.     PHYSICAL EXAMINATION:(THORACIC)  The patient has  scars.  Has no scoliosis.  Has no kyphosis.  Has  tenderness in the spinous process from T1 to T 9.   Has  tenderness in the paraspinal muscles on both side.     PE LUM NORMAL 5/19       No diagnosis found.    PLAN: ASKED TO D/C N AND SEE    C/O WEAKNESS IN LEG X 5Y. NONE ON PE. WILL GET SX REFERRAL.  IF NO SX, WILL GET MRI L AND LESI  Patient doesnt want any injections at this time. Getting pain relief with opioids without sideeffects and making her more functional.   PT ON PRN BENZOS AND NAB AND DWDD AND DFGA AND OARRS GOOD 5/19 . OPIOIDS HELPING W/O SIDEEFFECTS AND HELPING TO BE MORE FUNCTIONAL.        1. PT does not NEED PROCEDURE.  2. CONTINUE OPIOIDS.  PT IS A [employer] CANDIDATE FOR OPIATE THERAPY.    3.Follow up 2 months.  4. Denies feeling has addiction to medication  5. OD precautions:  Patient was given RX for naloxone 2/19  Have you filled RX    N/a  Have you used RX n/a  Does patient need new RX n/a  CARE SHEET    OARRS 2/19 prescription history reviewed, and was as expected.  UDS      PROCEDURES:   PTNI  OPIOIDS: Patient is a good candidate for opioids.  I will titrate opioids up to a certain extent if needed.   MEDICATIONS: ON EFFEXOR AND EFFIENT AND ASA AND XANAX AND TRAZ. FAILED NSAIDS AND TYL [FAILED BOTH 10/18] AND UNLFS.  PHYSICAL THERAPY: PTNI  CHIROPRACTOR: PTNI  SURGERY:  AS A LAST RESORT   TENS:   PAIN PSYCHOLOGY: PT NOT INTERESTED   INVESTIGATIONS: MRI-T 11/16 some chgs,    UDS:      No ref. provider found  TERESA Dione Housekeeper SLOUGH, MD    [place] [facility]

## 2018-05-12 ENCOUNTER — Encounter

## 2018-05-12 MED ORDER — OMEPRAZOLE 40 MG PO CPDR
40 MG | ORAL_CAPSULE | ORAL | 0 refills | Status: DC
Start: 2018-05-12 — End: 2018-08-11

## 2018-05-17 NOTE — Telephone Encounter (Signed)
Dr. Murrell Converse recommends patient to follow-up with her cardiologist if her congestive heart failure is worsening.  Left message for patient to call back.

## 2018-05-18 NOTE — Telephone Encounter (Signed)
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 She states that she is needing the FMLA paperwork just for if she needs to take off after one of these bouts. She states that she just needs this to protect herself. She states that she is trying to work and doesn't want to sit around at home all time.     Please advise.     Patient is aware provider is out of the office for the rest of the week.

## 2018-05-21 NOTE — Telephone Encounter (Signed)
This is a reason for an appt. Please have her schedule.

## 2018-05-23 NOTE — Telephone Encounter (Signed)
Please see below, not sure how TS likes her schedule.    Thanks.

## 2018-05-23 NOTE — Telephone Encounter (Signed)
Patient is scheduled

## 2018-05-23 NOTE — Telephone Encounter (Signed)
Left message for patient to call back.

## 2018-05-23 NOTE — Telephone Encounter (Signed)
Left message for patient to call back.  Patient needs to schedule appointment so Dr. Murrell Converse can fill out disability forms.

## 2018-05-25 ENCOUNTER — Encounter

## 2018-05-26 MED ORDER — LISINOPRIL 40 MG PO TABS
40 MG | ORAL_TABLET | ORAL | 1 refills | Status: DC
Start: 2018-05-26 — End: 2018-11-28

## 2018-06-02 MED ORDER — PRASUGREL HCL 10 MG PO TABS
10 MG | ORAL_TABLET | ORAL | 1 refills | Status: DC
Start: 2018-06-02 — End: 2018-12-29

## 2018-06-02 NOTE — Telephone Encounter (Signed)
Lov 05/12/2017  Labs 05/07/2017  Lrf 03/17/2018 Disp 90+3  Appt No appt scheduled   Please advise thanks

## 2018-06-06 ENCOUNTER — Inpatient Hospital Stay
Admit: 2018-06-06 | Discharge: 2018-06-06 | Disposition: A | Discharge status: Home / Self Care | Payer: PRIVATE HEALTH INSURANCE | Arrived: WI

## 2018-06-06 DIAGNOSIS — T1490XA Injury, unspecified, initial encounter: Secondary | ICD-10-CM

## 2018-06-06 MED ORDER — PREDNISONE 10 MG PO TABS
10 MG | ORAL_TABLET | ORAL | 0 refills | Status: AC
Start: 2018-06-06 — End: 2018-06-16

## 2018-06-06 MED ORDER — OXYCODONE-ACETAMINOPHEN 5-325 MG PO TABS
5-325 MG | Freq: Once | ORAL | Status: AC
Start: 2018-06-06 — End: 2018-06-06
  Administered 2018-06-06: 22:00:00 1 via ORAL

## 2018-06-06 MED FILL — OXYCODONE-ACETAMINOPHEN 5-325 MG PO TABS: 5-325 mg | ORAL | Qty: 1

## 2018-06-06 NOTE — ED Notes (Signed)
Discharge instructions discussed with pt; verbalized understanding. Discussed all new medications (Prednisone) use and side effects; verbalized understanding. Discussed follow up with [facility]; verbalized understanding. All questions answered. Pt d/c home with all of belongings.        Kathryne Eriksson, RN  06/06/18 1756

## 2018-06-06 NOTE — ED Provider Notes (Signed)
[place] ENCOUNTER        Pt Name: Rachael Mays  MRN: [id number]  [place] 1956-06-25  Date of evaluation: 06/06/2018  Provider: Lurline Idol, APRN - CNP  PCP: Acquanetta Sit SLOUGH, MD    This patient was not seen and evaluated by the attending physician No att. providers found.    CHIEF COMPLAINT       Chief Complaint   Patient presents with   ??? Back Injury     pt injured her lower back, having bilateral lower back pain that runs down the back of left leg. pt injured at work 1 week ago, lifting a 40 lb bag of kitty litter.  pt employed by [employer], [place].       HISTORY OF PRESENT ILLNESS   (Location/Symptom, Timing/Onset,Context/Setting, Quality, Duration, Modifying Factors, Severity)  Note limiting factors.     Rachael Mays is a 62 y.o. female who presents ER with concern for pain to her back.  She reports this occurred 1 week ago.  She was lifting a heavy bag of kidney letter at work and developed back pain.  It is been progressively getting worse with radiation down her left leg.  She has been taking Percocet and muscle relaxers at home which were prescribed by her pain management doctor with no improvement in pain.  No saddle anesthesia, urinary retention, or bowel/bladder incontinence.  She also denies fever, rash, headaches, dizziness, chest pain, shortness of breath, cough, congestion, abdominal pain, nausea, vomiting, diarrhea, constipation, blood in the stool, or painful urination.  One friend/family member at bedside.        Nursing Notes triage note reviewed and agreed with or any disagreements were addressed  in the HPI.    REVIEW OF SYSTEMS    (2-9 systems for level 4, 10 or more for level 5)     Review of Systems   Constitutional: Negative for chills and fever.   HENT: Negative for postnasal drip, rhinorrhea and sore throat.    Eyes: Negative for visual disturbance.   Respiratory: Negative for shortness of breath.     Cardiovascular: Negative for chest pain.   Gastrointestinal: Negative for abdominal pain, blood in stool, constipation, diarrhea, nausea and vomiting.   Genitourinary: Negative for dysuria, flank pain and hematuria.   Musculoskeletal: Positive for back pain.   Skin: Negative for rash.   Neurological: Negative for weakness and headaches.   All other systems reviewed and are negative.      Positives and Pertinent negatives as per HPI.  Except as noted above in the ROS, all other systems were reviewed and negative.       PAST MEDICAL HISTORY     Past Medical History:   Diagnosis Date   ??? Anxiety    ??? Asthma    ??? Blood circulation, collateral    ??? CAD (coronary artery disease)    ??? CHF (congestive heart failure) ([place])    ??? Chronic back pain    ??? Deep vein thrombosis    ??? 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 Depression    ??? GERD (gastroesophageal reflux disease)     NO LONGER SINCE LAP   ??? GERD (gastroesophageal reflux disease) 01/23/2009   ??? Hx of blood clots    ??? Hyperlipidemia     hx; resolved with lap band   ??? Hypertension    ??? Idiopathic peripheral neuropathy 03/26/2017   ??? Melanoma ([place])  upper back   ??? Obesity     hx of; had lap band   ??? Obstructive sleep apnea (adult) (pediatric) 09/18/2015   ??? Pulmonary embolism ([place])          SURGICAL HISTORY       Past Surgical History:   Procedure Laterality Date   ??? BACK SURGERY      neck  plates   ??? BREAST SURGERY      left lumpectomy   ??? CESAREAN SECTION     ??? CHOLECYSTECTOMY     ??? COLONOSCOPY     ??? COLONOSCOPY  04/08/10   ??? CORONARY ANGIOPLASTY     ??? DIAGNOSTIC CARDIAC CATH LAB PROCEDURE     ??? DILATATION, ESOPHAGUS     ??? ENDOSCOPY, COLON, DIAGNOSTIC     ??? HYSTERECTOMY     ??? LAP BAND  05/01/08    Dr. Nicole Cella   ??? OTHER SURGICAL HISTORY      [place] Filter: curently in place   ??? OTHER SURGICAL HISTORY  07/05/13    lap band removal   ??? PARTIAL HYSTERECTOMY     ??? SHOULDER SURGERY     ??? SKIN CANCER EXCISION  12/29/2017   ??? UPPER GASTROINTESTINAL  ENDOSCOPY  05/19/13   ??? UPPER GASTROINTESTINAL ENDOSCOPY  07/21/13    ESOPHAGEAL STENT PLACEMENT   ??? UPPER GASTROINTESTINAL ENDOSCOPY N/A 07/31/2014    Esophagogastroduodenoscopy with esophageal balloon dilation   ??? VASCULAR SURGERY  04/2015    Lucendia Herrlich, celiac artery angiogram, normal abd arteries         CURRENT MEDICATIONS       Discharge Medication List as of 06/06/2018  5:52 PM      CONTINUE these medications which have NOT CHANGED    Details   !! prasugrel (EFFIENT) 10 MG TABS Take 10 mg by mouth dailyHistorical Med      lisinopril (PRINIVIL;ZESTRIL) 40 MG tablet TAKE 1 TABLET BY MOUTH ONE TIME A DAY, Disp-90 tablet, R-1Normal      omeprazole (PRILOSEC) 40 MG delayed release capsule TAKE 1 CAPSULE BY MOUTH ONE TIME A DAY, Disp-90 capsule, R-0Normal      !! oxyCODONE-acetaminophen (PERCOCET) 5-325 MG per tablet Take 1 tablet by mouth 3 times daily for 30 days., Disp-90 tablet, R-0Print      spironolactone (ALDACTONE) 25 MG tablet TAKE ONE AND ONE-HALF TABLETS BY MOUTH EVERY DAY, Disp-45 tablet, R-0Normal      montelukast (SINGULAIR) 10 MG tablet TAKE 1 TABLET BY MOUTH ONE TIME A DAY, Disp-90 tablet, R-3Normal      fluocinonide (LIDEX) 0.05 % external solution APPLY SPARINGLY TO THE BACK OF THE SCALP DAILY IF NEEDED FOR ITCHING, Disp-60 mL, R-1, Normal      nitroGLYCERIN (NITROSTAT) 0.4 MG SL tablet up to max of 3 total doses. If no relief after 1 dose, call 911., Disp-25 tablet, R-3Normal      gabapentin (NEURONTIN) 300 MG capsule Take 1 capsule by mouth nightly.., Disp-90 capsule, R-3Normal      ALPRAZolam (XANAX) 0.25 MG tablet Take 1 tablet by mouth 3 times daily as needed for Sleep.., Disp-30 tablet, R-0Normal      ranolazine (RANEXA) 500 MG extended release tablet Take 1 tablet by mouth 2 times daily, Disp-180 tablet, R-3Normal      !! metoprolol (LOPRESSOR) 100 MG tablet TAKE 1 AND 1/2 TABLET BY MOUTH TWO TIMES A DAY, Disp-270 tablet, R-1Normal      aspirin 81 MG EC tablet Take 1 tablet by mouth daily,  Disp-30  tablet, R-11Normal      triamcinolone (KENALOG) 0.1 % cream Apply topically 2 times daily.Apply with Q tip in each ear BID for 1 week., Disp-15 g, R-1, Normal      albuterol sulfate HFA (PROVENTIL HFA) 108 (90 BASE) MCG/ACT inhaler Inhale 2 puffs into the lungs every 6 hours as needed for Wheezing, Disp-3 Inhaler, R-3      Multiple Vitamins-Minerals (MULTIVITAMIN PO) Take 1 tablet by mouth daily Historical Med      CALCIUM-VITAMIN D PO Take 1 tablet by mouth daily Historical Med      cetirizine (ZYRTEC ALLERGY) 10 MG tablet Take 10 mg by mouth daily.      !! prasugrel (EFFIENT) 10 MG TABS TAKE ONE TABLET BY MOUTH ONE TIME A DAY, Disp-90 tablet, R-1Normal      !! metoprolol (LOPRESSOR) 100 MG tablet TAKE ONE AND ONE-HALF TABLETS BY MOUTH TWO TIMES A DAY, Disp-270 tablet, R-1Normal      zoster recombinant adjuvanted vaccine (SHINGRIX) 50 MCG SUSR injection .43mL IM X 1 followed by .74mL IM 2-6 months after dose #1, Disp-0.5 mL, R-0Print      mupirocin (BACTROBAN) 2 % ointment Apply topically  Daily.Apply with Q tip in each naris at night, Disp-15 g, R-0, Normal      !! oxyCODONE-acetaminophen (PERCOCET) 5-325 MG per tablet Take 1 tablet by mouth every 6 hours as needed for Pain ., Disp-40 tablet, R-0Print       !! - Potential duplicate medications found. Please discuss with provider.            ALLERGIES     Adhesive tape; Morphine; Seasonal; and Talwin [pentazocine]    FAMILY HISTORY       Family History   Problem Relation Age of Onset   ??? Arthritis Mother    ??? Cancer Mother    ??? Depression Mother    ??? High Blood Pressure Mother    ??? Heart Disease Mother 87        MI    ??? Ovarian Cancer Mother 72   ??? Cancer Father 45        colon   ??? Heart Disease Maternal Grandmother    ??? Early Death Paternal Grandmother    ??? Heart Disease Paternal Grandfather    ??? Diabetes Other    ??? High Blood Pressure Other    ??? Obesity Other    ??? Other Sister         OSA   ??? Other Brother         OSA   ??? Other Brother         OSA   ??? Other  Daughter         OSA          SOCIAL HISTORY       Social History     Socioeconomic History   ??? Marital status: Married     Spouse name: [name]   ??? Number of children: 2   ??? Years of education: 7   ??? Highest education level: None   Occupational History   ??? None   Social Needs   ??? Financial resource strain: None   ??? Food insecurity:     Worry: None     Inability: None   ??? Transportation needs:     Medical: None     Non-medical: None   Tobacco Use   ??? Smoking status: Former Smoker     Packs/day: 0.50  Years: 40.00     Pack years: 20.00     Types: Cigarettes     Last attempt to quit: 08/07/2012     Years since quitting: 5.8   ??? Smokeless tobacco: Former Systems developer     Quit date: 06/16/2013   ??? Tobacco comment: started to smoke at age 69 / only smoked 0.5 p.p.d    Substance and Sexual Activity   ??? Alcohol use: No     Alcohol/week: 0.0 oz   ??? Drug use: No   ??? Sexual activity: Never   Lifestyle   ??? Physical activity:     Days per week: None     Minutes per session: None   ??? Stress: None   Relationships   ??? Social connections:     Talks on phone: None     Gets together: None     Attends religious service: None     Active member of club or organization: None     Attends meetings of clubs or organizations: None     Relationship status: None   ??? Intimate partner violence:     Fear of current or ex partner: None     Emotionally abused: None     Physically abused: None     Forced sexual activity: None   Other Topics Concern   ??? None   Social History Narrative    ** Merged History Encounter **            SCREENINGS             PHYSICAL EXAM  (up to 7 for level 4, 8 or more for level 5)     ED Triage Vitals [06/06/18 1658]   BP Temp Temp Source Pulse Resp SpO2 Height Weight   107/71 98.9 ??F (37.2 ??C) Oral 56 14 98 % 5\' 6"  (1.676 m) 300 lb (136.1 kg)       Physical Exam   Constitutional: She is oriented to person, place, and time. She appears well-developed and well-nourished. No distress.   HENT:   Head: Normocephalic and atraumatic.    Eyes: Right eye exhibits no discharge. Left eye exhibits no discharge.   Neck: Normal range of motion.   Cardiovascular: Intact distal pulses.   Pulmonary/Chest: Effort normal. No stridor. No respiratory distress.   Musculoskeletal: Normal range of motion. She exhibits no edema, tenderness or deformity.        Right ankle: She exhibits normal pulse.        Left ankle: She exhibits normal pulse.        Cervical back: Normal.        Thoracic back: Normal.        Lumbar back: Normal.        Right hand: She exhibits normal capillary refill.        Left hand: She exhibits normal capillary refill.   Neurological: She is alert and oriented to person, place, and time. She has normal strength and normal reflexes. She displays normal reflexes. No cranial nerve deficit or sensory deficit. She exhibits normal muscle tone. Coordination and gait normal. GCS eye subscore is 4. GCS verbal subscore is 5. GCS motor subscore is 6.   Reflex Scores:       Patellar reflexes are 2+ on the right side and 2+ on the left side.       Achilles reflexes are 2+ on the right side and 2+ on the left side.  Skin: Skin is warm and dry. She  is not diaphoretic. No pallor.   No evidence of lesions, erythema, ecchymosis, or rash to back.   Psychiatric: She has a normal mood and affect. Her behavior is normal.   Nursing note and vitals reviewed.      DIAGNOSTIC RESULTS   LABS:    Labs Reviewed - No data to display    All other labs werewithin normal range or not returned as of this dictation.    EKG: All EKG's are interpreted by the Emergency Department Physician who either signs or Co-signs this chart in the absence of acardiologist.  Please see their note for interpretation of EKG.      RADIOLOGY:   Interpretation per the Radiologist below, if available at the time of this note:    No orders to display     No results found.      PROCEDURES   Unless otherwise noted below, none     Procedures    CRITICAL CARE TIME      n/a    CONSULTS:  None      EMERGENCY DEPARTMENT COURSE and DIFFERENTIAL DIAGNOSIS/MDM:   Vitals:    Vitals:    06/06/18 1658   BP: 107/71   Pulse: 56   Resp: 14   Temp: 98.9 ??F (37.2 ??C)   TempSrc: Oral   SpO2: 98%   Weight: 300 lb (136.1 kg)   Height: 5\' 6"  (1.676 m)       Rachael Mays was given the following medications:  Medications   oxyCODONE-acetaminophen (PERCOCET) 5-325 MG per tablet 1 tablet (1 tablet Oral Given 06/06/18 1751)       [name] was evaluated in the emergency department with concern for pain in her low back that radiates down her left leg.  Treated with Percocet in the ER. Re-evaluation is reassuring and the patient feels better. Sensory and motor components of lumbosacral plexus exam symmetric and intact.   Differential diagnosis this patient includes lumbosacral radiculopathy, lumbar radiculopathy, para-spinal abscess, cauda equina, urinary tract infection, kidney stone, and lumbar strain or lumbosacral strain. The patient has no fevers or chills, has no bilateral radiculopathy, is not tachycardic or having urinary symptoms at this time. I suspect this is sciatica.  The patient has no fever, tachycardia, pain out of proportion, recent instrumentation, weakness or bilateral radiculopathy and thus infectious etiology such as epidural abscess or discitis is less likely as well as malignancy or pathologic fracture.      Rachael Mays is stable in the ER and safe to follow as an outpatient.  The patient is discharged on the following medications.  They were counseled on how to take the newly prescribed medications:  Discharge Medication List as of 06/06/2018  5:52 PM      START taking these medications    Details   predniSONE (DELTASONE) 10 MG tablet 60 mg po x 5 days then   40 mg po x 2 days then  20 mg po x 2 days then  10 mg po x 2 days total of 11 days, Disp-44 tablet, R-0Print          .    The patient was additionally educated on: Home remedies for back pain    Instructed to  follow-up with:  [place] [address]  [phone]    Schedule an appointment as soon as possible for a visit in 3 days  For a recheck    Return to the  ER for new or worsening symptoms.  This plan was discussed with the patient and all family available in the room at discharge who are all in agreement with the plan.      I evaluated the patient.  The physician was available for consultation as needed.  The patient and / or the family were informed of the results of any tests, a time was given to answer questions, a plan was proposed and they agreed with plan.        FINAL IMPRESSION      1. Occupational injury    2. Left sided sciatica          DISPOSITION/PLAN   DISPOSITION Decision To Discharge 06/06/2018 05:35:25 PM        DISCONTINUED MEDICATIONS:  Discharge Medication List as of 06/06/2018  5:52 PM                   (Please note that portions of this note were completed with a voice recognition program.  Efforts were made to edit the dictations but occasionally words are mis-transcribed.)    Lurline Idol, APRN - CNP (electronically signed)        Lurline Idol, APRN - CNP  06/06/18 1757

## 2018-06-08 NOTE — Telephone Encounter (Signed)
Lov 05/12/2017 RTO in 3 months  Labs 12/14/2017  Lrf 06/02/2018  Appt No appt scheduled  Called and spoke with pharmacy she stated that the patient phone this medication in and this was generated automatically. Disregard request.

## 2018-06-14 ENCOUNTER — Encounter

## 2018-06-14 MED ORDER — OXYCODONE-ACETAMINOPHEN 5-325 MG PO TABS
5-325 MG | ORAL_TABLET | Freq: Three times a day (TID) | ORAL | 0 refills | Status: DC
Start: 2018-06-14 — End: 2018-10-06

## 2018-06-14 NOTE — Telephone Encounter (Signed)
Legit. I received a vcm from Clarise Cruz with [facility] mail order pharmacy. Pt's Rx has expired. Advised her to destroy the Rx and we will send new one. Per Jacinto Reap- last filled Percocet 5/325mg  #90 on 05/16/18. I have created the partial and pharmacy is confirmed. Thanks//JJ 06/14/18

## 2018-06-16 ENCOUNTER — Ambulatory Visit
Admit: 2018-06-16 | Discharge: 2018-06-16 | Payer: PRIVATE HEALTH INSURANCE | Attending: Family Medicine | Primary: Sports Medicine

## 2018-06-16 DIAGNOSIS — R531 Weakness: Secondary | ICD-10-CM

## 2018-06-16 NOTE — Progress Notes (Signed)
Patient presents to discuss FMLA paperwork.

## 2018-06-16 NOTE — Progress Notes (Signed)
Subjective:      Patient ID: Rachael Mays is a 62 y.o. female.    HPI    Patient presents to discuss FMLA paperwork.  She works as a Scientist, water quality.  She reports that she cannot work more than 2 days at a time because this leaves her feeling generally weak and gives her some pain in her legs after prolonged standing.  She is currently being followed by a doctor from [employer] for her back pain.  She does have a history of chest pain, shortness of breath, and hyperlipidemia.  She has followed with cardiology for this although she is overdue for an appointment.  Cardiac testing has overall been very reassuring.    When discussing that patient's symptoms do not necessarily merit FMLA paperwork, patient mentioned that she passed out in the past at work.    Review of Systems    Patient Active Problem List   Diagnosis   ??? Back pain   ??? Deep vein thrombosis ([place])   ??? Depression   ??? Pulmonary embolism ([place])   ??? Nausea & vomiting   ??? Abdominal  pain, other specified site   ??? Morbid obesity with BMI of 40.0-44.9, adult ([place])   ??? Status post gastric banding   ??? Vertigo   ??? Dizziness   ??? Tinnitus, subjective   ??? Other complications of gastric band procedure   ??? Essential hypertension   ??? Hyperglycemia   ??? Atypical chest pain   ??? 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 Adrenal mass, left ([place])   ??? Adrenal nodule ([place])   ??? Obstructive sleep apnea   ??? Dyspnea and respiratory abnormalities   ??? Abnormal stress test   ??? Shortness of breath   ??? Chronic diastolic congestive heart failure ([place])   ??? Coronary artery disease due to lipid rich plaque   ??? Pulmonary hypertension   ??? 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 Chest pain   ??? Acute actinic otitis externa of both ears   ??? Anxiety and depression   ??? Bradycardia   ??? Dyslipidemia   ??? Chronic eczematous otitis externa of both ears   ??? Mucositis   ??? Sprain of radiocarpal joint of right wrist   ??? Idiopathic peripheral neuropathy   ??? Chest  pain in adult   ??? Hypoxia   ??? Chronic pain syndrome   ??? Unstable angina ([facility])   ??? Degeneration of thoracic intervertebral disc   ??? 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 Visit for suture removal   ??? 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 oxyCODONE-acetaminophen (PERCOCET) 5-325 MG per tablet Take 1 tablet by mouth three times daily for 22 days. 66 tablet 0   ??? prasugrel (EFFIENT) 10 MG TABS Take 10 mg by mouth daily     ??? prasugrel (EFFIENT) 10 MG TABS TAKE ONE TABLET BY MOUTH ONE TIME A DAY 90 tablet 1   ??? lisinopril (PRINIVIL;ZESTRIL) 40 MG tablet TAKE 1 TABLET BY MOUTH ONE TIME A DAY 90 tablet 1   ??? omeprazole (PRILOSEC) 40 MG delayed release capsule TAKE 1 CAPSULE BY MOUTH ONE TIME A DAY 90 capsule 0   ??? spironolactone (ALDACTONE) 25 MG tablet TAKE ONE AND ONE-HALF TABLETS BY MOUTH EVERY DAY  45 tablet 0   ??? metoprolol (LOPRESSOR) 100 MG tablet TAKE ONE AND ONE-HALF TABLETS BY MOUTH TWO TIMES A DAY 270 tablet 1   ??? montelukast (SINGULAIR) 10 MG tablet TAKE 1 TABLET BY MOUTH ONE TIME A DAY 90 tablet 3   ??? fluocinonide (LIDEX) 0.05 % external solution APPLY SPARINGLY TO THE BACK OF THE SCALP DAILY IF NEEDED FOR ITCHING 60 mL 1   ??? gabapentin (NEURONTIN) 300 MG capsule Take 1 capsule by mouth nightly.. 90 capsule 3   ??? ALPRAZolam (XANAX) 0.25 MG tablet Take 1 tablet by mouth 3 times daily as needed for Sleep.. 30 tablet 0   ??? ranolazine (RANEXA) 500 MG extended release tablet Take 1 tablet by mouth 2 times daily 180 tablet 3   ??? metoprolol (LOPRESSOR) 100 MG tablet TAKE 1 AND 1/2 TABLET BY MOUTH TWO TIMES A DAY 270 tablet 1   ??? aspirin 81 MG EC tablet Take 1 tablet by mouth daily 30 tablet 11   ??? triamcinolone (KENALOG) 0.1 % cream Apply topically 2 times daily.Apply with Q tip in each ear BID for 1 week. 15 g 1   ??? mupirocin  (BACTROBAN) 2 % ointment Apply topically  Daily.Apply with Q tip in each naris at night 15 g 0   ??? albuterol sulfate HFA (PROVENTIL HFA) 108 (90 BASE) MCG/ACT inhaler Inhale 2 puffs into the lungs every 6 hours as needed for Wheezing 3 Inhaler 3   ??? Multiple Vitamins-Minerals (MULTIVITAMIN PO) Take 1 tablet by mouth daily      ??? CALCIUM-VITAMIN D PO Take 1 tablet by mouth daily      ??? cetirizine (ZYRTEC ALLERGY) 10 MG tablet Take 10 mg by mouth daily.         Allergies   Allergen Reactions   ??? Adhesive Tape      Local itching   ??? Morphine Itching     Per PT she has had recently w/no reaction 12/16/17   ??? Seasonal    ??? Talwin [Pentazocine] Other (See Comments)     dizzy       Social History     Tobacco Use   ??? Smoking status: Former Smoker     Packs/day: 0.50     Years: 40.00     Pack years: 20.00     Types: Cigarettes     Last attempt to quit: 08/07/2012     Years since quitting: 5.8   ??? Smokeless tobacco: Former Systems developer     Quit date: 06/16/2013   ??? Tobacco comment: started to smoke at age 21 / only smoked 0.5 p.p.d    Substance Use Topics   ??? Alcohol use: No     Alcohol/week: 0.0 oz       Objective:   BP 128/64    Pulse 58    Wt (!) 311 lb (141.1 kg)    BMI 50.20 kg/m??     Physical Exam   Constitutional: She is oriented to person, place, and time. She appears well-developed and well-nourished. No distress.   Neurological: She is alert and oriented to person, place, and time.   Psychiatric: She has a normal mood and affect. Her behavior is normal.   Tearful when discussing indications for FMLA paperwork       Assessment:      Diagnosis Orders   1. Generalized weakness       Plan:     Discussed that there is not an acute indication for FMLA paperwork.  Likewise, her symptom of feeling weak after couple of days of working is not an indication for [employer] paperwork.  I offered to do a work-up of her symptom of syncope which she declined.  She prefers to pursue obtaining FMLA paperwork from a different physician.  Unfortunately,  she was upset with this decision.

## 2018-07-04 ENCOUNTER — Ambulatory Visit
Admit: 2018-07-04 | Discharge: 2018-07-04 | Payer: PRIVATE HEALTH INSURANCE | Attending: Dermatology | Primary: Sports Medicine

## 2018-07-04 DIAGNOSIS — L918 Other hypertrophic disorders of the skin: Secondary | ICD-10-CM

## 2018-07-04 MED ORDER — TRIAMCINOLONE ACETONIDE 0.1 % EX CREA
0.1 % | CUTANEOUS | 2 refills | Status: DC
Start: 2018-07-04 — End: 2019-07-06

## 2018-07-04 NOTE — Progress Notes (Signed)
[facility] D[facility]at[facility]  Conley Canal, MD  [phone]      Rachael Mays  05/15/1956    62 y.o. female     Date of Visit: 07/04/2018    Chief Complaint: skin lesion, follow up for melanoma    History of Present Illness:    1.  She complains of a lesion on the right upper eyelid.     2.  She complains of few lesions on the trunk.     3.  She complains of an itchy rash on the umbilical region.     4.  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 Sherrie Mustache on 12/29/2017.  [place] class 1A signature.  She denies any signs of recurrence.      Review of Systems:  Skin: No new or changing moles.    Past Medical History, Family History, Surgical History, Medications and Allergies reviewed.    Past Medical History:   Diagnosis Date   . Anxiety    . Asthma    . Blood circulation, collateral    . CAD (coronary artery disease)    . CHF (congestive heart failure) (HCC)    . Chronic back pain    . Deep vein thrombosis    . Degeneration of thoracic intervertebral disc 10/15/2017   . Depression    . GERD (gastroesophageal reflux disease)     NO LONGER SINCE LAP   . GERD (gastroesophageal reflux disease) 01/23/2009   . Hx of blood clots    . Hyperlipidemia     hx; resolved with lap band   . Hypertension    . Idiopathic peripheral neuropathy 03/26/2017   . Melanoma (HCC)     upper back   . Obesity     hx of; had lap band   . Obstructive sleep apnea (adult) (pediatric) 09/18/2015   . Pulmonary embolism [facility])      Past Surgical History:   Procedure Laterality Date   . BACK SURGERY      neck  plates   . BREAST SURGERY      left lumpectomy   . CESAREAN SECTION     . CHOLECYSTECTOMY     . COLONOSCOPY     . COLONOSCOPY  04/08/10   . CORONARY ANGIOPLASTY     . DIAGNOSTIC CARDIAC CATH LAB PROCEDURE     . DILATATION, ESOPHAGUS     . ENDOSCOPY, COLON, DIAGNOSTIC     . HYSTERECTOMY     . LAP BAND  05/01/08    Dr. Suzi Roots   . OTHER  SURGICAL HISTORY      [place] Filter: curently in place   . OTHER SURGICAL HISTORY  07/05/13    lap band removal   . PARTIAL HYSTERECTOMY     . SHOULDER SURGERY     . SKIN CANCER EXCISION  12/29/2017   . UPPER GASTROINTESTINAL ENDOSCOPY  05/19/13   . UPPER GASTROINTESTINAL ENDOSCOPY  07/21/13    ESOPHAGEAL STENT PLACEMENT   . UPPER GASTROINTESTINAL ENDOSCOPY N/A 07/31/2014    Esophagogastroduodenoscopy with esophageal balloon dilation   . VASCULAR SURGERY  04/2015    Donzetta Sprung, celiac artery angiogram, normal abd arteries       Allergies   Allergen Reactions   . Adhesive Tape      Local itching   . Morphine Itching     Per PT she has had recently  w/no reaction 12/16/17   . Seasonal    . Talwin [Pentazocine] Other (See Comments)     dizzy     Outpatient Medications Marked as Taking for the 07/04/18 encounter (Office Visit) with Ronelle Nigh, MD   Medication Sig Dispense Refill   . triamcinolone (KENALOG) 0.1 % cream Apply to affected area twice daily for up to 2 weeks or until improved. 45 g 2   . oxyCODONE-acetaminophen (PERCOCET) 5-325 MG per tablet Take 1 tablet by mouth three times daily for 22 days. 66 tablet 0   . lisinopril (PRINIVIL;ZESTRIL) 40 MG tablet TAKE 1 TABLET BY MOUTH ONE TIME A DAY 90 tablet 1   . omeprazole (PRILOSEC) 40 MG delayed release capsule TAKE 1 CAPSULE BY MOUTH ONE TIME A DAY 90 capsule 0   . spironolactone (ALDACTONE) 25 MG tablet TAKE ONE AND ONE-HALF TABLETS BY MOUTH EVERY DAY 45 tablet 0   . montelukast (SINGULAIR) 10 MG tablet TAKE 1 TABLET BY MOUTH ONE TIME A DAY 90 tablet 3   . fluocinonide (LIDEX) 0.05 % external solution APPLY SPARINGLY TO THE BACK OF THE SCALP DAILY IF NEEDED FOR ITCHING 60 mL 1   . ALPRAZolam (XANAX) 0.25 MG tablet Take 1 tablet by mouth 3 times daily as needed for Sleep.. 30 tablet 0   . ranolazine (RANEXA) 500 MG extended release tablet Take 1 tablet by mouth 2 times daily 180 tablet 3   . metoprolol (LOPRESSOR) 100 MG tablet TAKE 1 AND 1/2 TABLET BY MOUTH TWO  TIMES A DAY 270 tablet 1   . aspirin 81 MG EC tablet Take 1 tablet by mouth daily 30 tablet 11   . triamcinolone (KENALOG) 0.1 % cream Apply topically 2 times daily.Apply with Q tip in each ear BID for 1 week. 15 g 1   . mupirocin (BACTROBAN) 2 % ointment Apply topically  Daily.Apply with Q tip in each naris at night 15 g 0   . albuterol sulfate HFA (PROVENTIL HFA) 108 (90 BASE) MCG/ACT inhaler Inhale 2 puffs into the lungs every 6 hours as needed for Wheezing 3 Inhaler 3   . Multiple Vitamins-Minerals (MULTIVITAMIN PO) Take 1 tablet by mouth daily      . CALCIUM-VITAMIN D PO Take 1 tablet by mouth daily      . cetirizine (ZYRTEC ALLERGY) 10 MG tablet Take 10 mg by mouth daily.           Physical Examination       The following were examined and determined to be normal: Psych/Neuro, Scalp/hair, Head/face, Conjunctivae/eyelids, Gums/teeth/lips, Neck, Breast/axilla/chest, Back, RUE, LUE, RLE, LLE and Nails/digits.    The following were examined and determined to be abnormal: Abdomen.     Well appearing.    1.  Right upper eyelid - 1 pedunculated brown papule.     2.  Back, upper extremities with few stuck on appearing brown macules and papules.     3.  Periumbilical region - well defined lichenified pink plaque.     4.  Upper back - linear surgical scar.         Assessment and Plan     1. Skin tag     Reassurance.      2. SK (seborrheic keratosis) - multiple    Reassurance.      3. Chronic dermatitis     Triamcinolone 0.1% cream twice daily for up to 2 weeks or until improved.       4. History of stage  IA melanoma on upper back - no signs of recurrence.     Sun protective behaviors and self skin examinations were encouraged.  Call for any new or concerning lesions.           Return in about 6 months (around 01/04/2019).

## 2018-07-06 ENCOUNTER — Ambulatory Visit
Admit: 2018-07-06 | Discharge: 2018-07-06 | Payer: PRIVATE HEALTH INSURANCE | Attending: Nurse Practitioner | Primary: Sports Medicine

## 2018-07-06 MED ORDER — OXYCODONE-ACETAMINOPHEN 5-325 MG PO TABS
5-325 MG | ORAL_TABLET | Freq: Three times a day (TID) | ORAL | 0 refills | Status: DC
Start: 2018-07-06 — End: 2018-09-07

## 2018-07-06 NOTE — Progress Notes (Signed)
[place]  09-11-1956  07/06/18    Chief Complaint   Patient presents with   ??? Back Pain       Social History     Socioeconomic History   ??? Marital status: Married     Spouse name: [name]   ??? Number of children: 2   ??? Years of education: 35   ??? Highest education level: None   Occupational History   ??? None   Social Needs   ??? Financial resource strain: None   ??? Food insecurity:     Worry: None     Inability: None   ??? Transportation needs:     Medical: None     Non-medical: None   Tobacco Use   ??? Smoking status: Former Smoker     Packs/day: 0.50     Years: 40.00     Pack years: 20.00     Types: Cigarettes     Last attempt to quit: 08/07/2012     Years since quitting: 5.9   ??? Smokeless tobacco: Former Systems developer     Quit date: 06/16/2013   ??? Tobacco comment: started to smoke at age 21 / only smoked 0.5 p.p.d    Substance and Sexual Activity   ??? Alcohol use: No     Alcohol/week: 0.0 standard drinks   ??? Drug use: No   ??? Sexual activity: Never   Lifestyle   ??? Physical activity:     Days per week: None     Minutes per session: None   ??? Stress: None   Relationships   ??? Social connections:     Talks on phone: None     Gets together: None     Attends religious service: None     Active member of club or organization: None     Attends meetings of clubs or organizations: None     Relationship status: None   ??? Intimate partner violence:     Fear of current or ex partner: None     Emotionally abused: None     Physically abused: None     Forced sexual activity: None   Other Topics Concern   ??? None   Social History Narrative    ** Merged History Encounter **          Past Medical History:   Diagnosis Date   ??? Anxiety    ??? Asthma    ??? Blood circulation, collateral    ??? CAD (coronary artery disease)    ??? CHF (congestive heart failure) ([place])    ??? Chronic back pain    ??? Deep vein thrombosis    ??? 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 Depression    ??? GERD (gastroesophageal reflux  disease)     NO LONGER SINCE LAP   ??? GERD (gastroesophageal reflux disease) 01/23/2009   ??? Hx of blood clots    ??? Hyperlipidemia     hx; resolved with lap band   ??? Hypertension    ??? Idiopathic peripheral neuropathy 03/26/2017   ??? Melanoma ([place])     upper back   ??? Obesity     hx of; had lap band   ??? Obstructive sleep apnea (adult) (pediatric) 09/18/2015   ??? Pulmonary embolism (HCC)      HISTORY OF PRESENT ILLNESS:   The patient reports pain to the thoracic spine.  Pain is reported from T 1 to T9 on across side.  Right = left.     Rates pain as 7/10 on NRS,  constant.  Describes as:  Aching    Burning yes  Stabbing   Cramping  yes  Sharp    Dull    Deep    Throbbing  yes  Shooting   Electric     Pins and Needles      Recent excision melanoma upper thoracic back, per Dr Cammy Brochure, sutures just removed- doing well.    Review of Systems   Constitutional: Negative for fever and unexpected weight change.   Psychiatric/Behavioral: Negative for agitation.       REVIEW OF SYSTEMS:  Genitourinary:  There are not accidents of thebladder.  Gastrointestinal:  There are not accidents of the bowel.    NEUROLOGICAL:    The patient does  report numbness to both lower extremity.  The patient does  report tingling to  both lower extremity.  The patient does  report weakness to  both lower extremity.    OPIOID MEDICATION HISTORY:  The patient is currently prescribed opioid medication.  The patient reports that the opioid medication improves their ability to perform  :  Personal care:    House work:  yes  Physical activity: yes  Shop:  yes  Social activity:  yes  Work : yes    Pt reports no side effects from their opioid medication, except constipation controlled.    The patient reports that the medication is helping their pain. The medication is helping them to be more functional.  They need opioids for pain relief.    No issues of abuse or addiction were noted by provider.      INVESTIGATIONS:  MRI scan of the thorcic spine done on  11/16 was reviewed. Some changes    Physical Exam   Constitutional: She is oriented to person, place, and time. She appears well-developed and well-nourished.   Pulmonary/Chest: Effort normal.   Neurological: She is alert and oriented to person, place, and time.   Skin: Skin is warm and dry.   Psychiatric: She has a normal mood and affect. Her behavior is normal. Judgment and thought content normal.     PHYSICAL EXAMINATION:(THORACIC)  The patient has  scars.  Has no scoliosis.  Has no kyphosis.  Has  tenderness in the spinous process from T1 to T 9.   Has  tenderness in the paraspinal muscles on both side.        1. Disc displacement, thoracic    2. Degeneration of thoracic intervertebral disc        PLAN:    1. PT does not NEED PROCEDURE.  2. CONTINUE OPIOIDS.  PT IS A [facility] CANDIDATE FOR OPIATE THERAPY.    3.Follow up 2 months.  4. Denies feeling has addiction to medication               CARE SHEET    OARRS 7/19 prescription history reviewed, and was as expected.  UDS      PROCEDURES:   PTNI  OPIOIDS: Patient is a good candidate for opioids.  I will titrate opioids up to a certain extent if needed.   MEDICATIONS: ON EFFEXOR AND EFFIENT AND ASA AND XANAX AND TRAZ. FAILED NSAIDS AND TYL [FAILED BOTH 10/18] AND UNLFS.  PHYSICAL THERAPY: PTNI  CHIROPRACTOR: PTNI  SURGERY:  AS A LAST RESORT   TENS:   PAIN PSYCHOLOGY: PT NOT INTERESTED   INVESTIGATIONS: MRI-T 11/16 some chgs,    UDS:  No ref. provider found  Jahree Dermody Dione Housekeeper SLOUGH, MD    [place]

## 2018-08-09 ENCOUNTER — Encounter

## 2018-08-09 MED ORDER — OXYCODONE-ACETAMINOPHEN 5-325 MG PO TABS
5-325 MG | ORAL_TABLET | Freq: Three times a day (TID) | ORAL | 0 refills | Status: DC
Start: 2018-08-09 — End: 2018-10-06

## 2018-08-09 NOTE — Telephone Encounter (Signed)
The pt was seen on 07/06/18 and only given one Rx due to having a past due balance. She now has paid the balance. Per Jacinto Reap- last filled Percocet 5/325mg  #90 on 07/18/18. I have created the Rx to fill when due. Thanks//JJ 08/09/18

## 2018-08-10 MED ORDER — RANOLAZINE ER 500 MG PO TB12
500 MG | ORAL_TABLET | ORAL | 3 refills | Status: DC
Start: 2018-08-10 — End: 2019-02-24

## 2018-08-10 NOTE — Telephone Encounter (Signed)
Last visit is on 05/12/2017  Next visit is on - none on file -- was told to follow up in 3 months ans never scheduled     Please advise 05/2017 CBC,CMP,MAG and 01/08/2019CMP

## 2018-08-11 ENCOUNTER — Encounter

## 2018-08-11 MED ORDER — OMEPRAZOLE 40 MG PO CPDR
40 MG | ORAL_CAPSULE | ORAL | 1 refills | Status: DC
Start: 2018-08-11 — End: 2019-02-24

## 2018-08-17 NOTE — Telephone Encounter (Signed)
Reason for Disposition  . Lightheadedness (dizziness) present now, after 2 hours of rest and fluids    Protocols used: DIZZINESS-ADULT-OH    Received call from [facility] in [employer].    Patient had episode of dizziness while walking to bathroom yesterday at work. She also had pain in her middle right back under her bra line.    She has been dizzy since waking today, is home from work resting and hydrating and is still dizzy.  Is able to walk.   She does have hx vertigo.    Call soft transferred to [facility] [place] to schedule appointment.

## 2018-08-26 ENCOUNTER — Encounter: Payer: PRIVATE HEALTH INSURANCE | Attending: Nurse Practitioner | Primary: Sports Medicine

## 2018-09-01 ENCOUNTER — Ambulatory Visit
Admit: 2018-09-01 | Discharge: 2018-09-01 | Payer: PRIVATE HEALTH INSURANCE | Attending: Family Medicine | Primary: Sports Medicine

## 2018-09-01 DIAGNOSIS — Z0000 Encounter for general adult medical examination without abnormal findings: Secondary | ICD-10-CM

## 2018-09-01 NOTE — Patient Instructions (Addendum)
Increase how often you are taking torsemide for your swelling -- start off taking every other day and monitor your swelling and your weight    We can increase or decrease the frequency based on your swelling and weight changes    Call your sleep medicine doctor to get your machine / mask checked and possibly adjusted -- this is likely a big cause of your fatigue

## 2018-09-01 NOTE — Progress Notes (Signed)
[place] Patient Visit      Patient: Rachael Mays is a 62 y.o. female who presents today to establish care and with the following Chief Complaint(s):  Chief Complaint   Patient presents with   ??? Established New Doctor     [place] [address] down steps 2 monts ago, left back and leg numb, believes toe maybe sprained   ??? Congestive Heart Failure     Swelling, chest pressure, weakness, takes Nitro when chest pressure comes but sometimes takes 3 tablets   ??? Fatigue     Leg pain when standing too long   ??? Dizziness     staggers   ??? Headache     For about a month   ??? Other     Brusing on body         HPI    Est care -- new patient    Reports falling 2 months ago and injured back / leg  Now has chronic back pain   Pain and numbness of left lateral thigh with neuropathic pain  Takes oxycondone and gabapentin at night  Sees pain mgmt    Has history of heart failure (diastolic)  Recent EF 76%  On metoprolol, lisinopril, aldactone  Reports swelling in both legs  Takes toresemide prn (only takes once about every 2 weeks currently)  Sees cardiology -- [facility]     Chronic fatigue  Does not feel rested in the morning even if she gets 8 hours sleep  Has OSA on BiPap but not sure if its helping      Current Outpatient Medications   Medication Sig Dispense Refill   ??? omeprazole (PRILOSEC) 40 MG delayed release capsule TAKE 1 CAPSULE BY MOUTH ONE TIME A DAY 90 capsule 1   ??? ranolazine (RANEXA) 500 MG extended release tablet TAKE ONE TABLET BY MOUTH TWICE A DAY 180 tablet 3   ??? oxyCODONE-acetaminophen (PERCOCET) 5-325 MG per tablet Take 1 tablet by mouth 3 times daily for 22 days. 66 tablet 0   ??? triamcinolone (KENALOG) 0.1 % cream Apply to affected area twice daily for up to 2 weeks or until improved. 45 g 2   ??? prasugrel (EFFIENT) 10 MG TABS TAKE ONE TABLET BY MOUTH ONE TIME A DAY 90 tablet 1   ??? lisinopril (PRINIVIL;ZESTRIL) 40 MG tablet TAKE 1 TABLET BY MOUTH ONE TIME A DAY 90 tablet 1   ??? spironolactone  (ALDACTONE) 25 MG tablet TAKE ONE AND ONE-HALF TABLETS BY MOUTH EVERY DAY 45 tablet 0   ??? montelukast (SINGULAIR) 10 MG tablet TAKE 1 TABLET BY MOUTH ONE TIME A DAY 90 tablet 3   ??? nitroGLYCERIN (NITROSTAT) 0.4 MG SL tablet up to max of 3 total doses. If no relief after 1 dose, call 911. 25 tablet 3   ??? zoster recombinant adjuvanted vaccine (SHINGRIX) 50 MCG SUSR injection .4mL IM X 1 followed by .54mL IM 2-6 months after dose #1 0.5 mL 0   ??? metoprolol (LOPRESSOR) 100 MG tablet TAKE 1 AND 1/2 TABLET BY MOUTH TWO TIMES A DAY 270 tablet 1   ??? aspirin 81 MG EC tablet Take 1 tablet by mouth daily 30 tablet 11   ??? triamcinolone (KENALOG) 0.1 % cream Apply topically 2 times daily.Apply with Q tip in each ear BID for 1 week. 15 g 1   ??? mupirocin (BACTROBAN) 2 % ointment Apply topically  Daily.Apply with Q tip in each naris at night 15 g 0   ???  albuterol sulfate HFA (PROVENTIL HFA) 108 (90 BASE) MCG/ACT inhaler Inhale 2 puffs into the lungs every 6 hours as needed for Wheezing 3 Inhaler 3   ??? Multiple Vitamins-Minerals (MULTIVITAMIN PO) Take 1 tablet by mouth daily      ??? CALCIUM-VITAMIN D PO Take 1 tablet by mouth daily      ??? cetirizine (ZYRTEC ALLERGY) 10 MG tablet Take 10 mg by mouth daily.     ??? fluocinonide (LIDEX) 0.05 % external solution APPLY SPARINGLY TO THE BACK OF THE SCALP DAILY IF NEEDED FOR ITCHING 60 mL 1     No current facility-administered medications for this visit.        Past Medical History:   Diagnosis Date   ??? Anxiety    ??? Asthma    ??? Blood circulation, collateral    ??? CAD (coronary artery disease)    ??? CHF (congestive heart failure) ([place])    ??? Chronic back pain    ??? Deep vein thrombosis    ??? 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 Depression    ??? GERD (gastroesophageal reflux disease)     NO LONGER SINCE LAP   ??? GERD (gastroesophageal reflux disease) 01/23/2009   ??? Hx of blood clots    ??? Hyperlipidemia     hx; resolved with lap band   ??? Hypertension    ??? Idiopathic peripheral  neuropathy 03/26/2017   ??? Melanoma ([place])     upper back   ??? Obesity     hx of; had lap band   ??? Obstructive sleep apnea (adult) (pediatric) 09/18/2015   ??? Pulmonary embolism [facility])      Past Surgical History:   Procedure Laterality Date   ??? BACK SURGERY      neck  plates   ??? BREAST SURGERY      left lumpectomy   ??? CESAREAN SECTION     ??? CHOLECYSTECTOMY     ??? COLONOSCOPY     ??? COLONOSCOPY  04/08/10   ??? CORONARY ANGIOPLASTY     ??? DIAGNOSTIC CARDIAC CATH LAB PROCEDURE     ??? DILATATION, ESOPHAGUS     ??? ENDOSCOPY, COLON, DIAGNOSTIC     ??? HYSTERECTOMY, VAGINAL     ??? LAP BAND  05/01/08    Dr. Nicole Cella   ??? OTHER SURGICAL HISTORY      [place] Filter: curently in place   ??? OTHER SURGICAL HISTORY  07/05/13    lap band removal   ??? SHOULDER SURGERY     ??? SKIN CANCER EXCISION  12/29/2017   ??? UPPER GASTROINTESTINAL ENDOSCOPY  05/19/13   ??? UPPER GASTROINTESTINAL ENDOSCOPY  07/21/13    ESOPHAGEAL STENT PLACEMENT   ??? UPPER GASTROINTESTINAL ENDOSCOPY N/A 07/31/2014    Esophagogastroduodenoscopy with esophageal balloon dilation   ??? VASCULAR SURGERY  04/2015    Lucendia Herrlich, celiac artery angiogram, normal abd arteries     Family History   Problem Relation Age of Onset   ??? Arthritis Mother    ??? Cancer Mother    ??? Depression Mother    ??? High Blood Pressure Mother    ??? Heart Disease Mother 37        MI    ??? Ovarian Cancer Mother 2   ??? Cancer Father 72        colon   ??? Heart Disease Maternal Grandmother    ??? Early Death Paternal Grandmother    ??? Heart Disease Paternal Grandfather    ??? Diabetes  Other    ??? High Blood Pressure Other    ??? Obesity Other    ??? Other Sister         OSA   ??? Other Brother         OSA   ??? Other Brother         OSA   ??? Other Daughter         OSA     Social History     Tobacco Use   ??? Smoking status: Former Smoker     Packs/day: 0.50     Years: 40.00     Pack years: 20.00     Types: Cigarettes     Last attempt to quit: 08/07/2012     Years since quitting: 6.0   ??? Smokeless tobacco: Former Systems developer     Quit date: 06/16/2013   ??? Tobacco  comment: started to smoke at age 10 / only smoked 0.5 p.p.d    Substance Use Topics   ??? Alcohol use: No     Alcohol/week: 0.0 standard drinks     Social History     Social History Narrative    Works as Scientist, water quality at [employer] History     Substance and Sexual Activity   Sexual Activity Never          Patient's past medical history, surgical history, family history, medications,  andallergies  were all reviewed and updated as appropriate today.      Review of Systems   Constitutional: Positive for fatigue. Negative for chills and fever.   HENT: Negative for congestion, ear discharge, ear pain, hearing loss, rhinorrhea, sore throat and trouble swallowing.    Eyes: Negative for photophobia and visual disturbance.   Respiratory: Positive for shortness of breath. Negative for cough and chest tightness.    Cardiovascular: Positive for leg swelling. Negative for chest pain and palpitations.   Gastrointestinal: Negative for abdominal pain, blood in stool, constipation, diarrhea, nausea and vomiting.   Genitourinary: Negative for difficulty urinating, dysuria and frequency.   Musculoskeletal: Positive for back pain. Negative for arthralgias, myalgias, neck pain and neck stiffness.   Skin: Negative for color change, pallor, rash and wound.   Neurological: Positive for numbness. Negative for dizziness, syncope, weakness, light-headedness and headaches.   Hematological: Negative for adenopathy. Does not bruise/bleed easily.   Psychiatric/Behavioral: Negative for behavioral problems, confusion, decreased concentration, dysphoric mood and sleep disturbance. The patient is not nervous/anxious.        Vitals:    09/01/18 1311   BP: (!) 178/78   Site: Right Upper Arm   Position: Sitting   Pulse: 66   SpO2: 91%   Weight: (!) 304 lb (137.9 kg)       Physical Exam   Constitutional: She is oriented to person, place, and time. She appears well-developed and well-nourished. No distress.   HENT:   Head: Normocephalic and  atraumatic.   Right Ear: Hearing, tympanic membrane, external ear and ear canal normal.   Left Ear: Hearing, tympanic membrane, external ear and ear canal normal.   Mouth/Throat: Oropharynx is clear and moist. No oropharyngeal exudate.   Eyes: Pupils are equal, round, and reactive to light. Conjunctivae and EOM are normal.   Neck: Normal range of motion. Neck supple. No thyromegaly present.   Cardiovascular: Normal rate, regular rhythm, normal heart sounds and intact distal pulses. Exam reveals no gallop and no friction rub.   No murmur  heard.  1+ pitting edema to just below the knee bilaterally   Pulmonary/Chest: Effort normal and breath sounds normal. She has no wheezes.   Abdominal: Soft. Bowel sounds are normal. She exhibits no distension and no mass. There is no tenderness.   Musculoskeletal: Normal range of motion. She exhibits no edema.   Lymphadenopathy:     She has no cervical adenopathy.   Neurological: She is alert and oriented to person, place, and time.   Skin: Skin is warm and dry. Capillary refill takes less than 2 seconds. No rash noted.   Vitals reviewed.          Assessment:  Encounter Diagnoses   Name Primary?   ??? Encounter for medical examination to establish care Yes   ??? Obstructive sleep apnea    ??? Chronic diastolic congestive heart failure ([place])    ??? 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 Encounter for medical examination to establish care  - reviewed PMHX, family history, social history, medications, allergies  - reviewed notes from specialists  - due for labs (prevoiusly ordered) -- advised patient to get these today  - will need to discuss HIV / Hep C screening and other HM at next visit    2. Obstructive sleep apnea  - chronic fatigue -- on Bipap but symptoms not improved   - advised to see sleep medication for adjustment of settings or trial of new mask    3. Chronic diastolic congestive heart failure (HCC)  - LE edema --  needs to increase torsemide -- start every other day and monitor edema / weight  - continue metoprolol, lisinopril, aldactone    4. 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         Isabel Caprice Awanda Mink, [place] [facility]  09/01/2018

## 2018-09-06 ENCOUNTER — Ambulatory Visit
Admit: 2018-09-06 | Discharge: 2018-09-06 | Payer: PRIVATE HEALTH INSURANCE | Attending: Family | Primary: Sports Medicine

## 2018-09-06 DIAGNOSIS — G4733 Obstructive sleep apnea (adult) (pediatric): Secondary | ICD-10-CM

## 2018-09-06 NOTE — Progress Notes (Signed)
Magdalene River MD, FAASM, FCCP  Judithann Graves, MSN, RN, CNP     [facility]  [place] [address]  Dept: [phone]  Dept Fax: [phone]  Loc: [facility]  [place] [address]  [phone]    Subjective:     Patient ID: Rachael Mays is a 62 y.o. female.    Chief Complaint   Patient presents with   ??? Sleep Apnea       HPI:      Machine Present today:  Yes    Machine Modem/Download Info:  Compliance (hours/night): 7.75 hrs/night  Download AHI (/hour): 1 /HR     Average IPAP Pressure: 10.5 cmH2O  Average EPAP Pressure: 8.9 cmH2O         AUTO BIPAP - Settings ([place])  IPAP Max: 25 cmH2O  EPAP Min: 8 cmH2O  Pressure Support Min: 1  Pressure Support Max: 2             Comfort Settings  Humidity Level (0-8): 1  Heated Tubing (Yes/No): Yes  Flex/EPR (0-3): 3 PAP Mask  Mask Type: Nasal mask  Mask Model: Wisp  Mask Size: sm     Epworth - Total score: 9    Follow-up :     Last Visit : March 2018      Patient reports the listed chronic Co-morbidities: Obesity, CAD, HTN   are well controlled and stable at this time.     Subjective Health Changes: None     Over Night Oximetry: []  Yes  []  No  [x]  NA []  WNL   Using O2: []  Yes  []  No  [x]  NA   Patient is compliant with the machine  [x]  Yes  []  No   Feeling rested when using the machine   [x]  Yes  []  No     Pressure is comfortable with inspiration and expiration  [x]  Yes  []  No     Noticed changes in pressure   []  Yes  []  No  [x]  NA   Mask is fitting well  [x]  Yes  []  No   Noting Mask Air Leak  []  Yes  [x]  No   Having painful Aerophagia  []  Yes  [x]  No   Nocturia   0  per night.   Having  HA upon waking  []  Yes  [x]  No   Dry mouth upon waking   Dry Nose  Dry Eyes  []  Yes  [x]  No   Congestion upon waking   []  Yes  [x]  No    Nose Bleeds  []  Yes  [x]  No   Using Sleep Aides    [x]  NA   Understands  how to change humidification and/or tubing temperature for comfort while at home  [x]  Yes  []  No     Difficulties falling asleep  []  Yes  [x]  No   Difficulties staying asleep  [x]  Yes  []  No   Approximate time to bed  2-3:30am   Approximate wake time  10:30am   Taking Naps  no   If taking naps usual length    [x]  NA   If taking naps using the machine  []  Yes  []  No  [x]  NA []  With and With out    Drowsy when driving  []  Yes  [x]  No  Does patient carry a DOT/CDL  []  Yes  [x]  No     Does patient carry FAA/Pilots License   []  Yes  [x]  No      Any concerns noted with the machine at this time  []  Yes  [x]  No        Diagnosis Orders   1. Obstructive sleep apnea     2. Morbid obesity with BMI of 40.0-44.9, adult ([place])     3. Coronary artery disease due to lipid rich plaque     4. Essential hypertension         The chronic medical conditions listed are directly related to the primary diagnosis listed above.  The management of the primary diagnosis affects the secondary diagnosis and vice versa.      Review of Systems   Constitutional: Negative for appetite change, chills, fatigue and fever.   HENT: Negative for congestion, nosebleeds, rhinorrhea and sinus pressure.    Eyes: Negative for pain and redness.   Respiratory: Negative for apnea, cough and shortness of breath.    Cardiovascular: Negative for chest pain and palpitations.   Gastrointestinal: Negative for abdominal distention and abdominal pain.   Neurological: Negative for dizziness and headaches.   Psychiatric/Behavioral: Positive for sleep disturbance.       Social History     Socioeconomic History   ??? Marital status: Married     Spouse name: [name]   ??? Number of children: 2   ??? Years of education: 5   ??? Highest education level: Not on file   Occupational History   ??? Not on file   Social Needs   ??? Financial resource strain: Not on file   ??? Food insecurity:     Worry: Not on file     Inability: Not on file   ??? Transportation needs:     Medical: Not on file      Non-medical: Not on file   Tobacco Use   ??? Smoking status: Former Smoker     Packs/day: 0.50     Years: 40.00     Pack years: 20.00     Types: Cigarettes     Last attempt to quit: 08/07/2012     Years since quitting: 6.0   ??? Smokeless tobacco: Former Systems developer     Quit date: 06/16/2013   ??? Tobacco comment: started to smoke at age 46 / only smoked 0.5 p.p.d    Substance and Sexual Activity   ??? Alcohol use: No     Alcohol/week: 0.0 standard drinks   ??? Drug use: No   ??? Sexual activity: Never   Lifestyle   ??? Physical activity:     Days per week: Not on file     Minutes per session: Not on file   ??? Stress: Not on file   Relationships   ??? Social connections:     Talks on phone: Not on file     Gets together: Not on file     Attends religious service: Not on file     Active member of club or organization: Not on file     Attends meetings of clubs or organizations: Not on file     Relationship status: Not on file   ??? Intimate partner violence:     Fear of current or ex partner: Not on file     Emotionally abused: Not on file     Physically abused: Not on file     Forced sexual activity: Not  on file   Other Topics Concern   ??? Not on file   Social History Narrative    Works as Scientist, water quality at [employer]            Prior to Admission medications    Medication Sig Start Date End Date Taking? Authorizing Provider   omeprazole (PRILOSEC) 40 MG delayed release capsule TAKE 1 CAPSULE BY MOUTH ONE TIME A DAY 08/11/18  Yes Richard Myles Rosenthal., MD   ranolazine (RANEXA) 500 MG extended release tablet TAKE ONE TABLET BY MOUTH TWICE A DAY 08/10/18  Yes Luiz Iron, APRN - CNP   oxyCODONE-acetaminophen (PERCOCET) 5-325 MG per tablet Take 1 tablet by mouth 3 times daily for 22 days. 08/15/18 09/06/18 Yes Evelina Bucy, APRN - CNP   triamcinolone (KENALOG) 0.1 % cream Apply to affected area twice daily for up to 2 weeks or until improved. 07/04/18  Yes Francisca December, MD   prasugrel (EFFIENT) 10 MG TABS TAKE ONE TABLET BY MOUTH ONE TIME A DAY 06/02/18   Yes Luiz Iron, APRN - CNP   lisinopril (PRINIVIL;ZESTRIL) 40 MG tablet TAKE 1 TABLET BY MOUTH ONE TIME A DAY 05/26/18  Yes Harvel Quale, MD   spironolactone (ALDACTONE) 25 MG tablet TAKE ONE AND ONE-HALF TABLETS BY MOUTH EVERY DAY 03/31/18  Yes Luiz Iron, APRN - CNP   montelukast (SINGULAIR) 10 MG tablet TAKE 1 TABLET BY MOUTH ONE TIME A DAY 02/24/18  Yes Harvel Quale, MD   fluocinonide (LIDEX) 0.05 % external solution APPLY SPARINGLY TO THE BACK OF THE SCALP DAILY IF NEEDED FOR ITCHING 02/16/18  Yes Francisca December, MD   nitroGLYCERIN (NITROSTAT) 0.4 MG SL tablet up to max of 3 total doses. If no relief after 1 dose, call 911. 12/31/17  Yes Harvel Quale, MD   zoster recombinant adjuvanted vaccine [facility]) 50 MCG SUSR injection .64mL IM X 1 followed by .72mL IM 2-6 months after dose #1 08/26/17  Yes Harvel Quale, MD   metoprolol (LOPRESSOR) 100 MG tablet TAKE 1 AND 1/2 TABLET BY MOUTH TWO TIMES A DAY 04/12/17  Yes Harvel Quale, MD   aspirin 81 MG EC tablet Take 1 tablet by mouth daily 02/16/17  Yes Harvel Quale, MD   triamcinolone (KENALOG) 0.1 % cream Apply topically 2 times daily.Apply with Q tip in each ear BID for 1 week. 01/04/17  Yes Alvina Chou, MD   mupirocin (BACTROBAN) 2 % ointment Apply topically  Daily.Apply with Q tip in each naris at night 01/04/17  Yes Alvina Chou, MD   albuterol sulfate HFA (PROVENTIL HFA) 108 (90 BASE) MCG/ACT inhaler Inhale 2 puffs into the lungs every 6 hours as needed for Wheezing 12/27/15  Yes Harvel Quale, MD   Multiple Vitamins-Minerals (MULTIVITAMIN PO) Take 1 tablet by mouth daily    Yes Historical Provider, MD   CALCIUM-VITAMIN D PO Take 1 tablet by mouth daily    Yes Historical Provider, MD   cetirizine (ZYRTEC ALLERGY) 10 MG tablet Take 10 mg by mouth daily.   Yes Historical Provider, MD       Allergies as of 09/06/2018 - Review Complete 09/06/2018   Allergen Reaction Noted   ???  Adhesive tape  09/30/2017   ??? Seasonal  08/28/2016   ??? Talwin [pentazocine] Other (See Comments) 03/29/2013       Patient Active Problem List   Diagnosis   ??? Back pain   ???  Depression   ??? Pulmonary embolism ([place])   ??? Abdominal  pain, other specified site   ??? Morbid obesity with BMI of 40.0-44.9, adult ([place])   ??? Status post gastric banding   ??? Tinnitus, subjective   ??? Other complications of gastric band procedure   ??? Essential hypertension   ??? Hyperglycemia   ??? Atypical chest pain   ??? 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 Adrenal mass, left ([place])   ??? Adrenal nodule ([place])   ??? Obstructive sleep apnea   ??? Abnormal stress test   ??? Chronic diastolic congestive heart failure ([place])   ??? Coronary artery disease due to lipid rich plaque   ??? Pulmonary hypertension   ??? 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 Chest pain   ??? Anxiety and depression   ??? Bradycardia   ??? Dyslipidemia   ??? Chronic eczematous otitis externa of both ears   ??? Mucositis   ??? Sprain of radiocarpal joint of right wrist   ??? Idiopathic peripheral neuropathy   ??? Chest pain in adult   ??? Hypoxia   ??? Chronic pain syndrome   ??? Unstable angina (HCC)   ??? Degeneration of thoracic intervertebral disc   ??? 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 Superficial spreading melanoma (HCC)       Past Medical History:   Diagnosis Date   ??? Anxiety    ??? Asthma    ??? Blood circulation, collateral    ??? CAD (coronary artery disease)    ??? CHF (congestive heart failure) ([place])    ??? Chronic back pain    ??? Deep vein thrombosis    ??? 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 Depression    ??? GERD (gastroesophageal reflux disease)     NO LONGER SINCE LAP   ??? GERD (gastroesophageal reflux disease) 01/23/2009   ??? Hx of blood clots    ??? Hyperlipidemia     hx; resolved with lap band   ??? Hypertension    ??? Idiopathic peripheral neuropathy 03/26/2017   ??? Melanoma ([place])     upper back   ??? Obesity     hx of; had lap  band   ??? Obstructive sleep apnea (adult) (pediatric) 09/18/2015   ??? Pulmonary embolism [facility])        Past Surgical History:   Procedure Laterality Date   ??? BACK SURGERY      neck  plates   ??? BREAST SURGERY      left lumpectomy   ??? CESAREAN SECTION     ??? CHOLECYSTECTOMY     ??? COLONOSCOPY     ??? COLONOSCOPY  04/08/10   ??? CORONARY ANGIOPLASTY     ??? DIAGNOSTIC CARDIAC CATH LAB PROCEDURE     ??? DILATATION, ESOPHAGUS     ??? ENDOSCOPY, COLON, DIAGNOSTIC     ??? HYSTERECTOMY, VAGINAL     ??? LAP BAND  05/01/08    Dr. Nicole Cella   ??? OTHER SURGICAL HISTORY      [place] Filter: curently in place   ??? OTHER SURGICAL HISTORY  07/05/13    lap band removal   ??? SHOULDER SURGERY     ??? SKIN CANCER EXCISION  12/29/2017   ??? UPPER GASTROINTESTINAL ENDOSCOPY  05/19/13   ??? UPPER GASTROINTESTINAL ENDOSCOPY  07/21/13    ESOPHAGEAL STENT PLACEMENT   ??? UPPER GASTROINTESTINAL ENDOSCOPY N/A 07/31/2014    Esophagogastroduodenoscopy with esophageal  balloon dilation   ??? VASCULAR SURGERY  04/2015    Lucendia Herrlich, celiac artery angiogram, normal abd arteries       Family History   Problem Relation Age of Onset   ??? Arthritis Mother    ??? Cancer Mother    ??? Depression Mother    ??? High Blood Pressure Mother    ??? Heart Disease Mother 67        MI    ??? Ovarian Cancer Mother 15   ??? Cancer Father 43        colon   ??? Heart Disease Maternal Grandmother    ??? Early Death Paternal Grandmother    ??? Heart Disease Paternal Grandfather    ??? Diabetes Other    ??? High Blood Pressure Other    ??? Obesity Other    ??? Other Sister         OSA   ??? Other Brother         OSA   ??? Other Brother         OSA   ??? Other Daughter         OSA       Vitals:  Weight BMI   Wt Readings from Last 3 Encounters:   09/06/18 (!) 307 lb (139.3 kg)   09/01/18 (!) 304 lb (137.9 kg)   06/16/18 (!) 311 lb (141.1 kg)    Body mass index is 49.55 kg/m??.     BP HR SaO2   BP Readings from Last 3 Encounters:   09/06/18 (!) 158/80   09/01/18 (!) 178/78   06/16/18 128/64    Pulse Readings from Last 3 Encounters:   09/06/18 60    09/01/18 66   06/16/18 58    SpO2 Readings from Last 3 Encounters:   09/06/18 98%   09/01/18 91%   06/06/18 98%        Assessment/Plan:     Morbid obesity with BMI of 40.0-44.9, adult (HCC)  Chronic-Stable.  Encouraged her to work on weight loss through diet and exercise.      Coronary artery disease due to lipid rich plaque  Chronic- Stable.  Cont meds per PCP and other physicians.      Essential hypertension  Chronic- Stable.  Cont meds per PCP and other physicians.      Obstructive sleep apnea  Reviewed compliance download with pt.  Supplies and parts as needed for her machine.  These are medically necessary.  Continue medications per her PCP and other physicians. Limit caffeine use after 3pm.  Encouraged her to work on weight loss through diet and exercise.  Diagnoses of Morbid obesity with BMI of 40.0-44.9, adult ([place]), Coronary artery disease due to lipid rich plaque, and Essential hypertension were pertinent to this visit.  The chronic medical conditions listed are directly related to the primary diagnosis listed above.  The management of the primary diagnosis affects the secondary diagnosis and vice versa.        The primary encounter diagnosis was Obstructive sleep apnea. Diagnoses of Morbid obesity with BMI of 40.0-44.9, adult ([place]), Coronary artery disease due to lipid rich plaque, and Essential hypertension were also pertinent to this visit.  The chronic medical conditions listed are directly related to the primary diagnosis listed above.  The management of the primary diagnosis affects the secondary diagnosis and vice versa.     - Educated patient and reviewed compliance download with pt.    -Supplies and parts as needed for her  machine, these are medically necessary.    - Patient using [employer] for supplies  -Continue medications per her PCP and other physicians.   -Limit caffeine use after 3pm.    -Encouraged her to work on weight loss through diet and exercise.  - Will try one step lower for  comfort   - EPAP 7  -F/U: 12 month.    No orders of the defined types were placed in this encounter.      No orders of the defined types were placed in this encounter.      No orders of the defined types were placed in this encounter.      Judithann Graves, MSN, RN, CNP

## 2018-09-06 NOTE — Assessment & Plan Note (Signed)
Chronic-Stable.  Encouraged her to work on weight loss through diet and exercise.

## 2018-09-06 NOTE — Assessment & Plan Note (Signed)
Chronic- Stable.  Cont meds per PCP and other physicians.

## 2018-09-06 NOTE — Progress Notes (Signed)
Rachael Mays         DOB: 1956/06/11    Diagnosis: [x]  OSA (G47.33) []  CSA (G47.31) []  Apnea (G47.30)   Length of Need: [x]  12 Months []  99 Months []  Other:    Machine (DAW!): [x]  Respironics Dream Station      Auto []  ResMed AirSense     Auto []  Other:     []   CPAP (V7846) []  Bilevel (N6295)   Mode: []  Auto []  Spontaneous    Mode: []  Auto []  Spontaneous                            Comfort Settings:   - Ramp Pressure:  cmH2O                                        - Ramp time: 15 min                                     -  Flex/EPR - 3 full time                                    - For ResMed Bilevel (TiMax-4 sec   TiMin- 0.2 sec)     Humidifier: [x]  Heated (M8413)        [x]  Water chamber replacement (A7046)/ 1 per 6 months        Mask:   [x]  Nasal (K4401) /1 per 3 months []  Full Face (A7030) /1 per 3 months   [x]  Patient choice -Size and fit mask []  Patient Choice - Size and fit mask   []  Dispense:  []  Dispense:    [x]  Headgear (U2725) / 1 per 3 months []  Headgear (D6644) / 1 per 3 months   [x]  Replacement Nasal Cushion (A7032)/2 per month []  Interface Replacement (A7031)/1 per month   []  Replacement Nasal Pillows (A7033)/2 per month         Tubing: [x]  Heated (A4604)/1 per 3 months    []  Standard (A7037)/1 per 3 months []  Other:           Filters: [x]  Non-disposable (A7039)/1 per 6 months     [x]  Ultra-Fine, Disposable (A7038)/2 per month        Miscellaneous: []  Chin Strap (A7036)/ 1 per 6 months []  O2 bleed-in:       LPM   []  Oximetry on CPAP/Bilevel []   Other:          Start Order Date: 09/06/18    MEDICAL JUSTIFICATION:  I, the undersigned, certify that the above prescribed supplies are medically necessary for this patient???s wellbeing.  In my opinion, the supplies are both reasonable and necessary in reference to accepted standards of medicalpractice in treatment of this patient???s condition.    Orson Ape, APRN - CNP      NPI: [id number]       Order Signed Date: 09/06/18    Electronically signed by  Orson Ape, APRN - CNP on 09/06/2018 at 3:27 PM

## 2018-09-06 NOTE — Assessment & Plan Note (Signed)
Reviewed compliance download with pt.  Supplies and parts as needed for her machine.  These are medically necessary.  Continue medications per her PCP and other physicians. Limit caffeine use after 3pm.  Encouraged her to work on weight loss through diet and exercise.  Diagnoses of Morbid obesity with BMI of 40.0-44.9, adult ([place]), Coronary artery disease due to lipid rich plaque, and Essential hypertension were pertinent to this visit.  The chronic medical conditions listed are directly related to the primary diagnosis listed above.  The management of the primary diagnosis affects the secondary diagnosis and vice versa.

## 2018-09-07 ENCOUNTER — Ambulatory Visit
Admit: 2018-09-07 | Discharge: 2018-09-07 | Payer: PRIVATE HEALTH INSURANCE | Attending: Nurse Practitioner | Primary: Sports Medicine

## 2018-09-07 DIAGNOSIS — M5124 Other intervertebral disc displacement, thoracic region: Secondary | ICD-10-CM

## 2018-09-07 MED ORDER — OXYCODONE-ACETAMINOPHEN 5-325 MG PO TABS
5-325 MG | ORAL_TABLET | Freq: Three times a day (TID) | ORAL | 0 refills | Status: AC
Start: 2018-09-07 — End: 2018-10-07

## 2018-09-07 NOTE — Progress Notes (Signed)
[facility]    Rachael Mays  Apr 06, 1956  09/07/18    Chief Complaint   Patient presents with   ??? Back Pain       Social History     Socioeconomic History   ??? Marital status: Married     Spouse name: [name]   ??? Number of children: 2   ??? Years of education: 78   ??? Highest education level: None   Occupational History   ??? None   Social Needs   ??? Financial resource strain: None   ??? Food insecurity:     Worry: None     Inability: None   ??? Transportation needs:     Medical: None     Non-medical: None   Tobacco Use   ??? Smoking status: Former Smoker     Packs/day: 0.50     Years: 40.00     Pack years: 20.00     Types: Cigarettes     Last attempt to quit: 08/07/2012     Years since quitting: 6.0   ??? Smokeless tobacco: Former Systems developer     Quit date: 06/16/2013   ??? Tobacco comment: started to smoke at age 77 / only smoked 0.5 p.p.d    Substance and Sexual Activity   ??? Alcohol use: No     Alcohol/week: 0.0 standard drinks   ??? Drug use: No   ??? Sexual activity: Never   Lifestyle   ??? Physical activity:     Days per week: None     Minutes per session: None   ??? Stress: None   Relationships   ??? Social connections:     Talks on phone: None     Gets together: None     Attends religious service: None     Active member of club or organization: None     Attends meetings of clubs or organizations: None     Relationship status: None   ??? Intimate partner violence:     Fear of current or ex partner: None     Emotionally abused: None     Physically abused: None     Forced sexual activity: None   Other Topics Concern   ??? None   Social History Narrative    Works as Scientist, water quality at [employer]          Past Medical History:   Diagnosis Date   ??? Anxiety    ??? Asthma    ??? Blood circulation, collateral    ??? CAD (coronary artery disease)    ??? CHF (congestive heart failure) ([place])    ??? Chronic back pain    ??? Deep vein thrombosis    ??? 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 Depression    ??? GERD (gastroesophageal reflux  disease)     NO LONGER SINCE LAP   ??? GERD (gastroesophageal reflux disease) 01/23/2009   ??? Hx of blood clots    ??? Hyperlipidemia     hx; resolved with lap band   ??? Hypertension    ??? Idiopathic peripheral neuropathy 03/26/2017   ??? Melanoma ([place])     upper back   ??? Obesity     hx of; had lap band   ??? Obstructive sleep apnea (adult) (pediatric) 09/18/2015   ??? Pulmonary embolism (HCC)      HISTORY OF PRESENT ILLNESS:   The patient reports pain to the thoracic spine.  Pain is reported from T 1 to T9 on across side.  Right = left.     Rates pain as 6/10 on NRS, not constant.  Describes as:  Aching  yes  Burning yes  Stabbing yes  Cramping    Sharp    Dull    Deep    Throbbing  yes  Shooting   Electric     Pins and Needles      Reports fell down steps 2 months ago now has pain left thigh.    Recent excision melanoma upper thoracic back, per Dr Cammy Brochure, sutures just removed- doing well.    Review of Systems   Constitutional: Negative for fever and unexpected weight change.   Psychiatric/Behavioral: Negative for agitation.       REVIEW OF SYSTEMS:  Genitourinary:  There are not accidents of the bladder.  Gastrointestinal:  There are not accidents of the bowel.    NEUROLOGICAL:    The patient does  report numbness to both lower extremity.  The patient does  report tingling to  both lower extremity.  The patient does  report weakness to  both lower extremity.    OPIOID MEDICATION HISTORY:  The patient is currently prescribed opioid medication.  The patient reports that the opioid medication improves their ability to perform  :  Personal care:    House work:    Physical activity:   Shop:    Social activity:    Work :     Pt reports no side effects from their opioid medication, except constipation controlled.    The patient reports that the medication is helping their pain. The medication is helping them to be more functional.  They need opioids for pain relief.    No issues of abuse or addiction were noted by provider.       INVESTIGATIONS:  MRI scan of the thorcic spine done on 11/16 was reviewed. Some changes    Physical Exam   Constitutional: She is oriented to person, place, and time. She appears well-developed and well-nourished.   Pulmonary/Chest: Effort normal.   Neurological: She is alert and oriented to person, place, and time.   Skin: Skin is warm and dry.   Psychiatric: She has a normal mood and affect. Her behavior is normal. Judgment and thought content normal.     PHYSICAL EXAMINATION:(THORACIC)  The patient has  scars.  Has no scoliosis.  Has no kyphosis.  Has  tenderness in the spinous process from T1 to T 9.   Has  tenderness in the paraspinal muscles on both side R> L.        1. Disc displacement, thoracic    2. Degeneration of thoracic intervertebral disc        PLAN:    1. PT does not NEED PROCEDURE.  2. CONTINUE OPIOIDS.  PT IS A [facility] CANDIDATE FOR OPIATE THERAPY.    3.Follow up 1 months.  4. Denies feeling has addiction to medication  5. UDS today             CARE SHEET    [facility] 10/19 prescription history reviewed, and was as expected.  UDS      PROCEDURES:   PTNI  OPIOIDS: Patient is a good candidate for opioids.  I will titrate opioids up to a certain extent if needed.   MEDICATIONS: ON EFFEXOR AND EFFIENT AND ASA AND XANAX AND TRAZ. FAILED NSAIDS AND TYL [FAILED BOTH 10/18] AND UNLFS.  PHYSICAL THERAPY: PTNI  CHIROPRACTOR: PTNI  SURGERY:  AS A LAST RESORT   TENS:  PAIN PSYCHOLOGY: PT NOT INTERESTED   INVESTIGATIONS: MRI-T 11/16 some chgs,    UDS:      No ref. provider found  No primary care provider on file.    Helene Kelp Zackry Deines,CNP

## 2018-09-08 NOTE — Telephone Encounter (Signed)
Have her make an appointment soon

## 2018-09-08 NOTE — Telephone Encounter (Signed)
Please advise

## 2018-09-08 NOTE — Telephone Encounter (Signed)
Patient scheduled 10/11 @ 1pm w/ Dr. Awanda Mink

## 2018-09-08 NOTE — Telephone Encounter (Signed)
Pt is having pain with backside with aches. Also, with her right foot big toe. Pt was told to call pain management doctor. Pt said that the doctor said that they only deal with chronic pain and to give Dr. Awanda Mink a call. Pls contact pt at [phone]

## 2018-09-16 ENCOUNTER — Inpatient Hospital Stay: Admit: 2018-09-16 | Payer: PRIVATE HEALTH INSURANCE | Primary: Sports Medicine

## 2018-09-16 ENCOUNTER — Inpatient Hospital Stay: Payer: PRIVATE HEALTH INSURANCE | Primary: Sports Medicine

## 2018-09-16 ENCOUNTER — Ambulatory Visit
Admit: 2018-09-16 | Discharge: 2018-09-16 | Payer: PRIVATE HEALTH INSURANCE | Attending: Family Medicine | Primary: Sports Medicine

## 2018-09-16 DIAGNOSIS — M79674 Pain in right toe(s): Secondary | ICD-10-CM

## 2018-09-16 DIAGNOSIS — I5032 Chronic diastolic (congestive) heart failure: Secondary | ICD-10-CM

## 2018-09-16 MED ORDER — DICLOFENAC SODIUM 1 % TD GEL
1 | Freq: Two times a day (BID) | TRANSDERMAL | 3 refills | Status: DC
Start: 2018-09-16 — End: 2019-11-01

## 2018-09-16 NOTE — Patient Instructions (Signed)
Continue the torsemide every other day    i'll call to discuss your Xray results    Try using the voltaren gel on your thumb / hand

## 2018-09-16 NOTE — Progress Notes (Signed)
[place]  Office Visit      Patient: Rachael Mays is a 62 y.o. female who presents today to establish care and with the following Chief Complaint(s):  Chief Complaint   Patient presents with   ??? Follow-up     Bilateral Leg pain   ??? Ingrown Toenail     Left big toe, possible fungus on toe         HPI    Here for follow-up of CHF  Had swelling in both legs at last visit, was only taking torsemide once weekly  Increased to every other day   Significant improvement in swelling, tenderness of legs  Able to walk better   Not feeling SOB, dyspnea, or chest tightness  No lightheadedness, dizziness or syncope  Has been peeing more often but otherwise no side effects  Feels much better, like she has more energy    Has pain at base of left thumb  No injury or trauma  Has pain grabbing and holding on to things  No swelling or bruising   No numbness or tingling in her feet    Pain or right big toe  Injury about 2 months ago -- swelling, black and blue  This resolved but pain has continued  Pain with walking and bearing weight      Current Outpatient Medications   Medication Sig Dispense Refill   ??? diclofenac sodium 1 % GEL Apply 2 g topically 2 times daily 1 Tube 3   ??? oxyCODONE-acetaminophen (PERCOCET) 5-325 MG per tablet Take 1 tablet by mouth 3 times daily for 30 days. 90 tablet 0   ??? omeprazole (PRILOSEC) 40 MG delayed release capsule TAKE 1 CAPSULE BY MOUTH ONE TIME A DAY 90 capsule 1   ??? ranolazine (RANEXA) 500 MG extended release tablet TAKE ONE TABLET BY MOUTH TWICE A DAY 180 tablet 3   ??? triamcinolone (KENALOG) 0.1 % cream Apply to affected area twice daily for up to 2 weeks or until improved. 45 g 2   ??? prasugrel (EFFIENT) 10 MG TABS TAKE ONE TABLET BY MOUTH ONE TIME A DAY 90 tablet 1   ??? lisinopril (PRINIVIL;ZESTRIL) 40 MG tablet TAKE 1 TABLET BY MOUTH ONE TIME A DAY 90 tablet 1   ??? spironolactone (ALDACTONE) 25 MG tablet TAKE ONE AND ONE-HALF TABLETS BY MOUTH EVERY DAY 45 tablet 0   ??? montelukast  (SINGULAIR) 10 MG tablet TAKE 1 TABLET BY MOUTH ONE TIME A DAY 90 tablet 3   ??? fluocinonide (LIDEX) 0.05 % external solution APPLY SPARINGLY TO THE BACK OF THE SCALP DAILY IF NEEDED FOR ITCHING 60 mL 1   ??? nitroGLYCERIN (NITROSTAT) 0.4 MG SL tablet up to max of 3 total doses. If no relief after 1 dose, call 911. 25 tablet 3   ??? zoster recombinant adjuvanted vaccine (SHINGRIX) 50 MCG SUSR injection .72mL IM X 1 followed by .53mL IM 2-6 months after dose #1 0.5 mL 0   ??? metoprolol (LOPRESSOR) 100 MG tablet TAKE 1 AND 1/2 TABLET BY MOUTH TWO TIMES A DAY 270 tablet 1   ??? aspirin 81 MG EC tablet Take 1 tablet by mouth daily 30 tablet 11   ??? triamcinolone (KENALOG) 0.1 % cream Apply topically 2 times daily.Apply with Q tip in each ear BID for 1 week. 15 g 1   ??? mupirocin (BACTROBAN) 2 % ointment Apply topically  Daily.Apply with Q tip in each naris at night 15 g 0   ???  albuterol sulfate HFA (PROVENTIL HFA) 108 (90 BASE) MCG/ACT inhaler Inhale 2 puffs into the lungs every 6 hours as needed for Wheezing 3 Inhaler 3   ??? Multiple Vitamins-Minerals (MULTIVITAMIN PO) Take 1 tablet by mouth daily      ??? CALCIUM-VITAMIN D PO Take 1 tablet by mouth daily      ??? cetirizine (ZYRTEC ALLERGY) 10 MG tablet Take 10 mg by mouth daily.       No current facility-administered medications for this visit.        Past Medical History:   Diagnosis Date   ??? Anxiety    ??? Asthma    ??? Blood circulation, collateral    ??? CAD (coronary artery disease)    ??? CHF (congestive heart failure) ([place])    ??? Chronic back pain    ??? Deep vein thrombosis    ??? 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 Depression    ??? GERD (gastroesophageal reflux disease)     NO LONGER SINCE LAP   ??? GERD (gastroesophageal reflux disease) 01/23/2009   ??? Hx of blood clots    ??? Hyperlipidemia     hx; resolved with lap band   ??? Hypertension    ??? Idiopathic peripheral neuropathy 03/26/2017   ??? Melanoma ([place])     upper back   ??? Obesity     hx of; had lap band   ??? Obstructive  sleep apnea (adult) (pediatric) 09/18/2015   ??? Pulmonary embolism [facility])      Past Surgical History:   Procedure Laterality Date   ??? BACK SURGERY      neck  plates   ??? BREAST SURGERY      left lumpectomy   ??? CESAREAN SECTION     ??? CHOLECYSTECTOMY     ??? COLONOSCOPY     ??? COLONOSCOPY  04/08/10   ??? CORONARY ANGIOPLASTY     ??? DIAGNOSTIC CARDIAC CATH LAB PROCEDURE     ??? DILATATION, ESOPHAGUS     ??? ENDOSCOPY, COLON, DIAGNOSTIC     ??? HYSTERECTOMY, VAGINAL     ??? LAP BAND  05/01/08    Dr. Nicole Cella   ??? OTHER SURGICAL HISTORY      [place] Filter: curently in place   ??? OTHER SURGICAL HISTORY  07/05/13    lap band removal   ??? SHOULDER SURGERY     ??? SKIN CANCER EXCISION  12/29/2017   ??? UPPER GASTROINTESTINAL ENDOSCOPY  05/19/13   ??? UPPER GASTROINTESTINAL ENDOSCOPY  07/21/13    ESOPHAGEAL STENT PLACEMENT   ??? UPPER GASTROINTESTINAL ENDOSCOPY N/A 07/31/2014    Esophagogastroduodenoscopy with esophageal balloon dilation   ??? VASCULAR SURGERY  04/2015    Lucendia Herrlich, celiac artery angiogram, normal abd arteries     Family History   Problem Relation Age of Onset   ??? Arthritis Mother    ??? Cancer Mother    ??? Depression Mother    ??? High Blood Pressure Mother    ??? Heart Disease Mother 93        MI    ??? Ovarian Cancer Mother 55   ??? Cancer Father 71        colon   ??? Heart Disease Maternal Grandmother    ??? Early Death Paternal Grandmother    ??? Heart Disease Paternal Grandfather    ??? Diabetes Other    ??? High Blood Pressure Other    ??? Obesity Other    ??? Other Sister  OSA   ??? Other Brother         OSA   ??? Other Brother         OSA   ??? Other Daughter         OSA     Social History     Tobacco Use   ??? Smoking status: Former Smoker     Packs/day: 0.50     Years: 40.00     Pack years: 20.00     Types: Cigarettes     Last attempt to quit: 08/07/2012     Years since quitting: 6.1   ??? Smokeless tobacco: Former Systems developer     Quit date: 06/16/2013   ??? Tobacco comment: started to smoke at age 56 / only smoked 0.5 p.p.d    Substance Use Topics   ??? Alcohol use: No      Alcohol/week: 0.0 standard drinks     Social History     Social History Narrative    Works as Scientist, water quality at [employer] History     Substance and Sexual Activity   Sexual Activity Never          Patient's past medical history, surgical history, family history, medications,  andallergies  were all reviewed and updated as appropriate today.      Review of Systems   Constitutional: Negative for chills, fatigue and fever.   HENT: Negative for hearing loss, rhinorrhea, sore throat and trouble swallowing.    Eyes: Negative for photophobia and visual disturbance.   Respiratory: Negative for cough, chest tightness and shortness of breath.    Cardiovascular: Negative for chest pain, palpitations and leg swelling.   Gastrointestinal: Negative for abdominal pain, blood in stool, constipation, diarrhea, nausea and vomiting.   Genitourinary: Positive for frequency (due to torsemide). Negative for difficulty urinating, dysuria, pelvic pain and urgency.   Musculoskeletal: Positive for gait problem (pain of right big toe). Negative for arthralgias, back pain, joint swelling, myalgias, neck pain and neck stiffness.   Skin: Negative for rash and wound.   Neurological: Negative for dizziness, syncope, weakness, light-headedness, numbness and headaches.   Hematological: Negative for adenopathy. Does not bruise/bleed easily.   Psychiatric/Behavioral: Negative for dysphoric mood and sleep disturbance. The patient is not nervous/anxious.        Vitals:    09/16/18 1312   BP: 128/82   Site: Left Upper Arm   Position: Sitting   Pulse: 55   SpO2: 96%   Weight: 298 lb 6.4 oz (135.4 kg)       Physical Exam   Constitutional: She is oriented to person, place, and time. She appears well-developed and well-nourished. No distress.   HENT:   Head: Normocephalic and atraumatic.   Right Ear: Hearing, tympanic membrane, external ear and ear canal normal.   Left Ear: Hearing, tympanic membrane, external ear and ear canal normal.   Mouth/Throat:  Oropharynx is clear and moist. No oropharyngeal exudate.   Eyes: Pupils are equal, round, and reactive to light. Conjunctivae and EOM are normal.   Neck: Normal range of motion. Neck supple. No thyromegaly present.   Cardiovascular: Normal rate, regular rhythm, normal heart sounds and intact distal pulses. Exam reveals no gallop and no friction rub.   No murmur heard.  Pulmonary/Chest: Effort normal and breath sounds normal. She has no wheezes.   Abdominal: Soft. Bowel sounds are normal. She exhibits no distension and no mass. There is no tenderness.  Musculoskeletal: Normal range of motion. She exhibits no edema, tenderness or deformity.   Pain with abduction of L thumb -- normal ROM, no tenderness   Lymphadenopathy:     She has no cervical adenopathy.   Neurological: She is alert and oriented to person, place, and time.   Skin: Skin is warm and dry. Capillary refill takes less than 2 seconds. No rash noted.   Vitals reviewed.          Assessment:  Encounter Diagnoses   Name Primary?   ??? Chronic diastolic congestive heart failure (HCC) Yes   ??? Great toe pain, right    ??? Chronic pain of left thumb        Plan:  1. Chronic diastolic congestive heart failure (HCC)  - edema in legs greatly improved, weight down 10 pounds  - continue torsemide every other day for now   - check weight daily and monitor edema    2. Great toe pain, right  - prior injury 2 months ago -- still having pain  - XR FOOT RIGHT (MIN 3 VIEWS); Future    3. Chronic pain of left thumb  - likely muscular inflammation at thenar eminence -- topical Nsaid given h/o CHF, HTN  - diclofenac sodium 1 % GEL; Apply 2 g topically 2 times daily  Dispense: 1 Tube; Refill: 3      Return in about 3 months (around 12/17/2018) for follow-up or sooner as needed.        Isabel Caprice Awanda Mink, [facility]  09/16/2018

## 2018-09-20 MED ORDER — METOPROLOL TARTRATE 100 MG PO TABS
100 MG | ORAL_TABLET | ORAL | 1 refills | Status: DC
Start: 2018-09-20 — End: 2019-02-24

## 2018-10-06 ENCOUNTER — Ambulatory Visit
Admit: 2018-10-06 | Discharge: 2018-10-06 | Payer: PRIVATE HEALTH INSURANCE | Attending: Interventional Pain Medicine | Primary: Sports Medicine

## 2018-10-06 DIAGNOSIS — M5124 Other intervertebral disc displacement, thoracic region: Secondary | ICD-10-CM

## 2018-10-06 MED ORDER — OXYCODONE-ACETAMINOPHEN 5-325 MG PO TABS
5-325 MG | ORAL_TABLET | Freq: Three times a day (TID) | ORAL | 0 refills | Status: AC
Start: 2018-10-06 — End: 2018-11-05

## 2018-10-06 MED ORDER — OXYCODONE-ACETAMINOPHEN 5-325 MG PO TABS
5-325 MG | ORAL_TABLET | Freq: Three times a day (TID) | ORAL | 0 refills | Status: AC
Start: 2018-10-06 — End: 2018-12-17

## 2018-10-06 MED ORDER — OXYCODONE-ACETAMINOPHEN 5-325 MG PO TABS
5-325 MG | ORAL_TABLET | Freq: Three times a day (TID) | ORAL | 0 refills | Status: AC
Start: 2018-10-06 — End: 2018-12-03

## 2018-10-06 NOTE — Progress Notes (Signed)
[facility]    Rachael Mays  02-13-1956  10/06/18    Chief Complaint   Patient presents with   ??? Back Pain       Social History     Socioeconomic History   ??? Marital status: Married     Spouse name: [name]   ??? Number of children: 2   ??? Years of education: 3   ??? Highest education level: None   Occupational History   ??? None   Social Needs   ??? Financial resource strain: None   ??? Food insecurity:     Worry: None     Inability: None   ??? Transportation needs:     Medical: None     Non-medical: None   Tobacco Use   ??? Smoking status: Former Smoker     Packs/day: 0.50     Years: 40.00     Pack years: 20.00     Types: Cigarettes     Last attempt to quit: 08/07/2012     Years since quitting: 6.1   ??? Smokeless tobacco: Former Systems developer     Quit date: 06/16/2013   ??? Tobacco comment: started to smoke at age 24 / only smoked 0.5 p.p.d    Substance and Sexual Activity   ??? Alcohol use: No     Alcohol/week: 0.0 standard drinks   ??? Drug use: No   ??? Sexual activity: Never   Lifestyle   ??? Physical activity:     Days per week: None     Minutes per session: None   ??? Stress: None   Relationships   ??? Social connections:     Talks on phone: None     Gets together: None     Attends religious service: None     Active member of club or organization: None     Attends meetings of clubs or organizations: None     Relationship status: None   ??? Intimate partner violence:     Fear of current or ex partner: None     Emotionally abused: None     Physically abused: None     Forced sexual activity: None   Other Topics Concern   ??? None   Social History Narrative    Works as Scientist, water quality at [employer]          Past Medical History:   Diagnosis Date   ??? Anxiety    ??? Asthma    ??? Blood circulation, collateral    ??? CAD (coronary artery disease)    ??? CHF (congestive heart failure) ([place])    ??? Chronic back pain    ??? Deep vein thrombosis    ??? 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 Depression    ??? GERD (gastroesophageal reflux  disease)     NO LONGER SINCE LAP   ??? GERD (gastroesophageal reflux disease) 01/23/2009   ??? Hx of blood clots    ??? Hyperlipidemia     hx; resolved with lap band   ??? Hypertension    ??? Idiopathic peripheral neuropathy 03/26/2017   ??? Melanoma ([place])     upper back   ??? Obesity     hx of; had lap band   ??? Obstructive sleep apnea (adult) (pediatric) 09/18/2015   ??? Pulmonary embolism (HCC)      HISTORY OF PRESENT ILLNESS:   The patient reports pain to the thoracic spine.  Pain is reported from T 1 to T9 on across side.  Right = left.     Rates pain as 7/10 on NRS, not constant.  Describes as:  Aching  yes  Burning yes  Stabbing yes  Cramping    Sharp    Dull    Deep    Throbbing  yes  Shooting   Electric     Pins and Needles      Reports fell down steps 2 months ago now has pain left thigh.    Recent excision melanoma upper thoracic back, per Dr Cammy Brochure, sutures just removed- doing well.    Review of Systems   Constitutional: Negative for fever and unexpected weight change.   Psychiatric/Behavioral: Negative for agitation.       REVIEW OF SYSTEMS:  Genitourinary:  There are not accidents of the bladder.  Gastrointestinal:  There are not accidents of the bowel.    NEUROLOGICAL:    The patient does  report numbness to both lower extremity.  The patient does  report tingling to  both lower extremity.  The patient does  report weakness to  both lower extremity.    OPIOID MEDICATION HISTORY:  The patient is currently prescribed opioid medication.  The patient reports that the opioid medication improves their ability to perform  :  Personal care:    House work:    Physical activity:   Shop:    Social activity:    Work :     Pt reports no side effects from their opioid medication, except constipation controlled.    The patient reports that the medication is helping their pain. The medication is helping them to be more functional.  They need opioids for pain relief.    No issues of abuse or addiction were noted by provider.       INVESTIGATIONS:  MRI scan of the thorcic spine done on 11/16 was reviewed. Some changes    Physical Exam   Constitutional: She is oriented to person, place, and time. She appears well-developed and well-nourished.   Pulmonary/Chest: Effort normal.   Neurological: She is alert and oriented to person, place, and time.   Skin: Skin is warm and dry.   Psychiatric: She has a normal mood and affect. Her behavior is normal. Judgment and thought content normal.     PHYSICAL EXAMINATION:(THORACIC)  The patient has  scars.  Has no scoliosis.  Has no kyphosis.  Has  tenderness in the spinous process from T1 to T 9.   Has  tenderness in the paraspinal muscles on both side R> L.      PE L SPINE NORMAL X MILD N IN L LEG      1. 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 NO WEAKNESS. DOESN'T WANT SX OR INJECTIONS. XRAY NORMAL     PT NOT ON ANY OTHER CONTROLLED SUBSTANCE AND NAB AND DWDD AND DFGA AND PMP GOOD 10/19 . OPIOIDS HELPING W/O SIDEEFFECTS AND HELPING TO BE MORE FUNCTIONAL.      Patient doesnt want any injections at this time. Getting pain relief with opioids without sideeffects and making her more functional.        1. PT does not NEED PROCEDURE.  2. CONTINUE OPIOIDS.  PT IS A [facility] CANDIDATE FOR OPIATE THERAPY.    3.Follow up 2 months.  4. Denies feeling has addiction to medication  5.  CARE SHEET    OARRS 10/19 prescription history reviewed, and was as expected.  UDS PASSED 10/19      PROCEDURES:   PTNI  OPIOIDS: Patient is a good candidate for opioids.  I will titrate opioids up to a certain extent if needed.   MEDICATIONS: ON EFFEXOR AND EFFIENT AND ASA AND XANAX AND TRAZ. FAILED NSAIDS AND TYL [FAILED BOTH 10/18] AND UNLFS.  PHYSICAL THERAPY: PTNI  CHIROPRACTOR: PTNI  SURGERY:  AS A LAST RESORT   TENS:   PAIN PSYCHOLOGY: PT NOT INTERESTED   INVESTIGATIONS: MRI-T 11/16 some chgs,    UDS:      No ref. provider  found  Stephan Minister, MD    [place]

## 2018-10-26 ENCOUNTER — Ambulatory Visit: Payer: PRIVATE HEALTH INSURANCE | Primary: Sports Medicine

## 2018-11-28 ENCOUNTER — Encounter

## 2018-11-28 MED ORDER — LISINOPRIL 40 MG PO TABS
40 MG | ORAL_TABLET | ORAL | 1 refills | Status: DC
Start: 2018-11-28 — End: 2019-02-24

## 2018-12-14 ENCOUNTER — Encounter: Payer: PRIVATE HEALTH INSURANCE | Attending: Family Medicine | Primary: Family

## 2018-12-15 ENCOUNTER — Encounter: Attending: Nurse Practitioner | Primary: Family

## 2018-12-29 NOTE — Telephone Encounter (Signed)
RX APPROVAL:      Refill:   Requested Prescriptions      No prescriptions requested or ordered in this encounter      Last OV: has an appointment on 02/02/19  Last EKG:   Last Labs:   Lab Results   Component Value Date    GLUCOSE 192 12/14/2017    BUN 14 12/14/2017    CREATININE 1.1 12/14/2017    LABGLOM 51 12/14/2017    NA 146 12/14/2017    K 4.4 12/14/2017    K 3.9 05/05/2017    CL 107 12/14/2017    CO2 24 12/14/2017    CALCIUM 9.5 12/14/2017     Lab Results   Component Value Date    NA 146 12/14/2017    CL 107 12/14/2017    CO2 24 12/14/2017    ANIONGAP 15 12/14/2017    GLUCOSE 192 12/14/2017    BUN 14 12/14/2017    CREATININE 1.1 12/14/2017    LABGLOM 51 12/14/2017    GFRAA >60 12/14/2017    GFRAA >60 03/16/2012    CALCIUM 9.5 12/14/2017    PROT 7.1 05/05/2017    PROT 7.3 03/31/2011    LABALBU 3.9 05/05/2017    BILITOT <0.2 05/05/2017    ALKPHOS 89 05/05/2017    AST 28 05/05/2017    ALT 41 05/05/2017    GLOB 3.2 05/05/2017     Lab Results   Component Value Date    ALT 41 05/05/2017    AST 28 05/05/2017     Lab Results   Component Value Date    K 4.4 12/14/2017    K 3.9 05/05/2017       Plan and labs reviewed

## 2018-12-29 NOTE — Telephone Encounter (Signed)
Medication Refill    Medication needing refilled: prasugrel (EFFIENT) 10 MG TABS    Doseage of the medication: 10 mg    How are you taking this medication (QD, BID, TID, QID, PRN): 1 tablet daily    30 or 90 day supply called in: 23    Which Pharmacy are we sending the medication to?: [facility] [address] [place] [address] - [phone] fax: [phone]

## 2018-12-30 MED ORDER — PRASUGREL HCL 10 MG PO TABS
10 MG | ORAL_TABLET | Freq: Every day | ORAL | 0 refills | Status: DC
Start: 2018-12-30 — End: 2019-03-22

## 2018-12-30 NOTE — Telephone Encounter (Signed)
Pt states pharmacy does not have rx. Pt states she has been out for a week and is noticing a change. Asking to be called when [facility] has been sent rx.

## 2019-01-04 ENCOUNTER — Encounter: Attending: Dermatology | Primary: Family

## 2019-01-24 ENCOUNTER — Telehealth

## 2019-01-24 NOTE — Telephone Encounter (Signed)
??   They will have to call billing department?

## 2019-01-24 NOTE — Telephone Encounter (Signed)
Pt has pending state insurance and needs to be seen for a acute issue of ear pain. She asked how much is the visit with no insurance until hers goes thru. Thanks

## 2019-01-25 NOTE — Telephone Encounter (Signed)
Left message the visit will be $30 for the copay. Advised to call back for any questions or concerns.

## 2019-01-27 ENCOUNTER — Encounter: Attending: Family Medicine | Primary: Family

## 2019-02-02 ENCOUNTER — Encounter: Attending: Adult Health | Primary: Family

## 2019-02-24 ENCOUNTER — Encounter: Payer: MEDICAID | Attending: Family Medicine | Primary: Family

## 2019-02-24 ENCOUNTER — Encounter

## 2019-02-24 MED ORDER — OMEPRAZOLE 40 MG PO CPDR
40 MG | ORAL_CAPSULE | ORAL | 1 refills | Status: DC
Start: 2019-02-24 — End: 2019-08-21

## 2019-02-24 MED ORDER — MONTELUKAST SODIUM 10 MG PO TABS
10 MG | ORAL_TABLET | ORAL | 1 refills | Status: DC
Start: 2019-02-24 — End: 2019-08-21

## 2019-02-24 MED ORDER — SPIRONOLACTONE 25 MG PO TABS
25 MG | ORAL_TABLET | ORAL | 1 refills | Status: DC
Start: 2019-02-24 — End: 2019-09-19

## 2019-02-24 MED ORDER — LISINOPRIL 40 MG PO TABS
40 MG | ORAL_TABLET | ORAL | 1 refills | Status: DC
Start: 2019-02-24 — End: 2019-08-28

## 2019-02-24 MED ORDER — METOPROLOL TARTRATE 100 MG PO TABS
100 MG | ORAL_TABLET | ORAL | 1 refills | Status: DC
Start: 2019-02-24 — End: 2019-07-13

## 2019-02-24 MED ORDER — RANOLAZINE ER 500 MG PO TB12
500 MG | ORAL_TABLET | ORAL | 1 refills | Status: DC
Start: 2019-02-24 — End: 2019-03-22

## 2019-02-24 NOTE — Telephone Encounter (Signed)
Patient requesting a medication refill.  Medication: lisinopril (PRINIVIL;ZESTRIL) 40 MG tablet  Pharmacy: [facility] [address] [place] [phone] Wanda Plump [phone]   Last office visit: 10/31/20219  Next office visit: 03/10/2019    Patient requesting a medication refill.  Medication: montelukast (SINGULAIR) 10 MG tablet  Pharmacy:[facility] [address], [place] - [address] - P [phone] - F [phone]   Last office visit: 10/31/20219  Next office visit: 03/10/2019    Patient requesting a medication refill.  Medication: omeprazole (PRILOSEC) 40 MG delayed release capsule  Pharmacy: [facility] [address], [place] [phone] Wanda Plump [phone]     Last office visit: 10/31/20219  Next office visit: 03/10/2019    Patient requesting a medication refill.  Medication: metoprolol (LOPRESSOR) 100 MG tablet  Pharmacy: [facility] [address], [place] [phone] Wanda Plump [phone]     Last office visit: 10/31/20219  Next office visit: 03/10/2019    Patient requesting a medication refill.  Medication: spironolactone (ALDACTONE) 25 MG tablet  Pharmacy: [facility] [address] [place] [phone] Wanda Plump [phone]     Last office visit: 10/31/20219  Next office visit: 03/10/2019    Patient requesting a medication refill.  Medication: ranolazine (RANEXA) 500 MG extended release tablet  Pharmacy: [facility] [address] [place] [phone] Wanda Plump [phone]   Last office visit: 10/31/20219  Next office visit: 03/10/2019

## 2019-02-24 NOTE — Telephone Encounter (Signed)
Medication refills sent in

## 2019-02-24 NOTE — Addendum Note (Signed)
Addended byAdrienne Mocha on: 02/24/2019 04:58 PM     Modules accepted: Orders

## 2019-03-01 NOTE — Telephone Encounter (Signed)
Called patient to confirm appointment, patient would like to reschedule 3 wks out.  Please advise regarding medication refills.  pjp

## 2019-03-01 NOTE — Telephone Encounter (Signed)
Refills were sent by the pt's PCP on 02/24/2019. Spoke to the pt and she does not need any refills at this time. She states she finally has insurance coverage for our office. She is asking to reschedule her OV with NPT on 03/03/2019  Pt is asking for an OV on Wednesday's or Friday's only.   Where can this pt be rescheduled to?

## 2019-03-03 ENCOUNTER — Encounter: Attending: Adult Health | Primary: Family

## 2019-03-03 MED ORDER — GABAPENTIN 300 MG PO CAPS
300 MG | ORAL_CAPSULE | Freq: Three times a day (TID) | ORAL | 0 refills | Status: DC
Start: 2019-03-03 — End: 2019-04-07

## 2019-03-03 MED ORDER — ALPRAZOLAM 0.25 MG PO TABS
0.25 MG | ORAL_TABLET | Freq: Every evening | ORAL | 0 refills | Status: AC | PRN
Start: 2019-03-03 — End: 2019-04-02

## 2019-03-03 NOTE — Telephone Encounter (Signed)
Last Appt: 10.11.19  Next Appt: Cancelled per office    Patient states she knows she has not had this but anxiety is up due to Covid, will do virtual visit if needed to get these filled.

## 2019-03-03 NOTE — Telephone Encounter (Signed)
OK for refills

## 2019-03-10 ENCOUNTER — Encounter: Payer: MEDICAID | Attending: Family Medicine | Primary: Family

## 2019-03-20 MED ORDER — FLUOCINONIDE 0.05 % EX SOLN
0.05 % | CUTANEOUS | 1 refills | Status: DC
Start: 2019-03-20 — End: 2019-03-22

## 2019-03-22 ENCOUNTER — Ambulatory Visit: Admit: 2019-03-22 | Discharge: 2019-03-22 | Payer: PRIVATE HEALTH INSURANCE | Attending: Adult Health | Primary: Family

## 2019-03-22 DIAGNOSIS — I2510 Atherosclerotic heart disease of native coronary artery without angina pectoris: Secondary | ICD-10-CM

## 2019-03-22 MED ORDER — TORSEMIDE 20 MG PO TABS
20 MG | ORAL_TABLET | Freq: Every day | ORAL | 1 refills | Status: DC | PRN
Start: 2019-03-22 — End: 2021-12-26

## 2019-03-22 MED ORDER — PRASUGREL HCL 10 MG PO TABS
10 MG | ORAL_TABLET | Freq: Every day | ORAL | 3 refills | Status: DC
Start: 2019-03-22 — End: 2020-02-12

## 2019-03-22 MED ORDER — RANOLAZINE ER 500 MG PO TB12
500 MG | ORAL_TABLET | ORAL | 2 refills | Status: DC
Start: 2019-03-22 — End: 2019-08-10

## 2019-03-22 NOTE — Progress Notes (Signed)
Rachael Mays is a 63 y.o. female evaluated via telephone on 03/22/2019.      Consent:  She and/or health care decision maker is aware that that she may receive a bill for this telephone service, depending on her insurance coverage, and has provided verbal consent to proceed: Yes      Documentation:  I communicated with the patient and/or health care decision maker about CAD.   Details of this discussion including any medical advice provided: Yes      I affirm this is a Patient Initiated Episode with an Established Patient who has not had a related appointment within my department in the past 7 days or scheduled within the next 24 hours.    Total Time: minutes: 21-30 minutes    Note: not billable if this call serves to triage the patient into an appointment for the relevant concern      [place]     Outpatient Follow Up Note    Rachael Mays is 63 y.o. female who presents today with a history of CAD s/p PTCA LAD April '17, d-HF, DVT/PE, s/p [place] filter, HTN and hyperlipidemia.      She has been experiencing chronic chest pain and shortness of breath since April 2017  (03/2016) RHC was also performed which showed PA mean 33 mmHg, PCWP 18 mmHg (transpulmonary gradient 15), PVR 2.134 Wood units.  Symptoms have not improved post stent. She has seen Dr Jeb Levering and CTA/CTPA (03/2016) with no evidence of PE or acute abnormality.   Echo 04/06/16 with concentric LVH, EF 65% and RVSP 42 mmHg.   Repeat LHC (07/2016) for shortness of breath and chest pain demonstrated patent stent LAD.   She was seen in office 01/11/2017 by Dr Jacqualyn Posey and imipramine was to start for chronic chest pain. imipramine in women with chronic chest pain (Syndrome X)  ??  CHIEF COMPLAINT / HPI:  Follow Up secondary to coronary artery disease    Subjective:   she denies significant chest pain. She has times when she gets discomfort down arms / shoulders feel heavy. Its brought on my exercising, ie holding onto a  grocery cart or when walking on her treadmill. It feels similar to her angina symptoms. It last until she sits down or folds her arms in front of her. Its happened at work Conservation officer, nature at [employer]). Its been ongoing for a couple of years. She wasn't having it as often when she was on Ranexa. Her insurance stopped covering it so she'd been without it for a couple of months.     There is SOB. She describes not being able to take a deep breath noticeable when waking in the morning but also happens during the daytime. The patient denies orthopnea/PND. She sleeps with a BiPAP. The patient has swelling at times. She has torsemide to take prn. She takes it on average once weekly. She notices swelling when she's up on her feet too long. The patients weight is up to 304#: 8# gain / 2 yrs by office scales. The patient is not experiencing palpitations or dizziness.   These symptoms show no change since the last OV.   With regard to medication therapy the patient has been compliant with prescribed regimen. They have tolerated therapy to date.     Past Medical History:   Diagnosis Date   ??? Anxiety    ??? Asthma    ??? Blood circulation, collateral    ???  CAD (coronary artery disease)    ??? CHF (congestive heart failure) ([place])    ??? Chronic back pain    ??? Deep vein thrombosis    ??? 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 Depression    ??? GERD (gastroesophageal reflux disease)     NO LONGER SINCE LAP   ??? GERD (gastroesophageal reflux disease) 01/23/2009   ??? Hx of blood clots    ??? Hyperlipidemia     hx; resolved with lap band   ??? Hypertension    ??? Idiopathic peripheral neuropathy 03/26/2017   ??? Melanoma ([place])     upper back   ??? Obesity     hx of; had lap band   ??? Obstructive sleep apnea (adult) (pediatric) 09/18/2015   ??? Pulmonary embolism ([place])      Social History:    Social History     Tobacco Use   Smoking Status Former Smoker   ??? Packs/day: 0.50   ??? Years: 40.00   ??? Pack years: 20.00   ??? Types: Cigarettes   ??? Last attempt to  quit: 08/07/2012   ??? Years since quitting: 6.6   Smokeless Tobacco Former User   ??? Quit date: 06/16/2013   Tobacco Comment    started to smoke at age 13 / only smoked 0.5 p.p.d      Current Medications:  Current Outpatient Medications   Medication Sig Dispense Refill   ??? fluocinonide (LIDEX) 0.05 % external solution APPLY SPARINGLY TO THE BACK OF THE SCALP DAILY IF NEEDED FOR ITCHING 60 mL 1   ??? gabapentin (NEURONTIN) 300 MG capsule Take 1 capsule by mouth 3 times daily for 30 days. 90 capsule 0   ??? ALPRAZolam (XANAX) 0.25 MG tablet Take 1 tablet by mouth nightly as needed for Sleep for up to 30 days. 30 tablet 0   ??? spironolactone (ALDACTONE) 25 MG tablet TAKE ONE AND ONE-HALF TABLETS BY MOUTH EVERY DAY (Patient taking differently: Take 25 mg by mouth daily TAKE ONE AND ONE-HALF TABLETS BY MOUTH EVERY DAY/ patient taking one tablet daily) 135 tablet 1   ??? ranolazine (RANEXA) 500 MG extended release tablet TAKE ONE TABLET BY MOUTH TWICE A DAY 180 tablet 1   ??? metoprolol (LOPRESSOR) 100 MG tablet TAKE 1 AND 1/2 TABLET BY MOUTH TWO TIMES A DAY 270 tablet 1   ??? omeprazole (PRILOSEC) 40 MG delayed release capsule TAKE 1 CAPSULE BY MOUTH ONE TIME A DAY 90 capsule 1   ??? lisinopril (PRINIVIL;ZESTRIL) 40 MG tablet TAKE 1 TABLET BY MOUTH ONE TIME A DAY 90 tablet 1   ??? montelukast (SINGULAIR) 10 MG tablet TAKE 1 TABLET BY MOUTH ONE TIME A DAY 90 tablet 1   ??? prasugrel (EFFIENT) 10 MG TABS Take 1 tablet by mouth daily 90 tablet 0   ??? diclofenac sodium 1 % GEL Apply 2 g topically 2 times daily 1 Tube 3   ??? triamcinolone (KENALOG) 0.1 % cream Apply to affected area twice daily for up to 2 weeks or until improved. 45 g 2   ??? nitroGLYCERIN (NITROSTAT) 0.4 MG SL tablet up to max of 3 total doses. If no relief after 1 dose, call 911. 25 tablet 3   ??? aspirin 81 MG EC tablet Take 1 tablet by mouth daily 30 tablet 11   ??? triamcinolone (KENALOG) 0.1 % cream Apply topically 2 times daily.Apply with Q tip in each ear BID for 1 week. 15 g 1    ???  mupirocin (BACTROBAN) 2 % ointment Apply topically  Daily.Apply with Q tip in each naris at night 15 g 0   ??? albuterol sulfate HFA (PROVENTIL HFA) 108 (90 BASE) MCG/ACT inhaler Inhale 2 puffs into the lungs every 6 hours as needed for Wheezing 3 Inhaler 3   ??? Multiple Vitamins-Minerals (MULTIVITAMIN PO) Take 1 tablet by mouth daily      ??? CALCIUM-VITAMIN D PO Take 1 tablet by mouth daily      ??? cetirizine (ZYRTEC ALLERGY) 10 MG tablet Take 10 mg by mouth daily.     ??? zoster recombinant adjuvanted vaccine (SHINGRIX) 50 MCG SUSR injection .84mL IM X 1 followed by .94mL IM 2-6 months after dose #1 0.5 mL 0     No current facility-administered medications for this visit.      REVIEW OF SYSTEMS:    CONSTITUTIONAL: + weight gain, - fatigue, weakness, night sweats or fever.  HEENT: No new vision difficulties or ringing in the ears.  RESPIRATORY: No new SOB, PND, orthopnea or cough.   CARDIOVASCULAR: See HPI  GI: No nausea, vomiting, diarrhea, constipation, abdominal pain or changes in bowel habits.  GU: No urinary frequency, urgency, incontinence hematuria or dysuria.  SKIN: No cyanosis or skin lesions.  MUSCULOSKELETAL: No new muscle or joint pain.  NEUROLOGICAL: No syncope or TIA-like symptoms.  PSYCHIATRIC: No anxiety, pain, insomnia or depression    Objective:   Vitals taken by pt  VITALS:  BP (!) 148/74 (Site: Left Upper Arm, Position: Sitting, Cuff Size: Large Adult)    Pulse 55    Ht 5\' 6"  (1.676 m)    Wt (!) 304 lb 3.2 oz (138 kg)    Breastfeeding No    BMI 49.10 kg/m??       DATA:    Lab Results   Component Value Date    ALT 41 (H) 05/05/2017    AST 28 05/05/2017    ALKPHOS 89 05/05/2017    BILITOT <0.2 05/05/2017     Lab Results   Component Value Date    CREATININE 1.1 12/14/2017    BUN 14 12/14/2017    NA 146 (H) 12/14/2017    K 4.4 12/14/2017    CL 107 12/14/2017    CO2 24 12/14/2017     Lab Results   Component Value Date    TSH 1.37 08/22/2015    T4TOTAL 6.6 04/17/2014     Lab Results   Component Value Date     WBC 5.8 05/07/2017    HGB 13.0 05/07/2017    HCT 39.1 05/07/2017    MCV 88.0 05/07/2017    PLT 168 05/07/2017     No components found for: CHLPL  Lab Results   Component Value Date    TRIG 114 06/17/2016    TRIG 100 05/21/2016    TRIG 137 05/04/2014     Lab Results   Component Value Date    HDL 43 06/17/2016    HDL 42 05/21/2016    HDL 49 05/04/2014     Lab Results   Component Value Date    LDLCALC 52 06/17/2016    LDLCALC 65 05/21/2016    LDLCALC 132 (H) 05/04/2014     Lab Results   Component Value Date    LABVLDL 23 06/17/2016    LABVLDL 20 05/21/2016    LABVLDL 27 05/04/2014     Radiology Review:  Pertinent images / reports were reviewed as a part of this visit and reveals the following:    ??  Last Angiogram: 05/07/17:  Anatomy:??  LM-normal  LAD-normal, widely patent stent mid  Cx-normal  OM1- normal  RCA-normal, right dominant  RPDA- normal  LVEF- 60%  ??  Impression:  1. ??Normal coronary artery anatomy.  2. ??Widely patent mid LAD stent.  3. ??Normal LV systolic function.  4. ??False positive stress test. ??Likely breast artifact  ??    Last Stress Test: 05/06/17:  Summary ??  No EKG evidence for ischemia with exercise ??Normal LV size and systolic function. ??Myocardial perfusion imaging with moderate sized, moderate severity area of ??decreased uptake in the anterior, anteroapical and anterolateral walls with ??stress with redistribution at rest consistent with ischemia in the territory ??of the LAD. ??      Cardiac Cath 07/17/16:  Anatomy:??  LM-normal  LAD-widely patent stent otherwise normal  Cx-normal, dominant  OM1- normal  RCA-normal  LVEF- 70%, LVEDP 15 mmHg  ??  Impression:  1. ??Widely patent LAD stent otherwise normal coronary arteries  2. ??Hyperdynamic LV systolic function  3. ??Non-cardiac chest pain  ??  R/L Cardiac Cath 03/31/16:  Anatomy:??  LM-normal??  LAD-70% mid with FFR 0.77  Cx-normal, codominant  OM1- normal  RCA-normal  RPDA- normal  LVEF- 70%, LVEDP 19   PCI: LAD 70% to 0% with 3.25 mm x 18 mm Xience Alpine  drug-eluting stent  ??  Hemodynamics:  RA-15/12 mean 9  RV- 60/0, 9  PAWP-20/26 mean??19  PA- 60/12 mean 33  ??  ??  Last Echo: May '17:  Summary  ??Normal left ventricle size and systolic function with an estimated ejection??fraction of 65%.  ??There is mild concentric left ventricular hypertrophy.  ??Mild mitral regurgitation is present.  ??The aortic valve appears sclerotic but opens well.  ??There is mild tricuspid regurgitation with RVSP estimated at 42 mmHg.  ??Mild pulmonic regurgitation present.  ??Definity was used to better delineate endocardium.  ??    Assessment:      Diagnosis Orders   1. Coronary artery disease due to lipid rich plaque   ~c/o arm/shoulder heaviness with exercise and c/o SOB  ~frequency increased during time without Ranexa  ~s/p PTCA LAD '17  ~DAPT / BB  Comprehensive Metabolic Panel    Lipid, Fasting    Stress test, lexiscan   2. Essential hypertension   ~suboptimal by home report    3. Chronic diastolic congestive heart failure (HCC)   ~c/o swelling / wt gain.   ~no longer taking aldactone 37.5 mg daily as she had reduced her dose to 25 mg daily to conserve on supply    4. Shortness of breath   ~multifactorial : OSA vs body habitus/deconditioning vs volume overload.   Accomplished smoking cessation ~ 6 yrs ago        I had the opportunity to review the clinical symptoms and presentation of Rachael Mays.   Plan:     1. LexiScan myoview   2. Fasting lipid profile / CMP as routine   ~continue current dose of spironolactone until labs return  3. F/U in six months depending on myoview results    Overall the patient is stable from CV standpoint    I have addresed the patient's cardiac risk factors and adjusted pharmacologic treatment as needed. In addition, I have reinforced the need for patient directed risk factor modification.    Further evaluation will be based upon the patient's clinical course and testing results.    All questions and concerns were addressed to the patient/family. Alternatives  to my  treatment were discussed.      The patient is not currently smoking: quit 6 yrs ago. The risks related to smoking were reviewed with the patient. Recommend maintaining a smoke-free lifestyle.     Patient is on a beta-blocker  Patient is on an ace-i  Patient is not on a statin : stated intolerant (atorvastatin / zetia )    Dual Antiplatelet therapy has been recommended / prescribed for this patient. Education conducted on adverse reactions including bleeding was discussed.    The patient verbalizes understanding not to stop medications without discussing with [place].    Discussed exercise: 30-60 minutes 7 days/week  Discussed Low saturated fat diet.     Thank you for allowing to [place] to participate in the care of Rachael Mays.    Luiz Iron, APRN    Documentation of today's visit sent to PCP

## 2019-03-22 NOTE — Progress Notes (Deleted)
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 1 Year Follow Up     Patient c/o bilateral pain   ??? Hypertension   ??? Coronary Artery Disease   ??? Congestive Heart Failure       Rachael Mays (DOB:  [birth year]) has requested an audio/video evaluation for the following concern(s): CAD last seen June '18      Prior to Visit Medications    Medication Sig Taking? Authorizing Provider   fluocinonide (LIDEX) 0.05 % external solution APPLY SPARINGLY TO THE BACK OF THE SCALP DAILY IF NEEDED FOR ITCHING  Francisca December, MD   gabapentin (NEURONTIN) 300 MG capsule Take 1 capsule by mouth 3 times daily for 30 days.  Stephan Minister, MD   ALPRAZolam Duanne Moron) 0.25 MG tablet Take 1 tablet by mouth nightly as needed for Sleep for up to 30 days.  Stephan Minister, MD   spironolactone (ALDACTONE) 25 MG tablet TAKE ONE AND ONE-HALF TABLETS BY MOUTH EVERY DAY  Stephan Minister, MD   ranolazine (RANEXA) 500 MG extended release tablet TAKE ONE TABLET BY MOUTH TWICE A DAY  Stephan Minister, MD   metoprolol (LOPRESSOR) 100 MG tablet TAKE 1 AND 1/2 TABLET BY MOUTH TWO TIMES A DAY  Stephan Minister, MD   omeprazole (PRILOSEC) 40 MG delayed release capsule TAKE 1 CAPSULE BY MOUTH ONE TIME A DAY  Stephan Minister, MD   lisinopril (PRINIVIL;ZESTRIL) 40 MG tablet TAKE 1 TABLET BY MOUTH ONE TIME A DAY  Stephan Minister, MD   montelukast (SINGULAIR) 10 MG tablet TAKE 1 TABLET BY MOUTH ONE TIME A DAY  Stephan Minister, MD   prasugrel (EFFIENT) 10 MG TABS Take 1 tablet by mouth daily  Luiz Iron, APRN - CNP   diclofenac sodium 1 % GEL Apply 2 g topically 2 times daily  Stephan Minister, MD   triamcinolone (KENALOG) 0.1 % cream Apply to affected area twice daily for up to 2 weeks or until improved.  Francisca December, MD   nitroGLYCERIN (NITROSTAT) 0.4 MG  SL tablet up to max of 3 total doses. If no relief after 1 dose, call 911.  Harvel Quale, MD   zoster recombinant adjuvanted vaccine [facility]) 50 MCG SUSR injection .53mL IM X 1 followed by .65mL IM 2-6 months after dose #1  Harvel Quale, MD   aspirin 81 MG EC tablet Take 1 tablet by mouth daily  Harvel Quale, MD   triamcinolone (KENALOG) 0.1 % cream Apply topically 2 times daily.Apply with Q tip in each ear BID for 1 week.  Alvina Chou, MD   mupirocin [facility]) 2 % ointment Apply topically  Daily.Apply with Q tip in each naris at night  Alvina Chou, MD   albuterol sulfate HFA (PROVENTIL HFA) 108 (90 BASE) MCG/ACT inhaler Inhale 2 puffs into the lungs every 6 hours as needed for Wheezing  Harvel Quale, MD   Multiple Vitamins-Minerals (MULTIVITAMIN PO) Take 1 tablet by mouth daily   Historical Provider, MD   CALCIUM-VITAMIN D PO Take 1 tablet by mouth daily   Historical Provider, MD   cetirizine (ZYRTEC ALLERGY) 10 MG tablet Take 10 mg by mouth daily.  Historical Provider, MD       Social History  Tobacco Use   ??? Smoking status: Former Smoker     Packs/day: 0.50     Years: 40.00     Pack years: 20.00     Types: Cigarettes     Last attempt to quit: 08/07/2012     Years since quitting: 6.6   ??? Smokeless tobacco: Former Systems developer     Quit date: 06/16/2013   ??? Tobacco comment: started to smoke at age 63 / only smoked 0.5 p.p.d    Substance Use Topics   ??? Alcohol use: No     Alcohol/week: 0.0 standard drinks   ??? Drug use: No        REVIEW OF SYSTEMS:    CONSTITUTIONAL: No major weight gain or loss, fatigue, weakness, night sweats or fever.  HEENT: No new vision difficulties or ringing in the ears.  RESPIRATORY: No new SOB, PND, orthopnea or cough.   CARDIOVASCULAR: See HPI  GI: No nausea, vomiting, diarrhea, constipation, abdominal pain or changes in bowel habits.  GU: No urinary frequency, urgency, incontinence hematuria or dysuria.  SKIN: No cyanosis or skin  lesions.  MUSCULOSKELETAL: No new muscle or joint pain.  NEUROLOGICAL: No syncope or TIA-like symptoms.  PSYCHIATRIC: No anxiety, pain, insomnia or depression    CHIEF COMPLAINT / HPI:  Follow Up secondary to {Diagnoses; cardiac:5736}    Subjective:   she {Actions; denies/reports/admits to:19208} significant chest pain. There {is/is no:19420} SOB/DOE. The patient denies orthopnea/PND. The patient does not have swelling. The patients weight is ___ . The patient {QQ:59563} experiencing palpitations or dizziness.   These symptoms {Improving/worsening/no change:60406} since the last OV.   With regard to medication therapy the patient {HAS HAS OVF:64332} been compliant with prescribed regimen. They {HAVE/HAVE NOT:19739} tolerated therapy to date.         {F2 for Optional History SmartBlocks:28118}    PHYSICAL EXAMINATION:  [ INSTRUCTIONS:  "[x] " Indicates a positive item  "[] " Indicates a negative item      Vital Signs: (As obtained by patient/caregiver or practitioner observation)  VITALS:  BP (!) 148/74 (Site: Left Upper Arm, Position: Sitting, Cuff Size: Large Adult)    Pulse 55    Ht 5\' 6"  (1.676 m)    Wt (!) 304 lb 3.2 oz (138 kg)    Breastfeeding No    BMI 49.10 kg/m??     Blood pressure-  Heart rate-    Respiratory rate-    Temperature-  Pulse oximetry-     Constitutional: []  Appears well-developed and well-nourished []  No apparent distress      []  Abnormal-   Mental status  []  Alert and awake  []  Oriented to person/place/time [] Able to follow commands      Eyes:  EOM    []   Normal  []  Abnormal-  Sclera  []   Normal  []  Abnormal -         Discharge []   None visible  []  Abnormal -    HENT:   []  Normocephalic, atraumatic.  []  Abnormal   []  Mouth/Throat: Mucous membranes are moist.     External Ears []  Normal  []  Abnormal-     Neck: []  No visualized mass     Pulmonary/Chest: []  Respiratory effort normal.  []  No visualized signs of difficulty breathing or respiratory distress        []  Abnormal-      Musculoskeletal:    []  Normal gait with no signs of ataxia         []  Normal range  of motion of neck        []  Abnormal-       Neurological:        []  No Facial Asymmetry (Cranial nerve 7 motor function) (limited exam to video visit)          []  No gaze palsy        []  Abnormal-         Skin:        []  No significant exanthematous lesions or discoloration noted on facial skin         []  Abnormal-            Psychiatric:       []  Normal Affect []  No Hallucinations        []  Abnormal-     Other pertinent observable physical exam findings-     ASSESSMENT/PLAN:  There are no diagnoses linked to this encounter.    No follow-ups on file.    Rachael Mays is a 63 y.o. female being evaluated by a Virtual Visit (video visit) encounter to address concerns as mentioned above.  A caregiver was present when appropriate. 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  Pursuant to the emergency declaration under the [place], [place] waiver authority and the [employer] Act, this Virtual Visit was conducted with patient's (and/or legal guardian's) consent, to reduce the patient's risk of exposure to COVID-19 and provide necessary medical care.  The patient (and/or legal guardian) has also been advised to contact this office for worsening conditions or problems, and seek emergency medical treatment and/or call 911 if deemed necessary.     Services were provided through a video synchronous discussion virtually to substitute for in-person clinic visit. Patient and provider were located at their individual homes.    --Luiz Iron, APRN - CNP on 03/22/2019 at 8:47 AM    An electronic signature was used to authenticate this note.       Objective:     DATA:    Lab Results   Component Value Date    ALT 41  (H) 05/05/2017    AST 28 05/05/2017    ALKPHOS 89 05/05/2017    BILITOT <0.2 05/05/2017     Lab Results   Component Value Date    CREATININE 1.1 12/14/2017    BUN 14 12/14/2017    NA 146 (H) 12/14/2017    K 4.4 12/14/2017    CL 107 12/14/2017    CO2 24 12/14/2017     Lab Results   Component Value Date    TSH 1.37 08/22/2015    T4TOTAL 6.6 04/17/2014     Lab Results   Component Value Date    WBC 5.8 05/07/2017    HGB 13.0 05/07/2017    HCT 39.1 05/07/2017    MCV 88.0 05/07/2017    PLT 168 05/07/2017     No components found for: CHLPL  Lab Results   Component Value Date    TRIG 114 06/17/2016    TRIG 100 05/21/2016    TRIG 137 05/04/2014     Lab Results   Component Value Date    HDL 43 06/17/2016    HDL 42 05/21/2016    HDL 49 05/04/2014     Lab Results   Component Value Date    LDLCALC 52 06/17/2016    LDLCALC 65 05/21/2016  LDLCALC 132 (H) 05/04/2014     Lab Results   Component Value Date    LABVLDL 23 06/17/2016    LABVLDL 20 05/21/2016    LABVLDL 27 05/04/2014       Radiology Review:  Pertinent images / reports were reviewed as a part of this visit and reveals the following:    Last Stress Test: 05/06/17:  Summary ??No EKG evidence for ischemia with exercise ??Normal LV size and systolic function. ??Myocardial perfusion imaging with moderate sized, moderate severity area of ??decreased uptake in the anterior, anteroapical and anterolateral walls with ??stress with redistribution at rest consistent with ischemia in the territory ??of the LAD. ??  ??  Last Angiogram: 05/07/17:  Anatomy:??  LM-normal  LAD-normal, widely patent stent mid  Cx-normal  OM1- normal  RCA-normal, right dominant  RPDA- normal  LVEF- 60%  ??  Impression:  1. ??Normal coronary artery anatomy.  2. ??Widely patent mid LAD stent.  3. ??Normal LV systolic function.  4. ??False positive stress test. ??Likely breast artifact      Assessment:      Diagnosis Orders   1. Coronary artery disease due to lipid rich plaque     2. Essential hypertension           I had the  opportunity to review the clinical symptoms and presentation of KARLIAH KOWALCHUK.   Plan:         Overall the patient is stable from CV standpoint    I have addresed the patient's cardiac risk factors and adjusted pharmacologic treatment as needed. In addition, I have reinforced the need for patient directed risk factor modification.    Further evaluation will be based upon the patient's clinical course and testing results.    All questions and concerns were addressed to the patient/family. Alternatives to my treatment were discussed.      The patient {IS:19891} currently smoking. The risks related to smoking were reviewed with the patient. Recommend maintaining a smoke-free lifestyle. Products available for smoking cessation were discussed in detail.    Patient is on a beta-blocker  Patient is on an ace-i/ARB  Patient is on a statin    Dual Antiplatelet therapy / anti-coagulation {HAS HAS XNA:35573} been recommended / prescribed for this patient. Education conducted on adverse reactions including bleeding was discussed.    Angiotension inhibitor/angiotension receptor blocker has __ been prescribed / recommended for congestive heart failure. Daily weight, low sodium diet were discussed. Patient instructed to call the office with a weight gain: > 3 # over night or 5# in one week; swelling, SOB/orthopnea/PND    The patient verbalizes understanding not to stop medications without discussing with [place].    Discussed exercise: 30-60 minutes 7 days/week  Discussed Low saturated fat/NAS diet.     Thank you for allowing to [place] to participate in the care of ROSELY FERNANDEZ.    Luiz Iron, APRN    Documentation of today's visit sent to PCP

## 2019-03-22 NOTE — Patient Instructions (Signed)
Fasting cholesterol levels as routine    Chemical stress test since you've had arm discomfort    appt in six months as long as your test looks ok

## 2019-03-23 ENCOUNTER — Inpatient Hospital Stay: Payer: PRIVATE HEALTH INSURANCE | Primary: Family

## 2019-03-23 DIAGNOSIS — I2510 Atherosclerotic heart disease of native coronary artery without angina pectoris: Secondary | ICD-10-CM

## 2019-03-23 LAB — COMPREHENSIVE METABOLIC PANEL
ALT: 30 U/L (ref 10–40)
AST: 27 U/L (ref 15–37)
Albumin/Globulin Ratio: 1.1 (ref 1.1–2.2)
Albumin: 3.8 g/dL (ref 3.4–5.0)
Alkaline Phosphatase: 97 U/L (ref 40–129)
Anion Gap: 14 (ref 3–16)
BUN: 21 mg/dL — ABNORMAL HIGH (ref 7–20)
CO2: 26 mmol/L (ref 21–32)
Calcium: 9.5 mg/dL (ref 8.3–10.6)
Chloride: 101 mmol/L (ref 99–110)
Creatinine: 0.9 mg/dL (ref 0.6–1.2)
GFR African American: >60 (ref >60)
GFR Non-African American: >60 (ref >60)
Globulin: 3.5 g/dL
Glucose: 120 mg/dL — ABNORMAL HIGH (ref 70–99)
Potassium: 4.6 mmol/L (ref 3.5–5.1)
Sodium: 141 mmol/L (ref 136–145)
Total Bilirubin: 0.6 mg/dL (ref 0.0–1.0)
Total Protein: 7.3 g/dL (ref 6.4–8.2)

## 2019-03-23 LAB — LIPID, FASTING
Cholesterol, Fasting: 190 mg/dL (ref 0–199)
HDL: 48 mg/dL (ref 40–60)
LDL Calculated: 115 mg/dL — ABNORMAL HIGH (ref <100)
Triglyceride, Fasting: 133 mg/dL (ref 0–150)
VLDL Cholesterol Calculated: 27 mg/dL

## 2019-03-24 ENCOUNTER — Telehealth

## 2019-03-24 MED ORDER — ROSUVASTATIN CALCIUM 10 MG PO TABS
10 MG | ORAL_TABLET | Freq: Every day | ORAL | 2 refills | Status: DC
Start: 2019-03-24 — End: 2019-06-26

## 2019-03-24 NOTE — Telephone Encounter (Signed)
Patient returned call. Notified patient of NPTS instructions below. Patient verbalized understanding and has no questions at this time. Sent Rosuvastatin 10 mg to [facility] pharmacy for patient. Placed orders for Lipid panel and CMP in 6 weeks.

## 2019-03-24 NOTE — Telephone Encounter (Signed)
Pt calling back wants to know the mg on the CoQ 10 she should take? Also can she take it with Vit D 3? Pls call to advise Thank you

## 2019-03-24 NOTE — Telephone Encounter (Signed)
Please advise thanks.

## 2019-03-24 NOTE — Telephone Encounter (Signed)
Call placed to patient who was not available for phone conversation. Could not leave a message due to no vm. Will need to try the patient again later today.

## 2019-03-24 NOTE — Telephone Encounter (Signed)
-----   Message from Clinton Quant, APRN - CNP sent at 03/24/2019  7:39 AM EDT -----  LDL not at goal. Have her start low dose rosuvastatin 10 mg daily and recheck lipids and CMP after 6 weeks  Have her take CoQ 10 OTC along with the rosuvastatin     No change with spironolactone dose. Have her work on dietary management for wt loss ; avoid added salt

## 2019-03-27 NOTE — Telephone Encounter (Signed)
Ok to take with Vitamin-D ; she can take 100-200 mg daily

## 2019-03-27 NOTE — Telephone Encounter (Signed)
Attempted to speak with the patient who was not available for message. Will need to try her again later today.

## 2019-03-27 NOTE — Telephone Encounter (Signed)
Spoke to pt and gave instructions.

## 2019-03-29 ENCOUNTER — Inpatient Hospital Stay: Admit: 2019-03-29 | Payer: PRIVATE HEALTH INSURANCE | Primary: Family

## 2019-03-29 DIAGNOSIS — I2510 Atherosclerotic heart disease of native coronary artery without angina pectoris: Secondary | ICD-10-CM

## 2019-03-29 LAB — NM MYOCARDIAL SPECT REST EXERCISE OR RX: Left Ventricular Ejection Fraction: 67

## 2019-03-29 MED ORDER — TECHNETIUM TC 99M TETROFOSMIN IV KIT
Freq: Once | INTRAVENOUS | Status: AC | PRN
Start: 2019-03-29 — End: 2019-03-29
  Administered 2019-03-29: 13:00:00 10 via INTRAVENOUS

## 2019-03-29 MED ORDER — NORMAL SALINE FLUSH 0.9 % IV SOLN
0.9 | INTRAVENOUS | Status: AC
Start: 2019-03-29 — End: 2019-03-29

## 2019-03-29 MED ORDER — REGADENOSON 0.4 MG/5ML IV SOLN
0.4 | INTRAVENOUS | Status: AC
Start: 2019-03-29 — End: 2019-03-29

## 2019-03-29 MED ORDER — REGADENOSON 0.4 MG/5ML IV SOLN
0.4 MG/5ML | Freq: Once | INTRAVENOUS | Status: AC | PRN
Start: 2019-03-29 — End: 2019-03-29
  Administered 2019-03-29: 14:00:00 0.4 mg via INTRAVENOUS

## 2019-03-29 MED ORDER — TECHNETIUM TC 99M TETROFOSMIN IV KIT
Freq: Once | INTRAVENOUS | Status: AC | PRN
Start: 2019-03-29 — End: 2019-03-29
  Administered 2019-03-29: 14:00:00 30 via INTRAVENOUS

## 2019-03-29 MED FILL — SODIUM CHLORIDE FLUSH 0.9 % IV SOLN: 0.9 % | INTRAVENOUS | Qty: 10

## 2019-03-29 MED FILL — LEXISCAN 0.4 MG/5ML IV SOLN: 0.4 MG/5ML | INTRAVENOUS | Qty: 5

## 2019-03-29 NOTE — Telephone Encounter (Signed)
Attempted to call and speak with the patient who was not available for comment. Per Hippa lm pertaining to results on vm. Patient was encouraged to call with any questions or concerns.

## 2019-03-29 NOTE — Telephone Encounter (Signed)
-----   Message from Clinton Quant, APRN - CNP sent at 03/29/2019  1:09 PM EDT -----  Looks ok

## 2019-03-29 NOTE — Progress Notes (Signed)
Instructed on Lexiscan Stress Test Procedure including possible side effects/ adverse reactions. Patient verbalizes  understanding and denies having any questions .See Epic Cardiology

## 2019-03-31 MED ORDER — NITROGLYCERIN 0.4 MG SL SUBL
0.4 MG | ORAL_TABLET | SUBLINGUAL | 3 refills | Status: DC
Start: 2019-03-31 — End: 2021-06-18

## 2019-03-31 NOTE — Telephone Encounter (Signed)
Medication Refill    Medication needing refilled: nitroGLYCERIN (NITROSTAT) PLEASE SEND TO [facility]        Doseage of the medication: 0.4 mg    How are you taking this medication (QD, BID, TID, QID, PRN):    30 or 90 day supply called in:    Which Pharmacy are we sending the medication to?:    [facility] [address] [place] [phone] - F [phone]

## 2019-03-31 NOTE — Telephone Encounter (Signed)
Lrf 12/31/2017 Disp 25+3  Lov 03/22/2019  Appt 09/20/2019  Please advise thanks.

## 2019-04-05 ENCOUNTER — Emergency Department: Admit: 2019-04-06 | Payer: PRIVATE HEALTH INSURANCE | Primary: Family

## 2019-04-05 DIAGNOSIS — M79605 Pain in left leg: Secondary | ICD-10-CM

## 2019-04-05 NOTE — ED Provider Notes (Signed)
[facility] EMERGENCY DEPARTMENT  EMERGENCY DEPARTMENT ENCOUNTER        Pt Name: Rachael Mays  MRN: [id number]  Birthdate 09-Aug-1956  Date of evaluation: 04/05/2019  Provider: Dalene Carrow, PA-C  PCP: Adrienne Mocha, MD    Evaluation by APP.  My supervising physician was available for consultation.    CHIEF COMPLAINT       Chief Complaint   Patient presents with   . Leg Pain     Patient presents to ER with complaints of posterior low leg pain. Hx of DVT. Denies chest pain/shortness of breath.        HISTORY OF PRESENT ILLNESS   (Location, Timing/Onset, Context/Setting, Quality, Duration, Modifying Factors, Severity, Associated Signs and Symptoms)  Note limiting factors.     GENIVIEVE Mays is a 63 y.o. female that presents to the emergency department with a chief complaint of left leg pain that started 2-1/2 weeks ago but got worse yesterday.  Patient states that she works at [employer] and has been off for multiple weeks due to the pandemic and went back to work yesterday.  Half weeks ago began with some pain that started in the back of her left knee that she describes as soreness that radiates to her left hip.  She was still able to ambulate but has been less mobile than normal and she was not working.  Went to work yesterday and after being on her feet all day the pain became worse.  She rates it a 9 out of 10.  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  She does have IVC filter.  She states this feels like her DVT she has had in the past.  She denies chest pain, shortness of breath, vomiting, fevers, injury, trauma, wound.  Has history of CHF some chronic swelling.  States movement and bearing weight makes the pain worse.  Has history of some chronic back pain denies any change in this.    Nursing Notes were all reviewed and agreed with or any disagreements were addressed in the HPI.    REVIEW OF SYSTEMS     (2-9 systems for level 4, 10 or more for level 5)     Review of Systems    Positives and Pertinent negatives as per HPI.  Except as noted above in the ROS, all other systems were reviewed and negative.       PAST MEDICAL HISTORY     Past Medical History:   Diagnosis Date   . Anxiety    . Asthma    . Blood circulation, collateral    . CAD (coronary artery disease)    . CHF (congestive heart failure) (HCC)    . Chronic back pain    . Deep vein thrombosis    . Degeneration of thoracic intervertebral disc 10/15/2017   . Depression    . GERD (gastroesophageal reflux disease)     NO LONGER SINCE LAP   . GERD (gastroesophageal reflux disease) 01/23/2009   . Hx of blood clots    . Hyperlipidemia     hx; resolved with lap band   . Hypertension    . Idiopathic peripheral neuropathy 03/26/2017   . Melanoma (HCC)     upper back   . Obesity     hx of; had lap band   . Obstructive sleep apnea (adult) (pediatric) 09/18/2015   . Pulmonary embolism (HCC)  SURGICAL HISTORY     Past Surgical History:   Procedure Laterality Date   . BACK SURGERY      neck  plates   . BREAST SURGERY      left lumpectomy   . CESAREAN SECTION     . CHOLECYSTECTOMY     . COLONOSCOPY     . COLONOSCOPY  04/08/10   . CORONARY ANGIOPLASTY     . DIAGNOSTIC CARDIAC CATH LAB PROCEDURE     . DILATATION, ESOPHAGUS     . ENDOSCOPY, COLON, DIAGNOSTIC     . HYSTERECTOMY, VAGINAL     . LAP BAND  05/01/08    Dr. Suzi Roots   . OTHER SURGICAL HISTORY      [place] Filter: curently in place   . OTHER SURGICAL HISTORY  07/05/13    lap band removal   . SHOULDER SURGERY     . SKIN CANCER EXCISION  12/29/2017   . UPPER GASTROINTESTINAL ENDOSCOPY  05/19/13   . UPPER GASTROINTESTINAL ENDOSCOPY  07/21/13    ESOPHAGEAL STENT PLACEMENT   . UPPER GASTROINTESTINAL ENDOSCOPY N/A 07/31/2014    Esophagogastroduodenoscopy with esophageal balloon dilation   . VASCULAR SURGERY  04/2015    Donzetta Sprung, celiac artery angiogram, normal abd arteries         CURRENTMEDICATIONS       Previous  Medications    ALBUTEROL SULFATE HFA (PROVENTIL HFA) 108 (90 BASE) MCG/ACT INHALER    Inhale 2 puffs into the lungs every 6 hours as needed for Wheezing    ASPIRIN 81 MG EC TABLET    Take 1 tablet by mouth daily    CALCIUM-VITAMIN D PO    Take 1 tablet by mouth daily     CETIRIZINE (ZYRTEC ALLERGY) 10 MG TABLET    Take 10 mg by mouth daily.    DICLOFENAC SODIUM 1 % GEL    Apply 2 g topically 2 times daily    GABAPENTIN (NEURONTIN) 300 MG CAPSULE    Take 1 capsule by mouth 3 times daily for 30 days.    LISINOPRIL (PRINIVIL;ZESTRIL) 40 MG TABLET    TAKE 1 TABLET BY MOUTH ONE TIME A DAY    METOPROLOL (LOPRESSOR) 100 MG TABLET    TAKE 1 AND 1/2 TABLET BY MOUTH TWO TIMES A DAY    MONTELUKAST (SINGULAIR) 10 MG TABLET    TAKE 1 TABLET BY MOUTH ONE TIME A DAY    MULTIPLE VITAMINS-MINERALS (MULTIVITAMIN PO)    Take 1 tablet by mouth daily     MUPIROCIN (BACTROBAN) 2 % OINTMENT    Apply topically  Daily.Apply with Q tip in each naris at night    NITROGLYCERIN (NITROSTAT) 0.4 MG SL TABLET    up to max of 3 total doses. If no relief after 1 dose, call 911.    OMEPRAZOLE (PRILOSEC) 40 MG DELAYED RELEASE CAPSULE    TAKE 1 CAPSULE BY MOUTH ONE TIME A DAY    PRASUGREL (EFFIENT) 10 MG TABS    Take 1 tablet by mouth daily    RANOLAZINE (RANEXA) 500 MG EXTENDED RELEASE TABLET    TAKE ONE TABLET BY MOUTH TWICE A DAY    ROSUVASTATIN (CRESTOR) 10 MG TABLET    Take 1 tablet by mouth daily    SPIRONOLACTONE (ALDACTONE) 25 MG TABLET    TAKE ONE AND ONE-HALF TABLETS BY MOUTH EVERY DAY    TORSEMIDE (DEMADEX) 20 MG TABLET    Take 1 tablet by mouth daily as needed (swelling)  TRIAMCINOLONE (KENALOG) 0.1 % CREAM    Apply topically 2 times daily.Apply with Q tip in each ear BID for 1 week.    TRIAMCINOLONE (KENALOG) 0.1 % CREAM    Apply to affected area twice daily for up to 2 weeks or until improved.    ZOSTER RECOMBINANT ADJUVANTED VACCINE (SHINGRIX) 50 MCG SUSR INJECTION    .5mL IM X 1 followed by .5mL IM 2-6 months after dose #1          ALLERGIES     Adhesive tape; Seasonal; and Talwin [pentazocine]    FAMILYHISTORY       Family History   Problem Relation Age of Onset   . Arthritis Mother    . Cancer Mother    . Depression Mother    . High Blood Pressure Mother    . Heart Disease Mother 48        MI    . Ovarian Cancer Mother 52   . Cancer Father 71        colon   . Heart Disease Maternal Grandmother    . Early Death Paternal Grandmother    . Heart Disease Paternal Grandfather    . Diabetes Other    . High Blood Pressure Other    . Obesity Other    . Other Sister         OSA   . Other Brother         OSA   . Other Brother         OSA   . Other Daughter         OSA          SOCIAL HISTORY       Social History     Tobacco Use   . Smoking status: Former Smoker     Packs/day: 0.50     Years: 40.00     Pack years: 20.00     Types: Cigarettes     Last attempt to quit: 08/07/2012     Years since quitting: 6.6   . Smokeless tobacco: Former Neurosurgeon     Quit date: 06/16/2013   . Tobacco comment: started to smoke at age 89 / only smoked 0.5 p.p.d    Substance Use Topics   . Alcohol use: No     Alcohol/week: 0.0 standard drinks   . Drug use: No       SCREENINGS             PHYSICAL EXAM    (up to 7 for level 4, 8 or more for level 5)     ED Triage Vitals [04/05/19 2017]   BP Temp Temp Source Pulse Resp SpO2 Height Weight   137/79 97.8 F (36.6 C) Oral 61 18 96 % 5\' 6"  (1.676 m) 300 lb (136.1 kg)       Physical Exam  Vitals signs and nursing note reviewed.   Constitutional:       Appearance: She is well-developed. She is not diaphoretic.   HENT:      Head: Atraumatic.      Nose: Nose normal.   Eyes:      General:         Right eye: No discharge.         Left eye: No discharge.   Neck:      Musculoskeletal: Normal range of motion.   Cardiovascular:      Rate and Rhythm: Normal rate and regular rhythm.  Pulses: Normal pulses.      Heart sounds: No murmur. No friction rub. No gallop.       Comments: 2+ dorsal pedal pulse bilaterally  Pulmonary:      Effort:  Pulmonary effort is normal. No respiratory distress.      Breath sounds: No stridor. No wheezing, rhonchi or rales.   Abdominal:      General: Bowel sounds are normal. There is no distension.      Palpations: Abdomen is soft. There is no mass.      Tenderness: There is no abdominal tenderness. There is no guarding or rebound.      Hernia: No hernia is present.   Musculoskeletal: Normal range of motion.         General: Tenderness present. No swelling.      Comments: Tenderness to palpate over the posterior aspect of the left knee.  Patient does have some superficial wounds of the proximal left calf from shaving today.  No erythema, fluctuance.  No tenderness over the anterior aspect the left knee or medial or lateral joint lines.  Full range of motion with flexion of left hip.  Full range of motion of left ankle.  Decreased range of motion flexion of the left knee.  No joint warmth, erythema or rash.  No pain with logrolling of the left leg.   Skin:     General: Skin is warm and dry.      Findings: No erythema or rash.   Neurological:      Mental Status: She is alert and oriented to person, place, and time.      Cranial Nerves: No cranial nerve deficit.   Psychiatric:         Behavior: Behavior normal.         DIAGNOSTIC RESULTS   LABS:    Labs Reviewed - No data to display    All other labs were within normal range or not returned as of this dictation.    EKG: All EKG's are interpreted by the Emergency Department Physician in the absence of a cardiologist.  Please see their note for interpretation of EKG.      RADIOLOGY:   Non-plain film images such as CT, Ultrasound and MRI are read by the radiologist. Plain radiographic images are visualized and preliminarily interpreted by the ED Provider with the below findings:        Interpretation per the Radiologist below, if available at the time of this note:    XR KNEE LEFT (MIN 4 VIEWS)   Final Result   No acute abnormality identified of the left knee.         VL  Extremity Venous Left    (Results Pending)     No results found.        PROCEDURES   Unless otherwise noted below, none     Procedures    CRITICAL CARE TIME   N/A    CONSULTS:  None      EMERGENCY DEPARTMENT COURSE and DIFFERENTIAL DIAGNOSIS/MDM:   Vitals:    Vitals:    04/05/19 2017   BP: 137/79   Pulse: 61   Resp: 18   Temp: 97.8 F (36.6 C)   TempSrc: Oral   SpO2: 96%   Weight: 300 lb (136.1 kg)   Height: 5\' 6"  (1.676 m)       Patient was given the following medications:  Medications   apixaban (ELIQUIS) tablet 10 mg (10 mg Oral Given 04/05/19 2248)  Followed by   apixaban (ELIQUIS) tablet 10 mg (has no administration in time range)       Presents to the emergency department with a chief complaint of left leg pain.  This is been ongoing for the past couple weeks.  Feels like a DVT she has had in the past.  She is tender to palpate behind her left knee.  She is distally neurovascularly intact.  X-ray imaging is nonacute.  Low suspicion for septic arthritis, arterial occlusion, compartment syndrome, acute bony abnormality, cellulitis or other emergent etiology.  Unable to get duplex ultrasound performed at night.  She will be started on Eliquis starter pack for possible DVT.  She has history of IVC filter.  Vitals are unremarkable and she denies any chest pain or shortness of breath.  She will follow-up and be written for a stat duplex ultrasound of her left leg as an outpatient.  Return here for any worsening of symptoms or problems at home.      FINAL IMPRESSION      1. Left leg pain    2. History of DVT (deep vein thrombosis)          DISPOSITION/PLAN   DISPOSITION Decision To Discharge 04/05/2019 10:57:55 PM      PATIENT REFERREDTO:  Adrienne Mocha, MD  [address]  [address]  [place] [address]  [phone]    Schedule an appointment as soon as possible for a visit in 3 days  For re-check    [facility] Emergency Department  [address]  [place] [address]  [phone]    As  needed      DISCHARGE MEDICATIONS:  New Prescriptions    HYDROCODONE-ACETAMINOPHEN (NORCO) 5-325 MG PER TABLET    Take 0.5-1 tablets by mouth every 6 hours as needed for Pain for up to 3 days.    LIDOCAINE (LIDODERM) 5 %    Place 1 patch onto the skin daily 12 hours on, 12 hours off.       DISCONTINUED MEDICATIONS:  Discontinued Medications    No medications on file              (Please note that portions of this note were completed with a voice recognition program.  Efforts were made to edit the dictations but occasionally words are mis-transcribed.)    Dalene Carrow, PA-C (electronically signed)           Dalene Carrow, PA-C  04/05/19 2259

## 2019-04-05 NOTE — Telephone Encounter (Signed)
Reason for Disposition  ??? [1] SEVERE pain (e.g., excruciating, unable to walk) AND [2] not improved after 2 hours of pain medicine    Answer Assessment - Initial Assessment Questions  1. LOCATION and RADIATION: "Where is the pain located?"       Left knee pain--started with pain behind knee; no all over--top of knee and medial and lateral sides of knee.  2. QUALITY: "What does the pain feel like?"  (e.g., sharp, dull, aching, burning)      Constant sharp pain. When standing--stabbing pain  3. SEVERITY: "How bad is the pain?" "What does it keep you from doing?"   (Scale 1-10; or mild, moderate, severe)    -  MILD (1-3): doesn't interfere with normal activities     -  MODERATE (4-7): interferes with normal activities (e.g., work or school) or awakens from sleep, limping     -  SEVERE (8-10): excruciating pain, unable to do any normal activities, unable to walk      9/10  4. ONSET: "When did the pain start?" "Does it come and go, or is it there all the time?"      Started at 7pm at work --standing  5. RECURRENT: "Have you had this pain before?" If so, ask: "When, and what happened then?"      Did have similar pain in the past r/t blood clot around 2000  6. SETTING: "Has there been any recent work, exercise or other activity that involved that part of the body?"       Was off work at [employer] for 1 month--more sitting. First day back to work last evening  7. AGGRAVATING FACTORS: "What makes the knee pain worse?" (e.g., walking, climbing stairs, running)      Standing and turning knee out to the side (like going to the bathroom)  8. ASSOCIATED SYMPTOMS: "Is there any swelling or redness of the knee?"      No redness; does not appear to be warm  9. OTHER SYMPTOMS: "Do you have any other symptoms?" (e.g., chest pain, difficulty breathing, fever, calf pain)      Swelling in left knee with radiation up to the hip  10. PREGNANCY: "Is there any chance you are pregnant?" "When was your last menstrual period?"         no    Protocols used: KNEE PAIN-ADULT-AH    Call received through Covid-19 hot line.  Key[place] calls with left knee pain since 7pm last night. She reports pain as above and states this is like the pain she had when she had a blood clot around 2000. She denies SOB. She has been off work for 1 month (volunteered with [employer]) and was fairly inactive during this month. She reports sitting a lot more than usual. As she could be at risk for DVT and she identifies her pain with her previous DVT, she needs to be seen, per dispo within 4 hours. After hours-she will go to ER to be evaluated.  Care instructions given per disposition via protocol.  Discussed using the [email or web address] virtual visit, coupon code: [email or web address].  Assisted patient in finding the nearest flu clinic.    Patient agrees to got o ER to be evaluated.  Please do not respond to the triage nurse through this encounter.  Any subsequent communication should be directly with the patient.

## 2019-04-05 NOTE — Telephone Encounter (Signed)
Patient Rachael Mays has [employer] now and [facility] needs an order for a new machine faxed over.

## 2019-04-06 ENCOUNTER — Inpatient Hospital Stay
Admit: 2019-04-06 | Discharge: 2019-04-06 | Disposition: A | Discharge status: Home / Self Care | Payer: PRIVATE HEALTH INSURANCE

## 2019-04-06 ENCOUNTER — Inpatient Hospital Stay: Admit: 2019-04-06 | Payer: PRIVATE HEALTH INSURANCE | Primary: Family

## 2019-04-06 DIAGNOSIS — M79605 Pain in left leg: Secondary | ICD-10-CM

## 2019-04-06 MED ORDER — HYDROCODONE-ACETAMINOPHEN 5-325 MG PO TABS
5-325 MG | ORAL_TABLET | Freq: Four times a day (QID) | ORAL | 0 refills | Status: AC | PRN
Start: 2019-04-06 — End: 2019-04-08

## 2019-04-06 MED ORDER — LIDOCAINE 5 % EX PTCH
5 % | MEDICATED_PATCH | Freq: Every day | CUTANEOUS | 0 refills | Status: AC
Start: 2019-04-06 — End: ?

## 2019-04-06 MED ORDER — APIXABAN 5 MG PO TABS
5 MG | Freq: Once | ORAL | Status: AC
Start: 2019-04-06 — End: 2019-04-05
  Administered 2019-04-06: 03:00:00 10 mg via ORAL

## 2019-04-06 MED ORDER — APIXABAN 5 MG PO TABS
5 MG | Freq: Two times a day (BID) | ORAL | Status: DC
Start: 2019-04-06 — End: 2019-04-06

## 2019-04-06 MED FILL — ELIQUIS 5 MG PO TABS: 5 MG | ORAL | Qty: 2

## 2019-04-06 NOTE — Telephone Encounter (Signed)
Order was sent to [facility]

## 2019-04-06 NOTE — Care Coordination-Inpatient (Signed)
ED Visit: Left Leg Pain    Hx: PE, DVT, CHF  Pt has pain behind her left knee. The pt stated she has taken two doses of the Eliquis since her ED visit. The pt stated she has called to schedule the doppler study for her left leg and is awaiting a call back with the appointment time.    Patient contacted regarding recent discharge and COVID-19 risk   Care Transition Nurse/ Ambulatory Care Manager contacted the patient by telephone to perform post discharge assessment. Verified name and DOB with patient as identifiers.     Patient has following risk factors of: heart failure and PE, DVT with IVC. CTN/ACM reviewed discharge instructions, medical action plan and red flags related to discharge diagnosis. Reviewed and educated them on any new and changed medications related to discharge diagnosis.  Advised obtaining a 90-day supply of all daily and as-needed medications.     Education provided regarding infection prevention, and signs and symptoms of COVID-19 and when to seek medical attention with patient who verbalized understanding. Discussed exposure protocols and quarantine from [facility] Guidelines ???Are you at higher risk for severe illness 2019??? and given an opportunity for questions and concerns. The patient agrees to contact the COVID-19 hotline [phone] or PCP office for questions related to their healthcare. CTN/ACM provided contact information for future reference.    From CDC: Are you at higher risk for severe illness?    ??? Wash your hands often.  ??? Avoid close contact (6 feet, which is about two arm lengths) with people who are sick.  ??? Put distance between yourself and other people if COVID-19 is spreading in your community.  ??? Clean and disinfect frequently touched surfaces.  ??? Avoid all cruise travel and non-essential air travel.  ??? Call your healthcare professional if you have concerns about COVID-19 and your underlying condition or if you are sick.    For more information on steps you can take to protect  yourself, see CDC's How to Protect Yourself    Pt agreed to Get Well LOOP-yes  Preferred email: [email or web address]  Preferred phone Number: [phone]      Pt will be further monitored by COVID Loop Team?? based on severity of symptoms and risk factors.

## 2019-04-06 NOTE — Progress Notes (Signed)
AISHWARYA SHIPLETT         DOB: 1956-08-24    Diagnosis: [x]  OSA (G47.33) []  CSA (G47.31) []  Apnea (G47.30)   Length of Need: [x]  5 Months []  99 Months []  Other:    Machine (DAW!): [x]  Respironics Dream Station      Auto []  ResMed AirSense     Auto []  Other:     []   CPAP [phone]) [x]  Bilevel (Q6578)   Mode: []  Auto []  Spontaneous    Mode: [x]  Auto []  Spontaneous            IPAP Max: 25 cmH2O  EPAP Min: 8 cmH2O  Pressure Support Min: 1  Pressure Support Max: 2        Comfort Settings:   - Ramp Pressure: 5 cmH2O                                        - Ramp time: 15 min                                     -  Flex/EPR - 3 full time                                    - For ResMed Bilevel (TiMax-4 sec   TiMin- 0.2 sec)     Humidifier: [x]  Heated (I6962)        [x]  Water chamber replacement (A7046)/ 1 per 6 months        Mask:   [x]  Nasal (X5284) /1 per 3 months []  Full Face (A7030) /1 per 3 months   [x]  Patient choice -Size and fit mask []  Patient Choice - Size and fit mask   []  Dispense:  []  Dispense:    [x]  Headgear (X3244) / 1 per 3 months []  Headgear (W1027) / 1 per 3 months   [x]  Replacement Nasal Cushion (A7032)/2 per month []  Interface Replacement (A7031)/1 per month   []  Replacement Nasal Pillows (A7033)/2 per month         Tubing: [x]  Heated (A4604)/1 per 3 months    []  Standard (A7037)/1 per 3 months []  Other:           Filters: [x]  Non-disposable (A7039)/1 per 6 months     [x]  Ultra-Fine, Disposable (A7038)/2 per month        Miscellaneous: []  Chin Strap (A7036)/ 1 per 6 months []  O2 bleed-in:       LPM   []  Oximetry on CPAP/Bilevel []   Other:    [x]  Modem: (O5366)         Start Order Date: 04/06/19    MEDICAL JUSTIFICATION:  I, the undersigned, certify that the above prescribed supplies are medically necessary for this patient???s wellbeing.  In my opinion, the supplies are both reasonable and necessary in reference to accepted standards of medicalpractice in treatment of this patient???s condition.    Lorriane Shire, APRN - CNP      NPI: [id number]       Order Signed Date: 04/06/19    Electronically signed by Lorriane Shire, APRN - CNP on 04/06/2019 at 9:48 AM

## 2019-04-07 MED ORDER — GABAPENTIN 300 MG PO CAPS
300 MG | ORAL_CAPSULE | Freq: Three times a day (TID) | ORAL | 0 refills | Status: DC
Start: 2019-04-07 — End: 2019-05-18

## 2019-04-07 NOTE — Telephone Encounter (Signed)
Called and notified pt. What is the next step? Still very swollen and can hardly walk on it. She is a Scientist, water quality at [employer]. Would like to speak to Dr. Awanda Mink

## 2019-04-07 NOTE — Telephone Encounter (Signed)
They did not find a DVT / blood clot on her ultrasound.  She can stop taking the blood thinner if she started taking it after the ED visit

## 2019-04-07 NOTE — Telephone Encounter (Signed)
Rachael Mays from [facility] called and pt needs a f-f and a new order with a new machine

## 2019-04-07 NOTE — Telephone Encounter (Signed)
Patient is calling to get results. Please advise

## 2019-04-07 NOTE — Telephone Encounter (Signed)
Called and discussed with her.     She is having aching / burning behind her left knee that radiates from her thigh. She has prior back issues and this may be radicular pain.  She has gabapentin but only takes at night.    Advised her to start taking TID and we will see if her symptoms improve over the next few days.  Call back next week.    May need referral to ortho / spine

## 2019-04-10 ENCOUNTER — Ambulatory Visit
Admit: 2019-04-10 | Discharge: 2019-04-10 | Payer: PRIVATE HEALTH INSURANCE | Attending: Critical Care Medicine | Primary: Family

## 2019-04-10 DIAGNOSIS — G4733 Obstructive sleep apnea (adult) (pediatric): Secondary | ICD-10-CM

## 2019-04-10 NOTE — Assessment & Plan Note (Signed)
Chronic- Stable.  Cont meds per PCP and other physicians.

## 2019-04-10 NOTE — Assessment & Plan Note (Signed)
Chronic-Stable.  Encouraged her to work on weight loss through diet and exercise.

## 2019-04-10 NOTE — Assessment & Plan Note (Signed)
Chronic- Stable.  Reviewed compliance download with pt.  Supplies and parts as needed for her machine.  These are medically necessary.  Continue medications per her PCP and other physicians. Limit caffeine use after 3pm.  Machine is over 61 years old and needs replaced.  3 month f/u.

## 2019-04-10 NOTE — Progress Notes (Signed)
Rachael Mays         DOB: Jul 07, 1956    Diagnosis: [x]  OSA (G47.33) []  CSA (G47.31) []  Apnea (G47.30)   Length of Need: [x]  13 Months []  99 Months []  Other:    Machine (DAW!): [x]  Respironics Dream Station      Auto []  ResMed AirSense     Auto []  Other:     []   CPAP [phone]) [x]  Bilevel (B3419)   Mode: []  Auto []  Spontaneous    Mode: [x]  Auto []  Spontaneous            IPAPmax-25  EPAPmin-7  PSmin-1  PSmax-5     Comfort Settings:   - Ramp Pressure: 4 cmH2O                                        - Ramp time: 15 min                                     -  Flex/EPR - 3 full time                                    - For ResMed Bilevel (TiMax-4 sec   TiMin- 0.2 sec)     Humidifier: [x]  Heated (F7902)        [x]  Water chamber replacement (A7046)/ 1 per 6 months        Mask:   []  Nasal (I0973) /1 per 3 months [x]  Full Face (A7030) /1 per 3 months   []  Patient choice -Size and fit mask [x]  Patient Choice - Size and fit mask   []  Dispense:  []  Dispense:    []  Headgear (Z3299) / 1 per 3 months [x]  Headgear (M4268) / 1 per 3 months   []  Replacement Nasal Cushion (A7032)/2 per month [x]  Interface Replacement (A7031)/1 per month   []  Replacement Nasal Pillows (A7033)/2 per month         Tubing: []  Heated (A4604)/1 per 3 months    [x]  Standard (A7037)/1 per 3 months []  Other:           Filters: [x]  Non-disposable (A7039)/1 per 6 months     [x]  Ultra-Fine, Disposable (A7038)/2 per month        Miscellaneous: []  Chin Strap (A7036)/ 1 per 6 months []  O2 bleed-in:       LPM   []  Oximetry on CPAP/Bilevel []   Other:    [x]  Modem: (T4196)         Start Order Date: 04/10/19    MEDICAL JUSTIFICATION:  I, the undersigned, certify that the above prescribed supplies are medically necessary for this patient???s wellbeing.  In my opinion, the supplies are both reasonable and necessary in reference to accepted standards of medicalpractice in treatment of this patient???s condition.    Tobin Chad, MD      NPI: [id number]       Order Signed Date:  04/10/19    Electronically signed by Tobin Chad, MD on 04/10/2019 at 10:29 AM

## 2019-04-10 NOTE — Progress Notes (Signed)
Magdalene River MD, FAASM, [facility]  Tiffanie Kehrt CNP [place]  [address]  [address], [place]  [address]  P- [phone]   [facility] [address] [place] [address]  F- [phone]     Video Visit- Follow up    Pursuant to the emergency declaration under the [place], 1135 waiver authority and the [employer] Act, this Virtual  Visit was conducted, with patient's consent, to reduce the patient's risk of exposure to COVID-19 and provide continuity of care for an established patient.    Services were provided through a video synchronous discussion virtually to substitute for in-person clinic visit.  Patient was located in their home.    Assessment/Plan:     Obstructive sleep apnea  Chronic- Stable.  Reviewed compliance download with pt.  Supplies and parts as needed for her machine.  These are medically necessary.  Continue medications per her PCP and other physicians. Limit caffeine use after 3pm.  Machine is over 52 years old and needs replaced.  3 month f/u.    Coronary artery disease due to lipid rich plaque  Chronic- Stable.  Cont meds per PCP and other physicians.    Essential hypertension  Chronic- Stable.  Cont meds per PCP and other physicians.    Morbid obesity with body mass index (BMI) of 45.0 to 49.9 in adult (HCC)  Chronic-Stable.  Encouraged her to work on weight loss through diet and exercise.      Diagnoses of Obstructive sleep apnea, Coronary artery disease due to lipid rich plaque, Essential hypertension, and Morbid obesity with body mass index (BMI) of 45.0 to 49.9 in adult [facility]) were pertinent to this visit.  The chronic medical conditions listed are directly related to the primary diagnosis listed above.  The management of the primary diagnosis affects the secondary diagnosis and vice versa.      Subjective:     Patient ID: Rachael Mays is a 63 y.o. female.    No chief  complaint on file.    Subjective   HPI:    No modem available on machine.    OSA: Patient is doing well with machine until a few weeks ago when he started making more noise.  Machine is over 89 years old and is loud enough that is keeping him awake at nighttime.  Prior to the machine breaking she was sleeping well, pressure felt good, and she really had no issues using her machine.    [place]    [place] - Total score: 7    Social History     Socioeconomic History   ??? Marital status: Divorced     Spouse name: [name]   ??? Number of children: 2   ??? Years of education: 41   ??? Highest education level: Not on file   Occupational History   ??? Not on file   Social Needs   ??? Financial resource strain: Not on file   ??? Food insecurity     Worry: Not on file     Inability: Not on file   ??? Transportation needs     Medical: Not on file     Non-medical: Not on file   Tobacco Use   ??? Smoking status: Former Smoker     Packs/day: 0.50     Years: 40.00     Pack years: 20.00  Types: Cigarettes     Last attempt to quit: 08/07/2012     Years since quitting: 6.6   ??? Smokeless tobacco: Former Systems developer     Quit date: 06/16/2013   ??? Tobacco comment: started to smoke at age 38 / only smoked 0.5 p.p.d    Substance and Sexual Activity   ??? Alcohol use: No     Alcohol/week: 0.0 standard drinks   ??? Drug use: No   ??? Sexual activity: Never   Lifestyle   ??? Physical activity     Days per week: Not on file     Minutes per session: Not on file   ??? Stress: Not on file   Relationships   ??? Social Product manager on phone: Not on file     Gets together: Not on file     Attends religious service: Not on file     Active member of club or organization: Not on file     Attends meetings of clubs or organizations: Not on file     Relationship status: Not on file   ??? Intimate partner violence     Fear of current or ex partner: Not on file     Emotionally abused: Not on file     Physically abused: Not on file     Forced sexual activity: Not on file    Other Topics Concern   ??? Not on file   Social History Narrative    Works as Scientist, water quality at [employer]            Current Outpatient Medications   Medication Sig Dispense Refill   ??? gabapentin (NEURONTIN) 300 MG capsule Take 1 capsule by mouth 3 times daily for 30 days. 90 capsule 0   ??? lidocaine (LIDODERM) 5 % Place 1 patch onto the skin daily 12 hours on, 12 hours off. 30 patch 0   ??? nitroGLYCERIN (NITROSTAT) 0.4 MG SL tablet up to max of 3 total doses. If no relief after 1 dose, call 911. 25 tablet 3   ??? rosuvastatin (CRESTOR) 10 MG tablet Take 1 tablet by mouth daily 30 tablet 2   ??? ranolazine (RANEXA) 500 MG extended release tablet TAKE ONE TABLET BY MOUTH TWICE A DAY 180 tablet 2   ??? prasugrel (EFFIENT) 10 MG TABS Take 1 tablet by mouth daily 90 tablet 3   ??? torsemide (DEMADEX) 20 MG tablet Take 1 tablet by mouth daily as needed (swelling) 30 tablet 1   ??? spironolactone (ALDACTONE) 25 MG tablet TAKE ONE AND ONE-HALF TABLETS BY MOUTH EVERY DAY (Patient taking differently: Take 25 mg by mouth daily TAKE ONE AND ONE-HALF TABLETS BY MOUTH EVERY DAY/ patient taking one tablet daily) 135 tablet 1   ??? metoprolol (LOPRESSOR) 100 MG tablet TAKE 1 AND 1/2 TABLET BY MOUTH TWO TIMES A DAY 270 tablet 1   ??? omeprazole (PRILOSEC) 40 MG delayed release capsule TAKE 1 CAPSULE BY MOUTH ONE TIME A DAY 90 capsule 1   ??? lisinopril (PRINIVIL;ZESTRIL) 40 MG tablet TAKE 1 TABLET BY MOUTH ONE TIME A DAY 90 tablet 1   ??? montelukast (SINGULAIR) 10 MG tablet TAKE 1 TABLET BY MOUTH ONE TIME A DAY 90 tablet 1   ??? diclofenac sodium 1 % GEL Apply 2 g topically 2 times daily 1 Tube 3   ??? triamcinolone (KENALOG) 0.1 % cream Apply to affected area twice daily for up to 2 weeks or until improved. 45 g 2   ???  aspirin 81 MG EC tablet Take 1 tablet by mouth daily 30 tablet 11   ??? triamcinolone (KENALOG) 0.1 % cream Apply topically 2 times daily.Apply with Q tip in each ear BID for 1 week. 15 g 1   ??? mupirocin (BACTROBAN) 2 % ointment Apply topically   Daily.Apply with Q tip in each naris at night 15 g 0   ??? albuterol sulfate HFA (PROVENTIL HFA) 108 (90 BASE) MCG/ACT inhaler Inhale 2 puffs into the lungs every 6 hours as needed for Wheezing 3 Inhaler 3   ??? Multiple Vitamins-Minerals (MULTIVITAMIN PO) Take 1 tablet by mouth daily      ??? CALCIUM-VITAMIN D PO Take 1 tablet by mouth daily      ??? cetirizine (ZYRTEC ALLERGY) 10 MG tablet Take 10 mg by mouth daily.     ??? zoster recombinant adjuvanted vaccine (SHINGRIX) 50 MCG SUSR injection .80mL IM X 1 followed by .72mL IM 2-6 months after dose #1 (Patient not taking: Reported on 03/22/2019) 0.5 mL 0     No current facility-administered medications for this visit.        Allergies as of 04/10/2019 - Review Complete 04/05/2019   Allergen Reaction Noted   ??? Adhesive tape  09/30/2017   ??? Seasonal  08/28/2016   ??? Talwin [pentazocine] Other (See Comments) 03/29/2013       Patient Active Problem List   Diagnosis   ??? Back pain   ??? Depression   ??? Pulmonary embolism ([place])   ??? Abdominal  pain, other specified site   ??? Morbid obesity with body mass index (BMI) of 45.0 to 49.9 in adult [facility])   ??? Status post gastric banding   ??? Tinnitus, subjective   ??? Other complications of gastric band procedure   ??? Essential hypertension   ??? Hyperglycemia   ??? Atypical chest pain   ??? 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 Adrenal mass, left ([place])   ??? Adrenal nodule ([place])   ??? Obstructive sleep apnea   ??? Abnormal stress test   ??? Chronic diastolic congestive heart failure ([place])   ??? Coronary artery disease due to lipid rich plaque   ??? Pulmonary hypertension   ??? 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 Chest pain   ??? Anxiety and depression   ??? Bradycardia   ??? Dyslipidemia   ??? Chronic eczematous otitis externa of both ears   ??? Mucositis   ??? Sprain of radiocarpal joint of right wrist   ??? Idiopathic peripheral neuropathy   ??? Chest pain in adult   ??? Hypoxia   ??? Chronic pain syndrome   ??? Unstable angina (HCC)   ???  Degeneration of thoracic intervertebral disc   ??? 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 Disc displacement, thoracic       Past Medical History:   Diagnosis Date   ??? Anxiety    ??? Asthma    ??? Blood circulation, collateral    ??? CAD (coronary artery disease)    ??? CHF (congestive heart failure) ([place])    ??? Chronic back pain    ??? Deep vein thrombosis    ??? 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 Depression    ??? GERD (gastroesophageal reflux disease)     NO LONGER SINCE LAP   ??? GERD (gastroesophageal reflux disease) 01/23/2009   ??? Hx of blood clots    ???  Hyperlipidemia     hx; resolved with lap band   ??? Hypertension    ??? Idiopathic peripheral neuropathy 03/26/2017   ??? Melanoma ([place])     upper back   ??? Obesity     hx of; had lap band   ??? Obstructive sleep apnea 09/18/2015     Updating Deprecated Diagnoses   ??? Obstructive sleep apnea (adult) (pediatric) 09/18/2015   ??? Pulmonary embolism [facility])              Electronically signed by Tobin Chad, MD on 04/10/2019 at 10:49 AM

## 2019-04-20 ENCOUNTER — Telehealth

## 2019-04-20 NOTE — Telephone Encounter (Signed)
Pt is calling because she needs a referral to a Podiatrist. She wants Dr. Awanda Mink to recommend someone. Please advise

## 2019-04-20 NOTE — Telephone Encounter (Signed)
Pls advise

## 2019-04-21 NOTE — Telephone Encounter (Signed)
She can see Jessy Oto, DPM    Two locations:  [facility]  [place] [address]  New Patients: [phone]  Fax: [phone]      [facility], [place] [address]  New Patients: [phone]  Fax: [phone]

## 2019-04-21 NOTE — Telephone Encounter (Signed)
LMTCB

## 2019-05-11 ENCOUNTER — Encounter

## 2019-05-11 NOTE — Telephone Encounter (Signed)
Called patient patient stated that this boil in her ear busted on Tuesday and it is now causing a burning all the way to her neck.  Patient describes pain as a 7 out of 10 and it is constant.  Patient stated that she had some ear drops from a previous ENT appointment a few years ago that she used and it burned badly.  Please advise

## 2019-05-11 NOTE — Telephone Encounter (Signed)
Called and spoke with patient she will try to get into ENT I placed referral and told her to call if she needed anything

## 2019-05-11 NOTE — Telephone Encounter (Signed)
Patient called today with ear pain, patient had a boil bust in her ear and is now draining.    Was offered a virtual appointment however triage nurse recommends patient is seen in office asap.   Patient would like Stephan Minister, MD to see her in person, please call patient to schedule office visit.

## 2019-05-11 NOTE — Telephone Encounter (Signed)
Reason for Disposition  ??? White, yellow, or green discharge    Answer Assessment - Initial Assessment Questions  1. LOCATION: "Which ear is involved?"      Left some pain in right starting  2. ONSET: "When did the ear start hurting"       Two days   3. SEVERITY: "How bad is the pain?"  (Scale 1-10; mild, moderate or severe)    - MILD (1-3): doesn't interfere with normal activities     - MODERATE (4-7): interferes with normal activities or awakens from sleep     - SEVERE (8-10): excruciating pain, unable to do any normal activities       7/10  4. URI SYMPTOMS: " Do you have a runny nose or cough?"      congestion  5. FEVER: "Do you have a fever?" If so, ask: "What is your temperature, how was it measured, and when did it start?"      no  6. CAUSE: "Have you been swimming recently?", "How often do you use Q-TIPS?", "Have you had any recent air travel or scuba diving?"      Boil in ear busted two days ago also has eczema which causes this   7. OTHER SYMPTOMS: "Do you have any other symptoms?" (e.g., headache, stiff neck, dizziness, vomiting, runny nose, decreased hearing)      Decreased hearing  8. PREGNANCY: "Is there any chance you are pregnant?" "When was your last menstrual period?"      no    Protocols used: EARACHE-ADULT-[place]    Discharge is yellow and blood tinged  Wants to wait until tomorrow

## 2019-05-11 NOTE — Telephone Encounter (Signed)
Unfortunately I can't see her in the office until Monday but she needs to be seen before then if she has an infection.  Virtual visit will not be adequate because someone needs to examine her ear  Her options would be to schedule appointment with urgent care, a green clinic that is already open, or her ENT

## 2019-05-15 ENCOUNTER — Ambulatory Visit
Admit: 2019-05-15 | Discharge: 2019-05-15 | Payer: PRIVATE HEALTH INSURANCE | Attending: Otolaryngology | Primary: Family

## 2019-05-15 DIAGNOSIS — H6000 Abscess of external ear, unspecified ear: Secondary | ICD-10-CM

## 2019-05-15 MED ORDER — NEOMYCIN-POLYMYXIN-HC 3.5-10000-1 OT SOLN
Freq: Two times a day (BID) | OTIC | 0 refills | Status: AC
Start: 2019-05-15 — End: 2019-05-25

## 2019-05-15 MED ORDER — CLINDAMYCIN HCL 300 MG PO CAPS
300 MG | ORAL_CAPSULE | Freq: Three times a day (TID) | ORAL | 0 refills | Status: AC
Start: 2019-05-15 — End: 2019-05-22

## 2019-05-15 NOTE — Progress Notes (Signed)
[place]      Patient Name: Rachael Mays  Medical Record Number:  [id number]  Primary Care Physician:  Stephan Minister, MD  Date of Consultation: 05/15/2019    Chief Complaint: Bilateral ear pain        HISTORY OF PRESENT ILLNESS  Rachael Mays is a(n) 63 y.o. female who presents for evaluation of bilateral ear discomfort.  Patient says she has a long history of getting boils in her ear canal.  These will typically swell up and then popped.  It is quite uncomfortable at times.  She said that most recently she had her left ear draining, but over the last few days the right ear has started to get uncomfortable.  She does not think that her hearing is affected.  She does not have any other history of ear problems.  She is never had ear surgery.  She is not currently being treated for the ear problem.      Patient Active Problem List   Diagnosis   ??? Back pain   ??? Depression   ??? Pulmonary embolism ([place])   ??? Abdominal  pain, other specified site   ??? Morbid obesity with body mass index (BMI) of 45.0 to 49.9 in adult [facility])   ??? Status post gastric banding   ??? Tinnitus, subjective   ??? Other complications of gastric band procedure   ??? Essential hypertension   ??? Hyperglycemia   ??? Atypical chest pain   ??? 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 Adrenal mass, left ([place])   ??? Adrenal nodule ([place])   ??? Obstructive sleep apnea   ??? Abnormal stress test   ??? Chronic diastolic congestive heart failure ([place])   ??? Coronary artery disease due to lipid rich plaque   ??? Pulmonary hypertension   ??? 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 Chest pain   ??? Anxiety and depression   ??? Bradycardia   ??? Dyslipidemia   ??? Chronic eczematous otitis externa of both ears   ??? Mucositis   ??? Sprain of radiocarpal joint of right wrist   ??? Idiopathic peripheral neuropathy   ??? Chest pain in adult   ??? Hypoxia   ??? Chronic pain syndrome   ??? Unstable angina (HCC)   ???  Degeneration of thoracic intervertebral disc   ??? 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 Disc displacement, thoracic     Past Surgical History:   Procedure Laterality Date   ??? BACK SURGERY      neck  plates   ??? BREAST SURGERY      left lumpectomy   ??? CESAREAN SECTION     ??? CHOLECYSTECTOMY     ??? COLONOSCOPY     ??? COLONOSCOPY  04/08/10   ??? CORONARY ANGIOPLASTY     ??? DIAGNOSTIC CARDIAC CATH LAB PROCEDURE     ??? DILATATION, ESOPHAGUS     ??? ENDOSCOPY, COLON, DIAGNOSTIC     ??? HYSTERECTOMY, VAGINAL     ??? LAP BAND  05/01/08    Dr. Nicole Cella   ??? OTHER SURGICAL HISTORY      [place] Filter: curently in place   ??? OTHER SURGICAL HISTORY  07/05/13    lap band removal   ??? SHOULDER SURGERY     ??? SKIN CANCER EXCISION  12/29/2017   ??? UPPER GASTROINTESTINAL ENDOSCOPY  05/19/13   ??? UPPER GASTROINTESTINAL ENDOSCOPY  07/21/13    ESOPHAGEAL STENT PLACEMENT   ??? UPPER GASTROINTESTINAL ENDOSCOPY N/A 07/31/2014    Esophagogastroduodenoscopy with esophageal balloon dilation   ??? VASCULAR SURGERY  04/2015    Lucendia Herrlich, celiac artery angiogram, normal abd arteries     Family History   Problem Relation Age of Onset   ??? Arthritis Mother    ??? Cancer Mother    ??? Depression Mother    ??? High Blood Pressure Mother    ??? Heart Disease Mother 50        MI    ??? Ovarian Cancer Mother 5   ??? Cancer Father 51        colon   ??? Heart Disease Maternal Grandmother    ??? Early Death Paternal Grandmother    ??? Heart Disease Paternal Grandfather    ??? Diabetes Other    ??? High Blood Pressure Other    ??? Obesity Other    ??? Other Sister         OSA   ??? Other Brother         OSA   ??? Other Brother         OSA   ??? Other Daughter         OSA     Social History     Socioeconomic History   ??? Marital status: Divorced     Spouse name: [name]   ??? Number of children: 2   ??? Years of education: 48   ??? Highest education level: Not on file   Occupational History   ??? Not on file   Social Needs   ??? Financial resource strain: Not on  file   ??? Food insecurity     Worry: Not on file     Inability: Not on file   ??? Transportation needs     Medical: Not on file     Non-medical: Not on file   Tobacco Use   ??? Smoking status: Former Smoker     Packs/day: 0.50     Years: 40.00     Pack years: 20.00     Types: Cigarettes     Last attempt to quit: 08/07/2012     Years since quitting: 6.7   ??? Smokeless tobacco: Former Systems developer     Quit date: 06/16/2013   ??? Tobacco comment: started to smoke at age 89 / only smoked 0.5 p.p.d    Substance and Sexual Activity   ??? Alcohol use: No     Alcohol/week: 0.0 standard drinks   ??? Drug use: No   ??? Sexual activity: Never   Lifestyle   ??? Physical activity     Days per week: Not on file     Minutes per session: Not on file   ??? Stress: Not on file   Relationships   ??? Social Product manager on phone: Not on file     Gets together: Not on file     Attends religious service: Not on file     Active member of club or organization: Not on file     Attends meetings of clubs or organizations: Not on file     Relationship status: Not on file   ??? Intimate partner violence     Fear of current or ex partner: Not on file     Emotionally abused: Not on file     Physically abused: Not on file  Forced sexual activity: Not on file   Other Topics Concern   ??? 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 Authorizing Provider   neomycin-polymyxin-hydrocortisone (CORTISPORIN) 3.5-10000-1 otic solution Place 4 drops into both ears 2 times daily for 10 days Instill into both Ears 05/15/19 05/25/19 Yes Lujean Amel, MD   clindamycin (CLEOCIN) 300 MG capsule Take 1 capsule by mouth 3 times daily for 7 days 05/15/19 05/22/19 Yes Lujean Amel, MD   gabapentin (NEURONTIN) 300 MG capsule Take 1 capsule by mouth 3 times daily for 30 days.  04/07/19 05/07/19  Stephan Minister, MD   lidocaine (LIDODERM) 5 % Place 1 patch onto the skin daily 12 hours on, 12 hours off. 04/05/19   Sharmaine Base Wanninger, PA-C   nitroGLYCERIN (NITROSTAT) 0.4 MG SL tablet up to max of 3 total doses. If no relief after 1 dose, call 911. 03/31/19   Luiz Iron, APRN - CNP   rosuvastatin (CRESTOR) 10 MG tablet Take 1 tablet by mouth daily 03/24/19   Luiz Iron, APRN - CNP   ranolazine (RANEXA) 500 MG extended release tablet TAKE ONE TABLET BY MOUTH TWICE A DAY 03/22/19   Luiz Iron, APRN - CNP   prasugrel (EFFIENT) 10 MG TABS Take 1 tablet by mouth daily 03/22/19   Luiz Iron, APRN - CNP   torsemide (DEMADEX) 20 MG tablet Take 1 tablet by mouth daily as needed (swelling) 03/22/19   Luiz Iron, APRN - CNP   spironolactone (ALDACTONE) 25 MG tablet TAKE ONE AND ONE-HALF TABLETS BY MOUTH EVERY DAY  Patient taking differently: Take 25 mg by mouth daily TAKE ONE AND ONE-HALF TABLETS BY MOUTH EVERY DAY/ patient taking one tablet daily 02/24/19   Stephan Minister, MD   metoprolol (LOPRESSOR) 100 MG tablet TAKE 1 AND 1/2 TABLET BY MOUTH TWO TIMES A DAY 02/24/19   Stephan Minister, MD   omeprazole (PRILOSEC) 40 MG delayed release capsule TAKE 1 CAPSULE BY MOUTH ONE TIME A DAY 02/24/19   Stephan Minister, MD   lisinopril (PRINIVIL;ZESTRIL) 40 MG tablet TAKE 1 TABLET BY MOUTH ONE TIME A DAY 02/24/19   Stephan Minister, MD   montelukast (SINGULAIR) 10 MG tablet TAKE 1 TABLET BY MOUTH ONE TIME A DAY 02/24/19   Stephan Minister, MD   diclofenac sodium 1 % GEL Apply 2 g topically 2 times daily 09/16/18   Stephan Minister, MD   triamcinolone (KENALOG) 0.1 % cream Apply to affected area twice daily for up to 2 weeks or until improved. 07/04/18   Francisca December, MD   zoster recombinant adjuvanted vaccine [facility]) 50 MCG SUSR injection .2mL IM X 1 followed by .39mL IM 2-6 months after dose #1  Patient not taking: Reported on 03/22/2019 08/26/17   Harvel Quale, MD   aspirin 81  MG EC tablet Take 1 tablet by mouth daily 02/16/17   Harvel Quale, MD   triamcinolone (KENALOG) 0.1 % cream Apply topically 2 times daily.Apply with Q tip in each ear BID for 1 week. 01/04/17   Alvina Chou, MD   mupirocin (BACTROBAN) 2 % ointment Apply topically  Daily.Apply with Q tip in each naris at night 01/04/17   Alvina Chou,  MD   albuterol sulfate HFA (PROVENTIL HFA) 108 (90 BASE) MCG/ACT inhaler Inhale 2 puffs into the lungs every 6 hours as needed for Wheezing 12/27/15   Harvel Quale, MD   Multiple Vitamins-Minerals (MULTIVITAMIN PO) Take 1 tablet by mouth daily     Historical Provider, MD   CALCIUM-VITAMIN D PO Take 1 tablet by mouth daily     Historical Provider, MD   cetirizine (ZYRTEC ALLERGY) 10 MG tablet Take 10 mg by mouth daily.    Historical Provider, MD       REVIEW OF SYSTEMS  The following systems were reviewed and revealed the following in addition to any already discussed in the HPI:    CONSTITUTIONAL: no weight loss, no fever, no night sweats, no chills  EYES: no vision changes, no blurry vision  EARS: Ear discomfort  NOSE: no epistaxis, no rhinorrhea  RESPIRATORY: no  Difficulty breathing, no shortness of breath  CV: no chest pain, no Peripheral vascular disease  HEME: No coagulation disorder, no Bleeding disorder  NEURO: no TIA or stroke-like symptoms  SKIN: No new rashes in the head and neck, no recent skin cancers  MOUTH: No new ulcers, no recent teeth infections  GASTROINTESTINAL: No diarrhea, stomach pain  PSYCH: No anxiety, no depression      PHYSICAL EXAM  BP 131/64 (Site: Right Lower Arm, Position: Sitting, Cuff Size: Medium Adult)    Pulse 58    Temp 98 ??F (36.7 ??C) (Oral)    Ht 5\' 6"  (1.676 m)    Wt (!) 307 lb (139.3 kg)    BMI 49.55 kg/m??     GENERAL: No Acute Distress, Alert and Oriented, no Hoarseness, strong voice  EYES: EOMI, Anti-icteric  HENT:   Head: Normocephalic and atraumatic.   Face:  Symmetric, facial nerve intact, no sinus tenderness    ears: See below  Mouth/Oral Cavity:  normal lips, Uvula is midline, no mucosal lesions, no trismus, normal dentition, normal salivary quality/flow  Oropharynx/Larynx:  normal oropharynx, normal tonsils; normal larynx/nasopharynx on mirror exam  Nose:Normal external nasal appearance.  Anterior rhinoscopy shows a normal septum.  Normal turbinates.  Normal mucosa   NECK: Normal range of motion, no thyromegaly, trachea is midline, no lymphadenopathy, no neck masses, no crepitus  CHEST: Normal respiratory effort, no retractions, breathing comfortably  SKIN: No rashes, normal appearing skin, no evidence of skin lesions/tumors  Neuro:  cranial nerve II-XII intact; normal gait  Cardio:  no edema    PROCEDURE  Bilateral ear exam  The right ear was visualized with binocular scope.  The patient had what appeared to be a furuncle of the lateral canal.  I applied some topical lidocaine.  I then unroofed the furuncle.  I then evacuated some purulent drainage from the canal itself. the tympanic membrane appeared to be intact with an aerated middle ear.    On the left side there was a little bit of purulent drainage the evacuated [place] suction.  I did not appreciate a furuncle.  Tympanic membrane intact with an aerated middle ear            ASSESSMENT/PLAN  1. Furuncle of ear  Sounds that this patient gets recurrent follicles of the ear canal.  I would like to treat her with Floxin drops as well as clindamycin oral antibiotics get this cleared up.  Urine a couple weeks to make sure it looks better.  2. Other infective acute otitis externa of both ears  As above  I have performed a head and neck physical exam personally or was physically present during the key or critical portions of the service.    Medical Decision Making:  The following items were considered in medical decision making:  Independent review of images  Review / order clinical lab tests  Review / order radiology tests  Decision to obtain old records

## 2019-05-18 MED ORDER — GABAPENTIN 300 MG PO CAPS
300 MG | ORAL_CAPSULE | ORAL | 0 refills | Status: DC
Start: 2019-05-18 — End: 2019-07-13

## 2019-05-18 NOTE — Telephone Encounter (Signed)
Last Appt: 10.11.19  Next Appt: Not Found  Last Refill: 5.1.20    Please advise

## 2019-05-25 ENCOUNTER — Inpatient Hospital Stay: Admit: 2019-05-25 | Payer: PRIVATE HEALTH INSURANCE | Primary: Family

## 2019-05-25 DIAGNOSIS — R202 Paresthesia of skin: Secondary | ICD-10-CM

## 2019-05-25 NOTE — Procedures (Signed)
[facility]                     [place] [address]                             ELECTROMYOGRAM REPORT    PATIENT NAME: Rachael Mays, Rachael Mays                     DOB:        10/27/1956  MED REC NO:   [id number]                          ROOM:  ACCOUNT NO:   [id number]                           ADMIT DATE: 05/25/2019  PROVIDER:     Richardo Hanks, MD    DATE OF EMG:  05/25/2019    REASON FOR EMG:  Bilateral leg numbness, rule out neuropathy.    SUMMARY:  Bilateral peroneal and posterior tibial motor nerve studies  were normal.  Bilateral sural sensory nerve studies were normal.  Needle  EMG of several muscles in both lower extremities showed evidence of  denervation in the left medial gastrocnemius muscle and left lumbar  paraspinal muscles.  The right side was normal.    EMG DIAGNOSIS:  This patient has evidence of a mild left S1 nerve root  lesion.  The right side is within normal limits.  There is no  electrophysiological evidence for large fiber peripheral neuropathy in  this study.        Richardo Hanks, MD    D: 05/25/2019 13:47:02       T: 05/25/2019 13:55:13     [id number]  Job#: [id number]     Doc#: [id number]    CC:

## 2019-05-29 ENCOUNTER — Ambulatory Visit
Admit: 2019-05-29 | Discharge: 2019-05-29 | Payer: PRIVATE HEALTH INSURANCE | Attending: Otolaryngology | Primary: Family

## 2019-05-29 DIAGNOSIS — H6000 Abscess of external ear, unspecified ear: Secondary | ICD-10-CM

## 2019-05-29 NOTE — Progress Notes (Signed)
[place]      Patient Name: Rachael Mays  Medical Record Number:  [id number]  Primary Care Physician:  Stephan Minister, MD  Date of Consultation: 05/29/2019          BRIEF HISTORY OF PRESENT ILLNESS  Chiquitta is a(n) 63 y.o. female who presents for follow-up of a furuncle of her ears.  When I saw her on June 8 she had a furuncle of the right ear that I ruptured.  She is been on antibiotics by mouth and eardrops.  She said this made a significant difference in the discomfort.  She reportedly gets recurrent furuncles of the ear canals and has had this since she was a child.    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  Says she is not had a hearing test in about 5 years.          Patient Active Problem List   Diagnosis   ??? Back pain   ??? Depression   ??? Pulmonary embolism ([place])   ??? Abdominal  pain, other specified site   ??? Morbid obesity with body mass index (BMI) of 45.0 to 49.9 in adult [facility])   ??? Status post gastric banding   ??? Tinnitus, subjective   ??? Other complications of gastric band procedure   ??? Essential hypertension   ??? Hyperglycemia   ??? Atypical chest pain   ??? 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 Adrenal mass, left ([place])   ??? Adrenal nodule ([place])   ??? Obstructive sleep apnea   ??? Abnormal stress test   ??? Chronic diastolic congestive heart failure ([place])   ??? Coronary artery disease due to lipid rich plaque   ??? Pulmonary hypertension   ??? 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 Chest pain   ??? Anxiety and depression   ??? Bradycardia   ??? Dyslipidemia   ??? Chronic eczematous otitis externa of both ears   ??? Mucositis   ??? Sprain of radiocarpal joint of right wrist   ??? Idiopathic peripheral neuropathy   ??? Chest pain in adult   ??? Hypoxia   ??? Chronic pain syndrome   ??? Unstable angina (HCC)   ??? Degeneration of thoracic intervertebral disc   ???  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 Superficial spreading melanoma ([place])   ??? Disc displacement, thoracic   ??? Bilateral leg numbness     Past Surgical History:   Procedure Laterality Date   ??? BACK SURGERY      neck  plates   ??? BREAST SURGERY      left lumpectomy   ??? CESAREAN SECTION     ??? CHOLECYSTECTOMY     ??? COLONOSCOPY     ??? COLONOSCOPY  04/08/10   ??? CORONARY ANGIOPLASTY     ??? DIAGNOSTIC CARDIAC CATH LAB PROCEDURE     ??? DILATATION, ESOPHAGUS     ??? ENDOSCOPY, COLON, DIAGNOSTIC     ??? HYSTERECTOMY, VAGINAL     ??? LAP BAND  05/01/08    Dr. Nicole Cella   ??? OTHER SURGICAL HISTORY      [place] Filter: curently in place   ??? OTHER SURGICAL HISTORY  07/05/13    lap band removal   ??? SHOULDER SURGERY     ??? SKIN CANCER EXCISION  12/29/2017   ???  UPPER GASTROINTESTINAL ENDOSCOPY  05/19/13   ??? UPPER GASTROINTESTINAL ENDOSCOPY  07/21/13    ESOPHAGEAL STENT PLACEMENT   ??? UPPER GASTROINTESTINAL ENDOSCOPY N/A 07/31/2014    Esophagogastroduodenoscopy with esophageal balloon dilation   ??? VASCULAR SURGERY  04/2015    Lucendia Herrlich, celiac artery angiogram, normal abd arteries     Family History   Problem Relation Age of Onset   ??? Arthritis Mother    ??? Cancer Mother    ??? Depression Mother    ??? High Blood Pressure Mother    ??? Heart Disease Mother 70        MI    ??? Ovarian Cancer Mother 91   ??? Cancer Father 70        colon   ??? Heart Disease Maternal Grandmother    ??? Early Death Paternal Grandmother    ??? Heart Disease Paternal Grandfather    ??? Diabetes Other    ??? High Blood Pressure Other    ??? Obesity Other    ??? Other Sister         OSA   ??? Other Brother         OSA   ??? Other Brother         OSA   ??? Other Daughter         OSA     Social History     Socioeconomic History   ??? Marital status: Divorced     Spouse name: [name]   ??? Number of children: 2   ??? Years of education: 72   ??? Highest education level: Not on file   Occupational History   ??? Not on file   Social Needs   ??? Financial resource strain: Not on file   ??? Food  insecurity     Worry: Not on file     Inability: Not on file   ??? Transportation needs     Medical: Not on file     Non-medical: Not on file   Tobacco Use   ??? Smoking status: Former Smoker     Packs/day: 0.50     Years: 40.00     Pack years: 20.00     Types: Cigarettes     Last attempt to quit: 08/07/2012     Years since quitting: 6.8   ??? Smokeless tobacco: Former Systems developer     Quit date: 06/16/2013   ??? Tobacco comment: started to smoke at age 65 / only smoked 0.5 p.p.d    Substance and Sexual Activity   ??? Alcohol use: No     Alcohol/week: 0.0 standard drinks   ??? Drug use: No   ??? Sexual activity: Never   Lifestyle   ??? Physical activity     Days per week: Not on file     Minutes per session: Not on file   ??? Stress: Not on file   Relationships   ??? Social Product manager on phone: Not on file     Gets together: Not on file     Attends religious service: Not on file     Active member of club or organization: Not on file     Attends meetings of clubs or organizations: Not on file     Relationship status: Not on file   ??? Intimate partner violence     Fear of current or ex partner: Not on file     Emotionally abused: Not on file     Physically abused: Not  on file     Forced sexual activity: Not on file   Other Topics Concern   ??? Not on file   Social History Narrative    Works as Scientist, water quality at [employer]: Adhesive tape; Seasonal; and Talwin [pentazocine]    CURRENT MEDICATIONS  Prior to Admission medications    Medication Sig Start Date End Date Taking? Authorizing Provider   gabapentin (NEURONTIN) 300 MG capsule TAKE ONE CAPSULE BY MOUTH THREE TIMES A DAY 05/18/19 06/17/19  Stephan Minister, MD   lidocaine (LIDODERM) 5 % Place 1 patch onto the skin daily 12 hours on, 12 hours off. 04/05/19   Sharmaine Base Wanninger, PA-C   nitroGLYCERIN (NITROSTAT) 0.4 MG SL tablet up to max of 3 total doses. If no relief after 1 dose, call 911. 03/31/19   Luiz Iron, APRN - CNP   rosuvastatin (CRESTOR) 10 MG tablet Take  1 tablet by mouth daily 03/24/19   Luiz Iron, APRN - CNP   ranolazine (RANEXA) 500 MG extended release tablet TAKE ONE TABLET BY MOUTH TWICE A DAY 03/22/19   Luiz Iron, APRN - CNP   prasugrel (EFFIENT) 10 MG TABS Take 1 tablet by mouth daily 03/22/19   Luiz Iron, APRN - CNP   torsemide (DEMADEX) 20 MG tablet Take 1 tablet by mouth daily as needed (swelling) 03/22/19   Luiz Iron, APRN - CNP   spironolactone (ALDACTONE) 25 MG tablet TAKE ONE AND ONE-HALF TABLETS BY MOUTH EVERY DAY  Patient taking differently: Take 25 mg by mouth daily TAKE ONE AND ONE-HALF TABLETS BY MOUTH EVERY DAY/ patient taking one tablet daily 02/24/19   Stephan Minister, MD   metoprolol (LOPRESSOR) 100 MG tablet TAKE 1 AND 1/2 TABLET BY MOUTH TWO TIMES A DAY 02/24/19   Stephan Minister, MD   omeprazole (PRILOSEC) 40 MG delayed release capsule TAKE 1 CAPSULE BY MOUTH ONE TIME A DAY 02/24/19   Stephan Minister, MD   lisinopril (PRINIVIL;ZESTRIL) 40 MG tablet TAKE 1 TABLET BY MOUTH ONE TIME A DAY 02/24/19   Stephan Minister, MD   montelukast (SINGULAIR) 10 MG tablet TAKE 1 TABLET BY MOUTH ONE TIME A DAY 02/24/19   Stephan Minister, MD   diclofenac sodium 1 % GEL Apply 2 g topically 2 times daily 09/16/18   Stephan Minister, MD   triamcinolone (KENALOG) 0.1 % cream Apply to affected area twice daily for up to 2 weeks or until improved. 07/04/18   Francisca December, MD   zoster recombinant adjuvanted vaccine [facility]) 50 MCG SUSR injection .26mL IM X 1 followed by .20mL IM 2-6 months after dose #1  Patient not taking: Reported on 03/22/2019 08/26/17   Harvel Quale, MD   aspirin 81 MG EC tablet Take 1 tablet by mouth daily 02/16/17   Harvel Quale, MD   triamcinolone (KENALOG) 0.1 % cream Apply topically 2 times daily.Apply with Q tip in each ear BID for 1 week. 01/04/17   Alvina Chou, MD   mupirocin (BACTROBAN) 2 % ointment Apply topically  Daily.Apply with Q tip in each naris at night 01/04/17   Alvina Chou, MD   albuterol sulfate HFA (PROVENTIL HFA) 108 (90 BASE) MCG/ACT inhaler Inhale 2 puffs into the lungs every 6 hours as needed for Wheezing 12/27/15   Harvel Quale, MD   Multiple Vitamins-Minerals (MULTIVITAMIN PO) Take 1 tablet by mouth daily     Historical  Provider, MD   CALCIUM-VITAMIN D PO Take 1 tablet by mouth daily     Historical Provider, MD   cetirizine (ZYRTEC ALLERGY) 10 MG tablet Take 10 mg by mouth daily.    Historical Provider, MD       REVIEW OF SYSTEMS  The following systems were reviewed and revealed the following in addition to any already discussed in the HPI:    CONSTITUTIONAL: no weight loss, no fever, no night sweats, no chills  EYES: no vision changes, no blurry vision  EARS: Left hearing loss  NOSE: no epistaxis, no rhinorrhea  RESPIRATORY: no  Difficulty breathing, no shortness of breath  CV: no chest pain, no Peripheral vascular disease  HEME: No coagulation disorder, no Bleeding disorder  NEURO: no TIA or stroke-like symptoms  SKIN: No new rashes in the head and neck, no recent skin cancers  MOUTH: No new ulcers, no recent teeth infections  GASTROINTESTINAL: No diarrhea, stomach pain  PSYCH: No anxiety, no depression      PHYSICAL EXAM  BP 133/66 (Site: Left Lower Arm, Position: Sitting, Cuff Size: Medium Adult)    Pulse 61    Temp 98.7 ??F (37.1 ??C) (Oral)    Ht 5\' 6"  (1.676 m)    Wt (!) 309 lb (140.2 kg)    BMI 49.87 kg/m??     GENERAL: No Acute Distress, Alert and Oriented, no Hoarseness, strong voice  EYES: EOMI, Anti-icteric  HENT:   Head: Normocephalic and atraumatic.   Face:  Symmetric, facial nerve intact, no sinus tenderness  Right Ear: Normal external ear, normal external auditory canal, intact tympanic membrane with normal mobility and aerated middle ear  Left Ear: Normal external ear, normal external auditory canal, intact tympanic membrane with normal mobility and aerated middle ear  Mouth/Oral Cavity:  normal lips, Uvula is midline, no mucosal lesions, no  trismus, normal dentition, normal salivary quality/flow  Oropharynx/Larynx:  normal oropharynx, normal tonsils; normal larynx/nasopharynx on mirror exam  Nose:Normal external nasal appearance.  Anterior rhinoscopy shows a normal septum.  normal turbinates.  Normal mucosa   NECK: Normal range of motion, no thyromegaly, trachea is midline, no lymphadenopathy, no neck masses, no crepitus  CHEST: Normal respiratory effort, no retractions, breathing comfortably  SKIN: No rashes, normal appearing skin, no evidence of skin lesions/tumors  Neuro:  cranial nerve II-XII intact; normal gait  Cardio:  no edema        ASSESSMENT/PLAN  1. Furuncle of ear  This has resolved.  She is to follow-up with me in 3 months.  Follow-up sooner if this seems to be coming back.    2. Other specified hearing loss of left ear, unspecified hearing status on contralateral side  Given that the patient is complaining of subjective hearing loss of the left ear, I have ordered an audiogram for the next visit.             I have performed a head and neck physical exam personally or was physically present during the key or critical portions of the service.    Medical Decision Making:  The following items were considered in medical decision making:  Independent review of images  Review / order clinical lab tests  Review / order radiology tests  Decision to obtain old records

## 2019-06-26 MED ORDER — ROSUVASTATIN CALCIUM 10 MG PO TABS
10 MG | ORAL_TABLET | ORAL | 1 refills | Status: DC
Start: 2019-06-26 — End: 2019-08-22

## 2019-06-29 NOTE — Telephone Encounter (Signed)
called pt/ scheduled for 07/06/2019 @ 3:30

## 2019-06-29 NOTE — Telephone Encounter (Signed)
Pt called spoke with call center to make an apt.  I called left message to return my call

## 2019-06-29 NOTE — Telephone Encounter (Signed)
Wants an appointment    Dark mole under right breast and also on eyelid    [phone]

## 2019-06-29 NOTE — Telephone Encounter (Addendum)
 Hx of melanoma.   Last skin check 06/2018.    Called and left message for patient to call back office.     Please offer patient 07/06/19 @ 3:30pm.

## 2019-07-06 ENCOUNTER — Inpatient Hospital Stay: Admit: 2019-07-06 | Payer: PRIVATE HEALTH INSURANCE | Primary: Family

## 2019-07-06 ENCOUNTER — Encounter

## 2019-07-06 ENCOUNTER — Ambulatory Visit: Admit: 2019-07-06 | Discharge: 2019-07-06 | Payer: PRIVATE HEALTH INSURANCE | Attending: Dermatology | Primary: Family

## 2019-07-06 DIAGNOSIS — Z1231 Encounter for screening mammogram for malignant neoplasm of breast: Secondary | ICD-10-CM

## 2019-07-06 DIAGNOSIS — L918 Other hypertrophic disorders of the skin: Secondary | ICD-10-CM

## 2019-07-06 NOTE — Progress Notes (Signed)
[facility]  Antonietta Breach, MD  [place]  1956/08/29    63 y.o. female     Date of Visit: 07/06/2019    Chief Complaint: skin lesions    History of Present Illness:    1.  She complains of a couple of persistent lesions on the right upper eyelid.     2.  She complains of a lesion on the left lower cheek.  Can express foul smelling material at times.     3.  She complains of few lesions under the eyes.     4.  She also complains of an asymptomatic lesion below the left breast.     5.  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 Caryn Section on 12/29/2017. ??[place] class 1A signature.?? She denies any signs of recurrence.       Review of Systems:  Skin: No new or changing moles.    Past Medical History, Family History, Surgical History, Medications and Allergies reviewed.    Past Medical History:   Diagnosis Date   ??? Anxiety    ??? Asthma    ??? Blood circulation, collateral    ??? CAD (coronary artery disease)    ??? CHF (congestive heart failure) ([place])    ??? Chronic back pain    ??? Deep vein thrombosis    ??? 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 Depression    ??? GERD (gastroesophageal reflux disease)     NO LONGER SINCE LAP   ??? GERD (gastroesophageal reflux disease) 01/23/2009   ??? Hx of blood clots    ??? Hyperlipidemia     hx; resolved with lap band   ??? Hypertension    ??? Idiopathic peripheral neuropathy 03/26/2017   ??? Melanoma ([place])     upper back   ??? Obesity     hx of; had lap band   ??? Obstructive sleep apnea 09/18/2015     Updating Deprecated Diagnoses   ??? Obstructive sleep apnea (adult) (pediatric) 09/18/2015   ??? Pulmonary embolism [facility])      Past Surgical History:   Procedure Laterality Date   ??? BACK SURGERY      neck  plates   ??? BREAST SURGERY      left lumpectomy   ??? CESAREAN SECTION     ??? CHOLECYSTECTOMY     ??? COLONOSCOPY     ??? COLONOSCOPY  04/08/10   ???  CORONARY ANGIOPLASTY     ??? DIAGNOSTIC CARDIAC CATH LAB PROCEDURE     ??? DILATATION, ESOPHAGUS     ??? ENDOSCOPY, COLON, DIAGNOSTIC     ??? HYSTERECTOMY, VAGINAL     ??? LAP BAND  05/01/08    Dr. Nicole Cella   ??? OTHER SURGICAL HISTORY      [place] Filter: curently in place   ??? OTHER SURGICAL HISTORY  07/05/13    lap band removal   ??? SHOULDER SURGERY     ??? SKIN CANCER EXCISION  12/29/2017   ??? UPPER GASTROINTESTINAL ENDOSCOPY  05/19/13   ??? UPPER GASTROINTESTINAL ENDOSCOPY  07/21/13    ESOPHAGEAL STENT PLACEMENT   ??? UPPER GASTROINTESTINAL ENDOSCOPY N/A 07/31/2014    Esophagogastroduodenoscopy with esophageal balloon dilation   ??? VASCULAR SURGERY  04/2015    Lucendia Herrlich, celiac artery angiogram, normal abd arteries       Allergies  Allergen Reactions   ??? Adhesive Tape      Local itching   ??? Seasonal    ??? Talwin [Pentazocine] Other (See Comments)     dizzy     Outpatient Medications Marked as Taking for the 07/06/19 encounter (Office Visit) with Francisca December, MD   Medication Sig Dispense Refill   ??? rosuvastatin (CRESTOR) 10 MG tablet TAKE ONE TABLET BY MOUTH DAILY 30 tablet 1   ??? gabapentin (NEURONTIN) 300 MG capsule TAKE ONE CAPSULE BY MOUTH THREE TIMES A DAY 90 capsule 0   ??? lidocaine (LIDODERM) 5 % Place 1 patch onto the skin daily 12 hours on, 12 hours off. 30 patch 0   ??? nitroGLYCERIN (NITROSTAT) 0.4 MG SL tablet up to max of 3 total doses. If no relief after 1 dose, call 911. 25 tablet 3   ??? prasugrel (EFFIENT) 10 MG TABS Take 1 tablet by mouth daily 90 tablet 3   ??? torsemide (DEMADEX) 20 MG tablet Take 1 tablet by mouth daily as needed (swelling) 30 tablet 1   ??? spironolactone (ALDACTONE) 25 MG tablet TAKE ONE AND ONE-HALF TABLETS BY MOUTH EVERY DAY (Patient taking differently: Take 25 mg by mouth daily TAKE ONE AND ONE-HALF TABLETS BY MOUTH EVERY DAY/ patient taking one tablet daily) 135 tablet 1   ??? metoprolol (LOPRESSOR) 100 MG tablet TAKE 1 AND 1/2 TABLET BY MOUTH TWO TIMES A DAY 270 tablet 1   ??? omeprazole (PRILOSEC) 40 MG  delayed release capsule TAKE 1 CAPSULE BY MOUTH ONE TIME A DAY 90 capsule 1   ??? lisinopril (PRINIVIL;ZESTRIL) 40 MG tablet TAKE 1 TABLET BY MOUTH ONE TIME A DAY 90 tablet 1   ??? montelukast (SINGULAIR) 10 MG tablet TAKE 1 TABLET BY MOUTH ONE TIME A DAY 90 tablet 1   ??? diclofenac sodium 1 % GEL Apply 2 g topically 2 times daily 1 Tube 3   ??? zoster recombinant adjuvanted vaccine (SHINGRIX) 50 MCG SUSR injection .22mL IM X 1 followed by .37mL IM 2-6 months after dose #1 0.5 mL 0   ??? aspirin 81 MG EC tablet Take 1 tablet by mouth daily 30 tablet 11   ??? triamcinolone (KENALOG) 0.1 % cream Apply topically 2 times daily.Apply with Q tip in each ear BID for 1 week. 15 g 1   ??? mupirocin (BACTROBAN) 2 % ointment Apply topically  Daily.Apply with Q tip in each naris at night 15 g 0   ??? albuterol sulfate HFA (PROVENTIL HFA) 108 (90 BASE) MCG/ACT inhaler Inhale 2 puffs into the lungs every 6 hours as needed for Wheezing 3 Inhaler 3   ??? Multiple Vitamins-Minerals (MULTIVITAMIN PO) Take 1 tablet by mouth daily      ??? CALCIUM-VITAMIN D PO Take 1 tablet by mouth daily      ??? cetirizine (ZYRTEC ALLERGY) 10 MG tablet Take 10 mg by mouth daily.         Physical Examination       The following were examined and determined to be normal: Psych/Neuro, Scalp/hair, Conjunctivae/eyelids, Gums/teeth/lips, Neck, Breast/axilla/chest, Abdomen, Back, RUE, LUE, RLE, LLE and Nails/digits.    The following were examined and determined to be abnormal: Head/face.     Well-appearing.    1.  Right upper eyelid with a pedunculated skin colored papule.    2.  Left medial lower cheek with a round skin colored papule with overlying punctum.    3.  Lower eyelids with round smooth skin colored papules.  4.  Inferior aspect the left breast with a round stuck on appearing slightly verrucous brown papule.    5.  Central upper back with a well-healed linear surgical scar.        Assessment and Plan     1. Skin tags - 2 on the right upper eyelid.    Reassurance.   Treated with 2 cycles of LN2 using a forceps dipped in LN2.  No charge.      2. Epidermoid cyst     Reassurance.      3. Syringomas of lower periocular region    Reassurance.      4. SK (seborrheic keratosis)     Reassurance.      5. History of melanoma of the back - no signs of recurrence.     Sun protective behaviors and monthly self skin examinations were encouraged.  Call for any new or concerning lesions.           Return in about 6 months (around 01/06/2020).

## 2019-07-11 NOTE — Telephone Encounter (Signed)
Pt states she is having sob, aching in arms from shoulders to elbows, swelling in feet and hands,  feels bloated and feels weak x mo. I have her scheduled for Monday 07/17/19 when NPTS is back in office. Is that going to be soon enough. Please advise if pt needs to be seen sooner.

## 2019-07-11 NOTE — Telephone Encounter (Signed)
Needs appointment with [facility] or np sooner

## 2019-07-12 NOTE — Telephone Encounter (Signed)
LM for pt to call to let us know if she wants to move NPT appt up to tomorrow with NPKA or NPKL or if she is seeing the PCP tomorrow for the same reason then she can see him and then see NPT on Mon

## 2019-07-13 ENCOUNTER — Ambulatory Visit: Admit: 2019-07-13 | Discharge: 2019-07-13 | Payer: PRIVATE HEALTH INSURANCE | Attending: Family | Primary: Family

## 2019-07-13 ENCOUNTER — Inpatient Hospital Stay: Payer: PRIVATE HEALTH INSURANCE | Primary: Family

## 2019-07-13 ENCOUNTER — Ambulatory Visit
Admit: 2019-07-13 | Discharge: 2019-07-13 | Payer: PRIVATE HEALTH INSURANCE | Attending: Nurse Practitioner | Primary: Family

## 2019-07-13 DIAGNOSIS — Z7689 Persons encountering health services in other specified circumstances: Secondary | ICD-10-CM

## 2019-07-13 DIAGNOSIS — Z114 Encounter for screening for human immunodeficiency virus [HIV]: Secondary | ICD-10-CM

## 2019-07-13 DIAGNOSIS — I2510 Atherosclerotic heart disease of native coronary artery without angina pectoris: Secondary | ICD-10-CM

## 2019-07-13 LAB — LIPID PANEL
Cholesterol, Total: 154 mg/dL (ref 0–199)
HDL: 44 mg/dL (ref 40–60)
LDL Calculated: 82 mg/dL (ref <100)
Triglycerides: 138 mg/dL (ref 0–150)
VLDL Cholesterol Calculated: 28 mg/dL

## 2019-07-13 LAB — COMPREHENSIVE METABOLIC PANEL
ALT: 24 U/L (ref 10–40)
AST: 23 U/L (ref 15–37)
Albumin/Globulin Ratio: 1.3 (ref 1.1–2.2)
Albumin: 3.9 g/dL (ref 3.4–5.0)
Alkaline Phosphatase: 79 U/L (ref 40–129)
Anion Gap: 10 (ref 3–16)
BUN: 16 mg/dL (ref 7–20)
CO2: 26 mmol/L (ref 21–32)
Calcium: 9.5 mg/dL (ref 8.3–10.6)
Chloride: 105 mmol/L (ref 99–110)
Creatinine: 0.8 mg/dL (ref 0.6–1.2)
GFR African American: >60 (ref >60)
GFR Non-African American: >60 (ref >60)
Globulin: 3.1 g/dL
Glucose: 121 mg/dL — ABNORMAL HIGH (ref 70–99)
Potassium: 4.4 mmol/L (ref 3.5–5.1)
Sodium: 141 mmol/L (ref 136–145)
Total Bilirubin: 0.6 mg/dL (ref 0.0–1.0)
Total Protein: 7 g/dL (ref 6.4–8.2)

## 2019-07-13 LAB — CBC WITH AUTO DIFFERENTIAL
Basophils %: 0.9 %
Basophils Absolute: 0.1 10*3/uL (ref 0.0–0.2)
Eosinophils %: 3.7 %
Eosinophils Absolute: 0.3 10*3/uL (ref 0.0–0.6)
Hematocrit: 40.2 % (ref 36.0–48.0)
Hemoglobin: 13.4 g/dL (ref 12.0–16.0)
Lymphocytes %: 35.1 %
Lymphocytes Absolute: 2.5 10*3/uL (ref 1.0–5.1)
MCH: 30.2 pg (ref 26.0–34.0)
MCHC: 33.5 g/dL (ref 31.0–36.0)
MCV: 90.3 fL (ref 80.0–100.0)
MPV: 9.5 fL (ref 5.0–10.5)
Monocytes %: 9.6 %
Monocytes Absolute: 0.7 10*3/uL (ref 0.0–1.3)
Neutrophils %: 50.7 %
Neutrophils Absolute: 3.6 10*3/uL (ref 1.7–7.7)
Platelets: 153 10*3/uL (ref 135–450)
RBC: 4.45 M/uL (ref 4.00–5.20)
RDW: 14.1 % (ref 12.4–15.4)
WBC: 7 10*3/uL (ref 4.0–11.0)

## 2019-07-13 LAB — BRAIN NATRIURETIC PEPTIDE: Pro-BNP: 190 pg/mL — ABNORMAL HIGH (ref 0–124)

## 2019-07-13 LAB — HEPATITIS C ANTIBODY: Hep C Ab Interp: NONREACTIVE

## 2019-07-13 MED ORDER — ALBUTEROL SULFATE HFA 108 (90 BASE) MCG/ACT IN AERS
108 (90 Base) MCG/ACT | Freq: Four times a day (QID) | RESPIRATORY_TRACT | 3 refills | Status: AC | PRN
Start: 2019-07-13 — End: ?

## 2019-07-13 MED ORDER — METOPROLOL TARTRATE 100 MG PO TABS
100 MG | ORAL_TABLET | ORAL | 1 refills | Status: DC
Start: 2019-07-13 — End: 2020-04-03

## 2019-07-13 NOTE — Progress Notes (Signed)
dme  SUBJECTIVE:    Patient ID: Rachael Mays is a 63 y.o. female.    CC: New patient appointment    HPI: The patient presents to the office to establish with a new primary care provider.    Previous PCP was Dr. Awanda Mink.    She has a history of dyslipidemia.  She is on Crestor.    She has a history of restless leg syndrome.  She takes gabapentin nightly which helps.    She has a history of coronary artery disease, congestive heart failure, and hypertension.  She is a patient of cardiology and she follows up regularly.  She is on Ranexa, Effient, torsemide, Aldactone, lisinopril.    She has a history of lap band surgery.  This caused an erosion and she has been on Prilosec.    She has a history of asthma.  She uses albuterol as needed.    She has complaint of shortness of breath.  She feels this is different than her normal shortness of breath associated with obesity.  She denies any chest pain.  She does not believe she has been gaining weight.    She has pain and swelling in her left knee which is intermittent.  No treatments at home.        Past Medical History:   Diagnosis Date   ??? Anxiety    ??? Asthma    ??? Blood circulation, collateral    ??? CAD (coronary artery disease)    ??? CHF (congestive heart failure) ([place])    ??? Chronic back pain    ??? Deep vein thrombosis    ??? 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 Depression    ??? GERD (gastroesophageal reflux disease)     NO LONGER SINCE LAP   ??? GERD (gastroesophageal reflux disease) 01/23/2009   ??? Hx of blood clots    ??? Hyperlipidemia     hx; resolved with lap band   ??? Hypertension    ??? Idiopathic peripheral neuropathy 03/26/2017   ??? Melanoma ([place])     upper back   ??? Obesity     hx of; had lap band   ??? Obstructive sleep apnea 09/18/2015     Updating Deprecated Diagnoses   ??? Obstructive sleep apnea (adult) (pediatric) 09/18/2015   ??? Pulmonary embolism [facility])         Past Surgical History:   Procedure Laterality Date   ??? BACK SURGERY      neck  plates   ???  BREAST SURGERY      left lumpectomy   ??? CESAREAN SECTION     ??? CHOLECYSTECTOMY     ??? COLONOSCOPY     ??? COLONOSCOPY  04/08/10   ??? CORONARY ANGIOPLASTY     ??? DIAGNOSTIC CARDIAC CATH LAB PROCEDURE     ??? DILATATION, ESOPHAGUS     ??? ENDOSCOPY, COLON, DIAGNOSTIC     ??? HYSTERECTOMY, VAGINAL     ??? LAP BAND  05/01/08    Dr. Nicole Cella   ??? OTHER SURGICAL HISTORY      [place] Filter: curently in place   ??? OTHER SURGICAL HISTORY  07/05/13    lap band removal   ??? SHOULDER SURGERY     ??? SKIN CANCER EXCISION  12/29/2017   ??? UPPER GASTROINTESTINAL ENDOSCOPY  05/19/13   ??? UPPER GASTROINTESTINAL ENDOSCOPY  07/21/13    ESOPHAGEAL STENT PLACEMENT   ??? UPPER GASTROINTESTINAL ENDOSCOPY N/A 07/31/2014    Esophagogastroduodenoscopy with esophageal  balloon dilation   ??? VASCULAR SURGERY  04/2015    Lucendia Herrlich, celiac artery angiogram, normal abd arteries       Family History   Problem Relation Age of Onset   ??? Arthritis Mother    ??? Cancer Mother    ??? Depression Mother    ??? High Blood Pressure Mother    ??? Heart Disease Mother 55        MI    ??? Ovarian Cancer Mother 56   ??? Cancer Father 91        colon   ??? Heart Disease Maternal Grandmother    ??? Early Death Paternal Grandmother    ??? Heart Disease Paternal Grandfather    ??? Diabetes Other    ??? High Blood Pressure Other    ??? Obesity Other    ??? Other Sister         OSA   ??? Other Brother         OSA   ??? Other Brother         OSA   ??? Other Daughter         OSA       Social History     Socioeconomic History   ??? Marital status: Divorced     Spouse name: [name]   ??? Number of children: 2   ??? Years of education: 36   ??? Highest education level: Not on file   Occupational History   ??? Not on file   Social Needs   ??? Financial resource strain: Not on file   ??? Food insecurity     Worry: Not on file     Inability: Not on file   ??? Transportation needs     Medical: Not on file     Non-medical: Not on file   Tobacco Use   ??? Smoking status: Former Smoker     Packs/day: 0.50     Years: 40.00     Pack years: 20.00     Types:  Cigarettes     Last attempt to quit: 08/07/2012     Years since quitting: 6.9   ??? Smokeless tobacco: Former Systems developer     Quit date: 06/16/2013   ??? Tobacco comment: started to smoke at age 51 / only smoked 0.5 p.p.d    Substance and Sexual Activity   ??? Alcohol use: No     Alcohol/week: 0.0 standard drinks   ??? Drug use: No   ??? Sexual activity: Not Currently   Lifestyle   ??? Physical activity     Days per week: Not on file     Minutes per session: Not on file   ??? Stress: Not on file   Relationships   ??? Social Product manager on phone: Not on file     Gets together: Not on file     Attends religious service: Not on file     Active member of club or organization: Not on file     Attends meetings of clubs or organizations: Not on file     Relationship status: Not on file   ??? Intimate partner violence     Fear of current or ex partner: Not on file     Emotionally abused: Not on file     Physically abused: Not on file     Forced sexual activity: Not on file   Other Topics Concern   ??? Not on file   Social History Narrative  Works as Scientist, water quality at [employer]            Current Outpatient Medications on File Prior to Visit   Medication Sig Dispense Refill   ??? rosuvastatin (CRESTOR) 10 MG tablet TAKE ONE TABLET BY MOUTH DAILY 30 tablet 1   ??? lidocaine (LIDODERM) 5 % Place 1 patch onto the skin daily 12 hours on, 12 hours off. 30 patch 0   ??? nitroGLYCERIN (NITROSTAT) 0.4 MG SL tablet up to max of 3 total doses. If no relief after 1 dose, call 911. 25 tablet 3   ??? ranolazine (RANEXA) 500 MG extended release tablet TAKE ONE TABLET BY MOUTH TWICE A DAY 180 tablet 2   ??? prasugrel (EFFIENT) 10 MG TABS Take 1 tablet by mouth daily 90 tablet 3   ??? torsemide (DEMADEX) 20 MG tablet Take 1 tablet by mouth daily as needed (swelling) 30 tablet 1   ??? spironolactone (ALDACTONE) 25 MG tablet TAKE ONE AND ONE-HALF TABLETS BY MOUTH EVERY DAY (Patient taking differently: Take 25 mg by mouth daily TAKE ONE AND ONE-HALF TABLETS BY MOUTH EVERY DAY/  patient taking one tablet daily) 135 tablet 1   ??? metoprolol (LOPRESSOR) 100 MG tablet TAKE 1 AND 1/2 TABLET BY MOUTH TWO TIMES A DAY 270 tablet 1   ??? omeprazole (PRILOSEC) 40 MG delayed release capsule TAKE 1 CAPSULE BY MOUTH ONE TIME A DAY 90 capsule 1   ??? lisinopril (PRINIVIL;ZESTRIL) 40 MG tablet TAKE 1 TABLET BY MOUTH ONE TIME A DAY 90 tablet 1   ??? montelukast (SINGULAIR) 10 MG tablet TAKE 1 TABLET BY MOUTH ONE TIME A DAY 90 tablet 1   ??? diclofenac sodium 1 % GEL Apply 2 g topically 2 times daily 1 Tube 3   ??? aspirin 81 MG EC tablet Take 1 tablet by mouth daily 30 tablet 11   ??? triamcinolone (KENALOG) 0.1 % cream Apply topically 2 times daily.Apply with Q tip in each ear BID for 1 week. 15 g 1   ??? mupirocin (BACTROBAN) 2 % ointment Apply topically  Daily.Apply with Q tip in each naris at night 15 g 0   ??? albuterol sulfate HFA (PROVENTIL HFA) 108 (90 BASE) MCG/ACT inhaler Inhale 2 puffs into the lungs every 6 hours as needed for Wheezing 3 Inhaler 3   ??? Multiple Vitamins-Minerals (MULTIVITAMIN PO) Take 1 tablet by mouth daily      ??? CALCIUM-VITAMIN D PO Take 1 tablet by mouth daily      ??? cetirizine (ZYRTEC ALLERGY) 10 MG tablet Take 10 mg by mouth daily.     ??? gabapentin (NEURONTIN) 300 MG capsule TAKE ONE CAPSULE BY MOUTH THREE TIMES A DAY 90 capsule 0   ??? zoster recombinant adjuvanted vaccine (SHINGRIX) 50 MCG SUSR injection .66mL IM X 1 followed by .75mL IM 2-6 months after dose #1 0.5 mL 0     No current facility-administered medications on file prior to visit.        Allergies   Allergen Reactions   ??? Adhesive Tape      Local itching   ??? Seasonal    ??? Talwin [Pentazocine] Other (See Comments)     dizzy            Review of Systems   Constitutional: Negative.    Respiratory: Positive for shortness of breath.    Cardiovascular: Negative.    Gastrointestinal: Negative.    Genitourinary: Negative.    Musculoskeletal: Positive for arthralgias, gait problem and joint  swelling.   Psychiatric/Behavioral: Negative.           OBJECTIVE:  Physical Exam  Vitals signs reviewed.   Constitutional:       General: She is not in acute distress.     Appearance: She is well-developed. She is not diaphoretic.   HENT:      Head: Normocephalic and atraumatic.   Eyes:      General: No scleral icterus.     Conjunctiva/sclera: Conjunctivae normal.   Neck:      Musculoskeletal: Neck supple.      Vascular: No JVD.   Cardiovascular:      Rate and Rhythm: Normal rate and regular rhythm.   Pulmonary:      Effort: Pulmonary effort is normal. No respiratory distress.      Breath sounds: Normal breath sounds. No wheezing.   Abdominal:      General: There is no distension.      Palpations: Abdomen is soft.      Tenderness: There is no abdominal tenderness. There is no guarding or rebound.   Musculoskeletal: Normal range of motion.   Skin:     General: Skin is warm and dry.   Neurological:      Mental Status: She is alert and oriented to person, place, and time.   Psychiatric:         Behavior: Behavior normal.         Thought Content: Thought content normal.        BP (!) 144/86    Pulse 60    Temp 97.2 ??F (36.2 ??C) (Temporal)    Ht 5' 6.2" (1.681 m)    Wt (!) 311 lb (141.1 kg)    BMI 49.89 kg/m??        PHQ Scores 04/09/2016 02/01/2014   PHQ2 Score 4 3   PHQ9 Score 4 18     Interpretation of Total Score DepressionSeverity: 1-4 = Minimal depression, 5-9 = Mild depression, 10-14 = Moderate depression, 15-19 = Moderately severe depression, 20-27 = Severe depression         ASSESSMENT/PLAN:  Sarika was seen today for established new doctor and other.  Diagnoses and all orders for this visit:    Encounter to establish care  -H&P reviewed and scanned into EMR  -Oriented to provider and practice    Coronary artery disease due to lipid rich plaque  -History of coronary artery disease on a number of medications.  She follows with cardiology  -Continue current regimen    Chronic diastolic congestive heart failure ([place])  -History of congestive heart failure  -  Continue current regimen and follow-up with cardiology    Pulmonary hypertension  -History of pulmonary hypertension.  -Continue current regimen and follow-up with cardiology    Essential hypertension  -Blood pressure mildly elevated on a number of antihypertensives  -Continue current regimen    Dyslipidemia  -Chronic  -Continue current regimen    Mild intermittent asthma without complication  -     albuterol sulfate HFA (PROVENTIL HFA) 108 (90 Base) MCG/ACT inhaler; Inhale 2 puffs into the lungs every 6 hours as needed for Wheezing    Impaired fasting glucose  -     Hemoglobin A1C; Future    Shortness of breath  -     CBC WITH AUTO DIFFERENTIAL; Future    Pain and swelling of left knee  -     DME Order for (Specify) as OP    Morbid obesity with body mass  index (BMI) of 45.0 to 49.9 in adult [facility])    Screening for HIV (human immunodeficiency virus)  -     HIV Screen; Future    Need for hepatitis C screening test  -     Hepatitis C Antibody; Future    Need for pneumococcal vaccination  -     PNEUMOVAX 23 subcutaneous/IM (Pneumococcal polysaccharide vaccine 23-valent >= 2yo)        Almira Coaster, APRN - CNP

## 2019-07-13 NOTE — Patient Instructions (Addendum)
Decrease metoprolol to 100 mg twice a day    Schedule rheumatologist appointment     Take Demadex an extra day during the week.     Return to see me in 4 weeks

## 2019-07-13 NOTE — Progress Notes (Signed)
[facility]     Outpatient Follow Up Note    CHIEF COMPLAINT / HPI:  Follow Up secondary to chronic diastolic heart failure, coronary artery disease    Subjective:   Rachael Mays is 63 y.o. female who presents today with a history of CAD??s/p PTCA LAD April '17, d-HF, DVT/PE, s/p [place] filter, HTN??and hyperlipidemia. She experienced chronic chest pain and SOB post stent in '17. She was worked up by Dr. Jeb Levering who ruled out a PE. A CXR that day did suggest congestive heart failure. An increase in diuretics helped with swelling, but not her chest pain/SOB. 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 ??It was hypothesized that this may be contributing to the left-sided chest discomfort.  Lexiscan 03/2019 showed normal perfusion.     Today, Rachael Mays complains of bilateral chest/shoulder pain which has been unchanged since her LOV in April. She states the pain/stiffness is present when she wakes up the morning and then comes and go through the day. She also complains on increased fatigue which has been worsening over the past few months. Rachael Mays feels she doesn't have the energy she should. She states that she has put ~10 pounds in three months and she notices an increase in swelling in her hands and feet and shortness of breath. Rachael Mays takes her Torsemide weekly. 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 Rachael Mays states she has not been taking her Ranexa as she cannot afford it. She is compliant with here bipap. She states that she was not able to get in to the rheumatologist in the past due to insurance issues. Now that she has [employer], she would like to try to get in again to the doctor. She was also seeing a pain specialist in the past but when her insurance changed with a divorce, she could not afford to continue with that doctor. Rachael Mays denies palpitations and  dizziness.     Past Medical History:   Diagnosis Date   ??? Anxiety    ??? Asthma    ??? Blood circulation, collateral    ??? CAD (coronary artery disease)    ??? CHF (congestive heart failure) ([place])    ??? Chronic back pain    ??? Deep vein thrombosis    ??? 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 Depression    ??? GERD (gastroesophageal reflux disease)     NO LONGER SINCE LAP   ??? GERD (gastroesophageal reflux disease) 01/23/2009   ??? Hx of blood clots    ??? Hyperlipidemia     hx; resolved with lap band   ??? Hypertension    ??? Idiopathic peripheral neuropathy 03/26/2017   ??? Melanoma ([place])     upper back   ??? Obesity     hx of; had lap band   ??? Obstructive sleep apnea 09/18/2015     Updating Deprecated Diagnoses   ??? Obstructive sleep apnea (adult) (pediatric) 09/18/2015   ??? Pulmonary embolism (HCC)      Social History:    Social History     Tobacco Use   Smoking Status Former Smoker   ??? Packs/day: 0.50   ??? Years: 40.00   ??? Pack years: 20.00   ??? Types: Cigarettes   ??? Last attempt to quit: 08/07/2012   ??? Years since quitting: 6.9  Smokeless Tobacco Former Systems developer   ??? Quit date: 06/16/2013   Tobacco Comment    started to smoke at age 48 / only smoked 0.5 p.p.d      Current Medications:  Current Outpatient Medications   Medication Sig Dispense Refill   ??? rosuvastatin (CRESTOR) 10 MG tablet TAKE ONE TABLET BY MOUTH DAILY 30 tablet 1   ??? gabapentin (NEURONTIN) 300 MG capsule TAKE ONE CAPSULE BY MOUTH THREE TIMES A DAY 90 capsule 0   ??? lidocaine (LIDODERM) 5 % Place 1 patch onto the skin daily 12 hours on, 12 hours off. 30 patch 0   ??? nitroGLYCERIN (NITROSTAT) 0.4 MG SL tablet up to max of 3 total doses. If no relief after 1 dose, call 911. 25 tablet 3   ??? ranolazine (RANEXA) 500 MG extended release tablet TAKE ONE TABLET BY MOUTH TWICE A DAY 180 tablet 2   ??? prasugrel (EFFIENT) 10 MG TABS Take 1 tablet by mouth daily 90 tablet 3   ??? torsemide (DEMADEX) 20 MG tablet Take 1 tablet by mouth daily as needed (swelling) 30 tablet 1   ???  spironolactone (ALDACTONE) 25 MG tablet TAKE ONE AND ONE-HALF TABLETS BY MOUTH EVERY DAY (Patient taking differently: Take 25 mg by mouth daily TAKE ONE AND ONE-HALF TABLETS BY MOUTH EVERY DAY/ patient taking one tablet daily) 135 tablet 1   ??? metoprolol (LOPRESSOR) 100 MG tablet TAKE 1 AND 1/2 TABLET BY MOUTH TWO TIMES A DAY 270 tablet 1   ??? omeprazole (PRILOSEC) 40 MG delayed release capsule TAKE 1 CAPSULE BY MOUTH ONE TIME A DAY 90 capsule 1   ??? lisinopril (PRINIVIL;ZESTRIL) 40 MG tablet TAKE 1 TABLET BY MOUTH ONE TIME A DAY 90 tablet 1   ??? montelukast (SINGULAIR) 10 MG tablet TAKE 1 TABLET BY MOUTH ONE TIME A DAY 90 tablet 1   ??? diclofenac sodium 1 % GEL Apply 2 g topically 2 times daily 1 Tube 3   ??? zoster recombinant adjuvanted vaccine (SHINGRIX) 50 MCG SUSR injection .58mL IM X 1 followed by .35mL IM 2-6 months after dose #1 0.5 mL 0   ??? aspirin 81 MG EC tablet Take 1 tablet by mouth daily 30 tablet 11   ??? triamcinolone (KENALOG) 0.1 % cream Apply topically 2 times daily.Apply with Q tip in each ear BID for 1 week. 15 g 1   ??? mupirocin (BACTROBAN) 2 % ointment Apply topically  Daily.Apply with Q tip in each naris at night 15 g 0   ??? albuterol sulfate HFA (PROVENTIL HFA) 108 (90 BASE) MCG/ACT inhaler Inhale 2 puffs into the lungs every 6 hours as needed for Wheezing 3 Inhaler 3   ??? Multiple Vitamins-Minerals (MULTIVITAMIN PO) Take 1 tablet by mouth daily      ??? CALCIUM-VITAMIN D PO Take 1 tablet by mouth daily      ??? cetirizine (ZYRTEC ALLERGY) 10 MG tablet Take 10 mg by mouth daily.       No current facility-administered medications for this visit.      REVIEW OF SYSTEMS:    CONSTITUTIONAL: No major weight gain or loss, fatigue, weakness, night sweats or fever.  HEENT: No new vision difficulties or ringing in the ears.  RESPIRATORY: Shortness of breath  CARDIOVASCULAR: See HPI  GI: No nausea, vomiting, diarrhea, constipation, abdominal pain or changes in bowel habits.  GU: No urinary frequency, urgency,  incontinence hematuria or dysuria.  SKIN: No cyanosis or skin lesions.  MUSCULOSKELETAL: No new muscle or  joint pain.  NEUROLOGICAL: No syncope or TIA-like symptoms.  PSYCHIATRIC: No anxiety, pain, insomnia or depression    Objective:   PHYSICAL EXAM:        VITALS:  BP (!) 140/60 (Site: Left Upper Arm, Position: Sitting, Cuff Size: Large Adult)    Pulse 50    Ht 5\' 6"  (1.676 m)    Wt (!) 311 lb 12.8 oz (141.4 kg)    SpO2 98%    BMI 50.33 kg/m??   CONSTITUTIONAL: Cooperative, no apparent distress, and appears well nourished / developed, obese  NEUROLOGIC:  Awake and orientated to person, place and time.  PSYCH: Calm affect.  SKIN: Warm and dry.  HEENT: Sclera non-icteric, normocephalic, neck supple, no elevation of JVP, normal carotid pulses with no bruits and thyroid normal size.  LUNGS:  No increased work of breathing and clear to auscultation, no crackles or wheezing  CARDIOVASCULAR:  Bradycardic HR 48 with no murmurs, gallops, rubs, or abnormal heart sounds, normal PMI.The apical impulses not displaced  JVP less than 8 cm H2O  Heart tones are crisp and normal  Cervical veins are not engorged  The carotid upstroke is normal in amplitude and contour without delay or bruit  JVP is not elevated  ABDOMEN:  Normal bowel sounds, non-distended and non-tender to palpation  EXT: Trace edema BLE, no calf tenderness. Pulses are present bilaterally.    DATA:    Lab Results   Component Value Date    ALT 30 03/23/2019    AST 27 03/23/2019    ALKPHOS 97 03/23/2019    BILITOT 0.6 03/23/2019     Lab Results   Component Value Date    CREATININE 0.9 03/23/2019    BUN 21 (H) 03/23/2019    NA 141 03/23/2019    K 4.6 03/23/2019    CL 101 03/23/2019    CO2 26 03/23/2019     Lab Results   Component Value Date    TSH 1.37 08/22/2015    T4TOTAL 6.6 04/17/2014     Lab Results   Component Value Date    WBC 5.8 05/07/2017    HGB 13.0 05/07/2017    HCT 39.1 05/07/2017    MCV 88.0 05/07/2017    PLT 168 05/07/2017     No components found for:  CHLPL  Lab Results   Component Value Date    TRIG 114 06/17/2016    TRIG 100 05/21/2016    TRIG 137 05/04/2014     Lab Results   Component Value Date    HDL 48 03/23/2019    HDL 43 06/17/2016    HDL 42 05/21/2016     Lab Results   Component Value Date    LDLCALC 115 (H) 03/23/2019    LDLCALC 52 06/17/2016    LDLCALC 65 05/21/2016     Lab Results   Component Value Date    LABVLDL 27 03/23/2019    LABVLDL 23 06/17/2016    LABVLDL 20 05/21/2016      No results found for: BNP  Radiology Review:  Pertinent images / reports were reviewed as a part of this visit and reveals the following:    Last Echo 08/05/16:  Left ventricle: Systolic function is normal. The estimated ejection fraction is 55-60%.  Mitral valve: There is mild regurgitation with multiple jets.   Aortic valve: There is mild stenosis.  Tricuspid valve: There is mild regurgitation     Last Stress Test 03/29/19:  Normal myocardial perfusion.  Normal LV size  and systolic function.     Last Angiogram 05/07/17:  Anatomy:   LM-normal  LAD-normal, widely patent stent mid  Cx-normal  OM1- normal  RCA-normal, right dominant  RPDA- normal  LVEF- 60%  ??  Impression:  1.  Normal coronary artery anatomy.  2.  Widely patent mid LAD stent.  3.  Normal LV systolic function.  4.  False positive stress test.  Likely breast artifact.  ??  Plan:  1.  Medical management.  2.  She needs workup of noncardiac causes of chest pain.  She now has to stress test with similar abnormalities most consistent with breast artifact.  The majority of her coronary angiograms have demonstrated normal anatomy except for the one that had a 70% mid LAD stenosis which was stented.  Her chest pain has atypical features.  Recommend rheumatology and possibly orthopedic evaluation.    Last ECG 05/05/19:  Normal sinus rhythm  Cannot rule out Anterior infarct age indeterminate   Abnormal ECG    Assessment:      Diagnosis Orders   1. Coronary artery disease involving native coronary artery of native heart  without angina pectoris   ~s/p PCI LAD (2017)  ~GXT (4/20): normal perfusion  ~Hx of false positive GXT in 2018 d/t breast attenuation   ~[facility] rec orthopedic/ rheumatology consults for atypical pain in the past  ~c/o fatigue, bilateral chest/shoulder pain, worse in the morning    2. Chronic diastolic congestive heart failure (HCC)   ~On spironolactone, demadex PRN (has been taking once a week)  ~Lungs clear, +trace edema BLE  ~c/o increased shortness of breath  Increase Demadex to 2x/week   3. Shortness of breath   ~Lungs clear, O2 98%  ~trace edema BLE  ~Has been taking PRN Demadex once a week  ~+ 10 lbs in 4 months  BNP   4. Muscle pain   ~Chest/Shoulder pain, worse in the mornings  ~Comes and goes through the day  ~Dr. Susy Manor recommended rheumatology referral in the past, couldn't get in d/t insurance issues.  [place] - Jinger Neighbors, Shanon Brow, MD, Rheumatology, [place]   5. Bradycardia   ~HR 48 on exam  ~EKG today: Bradycardia, HR 50  ~Feeling fatigued EKG  Decrease metoprolol to 100mg  BID     I had the opportunity to review the clinical symptoms and presentation of Rachael Mays.   Plan:     1. Decrease metoprolol to 100 mg BID  2. Referral placed for rheumatology  3.  Take Demadex 2x/week on off days.    Return to office in 4 weeks.     Overall the patient is stable from CV standpoint    I have addresed the patient's cardiac risk factors and adjusted pharmacologic treatment as needed. In addition, I have reinforced the need for patient directed risk factor modification.    Further evaluation will be based upon the patient's clinical course and testing results.    All questions and concerns were addressed to the patient. Alternatives to my treatment were discussed.      The patient is not currently smoking. The risks related to smoking were reviewed with the patient. Recommend maintaining a smoke-free lifestyle. Products available for smoking cessation were discussed in detail.    Patient is on a  beta-blocker  Patient is on an ace-i/ARB  Patient is on a statin    Dual Antiplatelet therapy / anti-coagulation has been recommended / prescribed for this patient. Education conducted on adverse reactions including bleeding was discussed.  Angiotension inhibitor/angiotension receptor blocker has not been prescribed / recommended for congestive heart failure (ordered for BP control. No hx of SHF). Daily weight, low sodium diet were discussed. Patient instructed to call the office with a weight gain: > 3 # over night or 5# in one week; swelling, SOB/orthopnea/PND    The patient verbalizes understanding not to stop medications without discussing with [place].    Discussed exercise: 30-60 minutes 7 days/week  Discussed Low saturated fat/NAS diet.     Thank you for allowing to [place] to participate in the care of Rachael Mays.    Electronically signed by Tommi Rumps, APRN - CNP on 07/13/2019 at 8:04 AM     Documentation of today's visit sent to PCP

## 2019-07-14 LAB — HEMOGLOBIN A1C
Hemoglobin A1C: 5.9 %
eAG: 122.6 mg/dL

## 2019-07-14 LAB — HIV SCREEN
HIV ANTIGEN: NONREACTIVE
HIV Ag/Ab: NONREACTIVE
HIV-1 Antibody: NONREACTIVE
HIV-2 Ab: NONREACTIVE

## 2019-07-14 NOTE — Telephone Encounter (Signed)
Pt called spoke with call center states [place] referred her to Dr Jinger Neighbors.  Referral is in Epic.  Sent message to Jocelyn Lamer to schedule

## 2019-07-14 NOTE — Addendum Note (Signed)
Addended by: Almira Coaster on: 07/14/2019 01:17 PM     Modules accepted: Level of Service

## 2019-07-17 ENCOUNTER — Encounter: Attending: Adult Health | Primary: Family

## 2019-07-17 NOTE — Telephone Encounter (Signed)
-----   Message from Luiz Iron, APRN - CNP sent at 07/17/2019  7:50 AM EDT -----  Labs look ok; recheck BMP in three months

## 2019-07-17 NOTE — Telephone Encounter (Signed)
-----   Message from Sarina Ill, RN sent at 07/17/2019  1:45 PM EDT -----  Subject: Message to Provider    QUESTIONS  Information for Provider? (2nd Request) Pt is calling in to follow up on   her Pain management referral from MD Ulitzsch. Please advise   ---------------------------------------------------------------------------  --------------  CALL BACK INFO  What is the best way for the office to contact you? OK to leave message on   voicemail  Preferred Call Back Phone Number? [phone]  ---------------------------------------------------------------------------  --------------  SCRIPT ANSWERS  Relationship to Patient? Self

## 2019-07-17 NOTE — Telephone Encounter (Signed)
Is this something that you have discussed with pt during the visit.

## 2019-07-17 NOTE — Telephone Encounter (Signed)
I spoke with pt and relayed test results per NPTS . Pt verbalized understanding.

## 2019-07-17 NOTE — Telephone Encounter (Signed)
This is not a second request for me.  I have seen the patient once for a new patient appointment.  She was given a referral to rheumatology by cardiology already if that is what she is referencing to.  If she would also like a referral to pain management, I am happy to provide, though I might suggest waiting to see what rheumatology has to offer her    For a referral to pain management, she just needs to tell me where the source and where her pain is so I can put that on the referral.

## 2019-07-18 NOTE — Telephone Encounter (Signed)
No answer lvm with the detail msg.

## 2019-07-26 ENCOUNTER — Ambulatory Visit
Admit: 2019-07-26 | Discharge: 2019-07-26 | Payer: PRIVATE HEALTH INSURANCE | Attending: Internal Medicine | Primary: Family

## 2019-07-26 ENCOUNTER — Encounter

## 2019-07-26 DIAGNOSIS — M7918 Myalgia, other site: Secondary | ICD-10-CM

## 2019-07-26 NOTE — Progress Notes (Signed)
07/26/2019  Patient Name: Rachael Mays  DOB: 05-Feb-1956  MEDICAL RECORD [id number]    MEDICATIONS  Current Outpatient Medications   Medication Sig Dispense Refill   . gabapentin (NEURONTIN) 300 MG capsule Take 2 capsules by mouth nightly. 60 capsule 0   . albuterol sulfate HFA (PROVENTIL HFA) 108 (90 Base) MCG/ACT inhaler Inhale 2 puffs into the lungs every 6 hours as needed for Wheezing 3 Inhaler 3   . metoprolol (LOPRESSOR) 100 MG tablet Take 1 twice a day 270 tablet 1   . rosuvastatin (CRESTOR) 10 MG tablet TAKE ONE TABLET BY MOUTH DAILY 30 tablet 1   . lidocaine (LIDODERM) 5 % Place 1 patch onto the skin daily 12 hours on, 12 hours off. 30 patch 0   . nitroGLYCERIN (NITROSTAT) 0.4 MG SL tablet up to max of 3 total doses. If no relief after 1 dose, call 911. 25 tablet 3   . ranolazine (RANEXA) 500 MG extended release tablet TAKE ONE TABLET BY MOUTH TWICE A DAY 180 tablet 2   . prasugrel (EFFIENT) 10 MG TABS Take 1 tablet by mouth daily 90 tablet 3   . torsemide (DEMADEX) 20 MG tablet Take 1 tablet by mouth daily as needed (swelling) 30 tablet 1   . spironolactone (ALDACTONE) 25 MG tablet TAKE ONE AND ONE-HALF TABLETS BY MOUTH EVERY DAY (Patient taking differently: Take 25 mg by mouth daily One tab daily) 135 tablet 1   . omeprazole (PRILOSEC) 40 MG delayed release capsule TAKE 1 CAPSULE BY MOUTH ONE TIME A DAY 90 capsule 1   . lisinopril (PRINIVIL;ZESTRIL) 40 MG tablet TAKE 1 TABLET BY MOUTH ONE TIME A DAY 90 tablet 1   . montelukast (SINGULAIR) 10 MG tablet TAKE 1 TABLET BY MOUTH ONE TIME A DAY 90 tablet 1   . diclofenac sodium 1 % GEL Apply 2 g topically 2 times daily 1 Tube 3   . zoster recombinant adjuvanted vaccine (SHINGRIX) 50 MCG SUSR injection .29mL IM X 1 followed by .23mL IM 2-6 months after dose #1 0.5 mL 0   . aspirin 81 MG EC tablet Take 1 tablet by mouth daily 30 tablet 11   . triamcinolone (KENALOG) 0.1 % cream Apply topically 2 times daily.Apply with Q tip in each ear BID for 1 week. 15 g 1   .  mupirocin (BACTROBAN) 2 % ointment Apply topically  Daily.Apply with Q tip in each naris at night 15 g 0   . Multiple Vitamins-Minerals (MULTIVITAMIN PO) Take 1 tablet by mouth daily      . CALCIUM-VITAMIN D PO Take 1 tablet by mouth daily      . cetirizine (ZYRTEC ALLERGY) 10 MG tablet Take 10 mg by mouth daily.       No current facility-administered medications for this visit.        ALLERGIES  Allergies   Allergen Reactions   . Adhesive Tape      Local itching   . Seasonal    . Talwin [Pentazocine] Other (See Comments)     dizzy         Comments  No specialty comments available.    REFERRING PHYSICIAN:Jason Jens Som, APRN - CNP    HISTORY OF PRESENT ILLNESS  Rachael Mays is a 63 y.o. 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  She is complaining of pain in the mid back and shoulders for past several years which is progressively getting worse.  She has pain on daily basis.  She describes her pain as constant, 9 out of 10, aching, improved with rest and gets worse with activity.  She has difficulty doing overhead activities, reaching behind her back.    She also has a chronic low back pain and left knee pain.  She denies any swelling in the joints.  She has stiffness in the legs in the morning.  She has some difficulty opening doorknobs and jars.  She was seeing a pain specialist in the past for chronic low back pain.  She lost her insurance and was not able to afford him.  She was on Percocet which was helping significantly.  She now takes Tylenol as needed with minimal benefit.  She also takes gabapentin 600 mg at bedtime with minimal relief.  She is unable to take oral NSAIDs due to LAP-BAND surgery.  Her sister has rheumatoid  arthritis.  She denies any family history of lupus.  She has a history of blood clots.  She denies alopecia, photosensitivity, oral/nasal ulcers, dry eyes/dry mouth, history of miscarriages, kidney disease, pleurisy or pericarditis, Raynaud's, sclerodactyly, skin thickening.  She denies psoriasis, inflammatory bowel disease, inflammatory back pain, dactylitis, enthesitis, tenosynovitis or uveitis  She also has chronic fatigue.  She wakes up frequently at night due to pain.  Sleep is not refreshing.  She also has anxiety and depression.  She has occasional brain fog and memory changes.      HPI  Review of Systems    REVIEW OF SYSTEMS: Positive for fatigue, blood clots  Constitutional: No unanticipated weight loss or fevers.   Integumentary: No rash, photosensitivity, malar rash, livedo reticularis, alopecia and Raynaud's symptoms, sclerodactyly, skin tightening  Eyes: negative for visual disturbance and persistent redness, discharge from eyes   ENT: - No tinnitus, loss of hearing, vertigo, or recurrent ear infections.  - No history of nasal/oral ulcers.  - No history of dry eyes/dry mouth  Cardiovascular: No history of pericarditis, chest pain or murmur or palpitations  Respiratory: No shortness of breath, cough or history of interstitial lung disease.No history of pleurisy. No history of tuberculosis or atypical infections.  Gastrointestinal: No history of heart burn, dysphagia or esophageal dysmotility. No change in bowel habits or any inflammatory bowel disease.  Genitourinary: No history renal disease, miscarriages.  Hematologic/Lymphatic: No abnormal bruising or bleeding,  or swollen lymph nodes.  Neurological: No history of headaches, seizure or focal weakness. No history of neuropathies, paresthesias or hyperesthesias, facial droop, diplopia  Psychiatric: No history of bipolar disease  Endocrine: Denies any polyuria, polydipsia and osteoporosis  Allergic/Immunologic: No nasal congestion or hives.        I have  reviewed patients Past medical History, Social History and Family History as mentioned in her chart and this remains unchanged fromprevious.    Past Medical History:   Diagnosis Date   . Anxiety    . Asthma    . Blood circulation, collateral    . CAD (coronary artery disease)    . CHF (congestive heart failure) ([place])    . Chronic back pain    . Deep vein thrombosis    . Degeneration of thoracic intervertebral disc 10/15/2017   . Depression    . GERD (gastroesophageal reflux disease)     NO  LONGER SINCE LAP   . GERD (gastroesophageal reflux disease) 01/23/2009   . Hx of blood clots    . Hyperlipidemia     hx; resolved with lap band   . Hypertension    . Idiopathic peripheral neuropathy 03/26/2017   . Melanoma ([place])     upper back   . Obesity     hx of; had lap band   . Obstructive sleep apnea 09/18/2015     Updating Deprecated Diagnoses   . Obstructive sleep apnea (adult) (pediatric) 09/18/2015   . Pulmonary embolism [facility])      Past Surgical History:   Procedure Laterality Date   . BACK SURGERY      neck  plates   . BREAST SURGERY      left lumpectomy   . CESAREAN SECTION     . CHOLECYSTECTOMY     . COLONOSCOPY     . COLONOSCOPY  04/08/10   . CORONARY ANGIOPLASTY     . DIAGNOSTIC CARDIAC CATH LAB PROCEDURE     . DILATATION, ESOPHAGUS     . ENDOSCOPY, COLON, DIAGNOSTIC     . HYSTERECTOMY, VAGINAL     . LAP BAND  05/01/08    Dr. Nicole Cella   . OTHER SURGICAL HISTORY      [place] Filter: curently in place   . OTHER SURGICAL HISTORY  07/05/13    lap band removal   . SHOULDER SURGERY     . SKIN CANCER EXCISION  12/29/2017   . UPPER GASTROINTESTINAL ENDOSCOPY  05/19/13   . UPPER GASTROINTESTINAL ENDOSCOPY  07/21/13    ESOPHAGEAL STENT PLACEMENT   . UPPER GASTROINTESTINAL ENDOSCOPY N/A 07/31/2014    Esophagogastroduodenoscopy with esophageal balloon dilation   . VASCULAR SURGERY  04/2015    Lucendia Herrlich, celiac artery angiogram, normal abd arteries     Social History     Socioeconomic History   . Marital status: Divorced     Spouse name:  Immunologist   . Number of children: 2   . Years of education: 56   . Highest education level: Not on file   Occupational History   . Not on file   Social Needs   . Financial resource strain: Not on file   . Food insecurity     Worry: Not on file     Inability: Not on file   . Transportation needs     Medical: Not on file     Non-medical: Not on file   Tobacco Use   . Smoking status: Former Smoker     Packs/day: 0.50     Years: 40.00     Pack years: 20.00     Types: Cigarettes     Last attempt to quit: 08/07/2012     Years since quitting: 6.9   . Smokeless tobacco: Former Systems developer     Quit date: 06/16/2013   . Tobacco comment: started to smoke at age 62 / only smoked 0.5 p.p.d    Substance and Sexual Activity   . Alcohol use: No     Alcohol/week: 0.0 standard drinks   . Drug use: No   . Sexual activity: Not Currently   Lifestyle   . Physical activity     Days per week: Not on file     Minutes per session: Not on file   . Stress: Not on file   Relationships   . Social Product manager on phone: Not on file     Gets  together: Not on file     Attends religious service: Not on file     Active member of club or organization: Not on file     Attends meetings of clubs or organizations: Not on file     Relationship status: Not on file   . Intimate partner violence     Fear of current or ex partner: Not on file     Emotionally abused: Not on file     Physically abused: Not on file     Forced sexual activity: Not on file   Other Topics Concern   . Not on file   Social History Narrative    Works as Scientist, water quality at [employer] History   Problem Relation Age of Onset   . Arthritis Mother    . Cancer Mother    . Depression Mother    . High Blood Pressure Mother    . Heart Disease Mother 66        MI    . Ovarian Cancer Mother 20   . Cancer Father 25        colon   . Heart Disease Maternal Grandmother    . Early Death Paternal Grandmother    . Heart Disease Paternal Grandfather    . Diabetes Other    . High Blood Pressure Other    .  Obesity Other    . Other Sister         OSA   . Other Brother         OSA   . Other Brother         OSA   . Other Daughter         OSA         PHYSICAL EXAM   Vitals:    07/26/19 1423   BP: 110/62   Pulse: 61   Temp: 97.2 F (36.2 C)   Weight: (!) 309 lb 3.2 oz (140.3 kg)   Height: 5\' 5"  (1.651 m)     Physical Exam  Constitutional:  Well developed, well nourished, no acute distress, non-toxic appearance   Musculoskeletal:    RIGHT  Swell  Tender  ROM  LEFT  Swell  Tender  ROM    DIP2  0  0   [place]  0  0   [place]   DIP3  0  0   [place]  0  0   [place]   DIP4  0  0  FULL   0  0  FULL    DIP5  0  0  FULL   0  0  FULL    PIP1  0  0  FULL   0  0  FULL    PIP2  0  0  FULL   0  0  FULL    PIP3  0  0  FULL   0  0  FULL    PIP4  0  0  FULL   0  0  FULL    PIP5  0  0  FULL   0  0  FULL    MCP1  0  0  FULL   0  0  FULL    MCP2  0  0  FULL   0  0  FULL    MCP3  0  0  FULL   0  0  FULL    MCP4  0  0  FULL   0  0  FULL    MCP5  0  0  FULL   0  0  FULL    Wrist  0  0  FULL   0  0  FULL    Elbow  0  0  FULL   0  0  FULL    Shouldr  0  0  FULL   0  0  FULL    Hip  0  0  FULL   0  0  FULL    Knee  0  0  FULL   0  0  FULL    Ankle  0  0  FULL   0  0  FULL    MTP1  0  0  FULL   0  0  FULL    MTP2  0  0  FULL   0  0  FULL    MTP3  0  0  FULL   0  0  FULL    MTP4  0  0  FULL   0  0  FULL    MTP5  0  0  FULL   0  0  FULL    IP1  0  0  FULL   0  0  FULL    IP2  0  0  FULL   0  0  FULL    IP3  0  0  FULL   0  0  FULL    IP4  0  0  FULL   0  0  FULL    IP5  0  0  FULL   0 0 FULL                                             Right            Left                                   Tender Tender    Costochondral  ++ +   Low cervical + +   suboccipital + +   Trapezius  ++ ++   Supraspinatus  + +   Lateral epicondyl + +   Gluteal + +   Greater trochanter  ++ ++   knees + +     Tenderness to palpation along thoracic spinal and paraspinal area and lumbar spinal and paraspinal area  Tenderness to palpation along the trapezius and shoulder  blade  Tenderness to palpation bilateral CMC joints  Ambulates without assistance, normal gait  Neck: Full ROM, no tenderness,supple   Back-  Tendernes++  Eyes:  PERRL, extra ocular movements intact, conjunctiva normal   HEENT:  Atraumatic, normocephalic, external ears normal, oropharynx moist, no pharyngeal exudates.   Respiratory:  No respiratory distress  GI:  Soft, nondistended, normal bowel sounds, nontender, noorganomegaly, no mass, no rebound, no guarding   GU:  No costovertebral angle tenderness   Integument:  Well hydrated, no rash or telangiectasias  Lymphatic:  No lymphadenopathy noted   Neurologic:   Alert & oriented x 3, CN 2-12 normal, no focal deficits noted. Sensations Intact. Muscle strength 5/5 proximallyand distally in upper and lower extremities.  Psychiatric:  Speech and behavior appropriate           LABS AND IMAGING  Outside data reviewed and in HPI    Lab Results   Component Value Date    WBC 7.0 07/13/2019    RBC 4.45 07/13/2019    HGB 13.4 07/13/2019    HCT 40.2 07/13/2019    PLT 153 07/13/2019    MCV 90.3 07/13/2019    MCH 30.2 07/13/2019    MCHC 33.5 07/13/2019    RDW 14.1 07/13/2019    SEGSPCT 50.1 05/15/2011    LYMPHOPCT 35.1 07/13/2019    MONOPCT 9.6 07/13/2019    EOSPCT 3.7 03/31/2011    BASOPCT 0.9 07/13/2019    MONOSABS 0.7 07/13/2019    LYMPHSABS 2.5 07/13/2019    EOSABS 0.3 07/13/2019    BASOSABS 0.1 07/13/2019    DIFFTYPE Auto 05/15/2011       Chemistry        Component Value Date/Time    NA 141 07/13/2019 1518    K 4.4 07/13/2019 1518    K 3.9 05/05/2017 0321    CL 105 07/13/2019 1518    CO2 26 07/13/2019 1518    BUN 16 07/13/2019 1518    CREATININE 0.8 07/13/2019 1518        Component Value Date/Time    CALCIUM 9.5 07/13/2019 1518    ALKPHOS 79 07/13/2019 1518    AST 23 07/13/2019 1518    ALT 24 07/13/2019 1518    BILITOT 0.6 07/13/2019 1518          Lab Results   Component Value Date    SEDRATE 23 12/25/2016     Lab Results   Component Value Date    CRP 5.2 (H) 12/25/2016      Lab Results   Component Value Date    ANA Negative 12/25/2016     No results found for: RF, CCPABIGG  Lab Results   Component Value Date    ANA Negative 12/25/2016    ANAINT see below 12/25/2016     No results found for: DSDNAG, DSDNAIGGIFA  No results found for: SSAROAB, SSALAAB  No results found for: SMAB, RNPAB  No results found for: CENTABIGG  No results found for: C3, C4, ACE  Lab Results   Component Value Date    VITD25 39 03/26/2010     No results found for: Thayer Ohm  Lab Results   Component Value Date    VITAMINB12 529 03/29/2017     Lab Results   Component Value Date    TSHFT4 1.24 03/29/2017    TSH 1.37 08/22/2015     Lab Results   Component Value Date    VITD25 39 03/26/2010       ASSESSMENT AND PLAN      Assessment/Plan:      ASSESSMENT:    1. Myofascial pain    2. Chronic pain syndrome        PLAN:     Tiffini was seen today for establish care.    Diagnoses and all orders for this visit:    Myofascial pain  -     CBC Auto Differential; Future  -     Comprehensive Metabolic Panel; Future  -     C-Reactive Protein; Future  -     Sedimentation Rate; Future  -     Rheumatoid Factor; Future  -     Cyclic Citrul Peptide Antibody, IgG; Future  -     Hepatitis  B Core Antibody, IgM; Future  -     Hepatitis B Surface Antigen; Future  -     Hepatitis C Antibody; Future  -     ANA Reflex to Antibody Cascade; Future  -     CK; Future  -     Vitamin B12; Future  -     Vitamin D 25 Hydroxy; Future  -     TSH WITH REFLEX TO FT4; Future    Chronic pain syndrome       Most likely myofascial pain/chronic pain syndrome.  Poor sleep, cognitive impairment, anxiety/depression  No active synovitis on joint exam  History and physical examination not suggestive of systemic rheumatic disease  I will do work-up to completely rule out any underlying systemic rheumatic disease  I will also do work-up to completely rule out any metabolic or myopathy etiology  Depending on the results I will arrange a follow-up  appointment.  If work-up is unremarkable I would recommend referral to a pain specialist for further management of chronic pain/myofascial pain    The patient indicates understanding of these issues and agrees with the plan.      Return if symptoms worsen or fail to improve.      The risks and benefits of my recommendations, as well as other treatment options, benefits and side effects werediscussed with the patient. All questions were answered.    I reviewed patient's history, referral documents and electronic medical records  Copy of consult note is being routedelectronically/faxed to referring physician         ######################################################################    I thank you for giving me theopportunity to participate in [name] care. If you have any questions or concerns please feel free to contact me. I look forward to following  Olin Hauser along with you.      Electronically signed by: Oletta Lamas, MD, 07/26/2019 2:58 PM    Documentation was done using voice recognition dragon software.  Every effort was made to ensure accuracy;however, inadvertent unintentional computerized transcription errors may be present.

## 2019-07-26 NOTE — Patient Instructions (Signed)
Labs

## 2019-07-27 ENCOUNTER — Ambulatory Visit
Admit: 2019-07-27 | Discharge: 2019-07-27 | Payer: PRIVATE HEALTH INSURANCE | Attending: Plastic Surgery within the Head & Neck | Primary: Family

## 2019-07-27 ENCOUNTER — Encounter

## 2019-07-27 DIAGNOSIS — H60312 Diffuse otitis externa, left ear: Secondary | ICD-10-CM

## 2019-07-27 LAB — COMPREHENSIVE METABOLIC PANEL
ALT: 30 U/L (ref 10–40)
AST: 30 U/L (ref 15–37)
Albumin/Globulin Ratio: 1.4 (ref 1.1–2.2)
Albumin: 4.4 g/dL (ref 3.4–5.0)
Alkaline Phosphatase: 87 U/L (ref 40–129)
Anion Gap: 13 (ref 3–16)
BUN: 19 mg/dL (ref 7–20)
CO2: 26 mmol/L (ref 21–32)
Calcium: 9.9 mg/dL (ref 8.3–10.6)
Chloride: 103 mmol/L (ref 99–110)
Creatinine: 0.9 mg/dL (ref 0.6–1.2)
GFR African American: >60 (ref >60)
GFR Non-African American: >60 (ref >60)
Globulin: 3.1 g/dL
Glucose: 121 mg/dL — ABNORMAL HIGH (ref 70–99)
Potassium: 4.5 mmol/L (ref 3.5–5.1)
Sodium: 142 mmol/L (ref 136–145)
Total Bilirubin: 0.4 mg/dL (ref 0.0–1.0)
Total Protein: 7.5 g/dL (ref 6.4–8.2)

## 2019-07-27 LAB — HEPATITIS C ANTIBODY: Hep C Ab Interp: NONREACTIVE

## 2019-07-27 LAB — CBC WITH AUTO DIFFERENTIAL
Basophils %: 0.6 %
Basophils Absolute: 0 10*3/uL (ref 0.0–0.2)
Eosinophils %: 4.3 %
Eosinophils Absolute: 0.3 10*3/uL (ref 0.0–0.6)
Hematocrit: 43.9 % (ref 36.0–48.0)
Hemoglobin: 14.4 g/dL (ref 12.0–16.0)
Lymphocytes %: 38.4 %
Lymphocytes Absolute: 2.7 10*3/uL (ref 1.0–5.1)
MCH: 29.7 pg (ref 26.0–34.0)
MCHC: 32.8 g/dL (ref 31.0–36.0)
MCV: 90.4 fL (ref 80.0–100.0)
MPV: 9.8 fL (ref 5.0–10.5)
Monocytes %: 11.8 %
Monocytes Absolute: 0.8 10*3/uL (ref 0.0–1.3)
Neutrophils %: 44.9 %
Neutrophils Absolute: 3.2 10*3/uL (ref 1.7–7.7)
Platelets: 186 10*3/uL (ref 135–450)
RBC: 4.86 M/uL (ref 4.00–5.20)
RDW: 14.3 % (ref 12.4–15.4)
WBC: 7.1 10*3/uL (ref 4.0–11.0)

## 2019-07-27 LAB — ANA REFLEX TO ANTIBODY CASCADE: ANA: NEGATIVE

## 2019-07-27 LAB — TSH WITH REFLEX TO FT4: TSH Reflex FT4: 1.69 u[IU]/mL (ref 0.27–4.20)

## 2019-07-27 LAB — VITAMIN B12: Vitamin B-12: 788 pg/mL (ref 211–911)

## 2019-07-27 LAB — HEPATITIS B CORE ANTIBODY, IGM: Hep B Core Ab, IgM: NONREACTIVE

## 2019-07-27 LAB — C-REACTIVE PROTEIN: CRP: 4.1 mg/L (ref 0.0–5.1)

## 2019-07-27 LAB — HEPATITIS B SURFACE ANTIGEN: Hep B S Ag Interp: NONREACTIVE

## 2019-07-27 LAB — CK: Total CK: 181 U/L (ref 26–192)

## 2019-07-27 LAB — VITAMIN D 25 HYDROXY: Vit D, 25-Hydroxy: 28.7 ng/mL — ABNORMAL LOW (ref >=30)

## 2019-07-27 LAB — RHEUMATOID FACTOR: Rheumatoid Factor: 14 IU/mL — ABNORMAL HIGH (ref <14)

## 2019-07-27 LAB — CYCLIC CITRUL PEPTIDE ANTIBODY, IGG: CC Peptide,IgG Ab: 0.6 U/mL (ref 0.0–2.9)

## 2019-07-27 LAB — SEDIMENTATION RATE: Sed Rate: 13 mm/Hr (ref 0–30)

## 2019-07-27 MED ORDER — HYDROCORTISONE-ACETIC ACID 1-2 % OT SOLN
1-2 % | OTIC | 1 refills | Status: DC
Start: 2019-07-27 — End: 2022-04-13

## 2019-07-27 MED ORDER — HYDROCODONE-ACETAMINOPHEN 5-325 MG PO TABS
5-325 MG | ORAL_TABLET | Freq: Four times a day (QID) | ORAL | 0 refills | Status: AC | PRN
Start: 2019-07-27 — End: 2019-08-03

## 2019-07-27 MED ORDER — METHYLPREDNISOLONE 4 MG PO TBPK
4 MG | PACK | ORAL | 0 refills | Status: DC
Start: 2019-07-27 — End: 2019-08-10

## 2019-07-27 MED ORDER — VITAMIN D 50 MCG (2000 UT) PO TABS
50 MCG (2000 UT) | ORAL_TABLET | Freq: Every day | ORAL | 11 refills | Status: DC
Start: 2019-07-27 — End: 2019-09-01

## 2019-07-27 MED ORDER — VITAMIN D (ERGOCALCIFEROL) 1.25 MG (50000 UT) PO CAPS
1.25 MG (50000 UT) | ORAL_CAPSULE | ORAL | 2 refills | Status: DC
Start: 2019-07-27 — End: 2019-09-01

## 2019-07-27 MED ORDER — CIPROFLOXACIN HCL 500 MG PO TABS
500 MG | ORAL_TABLET | Freq: Two times a day (BID) | ORAL | 0 refills | Status: DC
Start: 2019-07-27 — End: 2019-08-09

## 2019-07-27 NOTE — Progress Notes (Signed)
Over the course of the past 3 days the patient has noted a marked increase in pain and discomfort in her left ear.  It has swollen to the point where it is difficult to hear.  She is also having some discomfort in the right ear but that has been a more chronic problem that normally would be taken care of with her previous drops.  She has had no fever.  There is been no tinnitus nor vertigo.  She does not smoke.  Currently, she appears in no obvious acute distress.  Ear examination on the left reveals marked inflammation of the external canal with it markedly swollen almost shut.  It is cleaned.  The tympanic membrane appears normal.  A wick is inserted.  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  This was painted with gentian violet.  After that had been cleaned with peroxide first.  Oral examination is unremarkable.  The nasal mucosa was normal as well the neck is free of any adenopathy, mass, thyroid enlargement and there is no mastoid tenderness present.  We will start her on Acetasol HC otic drops to the left ear as well as Cipro orally and a Medrol Dosepak.  Norco as can be given for pain and I will see her again on Monday.

## 2019-07-27 NOTE — Other (Signed)
Patient is deficient in vitamin D. Will start VITAMIN D2 50,000 IU once a week for 12 weeks and then vitamin D3 2000 IU once a day daily   Also she has slightly elevated rheumatoid factor about 15% of normal population have elevated rheumatoid factor without any evidence of rheumatoid arthritis.  I doubt she has rheumatoid arthritis rest of the work-up is negative.  She should continue follow-up with PCP.  Would also recommend referral to a pain specialist for further management of pain

## 2019-07-28 ENCOUNTER — Encounter

## 2019-07-28 MED ORDER — CIPRODEX 0.3-0.1 % OT SUSP
Freq: Two times a day (BID) | OTIC | 1 refills | Status: AC
Start: 2019-07-28 — End: 2019-08-04

## 2019-07-28 NOTE — Telephone Encounter (Signed)
Pt called to report ear wick fell out last night.  Should pt come in for another or just keep appt on Monday?.  Please advise.   pam [phone]

## 2019-07-28 NOTE — Telephone Encounter (Signed)
Patient calling back, she says that she did not get her ear drops rx yesterday     She is calling the pharmacy after we hang up, to check on the rx    [employer]

## 2019-07-28 NOTE — Telephone Encounter (Signed)
Spoke to pt she states she has degenerative disc disease, she did labs for Dr. Deatra Canter and came back negative.

## 2019-07-28 NOTE — Telephone Encounter (Signed)
-----   Message from [facility] sent at 07/28/2019  1:56 PM EDT -----  Subject: Message to Provider    QUESTIONS  Information for Provider? patient needs a referral to a pain management   specialist and would like a call back as soon as possible   ---------------------------------------------------------------------------  --------------  [place]  What is the best way for the office to contact you? OK to leave message on   voicemail  Preferred Call Back Phone Number? [phone]  ---------------------------------------------------------------------------  --------------  SCRIPT ANSWERS  Relationship to Patient? Self

## 2019-07-28 NOTE — Telephone Encounter (Signed)
No reason for referral in message.  I have to provide a diagnosis for a referral so more information is needed

## 2019-07-28 NOTE — Telephone Encounter (Signed)
Patient calling back ,  [facility] says they are out of stock on the ear drops for patient    And they said that they called the office at 12:50 to let us know that, please advise     Dr [name] do you want to order something different for the patient, please advise    [employer]

## 2019-07-28 NOTE — Telephone Encounter (Signed)
Needs different drops

## 2019-07-28 NOTE — Telephone Encounter (Signed)
[phone] (home)   Call to Pt, new Rx at pharmacy

## 2019-07-28 NOTE — Telephone Encounter (Signed)
-----   Message from [facility] sent at 07/28/2019  1:56 PM EDT -----  Subject: Message to Provider    QUESTIONS  Information for Provider? patient needs a referral to a pain management   specialist and would like a call back as soon as possible   ---------------------------------------------------------------------------  --------------  [place]  What is the best way for the office to contact you? OK to leave message on   voicemail  Preferred Call Back Phone Number? [phone]  ---------------------------------------------------------------------------  --------------  SCRIPT ANSWERS  Relationship to Patient? Self

## 2019-07-28 NOTE — Telephone Encounter (Signed)
[phone] (home)   LMOM, pt to cont drops and f/u via phone on Monday.  Mondays in office appt canceled

## 2019-07-28 NOTE — Telephone Encounter (Signed)
Actually, just continue the drops and give [place] a call on Monday.  She may not have to come in on Monday so cancel that appointment.

## 2019-07-31 ENCOUNTER — Encounter: Attending: Plastic Surgery within the Head & Neck | Primary: Family

## 2019-07-31 ENCOUNTER — Encounter

## 2019-07-31 NOTE — Telephone Encounter (Signed)
Pt informed through vm.

## 2019-07-31 NOTE — Telephone Encounter (Signed)
Referral placed.

## 2019-08-01 NOTE — Telephone Encounter (Signed)
Pt called spoke with the call center states was referred to Dr Diana Eves but due to billing issues she would like a referral to another pain management physician.

## 2019-08-01 NOTE — Telephone Encounter (Signed)
Gave number for  Dr. Kari Baars and also advised her to call her insurance and check who they cover.

## 2019-08-01 NOTE — Telephone Encounter (Signed)
The referral will work for any pain management provider.

## 2019-08-08 ENCOUNTER — Emergency Department: Admit: 2019-08-09 | Payer: PRIVATE HEALTH INSURANCE | Primary: Family

## 2019-08-08 ENCOUNTER — Inpatient Hospital Stay
Admission: EM | Admit: 2019-08-09 | Discharge: 2019-08-10 | Disposition: A | Discharge status: Home / Self Care | Payer: PRIVATE HEALTH INSURANCE | Source: Other Acute Inpatient Hospital | Admitting: Internal Medicine

## 2019-08-08 DIAGNOSIS — I25110 Atherosclerotic heart disease of native coronary artery with unstable angina pectoris: Secondary | ICD-10-CM

## 2019-08-08 NOTE — ED Provider Notes (Signed)
CHIEF COMPLAINT  Chest Pain (Pt presents to the ED with CP in the center of chest that began yesterday while getting ready for work. Pt had stent placed in 2019, pt states she got nervous when she started to N/D this evening with increased CP and SOB this afternoon)      HISTORY OF PRESENT ILLNESS  Rachael Mays is a 63 y.o. female who presents to the ED with report of chest pain in the center of her chest of the left side of her chest started this morning while she was getting ready for work.  She had a stent that was placed in 2019 by Dr. Berna Bue and reportedly less than a year prior to that she had had a normal cath.  Patient did speak with Dr. Aletha Halim prior to arrival who told her to come to the ER and likely need to be admitted.  He does report some nausea no headache or dizziness or LOC or any back pain or abdominal pain.  No fevers or chills or cough or shortness of breath.  Nothing she does makes the pain any better or worse.  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  York Spaniel this was secondary to birth control at that time.  No other complaints, modifying factors or associated symptoms.     I have reviewed the following from the nursing documentation.    Past Medical History:   Diagnosis Date   . Anxiety    . Asthma    . Blood circulation, collateral    . CAD (coronary artery disease)    . CHF (congestive heart failure) (HCC)    . Chronic back pain    . Deep vein thrombosis    . Degeneration of thoracic intervertebral disc 10/15/2017   . Depression    . GERD (gastroesophageal reflux disease)     NO LONGER SINCE LAP   . GERD (gastroesophageal reflux disease) 01/23/2009   . Hx of blood clots    . Hyperlipidemia     hx; resolved with lap band   . Hypertension    . Idiopathic peripheral neuropathy 03/26/2017   . Melanoma (HCC)     upper back   . Obesity     hx of; had lap band   . Obstructive sleep apnea 09/18/2015     Updating Deprecated Diagnoses   . Obstructive sleep apnea (adult) (pediatric) 09/18/2015   .  Pulmonary embolism [facility])      Past Surgical History:   Procedure Laterality Date   . BACK SURGERY      neck  plates   . BREAST SURGERY      left lumpectomy   . CESAREAN SECTION     . CHOLECYSTECTOMY     . COLONOSCOPY     . COLONOSCOPY  04/08/10   . CORONARY ANGIOPLASTY     . DIAGNOSTIC CARDIAC CATH LAB PROCEDURE     . DILATATION, ESOPHAGUS     . ENDOSCOPY, COLON, DIAGNOSTIC     . HYSTERECTOMY, VAGINAL     . LAP BAND  05/01/08    Dr. Suzi Roots   . OTHER SURGICAL HISTORY      [place] Filter: curently in place   . OTHER SURGICAL HISTORY  07/05/13    lap band removal   . SHOULDER SURGERY     . SKIN CANCER EXCISION  12/29/2017   . UPPER GASTROINTESTINAL ENDOSCOPY  05/19/13   . UPPER GASTROINTESTINAL ENDOSCOPY  07/21/13    ESOPHAGEAL STENT  PLACEMENT   . UPPER GASTROINTESTINAL ENDOSCOPY N/A 07/31/2014    Esophagogastroduodenoscopy with esophageal balloon dilation   . VASCULAR SURGERY  04/2015    Donzetta Sprung, celiac artery angiogram, normal abd arteries     Family History   Problem Relation Age of Onset   . Arthritis Mother    . Cancer Mother    . Depression Mother    . High Blood Pressure Mother    . Heart Disease Mother 68        MI    . Ovarian Cancer Mother 32   . Cancer Father 22        colon   . Heart Disease Maternal Grandmother    . Early Death Paternal Grandmother    . Heart Disease Paternal Grandfather    . Diabetes Other    . High Blood Pressure Other    . Obesity Other    . Other Sister         OSA   . Other Brother         OSA   . Other Brother         OSA   . Other Daughter         OSA     Social History     Socioeconomic History   . Marital status: Divorced     Spouse name: Writer   . Number of children: 2   . Years of education: 32   . Highest education level: Not on file   Occupational History   . Not on file   Social Needs   . Financial resource strain: Not on file   . Food insecurity     Worry: Not on file     Inability: Not on file   . Transportation needs     Medical: Not on file     Non-medical: Not on file    Tobacco Use   . Smoking status: Former Smoker     Packs/day: 0.50     Years: 40.00     Pack years: 20.00     Types: Cigarettes     Last attempt to quit: 08/07/2012     Years since quitting: 7.0   . Smokeless tobacco: Former Neurosurgeon     Quit date: 06/16/2013   . Tobacco comment: started to smoke at age 73 / only smoked 0.5 p.p.d    Substance and Sexual Activity   . Alcohol use: No     Alcohol/week: 0.0 standard drinks   . Drug use: No   . Sexual activity: Not Currently   Lifestyle   . Physical activity     Days per week: Not on file     Minutes per session: Not on file   . Stress: Not on file   Relationships   . Social Wellsite geologist on phone: Not on file     Gets together: Not on file     Attends religious service: Not on file     Active member of club or organization: Not on file     Attends meetings of clubs or organizations: Not on file     Relationship status: Not on file   . Intimate partner violence     Fear of current or ex partner: Not on file     Emotionally abused: Not on file     Physically abused: Not on file     Forced sexual activity: Not on file   Other Topics Concern   .  Not on file   Social History Narrative    Works as Conservation officer, nature at [employer]          Current Facility-Administered Medications   Medication Dose Route Frequency Provider Last Rate Last Dose   . 0.9 % sodium chloride bolus  1,000 mL Intravenous Once Lissa Merlin V, DO       . ondansetron [facility]) injection 4 mg  4 mg Intravenous Once Lissa Merlin V, DO       . morphine injection 4 mg  4 mg Intravenous Once Denzil Magnuson, DO         Current Outpatient Medications   Medication Sig Dispense Refill   . acetic acid-hydrocortisone (VOSOL-HC) 1-2 % otic solution 4 drops in affected ear or ears every 3 hours while awake 15 mL 1   . methylPREDNISolone (MEDROL, PAK,) 4 MG tablet Take 1 tablet by mouth See Admin Instructions Take by mouth. 1 kit 0   . ciprofloxacin (CIPRO) 500 MG tablet Take 1 tablet by mouth 2 times daily 10 tablet 0   .  gabapentin (NEURONTIN) 300 MG capsule Take 2 capsules by mouth nightly. 60 capsule 0   . albuterol sulfate HFA (PROVENTIL HFA) 108 (90 Base) MCG/ACT inhaler Inhale 2 puffs into the lungs every 6 hours as needed for Wheezing 3 Inhaler 3   . metoprolol (LOPRESSOR) 100 MG tablet Take 1 twice a day 270 tablet 1   . lidocaine (LIDODERM) 5 % Place 1 patch onto the skin daily 12 hours on, 12 hours off. 30 patch 0   . nitroGLYCERIN (NITROSTAT) 0.4 MG SL tablet up to max of 3 total doses. If no relief after 1 dose, call 911. 25 tablet 3   . omeprazole (PRILOSEC) 40 MG delayed release capsule TAKE 1 CAPSULE BY MOUTH ONE TIME A DAY 90 capsule 1   . lisinopril (PRINIVIL;ZESTRIL) 40 MG tablet TAKE 1 TABLET BY MOUTH ONE TIME A DAY 90 tablet 1   . montelukast (SINGULAIR) 10 MG tablet TAKE 1 TABLET BY MOUTH ONE TIME A DAY 90 tablet 1   . diclofenac sodium 1 % GEL Apply 2 g topically 2 times daily 1 Tube 3   . aspirin 81 MG EC tablet Take 1 tablet by mouth daily 30 tablet 11   . mupirocin (BACTROBAN) 2 % ointment Apply topically  Daily.Apply with Q tip in each naris at night 15 g 0   . Multiple Vitamins-Minerals (MULTIVITAMIN PO) Take 1 tablet by mouth daily      . CALCIUM-VITAMIN D PO Take 1 tablet by mouth daily      . cetirizine (ZYRTEC ALLERGY) 10 MG tablet Take 10 mg by mouth daily.     . vitamin D (CHOLECALCIFEROL) 50 MCG (2000 UT) TABS tablet Take 1 tablet by mouth daily After finishing 12 week couse 30 tablet 11   . vitamin D (ERGOCALCIFEROL) 1.25 MG (50000 UT) CAPS capsule Take 1 capsule by mouth once a week 4 capsule 2   . rosuvastatin (CRESTOR) 10 MG tablet TAKE ONE TABLET BY MOUTH DAILY 30 tablet 1   . ranolazine (RANEXA) 500 MG extended release tablet TAKE ONE TABLET BY MOUTH TWICE A DAY 180 tablet 2   . prasugrel (EFFIENT) 10 MG TABS Take 1 tablet by mouth daily 90 tablet 3   . torsemide (DEMADEX) 20 MG tablet Take 1 tablet by mouth daily as needed (swelling) 30 tablet 1   . spironolactone (ALDACTONE) 25 MG tablet TAKE  ONE AND ONE-HALF TABLETS  BY MOUTH EVERY DAY (Patient taking differently: Take 25 mg by mouth daily One tab daily) 135 tablet 1   . zoster recombinant adjuvanted vaccine (SHINGRIX) 50 MCG SUSR injection .5mL IM X 1 followed by .5mL IM 2-6 months after dose #1 0.5 mL 0   . triamcinolone (KENALOG) 0.1 % cream Apply topically 2 times daily.Apply with Q tip in each ear BID for 1 week. 15 g 1     Allergies   Allergen Reactions   . Adhesive Tape      Local itching   . Seasonal    . Talwin [Pentazocine] Other (See Comments)     dizzy       REVIEW OF SYSTEMS  10 systems reviewed, pertinent positives per HPI otherwise noted to be negative.    PHYSICAL EXAM  BP 136/76   Pulse 63   Temp 98.2 F (36.8 C) (Oral)   Resp 18   Ht 5\' 5"  (1.651 m)   Wt 300 lb (136.1 kg)   SpO2 97%   BMI 49.92 kg/m   GENERAL APPEARANCE: Awake and alert. Cooperative.  Appears very uncomfortable.  HEAD: Normocephalic. Atraumatic.  EYES: PERRL. EOM's grossly intact.   ENT: Mucous membranes are moist.   NECK: Supple.   HEART: RRR.   CHEST/LUNGS: Chest atraumatic, nontender, respirations unlabored. CTAB. Good air exchange. Speaking comfortably in full sentences.   BACK: No midline spinal tenderness or step-off.   ABDOMEN: Soft. Non-distended. Non-tender. No guarding or rebound. Normal bowel sounds.  EXTREMITIES: No peripheral edema. Moves all extremities equally. All extremities neurovascularly intact.   RECTAL/GU: Deferred  SKIN: Warm and dry. No acute rashes.   NEUROLOGICAL: Alert and oriented. CN 2-12 intact, No gross facial drooping. Strength 5/5, sensation intact. Normal coordination. Gait normal.   PSYCHIATRIC: Normal mood and affect.    LABS  I have reviewed all labs for this visit.   Results for orders placed or performed during the hospital encounter of 08/08/19   Basic Metabolic Panel   Result Value Ref Range    Sodium 138 136 - 145 mmol/L    Potassium 4.5 3.5 - 5.1 mmol/L    Chloride 103 99 - 110 mmol/L    CO2 23 21 - 32 mmol/L    Anion  Gap 12 3 - 16    Glucose 136 (H) 70 - 99 mg/dL    BUN 18 7 - 20 mg/dL    CREATININE 1.0 0.6 - 1.2 mg/dL    GFR Non-African American 56 (A) >60    GFR African American >60 >60    Calcium 9.5 8.3 - 10.6 mg/dL   Troponin   Result Value Ref Range    Troponin <0.01 <0.01 ng/mL   CBC Auto Differential   Result Value Ref Range    WBC 7.0 4.0 - 11.0 K/uL    RBC 4.72 4.00 - 5.20 M/uL    Hemoglobin 14.2 12.0 - 16.0 g/dL    Hematocrit 16.1 09.6 - 48.0 %    MCV 89.7 80.0 - 100.0 fL    MCH 30.1 26.0 - 34.0 pg    MCHC 33.5 31.0 - 36.0 g/dL    RDW 04.5 40.9 - 81.1 %    Platelets 157 135 - 450 K/uL    MPV 9.1 5.0 - 10.5 fL    Neutrophils % 52.1 %    Lymphocytes % 33.2 %    Monocytes % 11.0 %    Eosinophils % 3.1 %    Basophils % 0.6 %  Neutrophils Absolute 3.6 1.7 - 7.7 K/uL    Lymphocytes Absolute 2.3 1.0 - 5.1 K/uL    Monocytes Absolute 0.8 0.0 - 1.3 K/uL    Eosinophils Absolute 0.2 0.0 - 0.6 K/uL    Basophils Absolute 0.0 0.0 - 0.2 K/uL   EKG 12 Lead   Result Value Ref Range    Ventricular Rate 64 BPM    Atrial Rate 64 BPM    P-R Interval 136 ms    QRS Duration 72 ms    Q-T Interval 414 ms    QTc Calculation (Bazett) 427 ms    P Axis 40 degrees    R Axis -8 degrees    T Axis 29 degrees    Diagnosis Normal sinus rhythmNormal ECG        EKG  EKG interpreted by me.  Normal sinus rhythm heart rate 64, normal intervals and QRS,No significant ST elevation or depression.       RADIOLOGY  X-RAYS:  I have reviewed radiologic plain film image(s).  ALL OTHER NON-PLAIN FILM IMAGES SUCH AS CT, ULTRASOUND AND MRI HAVE BEEN READ BY THE RADIOLOGIST.  XR CHEST (2 VW)   Final Result   No evidence of acute cardiopulmonary disease         CT CHEST PULMONARY EMBOLISM W CONTRAST    (Results Pending)          ED COURSE/MDM  Patient seen and evaluated.  Patient had intervention by Dr. Berna Bue a couple years ago and had a clean cath and then less than 1 year later she required 2 stents for significantly advanced coronary artery disease.  She had  spoken with cardiologist Dr. Aletha Halim prior to coming in here and he also notified the ED that the patient would be coming in and did say that is likely the patient is going to need to be admitted for further evaluation and testing considering her concerning history and her symptoms that are similar to prior.  Reportedly she had a normal work-up prior when she had the stents as well.  CT scan was performed here to rule out PE as the patient does have a history of PEs in the 1980s.      All diagnostic tests reviewed and results discussed with patient.   Plan of care discussed with patient. Patient in agreement with plan.     New Prescriptions    No medications on file       CLINICAL IMPRESSION  1. Chest pain with high risk for cardiac etiology        Blood pressure 136/76, pulse 63, temperature 98.2 F (36.8 C), temperature source Oral, resp. rate 18, height 5\' 5"  (1.651 m), weight 300 lb (136.1 kg), SpO2 97 %, not currently breastfeeding.    DISPOSITION  Rachael Mays was admitted in stable condition.     This chart was generated in part by using Dragon Dictation system and may contain errors related to that system including errors in grammar, punctuation, and spelling, as well as words and phrases that may be inappropriate. When dictating, effort is made to correct spelling/grammar errors. If there are any questions or concerns please feel free to contact the dictating provider for clarification.     Denzil Magnuson, DO  ATTENDING, EMERGENCY MEDICINE        Denzil Magnuson, DO  08/15/19 Corky Crafts

## 2019-08-08 NOTE — Telephone Encounter (Signed)
Noted  

## 2019-08-08 NOTE — Telephone Encounter (Signed)
Spoke to patient and she is feeling sick to her stomach and has an ache in her chest behind her left breast since yesterday. She had a sweating episode yesterday. She has a heaviness in her chest this morning. She has had that feeling all day has not gotten better or worse. Patient is feeling SOB. Bp is 128/68 heart rate 63, having some fluttering and feeling anxious/gittery. Legs feel very weak, very tired. Patient is asking if she should go to the ER.  Please advise

## 2019-08-08 NOTE — Telephone Encounter (Signed)
Pt calling in feeling light headed, dizzy and shortness of breath. Very fatigue and legs are weak. This started yesterday. Pt BP is 128/68 and HR is 63 pt wants to know what she should do? Pt has an appt on 08/11/19 with NPKA

## 2019-08-08 NOTE — ED Provider Notes (Signed)
EKG reviewed myself.  Dated today 1918.  Rate 64 normal sinus rhythm normal EKG no change compared to 04 May 2017.     Catha Nottingham, MD  08/08/19 [phone]

## 2019-08-08 NOTE — Telephone Encounter (Signed)
Advised her to go to ER

## 2019-08-09 ENCOUNTER — Emergency Department: Admit: 2019-08-09 | Payer: PRIVATE HEALTH INSURANCE | Primary: Family

## 2019-08-09 LAB — BASIC METABOLIC PANEL
Anion Gap: 12 (ref 3–16)
BUN: 18 mg/dL (ref 7–20)
CO2: 23 mmol/L (ref 21–32)
Calcium: 9.5 mg/dL (ref 8.3–10.6)
Chloride: 103 mmol/L (ref 99–110)
Creatinine: 1 mg/dL (ref 0.6–1.2)
GFR African American: >60 (ref >60)
GFR Non-African American: 56 — AB (ref >60)
Glucose: 136 mg/dL — ABNORMAL HIGH (ref 70–99)
Potassium: 4.5 mmol/L (ref 3.5–5.1)
Sodium: 138 mmol/L (ref 136–145)

## 2019-08-09 LAB — CBC WITH AUTO DIFFERENTIAL
Basophils %: 0.6 %
Basophils Absolute: 0 10*3/uL (ref 0.0–0.2)
Eosinophils %: 3.1 %
Eosinophils Absolute: 0.2 10*3/uL (ref 0.0–0.6)
Hematocrit: 42.3 % (ref 36.0–48.0)
Hemoglobin: 14.2 g/dL (ref 12.0–16.0)
Lymphocytes %: 33.2 %
Lymphocytes Absolute: 2.3 10*3/uL (ref 1.0–5.1)
MCH: 30.1 pg (ref 26.0–34.0)
MCHC: 33.5 g/dL (ref 31.0–36.0)
MCV: 89.7 fL (ref 80.0–100.0)
MPV: 9.1 fL (ref 5.0–10.5)
Monocytes %: 11 %
Monocytes Absolute: 0.8 10*3/uL (ref 0.0–1.3)
Neutrophils %: 52.1 %
Neutrophils Absolute: 3.6 10*3/uL (ref 1.7–7.7)
Platelets: 157 10*3/uL (ref 135–450)
RBC: 4.72 M/uL (ref 4.00–5.20)
RDW: 13.6 % (ref 12.4–15.4)
WBC: 7 10*3/uL (ref 4.0–11.0)

## 2019-08-09 LAB — TROPONIN
Troponin: <0.01 ng/mL (ref <0.01)
Troponin: <0.01 ng/mL (ref <0.01)
Troponin: <0.01 ng/mL (ref <0.01)
Troponin: <0.01 ng/mL (ref <0.01)

## 2019-08-09 LAB — EKG 12-LEAD
Atrial Rate: 64 {beats}/min
P Axis: 40 degrees
P-R Interval: 136 ms
Q-T Interval: 414 ms
QRS Duration: 72 ms
QTc Calculation (Bazett): 427 ms
R Axis: -8 degrees
T Axis: 29 degrees
Ventricular Rate: 64 {beats}/min

## 2019-08-09 LAB — LIPID PANEL
Cholesterol, Total: 150 mg/dL (ref 0–199)
HDL: 43 mg/dL (ref 40–60)
LDL Calculated: 80 mg/dL (ref <100)
Triglycerides: 134 mg/dL (ref 0–150)
VLDL Cholesterol Calculated: 27 mg/dL

## 2019-08-09 MED ORDER — MORPHINE SULFATE 4 MG/ML IJ SOLN
4 MG/ML | Freq: Once | INTRAMUSCULAR | Status: DC
Start: 2019-08-09 — End: 2019-08-09

## 2019-08-09 MED ORDER — HEPARIN (PORCINE) IN NACL 1000-0.9 UT/500ML-% IV SOLN
1000-0.9 | INTRAVENOUS | Status: AC
Start: 2019-08-09 — End: 2019-08-09

## 2019-08-09 MED ORDER — LISINOPRIL 20 MG PO TABS
20 MG | Freq: Every day | ORAL | Status: DC
Start: 2019-08-09 — End: 2019-08-10
  Administered 2019-08-09: 13:00:00 40 mg via ORAL

## 2019-08-09 MED ORDER — ACETAMINOPHEN 650 MG RE SUPP
650 MG | Freq: Four times a day (QID) | RECTAL | Status: DC | PRN
Start: 2019-08-09 — End: 2019-08-10

## 2019-08-09 MED ORDER — ASPIRIN 81 MG PO CHEW
81 MG | Freq: Once | ORAL | Status: AC
Start: 2019-08-09 — End: 2019-08-09
  Administered 2019-08-09: 08:00:00 324 mg via ORAL

## 2019-08-09 MED ORDER — HYDROCODONE-ACETAMINOPHEN 5-325 MG PO TABS
5-325 MG | Freq: Four times a day (QID) | ORAL | Status: DC | PRN
Start: 2019-08-09 — End: 2019-08-10
  Administered 2019-08-09 – 2019-08-10 (×3): 1 via ORAL

## 2019-08-09 MED ORDER — ONDANSETRON HCL 4 MG/2ML IJ SOLN
4 MG/2ML | Freq: Four times a day (QID) | INTRAMUSCULAR | Status: DC | PRN
Start: 2019-08-09 — End: 2019-08-10
  Administered 2019-08-09: 13:00:00 4 mg via INTRAVENOUS

## 2019-08-09 MED ORDER — ASPIRIN 81 MG PO TBEC
81 MG | Freq: Every day | ORAL | Status: DC
Start: 2019-08-09 — End: 2019-08-10
  Administered 2019-08-10: 12:00:00 81 mg via ORAL

## 2019-08-09 MED ORDER — PROMETHAZINE HCL 25 MG PO TABS
25 MG | Freq: Four times a day (QID) | ORAL | Status: DC | PRN
Start: 2019-08-09 — End: 2019-08-10
  Administered 2019-08-09: 08:00:00 12.5 mg via ORAL

## 2019-08-09 MED ORDER — METOPROLOL TARTRATE 50 MG PO TABS
50 MG | Freq: Two times a day (BID) | ORAL | Status: DC
Start: 2019-08-09 — End: 2019-08-10
  Administered 2019-08-10: 12:00:00 100 mg via ORAL

## 2019-08-09 MED ORDER — HEPARIN SODIUM (PORCINE) 1000 UNIT/ML IJ SOLN
1000 | INTRAMUSCULAR | Status: AC
Start: 2019-08-09 — End: 2019-08-09

## 2019-08-09 MED ORDER — SODIUM CHLORIDE 0.9 % IV BOLUS
0.9 % | Freq: Once | INTRAVENOUS | Status: AC
Start: 2019-08-09 — End: 2019-08-09
  Administered 2019-08-09: 05:00:00 1000 mL via INTRAVENOUS

## 2019-08-09 MED ORDER — ACETAMINOPHEN 325 MG PO TABS
325 MG | Freq: Four times a day (QID) | ORAL | Status: DC | PRN
Start: 2019-08-09 — End: 2019-08-10

## 2019-08-09 MED ORDER — VITAMIN D 50 MCG (2000 UT) PO TABS
50 MCG (2000 UT) | Freq: Every day | ORAL | Status: DC
Start: 2019-08-09 — End: 2019-08-09

## 2019-08-09 MED ORDER — MIDAZOLAM HCL 2 MG/2ML IJ SOLN
2 | INTRAMUSCULAR | Status: AC
Start: 2019-08-09 — End: 2019-08-09

## 2019-08-09 MED ORDER — CETIRIZINE HCL 10 MG PO TABS
10 MG | Freq: Every day | ORAL | Status: DC
Start: 2019-08-09 — End: 2019-08-10
  Administered 2019-08-09 – 2019-08-10 (×2): 10 mg via ORAL

## 2019-08-09 MED ORDER — LIDOCAINE HCL (PF) 1 % IJ SOLN
1 | INTRAMUSCULAR | Status: AC
Start: 2019-08-09 — End: 2019-08-09

## 2019-08-09 MED ORDER — NORMAL SALINE FLUSH 0.9 % IV SOLN
0.9 % | INTRAVENOUS | Status: DC | PRN
Start: 2019-08-09 — End: 2019-08-10

## 2019-08-09 MED ORDER — ENOXAPARIN SODIUM 40 MG/0.4ML SC SOLN
400.4 MG/0.4ML | Freq: Every day | SUBCUTANEOUS | Status: DC
Start: 2019-08-09 — End: 2019-08-10

## 2019-08-09 MED ORDER — ASPIRIN 81 MG PO CHEW
81 MG | Freq: Once | ORAL | Status: DC
Start: 2019-08-09 — End: 2019-08-09

## 2019-08-09 MED ORDER — ONDANSETRON HCL 4 MG/2ML IJ SOLN
4 MG/2ML | Freq: Once | INTRAMUSCULAR | Status: AC
Start: 2019-08-09 — End: 2019-08-09
  Administered 2019-08-09: 05:00:00 4 mg via INTRAVENOUS

## 2019-08-09 MED ORDER — GABAPENTIN 300 MG PO CAPS
300 MG | Freq: Every evening | ORAL | Status: DC
Start: 2019-08-09 — End: 2019-08-10
  Administered 2019-08-10: 600 mg via ORAL

## 2019-08-09 MED ORDER — FENTANYL CITRATE (PF) 100 MCG/2ML IJ SOLN
100 | INTRAMUSCULAR | Status: AC
Start: 2019-08-09 — End: 2019-08-09

## 2019-08-09 MED ORDER — IOPAMIDOL 76 % IV SOLN
76 % | Freq: Once | INTRAVENOUS | Status: AC
Start: 2019-08-09 — End: 2019-08-09
  Administered 2019-08-09: 19:00:00 51 mL

## 2019-08-09 MED ORDER — NORMAL SALINE FLUSH 0.9 % IV SOLN
0.9 % | Freq: Two times a day (BID) | INTRAVENOUS | Status: DC
Start: 2019-08-09 — End: 2019-08-10
  Administered 2019-08-09 – 2019-08-10 (×3): 10 mL via INTRAVENOUS

## 2019-08-09 MED ORDER — MONTELUKAST SODIUM 10 MG PO TABS
10 MG | Freq: Every evening | ORAL | Status: DC
Start: 2019-08-09 — End: 2019-08-10
  Administered 2019-08-10: 10 mg via ORAL

## 2019-08-09 MED ORDER — SPIRONOLACTONE 25 MG PO TABS
25 MG | Freq: Every day | ORAL | Status: DC
Start: 2019-08-09 — End: 2019-08-10
  Administered 2019-08-09 – 2019-08-10 (×2): 25 mg via ORAL

## 2019-08-09 MED ORDER — MORPHINE SULFATE 4 MG/ML IJ SOLN
4 MG/ML | Freq: Once | INTRAMUSCULAR | Status: AC
Start: 2019-08-09 — End: 2019-08-09
  Administered 2019-08-09: 05:00:00 4 mg via INTRAVENOUS

## 2019-08-09 MED ORDER — PRASUGREL HCL 10 MG PO TABS
10 MG | Freq: Every day | ORAL | Status: DC
Start: 2019-08-09 — End: 2019-08-10
  Administered 2019-08-09 – 2019-08-10 (×2): 10 mg via ORAL

## 2019-08-09 MED ORDER — MORPHINE SULFATE (PF) 2 MG/ML IV SOLN
2 MG/ML | INTRAVENOUS | Status: AC | PRN
Start: 2019-08-09 — End: 2019-08-09
  Administered 2019-08-09 (×2): 2 mg via INTRAVENOUS

## 2019-08-09 MED ORDER — NITROGLYCERIN 0.4 MG SL SUBL
0.4 | SUBLINGUAL | Status: DC | PRN
Start: 2019-08-09 — End: 2019-08-10

## 2019-08-09 MED ORDER — PANTOPRAZOLE SODIUM 40 MG PO TBEC
40 MG | Freq: Every day | ORAL | Status: DC
Start: 2019-08-09 — End: 2019-08-10
  Administered 2019-08-09 – 2019-08-10 (×2): 40 mg via ORAL

## 2019-08-09 MED ORDER — IOPAMIDOL 76 % IV SOLN
76 % | Freq: Once | INTRAVENOUS | Status: AC | PRN
Start: 2019-08-09 — End: 2019-08-09
  Administered 2019-08-09: 05:00:00 75 mL via INTRAVENOUS

## 2019-08-09 MED ORDER — VERAPAMIL HCL 2.5 MG/ML IV SOLN
2.5 | INTRAVENOUS | Status: AC
Start: 2019-08-09 — End: 2019-08-09

## 2019-08-09 MED ORDER — ALBUTEROL SULFATE HFA 108 (90 BASE) MCG/ACT IN AERS
108 (90 Base) MCG/ACT | Freq: Four times a day (QID) | RESPIRATORY_TRACT | Status: DC | PRN
Start: 2019-08-09 — End: 2019-08-10

## 2019-08-09 MED ORDER — NITROGLYCERIN IN D5W 200-5 MCG/ML-% IV SOLN
200-5 | INTRAVENOUS | Status: AC
Start: 2019-08-09 — End: 2019-08-09

## 2019-08-09 MED ORDER — RANOLAZINE ER 500 MG PO TB12
500 MG | Freq: Two times a day (BID) | ORAL | Status: DC
Start: 2019-08-09 — End: 2019-08-10
  Administered 2019-08-09 – 2019-08-10 (×3): 500 mg via ORAL

## 2019-08-09 MED ORDER — POLYETHYLENE GLYCOL 3350 17 G PO PACK
17 g | Freq: Every day | ORAL | Status: DC | PRN
Start: 2019-08-09 — End: 2019-08-10

## 2019-08-09 MED ORDER — ROSUVASTATIN CALCIUM 10 MG PO TABS
10 MG | Freq: Every day | ORAL | Status: DC
Start: 2019-08-09 — End: 2019-08-10
  Administered 2019-08-09 – 2019-08-10 (×2): 10 mg via ORAL

## 2019-08-09 MED ORDER — CALCIUM CARBONATE ANTACID 500 MG PO CHEW
500 MG | Freq: Three times a day (TID) | ORAL | Status: DC | PRN
Start: 2019-08-09 — End: 2019-08-10

## 2019-08-09 MED FILL — HYDROCODONE-ACETAMINOPHEN 5-325 MG PO TABS: 5-325 mg | ORAL | Qty: 1

## 2019-08-09 MED FILL — ASPIRIN LOW STRENGTH 81 MG PO CHEW: 81 mg | ORAL | Qty: 4

## 2019-08-09 MED FILL — EFFIENT 10 MG PO TABS: 10 mg | ORAL | Qty: 1

## 2019-08-09 MED FILL — METOPROLOL TARTRATE 50 MG PO TABS: 50 mg | ORAL | Qty: 2

## 2019-08-09 MED FILL — LIDOCAINE HCL (PF) 1 % IJ SOLN: 1 % | INTRAMUSCULAR | Qty: 30

## 2019-08-09 MED FILL — HEPARIN SODIUM (PORCINE) 1000 UNIT/ML IJ SOLN: 1000 UNIT/ML | INTRAMUSCULAR | Qty: 10

## 2019-08-09 MED FILL — ASPIRIN LOW STRENGTH 81 MG PO CHEW: 81 MG | ORAL | Qty: 4

## 2019-08-09 MED FILL — MORPHINE SULFATE 2 MG/ML IJ SOLN: 2 mg/mL | INTRAMUSCULAR | Qty: 1

## 2019-08-09 MED FILL — MORPHINE SULFATE 4 MG/ML IJ SOLN: 4 mg/mL | INTRAMUSCULAR | Qty: 1

## 2019-08-09 MED FILL — VERAPAMIL HCL 2.5 MG/ML IV SOLN: 2.5 MG/ML | INTRAVENOUS | Qty: 2

## 2019-08-09 MED FILL — NITROGLYCERIN IN D5W 200-5 MCG/ML-% IV SOLN: 200-5 MCG/ML-% | INTRAVENOUS | Qty: 250

## 2019-08-09 MED FILL — ROSUVASTATIN CALCIUM 10 MG PO TABS: 10 MG | ORAL | Qty: 1

## 2019-08-09 MED FILL — LOVENOX 40 MG/0.4ML SC SOLN: 40 MG/0.4ML | SUBCUTANEOUS | Qty: 0.4

## 2019-08-09 MED FILL — PROMETHAZINE HCL 25 MG PO TABS: 25 MG | ORAL | Qty: 1

## 2019-08-09 MED FILL — ONDANSETRON HCL 4 MG/2ML IJ SOLN: 4 MG/2ML | INTRAMUSCULAR | Qty: 2

## 2019-08-09 MED FILL — MIDAZOLAM HCL 2 MG/2ML IJ SOLN: 2 MG/ML | INTRAMUSCULAR | Qty: 2

## 2019-08-09 MED FILL — FENTANYL CITRATE (PF) 100 MCG/2ML IJ SOLN: 100 MCG/2ML | INTRAMUSCULAR | Qty: 2

## 2019-08-09 MED FILL — RANOLAZINE ER 500 MG PO TB12: 500 mg | ORAL | Qty: 1

## 2019-08-09 MED FILL — CETIRIZINE HCL 10 MG PO TABS: 10 MG | ORAL | Qty: 1

## 2019-08-09 MED FILL — HEPARIN (PORCINE) IN NACL 1000-0.9 UT/500ML-% IV SOLN: INTRAVENOUS | Qty: 1000

## 2019-08-09 MED FILL — PANTOPRAZOLE SODIUM 40 MG PO TBEC: 40 MG | ORAL | Qty: 1

## 2019-08-09 MED FILL — PROAIR HFA 108 (90 BASE) MCG/ACT IN AERS: 108 (90 Base) MCG/ACT | RESPIRATORY_TRACT | Qty: 8.5

## 2019-08-09 MED FILL — SPIRONOLACTONE 25 MG PO TABS: 25 MG | ORAL | Qty: 1

## 2019-08-09 MED FILL — LISINOPRIL 20 MG PO TABS: 20 mg | ORAL | Qty: 2

## 2019-08-09 NOTE — ED Notes (Signed)
Pt denies any needs at this time, pt giving new gown and warm blankets. Will continue to monitor.      Starleen Blue, RN  08/09/19 [phone]

## 2019-08-09 NOTE — Progress Notes (Signed)
PM assessment completed. Pt's dressing to her right radial wrist is D&I. There is no swelling or sign of a hematoma at the site. Pt has some slight numbness and tingling but the right radial pulse is good. The hand is of natural color and is warm to the touch. Pt has been educated not to use her right arm to lift anything. She has been educated to report any pain, swelling or bleeding from her right radial site. Held Pt's Metoprolol due to the parameters put in place by MD about her BP. Will hold the Ranexa and recheck BP to see if Pt can tolerate. Pt denied further needs, call light within reach.

## 2019-08-09 NOTE — ED Notes (Signed)
Report called to 3A RN.      Starleen Blue, RN  08/09/19 [phone]

## 2019-08-09 NOTE — Pre Sedation (Signed)
Brief Pre-Op Note/Sedation Assessment      Rachael Mays  [birth year]  [id number]      [id number]  2:28 PM    Planned Procedure: Cardiac Catheterization Procedure    Post Procedure Plan: Return to same level of care    Consent: I have discussed with the patient and/or the patient representative the indication, alternatives, and the possible risks and/or complications of the planned procedure and the anesthesia methods. The patient and/or patient representative appear to understand and agree to proceed.    Chief Complaint: Chest Pain/Pressure  Anginal Equivalent  Dyspnea on Exertion  Dyspnea      Indications for Cath Procedure:  New Onset Angina <= 2 months and Worsening Angina  Anginal Classification within 2 weeks:  CCS III - Symptoms with everyday living activities, i.e., moderate limitation  NYHA Heart Failure Class within 2 weeks: No symptoms  Is Cath Lab Visit Valve-related?: No    Anti- Anginal Meds within 2 weeks:   Yes: Beta Blockers, Ranolazine, Aspirin and Statin (Any)    Stress or Imaging Studies Performed:  None     Vital Signs:  BP (!) 155/74    Pulse 74    Temp 97.9 ??F (36.6 ??C) (Oral)    Resp 18    Ht 5\' 5"  (1.651 m)    Wt (!) 304 lb (137.9 kg)    SpO2 96%    BMI 50.59 kg/m??     Allergies:  Allergies   Allergen Reactions   ??? Adhesive Tape      Local itching   ??? Seasonal    ??? Talwin [Pentazocine] Other (See Comments)     dizzy       Past Medical History:  Past Medical History:   Diagnosis Date   ??? Anxiety    ??? Asthma    ??? Blood circulation, collateral    ??? CAD (coronary artery disease)    ??? CHF (congestive heart failure) ([place])    ??? Chronic back pain    ??? Deep vein thrombosis    ??? 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 Depression    ??? GERD (gastroesophageal reflux disease)     NO LONGER SINCE LAP   ??? GERD (gastroesophageal reflux disease) 01/23/2009   ??? Hx of blood clots    ??? Hyperlipidemia     hx; resolved with lap band   ??? Hypertension    ??? Idiopathic peripheral neuropathy  03/26/2017   ??? Melanoma ([place])     upper back   ??? Obesity     hx of; had lap band   ??? Obstructive sleep apnea 09/18/2015     Updating Deprecated Diagnoses   ??? Obstructive sleep apnea (adult) (pediatric) 09/18/2015   ??? Pulmonary embolism [facility])          Surgical History:  Past Surgical History:   Procedure Laterality Date   ??? BACK SURGERY      neck  plates   ??? BREAST SURGERY      left lumpectomy   ??? CESAREAN SECTION     ??? CHOLECYSTECTOMY     ??? COLONOSCOPY     ??? COLONOSCOPY  04/08/10   ??? CORONARY ANGIOPLASTY     ??? DIAGNOSTIC CARDIAC CATH LAB PROCEDURE     ??? DILATATION, ESOPHAGUS     ??? ENDOSCOPY, COLON, DIAGNOSTIC     ??? HYSTERECTOMY, VAGINAL     ??? LAP BAND  05/01/08    Dr. Nicole Cella   ??? OTHER SURGICAL  HISTORY      [place] Filter: curently in place   ??? OTHER SURGICAL HISTORY  07/05/13    lap band removal   ??? SHOULDER SURGERY     ??? SKIN CANCER EXCISION  12/29/2017   ??? UPPER GASTROINTESTINAL ENDOSCOPY  05/19/13   ??? UPPER GASTROINTESTINAL ENDOSCOPY  07/21/13    ESOPHAGEAL STENT PLACEMENT   ??? UPPER GASTROINTESTINAL ENDOSCOPY N/A 07/31/2014    Esophagogastroduodenoscopy with esophageal balloon dilation   ??? VASCULAR SURGERY  04/2015    Lucendia Herrlich, celiac artery angiogram, normal abd arteries         Medications:  Current Facility-Administered Medications   Medication Dose Route Frequency Provider Last Rate Last Dose   ??? albuterol sulfate HFA 108 (90 Base) MCG/ACT inhaler 2 puff  2 puff Inhalation Q6H PRN Rodena Piety, MD       ??? cetirizine (ZYRTEC) tablet 10 mg  10 mg Oral Daily Rodena Piety, MD   10 mg at 08/09/19 [id number]   ??? gabapentin (NEURONTIN) capsule 600 mg  600 mg Oral Nightly Rodena Piety, MD       ??? lisinopril (PRINIVIL;ZESTRIL) tablet 40 mg  40 mg Oral Daily Rodena Piety, MD   40 mg at 08/09/19 0842   ??? metoprolol tartrate (LOPRESSOR) tablet 100 mg  100 mg Oral BID Rodena Piety, MD   Stopped at 08/09/19 [phone]   ??? montelukast (SINGULAIR) tablet 10 mg  10 mg Oral Nightly Abidemi B Akande, MD       ??? pantoprazole  (PROTONIX) tablet 40 mg  40 mg Oral QAM AC Rodena Piety, MD   40 mg at 08/09/19 0842   ??? calcium carbonate (TUMS) chewable tablet 500 mg  500 mg Oral TID PRN Rodena Piety, MD       ??? prasugrel (EFFIENT) tablet 10 mg  10 mg Oral Daily Rodena Piety, MD   10 mg at 08/09/19 0842   ??? ranolazine (RANEXA) extended release tablet 500 mg  500 mg Oral BID Rodena Piety, MD   500 mg at 08/09/19 0843   ??? rosuvastatin (CRESTOR) tablet 10 mg  10 mg Oral Daily Rodena Piety, MD   10 mg at 08/09/19 0843   ??? spironolactone (ALDACTONE) tablet 25 mg  25 mg Oral Daily Rodena Piety, MD   25 mg at 08/09/19 0843   ??? sodium chloride flush 0.9 % injection 10 mL  10 mL Intravenous 2 times per day Rodena Piety, MD   10 mL at 08/09/19 0844   ??? sodium chloride flush 0.9 % injection 10 mL  10 mL Intravenous PRN Rodena Piety, MD       ??? acetaminophen (TYLENOL) tablet 650 mg  650 mg Oral Q6H PRN Rodena Piety, MD        Or   ??? acetaminophen (TYLENOL) suppository 650 mg  650 mg Rectal Q6H PRN Rodena Piety, MD       ??? polyethylene glycol (GLYCOLAX) packet 17 g  17 g Oral Daily PRN Rodena Piety, MD       ??? promethazine (PHENERGAN) tablet 12.5 mg  12.5 mg Oral Q6H PRN Rodena Piety, MD   12.5 mg at 08/09/19 0347    Or   ??? ondansetron (ZOFRAN) injection 4 mg  4 mg Intravenous Q6H PRN Rodena Piety, MD   4 mg at 08/09/19 0841   ??? enoxaparin (LOVENOX) injection 40 mg  40 mg Subcutaneous Daily  Rodena Piety, MD   Stopped at 08/09/19 [phone]   ??? nitroGLYCERIN (NITROSTAT) SL tablet 0.4 mg  0.4 mg Sublingual Q5 Min PRN Rodena Piety, MD       ??? [START ON 08/10/2019] aspirin EC tablet 81 mg  81 mg Oral Daily Rodena Piety, MD               Pre-Sedation:    Pre-Sedation Documentation and Exam:  I have assessed the patient and agree with the H&P present on the chart.    Prior History of Anesthesia Complications:   none    Modified Mallampati:  II (soft palate, uvula, fauces visible)    ASA  Classification:  Class 2 - A normal healthy patient with mild systemic disease      Aldrete Scale:  Activity:  2 - Able to move 4 extremities voluntarily on command  Respiration:  2 - Able to breathe deeply and cough freely  Circulation:  2 - BP+/- 73mmHg of normal  Consciousness:  2 - Fully awake  Oxygen Saturation (color):  2 - Able to maintain oxygen saturation >92% on room air    Sedation/Anesthesia Plan:  Guard the patient's safety and welfare.  Minimize physical discomfort and pain.  Minimize negative psychological responses to treatment by providing sedation and analgesia and maximize the potential amnesia.  Patient to meet pre-procedure discharge plan.    Medication Planned:  midazolam intravenously and fentanyl intravenously    Patient is an appropriate candidate for plan of sedation: yes      Electronically signed by Eden Lathe, MD on 08/09/2019 at 2:28 PM

## 2019-08-09 NOTE — Progress Notes (Signed)
Pt admitted this am.  H & P was reviewed.  She was admitted with chest pains and has been evaluated by cardiology.  She is currently pain free.  Anticipate LHC today .  Appreciate input from cardiology

## 2019-08-09 NOTE — Consults (Addendum)
[place] [address]                                  CONSULTATION    PATIENT NAME: Rachael Mays, Rachael Mays                     DOB:        [birth year]  MED REC NO:   [id number]                          ROOM:       [id number]  ACCOUNT NO:   [id number]                           ADMIT DATE: 08/08/2019  PROVIDER:     Christean Grief, MD    CONSULT DATE:  08/09/2019    REASON FOR CONSULTATION:  Evaluate chest discomfort.    HISTORY OF PRESENT ILLNESS:  The patient is a pleasant 63 year old lady.  She lives independently.  She works at [employer] as a Scientist, water quality.  She is  under more stress with the current pandemic and at times has not felt  safe in her work environment.  She is having chest pain worse than she  has ever experienced in the past with a chronic chest pain history.  In  2017, she had a drug-eluting stent placed in her left anterior  descending due to positive FFR, at that time a 3.25 x 18 Xience Alpine  drug-eluting stent was placed.  She has done better although she  continues to have chest pain, but the current chest symptoms she is  having is worse than she has experienced in the past.  She also has a  remote history of pulmonary embolism, but on this admission chest CT  scan was negative for pulmonary embolus.  She is on multiple  medications.  She also has been to pain management.  Her medications are  Neurontin 600 mg nightly, albuterol inhaler, Lopressor 100 mg b.i.d.,  Prilosec 40 mg daily, lisinopril 40 mg daily, Singulair 10 mg daily,  aspirin 81 mg daily, multivitamin, Zyrtec, vitamin D, Crestor 10, uses a  Lidoderm patch, nitroglycerin sublingual p.r.n., Ranexa 500 mg b.i.d.,  Effient 10 mg daily, torsemide 20 mg as needed, Aldactone 25 mg daily,  diclofenac gel.  The patient had a CT scan for pulmonary embolus, which  was negative.  She had EKG that is unremarkable.  Troponins have been  unremarkable.  She had a nuclear stress test in  03/2018 that was  negative for myocardial ischemia.    PAST MEDICAL HISTORY:  Notable for history of asthma, history of DVT in  the past, history of pulmonary embolus in the past, hyperlipidemia,  hypertension, peripheral neuropathy.    PAST SURGICAL HISTORY:  Includes cholecystectomy, hysterectomy, lap-band  surgery.    SOCIAL HISTORY:  She quit smoking approximately six years ago, she has  20 pack-year smoking history.  Does not drink alcohol.    FAMILY HISTORY:  Notable for absence of premature coronary artery  disease.    HOME MEDICATIONS:  As listed above.    ALLERGIES:  MORPHINE, TALWIN.    REVIEW OF SYSTEMS:  14-system review  of systems done, otherwise  negative.    PHYSICAL EXAMINATION:  Currently on physical examination:  VITAL SIGNS:  Blood pressure is 150/70, pulse 74.  GENERAL:  She is in no acute distress.  She is complaining of chest  discomfort.  HEENT:  Sclerae are clear.  Posterior pharynx clear.  NECK:  Neck is supple.  There are no carotid bruits.  LUNGS:  Lungs are clear.  CHEST:  Chest wall is nontender to palpation.  ABDOMEN:  Abdomen is obese.  No masses are felt.  EXTREMITIES:  Without edema.  She has good peripheral pulses.  NEUROLOGIC:  Moves all extremities well.  Cranial nerves II through XII  intact.  SKIN:  Warm and dry.    DIAGNOSTIC DATA:  EKG is reviewed, normal EKG.    LABORATORY DATA:  Troponins negative.  Glucose is just slightly elevated  at 136.  HDL 44, LDL 82, total cholesterol 190.    IMPRESSION:  The patient with known coronary artery disease with  concerning symptoms for acute coronary syndrome in spite of negative  diagnostic testing with stress testing and cardiac enzymes.  Also seems  that might be over medication with cardiac medications that is not  alleviating her symptoms.    PLAN:  Discussed cardiac catheterization to make sure her coronary stent  is still patent and to ensure this noncardiac source of chest  discomfort.  This will be done later today.  We will then  make further  changes with medical management based on the findings.    I have spent  70  minutes of face to face time with the patient with more than 50%  spent  counseling and coordinating care for Rachael Chain, MD    D: 08/09/2019 11:11:38       T: 08/09/2019 11:20:10     [email or web address]  Job#: [id number]     Doc#: [id number]    CC:

## 2019-08-09 NOTE — Op Note (Signed)
[place]  Procedure Note    Procedure: LHC  Indication: Unstable angina    Procedure Details:  Consent Access Bleed R Sedat Start Stop Versed Fentanyl Contrast Fluoro EBL Comp Spec   Yes RRA low MCSFC 1457 1522 4 150 51 1.9 <20 None None   *Ultrasound Note: Ultrasound guidance used to determine aforementioned artery patency, size (>59mm), anatomic variations and ideal puncture location.  Real-time ultrasound utilized concurrent with vascular needle entry into the artery.  Image(s) permanently recorded and reported in the patient chart.  *Sedation Note: MCSFC = minimal conscious sedation for comfort    Findings:  Artery Findings/Result   LM Normal   LAD Widely patent stent, 50% ostial D1 jailed by stent (no change)   Cx Normal   RI NA   RCA Normal   LVEDP 6   LVG 60%     Intervention:   None    Post Cath Dx:   Nonobstructive CAD, patent stent LAD

## 2019-08-09 NOTE — ED Notes (Signed)
Pt transported via bed with tele on, pt with mask on. Pt has cpap, and belongings with pt.      Starleen Blue, RN  08/09/19 [phone]

## 2019-08-09 NOTE — ED Notes (Signed)
Pt up to the bathroom, gait steady, no s/s of distress. Pt repositioned  in bed, respirations easy, even, and unlabored. Pt NSR on the monitor. Alert and orientated. Family at bedside.      Starleen Blue, RN  08/09/19 [phone]

## 2019-08-09 NOTE — Plan of Care (Signed)
Problem: Pain:  Goal: Pain level will decrease  Description: Pain level will decrease  Outcome: Ongoing  Goal: Control of acute pain  Description: Control of acute pain  Outcome: Ongoing  Goal: Control of chronic pain  Description: Control of chronic pain  Outcome: Ongoing

## 2019-08-09 NOTE — Progress Notes (Signed)
Pt admitted from ED by stretcher. Admission completed, and initial assessment. Pt c/o CP upon arrival to the floor requesting Morphine Pt had received in ED. Sent message through [employer] awaiting response. Pt also c/o nausea and had recently received Zofran in ED, offered Phenergan tablet and Pt accepted. Pt updated on the POC and is aware she is not to eat or drink. She has been told cardiology will see her in the morning. Pt has been oriented to the room and call light. Call light has been placed within reach.

## 2019-08-09 NOTE — H&P (Signed)
Hospital Medicine History and Physical    08/09/2019    Date of Admission: 08/09/2019    Date of Service: Pt seen/examined on 08/09/2019 and admitted to inpatient.      Assessment/plan:  1. Chest pain.  Patient with history of coronary artery disease, atypical presentation in the past.  Continue on aspirin, Prasugrel, beta-blocker, statin.  Will also continue home dose of Ranexa.  Nitroglycerin in place.  Check lipid profile.  Monitor on telemetry.  N.p.o.  Consult to cardiology to assist with evaluation.  2. Abnormal finding on CT-chest with a 2 mm right adrenal nodule.  This will require close monitoring.  Recommend dedicated adrenal MRI as outpatient.  This has been discussed with patient details.  3. Other comorbidities: History of CAD status post PCI, history of chronic diastolic CHF, history of DVT and pulmonary embolism (CT-PE protocol was negative for pulmonary embolism in the emergency room this admission), history of GERD, hyperlipidemia, essential hypertension, obstructive sleep apnea, morbid obesity with BMI of 49.9 kg/m??.    Activities: Up with assist  Prophylaxis: Subcutaneous Lovenox  Code status: Full code    ==========================================================  Chief complaint:  Chief Complaint   Patient presents with   ??? Chest Pain     Pt presents to the ED with CP in the center of chest that began yesterday while getting ready for work. Pt had stent placed in 2019, pt states she got nervous when she started to N/D this evening with increased CP and SOB this afternoon         History of Presenting Illness:  This is a pleasant 63 y.o. female with history of CAD status post PCI in 2019, history of chronic diastolic CHF, history of DVT and pulmonary embolism, gastroesophageal reflux disease, hyperlipidemia, essential hypertension, obstructive sleep apnea, prior history of tobacco use disorder, who presents to the emergency room with complaints of substernal chest discomfort, ongoing for the past  day.  There is no clear exertional component to chest discomfort.  She has had some shortness of breath, though not present currently.  She has had nausea but no vomiting.  She contacted Dr. Luiz Blare, cardiologist, who recommends presentation to the emergency room for further evaluation.  Initial troponin and EKG in the emergency room were unremarkable.  Patient had normal stress test in April 2020.    Past Medical History:      Diagnosis Date   ??? Anxiety    ??? Asthma    ??? Blood circulation, collateral    ??? CAD (coronary artery disease)    ??? CHF (congestive heart failure) ([place])    ??? Chronic back pain    ??? Deep vein thrombosis    ??? 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 Depression    ??? GERD (gastroesophageal reflux disease)     NO LONGER SINCE LAP   ??? GERD (gastroesophageal reflux disease) 01/23/2009   ??? Hx of blood clots    ??? Hyperlipidemia     hx; resolved with lap band   ??? Hypertension    ??? Idiopathic peripheral neuropathy 03/26/2017   ??? Melanoma ([place])     upper back   ??? Obesity     hx of; had lap band   ??? Obstructive sleep apnea 09/18/2015     Updating Deprecated Diagnoses   ??? Obstructive sleep apnea (adult) (pediatric) 09/18/2015   ??? Pulmonary embolism [facility])        Past Surgical History:      Procedure Laterality Date   ???  BACK SURGERY      neck  plates   ??? BREAST SURGERY      left lumpectomy   ??? CESAREAN SECTION     ??? CHOLECYSTECTOMY     ??? COLONOSCOPY     ??? COLONOSCOPY  04/08/10   ??? CORONARY ANGIOPLASTY     ??? DIAGNOSTIC CARDIAC CATH LAB PROCEDURE     ??? DILATATION, ESOPHAGUS     ??? ENDOSCOPY, COLON, DIAGNOSTIC     ??? HYSTERECTOMY, VAGINAL     ??? LAP BAND  05/01/08    Dr. Nicole Cella   ??? OTHER SURGICAL HISTORY      [place] Filter: curently in place   ??? OTHER SURGICAL HISTORY  07/05/13    lap band removal   ??? SHOULDER SURGERY     ??? SKIN CANCER EXCISION  12/29/2017   ??? UPPER GASTROINTESTINAL ENDOSCOPY  05/19/13   ??? UPPER GASTROINTESTINAL ENDOSCOPY  07/21/13    ESOPHAGEAL STENT PLACEMENT   ??? UPPER  GASTROINTESTINAL ENDOSCOPY N/A 07/31/2014    Esophagogastroduodenoscopy with esophageal balloon dilation   ??? VASCULAR SURGERY  04/2015    Lucendia Herrlich, celiac artery angiogram, normal abd arteries       Medications (prior to admission):  Prior to Admission medications    Medication Sig Start Date End Date Taking? Authorizing Provider   acetic acid-hydrocortisone (VOSOL-HC) 1-2 % otic solution 4 drops in affected ear or ears every 3 hours while awake 07/27/19  Yes Alvina Chou, MD   methylPREDNISolone (MEDROL, PAK,) 4 MG tablet Take 1 tablet by mouth See Admin Instructions Take by mouth. 07/27/19  Yes Alvina Chou, MD   gabapentin (NEURONTIN) 300 MG capsule Take 2 capsules by mouth nightly. 07/13/19  Yes Almira Coaster, APRN - CNP   albuterol sulfate HFA (PROVENTIL HFA) 108 (90 Base) MCG/ACT inhaler Inhale 2 puffs into the lungs every 6 hours as needed for Wheezing 07/13/19  Yes Almira Coaster, APRN - CNP   metoprolol (LOPRESSOR) 100 MG tablet Take 1 twice a day 07/13/19  Yes Tommi Rumps, APRN - CNP   lidocaine (LIDODERM) 5 % Place 1 patch onto the skin daily 12 hours on, 12 hours off. 04/05/19  Yes Sharmaine Base Wanninger, PA-C   nitroGLYCERIN (NITROSTAT) 0.4 MG SL tablet up to max of 3 total doses. If no relief after 1 dose, call 911. 03/31/19  Yes Luiz Iron, APRN - CNP   omeprazole (PRILOSEC) 40 MG delayed release capsule TAKE 1 CAPSULE BY MOUTH ONE TIME A DAY 02/24/19  Yes Stephan Minister, MD   lisinopril (PRINIVIL;ZESTRIL) 40 MG tablet TAKE 1 TABLET BY MOUTH ONE TIME A DAY 02/24/19  Yes Stephan Minister, MD   montelukast (SINGULAIR) 10 MG tablet TAKE 1 TABLET BY MOUTH ONE TIME A DAY 02/24/19  Yes Stephan Minister, MD   diclofenac sodium 1 % GEL Apply 2 g topically 2 times daily 09/16/18  Yes Stephan Minister, MD   aspirin 81 MG EC tablet Take 1 tablet by mouth daily 02/16/17  Yes Harvel Quale, MD   mupirocin (BACTROBAN) 2 % ointment Apply topically  Daily.Apply with Q tip in each naris at night 01/04/17   Yes Alvina Chou, MD   Multiple Vitamins-Minerals (MULTIVITAMIN PO) Take 1 tablet by mouth daily    Yes Historical Provider, MD   CALCIUM-VITAMIN D PO Take 1 tablet by mouth daily    Yes Historical Provider, MD   cetirizine (ZYRTEC ALLERGY) 10 MG tablet Take 10 mg by mouth daily.  Yes Historical Provider, MD   vitamin D (CHOLECALCIFEROL) 50 MCG (2000 UT) TABS tablet Take 1 tablet by mouth daily After finishing 12 week couse 07/27/19   Oletta Lamas, MD   vitamin D (ERGOCALCIFEROL) 1.25 MG (50000 UT) CAPS capsule Take 1 capsule by mouth once a week 07/27/19   Oletta Lamas, MD   rosuvastatin (CRESTOR) 10 MG tablet TAKE ONE TABLET BY MOUTH DAILY 06/26/19   Luiz Iron, APRN - CNP   ranolazine (RANEXA) 500 MG extended release tablet TAKE ONE TABLET BY MOUTH TWICE A DAY 03/22/19   Luiz Iron, APRN - CNP   prasugrel (EFFIENT) 10 MG TABS Take 1 tablet by mouth daily 03/22/19   Luiz Iron, APRN - CNP   torsemide (DEMADEX) 20 MG tablet Take 1 tablet by mouth daily as needed (swelling) 03/22/19   Luiz Iron, APRN - CNP   spironolactone (ALDACTONE) 25 MG tablet TAKE ONE AND ONE-HALF TABLETS BY MOUTH EVERY DAY  Patient taking differently: Take 25 mg by mouth daily One tab daily 02/24/19   Stephan Minister, MD   zoster recombinant adjuvanted vaccine [facility]) 50 MCG SUSR injection .63mL IM X 1 followed by .38mL IM 2-6 months after dose #1 08/26/17   Harvel Quale, MD   triamcinolone (KENALOG) 0.1 % cream Apply topically 2 times daily.Apply with Q tip in each ear BID for 1 week. 01/04/17   Alvina Chou, MD       Allergy(ies):  Adhesive tape; Seasonal; and Talwin [pentazocine]    Social History:  TOBACCO:  reports that she quit smoking about 7 years ago. Her smoking use included cigarettes. She has a 20.00 pack-year smoking history. She quit smokeless tobacco use about 6 years ago.  ETOH:  reports no history of alcohol use.    Family History:      Problem Relation Age of Onset   ??? Arthritis Mother     ??? Cancer Mother    ??? Depression Mother    ??? High Blood Pressure Mother    ??? Heart Disease Mother 20        MI    ??? Ovarian Cancer Mother 39   ??? Cancer Father 64        colon   ??? Heart Disease Maternal Grandmother    ??? Early Death Paternal Grandmother    ??? Heart Disease Paternal Grandfather    ??? Diabetes Other    ??? High Blood Pressure Other    ??? Obesity Other    ??? Other Sister         OSA   ??? Other Brother         OSA   ??? Other Brother         OSA   ??? Other Daughter         OSA       Review of Systems:  Pertinent positives are listed in HPI. At least 10-point ROS reviewed and were negative.     Vitals and physical examination:  BP (!) 120/45    Pulse 65    Temp 98.2 ??F (36.8 ??C) (Oral)    Resp 13    Ht 5\' 5"  (1.651 m)    Wt 300 lb (136.1 kg)    SpO2 95%    BMI 49.92 kg/m??   Gen/overall appearance: Not in acute distress. Alert.  Oriented x3.  Head: Normocephalic, atraumatic  Eyes: EOMI, good acuity  ENT: Oral mucosa moist  Neck: No JVD, thyromegaly  CVS: Nml  S1S2, no MRG, RRR  Pulm: Clear bilaterally. No crackles/wheezes  Gastrointestinal: Soft, NT/ND, +BS  Musculoskeletal: No edema. Warm  Neuro: No focal deficit. Moves extremity spontaneously.  Psychiatry: Appropriate affect. Not agitated.  Skin: Warm, dry with normal turgor. No rash  Capillary refill: Brisk,< 3 seconds   Peripheral Pulses: +2 palpable, equal bilaterally       Labs/imaging/EKG:  CBC:   Recent Labs     08/08/19  1940   WBC 7.0   HGB 14.2   PLT 157     BMP:    Recent Labs     08/08/19  1940   NA 138   K 4.5   CL 103   CO2 23   BUN 18   CREATININE 1.0   GLUCOSE 136*     Hepatic: No results for input(s): AST, ALT, ALB, BILITOT, ALKPHOS in the last 72 hours.    Xr Chest (2 Vw)    Result Date: 08/08/2019  EXAMINATION: TWO XRAY VIEWS OF THE CHEST 08/08/2019 8:36 pm COMPARISON: May 04, 2017 HISTORY: ORDERING SYSTEM PROVIDED HISTORY: cp TECHNOLOGIST PROVIDED HISTORY: Reason for exam:->cp FINDINGS: Marginal inspiration is noted.  No focal area of consolidation  or pneumothorax is present.  Heart size and mediastinal contours appear normal for the level of inspiration.  Anterior fixation plates are seen within the mid and lower cervical spine.     No evidence of acute cardiopulmonary disease     Ct Chest Pulmonary Embolism W Contrast    Result Date: 08/09/2019  EXAMINATION: CTA OF THE CHEST 08/09/2019 12:55 am TECHNIQUE: CTA of the chest was performed after the administration of intravenous contrast.  Multiplanar reformatted images are provided for review.  MIP images are provided for review. Dose modulation, iterative reconstruction, and/or weight based adjustment of the mA/kV was utilized to reduce the radiation dose to as low as reasonably achievable. COMPARISON: 12/21/2016. HISTORY: ORDERING SYSTEM PROVIDED HISTORY: CP, SOB, hx PE TECHNOLOGIST PROVIDED HISTORY: Reason for exam:->CP, SOB, hx PE Reason for Exam: CP, SOB, hx PE Acuity: Acute Type of Exam: Initial FINDINGS: Pulmonary Arteries: No evidence of a pulmonary embolus.  The main pulmonary trunk is normal in caliber. Mediastinum: The thoracic aorta demonstrates no acute abnormality.  There is no bulky mediastinal or hilar adenopathy.  They are appears to be a coronary stent within the proximal left anterior descending artery.  The heart is normal in size.  No pericardial effusion. Lungs/pleura: The central tracheobronchial tree appears patent bilaterally. A calcified granuloma is seen within the right lower lobe.  No focal consolidation.  No pleural effusion or pneumothorax. Upper Abdomen: Suggestion of an indeterminate 18 mm right adrenal nodule, which is similar to the prior exam.  No acute abnormality is seen in the visualized upper abdomen. Soft Tissues/Bones: No acute bone or soft tissue abnormality.     1. No evidence of pulmonary embolism or acute pulmonary abnormality. 2. Re-identified is an 18 mm right adrenal nodule.       EKG: Normal sinus rhythm with no acute ST/T changes.  I reviewed EKG.    Discussed with  ER provider.      Thank you Almira Coaster, APRN - CNP for the opportunity to be involved in this patient's care.    -----------------------------  Rodena Piety, MD  Rounding hospitalist

## 2019-08-10 ENCOUNTER — Encounter: Attending: Family | Primary: Family

## 2019-08-10 LAB — BASIC METABOLIC PANEL
Anion Gap: 9 (ref 3–16)
BUN: 14 mg/dL (ref 7–20)
CO2: 25 mmol/L (ref 21–32)
Calcium: 9.1 mg/dL (ref 8.3–10.6)
Chloride: 105 mmol/L (ref 99–110)
Creatinine: 1 mg/dL (ref 0.6–1.2)
GFR African American: >60 (ref >60)
GFR Non-African American: 56 — AB (ref >60)
Glucose: 104 mg/dL — ABNORMAL HIGH (ref 70–99)
Potassium: 4.1 mmol/L (ref 3.5–5.1)
Sodium: 139 mmol/L (ref 136–145)

## 2019-08-10 MED FILL — LOVENOX 40 MG/0.4ML SC SOLN: 40 MG/0.4ML | SUBCUTANEOUS | Qty: 0.4

## 2019-08-10 MED FILL — ROSUVASTATIN CALCIUM 10 MG PO TABS: 10 MG | ORAL | Qty: 1

## 2019-08-10 MED FILL — ASPIRIN LOW DOSE 81 MG PO TBEC: 81 MG | ORAL | Qty: 1

## 2019-08-10 MED FILL — EFFIENT 10 MG PO TABS: 10 MG | ORAL | Qty: 1

## 2019-08-10 MED FILL — HYDROCODONE-ACETAMINOPHEN 5-325 MG PO TABS: 5-325 mg | ORAL | Qty: 1

## 2019-08-10 MED FILL — LISINOPRIL 20 MG PO TABS: 20 mg | ORAL | Qty: 2

## 2019-08-10 MED FILL — GABAPENTIN 300 MG PO CAPS: 300 MG | ORAL | Qty: 2

## 2019-08-10 MED FILL — PANTOPRAZOLE SODIUM 40 MG PO TBEC: 40 mg | ORAL | Qty: 1

## 2019-08-10 MED FILL — SPIRONOLACTONE 25 MG PO TABS: 25 MG | ORAL | Qty: 1

## 2019-08-10 MED FILL — METOPROLOL TARTRATE 50 MG PO TABS: 50 mg | ORAL | Qty: 2

## 2019-08-10 MED FILL — RANOLAZINE ER 500 MG PO TB12: 500 MG | ORAL | Qty: 1

## 2019-08-10 MED FILL — MONTELUKAST SODIUM 10 MG PO TABS: 10 mg | ORAL | Qty: 1

## 2019-08-10 MED FILL — CETIRIZINE HCL 10 MG PO TABS: 10 mg | ORAL | Qty: 1

## 2019-08-10 NOTE — Discharge Instructions (Signed)
Chest Pain: Care Instructions  Your Care Instructions     There are many things that can cause chest pain. Some are not serious and will get better on their own in a few days. But some kinds of chest pain need more testing and treatment. Your doctor may have recommended a follow-up visit in the next 8 to 12 hours. If you are not getting better, you may need more tests or treatment.  Even though your doctor has released you, you still need to watch for any problems. The doctor carefully checked you, but sometimes problems can develop later. If you have new symptoms or if your symptoms do not get better, get medical care right away.  If you have worse or different chest pain or pressure that lasts more than 5 minutes or you passed out (lost consciousness), call911 or seek other emergency help right away.   A medical visit is only one step in your treatment. Even if you feel better, you still need to do what your doctor recommends, such as going to all suggested follow-up appointments and taking medicines exactly as directed. This will help you recover and help prevent future problems.  How can you care for yourself at home?   Rest until you feel better.   Take your medicine exactly as prescribed. Call your doctor if you think you are having a problem with your medicine.   Do not drive after taking a prescription pain medicine.  When should you call for help?   Call911if:    You passed out (lost consciousness).   You have severe difficulty breathing.   You have symptoms of a heart attack. These may include:  ? Chest pain or pressure, or a strange feeling in your chest.  ? Sweating.  ? Shortness of breath.  ? Nausea or vomiting.  ? Pain, pressure, or a strange feeling in your back, neck, jaw, or upper belly or in one or both shoulders or arms.  ? Lightheadedness or sudden weakness.  ? A fast or irregular heartbeat.  After you call 911, the operator may tell you to chew 1 adult-strength or 2 to 4 low-dose  aspirin. Wait for an ambulance. Do not try to drive yourself.  Call your doctor today if:    You have any trouble breathing.   Your chest pain gets worse.   You are dizzy or lightheaded, or you feel like you may faint.   You are not getting better as expected.   You are having new or different chest pain.  Where can you learn more?  Go to [email or web address] and sign in to your MyChart account. Enter A120 in the Search Health Information box to learn more about "Chest Pain: Care Instructions."     If you do not have an account, please click on the "Sign Up Now" link.  Current as of: June 26, 2019Content Version: 12.5   2006-2020 [employer].   Care instructions adapted under license by [facility]. If you have questions about a medical condition or this instruction, always ask your healthcare professional. [employer] disclaims any warranty or liability for your use of this information.

## 2019-08-10 NOTE — Progress Notes (Signed)
Data- discharge order received, pt verbalized agreement to discharge, disposition to previous residence, no needs for HHC/DME.     Action- discharge instructions prepared and given to pt, pt verbalized understanding. Medication information packet given r/t NEW and/or CHANGED prescriptions emphasizing name/purpose/side effects, pt verbalized understanding. Discharge instruction summary: Diet- cardiac, Activity- as tolerated, Primary Care Physician as follows: Almira Coaster, APRN - CNP [phone] f/u appointment to be made by pt, immunizations reviewed, prescription medications filled in pt preferred pharmacy. Inpatient surgical procedure precautions reviewed: yes CHF Education reviewed. Pt/ Family has had a total of 60 minutes CHF education this admission encounter.     Response- Pt belongings gathered, IV removed. Disposition is home (no HHC/DME needs), transported with belongings, taken to lobby via w/c w/ family, no complications.

## 2019-08-10 NOTE — Care Coordination-Inpatient (Signed)
Patient admitted as Observation with an anticipated short hospitalization length of stay. Chart reviewed and it appears that patient has minimal needs for discharge at this time. Discussed with patient???s nurse and requested that case management be notified if discharge needs are identified.     Case management will continue to follow progress and update discharge plan as needed.

## 2019-08-10 NOTE — Plan of Care (Signed)
Post procedure site check completed. Surgical site is within normal limits and appears to be free of complications. All concerns were reviewed and questions answered. Patient verbalized understanding.

## 2019-08-10 NOTE — Discharge Summary (Signed)
[facility] DISCHARGE SUMMARY    Patient Demographics    Patient. Rachael Mays  Date of Birth. [birth year]  MRN. [id number]     Primary care provider. Almira Coaster, APRN - CNP  (Tel: [phone])    Admit date: 08/08/2019    Discharge date 08/10/2019  Note Date: 08/10/2019     Reason for Hospitalization.   Chief Complaint   Patient presents with   ??? Chest Pain     Pt presents to the ED with CP in the center of chest that began yesterday while getting ready for work. Pt had stent placed in 2019, pt states she got nervous when she started to N/D this evening with increased CP and SOB this afternoon         Significant Findings.   Active Problems:    Chest pain with high risk for cardiac etiology  Resolved Problems:    * No resolved hospital problems. *       Problems and results from this hospitalization that need follow up.  18 mm right adrenal nodule :  Consider outpatient MRI for further evaluation     Significant test results and incidental findings.  No evidence of pulmonary embolism or acute pulmonary abnormality.    2. Re-identified is an 18 mm right adrenal nodule.      Upper Abdomen: Suggestion of an indeterminate 18 mm right adrenal nodule,    which is similar to the prior exam. ??No acute abnormality is seen in the    visualized upper abdomen         Invasive procedures and treatments.   LHC on 08/09/19     Findings/Result    LM Normal   LAD Widely patent stent, 50% ostial D1 jailed by stent (no change)   Cx Normal   RI NA   RCA Normal   LVEDP 6   LVG 60%   ??  Intervention:         None  ??  Post Cath Dx:       Nonobstructive CAD, patent stent LAD                    [facility] Course.  The patient was admitted through the ED with reports of chest pain.  She had a CTPA which was negative for PE.  She was admitted and followed by cardiology.  She was taken to the cath lab and it was noted that her  LAD stent was widely patent. She was noted to have non obstructive CAD and patent stent in the LAD.    She did have intermittent chest soreness which was reproducible and appeared to be muscular skeletal in nature.   She felt comfortable going home on the day of d/c.  Her other chronic conditions were stable during her brief stay.       HTN:  bp was in range during her stay  OSA:  Pt compliant with CPAP  Obesity:  Pt is aware of the need for weight reduction  ( had previous lap band) and not a candidate for gastric sleeve        Consults.  IP CONSULT TO HOSPITALIST  IP CONSULT TO CARDIOLOGY    Physical examination on discharge day.   BP (!) 150/74    Pulse 68    Temp 97.8 ??F (36.6 ??C) (Oral)    Resp 16    Ht 5\' 5"  (1.651 m)    Wt (!) 304 lb (137.9  kg)    SpO2 95%    BMI 50.59 kg/m??   General appearance.  Alert. Looks comfortable. She is obese   HEENT. Sclera clear. Moist mucus membranes.  Cardiovascular. Regular rate and rhythm, normal S1, S2. No murmur.   Respiratory. Not using accessory muscles.Clear to auscultation bilaterally, no wheeze.  Gastrointestinal. Abdomen soft, non-tender, not distended, normal bowel sounds  Neurology. Facial symmetry. No speech deficits. Moving all extremities equally.  Extremities. No edema in lower extremities.  Skin. Warm, dry, normal turgor    Condition at time of discharge stable     Medication instructions provided to patient at discharge.     Medication List      CHANGE how you take these medications    spironolactone 25 MG tablet  Commonly known as:  ALDACTONE  TAKE ONE AND ONE-HALF TABLETS BY MOUTH EVERY DAY  What changed:    ?? how much to take  ?? how to take this  ?? when to take this  ?? additional instructions  Notes to patient:  Use: treat heart failure, fluid retention, lower blood pressure.  Side effects: frequent urination, weakness, muscle cramps, increased sensitivity to light, nausea and dizziness.        CONTINUE taking these medications    acetic acid-hydrocortisone  1-2 % otic solution  Commonly known as:  VOSOL-HC  4 drops in affected ear or ears every 3 hours while awake     albuterol sulfate HFA 108 (90 Base) MCG/ACT inhaler  Commonly known as:  Proventil HFA  Inhale 2 puffs into the lungs every 6 hours as needed for Wheezing  Notes to patient:  Albuterol/pratropium (DuoNeb) or Albuterol (Proventil)  Use: to open airways, reduce secretions  Side effects: nervousness, jitteriness, shakiness, headache, fast heartbeat     aspirin 81 MG EC tablet  Take 1 tablet by mouth daily  Notes to patient:  Thins blood to prevent clotting, ie heart attack or stroke  Side effects: may cause extra bruising or bleeding      CALCIUM-VITAMIN D PO  Notes to patient:  Uses: heartburn, upset stomach, or indigestion  Side effects: constipation, gas, burping     diclofenac sodium 1 % Gel  Commonly known as:  VOLTAREN  Apply 2 g topically 2 times daily  Notes to patient:  Use: reduces substances in the body that cause pain and inflammation.  NSAID  Side effects: diarrhea, constipation, bloating, gas, ulcers     gabapentin 300 MG capsule  Commonly known as:  NEURONTIN  Notes to patient:  Uses: treats nerve pain and seizures   Side effects: Dizziness and drowsiness, wight gain, uncoordinated movements.      lidocaine 5 %  Commonly known as:  Lidoderm  Place 1 patch onto the skin daily 12 hours on, 12 hours off.  Notes to patient:  Use: treats pain   Side effects: severe burning, stinging, irritation where the medicine was applied, swelling or redness, sudden dizziness or drowsiness after application     lisinopril 40 MG tablet  Commonly known as:  PRINIVIL;ZESTRIL  TAKE 1 TABLET BY MOUTH ONE TIME A DAY  Notes to patient:  Uses: treats high blood pressure.   Side effects: dry cough, high potassium level, dizziness, headache.      metoprolol 100 MG tablet  Commonly known as:  LOPRESSOR  Take 1 twice a day  Notes to patient:  Use: decreases the workload of the heart to allow heart to pump easier. Helps  lower heart rate.  Side effects: dizziness, fatigue, low HR     montelukast 10 MG tablet  Commonly known as:  SINGULAIR  TAKE 1 TABLET BY MOUTH ONE TIME A DAY  Notes to patient:  Use: It can treat allergies and prevent asthma attacks.  Side effects: upper respiratory infection, fever, headache, sore throat, cough, stomach pain, diarrhea, earache or ear infection, flu, runny nose, and sinus infection.     MULTIVITAMIN PO  Notes to patient:  Use: treats vitamin deficiencies   Side effects: constipation, diarrhea; nausea, vomiting, heartburn; stomach pain, upset stomach; discolor stools      mupirocin 2 % ointment  Commonly known as:  BACTROBAN  Apply topically  Daily.Apply with Q tip in each naris at night     nitroGLYCERIN 0.4 MG SL tablet  Commonly known as:  NITROSTAT  up to max of 3 total doses. If no relief after 1 dose, call 911.  Notes to patient:  Uses: treats chest pain   Side effects: low blood pressure, significant headache      omeprazole 40 MG delayed release capsule  Commonly known as:  PRILOSEC  TAKE 1 CAPSULE BY MOUTH ONE TIME A DAY  Notes to patient:  Uses: Treats stomach ulcers  Side effects: Headache, Nausea, Vomiting, Stomach pain, Diarrhea, Gas     prasugrel 10 MG Tabs  Commonly known as:  EFFIENT  Take 1 tablet by mouth daily  Notes to patient:  Use: blood thinner   Side effects: Black, tarry stools, bloating or swelling of the face, arms, hands, lower legs, or feet, chest pain or discomfort.     rosuvastatin 10 MG tablet  Commonly known as:  CRESTOR  TAKE ONE TABLET BY MOUTH DAILY  Notes to patient:  Uses: It is used to lower bad cholesterol and raise good cholesterol (HDL), to lower triglycerides, to lower the chance of heart attack, stroke, and certain heart procedures, and to slow the progress of heart disease.    Side effects: muscle cramping, weakness, confusion      torsemide 20 MG tablet  Commonly known as:  DEMADEX  Take 1 tablet by mouth daily as needed (swelling)  Notes to patient:   Use: treat heart failure, fluid retention, lower blood pressure.  Side effects: frequent urination, weakness, muscle cramps, increased sensitivity to light, nausea and dizziness.     triamcinolone 0.1 % cream  Commonly known as:  KENALOG  Apply topically 2 times daily.Apply with Q tip in each ear BID for 1 week.  Notes to patient:  Use: pain, irritation  Side effects: skin irritation, itching     vitamin D 1.25 MG (50000 UT) Caps capsule  Commonly known as:  ERGOCALCIFEROL  Take 1 capsule by mouth once a week  Notes to patient:  Dietary/vitamin supplement  Side effects: discolored urine      vitamin D 50 MCG (2000 UT) Tabs tablet  Commonly known as:  CHOLECALCIFEROL  Take 1 tablet by mouth daily After finishing 12 week couse     zoster recombinant adjuvanted vaccine 50 MCG/0.5ML Susr injection  Commonly known as:  SHINGRIX  .27mL IM X 1 followed by .17mL IM 2-6 months after dose #1     ZyrTEC Allergy 10 MG tablet  Generic drug:  cetirizine  Notes to patient:  Use: treats allergies  Side effects: some drowsiness, excessive tiredness, dry mouth, stomach pain, diarrhea, vomiting.        STOP taking these medications    ciprofloxacin 500 MG tablet  Commonly  known as:  Cipro     methylPREDNISolone 4 MG tablet  Commonly known as:  MEDROL (PAK)     ranolazine 500 MG extended release tablet  Commonly known as:  RANEXA            Discharge recommendations given to patient.  Follow Up.  in 1 week   Disposition. Home   Activity. As tolerated   Diet: DIET CARDIAC;      Spent > 30  minutes in discharge process.    Signed:  Pharmacologist, APRN - CNP     08/10/2019 6:36 AM

## 2019-08-11 ENCOUNTER — Telehealth: Admit: 2019-08-11 | Discharge: 2019-08-11 | Payer: PRIVATE HEALTH INSURANCE | Attending: Family | Primary: Family

## 2019-08-11 ENCOUNTER — Encounter: Attending: Family | Primary: Family

## 2019-08-11 DIAGNOSIS — G4733 Obstructive sleep apnea (adult) (pediatric): Secondary | ICD-10-CM

## 2019-08-11 NOTE — Assessment & Plan Note (Signed)
Chronic- Stable.  Cont meds per PCP and other physicians.

## 2019-08-11 NOTE — Progress Notes (Signed)
Rachael Mays         DOB: 09/20/1956    Diagnosis: [x]  OSA (G47.33) []  CSA (G47.31) []  Apnea (G47.30)   Length of Need: [x]  12 Months []  99 Months []  Other:    Machine (DAW!): [x]  Respironics Dream Station      Auto []  ResMed AirSense     Auto []  Other:     []   CPAP [phone]) []  Bilevel [id number])   Mode: []  Auto []  Spontaneous    Mode: []  Auto []  Spontaneous                            Comfort Settings:   - Ramp Pressure:  cmH2O                                        - Ramp time: 15 min                                     -  Flex/EPR - 3 full time                                    - For ResMed Bilevel (TiMax-4 sec   TiMin- 0.2 sec)     Humidifier: [x]  Heated [id number])        [x]  Water chamber replacement (A7046)/ 1 per 6 months        Mask:   [x]  Nasal [id number]) /1 per 3 months []  Full Face (A7030) /1 per 3 months   [x]  Patient choice -Size and fit mask []  Patient Choice - Size and fit mask   []  Dispense:  []  Dispense:    [x]  Headgear [id number]) / 1 per 3 months []  Headgear [id number]) / 1 per 3 months   [x]  Replacement Nasal Cushion (A7032)/2 per month []  Interface Replacement (A7031)/1 per month   []  Replacement Nasal Pillows (A7033)/2 per month         Tubing: [x]  Heated (A4604)/1 per 3 months    []  Standard (A7037)/1 per 3 months []  Other:           Filters: [x]  Non-disposable (A7039)/1 per 6 months     [x]  Ultra-Fine, Disposable (A7038)/2 per month        Miscellaneous: []  Chin Strap (A7036)/ 1 per 6 months []  O2 bleed-in:       LPM   []  Oximetry on CPAP/Bilevel []   Other:    [x]  Modem: [id number])         Start Order Date: 08/11/19    MEDICAL JUSTIFICATION:  I, the undersigned, certify that the above prescribed supplies are medically necessary for this patient???s wellbeing.  In my opinion, the supplies are both reasonable and necessary in reference to accepted standards of medicalpractice in treatment of this patient???s condition.    Lorriane Shire, APRN - CNP      NPI: [id number]       Order Signed Date:  08/11/19    Electronically signed by Lorriane Shire, APRN - CNP on 08/11/2019 at 12:23 PM

## 2019-08-11 NOTE — Assessment & Plan Note (Signed)
Chronic-Stable.  Encouraged her to work on weight loss through diet and exercise.

## 2019-08-11 NOTE — Assessment & Plan Note (Signed)
Reviewed compliance download with pt.  Supplies and parts as needed for her machine.  These are medically necessary.  Continue medications per her PCP and other physicians. Limit caffeine use after 3pm.  Encouraged her to work on weight loss through diet and exercise.  Diagnoses of Pulmonary hypertension, Morbid obesity with body mass index (BMI) of 45.0 to 49.9 in adult [facility]), Coronary artery disease due to lipid rich plaque, and Chronic diastolic congestive heart failure ([place]) were pertinent to this visit.  The chronic medical conditions listed are directly related to the primary diagnosis listed above.  The management of the primary diagnosis affects the secondary diagnosis and vice versa.

## 2019-08-11 NOTE — Progress Notes (Signed)
Magdalene River MD, FAASM, FCCP  Jayme Cloud, MSN, RN, CNP     [facility]  [place] [address]  Dept: [phone]  Dept Fax: [phone]  Loc: [facility]  [place] [address]  [phone]    Subjective:     Patient ID: Rachael Mays is a 63 y.o. female.    Chief Complaint   Patient presents with   ??? Sleep Apnea       HPI:      Sleep Medicine Video Visit    Pursuant to the emergency declaration under the [place], 1135 waiver authority and the [employer] Act this Telephone Visit was insisted, with patient's consent, to reduce the patient's risk of exposure to COVID-19 and provide continuity of care for an established patient.    Services were provided through a synchronous discussion over a telephone and/or Video chat to substitute for in-person clinic visit, and coded as such. While patient is at home.      Machine Modem/Download Info:  Compliance (hours/night): 7.5 hrs/night  Download AHI (/hour): 0.6 /HR     Average IPAP Pressure: 9.6 cmH2O  Average EPAP Pressure: 7.2 cmH2O         AUTO BIPAP - Settings ([place])  IPAP Max: 25 cmH2O  EPAP Min: 7 cmH2O  Pressure Support Min: 1  Pressure Support Max: 5             Comfort Settings  Humidity Level (0-8): 2  Flex/EPR (0-3): 3 PAP Mask  Mask Type: Nasal mask     Epworth - Total score: 6    Follow-up :     Last Visit : May 2020      Patient reports the listed chronic Co-morbidities: Pulm HTN, Obesity, CAD, CHF,    are well controlled and stable at this time.     Subjective Health Changes: None      Over Night Oximetry: []  Yes  []  No  [x]  NA []  WNL   Using O2: []  Yes  []  No  [x]  NA   Patient is compliant with the machine  [x]  Yes  []  No   Feeling rested when using the machine   [x]  Yes  []  No      Pressure is comfortable with inspiration and expiration  [x]  Yes  []  No     Noticed changes in pressure   []  Yes  []  No  [x]  NA   Mask is fitting well  [x]  Yes  []  No   Noting Mask Air Leak  [x]  Yes  []  No   Having painful Aerophagia  []  Yes  [x]  No   Nocturia   0-3  per night.   Having  HA upon waking  []  Yes  [x]  No   Dry mouth upon waking   Dry Nose  Dry Eyes  [x]  Yes  []  No   Congestion upon waking   []  Yes  [x]  No    Nose Bleeds  []  Yes  [x]  No   Using Sleep Aides    [x]  NA   Understands how to change humidification and/or tubing temperature for comfort while at home  [x]  Yes  []  No     Difficulties falling asleep  []  Yes  [x]  No  Difficulties staying asleep  []  Yes  [x]  No   Approximate time to bed  2-3am   Approximate wake time  10-11am   Taking Naps  no   If taking naps usual length    [x]  NA   If taking naps using the machine  []  Yes  []  No  [x]  NA []  With and With out    Drowsy when driving  []  Yes  [x]  No     Does patient carry a DOT/CDL  []  Yes  [x]  No     Does patient carry FAA/[employer] License   []  Yes  [x]  No      Any concerns noted with the machine at this time  []  Yes  [x]  No        Diagnosis Orders   1. Obstructive sleep apnea     2. Pulmonary hypertension     3. Morbid obesity with body mass index (BMI) of 45.0 to 49.9 in adult ([place])     4. Coronary artery disease due to lipid rich plaque     5. Chronic diastolic congestive heart failure (HCC)         The chronic medical conditions listed are directly related to the primary diagnosis listed above.  The management of the primary diagnosis affects the secondary diagnosis and vice versa.      Assessment/Plan:     Pulmonary hypertension  Chronic- Stable.  Cont meds per PCP and other physicians.      Morbid obesity with body mass index (BMI) of 45.0 to 49.9 in adult (HCC)  Chronic-Stable.  Encouraged her to work on weight loss through diet and exercise.      Coronary artery disease due to lipid rich plaque  Chronic- Stable.  Cont meds per PCP and  other physicians.      Chronic diastolic congestive heart failure (HCC)  Chronic- Stable.  Cont meds per PCP and other physicians.      Obstructive sleep apnea  Reviewed compliance download with pt.  Supplies and parts as needed for her machine.  These are medically necessary.  Continue medications per her PCP and other physicians. Limit caffeine use after 3pm.  Encouraged her to work on weight loss through diet and exercise.  Diagnoses of Pulmonary hypertension, Morbid obesity with body mass index (BMI) of 45.0 to 49.9 in adult [facility]), Coronary artery disease due to lipid rich plaque, and Chronic diastolic congestive heart failure ([place]) were pertinent to this visit.  The chronic medical conditions listed are directly related to the primary diagnosis listed above.  The management of the primary diagnosis affects the secondary diagnosis and vice versa.        The primary encounter diagnosis was Obstructive sleep apnea. Diagnoses of Pulmonary hypertension, Morbid obesity with body mass index (BMI) of 45.0 to 49.9 in adult [facility]), Coronary artery disease due to lipid rich plaque, and Chronic diastolic congestive heart failure ([place]) were also pertinent to this visit.  The chronic medical conditions listed are directly related to the primary diagnosis listed above.  The management of the primary diagnosis affects the secondary diagnosis and vice versa.     - Educated patient and reviewed compliance download with pt.    -Supplies and parts as needed for her machine, these are medically necessary.    - Patient using [employer] for supplies  -Continue medications per her PCP and other physicians.   -Limit caffeine use after 3pm.    -Encouraged her to work on weight loss  through diet and exercise.  -  Patient able to access video feed. Visit completed via video chat communications. 20 min spent with patient.   -F/U: 12 month.      No orders of the defined types were placed in this encounter.      No orders of the defined  types were placed in this encounter.      No orders of the defined types were placed in this encounter.      Jayme Cloud, MSN, RN, CNP

## 2019-08-17 ENCOUNTER — Encounter: Attending: Family | Primary: Family

## 2019-08-18 ENCOUNTER — Encounter: Attending: Family | Primary: Family

## 2019-08-20 ENCOUNTER — Encounter

## 2019-08-21 MED ORDER — GABAPENTIN 300 MG PO CAPS
300 MG | ORAL_CAPSULE | Freq: Every evening | ORAL | 0 refills | Status: DC
Start: 2019-08-21 — End: 2019-09-25

## 2019-08-21 MED ORDER — OMEPRAZOLE 40 MG PO CPDR
40 MG | ORAL_CAPSULE | ORAL | 2 refills | Status: DC
Start: 2019-08-21 — End: 2019-08-28

## 2019-08-21 MED ORDER — MONTELUKAST SODIUM 10 MG PO TABS
10 MG | ORAL_TABLET | ORAL | 2 refills | Status: DC
Start: 2019-08-21 — End: 2019-12-13

## 2019-08-21 NOTE — Telephone Encounter (Signed)
Pt called, needs a refill of gabapentin (NEURONTIN) 300 MG capsule. States she has enough to last her until Saturday 9/19. Pt uses [facility] on [address].

## 2019-08-21 NOTE — Telephone Encounter (Signed)
Med pended.

## 2019-08-22 MED ORDER — ROSUVASTATIN CALCIUM 10 MG PO TABS
10 MG | ORAL_TABLET | ORAL | 0 refills | Status: DC
Start: 2019-08-22 — End: 2019-08-29

## 2019-08-27 ENCOUNTER — Encounter

## 2019-08-28 MED ORDER — OMEPRAZOLE 40 MG PO CPDR
40 MG | ORAL_CAPSULE | ORAL | 1 refills | Status: DC
Start: 2019-08-28 — End: 2020-01-15

## 2019-08-28 MED ORDER — LISINOPRIL 40 MG PO TABS
40 MG | ORAL_TABLET | ORAL | 1 refills | Status: DC
Start: 2019-08-28 — End: 2020-02-27

## 2019-08-28 NOTE — Telephone Encounter (Signed)
 Patient scheduled for 09/01/19 at 10:15am.

## 2019-08-28 NOTE — Telephone Encounter (Signed)
Dr [name] patient  Pt lvm c/b [phone]  Pt stated  Had melanoma taken off back now there is two more spots around the area on her back. Patient wanted to know if meier wanted to see her sooner to have them taken off. Her next off day is Friday.  Please c/b to discuss

## 2019-08-28 NOTE — Telephone Encounter (Signed)
Last OV: 07/13/19  Last Labs:08/09/19  Next appt:09/20/19  Last EKG: 08/08/19

## 2019-08-28 NOTE — Telephone Encounter (Signed)
 Called and left message for patient to call back office.     Please offer her 09/01/19 @ 10:15am if still available.

## 2019-08-29 MED ORDER — ROSUVASTATIN CALCIUM 10 MG PO TABS
10 MG | ORAL_TABLET | ORAL | 0 refills | Status: DC
Start: 2019-08-29 — End: 2019-10-17

## 2019-08-30 ENCOUNTER — Encounter: Attending: Otolaryngology | Primary: Family

## 2019-08-30 ENCOUNTER — Encounter: Attending: Audiologist | Primary: Family

## 2019-09-01 ENCOUNTER — Ambulatory Visit: Admit: 2019-09-01 | Discharge: 2019-09-01 | Payer: PRIVATE HEALTH INSURANCE | Attending: Dermatology | Primary: Family

## 2019-09-01 DIAGNOSIS — B360 Pityriasis versicolor: Secondary | ICD-10-CM

## 2019-09-01 MED ORDER — KETOCONAZOLE 2 % EX CREA
2 % | CUTANEOUS | 1 refills | Status: DC
Start: 2019-09-01 — End: 2019-11-01

## 2019-09-01 NOTE — Progress Notes (Signed)
[facility]  Antonietta Breach, MD  [place]  Apr 06, 1956    63 y.o. female     Date of Visit: 09/01/2019    Chief Complaint: skin lesions    History of Present Illness:    1.  She presents today for 2 newly noted lesions on the right upper back.    2.  She also complains of a persistent asymptomatic lesion on the left breast.      Review of Systems:  None.    Past Medical History, Family History, Surgical History, Medications and Allergies reviewed.    Past Medical History:   Diagnosis Date   ??? Anxiety    ??? Asthma    ??? Blood circulation, collateral    ??? CAD (coronary artery disease)    ??? CHF (congestive heart failure) ([place])    ??? Chronic back pain    ??? Deep vein thrombosis    ??? 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 Depression    ??? GERD (gastroesophageal reflux disease)     NO LONGER SINCE LAP   ??? GERD (gastroesophageal reflux disease) 01/23/2009   ??? Hx of blood clots    ??? Hyperlipidemia     hx; resolved with lap band   ??? Hypertension    ??? Idiopathic peripheral neuropathy 03/26/2017   ??? Melanoma ([place])     upper back   ??? Obesity     hx of; had lap band   ??? Obstructive sleep apnea 09/18/2015     Updating Deprecated Diagnoses   ??? Obstructive sleep apnea (adult) (pediatric) 09/18/2015   ??? Pulmonary embolism [facility])      Past Surgical History:   Procedure Laterality Date   ??? BACK SURGERY      neck  plates   ??? BREAST SURGERY      left lumpectomy   ??? CESAREAN SECTION     ??? CHOLECYSTECTOMY     ??? COLONOSCOPY     ??? COLONOSCOPY  04/08/10   ??? CORONARY ANGIOPLASTY     ??? DIAGNOSTIC CARDIAC CATH LAB PROCEDURE     ??? DILATATION, ESOPHAGUS     ??? ENDOSCOPY, COLON, DIAGNOSTIC     ??? HYSTERECTOMY, VAGINAL     ??? LAP BAND  05/01/08    Dr. Nicole Cella   ??? OTHER SURGICAL HISTORY      [place] Filter: curently in place   ??? OTHER SURGICAL HISTORY  07/05/13    lap band removal   ??? SHOULDER SURGERY     ??? SKIN CANCER EXCISION  12/29/2017   ??? UPPER GASTROINTESTINAL ENDOSCOPY  05/19/13   ??? UPPER  GASTROINTESTINAL ENDOSCOPY  07/21/13    ESOPHAGEAL STENT PLACEMENT   ??? UPPER GASTROINTESTINAL ENDOSCOPY N/A 07/31/2014    Esophagogastroduodenoscopy with esophageal balloon dilation   ??? VASCULAR SURGERY  04/2015    Lucendia Herrlich, celiac artery angiogram, normal abd arteries       Allergies   Allergen Reactions   ??? Adhesive Tape      Local itching   ??? Seasonal    ??? Talwin [Pentazocine] Other (See Comments)     dizzy     Outpatient Medications Marked as Taking for the 09/01/19 encounter (Office Visit) with Francisca December, MD   Medication Sig Dispense Refill   ??? calcium-vitamin D (CALCIUM 500/D) 500-200 MG-UNIT per tablet Take 1 tablet by mouth daily     ??? ketoconazole (NIZORAL) 2 % cream Apply to the  lesions on the upper back twice daily until improved. 30 g 1   ??? rosuvastatin (CRESTOR) 10 MG tablet TAKE ONE TABLET BY MOUTH DAILY 30 tablet 0   ??? omeprazole (PRILOSEC) 40 MG delayed release capsule TAKE ONE CAPSULE BY MOUTH DAILY 90 capsule 1   ??? lisinopril (PRINIVIL;ZESTRIL) 40 MG tablet TAKE ONE TABLET BY MOUTH DAILY 90 tablet 1   ??? montelukast (SINGULAIR) 10 MG tablet TAKE ONE TABLET BY MOUTH DAILY 30 tablet 2   ??? gabapentin (NEURONTIN) 300 MG capsule Take 2 capsules by mouth nightly for 60 days. 60 capsule 0   ??? acetic acid-hydrocortisone (VOSOL-HC) 1-2 % otic solution 4 drops in affected ear or ears every 3 hours while awake 15 mL 1   ??? [DISCONTINUED] vitamin D (CHOLECALCIFEROL) 50 MCG (2000 UT) TABS tablet Take 1 tablet by mouth daily After finishing 12 week couse 30 tablet 11   ??? albuterol sulfate HFA (PROVENTIL HFA) 108 (90 Base) MCG/ACT inhaler Inhale 2 puffs into the lungs every 6 hours as needed for Wheezing 3 Inhaler 3   ??? metoprolol (LOPRESSOR) 100 MG tablet Take 1 twice a day 270 tablet 1   ??? lidocaine (LIDODERM) 5 % Place 1 patch onto the skin daily 12 hours on, 12 hours off. 30 patch 0   ??? prasugrel (EFFIENT) 10 MG TABS Take 1 tablet by mouth daily 90 tablet 3   ??? torsemide (DEMADEX) 20 MG tablet Take 1 tablet by  mouth daily as needed (swelling) 30 tablet 1   ??? spironolactone (ALDACTONE) 25 MG tablet TAKE ONE AND ONE-HALF TABLETS BY MOUTH EVERY DAY (Patient taking differently: Take 25 mg by mouth daily One tab daily) 135 tablet 1   ??? diclofenac sodium 1 % GEL Apply 2 g topically 2 times daily 1 Tube 3   ??? zoster recombinant adjuvanted vaccine (SHINGRIX) 50 MCG SUSR injection .57mL IM X 1 followed by .15mL IM 2-6 months after dose #1 0.5 mL 0   ??? aspirin 81 MG EC tablet Take 1 tablet by mouth daily 30 tablet 11   ??? triamcinolone (KENALOG) 0.1 % cream Apply topically 2 times daily.Apply with Q tip in each ear BID for 1 week. 15 g 1   ??? mupirocin (BACTROBAN) 2 % ointment Apply topically  Daily.Apply with Q tip in each naris at night 15 g 0   ??? Multiple Vitamins-Minerals (MULTIVITAMIN PO) Take 1 tablet by mouth daily      ??? CALCIUM-VITAMIN D PO Take 1 tablet by mouth daily      ??? cetirizine (ZYRTEC ALLERGY) 10 MG tablet Take 10 mg by mouth daily.           Physical Examination       Well appearing.    1.  Right upper back - 2 round mildly scaly pink patches.     2.  Inferior aspect of the left breast with a stuck on appearing verrucous brown papule.        Assessment and Plan     1. Tinea versicolor     Nizoral cream twice daily until improved.      2. SK (seborrheic keratosis)     Reassurance.

## 2019-09-08 NOTE — Telephone Encounter (Signed)
Need a reason for referral.

## 2019-09-08 NOTE — Telephone Encounter (Signed)
Spoke to pt and she states Dr. Deatra Canter advised her to see a neurologist and she needs a referral from her PCP.

## 2019-09-08 NOTE — Telephone Encounter (Signed)
-----   Message from [place] sent at 09/08/2019  3:14 PM EDT -----  Subject: Message to Provider    QUESTIONS  Information for Provider? Patient called in regarding service needed for   neurologist. Patient is requesting a referral.  ---------------------------------------------------------------------------  --------------  CALL BACK INFO  What is the best way for the office to contact you? OK to leave message on   voicemail  Preferred Call Back Phone Number? [phone]  ---------------------------------------------------------------------------  --------------  SCRIPT ANSWERS  Relationship to Patient? Self

## 2019-09-11 NOTE — Telephone Encounter (Signed)
Pt states she have L sciatica nerve pain and states sometimes her legs gets numb and that started after a fall.

## 2019-09-11 NOTE — Telephone Encounter (Signed)
I will place a neurology referral if she wishes but I can see her for sciatica and we can discuss a treatment plan.

## 2019-09-12 ENCOUNTER — Encounter: Attending: Family | Primary: Family

## 2019-09-12 NOTE — Telephone Encounter (Signed)
No answer and no vm.

## 2019-09-14 ENCOUNTER — Encounter

## 2019-09-14 NOTE — Telephone Encounter (Signed)
Please advise

## 2019-09-15 ENCOUNTER — Ambulatory Visit
Admit: 2019-09-15 | Discharge: 2019-09-15 | Payer: PRIVATE HEALTH INSURANCE | Attending: Nurse Practitioner | Primary: Family

## 2019-09-15 DIAGNOSIS — M5432 Sciatica, left side: Secondary | ICD-10-CM

## 2019-09-15 NOTE — Progress Notes (Signed)
lumbarSUBJECTIVE:    Patient ID: Rachael Mays is a 63 y.o. female.    CC: Back pain, sciatica    HPI: The patient presents to the office for an acute visit.    She reports having a history of posterior left leg pain for about 1 year after falling down her basement stairs.  She had an EMG for this.  However, of late, this is been worsening with pain radiating down the left buttock and leg.    She denies any loss of bowel or bladder.  Pain is worse with ambulation and sitting for extended periods of time.  Pain improves with lying flat.  She is taking anti-inflammatories.        Current Outpatient Medications   Medication Sig Dispense Refill   ??? calcium-vitamin D (CALCIUM 500/D) 500-200 MG-UNIT per tablet Take 1 tablet by mouth daily     ??? ketoconazole (NIZORAL) 2 % cream Apply to the lesions on the upper back twice daily until improved. 30 g 1   ??? rosuvastatin (CRESTOR) 10 MG tablet TAKE ONE TABLET BY MOUTH DAILY 30 tablet 0   ??? omeprazole (PRILOSEC) 40 MG delayed release capsule TAKE ONE CAPSULE BY MOUTH DAILY 90 capsule 1   ??? lisinopril (PRINIVIL;ZESTRIL) 40 MG tablet TAKE ONE TABLET BY MOUTH DAILY 90 tablet 1   ??? montelukast (SINGULAIR) 10 MG tablet TAKE ONE TABLET BY MOUTH DAILY 30 tablet 2   ??? gabapentin (NEURONTIN) 300 MG capsule Take 2 capsules by mouth nightly for 60 days. 60 capsule 0   ??? acetic acid-hydrocortisone (VOSOL-HC) 1-2 % otic solution 4 drops in affected ear or ears every 3 hours while awake 15 mL 1   ??? albuterol sulfate HFA (PROVENTIL HFA) 108 (90 Base) MCG/ACT inhaler Inhale 2 puffs into the lungs every 6 hours as needed for Wheezing 3 Inhaler 3   ??? metoprolol (LOPRESSOR) 100 MG tablet Take 1 twice a day 270 tablet 1   ??? lidocaine (LIDODERM) 5 % Place 1 patch onto the skin daily 12 hours on, 12 hours off. 30 patch 0   ??? nitroGLYCERIN (NITROSTAT) 0.4 MG SL tablet up to max of 3 total doses. If no relief after 1 dose, call 911. 25 tablet 3   ??? prasugrel (EFFIENT) 10 MG TABS Take 1 tablet by  mouth daily 90 tablet 3   ??? torsemide (DEMADEX) 20 MG tablet Take 1 tablet by mouth daily as needed (swelling) 30 tablet 1   ??? spironolactone (ALDACTONE) 25 MG tablet TAKE ONE AND ONE-HALF TABLETS BY MOUTH EVERY DAY (Patient taking differently: Take 25 mg by mouth daily One tab daily) 135 tablet 1   ??? diclofenac sodium 1 % GEL Apply 2 g topically 2 times daily 1 Tube 3   ??? aspirin 81 MG EC tablet Take 1 tablet by mouth daily 30 tablet 11   ??? triamcinolone (KENALOG) 0.1 % cream Apply topically 2 times daily.Apply with Q tip in each ear BID for 1 week. 15 g 1   ??? mupirocin (BACTROBAN) 2 % ointment Apply topically  Daily.Apply with Q tip in each naris at night 15 g 0   ??? Multiple Vitamins-Minerals (MULTIVITAMIN PO) Take 1 tablet by mouth daily      ??? cetirizine (ZYRTEC ALLERGY) 10 MG tablet Take 10 mg by mouth daily.       No current facility-administered medications for this visit.           Review of Systems   Gastrointestinal: Negative.  Genitourinary: Negative.    Musculoskeletal: Positive for back pain and gait problem.         OBJECTIVE:  Physical Exam  Constitutional:       Appearance: Normal appearance.   HENT:      Head: Normocephalic and atraumatic.   Pulmonary:      Effort: Pulmonary effort is normal. No respiratory distress.   Skin:     General: Skin is warm and dry.   Neurological:      General: No focal deficit present.      Mental Status: She is alert and oriented to person, place, and time.   Psychiatric:         Mood and Affect: Mood normal.         Behavior: Behavior normal.        BP 118/66    Pulse 60    Temp 98.8 ??F (37.1 ??C) (Temporal)    Wt (!) 315 lb (142.9 kg)    BMI 52.42 kg/m??      PHQ Scores 04/09/2016 02/01/2014   PHQ2 Score 4 3   PHQ9 Score 4 18     Interpretation of Total Score Depression Severity: 1-4 = Minimal depression, 5-9 = Mild depression, 10-14 = Moderate depression, 15-19 = Moderately severe depression, 20-27 =Severe depression        ASSESSMENT/PLAN:  Rachael Mays was seen today for  other.    Diagnoses and all orders for this visit:    Left sided sciatica  -     [facility] [place]  -     XR LUMBAR SPINE (2-3 VIEWS); Future    Lumbar nerve root impingement  -      Physical Therapy - [place]  -     XR LUMBAR SPINE (2-3 VIEWS); Future    Need for influenza vaccination  -     INFLUENZA, QUADV, 3 YRS AND OLDER, IM PF, PREFILL SYR OR SDV, 0.5ML (AFLURIA QUADV, PF)          Almira Coaster, APRN - CNP

## 2019-09-18 ENCOUNTER — Ambulatory Visit
Admit: 2019-09-18 | Discharge: 2019-09-18 | Payer: PRIVATE HEALTH INSURANCE | Attending: Nurse Practitioner | Primary: Family

## 2019-09-18 DIAGNOSIS — Z6841 Body Mass Index (BMI) 40.0 and over, adult: Secondary | ICD-10-CM

## 2019-09-18 MED ORDER — SAXENDA 18 MG/3ML SC SOPN
18 | SUBCUTANEOUS | 5 refills | Status: DC
Start: 2019-09-18 — End: 2019-10-06

## 2019-09-19 NOTE — Telephone Encounter (Signed)
Pt is taking one and half tab daily.

## 2019-09-19 NOTE — Progress Notes (Signed)
SUBJECTIVE:    Patient ID: Rachael Mays is a 63 y.o. female.    CC: Obesity, prediabetes    HPI: The patient has a history of obesity and prediabetes.  She is tried a number of diets over the years without lasting improvement.  She had a lap band in the past which eroded.  She has continued to struggle with weight and her weight is worsening.  She now feels her weight has become detrimental to her health.  She has a number of chronic medical conditions which are likely exacerbated by her weight including prediabetes, hypertension, congestive heart failure, coronary artery disease, chronic back pain.  She is interested in discussing weight loss options.      Past Medical History:   Diagnosis Date   ??? Anxiety    ??? Asthma    ??? Blood circulation, collateral    ??? CAD (coronary artery disease)    ??? CHF (congestive heart failure) ([place])    ??? Chronic back pain    ??? Deep vein thrombosis    ??? 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 Depression    ??? GERD (gastroesophageal reflux disease)     NO LONGER SINCE LAP   ??? GERD (gastroesophageal reflux disease) 01/23/2009   ??? Hx of blood clots    ??? Hyperlipidemia     hx; resolved with lap band   ??? Hypertension    ??? Idiopathic peripheral neuropathy 03/26/2017   ??? Melanoma ([place])     upper back   ??? Obesity     hx of; had lap band   ??? Obstructive sleep apnea 09/18/2015     Updating Deprecated Diagnoses   ??? Obstructive sleep apnea (adult) (pediatric) 09/18/2015   ??? Pulmonary embolism ([facility])         Current Outpatient Medications   Medication Sig Dispense Refill   ??? liraglutide-weight management (SAXENDA) 18 MG/3ML SOPN 0.6 mg subq once daily for 1 week; increase by 0.6 mg daily at weekly intervals to a target dose of 3 mg once daily 12 mL 5   ??? calcium-vitamin D (CALCIUM 500/D) 500-200 MG-UNIT per tablet Take 1 tablet by mouth daily     ??? ketoconazole (NIZORAL) 2 % cream Apply to the lesions on the upper back twice daily until improved. 30 g 1   ??? rosuvastatin (CRESTOR) 10  MG tablet TAKE ONE TABLET BY MOUTH DAILY 30 tablet 0   ??? omeprazole (PRILOSEC) 40 MG delayed release capsule TAKE ONE CAPSULE BY MOUTH DAILY 90 capsule 1   ??? lisinopril (PRINIVIL;ZESTRIL) 40 MG tablet TAKE ONE TABLET BY MOUTH DAILY 90 tablet 1   ??? montelukast (SINGULAIR) 10 MG tablet TAKE ONE TABLET BY MOUTH DAILY 30 tablet 2   ??? gabapentin (NEURONTIN) 300 MG capsule Take 2 capsules by mouth nightly for 60 days. 60 capsule 0   ??? acetic acid-hydrocortisone (VOSOL-HC) 1-2 % otic solution 4 drops in affected ear or ears every 3 hours while awake 15 mL 1   ??? albuterol sulfate HFA (PROVENTIL HFA) 108 (90 Base) MCG/ACT inhaler Inhale 2 puffs into the lungs every 6 hours as needed for Wheezing 3 Inhaler 3   ??? metoprolol (LOPRESSOR) 100 MG tablet Take 1 twice a day 270 tablet 1   ??? lidocaine (LIDODERM) 5 % Place 1 patch onto the skin daily 12 hours on, 12 hours off. 30 patch 0   ??? nitroGLYCERIN (NITROSTAT) 0.4 MG SL tablet up to max of 3 total doses. If  no relief after 1 dose, call 911. 25 tablet 3   ??? prasugrel (EFFIENT) 10 MG TABS Take 1 tablet by mouth daily 90 tablet 3   ??? torsemide (DEMADEX) 20 MG tablet Take 1 tablet by mouth daily as needed (swelling) 30 tablet 1   ??? spironolactone (ALDACTONE) 25 MG tablet TAKE ONE AND ONE-HALF TABLETS BY MOUTH EVERY DAY (Patient taking differently: Take 25 mg by mouth daily One tab daily) 135 tablet 1   ??? diclofenac sodium 1 % GEL Apply 2 g topically 2 times daily 1 Tube 3   ??? aspirin 81 MG EC tablet Take 1 tablet by mouth daily 30 tablet 11   ??? triamcinolone (KENALOG) 0.1 % cream Apply topically 2 times daily.Apply with Q tip in each ear BID for 1 week. 15 g 1   ??? mupirocin (BACTROBAN) 2 % ointment Apply topically  Daily.Apply with Q tip in each naris at night 15 g 0   ??? Multiple Vitamins-Minerals (MULTIVITAMIN PO) Take 1 tablet by mouth daily      ??? cetirizine (ZYRTEC ALLERGY) 10 MG tablet Take 10 mg by mouth daily.       No current facility-administered medications for this  visit.            Review of Systems   Constitutional: Negative.    Respiratory: Positive for shortness of breath.    Cardiovascular: Negative.    Gastrointestinal: Negative.    Endocrine:        Obesity concerns   Musculoskeletal: Positive for back pain.   All other systems reviewed and are negative.        OBJECTIVE:  Physical Exam  Constitutional:       Appearance: Normal appearance.   HENT:      Head: Normocephalic and atraumatic.   Pulmonary:      Effort: Pulmonary effort is normal. No respiratory distress.   Skin:     General: Skin is warm and dry.   Neurological:      General: No focal deficit present.      Mental Status: She is alert and oriented to person, place, and time.   Psychiatric:         Mood and Affect: Mood normal.         Behavior: Behavior normal.        BP 136/78    Pulse 62    Temp 97.3 ??F (36.3 ??C) (Temporal)    Wt (!) 319 lb (144.7 kg)    BMI 53.08 kg/m??      PHQ Scores 04/09/2016 02/01/2014   PHQ2 Score 4 3   PHQ9 Score 4 18     Interpretation of Total Score Depression Severity: 1-4 = Minimal depression, 5-9 = Mild depression, 10-14 = Moderate depression, 15-19 = Moderately severe depression, 20-27 =Severe depression        ASSESSMENT/PLAN:  Rachael Mays was seen today for other.    Diagnoses and all orders for this visit:    Class 3 severe obesity due to excess calories with serious comorbidity and body mass index (BMI) of 50.0 to 59.9 in adult ([place])  -     liraglutide-weight management (SAXENDA) 18 MG/3ML SOPN; 0.6 mg subq once daily for 1 week; increase by 0.6 mg daily at weekly intervals to a target dose of 3 mg once daily    Impaired fasting glucose  -     liraglutide-weight management (SAXENDA) 18 MG/3ML SOPN; 0.6 mg subq once daily for 1 week; increase by 0.6  mg daily at weekly intervals to a target dose of 3 mg once daily    -Patient with a history of severe obesity with multiple risk factors.  She has tried many treatments for obesity without improvement.  She has had a LAP-BAND which  eroded.  She is not interested in trying surgical techniques again at this time.  We discussed options and she would like to try Saxenda.  Given her prediabetes this seems like a reasonable treatment plan as this could also help with her diabetes management.        Almira Coaster, APRN - CNP

## 2019-09-20 ENCOUNTER — Encounter: Attending: Adult Health | Primary: Family

## 2019-09-21 MED ORDER — SPIRONOLACTONE 25 MG PO TABS
25 MG | ORAL_TABLET | ORAL | 0 refills | Status: DC
Start: 2019-09-21 — End: 2019-11-06

## 2019-09-21 NOTE — Telephone Encounter (Signed)
Patient states liraglutide-weight management (SAXENDA) 18 MG/3ML SOPN needs prior auth.

## 2019-09-22 NOTE — Telephone Encounter (Signed)
Pt informed.

## 2019-09-22 NOTE — Telephone Encounter (Signed)
PA was faxed to ins. With clinicals .  The patient's plan does not cover medications for weight loss.  Case ID [id number]

## 2019-09-25 MED ORDER — GABAPENTIN 300 MG PO CAPS
300 MG | ORAL_CAPSULE | ORAL | 5 refills | Status: DC
Start: 2019-09-25 — End: 2020-01-03

## 2019-10-06 ENCOUNTER — Inpatient Hospital Stay: Payer: PRIVATE HEALTH INSURANCE | Primary: Family

## 2019-10-06 ENCOUNTER — Ambulatory Visit: Admit: 2019-10-06 | Discharge: 2019-10-06 | Payer: PRIVATE HEALTH INSURANCE | Attending: Adult Health | Primary: Family

## 2019-10-06 ENCOUNTER — Inpatient Hospital Stay: Admit: 2019-10-06 | Payer: PRIVATE HEALTH INSURANCE | Primary: Family

## 2019-10-06 DIAGNOSIS — I2510 Atherosclerotic heart disease of native coronary artery without angina pectoris: Secondary | ICD-10-CM

## 2019-10-06 DIAGNOSIS — M5416 Radiculopathy, lumbar region: Secondary | ICD-10-CM

## 2019-10-06 MED ORDER — RANOLAZINE ER 1000 MG PO TB12
1000 | ORAL_TABLET | Freq: Two times a day (BID) | ORAL | 5 refills | Status: DC
Start: 2019-10-06 — End: 2020-04-03

## 2019-10-06 NOTE — Patient Instructions (Signed)
Increase Ranexa to 1000 mg twice a day to help with chest pain     appt in 3-4 months

## 2019-10-06 NOTE — Progress Notes (Signed)
[place]     Outpatient Follow Up Note    ZAELA PROVENCHER is 63 y.o. female who presents today with a history of CAD??s/p PTCA LAD April '17, d-HF, DVT/PE, s/p Green[place] filter, HTN??and hyperlipidemia.    ??  She has been experiencing chronic chest pain and shortness of breath since   (03/2016) RHC was also performed which showed PA mean 33 mmHg, PCWP 18 mmHg (transpulmonary gradient 15)??Symptoms have not improved post stent.   Repeat LHC (07/2016) for shortness of breath and chest pain demonstrated patent stent LAD.   She was seen in office 01/11/2017 by Dr Jacqualyn Posey and imipramine was to start for chronic chest pain.??imipramine in women with chronic chest pain (Syndrome X)    Interval hx: 9/1 - 08/10/19:   presents to the ED with CP in the center of chest that began yesterday while getting ready for work. Pt had stent placed in 2019, pt states she got nervous when she started to N/D this evening with increased CP and SOB this afternoon??  She had undergone a cardiac cath on 9/2 showing a patent stent in the LAD and jailed diag-1 (no change)    CHIEF COMPLAINT / HPI:  Follow Up secondary to coronary artery disease    Subjective:    She has CP on/off brought on by nothing in particular. Her shoulders hurt more when she's having CP. The duration varies and goes away with either rest or NTG SL 1-2.   There is DOE. The patient denies orthopnea/PND. She uses a BiPAP and follows with pul regularly. She sleeps good but wakes up feeling poorly. The patient does not have swelling but has gotten it in her LLE. She takes her diuretic on her off days from work making 2 of 7 days a week. The patients weight is up 7# / 2 months by office scales . The patient is not experiencing palpitations or dizziness.   She'd been feeling drained taking a higher dose of metoprolol. It was decreased in August which had helped.  She has pain in her Lt buttock falling on the steps a year ago. Her leg is numb. She's to have imaging followed by  physical therapy.     These symptoms are stable since the last OV. Her BP is always perfect   With regard to medication therapy the patient has been compliant with prescribed regimen. They have tolerated therapy to date.     Past Medical History:   Diagnosis Date   ??? Anxiety    ??? Asthma    ??? Blood circulation, collateral    ??? CAD (coronary artery disease)    ??? CHF (congestive heart failure) ([place])    ??? Chronic back pain    ??? Deep vein thrombosis    ??? 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 Depression    ??? GERD (gastroesophageal reflux disease)     NO LONGER SINCE LAP   ??? GERD (gastroesophageal reflux disease) 01/23/2009   ??? Hx of blood clots    ??? Hyperlipidemia     hx; resolved with lap band   ??? Hypertension    ??? Idiopathic peripheral neuropathy 03/26/2017   ??? Melanoma ([place])     upper back   ??? Obesity     hx of; had lap band   ??? Obstructive sleep apnea 09/18/2015     Updating Deprecated Diagnoses   ??? Obstructive sleep apnea (adult) (pediatric) 09/18/2015   ??? Pulmonary embolism ([place])  Social History:    Social History     Tobacco Use   Smoking Status Former Smoker   ??? Packs/day: 0.50   ??? Years: 40.00   ??? Pack years: 20.00   ??? Types: Cigarettes   ??? Last attempt to quit: 08/07/2012   ??? Years since quitting: 7.1   Smokeless Tobacco Former User   ??? Quit date: 06/16/2013   Tobacco Comment    started to smoke at age 59 / only smoked 0.5 p.p.d      Current Medications:  Current Outpatient Medications   Medication Sig Dispense Refill   ??? gabapentin (NEURONTIN) 300 MG capsule TAKE TWO CAPSULES BY MOUTH ONCE NIGHTLY 60 capsule 5   ??? spironolactone (ALDACTONE) 25 MG tablet TAKE ONE AND ONE-HALF TABLET BY MOUTH DAILY 38 tablet 0   ??? calcium-vitamin D (CALCIUM 500/D) 500-200 MG-UNIT per tablet Take 1 tablet by mouth daily     ??? ketoconazole (NIZORAL) 2 % cream Apply to the lesions on the upper back twice daily until improved. 30 g 1   ??? rosuvastatin (CRESTOR) 10 MG tablet TAKE ONE TABLET BY MOUTH DAILY 30 tablet  0   ??? omeprazole (PRILOSEC) 40 MG delayed release capsule TAKE ONE CAPSULE BY MOUTH DAILY 90 capsule 1   ??? lisinopril (PRINIVIL;ZESTRIL) 40 MG tablet TAKE ONE TABLET BY MOUTH DAILY 90 tablet 1   ??? montelukast (SINGULAIR) 10 MG tablet TAKE ONE TABLET BY MOUTH DAILY 30 tablet 2   ??? acetic acid-hydrocortisone (VOSOL-HC) 1-2 % otic solution 4 drops in affected ear or ears every 3 hours while awake 15 mL 1   ??? albuterol sulfate HFA (PROVENTIL HFA) 108 (90 Base) MCG/ACT inhaler Inhale 2 puffs into the lungs every 6 hours as needed for Wheezing 3 Inhaler 3   ??? metoprolol (LOPRESSOR) 100 MG tablet Take 1 twice a day 270 tablet 1   ??? lidocaine (LIDODERM) 5 % Place 1 patch onto the skin daily 12 hours on, 12 hours off. 30 patch 0   ??? nitroGLYCERIN (NITROSTAT) 0.4 MG SL tablet up to max of 3 total doses. If no relief after 1 dose, call 911. 25 tablet 3   ??? prasugrel (EFFIENT) 10 MG TABS Take 1 tablet by mouth daily 90 tablet 3   ??? torsemide (DEMADEX) 20 MG tablet Take 1 tablet by mouth daily as needed (swelling) 30 tablet 1   ??? diclofenac sodium 1 % GEL Apply 2 g topically 2 times daily 1 Tube 3   ??? aspirin 81 MG EC tablet Take 1 tablet by mouth daily 30 tablet 11   ??? triamcinolone (KENALOG) 0.1 % cream Apply topically 2 times daily.Apply with Q tip in each ear BID for 1 week. 15 g 1   ??? mupirocin (BACTROBAN) 2 % ointment Apply topically  Daily.Apply with Q tip in each naris at night 15 g 0   ??? Multiple Vitamins-Minerals (MULTIVITAMIN PO) Take 1 tablet by mouth daily      ??? cetirizine (ZYRTEC ALLERGY) 10 MG tablet Take 10 mg by mouth daily.       No current facility-administered medications for this visit.      REVIEW OF SYSTEMS:    CONSTITUTIONAL: No major weight gain or loss, fatigue, weakness, night sweats or fever.  HEENT: No new vision difficulties or ringing in the ears.  RESPIRATORY: No new SOB, PND, orthopnea or cough.   CARDIOVASCULAR: See HPI  GI: No nausea, vomiting, diarrhea, constipation, abdominal pain or changes  in bowel  habits.  GU: No urinary frequency, urgency, incontinence hematuria or dysuria.  SKIN: No cyanosis or skin lesions.  MUSCULOSKELETAL: No new muscle or joint pain.  NEUROLOGICAL: No syncope or TIA-like symptoms.  PSYCHIATRIC: No anxiety, pain, insomnia or depression    Objective:   PHYSICAL EXAM:        VITALS:  BP 130/70 (Site: Left Upper Arm, Position: Sitting, Cuff Size: Large Adult)    Pulse 57    Ht 5\' 5"  (1.651 m)    Wt (!) 318 lb (144.2 kg)    SpO2 97%    BMI 52.92 kg/m??   CONSTITUTIONAL: Cooperative, no apparent distress, and appears well nourished / obese  NEUROLOGIC:  Awake and orientated to person, place and time.  PSYCH: Calm affect.  SKIN: Warm and dry.  HEENT: Sclera non-icteric, normocephalic, neck supple, no elevation of JVP, normal carotid pulses with no bruits and thyroid normal size.  LUNGS:  No increased work of breathing and clear to auscultation, no crackles or wheezing  CARDIOVASCULAR:  Regular rate 56 and rhythm with no murmurs, gallops, rubs, or abnormal heart sounds, normal PMI.The apical impulses not displaced  JVP less than 8 cm H2O  Heart tones are crisp and normal  Cervical veins are not engorged  The carotid upstroke is normal in amplitude and contour without delay or bruit  JVP is not elevated  ABDOMEN:  Normal bowel sounds, non-distended and non-tender to palpation  EXT: bil LE edema, no calf tenderness. Pulses are present bilaterally.    DATA:    Lab Results   Component Value Date    ALT 30 07/26/2019    AST 30 07/26/2019    ALKPHOS 87 07/26/2019    BILITOT 0.4 07/26/2019     Lab Results   Component Value Date    CREATININE 1.0 08/10/2019    BUN 14 08/10/2019    NA 139 08/10/2019    K 4.1 08/10/2019    CL 105 08/10/2019    CO2 25 08/10/2019     Lab Results   Component Value Date    TSH 1.37 08/22/2015    T4TOTAL 6.6 04/17/2014     Lab Results   Component Value Date    WBC 7.0 08/08/2019    HGB 14.2 08/08/2019    HCT 42.3 08/08/2019    MCV 89.7 08/08/2019    PLT 157 08/08/2019      No components found for: CHLPL  Lab Results   Component Value Date    TRIG 134 08/09/2019    TRIG 138 07/13/2019    TRIG 114 06/17/2016     Lab Results   Component Value Date    HDL 43 08/09/2019    HDL 44 07/13/2019    HDL 48 03/23/2019     Lab Results   Component Value Date    LDLCALC 80 08/09/2019    LDLCALC 82 07/13/2019    LDLCALC 115 (H) 03/23/2019     Lab Results   Component Value Date    LABVLDL 27 08/09/2019    LABVLDL 28 07/13/2019    LABVLDL 27 03/23/2019     Radiology Review:  Pertinent images / reports were reviewed as a part of this visit and reveals the following:    Last Angiogram: LHC on 08/09/19   ??  Findings/Result ??   LM Normal   LAD Widely patent stent, 50% ostial D1 jailed by stent (no change)   Cx Normal   RI NA   RCA Normal  LVEDP 6   LVG 60%   ??  Intervention:??????????????  None  ??  Post Cath Dx:??????????  Nonobstructive CAD, patent stent LAD              ??  Angiogram 05/07/17:  Anatomy:??  LM-normal  LAD-normal, widely patent stent mid  Cx-normal  OM1-??normal  RCA-normal, right dominant  RPDA-??normal  LVEF-??60%  ??  Impression:  1. ??Normal coronary artery anatomy.  2. ??Widely patent mid LAD stent.  3. ??Normal LV systolic function.  4. ??False positive stress test. ??Likely breast artifact.  ??  Plan:  1. ??Medical management.  2. ??She needs workup of noncardiac??causes of chest pain. ??She now has to stress test with similar abnormalities most consistent with breast artifact. ??The majority of her coronary angiograms have demonstrated normal anatomy except for the one that had a 70% mid LAD stenosis which was stented.    myoview stress: 03/29/19:  Summary    ??Normal myocardial perfusion.    ??Normal LV size and systolic function.    ??      Last Echo:??May '17:  Summary  ??Normal left ventricle size and systolic function with an estimated ejection??fraction of 65%.  ??There is mild concentric left ventricular hypertrophy.  ??Mild mitral regurgitation is present.  ??The aortic valve appears sclerotic but opens  well.  ??There is mild tricuspid regurgitation with RVSP estimated at 42 mmHg.  ??Mild pulmonic regurgitation present.  ??Definity was used to better delineate endocardium.    Cardiac Cath 07/17/16:  Anatomy:??  LM-normal  LAD-widely patent stent otherwise normal  Cx-normal, dominant  OM1- normal  RCA-normal  LVEF- 70%, LVEDP 15 mmHg  ??  Impression:  1. ??Widely patent LAD stent otherwise normal coronary arteries  2. ??Hyperdynamic LV systolic function  3. ??Non-cardiac chest pain  ??  R/L Cardiac Cath 03/31/16:  Anatomy:??  LM-normal??  LAD-70% mid with FFR 0.77  Cx-normal, codominant  OM1- normal  RCA-normal  RPDA- normal  LVEF- 70%, LVEDP 19   PCI: LAD 70% to 0% with 3.25 mm x 18 mm Xience Alpine drug-eluting stent    Assessment:      Diagnosis Orders   1. Coronary artery disease involving native coronary artery of native heart without angina pectoris   ~patent stent LAD with 50% ostial diag - jailed and unchanged from previous cath   ~hx of PTCA LAD April '17 ; patent on 3 subsequent cath(s) from Aug '17, '18 and Sept '20  ~DAPT / statin / BB  ~exercise limited with LLE pain  ~has been seen/eval by rheumatology for myofascial pain    2. Chronic diastolic congestive heart failure (HCC)  ~neg to exam for crackles ; wt up 7# / 2 months  ~takes diuretic 2 of 7 days weekly   ~taking spironolactone / lisinopril   ~SOB multifactorial : body habitus, OSA, chronic HF, hx of PE with Greenfield filter    3. Essential hypertension   ~controlled         I had the opportunity to review the clinical symptoms and presentation of CRISTINE MARTINCIC.   Plan:     1. Increase Ranexa to 1000 mg bid with jailed diag branch  2. F/U in 3-4 months    Overall the patient is stable from CV standpoint    I have addresed the patient's cardiac risk factors and adjusted pharmacologic treatment as needed. In addition, I have reinforced the need for patient directed risk factor modification.    Further evaluation will be based  upon the patient's clinical  course and testing results.    All questions and concerns were addressed to the patient. Alternatives to my treatment were discussed.      The patient is not currently smoking. The risks related to smoking were reviewed with the patient. Recommend maintaining a smoke-free lifestyle.     Patient is on a beta-blocker  Patient is on an ace-i  Patient is on a statin    Dual Antiplatelet therapy has been recommended / prescribed for this patient. Education conducted on adverse reactions including bleeding was discussed.    The patient verbalizes understanding not to stop medications without discussing with [place].    Discussed exercise: 30-60 minutes 7 days/week  Discussed Low saturated fat diet.     Thank you for allowing to [place] to participate in the care of AUBRYNN BOSSIE.    Luiz Iron, APRN    Documentation of today's visit sent to PCP

## 2019-10-09 ENCOUNTER — Ambulatory Visit: Admit: 2019-10-09 | Discharge: 2019-10-09 | Payer: PRIVATE HEALTH INSURANCE | Attending: Adult Health | Primary: Family

## 2019-10-09 DIAGNOSIS — S81802A Unspecified open wound, left lower leg, initial encounter: Secondary | ICD-10-CM

## 2019-10-09 MED ORDER — SULFAMETHOXAZOLE-TRIMETHOPRIM 800-160 MG PO TABS
800-160 MG | ORAL_TABLET | Freq: Two times a day (BID) | ORAL | 0 refills | Status: DC
Start: 2019-10-09 — End: 2019-10-11

## 2019-10-09 NOTE — Telephone Encounter (Signed)
Patient called pre-service center [facility]) [place] with red flag complaint.     Brief description of triage: Sore on the back of the left leg that she just noticed today. It is now opened and is draining green pus. States that she has neuropathy in that area so she did not notice right away. Starting one on her abd as well.  See below information for more detail    Triage indicates for patient to be seen today at office or [place] advice provided, patient verbalizes understanding; denies any other questions or concerns; instructed to call back for any new or worsening symptoms.    Writer provided warm transfer to [place] at [facility] for appointment scheduling.    Attention Provider: Thank you for allowing me to participate in the care of your patient. The patient was connected to triage in response to information provided to the ECC. Please do not respond through this encounter as the response is not directed to a shared pool.          Reason for Disposition  ??? Spreading redness around the boil and no fever    Answer Assessment - Initial Assessment Questions  1. APPEARANCE of BOIL: "What does the boil look like?"       Red, opened up and is oozing green and yellow discharge, no odor    2. LOCATION: "Where is the boil located?"       Back of the thigh on the left    3. NUMBER: "How many boils are there?"       One    4. SIZE: "How big is the boil?" (e.g., inches, cm; compare to size of a coin or other object)      Half dollar size, red, painful,     5. ONSET: "When did the boil start?"      Noticed for the first time today. She does have neuropathy in that area so she does not feel much, could have been there for quite some time    6. PAIN: "Is there any pain?" If so, ask: "How bad is the pain?"   (Scale 1-10; or mild, moderate, severe)      5/10 all the time because it is right where she sits    7. FEVER: "Do you have a fever?" If so, ask: "What is it, how was it measured, and when did it start?"       Pt  denies. States no chill or sweats and gets her temp taken at work every day    8. SOURCE: "Have you been around anyone with boils or other Staph infections?" "Have you ever had boils before?"      No    9. OTHER SYMPTOMS: "Do you have any other symptoms?" (e.g., shaking chills, weakness, rash elsewhere on body)      Starting on on the abd on the left side slightly above the belly button    10. PREGNANCY: "Is there any chance you are pregnant?" "When was your last menstrual period?"        No    Protocols used: BOIL (SKIN ABSCESS)-ADULT-[place]

## 2019-10-09 NOTE — Progress Notes (Signed)
10/09/19     Chief Complaint   Patient presents with   ??? Drainage     from left thigh and on her stomach, hurts, just noticied it today in the shower     HPI    Patient is here for an acute visit .  Pt reports she noticed an open wound and drainage today to the back of her left thigh  She reports the wound is painful and red   Denies fever, chills     Allergies   Allergen Reactions   ??? Adhesive Tape      Local itching   ??? Seasonal    ??? Talwin [Pentazocine] Other (See Comments)     dizzy       Current Outpatient Medications   Medication Sig Dispense Refill   ??? sulfamethoxazole-trimethoprim (BACTRIM DS;SEPTRA DS) 800-160 MG per tablet Take 1 tablet by mouth 2 times daily for 7 days 14 tablet 0   ??? ranolazine (RANEXA) 1000 MG extended release tablet Take 1 tablet by mouth 2 times daily 60 tablet 5   ??? Cholecalciferol (VITAMIN D3) 50 MCG (2000 UT) CAPS Take 1 capsule by mouth daily     ??? gabapentin (NEURONTIN) 300 MG capsule TAKE TWO CAPSULES BY MOUTH ONCE NIGHTLY 60 capsule 5   ??? spironolactone (ALDACTONE) 25 MG tablet TAKE ONE AND ONE-HALF TABLET BY MOUTH DAILY 38 tablet 0   ??? calcium-vitamin D (CALCIUM 500/D) 500-200 MG-UNIT per tablet Take 1 tablet by mouth daily     ??? ketoconazole (NIZORAL) 2 % cream Apply to the lesions on the upper back twice daily until improved. 30 g 1   ??? rosuvastatin (CRESTOR) 10 MG tablet TAKE ONE TABLET BY MOUTH DAILY 30 tablet 0   ??? omeprazole (PRILOSEC) 40 MG delayed release capsule TAKE ONE CAPSULE BY MOUTH DAILY 90 capsule 1   ??? lisinopril (PRINIVIL;ZESTRIL) 40 MG tablet TAKE ONE TABLET BY MOUTH DAILY 90 tablet 1   ??? montelukast (SINGULAIR) 10 MG tablet TAKE ONE TABLET BY MOUTH DAILY 30 tablet 2   ??? acetic acid-hydrocortisone (VOSOL-HC) 1-2 % otic solution 4 drops in affected ear or ears every 3 hours while awake 15 mL 1   ??? albuterol sulfate HFA (PROVENTIL HFA) 108 (90 Base) MCG/ACT inhaler Inhale 2 puffs into the lungs every 6 hours as needed for Wheezing 3 Inhaler 3   ??? metoprolol  (LOPRESSOR) 100 MG tablet Take 1 twice a day 270 tablet 1   ??? lidocaine (LIDODERM) 5 % Place 1 patch onto the skin daily 12 hours on, 12 hours off. 30 patch 0   ??? nitroGLYCERIN (NITROSTAT) 0.4 MG SL tablet up to max of 3 total doses. If no relief after 1 dose, call 911. 25 tablet 3   ??? prasugrel (EFFIENT) 10 MG TABS Take 1 tablet by mouth daily 90 tablet 3   ??? torsemide (DEMADEX) 20 MG tablet Take 1 tablet by mouth daily as needed (swelling) 30 tablet 1   ??? diclofenac sodium 1 % GEL Apply 2 g topically 2 times daily 1 Tube 3   ??? aspirin 81 MG EC tablet Take 1 tablet by mouth daily 30 tablet 11   ??? triamcinolone (KENALOG) 0.1 % cream Apply topically 2 times daily.Apply with Q tip in each ear BID for 1 week. 15 g 1   ??? mupirocin (BACTROBAN) 2 % ointment Apply topically  Daily.Apply with Q tip in each naris at night 15 g 0   ??? Multiple Vitamins-Minerals (MULTIVITAMIN PO)  Take 1 tablet by mouth daily      ??? cetirizine (ZYRTEC ALLERGY) 10 MG tablet Take 10 mg by mouth daily.       No current facility-administered medications for this visit.      Review of Systems  Negative other than hpi    Vitals:    10/09/19 1645   BP: 134/76   Pulse: 66   Temp: 97.4 ??F (36.3 ??C)   SpO2: 99%   Weight: (!) 316 lb 3.2 oz (143.4 kg)      Physical Exam  Constitutional:       General: She is not in acute distress.     Appearance: Normal appearance. She is not ill-appearing.   HENT:      Head: Normocephalic and atraumatic.   Pulmonary:      Effort: Pulmonary effort is normal. No respiratory distress.   Skin:     General: Skin is warm and dry.      Findings: Erythema present.      Comments: approx 4x5 cm open wound to back of left thigh.  Wound has surrounding erythema and warmth.  Mild drainage without a fluctuant pocket of fluid.  She does have some the granulation tissue in the wound bed.   Neurological:      General: No focal deficit present.      Mental Status: She is alert and oriented to person, place, and time. Mental status is at  baseline.   Psychiatric:         Mood and Affect: Mood normal.         Behavior: Behavior normal.       Assessment/Plan:  1. Wound of left lower extremity, initial encounter  Stable, uncontrolled  Wound and surrounding wound bed with erythema and warmth  Covering with antibiotics  No fluctuant pocket of infection  Cleanse with warm soapy water  Reviewed wound care  Reviewed criteria for follow-up  - Culture, Wound  Starting Bactrim twice daily    Discussed medications with patient, who voiced understanding of their use and indications. All questions answered.    Return in about 1 week (around 10/16/2019), or if symptoms worsen or fail to improve.      Electronically signed by Brien Mates, APRN - CNP on 10/09/2019 at 4:56 PM

## 2019-10-11 MED ORDER — CLINDAMYCIN HCL 300 MG PO CAPS
300 MG | ORAL_CAPSULE | Freq: Four times a day (QID) | ORAL | 0 refills | Status: DC
Start: 2019-10-11 — End: 2019-10-18

## 2019-10-11 NOTE — Telephone Encounter (Signed)
Spot on back of leg about the size of half dollar that appears green and is hard around the edge. Primary doc gave Bactrim for area and she says it has only gotten worse and her whole leg is aching. Please call patient     [phone]

## 2019-10-11 NOTE — Telephone Encounter (Signed)
Called and spk to pt, states that area is bothersome, rapidly growing, tender, and inflamed.  Scheduled to see dr on Friday, I advised  If area become too painful, to go see urgent care.  Pt  Understands.

## 2019-10-13 ENCOUNTER — Ambulatory Visit: Admit: 2019-10-13 | Discharge: 2019-10-13 | Payer: PRIVATE HEALTH INSURANCE | Attending: Dermatology | Primary: Family

## 2019-10-13 DIAGNOSIS — T300 Burn of unspecified body region, unspecified degree: Secondary | ICD-10-CM

## 2019-10-13 LAB — CULTURE, WOUND: Gram Stain Result: NONE SEEN — AB

## 2019-10-13 MED ORDER — SILVER SULFADIAZINE 1 % EX CREA
1 % | CUTANEOUS | 1 refills | Status: DC
Start: 2019-10-13 — End: 2020-06-19

## 2019-10-13 NOTE — Progress Notes (Signed)
[facility]  Antonietta Breach, MD  [place]  [birth year]    63 y.o. female     Date of Visit: 10/13/2019    Chief Complaint: thigh lesion    History of Present Illness:    She presents today for a 4 day history of a painful lesion on the left thigh.  Appeared after sitting on a hot rice pack for 20 minutes.  Has some preceding numbness from back problems.        She was prescribed a 10 day course of clindamycin on 10/12/2019 but has not taken it yet.      Culture from 10/09/2019 revealed moderate growth of enterococcus species.      Review of Systems:  Gen: no fevers, chills or malaise    Past Medical History, Family History, Surgical History, Medications and Allergies reviewed.    Past Medical History:   Diagnosis Date   ??? Anxiety    ??? Asthma    ??? Blood circulation, collateral    ??? CAD (coronary artery disease)    ??? CHF (congestive heart failure) ([place])    ??? Chronic back pain    ??? Deep vein thrombosis    ??? 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 Depression    ??? GERD (gastroesophageal reflux disease)     NO LONGER SINCE LAP   ??? GERD (gastroesophageal reflux disease) 01/23/2009   ??? Hx of blood clots    ??? Hyperlipidemia     hx; resolved with lap band   ??? Hypertension    ??? Idiopathic peripheral neuropathy 03/26/2017   ??? Melanoma ([place])     upper back   ??? Obesity     hx of; had lap band   ??? Obstructive sleep apnea 09/18/2015     Updating Deprecated Diagnoses   ??? Obstructive sleep apnea (adult) (pediatric) 09/18/2015   ??? Pulmonary embolism [facility])      Past Surgical History:   Procedure Laterality Date   ??? BACK SURGERY      neck  plates   ??? BREAST SURGERY      left lumpectomy   ??? CESAREAN SECTION     ??? CHOLECYSTECTOMY     ??? COLONOSCOPY     ??? COLONOSCOPY  04/08/10   ??? CORONARY ANGIOPLASTY     ??? DIAGNOSTIC CARDIAC CATH LAB PROCEDURE     ??? DILATATION, ESOPHAGUS     ??? ENDOSCOPY, COLON, DIAGNOSTIC     ??? HYSTERECTOMY, VAGINAL     ??? LAP BAND  05/01/08    Dr. Nicole Cella   ??? OTHER  SURGICAL HISTORY      [place] Filter: curently in place   ??? OTHER SURGICAL HISTORY  07/05/13    lap band removal   ??? SHOULDER SURGERY     ??? SKIN CANCER EXCISION  12/29/2017   ??? UPPER GASTROINTESTINAL ENDOSCOPY  05/19/13   ??? UPPER GASTROINTESTINAL ENDOSCOPY  07/21/13    ESOPHAGEAL STENT PLACEMENT   ??? UPPER GASTROINTESTINAL ENDOSCOPY N/A 07/31/2014    Esophagogastroduodenoscopy with esophageal balloon dilation   ??? VASCULAR SURGERY  04/2015    Lucendia Herrlich, celiac artery angiogram, normal abd arteries       Allergies   Allergen Reactions   ??? Adhesive Tape      Local itching   ??? Seasonal    ??? Talwin [Pentazocine] Other (See Comments)     dizzy     Outpatient Medications Marked as Taking  for the 10/13/19 encounter (Office Visit) with Francisca December, MD   Medication Sig Dispense Refill   ??? silver sulfADIAZINE (SILVADENE) 1 % cream Apply topically to the wound on the left thigh daily. 25 g 1   ??? clindamycin (CLEOCIN) 300 MG capsule Take 1 capsule by mouth 4 times daily for 10 days 40 capsule 0   ??? ranolazine (RANEXA) 1000 MG extended release tablet Take 1 tablet by mouth 2 times daily 60 tablet 5   ??? Cholecalciferol (VITAMIN D3) 50 MCG (2000 UT) CAPS Take 1 capsule by mouth daily     ??? gabapentin (NEURONTIN) 300 MG capsule TAKE TWO CAPSULES BY MOUTH ONCE NIGHTLY 60 capsule 5   ??? spironolactone (ALDACTONE) 25 MG tablet TAKE ONE AND ONE-HALF TABLET BY MOUTH DAILY 38 tablet 0   ??? calcium-vitamin D (CALCIUM 500/D) 500-200 MG-UNIT per tablet Take 1 tablet by mouth daily     ??? ketoconazole (NIZORAL) 2 % cream Apply to the lesions on the upper back twice daily until improved. 30 g 1   ??? rosuvastatin (CRESTOR) 10 MG tablet TAKE ONE TABLET BY MOUTH DAILY 30 tablet 0   ??? omeprazole (PRILOSEC) 40 MG delayed release capsule TAKE ONE CAPSULE BY MOUTH DAILY 90 capsule 1   ??? lisinopril (PRINIVIL;ZESTRIL) 40 MG tablet TAKE ONE TABLET BY MOUTH DAILY 90 tablet 1   ??? montelukast (SINGULAIR) 10 MG tablet TAKE ONE TABLET BY MOUTH DAILY 30 tablet 2    ??? acetic acid-hydrocortisone (VOSOL-HC) 1-2 % otic solution 4 drops in affected ear or ears every 3 hours while awake 15 mL 1   ??? albuterol sulfate HFA (PROVENTIL HFA) 108 (90 Base) MCG/ACT inhaler Inhale 2 puffs into the lungs every 6 hours as needed for Wheezing 3 Inhaler 3   ??? metoprolol (LOPRESSOR) 100 MG tablet Take 1 twice a day 270 tablet 1   ??? lidocaine (LIDODERM) 5 % Place 1 patch onto the skin daily 12 hours on, 12 hours off. 30 patch 0   ??? nitroGLYCERIN (NITROSTAT) 0.4 MG SL tablet up to max of 3 total doses. If no relief after 1 dose, call 911. 25 tablet 3   ??? prasugrel (EFFIENT) 10 MG TABS Take 1 tablet by mouth daily 90 tablet 3   ??? torsemide (DEMADEX) 20 MG tablet Take 1 tablet by mouth daily as needed (swelling) 30 tablet 1   ??? diclofenac sodium 1 % GEL Apply 2 g topically 2 times daily 1 Tube 3   ??? aspirin 81 MG EC tablet Take 1 tablet by mouth daily 30 tablet 11   ??? triamcinolone (KENALOG) 0.1 % cream Apply topically 2 times daily.Apply with Q tip in each ear BID for 1 week. 15 g 1   ??? mupirocin (BACTROBAN) 2 % ointment Apply topically  Daily.Apply with Q tip in each naris at night 15 g 0   ??? Multiple Vitamins-Minerals (MULTIVITAMIN PO) Take 1 tablet by mouth daily      ??? cetirizine (ZYRTEC ALLERGY) 10 MG tablet Take 10 mg by mouth daily.             Physical Examination     Vitals:    10/13/19 0751   Temp: 97.7 ??F (36.5 ??C)   TempSrc: Temporal       The following were examined and determined to be normal: Psych/Neuro, Head/face, Conjunctivae/eyelids, Neck and RLE.    The following were examined and determined to be abnormal: LLE.     Well appearing.  Afebrile.  1.  Left mid posterior thigh with a superficially ulcerated focally fibrinous-based ulceration with surrounding induration, warmth and erythema.  There is 1 small pustule to the right of the wound.            Assessment and Plan     1.  Thermal burn with ulcer and cellulitis on the left thigh    Start clindamycin 300 mg 4 times daily  for the next 10 days.  Patient instructed to pick up the prescription.    Silvadene cream daily with dressing changes.    Patient instructed to go the ER if develops fever, develops increasing pain or if the redness or ulceration gets larger.      Return in about 2 weeks (around 10/27/2019).    --Francisca December, MD

## 2019-10-15 ENCOUNTER — Encounter

## 2019-10-16 ENCOUNTER — Encounter: Attending: Nurse Practitioner | Primary: Family

## 2019-10-16 DIAGNOSIS — L03116 Cellulitis of left lower limb: Principal | ICD-10-CM

## 2019-10-16 LAB — CBC WITH AUTO DIFFERENTIAL
Basophils %: 0.7 %
Basophils Absolute: 0 10*3/uL (ref 0.0–0.2)
Eosinophils %: 4.2 %
Eosinophils Absolute: 0.3 10*3/uL (ref 0.0–0.6)
Hematocrit: 39.4 % (ref 36.0–48.0)
Hemoglobin: 12.9 g/dL (ref 12.0–16.0)
Lymphocytes %: 31.3 %
Lymphocytes Absolute: 2 10*3/uL (ref 1.0–5.1)
MCH: 29.6 pg (ref 26.0–34.0)
MCHC: 32.7 g/dL (ref 31.0–36.0)
MCV: 90.7 fL (ref 80.0–100.0)
MPV: 8.4 fL (ref 5.0–10.5)
Monocytes %: 11.1 %
Monocytes Absolute: 0.7 10*3/uL (ref 0.0–1.3)
Neutrophils %: 52.7 %
Neutrophils Absolute: 3.4 10*3/uL (ref 1.7–7.7)
Platelets: 247 10*3/uL (ref 135–450)
RBC: 4.35 M/uL (ref 4.00–5.20)
RDW: 13.9 % (ref 12.4–15.4)
WBC: 6.4 10*3/uL (ref 4.0–11.0)

## 2019-10-16 LAB — COMPREHENSIVE METABOLIC PANEL
ALT: 34 U/L (ref 10–40)
AST: 37 U/L (ref 15–37)
Albumin/Globulin Ratio: 0.9 — ABNORMAL LOW (ref 1.1–2.2)
Albumin: 3.7 g/dL (ref 3.4–5.0)
Alkaline Phosphatase: 105 U/L (ref 40–129)
Anion Gap: 10 (ref 3–16)
BUN: 24 mg/dL — ABNORMAL HIGH (ref 7–20)
CO2: 22 mmol/L (ref 21–32)
Calcium: 10.2 mg/dL (ref 8.3–10.6)
Chloride: 103 mmol/L (ref 99–110)
Creatinine: 1.1 mg/dL (ref 0.6–1.2)
GFR African American: >60 (ref >60)
GFR Non-African American: 50 — AB (ref >60)
Globulin: 3.9 g/dL
Glucose: 162 mg/dL — ABNORMAL HIGH (ref 70–99)
Potassium: 5 mmol/L (ref 3.5–5.1)
Sodium: 135 mmol/L — ABNORMAL LOW (ref 136–145)
Total Bilirubin: 0.4 mg/dL (ref 0.0–1.0)
Total Protein: 7.6 g/dL (ref 6.4–8.2)

## 2019-10-16 NOTE — ED Notes (Signed)
Pt has wound with some redness around wound after burning herself with a heat pad approx a wk ago on the posterior, upper part of left leg.  Pt denies fever.  Pt has tried abx from PCP but not getting better.     Jerrel Ivory, RN  10/16/19 [phone]

## 2019-10-16 NOTE — Telephone Encounter (Signed)
Pt lmvm  Pt c/b [phone]  Pt states:   - was in on Friday   - was to call today with an update    - it has gotten worse   - redness is wider   - its draining   - bleeding real bad   - more swollen   - more painful   - needs to known what to do  Please call to discuss thanks

## 2019-10-16 NOTE — Telephone Encounter (Signed)
If it's doing worse, she may need IV antibiotics.  She should go to the ER.  They do a good job keeping patients separated and safe.  If the infection gets worse, it can spread through her blood and threaten her life.

## 2019-10-16 NOTE — H&P (Signed)
HOSPITALISTS HISTORY AND PHYSICAL    10/16/2019 9:13 PM    Patient Information:  Rachael Mays is a 63 y.o. female [id number]  PCP:  Almira Coaster, APRN - CNP (Tel: [phone] )    Chief complaint:    Chief Complaint   Patient presents with   ??? Burn     pt burned herself on the lower back with heating pad, pt states no feeling in back. noticed burned last monday. possible cellulitis         History of Present Illness:  Rachael Mays is a 63 y.o. female with hx of HTN, OSA, HLD, PE, CHF and obesity.  Presents to ER for left posterior leg wound.  She states she was having Sciatica pain about 2 weeks ago and placed a rice/hot pack on the back of her leg for about 32min.  She states she was already having pain on back of her leg from back so initially did not think to look.  On Monday of last week she noticed wound.  She was started on Bactrim and then switched to clindamycin secondary to cultures showing MRSA and enterococcus faecalis.  She was evaluated by Dermatology on Friday and started silvadene cream as well.  She reports over the weekend symptoms got worse.  She report decreased appetite.  Chills but no fever.  Pain became increasingly worse. She reports a large amount of yellow/clear/bloody drainage.   She denies any prior hx of skin infections.       History obtained from patient       REVIEW OF SYSTEMS:   Review of Systems   Constitutional: Positive for appetite change and chills. Negative for fever.   HENT: Negative.    Eyes: Negative.    Respiratory: Negative.    Cardiovascular: Negative.    Gastrointestinal: Positive for nausea. Negative for diarrhea and vomiting.   Endocrine: Negative.    Genitourinary: Negative.    Musculoskeletal: Positive for back pain.   Skin: Positive for color change and wound.   Neurological: Negative.    Hematological: Negative.    Psychiatric/Behavioral: Negative.         Past Medical History:   has a past medical history of Anxiety, Asthma, Blood circulation, collateral, CAD (coronary artery disease), CHF (congestive heart failure) ([place]), Chronic back pain, Deep vein thrombosis, Degeneration of thoracic intervertebral disc, Depression, GERD (gastroesophageal reflux disease), GERD (gastroesophageal reflux disease), Hx of blood clots, Hyperlipidemia, Hypertension, Idiopathic peripheral neuropathy, Melanoma ([place]), Obesity, Obstructive sleep apnea, Obstructive sleep apnea (adult) (pediatric), and Pulmonary embolism ([place]).     Past Surgical History:   has a past surgical history that includes Colonoscopy; Cesarean section; Cholecystectomy; shoulder surgery; Lap Band (05/01/08); back surgery; Colonoscopy (04/08/10); other surgical history; Upper gastrointestinal endoscopy (05/19/13); other surgical history (07/05/13); Upper gastrointestinal endoscopy (07/21/13); Upper gastrointestinal endoscopy (N/A, 07/31/2014); vascular surgery (04/2015); Diagnostic Cardiac Cath Lab Procedure; Coronary angioplasty; Endoscopy, colon, diagnostic; Breast surgery; Dilatation, esophagus; Skin cancer excision (12/29/2017); and Hysterectomy, vaginal.     Medications:  No current facility-administered medications on file prior to encounter.      Current Outpatient Medications on File Prior to Encounter   Medication Sig Dispense Refill   ??? silver sulfADIAZINE (SILVADENE) 1 % cream Apply topically to the wound on the left thigh daily. 25 g 1   ??? clindamycin (CLEOCIN) 300 MG capsule Take 1 capsule by mouth 4 times daily for 10 days 40 capsule 0   ??? ranolazine (RANEXA) 1000 MG extended release tablet Take  1 tablet by mouth 2 times daily 60 tablet 5   ??? Cholecalciferol (VITAMIN D3) 50 MCG (2000 UT) CAPS Take 1 capsule by mouth daily     ??? gabapentin (NEURONTIN) 300 MG capsule TAKE TWO CAPSULES BY MOUTH ONCE NIGHTLY 60 capsule 5   ??? spironolactone (ALDACTONE) 25 MG tablet TAKE ONE AND ONE-HALF TABLET BY MOUTH DAILY  38 tablet 0   ??? calcium-vitamin D (CALCIUM 500/D) 500-200 MG-UNIT per tablet Take 1 tablet by mouth daily     ??? ketoconazole (NIZORAL) 2 % cream Apply to the lesions on the upper back twice daily until improved. 30 g 1   ??? rosuvastatin (CRESTOR) 10 MG tablet TAKE ONE TABLET BY MOUTH DAILY 30 tablet 0   ??? omeprazole (PRILOSEC) 40 MG delayed release capsule TAKE ONE CAPSULE BY MOUTH DAILY 90 capsule 1   ??? lisinopril (PRINIVIL;ZESTRIL) 40 MG tablet TAKE ONE TABLET BY MOUTH DAILY 90 tablet 1   ??? montelukast (SINGULAIR) 10 MG tablet TAKE ONE TABLET BY MOUTH DAILY 30 tablet 2   ??? acetic acid-hydrocortisone (VOSOL-HC) 1-2 % otic solution 4 drops in affected ear or ears every 3 hours while awake 15 mL 1   ??? albuterol sulfate HFA (PROVENTIL HFA) 108 (90 Base) MCG/ACT inhaler Inhale 2 puffs into the lungs every 6 hours as needed for Wheezing 3 Inhaler 3   ??? metoprolol (LOPRESSOR) 100 MG tablet Take 1 twice a day 270 tablet 1   ??? lidocaine (LIDODERM) 5 % Place 1 patch onto the skin daily 12 hours on, 12 hours off. 30 patch 0   ??? nitroGLYCERIN (NITROSTAT) 0.4 MG SL tablet up to max of 3 total doses. If no relief after 1 dose, call 911. 25 tablet 3   ??? prasugrel (EFFIENT) 10 MG TABS Take 1 tablet by mouth daily 90 tablet 3   ??? torsemide (DEMADEX) 20 MG tablet Take 1 tablet by mouth daily as needed (swelling) 30 tablet 1   ??? diclofenac sodium 1 % GEL Apply 2 g topically 2 times daily 1 Tube 3   ??? aspirin 81 MG EC tablet Take 1 tablet by mouth daily 30 tablet 11   ??? triamcinolone (KENALOG) 0.1 % cream Apply topically 2 times daily.Apply with Q tip in each ear BID for 1 week. 15 g 1   ??? mupirocin (BACTROBAN) 2 % ointment Apply topically  Daily.Apply with Q tip in each naris at night 15 g 0   ??? Multiple Vitamins-Minerals (MULTIVITAMIN PO) Take 1 tablet by mouth daily      ??? cetirizine (ZYRTEC ALLERGY) 10 MG tablet Take 10 mg by mouth daily.         Allergies:  Allergies   Allergen Reactions   ??? Adhesive Tape      Local itching   ???  Seasonal    ??? Talwin [Pentazocine] Other (See Comments)     dizzy        Social History:  [facility] Lives    reports that she quit smoking about 7 years ago. Her smoking use included cigarettes. She has a 20.00 pack-year smoking history. She quit smokeless tobacco use about 6 years ago. She reports that she does not drink alcohol or use drugs.     Family History:  family history includes Arthritis in her mother; Cancer in her mother; Cancer (age of onset: 41) in her father; Depression in her mother; Diabetes in an other family member; Early Death in her paternal grandmother; Heart Disease in her maternal  grandmother and paternal grandfather; Heart Disease (age of onset: 47) in her mother; High Blood Pressure in her mother and another family member; Obesity in an other family member; Other in her brother, brother, daughter, and sister; Ovarian Cancer (age of onset: 52) in her mother. ,     Physical Exam:  BP 123/62    Pulse 58    Temp 97.8 ??F (36.6 ??C) (Oral)    Resp 18    Ht 5\' 5"  (1.651 m)    Wt 300 lb (136.1 kg)    SpO2 95%    BMI 49.92 kg/m??   Physical Exam  Vitals signs and nursing note reviewed.   Constitutional:       Appearance: Normal appearance.   HENT:      Head: Normocephalic and atraumatic.      Mouth/Throat:      Mouth: Mucous membranes are moist.   Eyes:      Pupils: Pupils are equal, round, and reactive to light.   Neck:      Musculoskeletal: Normal range of motion.   Cardiovascular:      Rate and Rhythm: Normal rate and regular rhythm.      Pulses: Normal pulses.      Heart sounds: No murmur.   Pulmonary:      Effort: Pulmonary effort is normal. No respiratory distress.      Breath sounds: Normal breath sounds.   Abdominal:      General: Abdomen is flat. Bowel sounds are normal. There is no distension.      Palpations: Abdomen is soft.      Tenderness: There is no abdominal tenderness.   Musculoskeletal: Normal range of motion.      Right lower leg: No edema.      Left lower leg: No edema.   Skin:      General: Skin is warm.      Findings: Wound present.             Comments: Left posterior thigh open wound 1 1/2 inches in diameter with surrounding erythema and induration.  Bloody serous drainage.    Neurological:      General: No focal deficit present.      Mental Status: She is alert and oriented to person, place, and time.   Psychiatric:         Mood and Affect: Mood normal.         Behavior: Behavior normal.         Thought Content: Thought content normal.         Judgment: Judgment normal.         Labs:  CBC:   Lab Results   Component Value Date    WBC 6.4 10/16/2019    RBC 4.35 10/16/2019    HGB 12.9 10/16/2019    HCT 39.4 10/16/2019    MCV 90.7 10/16/2019    MCH 29.6 10/16/2019    MCHC 32.7 10/16/2019    RDW 13.9 10/16/2019    PLT 247 10/16/2019    MPV 8.4 10/16/2019     BMP:    Lab Results   Component Value Date    NA 135 10/16/2019    K 5.0 10/16/2019    K 3.9 05/05/2017    CL 103 10/16/2019    CO2 22 10/16/2019    BUN 24 10/16/2019    CREATININE 1.1 10/16/2019    CALCIUM 10.2 10/16/2019    GFRAA >60 10/16/2019    GFRAA >60 03/16/2012  LABGLOM 50 10/16/2019    GLUCOSE 162 10/16/2019     No orders to display     Chest Xray:   EKG:    I visualized CXR images and EKG strips    Problem List  Active Problems:    * No active hospital problems. *  Resolved Problems:    * No resolved hospital problems. *        Assessment/Plan:   1. Left Posterior Leg Wound with Cellulitis, culture MRSA, Failed Outpatient Tx   Pt will be admitted to medical floor, IV antibiotics Vancomycin pharmacy to  dose, Cefepime,     Pain control.     Blood cx obtained.  Pt afebrile but having chills,    Surgery consulted, she has large indurated area around open wound.    Follow labs closely while on IV antibiotics.     2. Hx 2nd Degree Burn    3. Hypertension   Continue home meds, BP stable.     4. Hyperlipidemia   Continue statin    5. Obesity          DVT prophylaxis Lovenox  Code status Full   Diet Regular, NPO midnight  IV access  peripheral     Admit as inpatient I anticipate hospitalization spanning more than two midnights for investigation and treatment of the above medically necessary diagnoses.    Please note that some part of this chart was generated using [employer]. Although every effort was made to ensure the accuracy of this automated transcription, some errors in transcription may have occurred inadvertently. If you may need any clarification, please do not hesitate to contact me through [facility].       Jewel Baize, APRN - CNP    10/16/2019 9:13 PM

## 2019-10-16 NOTE — ED Provider Notes (Signed)
[facility] Emergency Department      Pt Name: Rachael Mays  MRN: [id number]  [place] Feb 03, 1956  Date of evaluation: 10/16/2019  Provider: Uvaldo Rising, MD  I independently performed a history and physical on Rachael Mays.   All diagnostic, treatment, and disposition decisions were made by myself in conjunction with the advanced practice provider.     HPI: Rachael Mays presented with   Chief Complaint   Patient presents with   ??? Burn     pt burned herself on the lower back with heating pad, pt states no feeling in back. noticed burned last monday. possible cellulitis      Rachael Mays has a past medical history of Anxiety, Asthma, Blood circulation, collateral, CAD (coronary artery disease), CHF (congestive heart failure) ([place]), Chronic back pain, Deep vein thrombosis, Degeneration of thoracic intervertebral disc (10/15/2017), Depression, GERD (gastroesophageal reflux disease), GERD (gastroesophageal reflux disease) (01/23/2009), blood clots, Hyperlipidemia, Hypertension, Idiopathic peripheral neuropathy (03/26/2017), Melanoma ([place]), Obesity, Obstructive sleep apnea (09/18/2015), Obstructive sleep apnea (adult) (pediatric) (09/18/2015), and Pulmonary embolism ([place]).  She has a past surgical history that includes Colonoscopy; Cesarean section; Cholecystectomy; shoulder surgery; Lap Band (05/01/08); back surgery; Colonoscopy (04/08/10); other surgical history; Upper gastrointestinal endoscopy (05/19/13); other surgical history (07/05/13); Upper gastrointestinal endoscopy (07/21/13); Upper gastrointestinal endoscopy (N/A, 07/31/2014); vascular surgery (04/2015); Diagnostic Cardiac Cath Lab Procedure; Coronary angioplasty; Endoscopy, colon, diagnostic; Breast surgery; Dilatation, esophagus; Skin cancer excision (12/29/2017); and Hysterectomy, vaginal.    No current facility-administered medications on file prior to encounter.      Current Outpatient Medications on File Prior to Encounter   Medication Sig Dispense  Refill   ??? silver sulfADIAZINE (SILVADENE) 1 % cream Apply topically to the wound on the left thigh daily. 25 g 1   ??? clindamycin (CLEOCIN) 300 MG capsule Take 1 capsule by mouth 4 times daily for 10 days 40 capsule 0   ??? ranolazine (RANEXA) 1000 MG extended release tablet Take 1 tablet by mouth 2 times daily 60 tablet 5   ??? Cholecalciferol (VITAMIN D3) 50 MCG (2000 UT) CAPS Take 1 capsule by mouth daily     ??? gabapentin (NEURONTIN) 300 MG capsule TAKE TWO CAPSULES BY MOUTH ONCE NIGHTLY 60 capsule 5   ??? spironolactone (ALDACTONE) 25 MG tablet TAKE ONE AND ONE-HALF TABLET BY MOUTH DAILY 38 tablet 0   ??? calcium-vitamin D (CALCIUM 500/D) 500-200 MG-UNIT per tablet Take 1 tablet by mouth daily     ??? ketoconazole (NIZORAL) 2 % cream Apply to the lesions on the upper back twice daily until improved. 30 g 1   ??? rosuvastatin (CRESTOR) 10 MG tablet TAKE ONE TABLET BY MOUTH DAILY 30 tablet 0   ??? omeprazole (PRILOSEC) 40 MG delayed release capsule TAKE ONE CAPSULE BY MOUTH DAILY 90 capsule 1   ??? lisinopril (PRINIVIL;ZESTRIL) 40 MG tablet TAKE ONE TABLET BY MOUTH DAILY 90 tablet 1   ??? montelukast (SINGULAIR) 10 MG tablet TAKE ONE TABLET BY MOUTH DAILY 30 tablet 2   ??? acetic acid-hydrocortisone (VOSOL-HC) 1-2 % otic solution 4 drops in affected ear or ears every 3 hours while awake 15 mL 1   ??? albuterol sulfate HFA (PROVENTIL HFA) 108 (90 Base) MCG/ACT inhaler Inhale 2 puffs into the lungs every 6 hours as needed for Wheezing 3 Inhaler 3   ??? metoprolol (LOPRESSOR) 100 MG tablet Take 1 twice a day 270 tablet 1   ??? lidocaine (LIDODERM) 5 % Place 1 patch onto the skin daily  12 hours on, 12 hours off. 30 patch 0   ??? nitroGLYCERIN (NITROSTAT) 0.4 MG SL tablet up to max of 3 total doses. If no relief after 1 dose, call 911. 25 tablet 3   ??? prasugrel (EFFIENT) 10 MG TABS Take 1 tablet by mouth daily 90 tablet 3   ??? torsemide (DEMADEX) 20 MG tablet Take 1 tablet by mouth daily as needed (swelling) 30 tablet 1   ??? diclofenac sodium 1 %  GEL Apply 2 g topically 2 times daily 1 Tube 3   ??? aspirin 81 MG EC tablet Take 1 tablet by mouth daily 30 tablet 11   ??? triamcinolone (KENALOG) 0.1 % cream Apply topically 2 times daily.Apply with Q tip in each ear BID for 1 week. 15 g 1   ??? mupirocin (BACTROBAN) 2 % ointment Apply topically  Daily.Apply with Q tip in each naris at night 15 g 0   ??? Multiple Vitamins-Minerals (MULTIVITAMIN PO) Take 1 tablet by mouth daily      ??? cetirizine (ZYRTEC ALLERGY) 10 MG tablet Take 10 mg by mouth daily.       PHYSICAL EXAM  Vitals: BP 124/64    Pulse 58    Temp 97.8 ??F (36.6 ??C) (Oral)    Resp 18    Ht 5\' 5"  (1.651 m)    Wt 300 lb (136.1 kg)    SpO2 95%    BMI 49.92 kg/m??   Constitutional:  63 y.o. female alert  HENT:  Atraumatic, oral mucosa moist  Neck:  No visible JVD, supple  Chest/Lungs:  Respiratory effort normal  Abdomen:  obese  Back:  No gross deformity  Extremities:  Normal tone and perfusion, wound to posterior thigh with erythema, see below             Medical Decision Making and Plan: Briefly, this is an 63 y.o.female who presented with wound to posterior thigh, initially from a burn.  She was on Bactrim and subsequently on clindamycin with silvadene prescribed by her dermatologist.  She continues to have worsening symptoms.  She will require admission for IV antibiotics and further care.    For further details of [facility] Emergency Department encounter, please see documentation by advanced practice provider Harriette Ohara, PA-C.    Labs Reviewed   COMPREHENSIVE METABOLIC PANEL - Abnormal; Notable for the following components:       Result Value    Sodium 135 (*)     Glucose 162 (*)     BUN 24 (*)     GFR Non-African American 50 (*)     Albumin/Globulin Ratio 0.9 (*)     All other components within normal limits    Narrative:     Performed at:  [facility]  [address],  [place] [address]   Phone [phone]   SEDIMENTATION RATE - Abnormal; Notable for the  following components:    Sed Rate 77 (*)     All other components within normal limits    Narrative:     Performed at:  [facility]  [address]  [place] [address]   Phone [phone]   CULTURE, BLOOD 1   CULTURE, BLOOD 2   CBC WITH AUTO DIFFERENTIAL    Narrative:     Performed at:  [facility]  [address]  [place] [address]   Phone [phone]   LACTATE,  SEPSIS    Narrative:     Performed at:  [facility]  [address],  [place] [address]   Phone [phone]     Medications administered:  Medications   sodium chloride flush 0.9 % injection 10 mL (has no administration in time range)   sodium chloride flush 0.9 % injection 10 mL (has no administration in time range)   vancomycin (VANCOCIN) 2,000 mg in dextrose 5 % 500 mL IVPB (2,000 mg Intravenous New Bag 10/16/19 2031)   fentaNYL (SUBLIMAZE) injection 100 mcg (100 mcg Intravenous Given 10/16/19 1952)   ondansetron (ZOFRAN) injection 4 mg (4 mg Intravenous Given 10/16/19 1952)   cefepime (MAXIPIME) 2 g IVPB minibag (0 g Intravenous Stopped 10/16/19 2028)   Tetanus-Diphth-Acell Pertussis (BOOSTRIX) injection 0.5 mL (0.5 mLs Intramuscular Given 10/16/19 1952)     FINAL IMPRESSION:    1. Cellulitis of left leg    2. Failure of outpatient treatment    3. History of burn, second degree       DISPOSITION Admitted 10/16/2019 09:18:27 PM       Dionne Milo, MD  10/16/19 2228

## 2019-10-16 NOTE — ED Provider Notes (Signed)
[facility]        Pt Name: Rachael Mays  MRN: [id number]  [place] May 18, 1956  Date of evaluation: 10/16/2019  Provider: Harriette Ohara, PA-C  PCP: Almira Coaster, APRN - CNP     I have seen and evaluated this patient with my supervising physician Dionne Milo, *.    CHIEF COMPLAINT       Chief Complaint   Patient presents with   ??? Burn     pt burned herself on the lower back with heating pad, pt states no feeling in back. noticed burned last monday. possible cellulitis        HISTORY OF PRESENT ILLNESS   (Location, Timing/Onset, Context/Setting, Quality, Duration, Modifying Factors, Severity, Associated Signs and Symptoms)  Note limiting factors.     Rachael Mays is a 63 y.o. female resents the emergency department with difficulties as a pertains to a burn on the posterior aspect of her left leg.  Patient states that she had a microwavable hot pack that she placed on the posterior aspect of her leg little bit more than a week ago.  She states in doing so she sustained a burn over the posterior aspect of her left thigh but was unaware of it.  Patient states that she really is not having much in the way of painful sensation in and around the area until as of late.  She reports that she was seen and evaluated by her primary care physician who initially placed her on an antibiotic.  It was reported there was a concern for MRSA and it was then switched by her dermatologist to ciprofloxacin.  Unfortunately she is failed 2 different outpatient antibiotics with worsening symptoms of induration and swelling.  She states that she is able to ambulate but is having pain.  She has felt chilled but is unsure if she is had a documented fever.  Patient states that she is not anticoagulated.  She denies that she is experiencing chest pain palpitations lightheadedness or shortness of breath.  She has no GI or GU complaints.  She is not  currently up-to-date with her tetanus vaccination.  Over the past 2 days she has had increasing pain and today at the present time she reports pain and discomfort which is 7 out of 10 because of the above-mentioned she is referred to the emergency department for ongoing care management on an inpatient basis for her failure of outpatient treatment.    Nursing Notes were all reviewed and agreed with or any disagreements were addressed in the HPI.    REVIEW OF SYSTEMS    (2-9 systems for level 4, 10 or more for level 5)     Review of Systems   Constitutional: Negative for activity change, chills and fever.   Respiratory: Negative for chest tightness and shortness of breath.    Cardiovascular: Negative for chest pain.   Gastrointestinal: Negative for abdominal pain, diarrhea, nausea and vomiting.   Genitourinary: Negative for dysuria and flank pain.   Musculoskeletal: Positive for back pain (chronic).   Skin: Positive for color change and wound.   Neurological: Negative for seizures and headaches.       Positives and Pertinent negatives as per HPI.  Except as noted above in the ROS, all other systems were reviewed and negative.       PAST MEDICAL HISTORY     Past Medical History:   Diagnosis Date   ??? Anxiety    ???  Asthma    ??? Blood circulation, collateral    ??? CAD (coronary artery disease)    ??? CHF (congestive heart failure) ([place])    ??? Chronic back pain    ??? Deep vein thrombosis    ??? 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 Depression    ??? GERD (gastroesophageal reflux disease)     NO LONGER SINCE LAP   ??? GERD (gastroesophageal reflux disease) 01/23/2009   ??? Hx of blood clots    ??? Hyperlipidemia     hx; resolved with lap band   ??? Hypertension    ??? Idiopathic peripheral neuropathy 03/26/2017   ??? Melanoma ([place])     upper back   ??? Obesity     hx of; had lap band   ??? Obstructive sleep apnea 09/18/2015     Updating Deprecated Diagnoses   ??? Obstructive sleep apnea (adult) (pediatric) 09/18/2015   ??? Pulmonary  embolism ([place])          SURGICAL HISTORY     Past Surgical History:   Procedure Laterality Date   ??? BACK SURGERY      neck  plates   ??? BREAST SURGERY      left lumpectomy   ??? CESAREAN SECTION     ??? CHOLECYSTECTOMY     ??? COLONOSCOPY     ??? COLONOSCOPY  04/08/10   ??? CORONARY ANGIOPLASTY     ??? DIAGNOSTIC CARDIAC CATH LAB PROCEDURE     ??? DILATATION, ESOPHAGUS     ??? ENDOSCOPY, COLON, DIAGNOSTIC     ??? HYSTERECTOMY, VAGINAL     ??? LAP BAND  05/01/08    Dr. Nicole Cella   ??? OTHER SURGICAL HISTORY      [place] Filter: curently in place   ??? OTHER SURGICAL HISTORY  07/05/13    lap band removal   ??? SHOULDER SURGERY     ??? SKIN CANCER EXCISION  12/29/2017   ??? UPPER GASTROINTESTINAL ENDOSCOPY  05/19/13   ??? UPPER GASTROINTESTINAL ENDOSCOPY  07/21/13    ESOPHAGEAL STENT PLACEMENT   ??? UPPER GASTROINTESTINAL ENDOSCOPY N/A 07/31/2014    Esophagogastroduodenoscopy with esophageal balloon dilation   ??? VASCULAR SURGERY  04/2015    Lucendia Herrlich, celiac artery angiogram, normal abd arteries         CURRENTMEDICATIONS       Previous Medications    ACETIC ACID-HYDROCORTISONE (VOSOL-HC) 1-2 % OTIC SOLUTION    4 drops in affected ear or ears every 3 hours while awake    ALBUTEROL SULFATE HFA (PROVENTIL HFA) 108 (90 BASE) MCG/ACT INHALER    Inhale 2 puffs into the lungs every 6 hours as needed for Wheezing    ASPIRIN 81 MG EC TABLET    Take 1 tablet by mouth daily    CALCIUM-VITAMIN D (CALCIUM 500/D) 500-200 MG-UNIT PER TABLET    Take 1 tablet by mouth daily    CETIRIZINE (ZYRTEC ALLERGY) 10 MG TABLET    Take 10 mg by mouth daily.    CHOLECALCIFEROL (VITAMIN D3) 50 MCG (2000 UT) CAPS    Take 1 capsule by mouth daily    CLINDAMYCIN (CLEOCIN) 300 MG CAPSULE    Take 1 capsule by mouth 4 times daily for 10 days    DICLOFENAC SODIUM 1 % GEL    Apply 2 g topically 2 times daily    GABAPENTIN (NEURONTIN) 300 MG CAPSULE    TAKE TWO CAPSULES BY MOUTH ONCE NIGHTLY    KETOCONAZOLE (NIZORAL) 2 % CREAM  Apply to the lesions on the upper back twice daily until improved.     LIDOCAINE (LIDODERM) 5 %    Place 1 patch onto the skin daily 12 hours on, 12 hours off.    LISINOPRIL (PRINIVIL;ZESTRIL) 40 MG TABLET    TAKE ONE TABLET BY MOUTH DAILY    METOPROLOL (LOPRESSOR) 100 MG TABLET    Take 1 twice a day    MONTELUKAST (SINGULAIR) 10 MG TABLET    TAKE ONE TABLET BY MOUTH DAILY    MULTIPLE VITAMINS-MINERALS (MULTIVITAMIN PO)    Take 1 tablet by mouth daily     MUPIROCIN (BACTROBAN) 2 % OINTMENT    Apply topically  Daily.Apply with Q tip in each naris at night    NITROGLYCERIN (NITROSTAT) 0.4 MG SL TABLET    up to max of 3 total doses. If no relief after 1 dose, call 911.    OMEPRAZOLE (PRILOSEC) 40 MG DELAYED RELEASE CAPSULE    TAKE ONE CAPSULE BY MOUTH DAILY    PRASUGREL (EFFIENT) 10 MG TABS    Take 1 tablet by mouth daily    RANOLAZINE (RANEXA) 1000 MG EXTENDED RELEASE TABLET    Take 1 tablet by mouth 2 times daily    ROSUVASTATIN (CRESTOR) 10 MG TABLET    TAKE ONE TABLET BY MOUTH DAILY    SILVER SULFADIAZINE (SILVADENE) 1 % CREAM    Apply topically to the wound on the left thigh daily.    SPIRONOLACTONE (ALDACTONE) 25 MG TABLET    TAKE ONE AND ONE-HALF TABLET BY MOUTH DAILY    TORSEMIDE (DEMADEX) 20 MG TABLET    Take 1 tablet by mouth daily as needed (swelling)    TRIAMCINOLONE (KENALOG) 0.1 % CREAM    Apply topically 2 times daily.Apply with Q tip in each ear BID for 1 week.         ALLERGIES     Adhesive tape; Seasonal; and [place] [pentazocine]    FAMILYHISTORY       Family History   Problem Relation Age of Onset   ??? Arthritis Mother    ??? Cancer Mother    ??? Depression Mother    ??? High Blood Pressure Mother    ??? Heart Disease Mother 81        MI    ??? Ovarian Cancer Mother 69   ??? Cancer Father 44        colon   ??? Heart Disease Maternal Grandmother    ??? Early Death Paternal Grandmother    ??? Heart Disease Paternal Grandfather    ??? Diabetes Other    ??? High Blood Pressure Other    ??? Obesity Other    ??? Other Sister         OSA   ??? Other Brother         OSA   ??? Other Brother         OSA   ???  Other Daughter         OSA          SOCIAL HISTORY       Social History     Tobacco Use   ??? Smoking status: Former Smoker     Packs/day: 0.50     Years: 40.00     Pack years: 20.00     Types: Cigarettes     Last attempt to quit: 08/07/2012     Years since quitting: 7.1   ??? Smokeless tobacco: Former Systems developer  Quit date: 06/16/2013   ??? Tobacco comment: started to smoke at age 34 / only smoked 0.5 p.p.d    Substance Use Topics   ??? Alcohol use: No     Alcohol/week: 0.0 standard drinks   ??? Drug use: No       SCREENINGS             PHYSICAL EXAM    (up to 7 for level 4, 8 or more for level 5)     ED Triage Vitals [10/16/19 1639]   BP Temp Temp Source Pulse Resp SpO2 Height Weight   120/64 97.8 ??F (36.6 ??C) Oral 63 16 97 % 5\' 5"  (1.651 m) 300 lb (136.1 kg)       Physical Exam  Vitals signs and nursing note reviewed.   Constitutional:       General: She is not in acute distress.     Appearance: She is well-developed. She is not diaphoretic.   HENT:      Head: Normocephalic and atraumatic.      Right Ear: External ear normal.      Left Ear: External ear normal.   Eyes:      General: No scleral icterus.        Right eye: No discharge.         Left eye: No discharge.      Conjunctiva/sclera: Conjunctivae normal.   Neck:      Musculoskeletal: Normal range of motion.      Vascular: No JVD.   Cardiovascular:      Rate and Rhythm: Normal rate and regular rhythm.      Heart sounds: No murmur. No friction rub. No gallop.    Pulmonary:      Effort: Pulmonary effort is normal. No accessory muscle usage or respiratory distress.      Breath sounds: Normal breath sounds. No wheezing, rhonchi or rales.   Musculoskeletal:        Legs:    Skin:     General: Skin is warm and dry.   Neurological:      Mental Status: She is alert and oriented to person, place, and time.      GCS: GCS eye subscore is 4. GCS verbal subscore is 5. GCS motor subscore is 6.      Cranial Nerves: No cranial nerve deficit.      Sensory: No sensory deficit.       Coordination: Coordination normal.   Psychiatric:         Behavior: Behavior normal.       Clinical image:          DIAGNOSTIC RESULTS   LABS:    Labs Reviewed   COMPREHENSIVE METABOLIC PANEL - Abnormal; Notable for the following components:       Result Value    Sodium 135 (*)     Glucose 162 (*)     BUN 24 (*)     GFR Non-African American 50 (*)     Albumin/Globulin Ratio 0.9 (*)     All other components within normal limits    Narrative:     Performed at:  [facility]  [address],  [place] [address]   Phone [phone]   SEDIMENTATION RATE - Abnormal; Notable for the following components:    Sed Rate 77 (*)     All other components within normal limits    Narrative:     Performed at:  [facility]  [address]  [place] [address]   Phone [phone]   CULTURE, BLOOD 1   CULTURE, BLOOD 2   CBC WITH AUTO DIFFERENTIAL    Narrative:     Performed at:  [facility]  [address],  [place] [address]   Phone [phone]   LACTATE, SEPSIS    Narrative:     Performed at:  [facility]  [address],  [place] [address]   Phone [phone]       All other labs were within normal range or not returned as of this dictation.    EKG: All EKG's are interpreted by the Emergency Department Physician in the absence of a cardiologist.  Please see their note for interpretation of EKG.      RADIOLOGY:   Non-plain film images such as CT, Ultrasound and MRI are read by the radiologist. Plain radiographic images are visualized and preliminarily interpreted by the ED Provider with the below findings:        Interpretation per the Radiologist below, if available at the time of this note:    No orders to display     No results found.        PROCEDURES   Unless otherwise noted below, none     Procedures    CRITICAL CARE TIME   N/A    CONSULTS:  IP CONSULT TO HOSPITALIST      EMERGENCY  DEPARTMENT COURSE and DIFFERENTIAL DIAGNOSIS/MDM:   Vitals:    Vitals:    10/16/19 1639 10/16/19 2032   BP: 120/64 123/62   Pulse: 63 58   Resp: 16 18   Temp: 97.8 ??F (36.6 ??C)    TempSrc: Oral    SpO2: 97% 95%   Weight: 300 lb (136.1 kg)    Height: 5\' 5"  (1.651 m)        Patient was given the following medications:  Medications   sodium chloride flush 0.9 % injection 10 mL (has no administration in time range)   sodium chloride flush 0.9 % injection 10 mL (has no administration in time range)   vancomycin (VANCOCIN) 2,000 mg in dextrose 5 % 500 mL IVPB (2,000 mg Intravenous New Bag 10/16/19 2031)   fentaNYL (SUBLIMAZE) injection 100 mcg (100 mcg Intravenous Given 10/16/19 1952)   ondansetron (ZOFRAN) injection 4 mg (4 mg Intravenous Given 10/16/19 1952)   cefepime (MAXIPIME) 2 g IVPB minibag (0 g Intravenous Stopped 10/16/19 2028)   Tetanus-Diphth-Acell Pertussis (BOOSTRIX) injection 0.5 mL (0.5 mLs Intramuscular Given 10/16/19 1952)         The patient's detailed history of present illness is documented as above. Upon arrival to the emergency department the patient's vital signs are as documented. The patient is noted to be hemodynamically stable and afebrile. Physical examination findings are as above.  IV access was obtained.  Laboratory testing and work-up was initiated.  Patient is demonstrating evidence of significant cellulitis in and around the area.  Additional labs are ordered to initial nursing protocol.  Lactic blood cultures and vancomycin and cefepime were initiated.  Tetanus was updated.  CBC demonstrates no evidence of leukocytosis anemia and/or thrombocytopenia.  BUN is 24 creatinine is 1.1 nonfasting glucose 162 electrolytes and LFTs are as documented.  In light of the above mention the patient will require ongoing care management on an inpatient basis.  She is at a lactic acid is not elevated at  1.3.  Sed rate is elevated at 77.  Antibiotics as above have already been initiated.  No other concerning  signs and symptoms.  Patient will require updated tetanus vaccination here in the emerge department today.  She will be admitted to the medical surgical services for ongoing care management the diagnoses below.  Case was discussed directly with the hospitalist who accepts the patient for admission for ongoing care management the diagnoses below.    FINAL IMPRESSION      1. Cellulitis of left leg    2. Failure of outpatient treatment    3. History of burn, second degree          DISPOSITION/PLAN   DISPOSITION Admitted 10/16/2019 09:18:27 PM         (Please note that portions of this note were completed with a voice recognition program.  Efforts were made to edit the dictations but occasionally words are mis-transcribed.)    Harriette Ohara, PA-C (electronically signed)           Harriette Ohara, PA-C  10/16/19 2120

## 2019-10-16 NOTE — Telephone Encounter (Signed)
 Called and read your message to her and she agreed to go to the ER.    She is thinking of going to an ER at either of the [facility] because that's where her daughter works.

## 2019-10-16 NOTE — ED Notes (Signed)
Bed: 26  Expected date:   Expected time:   Means of arrival: Walk In  Comments:     York Cerise, RN  10/16/19 1901

## 2019-10-16 NOTE — Telephone Encounter (Addendum)
 Called and spoke to patient to get update on cellulitis of R thigh. She said the  surface area of redness has increased, has more swelling and pain, as well as having bleeding. She denies any fever.    I advised her that Dr Lind Guest last office note said to go to the ER if these symptoms developed.   She said she is scared to go to the ER with the pandemic and she said the wait is usually several hours.     Does not know how to send photos through Mychart.     Wanted me to ask you what to do.   Please advise.

## 2019-10-16 NOTE — ED Notes (Signed)
Report called to 4T, RN verbalized understanding and denied any need for further information, patient to be transported to unit at this time      Knute Neu, RN  10/16/19 2337

## 2019-10-17 ENCOUNTER — Inpatient Hospital Stay
Admission: EM | Admit: 2019-10-17 | Discharge: 2019-10-18 | Disposition: A | Discharge status: Home / Self Care | Payer: PRIVATE HEALTH INSURANCE | Source: Other Acute Inpatient Hospital | Admitting: Family Medicine

## 2019-10-17 DIAGNOSIS — L03116 Cellulitis of left lower limb: Principal | ICD-10-CM

## 2019-10-17 LAB — CBC WITH AUTO DIFFERENTIAL
Basophils %: 0.7 %
Basophils Absolute: 0 10*3/uL (ref 0.0–0.2)
Eosinophils %: 4.7 %
Eosinophils Absolute: 0.3 10*3/uL (ref 0.0–0.6)
Hematocrit: 36.4 % (ref 36.0–48.0)
Hemoglobin: 12 g/dL (ref 12.0–16.0)
Lymphocytes %: 34.7 %
Lymphocytes Absolute: 1.9 10*3/uL (ref 1.0–5.1)
MCH: 29.6 pg (ref 26.0–34.0)
MCHC: 33 g/dL (ref 31.0–36.0)
MCV: 89.8 fL (ref 80.0–100.0)
MPV: 8.5 fL (ref 5.0–10.5)
Monocytes %: 11.1 %
Monocytes Absolute: 0.6 10*3/uL (ref 0.0–1.3)
Neutrophils %: 48.8 %
Neutrophils Absolute: 2.7 10*3/uL (ref 1.7–7.7)
Platelets: 236 10*3/uL (ref 135–450)
RBC: 4.05 M/uL (ref 4.00–5.20)
RDW: 13.9 % (ref 12.4–15.4)
WBC: 5.5 10*3/uL (ref 4.0–11.0)

## 2019-10-17 LAB — BASIC METABOLIC PANEL W/ REFLEX TO MG FOR LOW K
Anion Gap: 10 (ref 3–16)
BUN: 21 mg/dL — ABNORMAL HIGH (ref 7–20)
CO2: 24 mmol/L (ref 21–32)
Calcium: 9.4 mg/dL (ref 8.3–10.6)
Chloride: 106 mmol/L (ref 99–110)
Creatinine: 1 mg/dL (ref 0.6–1.2)
GFR African American: >60 (ref >60)
GFR Non-African American: 56 — AB (ref >60)
Glucose: 118 mg/dL — ABNORMAL HIGH (ref 70–99)
Potassium reflex Magnesium: 4.6 mmol/L (ref 3.5–5.1)
Sodium: 140 mmol/L (ref 136–145)

## 2019-10-17 LAB — LACTATE, SEPSIS: Lactic Acid, Sepsis: 1.3 mmol/L (ref 0.4–1.9)

## 2019-10-17 LAB — SEDIMENTATION RATE: Sed Rate: 77 mm/Hr — ABNORMAL HIGH (ref 0–30)

## 2019-10-17 MED ORDER — PRASUGREL HCL 10 MG PO TABS
10 MG | Freq: Every day | ORAL | Status: DC
Start: 2019-10-17 — End: 2019-10-18
  Administered 2019-10-18: 15:00:00 10 mg via ORAL

## 2019-10-17 MED ORDER — POLYETHYLENE GLYCOL 3350 17 G PO PACK
17 g | Freq: Every day | ORAL | Status: DC | PRN
Start: 2019-10-17 — End: 2019-10-18

## 2019-10-17 MED ORDER — VANCOMYCIN HCL 1 G IV SOLR
1 | Freq: Once | INTRAVENOUS | Status: AC
Start: 2019-10-17 — End: 2019-10-16
  Administered 2019-10-17: 02:00:00 2000 mg/kg via INTRAVENOUS

## 2019-10-17 MED ORDER — TETANUS-DIPHTH-ACELL PERTUSSIS 5-2.5-18.5 LF-MCG/0.5 IM SUSP
Freq: Once | INTRAMUSCULAR | Status: AC
Start: 2019-10-17 — End: 2019-10-16
  Administered 2019-10-17: 01:00:00 0.5 mL via INTRAMUSCULAR

## 2019-10-17 MED ORDER — SPIRONOLACTONE 25 MG PO TABS
25 MG | Freq: Every day | ORAL | Status: DC
Start: 2019-10-17 — End: 2019-10-18
  Administered 2019-10-17 – 2019-10-18 (×2): 25 mg via ORAL

## 2019-10-17 MED ORDER — CEFEPIME HCL 2 G IJ SOLR
2 g | Freq: Two times a day (BID) | INTRAMUSCULAR | Status: DC
Start: 2019-10-17 — End: 2019-10-18
  Administered 2019-10-17 – 2019-10-18 (×3): 2 g via INTRAVENOUS

## 2019-10-17 MED ORDER — NORMAL SALINE FLUSH 0.9 % IV SOLN
0.9 % | Freq: Two times a day (BID) | INTRAVENOUS | Status: DC
Start: 2019-10-17 — End: 2019-10-17

## 2019-10-17 MED ORDER — NORMAL SALINE FLUSH 0.9 % IV SOLN
0.9 % | INTRAVENOUS | Status: DC | PRN
Start: 2019-10-17 — End: 2019-10-17

## 2019-10-17 MED ORDER — HYDROCODONE-ACETAMINOPHEN 5-325 MG PO TABS
5-325 MG | Freq: Four times a day (QID) | ORAL | Status: DC | PRN
Start: 2019-10-17 — End: 2019-10-18
  Administered 2019-10-17 – 2019-10-18 (×5): 1 via ORAL

## 2019-10-17 MED ORDER — NORMAL SALINE FLUSH 0.9 % IV SOLN
0.9 % | INTRAVENOUS | Status: DC | PRN
Start: 2019-10-17 — End: 2019-10-18

## 2019-10-17 MED ORDER — DEXTROSE 5 % IV SOLN (MINI-BAG)
5 | Freq: Once | INTRAVENOUS | Status: AC
Start: 2019-10-17 — End: 2019-10-16
  Administered 2019-10-17: 01:00:00 2 g via INTRAVENOUS

## 2019-10-17 MED ORDER — ACETAMINOPHEN 325 MG PO TABS
325 MG | Freq: Four times a day (QID) | ORAL | Status: DC | PRN
Start: 2019-10-17 — End: 2019-10-18

## 2019-10-17 MED ORDER — ENOXAPARIN SODIUM 40 MG/0.4ML SC SOLN
400.4 MG/0.4ML | Freq: Every day | SUBCUTANEOUS | Status: DC
Start: 2019-10-17 — End: 2019-10-18
  Administered 2019-10-17 – 2019-10-18 (×2): 40 mg via SUBCUTANEOUS

## 2019-10-17 MED ORDER — SILVER SULFADIAZINE 1 % EX CREA
1 % | Freq: Every day | CUTANEOUS | Status: DC
Start: 2019-10-17 — End: 2019-10-18

## 2019-10-17 MED ORDER — METOPROLOL TARTRATE 50 MG PO TABS
50 MG | Freq: Two times a day (BID) | ORAL | Status: DC
Start: 2019-10-17 — End: 2019-10-17
  Administered 2019-10-17: 16:00:00 100 mg via ORAL

## 2019-10-17 MED ORDER — LIDOCAINE-EPINEPHRINE 1 %-1:100000 IJ SOLN
1 %-:00000 | Freq: Once | INTRAMUSCULAR | Status: DC
Start: 2019-10-17 — End: 2019-10-18

## 2019-10-17 MED ORDER — VANCOMYCIN HCL 1 G IV SOLR
1 g | Freq: Three times a day (TID) | INTRAVENOUS | Status: DC
Start: 2019-10-17 — End: 2019-10-18
  Administered 2019-10-17 – 2019-10-18 (×3): 1250 mg via INTRAVENOUS

## 2019-10-17 MED ORDER — PANTOPRAZOLE SODIUM 40 MG PO TBEC
40 MG | Freq: Every day | ORAL | Status: DC
Start: 2019-10-17 — End: 2019-10-18
  Administered 2019-10-17 – 2019-10-18 (×2): 40 mg via ORAL

## 2019-10-17 MED ORDER — RANOLAZINE ER 500 MG PO TB12
500 MG | Freq: Two times a day (BID) | ORAL | Status: DC
Start: 2019-10-17 — End: 2019-10-18
  Administered 2019-10-17 – 2019-10-18 (×4): 1000 mg via ORAL

## 2019-10-17 MED ORDER — ONDANSETRON HCL 4 MG/2ML IJ SOLN
4 MG/2ML | Freq: Four times a day (QID) | INTRAMUSCULAR | Status: DC | PRN
Start: 2019-10-17 — End: 2019-10-18

## 2019-10-17 MED ORDER — NORMAL SALINE FLUSH 0.9 % IV SOLN
0.9 % | Freq: Two times a day (BID) | INTRAVENOUS | Status: DC
Start: 2019-10-17 — End: 2019-10-18
  Administered 2019-10-17 – 2019-10-18 (×2): 10 mL via INTRAVENOUS

## 2019-10-17 MED ORDER — GABAPENTIN 300 MG PO CAPS
300 MG | Freq: Every evening | ORAL | Status: DC
Start: 2019-10-17 — End: 2019-10-18
  Administered 2019-10-17 – 2019-10-18 (×2): 600 mg via ORAL

## 2019-10-17 MED ORDER — PROMETHAZINE HCL 25 MG PO TABS
25 MG | Freq: Four times a day (QID) | ORAL | Status: DC | PRN
Start: 2019-10-17 — End: 2019-10-18

## 2019-10-17 MED ORDER — ROSUVASTATIN CALCIUM 10 MG PO TABS
10 MG | ORAL_TABLET | ORAL | 0 refills | Status: DC
Start: 2019-10-17 — End: 2019-11-13

## 2019-10-17 MED ORDER — LISINOPRIL 20 MG PO TABS
20 MG | Freq: Every day | ORAL | Status: DC
Start: 2019-10-17 — End: 2019-10-18
  Administered 2019-10-17: 16:00:00 40 mg via ORAL

## 2019-10-17 MED ORDER — FENTANYL CITRATE (PF) 100 MCG/2ML IJ SOLN
100 MCG/2ML | Freq: Once | INTRAMUSCULAR | Status: AC
Start: 2019-10-17 — End: 2019-10-16
  Administered 2019-10-17: 01:00:00 100 ug via INTRAVENOUS

## 2019-10-17 MED ORDER — ASPIRIN 81 MG PO TBEC
81 MG | Freq: Every day | ORAL | Status: DC
Start: 2019-10-17 — End: 2019-10-18
  Administered 2019-10-17 – 2019-10-18 (×2): 81 mg via ORAL

## 2019-10-17 MED ORDER — MORPHINE SULFATE (PF) 2 MG/ML IV SOLN
2 MG/ML | Freq: Once | INTRAVENOUS | Status: AC
Start: 2019-10-17 — End: 2019-10-17
  Administered 2019-10-17: 15:00:00 2 mg via INTRAVENOUS

## 2019-10-17 MED ORDER — ACETAMINOPHEN 650 MG RE SUPP
650 MG | Freq: Four times a day (QID) | RECTAL | Status: DC | PRN
Start: 2019-10-17 — End: 2019-10-18

## 2019-10-17 MED ORDER — ONDANSETRON HCL 4 MG/2ML IJ SOLN
4 MG/2ML | Freq: Once | INTRAMUSCULAR | Status: AC
Start: 2019-10-17 — End: 2019-10-16
  Administered 2019-10-17: 01:00:00 4 mg via INTRAVENOUS

## 2019-10-17 MED ORDER — ALBUTEROL SULFATE (2.5 MG/3ML) 0.083% IN NEBU
RESPIRATORY_TRACT | Status: DC | PRN
Start: 2019-10-17 — End: 2019-10-18

## 2019-10-17 MED ORDER — ROSUVASTATIN CALCIUM 10 MG PO TABS
10 MG | Freq: Every day | ORAL | Status: DC
Start: 2019-10-17 — End: 2019-10-18
  Administered 2019-10-17 – 2019-10-18 (×2): 10 mg via ORAL

## 2019-10-17 MED FILL — FENTANYL CITRATE (PF) 100 MCG/2ML IJ SOLN: 100 MCG/2ML | INTRAMUSCULAR | Qty: 2

## 2019-10-17 MED FILL — MORPHINE SULFATE 2 MG/ML IJ SOLN: 2 mg/mL | INTRAMUSCULAR | Qty: 1

## 2019-10-17 MED FILL — RANOLAZINE ER 500 MG PO TB12: 500 mg | ORAL | Qty: 2

## 2019-10-17 MED FILL — VANCOMYCIN HCL 1 G IV SOLR: 1 g | INTRAVENOUS | Qty: 1250

## 2019-10-17 MED FILL — VANCOMYCIN HCL 10 G IV SOLR: 10 g | INTRAVENOUS | Qty: 1250

## 2019-10-17 MED FILL — HYDROCODONE-ACETAMINOPHEN 5-325 MG PO TABS: 5-325 mg | ORAL | Qty: 1

## 2019-10-17 MED FILL — METOPROLOL TARTRATE 50 MG PO TABS: 50 mg | ORAL | Qty: 2

## 2019-10-17 MED FILL — BOOSTRIX 5-2.5-18.5 LF-MCG/0.5 IM SUSP: 5-2.5-18.5 LF-MCG/0.5 | INTRAMUSCULAR | Qty: 0.5

## 2019-10-17 MED FILL — SPIRONOLACTONE 25 MG PO TABS: 25 mg | ORAL | Qty: 1

## 2019-10-17 MED FILL — ONDANSETRON HCL 4 MG/2ML IJ SOLN: 4 MG/2ML | INTRAMUSCULAR | Qty: 2

## 2019-10-17 MED FILL — LOVENOX 40 MG/0.4ML SC SOLN: 40 MG/0.4ML | SUBCUTANEOUS | Qty: 0.4

## 2019-10-17 MED FILL — SILVER SULFADIAZINE 1 % EX CREA: 1 % | CUTANEOUS | Qty: 50

## 2019-10-17 MED FILL — CEFEPIME HCL 2 G IJ SOLR: 2 g | INTRAMUSCULAR | Qty: 2

## 2019-10-17 MED FILL — ROSUVASTATIN CALCIUM 10 MG PO TABS: 10 mg | ORAL | Qty: 1

## 2019-10-17 MED FILL — LISINOPRIL 20 MG PO TABS: 20 mg | ORAL | Qty: 2

## 2019-10-17 MED FILL — ASPIRIN LOW DOSE 81 MG PO TBEC: 81 mg | ORAL | Qty: 1

## 2019-10-17 MED FILL — XYLOCAINE/EPINEPHRINE 1 %-1:100000 IJ SOLN: 1 %-:00000 | INTRAMUSCULAR | Qty: 20

## 2019-10-17 MED FILL — VANCOMYCIN HCL 10 G IV SOLR: 10 g | INTRAVENOUS | Qty: 2000

## 2019-10-17 MED FILL — GABAPENTIN 300 MG PO CAPS: 300 mg | ORAL | Qty: 2

## 2019-10-17 MED FILL — PANTOPRAZOLE SODIUM 40 MG PO TBEC: 40 mg | ORAL | Qty: 1

## 2019-10-17 MED FILL — VANCOMYCIN HCL 5 G IV SOLR: 5 g | INTRAVENOUS | Qty: 1250
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
## 2019-10-17 NOTE — Progress Notes (Signed)
Clinical Pharmacy Note: Pharmacy to Dose Vancomycin    Rachael Mays is a 63 y.o. female started on Vancomycin; consult received from [facility], APRN - CNP to manage therapy. Also receiving the following antibiotics: cefepime.    Goal Trough Level: 15-20 mcg/mL    Assessment/Plan:  Initiate vancomycin 1250 mg IV every 8 hours.    A vancomycin trough has been ordered for 11/10@2000 .  Changes in regimen will be determined based on culture results, renal function, and clinical response.  Pharmacy will continue to monitor and adjust regimen as necessary.    Allergies:  Adhesive tape; Seasonal; and Talwin [pentazocine]         Recent Labs     10/16/19  1655   CREATININE 1.1       Recent Labs     10/16/19  1655   WBC 6.4       Ht Readings from Last 1 Encounters:   10/16/19 5\' 5"  (1.651 m)        Wt Readings from Last 1 Encounters:   10/16/19 300 lb (136.1 kg)         Estimated Creatinine Clearance: 75 mL/min (based on SCr of 1.1 mg/dL).      Thank you for the consult,    Annabelle Harman PharmD

## 2019-10-17 NOTE — Plan of Care (Signed)
Problem: Falls - Risk of:  Goal: Will remain free from falls  Description: Will remain free from falls  Outcome: Ongoing  Goal: Absence of physical injury  Description: Absence of physical injury  Outcome: Ongoing     Problem: Pain:  Goal: Pain level will decrease  Description: Pain level will decrease  Outcome: Ongoing  Goal: Control of acute pain  Description: Control of acute pain  Outcome: Ongoing  Goal: Control of chronic pain  Description: Control of chronic pain  Outcome: Ongoing     Problem: Skin Integrity - Impaired:  Goal: Will show no infection signs and symptoms  Description: Will show no infection signs and symptoms  Outcome: Ongoing  Goal: Absence of new skin breakdown  Description: Absence of new skin breakdown  Outcome: Ongoing

## 2019-10-17 NOTE — Plan of Care (Signed)
Problem: Falls - Risk of:  Goal: Will remain free from falls  Description: Will remain free from falls  10/17/2019 0827 by Beatrix Shipper, RN  Outcome: Ongoing  10/17/2019 0356 by Barbarann Ehlers, RN  Outcome: Ongoing  10/17/2019 0355 by Barbarann Ehlers, RN  Outcome: Ongoing  Goal: Absence of physical injury  Description: Absence of physical injury  10/17/2019 0827 by Beatrix Shipper, RN  Outcome: Ongoing  10/17/2019 0356 by Barbarann Ehlers, RN  Outcome: Ongoing  10/17/2019 0355 by Barbarann Ehlers, RN  Outcome: Ongoing     Problem: Pain:  Goal: Pain level will decrease  Description: Pain level will decrease  10/17/2019 0827 by Beatrix Shipper, RN  Outcome: Ongoing  10/17/2019 0356 by Barbarann Ehlers, RN  Outcome: Ongoing  10/17/2019 0355 by Barbarann Ehlers, RN  Outcome: Ongoing  Goal: Control of acute pain  Description: Control of acute pain  10/17/2019 0827 by Beatrix Shipper, RN  Outcome: Ongoing  10/17/2019 0356 by Barbarann Ehlers, RN  Outcome: Ongoing  10/17/2019 0355 by Barbarann Ehlers, RN  Outcome: Ongoing  Goal: Control of chronic pain  Description: Control of chronic pain  10/17/2019 0827 by Beatrix Shipper, RN  Outcome: Ongoing  10/17/2019 0356 by Barbarann Ehlers, RN  Outcome: Ongoing  10/17/2019 0355 by Barbarann Ehlers, RN  Outcome: Ongoing     Problem: Skin Integrity - Impaired:  Goal: Will show no infection signs and symptoms  Description: Will show no infection signs and symptoms  10/17/2019 0827 by Beatrix Shipper, RN  Outcome: Ongoing  10/17/2019 0356 by Barbarann Ehlers, RN  Outcome: Ongoing  10/17/2019 0355 by Barbarann Ehlers, RN  Outcome: Ongoing  Goal: Absence of new skin breakdown  Description: Absence of new skin breakdown  10/17/2019 0827 by Beatrix Shipper, RN  Outcome: Ongoing  10/17/2019 0356 by Barbarann Ehlers, RN  Outcome: Ongoing  10/17/2019 0355 by Barbarann Ehlers, RN  Outcome: Ongoing

## 2019-10-17 NOTE — Progress Notes (Signed)
Shift assessment completed. VSS. Patient resting in bed quietly. The care plan and education have been reviewed and mutually agreed upon with the patient. Call light in reach. Will continue to monitor. Beatrix Shipper RN
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
## 2019-10-17 NOTE — Progress Notes (Signed)
[place] [facility] PROGRESS NOTE    10/17/2019 12:35 PM        Name: Rachael Mays .              Admitted: 10/16/2019  Primary Care Provider: Almira Coaster, APRN - CNP (Tel: [phone])                        Subjective:  .    No acute events overnight. Resting well. Pain control. Diet ok.   Labs reviewed  Denies any chest pain sob.     Reviewed interval ancillary notes    Current Medications  aspirin EC tablet 81 mg, Daily  albuterol (PROVENTIL) nebulizer solution 2.5 mg, Q4H PRN  gabapentin (NEURONTIN) capsule 600 mg, Nightly  lisinopril (PRINIVIL;ZESTRIL) tablet 40 mg, Daily  metoprolol tartrate (LOPRESSOR) tablet 100 mg, BID  pantoprazole (PROTONIX) tablet 40 mg, QAM AC  ranolazine (RANEXA) extended release tablet 1,000 mg, BID  [START ON 10/18/2019] prasugrel (EFFIENT) tablet 10 mg, Daily  rosuvastatin (CRESTOR) tablet 10 mg, Daily  silver sulfADIAZINE (SILVADENE) 1 % cream, Daily  spironolactone (ALDACTONE) tablet 25 mg, Daily  sodium chloride flush 0.9 % injection 10 mL, 2 times per day  sodium chloride flush 0.9 % injection 10 mL, PRN  acetaminophen (TYLENOL) tablet 650 mg, Q6H PRN    Or  acetaminophen (TYLENOL) suppository 650 mg, Q6H PRN  polyethylene glycol (GLYCOLAX) packet 17 g, Daily PRN  promethazine (PHENERGAN) tablet 12.5 mg, Q6H PRN    Or  ondansetron (ZOFRAN) injection 4 mg, Q6H PRN  enoxaparin (LOVENOX) injection 40 mg, Daily  cefepime (MAXIPIME) 2 g IVPB minibag, Q12H  HYDROcodone-acetaminophen (NORCO) 5-325 MG per tablet 1 tablet, Q6H PRN  vancomycin (VANCOCIN) 1,250 mg in dextrose 5 % 250 mL IVPB, Q8H  lidocaine-EPINEPHrine 1 percent-1:100000 injection 20 mL, Once        Objective:  BP (!) 145/61    Pulse 73    Temp 97.9 ??F (36.6 ??C) (Oral)    Resp 16    Ht 5\' 5"  (1.651 m)    Wt (!) 315 lb 12.8 oz (143.2 kg)    SpO2 95%    BMI 52.55 kg/m??   No intake or output data in  the 24 hours ending 10/17/19 1236   Wt Readings from Last 3 Encounters:   10/17/19 (!) 315 lb 12.8 oz (143.2 kg)   10/09/19 (!) 316 lb 3.2 oz (143.4 kg)   10/06/19 (!) 318 lb (144.2 kg)       General appearance:  Appears comfortable  Eyes: Sclera clear. Pupils equal.  ENT: Moist oral mucosa. Trachea midline, no adenopathy.  Cardiovascular: Regular rhythm, normal S1, S2. No murmur. No edema in lower extremities  Respiratory: Not using accessory muscles. Good inspiratory effort. Clear to auscultation bilaterally, no wheeze or crackles.   GI: Abdomen soft, no tenderness, not distended, normal bowel sounds  Musculoskeletal: No cyanosis in digits, neck supple  Neurology: CN 2-12 grossly intact. No speech or motor deficits  Psych: Normal affect. Alert and oriented in time, place and person  Skin: Warm, dry, normal turgor    Labs and Tests:  CBC:   Recent Labs     10/16/19  1655 10/17/19  0608   WBC 6.4 5.5   HGB 12.9 12.0   PLT 247 236     BMP:    Recent Labs     10/16/19  1655 10/17/19  0607   NA 135* 140  K 5.0 4.6   CL 103 106   CO2 22 24   BUN 24* 21*   CREATININE 1.1 1.0   GLUCOSE 162* 118*     Hepatic:   Recent Labs     10/16/19  1655   AST 37   ALT 34   BILITOT 0.4   ALKPHOS 105       Discussed care with family and patient             Spent 30  minutes with patient and family at bedside and on unit reviewing medical records and labs, spent greater than 50% time counseling patient and family on diagnosis and plan   Problem List  Active Problems:    Cellulitis of left leg    Abscess of left leg  Resolved Problems:    * No resolved hospital problems. *       Assessment & Plan:   1. Left posterior thight wound  With infection  With abscess  - s/p I&D per gne surg  - continue iv abx for now  - goal to switch to oral pending culture        Diet: DIET GENERAL;  Code:Full Code  DVT PPX lovenox  Possible discharge tomrrow        Trena Platt, MD   10/17/2019 12:36 PM

## 2019-10-17 NOTE — Telephone Encounter (Signed)
Lov 10/06/2019  Lrf 08/29/2019 Disp 30+0  Appt 12/20/2018   Please advise thanks  Looks like this patient is still currently admitted.

## 2019-10-17 NOTE — Progress Notes (Signed)
Admission assessment complete, see flowsheet.  Patient in bed, no s/s of distress, respirations even and unlabored, VSS, open wound to left posterior thigh with erythema, induration and bloody serous drainage, wound rinsed with Vashe and dry dressing applied, Norco administered for pain.      The care plan and education has been reviewed and mutually agreed upon with the patient. All needs attended. Fall precautions in place, call light within reach. Will continue to monitor.

## 2019-10-17 NOTE — Plan of Care (Signed)
Problem: Falls - Risk of:  Goal: Will remain free from falls  Description: Will remain free from falls  10/17/2019 0356 by Barbarann Ehlers, RN  Outcome: Ongoing  10/17/2019 0355 by Barbarann Ehlers, RN  Outcome: Ongoing  Goal: Absence of physical injury  Description: Absence of physical injury  10/17/2019 0356 by Barbarann Ehlers, RN  Outcome: Ongoing  10/17/2019 0355 by Barbarann Ehlers, RN  Outcome: Ongoing     Problem: Pain:  Goal: Pain level will decrease  Description: Pain level will decrease  10/17/2019 0356 by Barbarann Ehlers, RN  Outcome: Ongoing  10/17/2019 0355 by Barbarann Ehlers, RN  Outcome: Ongoing  Goal: Control of acute pain  Description: Control of acute pain  10/17/2019 0356 by Barbarann Ehlers, RN  Outcome: Ongoing  10/17/2019 0355 by Barbarann Ehlers, RN  Outcome: Ongoing  Goal: Control of chronic pain  Description: Control of chronic pain  10/17/2019 0356 by Barbarann Ehlers, RN  Outcome: Ongoing  10/17/2019 0355 by Barbarann Ehlers, RN  Outcome: Ongoing     Problem: Skin Integrity - Impaired:  Goal: Will show no infection signs and symptoms  Description: Will show no infection signs and symptoms  10/17/2019 0356 by Barbarann Ehlers, RN  Outcome: Ongoing  10/17/2019 0355 by Barbarann Ehlers, RN  Outcome: Ongoing  Goal: Absence of new skin breakdown  Description: Absence of new skin breakdown  10/17/2019 0356 by Barbarann Ehlers, RN  Outcome: Ongoing  10/17/2019 0355 by Barbarann Ehlers, RN  Outcome: Ongoing

## 2019-10-18 ENCOUNTER — Encounter: Attending: Family | Primary: Family

## 2019-10-18 LAB — HEMOGLOBIN A1C
Hemoglobin A1C: 6.2 %
eAG: 131.2 mg/dL

## 2019-10-18 LAB — CBC WITH AUTO DIFFERENTIAL
Basophils %: 0.8 %
Basophils Absolute: 0 10*3/uL (ref 0.0–0.2)
Eosinophils %: 5.5 %
Eosinophils Absolute: 0.3 10*3/uL (ref 0.0–0.6)
Hematocrit: 37.2 % (ref 36.0–48.0)
Hemoglobin: 12.3 g/dL (ref 12.0–16.0)
Lymphocytes %: 18.8 %
Lymphocytes Absolute: 0.9 10*3/uL — ABNORMAL LOW (ref 1.0–5.1)
MCH: 29.6 pg (ref 26.0–34.0)
MCHC: 33.2 g/dL (ref 31.0–36.0)
MCV: 89.4 fL (ref 80.0–100.0)
MPV: 8.4 fL (ref 5.0–10.5)
Monocytes %: 14.3 %
Monocytes Absolute: 0.6 10*3/uL (ref 0.0–1.3)
Neutrophils %: 60.6 %
Neutrophils Absolute: 2.7 10*3/uL (ref 1.7–7.7)
Platelets: 219 10*3/uL (ref 135–450)
RBC: 4.16 M/uL (ref 4.00–5.20)
RDW: 13.9 % (ref 12.4–15.4)
WBC: 4.5 10*3/uL (ref 4.0–11.0)

## 2019-10-18 LAB — BASIC METABOLIC PANEL
Anion Gap: 11 (ref 3–16)
BUN: 17 mg/dL (ref 7–20)
CO2: 25 mmol/L (ref 21–32)
Calcium: 9.4 mg/dL (ref 8.3–10.6)
Chloride: 101 mmol/L (ref 99–110)
Creatinine: 1.1 mg/dL (ref 0.6–1.2)
GFR African American: >60 (ref >60)
GFR Non-African American: 50 — AB (ref >60)
Glucose: 140 mg/dL — ABNORMAL HIGH (ref 70–99)
Potassium: 5.1 mmol/L (ref 3.5–5.1)
Sodium: 137 mmol/L (ref 136–145)

## 2019-10-18 LAB — VANCOMYCIN LEVEL, TROUGH: Vancomycin Tr: 21.7 ug/mL (ref 10.0–20.0)

## 2019-10-18 MED ORDER — HYDROCODONE-ACETAMINOPHEN 5-325 MG PO TABS
5-325 MG | Freq: Four times a day (QID) | ORAL | Status: DC | PRN
Start: 2019-10-18 — End: 2019-10-18
  Administered 2019-10-18 (×2): 2 via ORAL

## 2019-10-18 MED ORDER — HYDROCODONE-ACETAMINOPHEN 5-325 MG PO TABS
5-325 MG | ORAL_TABLET | Freq: Four times a day (QID) | ORAL | 0 refills | Status: AC | PRN
Start: 2019-10-18 — End: 2019-10-23

## 2019-10-18 MED ORDER — LINEZOLID 600 MG PO TABS
600 MG | ORAL_TABLET | Freq: Two times a day (BID) | ORAL | 0 refills | Status: AC
Start: 2019-10-18 — End: 2019-10-28
  Filled 2019-10-18: qty 20, 10d supply, fill #0

## 2019-10-18 MED ORDER — DEXTROSE 5 % IV SOLN
5 % | Freq: Two times a day (BID) | INTRAVENOUS | Status: DC
Start: 2019-10-18 — End: 2019-10-18

## 2019-10-18 MED ORDER — METOPROLOL TARTRATE 50 MG PO TABS
50 MG | Freq: Two times a day (BID) | ORAL | Status: DC
Start: 2019-10-18 — End: 2019-10-18

## 2019-10-18 MED FILL — HYDROcodone/APAP 5-325MG TAB: 5-325 mg | ORAL | 5 days supply | Qty: 20 | Fill #0 | Status: AC

## 2019-10-18 MED FILL — RANOLAZINE ER 500 MG PO TB12: 500 mg | ORAL | Qty: 2

## 2019-10-18 MED FILL — CEFEPIME HCL 2 G IJ SOLR: 2 g | INTRAMUSCULAR | Qty: 2

## 2019-10-18 MED FILL — EFFIENT 10 MG PO TABS: 10 mg | ORAL | Qty: 1

## 2019-10-18 MED FILL — VANCOMYCIN HCL 1 G IV SOLR: 1 g | INTRAVENOUS | Qty: 1250

## 2019-10-18 MED FILL — LISINOPRIL 20 MG PO TABS: 20 mg | ORAL | Qty: 2

## 2019-10-18 MED FILL — LOVENOX 40 MG/0.4ML SC SOLN: 40 MG/0.4ML | SUBCUTANEOUS | Qty: 0.4

## 2019-10-18 MED FILL — ASPIRIN LOW DOSE 81 MG PO TBEC: 81 mg | ORAL | Qty: 1

## 2019-10-18 MED FILL — ROSUVASTATIN CALCIUM 10 MG PO TABS: 10 mg | ORAL | Qty: 1

## 2019-10-18 MED FILL — HYDROCODONE-ACETAMINOPHEN 5-325 MG PO TABS: 5-325 mg | ORAL | Qty: 1

## 2019-10-18 MED FILL — HYDROCODONE-ACETAMINOPHEN 5-325 MG PO TABS: 5-325 mg | ORAL | Qty: 2

## 2019-10-18 MED FILL — GABAPENTIN 300 MG PO CAPS: 300 mg | ORAL | Qty: 2

## 2019-10-18 MED FILL — METOPROLOL TARTRATE 50 MG PO TABS: 50 mg | ORAL | Qty: 2

## 2019-10-18 MED FILL — SPIRONOLACTONE 25 MG PO TABS: 25 mg | ORAL | Qty: 1

## 2019-10-18 MED FILL — METOPROLOL TARTRATE 50 MG PO TABS: 50 mg | ORAL | Qty: 1

## 2019-10-18 MED FILL — PANTOPRAZOLE SODIUM 40 MG PO TBEC: 40 mg | ORAL | Qty: 1
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
## 2019-10-18 NOTE — Care Coordination-Inpatient (Signed)
CM submitted request to Cover My Meds for Linezolid po at discharge using key code [id number].    Azucena Kuba, BSN, CCM, RN  [facility]  [phone]

## 2019-10-18 NOTE — Plan of Care (Signed)
Problem: Falls - Risk of:  Goal: Will remain free from falls  Description: Will remain free from falls  10/18/2019 1547 by Earney Navy, RN  Outcome: Completed  10/18/2019 1003 by Beatrix Shipper, RN  Outcome: Ongoing  Goal: Absence of physical injury  Description: Absence of physical injury  10/18/2019 1547 by Earney Navy, RN  Outcome: Completed  10/18/2019 1003 by Beatrix Shipper, RN  Outcome: Ongoing     Problem: Pain:  Goal: Pain level will decrease  Description: Pain level will decrease  10/18/2019 1547 by Earney Navy, RN  Outcome: Completed  10/18/2019 1003 by Beatrix Shipper, RN  Outcome: Ongoing  Goal: Control of acute pain  Description: Control of acute pain  10/18/2019 1547 by Earney Navy, RN  Outcome: Completed  10/18/2019 1003 by Beatrix Shipper, RN  Outcome: Ongoing  Goal: Control of chronic pain  Description: Control of chronic pain  10/18/2019 1547 by Earney Navy, RN  Outcome: Completed  10/18/2019 1003 by Beatrix Shipper, RN  Outcome: Ongoing     Problem: Skin Integrity - Impaired:  Goal: Will show no infection signs and symptoms  Description: Will show no infection signs and symptoms  10/18/2019 1547 by Earney Navy, RN  Outcome: Completed  10/18/2019 1003 by Beatrix Shipper, RN  Outcome: Ongoing  Goal: Absence of new skin breakdown  Description: Absence of new skin breakdown  10/18/2019 1547 by Earney Navy, RN  Outcome: Completed  10/18/2019 1003 by Beatrix Shipper, RN  Outcome: Ongoing

## 2019-10-18 NOTE — Progress Notes (Signed)
Call to surgery to make them aware of patient's wound culture results.

## 2019-10-18 NOTE — Discharge Instructions (Addendum)
Continuity of Care Form    Patient Name: Rachael Mays   DOB:  [birth year]  MRN:  [id number]    Admit date:  10/16/2019  Discharge date:  10/18/19    Code Status Order: Full Code   Advance Directives:   [place] Documentation     Date/Time Healthcare Directive Type of Healthcare Directive Copy in [place] Agent's Name Healthcare Agent's Phone Number    10/17/19 0107  No, patient does not have an advance directive for healthcare treatment -- -- -- -- --          Admitting Physician:  Charlane Ferretti, MD  PCP: Almira Coaster, APRN - CNP    Discharging Nurse: Vito Backers RN / [facility] Unit/Room#: 4TN-4463/4463-01  Discharging Unit Phone Number: [phone]    Emergency Contact:   Extended Emergency Contact Information  Primary Emergency Contact: Mays,Rachael  Home Phone: [phone]  Relation: Child  Secondary Emergency Contact: [name] Phone: [phone]  Relation: Child  Interpreter needed? No    Past Surgical History:  Past Surgical History:   Procedure Laterality Date   ??? BACK SURGERY      neck  plates   ??? BREAST SURGERY      left lumpectomy   ??? CESAREAN SECTION     ??? CHOLECYSTECTOMY     ??? COLONOSCOPY     ??? COLONOSCOPY  04/08/10   ??? CORONARY ANGIOPLASTY     ??? DIAGNOSTIC CARDIAC CATH LAB PROCEDURE     ??? DILATATION, ESOPHAGUS     ??? ENDOSCOPY, COLON, DIAGNOSTIC     ??? HYSTERECTOMY, VAGINAL     ??? LAP BAND  05/01/08    Dr. Nicole Cella   ??? OTHER SURGICAL HISTORY      [place] Filter: curently in place   ??? OTHER SURGICAL HISTORY  07/05/13    lap band removal   ??? SHOULDER SURGERY     ??? SKIN CANCER EXCISION  12/29/2017   ??? UPPER GASTROINTESTINAL ENDOSCOPY  05/19/13   ??? UPPER GASTROINTESTINAL ENDOSCOPY  07/21/13    ESOPHAGEAL STENT PLACEMENT   ??? UPPER GASTROINTESTINAL ENDOSCOPY N/A 07/31/2014    Esophagogastroduodenoscopy with esophageal balloon dilation   ??? VASCULAR SURGERY  04/2015    Lucendia Herrlich, celiac artery angiogram, normal abd arteries        Immunization History:   Immunization History   Administered Date(s) Administered   ??? Influenza, Intradermal, Quadrivalent, Preservative Free 11/03/2016   ??? Influenza, Quadv, IM, PF (6 mo and older Fluzone, Flulaval, Fluarix, and 3 yrs and older Afluria) 09/15/2019   ??? Influenza, Quadv, Recombinant, IM PF (Flublok 18 yrs and older) 08/26/2017   ??? Pneumococcal Conjugate 13-valent (Prevnar13) 11/03/2016   ??? Pneumococcal Polysaccharide (Pneumovax23) 07/13/2019   ??? Tdap (Boostrix, Adacel) 11/07/2011, 10/16/2019       Active Problems:  Patient Active Problem List   Diagnosis Code   ??? Back pain M54.9   ??? Abdominal  pain, other specified site R10.9   ??? Morbid obesity with body mass index (BMI) of 45.0 to 49.9 in adult (HCC) E66.01, Z68.42   ??? 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 Tinnitus, subjective H93.19   ??? Essential hypertension I10   ??? 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 Adrenal mass, left (HCC) E27.8   ??? Adrenal nodule (HCC) E27.8   ??? Obstructive sleep apnea G47.33   ??? Abnormal stress test R94.39   ??? Chronic diastolic congestive heart failure (HCC) I50.32   ???  Coronary artery disease due to lipid rich plaque I25.10, I25.83   ??? Pulmonary hypertension I27.20   ??? Status post insertion of drug-eluting stent into left anterior descending (LAD) artery Z95.5   ??? Anxiety and depression F41.9, F32.9   ??? Bradycardia R00.1   ??? Dyslipidemia E78.5   ??? Chronic eczematous otitis externa of both ears H60.8X3   ??? Mucositis K12.30   ??? Sprain of radiocarpal joint of right wrist S63.521A   ??? Idiopathic peripheral neuropathy G60.9   ??? Chronic pain syndrome G89.4   ??? Unstable angina (HCC) I20.0   ??? 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 Displacement of thoracic intervertebral disc without myelopathy M51.24   ??? 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 Disc displacement, thoracic M51.24   ??? Bilateral leg numbness R20.0   ??? Chest pain with high risk for cardiac etiology R07.9   ??? Cellulitis of left leg L03.116    ??? Abscess of left leg L02.416       Isolation/Infection:   Isolation          Contact        Patient Infection Status     Infection Onset Added Last Indicated Last Indicated By Review Planned Expiration Resolved Resolved By    MRSA 10/09/19 10/11/19 10/09/19 Culture, Wound              Nurse Assessment:  Last Vital Signs: BP (!) 108/53    Pulse 62    Temp 97.8 ??F (36.6 ??C) (Oral)    Resp 16    Ht 5\' 5"  (1.651 m)    Wt (!) 315 lb 12.8 oz (143.2 kg)    SpO2 94%    BMI 52.55 kg/m??     Last documented pain score (0-10 scale): Pain Level: 5  Last Weight:   Wt Readings from Last 1 Encounters:   10/17/19 (!) 315 lb 12.8 oz (143.2 kg)     Mental Status:  oriented and alert    IV Access:  - None    Nursing Mobility/ADLs:  Walking   independent  Transfer  Independent  Bathing  [place]  Independent  Med Delivery   whole    Wound Care Documentation and Therapy:  Wound 10/17/19 Thigh Left;Posterior (Active)   Wound Etiology Burn 10/17/19 2145   Dressing Status Clean;Dry;Intact 10/18/19 0915   Wound Cleansed Wound cleanser 10/18/19 0915   Dressing/Treatment Moist to dry 10/18/19 0915   Drainage Amount None 10/18/19 0915   Drainage Description Serosanguinous 10/17/19 2145   Odor None 10/18/19 0915   Peri-wound Assessment Induration 10/17/19 0100   Number of days: 1        Elimination:  Continence:   ?? Bowel: Yes  ?? Bladder: Yes  Urinary Catheter: None   Colostomy/Ileostomy/Ileal Conduit: No       Date of Last BM:     Intake/Output Summary (Last 24 hours) at 10/18/2019 1009  Last data filed at 10/17/2019 2220  Gross per 24 hour   Intake 340 ml   Output --   Net 340 ml     I/O last 3 completed shifts:  In: 340 [P.O.:240; IV Piggyback:100]  Out: -     Safety Concerns:     None    Impairments/Disabilities:      None    Nutrition Therapy:  Current Nutrition Therapy:   - Oral Diet:  General  Routes of Feeding: Oral  Liquids: Thin Liquids  Daily Fluid  Restriction: no  Last Modified Barium Swallow with Video (Video Swallowing Test): not done    Treatments at the Time of Hospital Discharge:   Respiratory Treatments:   Oxygen Therapy:  is not on home oxygen therapy.  Ventilator:    - No ventilator support    Rehab Therapies:   Weight Bearing Status/Restrictions: No weight bearing restirctions  Other Medical Equipment (for information only, NOT a DME order):    Other Treatments: wet to dry with normal saline dressing twice a day    Patient's personal belongings (please select all that are sent with patient):  None    RN SIGNATURE:  Electronically signed by Earney Navy, RN on 10/18/19 at [place] PM EST    CASE MANAGEMENT/SOCIAL WORK SECTION    Inpatient Status Date: ***    Readmission Risk Assessment Score:  Readmission Risk              Risk of Unplanned Readmission:        18           Discharging to Facility/ Agency   ?? Name:   ?? Address:  ?? Phone:  ?? Fax:    Dialysis Facility (if applicable)   ?? Name:  ?? Address:  ?? Dialysis Schedule:  ?? Phone:  ?? Fax:    Case Manager/Social Worker signature: {Esignature:304088025}    PHYSICIAN SECTION    Prognosis: {[phone]}    Condition at Discharge: [place] Patient Condition:304088024}    Rehab Potential (if transferring to Rehab): {[phone]}    Recommended Labs or Other Treatments After Discharge: ***    Physician Certification: I certify the above information and transfer of Rachael Mays  is necessary for the continuing treatment of the diagnosis listed and that she requires {Admit to Appropriate Level of Care:20763} for {GREATER/LESS:304500278} 30 days.     Update Admission H&P: {CHP DME Changes in [place]:705696    PHYSICIAN SIGNATURE:  {Esignature:304088025}

## 2019-10-18 NOTE — Progress Notes (Signed)
Message sent to hospitalist to make him aware of patient's wound culture results.

## 2019-10-18 NOTE — Progress Notes (Signed)
Resting quietly in bed, eyes closed, respirations even, easily awakens, pleasant.  Dressing D/I to left posterior thigh, c/o level 7/10 pain area, says pain is improving and tolerable, received pain med at 2243.  VSS.  Assessment completed, see flow charts.[email or web address]

## 2019-10-18 NOTE — Progress Notes (Signed)
CLINICAL PHARMACY NOTE: MEDS TO [place] [facility] Patient?: No  Total # of Prescriptions Filled: 2   The following medications were delivered to the patient:  ?? linezolid 600mg   ?? Hydrocodone 5/325mg   Total # of Interventions Completed: 1  Time Spent (min): 60    Additional Documentation:  linezolid required Prior Auth with insurance. Tomi Bamberger in CM submitted information and received approval.  Medications delivered- RN Almyra Free L. Signed  Doretha Sou CphT
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
## 2019-10-18 NOTE — Plan of Care (Signed)
Problem: Falls - Risk of:  Goal: Will remain free from falls  Description: Will remain free from falls  10/18/2019 1003 by Beatrix Shipper, RN  Outcome: Ongoing  10/17/2019 2316 by Barbarann Ehlers, RN  Outcome: Ongoing  Goal: Absence of physical injury  Description: Absence of physical injury  10/18/2019 1003 by Beatrix Shipper, RN  Outcome: Ongoing  10/17/2019 2316 by Barbarann Ehlers, RN  Outcome: Ongoing     Problem: Pain:  Goal: Pain level will decrease  Description: Pain level will decrease  10/18/2019 1003 by Beatrix Shipper, RN  Outcome: Ongoing  10/17/2019 2316 by Barbarann Ehlers, RN  Outcome: Ongoing  Goal: Control of acute pain  Description: Control of acute pain  10/18/2019 1003 by Beatrix Shipper, RN  Outcome: Ongoing  10/17/2019 2316 by Barbarann Ehlers, RN  Outcome: Ongoing  Goal: Control of chronic pain  Description: Control of chronic pain  10/18/2019 1003 by Beatrix Shipper, RN  Outcome: Ongoing  10/17/2019 2316 by Barbarann Ehlers, RN  Outcome: Ongoing     Problem: Skin Integrity - Impaired:  Goal: Will show no infection signs and symptoms  Description: Will show no infection signs and symptoms  10/18/2019 1003 by Beatrix Shipper, RN  Outcome: Ongoing  10/17/2019 2316 by Barbarann Ehlers, RN  Outcome: Ongoing  Goal: Absence of new skin breakdown  Description: Absence of new skin breakdown  10/18/2019 1003 by Beatrix Shipper, RN  Outcome: Ongoing  10/17/2019 2316 by Barbarann Ehlers, RN  Outcome: Ongoing

## 2019-10-18 NOTE — Care Coordination-Inpatient (Addendum)
Per patient's RN,Cassandra.  Patient needs assistance at home with Left posterior thigh wound.  SW met with patient and patient in agreement with this plan.      The Plan for Transition of Care is related to the following treatment goals: Home Care for Nursing.    The Patient and/or patient was provided with a choice of provider and agrees   with the discharge plan. '[x]'  Yes '[]'  No    Freedom of choice list was provided with basic dialogue that supports the patient's individualized plan of care/goals, treatment preferences and shares the quality data associated with the providers. '[x]'  Yes '[]'  No    Referred patient to [facility].    [facility]  [phone]  [phone] (Fax)    Electronically signed by Charleston Ropes, MSW on 10/18/2019 at 11:24 AM     ADDENDUM:  [facility] can not accept patient due to nursing shortage.  SW also contacted [facility], [facility], [facility], [facility], Emergency planning/management officer. None of these agencies can service the patient.     SW informed patient's RN, Vito Backers.    Electronically signed by Charleston Ropes, MSW on 10/18/2019 at 4:04 PM

## 2019-10-18 NOTE — Progress Notes (Signed)
Shift assessment completed. VSS. Dressing to L posterior thigh changed. The care plan and education have been reviewed and mutually agreed upon with the patient. Call light in reach. Will continue to monitor. Garner Gavel RN

## 2019-10-18 NOTE — Progress Notes (Signed)
Clinical Pharmacy Note: Pharmacy to Dose Vancomcyin    Vancomycin Day: 3  Current Dosing: 1250 mg q8h --> changed to 1250 mg q12h  Indication: cellulitis with purulent wound, goal trough 10-15 mcg/mL      Recent Labs     10/16/19  1655 10/17/19  0607   BUN 24* 21*     Recent Labs     10/16/19  1655 10/17/19  0607   CREATININE 1.1 1.0     Recent Labs     10/16/19  1655 10/17/19  0608   WBC 6.4 5.5       Intake/Output Summary (Last 24 hours) at 10/18/2019 0705  Last data filed at 10/17/2019 2220  Gross per 24 hour   Intake 340 ml   Output --   Net 340 ml     Ht: 5'5"  Wt: 143.2 kg  BMI: 52.55 kg/m2    Estimated Creatinine Clearance: 85 mL/min (based on SCr of 1 mg/dL).     Trough: 21.7 mcg/mL (7 hr level on 1250 mg q8h)    Assessment/Plan:  ??? Vancomycin level is supratherapeutic. Level was drawn appropriately in respect to last dose given.   ??? A vancomycin trough has been ordered for 11/13 AM  ??? Will Decrease vancomycin dose to 1250 mg q12h to start today at 1800  ??? Changes in regimen will be determined based on culture results, renal function, and clinical response.  ??? Pharmacy will continue to monitor and adjust regimen as necessary.      Fransisca Connors, PharmD  PGY1 Pharmacy Resident  10/18/2019 7:10 AM

## 2019-10-19 LAB — CULTURE, WOUND

## 2019-10-19 NOTE — Telephone Encounter (Signed)
 Dr. Thora Lance notified

## 2019-10-19 NOTE — Telephone Encounter (Signed)
Error

## 2019-10-19 NOTE — Telephone Encounter (Signed)
[facility] Transitions Initial Follow Up Call    Outreach made within 2 business days of discharge: Yes    Patient: Rachael Mays Patient DOB: Aug 10, 1956   MRN: [id number]  Reason for Admission: There are no discharge diagnoses documented for the most recent discharge.  Discharge Date: 10/18/19       Spoke with: left voicemail    Discharge department/facility: [facility]    Scheduled appointment with PCP within 7-14 days    Follow Up  Future Appointments   Date Time Provider [place]   10/26/2019  4:45 PM Francisca December, MD [facility]   11/01/2019  1:00 PM Marguarite Arbour Sheila Oats, APRN - NP [facility]   12/21/2019  2:15 PM Leodis Binet, MD [facility]   01/10/2020  1:30 PM Francisca December, MD [facility] [place]

## 2019-10-19 NOTE — Telephone Encounter (Signed)
Patient cancelled upcoming appointment for this issue. Update:  Went to the ER 11/09-11/11 and had a small surgery because the area was affected with MRSA and cut everything out.    [phone]

## 2019-10-21 LAB — CULTURE, BLOOD 1: Blood Culture, Routine: NO GROWTH

## 2019-10-21 LAB — CULTURE, BLOOD 2: Culture, Blood 2: NO GROWTH

## 2019-10-23 NOTE — Discharge Instructions (Signed)
[facility]  [place], [place] [address]  Telephone: [phone]     FAX [phone]     Discharge Instructions:  Keep weight off wounds and reposition every 2 hours if applicable.  Avoid standing for long periods of time.     If wound(s) is on your lower extremity, elevate legs to the level of the heart or above for 30 minutes 4-5 times a day and/or when sitting.     Do not get wounds wet in bath or shower unless otherwise instructed by your physician.If your wound is on you foot or leg, you may purchase a cast bag. Please ask at the pharmacy.    When taking antibiotics take entire prescription as ordered by MD do not stop taking until medicine is all gone. Exercise as tolerated. No Smoking. Smoking prohibits wound healing.    If Vascular testing is ordered, please call [phone]) to schedule.    Vascular tests ordered by Wound Care Physicians may take up to 2 hours to complete. Please keep that in mind when scheduling.     If Vascular testing is scheduled, please bring supplies to replace your dressing after testing is done. The vascular department does not stock supplies.     Wound:      With each dressing change, rinse wounds with 0.9% Saline. (May use wound wash or soft contact solution. Both can be purchased at a local drug store). If unable to obtain saline, may use a gentle soap and water.    Dressing care:     Important dietary reminders:  1. Increase Protein intake for optimal wound healing  2. No added salt to reduce any swelling  3. If diabetic, good glucose control  4. If you smoke, smoking affects wound healing, we ask that you refrain from smoking.    Follow up with Dr Marin Comment In 1 week in the wound care center.     Call [phone] for any questions or concerns.     Your Case Manager is Hope Information: Should you experience any significant changes in your wound(s) or have questions about your  wound care, please contact the [facility] at [phone] Monday  - Thursday 8:00 am - 4:00 pm and Friday 8:00 am - 1:00pm. If you need help with your wound outside these hours and cannot wait until we are again available, contact your PCP or go to the hospital emergency room.     PLEASE NOTE: IF YOU ARE UNABLE TO OBTAIN WOUND SUPPLIES, CONTINUE TO USE THE SUPPLIES YOU HAVE AVAILABLE UNTIL YOU ARE ABLE TO REACH [place]. IT IS MOST IMPORTANT TO KEEP THE WOUND COVERED AT ALL TIMES.

## 2019-10-25 ENCOUNTER — Inpatient Hospital Stay: Payer: PRIVATE HEALTH INSURANCE | Attending: Emergency Medicine | Primary: Family

## 2019-10-25 NOTE — Telephone Encounter (Signed)
Patient requests medication for yeast infection. She has been on antibiotics recently and she is itching in her private area, as well as she has an itchy yeast rash under her stomach and in her groin area where her underwear is. Send to [facility] in [place].

## 2019-10-26 ENCOUNTER — Encounter: Attending: Dermatology | Primary: Family

## 2019-10-26 MED ORDER — FLUCONAZOLE 150 MG PO TABS
150 MG | ORAL_TABLET | ORAL | 0 refills | Status: AC
Start: 2019-10-26 — End: 2019-10-30

## 2019-10-26 NOTE — Telephone Encounter (Signed)
Pt informed through vm. Looks like pt is going somewhere else to establish.

## 2019-10-26 NOTE — Telephone Encounter (Signed)
Diflucan sent to pharmacy

## 2019-10-30 ENCOUNTER — Encounter: Attending: Nurse Practitioner | Primary: Family

## 2019-10-31 ENCOUNTER — Inpatient Hospital Stay
Admit: 2019-10-31 | Discharge: 2019-10-31 | Payer: PRIVATE HEALTH INSURANCE | Attending: Emergency Medicine | Primary: Family

## 2019-10-31 DIAGNOSIS — T8189XA Other complications of procedures, not elsewhere classified, initial encounter: Secondary | ICD-10-CM

## 2019-10-31 MED ORDER — LIDOCAINE 4 % EX CREA
4 % | Freq: Once | CUTANEOUS | Status: DC
Start: 2019-10-31 — End: 2019-11-01

## 2019-10-31 NOTE — H&P (Signed)
[place]   Initial wound care visit history and physical      Rachael Mays RECORD NUMBER:  [id number]  AGE: 63 y.o.   GENDER: female  DOB: 28-Aug-1956  EPISODE DATE:  10/31/2019    Subjective:     Chief Complaint   Patient presents with   ??? Wound Check     Left thigh wound 2-3 weeks. Sent by Dr Anders Simmonds      Patient is presenting for evaluation of a wound to the left posterior thigh.  Wound has been present for at least 4 to 5 weeks.  This is her initial wound care office visit.    Assessment:     63 year old female who sustained a burn to the left posterior thigh from a heating Rice sachet approximately 6 weeks prior to presentation here.  The patient then developed an abscess approximately 4 to 5 weeks prior to presentation here and was followed by several different providers including dermatology, primary care.  Wound and abscess became worse requiring hospitalization 10/16/2019-10/23/2019.  The patient is status post incision and drainage by general surgery on 10/17/2019, done at the bedside.  Patient has remaining wound to left posterior thigh.    Nonhealing surgical wound to left posterior thigh.  Severity of fat layers exposed  Wound present since 10/16/2019    Patient Active Problem List   Diagnosis Code   ??? Back pain M54.9   ??? Abdominal  pain, other specified site R10.9   ??? Morbid obesity with body mass index (BMI) of 45.0 to 49.9 in adult (HCC) E66.01, Z68.42   ??? 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 Tinnitus, subjective H93.19   ??? Essential hypertension I10   ??? 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 Adrenal mass, left (HCC) E27.8   ??? Adrenal nodule (HCC) E27.8   ??? Obstructive sleep apnea G47.33   ??? Abnormal stress test R94.39   ??? Chronic diastolic congestive heart failure (HCC) I50.32   ??? Coronary artery disease due to lipid rich plaque I25.10, I25.83   ??? Pulmonary hypertension I27.20   ??? Status post insertion of drug-eluting stent into left anterior descending  (LAD) artery Z95.5   ??? Anxiety and depression F41.9, F32.9   ??? Bradycardia R00.1   ??? Dyslipidemia E78.5   ??? Chronic eczematous otitis externa of both ears H60.8X3   ??? Mucositis K12.30   ??? Sprain of radiocarpal joint of right wrist S63.521A   ??? Idiopathic peripheral neuropathy G60.9   ??? Chronic pain syndrome G89.4   ??? Unstable angina (HCC) I20.0   ??? 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 Displacement of thoracic intervertebral disc without myelopathy M51.24   ??? 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 Disc displacement, thoracic M51.24   ??? Bilateral leg numbness R20.0   ??? Chest pain with high risk for cardiac etiology R07.9   ??? Cellulitis of left leg L03.116   ??? Abscess of left leg L02.416   ??? Nonhealing surgical wound, initial encounter T81.89XA   ??? 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  Overlying intersperse fibrous exudative nonviable tissue.  This required debridement.  Surrounding skin with some mild pink discoloration consistent with scarring.  No fluctuance appreciated.    Plan:  1. Wound care: Debridement done (see note below). Please see attached Discharge Instructions for specific wound treatment plan  2. Offloading and edema control: Avoid all pressure and trauma to the wound.  Discussed with patient in detail.  Float left posterior thigh  area is much as possible.  3. Infection: Completed IV antibiotic course, as well as oral Zyvox.  No signs of acute infection.  Continue to monitor  4. Circulation: Pressure-relief to help local skin perfusion   5. Nutrition: Encourage protein emphasis with meals. Patient is a nondiabetic.    Procedure Note  Indications:  Based on my examination of this patient's wound(s)/ulcer(s) today, debridement is required to promote healing and evaluate the wound  base.  Performed by: [facility]  Kasheena Sambrano, MD  Consent obtained:  Yes  Time out taken:  Yes  Pain Control: Anesthetic  Anesthetic: 4% Lidocaine Cream     Debridement: Excisional Debridement  Using curette the wound(s)/ulcer(s) was/were debrided down through and including the removal of subcutaneous tissue.        Devitalized Tissue Debrided:  fibrin, slough, necrotic/eschar and exudate    Pre Debridement Measurements:  Are located in the [facility] Documentation Flow Sheet    Wound/Ulcer #: 1    Post Debridement Measurements:  Wound/Ulcer Descriptions are Pre Debridement except measurements:    Wound 10/17/19 Thigh Left;Posterior #1 (Active)   Wound Image   10/31/19 1414   Wound Etiology Non-Healing Surgical 10/31/19 1414   Dressing Status Clean;Dry;Intact 10/18/19 0915   Wound Cleansed Cleansed with saline 10/31/19 1414   Dressing/Treatment Moist to dry 10/18/19 0915   Wound Length (cm) 2.2 cm 10/31/19 1414   Wound Width (cm) 2.5 cm 10/31/19 1414   Wound Depth (cm) 0.2 cm 10/31/19 1414   Wound Surface Area (cm^2) 5.5 cm^2 10/31/19 1414   Wound Volume (cm^3) 1.1 cm^3 10/31/19 1414   Post-Procedure Length (cm) 2.2 cm 10/31/19 1416   Post-Procedure Width (cm) 2.5 cm 10/31/19 1416   Post-Procedure Depth (cm) 0.3 cm 10/31/19 1416   Post-Procedure Surface Area (cm^2) 5.5 cm^2 10/31/19 1416   Post-Procedure Volume (cm^3) 1.65 cm^3 10/31/19 1416   Wound Assessment Bleeding 10/31/19 1416   Drainage Amount Moderate 10/31/19 1416   Drainage Description Serosanguinous 10/31/19 1416   Odor None 10/31/19 1414   Peri-wound Assessment Hyperpigmented 10/31/19 1414   Margins Attached edges;Defined edges 10/31/19 1414   Wound Thickness Description not for Pressure Injury Full thickness 10/31/19 1414   Number of days: 14          Percent of Wound(s)/Ulcer(s) Debrided: 100%  Total Surface Area Debrided:  5.5 sq cm   Diabetic/Pressure/Non Pressure Ulcers only:  Ulcer: Non-Pressure ulcer, fat layer exposed   Estimated Blood Loss:   Minimal  Hemostasis Achieved:  by pressure  Procedural Pain:  0  / 10   Post Procedural Pain:  0 / 10   Response to treatment:  Well tolerated by patient.      HISTORY of PRESENT ILLNESS HPI     DYNASTY MEES is a 63 y.o. female who presents today for wound/ulcer evaluation.     History of Wound Context: 63 year old female who presents for an evaluation of a nonhealing wound to the posterior left thigh.  The patient initially sustained a burn from a heat rice sachet.  This evolved into an abscess progressively getting worse over the course of 6 weeks.  The patient required admission early November 2020.  She had bedside incision and drainage by general surgery.  She was on IV antibiotics and then was sent home with p.o. Zyvox.  Denies any constitutional complaints.  No fever, chills.  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    Wound/Ulcer Pain Timing/Severity: none  Quality of pain: N/A  Severity:  0 / 10   Modifying Factors: None  Associated Signs/Symptoms: none    Ulcer Identification:  Ulcer Type: non-healing surgical  Contributing Factors: edema, decreased mobility and obesity  Acute Wound: N/A not an acute wound    Objective:    BP 102/64    Pulse 59    Temp 96.7 ??F (35.9 ??C) (Temporal)    Resp 16    Wt (!) 312 lb 6.4 oz (141.7 kg)    BMI 51.99 kg/m??   Wt Readings from Last 3 Encounters:   10/31/19 (!) 312 lb 6.4 oz (141.7 kg)   10/17/19 (!) 315 lb 12.8 oz (143.2 kg)   10/09/19 (!) 316 lb 3.2 oz (143.4 kg)       PHYSICAL EXAM  General Appearance: alert and oriented to person, place and time, well-developed and well nourished and in no acute distress  Head: normocephalic and atraumatic  Eyes: pupils equal, round, and reactive to light, extraocular eye movements intact, conjunctivae normal  ENT: oropharynx clear and moist with normal mucous membranes  Pulmonary/Chest: clear to auscultation bilaterally- no wheezes, rales or rhonchi, normal air movement, no respiratory  distress  Cardiovascular: normal rate and normal S1 and S2  Abdomen: soft, non-tender and non-distended  Extremities: no cyanosis, no clubbing and no edema    PAST MEDICAL HISTORY        Diagnosis Date   ??? Anxiety    ??? Asthma    ??? Blood circulation, collateral    ??? CAD (coronary artery disease)    ??? CHF (congestive heart failure) ([place])    ??? Chronic back pain    ??? Deep vein thrombosis    ??? 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 Depression    ??? GERD (gastroesophageal reflux disease)     NO LONGER SINCE LAP   ??? GERD (gastroesophageal reflux disease) 01/23/2009   ??? Hx of blood clots    ??? Hyperlipidemia     hx; resolved with lap band   ??? Hypertension    ??? Idiopathic peripheral neuropathy 03/26/2017   ??? Melanoma ([place])     upper back   ??? MRSA (methicillin resistant staph aureus) culture positive 10/17/2019    leg abscess   ??? Obesity     hx of; had lap band   ??? Obstructive sleep apnea 09/18/2015     Updating Deprecated Diagnoses   ??? Obstructive sleep apnea (adult) (pediatric) 09/18/2015   ??? Pulmonary embolism ([place])        PAST SURGICAL HISTORY    Past Surgical History:   Procedure Laterality Date   ??? BACK SURGERY      neck  plates   ??? BREAST SURGERY      left lumpectomy   ??? CESAREAN SECTION     ??? CHOLECYSTECTOMY     ??? COLONOSCOPY     ??? COLONOSCOPY  04/08/10   ??? CORONARY ANGIOPLASTY     ??? DIAGNOSTIC CARDIAC CATH LAB PROCEDURE     ??? DILATATION, ESOPHAGUS     ??? ENDOSCOPY, COLON, DIAGNOSTIC     ??? HYSTERECTOMY, VAGINAL     ??? LAP BAND  05/01/08    Dr. Nicole Cella   ???  OTHER SURGICAL HISTORY      [place] Filter: curently in place   ??? OTHER SURGICAL HISTORY  07/05/13    lap band removal   ??? SHOULDER SURGERY     ??? SKIN CANCER EXCISION  12/29/2017   ??? UPPER GASTROINTESTINAL ENDOSCOPY  05/19/13   ??? UPPER GASTROINTESTINAL ENDOSCOPY  07/21/13    ESOPHAGEAL STENT PLACEMENT   ??? UPPER GASTROINTESTINAL ENDOSCOPY N/A 07/31/2014    Esophagogastroduodenoscopy with esophageal balloon dilation   ??? VASCULAR SURGERY  04/2015    Lucendia Herrlich,  celiac artery angiogram, normal abd arteries       FAMILY HISTORY    Family History   Problem Relation Age of Onset   ??? Arthritis Mother    ??? Cancer Mother    ??? Depression Mother    ??? High Blood Pressure Mother    ??? Heart Disease Mother 1        MI    ??? Ovarian Cancer Mother 34   ??? Cancer Father 65        colon   ??? Heart Disease Maternal Grandmother    ??? Early Death Paternal Grandmother    ??? Heart Disease Paternal Grandfather    ??? Diabetes Other    ??? High Blood Pressure Other    ??? Obesity Other    ??? Other Sister         OSA   ??? Other Brother         OSA   ??? Other Brother         OSA   ??? Other Daughter         OSA       SOCIAL HISTORY    Social History     Tobacco Use   ??? Smoking status: Former Smoker     Packs/day: 0.50     Years: 40.00     Pack years: 20.00     Types: Cigarettes     Last attempt to quit: 08/07/2012     Years since quitting: 7.2   ??? Smokeless tobacco: Former Systems developer     Quit date: 06/16/2013   ??? Tobacco comment: started to smoke at age 74 / only smoked 0.5 p.p.d    Substance Use Topics   ??? Alcohol use: No     Alcohol/week: 0.0 standard drinks   ??? Drug use: No       ALLERGIES    Allergies   Allergen Reactions   ??? Adhesive Tape      Local itching   ??? Seasonal    ??? [place] [Pentazocine] Other (See Comments)     dizzy       MEDICATIONS    Current Outpatient Medications on File Prior to Encounter   Medication Sig Dispense Refill   ??? rosuvastatin (CRESTOR) 10 MG tablet TAKE ONE TABLET BY MOUTH DAILY 27 tablet 0   ??? silver sulfADIAZINE (SILVADENE) 1 % cream Apply topically to the wound on the left thigh daily. 25 g 1   ??? ranolazine (RANEXA) 1000 MG extended release tablet Take 1 tablet by mouth 2 times daily 60 tablet 5   ??? Cholecalciferol (VITAMIN D3) 50 MCG (2000 UT) CAPS Take 1 capsule by mouth daily     ??? gabapentin (NEURONTIN) 300 MG capsule TAKE TWO CAPSULES BY MOUTH ONCE NIGHTLY 60 capsule 5   ??? spironolactone (ALDACTONE) 25 MG tablet TAKE ONE AND ONE-HALF TABLET BY MOUTH DAILY 38 tablet 0   ???  calcium-vitamin D (CALCIUM 500/D) 500-200 MG-UNIT  per tablet Take 1 tablet by mouth daily     ??? ketoconazole (NIZORAL) 2 % cream Apply to the lesions on the upper back twice daily until improved. 30 g 1   ??? omeprazole (PRILOSEC) 40 MG delayed release capsule TAKE ONE CAPSULE BY MOUTH DAILY 90 capsule 1   ??? lisinopril (PRINIVIL;ZESTRIL) 40 MG tablet TAKE ONE TABLET BY MOUTH DAILY 90 tablet 1   ??? montelukast (SINGULAIR) 10 MG tablet TAKE ONE TABLET BY MOUTH DAILY 30 tablet 2   ??? acetic acid-hydrocortisone (VOSOL-HC) 1-2 % otic solution 4 drops in affected ear or ears every 3 hours while awake 15 mL 1   ??? albuterol sulfate HFA (PROVENTIL HFA) 108 (90 Base) MCG/ACT inhaler Inhale 2 puffs into the lungs every 6 hours as needed for Wheezing 3 Inhaler 3   ??? metoprolol (LOPRESSOR) 100 MG tablet Take 1 twice a day 270 tablet 1   ??? lidocaine (LIDODERM) 5 % Place 1 patch onto the skin daily 12 hours on, 12 hours off. 30 patch 0   ??? nitroGLYCERIN (NITROSTAT) 0.4 MG SL tablet up to max of 3 total doses. If no relief after 1 dose, call 911. 25 tablet 3   ??? prasugrel (EFFIENT) 10 MG TABS Take 1 tablet by mouth daily 90 tablet 3   ??? torsemide (DEMADEX) 20 MG tablet Take 1 tablet by mouth daily as needed (swelling) 30 tablet 1   ??? diclofenac sodium 1 % GEL Apply 2 g topically 2 times daily 1 Tube 3   ??? aspirin 81 MG EC tablet Take 1 tablet by mouth daily 30 tablet 11   ??? triamcinolone (KENALOG) 0.1 % cream Apply topically 2 times daily.Apply with Q tip in each ear BID for 1 week. 15 g 1   ??? mupirocin (BACTROBAN) 2 % ointment Apply topically  Daily.Apply with Q tip in each naris at night 15 g 0   ??? Multiple Vitamins-Minerals (MULTIVITAMIN PO) Take 1 tablet by mouth daily      ??? cetirizine (ZYRTEC ALLERGY) 10 MG tablet Take 10 mg by mouth daily.       No current facility-administered medications on file prior to encounter.        REVIEW OF SYSTEMS  Pertinent items are noted in HPI.    Written patient dismissal instructions given to  patient and signed by patient or POA.         Discharge Instructions         [facility]  [name], [place] [address]  Telephone: [phone]     FAX [phone]     Discharge Instructions:  Keep weight off wounds and reposition every 2 hours if applicable.  Avoid standing for long periods of time.     If wound(s) is on your lower extremity, elevate legs to the level of the heart or above for 30 minutes 4-5 times a day and/or when sitting.     Do not get wounds wet in bath or shower unless otherwise instructed by your physician.If your wound is on you foot or leg, you may purchase a cast bag. Please ask at the pharmacy.    When taking antibiotics take entire prescription as ordered by MD do not stop taking until medicine is all gone. Exercise as tolerated. No Smoking. Smoking prohibits wound healing.    If Vascular testing is ordered, please call [facility] [phone]) to schedule.    Vascular tests ordered by Wound Care Physicians may  take up to 2 hours to complete. Please keep that in mind when scheduling.     If Vascular testing is scheduled, please bring supplies to replace your dressing after testing is done. The vascular department does not stock supplies.     Wound:  Left Posterior Thigh    With each dressing change, rinse wounds with 0.9% Saline. (May use wound wash or soft contact solution. Both can be purchased at a local drug store). If unable to obtain saline, may use a gentle soap and water.    Dressing care: Silvadene cream and dry dressing. Change daily    Important dietary reminders:  1. Increase Protein intake for optimal wound healing  2. No added salt to reduce any swelling  3. If diabetic, good glucose control  4. If you smoke, smoking affects wound healing, we ask that you refrain from smoking.    Follow up with Dr Marin Comment In 1 week in the wound care center.     Call [phone] for any questions or concerns.     Your Case Manager is [place] Information: Should you experience any significant changes in your wound(s) or have questions about your wound care, please contact the [facility] at [phone] Monday  - Thursday 8:00 am - 4:00 pm and Friday 8:00 am - 1:00pm. If you need help with your wound outside these hours and cannot wait until we are again available, contact your PCP or go to the hospital emergency room.     PLEASE NOTE: IF YOU ARE UNABLE TO OBTAIN WOUND SUPPLIES, CONTINUE TO USE THE SUPPLIES YOU HAVE AVAILABLE UNTIL YOU ARE ABLE TO REACH [place]. IT IS MOST IMPORTANT TO KEEP THE WOUND COVERED AT ALL TIMES.                Electronically signed by Lorenda Ishihara, MD on 10/31/2019 at 3:45 PM

## 2019-10-31 NOTE — Plan of Care (Signed)
Discharge instructions given.  Patient verbalized understanding.  Return to [facility] in 1 week.  Continue Silvadene Cream

## 2019-10-31 NOTE — Discharge Instructions (Addendum)
[facility]  [place], [place] [address]  Telephone: [phone]     FAX [phone]     Discharge Instructions:  Keep weight off wounds and reposition every 2 hours if applicable.  Avoid standing for long periods of time.     If wound(s) is on your lower extremity, elevate legs to the level of the heart or above for 30 minutes 4-5 times a day and/or when sitting.     Do not get wounds wet in bath or shower unless otherwise instructed by your physician.If your wound is on you foot or leg, you may purchase a cast bag. Please ask at the pharmacy.    When taking antibiotics take entire prescription as ordered by MD do not stop taking until medicine is all gone. Exercise as tolerated. No Smoking. Smoking prohibits wound healing.    If Vascular testing is ordered, please call [facility] [phone]) to schedule.    Vascular tests ordered by Wound Care Physicians may take up to 2 hours to complete. Please keep that in mind when scheduling.     If Vascular testing is scheduled, please bring supplies to replace your dressing after testing is done. The vascular department does not stock supplies.     Wound:  Left Posterior Thigh    With each dressing change, rinse wounds with 0.9% Saline. (May use wound wash or soft contact solution. Both can be purchased at a local drug store). If unable to obtain saline, may use a gentle soap and water.    Dressing care: Silvadene cream and dry dressing. Change daily    Important dietary reminders:  1. Increase Protein intake for optimal wound healing  2. No added salt to reduce any swelling  3. If diabetic, good glucose control  4. If you smoke, smoking affects wound healing, we ask that you refrain from smoking.    Follow up with Dr Marin Comment In 1 week in the wound care center.     Call [phone] for any questions or concerns.     Your Case Manager is [place] Information: Should you experience any  significant changes in your wound(s) or have questions about your wound care, please contact the [place] at [phone] Monday  - Thursday 8:00 am - 4:00 pm and Friday 8:00 am - 1:00pm. If you need help with your wound outside these hours and cannot wait until we are again available, contact your PCP or go to the hospital emergency room.     PLEASE NOTE: IF YOU ARE UNABLE TO OBTAIN WOUND SUPPLIES, CONTINUE TO USE THE SUPPLIES YOU HAVE AVAILABLE UNTIL YOU ARE ABLE TO REACH [place]. IT IS MOST IMPORTANT TO KEEP THE WOUND COVERED AT ALL TIMES.

## 2019-11-01 ENCOUNTER — Ambulatory Visit: Admit: 2019-11-01 | Discharge: 2019-11-01 | Payer: PRIVATE HEALTH INSURANCE | Attending: Family | Primary: Family

## 2019-11-01 DIAGNOSIS — Z0000 Encounter for general adult medical examination without abnormal findings: Secondary | ICD-10-CM

## 2019-11-01 MED ORDER — SHINGRIX 50 MCG/0.5ML IM SUSR
50 MCG/0.5ML | INTRAMUSCULAR | 0 refills | Status: AC
Start: 2019-11-01 — End: 2020-04-29

## 2019-11-01 MED ORDER — MECLIZINE HCL 25 MG PO TABS
25 MG | ORAL_TABLET | Freq: Three times a day (TID) | ORAL | 0 refills | Status: DC | PRN
Start: 2019-11-01 — End: 2020-02-12

## 2019-11-01 MED ORDER — ALPRAZOLAM 0.25 MG PO TABS
0.25 MG | ORAL_TABLET | Freq: Two times a day (BID) | ORAL | 0 refills | Status: DC
Start: 2019-11-01 — End: 2020-05-20

## 2019-11-01 MED ORDER — ONDANSETRON HCL 4 MG PO TABS
4 MG | ORAL_TABLET | Freq: Three times a day (TID) | ORAL | 0 refills | Status: DC | PRN
Start: 2019-11-01 — End: 2020-12-11

## 2019-11-01 NOTE — Progress Notes (Signed)
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 voice recognition computer software.  Although all attempts are made to  edit the dictation for accuracy, there may be errors in the transcription that are not intended.

## 2019-11-06 ENCOUNTER — Encounter

## 2019-11-06 MED ORDER — SPIRONOLACTONE 25 MG PO TABS
25 MG | ORAL_TABLET | ORAL | 0 refills | Status: DC
Start: 2019-11-06 — End: 2019-12-26

## 2019-11-07 ENCOUNTER — Inpatient Hospital Stay
Admit: 2019-11-07 | Discharge: 2019-11-07 | Payer: PRIVATE HEALTH INSURANCE | Attending: Emergency Medicine | Primary: Family

## 2019-11-07 DIAGNOSIS — T8189XA Other complications of procedures, not elsewhere classified, initial encounter: Secondary | ICD-10-CM

## 2019-11-07 MED ORDER — LIDOCAINE 4 % EX CREA
4 % | Freq: Once | CUTANEOUS | Status: DC
Start: 2019-11-07 — End: 2019-11-08

## 2019-11-07 NOTE — Plan of Care (Signed)
Discharge instructions given.  Patient verbalized understanding.  Return to [facility] in 1 week.  Continue Silvadene
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
## 2019-11-10 NOTE — Discharge Instructions (Addendum)
[facility]  [place], [place] [address]  Telephone: [phone]     FAX [phone]     Discharge Instructions:  Keep weight off wounds and reposition every 2 hours if applicable.  Avoid standing for long periods of time.     If wound(s) is on your lower extremity, elevate legs to the level of the heart or above for 30 minutes 4-5 times a day and/or when sitting.     Do not get wounds wet in bath or shower unless otherwise instructed by your physician.If your wound is on you foot or leg, you may purchase a cast bag. Please ask at the pharmacy.    When taking antibiotics take entire prescription as ordered by MD do not stop taking until medicine is all gone. Exercise as tolerated. No Smoking. Smoking prohibits wound healing.    If Vascular testing is ordered, please call [phone]) to schedule.    Vascular tests ordered by Wound Care Physicians may take up to 2 hours to complete. Please keep that in mind when scheduling.     If Vascular testing is scheduled, please bring supplies to replace your dressing after testing is done. The vascular department does not stock supplies.     Wound:  Left Posterior Thigh    With each dressing change, rinse wounds with 0.9% Saline. (May use wound wash or soft contact solution. Both can be purchased at a local drug store). If unable to obtain saline, may use a gentle soap and water.    Dressing care: Silvadene cream and dry dressing. Change daily    Important dietary reminders:  1. Increase Protein intake for optimal wound healing  2. No added salt to reduce any swelling  3. If diabetic, good glucose control  4. If you smoke, smoking affects wound healing, we ask that you refrain from smoking.    Follow up with Dr Marin Comment In 1 week in the wound care center.     Call [phone] for any questions or concerns.     Your Case Manager is [place] Information: Should you experience any  significant changes in your wound(s) or have questions about your wound care, please contact the [facility] at [phone] Monday  - Thursday 8:00 am - 4:00 pm and Friday 8:00 am - 1:00pm. If you need help with your wound outside these hours and cannot wait until we are again available, contact your PCP or go to the hospital emergency room.     PLEASE NOTE: IF YOU ARE UNABLE TO OBTAIN WOUND SUPPLIES, CONTINUE TO USE THE SUPPLIES YOU HAVE AVAILABLE UNTIL YOU ARE ABLE TO REACH [place]. IT IS MOST IMPORTANT TO KEEP THE WOUND COVERED AT ALL TIMES.

## 2019-11-13 MED ORDER — ROSUVASTATIN CALCIUM 10 MG PO TABS
10 MG | ORAL_TABLET | ORAL | 3 refills | Status: DC
Start: 2019-11-13 — End: 2021-06-19

## 2019-11-13 NOTE — Telephone Encounter (Signed)
RX APPROVAL:      Refill:   Requested Prescriptions     Pending Prescriptions Disp Refills   ??? rosuvastatin (CRESTOR) 10 MG tablet [Pharmacy Med Name: ROSUVASTATIN CALCIUM 10 MG TAB] 27 tablet 0     Sig: TAKE ONE TABLET BY MOUTH DAILY      Last OV: 10/06/2019   Last EKG:    Last Labs:  Lab Results   Component Value Date    CHOL 150 08/09/2019    TRIG 134 08/09/2019    HDL 43 08/09/2019    HDL 52 03/31/2011    LDLCALC 80 08/09/2019    LABVLDL 27 08/09/2019     Lab Results   Component Value Date    ALT 34 10/16/2019    AST 37 10/16/2019       Plan and labs reviewed

## 2019-11-14 ENCOUNTER — Inpatient Hospital Stay
Admit: 2019-11-14 | Discharge: 2019-11-14 | Payer: PRIVATE HEALTH INSURANCE | Attending: Emergency Medicine | Primary: Family

## 2019-11-14 DIAGNOSIS — T8189XA Other complications of procedures, not elsewhere classified, initial encounter: Secondary | ICD-10-CM

## 2019-11-14 MED ORDER — LIDOCAINE 4 % EX CREA
4 % | Freq: Once | CUTANEOUS | Status: DC
Start: 2019-11-14 — End: 2019-11-15

## 2019-11-14 NOTE — Progress Notes (Signed)
[place]   Progress Note       [facility] RECORD NUMBER:  [id number]  AGE: 63 y.o.   GENDER: female  DOB: 15-Sep-1956  EPISODE DATE:  11/14/2019    Subjective:     Chief Complaint   Patient presents with   ??? Wound Check     Left posterior thigh      Follow-up visit for left thigh wound    Patient without any new wound care issues. Denies any constitutional symptoms. Has been tolerating wound care dressings without problems. Patient feels that wound is improving    Assessment:     63 year old female who sustained a burn to the left posterior thigh from a heating Rice sachet approximately 6 weeks prior to presentation here.  The patient then developed an abscess approximately 4 to 5 weeks prior to presentation here and was followed by several different providers including dermatology, primary care.  Wound and abscess became worse requiring hospitalization 10/16/2019-10/23/2019.  The patient is status post incision and drainage by general surgery on 10/17/2019, done at the bedside. Patient has remaining wound to left posterior thigh.  ??  Nonhealing surgical wound to left posterior thigh.  Severity of fat layers exposed  Wound present since 10/16/2019    Patient Active Problem List   Diagnosis Code   ??? Back pain M54.9   ??? Abdominal  pain, other specified site R10.9   ??? Morbid obesity with body mass index (BMI) of 45.0 to 49.9 in adult (HCC) E66.01, Z68.42   ??? 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 Tinnitus, subjective H93.19   ??? Essential hypertension I10   ??? 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 Adrenal mass, left (HCC) E27.8   ??? Adrenal nodule (HCC) E27.8   ??? Obstructive sleep apnea G47.33   ??? Abnormal stress test R94.39   ??? Chronic diastolic congestive heart failure (HCC) I50.32   ??? Coronary artery disease due to lipid rich plaque I25.10, I25.83   ??? Pulmonary hypertension I27.20   ??? Status post insertion of drug-eluting stent into left anterior descending (LAD) artery  Z95.5   ??? Anxiety and depression F41.9, F32.9   ??? Bradycardia R00.1   ??? Dyslipidemia E78.5   ??? Chronic eczematous otitis externa of both ears H60.8X3   ??? Mucositis K12.30   ??? Sprain of radiocarpal joint of right wrist S63.521A   ??? Idiopathic peripheral neuropathy G60.9   ??? Chronic pain syndrome G89.4   ??? Unstable angina (HCC) I20.0   ??? 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 Displacement of thoracic intervertebral disc without myelopathy M51.24   ??? 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 Disc displacement, thoracic M51.24   ??? Bilateral leg numbness R20.0   ??? Chest pain with high risk for cardiac etiology R07.9   ??? Cellulitis of left leg L03.116   ??? Abscess of left leg L02.416   ??? Nonhealing surgical wound, initial encounter T81.89XA   ??? 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  Measuring smaller.  Increased epithelization along the edges.    Plan:     1. Wound care: Discontinue Silvadene as patient no longer has any nonviable tissue.  Will do Hydrofera Blue.  Please see attached Discharge Instructions for specific wound treatment plan  2. Offloading and edema  control: Continue avoiding pressure and trauma to the wound.  3. Infection: Completed IV antibiotic course.  No signs of acute infection.  Continue to monitor  4. Circulation: Pressure-relief to help local skin perfusion  5. Nutrition: Encourage protein emphasis with meals.  Patient is a nondiabetic      Wound 10/17/19 Thigh Left;Posterior #1 (Active)   Wound Image   10/31/19 1414   Wound Etiology Non-Healing Surgical 11/14/19 1430   Dressing Status Clean;Dry;Intact 10/18/19 0915   Wound Cleansed Cleansed with saline 11/14/19 1430   Dressing/Treatment Moist to dry 10/18/19 0915   Wound Length (cm) 1.5 cm 11/14/19 1430   Wound Width (cm) 2 cm 11/14/19 1430   Wound Depth (cm) 0.1 cm 11/14/19 1430   Wound Surface Area (cm^2)  3 cm^2 11/14/19 1430   Change in Wound Size % (l*w) 45.45 11/14/19 1430   Wound Volume (cm^3) 0.3 cm^3 11/14/19 1430   Wound Healing % 73 11/14/19 1430   Post-Procedure Length (cm) 2.1 cm 11/07/19 1427   Post-Procedure Width (cm) 2.5 cm 11/07/19 1427   Post-Procedure Depth (cm) 0.15 cm 11/07/19 1427   Post-Procedure Surface Area (cm^2) 5.25 cm^2 11/07/19 1427   Post-Procedure Volume (cm^3) 0.79 cm^3 11/07/19 1427   Wound Assessment Granulation tissue 11/14/19 1430   Drainage Amount Moderate 11/14/19 1430   Drainage Description Serosanguinous 11/14/19 1430   Odor None 11/14/19 1430   Peri-wound Assessment Hyperpigmented 11/14/19 1430   Margins Attached edges 11/14/19 1430   Wound Thickness Description not for Pressure Injury Full thickness 11/14/19 1430   Number of days: 28        HISTORY of PRESENT ILLNESS HPI     Rachael Mays is a 63 y.o. female who presents today for wound/ulcer evaluation.     Initial History of Wound Context: 63 year old female who presents for an evaluation of a nonhealing wound to the posterior left thigh.  The patient initially sustained a burn from a heat rice sachet.  This evolved into an abscess progressively getting worse over the course of 6 weeks.  The patient required admission early November 2020.  She had bedside incision and drainage by general surgery.  She was on IV antibiotics and then was sent home with p.o. Zyvox.  Denies any constitutional complaints.  No fever, chills.  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  ??  Wound/Ulcer Pain Timing/Severity: none  Quality of pain: N/A  Severity:  0 / 10   Modifying Factors: None  Associated Signs/Symptoms: none  ??  Ulcer Identification:  Ulcer Type: non-healing surgical  Contributing Factors: edema, decreased mobility and obesity    Acute Wound: N/A not an acute wound    Objective:    BP 129/63    Pulse 55    Temp 97.1 ??F (36.2 ??C) (Infrared)    Resp 16   Wt Readings from Last 3 Encounters:    11/01/19 (!) 312 lb 6.4 oz (141.7 kg)   10/31/19 (!) 312 lb 6.4 oz (141.7 kg)   10/17/19 (!) 315 lb 12.8 oz (143.2 kg)       PHYSICAL EXAM  General Appearance: alert and oriented to person, place and time, well-developed and well nourished and in no acute distress  Head: normocephalic and atraumatic  Eyes: pupils equal, round, and reactive to light, extraocular eye movements intact, conjunctivae normal  ENT: oropharynx clear and moist with normal mucous membranes  Pulmonary/Chest: clear to auscultation bilaterally- no wheezes, rales or  rhonchi, normal air movement, no respiratory distress  Cardiovascular: normal rate and normal S1 and S2  Abdomen: soft, non-tender and non-distended  Extremities: no cyanosis, no clubbing and no edema    PAST MEDICAL HISTORY        Diagnosis Date   ??? Anxiety    ??? Asthma    ??? Blood circulation, collateral    ??? CAD (coronary artery disease)    ??? CHF (congestive heart failure) ([place])    ??? Chronic back pain    ??? Deep vein thrombosis    ??? 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 Depression    ??? GERD (gastroesophageal reflux disease)     NO LONGER SINCE LAP   ??? GERD (gastroesophageal reflux disease) 01/23/2009   ??? Hx of blood clots    ??? Hyperlipidemia     hx; resolved with lap band   ??? Hypertension    ??? Idiopathic peripheral neuropathy 03/26/2017   ??? Melanoma ([place])     upper back   ??? MRSA (methicillin resistant staph aureus) culture positive 10/17/2019    leg abscess   ??? Obesity     hx of; had lap band   ??? Obstructive sleep apnea 09/18/2015     Updating Deprecated Diagnoses   ??? Obstructive sleep apnea (adult) (pediatric) 09/18/2015   ??? Pulmonary embolism ([place])        PAST SURGICAL HISTORY    Past Surgical History:   Procedure Laterality Date   ??? BACK SURGERY      neck  plates   ??? BREAST SURGERY      left lumpectomy   ??? CESAREAN SECTION     ??? CHOLECYSTECTOMY     ??? COLONOSCOPY     ??? COLONOSCOPY  04/08/10   ??? CORONARY ANGIOPLASTY     ??? DIAGNOSTIC CARDIAC CATH LAB PROCEDURE     ???  DILATATION, ESOPHAGUS     ??? ENDOSCOPY, COLON, DIAGNOSTIC     ??? HYSTERECTOMY, VAGINAL     ??? LAP BAND  05/01/08    Dr. Nicole Cella   ??? OTHER SURGICAL HISTORY      [place] Filter: curently in place   ??? OTHER SURGICAL HISTORY  07/05/13    lap band removal   ??? SHOULDER SURGERY     ??? SKIN CANCER EXCISION  12/29/2017   ??? UPPER GASTROINTESTINAL ENDOSCOPY  05/19/13   ??? UPPER GASTROINTESTINAL ENDOSCOPY  07/21/13    ESOPHAGEAL STENT PLACEMENT   ??? UPPER GASTROINTESTINAL ENDOSCOPY N/A 07/31/2014    Esophagogastroduodenoscopy with esophageal balloon dilation   ??? VASCULAR SURGERY  04/2015    Lucendia Herrlich, celiac artery angiogram, normal abd arteries       FAMILY HISTORY    Family History   Problem Relation Age of Onset   ??? Arthritis Mother    ??? Cancer Mother    ??? Depression Mother    ??? High Blood Pressure Mother    ??? Heart Disease Mother 28        MI    ??? Ovarian Cancer Mother 1   ??? Cancer Father 63        colon   ??? Heart Disease Maternal Grandmother    ??? Early Death Paternal Grandmother    ??? Heart Disease Paternal Grandfather    ??? Diabetes Other    ??? High Blood Pressure Other    ??? Obesity Other    ??? Other Sister         OSA   ???  Other Brother         OSA   ??? Other Brother         OSA   ??? Other Daughter         OSA       SOCIAL HISTORY    Social History     Tobacco Use   ??? Smoking status: Former Smoker     Packs/day: 0.50     Years: 40.00     Pack years: 20.00     Types: Cigarettes     Last attempt to quit: 08/07/2012     Years since quitting: 7.2   ??? Smokeless tobacco: Former Systems developer     Quit date: 06/16/2013   ??? Tobacco comment: started to smoke at age 7 / only smoked 0.5 p.p.d    Substance Use Topics   ??? Alcohol use: No     Alcohol/week: 0.0 standard drinks   ??? Drug use: No       ALLERGIES    Allergies   Allergen Reactions   ??? Seasonal    ??? Talwin [Pentazocine] Other (See Comments)     dizzy       MEDICATIONS    Current Outpatient Medications on File Prior to Encounter   Medication Sig Dispense Refill   ??? rosuvastatin (CRESTOR) 10 MG tablet  TAKE ONE TABLET BY MOUTH DAILY 90 tablet 3   ??? spironolactone (ALDACTONE) 25 MG tablet TAKE ONE AND ONE-HALF TABLET BY MOUTH DAILY 90 tablet 0   ??? zoster recombinant adjuvanted vaccine (SHINGRIX) 50 MCG/0.5ML SUSR injection Inject 0.5 mLs into the muscle See Admin Instructions 1 dose now and repeat in 2-6 months 0.5 mL 0   ??? ALPRAZolam (XANAX) 0.25 MG tablet Take 1 tablet by mouth 2 times daily for 30 days. 60 tablet 0   ??? ondansetron (ZOFRAN) 4 MG tablet Take 1 tablet by mouth 3 times daily as needed for Nausea or Vomiting 30 tablet 0   ??? silver sulfADIAZINE (SILVADENE) 1 % cream Apply topically to the wound on the left thigh daily. 25 g 1   ??? ranolazine (RANEXA) 1000 MG extended release tablet Take 1 tablet by mouth 2 times daily 60 tablet 5   ??? Cholecalciferol (VITAMIN D3) 50 MCG (2000 UT) CAPS Take 1 capsule by mouth daily     ??? gabapentin (NEURONTIN) 300 MG capsule TAKE TWO CAPSULES BY MOUTH ONCE NIGHTLY 60 capsule 5   ??? calcium-vitamin D (CALCIUM 500/D) 500-200 MG-UNIT per tablet Take 1 tablet by mouth daily     ??? omeprazole (PRILOSEC) 40 MG delayed release capsule TAKE ONE CAPSULE BY MOUTH DAILY 90 capsule 1   ??? lisinopril (PRINIVIL;ZESTRIL) 40 MG tablet TAKE ONE TABLET BY MOUTH DAILY 90 tablet 1   ??? montelukast (SINGULAIR) 10 MG tablet TAKE ONE TABLET BY MOUTH DAILY 30 tablet 2   ??? acetic acid-hydrocortisone (VOSOL-HC) 1-2 % otic solution 4 drops in affected ear or ears every 3 hours while awake 15 mL 1   ??? albuterol sulfate HFA (PROVENTIL HFA) 108 (90 Base) MCG/ACT inhaler Inhale 2 puffs into the lungs every 6 hours as needed for Wheezing 3 Inhaler 3   ??? metoprolol (LOPRESSOR) 100 MG tablet Take 1 twice a day 270 tablet 1   ??? lidocaine (LIDODERM) 5 % Place 1 patch onto the skin daily 12 hours on, 12 hours off. 30 patch 0   ??? nitroGLYCERIN (NITROSTAT) 0.4 MG SL tablet up to max of 3 total doses. If no relief  after 1 dose, call 911. 25 tablet 3   ??? prasugrel (EFFIENT) 10 MG TABS Take 1 tablet by mouth daily  90 tablet 3   ??? torsemide (DEMADEX) 20 MG tablet Take 1 tablet by mouth daily as needed (swelling) 30 tablet 1   ??? aspirin 81 MG EC tablet Take 1 tablet by mouth daily 30 tablet 11   ??? Multiple Vitamins-Minerals (MULTIVITAMIN PO) Take 1 tablet by mouth daily        No current facility-administered medications on file prior to encounter.        REVIEW OF SYSTEMS  A comprehensive review of systems was negative.    Written patient dismissal instructions given to patient and signed by patient or POA.         Discharge Instructions       [facility]  [place], [place] [address]  Telephone: [phone]     FAX [phone]     Discharge Instructions:  Keep weight off wounds and reposition every 2 hours if applicable.  Avoid standing for long periods of time.     If wound(s) is on your lower extremity, elevate legs to the level of the heart or above for 30 minutes 4-5 times a day and/or when sitting.     Do not get wounds wet in bath or shower unless otherwise instructed by your physician.If your wound is on you foot or leg, you may purchase a cast bag. Please ask at the pharmacy.    When taking antibiotics take entire prescription as ordered by MD do not stop taking until medicine is all gone. Exercise as tolerated. No Smoking. Smoking prohibits wound healing.    If Vascular testing is ordered, please call [phone]) to schedule.    Vascular tests ordered by Wound Care Physicians may take up to 2 hours to complete. Please keep that in mind when scheduling.     If Vascular testing is scheduled, please bring supplies to replace your dressing after testing is done. The vascular department does not stock supplies.     Wound:  Left Posterior Thigh    With each dressing change, rinse wounds with 0.9% Saline. (May use wound wash or soft contact solution. Both can be purchased at a local drug store). If unable to obtain saline, may use a gentle soap and water.    Dressing care:  Hydrofera Blue Ready and dry dressing. Change daily    Important dietary reminders:  1. Increase Protein intake for optimal wound healing  2. No added salt to reduce any swelling  3. If diabetic, good glucose control  4. If you smoke, smoking affects wound healing, we ask that you refrain from smoking.    Follow up with Dr Marin Comment In 1 week in the wound care center.     Call [phone] for any questions or concerns.     Your Case Manager is [place]: [place] Information: Should you experience any significant changes in your wound(s) or have questions about your wound care, please contact the [facility] at [phone] Monday  - Thursday 8:00 am - 4:00 pm and Friday 8:00 am - 1:00pm. If you need help with your wound outside these hours and cannot wait until we are again available, contact your PCP or go to the hospital emergency room.     PLEASE NOTE: IF YOU ARE  UNABLE TO OBTAIN WOUND SUPPLIES, CONTINUE TO USE THE SUPPLIES YOU HAVE AVAILABLE UNTIL YOU ARE ABLE TO REACH [place]. IT IS MOST IMPORTANT TO KEEP THE WOUND COVERED AT ALL TIMES.          Electronically signed by Lorenda Ishihara, MD on 11/14/2019 at 2:41 PM

## 2019-11-14 NOTE — Plan of Care (Signed)
Discharge instructions given.  Patient verbalized understanding.  Return to [facility] in 1 week.  Called/faxed orders to  [facility]

## 2019-11-14 NOTE — Progress Notes (Signed)
[place]:      [facility] [address] [place], [place] p: [phone] f: [phone]    Ordering Center:     [place]  [place] [address]  [phone]  Dept: [phone]   Fax# [phone]    Patient Information:      RAYMIE GORDEN  [place] [id number]   [phone]   DOB: [birth year]  AGE: 63 y.o.     GENDER: female   TODAYS DATE:  11/14/2019    Insurance:      PRIMARY INSURANCE:  Plan: [facility] MEDICAID  Coverage: CARESOURCE  Effective Date: 03/08/2019  Group Number: @[id number]  Subscriber Number: [id number] - (Medicaid Managed)    Payor/Plan Subscr DOB Sex Relation Sub. Ins. ID Effective Group Num   1. CARESOURCE - * [place],Genese S 21-Jul-1956 Female Self [id number] 03/08/19 [place]OHIO                                   [address]         Patient Wound Information:     Additional ICD-10 Codes:L97.122    Patient Active Problem List   Diagnosis Code   ??? Back pain M54.9   ??? Abdominal  pain, other specified site R10.9   ??? Morbid obesity with body mass index (BMI) of 45.0 to 49.9 in adult (HCC) E66.01, Z68.42   ??? 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 Tinnitus, subjective H93.19   ??? Essential hypertension I10   ??? 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 Adrenal mass, left (HCC) E27.8   ??? Adrenal nodule (HCC) E27.8   ??? Obstructive sleep apnea G47.33   ??? Abnormal stress test R94.39   ??? Chronic diastolic congestive heart failure (HCC) I50.32   ??? Coronary artery disease due to lipid rich plaque I25.10, I25.83   ??? Pulmonary hypertension I27.20   ??? Status post insertion of drug-eluting stent into left anterior descending (LAD) artery Z95.5   ??? Anxiety and depression F41.9, F32.9   ??? Bradycardia R00.1   ??? Dyslipidemia E78.5   ??? Chronic eczematous otitis externa of both ears H60.8X3   ??? Mucositis K12.30   ??? Sprain of radiocarpal joint of right wrist S63.521A   ??? Idiopathic peripheral neuropathy G60.9   ??? Chronic pain  syndrome G89.4   ??? Unstable angina (HCC) I20.0   ??? 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 Displacement of thoracic intervertebral disc without myelopathy M51.24   ??? 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 Disc displacement, thoracic M51.24   ??? Bilateral leg numbness R20.0   ??? Chest pain with high risk for cardiac etiology R07.9   ??? Cellulitis of left leg L03.116   ??? Abscess of left leg L02.416   ??? Nonhealing surgical wound, initial encounter T81.89XA   ??? Non-pressure chronic ulcer of left thigh with fat layer exposed ([place]) L97.122       WOUNDS REQUIRING DRESSING SUPPLIES:     Wound 10/17/19 Thigh Left;Posterior #1 (Active)   Wound Image   10/31/19 1414   Wound Etiology Non-Healing Surgical 11/14/19 1430   Dressing Status Clean;Dry;Intact 10/18/19 0915   Wound Cleansed Cleansed with saline 11/14/19 1430   Dressing/Treatment Moist to dry 10/18/19 0915   Wound Length (cm) 1.5 cm 11/14/19 1430   Wound  Width (cm) 2 cm 11/14/19 1430   Wound Depth (cm) 0.1 cm 11/14/19 1430   Wound Surface Area (cm^2) 3 cm^2 11/14/19 1430   Change in Wound Size % (l*w) 45.45 11/14/19 1430   Wound Volume (cm^3) 0.3 cm^3 11/14/19 1430   Wound Healing % 73 11/14/19 1430   Post-Procedure Length (cm) 2.1 cm 11/07/19 1427   Post-Procedure Width (cm) 2.5 cm 11/07/19 1427   Post-Procedure Depth (cm) 0.15 cm 11/07/19 1427   Post-Procedure Surface Area (cm^2) 5.25 cm^2 11/07/19 1427   Post-Procedure Volume (cm^3) 0.79 cm^3 11/07/19 1427   Wound Assessment Granulation tissue 11/14/19 1430   Drainage Amount Moderate 11/14/19 1430   Drainage Description Serosanguinous 11/14/19 1430   Odor None 11/14/19 1430   Peri-wound Assessment Hyperpigmented 11/14/19 1430   Margins Attached edges 11/14/19 1430   Wound Thickness Description not for Pressure Injury Full thickness 11/14/19 1430   Number of days: 28          Supplies Requested :      WOUND #: 1   PRIMARY DRESSING:    Hydrofera Blue ready   Cover and Secure with: None      FREQUENCY OF DRESSING CHANGES:  Every other day    Wound Thickness [x]  Full   [] Partial                                     Patient Wound(s) Debrided: [x]  Yes   []  No    Debridement Date: 11/14/2019    Debribement Type: Excisional/Sharp    ADDITIONAL ITEMS:  []  Gloves Small  [x]  Gloves Medium []  Gloves Large []  Gloves XLarge  []  Paper Tape 1" []  Paper Tape 2" []  Paper Tape 3"  []  Medipore Tape 3"  []  Saline  []  Skin Prep   []  Adhesive Remover   []  Cotton Tip Applicators  []  Tubular Stocking   []  Size E  []  Size G  []  Other:    Patient currently being seen by Home Health: []  Yes   [x]  No    Duration for needed supplies:  [x] 15  [] 30  [] 60  [] 90 Days    Provider Information:      PROVIDER'S NAME/NPI  Dr Lorenda Ishihara [id number]    I give permission to coordinate the care for this patient

## 2019-11-15 NOTE — Discharge Instructions (Addendum)
[facility]  [place], [place] [address]  Telephone: [phone]     FAX [phone]     Discharge Instructions:  Keep weight off wounds and reposition every 2 hours if applicable.  Avoid standing for long periods of time.     If wound(s) is on your lower extremity, elevate legs to the level of the heart or above for 30 minutes 4-5 times a day and/or when sitting.     Do not get wounds wet in bath or shower unless otherwise instructed by your physician.If your wound is on you foot or leg, you may purchase a cast bag. Please ask at the pharmacy.    When taking antibiotics take entire prescription as ordered by MD do not stop taking until medicine is all gone. Exercise as tolerated. No Smoking. Smoking prohibits wound healing.    If Vascular testing is ordered, please call [phone] [phone]) to schedule.    Vascular tests ordered by Wound Care Physicians may take up to 2 hours to complete. Please keep that in mind when scheduling.     If Vascular testing is scheduled, please bring supplies to replace your dressing after testing is done. The vascular department does not stock supplies.     Wound:  Left Posterior Thigh    With each dressing change, rinse wounds with 0.9% Saline. (May use wound wash or soft contact solution. Both can be purchased at a local drug store). If unable to obtain saline, may use a gentle soap and water.    Dressing care: Hydrofera Blue Ready and dry dressing. Change every other day.    Important dietary reminders:  1. Increase Protein intake for optimal wound healing  2. No added salt to reduce any swelling  3. If diabetic, good glucose control  4. If you smoke, smoking affects wound healing, we ask that you refrain from smoking.    Follow up with Dr Marin Comment In 1 week in the wound care center.     Call [phone] for any questions or concerns.     Your Case Manager is [place]: [place] Information: Should you  experience any significant changes in your wound(s) or have questions about your wound care, please contact the [place] at [phone] Monday  - Thursday 8:00 am - 4:00 pm and Friday 8:00 am - 1:00pm. If you need help with your wound outside these hours and cannot wait until we are again available, contact your PCP or go to the hospital emergency room.     PLEASE NOTE: IF YOU ARE UNABLE TO OBTAIN WOUND SUPPLIES, CONTINUE TO USE THE SUPPLIES YOU HAVE AVAILABLE UNTIL YOU ARE ABLE TO REACH [place]. IT IS MOST IMPORTANT TO KEEP THE WOUND COVERED AT ALL TIMES.

## 2019-11-21 ENCOUNTER — Inpatient Hospital Stay
Admit: 2019-11-21 | Discharge: 2019-11-21 | Payer: PRIVATE HEALTH INSURANCE | Attending: Emergency Medicine | Primary: Family

## 2019-11-21 DIAGNOSIS — L97122 Non-pressure chronic ulcer of left thigh with fat layer exposed: Secondary | ICD-10-CM

## 2019-11-21 MED ORDER — LIDOCAINE 4 % EX CREA
4 % | Freq: Once | CUTANEOUS | Status: DC
Start: 2019-11-21 — End: 2019-11-22

## 2019-11-21 NOTE — Discharge Instructions (Addendum)
[facility]  [place], [place] [address]  Telephone: [phone]     FAX [phone]     Discharge Instructions:  Keep weight off wounds and reposition every 2 hours if applicable.  Avoid standing for long periods of time.     If wound(s) is on your lower extremity, elevate legs to the level of the heart or above for 30 minutes 4-5 times a day and/or when sitting.     Do not get wounds wet in bath or shower unless otherwise instructed by your physician.If your wound is on you foot or leg, you may purchase a cast bag. Please ask at the pharmacy.    When taking antibiotics take entire prescription as ordered by MD do not stop taking until medicine is all gone. Exercise as tolerated. No Smoking. Smoking prohibits wound healing.    If Vascular testing is ordered, please call [facility] [phone]) to schedule.    Vascular tests ordered by Wound Care Physicians may take up to 2 hours to complete. Please keep that in mind when scheduling.     If Vascular testing is scheduled, please bring supplies to replace your dressing after testing is done. The vascular department does not stock supplies.     Wound:  Left Posterior Thigh    With each dressing change, rinse wounds with 0.9% Saline. (May use wound wash or soft contact solution. Both can be purchased at a local drug store). If unable to obtain saline, may use a gentle soap and water.    Dressing care: Hydrofera Blue Ready and dry dressing. Change every other day.    Important dietary reminders:  1. Increase Protein intake for optimal wound healing  2. No added salt to reduce any swelling  3. If diabetic, good glucose control  4. If you smoke, smoking affects wound healing, we ask that you refrain from smoking.    Follow up with Dr Marin Comment In 1 week in the wound care center.     Call [phone] for any questions or concerns.     Your Case Manager is [place]: [place] Information: Should you  experience any significant changes in your wound(s) or have questions about your wound care, please contact the [place] at [phone] Monday  - Thursday 8:00 am - 4:00 pm and Friday 8:00 am - 1:00pm. If you need help with your wound outside these hours and cannot wait until we are again available, contact your PCP or go to the hospital emergency room.     PLEASE NOTE: IF YOU ARE UNABLE TO OBTAIN WOUND SUPPLIES, CONTINUE TO USE THE SUPPLIES YOU HAVE AVAILABLE UNTIL YOU ARE ABLE TO REACH [place]. IT IS MOST IMPORTANT TO KEEP THE WOUND COVERED AT ALL TIMES.

## 2019-11-27 NOTE — Discharge Instructions (Addendum)
[facility]  [place], [place] [address]  Telephone: [phone]     FAX [phone]     Discharge Instructions:  Keep weight off wounds and reposition every 2 hours if applicable.  Avoid standing for long periods of time.     If wound(s) is on your lower extremity, elevate legs to the level of the heart or above for 30 minutes 4-5 times a day and/or when sitting.     Do not get wounds wet in bath or shower unless otherwise instructed by your physician.If your wound is on you foot or leg, you may purchase a cast bag. Please ask at the pharmacy.    When taking antibiotics take entire prescription as ordered by MD do not stop taking until medicine is all gone. Exercise as tolerated. No Smoking. Smoking prohibits wound healing.    If Vascular testing is ordered, please call [facility] [phone]) to schedule.    Vascular tests ordered by Wound Care Physicians may take up to 2 hours to complete. Please keep that in mind when scheduling.     If Vascular testing is scheduled, please bring supplies to replace your dressing after testing is done. The vascular department does not stock supplies.     Wound:  Left Posterior Thigh    With each dressing change, rinse wounds with 0.9% Saline. (May use wound wash or soft contact solution. Both can be purchased at a local drug store). If unable to obtain saline, may use a gentle soap and water.    Dressing care: Hydrofera Blue Ready and dry dressing. Change every other day.    Important dietary reminders:  1. Increase Protein intake for optimal wound healing  2. No added salt to reduce any swelling  3. If diabetic, good glucose control  4. If you smoke, smoking affects wound healing, we ask that you refrain from smoking.    Follow up with Dr Marin Comment In 1 week in the wound care center.     Call [phone] for any questions or concerns.     Your Case Manager is [place]: [place] Information: Should you  experience any significant changes in your wound(s) or have questions about your wound care, please contact the [place] at [phone] Monday  - Thursday 8:00 am - 4:00 pm and Friday 8:00 am - 1:00pm. If you need help with your wound outside these hours and cannot wait until we are again available, contact your PCP or go to the hospital emergency room.     PLEASE NOTE: IF YOU ARE UNABLE TO OBTAIN WOUND SUPPLIES, CONTINUE TO USE THE SUPPLIES YOU HAVE AVAILABLE UNTIL YOU ARE ABLE TO REACH [place]. IT IS MOST IMPORTANT TO KEEP THE WOUND COVERED AT ALL TIMES.

## 2019-11-29 ENCOUNTER — Inpatient Hospital Stay: Payer: PRIVATE HEALTH INSURANCE | Attending: Emergency Medicine | Primary: Family

## 2019-12-11 NOTE — Telephone Encounter (Signed)
-----   Message from [place] sent at 12/07/2019  1:41 PM EST -----  Subject: Message to Provider    QUESTIONS  Information for Provider? The patient called in regarding a weight loss   supplement injection. The patients wants to receive a call back. please   advise   ---------------------------------------------------------------------------  --------------  CALL BACK INFO  What is the best way for the office to contact you? OK to leave message on   voicemail  Preferred Call Back Phone Number? [phone]  ---------------------------------------------------------------------------  --------------  SCRIPT ANSWERS  Relationship to Patient? Self

## 2019-12-12 NOTE — Telephone Encounter (Signed)
The only information that I saw when looking up weight loss injections was for medi-weight loss which appears to be a combination of vitamin B and B12 with other vitamins.  We do not prescribe or administer that here at office.  Would advise possible referral to nutrition counseling.

## 2019-12-12 NOTE — Telephone Encounter (Signed)
This is for you

## 2019-12-12 NOTE — Telephone Encounter (Signed)
I called the pt and informed her of SW note. Pt would like a referral for Nutrition Counseling.     Pt also was asking about a referral for pain management. She states the doctor that she was seeing does not accepted her insurance anymore. She wanted to know if SW had somewhere she could go?   I spoke with SW and she stated to have the pt call her insurance and see what pain management providers are in her network.       Pt also needs a med refill on her montelukast (SINGULAIR);   From what I could see we are not the original doctor that fills this for her. It looks like Dr. Stephan Minister is who fill last for the pt. Plus this was discontinued back on 08/21/19 by CNP, Chilton Si.     Please advise about med refill.

## 2019-12-13 ENCOUNTER — Encounter

## 2019-12-13 MED ORDER — MONTELUKAST SODIUM 10 MG PO TABS
10 MG | ORAL_TABLET | ORAL | 2 refills | Status: DC
Start: 2019-12-13 — End: 2020-03-18

## 2019-12-13 NOTE — Telephone Encounter (Signed)
Please advise patient to reach out to insurance and see which pain management specialists are in network and if they are taking new patients, I will place referral to who is in her network.  Singulair refilled.

## 2019-12-13 NOTE — Telephone Encounter (Signed)
This just needs to be sent to the pharmacy now

## 2019-12-13 NOTE — Telephone Encounter (Signed)
I called and LVM informing the pt of SW note, I left the ph# to [facility] for her to call and make an appt. I left on the VM that the Singulair was refilled and sent to pt pharmacy. I also left that she needs to call her insurance to see which pain management provider is in her network, before we can place a referral.

## 2019-12-13 NOTE — Telephone Encounter (Signed)
I have sent a referral to [facility], please call [phone] to schedule an appointment.

## 2019-12-14 NOTE — Telephone Encounter (Signed)
Patient checked with insurance for pain mgmt specialists and they told her Dr [facility] @ [facility] [place].    Phn # [phone]  Fax# [phone]      Please advise

## 2019-12-19 NOTE — Telephone Encounter (Signed)
Okay to send external referral to Dr. Kari Baars.

## 2019-12-21 ENCOUNTER — Encounter: Attending: Cardiovascular Disease | Primary: Family

## 2019-12-21 ENCOUNTER — Encounter

## 2019-12-21 NOTE — Telephone Encounter (Signed)
I faxed over the referral and called and let the pt know.

## 2019-12-21 NOTE — Telephone Encounter (Signed)
Patient called about her pain management referral. She is trying to schedule an appointment today as they are closed tomorrow      Please advise

## 2019-12-26 ENCOUNTER — Encounter

## 2019-12-26 MED ORDER — SPIRONOLACTONE 25 MG PO TABS
25 MG | ORAL_TABLET | ORAL | 0 refills | Status: DC
Start: 2019-12-26 — End: 2020-01-01

## 2019-12-26 NOTE — Telephone Encounter (Signed)
No longer under prescriber care/ Provider no longer at practice.

## 2019-12-27 ENCOUNTER — Ambulatory Visit: Payer: PRIVATE HEALTH INSURANCE | Primary: Family

## 2019-12-28 NOTE — Telephone Encounter (Signed)
I refaxed referral as the patient states they did not receive it

## 2019-12-29 ENCOUNTER — Ambulatory Visit: Payer: PRIVATE HEALTH INSURANCE | Primary: Family

## 2020-01-01 ENCOUNTER — Encounter

## 2020-01-01 MED ORDER — SPIRONOLACTONE 25 MG PO TABS
25 MG | ORAL_TABLET | ORAL | 0 refills | Status: DC
Start: 2020-01-01 — End: 2020-02-12

## 2020-01-01 NOTE — Telephone Encounter (Signed)
Reason for Disposition  ??? 2 or more boils    Answer Assessment - Initial Assessment Questions  1. APPEARANCE of BOIL: "What does the boil look like?"       Red area on left side of stomach size softball, with bump size of dime.  Draining yellow/green fluid and 1 on buttocks.     2. LOCATION: "Where is the boil located?"       Left side of abdomen.     3. NUMBER: "How many boils are there?"       2.  1 on left side and 1 on crack on buttocks.     4. SIZE: "How big is the boil?" (e.g., inches, cm; compare to size of a coin or other object)      Dime size    5. ONSET: "When did the boil start?"      1 week ago    6. PAIN: "Is there any pain?" If so, ask: "How bad is the pain?"   (Scale 1-10; or mild, moderate, severe)      Constant soreness with sharp pain    7. FEVER: "Do you have a fever?" If so, ask: "What is it, how was it measured, and when did it start?"       No    8. SOURCE: "Have you been around anyone with boils or other Staph infections?" "Have you ever had boils before?"      Not been around anyone with infections, pt had Staph infection a month ago.    9. OTHER SYMPTOMS: "Do you have any other symptoms?" (e.g., shaking chills, weakness, rash elsewhere on body)      Lower back pain shooting down both legs with numbness constantly and in legs and arms that comes and goes.     10. PREGNANCY: "Is there any chance you are pregnant?" "When was your last menstrual period?"        No    Protocols used: BOIL (SKIN ABSCESS)-ADULT-[place]    Patient called pre-service center [facility] with red flag complaint.     Brief description of triage: draining/painful lump on left abdomen and buttocks.  Pain in lower back and down both legs.     Triage indicates for patient to see PCP in office today or tomorrow.     Care advice provided, patient verbalizes understanding; denies any other questions or concerns; instructed to call back for any new or worsening symptoms.    Writer provided warm transfer to [place] at  [facility] for appointment scheduling.    Attention Provider:  Thank you for allowing me to participate in the care of your patient.  The patient was connected to triage in response to information provided to the ECC.  Please do not respond through this encounter as the response is not directed to a shared pool.

## 2020-01-03 ENCOUNTER — Ambulatory Visit: Admit: 2020-01-03 | Discharge: 2020-01-03 | Payer: PRIVATE HEALTH INSURANCE | Attending: Family | Primary: Family

## 2020-01-03 DIAGNOSIS — L723 Sebaceous cyst: Secondary | ICD-10-CM

## 2020-01-03 MED ORDER — GABAPENTIN 600 MG PO TABS
600 MG | ORAL_TABLET | Freq: Two times a day (BID) | ORAL | 0 refills | Status: DC
Start: 2020-01-03 — End: 2020-04-08

## 2020-01-03 MED ORDER — CEPHALEXIN 500 MG PO CAPS
500 MG | ORAL_CAPSULE | Freq: Four times a day (QID) | ORAL | 0 refills | Status: DC
Start: 2020-01-03 — End: 2020-01-05

## 2020-01-03 MED ORDER — MUPIROCIN 2 % EX OINT
2 % | CUTANEOUS | 0 refills | Status: AC
Start: 2020-01-03 — End: 2020-01-10

## 2020-01-03 NOTE — Telephone Encounter (Signed)
-----   Message from [place] sent at 01/01/2020  1:36 PM EST -----  Subject: Appointment Request    Reason for Call: Routine Physical Exam    QUESTIONS  Type of Appointment? Established Patient  Reason for appointment request? Available appointments did not meet   patient need  Additional Information for Provider? PATIENT NEEDS AN APPOINTMENT FOR   WEDNESDAY DUE TO WORK SCHEDUL. SHE HAS BEEN HAVING SEVERE BACK PAIN FOR2   WEEKS NOW. HURTS TO WALK. RED SPOTS ON STOMACH THE SIZE OF A DIME.   DECLINED TRIAGE. SCREENED GREEN.   ---------------------------------------------------------------------------  --------------  CALL BACK INFO  What is the best way for the office to contact you? OK to leave message on   voicemail  Preferred Call Back Phone Number? [phone]  ---------------------------------------------------------------------------  --------------  SCRIPT ANSWERS  Relationship to Patient? Self  Appointment reason? Well Care/Follow Ups  Select a Well Care/Follow Ups appointment reason? Adult Physical Exam   [Medicare Annual Wellness   AWV   PAP   Pelvic]  (If the patient is Medicare / 65+ ask this question) Are you requesting a   Medicare Annual Wellness Visit? No  (If the patient is female   ask this question) Are you requesting a pap smear with your physical   exam? No  (Is the patient requesting their annual physical and does not need PAP or   AWV per above)? Yes   Have you been diagnosed with   tested for   or told that you are suspected of having COVID-19 (Coronavirus)? No  Have you had a fever or taken medication to treat a fever within the past   3 days? No  Have you had a cough   shortness of breath or flu-like symptoms within the past 3 days? No  Do you currently have flu-like symptoms including fever or chills   cough   shortness of breath   or difficulty breathing   or new loss of taste or smell? No  (Service Expert ??? click yes below to proceed with [employer] As Usual   Scheduling)? Yes

## 2020-01-03 NOTE — Telephone Encounter (Signed)
-----   Message from [place] Br[place] sent at 01/01/2020  1:59 PM EST -----  Subject: Appointment Request    Reason for Call: Urgent Return from RN Triage    QUESTIONS  Type of Appointment? Established Patient  Reason for appointment request? No appointments available during search  Additional Information for Provider? Patient is only available for   appointment 11-1pm due to work.  ---------------------------------------------------------------------------  --------------  [place] INFO  What is the best way for the office to contact you? OK to leave message on   voicemail  Preferred Call Back Phone Number? [phone]  ---------------------------------------------------------------------------  --------------  SCRIPT ANSWERS  Patient needs to be seen today or tomorrow? Yes  Nurse Name? Loma Sousa  (Did RN indicate the need for Red Scheduling?)? No  Have you been diagnosed with COVID-19 (Coronavirus)   tested for COVID-19 (Coronavirus)   or told that you are suspected of having COVID-19 (Coronavirus)? No  Have you had a fever or taken medication to treat a fever within the past   3 days? No  Have you had a cough   shortness of breath or flu-like symptoms within the past 3 days? No  Do you currently have flu-like symptoms including fever or chills   cough   shortness of breath   or difficulty breathing   or new loss of taste or smell? No  (Service Expert ??? click yes below to proceed with [employer] As Usual   Scheduling)? Yes

## 2020-01-03 NOTE — Telephone Encounter (Signed)
-----   Message from [place] sent at 01/01/2020  1:36 PM EST -----  Subject: Appointment Request    Reason for Call: Routine Physical Exam    QUESTIONS  Type of Appointment? Established Patient  Reason for appointment request? Available appointments did not meet   patient need  Additional Information for Provider? PATIENT NEEDS AN APPOINTMENT FOR   WEDNESDAY DUE TO WORK SCHEDUL. SHE HAS BEEN HAVING SEVERE BACK PAIN FOR2   WEEKS NOW. HURTS TO WALK. RED SPOTS ON STOMACH THE SIZE OF A DIME.   DECLINED TRIAGE. SCREENED GREEN.   ---------------------------------------------------------------------------  --------------  CALL BACK INFO  What is the best way for the office to contact you? OK to leave message on   voicemail  Preferred Call Back Phone Number? [phone]  ---------------------------------------------------------------------------  --------------  SCRIPT ANSWERS  Relationship to Patient? Self  Appointment reason? Well Care/Follow Ups  Select a Well Care/Follow Ups appointment reason? Adult Physical Exam   [Medicare Annual Wellness   AWV   PAP   Pelvic]  (If the patient is Medicare / 65+ ask this question) Are you requesting a   Medicare Annual Wellness Visit? No  (If the patient is female   ask this question) Are you requesting a pap smear with your physical   exam? No  (Is the patient requesting their annual physical and does not need PAP or   AWV per above)? Yes   Have you been diagnosed with   tested for   or told that you are suspected of having COVID-19 (Coronavirus)? No  Have you had a fever or taken medication to treat a fever within the past   3 days? No  Have you had a cough   shortness of breath or flu-like symptoms within the past 3 days? No  Do you currently have flu-like symptoms including fever or chills   cough   shortness of breath   or difficulty breathing   or new loss of taste or smell? No  (Service Expert ??? click yes below to proceed with [employer] As Usual   Scheduling)? Yes

## 2020-01-05 MED ORDER — CLINDAMYCIN HCL 300 MG PO CAPS
300 MG | ORAL_CAPSULE | Freq: Three times a day (TID) | ORAL | 0 refills | Status: AC
Start: 2020-01-05 — End: 2020-01-15

## 2020-01-05 NOTE — Telephone Encounter (Signed)
Patient advised of information.

## 2020-01-05 NOTE — Telephone Encounter (Signed)
-----   Message from Magn[place] Sinning sent at 01/05/2020  1:44 PM EST -----  Subject: Message to Provider    QUESTIONS  Information for Provider? msg for [place] who asked that she call in today.   Abscesses are getting worse redness is spreading   more red and are hurting   she is taking medicine 4 x a day also getting chills   the one on her stomach is bleeding & is yellowish on bandage  ---------------------------------------------------------------------------  --------------  CALL BACK INFO  What is the best way for the office to contact you? OK to leave message on   voicemail  Preferred Call Back Phone Number? [phone]  ---------------------------------------------------------------------------  --------------  SCRIPT ANSWERS  Relationship to Patient? Self

## 2020-01-05 NOTE — Telephone Encounter (Signed)
Discontinue Keflex antibiotic and clindamycin were sent to the pharmacy.  If symptoms not improved by Monday she may need an I&D of the area.  Certainly should use warm compresses 3 times daily

## 2020-01-10 ENCOUNTER — Encounter: Attending: Dermatology | Primary: Family

## 2020-01-12 ENCOUNTER — Encounter

## 2020-01-15 MED ORDER — OMEPRAZOLE 40 MG PO CPDR
40 MG | ORAL_CAPSULE | ORAL | 0 refills | Status: DC
Start: 2020-01-15 — End: 2020-04-02

## 2020-01-17 NOTE — Telephone Encounter (Signed)
Patient called to find out if her XR of her spine was sent to Dr Kari Baars    Please advise and give her a callback

## 2020-01-18 NOTE — Telephone Encounter (Signed)
Unaware if records have been faxed, but I did fax over the back x-ray that is from 10/06/2019 over to Dr. Kari Baars office. Did call and leave message for patient to call back to advise.

## 2020-01-18 NOTE — Telephone Encounter (Signed)
Patient informed

## 2020-01-22 ENCOUNTER — Encounter: Payer: PRIVATE HEALTH INSURANCE | Attending: Family | Primary: Family

## 2020-01-22 NOTE — Telephone Encounter (Signed)
Patient called [place] pre-service center [facility])  with red flag complaint.     Brief description of triage: Back pain    Triage indicates for patient to ED    Care advice provided, patient verbalizes understanding; denies any other questions or concerns; instructed to call back for any new or worsening symptoms.      Reason for Disposition  ??? Pain mainly in flank (i.e., in the side, over the lower ribs or just below the ribs)  ??? [1] Sudden onset of severe flank pain AND [2] age > 76    Answer Assessment - Initial Assessment Questions  1. ONSET: "When did the pain begin?"   About 3 weeks ago        2. LOCATION: "Where does it hurt?" (upper, mid or lower back)  Right side, a couple inches below rib cage    3. SEVERITY: "How bad is the pain?"  (e.g., Scale 1-10; mild, moderate, or severe)    - MILD (1-3): doesn't interfere with normal activities     - MODERATE (4-7): interferes with normal activities or awakens from sleep     - SEVERE (8-10): excruciating pain, unable to do any normal activities   10/10 when having the pain for a few seconds suddenly at a time; at rest with heat rates 07/10    4. PATTERN: "Is the pain constant?" (e.g., yes, no; constant, intermittent)   Constant, changes in severity    5. RADIATION: "Does the pain shoot into your legs or elsewhere?"  Stays in place     6. CAUSE:  "What do you think is causing the back pain?"   Denies, possibly mattress    7. BACK OVERUSE:  "Any recent lifting of heavy objects, strenuous work or exercise?"  Denies    8. MEDICATIONS: "What have you taken so far for the pain?" (e.g., nothing, acetaminophen, NSAIDS)  Tramadol, Tylenol    9. NEUROLOGIC SYMPTOMS: "Do you have any weakness, numbness, or problems with bowel/bladder control?"  Numbness, tingling in both arms (started about 3 weeks ago); patient states "urine looks really yellow and the other day urine seemed thick"    10. OTHER SYMPTOMS: "Do you have any other symptoms?" (e.g., fever, abdominal pain, burning  with urination, blood in urine)  Denies     11. PREGNANCY: "Is there any chance you are pregnant?" (e.g., yes, no; LMP)  No    Protocols used: BACK PAIN-ADULT-AH, FLANK PAIN-ADULT-AH      Attention Provider:  Thank you for allowing me to participate in the care of your patient.  The patient was connected to triage in response to information provided to the ECC.  Please do not respond through this encounter as the response is not directed to a shared pool.

## 2020-01-26 ENCOUNTER — Encounter: Attending: Cardiovascular Disease | Primary: Family

## 2020-02-07 ENCOUNTER — Ambulatory Visit: Admit: 2020-02-07 | Discharge: 2020-02-07 | Payer: PRIVATE HEALTH INSURANCE | Attending: Family | Primary: Family

## 2020-02-07 DIAGNOSIS — L03311 Cellulitis of abdominal wall: Secondary | ICD-10-CM

## 2020-02-07 MED ORDER — CHLORHEXIDINE GLUCONATE 4 % EX LIQD
4 | CUTANEOUS | 0 refills | Status: AC
Start: 2020-02-07 — End: 2020-02-21

## 2020-02-07 MED ORDER — DOXYCYCLINE HYCLATE 100 MG PO TABS
100 MG | ORAL_TABLET | Freq: Two times a day (BID) | ORAL | 0 refills | Status: AC
Start: 2020-02-07 — End: 2020-03-08

## 2020-02-07 MED ORDER — FLUCONAZOLE 150 MG PO TABS
150 MG | ORAL_TABLET | Freq: Every day | ORAL | 0 refills | Status: AC
Start: 2020-02-07 — End: 2020-02-10

## 2020-02-07 NOTE — Progress Notes (Signed)
Rachael Mays  DOB: [birth year]  Encounter date: 02/07/2020    This isa 64 y.o. female who presents with  Chief Complaint   Patient presents with   ??? Abscess     Abscess on abdominal and Lt leg        History of present illness:    HPI Pt is 64 year old female with ongoing abscess on abdomen and L buttocks treated with keflex.  Pt then went to [facility] ED where she received IV clindamycin and oral clindamycin.  Pt denies fevers but reports tender to touch.  Reports drainage from abdomen.  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  Pt has seen wound care in past, provided with supplies.  Pt has upcoming appt with Dr. Sunny Schlein for neuropathy.  Pt reports high risk for yeast infections due to multiple ABX in past 3 months.    Current Outpatient Medications on File Prior to Visit   Medication Sig Dispense Refill   ??? oxyCODONE-acetaminophen (PERCOCET) 5-325 MG per tablet      ??? omeprazole (PRILOSEC) 40 MG delayed release capsule TAKE ONE CAPSULE BY MOUTH DAILY 20 capsule 0   ??? spironolactone (ALDACTONE) 25 MG tablet TAKE ONE AND ONE-HALF TABLET BY MOUTH DAILY 38 tablet 0   ??? montelukast (SINGULAIR) 10 MG tablet TAKE ONE TABLET BY MOUTH DAILY 30 tablet 2   ??? rosuvastatin (CRESTOR) 10 MG tablet TAKE ONE TABLET BY MOUTH DAILY 90 tablet 3   ??? zoster recombinant adjuvanted vaccine (SHINGRIX) 50 MCG/0.5ML SUSR injection Inject 0.5 mLs into the muscle See Admin Instructions 1 dose now and repeat in 2-6 months 0.5 mL 0   ??? ondansetron (ZOFRAN) 4 MG tablet Take 1 tablet by mouth 3 times daily as needed for Nausea or Vomiting 30 tablet 0   ??? silver sulfADIAZINE (SILVADENE) 1 % cream Apply topically to the wound on the left thigh daily. 25 g 1   ??? ranolazine (RANEXA) 1000 MG extended release tablet Take 1 tablet by mouth 2 times daily 60 tablet 5   ??? Cholecalciferol (VITAMIN D3) 50 MCG (2000 UT) CAPS Take 1 capsule by mouth daily     ??? calcium-vitamin D (CALCIUM 500/D) 500-200 MG-UNIT per tablet Take 1 tablet by mouth  daily     ??? lisinopril (PRINIVIL;ZESTRIL) 40 MG tablet TAKE ONE TABLET BY MOUTH DAILY 90 tablet 1   ??? acetic acid-hydrocortisone (VOSOL-HC) 1-2 % otic solution 4 drops in affected ear or ears every 3 hours while awake 15 mL 1   ??? albuterol sulfate HFA (PROVENTIL HFA) 108 (90 Base) MCG/ACT inhaler Inhale 2 puffs into the lungs every 6 hours as needed for Wheezing 3 Inhaler 3   ??? metoprolol (LOPRESSOR) 100 MG tablet Take 1 twice a day 270 tablet 1   ??? lidocaine (LIDODERM) 5 % Place 1 patch onto the skin daily 12 hours on, 12 hours off. 30 patch 0   ??? nitroGLYCERIN (NITROSTAT) 0.4 MG SL tablet up to max of 3 total doses. If no relief after 1 dose, call 911. 25 tablet 3   ??? prasugrel (EFFIENT) 10 MG TABS Take 1 tablet by mouth daily 90 tablet 3   ??? torsemide (DEMADEX) 20 MG tablet Take 1 tablet by mouth daily as needed (swelling) 30 tablet 1   ??? aspirin 81 MG EC tablet Take 1 tablet by mouth daily 30 tablet 11   ??? Multiple Vitamins-Minerals (MULTIVITAMIN PO) Take 1 tablet by mouth daily      ???  gabapentin (NEURONTIN) 600 MG tablet Take 1 tablet by mouth 2 times daily for 30 days. 60 tablet 0     No current facility-administered medications on file prior to visit.       Allergies   Allergen Reactions   ??? Seasonal    ??? Talwin [Pentazocine] Other (See Comments)     dizzy     Past Medical History:   Diagnosis Date   ??? Anxiety    ??? Asthma    ??? Blood circulation, collateral    ??? CAD (coronary artery disease)    ??? CHF (congestive heart failure) ([place])    ??? Chronic back pain    ??? Deep vein thrombosis    ??? 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 Depression    ??? GERD (gastroesophageal reflux disease)     NO LONGER SINCE LAP   ??? GERD (gastroesophageal reflux disease) 01/23/2009   ??? Hx of blood clots    ??? Hyperlipidemia     hx; resolved with lap band   ??? Hypertension    ??? Idiopathic peripheral neuropathy 03/26/2017   ??? Melanoma ([place])     upper back   ??? MRSA (methicillin resistant staph aureus) culture positive  10/17/2019    leg abscess   ??? Obesity     hx of; had lap band   ??? Obstructive sleep apnea 09/18/2015     Updating Deprecated Diagnoses   ??? Obstructive sleep apnea (adult) (pediatric) 09/18/2015   ??? Pulmonary embolism [facility])       Past Surgical History:   Procedure Laterality Date   ??? BACK SURGERY      neck  plates   ??? BREAST SURGERY      left lumpectomy   ??? CESAREAN SECTION     ??? CHOLECYSTECTOMY     ??? COLONOSCOPY     ??? COLONOSCOPY  04/08/10   ??? CORONARY ANGIOPLASTY     ??? DIAGNOSTIC CARDIAC CATH LAB PROCEDURE     ??? DILATATION, ESOPHAGUS     ??? ENDOSCOPY, COLON, DIAGNOSTIC     ??? HYSTERECTOMY, VAGINAL     ??? LAP BAND  05/01/08    Dr. Nicole Cella   ??? OTHER SURGICAL HISTORY      [place] Filter: curently in place   ??? OTHER SURGICAL HISTORY  07/05/13    lap band removal   ??? SHOULDER SURGERY     ??? SKIN CANCER EXCISION  12/29/2017   ??? UPPER GASTROINTESTINAL ENDOSCOPY  05/19/13   ??? UPPER GASTROINTESTINAL ENDOSCOPY  07/21/13    ESOPHAGEAL STENT PLACEMENT   ??? UPPER GASTROINTESTINAL ENDOSCOPY N/A 07/31/2014    Esophagogastroduodenoscopy with esophageal balloon dilation   ??? VASCULAR SURGERY  04/2015    Lucendia Herrlich, celiac artery angiogram, normal abd arteries      Family History   Problem Relation Age of Onset   ??? Arthritis Mother    ??? Cancer Mother    ??? Depression Mother    ??? High Blood Pressure Mother    ??? Heart Disease Mother 37        MI    ??? Ovarian Cancer Mother 36   ??? Cancer Father 41        colon   ??? Heart Disease Maternal Grandmother    ??? Early Death Paternal Grandmother    ??? Heart Disease Paternal Grandfather    ??? Diabetes Other    ??? High Blood Pressure Other    ??? Obesity Other    ??? Other  Sister         OSA   ??? Other Brother         OSA   ??? Other Brother         OSA   ??? Other Daughter         OSA      Social History     Tobacco Use   ??? Smoking status: Former Smoker     Packs/day: 0.50     Years: 40.00     Pack years: 20.00     Types: Cigarettes     Quit date: 08/07/2012     Years since quitting: 7.5   ??? Smokeless tobacco: Former Systems developer      Quit date: 06/16/2013   ??? Tobacco comment: started to smoke at age 47 / only smoked 0.5 p.p.d    Substance Use Topics   ??? Alcohol use: No     Alcohol/week: 0.0 standard drinks        Review of Systems   Constitutional: Negative for activity change, chills, fatigue and fever.   Musculoskeletal: Positive for myalgias.   Skin: Positive for wound (posterior L buttocks, L abdomen).   Allergic/Immunologic: Negative for immunocompromised state.   Neurological: Positive for numbness.   Hematological: Does not bruise/bleed easily.   Psychiatric/Behavioral: Positive for sleep disturbance.       Objective:    BP 102/64 (Site: Left Upper Arm, Position: Sitting, Cuff Size: Large Adult)    Pulse 64    Temp 98 ??F (36.7 ??C) (Temporal)    Ht 5\' 5"  (1.651 m)    Wt (!) 302 lb (137 kg)    SpO2 98%    BMI 50.26 kg/m??   Weight: (!) 302 lb (137 kg)     BP Readings from Last 3 Encounters:   02/07/20 102/64   01/03/20 108/68   11/21/19 128/61     Wt Readings from Last 3 Encounters:   02/07/20 (!) 302 lb (137 kg)   01/03/20 (!) 312 lb (141.5 kg)   11/01/19 (!) 312 lb 6.4 oz (141.7 kg)     BMI Readings from Last 3 Encounters:   02/07/20 50.26 kg/m??   01/03/20 51.92 kg/m??   11/01/19 51.99 kg/m??       Physical Exam  Vitals signs reviewed.   Constitutional:       Appearance: Normal appearance. She is well-developed. She is obese.   Cardiovascular:      Rate and Rhythm: Normal rate and regular rhythm.      Heart sounds: Normal heart sounds. No murmur.   Pulmonary:      Effort: Pulmonary effort is normal.      Breath sounds: Normal breath sounds.   Musculoskeletal:      Right lower leg: Edema present.      Left lower leg: Edema present.   Skin:     General: Skin is warm and dry.      Findings: Lesion present.             Comments: 1 inch annular lesion abdomen, beefy appearance, serous drainage, buttocks lesion well adhered, no visible openin   Neurological:      Mental Status: She is alert and oriented to person, place, and time.   Psychiatric:          Mood and Affect: Mood normal.         Assessment/Plan    1. Cellulitis of abdominal wall  Advised assessment from wound care, pt will reach  out  Advised to cleanse daily  Discussed s/s infection  - chlorhexidine (HIBICLENS) 4 % external liquid; Apply topically daily as needed.  Dispense: 473 mL; Refill: 0  - doxycycline hyclate (VIBRA-TABS) 100 MG tablet; Take 1 tablet by mouth 2 times daily  Dispense: 60 tablet; Refill: 0  - Culture, Aerobic and Anaerobic    2. Yeast infection  Advised OTC probiotics  - fluconazole (DIFLUCAN) 150 MG tablet; Take 1 tablet by mouth daily for 3 days  Dispense: 3 tablet; Refill: 0      No follow-ups on file.    This dictation was generated by voice recognition computer software.  Although all attempts are made to edit the dictation for accuracy, there may be errors in the transcription that are not intended.

## 2020-02-11 ENCOUNTER — Encounter

## 2020-02-12 ENCOUNTER — Encounter

## 2020-02-12 LAB — CULTURE, ANAEROBIC AND AEROBIC: Gram Stain Result: NONE SEEN

## 2020-02-12 MED ORDER — SPIRONOLACTONE 25 MG PO TABS
25 MG | ORAL_TABLET | ORAL | 0 refills | Status: DC
Start: 2020-02-12 — End: 2022-01-26

## 2020-02-12 MED ORDER — MECLIZINE HCL 25 MG PO TABS
25 MG | ORAL_TABLET | ORAL | 0 refills | Status: DC
Start: 2020-02-12 — End: 2020-06-24

## 2020-02-12 MED ORDER — PRASUGREL HCL 10 MG PO TABS
10 MG | ORAL_TABLET | ORAL | 2 refills | Status: DC
Start: 2020-02-12 — End: 2020-04-03

## 2020-02-12 NOTE — Telephone Encounter (Signed)
Medication:   Pending Prescriptions Disp Refills   ??? meclizine (ANTIVERT) 25 MG tablet  15 tablet 0     Sig: TAKE ONE TABLET BY MOUTH THREE TIMES A DAY AS NEEDED FOR DIZZINESS        Patient Phone Number: [phone]     Last appt: 02/07/2020   Next appt: Visit date not found    Last OARRS:   RX Monitoring 10/06/2018   Attestation -   Periodic Controlled Substance Monitoring No signs of potential drug abuse or diversion identified.;Possible medication side effects, risk of tolerance/dependence & alternative treatments discussed.;Assessed functional status.;Obtaining appropriate analgesic effect of treatment.   Chronic Pain > 80 MEDD -       Last PDMP Elta Guadeloupe as Reviewed [facility]):  Review User Review Instant Review Result   Truett Mainland 01/03/2020 11:13 AM Reviewed PDMP [1]     Preferred Pharmacy:   [facility] [address] [place] [id number] - F [phone]

## 2020-02-14 NOTE — Telephone Encounter (Signed)
Patient is calling to give a name neurologists referral     Dr. Theodis Sato    [phone]   Fax      Please fax

## 2020-02-14 NOTE — Telephone Encounter (Signed)
Referral needs placed. Ill fax over once done please advise.

## 2020-02-15 ENCOUNTER — Encounter

## 2020-02-15 NOTE — Telephone Encounter (Signed)
Referral faxed, please notify patient.

## 2020-02-15 NOTE — Telephone Encounter (Signed)
I called the pt and informed her that this was faxed.

## 2020-02-16 NOTE — Telephone Encounter (Signed)
Rachael Mays from [facility] [phone] states they received the order for wound dressing on 3/11 from [place] but the order is missing the width/length/depth of both wounds.    She states Marguarite Arbour can re-submit the same form but just add the measurements.    Provider out of office      Please advise

## 2020-02-19 NOTE — Telephone Encounter (Signed)
Please notify [facility] that there was no depth of wound and it was an inch all around.

## 2020-02-19 NOTE — Telephone Encounter (Signed)
The form is scanned into the pt Media. On the form where it is asking for measurements: it was filled in as 1" in annular. There is no where on the form where it ask for width/length/depth just states measurements.     Pls advise what you would like me to do.

## 2020-02-19 NOTE — Telephone Encounter (Signed)
I spoke with Rachael Mays and she states that they need the form faxed back over with this on the form. Rachael Mays also states that the form needed to be changed to moderate drainage.     CNP. Wojciechowski reprinted the form off for you, I put the info that she is requesting to be changed. I will place it with your stuff for review.

## 2020-02-21 ENCOUNTER — Encounter: Attending: Clinical Neurophysiology | Primary: Family

## 2020-02-27 ENCOUNTER — Encounter

## 2020-02-27 MED ORDER — LISINOPRIL 40 MG PO TABS
40 MG | ORAL_TABLET | ORAL | 1 refills | Status: DC
Start: 2020-02-27 — End: 2021-08-07

## 2020-02-27 NOTE — Telephone Encounter (Signed)
Medication:   Pending Prescriptions Disp Refills   ??? lisinopril (PRINIVIL;ZESTRIL) 40 MG tablet 90 tablet 1     Sig: TAKE ONE TABLET BY MOUTH DAILY        Patient Phone Number: [phone] (home)     Last appt: 02/07/2020   Next appt: Visit date not found    Last OARRS:   RX Monitoring 10/06/2018   Attestation -   Periodic Controlled Substance Monitoring No signs of potential drug abuse or diversion identified.;Possible medication side effects, risk of tolerance/dependence & alternative treatments discussed.;Assessed functional status.;Obtaining appropriate analgesic effect of treatment.   Chronic Pain > 80 MEDD -       Preferred Pharmacy:   [facility] [address] [place] [id number] - F [phone]

## 2020-02-27 NOTE — Telephone Encounter (Signed)
lisinopril (PRINIVIL;ZESTRIL) 40 MG tablet [id number]    [facility] [address] [place] - [id number] - F [phone]

## 2020-03-17 ENCOUNTER — Encounter

## 2020-03-18 MED ORDER — MONTELUKAST SODIUM 10 MG PO TABS
10 MG | ORAL_TABLET | ORAL | 1 refills | Status: DC
Start: 2020-03-18 — End: 2020-04-23

## 2020-03-18 NOTE — Telephone Encounter (Signed)
Medication:   Pending Prescriptions Disp Refills   ??? montelukast (SINGULAIR) 10 MG tablet [Pharmacy Med Name: MONTELUKAST SOD 10 MG TABLET] 30 tablet 1     Sig: TAKE ONE TABLET BY MOUTH DAILY        Patient Phone Number: [phone] (home)     Last appt: 02/07/2020   Next appt: Visit date not found    Last OARRS:   RX Monitoring 10/06/2018   Attestation -   Periodic Controlled Substance Monitoring No signs of potential drug abuse or diversion identified.;Possible medication side effects, risk of tolerance/dependence & alternative treatments discussed.;Assessed functional status.;Obtaining appropriate analgesic effect of treatment.   Chronic Pain > 80 MEDD -       Last PDMP Elta Guadeloupe as Reviewed [facility]):  Review User Review Instant Review Result   Truett Mainland 01/03/2020 11:13 AM Reviewed PDMP [1]     Preferred Pharmacy:   [facility] [address] [place] [id number] - F [phone]

## 2020-03-27 MED ORDER — FLUOCINONIDE 0.05 % EX SOLN
0.05 % | CUTANEOUS | 0 refills | Status: DC
Start: 2020-03-27 — End: 2020-07-01

## 2020-04-02 ENCOUNTER — Encounter

## 2020-04-02 MED ORDER — OMEPRAZOLE 40 MG PO CPDR
40 MG | ORAL_CAPSULE | ORAL | 0 refills | Status: DC
Start: 2020-04-02 — End: 2020-04-07

## 2020-04-03 ENCOUNTER — Ambulatory Visit: Admit: 2020-04-03 | Discharge: 2020-04-04 | Payer: PRIVATE HEALTH INSURANCE | Attending: Dermatology | Primary: Family

## 2020-04-03 DIAGNOSIS — D229 Melanocytic nevi, unspecified: Secondary | ICD-10-CM

## 2020-04-03 MED ORDER — NYSTATIN 100000 UNIT/GM EX CREA
100000 UNIT/GM | CUTANEOUS | 1 refills | Status: DC
Start: 2020-04-03 — End: 2020-06-19

## 2020-04-05 ENCOUNTER — Encounter

## 2020-04-05 NOTE — Telephone Encounter (Signed)
Medication:   Requested Prescriptions     Pending Prescriptions Disp Refills   ??? gabapentin (NEURONTIN) 300 MG capsule [Pharmacy Med Name: GABAPENTIN 300 MG CAPSULE] 60 capsule 4     Sig: TAKE TWO CAPSULES BY MOUTH ONCE NIGHTLY      Last Filled:      Patient Phone Number: [phone] (home)     Last appt: 02/07/2020   Next appt: Visit date not found    Last OARRS:   RX Monitoring 10/06/2018   Attestation -   Periodic Controlled Substance Monitoring No signs of potential drug abuse or diversion identified.;Possible medication side effects, risk of tolerance/dependence & alternative treatments discussed.;Assessed functional status.;Obtaining appropriate analgesic effect of treatment.   Chronic Pain > 80 MEDD -     PDMP Monitoring:    Last PDMP Elta Guadeloupe as Reviewed [facility]):  Review User Review Instant Review Result   Truett Mainland 01/03/2020 11:13 AM Reviewed PDMP [1]     Preferred Pharmacy:   [facility] [address], [place] [id number] Wanda Plump [phone]  [address]  [place] [id number]  Phone: [phone] Fax: [phone]    [place], [place], [id number] - P [phone] - F [phone]  [id number]  [place] [address]  Phone: [phone] Fax: [phone]    [facility] [place], [place] - [place] [phone] Wanda Plump [phone]  [place]  [address]  [place] [address]  Phone: [phone] Fax: [phone]    [facility] [place], [place]  [place] [address]  Phone: [phone] Fax: [phone]  Medication:   Requested Prescriptions     Pending Prescriptions Disp Refills   ??? gabapentin (NEURONTIN) 300 MG capsule [Pharmacy Med Name: GABAPENTIN 300 MG CAPSULE] 60 capsule 4     Sig: TAKE TWO CAPSULES BY MOUTH ONCE NIGHTLY      Last Filled:      Patient Phone Number: [phone] (home)      Last appt: 02/07/2020   Next appt: Visit date not found    Last OARRS:   RX Monitoring 10/06/2018   Attestation -   Periodic Controlled Substance Monitoring No signs of potential drug abuse or diversion identified.;Possible medication side effects, risk of tolerance/dependence & alternative treatments discussed.;Assessed functional status.;Obtaining appropriate analgesic effect of treatment.   Chronic Pain > 80 MEDD -     PDMP Monitoring:    Last PDMP Elta Guadeloupe as Reviewed [facility]):  Review User Review Instant Review Result   Truett Mainland 01/03/2020 11:13 AM Reviewed PDMP [1]     Preferred Pharmacy:   [facility] [address], [place] [id number] Wanda Plump [phone]  [address]  [place] [id number]  Phone: [phone] Fax: [phone]    [place], [place], [id number] - P [phone] - F [phone]  [id number]  [place] [address]  Phone: [phone] Fax: [phone]    [facility] [place], [place] [phone]  [place]  [address]  [place] [address]  Phone: [phone] Fax: [phone]    [place], [place]  P [phone] Wanda Plump [id number]  [address]  [place] [address]  Phone: [phone] Fax: [phone]

## 2020-04-07 MED ORDER — OMEPRAZOLE 40 MG PO CPDR
40 MG | ORAL_CAPSULE | ORAL | 0 refills | Status: DC
Start: 2020-04-07 — End: 2020-08-06

## 2020-04-08 MED ORDER — GABAPENTIN 300 MG PO CAPS
300 MG | ORAL_CAPSULE | ORAL | 4 refills | Status: DC
Start: 2020-04-08 — End: 2021-02-17

## 2020-04-08 NOTE — Telephone Encounter (Signed)
The patient saw dr [name] last week for a rash and was prescribed nystatin cream and the rash is worse and not better.  Can she try something else?     Please call [phone]

## 2020-04-08 NOTE — Telephone Encounter (Signed)
 Patient was prescribed Nystatin cream for yeast in groin/inguinal folds. Patient complains that it has gotten worse and is requesting an alternative prescription.     Please advise.

## 2020-04-08 NOTE — Telephone Encounter (Signed)
Does she have any triamcinolone cream left at home?  If so, I'd like her to use both the triamcinolone and nystatin cream to the affected areas twice per day until improved.

## 2020-04-09 MED ORDER — TRIAMCINOLONE ACETONIDE 0.1 % EX CREA
0.1 % | CUTANEOUS | 2 refills | Status: DC
Start: 2020-04-09 — End: 2020-06-17

## 2020-04-09 NOTE — Telephone Encounter (Signed)
Called and left detailed message for patient to call back office.

## 2020-04-09 NOTE — Telephone Encounter (Signed)
Rx sent.

## 2020-04-09 NOTE — Telephone Encounter (Signed)
Patient calling back regarding message from [facility] patient says she does not have any of the triamcinolone cream left so she will need a refill for the cream.  Preferred pharmacy [facility] [address], [place] [id number] Wanda Plump [phone]   [address] [place] [id number]

## 2020-04-23 ENCOUNTER — Encounter

## 2020-04-23 MED ORDER — MONTELUKAST SODIUM 10 MG PO TABS
10 MG | ORAL_TABLET | ORAL | 0 refills | Status: DC
Start: 2020-04-23 — End: 2020-05-07

## 2020-04-23 NOTE — Telephone Encounter (Signed)
Medication:   Pending Prescriptions Disp Refills   ??? montelukast (SINGULAIR) 10 MG tablet [Pharmacy Med Name: MONTELUKAST SOD 10 MG TABLET] 7 tablet 0     Sig: TAKE ONE TABLET BY MOUTH DAILY      Last Filled:  03/18/20     Patient Phone Number: [phone] (home)     Last appt: 02/07/2020   Next appt: Visit date not found    Last OARRS:   RX Monitoring 10/06/2018   Attestation -   Periodic Controlled Substance Monitoring No signs of potential drug abuse or diversion identified.;Possible medication side effects, risk of tolerance/dependence & alternative treatments discussed.;Assessed functional status.;Obtaining appropriate analgesic effect of treatment.   Chronic Pain > 80 MEDD -     Preferred Pharmacy:   [facility] [address] [place] [id number] - F [phone]      OUR OFFICE TRIED TO SEND THIS TO THE PHAR,ACY ON 04/18/20 BUT THE RX NEVER WAS RECEIVED.   PLEASE RESEND

## 2020-04-23 NOTE — Telephone Encounter (Signed)
Left message to callback to see if  the patient is currently on ranexa.[facility] [address]    Name from pharmacy: RANOLAZINE ER 1,000 MG TABLET          Will file in chart as: ranolazine (RANEXA) 1000 MG extended release tablet    The original prescription was discontinued on 04/03/2020 by Francisca December, MD for the following reason: LIST CLEANUP. Renewing this prescription may not be appropriate.    Sig: TAKE ONE TABLET BY MOUTH TWICE A DAY

## 2020-05-06 ENCOUNTER — Encounter

## 2020-05-07 MED ORDER — MONTELUKAST SODIUM 10 MG PO TABS
10 MG | ORAL_TABLET | ORAL | 0 refills | Status: DC
Start: 2020-05-07 — End: 2020-05-14

## 2020-05-07 NOTE — Telephone Encounter (Signed)
Medication:   Requested Prescriptions     Pending Prescriptions Disp Refills   ??? montelukast (SINGULAIR) 10 MG tablet [Pharmacy Med Name: MONTELUKAST SOD 10 MG TABLET] 7 tablet 0     Sig: TAKE ONE TABLET BY MOUTH DAILY       Patient Phone Number: [phone] (home)     Last appt: 02/07/2020   Next appt: Visit date not found    Last OARRS:   RX Monitoring 10/06/2018   Attestation -   Periodic Controlled Substance Monitoring No signs of potential drug abuse or diversion identified.;Possible medication side effects, risk of tolerance/dependence & alternative treatments discussed.;Assessed functional status.;Obtaining appropriate analgesic effect of treatment.   Chronic Pain > 80 MEDD -     PDMP Monitoring:    Last PDMP Elta Guadeloupe as Reviewed [facility]):  Review User Review Instant Review Result   Truett Mainland 01/03/2020 11:13 AM Reviewed PDMP [1]     Preferred Pharmacy:   [facility] [address], [place] [id number] Wanda Plump [phone]  [address]  [place] [id number]  Phone: [phone] Fax: [phone]    [place], [place], [id number] - P [phone] - F [phone]  [id number]  [place] [address]  Phone: [phone] Fax: [phone]    [facility] [place], [place] - [place] [phone]  [place]  [address]  [place] [address]  Phone: [phone] Fax: [phone]    [facility], [place]  [place]  [place] [address]  Phone: [phone] Fax: [phone]

## 2020-05-12 ENCOUNTER — Encounter

## 2020-05-13 NOTE — Telephone Encounter (Signed)
Medication was D/C on 04/03/2020 by Francisca December, MD

## 2020-05-14 MED ORDER — MONTELUKAST SODIUM 10 MG PO TABS
10 MG | ORAL_TABLET | ORAL | 0 refills | Status: DC
Start: 2020-05-14 — End: 2020-06-11

## 2020-05-14 NOTE — Telephone Encounter (Signed)
Medication:   Requested Prescriptions     Pending Prescriptions Disp Refills   ??? montelukast (SINGULAIR) 10 MG tablet [Pharmacy Med Name: MONTELUKAST SOD 10 MG TABLET] 7 tablet 0     Sig: TAKE ONE TABLET BY MOUTH DAILY      Last Filled:      Patient Phone Number: [phone] (home)     Last appt: 02/07/2020   Next appt: Visit date not found    Last OARRS:   RX Monitoring 10/06/2018   Attestation -   Periodic Controlled Substance Monitoring No signs of potential drug abuse or diversion identified.;Possible medication side effects, risk of tolerance/dependence & alternative treatments discussed.;Assessed functional status.;Obtaining appropriate analgesic effect of treatment.   Chronic Pain > 80 MEDD -     PDMP Monitoring:    Last PDMP Elta Guadeloupe as Reviewed [facility]):  Review User Review Instant Review Result   Truett Mainland 01/03/2020 11:13 AM Reviewed PDMP [1]     Preferred Pharmacy:   [facility] [address], [place] [phone] Wanda Plump [phone]  [address]  [place] [address]  Phone: [phone] Fax: [phone]    [place], [place], [address] - P [phone] - F [phone]  [address], [address]  [place] [address]  Phone: [phone] Fax: [phone]    [facility] [place], [place] - [place] [phone] Wanda Plump [phone]  [place]  [address]  [place] [address]  Phone: [phone] Fax: [phone]    [facility], [place]  [place] [address]  Phone: [phone] Fax: [phone]  Medication:   Requested Prescriptions     Pending Prescriptions Disp Refills   ??? montelukast (SINGULAIR) 10 MG tablet [Pharmacy Med Name: MONTELUKAST SOD 10 MG TABLET] 7 tablet 0     Sig: TAKE ONE TABLET BY MOUTH DAILY      Last Filled:      Patient Phone Number: [phone] (home)     Last appt: 02/07/2020    Next appt: Visit date not found    Last OARRS:   RX Monitoring 10/06/2018   Attestation -   Periodic Controlled Substance Monitoring No signs of potential drug abuse or diversion identified.;Possible medication side effects, risk of tolerance/dependence & alternative treatments discussed.;Assessed functional status.;Obtaining appropriate analgesic effect of treatment.   Chronic Pain > 80 MEDD -     PDMP Monitoring:    Last PDMP Elta Guadeloupe as Reviewed [facility]):  Review User Review Instant Review Result   Truett Mainland 01/03/2020 11:13 AM Reviewed PDMP [1]     Preferred Pharmacy:   [facility] [address], [place] [phone] Wanda Plump [phone]  [address]  [place] [address]  Phone: [phone] Fax: [phone]    [place], [place], [address] - P [phone] - F [phone]  [address]  [place] [address]  Phone: [phone] Fax: [phone]    [facility] [place], [place] [phone]  [place]  [address]  [place] [address]  Phone: [phone] Fax: [phone]    [place], [place]  P [phone] Wanda Plump [phone]  [address]  [place] [address]  Phone: [phone] Fax: [phone]

## 2020-05-15 ENCOUNTER — Inpatient Hospital Stay: Payer: PRIVATE HEALTH INSURANCE | Primary: Family

## 2020-05-15 ENCOUNTER — Inpatient Hospital Stay: Admit: 2020-05-15 | Payer: PRIVATE HEALTH INSURANCE | Primary: Family

## 2020-05-15 ENCOUNTER — Ambulatory Visit: Admit: 2020-05-15 | Discharge: 2020-05-15 | Payer: PRIVATE HEALTH INSURANCE | Attending: Family | Primary: Family

## 2020-05-15 DIAGNOSIS — M25511 Pain in right shoulder: Secondary | ICD-10-CM

## 2020-05-15 MED ORDER — DICLOFENAC SODIUM 1 % EX GEL
1 | Freq: Two times a day (BID) | CUTANEOUS | 0 refills | Status: DC
Start: 2020-05-15 — End: 2020-05-26

## 2020-05-17 ENCOUNTER — Ambulatory Visit
Admit: 2020-05-17 | Discharge: 2020-05-17 | Payer: PRIVATE HEALTH INSURANCE | Attending: Plastic Surgery within the Head & Neck | Primary: Family

## 2020-05-17 DIAGNOSIS — H608X3 Other otitis externa, bilateral: Secondary | ICD-10-CM

## 2020-05-17 MED ORDER — TRIAMCINOLONE ACETONIDE 0.1 % EX CREA
0.1 % | CUTANEOUS | 1 refills | Status: DC
Start: 2020-05-17 — End: 2020-06-17

## 2020-05-18 ENCOUNTER — Encounter

## 2020-05-20 MED ORDER — ALPRAZOLAM 0.25 MG PO TABS
0.25 MG | ORAL_TABLET | ORAL | 0 refills | Status: AC
Start: 2020-05-20 — End: 2020-07-20

## 2020-05-20 NOTE — Telephone Encounter (Signed)
Medication:   Requested Prescriptions     Pending Prescriptions Disp Refills   ??? ALPRAZolam (XANAX) 0.25 MG tablet [Pharmacy Med Name: ALPRAZolam 0.25 MG TABLET] 60 tablet 0     Sig: TAKE ONE TABLET BY MOUTH TWICE A DAY      Last Filled:     Patient Phone Number: [phone] (home)     Last appt: 05/15/2020   Next appt: Visit date not found    Last OARRS:   RX Monitoring 10/06/2018   Attestation -   Periodic Controlled Substance Monitoring No signs of potential drug abuse or diversion identified.;Possible medication side effects, risk of tolerance/dependence & alternative treatments discussed.;Assessed functional status.;Obtaining appropriate analgesic effect of treatment.   Chronic Pain > 80 MEDD -     PDMP Monitoring:    Last PDMP Elta Guadeloupe as Reviewed [facility]):  Review User Review Instant Review Result   Truett Mainland 01/03/2020 11:13 AM Reviewed PDMP [1]     Preferred Pharmacy:   [facility] [address] [place] [phone] Wanda Plump [phone]  [address]  [place] [address]  Phone: [phone] Fax: [phone]    [place], [place], [address] - P [phone] - F [phone]  [address]  [place] [address]  Phone: [phone] Fax: [phone]    [facility] [place], [place] - [place] [phone]  [place]  [address]  [place] [address]  Phone: [phone] Fax: [phone]    [facility], [place]  [place]  [place] [address]  Phone: [phone] Fax: [phone]

## 2020-05-24 ENCOUNTER — Encounter

## 2020-05-24 NOTE — Telephone Encounter (Signed)
Medication:   Requested Prescriptions     Pending Prescriptions Disp Refills   ??? diclofenac sodium (VOLTAREN) 1 % GEL [Pharmacy Med Name: DICLOFENAC SODIUM 1% GEL]  0     Sig: APPLY 4 GRAMS TO AFFECTED AREA(S) TWO TIMES A DAY      Last Filled:     Patient Phone Number: [phone] (home)     Last appt: 05/15/2020   Next appt: Visit date not found    Last OARRS:   RX Monitoring 10/06/2018   Attestation -   Periodic Controlled Substance Monitoring No signs of potential drug abuse or diversion identified.;Possible medication side effects, risk of tolerance/dependence & alternative treatments discussed.;Assessed functional status.;Obtaining appropriate analgesic effect of treatment.   Chronic Pain > 80 MEDD -     PDMP Monitoring:    Last PDMP Elta Guadeloupe as Reviewed [facility]):  Review User Review Instant Review Result   Truett Mainland 01/03/2020 11:13 AM Reviewed PDMP [1]     Preferred Pharmacy:   [facility] [address] [place] [phone] Wanda Plump [phone]  [address]  [place] [address]  Phone: [phone] Fax: [phone]    [place], [place], [address] - P [phone] - F [phone]  [address]  [place] [address]  Phone: [phone] Fax: [phone]    [facility] [place], [place] [phone]  [place]  [address]  [place] [address]  Phone: [phone] Fax: [phone]    [facility], [place]  [place]  [place] [address]  Phone: [phone] Fax: [phone]

## 2020-05-26 MED ORDER — DICLOFENAC SODIUM 1 % EX GEL
1 % | CUTANEOUS | 1 refills | Status: DC
Start: 2020-05-26 — End: 2020-12-20

## 2020-06-11 ENCOUNTER — Encounter

## 2020-06-11 MED ORDER — MONTELUKAST SODIUM 10 MG PO TABS
10 MG | ORAL_TABLET | ORAL | 0 refills | Status: DC
Start: 2020-06-11 — End: 2020-07-08

## 2020-06-11 NOTE — Telephone Encounter (Signed)
Medication:   Pending Prescriptions Disp Refills   ??? montelukast (SINGULAIR) 10 MG tablet [Pharmacy Med Name: MONTELUKAST SOD 10 MG TABLET] 23 tablet 0     Sig: TAKE ONE TABLET BY MOUTH DAILY        Patient Phone Number: [phone] (home)     Last appt: 05/15/2020   Next appt: Visit date not found    Last OARRS:   RX Monitoring 10/06/2018   Attestation -   Periodic Controlled Substance Monitoring No signs of potential drug abuse or diversion identified.;Possible medication side effects, risk of tolerance/dependence & alternative treatments discussed.;Assessed functional status.;Obtaining appropriate analgesic effect of treatment.   Chronic Pain > 80 MEDD -     Last PDMP Elta Guadeloupe as Reviewed [facility]):  Review User Review Instant Review Result   Truett Mainland 01/03/2020 11:13 AM Reviewed PDMP [1]     Preferred Pharmacy:   [facility] [address] [place] [phone] - F [phone]

## 2020-06-13 NOTE — Telephone Encounter (Signed)
Patient scheduled for 06/17/20 at 3pm

## 2020-06-13 NOTE — Telephone Encounter (Signed)
Dr [name] patient  Pt c/b [phone]  Pt stated  Has a skin tag that just popped up on her eye lid that is raised. Pt says it's been there for a couple days. 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 Pt wanted to know could she get in soon to have looked at she's ava anytime next week afternoon.  Please c/b to discuss

## 2020-06-17 MED ORDER — TRIAMCINOLONE ACETONIDE 0.1 % EX CREA
0.1 % | CUTANEOUS | 0 refills | Status: DC
Start: 2020-06-17 — End: 2021-02-03

## 2020-06-17 NOTE — Telephone Encounter (Signed)
Refill Request   Last seen: 05/17/20   Next scheduled appt: None

## 2020-06-19 ENCOUNTER — Ambulatory Visit: Admit: 2020-06-19 | Discharge: 2020-06-19 | Payer: PRIVATE HEALTH INSURANCE | Attending: Dermatology | Primary: Family

## 2020-06-19 DIAGNOSIS — L918 Other hypertrophic disorders of the skin: Secondary | ICD-10-CM

## 2020-06-23 ENCOUNTER — Encounter

## 2020-06-24 MED ORDER — MECLIZINE HCL 25 MG PO TABS
25 MG | ORAL_TABLET | ORAL | 0 refills | Status: DC
Start: 2020-06-24 — End: 2020-09-24

## 2020-06-24 NOTE — Telephone Encounter (Signed)
Medication:   Requested Prescriptions     Pending Prescriptions Disp Refills   ??? meclizine (ANTIVERT) 25 MG tablet [Pharmacy Med Name: MECLIZINE 25 MG TABLET] 15 tablet 0     Sig: TAKE ONE TABLET BY MOUTH THREE TIMES A DAY AS NEEDED FOR DIZZINESS          Patient Phone Number: [phone] (home)     Last appt: 05/15/2020   Next appt: Visit date not found    Last OARRS:   RX Monitoring 10/06/2018   Attestation -   Periodic Controlled Substance Monitoring No signs of potential drug abuse or diversion identified.;Possible medication side effects, risk of tolerance/dependence & alternative treatments discussed.;Assessed functional status.;Obtaining appropriate analgesic effect of treatment.   Chronic Pain > 80 MEDD -     PDMP Monitoring:    Last PDMP Elta Guadeloupe as Reviewed [facility]):  Review User Review Instant Review Result   Truett Mainland 01/03/2020 11:13 AM Reviewed PDMP [1]     Preferred Pharmacy:   [facility] [address], [place] [phone] Wanda Plump [phone]  [address]  [place] [address]  Phone: [phone] Fax: [phone]

## 2020-07-01 MED ORDER — FLUOCINONIDE 0.05 % EX SOLN
0.05 % | CUTANEOUS | 1 refills | Status: DC
Start: 2020-07-01 — End: 2020-12-18

## 2020-07-07 ENCOUNTER — Encounter

## 2020-07-08 MED ORDER — MONTELUKAST SODIUM 10 MG PO TABS
10 MG | ORAL_TABLET | ORAL | 0 refills | Status: DC
Start: 2020-07-08 — End: 2020-08-05

## 2020-07-08 NOTE — Telephone Encounter (Signed)
Medication:   Pending Prescriptions Disp Refills   ??? montelukast (SINGULAIR) 10 MG tablet 23 tablet 0      Last Filled:  06/11/20    Patient Phone Number: [phone] (home)     Last appt: 05/15/2020   Next appt: Visit date not found    Last PDMP Elta Guadeloupe as Reviewed [facility]):  Review User Review Instant Review Result   Truett Mainland 01/03/2020 11:13 AM Reviewed PDMP [1]     Preferred Pharmacy:   [facility] [address] [place] [phone] - F [phone]

## 2020-07-09 NOTE — Telephone Encounter (Signed)
Garvin called and asked if pt Singulair could be pushed up to 30 tabs from 23 tabs so insurance will cover the Rx. I looked back in the pt chart and it has been filled for 30 tabs in the passed and 90 tabs. I did go ahead and approve the 30 tabs to be filled. I spoke with Baxter Flattery.

## 2020-07-17 ENCOUNTER — Inpatient Hospital Stay: Admit: 2020-07-17 | Payer: PRIVATE HEALTH INSURANCE | Primary: Family

## 2020-07-17 ENCOUNTER — Encounter

## 2020-07-17 DIAGNOSIS — Z1231 Encounter for screening mammogram for malignant neoplasm of breast: Secondary | ICD-10-CM

## 2020-07-29 ENCOUNTER — Encounter

## 2020-08-05 ENCOUNTER — Encounter

## 2020-08-05 MED ORDER — MONTELUKAST SODIUM 10 MG PO TABS
10 MG | ORAL_TABLET | ORAL | 1 refills | Status: DC
Start: 2020-08-05 — End: 2020-10-02

## 2020-08-05 NOTE — Telephone Encounter (Signed)
Medication:   Requested Prescriptions     Pending Prescriptions Disp Refills   ??? montelukast (SINGULAIR) 10 MG tablet [Pharmacy Med Name: MONTELUKAST SOD 10 MG TABLET] 30 tablet      Sig: TAKE ONE TABLET BY MOUTH DAILY      Last Filled:      Patient Phone Number: [phone] (home)     Last appt: 05/15/2020   Next appt: Visit date not found    Last OARRS:   RX Monitoring 10/06/2018   Attestation -   Periodic Controlled Substance Monitoring No signs of potential drug abuse or diversion identified.;Possible medication side effects, risk of tolerance/dependence & alternative treatments discussed.;Assessed functional status.;Obtaining appropriate analgesic effect of treatment.   Chronic Pain > 80 MEDD -     PDMP Monitoring:    Last PDMP Elta Guadeloupe as Reviewed [facility]):  Review User Review Instant Review Result   Truett Mainland 01/03/2020 11:13 AM Reviewed PDMP [1]     Preferred Pharmacy:   [facility] [address], [place] [phone] Wanda Plump [phone]  [address]  [place] [address]  Phone: [phone] Fax: [phone]

## 2020-08-06 ENCOUNTER — Encounter

## 2020-08-06 MED ORDER — OMEPRAZOLE 40 MG PO CPDR
40 MG | ORAL_CAPSULE | ORAL | 2 refills | Status: DC
Start: 2020-08-06 — End: 2020-10-28

## 2020-08-06 NOTE — Telephone Encounter (Signed)
omeprazole (PRILOSEC) 40 MG delayed release capsule [[id number]    [facility] [address] [place] - [address] - P [phone] - F [phone]   [address] [place] [address]   Phone:  [phone] ??Fax:  [phone]

## 2020-08-06 NOTE — Telephone Encounter (Signed)
Medication:   Requested Prescriptions     Pending Prescriptions Disp Refills   ??? omeprazole (PRILOSEC) 40 MG delayed release capsule 30 capsule 2     Sig: TAKE ONE CAPSULE BY MOUTH DAILY      Last Filled:      Patient Phone Number: [phone] (home)     Last appt: 05/15/2020   Next appt: Visit date not found    Last OARRS:   RX Monitoring 10/06/2018   Attestation -   Periodic Controlled Substance Monitoring No signs of potential drug abuse or diversion identified.;Possible medication side effects, risk of tolerance/dependence & alternative treatments discussed.;Assessed functional status.;Obtaining appropriate analgesic effect of treatment.   Chronic Pain > 80 MEDD -     PDMP Monitoring:    Last PDMP Elta Guadeloupe as Reviewed [facility]):  Review User Review Instant Review Result   Truett Mainland 01/03/2020 11:13 AM Reviewed PDMP [1]     Preferred Pharmacy:   [facility] [address], [place] [phone] Wanda Plump [phone]  [address]  [place] [address]  Phone: [phone] Fax: [phone]

## 2020-08-12 ENCOUNTER — Encounter

## 2020-08-13 MED ORDER — VITAMIN D3 50 MCG (2000 UT) PO CAPS
50 MCG (2000 UT) | ORAL_CAPSULE | Freq: Every day | ORAL | 3 refills | Status: AC
Start: 2020-08-13 — End: ?

## 2020-08-13 NOTE — Telephone Encounter (Signed)
No new appt scheduled  Lab 10/17/19  Last visit 07/26/19

## 2020-08-13 NOTE — Telephone Encounter (Signed)
Patient needs a refill on her Vitamin D 50mcg        [facility] in chart

## 2020-08-29 ENCOUNTER — Ambulatory Visit
Admit: 2020-08-29 | Discharge: 2020-08-29 | Payer: PRIVATE HEALTH INSURANCE | Attending: Family Medicine | Primary: Family

## 2020-08-29 DIAGNOSIS — M5416 Radiculopathy, lumbar region: Secondary | ICD-10-CM

## 2020-08-29 MED ORDER — PREDNISONE 20 MG PO TABS
20 MG | ORAL_TABLET | Freq: Every day | ORAL | 0 refills | Status: AC
Start: 2020-08-29 — End: 2020-09-03

## 2020-09-10 NOTE — Telephone Encounter (Signed)
Pt advise

## 2020-09-10 NOTE — Telephone Encounter (Signed)
Received notice from patient's insurance company that they aren't going to cover MRI unless she finishes the complete 6 wk's of Physical therapy.  Once she is finished, she should follow up with Neuro to see if they can get MRI approved.  Per Dr. Leandro Reasoner.    Left message to call

## 2020-09-22 ENCOUNTER — Encounter

## 2020-09-24 MED ORDER — MECLIZINE HCL 25 MG PO TABS
25 MG | ORAL_TABLET | ORAL | 0 refills | Status: DC
Start: 2020-09-24 — End: 2021-12-26

## 2020-09-24 NOTE — Telephone Encounter (Signed)
Medication:   Requested Prescriptions     Pending Prescriptions Disp Refills   ??? meclizine (ANTIVERT) 25 MG tablet [Pharmacy Med Name: MECLIZINE 25 MG TABLET] 15 tablet 0     Sig: TAKE ONE TABLET BY MOUTH THREE TIMES A DAY AS NEEDED FOR DIZZINESS        Last Filled: 06/24/2020    Patient Phone Number: [phone] (home)     Last appt: 08/29/2020   Next appt: Visit date not found             Last OARRS:   RX Monitoring 10/06/2018   Attestation -   Periodic Controlled Substance Monitoring No signs of potential drug abuse or diversion identified.;Possible medication side effects, risk of tolerance/dependence & alternative treatments discussed.;Assessed functional status.;Obtaining appropriate analgesic effect of treatment.   Chronic Pain > 80 MEDD -

## 2020-09-27 NOTE — Telephone Encounter (Signed)
discontinued on 04/03/2020 by Francisca December, MD

## 2020-10-02 ENCOUNTER — Encounter

## 2020-10-02 MED ORDER — MONTELUKAST SODIUM 10 MG PO TABS
10 MG | ORAL_TABLET | ORAL | 0 refills | Status: DC
Start: 2020-10-02 — End: 2020-10-28

## 2020-10-02 NOTE — Telephone Encounter (Signed)
Medication:   Requested Prescriptions     Pending Prescriptions Disp Refills   ??? montelukast (SINGULAIR) 10 MG tablet 30 tablet 0     Sig: TAKE ONE TABLET BY MOUTH DAILY *Patient needs an appointment for further refills*        Last Filled: 08/05/20      Patient Phone Number: [phone] (home)     Last appt: 08/29/2020   Next appt: Visit date not found    Last OARRS:   RX Monitoring 10/06/2018   Attestation -   Periodic Controlled Substance Monitoring No signs of potential drug abuse or diversion identified.;Possible medication side effects, risk of tolerance/dependence & alternative treatments discussed.;Assessed functional status.;Obtaining appropriate analgesic effect of treatment.   Chronic Pain > 80 MEDD -

## 2020-10-09 ENCOUNTER — Ambulatory Visit: Admit: 2020-10-09 | Discharge: 2020-10-09 | Payer: PRIVATE HEALTH INSURANCE | Attending: Dermatology | Primary: Family

## 2020-10-09 DIAGNOSIS — L821 Other seborrheic keratosis: Secondary | ICD-10-CM

## 2020-10-27 ENCOUNTER — Encounter

## 2020-10-28 MED ORDER — MONTELUKAST SODIUM 10 MG PO TABS
10 MG | ORAL_TABLET | ORAL | 0 refills | Status: DC
Start: 2020-10-28 — End: 2020-11-26

## 2020-10-28 MED ORDER — OMEPRAZOLE 40 MG PO CPDR
40 MG | ORAL_CAPSULE | ORAL | 1 refills | Status: DC
Start: 2020-10-28 — End: 2020-11-26

## 2020-10-28 NOTE — Telephone Encounter (Signed)
Medication:   Requested Prescriptions     Pending Prescriptions Disp Refills   ??? montelukast (SINGULAIR) 10 MG tablet [Pharmacy Med Name: MONTELUKAST SOD 10 MG TABLET] 30 tablet 0     Sig: TAKE ONE TABLET BY MOUTH DAILY. PATIENT NEEDS AN APPOINTMENT FOR FURTHER REFILLS   ??? omeprazole (PRILOSEC) 40 MG delayed release capsule [Pharmacy Med Name: OMEPRAZOLE DR 40 MG CAPSULE] 27 capsule      Sig: TAKE ONE CAPSULE BY MOUTH DAILY        Last Filled:      Patient Phone Number: [phone] (home)     Last appt: 08/29/2020   Next appt: Visit date not found    Last OARRS:   RX Monitoring 10/06/2018   Attestation -   Periodic Controlled Substance Monitoring No signs of potential drug abuse or diversion identified.;Possible medication side effects, risk of tolerance/dependence & alternative treatments discussed.;Assessed functional status.;Obtaining appropriate analgesic effect of treatment.   Chronic Pain > 80 MEDD -

## 2020-11-01 ENCOUNTER — Encounter

## 2020-11-04 NOTE — Telephone Encounter (Signed)
Medication:   Requested Prescriptions     Pending Prescriptions Disp Refills   ??? ondansetron (ZOFRAN) 4 MG tablet [Pharmacy Med Name: ONDANSETRON HCL 4 MG TABLET] 30 tablet 0     Sig: TAKE ONE TABLET BY MOUTH THREE TIMES A DAY AS NEEDED FOR NAUSEA AND VOMITING        Last Filled: 11/01/2019      Patient Phone Number: [phone] (home)     Last appt: 11/01/2019    Next appt: Visit date not found    Last OARRS:   RX Monitoring 10/06/2018   Attestation -   Periodic Controlled Substance Monitoring No signs of potential drug abuse or diversion identified.;Possible medication side effects, risk of tolerance/dependence & alternative treatments discussed.;Assessed functional status.;Obtaining appropriate analgesic effect of treatment.   Chronic Pain > 80 MEDD -

## 2020-11-26 ENCOUNTER — Encounter

## 2020-11-26 MED ORDER — MONTELUKAST SODIUM 10 MG PO TABS
10 MG | ORAL_TABLET | ORAL | 0 refills | Status: DC
Start: 2020-11-26 — End: 2021-01-31

## 2020-11-26 MED ORDER — OMEPRAZOLE 40 MG PO CPDR
40 MG | ORAL_CAPSULE | ORAL | 1 refills | Status: DC
Start: 2020-11-26 — End: 2021-01-31

## 2020-11-26 NOTE — Telephone Encounter (Signed)
Medication:   Requested Prescriptions     Pending Prescriptions Disp Refills   ??? montelukast (SINGULAIR) 10 MG tablet [Pharmacy Med Name: MONTELUKAST SOD 10 MG TABLET] 30 tablet 0     Sig: TAKE ONE TABLET BY MOUTH DAILY. NEED TO MAKE APPOINTMENT WITH DOCTOR   ??? omeprazole (PRILOSEC) 40 MG delayed release capsule [Pharmacy Med Name: OMEPRAZOLE DR 40 MG CAPSULE] 27 capsule 1     Sig: TAKE ONE CAPSULE BY MOUTH DAILY      Last Filled:      Patient Phone Number: [phone] (home)     Last appt: 08/29/2020   Next appt: Visit date not found    Last OARRS:   RX Monitoring 10/06/2018   Attestation -   Periodic Controlled Substance Monitoring No signs of potential drug abuse or diversion identified.;Possible medication side effects, risk of tolerance/dependence & alternative treatments discussed.;Assessed functional status.;Obtaining appropriate analgesic effect of treatment.   Chronic Pain > 80 MEDD -     PDMP Monitoring:    Last PDMP Elta Guadeloupe as Reviewed [facility]):  Review User Review Instant Review Result   Truett Mainland 01/03/2020 11:13 AM Reviewed PDMP [1]     Preferred Pharmacy:   [facility] [address] [place] [phone] Wanda Plump [phone]  [address]  [place] [address]  Phone: [phone] Fax: [phone]

## 2020-12-11 ENCOUNTER — Encounter

## 2020-12-11 MED ORDER — ONDANSETRON HCL 4 MG PO TABS
4 MG | ORAL_TABLET | ORAL | 0 refills | Status: AC
Start: 2020-12-11 — End: 2021-12-26

## 2020-12-11 NOTE — Telephone Encounter (Signed)
Medication:   Requested Prescriptions     Pending Prescriptions Disp Refills   ??? ondansetron (ZOFRAN) 4 MG tablet [Pharmacy Med Name: ONDANSETRON HCL 4 MG TABLET] 30 tablet 0     Sig: TAKE ONE TABLET BY MOUTH THREE TIMES A DAY AS NEEDED FOR NAUSEA AND VOMITING     Last Filled:  11/01/19    Last appt: 08/29/2020   Next appt: Visit date not found    Last Lipid:   Lab Results   Component Value Date    CHOL 150 08/09/2019    TRIG 134 08/09/2019    HDL 43 08/09/2019    HDL 52 03/31/2011    LDLCALC 80 08/09/2019

## 2020-12-18 MED ORDER — FLUOCINONIDE 0.05 % EX SOLN
0.05 % | CUTANEOUS | 1 refills | Status: DC
Start: 2020-12-18 — End: 2021-08-18

## 2020-12-20 ENCOUNTER — Encounter

## 2020-12-23 MED ORDER — DICLOFENAC SODIUM 1 % EX GEL
1 % | CUTANEOUS | 1 refills | Status: DC
Start: 2020-12-23 — End: 2023-04-06

## 2021-01-23 ENCOUNTER — Encounter

## 2021-01-31 ENCOUNTER — Encounter

## 2021-01-31 MED ORDER — MONTELUKAST SODIUM 10 MG PO TABS
10 MG | ORAL_TABLET | ORAL | 0 refills | Status: DC
Start: 2021-01-31 — End: 2021-03-17

## 2021-01-31 MED ORDER — OMEPRAZOLE 40 MG PO CPDR
40 MG | ORAL_CAPSULE | ORAL | 1 refills | Status: DC
Start: 2021-01-31 — End: 2021-03-06

## 2021-01-31 NOTE — Telephone Encounter (Signed)
Medication:   Requested Prescriptions     Pending Prescriptions Disp Refills   ??? montelukast (SINGULAIR) 10 MG tablet [Pharmacy Med Name: MONTELUKAST SOD 10 MG TABLET] 30 tablet 0     Sig: TAKE ONE TABLET BY MOUTH DAILY NEED TO MAKE APPOINTMENT WITH DOCTOR   ??? omeprazole (PRILOSEC) 40 MG delayed release capsule [Pharmacy Med Name: OMEPRAZOLE DR 40 MG CAPSULE] 27 capsule 1     Sig: TAKE ONE CAPSULE BY MOUTH DAILY      Last Filled:      Patient Phone Number: [phone] (home)     Last appt: 08/29/2020   Next appt: 02/05/2021    Last OARRS:   RX Monitoring 10/06/2018   Attestation -   Periodic Controlled Substance Monitoring No signs of potential drug abuse or diversion identified.;Possible medication side effects, risk of tolerance/dependence & alternative treatments discussed.;Assessed functional status.;Obtaining appropriate analgesic effect of treatment.   Chronic Pain > 80 MEDD -     PDMP Monitoring:    Last PDMP Elta Guadeloupe as Reviewed [facility]):  Review User Review Instant Review Result   Truett Mainland 01/03/2020 11:13 AM Reviewed PDMP [1]     Preferred Pharmacy:   [facility] [address], [place] [phone] Wanda Plump [phone]  [address]  [place] [address]  Phone: [phone] Fax: [phone]

## 2021-02-03 MED ORDER — TRIAMCINOLONE ACETONIDE 0.1 % EX CREA
0.1 % | CUTANEOUS | 0 refills | Status: AC
Start: 2021-02-03 — End: 2022-04-13

## 2021-02-05 ENCOUNTER — Encounter: Payer: PRIVATE HEALTH INSURANCE | Attending: Family | Primary: Family

## 2021-02-05 ENCOUNTER — Encounter

## 2021-02-06 NOTE — Telephone Encounter (Signed)
-----   Message from Danise Mina sent at 02/06/2021  4:16 PM EST -----  Subject: Message to Provider    QUESTIONS  Information for Provider? patient would like to office manager to them a   call about concerning a visit on 02/05/2021 , is a patient of provider   Wojciechowski  ---------------------------------------------------------------------------  --------------  [place]  What is the best way for the office to contact you? OK to leave message on   voicemail  Preferred Call Back Phone Number? [phone]  ---------------------------------------------------------------------------  --------------  SCRIPT ANSWERS  Relationship to Patient? Self

## 2021-02-06 NOTE — Telephone Encounter (Signed)
Pt showed up 16 minutes late for her appt  on 3/2 and not sure what pt want to talk about.     Please see below.

## 2021-02-11 NOTE — Telephone Encounter (Signed)
Called pt, no answer.  LMOM for pt to call me back.

## 2021-02-12 ENCOUNTER — Encounter: Attending: Family | Primary: Family

## 2021-02-13 LAB — CBC WITH AUTO DIFFERENTIAL
Basophils Absolute: 0 10*3/uL (ref 0.00–0.20)
Basophils Absolute: 1 %
Eosinophils %: 5 %
Eosinophils Absolute: 0.3 10*3/uL (ref 0.03–0.45)
Hematocrit: 42.3 % (ref 36–46)
Hemoglobin: 14.1 g/dL (ref 12.0–15.2)
Lymphocytes %: 38 %
Lymphocytes Absolute: 2.6 10*3/uL (ref 1.00–4.00)
MCH: 27.8 pg (ref 27–33)
MCHC: 33.3 g/dL (ref 32–36)
MCV: 83.5 fL (ref 82–97)
MPV: 8.8 fL (ref 7.4–11.5)
Monocytes Absolute: 0.7 10*3/uL (ref 0.20–0.90)
Monocytes: 10 %
Neutrophils Absolute: 3.2 10*3/uL (ref 1.80–7.70)
Platelets: 170 10*3/uL (ref 140–375)
RBC: 5.06 10*6/uL (ref 3.80–5.20)
RDW: 14.2 % (ref 12.3–17.0)
Seg Neutrophils: 46 %
WBC: 6.8 10*3/uL (ref 3.6–10.5)

## 2021-02-13 LAB — COMPREHENSIVE METABOLIC PANEL
ALT: 47 IU/L (ref 10–60)
AST: 53 IU/L — ABNORMAL HIGH (ref 10–40)
Albumin: 4.2 g/dL (ref 3.5–5.7)
Alkaline Phosphatase: 71 IU/L (ref 35–135)
Anion Gap: 7 mmol/L (ref 6–18)
BUN: 20 mg/dL (ref 8–26)
CO2: 27 mmol/L (ref 21–31)
Calcium: 10 mg/dL (ref 8.5–10.4)
Chloride: 102 mEq/L (ref 98–111)
Creatinine: 1.08 mg/dL (ref 0.60–1.20)
Glucose: 119 mg/dL — ABNORMAL HIGH (ref 70–99)
Potassium: 4.6 mEq/L (ref 3.6–5.1)
Sodium: 136 mEq/L (ref 135–145)
Total Bilirubin: 0.7 mg/dL (ref 0.0–1.2)
Total Protein: 7.5 g/dL (ref 6.0–8.0)
eGFR (CKD-EPI): 58 mL/min/{1.73_m2} — ABNORMAL LOW (ref >59)

## 2021-02-13 LAB — URINALYSIS
Bilirubin, Urine: NEGATIVE
Epithelial Cells, UA: 0 /HPF
Erythrocytes, Urine: <6 /HPF (ref <6)
Glucose, Ur: NEGATIVE
Hyaline Casts, UA: 3 /LPF
Ketones, Urine: NEGATIVE
LEUKOCYTES, UA: 0 /HPF
Leukocyte Esterase, Urine: NEGATIVE
Nitrite, Urine: NEGATIVE
RBC, UA: NEGATIVE
Specific Gravity, UA: 1.025 (ref 1.005–1.029)
pH, Urine: 6.5 (ref 5.0–8.0)

## 2021-02-13 LAB — HEPATIC FUNCTION PANEL
ALT: 47 IU/L (ref 10–60)
AST: 53 IU/L — ABNORMAL HIGH (ref 10–40)
Albumin: 4.2 g/dL (ref 3.5–5.7)
Alkaline Phosphatase: 71 IU/L (ref 35–135)
Bilirubin, Direct: 0.2 mg/dL (ref 0.0–0.2)
Total Bilirubin: 0.7 mg/dL (ref 0.0–1.2)
Total Protein: 7.5 g/dL (ref 6.0–8.0)

## 2021-02-13 LAB — LIPID PANEL
Cholesterol non HDL: 101 mg/dL (ref <130)
Cholesterol, Total: 149 mg/dL (ref <200)
HDL: 48 mg/dL — ABNORMAL LOW (ref >50)
LDL Calculated: 78 mg/dL (ref <130)
Triglycerides: 116 mg/dL (ref <150)

## 2021-02-13 LAB — PROTIME-INR
INR: 1 (ref 0.8–1.2)
Protime: 12.8 Seconds (ref 11.7–14.2)

## 2021-02-13 LAB — APTT: aPTT: 27.8 Seconds (ref 23.6–34.0)

## 2021-02-14 LAB — HEMOGLOBIN A1C
Hemoglobin A1C: 7.1 % — ABNORMAL HIGH (ref 4.2–5.6)
eAG: 157 mg/dL

## 2021-02-14 LAB — ALBUMIN, RANDOM URINE
Creatinine, Ur: 241.1 mg/dL
Microalbumin Creatinine Ratio: 51 mg/g — ABNORMAL HIGH (ref 0–30)
Microalbumin, Random Urine: 122.9 mg/L

## 2021-02-14 LAB — VITAMIN B12: Vitamin B-12: 805 pg/mL (ref 232–1245)

## 2021-02-14 LAB — VITAMIN D 25 HYDROXY: Vit D, 25-Hydroxy: 47.1 ng/mL (ref 30.0–150.0)

## 2021-02-15 LAB — COVID-19: SARS-CoV-2: NOT DETECTED

## 2021-02-17 MED ORDER — GABAPENTIN 300 MG PO CAPS
300 MG | ORAL_CAPSULE | ORAL | 0 refills | Status: DC
Start: 2021-02-17 — End: 2021-03-19

## 2021-02-17 NOTE — Telephone Encounter (Signed)
Patient transferred to office and I spoke with patient. Patient is having stents placed tomorrow with cardiology. Not sure if symptoms are related to her vertigo or not. Did advise to call cardiology to make them aware and see what they would like for her to do. Did advise Urgent Care/ER as well. Unable to offer patient an appointment for today as we did not have any available. Patient states that she will call her Cardiologist to see what her next steps are. Did attempt to get blood pressure but her machine was not working.

## 2021-02-17 NOTE — Telephone Encounter (Signed)
Medication:   Requested Prescriptions     Pending Prescriptions Disp Refills   ??? gabapentin (NEURONTIN) 300 MG capsule [Pharmacy Med Name: GABAPENTIN 300 MG CAPSULE] 60 capsule 0     Sig: TAKE TWO CAPSULES BY MOUTH ONCE NIGHTLY        Last Filled: 04/08/2020      Patient Phone Number: [phone] (home)     Last appt: 08/29/2020   Next appt: 02/27/2021    Last OARRS:   RX Monitoring 10/06/2018   Attestation -   Periodic Controlled Substance Monitoring No signs of potential drug abuse or diversion identified.;Possible medication side effects, risk of tolerance/dependence & alternative treatments discussed.;Assessed functional status.;Obtaining appropriate analgesic effect of treatment.   Chronic Pain > 80 MEDD -

## 2021-02-17 NOTE — Telephone Encounter (Signed)
Received call from [facility]  at [facility] with Red Flag Complaint.    Subjective: Caller states "I started yesterday being lightheaded and dizzy.  Happens more with sitting or laying down."     Current Symptoms: Feels like the room is spinning/tilting.  Having stents put in heart tomorrow. History of vertigo. No assistance to walk.    Onset: 1 day ago; worsening    Associated Symptoms: reduced activity    Pain Severity: 0/10; N/A; none    Temperature: denies fever     What has been tried: laying down, drinking fluids    LMP: NA Pregnant: NA    Recommended disposition: Go to ED/UCC Now (Or to Office with PCP Approval)    Care advice provided, patient verbalizes understanding; denies any other questions or concerns; instructed to call back for any new or worsening symptoms.    Writer provided warm transfer to [place] at [place] for second level triage     Attention Provider:  Thank you for allowing me to participate in the care of your patient.  The patient was connected to triage in response to information provided to the ECC/PSC.  Please do not respond through this encounter as the response is not directed to a shared pool.    Reason for Disposition  ??? [1] Dizziness (vertigo) present now AND [2] one or more STROKE RISK FACTORS (i.e., hypertension, diabetes, prior stroke/TIA, heart attack)  (Exception: prior physician evaluation for this AND no different/worse than usual)    Protocols used: DIZZINESS - VERTIGO-ADULT-AH

## 2021-02-17 NOTE — Other (Unsigned)
[email or web address]  [place] [address]  Phone: [phone]          Discharge Summary    02/17/2021     SHAWNY BORKOWSKI    MRN: [id number]            Admission Information     Date & Time  02/17/2021 Department  [id number]      Allergies as of 02/19/2021        Reactions    Talwin [pentazocine Lactate] Other (See Comments)    hallucinations        Immunizations Administered for This Admission  Never Reviewed    No immunizations on file.      Reason for Admission (From admission, onward)        Admit From ED/Outpt/SDS/Triage (ADT109) Once-Routine ADT                         Hospital Problem Summary (Principal Diagnosis at discharge indicated by   diamond)             Problem List   Date Reviewed: 02/18/2021                  Class  Noted      * (Principal) Chest pain    09/23/2020      Abnormal cardiovascular stress test    01/31/2021      Angina, class III ([place])    01/31/2021      Anxiety and depression    07/15/2016      Backache    01/23/2009      Bilateral leg numbness    01/01/2021      CAD in native artery    01/31/2021      Chronic diastolic congestive heart failure ([place])    03/30/2016      Chronic pain disorder    05/05/2017      Dyslipidemia    12/25/2016      Essential hypertension    07/24/2013      Fibromyalgia    10/22/2008      Mixed hyperlipidemia    01/01/2021      Morbidly obese ([place])    02/18/2021      Obesity    10/22/2008      Obstructive sleep apnea    09/18/2015      Spondylosis without myelopathy or radiculopathy, lumbosacral region    10/22/2008      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               Durable Medical Equipment     No services have been selected for the patient.  Dialysis/Infusion     No services have been selected for the patient.               Home Medical Care     No services have been selected for the patient.               Community Resources     No services have been selected for the patient.                 bmk  Things you need to do     Schedule an appointment with Neal Dy, CNP as soon as possible   for a visit in 1 week(s)    Phone: [phone]    Where: [facility], [address], [facility] [address]    Schedule an appointment with Luane School, MD as soon as possible for a   visit in 2 week(s)    Phone: [phone]    Where: [facility], [address] [place]   [address]    Wednesday Feb 19, 2021    Amb Referral to Gastroenterology     Luane School, [place]      [facility]  [address]  [place] [address]        Monday Mar 10, 2021    OFFICE VISIT with Catheryn Bacon, MD at 10:30 AM    Bring photo ID  Bring insurance card  Bring list of medications  Bring co-pay  Arrive 15 minutes early  Bring Covid Vaccination Card      Where: [facility] ([place])    Go to Catheryn Bacon, MD    at 10:30 AM    Phone: [phone]    Where: [facility], [address],   [place] [facility] [address]    Wednesday Mar 26, 2021    OFFICE VISIT with Rosana Hoes, CNP at 2:00 PM    Bring photo ID  Bring insurance card  Bring list of medications  Bring co-pay  Arrive 15 minutes early  Bring Covid Vaccination Card      Where: [facility])    [address]  [place] [address]  [phone] [facility]  [place]  [place] [address]  [phone]         Medication  List      START taking these medications       Last dose Next dose Comments As Needed   ranolazine 500 MG Tb12  Commonly known as: RANEXA  Take 1 tablet by mouth 2 (two) times daily.              CONTINUE taking these medications       Last dose Next dose Comments As Needed   aspirin 81 MG Tbec  Take 81 mg by mouth daily.         Calcium Citrate-Vitamin D 315-250 MG-UNIT Tabs  Take 2 tablets by mouth 2 (two) times daily.         cetirizine 10 mg tablet  Commonly known as: ZYRTEC  Take 10 mg by mouth daily.         CoQ10 200 MG Caps  Take 200 mg by mouth daily.         diclofenac 1 % Gel  Commonly known as: VOLTAREN GEL  Apply 4 g topically 4 (four) times  daily as needed for Pain.         diphenhydrAMINE 25 MG Tabs  Commonly known as: BENADRYL  Take 50 mg by mouth nightly.         gabapentin 600 MG Tabs  Commonly known as: NEURONTIN  TAKE TWO TABLETS BY MOUTH ONCE NIGHTLY         lisinopril 40 MG Tabs  Commonly known as: PRINIVIL,ZESTRIL  Take 1 tablet by mouth daily.         meclizine 25 MG Tabs  Commonly known as: ANTIVERT  Take 25 mg by mouth 3 (three) times daily as needed (dizziness).         montelukast 10 MG Tabs  Commonly known as: SINGULAIR  Take 10 mg by mouth daily.         multivitamin Tabs  Take 1 tablet by mouth daily.         nitroGLYCERIN 0.4 MG Subl  Commonly known as: NITROSTAT  1 tablet by Sublingual route every 5 (five) minutes as needed.         omeprazole 40 MG Cpdr  Commonly known as: PRILOSEC  Take 40 mg by mouth daily.         ondansetron 4 mg tablet  Commonly known as: ZOFRAN  Take 4 mg by mouth 3 (three) times daily as needed.         oxycodone-acetaminophen 7.5-325 MG Tabs  Commonly known as: PERCOCET  Take 1 tablet by mouth every 6 (six) hours as needed (pain).         rosuvastatin 20 MG Tabs  Commonly known as: CRESTOR  Take 1 tablet by mouth daily.         spironolactone 25 MG Tabs  Commonly known as: ALDACTONE  Take 1.5 tablets by mouth daily.         torsemide 20 MG Tabs  Commonly  known as: DEMADEX  Take 1 tablet by mouth daily as needed.         triamcinolone 0.1% cream  Commonly known as: KENALOG  Apply topically 2 (two) times a day.         Vagisil Deodorant Powd  Apply topically 2 (two) times daily. To skin folds         vitamin D 1000 units (25 mcg) Tabs  Commonly known as: CHOLECALCIFEROL  Take 1,000 Units by mouth daily.                 Where to Get Your Medications      These medications were sent to [facility] [address] [place] - [address]  [address], [place] [address]    Phone: [phone]   ranolazine 500 MG Tb12               Patient Instructions       Have someone stay with you for 24 hours.    No driving or drinking alcohol for 24 hours.    Avoid bending wrist for 24 hours.    If active bleeding occurs, apply very firm pressure to site and call 911.    Remove armboard and dressing after 24 hours and leave open to air.    May shower after dressing is off.    No tub baths, hot tub or swimming (soaking arm in water) for four days.    No lifting greater than 5 lbs for four days (1/2 gallon of milk).    Drink plenty  of fluids for next 48 hours to wash dye through kidneys.     If excessive bruising, swelling, pain or signs of infection (redness,   drainage, warm to touch, fever) occur, call your doctor.      Patient Belongings    Flowsheet Row Most Recent Value   Patient Belongings at Bedside    Belongings at Bedside Cell phone, Jewelry, Civil Service fast streamer, Glass blower/designer Pants, Shirt, Sports administrator, Archivist, Socks, Underpants   Medical Equipment BiPAP   Patient Belongings Sent Home/Family    Belongings Sent Home/Family None   Patient Belongings Sent to Security    Belongings Sent to Security None         IF YOU ARE A SMOKER OR HAVE SMOKED IN THE LAST 12 MONTHS, WE ENCOURAGE YOU   TO EXPLORE OPTIONS FOR QUITTING. Refer to the literature given to you while in   the hospital for advice on how to quit.    For Pneumonia  Patients:  I understand that the pneumonia vaccine is recommended for people 39 and older   and people with chronic health conditions, once in a lifetime. It should be   repeated every 5-10 years if received before age 83. The flu vaccine should be   given every year for people 46 and older, younger for those with chronic   health conditions.    For Heart Failure/Cardiac Patients:  I understand:   - Regular activity within my limitations is important for my health and can   consult my doctor for suggestions on exercise.   - Eating a low fat, low cholesterol, & low sodium diet with plenty of fruits   and vegetables can reduce my chance of suffering a future heart attack.   - Weighing myself daily and reporting a gain of 2-3 pounds a day and/or 5-6   pounds a week to my physician is important.   - If any of my symptoms worsen, such as edema/swelling or difficulty   breathing, contact my doctor or go to the nearest emergency department.  - Be sure to keep all appointments with my doctor.    For Stroke Patients:  I understand that by carefully controlling and monitoring any of the risk   factors listed, I can decrease my risk of future stroke:   - High Blood Pressure (hypertension)   - High Blood Cholesterol (hyperlipidemia)  - Diabetes   - Smoking   - Alcohol Abuse   - Drug Abuse  If you are experiencing signs and symptoms of a stroke or transient ischemic   attack (TIA), which can include:  ?          Slurred speech, confusion or trouble understanding  ?          Severe headache  ?          Sudden difficulty with vision  ?          Sudden numbness or weakness of an extremity   ?          Loss of balance or coordination  ?          Fainting  ?          Seizure  CALL 911 IMMEDIATELY    If You Are Prescribed Opioids For Pain:  ?          Never take opioids in greater amounts or more often than prescribed.  ?  Follow up with your primary health care provider within 2 weeks.  ?          Work together to create a  plan on how to manage your pain.  ?          Talk about ways to help manage your pain that don't involve prescription   opioids.  ?          Talk about any and all concerns and side effects.  ?          Help prevent misuse and abuse.  ?          Never sell or share prescription opioids.  ?          Never use another person's prescription opioids.  ?          Store prescription opioids in a secure place and out of reach of others.  ?          Safely dispose of unused prescriptions: Find your community drug take-back   program or your pharmacy mail-back program, or flush them down the toilet,   following guidance from the Food and Drug Administration   (NotebookPreviews.si).  ?          Visit http://knight-sullivan.biz/ to learn more about the risks of opioid abuse   and overdose.  ?          If you believe you may be struggling with addiction, tell your health care   provider and ask for guidance or call [facility] at   1-800-662-HELP.    ------------------------------------------------------------------------------  ------------------------------------------------  We can all help to prevent suicide. If you or someone you know is in emotional   distress, please call the [facility] or the [facility]. Both of these numbers provide free, confidential support for people   who are in distress. They provide information regarding suicide prevention as   well as crisis resources for you or a loved one. Both services are available   24 hours a day, seven days a week.     1-800-273-TALK (8255)  513-281-CARE (2273)        Additional Information          Moderate Sedation Discharge Instructions   About this topic   Moderate sedation is also known as conscious sedation. It changes a person's   state of being awake. This is also called consciousness. With this, you only   feel slight pain during a minor procedure. You will be relaxed and may seem to   sleep. It will be  easy to wake you. You may talk and answer questions while   sedated. Most likely, you will not remember it once it is over.  Moderate sedation is given by someone with special training. This is someone   like a:  ?          Programmer, systems Art therapist)  ?          Anesthesiologist  ?          Doctor  ?          Dentist  ?          Oral surgeon  You may need conscious sedation if you are at the hospital or in the dentist's   office. Sometimes it is given at the doctor's office or an outpatient clinic.   The staff carefully watches you during and after the procedure.  Why is this  procedure done?   This type of sedation is most often given for:  ?          Breast tissue samples  ?          Colonoscopy  ?          Vasectomy  ?          Dental surgery like an implant or taking out an impacted tooth  ?          Minor foot surgery  ?          Endoscopy  ?          Fix a broken bone  ?          Plastic cosmetic surgery  ?          Endotracheal procedure  What happens after the procedure?   You may feel these side effects:  ?          Headache  ?          Upset stomach or throwing up  ?          Hangover feeling  ?          Short period of no memory  ?          Bad memories  What care is needed at home?   ?          Do not make major decisions or sign important papers for at least 24 hours.   You may not be thinking clearly.  ?          Do not drive or operate machinery for 24 hours or until OK'd by your doctor.  What problems could happen?   ?          Low blood pressure  ?          Breathing problems  Where can I learn more?   American Association of Nurse Anesthetists     [employer]     Last Reviewed Date   2014-12-24  Consumer Information Use and Disclaimer   This information is not specific medical advice and does not replace   information you receive from your health care provider. This is only a brief   summary of general information. It does NOT include all information about    conditions, illnesses, injuries, tests, procedures, treatments, therapies,   discharge instructions or life-style choices that may apply to you. You must   talk with your health care provider for complete information about your health   and treatment options. This information should not be used to decide whether   or not to accept your health care provider's advice, instructions or   recommendations. Only your health care provider has the knowledge and training   to provide advice that is right for you.  Copyright   Copyright ? 2017 [employer] Information, [employer]. and Nurse, adult. All rights reserved.         Grateful Patient/Family    If you were happy with the care you received and would like to honor your   physician, nurse or other care giver, email the [employer] and   request a Grateful Patient Program Brochure at  .       Additional Information    General Recommendations from the CDC About Antibiotic Use for Everyone to   Know:  You might not need antibiotics to help you  get better from many common   infections or illnesses. Antibiotics only treat infections caused by bacteria,   but some illnesses are caused by a virus. Viruses can cause colds, runny   nose, bronchitis or chest cold, influenza, sore throat, or earaches. To learn   more about when antibiotics are the right choice, visit the Center for Disease   Control website: [place]InsuranceAgents.ch         I have received and reviewed my discharge instructions, had them explained to   me and understand them. I have also been given an opportunity to ask questions   about them and have had all of my questions answered to my satisfaction.     Patient Signature:      ______________________________    Date:     _______________                Responsible Party (Printed):     ______________________________    Responsible Party Signature:     ______________________________             MyChart Activation Instructions     February 19, 2021     XIMENNA FONSECA  [address]  [place] [address]    Dear Rinaldo Cloud:    Thank you for enrolling in MyChart. Please follow the instructions below to   securely access your online medical record. MyChart allows you to send   messages to your doctor, view your test results, renew your prescriptions,   request appointments, and more.    How Do I Sign Up?  In your Internet browser, go to [email or web address]  Click on the Sign Up Now link in the Sign In box. You will see the New Member   Sign Up page.  Enter your MyChart Access Code exactly as it appears below. You will not need   to use this code after you've completed the sign-up process. If you do not   sign up before the expiration date, you must request a new code.     MyChart Access Code: Activation code not generated  Current MyChart Status: Active    Enter your last 4 digits of your Social Security Number and Date of Birth   (mm/dd/yyyy) as indicated and click Submit. You will be taken to the next   sign-up page.  Create a MyChart ID. This will be your MyChart login ID and cannot be changed,   so think of one that is secure and easy to remember.  Create a Clinical biochemist. You can change your password at any time.  Enter your Password Reset Question and Answer. This can be used at a later   time if you forget your password.   Enter your e-mail address. You will receive e-mail notification when new   information is available in MyChart.  Click Sign Up. You can now view your medical record.     Additional Information  If you have questions, you can email [email or web address]. Remember, MyChart   is NOT to be used for urgent needs. For medical emergencies, dial 911.

## 2021-02-18 NOTE — Unmapped (Addendum)
Cardiology Discharge Summary [facility]      Patient: Angela Yoder MRN: [id number] Room/Bed:  W2956/21 Date of Birth: 03/24/1956 Age: 65 year old   Gender: female      Admit Date:  02/17/2021 Discharge Date:  02/18/2021      Primary Care Physician  Murray Hodgkins, CNP Attending Physician  Mardene Sayer*      Code Status:  Full Code      Admission Diagnosis: No admission diagnoses are documented for this encounter.      Discharge Diagnoses: Principal Problem:   Chest pain POA: Yes Active Problems:   Chronic diastolic congestive heart failure (HCC) POA: Yes   Chronic pain disorder POA: Yes   Fibromyalgia POA: Yes   Essential hypertension POA: Yes   Obstructive sleep   apnea POA: Yes   Mixed hyperlipidemia POA: Yes   Abnormal cardiovascular stress test POA: Unknown   Angina, class III (HCC) POA: Yes   CAD in native artery POA: Yes   Morbidly obese (HCC) POA: Yes Resolved Problems:   * No resolved hospital problems. [facility] Course:  The patient was admitted for a scheduled LHC due to abnormal stress test and unstable angina. The LHC showed no obstructive CAD and patent LAD stent. The patient remained stable throughout the procedure and was admitted to telemetry   for overnight observation as she did not have anyone to monitor her overnight.They were started on ranolazine for her symptoms of angina. An ambulatory referral to gastroenterology was also placed due to chest and possible epigastric pain. The patient is   hemodynamically stable and up ambulating. The patient denies chest pain, SOB, palpitations, and syncope. The patient was discharged in stable condition.      Discharge Medications:      Medication List      START taking these medications ranolazine 500 MG Tb12 Commonly known as: RANEXA Take 1 tablet by mouth 2 (two) times daily.      CONTINUE taking these medications aspirin 81 MG Tbec      Calcium Citrate-Vitamin D 315-250 MG-UNIT Tabs      cetirizine 10 mg  tablet Commonly known as: ZYRTEC      CoQ10 200 MG Caps      diclofenac 1 % Gel Commonly known as: VOLTAREN GEL      diphenhydrAMINE 25 MG Tabs Commonly known as: BENADRYL      gabapentin 600 MG Tabs Commonly known as: NEURONTIN TAKE TWO TABLETS BY MOUTH ONCE NIGHTLY      lisinopril 40 MG Tabs Commonly known as: PRINIVIL,ZESTRIL Take 1 tablet by mouth daily.      meclizine 25 MG Tabs Commonly known as: ANTIVERT      montelukast 10 MG Tabs Commonly known as: SINGULAIR      multivitamin Tabs      nitroGLYCERIN 0.4 MG Subl Commonly known as: NITROSTAT 1 tablet by Sublingual route every 5 (five) minutes as needed.      omeprazole 40 MG Cpdr Commonly known as: PRILOSEC      ondansetron 4 mg tablet Commonly known as: ZOFRAN      oxycodone-acetaminophen 7.5-325 MG Tabs Commonly known as: PERCOCET      rosuvastatin 20 MG Tabs Commonly known as: CRESTOR Take 1 tablet by mouth daily.      spironolactone 25 MG Tabs Commonly known as: ALDACTONE Take 1.5 tablets by mouth daily.      torsemide 20 MG Tabs Commonly known as: DEMADEX  Take 1 tablet by mouth daily as needed.      triamcinolone 0.1% cream Commonly known as: KENALOG      Vagisil Deodorant Powd      vitamin D 1000 units (25 mcg) Tabs Commonly known as: CHOLECALCIFEROL           Where to Get Your Medications      These medications were sent to [facility] [address] [place] - [address] AT [facility] [address]  [address], [place] [address]  Phone: [phone]   ranolazine 500 MG Tb12      Cardiac Discharge Medications: Antiplatelet: N/A Anticoagulation: N/A ACEi/ARB/Entresto: YES Beta-Blocker:  No,  Hypotension Diuretics: N/A Lipid-lowering Agent/Statins: Yes Aspirin: Yes Smoking Cessation: N/A      Medication issues: None      Significant Labs: None      Discharge Pain Management Plan: Patient??????s discharge pain status is pain is adequately controlled.  Discharge plan for pain control discussed with patient. Discharge pain management includes  non-pharmacological pain reduction interventions, medications   to be taken as instructed, and to follow up with PCP for any changes.      Discharge Follow up/ Next Steps: Murray Hodgkins, CNP [facility] [address] [place] [facility] [address] [phone]      Schedule an appointment as soon as possible for a visit in 1 week(s)      Beryl Meager, MD TPP Gastroenterology [address] [place] [address] [phone]      Schedule an appointment as soon as possible for a visit in 2 week(s)           Consults: IP CONSULT TO CARDIOLOGY IP CONSULT TO SOCIAL WORK IP CONSULT TO PHYSICAL THERAPY IP CONSULT TO OCCUPATIONAL THERAPY      Procedures: XR CHEST AP PORTABLE      Result Date: 02/17/2021 Subtle bilateral airspace disease. Leading considerations include cardiogenic or noncardiogenic pulmonary edema and/or pneumonia, including possible COVID-19 pneumonia      CT HEAD WO CONTRAST      Result Date: 02/17/2021 Normal head CT      Procedure(s): CL LT HEART ANGIO CORONARIES CL FFR      Labs: BMP: Recent Labs   02/17/21 1945 02/17/21 2346 02/19/21 0500 NA 138 139 138 K 4.4 4.3 4.7 CL 103 103 105 BUN 18 18 19  CA 9.9 10.1 9.3      CBC: Recent Labs   02/17/21 1945 02/17/21 2346 WBC 6.7 7.0 HGB 13.7 13.9 HCT 40.8 42.2 MCV 85.0 83.8 PLT 170 184      PT/INR: Recent Labs   02/17/21 1945 02/17/21 2346 INR 1.0 1.0      Lipid Panel: Recent Labs   02/17/21 2346 LDL 69 HDL 47* TRIG 127      Physical Exam: BP 108/84 (Patient Position: Semi-Fowlers)    Pulse 76    Temp 99.1  F (37.3  C) (Temporal)    Resp 16    Ht 65 (165.1 cm)    Wt 303 lb 4.8 oz (137.6 kg)    SpO2 95%    BMI 50.47 kg/m?????? The patient was seen and examined on the day of   discharge. Constitutional:  Awake, alert, cooperative, no apparent distress, and appears stated age. HEENT:  Normal jugular venous pulsations, no carotid bruits.  Had is atraumatic, normocephalic.  Eyes and oral mucosa are normal. Pulmonary:  Good   respiratory effort.   Lungs are clear to auscultation. Cardiovascular:  PMI non-displaced, RRR  Abdomen:  Soft, nontender, nondistended.  Bowel sounds present.  No masses or tenderness. Skin:  Warm and dry Extremities:  No lower extremity edema.  No   cyanosis or clubbing. Neurological:  Non-focal, motor movement grossly intact.      Discharge Education: Lifestyle modifications were reviewed as well as discharge restrictions and follow up.      Condition at Discharge: Patient discharged in stable condition with good prognosis.      Disposition:  Home      Time spent on discharge was 35 minutes with coordination of care between patient, family, consulting physicians and care coordinating staff.      Electronically signed by: Hassan Rowan, CNP, 02/19/2021 10:05 AM      Please note that some or all of this record was generated using voice recognition software. If there are any questions about the content of this document, please contact the author as some errors of transcription may have occurred.           _________________________________  Signed by:   <ORC.11.3>RICHARD</ORC.11.3><ORC.11.3>KELLY</ORC.11.3> <ORC.11.4>LESLIE</ORC.11.4><ORC.11.4>D</ORC.11.4> <ORC.11.2>CALLIHAN</ORC.11.2><ORC.11.2>MOORE</ORC.11.2> <ORC.11.7/><ORC.11.7/>  Hassan Rowan     11    D: 02/18/2021 04:13 PM  T: 02/19/2021 12:10 PM    This document is confidential medical information.  Unauthorized disclosure or use of this information is prohibited by law.  If you are not the intended recipient of this document, please advise [place] by calling immediately [phone].

## 2021-02-19 NOTE — Unmapped (Signed)
For your records, the bill status(es) for LIELA RYLEE during their hospital stay is as follows: 02/17/2021 1857  Admission Emergency 02/17/2021 2133  Patient Update Observation 02/19/2021 1632  Discharge Observation       :      Any questions regarding PATIENT CLASS/STATUS should be directed to the Care Management Departments: [facility] [phone] or [facility] [phone] or [facility] [phone].           _________________________________  Signed byJamison Neighbor  NOTIFY     ZZ    D: 02/19/2021 04:32 PM  T:     This document is confidential medical information.  Unauthorized disclosure or use of this information is prohibited by law.  If you are not the intended recipient of this document, please advise [place] by calling immediately [phone].

## 2021-02-27 ENCOUNTER — Encounter

## 2021-02-27 ENCOUNTER — Ambulatory Visit: Admit: 2021-02-27 | Discharge: 2021-02-27 | Payer: PRIVATE HEALTH INSURANCE | Attending: Family | Primary: Family

## 2021-02-27 DIAGNOSIS — Z0000 Encounter for general adult medical examination without abnormal findings: Secondary | ICD-10-CM

## 2021-02-27 MED ORDER — DESLORATADINE 5 MG PO TABS
5 | ORAL_TABLET | Freq: Every day | ORAL | 1 refills | Status: DC
Start: 2021-02-27 — End: 2021-10-24

## 2021-02-27 MED ORDER — CLOTRIMAZOLE-BETAMETHASONE 1-0.05 % EX CREA
CUTANEOUS | 0 refills | Status: AC
Start: 2021-02-27 — End: 2022-04-13

## 2021-02-27 MED ORDER — ALBUTEROL SULFATE HFA 108 (90 BASE) MCG/ACT IN AERS
108 | Freq: Four times a day (QID) | RESPIRATORY_TRACT | 0 refills | Status: DC | PRN
Start: 2021-02-27 — End: 2021-04-09

## 2021-02-27 NOTE — Telephone Encounter (Signed)
Please advise

## 2021-02-27 NOTE — Telephone Encounter (Signed)
I sent over a topical and advise cool compress.

## 2021-02-27 NOTE — Progress Notes (Signed)
Rachael Mays  DOB: 20-Mar-1956  Encounter date: 02/27/2021    This isa 65 y.o. female who presents with  Chief Complaint   Patient presents with   ??? Annual Exam     using OTC allergy medicine that is not working, wants to address rash in skin folds       History of present illness:    HPI   History and Physical      Rachael Mays  Date of Birth:  November 11, 1956    Date of Service:  02/27/2021    Chief Complaint:   Rachael Mays is a 65 y.o. female who presents for complete physical examination    HPI: PT is 65 year old female for annual exam.  Recent labs showed uncontrolled diabetes with hgbA1c- 7.1, pt not taking medication.  Pt with hyperlipidemia, CHF, chronic pain from DDD, OSA, neuropathy, anxiety and depression.  Pt recently seen for LHC- no evidence CAD, started on Ranexa BID for idiopathic chest pain.  Pt is being followed by Dr. Latricia Heft, cardiology [facility].  Pt is advised for upcoming EGD for idiopathic chest pain.  Pt reports uncontrolled anxiety, has taken xanax, has not been on prescription for several months, pt is currently being seen by pain management, being treated with opioids, advise to reach out to verify co-administration.    Wt Readings from Last 3 Encounters:   02/27/21 (!) 304 lb 2 oz (138 kg)   08/29/20 (!) 307 lb 3.2 oz (139.3 kg)   07/17/20 300 lb (136.1 kg)     BP Readings from Last 3 Encounters:   02/27/21 110/72   08/29/20 138/60   05/17/20 111/65       Patient Active Problem List   Diagnosis   ??? Back pain   ??? Abdominal  pain, other specified site   ??? Morbid obesity with body mass index (BMI) of 45.0 to 49.9 in adult [facility])   ??? Status post gastric banding   ??? Tinnitus, subjective   ??? Essential hypertension   ??? 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 Adrenal mass, left ([place])   ??? Adrenal nodule ([place])   ??? Obstructive sleep apnea   ??? Abnormal stress test   ??? Chronic diastolic congestive heart failure ([place])   ??? Coronary artery disease due to lipid rich plaque   ??? Pulmonary hypertension   ??? Status post  insertion of drug-eluting stent into left anterior descending (LAD) artery   ??? Anxiety and depression   ??? Bradycardia   ??? Dyslipidemia   ??? Chronic eczematous otitis externa of both ears   ??? Mucositis   ??? Sprain of radiocarpal joint of right wrist   ??? Idiopathic peripheral neuropathy   ??? Chronic pain syndrome   ??? Unstable angina (HCC)   ??? Degeneration of thoracic intervertebral disc   ??? 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 Disc displacement, thoracic   ??? Bilateral leg numbness   ??? Chest pain with high risk for cardiac etiology   ??? Cellulitis of left leg   ??? Abscess of left leg   ??? Nonhealing surgical wound, subsequent encounter   ??? Non-pressure chronic ulcer of left thigh with fat layer exposed ([place])       Allergies   Allergen Reactions   ??? Ibuprofen    ??? Seasonal    ??? Talwin [Pentazocine] Other (See Comments)     dizzy     Outpatient Medications  Marked as Taking for the 02/27/21 encounter (Office Visit) with Romona Curls, APRN - NP   Medication Sig Dispense Refill   ??? lactulose (CHRONULAC) 10 GM/15ML solution lactulose 10 gram/15 mL oral solution   TAKE 15MLS BY MOUTH EVERY DAY AS NEEDED         Past Medical History:   Diagnosis Date   ??? Anxiety    ??? Asthma    ??? Blood circulation, collateral    ??? CAD (coronary artery disease)    ??? CHF (congestive heart failure) ([place])    ??? Chronic back pain    ??? Deep vein thrombosis    ??? 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 Depression    ??? GERD (gastroesophageal reflux disease)     NO LONGER SINCE LAP   ??? GERD (gastroesophageal reflux disease) 01/23/2009   ??? Hx of blood clots    ??? Hyperlipidemia     hx; resolved with lap band   ??? Hypertension    ??? Idiopathic peripheral neuropathy 03/26/2017   ??? Melanoma ([place])     upper back   ??? MRSA (methicillin resistant staph aureus) culture positive 10/17/2019    leg abscess   ??? Obesity     hx of; had lap band   ??? Obstructive sleep apnea  09/18/2015     Updating Deprecated Diagnoses   ??? Obstructive sleep apnea (adult) (pediatric) 09/18/2015   ??? Pulmonary embolism [facility])      Past Surgical History:   Procedure Laterality Date   ??? BACK SURGERY      neck  plates   ??? BREAST SURGERY      left lumpectomy   ??? CESAREAN SECTION     ??? CHOLECYSTECTOMY     ??? COLONOSCOPY     ??? COLONOSCOPY  04/08/10   ??? CORONARY ANGIOPLASTY     ??? DIAGNOSTIC CARDIAC CATH LAB PROCEDURE     ??? DILATATION, ESOPHAGUS     ??? ENDOSCOPY, COLON, DIAGNOSTIC     ??? HYSTERECTOMY     ??? HYSTERECTOMY, VAGINAL     ??? LAP BAND  05/01/08    Dr. Nicole Cella   ??? OTHER SURGICAL HISTORY      [place] Filter: curently in place   ??? OTHER SURGICAL HISTORY  07/05/13    lap band removal   ??? SHOULDER SURGERY     ??? SKIN CANCER EXCISION  12/29/2017   ??? UPPER GASTROINTESTINAL ENDOSCOPY  05/19/13   ??? UPPER GASTROINTESTINAL ENDOSCOPY  07/21/13    ESOPHAGEAL STENT PLACEMENT   ??? UPPER GASTROINTESTINAL ENDOSCOPY N/A 07/31/2014    Esophagogastroduodenoscopy with esophageal balloon dilation   ??? VASCULAR SURGERY  04/2015    Lucendia Herrlich, celiac artery angiogram, normal abd arteries     Family History   Problem Relation Age of Onset   ??? Arthritis Mother    ??? Cancer Mother    ??? Depression Mother    ??? High Blood Pressure Mother    ??? Heart Disease Mother 79        MI    ??? Ovarian Cancer Mother 85   ??? Cancer Father 24        colon   ??? Heart Disease Maternal Grandmother    ??? Early Death Paternal Grandmother    ??? Heart Disease Paternal Grandfather    ??? Diabetes Other    ??? High Blood Pressure Other    ??? Obesity Other    ??? Other Sister  OSA   ??? Other Brother         OSA   ??? Other Brother         OSA   ??? Other Daughter         OSA     Social History     Socioeconomic History   ??? Marital status: Divorced     Spouse name: [name]   ??? Number of children: 2   ??? Years of education: 31   ??? Highest education level: Not on file   Occupational History   ??? Not on file   Tobacco Use   ??? Smoking status: Former Smoker     Packs/day: 0.50     Years: 40.00      Pack years: 20.00     Types: Cigarettes     Quit date: 08/07/2012     Years since quitting: 8.5   ??? Smokeless tobacco: Former Systems developer     Quit date: 06/16/2013   ??? Tobacco comment: started to smoke at age 66 / only smoked 0.5 p.p.d    Vaping Use   ??? Vaping Use: Never used   Substance and Sexual Activity   ??? Alcohol use: No     Alcohol/week: 0.0 standard drinks   ??? Drug use: No   ??? Sexual activity: Not Currently   Other Topics Concern   ??? 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 Difficulty of Paying Living Expenses: Not hard at all   Food Insecurity: No Food Insecurity   ??? Worried About Charity fundraiser in the Last Year: Never true   ??? Ran Out of Food in the Last Year: Never true   Transportation Needs:    ??? Lack of Transportation (Medical): Not on file   ??? Lack of Transportation (Non-Medical): Not on file   Physical Activity:    ??? Days of Exercise per Week: Not on file   ??? Minutes of Exercise per Session: Not on file   Stress:    ??? Feeling of Stress : Not on file   Social Connections:    ??? Frequency of Communication with Friends and Family: Not on file   ??? Frequency of Social Gatherings with Friends and Family: Not on file   ??? Attends Religious Services: Not on file   ??? Active Member of Clubs or Organizations: Not on file   ??? Attends Archivist Meetings: Not on file   ??? Marital Status: Not on file   Intimate Partner Violence:    ??? Fear of Current or Ex-Partner: Not on file   ??? Emotionally Abused: Not on file   ??? Physically Abused: Not on file   ??? Sexually Abused: Not on file   Housing Stability:    ??? Unable to Pay for Housing in the Last Year: Not on file   ??? Number of Places Lived in the Last Year: Not on file   ??? Unstable Housing in the Last Year: Not on file       Hx abnormal PAP: no  Sexual activity: single partner, contraception - none   Self-breast exams: yes  Last eye exam: 2022    Exercise: no regular exercise    Review of Systems:  A comprehensive review of systems was negative except for what was  noted in the HPI.     Physical Exam:   Vitals:    02/27/21 1408   BP: 110/72   Site: Right Upper Arm   Position: Sitting   Cuff Size: Large Adult   Pulse: 77   Temp: 97.9 ??F (36.6 ??C)   TempSrc: Infrared   SpO2: 98%   Weight: (!) 304 lb 2 oz (138 kg)     Body mass index is 59.4 kg/m??.   Constitutional: She is oriented to person, place, and time. She appears well-developed and well-nourished. No distress.   HEENT:   Head: Normocephalic and atraumatic.   Right Ear: Tympanic membrane, external ear and ear canal normal.   Left Ear: Tympanic membrane, external ear and ear canal normal.   Mouth/Throat: Oropharynx is clear and moist, and mucous membranes are normal.  There is no cervical adenopathy.  Eyes: Conjunctivae and extraocular motions are normal. Pupils are equal, round, and reactive to light.   Neck: Supple. No JVD present. Carotid bruit is not present. No mass and no thyromegaly present.   Cardiovascular: Normal rate, regular rhythm, normal heart sounds and intact distal pulses.  Exam reveals no gallop and no friction rub.  No murmur heard.  Pulmonary/Chest: Effort normal and breath sounds normal. No respiratory distress. She has no wheezes, rhonchi or rales.   Abdominal: Soft, non-tender. Bowel sounds and aorta are normal. She exhibits no organomegaly, mass or bruit.   Genitourinary: examination not indicated.  Breast exam:  not examined.  Musculoskeletal: decreased ROM, chronic LBP, bilateral neuropathy LE's  Neurological: She is alert and oriented to person, place, and time. She has normal reflexes. No cranial nerve deficit. Coordination normal.   Skin: Skin is warm and dry. There is erythema L groin area from shaving procedure  Psychiatric: She has a normal mood and affect. Her speech is normal and behavior is normal. Judgment, cognition and memory are normal.   Tearful at  times    Preventive Care:  Health Maintenance   Topic Date Due   ??? COVID-19 Vaccine (1) Never done   ??? Depression Monitoring  Never done   ??? Shingles Vaccine (1 of 2) Never done   ??? Low dose CT lung screening  12/14/2016   ??? Flu vaccine (1) 08/07/2020   ??? A1C test (Diabetic or Prediabetic)  05/16/2021   ??? Lipid screen  02/13/2022   ??? Potassium monitoring  02/13/2022   ??? Creatinine monitoring  02/13/2022   ??? Breast cancer screen  07/17/2022   ??? Pneumococcal 0-64 years Vaccine (2 of 2 - PPSV23) 07/12/2024   ??? Colorectal Cancer Screen  09/03/2025   ??? DTaP/Tdap/Td vaccine (3 - Td or Tdap) 10/15/2029   ??? Hepatitis C screen  Completed   ??? HIV screen  Completed   ??? Hepatitis A vaccine  Aged Out   ??? Hepatitis B vaccine  Aged Out   ??? Hib vaccine  Aged Out   ??? Meningococcal (ACWY) vaccine  Aged Out           Preventive plan of care for COREEN SHIPPEE        02/27/2021           Preventive Measures Status       Recommendations for screening   Colon Cancer Screen   Last colonoscopy: 2011 Test recommended and ordered   Breast Cancer Screen  Last mammogram: 07/17/20  Repeat yearly   Cervical Cancer Screen   Last PAP smear: NA Repeat every 3 years  Osteoporosis Screen   Last DXA scan: NA Test recommended and ordered   Diabetes Screen  Glucose (mg/dL)   Date Value   02/13/2021 119 (H)    Repeat every 6 months   Cholesterol Screen  Lab Results   Component Value Date    CHOL 149 02/13/2021    TRIG 116 02/13/2021    HDL 48 (L) 02/13/2021    LDLCALC 78 02/13/2021    Repeat every 6 months   Aspirin for Cardiovascular Prevention   No Not indicated   Weight: Body mass index is 59.4 kg/m??.   (!) 304 lb 2 oz (138 kg)    Your BMI is 25 or greater, which indicates that you are overweight   Living Will: No   recommended    Recommended Immunizations    Immunization History   Administered Date(s) Administered   ??? Influenza, Intradermal, Quadrivalent, Preservative Free 11/03/2016   ??? Influenza, Quadv, IM, PF (6 mo and older Fluzone, Flulaval,  Fluarix, and 3 yrs and older Afluria) 09/15/2019   ??? Influenza, Quadv, Recombinant, IM PF (Flublok 18 yrs and older) 08/26/2017   ??? Pneumococcal Conjugate 13-valent (Prevnar13) 11/03/2016   ??? Pneumococcal Polysaccharide (Pneumovax23) 07/13/2019   ??? Tdap (Boostrix, Adacel) 11/07/2011, 10/16/2019        Influenza vaccine:  recommended every fall         Other Recommendations ??   Follow up in this office every 6 months for re-evaluation of your chronic medical issues                 Assessment/Plan:    Rachael Mays was seen today for annual exam.    Diagnoses and all orders for this visit:    Preventative health care    Screen for colon cancer  -     AFL - Sarajane Jews, MD, Gastroenterology, [place]    Chronic diastolic congestive heart failure [facility])  -     [place] - Connye Burkitt, MD, Cadiology, [place]    Post-menopausal  -     DEXA BONE DENSITY 2 SITES; Future    Essential hypertension  -     Microalbumin / Creatinine Urine Ratio; Future  -     Comprehensive Metabolic Panel, Fasting; Future    Dyslipidemia  -     Lipid, Fasting; Future  -     Comprehensive Metabolic Panel, Fasting; Future    Type 2 diabetes mellitus with diabetic polyneuropathy, without long-term current use of insulin (HCC)  -     Hemoglobin A1C; Future    Encounter for screening mammogram for malignant neoplasm of breast  -     MAM DIGITAL SCREEN W OR WO CAD BILATERAL; Future            Current Outpatient Medications on File Prior to Visit   Medication Sig Dispense Refill   ??? lactulose (CHRONULAC) 10 GM/15ML solution lactulose 10 gram/15 mL oral solution   TAKE 15MLS BY MOUTH EVERY DAY AS NEEDED     ??? Cal Carb-Mag Hydrox-Simeth 1000-200-40 MG CHEW Apply topically 2 times daily     ??? calcium citrate-vitamin D (CITRICAL + D) 315-250 MG-UNIT TABS per tablet Take 2 tablets by mouth 2 times daily     ??? gabapentin (NEURONTIN) 600 MG tablet      ??? HYDROcodone-acetaminophen (NORCO) 5-325 MG per tablet hydrocodone 5 mg-acetaminophen 325 mg tablet      ??? Multiple Vitamin (MULTIVITAMIN) tablet Take 1 tablet by mouth daily     ??? nystatin (  MYCOSTATIN) 100000 UNIT/GM cream nystatin 100,000 unit/gram topical cream     ??? ranolazine (RANEXA) 500 MG extended release tablet      ??? gabapentin (NEURONTIN) 300 MG capsule TAKE TWO CAPSULES BY MOUTH ONCE NIGHTLY 60 capsule 0   ??? triamcinolone (KENALOG) 0.1 % cream APPLY TO AFFECTED AREA(S) TWO TIMES A DAY FOR UP TO 2 WEEKS OR UNTIL IMPROVED 80 g 0   ??? montelukast (SINGULAIR) 10 MG tablet TAKE ONE TABLET BY MOUTH DAILY NEED TO MAKE APPOINTMENT WITH DOCTOR 30 tablet 0   ??? omeprazole (PRILOSEC) 40 MG delayed release capsule TAKE ONE CAPSULE BY MOUTH DAILY 27 capsule 1   ??? diclofenac sodium (VOLTAREN) 1 % GEL APPLY 4 GRAMS TO AFFECTED AREA(S) TWO TIMES A DAY 100 g 1   ??? fluocinonide (LIDEX) 0.05 % external solution APPLY SPARINGLY TO THE BACK OF THE SCALP DAILY AS NEEDED FOR ITCHING 60 mL 1   ??? ondansetron (ZOFRAN) 4 MG tablet TAKE ONE TABLET BY MOUTH THREE TIMES A DAY AS NEEDED FOR NAUSEA AND VOMITING 30 tablet 0   ??? meclizine (ANTIVERT) 25 MG tablet TAKE ONE TABLET BY MOUTH THREE TIMES A DAY AS NEEDED FOR DIZZINESS 15 tablet 0   ??? Cholecalciferol (VITAMIN D3) 50 MCG (2000 UT) CAPS Take 1 capsule by mouth daily 90 capsule 3   ??? lisinopril (PRINIVIL;ZESTRIL) 40 MG tablet TAKE ONE TABLET BY MOUTH DAILY 90 tablet 1   ??? spironolactone (ALDACTONE) 25 MG tablet TAKE ONE AND ONE-HALF TABLET BY MOUTH DAILY 38 tablet 0   ??? oxyCODONE-acetaminophen (PERCOCET) 5-325 MG per tablet      ??? rosuvastatin (CRESTOR) 10 MG tablet TAKE ONE TABLET BY MOUTH DAILY 90 tablet 3   ??? calcium-vitamin D (CALCIUM 500/D) 500-200 MG-UNIT per tablet Take 1 tablet by mouth daily     ??? acetic acid-hydrocortisone (VOSOL-HC) 1-2 % otic solution 4 drops in affected ear or ears every 3 hours while awake 15 mL 1   ??? albuterol sulfate HFA (PROVENTIL HFA) 108 (90 Base) MCG/ACT inhaler Inhale 2 puffs into the lungs every 6 hours as needed for Wheezing 3 Inhaler 3   ???  lidocaine (LIDODERM) 5 % Place 1 patch onto the skin daily 12 hours on, 12 hours off. 30 patch 0   ??? nitroGLYCERIN (NITROSTAT) 0.4 MG SL tablet up to max of 3 total doses. If no relief after 1 dose, call 911. 25 tablet 3   ??? torsemide (DEMADEX) 20 MG tablet Take 1 tablet by mouth daily as needed (swelling) 30 tablet 1   ??? aspirin 81 MG EC tablet Take 1 tablet by mouth daily 30 tablet 11   ??? Multiple Vitamins-Minerals (MULTIVITAMIN PO) Take 1 tablet by mouth daily        No current facility-administered medications on file prior to visit.      Allergies   Allergen Reactions   ??? Ibuprofen    ??? Seasonal    ??? Talwin [Pentazocine] Other (See Comments)     dizzy     Past Medical History:   Diagnosis Date   ??? Anxiety    ??? Asthma    ??? Blood circulation, collateral    ??? CAD (coronary artery disease)    ??? CHF (congestive heart failure) ([place])    ??? Chronic back pain    ??? Deep vein thrombosis    ??? 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 Depression    ??? GERD (gastroesophageal reflux disease)  NO LONGER SINCE LAP   ??? GERD (gastroesophageal reflux disease) 01/23/2009   ??? Hx of blood clots    ??? Hyperlipidemia     hx; resolved with lap band   ??? Hypertension    ??? Idiopathic peripheral neuropathy 03/26/2017   ??? Melanoma ([place])     upper back   ??? MRSA (methicillin resistant staph aureus) culture positive 10/17/2019    leg abscess   ??? Obesity     hx of; had lap band   ??? Obstructive sleep apnea 09/18/2015     Updating Deprecated Diagnoses   ??? Obstructive sleep apnea (adult) (pediatric) 09/18/2015   ??? Pulmonary embolism [facility])       Past Surgical History:   Procedure Laterality Date   ??? BACK SURGERY      neck  plates   ??? BREAST SURGERY      left lumpectomy   ??? CESAREAN SECTION     ??? CHOLECYSTECTOMY     ??? COLONOSCOPY     ??? COLONOSCOPY  04/08/10   ??? CORONARY ANGIOPLASTY     ??? DIAGNOSTIC CARDIAC CATH LAB PROCEDURE     ??? DILATATION, ESOPHAGUS     ??? ENDOSCOPY, COLON, DIAGNOSTIC     ??? HYSTERECTOMY     ??? HYSTERECTOMY, VAGINAL      ??? LAP BAND  05/01/08    Dr. Nicole Cella   ??? OTHER SURGICAL HISTORY      [place] Filter: curently in place   ??? OTHER SURGICAL HISTORY  07/05/13    lap band removal   ??? SHOULDER SURGERY     ??? SKIN CANCER EXCISION  12/29/2017   ??? UPPER GASTROINTESTINAL ENDOSCOPY  05/19/13   ??? UPPER GASTROINTESTINAL ENDOSCOPY  07/21/13    ESOPHAGEAL STENT PLACEMENT   ??? UPPER GASTROINTESTINAL ENDOSCOPY N/A 07/31/2014    Esophagogastroduodenoscopy with esophageal balloon dilation   ??? VASCULAR SURGERY  04/2015    Lucendia Herrlich, celiac artery angiogram, normal abd arteries      Family History   Problem Relation Age of Onset   ??? Arthritis Mother    ??? Cancer Mother    ??? Depression Mother    ??? High Blood Pressure Mother    ??? Heart Disease Mother 3        MI    ??? Ovarian Cancer Mother 33   ??? Cancer Father 70        colon   ??? Heart Disease Maternal Grandmother    ??? Early Death Paternal Grandmother    ??? Heart Disease Paternal Grandfather    ??? Diabetes Other    ??? High Blood Pressure Other    ??? Obesity Other    ??? Other Sister         OSA   ??? Other Brother         OSA   ??? Other Brother         OSA   ??? Other Daughter         OSA      Social History     Tobacco Use   ??? Smoking status: Former Smoker     Packs/day: 0.50     Years: 40.00     Pack years: 20.00     Types: Cigarettes     Quit date: 08/07/2012     Years since quitting: 8.5   ??? Smokeless tobacco: Former Systems developer     Quit date: 06/16/2013   ??? Tobacco comment: started to smoke at age 27 / only  smoked 0.5 p.p.d    Substance Use Topics   ??? Alcohol use: No     Alcohol/week: 0.0 standard drinks        Review of Systems    Objective:    BP 110/72 (Site: Right Upper Arm, Position: Sitting, Cuff Size: Large Adult)    Pulse 77    Temp 97.9 ??F (36.6 ??C) (Infrared)    Wt (!) 304 lb 2 oz (138 kg)    SpO2 98%    BMI 59.40 kg/m??   Weight: (!) 304 lb 2 oz (138 kg)     BP Readings from Last 3 Encounters:   02/27/21 110/72   08/29/20 138/60   05/17/20 111/65     Wt Readings from Last 3 Encounters:   02/27/21 (!) 304 lb 2 oz  (138 kg)   08/29/20 (!) 307 lb 3.2 oz (139.3 kg)   07/17/20 300 lb (136.1 kg)     BMI Readings from Last 3 Encounters:   02/27/21 59.40 kg/m??   08/29/20 60.00 kg/m??   07/17/20 58.59 kg/m??       Physical Exam    Assessment/Plan    1. Preventative health care  Advised routine dental and vision  Increased exercise, healthy diet and weight loss  Advised living will    2. Screen for colon cancer  - AFL - Sarajane Jews, MD, Gastroenterology, [place]    3. Chronic diastolic congestive heart failure (HCC)  Advised low salt diet  Diuretics  Discussed importance BP and cholesterol control  Referral placed to [facility]- Dr. Connye Burkitt      4. Post-menopausal  - DEXA BONE DENSITY 2 SITES; Future    5. Essential hypertension  Controlled  - Microalbumin / Creatinine Urine Ratio; Future  - Comprehensive Metabolic Panel, Fasting; Future    6. Dyslipidemia  Uncontrolled  Advised better control diet  Discuss statin medication  - Lipid, Fasting; Future  - Comprehensive Metabolic Panel, Fasting; Future    7. Type 2 diabetes mellitus with diabetic polyneuropathy, without long-term current use of insulin (HCC)  Uncontrolled  Repeat labs  Discussed Metformin  - Hemoglobin A1C; Future    8. Encounter for screening mammogram for malignant neoplasm of breast  - MAM DIGITAL SCREEN W OR WO CAD BILATERAL; Future      Return in about 6 months (around 08/30/2021) for hypertension re-check, lab review, medication re-check, diabetes re-check.    This dictation was generated by voice recognition computer software.  Although all attempts are made to edit the dictation for accuracy, there may be errors in the transcription that are not intended.

## 2021-02-27 NOTE — Telephone Encounter (Signed)
When Patient was here this afternoon, she showed you her leg where they did a heart cath.  She doesn't remember what you said to do for it.  [phone]

## 2021-02-27 NOTE — Telephone Encounter (Signed)
Spoke with Pt and let her know that a topical has been called into her preferred pharmacy and to apply a cool compress to the affected area as well. Also advised Pt that Marguarite Arbour has referred her to a cardiologist, Connye Burkitt. Provided Pt with phone number [phone]. Pt stated she understood and had no further questions.

## 2021-02-28 NOTE — Telephone Encounter (Signed)
Patient needs a referral to Dr. Melvenia Beam for a colonoscopy.  Fax:  [phone]    Provider out of the office

## 2021-02-28 NOTE — Telephone Encounter (Signed)
This patient can see anyone noninterventional.  [facility]

## 2021-02-28 NOTE — Telephone Encounter (Signed)
Patient has a referral from [facility] to [facility].  Please advise as to where patient can be scheduled.  Thank you.

## 2021-03-03 ENCOUNTER — Encounter

## 2021-03-03 NOTE — Telephone Encounter (Signed)
Done.

## 2021-03-03 NOTE — Telephone Encounter (Signed)
Patient is calling to let Marguarite Arbour know that her insurance doesn't cover the allergy medicine that she sent over for the patient. She is wanting to know if Marguarite Arbour will send something else in for her.    Please advise     [facility] [employer] out of the office

## 2021-03-03 NOTE — Telephone Encounter (Signed)
Please fax.

## 2021-03-03 NOTE — Telephone Encounter (Signed)
LM to call and schedule new pt app with wak or dkw. Paperwork on my desk.

## 2021-03-04 ENCOUNTER — Encounter

## 2021-03-04 MED ORDER — HYDROXYZINE PAMOATE 25 MG PO CAPS
25 MG | ORAL_CAPSULE | Freq: Every day | ORAL | 0 refills | Status: AC
Start: 2021-03-04 — End: 2021-03-18

## 2021-03-04 NOTE — Telephone Encounter (Signed)
Patient returning Suma's call. Patient was not sure what the call was about. No other information provided. Call back number [phone].

## 2021-03-04 NOTE — Telephone Encounter (Signed)
I am not comfortable with continuing the xanax, there is a huge contraindication due to high risk for respiratory depression and risk for death with comorbid administration with narcotics.  I prefer to try a different, non-sedative anti- anxiety medication.

## 2021-03-04 NOTE — Telephone Encounter (Signed)
Patient informed of below information.    Patient would like to know if there's something  Rachael Mays could prescribe for her anxiety since her anxiety is really bad.

## 2021-03-04 NOTE — Telephone Encounter (Signed)
Patient informed of below information.    Patient states her pain management dr needs letter or something stating that pt takes xanax and it's ok to take with percocet.

## 2021-03-04 NOTE — Telephone Encounter (Signed)
No answer. LVMTCBO.

## 2021-03-04 NOTE — Telephone Encounter (Signed)
See telephone encounter 3/28

## 2021-03-04 NOTE — Telephone Encounter (Signed)
2nd attempt to reach patient.

## 2021-03-04 NOTE — Telephone Encounter (Signed)
Pt is switching back to [facility] from [place]. Had an appt recently with PCP and didn't find any irregularities but doesn't know if cardiologist would like to see her again. Last visit was in 2020 and has seen Dr Susy Manor

## 2021-03-04 NOTE — Telephone Encounter (Signed)
Vistaril once daily sent to pharmacy.

## 2021-03-05 ENCOUNTER — Encounter

## 2021-03-05 MED ORDER — HYDROXYZINE PAMOATE 50 MG PO CAPS
50 | ORAL_CAPSULE | Freq: Three times a day (TID) | ORAL | 1 refills | Status: AC | PRN
Start: 2021-03-05 — End: 2021-04-04

## 2021-03-05 NOTE — Telephone Encounter (Signed)
Vistaril is both an antihistamine and used as anti-anxiety as needed, you have been prescribed it in the past.

## 2021-03-05 NOTE — Telephone Encounter (Signed)
Called the pt to scheduled them with [facility], pt ask if she can be seen by [facility] or [facility] because that is who she seen in the past.    Pls advise thank you

## 2021-03-05 NOTE — Telephone Encounter (Addendum)
Called the pt to scheduled appt, pt wants to be scheduled [facility] or [facility]  There is 3 telephone notes on this pt 3/25, 3/28 3/29

## 2021-03-05 NOTE — Telephone Encounter (Addendum)
07/17/16 - s/p PCI of LAD by Dr. Susy Manor.    12/25/16 - office visit with Dr. Susy Manor  01/11/17 - saw Dr. Jacqualyn Posey for diastolic heart failure.   05/07/17 - LHC by Dr. Susy Manor - nonobstructive disease and patent LAD stent  10/09/19 - last office visit by Tami NP     02/17/21 Hospitalized at [facility] for chest pain.  Had abn stress test.    02/18/21 LHC by Dr. Bunnie Philips - nonobstructive CAD, patient LAD stent

## 2021-03-05 NOTE — Telephone Encounter (Signed)
Called Pt, no answer, left voicemail to call back

## 2021-03-06 MED ORDER — OMEPRAZOLE 40 MG PO CPDR
40 MG | ORAL_CAPSULE | ORAL | 1 refills | Status: DC
Start: 2021-03-06 — End: 2021-04-21

## 2021-03-06 NOTE — Telephone Encounter (Signed)
Last appt 02/27/21. [place] for this?

## 2021-03-06 NOTE — Telephone Encounter (Signed)
Pt has been scheduled with [facility] on 04/02/21

## 2021-03-06 NOTE — Telephone Encounter (Signed)
Reviewed with Dr. Susy Manor.  Can schedule patient for Dr. Susy Manor appt on 03/07/21 at 8 am or 8:15 if patient is available.  Can also schedule patient with NP if she wants to be seen soon and cannot make the 03/07/21 appointment.    Left voice mail message asking patient to call the office back and ask for [facility]

## 2021-03-06 NOTE — Telephone Encounter (Signed)
Pt called back. Gave them info. Stated she understood with no further questions.

## 2021-03-06 NOTE — Telephone Encounter (Signed)
Spoke to patient and offered her tomorrow morning at 8:15, but she said that was too early for her.  Also offered her appt with Tami NP soon or Dr. Susy Manor in the summer.  She would like to be seen in the summer because she thinks her symptoms may be GI related.  She will schedule an appt with Dr. Melvenia Beam for GI testing which she wants to have done before she sees Dr. Susy Manor.    Patient scheduled for 06/18/21 at 1:15 pm

## 2021-03-11 NOTE — Telephone Encounter (Signed)
Pt has been scheduled with kjc on 06/18/21.

## 2021-03-17 ENCOUNTER — Encounter

## 2021-03-17 NOTE — Telephone Encounter (Signed)
montelukast (SINGULAIR) 10 MG tablet 30 tablet 0 01/31/2021     Sig: TAKE ONE TABLET BY MOUTH DAILY NEED TO MAKE APPOINTMENT WITH [facility] [id number] [address], [place] - [place] [phone] Wanda Plump [phone]   [address] [place] [address]   Phone:  [phone] ??Fax:  [phone]

## 2021-03-17 NOTE — Telephone Encounter (Signed)
Medication:   Requested Prescriptions     Pending Prescriptions Disp Refills   ??? montelukast (SINGULAIR) 10 MG tablet 30 tablet 0     Sig: TAKE ONE TABLET BY MOUTH DAILY NEED TO MAKE APPOINTMENT WITH DOCTOR      Last Filled:      Patient Phone Number: [phone] (home)     Last appt: 02/27/2021   Next appt: 09/01/2021    Last OARRS:   RX Monitoring 10/06/2018   Attestation -   Periodic Controlled Substance Monitoring No signs of potential drug abuse or diversion identified.;Possible medication side effects, risk of tolerance/dependence & alternative treatments discussed.;Assessed functional status.;Obtaining appropriate analgesic effect of treatment.   Chronic Pain > 80 MEDD -     PDMP Monitoring:    Last PDMP Elta Guadeloupe as Reviewed [facility]):  Review User Review Instant Review Result   Truett Mainland 01/03/2020 11:13 AM Reviewed PDMP [1]     Preferred Pharmacy:   [facility] [id number] [address], [place] [phone] Wanda Plump [phone]  [place]  [place] [address]  Phone: [phone] Fax: [phone]

## 2021-03-18 MED ORDER — MONTELUKAST SODIUM 10 MG PO TABS
10 MG | ORAL_TABLET | ORAL | 5 refills | Status: DC
Start: 2021-03-18 — End: 2021-09-02

## 2021-03-19 MED ORDER — GABAPENTIN 300 MG PO CAPS
300 MG | ORAL_CAPSULE | ORAL | 5 refills | Status: DC
Start: 2021-03-19 — End: 2021-10-03

## 2021-03-19 NOTE — Telephone Encounter (Signed)
Medication:   Requested Prescriptions     Pending Prescriptions Disp Refills   ??? gabapentin (NEURONTIN) 300 MG capsule [Pharmacy Med Name: GABAPENTIN 300 MG CAPSULE] 60 capsule 0     Sig: TAKE TWO CAPSULES BY MOUTH ONCE NIGHTLY      Last Filled:      Patient Phone Number: [phone] (home)     Last appt: 02/27/2021   Next appt: 09/01/2021    Last OARRS:   RX Monitoring 10/06/2018   Attestation -   Periodic Controlled Substance Monitoring No signs of potential drug abuse or diversion identified.;Possible medication side effects, risk of tolerance/dependence & alternative treatments discussed.;Assessed functional status.;Obtaining appropriate analgesic effect of treatment.   Chronic Pain > 80 MEDD -     PDMP Monitoring:    Last PDMP Elta Guadeloupe as Reviewed [facility]):  Review User Review Instant Review Result   Truett Mainland 01/03/2020 11:13 AM Reviewed PDMP [1]     Preferred Pharmacy:   [facility] [id number] [address], [place] [phone] Wanda Plump [phone]  [place]  [place] [address]  Phone: [phone] Fax: [phone]

## 2021-04-02 ENCOUNTER — Encounter: Attending: Cardiovascular Disease | Primary: Family

## 2021-04-09 ENCOUNTER — Encounter: Admit: 2021-04-09 | Discharge: 2021-04-09 | Payer: PRIVATE HEALTH INSURANCE | Attending: Dermatology | Primary: Family

## 2021-04-09 DIAGNOSIS — L821 Other seborrheic keratosis: Secondary | ICD-10-CM

## 2021-04-09 NOTE — Progress Notes (Signed)
[facility]  Antonietta Breach, MD  [place]  02/26/1956    65 y.o. female     Date of Visit: 04/09/2021    Chief Complaint: follow up for melanoma and intertrigo    History of Present Illness:    1.  She presents today for few persistent lesions on the lateral cheeks.    2.  She also complains of intermittent itching on the back and abdomen.  There is usually no associated rash.    3.  Follow-up for history of intertrigo-reports good control with use of an over-the-counter powder.    4.  She has been changing pigmented lesions on the upper portion of the trunk.    5.  She complains of a asymptomatic lesion below the left eye.    6.  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 Caryn Section on 12/29/2017.       Review of Systems:  Gen: Feels well, good sense of health.    Past Medical History, Family History, Surgical History, Medications and Allergies reviewed.    Past Medical History:   Diagnosis Date   ??? Anxiety    ??? Asthma    ??? Blood circulation, collateral    ??? CAD (coronary artery disease)    ??? CHF (congestive heart failure) ([place])    ??? Chronic back pain    ??? Deep vein thrombosis    ??? 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 Depression    ??? GERD (gastroesophageal reflux disease)     NO LONGER SINCE LAP   ??? GERD (gastroesophageal reflux disease) 01/23/2009   ??? Hx of blood clots    ??? Hyperlipidemia     hx; resolved with lap band   ??? Hypertension    ??? Idiopathic peripheral neuropathy 03/26/2017   ??? Melanoma ([place])     upper back   ??? MRSA (methicillin resistant staph aureus) culture positive 10/17/2019    leg abscess   ??? Obesity     hx of; had lap band   ??? Obstructive sleep apnea 09/18/2015     Updating Deprecated Diagnoses   ??? Obstructive sleep apnea (adult) (pediatric) 09/18/2015   ??? Pulmonary embolism [facility])      Past Surgical History:   Procedure Laterality  Date   ??? BACK SURGERY      neck  plates   ??? BREAST SURGERY      left lumpectomy   ??? CESAREAN SECTION     ??? CHOLECYSTECTOMY     ??? COLONOSCOPY     ??? COLONOSCOPY  04/08/10   ??? CORONARY ANGIOPLASTY     ??? DIAGNOSTIC CARDIAC CATH LAB PROCEDURE     ??? DILATATION, ESOPHAGUS     ??? ENDOSCOPY, COLON, DIAGNOSTIC     ??? HYSTERECTOMY     ??? HYSTERECTOMY, VAGINAL     ??? LAP BAND  05/01/08    Dr. Nicole Cella   ??? OTHER SURGICAL HISTORY      [place] Filter: curently in place   ??? OTHER SURGICAL HISTORY  07/05/13    lap band removal   ??? SHOULDER SURGERY     ??? SKIN CANCER EXCISION  12/29/2017   ??? UPPER GASTROINTESTINAL ENDOSCOPY  05/19/13   ??? UPPER GASTROINTESTINAL ENDOSCOPY  07/21/13    ESOPHAGEAL STENT PLACEMENT   ??? UPPER GASTROINTESTINAL ENDOSCOPY N/A 07/31/2014  Esophagogastroduodenoscopy with esophageal balloon dilation   ??? VASCULAR SURGERY  04/2015    Lucendia Herrlich, celiac artery angiogram, normal abd arteries       Allergies   Allergen Reactions   ??? Ibuprofen    ??? Seasonal    ??? Talwin [Pentazocine] Other (See Comments)     dizzy     Outpatient Medications Marked as Taking for the 04/09/21 encounter (Office Visit) with Francisca December, MD   Medication Sig Dispense Refill   ??? magnesium oxide (MAG-OX) 400 MG tablet Take 400 mg by mouth daily     ??? gabapentin (NEURONTIN) 300 MG capsule TAKE TWO CAPSULES BY MOUTH ONCE NIGHTLY 60 capsule 5   ??? montelukast (SINGULAIR) 10 MG tablet TAKE ONE TABLET BY MOUTH DAILY NEED TO MAKE APPOINTMENT WITH DOCTOR 30 tablet 5   ??? omeprazole (PRILOSEC) 40 MG delayed release capsule TAKE ONE CAPSULE BY MOUTH DAILY 27 capsule 1   ??? calcium citrate-vitamin D (CITRICAL + D) 315-250 MG-UNIT TABS per tablet Take 2 tablets by mouth 2 times daily     ??? lactulose (CHRONULAC) 10 GM/15ML solution lactulose 10 gram/15 mL oral solution   TAKE 15MLS BY MOUTH EVERY DAY AS NEEDED     ??? ranolazine (RANEXA) 500 MG extended release tablet      ??? clotrimazole-betamethasone (LOTRISONE) 1-0.05 % cream Apply topically 2 times daily. 45 g 0   ???  desloratadine (CLARINEX) 5 MG tablet Take 1 tablet by mouth daily 90 tablet 1   ??? triamcinolone (KENALOG) 0.1 % cream APPLY TO AFFECTED AREA(S) TWO TIMES A DAY FOR UP TO 2 WEEKS OR UNTIL IMPROVED 80 g 0   ??? diclofenac sodium (VOLTAREN) 1 % GEL APPLY 4 GRAMS TO AFFECTED AREA(S) TWO TIMES A DAY 100 g 1   ??? fluocinonide (LIDEX) 0.05 % external solution APPLY SPARINGLY TO THE BACK OF THE SCALP DAILY AS NEEDED FOR ITCHING 60 mL 1   ??? ondansetron (ZOFRAN) 4 MG tablet TAKE ONE TABLET BY MOUTH THREE TIMES A DAY AS NEEDED FOR NAUSEA AND VOMITING 30 tablet 0   ??? meclizine (ANTIVERT) 25 MG tablet TAKE ONE TABLET BY MOUTH THREE TIMES A DAY AS NEEDED FOR DIZZINESS 15 tablet 0   ??? Cholecalciferol (VITAMIN D3) 50 MCG (2000 UT) CAPS Take 1 capsule by mouth daily 90 capsule 3   ??? lisinopril (PRINIVIL;ZESTRIL) 40 MG tablet TAKE ONE TABLET BY MOUTH DAILY 90 tablet 1   ??? spironolactone (ALDACTONE) 25 MG tablet TAKE ONE AND ONE-HALF TABLET BY MOUTH DAILY 38 tablet 0   ??? oxyCODONE-acetaminophen (PERCOCET) 5-325 MG per tablet      ??? rosuvastatin (CRESTOR) 10 MG tablet TAKE ONE TABLET BY MOUTH DAILY 90 tablet 3   ??? calcium-vitamin D (CALCIUM 500/D) 500-200 MG-UNIT per tablet Take 1 tablet by mouth daily     ??? acetic acid-hydrocortisone (VOSOL-HC) 1-2 % otic solution 4 drops in affected ear or ears every 3 hours while awake 15 mL 1   ??? albuterol sulfate HFA (PROVENTIL HFA) 108 (90 Base) MCG/ACT inhaler Inhale 2 puffs into the lungs every 6 hours as needed for Wheezing 3 Inhaler 3   ??? lidocaine (LIDODERM) 5 % Place 1 patch onto the skin daily 12 hours on, 12 hours off. 30 patch 0   ??? nitroGLYCERIN (NITROSTAT) 0.4 MG SL tablet up to max of 3 total doses. If no relief after 1 dose, call 911. 25 tablet 3   ??? torsemide (DEMADEX) 20 MG tablet Take 1 tablet by  mouth daily as needed (swelling) 30 tablet 1   ??? aspirin 81 MG EC tablet Take 1 tablet by mouth daily 30 tablet 11   ??? Multiple Vitamins-Minerals (MULTIVITAMIN PO) Take 1 tablet by mouth  daily            Physical Examination       The following were examined and determined to be normal: Psych/Neuro, Scalp/hair, Head/face, Conjunctivae/eyelids, Gums/teeth/lips, Neck, Breast/axilla/chest, Abdomen, Back, RUE, LUE, RLE, LLE and Nails/digits.    The following were examined and determined to be abnormal: None.     Well-appearing.    1.  Lateral portions of the cheeks with few stuck on appearing tan-brown papules.    2.  Back and abdomen clear.    3.  Clear.    4.  Upper portion of the trunk and extremities with scattered round smooth light brown macules and patches.    5.  Left lower eyelid with a slightly pedunculated skin colored to tan papule.    6.  Upper portion of the back with a well-healed linear surgical scar.        Assessment and Plan     1. SK (seborrheic keratosis)     Reassurance.      2. Pruritus     CeraVe itch relief lotion with pramoxine daily as needed.     3. Intertrigo -under good control    Continue use of over-the-counter powder.     4. Solar lentigines    Monitor for change.      5. Skin tag     Reassurance.      6. History of melanoma of the back - no signs of recurrence.     Sun protective behaviors, including use of at least SPF 30 sunscreen, and self skin examinations were encouraged.  Call for any new or concerning lesions.           Return in about 6 months (around 10/10/2021).    --Francisca December, MD

## 2021-04-10 NOTE — Progress Notes (Signed)
Patient ___ reached   __X___not reached-preop instructions left on voice mail____[phone]_________      DATE___5-13-2022_____ TIME_____0845___ARRIVAL___0715______      Nothing to eat or drink after midnight the night before,except for what the prep instructions call for.If you do not have the instructions or do not understand them please contact your doctors office.    Follow any instructions your doctors office has given you including what medications to take the AM of your procedure and which ones to hold.You may use your inhalers.If you take a long acting insulin the eve prior please cut the dose in half and take no diabetic medications that AM.Follow specific doctors office instructions regarding blood thinners and if they want you to hold and for how long. If you are on a blood thinner and have no instructions please contact the office and ask.    Dress comfortably,bring your insurance card,picture ID,and a complete list of medications, including supplements.    You must have a responsible adult to stay with you during the procedure,drive you home and stay with you.    [facility] phone number [phone] for any questions.      VISITOR POLICY(subject to change)    Current visitor policy is 2 visitors per patient.No children allowed. Mask are required.  Visiting hours are 8a-8p. Overnight visitors will be at the discretion of the nurse.

## 2021-04-18 ENCOUNTER — Ambulatory Visit: Admit: 2021-04-18 | Primary: Student in an Organized Health Care Education/Training Program

## 2021-04-18 ENCOUNTER — Inpatient Hospital Stay: Payer: PRIVATE HEALTH INSURANCE

## 2021-04-18 MED ORDER — GLYCOPYRROLATE 0.2 MG/ML IJ SOLN
0.2 MG/ML | INTRAMUSCULAR | Status: DC | PRN
Start: 2021-04-18 — End: 2021-04-18
  Administered 2021-04-18: 13:00:00 .2 via INTRAVENOUS

## 2021-04-18 MED ORDER — LINACLOTIDE 145 MCG PO CAPS
145 | ORAL_CAPSULE | Freq: Every day | ORAL | 5 refills | Status: DC
Start: 2021-04-18 — End: 2022-01-26

## 2021-04-18 MED ORDER — SODIUM CHLORIDE 0.9 % IV SOLN
0.9 % | INTRAVENOUS | Status: DC
Start: 2021-04-18 — End: 2021-04-18
  Administered 2021-04-18: 12:00:00 via INTRAVENOUS

## 2021-04-18 MED ORDER — LIDOCAINE HCL (PF) 2 % IJ SOLN
2 % | INTRAMUSCULAR | Status: DC | PRN
Start: 2021-04-18 — End: 2021-04-18
  Administered 2021-04-18: 13:00:00 20 via INTRAVENOUS
  Administered 2021-04-18: 13:00:00 80 via INTRAVENOUS

## 2021-04-18 MED ORDER — PHENYLEPHRINE HCL 10 MG/ML SOLN (MIXTURES ONLY)
10 MG/ML | Status: DC | PRN
Start: 2021-04-18 — End: 2021-04-18
  Administered 2021-04-18: 13:00:00 100 via INTRAVENOUS

## 2021-04-18 MED ORDER — PROPOFOL 200 MG/20ML IV EMUL
200 MG/20ML | INTRAVENOUS | Status: DC | PRN
Start: 2021-04-18 — End: 2021-04-18
  Administered 2021-04-18: 13:00:00 40 via INTRAVENOUS
  Administered 2021-04-18: 13:00:00 160 via INTRAVENOUS
  Administered 2021-04-18 (×4): 20 via INTRAVENOUS
  Administered 2021-04-18 (×2): 40 via INTRAVENOUS
  Administered 2021-04-18: 13:00:00 20 via INTRAVENOUS

## 2021-04-18 MED ORDER — SIMETHICONE 40 MG/0.6ML PO SUSP
40 MG/0.6ML | ORAL | Status: DC | PRN
Start: 2021-04-18 — End: 2021-04-18
  Administered 2021-04-18: 13:00:00

## 2021-04-18 NOTE — Discharge Instructions (Signed)
EGD and COLONOSCOPY DISCHARGE INSTRUCTIONS    EGD DISCHARGE INSTRUCTIONS    . Use salt water gargle, lozenges, or Chloraseptic spray as needed for sore throat.    . If you have fever, chills, excessive bleeding, severe chest pain, or abdominal pain, or any other problems, contact your physician's office immediately at [phone].    . If you had an esophageal dilatation, and experience fever, chills, excessive bleeding, shortness of breath, chest or abdominal pain, or any other unusual symptom, call the office immediately at [phone].    Marland Kitchen Continue home medications as directed.    . Call physician's office in five to seven business days for biopsy results or further instructions.    . You need another EGD in _______________________________.   .    . Call your physician's office at [phone] for a follow-up appointment in _______________.    Marland Kitchen See your physician's report for procedure details and recommendations.    COLONOSCOPY DISCHARGE INSTRUCTIONS     You may experience some lightheadedness for the next several hours.   Plan on quiet relaxation for the rest of today. Nap for four hours following procedure if possible.   A responsible adult needs to stay with you today.     Eat bland food and avoid anything greasy or spicy initially-progress to your normal diet gradually.  Diet restrictions as instructed.     You may resume home medications as instructed.     It is not unusual to experience some mild cramping or gas pains, and you may not have a bowel movement for several days.     If you have any of the following problems, notify your physician or return to the hospital emergency room : fever, chills, excessive bleeding, excessive vomiting, difficulty swallowing, uncontrolled pain, increased abdominal distention, shortness of breath or any other problems.     If you had a polyp removed, avoid strenuous activity for 48 hours.Avoid the use of  aspirin or related compounds for one week, unless otherwise instructed by your physician.  You may notice a small amount of blood in your next few bowel movements, but if a large amount passes, call your physician.     Call your doctor at [phone] if you have any concerns.     If you had any  biopsies or polyps, call the doctor's office in 5-7 business days for results.    See your physician's report for details about your procedure and recommendations.      ANESTHESIA DISCHARGE INSTRUCTIONS     Wear your seatbelt home.   You are under the influence of drugs-do not drink alcohol, drive ,operate machinery,or make any important decisions or sign any legal documentsfor 24 hours. You may resume normal activities tomorrow.   A responsible adult needs to be with you for 24 hours.   You may experience lightheadedness,dizziness,or sleepiness following surgery.   Rest at home today- increase activity as tolerated. It is recommended to take a four hour nap after procedure.   Progress slowly to a regular diet unless your physician has instructed you otherwise. Avoid spicy and greasy food on first meal.  Drink plenty of water.   If nausea becomes a problem call your physician.     Call your doctor if concerns arise.       High-Fiber Diet: Care Instructions  Overview     A high-fiber diet may help you relieve constipation and feel less bloated.  Your doctor and dietitian will help you make a  high-fiber eating plan based on your personal needs. The plan will include the things you like to eat. It willalso make sure that you get 25 to 35 grams of fiber a day.  Before you make changes to the way you eat, be sure to talk with your doctor ordietitian.  Follow-up care is a key part of your treatment and safety. Be sure to make and go to all appointments, and call your doctor if you are having problems. It's also a good idea to know your test results and keep alist of the medicines you take.  How can you care for yourself  at home?  Marland Kitchen You can increase how much fiber you get if you eat more of certain foods. These foods include:  ? Whole-grain breads and cereals.  ? Fruits, such as pears, apples, and peaches. Eat the skins and peels if you can.  ? Vegetables, such as broccoli, cabbage, spinach, carrots, asparagus, and squash.  ? Starchy vegetables. These include potatoes with skins, kidney beans, and lima beans.  . Take a fiber supplement every day if your doctor recommends it. Examples are Benefiber, Citrucel, FiberCon, and Metamucil. Ask your doctor how much to take.  . Drink plenty of fluids. If you have kidney, heart, or liver disease and have to limit fluids, talk with your doctor before you increase the amount of fluids you drink.  Where can you learn more?  Go to [email or web address] and sign in to your MyChart account. Enter [phone] in the [place] box to learn more about "High-Fiber Diet: Care Instructions."     If you do not have an account, please click on the "Sign Up Now" link.  Current as of: September 8, 2021Content Version: 13.2   2006-2022 [employer].   Care instructions adapted under license by [facility]. If you have questions about a medical condition or this instruction, always ask your healthcare professional. [place] any warranty or liability for your use of this information.         Gastroesophageal Reflux Disease (GERD): Care Instructions  Overview     Gastroesophageal reflux disease (GERD) is the backward flow of stomach acid into the esophagus. The esophagus is the tube that leads from your throat to your stomach. A one-way valve prevents the stomach acid from backing up into this tube. But when you have GERD, this valve does not close tightly enough. This can also cause pain and swelling in your esophagus. (This is calledesophagitis.)  If you have mild GERD symptoms including heartburn, you may be able to control the  problem with antacids or over-the-counter medicine. You can also make lifestyle changes to help reduce your symptoms. These include changing yourdiet and eating habits, such as not eating late at night and losing weight.  Follow-up care is a key part of your treatment and safety. Be sure to make and go to all appointments, and call your doctor if you are having problems. It's also a good idea to know your test results and keep alist of the medicines you take.  How can you care for yourself at home?  . Take your medicines exactly as prescribed. Call your doctor if you think you are having a problem with your medicine.  . Your doctor may recommend over-the-counter medicine. For mild or occasional indigestion, antacids, such as Tums, Gaviscon, Mylanta, or Maalox, may help. Your doctor also may recommend over-the-counter acid reducers, such as famotidine (Pepcid AC), cimetidine (Tagamet HB), or omeprazole (Prilosec). Read  and follow all instructions on the label. If you use these medicines often, talk with your doctor.  . Change your eating habits.  ? It's best to eat several small meals instead of two or three large meals.  ? After you eat, wait 2 to 3 hours before you lie down.  ? Avoid foods that make your symptoms worse. These may include chocolate, mint, alcohol, pepper, spicy foods, high-fat foods, or drinks with caffeine in them, such as tea, coffee, colas, or energy drinks. If your symptoms are worse after you eat a certain food, you may want to stop eating it to see if your symptoms get better.  . Do not smoke or chew tobacco. Smoking can make GERD worse. If you need help quitting, talk to your doctor about stop-smoking programs and medicines. These can increase your chances of quitting for good.  . If you have GERD symptoms at night, raise the head of your bed 6 to 8 inches by putting the frame on blocks or placing a foam wedge under the head of your mattress. (Adding extra pillows does not work.)  . Do not  wear tight clothing around your middle.  Marland Kitchen Lose weight if you need to. Losing just 5 to 10 pounds can help.  When should you call for help?   Call your doctor now or seek immediate medical care if:   . You have new or different belly pain.    . Your stools are black and tarlike or have streaks of blood.   Watch closely for changes in your health, and be sure to contact your doctor if:   . Your symptoms have not improved after 2 days.    . Food seems to catch in your throat or chest.   Where can you learn more?  Go to [email or web address] and sign in to your MyChart account. Enter [phone] in the [place] box to learn more about "Gastroesophageal Reflux Disease (GERD): Care Instructions."     If you do not have an account, please click on the "Sign Up Now" link.  Current as of: September 8, 2021Content Version: 13.2   2006-2022 Healthwise, Incorporated.   Care instructions adapted under license by [facility]. If you have questions about a medical condition or this instruction, always ask your healthcare professional. [place] any warranty or liability for your use of this information.

## 2021-04-18 NOTE — Progress Notes (Signed)
Teaching / education initiated regarding perioperative experience, expectations, and pain management during stay. Patient verbalized understanding.

## 2021-04-18 NOTE — Progress Notes (Signed)
Pt ready for discharge - pt discharged in stable condition.

## 2021-04-18 NOTE — H&P (Signed)
Gastroenterology Note             Pre-operative History and Physical    Patient: Rachael Mays  DOB: 08-21-1956  CSN:     History Obtained From:  patient and/or guardian.     HISTORY OF PRESENT ILLNESS:    The patient is a 65 y.o. female with dysphagia, GERD, and personal history of colon polyps here for EGD and colonoscopy.      Past Medical History:    Past Medical History:   Diagnosis Date   ??? Anxiety    ??? Asthma    ??? Blood circulation, collateral    ??? CAD (coronary artery disease)    ??? CHF (congestive heart failure) ([place])    ??? Chronic back pain    ??? Deep vein thrombosis    ??? 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 Depression    ??? GERD (gastroesophageal reflux disease)     NO LONGER SINCE LAP   ??? GERD (gastroesophageal reflux disease) 01/23/2009   ??? Hx of blood clots    ??? Hyperlipidemia     hx; resolved with lap band   ??? Hypertension    ??? Idiopathic peripheral neuropathy 03/26/2017   ??? Melanoma ([place])     upper back   ??? MRSA (methicillin resistant staph aureus) culture positive 10/17/2019    leg abscess   ??? Obesity     hx of; had lap band   ??? Obstructive sleep apnea 09/18/2015     Updating Deprecated Diagnoses   ??? Obstructive sleep apnea (adult) (pediatric) 09/18/2015   ??? Pulmonary embolism [facility])      Past Surgical History:    Past Surgical History:   Procedure Laterality Date   ??? BACK SURGERY      neck  plates   ??? BREAST SURGERY      left lumpectomy   ??? CESAREAN SECTION     ??? CHOLECYSTECTOMY     ??? COLONOSCOPY     ??? COLONOSCOPY  04/08/10   ??? CORONARY ANGIOPLASTY     ??? DIAGNOSTIC CARDIAC CATH LAB PROCEDURE     ??? DILATATION, ESOPHAGUS     ??? ENDOSCOPY, COLON, DIAGNOSTIC     ??? HYSTERECTOMY     ??? HYSTERECTOMY, VAGINAL     ??? LAP BAND  05/01/08    Dr. Nicole Cella   ??? OTHER SURGICAL HISTORY      [place] Filter: curently in place   ??? OTHER SURGICAL HISTORY  07/05/13    lap band removal   ??? SHOULDER SURGERY     ??? SKIN CANCER EXCISION  12/29/2017   ??? UPPER GASTROINTESTINAL ENDOSCOPY  05/19/13   ???  UPPER GASTROINTESTINAL ENDOSCOPY  07/21/13    ESOPHAGEAL STENT PLACEMENT   ??? UPPER GASTROINTESTINAL ENDOSCOPY N/A 07/31/2014    Esophagogastroduodenoscopy with esophageal balloon dilation   ??? VASCULAR SURGERY  04/2015    Lucendia Herrlich, celiac artery angiogram, normal abd arteries     Medications Prior to Admission:   No current facility-administered medications on file prior to encounter.     Current Outpatient Medications on File Prior to Encounter   Medication Sig Dispense Refill   ??? cetirizine (ZYRTEC) 10 MG tablet Take 10 mg by mouth daily     ??? gabapentin (NEURONTIN) 300 MG capsule TAKE TWO CAPSULES BY MOUTH ONCE NIGHTLY 60 capsule 5   ??? montelukast (SINGULAIR) 10 MG tablet TAKE ONE TABLET BY MOUTH DAILY NEED TO MAKE APPOINTMENT WITH DOCTOR 30 tablet 5   ???  omeprazole (PRILOSEC) 40 MG delayed release capsule TAKE ONE CAPSULE BY MOUTH DAILY 27 capsule 1   ??? calcium citrate-vitamin D (CITRICAL + D) 315-250 MG-UNIT TABS per tablet Take 2 tablets by mouth 2 times daily     ??? lactulose (CHRONULAC) 10 GM/15ML solution lactulose 10 gram/15 mL oral solution   TAKE 15MLS BY MOUTH EVERY DAY AS NEEDED     ??? ranolazine (RANEXA) 500 MG extended release tablet      ??? clotrimazole-betamethasone (LOTRISONE) 1-0.05 % cream Apply topically 2 times daily. 45 g 0   ??? desloratadine (CLARINEX) 5 MG tablet Take 1 tablet by mouth daily (Patient not taking: Reported on 04/18/2021) 90 tablet 1   ??? triamcinolone (KENALOG) 0.1 % cream APPLY TO AFFECTED AREA(S) TWO TIMES A DAY FOR UP TO 2 WEEKS OR UNTIL IMPROVED 80 g 0   ??? diclofenac sodium (VOLTAREN) 1 % GEL APPLY 4 GRAMS TO AFFECTED AREA(S) TWO TIMES A DAY 100 g 1   ??? fluocinonide (LIDEX) 0.05 % external solution APPLY SPARINGLY TO THE BACK OF THE SCALP DAILY AS NEEDED FOR ITCHING 60 mL 1   ??? ondansetron (ZOFRAN) 4 MG tablet TAKE ONE TABLET BY MOUTH THREE TIMES A DAY AS NEEDED FOR NAUSEA AND VOMITING 30 tablet 0   ??? meclizine (ANTIVERT) 25 MG tablet TAKE ONE TABLET BY MOUTH THREE TIMES A DAY AS  NEEDED FOR DIZZINESS 15 tablet 0   ??? Cholecalciferol (VITAMIN D3) 50 MCG (2000 UT) CAPS Take 1 capsule by mouth daily 90 capsule 3   ??? lisinopril (PRINIVIL;ZESTRIL) 40 MG tablet TAKE ONE TABLET BY MOUTH DAILY 90 tablet 1   ??? spironolactone (ALDACTONE) 25 MG tablet TAKE ONE AND ONE-HALF TABLET BY MOUTH DAILY 38 tablet 0   ??? oxyCODONE-acetaminophen (PERCOCET) 5-325 MG per tablet      ??? rosuvastatin (CRESTOR) 10 MG tablet TAKE ONE TABLET BY MOUTH DAILY 90 tablet 3   ??? calcium-vitamin D (CALCIUM 500/D) 500-200 MG-UNIT per tablet Take 1 tablet by mouth daily     ??? acetic acid-hydrocortisone (VOSOL-HC) 1-2 % otic solution 4 drops in affected ear or ears every 3 hours while awake 15 mL 1   ??? albuterol sulfate HFA (PROVENTIL HFA) 108 (90 Base) MCG/ACT inhaler Inhale 2 puffs into the lungs every 6 hours as needed for Wheezing 3 Inhaler 3   ??? lidocaine (LIDODERM) 5 % Place 1 patch onto the skin daily 12 hours on, 12 hours off. 30 patch 0   ??? nitroGLYCERIN (NITROSTAT) 0.4 MG SL tablet up to max of 3 total doses. If no relief after 1 dose, call 911. 25 tablet 3   ??? torsemide (DEMADEX) 20 MG tablet Take 1 tablet by mouth daily as needed (swelling) 30 tablet 1   ??? aspirin 81 MG EC tablet Take 1 tablet by mouth daily 30 tablet 11   ??? Multiple Vitamins-Minerals (MULTIVITAMIN PO) Take 1 tablet by mouth daily           Allergies:  Ibuprofen, Seasonal, and [place] [pentazocine]      Social History:   Social History     Tobacco Use   ??? Smoking status: Former Smoker     Packs/day: 0.50     Years: 40.00     Pack years: 20.00     Types: Cigarettes     Quit date: 08/07/2012     Years since quitting: 8.7   ??? Smokeless tobacco: Former Systems developer     Quit date: 06/16/2013   ??? Tobacco  comment: started to smoke at age 73 / only smoked 0.5 p.p.d    Substance Use Topics   ??? Alcohol use: No     Alcohol/week: 0.0 standard drinks     Family History:   Family History   Problem Relation Age of Onset   ??? Arthritis Mother    ??? Cancer Mother    ??? Depression Mother     ??? High Blood Pressure Mother    ??? Heart Disease Mother 62        MI    ??? Ovarian Cancer Mother 71   ??? Cancer Father 39        colon   ??? Heart Disease Maternal Grandmother    ??? Early Death Paternal Grandmother    ??? Heart Disease Paternal Grandfather    ??? Diabetes Other    ??? High Blood Pressure Other    ??? Obesity Other    ??? Other Sister         OSA   ??? Other Brother         OSA   ??? Other Brother         OSA   ??? Other Daughter         OSA       PHYSICAL EXAM:      BP 139/62    Pulse 76    Temp 97.2 ??F (36.2 ??C) (Temporal)    Resp 18    Ht 5' (1.524 m)    Wt 300 lb (136.1 kg)    SpO2 97%    BMI 58.59 kg/m??  I        Heart:   RRR, normal s1s2    Lungs:  CTA bilat,  Normal effort    Abdomen:   NT, ND      ASA Grade:  ASA 4 - Patient with severe systemic disease that is a constant threat to life    Mallampati Class:  Class I: Soft palate, uvula, fauces, pillars visible  __________  Class II: Soft palate, uvula, fauces visible  __________   Class III: Soft palate, base of uvula visible  ____X_____  Class IV: Hard palate only visible   __________        ASSESSMENT AND PLAN:    1.  Patient is a 65 y.o. female here for EGD and colonoscopy with MAC.   2.  Procedure options, risks and benefits reviewed with patient.  Patient expresses understanding.     Norwood Levo, [place]  04/18/2021

## 2021-04-18 NOTE — Progress Notes (Signed)
Post-procedure education reviewed with patient and visitor, and they verbalized understanding.

## 2021-04-18 NOTE — Anesthesia Pre-Procedure Evaluation (Signed)
Department of Anesthesiology  Preprocedure Note       Name:  Rachael Mays   Age:  65 y.o.  DOB:  December 16, 1955                                          MRN:  [id number]         Date:  04/18/2021      Surgeon: Juliann Mule):  Norwood Levo, MD    Procedure: Procedure(s):  EGD DIAGNOSTIC ONLY  COLONOSCOPY DIAGNOSTIC    Medications prior to admission:   Prior to Admission medications    Medication Sig Start Date End Date Taking? Authorizing Provider   magnesium oxide (MAG-OX) 400 MG tablet Take 400 mg by mouth daily    Historical Provider, MD   gabapentin (NEURONTIN) 300 MG capsule TAKE TWO CAPSULES BY MOUTH ONCE NIGHTLY 03/19/21 09/15/21  Marguarite Arbour Leigh Wojciechowski, APRN - NP   montelukast (SINGULAIR) 10 MG tablet TAKE ONE TABLET BY MOUTH DAILY NEED TO MAKE APPOINTMENT WITH DOCTOR 03/18/21   Marguarite Arbour Leigh Wojciechowski, APRN - NP   omeprazole (PRILOSEC) 40 MG delayed release capsule TAKE ONE CAPSULE BY MOUTH DAILY 03/06/21   Marguarite Arbour Leigh Wojciechowski, APRN - NP   calcium citrate-vitamin D (CITRICAL + D) 315-250 MG-UNIT TABS per tablet Take 2 tablets by mouth 2 times daily    Historical Provider, MD   lactulose (CHRONULAC) 10 GM/15ML solution lactulose 10 gram/15 mL oral solution   TAKE 15MLS BY MOUTH EVERY DAY AS NEEDED 08/02/20   Historical Provider, MD   ranolazine (RANEXA) 500 MG extended release tablet  02/18/21   Historical Provider, MD   clotrimazole-betamethasone (LOTRISONE) 1-0.05 % cream Apply topically 2 times daily. 02/27/21   Marguarite Arbour Leigh Wojciechowski, APRN - NP   desloratadine (CLARINEX) 5 MG tablet Take 1 tablet by mouth daily 02/27/21   Marguarite Arbour Leigh Wojciechowski, APRN - NP   triamcinolone (KENALOG) 0.1 % cream APPLY TO AFFECTED AREA(S) TWO TIMES A DAY FOR UP TO 2 WEEKS OR UNTIL IMPROVED 02/03/21   Francisca December, MD   diclofenac sodium (VOLTAREN) 1 % GEL APPLY 4 GRAMS TO AFFECTED AREA(S) TWO TIMES A DAY 12/23/20   Jamal Collin, MD   fluocinonide (LIDEX) 0.05 % external solution APPLY SPARINGLY TO THE BACK  OF THE SCALP DAILY AS NEEDED FOR ITCHING 12/18/20   Francisca December, MD   ondansetron (ZOFRAN) 4 MG tablet TAKE ONE TABLET BY MOUTH THREE TIMES A DAY AS NEEDED FOR NAUSEA AND VOMITING 12/11/20   Marguarite Arbour Sheila Oats, APRN - NP   meclizine (ANTIVERT) 25 MG tablet TAKE ONE TABLET BY MOUTH THREE TIMES A DAY AS NEEDED FOR DIZZINESS 09/24/20   Marguarite Arbour Sheila Oats, APRN - NP   Cholecalciferol (VITAMIN D3) 50 MCG (2000 UT) CAPS Take 1 capsule by mouth daily 08/13/20   Romona Curls, APRN - NP   lisinopril (PRINIVIL;ZESTRIL) 40 MG tablet TAKE ONE TABLET BY MOUTH DAILY 02/27/20   Marguarite Arbour Sheila Oats, APRN - NP   spironolactone (ALDACTONE) 25 MG tablet TAKE ONE AND ONE-HALF TABLET BY MOUTH DAILY 02/12/20   Marguarite Arbour Sheila Oats, APRN - NP   oxyCODONE-acetaminophen (PERCOCET) 5-325 MG per tablet  02/06/20   Historical Provider, MD   rosuvastatin (CRESTOR) 10 MG tablet TAKE ONE TABLET BY MOUTH DAILY 11/13/19   Luiz Iron, APRN - CNP   calcium-vitamin D (CALCIUM 500/D) 500-200 MG-UNIT  per tablet Take 1 tablet by mouth daily    Historical Provider, MD   acetic acid-hydrocortisone (VOSOL-HC) 1-2 % otic solution 4 drops in affected ear or ears every 3 hours while awake 07/27/19   Alvina Chou, MD   albuterol sulfate HFA (PROVENTIL HFA) 108 (90 Base) MCG/ACT inhaler Inhale 2 puffs into the lungs every 6 hours as needed for Wheezing 07/13/19   Almira Coaster, APRN - CNP   lidocaine (LIDODERM) 5 % Place 1 patch onto the skin daily 12 hours on, 12 hours off. 04/05/19   Sharmaine Base Wanninger, PA-C   nitroGLYCERIN (NITROSTAT) 0.4 MG SL tablet up to max of 3 total doses. If no relief after 1 dose, call 911. 03/31/19   Luiz Iron, APRN - CNP   torsemide (DEMADEX) 20 MG tablet Take 1 tablet by mouth daily as needed (swelling) 03/22/19   Luiz Iron, APRN - CNP   aspirin 81 MG EC tablet Take 1 tablet by mouth daily 02/16/17   Harvel Quale, MD   Multiple Vitamins-Minerals (MULTIVITAMIN PO)  Take 1 tablet by mouth daily     Historical Provider, MD       Current medications:    Current Facility-Administered Medications   Medication Dose Route Frequency Provider Last Rate Last Admin   ??? 0.9 % sodium chloride infusion   IntraVENous Continuous Wilson Singer, MD           Allergies:    Allergies   Allergen Reactions   ??? Ibuprofen    ??? Seasonal    ??? Talwin [Pentazocine] Other (See Comments)     dizzy       Problem List:    Patient Active Problem List   Diagnosis Code   ??? Back pain M54.9   ??? Abdominal  pain, other specified site R10.9   ??? Morbid obesity with body mass index (BMI) of 45.0 to 49.9 in adult (HCC) E66.01, Z68.42   ??? 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 Tinnitus, subjective H93.19   ??? Essential hypertension I10   ??? 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 Adrenal mass, left (HCC) E27.8   ??? Adrenal nodule (HCC) E27.8   ??? Obstructive sleep apnea G47.33   ??? Abnormal stress test R94.39   ??? Chronic diastolic congestive heart failure (HCC) I50.32   ??? Coronary artery disease due to lipid rich plaque I25.10, I25.83   ??? Pulmonary hypertension I27.20   ??? Status post insertion of drug-eluting stent into left anterior descending (LAD) artery Z95.5   ??? Anxiety and depression F41.9, F32.A   ??? Bradycardia R00.1   ??? Dyslipidemia E78.5   ??? Chronic eczematous otitis externa of both ears H60.8X3   ??? Mucositis K12.30   ??? Sprain of radiocarpal joint of right wrist S63.521A   ??? Idiopathic peripheral neuropathy G60.9   ??? Chronic pain syndrome G89.4   ??? Unstable angina (HCC) I20.0   ??? 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 Displacement of thoracic intervertebral disc without myelopathy M51.24   ??? 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 Disc displacement, thoracic M51.24   ??? Bilateral leg numbness R20.0   ??? Chest pain with high risk for cardiac etiology R07.9   ??? Cellulitis of left leg L03.116   ??? Abscess of left leg L02.416   ??? Nonhealing surgical wound, subsequent encounter T81.89XD   ???  Non-pressure chronic ulcer of left thigh with fat layer exposed ([place]) T01.779  Past Medical History:        Diagnosis Date   ??? Anxiety    ??? Asthma    ??? Blood circulation, collateral    ??? CAD (coronary artery disease)    ??? CHF (congestive heart failure) ([place])    ??? Chronic back pain    ??? Deep vein thrombosis    ??? 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 Depression    ??? GERD (gastroesophageal reflux disease)     NO LONGER SINCE LAP   ??? GERD (gastroesophageal reflux disease) 01/23/2009   ??? Hx of blood clots    ??? Hyperlipidemia     hx; resolved with lap band   ??? Hypertension    ??? Idiopathic peripheral neuropathy 03/26/2017   ??? Melanoma ([place])     upper back   ??? MRSA (methicillin resistant staph aureus) culture positive 10/17/2019    leg abscess   ??? Obesity     hx of; had lap band   ??? Obstructive sleep apnea 09/18/2015     Updating Deprecated Diagnoses   ??? Obstructive sleep apnea (adult) (pediatric) 09/18/2015   ??? Pulmonary embolism [facility])        Past Surgical History:        Procedure Laterality Date   ??? BACK SURGERY      neck  plates   ??? BREAST SURGERY      left lumpectomy   ??? CESAREAN SECTION     ??? CHOLECYSTECTOMY     ??? COLONOSCOPY     ??? COLONOSCOPY  04/08/10   ??? CORONARY ANGIOPLASTY     ??? DIAGNOSTIC CARDIAC CATH LAB PROCEDURE     ??? DILATATION, ESOPHAGUS     ??? ENDOSCOPY, COLON, DIAGNOSTIC     ??? HYSTERECTOMY     ??? HYSTERECTOMY, VAGINAL     ??? LAP BAND  05/01/08    Dr. Nicole Cella   ??? OTHER SURGICAL HISTORY      [place] Filter: curently in place   ??? OTHER SURGICAL HISTORY  07/05/13    lap band removal   ??? SHOULDER SURGERY     ??? SKIN CANCER EXCISION  12/29/2017   ??? UPPER GASTROINTESTINAL ENDOSCOPY  05/19/13   ??? UPPER GASTROINTESTINAL ENDOSCOPY  07/21/13    ESOPHAGEAL STENT PLACEMENT   ??? UPPER GASTROINTESTINAL ENDOSCOPY N/A 07/31/2014    Esophagogastroduodenoscopy with esophageal balloon dilation   ??? VASCULAR SURGERY  04/2015    Lucendia Herrlich, celiac artery angiogram, normal abd arteries       Social History:    Social  History     Tobacco Use   ??? Smoking status: Former Smoker     Packs/day: 0.50     Years: 40.00     Pack years: 20.00     Types: Cigarettes     Quit date: 08/07/2012     Years since quitting: 8.7   ??? Smokeless tobacco: Former Systems developer     Quit date: 06/16/2013   ??? Tobacco comment: started to smoke at age 71 / only smoked 0.5 p.p.d    Substance Use Topics   ??? Alcohol use: No     Alcohol/week: 0.0 standard drinks                                Counseling given: Not Answered  Comment: started to smoke at age 44 / only smoked 0.5 p.p.d       Vital Signs (Current):  There were no vitals filed for this visit.                                           BP Readings from Last 3 Encounters:   02/27/21 110/72   08/29/20 138/60   05/17/20 111/65       NPO Status:                                                                                 BMI:   Wt Readings from Last 3 Encounters:   02/27/21 (!) 304 lb 2 oz (138 kg)   08/29/20 (!) 307 lb 3.2 oz (139.3 kg)   07/17/20 300 lb (136.1 kg)     There is no height or weight on file to calculate BMI.    CBC:   Lab Results   Component Value Date    WBC 6.8 02/13/2021    RBC 5.06 02/13/2021    HGB 14.1 02/13/2021    HCT 42.3 02/13/2021    MCV 83.5 02/13/2021    RDW 14.2 02/13/2021    PLT 170 02/13/2021       CMP:   Lab Results   Component Value Date    NA 136 02/13/2021    K 4.6 02/13/2021    K 4.6 10/17/2019    CL 102 02/13/2021    CO2 27 02/13/2021    BUN 20 02/13/2021    CREATININE 1.08 02/13/2021    GFRAA >60 10/18/2019    GFRAA >60 03/16/2012    AGRATIO 0.9 10/16/2019    LABGLOM 50 10/18/2019    GLUCOSE 119 02/13/2021    PROT 7.5 02/13/2021    PROT 7.5 02/13/2021    PROT 7.3 03/31/2011    CALCIUM 10.0 02/13/2021    BILITOT 0.7 02/13/2021    BILITOT 0.7 02/13/2021    ALKPHOS 71 02/13/2021    ALKPHOS 71 02/13/2021    AST 53 02/13/2021    AST 53 02/13/2021    ALT 47 02/13/2021    ALT 47 02/13/2021       POC Tests: No results for input(s): POCGLU, POCNA, POCK, POCCL, POCBUN, POCHEMO, POCHCT in  the last 72 hours.    Coags:   Lab Results   Component Value Date    PROTIME 12.8 02/13/2021    INR 1.0 02/13/2021    APTT 27.8 02/13/2021       HCG (If Applicable):   Lab Results   Component Value Date    PREGTESTUR Negative 09/09/2013        ABGs: No results found for: PHART, PO2ART, PCO2ART, HCO3ART, BEART, O2SATART     Type & Screen (If Applicable):  No results found for: LABABO, LABRH    Drug/Infectious Status (If Applicable):  No results found for: HIV, HEPCAB    COVID-19 Screening (If Applicable):   Lab Results   Component Value Date    COVID19 Not Detected 02/13/2021           Anesthesia Evaluation  Patient summary reviewed and Nursing notes reviewed no history of anesthetic complications:  Airway: Mallampati: III  TM distance: >3 FB   Neck ROM: full  Mouth opening: > = 3 FB Dental: normal exam         Pulmonary:normal exam  breath sounds clear to auscultation  (+) sleep apnea: on CPAP,  asthma (prn inh use, stable/no exacerbations):     (-) not a current smoker (former)                           Cardiovascular:    (+) hypertension:, CAD (cath 3/22 nonobst dz and patent stent; no issues since, no recent ntg use):, CABG/stent (S/P PTCA DES LAD 04/17):, CHF (chronic dCHF, echo 2021 EF 60-65; stable/compensated): diastolic, hyperlipidemia    (-) dysrhythmias and  angina    ECG reviewed  Rhythm: regular  Rate: normal  Echocardiogram reviewed  Stress test reviewed                Neuro/Psych:   (+) neuromuscular disease (peripheral neuropathy):,    (-) seizures, TIA and CVA           GI/Hepatic/Renal:   (+) GERD:, morbid obesity     (-) liver disease and no renal disease       Endo/Other: Negative Endo/Other ROS       (-) diabetes mellitus, hypothyroidism, hyperthyroidism               Abdominal:             Vascular:   + PVD, aortic or cerebral (celiac artery aneurysm, 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       Other Findings:             Anesthesia Plan      MAC     ASA 4        Induction: intravenous.      Anesthetic plan and risks discussed with patient.      Plan discussed with CRNA.                  Wilson Singer, MD   04/18/2021

## 2021-04-18 NOTE — Anesthesia Post-Procedure Evaluation (Signed)
Department of Anesthesiology  Postprocedure Note    Patient: Rachael Mays  MRN: [id number]  Birthdate: 11/06/1956  Date of evaluation: 04/18/2021  Time:  9:45 AM     Procedure Summary     Date: 04/18/21 Room / Location: [place] [facility] / [facility]    Anesthesia Start: [phone] Anesthesia Stop: 0918    Procedures:       EGD DILATION BALLOON (N/A Abdomen)      COLONOSCOPY DIAGNOSTIC (N/A ) Diagnosis:       (DYSPHAGIA R13.10)      (COLON CANCER SCREENING Z12.11)    Surgeons: Norwood Levo, MD Responsible Provider: Wilson Singer, MD    Anesthesia Type: MAC ASA Status: 4          Anesthesia Type: No value filed.    Aldrete Phase I: Aldrete Score: 10    Aldrete Phase II:      Last vitals: Reviewed and per EMR flowsheets.       Anesthesia Post Evaluation    Patient location during evaluation: PACU  Patient participation: complete - patient participated  Level of consciousness: awake and alert  Airway patency: patent  Nausea & Vomiting: no vomiting and no nausea  Complications: no  Cardiovascular status: hemodynamically stable  Respiratory status: acceptable  Hydration status: stable

## 2021-04-18 NOTE — Brief Op Note (Signed)
Brief Postoperative Note - Full Note in Chart Review/Procedures tab       Patient: Rachael Mays  Date of Birth: May 15, 1956  MRN: [id number]    Date of Procedure: 04/18/2021    Pre-Op Diagnosis: DYSPHAGIA R13.10  COLON CANCER SCREENING Z12.11    Post-Op Diagnosis: Same       Procedure(s):  EGD DILATION BALLOON  COLONOSCOPY DIAGNOSTIC    Surgeon(s):  Norwood Levo, MD    Assistant:  * No surgical staff found *    Anesthesia: Monitor Anesthesia Care    Estimated Blood Loss (mL): Minimal    Complications: None    Specimens:   * No specimens in log *    Implants:  * No implants in log *      Drains: * No LDAs found *    Findings:  1) Distal esophageal diverticulum    2) Post surgical deformity of proximal esophagus c/w previous lap band removal.    3) Empiric balloon dilation of distal esophagus performed    4) Mild sigmoid diverticulosis    5) Otherwise normal colon    Rec:  High fiber diet  Continue present treatment.  Trial of Linzess 145 mcg po daily  CT Chest and abdomen to evaluate chest pain.  Colonoscopy in 5 years  Followup in six months, or as needed  Followup with referring physician as previously instucted    Electronically signed by Norwood Levo, MD on 04/18/2021 at 9:13 AM

## 2021-04-18 NOTE — Progress Notes (Signed)
Esophagus was dilated incrementally with an 18-20 balloon. Negative for heme.

## 2021-04-18 NOTE — Progress Notes (Signed)
See anesthesia record for Propofol dosages and vital signs.

## 2021-04-18 NOTE — Progress Notes (Signed)
To endo recovery from procedure room. VSS, awakens to voice, respirations easy and unlabored.

## 2021-04-20 ENCOUNTER — Encounter

## 2021-04-21 MED ORDER — OMEPRAZOLE 40 MG PO CPDR
40 MG | ORAL_CAPSULE | ORAL | 1 refills | Status: DC
Start: 2021-04-21 — End: 2021-07-08

## 2021-05-09 NOTE — Telephone Encounter (Signed)
Pt says the spot is behind her left shoulder behind her back. She says it is red around the outside just like the other spots Dr. [name] took off for her. She says the spot itches. It is not raised. She noticed it last night. She wonders if she should be seen for this sooner. She has an appt in November.

## 2021-05-09 NOTE — Telephone Encounter (Signed)
Patient is calling to check status of previous call. Advised patient Dr [name] is out of the office for weekend. Please advise. Thank you!

## 2021-05-12 NOTE — Telephone Encounter (Signed)
LMTCB to scheduled appt.

## 2021-05-12 NOTE — Telephone Encounter (Signed)
Made appt for 4:45 pm tomorrow

## 2021-05-12 NOTE — Telephone Encounter (Addendum)
 Called and left message for patient to call back office.     Please offer opening for today (6/6) or 6/7 @ 4:45pm if still available.

## 2021-05-13 ENCOUNTER — Ambulatory Visit
Admit: 2021-05-13 | Discharge: 2021-05-13 | Payer: PRIVATE HEALTH INSURANCE | Attending: Dermatology | Primary: Student in an Organized Health Care Education/Training Program

## 2021-05-13 DIAGNOSIS — L539 Erythematous condition, unspecified: Secondary | ICD-10-CM

## 2021-05-13 NOTE — Progress Notes (Signed)
[facility]  Antonietta Breach, MD  [place]  [birth year]    65 y.o. female     Date of Visit: 05/13/2021    Chief Complaint: skin lesions    History of Present Illness:    1.  She complains of recent onset lesions on the left shoulder that are now healing.     2.  She also reports few persistent lesions on the chest.        Review of Systems:  Gen: Feels well, good sense of health.    Past Medical History, Family History, Surgical History, Medications and Allergies reviewed.    Past Medical History:   Diagnosis Date   ??? Anxiety    ??? Asthma    ??? Blood circulation, collateral    ??? CAD (coronary artery disease)    ??? CHF (congestive heart failure) ([place])    ??? Chronic back pain    ??? Deep vein thrombosis    ??? 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 Depression    ??? GERD (gastroesophageal reflux disease)     NO LONGER SINCE LAP   ??? GERD (gastroesophageal reflux disease) 01/23/2009   ??? Hx of blood clots    ??? Hyperlipidemia     hx; resolved with lap band   ??? Hypertension    ??? Idiopathic peripheral neuropathy 03/26/2017   ??? Melanoma ([place])     upper back   ??? MRSA (methicillin resistant staph aureus) culture positive 10/17/2019    leg abscess   ??? Obesity     hx of; had lap band   ??? Obstructive sleep apnea 09/18/2015     Updating Deprecated Diagnoses   ??? Obstructive sleep apnea (adult) (pediatric) 09/18/2015   ??? Pulmonary embolism [facility])      Past Surgical History:   Procedure Laterality Date   ??? BACK SURGERY      neck  plates   ??? BREAST SURGERY      left lumpectomy   ??? CESAREAN SECTION     ??? CHOLECYSTECTOMY     ??? COLONOSCOPY     ??? COLONOSCOPY  04/08/10   ??? COLONOSCOPY N/A 04/18/2021    COLONOSCOPY DIAGNOSTIC performed by Norwood Levo, MD at [facility]   ??? CORONARY ANGIOPLASTY     ??? DIAGNOSTIC CARDIAC CATH LAB PROCEDURE     ??? DILATATION, ESOPHAGUS     ??? ENDOSCOPY, COLON, DIAGNOSTIC     ??? HYSTERECTOMY (CERVIX STATUS UNKNOWN)     ??? HYSTERECTOMY, VAGINAL     ??? LAP  BAND  05/01/08    Dr. Nicole Cella   ??? OTHER SURGICAL HISTORY      [place] Filter: curently in place   ??? OTHER SURGICAL HISTORY  07/05/13    lap band removal   ??? SHOULDER SURGERY     ??? SKIN CANCER EXCISION  12/29/2017   ??? UPPER GASTROINTESTINAL ENDOSCOPY  05/19/13   ??? UPPER GASTROINTESTINAL ENDOSCOPY  07/21/13    ESOPHAGEAL STENT PLACEMENT   ??? UPPER GASTROINTESTINAL ENDOSCOPY N/A 07/31/2014    Esophagogastroduodenoscopy with esophageal balloon dilation   ??? UPPER GASTROINTESTINAL ENDOSCOPY N/A 04/18/2021    EGD DILATION BALLOON performed by Norwood Levo, MD at [place]   ??? VASCULAR SURGERY  04/2015    Lucendia Herrlich, celiac artery angiogram, normal abd arteries       Allergies   Allergen Reactions   ??? Ibuprofen    ??? Seasonal    ???  Talwin [Pentazocine] Other (See Comments)     dizzy     Outpatient Medications Marked as Taking for the 05/13/21 encounter (Office Visit) with Francisca December, MD   Medication Sig Dispense Refill   ??? omeprazole (PRILOSEC) 40 MG delayed release capsule TAKE ONE CAPSULE BY MOUTH DAILY 27 capsule 1   ??? cetirizine (ZYRTEC) 10 MG tablet Take 10 mg by mouth daily     ??? linaclotide (LINZESS) 145 MCG capsule Take 1 capsule by mouth every morning (before breakfast) 30 capsule 5   ??? magnesium oxide (MAG-OX) 400 MG tablet Take 400 mg by mouth daily     ??? gabapentin (NEURONTIN) 300 MG capsule TAKE TWO CAPSULES BY MOUTH ONCE NIGHTLY 60 capsule 5   ??? montelukast (SINGULAIR) 10 MG tablet TAKE ONE TABLET BY MOUTH DAILY NEED TO MAKE APPOINTMENT WITH DOCTOR 30 tablet 5   ??? calcium citrate-vitamin D (CITRICAL + D) 315-250 MG-UNIT TABS per tablet Take 2 tablets by mouth 2 times daily     ??? lactulose (CHRONULAC) 10 GM/15ML solution lactulose 10 gram/15 mL oral solution   TAKE 15MLS BY MOUTH EVERY DAY AS NEEDED     ??? ranolazine (RANEXA) 500 MG extended release tablet      ??? clotrimazole-betamethasone (LOTRISONE) 1-0.05 % cream Apply topically 2 times daily. 45 g 0   ??? desloratadine (CLARINEX) 5 MG tablet Take 1  tablet by mouth daily 90 tablet 1   ??? triamcinolone (KENALOG) 0.1 % cream APPLY TO AFFECTED AREA(S) TWO TIMES A DAY FOR UP TO 2 WEEKS OR UNTIL IMPROVED 80 g 0   ??? diclofenac sodium (VOLTAREN) 1 % GEL APPLY 4 GRAMS TO AFFECTED AREA(S) TWO TIMES A DAY 100 g 1   ??? fluocinonide (LIDEX) 0.05 % external solution APPLY SPARINGLY TO THE BACK OF THE SCALP DAILY AS NEEDED FOR ITCHING 60 mL 1   ??? ondansetron (ZOFRAN) 4 MG tablet TAKE ONE TABLET BY MOUTH THREE TIMES A DAY AS NEEDED FOR NAUSEA AND VOMITING 30 tablet 0   ??? meclizine (ANTIVERT) 25 MG tablet TAKE ONE TABLET BY MOUTH THREE TIMES A DAY AS NEEDED FOR DIZZINESS 15 tablet 0   ??? Cholecalciferol (VITAMIN D3) 50 MCG (2000 UT) CAPS Take 1 capsule by mouth daily 90 capsule 3   ??? lisinopril (PRINIVIL;ZESTRIL) 40 MG tablet TAKE ONE TABLET BY MOUTH DAILY 90 tablet 1   ??? spironolactone (ALDACTONE) 25 MG tablet TAKE ONE AND ONE-HALF TABLET BY MOUTH DAILY 38 tablet 0   ??? oxyCODONE-acetaminophen (PERCOCET) 5-325 MG per tablet      ??? rosuvastatin (CRESTOR) 10 MG tablet TAKE ONE TABLET BY MOUTH DAILY 90 tablet 3   ??? calcium-vitamin D (CALCIUM 500/D) 500-200 MG-UNIT per tablet Take 1 tablet by mouth daily     ??? acetic acid-hydrocortisone (VOSOL-HC) 1-2 % otic solution 4 drops in affected ear or ears every 3 hours while awake 15 mL 1   ??? albuterol sulfate HFA (PROVENTIL HFA) 108 (90 Base) MCG/ACT inhaler Inhale 2 puffs into the lungs every 6 hours as needed for Wheezing 3 Inhaler 3   ??? lidocaine (LIDODERM) 5 % Place 1 patch onto the skin daily 12 hours on, 12 hours off. 30 patch 0   ??? nitroGLYCERIN (NITROSTAT) 0.4 MG SL tablet up to max of 3 total doses. If no relief after 1 dose, call 911. 25 tablet 3   ??? torsemide (DEMADEX) 20 MG tablet Take 1 tablet by mouth daily as needed (swelling) 30 tablet 1   ???  aspirin 81 MG EC tablet Take 1 tablet by mouth daily 30 tablet 11   ??? Multiple Vitamins-Minerals (MULTIVITAMIN PO) Take 1 tablet by mouth daily            Physical Examination        Well appearing.     1.  Left superior shoulder with 2 small round excoriations.     2.  Chest and lateral cheeks - several stuck on appearing tan papules.          Assessment and Plan     1. Resolving lesions on the left shoulder    Reassurance.      2. SK (seborrheic keratosis) - several    Reassurance.          --Francisca December, MD

## 2021-05-15 ENCOUNTER — Encounter

## 2021-05-16 NOTE — Telephone Encounter (Signed)
Please see and advise  Thank you.

## 2021-05-16 NOTE — Telephone Encounter (Signed)
Rachael Mays does not feel like she needs to go to ER  Feels bloated, swelling in hands, feet, legs. Swelling worse as day goes on. Feels extremely fatigued.   Took Torsemide Monday took two tabs, one tab on Tuesday and Wednesday.  She is worried about her kidneys and taking too much Torsemide.  She states the inhaler has not helped. No obstructive CAD by [facility] 02/2021 at [facility].     Discussed with [facility]- have not seen in the office for over a year- cannot treat over the phone. Told her we can schedule next available NP here or she can contact PCP for an appointment next week. If symptoms significant enough go to ER. She verbalized understanding. She is scheduled with [facility] in July.

## 2021-05-16 NOTE — Telephone Encounter (Signed)
Pt called stating hands, feet, ankles and legs are getting swollen, fatigue, SOB and very tired. Pt stated she did take one torsemide it help little, then she took 2 however she stated it did not help as much as the 1 and she was afraid it would effect her kidneys.  Pt stated she is watching her sodium, pt is concern. Pt does have appt with [facility], however it is not until July, pt wants to know what she should do?    Pls advise thank you

## 2021-05-29 ENCOUNTER — Ambulatory Visit: Payer: PRIVATE HEALTH INSURANCE | Primary: Student in an Organized Health Care Education/Training Program

## 2021-06-10 NOTE — Telephone Encounter (Signed)
-----   Message from [place] sent at 06/10/2021  2:41 PM EDT -----  Subject: Referral Request    Reason for referral request? Pt needs a referral for neuropathy. Would   like referral sent to [facility] fax number [phone]  Provider patient wants to be referred to(if known):     Provider Phone Number(if known):    Additional Information for Provider?   ---------------------------------------------------------------------------  --------------  [place]    [phone]; OK to leave message on voicemail  ---------------------------------------------------------------------------  --------------

## 2021-06-10 NOTE — Telephone Encounter (Signed)
Medication is refused. It looks like the pt changed her cardiologist. Her new cardiologist is Sharyne Richters from [place]. Told the same to pharmacist

## 2021-06-10 NOTE — Telephone Encounter (Signed)
-----   Message from [place] sent at 06/10/2021  2:39 PM EDT -----  Subject: Appointment Request    Reason for Call: Established Patient Appointment needed: Urgent Joint Pain    QUESTIONS    Reason for appointment request? No appointments available during search     Additional Information for Provider? Pt is having severe hip pain. This   started on 06/06/21. Pt stated that it is hard to walk due to the pain  ---------------------------------------------------------------------------  --------------  [facility] [place] INFO  [phone]; OK to leave message on voicemail  ---------------------------------------------------------------------------  --------------  SCRIPT ANSWERS  COVID Screen: Nyoka Cowden

## 2021-06-11 ENCOUNTER — Encounter

## 2021-06-11 NOTE — Telephone Encounter (Signed)
Faxed referral and most recent MRI report to [facility] as requested.

## 2021-06-11 NOTE — Telephone Encounter (Signed)
Referral placed, please fax.

## 2021-06-18 ENCOUNTER — Encounter
Admit: 2021-06-18 | Discharge: 2021-06-18 | Payer: PRIVATE HEALTH INSURANCE | Attending: Cardiovascular Disease | Primary: Family

## 2021-06-18 DIAGNOSIS — I2510 Atherosclerotic heart disease of native coronary artery without angina pectoris: Secondary | ICD-10-CM

## 2021-06-18 MED ORDER — NITROGLYCERIN 0.4 MG SL SUBL
0.4 MG | ORAL_TABLET | SUBLINGUAL | 3 refills | Status: DC
Start: 2021-06-18 — End: 2022-06-03

## 2021-06-18 MED ORDER — RANOLAZINE ER 500 MG PO TB12
500 MG | ORAL_TABLET | Freq: Two times a day (BID) | ORAL | 3 refills | Status: DC
Start: 2021-06-18 — End: 2022-05-21

## 2021-06-18 NOTE — Patient Instructions (Addendum)
1.  No change in medications.  Will clarify all of your medications to make sure our list is accurate [facility])  2.  We may consider adjusting cholesterol dose.  LDL is 69, but would like LDL in the 50's if possible   3.  Schedule carotid duplex in December 2022  4.  Call our office with any concerning cardiac symptoms  5.  Follow up with Dr. Susy Manor in one year

## 2021-06-18 NOTE — Progress Notes (Signed)
[place]    06/18/2021    Rachael Mays (DOB:  September 11, 1956) is a 65 y.o. female is here for follow up and management of CAD and modifiable risk factors.    Referring Provider: Romona Curls, APRN - NP    HISTORY:  Rachael Mays has a history of coronary artery disease s/p DES of LAD (03/2016), hypertension, hyperlipidemia, diastolic heart failure, and carotid artery stenosis.  She has chronic chest pain and shortness of breath that did not resolve after coronary intervention in 03/2016.  RHC (2017) showed PA mean 33 mmHg, PCWP 18 mmHg (transpulmonary gradient 14), PVR 2.134 Wood units.     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      She most recently followed with cardiologist Dr. Latricia Heft through [facility].  Due to intermittent chest heaviness and pressure which was worse even with low levels of exertion and usually relieved with rest, she underwent nuclear stress test that showed ischemia subtended by LAD. 02/18/21 LHC showed patent proximal LAD stent with mid 30% stenosis in some views and FFR 0.89.  Medical management was recommended.      On 04/18/21, she had diagnostic colonoscopy and EGD  Balloon dilatation of the distal esophagus by Dr. Earlie Server.      Today,     REVIEW OF SYSTEMS:  A complete review of systems was reviewed and is negative except as noted in the history of present illness.    Prior to Visit Medications    Medication Sig Taking? Authorizing Provider   gabapentin (NEURONTIN) 600 MG tablet 1,200 mg nightly as needed.  Yes Historical Provider, MD   nitroGLYCERIN (NITROSTAT) 0.4 MG SL tablet up to max of 3 total doses. If no relief after 1 dose, call 911. Yes Leodis Binet, MD   ranolazine (RANEXA) 500 MG extended release tablet Take 1 tablet by mouth 2 times daily Yes Leodis Binet, MD   cetirizine (ZYRTEC) 10 MG tablet Take 10 mg by mouth daily Yes Historical Provider, MD   magnesium oxide (MAG-OX)  400 MG tablet Take 400 mg by mouth daily Yes Historical Provider, MD   gabapentin (NEURONTIN) 300 MG capsule TAKE TWO CAPSULES BY MOUTH ONCE NIGHTLY  Patient taking differently: 3 times daily as needed.  Yes [name] Leigh Wojciechowski, APRN - NP   montelukast (SINGULAIR) 10 MG tablet TAKE ONE TABLET BY MOUTH DAILY NEED TO MAKE APPOINTMENT WITH DOCTOR Yes [name] Leigh Wojciechowski, APRN - NP   calcium citrate-vitamin D (CITRICAL + D) 315-250 MG-UNIT TABS per tablet Take 2 tablets by mouth 2 times daily Yes Historical Provider, MD   lactulose (CHRONULAC) 10 GM/15ML solution lactulose 10 gram/15 mL oral solution   TAKE 15MLS BY MOUTH EVERY DAY AS NEEDED Yes Historical Provider, MD   clotrimazole-betamethasone (LOTRISONE) 1-0.05 % cream Apply topically 2 times daily. Yes [name] Leigh Wojciechowski, APRN - NP   desloratadine (CLARINEX) 5 MG tablet Take 1 tablet by mouth daily Yes [name] Leigh Wojciechowski, APRN - NP   triamcinolone (KENALOG) 0.1 % cream APPLY TO AFFECTED AREA(S) TWO TIMES A DAY FOR UP TO 2 WEEKS OR UNTIL IMPROVED Yes Francisca December, MD   diclofenac sodium (VOLTAREN) 1 % GEL APPLY 4 GRAMS TO AFFECTED AREA(S) TWO TIMES A DAY Yes Jamal Collin, MD   fluocinonide (LIDEX) 0.05 % external solution APPLY SPARINGLY TO THE BACK OF THE SCALP DAILY AS NEEDED FOR ITCHING Yes Carola Rhine  [name], MD   ondansetron (ZOFRAN) 4 MG tablet TAKE ONE TABLET BY MOUTH THREE TIMES A DAY AS NEEDED FOR NAUSEA AND VOMITING Yes [name] Leigh Wojciechowski, APRN - NP   meclizine (ANTIVERT) 25 MG tablet TAKE ONE TABLET BY MOUTH THREE TIMES A DAY AS NEEDED FOR DIZZINESS Yes [name] Leigh Wojciechowski, APRN - NP   Cholecalciferol (VITAMIN D3) 50 MCG (2000 UT) CAPS Take 1 capsule by mouth daily Yes [name] Leigh Wojciechowski, APRN - NP   lisinopril (PRINIVIL;ZESTRIL) 40 MG tablet TAKE ONE TABLET BY MOUTH DAILY Yes [name] Leigh Wojciechowski, APRN - NP   spironolactone (ALDACTONE) 25 MG tablet TAKE ONE AND ONE-HALF TABLET BY MOUTH  DAILY Yes [name] Leigh Wojciechowski, APRN - NP   oxyCODONE-acetaminophen (PERCOCET) 5-325 MG per tablet  Yes Historical Provider, MD   rosuvastatin (CRESTOR) 10 MG tablet TAKE ONE TABLET BY MOUTH DAILY Yes Luiz Iron, APRN - CNP   calcium-vitamin D (CALCIUM 500/D) 500-200 MG-UNIT per tablet Take 1 tablet by mouth daily Yes Historical Provider, MD   acetic acid-hydrocortisone (VOSOL-HC) 1-2 % otic solution 4 drops in affected ear or ears every 3 hours while awake Yes Alvina Chou, MD   albuterol sulfate HFA (PROVENTIL HFA) 108 (90 Base) MCG/ACT inhaler Inhale 2 puffs into the lungs every 6 hours as needed for Wheezing Yes Almira Coaster, APRN - CNP   lidocaine (LIDODERM) 5 % Place 1 patch onto the skin daily 12 hours on, 12 hours off. Yes Sharmaine Base Wanninger, PA-C   torsemide (DEMADEX) 20 MG tablet Take 1 tablet by mouth daily as needed (swelling) Yes Luiz Iron, APRN - CNP   aspirin 81 MG EC tablet Take 1 tablet by mouth daily Yes Harvel Quale, MD   Multiple Vitamins-Minerals (MULTIVITAMIN PO) Take 1 tablet by mouth daily  Yes Historical Provider, MD   omeprazole (PRILOSEC) 40 MG delayed release capsule TAKE ONE CAPSULE BY MOUTH DAILY  [name] Leigh Wojciechowski, APRN - NP   linaclotide (LINZESS) 145 MCG capsule Take 1 capsule by mouth every morning (before breakfast)  Patient not taking: Reported on 06/18/2021  Norwood Levo, MD        Allergies   Allergen Reactions   ??? Ibuprofen    ??? Seasonal    ??? Rachael Mays [Pentazocine] Other (See Comments)     dizzy       Past Medical History:   Diagnosis Date   ??? Anxiety    ??? Asthma    ??? Blood circulation, collateral    ??? CAD (coronary artery disease)    ??? CHF (congestive heart failure) ([place])    ??? Chronic back pain    ??? Deep vein thrombosis    ??? 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 Depression    ??? GERD (gastroesophageal reflux disease)     NO LONGER SINCE LAP   ??? GERD (gastroesophageal reflux disease) 01/23/2009   ??? Hx of blood  clots    ??? Hyperlipidemia     hx; resolved with lap band   ??? Hypertension    ??? Idiopathic peripheral neuropathy 03/26/2017   ??? Melanoma ([place])     upper back   ??? MRSA (methicillin resistant staph aureus) culture positive 10/17/2019    leg abscess   ??? Obesity     hx of; had lap band   ??? Obstructive sleep apnea 09/18/2015     Updating Deprecated Diagnoses   ??? Obstructive sleep apnea (adult) (pediatric) 09/18/2015   ??? Pulmonary  embolism ([place])    ??? Stenosis of right carotid artery 06/16/2021       Past Surgical History:   Procedure Laterality Date   ??? BACK SURGERY      neck  plates   ??? BREAST SURGERY      left lumpectomy   ??? CESAREAN SECTION     ??? CHOLECYSTECTOMY     ??? COLONOSCOPY     ??? COLONOSCOPY  04/08/10   ??? COLONOSCOPY N/A 04/18/2021    COLONOSCOPY DIAGNOSTIC performed by [facility]wood [facility], MD at [facility]   ??? CORONARY ANGIOPLASTY     ??? DIAGNOSTIC CARDIAC CATH LAB PROCEDURE     ??? DILATATION, ESOPHAGUS     ??? ENDOSCOPY, COLON, DIAGNOSTIC     ??? HYSTERECTOMY (CERVIX STATUS UNKNOWN)     ??? HYSTERECTOMY, VAGINAL     ??? LAP BAND  05/01/08    Dr. Nicole Cella   ??? OTHER SURGICAL HISTORY      [place]field Filter: curently in place   ??? OTHER SURGICAL HISTORY  07/05/13    lap band removal   ??? SHOULDER SURGERY     ??? SKIN CANCER EXCISION  12/29/2017   ??? UPPER GASTROINTESTINAL ENDOSCOPY  05/19/13   ??? UPPER GASTROINTESTINAL ENDOSCOPY  07/21/13    ESOPHAGEAL STENT PLACEMENT   ??? UPPER GASTROINTESTINAL ENDOSCOPY N/A 07/31/2014    Esophagogastroduodenoscopy with esophageal balloon dilation   ??? UPPER GASTROINTESTINAL ENDOSCOPY N/A 04/18/2021    EGD DILATION BALLOON performed by Norwood Levo, MD at [place]   ??? VASCULAR SURGERY  04/2015    Lucendia Herrlich, celiac artery angiogram, normal abd arteries       Social History     Tobacco Use   ??? Smoking status: Former Smoker     Packs/day: 0.50     Years: 40.00     Pack years: 20.00     Types: Cigarettes     Quit date: 08/07/2012     Years since quitting: 8.8   ??? Smokeless tobacco: Former Systems developer      Quit date: 06/16/2013   ??? Tobacco comment: started to smoke at age 42 / only smoked 0.5 p.p.d    Substance Use Topics   ??? Alcohol use: No     Alcohol/week: 0.0 standard drinks        Family History   Problem Relation Age of Onset   ??? Arthritis Mother    ??? Cancer Mother    ??? Depression Mother    ??? High Blood Pressure Mother    ??? Heart Disease Mother 39        MI    ??? Ovarian Cancer Mother 22   ??? Cancer Father 58        colon   ??? Heart Disease Maternal Grandmother    ??? Early Death Paternal Grandmother    ??? Heart Disease Paternal Grandfather    ??? Diabetes Other    ??? High Blood Pressure Other    ??? Obesity Other    ??? Other Sister         OSA   ??? Other Brother         OSA   ??? Other Brother         OSA   ??? Other Daughter         OSA       PHYSICAL EXAMINATION:  Vitals:    06/18/21 1323   BP: 114/68   Pulse: 78   SpO2: 97%  Weight: (!) 311 lb (141.1 kg)   Height: 5\' 5"  (1.651 m)     Estimated body mass index is 51.75 kg/m?? as calculated from the following:    Height as of this encounter: 5\' 5"  (1.651 m).    Weight as of this encounter: 311 lb (141.1 kg).    Physical Exam  Constitutional:       Appearance: She is well-developed. She is not diaphoretic.   HENT:      Head: Normocephalic and atraumatic.   Eyes:      General: No scleral icterus.     Extraocular Movements: Extraocular movements intact.      Conjunctiva/sclera: Conjunctivae normal.   Neck:      Vascular: No JVD.   Cardiovascular:      Rate and Rhythm: Normal rate and regular rhythm.      Heart sounds: Normal heart sounds. No murmur heard.  No friction rub. No gallop.    Pulmonary:      Effort: Pulmonary effort is normal. No respiratory distress.      Breath sounds: Normal breath sounds. No wheezing or rales.   Abdominal:      General: Bowel sounds are normal.      Palpations: Abdomen is soft.      Tenderness: There is no abdominal tenderness.   Musculoskeletal:         General: Normal range of motion.      Cervical back: Normal range of motion and neck supple.    Skin:     General: Skin is warm and dry.      Findings: No rash.   Neurological:      General: No focal deficit present.      Mental Status: She is alert and oriented to person, place, and time.           I have reviewed all pertinent lab results and diagnostic testing.        LABS:  CBC:   Lab Results   Component Value Date/Time    WBC 6.8 02/13/2021 03:02 PM    RBC 5.06 02/13/2021 03:02 PM    HGB 14.1 02/13/2021 03:02 PM    HCT 42.3 02/13/2021 03:02 PM    MCV 83.5 02/13/2021 03:02 PM    RDW 14.2 02/13/2021 03:02 PM    PLT 170 02/13/2021 03:02 PM     CMP:   Lab Results   Component Value Date/Time    NA 136 02/13/2021 03:02 PM    K 4.6 02/13/2021 03:02 PM    K 4.6 10/17/2019 06:07 AM    CL 102 02/13/2021 03:02 PM    CO2 27 02/13/2021 03:02 PM    BUN 20 02/13/2021 03:02 PM    CREATININE 1.08 02/13/2021 03:02 PM    GFRAA >60 10/18/2019 05:42 AM    GFRAA >60 03/16/2012 03:42 PM    AGRATIO 0.9 10/16/2019 04:55 PM    LABGLOM 50 10/18/2019 05:42 AM    GLUCOSE 119 02/13/2021 03:02 PM    PROT 7.5 02/13/2021 03:02 PM    PROT 7.5 02/13/2021 03:02 PM    PROT 7.3 03/31/2011 03:07 PM    CALCIUM 10.0 02/13/2021 03:02 PM    BILITOT 0.7 02/13/2021 03:02 PM    BILITOT 0.7 02/13/2021 03:02 PM    ALKPHOS 71 02/13/2021 03:02 PM    ALKPHOS 71 02/13/2021 03:02 PM    AST 53 02/13/2021 03:02 PM    AST 53 02/13/2021 03:02 PM    ALT 47 02/13/2021 03:02 PM  ALT 47 02/13/2021 03:02 PM     Lab Results   Component Value Date/Time    CHOL 149 02/13/2021 03:02 PM    TRIG 116 02/13/2021 03:02 PM    HDL 48 02/13/2021 03:02 PM    HDL 52 03/31/2011 03:07 PM    LDLCALC 78 02/13/2021 03:02 PM       PT/INR: No results found for: PTINR     Cardiac cath 02/18/21 ([facility])   Angiographic Findings: ??Right dominant system   Left Main: ??Normal     Left Anterior Descending: ??Patent proximal stent. Mid 30% stenosis,   irregular appearing in some views. FFR 0.89.     Circumflex: ??No obstructive disease     Right Coronary: ??No obstructive disease     Left  Ventriculogram: ??LVEF 55-60%     Hemodynamics (mm Hg):   Left Ventricular Pressure: ??102/49   Central Aortic Pressure: ??125/0, 6     Conclusions:   -No obstructive CAD     Recommendations:   -Follow-up with GI   -Continue medical therapy         Lexiscan myoview 01/29/21 ([facility])  Interpetation Summary:   1. No no change of baseline chest pain after regadenoson injection.   2. No evidence of stress-induced ischemia on EKG.   3. There is evidence of ischemia in the territory subtended by the left anterior descending coronary artery.   4. Normal LV systolic function.        Carotid duplex 11/12/20: [facility])   ???? o 50-69% stenosis of the right internal carotid artery based on velocity   ???? ?? criteria.   ???? o No hemodynamically significant lesions are identified on the left   ???? ?? internal or common carotid(s).   ???? o There is antegrade flow demonstrated in the right vertebral artery.   ???? o The left vertebral artery could not be visualized.     Echocardiogram 05/27/20: ([facility])     The left ventricle is normal in size.   There is normal left ventricular wall thickness.   Left ventricular systolic function is normal. (LVEF>/=55%)   Overall left ventricular ejection fraction is estimated to be 60-65%.   Normal Diastolic Function.   No significant valvular abnormalities identified.         Procedure:          LHC 08/09/19  Indication:          Unstable angina  ??  Procedure Details:  Consent Access Bleed R Sedat Start Stop Versed Fentanyl Contrast Fluoro EBL Comp Spec   Yes RRA low MCSFC 1457 1522 4 150 51 1.9 <20 None None   *Ultrasound Note: Ultrasound guidance used to determine aforementioned artery patency, size (>25mm), anatomic variations and ideal puncture location.  Real-time ultrasound utilized concurrent with vascular needle entry into the artery.  Image(s) permanently recorded and reported in the patient chart.  *Sedation Note: MCSFC = minimal conscious sedation for comfort  ??  Findings:  Artery  Findings/Result   LM Normal   LAD Widely patent stent, 50% ostial D1 jailed by stent (no change)   Cx Normal   RI NA   RCA Normal   LVEDP 6   LVG 60%   ??  Intervention:         None  ??  Post Cath Dx:       Nonobstructive CAD, patent stent LAD    Cardiac Cath 05/07/17:  Anatomy:   LM-normal  LAD-normal, widely patent stent mid  Cx-normal  OM1- normal  RCA-normal, right dominant  RPDA- normal  LVEF- 60%  ??  Impression:  1.  Normal coronary artery anatomy.  2.  Widely patent mid LAD stent.  3.  Normal LV systolic function.  4.  False positive stress test.  Likely breast artifact.  ??  Plan:  1.  Medical management.  2.  She needs workup of noncardiac causes of chest pain.  She now has to stress test with similar abnormalities most consistent with breast artifact.  The majority of her coronary angiograms have demonstrated normal anatomy except for the one that had a 70% mid LAD stenosis which was stented.  Her chest pain has atypical features.  Recommend rheumatology and possibly orthopedic evaluation.    Echo 08/05/16 ([facility], [place])  LV with normal systolic function,  EF 16-10%  Mild MR with multiple jets  Very mild AS  Mild TR      Cardiac Cath 07/17/16:  Anatomy:   LM-normal  LAD-widely patent stent otherwise normal  Cx-normal, dominant  OM1- normal  RCA-normal  LVEF- 70%, LVEDP 15 mmHg  ??  Impression:  1.  Widely patent LAD stent otherwise normal coronary arteries  2.  Hyperdynamic LV systolic function  3.  Non-cardiac chest pain  ??  Plan:  1.  Medical management    Echo 04/06/16:   Summary   Normal left ventricle size and systolic function with an estimated ejection   fraction of 65%.   There is mild concentric left ventricular hypertrophy.   Mild mitral regurgitation is present.   The aortic valve appears sclerotic but opens well.   There is mild tricuspid regurgitation with RVSP estimated at 42 mmHg.   Mild pulmonic regurgitation present.   Definity was used to better delineate endocardium.    Cardiac Cath  03/31/16:  Anatomy:   LM-normal   LAD-70% mid with FFR 0.77  Cx-normal, codominant  OM1- normal  RCA-normal  RPDA- normal  LVEF- 70%, LVEDP 19   PCI: LAD 70% to 0% with 3.25 mm x 18 mm Xience Alpine drug-eluting stent  ??  Hemodynamics:  RA-15/12 mean 9  RV- 60/0, 9  PAWP-20/26 mean 19  PA- 60/12 mean 33  ??  ??  Impression:  1.  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  2.  Hyperdynamic LV systolic function.  3.  Mildly decompensated diastolic CHF.  4.  Moderate pulmonary hypertension.  ??  Plan:  1.  Aspirin indefinitely.  2.  Effient/equivalent for minimum of 1 year.  3.  IV diuresis while in the hospital and then discharged on Lasix 40 mg by mouth twice a day.  4.  Continue BiPAP therapy.  5.  Outpatient cardiac rehab.  6.  CHF service follow-up on discharge.  ??    Cardiac cath 09/23/15: normal coronary arteries      ASSESSMENT/PLAN:  1. Coronary artery disease due to lipid rich plaque  - 03/31/16 LHC - mid LAD 70% stenosis with FFR 0.77.  S/p DES x 1. Remainder of arteries normal  - 07/17/16 LHC - patent stent, otherwise normal coronary arteries  - 05/07/17 LHC - patent stent, otherwise normal coronary arteries (false positive nuclear stress test  - 08/09/19 LHC - widely patent LAD stent, 50% ostial D1 jailed by stent (no change)  - 01/29/21 SPECT - ischemia in LAD territory  - 02/18/21 LHC - right dominant system.  Patent proximal LAD stent, mid 30% stenosis, irregular appearing in some views. FFR  0.89.  Medical management.  Started on Ranexa  - meds : Ranexa, ASA, statin     Plan:  Stable.  Continue current medical regimen.    2. Chronic diastolic congestive heart failure ([place])  3. Pulmonary hypertension  - 03/2016 Right heart cath - mildly decompensated heart failure with moderate pulmonary hypertension (PAWP-20/26 mean 19 ; PA- 60/12 mean 33)   - 04/06/16 Echo - EF 65%, mild CLVH, RVSP 42 mmHg  - 05/27/20 Echo - EF 20-25%, normal diastolic function, function is normal.  (LVEF>/=55%)   Overall left ventricular ejection fraction is estimated to be 60-65%.   normal diastolic function, normal RV systolic fx  - meds: Lisinopril, spironolactone, torsemide    Plan :  Stable.  Continue current medical regimen.      4. Essential hypertension  - Blood pressure 114/68, pulse 78, height 5\' 5"  (1.651 m), weight (!) 311 lb (141.1 kg), SpO2 97 %, not currently breastfeeding.    Plan : Stable.  Continue current medical regimen.    5. Dyslipidemia  - Rosuvastatin 10 mg daily.  - 02/17/21 - TC- 141, TG- 127, HDL- 47, LDL- 69.  AST- 47, ALT- 47    Plan :  Would like Crestor 20 mg daily if previously taking 10 mg daily.    6. Stenosis of right carotid artery  - 11/12/20 carotid duplex ([facility]) - RICA with 50-69% stenosis of RICA, no significant plaque in Rt common carotid artery.    No hemodynamically significant stenosis of left ICA or common carotid artery.     Plan : Carotid duplex around December 2022.    Plan :  1.  Carotid duplex.  2.  Call about Crestor dosing.  3.  RTC in 1 year.      Return in about 1 year (around 06/18/2022).    An  electronic signature was used to authenticate this note.    Stephannie Li. Susy Manor, MD, [facility], RPVI    Scribe's attestation:  This note was scribed in the presence of Leodis Binet, M.D. by Wynell Balloon, RN    Physician Attestation: The scribe's documentation has been prepared under my direction and personally reviewed by me in its entirety.  I confirm that the note above accurately reflects all work, treatment, procedures, and medical decision making performed by me.

## 2021-06-19 NOTE — Telephone Encounter (Signed)
Pt called back and gave updated list of med and I updated them on epic.

## 2021-06-19 NOTE — Telephone Encounter (Signed)
Patient was seen by Dr. Susy Manor in office on 06/18/21.  Patient did not bring an updated medication list to the visit and we discussed the need to review medications after her office visit.    Left voice mail message with patient asking for call back to review current medications.  If patient calls back on 06/19/21, she is to ask for [facility].

## 2021-06-20 NOTE — Care Coordination-Inpatient (Signed)
ACM made outreach to patient for Care Management Enrollment from [facility] List. Patient has declined at this time, but will make outreach to [facility] with any future needs.

## 2021-06-23 NOTE — Telephone Encounter (Signed)
Patient calling to advise [name] that Rachael Mays is needing an order for either an MRI or a cat scan for her back in order to get her treatment started. Call back number [phone]. Please advise

## 2021-06-25 NOTE — Telephone Encounter (Signed)
Called Pt, no answer, LVM. We need more info on what [facility] and what they want.

## 2021-06-25 NOTE — Telephone Encounter (Signed)
Pt returned call, had MRI at [facility]. Advised Pt to contact [facility] to get copy of imaging to give to [facility]. Stated she understood without further questions.

## 2021-07-07 ENCOUNTER — Encounter

## 2021-07-08 ENCOUNTER — Encounter

## 2021-07-08 MED ORDER — OMEPRAZOLE 40 MG PO CPDR
40 MG | ORAL_CAPSULE | ORAL | 3 refills | Status: DC
Start: 2021-07-08 — End: 2021-07-10

## 2021-07-08 NOTE — Telephone Encounter (Signed)
The original prescription was discontinued on 04/03/2020 by Ronelle Nigh, MD for the following reason: LIST CLEANUP. Renewing this prescription may not be appropriate

## 2021-07-09 ENCOUNTER — Encounter

## 2021-07-09 LAB — HEMOGLOBIN A1C: Hemoglobin A1C: 7.2 %

## 2021-07-10 MED ORDER — OMEPRAZOLE 40 MG PO CPDR
40 MG | ORAL_CAPSULE | ORAL | 3 refills | Status: DC
Start: 2021-07-10 — End: 2022-01-26

## 2021-07-17 NOTE — Progress Notes (Unsigned)
History of Present Illness  Rachael Mays is a 65 year old female who is here today because of leg pain.    The onset of this problem was gradual, and the symptoms have been occurring in   a continuous pattern for 1+ years.  The course has been gradually worsening   into right leg 8-9 months.  The severity of the pain is reported to be   moderate to severe, and it radiates to the right leg.  The pain is described   as being located lower back, right hip, right buttock, right upper leg, right   shin/calf, in the left buttock, in the right posterior thigh, in the right   calf, in the right foot.  The pain is characterized as a dull ache.  The pain   is relieved by changing positions and is aggravated by prolonged sitting and   walking.  Associated features include numbness and tingling in both legs down   posterior aspect to feet and leg weakness.  Previous evaluations include MRI   and neurologist.  PT made symptoms worse.    Initailly was left leg now right leg with numbness in both legs.        Review of Histories and Systems   I reviewed the patient's past medical history, social history, family history,   and review of systems. The patient recorded this information in our chart and   I reviewed it personally.      Physical Exam   General   General appearance: well groomed, cooperative  Build and nutrition: morbidly obese  Posture: shoulders slightly rounded    Eyes  Visual fields: grossly intact by confrontation  EOM: extraocular movements intact, normal gaze alignment      Musculoskeletal   Gait and station: slow and cautious, limping  Cervical palpation/inspection: normal  Lumbar strength: normal  Lumbar ROM: ROM decreased  Lumbar palpation/inspection: tenderness  Straight leg raise right: negative  Straight leg raise left: negative  RUE muscle strength: 5/5 strength throughout  RUE muscle tone: normal  LUE muscle strength: 5/5 strength throughout  LUE muscle tone: normal  RLE muscle strength: 5/5  throughout  RLE muscle tone: normal  LLE muscle strength: 5/5 throughout  LLE muscle tone: normal      Neurologic   DTR Right: normal except patellar 0/2  DTR Left: 2+ symmetric  Cerebellar function: normal    Sensory   Light touch: normal  Paresthesias: left leg, right leg    Cranial Nerves  5th (Trigeminal): normal corneal reflex  7th (Facial): no weakness  8th (Auditory): normal hearing with whispered voice or finger rub  9th (Glossophar): gag reflex normal  11th (Spinal access): no atrophy, normal strength bilaterally  12th (Hypoglossal): tongue midline on protrusion, no fasciculations    Mental Status Exam   Affect: normal  Orientation: oriented to time, place, and person  Attention span/Concentration/Cognitive function: all normal  Memory: normal  Speech/language: normal  Fund of knowledge: able to name months, seasons, current president  Thought content: normal        Assessment   New Problems:   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  Symptoms initially were pain and numbness in her left   leg but then last year began going down her right leg in a L5 dermatomal   pattern.  She was seen by a neurologist and started in physical therapy  which   she completed about 3 months ago.  The therapy only made her symptoms worse.    Her pain is into her buttocks and down her right leg.  She has numbness and   tingling in both lower extremities.  She reports her cardiologist informed her   that he does not recommend epidural steroid injections in her back given her   congestive heart failure.  Given her ongoing symptoms she is referred for   neurosurgical evaluation.    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          Plan   New Orders:  MRI Lumbar without contrast [[id number]]  Additional Plan Details: Her  MRI is outdated, unsure if disc herniation is   still present.  Patient was advised given her morbid obesity, with BMI over   50, surgery is likely contraindicated given high perioperative morbidity, risk   of postop infection and increased risk of postoperative spondylolisthesis.  Disposition: telephone-follow up          Electronically Signed by Juliane Poot NP on 07/21/2021 at 11:57 AM  ________________________________________________________________________

## 2021-08-07 ENCOUNTER — Encounter

## 2021-08-07 MED ORDER — LISINOPRIL 40 MG PO TABS
40 MG | ORAL_TABLET | ORAL | 1 refills | Status: DC
Start: 2021-08-07 — End: 2021-10-14

## 2021-08-13 ENCOUNTER — Ambulatory Visit: Payer: PRIVATE HEALTH INSURANCE | Primary: Family

## 2021-08-18 MED ORDER — FLUOCINONIDE 0.05 % EX SOLN
0.05 % | CUTANEOUS | 1 refills | Status: DC
Start: 2021-08-18 — End: 2022-05-05

## 2021-09-01 ENCOUNTER — Encounter: Attending: Family | Primary: Family

## 2021-09-02 ENCOUNTER — Encounter

## 2021-09-02 MED ORDER — MONTELUKAST SODIUM 10 MG PO TABS
10 MG | ORAL_TABLET | ORAL | 5 refills | Status: AC
Start: 2021-09-02 — End: ?

## 2021-09-03 NOTE — Telephone Encounter (Signed)
Patient received machine from [facility] and states her nose has been very dry ever since starting on the new one, please advise and call patient back at [phone].

## 2021-09-03 NOTE — Telephone Encounter (Signed)
This pt has never seen Dr. Posey Pronto or Leveda Anna here at [facility] is this the correct pt?

## 2021-09-04 NOTE — Telephone Encounter (Signed)
Last saw Tiffanie in 2020, I will call to get in for a follow up.

## 2021-09-04 NOTE — Telephone Encounter (Signed)
LM for patient to schedule in-office, FU.

## 2021-09-05 ENCOUNTER — Telehealth

## 2021-09-05 NOTE — Telephone Encounter (Signed)
Called and spoke with patient.  She describes 3 weeks of chest discomfort.  Was seen in [facility] and work-up was unremarkable from cardiac standpoint.  She does have significant GI history as well.  Ultimately what we decided on is to repeat her cardiac catheterization to reassess coronary anatomy and provide FFR testing if necessary.  We also discussed the possibility of referring her to a place that can do CFR testing.  She will increase her Ranexa to 1000 mg twice daily until the time of cardiac cath.  If cardiac cath is unremarkable, we would refer back to GI.

## 2021-09-09 NOTE — Addendum Note (Signed)
Addended by: Wynell Balloon on: 09/09/2021 02:34 PM     Modules accepted: Orders

## 2021-09-09 NOTE — Addendum Note (Signed)
Addended by: Wynell Balloon on: 09/09/2021 02:29 PM     Modules accepted: Orders

## 2021-09-09 NOTE — Telephone Encounter (Signed)
Updated medication list.  Patient is now taking Ranexa 500 mg two tabs twice a day.  Spironolactone 25 mg daily (not 1.5 tabs daily).      Dr. Susy Manor reviewed [place] procedure with patient and she voiced understanding of information discussed on 09/05/21.  Preps for procedure reviewed including NPO (food and drink) after midnight or at least 8 hours before scheduled procedure.  Okay to take morning medications with enough water to swallow pills.  Patient takes Torsemide as needed, patient instructed to skip diuretic that day of the procedure.  Patient should take Trulicity as scheduled.  Reviewed post procedure activity and driving restriction.  No concerning allergies for angiogram.  Patient voiced understanding of all information discussed.      Missy, please call patient to schedule LHC, possible PCI.

## 2021-09-11 NOTE — Telephone Encounter (Signed)
Patient scheduled for LHC on 10/14/21 at 9:30 am with an arrival of 8 am.

## 2021-10-03 NOTE — Progress Notes (Signed)
Patient reached __X__ yes  _____ no   VM instructions left ____ yes   phone number ________                                ____ no-office notified          Date _11/3/22________  Time _0915______  Arrival __0745  hosp-endo____    Nothing to eat or drink after midnight-follow your doctors prep instructions-this may include taking a second dose of your prep after midnight  Responsible adult 78 or older to stay on site while you are here-drive you home-stay with you after  Follow any instructions your doctors office has given you  Bring a complete list of all your medications and supplements including name,dose,how often taken the day of your procedure  If you normally take the following medications in the morning please do so the AM of your procedure with a small sip of water       Heart,blood pressure,seizure,thyroid or breathing medications-use your inhalers-bring any rescue inhalers with you DOS       DO NOT take blood pressure medications ending in "artan" or "pril" the AM of procedure or evening prior  Take half or your normal dose of any long acting insulins the night before your procedure-do not take any diabetic medications the AM of procedure  Follow your doctors instructions regarding stopping or taking  any blood thinners-if you do not have instructions-call them  Any questions call your doctor  Other ________take ranexa am of procedure______________________________________________________      [facility] POLICY(subject to change)             The current policy is 2 visitors per patient.There are no children allowed.Mask at discretion of facility. Visiting hours are 8a-8p.Overnight visitors will be at the discretion of the nurse. All policies are subject to change.

## 2021-10-06 NOTE — Progress Notes (Signed)
Call to [facility], at [facility], to see if pt.is ok to have EGD prior to cardiac cath. She has been having recent episodes of chest pain.

## 2021-10-06 NOTE — Telephone Encounter (Signed)
Per [facility] patient should hold off on EGD given active chest pain and re address once LHC is completed on 10/14/21. Called Dr Earlie Server office and let them know that it should be deferred at this time. Call back number given in case they have further questions.

## 2021-10-06 NOTE — Telephone Encounter (Signed)
CARDIAC CLEARANCE REQUEST    What type of procedure are you having: EGD    Are you taking any blood thinners: no    When is your procedure scheduled for: 10/09/21    What physician is performing your procedure: Dr. Hassell Done    Phone Number: [phone]    Fax number to send the letter:     Patient is having chest pain.  OK to do EGD before doing the cardiac cath?    pjp

## 2021-10-13 ENCOUNTER — Ambulatory Visit
Admit: 2021-10-13 | Discharge: 2021-10-13 | Payer: PRIVATE HEALTH INSURANCE | Attending: Dermatology | Primary: Student in an Organized Health Care Education/Training Program

## 2021-10-13 DIAGNOSIS — L821 Other seborrheic keratosis: Secondary | ICD-10-CM

## 2021-10-13 MED ORDER — TRIAMCINOLONE ACETONIDE 0.1 % EX CREA
0.1 % | CUTANEOUS | 2 refills | Status: AC
Start: 2021-10-13 — End: ?

## 2021-10-13 NOTE — Progress Notes (Signed)
[facility]  Antonietta Breach, MD  [place]  1956-09-21    65 y.o. female     Date of Visit: 10/13/2021    Chief Complaint: skin lesions    History of Present Illness:    1.  She complains of an asymptomatic lesion on the left upper arm.     2.  She reports multiple asymptomatic growths on the lower neck and chest.     3.  She has multiple nevi - not aware of any changes in size, color or shape.       4.  She reports a mole on the neck that is frequently irritated.      5.  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 Caryn Section on 12/29/2017.     6.  She also complains of a pruritic eruption in the left ear.     Review of Systems:  Gen: Feels well, good sense of health.      Past Medical History, Family History, Surgical History, Medications and Allergies reviewed.    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/2017    Depression     GERD (gastroesophageal reflux disease)     NO LONGER SINCE LAP    GERD (gastroesophageal reflux disease) 01/23/2009    Hx of blood clots     Hyperlipidemia     hx; resolved with lap band    Hypertension     Idiopathic peripheral neuropathy 03/26/2017    Melanoma ([place])     upper back    MRSA (methicillin resistant staph aureus) culture positive 10/17/2019    leg abscess    Obesity     hx of; had lap band    Obstructive sleep apnea 09/18/2015    uses bi-pap    Obstructive sleep apnea (adult) (pediatric) 09/18/2015    Pulmonary embolism ([place])     Stenosis of right carotid artery 06/16/2021     Past Surgical History:   Procedure Laterality Date    BACK SURGERY      neck  plates    BREAST SURGERY      left lumpectomy     CESAREAN SECTION      CHOLECYSTECTOMY      COLONOSCOPY      COLONOSCOPY  04/08/10    COLONOSCOPY N/A 04/18/2021    COLONOSCOPY DIAGNOSTIC performed by Norwood Levo, MD at [place] CATH LAB PROCEDURE      DILATATION, ESOPHAGUS      ENDOSCOPY, COLON, DIAGNOSTIC      HYSTERECTOMY (CERVIX STATUS UNKNOWN)      HYSTERECTOMY, VAGINAL      LAP BAND  05/01/08    Dr. Nicole Cella    OTHER SURGICAL HISTORY      [place] Filter: curently in place    OTHER SURGICAL HISTORY  07/05/13    lap band removal    SHOULDER SURGERY      SKIN CANCER EXCISION  12/29/2017  UPPER GASTROINTESTINAL ENDOSCOPY  05/19/13    UPPER GASTROINTESTINAL ENDOSCOPY  07/21/13    ESOPHAGEAL STENT PLACEMENT    UPPER GASTROINTESTINAL ENDOSCOPY N/A 07/31/2014    Esophagogastroduodenoscopy with esophageal balloon dilation    UPPER GASTROINTESTINAL ENDOSCOPY N/A 04/18/2021    EGD DILATION BALLOON performed by Norwood Levo, MD at [place]  04/2015    Lucendia Herrlich, celiac artery angiogram, normal abd arteries       Allergies   Allergen Reactions    Ibuprofen     Seasonal     Talwin [Pentazocine] Other (See Comments)     dizzy     Outpatient Medications Marked as Taking for the 10/13/21 encounter (Office Visit) with Francisca December, MD   Medication Sig Dispense Refill    topiramate (TOPAMAX) 25 MG tablet Take 50 mg by mouth daily      Dulaglutide (TRULICITY) 5.46 EV/0.3JK SOPN Inject 0.75 mg into the skin once a week      montelukast (SINGULAIR) 10 MG tablet TAKE ONE TABLET BY MOUTH DAILY. **MUST CALL MD FOR APPOINTMENT 30 tablet 5    fluocinonide (LIDEX) 0.05 % external solution APPLY SPARINGLY TO THE BACK OF THE SCALP DAILY AS NEEDED FOR ITCHING 60 mL 1    lisinopril (PRINIVIL;ZESTRIL) 40 MG tablet TAKE ONE TABLET BY MOUTH DAILY 90 tablet 1    omeprazole (PRILOSEC) 40 MG delayed release capsule TAKE ONE CAPSULE BY MOUTH DAILY 26 capsule 3    oxyCODONE-acetaminophen (PERCOCET) 7.5-325 MG  per tablet       rosuvastatin (CRESTOR) 20 MG tablet daily      topiramate (TOPAMAX) 25 MG tablet at bedtime      magnesium (MAGNESIUM-OXIDE) 250 MG TABS tablet Take 350 mg by mouth      Coenzyme Q10 (CO Q10) 200 MG CAPS Take by mouth      nitroGLYCERIN (NITROSTAT) 0.4 MG SL tablet up to max of 3 total doses. If no relief after 1 dose, call 911. 25 tablet 3    ranolazine (RANEXA) 500 MG extended release tablet Take 1 tablet by mouth 2 times daily (Patient taking differently: Take 1,000 mg by mouth 2 times daily) 180 tablet 3    cetirizine (ZYRTEC) 10 MG tablet Take 10 mg by mouth daily      linaclotide (LINZESS) 145 MCG capsule Take 1 capsule by mouth every morning (before breakfast) (Patient taking differently: Take 145 mcg by mouth as needed) 30 capsule 5    calcium citrate-vitamin D (CITRICAL + D) 315-250 MG-UNIT TABS per tablet Take 2 tablets by mouth 2 times daily      lactulose (CHRONULAC) 10 GM/15ML solution lactulose 10 gram/15 mL oral solution   TAKE 15MLS BY MOUTH EVERY DAY AS NEEDED      clotrimazole-betamethasone (LOTRISONE) 1-0.05 % cream Apply topically 2 times daily. 45 g 0    desloratadine (CLARINEX) 5 MG tablet Take 1 tablet by mouth daily 90 tablet 1    triamcinolone (KENALOG) 0.1 % cream APPLY TO AFFECTED AREA(S) TWO TIMES A DAY FOR UP TO 2 WEEKS OR UNTIL IMPROVED (Patient taking differently: as needed) 80 g 0    diclofenac sodium (VOLTAREN) 1 % GEL APPLY 4 GRAMS TO AFFECTED AREA(S) TWO TIMES A DAY 100 g 1    ondansetron (ZOFRAN) 4 MG tablet TAKE ONE TABLET BY MOUTH THREE TIMES A DAY AS NEEDED FOR NAUSEA AND VOMITING 30 tablet 0    meclizine (ANTIVERT) 25 MG tablet TAKE ONE TABLET  BY MOUTH THREE TIMES A DAY AS NEEDED FOR DIZZINESS 15 tablet 0    Cholecalciferol (VITAMIN D3) 50 MCG (2000 UT) CAPS Take 1 capsule by mouth daily 90 capsule 3    spironolactone (ALDACTONE) 25 MG tablet TAKE ONE AND ONE-HALF TABLET BY MOUTH DAILY (Patient taking differently: Take 25 mg by mouth TAKE ONE TABLET BY MOUTH  DAILY) 38 tablet 0    calcium-vitamin D (CALCIUM 500/D) 500-200 MG-UNIT per tablet Take 1 tablet by mouth daily      acetic acid-hydrocortisone (VOSOL-HC) 1-2 % otic solution 4 drops in affected ear or ears every 3 hours while awake (Patient taking differently: as needed e) 15 mL 1    albuterol sulfate HFA (PROVENTIL HFA) 108 (90 Base) MCG/ACT inhaler Inhale 2 puffs into the lungs every 6 hours as needed for Wheezing 3 Inhaler 3    lidocaine (LIDODERM) 5 % Place 1 patch onto the skin daily 12 hours on, 12 hours off. 30 patch 0    torsemide (DEMADEX) 20 MG tablet Take 1 tablet by mouth daily as needed (swelling) 30 tablet 1    aspirin 81 MG EC tablet Take 1 tablet by mouth daily 30 tablet 11    Multiple Vitamins-Minerals (MULTIVITAMIN PO) Take 1 tablet by mouth daily              Physical Examination       The following were examined and determined to be normal: Psych/Neuro, Scalp/hair, Head/face, Conjunctivae/eyelids, Gums/teeth/lips, Breast/axilla/chest, Abdomen, Back, RUE, LUE, RLE, LLE, and Nails/digits.    The following were examined and determined to be abnormal: Neck.     Well appearing.    1.  Left forearm with a small stuck on appearing verrucous tan papule.    2.  Lower anterior neck, presternal chest and inframammary region with several pedunculated small brown papules.    3.  Trunk and extremities with scattered well-defined round of uniformly brown macules.    4.  Left submental region with a 5 mm round soft smooth erythematous papule.         5.  Central upper back with a well-healed linear surgical scar.    6.  Concha of the left ear with mild erythema and scaling.     Assessment and Plan     1. SK (seborrheic keratosis)     Reassurance.      2. Cutaneous skin tags     Reassurance.      3. Multiple nevi - benign appearing    Sun protective behaviors, including use of at least SPF 30 sunscreen, and self skin examinations were encouraged.  Call for any new or concerning lesions.       4. Irritated nevus  of neck - 5 mm    The patient requested that the lesion be removed.  The procedure was discussed in detail. We also reviewed the risks of bleeding, scar, and infection. A consent form was signed by the patient.     The lesion to be removed was marked with a surgical pen.  Alcohol was used to cleanse the site. Local anesthesia was acheived with 1% lidocaine with epinephrine. Shave removal of the lesion was performed down to mid dermis using a razor blade.  Hemostasis was achieved with aluminum chloride. The wound was dressed with petrolatum and covered with a bandage.  Wound care instructions were reviewed.  1 Specimen (s) sent to pathology.  The specimen bottle(s) were appropriately labeled.      The patient  tolerated the procedure well and there were no immediate complications.      5. History of T1a melanoma of the back - no signs of recurrence.    Sun protective behaviors, including use of at least SPF 30 sunscreen, and self skin examinations were encouraged.  Call for any new or concerning lesions.       6.   Dermatitis of the left ear -     Triamcinolone 0.1% cream twice daily for up to 2 weeks or until improved.           Return in about 6 months (around 04/12/2022).    --Francisca December, MD

## 2021-10-13 NOTE — Telephone Encounter (Signed)
Informed patient that rx was sent in.   She verbalized understanding.

## 2021-10-13 NOTE — Telephone Encounter (Signed)
[phone].    Patient was just here to visit Dr.Meier and he mentioned sending in a Rx cream for her ear. She wanted to make sure this got advised and sent in.    Thank you!!

## 2021-10-13 NOTE — Patient Instructions (Signed)
Biopsy Wound Care Instructions    Keep the bandage in place for 24 hours.   Cleanse the wound with mild soapy water daily  Gently dry the area.  Apply Vaseline or petroleum jelly to the wound using a cotton tipped applicator.  Cover with a clean bandage.  Repeat this process until the biopsy site is healed.  If you had stitches placed, continue treating the site until the stitches are removed. Remember to make an appointment to return to have your stitches removed by our staff.  You may shower and bathe as usual.       ** Biopsy results generally take around 7 business days to come back.  If you have not heard from us by then, please call the office at [phone].    *Please note that biopsy results are released to both the patient and physician at the same time in MyChart.  Please allow time for your physician to review the results.  One of our staff members will reach out to you with the results and plan.

## 2021-10-14 ENCOUNTER — Inpatient Hospital Stay: Payer: PRIVATE HEALTH INSURANCE

## 2021-10-14 LAB — BASIC METABOLIC PANEL
Anion Gap: 14 (ref 3–16)
BUN: 25 mg/dL — ABNORMAL HIGH (ref 7–20)
CO2: 21 mmol/L (ref 21–32)
Calcium: 10.1 mg/dL (ref 8.3–10.6)
Chloride: 107 mmol/L (ref 99–110)
Creatinine: 1.3 mg/dL — ABNORMAL HIGH (ref 0.6–1.2)
Est, Glom Filt Rate: 46 — AB (ref >60)
Glucose: 123 mg/dL — ABNORMAL HIGH (ref 70–99)
Potassium: 4.4 mmol/L (ref 3.5–5.1)
Sodium: 142 mmol/L (ref 136–145)

## 2021-10-14 LAB — CBC
Hematocrit: 37.1 % (ref 36.0–48.0)
Hemoglobin: 12.3 g/dL (ref 12.0–16.0)
MCH: 29.5 pg (ref 26.0–34.0)
MCHC: 33.1 g/dL (ref 31.0–36.0)
MCV: 89.2 fL (ref 80.0–100.0)
MPV: 8.4 fL (ref 5.0–10.5)
Platelets: 218 10*3/uL (ref 135–450)
RBC: 4.16 M/uL (ref 4.00–5.20)
RDW: 14 % (ref 12.4–15.4)
WBC: 8.3 10*3/uL (ref 4.0–11.0)

## 2021-10-14 LAB — EKG 12-LEAD
Atrial Rate: 78 {beats}/min
P Axis: 35 degrees
P-R Interval: 150 ms
Q-T Interval: 396 ms
QRS Duration: 74 ms
QTc Calculation (Bazett): 451 ms
R Axis: -3 degrees
T Axis: 42 degrees
Ventricular Rate: 78 {beats}/min

## 2021-10-14 LAB — EJECTION FRACTION PERCENTAGE: Left Ventricular Ejection Fraction: 70 %

## 2021-10-14 MED ORDER — HEPARIN (PORCINE) IN NACL 1000-0.9 UT/500ML-% IV SOLN
1000-0.9500- UT/500ML-% | INTRAVENOUS | Status: AC
Start: 2021-10-14 — End: ?

## 2021-10-14 MED ORDER — LIDOCAINE HCL (PF) 1 % IJ SOLN
1 % | INTRAMUSCULAR | Status: AC
Start: 2021-10-14 — End: ?

## 2021-10-14 MED ORDER — FENTANYL CITRATE (PF) 100 MCG/2ML IJ SOLN
100 MCG/2ML | INTRAMUSCULAR | Status: AC
Start: 2021-10-14 — End: ?

## 2021-10-14 MED ORDER — MIDAZOLAM HCL 2 MG/2ML IJ SOLN
2 MG/ML | INTRAMUSCULAR | Status: AC
Start: 2021-10-14 — End: ?

## 2021-10-14 MED ORDER — HEPARIN SODIUM (PORCINE) 1000 UNIT/ML IJ SOLN
1000 UNIT/ML | INTRAMUSCULAR | Status: AC
Start: 2021-10-14 — End: ?

## 2021-10-14 MED ORDER — LISINOPRIL 20 MG PO TABS
20 MG | ORAL_TABLET | ORAL | 2 refills | Status: AC
Start: 2021-10-14 — End: 2022-01-26

## 2021-10-14 MED ORDER — IOPAMIDOL 76 % IV SOLN
76 % | Freq: Once | INTRAVENOUS | Status: AC
Start: 2021-10-14 — End: 2021-10-14
  Administered 2021-10-14: 16:00:00 66 mL

## 2021-10-14 MED ORDER — NITROGLYCERIN IN D5W 200-5 MCG/ML-% IV SOLN
200-5- MCG/ML-% | INTRAVENOUS | Status: AC
Start: 2021-10-14 — End: ?

## 2021-10-14 MED ORDER — DILTIAZEM HCL ER COATED BEADS 180 MG PO CP24
180 MG | ORAL_CAPSULE | Freq: Every day | ORAL | 3 refills | Status: AC
Start: 2021-10-14 — End: 2022-01-26

## 2021-10-14 MED ORDER — NORMAL SALINE FLUSH 0.9 % IV SOLN
0.9 % | INTRAVENOUS | Status: DC | PRN
Start: 2021-10-14 — End: 2021-10-14

## 2021-10-14 MED FILL — HEPARIN (PORCINE) IN NACL 1000-0.9 UT/500ML-% IV SOLN: INTRAVENOUS | Qty: 1000

## 2021-10-14 MED FILL — MIDAZOLAM HCL 2 MG/2ML IJ SOLN: 2 MG/ML | INTRAMUSCULAR | Qty: 2

## 2021-10-14 MED FILL — FENTANYL CITRATE (PF) 100 MCG/2ML IJ SOLN: 100 MCG/2ML | INTRAMUSCULAR | Qty: 2

## 2021-10-14 MED FILL — NITROGLYCERIN IN D5W 200-5 MCG/ML-% IV SOLN: 200-5 MCG/ML-% | INTRAVENOUS | Qty: 250

## 2021-10-14 MED FILL — HEPARIN SODIUM (PORCINE) 1000 UNIT/ML IJ SOLN: 1000 UNIT/ML | INTRAMUSCULAR | Qty: 10

## 2021-10-14 MED FILL — XYLOCAINE-MPF 1 % IJ SOLN: 1 % | INTRAMUSCULAR | Qty: 30

## 2021-10-14 NOTE — Op Note (Signed)
Cardiac Cath 10/14/2021:  Access: Right RA  Ultrasound: Ultrasound guidance used to determine after mentioned artery patency, size (> 2 mm), anatomic variations and ideal puncture location.  Real-time ultrasound utilized concurrent with vascular needle entry into the artery.  Images permanently recorded and reported in the patient chart.  Hemostasis: TR band    Moderate Sedation:  Start time: 0947  Stop time: 1019  6 mg versed   200 mcg fentanyl   An independent trained observer pushed medications at my direction. We monitored the patient's level of consciousness and vital signs/physiologic status throughout the procedure duration (see start and start times above).      Bleeding risk: Low  LVEDP: 9 mmHg  AO: 88/53 mmHg  Estimated blood loss: Less than 20 mL  Contrast: 66 mL  Fluoroscopy time: 1.9 min.    Anatomy:   LM-normal  LAD-patent proximal stent with 10% in-stent restenosis, 30% mid with mild myocardial bridging  Cx-10% to 20% proximal  OM1-normal  RCA-normal  LVEF-70%    Impression:  1.  Mild nonobstructive CAD.  2.  Hyperdynamic LV systolic function.  3.  High resting heart rate.    Plan:  1.  Recommend addition of diltiazem CD 180 mg daily.  To accommodate addition of diltiazem reduce Ranexa to 500 mg twice daily.  Also would decrease lisinopril to 20 mg daily to allow for enough blood pressure to accommodate addition of diltiazem.  Diltiazem may be helpful should there be a component of microvascular angina.

## 2021-10-14 NOTE — H&P (Signed)
[place]    10/14/2021    Rachael Mays (DOB:  18-Feb-1956) is a 65 y.o. female is here for follow up and management of CAD and modifiable risk factors.    Referring Provider: Kern Reap, MD    HISTORY:  Ms. Holleigh Crihfield has a history of coronary artery disease s/p DES of LAD (03/2016), hypertension, hyperlipidemia, diastolic heart failure, and carotid artery stenosis.  She has chronic chest pain and shortness of breath that did not resolve after coronary intervention in 03/2016.  RHC (2017) showed PA mean 33 mmHg, PCWP 18 mmHg (transpulmonary gradient 14), PVR 2.134 Wood units.     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      She most recently followed with cardiologist Dr. Latricia Heft through [facility].  Due to intermittent chest heaviness and pressure which was worse even with low levels of exertion and usually relieved with rest, she underwent nuclear stress test that showed ischemia subtended by LAD. 02/18/21 LHC showed patent proximal LAD stent with mid 30% stenosis in some views and FFR 0.89.  Medical management was recommended.      On 04/18/21, she had diagnostic colonoscopy and EGD  Balloon dilatation of the distal esophagus by Dr. Earlie Server.      Today,  she has had recurrent chest pain.    REVIEW OF SYSTEMS:  A complete review of systems was reviewed and is negative except as noted in the history of present illness.    Prior to Visit Medications    Medication Sig Taking? Authorizing Provider   triamcinolone (KENALOG) 0.1 % cream Apply to affected area twice daily for up to 2 weeks or until improved.  Francisca December, MD   topiramate (TOPAMAX) 25 MG tablet Take 50 mg by mouth daily  Historical Provider, MD   Dulaglutide (TRULICITY) 0.10 UV/2.5DG SOPN Inject 1.5 mg into the skin once a week  Historical Provider, MD   montelukast (SINGULAIR) 10 MG tablet TAKE ONE TABLET BY MOUTH DAILY. **MUST CALL MD FOR APPOINTMENT  Marguarite Arbour Leigh  Wojciechowski, APRN - NP   fluocinonide (LIDEX) 0.05 % external solution APPLY SPARINGLY TO THE BACK OF THE SCALP DAILY AS NEEDED FOR ITCHING  Francisca December, MD   lisinopril (PRINIVIL;ZESTRIL) 40 MG tablet TAKE ONE TABLET BY MOUTH DAILY  [name] Leigh Wojciechowski, APRN - NP   omeprazole (PRILOSEC) 40 MG delayed release capsule TAKE ONE CAPSULE BY MOUTH DAILY  [name] Leigh Wojciechowski, APRN - NP   oxyCODONE-acetaminophen (PERCOCET) 7.5-325 MG per tablet   Historical Provider, MD   rosuvastatin (CRESTOR) 20 MG tablet daily  Historical Provider, MD   topiramate (TOPAMAX) 25 MG tablet at bedtime  Historical Provider, MD   magnesium (MAGNESIUM-OXIDE) 250 MG TABS tablet Take 350 mg by mouth  Historical Provider, MD   Coenzyme Q10 (CO Q10) 200 MG CAPS Take by mouth  Historical Provider, MD   nitroGLYCERIN (NITROSTAT) 0.4 MG SL tablet up to max of 3 total doses. If no relief after 1 dose, call 911.  Leodis Binet, MD   ranolazine (RANEXA) 500 MG extended release tablet Take 1 tablet by mouth 2 times daily  Patient taking differently: Take 1,000 mg by mouth 2 times daily  Leodis Binet, MD   cetirizine (ZYRTEC) 10 MG tablet Take 10 mg by mouth daily  Historical Provider, MD   linaclotide (LINZESS) 145 MCG capsule Take 1 capsule by mouth every morning (before breakfast)  Patient not taking: Reported on 10/14/2021  Norwood Levo, MD   calcium citrate-vitamin D (CITRICAL + D) 315-250 MG-UNIT TABS per tablet Take 2 tablets by mouth 2 times daily  Historical Provider, MD   lactulose (CHRONULAC) 10 GM/15ML solution lactulose 10 gram/15 mL oral solution   TAKE 15MLS BY MOUTH EVERY DAY AS NEEDED  Historical Provider, MD   clotrimazole-betamethasone (LOTRISONE) 1-0.05 % cream Apply topically 2 times daily.  Marguarite Arbour Leigh Wojciechowski, APRN - NP   desloratadine (CLARINEX) 5 MG tablet Take 1 tablet by mouth daily  Patient not taking: Reported on 10/14/2021  [name] Leigh Wojciechowski, APRN - NP   triamcinolone (KENALOG) 0.1 %  cream APPLY TO AFFECTED AREA(S) TWO TIMES A DAY FOR UP TO 2 WEEKS OR UNTIL IMPROVED  Patient taking differently: as needed  Francisca December, MD   diclofenac sodium (VOLTAREN) 1 % GEL APPLY 4 GRAMS TO AFFECTED AREA(S) TWO TIMES A DAY  Jamal Collin, MD   ondansetron (ZOFRAN) 4 MG tablet TAKE ONE TABLET BY MOUTH THREE TIMES A DAY AS NEEDED FOR NAUSEA AND VOMITING  [name] Leigh Wojciechowski, APRN - NP   meclizine (ANTIVERT) 25 MG tablet TAKE ONE TABLET BY MOUTH THREE TIMES A DAY AS NEEDED FOR DIZZINESS  [name] Leigh Wojciechowski, APRN - NP   Cholecalciferol (VITAMIN D3) 50 MCG (2000 UT) CAPS Take 1 capsule by mouth daily  [name] Leigh Wojciechowski, APRN - NP   spironolactone (ALDACTONE) 25 MG tablet TAKE ONE AND ONE-HALF TABLET BY MOUTH DAILY  Patient taking differently: Take 25 mg by mouth TAKE ONE TABLET BY MOUTH DAILY  [name] Leigh Wojciechowski, APRN - NP   calcium-vitamin D (CALCIUM 500/D) 500-200 MG-UNIT per tablet Take 1 tablet by mouth daily  Patient not taking: Reported on 10/14/2021  Historical Provider, MD   acetic acid-hydrocortisone (VOSOL-HC) 1-2 % otic solution 4 drops in affected ear or ears every 3 hours while awake  Patient taking differently: as needed e  Alvina Chou, MD   albuterol sulfate HFA (PROVENTIL HFA) 108 (90 Base) MCG/ACT inhaler Inhale 2 puffs into the lungs every 6 hours as needed for Wheezing  Almira Coaster, APRN - CNP   lidocaine (LIDODERM) 5 % Place 1 patch onto the skin daily 12 hours on, 12 hours off.  Sharmaine Base Wanninger, PA-C   torsemide (DEMADEX) 20 MG tablet Take 1 tablet by mouth daily as needed (swelling)  Luiz Iron, APRN - CNP   aspirin 81 MG EC tablet Take 1 tablet by mouth daily  Harvel Quale, MD   Multiple Vitamins-Minerals (MULTIVITAMIN PO) Take 1 tablet by mouth daily   Historical Provider, MD        Allergies   Allergen Reactions    Ibuprofen     Seasonal     Talwin [Pentazocine] Other (See Comments)     dizzy       Past Medical History:    Diagnosis Date    Anxiety     Asthma     Blood circulation, collateral     CAD (coronary artery disease)     CHF (congestive heart failure) (HCC)     Chronic back pain     Deep vein thrombosis     Degeneration of thoracic intervertebral disc 10/15/2017    Depression     GERD (gastroesophageal reflux disease)     NO LONGER SINCE LAP    GERD (gastroesophageal reflux disease) 01/23/2009    Hx of blood clots  Hyperlipidemia     hx; resolved with lap band    Hypertension     Idiopathic peripheral neuropathy 03/26/2017    Melanoma ([place])     upper back    MRSA (methicillin resistant staph aureus) culture positive 10/17/2019    leg abscess    Obesity     hx of; had lap band    Obstructive sleep apnea 09/18/2015    uses bi-pap    Obstructive sleep apnea (adult) (pediatric) 09/18/2015    Pulmonary embolism ([place])     Stenosis of right carotid artery 06/16/2021       Past Surgical History:   Procedure Laterality Date    BACK SURGERY      neck  plates    BREAST SURGERY      left lumpectomy    CESAREAN SECTION      CHOLECYSTECTOMY      COLONOSCOPY      COLONOSCOPY  04/08/10    COLONOSCOPY N/A 04/18/2021    COLONOSCOPY DIAGNOSTIC performed by Norwood Levo, MD at [facility] CATH LAB PROCEDURE      DILATATION, ESOPHAGUS      ENDOSCOPY, COLON, DIAGNOSTIC      HYSTERECTOMY (CERVIX STATUS UNKNOWN)      HYSTERECTOMY, VAGINAL      LAP BAND  05/01/08    Dr. Nicole Cella    OTHER SURGICAL HISTORY      Greenfield Filter: curently in place    OTHER SURGICAL HISTORY  07/05/13    lap band removal    SHOULDER SURGERY      SKIN CANCER EXCISION  12/29/2017    UPPER GASTROINTESTINAL ENDOSCOPY  05/19/13    UPPER GASTROINTESTINAL ENDOSCOPY  07/21/13    ESOPHAGEAL STENT PLACEMENT    UPPER GASTROINTESTINAL ENDOSCOPY N/A 07/31/2014    Esophagogastroduodenoscopy with esophageal balloon dilation    UPPER GASTROINTESTINAL ENDOSCOPY N/A 04/18/2021    EGD DILATION BALLOON performed by Norwood Levo, MD  at [facility]  04/2015    Lucendia Herrlich, celiac artery angiogram, normal abd arteries       Social History     Tobacco Use    Smoking status: Former     Packs/day: 0.50     Years: 40.00     Pack years: 20.00     Types: Cigarettes     Quit date: 08/07/2012     Years since quitting: 9.1    Smokeless tobacco: Former     Quit date: 06/16/2013    Tobacco comments:     started to smoke at age 8 / only smoked 0.5 p.p.d    Substance Use Topics    Alcohol use: No     Alcohol/week: 0.0 standard drinks        Family History   Problem Relation Age of Onset    Arthritis Mother     Cancer Mother     Depression Mother     High Blood Pressure Mother     Heart Disease Mother 76        MI     Ovarian Cancer Mother 65    Cancer Father 72        colon    Heart Disease Maternal Grandmother     Early Death Paternal Grandmother     Heart Disease Paternal Grandfather     Diabetes Other     High Blood Pressure Other  Obesity Other     Other Sister         OSA    Other Brother         OSA    Other Brother         OSA    Other Daughter         OSA       PHYSICAL EXAMINATION:  Vitals:    10/14/21 0830   BP: 132/63   Pulse: 78   Resp: 16   Temp: 98.5 ??F (36.9 ??C)   Weight: (!) 311 lb (141.1 kg)   Height: 5\' 5"  (1.651 m)     Estimated body mass index is 51.75 kg/m?? as calculated from the following:    Height as of this encounter: 5\' 5"  (1.651 m).    Weight as of this encounter: 311 lb (141.1 kg).    Physical Exam  Constitutional:       Appearance: She is well-developed. She is not diaphoretic.   HENT:      Head: Normocephalic and atraumatic.   Eyes:      General: No scleral icterus.     Extraocular Movements: Extraocular movements intact.      Conjunctiva/sclera: Conjunctivae normal.   Neck:      Vascular: No JVD.   Cardiovascular:      Rate and Rhythm: Normal rate and regular rhythm.      Heart sounds: Normal heart sounds. No murmur heard.    No friction rub. No gallop.   Pulmonary:      Effort: Pulmonary effort is normal.  No respiratory distress.      Breath sounds: Normal breath sounds. No wheezing or rales.   Abdominal:      General: Bowel sounds are normal.      Palpations: Abdomen is soft.      Tenderness: There is no abdominal tenderness.   Musculoskeletal:         General: Normal range of motion.      Cervical back: Normal range of motion and neck supple.   Skin:     General: Skin is warm and dry.      Findings: No rash.   Neurological:      General: No focal deficit present.      Mental Status: She is alert and oriented to person, place, and time.         I have reviewed all pertinent lab results and diagnostic testing.        LABS:  CBC:   Lab Results   Component Value Date/Time    WBC 8.3 10/14/2021 08:40 AM    RBC 4.16 10/14/2021 08:40 AM    HGB 12.3 10/14/2021 08:40 AM    HCT 37.1 10/14/2021 08:40 AM    MCV 89.2 10/14/2021 08:40 AM    RDW 14.0 10/14/2021 08:40 AM    PLT 218 10/14/2021 08:40 AM     CMP:   Lab Results   Component Value Date/Time    NA 142 10/14/2021 08:40 AM    K 4.4 10/14/2021 08:40 AM    K 4.6 10/17/2019 06:07 AM    CL 107 10/14/2021 08:40 AM    CO2 21 10/14/2021 08:40 AM    BUN 25 10/14/2021 08:40 AM    CREATININE 1.3 10/14/2021 08:40 AM    GFRAA >60 10/18/2019 05:42 AM    GFRAA >60 03/16/2012 03:42 PM    AGRATIO 0.9 10/16/2019 04:55 PM    LABGLOM 46 10/14/2021 08:40 AM  GLUCOSE 123 10/14/2021 08:40 AM    PROT 7.5 02/13/2021 03:02 PM    PROT 7.5 02/13/2021 03:02 PM    PROT 7.3 03/31/2011 03:07 PM    CALCIUM 10.1 10/14/2021 08:40 AM    BILITOT 0.7 02/13/2021 03:02 PM    BILITOT 0.7 02/13/2021 03:02 PM    ALKPHOS 71 02/13/2021 03:02 PM    ALKPHOS 71 02/13/2021 03:02 PM    AST 53 02/13/2021 03:02 PM    AST 53 02/13/2021 03:02 PM    ALT 47 02/13/2021 03:02 PM    ALT 47 02/13/2021 03:02 PM     Lab Results   Component Value Date/Time    CHOL 149 02/13/2021 03:02 PM    TRIG 116 02/13/2021 03:02 PM    HDL 48 02/13/2021 03:02 PM    HDL 52 03/31/2011 03:07 PM    LDLCALC 78 02/13/2021 03:02 PM       PT/INR: No  results found for: PTINR     Cardiac cath 02/18/21 ([facility])   Angiographic Findings:  Right dominant system   Left Main:  Normal     Left Anterior Descending:  Patent proximal stent. Mid 30% stenosis,   irregular appearing in some views. FFR 0.89.     Circumflex:  No obstructive disease     Right Coronary:  No obstructive disease     Left Ventriculogram:  LVEF 55-60%     Hemodynamics (mm Hg):   Left Ventricular Pressure:  102/49   Central Aortic Pressure:  125/0, 6     Conclusions:   -No obstructive CAD     Recommendations:   -Follow-up with GI   -Continue medical therapy         Lexiscan myoview 01/29/21 ([facility])  Interpetation Summary:   1. No no change of baseline chest pain after regadenoson injection.   2. No evidence of stress-induced ischemia on EKG.   3. There is evidence of ischemia in the territory subtended by the left anterior descending coronary artery.   4. Normal LV systolic function.        Carotid duplex 11/12/20: [facility])      o 50-69% stenosis of the right internal carotid artery based on velocity        criteria.      o No hemodynamically significant lesions are identified on the left        internal or common carotid(s).      o There is antegrade flow demonstrated in the right vertebral artery.      o The left vertebral artery could not be visualized.     Echocardiogram 05/27/20: ([facility])     The left ventricle is normal in size.   There is normal left ventricular wall thickness.   Left ventricular systolic function is normal. (LVEF>/=55%)   Overall left ventricular ejection fraction is estimated to be 60-65%.   Normal Diastolic Function.   No significant valvular abnormalities identified.         Procedure:          LHC 08/09/19  Indication:          Unstable angina     Procedure Details:  Consent Access Bleed R Sedat Start Stop Versed Fentanyl Contrast Fluoro EBL Comp Spec   Yes RRA low MCSFC 1457 1522 4 150 51 1.9 <20 None None   *Ultrasound Note: Ultrasound guidance used to  determine aforementioned artery patency, size (>75mm), anatomic variations and ideal puncture location.  Real-time ultrasound utilized concurrent with vascular needle  entry into the artery.  Image(s) permanently recorded and reported in the patient chart.  *Sedation Note: MCSFC = minimal conscious sedation for comfort     Findings:  Artery Findings/Result   LM Normal   LAD Widely patent stent, 50% ostial D1 jailed by stent (no change)   Cx Normal   RI NA   RCA Normal   LVEDP 6   LVG 60%      Intervention:         None     Post Cath Dx:       Nonobstructive CAD, patent stent LAD    Cardiac Cath 05/07/17:  Anatomy:   LM-normal  LAD-normal, widely patent stent mid  Cx-normal  OM1- normal  RCA-normal, right dominant  RPDA- normal  LVEF- 60%     Impression:  1.  Normal coronary artery anatomy.  2.  Widely patent mid LAD stent.  3.  Normal LV systolic function.  4.  False positive stress test.  Likely breast artifact.     Plan:  1.  Medical management.  2.  She needs workup of noncardiac causes of chest pain.  She now has to stress test with similar abnormalities most consistent with breast artifact.  The majority of her coronary angiograms have demonstrated normal anatomy except for the one that had a 70% mid LAD stenosis which was stented.  Her chest pain has atypical features.  Recommend rheumatology and possibly orthopedic evaluation.    Echo 08/05/16 ([place], [place])  LV with normal systolic function,  EF 16-10%  Mild MR with multiple jets  Very mild AS  Mild TR      Cardiac Cath 07/17/16:  Anatomy:   LM-normal  LAD-widely patent stent otherwise normal  Cx-normal, dominant  OM1- normal  RCA-normal  LVEF- 70%, LVEDP 15 mmHg     Impression:  1.  Widely patent LAD stent otherwise normal coronary arteries  2.  Hyperdynamic LV systolic function  3.  Non-cardiac chest pain     Plan:  1.  Medical management    Echo 04/06/16:   Summary   Normal left ventricle size and systolic function with an estimated ejection   fraction of  65%.   There is mild concentric left ventricular hypertrophy.   Mild mitral regurgitation is present.   The aortic valve appears sclerotic but opens well.   There is mild tricuspid regurgitation with RVSP estimated at 42 mmHg.   Mild pulmonic regurgitation present.   Definity was used to better delineate endocardium.    Cardiac Cath 03/31/16:  Anatomy:   LM-normal   LAD-70% mid with FFR 0.77  Cx-normal, codominant  OM1- normal  RCA-normal  RPDA- normal  LVEF- 70%, LVEDP 19   PCI: LAD 70% to 0% with 3.25 mm x 18 mm Xience Alpine drug-eluting stent     Hemodynamics:  RA-15/12 mean 9  RV- 60/0, 9  PAWP-20/26 mean 19  PA- 60/12 mean 33        Impression:  1.  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  2.  Hyperdynamic LV systolic function.  3.  Mildly decompensated diastolic CHF.  4.  Moderate pulmonary hypertension.     Plan:  1.  Aspirin indefinitely.  2.  Effient/equivalent for minimum of 1 year.  3.  IV diuresis while in the hospital and then discharged on Lasix 40 mg by mouth twice a day.  4.  Continue BiPAP therapy.  5.  Outpatient cardiac rehab.  6.  CHF service follow-up on discharge.       Cardiac cath 09/23/15: normal coronary arteries      ASSESSMENT/PLAN:  1. Coronary artery disease due to lipid rich plaque  - 03/31/16 LHC - mid LAD 70% stenosis with FFR 0.77.  S/p DES x 1. Remainder of arteries normal  - 07/17/16 LHC - patent stent, otherwise normal coronary arteries  - 05/07/17 LHC - patent stent, otherwise normal coronary arteries (false positive nuclear stress test  - 08/09/19 LHC - widely patent LAD stent, 50% ostial D1 jailed by stent (no change)  - 01/29/21 SPECT - ischemia in LAD territory  - 02/18/21 LHC - right dominant system.  Patent proximal LAD stent, mid 30% stenosis, irregular appearing in some views. FFR 0.89.  Medical management.  Started on Ranexa  - meds : Ranexa, ASA, statin     Plan:  Stable.  Continue current medical regimen.    2. Chronic diastolic  congestive heart failure ([place])  3. Pulmonary hypertension  - 03/2016 Right heart cath - mildly decompensated heart failure with moderate pulmonary hypertension (PAWP-20/26 mean 19 ; PA- 60/12 mean 33)   - 04/06/16 Echo - EF 65%, mild CLVH, RVSP 42 mmHg  - 05/27/20 Echo - EF 85-46%, normal diastolic function, function is normal. (LVEF>/=55%)   Overall left ventricular ejection fraction is estimated to be 60-65%.   normal diastolic function, normal RV systolic fx  - meds: Lisinopril, spironolactone, torsemide    Plan :  Stable.  Continue current medical regimen.      4. Essential hypertension  - Blood pressure 132/63, pulse 78, temperature 98.5 ??F (36.9 ??C), resp. rate 16, height 5\' 5"  (1.651 m), weight (!) 311 lb (141.1 kg), not currently breastfeeding.    Plan : Stable.  Continue current medical regimen.    5. Dyslipidemia  - Rosuvastatin 10 mg daily.  - 02/17/21 - TC- 141, TG- 127, HDL- 47, LDL- 69.  AST- 47, ALT- 47    Plan :  Would like Crestor 20 mg daily if previously taking 10 mg daily.    6. Stenosis of right carotid artery  - 11/12/20 carotid duplex ([facility]) - RICA with 50-69% stenosis of RICA, no significant plaque in Rt common carotid artery.    No hemodynamically significant stenosis of left ICA or common carotid artery.     Plan : Carotid duplex around December 2022.    Plan :  1.  Carotid duplex.  2.  Call about Crestor dosing.  3.  RTC in 1 year.      I have reviewed the history and physical and examined the patient and find no relevant changes except for CHIRSTINA HAAN has developed increasing chest discomfort similar to previous anginal symptoms.     I have reviewed with the patient and/or family the risks, benefits, and alternatives to the procedure.    Based on these findings I recommend left heart cath for definitive evaluation of coronary arteries.  Risks, benefits, expectations, and alternative treatments were discussed.  Questions appropriately answered.  SHYLER HAMILL agrees to proceed and  verbalized understanding.       Leodis Binet, MD 10/14/2021 10:03 AM

## 2021-10-14 NOTE — Discharge Instructions (Signed)
Radial Angiogram      Care of your puncture site:  Remove clear bandage 24 hours after the procedure.  May shower in 24 hours  Inspect the site daily and gently clean using soap and water.  Dry thoroughly and apply a Band-Aid.    Normal Observations:  Soreness or tenderness which may last one week.  Mild oozing from the incision site.  Possible bruising that could last 2 weeks.    Activity:  You may resume driving 24 hours following the procedure.  You may resume normal activity in 3 days or after the wound heals.  Avoid lifting more than 10 pounds for 3 days with affected arm.    Nutrition:  Regular diet   Drink at least 8 to 10 glasses of decaffeinated, non-alcoholic fluid for the next 24 hours to flush the x-ray dye used for your angiogram out of your body.    Call your doctor immediately if your condition worsens, for any other concerns, for a follow-up appointment or if you experience any of the following:  Significant bleeding that does not stop after 10 minutes of applying firm pressure on the puncture site.  Increased swelling of the wrist.  Unusual pain, numbness, or tingling of the wrist/arm.   Any signs of infection such as: redness, yellow drainage at the site, swelling or pain.

## 2021-10-14 NOTE — Pre Sedation (Signed)
Brief Pre-Op Note/Sedation Assessment      Rachael Mays  02/14/1956  [id number]  10:04 AM    Planned Procedure: Cardiac Catheterization Procedure  Post Procedure Plan: Return to same level of care  Consent: I have discussed with the patient and/or the patient representative the indication, alternatives, and the possible risks and/or complications of the planned procedure and the anesthesia methods. The patient and/or patient representative appear to understand and agree to proceed.        Chief Complaint:   Anginal Equivalent      Indications for Cath Procedure:  Presentation:  Worsening Angina and Stable Known CAD  2.  Anginal Classification within 2 weeks:  CCS III - Symptoms with everyday living activities, i.e., moderate limitation  3.  Angina Symptoms Assessment:  Typical Chest Pain  4.  Heart Failure Class within last 2 weeks:  No symptoms  5.  Cardiovascular Instability:  No    Prior Ischemic Workup/Eval:  Pre-Procedural Medications: Yes: ACE/ARB/ARNI, Aspirin, Long Acting Nitrates (Any), and STATIN  2.   Stress Test Completed?  Yes:  Stress or Imaging Studies Performed (within ANY time period):   Type:  Stress Nuclear  Results:  Negative Extent of Ischemia:  Low Risk (<1% annual death or MI)    Does Patient need surgery?  Cath Valve Surgery:  No    Pre-Procedure Medical History:  Vital Signs:  BP 132/63    Pulse 78    Temp 98.5 ??F (36.9 ??C)    Resp 16    Ht 5\' 5"  (1.651 m)    Wt (!) 311 lb (141.1 kg)    BMI 51.75 kg/m??     Allergies:    Allergies   Allergen Reactions    Ibuprofen     Seasonal     Talwin [Pentazocine] Other (See Comments)     dizzy     Medications:    Current Facility-Administered Medications   Medication Dose Route Frequency Provider Last Rate Last Admin    sodium chloride flush 0.9 % injection 5-40 mL  5-40 mL IntraVENous PRN Leodis Binet, MD           Past Medical History:    Past Medical History:   Diagnosis Date    Anxiety     Asthma     Blood circulation, collateral     CAD  (coronary artery disease)     CHF (congestive heart failure) (HCC)     Chronic back pain     Deep vein thrombosis     Degeneration of thoracic intervertebral disc 10/15/2017    Depression     GERD (gastroesophageal reflux disease)     NO LONGER SINCE LAP    GERD (gastroesophageal reflux disease) 01/23/2009    Hx of blood clots     Hyperlipidemia     hx; resolved with lap band    Hypertension     Idiopathic peripheral neuropathy 03/26/2017    Melanoma ([place])     upper back    MRSA (methicillin resistant staph aureus) culture positive 10/17/2019    leg abscess    Obesity     hx of; had lap band    Obstructive sleep apnea 09/18/2015    uses bi-pap    Obstructive sleep apnea (adult) (pediatric) 09/18/2015    Pulmonary embolism ([place])     Stenosis of right carotid artery 06/16/2021       Surgical History:    Past Surgical History:   Procedure Laterality Date  BACK SURGERY      neck  plates    BREAST SURGERY      left lumpectomy    CESAREAN SECTION      CHOLECYSTECTOMY      COLONOSCOPY      COLONOSCOPY  04/08/10    COLONOSCOPY N/A 04/18/2021    COLONOSCOPY DIAGNOSTIC performed by Norwood Levo, MD at [facility] CATH LAB PROCEDURE      DILATATION, ESOPHAGUS      ENDOSCOPY, COLON, DIAGNOSTIC      HYSTERECTOMY (CERVIX STATUS UNKNOWN)      HYSTERECTOMY, VAGINAL      LAP BAND  05/01/08    Dr. Nicole Cella    OTHER SURGICAL HISTORY      [place] Filter: curently in place    OTHER SURGICAL HISTORY  07/05/13    lap band removal    SHOULDER SURGERY      SKIN CANCER EXCISION  12/29/2017    UPPER GASTROINTESTINAL ENDOSCOPY  05/19/13    UPPER GASTROINTESTINAL ENDOSCOPY  07/21/13    ESOPHAGEAL STENT PLACEMENT    UPPER GASTROINTESTINAL ENDOSCOPY N/A 07/31/2014    Esophagogastroduodenoscopy with esophageal balloon dilation    UPPER GASTROINTESTINAL ENDOSCOPY N/A 04/18/2021    EGD DILATION BALLOON performed by Norwood Levo, MD at [place]  04/2015    Lucendia Herrlich,  celiac artery angiogram, normal abd arteries             Pre-Sedation:  Pre-Sedation Documentation and Exam:  I have personally completed a history, physical exam & review of systems for this patient (see notes).    Prior History of Anesthesia Complications:   none    Modified Mallampati:  III (soft palate, base of uvula visible)    ASA Classification:  Class 3 - A patient with severe systemic disease that limits activity but is not incapacitating    Aldrete Scale:  Activity:  2 - Able to move 4 extremities voluntarily on command  Respiration:  2 - Able to breathe deeply and cough freely  Circulation:  2 - BP+/- 36mmHg of normal  Consciousness:  2 - Fully awake  Oxygen Saturation (color):  2 - Able to maintain oxygen saturation >92% on room air    Sedation/Anesthesia Plan:  Guard the patient's safety and welfare.  Minimize physical discomfort and pain.  Minimize negative psychological responses to treatment by providing sedation and analgesia and maximize the potential amnesia.  Patient to meet pre-procedure discharge plan.    Medication Planned:  midazolam intravenously and fentanyl intravenously    Patient is an appropriate candidate for plan of sedation:   yes      Electronically signed by Leodis Binet, MD on 10/14/2021 at 10:04 AM

## 2021-10-15 LAB — DERMATOLOGY PATHOLOGY

## 2021-10-21 ENCOUNTER — Encounter
Payer: PRIVATE HEALTH INSURANCE | Attending: Adult Health | Primary: Student in an Organized Health Care Education/Training Program

## 2021-10-24 ENCOUNTER — Ambulatory Visit
Admit: 2021-10-24 | Discharge: 2021-10-24 | Payer: PRIVATE HEALTH INSURANCE | Attending: Adult Health | Primary: Student in an Organized Health Care Education/Training Program

## 2021-10-24 DIAGNOSIS — I2510 Atherosclerotic heart disease of native coronary artery without angina pectoris: Secondary | ICD-10-CM

## 2021-10-24 NOTE — Progress Notes (Signed)
[facility]     Outpatient Follow Up Note    Rachael Mays is 65 y.o. female who presents today with a history of CAD s/p PTCA LAD April '17, d-HF, DVT/PE, s/p [place] filter, HTN and hyperlipidemia.       She was seen in office 01/11/2017 by Dr Jacqualyn Posey and imipramine was to start for chronic chest pain. 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   She had undergone a cardiac cath :   LAD-patent proximal stent with 10% in-stent restenosis, 30% mid with mild myocardial bridging  Cx-10% to 20% proximal  OM1-normal  RCA-normal  LVEF-70%  Impression:  1.  Mild nonobstructive CAD.  2.  Hyperdynamic LV systolic function.  3.  High resting heart rate..  ~diltiazem started      CHIEF COMPLAINT / HPI:  Follow Up secondary to coronary artery disease    Subjective:    She's felt good since starting diltiazem. She denies chest discomfort / SOB    The patient denies orthopnea/PND. She uses a BiPAP. She has no swelling and takes lasix prn.  The patients weight is unchanged by office scales . The patient is not experiencing palpitations or dizziness.     These symptoms have improved  since the last hospitalization    With regard to medication therapy the patient has been compliant with prescribed regimen. They have tolerated therapy to date.     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/2017    Depression     GERD (gastroesophageal reflux disease)     NO LONGER SINCE LAP    GERD (gastroesophageal reflux disease) 01/23/2009    Hx of blood clots     Hyperlipidemia     hx; resolved with lap band    Hypertension     Idiopathic peripheral neuropathy 03/26/2017    Melanoma ([place])     upper  back    MRSA (methicillin resistant staph aureus) culture positive 10/17/2019    leg abscess    Obesity     hx of; had lap band    Obstructive sleep apnea 09/18/2015    uses bi-pap    Obstructive sleep apnea (adult) (pediatric) 09/18/2015    Pulmonary embolism ([place])     Stenosis of right carotid artery 06/16/2021     Social History:    Social History     Tobacco Use   Smoking Status Former    Packs/day: 0.50    Years: 40.00    Pack years: 20.00    Types: Cigarettes    Quit date: 08/07/2012    Years since quitting: 9.2   Smokeless Tobacco Former    Quit date: 06/16/2013   Tobacco Comments    started to smoke at age 68 / only smoked 0.5 p.p.d      Current Medications:  Current Outpatient Medications   Medication Sig Dispense Refill    lisinopril (PRINIVIL;ZESTRIL) 20 MG tablet TAKE ONE TABLET BY MOUTH DAILY 90 tablet 2    dilTIAZem (CARDIZEM CD) 180 MG extended release capsule Take 1 capsule  by mouth daily 30 capsule 3    triamcinolone (KENALOG) 0.1 % cream Apply to affected area twice daily for up to 2 weeks or until improved. 15 g 2    topiramate (TOPAMAX) 25 MG tablet Take 50 mg by mouth daily      Dulaglutide (TRULICITY) 1.60 VP/7.1GG SOPN Inject 1.5 mg into the skin once a week      montelukast (SINGULAIR) 10 MG tablet TAKE ONE TABLET BY MOUTH DAILY. **MUST CALL MD FOR APPOINTMENT 30 tablet 5    fluocinonide (LIDEX) 0.05 % external solution APPLY SPARINGLY TO THE BACK OF THE SCALP DAILY AS NEEDED FOR ITCHING 60 mL 1    oxyCODONE-acetaminophen (PERCOCET) 7.5-325 MG per tablet       rosuvastatin (CRESTOR) 20 MG tablet daily      topiramate (TOPAMAX) 25 MG tablet at bedtime      magnesium (MAGNESIUM-OXIDE) 250 MG TABS tablet Take 350 mg by mouth      Coenzyme Q10 (CO Q10) 200 MG CAPS Take by mouth      ranolazine (RANEXA) 500 MG extended release tablet Take 1 tablet by mouth 2 times daily 180 tablet 3    cetirizine (ZYRTEC) 10 MG tablet Take 10 mg by mouth daily      calcium citrate-vitamin D (CITRICAL + D) 315-250  MG-UNIT TABS per tablet Take 2 tablets by mouth 2 times daily      lactulose (CHRONULAC) 10 GM/15ML solution lactulose 10 gram/15 mL oral solution   TAKE 15MLS BY MOUTH EVERY DAY AS NEEDED      clotrimazole-betamethasone (LOTRISONE) 1-0.05 % cream Apply topically 2 times daily. 45 g 0    triamcinolone (KENALOG) 0.1 % cream APPLY TO AFFECTED AREA(S) TWO TIMES A DAY FOR UP TO 2 WEEKS OR UNTIL IMPROVED (Patient taking differently: as needed) 80 g 0    diclofenac sodium (VOLTAREN) 1 % GEL APPLY 4 GRAMS TO AFFECTED AREA(S) TWO TIMES A DAY 100 g 1    Cholecalciferol (VITAMIN D3) 50 MCG (2000 UT) CAPS Take 1 capsule by mouth daily 90 capsule 3    spironolactone (ALDACTONE) 25 MG tablet TAKE ONE AND ONE-HALF TABLET BY MOUTH DAILY (Patient taking differently: Take 25 mg by mouth TAKE ONE TABLET BY MOUTH DAILY) 38 tablet 0    acetic acid-hydrocortisone (VOSOL-HC) 1-2 % otic solution 4 drops in affected ear or ears every 3 hours while awake (Patient taking differently: as needed e) 15 mL 1    albuterol sulfate HFA (PROVENTIL HFA) 108 (90 Base) MCG/ACT inhaler Inhale 2 puffs into the lungs every 6 hours as needed for Wheezing 3 Inhaler 3    aspirin 81 MG EC tablet Take 1 tablet by mouth daily 30 tablet 11    Multiple Vitamins-Minerals (MULTIVITAMIN PO) Take 1 tablet by mouth daily       omeprazole (PRILOSEC) 40 MG delayed release capsule TAKE ONE CAPSULE BY MOUTH DAILY (Patient not taking: Reported on 10/24/2021) 26 capsule 3    nitroGLYCERIN (NITROSTAT) 0.4 MG SL tablet up to max of 3 total doses. If no relief after 1 dose, call 911. (Patient not taking: Reported on 10/24/2021) 25 tablet 3    linaclotide (LINZESS) 145 MCG capsule Take 1 capsule by mouth every morning (before breakfast) (Patient not taking: Reported on 10/24/2021) 30 capsule 5    ondansetron (ZOFRAN) 4 MG tablet TAKE ONE TABLET BY MOUTH THREE TIMES A DAY AS NEEDED FOR NAUSEA AND VOMITING (Patient not taking: Reported on 10/24/2021) 30 tablet 0  meclizine  (ANTIVERT) 25 MG tablet TAKE ONE TABLET BY MOUTH THREE TIMES A DAY AS NEEDED FOR DIZZINESS (Patient not taking: Reported on 10/24/2021) 15 tablet 0    lidocaine (LIDODERM) 5 % Place 1 patch onto the skin daily 12 hours on, 12 hours off. (Patient not taking: Reported on 10/24/2021) 30 patch 0    torsemide (DEMADEX) 20 MG tablet Take 1 tablet by mouth daily as needed (swelling) (Patient not taking: Reported on 10/24/2021) 30 tablet 1     No current facility-administered medications for this visit.     REVIEW OF SYSTEMS:    CONSTITUTIONAL: No major weight gain or loss, fatigue, weakness, night sweats or fever.  HEENT: No new vision difficulties or ringing in the ears.  RESPIRATORY: No new SOB, PND, orthopnea or cough.   CARDIOVASCULAR: See HPI  GI: No nausea, vomiting, diarrhea, constipation, abdominal pain or changes in bowel habits.  GU: No urinary frequency, urgency, incontinence hematuria or dysuria.  SKIN: No cyanosis or skin lesions.  MUSCULOSKELETAL: No new muscle or joint pain.  NEUROLOGICAL: No syncope or TIA-like symptoms.  PSYCHIATRIC: No anxiety, pain, insomnia or depression    Objective:   PHYSICAL EXAM:       Vitals:    10/24/21 1109 10/24/21 1126   BP: 110/60 116/64   Site: Right Upper Arm Right Upper Arm   Position: Sitting    Cuff Size: Large Adult Large Adult   Pulse: 72    SpO2: 98%    Weight: 296 lb 8 oz (134.5 kg)    Height: 5\' 5"  (1.651 m)         VITALS:  BP 110/60 (Site: Right Upper Arm, Position: Sitting, Cuff Size: Large Adult)    Pulse 72    Ht 5\' 5"  (1.651 m)    Wt 296 lb 8 oz (134.5 kg)    SpO2 98%    BMI 49.34 kg/m??   CONSTITUTIONAL: Cooperative, no apparent distress, and appears well nourished / obese  NEUROLOGIC:  Awake and orientated to person, place and time.  PSYCH: Calm affect.  SKIN: Warm and dry: Rt radial unremarkable.  HEENT: Sclera non-icteric, normocephalic, neck supple, no elevation of JVP, normal carotid pulses with no bruits and thyroid normal size.  LUNGS:  No increased  work of breathing and clear to auscultation, no crackles or wheezing  CARDIOVASCULAR:  Regular rate 80 and rhythm with no murmurs, gallops, rubs, or abnormal heart sounds, normal PMI.The apical impulses not displaced  JVP less than 8 cm H2O  Heart tones are crisp and normal  Cervical veins are not engorged  The carotid upstroke is normal in amplitude and contour without delay or bruit  JVP is not elevated  ABDOMEN:  Normal bowel sounds, non-distended and non-tender to palpation  EXT: no edema, no calf tenderness. Pulses are present bilaterally.    DATA:    Lab Results   Component Value Date    ALT 47 02/13/2021    ALT 47 02/13/2021    AST 53 (H) 02/13/2021    AST 53 (H) 02/13/2021    ALKPHOS 71 02/13/2021    ALKPHOS 71 02/13/2021    BILITOT 0.7 02/13/2021    BILITOT 0.7 02/13/2021     Lab Results   Component Value Date    CREATININE 1.3 (H) 10/14/2021    BUN 25 (H) 10/14/2021    NA 142 10/14/2021    K 4.4 10/14/2021    CL 107 10/14/2021    CO2 21 10/14/2021  Lab Results   Component Value Date    WBC 8.3 10/14/2021    HGB 12.3 10/14/2021    HCT 37.1 10/14/2021    MCV 89.2 10/14/2021    PLT 218 10/14/2021     No components found for: CHLPL  Lab Results   Component Value Date    TRIG 116 02/13/2021    TRIG 134 08/09/2019    TRIG 138 07/13/2019     Lab Results   Component Value Date    HDL 48 (L) 02/13/2021    HDL 43 08/09/2019    HDL 44 07/13/2019     Lab Results   Component Value Date    LDLCALC 78 02/13/2021    LDLCALC 80 08/09/2019    LDLCALC 82 07/13/2019     Lab Results   Component Value Date    LABVLDL 27 08/09/2019    LABVLDL 28 07/13/2019    LABVLDL 27 03/23/2019     Radiology Review:  Pertinent images / reports were reviewed as a part of this visit and reveals the following:    Cardiac cath: 10/14/21:  Anatomy:   LM-normal  LAD-patent proximal stent with 10% in-stent restenosis, 30% mid with mild myocardial bridging  Cx-10% to 20% proximal  OM1-normal  RCA-normal  LVEF-70%     Impression:  1.  Mild  nonobstructive CAD.  2.  Hyperdynamic LV systolic function.  3.  High resting heart rate.     Plan:  1.  Recommend addition of diltiazem CD 180 mg daily.  To accommodate addition of diltiazem reduce Ranexa to 500 mg twice daily.  Also would decrease lisinopril to 20 mg daily to allow for enough blood pressure to accommodate addition of diltiazem.  Diltiazem may be helpful should there be a component of microvascular angina.    Cardiac cath 02/18/21 ([facility])   Left Main:  Normal   Left Anterior Descending:  Patent proximal stent. Mid 30% stenosis,   irregular appearing in some views. FFR 0.89.   Circumflex:  No obstructive disease   Right Coronary:  No obstructive disease   Left Ventriculogram:  LVEF 55-60%   Hemodynamics (mm Hg):   Left Ventricular Pressure:  102/49   Central Aortic Pressure:  125/0, 6   Conclusions:   -No obstructive CAD       Lexiscan myoview 01/29/21 ([facility])  Interpetation Summary:   1. No no change of baseline chest pain after regadenoson injection.   2. No evidence of stress-induced ischemia on EKG.   3. There is evidence of ischemia in the territory subtended by the left anterior descending coronary artery.   4. Normal LV systolic function.     Angiogram: [facility] on 08/09/19      Findings/Result     LM Normal   LAD Widely patent stent, 50% ostial D1 jailed by stent (no change)   Cx Normal   RI NA   RCA Normal   LVEDP 6   LVG 60%      Intervention:         None     Post Cath Dx:       Nonobstructive CAD, patent stent LAD                 Angiogram 05/07/17:  Anatomy:   LM-normal  LAD-normal, widely patent stent mid  Cx-normal  OM1- normal  RCA-normal, right dominant  RPDA- normal  LVEF- 60%     Impression:  1.  Normal coronary artery anatomy.  2.  Widely  patent mid LAD stent.  3.  Normal LV systolic function.  4.  False positive stress test.  Likely breast artifact.     Plan:  1.  Medical management.  2.  She needs workup of noncardiac causes of chest pain.  She now has to stress test with similar  abnormalities most consistent with breast artifact.  The majority of her coronary angiograms have demonstrated normal anatomy except for the one that had a 70% mid LAD stenosis which was stented.    myoview stress: 03/29/19:  Summary     Normal myocardial perfusion.     Normal LV size and systolic function.           Last Echo: May '17:  Summary   Normal left ventricle size and systolic function with an estimated ejection fraction of 65%.   There is mild concentric left ventricular hypertrophy.   Mild mitral regurgitation is present.   The aortic valve appears sclerotic but opens well.   There is mild tricuspid regurgitation with RVSP estimated at 42 mmHg.   Mild pulmonic regurgitation present.   Definity was used to better delineate endocardium.    Cardiac Cath 07/17/16:  Anatomy:   LM-normal  LAD-widely patent stent otherwise normal  Cx-normal, dominant  OM1- normal  RCA-normal  LVEF- 70%, LVEDP 15 mmHg     Impression:  1.  Widely patent LAD stent otherwise normal coronary arteries  2.  Hyperdynamic LV systolic function  3.  Non-cardiac chest pain     R/L Cardiac Cath 03/31/16:  Anatomy:   LM-normal   LAD-70% mid with FFR 0.77  Cx-normal, codominant  OM1- normal  RCA-normal  RPDA- normal  LVEF- 70%, LVEDP 19   PCI: LAD 70% to 0% with 3.25 mm x 18 mm Xience Alpine drug-eluting stent    Assessment:      Diagnosis Orders   1. Coronary artery disease involving native coronary artery of native heart without angina pectoris   ~stable : patent stent by cath Nov '22 ; mild myocardial bridging noted with hyperdynamic LVF  ~feels better on diltiazem   ~patent stent LAD with 50% ostial diag - jailed '20 and unchanged from previous cath '18  ~hx of PTCA LAD April '17 ; patent on 3 subsequent cath(s) from Aug '17, '18 and Sept '20  ~DAPT / statin / BB / CCB / Ranexa    2. Chronic diastolic congestive heart failure ([place])  ~compensated  ~takes diuretic prn    ~spironolactone / lisinopril   ~hx of PE with [place] filter and OSA     3. primary hypertension   ~controlled on current regimen        I had the opportunity to review the clinical symptoms and presentation of Rachael Mays.   Plan:     1. Continue present management (carotid US as scheduled 12/5)  2. F/U in 4 months    Overall the patient is stable from CV standpoint    I have addresed the patient's cardiac risk factors and adjusted pharmacologic treatment as needed. In addition, I have reinforced the need for patient directed risk factor modification.    Further evaluation will be based upon the patient's clinical course and testing results.    All questions and concerns were addressed to the patient/family. Alternatives to my treatment were discussed.      The patient is not currently smoking. The risks related to smoking were reviewed with the patient. Recommend maintaining a smoke-free lifestyle.  Patient is on a beta-blocker  Patient is on an ace-i  Patient is on a statin    Dual Antiplatelet therapy has been recommended / prescribed for this patient. Education conducted on adverse reactions including bleeding was discussed.    The patient verbalizes understanding not to stop medications without discussing with [place].    Discussed exercise: 30-60 minutes 7 days/week  Discussed Low saturated fat diet.     Thank you for allowing to [place] to participate in the care of Rachael Mays.    Luiz Iron, APRN    Documentation of today's visit sent to PCP

## 2021-11-10 ENCOUNTER — Ambulatory Visit: Payer: PRIVATE HEALTH INSURANCE | Primary: Student in an Organized Health Care Education/Training Program

## 2021-11-10 DIAGNOSIS — I6521 Occlusion and stenosis of right carotid artery: Secondary | ICD-10-CM

## 2021-11-11 NOTE — Telephone Encounter (Signed)
I spoke to patient with doppler results per kjc . Patient verbalized understanding. Advised patient to call back with any questions.

## 2021-11-11 NOTE — Telephone Encounter (Signed)
-----   Message from Derek Mound, RN sent at 11/11/2021  3:57 PM EST -----  Please let patient know that [facility] reviewed her carotid dopplers. Mild-moderate plaque is seen, nothing to do at this time. We will repeat it yearly.

## 2021-12-17 ENCOUNTER — Ambulatory Visit
Payer: PRIVATE HEALTH INSURANCE | Attending: Family | Primary: Student in an Organized Health Care Education/Training Program

## 2021-12-17 NOTE — Progress Notes (Signed)
Diagnosis: [x]  OSA (G47.33) []  CSA (G47.31) []  Apnea (G47.30)   Length of Need: [x]  15 Months []  99 Months []  Other:   Machine (DAW!): []  Respironics Dream Station      Auto []  ResMed AirSense     Auto []  Other:     []   CPAP (K7425) []  Bilevel (Z5638)   Mode: []  Auto []  Spontaneous    Mode: []  Auto []  Spontaneous            Comfort Settings:      Humidifier: []  Heated (V5643)        [x]  Water chamber replacement (A7046)/ 1 per 6 months        Mask:   [x]  Nasal (P2951) /1 per 3 months []  Full Face (A7030) /1 per 3 months   [x]  Patient choice -Size and fit mask []  Patient Choice - Size and fit mask   []  Dispense: []  Dispense:   [x]  Headgear (O8416) / 1 per 3 months []  Headgear (S0630) / 1 per 3 months   [x]  Replacement Nasal Cushion (A7032)/2 per month []  Interface Replacement (A7031)/1 per month   []  Replacement Nasal Pillows (A7033)/2 per month         Tubing: [x]  Heated (A4604)/1 per 3 months    []  Standard (A7037)/1 per 3 months []  Other:           Filters: [x]  Non-disposable (A7039)/1 per 6 months     [x]  Ultra-Fine, Disposable (A7038)/2 per month        Miscellaneous: []  Chin Strap (A7036)/ 1 per 6 months []  O2 bleed-in:        LPM   []  Oxymetry on CPAP/Bilevel []   Other:         Start Order Date: 12/17/21    MEDICAL JUSTIFICATION:  I, the undersigned, certify that the above prescribed supplies are medically necessary for this patient???s wellbeing.  In my opinion, the supplies are both reasonable and necessary in reference to accepted standards of medicalpractice in treatment of this patient???s condition.    Lennie Hummer NP    NPI: [id number]       Order Signed Date: 12/17/21  [facility]    [facility]  [address]  [place] [address]                                    [place] [address]  Phone: [phone]    Fax:  [place]  11-27-1956  [place] [address]  [phone] (home)   [phone] (mobile)      Insurance Info (confirm with patient if correct):  Payer/Plan Subscr DOB Sex Relation Sub. Ins. ID Effective Group Num

## 2021-12-17 NOTE — Assessment & Plan Note (Signed)
Chronic- Stable.  Discussed the importance of treating obstructive sleep apnea as part of the management of this disorder.  Cont any meds per PCP and other physicians.

## 2021-12-17 NOTE — Assessment & Plan Note (Signed)
Chronic-not stable:  Discussed importance of treating obstructive sleep apnea and getting sufficient sleep to assist with weight control.  Encouraged her to work on weight loss through diet and exercise. Recommended DASH or Mediterranean diets.

## 2021-12-17 NOTE — Progress Notes (Signed)
Magdalene River MD  Lennie Hummer CNP [place]  [address]  P- [phone]   [facility] [place] [address]  F- [phone]     [facility] [place] [facility]  [place] [facility] [place] [facility]  [place] [id number]  [phone]      Assessment/Plan:      1. Obstructive sleep apnea  Assessment & Plan:   Chronic-with progression/exacerbation: Reviewed and analyzed results of physiologic download from patient's machine and reviewed with patient.  Supplies and parts as needed for her machine.  These are medically necessary.  Limit caffeine use after 3pm. Based on the analyzed data will change following settings: EPAP min increased to 11, PS min increased to 3, PS max increased to 6, Humidity increased to 4, tube temp increased to 3. Will adjust pressure to recommendations of her titration study in 2016. May need to consider an in lab titration if no improvement due to weight gain and increased symptoms. Discussed how to adjust the moisture settings on her machine. No driving when drowsy. Will see her back in 3 months. Encouraged her to contact the office with any questions or concerns.     2. Coronary artery disease due to lipid rich plaque  Assessment & Plan:  Chronic- Stable.  Discussed the importance of treating obstructive sleep apnea as part of the management of this disorder.  Cont any meds per PCP and other physicians.    3. Chronic diastolic congestive heart failure ([place])  Assessment & Plan:  Chronic- Stable.  Discussed the importance of treating obstructive sleep apnea as part of the management of this disorder.  Cont any meds per PCP and other physicians.    4. Essential hypertension  Assessment & Plan:  Chronic- Stable.  Discussed the importance of treating obstructive sleep apnea as part of the management of this disorder.  Cont any meds per PCP and other physicians.    5. Pulmonary  hypertension  Assessment & Plan:  Chronic- Stable.  Discussed the importance of treating obstructive sleep apnea as part of the management of this disorder.  Cont any meds per PCP and other physicians.    6. Morbid obesity with body mass index (BMI) of 45.0 to 49.9 in adult [facility])  Assessment & Plan:   Chronic-not stable:  Discussed importance of treating obstructive sleep apnea and getting sufficient sleep to assist with weight control.  Encouraged her to work on weight loss through diet and exercise. Recommended DASH or Mediterranean diets.      Reviewed, analyzed, and documented physiologic data from patient's PAP machine.    This information was analyzed to assess complexity and medical decision making in regards to further testing and management.    The primary encounter diagnosis was Obstructive sleep apnea. Diagnoses of Coronary artery disease due to lipid rich plaque, Chronic diastolic congestive heart failure ([place]), Essential hypertension, Pulmonary hypertension, and Morbid obesity with body mass index (BMI) of 45.0 to 49.9 in adult [facility]) were also pertinent to this visit.  The chronic medical conditions listed are directly related to the primary diagnosis listed above.  The management of the primary diagnosis affects the secondary diagnosis and vice versa.     Subjective:   Subjective   Patient ID: Rachael Mays is a 66 y.o. female.    Chief Complaint   Patient presents with    Sleep  Apnea         HPI:  Machine Modem/Download Info:  Compliance (hours/night): 7.4 hrs/night  % of nights >= 4 hrs: 91.1 %  Download AHI (/hour): 0.6 /HR   Average IPAP Pressure: 10 cmH2O  Average EPAP Pressure: 7 cmH2O         AUTO BIPAP - Settings ([place])  IPAP Max: 25 cmH2O  EPAP Min: 7 cmH2O  Pressure Support Min: 1  Pressure Support Max: 5             Comfort Settings  Humidity Level (0-8): 2  Flex/EPR (0-3): 3 PAP Mask  Mask Type: Nasal mask     Rachael Mays presents today for follow-up for sleep apnea. she reports she  is doing well with her machine.  She has received her replacement machine for the manufacturer recall.  The pressure on her machine is comfortable. She feels that she is sleeping well but always feels tired. she denies headaches, congestion, nosebleeds, dryness, aerophagia, or drowsiness while driving. her mask is comfortable and is fitting well. She would like to switch DME to [facility].    Loyal    Epworth - Epworth Sleepiness Score: 20    Social History     Socioeconomic History    Marital status: Divorced     Spouse name: [name]    Number of children: 2    Years of education: 12    Highest education level: Not on file   Occupational History    Not on file   Tobacco Use    Smoking status: Former     Packs/day: 0.50     Years: 40.00     Pack years: 20.00     Types: Cigarettes     Quit date: 08/07/2012     Years since quitting: 9.3    Smokeless tobacco: Former     Quit date: 06/16/2013    Tobacco comments:     started to smoke at age 9 / only smoked 0.5 p.p.d    Vaping Use    Vaping Use: Never used   Substance and Sexual Activity    Alcohol use: No     Alcohol/week: 0.0 standard drinks    Drug use: No    Sexual activity: Not Currently   Other Topics Concern    Not on file   Social History Narrative    Works as Scientist, water quality at [employer] Strain: Not on file   Food Insecurity: Not on file   Transportation Needs: Not on file   Physical Activity: Not on file   Stress: Not on file   Social Connections: Not on file   Intimate Partner Violence: Not on file   Housing Stability: Not on file       Current Outpatient Medications   Medication Instructions    acetic acid-hydrocortisone (VOSOL-HC) 1-2 % otic solution 4 drops in affected ear or ears every 3 hours while awake    albuterol sulfate HFA (PROVENTIL HFA) 108 (90 Base) MCG/ACT inhaler 2 puffs, Inhalation, EVERY 6 HOURS PRN    aspirin 81 mg, Oral, DAILY    calcium citrate-vitamin D (CITRICAL + D) 315-250 MG-UNIT  TABS per tablet 2 tablets, Oral, 2 TIMES DAILY    cetirizine (ZYRTEC) 10 mg, Oral, DAILY    clotrimazole-betamethasone (LOTRISONE) 1-0.05 % cream Apply topically 2 times daily.    Coenzyme Q10 (CO  Q10) 200 MG CAPS Oral    diclofenac sodium (VOLTAREN) 1 % GEL APPLY 4 GRAMS TO AFFECTED AREA(S) TWO TIMES A DAY    dilTIAZem (CARDIZEM CD) 180 mg, Oral, DAILY    fluocinonide (LIDEX) 0.05 % external solution APPLY SPARINGLY TO THE BACK OF THE SCALP DAILY AS NEEDED FOR ITCHING    lactulose (CHRONULAC) 10 GM/15ML solution lactulose 10 gram/15 mL oral solution   TAKE 15MLS BY MOUTH EVERY DAY AS NEEDED    lidocaine (LIDODERM) 5 % 1 patch, TransDERmal, DAILY, 12 hours on, 12 hours off.    linaclotide (LINZESS) 145 mcg, Oral, DAILY BEFORE BREAKFAST    lisinopril (PRINIVIL;ZESTRIL) 20 MG tablet TAKE ONE TABLET BY MOUTH DAILY    magnesium (MAGNESIUM-OXIDE) 350 mg, Oral    meclizine (ANTIVERT) 25 MG tablet TAKE ONE TABLET BY MOUTH THREE TIMES A DAY AS NEEDED FOR DIZZINESS    montelukast (SINGULAIR) 10 MG tablet TAKE ONE TABLET BY MOUTH DAILY. **MUST CALL MD FOR APPOINTMENT    Multiple Vitamins-Minerals (MULTIVITAMIN PO) 1 tablet, Oral, DAILY    nitroGLYCERIN (NITROSTAT) 0.4 MG SL tablet up to max of 3 total doses. If no relief after 1 dose, call 911.    omeprazole (PRILOSEC) 40 MG delayed release capsule TAKE ONE CAPSULE BY MOUTH DAILY    ondansetron (ZOFRAN) 4 MG tablet TAKE ONE TABLET BY MOUTH THREE TIMES A DAY AS NEEDED FOR NAUSEA AND VOMITING    oxyCODONE-acetaminophen (PERCOCET) 7.5-325 MG per tablet No dose, route, or frequency recorded.    ranolazine (RANEXA) 500 mg, Oral, 2 TIMES DAILY    rosuvastatin (CRESTOR) 20 MG tablet DAILY    spironolactone (ALDACTONE) 25 MG tablet TAKE ONE AND ONE-HALF TABLET BY MOUTH DAILY    topiramate (TOPAMAX) 25 MG tablet Nightly    topiramate (TOPAMAX) 50 mg, Oral, DAILY    torsemide (DEMADEX) 20 mg, Oral, DAILY PRN    triamcinolone (KENALOG) 0.1 % cream APPLY TO AFFECTED AREA(S) TWO TIMES A  DAY FOR UP TO 2 WEEKS OR UNTIL IMPROVED    triamcinolone (KENALOG) 0.1 % cream Apply to affected area twice daily for up to 2 weeks or until improved.    Trulicity 1.5 mg, SubCUTAneous, WEEKLY    Vitamin D3 2,000 Units, Oral, DAILY          Vitals:  Weight BMI   Wt Readings from Last 3 Encounters:   12/17/21 291 lb (132 kg)   10/24/21 296 lb 8 oz (134.5 kg)   10/14/21 (!) 311 lb (141.1 kg)    Body mass index is 48.42 kg/m??.     BP HR SaO2   BP Readings from Last 3 Encounters:   12/17/21 (!) 100/50   10/24/21 116/64   10/14/21 132/63    Pulse Readings from Last 3 Encounters:   12/17/21 78   10/24/21 72   10/14/21 78    SpO2 Readings from Last 3 Encounters:   12/17/21 98%   10/24/21 98%   06/18/21 97%        Electronically signed by Lennie Hummer, APRN on 12/17/2021 at 2:39 PM

## 2021-12-17 NOTE — Assessment & Plan Note (Signed)
Chronic-with progression/exacerbation: Reviewed and analyzed results of physiologic download from patient's machine and reviewed with patient.  Supplies and parts as needed for her machine.  These are medically necessary.  Limit caffeine use after 3pm. Based on the analyzed data will change following settings: EPAP min increased to 11, PS min increased to 3, PS max increased to 6, Humidity increased to 4, tube temp increased to 3. Will adjust pressure to recommendations of her titration study in 2016. May need to consider an in lab titration if no improvement due to weight gain and increased symptoms. Discussed how to adjust the moisture settings on her machine. No driving when drowsy. Will see her back in 3 months. Encouraged her to contact the office with any questions or concerns.

## 2021-12-26 NOTE — Progress Notes (Signed)
Patient reached __X__ yes  _____ no   VM instructions left ____ yes   phone number ________                                ____ no-office notified          Date _2/2/23________  Time __0730_____  Arrival _0600  hosp-endo_____    Nothing to eat or drink after midnight-follow your doctors prep instructions-this may include taking a second dose of your prep after midnight  Responsible adult 69 or older to stay on site while you are here-drive you home-stay with you after  Follow any instructions your doctors office has given you  Bring a complete list of all your medications and supplements including name,dose,how often taken the day of your procedure  If you normally take the following medications in the morning please do so the AM of your procedure with a small sip of water       Heart,blood pressure,seizure,thyroid or breathing medications-use your inhalers-bring any rescue inhalers with you DOS       DO NOT take blood pressure medications ending in "artan" or "pril" the AM of procedure or evening prior-DO NOT TAKE LISINOPRIL  Dr Denton Brick patients are not to take any medications the AM of surgery  Take half or your normal dose of any long acting insulins the night before your procedure-do not take any diabetic medications the AM of procedure  Follow your doctors instructions regarding stopping or taking  any blood thinners-if you do not have instructions-call them  Any questions call your doctor  Other ___take ranexa, diltiazem am of procedure___________________________________________________________    Ulis Rias POLICY(subject to change)             The current policy is 2 visitors per patient.There are no children allowed.Mask at discretion of facility. Visiting hours are 8a-8p.Overnight visitors will be at the discretion of the nurse. All policies are subject to change.

## 2021-12-27 ENCOUNTER — Encounter

## 2022-01-04 ENCOUNTER — Encounter

## 2022-01-20 NOTE — Telephone Encounter (Signed)
Amber from [employer] Dr Marcelo Baldy MD office called wanting to inform Dr Marshell Garfinkel that she took the patient off of her lisinopril and spirolactone, due to the pts kidney functions and the is scheduled to see a nephrologist.    Pls advise thank you

## 2022-01-21 NOTE — Telephone Encounter (Signed)
[facility] aware and OK with plan.

## 2022-01-25 ENCOUNTER — Encounter

## 2022-01-26 ENCOUNTER — Ambulatory Visit
Admit: 2022-01-26 | Discharge: 2022-01-26 | Payer: PRIVATE HEALTH INSURANCE | Attending: Nephrology | Primary: Sports Medicine

## 2022-01-26 DIAGNOSIS — I10 Essential (primary) hypertension: Secondary | ICD-10-CM

## 2022-01-26 MED ORDER — OMEPRAZOLE 40 MG PO CPDR
40 | ORAL_CAPSULE | ORAL | 0 refills | Status: DC
Start: 2022-01-26 — End: 2024-06-05

## 2022-01-26 MED ORDER — DILTIAZEM HCL ER COATED BEADS 180 MG PO CP24
180 MG | ORAL_CAPSULE | ORAL | 3 refills | Status: AC
Start: 2022-01-26 — End: 2023-02-01

## 2022-01-26 NOTE — Progress Notes (Signed)
Office : [phone]     Fax :[phone]      Nephrology Consult Note    Reason for Consult:  worsening renal function   Requesting Physician:  DrGrandville Silos    Chief Complaint:  declining GFR   History Obtained From:  Patient     History of Present Illness:    This is a 66 y.o. female who presents to the office for evaluation of  worsening renal function .  H/o DM 2 - none   H/o HTN - now BP better after loss of weight   H/o Renal stones - none   H/o renal disease in family   H/o CAD - h/o Stent in past. H/o CHF     H/o smoking .    Recently stopped lisinopril and spironolactone      Pt denies any hx of heavy or prolonged NSAID use.     No tea coloured urine .   Medication reviewed and noted the use of ace/arb, diuretic.    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/2017    Depression     GERD (gastroesophageal reflux disease)     NO LONGER SINCE LAP    GERD (gastroesophageal reflux disease) 01/23/2009    Hx of blood clots     Hyperlipidemia     hx; resolved with lap band    Hypertension     Idiopathic peripheral neuropathy 03/26/2017    Melanoma ([place])     upper back    MRSA (methicillin resistant staph aureus) culture positive 10/17/2019    leg abscess    Obesity     hx of; had lap band    Obstructive sleep apnea 09/18/2015    uses bi-pap    Obstructive sleep apnea (adult) (pediatric) 09/18/2015    Pulmonary embolism ([place])     Stenosis of right carotid artery 06/16/2021       Past Surgical History:        Procedure Laterality Date    BACK SURGERY      neck  plates    BREAST SURGERY      left lumpectomy    CERVICAL FUSION      CESAREAN SECTION      CHOLECYSTECTOMY      COLONOSCOPY      COLONOSCOPY  04/08/2010    COLONOSCOPY N/A 04/18/2021     COLONOSCOPY DIAGNOSTIC performed by Norwood Levo, MD at [place] CATH LAB PROCEDURE      DILATATION, ESOPHAGUS      ENDOSCOPY, COLON, DIAGNOSTIC      HYSTERECTOMY (CERVIX STATUS UNKNOWN)      HYSTERECTOMY, VAGINAL      LAP BAND  05/01/2008    Dr. Nicole Cella    OTHER SURGICAL HISTORY      [place] Filter: curently in place    OTHER SURGICAL HISTORY  07/05/2013    lap band removal    SHOULDER SURGERY      SKIN CANCER EXCISION  12/29/2017    UPPER GASTROINTESTINAL ENDOSCOPY  05/19/2013    UPPER GASTROINTESTINAL ENDOSCOPY  07/21/2013    ESOPHAGEAL STENT PLACEMENT    UPPER GASTROINTESTINAL ENDOSCOPY N/A 07/31/2014    Esophagogastroduodenoscopy with esophageal balloon dilation    UPPER GASTROINTESTINAL ENDOSCOPY N/A 04/18/2021    EGD DILATION BALLOON performed by Norwood Levo, MD at [facility]  04/2015    Lucendia Herrlich, celiac artery angiogram, normal abd arteries       Current Medications:    Current Outpatient Medications   Medication Sig Dispense Refill    omeprazole (PRILOSEC) 40 MG delayed release capsule TAKE ONE CAPSULE BY MOUTH DAILY 30 capsule 0    dilTIAZem (CARDIZEM CD) 180 MG extended release capsule Take 1 capsule by mouth daily 30 capsule 3    montelukast (SINGULAIR) 10 MG tablet TAKE ONE TABLET BY MOUTH DAILY. **MUST CALL MD FOR APPOINTMENT 30 tablet 5    fluocinonide (LIDEX) 0.05 % external solution APPLY SPARINGLY TO THE BACK OF THE SCALP DAILY AS NEEDED FOR ITCHING 60 mL 1    oxyCODONE-acetaminophen (PERCOCET) 7.5-325 MG per tablet as needed.      magnesium (MAGNESIUM-OXIDE) 250 MG TABS tablet Take 350 mg by mouth daily      Coenzyme Q10 (CO Q10) 200 MG CAPS Take by mouth daily (with breakfast)      clotrimazole-betamethasone (LOTRISONE) 1-0.05 % cream Apply topically 2 times daily. 45 g 0    diclofenac sodium (VOLTAREN) 1 % GEL APPLY 4 GRAMS TO AFFECTED AREA(S) TWO TIMES A DAY 100 g 1    Cholecalciferol (VITAMIN D3) 50  MCG (2000 UT) CAPS Take 1 capsule by mouth daily 90 capsule 3    albuterol sulfate HFA (PROVENTIL HFA) 108 (90 Base) MCG/ACT inhaler Inhale 2 puffs into the lungs every 6 hours as needed for Wheezing 3 Inhaler 3    lidocaine (LIDODERM) 5 % Place 1 patch onto the skin daily 12 hours on, 12 hours off. (Patient taking differently: Place 1 patch onto the skin as needed 12 hours on, 12 hours off.) 30 patch 0    aspirin 81 MG EC tablet Take 1 tablet by mouth daily 30 tablet 11    Multiple Vitamins-Minerals (MULTIVITAMIN PO) Take 1 tablet by mouth daily       Tirzepatide (MOUNJARO) 5 MG/0.5ML SOPN SC injection once a week      triamcinolone (KENALOG) 0.1 % cream Apply to affected area twice daily for up to 2 weeks or until improved. 15 g 2    topiramate (TOPAMAX) 25 MG tablet Take 50 mg by mouth 2 times daily      rosuvastatin (CRESTOR) 20 MG tablet daily      nitroGLYCERIN (NITROSTAT) 0.4 MG SL tablet up to max of 3 total doses. If no relief after 1 dose, call 911. (Patient not taking: No sig reported) 25 tablet 3    ranolazine (RANEXA) 500 MG extended release tablet Take 1 tablet by mouth 2 times daily (Patient not taking: Reported on 01/26/2022) 180 tablet 3    calcium citrate-vitamin D (CITRICAL + D) 315-250 MG-UNIT TABS per tablet Take 2 tablets by mouth 2 times daily      triamcinolone (KENALOG) 0.1 % cream APPLY TO AFFECTED AREA(S) TWO TIMES A DAY FOR UP TO 2 WEEKS OR UNTIL IMPROVED (Patient taking differently: as needed) 80 g 0    acetic acid-hydrocortisone (VOSOL-HC) 1-2 % otic solution 4 drops in affected ear or ears every 3 hours while awake (Patient taking differently: as needed e) 15 mL 1     No  current facility-administered medications for this visit.       Allergies:  Ibuprofen, Seasonal, and Talwin [pentazocine]    Social History:   Social History     Socioeconomic History    Marital status: Divorced     Spouse name: [name]    Number of children: 2    Years of education: 12    Highest education level: Not on  file   Occupational History    Not on file   Tobacco Use    Smoking status: Former     Packs/day: 0.50     Years: 40.00     Pack years: 20.00     Types: Cigarettes     Quit date: 08/07/2012     Years since quitting: 9.4    Smokeless tobacco: Former     Quit date: 06/16/2013    Tobacco comments:     started to smoke at age 67 / only smoked 0.5 p.p.d    Vaping Use    Vaping Use: Never used   Substance and Sexual Activity    Alcohol use: No     Alcohol/week: 0.0 standard drinks    Drug use: No    Sexual activity: Not Currently   Other Topics Concern    Not on file   Social History Narrative    Works as Scientist, water quality at [employer] Strain: Not on Training and development officer Insecurity: Not on file   Transportation Needs: Not on file   Physical Activity: Not on file   Stress: Not on file   Social Connections: Not on file   Intimate Partner Violence: Not on file   Housing Stability: Not on file       Family History:   Family History   Problem Relation Age of Onset    Arthritis Mother     Cancer Mother     Depression Mother     High Blood Pressure Mother     Heart Disease Mother 20        MI     Ovarian Cancer Mother 67    Cancer Father 70        colon    Heart Disease Maternal Grandmother     Early Death Paternal Grandmother     Heart Disease Paternal Grandfather     Diabetes Other     High Blood Pressure Other     Obesity Other     Other Sister         OSA    Other Brother         OSA    Other Brother         OSA    Other Daughter         OSA       Review of Systems:    Constitutional: No fever, no chills, no lethargy, no weakness.  HEENT:  No headache, otalgia, itchy eyes, nasal discharge or sore throat.  Cardiac:  No chest pain, dyspnea, orthopnea or PND.  Chest:  No cough, phlegm or wheezing.  Abdomen:  No abdominal pain, nausea or vomiting.  Neuro:  No focal weakness, abnormal movements orseizure like activity.  Skin:   No rashes, no itching.  GU:   No hematuria, no pyuria, no  dysuria, no flank pain.  Extremities:  No swelling or joint pains.      Objective:  BP (!) 124/59  Pulse 70    Wt 288 lb (130.6 kg)    BMI 47.93 kg/m??     Physical Exam:  General appearance:Awake, alert, in no acute distress  Skin: warm and dry, no rash or erythema    Eyes: conjunctivae normal and sclera anicteric    ENT: :no thrush no pharyngeal congestion      Neck: without any JVD. No carotid bruits or thyromegaly.      Pulmonary: lungs are clear and without any wheezing or rhonchi     Cardiovascular:Normal S1 & S2, No S3 or  S4, No  Pericardial rub , No Murmur     Abdomen: soft nontender, bowel sounds present, no organomegaly,  No ascites      Extremities: no cyanosis, clubbing or edema    Labs:    CBC:   Lab Results   Component Value Date/Time    WBC 8.3 10/14/2021 08:40 AM    RBC 4.16 10/14/2021 08:40 AM    HGB 12.3 10/14/2021 08:40 AM    HCT 37.1 10/14/2021 08:40 AM    MCV 89.2 10/14/2021 08:40 AM    RDW 14.0 10/14/2021 08:40 AM    PLT 218 10/14/2021 08:40 AM    MPV 8.4 10/14/2021 08:40 AM      BMP:   Lab Results   Component Value Date/Time    NA 142 10/14/2021 08:40 AM    NA 136 02/13/2021 03:02 PM    NA 137 10/18/2019 05:42 AM    K 4.4 10/14/2021 08:40 AM    K 4.6 02/13/2021 03:02 PM    K 5.1 10/18/2019 05:42 AM    K 4.6 10/17/2019 06:07 AM    K 3.9 05/05/2017 03:21 AM    CL 107 10/14/2021 08:40 AM    CL 102 02/13/2021 03:02 PM    CL 101 10/18/2019 05:42 AM    CO2 21 10/14/2021 08:40 AM    CO2 27 02/13/2021 03:02 PM    CO2 25 10/18/2019 05:42 AM    BUN 25 10/14/2021 08:40 AM    BUN 20 02/13/2021 03:02 PM    BUN 17 10/18/2019 05:42 AM    CREATININE 1.3 10/14/2021 08:40 AM    CREATININE 1.08 02/13/2021 03:02 PM    CREATININE 1.1 10/18/2019 05:42 AM    GLUCOSE 123 10/14/2021 08:40 AM    GLUCOSE 119 02/13/2021 03:02 PM    GLUCOSE 140 10/18/2019 05:42 AM    CALCIUM 10.1 10/14/2021 08:40 AM    CALCIUM 10.0 02/13/2021 03:02 PM    CALCIUM 9.4 10/18/2019 05:42 AM      BNP:No results found for: BNP  PHOSPHORUS:     Lab Results   Component Value Date/Time    PHOS 3.8 08/21/2013 03:46 PM    PHOS 3.3 08/18/2013 08:13 AM    PHOS 3.5 08/17/2013 06:49 AM     MAGNESIUM:   Lab Results   Component Value Date/Time    MG 2.20 05/07/2017 04:45 AM     ALBUMIN:   Lab Results   Component Value Date/Time    LABALBU 4.2 02/13/2021 03:02 PM    LABALBU 4.2 02/13/2021 03:02 PM     IRON:    Lab Results   Component Value Date/Time    IRON 93 03/26/2010 05:35 PM     IRON SATURATION:    Lab Results   Component Value Date/Time    LABIRON 24 03/26/2010 05:35 PM     TIBC:    Lab Results   Component Value Date/Time  TIBC 397 03/26/2010 05:35 PM     FERRITIN:    Lab Results   Component Value Date/Time    FERRITIN 61.9 05/02/2009 05:40 PM     ANA:   Lab Results   Component Value Date    ANA Negative 07/26/2019       SPEP:   Lab Results   Component Value Date/Time    PROT 7.5 02/13/2021 03:02 PM    PROT 7.5 02/13/2021 03:02 PM    PROT 7.3 03/31/2011 03:07 PM    LABALPH 0.2 03/29/2017 03:33 PM    LABALPH 0.7 03/29/2017 03:33 PM    LABBETA 1.2 03/29/2017 03:33 PM    GAMGLOB 1.2 03/29/2017 03:33 PM     UPEP: No results found for: TPU   HEPBSAG:No results found for: HEPBSAG  HEPCAB:No results found for: HEPCAB  C3: No results found for: C3  C4: No results found for: C4  MPO ANCA: No results found for: MPO .  PR3 ANCA:  No results found for: PR3  PTH: No results found for: PTH    Urine Creatinine:    Lab Results   Component Value Date/Time    LABCREA 241.1 02/13/2021 03:04 PM     Urine Eosinophils: No results found for: UREO  Urine Protein:  No results found for: TPU  Urinalysis:  U/A:   Lab Results   Component Value Date/Time    NITRU NEGATIVE 02/13/2021 03:03 PM    COLORU Yellow 02/13/2021 03:03 PM    PHUR 5.5 11/25/2017 04:24 PM    LABCAST 0-1 Hyaline 07/09/2013 02:09 AM    WBCUA 4 07/16/2014 07:26 PM    RBCUA NEGATIVE 02/13/2021 03:03 PM    MUCUS PRESENT 02/13/2021 03:03 PM    YEAST 0 11/25/2017 04:24 PM    BACTERIA 0 11/25/2017 04:24 PM    BACTERIA 2+  07/16/2014 07:26 PM    CLARITYU Clear 02/13/2021 03:03 PM    SPECGRAV 1.025 02/13/2021 03:03 PM    LEUKOCYTESUR NEGATIVE 02/13/2021 03:03 PM    LEUKOCYTESUR 0 to 5 02/13/2021 03:03 PM    UROBILINOGEN 2+ (4-6 mg/dL) 02/13/2021 03:03 PM    BILIRUBINUR NEGATIVE 02/13/2021 03:03 PM    BLOODU Negative 11/25/2017 04:24 PM    GLUCOSEU NEGATIVE 02/13/2021 03:03 PM    KETUA NEGATIVE 02/13/2021 03:03 PM         Radiology:  Reviewed as available.    Assessment/Plan:    AKI   Hold lisinopril since BP is in normal  range     H/o CHF but EF improved   Stopped spironolactone     Rpt  kidney function again           Discussed with patient in detail.   Counseling done for diet and lifestyle changes.  Explained importance of compliance with medications           Time spent *min Reviewing the chart, conducting an interview, performing exam  and educating the patient on my assessment plan  Patient verbalized understanding       Thank you for the consultation.        Dorathy Daft, MD  Nephrology     [facility] Office : [address], [address] , [place] [address]  [facility] Office: [address], [place] [address]  [facility] Office: [address], [place] [address].  [facility] Office: [address], [place] [address]  Office : [phone]  Fax :[phone]

## 2022-01-26 NOTE — Telephone Encounter (Signed)
Last OV: 10/24/21 NPTS  Next OV: 02/18/22 [facility]  Last labs: 10/14/21 BMP   Last EKG: 10/14/21   Last filled:  dilTIAZem (CARDIZEM CD) 180 MG extended release capsule 30 capsule 3 10/14/2021     Sig - Route: Take 1 capsule by mouth daily - Oral    Sent to pharmacy as: dilTIAZem HCl ER Coated Beads 180 MG Oral Capsule Extended Release 24 Hour (CARDIZEM CD)    E-Prescribing Status: Receipt confirmed by pharmacy (10/14/2021 11:15 AM EST)

## 2022-02-17 NOTE — Patient Instructions (Addendum)
Have labs drawn today  We will call you once Dr Susy Manor and Dr Ashley Royalty discuss a dose for spironolactone.   Check your cholesterol labs soon (fasting labs)   Follow up in 6 months

## 2022-02-18 ENCOUNTER — Inpatient Hospital Stay: Payer: PRIVATE HEALTH INSURANCE | Primary: Sports Medicine

## 2022-02-18 ENCOUNTER — Encounter
Admit: 2022-02-18 | Discharge: 2022-02-18 | Payer: PRIVATE HEALTH INSURANCE | Attending: Cardiovascular Disease | Primary: Sports Medicine

## 2022-02-18 DIAGNOSIS — E785 Hyperlipidemia, unspecified: Secondary | ICD-10-CM

## 2022-02-18 DIAGNOSIS — Z955 Presence of coronary angioplasty implant and graft: Secondary | ICD-10-CM

## 2022-02-18 NOTE — Progress Notes (Signed)
[place]    02/18/2022    Rachael Mays (DOB:  1956/09/24) is a 66 y.o. female is here for follow up and management of CAD and modifiable risk factors.    Referring Provider: Myer Peer, DO    HISTORY:  Rachael Mays has a history of coronary artery disease s/p DES of LAD (03/2016), hypertension, hyperlipidemia, diastolic heart failure, and carotid artery stenosis.  Rachael Mays has chronic chest pain and shortness of breath that did not resolve after coronary intervention in 03/2016.  RHC (2017) . Additional history includes DVT/PE,  s/p lap band surgery in 2014. S/p dilation of esophagus 04/2021    02/18/21 LHC showed patent proximal LAD stent with mid 30% stenosis in some views and FFR 0.89.  Medical management was recommended.  LHC 11/22 mild nonobstructive cad with mild myocardial bridging. 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     Today Rachael Mays reports that Rachael Mays was taken off of spironolactone by nephrology for her kidney function. Rachael Mays had been losing weight and doing really well. Rachael Mays has recently put 10 lbs on of "water weight"       REVIEW OF SYSTEMS:  A complete review of systems was reviewed and is negative except as noted in the history of present illness.    Prior to Visit Medications    Medication Sig Taking? Authorizing Provider   dilTIAZem (CARDIZEM CD) 180 MG extended release capsule TAKE ONE CAPSULE BY MOUTH DAILY Yes Leodis Binet, MD   omeprazole (PRILOSEC) 40 MG delayed release capsule TAKE ONE CAPSULE BY MOUTH DAILY Yes [name] Leigh Wojciechowski, APRN - NP   Tirzepatide (MOUNJARO) 5 MG/0.5ML SOPN SC injection once a week Yes Historical Provider, MD   topiramate (TOPAMAX) 25 MG tablet Take 50 mg by mouth 2 times daily Yes Historical Provider, MD   montelukast (SINGULAIR) 10 MG tablet TAKE ONE TABLET BY MOUTH DAILY. **MUST CALL MD FOR APPOINTMENT Yes [name] Leigh Wojciechowski, APRN - NP   fluocinonide (LIDEX) 0.05 % external solution APPLY SPARINGLY TO THE BACK OF THE SCALP  DAILY AS NEEDED FOR ITCHING Yes Francisca December, MD   oxyCODONE-acetaminophen (PERCOCET) 7.5-325 MG per tablet as needed. Yes Historical Provider, MD   rosuvastatin (CRESTOR) 20 MG tablet daily Yes Historical Provider, MD   magnesium (MAGNESIUM-OXIDE) 250 MG TABS tablet Take 350 mg by mouth daily Yes Historical Provider, MD   Coenzyme Q10 (CO Q10) 200 MG CAPS Take by mouth daily (with breakfast) Yes Historical Provider, MD   nitroGLYCERIN (NITROSTAT) 0.4 MG SL tablet up to max of 3 total doses. If no relief after 1 dose, call 911. Yes Leodis Binet, MD   ranolazine (RANEXA) 500 MG extended release tablet Take 1 tablet by mouth 2 times daily Yes Leodis Binet, MD   clotrimazole-betamethasone (LOTRISONE) 1-0.05 % cream Apply topically 2 times daily. Yes [name] Leigh Wojciechowski, APRN - NP   triamcinolone (KENALOG) 0.1 % cream APPLY TO AFFECTED AREA(S) TWO TIMES A DAY FOR UP TO 2 WEEKS OR UNTIL IMPROVED  Patient taking differently: as needed Yes Francisca December, MD   diclofenac sodium (VOLTAREN) 1 % GEL APPLY 4 GRAMS TO AFFECTED AREA(S) TWO TIMES A DAY Yes Jamal Collin, MD   Cholecalciferol (VITAMIN D3) 50 MCG (2000 UT) CAPS Take 1 capsule by mouth daily Yes [name] Leigh Wojciechowski, APRN - NP   albuterol sulfate HFA (PROVENTIL HFA) 108 (90 Base) MCG/ACT inhaler Inhale 2 puffs into the lungs every 6 hours as  needed for Wheezing Yes Almira Coaster, APRN - CNP   lidocaine (LIDODERM) 5 % Place 1 patch onto the skin daily 12 hours on, 12 hours off.  Patient taking differently: Place 1 patch onto the skin as needed 12 hours on, 12 hours off. Yes Sharmaine Base Wanninger, PA-C   aspirin 81 MG EC tablet Take 1 tablet by mouth daily Yes Harvel Quale, MD   Multiple Vitamins-Minerals (MULTIVITAMIN PO) Take 1 tablet by mouth daily  Yes Historical Provider, MD   triamcinolone (KENALOG) 0.1 % cream Apply to affected area twice daily for up to 2 weeks or until improved.  Patient not taking: Reported on 02/18/2022   Francisca December, MD   calcium citrate-vitamin D (CITRICAL + D) [phone] MG-UNIT TABS per tablet Take 2 tablets by mouth 2 times daily  Patient not taking: Reported on 02/18/2022  Historical Provider, MD   acetic acid-hydrocortisone (VOSOL-HC) 1-2 % otic solution 4 drops in affected ear or ears every 3 hours while awake  Patient not taking: Reported on 02/18/2022  Alvina Chou, MD        Allergies   Allergen Reactions    Ibuprofen     Seasonal     Talwin [Pentazocine] Other (See Comments)     dizzy       Past Medical History:   Diagnosis Date    AKI (acute kidney injury) ([place]) 01/26/2022    Anxiety     Asthma     Blood circulation, collateral     CAD (coronary artery disease)     CHF (congestive heart failure) (HCC)     Chronic back pain     Deep vein thrombosis     Degeneration of thoracic intervertebral disc 10/15/2017    Depression     GERD (gastroesophageal reflux disease)     NO LONGER SINCE LAP    GERD (gastroesophageal reflux disease) 01/23/2009    Hx of blood clots     Hyperlipidemia     hx; resolved with lap band    Hypertension     Idiopathic peripheral neuropathy 03/26/2017    Melanoma ([place])     upper back    MRSA (methicillin resistant staph aureus) culture positive 10/17/2019    leg abscess    Obesity     hx of; had lap band    Obstructive sleep apnea 09/18/2015    uses bi-pap    Obstructive sleep apnea (adult) (pediatric) 09/18/2015    Pulmonary embolism (HCC)     Stenosis of right carotid artery 06/16/2021    Type 2 diabetes mellitus with diabetic polyneuropathy, without long-term current use of insulin ([place]) 01/26/2022       Past Surgical History:   Procedure Laterality Date    BACK SURGERY      neck  plates    BREAST SURGERY      left lumpectomy    CERVICAL FUSION      CESAREAN SECTION      CHOLECYSTECTOMY      COLONOSCOPY      COLONOSCOPY  04/08/2010    COLONOSCOPY N/A 04/18/2021    COLONOSCOPY DIAGNOSTIC performed by Norwood Levo, MD at [facility] CATH LAB PROCEDURE      DILATATION, ESOPHAGUS      ENDOSCOPY, COLON, DIAGNOSTIC      HYSTERECTOMY (CERVIX STATUS UNKNOWN)      HYSTERECTOMY, VAGINAL  LAP BAND  05/01/2008    Dr. Nicole Cella    OTHER SURGICAL HISTORY      [place] Filter: curently in place    OTHER SURGICAL HISTORY  07/05/2013    lap band removal    SHOULDER SURGERY      SKIN CANCER EXCISION  12/29/2017    UPPER GASTROINTESTINAL ENDOSCOPY  05/19/2013    UPPER GASTROINTESTINAL ENDOSCOPY  07/21/2013    ESOPHAGEAL STENT PLACEMENT    UPPER GASTROINTESTINAL ENDOSCOPY N/A 07/31/2014    Esophagogastroduodenoscopy with esophageal balloon dilation    UPPER GASTROINTESTINAL ENDOSCOPY N/A 04/18/2021    EGD DILATION BALLOON performed by Norwood Levo, MD at [facility]  04/2015    Lucendia Herrlich, celiac artery angiogram, normal abd arteries       Social History     Tobacco Use    Smoking status: Former     Packs/day: 0.50     Years: 40.00     Pack years: 20.00     Types: Cigarettes     Quit date: 08/07/2012     Years since quitting: 9.5    Smokeless tobacco: Former     Quit date: 06/16/2013    Tobacco comments:     started to smoke at age 48 / only smoked 0.5 p.p.d    Substance Use Topics    Alcohol use: No     Alcohol/week: 0.0 standard drinks        Family History   Problem Relation Age of Onset    Arthritis Mother     Cancer Mother     Depression Mother     High Blood Pressure Mother     Heart Disease Mother 82        MI     Ovarian Cancer Mother 11    Cancer Father 29        colon    Heart Disease Maternal Grandmother     Early Death Paternal Grandmother     Heart Disease Paternal Grandfather     Diabetes Other     High Blood Pressure Other     Obesity Other     Other Sister         OSA    Other Brother         OSA    Other Brother         OSA    Other Daughter         OSA       PHYSICAL EXAMINATION:  Vitals:    02/18/22 1529   BP: 124/70   Site: Right Upper Arm   Position: Sitting   Cuff Size: Medium Adult   Pulse: 62   SpO2: 97%    Weight: 297 lb (134.7 kg)   Height: '5\' 5"'$  (1.651 m)       Estimated body mass index is 49.42 kg/m?? as calculated from the following:    Height as of this encounter: '5\' 5"'$  (1.651 m).    Weight as of this encounter: 297 lb (134.7 kg).    Physical Exam  Constitutional:       Appearance: Rachael Mays is well-developed. Rachael Mays is not diaphoretic.   HENT:      Head: Normocephalic and atraumatic.   Eyes:      General: No scleral icterus.     Extraocular Movements: Extraocular movements intact.      Conjunctiva/sclera: Conjunctivae normal.   Neck:      Vascular: No JVD.  Cardiovascular:      Rate and Rhythm: Normal rate and regular rhythm.      Heart sounds: Murmur heard.     No friction rub. No gallop.      Comments: 2/6 RUSB  Pulmonary:      Effort: Pulmonary effort is normal. No respiratory distress.      Breath sounds: Normal breath sounds. No wheezing or rales.   Abdominal:      General: Bowel sounds are normal.      Palpations: Abdomen is soft.      Tenderness: There is no abdominal tenderness.   Musculoskeletal:         General: Normal range of motion.      Cervical back: Normal range of motion and neck supple.      Right lower leg: Edema present.      Left lower leg: Edema present.      Comments: +1 bilaterally     Skin:     General: Skin is warm and dry.      Findings: No rash.   Neurological:      General: No focal deficit present.      Mental Status: Rachael Mays is alert and oriented to person, place, and time.         I have reviewed all pertinent lab results and diagnostic testing.        LABS:  CBC:   Lab Results   Component Value Date/Time    WBC 8.3 10/14/2021 08:40 AM    RBC 4.16 10/14/2021 08:40 AM    HGB 12.3 10/14/2021 08:40 AM    HCT 37.1 10/14/2021 08:40 AM    MCV 89.2 10/14/2021 08:40 AM    RDW 14.0 10/14/2021 08:40 AM    PLT 218 10/14/2021 08:40 AM     CMP:   Lab Results   Component Value Date/Time    NA 142 10/14/2021 08:40 AM    K 4.4 10/14/2021 08:40 AM    K 4.6 10/17/2019 06:07 AM    CL 107 10/14/2021 08:40 AM     CO2 21 10/14/2021 08:40 AM    BUN 25 10/14/2021 08:40 AM    CREATININE 1.3 10/14/2021 08:40 AM    GFRAA >60 10/18/2019 05:42 AM    GFRAA >60 03/16/2012 03:42 PM    AGRATIO 0.9 10/16/2019 04:55 PM    LABGLOM 46 10/14/2021 08:40 AM    GLUCOSE 123 10/14/2021 08:40 AM    PROT 7.5 02/13/2021 03:02 PM    PROT 7.5 02/13/2021 03:02 PM    PROT 7.3 03/31/2011 03:07 PM    CALCIUM 10.1 10/14/2021 08:40 AM    BILITOT 0.7 02/13/2021 03:02 PM    BILITOT 0.7 02/13/2021 03:02 PM    ALKPHOS 71 02/13/2021 03:02 PM    ALKPHOS 71 02/13/2021 03:02 PM    AST 53 02/13/2021 03:02 PM    AST 53 02/13/2021 03:02 PM    ALT 47 02/13/2021 03:02 PM    ALT 47 02/13/2021 03:02 PM     Lab Results   Component Value Date/Time    CHOL 149 02/13/2021 03:02 PM    TRIG 116 02/13/2021 03:02 PM    HDL 48 02/13/2021 03:02 PM    HDL 52 03/31/2011 03:07 PM    LDLCALC 78 02/13/2021 03:02 PM       Rachael Mays/INR: No results found for: PTINR     Cardiac cath 10/14/21 [facility])  Anatomy:   LM-normal  LAD-patent proximal stent with 10% in-stent restenosis, 30% mid with  mild myocardial bridging  Cx-10% to 20% proximal  OM1-normal  RCA-normal  LVEF-70%     Impression:  1.  Mild nonobstructive CAD.  2.  Hyperdynamic LV systolic function.  3.  High resting heart rate.     Plan:  1.  Recommend addition of diltiazem CD 180 mg daily.  To accommodate addition of diltiazem reduce Ranexa to 500 mg twice daily.  Also would decrease lisinopril to 20 mg daily to allow for enough blood pressure to accommodate addition of diltiazem.  Diltiazem may be helpful should there be a component of microvascular angina.    Cardiac cath 02/18/21 ([facility])   Angiographic Findings:  Right dominant system   Left Main:  Normal     Left Anterior Descending:  Patent proximal stent. Mid 30% stenosis,   irregular appearing in some views. FFR 0.89.   Circumflex:  No obstructive disease   Right Coronary:  No obstructive disease   Left Ventriculogram:  LVEF 55-60%     Hemodynamics (mm Hg):   Left Ventricular  Pressure:  102/49   Central Aortic Pressure:  125/0, 6     Conclusions:   -No obstructive CAD   Recommendations:   -Follow-up with GI   -Continue medical therapy     Lexiscan myoview 01/29/21 ([facility])  Interpetation Summary:   1. No no change of baseline chest pain after regadenoson injection.   2. No evidence of stress-induced ischemia on EKG.   3. There is evidence of ischemia in the territory subtended by the left anterior descending coronary artery.   4. Normal LV systolic function.        Carotid duplex 11/12/20: [facility])      o 50-69% stenosis of the right internal carotid artery based on velocity        criteria.      o No hemodynamically significant lesions are identified on the left        internal or common carotid(s).      o There is antegrade flow demonstrated in the right vertebral artery.      o The left vertebral artery could not be visualized.     Echocardiogram 05/27/20: ([facility])     The left ventricle is normal in size.   There is normal left ventricular wall thickness.   Left ventricular systolic function is normal. (LVEF>/=55%)   Overall left ventricular ejection fraction is estimated to be 60-65%.   Normal Diastolic Function.   No significant valvular abnormalities identified.         Procedure:          LHC 08/09/19  Indication:          Unstable angina     Procedure Details:  Consent Access Bleed R Sedat Start Stop Versed Fentanyl Contrast Fluoro EBL Comp Spec   Yes RRA low MCSFC 1457 1522 4 150 51 1.9 <20 None None   *Ultrasound Note: Ultrasound guidance used to determine aforementioned artery patency, size (>46m), anatomic variations and ideal puncture location.  Real-time ultrasound utilized concurrent with vascular needle entry into the artery.  Image(s) permanently recorded and reported in the patient chart.  *Sedation Note: MCSFC = minimal conscious sedation for comfort     Findings:  Artery Findings/Result   LM Normal   LAD Widely patent stent, 50% ostial D1 jailed by stent  (no change)   Cx Normal   RI NA   RCA Normal   LVEDP 6   LVG 60%      Intervention:  None     Post Cath Dx:       Nonobstructive CAD, patent stent LAD    Cardiac Cath 05/07/17:  Anatomy:   LM-normal  LAD-normal, widely patent stent mid  Cx-normal  OM1- normal  RCA-normal, right dominant  RPDA- normal  LVEF- 60%     Impression:  1.  Normal coronary artery anatomy.  2.  Widely patent mid LAD stent.  3.  Normal LV systolic function.  4.  False positive stress test.  Likely breast artifact.     Plan:  1.  Medical management.  2.  Rachael Mays needs workup of noncardiac causes of chest pain.  Rachael Mays now has to stress test with similar abnormalities most consistent with breast artifact.  The majority of her coronary angiograms have demonstrated normal anatomy except for the one that had a 70% mid LAD stenosis which was stented.  Her chest pain has atypical features.  Recommend rheumatology and possibly orthopedic evaluation.    Echo 08/05/16 ([place], [place])  LV with normal systolic function,  EF 00-93%  Mild MR with multiple jets  Very mild AS  Mild TR    Cardiac Cath 07/17/16:  Anatomy:   LM-normal  LAD-widely patent stent otherwise normal  Cx-normal, dominant  OM1- normal  RCA-normal  LVEF- 70%, LVEDP 15 mmHg     Impression:  1.  Widely patent LAD stent otherwise normal coronary arteries  2.  Hyperdynamic LV systolic function  3.  Non-cardiac chest pain     Plan:  1.  Medical management    Cardiac Cath 03/31/16:  Anatomy:   LM-normal   LAD-70% mid with FFR 0.77  Cx-normal, codominant  OM1- normal  RCA-normal  RPDA- normal  LVEF- 70%, LVEDP 19   PCI: LAD 70% to 0% with 3.25 mm x 18 mm Xience Alpine drug-eluting stent     Hemodynamics:  RA-15/12 mean 9  RV- 60/0, 9  PAWP-20/26 mean 19  PA- 60/12 mean 33      Impression:  1.  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  2.  Hyperdynamic LV systolic function.  3.  Mildly decompensated diastolic CHF.  4.  Moderate pulmonary  hypertension.     Cardiac cath 09/23/15: normal coronary arteries      ASSESSMENT/PLAN:  1. Coronary artery disease due to lipid rich plaque  - 03/31/16 LHC - mid LAD 70% stenosis with FFR 0.77.  S/p DES x 1. Remainder of arteries normal  - 07/17/16 LHC - patent stent, otherwise normal coronary arteries  - 05/07/17 LHC - patent stent, otherwise normal coronary arteries (false positive nuclear stress test  - 08/09/19 LHC - widely patent LAD stent, 50% ostial D1 jailed by stent (no change)  - 01/29/21 SPECT - ischemia in LAD territory  - 02/18/21 LHC - right dominant system.  Patent proximal LAD stent, mid 30% stenosis, irregular appearing in some views. FFR 0.89.  Medical management.  Started on Ranexa  - 10/14/21 LHC - mild nonobstructive CAD, microvascular cad, cont medical management, add diltiazem  -  Ranexa, ASA, statin, diltiazem     Plan:  Stable.  Continue current medical regimen. May consider holding diltiazem if edema does not improve.     2. Chronic diastolic congestive heart failure ([place])  3. Pulmonary hypertension  - 03/2016 Right heart cath - mildly decompensated heart failure with moderate pulmonary hypertension (PAWP-20/26 mean 19 ; PA- 60/12 mean 33)   - 04/06/16 Echo - EF 65%, mild  CLVH, RVSP 42 mmHg  - 05/27/20 Echo - EF 23-55%, normal diastolic function, function is normal.   - lisinopril 20 mg and spiro 25 mg stopped by Dr Ashley Royalty d/t renal function  - Cr 2.01 01/16/22  -  weight is up per Rachael Mays and Rachael Mays is sob with 1+ edema bilat LE    Plan :  check BMP today, will decide with Dr Ashley Royalty tomorrow on a dose for spiro.    4. Essential hypertension  BP 124/70 (Site: Right Upper Arm, Position: Sitting, Cuff Size: Medium Adult)    Pulse 62    Ht '5\' 5"'$  (1.651 m)    Wt 297 lb (134.7 kg)    SpO2 97%    BMI 49.42 kg/m??      Plan : Stable.  Continue current medical regimen.    5. Dyslipidemia  - Rosuvastatin 20 mg   - 02/17/21 - TC- 141, TG- 127, HDL- 47, LDL- 69.  AST- 47, ALT- 47  - 07/09/21 TC 150 TG 142 HDL 46 LDL 79. LFT  normal.    - crestor 20 mg , CoQ 10     Plan :  repeat fasting Lipid/LFTs soon.    6. Stenosis of right carotid artery  - 11/12/20 carotid duplex ([facility]) - RICA with 50-69% stenosis of RICA, no significant plaque in Rt common carotid artery.    No hemodynamically significant stenosis of left ICA or common carotid artery.   - 11/2021  <50 % bilaterally     Plan : Carotid duplex next year     Plan :  Check labs today (bmp) check fasting lipid / lft soon  Tomorrow will discuss with Dr Ashley Royalty on a dose for Spironolactone and let Rachael Mays know   RTC 3 months       Return in about 3 months (around 05/21/2022).    An  electronic signature was used to authenticate this note.    Stephannie Li. [place], MD, [facility], [facility]    Scribe's Attestation: This note was scribed in the presence of Dr. Darlyn Chamber, MD by Tonna Corner, RN.     Physician Attestation: The scribe's documentation has been prepared under my direction and personally reviewed by me in its entirety.  I confirm that the note above accurately reflects all work, treatment, procedures, and medical decision making performed by me.

## 2022-02-19 LAB — BASIC METABOLIC PANEL
Anion Gap: 11 (ref 3–16)
BUN: 17 mg/dL (ref 7–20)
CO2: 25 mmol/L (ref 21–32)
Calcium: 9.6 mg/dL (ref 8.3–10.6)
Chloride: 107 mmol/L (ref 99–110)
Creatinine: 1.3 mg/dL — ABNORMAL HIGH (ref 0.6–1.2)
Est, Glom Filt Rate: 46 — AB (ref >60)
Glucose: 101 mg/dL — ABNORMAL HIGH (ref 70–99)
Potassium: 4.1 mmol/L (ref 3.5–5.1)
Sodium: 143 mmol/L (ref 136–145)

## 2022-02-19 LAB — HEPATIC FUNCTION PANEL
ALT: 15 U/L (ref 10–40)
AST: 24 U/L (ref 15–37)
Albumin: 4 g/dL (ref 3.4–5.0)
Alkaline Phosphatase: 97 U/L (ref 40–129)
Bilirubin, Direct: <0.2 mg/dL (ref 0.0–0.3)
Total Bilirubin: 0.4 mg/dL (ref 0.0–1.0)
Total Protein: 6.8 g/dL (ref 6.4–8.2)

## 2022-02-19 LAB — LIPID, FASTING
Cholesterol, Fasting: 154 mg/dL (ref 0–199)
HDL: 57 mg/dL (ref 40–60)
LDL Calculated: 77 mg/dL (ref <100)
Triglyceride, Fasting: 100 mg/dL (ref 0–150)
VLDL Cholesterol Calculated: 20 mg/dL

## 2022-02-19 NOTE — Telephone Encounter (Signed)
Lvm with patient letting her know that her Cr was 1.3 and per [facility] she can take her spironolactone 25 mg daily until she sees Dr Ashley Royalty. Call back number left for questions.

## 2022-02-22 ENCOUNTER — Encounter

## 2022-02-23 ENCOUNTER — Ambulatory Visit
Admit: 2022-02-23 | Discharge: 2022-02-23 | Payer: PRIVATE HEALTH INSURANCE | Attending: Nephrology | Primary: Sports Medicine

## 2022-02-23 DIAGNOSIS — N1831 Chronic kidney disease, stage 3a: Secondary | ICD-10-CM

## 2022-02-23 MED ORDER — TORSEMIDE 10 MG PO TABS
10 MG | ORAL_TABLET | Freq: Every day | ORAL | 0 refills | Status: DC
Start: 2022-02-23 — End: 2022-03-17

## 2022-02-23 MED ORDER — TORSEMIDE 10 MG PO TABS
10 MG | ORAL_TABLET | Freq: Every day | ORAL | 1 refills | Status: DC
Start: 2022-02-23 — End: 2022-02-23

## 2022-02-23 NOTE — Progress Notes (Signed)
Office : [phone]     Fax :[phone]      Nephrology progress Note        History of Present Illness:    This is a 66 y.o. female who presents to the office for evaluation of  worsening renal function .  H/o DM 2 - none   H/o HTN - now BP better after loss of weight   H/o Renal stones - none   H/o renal disease in family   H/o CAD - h/o Stent in past. H/o CHF     H/o smoking .    Recently stopped lisinopril and spironolactone          Today visit     Has gained 10 lbs in last 1 week   Resumed spironolactone last week.     Making good urine     Past Medical History:        Diagnosis Date    AKI (acute kidney injury) ([facility]) 01/26/2022    Anxiety     Asthma     Blood circulation, collateral     CAD (coronary artery disease)     CHF (congestive heart failure) (HCC)     Chronic back pain     Deep vein thrombosis     Degeneration of thoracic intervertebral disc 10/15/2017    Depression     GERD (gastroesophageal reflux disease)     NO LONGER SINCE LAP    GERD (gastroesophageal reflux disease) 01/23/2009    Hx of blood clots     Hyperlipidemia     hx; resolved with lap band    Hypertension     Idiopathic peripheral neuropathy 03/26/2017    Melanoma ([place])     upper back    MRSA (methicillin resistant staph aureus) culture positive 10/17/2019    leg abscess    Obesity     hx of; had lap band    Obstructive sleep apnea 09/18/2015    uses bi-pap    Obstructive sleep apnea (adult) (pediatric) 09/18/2015    Pulmonary embolism (HCC)     Stenosis of right carotid artery 06/16/2021    Type 2 diabetes mellitus with diabetic polyneuropathy, without long-term current use of insulin ([facility]) 01/26/2022       Past Surgical History:        Procedure Laterality Date    BACK SURGERY      neck  plates    BREAST SURGERY      left lumpectomy    CERVICAL FUSION      CESAREAN SECTION      CHOLECYSTECTOMY      COLONOSCOPY      COLONOSCOPY  04/08/2010    COLONOSCOPY N/A 04/18/2021    COLONOSCOPY DIAGNOSTIC  performed by Norwood Levo, MD at [place] CATH LAB PROCEDURE      DILATATION, ESOPHAGUS      ENDOSCOPY, COLON, DIAGNOSTIC      HYSTERECTOMY (CERVIX STATUS UNKNOWN)      HYSTERECTOMY, VAGINAL      LAP BAND  05/01/2008    Dr. Nicole Cella    OTHER SURGICAL HISTORY      [place] Filter: curently in place    OTHER SURGICAL HISTORY  07/05/2013    lap band removal    SHOULDER SURGERY      SKIN CANCER EXCISION  12/29/2017    UPPER GASTROINTESTINAL ENDOSCOPY  05/19/2013    UPPER GASTROINTESTINAL ENDOSCOPY  07/21/2013    ESOPHAGEAL STENT PLACEMENT    UPPER GASTROINTESTINAL ENDOSCOPY N/A 07/31/2014    Esophagogastroduodenoscopy with esophageal balloon dilation    UPPER GASTROINTESTINAL ENDOSCOPY N/A 04/18/2021    EGD DILATION BALLOON performed by Norwood Levo, MD at [facility]  04/2015    Lucendia Herrlich, celiac artery angiogram, normal abd arteries       Current Medications:    Current Outpatient Medications   Medication Sig Dispense Refill    dilTIAZem (CARDIZEM CD) 180 MG extended release capsule TAKE ONE CAPSULE BY MOUTH DAILY 90 capsule 3    omeprazole (PRILOSEC) 40 MG delayed release capsule TAKE ONE CAPSULE BY MOUTH DAILY 30 capsule 0    Tirzepatide (MOUNJARO) 5 MG/0.5ML SOPN SC injection once a week      triamcinolone (KENALOG) 0.1 % cream Apply to affected area twice daily for up to 2 weeks or until improved. 15 g 2    topiramate (TOPAMAX) 25 MG tablet Take 50 mg by mouth 2 times daily      montelukast (SINGULAIR) 10 MG tablet TAKE ONE TABLET BY MOUTH DAILY. **MUST CALL MD FOR APPOINTMENT 30 tablet 5    fluocinonide (LIDEX) 0.05 % external solution APPLY SPARINGLY TO THE BACK OF THE SCALP DAILY AS NEEDED FOR ITCHING 60 mL 1    oxyCODONE-acetaminophen (PERCOCET) 7.5-325 MG per tablet as needed.      rosuvastatin (CRESTOR) 20 MG tablet daily      magnesium (MAGNESIUM-OXIDE) 250 MG TABS tablet Take 350 mg by mouth daily      Coenzyme Q10  (CO Q10) 200 MG CAPS Take by mouth daily (with breakfast)      nitroGLYCERIN (NITROSTAT) 0.4 MG SL tablet up to max of 3 total doses. If no relief after 1 dose, call 911. 25 tablet 3    ranolazine (RANEXA) 500 MG extended release tablet Take 1 tablet by mouth 2 times daily 180 tablet 3    clotrimazole-betamethasone (LOTRISONE) 1-0.05 % cream Apply topically 2 times daily. 45 g 0    triamcinolone (KENALOG) 0.1 % cream APPLY TO AFFECTED AREA(S) TWO TIMES A DAY FOR UP TO 2 WEEKS OR UNTIL IMPROVED (Patient taking differently: as needed) 80 g 0    diclofenac sodium (VOLTAREN) 1 % GEL APPLY 4 GRAMS TO AFFECTED AREA(S) TWO TIMES A DAY 100 g 1    Cholecalciferol (VITAMIN D3) 50 MCG (2000 UT) CAPS Take 1 capsule by mouth daily 90 capsule 3    albuterol sulfate HFA (PROVENTIL HFA) 108 (90 Base) MCG/ACT inhaler Inhale 2 puffs into the lungs every 6 hours as needed for Wheezing 3 Inhaler 3    lidocaine (LIDODERM) 5 % Place 1 patch onto the skin daily 12 hours on, 12 hours off. (Patient taking differently: Place 1 patch onto the skin as needed 12 hours on, 12 hours off.) 30 patch 0    aspirin 81 MG EC tablet Take 1 tablet by mouth daily 30 tablet 11    Multiple Vitamins-Minerals (MULTIVITAMIN PO) Take 1 tablet by mouth daily       calcium citrate-vitamin D (CITRICAL + D) 315-250 MG-UNIT TABS per tablet Take 2 tablets by mouth 2 times daily (Patient not taking: No sig reported)      acetic acid-hydrocortisone (VOSOL-HC) 1-2 % otic solution 4 drops in affected ear or ears every 3 hours while awake (Patient not taking: No sig reported) 15 mL 1     No current facility-administered medications for this visit.  Allergies:  Ibuprofen, Seasonal, and Talwin [pentazocine]    Social History:   Social History     Socioeconomic History    Marital status: Divorced     Spouse name: [name]    Number of children: 2    Years of education: 12    Highest education level: Not on file   Occupational History    Not on file   Tobacco Use    Smoking  status: Former     Packs/day: 0.50     Years: 40.00     Pack years: 20.00     Types: Cigarettes     Quit date: 08/07/2012     Years since quitting: 9.5    Smokeless tobacco: Former     Quit date: 06/16/2013    Tobacco comments:     started to smoke at age 51 / only smoked 0.5 p.p.d    Vaping Use    Vaping Use: Never used   Substance and Sexual Activity    Alcohol use: No     Alcohol/week: 0.0 standard drinks    Drug use: No    Sexual activity: Not Currently   Other Topics Concern    Not on file   Social History Narrative    Works as Scientist, water quality at [employer] 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 on Training and development officer Insecurity: Not on file   Transportation Needs: Not on file   Physical Activity: Not on file   Stress: Not on file   Social Connections: Not on file   Intimate Partner Violence: Not on file   Housing Stability: Not on file       Family History:   Family History   Problem Relation Age of Onset    Arthritis Mother     Cancer Mother     Depression Mother     High Blood Pressure Mother     Heart Disease Mother 48        MI     Ovarian Cancer Mother 38    Cancer Father 28        colon    Heart Disease Maternal Grandmother     Early Death Paternal Grandmother     Heart Disease Paternal Grandfather     Diabetes Other     High Blood Pressure Other     Obesity Other     Other Sister         OSA    Other Brother         OSA    Other Brother         OSA    Other Daughter         OSA       Review of Systems:    Constitutional: No fever, no chills, no lethargy, no weakness.  HEENT:  No headache, otalgia, itchy eyes, nasal discharge or sore throat.  Cardiac:  No chest pain, dyspnea, orthopnea or PND.  Chest:  No cough, phlegm or wheezing.  Abdomen:  No abdominal pain, nausea or vomiting.  Neuro:  No focal weakness, abnormal movements orseizure like activity.  Skin:   No rashes, no itching.  GU:   No hematuria, no pyuria, no dysuria, no flank pain.  Extremities:  No swelling or joint  pains.      Objective:  BP (!) 129/57    Pulse 59    Wt 294 lb (133.4 kg)  BMI 48.92 kg/m??     Physical Exam:  General appearance:Awake, alert, in no acute distress  Skin: warm and dry, no rash or erythema    Eyes: conjunctivae normal and sclera anicteric    ENT: :no thrush no pharyngeal congestion      Neck: without any JVD. No carotid bruits or thyromegaly.      Pulmonary: lungs are clear and without any wheezing or rhonchi     Cardiovascular:Normal S1 & S2, No S3 or  S4, No  Pericardial rub , No Murmur     Abdomen: soft nontender, bowel sounds present, no organomegaly,  No ascites      Extremities: no cyanosis, clubbing or edema    Labs:    CBC:   Lab Results   Component Value Date/Time    WBC 8.3 10/14/2021 08:40 AM    RBC 4.16 10/14/2021 08:40 AM    HGB 12.3 10/14/2021 08:40 AM    HCT 37.1 10/14/2021 08:40 AM    MCV 89.2 10/14/2021 08:40 AM    RDW 14.0 10/14/2021 08:40 AM    PLT 218 10/14/2021 08:40 AM    MPV 8.4 10/14/2021 08:40 AM      BMP:   Lab Results   Component Value Date/Time    NA 143 02/18/2022 04:20 PM    NA 142 10/14/2021 08:40 AM    NA 136 02/13/2021 03:02 PM    K 4.1 02/18/2022 04:20 PM    K 4.4 10/14/2021 08:40 AM    K 4.6 02/13/2021 03:02 PM    K 4.6 10/17/2019 06:07 AM    K 3.9 05/05/2017 03:21 AM    CL 107 02/18/2022 04:20 PM    CL 107 10/14/2021 08:40 AM    CL 102 02/13/2021 03:02 PM    CO2 25 02/18/2022 04:20 PM    CO2 21 10/14/2021 08:40 AM    CO2 27 02/13/2021 03:02 PM    BUN 17 02/18/2022 04:20 PM    BUN 25 10/14/2021 08:40 AM    BUN 20 02/13/2021 03:02 PM    CREATININE 1.3 02/18/2022 04:20 PM    CREATININE 1.3 10/14/2021 08:40 AM    CREATININE 1.08 02/13/2021 03:02 PM    GLUCOSE 101 02/18/2022 04:20 PM    GLUCOSE 123 10/14/2021 08:40 AM    GLUCOSE 119 02/13/2021 03:02 PM    CALCIUM 9.6 02/18/2022 04:20 PM    CALCIUM 10.1 10/14/2021 08:40 AM    CALCIUM 10.0 02/13/2021 03:02 PM      BNP:No results found for: BNP  PHOSPHORUS:    Lab Results   Component Value Date/Time    PHOS 3.8  08/21/2013 03:46 PM    PHOS 3.3 08/18/2013 08:13 AM    PHOS 3.5 08/17/2013 06:49 AM     MAGNESIUM:   Lab Results   Component Value Date/Time    MG 2.20 05/07/2017 04:45 AM     ALBUMIN:   Lab Results   Component Value Date/Time    LABALBU 4.0 02/18/2022 04:30 PM     IRON:    Lab Results   Component Value Date/Time    IRON 93 03/26/2010 05:35 PM     IRON SATURATION:    Lab Results   Component Value Date/Time    LABIRON 24 03/26/2010 05:35 PM     TIBC:    Lab Results   Component Value Date/Time    TIBC 397 03/26/2010 05:35 PM     FERRITIN:    Lab Results   Component Value Date/Time  FERRITIN 61.9 05/02/2009 05:40 PM     ANA:   Lab Results   Component Value Date    ANA Negative 07/26/2019       SPEP:   Lab Results   Component Value Date/Time    PROT 6.8 02/18/2022 04:30 PM    PROT 7.3 03/31/2011 03:07 PM    LABALPH 0.2 03/29/2017 03:33 PM    LABALPH 0.7 03/29/2017 03:33 PM    LABBETA 1.2 03/29/2017 03:33 PM    GAMGLOB 1.2 03/29/2017 03:33 PM     UPEP: No results found for: TPU   HEPBSAG:No results found for: HEPBSAG  HEPCAB:No results found for: HEPCAB  C3: No results found for: C3  C4: No results found for: C4  MPO ANCA: No results found for: MPO .  PR3 ANCA:  No results found for: PR3  PTH: No results found for: PTH    Urine Creatinine:    Lab Results   Component Value Date/Time    LABCREA 241.1 02/13/2021 03:04 PM     Urine Eosinophils: No results found for: UREO  Urine Protein:  No results found for: TPU  Urinalysis:  U/A:   Lab Results   Component Value Date/Time    NITRU NEGATIVE 02/13/2021 03:03 PM    COLORU Yellow 02/13/2021 03:03 PM    PHUR 5.5 11/25/2017 04:24 PM    LABCAST 0-1 Hyaline 07/09/2013 02:09 AM    WBCUA 4 07/16/2014 07:26 PM    RBCUA NEGATIVE 02/13/2021 03:03 PM    MUCUS PRESENT 02/13/2021 03:03 PM    YEAST 0 11/25/2017 04:24 PM    BACTERIA 0 11/25/2017 04:24 PM    BACTERIA 2+ 07/16/2014 07:26 PM    CLARITYU Clear 02/13/2021 03:03 PM    SPECGRAV 1.025 02/13/2021 03:03 PM    LEUKOCYTESUR NEGATIVE  02/13/2021 03:03 PM    LEUKOCYTESUR 0 to 5 02/13/2021 03:03 PM    UROBILINOGEN 2+ (4-6 mg/dL) 02/13/2021 03:03 PM    BILIRUBINUR NEGATIVE 02/13/2021 03:03 PM    BLOODU Negative 11/25/2017 04:24 PM    GLUCOSEU NEGATIVE 02/13/2021 03:03 PM    KETUA NEGATIVE 02/13/2021 03:03 PM         Radiology:  Reviewed as available.    Assessment/Plan:    CKD stage 3 A   Creat 1.4   Hold lisinopril since BP is in normal  range       2. H/o CHF but EF improved   Resumed  spironolactone   Has increased edema   Will give torsemide 10 mg po daily for 15 days     Rpt  kidney function again in 12 weeks                 Discussed with patient in detail.   Counseling done for diet and lifestyle changes.  Explained importance of compliance with medications               Thank you for the consultation.        Dorathy Daft, MD  Nephrology     [facility] Office : [address], [address] , [place] [address]  [facility] Office: [address], [place] [address]  [facility] Office: [address] [place] [address].  [facility] Office: [address] [place] [address]  Office : [phone]  Fax :[phone]

## 2022-02-27 ENCOUNTER — Inpatient Hospital Stay: Payer: PRIVATE HEALTH INSURANCE | Primary: Sports Medicine

## 2022-02-27 ENCOUNTER — Telehealth

## 2022-02-27 DIAGNOSIS — R109 Unspecified abdominal pain: Secondary | ICD-10-CM

## 2022-02-27 NOTE — Telephone Encounter (Signed)
said Dr Ashley Royalty put her on a water pill. Since she started taking it, she has been having "side cramps" I adivsed that she call her PCP if she thinks she has a UTI or something like that. She said she is urinating normal. She takes percocet and states that it does not touch the pain. Please advise. Per Dr Ashley Royalty, she will need a BMP to check kidney function. Check BMP. If she has fever or the pain persists then she needs to got to ER. Pt informed, voiced understanding, thanked MD.

## 2022-02-28 LAB — BASIC METABOLIC PANEL
Anion Gap: 13 (ref 3–16)
BUN: 20 mg/dL (ref 7–20)
CO2: 24 mmol/L (ref 21–32)
Calcium: 9.9 mg/dL (ref 8.3–10.6)
Chloride: 108 mmol/L (ref 99–110)
Creatinine: 1.4 mg/dL — ABNORMAL HIGH (ref 0.6–1.2)
Est, Glom Filt Rate: 42 — AB (ref >60)
Glucose: 123 mg/dL — ABNORMAL HIGH (ref 70–99)
Potassium: 4.5 mmol/L (ref 3.5–5.1)
Sodium: 145 mmol/L (ref 136–145)

## 2022-03-07 ENCOUNTER — Encounter

## 2022-03-09 NOTE — Telephone Encounter (Signed)
DENIED, Pt due for 30 min physical

## 2022-03-12 ENCOUNTER — Inpatient Hospital Stay: Payer: PRIVATE HEALTH INSURANCE | Primary: Sports Medicine

## 2022-03-12 DIAGNOSIS — N1831 Chronic kidney disease, stage 3a: Secondary | ICD-10-CM

## 2022-03-12 LAB — BASIC METABOLIC PANEL
Anion Gap: 15 (ref 3–16)
BUN: 18 mg/dL (ref 7–20)
CO2: 21 mmol/L (ref 21–32)
Calcium: 9.5 mg/dL (ref 8.3–10.6)
Chloride: 107 mmol/L (ref 99–110)
Creatinine: 1.4 mg/dL — ABNORMAL HIGH (ref 0.6–1.2)
Est, Glom Filt Rate: 42 — AB (ref >60)
Glucose: 129 mg/dL — ABNORMAL HIGH (ref 70–99)
Potassium: 4.7 mmol/L (ref 3.5–5.1)
Sodium: 143 mmol/L (ref 136–145)

## 2022-03-16 ENCOUNTER — Encounter: Payer: PRIVATE HEALTH INSURANCE | Attending: Nephrology | Primary: Sports Medicine

## 2022-03-17 ENCOUNTER — Encounter: Payer: PRIVATE HEALTH INSURANCE | Attending: Nephrology | Primary: Sports Medicine

## 2022-03-17 ENCOUNTER — Ambulatory Visit
Admit: 2022-03-17 | Discharge: 2022-03-17 | Payer: PRIVATE HEALTH INSURANCE | Attending: Nephrology | Primary: Sports Medicine

## 2022-03-17 DIAGNOSIS — N1831 Chronic kidney disease, stage 3a: Secondary | ICD-10-CM

## 2022-03-17 MED ORDER — TORSEMIDE 10 MG PO TABS
10 MG | ORAL_TABLET | Freq: Every day | ORAL | 2 refills | Status: DC
Start: 2022-03-17 — End: 2022-06-06

## 2022-03-17 MED ORDER — TORSEMIDE 10 MG PO TABS
10 MG | ORAL_TABLET | Freq: Every day | ORAL | 2 refills | Status: DC
Start: 2022-03-17 — End: 2022-03-17

## 2022-03-17 NOTE — Progress Notes (Signed)
Office : [phone]     Fax :[phone]      Nephrology progress Note        History of Present Illness:    This is a 66 y.o. female who presents to the office for evaluation of  worsening renal function .  H/o DM 2 - none   H/o HTN - now BP better after loss of weight   H/o Renal stones - none   H/o renal disease in family   H/o CAD - h/o Stent in past. H/o CHF     H/o smoking .    Recently stopped lisinopril and spironolactone          Today visit     Weight under control   Minimal edema   BP controlled       Making good urine     Past Medical History:        Diagnosis Date    AKI (acute kidney injury) ([place]) 01/26/2022    Anxiety     Asthma     Blood circulation, collateral     CAD (coronary artery disease)     CHF (congestive heart failure) (HCC)     Chronic back pain     Deep vein thrombosis     Degeneration of thoracic intervertebral disc 10/15/2017    Depression     GERD (gastroesophageal reflux disease)     NO LONGER SINCE LAP    GERD (gastroesophageal reflux disease) 01/23/2009    Hx of blood clots     Hyperlipidemia     hx; resolved with lap band    Hypertension     Idiopathic peripheral neuropathy 03/26/2017    Melanoma ([place])     upper back    MRSA (methicillin resistant staph aureus) culture positive 10/17/2019    leg abscess    Obesity     hx of; had lap band    Obstructive sleep apnea 09/18/2015    uses bi-pap    Obstructive sleep apnea (adult) (pediatric) 09/18/2015    Pulmonary embolism (HCC)     Stenosis of right carotid artery 06/16/2021    Type 2 diabetes mellitus with diabetic polyneuropathy, without long-term current use of insulin ([place]) 01/26/2022       Past Surgical History:        Procedure Laterality Date    BACK SURGERY      neck  plates    BREAST SURGERY      left lumpectomy    CERVICAL FUSION      CESAREAN SECTION      CHOLECYSTECTOMY      COLONOSCOPY      COLONOSCOPY  04/08/2010    COLONOSCOPY N/A 04/18/2021    COLONOSCOPY DIAGNOSTIC performed by  Norwood Levo, MD at [facility] CATH LAB PROCEDURE      DILATATION, ESOPHAGUS      ENDOSCOPY, COLON, DIAGNOSTIC      HYSTERECTOMY (CERVIX STATUS UNKNOWN)      HYSTERECTOMY, VAGINAL      LAP BAND  05/01/2008    Dr. Nicole Cella    OTHER SURGICAL HISTORY      [place] Filter: curently in place    OTHER SURGICAL HISTORY  07/05/2013    lap band removal    SHOULDER SURGERY      SKIN CANCER EXCISION  12/29/2017    UPPER GASTROINTESTINAL ENDOSCOPY  05/19/2013    UPPER GASTROINTESTINAL ENDOSCOPY  07/21/2013  ESOPHAGEAL STENT PLACEMENT    UPPER GASTROINTESTINAL ENDOSCOPY N/A 07/31/2014    Esophagogastroduodenoscopy with esophageal balloon dilation    UPPER GASTROINTESTINAL ENDOSCOPY N/A 04/18/2021    EGD DILATION BALLOON performed by Norwood Levo, MD at [facility]  04/2015    Lucendia Herrlich, celiac artery angiogram, normal abd arteries       Current Medications:    Current Outpatient Medications   Medication Sig Dispense Refill    torsemide (DEMADEX) 10 MG tablet Take 1 tablet by mouth daily for 15 days 15 tablet 0    dilTIAZem (CARDIZEM CD) 180 MG extended release capsule TAKE ONE CAPSULE BY MOUTH DAILY 90 capsule 3    omeprazole (PRILOSEC) 40 MG delayed release capsule TAKE ONE CAPSULE BY MOUTH DAILY 30 capsule 0    Tirzepatide (MOUNJARO) 5 MG/0.5ML SOPN SC injection once a week      triamcinolone (KENALOG) 0.1 % cream Apply to affected area twice daily for up to 2 weeks or until improved. 15 g 2    topiramate (TOPAMAX) 25 MG tablet Take 50 mg by mouth 2 times daily      montelukast (SINGULAIR) 10 MG tablet TAKE ONE TABLET BY MOUTH DAILY. **MUST CALL MD FOR APPOINTMENT 30 tablet 5    fluocinonide (LIDEX) 0.05 % external solution APPLY SPARINGLY TO THE BACK OF THE SCALP DAILY AS NEEDED FOR ITCHING 60 mL 1    oxyCODONE-acetaminophen (PERCOCET) 7.5-325 MG per tablet as needed.      rosuvastatin (CRESTOR) 20 MG tablet daily      magnesium  (MAGNESIUM-OXIDE) 250 MG TABS tablet Take 350 mg by mouth daily      Coenzyme Q10 (CO Q10) 200 MG CAPS Take by mouth daily (with breakfast)      nitroGLYCERIN (NITROSTAT) 0.4 MG SL tablet up to max of 3 total doses. If no relief after 1 dose, call 911. 25 tablet 3    ranolazine (RANEXA) 500 MG extended release tablet Take 1 tablet by mouth 2 times daily 180 tablet 3    calcium citrate-vitamin D (CITRICAL + D) 315-250 MG-UNIT TABS per tablet Take 2 tablets by mouth 2 times daily (Patient not taking: No sig reported)      clotrimazole-betamethasone (LOTRISONE) 1-0.05 % cream Apply topically 2 times daily. 45 g 0    triamcinolone (KENALOG) 0.1 % cream APPLY TO AFFECTED AREA(S) TWO TIMES A DAY FOR UP TO 2 WEEKS OR UNTIL IMPROVED (Patient taking differently: as needed) 80 g 0    diclofenac sodium (VOLTAREN) 1 % GEL APPLY 4 GRAMS TO AFFECTED AREA(S) TWO TIMES A DAY 100 g 1    Cholecalciferol (VITAMIN D3) 50 MCG (2000 UT) CAPS Take 1 capsule by mouth daily 90 capsule 3    acetic acid-hydrocortisone (VOSOL-HC) 1-2 % otic solution 4 drops in affected ear or ears every 3 hours while awake (Patient not taking: No sig reported) 15 mL 1    albuterol sulfate HFA (PROVENTIL HFA) 108 (90 Base) MCG/ACT inhaler Inhale 2 puffs into the lungs every 6 hours as needed for Wheezing 3 Inhaler 3    lidocaine (LIDODERM) 5 % Place 1 patch onto the skin daily 12 hours on, 12 hours off. (Patient taking differently: Place 1 patch onto the skin as needed 12 hours on, 12 hours off.) 30 patch 0    aspirin 81 MG EC tablet Take 1 tablet by mouth daily 30 tablet 11    Multiple Vitamins-Minerals (MULTIVITAMIN PO) Take 1 tablet by  mouth daily        No current facility-administered medications for this visit.       Allergies:  Ibuprofen, Seasonal, and Talwin [pentazocine]    Social History:   Social History     Socioeconomic History    Marital status: Divorced     Spouse name: [name]    Number of children: 2    Years of education: 12    Highest education  level: Not on file   Occupational History    Not on file   Tobacco Use    Smoking status: Former     Packs/day: 0.50     Years: 40.00     Pack years: 20.00     Types: Cigarettes     Quit date: 08/07/2012     Years since quitting: 9.6    Smokeless tobacco: Former     Quit date: 06/16/2013    Tobacco comments:     started to smoke at age 31 / only smoked 0.5 p.p.d    Vaping Use    Vaping Use: Never used   Substance and Sexual Activity    Alcohol use: No     Alcohol/week: 0.0 standard drinks    Drug use: No    Sexual activity: Not Currently   Other Topics Concern    Not on file   Social History Narrative    Works as Scientist, water quality at [employer] 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 on Training and development officer Insecurity: Not on file   Transportation Needs: Not on file   Physical Activity: Not on file   Stress: Not on file   Social Connections: Not on file   Intimate Partner Violence: Not on file   Housing Stability: Not on file       Family History:   Family History   Problem Relation Age of Onset    Arthritis Mother     Cancer Mother     Depression Mother     High Blood Pressure Mother     Heart Disease Mother 13        MI     Ovarian Cancer Mother 62    Cancer Father 50        colon    Heart Disease Maternal Grandmother     Early Death Paternal Grandmother     Heart Disease Paternal Grandfather     Diabetes Other     High Blood Pressure Other     Obesity Other     Other Sister         OSA    Other Brother         OSA    Other Brother         OSA    Other Daughter         OSA       Review of Systems:    Constitutional: No fever, no chills, no lethargy, no weakness.  HEENT:  No headache, otalgia, itchy eyes, nasal discharge or sore throat.  Cardiac:  No chest pain, dyspnea, orthopnea or PND.  Chest:  No cough, phlegm or wheezing.  Abdomen:  No abdominal pain, nausea or vomiting.  Neuro:  No focal weakness, abnormal movements orseizure like activity.  Skin:   No rashes, no itching.  GU:   No hematuria, no  pyuria, no dysuria, no flank pain.  Extremities:  No swelling or joint pains.  Objective:  BP 134/61   Pulse 72   Wt 294 lb 12.8 oz (133.7 kg)   BMI 49.06 kg/m     Physical Exam:  General appearance:Awake, alert, in no acute distress  Skin: warm and dry, no rash or erythema    Eyes: conjunctivae normal and sclera anicteric    ENT: :no thrush no pharyngeal congestion      Neck: without any JVD. No carotid bruits or thyromegaly.      Pulmonary: lungs are clear and without any wheezing or rhonchi     Cardiovascular:Normal S1 & S2, No S3 or  S4, No  Pericardial rub , No Murmur     Abdomen: soft nontender, bowel sounds present, no organomegaly,  No ascites      Extremities: no cyanosis, clubbing or edema    Labs:    CBC:   Lab Results   Component Value Date/Time    WBC 8.3 10/14/2021 08:40 AM    RBC 4.16 10/14/2021 08:40 AM    HGB 12.3 10/14/2021 08:40 AM    HCT 37.1 10/14/2021 08:40 AM    MCV 89.2 10/14/2021 08:40 AM    RDW 14.0 10/14/2021 08:40 AM    PLT 218 10/14/2021 08:40 AM    MPV 8.4 10/14/2021 08:40 AM      BMP:   Lab Results   Component Value Date/Time    NA 143 03/12/2022 01:40 PM    NA 145 02/27/2022 03:32 PM    NA 143 02/18/2022 04:20 PM    K 4.7 03/12/2022 01:40 PM    K 4.5 02/27/2022 03:32 PM    K 4.1 02/18/2022 04:20 PM    K 4.6 10/17/2019 06:07 AM    K 3.9 05/05/2017 03:21 AM    CL 107 03/12/2022 01:40 PM    CL 108 02/27/2022 03:32 PM    CL 107 02/18/2022 04:20 PM    CO2 21 03/12/2022 01:40 PM    CO2 24 02/27/2022 03:32 PM    CO2 25 02/18/2022 04:20 PM    BUN 18 03/12/2022 01:40 PM    BUN 20 02/27/2022 03:32 PM    BUN 17 02/18/2022 04:20 PM    CREATININE 1.4 03/12/2022 01:40 PM    CREATININE 1.4 02/27/2022 03:32 PM    CREATININE 1.3 02/18/2022 04:20 PM    GLUCOSE 129 03/12/2022 01:40 PM    GLUCOSE 123 02/27/2022 03:32 PM    GLUCOSE 101 02/18/2022 04:20 PM    CALCIUM 9.5 03/12/2022 01:40 PM    CALCIUM 9.9 02/27/2022 03:32 PM    CALCIUM 9.6 02/18/2022 04:20 PM      BNP:No results found for:  BNP  PHOSPHORUS:    Lab Results   Component Value Date/Time    PHOS 3.8 08/21/2013 03:46 PM    PHOS 3.3 08/18/2013 08:13 AM    PHOS 3.5 08/17/2013 06:49 AM     MAGNESIUM:   Lab Results   Component Value Date/Time    MG 2.20 05/07/2017 04:45 AM     ALBUMIN:   Lab Results   Component Value Date/Time    LABALBU 4.0 02/18/2022 04:30 PM     IRON:    Lab Results   Component Value Date/Time    IRON 93 03/26/2010 05:35 PM     IRON SATURATION:    Lab Results   Component Value Date/Time    LABIRON 24 03/26/2010 05:35 PM     TIBC:    Lab Results   Component Value Date/Time    TIBC 397  03/26/2010 05:35 PM     FERRITIN:    Lab Results   Component Value Date/Time    FERRITIN 61.9 05/02/2009 05:40 PM     ANA:   Lab Results   Component Value Date    ANA Negative 07/26/2019       SPEP:   Lab Results   Component Value Date/Time    PROT 6.8 02/18/2022 04:30 PM    PROT 7.3 03/31/2011 03:07 PM    LABALPH 0.2 03/29/2017 03:33 PM    LABALPH 0.7 03/29/2017 03:33 PM    LABBETA 1.2 03/29/2017 03:33 PM    GAMGLOB 1.2 03/29/2017 03:33 PM     UPEP: No results found for: TPU   HEPBSAG:No results found for: HEPBSAG  HEPCAB:No results found for: HEPCAB  C3: No results found for: C3  C4: No results found for: C4  MPO ANCA: No results found for: MPO .  PR3 ANCA:  No results found for: PR3  PTH: No results found for: PTH    Urine Creatinine:    Lab Results   Component Value Date/Time    LABCREA 241.1 02/13/2021 03:04 PM     Urine Eosinophils: No results found for: UREO  Urine Protein:  No results found for: TPU  Urinalysis:  U/A:   Lab Results   Component Value Date/Time    NITRU NEGATIVE 02/13/2021 03:03 PM    COLORU Yellow 02/13/2021 03:03 PM    PHUR 5.5 11/25/2017 04:24 PM    LABCAST 0-1 Hyaline 07/09/2013 02:09 AM    WBCUA 4 07/16/2014 07:26 PM    RBCUA NEGATIVE 02/13/2021 03:03 PM    MUCUS PRESENT 02/13/2021 03:03 PM    YEAST 0 11/25/2017 04:24 PM    BACTERIA 0 11/25/2017 04:24 PM    BACTERIA 2+ 07/16/2014 07:26 PM    CLARITYU Clear 02/13/2021  03:03 PM    SPECGRAV 1.025 02/13/2021 03:03 PM    LEUKOCYTESUR NEGATIVE 02/13/2021 03:03 PM    LEUKOCYTESUR 0 to 5 02/13/2021 03:03 PM    UROBILINOGEN 2+ (4-6 mg/dL) 02/13/2021 03:03 PM    BILIRUBINUR NEGATIVE 02/13/2021 03:03 PM    BLOODU Negative 11/25/2017 04:24 PM    GLUCOSEU NEGATIVE 02/13/2021 03:03 PM    KETUA NEGATIVE 02/13/2021 03:03 PM         Radiology:  Reviewed as available.    Assessment/Plan:    CKD stage 3 A   Creat 1.4 . Stable   Hold lisinopril since BP is in normal  range       2. H/o CHF but EF improved   Resumed  spironolactone   Has increased edema   Will give torsemide 10 mg po daily    Rpt  kidney function again in 12 weeks       3. H/o adrenal nodule . CT scan in 2020  Check CT abdomen to see the status of adrenal nodule           Discussed with patient in detail.   Counseling done for diet and lifestyle changes.  Explained importance of compliance with medications               Thank you for the consultation.        Dorathy Daft, MD  Nephrology     [facility] Office : [address], [address] , [place] [address]  [facility] Office: [address], [place] [address]  [facility] Office: [address], [place] [address].  [employer] Office: [address]  [place] [address]  Office : [phone]  Fax [phone]

## 2022-03-17 NOTE — Addendum Note (Signed)
Addended bySidonie Dickens, Melanee Cordial on: 03/17/2022 03:20 PM     Modules accepted: Orders

## 2022-03-17 NOTE — Telephone Encounter (Signed)
Changing pharm per patient

## 2022-04-02 NOTE — Telephone Encounter (Signed)
Patient Rachael Mays stating she would like to cancel appt on 4/28. States she is not having any issues at this time and does not feel that this appt is necessary. Patient stated there was no need for a call back unless we had questions, states she will call back to r/s at a later date or if she has issues.

## 2022-04-03 ENCOUNTER — Encounter: Payer: PRIVATE HEALTH INSURANCE | Attending: Family | Primary: Sports Medicine

## 2022-04-05 NOTE — Progress Notes (Signed)
This encounter was created in error - please disregard.

## 2022-04-13 ENCOUNTER — Ambulatory Visit
Admit: 2022-04-13 | Discharge: 2022-04-13 | Payer: PRIVATE HEALTH INSURANCE | Attending: Dermatology | Primary: Sports Medicine

## 2022-04-13 DIAGNOSIS — L237 Allergic contact dermatitis due to plants, except food: Secondary | ICD-10-CM

## 2022-04-13 MED ORDER — CLOBETASOL PROPIONATE 0.05 % EX CREA
0.05 | CUTANEOUS | 1 refills | Status: DC
Start: 2022-04-13 — End: 2024-06-05

## 2022-04-13 NOTE — Progress Notes (Signed)
[facility]  Rachael Breach, MD  [place]  May 19, 1956    67 y.o. female     Date of Visit: 04/13/2022    Chief Complaint: rash, follow up for melanoma    History of Present Illness:    1.  She presents today for an intensely pruritic eruption on the upper arms that started after doing yard work at the end of last week.    2.  She also reports several growths on the face.    3.  She has multiple moles on the trunk and extremities-not aware of any changes in size, color, or shape.    4.  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 Caryn Section on 12/29/2017.  He denies any signs of recurrence.      Review of Systems:  Gen: Feels well, good sense of health.    Past Medical History, Family History, Surgical History, Medications and Allergies reviewed.    Past Medical History:   Diagnosis Date    AKI (acute kidney injury) ([place]) 01/26/2022    Anxiety     Asthma     Blood circulation, collateral     CAD (coronary artery disease)     CHF (congestive heart failure) (HCC)     Chronic back pain     Deep vein thrombosis     Degeneration of thoracic intervertebral disc 10/15/2017    Depression     GERD (gastroesophageal reflux disease)     NO LONGER SINCE LAP    GERD (gastroesophageal reflux disease) 01/23/2009    Hx of blood clots     Hyperlipidemia     hx; resolved with lap band    Hypertension     Idiopathic peripheral neuropathy 03/26/2017    Melanoma ([place])     upper back    MRSA (methicillin resistant staph aureus) culture positive 10/17/2019    leg abscess    Obesity     hx of; had lap band    Obstructive sleep apnea 09/18/2015    uses bi-pap    Obstructive sleep apnea (adult) (pediatric) 09/18/2015    Pulmonary embolism (HCC)     Stenosis of right carotid artery 06/16/2021    Type 2 diabetes mellitus with diabetic polyneuropathy, without long-term current use of insulin ([place])  01/26/2022     Past Surgical History:   Procedure Laterality Date    BACK SURGERY      neck  plates    BREAST SURGERY      left lumpectomy    CERVICAL FUSION      CESAREAN SECTION      CHOLECYSTECTOMY      COLONOSCOPY      COLONOSCOPY  04/08/2010    COLONOSCOPY N/A 04/18/2021    COLONOSCOPY DIAGNOSTIC performed by Norwood Levo, MD at [facility] CATH LAB PROCEDURE      DILATATION, ESOPHAGUS      ENDOSCOPY, COLON, DIAGNOSTIC      HYSTERECTOMY (CERVIX STATUS UNKNOWN)      HYSTERECTOMY, VAGINAL      LAP BAND  05/01/2008    Dr. Nicole Cella    OTHER SURGICAL HISTORY      [place] Filter: curently in place    OTHER SURGICAL HISTORY  07/05/2013    lap band  removal    SHOULDER SURGERY      SKIN CANCER EXCISION  12/29/2017    UPPER GASTROINTESTINAL ENDOSCOPY  05/19/2013    UPPER GASTROINTESTINAL ENDOSCOPY  07/21/2013    ESOPHAGEAL STENT PLACEMENT    UPPER GASTROINTESTINAL ENDOSCOPY N/A 07/31/2014    Esophagogastroduodenoscopy with esophageal balloon dilation    UPPER GASTROINTESTINAL ENDOSCOPY N/A 04/18/2021    EGD DILATION BALLOON performed by Norwood Levo, MD at [place]  04/2015    Lucendia Herrlich, celiac artery angiogram, normal abd arteries       Allergies   Allergen Reactions    Ibuprofen     Lisinopril      Memory loss     Seasonal     Talwin [Pentazocine] Other (See Comments)     dizzy     Outpatient Medications Marked as Taking for the 04/13/22 encounter (Office Visit) with Francisca December, MD   Medication Sig Dispense Refill    torsemide (DEMADEX) 10 MG tablet Take 1 tablet by mouth daily 90 tablet 2    dilTIAZem (CARDIZEM CD) 180 MG extended release capsule TAKE ONE CAPSULE BY MOUTH DAILY 90 capsule 3    omeprazole (PRILOSEC) 40 MG delayed release capsule TAKE ONE CAPSULE BY MOUTH DAILY 30 capsule 0    Tirzepatide (MOUNJARO) 5 MG/0.5ML SOPN SC injection once a week      triamcinolone (KENALOG) 0.1 % cream Apply to affected area twice  daily for up to 2 weeks or until improved. 15 g 2    topiramate (TOPAMAX) 25 MG tablet Take 2 tablets by mouth 2 times daily      montelukast (SINGULAIR) 10 MG tablet TAKE ONE TABLET BY MOUTH DAILY. **MUST CALL MD FOR APPOINTMENT 30 tablet 5    fluocinonide (LIDEX) 0.05 % external solution APPLY SPARINGLY TO THE BACK OF THE SCALP DAILY AS NEEDED FOR ITCHING 60 mL 1    oxyCODONE-acetaminophen (PERCOCET) 7.5-325 MG per tablet as needed.      rosuvastatin (CRESTOR) 20 MG tablet daily      magnesium (MAGNESIUM-OXIDE) 250 MG TABS tablet Take 350 mg by mouth daily      Coenzyme Q10 (CO Q10) 200 MG CAPS Take by mouth daily (with breakfast)      nitroGLYCERIN (NITROSTAT) 0.4 MG SL tablet up to max of 3 total doses. If no relief after 1 dose, call 911. 25 tablet 3    ranolazine (RANEXA) 500 MG extended release tablet Take 1 tablet by mouth 2 times daily 180 tablet 3    calcium citrate-vitamin D (CITRICAL + D) 315-250 MG-UNIT TABS per tablet Take 2 tablets by mouth 2 times daily      diclofenac sodium (VOLTAREN) 1 % GEL APPLY 4 GRAMS TO AFFECTED AREA(S) TWO TIMES A DAY 100 g 1    Cholecalciferol (VITAMIN D3) 50 MCG (2000 UT) CAPS Take 1 capsule by mouth daily 90 capsule 3    albuterol sulfate HFA (PROVENTIL HFA) 108 (90 Base) MCG/ACT inhaler Inhale 2 puffs into the lungs every 6 hours as needed for Wheezing 3 Inhaler 3    lidocaine (LIDODERM) 5 % Place 1 patch onto the skin daily 12 hours on, 12 hours off. (Patient taking differently: Place 1 patch onto the skin as needed 12 hours on, 12 hours off.) 30 patch 0    aspirin 81 MG EC tablet Take 1 tablet by mouth daily 30 tablet 11    Multiple Vitamins-Minerals (MULTIVITAMIN PO) Take 1 tablet by mouth  daily          Physical Examination       The following were examined and determined to be normal: Psych/Neuro, Scalp/hair, Head/face, Conjunctivae/eyelids, Gums/teeth/lips, Neck, Breast/axilla/chest, Abdomen, Back, RLE, LLE, and Nails/digits.    The following were examined and  determined to be abnormal: RUE and LUE.     Well appearing.    1.  Extensor upper extremities with ill defined focally excoriated erythematous papules and patches.     2.  Cheeks with stuck on appearing tan papules and plaques.     3.  Trunk and extremities with multiple well defined round to oval smooth brown macules and papules.     4.  Central upper back - linear surgical scar.          Assessment and Plan     1. Allergic contact dermatitis due to plants    Clobetasol cream twice daily until improved.      2. SK (seborrheic keratosis)     Reassurance.      3. Multiple nevi - benign appearing.     Sun protective behaviors, including use of at least SPF 30 sunscreen, and self skin examinations were encouraged.  Call for any new or concerning lesions.       4. History of T1a melanoma on the upper back - no signs of recurrence. 4 years from dx/tx.     Sun protective behaviors, including use of at least SPF 30 sunscreen, and self skin examinations were encouraged.  Call for any new or concerning lesions.           Return in about 6 months (around 10/14/2022).    --Francisca December, MD

## 2022-05-05 MED ORDER — FLUOCINONIDE 0.05 % EX SOLN
0.05 % | CUTANEOUS | 1 refills | Status: DC
Start: 2022-05-05 — End: 2022-12-01

## 2022-05-19 ENCOUNTER — Encounter

## 2022-05-19 NOTE — Telephone Encounter (Signed)
Received refill request for Ranexa from [facility].    Last ov:02/18/2022 [facility]    Last Refill:06/18/2021    Next appointment:06/03/2022 [facility]

## 2022-05-21 MED ORDER — RANOLAZINE ER 500 MG PO TB12
500 MG | ORAL_TABLET | ORAL | 3 refills | Status: DC
Start: 2022-05-21 — End: 2023-05-28

## 2022-05-31 ENCOUNTER — Encounter

## 2022-06-01 NOTE — Telephone Encounter (Signed)
DENIED, Pt no longer under prescriber care

## 2022-06-02 NOTE — Patient Instructions (Incomplete)
3 mo f/u  Check labs today (bmp) check fasting lipid / lft soon  Tomorrow will discuss with Dr Randolm Idol on a dose for Spironolactone and let pt know   RTC 3 months   Lab Results   Component Value Date/Time    CHOL 149 02/13/2021 03:02 PM    TRIG 116 02/13/2021 03:02 PM    HDL 57 02/18/2022 04:30 PM    HDL 52 03/31/2011 03:07 PM    LDLCALC 77 02/18/2022 04:30 PM     Lab Results   Component Value Date/Time    NA 143 03/12/2022 01:40 PM    K 4.7 03/12/2022 01:40 PM    K 4.6 10/17/2019 06:07 AM    CL 107 03/12/2022 01:40 PM    CO2 21 03/12/2022 01:40 PM    GLUCOSE 129 03/12/2022 01:40 PM    BUN 18 03/12/2022 01:40 PM    CREATININE 1.4 03/12/2022 01:40 PM    CALCIUM 9.5 03/12/2022 01:40 PM    GFR >60 03/16/2012 03:42 PM    GFRAA >60 10/18/2019 05:42 AM    GFRAA >60 03/16/2012 03:42 PM     Lab Results   Component Value Date/Time    ALT 15 02/18/2022 04:30 PM    AST 24 02/18/2022 04:30 PM

## 2022-06-03 ENCOUNTER — Ambulatory Visit
Admit: 2022-06-03 | Discharge: 2022-06-03 | Payer: PRIVATE HEALTH INSURANCE | Attending: Cardiovascular Disease | Primary: Sports Medicine

## 2022-06-03 DIAGNOSIS — I2583 Coronary atherosclerosis due to lipid rich plaque: Secondary | ICD-10-CM

## 2022-06-03 MED ORDER — ISOSORBIDE MONONITRATE ER 30 MG PO TB24
30 MG | ORAL_TABLET | Freq: Every day | ORAL | 3 refills | Status: AC
Start: 2022-06-03 — End: 2023-05-18

## 2022-06-03 MED ORDER — NITROGLYCERIN 0.4 MG SL SUBL
0.4 MG | ORAL_TABLET | SUBLINGUAL | 3 refills | Status: AC
Start: 2022-06-03 — End: ?

## 2022-06-03 NOTE — Progress Notes (Unsigned)
[facility]    06/03/2022    Rachael Mays (DOB:  1956/10/14) is a 66 y.o. female is here for follow up and management of CAD and modifiable risk factors.    Referring Provider: Maximino Sarin, DO    HISTORY:  [name] has a history of coronary artery disease s/p DES of LAD (03/2016), hypertension, hyperlipidemia, diastolic heart failure, and carotid artery stenosis.  She has chronic chest pain and shortness of breath that did not resolve after coronary intervention in 03/2016.  RHC (2017) . Additional history includes DVT/PE,  s/p lap band surgery in 2014. S/p dilation of esophagus 04/2021    02/18/21 LHC showed patent proximal LAD stent with mid 30% stenosis in some views and FFR 0.89.  Medical management was recommended.  LHC 11/22 mild nonobstructive cad with mild myocardial bridging. 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     Today she is here alone. She has been doing okay. Endorses some chest pain, reports swelling. Reports she has been peeing a lot since starting diuretic.       REVIEW OF SYSTEMS:  A complete review of systems was reviewed and is negative except as noted in the history of present illness.    Prior to Visit Medications    Medication Sig Taking? Authorizing Provider   ranolazine (RANEXA) 500 MG extended release tablet TAKE ONE TABLET BY MOUTH TWICE A DAY Yes Court Joy, MD   fluocinonide (LIDEX) 0.05 % external solution APPLY SPARINGLY TO THE BACK OF THE SCALP DAILY AS NEEDED FOR ITCHING Yes Ronelle Nigh, MD   clobetasol (TEMOVATE) 0.05 % cream Apply to affected area twice daily for up to 2 weeks or until improved. Yes Ronelle Nigh, MD   torsemide (DEMADEX) 10 MG tablet Take 1 tablet by mouth daily Yes Elyse Hsu, MD   dilTIAZem (CARDIZEM CD) 180 MG extended release capsule TAKE ONE CAPSULE BY MOUTH DAILY Yes Court Joy, MD   omeprazole (PRILOSEC) 40 MG delayed release capsule TAKE ONE CAPSULE BY MOUTH DAILY Yes [name] Leigh Wojciechowski, APRN -  NP   triamcinolone (KENALOG) 0.1 % cream Apply to affected area twice daily for up to 2 weeks or until improved. Yes Ronelle Nigh, MD   montelukast (SINGULAIR) 10 MG tablet TAKE ONE TABLET BY MOUTH DAILY. **MUST CALL MD FOR APPOINTMENT Yes Kathleen Argue, APRN - NP   oxyCODONE-acetaminophen (PERCOCET) 7.5-325 MG per tablet as needed. Yes Historical Provider, MD   rosuvastatin (CRESTOR) 20 MG tablet daily Yes Historical Provider, MD   magnesium (MAGNESIUM-OXIDE) 250 MG TABS tablet Take 350 mg by mouth daily Yes Historical Provider, MD   Coenzyme Q10 (CO Q10) 200 MG CAPS Take by mouth daily (with breakfast) Yes Historical Provider, MD   nitroGLYCERIN (NITROSTAT) 0.4 MG SL tablet up to max of 3 total doses. If no relief after 1 dose, call 911. Yes Court Joy, MD   calcium citrate-vitamin D (CITRICAL + D) 315-250 MG-UNIT TABS per tablet Take 2 tablets by mouth 2 times daily Yes Historical Provider, MD   diclofenac sodium (VOLTAREN) 1 % GEL APPLY 4 GRAMS TO AFFECTED AREA(S) TWO TIMES A DAY Yes Fanny Dance, MD   Cholecalciferol (VITAMIN D3) 50 MCG (2000 UT) CAPS Take 1 capsule by mouth daily Yes [name] Leigh Wojciechowski, APRN - NP   albuterol sulfate HFA (PROVENTIL HFA) 108 (90 Base) MCG/ACT inhaler Inhale 2 puffs into the lungs every 6 hours as needed for Wheezing  Yes Leanne Lovely, APRN - CNP   lidocaine (LIDODERM) 5 % Place 1 patch onto the skin daily 12 hours on, 12 hours off.  Patient taking differently: Place 1 patch onto the skin as needed 12 hours on, 12 hours off. Yes Gypsy Lore Wanninger, PA-C   aspirin 81 MG EC tablet Take 1 tablet by mouth daily Yes Lum Keas, MD   Multiple Vitamins-Minerals (MULTIVITAMIN PO) Take 1 tablet by mouth daily  Yes Historical Provider, MD   Tirzepatide Greggory Keen) 5 MG/0.5ML SOPN SC injection once a week  Patient not taking: Reported on 06/03/2022  Historical Provider, MD   topiramate (TOPAMAX) 25 MG tablet Take 2 tablets by mouth 2 times  daily  Patient not taking: Reported on 06/03/2022  Historical Provider, MD        Allergies   Allergen Reactions    Ibuprofen     Lisinopril      Memory loss     Seasonal     Talwin [Pentazocine] Other (See Comments)     dizzy       Past Medical History:   Diagnosis Date    AKI (acute kidney injury) (HCC) 01/26/2022    Anxiety     Asthma     Blood circulation, collateral     CAD (coronary artery disease)     CHF (congestive heart failure) (HCC)     Chronic back pain     Deep vein thrombosis     Degeneration of thoracic intervertebral disc 10/15/2017    Depression     GERD (gastroesophageal reflux disease)     NO LONGER SINCE LAP    GERD (gastroesophageal reflux disease) 01/23/2009    Hx of blood clots     Hyperlipidemia     hx; resolved with lap band    Hypertension     Idiopathic peripheral neuropathy 03/26/2017    Melanoma (HCC)     upper back    MRSA (methicillin resistant staph aureus) culture positive 10/17/2019    leg abscess    Obesity     hx of; had lap band    Obstructive sleep apnea 09/18/2015    uses bi-pap    Obstructive sleep apnea (adult) (pediatric) 09/18/2015    Pulmonary embolism (HCC)     Stenosis of right carotid artery 06/16/2021    Type 2 diabetes mellitus with diabetic polyneuropathy, without long-term current use of insulin (HCC) 01/26/2022       Past Surgical History:   Procedure Laterality Date    BACK SURGERY      neck  plates    BREAST SURGERY      left lumpectomy    CERVICAL FUSION      CESAREAN SECTION      CHOLECYSTECTOMY      COLONOSCOPY      COLONOSCOPY  04/08/2010    COLONOSCOPY N/A 04/18/2021    COLONOSCOPY DIAGNOSTIC performed by Kandice Robinsons, MD at [facility]    CORONARY ANGIOPLASTY      DIAGNOSTIC CARDIAC CATH LAB PROCEDURE      DILATATION, ESOPHAGUS      ENDOSCOPY, COLON, DIAGNOSTIC      HYSTERECTOMY (CERVIX STATUS UNKNOWN)      HYSTERECTOMY, VAGINAL      LAP BAND  05/01/2008    Dr. Suzi Roots    OTHER SURGICAL HISTORY      [place] Filter: curently in place    OTHER  SURGICAL HISTORY  07/05/2013    lap band removal  SHOULDER SURGERY      SKIN CANCER EXCISION  12/29/2017    UPPER GASTROINTESTINAL ENDOSCOPY  05/19/2013    UPPER GASTROINTESTINAL ENDOSCOPY  07/21/2013    ESOPHAGEAL STENT PLACEMENT    UPPER GASTROINTESTINAL ENDOSCOPY N/A 07/31/2014    Esophagogastroduodenoscopy with esophageal balloon dilation    UPPER GASTROINTESTINAL ENDOSCOPY N/A 04/18/2021    EGD DILATION BALLOON performed by Kandice Robinsons, MD at [facility]    VASCULAR SURGERY  04/2015    Donzetta Sprung, celiac artery angiogram, normal abd arteries       Social History     Tobacco Use    Smoking status: Former     Packs/day: 0.50     Years: 40.00     Pack years: 20.00     Types: Cigarettes     Quit date: 08/07/2012     Years since quitting: 9.8    Smokeless tobacco: Former     Quit date: 06/16/2013    Tobacco comments:     started to smoke at age 41 / only smoked 0.5 p.p.d    Substance Use Topics    Alcohol use: No     Alcohol/week: 0.0 standard drinks        Family History   Problem Relation Age of Onset    Arthritis Mother     Cancer Mother     Depression Mother     High Blood Pressure Mother     Heart Disease Mother 40        MI     Ovarian Cancer Mother 2    Cancer Father 71        colon    Heart Disease Maternal Grandmother     Early Death Paternal Grandmother     Heart Disease Paternal Grandfather     Diabetes Other     High Blood Pressure Other     Obesity Other     Other Sister         OSA    Other Brother         OSA    Other Brother         OSA    Other Daughter         OSA       PHYSICAL EXAMINATION:  Vitals:    06/03/22 1403   BP: 118/64   Pulse: 68   SpO2: 98%   Weight: 298 lb (135.2 kg)   Height: 5\' 5"  (1.651 m)       Estimated body mass index is 49.59 kg/m as calculated from the following:    Height as of this encounter: 5\' 5"  (1.651 m).    Weight as of this encounter: 298 lb (135.2 kg).    Physical Exam  Constitutional:       Appearance: She is well-developed. She is not diaphoretic.   HENT:       Head: Normocephalic and atraumatic.   Eyes:      General: No scleral icterus.     Extraocular Movements: Extraocular movements intact.      Conjunctiva/sclera: Conjunctivae normal.   Neck:      Vascular: No JVD.   Cardiovascular:      Rate and Rhythm: Normal rate and regular rhythm.      Heart sounds: Murmur heard.     No friction rub. No gallop.      Comments: 2/6 RUSB  Pulmonary:      Effort: Pulmonary effort is normal. No respiratory distress.  Breath sounds: Normal breath sounds. No wheezing or rales.   Abdominal:      General: Bowel sounds are normal.      Palpations: Abdomen is soft.      Tenderness: There is no abdominal tenderness.   Musculoskeletal:         General: Normal range of motion.      Cervical back: Normal range of motion and neck supple.      Right lower leg: Edema present.      Left lower leg: Edema present.      Comments: +1 bilaterally     Skin:     General: Skin is warm and dry.      Findings: No rash.   Neurological:      General: No focal deficit present.      Mental Status: She is alert and oriented to person, place, and time.         I have reviewed all pertinent lab results and diagnostic testing.        LABS:  CBC:   Lab Results   Component Value Date/Time    WBC 8.3 10/14/2021 08:40 AM    RBC 4.16 10/14/2021 08:40 AM    HGB 12.3 10/14/2021 08:40 AM    HCT 37.1 10/14/2021 08:40 AM    MCV 89.2 10/14/2021 08:40 AM    RDW 14.0 10/14/2021 08:40 AM    PLT 218 10/14/2021 08:40 AM     CMP:   Lab Results   Component Value Date/Time    NA 143 03/12/2022 01:40 PM    K 4.7 03/12/2022 01:40 PM    K 4.6 10/17/2019 06:07 AM    CL 107 03/12/2022 01:40 PM    CO2 21 03/12/2022 01:40 PM    BUN 18 03/12/2022 01:40 PM    CREATININE 1.4 03/12/2022 01:40 PM    GFRAA >60 10/18/2019 05:42 AM    GFRAA >60 03/16/2012 03:42 PM    AGRATIO 0.9 10/16/2019 04:55 PM    LABGLOM 42 03/12/2022 01:40 PM    GLUCOSE 129 03/12/2022 01:40 PM    PROT 6.8 02/18/2022 04:30 PM    PROT 7.3 03/31/2011 03:07 PM    CALCIUM 9.5  03/12/2022 01:40 PM    BILITOT 0.4 02/18/2022 04:30 PM    ALKPHOS 97 02/18/2022 04:30 PM    AST 24 02/18/2022 04:30 PM    ALT 15 02/18/2022 04:30 PM     Lab Results   Component Value Date/Time    CHOL 149 02/13/2021 03:02 PM    TRIG 116 02/13/2021 03:02 PM    HDL 57 02/18/2022 04:30 PM    HDL 52 03/31/2011 03:07 PM    LDLCALC 77 02/18/2022 04:30 PM       PT/INR: No results found for: PTINR     Cardiac cath 10/14/21 Berna Bue)  Anatomy:   LM-normal  LAD-patent proximal stent with 10% in-stent restenosis, 30% mid with mild myocardial bridging  Cx-10% to 20% proximal  OM1-normal  RCA-normal  LVEF-70%     Impression:  1.  Mild nonobstructive CAD.  2.  Hyperdynamic LV systolic function.  3.  High resting heart rate.     Plan:  1.  Recommend addition of diltiazem CD 180 mg daily.  To accommodate addition of diltiazem reduce Ranexa to 500 mg twice daily.  Also would decrease lisinopril to 20 mg daily to allow for enough blood pressure to accommodate addition of diltiazem.  Diltiazem may be helpful should there be a component of microvascular  angina.    Cardiac cath 02/18/21 ([facility])   Angiographic Findings:  Right dominant system   Left Main:  Normal     Left Anterior Descending:  Patent proximal stent. Mid 30% stenosis,   irregular appearing in some views. FFR 0.89.   Circumflex:  No obstructive disease   Right Coronary:  No obstructive disease   Left Ventriculogram:  LVEF 55-60%     Hemodynamics (mm Hg):   Left Ventricular Pressure:  102/49   Central Aortic Pressure:  125/0, 6     Conclusions:   -No obstructive CAD   Recommendations:   -Follow-up with GI   -Continue medical therapy     Lexiscan myoview 01/29/21 ([facility])  Interpetation Summary:   1. No no change of baseline chest pain after regadenoson injection.   2. No evidence of stress-induced ischemia on EKG.   3. There is evidence of ischemia in the territory subtended by the left anterior descending coronary artery.   4. Normal LV systolic function.         Carotid duplex 11/12/20: Serena Colonel)      o 50-69% stenosis of the right internal carotid artery based on velocity        criteria.      o No hemodynamically significant lesions are identified on the left        internal or common carotid(s).      o There is antegrade flow demonstrated in the right vertebral artery.      o The left vertebral artery could not be visualized.     Echocardiogram 05/27/20: ([facility])     The left ventricle is normal in size.   There is normal left ventricular wall thickness.   Left ventricular systolic function is normal. (JOAC>/=16%)   Overall left ventricular ejection fraction is estimated to be 60-65%.   Normal Diastolic Function.   No significant valvular abnormalities identified.         Procedure:          LHC 08/09/19  Indication:          Unstable angina     Procedure Details:  Consent Access Bleed R Sedat Start Stop Versed Fentanyl Contrast Fluoro EBL Comp Spec   Yes RRA low MCSFC 1457 1522 4 150 51 1.9 <20 None None   *Ultrasound Note: Ultrasound guidance used to determine aforementioned artery patency, size (>51mm), anatomic variations and ideal puncture location.  Real-time ultrasound utilized concurrent with vascular needle entry into the artery.  Image(s) permanently recorded and reported in the patient chart.  *Sedation Note: MCSFC = minimal conscious sedation for comfort     Findings:  Artery Findings/Result   LM Normal   LAD Widely patent stent, 50% ostial D1 jailed by stent (no change)   Cx Normal   RI NA   RCA Normal   LVEDP 6   LVG 60%      Intervention:         None     Post Cath Dx:       Nonobstructive CAD, patent stent LAD    Cardiac Cath 05/07/17:  Anatomy:   LM-normal  LAD-normal, widely patent stent mid  Cx-normal  OM1- normal  RCA-normal, right dominant  RPDA- normal  LVEF- 60%     Impression:  1.  Normal coronary artery anatomy.  2.  Widely patent mid LAD stent.  3.  Normal LV systolic function.  4.  False positive stress test.  Likely breast  artifact.  Plan:  1.  Medical management.  2.  She needs workup of noncardiac causes of chest pain.  She now has to stress test with similar abnormalities most consistent with breast artifact.  The majority of her coronary angiograms have demonstrated normal anatomy except for the one that had a 70% mid LAD stenosis which was stented.  Her chest pain has atypical features.  Recommend rheumatology and possibly orthopedic evaluation.    Echo 08/05/16 ([address] [place], [place])  LV with normal systolic function,  EF 55-60%  Mild MR with multiple jets  Very mild AS  Mild TR    Cardiac Cath 07/17/16:  Anatomy:   LM-normal  LAD-widely patent stent otherwise normal  Cx-normal, dominant  OM1- normal  RCA-normal  LVEF- 70%, LVEDP 15 mmHg     Impression:  1.  Widely patent LAD stent otherwise normal coronary arteries  2.  Hyperdynamic LV systolic function  3.  Non-cardiac chest pain     Plan:  1.  Medical management    Cardiac Cath 03/31/16:  Anatomy:   LM-normal   LAD-70% mid with FFR 0.77  Cx-normal, codominant  OM1- normal  RCA-normal  RPDA- normal  LVEF- 70%, LVEDP 19   PCI: LAD 70% to 0% with 3.25 mm x 18 mm Xience Alpine drug-eluting stent     Hemodynamics:  RA-15/12 mean 9  RV- 60/0, 9  PAWP-20/26 mean 19  PA- 60/12 mean 33      Impression:  1.  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  2.  Hyperdynamic LV systolic function.  3.  Mildly decompensated diastolic CHF.  4.  Moderate pulmonary hypertension.     Cardiac cath 09/23/15: normal coronary arteries      ASSESSMENT/PLAN:  1. Coronary artery disease due to lipid rich plaque  - 03/31/16 LHC - mid LAD 70% stenosis with FFR 0.77.  S/p DES x 1.   - 07/17/16 LHC - patent stent,   - 05/07/17 LHC - patent stent,  (false positive nuclear stress test  - 08/09/19 LHC - widely patent LAD stent, 50% ostial D1 jailed by stent (no change)  - 01/29/21 SPECT - ischemia in LAD territory  - 02/18/21 LHC - right dominant system.  Patent proximal LAD  stent, mid 30% stenosis, irregular appearing in some views. FFR 0.89.  Medical management.  Started on Ranexa  - 10/14/21 LHC - mild nonobstructive CAD, microvascular cad, cont medical management, add diltiazem  -  Ranexa, ASA, statin, diltiazem   Taking PRN nitro near daily for chest discomfort pain described as sharp, stabbing at rest     Plan:  recommend starting imdur. She will call the office if this improves pain but does not resolve it to increase dose. She will also keep BP log to make sure her pressure can tolerate increasing.     2. Chronic diastolic congestive heart failure (HCC)  3. Pulmonary hypertension  - 03/2016 Right heart cath - mildly decompensated heart failure with moderate pulmonary hypertension (PAWP-20/26 mean 19 ; PA- 60/12 mean 33)   - 04/06/16 Echo - EF 65%, mild CLVH, RVSP 42 mmHg  - 05/27/20 Echo - EF 60-65%, normal diastolic function, function is normal.   - torsemide  -  weight is up per pt and she is sob with 1+ edema bilat LE (pro Bnp 190)    Plan : repeat echocardiogram    4. Essential hypertension  BP 118/64   Pulse 68   Ht 5\' 5"  (  1.651 m)   Wt 298 lb (135.2 kg)   SpO2 98%   BMI 49.59 kg/m    - torsemide   Plan : Stable.  Continue current medical regimen.    5. Dyslipidemia  02/17/21 - TC- 141, TG- 127, HDL- 47, LDL- 69.  AST- 47, ALT- 47  07/09/21 TC 150 TG 142 HDL 46 LDL 79. LFT normal.    02/18/22 TC 154 TG 100 HDL 57 LDL 77. LFT normal.    - crestor 20 mg , CoQ 10     Plan :  LDL still not at goal <70. Increase crestor to 40. Recheck lipids in 3 months    6. Stenosis of right carotid artery  - 11/12/20 carotid duplex ([facility]) - RICA with 50-69% stenosis of RICA, no significant plaque in Rt common carotid artery.    No hemodynamically significant stenosis of left ICA or common carotid artery.   - 11/2021  <50 % bilaterally     Plan : Carotid duplex next year     7. Weight management   Working with weight management team       Plan :  Add imdur   Increase crestor to 40mg , Recheck  lipid and hepatic function in 3 months  Repeat echocardiogram      No follow-ups on file.    An  electronic signature was used to authenticate this note.    Sheria Lang. Berna Bue, MD, [facility], [facility]    Scribe's Attestation: This note was scribed in the presence of Dr. Wonda Cerise, MD by Leanor Rubenstein, RN.     Physician Attestation: The scribe's documentation has been prepared under my direction and personally reviewed by me in its entirety.  I confirm that the note above accurately reflects all work, treatment, procedures, and medical decision making performed by me.

## 2022-06-03 NOTE — Progress Notes (Deleted)
[place]    06/03/2022    Rachael Mays (DOB:  20-May-1956) is a 66 y.o. female is here for follow up and management of CAD and modifiable risk factors.    Referring Provider: Myer Peer, DO    HISTORY:  Rachael Mays has a history of coronary artery disease s/p DES of LAD (03/2016), hypertension, hyperlipidemia, diastolic heart failure, and carotid artery stenosis.  She has chronic chest pain and shortness of breath that did not resolve after coronary intervention in 03/2016.  RHC (2017) . Additional history includes DVT/PE,  s/p lap band surgery in 2014. S/p dilation of esophagus 04/2021    02/18/21 LHC showed patent proximal LAD stent with mid 30% stenosis in some views and FFR 0.89.  Medical management was recommended.  LHC 11/22 mild nonobstructive cad with mild myocardial bridging. 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     Today she ***      REVIEW OF SYSTEMS:  A complete review of systems was reviewed and is negative except as noted in the history of present illness.    Prior to Visit Medications    Medication Sig Taking? Authorizing Provider   ranolazine (RANEXA) 500 MG extended release tablet TAKE ONE TABLET BY MOUTH TWICE A DAY  Leodis Binet, MD   fluocinonide (LIDEX) 0.05 % external solution APPLY SPARINGLY TO THE BACK OF THE SCALP DAILY AS NEEDED FOR ITCHING  Francisca December, MD   clobetasol (TEMOVATE) 0.05 % cream Apply to affected area twice daily for up to 2 weeks or until improved.  Francisca December, MD   torsemide (DEMADEX) 10 MG tablet Take 1 tablet by mouth daily  Dorathy Daft, MD   dilTIAZem (CARDIZEM CD) 180 MG extended release capsule TAKE ONE CAPSULE BY MOUTH DAILY  Leodis Binet, MD   omeprazole (PRILOSEC) 40 MG delayed release capsule TAKE ONE CAPSULE BY MOUTH DAILY  [name] Leigh Wojciechowski, APRN - NP   Tirzepatide [facility]) 5 MG/0.5ML SOPN SC injection once a week  Historical Provider, MD   triamcinolone (KENALOG) 0.1 % cream Apply to affected area  twice daily for up to 2 weeks or until improved.  Francisca December, MD   topiramate (TOPAMAX) 25 MG tablet Take 2 tablets by mouth 2 times daily  Historical Provider, MD   montelukast (SINGULAIR) 10 MG tablet TAKE ONE TABLET BY MOUTH DAILY. **MUST CALL MD FOR APPOINTMENT  Marguarite Arbour Sheila Oats, APRN - NP   oxyCODONE-acetaminophen (PERCOCET) 7.5-325 MG per tablet as needed.  Historical Provider, MD   rosuvastatin (CRESTOR) 20 MG tablet daily  Historical Provider, MD   magnesium (MAGNESIUM-OXIDE) 250 MG TABS tablet Take 350 mg by mouth daily  Historical Provider, MD   Coenzyme Q10 (CO Q10) 200 MG CAPS Take by mouth daily (with breakfast)  Historical Provider, MD   nitroGLYCERIN (NITROSTAT) 0.4 MG SL tablet up to max of 3 total doses. If no relief after 1 dose, call 911.  Leodis Binet, MD   calcium citrate-vitamin D (CITRICAL + D) 315-250 MG-UNIT TABS per tablet Take 2 tablets by mouth 2 times daily  Historical Provider, MD   diclofenac sodium (VOLTAREN) 1 % GEL APPLY 4 GRAMS TO AFFECTED AREA(S) TWO TIMES A DAY  Jamal Collin, MD   Cholecalciferol (VITAMIN D3) 50 MCG (2000 UT) CAPS Take 1 capsule by mouth daily  [name] Sheila Oats, APRN - NP   albuterol sulfate HFA (PROVENTIL HFA) 108 (90 Base) MCG/ACT inhaler Inhale 2  puffs into the lungs every 6 hours as needed for Wheezing  Almira Coaster, APRN - CNP   lidocaine (LIDODERM) 5 % Place 1 patch onto the skin daily 12 hours on, 12 hours off.  Patient taking differently: Place 1 patch onto the skin as needed 12 hours on, 12 hours off.  Sharmaine Base Wanninger, PA-C   aspirin 81 MG EC tablet Take 1 tablet by mouth daily  Harvel Quale, MD   Multiple Vitamins-Minerals (MULTIVITAMIN PO) Take 1 tablet by mouth daily   Historical Provider, MD        Allergies   Allergen Reactions    Ibuprofen     Lisinopril      Memory loss     Seasonal     Talwin [Pentazocine] Other (See Comments)     dizzy       Past Medical History:   Diagnosis Date    AKI (acute  kidney injury) ([place]) 01/26/2022    Anxiety     Asthma     Blood circulation, collateral     CAD (coronary artery disease)     CHF (congestive heart failure) (HCC)     Chronic back pain     Deep vein thrombosis     Degeneration of thoracic intervertebral disc 10/15/2017    Depression     GERD (gastroesophageal reflux disease)     NO LONGER SINCE LAP    GERD (gastroesophageal reflux disease) 01/23/2009    Hx of blood clots     Hyperlipidemia     hx; resolved with lap band    Hypertension     Idiopathic peripheral neuropathy 03/26/2017    Melanoma ([place])     upper back    MRSA (methicillin resistant staph aureus) culture positive 10/17/2019    leg abscess    Obesity     hx of; had lap band    Obstructive sleep apnea 09/18/2015    uses bi-pap    Obstructive sleep apnea (adult) (pediatric) 09/18/2015    Pulmonary embolism (HCC)     Stenosis of right carotid artery 06/16/2021    Type 2 diabetes mellitus with diabetic polyneuropathy, without long-term current use of insulin ([place]) 01/26/2022       Past Surgical History:   Procedure Laterality Date    BACK SURGERY      neck  plates    BREAST SURGERY      left lumpectomy    CERVICAL FUSION      CESAREAN SECTION      CHOLECYSTECTOMY      COLONOSCOPY      COLONOSCOPY  04/08/2010    COLONOSCOPY N/A 04/18/2021    COLONOSCOPY DIAGNOSTIC performed by Norwood Levo, MD at [place] CATH LAB PROCEDURE      DILATATION, ESOPHAGUS      ENDOSCOPY, COLON, DIAGNOSTIC      HYSTERECTOMY (CERVIX STATUS UNKNOWN)      HYSTERECTOMY, VAGINAL      LAP BAND  05/01/2008    Dr. Nicole Cella    OTHER SURGICAL HISTORY      [place] Filter: curently in place    OTHER SURGICAL HISTORY  07/05/2013    lap band removal    SHOULDER SURGERY      SKIN CANCER EXCISION  12/29/2017    UPPER GASTROINTESTINAL ENDOSCOPY  05/19/2013    UPPER GASTROINTESTINAL ENDOSCOPY  07/21/2013    ESOPHAGEAL STENT PLACEMENT  UPPER GASTROINTESTINAL ENDOSCOPY N/A 07/31/2014     Esophagogastroduodenoscopy with esophageal balloon dilation    UPPER GASTROINTESTINAL ENDOSCOPY N/A 04/18/2021    EGD DILATION BALLOON performed by Norwood Levo, MD at [place]  04/2015    Lucendia Herrlich, celiac artery angiogram, normal abd arteries       Social History     Tobacco Use    Smoking status: Former     Packs/day: 0.50     Years: 40.00     Pack years: 20.00     Types: Cigarettes     Quit date: 08/07/2012     Years since quitting: 9.8    Smokeless tobacco: Former     Quit date: 06/16/2013    Tobacco comments:     started to smoke at age 34 / only smoked 0.5 p.p.d    Substance Use Topics    Alcohol use: No     Alcohol/week: 0.0 standard drinks        Family History   Problem Relation Age of Onset    Arthritis Mother     Cancer Mother     Depression Mother     High Blood Pressure Mother     Heart Disease Mother 68        MI     Ovarian Cancer Mother 41    Cancer Father 79        colon    Heart Disease Maternal Grandmother     Early Death Paternal Grandmother     Heart Disease Paternal Grandfather     Diabetes Other     High Blood Pressure Other     Obesity Other     Other Sister         OSA    Other Brother         OSA    Other Brother         OSA    Other Daughter         OSA       PHYSICAL EXAMINATION:  There were no vitals filed for this visit.      Estimated body mass index is 49.06 kg/m as calculated from the following:    Height as of 02/18/22: '5\' 5"'$  (1.651 m).    Weight as of 03/17/22: 294 lb 12.8 oz (133.7 kg).    Physical Exam  Constitutional:       Appearance: She is well-developed. She is not diaphoretic.   HENT:      Head: Normocephalic and atraumatic.   Eyes:      General: No scleral icterus.     Extraocular Movements: Extraocular movements intact.      Conjunctiva/sclera: Conjunctivae normal.   Neck:      Vascular: No JVD.   Cardiovascular:      Rate and Rhythm: Normal rate and regular rhythm.      Heart sounds: Murmur heard.     No friction rub. No gallop.      Comments:  2/6 RUSB  Pulmonary:      Effort: Pulmonary effort is normal. No respiratory distress.      Breath sounds: Normal breath sounds. No wheezing or rales.   Abdominal:      General: Bowel sounds are normal.      Palpations: Abdomen is soft.      Tenderness: There is no abdominal tenderness.   Musculoskeletal:         General: Normal range of motion.  Cervical back: Normal range of motion and neck supple.      Right lower leg: Edema present.      Left lower leg: Edema present.      Comments: +1 bilaterally     Skin:     General: Skin is warm and dry.      Findings: No rash.   Neurological:      General: No focal deficit present.      Mental Status: She is alert and oriented to person, place, and time.         I have reviewed all pertinent lab results and diagnostic testing.        LABS:  CBC:   Lab Results   Component Value Date/Time    WBC 8.3 10/14/2021 08:40 AM    RBC 4.16 10/14/2021 08:40 AM    HGB 12.3 10/14/2021 08:40 AM    HCT 37.1 10/14/2021 08:40 AM    MCV 89.2 10/14/2021 08:40 AM    RDW 14.0 10/14/2021 08:40 AM    PLT 218 10/14/2021 08:40 AM     CMP:   Lab Results   Component Value Date/Time    NA 143 03/12/2022 01:40 PM    K 4.7 03/12/2022 01:40 PM    K 4.6 10/17/2019 06:07 AM    CL 107 03/12/2022 01:40 PM    CO2 21 03/12/2022 01:40 PM    BUN 18 03/12/2022 01:40 PM    CREATININE 1.4 03/12/2022 01:40 PM    GFRAA >60 10/18/2019 05:42 AM    GFRAA >60 03/16/2012 03:42 PM    AGRATIO 0.9 10/16/2019 04:55 PM    LABGLOM 42 03/12/2022 01:40 PM    GLUCOSE 129 03/12/2022 01:40 PM    PROT 6.8 02/18/2022 04:30 PM    PROT 7.3 03/31/2011 03:07 PM    CALCIUM 9.5 03/12/2022 01:40 PM    BILITOT 0.4 02/18/2022 04:30 PM    ALKPHOS 97 02/18/2022 04:30 PM    AST 24 02/18/2022 04:30 PM    ALT 15 02/18/2022 04:30 PM     Lab Results   Component Value Date/Time    CHOL 149 02/13/2021 03:02 PM    TRIG 116 02/13/2021 03:02 PM    HDL 57 02/18/2022 04:30 PM    HDL 52 03/31/2011 03:07 PM    LDLCALC 77 02/18/2022 04:30 PM       PT/INR:  No results found for: PTINR     Cardiac cath 10/14/21 [facility])  Anatomy:   LM-normal  LAD-patent proximal stent with 10% in-stent restenosis, 30% mid with mild myocardial bridging  Cx-10% to 20% proximal  OM1-normal  RCA-normal  LVEF-70%     Impression:  1.  Mild nonobstructive CAD.  2.  Hyperdynamic LV systolic function.  3.  High resting heart rate.     Plan:  1.  Recommend addition of diltiazem CD 180 mg daily.  To accommodate addition of diltiazem reduce Ranexa to 500 mg twice daily.  Also would decrease lisinopril to 20 mg daily to allow for enough blood pressure to accommodate addition of diltiazem.  Diltiazem may be helpful should there be a component of microvascular angina.    Cardiac cath 02/18/21 ([facility])   Angiographic Findings:  Right dominant system   Left Main:  Normal     Left Anterior Descending:  Patent proximal stent. Mid 30% stenosis,   irregular appearing in some views. FFR 0.89.   Circumflex:  No obstructive disease   Right Coronary:  No obstructive disease   Left  Ventriculogram:  LVEF 55-60%     Hemodynamics (mm Hg):   Left Ventricular Pressure:  102/49   Central Aortic Pressure:  125/0, 6     Conclusions:   -No obstructive CAD   Recommendations:   -Follow-up with GI   -Continue medical therapy     Lexiscan myoview 01/29/21 ([facility])  Interpetation Summary:   1. No no change of baseline chest pain after regadenoson injection.   2. No evidence of stress-induced ischemia on EKG.   3. There is evidence of ischemia in the territory subtended by the left anterior descending coronary artery.   4. Normal LV systolic function.        Carotid duplex 11/12/20: [facility])      o 50-69% stenosis of the right internal carotid artery based on velocity        criteria.      o No hemodynamically significant lesions are identified on the left        internal or common carotid(s).      o There is antegrade flow demonstrated in the right vertebral artery.      o The left vertebral artery could not be  visualized.     Echocardiogram 05/27/20: ([facility])     The left ventricle is normal in size.   There is normal left ventricular wall thickness.   Left ventricular systolic function is normal. (LVEF>/=55%)   Overall left ventricular ejection fraction is estimated to be 60-65%.   Normal Diastolic Function.   No significant valvular abnormalities identified.         Procedure:          LHC 08/09/19  Indication:          Unstable angina     Procedure Details:  Consent Access Bleed R Sedat Start Stop Versed Fentanyl Contrast Fluoro EBL Comp Spec   Yes RRA low MCSFC 1457 1522 4 150 51 1.9 <20 None None   *Ultrasound Note: Ultrasound guidance used to determine aforementioned artery patency, size (>72m), anatomic variations and ideal puncture location.  Real-time ultrasound utilized concurrent with vascular needle entry into the artery.  Image(s) permanently recorded and reported in the patient chart.  *Sedation Note: MCSFC = minimal conscious sedation for comfort     Findings:  Artery Findings/Result   LM Normal   LAD Widely patent stent, 50% ostial D1 jailed by stent (no change)   Cx Normal   RI NA   RCA Normal   LVEDP 6   LVG 60%      Intervention:         None     Post Cath Dx:       Nonobstructive CAD, patent stent LAD    Cardiac Cath 05/07/17:  Anatomy:   LM-normal  LAD-normal, widely patent stent mid  Cx-normal  OM1- normal  RCA-normal, right dominant  RPDA- normal  LVEF- 60%     Impression:  1.  Normal coronary artery anatomy.  2.  Widely patent mid LAD stent.  3.  Normal LV systolic function.  4.  False positive stress test.  Likely breast artifact.     Plan:  1.  Medical management.  2.  She needs workup of noncardiac causes of chest pain.  She now has to stress test with similar abnormalities most consistent with breast artifact.  The majority of her coronary angiograms have demonstrated normal anatomy except for the one that had a 70% mid LAD stenosis which was stented.  Her chest pain has  atypical features.   Recommend rheumatology and possibly orthopedic evaluation.    Echo 08/05/16 ([place])  LV with normal systolic function,  EF 51-88%  Mild MR with multiple jets  Very mild AS  Mild TR    Cardiac Cath 07/17/16:  Anatomy:   LM-normal  LAD-widely patent stent otherwise normal  Cx-normal, dominant  OM1- normal  RCA-normal  LVEF- 70%, LVEDP 15 mmHg     Impression:  1.  Widely patent LAD stent otherwise normal coronary arteries  2.  Hyperdynamic LV systolic function  3.  Non-cardiac chest pain     Plan:  1.  Medical management    Cardiac Cath 03/31/16:  Anatomy:   LM-normal   LAD-70% mid with FFR 0.77  Cx-normal, codominant  OM1- normal  RCA-normal  RPDA- normal  LVEF- 70%, LVEDP 19   PCI: LAD 70% to 0% with 3.25 mm x 18 mm Xience Alpine drug-eluting stent     Hemodynamics:  RA-15/12 mean 9  RV- 60/0, 9  PAWP-20/26 mean 19  PA- 60/12 mean 33      Impression:  1.  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  2.  Hyperdynamic LV systolic function.  3.  Mildly decompensated diastolic CHF.  4.  Moderate pulmonary hypertension.     Cardiac cath 09/23/15: normal coronary arteries      ASSESSMENT/PLAN:  1. Coronary artery disease due to lipid rich plaque  - 03/31/16 LHC - mid LAD 70% stenosis with FFR 0.77.  S/p DES x 1.   - 07/17/16 LHC - patent stent,   - 05/07/17 LHC - patent stent,  (false positive nuclear stress test  - 08/09/19 LHC - widely patent LAD stent, 50% ostial D1 jailed by stent (no change)  - 01/29/21 SPECT - ischemia in LAD territory  - 02/18/21 LHC - right dominant system.  Patent proximal LAD stent, mid 30% stenosis, irregular appearing in some views. FFR 0.89.  Medical management.  Started on Ranexa  - 10/14/21 LHC - mild nonobstructive CAD, microvascular cad, cont medical management, add diltiazem  -  Ranexa, ASA, statin, diltiazem     Plan:  ***    2. Chronic diastolic congestive heart failure ([place])  3. Pulmonary hypertension  - 03/2016 Right heart cath - mildly  decompensated heart failure with moderate pulmonary hypertension (PAWP-20/26 mean 19 ; PA- 60/12 mean 33)   - 04/06/16 Echo - EF 65%, mild CLVH, RVSP 42 mmHg  - 05/27/20 Echo - EF 41-66%, normal diastolic function, function is normal.   - torsemide,   -  weight is up per pt and she is sob with 1+ edema bilat LE (pro Bnp 190)    Plan :  ***    4. Essential hypertension  There were no vitals taken for this visit.   - torsemide   Plan : Stable.  Continue current medical regimen.    5. Dyslipidemia  02/17/21 - TC- 141, TG- 127, HDL- 47, LDL- 69.  AST- 47, ALT- 47  07/09/21 TC 150 TG 142 HDL 46 LDL 79. LFT normal.    02/18/22 TC 154 TG 100 HDL 57 LDL 77. LFT normal.    - crestor 20 mg , CoQ 10     Plan :  LDL still not at goal <70 ***    6. Stenosis of right carotid artery  - 11/12/20 carotid duplex ([facility]) - RICA with 50-69% stenosis of RICA, no significant plaque in Rt common carotid artery.  No hemodynamically significant stenosis of left ICA or common carotid artery.   - 11/2021  <50 % bilaterally     Plan : Carotid duplex next year     Plan :  ***      No follow-ups on file.    An  electronic signature was used to authenticate this note.    Stephannie Li. [place], MD, [facility], [facility]    Scribe's Attestation: This note was scribed in the presence of Dr. Darlyn Chamber, MD by Tonna Corner, RN.     Physician Attestation: The scribe's documentation has been prepared under my direction and personally reviewed by me in its entirety.  I confirm that the note above accurately reflects all work, treatment, procedures, and medical decision making performed by me.

## 2022-06-06 ENCOUNTER — Encounter

## 2022-06-06 MED ORDER — TORSEMIDE 10 MG PO TABS
10 MG | ORAL_TABLET | Freq: Every day | ORAL | 2 refills | Status: AC
Start: 2022-06-06 — End: 2022-09-04

## 2022-06-06 NOTE — Telephone Encounter (Signed)
Last Office Visit 03/17/22  Next Office Visit 07/28/22

## 2022-06-11 NOTE — Telephone Encounter (Signed)
From: [name]  To: Dr. Leodis Binet  Sent: 06/10/2022 8:55 PM EDT  Subject: Leron Croak    I went to pick up my prescription that we talked about. The 24 hr nitro medication you wanted me to start taking. They dont have a script for it. I also need a refill on my regular nitroglycerin please.   Thank you so much  [name]

## 2022-06-26 ENCOUNTER — Inpatient Hospital Stay: Admit: 2022-06-26 | Payer: PRIVATE HEALTH INSURANCE | Primary: Sports Medicine

## 2022-06-26 DIAGNOSIS — I350 Nonrheumatic aortic (valve) stenosis: Secondary | ICD-10-CM

## 2022-06-26 DIAGNOSIS — I5032 Chronic diastolic (congestive) heart failure: Secondary | ICD-10-CM

## 2022-06-26 LAB — ECHOCARDIOGRAM COMPLETE 2D W DOPPLER W COLOR: Left Ventricular Ejection Fraction: 58

## 2022-06-30 NOTE — Progress Notes (Signed)
Formatting of this note might be different from the original.  3:07 PM  Pt here for physical and lab results.    3 mo complex scheduled for dyspepsia  Urine collected and sent lab for MicroAlbumin  Electronically signed by Waynetta Sandy, LPN at 29/56/2130  8:65 PM EDT

## 2022-06-30 NOTE — Progress Notes (Signed)
Formatting of this note is different from the original.  Subjective   Subjective:    Rachael Mays is a 66 year old female who presents today to for her yearly physical.     PMH:   Patient Active Problem List    Diagnosis Date Noted   ? Essential hypertension 07/24/2013     Priority: High     Hypertension  - Today's BP:   Vitals:    03/16/22 1555   BP: 122/62   Pulse: 72   Temp: 97.7 F (36.5 C)     - Patient's blood pressure is currently well controlled.  - Current meds:     -She notes that she was previously on lisinopril 20 mg daily.  However, she notes that she had an acute injury with GFR in the 20s reportedly related to iatrogenic causes.  Her blood pressure is well controlled today at 122/62.  Therefore, no specific indications for antihypertensives.  No history of myocardial infarction or heart failure with reduced ejection fraction.  - If patient is able, record daily blood pressures at home prior to medications and bring log to next office visit.   - Discussed sodium restriction, maintaining ideal body weight and regular exercise program as physiologic means to achieve blood pressure control. The patient will strive towards this. It may be appropriate to lower BP with medications, while observing for therapeutic effect and if appropriate later, can discontinue medications if physiologic methods appear to be effective. The patient indicates understanding of these issues and agrees with the plan      ? Obesity 10/22/2008     Priority: High   ? Stage 3a chronic kidney disease (CKD) ([place]) 03/16/2022     Priority: Low     - Establish with Rachael Mays, managing   - Restarted on spironolactone 25 mg daily  -Just finished a course of torsemide x15 days  -Follow-up tomorrow  -Noted trace edema bilaterally today, no crackles    ? Type 2 diabetes mellitus without complication, without long-term current use of insulin ([place]) 03/16/2022     Priority: Low     Diabetes Management:   HEMOGLOBIN A1C   Date Value Ref Range  Status   07/09/2021 7.2 (H) 4.2 - 5.6 % Final   02/13/2021 7.1 (H) 4.2 - 5.6 % Final   - Victoza 1.8 mg daily , previously on Mounjaro with reportedly positive response,but had discontinue to insurance mandate.   Rachael Mays notes that she had an A1c drawn when she saw Rachael Mays in November 2022.  She states that this A1c was 5.1%.  Unfortunately, there is no record in the computer of this.  She is currently on Trulicity and managed by Rachael Mays for her obesity.  We will repeat her blood work prior to her next office visit to discuss further medication therapy.  If her A1c is elevated, would recommend starting on metformin due to her history of coronary artery disease.  - Current Medications:    Blood Pressure:  122/62 [goal <140/90]   10 year ASCVD Risk Score: 9.5 %   Currently prescribed statin medication: Yes   Currently prescribed ACE/ARB: yes    ASA 81 mg daily: Yes   During today's appointment I discussed and educated the patient and what constitutes a low carbohydrate diet and the health benefits of adapting such a diet. They will begin implementing these ideas and we will monitor their weight and co-morbidities.   I discussed with the patient the importance of  exercise, with a goal of at least 30 minutes of exercise, at least 5 times weekly.   Extensive conversation today regarding the importance of checking her feet both visually and tactilely for any signs of skin breakdown. If she notices anything concerning. Rachael Mays will reach out to [place] to be seen/for further guidance.    Smoking Cessation  reports that she has quit smoking. She has never used smokeless tobacco.   Last Eye Exam: No results found for: RETINOPATHY   Discussed the importance of having annual screening for diabetic retinopathy by an eye care professional.       ? Morbidly obese ([place]) 02/18/2021     Priority: Low     - Managed by Rachael Mays   -Trulicity 1.5 mg weekly**  -Topamax 50 mg twice daily  -Status post gastric banding in 2010,  subsequently removed in 2015 secondary to erosion   -Omeprazole 40 mg daily  Obesity  BMI Readings from Last 4 Encounters:   03/16/22 49.32 kg/m   01/22/22 48.19 kg/m   01/22/22 48.19 kg/m   01/16/22 47.79 kg/m     No flowsheet data found.  Wt Readings from Last 3 Encounters:   03/16/22 296 lb 6.4 oz (134.4 kg)   01/22/22 289 lb 9.6 oz (131.4 kg)   01/22/22 289 lb 9.6 oz (131.4 kg)       ? Angina, class III ([place]) 01/31/2021     Priority: Low     - Managed by cardiology  - Infrequent chest pain   - Ranexa 500 mg twice daily  -Nitroglycerin 0.4 mg sublingual as needed, typically uses about once monthly      ? CAD in native artery 01/31/2021     Priority: Low     -Managed by Rachael Mays  -Status post PTCA LAD in April 2017, subsequent cath in 02/2021 demonstrated patent stent  -Increase to rosuvastatin 40 mg daily  -Ranexa 500 mg twice daily   -Nitroglycerin as needed, once every couple months   - ASA 81 mg daily     ? Bilateral leg numbness 01/01/2021     Priority: Low   ? Mixed hyperlipidemia 01/01/2021     Priority: Low     - Increase to Rosuvastatin (Crestor) 40 mg daily  - The 10-year ASCVD risk score (Arnett DK, et al., 2019) is: 9.5%    Values used to calculate the score:      Age: 71 years      Sex: Female      Is Non-Hispanic African American: No      Diabetic: Yes      Tobacco smoker: No      Systolic Blood Pressure: 093 mmHg      Is BP treated: Yes      HDL Cholesterol: 46 mg/dL      Total Cholesterol: 150 mg/dL   - 9.5%  - During today's appointment I discussed and educated the patient and what constitutes a low carbohydrate diet and the health benefits of adapting such a diet. They will begin implementing these ideas and we will monitor their weight and co-morbidities.  - I discussed with the patient the importance of exercise, with a goal of at least 30 minutes of exercise, at least 5 times weekly.      ? Chronic pain disorder 05/05/2017     Priority: Low     - Chronic low back pain with  radiculopathy  -Managed by [facility] pain management  -Oxycodone-acetaminophen 7.5-325 mg every 6 hours  as needed      ? Anxiety and depression 07/15/2016     Priority: Low     - Mood is currently well controlled  - Previously on Lexapro   - Weight management considering Welbutrin, would likely beneficial from a mood standpoint    ? 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 Pulmonary hypertension ([place]) 03/31/2016     Priority: Low   ? Chronic diastolic congestive heart failure ([place]) 03/30/2016     Priority: Low     - Last Echo in 05/2020  The left ventricle is normal in size.  There is normal left ventricular wall thickness.  Left ventricular systolic function is normal. (LVEF>/=55%)  Overall left ventricular ejection fraction is estimated to be 60-65%.  Normal Diastolic Function.  No significant valvular abnormalities identified.    - Recent lower extremity edema  -Managed by nephrology  - Furosemide x15 days, with improvement.  Discontinued yesterday, has noticed some slight increase in swelling.  Has follow-up with nephrology tomorrow.  - Trace pitting bilateral edema to the midshin.  No crackles on examination.    ? Obstructive sleep apnea 09/18/2015     Priority: Low     Formatting of this note might be different from the original.  Updating Deprecated Diagnoses    Last Assessment & Plan:   Formatting of this note might be different from the original.  Reviewed compliance download with pt.  Supplies and parts as needed for her machine.  These are medically necessary.  Continue medications per her PCP and other physicians. Limit caffeine use after 3pm.  Encouraged her to work on weight loss through diet and exercise.  Diagnoses of Pulmonary hypertension, Morbid obesity with body mass index (BMI) of 45.0 to 49.9 in adult [facility]), Coronary artery disease due to lipid rich plaque, and Chronic diastolic congestive heart failure ([place]) were  pertinent to this visit.  The chronic medical conditions listed are directly related to the primary diagnosis listed above.  The management of the primary diagnosis affects the secondary diagnosis and vice versa.    ? Adrenal mass, left ([place]) 01/12/2015     Priority: Low   ? 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 Status post gastric banding 03/26/2010     Priority: Low   ? Backache 01/23/2009     Priority: Low   ? Fibromyalgia 10/22/2008     Priority: Low   ? Spondylosis without myelopathy or radiculopathy, lumbosacral region 10/22/2008     Priority: Low     PSH:   Past Surgical History:   Procedure Laterality Date   ? BREAST BIOPSY     ? BREAST SURGERY      biopsy (negative)   ? CERVICAL SPINE SURGERY     ? CHOLECYSTECTOMY WITH/WITHOUT COMMON DUCT EXPLORATION     ? CORONARY ANGIOPLASTY WITH STENT PLACEMENT  03/2019    stent   ? HYSTERECTOMY     ? LAPAROSCOPIC GASTRIC BANDING      placed 2010, removed 2015     Family Hx:   Family History   Problem Relation Age of Onset   ? Heart Attack Mother    ? Ovarian cancer Mother    ? Hypertension Mother    ? High Cholesterol Mother    ? Thyroid Disease Sister    ? Diabetes Maternal Grandfather    ? Heart Attack  Paternal Grandmother    ? Breast cancer Neg Hx      Allergies:   Allergies   Allergen Reactions   ? Pentazocine Other (See Comments)     dizzy    ? Ibuprofen Unknown   ? Morphine Other (See Comments)   ? Seasonal Ic [Cholestatin] Unknown   ? Talwin [Pentazocine Lactate] Other (See Comments)     hallucinations     Medications:     Current Outpatient Medications:   ?  famotidine (PEPCID) 20 mg TABS, Take 1 tablet by mouth 2 (two) times daily as needed (for Heartburn)., Disp: 180 tablet, Rfl: 1  ?  montelukast (SINGULAIR) 10 MG TABS, Take 1 tablet by mouth daily., Disp: 90 tablet, Rfl: 1  ?  metFORMIN (GLUCOPHAGE-XR) 500 mg extended-release tablet, 1 daily x 1 wk then 2 daily with food, Disp: 60 tablet, Rfl: 3  ?  Insulin Pen Needle (BD PEN NEEDLE  NANO U/F) 32G X 4 MM MISC, 1 shot daily, Disp: 30 each, Rfl: 11  ?  omeprazole (PRILOSEC) 40 MG CPDR, Take 1 capsule by mouth daily., Disp: 30 capsule, Rfl: 2  ?  spironolactone (ALDACTONE) 25 MG TABS, Take 25 mg by mouth daily., Disp: , Rfl:   ?  rosuvastatin (CRESTOR) 40 MG TABS, Take 1 tablet by mouth nightly., Disp: 90 tablet, Rfl: 0  ?  lisinopril (PRINIVIL,ZESTRIL) 20 mg tablet, Take 20 mg by mouth daily., Disp: , Rfl:   ?  topiramate (TOPAMAX) 50 MG TABS, Take 1 tablet by mouth 2 (two) times daily., Disp: 60 tablet, Rfl: 2  ?  Magnesium 250 MG TABS, Take 350 mg by mouth., Disp: , Rfl:   ?  ranolazine (RANEXA) 500 MG TB12, Take 1 tablet by mouth 2 (two) times daily., Disp: 60 tablet, Rfl: 1  ?  diclofenac (VOLTAREN GEL) 1 % GEL, Apply 4 g topically 4 (four) times daily as needed for Pain., Disp: , Rfl:   ?  triamcinolone (KENALOG) 0.1% cream, Apply  topically 2 (two) times a day., Disp: , Rfl:   ?  oxycodone-acetaminophen (PERCOCET) 7.5-325 MG TABS, Take 1 tablet by mouth every 6 (six) hours as needed (pain)., Disp: , Rfl:   ?  cetirizine (ZYRTEC) 10 MG TABS, Take 10 mg by mouth daily., Disp: , Rfl:   ?  aspirin 81 MG TBEC, Take 81 mg by mouth daily., Disp: , Rfl:   ?  Coenzyme Q10 (COQ10) 200 MG CAPS, Take 200 mg by mouth daily., Disp: , Rfl:   ?  Calcium Citrate-Vitamin D 315-250 MG-UNIT TABS, Take 2 tablets by mouth 2 (two) times daily., Disp: , Rfl:   ?  nitroGLYCERIN (NITROSTAT) 0.4 MG SUBL, 1 tablet by Sublingual route every 5 (five) minutes as needed., Disp: 25 tablet, Rfl: 2  Vaccinations: Patient reports that they are up to date on childhood immunizations. Last TDAP: 10/2019.   Covid 19 Vaccine: Covid-19 primary series and at least 1 booster, but has not received the Bivalent Omicron booster    Tobacco:   Social History     Tobacco Use   Smoking Status Former   Smokeless Tobacco Never   Tobacco Comments    quit 2013     Alcohol:    reports that she does not currently use alcohol.    Recreational  Substances:    reports no history of drug use.    Sexual Hx  History   Sexual Activity   ? Sexual activity: Not on file  Vitals:    06/30/22 1508   BP: 122/56   Pulse: 85   Temp: 97.4 F (36.3 C)     Health Maintenance   Topic Date Due   ? Eye Exam  Never done   ? PAP Screening  Never done   ? Colonoscopy  Never done   ? COVID-19 Vaccine (4 - Moderna series) 04/30/2021   ? Foot Exam  12/07/2021   ? Influenza Vaccine (1) 08/07/2022   ? Mammogram Screening (Annual)  08/25/2022   ? HB A1C  12/26/2022   ? CHOLESTEROL DIABETIC  06/26/2023   ? GFR  06/26/2023   ? Microalb/Creat Ratio  07/01/2023   ? Pneumococcal 0-64 (3 - PPSV23 if available, else PCV20) 07/12/2024   ? DTap,Tdap,and Td (3 - Td or Tdap) 10/15/2029   ? Shingrix  Completed   ? Hepatitis C Screening  Completed   ? HPV  Aged Out   ? Meningococcal conjugate valent 4 (MCV4)  Aged Out     ________________________________________        Past Medical History:   Diagnosis Date   ? Congestive heart failure (CHF) ([place])    ? Coronary atherosclerosis    ? DVT (deep venous thrombosis) ([place])    ? Esophageal reflux    ? Hyperlipidemia    ? Hypertension      Past Surgical History:   Procedure Laterality Date   ? BREAST BIOPSY     ? BREAST SURGERY      biopsy (negative)   ? CERVICAL SPINE SURGERY     ? CHOLECYSTECTOMY WITH/WITHOUT COMMON DUCT EXPLORATION     ? CORONARY ANGIOPLASTY WITH STENT PLACEMENT  03/2019    stent   ? HYSTERECTOMY     ? LAPAROSCOPIC GASTRIC BANDING      placed 2010, removed 2015       History   Alcohol Use Not Currently     Comment: on rare occasion     Social History     Tobacco Use   Smoking Status Former   Smokeless Tobacco Never   Tobacco Comments    quit 2013     Counseling given: Not Answered  Tobacco comments: quit 2013    Family History   Problem Relation Age of Onset   ? Heart Attack Mother    ? Ovarian cancer Mother    ? Hypertension Mother    ? High Cholesterol Mother    ? Thyroid Disease Sister    ? Diabetes Maternal Grandfather    ?  Heart Attack Paternal Grandmother    ? Breast cancer Neg Hx      Allergies   Allergen Reactions   ? Pentazocine Other (See Comments)     dizzy    ? Ibuprofen Unknown   ? Seasonal Ic [Cholestatin] Unknown   ? Talwin [Pentazocine Lactate] Other (See Comments)     hallucinations     Outpatient Medications Marked as Taking for the 06/30/22 encounter (Office Visit) with Myer Peer, DO   Medication Sig Dispense Refill   ? famotidine (PEPCID) 20 mg TABS Take 1 tablet by mouth 2 (two) times daily as needed (for Heartburn). 180 tablet 1   ? montelukast (SINGULAIR) 10 MG TABS Take 1 tablet by mouth daily. 90 tablet 1   ? metFORMIN (GLUCOPHAGE-XR) 500 mg extended-release tablet 1 daily x 1 wk then 2 daily with food 60 tablet 3   ? Insulin Pen Needle (BD PEN NEEDLE NANO U/F) 32G X 4 MM MISC  1 shot daily 30 each 11   ? omeprazole (PRILOSEC) 40 MG CPDR Take 1 capsule by mouth daily. 30 capsule 2   ? spironolactone (ALDACTONE) 25 MG TABS Take 25 mg by mouth daily.     ? rosuvastatin (CRESTOR) 40 MG TABS Take 1 tablet by mouth nightly. 90 tablet 0   ? lisinopril (PRINIVIL,ZESTRIL) 20 mg tablet Take 20 mg by mouth daily.     ? topiramate (TOPAMAX) 50 MG TABS Take 1 tablet by mouth 2 (two) times daily. 60 tablet 2   ? Magnesium 250 MG TABS Take 350 mg by mouth.     ? ranolazine (RANEXA) 500 MG TB12 Take 1 tablet by mouth 2 (two) times daily. 60 tablet 1   ? diclofenac (VOLTAREN GEL) 1 % GEL Apply 4 g topically 4 (four) times daily as needed for Pain.     ? triamcinolone (KENALOG) 0.1% cream Apply  topically 2 (two) times a day.     ? oxycodone-acetaminophen (PERCOCET) 7.5-325 MG TABS Take 1 tablet by mouth every 6 (six) hours as needed (pain).     ? cetirizine (ZYRTEC) 10 MG TABS Take 10 mg by mouth daily.     ? aspirin 81 MG TBEC Take 81 mg by mouth daily.     ? Coenzyme Q10 (COQ10) 200 MG CAPS Take 200 mg by mouth daily.     ? Calcium Citrate-Vitamin D 315-250 MG-UNIT TABS Take 2 tablets by mouth 2 (two) times daily.     ?  nitroGLYCERIN (NITROSTAT) 0.4 MG SUBL 1 tablet by Sublingual route every 5 (five) minutes as needed. 25 tablet 2     Review of Systems   Constitutional: Negative for appetite change, chills, fatigue, fever and unexpected weight change.   HENT: Negative for ear pain, rhinorrhea, sinus pressure and sore throat.    Eyes: Negative for pain and visual disturbance.   Respiratory: Negative for cough and shortness of breath.    Cardiovascular: Negative for chest pain, palpitations and leg swelling.   Gastrointestinal: Positive for abdominal pain (reflux type symptoms .). Negative for abdominal distention, constipation, diarrhea and nausea.   Endocrine: Negative for polydipsia and polyuria.   Genitourinary: Negative for dysuria and flank pain.   Musculoskeletal: Positive for back pain. Negative for arthralgias and myalgias.   Skin: Negative for color change and rash.   Allergic/Immunologic: Negative for environmental allergies and food allergies.   Neurological: Positive for numbness. Negative for dizziness, seizures, syncope and headaches.   Psychiatric/Behavioral: Negative for behavioral problems, dysphoric mood and suicidal ideas.     Vitals:    06/30/22 1508   BP: 122/56   Pulse: 85   Temp: 97.4 F (36.3 C)   TempSrc: Temporal   Weight: 295 lb 3.2 oz (133.9 kg)   Height: 65" (165.1 cm)     Body mass index is 49.12 kg/m.         ________________________________________    Objective   Objective:  Physical Exam  Constitutional:       General: She is not in acute distress.     Appearance: She is well-developed. She is not diaphoretic.   HENT:      Head: Normocephalic and atraumatic.   Eyes:      Conjunctiva/sclera: Conjunctivae normal.      Pupils: Pupils are equal, round, and reactive to light.   Neck:      Thyroid: No thyroid mass or thyroid tenderness.   Cardiovascular:      Rate and Rhythm:  Normal rate and regular rhythm.      Heart sounds: Normal heart sounds. No murmur heard.     No friction rub. No gallop.    Pulmonary:      Effort: Pulmonary effort is normal.      Breath sounds: Normal breath sounds. No wheezing or rales.   Abdominal:      General: Bowel sounds are normal. There is no distension.      Palpations: Abdomen is soft.      Tenderness: There is no abdominal tenderness.   Musculoskeletal:         General: Normal range of motion.      Cervical back: Normal range of motion.      Right lower leg: No edema.      Left lower leg: No edema.   Skin:     General: Skin is warm and dry.   Neurological:      Mental Status: She is alert and oriented to person, place, and time.   Psychiatric:         Mood and Affect: Mood normal.         Behavior: Behavior normal.     Procedures    Chronic Disease Management    ________________________________________    Assessment & Plan:    CRISELDA STARKE is a 66 year old female who presents today to for her yearly physical.     Diagnoses and all orders for this visit:    1. Routine adult health maintenance (Primary)    2. Essential hypertension  Overview:  Hypertension  - Today's BP:   Vitals:    06/30/22 1508   BP: 122/56   Pulse: 85   Temp: 97.4 F (36.3 C)     - Patient's blood pressure is currently well controlled.  - Current meds:     -She notes that she was previously on lisinopril 20 mg daily.  However, she notes that she had an acute injury with GFR in the 20s reportedly related to iatrogenic causes.  Her blood pressure is well controlled today at 122/62.  Therefore, no specific indications for antihypertensives.  No history of myocardial infarction or heart failure with reduced ejection fraction.  - If patient is able, record daily blood pressures at home prior to medications and bring log to next office visit.   - Discussed sodium restriction, maintaining ideal body weight and regular exercise program as physiologic means to achieve blood pressure control. The patient will strive towards this. It may be appropriate to lower BP with medications, while observing for  therapeutic effect and if appropriate later, can discontinue medications if physiologic methods appear to be effective. The patient indicates understanding of these issues and agrees with the plan    3. Stage 3a chronic kidney disease (CKD) (HCC)  Overview:  - Establish with Rachael Mays, managing   - Spironolactone 25 mg daily  - Control BP   - Avoid NSAIDs   - Discussed need to follow up given persistence in progression of renal decline   ESTIMATED GFR   Date Value Ref Range Status   06/25/2022 36 (L) >=60 mL/min/1.73 m2 Final     Comment:     (NOTE)  Estimated GFR was calculated using the CKD-EPIcr (2021) equation  refit without race. The equation is recommended by the [place] [facility] [employer].    01/16/2022 27 (L) >=60 mL/min/1.73 m2 Final     Comment:     (  NOTE)  Estimated GFR was calculated using the CKD-EPIcr (2021) equation  refit without race. The equation is recommended by the [place] [facility] [employer].  Tested at: [employer], [address] [address]    98/10/9146 31 (L) >=60 mL/min/1.73 m2 Final     Comment:     (NOTE)  Estimated GFR was calculated using the CKD-EPIcr (2021) equation  refit without race. The equation is recommended by the [place] [facility] [employer].      4. Type 2 diabetes mellitus without complication, without long-term current use of insulin (HCC)  -     MICROALBUMIN, URINE RANDOM (INCLUDES MALB/CREAT RATIO); Future    5. Morbidly obese ([place])  Overview:  -Previously managed by Dr. Christie Nottingham, but he is reportedly leaving the practice, therefore she is establishing with a new weight management physician.  -Previously had good results with Mounjaro, but due to insurance Mays had to be discontinued.  Currently on Victoza without significant results.  Previously has been on Trulicity.  -Topamax 50 mg twice daily  -Status post gastric banding in  2010, subsequently removed in 2015 secondary to erosion   -Omeprazole 40 mg daily  Obesity  BMI Readings from Last 4 Encounters:   06/30/22 49.12 kg/m   05/07/22 49.29 kg/m   05/07/22 49.29 kg/m   03/16/22 49.32 kg/m     No flowsheet data found.  Wt Readings from Last 3 Encounters:   06/30/22 295 lb 3.2 oz (133.9 kg)   05/07/22 296 lb 3.2 oz (134.4 kg)   05/07/22 296 lb 3.2 oz (134.4 kg)     6. Angina, class III ([place])  Overview:  - Managed by cardiology  - Infrequent chest pain   - Ranexa 500 mg twice daily  -Nitroglycerin 0.4 mg sublingual as needed, typically uses about once monthly    7. Bilateral leg numbness    8. Mixed hyperlipidemia  Overview:  - Increase to Rosuvastatin (Crestor) 40 mg daily  - The 10-year ASCVD risk score (Arnett DK, et al., 2019) is: 9.5%    Values used to calculate the score:      Age: 69 years      Sex: Female      Is Non-Hispanic African American: No      Diabetic: Yes      Tobacco smoker: No      Systolic Blood Pressure: 829 mmHg      Is BP treated: Yes      HDL Cholesterol: 46 mg/dL      Total Cholesterol: 150 mg/dL   - 9.5%  - During today's appointment I discussed and educated the patient and what constitutes a low carbohydrate diet and the health benefits of adapting such a diet. They will begin implementing these ideas and we will monitor their weight and co-morbidities.  - I discussed with the patient the importance of exercise, with a goal of at least 30 minutes of exercise, at least 5 times weekly.    9. Chronic pain disorder  Overview:  - Chronic low back pain with radiculopathy  -Managed by [facility] pain management  -Oxycodone-acetaminophen 7.5-325 mg every 6 hours as needed    10. Pulmonary hypertension ([place])    11. Chronic diastolic congestive heart failure (HCC)  Overview:  - Last Echo in 05/2020  The left ventricle is normal in size.  There is normal left ventricular wall thickness.  Left ventricular systolic function is  normal. (LVEF>/=55%)  Overall left ventricular  ejection fraction is estimated to be 60-65%.  Normal Diastolic Function.  No significant valvular abnormalities identified.    - Currently well controlled  - No significant LEE on exam   - Denies dyspnea  - Continue to monitor  - BP controlled     12. Status post gastric banding    13. Dyspepsia  Overview:  - Continue omeprazole 40 mg daily  -Start Pepcid 20 mg twice daily as needed  -Follow-up, if not improving, consider H. pylori testing versus referral to GI due to history of gastric banding erosion    Orders:  -     OFFICE/OUTPT VISIT,EST,LEVL IV    14. Anxiety and depression  Overview:  - Currently well controlled  - No longer taking lexapro or Wellbutrinx   - Continue to monitor     17. Type 2 diabetes mellitus with diabetic polyneuropathy, without long-term current use of insulin (HCC)  Overview:  Diabetes Management:   HEMOGLOBIN A1C   Date Value Ref Range Status   06/25/2022 5.7 (H) 4.2 - 5.6 % Final   07/09/2021 7.2 (H) 4.2 - 5.6 % Final   02/13/2021 7.1 (H) 4.2 - 5.6 % Final     - Most current Hemoglobin A1c demonstrates stable diabetes control. Therefore, I have recommended the following medication management.   - Current Medications:      - Continue Liraglutide (Victoza) 1.8 mg daily per weight management    Blood Pressure:  122/56 [goal <140/90]   10 year ASCVD Risk Score: 8.1 %   Currently prescribed statin medication: Yes   Currently prescribed ACE/ARB: no    ASA 81 mg daily: Yes   During today's appointment I discussed and educated the patient and what constitutes a low carbohydrate diet and the health benefits of adapting such a diet. They will begin implementing these ideas and we will monitor their weight and co-morbidities.   I discussed with the patient the importance of exercise, with a goal of at least 30 minutes of exercise, at least 5 times weekly.   Extensive conversation today regarding the importance of checking her feet both visually and tactilely for any signs of skin  breakdown. If she notices anything concerning. Dayanna will reach out to [place] to be seen/for further guidance.    Smoking Cessation  reports that she has quit smoking. She has never used smokeless tobacco.   Last Eye Exam: No results found for: "RETINOPATHY"   Discussed the importance of having annual screening for diabetic retinopathy by an eye care professional.     16. History of melanoma  Overview:  - History of superficial melanoma  -Status post resection by Dr. Caryn Section on 12/29/2017  Jearld Shines with dermatology every 6 months    Other orders  -     famotidine (PEPCID) 20 mg TABS; Take 1 tablet by mouth 2 (two) times daily as needed (for Heartburn).  Dispense: 180 tablet; Refill: 1    - Onda  completed the below blood work prior to the office visit. Those results were reviewed with the patient in detail. Please see the result note and office visit assessment and plan, if appropriate, for further discussion.     Specimen on 06/30/2022   Component Date Value Ref Range Status   ? MICROALBUMIN, UR 06/30/2022 <12.0  mg/L Final   ? CREATININE, URINE RANDOM 06/30/2022 76.6  mg/dL Final   ? MICROALB/CREAT RATIO 06/30/2022 See Comment  mg/g CREAT Final   Specimen  on 06/25/2022   Component Date Value Ref Range Status   ? WBC 06/25/2022 8.5  3.7 - 10.3 x10(3)/mcL Final   ? RBC 06/25/2022 4.43  3.90 - 5.20 x10(6)/mcL Final   ? HEMOGLOBIN 06/25/2022 12.4  11.2 - 15.7 g/dL Final   ? HEMATOCRIT 06/25/2022 41.1  34.0 - 45.0 % Final   ? MCV 06/25/2022 92.8  80.0 - 100.0 fL Final   ? MCH 06/25/2022 28.0  26.0 - 34.0 pg Final   ? MCHC 06/25/2022 30.2 (L)  30.7 - 35.5 g/dL Final   ? RDW 06/25/2022 15.3 (H)  <=14.9 % Final   ? PLATELET 06/25/2022 201  155 - 369 x10(3)/mcL Final   ? MPV 06/25/2022 11.2  8.8 - 12.5 fL Final   ? SODIUM 06/25/2022 139  136 - 145 mEq/L Final   ? POTASSIUM 06/25/2022 4.5  3.5 - 5.0 mEq/L Final   ? CHLORIDE 06/25/2022 104  98 - 107 mEq/L Final   ? CO2 06/25/2022 24  22 - 29 mmol/L Final   ? GLUCOSE, RANDOM  06/25/2022 106 (H)  82 - 100 mg/dL Final   ? BLD UREA NITROGEN 06/25/2022 21  8 - 23 mg/dL Final   ? CREATININE 06/25/2022 1.59 (H)  0.51 - 1.30 mg/dL Final   ? CALCIUM 06/25/2022 9.9  8.8 - 10.4 mg/dL Final   ? TOTAL PROTEIN 06/25/2022 6.9  6.4 - 8.3 gm/dL Final   ? ALBUMIN 06/25/2022 4.4  3.2 - 4.6 gm/dL Final   ? TOTAL BILIRUBIN 06/25/2022 0.4  0.2 - 1.3 mg/dL Final   ? ALK PHOSPHATASE 06/25/2022 88  36 - 123 U/L Final   ? AST 06/25/2022 18  <=40 U/L Final   ? ALT 06/25/2022 16  <=41 U/L Final   ? ESTIMATED GFR 06/25/2022 36 (L)  >=60 mL/min/1.73 m2 Final   ? ANION GAP 06/25/2022 11  7 - 16 mmol/L Final   ? CHOLESTEROL 06/25/2022 144  <200 mg/dL Final   ? TRIGLYCERIDE 06/25/2022 111  <150 mg/dL Final   ? HDL CHOLESTEROL 06/25/2022 61  >=40 mg/dL Final   ? LDL CHOLESTEROL (CALCULATED) 06/25/2022 63  <100 mg/dL Final   ? TOTAL NON HDL CHOL 06/25/2022 83  <=129 mg/dL Final   ? HEMOGLOBIN A1C 06/25/2022 5.7 (H)  4.2 - 5.6 % Final   ? EST AVERAGE GLUCOSE 06/25/2022 117  mg/dL Final   ? VITAMIN D 25 [place] 06/25/2022 56.7  30.0 - 150.0 ng/mL Final     Return in about 3 months (around 09/30/2022) for Dyspepsia.     Myer Peer, [place]  [facility]  Sports Medicine  06/30/2022    Please note that some or all of this record was generated using voice recognition software. If there are any questions about the content of this document, please contact the author as some errors in transcription may have occurred.    Electronically signed by Myer Peer, DO at 07/01/2022  6:21 PM EDT

## 2022-07-20 NOTE — Telephone Encounter (Signed)
Pt dropped off info for her placard.  There was not enough info given the last time per the DMV.  Paper is in [employer]  Please call pt when complete and she will come pick it up.  Thank you.

## 2022-07-21 NOTE — Telephone Encounter (Signed)
She would need to get this from her PCP office.

## 2022-07-21 NOTE — Telephone Encounter (Signed)
Paperwork received. Will re do letter once [facility] returns

## 2022-07-21 NOTE — Telephone Encounter (Signed)
Called pt, no answer, lmom to call us back

## 2022-07-21 NOTE — Telephone Encounter (Signed)
Spoke with patient on her bringing the papers yesterday knowing that Dr. Susy Manor will be signing them. And will inform her once they are signed to be picked up.  tj

## 2022-07-28 ENCOUNTER — Ambulatory Visit
Admit: 2022-07-28 | Discharge: 2022-07-28 | Payer: PRIVATE HEALTH INSURANCE | Attending: Internal Medicine | Primary: Sports Medicine

## 2022-07-28 DIAGNOSIS — E278 Other specified disorders of adrenal gland: Secondary | ICD-10-CM

## 2022-07-28 NOTE — Progress Notes (Unsigned)
Office : [phone]     Fax :[phone]      Nephrology progress Note        History of Present Illness:    This is a 66 y.o. female who presents to the office for evaluation of  worsening renal function .  H/o DM 2 - none   H/o HTN - now BP better after loss of weight   H/o Renal stones - none   H/o renal disease in family   H/o CAD - h/o Stent in past. H/o CHF   CHF?     H/o smoking .    Today visit 07/28/22    Recently stopped lisinopril and spironolactone    Cr 1.6 7/23<--1.4 4/23<--1. 3/23<--1.3<--2.0 (2/23) <--1.8 1/23<--1.4 8/22<--1.1 3/22<-- normal 2021 <--base line 1.0-1.1     GFR 36  Lytes ok   UACR - neg 7/23  A1C 5.7- Metformin   HCV neg   CT ABD 5/23: KIDNEYS/URETERS:  Right renal cyst.  No hydronephrosis, stone, or suspicious mass   S/p CTPA 2/22  Echo 7/23:    systolic function with an estimated ejection fraction of 55-60%.   No regional wall motion abnormalities are seen.   Normal diastolic function.E/e"=11.35.    BP here 135/80  No usage of NSAIDS   Diuretcis dose was reduced and Lisinopril stopped     Pt denies CP/SOB/palpitations/abdominal pain/N/V.   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    Depression     GERD (gastroesophageal reflux disease)     NO LONGER SINCE LAP    GERD (gastroesophageal reflux disease) 01/23/2009    Hx of blood clots     Hyperlipidemia     hx; resolved with lap band    Hypertension     Idiopathic peripheral neuropathy 03/26/2017    Melanoma (HCC)     upper back    MRSA (methicillin resistant staph aureus) culture positive 10/17/2019    leg abscess     Obesity     hx of; had lap band    Obstructive sleep apnea 09/18/2015    uses bi-pap    Obstructive sleep apnea (adult) (pediatric) 09/18/2015    Pulmonary embolism (HCC)     Stenosis of right carotid artery 06/16/2021    Type 2 diabetes mellitus with diabetic polyneuropathy, without long-term current use of insulin (HCC) 01/26/2022       Past Surgical History:        Procedure Laterality Date    BACK SURGERY      neck  plates    BREAST SURGERY      left lumpectomy    CERVICAL FUSION      CESAREAN SECTION      CHOLECYSTECTOMY      COLONOSCOPY      COLONOSCOPY  04/08/2010    COLONOSCOPY N/A 04/18/2021    COLONOSCOPY DIAGNOSTIC performed by Kandice Robinsons, MD at [facility] ENDOSCOPY    CORONARY  ANGIOPLASTY      DIAGNOSTIC CARDIAC CATH LAB PROCEDURE      DILATATION, ESOPHAGUS      ENDOSCOPY, COLON, DIAGNOSTIC      HYSTERECTOMY (CERVIX STATUS UNKNOWN)      HYSTERECTOMY, VAGINAL      LAP BAND  05/01/2008    Dr. Suzi Roots    OTHER SURGICAL HISTORY      [place] Filter: curently in place    OTHER SURGICAL HISTORY  07/05/2013    lap band removal    SHOULDER SURGERY      SKIN CANCER EXCISION  12/29/2017    UPPER GASTROINTESTINAL ENDOSCOPY  05/19/2013    UPPER GASTROINTESTINAL ENDOSCOPY  07/21/2013    ESOPHAGEAL STENT PLACEMENT    UPPER GASTROINTESTINAL ENDOSCOPY N/A 07/31/2014    Esophagogastroduodenoscopy with esophageal balloon dilation    UPPER GASTROINTESTINAL ENDOSCOPY N/A 04/18/2021    EGD DILATION BALLOON performed by Kandice Robinsons, MD at [facility] ENDOSCOPY    VASCULAR SURGERY  04/2015    Donzetta Sprung, celiac artery angiogram, normal abd arteries       Current Medications:    Current Outpatient Medications   Medication Sig Dispense Refill    gabapentin (NEURONTIN) 100 MG capsule Take 1 capsule by mouth nightly.      metFORMIN (GLUCOPHAGE) 500 MG tablet Take 1 tablet by mouth 2 times daily (with meals)      torsemide (DEMADEX) 10 MG tablet Take 1 tablet by mouth daily 90 tablet 2    isosorbide mononitrate (IMDUR) 30 MG  extended release tablet Take 1 tablet by mouth daily 90 tablet 3    nitroGLYCERIN (NITROSTAT) 0.4 MG SL tablet up to max of 3 total doses. If no relief after 1 dose, call 911. 25 tablet 3    ranolazine (RANEXA) 500 MG extended release tablet TAKE ONE TABLET BY MOUTH TWICE A DAY 180 tablet 3    fluocinonide (LIDEX) 0.05 % external solution APPLY SPARINGLY TO THE BACK OF THE SCALP DAILY AS NEEDED FOR ITCHING 60 mL 1    clobetasol (TEMOVATE) 0.05 % cream Apply to affected area twice daily for up to 2 weeks or until improved. 60 g 1    dilTIAZem (CARDIZEM CD) 180 MG extended release capsule TAKE ONE CAPSULE BY MOUTH DAILY 90 capsule 3    omeprazole (PRILOSEC) 40 MG delayed release capsule TAKE ONE CAPSULE BY MOUTH DAILY 30 capsule 0    triamcinolone (KENALOG) 0.1 % cream Apply to affected area twice daily for up to 2 weeks or until improved. 15 g 2    topiramate (TOPAMAX) 25 MG tablet Take 2 tablets by mouth 2 times daily      montelukast (SINGULAIR) 10 MG tablet TAKE ONE TABLET BY MOUTH DAILY. **MUST CALL MD FOR APPOINTMENT 30 tablet 5    oxyCODONE-acetaminophen (PERCOCET) 7.5-325 MG per tablet as needed.      rosuvastatin (CRESTOR) 20 MG tablet daily      magnesium (MAGNESIUM-OXIDE) 250 MG TABS tablet Take 350 mg by mouth daily      Coenzyme Q10 (CO Q10) 200 MG CAPS Take by mouth daily (with breakfast)      calcium citrate-vitamin D (CITRICAL + D) 315-250 MG-UNIT TABS per tablet Take 2 tablets by mouth 2 times daily      diclofenac sodium (VOLTAREN) 1 % GEL APPLY 4 GRAMS TO AFFECTED AREA(S) TWO TIMES A DAY 100 g 1    Cholecalciferol (VITAMIN D3) 50 MCG (2000 UT) CAPS Take 1 capsule by mouth daily 90 capsule  3    albuterol sulfate HFA (PROVENTIL HFA) 108 (90 Base) MCG/ACT inhaler Inhale 2 puffs into the lungs every 6 hours as needed for Wheezing 3 Inhaler 3    lidocaine (LIDODERM) 5 % Place 1 patch onto the skin daily 12 hours on, 12 hours off. (Patient taking differently: Place 1 patch onto the skin as needed 12 hours  on, 12 hours off.) 30 patch 0    aspirin 81 MG EC tablet Take 1 tablet by mouth daily 30 tablet 11    Multiple Vitamins-Minerals (MULTIVITAMIN PO) Take 1 tablet by mouth daily       Tirzepatide (MOUNJARO) 5 MG/0.5ML SOPN SC injection once a week (Patient not taking: Reported on 06/03/2022)       No current facility-administered medications for this visit.       Allergies:  Ibuprofen, Lisinopril, Seasonal, and Talwin [pentazocine]    Social History:   Social History     Socioeconomic History    Marital status: Divorced     Spouse name: [name]    Number of children: 2    Years of education: 12    Highest education level: Not on file   Occupational History    Not on file   Tobacco Use    Smoking status: Former     Packs/day: 0.50     Years: 40.00     Pack years: 20.00     Types: Cigarettes     Quit date: 08/07/2012     Years since quitting: 9.9    Smokeless tobacco: Former     Quit date: 06/16/2013    Tobacco comments:     started to smoke at age 17 / only smoked 0.5 p.p.d    Vaping Use    Vaping Use: Never used   Substance and Sexual Activity    Alcohol use: No     Alcohol/week: 0.0 standard drinks    Drug use: No    Sexual activity: Not Currently   Other Topics Concern    Not on file   Social History Narrative    Works as Conservation officer, nature at [employer], prison and probation services Strain: Not on file   Food Insecurity: Not on file   Transportation Needs: Not on file   Physical Activity: Not on file   Stress: Not on file   Social Connections: Not on file   Intimate Partner Violence: Not on file   Housing Stability: Not on file       Family History:   Family History   Problem Relation Age of Onset    Arthritis Mother     Cancer Mother     Depression Mother     High Blood Pressure Mother     Heart Disease Mother 72        MI     Ovarian Cancer Mother 58    Cancer Father 37        colon    Heart Disease Maternal Grandmother     Early Death Paternal Grandmother     Heart Disease Paternal Grandfather      Diabetes Other     High Blood Pressure Other     Obesity Other     Other Sister         OSA    Other Brother         OSA    Other Brother  OSA    Other Daughter         OSA       Review of Systems:    Constitutional: No fever, no chills, no lethargy, no weakness.  HEENT:  No headache, otalgia, itchy eyes, nasal discharge or sore throat.  Cardiac:  No chest pain, dyspnea, orthopnea or PND.  Chest:  No cough, phlegm or wheezing.  Abdomen:  No abdominal pain, nausea or vomiting.  Neuro:  No focal weakness, abnormal movements orseizure like activity.  Skin:   No rashes, no itching.  GU:   No hematuria, no pyuria, no dysuria, no flank pain.  Extremities:  No swelling       Objective:  BP 135/80   Pulse 64   Wt (!) 301 lb (136.5 kg)   BMI 50.09 kg/m     Physical Exam:  General appearance:Awake, alert, in no acute distress  Skin: warm and dry, no rash or erythema    Eyes: conjunctivae normal and sclera anicteric    ENT: :no thrush no pharyngeal congestion      Neck: without any JVD. No carotid bruits or thyromegaly.      Pulmonary: b/l rales     Cardiovascular:Normal S1 & S2, No S3 or  S4, No  Pericardial rub , No Murmur     Abdomen: soft nontender, bowel sounds present, no organomegaly,  No ascites      Extremities: no cyanosis, +1-2 b/l pitting edema    Labs:    CBC:   Lab Results   Component Value Date/Time    WBC 8.3 10/14/2021 08:40 AM    RBC 4.16 10/14/2021 08:40 AM    HGB 12.3 10/14/2021 08:40 AM    HCT 37.1 10/14/2021 08:40 AM    MCV 89.2 10/14/2021 08:40 AM    RDW 14.0 10/14/2021 08:40 AM    PLT 218 10/14/2021 08:40 AM    MPV 8.4 10/14/2021 08:40 AM      BMP:   Lab Results   Component Value Date/Time    NA 143 03/12/2022 01:40 PM    NA 145 02/27/2022 03:32 PM    NA 143 02/18/2022 04:20 PM    K 4.7 03/12/2022 01:40 PM    K 4.5 02/27/2022 03:32 PM    K 4.1 02/18/2022 04:20 PM    K 4.6 10/17/2019 06:07 AM    K 3.9 05/05/2017 03:21 AM    CL 107 03/12/2022 01:40 PM    CL 108 02/27/2022 03:32 PM    CL 107  02/18/2022 04:20 PM    CO2 21 03/12/2022 01:40 PM    CO2 24 02/27/2022 03:32 PM    CO2 25 02/18/2022 04:20 PM    BUN 18 03/12/2022 01:40 PM    BUN 20 02/27/2022 03:32 PM    BUN 17 02/18/2022 04:20 PM    CREATININE 1.4 03/12/2022 01:40 PM    CREATININE 1.4 02/27/2022 03:32 PM    CREATININE 1.3 02/18/2022 04:20 PM    GLUCOSE 129 03/12/2022 01:40 PM    GLUCOSE 123 02/27/2022 03:32 PM    GLUCOSE 101 02/18/2022 04:20 PM    CALCIUM 9.5 03/12/2022 01:40 PM    CALCIUM 9.9 02/27/2022 03:32 PM    CALCIUM 9.6 02/18/2022 04:20 PM      BNP:No results found for: BNP  PHOSPHORUS:    Lab Results   Component Value Date/Time    PHOS 3.8 08/21/2013 03:46 PM    PHOS 3.3 08/18/2013 08:13 AM    PHOS 3.5 08/17/2013 06:49  AM     MAGNESIUM:   Lab Results   Component Value Date/Time    MG 2.20 05/07/2017 04:45 AM     ALBUMIN:   Lab Results   Component Value Date/Time    LABALBU 4.0 02/18/2022 04:30 PM     IRON:    Lab Results   Component Value Date/Time    IRON 93 03/26/2010 05:35 PM     IRON SATURATION:    Lab Results   Component Value Date/Time    LABIRON 24 03/26/2010 05:35 PM     TIBC:    Lab Results   Component Value Date/Time    TIBC 397 03/26/2010 05:35 PM     FERRITIN:    Lab Results   Component Value Date/Time    FERRITIN 61.9 05/02/2009 05:40 PM     ANA:   Lab Results   Component Value Date    ANA Negative 07/26/2019       SPEP:   Lab Results   Component Value Date/Time    PROT 6.8 02/18/2022 04:30 PM    PROT 7.3 03/31/2011 03:07 PM    LABALPH 0.2 03/29/2017 03:33 PM    LABALPH 0.7 03/29/2017 03:33 PM    GAMGLOB 1.2 03/29/2017 03:33 PM     UPEP: No results found for: TPU   HEPBSAG:No results found for: HEPBSAG  HEPCAB:No results found for: HEPCAB  C3: No results found for: C3  C4: No results found for: C4  MPO ANCA: No results found for: MPO .  PR3 ANCA:  No results found for: PR3  PTH: No results found for: PTH    Urine Creatinine:    Lab Results   Component Value Date/Time    LABCREA 241.1 02/13/2021 03:04 PM     Urine  Eosinophils: No results found for: UREO  Urine Protein:  No results found for: TPU  Urinalysis:  U/A:   Lab Results   Component Value Date/Time    NITRU NEGATIVE 02/13/2021 03:03 PM    COLORU Yellow 02/13/2021 03:03 PM    PHUR 5.5 11/25/2017 04:24 PM    LABCAST 0-1 Hyaline 07/09/2013 02:09 AM    WBCUA 4 07/16/2014 07:26 PM    RBCUA NEGATIVE 02/13/2021 03:03 PM    MUCUS PRESENT 02/13/2021 03:03 PM    YEAST 0 11/25/2017 04:24 PM    BACTERIA 0 11/25/2017 04:24 PM    BACTERIA 2+ 07/16/2014 07:26 PM    CLARITYU Clear 02/13/2021 03:03 PM    SPECGRAV 1.025 02/13/2021 03:03 PM    LEUKOCYTESUR NEGATIVE 02/13/2021 03:03 PM    LEUKOCYTESUR 0 to 5 02/13/2021 03:03 PM    UROBILINOGEN 2+ (4-6 mg/dL) 95/62/1308 65:78 PM    BILIRUBINUR NEGATIVE 02/13/2021 03:03 PM    BLOODU Negative 11/25/2017 04:24 PM    GLUCOSEU NEGATIVE 02/13/2021 03:03 PM    KETUA NEGATIVE 02/13/2021 03:03 PM         Radiology:  Reviewed as available.    Assessment/Plan:    CKD stage 3B sus CRS  Creat 1.6  slightly high   No proteinuria   Torsemide 10 mg with edema.   On Metfrmin   Plan:  I will discuss w/ her PCP d/c Metformin and Increase Torsemide   No need for RAASi/ SGLT-2 for now   Check SPEP, FLC repeat UA and microscopy    RTC 2 months     *** call md 8/24      H/o CHF but EF improved  Still edematous   Resumed  spironolactone   Has increased edema   Will give torsemide 10 mg po daily- I will discuss increasing the dose      H/o adrenal nodule . CT scan in 2020  Check CT abdomen to see the status of adrenal nodule     Discussed with patient in detail.   Counseling done for diet and lifestyle changes.  Explained importance of compliance with medications     Shirlee More, MD  Nephrology     [facility] Office : [address], [address] , [place] [address]  [facility] Office: [address], [place], [place] [address]  [facility] Office: [address] [place] [address].  [facility] Office: [address], [place] [address]  Office : [phone]  Fax  :[phone]

## 2022-07-28 NOTE — Telephone Encounter (Signed)
Pt notified and verbalized understanding.  Form placed at front desk to wait pick up

## 2022-07-28 NOTE — Patient Instructions (Signed)
Bolod work and urine test before next visit     No NSIADS

## 2022-08-11 NOTE — Telephone Encounter (Signed)
From: [name]  To: Dr. Laurence Slate  Sent: 08/08/2022 1:24 PM EDT  Subject: My Metformin    Hi Dr, Michela Pitcher,  On my visit a couple weeks ago we talked about discontinuing my metformin. You said to continue until you talked to Dr, Grandville Silos then you would get back with me about discontinuing it. Ive not heard anything from you.  I definetly dont want to take this medication if there's a chance it could do any harm to my kidneys.   My dietician is who prescribed it for me.   Dr. Grandville Silos is my primary dr. If you have any questions please feel free to call me.   Thank you so much.  Rachael Mays

## 2022-08-12 NOTE — Telephone Encounter (Signed)
Formatting of this note might be different from the original.  Medication:  Last Refill:    Last OV: Visit date not found  Future OV: Visit date not found  Due to F/U:    Pharmacy verified?: Yes    [facility] [id number], [place]  [address]  [place] [address]  Phone: [phone] Fax: [phone]      Electronically signed by Jordan Likes, ARRT at 08/12/2022  1:27 PM EDT

## 2022-08-19 NOTE — Telephone Encounter (Signed)
[facility] precert called for office notes for pre cert for MRI abdomen. Records have been faxed to [id number] case # [id number]

## 2022-08-22 ENCOUNTER — Encounter

## 2022-08-26 NOTE — Telephone Encounter (Signed)
Formatting of this note is different from the original.  [place] A REFILL ON     LAST OV: 02/07/2021    NEXT OV: Visit date not found     Pt needs OV    LAST LABS:   Lab Results   Component Value Date    NA 139 06/25/2022    K 4.5 06/25/2022    CL 104 06/25/2022    CO2 24 06/25/2022    AGAP 11 06/25/2022    GLU 106 (H) 06/25/2022    BUN 21 06/25/2022    CRET 1.59 (H) 06/25/2022    GFRAA 86 09/19/2020    GFRNA 74 09/19/2020    ALT 16 06/25/2022    ALB 4.4 06/25/2022    ALKP 88 06/25/2022    AST 18 06/25/2022     WBC (x10(3)/mcL)   Date Value   06/25/2022 8.5     HEMOGLOBIN (g/dL)   Date Value   06/25/2022 12.4     HEMATOCRIT (%)   Date Value   06/25/2022 41.1     PLATELET (x10(3)/mcL)   Date Value   06/25/2022 201     CHOLESTEROL   Date Value Ref Range Status   06/25/2022 144 <200 mg/dL Final     Comment:     (NOTE)  < 200            Desirable  200 - 239     Borderline High  >= 240          High      HDL CHOLESTEROL   Date Value Ref Range Status   06/25/2022 61 >=40 mg/dL Final     Comment:     (NOTE)   > 60      Optimal  40 - 60    Acceptable    < 40      Low      LDL CHOLESTEROL (CALCULATED)   Date Value Ref Range Status   06/25/2022 63 <100 mg/dL Final     TRIGLYCERIDE   Date Value Ref Range Status   06/25/2022 111 <150 mg/dL Final     Comment:     (NOTE)  < 150         Normal  150 - 199    Borderline High  200 - 499    High   >= 500       Very High     No results found for: "TSH3G", "DIG"     Preferred Pharmacy: [facility] [id number], [place]  [phone]    Horatio Pel      Electronically signed by Horatio Pel at 08/26/2022  4:49 PM EDT

## 2022-09-03 NOTE — Telephone Encounter (Signed)
Pt called stating that she saw her PCP and was told to call Dr Michela Pitcher right away for her results. She had labs done at [facility] and MRI also.

## 2022-09-04 NOTE — Telephone Encounter (Signed)
LVM for pt: Dr Michela Pitcher said nothing looks concerning on her results.

## 2022-09-04 NOTE — Telephone Encounter (Signed)
error 

## 2022-09-23 NOTE — Telephone Encounter (Signed)
Received refill request for Spironolactone '25mg'$  from [facility].     Last OV: 06-03-2022 [facility]    Next OV: None    Last Labs: 07-28-2022 BMP    Last Filled:  08-26-2022

## 2022-09-23 NOTE — Telephone Encounter (Addendum)
Medication Refill    Medication needing refilled:  Spironolactone- Pt states she only has one dose left    Dosage of the medication: 25 mg    How are you taking this medication (QD, BID, TID, QID, PRN): once daily    30 or 90 day supply called in: 90 day supply    When will you run out of your medication:    Which Pharmacy are we sending the medication to?: [facility] [id number], [place] [phone] - F [phone]

## 2022-09-25 NOTE — Telephone Encounter (Signed)
Formatting of this note might be different from the original.  Patient last appointment 02/2021, looks like she is following at [facility] now.   Electronically signed by Adora Fridge, CNP at 09/25/2022  2:55 PM EDT

## 2022-09-25 NOTE — Telephone Encounter (Signed)
Formatting of this note is different from the original.  Last OV: 02/07/2021    Next OV: TBD    *Sent patient a mychart message letting her know she needs to schedule an appointment for further refills*    Labs:       CBC:   WBC   Date Value Ref Range Status   06/25/2022 8.5 3.7 - 10.3 x10(3)/mcL Final     HEMOGLOBIN   Date Value Ref Range Status   06/25/2022 12.4 11.2 - 15.7 g/dL Final     HEMATOCRIT   Date Value Ref Range Status   06/25/2022 41.1 34.0 - 45.0 % Final     PLATELET   Date Value Ref Range Status   06/25/2022 201 155 - 369 x10(3)/mcL Final     BMP:   BLD UREA NITROGEN   Date Value Ref Range Status   08/29/2022 23 8 - 26 mg/dL Final     SODIUM   Date Value Ref Range Status   08/29/2022 140 135 - 145 mEq/L Final     POTASSIUM   Date Value Ref Range Status   08/29/2022 3.9 3.6 - 5.1 mEq/L Final     CHLORIDE   Date Value Ref Range Status   08/29/2022 108 98 - 111 mEq/L Final     CO2   Date Value Ref Range Status   08/29/2022 25 21 - 31 mmol/L Final     CALCIUM   Date Value Ref Range Status   08/29/2022 10.1 8.5 - 10.4 mg/dL Final     CREATININE   Date Value Ref Range Status   08/29/2022 1.5 (H) 0.6 - 1.2 mg/dL Final     CHOLESTEROL (mg/dL)   Date Value   65/78/4696 144     TRIGLYCERIDE (mg/dL)   Date Value   29/52/8413 111     HDL CHOLESTEROL (mg/dL)   Date Value   24/40/1027 61     LDL CHOLESTEROL (CALCULATED) (mg/dL)   Date Value   25/36/6440 63       Electronically signed by Izora Gala, Registered Nurse at 09/25/2022  2:23 PM EDT

## 2022-09-25 NOTE — Telephone Encounter (Signed)
[facility] not prescriber of medication, send to PCP Myer Peer

## 2022-09-25 NOTE — Telephone Encounter (Signed)
Spoke with patient that is in need of the medication Spironolactone 25 mg at the same [facility]  and would like your call once it has been sent to pick it up.   Thank you

## 2022-09-25 NOTE — Telephone Encounter (Signed)
Formatting of this note is different from the original.  Last OV: 02/07/2021    Next OV: TBD    *Sent patient a mychart message letting her know she needs to schedule an appointment for further refills*    Labs:       CBC:   WBC   Date Value Ref Range Status   06/25/2022 8.5 3.7 - 10.3 x10(3)/mcL Final     HEMOGLOBIN   Date Value Ref Range Status   06/25/2022 12.4 11.2 - 15.7 g/dL Final     HEMATOCRIT   Date Value Ref Range Status   06/25/2022 41.1 34.0 - 45.0 % Final     PLATELET   Date Value Ref Range Status   06/25/2022 201 155 - 369 x10(3)/mcL Final     BMP:   BLD UREA NITROGEN   Date Value Ref Range Status   08/29/2022 23 8 - 26 mg/dL Final     SODIUM   Date Value Ref Range Status   08/29/2022 140 135 - 145 mEq/L Final     POTASSIUM   Date Value Ref Range Status   08/29/2022 3.9 3.6 - 5.1 mEq/L Final     CHLORIDE   Date Value Ref Range Status   08/29/2022 108 98 - 111 mEq/L Final     CO2   Date Value Ref Range Status   08/29/2022 25 21 - 31 mmol/L Final     CALCIUM   Date Value Ref Range Status   08/29/2022 10.1 8.5 - 10.4 mg/dL Final     CREATININE   Date Value Ref Range Status   08/29/2022 1.5 (H) 0.6 - 1.2 mg/dL Final     CHOLESTEROL (mg/dL)   Date Value   06/25/2022 144     TRIGLYCERIDE (mg/dL)   Date Value   06/25/2022 111     HDL CHOLESTEROL (mg/dL)   Date Value   06/25/2022 61     LDL CHOLESTEROL (CALCULATED) (mg/dL)   Date Value   06/25/2022 63       Electronically signed by Marti Sleigh, MA at 09/25/2022  2:23 PM EDT

## 2022-09-28 MED ORDER — SPIRONOLACTONE 25 MG PO TABS
25 MG | ORAL_TABLET | Freq: Every day | ORAL | 3 refills | Status: AC
Start: 2022-09-28 — End: 2023-01-29

## 2022-09-28 NOTE — Telephone Encounter (Signed)
Medication is being managed by nephrology.   Called and spoke with Nephrologist's office. Confirmed they have the refill request and pharmacy, they will send refill.  Called and updated [name], she v/u. She has an appt with their office tomorrow as well.

## 2022-09-28 NOTE — Telephone Encounter (Signed)
I spoke with pt. She stated that [facility] did prescribed. Then she tried another Film/video editor  in [place], then came back to [facility] Marland Kitchen She has been pout of this med for 3 days.

## 2022-09-28 NOTE — Telephone Encounter (Signed)
Last Office Visit 07/2022  Next Office Visit Tomorrow

## 2022-09-29 ENCOUNTER — Ambulatory Visit
Admit: 2022-09-29 | Discharge: 2022-09-29 | Payer: PRIVATE HEALTH INSURANCE | Attending: Internal Medicine | Primary: Sports Medicine

## 2022-09-29 DIAGNOSIS — I10 Essential (primary) hypertension: Secondary | ICD-10-CM

## 2022-09-29 NOTE — Patient Instructions (Addendum)
Please avoid NSAIDs medications  - e.g. Advil, motrin, ibuprofen, aleve, diclofenac, mobic, celebrex, nabumetone.etc: they can harm your kidneys!     Send me BP readings and weight in 2-3 weeks.     Please call the office (and ask to see me) or be seen by other provider for any worsening or lack of improvement of the symptoms or for any new symptoms as soon as possible.     Please make sure to schedule your follow appointment as discussed.   Please let me know if ay further questions.   Please let me know if there are any symptoms or concerns that you feel that needs to be further addressed.

## 2022-09-29 NOTE — Progress Notes (Signed)
Office : [phone]     Fax :[phone]      Nephrology progress Note        History of Present Illness:    This is a 66 y.o. female who presents to the office for evaluation of  worsening renal function .  H/o DM 2 - none   H/o HTN - now BP better after loss of weight   H/o Renal stones - none   H/o renal disease in family   H/o CAD - h/o Stent in past. H/o CHF   CHF?     H/o smoking .    Today visit 09/29/22    Recently stopped lisinopril and spironolactone    Cr 1.3- 1.5 9/23  <-- 1.6 7/23<--1.4 4/23<--1. 3/23<--1.3<--2.0 (2/23) <--1.8 1/23<--1.4 8/22<--1.1 3/22<-- normal 2021 <--base line 1.0-1.1     GFR 44  Lytes ok   UPCR 0.07  UACR - neg 7/23  UA- no RBC WBC   A1C 5.7- Metformin   HCV neg   CT ABD 5/23: KIDNEYS/URETERS:  Right renal cyst.  No hydronephrosis, stone, or suspicious mass . ADRENAL LESION  Mri 9/23: 1.3 cm right adrenal nodule demonstrates signal characteristics consistent with an adenoma.   S/p CTPA 2/22  Echo 2/13:    systolic function with an estimated ejection fraction of 55-60%.   No regional wall motion abnormalities are seen.   Normal diastolic function.E/e"=11.35.    BP here 145/77  BP at home 120s/60s   No usage of NSAIDS   Diuretcis dose was reduced and Lisinopril stopped     She reports CP - tightness and SOB   Pain not worse w/ breathing, she will see her PCP later today   Pt denies palpitations/abdominal pain/N/V.   Pt denies fever/ chills  Pt denies dysuria or hematuria   No joint pain/ rash   Making good urine   Some leg swelling.   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/2017    Depression     GERD (gastroesophageal reflux  disease)     NO LONGER SINCE LAP    GERD (gastroesophageal reflux disease) 01/23/2009    Hx of blood clots     Hyperlipidemia     hx; resolved with lap band    Hypertension     Idiopathic peripheral neuropathy 03/26/2017    Melanoma ([place])     upper back    MRSA (methicillin resistant staph aureus) culture positive 10/17/2019    leg abscess    Obesity     hx of; had lap band    Obstructive sleep apnea 09/18/2015    uses bi-pap    Obstructive sleep apnea (adult) (pediatric) 09/18/2015    Pulmonary embolism ([place])     Stenosis of right carotid artery 06/16/2021    Type 2 diabetes mellitus with diabetic polyneuropathy, without long-term current use of insulin ([place]) 01/26/2022       Past Surgical History:        Procedure Laterality Date    BACK SURGERY  neck  plates    BREAST SURGERY      left lumpectomy    CERVICAL FUSION      CESAREAN SECTION      CHOLECYSTECTOMY      COLONOSCOPY      COLONOSCOPY  04/08/2010    COLONOSCOPY N/A 04/18/2021    COLONOSCOPY DIAGNOSTIC performed by Norwood Levo, MD at [place] CATH LAB PROCEDURE      DILATATION, ESOPHAGUS      ENDOSCOPY, COLON, DIAGNOSTIC      HYSTERECTOMY (CERVIX STATUS UNKNOWN)      HYSTERECTOMY, VAGINAL      LAP BAND  05/01/2008    Dr. Nicole Cella    OTHER SURGICAL HISTORY      [place] Filter: curently in place    OTHER SURGICAL HISTORY  07/05/2013    lap band removal    SHOULDER SURGERY      SKIN CANCER EXCISION  12/29/2017    UPPER GASTROINTESTINAL ENDOSCOPY  05/19/2013    UPPER GASTROINTESTINAL ENDOSCOPY  07/21/2013    ESOPHAGEAL STENT PLACEMENT    UPPER GASTROINTESTINAL ENDOSCOPY N/A 07/31/2014    Esophagogastroduodenoscopy with esophageal balloon dilation    UPPER GASTROINTESTINAL ENDOSCOPY N/A 04/18/2021    EGD DILATION BALLOON performed by Norwood Levo, MD at [place]  04/2015    Lucendia Herrlich, celiac artery angiogram, normal abd arteries       Current Medications:    Current  Outpatient Medications   Medication Sig Dispense Refill    spironolactone (ALDACTONE) 25 MG tablet Take 1 tablet by mouth daily 30 tablet 3    gabapentin (NEURONTIN) 100 MG capsule Take 1 capsule by mouth nightly.      metFORMIN (GLUCOPHAGE) 500 MG tablet Take 1 tablet by mouth 2 times daily (with meals)      isosorbide mononitrate (IMDUR) 30 MG extended release tablet Take 1 tablet by mouth daily 90 tablet 3    nitroGLYCERIN (NITROSTAT) 0.4 MG SL tablet up to max of 3 total doses. If no relief after 1 dose, call 911. 25 tablet 3    ranolazine (RANEXA) 500 MG extended release tablet TAKE ONE TABLET BY MOUTH TWICE A DAY 180 tablet 3    fluocinonide (LIDEX) 0.05 % external solution APPLY SPARINGLY TO THE BACK OF THE SCALP DAILY AS NEEDED FOR ITCHING 60 mL 1    clobetasol (TEMOVATE) 0.05 % cream Apply to affected area twice daily for up to 2 weeks or until improved. 60 g 1    dilTIAZem (CARDIZEM CD) 180 MG extended release capsule TAKE ONE CAPSULE BY MOUTH DAILY 90 capsule 3    omeprazole (PRILOSEC) 40 MG delayed release capsule TAKE ONE CAPSULE BY MOUTH DAILY 30 capsule 0    Tirzepatide (MOUNJARO) 5 MG/0.5ML SOPN SC injection once a week      triamcinolone (KENALOG) 0.1 % cream Apply to affected area twice daily for up to 2 weeks or until improved. 15 g 2    topiramate (TOPAMAX) 25 MG tablet Take 2 tablets by mouth 2 times daily      montelukast (SINGULAIR) 10 MG tablet TAKE ONE TABLET BY MOUTH DAILY. **MUST CALL MD FOR APPOINTMENT 30 tablet 5    oxyCODONE-acetaminophen (PERCOCET) 7.5-325 MG per tablet as needed.      rosuvastatin (CRESTOR) 20 MG tablet daily      magnesium (MAGNESIUM-OXIDE) 250 MG TABS tablet Take 350 mg by  mouth daily      Coenzyme Q10 (CO Q10) 200 MG CAPS Take by mouth daily (with breakfast)      calcium citrate-vitamin D (CITRICAL + D) 315-250 MG-UNIT TABS per tablet Take 2 tablets by mouth 2 times daily      diclofenac sodium (VOLTAREN) 1 % GEL APPLY 4 GRAMS TO AFFECTED AREA(S) TWO TIMES A DAY  100 g 1    Cholecalciferol (VITAMIN D3) 50 MCG (2000 UT) CAPS Take 1 capsule by mouth daily 90 capsule 3    albuterol sulfate HFA (PROVENTIL HFA) 108 (90 Base) MCG/ACT inhaler Inhale 2 puffs into the lungs every 6 hours as needed for Wheezing 3 Inhaler 3    lidocaine (LIDODERM) 5 % Place 1 patch onto the skin daily 12 hours on, 12 hours off. (Patient taking differently: Place 1 patch onto the skin as needed 12 hours on, 12 hours off.) 30 patch 0    aspirin 81 MG EC tablet Take 1 tablet by mouth daily 30 tablet 11    Multiple Vitamins-Minerals (MULTIVITAMIN PO) Take 1 tablet by mouth daily       torsemide (DEMADEX) 10 MG tablet Take 1 tablet by mouth daily 90 tablet 2     No current facility-administered medications for this visit.       Allergies:  Ibuprofen, Lisinopril, Seasonal, and Talwin [pentazocine]    Social History:   Social History     Socioeconomic History    Marital status: Divorced     Spouse name: [name]    Number of children: 2    Years of education: 12    Highest education level: Not on file   Occupational History    Not on file   Tobacco Use    Smoking status: Former     Packs/day: 0.50     Years: 40.00     Additional pack years: 0.00     Total pack years: 20.00     Types: Cigarettes     Quit date: 08/07/2012     Years since quitting: 10.1    Smokeless tobacco: Former     Quit date: 06/16/2013    Tobacco comments:     started to smoke at age 68 / only smoked 0.5 p.p.d    Vaping Use    Vaping Use: Never used   Substance and Sexual Activity    Alcohol use: No     Alcohol/week: 0.0 standard drinks of alcohol    Drug use: No    Sexual activity: Not Currently   Other Topics Concern    Not on file   Social History Narrative    Works as Scientist, water quality at [employer]: [place]  (05/15/2020)    Overall Financial Resource Strain (CARDIA)     Difficulty of Paying Living Expenses: Not hard at all   Food Insecurity: No Food Insecurity (05/15/2020)    Hunger Vital  Sign     Worried About Running Out of Food in the Last Year: Never true     Ran Out of Food in the Last Year: Never true   Transportation Needs: Not on file   Physical Activity: Not on file   Stress: Not on file   Social Connections: Not on file   Intimate Partner Violence: Not on file   Housing Stability: Not on file       Family History:   Family  History   Problem Relation Age of Onset    Arthritis Mother     Cancer Mother     Depression Mother     High Blood Pressure Mother     Heart Disease Mother 27        MI     Ovarian Cancer Mother 44    Cancer Father 90        colon    Heart Disease Maternal Grandmother     Early Death Paternal Grandmother     Heart Disease Paternal Grandfather     Diabetes Other     High Blood Pressure Other     Obesity Other     Other Sister         OSA    Other Brother         OSA    Other Brother         OSA    Other Daughter         OSA       Review of Systems:    Constitutional: No fever, no chills, no lethargy, no weakness.  HEENT:  No headache,  sore throat.  Cardiac:  No chest pain, dyspnea, orthopnea or PND.  Chest:              No cough, phlegm or wheezing.  Abdomen:  No abdominal pain, nausea or vomiting.  Neuro:                No focal weakness, abnormal movements orseizure like activity.  Skin:   No rashes, no itching.  GU:   No hematuria, no pyuria, no dysuria, no flank pain.  Extremities:  No swelling       Objective:  BP (!) 145/77   Pulse 68   Wt (!) 138.3 kg (305 lb)   BMI 50.75 kg/m     Physical Exam:  General appearance:Awake, alert, in no acute distress  Skin: warm and dry, no rash or erythema    Eyes: conjunctivae normal and sclera anicteric    ENT: :no thrush no pharyngeal congestion      Neck: without any JVD. No carotid bruits or thyromegaly.      Pulmonary: b/l rales     Cardiovascular:Normal S1 & S2, No S3 or  S4, No  Pericardial rub , No Murmur     Abdomen: soft nontender, bowel sounds present, no organomegaly,  No ascites      Extremities: no cyanosis, +1  b/l pitting edema    Labs:    CBC:   Lab Results   Component Value Date/Time    WBC 8.3 10/14/2021 08:40 AM    RBC 4.16 10/14/2021 08:40 AM    HGB 12.3 10/14/2021 08:40 AM    HCT 37.1 10/14/2021 08:40 AM    MCV 89.2 10/14/2021 08:40 AM    RDW 14.0 10/14/2021 08:40 AM    PLT 218 10/14/2021 08:40 AM    MPV 8.4 10/14/2021 08:40 AM      BMP:   Lab Results   Component Value Date/Time    NA 143 03/12/2022 01:40 PM    NA 145 02/27/2022 03:32 PM    NA 143 02/18/2022 04:20 PM    K 4.7 03/12/2022 01:40 PM    K 4.5 02/27/2022 03:32 PM    K 4.1 02/18/2022 04:20 PM    K 4.6 10/17/2019 06:07 AM    K 3.9 05/05/2017 03:21 AM    CL 107 03/12/2022 01:40 PM  CL 108 02/27/2022 03:32 PM    CL 107 02/18/2022 04:20 PM    CO2 21 03/12/2022 01:40 PM    CO2 24 02/27/2022 03:32 PM    CO2 25 02/18/2022 04:20 PM    BUN 18 03/12/2022 01:40 PM    BUN 20 02/27/2022 03:32 PM    BUN 17 02/18/2022 04:20 PM    CREATININE 1.4 03/12/2022 01:40 PM    CREATININE 1.4 02/27/2022 03:32 PM    CREATININE 1.3 02/18/2022 04:20 PM    GLUCOSE 129 03/12/2022 01:40 PM    GLUCOSE 123 02/27/2022 03:32 PM    GLUCOSE 101 02/18/2022 04:20 PM    CALCIUM 9.5 03/12/2022 01:40 PM    CALCIUM 9.9 02/27/2022 03:32 PM    CALCIUM 9.6 02/18/2022 04:20 PM      BNP:No results found for: "BNP"  PHOSPHORUS:    Lab Results   Component Value Date/Time    PHOS 3.8 08/21/2013 03:46 PM    PHOS 3.3 08/18/2013 08:13 AM    PHOS 3.5 08/17/2013 06:49 AM     MAGNESIUM:   Lab Results   Component Value Date/Time    MG 2.20 05/07/2017 04:45 AM     ALBUMIN:   Lab Results   Component Value Date/Time    LABALBU 4.0 02/18/2022 04:30 PM     IRON:    Lab Results   Component Value Date/Time    IRON 93 03/26/2010 05:35 PM     IRON SATURATION:    Lab Results   Component Value Date/Time    LABIRON 24 03/26/2010 05:35 PM     TIBC:    Lab Results   Component Value Date/Time    TIBC 397 03/26/2010 05:35 PM     FERRITIN:    Lab Results   Component Value Date/Time    FERRITIN 61.9 05/02/2009 05:40 PM     ANA:    Lab Results   Component Value Date    ANA Negative 07/26/2019       SPEP:   Lab Results   Component Value Date/Time    PROT 6.8 02/18/2022 04:30 PM    PROT 7.3 03/31/2011 03:07 PM    LABALPH 0.2 03/29/2017 03:33 PM    LABALPH 0.7 03/29/2017 03:33 PM    GAMGLOB 1.2 03/29/2017 03:33 PM     UPEP: No results found for: "TPU"   HEPBSAG:No results found for: "HEPBSAG"  HEPCAB:No results found for: "HEPCAB"  C3: No results found for: "C3"  C4: No results found for: "C4"  MPO ANCA: No results found for: "MPO" .  PR3 ANCA:  No results found for: "PR3"  PTH: No results found for: "PTH"    Urine Creatinine:    Lab Results   Component Value Date/Time    LABCREA 241.1 02/13/2021 03:04 PM     Urine Eosinophils: No results found for: "UREO"  Urine Protein:  No results found for: "TPU"  Urinalysis:  U/A:   Lab Results   Component Value Date/Time    NITRU NEGATIVE 02/13/2021 03:03 PM    COLORU Yellow 02/13/2021 03:03 PM    PHUR 5.5 11/25/2017 04:24 PM    LABCAST 0-1 Hyaline 07/09/2013 02:09 AM    WBCUA 4 07/16/2014 07:26 PM    RBCUA NEGATIVE 02/13/2021 03:03 PM    MUCUS PRESENT 02/13/2021 03:03 PM    YEAST 0 11/25/2017 04:24 PM    BACTERIA 0 11/25/2017 04:24 PM    BACTERIA 2+ 07/16/2014 07:26 PM    CLARITYU Clear 02/13/2021 03:03 PM  SPECGRAV 1.025 02/13/2021 03:03 PM    LEUKOCYTESUR NEGATIVE 02/13/2021 03:03 PM    LEUKOCYTESUR 0 to 5 02/13/2021 03:03 PM    UROBILINOGEN 2+ (4-6 mg/dL) 02/13/2021 03:03 PM    BILIRUBINUR NEGATIVE 02/13/2021 03:03 PM    BLOODU Negative 11/25/2017 04:24 PM    GLUCOSEU NEGATIVE 02/13/2021 03:03 PM    KETUA NEGATIVE 02/13/2021 03:03 PM         Radiology:  Reviewed as available.    Assessment/Plan:    CKD stage 3B sus CRS  Creat 1.3-1.5 slightly  better   No proteinuria   Torsemide 10 mg with edema.   Off  Metfrmin   FLC SPEP- normal   Plan:  No need for RAASi/ SGLT-2 for now   Recheck labs next visit 47 M      H/o CHF but EF improved, CAD  Still edematous but better   Resumed  spironolactone   Keep   torsemide 10 mg po daily- I will discuss increasing the dose   Check P with PCP today including EKG     H/o adrenal nodule . CT scan in 2020  Check CT abdomen to see the status of adrenal nodule   Repeat  mri- BENIGN     HTN   BP reasonable   Spironolactone renewed   Discussed low salt diet  No NSAIDS  Keep current meds  Recheck BP at PCP office today      Discussed with patient in detail.   Counseling done for diet and lifestyle changes.  Explained importance of compliance with medications     Laurence Slate, MD  Nephrology     [facility] Office : [address], [address] , [place] [address]  [facility] Office: [address] [place], [place] [address]  [facility] Office: [address] [place] [address].  [facility] Office: [address] [place] [address]  Office : [phone]  Fax :[phone]     Discussed w/ MD- Metformin discontinued

## 2022-09-29 NOTE — Progress Notes (Signed)
Associated Order(s): ECG/Review, Interpret Only  Formatting of this note is different from the original.  Subjective   Subjective:    Rachael Mays is a 66 year old female who presents today with   Chief Complaint   Patient presents with   ? Follow-up   ? Dyspepsia     - Continue omeprazole 40 mg daily  -Start Pepcid 20 mg twice daily as needed      Chest Pain   - Not currently   - Previously sternal, with left arm radiation   -Intermittent  -Denies diaphoresis  -Has not tried nitro  -Has not discussed with cardiology  - EKG is unchanged   - Increased in dyspnea  - Has been out of her spironolactone , recently refilled by nephrology (originally prescribed by cardiology)    ESTIMATED GFR   Date Value Ref Range Status   08/29/2022 44 (L) >59 mL/min/1.73 m2 Final     Comment:     Estimated GFR was calculated using the CKD-EPI cr (2021) equation refit without race.  The equation is recommended by the [place] [facility] [employer].  Tested at [facility] [place] [address]    06/25/2022 36 (L) >=60 mL/min/1.73 m2 Final     Comment:     (NOTE)  Estimated GFR was calculated using the CKD-EPIcr (2021) equation  refit without race. The equation is recommended by the [place] [facility] [employer].    01/16/2022 27 (L) >=60 mL/min/1.73 m2 Final     Comment:     (NOTE)  Estimated GFR was calculated using the CKD-EPIcr (2021) equation  refit without race. The equation is recommended by the [place] [facility] [employer].  Tested at: [facility], [address] [address]      -Dyspepsia  -Currently well controlled  -Omeprazole 40 mg daily  -Famotidine 20 mg twice daily as needed, currently only taking at night as needed        Health Maintenance   Topic Date Due   ? Eye Exam  Never done   ? PAP Screening  Never done   ? Colonoscopy  Never done   ? Foot Exam  12/07/2021    ? Influenza Vaccine (1) 08/07/2022   ? COVID-19 Vaccine (4 - 2023-24 season) 08/07/2022   ? HB A1C  12/26/2022   ? CHOLESTEROL DIABETIC  06/26/2023   ? Mammogram Screening (Annual)  08/27/2023   ? Microalb/Creat Ratio  08/30/2023   ? GFR  08/30/2023   ? Pneumococcal 0-64 (3 - PPSV23 or PCV20) 07/12/2024   ? DTap,Tdap,and Td (3 - Td or Tdap) 10/15/2029   ? Shingrix  Completed   ? Hepatitis C Screening  Completed   ? HPV  Aged Out   ? Meningococcal conjugate valent 4 (MCV4)  Aged Out     Past Medical History:   Diagnosis Date   ? Congestive heart failure (CHF) ([place])    ? Coronary atherosclerosis    ? DVT (deep venous thrombosis) ([place])    ? Esophageal reflux    ? Hyperlipidemia    ? Hypertension      Past Surgical History:   Procedure Laterality Date   ? BREAST BIOPSY     ? BREAST SURGERY      biopsy (negative)   ? CERVICAL SPINE SURGERY     ? CHOLECYSTECTOMY WITH/WITHOUT COMMON DUCT EXPLORATION     ?  CORONARY ANGIOPLASTY WITH STENT PLACEMENT  03/2019    stent   ? HYSTERECTOMY     ? LAPAROSCOPIC GASTRIC BANDING      placed 2010, removed 2015       History   Alcohol Use Not Currently     Comment: on rare occasion     Social History     Tobacco Use   Smoking Status Former   ? Types: Cigarettes   Smokeless Tobacco Never   Tobacco Comments    quit 2013     Counseling given: Not Answered  Tobacco comments: quit 2013    Family History   Problem Relation Age of Onset   ? Heart Attack Mother    ? Ovarian cancer Mother    ? Hypertension Mother    ? High Cholesterol Mother    ? Thyroid Disease Sister    ? Diabetes Maternal Grandfather    ? Heart Attack Paternal Grandmother    ? Breast cancer Neg Hx      Allergies   Allergen Reactions   ? Pentazocine Other (See Comments)     dizzy    ? Ibuprofen Unknown   ? Morphine Other (See Comments)   ? Seasonal Ic [Cholestatin] Unknown   ? Talwin [Pentazocine Lactate] Other (See Comments)     hallucinations     No outpatient medications have been marked as taking for the 09/29/22 encounter  (Office Visit) with Myer Peer, DO.     Review of Systems   Constitutional: Negative for chills and fever.   Respiratory: Negative for cough, chest tightness and shortness of breath.    Cardiovascular: Negative for chest pain and palpitations.   Gastrointestinal: Negative for abdominal pain, constipation, diarrhea, nausea and vomiting.   Skin: Negative for rash.   Psychiatric/Behavioral: Negative for dysphoric mood. The patient is not nervous/anxious.      Vitals:    09/29/22 1554   BP: 126/64   Pulse: 68   Temp: 97.6 F (36.4 C)   TempSrc: Temporal   Weight: 309 lb 9.6 oz (140.4 kg)   Height: 65" (165.1 cm)     Body mass index is 51.52 kg/m.         Objective   Objective:  Physical Exam  Constitutional:       General: She is not in acute distress.     Appearance: She is well-developed. She is not diaphoretic.   Cardiovascular:      Rate and Rhythm: Normal rate and regular rhythm.      Heart sounds: No murmur heard.     No friction rub. No gallop.   Pulmonary:      Effort: Pulmonary effort is normal.      Breath sounds: Normal breath sounds. No wheezing, rhonchi or rales.   Abdominal:      General: Bowel sounds are normal. There is no distension.      Palpations: Abdomen is soft.      Tenderness: There is no abdominal tenderness.   Neurological:      Mental Status: She is alert and oriented to person, place, and time.     ECG/Review, Interpret Only    Date/Time: 09/30/2022 12:05 PM    Performed by: Myer Peer, DO  Authorized by: Myer Peer, DO  Comparison: compared with previous ECG from 08/25/2021  Similar to previous ECG  Rhythm: sinus rhythm  Rate: normal  QRS axis: normal  ST Segments: ST segments normal  T Waves: T waves normal  Other findings  comments: Low voltage  Clinical impression: non-specific ECG    Chronic Disease Management    Assessment & Plan:  Rachael Mays is a 66 year old female who presents today with   Chief Complaint   Patient presents with   ? Follow-up   ? Dyspepsia      - Continue omeprazole 40 mg daily  -Start Pepcid 20 mg twice daily as needed      Diagnoses and all orders for this visit:    1. Chest pain, unspecified type (Primary)  -     ECG 12-LEAD  - [name] reports recent onset of chest pain.  She denies current symptoms.  Notes the pain has been intermittent.  EKG today was reviewed and is similar to prior EKG with sinus rhythm with low voltage criteria.  I recommended she reach out to her cardiologist office to get in for a stat appointment due to her significant coronary artery disease history with previous stenting in April 2017.  I discussed with her that if her symptoms recur, I would recommend that she present to the emergency department for definitive evaluation.  She is understanding and agreeable to this plan.    2. Dyspepsia  Overview:  - Continue omeprazole 40 mg daily  -Continue Pepcid 20 mg twice daily as needed  -Currently well controlled, continue to monitor  -Follow-up, if not improving, consider H. pylori testing versus referral to GI due to history of gastric banding erosion    3. CAD in native artery  Overview:  -Managed by Dr. Latricia Heft  -Status post PTCA LAD in April 2017, subsequent cath in 02/2021 demonstrated patent stent  -Increase to rosuvastatin 40 mg daily  -Ranexa 500 mg twice daily   -Nitroglycerin as needed, once every couple months   - ASA 81 mg daily     4. Adrenal mass, left [facility])  Overview:  - Managed by nephrology  -MRI abdomen October 2023 shows stable adrenal adenoma  -Per nephrology no further work-up at this time    I have personally spent at least 40 minutes preparing to see the patient, reviewing past medical history including relevant documentation, laboratory studies and imaging studies from other providers, speaking with the patient, performing an appropriate evaluation of the patient, ordering tests, managing medications, and documenting clinical information in the chart.    Return in about 3 months (around 12/30/2022) for Med Check  (Complex) .     Myer Peer, [place]  [facility] Medicine  Sports Medicine  09/29/2022    Please note that some or all of this record was generated using voice recognition software. If there are any questions about the content of this document, please contact the author as some errors in transcription may have occurred.    Electronically signed by Myer Peer, DO at 09/30/2022 12:17 PM EDT

## 2022-09-29 NOTE — Progress Notes (Signed)
Formatting of this note might be different from the original.  3:53 PM    Follow up Dyspepsia    - Continue omeprazole 40 mg daily  -Start Pepcid 20 mg twice daily as needed    -C/O Chest Pain X 1 week  -Started after pt ran out of spironolactone, had a delay in refill  -Pain is left side, over heart, radiates down left arm  -Has Nitro at home but did not remember to take it  -Mentioned to Kidney doctor today during OV, but no means to do EKG in the office    3 mo complex OV scheduled for Med Check  Electronically signed by Waynetta Sandy, LPN at 21/30/8657 84:69 PM EDT

## 2022-10-14 ENCOUNTER — Ambulatory Visit
Admit: 2022-10-14 | Discharge: 2022-10-14 | Payer: PRIVATE HEALTH INSURANCE | Attending: Dermatology | Primary: Sports Medicine

## 2022-10-14 DIAGNOSIS — B360 Pityriasis versicolor: Secondary | ICD-10-CM

## 2022-10-14 MED ORDER — KETOCONAZOLE 2 % EX CREA
2 % | CUTANEOUS | 2 refills | Status: AC
Start: 2022-10-14 — End: 2023-04-06

## 2022-10-14 NOTE — Progress Notes (Signed)
[facility]  Antonietta Breach, MD  [phone]      Rachael Mays  [birth year]    66 y.o. female     Date of Visit: 10/14/2022    Chief Complaint: skin lesions    History of Present Illness:    1.  She has an asymptomatic eruption on the upper portion of the back.    2.  She reports intermittent itching and scaling in the left ear.    3.  She has multiple moles on the trunk and extremities-not aware of any changes in size, color, or shape.    4.  She reports a couple of asymptomatic growths on the right lateral cheek and right lateral breast.    5.  She reports several asymptomatic lesions of the breast and also on the right thigh.    6.  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 Caryn Section on 12/29/2017.  She denies any signs of recurrence.      Review of Systems:  Gen: Feels well, good sense of health.  Past Medical History, Family History, Surgical History, Medications and Allergies reviewed.    Past Medical History:   Diagnosis Date    AKI (acute kidney injury) ([place]) 01/26/2022    Anxiety     Asthma     Blood circulation, collateral     CAD (coronary artery disease)     CHF (congestive heart failure) ([facility])     Chronic back pain     Deep vein thrombosis     Degeneration of thoracic intervertebral disc 10/15/2017    Depression     GERD (gastroesophageal reflux disease)     NO LONGER SINCE LAP    GERD (gastroesophageal reflux disease) 01/23/2009    Hx of blood clots     Hyperlipidemia     hx; resolved with lap band    Hypertension     Idiopathic peripheral neuropathy 03/26/2017    Melanoma ([place])     upper back    MRSA (methicillin resistant staph aureus) culture positive 10/17/2019    leg abscess    Obesity     hx of; had lap band    Obstructive sleep apnea 09/18/2015    uses bi-pap    Obstructive sleep apnea (adult) (pediatric) 09/18/2015    Pulmonary embolism ([place])     Stenosis of right  carotid artery 06/16/2021    Type 2 diabetes mellitus with diabetic polyneuropathy, without long-term current use of insulin ([place]) 01/26/2022     Past Surgical History:   Procedure Laterality Date    BACK SURGERY      neck  plates    BREAST SURGERY      left lumpectomy    CERVICAL FUSION      CESAREAN SECTION      CHOLECYSTECTOMY      COLONOSCOPY      COLONOSCOPY  04/08/2010    COLONOSCOPY N/A 04/18/2021    COLONOSCOPY DIAGNOSTIC performed by Norwood Levo, MD at [facility] CATH LAB PROCEDURE      DILATATION, ESOPHAGUS      ENDOSCOPY, COLON, DIAGNOSTIC      HYSTERECTOMY (CERVIX STATUS UNKNOWN)      HYSTERECTOMY, VAGINAL      LAP BAND  05/01/2008    Dr. Nicole Cella    OTHER  SURGICAL HISTORY      [place] Filter: curently in place    OTHER SURGICAL HISTORY  07/05/2013    lap band removal    SHOULDER SURGERY      SKIN CANCER EXCISION  12/29/2017    UPPER GASTROINTESTINAL ENDOSCOPY  05/19/2013    UPPER GASTROINTESTINAL ENDOSCOPY  07/21/2013    ESOPHAGEAL STENT PLACEMENT    UPPER GASTROINTESTINAL ENDOSCOPY N/A 07/31/2014    Esophagogastroduodenoscopy with esophageal balloon dilation    UPPER GASTROINTESTINAL ENDOSCOPY N/A 04/18/2021    EGD DILATION BALLOON performed by Norwood Levo, MD at [place]  04/2015    Lucendia Herrlich, celiac artery angiogram, normal abd arteries       Allergies   Allergen Reactions    Ibuprofen     Lisinopril      Memory loss     Seasonal     Talwin [Pentazocine] Other (See Comments)     dizzy     Outpatient Medications Marked as Taking for the 10/14/22 encounter (Office Visit) with Francisca December, MD   Medication Sig Dispense Refill    ketoconazole (NIZORAL) 2 % cream Apply to the rash on the upper back twice daily for 2 weeks. 30 g 2    spironolactone (ALDACTONE) 25 MG tablet Take 1 tablet by mouth daily 30 tablet 3    gabapentin (NEURONTIN) 100 MG capsule Take 1 capsule by mouth nightly.      metFORMIN  (GLUCOPHAGE) 500 MG tablet Take 1 tablet by mouth 2 times daily (with meals)      isosorbide mononitrate (IMDUR) 30 MG extended release tablet Take 1 tablet by mouth daily 90 tablet 3    nitroGLYCERIN (NITROSTAT) 0.4 MG SL tablet up to max of 3 total doses. If no relief after 1 dose, call 911. 25 tablet 3    ranolazine (RANEXA) 500 MG extended release tablet TAKE ONE TABLET BY MOUTH TWICE A DAY 180 tablet 3    fluocinonide (LIDEX) 0.05 % external solution APPLY SPARINGLY TO THE BACK OF THE SCALP DAILY AS NEEDED FOR ITCHING 60 mL 1    clobetasol (TEMOVATE) 0.05 % cream Apply to affected area twice daily for up to 2 weeks or until improved. 60 g 1    dilTIAZem (CARDIZEM CD) 180 MG extended release capsule TAKE ONE CAPSULE BY MOUTH DAILY 90 capsule 3    omeprazole (PRILOSEC) 40 MG delayed release capsule TAKE ONE CAPSULE BY MOUTH DAILY 30 capsule 0    Tirzepatide (MOUNJARO) 5 MG/0.5ML SOPN SC injection once a week      triamcinolone (KENALOG) 0.1 % cream Apply to affected area twice daily for up to 2 weeks or until improved. 15 g 2    topiramate (TOPAMAX) 25 MG tablet Take 2 tablets by mouth 2 times daily      montelukast (SINGULAIR) 10 MG tablet TAKE ONE TABLET BY MOUTH DAILY. **MUST CALL MD FOR APPOINTMENT 30 tablet 5    oxyCODONE-acetaminophen (PERCOCET) 7.5-325 MG per tablet as needed.      rosuvastatin (CRESTOR) 20 MG tablet daily      magnesium (MAGNESIUM-OXIDE) 250 MG TABS tablet Take 350 mg by mouth daily      Coenzyme Q10 (CO Q10) 200 MG CAPS Take by mouth daily (with breakfast)      calcium citrate-vitamin D (CITRICAL + D) 315-250 MG-UNIT TABS per tablet Take 2 tablets by mouth 2 times daily      diclofenac sodium (VOLTAREN) 1 % GEL APPLY  4 GRAMS TO AFFECTED AREA(S) TWO TIMES A DAY 100 g 1    Cholecalciferol (VITAMIN D3) 50 MCG (2000 UT) CAPS Take 1 capsule by mouth daily 90 capsule 3    albuterol sulfate HFA (PROVENTIL HFA) 108 (90 Base) MCG/ACT inhaler Inhale 2 puffs into the lungs every 6 hours as needed  for Wheezing 3 Inhaler 3    lidocaine (LIDODERM) 5 % Place 1 patch onto the skin daily 12 hours on, 12 hours off. (Patient taking differently: Place 1 patch onto the skin as needed 12 hours on, 12 hours off.) 30 patch 0    aspirin 81 MG EC tablet Take 1 tablet by mouth daily 30 tablet 11    Multiple Vitamins-Minerals (MULTIVITAMIN PO) Take 1 tablet by mouth daily            Physical Examination       The following were examined and determined to be normal: Psych/Neuro, Scalp/hair, Conjunctivae/eyelids, Gums/teeth/lips, Neck, Breast/axilla/chest, Abdomen, RUE, LUE, RLE, LLE, and Nails/digits.    The following were examined and determined to be abnormal: Head/face and Back.     Well appearing.    1.  Upper back with few round scaly pink tan patches.     2.  Left concha with mild erythema and scaling.     3.  Trunk and extremities with multiple well defined round to oval smooth brown macules and papules.     4.  Right lateral cheek and right lateral breast with few stuck on appearing tan papules.     5.  Inframammary region and right proximal thigh with few pedunculated pink papules.      6.  Upper back with a linear surgical scar.         Assessment and Plan     1. Tinea versicolor     Ketoconazole 2% cream twice daily until improved.      2. Seborrheic dermatitis of the left ear - mild    Ketoconazole 2% cream twice daily until improved.      3. Multiple nevi - benign appearing    Sun protective behaviors, including use of at least SPF 30 sunscreen, and self skin examinations were encouraged.  Call for any new or concerning lesions.       4. SK (seborrheic keratosis)     Reassurance.      5. Cutaneous skin tags     Reassurance.      6. History of stage IA melanoma of the upper back - no signs of recurrence.     Sun protective behaviors, including use of at least SPF 30 sunscreen, and self skin examinations were encouraged.  Call for any new or concerning lesions.           Return in about 6 months (around  04/14/2023).    --Francisca December, MD

## 2022-12-01 MED ORDER — FLUOCINONIDE 0.05 % EX SOLN
0.05 % | CUTANEOUS | 1 refills | Status: DC
Start: 2022-12-01 — End: 2023-10-14

## 2023-01-04 ENCOUNTER — Encounter: Payer: PRIVATE HEALTH INSURANCE | Attending: Internal Medicine | Primary: Sports Medicine

## 2023-01-28 NOTE — Progress Notes (Signed)
Formatting of this note is different from the original.  Images from the original note were not included.      PROGRESS NOTE    Patient Name: Rachael Mays MRN: [id number]   Age: 67 year old Date of Birth: March 18, 1956   Sex: female        CHIEF COMPLAINT     Chief Complaint   Patient presents with   ? Musculoskeletal Problem     Left heel     HISTORY OF PRESENT ILLNESS   Rachael Mays is a 67 year old female who presents with the complaint of left heel pain. The pain has been present 6 days. she denies to an injury.   - She notes the pain is mostly located at the bottom of her left heel.  -- She states that her pain started on Saturday when she heard a ?popping sound? when she tried to stand up.  -- She has swelling but denies any bruising.  -- She experienced the pain while getting up from her couch and standing up.  -- She has been limping with her foot.  -- She has not had any issues with her feet previously.  -- she  has been taking Percocet for her pain.    ALLERGIES     Allergies   Allergen Reactions   ? Pentazocine Other (See Comments)     dizzy    ? Ibuprofen Unknown     Contraindicated per Nephrologist   ? Lisinopril Confusion, Dizziness and Other (See Comments)   ? Morphine Other (See Comments)   ? Seasonal Ic [Cholestatin] Unknown   ? Talwin [Pentazocine Lactate] Other (See Comments)     hallucinations     MEDICATIONS     Prior to Admission medications    Medication Sig Start Date End Date Taking? Authorizing Provider   isosorbide mononitrate (IMDUR) 30 MG TB24 Take 30 mg by mouth daily. 12/06/22  Yes HISTORICAL MED   fluticasone (FLONASE) 50 mcg/act nasal spray Use 1 spray in each nostril daily. 01/04/23  Yes Maximino Sarin, DO   montelukast (SINGULAIR) 10 MG TABS TAKE 1 TABLET BY MOUTH DAILY 12/22/22  Yes Maximino Sarin, DO   omeprazole (PRILOSEC) 40 MG CPDR TAKE 1 CAPSULE BY MOUTH DAILY 11/16/22  Yes Maximino Sarin, DO   topiramate (TOPAMAX) 50 MG TABS Take 1 tablet by mouth 2 (two) times  daily. 09/21/22  Yes Given, Danice Goltz., CNP   spironolactone (ALDACTONE) 25 MG TABS Take 1 tablet by mouth daily. 08/26/22  Yes Marcell Barlow, CNP   rosuvastatin (CRESTOR) 40 MG TABS TAKE ONE TABLET BY MOUTH ONCE NIGHTLY 08/11/22  Yes Maximino Sarin, DO   famotidine (PEPCID) 20 mg TABS Take 1 tablet by mouth 2 (two) times daily as needed (for Heartburn). 06/30/22  Yes Maximino Sarin, DO   Magnesium 250 MG TABS Take 350 mg by mouth.   Yes HISTORICAL MED   ranolazine (RANEXA) 500 MG TB12 Take 1 tablet by mouth 2 (two) times daily. 02/18/21  Yes Whitis, Freddi Starr., MD   diclofenac (VOLTAREN GEL) 1 % GEL Apply 4 g topically 4 (four) times daily as needed for Pain. 12/23/20  Yes HISTORICAL MED   triamcinolone (KENALOG) 0.1% cream Apply  topically 2 (two) times a day. 02/03/21  Yes HISTORICAL MED   oxycodone-acetaminophen (PERCOCET) 7.5-325 MG TABS Take 1 tablet by mouth every 6 (six) hours as needed (pain).   Yes HISTORICAL MED   cetirizine (ZYRTEC) 10 MG TABS Take 10 mg  by mouth daily.   Yes HISTORICAL MED   aspirin 81 MG TBEC Take 81 mg by mouth daily.   Yes HISTORICAL MED   Coenzyme Q10 (COQ10) 200 MG CAPS Take 200 mg by mouth daily.   Yes HISTORICAL MED   Calcium Citrate-Vitamin D 315-250 MG-UNIT TABS Take 2 tablets by mouth 2 (two) times daily.   Yes HISTORICAL MED   nitroGLYCERIN (NITROSTAT) 0.4 MG SUBL 1 tablet by Sublingual route every 5 (five) minutes as needed. 03/18/20  Yes Ansari, Asimul Haq, MD   tirzepatide [facility]) 2.5 mg/0.5 mL subcutaneous injection pen Inject 2.5 mg under the skin every 7 days. 01/04/23   Maximino Sarin, DO     PAST MEDICAL HISTORY     Past Medical History:   Diagnosis Date   ? Congestive heart failure (CHF) (HCC)    ? Coronary atherosclerosis    ? DVT (deep venous thrombosis) (HCC)    ? Esophageal reflux    ? Hyperlipidemia    ? Hypertension      PAST SURGICAL HISTORY     Past Surgical History:   Procedure Laterality Date   ? BREAST BIOPSY     ? BREAST SURGERY      biopsy  (negative)   ? CERVICAL SPINE SURGERY     ? CHOLECYSTECTOMY WITH/WITHOUT COMMON DUCT EXPLORATION     ? CORONARY ANGIOPLASTY WITH STENT PLACEMENT  03/2019    stent   ? HYSTERECTOMY     ? LAPAROSCOPIC GASTRIC BANDING      placed 2010, removed 2015     SOCIAL HISTORY     Social History     Socioeconomic History   ? Marital status: Divorced   Tobacco Use   ? Smoking status: Former     Types: Cigarettes   ? Smokeless tobacco: Never   ? Tobacco comments:     quit 2013   Substance and Sexual Activity   ? Alcohol use: Not Currently     Comment: on rare occasion   ? Drug use: Never     Social Determinants of Chiropractor    Housing/Utilities     FAMILY HISTORY     Family History   Problem Relation Age of Onset   ? Heart Attack Mother    ? Ovarian cancer Mother    ? Hypertension Mother    ? High Cholesterol Mother    ? Thyroid Disease Sister    ? Diabetes Maternal Grandfather    ? Heart Attack Paternal Grandmother    ? Breast cancer Neg Hx      REVIEW OF SYSTEMS   General: no fever, chills, or malaise  Hematologic: no unexplained bleeding or bruising  HEENT: no nasal congestion, rhinorrhea, sore throat, or facial pain  Respiratory: no cough or dyspnea,   Cardiovascular: No chest pain or palpitations  Gastrointestinal: no nausea, vomiting, diarrhea, constipation, or abdominal pain  Genitourinary: no urinary frequency, dysuria, or hematuria  Musculoskeletal: see HPI   Circulatory: no heat or cold intolerance and no polyphagia, polydipsia, or polyuria  Skin: no skin eruptions or changing lesions  Neurologic: no focal weakness, numbness/tingling, or severe headache    PHYSICAL EXAM   Vital Signs: BP 118/58   Pulse 64   Temp 97.5 F (36.4 C) (Temporal)   Ht 65" (165.1 cm)   SpO2 98%   BMI 50.59 kg/m     Dorsiflexion limited to 80  degrees. Plantar flexion 60 degrees. Medial deviation about 20 degrees. Inversion about 20 degrees, Eversion 15 degrees.     General  appearance: healthy, alert, no distress  Skin: Skin color, texture, turgor normal. No rashes or lesions  HEENT: atraumatic, normocephalic. PERRL  Neuro: Alert and oriented, normal distal sensation, normal bilateral DTRs  Vascular: Normal distal capillary and distal pulses  Musculoskeletal Exam:  Left  Ankle Examination    Inspection: paresthesia at the insertion of the Achilles tendon.    Palpation: Tenderness to palpation over the mid body at Achilles tendon. No tenderness to palpation over the later media ligament of the ankle.     Range of Motion:  Dorsiflexion limited to 80 degrees. Plantar flexion 60 degrees. Medial deviation about 20 degrees. Inversion about 20 degrees, Eversion 15 degrees.     Strength: Plantar flexors rated: 4/5 secondary to pain. Invertors rated: 5/5. Evertors rated: 5/5. Dorsiflexors rated: 5/5.    Special Tests: Thompson's test produces plantar flexion of the ankle. Negative anterior drawer test of ankle.      Left Foot Examination    Palpation: Tenderness to palpation over the fourth metatarsal. Significant tenderness over the origin plantar fascia.    Strength: Strength testing of the foot is 5/5 in all muscle groups tested.     PROCEDURE   N/A    RADIOLOGY     XR ANKLE LEFT AP LATERAL AND OBLIQUE    Result Date: 01/28/2023  HISTORY: No specific injury. Patient heard a pop in ankle on Saturday. Pain in heel.  Pain in left ankle and joints of left foot COMPARISON: None NOTE:  If there are questions about the content of this report, please contact [facility] radiology by calling [phone] FINDINGS:                 BONES:  Unremarkable.  No acute displaced fracture or aggressive osseous lesion JOINTS:  Unremarkable.   No evidence of significant joint space narrowing, spurring, or malalignment SOFT TISSUES:  Unremarkable OTHER:  None       No acute displaced fracture or dislocation.      -Osteophyte projecting off the base of the heel extending towards the forefoot       IMPRESSION     1.  Acute left ankle pain    2. Plantar fasciitis, left        PLAN     Jeanee was seen today for musculoskeletal problem.    Diagnoses and all orders for this visit:    Acute left ankle pain  -     XR ANKLE LEFT AP LATERAL AND OBLIQUE; Future    Plantar fasciitis, left    --She is presenting today with heel pain.  --Her diagnosis is consistent with plantar fasciitis.   --Treatment recommendations were reviewed with the patient.   --She has elected to proceed with a CAM walker boot as she is having significant discomfort with weightbearing and ambulation, Therefore, recommended her to use the walking boot anytime that she is weight bearing.  - Given that she is having even discomfort today in the office with a walking boot on, I have recommended that she progressively wean off of her crutches.   --We will plan to see her back in 4 weeks to monitor her progress considering potentially discontinuing if appropriately indicated.    Education was provided and all questions answered regarding her condition. VERTIE DIBBERN is in agreement with this plan, and had no further questions or concerns. If  any new questions should arise she was instructed to contact our office. Follow up in Return in about 4 weeks (around 02/25/2023) for Sports Medicine.Norville Haggard, D.O.  [facility]  Family Medicine  Sports Medicine  01/28/2023    Please note that some or all of this record was generated using voice recognition software. If there are any questions about the content of this document, please contact the author as some errors in transcription may have occurred.    The patient and family/friends of present, were previous to the use of a scribe through audio recording and all parties consents to conducting the visit in this manner.  I, Dr. Norville Haggard, have reviewed the medical entries provided by the Scribe, and I approve of the care and treatment provided to this patient as recorded in the note.     Maximino Sarin, DO   January 30, 2023  10:54 AM        Electronically signed by Maximino Sarin, DO at 01/30/2023 10:55 AM EST

## 2023-01-28 NOTE — Progress Notes (Signed)
Formatting of this note might be different from the original.  3:57 PM  Left heel "popped" 01/23/23 and has aching pain in heel & going up the back of her leg/calf. Shooting pain when walking  Pain: 7/10 constant    [employer] Consent form signed at front desk    Electronically signed by Hessie Diener, LPN at 16/09/9603 10:55 AM EST

## 2023-01-29 ENCOUNTER — Other Ambulatory Visit (HOSPITAL_BASED_OUTPATIENT_CLINIC_OR_DEPARTMENT_OTHER): Payer: Self-pay | Admitting: Radiology

## 2023-01-29 ENCOUNTER — Other Ambulatory Visit: Payer: Self-pay

## 2023-01-29 ENCOUNTER — Encounter (HOSPITAL_BASED_OUTPATIENT_CLINIC_OR_DEPARTMENT_OTHER): Payer: Self-pay

## 2023-01-29 ENCOUNTER — Emergency Department (HOSPITAL_BASED_OUTPATIENT_CLINIC_OR_DEPARTMENT_OTHER)
Admission: EM | Admit: 2023-01-29 | Discharge: 2023-01-30 | Disposition: A | Discharge status: Home / Self Care | Payer: BC Managed Care – PPO | Attending: Emergency Medicine | Admitting: Emergency Medicine

## 2023-01-29 DIAGNOSIS — E119 Type 2 diabetes mellitus without complications: Secondary | ICD-10-CM | POA: Diagnosis not present

## 2023-01-29 DIAGNOSIS — E049 Nontoxic goiter, unspecified: Secondary | ICD-10-CM | POA: Insufficient documentation

## 2023-01-29 DIAGNOSIS — M25551 Pain in right hip: Secondary | ICD-10-CM | POA: Insufficient documentation

## 2023-01-29 DIAGNOSIS — I10 Essential (primary) hypertension: Secondary | ICD-10-CM | POA: Insufficient documentation

## 2023-01-29 DIAGNOSIS — M25552 Pain in left hip: Secondary | ICD-10-CM

## 2023-01-29 DIAGNOSIS — R0789 Other chest pain: Secondary | ICD-10-CM | POA: Insufficient documentation

## 2023-01-29 DIAGNOSIS — Y92410 Unspecified street and highway as the place of occurrence of the external cause: Secondary | ICD-10-CM | POA: Insufficient documentation

## 2023-01-29 DIAGNOSIS — R519 Headache, unspecified: Secondary | ICD-10-CM | POA: Diagnosis present

## 2023-01-29 HISTORY — DX: Essential (primary) hypertension: I10

## 2023-01-29 HISTORY — DX: Type 2 diabetes mellitus without complications: E11.9

## 2023-01-29 MED ORDER — SPIRONOLACTONE 25 MG PO TABS
25 MG | ORAL_TABLET | Freq: Every day | ORAL | 3 refills | Status: DC
Start: 2023-01-29 — End: 2023-06-04

## 2023-01-29 NOTE — Telephone Encounter (Signed)
Pt called for refill of spironolactone. Refill sent.
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
## 2023-01-30 ENCOUNTER — Emergency Department (HOSPITAL_BASED_OUTPATIENT_CLINIC_OR_DEPARTMENT_OTHER): Payer: BC Managed Care – PPO

## 2023-01-30 MED ORDER — METHOCARBAMOL 500 MG PO TABS: 500.0000 mg | ORAL_TABLET | Freq: Three times a day (TID) | ORAL | 0 refills | Status: AC | PRN

## 2023-01-30 MED ORDER — METHOCARBAMOL 500 MG PO TABS
500.0000 mg | ORAL_TABLET | Freq: Once | ORAL | Status: AC
  Administered 2023-01-30: 500 mg via ORAL
  Filled 2023-01-30: qty 1
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
## 2023-02-01 MED ORDER — DILTIAZEM HCL ER COATED BEADS 180 MG PO CP24
180 MG | ORAL_CAPSULE | ORAL | 3 refills | Status: AC
Start: 2023-02-01 — End: 2023-08-26

## 2023-02-01 NOTE — Telephone Encounter (Signed)
Last ov:06/03/22 [facility]  Next ov:06/04/23 [facility]  Last EKG:06/03/22  Last labs:03/12/22  Last filled:   Disp Refills Start End    dilTIAZem (CARDIZEM CD) 180 MG extended release capsule 90 capsule 3 01/26/2022 --    Sig: TAKE ONE CAPSULE BY MOUTH DAILY    Sent to pharmacy as: dilTIAZem HCl ER Coated Beads 180 MG Oral Capsule Extended Release 24 Hour (CARDIZEM CD)    Cosign for Ordering: Accepted by Leodis Binet, MD on 01/29/2022  2:34 PM    E-Prescribing Status: Receipt confirmed by pharmacy (01/26/2022  4:56 PM EST)

## 2023-02-04 NOTE — Addendum Note (Signed)
Addended by: Fortino Sic on: 02/08/2023 02:10 PM     Modules accepted: Orders      Electronically signed by Myer Peer, DO at 02/08/2023  2:10 PM EST

## 2023-02-04 NOTE — Progress Notes (Signed)
Formatting of this note might be different from the original.  No chief complaint on file.    I have spent a total of 14 minutes on Documenting clinical information in the electronic or other health record.    Electronically signed by Myer Peer, DO at 02/05/2023  5:38 PM EST

## 2023-02-05 NOTE — Progress Notes (Signed)
Formatting of this note might be different from the original.  Images from the original note were not included.  Plan requires a 120 day trial and failure of 3 formulary alternatives, one of which must be a formulary GLP1 (highlighted below) AND inadequate clinical response with at least 2 preferred drugs concomitantly (as noted below) with an A1C >7% within the past 6 months.    Please note, plan specifies that for non-preferred drugs that have preferred drugs in the same drug class, the 120 day trial and failure of the preferred medication is required to be due to inadequate clinical response.     Plan defines an inadequate clinical response as A1C >7% after at least 120 days of current regimen, with use of 2 or more drugs concomitantly per ADA guidelines, documented adherence, and appropriate dose escalation (must achieve maximum recommended dose or document that maximum recommended dose is not tolerated or is clinically inappropriate).     Formulary alternatives are given by plan, and include:     Assessment    Patient does not meet the requirements for coverage of Ozempic or Mounjaro.  Most recent A1c 01/04/23 = 5.7.    Recommendation:    Note:  Last fill of Victoza 1.'8mg'$  daily on 12/01/22 (day supply 30).    Formulary GLP1 medication, start at lowest dose and retitrate.    Carmelia Roller, PharmD  [facility] Pharmacist  [phone]  02/05/2023  Referral Source: Physician - Medication Affordability      Electronically signed by Carmelia Roller, PharmD at 02/05/2023 11:20 AM EST

## 2023-02-08 ENCOUNTER — Ambulatory Visit
Admit: 2023-02-08 | Discharge: 2023-02-08 | Payer: PRIVATE HEALTH INSURANCE | Attending: Internal Medicine | Primary: Sports Medicine

## 2023-02-08 DIAGNOSIS — I10 Essential (primary) hypertension: Secondary | ICD-10-CM

## 2023-02-08 MED ORDER — TORSEMIDE 20 MG PO TABS
20 MG | ORAL_TABLET | Freq: Every day | ORAL | 0 refills | Status: DC
Start: 2023-02-08 — End: 2023-04-12

## 2023-02-08 NOTE — Patient Instructions (Signed)
Please avoid NSAIDs medications  - e.g. Advil, motrin, ibuprofen, aleve, diclofenac, mobic, celebrex, nabumetone.etc: they can harm your kidneys!     [place] work now and before next appointment

## 2023-02-08 NOTE — Progress Notes (Signed)
Office : [phone]     Fax :[phone]      Nephrology progress Note        History of Present Illness:    This is a 67 y.o. female who presents to the office for evaluation of  worsening renal function .  H/o DM 2 - none   H/o HTN - now BP better after loss of weight   H/o Renal stones - none   H/o renal disease in family   H/o CAD - h/o Stent in past. H/o CHF   CHF?     H/o smoking .    Today visit 02/08/23    No recent labs   She will have blood work done soon     Hemoglobin A1C   Date Value Ref Range Status   07/09/2021 7.2 % Final       Recently stopped lisinopril .   Pt is on spironolactone  , Dilt, Imdur, Torsemide     Previous labs:   Cr 1.3- 1.5 9/23  <-- 1.6 7/23<--1.4 4/23<--1. 3/23<--1.3<--2.0 (2/23) <--1.8 1/23<--1.4 8/22<--1.1 3/22<-- normal 2021 <--base line 1.0-1.1     GFR 44  Lytes ok   UPCR 0.07  UACR - neg 7/23  UA- no RBC WBC   HCV neg   CT ABD 5/23: KIDNEYS/URETERS:  Right renal cyst.  No hydronephrosis, stone, or suspicious mass . ADRENAL LESION  Mri 9/23: 1.3 cm right adrenal nodule demonstrates signal characteristics consistent with an adenoma.   S/p CTPA 2/22  Echo [id number]:    systolic function with an estimated ejection fraction of 55-60%.   No regional wall motion abnormalities are seen.   Normal diastolic function.E/e"=11.35.    BP here 130/60  BP at home 120s/60s   No usage of NSAIDS   Diuretcis dose was reduced and Lisinopril stopped     Pt denies CP/SOB/palpitations/abdominal pain/N/V.   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    Depression     GERD (gastroesophageal reflux disease)     NO LONGER SINCE LAP    GERD (gastroesophageal reflux disease) 01/23/2009    Hx of blood clots     Hyperlipidemia     hx; resolved with lap band    Hypertension     Idiopathic peripheral neuropathy 03/26/2017    Melanoma ([place])     upper back    MRSA (methicillin resistant staph aureus) culture positive 10/17/2019    leg abscess    Obesity     hx of; had lap band    Obstructive sleep apnea 09/18/2015    uses bi-pap    Obstructive sleep apnea (adult) (pediatric) 09/18/2015    Pulmonary embolism ([place])     Stenosis of right carotid artery 06/16/2021    Type 2 diabetes mellitus with diabetic polyneuropathy, without long-term current use of insulin ([place]) 01/26/2022  Past Surgical History:        Procedure Laterality Date    BACK SURGERY      neck  plates    BREAST SURGERY      left lumpectomy    CERVICAL FUSION      CESAREAN SECTION      CHOLECYSTECTOMY      COLONOSCOPY      COLONOSCOPY  04/08/2010    COLONOSCOPY N/A 04/18/2021    COLONOSCOPY DIAGNOSTIC performed by Norwood Levo, MD at [place] CATH LAB PROCEDURE      DILATATION, ESOPHAGUS      ENDOSCOPY, COLON, DIAGNOSTIC      HYSTERECTOMY (CERVIX STATUS UNKNOWN)      HYSTERECTOMY, VAGINAL      LAP BAND  05/01/2008    Dr. Nicole Cella    OTHER SURGICAL HISTORY      [place] Filter: curently in place    OTHER SURGICAL HISTORY  07/05/2013    lap band removal    SHOULDER SURGERY      SKIN CANCER EXCISION  12/29/2017    UPPER GASTROINTESTINAL ENDOSCOPY  05/19/2013    UPPER GASTROINTESTINAL ENDOSCOPY  07/21/2013    ESOPHAGEAL STENT PLACEMENT    UPPER GASTROINTESTINAL ENDOSCOPY N/A 07/31/2014    Esophagogastroduodenoscopy with esophageal balloon dilation    UPPER GASTROINTESTINAL ENDOSCOPY N/A 04/18/2021    EGD DILATION BALLOON performed by Norwood Levo, MD at [place]  04/2015    Lucendia Herrlich,  celiac artery angiogram, normal abd arteries       Current Medications:    Current Outpatient Medications   Medication Sig Dispense Refill    dilTIAZem (CARDIZEM CD) 180 MG extended release capsule TAKE ONE CAPSULE BY MOUTH DAILY 90 capsule 3    spironolactone (ALDACTONE) 25 MG tablet TAKE 1 TABLET BY MOUTH DAILY 30 tablet 3    fluocinonide (LIDEX) 0.05 % external solution APPLY SPARINGLY TO THE BACK OF THE SCALP DAILY AS NEEDED FOR ITCHING 60 mL 1    ketoconazole (NIZORAL) 2 % cream Apply to the rash on the upper back twice daily for 2 weeks. 30 g 2    gabapentin (NEURONTIN) 100 MG capsule Take 1 capsule by mouth nightly.      metFORMIN (GLUCOPHAGE) 500 MG tablet Take 1 tablet by mouth 2 times daily (with meals)      isosorbide mononitrate (IMDUR) 30 MG extended release tablet Take 1 tablet by mouth daily 90 tablet 3    nitroGLYCERIN (NITROSTAT) 0.4 MG SL tablet up to max of 3 total doses. If no relief after 1 dose, call 911. 25 tablet 3    ranolazine (RANEXA) 500 MG extended release tablet TAKE ONE TABLET BY MOUTH TWICE A DAY 180 tablet 3    clobetasol (TEMOVATE) 0.05 % cream Apply to affected area twice daily for up to 2 weeks or until improved. 60 g 1    omeprazole (PRILOSEC) 40 MG delayed release capsule TAKE ONE CAPSULE BY MOUTH DAILY 30 capsule 0    triamcinolone (KENALOG) 0.1 % cream Apply to affected area twice daily for up to 2 weeks or until improved. 15 g 2    topiramate (TOPAMAX) 25 MG tablet Take 2 tablets by mouth 2 times daily      montelukast (SINGULAIR) 10 MG tablet TAKE ONE TABLET BY MOUTH DAILY. **MUST CALL MD FOR APPOINTMENT 30 tablet 5    oxyCODONE-acetaminophen (PERCOCET)  7.5-325 MG per tablet as needed.      rosuvastatin (CRESTOR) 20 MG tablet daily      magnesium (MAGNESIUM-OXIDE) 250 MG TABS tablet Take 350 mg by mouth daily      Coenzyme Q10 (CO Q10) 200 MG CAPS Take by mouth daily (with breakfast)      calcium citrate-vitamin D (CITRICAL + D) 315-250 MG-UNIT TABS per tablet Take 2 tablets  by mouth 2 times daily      diclofenac sodium (VOLTAREN) 1 % GEL APPLY 4 GRAMS TO AFFECTED AREA(S) TWO TIMES A DAY 100 g 1    Cholecalciferol (VITAMIN D3) 50 MCG (2000 UT) CAPS Take 1 capsule by mouth daily 90 capsule 3    albuterol sulfate HFA (PROVENTIL HFA) 108 (90 Base) MCG/ACT inhaler Inhale 2 puffs into the lungs every 6 hours as needed for Wheezing 3 Inhaler 3    lidocaine (LIDODERM) 5 % Place 1 patch onto the skin daily 12 hours on, 12 hours off. (Patient taking differently: Place 1 patch onto the skin as needed 12 hours on, 12 hours off.) 30 patch 0    aspirin 81 MG EC tablet Take 1 tablet by mouth daily 30 tablet 11    Multiple Vitamins-Minerals (MULTIVITAMIN PO) Take 1 tablet by mouth daily       torsemide (DEMADEX) 10 MG tablet Take 1 tablet by mouth daily 90 tablet 2     No current facility-administered medications for this visit.       Allergies:  Ibuprofen, Lisinopril, Seasonal, and Talwin [pentazocine]    Social History:   Social History     Socioeconomic History    Marital status: Divorced     Spouse name: [name]    Number of children: 2    Years of education: 12    Highest education level: Not on file   Occupational History    Not on file   Tobacco Use    Smoking status: Former     Current packs/day: 0.00     Average packs/day: 0.5 packs/day for 40.0 years (20.0 ttl pk-yrs)     Types: Cigarettes     Start date: 08/07/1972     Quit date: 08/07/2012     Years since quitting: 10.5    Smokeless tobacco: Former     Quit date: 06/16/2013    Tobacco comments:     started to smoke at age 89 / only smoked 0.5 p.p.d    Vaping Use    Vaping Use: Never used   Substance and Sexual Activity    Alcohol use: No     Alcohol/week: 0.0 standard drinks of alcohol    Drug use: No    Sexual activity: Not Currently   Other Topics Concern    Not on file   Social History Narrative    Works as Scientist, water quality at [employer]: [place]  (05/15/2020)    Overall Financial  Resource Strain (CARDIA)     Difficulty of Paying Living Expenses: Not hard at all   Food Insecurity: No Food Insecurity (05/15/2020)    Hunger Vital Sign     Worried About Running Out of Food in the Last Year: Never true     Ran Out of Food in the Last Year: Never true   Transportation Needs: Not on file   Physical Activity: Not on file   Stress: Not on file  Social Connections: Not on file   Intimate Partner Violence: Not on file   Housing Stability: Not on file       Family History:   Family History   Problem Relation Age of Onset    Arthritis Mother     Cancer Mother     Depression Mother     High Blood Pressure Mother     Heart Disease Mother 12        MI     Ovarian Cancer Mother 69    Cancer Father 79        colon    Heart Disease Maternal Grandmother     Early Death Paternal Grandmother     Heart Disease Paternal Grandfather     Diabetes Other     High Blood Pressure Other     Obesity Other     Other Sister         OSA    Other Brother         OSA    Other Brother         OSA    Other Daughter         OSA       Review of Systems:    Constitutional: No fever, no chills, no lethargy, no weakness.  HEENT:  No headache,  sore throat.  Cardiac:  No chest pain, dyspnea, orthopnea or PND.  Chest:              No cough, phlegm or wheezing.  Abdomen:  No abdominal pain, nausea or vomiting.  Neuro:                No focal weakness, abnormal movements orseizure like activity.  Skin:   No rashes, no itching.  GU:   No hematuria,, no dysuria, no flank pain.  Extremities:  No swelling       Objective:  BP 131/60   Pulse 63   Wt 136.1 kg (300 lb)   BMI 49.92 kg/m     Physical Exam:  General appearance:Awake, alert, in no acute distress  Skin: warm and dry, no rash or erythema    Eyes: conjunctivae normal and sclera anicteric    ENT: :no thrush no pharyngeal congestion      Neck: without any JVD. No carotid bruits or thyromegaly.      Pulmonary: b/l rales     Cardiovascular:Normal S1 & S2, No S3 or  S4, No  Pericardial  rub , No Murmur     Abdomen: soft nontender, bowel sounds present, no organomegaly,  No ascites      Extremities: no cyanosis, +1 b/l pitting edema    Labs:    CBC:   Lab Results   Component Value Date/Time    WBC 8.3 10/14/2021 08:40 AM    RBC 4.16 10/14/2021 08:40 AM    HGB 12.3 10/14/2021 08:40 AM    HCT 37.1 10/14/2021 08:40 AM    MCV 89.2 10/14/2021 08:40 AM    RDW 14.0 10/14/2021 08:40 AM    PLT 218 10/14/2021 08:40 AM    MPV 8.4 10/14/2021 08:40 AM      BMP:   Lab Results   Component Value Date/Time    NA 143 03/12/2022 01:40 PM    NA 145 02/27/2022 03:32 PM    NA 143 02/18/2022 04:20 PM    K 4.7 03/12/2022 01:40 PM    K 4.5 02/27/2022 03:32 PM    K 4.1 02/18/2022 04:20 PM  K 4.6 10/17/2019 06:07 AM    K 3.9 05/05/2017 03:21 AM    CL 107 03/12/2022 01:40 PM    CL 108 02/27/2022 03:32 PM    CL 107 02/18/2022 04:20 PM    CO2 21 03/12/2022 01:40 PM    CO2 24 02/27/2022 03:32 PM    CO2 25 02/18/2022 04:20 PM    BUN 18 03/12/2022 01:40 PM    BUN 20 02/27/2022 03:32 PM    BUN 17 02/18/2022 04:20 PM    CREATININE 1.4 03/12/2022 01:40 PM    CREATININE 1.4 02/27/2022 03:32 PM    CREATININE 1.3 02/18/2022 04:20 PM    GLUCOSE 129 03/12/2022 01:40 PM    GLUCOSE 123 02/27/2022 03:32 PM    GLUCOSE 101 02/18/2022 04:20 PM    CALCIUM 9.5 03/12/2022 01:40 PM    CALCIUM 9.9 02/27/2022 03:32 PM    CALCIUM 9.6 02/18/2022 04:20 PM      BNP:No results found for: "BNP"  PHOSPHORUS:    Lab Results   Component Value Date/Time    PHOS 3.8 08/21/2013 03:46 PM    PHOS 3.3 08/18/2013 08:13 AM    PHOS 3.5 08/17/2013 06:49 AM     MAGNESIUM:   Lab Results   Component Value Date/Time    MG 2.20 05/07/2017 04:45 AM     ALBUMIN:   Lab Results   Component Value Date/Time    LABALBU 4.0 02/18/2022 04:30 PM     IRON:    Lab Results   Component Value Date/Time    IRON 93 03/26/2010 05:35 PM     IRON SATURATION:    No components found for: "LABIRON"    TIBC:    Lab Results   Component Value Date/Time    TIBC 397 03/26/2010 05:35 PM     FERRITIN:     Lab Results   Component Value Date/Time    FERRITIN 61.9 05/02/2009 05:40 PM     ANA:   Lab Results   Component Value Date    ANA Negative 07/26/2019       SPEP:   Lab Results   Component Value Date/Time    PROT 6.8 02/18/2022 04:30 PM    PROT 7.3 03/31/2011 03:07 PM    LABALPH 0.2 03/29/2017 03:33 PM    LABALPH 0.7 03/29/2017 03:33 PM    GAMGLOB 1.2 03/29/2017 03:33 PM     UPEP: No results found for: "TPU"   HEPBSAG:No results found for: "HEPBSAG"  HEPCAB:No results found for: "HEPCAB"  C3: No results found for: "C3"  C4: No results found for: "C4"  MPO ANCA: No results found for: "MPO" .  PR3 ANCA:  No results found for: "PR3"  PTH: No results found for: "PTH"    Urine Creatinine:    Lab Results   Component Value Date/Time    LABCREA 241.1 02/13/2021 03:04 PM     Urine Eosinophils: No results found for: "UREO"  Urine Protein:  No results found for: "TPU"  Urinalysis:  U/A:   Lab Results   Component Value Date/Time    NITRU NEGATIVE 02/13/2021 03:03 PM    COLORU Yellow 02/13/2021 03:03 PM    PHUR 5.5 11/25/2017 04:24 PM    LABCAST 0-1 Hyaline 07/09/2013 02:09 AM    WBCUA 4 07/16/2014 07:26 PM    RBCUA NEGATIVE 02/13/2021 03:03 PM    MUCUS PRESENT 02/13/2021 03:03 PM    YEAST 0 11/25/2017 04:24 PM    BACTERIA 0 11/25/2017 04:24 PM  BACTERIA 2+ 07/16/2014 07:26 PM    CLARITYU Clear 02/13/2021 03:03 PM    SPECGRAV 1.025 02/13/2021 03:03 PM    LEUKOCYTESUR NEGATIVE 02/13/2021 03:03 PM    LEUKOCYTESUR 0 to 5 02/13/2021 03:03 PM    UROBILINOGEN 2+ (4-6 mg/dL) 02/13/2021 03:03 PM    BILIRUBINUR NEGATIVE 02/13/2021 03:03 PM    BLOODU Negative 11/25/2017 04:24 PM    GLUCOSEU NEGATIVE 02/13/2021 03:03 PM    KETUA NEGATIVE 02/13/2021 03:03 PM         Radiology:  Reviewed as available.    Assessment/Plan:    CKD stage 3B sus CRS  Creat 1.3-1.5 slightly  better - need supdated labs   No proteinuria   Torsemide 10 mg with edema. --> increasing to 20 mg   Off  Metfrmin   FLC SPEP- normal   Plan:  No need for RAASi/ SGLT-2 for  now   Recheck labs niw and before next appt      H/o CHF but EF improved, CAD  Still edematous   Resumed  spironolactone   Keep  torsemide --> increase to 20 mg po daily-      H/o adrenal nodule . CT scan in 2020  Check CT abdomen to see the status of adrenal nodule - pt will schedule CT scan   Repeat  mri- BENIGN     HTN   BP reasonable   Spironolactone renewed   Increasing Torsemide for edema   Discussed low salt diet  No NSAIDS  Keep current meds  Recheck BP at PCP office today      Discussed with patient in detail.   Counseling done for diet and lifestyle changes.  Explained importance of compliance with medications     Laurence Slate, MD  Nephrology     [facility] Office : [address] , [place] [address]  [facility] Office: [address] [place], [place] [address]  [facility] Office: [address], [place] [address].  [facility] Office: [address] [place] [address]  Office : [phone]  Fax :[phone]     Discussed w/ MD- Metformin discontinued

## 2023-03-31 ENCOUNTER — Inpatient Hospital Stay: Admit: 2023-03-31 | Payer: PRIVATE HEALTH INSURANCE | Primary: Sports Medicine

## 2023-03-31 ENCOUNTER — Emergency Department: Admit: 2023-03-31 | Payer: PRIVATE HEALTH INSURANCE | Primary: Sports Medicine

## 2023-03-31 ENCOUNTER — Inpatient Hospital Stay
Admission: EM | Admit: 2023-03-31 | Discharge: 2023-04-06 | Disposition: A | Discharge status: Home / Self Care | Payer: PRIVATE HEALTH INSURANCE | Admitting: Internal Medicine

## 2023-03-31 DIAGNOSIS — R079 Chest pain, unspecified: Secondary | ICD-10-CM

## 2023-03-31 DIAGNOSIS — I509 Heart failure, unspecified: Secondary | ICD-10-CM

## 2023-03-31 LAB — URINALYSIS WITH REFLEX TO CULTURE
Bilirubin Urine: NEGATIVE
Blood, Urine: NEGATIVE
Glucose, Ur: NEGATIVE mg/dL
Ketones, Urine: NEGATIVE mg/dL
Leukocyte Esterase, Urine: NEGATIVE
Nitrite, Urine: NEGATIVE
Protein, UA: NEGATIVE mg/dL
Specific Gravity, UA: 1.01 (ref 1.005–1.030)
Urobilinogen, Urine: 1 E.U./dL (ref <2.0)
pH, UA: 7 (ref 5.0–8.0)

## 2023-03-31 LAB — COMPREHENSIVE METABOLIC PANEL
ALT: 16 U/L (ref 10–40)
AST: 21 U/L (ref 15–37)
Albumin/Globulin Ratio: 1.3 (ref 1.1–2.2)
Albumin: 4.2 g/dL (ref 3.4–5.0)
Alkaline Phosphatase: 100 U/L (ref 40–129)
Anion Gap: 12 (ref 3–16)
BUN: 21 mg/dL — ABNORMAL HIGH (ref 7–20)
CO2: 25 mmol/L (ref 21–32)
Calcium: 10 mg/dL (ref 8.3–10.6)
Chloride: 101 mmol/L (ref 99–110)
Creatinine: 1.4 mg/dL — ABNORMAL HIGH (ref 0.6–1.2)
Est, Glom Filt Rate: 42 — AB (ref >60)
Glucose: 114 mg/dL — ABNORMAL HIGH (ref 70–99)
Potassium: 4.2 mmol/L (ref 3.5–5.1)
Sodium: 138 mmol/L (ref 136–145)
Total Bilirubin: 0.5 mg/dL (ref 0.0–1.0)
Total Protein: 7.4 g/dL (ref 6.4–8.2)

## 2023-03-31 LAB — CBC WITH AUTO DIFFERENTIAL
Basophils %: 0.8 %
Basophils Absolute: 0.1 10*3/uL (ref 0.0–0.2)
Eosinophils %: 3.3 %
Eosinophils Absolute: 0.2 10*3/uL (ref 0.0–0.6)
Hematocrit: 41.5 % (ref 36.0–48.0)
Hemoglobin: 13.5 g/dL (ref 12.0–16.0)
Lymphocytes %: 34 %
Lymphocytes Absolute: 2.5 10*3/uL (ref 1.0–5.1)
MCH: 28.5 pg (ref 26.0–34.0)
MCHC: 32.5 g/dL (ref 31.0–36.0)
MCV: 87.6 fL (ref 80.0–100.0)
MPV: 9.1 fL (ref 5.0–10.5)
Monocytes %: 11.1 %
Monocytes Absolute: 0.8 10*3/uL (ref 0.0–1.3)
Neutrophils %: 50.8 %
Neutrophils Absolute: 3.7 10*3/uL (ref 1.7–7.7)
Platelets: 187 10*3/uL (ref 135–450)
RBC: 4.74 M/uL (ref 4.00–5.20)
RDW: 15.2 % (ref 12.4–15.4)
WBC: 7.2 10*3/uL (ref 4.0–11.0)

## 2023-03-31 LAB — TROPONIN
Troponin, High Sensitivity: 15 ng/L — ABNORMAL HIGH (ref 0–14)
Troponin, High Sensitivity: 16 ng/L — ABNORMAL HIGH (ref 0–14)

## 2023-03-31 LAB — BRAIN NATRIURETIC PEPTIDE: Pro-BNP: 65 pg/mL (ref 0–124)

## 2023-03-31 LAB — PROTIME-INR
INR: 1.06 (ref 0.85–1.15)
Protime: 14.1 s (ref 11.9–14.9)

## 2023-03-31 LAB — D-DIMER, QUANTITATIVE: D-Dimer, Quant: 0.94 ug/mL FEU — ABNORMAL HIGH (ref 0.00–0.60)

## 2023-03-31 LAB — APTT: aPTT: 27.8 s (ref 22.1–36.4)

## 2023-03-31 MED ORDER — TORSEMIDE 20 MG PO TABS
20 MG | Freq: Every day | ORAL | Status: AC
Start: 2023-03-31 — End: 2023-04-06
  Administered 2023-04-01: 13:00:00 20 mg via ORAL

## 2023-03-31 MED ORDER — HYDRALAZINE HCL 20 MG/ML IJ SOLN
20 MG/ML | Freq: Four times a day (QID) | INTRAMUSCULAR | Status: AC | PRN
Start: 2023-03-31 — End: 2023-04-06

## 2023-03-31 MED ORDER — ACETAMINOPHEN 325 MG PO TABS
325 | Freq: Four times a day (QID) | ORAL | Status: DC | PRN
Start: 2023-03-31 — End: 2023-04-05
  Administered 2023-04-04: 01:00:00 650 mg via ORAL

## 2023-03-31 MED ORDER — OYSTER SHELL CALCIUM W/D 500-5 MG-MCG PO TABS
500-5- MG-MCG | Freq: Two times a day (BID) | ORAL | Status: DC
Start: 2023-03-31 — End: 2023-04-06
  Administered 2023-04-01 – 2023-04-06 (×12): 2 via ORAL

## 2023-03-31 MED ORDER — OXYCODONE-ACETAMINOPHEN 7.5-325 MG PO TABS
Freq: Once | ORAL | Status: AC
Start: 2023-03-31 — End: 2023-03-31
  Administered 2023-03-31: 21:00:00 1 via ORAL

## 2023-03-31 MED ORDER — NORMAL SALINE FLUSH 0.9 % IV SOLN
0.9 % | Freq: Two times a day (BID) | INTRAVENOUS | Status: AC
Start: 2023-03-31 — End: 2023-04-06
  Administered 2023-04-01 – 2023-04-06 (×7): 10 mL via INTRAVENOUS

## 2023-03-31 MED ORDER — MAGNESIUM OXIDE -MG SUPPLEMENT 400 (240 MG) MG PO TABS
400240 (240 Mg) MG | Freq: Every day | ORAL | Status: AC
Start: 2023-03-31 — End: 2023-04-06
  Administered 2023-04-01 – 2023-04-06 (×6): 400 mg via ORAL

## 2023-03-31 MED ORDER — SODIUM CHLORIDE 0.9 % IV BOLUS
0.9 % | INTRAVENOUS | Status: AC | PRN
Start: 2023-03-31 — End: 2023-04-06

## 2023-03-31 MED ORDER — GLUCAGON HCL (DIAGNOSTIC) 1 MG IJ SOLR
1 | INTRAMUSCULAR | Status: DC | PRN
Start: 2023-03-31 — End: 2023-03-31

## 2023-03-31 MED ORDER — POTASSIUM BICARB-CITRIC ACID 20 MEQ PO TBEF
20 | ORAL | Status: DC | PRN
Start: 2023-03-31 — End: 2023-03-31

## 2023-03-31 MED ORDER — INSULIN LISPRO 100 UNIT/ML IJ SOLN
100 UNIT/ML | Freq: Three times a day (TID) | INTRAMUSCULAR | Status: AC
Start: 2023-03-31 — End: 2023-04-06

## 2023-03-31 MED ORDER — ENOXAPARIN SODIUM 30 MG/0.3ML IJ SOSY
300.3 MG/0.3ML | Freq: Two times a day (BID) | INTRAMUSCULAR | Status: AC
Start: 2023-03-31 — End: 2023-04-06
  Administered 2023-04-01 – 2023-04-06 (×9): 30 mg via SUBCUTANEOUS

## 2023-03-31 MED ORDER — VITAMIN D 25 MCG (1000 UT) PO TABS
251000 MCG (1000 UT) | Freq: Every day | ORAL | Status: AC
Start: 2023-03-31 — End: 2023-04-06
  Administered 2023-04-01 – 2023-04-06 (×6): 2000 [IU] via ORAL

## 2023-03-31 MED ORDER — ALBUTEROL SULFATE HFA 108 (90 BASE) MCG/ACT IN AERS
10890 (90 Base) MCG/ACT | Freq: Four times a day (QID) | RESPIRATORY_TRACT | Status: AC | PRN
Start: 2023-03-31 — End: 2023-04-06

## 2023-03-31 MED ORDER — POTASSIUM CHLORIDE 10 MEQ/100ML IV SOLN
10100 MEQ/0ML | INTRAVENOUS | Status: AC | PRN
Start: 2023-03-31 — End: 2023-04-06

## 2023-03-31 MED ORDER — POTASSIUM CHLORIDE CRYS ER 20 MEQ PO TBCR
20 MEQ | ORAL | Status: AC | PRN
Start: 2023-03-31 — End: 2023-04-06

## 2023-03-31 MED ORDER — PERFLUTREN LIPID MICROSPHERE IV SUSP
Freq: Once | INTRAVENOUS | Status: AC | PRN
Start: 2023-03-31 — End: 2023-04-01
  Administered 2023-04-01: 12:00:00 1.5 mL via INTRAVENOUS

## 2023-03-31 MED ORDER — MAGNESIUM SULFATE 2000 MG/50 ML IVPB PREMIX
250 GM/50ML | INTRAVENOUS | Status: AC | PRN
Start: 2023-03-31 — End: 2023-04-06

## 2023-03-31 MED ORDER — FUROSEMIDE 10 MG/ML IJ SOLN
10 MG/ML | Freq: Three times a day (TID) | INTRAMUSCULAR | Status: AC
Start: 2023-03-31 — End: 2023-04-01
  Administered 2023-04-01: 13:00:00 40 mg via INTRAVENOUS

## 2023-03-31 MED ORDER — SPIRONOLACTONE 25 MG PO TABS
25 MG | Freq: Every day | ORAL | Status: AC
Start: 2023-03-31 — End: 2023-04-06
  Administered 2023-04-01: 13:00:00 25 mg via ORAL

## 2023-03-31 MED ORDER — NITROGLYCERIN 0.4 MG SL SUBL
0.4 MG | SUBLINGUAL | Status: AC | PRN
Start: 2023-03-31 — End: 2023-04-06

## 2023-03-31 MED ORDER — POTASSIUM CHLORIDE CRYS ER 20 MEQ PO TBCR
20 | ORAL | Status: DC | PRN
Start: 2023-03-31 — End: 2023-03-31

## 2023-03-31 MED ORDER — ASPIRIN 81 MG PO TBEC
81 MG | Freq: Every day | ORAL | Status: AC
Start: 2023-03-31 — End: 2023-04-06
  Administered 2023-04-01 – 2023-04-06 (×6): 81 mg via ORAL

## 2023-03-31 MED ORDER — IOPAMIDOL 76 % IV SOLN
76 | Freq: Once | INTRAVENOUS | Status: AC | PRN
Start: 2023-03-31 — End: 2023-03-31
  Administered 2023-03-31: 22:00:00 75 mL via INTRAVENOUS

## 2023-03-31 MED ORDER — DEXTROSE 10 % IV SOLN
10 % | INTRAVENOUS | Status: AC | PRN
Start: 2023-03-31 — End: 2023-04-06

## 2023-03-31 MED ORDER — LIDOCAINE 4 % EX PTCH
4 | Freq: Every day | CUTANEOUS | Status: DC
Start: 2023-03-31 — End: 2023-03-31

## 2023-03-31 MED ORDER — ROSUVASTATIN CALCIUM 40 MG PO TABS
40 MG | Freq: Every day | ORAL | Status: AC
Start: 2023-03-31 — End: 2023-04-06
  Administered 2023-04-01 – 2023-04-06 (×6): 40 mg via ORAL

## 2023-03-31 MED ORDER — GLUCOSE-VITAMIN C 4-6 GM-MG PO CHEW
4-6 | ORAL | Status: DC | PRN
Start: 2023-03-31 — End: 2023-03-31

## 2023-03-31 MED ORDER — ONDANSETRON HCL 4 MG/2ML IJ SOLN
42 MG/2ML | Freq: Four times a day (QID) | INTRAMUSCULAR | Status: AC | PRN
Start: 2023-03-31 — End: 2023-04-06
  Administered 2023-04-01 – 2023-04-02 (×4): 4 mg via INTRAVENOUS

## 2023-03-31 MED ORDER — ACETAMINOPHEN 650 MG RE SUPP
650 | Freq: Four times a day (QID) | RECTAL | Status: DC | PRN
Start: 2023-03-31 — End: 2023-04-06

## 2023-03-31 MED ORDER — ONDANSETRON 4 MG PO TBDP
4 MG | Freq: Three times a day (TID) | ORAL | Status: AC | PRN
Start: 2023-03-31 — End: 2023-04-06

## 2023-03-31 MED ORDER — DILTIAZEM HCL ER COATED BEADS 180 MG PO CP24
180 MG | Freq: Every day | ORAL | Status: AC
Start: 2023-03-31 — End: 2023-04-06
  Administered 2023-04-01 – 2023-04-06 (×6): 180 mg via ORAL

## 2023-03-31 MED ORDER — POTASSIUM CHLORIDE 10 MEQ/100ML IV SOLN
10 | INTRAVENOUS | Status: DC | PRN
Start: 2023-03-31 — End: 2023-03-31

## 2023-03-31 MED ORDER — INSULIN LISPRO 100 UNIT/ML IJ SOLN
100 UNIT/ML | Freq: Every evening | INTRAMUSCULAR | Status: AC
Start: 2023-03-31 — End: 2023-04-06

## 2023-03-31 MED ORDER — OXYCODONE-ACETAMINOPHEN 7.5-325 MG PO TABS
7.5-325 MG | Freq: Three times a day (TID) | ORAL | Status: AC | PRN
Start: 2023-03-31 — End: 2023-04-01
  Administered 2023-04-01 (×2): 1 via ORAL

## 2023-03-31 MED ORDER — RANOLAZINE ER 500 MG PO TB12
500 MG | Freq: Two times a day (BID) | ORAL | Status: DC
Start: 2023-03-31 — End: 2023-04-06
  Administered 2023-04-01 – 2023-04-06 (×12): 500 mg via ORAL

## 2023-03-31 MED ORDER — DEXTROSE 10 % IV BOLUS
INTRAVENOUS | Status: AC | PRN
Start: 2023-03-31 — End: 2023-04-06

## 2023-03-31 MED ORDER — MAGNESIUM HYDROXIDE 400 MG/5ML PO SUSP
4005 MG/5ML | Freq: Every day | ORAL | Status: AC | PRN
Start: 2023-03-31 — End: 2023-04-06

## 2023-03-31 MED ORDER — ISOSORBIDE MONONITRATE ER 30 MG PO TB24
30 MG | Freq: Every day | ORAL | Status: AC
Start: 2023-03-31 — End: 2023-04-06
  Administered 2023-04-01 – 2023-04-06 (×6): 30 mg via ORAL

## 2023-03-31 MED ORDER — POTASSIUM BICARB-CITRIC ACID 20 MEQ PO TBEF
20 MEQ | ORAL | Status: AC | PRN
Start: 2023-03-31 — End: 2023-04-06

## 2023-03-31 MED ORDER — PANTOPRAZOLE SODIUM 40 MG PO TBEC
40 MG | Freq: Every day | ORAL | Status: AC
Start: 2023-03-31 — End: 2023-04-06
  Administered 2023-04-01 – 2023-04-06 (×6): 40 mg via ORAL

## 2023-03-31 MED ORDER — GABAPENTIN 300 MG PO CAPS
300 MG | Freq: Every evening | ORAL | Status: AC
Start: 2023-03-31 — End: 2023-04-06
  Administered 2023-04-01 – 2023-04-06 (×6): 300 mg via ORAL

## 2023-03-31 MED ORDER — FUROSEMIDE 10 MG/ML IJ SOLN
10 | Freq: Once | INTRAMUSCULAR | Status: AC
Start: 2023-03-31 — End: 2023-03-31
  Administered 2023-03-31: 21:00:00 40 mg via INTRAVENOUS

## 2023-03-31 MED ORDER — SODIUM CHLORIDE 0.9 % IV SOLN
0.9 % | INTRAVENOUS | Status: DC | PRN
Start: 2023-03-31 — End: 2023-04-06

## 2023-03-31 MED ORDER — TOPIRAMATE 25 MG PO TABS
25 | Freq: Two times a day (BID) | ORAL | Status: DC
Start: 2023-03-31 — End: 2023-03-31

## 2023-03-31 MED ORDER — NORMAL SALINE FLUSH 0.9 % IV SOLN
0.9 % | INTRAVENOUS | Status: AC | PRN
Start: 2023-03-31 — End: 2023-04-06
  Administered 2023-04-01: 22:00:00 10 mL via INTRAVENOUS

## 2023-03-31 MED ORDER — MONTELUKAST SODIUM 10 MG PO TABS
10 MG | Freq: Every evening | ORAL | Status: AC
Start: 2023-03-31 — End: 2023-04-06
  Administered 2023-04-02 – 2023-04-06 (×5): 10 mg via ORAL

## 2023-03-31 MED FILL — OXYCODONE-ACETAMINOPHEN 7.5-325 MG PO TABS: ORAL | Qty: 1

## 2023-03-31 MED FILL — PROAIR HFA 108 (90 BASE) MCG/ACT IN AERS: 108 (90 Base) MCG/ACT | RESPIRATORY_TRACT | Qty: 8.5

## 2023-03-31 MED FILL — FUROSEMIDE 10 MG/ML IJ SOLN: 10 MG/ML | INTRAMUSCULAR | Qty: 4

## 2023-03-31 NOTE — Progress Notes (Signed)
03/31/23 1909   RT Protocol   History Pulmonary Disease 1   Respiratory pattern 0   Breath sounds 2   Cough 0   Indications for Bronchodilator Therapy On home bronchodilators   Bronchodilator Assessment Score 3

## 2023-03-31 NOTE — ED Provider Notes (Signed)
[facility] EMERGENCY DEPARTMENT  EMERGENCY DEPARTMENT ENCOUNTER        Pt Name: Rachael Mays  MRN: [id number]  Birthdate Feb 18, 1956  Date of evaluation: 03/31/2023  Provider: Patrcia Dolly, PA-C  PCP: Maximino Sarin, DO  Note Started: 3:43 PM EDT 03/31/23      APP. I have evaluated this patient.        CHIEF COMPLAINT       Chief Complaint   Patient presents with    Shortness of Breath     Pt reports trouble breathing onset Saturday. Pt has hx of CHF. Denies chest pain in triage.        HISTORY OF PRESENT ILLNESS: 1 or more Elements     History from : Patient    Limitations to history : None    Rachael Mays is a 67 y.o. female who presents to the emergency department complaining of shortness of breath since Saturday.  She has had 1 week of intermittent dizziness.  She feels very fatigued.  Shortness of breath is worse when she lays flat and when she mildly exerts herself.  She has no chest pain, palpitations, hemoptysis, abdominal pain or distention.    Nursing Notes were all reviewed and agreed with or any disagreements were addressed in the HPI.    REVIEW OF SYSTEMS :      Review of Systems   Constitutional:  Positive for fatigue and unexpected weight change. Negative for chills and fever.   HENT: Negative.     Eyes:  Negative for visual disturbance.   Respiratory:  Positive for shortness of breath. Negative for cough, wheezing and stridor.    Cardiovascular:  Positive for leg swelling. Negative for chest pain and palpitations.   Gastrointestinal:  Negative for abdominal pain, constipation, diarrhea, nausea and vomiting.   Endocrine: Negative.    Genitourinary: Negative.    Musculoskeletal:  Negative for back pain, neck pain and neck stiffness.   Skin:  Negative for color change, pallor, rash and wound.   Neurological:  Positive for dizziness. Negative for tremors, seizures, syncope, facial asymmetry, speech difficulty, weakness, numbness and headaches.   Psychiatric/Behavioral:   Negative for confusion.    All other systems reviewed and are negative.      Positives and Pertinent negatives as per HPI.     SURGICAL HISTORY     Past Surgical History:   Procedure Laterality Date    BACK SURGERY      neck  plates    BREAST SURGERY      left lumpectomy    CERVICAL FUSION      CESAREAN SECTION      CHOLECYSTECTOMY      COLONOSCOPY      COLONOSCOPY  04/08/2010    COLONOSCOPY N/A 04/18/2021    COLONOSCOPY DIAGNOSTIC performed by Kandice Robinsons, MD at [facility] ENDOSCOPY    CORONARY ANGIOPLASTY      DIAGNOSTIC CARDIAC CATH LAB PROCEDURE      DILATATION, ESOPHAGUS      ENDOSCOPY, COLON, DIAGNOSTIC      HYSTERECTOMY (CERVIX STATUS UNKNOWN)      HYSTERECTOMY, VAGINAL      LAP BAND  05/01/2008    Dr. Suzi Roots    OTHER SURGICAL HISTORY      [place] Filter: curently in place    OTHER SURGICAL HISTORY  07/05/2013    lap band removal    SHOULDER SURGERY      SKIN CANCER EXCISION  12/29/2017  UPPER GASTROINTESTINAL ENDOSCOPY  05/19/2013    UPPER GASTROINTESTINAL ENDOSCOPY  07/21/2013    ESOPHAGEAL STENT PLACEMENT    UPPER GASTROINTESTINAL ENDOSCOPY N/A 07/31/2014    Esophagogastroduodenoscopy with esophageal balloon dilation    UPPER GASTROINTESTINAL ENDOSCOPY N/A 04/18/2021    EGD DILATION BALLOON performed by Kandice Robinsons, MD at [facility] ENDOSCOPY    VASCULAR SURGERY  04/2015    Donzetta Sprung, celiac artery angiogram, normal abd arteries       CURRENTMEDICATIONS       Previous Medications    ALBUTEROL SULFATE HFA (PROVENTIL HFA) 108 (90 BASE) MCG/ACT INHALER    Inhale 2 puffs into the lungs every 6 hours as needed for Wheezing    ASPIRIN 81 MG EC TABLET    Take 1 tablet by mouth daily    CALCIUM CITRATE-VITAMIN D (CITRICAL + D) 315-250 MG-UNIT TABS PER TABLET    Take 2 tablets by mouth 2 times daily    CHOLECALCIFEROL (VITAMIN D3) 50 MCG (2000 UT) CAPS    Take 1 capsule by mouth daily    CLOBETASOL (TEMOVATE) 0.05 % CREAM    Apply to affected area twice daily for up to 2 weeks or until improved.    COENZYME  Q10 (CO Q10) 200 MG CAPS    Take by mouth daily (with breakfast)    DICLOFENAC SODIUM (VOLTAREN) 1 % GEL    APPLY 4 GRAMS TO AFFECTED AREA(S) TWO TIMES A DAY    DILTIAZEM (CARDIZEM CD) 180 MG EXTENDED RELEASE CAPSULE    TAKE ONE CAPSULE BY MOUTH DAILY    FLUOCINONIDE (LIDEX) 0.05 % EXTERNAL SOLUTION    APPLY SPARINGLY TO THE BACK OF THE SCALP DAILY AS NEEDED FOR ITCHING    GABAPENTIN (NEURONTIN) 100 MG CAPSULE    Take 1 capsule by mouth nightly.    ISOSORBIDE MONONITRATE (IMDUR) 30 MG EXTENDED RELEASE TABLET    Take 1 tablet by mouth daily    KETOCONAZOLE (NIZORAL) 2 % CREAM    Apply to the rash on the upper back twice daily for 2 weeks.    LIDOCAINE (LIDODERM) 5 %    Place 1 patch onto the skin daily 12 hours on, 12 hours off.    MAGNESIUM (MAGNESIUM-OXIDE) 250 MG TABS TABLET    Take 350 mg by mouth daily    METFORMIN (GLUCOPHAGE) 500 MG TABLET    Take 1 tablet by mouth 2 times daily (with meals)    MONTELUKAST (SINGULAIR) 10 MG TABLET    TAKE ONE TABLET BY MOUTH DAILY. **MUST CALL MD FOR APPOINTMENT    MULTIPLE VITAMINS-MINERALS (MULTIVITAMIN PO)    Take 1 tablet by mouth daily     NITROGLYCERIN (NITROSTAT) 0.4 MG SL TABLET    up to max of 3 total doses. If no relief after 1 dose, call 911.    OMEPRAZOLE (PRILOSEC) 40 MG DELAYED RELEASE CAPSULE    TAKE ONE CAPSULE BY MOUTH DAILY    OXYCODONE-ACETAMINOPHEN (PERCOCET) 7.5-325 MG PER TABLET    as needed.    RANOLAZINE (RANEXA) 500 MG EXTENDED RELEASE TABLET    TAKE ONE TABLET BY MOUTH TWICE A DAY    ROSUVASTATIN (CRESTOR) 20 MG TABLET    daily    SPIRONOLACTONE (ALDACTONE) 25 MG TABLET    TAKE 1 TABLET BY MOUTH DAILY    TOPIRAMATE (TOPAMAX) 25 MG TABLET    Take 2 tablets by mouth 2 times daily    TORSEMIDE (DEMADEX) 20 MG TABLET    Take 1 tablet by  mouth daily    TRIAMCINOLONE (KENALOG) 0.1 % CREAM    Apply to affected area twice daily for up to 2 weeks or until improved.       ALLERGIES     Ibuprofen, Lisinopril, Seasonal, and Talwin [pentazocine]    FAMILYHISTORY        Family History   Problem Relation Age of Onset    Arthritis Mother     Cancer Mother     Depression Mother     High Blood Pressure Mother     Heart Disease Mother 44        MI     Ovarian Cancer Mother 88    Cancer Father 82        colon    Heart Disease Maternal Grandmother     Early Death Paternal Grandmother     Heart Disease Paternal Grandfather     Diabetes Other     High Blood Pressure Other     Obesity Other     Other Sister         OSA    Other Brother         OSA    Other Brother         OSA    Other Daughter         OSA        SOCIAL HISTORY       Social History     Tobacco Use    Smoking status: Former     Current packs/day: 0.00     Average packs/day: 0.5 packs/day for 40.0 years (20.0 ttl pk-yrs)     Types: Cigarettes     Start date: 08/07/1972     Quit date: 08/07/2012     Years since quitting: 10.6    Smokeless tobacco: Former     Quit date: 06/16/2013    Tobacco comments:     started to smoke at age 67 / only smoked 0.5 p.p.d    Vaping Use    Vaping Use: Never used   Substance Use Topics    Alcohol use: No     Alcohol/week: 0.0 standard drinks of alcohol    Drug use: No       SCREENINGS   NIH Stroke Scale  Interval: Baseline  Level of Consciousness (1a): Alert  LOC Questions (1b): Answers both correctly  LOC Commands (1c): Performs both tasks correctly  Best Gaze (2): Normal  Visual (3): No visual loss  Facial Palsy (4): Normal symmetrical movement  Motor Arm, Left (5a): No drift  Motor Arm, Right (5b): No drift  Motor Leg, Left (6a): No drift  Motor Leg, Right (6b): No drift  Limb Ataxia (7): Absent  Sensory (8): Normal  Best Language (9): No aphasia  Dysarthria (10): Normal  Extinction and Inattention (11): No abnormality  Total: 0                     CIWA Assessment  BP: (!) 157/88  Pulse: 68           PHYSICAL EXAM  1 or more Elements     ED Triage Vitals [03/31/23 1533]   BP Temp Temp Source Pulse Respirations SpO2 Height Weight - Scale   (!) 157/88 98.9 F (37.2 C) Oral 68 18 98 % -- 133.8 kg  (295 lb)       Physical Exam  Vitals and nursing note reviewed.   Constitutional:       Appearance: Normal  appearance. She is well-developed. She is obese. She is not toxic-appearing or diaphoretic.   HENT:      Head: Normocephalic and atraumatic.      Right Ear: External ear normal.      Left Ear: External ear normal.      Nose: Nose normal.      Mouth/Throat:      Mouth: Mucous membranes are moist.      Pharynx: Oropharynx is clear.   Eyes:      General:         Right eye: No discharge.         Left eye: No discharge.      Extraocular Movements: Extraocular movements intact.      Conjunctiva/sclera: Conjunctivae normal.      Pupils: Pupils are equal, round, and reactive to light.   Cardiovascular:      Rate and Rhythm: Normal rate.   Pulmonary:      Effort: Pulmonary effort is normal.      Breath sounds: No stridor. No rhonchi.   Abdominal:      General: Bowel sounds are normal.      Palpations: Abdomen is soft.      Tenderness: There is no abdominal tenderness.   Musculoskeletal:         General: Normal range of motion.      Cervical back: Normal range of motion.      Comments: Trace symmetrical pretibial edema without posterior calf or thigh tenderness or palpable cord.  Negative Homans   Skin:     General: Skin is warm and dry.      Capillary Refill: Capillary refill takes less than 2 seconds.      Coloration: Skin is not jaundiced or pale.      Findings: No bruising, erythema, lesion or rash.   Neurological:      General: No focal deficit present.      Mental Status: She is alert and oriented to person, place, and time.   Psychiatric:         Behavior: Behavior normal.             DIAGNOSTIC RESULTS   LABS:    Labs Reviewed   COMPREHENSIVE METABOLIC PANEL - Abnormal; Notable for the following components:       Result Value    Glucose 114 (*)     BUN 21 (*)     Creatinine 1.4 (*)     Est, Glom Filt Rate 42 (*)     All other components within normal limits   TROPONIN - Abnormal; Notable for the following  components:    Troponin, High Sensitivity 15 (*)     All other components within normal limits   CBC WITH AUTO DIFFERENTIAL   APTT   PROTIME-INR   BRAIN NATRIURETIC PEPTIDE   URINALYSIS WITH REFLEX TO CULTURE   TROPONIN       When ordered only abnormal lab results are displayed. All other labs were within normal range or not returned as of this dictation.    EKG: When ordered, EKG's are interpreted by the Emergency Department Physician in the absence of a cardiologist.  Please see their note for interpretation of EKG.    RADIOLOGY:   Non-plain film images such as CT, Ultrasound and MRI are read by the radiologist. Plain radiographic images are visualized and preliminarily interpreted by me with the below findings:    Mild pulmonary vascular congestion on cxr    Interpretation per  the Radiologist below, if available at the time of this note:    XR CHEST PORTABLE   Final Result   No acute process.             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     EMERGENCY DEPARTMENT COURSE and DIFFERENTIAL DIAGNOSIS/MDM:   Vitals:    Vitals:    03/31/23 1533   BP: (!) 157/88   Pulse: 68   Resp: 18   Temp: 98.9 F (37.2 C)   TempSrc: Oral   SpO2: 98%   Weight: 133.8 kg (295 lb)       Patient was given the following medications:  Medications   furosemide (LASIX) injection 40 mg (has no administration in time range)             Is this patient to be included in the SEP-1 Core Measure due to severe sepsis or septic shock?   No   Exclusion criteria - the patient is NOT to be included for SEP-1 Core Measure due to:  Infection is not suspected    CONSULTS: (Who and What was discussed)  IP CONSULT TO INTERNAL MEDICINE  Discussion with Other Profesionals : Admitting Team Dr Cathie Hoops paged for admission [phone]    Social Determinants : None    Records Reviewed : Outpatient Labs & Studies echocardiogram 06/26/22    CC/HPI Summary, DDx, ED Course, and Reassessment: This patient emergency department complaining of weight gain, bilateral lower extremity edema, shortness of breath while laying flat and with exertion.  She denies chest pain but does report feeling fatigued and dizzy at times.  NIH scale is 0.  I did order a dose of Lasix for peripheral edema and reported shortness of breath.  She does have a history of CHF and this may be consistent with CHF exacerbation.  She is not tachypneic, tachycardic or hypoxic.  Her initial troponin is mildly elevated.  Second troponin is pending.  Patient understands and agrees with plan for admission.   My suspicion is low for STEMI, PE, myocarditis, pericarditis, endocarditis, pleural effusion, pericardial effusion, cardiac tamponade,  thoracic aortic dissection, esophageal rupture, other life-threatening arrhythmia, hypertensive urgency or emergency, hemothorax, pulmonary contusion, subcutaneous emphysema, flail chest, pneumo mediastinum, rib fracture, pneumonia, pneumothorax, COVID-19, pertussis, RSV, influenza, ARDS, severe asthma  exacerbation, severe COPD exacerbation or other concerning pathology.      Disposition Considerations (include 1 Tests not done, Shared Decision Making, Pt Expectation of Test or Tx.): admit         I am the Primary Clinician of Record.    FINAL IMPRESSION      1. Acute on chronic congestive heart failure, unspecified heart failure type (  HCC)    2. Shortness of breath    3. Dizziness    4. Elevated troponin          DISPOSITION/PLAN     DISPOSITION Decision To Admit 03/31/2023 04:32:47 PM      PATIENT REFERRED TO:  No follow-up provider specified.    DISCHARGE MEDICATIONS:  New Prescriptions    No medications on file       DISCONTINUED MEDICATIONS:  Discontinued Medications    No medications on file              (Please note that portions of this note were completed with a voice recognition program.  Efforts were made to edit the dictations but occasionally words are mis-transcribed.)    Patrcia Dolly, PA-C (electronically signed)            Patrcia Dolly, PA-C  03/31/23 1636

## 2023-03-31 NOTE — ED Notes (Addendum)
How does patient ambulate?   Low Fall Risk (ambulates by themselves without support)  Stand by assist   Contact Guard   Front wheel walker  Wheelchair   Steady  Bed bound  History of Lower Extremity Amputation  Unknown, did not assess in the emergency department   How does patient take pills?  Whole with Water  Crushed in applesauce  Crushed in pudding  Other  Unknown no oral medications were given in the ED  Is patient alert?   Alert  Drowsy but responds to voice  Doesn't respond to voice but responds to painful stimuli  Unresponsive  Is patient oriented?   To person  To place  To time  To situation  Confused  Agitated  Follows commands  If patient is disoriented or from a Skill Nursing Facility has family been notified of admission?   Yes   No  Patient belongings?   Cell phone  Wallet   Dentures  Clothing  Any specific patient or family belongings/needs/dynamics?   No  Miscellaneous comments/pending orders?  No     If there are any additional questions please reach out to the Emergency Department.

## 2023-03-31 NOTE — H&P (Incomplete)
History and Physical  Dr. Cathie Hoops  03/31/2023    PCP: Maximino Sarin, DO    Cc:   Chief Complaint   Patient presents with    Shortness of Breath     Pt reports trouble breathing onset Saturday. Pt has hx of CHF. Denies chest pain in triage.        HPI:  Rachael Mays is a 67 y.o. {Desc; female/female:11659} who has a past medical history of AKI (acute kidney injury) (HCC), Anxiety, Asthma, Blood circulation, collateral, CAD (coronary artery disease), CHF (congestive heart failure) (HCC), Chronic back pain, Deep vein thrombosis, Degeneration of thoracic intervertebral disc, Depression, GERD (gastroesophageal reflux disease), GERD (gastroesophageal reflux disease), Hx of blood clots, Hyperlipidemia, Hypertension, Idiopathic peripheral neuropathy, Melanoma (HCC), MRSA (methicillin resistant staph aureus) culture positive, Obesity, Obstructive sleep apnea, Obstructive sleep apnea (adult) (pediatric), Pulmonary embolism (HCC), Stenosis of right carotid artery, and Type 2 diabetes mellitus with diabetic polyneuropathy, without long-term current use of insulin (HCC).     Patient presents with <principal problem not specified>.  HPI  (1-3 for expanded problem focused, ?4 for detailed/comprehensive)     ***    ***    Problem list of hospitalization thus far:  Active Hospital Problems    Diagnosis     Dyslipidemia [E78.5]      Priority: High    Chronic diastolic congestive heart failure (HCC) [I50.32]      Priority: High    AKI (acute kidney injury) (HCC) [N17.9]      Priority: Medium    Essential hypertension [I10]     Morbid obesity with body mass index (BMI) of 45.0 to 49.9 in adult (HCC) [E66.01, Z68.42]          Review of Systems: (1 system for EPF, 2-9 for detailed, 10+ for comprehensive)  Constitutional: Negative for chills and fever.  ***  HENT: Negative for dental problem, nosebleeds and rhinorrhea.   ***  Eyes: Negative for photophobia and visual disturbance.  ***  Respiratory: Negative for cough, chest tightness and  shortness of breath.   ***  Cardiovascular: Negative for chest pain and leg swelling.  ***  Gastrointestinal: Negative for diarrhea, nausea and vomiting.  ***  Endocrine: Negative for polydipsia and polyphagia.  ***  Genitourinary: Negative for frequency, hematuria and urgency.  ***  Musculoskeletal: Negative for back pain and myalgias.  ***  Skin: Negative for rash.  ***  Allergic/Immunologic: Negative for food allergies.  ***  Neurological: Negative for dizziness, seizures, syncope and facial asymmetry.  ***  Hematological: Negative for adenopathy.  ***  Psychiatric/Behavioral: Negative for dysphoric mood. The patient is not nervous/anxious.   ***    ***    Past Medical History:   Past Medical History:   Diagnosis Date    AKI (acute kidney injury) (HCC) 01/26/2022    Anxiety     Asthma     Blood circulation, collateral     CAD (coronary artery disease)     CHF (congestive heart failure) (HCC)     Chronic back pain     Deep vein thrombosis     Degeneration of thoracic intervertebral disc 10/15/2017    Depression     GERD (gastroesophageal reflux disease)     NO LONGER SINCE LAP    GERD (gastroesophageal reflux disease) 01/23/2009    Hx of blood clots     Hyperlipidemia     hx; resolved with lap band    Hypertension     Idiopathic peripheral neuropathy 03/26/2017  Melanoma (HCC)     upper back    MRSA (methicillin resistant staph aureus) culture positive 10/17/2019    leg abscess    Obesity     hx of; had lap band    Obstructive sleep apnea 09/18/2015    uses bi-pap    Obstructive sleep apnea (adult) (pediatric) 09/18/2015    Pulmonary embolism (HCC)     Stenosis of right carotid artery 06/16/2021    Type 2 diabetes mellitus with diabetic polyneuropathy, without long-term current use of insulin (HCC) 01/26/2022       Past Surgical History:   Past Surgical History:   Procedure Laterality Date    BACK SURGERY      neck  plates    BREAST SURGERY      left lumpectomy    CERVICAL FUSION      CESAREAN SECTION       CHOLECYSTECTOMY      COLONOSCOPY      COLONOSCOPY  04/08/2010    COLONOSCOPY N/A 04/18/2021    COLONOSCOPY DIAGNOSTIC performed by Kandice Robinsons, MD at [facility] ENDOSCOPY    CORONARY ANGIOPLASTY      DIAGNOSTIC CARDIAC CATH LAB PROCEDURE      DILATATION, ESOPHAGUS      ENDOSCOPY, COLON, DIAGNOSTIC      HYSTERECTOMY (CERVIX STATUS UNKNOWN)      HYSTERECTOMY, VAGINAL      LAP BAND  05/01/2008    Dr. Suzi Roots    OTHER SURGICAL HISTORY      [place] Filter: curently in place    OTHER SURGICAL HISTORY  07/05/2013    lap band removal    SHOULDER SURGERY      SKIN CANCER EXCISION  12/29/2017    UPPER GASTROINTESTINAL ENDOSCOPY  05/19/2013    UPPER GASTROINTESTINAL ENDOSCOPY  07/21/2013    ESOPHAGEAL STENT PLACEMENT    UPPER GASTROINTESTINAL ENDOSCOPY N/A 07/31/2014    Esophagogastroduodenoscopy with esophageal balloon dilation    UPPER GASTROINTESTINAL ENDOSCOPY N/A 04/18/2021    EGD DILATION BALLOON performed by Kandice Robinsons, MD at [facility] ENDOSCOPY    VASCULAR SURGERY  04/2015    Donzetta Sprung, celiac artery angiogram, normal abd arteries       Social History:   Social History       Tobacco History       Smoking Status  Former Smoking Start Date  08/07/1972 Quit Date  08/07/2012 Average Packs/Day  0.5 packs/day for 40.0 years (20.0 ttl pk-yrs) Smoking Tobacco Type  Cigarettes from 08/07/1972 to 08/07/2012   Pack Year History     Packs/Day From To Years    0 08/07/2012  10.6    0.5 08/07/1972 08/07/2012 40.0      Smokeless Tobacco Use  Former Quit Date  06/16/2013      Tobacco Comments  started to smoke at age 36 / only smoked 0.5 p.p.d               Alcohol History       Alcohol Use Status  No              Drug Use       Drug Use Status  No              Sexual Activity       Sexually Active  Not Currently                    Fam History:   Family History   Problem  Relation Age of Onset    Arthritis Mother     Cancer Mother     Depression Mother     High Blood Pressure Mother     Heart Disease Mother 60        MI     Ovarian Cancer  Mother 72    Cancer Father 29        colon    Heart Disease Maternal Grandmother     Early Death Paternal Grandmother     Heart Disease Paternal Grandfather     Diabetes Other     High Blood Pressure Other     Obesity Other     Other Sister         OSA    Other Brother         OSA    Other Brother         OSA    Other Daughter         OSA       PFSH: The above PMHx, PSHx, SocHx, FamHx {HAS HAS ZOX:09604} been reviewed by myself. (1 area for detailed, 2-3 for comprehensive)      Code Status: Prior    Meds - following list of home medications fromelectronic chart has been reviewed by myself  Prior to Admission medications    Medication Sig Start Date End Date Taking? Authorizing Provider   torsemide (DEMADEX) 20 MG tablet Take 1 tablet by mouth daily 02/08/23 05/09/23  Orlev-Shitrit, Mechele Dawley, MD   dilTIAZem (CARDIZEM CD) 180 MG extended release capsule TAKE ONE CAPSULE BY MOUTH DAILY 02/01/23   Court Joy, MD   spironolactone (ALDACTONE) 25 MG tablet TAKE 1 TABLET BY MOUTH DAILY 01/29/23   Orlev-Shitrit, Mechele Dawley, MD   fluocinonide (LIDEX) 0.05 % external solution APPLY SPARINGLY TO THE BACK OF THE SCALP DAILY AS NEEDED FOR ITCHING 12/01/22   Ronelle Nigh, MD   ketoconazole (NIZORAL) 2 % cream Apply to the rash on the upper back twice daily for 2 weeks. 10/14/22   Ronelle Nigh, MD   gabapentin (NEURONTIN) 100 MG capsule Take 1 capsule by mouth nightly. 07/14/22   [provider]   metFORMIN (GLUCOPHAGE) 500 MG tablet Take 1 tablet by mouth 2 times daily (with meals)    [provider]   isosorbide mononitrate (IMDUR) 30 MG extended release tablet Take 1 tablet by mouth daily 06/03/22   Court Joy, MD   nitroGLYCERIN (NITROSTAT) 0.4 MG SL tablet up to max of 3 total doses. If no relief after 1 dose, call 911. 06/03/22   Court Joy, MD   ranolazine (RANEXA) 500 MG extended release tablet TAKE ONE TABLET BY MOUTH TWICE A DAY 05/21/22   Court Joy, MD   clobetasol (TEMOVATE) 0.05 % cream  Apply to affected area twice daily for up to 2 weeks or until improved. 04/13/22   Ronelle Nigh, MD   omeprazole (PRILOSEC) 40 MG delayed release capsule TAKE ONE CAPSULE BY MOUTH DAILY 01/26/22   Wojciechowski, Hale Bogus, APRN - NP   triamcinolone (KENALOG) 0.1 % cream Apply to affected area twice daily for up to 2 weeks or until improved. 10/13/21   Ronelle Nigh, MD   topiramate (TOPAMAX) 25 MG tablet Take 2 tablets by mouth 2 times daily    [provider]   montelukast (SINGULAIR) 10 MG tablet TAKE ONE TABLET BY MOUTH DAILY. **MUST CALL MD FOR APPOINTMENT 09/02/21   Wojciechowski, Hale Bogus, APRN -  NP   oxyCODONE-acetaminophen (PERCOCET) 7.5-325 MG per tablet as needed. 06/18/21   [provider]   rosuvastatin (CRESTOR) 20 MG tablet daily 04/21/21   [provider]   magnesium (MAGNESIUM-OXIDE) 250 MG TABS tablet Take 350 mg by mouth daily    [provider]   Coenzyme Q10 (CO Q10) 200 MG CAPS Take by mouth daily (with breakfast)    [provider]   calcium citrate-vitamin D (CITRICAL + D) 315-250 MG-UNIT TABS per tablet Take 2 tablets by mouth 2 times daily    [provider]   diclofenac sodium (VOLTAREN) 1 % GEL APPLY 4 GRAMS TO AFFECTED AREA(S) TWO TIMES A DAY 12/23/20   Fanny Dance, MD   Cholecalciferol (VITAMIN D3) 50 MCG (2000 UT) CAPS Take 1 capsule by mouth daily 08/13/20   Wojciechowski, Hale Bogus, APRN - NP   albuterol sulfate HFA (PROVENTIL HFA) 108 (90 Base) MCG/ACT inhaler Inhale 2 puffs into the lungs every 6 hours as needed for Wheezing 07/13/19   Ulitzsch, Kearney Hard, APRN - CNP   lidocaine (LIDODERM) 5 % Place 1 patch onto the skin daily 12 hours on, 12 hours off.  Patient taking differently: Place 1 patch onto the skin as needed 12 hours on, 12 hours off. 04/05/19   Karrie Doffing, Gypsy Lore, PA-C   aspirin 81 MG EC tablet Take 1 tablet by mouth daily 02/16/17   Nadyne Coombes, Dutch Gray, MD   Multiple Vitamins-Minerals (MULTIVITAMIN PO)  Take 1 tablet by mouth daily     [provider]         Allergies   Allergen Reactions    Ibuprofen     Lisinopril      Memory loss     Seasonal     Talwin [Pentazocine] Other (See Comments)     dizzy             EXAM: (2-7 system for EPF/Detailed, ?8 for Comprehensive)  BP (!) 157/88   Pulse 68   Temp 98.9 F (37.2 C) (Oral)   Resp 18   Wt 133.8 kg (295 lb)   SpO2 98%   BMI 49.09 kg/m   Constitutional: vitals as above: alert, appears stated age and cooperative ***  Psychiatric: normal insight and judgment, oriented to person, place, time, and general circumstances ***  Head: Normocephalic, without obvious abnormality, atraumatic ***  Eyes:lids and lashes normal, conjunctivae and sclerae normal and pupils equal, round, reactive to light and accomodation ***  EMNT: external ears normal, nares midline ***  Neck: no carotid bruit, supple, symmetrical, trachea midline and thyroid not enlarged, symmetric, no tenderness/mass/nodules  ***  Respiratory: clear to auscultation and percussion bilaterally with normal respiratory effort ***  Cardiovascular: normal rate, regular rhythm, normal S1 and S2 and no murmurs ***  Gastrointestinal: soft, non-tender, non-distended, normal bowel sounds, no masses or organomegaly ***  Extremities: no clubbing, no edema ***  Skin:No rashes or nodules noted. ***  Neurologic:negative ***  ***    LABS:  Labs Reviewed   COMPREHENSIVE METABOLIC PANEL - Abnormal; Notable for the following components:       Result Value    Glucose 114 (*)     BUN 21 (*)     Creatinine 1.4 (*)     Est, Glom Filt Rate 42 (*)     All other components within normal limits   TROPONIN - Abnormal; Notable for the following components:    Troponin, High Sensitivity 15 (*)  All other components within normal limits   CBC WITH AUTO DIFFERENTIAL   APTT   PROTIME-INR   BRAIN NATRIURETIC PEPTIDE   URINALYSIS WITH REFLEX TO CULTURE   TROPONIN         IMAGING:  Imaging results from computerized chart.  Any  imaging that has been independently reviewed as noted elsewhere in chart.  XR CHEST PORTABLE    Result Date: 03/31/2023  EXAMINATION: ONE XRAY VIEW OF THE CHEST 03/31/2023 3:37 pm COMPARISON: None. HISTORY: ORDERING SYSTEM PROVIDED HISTORY: SOB TECHNOLOGIST PROVIDED HISTORY: Reason for exam:->SOB Reason for Exam: shortness of breath and chest pressure FINDINGS: The lungs are without acute focal process.  There is no effusion or pneumothorax. The cardiomediastinal silhouette is without acute process. The osseous structures are without acute process.     No acute process.         EKG:   EKG interpreted by self - it shows ***  Old chart reviewed, EKG dated *** is reviewed, there {IS/IS NOT:19932::"is not"} difference noted.    Old study shows ***    Lab Results   Component Value Date/Time    GLUCOSE 114 03/31/2023 04:01 PM     Lab Results   Component Value Date/Time    POCGLU 125 05/04/2014 12:11 AM     BP (!) 157/88   Pulse 68   Temp 98.9 F (37.2 C) (Oral)   Resp 18   Wt 133.8 kg (295 lb)   SpO2 98%   BMI 49.09 kg/m     MEDICAL DECISION MAKING:    Active Problems:    Chronic diastolic congestive heart failure (HCC)  Plan: ***    Dyslipidemia  Plan: ***    AKI (acute kidney injury) (HCC)  Plan: ***    Morbid obesity with body mass index (BMI) of 45.0 to 49.9 in adult [facility])  Plan: ***    Essential hypertension  Plan: ***          Diagnoses as listed above, designated as new or established and plan outlined for each.      Case discussed with: ***    MDM Determination    PROBLEM  {yumdmproblem:54658}  PLUS  {yumdmplus:54659}    OR TIME BASED  {yumdmtime:54660::"N/A - see problem based determination above"}           The patient is being placed in inpatient status with the expectation of requiring a hospital stay spanning at least two midnights for care and treatment of the problems noted in the problem list.  This determination is also based on thepatients comorbidities and past medical history, the severity and  timing of the signs and symptoms upon presentation.    (Please note that portions of this note were completed with a voice recognition program.  Efforts were made to edit the dictations but occasionally words are mis-transcribed.)      Electronically signed by: Barb Merino, MD 03/31/2023    Please use [employer] to contact me with any questions during the day.   The hospitalist service will provide cross-coverage for this patient from 7pm to 7am.    During those hours, contact the on-call hospitalist MD/APP for questions.

## 2023-03-31 NOTE — ED Notes (Signed)
Patient transported to unit by ER tech in stable condition on bedside monitor.

## 2023-03-31 NOTE — Telephone Encounter (Signed)
Formatting of this note might be different from the original.  Patient calling   [phone] (home) [phone] (work) [phone] (Mobile)     States x 4 days her swelling in ankles and feet has become worse. Had recent dose increase on Torsemide to . Also taking Spironolactone as directed.     Today she is experiencing dyspnea and dizziness. Has felt as if she would pass out multiple times. Resistant to ED but writer advised she call and speak with Cardiology and to seek medical attention at ED.     Patient agreed and will go to ED. Family members are aware and will be checking on her until able to get treatment.   Electronically signed by Carlton Adam, LPN at 57/84/6962 10:57 AM EDT

## 2023-03-31 NOTE — H&P (Addendum)
HOSPITALISTS HISTORY AND PHYSICAL    03/31/2023 5:00 PM    Patient Information:  Rachael Mays is a 67 y.o. female [id number]  PCP:  Maximino Sarin, DO (Tel: [phone] )    Chief complaint:    Chief Complaint   Patient presents with    Shortness of Breath     Pt reports trouble breathing onset Saturday. Pt has hx of CHF. Denies chest pain in triage.        History of Present Illness:  Rachael Mays is a 67 y.o. female who presents to the hospital with couple day history of shortness of breath associated midsternal chest pressure patient woke up this night with nausea and vomiting and midsternal chest pressure no fevers chills no cough no sick contacts no recent travel patient is legs are slightly swollen.  Patient does not smoke or drink but does have a history of blood clots in the past   REVIEW OF SYSTEMS:   Constitutional: Negative for fever,chills or night sweats  ENT: Negative for rhinorrhea, epistaxis, hoarseness, sore throat.  Gastrointestinal: Negative for nausea, vomiting, diarrhea  Genitourinary: Negative for polyuria, dysuria   Hematologic/Lymphatic: Negative for bleeding tendency, easy bruising  Musculoskeletal: Negative for myalgias and arthralgias  Neurologic: Negative for confusion,dysarthria.  Skin: Negative for itching,rash  Psychiatric: Negative for depression,anxiety, agitation.  Endocrine: Negative for polydipsia,polyuria,heat /cold intolerance.    Past Medical History:   has a past medical history of AKI (acute kidney injury) (HCC), Anxiety, Asthma, Blood circulation, collateral, CAD (coronary artery disease), CHF (congestive heart failure) (HCC), Chronic back pain, Deep vein thrombosis, Degeneration of thoracic intervertebral disc, Depression, GERD (gastroesophageal reflux disease), GERD (gastroesophageal reflux disease), Hx of blood clots, Hyperlipidemia, Hypertension, Idiopathic peripheral  neuropathy, Melanoma (HCC), MRSA (methicillin resistant staph aureus) culture positive, Obesity, Obstructive sleep apnea, Obstructive sleep apnea (adult) (pediatric), Pulmonary embolism (HCC), Stenosis of right carotid artery, and Type 2 diabetes mellitus with diabetic polyneuropathy, without long-term current use of insulin (HCC).     Past Surgical History:   has a past surgical history that includes Colonoscopy; Cesarean section; Cholecystectomy; shoulder surgery; Lap Band (05/01/2008); back surgery; Colonoscopy (04/08/2010); other surgical history; Upper gastrointestinal endoscopy (05/19/2013); other surgical history (07/05/2013); Upper gastrointestinal endoscopy (07/21/2013); Upper gastrointestinal endoscopy (N/A, 07/31/2014); vascular surgery (04/2015); Diagnostic Cardiac Cath Lab Procedure; Coronary angioplasty; Endoscopy, colon, diagnostic; Breast surgery; Dilatation, esophagus; Skin cancer excision (12/29/2017); Hysterectomy, vaginal; Hysterectomy; Upper gastrointestinal endoscopy (N/A, 04/18/2021); Colonoscopy (N/A, 04/18/2021); and cervical fusion.     Medications:  No current facility-administered medications on file prior to encounter.     Current Outpatient Medications on File Prior to Encounter   Medication Sig Dispense Refill    torsemide (DEMADEX) 20 MG tablet Take 1 tablet by mouth daily 90 tablet 0    dilTIAZem (CARDIZEM CD) 180 MG extended release capsule TAKE ONE CAPSULE BY MOUTH DAILY 90 capsule 3    spironolactone (ALDACTONE) 25 MG tablet TAKE 1 TABLET BY MOUTH DAILY 30 tablet 3    fluocinonide (LIDEX) 0.05 % external solution APPLY SPARINGLY TO THE BACK OF THE SCALP DAILY AS NEEDED FOR ITCHING 60 mL 1    ketoconazole (NIZORAL) 2 % cream Apply to the rash on the upper back twice daily for 2 weeks. 30 g 2    gabapentin (NEURONTIN) 100 MG capsule Take 1 capsule by mouth nightly.      metFORMIN (GLUCOPHAGE) 500 MG tablet Take 1 tablet by mouth 2 times daily (with meals)      isosorbide mononitrate  (  IMDUR) 30 MG extended release tablet Take 1 tablet by mouth daily 90 tablet 3    nitroGLYCERIN (NITROSTAT) 0.4 MG SL tablet up to max of 3 total doses. If no relief after 1 dose, call 911. 25 tablet 3    ranolazine (RANEXA) 500 MG extended release tablet TAKE ONE TABLET BY MOUTH TWICE A DAY 180 tablet 3    clobetasol (TEMOVATE) 0.05 % cream Apply to affected area twice daily for up to 2 weeks or until improved. 60 g 1    omeprazole (PRILOSEC) 40 MG delayed release capsule TAKE ONE CAPSULE BY MOUTH DAILY 30 capsule 0    triamcinolone (KENALOG) 0.1 % cream Apply to affected area twice daily for up to 2 weeks or until improved. 15 g 2    topiramate (TOPAMAX) 25 MG tablet Take 2 tablets by mouth 2 times daily      montelukast (SINGULAIR) 10 MG tablet TAKE ONE TABLET BY MOUTH DAILY. **MUST CALL MD FOR APPOINTMENT 30 tablet 5    oxyCODONE-acetaminophen (PERCOCET) 7.5-325 MG per tablet as needed.      rosuvastatin (CRESTOR) 20 MG tablet daily      magnesium (MAGNESIUM-OXIDE) 250 MG TABS tablet Take 350 mg by mouth daily      Coenzyme Q10 (CO Q10) 200 MG CAPS Take by mouth daily (with breakfast)      calcium citrate-vitamin D (CITRICAL + D) 315-250 MG-UNIT TABS per tablet Take 2 tablets by mouth 2 times daily      diclofenac sodium (VOLTAREN) 1 % GEL APPLY 4 GRAMS TO AFFECTED AREA(S) TWO TIMES A DAY 100 g 1    Cholecalciferol (VITAMIN D3) 50 MCG (2000 UT) CAPS Take 1 capsule by mouth daily 90 capsule 3    albuterol sulfate HFA (PROVENTIL HFA) 108 (90 Base) MCG/ACT inhaler Inhale 2 puffs into the lungs every 6 hours as needed for Wheezing 3 Inhaler 3    lidocaine (LIDODERM) 5 % Place 1 patch onto the skin daily 12 hours on, 12 hours off. (Patient taking differently: Place 1 patch onto the skin as needed 12 hours on, 12 hours off.) 30 patch 0    aspirin 81 MG EC tablet Take 1 tablet by mouth daily 30 tablet 11    Multiple Vitamins-Minerals (MULTIVITAMIN PO) Take 1 tablet by mouth daily          Allergies:  Allergies    Allergen Reactions    Ibuprofen      Due to lap band erosion    Lisinopril      Memory loss     Seasonal     Talwin [Pentazocine] Other (See Comments)     dizzy        Social History:  [facility] at [facility]   reports that she quit smoking about 10 years ago. Her smoking use included cigarettes. She started smoking about 50 years ago. She has a 20.0 pack-year smoking history. She quit smokeless tobacco use about 9 years ago. She reports that she does not drink alcohol and does not use drugs.     Family History:  family history includes Arthritis in her mother; Cancer in her mother; Cancer (age of onset: 61) in her father; Depression in her mother; Diabetes in an other family member; Early Death in her paternal grandmother; Heart Disease in her maternal grandmother and paternal grandfather; Heart Disease (age of onset: 82) in her mother; High Blood Pressure in her mother and another family member; Obesity in an other family member; Other  in her brother, brother, daughter, and sister; Ovarian Cancer (age of onset: 82) in her mother. ,     Physical Exam:  BP (!) 157/88   Pulse 68   Temp 98.9 F (37.2 C) (Oral)   Resp 18   Wt 133.8 kg (295 lb)   SpO2 98%   BMI 49.09 kg/m     General appearance:  Appears comfortable. AAOx3  HEENT: atraumatic, Pupils equal, muscous membranes moist, no masses appreciated  Cardiovascular: Regular rate and rhythm no murmurs appreciated  Respiratory: CTAB no wheezing  Gastrointestinal: Abdomen soft, non-tender, BS+  EXT: trace edema  Neurology: no gross focal deficts  Psychiatry: Appropriate affect. Not agitated  Skin: Warm, dry, no rashes appreciated    Labs:  CBC:   Lab Results   Component Value Date/Time    WBC 7.2 03/31/2023 04:01 PM    RBC 4.74 03/31/2023 04:01 PM    HGB 13.5 03/31/2023 04:01 PM    HCT 41.5 03/31/2023 04:01 PM    MCV 87.6 03/31/2023 04:01 PM    MCH 28.5 03/31/2023 04:01 PM    MCHC 32.5 03/31/2023 04:01 PM    RDW 15.2 03/31/2023 04:01 PM    PLT 187 03/31/2023  04:01 PM    MPV 9.1 03/31/2023 04:01 PM     BMP:    Lab Results   Component Value Date/Time    NA 138 03/31/2023 04:01 PM    K 4.2 03/31/2023 04:01 PM    K 4.6 10/17/2019 06:07 AM    CL 101 03/31/2023 04:01 PM    CO2 25 03/31/2023 04:01 PM    BUN 21 03/31/2023 04:01 PM    CREATININE 1.4 03/31/2023 04:01 PM    CALCIUM 10.0 03/31/2023 04:01 PM    GFRAA >60 10/18/2019 05:42 AM    GFRAA >60 03/16/2012 03:42 PM    LABGLOM 42 03/31/2023 04:01 PM    GLUCOSE 114 03/31/2023 04:01 PM     XR CHEST PORTABLE   Final Result   No acute process.             Recent imaging reviewed    Problem List  Principal Problem:    Chronic diastolic heart failure (HCC)  Active Problems:    Chronic diastolic congestive heart failure (HCC)    Dyslipidemia    AKI (acute kidney injury) (HCC)    Morbid obesity with body mass index (BMI) of 45.0 to 49.9 in adult [facility])    Essential hypertension  Resolved Problems:    * No resolved hospital problems. 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    Please note that some part of this chart was generated using Scientist, clinical (histocompatibility and immunogenetics). Although every effort was made to ensure the accuracy of this automated transcription, some errors in transcription may have occurred inadvertently. If you may need any clarification, please do not hesitate to contact me through [facility].       Mikal Plane, MD    03/31/2023 5:00  PM

## 2023-03-31 NOTE — Plan of Care (Signed)
Problem: Safety - Adult  Goal: Free from fall injury  04/01/2023 0007 by Dianna Rossetti, RN  Outcome: Progressing  04/01/2023 0006 by Dianna Rossetti, RN  Outcome: Progressing     Problem: Chronic Conditions and Co-morbidities  Goal: Patient's chronic conditions and co-morbidity symptoms are monitored and maintained or improved  Outcome: Progressing     Problem: Respiratory - Adult  Goal: Achieves optimal ventilation and oxygenation  Outcome: Progressing     Problem: Skin/Tissue Integrity - Adult  Goal: Skin integrity remains intact  Outcome: Progressing

## 2023-03-31 NOTE — Progress Notes (Signed)
Pt admitted for dyspnea, poss chf exac    Full h+p to follow    Active Hospital Problems    Diagnosis Date Noted    Dyslipidemia [E78.5] 12/25/2016     Priority: High    Chronic diastolic congestive heart failure (HCC) [I50.32] 03/30/2016     Priority: High    AKI (acute kidney injury) (HCC) [N17.9] 01/26/2022     Priority: Medium    Essential hypertension [I10] 07/24/2013    Morbid obesity with body mass index (BMI) of 45.0 to 49.9 in adult [facility]) [E66.01, Z68.42] 02/21/2009       Please use [employer] to contact me with any questions during the day.   The hospitalist service will provide cross-coverage for this patient from 7pm to 7am.    During those hours, contact the on-call hospitalist MD/APP for questions.

## 2023-03-31 NOTE — ED Provider Notes (Signed)
I did not perform history or physical on Rachael Mays.  This patient was seen independently by an APP. I did review the EKG, which is documented below.     EKG  The Ekg interpreted by me in the absence of a cardiologist shows.  normal sinus rhythm with a rate of 66  Axis is   Left axis deviation  QTc is  normal  Intervals and Durations are unremarkable.      No specific ST-T wave changes appreciated.  No evidence of acute ischemia.   No significant change from prior EKG dated 06/03/2022          Herminio Commons, MD  03/31/23 [phone]

## 2023-03-31 NOTE — Progress Notes (Addendum)
Patient seen in ED, room 12.  Admission completed with the following exceptions:  4 Eyes Assessment, Immunizations, Vaccines, Rights and Responsibilities, Orientation to room, Plan of Care, Education/Learning Assessment and Education Plan, white board, height and weight, pain assessment and head to toe assessment.  Patient is alert and oriented X 4.  [place] and is being admitted for Chronic Diastolic Heart Failure.  Home Medications as well as Outside Sources have been verbally reviewed with patient and updated if appropriate and will be completed per pharmacy technician Jomarie Longs. Ex spouse is at bedside.     Patient does use a bipap at night.

## 2023-03-31 NOTE — ED Notes (Signed)
Tanish RN called, states no questions, okay for patient to be sent to unit.

## 2023-04-01 LAB — POCT GLUCOSE
POC Glucose: 106 mg/dl — ABNORMAL HIGH (ref 70–99)
POC Glucose: 113 mg/dl — ABNORMAL HIGH (ref 70–99)
POC Glucose: 135 mg/dl — ABNORMAL HIGH (ref 70–99)
POC Glucose: 140 mg/dl — ABNORMAL HIGH (ref 70–99)

## 2023-04-01 LAB — CBC WITH AUTO DIFFERENTIAL
Basophils %: 0.5 %
Basophils Absolute: 0 10*3/uL (ref 0.0–0.2)
Eosinophils %: 4.4 %
Eosinophils Absolute: 0.3 10*3/uL (ref 0.0–0.6)
Hematocrit: 39.1 % (ref 36.0–48.0)
Hemoglobin: 12.8 g/dL (ref 12.0–16.0)
Lymphocytes %: 42.1 %
Lymphocytes Absolute: 2.8 10*3/uL (ref 1.0–5.1)
MCH: 28.6 pg (ref 26.0–34.0)
MCHC: 32.6 g/dL (ref 31.0–36.0)
MCV: 87.5 fL (ref 80.0–100.0)
MPV: 9.3 fL (ref 5.0–10.5)
Monocytes %: 12.4 %
Monocytes Absolute: 0.8 10*3/uL (ref 0.0–1.3)
Neutrophils %: 40.6 %
Neutrophils Absolute: 2.7 10*3/uL (ref 1.7–7.7)
Platelets: 170 10*3/uL (ref 135–450)
RBC: 4.47 M/uL (ref 4.00–5.20)
RDW: 14.9 % (ref 12.4–15.4)
WBC: 6.7 10*3/uL (ref 4.0–11.0)

## 2023-04-01 LAB — COMPREHENSIVE METABOLIC PANEL
ALT: 14 U/L (ref 10–40)
AST: 19 U/L (ref 15–37)
Albumin/Globulin Ratio: 1.3 (ref 1.1–2.2)
Albumin: 3.9 g/dL (ref 3.4–5.0)
Alkaline Phosphatase: 94 U/L (ref 40–129)
Anion Gap: 12 (ref 3–16)
BUN: 23 mg/dL — ABNORMAL HIGH (ref 7–20)
CO2: 26 mmol/L (ref 21–32)
Calcium: 9.8 mg/dL (ref 8.3–10.6)
Chloride: 105 mmol/L (ref 99–110)
Creatinine: 1.3 mg/dL — ABNORMAL HIGH (ref 0.6–1.2)
Est, Glom Filt Rate: 46 — AB (ref >60)
Glucose: 118 mg/dL — ABNORMAL HIGH (ref 70–99)
Potassium: 4.2 mmol/L (ref 3.5–5.1)
Sodium: 143 mmol/L (ref 136–145)
Total Bilirubin: 0.5 mg/dL (ref 0.0–1.0)
Total Protein: 6.8 g/dL (ref 6.4–8.2)

## 2023-04-01 LAB — EKG 12-LEAD
Atrial Rate: 66 {beats}/min
P Axis: 109 degrees
P-R Interval: 160 ms
Q-T Interval: 430 ms
QRS Duration: 76 ms
QTc Calculation (Bazett): 450 ms
R Axis: -3 degrees
T Axis: 42 degrees
Ventricular Rate: 66 {beats}/min

## 2023-04-01 LAB — ECHOCARDIOGRAM COMPLETE 2D W DOPPLER W COLOR: Left Ventricular Ejection Fraction: 58

## 2023-04-01 LAB — T4, FREE: T4 Free: 1.2 ng/dL (ref 0.9–1.8)

## 2023-04-01 LAB — LIPID PANEL
Cholesterol, Total: 137 mg/dL (ref 0–199)
HDL: 54 mg/dL (ref 40–60)
LDL Calculated: 47 mg/dL (ref <100)
Triglycerides: 180 mg/dL — ABNORMAL HIGH (ref 0–150)
VLDL Cholesterol Calculated: 36 mg/dL

## 2023-04-01 LAB — TSH: TSH: 1.37 u[IU]/mL (ref 0.27–4.20)

## 2023-04-01 LAB — HEMOGLOBIN A1C
Estimated Avg Glucose: 111.2 mg/dL
Hemoglobin A1C: 5.5 %

## 2023-04-01 LAB — D-DIMER, QUANTITATIVE: D-Dimer, Quant: <0.27 ug/mL FEU (ref 0.00–0.60)

## 2023-04-01 LAB — MAGNESIUM: Magnesium: 2.1 mg/dL (ref 1.80–2.40)

## 2023-04-01 LAB — PROCALCITONIN: Procalcitonin: 0.09 ng/mL (ref 0.00–0.15)

## 2023-04-01 MED ORDER — LIDOCAINE 4 % EX PTCH
4 % | Freq: Every day | CUTANEOUS | Status: AC
Start: 2023-04-01 — End: 2023-04-06
  Administered 2023-04-01 – 2023-04-06 (×8): 1 via TOPICAL

## 2023-04-01 MED ORDER — OXYCODONE-ACETAMINOPHEN 7.5-325 MG PO TABS
7.5-325 MG | Freq: Four times a day (QID) | ORAL | Status: DC | PRN
Start: 2023-04-01 — End: 2023-04-06
  Administered 2023-04-01 – 2023-04-06 (×16): 1 via ORAL

## 2023-04-01 MED ORDER — SODIUM CHLORIDE 0.9 % IV SOLN
0.9 % | INTRAVENOUS | Status: DC
Start: 2023-04-01 — End: 2023-04-03
  Administered 2023-04-01 – 2023-04-03 (×2): via INTRAVENOUS

## 2023-04-01 MED ORDER — FUROSEMIDE 10 MG/ML IJ SOLN
10 | Freq: Two times a day (BID) | INTRAMUSCULAR | Status: AC
Start: 2023-04-01 — End: 2023-04-01
  Administered 2023-04-01: 22:00:00 40 mg via INTRAVENOUS

## 2023-04-01 MED FILL — PANTOPRAZOLE SODIUM 40 MG PO TBEC: 40 MG | ORAL | Qty: 1

## 2023-04-01 MED FILL — MONTELUKAST SODIUM 10 MG PO TABS: 10 MG | ORAL | Qty: 1

## 2023-04-01 MED FILL — MAGNESIUM OXIDE -MG SUPPLEMENT 400 (240 MG) MG PO TABS: 400 (240 Mg) MG | ORAL | Qty: 1

## 2023-04-01 MED FILL — LOVENOX 30 MG/0.3ML IJ SOSY: 30 MG/0.3ML | INTRAMUSCULAR | Qty: 0.3

## 2023-04-01 MED FILL — OS-CAL CALCIUM + D3 500-5 MG-MCG PO TABS: 500-5 MG-MCG | ORAL | Qty: 2

## 2023-04-01 MED FILL — OXYCODONE-ACETAMINOPHEN 7.5-325 MG PO TABS: ORAL | Qty: 1

## 2023-04-01 MED FILL — FUROSEMIDE 10 MG/ML IJ SOLN: 10 MG/ML | INTRAMUSCULAR | Qty: 4

## 2023-04-01 MED FILL — ASPIRIN LOW DOSE 81 MG PO TBEC: 81 MG | ORAL | Qty: 1

## 2023-04-01 MED FILL — ISOSORBIDE MONONITRATE ER 30 MG PO TB24: 30 MG | ORAL | Qty: 1

## 2023-04-01 MED FILL — SPIRONOLACTONE 25 MG PO TABS: 25 MG | ORAL | Qty: 1

## 2023-04-01 MED FILL — GABAPENTIN 300 MG PO CAPS: 300 MG | ORAL | Qty: 1

## 2023-04-01 MED FILL — ROSUVASTATIN CALCIUM 40 MG PO TABS: 40 MG | ORAL | Qty: 1

## 2023-04-01 MED FILL — DILTIAZEM HCL ER COATED BEADS 180 MG PO CP24: 180 MG | ORAL | Qty: 1

## 2023-04-01 MED FILL — RANOLAZINE ER 500 MG PO TB12: 500 MG | ORAL | Qty: 1

## 2023-04-01 MED FILL — ONDANSETRON HCL 4 MG/2ML IJ SOLN: 4 MG/2ML | INTRAMUSCULAR | Qty: 2

## 2023-04-01 MED FILL — VITAMIN D3 25 MCG (1000 UT) PO TABS: 25 MCG (1000 UT) | ORAL | Qty: 2

## 2023-04-01 MED FILL — ASPERCREME LIDOCAINE 4 % EX PTCH: 4 % | CUTANEOUS | Qty: 1

## 2023-04-01 MED FILL — TORSEMIDE 20 MG PO TABS: 20 MG | ORAL | Qty: 1

## 2023-04-01 NOTE — Progress Notes (Signed)
[facility] - Inpatient Rehabilitation Department   Phone: [phone]    Occupational Therapy    [x]  Initial Evaluation            []  Daily Treatment Note         [x]  Discharge Summary      Patient: Rachael Mays   DOB: 1956/05/08   MRN: [id number]   Date of Service:  04/01/2023    Admitting Diagnosis:  Chronic diastolic heart failure (HCC)  Current Admission Summary: 67 y.o. female who presents to the hospital with couple day history of shortness of breath associated midsternal chest pressure patient woke up this night with nausea and vomiting and midsternal chest pressure no fevers chills no cough no sick contacts no recent travel patient is legs are slightly swollen. Patient does not smoke or drink but does have a history of blood clots in the past   Past Medical History:  has a past medical history of AKI (acute kidney injury) (HCC), Anxiety, Asthma, Blood circulation, collateral, CAD (coronary artery disease), CHF (congestive heart failure) (HCC), Chronic back pain, Deep vein thrombosis, Degeneration of thoracic intervertebral disc, Depression, GERD (gastroesophageal reflux disease), GERD (gastroesophageal reflux disease), Hx of blood clots, Hyperlipidemia, Hypertension, Idiopathic peripheral neuropathy, Melanoma (HCC), MRSA (methicillin resistant staph aureus) culture positive, Obesity, Obstructive sleep apnea, Obstructive sleep apnea (adult) (pediatric), Pulmonary embolism (HCC), Stenosis of right carotid artery, and Type 2 diabetes mellitus with diabetic polyneuropathy, without long-term current use of insulin (HCC).  Past Surgical History:  has a past surgical history that includes Colonoscopy; Cesarean section; Cholecystectomy; shoulder surgery; Lap Band (05/01/2008); back surgery; Colonoscopy (04/08/2010); other surgical history; Upper gastrointestinal endoscopy (05/19/2013); other surgical history (07/05/2013); Upper gastrointestinal endoscopy (07/21/2013); Upper gastrointestinal endoscopy  (N/A, 07/31/2014); vascular surgery (04/2015); Diagnostic Cardiac Cath Lab Procedure; Coronary angioplasty; Endoscopy, colon, diagnostic; Breast surgery; Dilatation, esophagus; Skin cancer excision (12/29/2017); Hysterectomy, vaginal; Hysterectomy; Upper gastrointestinal endoscopy (N/A, 04/18/2021); Colonoscopy (N/A, 04/18/2021); and cervical fusion.    Discharge Recommendations: SHERRITA RIEDERER scored a 24/24 on the AM-PAC ADL Inpatient form.  At this time, no further OT is recommended upon discharge due to patient at independent level.  Recommend patient returns to prior setting with prior services.      DME Required For Discharge: No DME required    Precautions/Restrictions: medium fall risk  Positional Restrictions:no positional restrictions    Pre-Admission Information   Lives With:  roommate                        Type of Home: house  Home Layout: tri-level, stays on the main floor with bathroom and bedroom  Home Access:  3 step to enter with handrail.  Handrails are located on (R) side.  Bathroom Layout: Cabin crew: hand held Doctor, hospital Height: elevated height  Home Equipment: rollator - 4 wheeled walker, single point cane, crutches  Transfer Assistance: Independent without use of device  Ambulation Assistance:Independent without use of device  ADL Assistance: independent with all ADL's  IADL Assistance: independent with homemaking tasks  Active Driver:       [x]   Yes                 []  No  Hand Dominance: []  Left                 [x]  Right  Current Employment: retired.  Occupation: Statistician   Hobbies: play with grand kids, spend time with  daughters   Recent Falls: denies hx of recent falls      Recently got out of a walking boot on the (L) foot due to bone spurs     Examination   Vision:   Vision Gross Assessment: Impaired and Vision Corrective Device: wears glasses for reading  Hearing:   [facility]  Observation:   Patient on RA during session - 96% at rest  Sensation:   reports  numbness and tingling in all extremities - neuropathy  ROM:   (B) UE ROM WFL  Strength:   (B) UE gross strength WFL    Therapist Clinical Decision Making (Complexity): low complexity  Clinical Presentation: stable      Subjective  General: Pt supine in bed upon arrival, pleasant and agreeable to evaluation. Reports being up ad lib  Pain: 4/10.  Location: LLE  Pain Interventions: not applicable        Activities of Daily Living  Basic Activities of Daily Living  Lower Extremity Dressing: Independent  Dressing Comments: donning shoes  Instrumental Activities of Daily Living  No IADL completed on this date.    Functional Mobility  Bed Mobility  Supine to Sit: modified independent  Scooting: Independent  Comments:  Transfers  Sit to stand transfer:Independent  Stand to sit transfer: Independent  Comments:  Functional Mobility  Functional Mobility Activity: around room, hallway - pt up ad lib  Device Use: no device  Required Assistance: Independent  Balance:  Static Sitting Balance: good(+): independent with high level dynamic balance in unsupported position  Dynamic Sitting Balance: good(+): independent with high level dynamic balance in unsupported position  Static Standing Balance: good: independent with functional balance in unsupported position  Dynamic Standing Balance: good: independent with functional balance in unsupported position  Comments:    Other Therapeutic Interventions    Functional Outcomes  AM-PAC Inpatient Daily Activity Raw Score: 24    Cognition  WFL  Orientation:    A&O x 4  Command Following:   [facility]     Education  Barriers To Learning: none  Patient Education: Patient educated on OT role and benefits, discharge recommendations  Learning Assessment:  Patient verbalized and demonstrates understanding    Assessment  Activity Tolerance: 93% s/p mob on RA, 96% at rest  Impairments Requiring Therapeutic Intervention: none - eval with same day discharge  Prognosis: good without need for therapy  intervention  Clinical Assessment: Patient presenting at independent level for completion of required self care tasks for return to home.  Eval with d/c at this time.  No therapy services indicated.   Safety Interventions: call light within reach, nurse notified, and seated EOB    Plan  Frequency: Eval with same day discharge.  No follow up required.  Current Treatment Recommendations: Not applicable, evaluation completed with same day discharge.    Goals  Patient eval with same day discharge.  No goals set as patient is at baseline self care status.      Therapy Session Time     Individual Group Co-treatment   Time In    1056   Time Out    1143   Minutes    47        Timed Code Treatment Minutes:   32  Total Treatment Minutes:  47       Electronically Signed By: Carren Rang, OT    Carren Rang, MOT OTR/L [phone]

## 2023-04-01 NOTE — Progress Notes (Signed)
[place] [facility] PROGRESS NOTE    04/01/2023 12:11 PM        Name: Rachael Mays .              Admitted: 03/31/2023  Primary Care Provider: Maximino Sarin, DO (Tel: [phone])        Subjective:    Patient lying in bed still feeling short of breath saturating 96% on room air no nausea vomiting chest pain    Reviewed interval ancillary notes    Current Medications  furosemide (LASIX) injection 40 mg, BID  aspirin EC tablet 81 mg, Daily  albuterol sulfate HFA (PROVENTIL;VENTOLIN;PROAIR) 108 (90 Base) MCG/ACT inhaler 2 puff, Q6H PRN  Vitamin D (CHOLECALCIFEROL) tablet 2,000 Units, Daily  oyster shell calcium w/D 500-5 MG-MCG tablet 2 tablet, BID  oxyCODONE-acetaminophen (PERCOCET) 7.5-325 MG per tablet 1 tablet, Q8H PRN  rosuvastatin (CRESTOR) tablet 40 mg, Daily  magnesium oxide (MAG-OX) tablet 400 mg, Daily  montelukast (SINGULAIR) tablet 10 mg, Nightly  ranolazine (RANEXA) extended release tablet 500 mg, BID  isosorbide mononitrate (IMDUR) extended release tablet 30 mg, Daily  nitroGLYCERIN (NITROSTAT) SL tablet 0.4 mg, Q5 Min PRN  gabapentin (NEURONTIN) capsule 300 mg, Nightly  spironolactone (ALDACTONE) tablet 25 mg, Daily  dilTIAZem (CARDIZEM CD) extended release capsule 180 mg, Daily  torsemide (DEMADEX) tablet 20 mg, Daily  pantoprazole (PROTONIX) tablet 40 mg, QAM AC  sodium chloride flush 0.9 % injection 5-40 mL, 2 times per day  sodium chloride flush 0.9 % injection 10 mL, PRN  0.9 % sodium chloride infusion, PRN  ondansetron (ZOFRAN-ODT) disintegrating tablet 4 mg, Q8H PRN   Or  ondansetron (ZOFRAN) injection 4 mg, Q6H PRN  magnesium hydroxide (MILK OF MAGNESIA) 400 MG/5ML suspension 30 mL, Daily PRN  acetaminophen (TYLENOL) tablet 650 mg, Q6H PRN   Or  acetaminophen (TYLENOL) suppository 650 mg, Q6H PRN  enoxaparin Sodium (LOVENOX) injection 30 mg, BID  potassium chloride (KLOR-CON M) extended release tablet 40 mEq, PRN    Or  potassium bicarb-citric acid (EFFER-K) effervescent tablet 40 mEq, PRN   Or  potassium chloride 10 mEq/100 mL IVPB (Peripheral Line), PRN  magnesium sulfate 2000 mg in 50 mL IVPB premix, PRN  hydrALAZINE (APRESOLINE) injection 10 mg, Q6H PRN  sodium chloride 0.9 % bolus 500 mL, PRN  insulin lispro (HUMALOG) injection vial 0-8 Units, TID WC  insulin lispro (HUMALOG) injection vial 0-4 Units, Nightly  dextrose bolus 10% 125 mL, PRN   Or  dextrose bolus 10% 250 mL, PRN  dextrose 10 % infusion, Continuous PRN  lidocaine 4 % external patch 1 patch, Daily        Objective:  BP 137/75   Pulse 69   Temp 97.9 F (36.6 C) (Oral)   Resp 16   Wt (!) 137.1 kg (302 lb 3.2 oz)   SpO2 93%   BMI 50.29 kg/m   No intake or output data in the 24 hours ending 04/01/23 1211   Wt Readings from Last 3 Encounters:   04/01/23 (!) 137.1 kg (302 lb 3.2 oz)   02/08/23 136.1 kg (300 lb)   09/29/22 (!) 138.3 kg (305 lb)       General appearance:  Appears comfortable. AAOx3  HEENT: atraumatic, Pupils equal, muscous membranes moist, no masses appreciated  Cardiovascular: Regular rate and rhythm no murmurs appreciated  Respiratory: CTAB no wheezing  Gastrointestinal: Abdomen soft, non-tender, BS+  EXT: trace edema  Neurology: no gross focal deficts  Psychiatry: Appropriate affect. Not agitated  Skin: Warm,  dry, no rashes appreciated       Labs and Tests:  CBC:   Recent Labs     03/31/23  1601 04/01/23  0529   WBC 7.2 6.7   HGB 13.5 12.8   PLT 187 170     BMP:    Recent Labs     03/31/23  1601 04/01/23  0529   NA 138 143   K 4.2 4.2   CL 101 105   CO2 25 26   BUN 21* 23*   CREATININE 1.4* 1.3*   GLUCOSE 114* 118*     Hepatic:   Recent Labs     03/31/23  1601 04/01/23  0529   AST 21 19   ALT 16 14   BILITOT 0.5 0.5   ALKPHOS 100 94     XR CHEST (2 VW)   Final Result   No acute process.         CT CHEST PULMONARY EMBOLISM W CONTRAST   Final Result   No evidence of pulmonary embolism or acute pulmonary abnormality.         XR CHEST PORTABLE    Final Result   No acute process.             Recent imaging reviewed    Problem List  Principal Problem:    Chronic diastolic heart failure (HCC)  Active Problems:    Chronic diastolic congestive heart failure (HCC)    Dyslipidemia    AKI (acute kidney injury) (HCC)    Morbid obesity with body mass index (BMI) of 45.0 to 49.9 in adult [facility])    Essential hypertension  Resolved Problems:    * No resolved hospital problems. *       Assessment/Plan:   Chest pain  Troponin negative CT PE was negative patient walked on room air sats greater than 90% on room air will await cardiology input continue Lasix for 1 more day IV check BMP in a.m. for creatinine mag and K     Ckd3 monitor        Dm2 ssi     DVT prophylaxis lovenox  Code status full code      Mikal Plane, MD   04/01/2023 12:11 PM

## 2023-04-01 NOTE — Progress Notes (Signed)
Shift assessment completed. Routine vitals stable. Scheduled medications given. Patient is awake, alert and oriented. Respirations are easy and unlabored. Patient does not appear to be in distress, resting comfortably at this time. Call light within reach. No further needs expressed.

## 2023-04-01 NOTE — Consults (Signed)
[facility]   CONSULTATION  [id number]      Chief Complaint   Patient presents with    Shortness of Breath     Pt reports trouble breathing onset Saturday. Pt has hx of CHF. Denies chest pain in triage.          History of Present Illness:  [name] has a history of coronary artery disease s/p DES of LAD (03/2016), hypertension, hyperlipidemia, diastolic heart failure, and carotid artery stenosis.  She has chronic chest pain and shortness of breath that did not resolve after coronary intervention in 03/2016.  RHC (2017) . Additional history includes DVT/PE,  s/p lap band surgery in 2014. S/p dilation of esophagus 04/2021     02/18/21 LHC showed patent proximal LAD stent with mid 30% stenosis in some views and FFR 0.89.  Medical management was recommended.  LHC 11/22 mild nonobstructive cad with mild myocardial bridging. 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       Patient presents to the hospital with couple day history of shortness of breath associated midsternal chest pressure patient woke up this night with nausea and vomiting and midsternal chest pressure no fevers chills no cough no sick contacts no recent travel patient is legs are slightly swollen.  Patient does not smoke or drink but does have a history of blood clots in the past     Cardiology has been consulted for further evaluation.     She reports dizziness on Thursday, felt like vertigo, room spinning. Then Sunday her shortness of breath worsened and has not improving. She feels that it is similar to when she had stents. She is unable to get a deep breath. She endorses nausea off and on since Thursday. She is tearful during visit. Chest tenderness on palpation. She does feel some palpitations.       Past Medical History:   has a past medical history of AKI (acute kidney injury) (HCC), Anxiety, Asthma, Blood circulation, collateral, CAD (coronary artery disease), CHF (congestive heart failure) (HCC), Chronic back pain, Deep  vein thrombosis, Degeneration of thoracic intervertebral disc, Depression, GERD (gastroesophageal reflux disease), GERD (gastroesophageal reflux disease), Hx of blood clots, Hyperlipidemia, Hypertension, Idiopathic peripheral neuropathy, Melanoma (HCC), MRSA (methicillin resistant staph aureus) culture positive, Obesity, Obstructive sleep apnea, Obstructive sleep apnea (adult) (pediatric), Pulmonary embolism (HCC), Stenosis of right carotid artery, and Type 2 diabetes mellitus with diabetic polyneuropathy, without long-term current use of insulin (HCC).    Surgical History:   has a past surgical history that includes Colonoscopy; Cesarean section; Cholecystectomy; shoulder surgery; Lap Band (05/01/2008); back surgery; Colonoscopy (04/08/2010); other surgical history; Upper gastrointestinal endoscopy (05/19/2013); other surgical history (07/05/2013); Upper gastrointestinal endoscopy (07/21/2013); Upper gastrointestinal endoscopy (N/A, 07/31/2014); vascular surgery (04/2015); Diagnostic Cardiac Cath Lab Procedure; Coronary angioplasty; Endoscopy, colon, diagnostic; Breast surgery; Dilatation, esophagus; Skin cancer excision (12/29/2017); Hysterectomy, vaginal; Hysterectomy; Upper gastrointestinal endoscopy (N/A, 04/18/2021); Colonoscopy (N/A, 04/18/2021); and cervical fusion.     Social History:   reports that she quit smoking about 10 years ago. Her smoking use included cigarettes. She started smoking about 50 years ago. She has a 20.0 pack-year smoking history. She has never used smokeless tobacco. She reports that she does not drink alcohol and does not use drugs.     Family History:  family history includes Arthritis in her mother; Cancer in her mother and sister; Cancer (age of onset: 39) in her father; Depression in her mother; Diabetes in an other family member; Early Death in  her paternal grandmother; Heart Disease in her maternal grandmother and paternal grandfather; Heart Disease (age of onset: 33) in her  mother; High Blood Pressure in her mother and another family member; Obesity in an other family member; Other in her brother, brother, daughter, and sister; Ovarian Cancer (age of onset: 8) in her mother; Sleep Apnea in her brother and brother.    Home Medications:  Were reviewed and are listed in nursing record. and/or listed below  Prior to Admission medications    Medication Sig Start Date End Date Taking? Authorizing Provider   torsemide (DEMADEX) 20 MG tablet Take 1 tablet by mouth daily 02/08/23 05/09/23  Orlev-Shitrit, Mechele Dawley, MD   dilTIAZem (CARDIZEM CD) 180 MG extended release capsule TAKE ONE CAPSULE BY MOUTH DAILY 02/01/23   Court Joy, MD   spironolactone (ALDACTONE) 25 MG tablet TAKE 1 TABLET BY MOUTH DAILY 01/29/23   Orlev-Shitrit, Mechele Dawley, MD   fluocinonide (LIDEX) 0.05 % external solution APPLY SPARINGLY TO THE BACK OF THE SCALP DAILY AS NEEDED FOR ITCHING 12/01/22   Ronelle Nigh, MD   ketoconazole (NIZORAL) 2 % cream Apply to the rash on the upper back twice daily for 2 weeks.  Patient not taking: Reported on 03/31/2023 10/14/22   Ronelle Nigh, MD   gabapentin (NEURONTIN) 100 MG capsule Take 1 capsule by mouth nightly. 07/14/22   [provider]   metFORMIN (GLUCOPHAGE) 500 MG tablet Take 1 tablet by mouth 2 times daily (with meals)  Patient not taking: Reported on 03/31/2023    [provider]   isosorbide mononitrate (IMDUR) 30 MG extended release tablet Take 1 tablet by mouth daily 06/03/22   Court Joy, MD   nitroGLYCERIN (NITROSTAT) 0.4 MG SL tablet up to max of 3 total doses. If no relief after 1 dose, call 911. 06/03/22   Court Joy, MD   ranolazine (RANEXA) 500 MG extended release tablet TAKE ONE TABLET BY MOUTH TWICE A DAY 05/21/22   Court Joy, MD   clobetasol (TEMOVATE) 0.05 % cream Apply to affected area twice daily for up to 2 weeks or until improved. 04/13/22   Ronelle Nigh, MD   omeprazole (PRILOSEC) 40 MG delayed release capsule TAKE ONE CAPSULE  BY MOUTH DAILY 01/26/22   Wojciechowski, Hale Bogus, APRN - NP   triamcinolone (KENALOG) 0.1 % cream Apply to affected area twice daily for up to 2 weeks or until improved. 10/13/21   Ronelle Nigh, MD   topiramate (TOPAMAX) 25 MG tablet Take 2 tablets by mouth 2 times daily  Patient not taking: Reported on 03/31/2023    [provider]   montelukast (SINGULAIR) 10 MG tablet TAKE ONE TABLET BY MOUTH DAILY. **MUST CALL MD FOR APPOINTMENT 09/02/21   Wojciechowski, Hale Bogus, APRN - NP   oxyCODONE-acetaminophen (PERCOCET) 7.5-325 MG per tablet as needed. 06/18/21   [provider]   rosuvastatin (CRESTOR) 20 MG tablet daily 04/21/21   [provider]   magnesium (MAGNESIUM-OXIDE) 250 MG TABS tablet Take 350 mg by mouth daily    [provider]   Coenzyme Q10 (CO Q10) 200 MG CAPS Take by mouth daily (with breakfast)    [provider]   calcium citrate-vitamin D (CITRICAL + D) 315-250 MG-UNIT TABS per tablet Take 2 tablets by mouth 2 times daily    [provider]   diclofenac sodium (VOLTAREN) 1 % GEL APPLY 4 GRAMS TO AFFECTED AREA(S) TWO TIMES A DAY  Patient not  taking: Reported on 03/31/2023 12/23/20   Fanny Dance, MD   Cholecalciferol (VITAMIN D3) 50 MCG (2000 UT) CAPS Take 1 capsule by mouth daily 08/13/20   Wojciechowski, Hale Bogus, APRN - NP   albuterol sulfate HFA (PROVENTIL HFA) 108 (90 Base) MCG/ACT inhaler Inhale 2 puffs into the lungs every 6 hours as needed for Wheezing 07/13/19   Ulitzsch, Kearney Hard, APRN - CNP   lidocaine (LIDODERM) 5 % Place 1 patch onto the skin daily 12 hours on, 12 hours off.  Patient taking differently: Place 1 patch onto the skin as needed 12 hours on, 12 hours off. 04/05/19   Karrie Doffing, Gypsy Lore, PA-C   aspirin 81 MG EC tablet Take 1 tablet by mouth daily 02/16/17   Nadyne Coombes, Dutch Gray, MD   Multiple Vitamins-Minerals (MULTIVITAMIN PO) Take 1 tablet by mouth daily     [provider]        Allergies:  Ibuprofen,  Lisinopril, Seasonal, and Talwin [pentazocine]     Review of Systems:   A complete review of systems has been reviewed and updated today and is negative except as noted in the history of present illness.      Physical Examination:    Vitals:    04/01/23 1144   BP: 137/75   Pulse: 69   Resp: 16   Temp: 97.9 F (36.6 C)   SpO2: 93%    Weight - Scale: (!) 137.1 kg (302 lb 3.2 oz)         General Appearance:  Alert, cooperative, no distress, appears stated age   Head:  Normocephalic, without obvious abnormality, atraumatic   Eyes:  EOMI, conjunctiva/corneas clear       Nose: Nares normal   Throat: Lips normal   Neck: Supple, symmetrical, trachea midline,  no carotid bruit or JVD       Lungs:   Clear to auscultation bilaterally, respirations unlabored   Chest Wall:  No tenderness or deformity   Heart:  Regular rate and rhythm, S1, S2 normal, no murmur, rub or gallop   Abdomen:   Soft, non-tender, bowel sounds active all four quadrants,  no masses, no organomegaly           Extremities: Extremities normal, atraumatic, no cyanosis or edema   Pulses: 2+ and symmetric   Skin: Skin color, texture, turgor normal, no rashes or lesions   Pysch: Normal mood and affect   Neurologic: Normal gross motor and sensory exam.         EKG:  I have reviewed EKG with the following interpretation:  Impression:    31-Mar-2023 15:16:48 [facility]-FED ROUTINE RECORD  Normal sinus rhythm  Poor R wave progression  No significant change was found from ECG of 10/22    TELEMETRY (Personally reviewed by me): Sinus     Lab Data:  CBC:   Recent Labs     03/31/23  1601 04/01/23  0529   WBC 7.2 6.7   HGB 13.5 12.8   HCT 41.5 39.1   MCV 87.6 87.5   PLT 187 170     BMP:   Recent Labs     03/31/23  1601 04/01/23  0529   NA 138 143   K 4.2 4.2   CL 101 105   CO2 25 26   BUN 21* 23*   CREATININE 1.4* 1.3*     LIVER PROFILE:   Recent Labs     03/31/23  1601 04/01/23  0529  AST 21 19   ALT 16 14   BILITOT 0.5 0.5   ALKPHOS 100 94     PT/INR:   Recent Labs      03/31/23  1601   PROTIME 14.1   INR 1.06     APTT:   Recent Labs     03/31/23  1601   APTT 27.8     BNP:  No results for input(s): "BNP" in the last 72 hours.  Imaging/Procedures:   Echo 04/01/23   Summary   Technically difficult study due to body habitus. Definity was administered.   Left ventricular cavity size is normal with normal left ventricular wall   thickness. Ejection fraction is visually estimated to be 55-60%. Grade I   diastolic dysfunction with normal LV filling pressures. E/e' = 11.1.   Non-coronary cusp appears restricted.   Mild aortic stenosis with a peak velocity of 2.43m/s and a mean pressure   gradient of . AVA (VTI): 2.18 cm.2.   RVSP is estimated to be 21-24 mmHG.   IVC not well visualized.   Right atrium is not well visualized, but appears normal in size.   Right ventricle is not well visualized, but appears dilated.   There appears to be some calcification of the posterior mitral valve leaflet   without evidence of stenosis.    Cardiac cath 10/14/21 [facility])  Anatomy:   LM-normal  LAD-patent proximal stent with 10% in-stent restenosis, 30% mid with mild myocardial bridging  Cx-10% to 20% proximal  OM1-normal  RCA-normal  LVEF-70%     Impression:  1.  Mild nonobstructive CAD.  2.  Hyperdynamic LV systolic function.  3.  High resting heart rate.     Plan:  1.  Recommend addition of diltiazem CD 180 mg daily.  To accommodate addition of diltiazem reduce Ranexa to 500 mg twice daily.  Also would decrease lisinopril to 20 mg daily to allow for enough blood pressure to accommodate addition of diltiazem.  Diltiazem may be helpful should there be a component of microvascular angina.     Cardiac cath 02/18/21 ([facility])   Angiographic Findings:  Right dominant system   Left Main:  Normal     Left Anterior Descending:  Patent proximal stent. Mid 30% stenosis,   irregular appearing in some views. FFR 0.89.   Circumflex:  No obstructive disease   Right Coronary:  No obstructive disease   Left  Ventriculogram:  LVEF 55-60%     Conclusions:   -No obstructive CAD   Recommendations:   -Follow-up with GI   -Continue medical therapy      Lexiscan myoview 01/29/21 ([facility])  Interpetation Summary:   1. No no change of baseline chest pain after regadenoson injection.   2. No evidence of stress-induced ischemia on EKG.   3. There is evidence of ischemia in the territory subtended by the left anterior descending coronary artery.   4. Normal LV systolic function.          Carotid duplex 11/12/20: [facility])      o 50-69% stenosis of the right internal carotid artery based on velocity criteria.      o No hemodynamically significant lesions are identified on the left internal or common carotid(s).      o There is antegrade flow demonstrated in the right vertebral artery.      o The left vertebral artery could not be visualized.      Echocardiogram 05/27/20: ([facility])     The left ventricle is  normal in size.   There is normal left ventricular wall thickness.   Left ventricular systolic function is normal. (NGEX>/=52%)   Overall left ventricular ejection fraction is estimated to be 60-65%.   Normal Diastolic Function.   No significant valvular abnormalities identified.            Procedure:          LHC 08/09/19  Indication:          Unstable angina     Procedure Details:  Consent Access Bleed R Sedat Start Stop Versed Fentanyl Contrast Fluoro EBL Comp Spec   Yes RRA low MCSFC 1457 1522 4 150 51 1.9 <20 None None   *Ultrasound Note: Ultrasound guidance used to determine aforementioned artery patency, size (>27mm), anatomic variations and ideal puncture location.  Real-time ultrasound utilized concurrent with vascular needle entry into the artery.  Image(s) permanently recorded and reported in the patient chart.  *Sedation Note: MCSFC = minimal conscious sedation for comfort     Findings:  Artery Findings/Result   LM Normal   LAD Widely patent stent, 50% ostial D1 jailed by stent (no change)   Cx Normal   RI NA    RCA Normal   LVEDP 6   LVG 60%      Intervention:         None     Post Cath Dx:       Nonobstructive CAD, patent stent LAD     Cardiac Cath 05/07/17:  Anatomy:   LM-normal  LAD-normal, widely patent stent mid  Cx-normal  OM1- normal  RCA-normal, right dominant  RPDA- normal  LVEF- 60%     Impression:  1.  Normal coronary artery anatomy.  2.  Widely patent mid LAD stent.  3.  Normal LV systolic function.  4.  False positive stress test.  Likely breast artifact.     Plan:  1.  Medical management.  2.  She needs workup of noncardiac causes of chest pain.  She now has to stress test with similar abnormalities most consistent with breast artifact.  The majority of her coronary angiograms have demonstrated normal anatomy except for the one that had a 70% mid LAD stenosis which was stented.  Her chest pain has atypical features.  Recommend rheumatology and possibly orthopedic evaluation.     Echo 08/05/16 ([address] [place], [place])  LV with normal systolic function,  EF 55-60%  Mild MR with multiple jets  Very mild AS  Mild TR     Cardiac Cath 07/17/16:  Anatomy:   LM-normal  LAD-widely patent stent otherwise normal  Cx-normal, dominant  OM1- normal  RCA-normal  LVEF- 70%, LVEDP 15 mmHg     Impression:  1.  Widely patent LAD stent otherwise normal coronary arteries  2.  Hyperdynamic LV systolic function  3.  Non-cardiac chest pain     Plan:  1.  Medical management     Cardiac Cath 03/31/16:  Anatomy:   LM-normal   LAD-70% mid with FFR 0.77  Cx-normal, codominant  OM1- normal  RCA-normal  RPDA- normal  LVEF- 70%, LVEDP 19   PCI: LAD 70% to 0% with 3.25 mm x 18 mm Xience Alpine drug-eluting stent     Impression:  1.  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  2.  Hyperdynamic LV systolic function.  3.  Mildly decompensated diastolic CHF.  4.  Moderate pulmonary hypertension.     Cardiac cath 09/23/15: normal coronary arteries  Assessment/Plan:  Principal Problem:    Chronic  diastolic heart failure (HCC)  Pro BNP 65  EF 55-60%  Plan: appears compensated      Chest pain  Trop 15, 16  H/o PCI to LAD, widely patent by [facility] '22   Plan:  LHC tomorrow, NPO at midnight.  If no significant epicardial CAD, consider outpatient stress PET at [facility] or UC      Dyslipidemia  Plan: statin       AKI (acute kidney injury) (HCC)  Cr 1.4, 1.3  Plan: Hold diuretics.  Gentle hydration.      Essential hypertension  Plan: controlled       Scribe's Attestation: This note was scribed in the presence of Dr. Wonda Cerise, MD by Leanor Rubenstein, RN.     Physician Attestation: The scribe's documentation has been prepared under my direction and personally reviewed by me in its entirety.  I confirm that the note above accurately reflects all work, treatment, procedures, and medical decision making performed by me.      Thank you for allowing [place] to participate in the care of Rachael Mays. Further evaluation will be based upon the patient's clinical course and testing results.    All questions and concerns were addressed to the patient/family. Alternatives to my treatment were discussed. The note was completed using EMR. Every effort was made to ensure accuracy; however, inadvertent computerized transcription errors may be present.    Blake Divine.D.

## 2023-04-01 NOTE — Progress Notes (Signed)
[facility] - Inpatient Rehabilitation Department   Phone: [phone]    Physical Therapy    [x]  Initial Evaluation            []  Daily Treatment Note         [x]  Discharge Summary      Patient: Rachael Mays   DOB: [birth year]   MRN: [id number]   Date of Service:  04/01/2023  Admitting Diagnosis: Chronic diastolic heart failure (HCC)  Current Admission Summary: Rachael Mays is a 67 y.o. female who presents to the hospital with couple day history of shortness of breath associated midsternal chest pressure patient woke up this night with nausea and vomiting and midsternal chest pressure no fevers chills no cough no sick contacts no recent travel patient is legs are slightly swollen. Patient does not smoke or drink but does have a history of blood clots in the past   Past Medical History:  has a past medical history of AKI (acute kidney injury) (HCC), Anxiety, Asthma, Blood circulation, collateral, CAD (coronary artery disease), CHF (congestive heart failure) (HCC), Chronic back pain, Deep vein thrombosis, Degeneration of thoracic intervertebral disc, Depression, GERD (gastroesophageal reflux disease), GERD (gastroesophageal reflux disease), Hx of blood clots, Hyperlipidemia, Hypertension, Idiopathic peripheral neuropathy, Melanoma (HCC), MRSA (methicillin resistant staph aureus) culture positive, Obesity, Obstructive sleep apnea, Obstructive sleep apnea (adult) (pediatric), Pulmonary embolism (HCC), Stenosis of right carotid artery, and Type 2 diabetes mellitus with diabetic polyneuropathy, without long-term current use of insulin (HCC).  Past Surgical History:  has a past surgical history that includes Colonoscopy; Cesarean section; Cholecystectomy; shoulder surgery; Lap Band (05/01/2008); back surgery; Colonoscopy (04/08/2010); other surgical history; Upper gastrointestinal endoscopy (05/19/2013); other surgical history (07/05/2013); Upper gastrointestinal endoscopy (07/21/2013); Upper gastrointestinal  endoscopy (N/A, 07/31/2014); vascular surgery (04/2015); Diagnostic Cardiac Cath Lab Procedure; Coronary angioplasty; Endoscopy, colon, diagnostic; Breast surgery; Dilatation, esophagus; Skin cancer excision (12/29/2017); Hysterectomy, vaginal; Hysterectomy; Upper gastrointestinal endoscopy (N/A, 04/18/2021); Colonoscopy (N/A, 04/18/2021); and cervical fusion.  Discharge Recommendations: Rachael Mays scored a 24/24 on the AM-PAC short mobility form.  At this time, no further PT is recommended upon discharge due to patient at independent level.  Recommend patient returns to prior setting with prior services.    DME Required For Discharge: No new DME required  Precautions/Restrictions: no restrictions  Positional Restrictions:no positional restrictions    Pre-Admission Information   Lives With:  roommate                        Type of Home: house  Home Layout: tri-level, stays on the main floor with bathroom and bedroom  Home Access:  3 step to enter with handrail.  Handrails are located on (R) side.  Bathroom Layout: Cabin crew: hand held Doctor, hospital Height: elevated height  Home Equipment: rollator - 4 wheeled walker, single point cane, crutches  Transfer Assistance: Independent without use of device  Ambulation Assistance:Independent without use of device  ADL Assistance: independent with all ADL's  IADL Assistance: independent with homemaking tasks  Active Driver:       [x]   Yes                 []  No  Hand Dominance: []  Left                 [x]  Right  Current Employment: retired.  Occupation: Statistician   Hobbies: play with grand kids, spend time with daughters   Recent  Falls: denies hx of recent falls      Recently got out of a walking boot on the (L) foot due to bone spurs      Examination   Vision:   Vision Gross Assessment: Impaired and Vision Corrective Device: wears glasses for reading  Hearing:   [facility]  Observation:   Patient on RA during session  Posture:   fair  Sensation:    reports numbness and tingling in all extremities--neuropathy   ROM:   (B) LE ROM WFL  Strength:   (B) LE gross strength WFL  Therapist Clinical Decision Making (Complexity): low complexity  Clinical Presentation: stable      Subjective  General: Patient is seated at the edge of bed on arrival, rates 4/10 pain, though is agreeable to engagement in therapy evaluation.  Pain: 4/10.  Location: above bottom cheek and (L) leg   Pain Interventions: pain medication in place prior to arrival       Functional Mobility  Bed Mobility  Bed mobility not completed on this date.  Comments:  Transfers  Sit to stand transfer: Independent  Stand to sit transfer: Independent  Comments:  Ambulation  Surface:level surface  Assistive Device: no device  Assistance: Independent  Distance: 100' + 300'  Gait Mechanics: normal cadence, base of support, appropriate step length and step height   Comments:    Stair Mobility  Number of Steps: 12  Step Height: 6 inch  Hand Rails: (R) ascending handrail  Assistance: modified independent  Comments: reciprocal pattern   Balance  Static Sitting Balance: good: independent with functional balance in unsupported position  Dynamic Sitting Balance: good: independent with functional balance in unsupported position  Static Standing Balance: good: independent with functional balance in unsupported position  Dynamic Standing Balance: good(+): independent with high level dynamic balance in unsupported position  Comments:    Other Therapeutic Interventions    Tap ups onto 6'' step x 30 seconds with CGA  Standing marches/punches x 30 seconds each with SPO2 remaining above 90%    Education provided regarding resources for sciatica therapy and also POC to continue treatment for plantar fasciitis.     Functional Outcomes  AM-PAC Inpatient Mobility Raw Score : 24              Cognition  WFL  Orientation:    A&O x 4    Education  Barriers To Learning: none  Patient Education: patient educated on discharge  recommendations  Learning Assessment:  Pt verbalized and demonstrates understanding    Assessment  Activity Tolerance: no adverse response to therapy--SPO2 ranging 93-96%  Impairments Requiring Therapeutic Intervention: none - eval with same day discharge  Prognosis: good without need for therapy intervention  Clinical Assessment: Patient presenting at independent level for completion of required mobility tasks for return to home.  Eval with d/c at this time.  No therapy services indicated.    Safety Interventions: patient left in bed, call light within reach, and nurse notified    Plan  Frequency: Eval with same day discharge.  No follow up required.  Current Treatment Recommendations: Not applicable, evaluation completed with same day discharge.    Goals  Patient eval with same day discharge.  No goals set as patient is at baseline mobility status.      Therapy Session Time      Individual Group Co-treatment   Time In     1057   Time Out     1143   Minutes  46     Timed Code Treatment Minutes:   31 minutes  Total Treatment Minutes:  46 minutes       Electronically Signed By: Dannette Barbara, PT    Dannette Barbara PT, DPT [phone]

## 2023-04-01 NOTE — Plan of Care (Signed)
Problem: Safety - Adult  Goal: Free from fall injury  Outcome: Progressing     Problem: Chronic Conditions and Co-morbidities  Goal: Patient's chronic conditions and co-morbidity symptoms are monitored and maintained or improved  Outcome: Progressing     Problem: Respiratory - Adult  Goal: Achieves optimal ventilation and oxygenation  Outcome: Progressing     Problem: Skin/Tissue Integrity - Adult  Goal: Skin integrity remains intact  Outcome: Progressing     Problem: Pain  Goal: Verbalizes/displays adequate comfort level or baseline comfort level  Outcome: Progressing

## 2023-04-01 NOTE — Progress Notes (Signed)
04/01/23 0941   Resting (Room Air)   SpO2 96   HR 98   During Walk (Room Air)   SpO2 98   HR 95   Walk/Assistance Device Ambulation   Rate of Dyspnea 0   After Walk   SpO2 96   HR 95   Rate of Dyspnea 0   Does the Patient Qualify for Home O2 No   Does the Patient Need Portable Oxygen Tanks No

## 2023-04-01 NOTE — Progress Notes (Signed)
Nutrition Education    Provided heart failure diet education. Pt reported hx of CHF for a few years but has not been educated on diet guidelines before. Education included low sodium diet guidelines (2-3 gm Na+/day) and fluid restriction (64 oz/day). Reviewed foods to choose and foods to avoid, along with label reading and ways to add flavor to food.  Pt does not add salt to food. Pt states understanding of education. Time spent with patient: 10 minutes.      Educated on 4/25  Learners: Patient  Readiness: Acceptance  Method: Explanation and Handout  Response: Verbalizes Understanding  Contact name and number provided.    Harvie Junior, MS, RD, LD  Contact Number: [phone]

## 2023-04-01 NOTE — Care Coordination-Inpatient (Signed)
Discharge Planning Note:    Chart reviewed and it appears that patient has minimal needs for discharge at this time.    Primary Care Physician is Maximino Sarin, DO    Primary insurance is [employer]     Please notify case management if any discharge needs are identified.      Case management will continue to follow progress and update discharge plan as needed.     Electronically signed by Richardson Dopp, MSW on 04/01/2023 at 2:47 PM

## 2023-04-02 LAB — MAGNESIUM: Magnesium: 2 mg/dL (ref 1.80–2.40)

## 2023-04-02 LAB — POCT GLUCOSE
POC Glucose: 118 mg/dl — ABNORMAL HIGH (ref 70–99)
POC Glucose: 139 mg/dl — ABNORMAL HIGH (ref 70–99)
POC Glucose: 143 mg/dl — ABNORMAL HIGH (ref 70–99)
POC Glucose: 151 mg/dl — ABNORMAL HIGH (ref 70–99)
POC Glucose: 184 mg/dl — ABNORMAL HIGH (ref 70–99)

## 2023-04-02 LAB — BASIC METABOLIC PANEL
Anion Gap: 9 (ref 3–16)
BUN: 26 mg/dL — ABNORMAL HIGH (ref 7–20)
CO2: 30 mmol/L (ref 21–32)
Calcium: 9.7 mg/dL (ref 8.3–10.6)
Chloride: 99 mmol/L (ref 99–110)
Creatinine: 1.4 mg/dL — ABNORMAL HIGH (ref 0.6–1.2)
Est, Glom Filt Rate: 42 — AB (ref >60)
Glucose: 119 mg/dL — ABNORMAL HIGH (ref 70–99)
Potassium: 4.1 mmol/L (ref 3.5–5.1)
Sodium: 138 mmol/L (ref 136–145)

## 2023-04-02 LAB — BRAIN NATRIURETIC PEPTIDE: Pro-BNP: <36 pg/mL (ref 0–124)

## 2023-04-02 MED ORDER — POLYETHYLENE GLYCOL 3350 17 G PO PACK
17 g | Freq: Every day | ORAL | Status: DC
Start: 2023-04-02 — End: 2023-04-06
  Administered 2023-04-02 – 2023-04-06 (×5): 17 g via ORAL

## 2023-04-02 MED ORDER — PEG 3350-KCL-NABCB-NACL-NASULF 236 G PO SOLR
236 | Freq: Once | ORAL | Status: AC
Start: 2023-04-02 — End: 2023-04-02
  Administered 2023-04-02: 21:00:00 4000 mL via ORAL

## 2023-04-02 MED FILL — RANOLAZINE ER 500 MG PO TB12: 500 MG | ORAL | Qty: 1

## 2023-04-02 MED FILL — GAVILYTE-G 236 G PO SOLR: 236 g | ORAL | Qty: 4000

## 2023-04-02 MED FILL — ROSUVASTATIN CALCIUM 40 MG PO TABS: 40 MG | ORAL | Qty: 1

## 2023-04-02 MED FILL — OXYCODONE-ACETAMINOPHEN 7.5-325 MG PO TABS: ORAL | Qty: 1

## 2023-04-02 MED FILL — HEALTHYLAX 17 G PO PACK: 17 g | ORAL | Qty: 1

## 2023-04-02 MED FILL — LOVENOX 30 MG/0.3ML IJ SOSY: 30 MG/0.3ML | INTRAMUSCULAR | Qty: 0.3

## 2023-04-02 MED FILL — ONDANSETRON HCL 4 MG/2ML IJ SOLN: 4 MG/2ML | INTRAMUSCULAR | Qty: 2

## 2023-04-02 MED FILL — GABAPENTIN 300 MG PO CAPS: 300 MG | ORAL | Qty: 1

## 2023-04-02 MED FILL — OS-CAL CALCIUM + D3 500-5 MG-MCG PO TABS: 500-5 MG-MCG | ORAL | Qty: 2

## 2023-04-02 MED FILL — ASPIRIN LOW DOSE 81 MG PO TBEC: 81 MG | ORAL | Qty: 1

## 2023-04-02 MED FILL — MAGNESIUM OXIDE -MG SUPPLEMENT 400 (240 MG) MG PO TABS: 400 (240 Mg) MG | ORAL | Qty: 1

## 2023-04-02 MED FILL — ISOSORBIDE MONONITRATE ER 30 MG PO TB24: 30 MG | ORAL | Qty: 1

## 2023-04-02 MED FILL — MONTELUKAST SODIUM 10 MG PO TABS: 10 MG | ORAL | Qty: 1

## 2023-04-02 MED FILL — ASPERCREME LIDOCAINE 4 % EX PTCH: 4 % | CUTANEOUS | Qty: 1

## 2023-04-02 MED FILL — PANTOPRAZOLE SODIUM 40 MG PO TBEC: 40 MG | ORAL | Qty: 1

## 2023-04-02 MED FILL — VITAMIN D3 25 MCG (1000 UT) PO TABS: 25 MCG (1000 UT) | ORAL | Qty: 2

## 2023-04-02 MED FILL — DILTIAZEM HCL ER COATED BEADS 180 MG PO CP24: 180 MG | ORAL | Qty: 1

## 2023-04-02 NOTE — Plan of Care (Signed)
Problem: Safety - Adult  Goal: Free from fall injury  04/02/2023 0147 by Anastasia Pall, RN  Outcome: Progressing   Standard safety measures in place.       Problem: Chronic Conditions and Co-morbidities  Goal: Patient's chronic conditions and co-morbidity symptoms are monitored and maintained or improved  04/02/2023 0147 by Anastasia Pall, RN  Outcome: Progressing   CHF education given including daily weights and fluid intake.

## 2023-04-02 NOTE — Plan of Care (Signed)
Elenore Rota, MD                      Athena Masse, MD          Selena Batten, MD                Office: [phone]                      Fax: [phone]               [email or web address]                     Nephrology consult received.  But this patient will be covered by [facility] group, so I have forwarded this consult to their Covering Dr. Sherryll Burger.    Thank you for contacting us to participate in this patient's care.        Selena Batten, MD  [facility] [place]   [phone] or Via [employer] App.

## 2023-04-02 NOTE — Consults (Signed)
[facility]  HEART FAILURE PROGRAM      KYMBERLEE VIGER 12-Jul-1956    History:  Past Medical History:   Diagnosis Date    AKI (acute kidney injury) (HCC) 01/26/2022    Anxiety     Asthma     Blood circulation, collateral     CAD (coronary artery disease)     CHF (congestive heart failure) (HCC)     Chronic back pain     Deep vein thrombosis     Degeneration of thoracic intervertebral disc 10/15/2017    Depression     GERD (gastroesophageal reflux disease)     NO LONGER SINCE LAP    GERD (gastroesophageal reflux disease) 01/23/2009    Hx of blood clots     Hyperlipidemia     hx; resolved with lap band    Hypertension     Idiopathic peripheral neuropathy 03/26/2017    Melanoma (HCC)     upper back    MRSA (methicillin resistant staph aureus) culture positive 10/17/2019    leg abscess    Obesity     hx of; had lap band    Obstructive sleep apnea 09/18/2015    uses bi-pap    Obstructive sleep apnea (adult) (pediatric) 09/18/2015    Pulmonary embolism (HCC)     Stenosis of right carotid artery 06/16/2021    Type 2 diabetes mellitus with diabetic polyneuropathy, without long-term current use of insulin (HCC) 01/26/2022       ECHO:/  04/01/23  Summary   Technically difficult study due to body habitus. Definity was administered.   Left ventricular cavity size is normal with normal left ventricular wall   thickness. Ejection fraction is visually estimated to be 55-60%. Grade I   diastolic dysfunction with normal LV filling pressures. E/e' = 11.1.   Non-coronary cusp appears restricted.   Mild aortic stenosis with a peak velocity of 2.69m/s and a mean pressure   gradient of . AVA (VTI): 2.18 cm.2.   RVSP is estimated to be 21-24 mmHG.   IVC not well visualized.   Right atrium is not well visualized, but appears normal in size.   Right ventricle is not well visualized, but appears dilated.   There appears to be some calcification of the posterior mitral valve leaflet   without evidence of stenosis.        ACE/ARB/ARNi: not on ckd  BB: not on  Aldosterone Antagonist: aldactone 25 mg daily- on hold   SGLT2: not on    History of sleep apnea: Yes  Type: osa ---wears bipap at night      DM History: Yes  Consult for DM educator: No      Last Hospital Admission: 10/16/19 with cellulitis of leg  Code Status: full  Discharge plans: from home    Family Present: no    Sierria Bruney was admitted to the hospital with increased shortness of breath. Patient has been seen in the past for education and follows with Dr. Berna Bue as an outpatient as well as nephrology. She does not weigh daily, is unsure of baseline. Feels she can tell when she is retaining fluids. She has had diuretics adjusted as an outpatient. Recently increased. Does not add salt to foods. She tries to avoid processed items. Feels confused about fluid restriction. Has been informed of conflicting information regarding needing to follow. She is compliant with her medications and with follow up visits.    Patient provided with both written and verbal education on  CHF signs/ symptoms, causes, discharge medications, daily weights, low sodium diet, activity, and follow-up.  Pt to call if gains 3 pounds in one day or 5 pounds in one week. Mutually agreed upon goals were discussed such as calling the MD as soon as they recognize symptoms and weight gain, maintaining proper diet, taking medications as prescribed, joining cardiac rehab when able. Also reviewed importance of risk factor reduction. Patient provided with CHF Zone Management tool and CHF symptoms magnet.    Discussed importance of lifestyle changes: encouraged fluid limit    PATIENT/CAREGIVER TEACHING:    Level of patient/caregiver understanding able to:   [x ] Verbalize understanding [ ]  Demonstrate understanding [ ]  Teach back   [ ]  Needs reinforcement [ ]  Other:       Time spent teaching: 30 mins    1. WEIGHT: Admit Weight - Scale: 133.8 kg (295 lb)      Today  Weight - Scale: (!) 138.7 kg (305 lb 11.2 oz)    2. I/O   Intake/Output Summary (Last 24 hours) at 04/02/2023 1310  Last data filed at 04/02/2023 0440  Gross per 24 hour   Intake 240 ml   Output 600 ml   Net -360 ml       Recommendations:   1. Patient will need a one week or less hospital follow up scheduled before discharge.   2. Educate further on fluid restriction 48 oz- 64 oz during inpatient stay so they can understand how to measure intake at home.   3. Continue to educate on S/S.   4. Emphasize daily weights, diet, and knowing when and who to call  5. Provided patient with CHF Resource Line for questions and concerns.         Babara Buffalo, RN 04/02/2023 1:10 PM

## 2023-04-02 NOTE — Progress Notes (Signed)
Shift assessment completed. Routine vitals stable. Scheduled medications given. Patient is awake, alert and oriented. Respirations are easy and unlabored. Patient does not appear to be in distress, resting comfortably at this time. Call light within reach. No further needs expressed.

## 2023-04-02 NOTE — Progress Notes (Signed)
Patient is alert and oriented x4, VSS. Denies pain. NPO since midnight. No chest pain. No n/v. No shortness of breath. Lungs clear. Satting well on room air. No needs at this time.

## 2023-04-02 NOTE — Consults (Signed)
Gastroenterology Consult Note        Patient: Rachael Mays  DOB: 11/15/1956  Acct#:      Date:  04/02/2023      1. Acute on chronic congestive heart failure, unspecified heart failure type (HCC)    2. Shortness of breath    3. Dizziness    4. Elevated troponin        Subjective:       History of Present Illness  Patient is a 67 y.o.  female admitted with Shortness of breath [R06.02]  Chronic diastolic heart failure (HCC) [I50.32]  Dizziness [R42]  Elevated troponin [R79.89]  Acute on chronic congestive heart failure, unspecified heart failure type (HCC) [I50.9] who is seen in consult for nausea.  History of CHF, CAD, sleep apnea, CKD, PE, diabetes.  Had prior lap band with erosion into the GE junction in 2014.  Lap band was removed.  She has had some intermittent dysphagia thereafter and had a small distal esophageal diverticulum likely remnant from that.  She was treated with PPI.  History of constipation on daily MiraLAX. Follows with Dr. Daphine Deutscher.    She had some dizziness last week.  Then over the weekend she had shortness of breath, chest pressure.  Also had mid to lower abdominal aching pain which was constant and not any better or worse with anything in particular including bowel movements.  Has been having daily bowel movements.  No black or bloody BMs.  Had some nausea but not a lot.  Denies any vomiting.  No GERD symptoms on Prilosec.        Past Medical History:   Diagnosis Date    AKI (acute kidney injury) (HCC) 01/26/2022    Anxiety     Asthma     Blood circulation, collateral     CAD (coronary artery disease)     CHF (congestive heart failure) (HCC)     Chronic back pain     Deep vein thrombosis     Degeneration of thoracic intervertebral disc 10/15/2017    Depression     GERD (gastroesophageal reflux disease)     NO LONGER SINCE LAP    GERD (gastroesophageal reflux disease) 01/23/2009    Hx of blood clots     Hyperlipidemia     hx; resolved with lap band    Hypertension     Idiopathic  peripheral neuropathy 03/26/2017    Melanoma (HCC)     upper back    MRSA (methicillin resistant staph aureus) culture positive 10/17/2019    leg abscess    Obesity     hx of; had lap band    Obstructive sleep apnea 09/18/2015    uses bi-pap    Obstructive sleep apnea (adult) (pediatric) 09/18/2015    Pulmonary embolism (HCC)     Stenosis of right carotid artery 06/16/2021    Type 2 diabetes mellitus with diabetic polyneuropathy, without long-term current use of insulin (HCC) 01/26/2022      Past Surgical History:   Procedure Laterality Date    BACK SURGERY      neck  plates    BREAST SURGERY      left lumpectomy    CERVICAL FUSION      CESAREAN SECTION      CHOLECYSTECTOMY      COLONOSCOPY      COLONOSCOPY  04/08/2010    COLONOSCOPY N/A 04/18/2021    COLONOSCOPY DIAGNOSTIC performed by Kandice Robinsons, MD at [facility] ENDOSCOPY  CORONARY ANGIOPLASTY  03/31/2016    mid LAD 3.25x18 alpine    DIAGNOSTIC CARDIAC CATH LAB PROCEDURE  08/09/2019    normal    DILATATION, ESOPHAGUS      ENDOSCOPY, COLON, DIAGNOSTIC      HYSTERECTOMY (CERVIX STATUS UNKNOWN)      HYSTERECTOMY, VAGINAL      LAP BAND  05/01/2008    Dr. Suzi Roots    OTHER SURGICAL HISTORY      [place] Filter: curently in place    OTHER SURGICAL HISTORY  07/05/2013    lap band removal    SHOULDER SURGERY      SKIN CANCER EXCISION  12/29/2017    UPPER GASTROINTESTINAL ENDOSCOPY  05/19/2013    UPPER GASTROINTESTINAL ENDOSCOPY  07/21/2013    ESOPHAGEAL STENT PLACEMENT    UPPER GASTROINTESTINAL ENDOSCOPY N/A 07/31/2014    Esophagogastroduodenoscopy with esophageal balloon dilation    UPPER GASTROINTESTINAL ENDOSCOPY N/A 04/18/2021    EGD DILATION BALLOON performed by Kandice Robinsons, MD at [facility] ENDOS[facility]    VASCULAR SURGERY  04/2015    Donzetta Sprung, celiac artery angiogram, normal abd arteries      Past Endoscopic History    EGD and colonoscopy with Dr Daphine Deutscher 04/2021    INDICATION : Dysphagia, esophageal - R13.14  Heartburn - R12  IMPRESSION : 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  Empiric balloon dilation of distal esophagus  Otherwise normal EGD  PLAN : Diet and Antireflux regimen given orally and in writing, in  detail  Continue present medications  Schedule CT of chest and abdomen with po contrast  Follow up in office in 6 months  Proceed with colonoscopy today    INDICATION : Colon cancer screening - Z12.11  History of colon polyps - Z86.010   IMPRESSION : Diverticulosis of large intestine - K57.30  PLAN : High fiber diet  Continue present treatment.  Trial of Linzess 145 mcg po daily  CT Chest and abdomen to evaluate chest pain.  Colonoscopy in 5 years  Followup in six months, or as needed  Followup with referring physician as previously instucted    Admission Meds  No current facility-administered medications on file prior to encounter.     Current Outpatient Medications on File Prior to Encounter   Medication Sig Dispense Refill    torsemide (DEMADEX) 20 MG tablet Take 1 tablet by mouth daily 90 tablet 0    dilTIAZem (CARDIZEM CD) 180 MG extended release capsule TAKE ONE CAPSULE BY MOUTH DAILY 90 capsule 3    spironolactone (ALDACTONE) 25 MG tablet TAKE 1 TABLET BY MOUTH DAILY 30 tablet 3    fluocinonide (LIDEX) 0.05 % external solution APPLY SPARINGLY TO THE BACK OF THE SCALP DAILY AS NEEDED FOR ITCHING 60 mL 1    ketoconazole (NIZORAL) 2 % cream Apply to the rash on the upper back twice daily for 2 weeks. (Patient not taking: Reported on 03/31/2023) 30 g 2    gabapentin (NEURONTIN) 100 MG capsule Take 1 capsule by mouth nightly.      metFORMIN (GLUCOPHAGE) 500 MG tablet Take 1 tablet by mouth 2 times daily (with meals) (Patient not taking: Reported on 03/31/2023)      isosorbide mononitrate (IMDUR) 30 MG extended release tablet Take 1 tablet by mouth daily 90 tablet 3    nitroGLYCERIN (NITROSTAT) 0.4 MG SL tablet up to max of 3 total doses. If no relief after 1 dose, call 911. 25 tablet 3  ranolazine  (RANEXA) 500 MG extended release tablet TAKE ONE TABLET BY MOUTH TWICE A DAY 180 tablet 3    clobetasol (TEMOVATE) 0.05 % cream Apply to affected area twice daily for up to 2 weeks or until improved. 60 g 1    omeprazole (PRILOSEC) 40 MG delayed release capsule TAKE ONE CAPSULE BY MOUTH DAILY 30 capsule 0    triamcinolone (KENALOG) 0.1 % cream Apply to affected area twice daily for up to 2 weeks or until improved. 15 g 2    topiramate (TOPAMAX) 25 MG tablet Take 2 tablets by mouth 2 times daily (Patient not taking: Reported on 03/31/2023)      montelukast (SINGULAIR) 10 MG tablet TAKE ONE TABLET BY MOUTH DAILY. **MUST CALL MD FOR APPOINTMENT 30 tablet 5    oxyCODONE-acetaminophen (PERCOCET) 7.5-325 MG per tablet as needed.      rosuvastatin (CRESTOR) 20 MG tablet daily      magnesium (MAGNESIUM-OXIDE) 250 MG TABS tablet Take 350 mg by mouth daily      Coenzyme Q10 (CO Q10) 200 MG CAPS Take by mouth daily (with breakfast)      calcium citrate-vitamin D (CITRICAL + D) 315-250 MG-UNIT TABS per tablet Take 2 tablets by mouth 2 times daily      diclofenac sodium (VOLTAREN) 1 % GEL APPLY 4 GRAMS TO AFFECTED AREA(S) TWO TIMES A DAY (Patient not taking: Reported on 03/31/2023) 100 g 1    Cholecalciferol (VITAMIN D3) 50 MCG (2000 UT) CAPS Take 1 capsule by mouth daily 90 capsule 3    albuterol sulfate HFA (PROVENTIL HFA) 108 (90 Base) MCG/ACT inhaler Inhale 2 puffs into the lungs every 6 hours as needed for Wheezing 3 Inhaler 3    lidocaine (LIDODERM) 5 % Place 1 patch onto the skin daily 12 hours on, 12 hours off. (Patient taking differently: Place 1 patch onto the skin as needed 12 hours on, 12 hours off.) 30 patch 0    aspirin 81 MG EC tablet Take 1 tablet by mouth daily 30 tablet 11    Multiple Vitamins-Minerals (MULTIVITAMIN PO) Take 1 tablet by mouth daily          Allergies  Allergies   Allergen Reactions    Ibuprofen      Due to lap band erosion    Lisinopril      Memory loss     Seasonal     Talwin [Pentazocine] Other  (See Comments)     dizzy      Social   Social History     Tobacco Use    Smoking status: Former     Current packs/day: 0.00     Average packs/day: 0.5 packs/day for 40.0 years (20.0 ttl pk-yrs)     Types: Cigarettes     Start date: 08/07/1972     Quit date: 08/07/2012     Years since quitting: 10.6    Smokeless tobacco: Never   Substance Use Topics    Alcohol use: No     Alcohol/week: 0.0 standard drinks of alcohol        Family History   Problem Relation Age of Onset    Arthritis Mother     Cancer Mother     Depression Mother     High Blood Pressure Mother     Heart Disease Mother 46        MI     Ovarian Cancer Mother 97    Cancer Father 75  colon    Other Sister         OSA    Cancer Sister         lung liver    Other Brother         OSA    Sleep Apnea Brother     Other Brother         OSA    Sleep Apnea Brother     Heart Disease Maternal Grandmother     Early Death Paternal Grandmother     Heart Disease Paternal Grandfather     Other Daughter         OSA    Diabetes Other     High Blood Pressure Other     Obesity Other                 Physical Exam  Blood pressure 117/67, pulse 56, temperature 98.2 F (36.8 C), temperature source Oral, resp. rate 16, weight (!) 138.7 kg (305 lb 11.2 oz), SpO2 96 %, not currently breastfeeding.    General appearance: alert, cooperative, no distress, appears stated age  Eyes: Anicteric  Head: Normocephalic, without obvious abnormality  Lungs: clear to auscultation bilaterally, Normal Effort  Heart: regular rate and rhythm, normal S1 and S2, no murmurs or rubs  Abdomen: obese, soft, non-tender. Bowel sounds normal. No masses,  no organomegaly.   Extremities: atraumatic, no cyanosis or edema  Skin: warm and dry, no jaundice  Neuro: Grossly intact, A&OX3  Musculoskeletal: 5/5 grip strength BUE      Data Review:    Recent Labs     03/31/23  1601 04/01/23  0529   WBC 7.2 6.7   HGB 13.5 12.8   HCT 41.5 39.1   MCV 87.6 87.5   PLT 187 170     Recent Labs     03/31/23  1601  04/01/23  0529 04/02/23  0537   NA 138 143 138   K 4.2 4.2 4.1   CL 101 105 99   CO2 25 26 30    BUN 21* 23* 26*   CREATININE 1.4* 1.3* 1.4*     Recent Labs     03/31/23  1601 04/01/23  0529   AST 21 19   ALT 16 14   BILITOT 0.5 0.5   ALKPHOS 100 94     No results for input(s): "LIPASE", "AMYLASE" in the last 72 hours.  Recent Labs     03/31/23  1601   PROTIME 14.1   INR 1.06     No results for input(s): "PTT" in the last 72 hours.  No results for input(s): "OCCULTBLD" in the last 72 hours.    Imaging Studies:                            CTA chest 03/31/23  FINDINGS:  Pulmonary Arteries: Pulmonary arteries are adequately opacified for  evaluation.  No evidence of intraluminal filling defect to suggest pulmonary  embolism.  Main pulmonary artery is normal in caliber.     Mediastinum: No evidence of mediastinal lymphadenopathy.  The heart and  pericardium demonstrate no acute abnormality.  There is no acute abnormality  of the thoracic aorta.     Lungs/pleura: The lungs are without acute process.  No focal consolidation or  pulmonary edema.  Calcified granuloma in the right lower lobe.  No evidence  of pleural effusion or pneumothorax.     Upper Abdomen: There is  a stable 1.6 cm indeterminate right adrenal nodule.  Limited images of the upper abdomen are otherwise unremarkable.     Soft Tissues/Bones: No acute bone or soft tissue abnormality.     IMPRESSION:  No evidence of pulmonary embolism or acute pulmonary abnormality.               Assessment/Plan:     Mid to lower abdominal pain, nausea without vomiting -she does have history of constipation but reports having regular bowel movements on MiraLAX.  - PPI  - golyely to clear constipation  - then daily miralax    Chest pain/pressure-cardiology planning left heart cath on Monday if renal function improves    CKD -nephrology following.    Discussed with Dr. Jefm Miles, PA-C  [facility]    ==============================================  I have personally  performed a face to face diagnostic evaluation on this patient. I have spent more than 50% of the total clinical encounter in interviewing/examining the patient, reviewing patient chart (including but not limited to notes, labs, imaging and other testing), documentation of findings and subsequent follow up of ordered medication and testing, placing referrals and communication with patient care providers, coordinating future care, as well as documentation in the EHR. I agree with the findings and recommended plan of care, as documented by the physician assistant/nurse practitioner.  In summary, my findings and plan are the following:     67 year old Caucasian female, morbidly obese.  She has CHF, CAD, sleep apnea, PE and diabetes.  Had previous lap band that was removed.  Had intermittent dysphagia since then.  She has chronic constipation and only takes daily MiraLAX.  She is admitted with dizziness, shortness of breath, chest pressure, nausea, lower abdominal pain.  CT chest was negative for PE cardiology plans to do a left heart cath on Monday since her creatinine went up.  Vital signs are stable.  Abdominal exams are benign  Impressions:   1.  Abdominal pain with nausea with a history of chronic constipation.  Patient might have a large stool burden causing upstream nausea and lower abdominal pain.    2.  Chest pain and shortness of breath, planning for left heart cath on Monday once creatinine.  Plans: 1/2 gallon GoLytely for bowel evacuation cystoscopy bowel purge), followed by daily MiraLAX.      Irish Elders, MD, MSc  [facility]  04/02/23

## 2023-04-02 NOTE — Progress Notes (Signed)
[facility] Daily Progress Note      Admit Date:  03/31/2023      Cardiology consult: Shortness of breath, chest pain, dizziness    Subjective:  Rachael Mays .  Notes nausea and shortness of breath.      History of present illness:   Presents with a history of coronary artery disease s/p DES of LAD (03/2016), hypertension, hyperlipidemia, diastolic heart failure, and carotid artery stenosis.  She has chronic chest pain and shortness of breath that did not resolve after coronary intervention in 03/2016.  RHC (2017) . Additional history includes DVT/PE,  s/p lap band surgery in 2014. S/p dilation of esophagus 04/2021     02/18/21 LHC showed patent proximal LAD stent with mid 30% stenosis in some views and FFR 0.89.  Medical management was recommended.  LHC 11/22 mild nonobstructive cad with mild myocardial bridging. 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         Patient presents to the hospital with couple day history of shortness of breath associated midsternal chest pressure patient woke up this night with nausea and vomiting and midsternal chest pressure no fevers chills no cough no sick contacts no recent travel patient is legs are slightly swollen.  Patient does not smoke or drink but does have a history of blood clots in the past      Cardiology has been consulted for further evaluation.      She reports dizziness on Thursday, felt like vertigo, room spinning. Then Sunday her shortness of breath worsened and has not improving. She feels that it is similar to when she had stents. She is unable to get a deep breath. She endorses nausea off and on since Thursday. She is tearful during visit. Chest tenderness on palpation. She does feel some palpitations.     Objective:   BP 130/73   Pulse 60   Temp 97.8 F (36.6 C) (Oral)   Resp 16   Wt (!) 138.7 kg (305 lb 11.2 oz)   SpO2 96%   BMI 50.87 kg/m     Intake/Output Summary (Last 24 hours) at 04/02/2023 1016  Last data filed at 04/02/2023  0440  Gross per 24 hour   Intake 240 ml   Output 600 ml   Net -360 ml       Physical Exam:  General:  Awake, alert, oriented x 3, NAD  Skin:  Warm and dry  Neck:  JVD is difficult to assess  Chest:  decreased air exchange bilaterally  Cardiovascular:  RRR S1S2, no S3, no significant murmur  Abdomen:  Soft, ND, NT, No HSM  Extremities:  No edema    Medications:    aspirin  81 mg Oral Daily    Vitamin D  2,000 Units Oral Daily    oyster shell calcium w/D  2 tablet Oral BID    rosuvastatin  40 mg Oral Daily    magnesium oxide  400 mg Oral Daily    montelukast  10 mg Oral Nightly    ranolazine  500 mg Oral BID    isosorbide mononitrate  30 mg Oral Daily    gabapentin  300 mg Oral Nightly    [Held by provider] spironolactone  25 mg Oral Daily    dilTIAZem  180 mg Oral Daily    [Held by provider] torsemide  20 mg Oral Daily    pantoprazole  40 mg Oral QAM AC    sodium chloride flush  5-40 mL IntraVENous  2 times per day    enoxaparin  30 mg SubCUTAneous BID    insulin lispro  0-8 Units SubCUTAneous TID WC    insulin lispro  0-4 Units SubCUTAneous Nightly    lidocaine  1 patch Topical Daily      sodium chloride 50 mL/hr at 04/01/23 1833    sodium chloride      dextrose       oxyCODONE-acetaminophen, albuterol sulfate HFA, nitroGLYCERIN, sodium chloride flush, sodium chloride, ondansetron **OR** ondansetron, magnesium hydroxide, acetaminophen **OR** acetaminophen, potassium chloride **OR** potassium alternative oral replacement **OR** potassium chloride, magnesium sulfate, hydrALAZINE, sodium chloride, dextrose bolus **OR** dextrose bolus, dextrose    TELEMETRY (Personally reviewed by me): Sinus     Lab Data:  CBC:   Recent Labs     03/31/23  1601 04/01/23  0529   WBC 7.2 6.7   HGB 13.5 12.8   HCT 41.5 39.1   MCV 87.6 87.5   PLT 187 170     BMP:   Recent Labs     03/31/23  1601 04/01/23  0529 04/02/23  0537   NA 138 143 138   K 4.2 4.2 4.1   CL 101 105 99   CO2 25 26 30    BUN 21* 23* 26*   CREATININE 1.4* 1.3* 1.4*     LIVER  PROFILE:   Recent Labs     03/31/23  1601 04/01/23  0529   AST 21 19   ALT 16 14   BILITOT 0.5 0.5   ALKPHOS 100 94     PT/INR:   Recent Labs     03/31/23  1601   PROTIME 14.1   INR 1.06     APTT:   Recent Labs     03/31/23  1601   APTT 27.8     BNP:  No results for input(s): "BNP" in the last 72 hours.  Imaging/Procedures:       Echo 04/01/23   Summary   Technically difficult study due to body habitus. Definity was administered.   Left ventricular cavity size is normal with normal left ventricular wall   thickness. Ejection fraction is visually estimated to be 55-60%. Grade I   diastolic dysfunction with normal LV filling pressures. E/e' = 11.1.   Non-coronary cusp appears restricted.   Mild aortic stenosis with a peak velocity of 2.11m/s and a mean pressure   gradient of . AVA (VTI): 2.18 cm.2.   RVSP is estimated to be 21-24 mmHG.   IVC not well visualized.   Right atrium is not well visualized, but appears normal in size.   Right ventricle is not well visualized, but appears dilated.   There appears to be some calcification of the posterior mitral valve leaflet   without evidence of stenosis.    CTPA 03/31/2023  IMPRESSION:  No evidence of pulmonary embolism or acute pulmonary abnormality.     Cardiac cath 10/14/21 [facility] Bue)  Anatomy:   LM-normal  LAD-patent proximal stent with 10% in-stent restenosis, 30% mid with mild myocardial bridging  Cx-10% to 20% proximal  OM1-normal  RCA-normal  LVEF-70%     Impression:  1.  Mild nonobstructive CAD.  2.  Hyperdynamic LV systolic function.  3.  High resting heart rate.     Plan:  1.  Recommend addition of diltiazem CD 180 mg daily.  To accommodate addition of diltiazem reduce Ranexa to 500 mg twice daily.  Also would decrease lisinopril to 20 mg daily to allow for enough  blood pressure to accommodate addition of diltiazem.  Diltiazem may be helpful should there be a component of microvascular angina.     Cardiac cath 02/18/21 ([facility])   Angiographic Findings:  Right  dominant system   Left Main:  Normal     Left Anterior Descending:  Patent proximal stent. Mid 30% stenosis,   irregular appearing in some views. FFR 0.89.   Circumflex:  No obstructive disease   Right Coronary:  No obstructive disease   Left Ventriculogram:  LVEF 55-60%     Conclusions:   -No obstructive CAD   Recommendations:   -Follow-up with GI   -Continue medical therapy      Lexiscan myoview 01/29/21 ([facility])  Interpetation Summary:   1. No no change of baseline chest pain after regadenoson injection.   2. No evidence of stress-induced ischemia on EKG.   3. There is evidence of ischemia in the territory subtended by the left anterior descending coronary artery.   4. Normal LV systolic function.          Carotid duplex 11/12/20: [facility])      o 50-69% stenosis of the right internal carotid artery based on velocity criteria.      o No hemodynamically significant lesions are identified on the left internal or common carotid(s).      o There is antegrade flow demonstrated in the right vertebral artery.      o The left vertebral artery could not be visualized.      Echocardiogram 05/27/20: ([facility])     The left ventricle is normal in size.   There is normal left ventricular wall thickness.   Left ventricular systolic function is normal. (WRUE>/=45%)   Overall left ventricular ejection fraction is estimated to be 60-65%.   Normal Diastolic Function.   No significant valvular abnormalities identified.            Procedure:          LHC 08/09/19  Indication:          Unstable angina     Procedure Details:  Consent Access Bleed R Sedat Start Stop Versed Fentanyl Contrast Fluoro EBL Comp Spec   Yes RRA low MCSFC 1457 1522 4 150 51 1.9 <20 None None   *Ultrasound Note: Ultrasound guidance used to determine aforementioned artery patency, size (>80mm), anatomic variations and ideal puncture location.  Real-time ultrasound utilized concurrent with vascular needle entry into the artery.  Image(s) permanently  recorded and reported in the patient chart.  *Sedation Note: MCSFC = minimal conscious sedation for comfort     Findings:  Artery Findings/Result   LM Normal   LAD Widely patent stent, 50% ostial D1 jailed by stent (no change)   Cx Normal   RI NA   RCA Normal   LVEDP 6   LVG 60%      Intervention:         None     Post Cath Dx:       Nonobstructive CAD, patent stent LAD     Cardiac Cath 05/07/17:  Anatomy:   LM-normal  LAD-normal, widely patent stent mid  Cx-normal  OM1- normal  RCA-normal, right dominant  RPDA- normal  LVEF- 60%     Impression:  1.  Normal coronary artery anatomy.  2.  Widely patent mid LAD stent.  3.  Normal LV systolic function.  4.  False positive stress test.  Likely breast artifact.     Plan:  1.  Medical management.  2.  She needs workup of noncardiac causes of chest pain.  She now has to stress test with similar abnormalities most consistent with breast artifact.  The majority of her coronary angiograms have demonstrated normal anatomy except for the one that had a 70% mid LAD stenosis which was stented.  Her chest pain has atypical features.  Recommend rheumatology and possibly orthopedic evaluation.     Echo 08/05/16 ([address] [place], [place])  LV with normal systolic function,  EF 55-60%  Mild MR with multiple jets  Very mild AS  Mild TR     Cardiac Cath 07/17/16:  Anatomy:   LM-normal  LAD-widely patent stent otherwise normal  Cx-normal, dominant  OM1- normal  RCA-normal  LVEF- 70%, LVEDP 15 mmHg     Impression:  1.  Widely patent LAD stent otherwise normal coronary arteries  2.  Hyperdynamic LV systolic function  3.  Non-cardiac chest pain     Plan:  1.  Medical management     Cardiac Cath 03/31/16:  Anatomy:   LM-normal   LAD-70% mid with FFR 0.77  Cx-normal, codominant  OM1- normal  RCA-normal  RPDA- normal  LVEF- 70%, LVEDP 19   PCI: LAD 70% to 0% with 3.25 mm x 18 mm Xience Alpine drug-eluting stent     Impression:  1.  Severe 1 vessel CAD involving the LAD with successful drug-eluting stent  implantation.  2.  Hyperdynamic LV systolic function.  3.  Mildly decompensated diastolic CHF.  4.  Moderate pulmonary hypertension.     Cardiac cath 09/23/15: normal coronary arteries         Assessment/Plan:  Principal Problem:    Chronic diastolic heart failure (HCC)  Plan: proBNP normal but can be misleading and obesity.    Active Problems:    Coronary artery disease due to lipid rich plaque  Plan: History of PCI with stent placement to the LAD.  Widely patent by cardiac cath in 2022.  Planning left heart catheterization due to multiple complaints including shortness of breath and chest discomfort.  If no significant plaque identified at cardiac cath, consider stress PET at [facility] or UC as outpatient.  Cardiac cath, now delayed to Monday, given renal insufficiency and recent contrast load with CTPA.      Elevated troponin  Plan: Likely multifactorial.      AKI (acute kidney injury) (HCC)  Plan: Has follow-up with nephrology on outpatient basis would recommend nephrology consultation for renal optimization and cardiac catheterization.  She states she was recently started on torsemide about 3 weeks ago.      CKD (chronic kidney disease)  Plan: Is following with nephrology.  Hold diuretics.  Hydration started.  Nephrology consulted.      Morbid obesity with BMI of 50.0-59.9, adult (HCC)  Plan: Weight loss encouraged.      Dizziness  Plan: Etiology unclear.  Difficult history.      Essential hypertension  Plan: Stable.  Continue current medical regimen.      Dyslipidemia  Plan: Stable.  Continue current medical regimen.      Type 2 diabetes mellitus with diabetic polyneuropathy, without long-term current use of insulin (HCC)  Plan: Per primary service.      Plan: Cardiac catheterization on Monday stable.  Gentle hydration.  Nephrology consultation for renal optimization.  Consider GI evaluation for persistent nausea.        Court Joy, MD 04/02/2023 10:16 AM

## 2023-04-02 NOTE — Progress Notes (Signed)
[facility] PROGRESS NOTE    04/02/2023 11:43 AM        Name: Rachael Mays .              Admitted: 03/31/2023  Primary Care Provider: Maximino Sarin, DO (Tel: [phone])        Subjective:      Best chills having short of breath with exertion Home O2 eval done yesterday no hypoxia patient complained of nausea now and lightheadedness when she stands up    Reviewed interval ancillary notes    Current Medications  oxyCODONE-acetaminophen (PERCOCET) 7.5-325 MG per tablet 1 tablet, Q6H PRN  0.9 % sodium chloride infusion, Continuous  aspirin EC tablet 81 mg, Daily  albuterol sulfate HFA (PROVENTIL;VENTOLIN;PROAIR) 108 (90 Base) MCG/ACT inhaler 2 puff, Q6H PRN  Vitamin D (CHOLECALCIFEROL) tablet 2,000 Units, Daily  oyster shell calcium w/D 500-5 MG-MCG tablet 2 tablet, BID  rosuvastatin (CRESTOR) tablet 40 mg, Daily  magnesium oxide (MAG-OX) tablet 400 mg, Daily  montelukast (SINGULAIR) tablet 10 mg, Nightly  ranolazine (RANEXA) extended release tablet 500 mg, BID  isosorbide mononitrate (IMDUR) extended release tablet 30 mg, Daily  nitroGLYCERIN (NITROSTAT) SL tablet 0.4 mg, Q5 Min PRN  gabapentin (NEURONTIN) capsule 300 mg, Nightly  [Held by provider] spironolactone (ALDACTONE) tablet 25 mg, Daily  dilTIAZem (CARDIZEM CD) extended release capsule 180 mg, Daily  [Held by provider] torsemide (DEMADEX) tablet 20 mg, Daily  pantoprazole (PROTONIX) tablet 40 mg, QAM AC  sodium chloride flush 0.9 % injection 5-40 mL, 2 times per day  sodium chloride flush 0.9 % injection 10 mL, PRN  0.9 % sodium chloride infusion, PRN  ondansetron (ZOFRAN-ODT) disintegrating tablet 4 mg, Q8H PRN   Or  ondansetron (ZOFRAN) injection 4 mg, Q6H PRN  magnesium hydroxide (MILK OF MAGNESIA) 400 MG/5ML suspension 30 mL, Daily PRN  acetaminophen (TYLENOL) tablet 650 mg, Q6H PRN   Or  acetaminophen (TYLENOL) suppository 650 mg, Q6H PRN  enoxaparin Sodium  (LOVENOX) injection 30 mg, BID  potassium chloride (KLOR-CON M) extended release tablet 40 mEq, PRN   Or  potassium bicarb-citric acid (EFFER-K) effervescent tablet 40 mEq, PRN   Or  potassium chloride 10 mEq/100 mL IVPB (Peripheral Line), PRN  magnesium sulfate 2000 mg in 50 mL IVPB premix, PRN  hydrALAZINE (APRESOLINE) injection 10 mg, Q6H PRN  sodium chloride 0.9 % bolus 500 mL, PRN  insulin lispro (HUMALOG) injection vial 0-8 Units, TID WC  insulin lispro (HUMALOG) injection vial 0-4 Units, Nightly  dextrose bolus 10% 125 mL, PRN   Or  dextrose bolus 10% 250 mL, PRN  dextrose 10 % infusion, Continuous PRN  lidocaine 4 % external patch 1 patch, Daily        Objective:  BP 130/73   Pulse 60   Temp 97.8 F (36.6 C) (Oral)   Resp 16   Wt (!) 138.7 kg (305 lb 11.2 oz)   SpO2 96%   BMI 50.87 kg/m     Intake/Output Summary (Last 24 hours) at 04/02/2023 1143  Last data filed at 04/02/2023 0440  Gross per 24 hour   Intake 240 ml   Output 600 ml   Net -360 ml      Wt Readings from Last 3 Encounters:   04/02/23 (!) 138.7 kg (305 lb 11.2 oz)   02/08/23 136.1 kg (300 lb)   09/29/22 (!) 138.3 kg (305 lb)       General appearance:  Appears comfortable. AAOx3  HEENT: atraumatic, Pupils equal, muscous membranes  moist, no masses appreciated  Cardiovascular: Regular rate and rhythm no murmurs appreciated  Respiratory: CTAB no wheezing  Gastrointestinal: Abdomen soft, non-tender, BS+  EXT: trace edema  Neurology: no gross focal deficts  Psychiatry: Appropriate affect. Not agitated  Skin: Warm, dry, no rashes appreciated       Labs and Tests:  CBC:   Recent Labs     03/31/23  1601 04/01/23  0529   WBC 7.2 6.7   HGB 13.5 12.8   PLT 187 170       BMP:    Recent Labs     03/31/23  1601 04/01/23  0529 04/02/23  0537   NA 138 143 138   K 4.2 4.2 4.1   CL 101 105 99   CO2 25 26 30    BUN 21* 23* 26*   CREATININE 1.4* 1.3* 1.4*   GLUCOSE 114* 118* 119*       Hepatic:   Recent Labs     03/31/23  1601 04/01/23  0529   AST 21 19   ALT 16  14   BILITOT 0.5 0.5   ALKPHOS 100 94       XR CHEST (2 VW)   Final Result   No acute process.         CT CHEST PULMONARY EMBOLISM W CONTRAST   Final Result   No evidence of pulmonary embolism or acute pulmonary abnormality.         XR CHEST PORTABLE   Final Result   No acute process.             Recent imaging reviewed    Problem List  Principal Problem:    Chronic diastolic heart failure (HCC)  Active Problems:    Coronary artery disease due to lipid rich plaque    Dyslipidemia    Type 2 diabetes mellitus with diabetic polyneuropathy, without long-term current use of insulin (HCC)    AKI (acute kidney injury) (HCC)    Morbid obesity with BMI of 50.0-59.9, adult (HCC)    Dizziness    Essential hypertension    Chest pain with high risk for cardiac etiology    CKD (chronic kidney disease)    Elevated troponin  Resolved Problems:    * No resolved hospital problems. *       Assessment/Plan:   Chest pain  discussed with cards will get GI input for cath given recent contrast   check orthostatics    Ckd3 monitor, iv fluids for nowhold lasix    Nausea get gi input fu with Dr. Ned Clines ssi     DVT prophylaxis lovenox  Code status full code      Mikal Plane, MD   04/02/2023 11:43 AM

## 2023-04-02 NOTE — Consults (Signed)
[facility]   Nephrology progress Note  [id number]  [phone]   [email or web address]         Reason for Consult:  risk stratification for CCAth    HISTORY OF PRESENT ILLNESS:      The patient is a 67 y.o. female with significant past medical history of CKD stage IIIb, baseline creatinine 1.3-1.5 mg/dL follows with Dr. Orlev-Shitrit/Dr. Melvyn Neth, hypertension, chronic diastolic heart failure with EF 55-60%, who presents with chest pain. Renal indices at baseline. Cardiology is consulted and Ccath is planned. Patient did get contrast study on 03/31/23. CT PE, negative for PE      Past Medical History:        Diagnosis Date    AKI (acute kidney injury) (HCC) 01/26/2022    Anxiety     Asthma     Blood circulation, collateral     CAD (coronary artery disease)     CHF (congestive heart failure) (HCC)     Chronic back pain     Deep vein thrombosis     Degeneration of thoracic intervertebral disc 10/15/2017    Depression     GERD (gastroesophageal reflux disease)     NO LONGER SINCE LAP    GERD (gastroesophageal reflux disease) 01/23/2009    Hx of blood clots     Hyperlipidemia     hx; resolved with lap band    Hypertension     Idiopathic peripheral neuropathy 03/26/2017    Melanoma (HCC)     upper back    MRSA (methicillin resistant staph aureus) culture positive 10/17/2019    leg abscess    Obesity     hx of; had lap band    Obstructive sleep apnea 09/18/2015    uses bi-pap    Obstructive sleep apnea (adult) (pediatric) 09/18/2015    Pulmonary embolism (HCC)     Stenosis of right carotid artery 06/16/2021    Type 2 diabetes mellitus with diabetic polyneuropathy, without long-term current use of insulin (HCC) 01/26/2022       Past Surgical History:        Procedure Laterality Date    BACK SURGERY      neck  plates    BREAST SURGERY      left lumpectomy    CERVICAL FUSION      CESAREAN SECTION      CHOLECYSTECTOMY      COLONOSCOPY      COLONOSCOPY  04/08/2010    COLONOSCOPY N/A 04/18/2021    COLONOSCOPY  DIAGNOSTIC performed by Kandice Robinsons, MD at [facility] ENDOSCOPY    CORONARY ANGIOPLASTY  03/31/2016    mid LAD 3.25x18 alpine    DIAGNOSTIC CARDIAC CATH LAB PROCEDURE  08/09/2019    normal    DILATATION, ESOPHAGUS      ENDOSCOPY, COLON, DIAGNOSTIC      HYSTERECTOMY (CERVIX STATUS UNKNOWN)      HYSTERECTOMY, VAGINAL      LAP BAND  05/01/2008    Dr. Suzi Roots    OTHER SURGICAL HISTORY      [place] Filter: curently in place    OTHER SURGICAL HISTORY  07/05/2013    lap band removal    SHOULDER SURGERY      SKIN CANCER EXCISION  12/29/2017    UPPER GASTROINTESTINAL ENDOSCOPY  05/19/2013    UPPER GASTROINTESTINAL ENDOSCOPY  07/21/2013    ESOPHAGEAL STENT PLACEMENT    UPPER GASTROINTESTINAL ENDOSCOPY N/A 07/31/2014    Esophagogastroduodenoscopy with esophageal balloon dilation  UPPER GASTROINTESTINAL ENDOSCOPY N/A 04/18/2021    EGD DILATION BALLOON performed by Kandice Robinsons, MD at [facility] ENDOSCOPY    VASCULAR SURGERY  04/2015    Donzetta Sprung, celiac artery angiogram, normal abd arteries       Current Medications:    No current facility-administered medications on file prior to encounter.     Current Outpatient Medications on File Prior to Encounter   Medication Sig Dispense Refill    torsemide (DEMADEX) 20 MG tablet Take 1 tablet by mouth daily 90 tablet 0    dilTIAZem (CARDIZEM CD) 180 MG extended release capsule TAKE ONE CAPSULE BY MOUTH DAILY 90 capsule 3    spironolactone (ALDACTONE) 25 MG tablet TAKE 1 TABLET BY MOUTH DAILY 30 tablet 3    fluocinonide (LIDEX) 0.05 % external solution APPLY SPARINGLY TO THE BACK OF THE SCALP DAILY AS NEEDED FOR ITCHING 60 mL 1    ketoconazole (NIZORAL) 2 % cream Apply to the rash on the upper back twice daily for 2 weeks. (Patient not taking: Reported on 03/31/2023) 30 g 2    gabapentin (NEURONTIN) 100 MG capsule Take 1 capsule by mouth nightly.      metFORMIN (GLUCOPHAGE) 500 MG tablet Take 1 tablet by mouth 2 times daily (with meals) (Patient not taking: Reported on 03/31/2023)       isosorbide mononitrate (IMDUR) 30 MG extended release tablet Take 1 tablet by mouth daily 90 tablet 3    nitroGLYCERIN (NITROSTAT) 0.4 MG SL tablet up to max of 3 total doses. If no relief after 1 dose, call 911. 25 tablet 3    ranolazine (RANEXA) 500 MG extended release tablet TAKE ONE TABLET BY MOUTH TWICE A DAY 180 tablet 3    clobetasol (TEMOVATE) 0.05 % cream Apply to affected area twice daily for up to 2 weeks or until improved. 60 g 1    omeprazole (PRILOSEC) 40 MG delayed release capsule TAKE ONE CAPSULE BY MOUTH DAILY 30 capsule 0    triamcinolone (KENALOG) 0.1 % cream Apply to affected area twice daily for up to 2 weeks or until improved. 15 g 2    topiramate (TOPAMAX) 25 MG tablet Take 2 tablets by mouth 2 times daily (Patient not taking: Reported on 03/31/2023)      montelukast (SINGULAIR) 10 MG tablet TAKE ONE TABLET BY MOUTH DAILY. **MUST CALL MD FOR APPOINTMENT 30 tablet 5    oxyCODONE-acetaminophen (PERCOCET) 7.5-325 MG per tablet as needed.      rosuvastatin (CRESTOR) 20 MG tablet daily      magnesium (MAGNESIUM-OXIDE) 250 MG TABS tablet Take 350 mg by mouth daily      Coenzyme Q10 (CO Q10) 200 MG CAPS Take by mouth daily (with breakfast)      calcium citrate-vitamin D (CITRICAL + D) 315-250 MG-UNIT TABS per tablet Take 2 tablets by mouth 2 times daily      diclofenac sodium (VOLTAREN) 1 % GEL APPLY 4 GRAMS TO AFFECTED AREA(S) TWO TIMES A DAY (Patient not taking: Reported on 03/31/2023) 100 g 1    Cholecalciferol (VITAMIN D3) 50 MCG (2000 UT) CAPS Take 1 capsule by mouth daily 90 capsule 3    albuterol sulfate HFA (PROVENTIL HFA) 108 (90 Base) MCG/ACT inhaler Inhale 2 puffs into the lungs every 6 hours as needed for Wheezing 3 Inhaler 3    lidocaine (LIDODERM) 5 % Place 1 patch onto the skin daily 12 hours on, 12 hours off. (Patient taking differently: Place 1 patch onto the skin  as needed 12 hours on, 12 hours off.) 30 patch 0    aspirin 81 MG EC tablet Take 1 tablet by mouth daily 30 tablet 11     Multiple Vitamins-Minerals (MULTIVITAMIN PO) Take 1 tablet by mouth daily          Allergies:  Ibuprofen, Lisinopril, Seasonal, and Talwin [pentazocine]    Social History:    Social History     Socioeconomic History    Marital status: Divorced     Spouse name: [name]    Number of children: 2    Years of education: 12    Highest education level: Not on file   Occupational History    Occupation: Retired   Tobacco Use    Smoking status: Former     Current packs/day: 0.00     Average packs/day: 0.5 packs/day for 40.0 years (20.0 ttl pk-yrs)     Types: Cigarettes     Start date: 08/07/1972     Quit date: 08/07/2012     Years since quitting: 10.6    Smokeless tobacco: Never   Vaping Use    Vaping Use: Never used   Substance and Sexual Activity    Alcohol use: No     Alcohol/week: 0.0 standard drinks of alcohol    Drug use: No    Sexual activity: Not Currently     Partners: Male   Other Topics Concern    Not on file   Social History Narrative    Works as Conservation officer, nature at [employer]: Low Risk  (05/15/2020)    Overall Financial Resource Strain (CARDIA)     Difficulty of Paying Living Expenses: Not hard at all   Food Insecurity: No Food Insecurity (03/31/2023)    Hunger Vital Sign     Worried About Running Out of Food in the Last Year: Never true     Ran Out of Food in the Last Year: Never true   Transportation Needs: No Transportation Needs (03/31/2023)    PRAPARE - Therapist, art (Medical): No     Lack of Transportation (Non-Medical): No   Physical Activity: Not on file   Stress: Not on file   Social Connections: Not on file   Intimate Partner Violence: Not on file   Housing Stability: Low Risk  (03/31/2023)    Housing Stability Vital Sign     Unable to Pay for Housing in the Last Year: No     Number of Places Lived in the Last Year: 1     Unstable Housing in the Last Year: No       Family History:       Problem Relation Age of Onset    Arthritis  Mother     Cancer Mother     Depression Mother     High Blood Pressure Mother     Heart Disease Mother 30        MI     Ovarian Cancer Mother 16    Cancer Father 27        colon    Other Sister         OSA    Cancer Sister         lung liver    Other Brother         OSA    Sleep Apnea Brother     Other  Brother         OSA    Sleep Apnea Brother     Heart Disease Maternal Grandmother     Early Death Paternal Grandmother     Heart Disease Paternal Grandfather     Other Daughter         OSA    Diabetes Other     High Blood Pressure Other     Obesity Other        REVIEW OF SYSTEMS:    10 pt ROS done, relevant features as in HOPI, rest negative       PHYSICAL EXAM:    Vitals:    BP 130/73   Pulse 60   Temp 97.8 F (36.6 C) (Oral)   Resp 16   Wt (!) 138.7 kg (305 lb 11.2 oz)   SpO2 96%   BMI 50.87 kg/m     Intake/Output Summary (Last 24 hours) at 04/02/2023 1136  Last data filed at 04/02/2023 0440  Gross per 24 hour   Intake 240 ml   Output 600 ml   Net -360 ml       Physical Exam:  CONSTITUTIONAL/PSYCHIATRY: awake, alert and oriented x3. Not in acute distress, morbidly obese  EYES: Conjunctivae: normal. Pupils: reactive to light  RESPIRATORY: Respiratory effort: normal. Auscultation: normal/clear  CARDIOVASCULAR: Auscultation: regular. No JVD, Edema: none.   GASTROINTESTINAL: Soft, nontender, nondistended. Liver: normal in size  EXTREMITIES:  No cyanosis or clubbing.  SKIN: Warm and dry. No significant rashes    DATA:    RFP:   Recent Labs     03/31/23  1601 04/01/23  0529 04/02/23  0537   NA 138 143 138   K 4.2 4.2 4.1   CL 101 105 99   CO2 25 26 30    BUN 21* 23* 26*   CREATININE 1.4* 1.3* 1.4*   CALCIUM 10.0 9.8 9.7   MG  --  2.10 2.00       Liver panel:  Recent Labs     03/31/23  1601 04/01/23  0529   AST 21 19   ALT 16 14       Endocrine:   @BRIEFLAB72 (VitD,PTH,TSH,aldosterone,renin activity,cortisol,metanephrine)@    CBC:   Recent Labs     03/31/23  1601 04/01/23  0529   WBC 7.2 6.7   HGB 13.5 12.8   HCT 41.5  39.1   MCV 87.6 87.5   PLT 187 170       Iron Panel:   @BRIEFLAB24 (FERA,FE)@    Serology: @BRIEFLAB24 (ANAS,ANCA,C3,C4,RNP,AGBM,DNA,SSA,SSB,SPEP,UPEP,ESR,HEPT)@    Urine studies: @BRIEFLAB24 (UAPR,UCOL,UNAR,UUNR,UCRR,UCLR,UKR,UUAR,UOSM,EOS)@      IMPRESSION/RECOMMENDATIONS:      CKD stage IIIb - baseline creatinine 1.3-1.5 mg/dL. Follows with Dr. Olen Pel. 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 No proteinuria per outpatient notes. FLC/SPEP normal.Torsemide was increased to 20 mg due to edema.   - at this time, patient's renal indices are at baseline. She did receive IV contrast on 03/31/23 and therefore ccath is postponed to next week.   - I agree with gentle resuscitation and close monitoring through the weekend. Agree with holding diuretics.   - daily labs.     Chest pain - cardiology following. Ccath planned. Diuretics on hold    Hypertension - controlled. Continue current    Chronic diastolic heart failure - appears compensated.     Thank you for allowing me to participate in the care of this patient. Please contact via PerfectServe if any questions or concerns.       Prentice Docker, DO  04/02/2023

## 2023-04-02 NOTE — Progress Notes (Signed)
CHF Care Plan      Patient's EF (Ejection Fraction) is greater than 40%    Heart Failure Medications:  Diuretics:: Furosemide, Torsemide, and Spironolactone    (One of the following REQUIRED for EF </= 40%/SYSTOLIC FAILURE but MAY be used in EF% >40%/DIASTOLIC FAILURE)        ACE:: None        ARB:: None         ARNI:: None    (Beta Blockers)  NON- Evidenced Based Beta Blocker (for EF% >40%/DIASTOLIC FAILURE): Other    Evidenced Based Beta Blocker::(REQUIRED for EF% <40%/SYSTOLIC FAILURE) None  ...................................................................................................................................................    Failed to redirect to the Timeline version of the REVFS SmartLink.      Patient's weights and intake/output reviewed    Daily Weight log at bedside, patient/family participation in use of log: "yes    Patient's current weight today shows a difference of 1 lbs more than last documented weight.      Intake/Output Summary (Last 24 hours) at 04/02/2023 0148  Last data filed at 04/01/2023 2300  Gross per 24 hour   Intake 240 ml   Output 600 ml   Net -360 ml       Education Booklet Provided: no    Comorbidities Reviewed Yes    Patient has a past medical history of AKI (acute kidney injury) (HCC), Anxiety, Asthma, Blood circulation, collateral, CAD (coronary artery disease), CHF (congestive heart failure) (HCC), Chronic back pain, Deep vein thrombosis, Degeneration of thoracic intervertebral disc, Depression, GERD (gastroesophageal reflux disease), GERD (gastroesophageal reflux disease), Hx of blood clots, Hyperlipidemia, Hypertension, Idiopathic peripheral neuropathy, Melanoma (HCC), MRSA (methicillin resistant staph aureus) culture positive, Obesity, Obstructive sleep apnea, Obstructive sleep apnea (adult) (pediatric), Pulmonary embolism (HCC), Stenosis of right carotid artery, and Type 2 diabetes mellitus with diabetic polyneuropathy, without long-term current use of insulin (HCC).      >>For CHF and Comorbidity documentation on Education Time and Topics, please see Education Tab      Pt up in chair at this time on room air. Pt with complaints of shortness of breath. Pt without lower extremity edema.     Patient and/or Family's stated Goal of Care this Admission: reduce shortness of breath, increase activity tolerance, better understand heart failure and disease management, be more comfortable, and reduce lower extremity edema prior to discharge        :

## 2023-04-02 NOTE — Discharge Instructions (Signed)
Guidelines for Heart Failure home management:    1. Continue to monitor weight first thing each morning. You should weigh yourself after using the bathroom and before you eat breakfast.    2. Report to your doctor any significant weight change. Remember that weight change of 2-3 lbs. in 1 day or 5 lbs in a week is "significant" and likely represents changes in "fluid" status.  If you are experiencing any swelling in your feet, ankles or abdomen, or shortness of breath, call your doctor.     3. You should restrict all sodium intake to 3000 milligrams (3 grams) a day. Depending on your status, you may also be asked to restrict fluid intake to no more that 64 oz/2 Liters a day. If uncertain, ask the nurse or physician.     4. Regular aerobic exercise is encouraged 30 minutes a day (walking, bike, swimming, etc.). For specific exercise recommendations, ask your physician.     5. Report to your doctor any change in symptoms (chest pain, worsening shortness of breath, increased dizziness or passing out, increased palpitations or ICD shock, trouble catching breath while lying down, increased edema or abdominal bloating). Remember that even "minor" changes in symptoms may be important. Also report any changes in medications including "over the counter" medications.     6. DO NOT take NSAID's for pain (i.e, Advil, Aleve, Motrin, ibuprofen, and many more) since these may cause serious problems in those with a history of CHF. If uncertain about the medication, call your doctor.    7. If you have new significant or ongoing diarrhea or vomiting, please call your doctor for further instructions. Taking a diuretic (water pill) with these symptoms can worsen dehydration.     8. If you have any questions or concerns you can always call the CHF  Resource Line( [phone].  It is available Monday thru Friday 8 am- 5 pm. Please leave a message and your call will be returned shortly.     Extra Heart Failure  sites:    https://mydigitalpublication.com/publication/?i=753422  --- this is American Heart Association interactive Healthier Living with Heart Failure guidebook. Please click hyperlink or copy / paste link into search bar. Use your mouse to scroll through the pages. Lots of information about weight monitoring, diet tips, activity, meds, etc    HF Helper app -- this is a free smart phone app available for iPhone and Android download. Use your phone to track sodium / fluid intake, zone tool symptom tracking, weights, medications, etc. Click on this hyperlink HF Helper App for QR code for easy download.    DASH (Dietary Approach to Stop Hypertension) diet -- https://www.nhlbi.nih.gov/education/dash-eating-plan -- this diet is a flexible eating plan that promotes heart healthy eating style. Click on hyperlink or copy / paste link into search bar. Lots of low sodium recipes and tips.    https://www.eatingwell.com/recipes -- more free recipes

## 2023-04-02 NOTE — Plan of Care (Signed)
Problem: Safety - Adult  Goal: Free from fall injury  Outcome: Progressing     Problem: Chronic Conditions and Co-morbidities  Goal: Patient's chronic conditions and co-morbidity symptoms are monitored and maintained or improved  Outcome: Progressing     Problem: Respiratory - Adult  Goal: Achieves optimal ventilation and oxygenation  Outcome: Progressing     Problem: Skin/Tissue Integrity - Adult  Goal: Skin integrity remains intact  Outcome: Progressing     Problem: Pain  Goal: Verbalizes/displays adequate comfort level or baseline comfort level  Outcome: Progressing

## 2023-04-03 LAB — BASIC METABOLIC PANEL
Anion Gap: 10 (ref 3–16)
BUN: 22 mg/dL — ABNORMAL HIGH (ref 7–20)
CO2: 27 mmol/L (ref 21–32)
Calcium: 9.5 mg/dL (ref 8.3–10.6)
Chloride: 103 mmol/L (ref 99–110)
Creatinine: 1.2 mg/dL (ref 0.6–1.2)
Est, Glom Filt Rate: 50 — AB (ref >60)
Glucose: 121 mg/dL — ABNORMAL HIGH (ref 70–99)
Potassium: 4.4 mmol/L (ref 3.5–5.1)
Sodium: 140 mmol/L (ref 136–145)

## 2023-04-03 LAB — POCT GLUCOSE
POC Glucose: 130 mg/dl — ABNORMAL HIGH (ref 70–99)
POC Glucose: 145 mg/dl — ABNORMAL HIGH (ref 70–99)
POC Glucose: 154 mg/dl — ABNORMAL HIGH (ref 70–99)
POC Glucose: 175 mg/dl — ABNORMAL HIGH (ref 70–99)

## 2023-04-03 LAB — MAGNESIUM: Magnesium: 1.9 mg/dL (ref 1.80–2.40)

## 2023-04-03 MED FILL — RANOLAZINE ER 500 MG PO TB12: 500 MG | ORAL | Qty: 1

## 2023-04-03 MED FILL — MONTELUKAST SODIUM 10 MG PO TABS: 10 MG | ORAL | Qty: 1

## 2023-04-03 MED FILL — OS-CAL CALCIUM + D3 500-5 MG-MCG PO TABS: 500-5 MG-MCG | ORAL | Qty: 2

## 2023-04-03 MED FILL — ASPIRIN LOW DOSE 81 MG PO TBEC: 81 MG | ORAL | Qty: 1

## 2023-04-03 MED FILL — LOVENOX 30 MG/0.3ML IJ SOSY: 30 MG/0.3ML | INTRAMUSCULAR | Qty: 0.3

## 2023-04-03 MED FILL — ISOSORBIDE MONONITRATE ER 30 MG PO TB24: 30 MG | ORAL | Qty: 1

## 2023-04-03 MED FILL — OXYCODONE-ACETAMINOPHEN 7.5-325 MG PO TABS: ORAL | Qty: 1

## 2023-04-03 MED FILL — ROSUVASTATIN CALCIUM 40 MG PO TABS: 40 MG | ORAL | Qty: 1

## 2023-04-03 MED FILL — HEALTHYLAX 17 G PO PACK: 17 g | ORAL | Qty: 1

## 2023-04-03 MED FILL — VITAMIN D3 25 MCG (1000 UT) PO TABS: 25 MCG (1000 UT) | ORAL | Qty: 2

## 2023-04-03 MED FILL — ASPERCREME LIDOCAINE 4 % EX PTCH: 4 % | CUTANEOUS | Qty: 1

## 2023-04-03 MED FILL — MAGNESIUM OXIDE -MG SUPPLEMENT 400 (240 MG) MG PO TABS: 400 (240 Mg) MG | ORAL | Qty: 1

## 2023-04-03 MED FILL — PANTOPRAZOLE SODIUM 40 MG PO TBEC: 40 MG | ORAL | Qty: 1

## 2023-04-03 MED FILL — GABAPENTIN 300 MG PO CAPS: 300 MG | ORAL | Qty: 1

## 2023-04-03 MED FILL — DILTIAZEM HCL ER COATED BEADS 180 MG PO CP24: 180 MG | ORAL | Qty: 1

## 2023-04-03 NOTE — Progress Notes (Signed)
[facility]   Nephrology progress Note  [phone]  [phone]   [email or web address]         Reason for Consult:  risk stratification for CCAth    HISTORY OF PRESENT ILLNESS:      The patient is a 67 y.o. female with significant past medical history of CKD stage IIIb, baseline creatinine 1.3-1.5 mg/dL follows with Dr. Orlev-Shitrit/Dr. Melvyn Neth, hypertension, chronic diastolic heart failure with EF 55-60%, who presents with chest pain. Renal indices at baseline. Cardiology is consulted and Ccath is planned. Patient did get contrast study on 03/31/23. CT PE, negative for PE      Subjective:    Doing ok  Feels like its hard to take a deep breath in     PHYSICAL EXAM:    Vitals:    BP 104/64   Pulse 53   Temp 98.2 F (36.8 C) (Oral)   Resp 18   Wt (!) 140.1 kg (308 lb 12.8 oz)   SpO2 100%   BMI 51.39 kg/m     Intake/Output Summary (Last 24 hours) at 04/03/2023 1633  Last data filed at 04/03/2023 1537  Gross per 24 hour   Intake 980.27 ml   Output 1350 ml   Net -369.73 ml         Physical Exam:  CONSTITUTIONAL/PSYCHIATRY: awake, alert and oriented x3. Not in acute distress, morbidly obese  EYES: Conjunctivae: normal. Pupils: reactive to light  RESPIRATORY: Respiratory effort: normal. Auscultation: clear but decreased air entry   CARDIOVASCULAR: Auscultation: regular. No JVD, Edema: none.   GASTROINTESTINAL: Soft, nontender, nondistended. Liver: normal in size  EXTREMITIES:  No cyanosis or clubbing.  SKIN: Warm and dry. No significant rashes    DATA:    RFP:   Recent Labs     04/01/23  0529 04/02/23  0537 04/03/23  0548   NA 143 138 140   K 4.2 4.1 4.4   CL 105 99 103   CO2 26 30 27    BUN 23* 26* 22*   CREATININE 1.3* 1.4* 1.2   CALCIUM 9.8 9.7 9.5   MG 2.10 2.00 1.90         Liver panel:  Recent Labs     04/01/23  0529   AST 19   ALT 14         Endocrine:   @BRIEFLAB72 (VitD,PTH,TSH,aldosterone,renin activity,cortisol,metanephrine)@    CBC:   Recent Labs     04/01/23  0529   WBC 6.7   HGB 12.8    HCT 39.1   MCV 87.5   PLT 170         Iron Panel:   @BRIEFLAB24 (FERA,FE)@    Serology: @BRIEFLAB24 (ANAS,ANCA,C3,C4,RNP,AGBM,DNA,SSA,SSB,SPEP,UPEP,ESR,HEPT)@    Urine studies: @BRIEFLAB24 (UAPR,UCOL,UNAR,UUNR,UCRR,UCLR,UKR,UUAR,UOSM,EOS)@      IMPRESSION/RECOMMENDATIONS:      CKD stage IIIb - baseline creatinine 1.3-1.5 mg/dL. Follows with Dr. Olen Pel. 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 No proteinuria per outpatient notes. FLC/SPEP normal.Torsemide was increased to 20 mg due to edema.   - at this time, patient's renal indices are at baseline. She did receive IV contrast on 03/31/23 and therefore ccath is postponed to next week.   -will stop IVF today to avoid overload  -plan to resume Sunday night     Chest pain - cardiology following. Ccath planned. Diuretics on hold    Hypertension - controlled. Continue current    Chronic diastolic heart failure - appears compensated.     Thank you for allowing me to participate in the care of this patient.  Please contact via PerfectServe if any questions or concerns.       Judson Roch, MD  04/03/2023

## 2023-04-03 NOTE — Progress Notes (Signed)
Assessment complete. VSS. Patient resting in bed. Respirations even and easy. Call light in reach. Fall precautions in place. No needs expressed at this time.

## 2023-04-03 NOTE — Progress Notes (Signed)
[facility] Daily Progress Note      Admit Date:  03/31/2023      Cardiology consult: Shortness of breath, chest pain, dizziness    Subjective:  Rachael Mays .  Notes nausea and shortness of breath.      History of present illness:   Presents with a history of coronary artery disease s/p DES of LAD (03/2016), hypertension, hyperlipidemia, diastolic heart failure, and carotid artery stenosis.  She has chronic chest pain and shortness of breath that did not resolve after coronary intervention in 03/2016.  RHC (2017) . Additional history includes DVT/PE,  s/p lap band surgery in 2014. S/p dilation of esophagus 04/2021     02/18/21 LHC showed patent proximal LAD stent with mid 30% stenosis in some views and FFR 0.89.  Medical management was recommended.  LHC 11/22 mild nonobstructive cad with mild myocardial bridging. 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         Patient presents to the hospital with couple day history of shortness of breath associated midsternal chest pressure patient woke up this night with nausea and vomiting and midsternal chest pressure no fevers chills no cough no sick contacts no recent travel patient is legs are slightly swollen.  Patient does not smoke or drink but does have a history of blood clots in the past      Cardiology has been consulted for further evaluation.      She reports dizziness on Thursday, felt like vertigo, room spinning. Then Sunday her shortness of breath worsened and has not improving. She feels that it is similar to when she had stents. She is unable to get a deep breath. She endorses nausea off and on since Thursday. She is tearful during visit. Chest tenderness on palpation. She does feel some palpitations.     Today denies exertional chest pain, SOB/DOE, PND, palpitations, light-headedness, or edema. She was sleeping upon my examination.    Objective:   BP 126/78   Pulse 54   Temp 97.7 F (36.5 C) (Axillary)   Resp 18   Wt (!) 140.1 kg (308 lb  12.8 oz)   SpO2 97% Comment: c/o SOB  BMI 51.39 kg/m     Intake/Output Summary (Last 24 hours) at 04/03/2023 0756  Last data filed at 04/03/2023 1610  Gross per 24 hour   Intake 1120.27 ml   Output 1850 ml   Net -729.73 ml       Physical Exam:  General:  Awake, alert, oriented x 3, NAD  Skin:  Warm and dry  Neck:  JVD is difficult to assess  Chest:  decreased air exchange bilaterally  Cardiovascular:  RRR S1S2, no S3, no significant murmur  Abdomen:  Soft, ND, NT, No HSM  Extremities:  No edema    Medications:    polyethylene glycol  17 g Oral Daily    aspirin  81 mg Oral Daily    Vitamin D  2,000 Units Oral Daily    oyster shell calcium w/D  2 tablet Oral BID    rosuvastatin  40 mg Oral Daily    magnesium oxide  400 mg Oral Daily    montelukast  10 mg Oral Nightly    ranolazine  500 mg Oral BID    isosorbide mononitrate  30 mg Oral Daily    gabapentin  300 mg Oral Nightly    [Held by provider] spironolactone  25 mg Oral Daily    dilTIAZem  180 mg Oral Daily    [  Held by provider] torsemide  20 mg Oral Daily    pantoprazole  40 mg Oral QAM AC    sodium chloride flush  5-40 mL IntraVENous 2 times per day    enoxaparin  30 mg SubCUTAneous BID    insulin lispro  0-8 Units SubCUTAneous TID WC    insulin lispro  0-4 Units SubCUTAneous Nightly    lidocaine  1 patch Topical Daily      sodium chloride 50 mL/hr at 04/01/23 1833    sodium chloride      dextrose       oxyCODONE-acetaminophen, albuterol sulfate HFA, nitroGLYCERIN, sodium chloride flush, sodium chloride, ondansetron **OR** ondansetron, magnesium hydroxide, acetaminophen **OR** acetaminophen, potassium chloride **OR** potassium alternative oral replacement **OR** potassium chloride, magnesium sulfate, hydrALAZINE, sodium chloride, dextrose bolus **OR** dextrose bolus, dextrose    TELEMETRY (Personally reviewed by me): Sinus     Lab Data:  CBC:   Recent Labs     03/31/23  1601 04/01/23  0529   WBC 7.2 6.7   HGB 13.5 12.8   HCT 41.5 39.1   MCV 87.6 87.5   PLT 187  170     BMP:   Recent Labs     04/01/23  0529 04/02/23  0537 04/03/23  0548   NA 143 138 140   K 4.2 4.1 4.4   CL 105 99 103   CO2 26 30 27    BUN 23* 26* 22*   CREATININE 1.3* 1.4* 1.2     LIVER PROFILE:   Recent Labs     03/31/23  1601 04/01/23  0529   AST 21 19   ALT 16 14   BILITOT 0.5 0.5   ALKPHOS 100 94     PT/INR:   Recent Labs     03/31/23  1601   PROTIME 14.1   INR 1.06     APTT:   Recent Labs     03/31/23  1601   APTT 27.8     BNP:  No results for input(s): "BNP" in the last 72 hours.  Imaging/Procedures:       Echo 04/01/23   Summary   Technically difficult study due to body habitus. Definity was administered.   Left ventricular cavity size is normal with normal left ventricular wall   thickness. Ejection fraction is visually estimated to be 55-60%. Grade I   diastolic dysfunction with normal LV filling pressures. E/e' = 11.1.   Non-coronary cusp appears restricted.   Mild aortic stenosis with a peak velocity of 2.59m/s and a mean pressure   gradient of . AVA (VTI): 2.18 cm.2.   RVSP is estimated to be 21-24 mmHG.   IVC not well visualized.   Right atrium is not well visualized, but appears normal in size.   Right ventricle is not well visualized, but appears dilated.   There appears to be some calcification of the posterior mitral valve leaflet   without evidence of stenosis.    CTPA 03/31/2023  IMPRESSION:  No evidence of pulmonary embolism or acute pulmonary abnormality.     Cardiac cath 10/14/21 Berna Bue)  Anatomy:   LM-normal  LAD-patent proximal stent with 10% in-stent restenosis, 30% mid with mild myocardial bridging  Cx-10% to 20% proximal  OM1-normal  RCA-normal  LVEF-70%     Impression:  1.  Mild nonobstructive CAD.  2.  Hyperdynamic LV systolic function.  3.  High resting heart rate.     Plan:  1.  Recommend addition of diltiazem CD  180 mg daily.  To accommodate addition of diltiazem reduce Ranexa to 500 mg twice daily.  Also would decrease lisinopril to 20 mg daily to allow for enough blood  pressure to accommodate addition of diltiazem.  Diltiazem may be helpful should there be a component of microvascular angina.     Cardiac cath 02/18/21 ([facility])   Angiographic Findings:  Right dominant system   Left Main:  Normal     Left Anterior Descending:  Patent proximal stent. Mid 30% stenosis,   irregular appearing in some views. FFR 0.89.   Circumflex:  No obstructive disease   Right Coronary:  No obstructive disease   Left Ventriculogram:  LVEF 55-60%     Conclusions:   -No obstructive CAD   Recommendations:   -Follow-up with GI   -Continue medical therapy      Lexiscan myoview 01/29/21 ([facility])  Interpetation Summary:   1. No no change of baseline chest pain after regadenoson injection.   2. No evidence of stress-induced ischemia on EKG.   3. There is evidence of ischemia in the territory subtended by the left anterior descending coronary artery.   4. Normal LV systolic function.          Carotid duplex 11/12/20: [facility] Colonel)      o 50-69% stenosis of the right internal carotid artery based on velocity criteria.      o No hemodynamically significant lesions are identified on the left internal or common carotid(s).      o There is antegrade flow demonstrated in the right vertebral artery.      o The left vertebral artery could not be visualized.      Echocardiogram 05/27/20: ([facility])     The left ventricle is normal in size.   There is normal left ventricular wall thickness.   Left ventricular systolic function is normal. (ZOXW>/=96%)   Overall left ventricular ejection fraction is estimated to be 60-65%.   Normal Diastolic Function.   No significant valvular abnormalities identified.            Procedure:          LHC 08/09/19  Indication:          Unstable angina     Procedure Details:  Consent Access Bleed R Sedat Start Stop Versed Fentanyl Contrast Fluoro EBL Comp Spec   Yes RRA low MCSFC 1457 1522 4 150 51 1.9 <20 None None   *Ultrasound Note: Ultrasound guidance used to determine  aforementioned artery patency, size (>33mm), anatomic variations and ideal puncture location.  Real-time ultrasound utilized concurrent with vascular needle entry into the artery.  Image(s) permanently recorded and reported in the patient chart.  *Sedation Note: MCSFC = minimal conscious sedation for comfort     Findings:  Artery Findings/Result   LM Normal   LAD Widely patent stent, 50% ostial D1 jailed by stent (no change)   Cx Normal   RI NA   RCA Normal   LVEDP 6   LVG 60%      Intervention:         None     Post Cath Dx:       Nonobstructive CAD, patent stent LAD     Cardiac Cath 05/07/17:  Anatomy:   LM-normal  LAD-normal, widely patent stent mid  Cx-normal  OM1- normal  RCA-normal, right dominant  RPDA- normal  LVEF- 60%     Impression:  1.  Normal coronary artery anatomy.  2.  Widely patent mid LAD stent.  3.  Normal LV systolic function.  4.  False positive stress test.  Likely breast artifact.     Plan:  1.  Medical management.  2.  She needs workup of noncardiac causes of chest pain.  She now has to stress test with similar abnormalities most consistent with breast artifact.  The majority of her coronary angiograms have demonstrated normal anatomy except for the one that had a 70% mid LAD stenosis which was stented.  Her chest pain has atypical features.  Recommend rheumatology and possibly orthopedic evaluation.     Echo 08/05/16 ([address] [place], [place])  LV with normal systolic function,  EF 55-60%  Mild MR with multiple jets  Very mild AS  Mild TR     Cardiac Cath 07/17/16:  Anatomy:   LM-normal  LAD-widely patent stent otherwise normal  Cx-normal, dominant  OM1- normal  RCA-normal  LVEF- 70%, LVEDP 15 mmHg     Impression:  1.  Widely patent LAD stent otherwise normal coronary arteries  2.  Hyperdynamic LV systolic function  3.  Non-cardiac chest pain     Plan:  1.  Medical management     Cardiac Cath 03/31/16:  Anatomy:   LM-normal   LAD-70% mid with FFR 0.77  Cx-normal, codominant  OM1-  normal  RCA-normal  RPDA- normal  LVEF- 70%, LVEDP 19   PCI: LAD 70% to 0% with 3.25 mm x 18 mm Xience Alpine drug-eluting stent     Impression:  1.  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  2.  Hyperdynamic LV systolic function.  3.  Mildly decompensated diastolic CHF.  4.  Moderate pulmonary hypertension.     Cardiac cath 09/23/15: normal coronary arteries         Assessment/Plan:  Principal Problem:    Chronic diastolic heart failure (HCC)  Plan: proBNP normal but can be misleading and obesity.    Active Problems:    Coronary artery disease due to lipid rich plaque  Plan: History of PCI with stent placement to the LAD.  Widely patent by cardiac cath in 2022.  Planning left heart catheterization due to multiple complaints including shortness of breath and chest discomfort.  If no significant plaque identified at cardiac cath, consider stress PET at [facility] or UC as outpatient.  Cardiac cath, now delayed to Monday, given renal insufficiency and recent contrast load with CTPA.      Elevated troponin  Plan: Likely multifactorial.      AKI (acute kidney injury) (HCC)  Plan: Has follow-up with nephrology on outpatient basis would recommend nephrology consultation for renal optimization and cardiac catheterization.  She states she was recently started on torsemide about 3 weeks ago.      CKD (chronic kidney disease)  Plan: Is following with nephrology.  Hold diuretics.  Hydration started.  Nephrology consulted.      Morbid obesity with BMI of 50.0-59.9, adult (HCC)  Plan: Weight loss encouraged.      Dizziness  Plan: Etiology unclear.  Difficult history.      Essential hypertension  Plan: Stable.  Continue current medical regimen.      Dyslipidemia  Plan: Stable.  Continue current medical regimen.      Type 2 diabetes mellitus with diabetic polyneuropathy, without long-term current use of insulin (HCC)  Plan: Per primary service.      Plan: Cardiac catheterization on Monday.  Renal function back to baseline. Appreciate nephro recs. Will see Monday.  Durenda Guthrie, MD 04/03/2023 7:56 AM

## 2023-04-03 NOTE — Progress Notes (Signed)
[facility] PROGRESS NOTE    04/03/2023 5:54 PM        Name: Rachael Mays .              Admitted: 03/31/2023  Primary Care Provider: Maximino Sarin, DO (Tel: [phone])        Subjective:        In bed no nausea vomiting some shortness of breath  Reviewed interval ancillary notes    Current Medications  polyethylene glycol (GLYCOLAX) packet 17 g, Daily  oxyCODONE-acetaminophen (PERCOCET) 7.5-325 MG per tablet 1 tablet, Q6H PRN  aspirin EC tablet 81 mg, Daily  albuterol sulfate HFA (PROVENTIL;VENTOLIN;PROAIR) 108 (90 Base) MCG/ACT inhaler 2 puff, Q6H PRN  Vitamin D (CHOLECALCIFEROL) tablet 2,000 Units, Daily  oyster shell calcium w/D 500-5 MG-MCG tablet 2 tablet, BID  rosuvastatin (CRESTOR) tablet 40 mg, Daily  magnesium oxide (MAG-OX) tablet 400 mg, Daily  montelukast (SINGULAIR) tablet 10 mg, Nightly  ranolazine (RANEXA) extended release tablet 500 mg, BID  isosorbide mononitrate (IMDUR) extended release tablet 30 mg, Daily  nitroGLYCERIN (NITROSTAT) SL tablet 0.4 mg, Q5 Min PRN  gabapentin (NEURONTIN) capsule 300 mg, Nightly  [Held by provider] spironolactone (ALDACTONE) tablet 25 mg, Daily  dilTIAZem (CARDIZEM CD) extended release capsule 180 mg, Daily  [Held by provider] torsemide (DEMADEX) tablet 20 mg, Daily  pantoprazole (PROTONIX) tablet 40 mg, QAM AC  sodium chloride flush 0.9 % injection 5-40 mL, 2 times per day  sodium chloride flush 0.9 % injection 10 mL, PRN  0.9 % sodium chloride infusion, PRN  ondansetron (ZOFRAN-ODT) disintegrating tablet 4 mg, Q8H PRN   Or  ondansetron (ZOFRAN) injection 4 mg, Q6H PRN  magnesium hydroxide (MILK OF MAGNESIA) 400 MG/5ML suspension 30 mL, Daily PRN  acetaminophen (TYLENOL) tablet 650 mg, Q6H PRN   Or  acetaminophen (TYLENOL) suppository 650 mg, Q6H PRN  enoxaparin Sodium (LOVENOX) injection 30 mg, BID  potassium chloride (KLOR-CON M) extended release tablet 40 mEq, PRN    Or  potassium bicarb-citric acid (EFFER-K) effervescent tablet 40 mEq, PRN   Or  potassium chloride 10 mEq/100 mL IVPB (Peripheral Line), PRN  magnesium sulfate 2000 mg in 50 mL IVPB premix, PRN  hydrALAZINE (APRESOLINE) injection 10 mg, Q6H PRN  sodium chloride 0.9 % bolus 500 mL, PRN  insulin lispro (HUMALOG) injection vial 0-8 Units, TID WC  insulin lispro (HUMALOG) injection vial 0-4 Units, Nightly  dextrose bolus 10% 125 mL, PRN   Or  dextrose bolus 10% 250 mL, PRN  dextrose 10 % infusion, Continuous PRN  lidocaine 4 % external patch 1 patch, Daily        Objective:  BP 104/64   Pulse 53   Temp 98.2 F (36.8 C) (Oral)   Resp 18   Wt (!) 140.1 kg (308 lb 12.8 oz)   SpO2 97%   BMI 51.39 kg/m     Intake/Output Summary (Last 24 hours) at 04/03/2023 1754  Last data filed at 04/03/2023 1537  Gross per 24 hour   Intake 980.27 ml   Output 1350 ml   Net -369.73 ml        Wt Readings from Last 3 Encounters:   04/03/23 (!) 140.1 kg (308 lb 12.8 oz)   02/08/23 136.1 kg (300 lb)   09/29/22 (!) 138.3 kg (305 lb)       General appearance:  Appears comfortable. AAOx3  HEENT: atraumatic, Pupils equal, muscous membranes moist, no masses appreciated  Cardiovascular: Regular rate and rhythm no murmurs appreciated  Respiratory: CTAB no wheezing  Gastrointestinal: Abdomen soft, non-tender, BS+  EXT: trace edema  Neurology: no gross focal deficts  Psychiatry: Appropriate affect. Not agitated  Skin: Warm, dry, no rashes appreciated       Labs and Tests:  CBC:   Recent Labs     04/01/23  0529   WBC 6.7   HGB 12.8   PLT 170       BMP:    Recent Labs     04/01/23  0529 04/02/23  0537 04/03/23  0548   NA 143 138 140   K 4.2 4.1 4.4   CL 105 99 103   CO2 26 30 27    BUN 23* 26* 22*   CREATININE 1.3* 1.4* 1.2   GLUCOSE 118* 119* 121*       Hepatic:   Recent Labs     04/01/23  0529   AST 19   ALT 14   BILITOT 0.5   ALKPHOS 94       XR CHEST (2 VW)   Final Result   No acute process.         CT CHEST PULMONARY EMBOLISM W CONTRAST   Final  Result   No evidence of pulmonary embolism or acute pulmonary abnormality.         XR CHEST PORTABLE   Final Result   No acute process.             Recent imaging reviewed    Problem List  Principal Problem:    Chronic diastolic heart failure (HCC)  Active Problems:    Coronary artery disease due to lipid rich plaque    Dyslipidemia    Type 2 diabetes mellitus with diabetic polyneuropathy, without long-term current use of insulin (HCC)    AKI (acute kidney injury) (HCC)    Morbid obesity with BMI of 50.0-59.9, adult (HCC)    Dizziness    Essential hypertension    Chest pain with high risk for cardiac etiology    CKD (chronic kidney disease)    Elevated troponin  Resolved Problems:    * No resolved hospital problems. *       Assessment/Plan:   Chest pain  Cath on moday    Ckd3 monitor, crimprvoed    Nausea s/p golitely improved     Dm2 ssi     DVT prophylaxis lovenox  Code status full code      Mikal Plane, MD   04/03/2023 5:54 PM

## 2023-04-03 NOTE — Progress Notes (Addendum)
Gastroenterology Progress Note    Rachael Mays is a 67 y.o. female patient.  1. Acute on chronic congestive heart failure, unspecified heart failure type (HCC)    2. Shortness of breath    3. Dizziness    4. Elevated troponin        Admission Date: 03/31/2023  Hospital Day: Hospital Day: 4  Attending: Mikal Plane, MD  Date of service: 04/03/23    SUBJECTIVE:    Took golytely overnight - had a lot of BM. Nausea and lower abdominal pain completely gone  She still has SOB    ROS:  Negative other than HPI    Physical    VITALS:  BP 135/64   Pulse 64   Temp 97.7 F (36.5 C) (Oral)   Resp 18   Wt (!) 140.1 kg (308 lb 12.8 oz)   SpO2 97%   BMI 51.39 kg/m   TEMPERATURE:  Current - Temp: 97.7 F (36.5 C); Max - Temp  Avg: 98 F (36.7 C)  Min: 97.7 F (36.5 C)  Max: 98.2 F (36.8 C)    NAD  RRR, Nl s1s2  Lungs CTA Bilaterally, normal effort  Abdomen soft, ND, NT, no HSM, Bowel sounds normal  AAOx3, No asterixis     Data      CBC:   Recent Labs     03/31/23  1601 04/01/23  0529   WBC 7.2 6.7   RBC 4.74 4.47   HGB 13.5 12.8   HCT 41.5 39.1   PLT 187 170   MCV 87.6 87.5   MCH 28.5 28.6   MCHC 32.5 32.6   RDW 15.2 14.9        BMP:  Recent Labs     04/01/23  0529 04/02/23  0537 04/03/23  0548   NA 143 138 140   K 4.2 4.1 4.4   CL 105 99 103   CO2 26 30 27    BUN 23* 26* 22*   CREATININE 1.3* 1.4* 1.2   CALCIUM 9.8 9.7 9.5   GLUCOSE 118* 119* 121*        Hepatic Function Panel:   Recent Labs     03/31/23  1601 04/01/23  0529   AST 21 19   ALT 16 14   BILITOT 0.5 0.5   ALKPHOS 100 94       No results for input(s): "LIPASE", "AMYLASE" in the last 72 hours.  Recent Labs     03/31/23  1601   PROTIME 14.1   INR 1.06     No results for input(s): "PTT" in the last 72 hours.  No results for input(s): "OCCULTBLD" in the last 72 hours.      Radiology Review:    XR CHEST (2 VW)   Final Result   No acute process.         CT CHEST PULMONARY EMBOLISM W CONTRAST   Final Result   No evidence of pulmonary embolism or acute  pulmonary abnormality.         XR CHEST PORTABLE   Final Result   No acute process.                    Assessment/Plan:      Mid to lower abdominal pain, nausea without vomiting, like from chronic constipation-she does have history of constipation but reports having regular bowel movements on MiraLAX. This resolved with bowel purging  - PPI  - Titrate miralax daily to BID   -  GI will sign off. Please call if you have questions     Chest pain/pressure-cardiology planning left heart cath on Monday if renal function improves     CKD -nephrology following.    Irish Elders, MD, MSc  [facility]  04/03/23

## 2023-04-03 NOTE — Plan of Care (Signed)
Problem: Safety - Adult  Goal: Free from fall injury  Outcome: Progressing     Problem: Chronic Conditions and Co-morbidities  Goal: Patient's chronic conditions and co-morbidity symptoms are monitored and maintained or improved  Outcome: Progressing     Problem: Respiratory - Adult  Goal: Achieves optimal ventilation and oxygenation  Outcome: Progressing     Problem: Skin/Tissue Integrity - Adult  Goal: Skin integrity remains intact  Outcome: Progressing     Problem: Pain  Goal: Verbalizes/displays adequate comfort level or baseline comfort level  Outcome: Progressing

## 2023-04-04 LAB — POCT GLUCOSE
POC Glucose: 133 mg/dl — ABNORMAL HIGH (ref 70–99)
POC Glucose: 132 mg/dl — ABNORMAL HIGH (ref 70–99)
POC Glucose: 133 mg/dl — ABNORMAL HIGH (ref 70–99)

## 2023-04-04 LAB — BASIC METABOLIC PANEL
Anion Gap: 9 (ref 3–16)
BUN: 19 mg/dL (ref 7–20)
CO2: 26 mmol/L (ref 21–32)
Calcium: 9.8 mg/dL (ref 8.3–10.6)
Chloride: 105 mmol/L (ref 99–110)
Creatinine: 1.2 mg/dL (ref 0.6–1.2)
Est, Glom Filt Rate: 50 — AB (ref >60)
Glucose: 148 mg/dL — ABNORMAL HIGH (ref 70–99)
Potassium: 4.1 mmol/L (ref 3.5–5.1)
Sodium: 140 mmol/L (ref 136–145)

## 2023-04-04 LAB — BRAIN NATRIURETIC PEPTIDE: Pro-BNP: 98 pg/mL (ref 0–124)

## 2023-04-04 LAB — MAGNESIUM: Magnesium: 1.9 mg/dL (ref 1.80–2.40)

## 2023-04-04 MED ORDER — SODIUM CHLORIDE 0.9 % IV SOLN
0.9 % | INTRAVENOUS | Status: AC
Start: 2023-04-04 — End: 2023-04-05
  Administered 2023-04-05: 02:00:00 via INTRAVENOUS

## 2023-04-04 MED FILL — OS-CAL CALCIUM + D3 500-5 MG-MCG PO TABS: 500-5 MG-MCG | ORAL | Qty: 2

## 2023-04-04 MED FILL — OXYCODONE-ACETAMINOPHEN 7.5-325 MG PO TABS: ORAL | Qty: 1

## 2023-04-04 MED FILL — MAGNESIUM OXIDE -MG SUPPLEMENT 400 (240 MG) MG PO TABS: 400 (240 Mg) MG | ORAL | Qty: 1

## 2023-04-04 MED FILL — ISOSORBIDE MONONITRATE ER 30 MG PO TB24: 30 MG | ORAL | Qty: 1

## 2023-04-04 MED FILL — GABAPENTIN 300 MG PO CAPS: 300 MG | ORAL | Qty: 1

## 2023-04-04 MED FILL — MONTELUKAST SODIUM 10 MG PO TABS: 10 MG | ORAL | Qty: 1

## 2023-04-04 MED FILL — PANTOPRAZOLE SODIUM 40 MG PO TBEC: 40 MG | ORAL | Qty: 1

## 2023-04-04 MED FILL — RANOLAZINE ER 500 MG PO TB12: 500 MG | ORAL | Qty: 1

## 2023-04-04 MED FILL — VITAMIN D3 25 MCG (1000 UT) PO TABS: 25 MCG (1000 UT) | ORAL | Qty: 2

## 2023-04-04 MED FILL — OS-CAL CALCIUM + D3 500-5 MG-MCG PO TABS: 500-5 MG-MCG | ORAL | Qty: 1

## 2023-04-04 MED FILL — ROSUVASTATIN CALCIUM 40 MG PO TABS: 40 MG | ORAL | Qty: 1

## 2023-04-04 MED FILL — ASPIRIN LOW DOSE 81 MG PO TBEC: 81 MG | ORAL | Qty: 1

## 2023-04-04 MED FILL — DILTIAZEM HCL ER COATED BEADS 180 MG PO CP24: 180 MG | ORAL | Qty: 1

## 2023-04-04 MED FILL — LOVENOX 30 MG/0.3ML IJ SOSY: 30 MG/0.3ML | INTRAMUSCULAR | Qty: 0.3

## 2023-04-04 MED FILL — HEALTHYLAX 17 G PO PACK: 17 g | ORAL | Qty: 1

## 2023-04-04 MED FILL — INSULIN LISPRO 100 UNIT/ML IJ SOLN: 100 UNIT/ML | INTRAMUSCULAR | Qty: 4

## 2023-04-04 MED FILL — ACETAMINOPHEN 325 MG PO TABS: 325 MG | ORAL | Qty: 2

## 2023-04-04 NOTE — Plan of Care (Signed)
Problem: Respiratory - Adult  Goal: Achieves optimal ventilation and oxygenation  04/04/2023 0940 by Murriel Hopper, RN  Outcome: Progressing  04/04/2023 0037 by Sherrie Mustache, RN  Outcome: Not Progressing

## 2023-04-04 NOTE — Progress Notes (Signed)
Assessment complete. VSS. Patient resting in bed. Respirations even and easy. Call light in reach. Fall precautions in place. No needs expressed at this time.

## 2023-04-04 NOTE — Progress Notes (Signed)
[facility]   Nephrology progress Note  [phone]  [phone]   [email or web address]         Reason for Consult:  risk stratification for CCAth    HISTORY OF PRESENT ILLNESS:      The patient is a 67 y.o. female with significant past medical history of CKD stage IIIb, baseline creatinine 1.3-1.5 mg/dL follows with Dr. Orlev-Shitrit/Dr. Melvyn Neth, hypertension, chronic diastolic heart failure with EF 55-60%, who presents with chest pain. Renal indices at baseline. Cardiology is consulted and Ccath is planned. Patient did get contrast study on 03/31/23. CT PE, negative for PE      Subjective:    Doing ok       PHYSICAL EXAM:    Vitals:    BP 126/77   Pulse 55   Temp 98.5 F (36.9 C) (Oral)   Resp 16   Wt (!) 140.8 kg (310 lb 6 oz)   SpO2 94%   BMI 51.65 kg/m     Intake/Output Summary (Last 24 hours) at 04/04/2023 1545  Last data filed at 04/04/2023 1450  Gross per 24 hour   Intake 1020 ml   Output 600 ml   Net 420 ml         Physical Exam:  CONSTITUTIONAL/PSYCHIATRY: awake, alert and oriented x3. Not in acute distress, morbidly obese  EYES: Conjunctivae: normal. Pupils: reactive to light  RESPIRATORY: Respiratory effort: normal. Auscultation: clear but decreased air entry   CARDIOVASCULAR: Auscultation: regular. No JVD, Edema: none.   GASTROINTESTINAL: Soft, nontender, nondistended. Liver: normal in size  EXTREMITIES:  No cyanosis or clubbing.  SKIN: Warm and dry. No significant rashes    DATA:    RFP:   Recent Labs     04/02/23  0537 04/03/23  0548 04/04/23  0453   NA 138 140 140   K 4.1 4.4 4.1   CL 99 103 105   CO2 30 27 26    BUN 26* 22* 19   CREATININE 1.4* 1.2 1.2   CALCIUM 9.7 9.5 9.8   MG 2.00 1.90 1.90         Liver panel:  No results for input(s): "ALB", "TP", "AST", "ALT" in the last 72 hours.    Invalid input(s): "TBIL"      Endocrine:   @BRIEFLAB72 (VitD,PTH,TSH,aldosterone,renin activity,cortisol,metanephrine)@    CBC:   No results for input(s): "WBC", "HGB", "HCT", "MCV", "PLT" in  the last 72 hours.      Iron Panel:   @BRIEFLAB24 (FERA,FE)@    Serology: @BRIEFLAB24 (ANAS,ANCA,C3,C4,RNP,AGBM,DNA,SSA,SSB,SPEP,UPEP,ESR,HEPT)@    Urine studies: @BRIEFLAB24 (UAPR,UCOL,UNAR,UUNR,UCRR,UCLR,UKR,UUAR,UOSM,EOS)@      IMPRESSION/RECOMMENDATIONS:      CKD stage IIIb - baseline creatinine 1.3-1.5 mg/dL. Follows with Dr. Olen Pel. 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 No proteinuria per outpatient notes. FLC/SPEP normal.Torsemide was increased to 20 mg due to edema.   - at this time, patient's renal indices are at baseline. She did receive IV contrast on 03/31/23 and therefore ccath is postponed to next week.   - plan to start IVF tonight  - low risk for CIN     Chest pain - cardiology following. Ccath planned. Diuretics on hold    Hypertension - controlled. Continue current    Chronic diastolic heart failure - appears compensated.     Thank you for allowing me to participate in the care of this patient. Please contact via [employer] if any questions or concerns.       Judson Roch, MD  04/04/2023

## 2023-04-04 NOTE — Progress Notes (Signed)
[facility] PROGRESS NOTE    04/04/2023 1:27 PM        Name: Rachael Mays .              Admitted: 03/31/2023  Primary Care Provider: Maximino Sarin, DO (Tel: [phone])        Subjective:          In bed no nausea vomiting still having some shortness of breath    Current Medications  polyethylene glycol (GLYCOLAX) packet 17 g, Daily  oxyCODONE-acetaminophen (PERCOCET) 7.5-325 MG per tablet 1 tablet, Q6H PRN  aspirin EC tablet 81 mg, Daily  albuterol sulfate HFA (PROVENTIL;VENTOLIN;PROAIR) 108 (90 Base) MCG/ACT inhaler 2 puff, Q6H PRN  Vitamin D (CHOLECALCIFEROL) tablet 2,000 Units, Daily  oyster shell calcium w/D 500-5 MG-MCG tablet 2 tablet, BID  rosuvastatin (CRESTOR) tablet 40 mg, Daily  magnesium oxide (MAG-OX) tablet 400 mg, Daily  montelukast (SINGULAIR) tablet 10 mg, Nightly  ranolazine (RANEXA) extended release tablet 500 mg, BID  isosorbide mononitrate (IMDUR) extended release tablet 30 mg, Daily  nitroGLYCERIN (NITROSTAT) SL tablet 0.4 mg, Q5 Min PRN  gabapentin (NEURONTIN) capsule 300 mg, Nightly  [Held by provider] spironolactone (ALDACTONE) tablet 25 mg, Daily  dilTIAZem (CARDIZEM CD) extended release capsule 180 mg, Daily  [Held by provider] torsemide (DEMADEX) tablet 20 mg, Daily  pantoprazole (PROTONIX) tablet 40 mg, QAM AC  sodium chloride flush 0.9 % injection 5-40 mL, 2 times per day  sodium chloride flush 0.9 % injection 10 mL, PRN  0.9 % sodium chloride infusion, PRN  ondansetron (ZOFRAN-ODT) disintegrating tablet 4 mg, Q8H PRN   Or  ondansetron (ZOFRAN) injection 4 mg, Q6H PRN  magnesium hydroxide (MILK OF MAGNESIA) 400 MG/5ML suspension 30 mL, Daily PRN  acetaminophen (TYLENOL) tablet 650 mg, Q6H PRN   Or  acetaminophen (TYLENOL) suppository 650 mg, Q6H PRN  enoxaparin Sodium (LOVENOX) injection 30 mg, BID  potassium chloride (KLOR-CON M) extended release tablet 40 mEq, PRN   Or  potassium bicarb-citric  acid (EFFER-K) effervescent tablet 40 mEq, PRN   Or  potassium chloride 10 mEq/100 mL IVPB (Peripheral Line), PRN  magnesium sulfate 2000 mg in 50 mL IVPB premix, PRN  hydrALAZINE (APRESOLINE) injection 10 mg, Q6H PRN  sodium chloride 0.9 % bolus 500 mL, PRN  insulin lispro (HUMALOG) injection vial 0-8 Units, TID WC  insulin lispro (HUMALOG) injection vial 0-4 Units, Nightly  dextrose bolus 10% 125 mL, PRN   Or  dextrose bolus 10% 250 mL, PRN  dextrose 10 % infusion, Continuous PRN  lidocaine 4 % external patch 1 patch, Daily        Objective:  BP 126/77   Pulse 55   Temp 98.5 F (36.9 C) (Oral)   Resp 16   Wt (!) 140.8 kg (310 lb 6 oz)   SpO2 94%   BMI 51.65 kg/m     Intake/Output Summary (Last 24 hours) at 04/04/2023 1327  Last data filed at 04/04/2023 1131  Gross per 24 hour   Intake 1020 ml   Output 1300 ml   Net -280 ml        Wt Readings from Last 3 Encounters:   04/04/23 (!) 140.8 kg (310 lb 6 oz)   02/08/23 136.1 kg (300 lb)   09/29/22 (!) 138.3 kg (305 lb)       General appearance:  Appears comfortable. AAOx3  HEENT: atraumatic, Pupils equal, muscous membranes moist, no masses appreciated  Cardiovascular: Regular rate and rhythm no murmurs appreciated  Respiratory:  CTAB no wheezing  Gastrointestinal: Abdomen soft, non-tender, BS+  EXT: trace edema  Neurology: no gross focal deficts  Psychiatry: Appropriate affect. Not agitated  Skin: Warm, dry, no rashes appreciated       Labs and Tests:  CBC:   No results for input(s): "WBC", "HGB", "PLT" in the last 72 hours.    BMP:    Recent Labs     04/02/23  0537 04/03/23  0548 04/04/23  0453   NA 138 140 140   K 4.1 4.4 4.1   CL 99 103 105   CO2 30 27 26    BUN 26* 22* 19   CREATININE 1.4* 1.2 1.2   GLUCOSE 119* 121* 148*       Hepatic:   No results for input(s): "AST", "ALT", "ALB", "BILITOT", "ALKPHOS" in the last 72 hours.    XR CHEST (2 VW)   Final Result   No acute process.         CT CHEST PULMONARY EMBOLISM W CONTRAST   Final Result   No evidence of  pulmonary embolism or acute pulmonary abnormality.         XR CHEST PORTABLE   Final Result   No acute process.             Recent imaging reviewed    Problem List  Principal Problem:    Chronic diastolic heart failure (HCC)  Active Problems:    Coronary artery disease due to lipid rich plaque    Dyslipidemia    Type 2 diabetes mellitus with diabetic polyneuropathy, without long-term current use of insulin (HCC)    AKI (acute kidney injury) (HCC)    Morbid obesity with BMI of 50.0-59.9, adult (HCC)    Dizziness    Essential hypertension    Chest pain with high risk for cardiac etiology    CKD (chronic kidney disease)    Elevated troponin  Resolved Problems:    * No resolved hospital problems. *       Assessment/Plan:   Chest pain  Cath on Monday BMP in a.m.    Ckd3 monitor, creatinine improved    Nausea s/p golitely improved     Dm2 ssi     DVT prophylaxis lovenox  Code status full code      Mikal Plane, MD   04/04/2023 1:27 PM

## 2023-04-04 NOTE — Plan of Care (Signed)
Problem: Chronic Conditions and Co-morbidities  Goal: Patient's chronic conditions and co-morbidity symptoms are monitored and maintained or improved  Outcome: Progressing     Problem: Safety - Adult  Goal: Free from fall injury  Outcome: Progressing     Problem: Skin/Tissue Integrity - Adult  Goal: Skin integrity remains intact  Outcome: Progressing     Problem: Respiratory - Adult  Goal: Achieves optimal ventilation and oxygenation  Outcome: Not Progressing

## 2023-04-05 LAB — POCT GLUCOSE
POC Glucose: 127 mg/dl — ABNORMAL HIGH (ref 70–99)
POC Glucose: 128 mg/dl — ABNORMAL HIGH (ref 70–99)
POC Glucose: 140 mg/dl — ABNORMAL HIGH (ref 70–99)
POC Glucose: 165 mg/dl — ABNORMAL HIGH (ref 70–99)

## 2023-04-05 LAB — BASIC METABOLIC PANEL
Anion Gap: 9 (ref 3–16)
BUN: 18 mg/dL (ref 7–20)
CO2: 23 mmol/L (ref 21–32)
Calcium: 9.7 mg/dL (ref 8.3–10.6)
Chloride: 108 mmol/L (ref 99–110)
Creatinine: 1 mg/dL (ref 0.6–1.2)
Est, Glom Filt Rate: 63 (ref >60)
Glucose: 139 mg/dL — ABNORMAL HIGH (ref 70–99)
Potassium: 4 mmol/L (ref 3.5–5.1)
Sodium: 140 mmol/L (ref 136–145)

## 2023-04-05 LAB — MAGNESIUM: Magnesium: 1.9 mg/dL (ref 1.80–2.40)

## 2023-04-05 LAB — EJECTION FRACTION PERCENTAGE: Left Ventricular Ejection Fraction: 60 %

## 2023-04-05 MED ORDER — MIDAZOLAM HCL 2 MG/2ML IJ SOLN: 2 MG/2ML | INTRAMUSCULAR | Status: AC

## 2023-04-05 MED ORDER — SODIUM CHLORIDE 0.9 % IV SOLN
0.9 | INTRAVENOUS | Status: DC | PRN
Start: 2023-04-05 — End: 2023-04-06

## 2023-04-05 MED ORDER — HEPARIN (PORCINE) IN NACL 1000-0.9 UT/500ML-% IV SOLN: 1000-0.9 UT/500ML-% | INTRAVENOUS | Status: AC

## 2023-04-05 MED ORDER — MIDAZOLAM HCL 2 MG/2ML IJ SOLN: 2 MG/ML | INTRAMUSCULAR | Status: AC

## 2023-04-05 MED ORDER — SODIUM CHLORIDE 0.9 % IV SOLN
0.9 | INTRAVENOUS | Status: DC
Start: 2023-04-05 — End: 2023-04-05

## 2023-04-05 MED ORDER — NORMAL SALINE FLUSH 0.9 % IV SOLN
0.9 | INTRAVENOUS | Status: DC | PRN
Start: 2023-04-05 — End: 2023-04-06

## 2023-04-05 MED ORDER — SODIUM CHLORIDE 0.9 % IV SOLN
0.9 | INTRAVENOUS | Status: DC | PRN
Start: 2023-04-05 — End: 2023-04-05

## 2023-04-05 MED ORDER — NORMAL SALINE FLUSH 0.9 % IV SOLN
0.9 | Freq: Two times a day (BID) | INTRAVENOUS | Status: DC
Start: 2023-04-05 — End: 2023-04-06

## 2023-04-05 MED ORDER — SODIUM CHLORIDE 0.9 % IV SOLN
0.9 | INTRAVENOUS | Status: DC
Start: 2023-04-05 — End: 2023-04-06

## 2023-04-05 MED ORDER — LIDOCAINE HCL 1% INJ (MIXTURES ONLY): 1 % | INTRAMUSCULAR | Status: AC

## 2023-04-05 MED ORDER — FENTANYL CITRATE (PF) 100 MCG/2ML IJ SOLN: 100 MCG/2ML | INTRAMUSCULAR | Status: AC

## 2023-04-05 MED ORDER — ONDANSETRON HCL 4 MG/2ML IJ SOLN
4 | Freq: Four times a day (QID) | INTRAMUSCULAR | Status: DC | PRN
Start: 2023-04-05 — End: 2023-04-05

## 2023-04-05 MED ORDER — OXYCODONE-ACETAMINOPHEN 5-325 MG PO TABS
5-325 | ORAL | Status: DC | PRN
Start: 2023-04-05 — End: 2023-04-05

## 2023-04-05 MED ORDER — IOPAMIDOL 76 % IV SOLN
76 | Freq: Once | INTRAVENOUS | Status: AC
Start: 2023-04-05 — End: 2023-04-05
  Administered 2023-04-05: 16:00:00 50 mL

## 2023-04-05 MED ORDER — NORMAL SALINE FLUSH 0.9 % IV SOLN
0.9 | Freq: Two times a day (BID) | INTRAVENOUS | Status: DC
Start: 2023-04-05 — End: 2023-04-05

## 2023-04-05 MED ORDER — ACETAMINOPHEN 325 MG PO TABS
325 | ORAL | Status: DC | PRN
Start: 2023-04-05 — End: 2023-04-06

## 2023-04-05 MED ORDER — HEPARIN SODIUM (PORCINE) 1000 UNIT/ML IJ SOLN: 1000 UNIT/ML | INTRAMUSCULAR | Status: AC

## 2023-04-05 MED ORDER — NORMAL SALINE FLUSH 0.9 % IV SOLN
0.9 | INTRAVENOUS | Status: DC | PRN
Start: 2023-04-05 — End: 2023-04-05

## 2023-04-05 MED ORDER — VERAPAMIL HCL 2.5 MG/ML IV SOLN: 2.5 MG/ML | INTRAVENOUS | Status: AC

## 2023-04-05 MED FILL — ISOSORBIDE MONONITRATE ER 30 MG PO TB24: 30 MG | ORAL | Qty: 1

## 2023-04-05 MED FILL — OXYCODONE-ACETAMINOPHEN 7.5-325 MG PO TABS: ORAL | Qty: 1

## 2023-04-05 MED FILL — HEALTHYLAX 17 G PO PACK: 17 g | ORAL | Qty: 1

## 2023-04-05 MED FILL — HEPARIN (PORCINE) IN NACL 1000-0.9 UT/500ML-% IV SOLN: INTRAVENOUS | Qty: 1000

## 2023-04-05 MED FILL — MIDAZOLAM HCL 2 MG/2ML IJ SOLN: 2 MG/ML | INTRAMUSCULAR | Qty: 2

## 2023-04-05 MED FILL — ROSUVASTATIN CALCIUM 40 MG PO TABS: 40 MG | ORAL | Qty: 1

## 2023-04-05 MED FILL — FENTANYL CITRATE (PF) 100 MCG/2ML IJ SOLN: 100 MCG/2ML | INTRAMUSCULAR | Qty: 2

## 2023-04-05 MED FILL — ASPERCREME LIDOCAINE 4 % EX PTCH: 4 % | CUTANEOUS | Qty: 1

## 2023-04-05 MED FILL — DILTIAZEM HCL ER COATED BEADS 180 MG PO CP24: 180 MG | ORAL | Qty: 1

## 2023-04-05 MED FILL — GABAPENTIN 300 MG PO CAPS: 300 MG | ORAL | Qty: 1

## 2023-04-05 MED FILL — OS-CAL CALCIUM + D3 500-5 MG-MCG PO TABS: 500-5 MG-MCG | ORAL | Qty: 1

## 2023-04-05 MED FILL — ASPIRIN LOW DOSE 81 MG PO TBEC: 81 MG | ORAL | Qty: 1

## 2023-04-05 MED FILL — MAGNESIUM OXIDE -MG SUPPLEMENT 400 (240 MG) MG PO TABS: 400 (240 Mg) MG | ORAL | Qty: 1

## 2023-04-05 MED FILL — OS-CAL CALCIUM + D3 500-5 MG-MCG PO TABS: 500-5 MG-MCG | ORAL | Qty: 2

## 2023-04-05 MED FILL — MONTELUKAST SODIUM 10 MG PO TABS: 10 MG | ORAL | Qty: 1

## 2023-04-05 MED FILL — LIDOCAINE HCL (PF) 1 % IJ SOLN: 1 % | INTRAMUSCULAR | Qty: 30

## 2023-04-05 MED FILL — RANOLAZINE ER 500 MG PO TB12: 500 MG | ORAL | Qty: 1

## 2023-04-05 MED FILL — VERAPAMIL HCL 2.5 MG/ML IV SOLN: 2.5 MG/ML | INTRAVENOUS | Qty: 2

## 2023-04-05 MED FILL — HEPARIN SODIUM (PORCINE) 1000 UNIT/ML IJ SOLN: 1000 UNIT/ML | INTRAMUSCULAR | Qty: 10

## 2023-04-05 MED FILL — VITAMIN D3 25 MCG (1000 UT) PO TABS: 25 MCG (1000 UT) | ORAL | Qty: 2

## 2023-04-05 MED FILL — PANTOPRAZOLE SODIUM 40 MG PO TBEC: 40 MG | ORAL | Qty: 1

## 2023-04-05 NOTE — Progress Notes (Signed)
PATIENT HISTORY    ECHO: DATE: 04/01/23       EF: 60%  STRESS TEST PREFORMED:  Yes FINDINGS:  Stress Nuclear: Date:  03/29/19  Result: Negative     EF: 60%  EKG: Yes    ECG     Result: Normal  Pre CATH Rhythm: Normal Sinus Rhythm  HYPERTENSION: Yes  DYSLIPIDEMIA: Yes  FAMILY HX OF CAD: Yes  PRIOR MI: No  PRIOR PCI: Yes.  Most recent date: 03/31/16  PRIOR CABG: No  CEREBROVASCULAR DX: No  PERIPHERAL ARTERIAL DISEASE: No  CHRONIC LUNG DISEASE: No  TOBACCO: Former.   Quit Date: 12/08/2011  DIABETIC: Yes, Diet controlled  CARDIAC ARREST: {No  DIALYSIS: No  HEART FAILURE: No  FRAILTY SCORE: 4 VULNERABLE (while not dependent on others for IADLs, symptoms limit activities)  CARDIAC CTA PREFORMED:  No  [place] CORONARY CALCIUM SCORE:   Assessed: No  Prior Diagnostic Coronary Angioplasty Procedure:  No

## 2023-04-05 NOTE — Progress Notes (Signed)
[facility] Daily Progress Note      Admit Date:  03/31/2023      Cardiology consult: Shortness of breath, chest pain, dizziness    Subjective:  Rachael Mays doing well this morning. Denies overnight event.      History of present illness:   Presents with a history of coronary artery disease s/p DES of LAD (03/2016), hypertension, hyperlipidemia, diastolic heart failure, and carotid artery stenosis.  She has chronic chest pain and shortness of breath that did not resolve after coronary intervention in 03/2016.  RHC (2017) . Additional history includes DVT/PE,  s/p lap band surgery in 2014. S/p dilation of esophagus 04/2021     02/18/21 LHC showed patent proximal LAD stent with mid 30% stenosis in some views and FFR 0.89.  Medical management was recommended.  LHC 11/22 mild nonobstructive cad with mild myocardial bridging. 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         Patient presents to the hospital with couple day history of shortness of breath associated midsternal chest pressure patient woke up this night with nausea and vomiting and midsternal chest pressure no fevers chills no cough no sick contacts no recent travel patient is legs are slightly swollen.  Patient does not smoke or drink but does have a history of blood clots in the past      Cardiology has been consulted for further evaluation.      She reports dizziness on Thursday, felt like vertigo, room spinning. Then Sunday her shortness of breath worsened and has not improving. She feels that it is similar to when she had stents. She is unable to get a deep breath. She endorses nausea off and on since Thursday. She is tearful during visit. Chest tenderness on palpation. She does feel some palpitations.     Today denies exertional chest pain, SOB/DOE, PND, palpitations, light-headedness, or edema. She was sleeping upon my examination.    Objective:   BP 127/76   Pulse 63   Temp 98.2 F (36.8 C) (Oral)   Resp 16   Wt (!) 140.7 kg (310  lb 3.2 oz)   SpO2 95%   BMI 51.62 kg/m     Intake/Output Summary (Last 24 hours) at 04/05/2023 1027  Last data filed at 04/05/2023 0940  Gross per 24 hour   Intake 510 ml   Output 600 ml   Net -90 ml       Physical Exam:  General:  Awake, alert, oriented x 3, NAD  Skin:  Warm and dry  Neck:  JVD is difficult to assess  Chest:  decreased air exchange bilaterally  Cardiovascular:  RRR S1S2, no S3, no significant murmur  Abdomen:  Soft, ND, NT, No HSM  Extremities:  No edema    Medications:    polyethylene glycol  17 g Oral Daily    aspirin  81 mg Oral Daily    Vitamin D  2,000 Units Oral Daily    oyster shell calcium w/D  2 tablet Oral BID    rosuvastatin  40 mg Oral Daily    magnesium oxide  400 mg Oral Daily    montelukast  10 mg Oral Nightly    ranolazine  500 mg Oral BID    isosorbide mononitrate  30 mg Oral Daily    gabapentin  300 mg Oral Nightly    [Held by provider] spironolactone  25 mg Oral Daily    dilTIAZem  180 mg Oral Daily    [  Held by provider] torsemide  20 mg Oral Daily    pantoprazole  40 mg Oral QAM AC    sodium chloride flush  5-40 mL IntraVENous 2 times per day    enoxaparin  30 mg SubCUTAneous BID    insulin lispro  0-8 Units SubCUTAneous TID WC    insulin lispro  0-4 Units SubCUTAneous Nightly    lidocaine  1 patch Topical Daily      sodium chloride 50 mL/hr at 04/04/23 2207    sodium chloride      dextrose       oxyCODONE-acetaminophen, albuterol sulfate HFA, nitroGLYCERIN, sodium chloride flush, sodium chloride, ondansetron **OR** ondansetron, magnesium hydroxide, acetaminophen **OR** acetaminophen, potassium chloride **OR** potassium alternative oral replacement **OR** potassium chloride, magnesium sulfate, hydrALAZINE, sodium chloride, dextrose bolus **OR** dextrose bolus, dextrose    TELEMETRY (Personally reviewed by me): Sinus     Lab Data:  CBC:   No results for input(s): "WBC", "HGB", "HCT", "MCV", "PLT" in the last 72 hours.    BMP:   Recent Labs     04/03/23  0548 04/04/23  0453  04/05/23  0441   NA 140 140 140   K 4.4 4.1 4.0   CL 103 105 108   CO2 27 26 23    BUN 22* 19 18   CREATININE 1.2 1.2 1.0     LIVER PROFILE:   No results for input(s): "AST", "ALT", "LIPASE", "AMYLASE", "ALB", "BILIDIR", "BILITOT", "ALKPHOS" in the last 72 hours.    PT/INR:   No results for input(s): "PROTIME", "INR" in the last 72 hours.    APTT:   No results for input(s): "APTT" in the last 72 hours.    BNP:  No results for input(s): "BNP" in the last 72 hours.  Imaging/Procedures:       Echo 04/01/23   Summary   Technically difficult study due to body habitus. Definity was administered.   Left ventricular cavity size is normal with normal left ventricular wall   thickness. Ejection fraction is visually estimated to be 55-60%. Grade I   diastolic dysfunction with normal LV filling pressures. E/e' = 11.1.   Non-coronary cusp appears restricted.   Mild aortic stenosis with a peak velocity of 2.26m/s and a mean pressure   gradient of . AVA (VTI): 2.18 cm.2.   RVSP is estimated to be 21-24 mmHG.   IVC not well visualized.   Right atrium is not well visualized, but appears normal in size.   Right ventricle is not well visualized, but appears dilated.   There appears to be some calcification of the posterior mitral valve leaflet   without evidence of stenosis.    CTPA 03/31/2023  IMPRESSION:  No evidence of pulmonary embolism or acute pulmonary abnormality.     Cardiac cath 10/14/21 [facility] B[facility])  Anatomy:   LM-normal  LAD-patent proximal stent with 10% in-stent restenosis, 30% mid with mild myocardial bridging  Cx-10% to 20% proximal  OM1-normal  RCA-normal  LVEF-70%     Impression:  1.  Mild nonobstructive CAD.  2.  Hyperdynamic LV systolic function.  3.  High resting heart rate.     Plan:  1.  Recommend addition of diltiazem CD 180 mg daily.  To accommodate addition of diltiazem reduce Ranexa to 500 mg twice daily.  Also would decrease lisinopril to 20 mg daily to allow for enough blood pressure to accommodate addition  of diltiazem.  Diltiazem may be helpful should there be a component of microvascular angina.  Cardiac cath 02/18/21 ([facility])   Angiographic Findings:  Right dominant system   Left Main:  Normal     Left Anterior Descending:  Patent proximal stent. Mid 30% stenosis,   irregular appearing in some views. FFR 0.89.   Circumflex:  No obstructive disease   Right Coronary:  No obstructive disease   Left Ventriculogram:  LVEF 55-60%     Conclusions:   -No obstructive CAD   Recommendations:   -Follow-up with GI   -Continue medical therapy      Lexiscan myoview 01/29/21 ([facility])  Interpetation Summary:   1. No no change of baseline chest pain after regadenoson injection.   2. No evidence of stress-induced ischemia on EKG.   3. There is evidence of ischemia in the territory subtended by the left anterior descending coronary artery.   4. Normal LV systolic function.          Carotid duplex 11/12/20: [facility])      o 50-69% stenosis of the right internal carotid artery based on velocity criteria.      o No hemodynamically significant lesions are identified on the left internal or common carotid(s).      o There is antegrade flow demonstrated in the right vertebral artery.      o The left vertebral artery could not be visualized.      Echocardiogram 05/27/20: ([facility])     The left ventricle is normal in size.   There is normal left ventricular wall thickness.   Left ventricular systolic function is normal. (WGNF>/=62%)   Overall left ventricular ejection fraction is estimated to be 60-65%.   Normal Diastolic Function.   No significant valvular abnormalities identified.            Procedure:          LHC 08/09/19  Indication:          Unstable angina     Procedure Details:  Consent Access Bleed R Sedat Start Stop Versed Fentanyl Contrast Fluoro EBL Comp Spec   Yes RRA low MCSFC 1457 1522 4 150 51 1.9 <20 None None   *Ultrasound Note: Ultrasound guidance used to determine aforementioned artery patency, size (>2mm),  anatomic variations and ideal puncture location.  Real-time ultrasound utilized concurrent with vascular needle entry into the artery.  Image(s) permanently recorded and reported in the patient chart.  *Sedation Note: MCSFC = minimal conscious sedation for comfort     Findings:  Artery Findings/Result   LM Normal   LAD Widely patent stent, 50% ostial D1 jailed by stent (no change)   Cx Normal   RI NA   RCA Normal   LVEDP 6   LVG 60%      Intervention:         None     Post Cath Dx:       Nonobstructive CAD, patent stent LAD     Cardiac Cath 05/07/17:  Anatomy:   LM-normal  LAD-normal, widely patent stent mid  Cx-normal  OM1- normal  RCA-normal, right dominant  RPDA- normal  LVEF- 60%     Impression:  1.  Normal coronary artery anatomy.  2.  Widely patent mid LAD stent.  3.  Normal LV systolic function.  4.  False positive stress test.  Likely breast artifact.     Plan:  1.  Medical management.  2.  She needs workup of noncardiac causes of chest pain.  She now has to stress test with similar abnormalities most consistent with breast  artifact.  The majority of her coronary angiograms have demonstrated normal anatomy except for the one that had a 70% mid LAD stenosis which was stented.  Her chest pain has atypical features.  Recommend rheumatology and possibly orthopedic evaluation.     Echo 08/05/16 ([address] [place], [place])  LV with normal systolic function,  EF 55-60%  Mild MR with multiple jets  Very mild AS  Mild TR     Cardiac Cath 07/17/16:  Anatomy:   LM-normal  LAD-widely patent stent otherwise normal  Cx-normal, dominant  OM1- normal  RCA-normal  LVEF- 70%, LVEDP 15 mmHg     Impression:  1.  Widely patent LAD stent otherwise normal coronary arteries  2.  Hyperdynamic LV systolic function  3.  Non-cardiac chest pain     Plan:  1.  Medical management     Cardiac Cath 03/31/16:  Anatomy:   LM-normal   LAD-70% mid with FFR 0.77  Cx-normal, codominant  OM1- normal  RCA-normal  RPDA- normal  LVEF- 70%, LVEDP 19   PCI: LAD  70% to 0% with 3.25 mm x 18 mm Xience Alpine drug-eluting stent     Impression:  1.  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  2.  Hyperdynamic LV systolic function.  3.  Mildly decompensated diastolic CHF.  4.  Moderate pulmonary hypertension.     Cardiac cath 09/23/15: normal coronary arteries         Assessment/Plan:  Principal Problem:  Chronic diastolic heart failure (HCC)  Plan:   proBNP normal but can be misleading and obesity.    Coronary artery disease due to lipid rich plaque  Plan:  History of PCI with stent placement to the LAD.  Widely patent by cardiac cath in 2022.  Planning left heart catheterization due to multiple complaints including shortness of breath and chest discomfort.  Concern for significant recurrent ischemic CAD. If no significant plaque identified at cardiac cath, consider stress PET at [facility] or UC as outpatient.    LHC today   Based on these findings I recommend left heart cath for definitive evaluation of coronary arteries.  Risks, benefits, expectations, and alternative treatments were discussed.  Questions appropriately answered.  Rachael Mays agrees to proceed and verbalized understanding.      Elevated troponin  Plan:   Likely multifactorial.    CKD / AKI (acute kidney injury) (HCC)  Plan:   CRE 1.0 today   Holding nephrotoxic drugs   Nephrology following    Morbid obesity with BMI of 50.0-59.9, adult (HCC)  Plan:  Weight loss encouraged.    Dizziness  Plan:   Etiology unclear.  Difficult history.    Essential hypertension  Plan:   Stable.  Continue current medical regimen.    Dyslipidemia  Plan:   Stable.  Continue current medical regimen.    Type 2 diabetes mellitus with diabetic polyneuropathy, without long-term current use of insulin (HCC)  Plan:   Per primary service.      Scribe's Attestation: This note was scribed in the presence of Dr. Durenda Guthrie, MD by Elinor Dodge, RN 04/05/2023 7:43 AM EDT    I, Durenda Guthrie, MD,  personally performed the services described in this documentation as scribed, in my presence, and it is both accurate and complete.          Durenda Guthrie, MD 04/05/2023 10:27 AM

## 2023-04-05 NOTE — Plan of Care (Signed)
Problem: Safety - Adult  Goal: Free from fall injury  Outcome: Progressing     Problem: Chronic Conditions and Co-morbidities  Goal: Patient's chronic conditions and co-morbidity symptoms are monitored and maintained or improved  Outcome: Progressing     Problem: Respiratory - Adult  Goal: Achieves optimal ventilation and oxygenation  Outcome: Progressing     Problem: Skin/Tissue Integrity - Adult  Goal: Skin integrity remains intact  Outcome: Progressing     Problem: Pain  Goal: Verbalizes/displays adequate comfort level or baseline comfort level  Outcome: Progressing

## 2023-04-05 NOTE — Progress Notes (Signed)
[facility]   Nephrology progress Note  [phone]  [phone]   [email or web address]         Reason for Consult:  risk stratification for CCAth    HISTORY OF PRESENT ILLNESS:      The patient is a 67 y.o. female with significant past medical history of CKD stage IIIb, baseline creatinine 1.3-1.5 mg/dL follows with Dr. Orlev-Shitrit/Dr. Melvyn Neth, hypertension, chronic diastolic heart failure with EF 55-60%, who presents with chest pain. Renal indices at baseline. Cardiology is consulted and Ccath is planned. Patient did get contrast study on 03/31/23. CT PE, negative for PE      Subjective/interval history  Patient seen and examined  Going for LHC today  Creatinine stable  No chest pain or shortness of breath    PHYSICAL EXAM:    Vitals:    BP 127/76   Pulse 63   Temp 98.2 F (36.8 C) (Oral)   Resp 16   Wt (!) 140.7 kg (310 lb 3.2 oz)   SpO2 95%   BMI 51.62 kg/m     Intake/Output Summary (Last 24 hours) at 04/05/2023 1027  Last data filed at 04/05/2023 0940  Gross per 24 hour   Intake 510 ml   Output 600 ml   Net -90 ml         Physical Exam:  CONSTITUTIONAL/PSYCHIATRY: awake, alert and oriented x3. Not in acute distress, morbidly obese  EYES: Conjunctivae: normal. Pupils: reactive to light  RESPIRATORY: Respiratory effort: normal. Auscultation: clear but decreased air entry   CARDIOVASCULAR: Auscultation: regular. No JVD, Edema: none.   GASTROINTESTINAL: Soft, nontender, nondistended. Liver: normal in size  EXTREMITIES: Bilateral lower extremity edema +    DATA:    RFP:   Recent Labs     04/03/23  0548 04/04/23  0453 04/05/23  0441   NA 140 140 140   K 4.4 4.1 4.0   CL 103 105 108   CO2 27 26 23    BUN 22* 19 18   CREATININE 1.2 1.2 1.0   CALCIUM 9.5 9.8 9.7   MG 1.90 1.90 1.90         IMPRESSION/RECOMMENDATIONS:      CKD stage IIIb - baseline creatinine 1.3-1.5 mg/dL. Follows with Dr. Olen Pel. 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 No proteinuria per outpatient notes. FLC/SPEP  normal.Torsemide was increased to 20 mg due to edema.   - at this time, patient's renal indices are at baseline. She did receive IV contrast on 03/31/23 and therefore ccath is postponed to this week  Discussed about risk of contrast associated nephropathy, she expressed understanding  Continue isotonic fluids to mitigate the risk    Chest pain - cardiology following. Ccath planned. Diuretics on hold    Hypertension - controlled. Continue current    Chronic diastolic heart failure - appears compensated.     Thank you for allowing me to participate in the care of this patient. Please contact via [employer] if any questions or concerns.       Harlow Mares, MD  04/05/2023

## 2023-04-05 NOTE — Procedures (Signed)
Patient:  Rachael Mays   DOB:   17-Mar-1956    PROCEDURAL SUMMARY  ~Consent:   Obtained written and verbal consent      Risks/benefits explained in detail  ~Procedure:    Left Heart Catheterization  ~Medications:    Procedural sedation with minimal conscious sedation  ~Complications:   None  ~Blood Loss:    <10cc  ~Specimens:    None obtained  ~Pre-sedation re-evaluation: Performed immediately prior to procedure.  ~Performing Physician:          Durenda Guthrie, MD    ~Assistants:       None    INDICATIONS:  Pre-Procedure Diagnosis:  New Onset Angina <= 2 months, Worsening Angina, and Suspected CAD    Post-Procedure Diagnosis: same    PROCEDURES PERFORMED:   Left heart catheterization with    .    PROCEDURE DETAILS/TECHNIQUE:  Local anesthetic was given and access was obtained in the right Radial Artery using a micropuncture technique and a (5/6) Fr Slender Terumo Sheath was placed without difficulty. Catheters were advanced over a 0.35 wire under fluoroscopic guidance  Left coronary angiography was done using a 5 Fr Judkins L 3.5 diagnostic catheter.  Right coronary angiography was done using a 5 Fr Judkins R4 diagnostic catheter.  Left ventriculography was done using a 5 Fr pigtail catheter. At the end of the procedure a TR band device was used for hemostasis.     ANGIOGRAM/CORONARY ARTERIOGRAM:     Left main artery Bifurcates into the left anterior descending artery and left circumflex artery nml   Left anterior descending artery Gives rise to 2 diagonal arteries Mid stent patent   Diagonals  nml   Left circumflex artery Non-dominant vessel that gives rise to 2 obtuse marginal arteries nml   Obtuse Marginals  nml     Right coronary artery Dominant vessel that gives rise to the posterior descending artery and posterolateral branch nml   Posterior descending artery Posterior lateral branch  nml  nml       LEFT VENTRICULOGRAM:  Left ventricular angiogram was done in the 30 RAO projection and revealed  LVEF- 60%  LVG-  nml  LVEDP- 19  Aortic pressure- 113/68 There was no gradient between the left ventricle and aorta    MEDICATION AND PROCEDURAL RECONCILIATION:  An independent trained observer pushed medications at my direction. We monitored the patient's level of consciousness and vital signs/physiologic status throughout the procedure duration (see start and stop times below).  Sedation: 5 mg Versed, 200 mcg Fentanyl  Sedation start: 1118  Sedation stop: 1137  Contrast: 50 cc of Isovue   Flouro Time: 1.7 minutes of fluoroscopy     IMPRESSION/SUMMARY  ~Coronary Angiography w/ patent stent  ~LVG with LVEF of 60% and no regional wall motion abnormalities    RECOMMENDATION:  ~Aggressive medical treatment and risk factor modification  ~1. Medications reviewed, no changes at this time.  2. Post cath IVF. Bedrest.  4. Patient has been advised on the importance of regular exercise of at least 20-30 minutes daily alternating with aerobic and isometric activities.   5. Patient counseled about and offered assistance for smoking cessation   6. No indication for cardiac rehab  7. Follow up in 2 months with cardiology      Durenda Guthrie, MD, MD   Interventional Cardiology  04/05/2023 11:42 AM

## 2023-04-05 NOTE — Progress Notes (Signed)
Patient brought over to CVU from cath lab and attached to CVU monitoring.  Report reviewed with cath lab RN.  R radial soft and no hematoma or oozing noted.  Occlusive dressing dry and intact.

## 2023-04-05 NOTE — Pre Sedation (Signed)
Brief Pre-Op Note/Sedation Assessment      Rachael Mays  1956/11/20  [id number]  8:47 AM    Planned Procedure: Cardiac Catheterization Procedure  Post Procedure Plan: Return to same level of care  Consent: I have discussed with the patient and/or the patient representative the indication, alternatives, and the possible risks and/or complications of the planned procedure and the anesthesia methods. The patient and/or patient representative appear to understand and agree to proceed.        Chief Complaint:   Chest Pain/Pressure      Indications for Cath Procedure:  Presentation:  New Onset Angina <= 2 months, Worsening Angina, and Suspected CAD  2.  Anginal Classification within 2 weeks:  CCS III - Symptoms with everyday living activities, i.e., moderate limitation  3.  Angina Symptoms Assessment:  Atypical Chest Pain  4.  Heart Failure Class within last 2 weeks:  No symptoms  5.  Cardiovascular Instability:  No    Prior Ischemic Workup/Eval:  Pre-Procedural Medications: Yes: Antiarrhythmic Agent Other, Aspirin, Long Acting Nitrates (Any), and STATIN  2.   Stress Test Completed?  No    Does Patient need surgery?  Cath Valve Surgery:  No    Pre-Procedure Medical History:  Vital Signs:  BP (!) 166/80   Pulse 55   Temp 98.3 F (36.8 C) (Oral)   Resp 16   Wt (!) 140.7 kg (310 lb 3.2 oz)   SpO2 95%   BMI 51.62 kg/m     Allergies:    Allergies   Allergen Reactions    Ibuprofen      Due to lap band erosion    Lisinopril      Memory loss     Seasonal     Talwin [Pentazocine] Other (See Comments)     dizzy     Medications:    Current Facility-Administered Medications   Medication Dose Route Frequency Provider Last Rate Last Admin    0.9 % sodium chloride infusion   IntraVENous Continuous Judson Roch, MD 50 mL/hr at 04/04/23 2207 New Bag at 04/04/23 2207    polyethylene glycol (GLYCOLAX) packet 17 g  17 g Oral Daily Orlinda Blalock, PA-C   17 g at 04/04/23 0918    oxyCODONE-acetaminophen (PERCOCET) 7.5-325 MG  per tablet 1 tablet  1 tablet Oral Q6H PRN Mikal Plane, MD   1 tablet at 04/05/23 0626    aspirin EC tablet 81 mg  81 mg Oral Daily Barb Merino, MD   81 mg at 04/04/23 0918    albuterol sulfate HFA (PROVENTIL;VENTOLIN;PROAIR) 108 (90 Base) MCG/ACT inhaler 2 puff  2 puff Inhalation Q6H PRN Barb Merino, MD        Vitamin D (CHOLECALCIFEROL) tablet 2,000 Units  2,000 Units Oral Daily Barb Merino, MD   2,000 Units at 04/04/23 0981    oyster shell calcium w/D 500-5 MG-MCG tablet 2 tablet  2 tablet Oral BID Barb Merino, MD   2 tablet at 04/04/23 2053    rosuvastatin (CRESTOR) tablet 40 mg  40 mg Oral Daily Barb Merino, MD   40 mg at 04/04/23 1914    magnesium oxide (MAG-OX) tablet 400 mg  400 mg Oral Daily Barb Merino, MD   400 mg at 04/04/23 0918    montelukast (SINGULAIR) tablet 10 mg  10 mg Oral Nightly Barb Merino, MD   10 mg at 04/04/23 0918    ranolazine (RANEXA) extended release tablet 500 mg  500 mg Oral BID Barb Merino, MD   500 mg at 04/04/23 2054    isosorbide mononitrate (IMDUR) extended release tablet 30 mg  30 mg Oral Daily Barb Merino, MD   30 mg at 04/04/23 1610    nitroGLYCERIN (NITROSTAT) SL tablet 0.4 mg  0.4 mg SubLINGual Q5 Min PRN Barb Merino, MD        gabapentin (NEURONTIN) capsule 300 mg  300 mg Oral Nightly Barb Merino, MD   300 mg at 04/04/23 2054    [Held by provider] spironolactone (ALDACTONE) tablet 25 mg  25 mg Oral Daily Barb Merino, MD   25 mg at 04/01/23 9604    dilTIAZem (CARDIZEM CD) extended release capsule 180 mg  180 mg Oral Daily Barb Merino, MD   180 mg at 04/04/23 5409    [Held by provider] torsemide (DEMADEX) tablet 20 mg  20 mg Oral Daily Barb Merino, MD   20 mg at 04/01/23 0909    pantoprazole (PROTONIX) tablet 40 mg  40 mg Oral QAM AC Barb Merino, MD   40 mg at 04/05/23 0604    sodium chloride flush 0.9 % injection 5-40 mL  5-40 mL IntraVENous 2 times per day Barb Merino, MD   10 mL at  04/04/23 2102    sodium chloride flush 0.9 % injection 10 mL  10 mL IntraVENous PRN Barb Merino, MD   10 mL at 04/01/23 1743    0.9 % sodium chloride infusion   IntraVENous PRN Barb Merino, MD        ondansetron (ZOFRAN-ODT) disintegrating tablet 4 mg  4 mg Oral Q8H PRN Barb Merino, MD        Or    ondansetron [facility]) injection 4 mg  4 mg IntraVENous Q6H PRN Barb Merino, MD   4 mg at 04/02/23 8119    magnesium hydroxide (MILK OF MAGNESIA) 400 MG/5ML suspension 30 mL  30 mL Oral Daily PRN Barb Merino, MD        acetaminophen (TYLENOL) tablet 650 mg  650 mg Oral Q6H PRN Barb Merino, MD   650 mg at 04/03/23 2048    Or    acetaminophen (TYLENOL) suppository 650 mg  650 mg Rectal Q6H PRN Barb Merino, MD        enoxaparin Sodium (LOVENOX) injection 30 mg  30 mg SubCUTAneous BID Barb Merino, MD   30 mg at 04/04/23 1478    potassium chloride (KLOR-CON M) extended release tablet 40 mEq  40 mEq Oral PRN Barb Merino, MD        Or    potassium bicarb-citric acid (EFFER-K) effervescent tablet 40 mEq  40 mEq Oral PRN Barb Merino, MD        Or    potassium chloride 10 mEq/100 mL IVPB (Peripheral Line)  10 mEq IntraVENous PRN Barb Merino, MD        magnesium sulfate 2000 mg in 50 mL IVPB premix  2,000 mg IntraVENous PRN Barb Merino, MD        hydrALAZINE (APRESOLINE) injection 10 mg  10 mg IntraVENous Q6H PRN Barb Merino, MD        sodium chloride 0.9 % bolus 500 mL  500 mL IntraVENous PRN Barb Merino, MD        insulin lispro (HUMALOG) injection vial 0-8 Units  0-8 Units SubCUTAneous TID WC Barb Merino, MD  insulin lispro (HUMALOG) injection vial 0-4 Units  0-4 Units SubCUTAneous Nightly Barb Merino, MD        dextrose bolus 10% 125 mL  125 mL IntraVENous PRN Barb Merino, MD        Or    dextrose bolus 10% 250 mL  250 mL IntraVENous PRN Barb Merino, MD        dextrose 10 % infusion   IntraVENous Continuous PRN Barb Merino, MD         lidocaine 4 % external patch 1 patch  1 patch Topical Daily Mikal Plane, MD   1 patch at 04/04/23 2207       Past Medical History:    Past Medical History:   Diagnosis Date    AKI (acute kidney injury) (HCC) 01/26/2022    Anxiety     Asthma     Blood circulation, collateral     CAD (coronary artery disease)     CHF (congestive heart failure) (HCC)     Chronic back pain     Deep vein thrombosis     Degeneration of thoracic intervertebral disc 10/15/2017    Depression     GERD (gastroesophageal reflux disease)     NO LONGER SINCE LAP    GERD (gastroesophageal reflux disease) 01/23/2009    Hx of blood clots     Hyperlipidemia     hx; resolved with lap band    Hypertension     Idiopathic peripheral neuropathy 03/26/2017    Melanoma (HCC)     upper back    MRSA (methicillin resistant staph aureus) culture positive 10/17/2019    leg abscess    Obesity     hx of; had lap band    Obstructive sleep apnea 09/18/2015    uses bi-pap    Obstructive sleep apnea (adult) (pediatric) 09/18/2015    Pulmonary embolism (HCC)     Stenosis of right carotid artery 06/16/2021    Type 2 diabetes mellitus with diabetic polyneuropathy, without long-term current use of insulin (HCC) 01/26/2022       Surgical History:    Past Surgical History:   Procedure Laterality Date    BACK SURGERY      neck  plates    BREAST SURGERY      left lumpectomy    CERVICAL FUSION      CESAREAN SECTION      CHOLECYSTECTOMY      COLONOSCOPY      COLONOSCOPY  04/08/2010    COLONOSCOPY N/A 04/18/2021    COLONOSCOPY DIAGNOSTIC performed by Kandice Robinsons, MD at [facility] ENDOSCOPY    CORONARY ANGIOPLASTY  03/31/2016    mid LAD 3.25x18 alpine    DIAGNOSTIC CARDIAC CATH LAB PROCEDURE  08/09/2019    normal    DILATATION, ESOPHAGUS      ENDOSCOPY, COLON, DIAGNOSTIC      HYSTERECTOMY (CERVIX STATUS UNKNOWN)      HYSTERECTOMY, VAGINAL      LAP BAND  05/01/2008    Dr. Suzi Roots    OTHER SURGICAL HISTORY      [place] Filter: curently in place    OTHER SURGICAL  HISTORY  07/05/2013    lap band removal    SHOULDER SURGERY      SKIN CANCER EXCISION  12/29/2017    UPPER GASTROINTESTINAL ENDOSCOPY  05/19/2013    UPPER GASTROINTESTINAL ENDOSCOPY  07/21/2013    ESOPHAGEAL STENT PLACEMENT    UPPER GASTROINTESTINAL ENDOSCOPY N/A 07/31/2014  Esophagogastroduodenoscopy with esophageal balloon dilation    UPPER GASTROINTESTINAL ENDOSCOPY N/A 04/18/2021    EGD DILATION BALLOON performed by Kandice Robinsons, MD at [facility] ENDOSCOPY    VASCULAR SURGERY  04/2015    Donzetta Sprung, celiac artery angiogram, normal abd arteries             Pre-Sedation:  Pre-Sedation Documentation and Exam:  I have assessed the patient and reviewed the H&P on the chart.    Prior History of Anesthesia Complications:   none    Modified Mallampati:  II (soft palate, uvula, fauces visible)    ASA Classification:  Class 2 - A normal healthy patient with mild systemic disease    Aldrete Scale:  Activity:  2 - Able to move 4 extremities voluntarily on command  Respiration:  2 - Able to breathe deeply and cough freely  Circulation:  2 - BP+/- of normal  Consciousness:  2 - Fully awake  Oxygen Saturation (color):  2 - Able to maintain oxygen saturation >92% on room air    Sedation/Anesthesia Plan:  Guard the patient's safety and welfare.  Minimize physical discomfort and pain.  Minimize negative psychological responses to treatment by providing sedation and analgesia and maximize the potential amnesia.  Patient to meet pre-procedure discharge plan.    Medication Planned:  midazolam intravenously and fentanyl intravenously    Patient is an appropriate candidate for plan of sedation:   yes      Electronically signed by Durenda Guthrie, MD on 04/05/2023 at 8:47 AM

## 2023-04-05 NOTE — Progress Notes (Signed)
Assessment complete. VSS. Patient resting in bed. Respirations even and easy. Call light in reach. Fall precautions in place. No needs expressed at this time.

## 2023-04-05 NOTE — Progress Notes (Signed)
[facility] PROGRESS NOTE    04/05/2023 4:26 PM        Name: Rachael Mays .              Admitted: 03/31/2023  Primary Care Provider: Maximino Sarin, DO (Tel: [phone])        Subjective:        Bed no nausea vomiting or chest pain  Current Medications  0.9 % sodium chloride infusion, Continuous  sodium chloride flush 0.9 % injection 5-40 mL, 2 times per day  sodium chloride flush 0.9 % injection 5-40 mL, PRN  0.9 % sodium chloride infusion, PRN  acetaminophen (TYLENOL) tablet 650 mg, Q4H PRN  polyethylene glycol (GLYCOLAX) packet 17 g, Daily  oxyCODONE-acetaminophen (PERCOCET) 7.5-325 MG per tablet 1 tablet, Q6H PRN  aspirin EC tablet 81 mg, Daily  albuterol sulfate HFA (PROVENTIL;VENTOLIN;PROAIR) 108 (90 Base) MCG/ACT inhaler 2 puff, Q6H PRN  Vitamin D (CHOLECALCIFEROL) tablet 2,000 Units, Daily  oyster shell calcium w/D 500-5 MG-MCG tablet 2 tablet, BID  rosuvastatin (CRESTOR) tablet 40 mg, Daily  magnesium oxide (MAG-OX) tablet 400 mg, Daily  montelukast (SINGULAIR) tablet 10 mg, Nightly  ranolazine (RANEXA) extended release tablet 500 mg, BID  isosorbide mononitrate (IMDUR) extended release tablet 30 mg, Daily  nitroGLYCERIN (NITROSTAT) SL tablet 0.4 mg, Q5 Min PRN  gabapentin (NEURONTIN) capsule 300 mg, Nightly  [Held by provider] spironolactone (ALDACTONE) tablet 25 mg, Daily  dilTIAZem (CARDIZEM CD) extended release capsule 180 mg, Daily  [Held by provider] torsemide (DEMADEX) tablet 20 mg, Daily  pantoprazole (PROTONIX) tablet 40 mg, QAM AC  sodium chloride flush 0.9 % injection 5-40 mL, 2 times per day  sodium chloride flush 0.9 % injection 10 mL, PRN  0.9 % sodium chloride infusion, PRN  ondansetron (ZOFRAN-ODT) disintegrating tablet 4 mg, Q8H PRN   Or  ondansetron (ZOFRAN) injection 4 mg, Q6H PRN  magnesium hydroxide (MILK OF MAGNESIA) 400 MG/5ML suspension 30 mL, Daily PRN  acetaminophen (TYLENOL) suppository 650  mg, Q6H PRN  enoxaparin Sodium (LOVENOX) injection 30 mg, BID  potassium chloride (KLOR-CON M) extended release tablet 40 mEq, PRN   Or  potassium bicarb-citric acid (EFFER-K) effervescent tablet 40 mEq, PRN   Or  potassium chloride 10 mEq/100 mL IVPB (Peripheral Line), PRN  magnesium sulfate 2000 mg in 50 mL IVPB premix, PRN  hydrALAZINE (APRESOLINE) injection 10 mg, Q6H PRN  sodium chloride 0.9 % bolus 500 mL, PRN  insulin lispro (HUMALOG) injection vial 0-8 Units, TID WC  insulin lispro (HUMALOG) injection vial 0-4 Units, Nightly  dextrose bolus 10% 125 mL, PRN   Or  dextrose bolus 10% 250 mL, PRN  dextrose 10 % infusion, Continuous PRN  lidocaine 4 % external patch 1 patch, Daily        Objective:  BP (!) 112/57   Pulse 61   Temp 98.2 F (36.8 C) (Temporal)   Resp 16   Wt (!) 140.7 kg (310 lb 3.2 oz)   SpO2 100%   BMI 51.62 kg/m     Intake/Output Summary (Last 24 hours) at 04/05/2023 1626  Last data filed at 04/05/2023 0940  Gross per 24 hour   Intake 30 ml   Output --   Net 30 ml        Wt Readings from Last 3 Encounters:   04/05/23 (!) 140.7 kg (310 lb 3.2 oz)   02/08/23 136.1 kg (300 lb)   09/29/22 (!) 138.3 kg (305 lb)       General appearance:  Appears comfortable. AAOx3  HEENT: atraumatic, Pupils equal, muscous membranes moist, no masses appreciated  Cardiovascular: Regular rate and rhythm no murmurs appreciated  Respiratory: CTAB no wheezing  Gastrointestinal: Abdomen soft, non-tender, BS+  EXT: trace edema  Neurology: no gross focal deficts  Psychiatry: Appropriate affect. Not agitated  Skin: Warm, dry, no rashes appreciated       Labs and Tests:  CBC:   No results for input(s): "WBC", "HGB", "PLT" in the last 72 hours.    BMP:    Recent Labs     04/03/23  0548 04/04/23  0453 04/05/23  0441   NA 140 140 140   K 4.4 4.1 4.0   CL 103 105 108   CO2 27 26 23    BUN 22* 19 18   CREATININE 1.2 1.2 1.0   GLUCOSE 121* 148* 139*       Hepatic:   No results for input(s): "AST", "ALT", "ALB", "BILITOT",  "ALKPHOS" in the last 72 hours.    XR CHEST (2 VW)   Final Result   No acute process.         CT CHEST PULMONARY EMBOLISM W CONTRAST   Final Result   No evidence of pulmonary embolism or acute pulmonary abnormality.         XR CHEST PORTABLE   Final Result   No acute process.             Recent imaging reviewed    Problem List  Principal Problem:    Chronic diastolic heart failure (HCC)  Active Problems:    Coronary artery disease due to lipid rich plaque    Dyslipidemia    Type 2 diabetes mellitus with diabetic polyneuropathy, without long-term current use of insulin (HCC)    AKI (acute kidney injury) (HCC)    Morbid obesity with BMI of 50.0-59.9, adult (HCC)    Dizziness    Essential hypertension    Chest pain with high risk for cardiac etiology    CKD (chronic kidney disease)    Elevated troponin  Resolved Problems:    * No resolved hospital problems. *       Assessment/Plan:   Chest pain  Lhc today willfu  Bmp in am monitor creatinine    Ckd3 monitor, creatinine improved    Nausea s/p golitely improved     Dm2 ssi     DVT prophylaxis lovenox  Code status full code

## 2023-04-06 LAB — BRAIN NATRIURETIC PEPTIDE: Pro-BNP: 135 pg/mL — ABNORMAL HIGH (ref 0–124)

## 2023-04-06 LAB — CBC
Hematocrit: 37.4 % (ref 36.0–48.0)
Hemoglobin: 12.4 g/dL (ref 12.0–16.0)
MCH: 29.1 pg (ref 26.0–34.0)
MCHC: 33.2 g/dL (ref 31.0–36.0)
MCV: 87.7 fL (ref 80.0–100.0)
MPV: 9.2 fL (ref 5.0–10.5)
Platelets: 152 10*3/uL (ref 135–450)
RBC: 4.27 M/uL (ref 4.00–5.20)
RDW: 14.9 % (ref 12.4–15.4)
WBC: 6.3 10*3/uL (ref 4.0–11.0)

## 2023-04-06 LAB — BASIC METABOLIC PANEL
Anion Gap: 12 (ref 3–16)
BUN: 17 mg/dL (ref 7–20)
CO2: 23 mmol/L (ref 21–32)
Calcium: 9.6 mg/dL (ref 8.3–10.6)
Chloride: 106 mmol/L (ref 99–110)
Creatinine: 1.1 mg/dL (ref 0.6–1.2)
Est, Glom Filt Rate: 56 — AB (ref >60)
Glucose: 136 mg/dL — ABNORMAL HIGH (ref 70–99)
Potassium: 4.4 mmol/L (ref 3.5–5.1)
Sodium: 141 mmol/L (ref 136–145)

## 2023-04-06 LAB — POCT GLUCOSE
POC Glucose: 118 mg/dl — ABNORMAL HIGH (ref 70–99)
POC Glucose: 155 mg/dl — ABNORMAL HIGH (ref 70–99)

## 2023-04-06 MED ORDER — TORSEMIDE 20 MG PO TABS
20 | ORAL_TABLET | Freq: Every day | ORAL | 3 refills | Status: DC
Start: 2023-04-06 — End: 2023-05-21

## 2023-04-06 MED FILL — DILTIAZEM HCL ER COATED BEADS 180 MG PO CP24: 180 MG | ORAL | Qty: 1

## 2023-04-06 MED FILL — MONTELUKAST SODIUM 10 MG PO TABS: 10 MG | ORAL | Qty: 1

## 2023-04-06 MED FILL — ROSUVASTATIN CALCIUM 40 MG PO TABS: 40 MG | ORAL | Qty: 1

## 2023-04-06 MED FILL — VITAMIN D3 25 MCG (1000 UT) PO TABS: 25 MCG (1000 UT) | ORAL | Qty: 2

## 2023-04-06 MED FILL — OXYCODONE-ACETAMINOPHEN 7.5-325 MG PO TABS: ORAL | Qty: 1

## 2023-04-06 MED FILL — GABAPENTIN 300 MG PO CAPS: 300 MG | ORAL | Qty: 1

## 2023-04-06 MED FILL — LOVENOX 30 MG/0.3ML IJ SOSY: 30 MG/0.3ML | INTRAMUSCULAR | Qty: 0.3

## 2023-04-06 MED FILL — PANTOPRAZOLE SODIUM 40 MG PO TBEC: 40 MG | ORAL | Qty: 1

## 2023-04-06 MED FILL — RANOLAZINE ER 500 MG PO TB12: 500 MG | ORAL | Qty: 1

## 2023-04-06 MED FILL — ASPIRIN LOW DOSE 81 MG PO TBEC: 81 MG | ORAL | Qty: 1

## 2023-04-06 MED FILL — MAGNESIUM OXIDE -MG SUPPLEMENT 400 (240 MG) MG PO TABS: 400 (240 Mg) MG | ORAL | Qty: 1

## 2023-04-06 MED FILL — OS-CAL CALCIUM + D3 500-5 MG-MCG PO TABS: 500-5 MG-MCG | ORAL | Qty: 2

## 2023-04-06 MED FILL — HEALTHYLAX 17 G PO PACK: 17 g | ORAL | Qty: 1

## 2023-04-06 MED FILL — ISOSORBIDE MONONITRATE ER 30 MG PO TB24: 30 MG | ORAL | Qty: 1

## 2023-04-06 MED FILL — ASPERCREME LIDOCAINE 4 % EX PTCH: 4 % | CUTANEOUS | Qty: 1

## 2023-04-06 NOTE — Progress Notes (Signed)
[facility]   Nephrology progress Note  [phone]  [phone]   [email or web address]         Reason for Consult:  risk stratification for CCAth    HISTORY OF PRESENT ILLNESS:      The patient is a 67 y.o. female with significant past medical history of CKD stage IIIb, baseline creatinine 1.3-1.5 mg/dL follows with Dr. Orlev-Shitrit/Dr. Melvyn Neth, hypertension, chronic diastolic heart failure with EF 55-60%, who presents with chest pain. Renal indices at baseline. Cardiology is consulted and Ccath is planned. Patient did get contrast study on 03/31/23. CT PE, negative for PE      Subjective/interval history  Patient seen and examined  S/p LHC yesterday  Creatinine stable  No chest pain or shortness of breath  Possible discharge today      PHYSICAL EXAM:    Vitals:    BP (!) 127/59   Pulse 54   Temp 97.9 F (36.6 C) (Temporal)   Resp 16   Ht 1.651 m (5\' 5" )   Wt (!) 142.7 kg (314 lb 9.5 oz)   SpO2 97%   BMI 52.35 kg/m     Intake/Output Summary (Last 24 hours) at 04/06/2023 1228  Last data filed at 04/06/2023 1000  Gross per 24 hour   Intake 480 ml   Output --   Net 480 ml         Physical Exam:    CONSTITUTIONAL/PSYCHIATRY: awake, alert and oriented x3. Not in acute distress, morbidly obese  EYES: Conjunctivae: normal. Pupils: reactive to light  RESPIRATORY: Respiratory effort: normal. Auscultation: clear but decreased air entry   CARDIOVASCULAR: Auscultation: regular. No JVD, Edema: none.   GASTROINTESTINAL: Soft, nontender, nondistended. Liver: normal in size  EXTREMITIES: Bilateral lower extremity edema +    DATA:    RFP:   Recent Labs     04/04/23  0453 04/05/23  0441 04/06/23  0454   NA 140 140 141   K 4.1 4.0 4.4   CL 105 108 106   CO2 26 23 23    BUN 19 18 17    CREATININE 1.2 1.0 1.1   CALCIUM 9.8 9.7 9.6   MG 1.90 1.90  --          IMPRESSION/RECOMMENDATIONS:      CKD stage IIIb   baseline creatinine 1.3-1.5 mg/dL.   Follows with Dr. Olen Pel.   Suspected 2/2 cardiorenal  syndrome.   No proteinuria per outpatient notes. FLC/SPEP normal.Torsemide was increased to 20 mg due to edema.   - at this time, patient's renal indices are at baseline. She did receive IV contrast on 03/31/23 and therefore ccath is postponed to this week  Creatinine has been stable so far despite cath     Chest pain    S/p LHC on 4/29 with patent stent     Hypertension - controlled.     Chronic diastolic heart failure   Oral diuretics to be resumed  Would hold off on Spironolactone for now    Plan  Ok to discharge from my perspective  Can follow-up with Dr Olen Pel after discharge    Thank you for allowing me to participate in the care of this patient. Please contact via [employer] if any questions or concerns.       Harlow Mares, MD  04/06/2023

## 2023-04-06 NOTE — Discharge Summary (Signed)
Hospital Medicine Discharge Summary    Patient: Rachael Mays     Gender: female  DOB: Aug 16, 1956   Age: 67 y.o.  MRN: [id number]    Admitting Physician: Mikal Plane, MD  Discharge Physician: Mikal Plane, MD     Code Status: Full Code     Admit Date: 03/31/2023   Discharge Date:   4/30/204    Disposition:  Home  Time spent arranging discharge: 31 minutes    Discharge Diagnoses:    Active Hospital Problems    Diagnosis Date Noted    Dyslipidemia [E78.5] 12/25/2016     Priority: High    Coronary artery disease due to lipid rich plaque [I25.10, I25.83] 03/31/2016     Priority: High    AKI (acute kidney injury) (HCC) [N17.9] 01/26/2022     Priority: Medium    Type 2 diabetes mellitus with diabetic polyneuropathy, without long-term current use of insulin (HCC) [E11.42] 01/26/2022     Priority: Medium    CKD (chronic kidney disease) [N18.9] 04/02/2023    Elevated troponin [R79.89] 04/02/2023    Chronic diastolic heart failure (HCC) [I50.32] 03/31/2023    Chest pain with high risk for cardiac etiology [R07.9] 08/09/2019    Essential hypertension [I10] 07/24/2013    Dizziness [R42] 03/29/2013    Morbid obesity with BMI of 50.0-59.9, adult (HCC) [E66.01, Z68.43] 02/21/2009           Condition at Discharge:  Stable    Hospital Course:   Patient admitted to the hospital with chest pain underwent left heart cath which was negative for acute pathology.  Patient AKI on CKD 3 secondary to contrast nephropathy treated with IV fluids and this resolved patient nausea improved with GoLytely which cleared for discharge Follow-up with cardiology as outpatient      Discharge Exam:    BP (!) 127/59   Pulse 54   Temp 97.9 F (36.6 C) (Temporal)   Resp 16   Ht 1.651 m (5\' 5" )   Wt (!) 142.7 kg (314 lb 9.5 oz)   SpO2 97%   BMI 52.35 kg/m   General appearance:  Appears comfortable. AAOx3  HEENT: atraumatic, Pupils equal, muscous membranes moist, no masses appreciated  Cardiovascular: Regular rate and rhythm no  murmurs appreciated  Respiratory: CTAB no wheezing  Gastrointestinal: Abdomen soft, non-tender, BS+  EXT: no edema  Neurology: no gross focal deficts  Psychiatry: Appropriate affect. Not agitated  Skin: Warm, dry, no rashes appreciated    Discharge Medications:   Current Discharge Medication List        Current Discharge Medication List        Current Discharge Medication List        CONTINUE these medications which have NOT CHANGED    Details   torsemide (DEMADEX) 20 MG tablet Take 1 tablet by mouth daily  Qty: 90 tablet, Refills: 0    Associated Diagnoses: Stage 3b chronic kidney disease (HCC)      dilTIAZem (CARDIZEM CD) 180 MG extended release capsule TAKE ONE CAPSULE BY MOUTH DAILY  Qty: 90 capsule, Refills: 3      spironolactone (ALDACTONE) 25 MG tablet TAKE 1 TABLET BY MOUTH DAILY  Qty: 30 tablet, Refills: 3      fluocinonide (LIDEX) 0.05 % external solution APPLY SPARINGLY TO THE BACK OF THE SCALP DAILY AS NEEDED FOR ITCHING  Qty: 60 mL, Refills: 1      gabapentin (NEURONTIN) 100 MG capsule Take 1 capsule by mouth nightly.  metFORMIN (GLUCOPHAGE) 500 MG tablet Take 1 tablet by mouth 2 times daily (with meals)      isosorbide mononitrate (IMDUR) 30 MG extended release tablet Take 1 tablet by mouth daily  Qty: 90 tablet, Refills: 3      nitroGLYCERIN (NITROSTAT) 0.4 MG SL tablet up to max of 3 total doses. If no relief after 1 dose, call 911.  Qty: 25 tablet, Refills: 3    Associated Diagnoses: Coronary artery disease due to lipid rich plaque      ranolazine (RANEXA) 500 MG extended release tablet TAKE ONE TABLET BY MOUTH TWICE A DAY  Qty: 180 tablet, Refills: 3    Associated Diagnoses: Coronary artery disease due to lipid rich plaque      clobetasol (TEMOVATE) 0.05 % cream Apply to affected area twice daily for up to 2 weeks or until improved.  Qty: 60 g, Refills: 1      omeprazole (PRILOSEC) 40 MG delayed release capsule TAKE ONE CAPSULE BY MOUTH DAILY  Qty: 30 capsule, Refills: 0    Associated  Diagnoses: Gastroesophageal reflux disease without esophagitis      triamcinolone (KENALOG) 0.1 % cream Apply to affected area twice daily for up to 2 weeks or until improved.  Qty: 15 g, Refills: 2      montelukast (SINGULAIR) 10 MG tablet TAKE ONE TABLET BY MOUTH DAILY. **MUST CALL MD FOR APPOINTMENT  Qty: 30 tablet, Refills: 5    Associated Diagnoses: Shortness of breath      oxyCODONE-acetaminophen (PERCOCET) 7.5-325 MG per tablet as needed.      rosuvastatin (CRESTOR) 20 MG tablet daily      magnesium (MAGNESIUM-OXIDE) 250 MG TABS tablet Take 350 mg by mouth daily      Coenzyme Q10 (CO Q10) 200 MG CAPS Take by mouth daily (with breakfast)      calcium citrate-vitamin D (CITRICAL + D) 315-250 MG-UNIT TABS per tablet Take 2 tablets by mouth 2 times daily      Cholecalciferol (VITAMIN D3) 50 MCG (2000 UT) CAPS Take 1 capsule by mouth daily  Qty: 90 capsule, Refills: 3      albuterol sulfate HFA (PROVENTIL HFA) 108 (90 Base) MCG/ACT inhaler Inhale 2 puffs into the lungs every 6 hours as needed for Wheezing  Qty: 3 Inhaler, Refills: 3    Associated Diagnoses: Mild intermittent asthma without complication      lidocaine (LIDODERM) 5 % Place 1 patch onto the skin daily 12 hours on, 12 hours off.  Qty: 30 patch, Refills: 0      aspirin 81 MG EC tablet Take 1 tablet by mouth daily  Qty: 30 tablet, Refills: 11      Multiple Vitamins-Minerals (MULTIVITAMIN PO) Take 1 tablet by mouth daily            Current Discharge Medication List        STOP taking these medications       ketoconazole (NIZORAL) 2 % cream Comments:   Reason for Stopping:         topiramate (TOPAMAX) 25 MG tablet Comments:   Reason for Stopping:         diclofenac sodium (VOLTAREN) 1 % GEL Comments:   Reason for Stopping:               Labs: For convenience and continuity at follow-up the following most recent labs are provided:    Lab Results   Component Value Date/Time    WBC 6.3 04/06/2023 04:54 AM  HGB 12.4 04/06/2023 04:54 AM    HCT 37.4  04/06/2023 04:54 AM    MCV 87.7 04/06/2023 04:54 AM    PLT 152 04/06/2023 04:54 AM    NA 141 04/06/2023 04:54 AM    K 4.4 04/06/2023 04:54 AM    K 4.6 10/17/2019 06:07 AM    CL 106 04/06/2023 04:54 AM    CO2 23 04/06/2023 04:54 AM    BUN 17 04/06/2023 04:54 AM    CREATININE 1.1 04/06/2023 04:54 AM    CALCIUM 9.6 04/06/2023 04:54 AM    PHOS 3.8 08/21/2013 03:46 PM    ALKPHOS 94 04/01/2023 05:29 AM    ALT 14 04/01/2023 05:29 AM    AST 19 04/01/2023 05:29 AM    BILITOT 0.5 04/01/2023 05:29 AM    BILIDIR <0.2 02/18/2022 04:30 PM    LDLCALC 47 04/01/2023 05:29 AM    TRIG 180 04/01/2023 05:29 AM     Lab Results   Component Value Date    INR 1.06 03/31/2023    INR 1.0 02/13/2021    INR 0.88 05/05/2017       Radiology:  XR CHEST (2 VW)    Result Date: 03/31/2023  EXAMINATION: TWO XRAY VIEWS OF THE CHEST 03/31/2023 6:53 pm COMPARISON: 03/31/2023 HISTORY: ORDERING SYSTEM PROVIDED HISTORY: cough TECHNOLOGIST PROVIDED HISTORY: Reason for exam:->cough Reason for Exam: cough and shortness of breath FINDINGS: The lungs are without acute focal process.  There is no effusion or pneumothorax. The cardiomediastinal silhouette is without acute process. The osseous structures are without acute process.     No acute process.     CT CHEST PULMONARY EMBOLISM W CONTRAST    Result Date: 03/31/2023  EXAMINATION: CTA OF THE CHEST 03/31/2023 4:49 pm TECHNIQUE: CTA of the chest was performed after the administration of intravenous contrast.  Multiplanar reformatted images are provided for review.  MIP images are provided for review. Automated exposure control, iterative reconstruction, and/or weight based adjustment of the mA/kV was utilized to reduce the radiation dose to as low as reasonably achievable. COMPARISON: CTA chest 08/09/2019 HISTORY: ORDERING SYSTEM PROVIDED HISTORY: r/o pe TECHNOLOGIST PROVIDED HISTORY: Reason for exam:->r/o pe Additional Contrast?->1 Reason for Exam: r/o pe FINDINGS: Pulmonary Arteries: Pulmonary arteries are  adequately opacified for evaluation.  No evidence of intraluminal filling defect to suggest pulmonary embolism.  Main pulmonary artery is normal in caliber. Mediastinum: No evidence of mediastinal lymphadenopathy.  The heart and pericardium demonstrate no acute abnormality.  There is no acute abnormality of the thoracic aorta. Lungs/pleura: The lungs are without acute process.  No focal consolidation or pulmonary edema.  Calcified granuloma in the right lower lobe.  No evidence of pleural effusion or pneumothorax. Upper Abdomen: There is a stable 1.6 cm indeterminate right adrenal nodule. Limited images of the upper abdomen are otherwise unremarkable. Soft Tissues/Bones: No acute bone or soft tissue abnormality.     No evidence of pulmonary embolism or acute pulmonary abnormality.     XR CHEST PORTABLE    Result Date: 03/31/2023  EXAMINATION: ONE XRAY VIEW OF THE CHEST 03/31/2023 3:37 pm COMPARISON: None. HISTORY: ORDERING SYSTEM PROVIDED HISTORY: SOB TECHNOLOGIST PROVIDED HISTORY: Reason for exam:->SOB Reason for Exam: shortness of breath and chest pressure FINDINGS: The lungs are without acute focal process.  There is no effusion or pneumothorax. The cardiomediastinal silhouette is without acute process. The osseous structures are without acute process.     No acute process.         Signed:  Mikal Plane, MD   04/06/2023

## 2023-04-06 NOTE — Plan of Care (Signed)
Problem: Safety - Adult  Goal: Free from fall injury  04/06/2023 1412 by Isaiah Blakes, RN  Outcome: Adequate for Discharge  04/06/2023 0058 by Dennis Bast, RN  Outcome: Progressing     Problem: Chronic Conditions and Co-morbidities  Goal: Patient's chronic conditions and co-morbidity symptoms are monitored and maintained or improved  04/06/2023 1412 by Isaiah Blakes, RN  Outcome: Adequate for Discharge  04/06/2023 0058 by Dennis Bast, RN  Outcome: Progressing     Problem: Respiratory - Adult  Goal: Achieves optimal ventilation and oxygenation  Outcome: Adequate for Discharge     Problem: Skin/Tissue Integrity - Adult  Goal: Skin integrity remains intact  Outcome: Adequate for Discharge  Flowsheets (Taken 04/06/2023 0800 by Alveria Apley, RN)  Skin Integrity Remains Intact: Monitor for areas of redness and/or skin breakdown     Problem: Pain  Goal: Verbalizes/displays adequate comfort level or baseline comfort level  04/06/2023 1412 by Isaiah Blakes, RN  Outcome: Adequate for Discharge  04/06/2023 0058 by Dennis Bast, RN  Outcome: Progressing     Problem: Discharge Planning  Goal: Discharge to home or other facility with appropriate resources  04/06/2023 1412 by Isaiah Blakes, RN  Outcome: Adequate for Discharge  04/06/2023 0058 by Dennis Bast, RN  Outcome: Progressing

## 2023-04-06 NOTE — Progress Notes (Signed)
Discharge instructions reviewed with patient and family member.  Patient and family verbalized understanding.  All home medications have been reviewed, questions answered and patient voiced understanding. All medication side effects reviewed and patient and family verbalized understanding. Follow up appointment(s) reviewed with patient she is aware she needs to call and set up an appointment with nephrology within the next week.  Patient given discharge instructions and appointment times.  Patient discharged to home with family via private car.  Taken to lobby via wheelchair.

## 2023-04-07 NOTE — Telephone Encounter (Signed)
Formatting of this note is different from the original.  Reached patient hospital follow up reports doing fair reports still has swelling in legs and feet.  No further increased dyspnea, or dizziness.  Assessed if weighing daily reports she is not, instructed on daily weights and reporting weight gain > 3 lbs day or 5 lbs 1 week.  Reviewed medications list updated, reports Aldactone on hold till sees nephrology on 5/6  Had heart cath wrist site unremarkable.  No stents  Sees cardiology 5/8  Enrolled in transitions    Theodoro Parma, [facility] BSN  Ambulatory Care Management  [phone]     04/07/23 1335   Transition of Care Initial   Did you reach patient? Patient/Caregiver/Facility   Date of patient/caregiver contact 04/07/23   Inpatient, ED, Facility Inpatient   Inpatient Admit Date 03/31/23   Inpatient Discharge Date 04/06/23   What [facility] Visit Reason dizziness dyspnea, CHF   Discharge Location Home   What assistive devices/DME do you use at home? Bi[employer]   Are there current needs for any other DME: N   Do you have transportation to your MD appointment? Y   Is this an [employer] RE[employer] contract? No   Do you currently have any problems taking your medications as prescribed? No   Reviewed discharge medication list Y   Review Discharge Meds Outcome Current medication(s) were reviewed with patient and discharge medication(s) reviewed with patient today   Do you currently have any incisions, wounds,drains, lines or tubes? N   Patient has new concern or worsening symptoms: N   Symptom Recognition: Pt/Caregiver is aware of symptoms to report to MD   Team Member educated patient on Right Care: Y   PCP/Specialist Appointment Status (within 10 days): Appt already scheduled   PCP Appt 04/13/23   Specialist Appointment: 04/12/23   Living will in EMR No, not interested   DPOA in EMR No, not interested   RN is enrolling patient in Transitions of Care Y   Criteria for TOC enrollment.  Enroll in [facility] if patient meets 1  or more of the following criteria: Discharge dx of CHF, COPD, Stroke, Sepsis, UTI, LDA's   Consult Interventions Consult;Consult Other;Consult Hospital/ED;Consult MD/Specialist     Electronically signed by Theodoro Parma, Registered Nurse at 04/07/2023  1:44 PM EDT

## 2023-04-08 NOTE — Telephone Encounter (Signed)
[name]  HEART FAILURE PROGRAM  TELEPHONE ENCOUNTER FORM    Rachael Mays 05/01/1956    Attempted to call patient for HF follow-up. No answer at this time.      Next MD/ Clinic appointment: 5/7 with PCP      Kelsen Celona, RN 04/08/2023 11:37 AM

## 2023-04-09 NOTE — Telephone Encounter (Signed)
Two more attempts to reach patient. No answer. She has follow up next week.

## 2023-04-09 NOTE — Telephone Encounter (Signed)
Pt returned call. States she will be here 04/14/23 with npts for hosp fu

## 2023-04-12 ENCOUNTER — Ambulatory Visit
Admit: 2023-04-12 | Discharge: 2023-04-12 | Payer: PRIVATE HEALTH INSURANCE | Attending: Internal Medicine | Primary: Sports Medicine

## 2023-04-12 DIAGNOSIS — E1142 Type 2 diabetes mellitus with diabetic polyneuropathy: Secondary | ICD-10-CM

## 2023-04-12 MED ORDER — TORSEMIDE 20 MG PO TABS
20 MG | ORAL_TABLET | Freq: Every day | ORAL | 0 refills | Status: AC
Start: 2023-04-12 — End: 2023-07-11

## 2023-04-12 NOTE — Patient Instructions (Signed)
Please check your blood pressure twice in the morning and twice in the evening for one week. send me results. If blood pressure is higher than 150/90 please let me know as soon as possible.     Please avoid NSAIDs medications  - e.g. Advil, motrin, ibuprofen, aleve, diclofenac, mobic, celebrex, nabumetone.etc: they can harm your kidneys!     Labs in 2 weeks and before next appointment

## 2023-04-12 NOTE — Progress Notes (Signed)
Office : [phone]     Fax :[phone]      Nephrology progress Note        History of Present Illness:    This is a 67 y.o. female who presents to the office for evaluation of  worsening renal function .  H/o DM 2 - none   H/o HTN - now BP better after loss of weight   H/o Renal stones - none   H/o renal disease in family   H/o CAD - h/o Stent in past. H/o CHF   CHF?     H/o smoking .    Today visit 04/12/23  Pt admitted with CP at [facility] 4/24   Heart cath neg   Had AKI- resolved, Cr back to 1.1   Other lytes ok   Pt reports edema has not resolved   Pt was taken of Spironolactone and Torsemide reduce     Meds include Torsemide, Dilt, Imdur   BP at home 120/70s    Pt denies CP/palpitations/abdominal pain/N/V.   Pt denies fever/ chills  Pt denies dysuria or hematuria   Pt reports still SOB - some what better    Pt keeep low sodium     Hemoglobin A1C   Date Value Ref Range Status   03/31/2023 5.5 See comment % Final     Comment:     Comment:  Diagnosis of Diabetes: > or = 6.5%  Increased risk of diabetes (Prediabetes): 5.7-6.4%  Glycemic Control: Nonpregnant Adults: <7.0%                    Pregnant: <6.0%           Recently stopped lisinopril .   Pt is on spironolactone  , Dilt, Imdur, Torsemide     Previous labs:   Cr 1.3- 1.5 9/23  <-- 1.6 7/23<--1.4 4/23<--1. 3/23<--1.3<--2.0 (2/23) <--1.8 1/23<--1.4 8/22<--1.1 3/22<-- normal 2021 <--base line 1.0-1.1     GFR 44  Lytes ok   UPCR 0.07  UACR - neg 7/23  UA- no RBC WBC   HCV neg   CT ABD 5/23: KIDNEYS/URETERS:  Right renal cyst.  No hydronephrosis, stone, or suspicious mass . ADRENAL LESION  Mri 9/23: 1.3 cm right adrenal nodule demonstrates signal characteristics consistent with an adenoma.   S/p CTPA 2/22  Echo 7/23:    systolic function with an estimated ejection fraction of 55-60%.   No regional wall motion abnormalities are seen.   Normal diastolic function.E/e"=11.35.    No usage of NSAIDS     Past Medical History:         Diagnosis Date    AKI (acute kidney injury) (HCC) 01/26/2022    Anxiety     Asthma     Blood circulation, collateral     CAD (coronary artery disease)     CHF (congestive heart failure) (HCC)     Chronic back pain     Deep vein thrombosis     Degeneration of thoracic intervertebral disc 10/15/2017    Depression     GERD (gastroesophageal reflux disease)     NO LONGER SINCE LAP    GERD (gastroesophageal reflux disease) 01/23/2009    Hx of blood clots     Hyperlipidemia     hx; resolved with lap band    Hypertension     Idiopathic peripheral neuropathy 03/26/2017    Melanoma (HCC)     upper back    MRSA (methicillin resistant staph aureus) culture positive  10/17/2019    leg abscess    Obesity     hx of; had lap band    Obstructive sleep apnea 09/18/2015    uses bi-pap    Obstructive sleep apnea (adult) (pediatric) 09/18/2015    Pulmonary embolism (HCC)     Stenosis of right carotid artery 06/16/2021    Type 2 diabetes mellitus with diabetic polyneuropathy, without long-term current use of insulin (HCC) 01/26/2022       Past Surgical History:        Procedure Laterality Date    BACK SURGERY      neck  plates    BREAST SURGERY      left lumpectomy    CERVICAL FUSION      CESAREAN SECTION      CHOLECYSTECTOMY      COLONOSCOPY      COLONOSCOPY  04/08/2010    COLONOSCOPY N/A 04/18/2021    COLONOSCOPY DIAGNOSTIC performed by Kandice Robinsons, MD at [facility] ENDOSCOPY    CORONARY ANGIOPLASTY  03/31/2016    mid LAD 3.25x18 alpine    DIAGNOSTIC CARDIAC CATH LAB PROCEDURE  08/09/2019    normal    DILATATION, ESOPHAGUS      ENDOSCOPY, COLON, DIAGNOSTIC      HYSTERECTOMY (CERVIX STATUS UNKNOWN)      HYSTERECTOMY, VAGINAL      LAP BAND  05/01/2008    Dr. Suzi Roots    OTHER SURGICAL HISTORY      [place] Filter: curently in place    OTHER SURGICAL HISTORY  07/05/2013    lap band removal    SHOULDER SURGERY      SKIN CANCER EXCISION  12/29/2017    UPPER GASTROINTESTINAL ENDOSCOPY  05/19/2013    UPPER GASTROINTESTINAL ENDOSCOPY   07/21/2013    ESOPHAGEAL STENT PLACEMENT    UPPER GASTROINTESTINAL ENDOSCOPY N/A 07/31/2014    Esophagogastroduodenoscopy with esophageal balloon dilation    UPPER GASTROINTESTINAL ENDOSCOPY N/A 04/18/2021    EGD DILATION BALLOON performed by Kandice Robinsons, MD at [facility] ENDOSCOPY    VASCULAR SURGERY  04/2015    Donzetta Sprung, celiac artery angiogram, normal abd arteries       Current Medications:    Current Outpatient Medications   Medication Sig Dispense Refill    torsemide (DEMADEX) 20 MG tablet Take 1 tablet by mouth daily 30 tablet 3    torsemide (DEMADEX) 20 MG tablet Take 1 tablet by mouth daily 90 tablet 0    dilTIAZem (CARDIZEM CD) 180 MG extended release capsule TAKE ONE CAPSULE BY MOUTH DAILY 90 capsule 3    spironolactone (ALDACTONE) 25 MG tablet TAKE 1 TABLET BY MOUTH DAILY 30 tablet 3    fluocinonide (LIDEX) 0.05 % external solution APPLY SPARINGLY TO THE BACK OF THE SCALP DAILY AS NEEDED FOR ITCHING 60 mL 1    gabapentin (NEURONTIN) 100 MG capsule Take 1 capsule by mouth nightly.      metFORMIN (GLUCOPHAGE) 500 MG tablet Take 1 tablet by mouth 2 times daily (with meals)      isosorbide mononitrate (IMDUR) 30 MG extended release tablet Take 1 tablet by mouth daily 90 tablet 3    nitroGLYCERIN (NITROSTAT) 0.4 MG SL tablet up to max of 3 total doses. If no relief after 1 dose, call 911. 25 tablet 3    ranolazine (RANEXA) 500 MG extended release tablet TAKE ONE TABLET BY MOUTH TWICE A DAY 180 tablet 3    clobetasol (TEMOVATE) 0.05 % cream Apply to affected area twice  daily for up to 2 weeks or until improved. 60 g 1    omeprazole (PRILOSEC) 40 MG delayed release capsule TAKE ONE CAPSULE BY MOUTH DAILY 30 capsule 0    triamcinolone (KENALOG) 0.1 % cream Apply to affected area twice daily for up to 2 weeks or until improved. 15 g 2    montelukast (SINGULAIR) 10 MG tablet TAKE ONE TABLET BY MOUTH DAILY. **MUST CALL MD FOR APPOINTMENT 30 tablet 5    oxyCODONE-acetaminophen (PERCOCET) 7.5-325 MG per tablet as  needed.      rosuvastatin (CRESTOR) 20 MG tablet daily      magnesium (MAGNESIUM-OXIDE) 250 MG TABS tablet Take 350 mg by mouth daily      Coenzyme Q10 (CO Q10) 200 MG CAPS Take by mouth daily (with breakfast)      calcium citrate-vitamin D (CITRICAL + D) 315-250 MG-UNIT TABS per tablet Take 2 tablets by mouth 2 times daily      Cholecalciferol (VITAMIN D3) 50 MCG (2000 UT) CAPS Take 1 capsule by mouth daily 90 capsule 3    albuterol sulfate HFA (PROVENTIL HFA) 108 (90 Base) MCG/ACT inhaler Inhale 2 puffs into the lungs every 6 hours as needed for Wheezing 3 Inhaler 3    lidocaine (LIDODERM) 5 % Place 1 patch onto the skin daily 12 hours on, 12 hours off. (Patient taking differently: Place 1 patch onto the skin as needed 12 hours on, 12 hours off.) 30 patch 0    aspirin 81 MG EC tablet Take 1 tablet by mouth daily 30 tablet 11    Multiple Vitamins-Minerals (MULTIVITAMIN PO) Take 1 tablet by mouth daily        No current facility-administered medications for this visit.       Allergies:  Ibuprofen, Lisinopril, Seasonal, and Talwin [pentazocine]    Social History:   Social History     Socioeconomic History    Marital status: Divorced     Spouse name: [name]    Number of children: 2    Years of education: 12    Highest education level: Not on file   Occupational History    Occupation: Retired   Tobacco Use    Smoking status: Former     Current packs/day: 0.00     Average packs/day: 0.5 packs/day for 40.0 years (20.0 ttl pk-yrs)     Types: Cigarettes     Start date: 08/07/1972     Quit date: 08/07/2012     Years since quitting: 10.6    Smokeless tobacco: Never   Vaping Use    Vaping Use: Never used   Substance and Sexual Activity    Alcohol use: No     Alcohol/week: 0.0 standard drinks of alcohol    Drug use: No    Sexual activity: Not Currently     Partners: Male   Other Topics Concern    Not on file   Social History Narrative    Works as Conservation officer, nature at [employer]: 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  (05/15/2020)    Overall Financial Resource Strain (CARDIA)     Difficulty of Paying Living Expenses: Not hard at all   Food Insecurity: No Food Insecurity (03/31/2023)    Hunger Vital Sign     Worried About Running Out of Food in the Last Year: Never true     Ran Out of Food in the Last Year: Never true  Transportation Needs: No Transportation Needs (03/31/2023)    PRAPARE - Therapist, art (Medical): No     Lack of Transportation (Non-Medical): No   Physical Activity: Not on file   Stress: Not on file   Social Connections: Not on file   Intimate Partner Violence: Not on file   Housing Stability: Low Risk  (03/31/2023)    Housing Stability Vital Sign     Unable to Pay for Housing in the Last Year: No     Number of Places Lived in the Last Year: 1     Unstable Housing in the Last Year: No       Family History:   Family History   Problem Relation Age of Onset    Arthritis Mother     Cancer Mother     Depression Mother     High Blood Pressure Mother     Heart Disease Mother 50        MI     Ovarian Cancer Mother 7    Cancer Father 25        colon    Other Sister         OSA    Cancer Sister         lung liver    Other Brother         OSA    Sleep Apnea Brother     Other Brother         OSA    Sleep Apnea Brother     Heart Disease Maternal Grandmother     Early Death Paternal Grandmother     Heart Disease Paternal Grandfather     Other Daughter         OSA    Diabetes Other     High Blood Pressure Other     Obesity Other        Review of Systems:    Constitutional: No fever, no chills, no lethargy, no weakness.  HEENT:  No headache,  sore throat.  Cardiac:  No chest pain, dyspnea, orthopnea or PND.  Chest:              No cough, phlegm or wheezing.  Abdomen:  No abdominal pain, nausea or vomiting.  Neuro:                No focal weakness, abnormal movements orseizure like activity.  Skin:   No rashes, no itching.  GU:   No hematuria,, no dysuria, no flank pain.  Extremities:  No  swelling       Objective:  BP (!) 144/82   Pulse 60   Wt (!) 141.5 kg (312 lb)   BMI 51.92 kg/m     Physical Exam:  General appearance:Awake, alert, in no acute distress  Skin: warm and dry, no rash or erythema    Eyes: conjunctivae normal and sclera anicteric    ENT: :no swelling     Neck: without any JVD. No carotid bruits or thyromegaly.      Pulmonary: b/l rales     Cardiovascular:Normal S1 & S2, No S3 or  S4, No  Pericardial rub , No Murmur     Abdomen: soft nontender, bowel sounds present, no organomegaly,  No ascites      Extremities: no cyanosis, +1-2  b/l pitting edema    Labs:    CBC:   Lab Results   Component Value Date/Time    WBC 6.3 04/06/2023 04:54 AM  RBC 4.27 04/06/2023 04:54 AM    HGB 12.4 04/06/2023 04:54 AM    HCT 37.4 04/06/2023 04:54 AM    MCV 87.7 04/06/2023 04:54 AM    RDW 14.9 04/06/2023 04:54 AM    PLT 152 04/06/2023 04:54 AM    MPV 9.2 04/06/2023 04:54 AM      BMP:   Lab Results   Component Value Date/Time    NA 141 04/06/2023 04:54 AM    NA 140 04/05/2023 04:41 AM    NA 140 04/04/2023 04:53 AM    K 4.4 04/06/2023 04:54 AM    K 4.0 04/05/2023 04:41 AM    K 4.1 04/04/2023 04:53 AM    K 4.6 10/17/2019 06:07 AM    K 3.9 05/05/2017 03:21 AM    CL 106 04/06/2023 04:54 AM    CL 108 04/05/2023 04:41 AM    CL 105 04/04/2023 04:53 AM    CO2 23 04/06/2023 04:54 AM    CO2 23 04/05/2023 04:41 AM    CO2 26 04/04/2023 04:53 AM    BUN 17 04/06/2023 04:54 AM    BUN 18 04/05/2023 04:41 AM    BUN 19 04/04/2023 04:53 AM    CREATININE 1.1 04/06/2023 04:54 AM    CREATININE 1.0 04/05/2023 04:41 AM    CREATININE 1.2 04/04/2023 04:53 AM    GLUCOSE 136 04/06/2023 04:54 AM    GLUCOSE 139 04/05/2023 04:41 AM    GLUCOSE 148 04/04/2023 04:53 AM    CALCIUM 9.6 04/06/2023 04:54 AM    CALCIUM 9.7 04/05/2023 04:41 AM    CALCIUM 9.8 04/04/2023 04:53 AM      BNP:No results found for: "BNP"  PHOSPHORUS:    Lab Results   Component Value Date/Time    PHOS 3.8 08/21/2013 03:46 PM    PHOS 3.3 08/18/2013 08:13 AM    PHOS  3.5 08/17/2013 06:49 AM     MAGNESIUM:   Lab Results   Component Value Date/Time    MG 1.90 04/05/2023 04:41 AM     ALBUMIN:   No results found for: "LABALBU"    IRON:    Lab Results   Component Value Date/Time    IRON 93 03/26/2010 05:35 PM     IRON SATURATION:    No components found for: "LABIRON"    TIBC:    Lab Results   Component Value Date/Time    TIBC 397 03/26/2010 05:35 PM     FERRITIN:    Lab Results   Component Value Date/Time    FERRITIN 61.9 05/02/2009 05:40 PM     ANA:   Lab Results   Component Value Date    ANA Negative 07/26/2019       SPEP:   Lab Results   Component Value Date/Time    PROT 7.3 03/31/2011 03:07 PM    LABALPH 0.2 03/29/2017 03:33 PM    LABALPH 0.7 03/29/2017 03:33 PM    GAMGLOB 1.2 03/29/2017 03:33 PM     UPEP: No results found for: "TPU"   HEPBSAG:No results found for: "HEPBSAG"  HEPCAB:No results found for: "HEPCAB"  C3: No results found for: "C3"  C4: No results found for: "C4"  MPO ANCA: No results found for: "MPO" .  PR3 ANCA:  No results found for: "PR3"  PTH: No results found for: "PTH"    Urine Creatinine:    Lab Results   Component Value Date/Time    LABCREA 241.1 02/13/2021 03:04 PM     Urine Eosinophils: No results found  for: "UREO"  Urine Protein:  No results found for: "TPU"  Urinalysis:  U/A:   Lab Results   Component Value Date/Time    NITRU Negative 03/31/2023 04:01 PM    COLORU Yellow 03/31/2023 04:01 PM    PHUR 7.0 03/31/2023 04:01 PM    LABCAST 0-1 Hyaline 07/09/2013 02:09 AM    WBCUA 4 07/16/2014 07:26 PM    RBCUA NEGATIVE 02/13/2021 03:03 PM    MUCUS PRESENT 02/13/2021 03:03 PM    YEAST 0 11/25/2017 04:24 PM    BACTERIA 0 11/25/2017 04:24 PM    BACTERIA 2+ 07/16/2014 07:26 PM    CLARITYU Clear 03/31/2023 04:01 PM    LEUKOCYTESUR Negative 03/31/2023 04:01 PM    UROBILINOGEN 1.0 03/31/2023 04:01 PM    BILIRUBINUR Negative 03/31/2023 04:01 PM    BILIRUBINUR NEGATIVE 02/13/2021 03:03 PM    BLOODU Negative 03/31/2023 04:01 PM    GLUCOSEU Negative 03/31/2023 04:01 PM     KETUA Negative 03/31/2023 04:01 PM         Radiology:  Reviewed as available.    Assessment/Plan:    CKD stage 3B sus CRS  Creat 1.1-1.3  slightly  better - need supdated labs   No proteinuria   Torsemide 20 mg   Off  Metfrmin   FLC SPEP- normal   Plan:  No need for RAASi/ SGLT-2 for now   Recheck labs : BMP , UACR      H/o CHF but EF improved, CAD  Still edematous   Keep  torsemide --> increase to 30 mg po daily-   Monitor BP      H/o adrenal nodule . CT scan in 2020  Check CT abdomen to see the status of adrenal nodule - pt will schedule CT scan   Repeat  mri- BENIGN     HTN   BP reasonable   Spironolactone renewed   Increasing Torsemide for edema   Discussed low salt diet  No NSAIDS  Keep current meds  Recheck BP at PCP office today      Discussed with patient in detail.   Counseling done for diet and lifestyle changes.  Explained importance of compliance with medications     Shirlee More, MD  Nephrology     [facility] Office : [address], [address] , [place] [address]  [facility] Office: [address], [place], [place] [address]  [facility] Office: [address], [place] [address].  [facility] Office: [address], [place] [address]  Office : [phone]  Fax [phone]     Discussed w/ MD- Metformin discontinued

## 2023-04-13 NOTE — Progress Notes (Signed)
Formatting of this note is different from the original.  Subjective   Subjective:    Rachael Mays is a 67 year old female who presents today with   Chief Complaint   Patient presents with    Hospital F/U     HPI      Admit Date: 03/31/2023  Discharge Date: 4/30/204    Disposition: Home  Time spent arranging discharge: 31 minutes    Discharge Diagnoses:    Active Hospital Problems  Diagnosis Date Noted  Dyslipidemia [E78.5] 12/25/2016  Priority: High  Coronary artery disease due to lipid rich plaque [I25.10, I25.83] 03/31/2016  Priority: High  AKI (acute kidney injury) (HCC) [N17.9] 01/26/2022  Priority: Medium  Type 2 diabetes mellitus with diabetic polyneuropathy, without long-term current use of insulin (HCC) [E11.42] 01/26/2022  Priority: Medium  CKD (chronic kidney disease) [N18.9] 04/02/2023  Elevated troponin [R79.89] 04/02/2023  Chronic diastolic heart failure (HCC) [I50.32] 03/31/2023  Chest pain with high risk for cardiac etiology [R07.9] 08/09/2019  Essential hypertension [I10] 07/24/2013  Dizziness [R42] 03/29/2013  Morbid obesity with BMI of 50.0-59.9, adult (HCC) [E66.01, Z68.43] 02/21/2009    Condition at Discharge: Stable    Hospital Course:  Patient admitted to the hospital with chest pain underwent left heart cath which was negative for acute pathology. Patient AKI on CKD 3 secondary to contrast nephropathy treated with IV fluids and this resolved patient nausea improved with GoLytely which cleared for discharge Follow-up with cardiology as outpatient    Pt saw nephrology 5/6, sees cardiology 5/8.  Is feeling much better.  Is weighing herself daily, watching sodium and fluid.  No productive cough.    Health Maintenance   Topic Date Due    Eye Exam  Never done    PAP Screening  Never done    Colonoscopy  Never done    RSV Vaccine (60+ or Pregnant) (1 - 1-dose 60+ series) Never done    COVID-19 Vaccine (4 - 2023-24 season) 08/07/2022    Foot Exam  12/07/2022    Microalb/Creat Ratio  12/07/2022     Influenza Vaccine (Season Ended) 08/08/2023    Mammogram Screening (Annual)  08/27/2023    HB A1C  09/30/2023    CHOLESTEROL DIABETIC  03/31/2024    GFR  04/05/2024    Pneumococcal 0-64 (3 of 3 - PPSV23 or PCV20) 07/12/2024    DTap,Tdap,and Td (3 - Td or Tdap) 10/15/2029    Shingrix  Completed    Hepatitis C Screening  Completed    HPV  Aged Out    Meningococcal conjugate valent 4 (MCV4)  Aged Out    RSV Immunization (<20 months)  Aged Out     Past Medical History:   Diagnosis Date    Congestive heart failure (CHF) (HCC)     Coronary atherosclerosis     DVT (deep venous thrombosis) (HCC)     Esophageal reflux     Hyperlipidemia     Hypertension      Past Surgical History:   Procedure Laterality Date    BREAST BIOPSY      BREAST SURGERY      biopsy (negative)    CERVICAL SPINE SURGERY      CHOLECYSTECTOMY WITH/WITHOUT COMMON DUCT EXPLORATION      CORONARY ANGIOPLASTY WITH STENT PLACEMENT  03/2019    stent    HYSTERECTOMY      LAPAROSCOPIC GASTRIC BANDING      placed 2010, removed 2015       History  Alcohol Use Not Currently     Comment: on rare occasion     Social History     Tobacco Use   Smoking Status Former    Types: Cigarettes   Smokeless Tobacco Never   Tobacco Comments    quit 2013     Counseling given: Not Answered  Tobacco comments: quit 2013    Family History   Problem Relation Age of Onset    Heart Attack Mother     Ovarian cancer Mother     Hypertension Mother     High Cholesterol Mother     Thyroid Disease Sister     Diabetes Maternal Grandfather     Heart Attack Paternal Grandmother     Breast cancer Neg Hx      Allergies   Allergen Reactions    Pentazocine Other (See Comments)     dizzy     Ibuprofen Unknown     Contraindicated per Nephrologist    Lisinopril Confusion, Dizziness and Other (See Comments)    Morphine Itch    Talwin [Pentazocine Lactate] Other (See Comments)     hallucinations     Outpatient Medications Marked as Taking for the 04/13/23 encounter (Office Visit) with Dorathy Daft.,  CNP   Medication Sig Dispense Refill    dilTIAZem (CARDIZEM CD) 180 MG CP24 Take 1 capsule by mouth daily.      fluocinonide (LIDEX) 0.05 % SOLN APPLY SPARINGLY TO THE BACK OF THE SCALP DAILY AS NEEDED FOR ITCHING      gabapentin (NEURONTIN) 300 MG CAPS Take 1 capsule every day by oral route as needed for 30 days, for nerve pain.      torsemide (DEMADEX) 20 MG TABS Take 20 mg by mouth daily.      omeprazole (PRILOSEC) 40 MG CPDR Take 1 capsule by mouth daily. 90 capsule 1    liraglutide (VICTOZA) 18 mg/3 mL subcutaneous injection pen Inject 1.8 mg under the skin daily. 9 mL 1    isosorbide mononitrate (IMDUR) 30 MG TB24 Take 30 mg by mouth daily.      fluticasone (FLONASE) 50 mcg/act nasal spray Use 1 spray in each nostril daily. 16 g 0    montelukast (SINGULAIR) 10 MG TABS TAKE 1 TABLET BY MOUTH DAILY 90 tablet 1    rosuvastatin (CRESTOR) 40 MG TABS TAKE ONE TABLET BY MOUTH ONCE NIGHTLY 90 tablet 3    famotidine (PEPCID) 20 mg TABS Take 1 tablet by mouth 2 (two) times daily as needed (for Heartburn). (Patient taking differently: Take 20 mg by mouth at bedtime.) 180 tablet 1    Magnesium 250 MG TABS Take 350 mg by mouth.      ranolazine (RANEXA) 500 MG TB12 Take 1 tablet by mouth 2 (two) times daily. 60 tablet 1    oxycodone-acetaminophen (PERCOCET) 7.5-325 MG TABS Take 1 tablet by mouth every 6 (six) hours as needed (pain).      aspirin 81 MG TBEC Take 81 mg by mouth daily.      Coenzyme Q10 (COQ10) 200 MG CAPS Take 200 mg by mouth daily.      Calcium Citrate-Vitamin D 315-250 MG-UNIT TABS Take 2 tablets by mouth 2 (two) times daily.       Review of Systems   Constitutional: Negative.    HENT: Negative.     Respiratory:  Positive for shortness of breath (chronic).    Cardiovascular:  Positive for leg swelling (improved). Negative for chest pain.   Gastrointestinal:  Positive  for abdominal distention (chronic, obesity).   Neurological: Negative.      Vitals:    04/13/23 1056   BP: 148/78   Pulse: 65   Temp: 98.6 F  (37 C)   TempSrc: Temporal   SpO2: 96%   Weight: 315 lb 6.4 oz (143.1 kg)   Height: 65" (165.1 cm)     Body mass index is 52.49 kg/m.         Objective   Objective:  Physical Exam  Vitals and nursing note reviewed.   Constitutional:       General: She is not in acute distress.     Appearance: She is well-developed.   HENT:      Head: Normocephalic.   Cardiovascular:      Rate and Rhythm: Normal rate and regular rhythm.      Heart sounds: Normal heart sounds. No murmur heard.  Pulmonary:      Effort: Pulmonary effort is normal. No respiratory distress.      Breath sounds: Normal breath sounds. No rales.   Musculoskeletal:         General: Normal range of motion.      Right lower leg: 1+ Pitting Edema present.      Left lower leg: 1+ Pitting Edema present.   Skin:     General: Skin is warm and dry.   Neurological:      General: No focal deficit present.      Mental Status: She is alert.   Psychiatric:         Mood and Affect: Mood normal.     Procedures    Chronic Disease Management    Assessment & Plan:  Rachael Mays was seen today for hospital f/u.    Diagnoses and all orders for this visit:    Hospital discharge follow-up  Hospital discharge summary notes, labs and imaging results reviewed.  Has fu with appropriate specialists  Has upcoming DM appt with pcp in June    Chronic diastolic congestive heart failure [facility])  Per cardiology    Stage 3a chronic kidney disease (CKD) [facility])  Per nephrology    Considerations        No follow-ups on file.    Electronically signed by Dorathy Daft., CNP at 04/13/2023 11:21 AM EDT

## 2023-04-13 NOTE — Progress Notes (Signed)
Formatting of this note might be different from the original.  10:56 AM    Electronically signed by Moishe Spice, LPN at 86/57/8469 11:21 AM EDT

## 2023-04-14 ENCOUNTER — Encounter
Admit: 2023-04-14 | Discharge: 2023-04-14 | Payer: PRIVATE HEALTH INSURANCE | Attending: Adult Health | Primary: Sports Medicine

## 2023-04-14 DIAGNOSIS — I2583 Coronary atherosclerosis due to lipid rich plaque: Secondary | ICD-10-CM

## 2023-04-14 NOTE — Progress Notes (Signed)
[facility]     Outpatient Follow Up Note    Rachael Mays is 67 y.o. female who presents today with a history of CAD s/p PTCA LAD April '17, d-HF, DVT/PE, s/p Greenfield filter, HTN and hyperlipidemia.        Interval hx: 4/24 - 04/06/23  admitted to the hospital with chest pain underwent left heart cath which was negative for acute pathology.  If no significant epicardial CAD, consider outpatient stress PET at [facility] or UC   Patient AKI on CKD 3 secondary to contrast nephropathy treated with IV fluids and this resolved patient nausea improved with GoLytely which cleared for discharge     CHIEF COMPLAINT / HPI:  Follow Up secondary to coronary artery disease     She recalls having had chest tightness, couldn't breath and felt dizzy PTA    Subjective:   She denies significant chest pain. There is SOB described as having trouble getting a deep breath. She uses proAir but doesn't seem to help. She's SOB some walking ~ 1/2 mile and going up steps (she has sciatic and neuropathy making steps harder).  The patient denies orthopnea/PND. She sleeps with a BiPAP. The patient has swelling in her legs / feet. She actually feels that its worse being off spironolactone.  The patients weight is stable by 1-2#. She was 298# PTA and now at 312# (she started weighing on 5/2 at 310#). The patient is not experiencing palpitations or dizziness.     These symptoms are improving since the last hospitalization.   With regard to medication therapy the patient has been compliant with prescribed regimen. They have tolerated therapy to date.     Past Medical History:   Diagnosis Date    AKI (acute kidney injury) (HCC) 01/26/2022    Anxiety     Asthma     Blood circulation, collateral     CAD (coronary artery disease)     CHF (congestive heart failure) (HCC)     Chronic back pain     Deep vein thrombosis     Degeneration of thoracic intervertebral disc 10/15/2017    Depression     GERD (gastroesophageal reflux disease)     NO LONGER  SINCE LAP    GERD (gastroesophageal reflux disease) 01/23/2009    Hx of blood clots     Hyperlipidemia     hx; resolved with lap band    Hypertension     Idiopathic peripheral neuropathy 03/26/2017    Melanoma (HCC)     upper back    MRSA (methicillin resistant staph aureus) culture positive 10/17/2019    leg abscess    Obesity     hx of; had lap band    Obstructive sleep apnea 09/18/2015    uses bi-pap    Obstructive sleep apnea (adult) (pediatric) 09/18/2015    Pulmonary embolism (HCC)     Stenosis of right carotid artery 06/16/2021    Type 2 diabetes mellitus with diabetic polyneuropathy, without long-term current use of insulin (HCC) 01/26/2022     Social History:    Social History     Tobacco Use   Smoking Status Former    Current packs/day: 0.00    Average packs/day: 0.5 packs/day for 40.0 years (20.0 ttl pk-yrs)    Types: Cigarettes    Start date: 08/07/1972    Quit date: 08/07/2012    Years since quitting: 10.6   Smokeless Tobacco Never     Current Medications:  Current Outpatient Medications  Medication Sig Dispense Refill    VICTOZA 18 MG/3ML SOPN SC injection       gabapentin (NEURONTIN) 300 MG capsule Take 1 capsule by mouth nightly.      Magnesium 400 MG CAPS Take by mouth daily      torsemide (DEMADEX) 20 MG tablet Take 1 tablet by mouth daily (Patient taking differently: Take 1 tablet by mouth daily 1..5 table once a day) 30 tablet 3    dilTIAZem (CARDIZEM CD) 180 MG extended release capsule TAKE ONE CAPSULE BY MOUTH DAILY 90 capsule 3    fluocinonide (LIDEX) 0.05 % external solution APPLY SPARINGLY TO THE BACK OF THE SCALP DAILY AS NEEDED FOR ITCHING 60 mL 1    isosorbide mononitrate (IMDUR) 30 MG extended release tablet Take 1 tablet by mouth daily 90 tablet 3    ranolazine (RANEXA) 500 MG extended release tablet TAKE ONE TABLET BY MOUTH TWICE A DAY 180 tablet 3    clobetasol (TEMOVATE) 0.05 % cream Apply to affected area twice daily for up to 2 weeks or until improved. 60 g 1    omeprazole (PRILOSEC)  40 MG delayed release capsule TAKE ONE CAPSULE BY MOUTH DAILY 30 capsule 0    montelukast (SINGULAIR) 10 MG tablet TAKE ONE TABLET BY MOUTH DAILY. **MUST CALL MD FOR APPOINTMENT 30 tablet 5    oxyCODONE-acetaminophen (PERCOCET) 7.5-325 MG per tablet as needed.      rosuvastatin (CRESTOR) 20 MG tablet daily      Coenzyme Q10 (CO Q10) 200 MG CAPS Take by mouth daily (with breakfast)      calcium citrate-vitamin D (CITRICAL + D) 315-250 MG-UNIT TABS per tablet Take 2 tablets by mouth 2 times daily      Cholecalciferol (VITAMIN D3) 50 MCG (2000 UT) CAPS Take 1 capsule by mouth daily 90 capsule 3    albuterol sulfate HFA (PROVENTIL HFA) 108 (90 Base) MCG/ACT inhaler Inhale 2 puffs into the lungs every 6 hours as needed for Wheezing 3 Inhaler 3    lidocaine (LIDODERM) 5 % Place 1 patch onto the skin daily 12 hours on, 12 hours off. (Patient taking differently: Place 1 patch onto the skin as needed 12 hours on, 12 hours off.) 30 patch 0    aspirin 81 MG EC tablet Take 1 tablet by mouth daily 30 tablet 11    Multiple Vitamins-Minerals (MULTIVITAMIN PO) Take 1 tablet by mouth daily       spironolactone (ALDACTONE) 25 MG tablet TAKE 1 TABLET BY MOUTH DAILY (Patient not taking: Reported on 04/14/2023) 30 tablet 3    nitroGLYCERIN (NITROSTAT) 0.4 MG SL tablet up to max of 3 total doses. If no relief after 1 dose, call 911. (Patient not taking: Reported on 04/14/2023) 25 tablet 3    triamcinolone (KENALOG) 0.1 % cream Apply to affected area twice daily for up to 2 weeks or until improved. 15 g 2     No current facility-administered medications for this visit.     REVIEW OF SYSTEMS:    CONSTITUTIONAL: No major weight gain or loss, fatigue, weakness, night sweats or fever.  HEENT: No new vision difficulties or ringing in the ears.  RESPIRATORY: No new SOB, PND, orthopnea or cough.   CARDIOVASCULAR: See HPI  GI: No nausea, vomiting, diarrhea, constipation, abdominal pain or changes in bowel habits.  GU: No urinary frequency, urgency,  incontinence hematuria or dysuria.  SKIN: No cyanosis or skin lesions.  MUSCULOSKELETAL: No new muscle or joint pain.  NEUROLOGICAL: No  syncope or TIA-like symptoms.  PSYCHIATRIC: No anxiety, pain, insomnia or depression    Objective:   PHYSICAL EXAM:        Vitals:    04/14/23 1124 04/14/23 1200   BP: 122/60 122/68   Site: Left Upper Arm Left Upper Arm   Position: Sitting    Cuff Size: Large Adult Large Adult   Pulse: 70    SpO2: 96%    Weight: (!) 141.5 kg (312 lb)      VITALS:  BP 122/60 (Site: Left Upper Arm, Position: Sitting, Cuff Size: Large Adult)   Pulse 70   Wt (!) 141.5 kg (312 lb)   SpO2 96%   BMI 51.92 kg/m   CONSTITUTIONAL: Cooperative, no apparent distress, and appears well nourished / developed  NEUROLOGIC:  Awake and orientated to person, place and time.  PSYCH: Calm affect.  SKIN: Warm and dry.  HEENT: Sclera non-icteric, normocephalic, neck supple, no elevation of JVP, normal carotid pulses with no bruits and thyroid normal size.  LUNGS:  No increased work of breathing and clear to auscultation, no crackles or wheezing  CARDIOVASCULAR:  Regular rate 72 and rhythm with no murmurs, gallops, rubs, or abnormal heart sounds, normal PMI.The apical impulses not displaced  JVP less than 8 cm H2O  Heart tones are crisp and normal  Cervical veins are not engorged  The carotid upstroke is normal in amplitude and contour without delay or bruit  JVP is not elevated  ABDOMEN:  Normal bowel sounds, non-distended and non-tender to palpation  EXT: bil LE edema, no calf tenderness. Pulses are present bilaterally.    DATA:    Lab Results   Component Value Date    ALT 14 04/01/2023    AST 19 04/01/2023    ALKPHOS 94 04/01/2023    BILITOT 0.5 04/01/2023     Lab Results   Component Value Date    CREATININE 1.1 04/06/2023    BUN 17 04/06/2023    NA 141 04/06/2023    K 4.4 04/06/2023    CL 106 04/06/2023    CO2 23 04/06/2023     Lab Results   Component Value Date    TSH 1.37 03/31/2023    T4TOTAL 6.6 04/17/2014      Lab Results   Component Value Date    WBC 6.3 04/06/2023    HGB 12.4 04/06/2023    HCT 37.4 04/06/2023    MCV 87.7 04/06/2023    PLT 152 04/06/2023     No components found for: "CHLPL"  Lab Results   Component Value Date    TRIG 180 (H) 04/01/2023    TRIG 116 02/13/2021    TRIG 134 08/09/2019     Lab Results   Component Value Date    HDL 54 04/01/2023    HDL 57 02/18/2022    HDL 48 (L) 02/13/2021      Latest Reference Range & Units 03/31/23 16:01 03/31/23 16:45   Troponin, High Sensitivity 0 - 14 ng/L 15 (H) 16 (H)      Latest Reference Range & Units 04/02/23 05:37 04/04/23 04:53 04/06/23 04:54   Pro-BNP 0 - 124 pg/mL <36 98 135 (H)     Radiology Review:  Pertinent images / reports were reviewed as a part of this visit and reveals the following:    Echo: 04/01/23  Summary   Technically difficult study due to body habitus. Definity was administered.   Left ventricular cavity size is normal with normal left ventricular wall  thickness. Ejection fraction is visually estimated to be 55-60%. Grade I   diastolic dysfunction with normal LV filling pressures. E/e' = 11.1.   Non-coronary cusp appears restricted.   Mild aortic stenosis with a peak velocity of 2.22m/s and a mean pressure   gradient of . AVA (VTI): 2.18 cm.2.   RVSP is estimated to be 21-24 mmHG.   IVC not well visualized.   Right atrium is not well visualized, but appears normal in size.   Right ventricle is not well visualized, but appears dilated.   There appears to be some calcification of the posterior mitral valve leaflet   without evidence of stenosis.    Angiogram: 04/05/23  ANGIOGRAM/CORONARY ARTERIOGRAM:     Left main artery Bifurcates into the left anterior descending artery and left circumflex artery nml   Left anterior descending artery Gives rise to 2 diagonal arteries Mid stent patent   Diagonals   nml   Left circumflex artery Non-dominant vessel that gives rise to 2 obtuse marginal arteries nml   Obtuse Marginals   nml      Right coronary  artery Dominant vessel that gives rise to the posterior descending artery and posterolateral branch nml   Posterior descending artery Posterior lateral branch   nml  nml      Left ventricular angiogram was done in the 30 RAO projection and revealed  LVEF- 60%  LVG- nml  LVEDP- 19  Aortic pressure- 113/68 There was no gradient between the left ventricle and aorta  IMPRESSION/SUMMARY  ~Coronary Angiography w/ patent stent  ~LVG with LVEF of 60% and no regional wall motion abnormalities    Assessment:      Diagnosis Orders   1. Coronary artery disease due to lipid rich plaque   ~s/p cardiac cath : patent stent LAD  ~denies angina  ~ASA / nitrate / Ranexa / dilt        2. Primary hypertension   ~controlled   ~grade I DD on echo   ~hx of treated OSA       3. Dyslipidemia   ~controlled on crestor 20 mg daily        4. Chronic diastolic congestive heart failure (HCC)   ~ c/o bil LE edema ; positive to exam  ~off spironolactone ; continues to take torsemide with dose recently increased to 30 mg daily by NE  ~other medications with SE swelling : gabapentin / diltiazem          I had the opportunity to review the clinical symptoms and presentation of Rachael Mays.   Plan:     Hold diltiazem : possibly contributing to swelling and taking Ranexa / imdur for angina  Remains off spironolactone ; torsemide recently increased to 30 mg daily by NE  Keep appt in 5 weeks as scheduled     Overall the patient is stable from CV standpoint    I have addresed the patient's cardiac risk factors and adjusted pharmacologic treatment as needed. In addition, I have reinforced the need for patient directed risk factor modification.    Further evaluation will be based upon the patient's clinical course and testing results.    All questions and concerns were addressed to the patient. Alternatives to my treatment were discussed.      The patient is not currently smoking.     Patient is not on a beta-blocker : EF 55%  Patient is not on an  ace-i/ARB : CKD  Patient is on a statin  Antiplatelet therapy has been recommended / prescribed for this patient. Education conducted on adverse reactions including bleeding was discussed.    Daily weight, low sodium diet were discussed. Patient instructed to call the office with a weight gain: > 3 # over night or 5# in one week; swelling, SOB/orthopnea/PND    The patient verbalizes understanding not to stop medications without discussing with [place].    Discussed exercise: 30-60 minutes 7 days/week  Discussed Low saturated fat/NAS diet.     Thank you for allowing to [place] to participate in the care of Rachael Mays.    Clinton Quant, APRN    Documentation of today's visit sent to PCP

## 2023-04-14 NOTE — Patient Instructions (Signed)
Stop diltiazem since it can cause swelling    Keep appt with Dr. Berna Bue as scheduled

## 2023-04-15 ENCOUNTER — Encounter
Admit: 2023-04-15 | Discharge: 2023-04-15 | Payer: PRIVATE HEALTH INSURANCE | Attending: Dermatology | Primary: Sports Medicine

## 2023-04-15 DIAGNOSIS — L219 Seborrheic dermatitis, unspecified: Secondary | ICD-10-CM

## 2023-04-15 MED ORDER — TRIAMCINOLONE ACETONIDE 0.1 % EX CREA
0.1 | CUTANEOUS | 2 refills | Status: DC
Start: 2023-04-15 — End: 2024-06-05

## 2023-04-15 NOTE — Progress Notes (Addendum)
[facility]  Conley Canal, MD  [phone]      Rachael Mays  07-03-1956    67 y.o. female     Date of Visit: 04/15/2023    Chief Complaint: rash and moles    History of Present Illness:    1.  She returns for chronic seborrheic dermatitis of the ears - reports minimal improvement with ketoconazole 2% cream.  Complains of associated pruritus.     2.  She also presents today for evaluation of multiple moles - not aware of any changes in size, color or shape.       3.  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 Sherrie Mustache on 12/29/2017.  She denies any signs of recurrence.     4. She reports a growth on the right upper back.      Review of Systems:  Gen: Feels well, good sense of health.    Past Medical History, Family History, Surgical History, Medications and Allergies reviewed.    Past Medical History:   Diagnosis Date    AKI (acute kidney injury) (HCC) 01/26/2022    Anxiety     Asthma     Blood circulation, collateral     CAD (coronary artery disease)     CHF (congestive heart failure) (HCC)     Chronic back pain     Deep vein thrombosis     Degeneration of thoracic intervertebral disc 10/15/2017    Depression     GERD (gastroesophageal reflux disease)     NO LONGER SINCE LAP    GERD (gastroesophageal reflux disease) 01/23/2009    Hx of blood clots     Hyperlipidemia     hx; resolved with lap band    Hypertension     Idiopathic peripheral neuropathy 03/26/2017    Melanoma (HCC)     upper back    MRSA (methicillin resistant staph aureus) culture positive 10/17/2019    leg abscess    Obesity     hx of; had lap band    Obstructive sleep apnea 09/18/2015    uses bi-pap    Obstructive sleep apnea (adult) (pediatric) 09/18/2015    Pulmonary embolism (HCC)     Stenosis of right carotid artery 06/16/2021    Type 2 diabetes mellitus with diabetic polyneuropathy, without long-term current use of  insulin (HCC) 01/26/2022     Past Surgical History:   Procedure Laterality Date    BACK SURGERY      neck  plates    BREAST SURGERY      left lumpectomy    CERVICAL FUSION      CESAREAN SECTION      CHOLECYSTECTOMY      COLONOSCOPY      COLONOSCOPY  04/08/2010    COLONOSCOPY N/A 04/18/2021    COLONOSCOPY DIAGNOSTIC performed by Kandice Robinsons, MD at [facility] ENDOSCOPY    CORONARY ANGIOPLASTY  03/31/2016    mid LAD 3.25x18 alpine    DIAGNOSTIC CARDIAC CATH LAB PROCEDURE  08/09/2019    normal    DILATATION, ESOPHAGUS      ENDOSCOPY, COLON, DIAGNOSTIC      HYSTERECTOMY (CERVIX STATUS UNKNOWN)      HYSTERECTOMY, VAGINAL      LAP BAND  05/01/2008    Dr. Suzi Roots    OTHER SURGICAL HISTORY      Greenfield Filter: curently in  place    OTHER SURGICAL HISTORY  07/05/2013    lap band removal    SHOULDER SURGERY      SKIN CANCER EXCISION  12/29/2017    UPPER GASTROINTESTINAL ENDOSCOPY  05/19/2013    UPPER GASTROINTESTINAL ENDOSCOPY  07/21/2013    ESOPHAGEAL STENT PLACEMENT    UPPER GASTROINTESTINAL ENDOSCOPY N/A 07/31/2014    Esophagogastroduodenoscopy with esophageal balloon dilation    UPPER GASTROINTESTINAL ENDOSCOPY N/A 04/18/2021    EGD DILATION BALLOON performed by Kandice Robinsons, MD at [facility] ENDOSCOPY    VASCULAR SURGERY  04/2015    Donzetta Sprung, celiac artery angiogram, normal abd arteries       Allergies   Allergen Reactions    Ibuprofen      Due to lap band erosion    Lisinopril      Memory loss     Seasonal     Talwin [Pentazocine] Other (See Comments)     dizzy     Outpatient Medications Marked as Taking for the 04/15/23 encounter (Office Visit) with Ronelle Nigh, MD   Medication Sig Dispense Refill    triamcinolone (KENALOG) 0.1 % cream Apply to the affected areas of the ears twice daily for up to 2 weeks or until improved. 15 g 2    VICTOZA 18 MG/3ML SOPN SC injection       gabapentin (NEURONTIN) 300 MG capsule Take 1 capsule by mouth nightly.      Magnesium 400 MG CAPS Take by mouth daily      torsemide (DEMADEX)  20 MG tablet Take 1 tablet by mouth daily (Patient taking differently: Take 1 tablet by mouth daily 1..5 table once a day) 30 tablet 3    dilTIAZem (CARDIZEM CD) 180 MG extended release capsule TAKE ONE CAPSULE BY MOUTH DAILY 90 capsule 3    spironolactone (ALDACTONE) 25 MG tablet TAKE 1 TABLET BY MOUTH DAILY 30 tablet 3    fluocinonide (LIDEX) 0.05 % external solution APPLY SPARINGLY TO THE BACK OF THE SCALP DAILY AS NEEDED FOR ITCHING 60 mL 1    isosorbide mononitrate (IMDUR) 30 MG extended release tablet Take 1 tablet by mouth daily 90 tablet 3    nitroGLYCERIN (NITROSTAT) 0.4 MG SL tablet up to max of 3 total doses. If no relief after 1 dose, call 911. 25 tablet 3    ranolazine (RANEXA) 500 MG extended release tablet TAKE ONE TABLET BY MOUTH TWICE A DAY 180 tablet 3    clobetasol (TEMOVATE) 0.05 % cream Apply to affected area twice daily for up to 2 weeks or until improved. 60 g 1    omeprazole (PRILOSEC) 40 MG delayed release capsule TAKE ONE CAPSULE BY MOUTH DAILY 30 capsule 0    triamcinolone (KENALOG) 0.1 % cream Apply to affected area twice daily for up to 2 weeks or until improved. 15 g 2    montelukast (SINGULAIR) 10 MG tablet TAKE ONE TABLET BY MOUTH DAILY. **MUST CALL MD FOR APPOINTMENT 30 tablet 5    oxyCODONE-acetaminophen (PERCOCET) 7.5-325 MG per tablet as needed.      rosuvastatin (CRESTOR) 20 MG tablet daily      Coenzyme Q10 (CO Q10) 200 MG CAPS Take by mouth daily (with breakfast)      calcium citrate-vitamin D (CITRICAL + D) 315-250 MG-UNIT TABS per tablet Take 2 tablets by mouth 2 times daily      Cholecalciferol (VITAMIN D3) 50 MCG (2000 UT) CAPS Take 1 capsule by mouth daily 90 capsule 3  albuterol sulfate HFA (PROVENTIL HFA) 108 (90 Base) MCG/ACT inhaler Inhale 2 puffs into the lungs every 6 hours as needed for Wheezing 3 Inhaler 3    lidocaine (LIDODERM) 5 % Place 1 patch onto the skin daily 12 hours on, 12 hours off. (Patient taking differently: Place 1 patch onto the skin as needed 12  hours on, 12 hours off.) 30 patch 0    aspirin 81 MG EC tablet Take 1 tablet by mouth daily 30 tablet 11    Multiple Vitamins-Minerals (MULTIVITAMIN PO) Take 1 tablet by mouth daily            Physical Examination       Full skin exam performed except for skin under the underwear.     Well appearing.    1. [place] of the ears with scaly pink patches.     2.  Trunk and extremities with multiple well defined round to oval smooth brown macules and papules.     3.  Central upper back with a linear surgical scar.     4.  Right upper back with a stuck on appearing round brown papule.       Assessment and Plan     1. Seborrheic dermatitis of the ears - chronic, minimally improvement with ketoconazole cream    Triamcinolone 0.1% cream twice daily as needed.      2. Multiple nevi - benign appearing    Sun protective behaviors, including use of at least SPF 30 sunscreen, and self skin examinations were encouraged.  Call for any new or concerning lesions.       3. History of stage IA melanoma of the upper back - no signs of recurrence.     Sun protective behaviors, including use of at least SPF 30 sunscreen, and self skin examinations were encouraged.  Call for any new or concerning lesions.       4.  Seborrheic keratosis    Reassurance.          Return in about 6 months (around 10/16/2023).    --Ronelle Nigh, MD

## 2023-04-28 LAB — CBC WITH AUTO DIFFERENTIAL
Abs, Immature Cells: 0 10*3/uL (ref 0.0–0.1)
Basophils %: 0.6 %
Basophils Absolute: 0 10*3/uL (ref 0.0–0.1)
Eosinophils Absolute: 0.4 10*3/uL (ref 0.0–0.5)
Eosinophils: 5.5 %
Hematocrit: 41.9 % (ref 34.0–45.0)
Hemoglobin: 12.7 g/dL (ref 11.2–15.7)
Immature Granulocytes %: 0.3 %
Lymphocytes Absolute: 2.5 10*3/uL (ref 1.2–3.9)
Lymphocytes: 39.1 %
MCH: 28.7 pg (ref 26.0–34.0)
MCHC: 30.3 g/dL — ABNORMAL LOW (ref 30.7–35.5)
MCV: 94.8 fL (ref 80.0–100.0)
MPV: 11.3 fL (ref 8.8–12.5)
Monocytes %: 13 %
Monocytes Absolute: 0.8 10*3/uL (ref 0.3–0.9)
Neutrophils Absolute: 2.6 10*3/uL (ref 1.6–6.1)
Neutrophils: 41.5 %
Platelets: 174 10*3/uL (ref 155–369)
RBC: 4.42 10*6/uL (ref 3.90–5.20)
RDW: 14.7 % (ref <=14.9)
WBC: 6.4 10*3/uL (ref 3.7–10.3)

## 2023-04-28 LAB — PROTEIN / CREATININE RATIO, URINE
Creatinine, Ur: 174.9 mg/dL
PROTEIN/CREAT RATIO URINE RAN: 0.05 Ratio (ref <0.16)
Protein, Urine, Random: 9.4 mg/dL (ref 0.0–9.9)

## 2023-04-28 LAB — ALBUMIN, RANDOM URINE
Creatinine, Ur: 189.2 mg/dL
Microalb, Ur: <12 mg/L

## 2023-04-28 LAB — BASIC METABOLIC PANEL
Anion Gap: 13 mmol/L (ref 7–16)
BUN: 21 mg/dL (ref 8–23)
CO2: 26 mmol/L (ref 22–29)
Calcium: 9.3 mg/dL (ref 8.8–10.4)
Chloride: 104 mEq/L (ref 98–107)
Creatinine: 1.34 mg/dL — ABNORMAL HIGH (ref 0.51–1.30)
Glucose: 128 mg/dL — ABNORMAL HIGH (ref 70–99)
Potassium: 4.5 mEq/L (ref 3.5–5.0)
Sodium: 143 mEq/L (ref 136–145)
eGFR (CKD-EPI): 44 mL/min/{1.73_m2} — ABNORMAL LOW (ref >=60)

## 2023-05-12 ENCOUNTER — Encounter

## 2023-05-12 NOTE — Assessment & Plan Note (Signed)
Associated Problem(s): Essential hypertension  Formatting of this note is different from the original.  Hypertension  - Today's BP:   Vitals:    05/11/23 1733   BP: 128/70   Pulse:    Temp:    SpO2:      - Patient's blood pressure is currently well controlled.  - Current meds:     - Continue  Imdur 30 mg daily.     - Continue  diltiazem 180 mg daily  - Spironolactone 25 mg daily was stopped when she was in the hospital.  - If patient is able, record daily blood pressures at home prior to medications and bring log to next office visit.   - Discussed sodium restriction, maintaining ideal body weight and regular exercise program as physiologic means to achieve blood pressure control. The patient will strive towards this. It may be appropriate to lower BP with medications, while observing for therapeutic effect and if appropriate later, can discontinue medications if physiologic methods appear to be effective. The patient indicates understanding of these issues and agrees with the plan  ESTIMATED GFR   Date Value Ref Range Status   05/07/2023 42 (L) >=60 mL/min/1.73 m2 Final     Comment:     (NOTE)  Estimated GFR was calculated using the CKD-EPIcr (2021) equation  refit without race. The equation is recommended by the [employer] [facility].  Tested at: [employer], [address] [employer] [address]    60/45/4098 44 (L) >=60 mL/min/1.73 m2 Final     Comment:     (NOTE)  Estimated GFR was calculated using the CKD-EPIcr (2021) equation  refit without race. The equation is recommended by the [employer] [facility].  Tested at: [employer], [address] [employer] [address]    78/29/5621 44 (L) >59 mL/min/1.73 m2 Final     Comment:     Estimated GFR was calculated using the CKD-EPI cr (2021) equation refit without race.  The equation is recommended by the [employer] [facility]  [facility].  Tested at [facility] [address] [phone]      Electronically signed by Marshell Levan at 05/12/2023  7:32 PM EDT

## 2023-05-12 NOTE — Assessment & Plan Note (Signed)
Associated Problem(s): Stage 3a chronic kidney disease (CKD) (HCC)  Formatting of this note is different from the original.  - This is managed by nephrologist, Dr. Doristine Bosworth.  - She noted worsening in her renal function, which I think is secondary to the increase in Torsemide dose.  - She is currently taking Torsemide 30 mg daily.  - I recommended decreasing the 10 mg daily and reaching out to Dr. Doristine Bosworth and discussing his recommendations moving forward.  - She has a delicate fluid balance due to the history of CHF, and also CKD.  - Continue management as per nephrology  ESTIMATED GFR   Date Value Ref Range Status   05/07/2023 42 (L) >=60 mL/min/1.73 m2 Final     Comment:     (NOTE)  Estimated GFR was calculated using the CKD-EPIcr (2021) equation  refit without race. The equation is recommended by the [employer] [facility].  Tested at: [employer], [address] [employer] [address] [employer] [address]    60/45/4098 44 (L) >=60 mL/min/1.73 m2 Final     Comment:     (NOTE)  Estimated GFR was calculated using the CKD-EPIcr (2021) equation  refit without race. The equation is recommended by the [employer] [facility].  Tested at: [employer], [address] [employer] [address]    78/29/5621 44 (L) >59 mL/min/1.73 m2 Final     Comment:     Estimated GFR was calculated using the CKD-EPI cr (2021) equation refit without race.  The equation is recommended by the [employer] [facility].  Tested at [facility] [address] [phone]      Electronically signed by Marshell Levan at 05/12/2023  7:30 PM EDT

## 2023-05-12 NOTE — Assessment & Plan Note (Signed)
Associated Problem(s): Type 2 diabetes mellitus with diabetic polyneuropathy, without long-term current use of insulin (HCC)  Formatting of this note is different from the original.  Diabetes Management:   HEMOGLOBIN A1C   Date Value Ref Range Status   05/07/2023 5.4 4.2 - 5.6 % Final   03/31/2023 5.5 See comment % Final     Comment:     Comment:  Diagnosis of Diabetes: > or = 6.5%  Increased risk of diabetes (Prediabetes): 5.7-6.4%  Glycemic Control: Nonpregnant Adults: <7.0%                    Pregnant: <6.0%      06/25/2022 5.7 (H) 4.2 - 5.6 % Final   07/09/2021 7.2 (H) 4.2 - 5.6 % Final     HEMOGLOBIN A1C EXTERNAL LAB   Date Value Ref Range Status   01/04/2023 5.7 % Final     - Most current Hemoglobin A1c demonstrates improving diabetes control. Therefore, I have recommended the following medication management.   - Current Medications:      - Stop  Victoza 1.8 mg daily, patient notes that she cannot tolerate this.     - Start  Trulicity 0.75 mg weekly and then increase to Trulicity 1.5 mg weekly.    10 year ASCVD Risk Score: 10.3 %  Currently prescribed statin medication: Yes  Currently prescribed ACE/ARB: no   ASA 81 mg daily: Yes  During today's appointment I discussed and educated the patient and what constitutes a low carbohydrate diet and the health benefits of adapting such a diet. They will begin implementing these ideas and we will monitor their weight and co-morbidities.  I discussed with the patient the importance of exercise, with a goal of at least 30 minutes of exercise, at least 5 times weekly.  Extensive conversation today regarding the importance of checking her feet both visually and tactilely for any signs of skin breakdown. If she notices anything concerning. Senya will reach out to [place] to be seen/for further guidance.   Smoking Cessation  reports that she has quit smoking. Her smoking use included cigarettes. She has never used smokeless tobacco.  Last Eye Exam: No results found for:  "RETINOPATHY"  Discussed the importance of having annual screening for diabetic retinopathy by an eye care professional.    Electronically signed by Marshell Levan at 05/12/2023  7:33 PM EDT

## 2023-05-12 NOTE — Assessment & Plan Note (Signed)
Associated Problem(s): Binge eating disorder  Formatting of this note might be different from the original.  Rachael Mays notes that she is having difficulty controlling her severe ravenous-type hunger while snacking.  - Recommended establishment with weight management and Dr. Gillian Shields.  - Discussed potential management of binge eating disorder.  - She has agreed to this and is interested in the referral.    Electronically signed by Marshell Levan at 05/12/2023  7:30 PM EDT

## 2023-05-16 ENCOUNTER — Encounter

## 2023-05-18 MED ORDER — ISOSORBIDE MONONITRATE ER 30 MG PO TB24
30 MG | ORAL_TABLET | Freq: Every day | ORAL | 3 refills | Status: DC
Start: 2023-05-18 — End: 2024-04-25

## 2023-05-18 NOTE — Telephone Encounter (Signed)
Received refill request for Imdur from [facility] pharmacy.    Last ov:04/14/2023 NPTS    Last Refill:06/03/2022    Next appointment:06/04/2023 [facility]

## 2023-05-18 NOTE — Telephone Encounter (Signed)
Medication Refill    Medication needing refilled:  isosorbide mononitrate      Dosage of the medication:  30mg     How are you taking this medication (QD, BID, TID, QID, PRN):  Take 1 tablet by mouth daily     30 or 90 day supply called in:   90    When will you run out of your medication:    Which Pharmacy are we sending the medication to?:    [id number]  [address]  [facility] [address]  Phone: [phone]  Fax: [phone]     NOTE:  Pt is requesting to a refill.  Please advise

## 2023-05-20 NOTE — Telephone Encounter (Signed)
From: Gaetana Michaelis  To: Dr. Court Joy  Sent: 05/20/2023 3:01 PM EDT  Subject: Steroids Question    Dr Berna Bue  I just came back from seeing my family dr for my degenerative disc disease. He wants to know if you would be agreeable to episodic use of oral/injection corticosteroids. I know you had told me no injection but can I take the oral steroids?  Thank you  Jocelyn Lamer

## 2023-05-21 MED ORDER — TORSEMIDE 20 MG PO TABS
20 | ORAL_TABLET | Freq: Every day | ORAL | 0 refills | Status: DC
Start: 2023-05-21 — End: 2023-07-02

## 2023-05-21 NOTE — Telephone Encounter (Signed)
Last OV: 04/12/23  Follow up scheduled: 07/12/23  Pt requesting refill on torsemide 20 mg tabs. [facility] and is hoping they can deliver today.

## 2023-05-28 ENCOUNTER — Encounter

## 2023-05-28 MED ORDER — RANOLAZINE ER 500 MG PO TB12
500 MG | ORAL_TABLET | Freq: Two times a day (BID) | ORAL | 3 refills | Status: DC
Start: 2023-05-28 — End: 2024-05-20

## 2023-05-28 NOTE — Telephone Encounter (Signed)
Requested Prescriptions     Pending Prescriptions Disp Refills    ranolazine (RANEXA) 500 MG extended release tablet 180 tablet 3     Sig: Take 1 tablet by mouth 2 times daily      [facility] [id number]      Last OV:  04/14/2023 NPTS    Next OV: 06/04/2023 [facility]    Last EKG: 03/31/2023     Last Filled: 05/21/2022 [facility]

## 2023-06-03 NOTE — Patient Instructions (Addendum)
Everything looks good today  Follow up in 1 year with an echo and carotid duplex

## 2023-06-04 ENCOUNTER — Ambulatory Visit
Admit: 2023-06-04 | Discharge: 2023-06-04 | Payer: PRIVATE HEALTH INSURANCE | Attending: Cardiovascular Disease | Primary: Sports Medicine

## 2023-06-04 VITALS — BP 142/70 | HR 69 | Ht 65.0 in | Wt 320.0 lb

## 2023-06-04 DIAGNOSIS — I2583 Coronary atherosclerosis due to lipid rich plaque: Principal | ICD-10-CM

## 2023-06-04 NOTE — Progress Notes (Signed)
[facility]    06/04/2023    Rachael Mays (DOB:  1956-08-13) is a 67 y.o. female is here for follow up and management of CAD and modifiable risk factors.    Referring Provider: Maximino Sarin, DO    HISTORY:  Rachael Mays has a history of coronary artery disease s/p DES of LAD (03/2016), hypertension, hyperlipidemia, diastolic heart failure, and carotid artery stenosis.  She has chronic chest pain and shortness of breath that did not resolve after coronary intervention in 03/2016.  RHC (2017) . Additional history includes DVT/PE,  s/p lap band surgery in 2014. S/p dilation of esophagus 04/2021    02/18/21 LHC showed patent proximal LAD stent with mid 30% stenosis in some views and FFR 0.89.  Medical management was recommended.  LHC 11/22 mild nonobstructive cad with mild myocardial bridging. 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     Hospitalization 04/01/23 for trouble breathing. LHC done with patent stents.     Today she she is feeling good. Occasional palpitations at rest, nothing that has been bothering her. No chest pain or shortness of breath. Her only concern is losing weight. She has a h/o lap band which had complications and she has not been able to walk daily.       REVIEW OF SYSTEMS:  A complete review of systems was reviewed and is negative except as noted in the history of present illness.    Prior to Visit Medications    Medication Sig Taking? Authorizing Provider   ranolazine (RANEXA) 500 MG extended release tablet Take 1 tablet by mouth 2 times daily Yes Court Joy, MD   torsemide (DEMADEX) 20 MG tablet Take 1 tablet by mouth daily Please deliver today if possible per patient request Yes Orlev-Shitrit, Mechele Dawley, MD   isosorbide mononitrate (IMDUR) 30 MG extended release tablet Take 1 tablet by mouth daily Yes Court Joy, MD   triamcinolone (KENALOG) 0.1 % cream Apply to the affected areas of the ears twice daily for up to 2 weeks or until improved. Yes Ronelle Nigh, MD   gabapentin (NEURONTIN) 300 MG capsule Take 1 capsule by mouth nightly. Yes [provider]   Magnesium 400 MG CAPS Take by mouth daily Yes [provider]   dilTIAZem (CARDIZEM CD) 180 MG extended release capsule TAKE ONE CAPSULE BY MOUTH DAILY Yes Court Joy, MD   fluocinonide (LIDEX) 0.05 % external solution APPLY SPARINGLY TO THE BACK OF THE SCALP DAILY AS NEEDED FOR ITCHING Yes Ronelle Nigh, MD   nitroGLYCERIN (NITROSTAT) 0.4 MG SL tablet up to max of 3 total doses. If no relief after 1 dose, call 911. Yes Court Joy, MD   clobetasol (TEMOVATE) 0.05 % cream Apply to affected area twice daily for up to 2 weeks or until improved. Yes Ronelle Nigh, MD   omeprazole (PRILOSEC) 40 MG delayed release capsule TAKE ONE CAPSULE BY MOUTH DAILY Yes Wojciechowski, Hale Bogus, APRN - NP   triamcinolone (KENALOG) 0.1 % cream Apply to affected area twice daily for up to 2 weeks or until improved. Yes Ronelle Nigh, MD   montelukast (SINGULAIR) 10 MG tablet TAKE ONE TABLET BY MOUTH DAILY. **MUST CALL MD FOR APPOINTMENT Yes Wojciechowski, Hale Bogus, APRN - NP   oxyCODONE-acetaminophen (PERCOCET) 7.5-325 MG per tablet as needed. Yes [provider]   rosuvastatin (CRESTOR) 20 MG tablet daily Yes [provider]   Coenzyme Q10 (CO Q10) 200 MG  CAPS Take by mouth daily (with breakfast) Yes [provider]   calcium citrate-vitamin D (CITRICAL + D) 315-250 MG-UNIT TABS per tablet Take 2 tablets by mouth 2 times daily Yes [provider]   Cholecalciferol (VITAMIN D3) 50 MCG (2000 UT) CAPS Take 1 capsule by mouth daily Yes Wojciechowski, Hale Bogus, APRN - NP   albuterol sulfate HFA (PROVENTIL HFA) 108 (90 Base) MCG/ACT inhaler Inhale 2 puffs into the lungs every 6 hours as needed for Wheezing Yes Ulitzsch, Kearney Hard, APRN - CNP   lidocaine (LIDODERM) 5 % Place 1 patch onto the skin daily 12 hours on, 12 hours off.  Patient taking  differently: Place 1 patch onto the skin as needed 12 hours on, 12 hours off. Yes Dalene Carrow, PA-C   aspirin 81 MG EC tablet Take 1 tablet by mouth daily Yes Nadyne Coombes, Dutch Gray, MD   Multiple Vitamins-Minerals (MULTIVITAMIN PO) Take 1 tablet by mouth daily  Yes [provider]        Allergies   Allergen Reactions    Ibuprofen      Due to lap band erosion    Lisinopril      Memory loss     Seasonal     Talwin [Pentazocine] Other (See Comments)     dizzy       Past Medical History:   Diagnosis Date    AKI (acute kidney injury) (HCC) 01/26/2022    Anxiety     Asthma     Blood circulation, collateral     CAD (coronary artery disease)     CHF (congestive heart failure) (HCC)     Chronic back pain     Deep vein thrombosis     Degeneration of thoracic intervertebral disc 10/15/2017    Depression     GERD (gastroesophageal reflux disease)     NO LONGER SINCE LAP    GERD (gastroesophageal reflux disease) 01/23/2009    Hx of blood clots     Hyperlipidemia     hx; resolved with lap band    Hypertension     Idiopathic peripheral neuropathy 03/26/2017    Melanoma (HCC)     upper back    MRSA (methicillin resistant staph aureus) culture positive 10/17/2019    leg abscess    Obesity     hx of; had lap band    Obstructive sleep apnea 09/18/2015    uses bi-pap    Obstructive sleep apnea (adult) (pediatric) 09/18/2015    Pulmonary embolism (HCC)     Stenosis of right carotid artery 06/16/2021    Type 2 diabetes mellitus with diabetic polyneuropathy, without long-term current use of insulin (HCC) 01/26/2022       Past Surgical History:   Procedure Laterality Date    BACK SURGERY      neck  plates    BREAST SURGERY      left lumpectomy    CERVICAL FUSION      CESAREAN SECTION      CHOLECYSTECTOMY      COLONOSCOPY      COLONOSCOPY  04/08/2010    COLONOSCOPY N/A 04/18/2021    COLONOSCOPY DIAGNOSTIC performed by Kandice Robinsons, MD at [facility] END[facility]COPY    CORONARY ANGIOPLASTY  03/31/2016    mid LAD 3.25x18 alpine     DIAGNOSTIC CARDIAC CATH LAB PROCEDURE  08/09/2019    normal    DILATATION, ESOPHAGUS      ENDOSCOPY, COLON, DIAGNOSTIC  HYSTERECTOMY (CERVIX STATUS UNKNOWN)      HYSTERECTOMY, VAGINAL      LAP BAND  05/01/2008    Dr. Suzi Roots    OTHER SURGICAL HISTORY      [place] Filter: curently in place    OTHER SURGICAL HISTORY  07/05/2013    lap band removal    SHOULDER SURGERY      SKIN CANCER EXCISION  12/29/2017    UPPER GASTROINTESTINAL ENDOSCOPY  05/19/2013    UPPER GASTROINTESTINAL ENDOSCOPY  07/21/2013    ESOPHAGEAL STENT PLACEMENT    UPPER GASTROINTESTINAL ENDOSCOPY N/A 07/31/2014    Esophagogastroduodenoscopy with esophageal balloon dilation    UPPER GASTROINTESTINAL ENDOSCOPY N/A 04/18/2021    EGD DILATION BALLOON performed by Kandice Robinsons, MD at [facility] ENDOSCOPY    VASCULAR SURGERY  04/2015    Donzetta Sprung, celiac artery angiogram, normal abd arteries       Social History     Tobacco Use    Smoking status: Former     Current packs/day: 0.00     Average packs/day: 0.5 packs/day for 40.0 years (20.0 ttl pk-yrs)     Types: Cigarettes     Start date: 08/07/1972     Quit date: 08/07/2012     Years since quitting: 10.8    Smokeless tobacco: Never   Substance Use Topics    Alcohol use: No        Family History   Problem Relation Age of Onset    Arthritis Mother     Cancer Mother     Depression Mother     High Blood Pressure Mother     Heart Disease Mother 31        MI     Ovarian Cancer Mother 31    Cancer Father 3        colon    Other Sister         OSA    Cancer Sister         lung liver    Other Brother         OSA    Sleep Apnea Brother     Other Brother         OSA    Sleep Apnea Brother     Heart Disease Maternal Grandmother     Early Death Paternal Grandmother     Heart Disease Paternal Grandfather     Other Daughter         OSA    Diabetes Other     High Blood Pressure Other     Obesity Other        PHYSICAL EXAMINATION:  Vitals:    06/04/23 1358 06/04/23 1400   BP: (!) 140/80 (!) 142/70   Site: Right Upper  Arm Right Upper Arm   Position: Sitting Sitting   Cuff Size: Large Adult Large Adult   Pulse: 69    SpO2: 99%    Weight: (!) 145.2 kg (320 lb)    Height: 1.651 m (5\' 5" )        Estimated body mass index is 53.25 kg/m as calculated from the following:    Height as of this encounter: 1.651 m (5\' 5" ).    Weight as of this encounter: 145.2 kg (320 lb).    Physical Exam  Constitutional:       Appearance: She is well-developed. She is not diaphoretic.   HENT:      Head: Normocephalic and atraumatic.   Eyes:  General: No scleral icterus.     Extraocular Movements: Extraocular movements intact.      Conjunctiva/sclera: Conjunctivae normal.   Neck:      Vascular: No JVD.   Cardiovascular:      Rate and Rhythm: Normal rate and regular rhythm.      Heart sounds: Murmur heard.      No friction rub. No gallop.      Comments: 2/6 systolic murmur RUSB  Pulmonary:      Effort: Pulmonary effort is normal. No respiratory distress.      Breath sounds: Normal breath sounds. No wheezing or rales.   Abdominal:      General: Bowel sounds are normal.      Palpations: Abdomen is soft.      Tenderness: There is no abdominal tenderness.   Musculoskeletal:         General: Normal range of motion.      Cervical back: Normal range of motion and neck supple.      Right lower leg: Edema present.      Left lower leg: Edema present.      Comments: +1 bilaterally     Skin:     General: Skin is warm and dry.      Findings: No rash.   Neurological:      General: No focal deficit present.      Mental Status: She is alert and oriented to person, place, and time.         I have reviewed all pertinent lab results and diagnostic testing.        LABS:  CBC:   Lab Results   Component Value Date/Time    WBC 6.4 04/28/2023 10:03 AM    RBC 4.42 04/28/2023 10:03 AM    HGB 12.7 04/28/2023 10:03 AM    HCT 41.9 04/28/2023 10:03 AM    MCV 94.8 04/28/2023 10:03 AM    RDW 14.7 04/28/2023 10:03 AM    PLT 174 04/28/2023 10:03 AM     CMP:   Lab Results   Component  Value Date/Time    NA 143 04/28/2023 10:03 AM    K 4.5 04/28/2023 10:03 AM    K 4.6 10/17/2019 06:07 AM    CL 104 04/28/2023 10:03 AM    CO2 26 04/28/2023 10:03 AM    BUN 21 04/28/2023 10:03 AM    CREATININE 1.34 04/28/2023 10:03 AM    GFRAA >60 10/18/2019 05:42 AM    GFRAA >60 03/16/2012 03:42 PM    AGRATIO 1.3 04/01/2023 05:29 AM    LABGLOM 56 04/06/2023 04:54 AM    GLUCOSE 128 04/28/2023 10:03 AM    PROT 7.3 03/31/2011 03:07 PM    CALCIUM 9.3 04/28/2023 10:03 AM    BILITOT 0.5 04/01/2023 05:29 AM    ALKPHOS 94 04/01/2023 05:29 AM    AST 19 04/01/2023 05:29 AM    ALT 14 04/01/2023 05:29 AM     Lab Results   Component Value Date/Time    CHOL 137 04/01/2023 05:29 AM    TRIG 180 04/01/2023 05:29 AM    HDL 54 04/01/2023 05:29 AM    HDL 52 03/31/2011 03:07 PM       PT/INR: No results found for: "PTINR"     Cardiac cath 03/31/23 Colon Branch)  ANGIOGRAM/CORONARY ARTERIOGRAM:     Left main artery Bifurcates into the left anterior descending artery and left circumflex artery nml   Left anterior descending artery Gives rise to 2 diagonal arteries Mid  stent patent   Diagonals   nml   Left circumflex artery Non-dominant vessel that gives rise to 2 obtuse marginal arteries nml   Obtuse Marginals   nml      Right coronary artery Dominant vessel that gives rise to the posterior descending artery and posterolateral branch nml   Posterior descending artery Posterior lateral branch   nml  nml         LEFT VENTRICULOGRAM:  Left ventricular angiogram was done in the 30 RAO projection and revealed  LVEF- 60%  LVG- nml  LVEDP- 19  Aortic pressure- 113/68 There was no gradient between the left ventricle and aorta     MEDICATION AND PROCEDURAL RECONCILIATION:  An independent trained observer pushed medications at my direction. We monitored the patient's level of consciousness and vital signs/physiologic status throughout the procedure duration (see start and stop times below).  Sedation: 5 mg Versed, 200 mcg Fentanyl  Sedation start:  1118  Sedation stop: 1137  Contrast: 50 cc of Isovue   Flouro Time: 1.7 minutes of fluoroscopy     IMPRESSION/SUMMARY  ~Coronary Angiography w/ patent stent  ~LVG with LVEF of 60% and no regional wall motion abnormalities     RECOMMENDATION:  ~Aggressive medical treatment and risk factor modification  ~1. Medications reviewed, no changes at this time.  2. Post cath IVF. Bedrest.  4. Patient has been advised on the importance of regular exercise of at least 20-30 minutes daily alternating with aerobic and isometric activities.   5. Patient counseled about and offered assistance for smoking cessation   6. No indication for cardiac rehab  7. Follow up in 2 months with cardiology    Cardiac cath 10/14/21 Berna Bue)  Anatomy:   LM-normal  LAD-patent proximal stent with 10% in-stent restenosis, 30% mid with mild myocardial bridging  Cx-10% to 20% proximal  OM1-normal  RCA-normal  LVEF-70%     Impression:  1.  Mild nonobstructive CAD.  2.  Hyperdynamic LV systolic function.  3.  High resting heart rate.     Plan:  1.  Recommend addition of diltiazem CD 180 mg daily.  To accommodate addition of diltiazem reduce Ranexa to 500 mg twice daily.  Also would decrease lisinopril to 20 mg daily to allow for enough blood pressure to accommodate addition of diltiazem.  Diltiazem may be helpful should there be a component of microvascular angina.    Echo 03/31/23   Summary   Technically difficult study due to body habitus. Definity was administered.   Left ventricular cavity size is normal with normal left ventricular wall   thickness. Ejection fraction is visually estimated to be 55-60%. Grade I   diastolic dysfunction with normal LV filling pressures. E/e' = 11.1.   Non-coronary cusp appears restricted.   Mild aortic stenosis with a peak velocity of 2.33m/s and a mean pressure   gradient of . AVA (VTI): 2.18 cm.2.   RVSP is estimated to be 21-24 mmHG.   IVC not well visualized.   Right atrium is not well visualized, but appears  normal in size.   Right ventricle is not well visualized, but appears dilated.   There appears to be some calcification of the posterior mitral valve leaflet   without evidence of stenosis.    Echo 06/26/22   Summary   -Technically limited secondary to body habitus, Definity not available at   this facility.   -Normal left ventricle size, mild concentric wall thickness, and normal   systolic function with an  estimated ejection fraction of 55-60%.   -No regional wall motion abnormalities are seen.   -Normal diastolic function.E/e"=11.35.   -Mild aortic stenosis with a peak velocity of 2.9m/s and a mean pressure   gradient of .   -Estimated pulmonary artery systolic pressure is normal at 23 mmHg assuming   a right atrial pressure of 3 mmHg.    Carotid duplex 11/2021  There is <50% stenosis of the bilateral internal carotid arteries.   There is more than 50 % stenosis of the external carotid arteries   bilaterally. Vertebral flow are antegrade bilaterally.    Cardiac cath 02/18/21 ([facility])   Angiographic Findings:  Right dominant system   Left Main:  Normal     Left Anterior Descending:  Patent proximal stent. Mid 30% stenosis,   irregular appearing in some views. FFR 0.89.   Circumflex:  No obstructive disease   Right Coronary:  No obstructive disease   Left Ventriculogram:  LVEF 55-60%     Hemodynamics (mm Hg):   Left Ventricular Pressure:  102/49   Central Aortic Pressure:  125/0, 6     Conclusions:   -No obstructive CAD   Recommendations:   -Follow-up with GI   -Continue medical therapy     Lexiscan myoview 01/29/21 ([facility])  Interpetation Summary:   1. No no change of baseline chest pain after regadenoson injection.   2. No evidence of stress-induced ischemia on EKG.   3. There is evidence of ischemia in the territory subtended by the left anterior descending coronary artery.   4. Normal LV systolic function.        Carotid duplex 11/12/20: Serena Colonel)      o 50-69% stenosis of the right internal  carotid artery based on velocity criteria.      o No hemodynamically significant lesions are identified on the left internal or common carotid(s).      o There is antegrade flow demonstrated in the right vertebral artery.      o The left vertebral artery could not be visualized.     Echocardiogram 05/27/20: ([facility])     The left ventricle is normal in size.   There is normal left ventricular wall thickness.   Left ventricular systolic function is normal. (ZOXW>/=96%)   Overall left ventricular ejection fraction is estimated to be 60-65%.   Normal Diastolic Function.   No significant valvular abnormalities identified.         Procedure:          LHC 08/09/19  Indication:          Unstable angina     Procedure Details:  Consent Access Bleed R Sedat Start Stop Versed Fentanyl Contrast Fluoro EBL Comp Spec   Yes RRA low MCSFC 1457 1522 4 150 51 1.9 <20 None None   *Ultrasound Note: Ultrasound guidance used to determine aforementioned artery patency, size (>60mm), anatomic variations and ideal puncture location.  Real-time ultrasound utilized concurrent with vascular needle entry into the artery.  Image(s) permanently recorded and reported in the patient chart.  *Sedation Note: MCSFC = minimal conscious sedation for comfort     Findings:  Artery Findings/Result   LM Normal   LAD Widely patent stent, 50% ostial D1 jailed by stent (no change)   Cx Normal   RI NA   RCA Normal   LVEDP 6   LVG 60%      Intervention:         None     Post Cath Dx:  Nonobstructive CAD, patent stent LAD    Cardiac Cath 05/07/17:  Anatomy:   LM-normal  LAD-normal, widely patent stent mid  Cx-normal  OM1- normal  RCA-normal, right dominant  RPDA- normal  LVEF- 60%     Impression:  1.  Normal coronary artery anatomy.  2.  Widely patent mid LAD stent.  3.  Normal LV systolic function.  4.  False positive stress test.  Likely breast artifact.     Plan:  1.  Medical management.  2.  She needs workup of noncardiac causes of chest pain.  She now has  to stress test with similar abnormalities most consistent with breast artifact.  The majority of her coronary angiograms have demonstrated normal anatomy except for the one that had a 70% mid LAD stenosis which was stented.  Her chest pain has atypical features.  Recommend rheumatology and possibly orthopedic evaluation.    Echo 08/05/16 ([name] [place], [place])  LV with normal systolic function,  EF 55-60%  Mild MR with multiple jets  Very mild AS  Mild TR    Cardiac Cath 07/17/16:  Anatomy:   LM-normal  LAD-widely patent stent otherwise normal  Cx-normal, dominant  OM1- normal  RCA-normal  LVEF- 70%, LVEDP 15 mmHg     Impression:  1.  Widely patent LAD stent otherwise normal coronary arteries  2.  Hyperdynamic LV systolic function  3.  Non-cardiac chest pain     Plan:  1.  Medical management    Cardiac Cath 03/31/16:  Anatomy:   LM-normal   LAD-70% mid with FFR 0.77  Cx-normal, codominant  OM1- normal  RCA-normal  RPDA- normal  LVEF- 70%, LVEDP 19   PCI: LAD 70% to 0% with 3.25 mm x 18 mm Xience Alpine drug-eluting stent     Hemodynamics:  RA-15/12 mean 9  RV- 60/0, 9  PAWP-20/26 mean 19  PA- 60/12 mean 33      Impression:  1.  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  2.  Hyperdynamic LV systolic function.  3.  Mildly decompensated diastolic CHF.  4.  Moderate pulmonary hypertension.     Cardiac cath 09/23/15: normal coronary arteries      ASSESSMENT/PLAN:  1. Coronary artery disease due to lipid rich plaque  - 03/31/16 LHC - mid LAD 70% stenosis with FFR 0.77.  S/p DES x 1.   - 07/17/16 LHC - patent stent,   - 05/07/17 LHC - patent stent,  (false positive nuclear stress test  - 08/09/19 LHC - widely patent LAD stent, 50% ostial D1 jailed by stent (no change)  - 01/29/21 SPECT - ischemia in LAD territory  - 02/18/21 LHC - right dominant system.  Patent proximal LAD stent, mid 30% stenosis, irregular appearing in some views. FFR 0.89.  Medical management.  Started on Ranexa  -  10/14/21 LHC - mild nonobstructive CAD, microvascular cad, cont medical management, add diltiazem  - 02/2023 LHC - patent stebts   -  Ranexa, ASA, statin, diltiazem, Imdur    Plan:  Stable. Continue current medication regimen.      2. Chronic diastolic congestive heart failure (HCC)  3. Pulmonary hypertension  - 03/2016 Right heart cath - mildly decompensated heart failure with moderate pulmonary hypertension (PAWP-20/26 mean 19 ; PA- 60/12 mean 33)   - 04/06/16 Echo - EF 65%, mild CLVH, RVSP 42 mmHg  - 05/27/20 Echo - EF 60-65%, normal diastolic function, function is normal.   - 06/2022 Echo -  EF 55-60%  - torsemide    Plan : Stable.  Continue current medical regimen.    4. Essential hypertension  BP (!) 142/70 (Site: Right Upper Arm, Position: Sitting, Cuff Size: Large Adult)   Pulse 69   Ht 1.651 m (5\' 5" )   Wt (!) 145.2 kg (320 lb)   SpO2 99%   BMI 53.25 kg/m    - torsemide     Plan : Stable.  Continue current medical regimen.    5. Dyslipidemia  02/17/21 - TC- 141, TG- 127, HDL- 47, LDL- 69.  AST- 47, ALT- 47  07/09/21 TC 150 TG 142 HDL 46 LDL 79. LFT normal.    02/18/22 TC 154 TG 100 HDL 57 LDL 77. LFT normal.    04/01/23 TC 137 TG 180 HDL 54 LDL 47. LFT normal.    - crestor 20 mg , CoQ 10     Plan :  Well controlled     6. Stenosis of right carotid artery  - 11/12/20 carotid duplex ([facility]) - RICA with 50-69% stenosis of RICA, no significant plaque in Rt common carotid artery.    No hemodynamically significant stenosis of left ICA or common carotid artery.   - 11/2021  <50 % bilaterally     Plan : Carotid duplex next year     7. Weight management   Working with weight management team     Plan :  Stable.  Continue current medical regimen.  RTC in 1 year.    Return in about 1 year (around 06/03/2024).    An  electronic signature was used to authenticate this note.    Sheria Lang. Berna Bue, MD, [facility], [facility]    Scribe's Attestation: This note was scribed in the presence of Dr. Wonda Cerise, MD by Leanor Rubenstein, RN.      Physician Attestation: The scribe's documentation has been prepared under my direction and personally reviewed by me in its entirety.  I confirm that the note above accurately reflects all work, treatment, procedures, and medical decision making performed by me.

## 2023-06-08 ENCOUNTER — Encounter

## 2023-06-09 LAB — BASIC METABOLIC PANEL
Anion Gap: 12 (ref 3–16)
BUN: 13 mg/dL (ref 7–20)
CO2: 28 mmol/L (ref 21–32)
Calcium: 9.8 mg/dL (ref 8.3–10.6)
Chloride: 102 mmol/L (ref 99–110)
Creatinine: 1.2 mg/dL (ref 0.6–1.2)
Est, Glom Filt Rate: 50 — AB (ref >60)
Glucose: 101 mg/dL — ABNORMAL HIGH (ref 70–99)
Potassium: 4.4 mmol/L (ref 3.5–5.1)
Sodium: 142 mmol/L (ref 136–145)

## 2023-06-18 NOTE — Telephone Encounter (Signed)
Called and spoke with patient and advised that refills were sent to the pharmacy. Patient states that she called them and was told there was no refills remaining.     Received refill request for isosorbide mononitrate (IMDUR) 30 MG  from [facility] pharmacy.     Last OV: 06/04/2023 [facility]    Next OV:     Last Labs:     Last Filled: 05/18/2023 [facility]

## 2023-07-02 MED ORDER — TORSEMIDE 20 MG PO TABS
20 | ORAL_TABLET | Freq: Every day | ORAL | 0 refills | Status: DC
Start: 2023-07-02 — End: 2023-07-28

## 2023-07-02 NOTE — Progress Notes (Signed)
Pt is c/o leg swelling   Not true SOB  Plan:  Increase Torsemide to 20 mg bid   See me the coming Tuesday

## 2023-07-02 NOTE — Telephone Encounter (Signed)
Dr. Denny Levy with pt. Sched for Next week per Dr. Doristine Bosworth

## 2023-07-02 NOTE — Telephone Encounter (Signed)
I received a phone call from patient who states that she has severe swelling in her legs and feet. Patient states she is also having a hard time breathing, and has gained 11 pounds in 1 week. Patient currently taking Torsemide 20 MG daily. Please advise.

## 2023-07-06 ENCOUNTER — Ambulatory Visit
Admit: 2023-07-06 | Discharge: 2023-07-06 | Payer: PRIVATE HEALTH INSURANCE | Attending: Internal Medicine | Primary: Sports Medicine

## 2023-07-06 DIAGNOSIS — N1831 Chronic kidney disease, stage 3a: Secondary | ICD-10-CM

## 2023-07-06 MED ORDER — SPIRONOLACTONE 25 MG PO TABS
25 | ORAL_TABLET | Freq: Every day | ORAL | 3 refills | Status: AC
Start: 2023-07-06 — End: ?

## 2023-07-06 NOTE — Patient Instructions (Signed)
Renew Spironolactone  BMP 2 weeks   Please check your blood pressure twice in the morning and twice in the evening for one week. send me results. If blood pressure is higher than 150/90 please let me know as soon as possible.   Torsemide 20 mg twice a day

## 2023-07-06 NOTE — Progress Notes (Signed)
Office : [phone]     Fax :[phone]      Nephrology progress Note        History of Present Illness:    This is a 67 y.o. female who presents to the office for evaluation of  worsening renal function .  H/o DM 2 - none   H/o HTN - now BP better after loss of weight   H/o Renal stones - none   H/o renal disease in family   H/o CAD - h/o Stent in past. H/o CHF   CHF?     H/o smoking .    Today visit 07/06/23  Swelling improved once Torsemide increased to 20 mg bid   Cr 1.2 7/24: base line   Lytes good  GFR 50   Ca 9.8   UACR UPCR wnl   Heart cath neg   BP here 140/74   Meds include Torsemide, Dilt, Imdur   BP at home 120/70s    Pt has DOE and nausea   Pt denies CP/palpitations/abdominal pain//V.   Pt denies fever/ chills  Pt denies dysuria or hematuria       Hemoglobin A1C   Date Value Ref Range Status   03/31/2023 5.5 See comment % Final     Comment:     Comment:  Diagnosis of Diabetes: > or = 6.5%  Increased risk of diabetes (Prediabetes): 5.7-6.4%  Glycemic Control: Nonpregnant Adults: <7.0%                    Pregnant: <6.0%           Recently stopped lisinopril .   Pt is on spironolactone  , Dilt, Imdur, Torsemide     Previous labs:   Cr 1.3- 1.5 9/23  <-- 1.6 7/23<--1.4 4/23<--1. 3/23<--1.3<--2.0 (2/23) <--1.8 1/23<--1.4 8/22<--1.1 3/22<-- normal 2021 <--base line 1.0-1.1     GFR 44  Lytes ok   UPCR 0.07  UACR - neg 7/23  UA- no RBC WBC   HCV neg   CT ABD 5/23: KIDNEYS/URETERS:  Right renal cyst.  No hydronephrosis, stone, or suspicious mass . ADRENAL LESION  Mri 9/23: 1.3 cm right adrenal nodule demonstrates signal characteristics consistent with an adenoma.   S/p CTPA 2/22  Echo 7/23:    systolic function with an estimated ejection fraction of 55-60%.   No regional wall motion abnormalities are seen.   Normal diastolic function.E/e"=11.35.    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    Depression     GERD (gastroesophageal reflux disease)     NO LONGER SINCE LAP    GERD (gastroesophageal reflux disease) 01/23/2009    Hx of blood clots     Hyperlipidemia     hx; resolved with lap band    Hypertension     Idiopathic peripheral neuropathy 03/26/2017    Melanoma (HCC)     upper back    MRSA (methicillin resistant staph aureus) culture positive 10/17/2019    leg abscess    Obesity  hx of; had lap band    Obstructive sleep apnea 09/18/2015    uses bi-pap    Obstructive sleep apnea (adult) (pediatric) 09/18/2015    Pulmonary embolism (HCC)     Stenosis of right carotid artery 06/16/2021    Type 2 diabetes mellitus with diabetic polyneuropathy, without long-term current use of insulin (HCC) 01/26/2022       Past Surgical History:        Procedure Laterality Date    BACK SURGERY      neck  plates    BREAST SURGERY      left lumpectomy    CERVICAL FUSION      CESAREAN SECTION      CHOLECYSTECTOMY      COLONOSCOPY      COLONOSCOPY  04/08/2010    COLONOSCOPY N/A 04/18/2021    COLONOSCOPY DIAGNOSTIC performed by Kandice Robinsons, MD at [facility] ENDOSCOPY    CORONARY ANGIOPLASTY  03/31/2016    mid LAD 3.25x18 alpine    DIAGNOSTIC CARDIAC CATH LAB PROCEDURE  08/09/2019    normal    DILATATION, ESOPHAGUS      ENDOSCOPY, COLON, DIAGNOSTIC      HYSTERECTOMY (CERVIX STATUS UNKNOWN)      HYSTERECTOMY, VAGINAL      LAP BAND  05/01/2008    Dr. Suzi Roots    OTHER SURGICAL HISTORY      [place] Filter: curently in place    OTHER SURGICAL HISTORY  07/05/2013    lap band removal    SHOULDER SURGERY      SKIN CANCER EXCISION  12/29/2017    UPPER GASTROINTESTINAL ENDOSCOPY  05/19/2013    UPPER GASTROINTESTINAL ENDOSCOPY  07/21/2013    ESOPHAGEAL STENT PLACEMENT    UPPER GASTROINTESTINAL ENDOSCOPY  N/A 07/31/2014    Esophagogastroduodenoscopy with esophageal balloon dilation    UPPER GASTROINTESTINAL ENDOSCOPY N/A 04/18/2021    EGD DILATION BALLOON performed by Kandice Robinsons, MD at [facility] ENDOSCOPY    VASCULAR SURGERY  04/2015    Donzetta Sprung, celiac artery angiogram, normal abd arteries       Current Medications:    Current Outpatient Medications   Medication Sig Dispense Refill    torsemide (DEMADEX) 20 MG tablet Take 1 tablet by mouth daily Please deliver today if possible per patient request 90 tablet 0    ranolazine (RANEXA) 500 MG extended release tablet Take 1 tablet by mouth 2 times daily 180 tablet 3    isosorbide mononitrate (IMDUR) 30 MG extended release tablet Take 1 tablet by mouth daily 90 tablet 3    triamcinolone (KENALOG) 0.1 % cream Apply to the affected areas of the ears twice daily for up to 2 weeks or until improved. 15 g 2    gabapentin (NEURONTIN) 300 MG capsule Take 1 capsule by mouth nightly.      Magnesium 400 MG CAPS Take by mouth daily      dilTIAZem (CARDIZEM CD) 180 MG extended release capsule TAKE ONE CAPSULE BY MOUTH DAILY 90 capsule 3    fluocinonide (LIDEX) 0.05 % external solution APPLY SPARINGLY TO THE BACK OF THE SCALP DAILY AS NEEDED FOR ITCHING 60 mL 1    nitroGLYCERIN (NITROSTAT) 0.4 MG SL tablet up to max of 3 total doses. If no relief after 1 dose, call 911. 25 tablet 3    clobetasol (TEMOVATE) 0.05 % cream Apply to affected area twice daily for up to 2 weeks or until improved. 60 g 1    omeprazole (PRILOSEC) 40  MG delayed release capsule TAKE ONE CAPSULE BY MOUTH DAILY 30 capsule 0    triamcinolone (KENALOG) 0.1 % cream Apply to affected area twice daily for up to 2 weeks or until improved. 15 g 2    montelukast (SINGULAIR) 10 MG tablet TAKE ONE TABLET BY MOUTH DAILY. **MUST CALL MD FOR APPOINTMENT 30 tablet 5    oxyCODONE-acetaminophen (PERCOCET) 7.5-325 MG per tablet as needed.      rosuvastatin (CRESTOR) 20 MG tablet daily      Coenzyme Q10 (CO Q10) 200 MG CAPS Take  by mouth daily (with breakfast)      calcium citrate-vitamin D (CITRICAL + D) 315-250 MG-UNIT TABS per tablet Take 2 tablets by mouth 2 times daily      Cholecalciferol (VITAMIN D3) 50 MCG (2000 UT) CAPS Take 1 capsule by mouth daily 90 capsule 3    albuterol sulfate HFA (PROVENTIL HFA) 108 (90 Base) MCG/ACT inhaler Inhale 2 puffs into the lungs every 6 hours as needed for Wheezing 3 Inhaler 3    lidocaine (LIDODERM) 5 % Place 1 patch onto the skin daily 12 hours on, 12 hours off. (Patient taking differently: Place 1 patch onto the skin as needed 12 hours on, 12 hours off.) 30 patch 0    aspirin 81 MG EC tablet Take 1 tablet by mouth daily 30 tablet 11    Multiple Vitamins-Minerals (MULTIVITAMIN PO) Take 1 tablet by mouth daily        No current facility-administered medications for this visit.       Allergies:  Ibuprofen, Lisinopril, Seasonal, and Talwin [pentazocine]    Social History:   Social History     Socioeconomic History    Marital status: Divorced     Spouse name: [name]    Number of children: 2    Years of education: 12    Highest education level: Not on file   Occupational History    Occupation: Retired   Tobacco Use    Smoking status: Former     Current packs/day: 0.00     Average packs/day: 0.5 packs/day for 40.0 years (20.0 ttl pk-yrs)     Types: Cigarettes     Start date: 08/07/1972     Quit date: 08/07/2012     Years since quitting: 10.9    Smokeless tobacco: Never   Vaping Use    Vaping Use: Never used   Substance and Sexual Activity    Alcohol use: No    Drug use: No    Sexual activity: Not Currently     Partners: Male   Other Topics Concern    Not on file   Social History Narrative    Works as Conservation officer, nature at [employer]: Low Risk  (05/15/2020)    Overall Financial Resource Strain (CARDIA)     Difficulty of Paying Living Expenses: Not hard at all   Food Insecurity: No Food Insecurity (03/31/2023)    Hunger Vital Sign     Worried About Running  Out of Food in the Last Year: Never true     Ran Out of Food in the Last Year: Never true   Transportation Needs: No Transportation Needs (03/31/2023)    PRAPARE - Therapist, art (Medical): No     Lack of Transportation (Non-Medical): No   Physical Activity: Not on file   Stress: Not on file  Social Connections: Not on file   Intimate Partner Violence: Not on file   Housing Stability: Low Risk  (03/31/2023)    Housing Stability Vital Sign     Unable to Pay for Housing in the Last Year: No     Number of Places Lived in the Last Year: 1     Unstable Housing in the Last Year: No       Family History:   Family History   Problem Relation Age of Onset    Arthritis Mother     Cancer Mother     Depression Mother     High Blood Pressure Mother     Heart Disease Mother 25        MI     Ovarian Cancer Mother 32    Cancer Father 72        colon    Other Sister         OSA    Cancer Sister         lung liver    Other Brother         OSA    Sleep Apnea Brother     Other Brother         OSA    Sleep Apnea Brother     Heart Disease Maternal Grandmother     Early Death Paternal Grandmother     Heart Disease Paternal Grandfather     Other Daughter         OSA    Diabetes Other     High Blood Pressure Other     Obesity Other        Review of Systems:    Constitutional: No fever, no chills, no lethargy, no weakness.  HEENT:  No headache,  sore throat.  Cardiac:  No chest pain, dyspnea, orthopnea or PND.  Chest:              No cough, phlegm or wheezing.  Abdomen:  No abdominal pain, nausea or vomiting.  Neuro:                No focal weakness, abnormal movements orseizure like activity.  Skin:   No rashes, no itching.  GU:   No hematuria,, no dysuria, no flank pain.  Extremities:  +2 swelling       Objective:  BP (!) 140/74   Pulse 66   Wt (!) 145.2 kg (320 lb)   BMI 53.25 kg/m     Physical Exam:  General appearance:Awake, alert, in no acute distress  Skin: warm and dry, no rash or erythema    Eyes:  conjunctivae normal and sclera anicteric    ENT: :no swelling     Neck: without any JVD. No carotid bruits or thyromegaly.      Pulmonary: b/l rales     Cardiovascular:Normal S1 & S2, No S3 or  S4, No  Pericardial rub , No Murmur     Abdomen: soft nontender, bowel sounds present, no organomegaly,  No ascites      Extremities: no cyanosis, +1-2  b/l pitting edema    Labs:    CBC:   Lab Results   Component Value Date/Time    WBC 6.4 04/28/2023 10:03 AM    RBC 4.42 04/28/2023 10:03 AM    HGB 12.7 04/28/2023 10:03 AM    HCT 41.9 04/28/2023 10:03 AM    MCV 94.8 04/28/2023 10:03 AM    RDW 14.7 04/28/2023 10:03 AM    PLT 174  04/28/2023 10:03 AM    MPV 11.3 04/28/2023 10:03 AM      BMP:   Lab Results   Component Value Date/Time    NA 142 06/08/2023 03:13 PM    NA 143 04/28/2023 10:03 AM    NA 141 04/06/2023 04:54 AM    K 4.4 06/08/2023 03:13 PM    K 4.5 04/28/2023 10:03 AM    K 4.4 04/06/2023 04:54 AM    K 4.6 10/17/2019 06:07 AM    K 3.9 05/05/2017 03:21 AM    CL 102 06/08/2023 03:13 PM    CL 104 04/28/2023 10:03 AM    CL 106 04/06/2023 04:54 AM    CO2 28 06/08/2023 03:13 PM    CO2 26 04/28/2023 10:03 AM    CO2 23 04/06/2023 04:54 AM    BUN 13 06/08/2023 03:13 PM    BUN 21 04/28/2023 10:03 AM    BUN 17 04/06/2023 04:54 AM    CREATININE 1.2 06/08/2023 03:13 PM    CREATININE 1.34 04/28/2023 10:03 AM    CREATININE 1.1 04/06/2023 04:54 AM    GLUCOSE 101 06/08/2023 03:13 PM    GLUCOSE 128 04/28/2023 10:03 AM    GLUCOSE 136 04/06/2023 04:54 AM    CALCIUM 9.8 06/08/2023 03:13 PM    CALCIUM 9.3 04/28/2023 10:03 AM    CALCIUM 9.6 04/06/2023 04:54 AM      BNP:No results found for: "BNP"  PHOSPHORUS:    Lab Results   Component Value Date/Time    PHOS 3.8 08/21/2013 03:46 PM    PHOS 3.3 08/18/2013 08:13 AM    PHOS 3.5 08/17/2013 06:49 AM     MAGNESIUM:   Lab Results   Component Value Date/Time    MG 1.90 04/05/2023 04:41 AM     ALBUMIN:   No results found for: "LABALBU"    IRON:    Lab Results   Component Value Date/Time    IRON 93  03/26/2010 05:35 PM     IRON SATURATION:    No components found for: "LABIRON"    TIBC:    Lab Results   Component Value Date/Time    TIBC 397 03/26/2010 05:35 PM     FERRITIN:    Lab Results   Component Value Date/Time    FERRITIN 61.9 05/02/2009 05:40 PM     ANA:   Lab Results   Component Value Date    ANA Negative 07/26/2019       SPEP:   Lab Results   Component Value Date/Time    GAMGLOB 1.2 03/29/2017 03:33 PM     UPEP: No results found for: "TPU"   HEPBSAG:No results found for: "HEPBSAG"  HEPCAB:No results found for: "HEPCAB"  C3: No results found for: "C3"  C4: No results found for: "C4"  MPO ANCA: No results found for: "MPO" .  PR3 ANCA:  No results found for: "PR3"  PTH: No results found for: "PTH"    Urine Creatinine:    No results found for: "LABCREA"    Urine Eosinophils: No results found for: "UREO"  Urine Protein:  No results found for: "TPU"  Urinalysis:  U/A:   Lab Results   Component Value Date/Time    NITRU Negative 03/31/2023 04:01 PM    COLORU Yellow 03/31/2023 04:01 PM    PHUR 7.0 03/31/2023 04:01 PM    LABCAST 0-1 Hyaline 07/09/2013 02:09 AM    WBCUA 4 07/16/2014 07:26 PM    RBCUA NEGATIVE 02/13/2021 03:03 PM    MUCUS  PRESENT 02/13/2021 03:03 PM    YEAST 0 11/25/2017 04:24 PM    BACTERIA 0 11/25/2017 04:24 PM    BACTERIA 2+ 07/16/2014 07:26 PM    CLARITYU Clear 03/31/2023 04:01 PM    LEUKOCYTESUR Negative 03/31/2023 04:01 PM    UROBILINOGEN 1.0 03/31/2023 04:01 PM    BILIRUBINUR Negative 03/31/2023 04:01 PM    BILIRUBINUR NEGATIVE 02/13/2021 03:03 PM    BLOODU Negative 03/31/2023 04:01 PM    GLUCOSEU Negative 03/31/2023 04:01 PM    KETUA Negative 03/31/2023 04:01 PM         Radiology:  Reviewed as available.    Assessment/Plan:    CKD stage 3B sus CRS  Creat 1.1-1.3  slightly  better 1.2 edema+  No proteinuria   Torsemide 20 mg bid   Off  Metfrmin   FLC SPEP- normal   Plan:  No need for RAASi/ SGLT-2 for now   Recheck labs : BMP , UACR before next visit   Pt will take Torsemide bid and not in  the mronign to help with meals sodium        H/o CHF but EF improved, CAD  Still edematous   Keep  torsemide --> increase to 20  mg bid po daily-   Monitor BP   Low sodium diet  No NSAIDS     H/o adrenal nodule . CT scan in 2020  Check CT abdomen to see the status of adrenal nodule - pt will schedule CT scan   Repeat  mri- BENIGN     HTN   BP reasonable - border line   Spironolactone renewed   IncreasedTorsemide for edema   Discussed low salt diet  No NSAIDS  Keep current meds  Recheck BP at PCP office today      Discussed with patient in detail.   Counseling done for diet and lifestyle changes.  Explained importance of compliance with medications     Shirlee More, MD  Nephrology     [facility] Office : [address] , [place] [address]  [facility] Office: [address], [place], [place] [address]  [facility] Office: [address] [place] [address].  [facility] Office: [address] [place] [address]  Office : [phone]  Fax :[phone]     Discussed w/ MD- Metformin discontinued

## 2023-07-12 ENCOUNTER — Encounter: Payer: PRIVATE HEALTH INSURANCE | Primary: Sports Medicine

## 2023-07-15 ENCOUNTER — Encounter

## 2023-07-23 NOTE — Progress Notes (Signed)
Formatting of this note is different from the original.  Date: 07/23/2023    Name: SOUMAYA GOTTSHALL  Diagnosis:   1. Radiculopathy of lumbar region      Referring Provider: Maximino Sarin, DO  Visit #: 7    S: Pt notes that she's ready to continue to work.      Functional deficit(s): Unable to walk around the house.     Pain: 4/10    Increased pain after prior session.     O:  Objective Eval    Physical Therapy Treatment on this date as follows:      Med[employer] Access Code  [email or web address] printed       Therapeutic Exercise Details           SUPER SET: X3  See below:  (This would address LE's, UE's, spine, cardio and glutes in a concise format)    Edu and pt is agreeable.  She notes low strength never recovered since hospitalization about 7 years ago.    HEP: T-band fwd elevation HEP: T-band row HEP: EOB squat HEP: hip abd standing x15ea     Leg press 30# x25   CC: rows 30# x25   CC: LPD 30# x25     Seated hip abd X15 vs blue         Neuromuscular Re Education Details     Edu on general exercise (e.g. pt's Margie Billet low impact work out) as appropriate to add to PT regime in addition to [employer] exercise Rx. Inquiry about what type of exercises this includes.  Attempted to draw elements of this program into PT.     Edu on asymptomatic incidence of disc degen and height loss on scan. Prev     PT witnesses pt's disclosure of stressors including relational stress, financial stress, and lack of control of home environment.    PNE that pain may be heightened by these elements.  Assertion that tissue damage is unlikely in the disclosed scenarios.  Invitation to observe the body for tension, to celebrate it when noticed and to combine breath with relaxation briefly.  (I.e. "Shoulders down and breathe") PNE booster:    Stress as a pain modifier.  Examples from pt's own scenario offered.    Pt noted to be non-defensive and receptive.    PT connects inflammation and negative expectations.    PT connects clearing of inflammation with  movement.     Other Interventions Details   Leg pull Relief in the past.    NV train pt's fiancee in this technique if he can attend    Reclined HP to anterior knees. 10'           A: Timed-codes: TE 25 min.    Untimed-codes: NA    Today's treatment consisted of all interventions and activities documented with the note above    Patient progressing toward established goals     P: Plan:   Rachael Mays will continue with current plan of care and established home exercise program as documented within the note above  until next visit which will focus on advancing therapeutic techniques and functional exercises as able to progress toward previous level of function.      Treatment Minutes as follows:    Timed-code Treatment: 25 (min.) Total Treatment Time:  25 (min.)         Electronically signed by Reeves Forth, PT at 07/23/2023  4:46 PM EDT

## 2023-07-28 MED ORDER — TORSEMIDE 20 MG PO TABS
20 | ORAL_TABLET | Freq: Every day | ORAL | 0 refills | Status: AC
Start: 2023-07-28 — End: ?

## 2023-07-28 MED ORDER — TORSEMIDE 20 MG PO TABS
20 | ORAL_TABLET | Freq: Every day | ORAL | 0 refills | Status: DC
Start: 2023-07-28 — End: 2023-07-28

## 2023-08-04 NOTE — Telephone Encounter (Signed)
 Pt notified. She will try meds for a couple days and if no better go to ER. Pt sched for 08/17/2023 in [facility] (next available) pt reluctant to drive to another clinic. Pt to call or send message with FU in a few days.

## 2023-08-06 MED ORDER — SPIRONOLACTONE 25 MG PO TABS
25 | ORAL_TABLET | Freq: Every day | ORAL | 3 refills | Status: DC
Start: 2023-08-06 — End: 2023-12-06

## 2023-08-06 MED ORDER — TORSEMIDE 20 MG PO TABS
20 | ORAL_TABLET | Freq: Three times a day (TID) | ORAL | 0 refills | Status: DC
Start: 2023-08-06 — End: 2023-08-17

## 2023-08-06 NOTE — Telephone Encounter (Signed)
 Pt needed new rx with increased dose

## 2023-08-12 NOTE — Progress Notes (Signed)
 Formatting of this note is different from the original.  Date: 08/12/2023    Name: Rachael Mays  Diagnosis:   1. Radiculopathy of lumbar region      Referring Provider: Sebastian Berg, DO  Visit #: 8    S: Pt reports that her left knee is hurting.     Functional deficit(s):  Unable to walk around the house.     Pain: 4/10    Non-compliance with HEP     O:  Objective Eval    Physical Therapy Treatment on this date as follows:      Medbridge Access Code  [email or web address] printed       Therapeutic Exercise Details           See below:  (This would address LE's, UE's, spine, cardio and glutes in a concise format)    Edu and pt is agreeable.  She notes low strength never recovered since hospitalization about 7 years ago.    HEP: T-band fwd elevation HEP: T-band row x15   HEP: hip abd standing x15ea       CC: rows 30# x25   CC: LPD 30# x25     Seated hip abd X15 vs blue    Seated dorsiflexion x15ea    Seated heel slide on towel x15ea     Neuromuscular Re Education Details     Edu on general exercise (e.g. pt's Charlie Shed low impact work out) as appropriate to add to PT regime in addition to [employer] exercise Rx.    Inquiry about what type of exercises this includes.  Attempted to draw elements of this program into PT. Prev     Edu on asymptomatic incidence of disc degen and height loss on scan. Prev       Other Interventions Details   Leg pull  Reclined HP to anterior knees. 10'           A: Timed-codes: TE 25 min.    Untimed-codes: NA    Today's treatment consisted of all interventions and activities documented with the note above    Patient progressing toward established goals     P: Plan:   [name] will continue with current plan of care and established home exercise program as documented within the note above  until next visit which will focus on advancing therapeutic techniques and functional exercises as able to progress toward previous level of function.      Treatment Minutes as follows:    Timed-code  Treatment: 25 (min.) Total Treatment Time:  25 (min.)         Electronically signed by Debara Heinz, PT at 08/12/2023  6:11 PM EDT

## 2023-08-17 ENCOUNTER — Encounter

## 2023-08-17 ENCOUNTER — Ambulatory Visit: Admit: 2023-08-17 | Discharge: 2023-08-17 | Payer: MEDICARE | Attending: Internal Medicine | Primary: Sports Medicine

## 2023-08-17 VITALS — BP 130/61 | HR 83 | Wt 322.8 lb

## 2023-08-17 DIAGNOSIS — N1832 Chronic kidney disease, stage 3b (HCC): Secondary | ICD-10-CM

## 2023-08-17 MED ORDER — TORSEMIDE 20 MG PO TABS
20 | ORAL_TABLET | ORAL | 0 refills | Status: DC
Start: 2023-08-17 — End: 2023-08-26

## 2023-08-17 NOTE — Patient Instructions (Signed)
 Labs soon   Increase Torsemide   Please avoid NSAIDs medications  - e.g. Advil, motrin, ibuprofen, aleve, diclofenac, mobic, celebrex, nabumetone.etc: they can harm your kidneys!   Low sodium diet

## 2023-08-17 NOTE — Progress Notes (Signed)
 Office : [phone]     Fax :[phone]      Nephrology progress Note        History of Present Illness:    This is a 67 y.o. female who presents to the office for evaluation of  worsening renal function .  H/o DM 2 - none   H/o HTN - now BP better after loss of weight   H/o Renal stones - none   H/o renal disease in family   H/o CAD - h/o Stent in past. H/o CHF   CHF?     H/o smoking .    Today visit 08/17/23  Swelling improved once Torsemide  increased to 20 mg bid   Cr 1.4 8/22  <--1.2 7/24: base line   Lytes good  GFR 50 --> 40   Ca wnl  CBC wnl  UACR UPCR wnl 5/24   Heart cath neg   BP here 130/60  Meds include Torsemide , Dilt, Imdur    BP at home not controlled recently     Pt is c/o SOB and swellign   Pt is diagnsoed with HFpEF and mild pulm HTN   Pt denies CP/palpitations/abdominal pain//V.   Pt denies fever/ chills  Pt denies dysuria or hematuria     Pt is on TOrsemide  20 mg tid   Also SPironolaconte Imdur  Dilt       Hemoglobin A1C   Date Value Ref Range Status   03/31/2023 5.5 See comment % Final     Comment:     Comment:  Diagnosis of Diabetes: > or = 6.5%  Increased risk of diabetes (Prediabetes): 5.7-6.4%  Glycemic Control: Nonpregnant Adults: <7.0%                    Pregnant: <6.0%           Recently stopped lisinopril  .   Pt is on spironolactone   , Dilt, Imdur , Torsemide      Previous labs:   Cr 1.3- 1.5 9/23  <-- 1.6 7/23<--1.4 4/23<--1. 3/23<--1.3<--2.0 (2/23) <--1.8 1/23<--1.4 8/22<--1.1 3/22<-- normal 2021 <--base line 1.0-1.1     GFR 44  Lytes ok   UPCR 0.07  UACR - neg 7/23  UA- no RBC WBC   HCV neg   CT ABD 5/23: KIDNEYS/URETERS:  Right renal cyst.  No hydronephrosis, stone, or suspicious mass . ADRENAL LESION  Mri 9/23: 1.3 cm right adrenal nodule demonstrates signal characteristics consistent with an adenoma.   S/p CTPA 2/22  Echo 7/23:    systolic function with an estimated ejection fraction of 55-60%.   No regional wall motion abnormalities are seen.    Normal diastolic function.E/e=11.35.    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/2017    Depression     GERD (gastroesophageal reflux disease)     NO LONGER SINCE LAP    GERD (gastroesophageal reflux disease) 01/23/2009    Hx of blood clots     Hyperlipidemia     hx; resolved with lap band    Hypertension  Idiopathic peripheral neuropathy 03/26/2017    Melanoma (HCC)     upper back    MRSA (methicillin resistant staph aureus) culture positive 10/17/2019    leg abscess    Obesity     hx of; had lap band    Obstructive sleep apnea 09/18/2015    uses bi-pap    Obstructive sleep apnea (adult) (pediatric) 09/18/2015    Pulmonary embolism (HCC)     Stenosis of right carotid artery 06/16/2021    Type 2 diabetes mellitus with diabetic polyneuropathy, without long-term current use of insulin  (HCC) 01/26/2022       Past Surgical History:        Procedure Laterality Date    BACK SURGERY      neck  plates    BREAST SURGERY      left lumpectomy    CERVICAL FUSION      CESAREAN SECTION      CHOLECYSTECTOMY      COLONOSCOPY      COLONOSCOPY  04/08/2010    COLONOSCOPY N/A 04/18/2021    COLONOSCOPY DIAGNOSTIC performed by Garnette SHAUNNA Lunger, MD at [facility] ENDOSCOPY    CORONARY ANGIOPLASTY  03/31/2016    mid LAD 3.25x18 alpine    DIAGNOSTIC CARDIAC CATH LAB PROCEDURE  08/09/2019    normal    DILATATION, ESOPHAGUS      ENDOSCOPY, COLON, DIAGNOSTIC      HYSTERECTOMY (CERVIX STATUS UNKNOWN)      HYSTERECTOMY, VAGINAL      LAP BAND  05/01/2008    Dr. Brady    OTHER SURGICAL HISTORY      [place] Filter: curently in place    OTHER SURGICAL HISTORY  07/05/2013    lap band removal    SHOULDER SURGERY      SKIN CANCER EXCISION   12/29/2017    UPPER GASTROINTESTINAL ENDOSCOPY  05/19/2013    UPPER GASTROINTESTINAL ENDOSCOPY  07/21/2013    ESOPHAGEAL STENT PLACEMENT    UPPER GASTROINTESTINAL ENDOSCOPY N/A 07/31/2014    Esophagogastroduodenoscopy with esophageal balloon dilation    UPPER GASTROINTESTINAL ENDOSCOPY N/A 04/18/2021    EGD DILATION BALLOON performed by Garnette SHAUNNA Lunger, MD at [facility] ENDOSCOPY    VASCULAR SURGERY  04/2015    [place], celiac artery angiogram, normal abd arteries       Current Medications:    Current Outpatient Medications   Medication Sig Dispense Refill    spironolactone  (ALDACTONE ) 25 MG tablet Take 1 tablet by mouth daily 30 tablet 3    torsemide  (DEMADEX ) 20 MG tablet Take 1 tablet by mouth in the morning, at noon, and at bedtime 90 tablet 0    ranolazine  (RANEXA ) 500 MG extended release tablet Take 1 tablet by mouth 2 times daily 180 tablet 3    isosorbide  mononitrate (IMDUR ) 30 MG extended release tablet Take 1 tablet by mouth daily 90 tablet 3    triamcinolone  (KENALOG ) 0.1 % cream Apply to the affected areas of the ears twice daily for up to 2 weeks or until improved. 15 g 2    gabapentin  (NEURONTIN ) 300 MG capsule Take 1 capsule by mouth nightly.      Magnesium  400 MG CAPS Take by mouth daily      dilTIAZem  (CARDIZEM  CD) 180 MG extended release capsule TAKE ONE CAPSULE BY MOUTH DAILY 90 capsule 3    fluocinonide  (LIDEX ) 0.05 % external solution APPLY SPARINGLY TO THE BACK OF THE SCALP DAILY AS NEEDED FOR ITCHING 60 mL 1  nitroGLYCERIN  (NITROSTAT ) 0.4 MG SL tablet up to max of 3 total doses. If no relief after 1 dose, call 911. 25 tablet 3    clobetasol  (TEMOVATE ) 0.05 % cream Apply to affected area twice daily for up to 2 weeks or until improved. 60 g 1    omeprazole  (PRILOSEC ) 40 MG delayed release capsule TAKE ONE CAPSULE BY MOUTH DAILY 30 capsule 0    triamcinolone  (KENALOG ) 0.1 % cream Apply to affected area twice daily for up to 2 weeks or until improved. 15 g 2    montelukast  (SINGULAIR ) 10 MG tablet  TAKE ONE TABLET BY MOUTH DAILY. **MUST CALL MD FOR APPOINTMENT 30 tablet 5    oxyCODONE -acetaminophen  (PERCOCET ) 7.5-325 MG per tablet as needed.      rosuvastatin  (CRESTOR ) 20 MG tablet daily      Coenzyme Q10 (CO Q10) 200 MG CAPS Take by mouth daily (with breakfast)      calcium  citrate-vitamin D  (CITRICAL + D) 315-250 MG-UNIT TABS per tablet Take 2 tablets by mouth 2 times daily      Cholecalciferol  (VITAMIN D3) 50 MCG (2000 UT) CAPS Take 1 capsule by mouth daily 90 capsule 3    albuterol  sulfate HFA (PROVENTIL  HFA) 108 (90 Base) MCG/ACT inhaler Inhale 2 puffs into the lungs every 6 hours as needed for Wheezing 3 Inhaler 3    lidocaine  (LIDODERM ) 5 % Place 1 patch onto the skin daily 12 hours on, 12 hours off. (Patient taking differently: Place 1 patch onto the skin as needed 12 hours on, 12 hours off.) 30 patch 0    aspirin  81 MG EC tablet Take 1 tablet by mouth daily 30 tablet 11    Multiple Vitamins-Minerals (MULTIVITAMIN PO) Take 1 tablet by mouth daily        No current facility-administered medications for this visit.       Allergies:  Ibuprofen , Lisinopril , Seasonal, and Talwin [pentazocine]    Social History:   Social History     Socioeconomic History    Marital status: Divorced     Spouse name: [name]    Number of children: 2    Years of education: 12    Highest education level: Not on file   Occupational History    Occupation: Retired   Tobacco Use    Smoking status: Former     Current packs/day: 0.00     Average packs/day: 0.5 packs/day for 40.0 years (20.0 ttl pk-yrs)     Types: Cigarettes     Start date: 08/07/1972     Quit date: 08/07/2012     Years since quitting: 11.0    Smokeless tobacco: Never   Vaping Use    Vaping status: Never Used   Substance and Sexual Activity    Alcohol use: No    Drug use: No    Sexual activity: Not Currently     Partners: Male   Other Topics Concern    Not on file   Social History Narrative    Works as conservation officer, nature at [employer] Strain: Low Risk  (05/15/2020)    Overall Financial Resource Strain (CARDIA)     Difficulty of Paying Living Expenses: Not hard at all   Food Insecurity: No Food Insecurity (03/31/2023)    Hunger Vital Sign     Worried About Running Out of Food in the Last Year: Never true  Ran Out of Food in the Last Year: Never true   Transportation Needs: No Transportation Needs (03/31/2023)    PRAPARE - Therapist, Art (Medical): No     Lack of Transportation (Non-Medical): No   Physical Activity: Not on file   Stress: Not on file   Social Connections: Not on file   Intimate Partner Violence: Unknown (12/25/2022)    [facility]     Interpersonal Safety     Feel physically or emotionally unsafe where currently live: Not on file     Harm by anyone: Not on file     Emotionally Harmed: Not on file   Housing Stability: Low Risk  (03/31/2023)    Housing Stability Vital Sign     Unable to Pay for Housing in the Last Year: No     Number of Places Lived in the Last Year: 1     Unstable Housing in the Last Year: No       Family History:   Family History   Problem Relation Age of Onset    Arthritis Mother     Cancer Mother     Depression Mother     High Blood Pressure Mother     Heart Disease Mother 26        MI     Ovarian Cancer Mother 54    Cancer Father 40        colon    Other Sister         OSA    Cancer Sister         lung liver    Other Brother         OSA    Sleep Apnea Brother     Other Brother         OSA    Sleep Apnea Brother     Heart Disease Maternal Grandmother     Early Death Paternal Grandmother     Heart Disease Paternal Grandfather     Other Daughter         OSA    Diabetes Other     High Blood Pressure Other     Obesity Other        Review of Systems:    Constitutional: No fever, no chills, no lethargy, no weakness.  HEENT:  No headache,  sore throat.  Cardiac:  No chest pain, ++, orthopnea or PND.  Chest:              No cough, phlegm or wheezing.  Abdomen:  No  abdominal pain, nausea or vomiting.  Neuro:                No focal weakness, abnormal movements orseizure like activity.  Skin:   No rashes, no itching.  GU:   No hematuria,, no dysuria, no flank pain.  Extremities:  +2 swelling       Objective:  BP 130/61   Pulse 83   Wt (!) 146.4 kg (322 lb 12.8 oz)   BMI 53.72 kg/m     Physical Exam:  General appearance:Awake, alert, in no acute distress  Skin: warm and dry, no rash or erythema    Eyes: conjunctivae normal and sclera anicteric    ENT: :no swelling     Neck: without any JVD. No carotid bruits or thyromegaly.      Pulmonary: b/l rales     Cardiovascular:Normal S1 & S2, No S3 or  S4,  No  Pericardial rub , No Murmur     Abdomen: soft nontender, bowel sounds present, no organomegaly,  No ascites      Extremities: no cyanosis, +1-2  b/l pitting edema    Labs:    CBC:   Lab Results   Component Value Date/Time    WBC 6.4 04/28/2023 10:03 AM    RBC 4.42 04/28/2023 10:03 AM    HGB 12.7 04/28/2023 10:03 AM    HCT 41.9 04/28/2023 10:03 AM    MCV 94.8 04/28/2023 10:03 AM    RDW 14.7 04/28/2023 10:03 AM    PLT 174 04/28/2023 10:03 AM    MPV 11.3 04/28/2023 10:03 AM      BMP:   Lab Results   Component Value Date/Time    NA 142 06/08/2023 03:13 PM    NA 143 04/28/2023 10:03 AM    NA 141 04/06/2023 04:54 AM    K 4.4 06/08/2023 03:13 PM    K 4.5 04/28/2023 10:03 AM    K 4.4 04/06/2023 04:54 AM    K 4.6 10/17/2019 06:07 AM    K 3.9 05/05/2017 03:21 AM    CL 102 06/08/2023 03:13 PM    CL 104 04/28/2023 10:03 AM    CL 106 04/06/2023 04:54 AM    CO2 28 06/08/2023 03:13 PM    CO2 26 04/28/2023 10:03 AM    CO2 23 04/06/2023 04:54 AM    BUN 13 06/08/2023 03:13 PM    BUN 21 04/28/2023 10:03 AM    BUN 17 04/06/2023 04:54 AM    CREATININE 1.2 06/08/2023 03:13 PM    CREATININE 1.34 04/28/2023 10:03 AM    CREATININE 1.1 04/06/2023 04:54 AM    GLUCOSE 101 06/08/2023 03:13 PM    GLUCOSE 128 04/28/2023 10:03 AM    GLUCOSE 136 04/06/2023 04:54 AM    CALCIUM  9.8 06/08/2023 03:13 PM    CALCIUM   9.3 04/28/2023 10:03 AM    CALCIUM  9.6 04/06/2023 04:54 AM      BNP:No results found for: BNP  PHOSPHORUS:    Lab Results   Component Value Date/Time    PHOS 3.8 08/21/2013 03:46 PM    PHOS 3.3 08/18/2013 08:13 AM    PHOS 3.5 08/17/2013 06:49 AM     MAGNESIUM :   Lab Results   Component Value Date/Time    MG 1.90 04/05/2023 04:41 AM     ALBUMIN:   No results found for: LABALBU    IRON:    Lab Results   Component Value Date/Time    IRON 93 03/26/2010 05:35 PM     IRON SATURATION:    No components found for: LABIRON    TIBC:    Lab Results   Component Value Date/Time    TIBC 397 03/26/2010 05:35 PM     FERRITIN:    Lab Results   Component Value Date/Time    FERRITIN 61.9 05/02/2009 05:40 PM     ANA:   Lab Results   Component Value Date    ANA Negative 07/26/2019       SPEP:   Lab Results   Component Value Date/Time    GAMGLOB 1.2 03/29/2017 03:33 PM     UPEP: No results found for: TPU   HEPBSAG:No results found for: HEPBSAG  HEPCAB:No results found for: HEPCAB  C3: No results found for: C3  C4: No results found for: C4  MPO ANCA: No results found for: MPO .  PR3 ANCA:  No results found for: PR3  PTH: No results found for: PTH    Urine Creatinine:    No results found for: LABCREA    Urine Eosinophils: No results found for: UREO  Urine Protein:  No results found for: TPU  Urinalysis:  U/A:   Lab Results   Component Value Date/Time    NITRU Negative 03/31/2023 04:01 PM    COLORU Yellow 03/31/2023 04:01 PM    PHUR 7.0 03/31/2023 04:01 PM    LABCAST 0-1 Hyaline 07/09/2013 02:09 AM    WBCUA 4 07/16/2014 07:26 PM    RBCUA NEGATIVE 02/13/2021 03:03 PM    MUCUS PRESENT 02/13/2021 03:03 PM    YEAST 0 11/25/2017 04:24 PM    BACTERIA 0 11/25/2017 04:24 PM    BACTERIA 2+ 07/16/2014 07:26 PM    CLARITYU Clear 03/31/2023 04:01 PM    LEUKOCYTESUR Negative 03/31/2023 04:01 PM    UROBILINOGEN 1.0 03/31/2023 04:01 PM    BILIRUBINUR Negative 03/31/2023 04:01 PM    BILIRUBINUR NEGATIVE 02/13/2021 03:03 PM     BLOODU Negative 03/31/2023 04:01 PM    GLUCOSEU Negative 03/31/2023 04:01 PM    KETUA Negative 03/31/2023 04:01 PM         Radiology:  Reviewed as available.    Assessment/Plan:    CKD stage 3B sus CRS  Creat ~ 1.4     No proteinuria   Torsemide  20 mg tid (increased 1 week ago)   Off  Metfrmin   FLC SPEP- normal   Plan:  No need for RAASi  Needs to coknsoder SGLT-2 due tok heart failure and CKD   Recheck labs : BMP , UACR before next visit   Torsemide  : 20 mg tid, extra dose in AM        H/o CHF but EF improved, CAD  Still edematous   Keep  torsemide  --> increase to 40/202/20   Monitor BP   Low sodium diet  No NSAIDS     H/o adrenal nodule . CT scan in 2020  Check CT abdomen to see the status of adrenal nodule - pt will schedule CT scan   Repeat  mri- BENIGN     HTN - fluctuate   BP reasonable - border line   Spironolactone  renewed   IncreasedTorsemide for edema   Discussed low salt diet  No NSAIDS  ABPM   Discussed with patient in detail.   Counseling done for diet and lifestyle changes.  Explained importance of compliance with medications     Tiburcio Bateman, MD  Nephrology     [facility] Office : [address] , [place] [address]  [facility] Office: [address], [place], [place] [address]  [facility] Office: [address], [place] [address].  [facility] Office: [address] [place] [address]  Office : [phone]  Fax :[phone]     Discussed w/ MD- Metformin discontinued

## 2023-08-18 ENCOUNTER — Encounter

## 2023-08-18 LAB — URINALYSIS WITH MICROSCOPIC
Bilirubin, Urine: NEGATIVE
Blood, Urine: NEGATIVE
Epithelial Cells, UA: 4 /HPF (ref 0–5)
Glucose, Ur: NEGATIVE mg/dL
Hyaline Casts, UA: 0 /LPF (ref 0–8)
Ketones, Urine: NEGATIVE mg/dL
Leukocyte Esterase, Urine: NEGATIVE
Nitrite, Urine: NEGATIVE
Protein, UA: NEGATIVE mg/dL
RBC, UA: 1 /HPF (ref 0–4)
Specific Gravity, UA: 1.013 (ref 1.005–1.030)
Urobilinogen, Urine: 1 U/dL (ref <2.0)
WBC, UA: 1 /HPF (ref 0–5)
pH, Urine: 7.5 (ref 5.0–8.0)

## 2023-08-18 LAB — CBC WITH AUTO DIFFERENTIAL
Basophils %: 0.5 %
Basophils Absolute: 0 10*3/uL (ref 0.0–0.2)
Eosinophils %: 2.7 %
Eosinophils Absolute: 0.2 10*3/uL (ref 0.0–0.6)
Hematocrit: 38.9 % (ref 36.0–48.0)
Hemoglobin: 13 g/dL (ref 12.0–16.0)
Lymphocytes %: 28.1 %
Lymphocytes Absolute: 2.4 10*3/uL (ref 1.0–5.1)
MCH: 29.2 pg (ref 26.0–34.0)
MCHC: 33.6 g/dL (ref 31.0–36.0)
MCV: 87 fL (ref 80.0–100.0)
MPV: 9.6 fL (ref 5.0–10.5)
Monocytes %: 9.8 %
Monocytes Absolute: 0.8 10*3/uL (ref 0.0–1.3)
Neutrophils %: 58.9 %
Neutrophils Absolute: 5.1 10*3/uL (ref 1.7–7.7)
Platelets: 154 10*3/uL (ref 135–450)
RBC: 4.47 M/uL (ref 4.00–5.20)
RDW: 15.3 % (ref 12.4–15.4)
WBC: 8.6 10*3/uL (ref 4.0–11.0)

## 2023-08-18 LAB — BRAIN NATRIURETIC PEPTIDE: Pro-BNP: 62 pg/mL (ref 0–124)

## 2023-08-18 LAB — BASIC METABOLIC PANEL
Anion Gap: 12 (ref 3–16)
BUN: 16 mg/dL (ref 7–20)
CO2: 28 mmol/L (ref 21–32)
Calcium: 9.2 mg/dL (ref 8.3–10.6)
Chloride: 102 mmol/L (ref 99–110)
Creatinine: 1.4 mg/dL — ABNORMAL HIGH (ref 0.6–1.2)
Est, Glom Filt Rate: 42 — AB (ref >60)
Glucose: 191 mg/dL — ABNORMAL HIGH (ref 70–99)
Potassium: 4.4 mmol/L (ref 3.5–5.1)
Sodium: 142 mmol/L (ref 136–145)

## 2023-08-18 LAB — ALBUMIN/CREATININE RATIO, URINE
Albumin Urine: <1.2 mg/dL (ref <2.0)
Creatinine, Ur: 68.3 mg/dL (ref 28.0–259.0)

## 2023-08-18 LAB — PROTEIN / CREATININE RATIO, URINE
Creatinine, Ur: 67.8 mg/dL (ref 28.0–259.0)
Protein, Ur: 6.53 mg/dL (ref <12)
Protein/Creat Ratio: 0.1 mg/dL

## 2023-08-18 MED ORDER — BLOOD PRESSURE CUFF MISC
Freq: Every day | 0 refills | Status: AC
Start: 2023-08-18 — End: ?

## 2023-08-18 MED ORDER — BLOOD PRESSURE CUFF MISC
Freq: Every day | 0 refills | Status: DC
Start: 2023-08-18 — End: 2023-08-18

## 2023-08-18 MED ORDER — BLOOD PRESSURE CUFF MISC
Freq: Two times a day (BID) | 0 refills | Status: AC
Start: 2023-08-18 — End: ?

## 2023-08-18 NOTE — Progress Notes (Signed)
 Formatting of this note is different from the original.  Date: 08/18/2023    Name: Rachael Mays  Diagnosis:   1. Radiculopathy of lumbar region      Referring Provider: Sebastian Berg, DO  Visit #: 9/10 (re-check next visit, progress is unclear, pt primed for possibility of D/C.)    S: Pt notes that the left LE was hurting yesterday.  LBP and left knee pain continue.     Functional deficit(s):  Unable to walk around the house.     Pain: 6/10    Increased pain     O:  Objective Eval    Physical Therapy Treatment on this date as follows:      Medbridge Access Code  [email or web address] printed       Therapeutic Exercise Details               See below:  (This would address LE's, UE's, spine, cardio and glutes in a concise format)    Edu and pt is agreeable.  She notes low strength never recovered since hospitalization about 7 years ago.    HEP: T-band fwd elevation HEP: T-band row x15   HEP: hip abd standing x15ea       CC: rows 30# x25   CC: LPD 30# x25     Seated hip abd X15 vs blue    Seated dorsiflexion x15ea    Seated heel slide on towel x15ea     Neuromuscular Re Education Details     Edu on general exercise (e.g. pt's [employer] low impact work out) as appropriate to add to PT regime in addition to [employer] exercise Rx.    Inquiry about what type of exercises this includes.  Attempted to draw elements of this program into PT. Prev     Edu on asymptomatic incidence of disc degen and height loss on scan. Prev         Other Interventions Details   Leg pull  Reclined CP to anterior knees. 10'           A: Timed-codes: TE 25 min.    Untimed-codes: NA    Today's treatment consisted of all interventions and activities documented with the note above    Pt seems to be getting more swollen, but not just at the left knee.     P: Plan:   [name] will continue with current plan of care and established home exercise program as documented within the note above  until next visit which will focus on advancing therapeutic  techniques and functional exercises as able to progress toward previous level of function.      Treatment Minutes as follows:    Timed-code Treatment: 25 (min.) Total Treatment Time:  25 (min.)         Electronically signed by Debara Heinz, PT at 08/18/2023  6:00 PM EDT

## 2023-08-19 MED ORDER — BLOOD PRESSURE KIT
PACK | Freq: Two times a day (BID) | 0 refills | 30.00000 days | Status: AC
Start: 2023-08-19 — End: 2024-12-19

## 2023-08-19 NOTE — Addendum Note (Signed)
 Addended by: Cordella Register on: 08/19/2023 11:01 AM     Modules accepted: Orders

## 2023-08-20 ENCOUNTER — Inpatient Hospital Stay
Admit: 2023-08-20 | Discharge: 2023-08-25 | Payer: MEDICARE | Attending: Cardiovascular Disease | Primary: Sports Medicine

## 2023-08-20 ENCOUNTER — Inpatient Hospital Stay
Admit: 2023-08-20 | Discharge: 2023-08-24 | Payer: MEDICARE | Attending: Cardiovascular Disease | Primary: Sports Medicine

## 2023-08-20 VITALS — BP 142/74 | Ht 65.0 in | Wt 322.0 lb

## 2023-08-20 DIAGNOSIS — I6523 Occlusion and stenosis of bilateral carotid arteries: Secondary | ICD-10-CM

## 2023-08-20 DIAGNOSIS — I509 Heart failure, unspecified: Secondary | ICD-10-CM

## 2023-08-20 LAB — VAS DUP CAROTID BILATERAL
Left CCA dist EDV: 23.1 cm/s
Left CCA dist PSV: 80.6 cm/s
Left CCA mid EDV: 19.6 cm/s
Left CCA mid PSV: 81.3 cm/s
Left CCA prox EDV: 18.9 cm/s
Left CCA prox PSV: 77.1 cm/s
Left ECA PSV: 201 cm/s
Left ICA dist EDV: 27.3 cm/s
Left ICA dist PSV: 79.9 cm/s
Left ICA mid EDV: 18.4 cm/s
Left ICA mid PSV: 135 cm/s
Left ICA prox EDV: 7.8 cm/s
Left ICA prox PSV: 94.1 cm/s
Left ICA/CCA PSV: 1.66
Left arm BP: 139 mmHg
Left subclavian prox PSV: 148 cm/s
Right CCA dist EDV: 18.6 cm/s
Right CCA mid EDV: 21.7 cm/s
Right CCA mid PSV: 88.3 cm/s
Right CCA prox EDV: 19.6 cm/s
Right CCA prox PSV: 84.1 cm/s
Right ECA PSV: 167 cm/s
Right ICA dist EDV: 24.2 cm/s
Right ICA dist PSV: 97.8 cm/s
Right ICA mid EDV: 41.2 cm/s
Right ICA mid PSV: 162 cm/s
Right ICA prox EDV: 53.8 cm/s
Right ICA prox PSV: 140 cm/s
Right ICA/CCA PSV: 1.83
Right arm BP: 139 mmHg
Right cca dist PSV: 70.8 cm/s
Right subclavian prox PSV: 160 cm/s
Right vertebral PSV: 62.3 cm/s

## 2023-08-20 MED ORDER — PERFLUTREN LIPID MICROSPHERE IV SUSP
0.9 | Freq: Once | INTRAVENOUS | Status: AC | PRN
Start: 2023-08-20 — End: 2023-08-20

## 2023-08-20 MED ADMIN — perflutren lipid microspheres syringe: 10 mL | INTRAVENOUS | @ 19:00:00 | NDC 64253011130

## 2023-08-20 NOTE — Result Encounter Note (Signed)
 Kjc recommends increasing to 240mg  cardizem . Will notify patient.     Hi [name],   Dr Judythe and I looked at your echo and your carotid dopplers. They both look pretty good, the only thing on the echo is that is shows the Left ventricle is slightly thickened, we should control your blood pressure better, or records show it has been in the 140's for the past few months. Dr Judythe would like for you to increase your diltiazem  to 240 mg. I will sent a new prescription to your pharmacy.   Thank you ,  Jon

## 2023-08-21 LAB — ECHO (TTE) COMPLETE (PRN CONTRAST/BUBBLE/STRAIN/3D)
AV Area by Peak Velocity: 1.6 cm2
AV Area by VTI: 1.6 cm2
AV Mean Gradient: 14 mmHg
AV Mean Velocity: 1.7 m/s
AV Peak Gradient: 24 mmHg
AV Peak Velocity: 2.5 m/s
AV Peak Velocity: 2.5 m/s
AV VTI: 50.6 cm
AVA/BSA Peak Velocity: 0.7 cm2/m2
AVA/BSA VTI: 0.7 cm2/m2
Ao Root Index: 1.03 cm/m2
Aortic Root: 2.5 cm
Ascending Aorta Index: 1.12 cm/m2
Ascending Aorta: 2.7 cm
Body Surface Area: 2.59 m2
E/E' Lateral: 12
E/E' Ratio (Averaged): 12
E/E' Septal: 12
EF BP: 64 % (ref 55–100)
Fractional Shortening 2D: 39 % (ref 28–44)
IVSd: 1.3 cm — AB (ref 0.6–0.9)
LA Area 2C: 23.3 cm2
LA Area 4C: 25.4 cm2
LA Major Axis: 6.4 cm
LA Minor Axis: 6.4 cm
LA Volume BP: 75 mL — AB (ref 22–52)
LA Volume Index BP: 31 mL/m2 (ref 16–34)
LA Volume Index MOD A2C: 29 mL/m2 (ref 16–34)
LA Volume Index MOD A4C: 33 mL/m2 (ref 16–34)
LA Volume MOD A2C: 70 mL — AB (ref 22–52)
LA Volume MOD A4C: 81 mL — AB (ref 22–52)
LV E' Lateral Velocity: 7 cm/s
LV E' Septal Velocity: 7 cm/s
LV EDV A2C: 126 mL
LV EDV A4C: 133 mL
LV EDV Index A2C: 52 mL/m2
LV EDV Index A4C: 55 mL/m2
LV ESV A2C: 45 mL
LV ESV A4C: 46 mL
LV ESV Index A2C: 19 mL/m2
LV ESV Index A4C: 19 mL/m2
LV Ejection Fraction A2C: 64 %
LV Ejection Fraction A4C: 66 %
LV Mass 2D Index: 88.9 g/m2 (ref 43–95)
LV Mass 2D: 215.1 g — AB (ref 67–162)
LV RWT Ratio: 0.59
LVIDd Index: 1.82 cm/m2
LVIDd: 4.4 cm (ref 3.9–5.3)
LVIDs Index: 1.12 cm/m2
LVIDs: 2.7 cm
LVOT Area: 3.1 cm2
LVOT Diameter: 2 cm
LVOT Mean Gradient: 3 mmHg
LVOT Peak Gradient: 7 mmHg
LVOT Peak Velocity: 1.3 m/s
LVOT SV: 80.7 mL
LVOT Stroke Volume Index: 33.3 mL/m2
LVOT VTI: 25.7 cm
LVOT:AV VTI Index: 0.51
LVPWd: 1.3 cm — AB (ref 0.6–0.9)
MV A Velocity: 0.75 m/s
MV E Velocity: 0.84 m/s
MV E/A: 1.12
PV Max Velocity: 1.4 m/s
PV Peak Gradient: 8 mmHg
RA Area 4C: 12.8 cm2
RA Volume Index A4C: 12 mL/m2
RA Volume: 30 mL
RV Basal Dimension: 4.8 cm
RV Free Wall Peak S': 21 cm/s
RV Longitudinal Dimension: 7.9 cm
RV Mid Dimension: 4.4 cm
TAPSE: 3.2 cm (ref 1.7–?)

## 2023-08-26 ENCOUNTER — Encounter

## 2023-08-26 MED ORDER — TORSEMIDE 20 MG PO TABS
20 MG | ORAL_TABLET | ORAL | 0 refills | Status: DC
Start: 2023-08-26 — End: 2023-09-20

## 2023-08-26 MED ORDER — EMPAGLIFLOZIN 10 MG PO TABS
10 | ORAL_TABLET | Freq: Every day | ORAL | 1 refills | Status: AC
Start: 2023-08-26 — End: ?

## 2023-08-26 MED ORDER — DILTIAZEM HCL ER COATED BEADS 240 MG PO CP24
240 | ORAL_CAPSULE | Freq: Every day | ORAL | 1 refills | Status: AC
Start: 2023-08-26 — End: ?

## 2023-08-30 ENCOUNTER — Encounter

## 2023-09-07 ENCOUNTER — Encounter

## 2023-09-09 NOTE — Telephone Encounter (Signed)
 Rachael Mays  Pt want to pick Placard prescription next week at [facility] location

## 2023-09-09 NOTE — Telephone Encounter (Signed)
 Rachael Mays   Pt want to pick up Placard Prescription from [facility] location next week

## 2023-09-13 NOTE — Other (Signed)
I called pt   She has an appt tomorrow- we will discuss

## 2023-09-14 ENCOUNTER — Ambulatory Visit: Admit: 2023-09-14 | Discharge: 2023-09-14 | Payer: MEDICARE | Attending: Internal Medicine | Primary: Sports Medicine

## 2023-09-14 DIAGNOSIS — N179 Acute kidney failure, unspecified: Secondary | ICD-10-CM

## 2023-09-14 NOTE — Patient Instructions (Addendum)
Strict low sodium diet : less than 2000 mg of Sodium per day   Labs in 10-14 days   Schedule kidney ultrasound   Follow up in 3-4 weeks   Please avoid NSAIDs medications  - e.g. Advil, motrin, ibuprofen, aleve, diclofenac, mobic, celebrex, nabumetone.etc: they can harm your kidneys!     Patient Education        Low Sodium Diet (2,000 Milligram): Care Instructions  Overview     Limiting sodium can be an important part of managing some health problems.  The most common source of sodium is salt. People get most of the salt in their diet from canned, prepared, and packaged foods. Fast food and restaurant meals also are very high in sodium. Your doctor will probably limit your sodium to less than 2,000 milligrams (mg) a day. This limit counts all the sodium in prepared and packaged foods and any salt you add to your food.  Follow-up care is a key part of your treatment and safety. Be sure to make and go to all appointments, and call your doctor if you are having problems. It's also a good idea to know your test results and keep a list of the medicines you take.  How can you care for yourself at home?  Read food labels  Read labels on cans and food packages. The labels tell you how much sodium is in each serving. Make sure that you look at the serving size. If you eat more than the serving size, you have eaten more sodium.  Food labels also tell you the Percent Daily Value for sodium. Choose products with low Percent Daily Values for sodium.  Be aware that sodium can come in forms other than salt, including monosodium glutamate (MSG), sodium citrate, and sodium bicarbonate (baking soda). MSG is often added to Asian food. When you eat out, you can sometimes ask for food without MSG or added salt.  Buy low-sodium foods  Buy foods that are labeled "unsalted" (no salt added), "sodium-free" (less than 5 mg of sodium per serving), or "low-sodium" (140 mg or less of sodium per serving). Foods labeled "reduced-sodium" and "light  sodium" may still have too much sodium. Be sure to read the label to see how much sodium you are getting.  Buy fresh vegetables, or frozen vegetables without added sauces. Buy low-sodium versions of canned vegetables, soups, and other canned goods.  Prepare low-sodium meals  Cut back on the amount of salt you use in cooking. This will help you adjust to the taste. Do not add salt after cooking. One teaspoon of salt has about 2,300 mg of sodium.  Take the salt shaker off the table.  Flavor your food with garlic, lemon juice, onion, vinegar, herbs, and spices. Do not use soy sauce, lite soy sauce, steak sauce, onion salt, garlic salt, celery salt, or ketchup on your food.  Use low-sodium salad dressings, sauces, and ketchup. Or make your own salad dressings and sauces without adding salt.  Use less salt (or none) when recipes call for it. You can often use half the salt a recipe calls for without losing flavor. Other foods such as rice, pasta, and grains do not need added salt.  Rinse canned vegetables, and cook them in fresh water. This removes some--but not all--of the salt.  Avoid water that is naturally high in sodium or that has been treated with water softeners, which add sodium. If you buy bottled water, read the label and choose a sodium-free brand.  Avoid high-sodium foods  Avoid eating:  Smoked, cured, salted, and canned meat, fish, and poultry.  Ham, bacon, hot dogs, and luncheon meats.  Regular, hard, and processed cheese and regular peanut butter.  Crackers with salted tops, and other salted snack foods such as pretzels, chips, and salted popcorn.  Frozen prepared meals, unless labeled low-sodium.  Canned and dried soups, broths, and bouillon, unless labeled sodium-free or low-sodium.  Canned vegetables, unless labeled sodium-free or low-sodium.  [place] fries, pizza, tacos, and other fast foods.  Pickles, olives, ketchup, and other condiments, especially soy sauce, unless labeled sodium-free or  low-sodium.  Where can you learn more?  Go to [employer] and enter V843 to learn more about "Low Sodium Diet (2,000 Milligram): Care Instructions."  Current as of: August 26, 2022  Content Version: 14.2   [address] [place].   Care instructions adapted under license by [facility]. If you have questions about a medical condition or this instruction, always ask your healthcare professional. [employer] disclaims any warranty or liability for your use of this information.       Patient Education        How to Read a Food Label to Limit Sodium: Care Instructions  Overview  Limiting sodium can be an important part of managing some health problems.  Processed foods, fast food, and restaurant foods are the major sources of dietary sodium. The most common name for sodium is salt. Most packaged foods have a Nutrition Facts label. This will tell you how much sodium is in one serving of food.  Follow-up care is a key part of your treatment and safety. Be sure to make and go to all appointments, and call your doctor if you are having problems. It's also a good idea to know your test results and keep a list of the medicines you take.  How can you care for yourself at home?  Read ingredient lists on food labels  Read the list of ingredients on food labels to help you find how much sodium is in a food. The label lists the ingredients in a food in descending order (from the most to the least). If salt or sodium is high on the list, there may be a lot of sodium in the food.  Know that sodium has different names. Sodium is also called monosodium glutamate (MSG), sodium citrate, sodium alginate, and sodium phosphate.  Read Nutrition Facts labels  On most foods, there is a Nutrition Facts label. This will tell you how much sodium is in one serving of food. Look at both the serving size and the sodium amount. The serving size is located at the top of the label, usually right under  the "Nutrition Facts" title. The amount of sodium is given in the list under the title. It is given in milligrams (mg).  Check the serving size carefully. A single serving is often very small, and you may eat more than one serving. If this is the case, you will eat more sodium than listed on the label. For example, if the serving size for a canned soup is 1 cup and the sodium amount is 470 mg, if you have 2 cups you will eat 940 mg of sodium.  The nutrition facts for fresh fruits and vegetables are not listed on the food. They may be listed somewhere in the store. These foods usually have no sodium or low sodium.  The Nutrition Facts label also gives you the Percent Daily Value for sodium.  This is how much of the recommended amount of sodium a serving contains. The daily value for sodium is 2,300 mg. So if the Percent Daily Value says 50%, this means one serving is giving you half of this, or 1,150 mg.  Buy low-sodium foods  Look for foods that are made with less sodium. Watch for the following words on the label.  "Unsalted" means there is no sodium added to the food. But there may be sodium already in the food naturally.  "Sodium-free" means a serving has less than 5 mg of sodium.  "Very low sodium" means a serving has 35 mg or less of sodium.  "Low-sodium" means a serving has 140 mg or less of sodium.  "Reduced-sodium" means that there is 25% less sodium than what the food normally has. This is still usually too much sodium.  Buy fresh vegetables, or frozen vegetables without added sauces. Buy low-sodium versions of canned vegetables, soups, and other canned goods.  Where can you learn more?  Go to [employer] and enter B757 to learn more about "How to Read a Food Label to Limit Sodium: Care Instructions."  Current as of: September 29, 2022  Content Version: 14.2   [address] [place].   Care instructions adapted under license by [facility]. If you have questions about a medical  condition or this instruction, always ask your healthcare professional. [employer] disclaims any warranty or liability for your use of this information.

## 2023-09-14 NOTE — Progress Notes (Signed)
Formatting of this note might be different from the original.  Rooming Notes  09/14/2023   12:20 PM     Rachael Mays is a 67 year old female who presents today for Annual Physical .    Brief HPI:  - Dizzy this morning!    Labwork/testing completed: Yes    Influenza Vaccine (September-March): Interested in receiving today (please pend order)    Injection/Immunization given per Physician/NP/PA order.    Patient advised that it is best to remain in clinic or lobby for 20 minutes afterwards due to the possibility of medication allergy and to report any adverse reaction to me immediately    Urine CX collected, labeled and sent to lab.    Hessie Diener, LPN   [id number]    Electronically signed by Hessie Diener, LPN at 29/52/8413  9:01 PM EDT

## 2023-09-14 NOTE — Progress Notes (Signed)
Office : [phone]     Fax :[phone]      Nephrology progress Note        History of Present Illness:    This is a 67 y.o. female who presents to the office for evaluation of  worsening renal function .  H/o DM 2 - none   H/o HTN - now BP better after loss of weight   H/o Renal stones - none   H/o renal disease in family   H/o CAD - h/o Stent in past. H/o CHF   CHF?     H/o smoking .    Today visit 09/14/23  Swelling improved once Torsemide increased to 20 mg bid   Cr 1.55 10/24<-- 1.6 10/24<- 1.4 8/22  <--1.2 7/24: base line   Lytes good  GFR 50 --> 40 --> 36   Echo 9/24: G1DD   Ca wnl 10.4   CBC wnl  UACR UPCR wnl 9/24   Heart cath neg   BP at home 130/70  Meds include Torsemide, Dilt, Imdur , Jardiance   BP at home not controlled recently     DOE+   Pt is diagnsoed with HFpEF and mild pulm HTN   Pt denies CP/palpitations/abdominal pain//V.   Pt denies fever/ chills  Pt denies dysuria or hematuria     Pt is on TOrsemide 20 mg tid   Also SPironolaconte Imdur Dilt       Hemoglobin A1C   Date Value Ref Range Status   03/31/2023 5.5 See comment % Final     Comment:     Comment:  Diagnosis of Diabetes: > or = 6.5%  Increased risk of diabetes (Prediabetes): 5.7-6.4%  Glycemic Control: Nonpregnant Adults: <7.0%                    Pregnant: <6.0%           Recently stopped lisinopril .   Pt is on spironolactone  , Dilt, Imdur, Torsemide     Previous labs:   Cr 1.3- 1.5 9/23  <-- 1.6 7/23<--1.4 4/23<--1. 3/23<--1.3<--2.0 (2/23) <--1.8 1/23<--1.4 8/22<--1.1 3/22<-- normal 2021 <--base line 1.0-1.1     GFR 44  Lytes ok   UPCR 0.07  UACR - neg 7/23  UA- no RBC WBC   HCV neg   CT ABD 5/23: KIDNEYS/URETERS:  Right renal cyst.  No hydronephrosis, stone, or suspicious mass . ADRENAL LESION  Mri 9/23: 1.3 cm right adrenal nodule demonstrates signal characteristics consistent with an adenoma.   S/p CTPA 2/22  Echo 7/23:    systolic function with an estimated ejection fraction of 55-60%.    No regional wall motion abnormalities are seen.   Normal diastolic function.E/e"=11.35.    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/2017    Depression     GERD (gastroesophageal reflux disease)     NO LONGER SINCE LAP    GERD (gastroesophageal reflux disease) 01/23/2009    Hx of blood clots     Hyperlipidemia     hx;  resolved with lap band    Hypertension     Idiopathic peripheral neuropathy 03/26/2017    Melanoma (HCC)     upper back    MRSA (methicillin resistant staph aureus) culture positive 10/17/2019    leg abscess    Obesity     hx of; had lap band    Obstructive sleep apnea 09/18/2015    uses bi-pap    Obstructive sleep apnea (adult) (pediatric) 09/18/2015    Pulmonary embolism (HCC)     Stenosis of right carotid artery 06/16/2021    Type 2 diabetes mellitus with diabetic polyneuropathy, without long-term current use of insulin (HCC) 01/26/2022       Past Surgical History:        Procedure Laterality Date    BACK SURGERY      neck  plates    BREAST SURGERY      left lumpectomy    CERVICAL FUSION      CESAREAN SECTION      CHOLECYSTECTOMY      COLONOSCOPY      COLONOSCOPY  04/08/2010    COLONOSCOPY N/A 04/18/2021    COLONOSCOPY DIAGNOSTIC performed by Kandice Robinsons, MD at [facility] ENDOSCOPY    CORONARY ANGIOPLASTY  03/31/2016    mid LAD 3.25x18 alpine    DIAGNOSTIC CARDIAC CATH LAB PROCEDURE  08/09/2019    normal    DILATATION, ESOPHAGUS      ENDOSCOPY, COLON, DIAGNOSTIC      HYSTERECTOMY (CERVIX STATUS UNKNOWN)      HYSTERECTOMY, VAGINAL      LAP BAND  05/01/2008    Dr. Suzi Roots    OTHER SURGICAL HISTORY      [place] Filter: curently in place    OTHER SURGICAL HISTORY  07/05/2013    lap band removal     SHOULDER SURGERY      SKIN CANCER EXCISION  12/29/2017    UPPER GASTROINTESTINAL ENDOSCOPY  05/19/2013    UPPER GASTROINTESTINAL ENDOSCOPY  07/21/2013    ESOPHAGEAL STENT PLACEMENT    UPPER GASTROINTESTINAL ENDOSCOPY N/A 07/31/2014    Esophagogastroduodenoscopy with esophageal balloon dilation    UPPER GASTROINTESTINAL ENDOSCOPY N/A 04/18/2021    EGD DILATION BALLOON performed by Kandice Robinsons, MD at [facility] ENDOSCOPY    VASCULAR SURGERY  04/2015    Donzetta Sprung, celiac artery angiogram, normal abd arteries       Current Medications:    Current Outpatient Medications   Medication Sig Dispense Refill    dilTIAZem (CARDIZEM CD) 240 MG extended release capsule Take 1 capsule by mouth daily 90 capsule 1    empagliflozin (JARDIANCE) 10 MG tablet Take 1 tablet by mouth daily 90 tablet 1    torsemide (DEMADEX) 20 MG tablet Take one tablets in the morning  one tablet in the evening 90 tablet 0    Blood Pressure KIT 1 Device by Does not apply route 2 times daily at 0800 and 1400 1 kit 0    Blood Pressure Monitoring (BLOOD PRESSURE CUFF) MISC 1 Device by Does not apply route daily 1 each 0    Blood Pressure Monitoring (BLOOD PRESSURE CUFF) MISC 1 Device by Does not apply route in the morning and at bedtime 1 each 0    spironolactone (ALDACTONE) 25 MG tablet Take 1 tablet by mouth daily 30 tablet 3    ranolazine (RANEXA) 500 MG extended release tablet Take 1 tablet by mouth 2 times daily 180 tablet 3    isosorbide mononitrate (IMDUR) 30 MG  extended release tablet Take 1 tablet by mouth daily 90 tablet 3    triamcinolone (KENALOG) 0.1 % cream Apply to the affected areas of the ears twice daily for up to 2 weeks or until improved. 15 g 2    gabapentin (NEURONTIN) 300 MG capsule Take 1 capsule by mouth nightly.      Magnesium 400 MG CAPS Take by mouth daily      fluocinonide (LIDEX) 0.05 % external solution APPLY SPARINGLY TO THE BACK OF THE SCALP DAILY AS NEEDED FOR ITCHING 60 mL 1    nitroGLYCERIN (NITROSTAT) 0.4 MG SL tablet up  to max of 3 total doses. If no relief after 1 dose, call 911. 25 tablet 3    clobetasol (TEMOVATE) 0.05 % cream Apply to affected area twice daily for up to 2 weeks or until improved. 60 g 1    omeprazole (PRILOSEC) 40 MG delayed release capsule TAKE ONE CAPSULE BY MOUTH DAILY 30 capsule 0    triamcinolone (KENALOG) 0.1 % cream Apply to affected area twice daily for up to 2 weeks or until improved. 15 g 2    montelukast (SINGULAIR) 10 MG tablet TAKE ONE TABLET BY MOUTH DAILY. **MUST CALL MD FOR APPOINTMENT 30 tablet 5    oxyCODONE-acetaminophen (PERCOCET) 7.5-325 MG per tablet as needed.      rosuvastatin (CRESTOR) 20 MG tablet daily      Coenzyme Q10 (CO Q10) 200 MG CAPS Take by mouth daily (with breakfast)      calcium citrate-vitamin D (CITRICAL + D) 315-250 MG-UNIT TABS per tablet Take 2 tablets by mouth 2 times daily      Cholecalciferol (VITAMIN D3) 50 MCG (2000 UT) CAPS Take 1 capsule by mouth daily 90 capsule 3    albuterol sulfate HFA (PROVENTIL HFA) 108 (90 Base) MCG/ACT inhaler Inhale 2 puffs into the lungs every 6 hours as needed for Wheezing 3 Inhaler 3    lidocaine (LIDODERM) 5 % Place 1 patch onto the skin daily 12 hours on, 12 hours off. (Patient taking differently: Place 1 patch onto the skin as needed 12 hours on, 12 hours off.) 30 patch 0    aspirin 81 MG EC tablet Take 1 tablet by mouth daily 30 tablet 11    Multiple Vitamins-Minerals (MULTIVITAMIN PO) Take 1 tablet by mouth daily        No current facility-administered medications for this visit.       Allergies:  Ibuprofen, Lisinopril, Seasonal, and Talwin [pentazocine]    Social History:   Social History     Socioeconomic History    Marital status: Divorced     Spouse name: [name]    Number of children: 2    Years of education: 12    Highest education level: Not on file   Occupational History    Occupation: Retired   Tobacco Use    Smoking status: Former     Current packs/day: 0.00     Average packs/day: 0.5 packs/day for 40.0 years (20.0 ttl  pk-yrs)     Types: Cigarettes     Start date: 08/07/1972     Quit date: 08/07/2012     Years since quitting: 11.1    Smokeless tobacco: Never   Vaping Use    Vaping status: Never Used   Substance and Sexual Activity    Alcohol use: No    Drug use: No    Sexual activity: Not Currently     Partners: Male   Other Topics Concern  Not on file   Social History Narrative    Works as Conservation officer, nature at [employer]: Low Risk  (05/15/2020)    Overall Financial Resource Strain (CARDIA)     Difficulty of Paying Living Expenses: Not hard at all   Food Insecurity: No Food Insecurity (03/31/2023)    Hunger Vital Sign     Worried About Running Out of Food in the Last Year: Never true     Ran Out of Food in the Last Year: Never true   Transportation Needs: No Transportation Needs (03/31/2023)    PRAPARE - Therapist, art (Medical): No     Lack of Transportation (Non-Medical): No   Physical Activity: Not on file   Stress: Not on file   Social Connections: Not on file   Intimate Partner Violence: Unknown (12/25/2022)    Received from [employer] and [employer], [employer] traffic [employer] and [employer]    Interpersonal Safety     Feel physically or emotionally unsafe where currently live: Not on file     Harm by anyone: Not on file     Emotionally Harmed: Not on file   Housing Stability: Low Risk  (03/31/2023)    Housing Stability Vital Sign     Unable to Pay for Housing in the Last Year: No     Number of Places Lived in the Last Year: 1     Unstable Housing in the Last Year: No       Family History:   Family History   Problem Relation Age of Onset    Arthritis Mother     Cancer Mother     Depression Mother     High Blood Pressure Mother     Heart Disease Mother 88        MI     Ovarian Cancer Mother 60    Cancer Father 19        colon    Other Sister         OSA    Cancer Sister         lung liver    Other Brother         OSA    Sleep Apnea  Brother     Other Brother         OSA    Sleep Apnea Brother     Heart Disease Maternal Grandmother     Early Death Paternal Grandmother     Heart Disease Paternal Grandfather     Other Daughter         OSA    Diabetes Other     High Blood Pressure Other     Obesity Other        Review of Systems:    Constitutional: No fever, no chills, no lethargy, no weakness.  HEENT:  No headache,  sore throat.  Cardiac:  No chest pain, ++, orthopnea or PND.  Chest:              No cough, phlegm or wheezing.  Abdomen:  No abdominal pain, nausea or vomiting.  Neuro:                No focal weakness, abnormal movements orseizure like activity.  Skin:   No rashes, no itching.  GU:   No hematuria,, no dysuria, no flank pain.  Extremities:  +2 swelling  Objective:  BP (!) 151/62   Pulse 85   Wt (!) 143.1 kg (315 lb 6.4 oz)   BMI 52.49 kg/m     Physical Exam:  General appearance:Awake, alert, in no acute distress  Skin: warm and dry, no rash or erythema    Eyes: conjunctivae normal and sclera anicteric    ENT: :no swelling     Neck: without any JVD. No carotid bruits or thyromegaly.      Pulmonary: b/l rales     Cardiovascular:Normal S1 & S2, No S3 or  S4, No  Pericardial rub , No Murmur     Abdomen: soft nontender, bowel sounds present, no organomegaly,  No ascites      Extremities: no cyanosis, +1-2  b/l pitting edema    Labs:    CBC:   Lab Results   Component Value Date/Time    WBC 8.6 08/17/2023 03:04 PM    RBC 4.47 08/17/2023 03:04 PM    HGB 13.0 08/17/2023 03:04 PM    HCT 38.9 08/17/2023 03:04 PM    MCV 87.0 08/17/2023 03:04 PM    RDW 15.3 08/17/2023 03:04 PM    PLT 154 08/17/2023 03:04 PM    MPV 9.6 08/17/2023 03:04 PM      BMP:   Lab Results   Component Value Date/Time    NA 140 09/07/2023 02:15 PM    NA 142 08/17/2023 03:04 PM    NA 142 06/08/2023 03:13 PM    K 4.4 09/07/2023 02:15 PM    K 4.4 08/17/2023 03:04 PM    K 4.4 06/08/2023 03:13 PM    K 4.6 10/17/2019 06:07 AM    K 3.9 05/05/2017 03:21 AM    CL 100  09/07/2023 02:15 PM    CL 102 08/17/2023 03:04 PM    CL 102 06/08/2023 03:13 PM    CO2 27 09/07/2023 02:15 PM    CO2 28 08/17/2023 03:04 PM    CO2 28 06/08/2023 03:13 PM    BUN 21 09/07/2023 02:15 PM    BUN 16 08/17/2023 03:04 PM    BUN 13 06/08/2023 03:13 PM    CREATININE 1.6 09/07/2023 02:15 PM    CREATININE 1.4 08/17/2023 03:04 PM    CREATININE 1.2 06/08/2023 03:13 PM    GLUCOSE 98 09/07/2023 02:15 PM    GLUCOSE 191 08/17/2023 03:04 PM    GLUCOSE 101 06/08/2023 03:13 PM    CALCIUM 10.4 09/07/2023 02:15 PM    CALCIUM 9.2 08/17/2023 03:04 PM    CALCIUM 9.8 06/08/2023 03:13 PM      BNP:No results found for: "BNP"  PHOSPHORUS:    Lab Results   Component Value Date/Time    PHOS 3.8 08/21/2013 03:46 PM    PHOS 3.3 08/18/2013 08:13 AM    PHOS 3.5 08/17/2013 06:49 AM     MAGNESIUM:   Lab Results   Component Value Date/Time    MG 1.90 04/05/2023 04:41 AM     ALBUMIN:   No results found for: "LABALBU"    IRON:    Lab Results   Component Value Date/Time    IRON 93 03/26/2010 05:35 PM     IRON SATURATION:    No components found for: "LABIRON"    TIBC:    Lab Results   Component Value Date/Time    TIBC 397 03/26/2010 05:35 PM     FERRITIN:    Lab Results   Component Value Date/Time    FERRITIN 61.9 05/02/2009 05:40 PM  ANA:   Lab Results   Component Value Date    ANA Negative 07/26/2019       SPEP:   Lab Results   Component Value Date/Time    GAMGLOB 1.2 03/29/2017 03:33 PM     UPEP: No results found for: "TPU"   HEPBSAG:No results found for: "HEPBSAG"  HEPCAB:No results found for: "HEPCAB"  C3: No results found for: "C3"  C4: No results found for: "C4"  MPO ANCA: No results found for: "MPO" .  PR3 ANCA:  No results found for: "PR3"  PTH: No results found for: "PTH"    Urine Creatinine:    No results found for: "LABCREA"    Urine Eosinophils: No results found for: "UREO"  Urine Protein:  No results found for: "TPU"  Urinalysis:  U/A:   Lab Results   Component Value Date/Time    NITRU Negative 08/17/2023 03:04 PM    COLORU  Yellow 08/17/2023 03:04 PM    PHUR 7.5 08/17/2023 03:04 PM    PHUR 7.0 03/31/2023 04:01 PM    LABCAST 0-1 Hyaline 07/09/2013 02:09 AM    WBCUA 1 08/17/2023 03:04 PM    RBCUA 1 08/17/2023 03:04 PM    RBCUA NEGATIVE 02/13/2021 03:03 PM    MUCUS PRESENT 02/13/2021 03:03 PM    YEAST 0 11/25/2017 04:24 PM    BACTERIA Rare 08/17/2023 03:04 PM    CLARITYU Clear 08/17/2023 03:04 PM    LEUKOCYTESUR Negative 08/17/2023 03:04 PM    UROBILINOGEN 1.0 08/17/2023 03:04 PM    BILIRUBINUR Negative 08/17/2023 03:04 PM    BLOODU Negative 08/17/2023 03:04 PM    GLUCOSEU Negative 08/17/2023 03:04 PM    KETUA Negative 08/17/2023 03:04 PM         Radiology:  Reviewed as available.    Assessment/Plan:    CKD stage 3B sus CRS  Creat ~ 1.4   --> 1.6   No proteinuria of hemturia: risk of GN low   No proteinuria   Torsemide 20 mg bid   Off  Metfrmin   Off PPU   FLC SPEP- normal   Plan:  No need for RAASi  Considering G1DD and edema- increase torsemide but before- repeat labs in 10-14 days and die [place]        H/o CHF but EF improved, CAD  Still edematous   Keep  torsemide   Monitor BP   Low sodium diet  No NSAIDS     H/o adrenal nodule . CT scan in 2020  MRI- benign nodsule   Down the rpad- recheck renin/ Aldosterone     HTN - fluctuate but better controlled 130s/70s   BP reasonable - border line   Spironolactone renewed   IncreasedTorsemide for edema   Discussed low salt diet  No NSAIDS  Counseling done for diet and lifestyle changes.  Explained importance of compliance with medications     Shirlee More, MD  Nephrology     [facility] Office : [address] , [place] [address]  [facility] Office: [address], [place], [place] [address]  [facility] Office: [address], [place] [address].  [facility] Office: [address], [place] [address]  Office : [phone]  Fax :[phone]     Discussed w/ MD- Metformin discontinued

## 2023-09-14 NOTE — Progress Notes (Signed)
Formatting of this note is different from the original.  Subjective   Subjective:    Rachael Mays is a 67 year old female who presents today to for her annual med check.     PMH:   LHC in 03/31/23 w/ patents stents     This result has an attachment that is not available.     Left Ventricle: Normal left ventricular systolic function with a   visually estimated EF of 60 - 65%. EF by 2D Simpsons Biplane is 64%. Left   ventricle size is normal. Findings consistent with moderate concentric   hypertrophy. Normal wall motion. Grade I diastolic dysfunction with normal   LAP.    Aortic Valve: Mildly calcified cusps. Mild stenosis of the aortic   valve. AV mean gradient is 14 mmHg. AV area by continuity VTI is 1.6 cm2.     Mitral Valve: Mild annular calcification at the posterior leaflet.     Image quality is technically difficult. Contrast used: Definity.   Technically difficult study with poor endocardial visualization.     This result has an attachment that is not available.     Mild (<50%) stenosis in the right internal carotid artery.  Moderate,   heterogeneous and irregular plaque (proximal) in the right internal   carotid artery.     Mild (<50%) stenosis in the left internal carotid artery.  Moderate,   calcific and irregular plaque (proximal) in the left internal carotid   artery.    Normal antegrade flow involving the right vertebral artery.     Left vertebral artery was not visualized.     Multiphasic flow noted in the subclavian arteries.     Recommend follow up study in 12 months.     Vitals:    09/14/23 1220   BP: 136/62   Pulse: 69   Temp: 97.8 F (36.6 C)   SpO2: 99%   CKD  - Dr. Doristine Bosworth, f/u today   - Torsemide 20 mg BID   - Continue Imdur 30 mg daily.  - Continue diltiazem 240 mg daily per Dr. Berna Bue   - Spironolactone 25 mg daily mananged per Dr. Doristine Bosworth now   CREATININE   Date Value Ref Range Status   09/13/2023 1.54 (H) 0.60 - 1.20 mg/dL Final   16/09/9603 1.6 (H) 0.6 - 1.2 mg/dL Final   54/08/8118 1.5 (H)  0.6 - 1.2 mg/dL Final     ESTIMATED GFR   Date Value Ref Range Status   09/13/2023 37 (L) >59 mL/min/1.73 m2 Final     Comment:     Estimated GFR was calculated using the CKD-EPI cr (2021) equation refit without race.  The equation is recommended by the [employer] [facility].   07/29/2023 40 (L) >=60 mL/min/1.73 m2 Final     Comment:     (NOTE)  Estimated GFR was calculated using the CKD-EPIcr (2021) equation  refit without race. The equation is recommended by the [employer] [facility].  Tested at: [facility], [address] [employer] [address]      MICROALBUMIN, UR   Date Value Ref Range Status   04/28/2023 <12.0 mg/L Final   08/29/2022 <12.0 mg/L Final     MICROALBUMIN, RANDOM URINE   Date Value Ref Range Status   08/17/2023 <1.20 <2.0 mg/dL Final     VITAMIN D 25 OH   Date Value Ref Range Status   05/07/2023  52.6 30.0 - 150.0 ng/mL Final     Comment:     (NOTE)  Preferred: >= 30 ng/mL  Insufficient: 21-29 ng/mL  Deficient <= 20  ng/mL  Possible Toxicity: >150 ng/mL    Samples should not be taken from patients receiving therapy with high  biotin doses (i.e. > 5 mg/day) until at least 8 hours following the  last biotin administration.  Tested at: [facility] [address] [employer] [address]    57/32/2025 56.7 30.0 - 150.0 ng/mL Final     Comment:     (NOTE)  Preferred: >= 30 ng/mL  Insufficient: 21-29 ng/mL  Deficient <= 20  ng/mL  Possible Toxicity: >150 ng/mL    Samples should not be taken from patients receiving therapy with high  biotin doses (i.e. > 5 mg/day) until at least 8 hours following the  last biotin administration.  Tested at: [facility] [address] [employer] [address] [employer] [address]      HEMOGLOBIN   Date Value Ref Range Status   09/13/2023 13.3 12.0 - 15.2 g/dL Final   23/76/2831 51.7 12.0 - 16.0 g/dL Final     HEMATOCRIT   Date Value Ref Range Status   09/13/2023 40.2 36 - 46 % Final    08/17/2023 38.9 36.0 - 48.0 % Final   04/28/2023 41.9 34.0 - 45.0 % Final     Patient Active Problem List    Diagnosis Date Noted    Essential hypertension 07/24/2013     Priority: High     Hypertension  - Today's BP:   Vitals:    05/11/23 1733   BP: 128/70   Pulse:    Temp:    SpO2:      - Patient's blood pressure is currently well controlled.  - Current meds:     - Continue Imdur 30 mg daily.     - Continue diltiazem 240 mg daily     - Spironolactone 25 mg daily managed per Dr. Doristine Bosworth now   - If patient is able, record daily blood pressures at home prior to medications and bring log to next office visit.   - Discussed sodium restriction, maintaining ideal body weight and regular exercise program as physiologic means to achieve blood pressure control. The patient will strive towards this. It may be appropriate to lower BP with medications, while observing for therapeutic effect and if appropriate later, can discontinue medications if physiologic methods appear to be effective. The patient indicates understanding of these issues and agrees with the plan  ESTIMATED GFR   Date Value Ref Range Status   05/07/2023 42 (L) >=60 mL/min/1.73 m2 Final     Comment:     (NOTE)  Estimated GFR was calculated using the CKD-EPIcr (2021) equation  refit without race. The equation is recommended by the [employer] [facility].  Tested at: [facility] [address] [employer] [address]    37/09/6268 44 (L) >=60 mL/min/1.73 m2 Final     Comment:     (NOTE)  Estimated GFR was calculated using the CKD-EPIcr (2021) equation  refit without race. The equation is recommended by the [employer] [facility].  Tested at: [facility] [address] [employer] [address]    27/02/5008 44 (L) >59 mL/min/1.73 m2 Final     Comment:     Estimated GFR was calculated using the CKD-EPI cr (2021) equation refit without race.  The  equation is  recommended by the [employer] [facility].  Tested at [facility] [address] [address]         Binge eating disorder 05/11/2023     Priority: Low    Type 2 diabetes mellitus with diabetic polyneuropathy, without long-term current use of insulin (HCC) 07/01/2022     Priority: Low     Diabetes Management:   HEMOGLOBIN A1C   Date Value Ref Range Status   06/25/2022 5.7 (H) 4.2 - 5.6 % Final   07/09/2021 7.2 (H) 4.2 - 5.6 % Final   02/13/2021 7.1 (H) 4.2 - 5.6 % Final     HEMOGLOBIN A1C EXTERNAL LAB   Date Value Ref Range Status   01/04/2023 5.7 % Final     HEMOGLOBIN A1C   Date Value Ref Range Status   09/13/2023 6.8 (H) 4.2 - 5.6 % Final   05/07/2023 5.4 4.2 - 5.6 % Final   03/31/2023 5.5 See comment % Final     Comment:     Comment:  Diagnosis of Diabetes: > or = 6.5%  Increased risk of diabetes (Prediabetes): 5.7-6.4%  Glycemic Control: Nonpregnant Adults: <7.0%                    Pregnant: <6.0%      06/25/2022 5.7 (H) 4.2 - 5.6 % Final     HEMOGLOBIN A1C EXTERNAL LAB   Date Value Ref Range Status   01/04/2023 5.7 % Final     - Most current Hemoglobin A1c demonstrates improving diabetes control. Would try to further optimize diabetic regimen ( previously on Trulicity and Victoza) Therefore, I have recommended the following medication management.   - Current Medications:      - Trulicity 1.5 mg weekly      - Jardiance 10 mg daily  started in the last month      -Not on metformin due to kidney disease  10 year ASCVD Risk Score: 9.4 %  Currently prescribed statin medication: Yes  Currently prescribed ACE/ARB: no   ASA 81 mg daily: Yes  During today's appointment I discussed and educated the patient and what constitutes a low carbohydrate diet and the health benefits of adapting such a diet. They will begin implementing these ideas and we will monitor their weight and co-morbidities.  I discussed with the patient the importance of exercise, with a goal  of at least 30 minutes of exercise, at least 5 times weekly.  Extensive conversation today regarding the importance of checking her feet both visually and tactilely for any signs of skin breakdown. If she notices anything concerning. Rachael Mays will reach out to [place] to be seen/for further guidance.   Smoking Cessation  reports that she has quit smoking. Her smoking use included cigarettes. She has never used smokeless tobacco.  Last Eye Exam: No results found for: "RETINOPATHY"  Discussed the importance of having annual screening for diabetic retinopathy by an eye care professional.        History of melanoma 07/01/2022     Priority: Low     - History of superficial melanoma  -Status post resection by Dr. Sherrie Mustache on 12/29/2017  Thomes Dinning with dermatology every 6 months       Dyspepsia 07/01/2022     Priority: Low     - Continue omeprazole 40 mg daily  -Continue Pepcid 20 mg twice daily as needed  -Currently well controlled, continue to monitor  -Follow-up, if  not improving, consider H. pylori testing versus referral to GI due to history of gastric banding erosion       Stage 3a chronic kidney disease (CKD) (HCC) 03/16/2022     Priority: Low     - This is managed by nephrologist, Dr. Doristine Bosworth.  - She noted worsening in her renal function, which I think is secondary to the increase in Torsemide dose.  - She is currently taking Torsemide 30 mg daily.  - I recommended decreasing the 10 mg daily and reaching out to Dr. Doristine Bosworth and discussing his recommendations moving forward.  - She has a delicate fluid balance due to the history of CHF, and also CKD.  - Continue management as per nephrology  ESTIMATED GFR   Date Value Ref Range Status   05/07/2023 42 (L) >=60 mL/min/1.73 m2 Final     Comment:     (NOTE)  Estimated GFR was calculated using the CKD-EPIcr (2021) equation  refit without race. The equation is recommended by the [employer] [facility].  Tested at: [facility] [address] [employer] [address]    60/45/4098 44 (L) >=60 mL/min/1.73 m2 Final     Comment:     (NOTE)  Estimated GFR was calculated using the CKD-EPIcr (2021) equation  refit without race. The equation is recommended by the [employer] [facility].  Tested at: [facility] [address] [employer] [address] [employer] [address]    78/29/5621 44 (L) >59 mL/min/1.73 m2 Final     Comment:     Estimated GFR was calculated using the CKD-EPI cr (2021) equation refit without race.  The equation is recommended by the [employer] [facility].  Tested at [facility] [address] [address]         Class 3 body mass index (BMI) of 50.0 to 59.9  02/18/2021     Priority: Low     -Previously managed by Dr. Jeani Hawking, but he is reportedly leaving the practice, therefore she is establishing with a new weight management physician.  -Previously had good results with Mounjaro, but due to insurance reasons had to be discontinued.  Currently on Victoza without significant results.  Previously has been on Trulicity.  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  Wt Readings from Last 3 Encounters:   06/30/22 295 lb 3.2 oz (133.9 kg)   05/07/22 296 lb 3.2 oz (134.4 kg)   05/07/22 296 lb 3.2 oz (134.4 kg)        CAD in native artery 01/31/2021     Priority: Low     -Managed by Dr. [facility], Dr. Berna Bue   -Status post PTCA LAD in April 2017, subsequent cath in 02/2021 demonstrated patent stent  -Increase to rosuvastatin 40 mg daily  -Ranexa 500 mg twice daily   -Nitroglycerin as needed, once every couple months   - ASA 81 mg daily      Mixed hyperlipidemia 01/01/2021     Priority: Low     -  Increase to Rosuvastatin (Crestor) 40 mg daily  - The 10-year  ASCVD risk score (Arnett DK, et al., 2019) is: 9.5%    Values used to calculate the score:      Age: 42 years      Sex: Female      Is Non-Hispanic African American: No      Diabetic: Yes      Tobacco smoker: No      Systolic Blood Pressure: 122 mmHg      Is BP treated: Yes      HDL Cholesterol: 46 mg/dL      Total Cholesterol: 150 mg/dL   - 1.6%  - During today's appointment I discussed and educated the patient and what constitutes a low carbohydrate diet and the health benefits of adapting such a diet. They will begin implementing these ideas and we will monitor their weight and co-morbidities.  - I discussed with the patient the importance of exercise, with a goal of at least 30 minutes of exercise, at least 5 times weekly.       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  There is normal left ventricular wall thickness.  Left ventricular systolic function is normal. (XWRU>/=04%)  Overall left ventricular ejection fraction is estimated to be 60-65%.  Normal Diastolic Function.  No significant valvular abnormalities identified.    - Currently well controlled  - No significant LEE on exam   - Denies dyspnea  - Continue to monitor  - BP controlled      Obstructive sleep apnea 09/18/2015     Priority: Low     Formatting of this  note might be different from the original.  Updating Deprecated Diagnoses    Last Assessment & Plan:   Formatting of this note might be different from the original.  Reviewed compliance download with pt.  Supplies and parts as needed for her machine.  These are medically necessary.  Continue medications per her PCP and other physicians. Limit caffeine use after 3pm.  Encouraged her to work on weight loss through diet and exercise.  Diagnoses of Pulmonary hypertension, Morbid obesity with body mass index (BMI) of 45.0 to 49.9 in adult [facility]), Coronary artery disease due to lipid rich plaque, and Chronic diastolic congestive heart failure (HCC) were pertinent to this visit.  The chronic medical conditions listed are directly related to the primary diagnosis listed above.  The management of the primary diagnosis affects the secondary diagnosis and vice versa.     Adrenal mass, left (HCC) 01/12/2015     Priority: Low     - Managed by nephrology  -MRI abdomen October 2023 shows stable adrenal adenoma  -Per nephrology no further work-up at this time     Fibromyalgia 10/22/2008  Priority: Low     PSH:   Past Surgical History:   Procedure Laterality Date    BREAST BIOPSY      BREAST SURGERY      biopsy (negative)    CERVICAL SPINE SURGERY      CHOLECYSTECTOMY WITH/WITHOUT COMMON DUCT EXPLORATION      CORONARY ANGIOPLASTY WITH STENT PLACEMENT  03/2019    stent    HYSTERECTOMY      LAPAROSCOPIC GASTRIC BANDING      placed 2010, removed 2015     Family Hx:   Family History   Problem Relation Age of Onset    Heart Attack Mother     Ovarian cancer Mother     Hypertension Mother     High Cholesterol Mother     Thyroid Disease Sister     Diabetes Maternal Grandfather     Heart Attack Paternal Grandmother     Breast cancer Neg Hx      Allergies:   Allergies   Allergen Reactions    Pentazocine Other (See Comments)     dizzy     Ibuprofen Unknown     Contraindicated per Nephrologist    Lisinopril Confusion, Dizziness and Other (See  Comments)    Morphine Itch    Talwin [Pentazocine Lactate] Other (See Comments)     hallucinations     Medications:     Current Outpatient Medications:     JARDIANCE 10 MG TABS, Take 10 mg by mouth daily., Disp: , Rfl:     torsemide (DEMADEX) 20 MG TABS, Take one tablets in the morning  one tablet in the evening, Disp: , Rfl:     dilTIAZem (CARDIZEM CD) 240 MG CP24, Take 240 mg by mouth daily., Disp: , Rfl:     tirzepatide (MOUNJARO) 2.5 mg/0.5 mL subcutaneous injection pen, Inject 2.5 mg under the skin every 7 days., Disp: 2 mL, Rfl: 0    tirzepatide (MOUNJARO) 5 mg/0.5 mL subcutaneous injection pen, Inject 5 mg under the skin every 7 days., Disp: 6 mL, Rfl: 0    famotidine (PEPCID) 20 mg TABS, Take 1 tablet by mouth 2 (two) times daily., Disp: 180 tablet, Rfl: 1    rosuvastatin (CRESTOR) 40 MG TABS, Take 1 tablet by mouth nightly., Disp: 90 tablet, Rfl: 2    montelukast (SINGULAIR) 10 MG TABS, Take 1 tablet by mouth daily., Disp: 90 tablet, Rfl: 1    fluocinonide (LIDEX) 0.05 % SOLN, APPLY SPARINGLY TO THE BACK OF THE SCALP DAILY AS NEEDED FOR ITCHING, Disp: , Rfl:     isosorbide mononitrate (IMDUR) 30 MG TB24, Take 30 mg by mouth daily., Disp: , Rfl:     fluticasone (FLONASE) 50 mcg/act nasal spray, Use 1 spray in each nostril daily., Disp: 16 g, Rfl: 0    Magnesium 250 MG TABS, Take 350 mg by mouth., Disp: , Rfl:     ranolazine (RANEXA) 500 MG TB12, Take 1 tablet by mouth 2 (two) times daily., Disp: 60 tablet, Rfl: 1    oxycodone-acetaminophen (PERCOCET) 7.5-325 MG TABS, Take 1 tablet by mouth every 6 (six) hours as needed (pain)., Disp: , Rfl:     aspirin 81 MG TBEC, Take 81 mg by mouth daily., Disp: , Rfl:     Coenzyme Q10 (COQ10) 200 MG CAPS, Take 200 mg by mouth daily., Disp: , Rfl:     Calcium Citrate-Vitamin D 315-250 MG-UNIT TABS, Take 2 tablets by mouth 2 (two) times daily., Disp: , Rfl:  nitroGLYCERIN (NITROSTAT) 0.4 MG SUBL, 1 tablet by Sublingual route every 5 (five) minutes as needed., Disp: 25  tablet, Rfl: 2    spironolactone (ALDACTONE) 25 MG TABS, Take 25 mg by mouth daily., Disp: , Rfl:     gabapentin (NEURONTIN) 400 MG CAPS, Take 800 mg by mouth nightly., Disp: , Rfl:   Immunization History   Administered Date(s) Administered    COVID-19 mRNA Vaccine 100 mcg dose (Moderna 18+) 03/07/2020, 04/04/2020    COVID-19 mRNA Vaccine tris-sucrose (Pfizer COMIRNATY 12+) Wallace Cullens Cap 03/05/2021    Influenza Vaccine, Inactivated Trivalent High-Dose PF (Fluzone HD) 09/14/2023    Influenza Vaccine, Inactivated, Quadrivalent PF 09/15/2019, 10/07/2021    Pneumococcal Conjugate (PCV13) Prevnar 13 11/03/2016    Pneumococcal Polysaccharide (PPV23) Pneumovax-23 07/13/2019    Tdap (Tetanus, Diphtheria & Pertussis) 11/07/2011, 10/16/2019    Zoster Vaccine, Recombinant 07/09/2021, 10/07/2021     Vaccinations: Patient reports that they are up to date on childhood immunizations. Last TDAP: 10/2019.    Covid 19 Vaccine: Covid-19 primary series and at least 1 booster, but not currently UTD    Social:  Occupation: REtired   Tobacco:   Social History     Tobacco Use   Smoking Status Former    Types: Cigarettes   Smokeless Tobacco Never   Tobacco Comments    quit 2013     Alcohol:    reports that she does not currently use alcohol.    Recreational Substances:    reports no history of drug use.    Sexual Hx  History   Sexual Activity    Sexual activity: Not on file     Female Patients  - Partial hysterectomy, menorrhagia   - Upcoming visit with Scot Jun, CNP   - Children: 2, C-Section  - Last Pap Smear: No results found for: "G"  - Patient denies a history of abnormal Pap Smear.   - Contraception: status post hysterectomy  - Family History of breast/cervical/uterine cancer: admits, Mom: ovarian cancer     Health Maintenance   Topic Date Due    Eye Exam  Never done    PAP Screening  Never done    RSV Vaccine (60+ or Pregnant) (1 - 1-dose 60+ series) Never done    Foot Exam  12/07/2022    DEXA SCAN  Never done    Wellness 65 and over   Never done    COVID-19 Vaccine (4 - 2023-24 season) 08/08/2023    Mammogram Screening  08/27/2023    HB A1C  03/13/2024    Pneumococcal 65+ (3 of 3 - PPSV23 or PCV20) 07/12/2024    Microalb/Creat Ratio  08/16/2024    CHOLESTEROL DIABETIC  09/12/2024    GFR  09/12/2024    COLONOSCOPY 5 YEARS  04/18/2026    DTap,Tdap,and Td (3 - Td or Tdap) 10/15/2029    Shingrix  Completed    Influenza Vaccine  Completed    Hepatitis C Screening  Completed    HPV  Aged Out    Meningococcal conjugate valent 4 (MCV4)  Aged Out    RSV Immunization (<20 months)  Aged Out    Colonoscopy  Discontinued     ________________________________________        Past Medical History:   Diagnosis Date    Congestive heart failure (CHF) (HCC)     Coronary atherosclerosis     DVT (deep venous thrombosis) (HCC)     Esophageal reflux     Hyperlipidemia     Hypertension  Pulmonary hypertension (HCC) 03/31/2016     Past Surgical History:   Procedure Laterality Date    BREAST BIOPSY      BREAST SURGERY      biopsy (negative)    CERVICAL SPINE SURGERY      CHOLECYSTECTOMY WITH/WITHOUT COMMON DUCT EXPLORATION      CORONARY ANGIOPLASTY WITH STENT PLACEMENT  03/2019    stent    HYSTERECTOMY      LAPAROSCOPIC GASTRIC BANDING      placed 2010, removed 2015       History   Alcohol Use Not Currently     Comment: on rare occasion     Social History     Tobacco Use   Smoking Status Former    Types: Cigarettes   Smokeless Tobacco Never   Tobacco Comments    quit 2013     Counseling given: Not Answered  Tobacco comments: quit 2013    Family History   Problem Relation Age of Onset    Heart Attack Mother     Ovarian cancer Mother     Hypertension Mother     High Cholesterol Mother     Thyroid Disease Sister     Diabetes Maternal Grandfather     Heart Attack Paternal Grandmother     Breast cancer Neg Hx      Allergies   Allergen Reactions    Pentazocine Other (See Comments)     dizzy     Ibuprofen Unknown     Contraindicated per Nephrologist    Lisinopril Confusion,  Dizziness and Other (See Comments)    Morphine Itch    Talwin [Pentazocine Lactate] Other (See Comments)     hallucinations     Outpatient Medications Marked as Taking for the 09/14/23 encounter (Office Visit) with Maximino Sarin, DO   Medication Sig Dispense Refill    JARDIANCE 10 MG TABS Take 10 mg by mouth daily.      torsemide (DEMADEX) 20 MG TABS Take one tablets in the morning  one tablet in the evening      dilTIAZem (CARDIZEM CD) 240 MG CP24 Take 240 mg by mouth daily.      tirzepatide [facility]) 2.5 mg/0.5 mL subcutaneous injection pen Inject 2.5 mg under the skin every 7 days. 2 mL 0    tirzepatide (MOUNJARO) 5 mg/0.5 mL subcutaneous injection pen Inject 5 mg under the skin every 7 days. 6 mL 0    famotidine (PEPCID) 20 mg TABS Take 1 tablet by mouth 2 (two) times daily. 180 tablet 1    rosuvastatin (CRESTOR) 40 MG TABS Take 1 tablet by mouth nightly. 90 tablet 2    montelukast (SINGULAIR) 10 MG TABS Take 1 tablet by mouth daily. 90 tablet 1    fluocinonide (LIDEX) 0.05 % SOLN APPLY SPARINGLY TO THE BACK OF THE SCALP DAILY AS NEEDED FOR ITCHING      isosorbide mononitrate (IMDUR) 30 MG TB24 Take 30 mg by mouth daily.      fluticasone (FLONASE) 50 mcg/act nasal spray Use 1 spray in each nostril daily. 16 g 0    Magnesium 250 MG TABS Take 350 mg by mouth.      ranolazine (RANEXA) 500 MG TB12 Take 1 tablet by mouth 2 (two) times daily. 60 tablet 1    oxycodone-acetaminophen (PERCOCET) 7.5-325 MG TABS Take 1 tablet by mouth every 6 (six) hours as needed (pain).      aspirin 81 MG TBEC Take 81 mg by mouth daily.  Coenzyme Q10 (COQ10) 200 MG CAPS Take 200 mg by mouth daily.      Calcium Citrate-Vitamin D 315-250 MG-UNIT TABS Take 2 tablets by mouth 2 (two) times daily.      nitroGLYCERIN (NITROSTAT) 0.4 MG SUBL 1 tablet by Sublingual route every 5 (five) minutes as needed. 25 tablet 2     Review of Systems   Constitutional:  Negative for chills and fever.   Respiratory:  Negative for cough, chest tightness  and shortness of breath.    Cardiovascular:  Positive for leg swelling. Negative for chest pain and palpitations.   Gastrointestinal:  Negative for abdominal pain, constipation, diarrhea, nausea and vomiting.   Skin:  Negative for rash.   Neurological:  Positive for dizziness. Negative for tremors, seizures, syncope, facial asymmetry, speech difficulty, weakness, light-headedness and numbness.   Psychiatric/Behavioral:  Negative for dysphoric mood. The patient is not nervous/anxious.      Vitals:    09/14/23 1220   BP: 136/62   Pulse: 69   Temp: 97.8 F (36.6 C)   TempSrc: Skin   SpO2: 99%   Weight: (!) 316 lb (143.3 kg)   Height: 65" (165.1 cm)     Body mass index is 52.59 kg/m.         ________________________________________    Objective   Objective:  Physical Exam  Constitutional:       General: She is not in acute distress.     Appearance: She is well-developed. She is not diaphoretic.   Eyes:      General: No visual field deficit.  Cardiovascular:      Rate and Rhythm: Normal rate and regular rhythm.      Heart sounds: No murmur heard.     No friction rub. No gallop.   Pulmonary:      Effort: Pulmonary effort is normal.      Breath sounds: Normal breath sounds. No wheezing, rhonchi or rales.   Abdominal:      General: Bowel sounds are normal. There is no distension.      Palpations: Abdomen is soft.      Tenderness: There is no abdominal tenderness.   Musculoskeletal:      Right lower leg: Edema (Trace-1+) present.      Left lower leg: Edema (Trace-1+) present.   Neurological:      Mental Status: She is alert and oriented to person, place, and time.      Cranial Nerves: Cranial nerves 2-12 are intact. No dysarthria or facial asymmetry.      Sensory: Sensation is intact.      Motor: Motor function is intact. No weakness, tremor, abnormal muscle tone, seizure activity or pronator drift.      Coordination: Romberg sign negative. Coordination normal. Finger-Nose-Finger Test and Heel to [facility] Test normal. Rapid  alternating movements normal.      Gait: Gait is intact.     Procedures    Chronic Disease Management    ________________________________________    Assessment & Plan:    METTE SOUTHGATE is a 67 year old female who presents today to for her annual med check.     Diagnoses and all orders for this visit:    1. Adrenal mass, left (HCC) (Primary)  Overview:  - Managed by nephrology  -MRI abdomen October 2023 shows stable adrenal adenoma  -Per nephrology no further work-up at this time    2. Anxiety and depression  Overview:  - Currently well controlled  - No longer  taking lexapro or Wellbutrinx   - Continue to monitor     3. CAD in native artery  Overview:  -Managed by cardiology, Dr. Berna Bue with [facility]  -Status post PTCA LAD in April 2017, subsequent cath in 02/2021 and 03/2023 demonstrating patent stents  -Admission for acute HF w/ pEF in April 2024  -Echocardiogram September 2024   -EF: 60-65%   -Moderate concentric LVH   -Grade 1 diastolic dysfunction   -Mild aortic stenosis  -Continue current medications per cardiology:   -Ranexa 500 mg twice daily   -Torsemide 20 mg twice daily (managed by nephrology)   -Diltiazem 240 mg daily   -Isosorbide mononitrate 30 mg daily   -Spironolactone 25 mg daily   -Rosuvastatin 40 mg daily   -Aspirin 81 mg daily  -Nitroglycerin as needed  -Delicate fluid balance between heart failure and CKD.  Recent decline in renal function, discussed the importance of close follow-up with cardiology and nephrology.    4. Chronic pain disorder  Overview:  - Chronic low back pain with radiculopathy  -Managed by [facility]  -Oxycodone-acetaminophen 7.5-325 mg every 6 hours as needed    5. Class 3 severe obesity with serious comorbidity and body mass index (BMI) of 50.0 to 59.9 in adult, unspecified obesity type (HCC)  Overview:  -Status post gastric banding in 2010, subsequently removed in 2015 secondary to erosion  -GLP-1 management due to diabetes, plan for change from Trulicity to  [facility] due to worsening in A1c control  BMI Readings from Last 4 Encounters:   09/14/23 52.59 kg/m   08/03/23 53.92 kg/m   05/20/23 53.42 kg/m   05/11/23 52.25 kg/m     Wt Readings from Last 3 Encounters:   09/14/23 (!) 316 lb (143.3 kg)   08/03/23 324 lb (147 kg)   05/20/23 321 lb (145.6 kg)     - During today's appointment I discussed and educated the patient and what constitutes a low carbohydrate diet and the health benefits of adapting such a diet. They will begin implementing these ideas and we will monitor their weight and co-morbidities.  - I discussed with the patient the importance of exercise, with a goal of at least 30 minutes of exercise, at least 5 times weekly.    6. 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  Stop omeprazole 40 mg daily, may take as needed.  -Start Pepcid 20 mg twice daily  -Monitor closely  -Notes that she follows with Dr. Dollene Primrose with gastro health, will attempt to obtain records and recent EGDs    Orders:  -     famotidine (PEPCID) 20 mg TABS; Take 1 tablet by mouth 2 (two) times daily.  Dispense: 180 tablet; Refill: 1    7. Essential hypertension  Overview:  Hypertension  - Today's BP:   Vitals:    09/14/23 1220   BP: 136/62   Pulse: 69   Temp: 97.8 F (36.6 C)   SpO2: 99%     BP Readings from Last 3 Encounters:   09/14/23 136/62   08/03/23 130/74   05/20/23 128/68     - Patient's blood pressure is currently well controlled.  - Current meds:     - Continue  diltiazem 240 mg daily     - Continue Spironolactone 25 mg daily     -Continue  Torsemide 20 mg twice daily     - Continue  Imdur 30 mg daily  - If patient is able, record daily blood pressures at home prior to medications and bring log to next office visit.   - Discussed sodium restriction, maintaining ideal body weight and regular exercise program as  physiologic means to achieve blood pressure control. The patient will strive towards this. It may be appropriate to lower BP with medications, while observing for therapeutic effect and if appropriate later, can discontinue medications if physiologic methods appear to be effective. The patient indicates understanding of these issues and agrees with the plan  ESTIMATED GFR   Date Value Ref Range Status   09/13/2023 37 (L) >59 mL/min/1.73 m2 Final     Comment:     Estimated GFR was calculated using the CKD-EPI cr (2021) equation refit without race.  The equation is recommended by the [employer] [facility].   07/29/2023 40 (L) >=60 mL/min/1.73 m2 Final     Comment:     (NOTE)  Estimated GFR was calculated using the CKD-EPIcr (2021) equation  refit without race. The equation is recommended by the [employer] [facility].  Tested at: [employer], [address] [employer] [address]    60/45/4098 42 (L) >=60 mL/min/1.73 m2 Final     Comment:     (NOTE)  Estimated GFR was calculated using the CKD-EPIcr (2021) equation  refit without race. The equation is recommended by the [employer] [facility].  Tested at: [employer], [address] [employer] [address]      8. History of melanoma  Overview:  - History of superficial melanoma  -Status post resection by Dr. Sherrie Mustache on 12/29/2017  -Follows with dermatology every 6 months    9. Mixed hyperlipidemia  Overview:  - Continue Rosuvastatin (Crestor) 40 mg daily  - The 10-year ASCVD risk score (Arnett DK, et al., 2019) is: 13%    Values used to calculate the score:      Age: 27 years      Sex: Female      Is Non-Hispanic African American: No      Diabetic: Yes      Tobacco smoker: No      Systolic Blood Pressure: 136 mmHg      Is BP treated: Yes      HDL Cholesterol: 52 mg/dL      Total Cholesterol: 133 mg/dL   - Recommended  following a low cholesterol diet (choosing lean proteins (chicken and fish), limiting red meats, and limiting butter/oil use) and exercising for 30 minutes, 5x weekly or 150 minutes weekly.   CHOLESTEROL   Date Value Ref Range Status   09/13/2023 133 <200 mg/dL Final   78/29/5621 308 0 - 199 mg/dL Final     TRIGLYCERIDE   Date Value Ref Range Status   09/13/2023 107 <150 mg/dL Final     Comment:     Reference Interval:  Fasting  <150 mg/dL  Non-Fasting <657 mg/dL    84/69/6295 284 <132 mg/dL Final     Comment:     (NOTE)  < 150         Normal  150 - 199    Borderline High  200 - 499    High   >= 500       Very High  TRIGLYCERIDES   Date Value Ref Range Status   04/01/2023 180 (H) 0 - 150 mg/dL Final     HDL CHOLESTEROL   Date Value Ref Range Status   09/13/2023 52 >50 mg/dL Final   56/21/3086 54 40 - 60 mg/dL Final     Comment:     An HDL cholesterol less than 40 mg/dL is low and  constitutes a coronary heart disease risk factor.  An HDL cholesterol greater than 60 mg/dL is a  negative risk factor for coronary heart disease.      LDL CHOLESTEROL (CALCULATED)   Date Value Ref Range Status   09/13/2023 60 <130 mg/dL Final     Comment:     Interpretive Guidelines:       <100      Optimal        100-129  Near Optimal        130-159  Borderline High       >159      High    06/25/2022 63 <100 mg/dL Final     LDL CHOLESTEROL   Date Value Ref Range Status   04/01/2023 47 <100 mg/dL Final     10. Stage 3a chronic kidney disease (CKD) (HCC)  Overview:  - Managed by nephrology, Dr. Doristine Bosworth, upcoming office visit later today.  -Progressive decline in renal function over the last 6 months  -Discussed difficult and somewhat quintessential fluid balance situation between CHF and CKD  -Continue medication management per nephrology:   -Torsemide 20 mg twice daily   -Jardiance 10 mg daily   -Spironolactone 25 mg daily  CREATININE   Date Value Ref Range Status   09/13/2023 1.54 (H) 0.60 - 1.20 mg/dL Final   57/84/6962 1.6 (H) 0.6 -  1.2 mg/dL Final   95/28/4132 1.5 (H) 0.6 - 1.2 mg/dL Final     ESTIMATED GFR   Date Value Ref Range Status   09/13/2023 37 (L) >59 mL/min/1.73 m2 Final     Comment:     Estimated GFR was calculated using the CKD-EPI cr (2021) equation refit without race.  The equation is recommended by the [employer] [facility].   07/29/2023 40 (L) >=60 mL/min/1.73 m2 Final     Comment:     (NOTE)  Estimated GFR was calculated using the CKD-EPIcr (2021) equation  refit without race. The equation is recommended by the [employer] [facility].  Tested at: [facility] [employer] [facility] [employer], [address] [employer] [address]      MICROALBUMIN, UR   Date Value Ref Range Status   04/28/2023 <12.0 mg/L Final   08/29/2022 <12.0 mg/L Final     MICROALBUMIN, RANDOM URINE   Date Value Ref Range Status   08/17/2023 <1.20 <2.0 mg/dL Final     VITAMIN D 25 OH   Date Value Ref Range Status   05/07/2023 52.6 30.0 - 150.0 ng/mL Final     Comment:     (NOTE)  Preferred: >= 30 ng/mL  Insufficient: 21-29 ng/mL  Deficient <= 20  ng/mL  Possible Toxicity: >150 ng/mL    Samples should not be taken from patients receiving therapy with high  biotin doses (i.e. > 5 mg/day) until at least 8 hours following the  last biotin administration.  Tested at: [facility] [employer] [facility] [employer], [address] [address]    66/44/0347 56.7 30.0 - 150.0 ng/mL Final     Comment:     (  NOTE)  Preferred: >= 30 ng/mL  Insufficient: 21-29 ng/mL  Deficient <= 20  ng/mL  Possible Toxicity: >150 ng/mL    Samples should not be taken from patients receiving therapy with high  biotin doses (i.e. > 5 mg/day) until at least 8 hours following the  last biotin administration.  Tested at: [facility] [employer] [facility] [employer], [address] [employer] [address] [employer] [address]      HEMOGLOBIN   Date Value Ref Range Status   09/13/2023 13.3 12.0 - 15.2 g/dL Final   21/30/8657 84.6 12.0 - 16.0 g/dL Final     HEMATOCRIT   Date Value Ref Range Status    09/13/2023 40.2 36 - 46 % Final   08/17/2023 38.9 36.0 - 48.0 % Final   04/28/2023 41.9 34.0 - 45.0 % Final     11. Type 2 diabetes mellitus with diabetic polyneuropathy, without long-term current use of insulin (HCC)  Overview:  Diabetes Management:   HEMOGLOBIN A1C   Date Value Ref Range Status   09/13/2023 6.8 (H) 4.2 - 5.6 % Final   05/07/2023 5.4 4.2 - 5.6 % Final   03/31/2023 5.5 See comment % Final     Comment:     Comment:  Diagnosis of Diabetes: > or = 6.5%  Increased risk of diabetes (Prediabetes): 5.7-6.4%  Glycemic Control: Nonpregnant Adults: <7.0%                    Pregnant: <6.0%        MICROALB/CREAT RATIO   Date Value Ref Range Status   04/28/2023 See Comment mg/g CREAT Final     Comment:     (NOTE)  Because the albumin level is below the level of detection in this  urine specimen, the laboratory is unable to calculate a reliable  albumin/creatinine ratio.    Microalbuminuria is unlikely if the urine albumin concentration is  less than 20-30 mg/L in a random specimen.  Tested at: [facility] [employer] [facility] [employer], [address] [address]    28/41/3244 See Comment mg/g CREAT Final     Comment:     (NOTE)  Because the albumin level is below the level of detection in this  urine specimen, the laboratory is unable to calculate a reliable  albumin/creatinine ratio.    Microalbuminuria is unlikely if the urine albumin concentration is  less than 20-30 mg/L in a random specimen.  Tested at: [facility] [employer], [address] [employer] [address]    25/36/6440 See Comment mg/g CREAT Final     Comment:     (NOTE)  Because the albumin level is below the level of detection in this  urine specimen, the laboratory is unable to calculate a reliable  albumin/creatinine ratio.    Microalbuminuria is unlikely if the urine albumin concentration is  less than 20-30 mg/L in a random specimen.  Tested at: [facility] [employer], [address] [address]      - Most current Hemoglobin A1c demonstrates  significant  worsening in A1c control, despite recently starting on Jardiance.  Therefore, it does not appear that the Trulicity is working well for her, so I recommended a change to [employer].  Has had dizziness associated with the dulaglutide, chang to [facility].  - Therefore, I have recommended the following medication management:        - Start Tirzepatide [facility]) 2.5 mg weekly x 4 weeks and the increase to Tirzepatide (Mounjaro) 5 mg weekly until next office visit        -  Continue Empagliflozin (Jardiance) 10 mg daily  - 10 year ASCVD Risk Score: 13 %  - Currently prescribed statin medication: Yes  - Currently prescribed ACE/ARB: no   - ASA 81 mg daily: Yes  - During today's appointment I discussed and educated the patient and what constitutes a low carbohydrate diet and the health benefits of adapting such a diet. They will begin implementing these ideas and we will monitor their weight and co-morbidities.  - I discussed with the patient the importance of exercise, with a goal of at least 30 minutes of exercise, at least 5 times weekly.  - Extensive conversation today regarding the importance of checking her feet both visually and tactilely for any signs of skin breakdown. If she notices anything concerning. Rachael Mays will reach out to [place] to be seen/for further guidance.  - Discussed the importance of having annual screening for diabetic retinopathy by an eye care professional.   Last Eye Exam: No results found for: "RETINOPATHY"  - Discussed signs/symptoms associated with hypoglycemia. If she experiences these symptoms they are to treat with a concentrated sugary substance like juice or candy, check their blood sugar, and immediately call the office of 911 if severe.     Orders:  -     tirzepatide (MOUNJARO) 2.5 mg/0.5 mL subcutaneous injection pen; Inject 2.5 mg under the skin every 7 days.  Dispense: 2 mL; Refill: 0  -     tirzepatide (MOUNJARO) 5 mg/0.5 mL subcutaneous injection pen; Inject 5 mg under the skin every 7  days.  Dispense: 6 mL; Refill: 0  -     Cancel: GLUCOSE BLOOD TEST  -     Cancel: COLLECTION, CAPILLARY BLOOD SPECIMEN  -     Cancel: GLUCOSE BLOOD TEST  -     POCT BEDSIDE GLUCOSE    12. Need for influenza vaccination  -     INFLUENZA VACCINE HIGH-DOSE TRIVALENT IM (FLUZONE HIGH-DOSE)    13. Breast cancer screening by mammogram  -     MAM SCR BILAT TOMO; Future    14. Dizzinesses  Overview:  -Chronic intermittent dizziness, described as unsteadiness and room spinning  -No acute neurologic symptoms outside of the dizziness.  Neurologic examination today is unremarkable.  No findings consistent with posterior circulation pathology.  -Carotid ultrasounds negative for significant obstructive lesion in September 2024  -Echocardiogram with mild aortic stenosis in September 2024  -Recommended treating allergic rhinitis type symptoms with Flonase  -Polypharmacy as a potential etiology for the dizziness as well given the Co prescribing of opioids, with gabapentin  -No evidence of hypotension, denies consistent orthostatic symptoms  -Could be secondary to Trulicity due to association with hypotension.  Blood pressure is normal today in the office.  Notes that she did experience this on the [facility].  -Given the chronic intermittent issue, without definitive etiology, I recommended evaluation with ENT for further assistance in managing this issue.  -Discussed evaluation for persistent dizziness or emergent evaluation if associated with other neurologic symptoms    Orders:  -     AMB REFERRAL TO OTOLARYNGOLOGY; Future    15. Chronic diastolic congestive heart failure [facility])  Overview:  -Managed by cardiology, Dr. Berna Bue with [facility]  -Status post PTCA LAD in April 2017, subsequent cath in 02/2021 and 03/2023 demonstrating patent stents  -Admission for acute HF w/ pEF in April 2024  -Echocardiogram September 2024   -EF: 60-65%   -Moderate concentric LVH   -Grade 1 diastolic dysfunction   -Mild aortic  stenosis  -Continue  current medications per cardiology:   -Ranexa 500 mg twice daily   -Torsemide 20 mg twice daily (managed by nephrology)   -Diltiazem 240 mg daily   -Isosorbide mononitrate 30 mg daily   -Spironolactone 25 mg daily   -Rosuvastatin 40 mg daily   -Aspirin 81 mg daily  -Nitroglycerin as needed  -Delicate fluid balance between heart failure and CKD.  Recent decline in renal function, discussed the importance of close follow-up with cardiology and nephrology.    16. Dysuria  -     URINE DIP  -     CULTURE URINE; Future    -Concern for potential UTI due to SGLT2.  Initial urine dip was negative for infection.  Sent for culture to rule out definitive presence given worsening in renal function and dysuria type symptoms.  UTI would also potentially potentiate dizziness.    Kandie was seen today for physical.    Diagnoses and all orders for this visit:    Adrenal mass, left (HCC)    Anxiety and depression    CAD in native artery    Chronic pain disorder    Class 3 severe obesity with serious comorbidity and body mass index (BMI) of 50.0 to 59.9 in adult, unspecified obesity type (HCC)    Dyspepsia  -     famotidine (PEPCID) 20 mg TABS; Take 1 tablet by mouth 2 (two) times daily.    Essential hypertension    History of melanoma    Mixed hyperlipidemia    Stage 3a chronic kidney disease (CKD) (HCC)    Type 2 diabetes mellitus with diabetic polyneuropathy, without long-term current use of insulin (HCC)  -     tirzepatide (MOUNJARO) 2.5 mg/0.5 mL subcutaneous injection pen; Inject 2.5 mg under the skin every 7 days.  -     tirzepatide (MOUNJARO) 5 mg/0.5 mL subcutaneous injection pen; Inject 5 mg under the skin every 7 days.  -     Cancel: GLUCOSE BLOOD TEST  -     Cancel: COLLECTION, CAPILLARY BLOOD SPECIMEN  -     Cancel: GLUCOSE BLOOD TEST  -     POCT BEDSIDE GLUCOSE    Need for influenza vaccination  -     INFLUENZA VACCINE HIGH-DOSE TRIVALENT IM (FLUZONE HIGH-DOSE)    Breast cancer screening by mammogram  -     MAM SCR BILAT  TOMO; Future    Dizzinesses  -     AMB REFERRAL TO OTOLARYNGOLOGY; Future    Chronic diastolic congestive heart failure (HCC)    Dysuria  -     URINE DIP  -     CULTURE URINE; Future    - Kyarra  completed the below blood work prior to the office visit. Those results were reviewed with the patient in detail. Please see the result note and office visit assessment and plan, if appropriate, for further discussion.     Office Visit on 09/14/2023   Component Date Value Ref Range Status    GLUCOSE,TPEC 09/14/2023 2,000  0 - 2,000 Final    BILIRUBIN,TPEC 09/14/2023 Negative  1+ small - 3+ large Final    KETONES,TPEC 09/14/2023 Negative  5-trace - 160 - large Final    SPECIFIC GRAVITY,TPEC 09/14/2023 1.015 (A)  1.000 - 1.030 Final    BLOOD,TPEC 09/14/2023 Negative  trace - 3+ large Final    PH TPEC 09/14/2023 5.5  5 - 8.5 Final    PROTEIN,TPEC 09/14/2023 Negative  0 - 2000 Final  UROBILINOGEN,TPEC 09/14/2023 0.2  0.2 - 8 Final    NITRITES,TPEC 09/14/2023 Negative  negative - positive Final    LEUKOCYTES,TPEC 09/14/2023 Negative  trace - 3+ large Final    Urine Dipstick - Lot Number 09/14/2023 [id number]   Final    INTERNAL CONTROL-Back Office 09/14/2023 Passed   Final    GLUCOSE 09/14/2023 130 (A)  74 - 99 mg/dL Final   Hospital Outpatient Visit on 09/13/2023   Component Date Value Ref Range Status    WBC 09/13/2023 6.5  3.6 - 10.5 THOU/mcL Final    RBC 09/13/2023 4.59  3.80 - 5.20 MIL/mcL Final    HEMOGLOBIN 09/13/2023 13.3  12.0 - 15.2 g/dL Final    HEMATOCRIT 16/09/9603 40.2  36 - 46 % Final    MCV 09/13/2023 87.6  82 - 97 fL Final    MCH 09/13/2023 29.1  27 - 33 pg Final    MCHC 09/13/2023 33.2  32 - 36 g/dL Final    RDW 54/08/8118 15.4  12.3 - 17.0 % Final    PLATELET 09/13/2023 185  140 - 375 THOU/mcL Final    MPV 09/13/2023 8.8  7.4 - 11.5 fL Final    SODIUM 09/13/2023 142  135 - 145 mmol/L Final    POTASSIUM 09/13/2023 4.2  3.6 - 5.1 mmol/L Final    CHLORIDE 09/13/2023 103  98 - 111 mmol/L Final    CO2 09/13/2023 32  (H)  21 - 31 mmol/L Final    GLUCOSE, RANDOM 09/13/2023 134 (H)  70 - 99 mg/dL Final    BLD UREA NITROGEN 09/13/2023 20  8 - 26 mg/dL Final    CREATININE 14/78/2956 1.54 (H)  0.60 - 1.20 mg/dL Final    CALCIUM 21/30/8657 9.9  8.5 - 10.4 mg/dL Final    TOTAL PROTEIN 09/13/2023 7.5  6.0 - 8.0 g/dL Final    ALBUMIN 84/69/6295 4.1  3.5 - 5.7 g/dL Final    TOTAL BILIRUBIN 09/13/2023 0.6  0.0 - 1.2 mg/dL Final    ALK PHOSPHATASE 09/13/2023 77  35 - 135 IU/L Final    AST 09/13/2023 30  10 - 40 IU/L Final    ALT 09/13/2023 30  10 - 60 IU/L Final    ESTIMATED GFR 09/13/2023 37 (L)  >59 mL/min/1.73 m2 Final    ANION GAP 09/13/2023 7  4 - 16 mmol/L Final    CHOLESTEROL 09/13/2023 133  <200 mg/dL Final    TRIGLYCERIDE 28/41/3244 107  <150 mg/dL Final    HDL CHOLESTEROL 09/13/2023 52  >50 mg/dL Final    LDL CHOLESTEROL (CALCULATED) 09/13/2023 60  <130 mg/dL Final    TOTAL NON HDL CHOL 09/13/2023 81  <130 mg/dL Final    HEMOGLOBIN W1U 09/13/2023 6.8 (H)  4.2 - 5.6 % Final    EST AVERAGE GLUCOSE 09/13/2023 148  mg/dL Final   [facility]ospital [facility] on 09/07/2023   Component Date Value Ref Range Status    SODIUM 09/07/2023 140  136 - 145 mmol/L Final    POTASSIUM 09/07/2023 4.4  3.5 - 5.1 mmol/L Final    CHLORIDE 09/07/2023 100  99 - 110 mmol/L Final    CO2 09/07/2023 27  21 - 32 mmol/L Final    ANION GAP 09/07/2023 13  3 - 16 Final    GLUCOSE 09/07/2023 98  70 - 99 mg/dL Final    BLD UREA NITROGEN 09/07/2023 21 (H)  7 - 20 mg/dL Final    CREATININE 27/25/3664 1.6 (H)  0.6 - 1.2  mg/dL Final    GFR CALC 91/47/8295 36 (A)  >60 Final    CALCIUM 09/07/2023 10.4  8.3 - 10.6 mg/dL Final     Return in about 6 months (around 03/14/2024) for (Comp.), Med Check, with labs 1 week prior.     I have personally spent at least 85 minutes preparing to see the patient, reviewing past medical history including relevant documentation, laboratory studies and imaging studies from other providers, speaking with the patient, performing an appropriate  evaluation of the patient, ordering tests, managing medications, and documenting clinical information in the chart.    Maximino Sarin, DO  [facility]  Family Medicine  Sports Medicine  09/14/2023    Please note that some or all of this record was generated using voice recognition software. If there are any questions about the content of this document, please contact the author as some errors in transcription may have occurred.      Electronically signed by Maximino Sarin, DO at 09/14/2023  9:01 PM EDT

## 2023-09-20 ENCOUNTER — Encounter

## 2023-09-20 MED ORDER — TORSEMIDE 20 MG PO TABS
20 MG | ORAL_TABLET | ORAL | 1 refills | Status: AC
Start: 2023-09-20 — End: 2023-11-08

## 2023-09-24 MED ORDER — TORSEMIDE 20 MG PO TABS
20 MG | ORAL_TABLET | ORAL | 0 refills | Status: AC
Start: 2023-09-24 — End: 2023-11-12

## 2023-09-24 NOTE — Telephone Encounter (Signed)
Last Office Visit  10/19/2023  Next Office Visit  10/05/2023

## 2023-09-27 ENCOUNTER — Encounter

## 2023-09-30 NOTE — Progress Notes (Signed)
Formatting of this note is different from the original.  Date: 09/30/2023    Name: Rachael Mays  Diagnosis:   1. Radiculopathy of lumbar region      Referring Provider: Maximino Sarin, DO  Visit #: 14/20    S: Sitting on the floor actually felt good recently.  Feeling better than last time today.     Functional deficit(s):  Walking further with [employer] distances rather than in-home distances.     Pain: 2/10 in low back, 3-4/10 in the right lateral lower leg.    Compliant with HEP     O:  Objective Eval    Physical Therapy Treatment on this date as follows:      Medbridge Access Code  [email or web address] printed    Exercises then reclined       Therapeutic Exercise Details      use steps instead     Standing - Hip extension  x15ea   Standing - Hip abduction x15ea   CC: rows, seated 30# x15   CC: LPD 30# x15     Standing mini squat @ barre  omitted due to knee pain on left today.     ASU X15, 4"   Deadlift 2x10 with 3# DB's in the hands with low Hi-Lo table as a target.       Neuromuscular Re Education Details     Edu on general exercise (e.g. pt's Margie Billet low impact work out) as appropriate to add to PT regime in addition to [employer] exercise Rx.    Inquiry about what type of exercises this includes.  Attempted to draw elements of this program into PT. Prev     Edu on asymptomatic incidence of disc degen and height loss on scan. Prev     New HEP:  Pain edu on negative thoughts.    Taming the inner critic by noting repeated stories about not being good enough.    Once recognized these stories can be labeled as untrue even as they continue to come to mind. Prev     Other Interventions Details     Leg pull          POE CP to left knee  MHP to L/S     15'           A: Timed-codes: TE 30 min.    Untimed-codes: NA    Today's treatment consisted of all interventions and activities documented with the note above    Patient progressing toward established goals     P: Plan:   Rachael Mays will continue with current plan of  care and established home exercise program as documented within the note above  until next visit which will focus on advancing therapeutic techniques and functional exercises as able to progress toward previous level of function.      Treatment Minutes as follows:    Timed-code Treatment: 30 (min.) Total Treatment Time:  30 (min.)         Electronically signed by Reeves Forth, PT at 09/30/2023  6:12 PM EDT

## 2023-10-04 ENCOUNTER — Encounter

## 2023-10-05 ENCOUNTER — Ambulatory Visit: Admit: 2023-10-05 | Discharge: 2023-10-05 | Payer: MEDICARE | Attending: Internal Medicine | Primary: Sports Medicine

## 2023-10-05 DIAGNOSIS — I10 Essential (primary) hypertension: Secondary | ICD-10-CM

## 2023-10-05 LAB — URINALYSIS WITH MICROSCOPIC
Bacteria, UA: NONE SEEN /[HPF]
Bilirubin, Urine: NEGATIVE
Blood, Urine: NEGATIVE
Epithelial Cells, UA: 3 /[HPF] (ref 0–5)
Glucose, Ur: >=1000 mg/dL — AB
Hyaline Casts, UA: 1 /[LPF] (ref 0–8)
Ketones, Urine: NEGATIVE mg/dL
Leukocyte Esterase, Urine: NEGATIVE
Nitrite, Urine: NEGATIVE
Protein, UA: NEGATIVE mg/dL
RBC, UA: 1 /[HPF] (ref 0–4)
Specific Gravity, UA: 1.012 (ref 1.005–1.030)
Urobilinogen, Urine: 0.2 U/dL (ref <2.0)
WBC, UA: 1 /[HPF] (ref 0–5)
pH, Urine: 6 (ref 5.0–8.0)

## 2023-10-05 LAB — ANA: ANA: NEGATIVE

## 2023-10-05 LAB — MICROALBUMIN / CREATININE URINE RATIO
Creatinine, Ur: 28.4 mg/dL (ref 28.0–259.0)
Microalb, Ur: <1.2 mg/dL (ref <2.0)

## 2023-10-05 LAB — BASIC METABOLIC PANEL
Anion Gap: 14 (ref 3–16)
BUN: 21 mg/dL — ABNORMAL HIGH (ref 7–20)
CO2: 26 mmol/L (ref 21–32)
Calcium: 10.3 mg/dL (ref 8.3–10.6)
Chloride: 99 mmol/L (ref 99–110)
Creatinine: 1.4 mg/dL — ABNORMAL HIGH (ref 0.6–1.2)
Est, Glom Filt Rate: 42 — AB (ref >60)
Glucose: 127 mg/dL — ABNORMAL HIGH (ref 70–99)
Potassium: 4.5 mmol/L (ref 3.5–5.1)
Sodium: 139 mmol/L (ref 136–145)

## 2023-10-05 LAB — KAPPA/LAMBDA QUANTITATIVE FREE LIGHT CHAINS, SERUM
K/L Ratio: 1.25 (ref 0.22–1.74)
KAPPA, FREE LIGHT CHAINS, SERUM: 59.6 mg/L — ABNORMAL HIGH (ref 2.37–20.73)
LAMBDA, FREE LIGHT CHAINS, SERUM: 47.6 mg/L — ABNORMAL HIGH (ref 4.23–27.69)

## 2023-10-05 LAB — PROTEIN / CREATININE RATIO, URINE
Creatinine, Ur: 29.1 mg/dL (ref 28.0–259.0)
Protein, Ur: 6 mg/dL (ref <12)
Protein/Creat Ratio: 0.2 mg/dL

## 2023-10-05 LAB — C4 COMPLEMENT: C4 Complement: 22 mg/dL (ref 10.0–40.0)

## 2023-10-05 LAB — C3 COMPLEMENT: C3 Complement: 175 mg/dL (ref 90.0–180.0)

## 2023-10-05 NOTE — Progress Notes (Signed)
Office : [phone]     Fax [phone]      Nephrology progress Note        History of Present Illness:    This is a 67 y.o. female who presents to the office for evaluation of  worsening renal function .  H/o DM 2 - none   H/o HTN - now BP better after loss of weight   H/o Renal stones - none   H/o renal disease in family   H/o CAD - h/o Stent in past. H/o CHF   CHF?     H/o smoking .    Today visit 10/05/23  Swelling improved once Torsemide increased to 20 mg bid   Cr 1.4<-- 1.55 10/24<-- 1.6 10/24<- 1.4 8/22  <--1.2 7/24: base line   Lytes good  GFR 50 --> 40 --> 36 --> 42  Echo 9/24: G1DD   Ca wnl 10.4   CBC wnl  UACR UPCR wnl 9/24   ANA FLC C3 C4 all wnl   UACR neg   UM blnad   BP -   Swelling  DOE+   Pt is diagnsoed with HFpEF and mild pulm HTN   Pt denies CP/palpitations/abdominal pain//V.   Pt denies fever/ chills  Pt denies dysuria or hematuria   Low sodium   N0 NSAIDS     Heart cath neg   BP at home 130/70  Meds include Torsemide, Dilt, Imdur , Jardiance   BP at home not controlled recently     Pt is on TOrsemide 20 mg tid   Also SPironolaconte Imdur Dilt       Hemoglobin A1C   Date Value Ref Range Status   03/31/2023 5.5 See comment % Final     Comment:     Comment:  Diagnosis of Diabetes: > or = 6.5%  Increased risk of diabetes (Prediabetes): 5.7-6.4%  Glycemic Control: Nonpregnant Adults: <7.0%                    Pregnant: <6.0%           Recently stopped lisinopril .   Pt is on spironolactone  , Dilt, Imdur, Torsemide     Previous labs:   Cr 1.3- 1.5 9/23  <-- 1.6 7/23<--1.4 4/23<--1. 3/23<--1.3<--2.0 (2/23) <--1.8 1/23<--1.4 8/22<--1.1 3/22<-- normal 2021 <--base line 1.0-1.1     GFR 44  Lytes ok   UPCR 0.07  UACR - neg 7/23  UA- no RBC WBC   HCV neg   CT ABD 5/23: KIDNEYS/URETERS:  Right renal cyst.  No hydronephrosis, stone, or suspicious mass . ADRENAL LESION  Mri 9/23: 1.3 cm right adrenal nodule demonstrates signal characteristics consistent with an  adenoma.   S/p CTPA 2/22  Echo 7/23:    systolic function with an estimated ejection fraction of 55-60%.   No regional wall motion abnormalities are seen.   Normal diastolic function.E/e"=11.35.    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/2017    Depression     GERD (gastroesophageal reflux disease)  NO LONGER SINCE LAP    GERD (gastroesophageal reflux disease) 01/23/2009    Hx of blood clots     Hyperlipidemia     hx; resolved with lap band    Hypertension     Idiopathic peripheral neuropathy 03/26/2017    Melanoma (HCC)     upper back    MRSA (methicillin resistant staph aureus) culture positive 10/17/2019    leg abscess    Obesity     hx of; had lap band    Obstructive sleep apnea 09/18/2015    uses bi-pap    Obstructive sleep apnea (adult) (pediatric) 09/18/2015    Pulmonary embolism (HCC)     Stenosis of right carotid artery 06/16/2021    Type 2 diabetes mellitus with diabetic polyneuropathy, without long-term current use of insulin (HCC) 01/26/2022       Past Surgical History:        Procedure Laterality Date    BACK SURGERY      neck  plates    BREAST SURGERY      left lumpectomy    CERVICAL FUSION      CESAREAN SECTION      CHOLECYSTECTOMY      COLONOSCOPY      COLONOSCOPY  04/08/2010    COLONOSCOPY N/A 04/18/2021    COLONOSCOPY DIAGNOSTIC performed by Kandice Robinsons, MD at [facility] ENDOSCOPY    CORONARY ANGIOPLASTY  03/31/2016    mid LAD 3.25x18 alpine    DIAGNOSTIC CARDIAC CATH LAB PROCEDURE  08/09/2019    normal    DILATATION, ESOPHAGUS      ENDOSCOPY, COLON, DIAGNOSTIC      HYSTERECTOMY (CERVIX STATUS UNKNOWN)      HYSTERECTOMY, VAGINAL      LAP BAND  05/01/2008    Dr. Suzi Roots    OTHER SURGICAL HISTORY       [place] Filter: curently in place    OTHER SURGICAL HISTORY  07/05/2013    lap band removal    SHOULDER SURGERY      SKIN CANCER EXCISION  12/29/2017    UPPER GASTROINTESTINAL ENDOSCOPY  05/19/2013    UPPER GASTROINTESTINAL ENDOSCOPY  07/21/2013    ESOPHAGEAL STENT PLACEMENT    UPPER GASTROINTESTINAL ENDOSCOPY N/A 07/31/2014    Esophagogastroduodenoscopy with esophageal balloon dilation    UPPER GASTROINTESTINAL ENDOSCOPY N/A 04/18/2021    EGD DILATION BALLOON performed by Kandice Robinsons, MD at [facility] ENDOSCOPY    VASCULAR SURGERY  04/2015    Donzetta Sprung, celiac artery angiogram, normal abd arteries       Current Medications:    Current Outpatient Medications   Medication Sig Dispense Refill    Tirzepatide (MOUNJARO SC) Inject 5 mg into the skin every 7 days      torsemide (DEMADEX) 20 MG tablet TAKE 1 TABLET BY MOUTH IN THE MORNING, AT NOON, AND AT BEDTIME 90 tablet 0    torsemide (DEMADEX) 20 MG tablet Take one tablets in the morning  one tablet in the evening 90 tablet 1    dilTIAZem (CARDIZEM CD) 240 MG extended release capsule Take 1 capsule by mouth daily 90 capsule 1    empagliflozin (JARDIANCE) 10 MG tablet Take 1 tablet by mouth daily 90 tablet 1    Blood Pressure KIT 1 Device by Does not apply route 2 times daily at 0800 and 1400 1 kit 0    Blood Pressure Monitoring (BLOOD PRESSURE CUFF) MISC 1 Device by Does not apply route daily 1 each 0  Blood Pressure Monitoring (BLOOD PRESSURE CUFF) MISC 1 Device by Does not apply route in the morning and at bedtime 1 each 0    spironolactone (ALDACTONE) 25 MG tablet Take 1 tablet by mouth daily 30 tablet 3    ranolazine (RANEXA) 500 MG extended release tablet Take 1 tablet by mouth 2 times daily 180 tablet 3    isosorbide mononitrate (IMDUR) 30 MG extended release tablet Take 1 tablet by mouth daily 90 tablet 3    triamcinolone (KENALOG) 0.1 % cream Apply to the affected areas of the ears twice daily for up to 2 weeks or until improved. 15 g 2    gabapentin  (NEURONTIN) 300 MG capsule Take 1 capsule by mouth nightly.      Magnesium 400 MG CAPS Take by mouth daily      fluocinonide (LIDEX) 0.05 % external solution APPLY SPARINGLY TO THE BACK OF THE SCALP DAILY AS NEEDED FOR ITCHING 60 mL 1    nitroGLYCERIN (NITROSTAT) 0.4 MG SL tablet up to max of 3 total doses. If no relief after 1 dose, call 911. 25 tablet 3    clobetasol (TEMOVATE) 0.05 % cream Apply to affected area twice daily for up to 2 weeks or until improved. 60 g 1    omeprazole (PRILOSEC) 40 MG delayed release capsule TAKE ONE CAPSULE BY MOUTH DAILY 30 capsule 0    triamcinolone (KENALOG) 0.1 % cream Apply to affected area twice daily for up to 2 weeks or until improved. 15 g 2    montelukast (SINGULAIR) 10 MG tablet TAKE ONE TABLET BY MOUTH DAILY. **MUST CALL MD FOR APPOINTMENT 30 tablet 5    oxyCODONE-acetaminophen (PERCOCET) 7.5-325 MG per tablet as needed.      rosuvastatin (CRESTOR) 20 MG tablet daily      Coenzyme Q10 (CO Q10) 200 MG CAPS Take by mouth daily (with breakfast)      calcium citrate-vitamin D (CITRICAL + D) 315-250 MG-UNIT TABS per tablet Take 2 tablets by mouth 2 times daily      Cholecalciferol (VITAMIN D3) 50 MCG (2000 UT) CAPS Take 1 capsule by mouth daily 90 capsule 3    albuterol sulfate HFA (PROVENTIL HFA) 108 (90 Base) MCG/ACT inhaler Inhale 2 puffs into the lungs every 6 hours as needed for Wheezing 3 Inhaler 3    lidocaine (LIDODERM) 5 % Place 1 patch onto the skin daily 12 hours on, 12 hours off. (Patient taking differently: Place 1 patch onto the skin as needed 12 hours on, 12 hours off.) 30 patch 0    aspirin 81 MG EC tablet Take 1 tablet by mouth daily 30 tablet 11    Multiple Vitamins-Minerals (MULTIVITAMIN PO) Take 1 tablet by mouth daily        No current facility-administered medications for this visit.       Allergies:  Ibuprofen, Lisinopril, Seasonal, and Talwin [pentazocine]    Social History:   Social History     Socioeconomic History    Marital status: Divorced      Spouse name: [name]    Number of children: 2    Years of education: 12    Highest education level: Not on file   Occupational History    Occupation: Retired   Tobacco Use    Smoking status: Former     Current packs/day: 0.00     Average packs/day: 0.5 packs/day for 40.0 years (20.0 ttl pk-yrs)     Types: Cigarettes     Start date:  08/07/1972     Quit date: 08/07/2012     Years since quitting: 11.1    Smokeless tobacco: Never   Vaping Use    Vaping status: Never Used   Substance and Sexual Activity    Alcohol use: No    Drug use: No    Sexual activity: Not Currently     Partners: Male   Other Topics Concern    Not on file   Social History Narrative    Works as Conservation officer, nature at [employer]: Low Risk  (05/15/2020)    Overall Financial Resource Strain (CARDIA)     Difficulty of Paying Living Expenses: Not hard at all   Food Insecurity: No Food Insecurity (03/31/2023)    Hunger Vital Sign     Worried About Running Out of Food in the Last Year: Never true     Ran Out of Food in the Last Year: Never true   Transportation Needs: No Transportation Needs (03/31/2023)    PRAPARE - Therapist, art (Medical): No     Lack of Transportation (Non-Medical): No   Physical Activity: Not on file   Stress: Not on file   Social Connections: Not on file   Intimate Partner Violence: Unknown (12/25/2022)    Received from [employer] and [employer], Air traffic [employer] and [employer]    Interpersonal Safety     Feel physically or emotionally unsafe where currently live: Not on file     Harm by anyone: Not on file     Emotionally Harmed: Not on file   Housing Stability: Low Risk  (03/31/2023)    Housing Stability Vital Sign     Unable to Pay for Housing in the Last Year: No     Number of Places Lived in the Last Year: 1     Unstable Housing in the Last Year: No       Family History:   Family History   Problem Relation Age of Onset    Arthritis Mother      Cancer Mother     Depression Mother     High Blood Pressure Mother     Heart Disease Mother 35        MI     Ovarian Cancer Mother 85    Cancer Father 52        colon    Other Sister         OSA    Cancer Sister         lung liver    Other Brother         OSA    Sleep Apnea Brother     Other Brother         OSA    Sleep Apnea Brother     Heart Disease Maternal Grandmother     Early Death Paternal Grandmother     Heart Disease Paternal Grandfather     Other Daughter         OSA    Diabetes Other     High Blood Pressure Other     Obesity Other        Review of Systems:    Constitutional: No fever, no chills, no lethargy, no weakness.  HEENT:  No headache,  sore throat.  Cardiac:  No chest pain, ++, orthopnea or PND.  Chest:  No cough, phlegm or wheezing.  Abdomen:  No abdominal pain, nausea or vomiting.  Neuro:                No focal weakness, abnormal movements orseizure like activity.  Skin:   No rashes, no itching.  GU:   No hematuria,, no dysuria, no flank pain.  Extremities:  +2 swelling       Objective:  Wt (!) 142.7 kg (314 lb 9.6 oz)   BMI 52.35 kg/m     Physical Exam:  General appearance:Awake, alert, in no acute distress  Skin: warm and dry, no rash or erythema    Eyes: conjunctivae normal and sclera anicteric    ENT: :no swelling     Neck: without any JVD. No carotid bruits or thyromegaly.      Pulmonary: b/l rales     Cardiovascular:Normal S1 & S2, No S3 or  S4, No  Pericardial rub , No Murmur     Abdomen: soft nontender, bowel sounds present, no organomegaly,  No ascites      Extremities: no cyanosis, +1-2  b/l pitting edema    Labs:    CBC:   Lab Results   Component Value Date/Time    WBC 8.6 08/17/2023 03:04 PM    RBC 4.47 08/17/2023 03:04 PM    HGB 13.0 08/17/2023 03:04 PM    HCT 38.9 08/17/2023 03:04 PM    MCV 87.0 08/17/2023 03:04 PM    RDW 15.3 08/17/2023 03:04 PM    PLT 154 08/17/2023 03:04 PM    MPV 9.6 08/17/2023 03:04 PM      BMP:   Lab Results   Component Value Date/Time     NA 139 10/04/2023 01:15 PM    NA 140 09/07/2023 02:15 PM    NA 142 08/17/2023 03:04 PM    K 4.5 10/04/2023 01:15 PM    K 4.4 09/07/2023 02:15 PM    K 4.4 08/17/2023 03:04 PM    K 4.6 10/17/2019 06:07 AM    K 3.9 05/05/2017 03:21 AM    CL 99 10/04/2023 01:15 PM    CL 100 09/07/2023 02:15 PM    CL 102 08/17/2023 03:04 PM    CO2 26 10/04/2023 01:15 PM    CO2 27 09/07/2023 02:15 PM    CO2 28 08/17/2023 03:04 PM    BUN 21 10/04/2023 01:15 PM    BUN 21 09/07/2023 02:15 PM    BUN 16 08/17/2023 03:04 PM    CREATININE 1.4 10/04/2023 01:15 PM    CREATININE 1.6 09/07/2023 02:15 PM    CREATININE 1.4 08/17/2023 03:04 PM    GLUCOSE 127 10/04/2023 01:15 PM    GLUCOSE 98 09/07/2023 02:15 PM    GLUCOSE 191 08/17/2023 03:04 PM    CALCIUM 10.3 10/04/2023 01:15 PM    CALCIUM 10.4 09/07/2023 02:15 PM    CALCIUM 9.2 08/17/2023 03:04 PM      BNP:No results found for: "BNP"  PHOSPHORUS:    Lab Results   Component Value Date/Time    PHOS 3.8 08/21/2013 03:46 PM    PHOS 3.3 08/18/2013 08:13 AM    PHOS 3.5 08/17/2013 06:49 AM     MAGNESIUM:   Lab Results   Component Value Date/Time    MG 1.90 04/05/2023 04:41 AM     ALBUMIN:   No results found for: "LABALBU"    IRON:    Lab Results   Component Value Date/Time    IRON 93 03/26/2010 05:35 PM  IRON SATURATION:    No components found for: "LABIRON"    TIBC:    Lab Results   Component Value Date/Time    TIBC 397 03/26/2010 05:35 PM     FERRITIN:    Lab Results   Component Value Date/Time    FERRITIN 61.9 05/02/2009 05:40 PM     ANA:   Lab Results   Component Value Date    ANA Negative 10/04/2023       SPEP:   Lab Results   Component Value Date/Time    GAMGLOB 1.2 03/29/2017 03:33 PM     UPEP: No results found for: "TPU"   HEPBSAG:No results found for: "HEPBSAG"  HEPCAB:No results found for: "HEPCAB"  C3:   Lab Results   Component Value Date    C3 175.0 10/04/2023     C4:   Lab Results   Component Value Date    C4 22.0 10/04/2023     MPO ANCA: No results found for: "MPO" .  PR3 ANCA:  No results  found for: "PR3"  PTH: No results found for: "PTH"    Urine Creatinine:    No results found for: "LABCREA"    Urine Eosinophils: No results found for: "UREO"  Urine Protein:  No results found for: "TPU"  Urinalysis:  U/A:   Lab Results   Component Value Date/Time    NITRU Negative 10/04/2023 01:16 PM    COLORU Yellow 10/04/2023 01:16 PM    PHUR 6.0 10/04/2023 01:16 PM    PHUR 7.0 03/31/2023 04:01 PM    LABCAST 0-1 Hyaline 07/09/2013 02:09 AM    WBCUA 1 10/04/2023 01:16 PM    RBCUA 1 10/04/2023 01:16 PM    RBCUA NEGATIVE 02/13/2021 03:03 PM    MUCUS PRESENT 02/13/2021 03:03 PM    YEAST 0 11/25/2017 04:24 PM    BACTERIA None Seen 10/04/2023 01:16 PM    CLARITYU Clear 10/04/2023 01:16 PM    LEUKOCYTESUR Negative 10/04/2023 01:16 PM    UROBILINOGEN 0.2 10/04/2023 01:16 PM    BILIRUBINUR Negative 10/04/2023 01:16 PM    BLOODU Negative 10/04/2023 01:16 PM    GLUCOSEU >=1000 10/04/2023 01:16 PM    KETUA Negative 10/04/2023 01:16 PM         Radiology:  Reviewed as available.    Assessment/Plan:    CKD stage 3B sus CRS  Creat ~ 1.4   --> 1.6 --> 1.4   Neg basic work up for GN   No proteinuria of hemturia: risk of GN low   No proteinuria   Torsemide 20 mg bid   Edema better  No NSAIDS  On low sodium diet   Off  Metfrmin   Off PPU   FLC SPEP- normal   Likley CRS   Plan:  No need for RAASi  No changes in meds   Labs before next visit        H/o CHF but EF improved, CAD  Still edematous   Keep  torsemide crrent dose   Monitor BP   Low sodium diet  No NSAIDS     H/o adrenal nodule . CT scan in 2020  MRI- benign nodsule   Down the rpad- recheck renin/ Aldosterone     HTN - fluctuate but better controlled 130s/70s   BP reasonable   Spironolactone renewed   Keep TOrsemide   Discussed low salt diet  No NSAIDS  Counseling done for diet and lifestyle changes.  Explained importance of compliance with medications  Shirlee More, MD  Nephrology     [facility] Office : [address] , [place] [address]  [facility] Office: [address], [place], [place] [address]  [facility] Office: [address], [place] [address].  [facility] Office: [address] [place] [address]  Office : [phone]  Fax :[phone]     Discussed w/ MD- Metformin discontinued

## 2023-10-05 NOTE — Patient Instructions (Signed)
Please avoid NSAIDs medications  - e.g. Advil, motrin, ibuprofen, aleve, diclofenac, mobic, celebrex, nabumetone.etc: they can harm your kidneys!     Low sodium diet

## 2023-10-06 LAB — ELECTROPHORESIS PROTEIN, SERUM
Albumin: 3.2 g/dL (ref 3.1–4.9)
Alpha-1-Globulin: 0.3 g/dL (ref 0.2–0.4)
Alpha-2-Globulin: 1 g/dL (ref 0.4–1.1)
Beta Globulin: 1.4 g/dL (ref 0.9–1.6)
Gamma Globulin: 1.5 g/dL (ref 0.6–1.8)
Total Protein: 7.4 g/dL (ref 6.4–8.2)

## 2023-10-07 NOTE — Progress Notes (Signed)
Formatting of this note is different from the original.  Date: 10/07/2023    Name: Rachael Mays  Diagnosis:   1. Radiculopathy of lumbar region      Referring Provider: Maximino Sarin, DO  Visit #: 15/20    S: Pt reports that she's feeling better.  Pt is finding a right knee brace "absolutely amazing."     Functional deficit(s): Walking further with [employer] distances rather than in-home distances.      Pain: 1/10 in low back, 3/10 in the right lateral lower leg.     Decreased pain.     O:  Objective Eval    Physical Therapy Treatment on this date as follows:      [employer] Access Code  [email or web address] printed    Exercises then reclined       Therapeutic Exercise Details      use steps instead     Standing - Hip extension  x15ea   Standing - Hip abduction x15ea   CC: rows, seated 30# x15   CC: LPD 30# x15     Standing mini squat @ barre  omitted due to knee pain on left today.     ASU X15, 4"   Deadlift 2x10 with 3# DB's in the hands with low Hi-Lo table as a target.       Neuromuscular Re Education Details     Edu on general exercise (e.g. pt's Margie Billet low impact work out) as appropriate to add to PT regime in addition to [employer] exercise Rx.    Inquiry about what type of exercises this includes.  Attempted to draw elements of this program into PT. Prev     Edu on asymptomatic incidence of disc degen and height loss on scan. Prev     New HEP:  Pain edu on negative thoughts.    Taming the inner critic by noting repeated stories about not being good enough.    Once recognized these stories can be labeled as untrue even as they continue to come to mind. Prev     Other Interventions Details     Leg pull          POE             A: Timed-codes: TE 25 min.    Untimed-codes: NA    Today's treatment consisted of all interventions and activities documented with the note above    Patient progressing toward established goals     P: Plan:   Rachael Mays will continue with current plan of care and established home exercise  program as documented within the note above  until next visit which will focus on advancing therapeutic techniques and functional exercises as able to progress toward previous level of function.      Treatment Minutes as follows:    Timed-code Treatment: 25 (min.) Total Treatment Time:  25 (min.)         Electronically signed by Reeves Forth, PT at 10/07/2023  5:17 PM EDT

## 2023-10-14 ENCOUNTER — Encounter: Admit: 2023-10-14 | Discharge: 2023-10-14 | Payer: MEDICARE | Attending: Dermatology | Primary: Sports Medicine

## 2023-10-14 DIAGNOSIS — L219 Seborrheic dermatitis, unspecified: Secondary | ICD-10-CM

## 2023-10-14 MED ORDER — CLOBETASOL PROPIONATE 0.05 % EX SOLN
0.05 | CUTANEOUS | 2 refills | Status: DC
Start: 2023-10-14 — End: 2024-06-05

## 2023-10-14 NOTE — Progress Notes (Signed)
[facility]  Conley Canal, MD  [phone]      Rachael Mays  08/25/1956    67 y.o. female     Date of Visit: 10/14/2023    Chief Complaint: skin lesions    History of Present Illness:    1.  She returns for a history of seborrheic dermatitis.  Ear involvement controlled with use of triamcinolone 0.1% cream.  Scalp involvement remains problematic - ran out of Lidex solution.     2.  She presents today for evaluation of multiple moles - not aware of any changes in size, color or shape.       3.  She reports growths on the cheek and chest.     4.  She reports several growths on the neck.     5.  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 Sherrie Mustache on 12/29/2017.       Review of Systems:  Gen: Feels well, good sense of health.  Skin: No new or changing moles.    Past Medical History, Family History, Surgical History, Medications and Allergies reviewed.    Past Medical History:   Diagnosis Date    AKI (acute kidney injury) (HCC) 01/26/2022    Anxiety     Asthma     Blood circulation, collateral     CAD (coronary artery disease)     CHF (congestive heart failure) (HCC)     Chronic back pain     Deep vein thrombosis     Degeneration of thoracic intervertebral disc 10/15/2017    Depression     GERD (gastroesophageal reflux disease)     NO LONGER SINCE LAP    GERD (gastroesophageal reflux disease) 01/23/2009    Hx of blood clots     Hyperlipidemia     hx; resolved with lap band    Hypertension     Idiopathic peripheral neuropathy 03/26/2017    Melanoma (HCC)     upper back    MRSA (methicillin resistant staph aureus) culture positive 10/17/2019    leg abscess    Obesity     hx of; had lap band    Obstructive sleep apnea 09/18/2015    uses bi-pap    Obstructive sleep apnea (adult) (pediatric) 09/18/2015    Pulmonary embolism (HCC)     Stenosis of right carotid artery 06/16/2021    Type 2  diabetes mellitus with diabetic polyneuropathy, without long-term current use of insulin (HCC) 01/26/2022     Past Surgical History:   Procedure Laterality Date    BACK SURGERY      neck  plates    BREAST SURGERY      left lumpectomy    CERVICAL FUSION      CESAREAN SECTION      CHOLECYSTECTOMY      COLONOSCOPY      COLONOSCOPY  04/08/2010    COLONOSCOPY N/A 04/18/2021    COLONOSCOPY DIAGNOSTIC performed by Kandice Robinsons, MD at [facility]    CORONARY ANGIOPLASTY  03/31/2016    mid LAD 3.25x18 alpine    DIAGNOSTIC CARDIAC CATH LAB PROCEDURE  08/09/2019    normal    DILATATION, ESOPHAGUS      ENDOSCOPY, COLON, DIAGNOSTIC      HYSTERECTOMY (CERVIX STATUS UNKNOWN)      HYSTERECTOMY, VAGINAL      LAP BAND  05/01/2008  Dr. Suzi Roots    OTHER SURGICAL HISTORY      [place] Filter: curently in place    OTHER SURGICAL HISTORY  07/05/2013    lap band removal    SHOULDER SURGERY      SKIN CANCER EXCISION  12/29/2017    UPPER GASTROINTESTINAL ENDOSCOPY  05/19/2013    UPPER GASTROINTESTINAL ENDOSCOPY  07/21/2013    ESOPHAGEAL STENT PLACEMENT    UPPER GASTROINTESTINAL ENDOSCOPY N/A 07/31/2014    Esophagogastroduodenoscopy with esophageal balloon dilation    UPPER GASTROINTESTINAL ENDOSCOPY N/A 04/18/2021    EGD DILATION BALLOON performed by Kandice Robinsons, MD at [facility] END[facility]    VASCULAR SURGERY  04/2015    Donzetta Sprung, celiac artery angiogram, normal abd arteries       Allergies   Allergen Reactions    Ibuprofen      Due to lap band erosion    Lisinopril      Memory loss     Seasonal     Talwin [Pentazocine] Other (See Comments)     dizzy     Outpatient Medications Marked as Taking for the 10/14/23 encounter (Office Visit) with Ronelle Nigh, MD   Medication Sig Dispense Refill    clobetasol (TEMOVATE) 0.05 % external solution Apply a small amount daily to the affected areas of the scalp as needed for itching. 50 mL 2    Tirzepatide (MOUNJARO SC) Inject 5 mg into the skin every 7 days      torsemide (DEMADEX) 20  MG tablet TAKE 1 TABLET BY MOUTH IN THE MORNING, AT NOON, AND AT BEDTIME 90 tablet 0    torsemide (DEMADEX) 20 MG tablet Take one tablets in the morning  one tablet in the evening 90 tablet 1    dilTIAZem (CARDIZEM CD) 240 MG extended release capsule Take 1 capsule by mouth daily 90 capsule 1    empagliflozin (JARDIANCE) 10 MG tablet Take 1 tablet by mouth daily 90 tablet 1    Blood Pressure KIT 1 Device by Does not apply route 2 times daily at 0800 and 1400 1 kit 0    Blood Pressure Monitoring (BLOOD PRESSURE CUFF) MISC 1 Device by Does not apply route daily 1 each 0    Blood Pressure Monitoring (BLOOD PRESSURE CUFF) MISC 1 Device by Does not apply route in the morning and at bedtime 1 each 0    spironolactone (ALDACTONE) 25 MG tablet Take 1 tablet by mouth daily 30 tablet 3    ranolazine (RANEXA) 500 MG extended release tablet Take 1 tablet by mouth 2 times daily 180 tablet 3    isosorbide mononitrate (IMDUR) 30 MG extended release tablet Take 1 tablet by mouth daily 90 tablet 3    triamcinolone (KENALOG) 0.1 % cream Apply to the affected areas of the ears twice daily for up to 2 weeks or until improved. 15 g 2    gabapentin (NEURONTIN) 300 MG capsule Take 1 capsule by mouth nightly.      Magnesium 400 MG CAPS Take by mouth daily      nitroGLYCERIN (NITROSTAT) 0.4 MG SL tablet up to max of 3 total doses. If no relief after 1 dose, call 911. 25 tablet 3    clobetasol (TEMOVATE) 0.05 % cream Apply to affected area twice daily for up to 2 weeks or until improved. 60 g 1    omeprazole (PRILOSEC) 40 MG delayed release capsule TAKE ONE CAPSULE BY MOUTH DAILY 30 capsule 0    triamcinolone (KENALOG) 0.1 %  cream Apply to affected area twice daily for up to 2 weeks or until improved. 15 g 2    montelukast (SINGULAIR) 10 MG tablet TAKE ONE TABLET BY MOUTH DAILY. **MUST CALL MD FOR APPOINTMENT 30 tablet 5    oxyCODONE-acetaminophen (PERCOCET) 7.5-325 MG per tablet as needed.      rosuvastatin (CRESTOR) 20 MG tablet daily       Coenzyme Q10 (CO Q10) 200 MG CAPS Take by mouth daily (with breakfast)      calcium citrate-vitamin D (CITRICAL + D) 315-250 MG-UNIT TABS per tablet Take 2 tablets by mouth 2 times daily      Cholecalciferol (VITAMIN D3) 50 MCG (2000 UT) CAPS Take 1 capsule by mouth daily 90 capsule 3    albuterol sulfate HFA (PROVENTIL HFA) 108 (90 Base) MCG/ACT inhaler Inhale 2 puffs into the lungs every 6 hours as needed for Wheezing 3 Inhaler 3    lidocaine (LIDODERM) 5 % Place 1 patch onto the skin daily 12 hours on, 12 hours off. (Patient taking differently: Place 1 patch onto the skin as needed 12 hours on, 12 hours off.) 30 patch 0    aspirin 81 MG EC tablet Take 1 tablet by mouth daily 30 tablet 11    Multiple Vitamins-Minerals (MULTIVITAMIN PO) Take 1 tablet by mouth daily          Physical Examination     Well appearing.    1.  Occipital scalp with mild scaling.  Concha of the ears with mild erythema.     2.  Trunk and extremities with multiple well defined round to oval smooth brown macules and papules.     3.  Cheeks, upper trunk with stuck-on appearing tan-brown verrucous papules and plaques.     4.  Neck with multiple small pedunculated skin colored papules.     5.  Upper back with a linear surgical scar.         Assessment and Plan     1. Seborrheic dermatitis of the scalp and ears - overall controlled    Switch to clobetasol solution daily as needed for scalp involvement.     Triamcinolone 0.1% cream twice daily as needed for ear involvement.      2. Multiple nevi - benign appearing    Sun protective behaviors, including use of at least SPF 30 sunscreen, and self skin examinations were encouraged.  Call for any new or concerning lesions.       3. SK (seborrheic keratosis)     Reassurance.      4. Cutaneous skin tags     Reassurance.      5. History of stage IA melanoma on the upper back - no signs of recurrence.     Sun protective behaviors, including use of at least SPF 30 sunscreen, and self skin examinations were  encouraged.  Call for any new or concerning lesions.           Return in about 6 months (around 04/12/2024).    --Ronelle Nigh, MD

## 2023-10-20 NOTE — Telephone Encounter (Signed)
 Submitted PA for Clobetasol Solution  Via CMM Key: [id number]  STATUS: PENDING.    Follow up done daily; if no decision with in three days we will refax.  If another three days goes by with no decision will call the insurance for status.

## 2023-10-20 NOTE — Telephone Encounter (Signed)
 The medication was DENIED; DENIAL letter is uploaded to MED[employer].    Generic Denial:  Other; please see Denial Letter.     If you want an APPEAL; please note in this encounter what new information you would like to APPEAL with.  Once complete route back to PA

## 2023-10-21 NOTE — Telephone Encounter (Addendum)
 Faxed Goodrx coupon for $24 to [employer].  Left message advising patient of this.

## 2023-10-22 NOTE — Telephone Encounter (Signed)
 Called and spoke to patient and she stated that she was able to pick up clobetasol solution for $1.55 with her insurance.

## 2023-10-22 NOTE — Telephone Encounter (Signed)
 Patient returned call to [facility].  She is wanting to try the lower dose of Clobetesol solution.  [phone]

## 2023-11-08 ENCOUNTER — Encounter: Admit: 2023-11-08 | Admitting: Internal Medicine

## 2023-11-08 DIAGNOSIS — I10 Essential (primary) hypertension: Secondary | ICD-10-CM

## 2023-11-08 MED ORDER — TORSEMIDE 20 MG PO TABS
20 | ORAL_TABLET | ORAL | 1 refills | Status: DC
Start: 2023-11-08 — End: 2024-07-31

## 2023-11-12 ENCOUNTER — Encounter: Admit: 2023-11-12 | Admitting: Internal Medicine

## 2023-11-12 DIAGNOSIS — N1832 Chronic kidney disease, stage 3b (HCC): Secondary | ICD-10-CM

## 2023-11-12 MED ORDER — TORSEMIDE 20 MG PO TABS
20 | ORAL_TABLET | ORAL | 0 refills | 30.00000 days | Status: DC
Start: 2023-11-12 — End: 2024-06-05

## 2023-11-12 NOTE — Telephone Encounter (Signed)
 Last Office Visit  10/05/2023  Next Office Visit  02/01/2024

## 2023-11-12 NOTE — Telephone Encounter (Signed)
 Last Office Visit 10/19/2023  Next Office Visit 02/01/2024

## 2023-12-06 ENCOUNTER — Encounter: Admit: 2023-12-06 | Admitting: Internal Medicine

## 2023-12-06 DIAGNOSIS — I10 Essential (primary) hypertension: Secondary | ICD-10-CM

## 2023-12-06 MED ORDER — SPIRONOLACTONE 25 MG PO TABS
25 | ORAL_TABLET | Freq: Every day | ORAL | 3 refills | Status: AC
Start: 2023-12-06 — End: ?

## 2024-02-01 ENCOUNTER — Ambulatory Visit: Admit: 2024-02-01 | Discharge: 2024-02-01 | Payer: MEDICARE | Attending: Internal Medicine | Primary: Sports Medicine

## 2024-02-01 VITALS — BP 144/74 | HR 67

## 2024-02-01 DIAGNOSIS — I10 Essential (primary) hypertension: Secondary | ICD-10-CM

## 2024-02-01 NOTE — Progress Notes (Signed)
 Office : [phone]     Fax :[phone]      Nephrology progress Note        History of Present Illness:    This is a 68 y.o. female who presents to the office for evaluation of  worsening renal function .  H/o DM 2 - none   H/o HTN - now BP better after loss of weight   H/o Renal stones - none   H/o renal disease in family   H/o CAD - h/o Stent in past. H/o CHF   CHF?     H/o smoking .    Today visit 02/01/24  Swelligm is better  Down to 1 Torsemide daily   No updated labs   .  Swelling improved once Torsemide increased to 20 mg bid   Cr 1.4<-- 1.55 10/24<-- 1.6 10/24<- 1.4 8/22  <--1.2 7/24: base line   Lytes good  GFR 50 --> 40 --> 36 --> 42  Echo 9/24: G1DD   Ca wnl 10.4   CBC wnl  UACR UPCR wnl 9/24   ANA FLC C3 C4 all wnl   UACR neg   UM blnad   BP -     Appetite reduced and she feels bloated   She has not seen her surgeon   Pt is diagnsoed with HFpEF and mild pulm HTN   Pt denies CP/palpitations/abdominal pain//V.   Pt denies fever/ chills  Pt denies dysuria or hematuria   Low sodium   N0 NSAIDS     Heart cath neg   BP at home 130/70  Meds include Torsemide, Dilt, Imdur , Jardiance   BP at home not controlled recently     Pt is on TOrsemide 20 mg tid   Also SPironolaconte Imdur Dilt   BP at home usually ok       Hemoglobin A1C   Date Value Ref Range Status   03/31/2023 5.5 See comment % Final     Comment:     Comment:  Diagnosis of Diabetes: > or = 6.5%  Increased risk of diabetes (Prediabetes): 5.7-6.4%  Glycemic Control: Nonpregnant Adults: <7.0%                    Pregnant: <6.0%           Recently stopped lisinopril .   Pt is on spironolactone  , Dilt, Imdur, Torsemide     Previous labs:   Cr 1.3- 1.5 9/23  <-- 1.6 7/23<--1.4 4/23<--1. 3/23<--1.3<--2.0 (2/23) <--1.8 1/23<--1.4 8/22<--1.1 3/22<-- normal 2021 <--base line 1.0-1.1     GFR 44  Lytes ok   UPCR 0.07  UACR - neg 7/23  UA- no RBC WBC   HCV neg   CT ABD 5/23: KIDNEYS/URETERS:  Right renal cyst.  No  hydronephrosis, stone, or suspicious mass . ADRENAL LESION  Mri 9/23: 1.3 cm right adrenal nodule demonstrates signal characteristics consistent with an adenoma.   S/p CTPA 2/22  Echo 7/23:    systolic function with an estimated ejection fraction of 55-60%.   No regional wall motion abnormalities are seen.   Normal diastolic function.E/e"=11.35.    No usage of NSAIDS     Past Medical History:        Diagnosis Date    AKI (acute kidney injury) 01/26/2022    Anxiety     Asthma     Blood circulation, collateral     CAD (coronary artery disease)     CHF (congestive heart failure) (HCC)  Chronic back pain     Deep vein thrombosis     Degeneration of thoracic intervertebral disc 10/15/2017    Depression     GERD (gastroesophageal reflux disease)     NO LONGER SINCE LAP    GERD (gastroesophageal reflux disease) 01/23/2009    Hx of blood clots     Hyperlipidemia     hx; resolved with lap band    Hypertension     Idiopathic peripheral neuropathy 03/26/2017    Melanoma (HCC)     upper back    MRSA (methicillin resistant staph aureus) culture positive 10/17/2019    leg abscess    Obesity     hx of; had lap band    Obstructive sleep apnea 09/18/2015    uses bi-pap    Obstructive sleep apnea (adult) (pediatric) 09/18/2015    Pulmonary embolism (HCC)     Stenosis of right carotid artery 06/16/2021    Type 2 diabetes mellitus with diabetic polyneuropathy, without long-term current use of insulin (HCC) 01/26/2022       Past Surgical History:        Procedure Laterality Date    BACK SURGERY      neck  plates    BREAST SURGERY      left lumpectomy    CERVICAL FUSION      CESAREAN SECTION      CHOLECYSTECTOMY      COLONOSCOPY      COLONOSCOPY  04/08/2010    COLONOSCOPY N/A 04/18/2021    COLONOSCOPY DIAGNOSTIC performed by Kandice Robinsons, MD at [facility] ENDOSCOPY    CORONARY ANGIOPLASTY  03/31/2016    mid LAD 3.25x18 alpine    DIAGNOSTIC CARDIAC CATH LAB PROCEDURE  08/09/2019    normal    DILATATION, ESOPHAGUS      ENDOSCOPY, COLON,  DIAGNOSTIC      HYSTERECTOMY (CERVIX STATUS UNKNOWN)      HYSTERECTOMY, VAGINAL      LAP BAND  05/01/2008    Dr. Suzi Roots    OTHER SURGICAL HISTORY      [place] Filter: curently in place    OTHER SURGICAL HISTORY  07/05/2013    lap band removal    SHOULDER SURGERY      SKIN CANCER EXCISION  12/29/2017    UPPER GASTROINTESTINAL ENDOSCOPY  05/19/2013    UPPER GASTROINTESTINAL ENDOSCOPY  07/21/2013    ESOPHAGEAL STENT PLACEMENT    UPPER GASTROINTESTINAL ENDOSCOPY N/A 07/31/2014    Esophagogastroduodenoscopy with esophageal balloon dilation    UPPER GASTROINTESTINAL ENDOSCOPY N/A 04/18/2021    EGD DILATION BALLOON performed by Kandice Robinsons, MD at [facility] ENDOSCOPY    VASCULAR SURGERY  04/2015    Donzetta Sprung, celiac artery angiogram, normal abd arteries       Current Medications:    Current Outpatient Medications   Medication Sig Dispense Refill    spironolactone (ALDACTONE) 25 MG tablet Take 1 tablet by mouth daily 30 tablet 3    torsemide (DEMADEX) 20 MG tablet TAKE 2 TABLETS BY MOUTH EVERY MORNING, 1 TABLET AT NOON, AND 1 TABLET EVERY EVENING 90 tablet 0    torsemide (DEMADEX) 20 MG tablet TAKE 1 TABLET BY MOUTH IN THE MORNING, AT NOON, AND AT BEDTIME 90 tablet 0    torsemide (DEMADEX) 20 MG tablet Take one tablets in the morning  one tablet in the evening 90 tablet 1    clobetasol (TEMOVATE) 0.05 % external solution Apply a small amount daily to the affected areas of the  scalp as needed for itching. 50 mL 2    Tirzepatide (MOUNJARO SC) Inject 5 mg into the skin every 7 days      dilTIAZem (CARDIZEM CD) 240 MG extended release capsule Take 1 capsule by mouth daily 90 capsule 1    empagliflozin (JARDIANCE) 10 MG tablet Take 1 tablet by mouth daily 90 tablet 1    Blood Pressure KIT 1 Device by Does not apply route 2 times daily at 0800 and 1400 1 kit 0    Blood Pressure Monitoring (BLOOD PRESSURE CUFF) MISC 1 Device by Does not apply route daily 1 each 0    Blood Pressure Monitoring (BLOOD PRESSURE CUFF) MISC 1 Device  by Does not apply route in the morning and at bedtime 1 each 0    ranolazine (RANEXA) 500 MG extended release tablet Take 1 tablet by mouth 2 times daily 180 tablet 3    isosorbide mononitrate (IMDUR) 30 MG extended release tablet Take 1 tablet by mouth daily 90 tablet 3    triamcinolone (KENALOG) 0.1 % cream Apply to the affected areas of the ears twice daily for up to 2 weeks or until improved. 15 g 2    gabapentin (NEURONTIN) 300 MG capsule Take 1 capsule by mouth nightly.      Magnesium 400 MG CAPS Take by mouth daily      nitroGLYCERIN (NITROSTAT) 0.4 MG SL tablet up to max of 3 total doses. If no relief after 1 dose, call 911. 25 tablet 3    clobetasol (TEMOVATE) 0.05 % cream Apply to affected area twice daily for up to 2 weeks or until improved. 60 g 1    omeprazole (PRILOSEC) 40 MG delayed release capsule TAKE ONE CAPSULE BY MOUTH DAILY 30 capsule 0    triamcinolone (KENALOG) 0.1 % cream Apply to affected area twice daily for up to 2 weeks or until improved. 15 g 2    montelukast (SINGULAIR) 10 MG tablet TAKE ONE TABLET BY MOUTH DAILY. **MUST CALL MD FOR APPOINTMENT 30 tablet 5    oxyCODONE-acetaminophen (PERCOCET) 7.5-325 MG per tablet as needed.      rosuvastatin (CRESTOR) 20 MG tablet daily      Coenzyme Q10 (CO Q10) 200 MG CAPS Take by mouth daily (with breakfast)      calcium citrate-vitamin D (CITRICAL + D) 315-250 MG-UNIT TABS per tablet Take 2 tablets by mouth 2 times daily      Cholecalciferol (VITAMIN D3) 50 MCG (2000 UT) CAPS Take 1 capsule by mouth daily 90 capsule 3    albuterol sulfate HFA (PROVENTIL HFA) 108 (90 Base) MCG/ACT inhaler Inhale 2 puffs into the lungs every 6 hours as needed for Wheezing 3 Inhaler 3    lidocaine (LIDODERM) 5 % Place 1 patch onto the skin daily 12 hours on, 12 hours off. (Patient taking differently: Place 1 patch onto the skin as needed 12 hours on, 12 hours off.) 30 patch 0    aspirin 81 MG EC tablet Take 1 tablet by mouth daily 30 tablet 11    Multiple  Vitamins-Minerals (MULTIVITAMIN PO) Take 1 tablet by mouth daily        No current facility-administered medications for this visit.       Allergies:  Ibuprofen, Lisinopril, Seasonal, and Talwin [pentazocine]    Social History:   Social History     Socioeconomic History    Marital status: Divorced     Spouse name: [name]    Number of children: 2  Years of education: 8    Highest education level: Not on file   Occupational History    Occupation: Retired   Tobacco Use    Smoking status: Former     Current packs/day: 0.00     Average packs/day: 0.5 packs/day for 40.0 years (20.0 ttl pk-yrs)     Types: Cigarettes     Start date: 08/07/1972     Quit date: 08/07/2012     Years since quitting: 11.4    Smokeless tobacco: Never   Vaping Use    Vaping status: Never Used   Substance and Sexual Activity    Alcohol use: No    Drug use: No    Sexual activity: Not Currently     Partners: Male   Other Topics Concern    Not on file   Social History Narrative    Works as Conservation officer, nature at [employer]: Low Risk  (05/15/2020)    Overall Financial Resource Strain (CARDIA)     Difficulty of Paying Living Expenses: Not hard at all   Food Insecurity: No Food Insecurity (03/31/2023)    Hunger Vital Sign     Worried About Running Out of Food in the Last Year: Never true     Ran Out of Food in the Last Year: Never true   Transportation Needs: No Transportation Needs (03/31/2023)    PRAPARE - Therapist, art (Medical): No     Lack of Transportation (Non-Medical): No   Physical Activity: Not on file   Stress: Not on file   Social Connections: Not on file   Intimate Partner Violence: Unknown (12/25/2022)    Received from [employer] and [employer], [employer] traffic [employer] and [employer]     Feel physically or emotionally unsafe where currently live: Not on file     Harm by anyone: Not on file     Emotionally Harmed: Not on file    Housing Stability: Low Risk  (03/31/2023)    Housing Stability Vital Sign     Unable to Pay for Housing in the Last Year: No     Number of Places Lived in the Last Year: 1     Unstable Housing in the Last Year: No       Family History:   Family History   Problem Relation Age of Onset    Arthritis Mother     Cancer Mother     Depression Mother     High Blood Pressure Mother     Heart Disease Mother 44        MI     Ovarian Cancer Mother 24    Cancer Father 67        colon    Other Sister         OSA    Cancer Sister         lung liver    Other Brother         OSA    Sleep Apnea Brother     Other Brother         OSA    Sleep Apnea Brother     Heart Disease Maternal Grandmother     Early Death Paternal Grandmother     Heart Disease Paternal Grandfather     Other Daughter         OSA  Diabetes Other     High Blood Pressure Other     Obesity Other        Review of Systems:    Constitutional: No fever, no chills, no lethargy, no weakness.  HEENT:  No headache,  sore throat.  Cardiac:  No chest pain, ++, orthopnea or PND.  Chest:              No cough, phlegm or wheezing.  Abdomen:  No abdominal pain, nausea or vomiting.  Neuro:                No focal weakness, abnormal movements orseizure like activity.  Skin:   No rashes, no itching.  GU:   No hematuria,, no dysuria, no flank pain.  Extremities:  +2 swelling       Objective:  BP (!) 144/74   Pulse 67     Physical Exam:  General appearance:Awake, alert, in no acute distress  Skin: warm and dry, no rash or erythema    Eyes: conjunctivae normal and sclera anicteric    ENT: :no swelling     Neck: without any JVD. No carotid bruits or thyromegaly.      Pulmonary: b/l rales     Cardiovascular:Normal S1 & S2, No S3 or  S4, No  Pericardial rub , No Murmur     Abdomen: soft nontender, bowel sounds present, no organomegaly,  No ascites      Extremities: no cyanosis, +1  b/l pitting edema    Labs:    CBC:   Lab Results   Component Value Date/Time    WBC 8.6 08/17/2023 03:04 PM     RBC 4.47 08/17/2023 03:04 PM    HGB 13.0 08/17/2023 03:04 PM    HCT 38.9 08/17/2023 03:04 PM    MCV 87.0 08/17/2023 03:04 PM    RDW 15.3 08/17/2023 03:04 PM    PLT 154 08/17/2023 03:04 PM    MPV 9.6 08/17/2023 03:04 PM      BMP:   Lab Results   Component Value Date/Time    NA 139 10/04/2023 01:15 PM    NA 140 09/07/2023 02:15 PM    NA 142 08/17/2023 03:04 PM    K 4.5 10/04/2023 01:15 PM    K 4.4 09/07/2023 02:15 PM    K 4.4 08/17/2023 03:04 PM    K 4.6 10/17/2019 06:07 AM    K 3.9 05/05/2017 03:21 AM    CL 99 10/04/2023 01:15 PM    CL 100 09/07/2023 02:15 PM    CL 102 08/17/2023 03:04 PM    CO2 26 10/04/2023 01:15 PM    CO2 27 09/07/2023 02:15 PM    CO2 28 08/17/2023 03:04 PM    BUN 21 10/04/2023 01:15 PM    BUN 21 09/07/2023 02:15 PM    BUN 16 08/17/2023 03:04 PM    CREATININE 1.4 10/04/2023 01:15 PM    CREATININE 1.6 09/07/2023 02:15 PM    CREATININE 1.4 08/17/2023 03:04 PM    GLUCOSE 127 10/04/2023 01:15 PM    GLUCOSE 98 09/07/2023 02:15 PM    GLUCOSE 191 08/17/2023 03:04 PM    CALCIUM 10.3 10/04/2023 01:15 PM    CALCIUM 10.4 09/07/2023 02:15 PM    CALCIUM 9.2 08/17/2023 03:04 PM      BNP:No results found for: "BNP"  PHOSPHORUS:    Lab Results   Component Value Date/Time    PHOS 3.8 08/21/2013 03:46 PM    PHOS 3.3 08/18/2013 08:13 AM  PHOS 3.5 08/17/2013 06:49 AM     MAGNESIUM:   Lab Results   Component Value Date/Time    MG 1.90 04/05/2023 04:41 AM     ALBUMIN:   No results found for: "LABALBU"    IRON:    Lab Results   Component Value Date/Time    IRON 93 03/26/2010 05:35 PM     IRON SATURATION:    No components found for: "LABIRON"    TIBC:    Lab Results   Component Value Date/Time    TIBC 397 03/26/2010 05:35 PM     FERRITIN:    Lab Results   Component Value Date/Time    FERRITIN 61.9 05/02/2009 05:40 PM     ANA:   Lab Results   Component Value Date    ANA Negative 10/04/2023       SPEP:   Lab Results   Component Value Date/Time    GAMGLOB 1.5 10/04/2023 01:15 PM     UPEP: No results found for: "TPU"    HEPBSAG:No results found for: "HEPBSAG"  HEPCAB:No results found for: "HEPCAB"  C3:   Lab Results   Component Value Date    C3 175.0 10/04/2023     C4:   Lab Results   Component Value Date    C4 22.0 10/04/2023     MPO ANCA: No results found for: "MPO" .  PR3 ANCA:  No results found for: "PR3"  PTH: No results found for: "PTH"    Urine Creatinine:    No results found for: "LABCREA"    Urine Eosinophils: No results found for: "UREO"  Urine Protein:  No results found for: "TPU"  Urinalysis:  U/A:   Lab Results   Component Value Date/Time    NITRU Negative 10/04/2023 01:16 PM    COLORU Yellow 10/04/2023 01:16 PM    PHUR 6.0 10/04/2023 01:16 PM    PHUR 7.0 03/31/2023 04:01 PM    LABCAST 0-1 Hyaline 07/09/2013 02:09 AM    WBCUA 1 10/04/2023 01:16 PM    RBCUA 1 10/04/2023 01:16 PM    RBCUA NEGATIVE 02/13/2021 03:03 PM    MUCUS PRESENT 02/13/2021 03:03 PM    YEAST 0 11/25/2017 04:24 PM    BACTERIA None Seen 10/04/2023 01:16 PM    CLARITYU Clear 10/04/2023 01:16 PM    LEUKOCYTESUR Negative 10/04/2023 01:16 PM    UROBILINOGEN 0.2 10/04/2023 01:16 PM    BILIRUBINUR Negative 10/04/2023 01:16 PM    BLOODU Negative 10/04/2023 01:16 PM    GLUCOSEU >=1000 10/04/2023 01:16 PM    KETUA Negative 10/04/2023 01:16 PM         Radiology:  Reviewed as available.    Assessment/Plan:    CKD stage 3B sus CRS  No updaed labs   Creat ~ 1.4   --> 1.6 --> 1.4   Neg basic work up for GN   No proteinuria of hemturia: risk of GN low   No proteinuria   Torsemide 20 mg   Edema better but still, edematous   No NSAIDS  On low sodium diet   Off  Metfrmin   Off PPU   FLC SPEP- normal   Likley CRS   Plan:  No need for RAASi  No changes in meds   Labs before next visit        H/o CHF but EF improved, CAD  Still edematous   Keep  torsemide crrent dose   Monitor BP   Low sodium diet  No NSAIDS  Increase Torsemide      H/o adrenal nodule . CT scan in 2020  MRI- benign nodsule   Down the rpad- recheck renin/ Aldosterone     HTN - fluctuate : mildly elevated    BP reasonable   Spironolactone renewed   Keep TOrsemide   Discussed low salt diet  No NSAIDS  Counseling done for diet and lifestyle changes.  Explained importance of compliance with medications     Shirlee More, MD  Nephrology     [facility] Office : [address], [address] , [place] [address]  [facility] Office: [address], [place], [place] [address]  [facility] Office: [address] [place] [address].  [facility] Office: [address] [place] [address]  Office : [phone]  Fax :[phone]     Discussed w/ MD- Metformin discontinued

## 2024-02-01 NOTE — Patient Instructions (Addendum)
 Check labs   If kidney function is stable: take one day Torsemide 20 mg and the other 40 mg (2 tablets)   Please check your blood pressure and weight and send me results in 3-4 weeks   Limit water intake to 50 ounces   Low sodium diet   Please avoid NSAIDs medications  - e.g. Advil, motrin, ibuprofen, aleve, diclofenac, mobic, celebrex, nabumetone.etc: they can harm your kidneys!     Patient Education        Low Sodium Diet (2,000 Milligram): Care Instructions  Overview     Limiting sodium can be an important part of managing some health problems.  The most common source of sodium is salt. People get most of the salt in their diet from canned, prepared, and packaged foods. Fast food and restaurant meals also are very high in sodium. Your doctor will probably limit your sodium to less than 2,000 milligrams (mg) a day. This limit counts all the sodium in prepared and packaged foods and any salt you add to your food.  Follow-up care is a key part of your treatment and safety. Be sure to make and go to all appointments, and call your doctor if you are having problems. It's also a good idea to know your test results and keep a list of the medicines you take.  How can you care for yourself at home?  Read food labels  Read labels on cans and food packages. The labels tell you how much sodium is in each serving. Make sure that you look at the serving size. If you eat more than the serving size, you have eaten more sodium.  Food labels also tell you the Percent Daily Value for sodium. Choose products with low Percent Daily Values for sodium.  Be aware that sodium can come in forms other than salt, including monosodium glutamate (MSG), sodium citrate, and sodium bicarbonate (baking soda). MSG is often added to Asian food. When you eat out, you can sometimes ask for food without MSG or added salt.  Buy low-sodium foods  Buy foods that are labeled "unsalted" (no salt added), "sodium-free" (less than 5 mg of sodium per serving),  or "low-sodium" (140 mg or less of sodium per serving). Foods labeled "reduced-sodium" and "light sodium" may still have too much sodium. Be sure to read the label to see how much sodium you are getting.  Buy fresh vegetables, or frozen vegetables without added sauces. Buy low-sodium versions of canned vegetables, soups, and other canned goods.  Prepare low-sodium meals  Cut back on the amount of salt you use in cooking. This will help you adjust to the taste. Do not add salt after cooking. One teaspoon of salt has about 2,300 mg of sodium.  Take the salt shaker off the table.  Flavor your food with garlic, lemon juice, onion, vinegar, herbs, and spices. Do not use soy sauce, lite soy sauce, steak sauce, onion salt, garlic salt, celery salt, or ketchup on your food.  Use low-sodium salad dressings, sauces, and ketchup. Or make your own salad dressings and sauces without adding salt.  Use less salt (or none) when recipes call for it. You can often use half the salt a recipe calls for without losing flavor. Other foods such as rice, pasta, and grains do not need added salt.  Rinse canned vegetables, and cook them in fresh water. This removes some--but not all--of the salt.  Avoid water that is naturally high in sodium or that has been treated  with water softeners, which add sodium. If you buy bottled water, read the label and choose a sodium-free brand.  Avoid high-sodium foods  Avoid eating:  Smoked, cured, salted, and canned meat, fish, and poultry.  Ham, bacon, hot dogs, and luncheon meats.  Regular, hard, and processed cheese and regular peanut butter.  Crackers with salted tops, and other salted snack foods such as pretzels, chips, and salted popcorn.  Frozen prepared meals, unless labeled low-sodium.  Canned and dried soups, broths, and bouillon, unless labeled sodium-free or low-sodium.  Canned vegetables, unless labeled sodium-free or low-sodium.  [place] fries, pizza, tacos, and other fast foods.  Pickles,  olives, ketchup, and other condiments, especially soy sauce, unless labeled sodium-free or low-sodium.  Where can you learn more?  Go to [employer] and enter V843 to learn more about "Low Sodium Diet (2,000 Milligram): Care Instructions."  Current as of: August 26, 2022  Content Version: 14.3   [address] [place].   Care instructions adapted under license by [facility]. If you have questions about a medical condition or this instruction, always ask your healthcare professional. Larene Beach, [facility], disclaims any warranty or liability for your use of this information.         Patient Education        How to Read a Food Label to Limit Sodium: Care Instructions  Overview  Limiting sodium can be an important part of managing some health problems.  Processed foods, fast food, and restaurant foods are the major sources of dietary sodium. The most common name for sodium is salt. Most packaged foods have a Nutrition Facts label. This will tell you how much sodium is in one serving of food.  Follow-up care is a key part of your treatment and safety. Be sure to make and go to all appointments, and call your doctor if you are having problems. It's also a good idea to know your test results and keep a list of the medicines you take.  How can you care for yourself at home?  Read ingredient lists on food labels  Read the list of ingredients on food labels to help you find how much sodium is in a food. The label lists the ingredients in a food in descending order (from the most to the least). If salt or sodium is high on the list, there may be a lot of sodium in the food.  Know that sodium has different names. Sodium is also called monosodium glutamate (MSG), sodium citrate, sodium alginate, and sodium phosphate.  Read Nutrition Facts labels  On most foods, there is a Nutrition Facts label. This will tell you how much sodium is in one serving of food. Look at both the serving size and  the sodium amount. The serving size is located at the top of the label, usually right under the "Nutrition Facts" title. The amount of sodium is given in the list under the title. It is given in milligrams (mg).  Check the serving size carefully. A single serving is often very small, and you may eat more than one serving. If this is the case, you will eat more sodium than listed on the label. For example, if the serving size for a canned soup is 1 cup and the sodium amount is 470 mg, if you have 2 cups you will eat 940 mg of sodium.  The nutrition facts for fresh fruits and vegetables are not listed on the food. They may be listed somewhere in the store.  These foods usually have no sodium or low sodium.  The Nutrition Facts label also gives you the Percent Daily Value for sodium. This is how much of the recommended amount of sodium a serving contains. The daily value for sodium is 2,300 mg. So if the Percent Daily Value says 50%, this means one serving is giving you half of this, or 1,150 mg.  Buy low-sodium foods  Look for foods that are made with less sodium. Watch for the following words on the label.  "Unsalted" means there is no sodium added to the food. But there may be sodium already in the food naturally.  "Sodium-free" means a serving has less than 5 mg of sodium.  "Very low sodium" means a serving has 35 mg or less of sodium.  "Low-sodium" means a serving has 140 mg or less of sodium.  "Reduced-sodium" means that there is 25% less sodium than what the food normally has. This is still usually too much sodium.  Buy fresh vegetables, or frozen vegetables without added sauces. Buy low-sodium versions of canned vegetables, soups, and other canned goods.  Where can you learn more?  Go to [employer] and enter B757 to learn more about "How to Read a Food Label to Limit Sodium: Care Instructions."  Current as of: September 29, 2022  Content Version: 14.3   [address] .    Care instructions adapted under license by [facility]. If you have questions about a medical condition or this instruction, always ask your healthcare professional. Larene Beach, [facility], disclaims any warranty or liability for your use of this information.

## 2024-02-02 ENCOUNTER — Encounter

## 2024-02-03 LAB — CBC
Hematocrit: 41.8 % (ref 36.0–48.0)
Hemoglobin: 13.9 g/dL (ref 12.0–16.0)
MCH: 28.8 pg (ref 26.0–34.0)
MCHC: 33.1 g/dL (ref 31.0–36.0)
MCV: 87.1 fL (ref 80.0–100.0)
MPV: 8.8 fL (ref 5.0–10.5)
Platelets: 208 10*3/uL (ref 135–450)
RBC: 4.8 M/uL (ref 4.00–5.20)
RDW: 15.4 % (ref 12.4–15.4)
WBC: 6.8 10*3/uL (ref 4.0–11.0)

## 2024-02-03 LAB — COMPREHENSIVE METABOLIC PANEL
ALT: 22 U/L (ref 10–40)
AST: 26 U/L (ref 15–37)
Albumin/Globulin Ratio: 1.6 (ref 1.1–2.2)
Albumin: 4.1 g/dL (ref 3.4–5.0)
Alkaline Phosphatase: 87 U/L (ref 40–129)
Anion Gap: 9 (ref 3–16)
BUN: 15 mg/dL (ref 7–20)
CO2: 28 mmol/L (ref 21–32)
Calcium: 9.9 mg/dL (ref 8.3–10.6)
Chloride: 104 mmol/L (ref 99–110)
Creatinine: 1.4 mg/dL — ABNORMAL HIGH (ref 0.6–1.2)
Est, Glom Filt Rate: 42 — AB (ref >60)
Glucose: 103 mg/dL — ABNORMAL HIGH (ref 70–99)
Potassium: 4.7 mmol/L (ref 3.5–5.1)
Sodium: 141 mmol/L (ref 136–145)
Total Bilirubin: 0.4 mg/dL (ref 0.0–1.0)
Total Protein: 6.6 g/dL (ref 6.4–8.2)

## 2024-02-03 LAB — VITAMIN D 25 HYDROXY: Vit D, 25-Hydroxy: 52 ng/mL (ref >=30)

## 2024-02-03 LAB — ALBUMIN/CREATININE RATIO, URINE
Albumin Urine: <1.2 mg/dL (ref <2.0)
Creatinine, Ur: 27.3 mg/dL — ABNORMAL LOW (ref 28.0–259.0)

## 2024-02-03 LAB — PTH, INTACT: Pth Intact: 52.2 pg/mL (ref 14.0–72.0)

## 2024-02-16 ENCOUNTER — Encounter

## 2024-02-16 MED ORDER — SPIRONOLACTONE 25 MG PO TABS
25 | ORAL_TABLET | Freq: Every day | ORAL | 3 refills | 90.00000 days | Status: DC
Start: 2024-02-16 — End: 2024-04-10

## 2024-02-16 NOTE — Telephone Encounter (Signed)
 Last Office Visit 02/01/2024  Next Office Visit 05/30/2024

## 2024-02-19 ENCOUNTER — Encounter

## 2024-02-21 MED ORDER — EMPAGLIFLOZIN 10 MG PO TABS
10 | ORAL_TABLET | Freq: Every day | ORAL | 1 refills | 30.00000 days | Status: DC
Start: 2024-02-21 — End: 2024-08-21

## 2024-02-25 MED ORDER — DILTIAZEM HCL ER COATED BEADS 240 MG PO CP24
240 | ORAL_CAPSULE | Freq: Every day | ORAL | 1 refills | 88.50000 days | Status: DC
Start: 2024-02-25 — End: 2024-08-21

## 2024-02-25 NOTE — Telephone Encounter (Signed)
 Received refill request for Diltiazem from [facility] pharmacy.     Last OV: 06/04/2023 [facility]     Next OV: 06/05/2024 NPTS    Last Labs: 04/05/2023 EKG

## 2024-02-25 NOTE — Telephone Encounter (Signed)
 Medication Refill    Medication needing refilled:    dilTIAZem (CARDIZEM CD)    Dosage of the medication:  240 MG extended release capsule   How are you taking this medication (QD, BID, TID, QID, PRN):    30 or 90 day supply called in:  90  When will you run out of your medication:    Which Pharmacy are we sending the medication to?:  [facility] [id number] [place], [place] - [address] - P [phone] [place]on Ginsberg [phone]  [address], [place] [address]  Phone: [phone]  Fax: [phone]

## 2024-03-20 MED ORDER — BETAMETHASONE DIPROPIONATE AUG 0.05 % EX LOTN
0.05 | CUTANEOUS | 2 refills | Status: AC
Start: 2024-03-20 — End: ?

## 2024-04-10 ENCOUNTER — Encounter

## 2024-04-10 MED ORDER — SPIRONOLACTONE 25 MG PO TABS
25 | ORAL_TABLET | Freq: Every day | ORAL | 3 refills | 60.00000 days | Status: DC
Start: 2024-04-10 — End: 2024-07-31

## 2024-04-10 NOTE — Telephone Encounter (Signed)
 As appt 05/30/2024

## 2024-04-19 ENCOUNTER — Encounter: Payer: MEDICARE | Attending: Dermatology | Primary: Sports Medicine

## 2024-04-20 NOTE — Progress Notes (Signed)
 Formatting of this note is different from the original.  Images from the original note were not included.    SPORTS MEDICINE NOTE    Patient Name: Rachael Mays MRN: [id number]   Age: 68 year old Date of Birth: 08/30/1956   Sex: female        CHIEF COMPLAINT     Chief Complaint   Patient presents with    Knee Problem     Left      HISTORY OF PRESENT ILLNESS     Rachael Mays is a 68 year old female who presents today with the complaint of left knee pain.     -Duration: worsening of chronic    -8 month   -Injury/Mechanism of worsening: Denies  -Location of pain: Posterior  -Swelling: Reports  -Crepitation: Denies  -Locking: Denies  -Instability: Reports  -Pain with prolonged activity: Reports  -"Get up and go" pain: Reports  -Pain with steps: Reports  -Previous knee surgery: Denies  -Previous injection therapy: Denies  -Treatment at home: Percocet  and ice, moderate benefit    Past Surgical History:   Procedure Laterality Date    BREAST BIOPSY      BREAST SURGERY      biopsy (negative)    CERVICAL SPINE SURGERY      CHOLECYSTECTOMY WITH/WITHOUT COMMON DUCT EXPLORATION      CORONARY ANGIOPLASTY WITH STENT PLACEMENT  03/2019    stent    HYSTERECTOMY      LAPAROSCOPIC GASTRIC BANDING      placed 2010, removed 2015     -Last A1c:   HEMOGLOBIN A1C (%)   Date Value   04/04/2024 5.6     -Renal Function  ESTIMATED GFR   Date Value Ref Range Status   04/04/2024 38 (L) >=60 mL/min/1.73 m2 Final     Comment:     (NOTE)  Estimated GFR was calculated using the CKD-EPIcr (2021) equation  refit without race. The equation is recommended by the [employer] [facility].    09/13/2023 37 (L) >59 mL/min/1.73 m2 Final     Comment:     Estimated GFR was calculated using the CKD-EPI cr (2021) equation refit without race.  The equation is recommended by the [employer] [facility].         ALLERGIES     Allergies   Allergen Reactions     Pentazocine Other (See Comments)     dizzy    Ibuprofen  Unknown     Contraindicated per Nephrologist    Due to lap band erosion    Lisinopril  Confusion, Dizziness and Other (See Comments)    Morphine  Itch    Talwin [Pentazocine Lactate] Other (See Comments)     hallucinations     MEDICATIONS     Prior to Admission medications    Medication Sig Start Date End Date Taking? Authorizing Provider   montelukast  (SINGULAIR ) 10 MG TABS Take 1 tablet by mouth daily. 04/17/24  Yes Travis Friedman, DO   tirzepatide [facility]) 10 mg/0.5 mL subcutaneous auto-injector Inject 10 mg under the skin every 7 (seven) days. 04/11/24  Yes Travis Friedman, DO   hydrOXYzine  HCl (ATARAX ) 25 MG TABS Take 1 tablet by mouth 2 (two) times daily as needed. 04/11/24  Yes Travis Friedman, DO   famotidine  (PEPCID ) 20 mg tablet Take 1 tablet by mouth 2 (two) times daily. 04/04/24  Yes Travis Friedman, DO  betamethasone  dipropionate (DIPROLENE ) 0.05 % CREA Apply  topically 2 (two) times daily. 12/09/23  Yes Sheri Dillon, MD   JARDIANCE  10 MG TABS Take 10 mg by mouth daily. 08/26/23  Yes HISTORICAL MED   spironolactone  (ALDACTONE ) 25 MG TABS Take 25 mg by mouth daily.   Yes HISTORICAL MED   torsemide  (DEMADEX ) 20 MG TABS Take one tablets in the morning  one tablet in the evening 08/26/23  Yes HISTORICAL MED   dilTIAZem  (CARDIZEM  CD) 240 MG CP24 Take 240 mg by mouth daily. 08/26/23  Yes HISTORICAL MED   gabapentin  (NEURONTIN ) 400 MG CAPS Take 800 mg by mouth nightly.   Yes HISTORICAL MED   rosuvastatin  (CRESTOR ) 40 MG TABS Take 1 tablet by mouth nightly. 08/16/23  Yes Travis Friedman, DO   fluocinonide  (LIDEX ) 0.05 % SOLN APPLY SPARINGLY TO THE BACK OF THE SCALP DAILY AS NEEDED FOR ITCHING 12/01/22  Yes HISTORICAL MED   isosorbide  mononitrate (IMDUR ) 30 MG TB24 Take 30 mg by mouth daily. 12/06/22  Yes HISTORICAL MED   fluticasone (FLONASE) 50 mcg/act nasal spray Use 1 spray in each nostril daily. 01/04/23  Yes  Travis Friedman, DO   Magnesium  250 MG TABS Take 350 mg by mouth.   Yes HISTORICAL MED   ranolazine  (RANEXA ) 500 MG TB12 Take 1 tablet by mouth 2 (two) times daily. 02/18/21  Yes Whitis, Arminda Landmark., MD   oxycodone -acetaminophen  (PERCOCET ) 7.5-325 MG TABS Take 1 tablet by mouth every 6 (six) hours as needed (pain).   Yes HISTORICAL MED   aspirin  81 MG TBEC Take 81 mg by mouth daily.   Yes HISTORICAL MED   Coenzyme Q10 (COQ10) 200 MG CAPS Take 200 mg by mouth daily.   Yes HISTORICAL MED   Calcium  Citrate-Vitamin D  315-250 MG-UNIT TABS Take 2 tablets by mouth 2 (two) times daily.   Yes HISTORICAL MED   nitroGLYCERIN  (NITROSTAT ) 0.4 MG SUBL 1 tablet by Sublingual route every 5 (five) minutes as needed. 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 03/31/2016     PAST SURGICAL HISTORY     Past Surgical History:   Procedure Laterality Date    BREAST BIOPSY      BREAST SURGERY      biopsy (negative)    CERVICAL SPINE SURGERY      CHOLECYSTECTOMY WITH/WITHOUT COMMON DUCT EXPLORATION      CORONARY ANGIOPLASTY WITH STENT PLACEMENT  03/2019    stent    HYSTERECTOMY      LAPAROSCOPIC GASTRIC BANDING      placed 2010, removed 2015     SOCIAL HISTORY     Social History     Socioeconomic History    Marital status: Divorced   Tobacco Use    Smoking status: Former     Types: Cigarettes    Smokeless tobacco: Never    Tobacco comments:     quit 2013  Substance and Sexual Activity    Alcohol use: Not Currently     Comment: on rare occasion    Drug use: Never    Sexual activity: Yes     Partners: Male      Birth control/protection: Surgical     Comment: hyst     Social Drivers of Health     Financial Resource Strain: Low Risk  (05/15/2020)    Received [facility]    Overall Financial Resource Strain (CARDIA)     Difficulty of Paying Living Expenses: Not hard at all   Food Insecurity: No Food Insecurity (03/31/2023)    Received [facility]    Hunger Vital Sign     Worried About Running Out of Food in the Last Year: Never true     Ran Out of Food in the Last Year: Never true   Transportation Needs: No Transportation Needs (03/31/2023)    Received from [facility]    PRAPARE - Therapist, art (Medical): No     Lack of Transportation (Non-Medical): No    Interpersonal Safety   Housing Stability: Low Risk  (03/31/2023)    Received from [facility]    Housing Stability Vital Sign     Unable to Pay for Housing in the Last Year: No     Number of Places Lived in the Last Year: 1     Unstable Housing in the Last Year: No     FAMILY HISTORY     Family History   Problem Relation Age of Onset    Heart Attack Mother     Ovarian cancer Mother 24    Hypertension Mother     High Cholesterol Mother     Diabetes Mother     Colon cancer Father 70    Thyroid Disease Sister     Heart Attack Maternal Grandmother     Diabetes Maternal Grandfather     Diabetes Paternal Grandmother     Heart Attack Paternal Grandfather     Breast cancer Neg Hx     Bleeding/clotting disorder Neg Hx     Stroke Neg Hx      REVIEW OF SYSTEMS   Please see HPI. Denies fever, chills, HA, blurred vision, rhinorrhea, cough, CP/SOB, N/V/D/C, urinary symptoms, muscle pain, numbness or tingling, bleeding or clotting disorders.  All other ROS negative.    PHYSICAL EXAM   Vital Signs: BP 128/64   Pulse 69   Temp  98.6 F (37 C) (Skin)   Ht 65" (165.1 cm)   Wt (!) 303 lb (137.4 kg)   SpO2 96%   BMI 50.42 kg/m     General appearance: healthy, alert, no acute distress  Derm: skin color, texture, turgor normal; no rashes or lesions  HEENT: atraumatic, normocephalic  Neuro: Alert and oriented, normal distal sensation  Vascular: Normal distal capillary and distal pulses    Left Knee Examination    Inspection:   Inspection of the knee reveals no ecchymosis or deformity.    Palpation:   There is moderate tenderness along the medial joint line.    Knee Effusion:   Mild effusion  Small Baker's cyst     Knee ROM (active and passive)  Extension: 5   Flexion: 110    Strength:  Hamstrings rated: 5/5  Quadriceps rated: 5/5    Ligament Exam:  Laxity w/ anterior drawer: none  Laxity w/ posterior drawer: none  Gapping w/ varus Stress: none  Gapping w/ valgus stress: none  Lachman: negative    Meniscal Testing:  MacMurray: negative    Special Testing:  Pain with Patellofemoral Compression: negative  Patellar Apprehension with Lateral translation: negative  J Sign: negative  Medial plica TTP: negative    Gait: Gait is normal.    Procedure Note: Cryotherapy with Liquid Nitrogen Application to Common/Plantar Warts (Verrucae vulgaris)    - Discussed etiology and progression/course as well as treatment with cryotherapy vs observation, patient/guardian elected cryotherapy, warned of blistering, scarring, skin discoloration. Patient/guardian understood the risks and wanted to proceed. Cryotherapy to lesions noted above. The patient tolerated the procedure well with no immediate side effects. Discussed post cryotherapy instructions.     PROCEDURE   PROCEDURE NOTE:   LEFT KNEE CORTICOSTEROID INJECTION    After comprehensively reviewing the potential risks and benefits of the procedure with the patient, verbal and written consent was obtained. The LEFT  knee was prepped with isopropyl alcohol and the skin was anesthetized with Ethyl Chloride  spray. Using a 1.5 inch 22 gauge needle, the LEFT knee joint was injected with 2 ml Bupivacaine  0.5% and 1 ml of Depomedrol 40mg /ml from the anterior-lateral approach. The injection site was then cleansed and dressing applied. Patient tolerated the procedure well without complication. Post-injection instructions were provided.      RADIOLOGY   X-ray images obtained and reviewed by myself in office:  Views  Weight-bearing AP, Notch, Lateral, Merchant, bilateral knee for comparison of alignment and joint space to detect clinically relevant asymmetry.  Impression: No evidence of fracture or dislocation.  Mild joint space narrowing Lateral and Patellofemoral compartment(s).      IMPRESSION     1. Primary osteoarthritis of left knee    2. Plantar wart        Diagnoses and all orders for this visit:    1. Primary osteoarthritis of left knee (Primary)  -     DRAIN/INJECT LARGE JOINT/BURSA    2. Plantar wart    -Right posterior thigh plantar war  -Treated with cryotherapy  -Post procedure instructions reviewed  -Can repeat procedure at upcoming OV   -F/u PRN   PLAN     AMEENA VESEY is a 68 year old female who presents today with the complaint of acute left knee pain. The patient's history, exam findings, and, if performed, imaging findings are consistent with a diagnosis of  left  knee osteoarthritis. We discussed the pathogenesis of osteoarthritis including the progressive loss of cartilage that leads to progressive joint/soft tissue degradation.  We discussed potential treatment options including injection therapy, scheduled anti-inflammatories, and HEP/PT referral.     Babs was interested in proceeding with left corticosteroid injection. We discussed indications, and risks/benefits of the procedure. We discussed the risks including bleeding, infection, damage to surrounding structures, hypopigmentation, fat atrophy, and allergic reactions. Anna tolerated the procedure well with no immediate adverse reaction. Post  injection instructions were reviewed.     In addition to the above procedure, I have recommended the following additional management steps:   -Tylenol  1000 mg q6 PRN for breakthrough pain   -In regards to therapeutic exercises, Physician directed home exercise program    Follow up in 6-8 weeks if symptoms persist or sooner if symptoms worsen/change.     Education was provided and all questions  answered regarding her condition. JODY AGUINAGA is in agreement with this plan, and had no further questions or concerns. If any new questions should arise she was instructed to contact our office.     Follow up in Return if symptoms worsen or fail to improve. If symptoms would worse or change in the interim, I have recommended returning to the office earlier for further evaluation.     Not including time spent on performing the procedure, I have personally spent at least 40 minutes  preparing to see the patient, reviewing past medical history including relevant documentation, laboratory studies and imaging studies from other providers, speaking with the patient, performing an appropriate evaluation of the patient, ordering tests, managing medications, and documenting clinical information in the chart.  Alysia Jumbo, D.O.  [facility]  Family Medicine  Sports Medicine  04/20/2024     Electronically signed by Travis Friedman, DO at 04/20/2024 11:38 PM EDT

## 2024-04-20 NOTE — Progress Notes (Signed)
 Formatting of this note might be different from the original.  Rooming Notes  04/20/2024   4:22 PM     Rachael Mays is a 68 year old female who presents today for Sports Medicine: Left knee .    Brief HPI:  - Plantar wart on the back right thigh    Labwork/testing completed: Yes (XRAY)    Influenza Vaccine (September-March): Not currently indicated, outside of flu season    Maudine Sos, LPN   09/81/1914    Electronically signed by Maudine Sos, LPN at 78/29/5621 11:38 PM EDT

## 2024-04-22 MED ORDER — TORSEMIDE 20 MG PO TABS
20 | ORAL_TABLET | ORAL | 0 refills | 30.00000 days | Status: DC
Start: 2024-04-22 — End: 2024-04-25

## 2024-04-25 MED ORDER — TORSEMIDE 20 MG PO TABS
20 | ORAL_TABLET | ORAL | 2 refills | 30.00000 days | Status: DC
Start: 2024-04-25 — End: 2024-06-05

## 2024-04-26 NOTE — Telephone Encounter (Signed)
 Requested Prescriptions     Pending Prescriptions Disp Refills    isosorbide  mononitrate (IMDUR ) 30 MG extended release tablet 90 tablet 3     Sig: Take 1 tablet by mouth daily      Last OV:  06/04/2023 [facility]    Next OV: 06/05/2024 NPTS    Last Labs: 02/02/2024 CMP    Last Filled: 05/18/2023 [facility]

## 2024-04-27 MED ORDER — ISOSORBIDE MONONITRATE ER 30 MG PO TB24
30 | ORAL_TABLET | Freq: Every day | ORAL | 3 refills | 30.00000 days | Status: DC
Start: 2024-04-27 — End: 2024-09-05

## 2024-05-02 ENCOUNTER — Ambulatory Visit: Admit: 2024-05-02 | Discharge: 2024-05-02 | Payer: MEDICARE | Attending: Dermatology | Primary: Sports Medicine

## 2024-05-02 DIAGNOSIS — L219 Seborrheic dermatitis, unspecified: Secondary | ICD-10-CM

## 2024-05-02 NOTE — Progress Notes (Signed)
 [facility]  Goldie Later, MD  [phone]      Rachael Mays  12-17-1955    68 y.o. female     Date of Visit: 05/02/2024    Chief Complaint: Seborrheic dermatitis, skin moles    History of Present Illness:    1.  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  She reports improvement but not complete resolution with the use of clobetasol  solution for scalp involvement and triamcinolone  0.1% cream for left ear involvement.    2.  She also has multiple moles on the trunk and extremities-not aware of any concerning changes.    3.  She reports a couple of lesions on the right hip.     4.  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 Shann Darnel on 12/29/2017.     Review of Systems:  Gen: Feels well, good sense of health.    Past Medical History, Family History, Surgical History, Medications and Allergies reviewed.    Past Medical History:   Diagnosis Date    AKI (acute kidney injury) 01/26/2022    Anxiety     Asthma     Blood circulation, collateral     CAD (coronary artery disease)     CHF (congestive heart failure) (HCC)     Chronic back pain     Deep vein thrombosis     Degeneration of thoracic intervertebral disc 10/15/2017    Depression     GERD (gastroesophageal reflux disease)     NO LONGER SINCE LAP    GERD (gastroesophageal reflux disease) 01/23/2009    Hx of blood clots     Hyperlipidemia     hx; resolved with lap band    Hypertension     Idiopathic peripheral neuropathy 03/26/2017    Melanoma (HCC)     upper back    MRSA (methicillin resistant staph aureus) culture positive 10/17/2019    leg abscess    Obesity     hx of; had lap band    Obstructive sleep apnea 09/18/2015    uses bi-pap    Obstructive sleep apnea (adult) (pediatric) 09/18/2015    Pulmonary embolism (HCC)     Stenosis of right carotid artery 06/16/2021    Type 2 diabetes  mellitus with diabetic polyneuropathy, without long-term current use of insulin  (HCC) 01/26/2022     Past Surgical History:   Procedure Laterality Date    BACK SURGERY      neck  plates    BREAST SURGERY      left lumpectomy    CERVICAL FUSION      CESAREAN SECTION      CHOLECYSTECTOMY      COLONOSCOPY      COLONOSCOPY  04/08/2010    COLONOSCOPY N/A 04/18/2021    COLONOSCOPY DIAGNOSTIC performed by Devorah Fonder, MD at [facility]    CORONARY ANGIOPLASTY  03/31/2016    mid LAD 3.25x18 alpine    DIAGNOSTIC CARDIAC CATH LAB PROCEDURE  08/09/2019    normal    DILATATION, ESOPHAGUS      ENDOSCOPY, COLON, DIAGNOSTIC      HYSTERECTOMY (CERVIX STATUS UNKNOWN)      HYSTERECTOMY, VAGINAL      LAP BAND  05/01/2008    Dr. Ledell Prudent    OTHER SURGICAL HISTORY      [place]  Filter: curently in place    OTHER SURGICAL HISTORY  07/05/2013    lap band removal    SHOULDER SURGERY      SKIN CANCER EXCISION  12/29/2017    UPPER GASTROINTESTINAL ENDOSCOPY  05/19/2013    UPPER GASTROINTESTINAL ENDOSCOPY  07/21/2013    ESOPHAGEAL STENT PLACEMENT    UPPER GASTROINTESTINAL ENDOSCOPY N/A 07/31/2014    Esophagogastroduodenoscopy with esophageal balloon dilation    UPPER GASTROINTESTINAL ENDOSCOPY N/A 04/18/2021    EGD DILATION BALLOON performed by Devorah Fonder, MD at [facility] ENDOSCOPY    VASCULAR SURGERY  04/2015    Benedetta Bradley, celiac artery angiogram, normal abd arteries       Allergies   Allergen Reactions    Environmental/Seasonal     Ibuprofen       Due to lap band erosion    Lisinopril       Memory loss     Talwin [Pentazocine] Other (See Comments)     dizzy     Outpatient Medications Marked as Taking for the 05/02/24 encounter (Office Visit) with Alfreda Imus, MD   Medication Sig Dispense Refill    isosorbide  mononitrate (IMDUR ) 30 MG extended release tablet Take 1 tablet by mouth daily 90 tablet 3    torsemide  (DEMADEX ) 20 MG tablet TAKE 1 TABLET BY MOUTH EVERY MORNING, AT NOON, AND AT BEDTIME 270 tablet 2    spironolactone   (ALDACTONE ) 25 MG tablet Take 1 tablet by mouth daily 30 tablet 3    betamethasone , augmented, (DIPROLENE ) 0.05 % lotion Apply topically to the scalp daily as needed for itching/rash. 60 mL 2    dilTIAZem  (CARDIZEM  CD) 240 MG extended release capsule Take 1 capsule by mouth daily 90 capsule 1    empagliflozin  (JARDIANCE ) 10 MG tablet Take 1 tablet by mouth daily 90 tablet 1    torsemide  (DEMADEX ) 20 MG tablet TAKE 2 TABLETS BY MOUTH EVERY MORNING, 1 TABLET AT NOON, AND 1 TABLET EVERY EVENING 90 tablet 0    torsemide  (DEMADEX ) 20 MG tablet TAKE 1 TABLET BY MOUTH IN THE MORNING, AT NOON, AND AT BEDTIME 90 tablet 0    torsemide  (DEMADEX ) 20 MG tablet Take one tablets in the morning  one tablet in the evening 90 tablet 1    Tirzepatide (MOUNJARO SC) Inject 5 mg into the skin every 7 days      Blood Pressure KIT 1 Device by Does not apply route 2 times daily at 0800 and 1400 1 kit 0    Blood Pressure Monitoring (BLOOD PRESSURE CUFF) MISC 1 Device by Does not apply route daily 1 each 0    Blood Pressure Monitoring (BLOOD PRESSURE CUFF) MISC 1 Device by Does not apply route in the morning and at bedtime 1 each 0    ranolazine  (RANEXA ) 500 MG extended release tablet Take 1 tablet by mouth 2 times daily 180 tablet 3    triamcinolone  (KENALOG ) 0.1 % cream Apply to the affected areas of the ears twice daily for up to 2 weeks or until improved. 15 g 2    gabapentin  (NEURONTIN ) 300 MG capsule Take 1 capsule by mouth nightly.      Magnesium  400 MG CAPS Take by mouth daily      nitroGLYCERIN  (NITROSTAT ) 0.4 MG SL tablet up to max of 3 total doses. If no relief after 1 dose, call 911. 25 tablet 3    triamcinolone  (KENALOG ) 0.1 % cream Apply to affected area twice daily for up to 2 weeks or  until improved. 15 g 2    montelukast  (SINGULAIR ) 10 MG tablet TAKE ONE TABLET BY MOUTH DAILY. **MUST CALL MD FOR APPOINTMENT 30 tablet 5    oxyCODONE -acetaminophen  (PERCOCET ) 7.5-325 MG per tablet as needed.      rosuvastatin  (CRESTOR ) 20 MG  tablet daily      Coenzyme Q10 (CO Q10) 200 MG CAPS Take by mouth daily (with breakfast)      calcium  citrate-vitamin D  (CITRICAL + D) 315-250 MG-UNIT TABS per tablet Take 2 tablets by mouth 2 times daily      Cholecalciferol  (VITAMIN D3) 50 MCG (2000 UT) CAPS Take 1 capsule by mouth daily 90 capsule 3    albuterol  sulfate HFA (PROVENTIL  HFA) 108 (90 Base) MCG/ACT inhaler Inhale 2 puffs into the lungs every 6 hours as needed for Wheezing 3 Inhaler 3    lidocaine  (LIDODERM ) 5 % Place 1 patch onto the skin daily 12 hours on, 12 hours off. 30 patch 0    aspirin  81 MG EC tablet Take 1 tablet by mouth daily 30 tablet 11    Multiple Vitamins-Minerals (MULTIVITAMIN PO) Take 1 tablet by mouth daily            Physical Examination       Well appearing.    1.  Scalp clear.  Left conchal bowl with mild erythema and scaling.     2. Trunk and extremities with multiple well defined round to oval smooth brown macules and papules.     3.  Right hip with few pedunculated tan papules.     4.  Right central upper back with a linear surgical scar.         Assessment and Plan     1. Seborrheic dermatitis of the scalp and left ear - overall doing well    Continue clobetasol  solution daily as needed for scalp involvement.     Continue triamcinolone  0.1% cream twice daily as needed for left ear involvement.      2. Multiple nevi - benign appearing    Sun protective behaviors, including use of at least SPF 30 sunscreen, and self skin examinations were encouraged.  Call for any new or concerning lesions.       3. Cutaneous skin tags     Reassurance.      4. History of stage IA melanoma of the right central upper back - no signs of recurrence.     Sun protective behaviors, including use of at least SPF 30 sunscreen, and self skin examinations were encouraged.  Call for any new or concerning lesions.           Return in about 6 months (around 11/02/2024).    --Alfreda Imus, MD

## 2024-05-15 NOTE — Telephone Encounter (Signed)
 Formatting of this note is different from the original.  Last OV or last med check/physical: 04/20/24  Last refill, quantity, number refill, class:   Crestor  40mg , 08/16/23, #90, 2 refills  Pending OV: 08/14/24, 12/12/24, 04/16/25  Pharmacy Verified: [facility]  Last OARRS (if indicated):   CHOLESTEROL (mg/dL)   Date Value   95/70/7974 149     HDL CHOLESTEROL (mg/dL)   Date Value   95/70/7974 62     LDL CHOLESTEROL (CALCULATED) (mg/dL)   Date Value   95/70/7974 69     TRIGLYCERIDE (mg/dL)   Date Value   95/70/7974 101       Electronically signed by Ascencion Rosina SAUNDERS, LPN at 93/90/7974  8:43 AM EDT

## 2024-05-20 ENCOUNTER — Encounter

## 2024-05-22 MED ORDER — RANOLAZINE ER 500 MG PO TB12
500 | ORAL_TABLET | Freq: Two times a day (BID) | ORAL | 3 refills | 30.00000 days | Status: AC
Start: 2024-05-22 — End: ?

## 2024-05-22 NOTE — Telephone Encounter (Signed)
 Requested Prescriptions     Pending Prescriptions Disp Refills    ranolazine  (RANEXA ) 500 MG extended release tablet 180 tablet 3     Sig: Take 1 tablet by mouth 2 times daily      Last OV:  06/04/2023 [facility]    Next OV: 06/05/2024 [facility]    Last ekg: 04/05/2023    Last Filled: 05/28/2023 [facility]

## 2024-05-27 ENCOUNTER — Encounter

## 2024-05-29 ENCOUNTER — Encounter

## 2024-05-30 ENCOUNTER — Ambulatory Visit: Admit: 2024-05-30 | Discharge: 2024-05-30 | Payer: MEDICARE | Attending: Internal Medicine | Primary: Sports Medicine

## 2024-05-30 DIAGNOSIS — I10 Essential (primary) hypertension: Secondary | ICD-10-CM

## 2024-05-30 LAB — BASIC METABOLIC PANEL
Anion Gap: 12 (ref 3–16)
BUN: 22 mg/dL — ABNORMAL HIGH (ref 7–20)
CO2: 27 mmol/L (ref 21–32)
Calcium: 10.5 mg/dL (ref 8.3–10.6)
Chloride: 103 mmol/L (ref 99–110)
Creatinine: 1.4 mg/dL — ABNORMAL HIGH (ref 0.6–1.2)
Est, Glom Filt Rate: 42 — AB (ref >60)
Glucose: 135 mg/dL — ABNORMAL HIGH (ref 70–99)
Potassium: 4.3 mmol/L (ref 3.5–5.1)
Sodium: 142 mmol/L (ref 136–145)

## 2024-05-30 LAB — PTH, INTACT: Pth Intact: 34.8 pg/mL (ref 14.0–72.0)

## 2024-05-30 LAB — VITAMIN D 25 HYDROXY: Vit D, 25-Hydroxy: 56.9 ng/mL (ref >=30)

## 2024-05-30 LAB — ALBUMIN/CREATININE RATIO, URINE
Albumin Urine: <1.2 mg/dL (ref <2.0)
Creatinine, Ur: 41.8 mg/dL (ref 28.0–259.0)

## 2024-05-30 LAB — CBC WITH AUTO DIFFERENTIAL
Basophils %: 0.6 %
Basophils Absolute: 0.1 10*3/uL (ref 0.0–0.2)
Eosinophils %: 1.5 %
Eosinophils Absolute: 0.1 10*3/uL (ref 0.0–0.6)
Hematocrit: 43.6 % (ref 36.0–48.0)
Hemoglobin: 14.8 g/dL (ref 12.0–16.0)
Lymphocytes %: 32.3 %
Lymphocytes Absolute: 2.7 10*3/uL (ref 1.0–5.1)
MCH: 29.8 pg (ref 26.0–34.0)
MCHC: 33.9 g/dL (ref 31.0–36.0)
MCV: 87.8 fL (ref 80.0–100.0)
MPV: 9.8 fL (ref 5.0–10.5)
Monocytes %: 8.3 %
Monocytes Absolute: 0.7 10*3/uL (ref 0.0–1.3)
Neutrophils %: 57.3 %
Neutrophils Absolute: 4.8 10*3/uL (ref 1.7–7.7)
Platelets: 187 10*3/uL (ref 135–450)
RBC: 4.97 M/uL (ref 4.00–5.20)
RDW: 15.1 % (ref 12.4–15.4)
WBC: 8.4 10*3/uL (ref 4.0–11.0)

## 2024-05-30 LAB — PROTEIN / CREATININE RATIO, URINE
Creatinine, Ur: 42.1 mg/dL (ref 28.0–259.0)
Protein, Ur: 6 mg/dL (ref <12)
Protein/Creat Ratio: 0.1 mg/dL

## 2024-05-30 LAB — PHOSPHORUS: Phosphorus: 3.9 mg/dL (ref 2.5–4.9)

## 2024-05-30 NOTE — Telephone Encounter (Signed)
 This was sent to [facility] 1 week ago, please confirm they received

## 2024-05-30 NOTE — Progress Notes (Signed)
 Office : [phone]     Fax :[phone]      Nephrology progress Note        History of Present Illness:    This is a 68 y.o. female who presents to the office for evaluation of  worsening renal function .  H/o DM 2 - none   H/o HTN - now BP better after loss of weight   H/o Renal stones - none   H/o renal disease in family   H/o CAD - h/o Stent in past. H/o CHF   CHF?     H/o smoking .    Today visit 05/30/24  Swelligm is better  Down to 1 Torsemide  daily   No updated labs   Pt started Mounjaro   .  Swelling improved once Torsemide  increased to 20 mg bid   Cr 1.4 6/25<-- 1.4<-- 1.55 10/24<-- 1.6 10/24<- 1.4 8/22  <--1.2 7/24: base line   Lytes good  GFR 50 --> 40 --> 36 --> 42--> 42  Echo 9/24: G1DD   Ca wnl 10.4   CBC wnl  UACR UPCR wnl 6/25   Meds: jardaince, Dilt , Indur, Aldactone , Torsmide   Previous labs:   ANA FLC C3 C4 all wnl   UACR neg   UM blnad   BP - home - she does n ot check check routinly     Appetite reduced and she feels bloated   She has not seen her surgeon   Pt is diagnsoed with HFpEF and mild pulm HTN   Pt denies CP/palpitations/abdominal pain//V.   Pt denies fever/ chills  Pt denies dysuria or hematuria   Low sodium   N0 NSAIDS     Heart cath neg   BP at home 130/70  Meds include Torsemide , Dilt, Imdur  , Jardiance    BP at home not controlled recently     Pt is on TOrsemide  20 mg tid   Also SPironolaconte Imdur  Dilt   BP at home usually ok       Hemoglobin A1C   Date Value Ref Range Status   03/31/2023 5.5 See comment % Final     Comment:     Comment:  Diagnosis of Diabetes: > or = 6.5%  Increased risk of diabetes (Prediabetes): 5.7-6.4%  Glycemic Control: Nonpregnant Adults: <7.0%                    Pregnant: <6.0%           Recently stopped lisinopril  .   Pt is on spironolactone   , Dilt, Imdur , Torsemide      Previous labs:   Cr 1.3- 1.5 9/23  <-- 1.6 7/23<--1.4 4/23<--1. 3/23<--1.3<--2.0 (2/23) <--1.8 1/23<--1.4 8/22<--1.1 3/22<-- normal 2021 <--base  line 1.0-1.1     GFR 44  Lytes ok   UPCR 0.07  UACR - neg 7/23  UA- no RBC WBC   HCV neg   CT ABD 5/23: KIDNEYS/URETERS:  Right renal cyst.  No hydronephrosis, stone, or suspicious mass . ADRENAL LESION  Mri 9/23: 1.3 cm right adrenal nodule demonstrates signal characteristics consistent with an adenoma.   S/p CTPA 2/22  Echo 7/23:    systolic function with an estimated ejection fraction of 55-60%.   No regional wall motion abnormalities are seen.   Normal diastolic function.E/e=11.35.    No usage of NSAIDS     Past Medical History:        Diagnosis Date    AKI (acute kidney injury) 01/26/2022  Anxiety     Asthma     Blood circulation, collateral     CAD (coronary artery disease)     CHF (congestive heart failure) (HCC)     Chronic back pain     Deep vein thrombosis     Degeneration of thoracic intervertebral disc 10/15/2017    Depression     GERD (gastroesophageal reflux disease)     NO LONGER SINCE LAP    GERD (gastroesophageal reflux disease) 01/23/2009    Hx of blood clots     Hyperlipidemia     hx; resolved with lap band    Hypertension     Idiopathic peripheral neuropathy 03/26/2017    Melanoma (HCC)     upper back    MRSA (methicillin resistant staph aureus) culture positive 10/17/2019    leg abscess    Obesity     hx of; had lap band    Obstructive sleep apnea 09/18/2015    uses bi-pap    Obstructive sleep apnea (adult) (pediatric) 09/18/2015    Pulmonary embolism (HCC)     Stenosis of right carotid artery 06/16/2021    Type 2 diabetes mellitus with diabetic polyneuropathy, without long-term current use of insulin  (HCC) 01/26/2022       Past Surgical History:        Procedure Laterality Date    BACK SURGERY      neck  plates    BREAST SURGERY      left lumpectomy    CERVICAL FUSION      CESAREAN SECTION      CHOLECYSTECTOMY      COLONOSCOPY      COLONOSCOPY  04/08/2010    COLONOSCOPY N/A 04/18/2021    COLONOSCOPY DIAGNOSTIC performed by Garnette SHAUNNA Lunger, MD at [facility] END[facility]    CORONARY ANGIOPLASTY   03/31/2016    mid LAD 3.25x18 alpine    DIAGNOSTIC CARDIAC CATH LAB PROCEDURE  08/09/2019    normal    DILATATION, ESOPHAGUS      ENDOSCOPY, COLON, DIAGNOSTIC      HYSTERECTOMY (CERVIX STATUS UNKNOWN)      HYSTERECTOMY, VAGINAL      LAP BAND  05/01/2008    Dr. Brady    OTHER SURGICAL HISTORY      [place] Filter: curently in place    OTHER SURGICAL HISTORY  07/05/2013    lap band removal    SHOULDER SURGERY      SKIN CANCER EXCISION  12/29/2017    UPPER GASTROINTESTINAL ENDOSCOPY  05/19/2013    UPPER GASTROINTESTINAL ENDOSCOPY  07/21/2013    ESOPHAGEAL STENT PLACEMENT    UPPER GASTROINTESTINAL ENDOSCOPY N/A 07/31/2014    Esophagogastroduodenoscopy with esophageal balloon dilation    UPPER GASTROINTESTINAL ENDOSCOPY N/A 04/18/2021    EGD DILATION BALLOON performed by Garnette SHAUNNA Lunger, MD at [facility]    VASCULAR SURGERY  04/2015    Viveca, celiac artery angiogram, normal abd arteries       Current Medications:    Current Outpatient Medications   Medication Sig Dispense Refill    ranolazine  (RANEXA ) 500 MG extended release tablet Take 1 tablet by mouth 2 times daily 180 tablet 3    isosorbide  mononitrate (IMDUR ) 30 MG extended release tablet Take 1 tablet by mouth daily 90 tablet 3    torsemide  (DEMADEX ) 20 MG tablet TAKE 1 TABLET BY MOUTH EVERY MORNING, AT NOON, AND AT BEDTIME 270 tablet 2    spironolactone  (ALDACTONE ) 25 MG tablet Take 1 tablet by mouth  daily 30 tablet 3    betamethasone , augmented, (DIPROLENE ) 0.05 % lotion Apply topically to the scalp daily as needed for itching/rash. 60 mL 2    dilTIAZem  (CARDIZEM  CD) 240 MG extended release capsule Take 1 capsule by mouth daily 90 capsule 1    empagliflozin  (JARDIANCE ) 10 MG tablet Take 1 tablet by mouth daily 90 tablet 1    torsemide  (DEMADEX ) 20 MG tablet TAKE 2 TABLETS BY MOUTH EVERY MORNING, 1 TABLET AT NOON, AND 1 TABLET EVERY EVENING 90 tablet 0    torsemide  (DEMADEX ) 20 MG tablet TAKE 1 TABLET BY MOUTH IN THE MORNING, AT NOON, AND AT BEDTIME  90 tablet 0    torsemide  (DEMADEX ) 20 MG tablet Take one tablets in the morning  one tablet in the evening 90 tablet 1    clobetasol  (TEMOVATE ) 0.05 % external solution Apply a small amount daily to the affected areas of the scalp as needed for itching. (Patient not taking: Reported on 05/02/2024) 50 mL 2    Tirzepatide (MOUNJARO SC) Inject 5 mg into the skin every 7 days      Blood Pressure KIT 1 Device by Does not apply route 2 times daily at 0800 and 1400 1 kit 0    Blood Pressure Monitoring (BLOOD PRESSURE CUFF) MISC 1 Device by Does not apply route daily 1 each 0    Blood Pressure Monitoring (BLOOD PRESSURE CUFF) MISC 1 Device by Does not apply route in the morning and at bedtime 1 each 0    triamcinolone  (KENALOG ) 0.1 % cream Apply to the affected areas of the ears twice daily for up to 2 weeks or until improved. 15 g 2    gabapentin  (NEURONTIN ) 300 MG capsule Take 1 capsule by mouth nightly.      Magnesium  400 MG CAPS Take by mouth daily      nitroGLYCERIN  (NITROSTAT ) 0.4 MG SL tablet up to max of 3 total doses. If no relief after 1 dose, call 911. 25 tablet 3    clobetasol  (TEMOVATE ) 0.05 % cream Apply to affected area twice daily for up to 2 weeks or until improved. (Patient not taking: Reported on 05/02/2024) 60 g 1    omeprazole  (PRILOSEC ) 40 MG delayed release capsule TAKE ONE CAPSULE BY MOUTH DAILY (Patient not taking: Reported on 05/02/2024) 30 capsule 0    triamcinolone  (KENALOG ) 0.1 % cream Apply to affected area twice daily for up to 2 weeks or until improved. 15 g 2    montelukast  (SINGULAIR ) 10 MG tablet TAKE ONE TABLET BY MOUTH DAILY. **MUST CALL MD FOR APPOINTMENT 30 tablet 5    oxyCODONE -acetaminophen  (PERCOCET ) 7.5-325 MG per tablet as needed.      rosuvastatin  (CRESTOR ) 20 MG tablet daily      Coenzyme Q10 (CO Q10) 200 MG CAPS Take by mouth daily (with breakfast)      calcium  citrate-vitamin D  (CITRICAL + D) 315-250 MG-UNIT TABS per tablet Take 2 tablets by mouth 2 times daily       Cholecalciferol  (VITAMIN D3) 50 MCG (2000 UT) CAPS Take 1 capsule by mouth daily 90 capsule 3    albuterol  sulfate HFA (PROVENTIL  HFA) 108 (90 Base) MCG/ACT inhaler Inhale 2 puffs into the lungs every 6 hours as needed for Wheezing 3 Inhaler 3    lidocaine  (LIDODERM ) 5 % Place 1 patch onto the skin daily 12 hours on, 12 hours off. 30 patch 0    aspirin  81 MG EC tablet Take 1 tablet by mouth daily  30 tablet 11    Multiple Vitamins-Minerals (MULTIVITAMIN PO) Take 1 tablet by mouth daily        No current facility-administered medications for this visit.       Allergies:  Environmental/seasonal, Ibuprofen , Lisinopril , and Talwin [pentazocine]    Social History:   Social History     Socioeconomic History    Marital status: Divorced     Spouse name: [name]    Number of children: 2    Years of education: 12    Highest education level: Not on file   Occupational History    Occupation: Retired   Tobacco Use    Smoking status: Former     Current packs/day: 0.00     Average packs/day: 0.5 packs/day for 40.0 years (20.0 ttl pk-yrs)     Types: Cigarettes     Start date: 08/07/1972     Quit date: 08/07/2012     Years since quitting: 11.8    Smokeless tobacco: Never   Vaping Use    Vaping status: Never Used   Substance and Sexual Activity    Alcohol use: No    Drug use: No    Sexual activity: Not Currently     Partners: Male   Other Topics Concern    Not on file   Social History Narrative    Works as Conservation officer, nature at [employer]: Low Risk  (05/15/2020)    Overall Financial Resource Strain (CARDIA)     Difficulty of Paying Living Expenses: Not hard at all   Food Insecurity: No Food Insecurity (03/31/2023)    Hunger Vital Sign     Worried About Running Out of Food in the Last Year: Never true     Ran Out of Food in the Last Year: Never true   Transportation Needs: No Transportation Needs (03/31/2023)    PRAPARE - Therapist, art (Medical): No     Lack of  Transportation (Non-Medical): No   Physical Activity: Not on file   Stress: Not on file   Social Connections: Not on file   Intimate Partner Violence: Unknown (12/25/2022)    Received from [employer] and [employer], [employer] traffic [employer] and [employer]     Feel physically or emotionally unsafe where currently live: Not on file     Harm by anyone: Not on file     Emotionally Harmed: Not on file   Housing Stability: Low Risk  (03/31/2023)    Housing Stability Vital Sign     Unable to Pay for Housing in the Last Year: No     Number of Places Lived in the Last Year: 1     Unstable Housing in the Last Year: No       Family History:   Family History   Problem Relation Age of Onset    Arthritis Mother     Cancer Mother     Depression Mother     High Blood Pressure Mother     Heart Disease Mother 18        MI     Ovarian Cancer Mother 14    Cancer Father 59        colon    Other Sister         OSA    Cancer Sister  lung liver    Other Brother         OSA    Sleep Apnea Brother     Other Brother         OSA    Sleep Apnea Brother     Heart Disease Maternal Grandmother     Early Death Paternal Grandmother     Heart Disease Paternal Grandfather     Other Daughter         OSA    Diabetes Other     High Blood Pressure Other     Obesity Other        Review of Systems:    Constitutional: No fever, no chills, no lethargy, no weakness.  HEENT:  No headache,  sore throat.  Cardiac:  No chest pain, ++, orthopnea or PND.  Chest:              No cough, phlegm or wheezing.  Abdomen:  No abdominal pain, nausea or vomiting.  Neuro:                No focal weakness, abnormal movements orseizure like activity.  Skin:   No rashes, no itching.  GU:   No hematuria,, no dysuria, no flank pain.  Extremities:  +2 swelling       Objective:  BP (!) 147/76   Pulse 66   Wt 134.7 kg (297 lb)   BMI 49.42 kg/m     Physical Exam:  General appearance:Awake, alert, in no acute distress  Skin: warm and dry,  no rash or erythema    Eyes: conjunctivae normal and sclera anicteric    ENT: :no swelling     Neck: without any JVD. No carotid bruits or thyromegaly.      Pulmonary: b/l rales     Cardiovascular:Normal S1 & S2, No S3 or  S4, No  Pericardial rub , No Murmur     Abdomen: soft nontender, bowel sounds present, no organomegaly,  No ascites      Extremities: no cyanosis,no edema    Labs:    CBC:   Lab Results   Component Value Date/Time    WBC 8.4 05/29/2024 03:33 PM    RBC 4.97 05/29/2024 03:33 PM    HGB 14.8 05/29/2024 03:33 PM    HCT 43.6 05/29/2024 03:33 PM    MCV 87.8 05/29/2024 03:33 PM    RDW 15.1 05/29/2024 03:33 PM    PLT 187 05/29/2024 03:33 PM    MPV 9.8 05/29/2024 03:33 PM      BMP:   Lab Results   Component Value Date/Time    NA 142 05/29/2024 03:33 PM    NA 141 02/02/2024 12:56 PM    NA 139 10/04/2023 01:15 PM    K 4.3 05/29/2024 03:33 PM    K 4.7 02/02/2024 12:56 PM    K 4.5 10/04/2023 01:15 PM    K 4.6 10/17/2019 06:07 AM    K 3.9 05/05/2017 03:21 AM    CL 103 05/29/2024 03:33 PM    CL 104 02/02/2024 12:56 PM    CL 99 10/04/2023 01:15 PM    CO2 27 05/29/2024 03:33 PM    CO2 28 02/02/2024 12:56 PM    CO2 26 10/04/2023 01:15 PM    BUN 22 05/29/2024 03:33 PM    BUN 15 02/02/2024 12:56 PM    BUN 21 10/04/2023 01:15 PM    CREATININE 1.4 05/29/2024 03:33 PM    CREATININE 1.4 02/02/2024 12:56 PM  CREATININE 1.4 10/04/2023 01:15 PM    GLUCOSE 135 05/29/2024 03:33 PM    GLUCOSE 103 02/02/2024 12:56 PM    GLUCOSE 127 10/04/2023 01:15 PM    CALCIUM  10.5 05/29/2024 03:33 PM    CALCIUM  9.9 02/02/2024 12:56 PM    CALCIUM  10.3 10/04/2023 01:15 PM      BNP:No results found for: BNP  PHOSPHORUS:    Lab Results   Component Value Date/Time    PHOS 3.9 05/29/2024 03:33 PM    PHOS 3.8 08/21/2013 03:46 PM    PHOS 3.3 08/18/2013 08:13 AM     MAGNESIUM :   Lab Results   Component Value Date/Time    MG 1.90 04/05/2023 04:41 AM     ALBUMIN:   No results found for: LABALBU    IRON:    Lab Results   Component Value Date/Time     IRON 93 03/26/2010 05:35 PM     IRON SATURATION:    No components found for: LABIRON    TIBC:    Lab Results   Component Value Date/Time    TIBC 397 03/26/2010 05:35 PM     FERRITIN:    Lab Results   Component Value Date/Time    FERRITIN 61.9 05/02/2009 05:40 PM     ANA:   Lab Results   Component Value Date    ANA Negative 10/04/2023       SPEP:   Lab Results   Component Value Date/Time    GAMGLOB 1.5 10/04/2023 01:15 PM     UPEP: No results found for: TPU   HEPBSAG:No results found for: HEPBSAG  HEPCAB:No results found for: HEPCAB  C3:   Lab Results   Component Value Date    C3 175.0 10/04/2023     C4:   Lab Results   Component Value Date    C4 22.0 10/04/2023     MPO ANCA: No results found for: MPO .  PR3 ANCA:  No results found for: PR3  PTH: No results found for: PTH    Urine Creatinine:    No results found for: LABCREA    Urine Eosinophils: No results found for: UREO  Urine Protein:  No results found for: TPU  Urinalysis:  U/A:   Lab Results   Component Value Date/Time    NITRU Negative 10/04/2023 01:16 PM    COLORU Yellow 10/04/2023 01:16 PM    PHUR 6.0 10/04/2023 01:16 PM    PHUR 7.0 03/31/2023 04:01 PM    LABCAST 0-1 Hyaline 07/09/2013 02:09 AM    WBCUA 1 10/04/2023 01:16 PM    RBCUA 1 10/04/2023 01:16 PM    RBCUA NEGATIVE 02/13/2021 03:03 PM    MUCUS PRESENT 02/13/2021 03:03 PM    YEAST 0 11/25/2017 04:24 PM    BACTERIA None Seen 10/04/2023 01:16 PM    CLARITYU Clear 10/04/2023 01:16 PM    LEUKOCYTESUR Negative 10/04/2023 01:16 PM    UROBILINOGEN 0.2 10/04/2023 01:16 PM    BILIRUBINUR Negative 10/04/2023 01:16 PM    BLOODU Negative 10/04/2023 01:16 PM    GLUCOSEU >=1000 10/04/2023 01:16 PM    KETUA Negative 10/04/2023 01:16 PM         Radiology:  Reviewed as available.    Assessment/Plan:    CKD stage 3B sus CRS  No updaed labs   Creat ~ 1.4   --> 1.6 --> 1.4 --> 1.4  Neg basic work up for GN   No proteinuria of hemturia: risk of GN low   No  proteinuria   Torsemide  20 mg   Edema  better but still, edematous   No NSAIDS  On low sodium diet   Off  Metfrmin   Off PPU   FLC SPEP- normal   Likley CRS   Plan:  No need for RAASi  No changes in meds   Labs before next visit        H/o CHF but EF improved, CAD  No edema   Keep  torsemide  crrent dose   Monitor BP   Low sodium diet  No NSAIDS  Increase Torsemide       H/o adrenal nodule . CT scan in 2020  MRI- benign nodsule   Down the rpad- recheck renin/ Aldosterone     HTN - fluctuate : mildly elevated here   BP slightly high here -  do HBPM   Spironolactone  renewed   Keep Torsemide    Discussed low salt diet  No NSAIDS  Counseling done for diet and lifestyle changes.  Explained importance of compliance with medications     Omega Salvia, MD  Nephrology     [facility] Office : [address], [address] , [place] [address]  [facility] Office: [address], [place], [place] [address]  [facility] Office: [address], [place] [address].  [facility] Office: [address] [place] [address]  Office : [phone]  Fax :[phone]     Discussed w/ MD- Metformin discontinued

## 2024-05-30 NOTE — Telephone Encounter (Signed)
 [facility] and verified they have a refill ready for the Ranolazine  to be picked up.

## 2024-06-05 ENCOUNTER — Ambulatory Visit: Admit: 2024-06-05 | Discharge: 2024-06-05 | Payer: MEDICARE | Attending: Adult Health | Primary: Sports Medicine

## 2024-06-05 VITALS — BP 114/66 | HR 67 | Ht 65.0 in | Wt 300.0 lb

## 2024-06-05 DIAGNOSIS — I5032 Chronic diastolic (congestive) heart failure: Principal | ICD-10-CM

## 2024-06-05 DIAGNOSIS — I2510 Atherosclerotic heart disease of native coronary artery without angina pectoris: Secondary | ICD-10-CM

## 2024-06-05 MED ORDER — METOPROLOL SUCCINATE ER 25 MG PO TB24
25 | ORAL_TABLET | Freq: Every day | ORAL | 3 refills | 90.00000 days | Status: DC
Start: 2024-06-05 — End: 2024-07-29

## 2024-06-05 NOTE — Patient Instructions (Signed)
 Begin toprol  / metoprolol  succinate 25 mg daily     Labs today (may be a candidate for Wegovy instead of Mounjaro)    Appt in three months

## 2024-06-05 NOTE — Progress Notes (Signed)
 [facility]     Outpatient Follow Up Note    Rachael Mays is 68 y.o. female who presents today with a history of CAD s/p PTCA LAD April '17, d-HF, DVT/PE, s/p Greenfield filter, HTN and hyperlipidemia.        CHIEF COMPLAINT / HPI:  Follow Up secondary to coronary artery disease    Subjective:   Last week she had some chest discomfort. It started in her left chest then traveled across to the Rt. It radiated to her LUE. She has associated SOB.  It lasted a couple of hours. She took 2 NTG SL. She was watching her grandkids of 2 yrs (special needs) old and 35 months old.   She guess that she'll have an episodes every other week. Sometimes it happens at rest.    She takes GLP1 but her stomach feels hard. She started on it about 6 months ago. She lost 25-30# and weighs 294#.    There is SOB described as having trouble getting a deep breath. She uses proAir  but doesn't seem to help. She tries her BiPAP and relax.     She's SOB some walking.   The patient denies orthopnea/PND. She sleeps with a BiPAP. The patient has no swelling in her legs / feet.      The patient is not experiencing palpitations or dizziness.     These symptoms are improving since the last hospitalization.   With regard to medication therapy the patient has been compliant with prescribed regimen. They have tolerated therapy to date.     Past Medical History:   Diagnosis Date    AKI (acute kidney injury) 01/26/2022    Anxiety     Asthma     Blood circulation, collateral     CAD (coronary artery disease)     CHF (congestive heart failure) (HCC)     Chronic back pain     Deep vein thrombosis     Degeneration of thoracic intervertebral disc 10/15/2017    Depression     GERD (gastroesophageal reflux disease)     NO LONGER SINCE LAP    GERD (gastroesophageal reflux disease) 01/23/2009    Hx of blood clots     Hyperlipidemia     hx; resolved with lap band    Hypertension     Idiopathic peripheral neuropathy 03/26/2017    Melanoma (HCC)     upper back     MRSA (methicillin resistant staph aureus) culture positive 10/17/2019    leg abscess    Obesity     hx of; had lap band    Obstructive sleep apnea 09/18/2015    uses bi-pap    Obstructive sleep apnea (adult) (pediatric) 09/18/2015    Pulmonary embolism (HCC)     Stenosis of right carotid artery 06/16/2021    Type 2 diabetes mellitus with diabetic polyneuropathy, without long-term current use of insulin  (HCC) 01/26/2022     Social History:    Social History     Tobacco Use   Smoking Status Former    Current packs/day: 0.00    Average packs/day: 0.5 packs/day for 40.0 years (20.0 ttl pk-yrs)    Types: Cigarettes    Start date: 08/07/1972    Quit date: 08/07/2012    Years since quitting: 11.8   Smokeless Tobacco Never     Current Medications:  Current Outpatient Medications   Medication Sig Dispense Refill    ranolazine  (RANEXA ) 500 MG extended release tablet Take 1 tablet by  mouth 2 times daily 180 tablet 3    isosorbide  mononitrate (IMDUR ) 30 MG extended release tablet Take 1 tablet by mouth daily 90 tablet 3    spironolactone  (ALDACTONE ) 25 MG tablet Take 1 tablet by mouth daily 30 tablet 3    betamethasone , augmented, (DIPROLENE ) 0.05 % lotion Apply topically to the scalp daily as needed for itching/rash. 60 mL 2    dilTIAZem  (CARDIZEM  CD) 240 MG extended release capsule Take 1 capsule by mouth daily 90 capsule 1    empagliflozin  (JARDIANCE ) 10 MG tablet Take 1 tablet by mouth daily 90 tablet 1    torsemide  (DEMADEX ) 20 MG tablet Take one tablets in the morning  one tablet in the evening 90 tablet 1    Tirzepatide (MOUNJARO SC) Inject 5 mg into the skin every 7 days      Blood Pressure KIT 1 Device by Does not apply route 2 times daily at 0800 and 1400 1 kit 0    Blood Pressure Monitoring (BLOOD PRESSURE CUFF) MISC 1 Device by Does not apply route daily 1 each 0    Blood Pressure Monitoring (BLOOD PRESSURE CUFF) MISC 1 Device by Does not apply route in the morning and at bedtime 1 each 0    gabapentin  (NEURONTIN ) 300  MG capsule Take 1 capsule by mouth nightly.      Magnesium  400 MG CAPS Take by mouth daily      nitroGLYCERIN  (NITROSTAT ) 0.4 MG SL tablet up to max of 3 total doses. If no relief after 1 dose, call 911. 25 tablet 3    triamcinolone  (KENALOG ) 0.1 % cream Apply to affected area twice daily for up to 2 weeks or until improved. 15 g 2    montelukast  (SINGULAIR ) 10 MG tablet TAKE ONE TABLET BY MOUTH DAILY. **MUST CALL MD FOR APPOINTMENT 30 tablet 5    oxyCODONE -acetaminophen  (PERCOCET ) 7.5-325 MG per tablet as needed.      rosuvastatin  (CRESTOR ) 20 MG tablet daily      Coenzyme Q10 (CO Q10) 200 MG CAPS Take by mouth daily (with breakfast)      calcium  citrate-vitamin D  (CITRICAL + D) 315-250 MG-UNIT TABS per tablet Take 2 tablets by mouth 2 times daily      Cholecalciferol  (VITAMIN D3) 50 MCG (2000 UT) CAPS Take 1 capsule by mouth daily 90 capsule 3    albuterol  sulfate HFA (PROVENTIL  HFA) 108 (90 Base) MCG/ACT inhaler Inhale 2 puffs into the lungs every 6 hours as needed for Wheezing 3 Inhaler 3    lidocaine  (LIDODERM ) 5 % Place 1 patch onto the skin daily 12 hours on, 12 hours off. 30 patch 0    aspirin  81 MG EC tablet Take 1 tablet by mouth daily 30 tablet 11    Multiple Vitamins-Minerals (MULTIVITAMIN PO) Take 1 tablet by mouth daily        No current facility-administered medications for this visit.     REVIEW OF SYSTEMS:    CONSTITUTIONAL: + major weight loss, - fatigue, weakness, night sweats or fever.  HEENT: No new vision difficulties or ringing in the ears.  RESPIRATORY: No new SOB, PND, orthopnea or cough.   CARDIOVASCULAR: See HPI  GI: No nausea, vomiting, diarrhea, constipation, abdominal pain or changes in bowel habits.  GU: No urinary frequency, urgency, incontinence hematuria or dysuria.  SKIN: No cyanosis or skin lesions.  MUSCULOSKELETAL: No new muscle or joint pain.  NEUROLOGICAL: No syncope or TIA-like symptoms.  PSYCHIATRIC: No anxiety, pain, insomnia or depression  Objective:   PHYSICAL EXAM:           VITALS:  BP 114/66 (BP Site: Right Upper Arm, Patient Position: Sitting, BP Cuff Size: Large Adult)   Pulse 67   Ht 1.651 m (5' 5)   Wt 136.1 kg (300 lb)   SpO2 97%   BMI 49.92 kg/m   CONSTITUTIONAL: Cooperative, no apparent distress, and appears well nourished / obese  NEUROLOGIC:  Awake and orientated to person, place and time.  PSYCH: Calm affect.  SKIN: Warm and dry.  HEENT: Sclera non-icteric, normocephalic, neck supple, no elevation of JVP, normal carotid pulses with no bruits and thyroid normal size.  LUNGS:  No increased work of breathing and clear to auscultation, no crackles or wheezing  CARDIOVASCULAR:  Regular rate 72 and rhythm with no murmurs, gallops, rubs, or abnormal heart sounds, normal PMI.The apical impulses not displaced  JVP less than 8 cm H2O  Heart tones are crisp and normal  Cervical veins are not engorged  The carotid upstroke is normal in amplitude and contour without delay or bruit  JVP is not elevated  ABDOMEN:  Normal bowel sounds, non-distended and non-tender to palpation  EXT: bil LE edema, no calf tenderness. Pulses are present bilaterally.    DATA:    Lab Results   Component Value Date    ALT 22 02/02/2024    AST 26 02/02/2024    ALKPHOS 87 02/02/2024    BILITOT 0.4 02/02/2024     Lab Results   Component Value Date    CREATININE 1.4 (H) 05/29/2024    BUN 22 (H) 05/29/2024    NA 142 05/29/2024    K 4.3 05/29/2024    CL 103 05/29/2024    CO2 27 05/29/2024     Lab Results   Component Value Date    TSH 1.37 03/31/2023     Lab Results   Component Value Date    WBC 8.4 05/29/2024    HGB 14.8 05/29/2024    HCT 43.6 05/29/2024    MCV 87.8 05/29/2024    PLT 187 05/29/2024     No components found for: CHLPL  Lab Results   Component Value Date    TRIG 180 (H) 04/01/2023    TRIG 116 02/13/2021    TRIG 134 08/09/2019     Lab Results   Component Value Date    HDL 54 04/01/2023    HDL 57 02/18/2022    HDL 48 (L) 02/13/2021     Radiology Review:  Pertinent images / reports were  reviewed as a part of this visit and reveals the following:    Echo: 04/01/23  Summary   Technically difficult study due to body habitus. Definity  was administered.   Left ventricular cavity size is normal with normal left ventricular wall   thickness. Ejection fraction is visually estimated to be 55-60%. Grade I   diastolic dysfunction with normal LV filling pressures. E/e' = 11.1.   Non-coronary cusp appears restricted.   Mild aortic stenosis with a peak velocity of 2.79m/s and a mean pressure   gradient of . AVA (VTI): 2.18 cm.2.   RVSP is estimated to be 21-24 mmHG.   IVC not well visualized.   Right atrium is not well visualized, but appears normal in size.   Right ventricle is not well visualized, but appears dilated.   There appears to be some calcification of the posterior mitral valve leaflet   without evidence of stenosis.    Angiogram: 04/05/23  ANGIOGRAM/CORONARY ARTERIOGRAM:     Left main artery Bifurcates into the left anterior descending artery and left circumflex artery nml   Left anterior descending artery Gives rise to 2 diagonal arteries Mid stent patent   Diagonals   nml   Left circumflex artery Non-dominant vessel that gives rise to 2 obtuse marginal arteries nml   Obtuse Marginals   nml      Right coronary artery Dominant vessel that gives rise to the posterior descending artery and posterolateral branch nml   Posterior descending artery Posterior lateral branch   nml  nml      Left ventricular angiogram was done in the 30 RAO projection and revealed  LVEF- 60%  LVG- nml  LVEDP- 19  Aortic pressure- 113/68 There was no gradient between the left ventricle and aorta  IMPRESSION/SUMMARY  ~Coronary Angiography w/ patent stent  ~LVG with LVEF of 60% and no regional wall motion abnormalities    Echo: Sept '24:      Left Ventricle: Normal left ventricular systolic function with a visually estimated EF of 60 - 65%. EF by 2D Simpsons Biplane is 64%. Left ventricle size is normal. Findings consistent  with moderate concentric hypertrophy. Normal wall motion. Grade I diastolic dysfunction with normal LAP.    Aortic Valve: Mildly calcified cusps. Mild stenosis of the aortic valve. AV mean gradient is 14 mmHg. AV area by continuity VTI is 1.6 cm2.    Mitral Valve: Mild annular calcification at the posterior leaflet.    Image quality is technically difficult. Contrast used: Definity . Technically difficult study with poor endocardial visualization.    Assessment:      Diagnosis Orders   1. Coronary artery disease due to lipid rich plaque   ~stable : atypical chest discomfort  ~s/p cardiac cath : patent stent LAD  ~ASA / nitrate / Ranexa  / dilt        2. Primary hypertension   ~controlled   ~grade I DD on echo Sept '24  ~hx of treated OSA       3. Dyslipidemia   ~trig not at goal on profile from April '24  ~LDL at goal  ~crestor  20 mg daily        4. Chronic diastolic congestive heart failure (HCC)   ~stable : grade I DD ; treated OSA  ~ bil LE edema  ~Jardiance  10 mg daily / spironolactone  25 mg daily / demadex  20 mg bid  ~medications with SE swelling : gabapentin  / diltiazem           I had the opportunity to review the clinical symptoms and presentation of Rachael Mays.   Plan:     Begin toprol  25 mg daily : GDMT  BNP today : consider changing Mounjaro to [facility] (indication in HF)  F/U in three months    Overall the patient is stable from CV standpoint    I have addresed the patient's cardiac risk factors and adjusted pharmacologic treatment as needed. In addition, I have reinforced the need for patient directed risk factor modification.    Further evaluation will be based upon the patient's clinical course and testing results.    All questions and concerns were addressed to the patient. Alternatives to my treatment were discussed.      The patient is not currently smoking.     Patient is not on a beta-blocker : EF 55%  Patient is not on an ace-i/ARB : CKD followed by Dr. Crayton  Patient is on a  statin    Antiplatelet therapy has been recommended / prescribed for this patient. Education conducted on adverse reactions including bleeding was discussed.    Daily weight, low sodium diet were discussed. Patient instructed to call the office with a weight gain: > 3 # over night or 5# in one week; swelling, SOB/orthopnea/PND    The patient verbalizes understanding not to stop medications without discussing with us .    Discussed exercise: 30-60 minutes 7 days/week  Discussed Low saturated fat/NAS diet.     Thank you for allowing to us  to participate in the care of Rachael Mays.    Emmie Comfort, APRN    Documentation of today's visit sent to PCP

## 2024-06-06 ENCOUNTER — Encounter

## 2024-06-07 LAB — BRAIN NATRIURETIC PEPTIDE: NT Pro-BNP: 41 pg/mL (ref 0–124)

## 2024-06-12 NOTE — Telephone Encounter (Signed)
-----   Message from Emmie Comfort, APRN - CNP sent at 06/07/2024  3:45 PM EDT -----  Looks ok; normal

## 2024-06-12 NOTE — Telephone Encounter (Signed)
 I spoke with pt and relayed lab results per NPTS. Pt verbalized understanding.

## 2024-07-31 ENCOUNTER — Encounter

## 2024-07-31 MED ORDER — SPIRONOLACTONE 25 MG PO TABS
25 | ORAL_TABLET | Freq: Every day | ORAL | 1 refills | Status: AC
Start: 2024-07-31 — End: ?

## 2024-07-31 MED ORDER — TORSEMIDE 20 MG PO TABS
20 | ORAL_TABLET | ORAL | 1 refills | 30.00000 days | Status: DC
Start: 2024-07-31 — End: 2024-09-08

## 2024-07-31 NOTE — Telephone Encounter (Signed)
 Received refill request for    metoprolol  succinate (TOPROL  XL) 25 MG    from [facility].     Last OV: 06/05/24    Next OV:     Last Labs:     Last Filled: 06/05/24

## 2024-08-02 MED ORDER — METOPROLOL SUCCINATE ER 25 MG PO TB24
25 | ORAL_TABLET | Freq: Every day | ORAL | 3 refills | 30.00000 days | Status: DC
Start: 2024-08-02 — End: 2024-11-29

## 2024-08-20 ENCOUNTER — Encounter

## 2024-08-21 MED ORDER — DILTIAZEM HCL ER COATED BEADS 240 MG PO CP24
240 | ORAL_CAPSULE | Freq: Every day | ORAL | 1 refills | 88.50000 days | Status: AC
Start: 2024-08-21 — End: ?

## 2024-08-21 MED ORDER — JARDIANCE 10 MG PO TABS
10 | ORAL_TABLET | Freq: Every day | ORAL | 1 refills | 30.00000 days | Status: AC
Start: 2024-08-21 — End: ?

## 2024-08-21 NOTE — Telephone Encounter (Signed)
 Medication Refill    Medication needing refilled:  dilTIAZem      Dosage of the medication:   240mg     How are you taking this medication (QD, BID, TID, QID, PRN):    Take 1 capsule by mouth daily     30 or 90 day supply called in:  90    When will you run out of your medication:    Which Pharmacy are we sending the medication to?:     [id number]  [address]  [place] [address]  Phone: [phone]  Fax: [phone]

## 2024-08-21 NOTE — Telephone Encounter (Signed)
 Order is already pended. To different encounter.

## 2024-08-21 NOTE — Telephone Encounter (Signed)
 Requested Prescriptions     Pending Prescriptions Disp Refills    dilTIAZem  (CARDIZEM  CD) 240 MG extended release capsule [Pharmacy Med Name: dilTIAZem  24H ER (CD) 240 MG CP] 90 capsule 1     Sig: TAKE 1 CAPSULE BY MOUTH DAILY      LAST OV: 06/05/2024   NEXT OV: 09/05/2024

## 2024-08-22 NOTE — Telephone Encounter (Signed)
 Pt just returned from [place] . Has red spot above left breast. Has hx of melanoma and was told to call if has any more red spots. Interested in video visit if appropriate.

## 2024-08-23 NOTE — Telephone Encounter (Signed)
 Scheduled appt 9/23 at 1300

## 2024-08-29 ENCOUNTER — Ambulatory Visit: Admit: 2024-08-29 | Discharge: 2024-08-29 | Payer: MEDICARE | Attending: Dermatology | Primary: Sports Medicine

## 2024-08-29 DIAGNOSIS — L821 Other seborrheic keratosis: Secondary | ICD-10-CM

## 2024-08-29 NOTE — Progress Notes (Signed)
 [facility]  Rachael Craft, MD  [phone]      Rachael Mays  [birth year]    68 y.o. female     Date of Visit: 08/29/2024    Chief Complaint: skin skin lesions of concern    History of Present Illness:    1.  She complains of 2 newly noted itchy lesions on the chest.      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 Gasper on 12/29/2017.       Review of Systems:  Gen: Feels well, good sense of health.    Past Medical History, Family History, Surgical History, Medications and Allergies reviewed.    Past Medical History:   Diagnosis Date    AKI (acute kidney injury) 01/26/2022    Anxiety     Asthma     Blood circulation, collateral     CAD (coronary artery disease)     CHF (congestive heart failure) (HCC)     Chronic back pain     Deep vein thrombosis     Degeneration of thoracic intervertebral disc 10/15/2017    Depression     GERD (gastroesophageal reflux disease)     NO LONGER SINCE LAP    GERD (gastroesophageal reflux disease) 01/23/2009    Hx of blood clots     Hyperlipidemia     hx; resolved with lap band    Hypertension     Idiopathic peripheral neuropathy 03/26/2017    Melanoma (HCC)     upper back    MRSA (methicillin resistant staph aureus) culture positive 10/17/2019    leg abscess    Obesity     hx of; had lap band    Obstructive sleep apnea 09/18/2015    uses bi-pap    Obstructive sleep apnea (adult) (pediatric) 09/18/2015    Pulmonary embolism (HCC)     Stenosis of right carotid artery 06/16/2021    Type 2 diabetes mellitus with diabetic polyneuropathy, without long-term current use of insulin  (HCC) 01/26/2022     Past Surgical History:   Procedure Laterality Date    BACK SURGERY      neck  plates    BREAST SURGERY      left lumpectomy    CERVICAL FUSION      CESAREAN SECTION      CHOLECYSTECTOMY      COLONOSCOPY      COLONOSCOPY  04/08/2010    COLONOSCOPY N/A 04/18/2021     COLONOSCOPY DIAGNOSTIC performed by Garnette SHAUNNA Lunger, MD at [facility] END[facility]COPY    CORONARY ANGIOPLASTY  03/31/2016    mid LAD 3.25x18 alpine    DIAGNOSTIC CARDIAC CATH LAB PROCEDURE  08/09/2019    normal    DILATATION, ESOPHAGUS      ENDOSCOPY, COLON, DIAGNOSTIC      HYSTERECTOMY (CERVIX STATUS UNKNOWN)      HYSTERECTOMY, VAGINAL      LAP BAND  05/01/2008    Dr. Brady    OTHER SURGICAL HISTORY      [place] Filter: curently in place    OTHER SURGICAL HISTORY  07/05/2013    lap band removal    SHOULDER SURGERY      SKIN CANCER EXCISION  12/29/2017    UPPER GASTROINTESTINAL ENDOSCOPY  05/19/2013    UPPER GASTROINTESTINAL ENDOSCOPY  07/21/2013    ESOPHAGEAL STENT PLACEMENT    UPPER GASTROINTESTINAL ENDOSCOPY N/A  07/31/2014    Esophagogastroduodenoscopy with esophageal balloon dilation    UPPER GASTROINTESTINAL ENDOSCOPY N/A 04/18/2021    EGD DILATION BALLOON performed by Garnette SHAUNNA Lunger, MD at [facility] ENDOSCOPY    VASCULAR SURGERY  04/2015    Viveca, celiac artery angiogram, normal abd arteries       Allergies   Allergen Reactions    Environmental/Seasonal     Ibuprofen       Due to lap band erosion    Lisinopril       Memory loss     Talwin [Pentazocine] Other (See Comments)     dizzy     Outpatient Medications Marked as Taking for the 08/29/24 encounter (Office Visit) with Gladis Rachael PARAS, MD   Medication Sig Dispense Refill    JARDIANCE  10 MG tablet TAKE 1 TABLET BY MOUTH DAILY 90 tablet 1    dilTIAZem  (CARDIZEM  CD) 240 MG extended release capsule TAKE 1 CAPSULE BY MOUTH DAILY 90 capsule 1    metoprolol  succinate (TOPROL  XL) 25 MG extended release tablet Take 1 tablet by mouth daily 30 tablet 3    torsemide  (DEMADEX ) 20 MG tablet Take one tablets in the morning  one tablet in the evening 270 tablet 1    spironolactone  (ALDACTONE ) 25 MG tablet Take 1 tablet by mouth daily 90 tablet 1    ranolazine  (RANEXA ) 500 MG extended release tablet Take 1 tablet by mouth 2 times daily 180 tablet 3    isosorbide  mononitrate  (IMDUR ) 30 MG extended release tablet Take 1 tablet by mouth daily 90 tablet 3    betamethasone , augmented, (DIPROLENE ) 0.05 % lotion Apply topically to the scalp daily as needed for itching/rash. 60 mL 2    Tirzepatide (MOUNJARO SC) Inject 5 mg into the skin every 7 days      Blood Pressure KIT 1 Device by Does not apply route 2 times daily at 0800 and 1400 1 kit 0    Blood Pressure Monitoring (BLOOD PRESSURE CUFF) MISC 1 Device by Does not apply route daily 1 each 0    Blood Pressure Monitoring (BLOOD PRESSURE CUFF) MISC 1 Device by Does not apply route in the morning and at bedtime 1 each 0    gabapentin  (NEURONTIN ) 300 MG capsule Take 1 capsule by mouth nightly.      Magnesium  400 MG CAPS Take by mouth daily      nitroGLYCERIN  (NITROSTAT ) 0.4 MG SL tablet up to max of 3 total doses. If no relief after 1 dose, call 911. 25 tablet 3    triamcinolone  (KENALOG ) 0.1 % cream Apply to affected area twice daily for up to 2 weeks or until improved. 15 g 2    montelukast  (SINGULAIR ) 10 MG tablet TAKE ONE TABLET BY MOUTH DAILY. **MUST CALL MD FOR APPOINTMENT 30 tablet 5    oxyCODONE -acetaminophen  (PERCOCET ) 7.5-325 MG per tablet as needed.      rosuvastatin  (CRESTOR ) 20 MG tablet daily      Coenzyme Q10 (CO Q10) 200 MG CAPS Take by mouth daily (with breakfast)      calcium  citrate-vitamin D  (CITRICAL + D) 315-250 MG-UNIT TABS per tablet Take 2 tablets by mouth 2 times daily      Cholecalciferol  (VITAMIN D3) 50 MCG (2000 UT) CAPS Take 1 capsule by mouth daily 90 capsule 3    albuterol  sulfate HFA (PROVENTIL  HFA) 108 (90 Base) MCG/ACT inhaler Inhale 2 puffs into the lungs every 6 hours as needed for Wheezing 3 Inhaler 3  lidocaine  (LIDODERM ) 5 % Place 1 patch onto the skin daily 12 hours on, 12 hours off. 30 patch 0    aspirin  81 MG EC tablet Take 1 tablet by mouth daily 30 tablet 11    Multiple Vitamins-Minerals (MULTIVITAMIN PO) Take 1 tablet by mouth daily            Physical Examination     Well appearing.    1.  Left  upper chest with 2 stuck on appearing waxy brown papules.      2.  Lower neck with several small pedunculated brown papules.         Assessment and Plan     1. SK (seborrheic keratosis)     Reassurance.      2. Cutaneous skin tags     Reassurance.          --Rachael JINNY Craft, MD

## 2024-09-05 ENCOUNTER — Ambulatory Visit: Admit: 2024-09-05 | Discharge: 2024-09-05 | Payer: MEDICARE | Attending: Adult Health | Primary: Sports Medicine

## 2024-09-05 VITALS — BP 118/64 | HR 60 | Ht 65.0 in | Wt 309.0 lb

## 2024-09-05 DIAGNOSIS — I2510 Atherosclerotic heart disease of native coronary artery without angina pectoris: Secondary | ICD-10-CM

## 2024-09-05 DIAGNOSIS — R0789 Other chest pain: Principal | ICD-10-CM

## 2024-09-05 MED ORDER — ISOSORBIDE MONONITRATE ER 60 MG PO TB24
60 | ORAL_TABLET | Freq: Every day | ORAL | 2 refills | 30.00000 days | Status: AC
Start: 2024-09-05 — End: ?

## 2024-09-05 NOTE — Progress Notes (Signed)
 [facility]     Outpatient Follow Up Note    Rachael Mays is 68 y.o. female who presents today with a history of CAD s/p PTCA LAD April '17, d-HF, DVT/PE, s/p Greenfield filter, HTN and hyperlipidemia.        CHIEF COMPLAINT / HPI:  Follow Up secondary to coronary artery disease    Subjective:     A few days ago she started having issues with SOB / dizzy / nausea and numbness in her Lt arm.  She has the arm numbness / nausea together. She's taken NTG SL x2 which hasn't helped.    She's had SOB on/off but gets worse when her symptoms flare up. She feels like she did before her stent. She's tried using her inhaler which hasn't helped.    She has an episode every day of some type that she attributes to her heart.    She had dizziness two days ago. She slept and after she woke it was gone. The room spins. She has vertigo but takes nothing for it.      The patient denies orthopnea/PND. She sleeps with a BiPAP. The patient has no swelling in her legs / feet.    The patient is not experiencing palpitations or dizziness.     These symptoms are stable since the last OV.   With regard to medication therapy the patient has been compliant with prescribed regimen. They have tolerated therapy to date.     Past Medical History:   Diagnosis Date    AKI (acute kidney injury) 01/26/2022    Anxiety     Asthma     Blood circulation, collateral     CAD (coronary artery disease)     CHF (congestive heart failure) (HCC)     Chronic back pain     Deep vein thrombosis     Degeneration of thoracic intervertebral disc 10/15/2017    Depression     GERD (gastroesophageal reflux disease)     NO LONGER SINCE LAP    GERD (gastroesophageal reflux disease) 01/23/2009    Hx of blood clots     Hyperlipidemia     hx; resolved with lap band    Hypertension     Idiopathic peripheral neuropathy 03/26/2017    Melanoma (HCC)     upper back    MRSA (methicillin resistant staph aureus) culture positive 10/17/2019    leg abscess    Obesity     hx  of; had lap band    Obstructive sleep apnea 09/18/2015    uses bi-pap    Obstructive sleep apnea (adult) (pediatric) 09/18/2015    Pulmonary embolism (HCC)     Stenosis of right carotid artery 06/16/2021    Type 2 diabetes mellitus with diabetic polyneuropathy, without long-term current use of insulin  (HCC) 01/26/2022     Social History:    Social History     Tobacco Use   Smoking Status Former    Current packs/day: 0.00    Average packs/day: 0.5 packs/day for 40.0 years (20.0 ttl pk-yrs)    Types: Cigarettes    Start date: 08/07/1972    Quit date: 08/07/2012    Years since quitting: 12.0   Smokeless Tobacco Never     Current Medications:  Current Outpatient Medications   Medication Sig Dispense Refill    JARDIANCE  10 MG tablet TAKE 1 TABLET BY MOUTH DAILY 90 tablet 1    dilTIAZem  (CARDIZEM  CD) 240 MG extended release capsule TAKE 1  CAPSULE BY MOUTH DAILY 90 capsule 1    metoprolol  succinate (TOPROL  XL) 25 MG extended release tablet Take 1 tablet by mouth daily 30 tablet 3    torsemide  (DEMADEX ) 20 MG tablet Take one tablets in the morning  one tablet in the evening 270 tablet 1    spironolactone  (ALDACTONE ) 25 MG tablet Take 1 tablet by mouth daily 90 tablet 1    ranolazine  (RANEXA ) 500 MG extended release tablet Take 1 tablet by mouth 2 times daily 180 tablet 3    isosorbide  mononitrate (IMDUR ) 30 MG extended release tablet Take 1 tablet by mouth daily 90 tablet 3    betamethasone , augmented, (DIPROLENE ) 0.05 % lotion Apply topically to the scalp daily as needed for itching/rash. 60 mL 2    Tirzepatide (MOUNJARO SC) Inject 5 mg into the skin every 7 days      gabapentin  (NEURONTIN ) 300 MG capsule Take 1 capsule by mouth nightly.      Magnesium  400 MG CAPS Take by mouth daily      triamcinolone  (KENALOG ) 0.1 % cream Apply to affected area twice daily for up to 2 weeks or until improved. 15 g 2    montelukast  (SINGULAIR ) 10 MG tablet TAKE ONE TABLET BY MOUTH DAILY. **MUST CALL MD FOR APPOINTMENT 30 tablet 5     oxyCODONE -acetaminophen  (PERCOCET ) 7.5-325 MG per tablet as needed.      rosuvastatin  (CRESTOR ) 20 MG tablet daily      Coenzyme Q10 (CO Q10) 200 MG CAPS Take by mouth daily (with breakfast)      calcium  citrate-vitamin D  (CITRICAL + D) 315-250 MG-UNIT TABS per tablet Take 2 tablets by mouth 2 times daily      Cholecalciferol  (VITAMIN D3) 50 MCG (2000 UT) CAPS Take 1 capsule by mouth daily 90 capsule 3    albuterol  sulfate HFA (PROVENTIL  HFA) 108 (90 Base) MCG/ACT inhaler Inhale 2 puffs into the lungs every 6 hours as needed for Wheezing 3 Inhaler 3    lidocaine  (LIDODERM ) 5 % Place 1 patch onto the skin daily 12 hours on, 12 hours off. 30 patch 0    aspirin  81 MG EC tablet Take 1 tablet by mouth daily 30 tablet 11    Multiple Vitamins-Minerals (MULTIVITAMIN PO) Take 1 tablet by mouth daily       Blood Pressure KIT 1 Device by Does not apply route 2 times daily at 0800 and 1400 1 kit 0    Blood Pressure Monitoring (BLOOD PRESSURE CUFF) MISC 1 Device by Does not apply route daily 1 each 0    Blood Pressure Monitoring (BLOOD PRESSURE CUFF) MISC 1 Device by Does not apply route in the morning and at bedtime 1 each 0    nitroGLYCERIN  (NITROSTAT ) 0.4 MG SL tablet up to max of 3 total doses. If no relief after 1 dose, call 911. (Patient not taking: Reported on 09/05/2024) 25 tablet 3     No current facility-administered medications for this visit.     REVIEW OF SYSTEMS:    CONSTITUTIONAL: + major weight loss, - fatigue, weakness, night sweats or fever.  HEENT: No new vision difficulties or ringing in the ears.  RESPIRATORY: No new SOB, PND, orthopnea or cough.   CARDIOVASCULAR: See HPI  GI: No nausea, vomiting, diarrhea, constipation, abdominal pain or changes in bowel habits.  GU: No urinary frequency, urgency, incontinence hematuria or dysuria.  SKIN: No cyanosis or skin lesions.  MUSCULOSKELETAL: No new muscle or joint pain.  NEUROLOGICAL: No syncope  or TIA-like symptoms.  PSYCHIATRIC: No anxiety, pain, insomnia or  depression    Objective:   PHYSICAL EXAM:        VITALS:  BP 118/64 (BP Site: Left Upper Arm, Patient Position: Sitting, BP Cuff Size: Large Adult)   Pulse 60   Ht 1.651 m (5' 5)   Wt (!) 140.2 kg (309 lb)   SpO2 100%   BMI 51.42 kg/m   CONSTITUTIONAL: Cooperative, no apparent distress, and appears well nourished / obese  NEUROLOGIC:  Awake and orientated to person, place and time.  PSYCH: Calm affect.  SKIN: Warm and dry.  HEENT: Sclera non-icteric, normocephalic, neck supple, no elevation of JVP, normal carotid pulses with no bruits and thyroid normal size.  LUNGS:  No increased work of breathing and clear to auscultation, no crackles or wheezing  CARDIOVASCULAR:  Regular rate 64 and rhythm with no murmurs, gallops, rubs, or abnormal heart sounds, normal PMI.The apical impulses not displaced  JVP less than 8 cm H2O  Heart tones are crisp and normal  Cervical veins are not engorged  The carotid upstroke is normal in amplitude and contour without delay or bruit  JVP is not elevated  ABDOMEN:  Normal bowel sounds, non-distended and non-tender to palpation  EXT: no edema, no calf tenderness. Pulses are present bilaterally.    DATA:    Lab Results   Component Value Date    ALT 22 02/02/2024    AST 26 02/02/2024    ALKPHOS 87 02/02/2024    BILITOT 0.4 02/02/2024     Lab Results   Component Value Date    CREATININE 1.4 (H) 05/29/2024    BUN 22 (H) 05/29/2024    NA 142 05/29/2024    K 4.3 05/29/2024    CL 103 05/29/2024    CO2 27 05/29/2024     Lab Results   Component Value Date    TSH 1.37 03/31/2023     Lab Results   Component Value Date    WBC 8.4 05/29/2024    HGB 14.8 05/29/2024    HCT 43.6 05/29/2024    MCV 87.8 05/29/2024    PLT 187 05/29/2024     No components found for: CHLPL  Lab Results   Component Value Date    TRIG 180 (H) 04/01/2023    TRIG 116 02/13/2021    TRIG 134 08/09/2019     Lab Results   Component Value Date    HDL 54 04/01/2023    HDL 57 02/18/2022    HDL 48 (L) 02/13/2021     LABS:      04/04/24:  Component  Ref Range & Units 5 mo ago Comments   SODIUM  136 - 145 mmol/L 141    POTASSIUM  3.5 - 5.0 mmol/L 4.4    CHLORIDE  98 - 107 mmol/L 104    CO2  22 - 29 mmol/L 25    GLUCOSE, RANDOM  70 - 99 mg/dL 884 High     BLD UREA NITROGEN  8 - 23 mg/dL 21    CREATININE  9.48 - 1.30 mg/dL 8.48 High     CALCIUM   8.8 - 10.4 mg/dL 9.9    TOTAL PROTEIN  6.4 - 8.3 gm/dL 6.9    ALBUMIN  3.2 - 4.6 gm/dL 4.3    TOTAL BILIRUBIN  0.2 - 1.3 mg/dL 0.5    ALK PHOSPHATASE  36 - 123 U/L 96    AST  <=40 U/L 28    ALT  <=  41 U/L 21    ESTIMATED GFR  >=60 mL/min/1.73 m2 38 Low     ANION GAP  7 - 16 mmol/L 12      Component  Ref Range & Units 5 mo ago    CHOLESTEROL  <200 mg/dL 850    TRIGLYCERIDE  <849 mg/dL 898    HDL CHOLESTEROL  >=40 mg/dL 62    LDL CHOLESTEROL (CALCULATED)  <100 mg/dL 69    TOTAL NON HDL CHOL  <=129 mg/dL 87        Radiology Review:  Pertinent images / reports were reviewed as a part of this visit and reveals the following:    Echo: 04/01/23  Summary   Technically difficult study due to body habitus. Definity  was administered.   Left ventricular cavity size is normal with normal left ventricular wall   thickness. Ejection fraction is visually estimated to be 55-60%. Grade I   diastolic dysfunction with normal LV filling pressures. E/e' = 11.1.   Non-coronary cusp appears restricted.   Mild aortic stenosis with a peak velocity of 2.65m/s and a mean pressure   gradient of . AVA (VTI): 2.18 cm.2.   RVSP is estimated to be 21-24 mmHG.   IVC not well visualized.   Right atrium is not well visualized, but appears normal in size.   Right ventricle is not well visualized, but appears dilated.   There appears to be some calcification of the posterior mitral valve leaflet   without evidence of stenosis.    Angiogram: 04/05/23  ANGIOGRAM/CORONARY ARTERIOGRAM:     Left main artery Bifurcates into the left anterior descending artery and left circumflex artery nml   Left anterior descending artery Gives rise to 2  diagonal arteries Mid stent patent   Diagonals   nml   Left circumflex artery Non-dominant vessel that gives rise to 2 obtuse marginal arteries nml   Obtuse Marginals   nml      Right coronary artery Dominant vessel that gives rise to the posterior descending artery and posterolateral branch nml   Posterior descending artery Posterior lateral branch   nml  nml      Left ventricular angiogram was done in the 30 RAO projection and revealed  LVEF- 60%  LVG- nml  LVEDP- 19  Aortic pressure- 113/68 There was no gradient between the left ventricle and aorta  IMPRESSION/SUMMARY  ~Coronary Angiography w/ patent stent  ~LVG with LVEF of 60% and no regional wall motion abnormalities    Echo: Sept '24:      Left Ventricle: Normal left ventricular systolic function with a visually estimated EF of 60 - 65%. EF by 2D Simpsons Biplane is 64%. Left ventricle size is normal. Findings consistent with moderate concentric hypertrophy. Normal wall motion. Grade I diastolic dysfunction with normal LAP.    Aortic Valve: Mildly calcified cusps. Mild stenosis of the aortic valve. AV mean gradient is 14 mmHg. AV area by continuity VTI is 1.6 cm2.    Mitral Valve: Mild annular calcification at the posterior leaflet.    Image quality is technically difficult. Contrast used: Definity . Technically difficult study with poor endocardial visualization.    Assessment:      Diagnosis Orders   1. Coronary artery disease due to lipid rich plaque   ~atypical discomfort with arm numbness and nausea ; unrelieved with NTG SL  ~s/p cardiac cath : patent stent LAD  ~ASA / nitrate / Ranexa  / dilt        2. Primary hypertension   ~  controlled  ~grade I DD on echo Sept '24  ~hx of treated OSA  ~mod [facility]       3. Dyslipidemia   ~controlled on profile from Apri '25 : TC 149 trig 101 HDL 62 LDL 69  ~crestor  20 mg daily        4. Chronic diastolic congestive heart failure (HCC)   ~stable / compensated  ~last BNP 41  ~grade I DD ; treated OSA  ~Jardiance  10 mg daily  / spironolactone  25 mg daily / demadex  20 mg bid         I had the opportunity to review the clinical symptoms and presentation of LARUEN RISSER.   Plan:     EKG : sinus rhythm 60, no acute changes  Increase isosorbide  to 60 mg daily  Consider myoview  if nitrate ineffective  F/U in 4 weeks    Overall the patient is stable from CV standpoint    I have addresed the patient's cardiac risk factors and adjusted pharmacologic treatment as needed. In addition, I have reinforced the need for patient directed risk factor modification.    Further evaluation will be based upon the patient's clinical course and testing results.    All questions and concerns were addressed to the patient/husband. Alternatives to my treatment were discussed.      The patient is not currently smoking.     Patient is on a beta-blocker   Patient is not on an ace-i/ARB : CKD followed by Dr. Crayton  Patient is on a statin    Antiplatelet therapy has been recommended / prescribed for this patient. Education conducted on adverse reactions including bleeding was discussed.    Daily weight, low sodium diet were discussed. Patient instructed to call the office with a weight gain: > 3 # over night or 5# in one week; swelling, SOB/orthopnea/PND    The patient verbalizes understanding not to stop medications without discussing with us .    Discussed exercise: 30-60 minutes 7 days/week  Discussed Low saturated fat/NAS diet. A1c 5.6%     Thank you for allowing to us  to participate in the care of Rachael Mays.    Emmie Comfort, APRN    Documentation of today's visit sent to PCP

## 2024-09-05 NOTE — Patient Instructions (Addendum)
 You can take OTC meclizine  / antivert  for dizziness    Increase isosorbide  to 60 mg daily     Myoview  stress : reassess your heart's circulation if increasing isosorbide  doesn't help

## 2024-09-08 ENCOUNTER — Encounter

## 2024-09-08 MED ORDER — TORSEMIDE 20 MG PO TABS
20 | ORAL_TABLET | ORAL | 0 refills | 30.00000 days | Status: AC
Start: 2024-09-08 — End: ?

## 2024-09-08 NOTE — Telephone Encounter (Signed)
"  This script for isosorbide  mononitrate 30MG  written by Dr. Judythe was discontinued by Emmie Bodo on 09/05/24. Emmie wrote a new prescription for isosorbide  mononitrate 60MG  1 tablet daily on 09/05/24.  "

## 2024-09-08 NOTE — Telephone Encounter (Signed)
"  Last Office Visit 05/30/2024  Next Office Visit 10/05/2024    "

## 2024-10-05 ENCOUNTER — Encounter: Payer: MEDICARE | Attending: Adult Health | Primary: Sports Medicine

## 2024-10-11 ENCOUNTER — Ambulatory Visit: Admit: 2024-10-11 | Discharge: 2024-10-11 | Payer: MEDICARE | Attending: Adult Health | Primary: Sports Medicine

## 2024-10-11 VITALS — BP 118/60 | HR 59 | Ht 65.0 in | Wt 314.3 lb

## 2024-10-11 DIAGNOSIS — I2510 Atherosclerotic heart disease of native coronary artery without angina pectoris: Principal | ICD-10-CM

## 2024-10-11 NOTE — Patient Instructions (Addendum)
"  Myoview  stress test : reassess your heart's circulation     Appt in 4 months / test dependent  "

## 2024-10-11 NOTE — Progress Notes (Signed)
 "[facility]     Outpatient Follow Up Note    Rachael Mays is 68 y.o. female who presents today with a history of CAD s/p PTCA LAD April '17, d-HF, DVT/PE, s/p Greenfield filter, HTN and hyperlipidemia.        CHIEF COMPLAINT / HPI:  Follow Up secondary to coronary artery disease increasing isosorbide . She'd complained of having issues with SOB / dizzy / nausea and numbness in her Lt arm. She had the arm numbness / nausea together. She'd taken NTG SL x2 which hadn't helped.  She was feeling like she did before her stent.    Subjective:   Its helped with her chest pain and no longer has. Her left arm still goes numb and wakes her up at times (aches all the way to her fingertips). She hasn;t needed NTG SL since last seen  Her angina was CP and trouble breathing.     She becomes SOB after walking 5 minutes. She also has problems with neuropathy limiting the distance she can walk   The patient denies orthopnea/PND. She sleeps with a BiPAP. The patient has no swelling in her legs / feet.    The patient is not experiencing palpitations or dizziness.     These symptoms are stable since the last OV.   With regard to medication therapy the patient has been compliant with prescribed regimen. They have tolerated therapy to date.     Past Medical History:   Diagnosis Date    AKI (acute kidney injury) 01/26/2022    Anxiety     Asthma     Blood circulation, collateral     CAD (coronary artery disease)     CHF (congestive heart failure) (HCC)     Chronic back pain     Deep vein thrombosis     Degeneration of thoracic intervertebral disc 10/15/2017    Depression     GERD (gastroesophageal reflux disease)     NO LONGER SINCE LAP    GERD (gastroesophageal reflux disease) 01/23/2009    Hx of blood clots     Hyperlipidemia     hx; resolved with lap band    Hypertension     Idiopathic peripheral neuropathy 03/26/2017    Melanoma (HCC)     upper back    MRSA (methicillin resistant staph aureus) culture positive 10/17/2019     leg abscess    Obesity     hx of; had lap band    Obstructive sleep apnea 09/18/2015    uses bi-pap    Obstructive sleep apnea (adult) (pediatric) 09/18/2015    Pulmonary embolism (HCC)     Stenosis of right carotid artery 06/16/2021    Type 2 diabetes mellitus with diabetic polyneuropathy, without long-term current use of insulin  (HCC) 01/26/2022     Social History:    Social History     Tobacco Use   Smoking Status Former    Current packs/day: 0.00    Average packs/day: 0.5 packs/day for 40.0 years (20.0 ttl pk-yrs)    Types: Cigarettes    Start date: 08/07/1972    Quit date: 08/07/2012    Years since quitting: 12.1   Smokeless Tobacco Never     Current Medications:  Current Outpatient Medications   Medication Sig Dispense Refill    torsemide  (DEMADEX ) 20 MG tablet TAKE 1 TABLET BY MOUTH EVERY MORNING, AT NOON, AND AT BEDTIME 90 tablet 0    isosorbide  mononitrate (IMDUR ) 60 MG extended release tablet Take 1  tablet by mouth daily 90 tablet 2    JARDIANCE  10 MG tablet TAKE 1 TABLET BY MOUTH DAILY 90 tablet 1    dilTIAZem  (CARDIZEM  CD) 240 MG extended release capsule TAKE 1 CAPSULE BY MOUTH DAILY 90 capsule 1    metoprolol  succinate (TOPROL  XL) 25 MG extended release tablet Take 1 tablet by mouth daily 30 tablet 3    spironolactone  (ALDACTONE ) 25 MG tablet Take 1 tablet by mouth daily 90 tablet 1    ranolazine  (RANEXA ) 500 MG extended release tablet Take 1 tablet by mouth 2 times daily 180 tablet 3    betamethasone , augmented, (DIPROLENE ) 0.05 % lotion Apply topically to the scalp daily as needed for itching/rash. 60 mL 2    Tirzepatide (MOUNJARO SC) Inject 5 mg into the skin every 7 days      Blood Pressure KIT 1 Device by Does not apply route 2 times daily at 0800 and 1400 1 kit 0    Blood Pressure Monitoring (BLOOD PRESSURE CUFF) MISC 1 Device by Does not apply route daily 1 each 0    Blood Pressure Monitoring (BLOOD PRESSURE CUFF) MISC 1 Device by Does not apply route in the morning and at bedtime 1 each 0     gabapentin  (NEURONTIN ) 300 MG capsule Take 1 capsule by mouth nightly.      Magnesium  400 MG CAPS Take by mouth daily      nitroGLYCERIN  (NITROSTAT ) 0.4 MG SL tablet up to max of 3 total doses. If no relief after 1 dose, call 911. (Patient not taking: Reported on 09/05/2024) 25 tablet 3    triamcinolone  (KENALOG ) 0.1 % cream Apply to affected area twice daily for up to 2 weeks or until improved. 15 g 2    montelukast  (SINGULAIR ) 10 MG tablet TAKE ONE TABLET BY MOUTH DAILY. **MUST CALL MD FOR APPOINTMENT 30 tablet 5    oxyCODONE -acetaminophen  (PERCOCET ) 7.5-325 MG per tablet as needed.      rosuvastatin  (CRESTOR ) 20 MG tablet daily      Coenzyme Q10 (CO Q10) 200 MG CAPS Take by mouth daily (with breakfast)      calcium  citrate-vitamin D  (CITRICAL + D) 315-250 MG-UNIT TABS per tablet Take 2 tablets by mouth 2 times daily      Cholecalciferol  (VITAMIN D3) 50 MCG (2000 UT) CAPS Take 1 capsule by mouth daily 90 capsule 3    albuterol  sulfate HFA (PROVENTIL  HFA) 108 (90 Base) MCG/ACT inhaler Inhale 2 puffs into the lungs every 6 hours as needed for Wheezing 3 Inhaler 3    lidocaine  (LIDODERM ) 5 % Place 1 patch onto the skin daily 12 hours on, 12 hours off. 30 patch 0    aspirin  81 MG EC tablet Take 1 tablet by mouth daily 30 tablet 11    Multiple Vitamins-Minerals (MULTIVITAMIN PO) Take 1 tablet by mouth daily        No current facility-administered medications for this visit.     REVIEW OF SYSTEMS:    CONSTITUTIONAL: + major weight loss, - fatigue, weakness, night sweats or fever.  HEENT: No new vision difficulties or ringing in the ears.  RESPIRATORY: No new SOB, PND, orthopnea or cough.   CARDIOVASCULAR: See HPI  GI: No nausea, vomiting, diarrhea, constipation, abdominal pain or changes in bowel habits.  GU: No urinary frequency, urgency, incontinence hematuria or dysuria.  SKIN: No cyanosis or skin lesions.  MUSCULOSKELETAL: No new muscle or joint pain.  NEUROLOGICAL: No syncope or TIA-like symptoms.  PSYCHIATRIC: No  anxiety, pain, insomnia or depression    Objective:   PHYSICAL EXAM:        VITALS:  BP 118/60 (BP Site: Left Upper Arm, Patient Position: Sitting, BP Cuff Size: Large Adult)   Pulse 59   Ht 1.651 m (5' 5)   Wt (!) 142.6 kg (314 lb 4.8 oz)   SpO2 98%   BMI 52.30 kg/m   CONSTITUTIONAL: Cooperative, no apparent distress, and appears well nourished / obese  NEUROLOGIC:  Awake and orientated to person, place and time.  PSYCH: Calm affect.  SKIN: Warm and dry.  HEENT: Sclera non-icteric, normocephalic, neck supple, no elevation of JVP, normal carotid pulses with no bruits and thyroid normal size.  LUNGS:  No increased work of breathing and clear to auscultation, no crackles or wheezing  CARDIOVASCULAR:  Regular rate 60 and rhythm with no murmurs, gallops, rubs, or abnormal heart sounds, normal PMI.The apical impulses not displaced  JVP less than 8 cm H2O  Heart tones are crisp and normal  Cervical veins are not engorged  The carotid upstroke is normal in amplitude and contour without delay or bruit  JVP is not elevated  ABDOMEN:  Normal bowel sounds, non-distended and non-tender to palpation  EXT: no edema, no calf tenderness. Pulses are present bilaterally.    DATA:    Lab Results   Component Value Date    ALT 22 02/02/2024    AST 26 02/02/2024    ALKPHOS 87 02/02/2024    BILITOT 0.4 02/02/2024     Lab Results   Component Value Date    CREATININE 1.4 (H) 05/29/2024    BUN 22 (H) 05/29/2024    NA 142 05/29/2024    K 4.3 05/29/2024    CL 103 05/29/2024    CO2 27 05/29/2024     Lab Results   Component Value Date    TSH 1.37 03/31/2023     Lab Results   Component Value Date    WBC 8.4 05/29/2024    HGB 14.8 05/29/2024    HCT 43.6 05/29/2024    MCV 87.8 05/29/2024    PLT 187 05/29/2024     No components found for: CHLPL  Lab Results   Component Value Date    TRIG 180 (H) 04/01/2023    TRIG 116 02/13/2021    TRIG 134 08/09/2019     Lab Results   Component Value Date    HDL 54 04/01/2023    HDL 57 02/18/2022    HDL 48  (L) 02/13/2021     LABS:     04/04/24:  Component  Ref Range & Units 5 mo ago Comments   SODIUM  136 - 145 mmol/L 141    POTASSIUM  3.5 - 5.0 mmol/L 4.4    CHLORIDE  98 - 107 mmol/L 104    CO2  22 - 29 mmol/L 25    GLUCOSE, RANDOM  70 - 99 mg/dL 884 High     BLD UREA NITROGEN  8 - 23 mg/dL 21    CREATININE  9.48 - 1.30 mg/dL 8.48 High     CALCIUM   8.8 - 10.4 mg/dL 9.9    TOTAL PROTEIN  6.4 - 8.3 gm/dL 6.9    ALBUMIN  3.2 - 4.6 gm/dL 4.3    TOTAL BILIRUBIN  0.2 - 1.3 mg/dL 0.5    ALK PHOSPHATASE  36 - 123 U/L 96    AST  <=40 U/L 28    ALT  <=41 U/L 21  ESTIMATED GFR  >=60 mL/min/1.73 m2 38 Low     ANION GAP  7 - 16 mmol/L 12      Component  Ref Range & Units 5 mo ago    CHOLESTEROL  <200 mg/dL 850    TRIGLYCERIDE  <849 mg/dL 898    HDL CHOLESTEROL  >=40 mg/dL 62    LDL CHOLESTEROL (CALCULATED)  <100 mg/dL 69    TOTAL NON HDL CHOL  <=129 mg/dL 87        Radiology Review:  Pertinent images / reports were reviewed as a part of this visit and reveals the following:    Echo: 04/01/23  Summary   Technically difficult study due to body habitus. Definity  was administered.   Left ventricular cavity size is normal with normal left ventricular wall   thickness. Ejection fraction is visually estimated to be 55-60%. Grade I   diastolic dysfunction with normal LV filling pressures. E/e' = 11.1.   Non-coronary cusp appears restricted.   Mild aortic stenosis with a peak velocity of 2.85m/s and a mean pressure   gradient of . AVA (VTI): 2.18 cm.2.   RVSP is estimated to be 21-24 mmHG.   IVC not well visualized.   Right atrium is not well visualized, but appears normal in size.   Right ventricle is not well visualized, but appears dilated.   There appears to be some calcification of the posterior mitral valve leaflet   without evidence of stenosis.    Angiogram: 04/05/23  ANGIOGRAM/CORONARY ARTERIOGRAM:     Left main artery Bifurcates into the left anterior descending artery and left circumflex artery nml   Left anterior descending  artery Gives rise to 2 diagonal arteries Mid stent patent   Diagonals   nml   Left circumflex artery Non-dominant vessel that gives rise to 2 obtuse marginal arteries nml   Obtuse Marginals   nml      Right coronary artery Dominant vessel that gives rise to the posterior descending artery and posterolateral branch nml   Posterior descending artery Posterior lateral branch   nml  nml      Left ventricular angiogram was done in the 30 RAO projection and revealed  LVEF- 60%  LVG- nml  LVEDP- 19  Aortic pressure- 113/68 There was no gradient between the left ventricle and aorta  IMPRESSION/SUMMARY  ~Coronary Angiography w/ patent stent  ~LVG with LVEF of 60% and no regional wall motion abnormalities    Echo: Sept '24:      Left Ventricle: Normal left ventricular systolic function with a visually estimated EF of 60 - 65%. EF by 2D Simpsons Biplane is 64%. Left ventricle size is normal. Findings consistent with moderate concentric hypertrophy. Normal wall motion. Grade I diastolic dysfunction with normal LAP.    Aortic Valve: Mildly calcified cusps. Mild stenosis of the aortic valve. AV mean gradient is 14 mmHg. AV area by continuity VTI is 1.6 cm2.    Mitral Valve: Mild annular calcification at the posterior leaflet.    Image quality is technically difficult. Contrast used: Definity . Technically difficult study with poor endocardial visualization.    Assessment:      Diagnosis Orders   1. Coronary artery disease due to lipid rich plaque   ~improved without CP ; continues to c/o LUE numbness and SOB. Mixed symptoms, possibly angina component   ~isosorbide  60 mg daily : causing HAs yet lessening with time  ~RF : activities limited d/t neuropathy / obesity / diabetes   ~s/p cardiac  cath : patent stent LAD April '24  ~diag-1 ostial 50% jailed on cath '20  ~ASA / nitrate / Ranexa  / dilt        2. Primary hypertension   ~controlled   ~grade I DD on echo Sept '24  ~hx of treated OSA  ~mod [facility]       3. Dyslipidemia    ~controlled  ~April '25 : TC 149 trig 101 HDL 62 LDL 69  ~crestor  20 mg daily        4. Chronic diastolic congestive heart failure (HCC)   ~stable / compensated  ~grade I DD ; treated OSA  ~Jardiance  10 mg daily / spironolactone  25 mg daily / demadex  20 mg bid         I had the opportunity to review the clinical symptoms and presentation of Rachael Mays.   Plan:     LexiScan  myoview  : mixed symptoms of LUE discomfort / SOB  F/U in 4 months / test dependent    Overall the patient is stable from CV standpoint    I have addresed the patient's cardiac risk factors and adjusted pharmacologic treatment as needed. In addition, I have reinforced the need for patient directed risk factor modification.    Further evaluation will be based upon the patient's clinical course and testing results.    All questions and concerns were addressed to the patient. Alternatives to my treatment were discussed.      The patient is not currently smoking.     Patient is on a beta-blocker   Patient is not on an ace-i/ARB : CKD followed by Dr. Crayton  Patient is on a statin    Antiplatelet therapy has been recommended / prescribed for this patient. Education conducted on adverse reactions including bleeding was discussed.    Daily weight, low sodium diet were discussed. Patient instructed to call the office with a weight gain: > 3 # over night or 5# in one week; swelling, SOB/orthopnea/PND    The patient verbalizes understanding not to stop medications without discussing with us .    Discussed exercise: 30-60 minutes 7 days/week : limited d/t neuropathy  Discussed Low saturated fat/NAS diet. A1c 5.6%     Thank you for allowing to us  to participate in the care of Rachael Mays.    Emmie Comfort, APRN    Documentation of today's visit sent to PCP  "

## 2024-10-13 ENCOUNTER — Inpatient Hospital Stay: Admit: 2024-10-13 | Payer: MEDICARE | Primary: Sports Medicine

## 2024-10-13 DIAGNOSIS — I209 Angina pectoris, unspecified: Secondary | ICD-10-CM

## 2024-10-13 LAB — NM STRESS TEST WITH MYOCARDIAL PERFUSION
Baseline Diastolic BP: 59 mmHg
Baseline HR: 56 {beats}/min
Baseline O2 Sat: 96 %
Baseline ST Depression: 0 mm
Baseline Systolic BP: 112 mmHg
Exercise Duration Seconds: 0 s
Exercise Duration Time: 4 min
LV EDV BP: 110 mL — AB (ref 56–104)
LV ESV BP: 42 mL (ref 19–49)
LV Mass: 133 g
Nuc Stress EF: 62 %
Stress Diastolic BP: 46 mmHg
Stress Estimated Workload: 1 METS
Stress O2 Sat: 95 %
Stress Peak HR: 58 {beats}/min
Stress Percent HR Achieved: 38 %
Stress Rate Pressure Product: 5800 BPM*mmHg
Stress Systolic BP: 100 mmHg
Stress Target HR: 154 {beats}/min
TID: 1.41

## 2024-10-13 MED ORDER — REGADENOSON 0.4 MG/5ML IV SOLN
0.4 | Freq: Once | INTRAVENOUS | Status: AC | PRN
Start: 2024-10-13 — End: 2024-10-13
  Administered 2024-10-13: 19:00:00 0.4 mg via INTRAVENOUS

## 2024-10-13 MED ORDER — REGADENOSON 0.4 MG/5ML IV SOLN
0.4 | INTRAVENOUS | Status: AC
Start: 2024-10-13 — End: 2024-10-13

## 2024-10-13 MED ORDER — AMINOPHYLLINE 25 MG/ML IV SOLN
25 | INTRAVENOUS | Status: AC
Start: 2024-10-13 — End: 2024-10-13

## 2024-10-13 MED ORDER — AMINOPHYLLINE 25 MG/ML IV SOLN
25 | Freq: Once | INTRAVENOUS | Status: AC
Start: 2024-10-13 — End: 2024-10-13
  Administered 2024-10-13: 19:00:00 100 mg via INTRAVENOUS

## 2024-10-13 MED ORDER — TECHNETIUM TC 99M TETROFOSMIN IV KIT
Freq: Once | INTRAVENOUS | Status: AC | PRN
Start: 2024-10-13 — End: 2024-10-13
  Administered 2024-10-13: 18:00:00 10.7 via INTRAVENOUS

## 2024-10-13 MED ORDER — TECHNETIUM TC 99M TETROFOSMIN IV KIT
Freq: Once | INTRAVENOUS | Status: AC
Start: 2024-10-13 — End: 2024-10-13
  Administered 2024-10-13: 19:00:00 35.1 via INTRAVENOUS

## 2024-10-13 MED ORDER — NORMAL SALINE FLUSH 0.9 % IV SOLN
0.9 | INTRAVENOUS | Status: AC
Start: 2024-10-13 — End: 2024-10-13

## 2024-10-13 MED FILL — AMINOPHYLLINE 25 MG/ML IV SOLN: 25 mg/mL | INTRAVENOUS | Qty: 20 | Fill #0

## 2024-10-13 MED FILL — LEXISCAN 0.4 MG/5ML IV SOLN: 0.4 MG/5ML | INTRAVENOUS | Qty: 5 | Fill #0

## 2024-10-13 MED FILL — SODIUM CHLORIDE FLUSH 0.9 % IV SOLN: 0.9 % | INTRAVENOUS | Qty: 50 | Fill #0

## 2024-10-24 NOTE — Telephone Encounter (Signed)
"  Formatting of this note might be different from the original.  Last OV or last med check/physical: 07/05/24  Last refill, quantity, number refill, class:   Singulair  10mg , 10/20/24, #90, 1 refill  Pending OV: 12/12/24 & 04/16/25  Pharmacy Verified: [facility]  Last OARRS (if indicated):     Electronically signed by Ascencion Rosina SAUNDERS, LPN at 88/81/7974  9:21 AM EST  "

## 2024-10-24 NOTE — Telephone Encounter (Signed)
"  Formatting of this note might be different from the original.  Duplicate request.   Electronically signed by Sebastian Vicenta Sharper, DO at 10/24/2024  3:47 PM EST  "

## 2024-11-15 ENCOUNTER — Ambulatory Visit: Admit: 2024-11-15 | Discharge: 2024-11-15 | Payer: MEDICARE | Attending: Dermatology | Primary: Sports Medicine

## 2024-11-15 DIAGNOSIS — D229 Melanocytic nevi, unspecified: Principal | ICD-10-CM

## 2024-11-15 MED ORDER — MOMETASONE FUROATE 0.1 % EX CREA
0.1 | CUTANEOUS | 2 refills | Status: AC
Start: 2024-11-15 — End: ?

## 2024-11-15 NOTE — Addendum Note (Signed)
"  Addended by: GLADIS DONNICE RUSH on: 11/15/2024 04:53 PM     Modules accepted: Level of Service    "

## 2024-11-15 NOTE — Addendum Note (Signed)
"  Addended by: GLADIS DONNICE RUSH on: 11/15/2024 03:39 PM     Modules accepted: Orders    "

## 2024-11-15 NOTE — Progress Notes (Addendum)
 "    [facility]  Rachael Craft, MD  [phone]      Rachael Mays  [birth year]    68 y.o. female     Date of Visit: 11/15/2024    Chief Complaint: skin lesions of concern    History of Present Illness:    1.  She presents today for evaluation of multiple moles - not aware of any changes in size, color or shape.       2.  She reports asymptomatic growths on the neck and inframammary region.     3.  She has a persistent growth the chest.     4.  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  Left ear involvement persists and has not responded as well to triamcinolone  0.1% cream.    5.  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 Gasper on 12/29/2017.       Review of Systems:  Gen: Feels well, good sense of health.    Past Medical History, Family History, Surgical History, Medications and Allergies reviewed.    Past Medical History:   Diagnosis Date    AKI (acute kidney injury) 01/26/2022    Anxiety     Asthma     Blood circulation, collateral     CAD (coronary artery disease)     CHF (congestive heart failure) (HCC)     Chronic back pain     Deep vein thrombosis     Degeneration of thoracic intervertebral disc 10/15/2017    Depression     GERD (gastroesophageal reflux disease)     NO LONGER SINCE LAP    GERD (gastroesophageal reflux disease) 01/23/2009    Hx of blood clots     Hyperlipidemia     hx; resolved with lap band    Hypertension     Idiopathic peripheral neuropathy 03/26/2017    Melanoma (HCC)     upper back    MRSA (methicillin resistant staph aureus) culture positive 10/17/2019    leg abscess    Obesity     hx of; had lap band    Obstructive sleep apnea 09/18/2015    uses bi-pap    Obstructive sleep apnea (adult) (pediatric) 09/18/2015    Pulmonary embolism (HCC)     Stenosis of right carotid artery 06/16/2021    Type 2 diabetes mellitus with  diabetic polyneuropathy, without long-term current use of insulin  (HCC) 01/26/2022     Past Surgical History:   Procedure Laterality Date    BACK SURGERY      neck  plates    BREAST SURGERY      left lumpectomy    CARDIAC CATHETERIZATION  08/09/2019    CERVICAL FUSION      CESAREAN SECTION      CHOLECYSTECTOMY      COLONOSCOPY      COLONOSCOPY  04/08/2010    COLONOSCOPY N/A 04/18/2021    COLONOSCOPY DIAGNOSTIC performed by Garnette Rachael Lunger, MD at [facility] ENDOSCOPY    CORONARY ANGIOPLASTY WITH STENT PLACEMENT  03/31/2016    mid LAD 3.25x18 alpine    DIAGNOSTIC CARDIAC CATH LAB PROCEDURE  08/09/2019    normal    DILATATION, ESOPHAGUS      ENDOSCOPY, COLON, DIAGNOSTIC      HYSTERECTOMY (CERVIX STATUS UNKNOWN)      HYSTERECTOMY, VAGINAL  LAP BAND  05/01/2008    Dr. Brady    OTHER SURGICAL HISTORY      Greenfield Filter: curently in place    OTHER SURGICAL HISTORY  07/05/2013    lap band removal    SHOULDER SURGERY      SKIN CANCER EXCISION  12/29/2017    UPPER GASTROINTESTINAL ENDOSCOPY  05/19/2013    UPPER GASTROINTESTINAL ENDOSCOPY  07/21/2013    ESOPHAGEAL STENT PLACEMENT    UPPER GASTROINTESTINAL ENDOSCOPY N/A 07/31/2014    Esophagogastroduodenoscopy with esophageal balloon dilation    UPPER GASTROINTESTINAL ENDOSCOPY N/A 04/18/2021    EGD DILATION BALLOON performed by Garnette Rachael Lunger, MD at [facility] ENDOSCOPY    VASCULAR SURGERY  04/2015    Rachael Mays, celiac artery angiogram, normal abd arteries       Allergies   Allergen Reactions    Environmental/Seasonal     Ibuprofen       Due to lap band erosion    Lisinopril       Memory loss     Talwin [Pentazocine] Other (See Comments)     dizzy     Outpatient Medications Marked as Taking for the 11/15/24 encounter (Office Visit) with Rachael Rachael PARAS, MD   Medication Sig Dispense Refill    OZEMPIC, 0.25 OR 0.5 MG/DOSE, 2 MG/3ML SOPN Inject into the skin      torsemide  (DEMADEX ) 20 MG tablet TAKE 1 TABLET BY MOUTH EVERY MORNING, AT NOON, AND AT BEDTIME (Patient taking  differently: Take 1 tablet by mouth daily) 90 tablet 0    isosorbide  mononitrate (IMDUR ) 60 MG extended release tablet Take 1 tablet by mouth daily 90 tablet 2    JARDIANCE  10 MG tablet TAKE 1 TABLET BY MOUTH DAILY 90 tablet 1    dilTIAZem  (CARDIZEM  CD) 240 MG extended release capsule TAKE 1 CAPSULE BY MOUTH DAILY 90 capsule 1    metoprolol  succinate (TOPROL  XL) 25 MG extended release tablet Take 1 tablet by mouth daily 30 tablet 3    spironolactone  (ALDACTONE ) 25 MG tablet Take 1 tablet by mouth daily 90 tablet 1    ranolazine  (RANEXA ) 500 MG extended release tablet Take 1 tablet by mouth 2 times daily 180 tablet 3    betamethasone , augmented, (DIPROLENE ) 0.05 % lotion Apply topically to the scalp daily as needed for itching/rash. 60 mL 2    Tirzepatide (MOUNJARO SC) Inject 5 mg into the skin every 7 days      Blood Pressure KIT 1 Device by Does not apply route 2 times daily at 0800 and 1400 1 kit 0    Blood Pressure Monitoring (BLOOD PRESSURE CUFF) MISC 1 Device by Does not apply route daily 1 each 0    Blood Pressure Monitoring (BLOOD PRESSURE CUFF) MISC 1 Device by Does not apply route in the morning and at bedtime 1 each 0    gabapentin  (NEURONTIN ) 300 MG capsule Take 1 capsule by mouth nightly.      Magnesium  400 MG CAPS Take by mouth daily      nitroGLYCERIN  (NITROSTAT ) 0.4 MG SL tablet up to max of 3 total doses. If no relief after 1 dose, call 911. 25 tablet 3    triamcinolone  (KENALOG ) 0.1 % cream Apply to affected area twice daily for up to 2 weeks or until improved. 15 g 2    montelukast  (SINGULAIR ) 10 MG tablet TAKE ONE TABLET BY MOUTH DAILY. **MUST CALL MD FOR APPOINTMENT 30 tablet 5    oxyCODONE -acetaminophen  (PERCOCET ) 7.5-325 MG per tablet  as needed.      rosuvastatin  (CRESTOR ) 20 MG tablet daily      Coenzyme Q10 (CO Q10) 200 MG CAPS Take by mouth daily (with breakfast)      calcium  citrate-vitamin D  (CITRICAL + D) 315-250 MG-UNIT TABS per tablet Take 2 tablets by mouth 2 times daily       Cholecalciferol  (VITAMIN D3) 50 MCG (2000 UT) CAPS Take 1 capsule by mouth daily 90 capsule 3    albuterol  sulfate HFA (PROVENTIL  HFA) 108 (90 Base) MCG/ACT inhaler Inhale 2 puffs into the lungs every 6 hours as needed for Wheezing 3 Inhaler 3    lidocaine  (LIDODERM ) 5 % Place 1 patch onto the skin daily 12 hours on, 12 hours off. 30 patch 0    aspirin  81 MG EC tablet Take 1 tablet by mouth daily 30 tablet 11    Multiple Vitamins-Minerals (MULTIVITAMIN PO) Take 1 tablet by mouth daily           Physical Examination       The following were examined : Psych/Neuro, Scalp/hair, Head/face, Conjunctivae/eyelids, Gums/teeth/lips, Neck, Breast/axilla/chest, Abdomen, Back, RUE, LUE, RLE, LLE, and Nails/digits.    Well appearing.    1.  Trunk and extremities with multiple well defined round to oval smooth brown macules and papules.    2.  Neck and inframammary region with multiple small pedunculated skin colored papules.     3.  Left upper chest with a stuck on appearing verrucous whitish papule.     4.  Left concha with mild erythema and scaling.     5.  Mid upper back with a linear surgical scar.         Assessment and Plan     1. Multiple nevi - benign appearing    Sun protective behaviors, including use of at least SPF 30 sunscreen, and self skin examinations were encouraged.  Call for any new or concerning lesions.       2. Cutaneous skin tags     Reassurance.      3. SK (seborrheic keratosis)     Reassurance.      4. Seborrheic dermatitis - mild persistent involvement of the left ear, chronic, not at treatment goal    Switch to mometasone  cream twice daily until improved and then as needed for recurrences.     5. History of stage IA melanoma of the back - no signs of recurrence.     Sun protective behaviors, including use of at least SPF 30 sunscreen, and self skin examinations were encouraged.  Call for any new or concerning lesions.           Return in about 6 months (around 05/16/2025).    --Rachael JINNY Craft, MD        "

## 2024-11-27 NOTE — Telephone Encounter (Signed)
"  Last OV: 10/11/2024 NPTS   Next OV: Recall List   Labs:05/30/2024 BMP   "

## 2024-11-29 MED ORDER — METOPROLOL SUCCINATE ER 25 MG PO TB24
25 | ORAL_TABLET | Freq: Every day | ORAL | 3 refills | 30.00000 days | Status: AC
Start: 2024-11-29 — End: ?

## 2024-12-05 ENCOUNTER — Encounter: Payer: MEDICARE | Attending: Internal Medicine | Primary: Sports Medicine

## 2024-12-05 NOTE — Telephone Encounter (Signed)
"  Duplicate - Signed 11/29/24    Called pt and informed her to call pharmacy.  "

## 2024-12-19 ENCOUNTER — Ambulatory Visit: Admit: 2024-12-19 | Discharge: 2024-12-19 | Payer: MEDICARE | Attending: Internal Medicine | Primary: Sports Medicine

## 2024-12-19 VITALS — BP 136/71 | HR 58 | Wt 318.2 lb

## 2024-12-19 DIAGNOSIS — I10 Essential (primary) hypertension: Principal | ICD-10-CM

## 2024-12-19 NOTE — Progress Notes (Signed)
 Office : [phone]     Fax :[phone]      Nephrology progress Note        History of Present Illness:    This is a 69 y.o. female who presents to the office for evaluation of  worsening renal function .  H/o DM 2 - none   H/o HTN - now BP better after loss of weight   H/o Renal stones - none   H/o renal disease in family   H/o CAD - h/o Stent in past. H/o CHF   CHF?     H/o smoking .    Today visit 12/19/24  Pt denies CP/SOB/palpitations/abdominal pain/N/V.   Pt denies fever/ chills  Pt denies dysuria or hematuria     Swelling improved once Torsemide  increased to 20 mg bid   Cr 1.6 1/26<-- 1.4 6/25<-- 1.4<-- 1.55 10/24<-- 1.6 10/24<- 1.4 8/22  <--1.2 7/24: base line   Lytes good  GFR 50 --> 40 --> 36 --> 42--> 42--> 34   Echo 9/24: G1DD   Ca wnl 10.2  CBC wnl  UACR UPCR wnl 1/26     Meds: jardaince, Dilt , Indur, Aldactone , Torsmide , Ozempic   Previous labs:   ANA FLC C3 C4 all wnl   UACR neg   UM blnad   BP - home - she does n ot check check routinly     Pt is diagnsoed with HFpEF and mild pulm HTN   N0 NSAIDS     Heart cath neg   BP at home 130/70  Meds include Torsemide , Dilt, Imdur  , Jardiance    BP at home not controlled recently       Hemoglobin A1C   Date Value Ref Range Status   03/31/2023 5.5 See comment % Final     Comment:     Comment:  Diagnosis of Diabetes: > or = 6.5%  Increased risk of diabetes (Prediabetes): 5.7-6.4%  Glycemic Control: Nonpregnant Adults: <7.0%                    Pregnant: <6.0%           Recently stopped lisinopril  .   Pt is on spironolactone   , Dilt, Imdur , Torsemide      Previous labs:   Cr 1.3- 1.5 9/23  <-- 1.6 7/23<--1.4 4/23<--1. 3/23<--1.3<--2.0 (2/23) <--1.8 1/23<--1.4 8/22<--1.1 3/22<-- normal 2021 <--base line 1.0-1.1     GFR 44  Lytes ok   UPCR 0.07  UACR - neg 7/23  UA- no RBC WBC   HCV neg   CT ABD 5/23: KIDNEYS/URETERS:  Right renal cyst.  No hydronephrosis, stone, or suspicious mass . ADRENAL LESION  Mri 9/23: 1.3 cm right  adrenal nodule demonstrates signal characteristics consistent with an adenoma.   S/p CTPA 2/22  Echo 7/23:    systolic function with an estimated ejection fraction of 55-60%.   No regional wall motion abnormalities are seen.   Normal diastolic function.E/e=11.35.    No usage of NSAIDS     Past Medical History:        Diagnosis Date    AKI (acute kidney injury) 01/26/2022    Anxiety     Asthma     Blood circulation, collateral     CAD (coronary artery disease)     CHF (congestive heart failure) (HCC)     Chronic back pain     Deep vein thrombosis     Degeneration of thoracic intervertebral disc 10/15/2017    Depression  GERD (gastroesophageal reflux disease)     NO LONGER SINCE LAP    GERD (gastroesophageal reflux disease) 01/23/2009    Hx of blood clots     Hyperlipidemia     hx; resolved with lap band    Hypertension     Idiopathic peripheral neuropathy 03/26/2017    Melanoma (HCC)     upper back    MRSA (methicillin resistant staph aureus) culture positive 10/17/2019    leg abscess    Obesity     hx of; had lap band    Obstructive sleep apnea 09/18/2015    uses bi-pap    Obstructive sleep apnea (adult) (pediatric) 09/18/2015    Pulmonary embolism (HCC)     Stenosis of right carotid artery 06/16/2021    Type 2 diabetes mellitus with diabetic polyneuropathy, without long-term current use of insulin  (HCC) 01/26/2022       Past Surgical History:        Procedure Laterality Date    BACK SURGERY      neck  plates    BREAST SURGERY      left lumpectomy    CARDIAC CATHETERIZATION  08/09/2019    CERVICAL FUSION      CESAREAN SECTION      CHOLECYSTECTOMY      COLONOSCOPY      COLONOSCOPY  04/08/2010    COLONOSCOPY N/A 04/18/2021    COLONOSCOPY DIAGNOSTIC performed by Garnette SHAUNNA Lunger, MD at [facility]    CORONARY ANGIOPLASTY WITH STENT PLACEMENT  03/31/2016    mid LAD 3.25x18 alpine    DIAGNOSTIC CARDIAC CATH LAB PROCEDURE  08/09/2019    normal    DILATATION, ESOPHAGUS      ENDOSCOPY, COLON, DIAGNOSTIC       HYSTERECTOMY (CERVIX STATUS UNKNOWN)      HYSTERECTOMY, VAGINAL      LAP BAND  05/01/2008    Dr. Brady    OTHER SURGICAL HISTORY      [place] Filter: curently in place    OTHER SURGICAL HISTORY  07/05/2013    lap band removal    SHOULDER SURGERY      SKIN CANCER EXCISION  12/29/2017    UPPER GASTROINTESTINAL ENDOSCOPY  05/19/2013    UPPER GASTROINTESTINAL ENDOSCOPY  07/21/2013    ESOPHAGEAL STENT PLACEMENT    UPPER GASTROINTESTINAL ENDOSCOPY N/A 07/31/2014    Esophagogastroduodenoscopy with esophageal balloon dilation    UPPER GASTROINTESTINAL ENDOSCOPY N/A 04/18/2021    EGD DILATION BALLOON performed by Garnette SHAUNNA Lunger, MD at [facility] END[facility]    VASCULAR SURGERY  04/2015    V[place]ca, celiac artery angiogram, normal abd arteries       Current Medications:    Current Outpatient Medications   Medication Sig Dispense Refill    metoprolol  succinate (TOPROL  XL) 25 MG extended release tablet TAKE 1 TABLET BY MOUTH DAILY 90 tablet 3    mometasone  (ELOCON ) 0.1 % cream Apply to affected area twice daily for up to 2 weeks or until improved. 15 g 2    torsemide  (DEMADEX ) 20 MG tablet TAKE 1 TABLET BY MOUTH EVERY MORNING, AT NOON, AND AT BEDTIME (Patient taking differently: Take 1 tablet by mouth daily TAKE 1 TABLET BY MOUTH EVERY MORNING, AT NOON, AND AT BEDTIME) 90 tablet 0    isosorbide  mononitrate (IMDUR ) 60 MG extended release tablet Take 1 tablet by mouth daily 90 tablet 2    JARDIANCE  10 MG tablet TAKE 1 TABLET BY MOUTH DAILY 90 tablet 1  dilTIAZem  (CARDIZEM  CD) 240 MG extended release capsule TAKE 1 CAPSULE BY MOUTH DAILY 90 capsule 1    spironolactone  (ALDACTONE ) 25 MG tablet Take 1 tablet by mouth daily 90 tablet 1    ranolazine  (RANEXA ) 500 MG extended release tablet Take 1 tablet by mouth 2 times daily 180 tablet 3    betamethasone , augmented, (DIPROLENE ) 0.05 % lotion Apply topically to the scalp daily as needed for itching/rash. 60 mL 2    Blood Pressure KIT 1 Device by Does not apply route 2 times daily at  0800 and 1400 1 kit 0    Blood Pressure Monitoring (BLOOD PRESSURE CUFF) MISC 1 Device by Does not apply route daily 1 each 0    Blood Pressure Monitoring (BLOOD PRESSURE CUFF) MISC 1 Device by Does not apply route in the morning and at bedtime 1 each 0    gabapentin  (NEURONTIN ) 300 MG capsule Take 1 capsule by mouth nightly.      Magnesium  400 MG CAPS Take by mouth daily      nitroGLYCERIN  (NITROSTAT ) 0.4 MG SL tablet up to max of 3 total doses. If no relief after 1 dose, call 911. 25 tablet 3    triamcinolone  (KENALOG ) 0.1 % cream Apply to affected area twice daily for up to 2 weeks or until improved. 15 g 2    montelukast  (SINGULAIR ) 10 MG tablet TAKE ONE TABLET BY MOUTH DAILY. **MUST CALL MD FOR APPOINTMENT 30 tablet 5    oxyCODONE -acetaminophen  (PERCOCET ) 7.5-325 MG per tablet as needed.      rosuvastatin  (CRESTOR ) 20 MG tablet daily      Coenzyme Q10 (CO Q10) 200 MG CAPS Take by mouth daily (with breakfast)      calcium  citrate-vitamin D  (CITRICAL + D) 315-250 MG-UNIT TABS per tablet Take 2 tablets by mouth 2 times daily      Cholecalciferol  (VITAMIN D3) 50 MCG (2000 UT) CAPS Take 1 capsule by mouth daily 90 capsule 3    albuterol  sulfate HFA (PROVENTIL  HFA) 108 (90 Base) MCG/ACT inhaler Inhale 2 puffs into the lungs every 6 hours as needed for Wheezing 3 Inhaler 3    lidocaine  (LIDODERM ) 5 % Place 1 patch onto the skin daily 12 hours on, 12 hours off. 30 patch 0    aspirin  81 MG EC tablet Take 1 tablet by mouth daily 30 tablet 11    Multiple Vitamins-Minerals (MULTIVITAMIN PO) Take 1 tablet by mouth daily      Tirzepatide (MOUNJARO SC) Inject 5 mg into the skin every 7 days (Patient not taking: Reported on 12/19/2024)       No current facility-administered medications for this visit.       Allergies:  Environmental/seasonal, Ibuprofen , Lisinopril , and Talwin [pentazocine]    Social History:   Social History     Socioeconomic History    Marital status: Divorced     Spouse name: [name]    Number of children: 2     Years of education: 12    Highest education level: Not on file   Occupational History    Occupation: Retired   Tobacco Use    Smoking status: Former     Current packs/day: 0.00     Average packs/day: 0.5 packs/day for 40.0 years (20.0 ttl pk-yrs)     Types: Cigarettes     Start date: 08/07/1972     Quit date: 08/07/2012     Years since quitting: 12.3    Smokeless tobacco: Never   Vaping Use  Vaping status: Never Used   Substance and Sexual Activity    Alcohol use: No    Drug use: No    Sexual activity: Not Currently     Partners: Male   Other Topics Concern    Not on file   Social History Narrative    Works as conservation officer, nature at [employer]: Low Risk  (05/15/2020)    Overall Financial Resource Strain (CARDIA)     Difficulty of Paying Living Expenses: Not hard at all   Food Insecurity: No Food Insecurity (03/31/2023)    Hunger Vital Sign     Worried About Running Out of Food in the Last Year: Never true     Ran Out of Food in the Last Year: Never true   Transportation Needs: No Transportation Needs (03/31/2023)    PRAPARE - Therapist, Art (Medical): No     Lack of Transportation (Non-Medical): No   Physical Activity: Not on file   Stress: Not on file   Social Connections: Not on file   Intimate Partner Violence: Unknown (12/25/2022)    Received from [facility] and [employer]     Feel physically or emotionally unsafe where currently live: Not on file     Harm by anyone: Not on file     Emotionally Harmed: Not on file   Housing Stability: Low Risk  (03/31/2023)    Housing Stability Vital Sign     Unable to Pay for Housing in the Last Year: No     Number of Places Lived in the Last Year: 1     Unstable Housing in the Last Year: No       Family History:   Family History   Problem Relation Age of Onset    Arthritis Mother     Cancer Mother     Depression Mother     High Blood Pressure Mother     Heart Disease  Mother 12        MI     Ovarian Cancer Mother 41    Heart Attack Mother     Cancer Father 8        colon    Other Sister         OSA    Cancer Sister         lung liver    Other Brother         OSA    Sleep Apnea Brother     Other Brother         OSA    Sleep Apnea Brother     Heart Disease Maternal Grandmother     Heart Attack Maternal Grandmother     Early Death Paternal Grandmother     Heart Disease Paternal Grandfather     Heart Attack Paternal Grandfather     Other Daughter         OSA    Diabetes Other     High Blood Pressure Other     Obesity Other        Review of Systems:    Constitutional: No fever, no chills, no lethargy, no weakness.  HEENT:  No headache,  sore throat.  Cardiac:  No chest pain, ++, orthopnea or PND.  Chest:              No cough,  phlegm or wheezing.  Abdomen:  No abdominal pain, nausea or vomiting.  Neuro:                No focal weakness, abnormal movements orseizure like activity.  Skin:   No rashes, no itching.  GU:   No hematuria,, no dysuria, no flank pain.  Extremities:  +2 swelling       Objective:  BP 136/71   Pulse 58   Wt (!) 144.3 kg (318 lb 3.2 oz)   BMI 52.95 kg/m     Physical Exam:  General appearance:Awake, alert, in no acute distress  Skin: warm and dry, no rash or erythema    Eyes: conjunctivae normal and sclera anicteric    ENT: :no swelling     Neck: without any JVD. No carotid bruits or thyromegaly.      Pulmonary: b/l rales     Cardiovascular:Normal S1 & S2, No S3 or  S4, No  Pericardial rub , No Murmur     Abdomen: soft nontender, bowel sounds present, no organomegaly,  No ascites      Extremities: no cyanosis,no edema    Labs:    CBC:   Lab Results   Component Value Date/Time    WBC 8.4 05/29/2024 03:33 PM    RBC 4.97 05/29/2024 03:33 PM    HGB 14.8 05/29/2024 03:33 PM    HCT 43.6 05/29/2024 03:33 PM    MCV 87.8 05/29/2024 03:33 PM    RDW 15.1 05/29/2024 03:33 PM    PLT 187 05/29/2024 03:33 PM    MPV 9.8 05/29/2024 03:33 PM      BMP:   Lab Results   Component  Value Date/Time    NA 142 05/29/2024 03:33 PM    NA 141 02/02/2024 12:56 PM    NA 139 10/04/2023 01:15 PM    K 4.3 05/29/2024 03:33 PM    K 4.7 02/02/2024 12:56 PM    K 4.5 10/04/2023 01:15 PM    K 4.6 10/17/2019 06:07 AM    K 3.9 05/05/2017 03:21 AM    CL 103 05/29/2024 03:33 PM    CL 104 02/02/2024 12:56 PM    CL 99 10/04/2023 01:15 PM    CO2 27 05/29/2024 03:33 PM    CO2 28 02/02/2024 12:56 PM    CO2 26 10/04/2023 01:15 PM    BUN 22 05/29/2024 03:33 PM    BUN 15 02/02/2024 12:56 PM    BUN 21 10/04/2023 01:15 PM    CREATININE 1.4 05/29/2024 03:33 PM    CREATININE 1.4 02/02/2024 12:56 PM    CREATININE 1.4 10/04/2023 01:15 PM    GLUCOSE 135 05/29/2024 03:33 PM    GLUCOSE 103 02/02/2024 12:56 PM    GLUCOSE 127 10/04/2023 01:15 PM    CALCIUM  10.5 05/29/2024 03:33 PM    CALCIUM  9.9 02/02/2024 12:56 PM    CALCIUM  10.3 10/04/2023 01:15 PM      BNP:No results found for: BNP  PHOSPHORUS:    Lab Results   Component Value Date/Time    PHOS 3.9 05/29/2024 03:33 PM    PHOS 3.8 08/21/2013 03:46 PM    PHOS 3.3 08/18/2013 08:13 AM     MAGNESIUM :   Lab Results   Component Value Date/Time    MG 1.90 04/05/2023 04:41 AM     ALBUMIN:   No results found for: LABALBU    IRON:    Lab Results   Component Value Date/Time    IRON 93 03/26/2010 05:35 PM  IRON SATURATION:    No components found for: LABIRON    TIBC:    Lab Results   Component Value Date/Time    TIBC 397 03/26/2010 05:35 PM     FERRITIN:    Lab Results   Component Value Date/Time    FERRITIN 61.9 05/02/2009 05:40 PM     ANA:   Lab Results   Component Value Date    ANA Negative 10/04/2023       SPEP:   Lab Results   Component Value Date/Time    GAMGLOB 1.5 10/04/2023 01:15 PM     UPEP: No results found for: TPU   HEPBSAG:No results found for: HEPBSAG  HEPCAB:No results found for: HEPCAB  C3:   Lab Results   Component Value Date    C3 175.0 10/04/2023     C4:   Lab Results   Component Value Date    C4 22.0 10/04/2023     MPO ANCA: No results found for: MPO  .  PR3 ANCA:  No results found for: PR3  PTH: No results found for: PTH    Urine Creatinine:    No results found for: LABCREA    Urine Eosinophils: No results found for: UREO  Urine Protein:  No results found for: TPU  Urinalysis:  U/A:   Lab Results   Component Value Date/Time    NITRU Negative 10/04/2023 01:16 PM    COLORU Yellow 10/04/2023 01:16 PM    PHUR 6.0 10/04/2023 01:16 PM    PHUR 7.0 03/31/2023 04:01 PM    LABCAST 0-1 Hyaline 07/09/2013 02:09 AM    WBCUA 1 10/04/2023 01:16 PM    RBCUA 1 10/04/2023 01:16 PM    RBCUA NEGATIVE 02/13/2021 03:03 PM    MUCUS PRESENT 02/13/2021 03:03 PM    YEAST 0 11/25/2017 04:24 PM    BACTERIA None Seen 10/04/2023 01:16 PM    CLARITYU Clear 10/04/2023 01:16 PM    LEUKOCYTESUR Negative 10/04/2023 01:16 PM    UROBILINOGEN 0.2 10/04/2023 01:16 PM    BILIRUBINUR Negative 10/04/2023 01:16 PM    BLOODU Negative 10/04/2023 01:16 PM    GLUCOSEU >=1000 10/04/2023 01:16 PM    KETUA Negative 10/04/2023 01:16 PM         Radiology:  Reviewed as available.    Assessment/Plan:    CKD stage 3B sus CRS  No updaed labs   Creat ~ 1.4   --> 1.6 --> 1.4 --> 1.4--> 1.6 mild increase , could be hemodynamic changes   Neg basic work up for GN   No proteinuria of hemturia: risk of GN low   No proteinuria   Mild edema - chronic   No NSAIDS  On low sodium diet   Off  Metfrmin   Off PPU   FLC SPEP- normal   Likley CRS   Plan:  No need for RAASi  No changes in meds   Labs before next visit      H/o CHF but EF improved, CAD  Mild edema   Keep  torsemide  crrent dose   Monitor BP   Low sodium diet  No NSAIDS       H/o adrenal nodule . CT scan in 2020  MRI- benign nodsule   BP controlled     HTN -now controlled   BP controlled   Spironolactone - no changes   Keep Torsemide    Discussed low salt diet  No NSAIDS  Counseling done for diet and lifestyle changes.  Explained importance of compliance  with medications     Omega Salvia, MD  Nephrology     [facility] Office : [address], [address] , [place]  [address]  [facility] Office: [address], [place], [place] [address]  [facility] Office: [address] [place] [address].  [facility] Office: [address] [place] [address]  Office : [phone]  Fax [phone]     Discussed w/ MD- Metformin discontinued

## 2024-12-19 NOTE — Patient Instructions (Signed)
"  Repeat labs in 3    Next appointment in 3  months. If labs needed, do within one week of the visit     Please avoid NSAIDs medications  - e.g. Advil , motrin , ibuprofen , aleve, diclofenac , mobic , celebrex , nabumetone.etc: they can harm your kidneys    Let me know of concerning symptoms (including UTI symptoms).    5.   Additional tests if needed:    "
